# Patient Record
Sex: Female | Born: 1985 | Race: White | Hispanic: No | Marital: Married | Smoking: Never smoker
Health system: Southern US, Community
[De-identification: ages and names within clinical notes are randomized; demographics above are authoritative.]

## PROBLEM LIST (undated history)

## (undated) ENCOUNTER — Emergency Department (HOSPITAL_COMMUNITY): Admission: EM | Payer: BC Managed Care – PPO | Source: Home / Self Care

## (undated) DIAGNOSIS — F1021 Alcohol dependence, in remission: Secondary | ICD-10-CM

## (undated) DIAGNOSIS — J309 Allergic rhinitis, unspecified: Secondary | ICD-10-CM

## (undated) DIAGNOSIS — R509 Fever, unspecified: Secondary | ICD-10-CM

## (undated) DIAGNOSIS — K828 Other specified diseases of gallbladder: Secondary | ICD-10-CM

## (undated) DIAGNOSIS — R519 Headache, unspecified: Secondary | ICD-10-CM

## (undated) DIAGNOSIS — G629 Polyneuropathy, unspecified: Secondary | ICD-10-CM

## (undated) DIAGNOSIS — J4550 Severe persistent asthma, uncomplicated: Secondary | ICD-10-CM

## (undated) DIAGNOSIS — Z86718 Personal history of other venous thrombosis and embolism: Secondary | ICD-10-CM

## (undated) DIAGNOSIS — Z9109 Other allergy status, other than to drugs and biological substances: Secondary | ICD-10-CM

## (undated) DIAGNOSIS — I4581 Long QT syndrome: Secondary | ICD-10-CM

## (undated) DIAGNOSIS — D649 Anemia, unspecified: Secondary | ICD-10-CM

## (undated) DIAGNOSIS — K219 Gastro-esophageal reflux disease without esophagitis: Secondary | ICD-10-CM

## (undated) DIAGNOSIS — I10 Essential (primary) hypertension: Secondary | ICD-10-CM

## (undated) DIAGNOSIS — R51 Headache: Secondary | ICD-10-CM

## (undated) DIAGNOSIS — I5030 Unspecified diastolic (congestive) heart failure: Secondary | ICD-10-CM

## (undated) DIAGNOSIS — Z8719 Personal history of other diseases of the digestive system: Secondary | ICD-10-CM

## (undated) DIAGNOSIS — Z87898 Personal history of other specified conditions: Secondary | ICD-10-CM

## (undated) DIAGNOSIS — Z8619 Personal history of other infectious and parasitic diseases: Secondary | ICD-10-CM

## (undated) HISTORY — DX: Essential (primary) hypertension: I10

## (undated) HISTORY — PX: TYMPANOSTOMY TUBE PLACEMENT: SHX32

## (undated) HISTORY — DX: Headache: R51

## (undated) HISTORY — DX: Headache, unspecified: R51.9

---

## 2014-04-08 ENCOUNTER — Emergency Department (HOSPITAL_COMMUNITY): Payer: BC Managed Care – PPO

## 2014-04-08 ENCOUNTER — Observation Stay (HOSPITAL_COMMUNITY)
Admission: EM | Admit: 2014-04-08 | Discharge: 2014-04-09 | Disposition: A | Discharge status: Home / Self Care | Payer: BC Managed Care – PPO | Attending: Internal Medicine | Admitting: Internal Medicine

## 2014-04-08 ENCOUNTER — Encounter (HOSPITAL_COMMUNITY): Payer: Self-pay | Admitting: *Deleted

## 2014-04-08 DIAGNOSIS — R9431 Abnormal electrocardiogram [ECG] [EKG]: Secondary | ICD-10-CM | POA: Diagnosis present

## 2014-04-08 DIAGNOSIS — R7989 Other specified abnormal findings of blood chemistry: Secondary | ICD-10-CM | POA: Diagnosis not present

## 2014-04-08 DIAGNOSIS — E876 Hypokalemia: Secondary | ICD-10-CM | POA: Diagnosis not present

## 2014-04-08 DIAGNOSIS — F1010 Alcohol abuse, uncomplicated: Secondary | ICD-10-CM | POA: Insufficient documentation

## 2014-04-08 DIAGNOSIS — I4581 Long QT syndrome: Secondary | ICD-10-CM | POA: Diagnosis not present

## 2014-04-08 DIAGNOSIS — R55 Syncope and collapse: Principal | ICD-10-CM | POA: Insufficient documentation

## 2014-04-08 DIAGNOSIS — K219 Gastro-esophageal reflux disease without esophagitis: Secondary | ICD-10-CM | POA: Diagnosis not present

## 2014-04-08 DIAGNOSIS — R000 Tachycardia, unspecified: Secondary | ICD-10-CM | POA: Diagnosis not present

## 2014-04-08 DIAGNOSIS — J45909 Unspecified asthma, uncomplicated: Secondary | ICD-10-CM | POA: Diagnosis not present

## 2014-04-08 DIAGNOSIS — Z87891 Personal history of nicotine dependence: Secondary | ICD-10-CM | POA: Insufficient documentation

## 2014-04-08 DIAGNOSIS — R112 Nausea with vomiting, unspecified: Secondary | ICD-10-CM | POA: Insufficient documentation

## 2014-04-08 DIAGNOSIS — R197 Diarrhea, unspecified: Secondary | ICD-10-CM | POA: Insufficient documentation

## 2014-04-08 DIAGNOSIS — T671XXD Heat syncope, subsequent encounter: Secondary | ICD-10-CM

## 2014-04-08 DIAGNOSIS — R945 Abnormal results of liver function studies: Secondary | ICD-10-CM

## 2014-04-08 HISTORY — DX: Gastro-esophageal reflux disease without esophagitis: K21.9

## 2014-04-08 HISTORY — DX: Hypokalemia: E87.6

## 2014-04-08 HISTORY — DX: Other allergy status, other than to drugs and biological substances: Z91.09

## 2014-04-08 LAB — CBC WITH DIFFERENTIAL/PLATELET
Basophils Absolute: 0.1 10*3/uL (ref 0.0–0.1)
Basophils Relative: 1 % (ref 0–1)
Eosinophils Absolute: 0.1 10*3/uL (ref 0.0–0.7)
Eosinophils Relative: 1 % (ref 0–5)
HCT: 41.9 % (ref 36.0–46.0)
HEMOGLOBIN: 13.8 g/dL (ref 12.0–15.0)
Lymphocytes Relative: 18 % (ref 12–46)
Lymphs Abs: 1.5 10*3/uL (ref 0.7–4.0)
MCH: 31.3 pg (ref 26.0–34.0)
MCHC: 32.9 g/dL (ref 30.0–36.0)
MCV: 95 fL (ref 78.0–100.0)
MONOS PCT: 6 % (ref 3–12)
Monocytes Absolute: 0.5 10*3/uL (ref 0.1–1.0)
Neutro Abs: 6.1 10*3/uL (ref 1.7–7.7)
Neutrophils Relative %: 74 % (ref 43–77)
PLATELETS: 366 10*3/uL (ref 150–400)
RBC: 4.41 MIL/uL (ref 3.87–5.11)
RDW: 15.6 % — ABNORMAL HIGH (ref 11.5–15.5)
WBC: 8.4 10*3/uL (ref 4.0–10.5)

## 2014-04-08 LAB — URINALYSIS, ROUTINE W REFLEX MICROSCOPIC
GLUCOSE, UA: NEGATIVE mg/dL
Ketones, ur: 15 mg/dL — AB
Leukocytes, UA: NEGATIVE
NITRITE: NEGATIVE
PH: 6 (ref 5.0–8.0)
PROTEIN: 100 mg/dL — AB
Specific Gravity, Urine: 1.028 (ref 1.005–1.030)
UROBILINOGEN UA: 1 mg/dL (ref 0.0–1.0)

## 2014-04-08 LAB — TROPONIN I

## 2014-04-08 LAB — POC URINE PREG, ED: Preg Test, Ur: NEGATIVE

## 2014-04-08 LAB — COMPREHENSIVE METABOLIC PANEL
ALK PHOS: 144 U/L — AB (ref 39–117)
ALT: 79 U/L — ABNORMAL HIGH (ref 0–35)
ANION GAP: 22 — AB (ref 5–15)
AST: 250 U/L — ABNORMAL HIGH (ref 0–37)
Albumin: 3.5 g/dL (ref 3.5–5.2)
BUN: 5 mg/dL — AB (ref 6–23)
CO2: 21 mEq/L (ref 19–32)
CREATININE: 0.7 mg/dL (ref 0.50–1.10)
Calcium: 9.2 mg/dL (ref 8.4–10.5)
Chloride: 97 mEq/L (ref 96–112)
GFR calc Af Amer: >90 mL/min (ref >90)
GFR calc non Af Amer: >90 mL/min (ref >90)
Glucose, Bld: 108 mg/dL — ABNORMAL HIGH (ref 70–99)
POTASSIUM: 3.6 meq/L — AB (ref 3.7–5.3)
Sodium: 140 mEq/L (ref 137–147)
TOTAL PROTEIN: 7.6 g/dL (ref 6.0–8.3)
Total Bilirubin: 0.6 mg/dL (ref 0.3–1.2)

## 2014-04-08 LAB — RAPID URINE DRUG SCREEN, HOSP PERFORMED
AMPHETAMINES: NOT DETECTED
Barbiturates: NOT DETECTED
Benzodiazepines: NOT DETECTED
COCAINE: NOT DETECTED
OPIATES: NOT DETECTED
Tetrahydrocannabinol: NOT DETECTED

## 2014-04-08 LAB — MAGNESIUM: Magnesium: 1.5 mg/dL (ref 1.5–2.5)

## 2014-04-08 LAB — URINE MICROSCOPIC-ADD ON

## 2014-04-08 LAB — ETHANOL: Alcohol, Ethyl (B): <11 mg/dL (ref 0–11)

## 2014-04-08 LAB — LIPASE, BLOOD: LIPASE: 44 U/L (ref 11–59)

## 2014-04-08 LAB — PREGNANCY, URINE: PREG TEST UR: NEGATIVE

## 2014-04-08 MED ORDER — MOMETASONE FURO-FORMOTEROL FUM 200-5 MCG/ACT IN AERO
2.0000 | INHALATION_SPRAY | Freq: Two times a day (BID) | RESPIRATORY_TRACT | Status: DC
  Administered 2014-04-08 – 2014-04-09 (×2): 2 via RESPIRATORY_TRACT
  Filled 2014-04-08: qty 8.8

## 2014-04-08 MED ORDER — LORAZEPAM 2 MG/ML IJ SOLN: 1.0000 mg | Freq: Four times a day (QID) | INTRAMUSCULAR | Status: DC | PRN

## 2014-04-08 MED ORDER — PANTOPRAZOLE SODIUM 40 MG PO TBEC: 40.0000 mg | DELAYED_RELEASE_TABLET | Freq: Every day | ORAL | Status: DC

## 2014-04-08 MED ORDER — PNEUMOCOCCAL VAC POLYVALENT 25 MCG/0.5ML IJ INJ
0.5000 mL | INJECTION | INTRAMUSCULAR | Status: AC
  Administered 2014-04-09: 0.5 mL via INTRAMUSCULAR
  Filled 2014-04-08 (×2): qty 0.5

## 2014-04-08 MED ORDER — CHLORHEXIDINE GLUCONATE 0.12 % MT SOLN
15.0000 mL | Freq: Two times a day (BID) | OROMUCOSAL | Status: DC
  Administered 2014-04-08 – 2014-04-09 (×2): 15 mL via OROMUCOSAL
  Filled 2014-04-08 (×4): qty 15

## 2014-04-08 MED ORDER — ALUM & MAG HYDROXIDE-SIMETH 200-200-20 MG/5ML PO SUSP: 30.0000 mL | Freq: Four times a day (QID) | ORAL | Status: DC | PRN

## 2014-04-08 MED ORDER — THIAMINE HCL 100 MG/ML IJ SOLN
100.0000 mg | Freq: Every day | INTRAMUSCULAR | Status: DC
  Filled 2014-04-08 (×2): qty 1

## 2014-04-08 MED ORDER — LORAZEPAM 1 MG PO TABS
0.0000 mg | ORAL_TABLET | Freq: Two times a day (BID) | ORAL | Status: DC
Start: 2014-04-10 — End: 2014-04-09
  Administered 2014-04-08: 2 mg via ORAL

## 2014-04-08 MED ORDER — ACETAMINOPHEN 650 MG RE SUPP: 650.0000 mg | Freq: Four times a day (QID) | RECTAL | Status: DC | PRN

## 2014-04-08 MED ORDER — MAGNESIUM SULFATE 2 GM/50ML IV SOLN
2.0000 g | Freq: Once | INTRAVENOUS | Status: AC
  Administered 2014-04-08: 2 g via INTRAVENOUS
  Filled 2014-04-08: qty 50

## 2014-04-08 MED ORDER — VITAMIN B-1 100 MG PO TABS
100.0000 mg | ORAL_TABLET | Freq: Every day | ORAL | Status: DC
  Administered 2014-04-08 – 2014-04-09 (×2): 100 mg via ORAL
  Filled 2014-04-08 (×2): qty 1

## 2014-04-08 MED ORDER — SODIUM CHLORIDE 0.9 % IV BOLUS (SEPSIS)
1000.0000 mL | Freq: Once | INTRAVENOUS | Status: AC
  Administered 2014-04-08: 1000 mL via INTRAVENOUS

## 2014-04-08 MED ORDER — ALBUTEROL SULFATE (2.5 MG/3ML) 0.083% IN NEBU: 2.5000 mg | INHALATION_SOLUTION | Freq: Four times a day (QID) | RESPIRATORY_TRACT | Status: DC | PRN

## 2014-04-08 MED ORDER — PROMETHAZINE HCL 25 MG/ML IJ SOLN: 12.5000 mg | Freq: Four times a day (QID) | INTRAMUSCULAR | Status: DC | PRN

## 2014-04-08 MED ORDER — HEPARIN SODIUM (PORCINE) 5000 UNIT/ML IJ SOLN
5000.0000 [IU] | Freq: Three times a day (TID) | INTRAMUSCULAR | Status: DC
  Administered 2014-04-08 – 2014-04-09 (×3): 5000 [IU] via SUBCUTANEOUS
  Filled 2014-04-08 (×5): qty 1

## 2014-04-08 MED ORDER — ONDANSETRON HCL 4 MG PO TABS: 4.0000 mg | ORAL_TABLET | Freq: Four times a day (QID) | ORAL | Status: DC | PRN

## 2014-04-08 MED ORDER — CETYLPYRIDINIUM CHLORIDE 0.05 % MT LIQD
7.0000 mL | Freq: Two times a day (BID) | OROMUCOSAL | Status: DC
  Administered 2014-04-09: 7 mL via OROMUCOSAL

## 2014-04-08 MED ORDER — ADULT MULTIVITAMIN W/MINERALS CH
1.0000 | ORAL_TABLET | Freq: Every day | ORAL | Status: DC
  Administered 2014-04-08 – 2014-04-09 (×2): 1 via ORAL
  Filled 2014-04-08 (×2): qty 1

## 2014-04-08 MED ORDER — PANTOPRAZOLE SODIUM 40 MG PO TBEC
80.0000 mg | DELAYED_RELEASE_TABLET | Freq: Every day | ORAL | Status: DC
  Administered 2014-04-09: 80 mg via ORAL
  Filled 2014-04-08: qty 2

## 2014-04-08 MED ORDER — FLUTICASONE PROPIONATE HFA 44 MCG/ACT IN AERO
1.0000 | INHALATION_SPRAY | Freq: Two times a day (BID) | RESPIRATORY_TRACT | Status: DC
  Administered 2014-04-09: 1 via RESPIRATORY_TRACT
  Filled 2014-04-08: qty 10.6

## 2014-04-08 MED ORDER — FOLIC ACID 1 MG PO TABS
1.0000 mg | ORAL_TABLET | Freq: Every day | ORAL | Status: DC
  Administered 2014-04-08 – 2014-04-09 (×2): 1 mg via ORAL
  Filled 2014-04-08 (×2): qty 1

## 2014-04-08 MED ORDER — ONDANSETRON HCL 4 MG/2ML IJ SOLN
4.0000 mg | Freq: Once | INTRAMUSCULAR | Status: AC
  Administered 2014-04-08: 4 mg via INTRAVENOUS
  Filled 2014-04-08: qty 2

## 2014-04-08 MED ORDER — LORAZEPAM 1 MG PO TABS: 1.0000 mg | ORAL_TABLET | Freq: Four times a day (QID) | ORAL | Status: DC | PRN

## 2014-04-08 MED ORDER — MONTELUKAST SODIUM 10 MG PO TABS
10.0000 mg | ORAL_TABLET | Freq: Every day | ORAL | Status: DC
  Administered 2014-04-08: 10 mg via ORAL
  Filled 2014-04-08 (×2): qty 1

## 2014-04-08 MED ORDER — ALBUTEROL SULFATE HFA 108 (90 BASE) MCG/ACT IN AERS: 2.0000 | INHALATION_SPRAY | Freq: Four times a day (QID) | RESPIRATORY_TRACT | Status: DC | PRN

## 2014-04-08 MED ORDER — SODIUM CHLORIDE 0.9 % IJ SOLN
3.0000 mL | Freq: Two times a day (BID) | INTRAMUSCULAR | Status: DC
  Administered 2014-04-08: 3 mL via INTRAVENOUS

## 2014-04-08 MED ORDER — ACETAMINOPHEN 325 MG PO TABS: 650.0000 mg | ORAL_TABLET | Freq: Four times a day (QID) | ORAL | Status: DC | PRN

## 2014-04-08 MED ORDER — CHLORHEXIDINE GLUCONATE 0.12 % MT SOLN
10.0000 mL | Freq: Every day | OROMUCOSAL | Status: DC
  Filled 2014-04-08: qty 15

## 2014-04-08 MED ORDER — PROMETHAZINE HCL 25 MG PO TABS: 12.5000 mg | ORAL_TABLET | Freq: Four times a day (QID) | ORAL | Status: DC | PRN

## 2014-04-08 MED ORDER — LORAZEPAM 2 MG/ML IJ SOLN
1.0000 mg | Freq: Once | INTRAMUSCULAR | Status: AC
  Administered 2014-04-08: 1 mg via INTRAVENOUS
  Filled 2014-04-08: qty 1

## 2014-04-08 MED ORDER — LORAZEPAM 1 MG PO TABS
0.0000 mg | ORAL_TABLET | Freq: Four times a day (QID) | ORAL | Status: DC
  Administered 2014-04-08: 2 mg via ORAL
  Filled 2014-04-08 (×2): qty 2

## 2014-04-08 MED ORDER — SODIUM CHLORIDE 0.9 % IV SOLN: INTRAVENOUS | Status: DC

## 2014-04-08 MED ORDER — POTASSIUM CHLORIDE IN NACL 40-0.9 MEQ/L-% IV SOLN
INTRAVENOUS | Status: DC
  Administered 2014-04-08: 50 mL/h via INTRAVENOUS
  Filled 2014-04-08 (×2): qty 1000

## 2014-04-08 MED ORDER — ONDANSETRON HCL 4 MG/2ML IJ SOLN: 4.0000 mg | Freq: Four times a day (QID) | INTRAMUSCULAR | Status: DC | PRN

## 2014-04-08 NOTE — ED Notes (Signed)
Attempted to call report, RN unavailable.

## 2014-04-08 NOTE — ED Provider Notes (Signed)
CSN: [id number]     Arrival date & time 04/08/14  1315 History   First MD Initiated Contact with Patient 04/08/14 1350     Chief Complaint  Patient presents with  . Loss of Consciousness  . ETOH abuse      (Consider location/radiation/quality/duration/timing/severity/associated sxs/prior Treatment) Patient is a 28 y.o. female presenting with syncope.  Loss of Consciousness Episode history:  Single Most recent episode:  Today Duration: very brief. Timing:  Constant Progression:  Unchanged Chronicity:  Recurrent Context: dehydration   Context: not blood draw, not inactivity, not medication change and not sight of blood   Context comment:  Diarrhea, chronic ETOH abuse, no ETOH today Witnessed: no   Relieved by:  Lying down Worsened by:  Nothing tried Ineffective treatments:  None tried Associated symptoms: nausea and vomiting   Associated symptoms: no anxiety, no diaphoresis, no dizziness, no fever, no focal weakness, no recent fall, no seizures and no shortness of breath     Past Medical History  Diagnosis Date  . Asthma   . GERD (gastroesophageal reflux disease)   . Alcohol abuse    History reviewed. No pertinent past surgical history. History reviewed. No pertinent family history. History  Substance Use Topics  . Smoking status: Former Smoker -- 0.50 packs/day for 2 years    Types: Cigarettes  . Smokeless tobacco: Not on file  . Alcohol Use: Yes     Comment: ETOH abuse, 8 shots/ day   OB History    No data available     Review of Systems  Constitutional: Negative for fever and diaphoresis.  Respiratory: Negative for shortness of breath.   Cardiovascular: Positive for syncope.  Gastrointestinal: Positive for nausea and vomiting.  Neurological: Negative for dizziness, focal weakness and seizures.  All other systems reviewed and are negative.     Allergies  Review of patient's allergies indicates no known allergies.  Home Medications   Prior to  Admission medications   Medication Sig Start Date End Date Taking? Authorizing Provider  albuterol (PROVENTIL HFA;VENTOLIN HFA) 108 (90 BASE) MCG/ACT inhaler Inhale 2 puffs into the lungs every 6 (six) hours as needed for wheezing or shortness of breath.   Yes Historical Provider, MD  beclomethasone (QVAR) 80 MCG/ACT inhaler Inhale 2 puffs into the lungs 2 (two) times daily.   Yes Historical Provider, MD  chlorhexidine (PERIDEX) 0.12 % solution Use as directed 10 mLs in the mouth or throat daily.   Yes Historical Provider, MD  mometasone-formoterol (DULERA) 200-5 MCG/ACT AERO Inhale 2 puffs into the lungs 2 (two) times daily.   Yes Historical Provider, MD  montelukast (SINGULAIR) 10 MG tablet Take 10 mg by mouth at bedtime.   Yes Historical Provider, MD  omeprazole (PRILOSEC) 40 MG capsule Take 40 mg by mouth daily.   Yes Historical Provider, MD   BP 114/82 mmHg  Pulse 137  Temp(Src) 97.8 F (36.6 C) (Oral)  Resp 18  SpO2 100%  LMP 03/18/2014 Physical Exam  Constitutional: She is oriented to person, place, and time. She appears well-developed and well-nourished.  HENT:  Head: Normocephalic and atraumatic.  Right Ear: External ear normal.  Left Ear: External ear normal.  Eyes: Conjunctivae and EOM are normal. Pupils are equal, round, and reactive to light.  Neck: Normal range of motion. Neck supple.  Cardiovascular: Regular rhythm, normal heart sounds and intact distal pulses.  Tachycardia present.   Pulmonary/Chest: Effort normal and breath sounds normal.  Abdominal: Soft. Bowel sounds are normal. There is no  tenderness.  Musculoskeletal: Normal range of motion.  Neurological: She is alert and oriented to person, place, and time.  Skin: Skin is warm and dry.  Vitals reviewed.   ED Course  Procedures (including critical care time) Labs Review Labs Reviewed  CBC WITH DIFFERENTIAL - Abnormal; Notable for the following:    RDW 15.6 (*)    All other components within normal limits   COMPREHENSIVE METABOLIC PANEL - Abnormal; Notable for the following:    Potassium 3.6 (*)    Glucose, Bld 108 (*)    BUN 5 (*)    AST 250 (*)    ALT 79 (*)    Alkaline Phosphatase 144 (*)    Anion gap 22 (*)    All other components within normal limits  LIPASE, BLOOD  ETHANOL  URINALYSIS, ROUTINE W REFLEX MICROSCOPIC  PREGNANCY, URINE  TROPONIN I  POC URINE PREG, ED    Imaging Review Dg Chest 2 View  04/08/2014   CLINICAL DATA:  Syncope.  Dizziness and weakness.  EXAM: CHEST  2 VIEW  COMPARISON:  None.  FINDINGS: The cardiomediastinal contours are normal. The lungs are clear. Pulmonary vasculature is normal. No consolidation, pleural effusion, or pneumothorax. No acute osseous abnormalities are seen.  IMPRESSION: No acute pulmonary process.   Electronically Signed   By: Rubye OaksMelanie  Ehinger M.D.   On: 04/08/2014 14:39     EKG Interpretation   Date/Time:  Monday April 08 2014 13:39:38 EST Ventricular Rate:  131 PR Interval:  136 QRS Duration: 97 QT Interval:  434 QTC Calculation: 641 R Axis:   82 Text Interpretation:  Sinus tachycardia Consider right atrial enlargement  Low voltage, precordial leads Prolonged QT interval No old tracing to  compare Confirmed by Mirian MoGentry, Matthew [phone]([id number]) on 04/08/2014 3:09:40 PM      MDM   Final diagnoses:  Syncope    28 y.o. female with pertinent PMH of chronic etoh use, prior syncope presents with recurrent syncopal event. Patient drinks at least 8 shots of alcohol day, drank her normal amount last night. She normally does not drink in the morning. On arrival the patient is essentially asymptomatic with the exception of feeling tremulous.  Vital signs and physical exam as above. No focal neurodeficits.  Orthostatics negative.  HR still 110-130.  QTC >600.  2 gm mag given.  No response to ativan or 1L NS.  No family ho sudden death.  Admitted in stable condition.    I have reviewed all laboratory and imaging studies if ordered as  above  1. Syncope    2. Prolonged QT     Mirian MoMatthew Gentry, MD 04/08/14 1623

## 2014-04-08 NOTE — H&P (Signed)
History and Physical:    Kara [name] ZOX:[id number] DOB: 02-14-1986 DOA: 04/08/2014  Referring physician: Dr. Littie Deeds PCP: Zelphia Cairo, MD   Chief Complaint: Syncope/diarrhea  History of Present Illness:   Kara [name] is an 28 y.o. female with a PMH of ETOH abuse and asthma who was brought to the hospital by her husband after having a syncopal episode after walking to the bathroom (had some nausea).  She feels like she blacked out for a few seconds.  No prodromal symptoms except for the sudden onset of nausea.  Vomited after coming to.  No complaints of chest pains, shortness of breath or palpitations.  The patient denies any recent medication changes, no recent similar symptoms. There is no family history of sudden death. She does report an episode of hematochezia over a week ago, and has not had any since that time. Upon initial evaluation in the ED, the patient's heart rate is 110-130 with a QTC greater than 600. Hemoglobin is normal at 13.8.  ROS:   Constitutional: + fever, + chills;  Appetite diminished; No weight loss, no weight gain, + fatigue.  HEENT: No blurry vision, no diplopia, no pharyngitis, no dysphagia CV: No chest pain, no palpitations, no PND, no orthopnea, + edema.  Resp: No SOB, no cough, no pleuritic pain. GI: + nausea, + vomiting, no diarrhea, no melena, + hematochezia last week (none now), no constipation, no abdominal pain.  GU: No dysuria, no hematuria, no frequency, no urgency. MSK: no myalgias, no arthralgias, +leg pain.  Neuro:  No headache, no focal neurological deficits, no history of seizures.  Psych: No depression, + anxiety.  Endo: No heat intolerance, no cold intolerance, no polyuria, no polydipsia  Skin: No rashes, no skin lesions.  Heme: + easy bruising.  Travel history: No recent travel except to [place], [place] and [place].   Past Medical History:   Past Medical History  Diagnosis Date  . Asthma   . GERD (gastroesophageal reflux disease)   . Alcohol abuse     . Environmental allergies     Past Surgical History:   Past Surgical History  Procedure Laterality Date  . Tympanostomy tube placement      Social History:   History   Social History  . Marital Status: Married    Spouse Name: [name]    Number of Children: 0  . Years of Education: N/A   Occupational History  . College advisor    Social History Main Topics  . Smoking status: Former Smoker -- 0.50 packs/day for 2 years    Types: Cigarettes    Quit date: 04/08/2012  . Smokeless tobacco: Never Used  . Alcohol Use: 0.0 oz/week    0 Not specified per week     Comment: ETOH abuse, 8 shots/ day  . Drug Use: No  . Sexual Activity: Yes    Birth Control/ Protection: None   Other Topics Concern  . Not on file   Social History Narrative   Married.    Family history:   Family History  Problem Relation Age of Onset  . Heart failure Mother   . Heart failure Father   . Heart failure Maternal Grandfather     Allergies   Review of patient's allergies indicates no known allergies.  Current Medications:   Prior to Admission medications   Medication Sig Start Date End Date Taking? Authorizing Provider  albuterol (PROVENTIL HFA;VENTOLIN HFA) 108 (90 BASE) MCG/ACT inhaler Inhale 2 puffs into the lungs every 6 (six)  hours as needed for wheezing or shortness of breath.   Yes Historical Provider, MD  beclomethasone (QVAR) 80 MCG/ACT inhaler Inhale 2 puffs into the lungs 2 (two) times daily.   Yes Historical Provider, MD  chlorhexidine (PERIDEX) 0.12 % solution Use as directed 10 mLs in the mouth or throat daily.   Yes Historical Provider, MD  mometasone-formoterol (DULERA) 200-5 MCG/ACT AERO Inhale 2 puffs into the lungs 2 (two) times daily.   Yes Historical Provider, MD  montelukast (SINGULAIR) 10 MG tablet Take 10 mg by mouth at bedtime.   Yes Historical Provider, MD  omeprazole (PRILOSEC) 40 MG capsule Take 40 mg by mouth daily.   Yes Historical Provider, MD    Physical  Exam:   Filed Vitals:   04/08/14 1400 04/08/14 1530 04/08/14 1604 04/08/14 1630  BP: 160/106 134/76 114/82 138/86  Pulse: 125 120 137 131  Temp:      TempSrc:      Resp: 14 18 18 22   SpO2: 99% 100% 100% 100%     Physical Exam: Blood pressure 138/86, pulse 131, temperature 97.8 F (36.6 C), temperature source Oral, resp. rate 22, last menstrual period 03/18/2014, SpO2 100 %. Gen: No acute distress. Head: Normocephalic, atraumatic. Eyes: PERRL, EOMI, sclerae nonicteric. Mouth: Oropharynx reveals dry mucous membranes and a white coating to the tongue. Tonsillar enlargement noted. Neck: Supple, no thyromegaly, no lymphadenopathy, no jugular venous distention. Chest: Lungs clear to auscultation bilaterally. CV: Heart sounds are tachycardic, regular, without obvious murmur. Abdomen: Soft, nontender, nondistended with normal active bowel sounds. Extremities: Extremities are without clubbing, edema, or cyanosis. Skin: Warm and dry. Tattoos present. Neuro: Alert and oriented times 3; cranial nerves II through XII grossly intact. Psych: Mood and affect mildly anxious.   Data Review:    Labs: Basic Metabolic Panel:  Recent Labs Lab 04/08/14 1446  NA 140  K 3.6*  CL 97  CO2 21  GLUCOSE 108*  BUN 5*  CREATININE 0.70  CALCIUM 9.2   Liver Function Tests:  Recent Labs Lab 04/08/14 1446  AST 250*  ALT 79*  ALKPHOS 144*  BILITOT 0.6  PROT 7.6  ALBUMIN 3.5    Recent Labs Lab 04/08/14 1446  LIPASE 44   CBC:  Recent Labs Lab 04/08/14 1446  WBC 8.4  NEUTROABS 6.1  HGB 13.8  HCT 41.9  MCV 95.0  PLT 366   Cardiac Enzymes:  Recent Labs Lab 04/08/14 1445  TROPONINI <0.30    Radiographic Studies: Dg Chest 2 View  04/08/2014   CLINICAL DATA:  Syncope.  Dizziness and weakness.  EXAM: CHEST  2 VIEW  COMPARISON:  None.  FINDINGS: The cardiomediastinal contours are normal. The lungs are clear. Pulmonary vasculature is normal. No consolidation, pleural  effusion, or pneumothorax. No acute osseous abnormalities are seen.  IMPRESSION: No acute pulmonary process.   Electronically Signed   By: Rubye OaksMelanie  Ehinger M.D.   On: 04/08/2014 14:39    EKG: Independently reviewed. Sinus tachycardia with a heart rate of 131 bpm. Prolonged QT interval greater than 600 ms. No old tracing to compare   Assessment/Plan:   Principal Problem:   Syncope / Prolonged Q-T interval on ECG  Admit to telemetry and monitor for arrhythmias.  Pharmacy consultation to evaluate for medication interactions that may prolong QT interval.  Check orthostatics.  Check urine drug screen.  Check serum magnesium. Given magnesium sulfate by ED physician.  Active Problems:   Possible thrush  Check HIV status.    Hypokalemia  Potassium  added to IV fluids.    Alcohol abuse  Patient denies a history of withdrawal.  Placed on Ativan detox CIWA protocol.  Supplement thiamine and folic acid.    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  Check acute hepatitis serologies.    DVT prophylaxis  Subcutaneous heparin ordered.  Code Status: Full. Family Communication: Arville Goathaniel Ladson, husband, at bedside. Disposition Plan: Home when stable.  Time spent: 1 hour.  Janit Cutter [facility] Pager [phone] Cell: [phone]   If 7PM-7AM, please contact night-coverage [email or web address] Password [email or web address] 04/08/2014, 5:05 PM

## 2014-04-08 NOTE — Progress Notes (Signed)
MEDICATION RELATED CONSULT NOTE - INITIAL   Pharmacy Consult for evaluate medication possibly causing QT prolongation  No Known Allergies  Patient Measurements:   Adjusted Body Weight:   Vital Signs: Temp: 97.8 F (36.6 C) (12/21 1330) Temp Source: Oral (12/21 1330) BP: 138/86 mmHg (12/21 1630) Pulse Rate: 131 (12/21 1630) Intake/Output from previous day:   Intake/Output from this shift:    Labs:  Recent Labs  04/08/14 1446  WBC 8.4  HGB 13.8  HCT 41.9  PLT 366  CREATININE 0.70  ALBUMIN 3.5  PROT 7.6  AST 250*  ALT 79*  ALKPHOS 144*  BILITOT 0.6   CrCl cannot be calculated (Unknown ideal weight.).   Microbiology: No results found for this or any previous visit (from the past 720 hour(s)).  Medical History: Past Medical History  Diagnosis Date  . Asthma   . GERD (gastroesophageal reflux disease)   . Alcohol abuse   . Environmental allergies     Assessment: [age] YOF presents with syncope and ED EKG with QTc  > 600ms.    Goal of Therapy:  Notify physician for meds that can prolong QTc  Plan:   Notify admitting physician that ondansetron can prolong QTc and consider alternative agent as appropriate  Juliette Alcideustin Charde Macfarlane, PharmD, BCPS.   Pager: [phone]  04/08/2014,5:35 PM

## 2014-04-08 NOTE — ED Notes (Addendum)
Pt reports ETOH abuse, drinks 8 shots/day x3 years. Last drink last night. Pt reports she had LOC today. Pt has hx of asthma, GERD, usually vomits in the morning, her doctor knows about this. This is not new. Pt has seen pcp about feet swelling since Sept, was given "fluid pills" which she no longer takes. Pt reports she started having tingling in  Her feet and legs x1 week. Denies pain. P

## 2014-04-09 DIAGNOSIS — R55 Syncope and collapse: Secondary | ICD-10-CM

## 2014-04-09 DIAGNOSIS — J45909 Unspecified asthma, uncomplicated: Secondary | ICD-10-CM | POA: Diagnosis not present

## 2014-04-09 DIAGNOSIS — F1010 Alcohol abuse, uncomplicated: Secondary | ICD-10-CM | POA: Diagnosis not present

## 2014-04-09 DIAGNOSIS — I4581 Long QT syndrome: Secondary | ICD-10-CM | POA: Diagnosis not present

## 2014-04-09 LAB — COMPREHENSIVE METABOLIC PANEL
ALBUMIN: 3.8 g/dL (ref 3.5–5.2)
ALT: 59 U/L — ABNORMAL HIGH (ref 0–35)
AST: 194 U/L — ABNORMAL HIGH (ref 0–37)
Alkaline Phosphatase: 135 U/L — ABNORMAL HIGH (ref 39–117)
Anion gap: 9 (ref 5–15)
BUN: 6 mg/dL (ref 6–23)
CALCIUM: 8.9 mg/dL (ref 8.4–10.5)
CO2: 26 mmol/L (ref 19–32)
CREATININE: 0.78 mg/dL (ref 0.50–1.10)
Chloride: 104 mEq/L (ref 96–112)
GFR calc Af Amer: >90 mL/min (ref >90)
GFR calc non Af Amer: >90 mL/min (ref >90)
Glucose, Bld: 100 mg/dL — ABNORMAL HIGH (ref 70–99)
Potassium: 3.3 mmol/L — ABNORMAL LOW (ref 3.5–5.1)
Sodium: 139 mmol/L (ref 135–145)
Total Bilirubin: 1.5 mg/dL — ABNORMAL HIGH (ref 0.3–1.2)
Total Protein: 7.5 g/dL (ref 6.0–8.3)

## 2014-04-09 LAB — CBC
HEMATOCRIT: 42.3 % (ref 36.0–46.0)
Hemoglobin: 13.7 g/dL (ref 12.0–15.0)
MCH: 31.3 pg (ref 26.0–34.0)
MCHC: 32.4 g/dL (ref 30.0–36.0)
MCV: 96.6 fL (ref 78.0–100.0)
PLATELETS: 332 10*3/uL (ref 150–400)
RBC: 4.38 MIL/uL (ref 3.87–5.11)
RDW: 15.7 % — AB (ref 11.5–15.5)
WBC: 7.2 10*3/uL (ref 4.0–10.5)

## 2014-04-09 LAB — VITAMIN B12: Vitamin B-12: 622 pg/mL (ref 211–911)

## 2014-04-09 LAB — HIV ANTIBODY (ROUTINE TESTING W REFLEX): HIV: NONREACTIVE

## 2014-04-09 MED ORDER — POTASSIUM CHLORIDE CRYS ER 20 MEQ PO TBCR
40.0000 meq | EXTENDED_RELEASE_TABLET | Freq: Once | ORAL | Status: AC
  Administered 2014-04-09: 40 meq via ORAL
  Filled 2014-04-09: qty 2

## 2014-04-09 NOTE — Progress Notes (Signed)
PT Cancellation Note  Patient Details Name: Kara [name] MRN: [id number] DOB: 10/10/1985   Cancelled Treatment:    Reason Eval/Treat Not Completed: PT screened, no needs identified, will sign off Pt reports no problems with mobility, up to bathroom without difficulty.  Pt does reports LE weakness however states has had this for a few months and possibly due to B12 vitamin deficiency.  Recommended pt return to MD if weakness continues after d/c.  Since pt declines PT at this time, will sign off.   Sircharles Holzheimer,KATHrine E 04/09/2014, 2:12 PM Zenovia JarredKati Elnora Quizon, PT, DPT 04/09/2014 Pager: [phone]

## 2014-04-09 NOTE — Progress Notes (Signed)
UR completed 

## 2014-04-09 NOTE — Progress Notes (Signed)
Patient given discharge instructions, and verbalized an understanding of all discharge instructions.  Patient agrees with discharge plan, and is being discharged in stable medical condition.  Patient ambulatory, and expressed desire to walk out with significant other.  Philomena Dohenyavid Eldene Plocher RN

## 2014-04-09 NOTE — Progress Notes (Signed)
Clinical Social Work  CSW received referral to assess for substance use. CSW went to room but patient currently in the bathroom. CSW will follow up at later time.  [place]ake SecessionHolly Raymone [place]roke, KentuckyLCSW [phone]

## 2014-04-09 NOTE — Discharge Instructions (Signed)
Near-Syncope °Near-syncope (commonly known as near fainting) is sudden weakness, dizziness, or feeling like you might pass out. This can happen when getting up or while standing for a long time. It is caused by a sudden decrease in blood flow to the brain, which can occur for various reasons. Most of the reasons are not serious.  °HOME CARE °Watch your condition for any changes. °· Have someone stay with you until you feel stable. °· If you feel like you are going to pass out: °¨ Lie down right away. °¨ Prop your feet up if you can. °¨ Breathe deeply and steadily. °¨ Move only when the feeling has gone away. Most of the time, this feeling lasts only a few minutes. You may feel tired for several hours. °· Drink enough fluids to keep your pee (urine) clear or pale yellow. °· If you are taking blood pressure or heart medicine, stand up slowly. °· Follow up with your doctor as told. °GET HELP RIGHT AWAY IF:  °· You have a severe headache. °· You have unusual pain in the chest, belly (abdomen), or back. °· You have bleeding from the mouth or butt (rectum), or you have black or tarry poop (stool). °· You feel your heart beat differently than normal, or you have a very fast pulse. °· You pass out, or you twitch and shake when you pass out. °· You pass out when sitting or lying down. °· You feel confused. °· You have trouble walking. °· You are weak. °· You have vision problems. °MAKE SURE YOU:  °· Understand these instructions. °· Will watch your condition. °· Will get help right away if you are not doing well or get worse. °Document Released: 09/22/2007 Document Revised: 04/10/2013 Document Reviewed: 09/08/2012 °[employer]® Patient Information ©2015 [employer]. This information is not intended to replace advice given to you by your health care provider. Make sure you discuss any questions you have with your health care provider. ° °

## 2014-04-09 NOTE — Discharge Summary (Signed)
Physician Discharge Summary  Kara [name] WUJ:[id number] DOB: 05/15/1985 DOA: 04/08/2014  PCP: Zelphia CairoADKINS,GRETCHEN, MD  Admit date: 04/08/2014 Discharge date: 04/09/2014  Recommendations for Outpatient Follow-up:  1. Pt will need to follow up with PCP in 2-3 weeks post discharge 2. Please obtain BMP to evaluate electrolytes and kidney function 3. Please also check CBC to evaluate Hg and Hct levels 4. Please note that pt insisted on going home today prior to having vit b 12 level back 5. Follow up on vit b12 and vit d level and supplement as indicated  6. Also please note that pt advised to stop drinking as this was likely causing some dizziness 7. Also pt with numbness and tingling which could be related to her alcohol use   Discharge Diagnoses:  Principal Problem:   Syncope Active Problems:   Prolonged Q-T interval on ECG   Hypokalemia   Alcohol abuse   Elevated LFTs  Discharge Condition: Stable  Diet recommendation: Heart healthy diet discussed in details   History of present illness:  28 y.o. female with a PMH of ETOH abuse and asthma who was brought to the hospital by her husband after having a syncopal episode after walking to the bathroom (had some nausea). She feels like she blacked out for a few seconds. No prodromal symptoms except for the sudden onset of nausea. Vomited after coming to the ED. No complaints of chest pains, shortness of breath or palpitations. The patient denies any recent medication changes, no recent similar symptoms. There is no family history of sudden death. She does report an episode of hematochezia over a week ago, and has not had any since that time. Upon initial evaluation in the ED, the patient's heart rate is 110-130 with a QTC greater than 600. Hemoglobin is normal at 13.8.  Hospital Course:  Principal Problem:   Syncope - unclear if related to prolonged QTc vs alcohol use  - pt ambulating in hallway with no problems - wants to go home -  referral to [facility] provided  Active Problems:   Prolonged Q-T interval on ECG - pt taking only zofran which could have prolonged QTc - pharmacy reviewed medications that pt takes at home and no other meds identified as potential contributor to prolonged QTc - this AM QTC in 400 - pt made aware of the risks associated with this and still wants to go home  - no other events on tele   Hypokalemia - mild, supplemented prior to discharge    Alcohol abuse - consultation on cessation provided    Elevated LFTs - from alcohol use - pt made aware - hep panel pending   Procedures/Studies: Dg Chest 2 View  04/08/2014  No acute pulmonary process.    Consultations:  None Antibiotics:  None   Discharge Exam: Filed Vitals:   04/09/14 1440  BP: 127/75  Pulse: 91  Temp: 98.7 F (37.1 C)  Resp: 20   Filed Vitals:   04/08/14 2240 04/09/14 0526 04/09/14 1101 04/09/14 1440  BP: 136/82 133/90  127/75  Pulse: 117 105  91  Temp: 98.7 F (37.1 C) 98.3 F (36.8 C)  98.7 F (37.1 C)  TempSrc: Oral Oral  Oral  Resp: 20 18  20   SpO2: 100% 100% 100% 100%    General: Pt is alert, follows commands appropriately, not in acute distress Cardiovascular: Regular rate and rhythm, S1/S2 +, no murmurs, no rubs, no gallops Respiratory: Clear to auscultation bilaterally, no wheezing, no crackles, no rhonchi Abdominal:  Soft, non tender, non distended, bowel sounds +, no guarding Extremities: no edema, no cyanosis, pulses palpable bilaterally DP and PT Neuro: Grossly nonfocal  Discharge Instructions  Discharge Instructions    Diet - low sodium heart healthy    Complete by:  As directed      Increase activity slowly    Complete by:  As directed             Medication List    TAKE these medications        albuterol 108 (90 BASE) MCG/ACT inhaler  Commonly known as:  PROVENTIL HFA;VENTOLIN HFA  Inhale 2 puffs into the lungs every 6 (six) hours as needed for wheezing  or shortness of breath.     chlorhexidine 0.12 % solution  Commonly known as:  PERIDEX  Use as directed 10 mLs in the mouth or throat daily.     DULERA 200-5 MCG/ACT Aero  Generic drug:  mometasone-formoterol  Inhale 2 puffs into the lungs 2 (two) times daily.     montelukast 10 MG tablet  Commonly known as:  SINGULAIR  Take 10 mg by mouth at bedtime.     omeprazole 40 MG capsule  Commonly known as:  PRILOSEC  Take 40 mg by mouth daily.     QVAR 80 MCG/ACT inhaler  Generic drug:  beclomethasone  Inhale 2 puffs into the lungs 2 (two) times daily.           Follow-up Information    Follow up with ADKINS,GRETCHEN, MD.   Specialty:  Obstetrics and Gynecology   Contact information:   [address] [place], [address] [place] [address] [phone]       Follow up with [facility].   Contact information:   [address] [place] [address] [phone]      Follow up with Debbora Presto, MD.   Specialty:  Internal Medicine   Why:  As needed, If symptoms worsen   Contact information:   [address] [place] [address] [phone]        The results of significant diagnostics from this hospitalization (including imaging, microbiology, ancillary and laboratory) are listed below for reference.     Microbiology: No results found for this or any previous visit (from the past 240 hour(s)).   Labs: Basic Metabolic Panel:  Recent Labs Lab 04/08/14 1446 04/08/14 1800 04/09/14 1015  NA 140  --  139  K 3.6*  --  3.3*  CL 97  --  104  CO2 21  --  26  GLUCOSE 108*  --  100*  BUN 5*  --  6  CREATININE 0.70  --  0.78  CALCIUM 9.2  --  8.9  MG  --  1.5  --    Liver Function Tests:  Recent Labs Lab 04/08/14 1446 04/09/14 1015  AST 250* 194*  ALT 79* 59*  ALKPHOS 144* 135*  BILITOT 0.6 1.5*  PROT 7.6 7.5  ALBUMIN 3.5 3.8    Recent Labs Lab 04/08/14 1446  LIPASE 44    CBC:  Recent Labs Lab 04/08/14 1446 04/09/14 1015  WBC 8.4 7.2  NEUTROABS 6.1  --   HGB 13.8 13.7  HCT 41.9 42.3  MCV 95.0 96.6  PLT 366 332   Cardiac Enzymes:  Recent Labs Lab 04/08/14 1445  TROPONINI <0.30     SIGNED: Time coordinating discharge: Over 30 minutes  Debbora Presto, MD  [facility] 04/09/2014, 3:18 PM  Pager [phone]  If 7PM-7AM, please contact night-coverage [email or web address] Password [email or web address]

## 2014-04-12 LAB — VITAMIN D 1,25 DIHYDROXY
VITAMIN D 1, 25 (OH) TOTAL: 69 pg/mL (ref 18–72)
VITAMIN D3 1, 25 (OH): 69 pg/mL

## 2014-05-03 LAB — HEPATITIS PANEL, ACUTE
HCV AB: NEGATIVE
HEP A IGM: NONREACTIVE
HEP B S AG: NEGATIVE
Hep B C IgM: NONREACTIVE

## 2014-06-10 NOTE — Progress Notes (Signed)
Patient ID: Kara [name], female   DOB: Oct 11, 1985, 29 y.o.   MRN: [id number]     Cardiology Office Note   Date:  06/10/2014   ID:  Nishat Livingston, DOB May 11, 1985, MRN [id number]  PCP:  Zelphia Cairo, MD  Cardiologist:   Charlton Haws, MD   No chief complaint on file.     History of Present Illness: Kara [name] is a 29 y.o. female who presents for palpitations and syncope.  Seen in hospital 12/15  PMH of ETOH abuse and asthma who was brought to the hospital by her husband after having a syncopal episode after walking to the bathroom (had some nausea). She feels like she blacked out for a few seconds. No prodromal symptoms except for the sudden onset of nausea. Vomited after coming to the ED. No complaints of chest pains, shortness of breath or palpitations. The patient denies any recent medication changes, no recent similar symptoms. There is no family history of sudden death. She does report an episode of hematochezia over a week ago, and has not had any since that time. Upon initial evaluation in the ED, the patient's heart rate is 110-130 with a QTC greater than 600. Hemoglobin is normal at 13.8.  She has a very fragile social situation Father was and alcoholic and she is Has been to AA before  Drinks beer and whiskey.  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  He is a heroin addict.  She has seen a psychiatrist in past but not actively now.     Has reflux at at night and eats her major meal then  Related to palpitations   History of CAD in Dad and ? Sister but no sudden death.      Past Medical History  Diagnosis Date  . Asthma   . GERD (gastroesophageal reflux disease)   . Alcohol abuse   . Environmental allergies     Past Surgical History  Procedure Laterality Date  . Tympanostomy tube placement       Current Outpatient Prescriptions  Medication Sig Dispense Refill  .  albuterol (PROVENTIL HFA;VENTOLIN HFA) 108 (90 BASE) MCG/ACT inhaler Inhale 2 puffs into the lungs every 6 (six) hours as needed for wheezing or shortness of breath.    . beclomethasone (QVAR) 80 MCG/ACT inhaler Inhale 2 puffs into the lungs 2 (two) times daily.    . chlorhexidine (PERIDEX) 0.12 % solution Use as directed 10 mLs in the mouth or throat daily.    . mometasone-formoterol (DULERA) 200-5 MCG/ACT AERO Inhale 2 puffs into the lungs 2 (two) times daily.    . montelukast (SINGULAIR) 10 MG tablet Take 10 mg by mouth at bedtime.    Marland Kitchen omeprazole (PRILOSEC) 40 MG capsule Take 40 mg by mouth daily.     No current facility-administered medications for this visit.    Allergies:   Review of patient's allergies indicates no known allergies.    Social History:  The patient  reports that she quit smoking about 2 years ago. Her smoking use included Cigarettes. She has a 1 pack-year smoking history. She has never used smokeless tobacco. She reports that she drinks alcohol. She reports that she does not use illicit drugs.   Family History:  The patient's family history includes Heart failure in her father, maternal grandfather, and mother.    ROS:  Please see the history of present illness.  Otherwise, review of systems are positive for none.   All other systems are reviewed and negative.    PHYSICAL EXAM: VS:  There were no vitals taken for this visit. , BMI There is no height or weight on file to calculate BMI. GEN: Well nourished, well developed, in no acute distress HEENT: normal Neck: no JVD, carotid bruits, or masses Cardiac:  RRR; no murmurs, rubs, or gallops,no edema  Respiratory:  clear to auscultation bilaterally, normal work of breathing GI: soft, nontender, nondistended, + BS MS: no deformity or atrophy Skin: warm and dry, no rash Neuro:  Strength and sensation are intact Psych: euthymic mood, full affect   EKG:  12/ 21/15  ST rate 131  QT 434   12/22  SR rate 92  QT 412   Normal    Recent Labs: 04/08/2014: Magnesium 1.5 04/09/2014: ALT 59*; BUN 6; Creatinine 0.78; Hemoglobin 13.7; Platelets 332; Potassium 3.3*; Sodium 139    Lipid Panel No results found for: CHOL, TRIG, HDL, CHOLHDL, VLDL, LDLCALC, LDLDIRECT    Wt Readings from Last 3 Encounters:  No data found for Wt      Other studies Reviewed: Additional studies/ records that were reviewed today include: Hosptial admission records, ECG;s 12/15 .    ASSESSMENT AND PLAN:  1.  Syncope:  Etiology not clear.  I think the QT issue is interesting ECG today showed NSR QT 426/503.  Will order echo and ETT to see if she has structurally normal  Heart and no exercised induced arrhythmia.  On no QT prolonging drugs. Event monitor since she complains of nightly palpitations 3-4x/week 2. ETOH Abuse  Long discussion about possible effects on heart [facility] before and encouraged her to reconnect through her primary Did give her a script for xanax short term 3. Asthma  PRN inhaler may be best to change to xopenex for palpitations 4. Reflux:  Discussed low carb diet eating meals earlier and blocks under bed   Current medicines are reviewed at length with the patient today.  The patient does not have concerns regarding medicines.  The following changes have been made:  no change  Labs/ tests ordered today include:  ETT, Echo Event monitor    No orders of the defined types were placed in this encounter.     Disposition:   FU with me after testing      Signed, Charlton HawsPeter Carin Shipp, MD  06/10/2014 8:34 AM    [facility] [address], [place], [place]  [address] Phone: [phone]; Fax: [phone]

## 2014-06-11 ENCOUNTER — Encounter: Payer: Self-pay | Admitting: Cardiovascular Disease

## 2014-06-11 ENCOUNTER — Ambulatory Visit (INDEPENDENT_AMBULATORY_CARE_PROVIDER_SITE_OTHER): Payer: BC Managed Care – PPO | Admitting: Cardiovascular Disease

## 2014-06-11 VITALS — BP 130/78 | HR 88 | Resp 12 | Ht 65.0 in | Wt 218.8 lb

## 2014-06-11 DIAGNOSIS — T671XXD Heat syncope, subsequent encounter: Secondary | ICD-10-CM

## 2014-06-11 DIAGNOSIS — R9431 Abnormal electrocardiogram [ECG] [EKG]: Secondary | ICD-10-CM

## 2014-06-11 DIAGNOSIS — I4581 Long QT syndrome: Secondary | ICD-10-CM

## 2014-06-11 MED ORDER — ALPRAZOLAM 0.25 MG PO TABS: 0.2500 mg | ORAL_TABLET | Freq: Two times a day (BID) | ORAL | Status: DC | PRN

## 2014-06-11 NOTE — Patient Instructions (Signed)
Your physician recommends that you schedule a follow-up appointment in: NEXT AVAILABLE [facility]  [facility] Your physician has recommended you make the following change in your medication: GEN  XANAX 0.25 MG  EVERY   12 HOURS  AS NEEDED Your physician has requested that you have an echocardiogram. Echocardiography is a painless test that uses sound waves to create images of your heart. It provides your doctor with information about the size and shape of your heart and how well your heart's chambers and valves are working. This procedure takes approximately one hour. There are no restrictions for this procedure. Your physician has recommended that you wear an event monitor. Event monitors are medical devices that record the heart's electrical activity. Doctors most often us these monitors to diagnose arrhythmias. Arrhythmias are problems with the speed or rhythm of the heartbeat. The monitor is a small, portable device. You can wear one while you do your normal daily activities. This is usually used to diagnose what is causing palpitations/syncope (passing out).   Your physician has requested that you have an exercise tolerance test. For further information please visit [email or web address]. Please also follow instruction sheet, as given.

## 2014-06-12 ENCOUNTER — Encounter: Payer: Self-pay | Admitting: *Deleted

## 2014-06-12 ENCOUNTER — Encounter (INDEPENDENT_AMBULATORY_CARE_PROVIDER_SITE_OTHER): Payer: BC Managed Care – PPO

## 2014-06-12 DIAGNOSIS — R55 Syncope and collapse: Secondary | ICD-10-CM | POA: Diagnosis not present

## 2014-06-12 DIAGNOSIS — I4581 Long QT syndrome: Secondary | ICD-10-CM

## 2014-06-12 DIAGNOSIS — T671XXD Heat syncope, subsequent encounter: Secondary | ICD-10-CM

## 2014-06-12 NOTE — Progress Notes (Signed)
Patient ID: Kara [name], female   DOB: 03/30/1986, 29 y.o.   MRN: [id number] Prevent[employer] verite 30 day cardiac event monitor applied to patient.

## 2014-06-14 ENCOUNTER — Telehealth: Payer: Self-pay | Admitting: *Deleted

## 2014-06-14 ENCOUNTER — Encounter: Payer: Self-pay | Admitting: *Deleted

## 2014-06-14 NOTE — Telephone Encounter (Signed)
Received event monitor serious notification showing ST w HR of 148, noted on 06/14/14 at 8:13 am. See documentation from earlier this morning stating patient getting ready for work at this time, stressful time for her.

## 2014-06-14 NOTE — Telephone Encounter (Signed)
Received event monitor serious notification showing SVT w HR of 160, noted on 06/13/14 at 7:33 am. (reviewed by DOD - Dr. Delton SeeNelson, no orders received) [email or web address]

## 2014-06-14 NOTE — Telephone Encounter (Signed)
This encounter was created in error - please disregard.

## 2014-06-14 NOTE — Telephone Encounter (Signed)
Patient tells me she was getting ready for work around that time. She states getting ready and trying to get to work is a stressful time for her. She denies any symptoms or feeling increased heart rate. Patient informed we will continue to monitor and she is to call if symptoms arise. She is agreeable.

## 2014-06-17 ENCOUNTER — Telehealth: Payer: Self-pay | Admitting: *Deleted

## 2014-06-17 ENCOUNTER — Ambulatory Visit (HOSPITAL_COMMUNITY): Payer: BC Managed Care – PPO | Attending: Cardiovascular Disease | Admitting: Cardiology

## 2014-06-17 DIAGNOSIS — X58XXXD Exposure to other specified factors, subsequent encounter: Secondary | ICD-10-CM | POA: Insufficient documentation

## 2014-06-17 DIAGNOSIS — T671XXD Heat syncope, subsequent encounter: Secondary | ICD-10-CM | POA: Insufficient documentation

## 2014-06-17 DIAGNOSIS — R55 Syncope and collapse: Secondary | ICD-10-CM

## 2014-06-17 MED ORDER — METOPROLOL SUCCINATE ER 25 MG PO TB24: 25.0000 mg | ORAL_TABLET | Freq: Every day | ORAL | Status: DC

## 2014-06-17 NOTE — Progress Notes (Signed)
Echo performed. 

## 2014-06-17 NOTE — Telephone Encounter (Signed)
PT NOTIFIED TO START   TOPROL  25 MG  EVERY DAY    AND  WILL GET  F/U APPT  WITH EP PER  DR Eden EmmsNISHAN .[name]/CY

## 2014-06-24 ENCOUNTER — Telehealth: Payer: Self-pay | Admitting: Cardiovascular Disease

## 2014-06-24 NOTE — Telephone Encounter (Signed)
New Message  Pt has to resch stress test to April. Has sched f/u w/ Nishan on 3/17 does she need to resch this to after ETT. Please call back and discuss.

## 2014-06-24 NOTE — Telephone Encounter (Signed)
LMTCB

## 2014-06-25 NOTE — Telephone Encounter (Signed)
LM TO CALL BACK ./CY 

## 2014-06-25 NOTE — Telephone Encounter (Signed)
Forward to Dr. Eden EmmsNishan and Scherrie Batemanhristine York,

## 2014-06-25 NOTE — Telephone Encounter (Signed)
Yes should have stress test then f/u with me

## 2014-07-01 NOTE — Telephone Encounter (Signed)
UNABLE TO  LEAVE  MESSAGE  WILL AWAIT  RETURN  CALL  FROM PT  .Kara [name]/CY

## 2014-07-04 ENCOUNTER — Ambulatory Visit: Payer: BC Managed Care – PPO | Admitting: Cardiovascular Disease

## 2014-07-05 ENCOUNTER — Other Ambulatory Visit: Payer: Self-pay

## 2014-07-05 ENCOUNTER — Other Ambulatory Visit (HOSPITAL_BASED_OUTPATIENT_CLINIC_OR_DEPARTMENT_OTHER): Payer: Self-pay

## 2014-07-05 ENCOUNTER — Inpatient Hospital Stay (HOSPITAL_BASED_OUTPATIENT_CLINIC_OR_DEPARTMENT_OTHER)
Admission: EM | Admit: 2014-07-05 | Discharge: 2014-07-06 | DRG: 641 | Disposition: A | Discharge status: Home / Self Care | Payer: BC Managed Care – PPO | Attending: Internal Medicine | Admitting: Internal Medicine

## 2014-07-05 ENCOUNTER — Encounter (HOSPITAL_BASED_OUTPATIENT_CLINIC_OR_DEPARTMENT_OTHER): Payer: Self-pay

## 2014-07-05 DIAGNOSIS — Z87891 Personal history of nicotine dependence: Secondary | ICD-10-CM

## 2014-07-05 DIAGNOSIS — R112 Nausea with vomiting, unspecified: Secondary | ICD-10-CM | POA: Diagnosis not present

## 2014-07-05 DIAGNOSIS — I5032 Chronic diastolic (congestive) heart failure: Secondary | ICD-10-CM | POA: Diagnosis present

## 2014-07-05 DIAGNOSIS — K567 Ileus, unspecified: Secondary | ICD-10-CM

## 2014-07-05 DIAGNOSIS — I4581 Long QT syndrome: Secondary | ICD-10-CM | POA: Diagnosis present

## 2014-07-05 DIAGNOSIS — R9431 Abnormal electrocardiogram [ECG] [EKG]: Secondary | ICD-10-CM

## 2014-07-05 DIAGNOSIS — E876 Hypokalemia: Secondary | ICD-10-CM | POA: Diagnosis not present

## 2014-07-05 DIAGNOSIS — R1013 Epigastric pain: Secondary | ICD-10-CM | POA: Diagnosis present

## 2014-07-05 DIAGNOSIS — F1010 Alcohol abuse, uncomplicated: Secondary | ICD-10-CM | POA: Diagnosis present

## 2014-07-05 DIAGNOSIS — K219 Gastro-esophageal reflux disease without esophagitis: Secondary | ICD-10-CM | POA: Diagnosis present

## 2014-07-05 DIAGNOSIS — J45909 Unspecified asthma, uncomplicated: Secondary | ICD-10-CM | POA: Diagnosis present

## 2014-07-05 DIAGNOSIS — Z6830 Body mass index (BMI) 30.0-30.9, adult: Secondary | ICD-10-CM

## 2014-07-05 DIAGNOSIS — E669 Obesity, unspecified: Secondary | ICD-10-CM | POA: Diagnosis present

## 2014-07-05 LAB — CBC WITH DIFFERENTIAL/PLATELET
BASOS PCT: 0 % (ref 0–1)
Basophils Absolute: 0 10*3/uL (ref 0.0–0.1)
EOS PCT: 1 % (ref 0–5)
Eosinophils Absolute: 0.1 10*3/uL (ref 0.0–0.7)
HEMATOCRIT: 42.4 % (ref 36.0–46.0)
HEMOGLOBIN: 14.1 g/dL (ref 12.0–15.0)
LYMPHS PCT: 17 % (ref 12–46)
Lymphs Abs: 1.6 10*3/uL (ref 0.7–4.0)
MCH: 31.5 pg (ref 26.0–34.0)
MCHC: 33.3 g/dL (ref 30.0–36.0)
MCV: 94.9 fL (ref 78.0–100.0)
Monocytes Absolute: 0.9 10*3/uL (ref 0.1–1.0)
Monocytes Relative: 10 % (ref 3–12)
NEUTROS ABS: 6.6 10*3/uL (ref 1.7–7.7)
Neutrophils Relative %: 72 % (ref 43–77)
Platelets: 244 10*3/uL (ref 150–400)
RBC: 4.47 MIL/uL (ref 3.87–5.11)
RDW: 15.5 % (ref 11.5–15.5)
WBC: 9.2 10*3/uL (ref 4.0–10.5)

## 2014-07-05 LAB — BASIC METABOLIC PANEL
ANION GAP: 12 (ref 5–15)
BUN: 6 mg/dL (ref 6–23)
CALCIUM: 8.8 mg/dL (ref 8.4–10.5)
CHLORIDE: 93 mmol/L — AB (ref 96–112)
CO2: 31 mmol/L (ref 19–32)
Creatinine, Ser: 0.78 mg/dL (ref 0.50–1.10)
GFR calc Af Amer: >90 mL/min (ref >90)
GFR calc non Af Amer: >90 mL/min (ref >90)
GLUCOSE: 122 mg/dL — AB (ref 70–99)
Potassium: 2.5 mmol/L — CL (ref 3.5–5.1)
SODIUM: 136 mmol/L (ref 135–145)

## 2014-07-05 LAB — MAGNESIUM: MAGNESIUM: 1.8 mg/dL (ref 1.5–2.5)

## 2014-07-05 MED ORDER — SODIUM CHLORIDE 0.9 % IV SOLN
Freq: Once | INTRAVENOUS | Status: AC
  Administered 2014-07-05: 20:00:00 via INTRAVENOUS

## 2014-07-05 MED ORDER — POTASSIUM CHLORIDE 20 MEQ/15ML (10%) PO SOLN
40.0000 meq | Freq: Once | ORAL | Status: AC
  Administered 2014-07-05: 40 meq via ORAL
  Filled 2014-07-05: qty 30

## 2014-07-05 MED ORDER — POTASSIUM CHLORIDE 10 MEQ/100ML IV SOLN
10.0000 meq | INTRAVENOUS | Status: AC
  Administered 2014-07-05 – 2014-07-06 (×4): 10 meq via INTRAVENOUS
  Filled 2014-07-05 (×4): qty 100

## 2014-07-05 NOTE — ED Notes (Signed)
Pt started having abdominal distention x 1 week. Went to PMD on Wednesday, took Maalox and had blood work. Went back today with more bloodwork. Potassium was 2.5 . Advised to come to ED.

## 2014-07-05 NOTE — ED Notes (Signed)
Patient c/o burning at IV site-rate decreased to 1380mL/hr with NS running at 25300mL/hr.  Patient reports no pain at IV site currently.

## 2014-07-05 NOTE — ED Provider Notes (Signed)
CSN: [id number]     Arrival date & time 07/05/14  1850 History   First MD Initiated Contact with Patient 07/05/14 1920     Chief Complaint  Patient presents with  . Abnormal Lab     (Consider location/radiation/quality/duration/timing/severity/associated sxs/prior Treatment) HPI 29 year old female who presents today with reports that she was seen at her primary care physician's office and had labs drawn and was called and told that her potassium was critically low and come to the ED. She has had epigastric discomfort for about a week. She has had poor by mouth intake because of nausea and bloating from this. She was seen in her primary care physician and had a workup done included an ultrasound that did not show any stones.  She was told that she had normal CBC. She has a known history of prolonged QT and has been followed by Dr. Eden EmmsNishan.  Past Medical History  Diagnosis Date  . Asthma   . GERD (gastroesophageal reflux disease)   . Alcohol abuse   . Environmental allergies    Past Surgical History  Procedure Laterality Date  . Tympanostomy tube placement     Family History  Problem Relation Age of Onset  . Heart failure Mother   . Heart failure Father   . Heart failure Maternal Grandfather    History  Substance Use Topics  . Smoking status: Former Smoker -- 0.50 packs/day for 2 years    Types: Cigarettes    Quit date: 04/08/2012  . Smokeless tobacco: Never Used  . Alcohol Use: 0.0 oz/week    0 Standard drinks or equivalent per week     Comment: ETOH abuse, 8 shots/ day   OB History    No data available     Review of Systems  All other systems reviewed and are negative.     Allergies  Review of patient's allergies indicates no known allergies.  Home Medications   Prior to Admission medications   Medication Sig Start Date End Date Taking? Authorizing Provider  albuterol (PROVENTIL HFA;VENTOLIN HFA) 108 (90 BASE) MCG/ACT inhaler Inhale 2 puffs into the lungs every  6 (six) hours as needed for wheezing or shortness of breath.    Historical Provider, MD  ALPRAZolam Prudy Feeler(XANAX) 0.25 MG tablet Take 1 tablet (0.25 mg total) by mouth 2 (two) times daily as needed for anxiety. 06/11/14   Wendall StadePeter C Nishan, MD  beclomethasone (QVAR) 80 MCG/ACT inhaler Inhale 2 puffs into the lungs 2 (two) times daily.    Historical Provider, MD  Beclomethasone Dipropionate 80 MCG/ACT AERS Place into the nose as needed.    Historical Provider, MD  chlorhexidine (PERIDEX) 0.12 % solution Use as directed 10 mLs in the mouth or throat daily.    Historical Provider, MD  metoprolol succinate (TOPROL-XL) 25 MG 24 hr tablet Take 1 tablet (25 mg total) by mouth daily. 06/17/14   Wendall StadePeter C Nishan, MD  mometasone-formoterol [facility](DULERA) 200-5 MCG/ACT AERO Inhale 2 puffs into the lungs 2 (two) times daily.    Historical Provider, MD  montelukast (SINGULAIR) 10 MG tablet Take 10 mg by mouth at bedtime.    Historical Provider, MD  omeprazole (PRILOSEC) 40 MG capsule Take 40 mg by mouth daily.    Historical Provider, MD  ranitidine (ZANTAC) 300 MG tablet Take 300 mg by mouth at bedtime.    Historical Provider, MD   BP 129/86 mmHg  Pulse 109  Temp(Src) 98.4 F (36.9 C) (Oral)  Resp 16  Ht 5\' 4"  (1.626  m)  Wt 209 lb (94.802 kg)  BMI 35.86 kg/m2  SpO2 100%  LMP 06/19/2014 Physical Exam  Constitutional: She is oriented to person, place, and time. She appears well-developed and well-nourished.  HENT:  Head: Normocephalic and atraumatic.  Right Ear: External ear normal.  Left Ear: External ear normal.  Nose: Nose normal.  Mouth/Throat: Oropharynx is clear and moist.  Eyes: Conjunctivae and EOM are normal. Pupils are equal, round, and reactive to light.  Neck: Normal range of motion. Neck supple.  Cardiovascular: Normal rate, regular rhythm, normal heart sounds and intact distal pulses.   Pulmonary/Chest: Effort normal and breath sounds normal.  Abdominal: Soft. Bowel sounds are normal. There is  tenderness.  Mild epigastric tenderness.  Musculoskeletal: Normal range of motion.  Neurological: She is alert and oriented to person, place, and time. She has normal reflexes.  Skin: Skin is warm and dry.  Psychiatric: She has a normal mood and affect. Her behavior is normal. Judgment and thought content normal.  Nursing note and vitals reviewed.   ED Course  Procedures (including critical care time) Labs Review Labs Reviewed  BASIC METABOLIC PANEL - Abnormal; Notable for the following:    Potassium 2.5 (*)    Chloride 93 (*)    Glucose, Bld 122 (*)    All other components within normal limits  CBC WITH DIFFERENTIAL/PLATELET    Imaging Review No results found.   EKG Interpretation   Date/Time:  Friday July 05 2014 19:06:47 EDT Ventricular Rate:  107 PR Interval:  132 QRS Duration: 86 QT Interval:  490 QTC Calculation: 654 R Axis:   40 Text Interpretation:  Critical Test Result: Long QTc Sinus tachycardia  ST \\T \ T wave abnormality, consider inferior ischemia Prolonged QT  Abnormal ECG Confirmed by Carisa Backhaus MD, Duwayne Heck ([id number]) on 07/05/2014 7:29:56 PM      MDM   Final diagnoses:  Hypokalemia  Epigastric pain  Prolonged Q-T interval on ECG    This 29 year old female with severe hypokalemia and prolonged QT. She is given by mouth potassium is placed on potassium drip. Plan telemetry observation tonight with potassium repletion.    Margarita Grizzle, MD 07/05/14 2123

## 2014-07-06 ENCOUNTER — Inpatient Hospital Stay (HOSPITAL_COMMUNITY): Payer: BC Managed Care – PPO

## 2014-07-06 DIAGNOSIS — F1010 Alcohol abuse, uncomplicated: Secondary | ICD-10-CM

## 2014-07-06 DIAGNOSIS — K219 Gastro-esophageal reflux disease without esophagitis: Secondary | ICD-10-CM | POA: Diagnosis present

## 2014-07-06 DIAGNOSIS — I5032 Chronic diastolic (congestive) heart failure: Secondary | ICD-10-CM | POA: Diagnosis present

## 2014-07-06 DIAGNOSIS — R1013 Epigastric pain: Secondary | ICD-10-CM | POA: Diagnosis not present

## 2014-07-06 DIAGNOSIS — I4581 Long QT syndrome: Secondary | ICD-10-CM

## 2014-07-06 DIAGNOSIS — R112 Nausea with vomiting, unspecified: Secondary | ICD-10-CM | POA: Diagnosis present

## 2014-07-06 DIAGNOSIS — E876 Hypokalemia: Principal | ICD-10-CM

## 2014-07-06 DIAGNOSIS — E669 Obesity, unspecified: Secondary | ICD-10-CM | POA: Diagnosis present

## 2014-07-06 DIAGNOSIS — Z87891 Personal history of nicotine dependence: Secondary | ICD-10-CM | POA: Diagnosis not present

## 2014-07-06 DIAGNOSIS — Z6830 Body mass index (BMI) 30.0-30.9, adult: Secondary | ICD-10-CM | POA: Diagnosis not present

## 2014-07-06 DIAGNOSIS — J45909 Unspecified asthma, uncomplicated: Secondary | ICD-10-CM | POA: Diagnosis present

## 2014-07-06 LAB — COMPREHENSIVE METABOLIC PANEL
ALBUMIN: 2.9 g/dL — AB (ref 3.5–5.2)
ALT: 23 U/L (ref 0–35)
AST: 65 U/L — AB (ref 0–37)
Alkaline Phosphatase: 100 U/L (ref 39–117)
Anion gap: 11 (ref 5–15)
BUN: <5 mg/dL — ABNORMAL LOW (ref 6–23)
CALCIUM: 8.4 mg/dL (ref 8.4–10.5)
CO2: 25 mmol/L (ref 19–32)
Chloride: 99 mmol/L (ref 96–112)
Creatinine, Ser: 0.69 mg/dL (ref 0.50–1.10)
GFR calc non Af Amer: >90 mL/min (ref >90)
Glucose, Bld: 100 mg/dL — ABNORMAL HIGH (ref 70–99)
POTASSIUM: 3.5 mmol/L (ref 3.5–5.1)
SODIUM: 135 mmol/L (ref 135–145)
Total Bilirubin: 1.7 mg/dL — ABNORMAL HIGH (ref 0.3–1.2)
Total Protein: 6.2 g/dL (ref 6.0–8.3)

## 2014-07-06 LAB — CBC
HCT: 42.3 % (ref 36.0–46.0)
Hemoglobin: 14 g/dL (ref 12.0–15.0)
MCH: 31.9 pg (ref 26.0–34.0)
MCHC: 33.1 g/dL (ref 30.0–36.0)
MCV: 96.4 fL (ref 78.0–100.0)
Platelets: 217 10*3/uL (ref 150–400)
RBC: 4.39 MIL/uL (ref 3.87–5.11)
RDW: 15.9 % — ABNORMAL HIGH (ref 11.5–15.5)
WBC: 7.6 10*3/uL (ref 4.0–10.5)

## 2014-07-06 LAB — BRAIN NATRIURETIC PEPTIDE: B Natriuretic Peptide: 79.2 pg/mL (ref 0.0–100.0)

## 2014-07-06 LAB — MAGNESIUM: MAGNESIUM: 1.8 mg/dL (ref 1.5–2.5)

## 2014-07-06 LAB — LIPASE, BLOOD: LIPASE: 30 U/L (ref 11–59)

## 2014-07-06 LAB — LACTIC ACID, PLASMA: Lactic Acid, Venous: 1.5 mmol/L (ref 0.5–2.0)

## 2014-07-06 MED ORDER — MONTELUKAST SODIUM 10 MG PO TABS
10.0000 mg | ORAL_TABLET | Freq: Every day | ORAL | Status: DC
  Filled 2014-07-06: qty 1

## 2014-07-06 MED ORDER — SODIUM CHLORIDE 0.9 % IV SOLN
Freq: Once | INTRAVENOUS | Status: AC
  Administered 2014-07-06: 01:00:00 via INTRAVENOUS

## 2014-07-06 MED ORDER — ALBUTEROL SULFATE (2.5 MG/3ML) 0.083% IN NEBU: 3.0000 mL | INHALATION_SOLUTION | Freq: Four times a day (QID) | RESPIRATORY_TRACT | Status: DC | PRN

## 2014-07-06 MED ORDER — PANTOPRAZOLE SODIUM 40 MG PO TBEC
40.0000 mg | DELAYED_RELEASE_TABLET | Freq: Every day | ORAL | Status: DC
  Administered 2014-07-06: 40 mg via ORAL
  Filled 2014-07-06: qty 1

## 2014-07-06 MED ORDER — MOMETASONE FURO-FORMOTEROL FUM 200-5 MCG/ACT IN AERO
2.0000 | INHALATION_SPRAY | Freq: Two times a day (BID) | RESPIRATORY_TRACT | Status: DC
  Filled 2014-07-06: qty 8.8

## 2014-07-06 MED ORDER — ALPRAZOLAM 0.25 MG PO TABS: 0.2500 mg | ORAL_TABLET | Freq: Two times a day (BID) | ORAL | Status: DC | PRN

## 2014-07-06 MED ORDER — ENOXAPARIN SODIUM 40 MG/0.4ML ~~LOC~~ SOLN
40.0000 mg | SUBCUTANEOUS | Status: DC
  Administered 2014-07-06: 40 mg via SUBCUTANEOUS
  Filled 2014-07-06: qty 0.4

## 2014-07-06 MED ORDER — METOPROLOL SUCCINATE ER 25 MG PO TB24
25.0000 mg | ORAL_TABLET | Freq: Every day | ORAL | Status: DC
  Administered 2014-07-06: 25 mg via ORAL
  Filled 2014-07-06: qty 1

## 2014-07-06 MED ORDER — ACETAMINOPHEN 650 MG RE SUPP: 650.0000 mg | Freq: Four times a day (QID) | RECTAL | Status: DC | PRN

## 2014-07-06 MED ORDER — FLUTICASONE PROPIONATE HFA 44 MCG/ACT IN AERO: 2.0000 | INHALATION_SPRAY | Freq: Two times a day (BID) | RESPIRATORY_TRACT | Status: DC

## 2014-07-06 MED ORDER — ACETAMINOPHEN 325 MG PO TABS
650.0000 mg | ORAL_TABLET | Freq: Four times a day (QID) | ORAL | Status: DC | PRN
Start: 2014-07-06 — End: 2014-07-06

## 2014-07-06 MED ORDER — FAMOTIDINE 20 MG PO TABS
20.0000 mg | ORAL_TABLET | Freq: Every day | ORAL | Status: DC
  Administered 2014-07-06: 20 mg via ORAL
  Filled 2014-07-06: qty 1

## 2014-07-06 MED ORDER — PANTOPRAZOLE SODIUM 40 MG IV SOLR
40.0000 mg | Freq: Once | INTRAVENOUS | Status: AC
  Administered 2014-07-06: 40 mg via INTRAVENOUS
  Filled 2014-07-06: qty 40

## 2014-07-06 MED ORDER — SODIUM CHLORIDE 0.9 % IJ SOLN
3.0000 mL | Freq: Two times a day (BID) | INTRAMUSCULAR | Status: DC
  Administered 2014-07-06: 3 mL via INTRAVENOUS

## 2014-07-06 NOTE — H&P (Addendum)
[facility] History and Physical  Patient: Kara [name]  MRN: [id number]  DOB: Jan 04, 1986  DOS: the patient was seen and examined on 07/06/2014 PCP: Zelphia Cairo, MD  Chief Complaint: Nausea and vomiting  HPI: Kara [name] is a 29 y.o. female with Past medical history of asthma, GERD, alcohol use. The patient presents with complaints of nausea vomiting and epigastric pain. Patient has been having the symptoms for last couple of months and has been being worked up by her PCP as an outpatient. She had an ultrasound that shows hepatic steatosis but no other acute abnormality. She had mild elevation of her LFT. Also at her baseline she has a prolonged QTC. She is on metoprolol for the same. She presents with complaints of progressively worsening nausea and vomiting to her PCP at which time blood work was obtained which was showing low potassium level and therefore she was sent to ER for further workup. Patient at the time of my evaluation mentions her nausea is better she does not have any significant abdominal pain. She does not have any constipation or diarrhea. She has been taking PPI and has increased it in the last 1 week to twice a day. She is on using her albuterol inhaler appropriately. She is to drink 6-7 shots of vodka on a daily basis and has not been drinking any alcohol since last one week. She mentions she has gradually wean her off of alcohol.  The patient is coming from home And at her baseline independent for most of her ADL.  Review of Systems: as mentioned in the history of present illness.  A Comprehensive review of the other systems is negative.  Past Medical History  Diagnosis Date  . Asthma   . GERD (gastroesophageal reflux disease)   . Alcohol abuse   . Environmental allergies    Past Surgical History  Procedure Laterality Date  . Tympanostomy tube placement     Social History:  reports that she quit smoking about 2 years ago. Her smoking use  included Cigarettes. She has a 1 pack-year smoking history. She has never used smokeless tobacco. She reports that she drinks alcohol. She reports that she does not use illicit drugs.  No Known Allergies  Family History  Problem Relation Age of Onset  . Heart failure Mother   . Heart failure Father   . Heart failure Maternal Grandfather     Prior to Admission medications   Medication Sig Start Date End Date Taking? Authorizing Provider  albuterol (PROVENTIL HFA;VENTOLIN HFA) 108 (90 BASE) MCG/ACT inhaler Inhale 2 puffs into the lungs every 6 (six) hours as needed for wheezing or shortness of breath.    Historical Provider, MD  ALPRAZolam Prudy Feeler) 0.25 MG tablet Take 1 tablet (0.25 mg total) by mouth 2 (two) times daily as needed for anxiety. 06/11/14   Wendall Stade, MD  beclomethasone (QVAR) 80 MCG/ACT inhaler Inhale 2 puffs into the lungs 2 (two) times daily.    Historical Provider, MD  Beclomethasone Dipropionate 80 MCG/ACT AERS Place into the nose as needed.    Historical Provider, MD  chlorhexidine (PERIDEX) 0.12 % solution Use as directed 10 mLs in the mouth or throat daily.    Historical Provider, MD  metoprolol succinate (TOPROL-XL) 25 MG 24 hr tablet Take 1 tablet (25 mg total) by mouth daily. 06/17/14   Wendall Stade, MD  mometasone-formoterol [facility]) 200-5 MCG/ACT AERO Inhale 2 puffs into the lungs 2 (two) times daily.    Historical Provider, MD  montelukast (SINGULAIR) 10 MG tablet Take 10 mg by mouth at bedtime.    Historical Provider, MD  omeprazole (PRILOSEC) 40 MG capsule Take 40 mg by mouth daily.    Historical Provider, MD  ranitidine (ZANTAC) 300 MG tablet Take 300 mg by mouth at bedtime.    Historical Provider, MD    Physical Exam: Filed Vitals:   07/06/14 0030 07/06/14 0100 07/06/14 0130 07/06/14 0243  BP: 126/86 120/77 118/82 122/96  Pulse: 94 86 90 88  Temp:    98.7 F (37.1 C)  TempSrc:    Oral  Resp: Height:     (1.626 m)  Weight:    95.2  kg (209 lb 14.1 oz)  SpO2: 100% 100% 100% 100%    General: Alert, Awake and Oriented to Time, Place and Person. Appear in mild distress Eyes: PERRL ENT: Oral Mucosa clear moist Neck: no JVD Cardiovascular: S1 and S2 Present, no Murmur, Peripheral Pulses Present Respiratory: Bilateral Air entry equal and Decreased, Clear to Auscultation, noCrackles, no wheezes Abdomen: Bowel Sound present, Soft and non tender Skin: no Rash Extremities: no Pedal edema, no calf tenderness Neurologic: Grossly no focal neuro deficit.  Labs on Admission:  CBC:  Recent Labs Lab 07/05/14 1951 07/06/14 0347  WBC 9.2 7.6  NEUTROABS 6.6  --   HGB 14.1 14.0  HCT 42.4 42.3  MCV 94.9 96.4  PLT 244 217    CMP     Component Value Date/Time   NA 135 07/06/2014 0347   K 3.5 07/06/2014 0347   CL 99 07/06/2014 0347   CO2 25 07/06/2014 0347   GLUCOSE 100* 07/06/2014 0347   BUN <5* 07/06/2014 0347   CREATININE 0.69 07/06/2014 0347   CALCIUM 8.4 07/06/2014 0347   PROT 6.2 07/06/2014 0347   ALBUMIN 2.9* 07/06/2014 0347   AST 65* 07/06/2014 0347   ALT 23 07/06/2014 0347   ALKPHOS 100 07/06/2014 0347   BILITOT 1.7* 07/06/2014 0347   GFRNONAA >90 07/06/2014 0347   GFRAA >90 07/06/2014 0347    No results for input(s): LIPASE, AMYLASE in the last 168 hours.  No results for input(s): CKTOTAL, CKMB, CKMBINDEX, TROPONINI in the last 168 hours. BNP (last 3 results) No results for input(s): BNP in the last 8760 hours.  ProBNP (last 3 results) No results for input(s): PROBNP in the last 8760 hours.   Radiological Exams on Admission: No results found.  EKG: Independently reviewed. normal sinus rhythm, nonspecific ST and T waves changes, prolonged QT interval.  Assessment/Plan Principal Problem:   Hypokalemia Active Problems:   Prolonged Q-T interval on ECG   Alcohol abuse   Nausea with vomiting   Abdominal pain, epigastric   1. Hypokalemia The patient is presenting with progressive nausea and  occasional vomiting as well as abdominal pain. Workup shows significantly low potassium with normal magnesium. EKG was showing significantly prolonged QTC 600+. Patient did not have any symptoms. Patient  was given potassium replacement and is currently admitted in the hospital. Will monitor on telemetry. Recheck potassium level. Replace as needed. If no improvement will require further workup.  2.Prolonged QT interval. Continue metoprolol. Patient denies any use of any other medications. Recheck magnesium.  3.Nausea vomiting and epigastric pain. The patient is being followed up by her PCP for the same complaint as an outpatient. The patient mentions her PCP has recommended possible GI consultation and workup next week. Continue with the outpatient workup unless patient becomes acutely symptomatic. Recheck lipase  and LFT.  4. alcohol abuse. Patient does not appear to have any significant tremors. Continue gentle hydration and monitor her for withdrawal. .   DVT Prophylaxis: subcutaneous Heparin. Nutrition: Regular diet   Disposition: Admitted to inpatient in telemetry unit.  Author: Lynden OxfordPranav Patel, MD [facility] Pager: [phone] 07/06/2014, 6:01 AM    If 7PM-7AM, please contact night-coverage [email or web address] Password [email or web address]

## 2014-07-06 NOTE — Progress Notes (Addendum)
Pt arrived to unit via [facility].  VSS, pt reports no pain.  CCMD notified, call bell within reach.  Dr Allena KatzPatel paged and notified of pt's arrival to unit. Will continue to monitor.  Erenest Rasheraldwell,Mikolaj Woolstenhulme B, RN

## 2014-07-06 NOTE — Discharge Summary (Signed)
Discharge Summary  Kara [name] ZOX:[id number] DOB: Oct 27, 1985  PCP:  Duane Lope, MD  Admit date: 07/05/2014 Discharge date: 07/06/2014  Time spent: 25 minutes  Recommendations for Outpatient Follow-up:  1. Patient is to follow-up with her primary care physician and then get referral for gastroenterology for abdominal pain workup   Discharge Diagnoses:  Active Hospital Problems   Diagnosis Date Noted  . Hypokalemia 04/08/2014  . Nausea with vomiting 07/06/2014  . Abdominal pain, epigastric 07/06/2014  . Obesity (BMI 30-39.9) 07/06/2014  . Chronic diastolic heart failure 07/06/2014  . Alcohol abuse 04/08/2014  . Prolonged Q-T interval on ECG 04/08/2014    Resolved Hospital Problems   Diagnosis Date Noted Date Resolved  No resolved problems to display.    Discharge Condition: Improved, being discharged home  Diet recommendation: Heart healthy  Filed Weights   07/05/14 1859 07/06/14 0243  Weight: 94.802 kg (209 lb) 95.2 kg (209 lb 14.1 oz)    History of present illness:  Patient is a 29 year old female past oral history of alcohol abuse, prolonged QT and chronic diastolic heart failure who is been having issues of nausea and vomiting and epigastric pain for the last few months. She has continued to drink as well. She saw her PCP several days ago and blood work was obtained which noted significant hypokalemia and patient was sent over to the emergency room. Potassium there is noted to be 2.5. She was unable to keep anything down, it was felt best that she come in for observation.  Hospital Course:  Principal Problem:   Hypokalemia: Potassium replaced. Potassium on day of discharge is 3.5, patient given some mild additional supplementation Active Problems:   Prolonged Q-T interval on ECG: EKG stable on admission   Alcohol abuse patient states that she's not been drinking 1 week. Encouraged her to continue to do so.   Nausea with vomiting w/Abdominal pain, epigastric: Suspect  gastritis versus gastric ulcer, likely secondary to alcohol. Lipase level checked on admission found to be normal. Abdominal x-ray checked noting no evidence of ileus or retained stool    Obesity (BMI 30-39.9): Patient meets criteria with BMI greater than 30   Chronic diastolic heart failure: Patient with previous history. BNP checked and found to be normal   Procedures:  None  Consultations:  None  Discharge Exam: BP 121/84 mmHg  Pulse 85  Temp(Src) 98.2 F (36.8 C) (Oral)  Resp 19  Ht  (1.626 m)  Wt 95.2 kg (209 lb 14.1 oz)  BMI 36.01 kg/m2  SpO2 100%  LMP 06/19/2014  General: Alert and oriented 3, no acute distress Cardiovascular: Regular rate and rhythm, S1-S2 Respiratory: Clear to auscultation bilaterally  Discharge Instructions You were cared for by a hospitalist during your hospital stay. If you have any questions about your discharge medications or the care you received while you were in the hospital after you are discharged, you can call the unit and asked to speak with the hospitalist on call if the hospitalist that took care of you is not available. Once you are discharged, your primary care physician will handle any further medical issues. Please note that NO REFILLS for any discharge medications will be authorized once you are discharged, as it is imperative that you return to your primary care physician (or establish a relationship with a primary care physician if you do not have one) for your aftercare needs so that they can reassess your need for medications and monitor your lab values.  Medication List    STOP taking these medications        QVAR 80 MCG/ACT inhaler  Generic drug:  beclomethasone      TAKE these medications        albuterol 108 (90 BASE) MCG/ACT inhaler  Commonly known as:  PROVENTIL HFA;VENTOLIN HFA  Inhale 2 puffs into the lungs every 6 (six) hours as needed for wheezing or shortness of breath.     ALPRAZolam 0.25 MG tablet    Commonly known as:  XANAX  Take 1 tablet (0.25 mg total) by mouth 2 (two) times daily as needed for anxiety.     Beclomethasone Dipropionate 80 MCG/ACT Aers  Place into the nose as needed.     chlorhexidine 0.12 % solution  Commonly known as:  PERIDEX  Use as directed 10 mLs in the mouth or throat daily.     DULERA 200-5 MCG/ACT Aero  Generic drug:  mometasone-formoterol  Inhale 2 puffs into the lungs 2 (two) times daily.     metoprolol succinate 25 MG 24 hr tablet  Commonly known as:  TOPROL-XL  Take 1 tablet (25 mg total) by mouth daily.     montelukast 10 MG tablet  Commonly known as:  SINGULAIR  Take 10 mg by mouth at bedtime.     omeprazole 40 MG capsule  Commonly known as:  PRILOSEC  Take 40 mg by mouth daily.     ranitidine 300 MG tablet  Commonly known as:  ZANTAC  Take 300 mg by mouth at bedtime.       No Known Allergies     Follow-up Information    Follow up with  Duane Lope, MD In 1 month.   Specialty:  Family Medicine   Contact information:   [address] [place] [address] [phone]        The results of significant diagnostics from this hospitalization (including imaging, microbiology, ancillary and laboratory) are listed below for reference.    Significant Diagnostic Studies: Dg Abd Portable 1v  07/06/2014   CLINICAL DATA:  Epigastric pain for 2 days  EXAM: PORTABLE ABDOMEN - 1 VIEW  COMPARISON:  None.  FINDINGS: No dilated loops of large or small bowel. No organomegaly. No pathologic calcifications.  IMPRESSION: No acute abdominal findings by radiograph   Electronically Signed   By: Genevive Bi M.D.   On: 07/06/2014 13:54    Microbiology: No results found for this or any previous visit (from the past 240 hour(s)).   Labs: Basic Metabolic Panel:  Recent Labs Lab 07/05/14 1951 07/06/14 0347  NA 136 135  K 2.5* 3.5  CL 93* 99  CO2 31 25  GLUCOSE 122* 100*  BUN 6 <5*  CREATININE 0.78 0.69  CALCIUM 8.8 8.4  MG 1.8  1.8   Liver Function Tests:  Recent Labs Lab 07/06/14 0347  AST 65*  ALT 23  ALKPHOS 100  BILITOT 1.7*  PROT 6.2  ALBUMIN 2.9*    Recent Labs Lab 07/06/14 0347  LIPASE 30   No results for input(s): AMMONIA in the last 168 hours. CBC:  Recent Labs Lab 07/05/14 1951 07/06/14 0347  WBC 9.2 7.6  NEUTROABS 6.6  --   HGB 14.1 14.0  HCT 42.4 42.3  MCV 94.9 96.4  PLT 244 217   Cardiac Enzymes: No results for input(s): CKTOTAL, CKMB, CKMBINDEX, TROPONINI in the last 168 hours. BNP: BNP (last 3 results)  Recent Labs  07/06/14 1255  BNP 79.2  ProBNP (last 3 results) No results for input(s): PROBNP in the last 8760 hours.  CBG: No results for input(s): GLUCAP in the last 168 hours.     Signed:  Hollice EspyKRISHNAN,Hasel Janish K  [facility] 07/06/2014, 3:55 PM

## 2014-07-06 NOTE — ED Notes (Signed)
Patient reports she feels like she is having reflux.  Provider made aware.

## 2014-07-08 MED FILL — Morphine Sulfate Inj 10 MG/ML: INTRAMUSCULAR | Qty: 1 | Status: AC

## 2014-07-09 ENCOUNTER — Other Ambulatory Visit (HOSPITAL_COMMUNITY): Payer: Self-pay | Admitting: Gastroenterology

## 2014-07-09 ENCOUNTER — Institutional Professional Consult (permissible substitution): Payer: BC Managed Care – PPO | Admitting: Internal Medicine

## 2014-07-09 DIAGNOSIS — R1013 Epigastric pain: Secondary | ICD-10-CM

## 2014-07-12 ENCOUNTER — Encounter (HOSPITAL_COMMUNITY): Payer: Self-pay | Admitting: Emergency Medicine

## 2014-07-12 ENCOUNTER — Emergency Department (HOSPITAL_COMMUNITY)
Admission: EM | Admit: 2014-07-12 | Discharge: 2014-07-13 | Disposition: A | Discharge status: Home / Self Care | Payer: BC Managed Care – PPO | Attending: Emergency Medicine | Admitting: Emergency Medicine

## 2014-07-12 DIAGNOSIS — K219 Gastro-esophageal reflux disease without esophagitis: Secondary | ICD-10-CM | POA: Diagnosis not present

## 2014-07-12 DIAGNOSIS — R1013 Epigastric pain: Secondary | ICD-10-CM | POA: Diagnosis present

## 2014-07-12 DIAGNOSIS — Z3202 Encounter for pregnancy test, result negative: Secondary | ICD-10-CM | POA: Diagnosis not present

## 2014-07-12 DIAGNOSIS — R112 Nausea with vomiting, unspecified: Secondary | ICD-10-CM | POA: Diagnosis not present

## 2014-07-12 DIAGNOSIS — Z79899 Other long term (current) drug therapy: Secondary | ICD-10-CM | POA: Insufficient documentation

## 2014-07-12 DIAGNOSIS — Z87891 Personal history of nicotine dependence: Secondary | ICD-10-CM | POA: Insufficient documentation

## 2014-07-12 DIAGNOSIS — J45909 Unspecified asthma, uncomplicated: Secondary | ICD-10-CM | POA: Diagnosis not present

## 2014-07-12 LAB — URINALYSIS, ROUTINE W REFLEX MICROSCOPIC
Bilirubin Urine: NEGATIVE
Glucose, UA: NEGATIVE mg/dL
HGB URINE DIPSTICK: NEGATIVE
Ketones, ur: NEGATIVE mg/dL
NITRITE: NEGATIVE
Protein, ur: 30 mg/dL — AB
SPECIFIC GRAVITY, URINE: 1.014 (ref 1.005–1.030)
UROBILINOGEN UA: 0.2 mg/dL (ref 0.0–1.0)
pH: 6 (ref 5.0–8.0)

## 2014-07-12 LAB — CBC WITH DIFFERENTIAL/PLATELET
BASOS PCT: 1 % (ref 0–1)
Basophils Absolute: 0.1 10*3/uL (ref 0.0–0.1)
EOS ABS: 0.2 10*3/uL (ref 0.0–0.7)
Eosinophils Relative: 2 % (ref 0–5)
HEMATOCRIT: 41.9 % (ref 36.0–46.0)
HEMOGLOBIN: 13.9 g/dL (ref 12.0–15.0)
Lymphocytes Relative: 29 % (ref 12–46)
Lymphs Abs: 2.2 10*3/uL (ref 0.7–4.0)
MCH: 31.2 pg (ref 26.0–34.0)
MCHC: 33.2 g/dL (ref 30.0–36.0)
MCV: 94.2 fL (ref 78.0–100.0)
MONO ABS: 0.6 10*3/uL (ref 0.1–1.0)
MONOS PCT: 8 % (ref 3–12)
NEUTROS ABS: 4.4 10*3/uL (ref 1.7–7.7)
Neutrophils Relative %: 60 % (ref 43–77)
Platelets: 370 10*3/uL (ref 150–400)
RBC: 4.45 MIL/uL (ref 3.87–5.11)
RDW: 15.5 % (ref 11.5–15.5)
WBC: 7.4 10*3/uL (ref 4.0–10.5)

## 2014-07-12 LAB — COMPREHENSIVE METABOLIC PANEL
ALK PHOS: 118 U/L — AB (ref 39–117)
ALT: 33 U/L (ref 0–35)
AST: 140 U/L — ABNORMAL HIGH (ref 0–37)
Albumin: 3.1 g/dL — ABNORMAL LOW (ref 3.5–5.2)
Anion gap: 10 (ref 5–15)
BUN: <5 mg/dL — ABNORMAL LOW (ref 6–23)
CO2: 28 mmol/L (ref 19–32)
Calcium: 8.8 mg/dL (ref 8.4–10.5)
Chloride: 98 mmol/L (ref 96–112)
Creatinine, Ser: 0.7 mg/dL (ref 0.50–1.10)
GLUCOSE: 96 mg/dL (ref 70–99)
POTASSIUM: 3.6 mmol/L (ref 3.5–5.1)
SODIUM: 136 mmol/L (ref 135–145)
Total Bilirubin: 0.6 mg/dL (ref 0.3–1.2)
Total Protein: 6.9 g/dL (ref 6.0–8.3)

## 2014-07-12 LAB — URINE MICROSCOPIC-ADD ON

## 2014-07-12 LAB — POC URINE PREG, ED: PREG TEST UR: NEGATIVE

## 2014-07-12 LAB — LIPASE, BLOOD: Lipase: 26 U/L (ref 11–59)

## 2014-07-12 MED ORDER — GI COCKTAIL ~~LOC~~
30.0000 mL | Freq: Once | ORAL | Status: AC
  Administered 2014-07-12: 30 mL via ORAL
  Filled 2014-07-12: qty 30

## 2014-07-12 MED ORDER — HYDROMORPHONE HCL 1 MG/ML IJ SOLN: 0.5000 mg | INTRAMUSCULAR | Status: DC | PRN

## 2014-07-12 MED ORDER — IOHEXOL 300 MG/ML  SOLN
25.0000 mL | Freq: Once | INTRAMUSCULAR | Status: AC | PRN
  Administered 2014-07-12: 25 mL via ORAL

## 2014-07-12 MED ORDER — HYDROMORPHONE HCL 1 MG/ML IJ SOLN
0.5000 mg | Freq: Once | INTRAMUSCULAR | Status: AC
  Administered 2014-07-12: 0.5 mg via INTRAVENOUS
  Filled 2014-07-12: qty 1

## 2014-07-12 MED ORDER — ONDANSETRON HCL 4 MG/2ML IJ SOLN
4.0000 mg | Freq: Once | INTRAMUSCULAR | Status: AC
  Administered 2014-07-12: 4 mg via INTRAVENOUS
  Filled 2014-07-12: qty 2

## 2014-07-12 NOTE — ED Notes (Signed)
Pt reports continued abdominal pain and back pain. Supposed to have follow up procedure to look for sludge in gallbladder April 7 but pt sts pain is too bad. Last bm 4 days ago. C/o numbness to legs now too. Denies nv.

## 2014-07-12 NOTE — ED Provider Notes (Signed)
CSN: [id number]     Arrival date & time 07/12/14  [id number] History   First MD Initiated Contact with Patient 07/12/14 2122     Chief Complaint  Patient presents with  . Abdominal Pain     (Consider location/radiation/quality/duration/timing/severity/associated sxs/prior Treatment) HPI Kara [name] is a 29 y.o. female with hx of gerd, alcohol abuse, asthma, presents to ED with complaint of abdominal pain. Patient states that her symptoms began about a month ago. She has seen her primary care doctor several times since then. States she had ultrasound which did not show anything abnormal. She was scheduled for a hiatus scan. States pain is epigastric, radiates around the entire abdomen to the back. Patient states she is having associated nausea and vomiting. She has no appetite. Denies fever, chills. No urinary symptoms. No diarrhea.  Pt states today pain worsened. It has been constant. She states she has been taking tramadol with no improvement of her symptoms. Also started on  Zantac, prilosec which she is taking. Pt denies blood in her stool or emesis. Denies black stools. Hx of daily alcohol use but states in the last month has cut down drastically. No other complaints.   Past Medical History  Diagnosis Date  . Asthma   . GERD (gastroesophageal reflux disease)   . Alcohol abuse   . Environmental allergies    Past Surgical History  Procedure Laterality Date  . Tympanostomy tube placement     Family History  Problem Relation Age of Onset  . Heart failure Mother   . Heart failure Father   . Heart failure Maternal Grandfather    History  Substance Use Topics  . Smoking status: Former Smoker -- 0.50 packs/day for 2 years    Types: Cigarettes    Quit date: 04/08/2012  . Smokeless tobacco: Never Used  . Alcohol Use: 0.0 oz/week    0 Standard drinks or equivalent per week     Comment: ETOH abuse, 8 shots/ day   OB History    No data available     Review of Systems  Constitutional:  Negative for fever and chills.  Respiratory: Negative for cough, chest tightness and shortness of breath.   Cardiovascular: Negative for chest pain, palpitations and leg swelling.  Gastrointestinal: Positive for nausea, vomiting and abdominal pain. Negative for diarrhea and blood in stool.  Genitourinary: Negative for dysuria, flank pain and pelvic pain.  Musculoskeletal: Negative for myalgias, arthralgias, neck pain and neck stiffness.  Skin: Negative for rash.  Neurological: Negative for dizziness, weakness and headaches.  All other systems reviewed and are negative.     Allergies  Peanuts  Home Medications   Prior to Admission medications   Medication Sig Start Date End Date Taking? Authorizing Provider  albuterol (PROVENTIL HFA;VENTOLIN HFA) 108 (90 BASE) MCG/ACT inhaler Inhale 2 puffs into the lungs every 6 (six) hours as needed for wheezing or shortness of breath.   Yes Historical Provider, MD  ALPRAZolam (XANAX) 0.25 MG tablet Take 1 tablet (0.25 mg total) by mouth 2 (two) times daily as needed for anxiety. 06/11/14  Yes Wendall StadePeter C Nishan, MD  chlorhexidine (PERIDEX) 0.12 % solution Use as directed 10 mLs in the mouth or throat daily.   Yes Historical Provider, MD  fexofenadine (ALLEGRA) 180 MG tablet Take 180 mg by mouth daily.   Yes Historical Provider, MD  metoprolol succinate (TOPROL-XL) 25 MG 24 hr tablet Take 1 tablet (25 mg total) by mouth daily. 06/17/14  Yes Wendall StadePeter C Nishan, MD  mometasone-formoterol (DULERA) 200-5 MCG/ACT AERO Inhale 2 puffs into the lungs 2 (two) times daily.   Yes Historical Provider, MD  montelukast (SINGULAIR) 10 MG tablet Take 10 mg by mouth daily.    Yes Historical Provider, MD  omeprazole (PRILOSEC) 40 MG capsule Take 40 mg by mouth 2 (two) times daily.    Yes Historical Provider, MD  ranitidine (ZANTAC) 300 MG tablet Take 300 mg by mouth at bedtime as needed for heartburn.    Yes Historical Provider, MD  sucralfate (CARAFATE) 1 G tablet Take 1 tablet  by mouth 4 (four) times daily. 07/09/14  Yes Historical Provider, MD  traMADol (ULTRAM) 50 MG tablet Take 50 mg by mouth 4 (four) times daily as needed. 07/08/14  Yes Historical Provider, MD   BP 105/74 mmHg  Pulse 101  Temp(Src) 98.4 F (36.9 C) (Oral)  Resp 18  Ht  (1.626 m)  Wt 209 lb (94.802 kg)  BMI 35.86 kg/m2  SpO2 100%  LMP 06/19/2014 Physical Exam  Constitutional: She appears well-developed and well-nourished.  Pt is tearful  HENT:  Head: Normocephalic.  Eyes: Conjunctivae are normal.  Neck: Neck supple.  Cardiovascular: Normal rate, regular rhythm and normal heart sounds.   Pulmonary/Chest: Effort normal and breath sounds normal. No respiratory distress. She has no wheezes. She has no rales.  Abdominal: Soft. Bowel sounds are normal. She exhibits no distension. There is tenderness. There is no rebound.  RUQ, epigastric, LUQ tenderness  Musculoskeletal: She exhibits no edema.  Neurological: She is alert.  Skin: Skin is warm and dry.  Psychiatric: She has a normal mood and affect. Her behavior is normal.  Nursing note and vitals reviewed.   ED Course  Procedures (including critical care time) Labs Review Labs Reviewed  COMPREHENSIVE METABOLIC PANEL - Abnormal; Notable for the following:    BUN <5 (*)    Albumin 3.1 (*)    AST 140 (*)    Alkaline Phosphatase 118 (*)    All other components within normal limits  URINALYSIS, ROUTINE W REFLEX MICROSCOPIC - Abnormal; Notable for the following:    Color, Urine AMBER (*)    APPearance CLOUDY (*)    Protein, ur 30 (*)    Leukocytes, UA SMALL (*)    All other components within normal limits  URINE MICROSCOPIC-ADD ON - Abnormal; Notable for the following:    Squamous Epithelial / LPF FEW (*)    Bacteria, UA FEW (*)    All other components within normal limits  CBC WITH DIFFERENTIAL/PLATELET  LIPASE, BLOOD  POC URINE PREG, ED    Imaging Review Ct Abdomen Pelvis W Contrast  07/13/2014   CLINICAL DATA:   Abdominal pain, swelling.  EXAM: CT ABDOMEN AND PELVIS WITH CONTRAST  TECHNIQUE: Multidetector CT imaging of the abdomen and pelvis was performed using the standard protocol following bolus administration of intravenous contrast.  CONTRAST:  OMNIPAQUE IOHEXOL 300 MG/ML  SOLN  COMPARISON:  Plain films 07/06/2014  FINDINGS: Lower chest: Lung bases are clear. No effusions. Heart is normal size.  Hepatobiliary: Mild diffuse fatty infiltration of the liver. No focal hepatic abnormality. Gallbladder unremarkable.  Pancreas: No focal abnormality or ductal dilatation.  Spleen: No focal abnormality.  Normal size.  Adrenals/Urinary Tract: No focal renal or adrenal abnormality. No hydronephrosis. Urinary bladder is unremarkable.  Stomach/Bowel: Stomach, large and small bowel grossly unremarkable. Appendix is visualized and unremarkable.  Vascular/Lymphatic: No retroperitoneal or mesenteric adenopathy. Aorta normal caliber.  Reproductive: No mass or other significant abnormality.  Other: No free fluid or free air.  Musculoskeletal: No focal bone lesion or acute bony abnormality.  IMPRESSION: Diffuse fatty infiltration of the liver. No acute findings in the abdomen or pelvis.   Electronically Signed   By: Charlett Nose M.D.   On: 07/13/2014 00:59     EKG Interpretation None      MDM   Final diagnoses:  Abdominal pain, epigastric    Pt with epigastric abdominal pain for over a month. Negative outpatient [place]. Pain is epigastric. No peritoneal signs. Pt appears to be in a lot of pain. Will get labs, CT abd pelvis for further evaluation.    1:35 AM pts labs all unremarkable. CT negative other than fatty liver. i suspect pt may have gastritis vs PUD given heavy alcohol and NSAID use. No free air on CT. No signs of infection. Abdomen soft. Gallbladder with no evidence of cholecystitis. Will start on zantac, protonix, continue sucrafate. norco for severe pain. Follow up with GI specialist.   Filed Vitals:    07/13/14 0100 07/13/14 0115 07/13/14 0130 07/13/14 0133  BP: 103/62 109/66 111/64 111/64  Pulse: 102 105 104 107  Temp:      TempSrc:      Resp:    12  Height:      Weight:      SpO2: 100% 100% 100% 100%       Jaynie Crumble, PA-C 07/13/14 0137  Donnetta Hutching, MD 07/13/14 1810

## 2014-07-13 ENCOUNTER — Emergency Department (HOSPITAL_COMMUNITY): Payer: BC Managed Care – PPO

## 2014-07-13 ENCOUNTER — Encounter (HOSPITAL_COMMUNITY): Payer: Self-pay | Admitting: Radiology

## 2014-07-13 MED ORDER — HYDROCODONE-ACETAMINOPHEN 5-325 MG PO TABS: 1.0000 | ORAL_TABLET | Freq: Four times a day (QID) | ORAL | Status: DC | PRN

## 2014-07-13 MED ORDER — PANTOPRAZOLE SODIUM 20 MG PO TBEC: 20.0000 mg | DELAYED_RELEASE_TABLET | Freq: Every day | ORAL | Status: DC

## 2014-07-13 MED ORDER — RANITIDINE HCL 150 MG PO CAPS
150.0000 mg | ORAL_CAPSULE | Freq: Every day | ORAL | Status: DC
Start: 2014-07-13 — End: 2014-07-19

## 2014-07-13 MED ORDER — IOHEXOL 300 MG/ML  SOLN
100.0000 mL | Freq: Once | INTRAMUSCULAR | Status: AC | PRN
  Administered 2014-07-13: 100 mL via INTRAVENOUS

## 2014-07-13 NOTE — Discharge Instructions (Signed)
I think your symptoms are most likely from gastritis or possible peptic ulcer disease. Your CT today showed fatty liver otherwise normal. Your blood work show no signs of infection. Make sure to take protonix an zantac daily in addition to sucrafate. Take norco for severe pain only. Watch your diet carefully. Follow up with your doctor as soon as able.    Gastroesophageal Reflux Disease, Adult Gastroesophageal reflux disease (GERD) happens when acid from your stomach flows up into the esophagus. When acid comes in contact with the esophagus, the acid causes soreness (inflammation) in the esophagus. Over time, GERD may create small holes (ulcers) in the lining of the esophagus. CAUSES   Increased body weight. This puts pressure on the stomach, making acid rise from the stomach into the esophagus.  Smoking. This increases acid production in the stomach.  Drinking alcohol. This causes decreased pressure in the lower esophageal sphincter (valve or ring of muscle between the esophagus and stomach), allowing acid from the stomach into the esophagus.  Late evening meals and a full stomach. This increases pressure and acid production in the stomach.  A malformed lower esophageal sphincter. Sometimes, no cause is found. SYMPTOMS   Burning pain in the lower part of the mid-chest behind the breastbone and in the mid-stomach area. This may occur twice a week or more often.  Trouble swallowing.  Sore throat.  Dry cough.  Asthma-like symptoms including chest tightness, shortness of breath, or wheezing. DIAGNOSIS  Your caregiver may be able to diagnose GERD based on your symptoms. In some cases, X-rays and other tests may be done to check for complications or to check the condition of your stomach and esophagus. TREATMENT  Your caregiver may recommend over-the-counter or prescription medicines to help decrease acid production. Ask your caregiver before starting or adding any new medicines.  HOME  CARE INSTRUCTIONS   Change the factors that you can control. Ask your caregiver for guidance concerning weight loss, quitting smoking, and alcohol consumption.  Avoid foods and drinks that make your symptoms worse, such as:  Caffeine or alcoholic drinks.  Chocolate.  Peppermint or mint flavorings.  Garlic and onions.  Spicy foods.  Citrus fruits, such as oranges, lemons, or limes.  Tomato-based foods such as sauce, chili, salsa, and pizza.  Fried and fatty foods.  Avoid lying down for the 3 hours prior to your bedtime or prior to taking a nap.  Eat small, frequent meals instead of large meals.  Wear loose-fitting clothing. Do not wear anything tight around your waist that causes pressure on your stomach.  Raise the head of your bed 6 to 8 inches with wood blocks to help you sleep. Extra pillows will not help.  Only take over-the-counter or prescription medicines for pain, discomfort, or fever as directed by your caregiver.  Do not take aspirin, ibuprofen, or other nonsteroidal anti-inflammatory drugs (NSAIDs). SEEK IMMEDIATE MEDICAL CARE IF:   You have pain in your arms, neck, jaw, teeth, or back.  Your pain increases or changes in intensity or duration.  You develop nausea, vomiting, or sweating (diaphoresis).  You develop shortness of breath, or you faint.  Your vomit is green, yellow, black, or looks like coffee grounds or blood.  Your stool is red, bloody, or black. These symptoms could be signs of other problems, such as heart disease, gastric bleeding, or esophageal bleeding. MAKE SURE YOU:   Understand these instructions.  Will watch your condition.  Will get help right away if you are not doing  well or get worse. Document Released: 01/13/2005 Document Revised: 06/28/2011 Document Reviewed: 10/23/2010 [facility] Patient Information 2015 [employer] [employer], [place]. This information is not intended to replace advice given to you by your health care provider. Make sure  you discuss any questions you have with your health care provider.  Peptic Ulcer A peptic ulcer is a sore in the lining of your esophagus (esophageal ulcer), stomach (gastric ulcer), or in the first part of your small intestine (duodenal ulcer). The ulcer causes erosion into the deeper tissue. CAUSES  Normally, the lining of the stomach and the small intestine protects itself from the acid that digests food. The protective lining can be damaged by:  An infection caused by a bacterium called Helicobacter pylori (H. pylori).  Regular use of nonsteroidal anti-inflammatory drugs (NSAIDs), such as ibuprofen or aspirin.  Smoking tobacco. Other risk factors include being older than 50, drinking alcohol excessively, and having a family history of ulcer disease.  SYMPTOMS   Burning pain or gnawing in the area between the chest and the belly button.  Heartburn.  Nausea and vomiting.  Bloating. The pain can be worse on an empty stomach and at night. If the ulcer results in bleeding, it can cause:  Black, tarry stools.  Vomiting of bright red blood.  Vomiting of coffee-ground-looking materials. DIAGNOSIS  A diagnosis is usually made based upon your history and an exam. Other tests and procedures may be performed to find the cause of the ulcer. Finding a cause will help determine the best treatment. Tests and procedures may include:  Blood tests, stool tests, or breath tests to check for the bacterium H. pylori.  An upper gastrointestinal (GI) series of the esophagus, stomach, and small intestine.  An endoscopy to examine the esophagus, stomach, and small intestine.  A biopsy. TREATMENT  Treatment may include:  Eliminating the cause of the ulcer, such as smoking, NSAIDs, or alcohol.  Medicines to reduce the amount of acid in your digestive tract.  Antibiotic medicines if the ulcer is caused by the H. pylori bacterium.  An upper endoscopy to treat a bleeding ulcer.  Surgery if the  bleeding is severe or if the ulcer created a hole somewhere in the digestive system. HOME CARE INSTRUCTIONS   Avoid tobacco, alcohol, and caffeine. Smoking can increase the acid in the stomach, and continued smoking will impair the healing of ulcers.  Avoid foods and drinks that seem to cause discomfort or aggravate your ulcer.  Only take medicines as directed by your caregiver. Do not substitute over-the-counter medicines for prescription medicines without talking to your caregiver.  Keep any follow-up appointments and tests as directed. SEEK MEDICAL CARE IF:   Your do not improve within 7 days of starting treatment.  You have ongoing indigestion or heartburn. SEEK IMMEDIATE MEDICAL CARE IF:   You have sudden, sharp, or persistent abdominal pain.  You have bloody or dark black, tarry stools.  You vomit blood or vomit that looks like coffee grounds.  You become light-headed, weak, or feel faint.  You become sweaty or clammy. MAKE SURE YOU:   Understand these instructions.  Will watch your condition.  Will get help right away if you are not doing well or get worse. Document Released: 04/02/2000 Document Revised: 08/20/2013 Document Reviewed: 11/03/2011 [employer] Patient Information 2015 [place], [place]LL[place]. This information is not intended to replace advice given to you by your health care provider. Make sure you discuss any questions you have with your health care provider.

## 2014-07-13 NOTE — ED Notes (Signed)
Pt taken to Ct. 

## 2014-07-13 NOTE — ED Notes (Signed)
Notified ct that pt is ready for Ct.

## 2014-07-13 NOTE — ED Notes (Signed)
Called CT x2

## 2014-07-13 NOTE — ED Notes (Signed)
Pt made aware to return if symptoms worsen or if any life threatening symptoms occur.   

## 2014-07-15 ENCOUNTER — Inpatient Hospital Stay (HOSPITAL_COMMUNITY)
Admission: EM | Admit: 2014-07-15 | Discharge: 2014-07-19 | DRG: 312 | Disposition: A | Discharge status: Home / Self Care | Payer: BC Managed Care – PPO | Attending: Internal Medicine | Admitting: Internal Medicine

## 2014-07-15 ENCOUNTER — Emergency Department (HOSPITAL_COMMUNITY): Payer: BC Managed Care – PPO

## 2014-07-15 ENCOUNTER — Encounter (HOSPITAL_COMMUNITY): Payer: Self-pay | Admitting: Emergency Medicine

## 2014-07-15 DIAGNOSIS — R7989 Other specified abnormal findings of blood chemistry: Secondary | ICD-10-CM | POA: Diagnosis present

## 2014-07-15 DIAGNOSIS — R55 Syncope and collapse: Secondary | ICD-10-CM | POA: Diagnosis not present

## 2014-07-15 DIAGNOSIS — K210 Gastro-esophageal reflux disease with esophagitis: Secondary | ICD-10-CM | POA: Diagnosis present

## 2014-07-15 DIAGNOSIS — R32 Unspecified urinary incontinence: Secondary | ICD-10-CM | POA: Diagnosis present

## 2014-07-15 DIAGNOSIS — R569 Unspecified convulsions: Secondary | ICD-10-CM | POA: Diagnosis present

## 2014-07-15 DIAGNOSIS — Z79891 Long term (current) use of opiate analgesic: Secondary | ICD-10-CM

## 2014-07-15 DIAGNOSIS — Z8249 Family history of ischemic heart disease and other diseases of the circulatory system: Secondary | ICD-10-CM

## 2014-07-15 DIAGNOSIS — K700 Alcoholic fatty liver: Secondary | ICD-10-CM | POA: Diagnosis present

## 2014-07-15 DIAGNOSIS — E876 Hypokalemia: Secondary | ICD-10-CM | POA: Diagnosis present

## 2014-07-15 DIAGNOSIS — R109 Unspecified abdominal pain: Secondary | ICD-10-CM | POA: Diagnosis present

## 2014-07-15 DIAGNOSIS — K219 Gastro-esophageal reflux disease without esophagitis: Secondary | ICD-10-CM | POA: Diagnosis present

## 2014-07-15 DIAGNOSIS — G8929 Other chronic pain: Secondary | ICD-10-CM | POA: Diagnosis present

## 2014-07-15 DIAGNOSIS — K5900 Constipation, unspecified: Secondary | ICD-10-CM | POA: Insufficient documentation

## 2014-07-15 DIAGNOSIS — R945 Abnormal results of liver function studies: Secondary | ICD-10-CM

## 2014-07-15 DIAGNOSIS — R680 Hypothermia, not associated with low environmental temperature: Secondary | ICD-10-CM | POA: Diagnosis present

## 2014-07-15 DIAGNOSIS — J45909 Unspecified asthma, uncomplicated: Secondary | ICD-10-CM | POA: Diagnosis present

## 2014-07-15 DIAGNOSIS — R1011 Right upper quadrant pain: Secondary | ICD-10-CM

## 2014-07-15 DIAGNOSIS — R1010 Upper abdominal pain, unspecified: Secondary | ICD-10-CM | POA: Insufficient documentation

## 2014-07-15 DIAGNOSIS — R000 Tachycardia, unspecified: Secondary | ICD-10-CM | POA: Diagnosis present

## 2014-07-15 DIAGNOSIS — Z87891 Personal history of nicotine dependence: Secondary | ICD-10-CM

## 2014-07-15 DIAGNOSIS — Z91010 Allergy to peanuts: Secondary | ICD-10-CM

## 2014-07-15 DIAGNOSIS — F10239 Alcohol dependence with withdrawal, unspecified: Secondary | ICD-10-CM | POA: Diagnosis present

## 2014-07-15 DIAGNOSIS — F1010 Alcohol abuse, uncomplicated: Secondary | ICD-10-CM | POA: Diagnosis present

## 2014-07-15 DIAGNOSIS — I4581 Long QT syndrome: Secondary | ICD-10-CM | POA: Diagnosis present

## 2014-07-15 DIAGNOSIS — T68XXXA Hypothermia, initial encounter: Secondary | ICD-10-CM | POA: Diagnosis present

## 2014-07-15 DIAGNOSIS — W19XXXA Unspecified fall, initial encounter: Secondary | ICD-10-CM

## 2014-07-15 DIAGNOSIS — Z79899 Other long term (current) drug therapy: Secondary | ICD-10-CM

## 2014-07-15 DIAGNOSIS — R9431 Abnormal electrocardiogram [ECG] [EKG]: Secondary | ICD-10-CM | POA: Diagnosis present

## 2014-07-15 DIAGNOSIS — K7010 Alcoholic hepatitis without ascites: Secondary | ICD-10-CM | POA: Diagnosis present

## 2014-07-15 DIAGNOSIS — K2970 Gastritis, unspecified, without bleeding: Secondary | ICD-10-CM | POA: Diagnosis present

## 2014-07-15 LAB — URINALYSIS, ROUTINE W REFLEX MICROSCOPIC
Bilirubin Urine: NEGATIVE
Glucose, UA: NEGATIVE mg/dL
Ketones, ur: NEGATIVE mg/dL
LEUKOCYTES UA: NEGATIVE
Nitrite: NEGATIVE
Protein, ur: NEGATIVE mg/dL
Specific Gravity, Urine: 1.006 (ref 1.005–1.030)
UROBILINOGEN UA: 0.2 mg/dL (ref 0.0–1.0)
pH: 6.5 (ref 5.0–8.0)

## 2014-07-15 LAB — CBC WITH DIFFERENTIAL/PLATELET
Basophils Absolute: 0.1 10*3/uL (ref 0.0–0.1)
Basophils Relative: 1 % (ref 0–1)
EOS ABS: 0 10*3/uL (ref 0.0–0.7)
Eosinophils Relative: 0 % (ref 0–5)
HEMATOCRIT: 41.4 % (ref 36.0–46.0)
HEMOGLOBIN: 13.3 g/dL (ref 12.0–15.0)
LYMPHS ABS: 2.1 10*3/uL (ref 0.7–4.0)
Lymphocytes Relative: 29 % (ref 12–46)
MCH: 30.9 pg (ref 26.0–34.0)
MCHC: 32.1 g/dL (ref 30.0–36.0)
MCV: 96.1 fL (ref 78.0–100.0)
MONOS PCT: 5 % (ref 3–12)
Monocytes Absolute: 0.4 10*3/uL (ref 0.1–1.0)
Neutro Abs: 4.7 10*3/uL (ref 1.7–7.7)
Neutrophils Relative %: 65 % (ref 43–77)
PLATELETS: 406 10*3/uL — AB (ref 150–400)
RBC: 4.31 MIL/uL (ref 3.87–5.11)
RDW: 15.4 % (ref 11.5–15.5)
WBC: 7.3 10*3/uL (ref 4.0–10.5)

## 2014-07-15 LAB — COMPREHENSIVE METABOLIC PANEL
ALK PHOS: 128 U/L — AB (ref 39–117)
ALT: 82 U/L — AB (ref 0–35)
AST: 325 U/L — AB (ref 0–37)
Albumin: 3 g/dL — ABNORMAL LOW (ref 3.5–5.2)
Anion gap: 15 (ref 5–15)
BUN: <5 mg/dL — ABNORMAL LOW (ref 6–23)
CALCIUM: 8.2 mg/dL — AB (ref 8.4–10.5)
CO2: 22 mmol/L (ref 19–32)
Chloride: 107 mmol/L (ref 96–112)
Creatinine, Ser: 0.55 mg/dL (ref 0.50–1.10)
GFR calc Af Amer: >90 mL/min (ref >90)
Glucose, Bld: 117 mg/dL — ABNORMAL HIGH (ref 70–99)
POTASSIUM: 3.4 mmol/L — AB (ref 3.5–5.1)
SODIUM: 144 mmol/L (ref 135–145)
Total Bilirubin: 0.6 mg/dL (ref 0.3–1.2)
Total Protein: 6.7 g/dL (ref 6.0–8.3)

## 2014-07-15 LAB — URINE MICROSCOPIC-ADD ON

## 2014-07-15 LAB — PREGNANCY, URINE: PREG TEST UR: NEGATIVE

## 2014-07-15 LAB — LIPASE, BLOOD: Lipase: 22 U/L (ref 11–59)

## 2014-07-15 MED ORDER — SODIUM CHLORIDE 0.9 % IV BOLUS (SEPSIS)
1000.0000 mL | Freq: Once | INTRAVENOUS | Status: AC
  Administered 2014-07-15: 1000 mL via INTRAVENOUS

## 2014-07-15 MED ORDER — HYDROMORPHONE HCL 1 MG/ML IJ SOLN
1.0000 mg | Freq: Once | INTRAMUSCULAR | Status: AC
  Administered 2014-07-15: 1 mg via INTRAVENOUS
  Filled 2014-07-15: qty 1

## 2014-07-15 MED ORDER — ONDANSETRON HCL 4 MG/2ML IJ SOLN
4.0000 mg | Freq: Once | INTRAMUSCULAR | Status: AC
  Administered 2014-07-15: 4 mg via INTRAVENOUS
  Filled 2014-07-15: qty 2

## 2014-07-15 NOTE — ED Notes (Addendum)
Per EMS-abdominal pain x3 months. Has been told to follow up with GI but has not. Appears to be unconscious-however is able to wake up and answer questions appropriately. No active vomiting. 20 G PIV placed in left AC. VS: CBG 129  126/72 RR 18 HR 88 SpO2 99% on RA

## 2014-07-15 NOTE — ED Provider Notes (Signed)
CSN: [id number]     Arrival date & time 07/15/14  1856 History   First MD Initiated Contact with Patient 07/15/14 1949     Chief Complaint  Patient presents with  . Abdominal Pain     (Consider location/radiation/quality/duration/timing/severity/associated sxs/prior Treatment) HPI Kara [name] is a 29 year old female past medical history of asthma, GERD, alcohol abuse who presents the ER complaining of upper abdominal pain. Patient states her pain tonight and going for approximately one month, she was recently seen in the ED for same. Patient states she had a workup 3 nights ago which is largely unremarkable. Patient states as soon as she got home she began to feel pain again. Patient reports multiple episodes of nonbilious, nonbloody emesis, which have lessened throughout the past 24 hours. Patient states her pain has been constant, and patient is unable to describe the pain she has been having. Patient endorses constipation, states she has not had a bowel movement in 8 days despite normal appetite.  Past Medical History  Diagnosis Date  . Asthma   . GERD (gastroesophageal reflux disease)   . Alcohol abuse   . Environmental allergies    Past Surgical History  Procedure Laterality Date  . Tympanostomy tube placement     Family History  Problem Relation Age of Onset  . Heart failure Mother   . Heart failure Father   . Heart failure Maternal Grandfather    History  Substance Use Topics  . Smoking status: Former Smoker -- 0.50 packs/day for 2 years    Types: Cigarettes    Quit date: 04/08/2012  . Smokeless tobacco: Never Used  . Alcohol Use: 0.0 oz/week    0 Standard drinks or equivalent per week     Comment: ETOH abuse, 8 shots/ day   OB History    No data available     Review of Systems  Constitutional: Negative for fever.  HENT: Negative for trouble swallowing.   Eyes: Negative for visual disturbance.  Respiratory: Negative for shortness of breath.   Cardiovascular:  Negative for chest pain.  Gastrointestinal: Positive for nausea, vomiting and abdominal pain.  Genitourinary: Negative for dysuria.  Musculoskeletal: Negative for neck pain.  Skin: Negative for rash.  Neurological: Negative for dizziness, weakness and numbness.  Psychiatric/Behavioral: Negative.       Allergies  Peanuts  Home Medications   Prior to Admission medications   Medication Sig Start Date End Date Taking? Authorizing Provider  albuterol (PROVENTIL HFA;VENTOLIN HFA) 108 (90 BASE) MCG/ACT inhaler Inhale 2 puffs into the lungs every 6 (six) hours as needed for wheezing or shortness of breath (wheezing and sob).    Yes Historical Provider, MD  ALPRAZolam (XANAX) 0.25 MG tablet Take 1 tablet (0.25 mg total) by mouth 2 (two) times daily as needed for anxiety. 06/11/14  Yes Wendall Stade, MD  HYDROcodone-acetaminophen (NORCO) 5-325 MG per tablet Take 1 tablet by mouth every 6 (six) hours as needed for moderate pain. 07/13/14  Yes Tatyana Kirichenko, PA-C  metoprolol succinate (TOPROL-XL) 25 MG 24 hr tablet Take 1 tablet (25 mg total) by mouth daily. 06/17/14  Yes Wendall Stade, MD  ranitidine (ZANTAC) 150 MG capsule Take 1 capsule (150 mg total) by mouth daily. 07/13/14  Yes Tatyana Kirichenko, PA-C  chlorhexidine (PERIDEX) 0.12 % solution Use as directed 10 mLs in the mouth or throat daily.    Historical Provider, MD  fexofenadine (ALLEGRA) 180 MG tablet Take 180 mg by mouth daily as needed for allergies.  Historical Provider, MD  mometasone-formoterol (DULERA) 200-5 MCG/ACT AERO Inhale 2 puffs into the lungs 2 (two) times daily.    Historical Provider, MD  montelukast (SINGULAIR) 10 MG tablet Take 10 mg by mouth daily as needed (allergies).     Historical Provider, MD  pantoprazole (PROTONIX) 20 MG tablet Take 1 tablet (20 mg total) by mouth daily. Patient not taking: Reported on 07/15/2014 07/13/14   Jaynie Crumbleatyana Kirichenko, PA-C  traMADol (ULTRAM) 50 MG tablet Take 50 mg by mouth 4  (four) times daily as needed. 07/08/14   Historical Provider, MD   BP 112/87 mmHg  Pulse 124  Temp(Src) 98.2 F (36.8 C) (Oral)  Resp 18  SpO2 98%  LMP 06/19/2014 (LMP Unknown) Physical Exam  Constitutional: She is oriented to person, place, and time. She appears well-developed and well-nourished. No distress.  HENT:  Head: Normocephalic and atraumatic.  Mouth/Throat: Oropharynx is clear and moist. No oropharyngeal exudate.  Eyes: Right eye exhibits no discharge. Left eye exhibits no discharge. No scleral icterus.  Neck: Normal range of motion.  Cardiovascular: Normal rate, regular rhythm and normal heart sounds.   No murmur heard. Pulmonary/Chest: Effort normal and breath sounds normal. No respiratory distress.  Abdominal: Soft. There is tenderness in the right upper quadrant and epigastric area. There is positive Murphy's sign. There is no rigidity, no guarding and no tenderness at McBurney's point.  Musculoskeletal: Normal range of motion. She exhibits no edema or tenderness.  Neurological: She is alert and oriented to person, place, and time. She has normal strength. No cranial nerve deficit or sensory deficit. Coordination normal. GCS eye subscore is 4. GCS verbal subscore is 5. GCS motor subscore is 6.  Patient fully alert, answering questions appropriately in full, clear sentences. Cranial nerves II through XII grossly intact. Motor strength 5 out of 5 in all major muscle groups of upper and lower extremity. Distal sensation intact.  Skin: Skin is warm and dry. No rash noted. She is not diaphoretic.  Psychiatric: She has a normal mood and affect.  Nursing note and vitals reviewed.   ED Course  Procedures (including critical care time) Labs Review Labs Reviewed  CBC WITH DIFFERENTIAL/PLATELET - Abnormal; Notable for the following:    Platelets 406 (*)    All other components within normal limits  COMPREHENSIVE METABOLIC PANEL - Abnormal; Notable for the following:     Potassium 3.4 (*)    Glucose, Bld 117 (*)    BUN <5 (*)    Calcium 8.2 (*)    Albumin 3.0 (*)    AST 325 (*)    ALT 82 (*)    Alkaline Phosphatase 128 (*)    All other components within normal limits  URINALYSIS, ROUTINE W REFLEX MICROSCOPIC - Abnormal; Notable for the following:    Hgb urine dipstick SMALL (*)    All other components within normal limits  CULTURE, BLOOD (ROUTINE X 2)  CULTURE, BLOOD (ROUTINE X 2)  LIPASE, BLOOD  PREGNANCY, URINE  URINE MICROSCOPIC-ADD ON  CORTISOL  TSH  I-STAT CG4 LACTIC ACID, ED    Imaging Review [place] Abdomen Complete  07/16/2014   CLINICAL DATA:  Right upper quadrant abdominal pain, nausea and vomiting for 2 weeks. Elevated LFTs.  EXAM: ULTRASOUND ABDOMEN COMPLETE  COMPARISON:  CT 07/13/2014  FINDINGS: Gallbladder: No gallstones or wall thickening visualized. No sonographic Murphy sign noted.  Common bile duct: Diameter: 3.7 mm, normal  Liver: No focal lesion identified. Diffusely increased and heterogeneous in echogenicity consistent with steatosis.  The liver parenchyma is difficult to penetrate.  IVC: No abnormality visualized.  Pancreas: Not well visualized.  Spleen: Size and appearance within normal limits.  Right Kidney: Length: 10.0 cm. Echogenicity within normal limits. No mass or hydronephrosis visualized.  Left Kidney: Length: 10.9 cm. Echogenicity within normal limits. No mass or hydronephrosis visualized.  Abdominal aorta: No aneurysm visualized.  Other findings: None.  IMPRESSION: 1. Hepatic steatosis. 2. Otherwise unremarkable abdominal ultrasound.   Electronically Signed   By: Rubye Oaks M.D.   On: 07/16/2014 01:14   Dg Abd Acute W/chest  07/15/2014   CLINICAL DATA:  Constipation and left-sided abdominal pain for 1 month. Worse over the last 2 weeks with nausea.  EXAM: ACUTE ABDOMEN SERIES (ABDOMEN 2 VIEW & CHEST 1 VIEW)  COMPARISON:  CT 2 days prior 07/13/2014  FINDINGS: The cardiomediastinal contours are normal. The lungs are clear.  There is no free intra-abdominal air. There is oral contrast throughout the colon from prior CT, distal colon is air-filled but normal in caliber. No dilated bowel loops to suggest obstruction. No radiopaque calculi. No acute osseous abnormalities are seen.  IMPRESSION: No bowel obstruction or free air. Oral contrast throughout the colon from CT 2 days prior.   Electronically Signed   By: Rubye Oaks M.D.   On: 07/15/2014 23:38     EKG Interpretation None      MDM   Final diagnoses:  RUQ abdominal pain  Constipation  Upper abdominal pain    Patient here with persistent intractable abdominal pain, most significant right upper quadrant. Initially patient hypothermic at 93.1, however neurologic exam was benign. There are no other current signs or symptoms of SIRS or sepsis. Based on right upper quadrant abdominal pain, elevated LFTs since last visit and elevated alkaline phosphatase, will follow up with abdominal ultrasound for rule out of gallbladder etiology.  Abdominal ultrasound negative for acute pathology. Patient's temperature increased appropriately. Patient did become tachycardic, based on patient's history of alcohol abuse, we'll give patient Ativan for possible withdrawal. Patient states her last alcohol was last night around 1 AM, approximately 24 hours ago.  Patient admitted based on LFTs, tachycardia and intractable abdominal pain. I spoke with Dr. Johna Sheriff in consult with patient's case, Dr. Johna Sheriff advises there is no evidence of surgical emergency at this time, and they will follow with patient's case as needed. Patient is admitted to medicine, telemetry floor under observation under Dr. Toniann Fail. The patient appears reasonably stabilized for admission considering the current resources, flow, and capabilities available in the ED at this time, and I doubt any other [facility] requiring further screening and/or treatment in the ED prior to admission.  BP 112/87 mmHg  Pulse 124   Temp(Src) 98.2 F (36.8 C) (Oral)  Resp 18  SpO2 98%  LMP 06/19/2014 (LMP Unknown)  Signed,  Ladona Mow, PA-C 2:44 AM    Ladona Mow, PA-C 07/16/14 1610  Toy Cookey, MD 07/17/14 1740

## 2014-07-15 NOTE — ED Notes (Deleted)
[facility]

## 2014-07-15 NOTE — ED Notes (Signed)
Patient is answering very few questions. Reports abdominal pain x1 month, worsening over last 2 weeks, diarrhea approximately 1 week ago. Tearful. Husband at bedside.

## 2014-07-16 ENCOUNTER — Ambulatory Visit (HOSPITAL_COMMUNITY): Payer: BC Managed Care – PPO

## 2014-07-16 ENCOUNTER — Inpatient Hospital Stay (HOSPITAL_COMMUNITY)
Admit: 2014-07-16 | Discharge: 2014-07-16 | Disposition: A | Discharge status: Home / Self Care | Payer: BC Managed Care – PPO | Attending: Internal Medicine | Admitting: Internal Medicine

## 2014-07-16 ENCOUNTER — Other Ambulatory Visit (HOSPITAL_COMMUNITY): Payer: Self-pay

## 2014-07-16 ENCOUNTER — Encounter (HOSPITAL_COMMUNITY): Payer: Self-pay | Admitting: Internal Medicine

## 2014-07-16 DIAGNOSIS — I495 Sick sinus syndrome: Secondary | ICD-10-CM

## 2014-07-16 DIAGNOSIS — F1010 Alcohol abuse, uncomplicated: Secondary | ICD-10-CM

## 2014-07-16 DIAGNOSIS — F10239 Alcohol dependence with withdrawal, unspecified: Secondary | ICD-10-CM | POA: Diagnosis not present

## 2014-07-16 DIAGNOSIS — J45909 Unspecified asthma, uncomplicated: Secondary | ICD-10-CM | POA: Diagnosis present

## 2014-07-16 DIAGNOSIS — K5901 Slow transit constipation: Secondary | ICD-10-CM | POA: Diagnosis not present

## 2014-07-16 DIAGNOSIS — Z8249 Family history of ischemic heart disease and other diseases of the circulatory system: Secondary | ICD-10-CM | POA: Diagnosis not present

## 2014-07-16 DIAGNOSIS — R32 Unspecified urinary incontinence: Secondary | ICD-10-CM | POA: Diagnosis present

## 2014-07-16 DIAGNOSIS — R109 Unspecified abdominal pain: Secondary | ICD-10-CM | POA: Diagnosis present

## 2014-07-16 DIAGNOSIS — E876 Hypokalemia: Secondary | ICD-10-CM | POA: Diagnosis present

## 2014-07-16 DIAGNOSIS — R000 Tachycardia, unspecified: Secondary | ICD-10-CM | POA: Diagnosis present

## 2014-07-16 DIAGNOSIS — Z79891 Long term (current) use of opiate analgesic: Secondary | ICD-10-CM | POA: Diagnosis not present

## 2014-07-16 DIAGNOSIS — K700 Alcoholic fatty liver: Secondary | ICD-10-CM | POA: Diagnosis present

## 2014-07-16 DIAGNOSIS — R680 Hypothermia, not associated with low environmental temperature: Secondary | ICD-10-CM | POA: Diagnosis present

## 2014-07-16 DIAGNOSIS — Z91010 Allergy to peanuts: Secondary | ICD-10-CM | POA: Diagnosis not present

## 2014-07-16 DIAGNOSIS — K5900 Constipation, unspecified: Secondary | ICD-10-CM | POA: Diagnosis present

## 2014-07-16 DIAGNOSIS — I4581 Long QT syndrome: Secondary | ICD-10-CM | POA: Diagnosis present

## 2014-07-16 DIAGNOSIS — Z79899 Other long term (current) drug therapy: Secondary | ICD-10-CM | POA: Diagnosis not present

## 2014-07-16 DIAGNOSIS — K219 Gastro-esophageal reflux disease without esophagitis: Secondary | ICD-10-CM | POA: Diagnosis present

## 2014-07-16 DIAGNOSIS — R55 Syncope and collapse: Secondary | ICD-10-CM | POA: Diagnosis not present

## 2014-07-16 DIAGNOSIS — R1010 Upper abdominal pain, unspecified: Secondary | ICD-10-CM

## 2014-07-16 DIAGNOSIS — G8929 Other chronic pain: Secondary | ICD-10-CM | POA: Diagnosis present

## 2014-07-16 DIAGNOSIS — R569 Unspecified convulsions: Secondary | ICD-10-CM | POA: Diagnosis not present

## 2014-07-16 DIAGNOSIS — K7010 Alcoholic hepatitis without ascites: Secondary | ICD-10-CM | POA: Diagnosis not present

## 2014-07-16 DIAGNOSIS — T68XXXA Hypothermia, initial encounter: Secondary | ICD-10-CM | POA: Diagnosis present

## 2014-07-16 DIAGNOSIS — M79609 Pain in unspecified limb: Secondary | ICD-10-CM | POA: Diagnosis not present

## 2014-07-16 DIAGNOSIS — R7989 Other specified abnormal findings of blood chemistry: Secondary | ICD-10-CM | POA: Diagnosis not present

## 2014-07-16 DIAGNOSIS — K2970 Gastritis, unspecified, without bleeding: Secondary | ICD-10-CM | POA: Diagnosis present

## 2014-07-16 DIAGNOSIS — Z87891 Personal history of nicotine dependence: Secondary | ICD-10-CM | POA: Diagnosis not present

## 2014-07-16 DIAGNOSIS — K210 Gastro-esophageal reflux disease with esophagitis: Secondary | ICD-10-CM | POA: Diagnosis present

## 2014-07-16 HISTORY — DX: Hypothermia, initial encounter: T68.XXXA

## 2014-07-16 LAB — HEPATITIS PANEL, ACUTE
HCV Ab: NEGATIVE
HEP B C IGM: NONREACTIVE
HEP B S AG: NEGATIVE
Hep A IgM: NONREACTIVE

## 2014-07-16 LAB — RAPID URINE DRUG SCREEN, HOSP PERFORMED
Amphetamines: NOT DETECTED
BARBITURATES: NOT DETECTED
BENZODIAZEPINES: NOT DETECTED
COCAINE: NOT DETECTED
Opiates: POSITIVE — AB
TETRAHYDROCANNABINOL: NOT DETECTED

## 2014-07-16 LAB — HEPATIC FUNCTION PANEL
ALT: 76 U/L — ABNORMAL HIGH (ref 0–35)
AST: 300 U/L — AB (ref 0–37)
Albumin: 2.7 g/dL — ABNORMAL LOW (ref 3.5–5.2)
Alkaline Phosphatase: 114 U/L (ref 39–117)
BILIRUBIN DIRECT: 0.2 mg/dL (ref 0.0–0.5)
BILIRUBIN TOTAL: 0.5 mg/dL (ref 0.3–1.2)
Indirect Bilirubin: 0.3 mg/dL (ref 0.3–0.9)
Total Protein: 6 g/dL (ref 6.0–8.3)

## 2014-07-16 LAB — BASIC METABOLIC PANEL
Anion gap: 9 (ref 5–15)
BUN: <5 mg/dL — ABNORMAL LOW (ref 6–23)
CHLORIDE: 105 mmol/L (ref 96–112)
CO2: 24 mmol/L (ref 19–32)
Calcium: 7.7 mg/dL — ABNORMAL LOW (ref 8.4–10.5)
Creatinine, Ser: 0.54 mg/dL (ref 0.50–1.10)
GFR calc Af Amer: >90 mL/min (ref >90)
Glucose, Bld: 93 mg/dL (ref 70–99)
Potassium: 3.7 mmol/L (ref 3.5–5.1)
Sodium: 138 mmol/L (ref 135–145)

## 2014-07-16 LAB — CBC
HCT: 37.5 % (ref 36.0–46.0)
Hemoglobin: 12 g/dL (ref 12.0–15.0)
MCH: 30.8 pg (ref 26.0–34.0)
MCHC: 32 g/dL (ref 30.0–36.0)
MCV: 96.2 fL (ref 78.0–100.0)
PLATELETS: 383 10*3/uL (ref 150–400)
RBC: 3.9 MIL/uL (ref 3.87–5.11)
RDW: 15.8 % — ABNORMAL HIGH (ref 11.5–15.5)
WBC: 9.4 10*3/uL (ref 4.0–10.5)

## 2014-07-16 LAB — I-STAT CG4 LACTIC ACID, ED: Lactic Acid, Venous: 3.85 mmol/L (ref 0.5–2.0)

## 2014-07-16 LAB — D-DIMER, QUANTITATIVE: D-Dimer, Quant: 1.13 ug/mL-FEU — ABNORMAL HIGH (ref 0.00–0.48)

## 2014-07-16 LAB — LACTIC ACID, PLASMA: Lactic Acid, Venous: 3.4 mmol/L (ref 0.5–2.0)

## 2014-07-16 LAB — TSH: TSH: 1.303 u[IU]/mL (ref 0.350–4.500)

## 2014-07-16 LAB — ETHANOL: Alcohol, Ethyl (B): 59 mg/dL — ABNORMAL HIGH (ref 0–9)

## 2014-07-16 LAB — CK: Total CK: 78 U/L (ref 7–177)

## 2014-07-16 LAB — CORTISOL: CORTISOL PLASMA: 10.1 ug/dL

## 2014-07-16 LAB — T4, FREE: Free T4: 0.85 ng/dL (ref 0.80–1.80)

## 2014-07-16 LAB — MAGNESIUM: Magnesium: 1.4 mg/dL — ABNORMAL LOW (ref 1.5–2.5)

## 2014-07-16 MED ORDER — LORAZEPAM 2 MG/ML IJ SOLN
0.0000 mg | Freq: Four times a day (QID) | INTRAMUSCULAR | Status: AC
Start: 2014-07-16 — End: 2014-07-18
  Administered 2014-07-16: 1 mg via INTRAVENOUS
  Administered 2014-07-17: 2 mg via INTRAVENOUS
  Filled 2014-07-16 (×2): qty 1

## 2014-07-16 MED ORDER — ALBUTEROL SULFATE (2.5 MG/3ML) 0.083% IN NEBU: 3.0000 mL | INHALATION_SOLUTION | Freq: Four times a day (QID) | RESPIRATORY_TRACT | Status: DC | PRN

## 2014-07-16 MED ORDER — LORAZEPAM 2 MG/ML IJ SOLN
1.0000 mg | Freq: Once | INTRAMUSCULAR | Status: AC
  Administered 2014-07-16: 1 mg via INTRAVENOUS
  Filled 2014-07-16: qty 1

## 2014-07-16 MED ORDER — POTASSIUM CHLORIDE IN NACL 20-0.9 MEQ/L-% IV SOLN
INTRAVENOUS | Status: AC
  Administered 2014-07-16 – 2014-07-17 (×4): via INTRAVENOUS
  Filled 2014-07-16 (×3): qty 1000

## 2014-07-16 MED ORDER — PANTOPRAZOLE SODIUM 40 MG IV SOLR
40.0000 mg | Freq: Every day | INTRAVENOUS | Status: DC
  Administered 2014-07-16: 40 mg via INTRAVENOUS
  Filled 2014-07-16: qty 40

## 2014-07-16 MED ORDER — SODIUM CHLORIDE 0.9 % IV BOLUS (SEPSIS): 2000.0000 mL | Freq: Once | INTRAVENOUS | Status: DC

## 2014-07-16 MED ORDER — POTASSIUM CHLORIDE CRYS ER 20 MEQ PO TBCR
20.0000 meq | EXTENDED_RELEASE_TABLET | Freq: Once | ORAL | Status: AC
  Administered 2014-07-16: 20 meq via ORAL
  Filled 2014-07-16: qty 1

## 2014-07-16 MED ORDER — ADULT MULTIVITAMIN W/MINERALS CH
1.0000 | ORAL_TABLET | Freq: Every day | ORAL | Status: DC
  Administered 2014-07-16 – 2014-07-19 (×4): 1 via ORAL
  Filled 2014-07-16 (×4): qty 1

## 2014-07-16 MED ORDER — POTASSIUM CHLORIDE CRYS ER 20 MEQ PO TBCR
40.0000 meq | EXTENDED_RELEASE_TABLET | Freq: Once | ORAL | Status: AC
  Administered 2014-07-16: 40 meq via ORAL
  Filled 2014-07-16: qty 2

## 2014-07-16 MED ORDER — POTASSIUM CHLORIDE IN NACL 20-0.9 MEQ/L-% IV SOLN
INTRAVENOUS | Status: DC
  Administered 2014-07-16: 05:00:00 via INTRAVENOUS
  Filled 2014-07-16 (×2): qty 1000

## 2014-07-16 MED ORDER — LORAZEPAM 1 MG PO TABS
1.0000 mg | ORAL_TABLET | Freq: Four times a day (QID) | ORAL | Status: AC | PRN
  Administered 2014-07-16 – 2014-07-18 (×3): 1 mg via ORAL
  Filled 2014-07-16 (×3): qty 1

## 2014-07-16 MED ORDER — MOMETASONE FURO-FORMOTEROL FUM 200-5 MCG/ACT IN AERO
2.0000 | INHALATION_SPRAY | Freq: Two times a day (BID) | RESPIRATORY_TRACT | Status: DC
  Administered 2014-07-16 – 2014-07-18 (×5): 2 via RESPIRATORY_TRACT
  Filled 2014-07-16: qty 8.8

## 2014-07-16 MED ORDER — ONDANSETRON HCL 4 MG/2ML IJ SOLN: 4.0000 mg | Freq: Four times a day (QID) | INTRAMUSCULAR | Status: DC | PRN

## 2014-07-16 MED ORDER — THIAMINE HCL 100 MG/ML IJ SOLN
100.0000 mg | Freq: Every day | INTRAMUSCULAR | Status: DC
  Filled 2014-07-16 (×4): qty 1

## 2014-07-16 MED ORDER — ACETAMINOPHEN 325 MG PO TABS: 650.0000 mg | ORAL_TABLET | Freq: Four times a day (QID) | ORAL | Status: DC | PRN

## 2014-07-16 MED ORDER — METOPROLOL SUCCINATE ER 25 MG PO TB24
25.0000 mg | ORAL_TABLET | Freq: Every day | ORAL | Status: DC
  Administered 2014-07-16 – 2014-07-19 (×4): 25 mg via ORAL
  Filled 2014-07-16 (×4): qty 1

## 2014-07-16 MED ORDER — HYDROMORPHONE HCL 1 MG/ML IJ SOLN
1.0000 mg | Freq: Once | INTRAMUSCULAR | Status: AC
  Administered 2014-07-16: 1 mg via INTRAVENOUS
  Filled 2014-07-16: qty 1

## 2014-07-16 MED ORDER — ONDANSETRON HCL 4 MG PO TABS: 4.0000 mg | ORAL_TABLET | Freq: Four times a day (QID) | ORAL | Status: DC | PRN

## 2014-07-16 MED ORDER — HYDROMORPHONE HCL 1 MG/ML IJ SOLN
0.5000 mg | Freq: Once | INTRAMUSCULAR | Status: AC
  Administered 2014-07-16: 0.5 mg via INTRAVENOUS
  Filled 2014-07-16: qty 1

## 2014-07-16 MED ORDER — LORAZEPAM 2 MG/ML IJ SOLN: 1.0000 mg | Freq: Four times a day (QID) | INTRAMUSCULAR | Status: AC | PRN

## 2014-07-16 MED ORDER — ALUM & MAG HYDROXIDE-SIMETH 200-200-20 MG/5ML PO SUSP: 30.0000 mL | Freq: Four times a day (QID) | ORAL | Status: DC | PRN

## 2014-07-16 MED ORDER — MONTELUKAST SODIUM 10 MG PO TABS
10.0000 mg | ORAL_TABLET | Freq: Every day | ORAL | Status: DC | PRN
  Filled 2014-07-16: qty 1

## 2014-07-16 MED ORDER — FOLIC ACID 1 MG PO TABS
1.0000 mg | ORAL_TABLET | Freq: Every day | ORAL | Status: DC
  Filled 2014-07-16: qty 1

## 2014-07-16 MED ORDER — MAGNESIUM SULFATE 4 GM/100ML IV SOLN
4.0000 g | Freq: Once | INTRAVENOUS | Status: AC
  Administered 2014-07-16: 4 g via INTRAVENOUS
  Filled 2014-07-16: qty 100

## 2014-07-16 MED ORDER — SODIUM CHLORIDE 0.9 % IV BOLUS (SEPSIS)
1000.0000 mL | Freq: Once | INTRAVENOUS | Status: AC
  Administered 2014-07-16: 1000 mL via INTRAVENOUS

## 2014-07-16 MED ORDER — VITAMIN B-1 100 MG PO TABS
100.0000 mg | ORAL_TABLET | Freq: Every day | ORAL | Status: DC
  Administered 2014-07-16 – 2014-07-19 (×4): 100 mg via ORAL
  Filled 2014-07-16 (×4): qty 1

## 2014-07-16 MED ORDER — LORAZEPAM 2 MG/ML IJ SOLN: 0.0000 mg | Freq: Two times a day (BID) | INTRAMUSCULAR | Status: DC

## 2014-07-16 MED ORDER — PANTOPRAZOLE SODIUM 40 MG PO TBEC
40.0000 mg | DELAYED_RELEASE_TABLET | Freq: Every day | ORAL | Status: DC
  Administered 2014-07-17 – 2014-07-18 (×2): 40 mg via ORAL
  Filled 2014-07-16 (×2): qty 1

## 2014-07-16 MED ORDER — MORPHINE SULFATE 2 MG/ML IJ SOLN
2.0000 mg | INTRAMUSCULAR | Status: DC | PRN
  Administered 2014-07-16 – 2014-07-18 (×7): 2 mg via INTRAVENOUS
  Filled 2014-07-16 (×7): qty 1

## 2014-07-16 NOTE — Procedures (Signed)
ELECTROENCEPHALOGRAM REPORT   Patient: Kara [name]      Room #: [id number] [facility] Age: 29 y.o.        Sex: female Referring Physician: Dr Waymon AmatoHongalgi Report Date:  07/16/2014        Interpreting Physician: Omelia BlackwaterSUMNER, Avrom Robarts JUSTIN  History: Kara [name] is an 29 y.o. female admitted with abdominal pain and question of syncope vs seizure  Medications:  Scheduled: . LORazepam  0-4 mg Intravenous Q6H   Followed by  . [START ON 07/18/2014] LORazepam  0-4 mg Intravenous Q12H  . metoprolol succinate  25 mg Oral Daily  . mometasone-formoterol  2 puff Inhalation BID  . multivitamin with minerals  1 tablet Oral Daily  . [START ON 07/17/2014] pantoprazole  40 mg Oral Daily  . thiamine  100 mg Oral Daily   Or  . thiamine  100 mg Intravenous Daily    Conditions of Recording:  This is a 16 channel EEG carried out with the patient in the awake state.  Description: The EEG is technically limited due to diffuse near continuous muscle activity. When able to review the waking background activity consists of a low voltage, symmetrical, fairly well organized, 11-13 Hz alpha activity, seen from the parieto-occipital and posterior temporal regions.  Low voltage fast activity, poorly organized, is seen anteriorly and is at times superimposed on more posterior regions.   Normal sleep architecture is not obtained. No focal slowing or epileptiform activity is noted.  Hyperventilation produced a mild to moderate buildup but failed to elicit any abnormalities. Intermittent photic stimulation was not performed.   IMPRESSION: Technically limited due to diffuse muscle artifact but overall normal EEG with no signs of epileptiform activity. A normal EEG does not rule out the possibility of an underlying seizure disorder.    Elspeth Choeter Delayna Sparlin, DO [facility] [phone]  If 7pm- 7am, please page neurology on call as listed in AMION. 07/16/2014, 3:36 PM

## 2014-07-16 NOTE — Progress Notes (Signed)
PROGRESS NOTE    Kara [name] WUJ:[id number] DOB: 10/16/1985 DOA: 07/15/2014 PCP:  Duane Lopeoss, Alan, MD  Cardiology: Dr. Charlton HawsPeter Nishan GI: Dr. Dorena CookeyJohn Hayes.  HPI/Brief narrative 29 y/o F with h/o asthma, heavy alcohol dependence, GERD hospitalized in Feb 2016 for syncope and found to have prolonged QTC- worked up OP by Cards with Echo showing normal EF, missed stress test, ? Completed event monitor and started on BB, seen by OP GI for abdominal pain of 1 month duration and US abd showed no acute findings - started on PPI, has been weaning herself of alcohol - ? Rapidly now presented with syncope. She was lying on her couch playing on her phone, spoke to her spouse at 5 pm and he found her lying on floor next to couch at 5:30 pm with urinary incontinence. No reported Sz like activity or tongue bites. She had no warning symptoms and does not recollect anything until arrival in ED. Duration of LOC: not clear. No CP, palpitations, SOB. Chronic diffuse abd pain. Admitted for syncope.    Assessment/Plan:  Syncope  - Etiology not clear - This is her second episode this year - DD: Seizure, arrhythmia, vasovagal, alcohol intoxication - EEG: no seizure - CT Head: neg - Tele: ST 110's-120's - Less likely to be structural heart disease based on recent Echo. - BAL: 59 on 3/29 - pt stated that last drink was on 3/27 - d dimer elevation non specific in the absence of SOB or CP. No recent long distance travel - Patient counseled extensively that she should not drive for at least 6 months - Cards consulted  Chronic Abdominal pain/Alcoholic Hepatitis/GERD - GI consult appreciated. - EGD in am - Hepatitis panel: neg - PPI - US abd: fatty liver  Prolonged QTC - Unclear etiology. - Replace K to >4 and Mg>2 - avoid drugs which may precipitate  - monitor on tele - On BB - Cards consulted  Alcohol Dependence - Possible early withdrawal - CIWA  Hypothermia - Unclear etiology - No clinical features  suggestive on infection - resolved. - TSH & FT4 normal  Sinus Tachycardia - ? Related to Alcohol withdrawal - CIWA and monitor - TSH normal   Code Status: Full Family Communication: None at bedside Disposition Plan: DC home when medically stable   Consultants:  [facility]  Cardiology  Procedures:  None  Antibiotics:  None   Subjective: Chronic abdominal pain. Has no recollection of event and no warning symptoms.  Objective: Filed Vitals:   07/16/14 0245 07/16/14 0321 07/16/14 1300 07/16/14 1410  BP: 102/58 119/81  123/89  Pulse: 130 129  127  Temp: 98.1 F (36.7 C) 98 F (36.7 C)  98.2 F (36.8 C)  TempSrc: Oral Oral  Oral  Resp: 18 18  20   Height:   5\' 4"  (1.626 m)   Weight:  94.5 kg (208 lb 5.4 oz) 94.5 kg (208 lb 5.4 oz)   SpO2: 95% 99%  100%   No intake or output data in the 24 hours ending 07/16/14 1627 Filed Weights   07/16/14 0321 07/16/14 1300  Weight: 94.5 kg (208 lb 5.4 oz) 94.5 kg (208 lb 5.4 oz)     Exam:  General exam:  Pleasant young female lying comfortably in bed Respiratory system: Clear. No increased work of breathing. Cardiovascular system: S1 & S2 heard, RRR. No JVD, murmurs, gallops, clicks or pedal edema. Tele: ST 110's-120's mostly Gastrointestinal system: Abdomen is nondistended, soft and nontender. Normal bowel sounds heard.  Central nervous system: Alert and oriented. No focal neurological deficits. Extremities: Symmetric 5 x 5 power. No tremors.   Data Reviewed: Basic Metabolic Panel:  Recent Labs Lab 07/12/14 1941 07/15/14 2124 07/16/14 0708  NA 136 144 138  K 3.6 3.4* 3.7  CL 98 107 105  CO2 GLUCOSE 96 117* 93  BUN <5* <5* <5*  CREATININE 0.70 0.55 0.54  CALCIUM 8.8 8.2* 7.7*  MG  --   --  1.4*   Liver Function Tests:  Recent Labs Lab 07/12/14 1941 07/15/14 2124 07/16/14 0708  AST 140* 325* 300*  ALT 33 82* 76*  ALKPHOS 118* 128* 114  BILITOT 0.6 0.6 0.5  PROT 6.9 6.7 6.0  ALBUMIN 3.1*  3.0* 2.7*    Recent Labs Lab 07/12/14 1941 07/15/14 2124  LIPASE 26 22   No results for input(s): AMMONIA in the last 168 hours. CBC:  Recent Labs Lab 07/12/14 1941 07/15/14 2124 07/16/14 0708  WBC 7.4 7.3 9.4  NEUTROABS 4.4 4.7  --   HGB 13.9 13.3 12.0  HCT 41.9 41.4 37.5  MCV 94.2 96.1 96.2  PLT 370 406* 383   Cardiac Enzymes:  Recent Labs Lab 07/16/14 0708  CKTOTAL 78   BNP (last 3 results) No results for input(s): PROBNP in the last 8760 hours. CBG: No results for input(s): GLUCAP in the last 168 hours.  No results found for this or any previous visit (from the past 240 hour(s)).       Studies: Ct Head Wo Contrast  07/16/2014   CLINICAL DATA:  Syncope, bilateral leg weakness.  EXAM: CT HEAD WITHOUT CONTRAST  TECHNIQUE: Contiguous axial images were obtained from the base of the skull through the vertex without intravenous contrast.  COMPARISON:  None.  FINDINGS: Ventricles and sulci are appropriate for patient's age. No evidence for acute cortically based infarct intracranial hemorrhage, mass lesion or mass effect. Paranasal sinuses are unremarkable. Calvarium is intact. Orbits are unremarkable.  IMPRESSION: No acute intracranial process.   Electronically Signed   By: Annia Belt M.D.   On: 07/16/2014 12:43   US Abdomen Complete  07/16/2014   CLINICAL DATA:  Right upper quadrant abdominal pain, nausea and vomiting for 2 weeks. Elevated LFTs.  EXAM: ULTRASOUND ABDOMEN COMPLETE  COMPARISON:  CT 07/13/2014  FINDINGS: Gallbladder: No gallstones or wall thickening visualized. No sonographic Murphy sign noted.  Common bile duct: Diameter: 3.7 mm, normal  Liver: No focal lesion identified. Diffusely increased and heterogeneous in echogenicity consistent with steatosis. The liver parenchyma is difficult to penetrate.  IVC: No abnormality visualized.  Pancreas: Not well visualized.  Spleen: Size and appearance within normal limits.  Right Kidney: Length: 10.0 cm. Echogenicity  within normal limits. No mass or hydronephrosis visualized.  Left Kidney: Length: 10.9 cm. Echogenicity within normal limits. No mass or hydronephrosis visualized.  Abdominal aorta: No aneurysm visualized.  Other findings: None.  IMPRESSION: 1. Hepatic steatosis. 2. Otherwise unremarkable abdominal ultrasound.   Electronically Signed   By: Rubye Oaks M.D.   On: 07/16/2014 01:14   Dg Abd Acute W/chest  07/15/2014   CLINICAL DATA:  Constipation and left-sided abdominal pain for 1 month. Worse over the last 2 weeks with nausea.  EXAM: ACUTE ABDOMEN SERIES (ABDOMEN 2 VIEW & CHEST 1 VIEW)  COMPARISON:  CT 2 days prior 07/13/2014  FINDINGS: The cardiomediastinal contours are normal. The lungs are clear. There is no free intra-abdominal air. There is oral contrast throughout the colon from prior  CT, distal colon is air-filled but normal in caliber. No dilated bowel loops to suggest obstruction. No radiopaque calculi. No acute osseous abnormalities are seen.  IMPRESSION: No bowel obstruction or free air. Oral contrast throughout the colon from CT 2 days prior.   Electronically Signed   By: Rubye Oaks M.D.   On: 07/15/2014 23:38        Scheduled Meds: . LORazepam  0-4 mg Intravenous Q6H   Followed by  . [START ON 07/18/2014] LORazepam  0-4 mg Intravenous Q12H  . metoprolol succinate  25 mg Oral Daily  . mometasone-formoterol  2 puff Inhalation BID  . multivitamin with minerals  1 tablet Oral Daily  . [START ON 07/17/2014] pantoprazole  40 mg Oral Daily  . thiamine  100 mg Oral Daily   Or  . thiamine  100 mg Intravenous Daily   Continuous Infusions: . 0.9 % NaCl with KCl 20 mEq / L 125 mL/hr at 07/16/14 7425    Active Problems:   Syncope   Prolonged Q-T interval on ECG   Alcohol abuse   Elevated LFTs   Intractable abdominal pain   Hypothermia   Upper abdominal pain    Time spent: 40 minutes.    Marcellus Scott, MD, FACP, FHM. [facility] Pager [phone]  If 7PM-7AM,  please contact night-coverage [email or web address] Password [email or web address] 07/16/2014, 4:27 PM    LOS: 0 days

## 2014-07-16 NOTE — Consult Note (Signed)
CARDIOLOGY CONSULT NOTE   Patient ID: Kara [name] MRN: [id number] DOB/AGE: 30/05/1985 28 y.o.  Admit date: 3/28/201[name]6  Primary Physician    Kara [name], Alan, MD Primary Cardiologist   Dr. Charlton HawsPeter [name] Reason for Consultation   Prolonged QTc  WUJ:[id number]:Kara [name] is a 29 y.o. year old female with a  history of asthma, GERD, ETOH abuse, and environmental allergies. She was seen in office by Kara [name] on 06/11/2014 after an ED visit due to a syncopal episode in 03/2014, the EKG at that time showed NSR QT 426/503.   At her office visit she was complaining of nightly palpitations 3-4 x/week so she was placed on a 30 day event monitor. (results not in computer.) Kara [name] as well ordered an echo, stress test, and started Metoprolol XL 25 mg daily . Echo completed on 06/17/2014 with a normal EF 55-60% and grade 2 diastolic dysfunction. Last BNP drawn on 07/06/2014: 79.2  She did not go for the stress test because she began having GI symptoms and felt she needed to see a gastroenterologist about this. She has not followed up with Kara [name] since her initial appointment.       She was brought to [facility] by ambulance yesterday evening after her husband found her unconscious on the floor, she was incontinent of urine during this time. She does not remember what happened, she regained consciousness spontaneously within 1 minute. She has a history of alcohol abuse drinking 10-12 shots of whiskey per night for ~ 1 year. Over the past few weeks she has been trying to cut back on her drinking and wean herself off. She had gone ~1 week without drinking until Easter Sunday she took 2-3 shots of whiskey. She no longer smokes and denies use of illicit drugs.   EKG on admission showed sinus tachycardia with QTc 431/631 and she was hypokalemic at 3.4 She denies CP, SOB, edema, orhopnea, PND, or edema. She does feel intermittent flutters. She denies dizziness at present. She states her legs do feel weak. She reports a  unintentional weight loss of ~40 lbs since 12/2013, she states she wasn't eating well. She has been having intermittent N/V for 3-4 weeks.    Past Medical History  Diagnosis Date  . Asthma   . GERD (gastroesophageal reflux disease)   . Alcohol abuse   . Environmental allergies      Past Surgical History  Procedure Laterality Date  . Tympanostomy tube placement      Allergies  Allergen Reactions  . Peanuts [Peanut Oil] Itching    Sensitivity     I have reviewed the patient's current medications . LORazepam  0-4 mg Intravenous Q6H   Followed by  . [START ON 07/18/2014] LORazepam  0-4 mg Intravenous Q12H  . metoprolol succinate  25 mg Oral Daily  . mometasone-formoterol  2 puff Inhalation BID  . multivitamin with minerals  1 tablet Oral Daily  . [START ON 07/17/2014] pantoprazole  40 mg Oral Daily  . thiamine  100 mg Oral Daily   Or  . thiamine  100 mg Intravenous Daily   . 0.9 % NaCl with KCl 20 mEq / L 125 mL/hr at 07/16/14 [id number]   acetaminophen, albuterol, alum & mag hydroxide-simeth, LORazepam **OR** LORazepam, montelukast, morphine injection  Medication Sig  albuterol (PROVENTIL HFA;VENTOLIN HFA) 108 (90 BASE) MCG/ACT inhaler Inhale 2 puffs into the lungs every 6 (six) hours as needed for wheezing or shortness of breath (wheezing and sob).  ALPRAZolam (XANAX) 0.25 MG tablet Take 1 tablet (0.25 mg total) by mouth 2 (two) times daily as needed for anxiety.  HYDROcodone-acetaminophen (NORCO) 5-325 MG per tablet Take 1 tablet by mouth every 6 (six) hours as needed for moderate pain.  metoprolol succinate (TOPROL-XL) 25 MG 24 hr tablet Take 1 tablet (25 mg total) by mouth daily.  ranitidine (ZANTAC) 150 MG capsule Take 1 capsule (150 mg total) by mouth daily.  chlorhexidine (PERIDEX) 0.12 % solution Use as directed 10 mLs in the mouth or throat daily.  fexofenadine (ALLEGRA) 180 MG tablet Take 180 mg by mouth daily as needed for allergies.   mometasone-formoterol (DULERA)  200-5 MCG/ACT AERO Inhale 2 puffs into the lungs 2 (two) times daily.  montelukast (SINGULAIR) 10 MG tablet Take 10 mg by mouth daily as needed (allergies).   pantoprazole (PROTONIX) 20 MG tablet Take 1 tablet (20 mg total) by mouth daily. Patient not taking: Reported on 07/15/2014  traMADol (ULTRAM) 50 MG tablet Take 50 mg by mouth 4 (four) times daily as needed.     History   Social History  . Marital Status: Married    Spouse Name: Kara [name]  . Number of Children: 0  . Years of Education: N/A   Occupational History  . College advisor    Social History Main Topics  . Smoking status: Former Smoker -- 0.50 packs/day for 2 years    Types: Cigarettes    Quit date: 04/08/2012  . Smokeless tobacco: Never Used  . Alcohol Use: 0.0 oz/week    0 Standard drinks or equivalent per week     Comment: ETOH abuse, 8 shots/ day  . Drug Use: No  . Sexual Activity: Yes    Birth Control/ Protection: None   Other Topics Concern  . Not on file   Social History Narrative   Married.    Family Status  Relation Status Death Age  . Mother Alive    Family History  Problem Relation Age of Onset  . Heart failure Mother   . Heart failure Father   . Heart failure Maternal Grandfather      ROS:  Full 14 point review of systems complete and found to be negative unless listed above.  Physical Exam: Blood pressure 123/89, pulse 127, temperature 98.2 F (36.8 C), temperature source Oral, resp. rate 20, height  (1.626 m), weight 208 lb 5.4 oz (94.5 kg), last menstrual period 06/19/2014, SpO2 100 %.  General: Well developed, well nourished, female in no acute distress Head: Eyes PERRLA, No xanthomas. Normocephalic and atraumatic, oropharynx without edema or exudate. Dentition: good.  Lungs: BBB-CLA. Heart: HR tachy, S1 S2, no rub/gallop or murmur. Pulses are 2+ extrem.   Neck: No carotid bruits. No lymphadenopathy.  No JVD. Abdomen: Bowel sounds present, abdomen soft and non-tender without  masses or hernias noted. Msk:  Weakness to LE, no joint deformities or effusions. Extremities: No clubbing or cyanosis. No edema.  Neuro: Alert and oriented X 3. No focal deficits noted. Psych:  Good affect, responds appropriately. Skin: No rashes or lesions noted.  Labs:   Lab Results  Component Value Date   WBC 9.4 07/16/2014   HGB 12.0 07/16/2014   HCT 37.5 07/16/2014   MCV 96.2 07/16/2014   PLT 383 07/16/2014    Recent Labs Lab 07/16/14 0708  NA 138  K 3.7  CL 105  CO2 24  BUN <5*  CREATININE 0.54  CALCIUM 7.7*  PROT 6.0  BILITOT 0.5  ALKPHOS 114  ALT 76*  AST 300*  GLUCOSE 93  ALBUMIN 2.7*   MAGNESIUM  Date Value Ref Range Status  07/16/2014 1.4* 1.5 - 2.5 mg/dL Final    Recent Labs  16/10/96 0708  CKTOTAL 78   B NATRIURETIC PEPTIDE  Date/Time Value Ref Range Status  07/06/2014 12:55 PM 79.2 0.0 - 100.0 pg/mL Final   Lab Results  Component Value Date   DDIMER 1.13* 07/16/2014   LIPASE  Date/Time Value Ref Range Status  07/15/2014 09:24 PM 22 11 - 59 U/L Final   TSH  Date/Time Value Ref Range Status  07/16/2014 02:37 AM 1.303 0.350 - 4.500 uIU/mL Final    ECG:  Sinus tachycardia, 129. QTc 431/631.  TELE: Sinus tachycardia, 123.  Radiology:  Ct Head Wo Contrast  07/16/2014   CLINICAL DATA:  Syncope, bilateral leg weakness.  EXAM: CT HEAD WITHOUT CONTRAST  TECHNIQUE: Contiguous axial images were obtained from the base of the skull through the vertex without intravenous contrast.  COMPARISON:  None.  FINDINGS: Ventricles and sulci are appropriate for patient's age. No evidence for acute cortically based infarct intracranial hemorrhage, mass lesion or mass effect. Paranasal sinuses are unremarkable. Calvarium is intact. Orbits are unremarkable.  IMPRESSION: No acute intracranial process.   Electronically Signed   By: Annia Belt M.D.   On: 07/16/2014 12:43   US Abdomen Complete 07/16/2014   CLINICAL DATA:  Right upper quadrant abdominal pain,  nausea and vomiting for 2 weeks. Elevated LFTs.  EXAM: ULTRASOUND ABDOMEN COMPLETE  COMPARISON:  CT 07/13/2014  FINDINGS: Gallbladder: No gallstones or wall thickening visualized. No sonographic Murphy sign noted.  Common bile duct: Diameter: 3.7 mm, normal  Liver: No focal lesion identified. Diffusely increased and heterogeneous in echogenicity consistent with steatosis. The liver parenchyma is difficult to penetrate.  IVC: No abnormality visualized.  Pancreas: Not well visualized.  Spleen: Size and appearance within normal limits.  Right Kidney: Length: 10.0 cm. Echogenicity within normal limits. No mass or hydronephrosis visualized.  Left Kidney: Length: 10.9 cm. Echogenicity within normal limits. No mass or hydronephrosis visualized.  Abdominal aorta: No aneurysm visualized.  Other findings: None.  IMPRESSION: 1. Hepatic steatosis. 2. Otherwise unremarkable abdominal ultrasound.   Electronically Signed   By: Rubye Oaks M.D.   On: 07/16/2014 01:14   Dg Abd Acute W/chest 07/15/2014   CLINICAL DATA:  Constipation and left-sided abdominal pain for 1 month. Worse over the last 2 weeks with nausea.  EXAM: ACUTE ABDOMEN SERIES (ABDOMEN 2 VIEW & CHEST 1 VIEW)  COMPARISON:  CT 2 days prior 07/13/2014  FINDINGS: The cardiomediastinal contours are normal. The lungs are clear. There is no free intra-abdominal air. There is oral contrast throughout the colon from prior CT, distal colon is air-filled but normal in caliber. No dilated bowel loops to suggest obstruction. No radiopaque calculi. No acute osseous abnormalities are seen.  IMPRESSION: No bowel obstruction or free air. Oral contrast throughout the colon from CT 2 days prior.   Electronically Signed   By: Rubye Oaks M.D.   On: 07/15/2014 23:38    ASSESSMENT AND PLAN:   The patient was seen today by Dr [place], the patient evaluated and the data reviewed.   Active Problems:   Syncope -Denies dizziness, lightheadedness, or further syncopal episodes.    -Hypokalemic on admission, 3.7 today after receiving K-dur 20 meq. -Magnesium 1.4 this a.m., replaced with 4 gm IV Mg today. Due to be rechecked tomorrow. -CT head showed no acute intracranial process -EEG with  no signs of epileptiform activity - per IM.    Sinus tachycardia with prolonged Q-T interval on ECG -Denies CP or SOB, does have intermittent flutters. -EKG: Sinus tachycardia, 129. QTc 431/631. -TSH: normal -D. Dimer: 1.13, no tachypnea or hypoxia so unlikely she has PE, will defer to IM the need for further testing. -Calcium 7.7, unclear significance in the setting of low albumin - Magnesium 1.4 this a.m., replaced with 4 gm IV Mg today. Due to be rechecked tomorrow. - EF normal by echo recently - recheck ECG once electrolytes improved     Elevated LFTs -per IM/GI   Alcohol abuse -Advised about quitting -per IM     Abdominal pain -per GI     Hypothermia -temp currently WNL -per IM   Signed: Theodore Demark, PA-C 07/16/2014 5:31 PM Beeper [phone]  Co-Sign MD Patient seen and examined and history reviewed. Agree with above findings and plan. Patient is a 29 yo WF with history of Etoh abuse admitted after being found on floor passed out by husband. He reports he called her 30 minutes before and she was speaking incoherently. Unclear how long she was down. She was evaluated by Dr. Eden Emms in February following a syncopal episode. Echo unremarkable. Event monitor never turned in. She states she recorded a few episodes of palpitations but unable to access in Epic. On admission she was hypothermic. She has hypokalemia and hypomagnesemia. Screen positive for Etoh. Elevated LFTs and low albumin consistent with hepatic disease.  Her history is not consistent with cardiac syncope and is most likely related to Etoh abuse ? Seizures. Her prolonged QT exacerbated by hypothermia and multiple electrolyte abnormalities.  At this point would correct metabolic abnormalities and repeat  an Ecg once this is done. Will try and find out what her prior event monitor shows. No arrhythmias on monitor here other than sinus tachy.  Krisi Azua [place], MDFACC 07/16/2014 5:41 PM

## 2014-07-16 NOTE — Progress Notes (Signed)
Offsite EEG completed, results pending. 

## 2014-07-16 NOTE — H&P (Addendum)
[facility] History and Physical  Kara [name] MVH:[id number] DOB: 03/10/1986 DOA: 07/15/2014  Referring physician: ER physician. PCP:  Duane Lopeoss, Alan, MD   Chief Complaint: Loss of consciousness and abdominal pain.  History obtained from ER physician and patient and patient's husband.  HPI: Kara Lewmanda Scali is a 29 y.o. female history of alcohol abuse who was admitted in February 2016 for syncope and was found to have long QT and was eventually referred to the cardiologist and had 2-D echo done which showed EF of 55-60% with grade 2 diastolic dysfunction was brought to the ER after patient has been found patient on the floor unconscious. Patient has been states that he spoke to the patient at around 5 PM over the phone and when he reached home at around 5:30 PM patient was on the floor unconscious. Patient regained consciousness spontaneously within 1 minute and did not remember what happened. Patient had incontinence of urine also. Patient drinks alcohol everyday and has been trying to cut short her alcohol drinking over the last few weeks. Patient also has been having chronic abdominal pain and recent CAT scan abdomen done 2 weeks ago was unremarkable. Patient has follow-up with gastroenterologist and custard or this is planning procedures next month as per the patient. Patient denies any chest pain palpitations or shortness of breath. Denies any headache visual symptoms. Patient's pain is mostly in the epigastric area. Labs revealed elevated LFTs. Sonogram of abdomen is unremarkable. Patient was tachycardic and lactic acid was elevated. Patient initially was hypothermic and was placed on warming blankets and temperature improved. Patient is not having any signs or symptoms of infectious process.  Review of Systems: As presented in the history of presenting illness, rest negative.  Past Medical History  Diagnosis Date  . Asthma   . GERD (gastroesophageal reflux disease)   . Alcohol abuse   .  Environmental allergies    Past Surgical History  Procedure Laterality Date  . Tympanostomy tube placement     Social History:  reports that she quit smoking about 2 years ago. Her smoking use included Cigarettes. She has a 1 pack-year smoking history. She has never used smokeless tobacco. She reports that she drinks alcohol. She reports that she does not use illicit drugs. Where does patient live home. Can patient participate in ADLs? Yes.  Allergies  Allergen Reactions  . Peanuts [Peanut Oil] Itching    Sensitivity     Family History:  Family History  Problem Relation Age of Onset  . Heart failure Mother   . Heart failure Father   . Heart failure Maternal Grandfather       Prior to Admission medications   Medication Sig Start Date End Date Taking? Authorizing Provider  albuterol (PROVENTIL HFA;VENTOLIN HFA) 108 (90 BASE) MCG/ACT inhaler Inhale 2 puffs into the lungs every 6 (six) hours as needed for wheezing or shortness of breath (wheezing and sob).    Yes Historical Provider, MD  ALPRAZolam (XANAX) 0.25 MG tablet Take 1 tablet (0.25 mg total) by mouth 2 (two) times daily as needed for anxiety. 06/11/14  Yes Wendall StadePeter C Nishan, MD  HYDROcodone-acetaminophen (NORCO) 5-325 MG per tablet Take 1 tablet by mouth every 6 (six) hours as needed for moderate pain. 07/13/14  Yes Tatyana Kirichenko, PA-C  metoprolol succinate (TOPROL-XL) 25 MG 24 hr tablet Take 1 tablet (25 mg total) by mouth daily. 06/17/14  Yes Wendall StadePeter C Nishan, MD  ranitidine (ZANTAC) 150 MG capsule Take 1 capsule (150 mg total) by mouth daily.  07/13/14  Yes Tatyana Kirichenko, PA-C  chlorhexidine (PERIDEX) 0.12 % solution Use as directed 10 mLs in the mouth or throat daily.    Historical Provider, MD  fexofenadine (ALLEGRA) 180 MG tablet Take 180 mg by mouth daily as needed for allergies.     Historical Provider, MD  mometasone-formoterol (DULERA) 200-5 MCG/ACT AERO Inhale 2 puffs into the lungs 2 (two) times daily.    Historical  Provider, MD  montelukast (SINGULAIR) 10 MG tablet Take 10 mg by mouth daily as needed (allergies).     Historical Provider, MD  pantoprazole (PROTONIX) 20 MG tablet Take 1 tablet (20 mg total) by mouth daily. Patient not taking: Reported on 07/15/2014 07/13/14   Jaynie Crumble, PA-C  traMADol (ULTRAM) 50 MG tablet Take 50 mg by mouth 4 (four) times daily as needed. 07/08/14   Historical Provider, MD    Physical Exam: Filed Vitals:   07/15/14 2330 07/16/14 0059 07/16/14 0245 07/16/14 0321  BP:  112/87 102/58 119/81  Pulse:  124 130 129  Temp: 96.7 F (35.9 C) 98.2 F (36.8 C) 98.1 F (36.7 C) 98 F (36.7 C)  TempSrc: Rectal Oral Oral Oral  Resp:  Weight:    94.5 kg (208 lb 5.4 oz)  SpO2:  98% 95% 99%     General:  Well-developed and nourished.  Eyes: Anicteric no pallor.  ENT: No discharge from the ears eyes nose or mouth.  Neck: No mass felt.  Cardiovascular: S1-S2 heard.  Respiratory: No rhonchi or crepitations.  Abdomen: Soft nontender bowel sounds present.  Skin: No rash.  Musculoskeletal: No edema.  Psychiatric: Appears normal.  Neurologic: Alert awake oriented to time place and person. Moves all extremities.  Labs on Admission:  Basic Metabolic Panel:  Recent Labs Lab 07/12/14 1941 07/15/14 2124  NA 136 144  K 3.6 3.4*  CL 98 107  CO2 28 22  GLUCOSE 96 117*  BUN <5* <5*  CREATININE 0.70 0.55  CALCIUM 8.8 8.2*   Liver Function Tests:  Recent Labs Lab 07/12/14 1941 07/15/14 2124  AST 140* 325*  ALT 33 82*  ALKPHOS 118* 128*  BILITOT 0.6 0.6  PROT 6.9 6.7  ALBUMIN 3.1* 3.0*    Recent Labs Lab 07/12/14 1941 07/15/14 2124  LIPASE 26 22   No results for input(s): AMMONIA in the last 168 hours. CBC:  Recent Labs Lab 07/12/14 1941 07/15/14 2124  WBC 7.4 7.3  NEUTROABS 4.4 4.7  HGB 13.9 13.3  HCT 41.9 41.4  MCV 94.2 96.1  PLT 370 406*   Cardiac Enzymes: No results for input(s): CKTOTAL, CKMB, CKMBINDEX, TROPONINI  in the last 168 hours.  BNP (last 3 results)  Recent Labs  07/06/14 1255  BNP 79.2    ProBNP (last 3 results) No results for input(s): PROBNP in the last 8760 hours.  CBG: No results for input(s): GLUCAP in the last 168 hours.  Radiological Exams on Admission: US Abdomen Complete  07/16/2014   CLINICAL DATA:  Right upper quadrant abdominal pain, nausea and vomiting for 2 weeks. Elevated LFTs.  EXAM: ULTRASOUND ABDOMEN COMPLETE  COMPARISON:  CT 07/13/2014  FINDINGS: Gallbladder: No gallstones or wall thickening visualized. No sonographic Murphy sign noted.  Common bile duct: Diameter: 3.7 mm, normal  Liver: No focal lesion identified. Diffusely increased and heterogeneous in echogenicity consistent with steatosis. The liver parenchyma is difficult to penetrate.  IVC: No abnormality visualized.  Pancreas: Not well visualized.  Spleen: Size and appearance within normal limits.  Right Kidney: Length: 10.0 cm. Echogenicity within normal limits. No mass or hydronephrosis visualized.  Left Kidney: Length: 10.9 cm. Echogenicity within normal limits. No mass or hydronephrosis visualized.  Abdominal aorta: No aneurysm visualized.  Other findings: None.  IMPRESSION: 1. Hepatic steatosis. 2. Otherwise unremarkable abdominal ultrasound.   Electronically Signed   By: Rubye Oaks M.D.   On: 07/16/2014 01:14   Dg Abd Acute W/chest  07/15/2014   CLINICAL DATA:  Constipation and left-sided abdominal pain for 1 month. Worse over the last 2 weeks with nausea.  EXAM: ACUTE ABDOMEN SERIES (ABDOMEN 2 VIEW & CHEST 1 VIEW)  COMPARISON:  CT 2 days prior 07/13/2014  FINDINGS: The cardiomediastinal contours are normal. The lungs are clear. There is no free intra-abdominal air. There is oral contrast throughout the colon from prior CT, distal colon is air-filled but normal in caliber. No dilated bowel loops to suggest obstruction. No radiopaque calculi. No acute osseous abnormalities are seen.  IMPRESSION: No bowel  obstruction or free air. Oral contrast throughout the colon from CT 2 days prior.   Electronically Signed   By: Rubye Oaks M.D.   On: 07/15/2014 23:38    EKG: Independently reviewed. Sinus tachycardia. With long QTC of 631 ms.  Assessment/Plan Active Problems:   Syncope   Prolonged Q-T interval on ECG   Alcohol abuse   Elevated LFTs   Intractable abdominal pain   Hypothermia   Upper abdominal pain   1. Syncope - not sure if it was possible seizures given that patient was trying to decrease her alcohol intake and since patient also had some incontinence of urine during the episode. Since patient also had history of long QT not sure patient had arrhythmias. We will closely monitor in telemetry get EEG and CT head. Closely monitor electrolytes. Patient is mildly hypokalemic which will be replaced and check magnesium levels. Patient does follow with cardiology may get opinion from them in a.m. 2. Elevated LFTs - may be related to alcohol. Sonogram was unremarkable. Closely follow LFTs and check acute hepatitis panel. I have also added Tylenol levels. 3. Long QT - patient is on metoprolol. See #1. 4. Sinus tachycardia - may be related to alcohol withdrawal. Check thyroid function test and d-dimer. Patient is on metoprolol. 5. Alcohol abuse - patient advised about quitting habit. Patient has been placed on alcohol withdrawal protocol. 6. Hypothermia - initially on presentation patient was hypothermic. Patient does not look septic but lactic acid. At this time we will hydrate and recheck lactic acid levels. Check TSH cortisol blood cultures to rule out other causes for hypothermia. 7. Chronic abdominal pain - I have placed patient return next for now. Patient has GI follow-up. Follow LFTs.  CT head and CK levels are pending.  Since patient's 2D ECHO showed diastolic dysfunction carefully observe for fluid overload.   DVT Prophylaxis SCDs for now until CT head is done.  Code Status: Full  code.  Family Communication: Patient's husband.  Disposition Plan: Admit to inpatient.    Ifeanyichukwu Wickham N. [facility] Pager [phone].  If 7PM-7AM, please contact night-coverage [email or web address] Password [email or web address] 07/16/2014, 4:12 AM

## 2014-07-16 NOTE — Clinical Documentation Improvement (Signed)
Supporting Information:  Labs: Lactic acid: 3/29: 3.85. 3/29: 3.4.   3/28:  Blood pressure 93/56 per doc flow sheets.  3/29:  Heart rate up to 130 per doc flow sheets.  Hypothermia-temperature 93.1 per 3/28 progress notes.     Possible Clinical Condition: . Bacteremia (positive blood cultures only) . Sepsis with UTI, versus UTI only . Sepsis-specify causative organism if known . Sepsis due to: --Device --Implant --Graft --Infusion --Abortion . Severe sepsis-sepsis with organ dysfunction --Specify organ dysfunction Respiratory failure Encephalopathy Acute kidney failure Other (specify) . SIRS (Systemic Inflammatory Response Syndrome --With or without organ dysfunction . Document septic shock if present . Document any associated diagnoses/conditions     Thank Gabriel CirriYou,  Kimmie Doren Mathews-Bethea,RN,BSN,Clinical Documentation Specialist [phone] [email or web address]

## 2014-07-16 NOTE — Consult Note (Signed)
[facility] Consult Note  Referring Provider: No ref. provider found Primary Care Physician:   Melinda Crutch, MD Primary Gastroenterologist:  Dr.  Laurel Dimmer Complaint: Epigastric abdominal pain HPI: Kara [name] is an 29 y.o. white female  whom I saw in the office initially for two-week history of epigastric abdominal pain that she says started after she quit drinking daily which consisted of shots of whiskey. She was seen prior to her visit with me and had had an abdominal ultrasound which was normal. I started her on double dose omeprazole and she was given tramadol prior to her visit with me. She had more abdominal pain and came to the emergency room and had a CT scan done which was completely unremarkable. He apparently had an episode of syncope today her abdominal pain is not getting any better. She was incontinent of urine. She has an elevated AST of 300 with an ALP of 76. WBC and hemoglobin are normal.  Past Medical History  Diagnosis Date  . Asthma   . GERD (gastroesophageal reflux disease)   . Alcohol abuse   . Environmental allergies     Past Surgical History  Procedure Laterality Date  . Tympanostomy tube placement      Medications Prior to Admission  Medication Sig Dispense Refill  . albuterol (PROVENTIL HFA;VENTOLIN HFA) 108 (90 BASE) MCG/ACT inhaler Inhale 2 puffs into the lungs every 6 (six) hours as needed for wheezing or shortness of breath (wheezing and sob).     . ALPRAZolam (XANAX) 0.25 MG tablet Take 1 tablet (0.25 mg total) by mouth 2 (two) times daily as needed for anxiety. 60 tablet 3  . HYDROcodone-acetaminophen (NORCO) 5-325 MG per tablet Take 1 tablet by mouth every 6 (six) hours as needed for moderate pain. 20 tablet 0  . metoprolol succinate (TOPROL-XL) 25 MG 24 hr tablet Take 1 tablet (25 mg total) by mouth daily. 30 tablet 11  . ranitidine (ZANTAC) 150 MG capsule Take 1 capsule (150 mg total) by mouth daily. 30 capsule 0  . chlorhexidine (PERIDEX)  0.12 % solution Use as directed 10 mLs in the mouth or throat daily.    . fexofenadine (ALLEGRA) 180 MG tablet Take 180 mg by mouth daily as needed for allergies.     . mometasone-formoterol (DULERA) 200-5 MCG/ACT AERO Inhale 2 puffs into the lungs 2 (two) times daily.    . montelukast (SINGULAIR) 10 MG tablet Take 10 mg by mouth daily as needed (allergies).     . pantoprazole (PROTONIX) 20 MG tablet Take 1 tablet (20 mg total) by mouth daily. (Patient not taking: Reported on 07/15/2014) 30 tablet 1  . traMADol (ULTRAM) 50 MG tablet Take 50 mg by mouth 4 (four) times daily as needed.  0    Allergies:  Allergies  Allergen Reactions  . Peanuts [Peanut Oil] Itching    Sensitivity     Family History  Problem Relation Age of Onset  . Heart failure Mother   . Heart failure Father   . Heart failure Maternal Grandfather     Social History:  reports that she quit smoking about 2 years ago. Her smoking use included Cigarettes. She has a 1 pack-year smoking history. She has never used smokeless tobacco. She reports that she drinks alcohol. She reports that she does not use illicit drugs.  Review of Systems: negative except no bowel movement in the last for 5 days she says secondary to not eating  Blood pressure 119/81, pulse 129, temperature 98 F (36.7  C), temperature source Oral, resp. rate 18, weight 94.5 kg (208 lb 5.4 oz), last menstrual period 06/19/2014, SpO2 99 %. Head: Normocephalic, without obvious abnormality, atraumatic Neck: no adenopathy, no carotid bruit, no JVD, supple, symmetrical, trachea midline and thyroid not enlarged, symmetric, no tenderness/mass/nodules Resp: clear to auscultation bilaterally Cardio: regular rate and rhythm, S1, S2 normal, no murmur, click, rub or gallop GI: Abdomen soft slightly distended diffusely tender mainly in the upper abdomen no focal rebound  Extremities: extremities normal, atraumatic, no cyanosis or edema  Results for orders placed or  performed during the hospital encounter of 07/15/14 (from the past 48 hour(s))  Urinalysis, Routine w reflex microscopic     Status: Abnormal   Collection Time: 07/15/14  8:35 PM  Result Value Ref Range   Color, Urine YELLOW YELLOW   APPearance CLEAR CLEAR   Specific Gravity, Urine 1.006 1.005 - 1.030   pH 6.5 5.0 - 8.0   Glucose, UA NEGATIVE NEGATIVE mg/dL   Hgb urine dipstick SMALL (A) NEGATIVE   Bilirubin Urine NEGATIVE NEGATIVE   Ketones, ur NEGATIVE NEGATIVE mg/dL   Protein, ur NEGATIVE NEGATIVE mg/dL   Urobilinogen, UA 0.2 0.0 - 1.0 mg/dL   Nitrite NEGATIVE NEGATIVE   Leukocytes, UA NEGATIVE NEGATIVE  Pregnancy, urine     Status: None   Collection Time: 07/15/14  8:35 PM  Result Value Ref Range   Preg Test, Ur NEGATIVE NEGATIVE    Comment:        THE SENSITIVITY OF THIS METHODOLOGY IS >20 mIU/mL.   Urine microscopic-add on     Status: None   Collection Time: 07/15/14  8:35 PM  Result Value Ref Range   WBC, UA 0-2 <3 WBC/hpf   RBC / HPF 0-2 <3 RBC/hpf  Urine rapid drug screen (hosp performed)     Status: Abnormal   Collection Time: 07/15/14  8:35 PM  Result Value Ref Range   Opiates POSITIVE (A) NONE DETECTED   Cocaine NONE DETECTED NONE DETECTED   Benzodiazepines NONE DETECTED NONE DETECTED   Amphetamines NONE DETECTED NONE DETECTED   Tetrahydrocannabinol NONE DETECTED NONE DETECTED   Barbiturates NONE DETECTED NONE DETECTED    Comment:        DRUG SCREEN FOR MEDICAL PURPOSES ONLY.  IF CONFIRMATION IS NEEDED FOR ANY PURPOSE, NOTIFY LAB WITHIN 5 DAYS.        LOWEST DETECTABLE LIMITS FOR URINE DRUG SCREEN Drug Class       Cutoff (ng/mL) Amphetamine      1000 Barbiturate      200 Benzodiazepine   450 Tricyclics       388 Opiates          300 Cocaine          300 THC              50   CBC with Differential     Status: Abnormal   Collection Time: 07/15/14  9:24 PM  Result Value Ref Range   WBC 7.3 4.0 - 10.5 K/uL   RBC 4.31 3.87 - 5.11 MIL/uL   Hemoglobin  13.3 12.0 - 15.0 g/dL   HCT 41.4 36.0 - 46.0 %   MCV 96.1 78.0 - 100.0 fL   MCH 30.9 26.0 - 34.0 pg   MCHC 32.1 30.0 - 36.0 g/dL   RDW 15.4 11.5 - 15.5 %   Platelets 406 (H) 150 - 400 K/uL   Neutrophils Relative % 65 43 - 77 %   Neutro Abs 4.7  1.7 - 7.7 K/uL   Lymphocytes Relative 29 12 - 46 %   Lymphs Abs 2.1 0.7 - 4.0 K/uL   Monocytes Relative 5 3 - 12 %   Monocytes Absolute 0.4 0.1 - 1.0 K/uL   Eosinophils Relative 0 0 - 5 %   Eosinophils Absolute 0.0 0.0 - 0.7 K/uL   Basophils Relative 1 0 - 1 %   Basophils Absolute 0.1 0.0 - 0.1 K/uL  Comprehensive metabolic panel     Status: Abnormal   Collection Time: 07/15/14  9:24 PM  Result Value Ref Range   Sodium 144 135 - 145 mmol/L   Potassium 3.4 (L) 3.5 - 5.1 mmol/L   Chloride 107 96 - 112 mmol/L   CO2 22 19 - 32 mmol/L   Glucose, Bld 117 (H) 70 - 99 mg/dL   BUN <5 (L) 6 - 23 mg/dL   Creatinine, Ser 0.55 0.50 - 1.10 mg/dL   Calcium 8.2 (L) 8.4 - 10.5 mg/dL   Total Protein 6.7 6.0 - 8.3 g/dL   Albumin 3.0 (L) 3.5 - 5.2 g/dL   AST 325 (H) 0 - 37 U/L   ALT 82 (H) 0 - 35 U/L   Alkaline Phosphatase 128 (H) 39 - 117 U/L   Total Bilirubin 0.6 0.3 - 1.2 mg/dL   GFR calc non Af Amer >90 >90 mL/min   GFR calc Af Amer >90 >90 mL/min    Comment: (NOTE) The eGFR has been calculated using the CKD EPI equation. This calculation has not been validated in all clinical situations. eGFR's persistently <90 mL/min signify possible Chronic Kidney Disease.    Anion gap 15 5 - 15  Lipase, blood     Status: None   Collection Time: 07/15/14  9:24 PM  Result Value Ref Range   Lipase 22 11 - 59 U/L  Cortisol     Status: None   Collection Time: 07/16/14  2:37 AM  Result Value Ref Range   Cortisol, Plasma 10.1 ug/dL    Comment: (NOTE) AM:  4.3 - 22.4 ug/dL PM:  3.1 - 16.7 ug/dL Performed at [facility]   TSH     Status: None   Collection Time: 07/16/14  2:37 AM  Result Value Ref Range   TSH 1.303 0.350 - 4.500 uIU/mL  I-Stat CG4  Lactic Acid, ED     Status: Abnormal   Collection Time: 07/16/14  2:54 AM  Result Value Ref Range   Lactic Acid, Venous 3.85 (HH) 0.5 - 2.0 mmol/L   Comment NOTIFIED PHYSICIAN   Hepatic function panel     Status: Abnormal   Collection Time: 07/16/14  7:08 AM  Result Value Ref Range   Total Protein 6.0 6.0 - 8.3 g/dL   Albumin 2.7 (L) 3.5 - 5.2 g/dL   AST 300 (H) 0 - 37 U/L   ALT 76 (H) 0 - 35 U/L   Alkaline Phosphatase 114 39 - 117 U/L   Total Bilirubin 0.5 0.3 - 1.2 mg/dL   Bilirubin, Direct 0.2 0.0 - 0.5 mg/dL   Indirect Bilirubin 0.3 0.3 - 0.9 mg/dL  Lactic acid, plasma     Status: Abnormal   Collection Time: 07/16/14  7:08 AM  Result Value Ref Range   Lactic Acid, Venous 3.4 (HH) 0.5 - 2.0 mmol/L    Comment: REPEATED TO VERIFY CRITICAL RESULT CALLED TO, READ BACK BY AND VERIFIED WITH: L GOWER RN AT 1062 ON 03.29.16 BY G KONTOS   Basic metabolic panel  Status: Abnormal   Collection Time: 07/16/14  7:08 AM  Result Value Ref Range   Sodium 138 135 - 145 mmol/L   Potassium 3.7 3.5 - 5.1 mmol/L   Chloride 105 96 - 112 mmol/L   CO2 24 19 - 32 mmol/L   Glucose, Bld 93 70 - 99 mg/dL   BUN <5 (L) 6 - 23 mg/dL    Comment: REPEATED TO VERIFY   Creatinine, Ser 0.54 0.50 - 1.10 mg/dL   Calcium 7.7 (L) 8.4 - 10.5 mg/dL   GFR calc non Af Amer >90 >90 mL/min   GFR calc Af Amer >90 >90 mL/min    Comment: (NOTE) The eGFR has been calculated using the CKD EPI equation. This calculation has not been validated in all clinical situations. eGFR's persistently <90 mL/min signify possible Chronic Kidney Disease.    Anion gap 9 5 - 15  Magnesium     Status: Abnormal   Collection Time: 07/16/14  7:08 AM  Result Value Ref Range   Magnesium 1.4 (L) 1.5 - 2.5 mg/dL  CBC     Status: Abnormal   Collection Time: 07/16/14  7:08 AM  Result Value Ref Range   WBC 9.4 4.0 - 10.5 K/uL   RBC 3.90 3.87 - 5.11 MIL/uL   Hemoglobin 12.0 12.0 - 15.0 g/dL   HCT 37.5 36.0 - 46.0 %   MCV 96.2 78.0 -  100.0 fL   MCH 30.8 26.0 - 34.0 pg   MCHC 32.0 30.0 - 36.0 g/dL   RDW 15.8 (H) 11.5 - 15.5 %   Platelets 383 150 - 400 K/uL  CK     Status: None   Collection Time: 07/16/14  7:08 AM  Result Value Ref Range   Total CK 78 7 - 177 U/L  D-dimer, quantitative     Status: Abnormal   Collection Time: 07/16/14  7:08 AM  Result Value Ref Range   D-Dimer, Quant 1.13 (H) 0.00 - 0.48 ug/mL-FEU    Comment:        AT THE INHOUSE ESTABLISHED CUTOFF VALUE OF 0.48 ug/mL FEU, THIS ASSAY HAS BEEN DOCUMENTED IN THE LITERATURE TO HAVE A SENSITIVITY AND NEGATIVE PREDICTIVE VALUE OF AT LEAST 98 TO 99%.  THE TEST RESULT SHOULD BE CORRELATED WITH AN ASSESSMENT OF THE CLINICAL PROBABILITY OF DVT / VTE.   Ethanol     Status: Abnormal   Collection Time: 07/16/14  8:50 AM  Result Value Ref Range   Alcohol, Ethyl (B) 59 (H) 0 - 9 mg/dL    Comment:        LOWEST DETECTABLE LIMIT FOR SERUM ALCOHOL IS 11 mg/dL FOR MEDICAL PURPOSES ONLY    US Abdomen Complete  07/16/2014   CLINICAL DATA:  Right upper quadrant abdominal pain, nausea and vomiting for 2 weeks. Elevated LFTs.  EXAM: ULTRASOUND ABDOMEN COMPLETE  COMPARISON:  CT 07/13/2014  FINDINGS: Gallbladder: No gallstones or wall thickening visualized. No sonographic Murphy sign noted.  Common bile duct: Diameter: 3.7 mm, normal  Liver: No focal lesion identified. Diffusely increased and heterogeneous in echogenicity consistent with steatosis. The liver parenchyma is difficult to penetrate.  IVC: No abnormality visualized.  Pancreas: Not well visualized.  Spleen: Size and appearance within normal limits.  Right Kidney: Length: 10.0 cm. Echogenicity within normal limits. No mass or hydronephrosis visualized.  Left Kidney: Length: 10.9 cm. Echogenicity within normal limits. No mass or hydronephrosis visualized.  Abdominal aorta: No aneurysm visualized.  Other findings: None.  IMPRESSION: 1. Hepatic steatosis.  2. Otherwise unremarkable abdominal ultrasound.    Electronically Signed   By: Jeb Levering M.D.   On: 07/16/2014 01:14   Dg Abd Acute W/chest  07/15/2014   CLINICAL DATA:  Constipation and left-sided abdominal pain for 1 month. Worse over the last 2 weeks with nausea.  EXAM: ACUTE ABDOMEN SERIES (ABDOMEN 2 VIEW & CHEST 1 VIEW)  COMPARISON:  CT 2 days prior 07/13/2014  FINDINGS: The cardiomediastinal contours are normal. The lungs are clear. There is no free intra-abdominal air. There is oral contrast throughout the colon from prior CT, distal colon is air-filled but normal in caliber. No dilated bowel loops to suggest obstruction. No radiopaque calculi. No acute osseous abnormalities are seen.  IMPRESSION: No bowel obstruction or free air. Oral contrast throughout the colon from CT 2 days prior.   Electronically Signed   By: Jeb Levering M.D.   On: 07/15/2014 23:38    Assessment: Persistent epigastric abdominal pain associated with alcohol abuse, abdominal ultrasound and CT scan unrevealing except for fatty liver  Elevated liver function test with AST much higher than ALT consistent with alcoholic hepatitis  Plan:  IV proton pump inhibitor and symptomatic pain and nausea management. Will proceed with EGD in the morning as had been scheduled as an outpatient.  Antwion Carpenter C 07/16/2014, 11:00 AM

## 2014-07-17 ENCOUNTER — Inpatient Hospital Stay (HOSPITAL_COMMUNITY): Payer: BC Managed Care – PPO | Admitting: Anesthesiology

## 2014-07-17 ENCOUNTER — Encounter (HOSPITAL_COMMUNITY)
Admission: EM | Disposition: A | Discharge status: Home / Self Care | Payer: Self-pay | Source: Home / Self Care | Attending: Internal Medicine

## 2014-07-17 ENCOUNTER — Encounter (HOSPITAL_COMMUNITY): Payer: Self-pay | Admitting: Certified Registered Nurse Anesthetist

## 2014-07-17 ENCOUNTER — Other Ambulatory Visit: Payer: Self-pay

## 2014-07-17 DIAGNOSIS — M79609 Pain in unspecified limb: Secondary | ICD-10-CM

## 2014-07-17 DIAGNOSIS — K5900 Constipation, unspecified: Secondary | ICD-10-CM | POA: Insufficient documentation

## 2014-07-17 DIAGNOSIS — K5901 Slow transit constipation: Secondary | ICD-10-CM

## 2014-07-17 DIAGNOSIS — R7989 Other specified abnormal findings of blood chemistry: Secondary | ICD-10-CM

## 2014-07-17 HISTORY — PX: ESOPHAGOGASTRODUODENOSCOPY: SHX5428

## 2014-07-17 LAB — COMPREHENSIVE METABOLIC PANEL
ALBUMIN: 2.6 g/dL — AB (ref 3.5–5.2)
ALT: 58 U/L — ABNORMAL HIGH (ref 0–35)
ANION GAP: 8 (ref 5–15)
AST: 154 U/L — ABNORMAL HIGH (ref 0–37)
Alkaline Phosphatase: 114 U/L (ref 39–117)
CHLORIDE: 105 mmol/L (ref 96–112)
CO2: 24 mmol/L (ref 19–32)
Calcium: 7.9 mg/dL — ABNORMAL LOW (ref 8.4–10.5)
Creatinine, Ser: 0.56 mg/dL (ref 0.50–1.10)
GFR calc Af Amer: >90 mL/min (ref >90)
GFR calc non Af Amer: >90 mL/min (ref >90)
GLUCOSE: 87 mg/dL (ref 70–99)
Potassium: 4.1 mmol/L (ref 3.5–5.1)
Sodium: 137 mmol/L (ref 135–145)
Total Bilirubin: 0.8 mg/dL (ref 0.3–1.2)
Total Protein: 5.7 g/dL — ABNORMAL LOW (ref 6.0–8.3)

## 2014-07-17 LAB — MAGNESIUM: MAGNESIUM: 1.9 mg/dL (ref 1.5–2.5)

## 2014-07-17 LAB — T3, FREE: T3, Free: 2.1 pg/mL (ref 2.0–4.4)

## 2014-07-17 SURGERY — EGD (ESOPHAGOGASTRODUODENOSCOPY)
Anesthesia: Monitor Anesthesia Care

## 2014-07-17 MED ORDER — SENNOSIDES-DOCUSATE SODIUM 8.6-50 MG PO TABS
1.0000 | ORAL_TABLET | Freq: Two times a day (BID) | ORAL | Status: DC
  Administered 2014-07-17 – 2014-07-19 (×5): 1 via ORAL
  Filled 2014-07-17 (×7): qty 1

## 2014-07-17 MED ORDER — PROPOFOL 10 MG/ML IV BOLUS
INTRAVENOUS | Status: DC | PRN
  Administered 2014-07-17: 20 mg via INTRAVENOUS
  Administered 2014-07-17: 40 mg via INTRAVENOUS
  Administered 2014-07-17 (×2): 20 mg via INTRAVENOUS

## 2014-07-17 MED ORDER — PROPOFOL 10 MG/ML IV BOLUS
INTRAVENOUS | Status: AC
  Filled 2014-07-17: qty 20

## 2014-07-17 MED ORDER — SUCRALFATE 1 GM/10ML PO SUSP
1.0000 g | Freq: Three times a day (TID) | ORAL | Status: DC
  Administered 2014-07-17 – 2014-07-19 (×7): 1 g via ORAL
  Filled 2014-07-17 (×11): qty 10

## 2014-07-17 MED ORDER — LACTATED RINGERS IV SOLN
INTRAVENOUS | Status: DC | PRN
  Administered 2014-07-17: 10:00:00 via INTRAVENOUS

## 2014-07-17 MED ORDER — SODIUM CHLORIDE 0.9 % IV SOLN: INTRAVENOUS | Status: DC

## 2014-07-17 MED ORDER — PROPOFOL 10 MG/ML IV BOLUS: INTRAVENOUS | Status: DC | PRN

## 2014-07-17 MED ORDER — BUTAMBEN-TETRACAINE-BENZOCAINE 2-2-14 % EX AERO
INHALATION_SPRAY | CUTANEOUS | Status: DC | PRN
  Administered 2014-07-17: 2 via TOPICAL

## 2014-07-17 MED ORDER — POLYETHYLENE GLYCOL 3350 17 G PO PACK
17.0000 g | PACK | Freq: Every day | ORAL | Status: DC
  Administered 2014-07-17 – 2014-07-18 (×2): 17 g via ORAL
  Filled 2014-07-17 (×2): qty 1

## 2014-07-17 MED ORDER — PROMETHAZINE HCL 25 MG/ML IJ SOLN: 6.2500 mg | INTRAMUSCULAR | Status: DC | PRN

## 2014-07-17 MED ORDER — LACTATED RINGERS IV SOLN
INTRAVENOUS | Status: DC
  Administered 2014-07-17: 1000 mL via INTRAVENOUS

## 2014-07-17 MED ORDER — MEPERIDINE HCL 100 MG/ML IJ SOLN
6.2500 mg | INTRAMUSCULAR | Status: DC | PRN
Start: 2014-07-17 — End: 2014-07-17

## 2014-07-17 MED ORDER — HYDROCODONE-ACETAMINOPHEN 5-325 MG PO TABS
1.0000 | ORAL_TABLET | Freq: Four times a day (QID) | ORAL | Status: DC | PRN
  Administered 2014-07-18 – 2014-07-19 (×3): 1 via ORAL
  Filled 2014-07-17 (×4): qty 1

## 2014-07-17 MED ORDER — MAGNESIUM SULFATE 2 GM/50ML IV SOLN
2.0000 g | Freq: Once | INTRAVENOUS | Status: AC
  Administered 2014-07-17: 2 g via INTRAVENOUS
  Filled 2014-07-17: qty 50

## 2014-07-17 MED ORDER — LACTATED RINGERS IV SOLN: INTRAVENOUS | Status: DC

## 2014-07-17 NOTE — Op Note (Signed)
[facility] [address] [place], [id number]   ENDOSCOPY PROCEDURE REPORT  PATIENT: Kara [name], Kara [name]  MR#: [id number] BIRTHDATE: [birth year] , 28  yrs. old GENDER: female ENDOSCOPIST: Dorena CookeyJohn Levorn Oleski, MD REFERRED BY: PROCEDURE DATE:  07/17/2014 PROCEDURE: ASA CLASS: INDICATIONS:   severe abdominal pain and dyspepsia MEDICATIONS:  MATC TOPICAL ANESTHETIC:  DESCRIPTION OF PROCEDURE: After the risks benefits and alternatives of the procedure were thoroughly explained, informed consent was obtained.  The Pentax Gastroscope [id number] endoscope was introduced through the mouth and advanced to the second portion of the duodenum , Without limitations.  The instrument was slowly withdrawn as the mucosa was fully examined.    severe distal erosive esophagitis grade 4 from 38 cm to 35 cm with multiple erosions no definite ulcer most consistent with reflux esophagitis        stomach : Mild antral gastritis with no focal erosions or ulcers CLOtest was obtained    duodenum: Normal The scope was then withdrawn from the patient and the procedure completed.  COMPLICATIONS: There were no immediate complications.  ENDOSCOPIC IMPRESSION: severe erosive esophagitis likely related to reflux  RECOMMENDATIONS: intensive PPI therapy and add Carafate. Await CLOtest  REPEAT EXAM:  eSigned:  Dorena CookeyJohn Aloise Copus, MD 07/17/2014 11:09 AM    CC:  CPT CODES: ICD CODES:  The ICD and CPT codes recommended by this software are interpretations from the data that the clinical staff has captured with the software.  The verification of the translation of this report to the ICD and CPT codes and modifiers is the sole responsibility of the health care institution and practicing physician where this report was generated.  [employer] will not be held responsible for the validity of the ICD and CPT codes included on this report.  AMA assumes no liability for data contained or not  contained herein. CPT is a Publishing rights managerregistered trademark of the [employer].

## 2014-07-17 NOTE — Interval H&P Note (Signed)
History and Physical Interval Note:  07/17/2014 10:44 AM  Kara [name]  has presented today for surgery, with the diagnosis of abd pain  The various methods of treatment have been discussed with the patient and family. After consideration of risks, benefits and other options for treatment, the patient has consented to  Procedure(s): ESOPHAGOGASTRODUODENOSCOPY (EGD) (N/A) as a surgical intervention .  The patient's history has been reviewed, patient examined, no change in status, stable for surgery.  I have reviewed the patient's chart and labs.  Questions were answered to the patient's satisfaction.     [name]   

## 2014-07-17 NOTE — Progress Notes (Signed)
*  PRELIMINARY RESULTS* Vascular Ultrasound Lower extremity venous duplex has been completed.  Preliminary findings: No evidence of DVT.  Farrel DemarkJill Eunice, RDMS, RVT  07/17/2014, 1:35 PM

## 2014-07-17 NOTE — Anesthesia Postprocedure Evaluation (Signed)
  Anesthesia Post-op Note  Patient: Kara [name]  Procedure(s) Performed: Procedure(s) (LRB): ESOPHAGOGASTRODUODENOSCOPY (EGD) (N/A)  Patient Location: PACU  Anesthesia Type: MAC  Level of Consciousness: awake and alert   Airway and Oxygen Therapy: Patient Spontanous Breathing  Post-op Pain: mild  Post-op Assessment: Post-op Vital signs reviewed, Patient's Cardiovascular Status Stable, Respiratory Function Stable, Patent Airway and No signs of Nausea or vomiting  Last Vitals:  Filed Vitals:   07/17/14 1135  BP:   Pulse: 107  Temp:   Resp: 20    Post-op Vital Signs: stable   Complications: No apparent anesthesia complications

## 2014-07-17 NOTE — Anesthesia Preprocedure Evaluation (Addendum)
Anesthesia Evaluation  Patient identified by MRN, date of birth, ID band Patient awake    Reviewed: Allergy & Precautions, NPO status , Patient's Chart, lab work & pertinent test results  Airway Mallampati: II  TM Distance: >3 FB Neck ROM: Full    Dental no notable dental hx. (+) Caps   Pulmonary asthma , former smoker,  breath sounds clear to auscultation  Pulmonary exam normal       Cardiovascular negative cardio ROS  Rhythm:Regular Rate:Normal     Neuro/Psych negative neurological ROS  negative psych ROS   GI/Hepatic negative GI ROS, Neg liver ROS,   Endo/Other  negative endocrine ROS  Renal/GU negative Renal ROS  negative genitourinary   Musculoskeletal negative musculoskeletal ROS (+)   Abdominal   Peds negative pediatric ROS (+)  Hematology negative hematology ROS (+)   Anesthesia Other Findings   Reproductive/Obstetrics negative OB ROS                            Anesthesia Physical Anesthesia Plan  ASA: II  Anesthesia Plan: MAC   Post-op Pain Management:    Induction:   Airway Management Planned:   Additional Equipment:   Intra-op Plan:   Post-operative Plan:   Informed Consent: I have reviewed the patients History and Physical, chart, labs and discussed the procedure including the risks, benefits and alternatives for the proposed anesthesia with the patient or authorized representative who has indicated his/her understanding and acceptance.   Dental advisory given  Plan Discussed with: CRNA  Anesthesia Plan Comments:         Anesthesia Quick Evaluation

## 2014-07-17 NOTE — Progress Notes (Signed)
PROGRESS NOTE    Kara [name] BJY:[id number] DOB: Mar 16, 1986 DOA: 07/15/2014 PCP:  Duane Lope, MD  Cardiology: Dr. Charlton Haws GI: Dr. Dorena Cookey.  HPI/Brief narrative 29 y/o F with h/o asthma, heavy alcohol dependence, GERD hospitalized in Feb 2016 for syncope and found to have prolonged QTC- worked up OP by Cards with Echo showing normal EF, missed stress test, ? Completed event monitor and started on BB, seen by OP GI for abdominal pain of 1 month duration and [place] abd showed no acute findings - started on PPI, has been weaning herself of alcohol - ? Rapidly now presented with syncope. She was lying on her couch playing on her phone, spoke to her spouse at 5 pm and he found her lying on floor next to couch at 5:30 pm with urinary incontinence. No reported Sz like activity or tongue bites. She had no warning symptoms and does not recollect anything until arrival in ED. Duration of LOC: not clear. No CP, palpitations, SOB. Chronic diffuse abd pain. Admitted for syncope.    Assessment/Plan:  Syncope  - Etiology not clear - This is her second episode this year - DD: Seizure, arrhythmia, vasovagal, alcohol intoxication - EEG: no seizure - CT Head: neg - Less likely to be structural heart disease based on recent Echo. - BAL: 59 on 3/29 - pt stated that last drink was on 3/27 - d dimer elevation non specific in the absence of SOB or CP. No recent long distance travel. Check doppler LE.  - Patient counseled extensively that she should not drive for at least 6 months -no further evaluation per cardiology.   Chronic Abdominal pain/Alcoholic Hepatitis/GERD - GI consult appreciated. - EGD showed severe esophagitis, mild antral gastritis.  - Hepatitis panel: neg - PPI - [place] abd: fatty liver  Prolonged QTC - Unclear etiology. - Replace K to >4 and Mg>2 - avoid drugs which may precipitate  - monitor on tele - On BB.  -repeat EKG improved QT at 503. Marland Kitchen   Alcohol Dependence - Possible early  withdrawal - CIWA  Hypothermia - Unclear etiology - No clinical features suggestive on infection - resolved. - TSH & FT4 normal  Sinus Tachycardia - ? Related to Alcohol withdrawal - CIWA and monitor - TSH normal   Constipation; start miralax, and docusate.   No evidence of sepsis.    Code Status: Full Family Communication: None at bedside Disposition Plan: DC home when medically stable   Consultants:  [facility]  Cardiology  Procedures:  None  Antibiotics:  None   Subjective: Chronic abdominal pain. Denies chest pain or dyspnea.  No BM in the last  7 days, passing gas.   Objective: Filed Vitals:   07/17/14 0951 07/17/14 1110 07/17/14 1120 07/17/14 1135  BP:   145/110   Pulse: 105 110 104 107  Temp: 97.5 F (36.4 C) 97.7 F (36.5 C)    TempSrc: Oral     Resp: Height:      Weight:      SpO2:  100% 100% 100%    Intake/Output Summary (Last 24 hours) at 07/17/14 1327 Last data filed at 07/17/14 1111  Gross per 24 hour  Intake    550 ml  Output      0 ml  Net    550 ml   Filed Weights   07/16/14 0321 07/16/14 1300  Weight: 94.5 kg (208 lb 5.4 oz) 94.5 kg (208 lb 5.4 oz)  Exam:  General exam:  Pleasant young female lying comfortably in bed Respiratory system: Clear. No increased work of breathing. Cardiovascular system: S1 & S2 heard, RRR. No JVD, murmurs, gallops, clicks or pedal edema. Tele: ST 110's-120's mostly Gastrointestinal system: Abdomen is nondistended, soft and nontender. Normal bowel sounds heard. Central nervous system: Alert and oriented. No focal neurological deficits. Extremities: Symmetric 5 x 5 power. No tremors.   Data Reviewed: Basic Metabolic Panel:  Recent Labs Lab 07/12/14 1941 07/15/14 2124 07/16/14 0708 07/17/14 0515  NA 136 144 138 137  K 3.6 3.4* 3.7 4.1  CL 98 107 105 105  CO2 GLUCOSE 96 117* 93 87  BUN <5* <5* <5* <5*  CREATININE 0.70 0.55 0.54 0.56  CALCIUM 8.8 8.2* 7.7*  7.9*  MG  --   --  1.4* 1.9   Liver Function Tests:  Recent Labs Lab 07/12/14 1941 07/15/14 2124 07/16/14 0708 07/17/14 0515  AST 140* 325* 300* 154*  ALT 33 82* 76* 58*  ALKPHOS 118* 128* 114 114  BILITOT 0.6 0.6 0.5 0.8  PROT 6.9 6.7 6.0 5.7*  ALBUMIN 3.1* 3.0* 2.7* 2.6*    Recent Labs Lab 07/12/14 1941 07/15/14 2124  LIPASE 26 22   No results for input(s): AMMONIA in the last 168 hours. CBC:  Recent Labs Lab 07/12/14 1941 07/15/14 2124 07/16/14 0708  WBC 7.4 7.3 9.4  NEUTROABS 4.4 4.7  --   HGB 13.9 13.3 12.0  HCT 41.9 41.4 37.5  MCV 94.2 96.1 96.2  PLT 370 406* 383   Cardiac Enzymes:  Recent Labs Lab 07/16/14 0708  CKTOTAL 78   BNP (last 3 results) No results for input(s): PROBNP in the last 8760 hours. CBG: No results for input(s): GLUCAP in the last 168 hours.  Recent Results (from the past 240 hour(s))  Blood culture (routine x 2)     Status: None (Preliminary result)   Collection Time: 07/16/14  2:37 AM  Result Value Ref Range Status   Specimen Description BLOOD RIGHT ANTECUBITAL  Final   Special Requests BOTTLES DRAWN AEROBIC AND ANAEROBIC 5CC  Final   Culture   Final           BLOOD CULTURE RECEIVED NO GROWTH TO DATE CULTURE WILL BE HELD FOR 5 DAYS BEFORE ISSUING A FINAL NEGATIVE REPORT Performed at [facility]    Report Status PENDING  Incomplete  Blood culture (routine x 2)     Status: None (Preliminary result)   Collection Time: 07/16/14  2:44 AM  Result Value Ref Range Status   Specimen Description BLOOD R HAND  Final   Special Requests BOTTLES DRAWN AEROBIC AND ANAEROBIC 5CC  Final   Culture   Final           BLOOD CULTURE RECEIVED NO GROWTH TO DATE CULTURE WILL BE HELD FOR 5 DAYS BEFORE ISSUING A FINAL NEGATIVE REPORT Performed at [facility]    Report Status PENDING  Incomplete         Studies: Ct Head Wo Contrast  07/16/2014   CLINICAL DATA:  Syncope, bilateral leg weakness.  EXAM: CT HEAD WITHOUT  CONTRAST  TECHNIQUE: Contiguous axial images were obtained from the base of the skull through the vertex without intravenous contrast.  COMPARISON:  None.  FINDINGS: Ventricles and sulci are appropriate for patient's age. No evidence for acute cortically based infarct intracranial hemorrhage, mass lesion or mass effect. Paranasal sinuses are unremarkable. Calvarium is intact. Orbits are  unremarkable.  IMPRESSION: No acute intracranial process.   Electronically Signed   By: Annia Beltrew  Davis M.D.   On: 07/16/2014 12:43   [place] Abdomen Complete  07/16/2014   CLINICAL DATA:  Right upper quadrant abdominal pain, nausea and vomiting for 2 weeks. Elevated LFTs.  EXAM: ULTRASOUND ABDOMEN COMPLETE  COMPARISON:  CT 07/13/2014  FINDINGS: Gallbladder: No gallstones or wall thickening visualized. No sonographic Murphy sign noted.  Common bile duct: Diameter: 3.7 mm, normal  Liver: No focal lesion identified. Diffusely increased and heterogeneous in echogenicity consistent with steatosis. The liver parenchyma is difficult to penetrate.  IVC: No abnormality visualized.  Pancreas: Not well visualized.  Spleen: Size and appearance within normal limits.  Right Kidney: Length: 10.0 cm. Echogenicity within normal limits. No mass or hydronephrosis visualized.  Left Kidney: Length: 10.9 cm. Echogenicity within normal limits. No mass or hydronephrosis visualized.  Abdominal aorta: No aneurysm visualized.  Other findings: None.  IMPRESSION: 1. Hepatic steatosis. 2. Otherwise unremarkable abdominal ultrasound.   Electronically Signed   By: Rubye OaksMelanie  Ehinger M.D.   On: 07/16/2014 01:14   Dg Abd Acute W/chest  07/15/2014   CLINICAL DATA:  Constipation and left-sided abdominal pain for 1 month. Worse over the last 2 weeks with nausea.  EXAM: ACUTE ABDOMEN SERIES (ABDOMEN 2 VIEW & CHEST 1 VIEW)  COMPARISON:  CT 2 days prior 07/13/2014  FINDINGS: The cardiomediastinal contours are normal. The lungs are clear. There is no free intra-abdominal air.  There is oral contrast throughout the colon from prior CT, distal colon is air-filled but normal in caliber. No dilated bowel loops to suggest obstruction. No radiopaque calculi. No acute osseous abnormalities are seen.  IMPRESSION: No bowel obstruction or free air. Oral contrast throughout the colon from CT 2 days prior.   Electronically Signed   By: Rubye OaksMelanie  Ehinger M.D.   On: 07/15/2014 23:38        Scheduled Meds: . LORazepam  0-4 mg Intravenous Q6H   Followed by  . [START ON 07/18/2014] LORazepam  0-4 mg Intravenous Q12H  . metoprolol succinate  25 mg Oral Daily  . mometasone-formoterol  2 puff Inhalation BID  . multivitamin with minerals  1 tablet Oral Daily  . pantoprazole  40 mg Oral Daily  . polyethylene glycol  17 g Oral Daily  . senna-docusate  1 tablet Oral BID  . sucralfate  1 g Oral TID WC & HS  . thiamine  100 mg Oral Daily   Or  . thiamine  100 mg Intravenous Daily   Continuous Infusions:    Active Problems:   Syncope   Prolonged Q-T interval on ECG   Alcohol abuse   Elevated LFTs   Intractable abdominal pain   Hypothermia   Upper abdominal pain    Time spent: 40 minutes.    Anadalay Macdonell, Prentiss BellsBelkys A, MD, FACP, FHM. [facility] Pager [phone]  If 7PM-7AM, please contact night-coverage [email or web address] Password [email or web address] 07/17/2014, 1:27 PM    LOS: 1 day

## 2014-07-17 NOTE — Transfer of Care (Signed)
Immediate Anesthesia Transfer of Care Note  Patient: Kara [name]  Procedure(s) Performed: Procedure(s): ESOPHAGOGASTRODUODENOSCOPY (EGD) (N/A)  Patient Location: PACU and Endoscopy Unit  Anesthesia Type:MAC  Level of Consciousness: awake, alert , oriented and patient cooperative  Airway & Oxygen Therapy: Patient Spontanous Breathing and Patient connected to face mask oxygen  Post-op Assessment: Report given to RN, Post -op Vital signs reviewed and stable and Patient moving all extremities  Post vital signs: Reviewed and stable  Last Vitals:  Filed Vitals:   07/17/14 1110  BP:   Pulse: 110  Temp:   Resp: 18    Complications: No apparent anesthesia complications

## 2014-07-17 NOTE — Progress Notes (Addendum)
    Subjective:  Denies CP or dyspnea; complains of back pain   Objective:  Filed Vitals:   07/16/14 1410 07/16/14 1928 07/16/14 2105 07/17/14 0541  BP: 123/89  114/69 114/73  Pulse: 127  118 95  Temp: 98.2 F (36.8 C)  98.7 F (37.1 C) 98.2 F (36.8 C)  TempSrc: Oral  Oral Oral  Resp: 20  18 18   Height:      Weight:      SpO2: 100% 100% 100% 100%    Intake/Output from previous day: No intake or output data in the 24 hours ending 07/17/14 0746  Physical Exam: Physical exam: Well-developed well-nourished in no acute distress.  Skin is warm and dry.  HEENT is normal.  Neck is supple.  Chest is clear to auscultation with normal expansion.  Cardiovascular exam is regular rate and rhythm.  Abdominal exam nontender or distended. No masses palpated. Extremities show no edema. neuro grossly intact    Lab Results: Basic Metabolic Panel:  Recent Labs  16/01/9602/29/16 0708 07/17/14 0515  NA 138 137  K 3.7 4.1  CL 105 105  CO2 24 24  GLUCOSE 93 87  BUN <5* <5*  CREATININE 0.54 0.56  CALCIUM 7.7* 7.9*  MG 1.4* 1.9   CBC:  Recent Labs  07/15/14 2124 07/16/14 0708  WBC 7.3 9.4  NEUTROABS 4.7  --   HGB 13.3 12.0  HCT 41.4 37.5  MCV 96.1 96.2  PLT 406* 383   Cardiac Enzymes:  Recent Labs  07/16/14 0708  CKTOTAL 78     Assessment/Plan:  1 Sycope-etiology unclear; LV function normal; history seems less consistent with cardiac etiology; no arrythmia on telemetry; she has not turned in event monitor. Patient instructed not to drive for six months.  2 Prolonged QT-occurred in setting of hypokalemia and hypomagnesemia; repeat ECG this AM. 3 ETOH abuse-pt counseled on DCing 4 elevated LFTs-further evaluation per IM 5 abdominal pain-further evaluation per IM Patient can be DCed from a cardiac standpoint and fu with Dr Eden EmmsNishan. Olga MillersBrian Crenshaw 07/17/2014, 7:46 AM   Outpatient telemetry strips reviewed from previous outpt monitor. They reveal sinus to sinus  tachycardia with no significant arrhythmias. I did discuss the patient with Dr. Johney FrameAllred. Her prolonged QT interval appeares most likely secondary to electrolyte abnormalities. There was some improvement following supplementation although still prolonged. Her recent history seems less consistent with cardiac etiology as apparently her husband called her 30 min prior to arrival and she was confused and then had no recollection of the call later. She was then found down. Question contribution from alcohol. She will need to abstain from alcohol and have electrolytes monitored closely following discharge. Continue beta blocker. Patient will need close follow-up with Dr. Eden EmmsNishan. We will review overnight telemetry tomorrow AM. Olga MillersBrian Crenshaw

## 2014-07-17 NOTE — Interval H&P Note (Signed)
History and Physical Interval Note:  07/17/2014 10:44 AM  Kara LewAmanda [name]  has presented today for surgery, with the diagnosis of abd pain  The various methods of treatment have been discussed with the patient and family. After consideration of risks, benefits and other options for treatment, the patient has consented to  Procedure(s): ESOPHAGOGASTRODUODENOSCOPY (EGD) (N/A) as a surgical intervention .  The patient's history has been reviewed, patient examined, no change in status, stable for surgery.  I have reviewed the patient's chart and labs.  Questions were answered to the patient's satisfaction.     Dezarae Mcclaran C

## 2014-07-17 NOTE — H&P (View-Only) (Signed)
    Subjective:  Denies CP or dyspnea; complains of back pain   Objective:  Filed Vitals:   07/16/14 1410 07/16/14 1928 07/16/14 2105 07/17/14 0541  BP: 123/89  114/69 114/73  Pulse: 127  118 95  Temp: 98.2 F (36.8 C)  98.7 F (37.1 C) 98.2 F (36.8 C)  TempSrc: Oral  Oral Oral  Resp: 20  18 18   Height:      Weight:      SpO2: 100% 100% 100% 100%    Intake/Output from previous day: No intake or output data in the 24 hours ending 07/17/14 0746  Physical Exam: Physical exam: Well-developed well-nourished in no acute distress.  Skin is warm and dry.  HEENT is normal.  Neck is supple.  Chest is clear to auscultation with normal expansion.  Cardiovascular exam is regular rate and rhythm.  Abdominal exam nontender or distended. No masses palpated. Extremities show no edema. neuro grossly intact    Lab Results: Basic Metabolic Panel:  Recent Labs  16/01/9602/29/16 0708 07/17/14 0515  NA 138 137  K 3.7 4.1  CL 105 105  CO2 24 24  GLUCOSE 93 87  BUN <5* <5*  CREATININE 0.54 0.56  CALCIUM 7.7* 7.9*  MG 1.4* 1.9   CBC:  Recent Labs  07/15/14 2124 07/16/14 0708  WBC 7.3 9.4  NEUTROABS 4.7  --   HGB 13.3 12.0  HCT 41.4 37.5  MCV 96.1 96.2  PLT 406* 383   Cardiac Enzymes:  Recent Labs  07/16/14 0708  CKTOTAL 78     Assessment/Plan:  1 Sycope-etiology unclear; LV function normal; history seems less consistent with cardiac etiology; no arrythmia on telemetry; she has not turned in event monitor. Patient instructed not to drive for six months.  2 Prolonged QT-occurred in setting of hypokalemia and hypomagnesemia; repeat ECG this AM. 3 ETOH abuse-pt counseled on DCing 4 elevated LFTs-further evaluation per IM 5 abdominal pain-further evaluation per IM Patient can be DCed from a cardiac standpoint and fu with Dr Eden EmmsNishan. Olga MillersBrian Joshus Rogan 07/17/2014, 7:46 AM

## 2014-07-18 ENCOUNTER — Encounter (HOSPITAL_COMMUNITY): Payer: Self-pay | Admitting: Gastroenterology

## 2014-07-18 LAB — CBC
HCT: 37.3 % (ref 36.0–46.0)
Hemoglobin: 11.9 g/dL — ABNORMAL LOW (ref 12.0–15.0)
MCH: 31.1 pg (ref 26.0–34.0)
MCHC: 31.9 g/dL (ref 30.0–36.0)
MCV: 97.4 fL (ref 78.0–100.0)
PLATELETS: 305 10*3/uL (ref 150–400)
RBC: 3.83 MIL/uL — AB (ref 3.87–5.11)
RDW: 15.9 % — ABNORMAL HIGH (ref 11.5–15.5)
WBC: 5.4 10*3/uL (ref 4.0–10.5)

## 2014-07-18 LAB — BASIC METABOLIC PANEL
ANION GAP: 5 (ref 5–15)
CALCIUM: 8.1 mg/dL — AB (ref 8.4–10.5)
CO2: 26 mmol/L (ref 19–32)
CREATININE: 0.52 mg/dL (ref 0.50–1.10)
Chloride: 103 mmol/L (ref 96–112)
Glucose, Bld: 97 mg/dL (ref 70–99)
Potassium: 4.3 mmol/L (ref 3.5–5.1)
Sodium: 134 mmol/L — ABNORMAL LOW (ref 135–145)

## 2014-07-18 LAB — CLOTEST (H. PYLORI), BIOPSY: Helicobacter screen: NEGATIVE

## 2014-07-18 MED ORDER — PANTOPRAZOLE SODIUM 40 MG PO TBEC
40.0000 mg | DELAYED_RELEASE_TABLET | Freq: Two times a day (BID) | ORAL | Status: DC
  Administered 2014-07-18 – 2014-07-19 (×2): 40 mg via ORAL
  Filled 2014-07-18 (×3): qty 1

## 2014-07-18 MED ORDER — POLYETHYLENE GLYCOL 3350 17 G PO PACK
17.0000 g | PACK | Freq: Once | ORAL | Status: AC
  Administered 2014-07-18: 17 g via ORAL
  Filled 2014-07-18: qty 1

## 2014-07-18 MED ORDER — POLYETHYLENE GLYCOL 3350 17 G PO PACK
17.0000 g | PACK | Freq: Two times a day (BID) | ORAL | Status: DC
  Administered 2014-07-18 – 2014-07-19 (×2): 17 g via ORAL
  Filled 2014-07-18 (×3): qty 1

## 2014-07-18 MED ORDER — PANTOPRAZOLE SODIUM 40 MG PO TBEC: 40.0000 mg | DELAYED_RELEASE_TABLET | Freq: Two times a day (BID) | ORAL | Status: DC

## 2014-07-18 NOTE — Progress Notes (Signed)
Clinical Social Work Department BRIEF PSYCHOSOCIAL ASSESSMENT 07/18/2014  Patient:  Kara [name], Kara [name]     Account Number:  [id number]     Admit date:  07/15/2014  Clinical Social Worker:  Peri Maris, Denair  Date/Time:  07/18/2014 01:30 PM  Referred by:  RN  Date Referred:  07/18/2014 Referred for  Substance Abuse   Other Referral:   Interview type:  Patient Other interview type:    PSYCHOSOCIAL DATA Living Status:  HUSBAND Admitted from facility:   Level of care:   Primary support name:  Eboney Claybrook Primary support relationship to patient:  SPOUSE Degree of support available:   Adequate    CURRENT CONCERNS Current Concerns  Substance Abuse   Other Concerns:    SOCIAL WORK ASSESSMENT / PLAN CSW met with Pt to discuss pattern of use based on hx of substance abuse. CSW inquired about Pt's mood and physical state. CSW also discussed with Pt her pattern of use with alcohol. Pt reported that she has greatly reduced her intake but was guarded with specific details related to amount, type, and treatment. Pt reported that over the past month she has consumed alcohol on two occassions, both in social situations. Prior to this Pt reports she was "weaning" herself off of alcohol.   Assessment/plan status:  No Further Intervention Required Other assessment/ plan:   Information/referral to community resources:   CSW provided Pt with list of community resources for substance abuse treatment.    PATIENT'S/FAMILY'S RESPONSE TO PLAN OF CARE: Pt and CSW spoke after Pt mother left the room. Pt appeared guarded AEB limited eye contact and short responses. Pt declined any referrals to substance abuse treatment but was willing to accept a list of resources. Pt denies that alcohol abuse is still a problem for her. She identifies her husband as her primary support and described her mother as supportive as well. Pt thanked CSW for the resources and reported that she would let CSW  know if any other needs arise.     Peri Maris, LCSWA 07/18/2014 1:42 PM [phone]

## 2014-07-18 NOTE — Progress Notes (Signed)
PROGRESS NOTE    Kara [name] ZOX:[id number] DOB: April 29, 1985 DOA: 07/15/2014 PCP:  Duane Lope, MD  Cardiology: Dr. Charlton Haws GI: Dr. Dorena Cookey.  HPI/Brief narrative 29 y/o F with h/o asthma, heavy alcohol dependence, GERD hospitalized in Feb 2016 for syncope and found to have prolonged QTC- worked up OP by Cards with Echo showing normal EF, missed stress test, ? Completed event monitor and started on BB, seen by OP GI for abdominal pain of 1 month duration and [place] abd showed no acute findings - started on PPI, has been weaning herself of alcohol - ? Rapidly now presented with syncope. She was lying on her couch playing on her phone, spoke to her spouse at 5 pm and he found her lying on floor next to couch at 5:30 pm with urinary incontinence. No reported Sz like activity or tongue bites. She had no warning symptoms and does not recollect anything until arrival in ED. Duration of LOC: not clear. No CP, palpitations, SOB. Chronic diffuse abd pain. Admitted for syncope.    Assessment/Plan:  Syncope  - Etiology not clear - This is her second episode this year - DD: Seizure, arrhythmia, vasovagal, alcohol intoxication - EEG: no seizure - CT Head: neg - Less likely to be structural heart disease based on recent Echo. - BAL: 59 on 3/29 - pt stated that last drink was on 3/27 - d dimer elevation non specific in the absence of SOB or CP. No recent long distance travel. doppler LE negative.  - Patient counseled extensively that she should not drive for at least 6 months -no further evaluation per cardiology. Needs to follow up with Dr Eden Emms.   Chronic Abdominal pain/Alcoholic Hepatitis/GERD - GI consult appreciated. - EGD showed severe esophagitis, mild antral gastritis.  - Hepatitis panel: neg - PPI, Carafate.  - [place] abd: fatty liver  Prolonged QTC - Unclear etiology. - Replace K to >4 and Mg>2 - avoid drugs which may precipitate  - monitor on tele - On BB.  -repeat EKG improved QT  at 503. Marland Kitchen Received IV mg 3-30.  Alcohol Dependence - Possible early withdrawal - CIWA  Hypothermia - Unclear etiology - No clinical features suggestive on infection - resolved. - TSH & FT4 normal  Sinus Tachycardia - ? Related to Alcohol withdrawal - CIWA and monitor - TSH normal   Constipation; increase frequency of  Miralax. Continue with docusate.   No evidence of sepsis.    Code Status: Full Family Communication: None at bedside Disposition Plan: DC home when medically stable   Consultants:  [facility]  Cardiology  Procedures:  None  Antibiotics:  None   Subjective: Pain better today , no BM yet. Prefer oral pain medications.   Objective: Filed Vitals:   07/18/14 8119 07/18/14 0624 07/18/14 0625 07/18/14 0930  BP: 118/71 115/74 114/71 125/77  Pulse: 90 106 113 98  Temp: 98.3 F (36.8 C) 98.3 F (36.8 C) 98.3 F (36.8 C) 97.7 F (36.5 C)  TempSrc: Oral Oral Oral Oral  Resp: Height:      Weight:      SpO2: 100% 100% 98% 99%    Intake/Output Summary (Last 24 hours) at 07/18/14 1146 Last data filed at 07/17/14 1805  Gross per 24 hour  Intake    360 ml  Output      0 ml  Net    360 ml   Filed Weights   07/16/14 0321 07/16/14 1300  Weight:  94.5 kg (208 lb 5.4 oz) 94.5 kg (208 lb 5.4 oz)     Exam:  General exam:  Pleasant young female lying comfortably in bed Respiratory system: Clear. No increased work of breathing. Cardiovascular system: S1 & S2 heard, RRR. Gastrointestinal system: Abdomen is nondistended, soft and nontender. Normal bowel sounds heard. Extremities: no edema.    Data Reviewed: Basic Metabolic Panel:  Recent Labs Lab 07/12/14 1941 07/15/14 2124 07/16/14 0708 07/17/14 0515 07/18/14 0522  NA 136 144 138 137 134*  K 3.6 3.4* 3.7 4.1 4.3  CL 98 107 105 105 103  CO2 GLUCOSE 96 117* 93 87 97  BUN <5* <5* <5* <5* <5*  CREATININE 0.70 0.55 0.54 0.56 0.52  CALCIUM 8.8 8.2* 7.7* 7.9*  8.1*  MG  --   --  1.4* 1.9  --    Liver Function Tests:  Recent Labs Lab 07/12/14 1941 07/15/14 2124 07/16/14 0708 07/17/14 0515  AST 140* 325* 300* 154*  ALT 33 82* 76* 58*  ALKPHOS 118* 128* 114 114  BILITOT 0.6 0.6 0.5 0.8  PROT 6.9 6.7 6.0 5.7*  ALBUMIN 3.1* 3.0* 2.7* 2.6*    Recent Labs Lab 07/12/14 1941 07/15/14 2124  LIPASE 26 22   No results for input(s): AMMONIA in the last 168 hours. CBC:  Recent Labs Lab 07/12/14 1941 07/15/14 2124 07/16/14 0708 07/18/14 0522  WBC 7.4 7.3 9.4 5.4  NEUTROABS 4.4 4.7  --   --   HGB 13.9 13.3 12.0 11.9*  HCT 41.9 41.4 37.5 37.3  MCV 94.2 96.1 96.2 97.4  PLT 370 406* 383 305   Cardiac Enzymes:  Recent Labs Lab 07/16/14 0708  CKTOTAL 78   BNP (last 3 results) No results for input(s): PROBNP in the last 8760 hours. CBG: No results for input(s): GLUCAP in the last 168 hours.  Recent Results (from the past 240 hour(s))  Blood culture (routine x 2)     Status: None (Preliminary result)   Collection Time: 07/16/14  2:37 AM  Result Value Ref Range Status   Specimen Description BLOOD RIGHT ANTECUBITAL  Final   Special Requests BOTTLES DRAWN AEROBIC AND ANAEROBIC 5CC  Final   Culture   Final           BLOOD CULTURE RECEIVED NO GROWTH TO DATE CULTURE WILL BE HELD FOR 5 DAYS BEFORE ISSUING A FINAL NEGATIVE REPORT Performed at [facility]    Report Status PENDING  Incomplete  Blood culture (routine x 2)     Status: None (Preliminary result)   Collection Time: 07/16/14  2:44 AM  Result Value Ref Range Status   Specimen Description BLOOD R HAND  Final   Special Requests BOTTLES DRAWN AEROBIC AND ANAEROBIC 5CC  Final   Culture   Final           BLOOD CULTURE RECEIVED NO GROWTH TO DATE CULTURE WILL BE HELD FOR 5 DAYS BEFORE ISSUING A FINAL NEGATIVE REPORT Performed at [facility]    Report Status PENDING  Incomplete         Studies: Ct Head Wo Contrast  07/16/2014   CLINICAL DATA:  Syncope,  bilateral leg weakness.  EXAM: CT HEAD WITHOUT CONTRAST  TECHNIQUE: Contiguous axial images were obtained from the base of the skull through the vertex without intravenous contrast.  COMPARISON:  None.  FINDINGS: Ventricles and sulci are appropriate for patient's age. No evidence for acute cortically based infarct intracranial hemorrhage, mass  lesion or mass effect. Paranasal sinuses are unremarkable. Calvarium is intact. Orbits are unremarkable.  IMPRESSION: No acute intracranial process.   Electronically Signed   By: Annia Beltrew  Davis M.D.   On: 07/16/2014 12:43        Scheduled Meds: . LORazepam  0-4 mg Intravenous Q12H  . metoprolol succinate  25 mg Oral Daily  . mometasone-formoterol  2 puff Inhalation BID  . multivitamin with minerals  1 tablet Oral Daily  . pantoprazole  40 mg Oral BID  . polyethylene glycol  17 g Oral BID  . polyethylene glycol  17 g Oral Once  . senna-docusate  1 tablet Oral BID  . sucralfate  1 g Oral TID WC & HS  . thiamine  100 mg Oral Daily   Or  . thiamine  100 mg Intravenous Daily   Continuous Infusions:    Active Problems:   Syncope   Prolonged Q-T interval on ECG   Alcohol abuse   Elevated LFTs   Intractable abdominal pain   Hypothermia   Upper abdominal pain   Constipation    Time spent: 40 minutes.    Kara [name], Prentiss BellsBelkys A, MD, FACP, FHM. [facility] Pager [phone]  If 7PM-7AM, please contact night-coverage [email or web address] Password [email or web address] 07/18/2014, 11:46 AM    LOS: 2 days

## 2014-07-18 NOTE — Progress Notes (Signed)
    Subjective:  Denies CP or dyspnea; complains of back and abdominal pain   Objective:  Filed Vitals:   07/17/14 2116 07/18/14 0623 07/18/14 0624 07/18/14 0625  BP: 133/90 118/71 115/74 114/71  Pulse: 90 90 106 113  Temp: 98.2 F (36.8 C) 98.3 F (36.8 C) 98.3 F (36.8 C) 98.3 F (36.8 C)  TempSrc: Oral Oral Oral Oral  Resp: 20 20 20 18   Height:      Weight:      SpO2: 98% 100% 100% 98%    Intake/Output from previous day:  Intake/Output Summary (Last 24 hours) at 07/18/14 0751 Last data filed at 07/17/14 1805  Gross per 24 hour  Intake    910 ml  Output      0 ml  Net    910 ml    Physical Exam: Physical exam: Well-developed well-nourished in no acute distress.  Skin is warm and dry.  HEENT is normal.  Neck is supple.  Chest is clear to auscultation with normal expansion.  Cardiovascular exam is regular rate and rhythm.  Abdominal exam nontender or distended. No masses palpated. Extremities show no edema. neuro grossly intact    Lab Results: Basic Metabolic Panel:  Recent Labs  16/01/9602/29/16 0708 07/17/14 0515 07/18/14 0522  NA 138 137 134*  K 3.7 4.1 4.3  CL 105 105 103  CO2 24 24 26   GLUCOSE 93 87 97  BUN <5* <5* <5*  CREATININE 0.54 0.56 0.52  CALCIUM 7.7* 7.9* 8.1*  MG 1.4* 1.9  --    CBC:  Recent Labs  07/15/14 2124 07/16/14 0708 07/18/14 0522  WBC 7.3 9.4 5.4  NEUTROABS 4.7  --   --   HGB 13.3 12.0 11.9*  HCT 41.4 37.5 37.3  MCV 96.1 96.2 97.4  PLT 406* 383 305   Cardiac Enzymes:  Recent Labs  07/16/14 0708  CKTOTAL 78     Assessment/Plan:  1 Sycope-etiology unclear; LV function normal; history seems less consistent with cardiac etiology; no arrythmia on telemetry; outpatient telemetry strips reviewed from previous outpt monitor. They reveal sinus to sinus tachycardia with no significant arrhythmias. Patient previously instructed not to drive for six months.  2 Prolonged QT-I did discuss the patient with Dr. Johney FrameAllred. Her  prolonged QT interval appeares most likely secondary to electrolyte abnormalities. There was some improvement following supplementation. Her recent history seems less consistent with cardiac etiology as apparently her husband called her 30 min prior to his arrival and she was confused and then had no recollection of the call later. She was then found down. Question contribution from alcohol. She will need to abstain from alcohol and have electrolytes monitored closely following discharge. Continue beta blocker. Patient will need close follow-up with Dr. Eden EmmsNishan following DC. 3 ETOH abuse-pt counseled on DCing 4 elevated LFTs-further evaluation per IM 5 abdominal pain-further evaluation per IM; EGD revealed esophagitis. Patient can be DCed from a cardiac standpoint and fu with Dr Eden EmmsNishan. Please call with questions. Olga MillersBrian Crenshaw 07/18/2014, 7:51 AM

## 2014-07-18 NOTE — Progress Notes (Signed)
[facility] Progress Note  Subjective: Abdominal pain and doing better no vomiting tolerating a bland diet  Objective: Vital signs in last 24 hours: Temp:  [97.7 F (36.5 C)-98.3 F (36.8 C)] 97.7 F (36.5 C) (03/31 0930) Pulse Rate:  [90-113] 98 (03/31 0930) Resp:  [18-20] 19 (03/31 0930) BP: (114-145)/(71-110) 125/77 mmHg (03/31 0930) SpO2:  [98 %-100 %] 99 % (03/31 0930) Weight change:    PE: Abdomen soft less tender  Lab Results: Results for orders placed or performed during the hospital encounter of 07/15/14 (from the past 24 hour(s))  CBC     Status: Abnormal   Collection Time: 07/18/14  5:22 AM  Result Value Ref Range   WBC 5.4 4.0 - 10.5 K/uL   RBC 3.83 (L) 3.87 - 5.11 MIL/uL   Hemoglobin 11.9 (L) 12.0 - 15.0 g/dL   HCT 16.137.3 09.636.0 - 04.546.0 %   MCV 97.4 78.0 - 100.0 fL   MCH 31.1 26.0 - 34.0 pg   MCHC 31.9 30.0 - 36.0 g/dL   RDW 40.915.9 (H) 81.111.5 - 91.415.5 %   Platelets 305 150 - 400 K/uL  Basic metabolic panel     Status: Abnormal   Collection Time: 07/18/14  5:22 AM  Result Value Ref Range   Sodium 134 (L) 135 - 145 mmol/L   Potassium 4.3 3.5 - 5.1 mmol/L   Chloride 103 96 - 112 mmol/L   CO2 26 19 - 32 mmol/L   Glucose, Bld 97 70 - 99 mg/dL   BUN <5 (L) 6 - 23 mg/dL   Creatinine, Ser 7.820.52 0.50 - 1.10 mg/dL   Calcium 8.1 (L) 8.4 - 10.5 mg/dL   GFR calc non Af Amer >90 >90 mL/min   GFR calc Af Amer >90 >90 mL/min   Anion gap 5 5 - 15    Studies/Results: Ct Head Wo Contrast  07/16/2014   CLINICAL DATA:  Syncope, bilateral leg weakness.  EXAM: CT HEAD WITHOUT CONTRAST  TECHNIQUE: Contiguous axial images were obtained from the base of the skull through the vertex without intravenous contrast.  COMPARISON:  None.  FINDINGS: Ventricles and sulci are appropriate for patient's age. No evidence for acute cortically based infarct intracranial hemorrhage, mass lesion or mass effect. Paranasal sinuses are unremarkable. Calvarium is intact. Orbits are unremarkable.   IMPRESSION: No acute intracranial process.   Electronically Signed   By: Annia Beltrew  Davis M.D.   On: 07/16/2014 12:43      Assessment: Severe erosive esophagitis related to alcohol use presumably causing patient's presenting symptoms with biliary workup negative.  Plan: Aggressive double dose PPI therapy and have added Carafate elixir. CLOtest pending. Will follow-up on results. Hopefully home tomorrow    Kejon Feild C 07/18/2014, 10:44 AM

## 2014-07-19 DIAGNOSIS — R109 Unspecified abdominal pain: Secondary | ICD-10-CM

## 2014-07-19 DIAGNOSIS — K5900 Constipation, unspecified: Secondary | ICD-10-CM

## 2014-07-19 MED ORDER — PANTOPRAZOLE SODIUM 40 MG PO TBEC: 40.0000 mg | DELAYED_RELEASE_TABLET | Freq: Two times a day (BID) | ORAL | Status: DC

## 2014-07-19 MED ORDER — ADULT MULTIVITAMIN W/MINERALS CH: 1.0000 | ORAL_TABLET | Freq: Every day | ORAL | Status: DC

## 2014-07-19 MED ORDER — SUCRALFATE 1 GM/10ML PO SUSP: 1.0000 g | Freq: Three times a day (TID) | ORAL | Status: DC

## 2014-07-19 MED ORDER — POLYETHYLENE GLYCOL 3350 17 G PO PACK: 17.0000 g | PACK | Freq: Every day | ORAL | Status: DC | PRN

## 2014-07-19 MED ORDER — HYDROCODONE-ACETAMINOPHEN 5-325 MG PO TABS: 1.0000 | ORAL_TABLET | Freq: Four times a day (QID) | ORAL | Status: DC | PRN

## 2014-07-19 NOTE — Discharge Summary (Signed)
Physician Discharge Summary  Kara [name] NWG:[id number] DOB: 1985/06/22 DOA: 07/15/2014  PCP:  Duane Lope, MD  Admit date: 07/15/2014 Discharge date: 07/19/2014  Time spent: 35 minutes  Recommendations for Outpatient Follow-up:  Needs to follow up with cardiology for prolong Qt. Needs repeat EKG Continue counseling regarding alcohol cessation.  Follow up on resolution of abdominal pain  Discharge Diagnoses:    Syncope   Prolonged Q-T interval on ECG   Alcohol abuse   Elevated LFTs   Intractable abdominal pain   Hypothermia   Upper abdominal pain   Constipation   Discharge Condition: Stable.   Diet recommendation: Regular diet   Filed Weights   07/16/14 0321 07/16/14 1300  Weight: 94.5 kg (208 lb 5.4 oz) 94.5 kg (208 lb 5.4 oz)    History of present illness:  Kara [name] is a 29 y.o. female history of alcohol abuse who was admitted in February 2016 for syncope and was found to have long QT and was eventually referred to the cardiologist and had 2-D echo done which showed EF of 55-60% with grade 2 diastolic dysfunction was brought to the ER after patient has been found patient on the floor unconscious. Patient has been states that he spoke to the patient at around 5 PM over the phone and when he reached home at around 5:30 PM patient was on the floor unconscious. Patient regained consciousness spontaneously within 1 minute and did not remember what happened. Patient had incontinence of urine also. Patient drinks alcohol everyday and has been trying to cut short her alcohol drinking over the last few weeks. Patient also has been having chronic abdominal pain and recent CAT scan abdomen done 2 weeks ago was unremarkable. Patient has follow-up with gastroenterologist and custard or this is planning procedures next month as per the patient. Patient denies any chest pain palpitations or shortness of breath. Denies any headache visual symptoms. Patient's pain is mostly in the epigastric  area. Labs revealed elevated LFTs. Sonogram of abdomen is unremarkable. Patient was tachycardic and lactic acid was elevated. Patient initially was hypothermic and was placed on warming blankets and temperature improved. Patient is not having any signs or symptoms of infectious process.  Hospital Course:  HPI/Brief narrative 29 y/o F with h/o asthma, heavy alcohol dependence, GERD hospitalized in Feb 2016 for syncope and found to have prolonged QTC- worked up OP by Cards with Echo showing normal EF, missed stress test, ? Completed event monitor and started on BB, seen by OP GI for abdominal pain of 1 month duration and [place] abd showed no acute findings - started on PPI, has been weaning herself of alcohol - ? Rapidly now presented with syncope. She was lying on her couch playing on her phone, spoke to her spouse at 5 pm and he found her lying on floor next to couch at 5:30 pm with urinary incontinence. No reported Sz like activity or tongue bites. She had no warning symptoms and does not recollect anything until arrival in ED. Duration of LOC: not clear. No CP, palpitations, SOB. Chronic diffuse abd pain. Admitted for syncope.   Assessment/Plan:  Syncope  - Etiology not clear - This is her second episode this year - DD: Seizure, arrhythmia, vasovagal, alcohol intoxication - EEG: no seizure - CT Head: neg - Less likely to be structural heart disease based on recent Echo. - BAL: 59 on 3/29 - pt stated that last drink was on 3/27 - d dimer elevation non specific in the absence of  SOB or CP. No recent long distance travel. doppler LE negative.  - Patient counseled extensively that she should not drive for at least 6 months -no further evaluation per cardiology. Needs to follow up with Dr Eden EmmsNishan.   Chronic Abdominal pain/Alcoholic Hepatitis/GERD - GI consult appreciated. - EGD showed severe esophagitis, mild antral gastritis.  - Hepatitis panel: neg - PPI, Carafate.  - US abd: fatty  liver  Prolonged QTC - Unclear etiology. - Replace K to >4 and Mg>2 - avoid drugs which may precipitate  - monitor on tele - On BB.  -repeat EKG improved QT at 503. Marland Kitchen. Received IV mg 3-30.  Alcohol Dependence - Possible early withdrawal - CIWA  Hypothermia - Unclear etiology - No clinical features suggestive on infection - resolved. - TSH & FT4 normal  Sinus Tachycardia - ? Related to Alcohol withdrawal - CIWA and monitor - TSH normal   Constipation; had BM. Continue with miralax.   No evidence of sepsis.   Procedures:   Endoscopy: severe erosive esophagitis likely related to reflux  Consultations:  GI  Cardiology  Discharge Exam: Filed Vitals:   07/19/14 0700  BP: 120/76  Pulse: 91  Temp: 98 F (36.7 C)  Resp: 20    General: Alert in no distress.  Cardiovascular: S 1, S 2 RRR Respiratory: CTA  Discharge Instructions   Discharge Instructions    Diet general    Complete by:  As directed      Increase activity slowly    Complete by:  As directed           Current Discharge Medication List    START taking these medications   Details  Multiple Vitamin (MULTIVITAMIN WITH MINERALS) TABS tablet Take 1 tablet by mouth daily. Qty: 30 tablet, Refills: 0    polyethylene glycol (MIRALAX / GLYCOLAX) packet Take 17 g by mouth daily as needed. Qty: 14 each, Refills: 0    sucralfate (CARAFATE) 1 GM/10ML suspension Take 10 mLs (1 g total) by mouth 4 (four) times daily -  with meals and at bedtime. Qty: 420 mL, Refills: 0      CONTINUE these medications which have CHANGED   Details  HYDROcodone-acetaminophen (NORCO) 5-325 MG per tablet Take 1 tablet by mouth every 6 (six) hours as needed for moderate pain. Qty: 20 tablet, Refills: 0    pantoprazole (PROTONIX) 40 MG tablet Take 1 tablet (40 mg total) by mouth 2 (two) times daily. Qty: 60 tablet, Refills: 0      CONTINUE these medications which have NOT CHANGED   Details  albuterol (PROVENTIL  HFA;VENTOLIN HFA) 108 (90 BASE) MCG/ACT inhaler Inhale 2 puffs into the lungs every 6 (six) hours as needed for wheezing or shortness of breath (wheezing and sob).     ALPRAZolam (XANAX) 0.25 MG tablet Take 1 tablet (0.25 mg total) by mouth 2 (two) times daily as needed for anxiety. Qty: 60 tablet, Refills: 3    metoprolol succinate (TOPROL-XL) 25 MG 24 hr tablet Take 1 tablet (25 mg total) by mouth daily. Qty: 30 tablet, Refills: 11    chlorhexidine (PERIDEX) 0.12 % solution Use as directed 10 mLs in the mouth or throat daily.    fexofenadine (ALLEGRA) 180 MG tablet Take 180 mg by mouth daily as needed for allergies.     mometasone-formoterol (DULERA) 200-5 MCG/ACT AERO Inhale 2 puffs into the lungs 2 (two) times daily.    montelukast (SINGULAIR) 10 MG tablet Take 10 mg by mouth daily as needed (  allergies).       STOP taking these medications     ranitidine (ZANTAC) 150 MG capsule      traMADol (ULTRAM) 50 MG tablet        Allergies  Allergen Reactions  . Peanuts [Peanut Oil] Itching    Sensitivity    Follow-up Information    Follow up with  Duane Lope, MD In 1 week.   Specialty:  Family Medicine   Contact information:   [address] [place] [address] [phone]       Follow up with HAYES,JOHN C, MD In 3 weeks.   Specialty:  Gastroenterology   Contact information:   [address] [place] [address] [phone]        The results of significant diagnostics from this hospitalization (including imaging, microbiology, ancillary and laboratory) are listed below for reference.    Significant Diagnostic Studies: Ct Head Wo Contrast  07/16/2014   CLINICAL DATA:  Syncope, bilateral leg weakness.  EXAM: CT HEAD WITHOUT CONTRAST  TECHNIQUE: Contiguous axial images were obtained from the base of the skull through the vertex without intravenous contrast.  COMPARISON:  None.  FINDINGS: Ventricles and sulci are appropriate for patient's age. No  evidence for acute cortically based infarct intracranial hemorrhage, mass lesion or mass effect. Paranasal sinuses are unremarkable. Calvarium is intact. Orbits are unremarkable.  IMPRESSION: No acute intracranial process.   Electronically Signed   By: Annia Belt M.D.   On: 07/16/2014 12:43   US Abdomen Complete  07/16/2014   CLINICAL DATA:  Right upper quadrant abdominal pain, nausea and vomiting for 2 weeks. Elevated LFTs.  EXAM: ULTRASOUND ABDOMEN COMPLETE  COMPARISON:  CT 07/13/2014  FINDINGS: Gallbladder: No gallstones or wall thickening visualized. No sonographic Murphy sign noted.  Common bile duct: Diameter: 3.7 mm, normal  Liver: No focal lesion identified. Diffusely increased and heterogeneous in echogenicity consistent with steatosis. The liver parenchyma is difficult to penetrate.  IVC: No abnormality visualized.  Pancreas: Not well visualized.  Spleen: Size and appearance within normal limits.  Right Kidney: Length: 10.0 cm. Echogenicity within normal limits. No mass or hydronephrosis visualized.  Left Kidney: Length: 10.9 cm. Echogenicity within normal limits. No mass or hydronephrosis visualized.  Abdominal aorta: No aneurysm visualized.  Other findings: None.  IMPRESSION: 1. Hepatic steatosis. 2. Otherwise unremarkable abdominal ultrasound.   Electronically Signed   By: Rubye Oaks M.D.   On: 07/16/2014 01:14   Ct Abdomen Pelvis W Contrast  07/13/2014   CLINICAL DATA:  Abdominal pain, swelling.  EXAM: CT ABDOMEN AND PELVIS WITH CONTRAST  TECHNIQUE: Multidetector CT imaging of the abdomen and pelvis was performed using the standard protocol following bolus administration of intravenous contrast.  CONTRAST:  OMNIPAQUE IOHEXOL 300 MG/ML  SOLN  COMPARISON:  Plain films 07/06/2014  FINDINGS: Lower chest: Lung bases are clear. No effusions. Heart is normal size.  Hepatobiliary: Mild diffuse fatty infiltration of the liver. No focal hepatic abnormality. Gallbladder unremarkable.  Pancreas:  No focal abnormality or ductal dilatation.  Spleen: No focal abnormality.  Normal size.  Adrenals/Urinary Tract: No focal renal or adrenal abnormality. No hydronephrosis. Urinary bladder is unremarkable.  Stomach/Bowel: Stomach, large and small bowel grossly unremarkable. Appendix is visualized and unremarkable.  Vascular/Lymphatic: No retroperitoneal or mesenteric adenopathy. Aorta normal caliber.  Reproductive: No mass or other significant abnormality.  Other: No free fluid or free air.  Musculoskeletal: No focal bone lesion or acute bony abnormality.  IMPRESSION: Diffuse fatty infiltration  of the liver. No acute findings in the abdomen or pelvis.   Electronically Signed   By: Charlett Nose M.D.   On: 07/13/2014 00:59   Dg Abd Acute W/chest  07/15/2014   CLINICAL DATA:  Constipation and left-sided abdominal pain for 1 month. Worse over the last 2 weeks with nausea.  EXAM: ACUTE ABDOMEN SERIES (ABDOMEN 2 VIEW & CHEST 1 VIEW)  COMPARISON:  CT 2 days prior 07/13/2014  FINDINGS: The cardiomediastinal contours are normal. The lungs are clear. There is no free intra-abdominal air. There is oral contrast throughout the colon from prior CT, distal colon is air-filled but normal in caliber. No dilated bowel loops to suggest obstruction. No radiopaque calculi. No acute osseous abnormalities are seen.  IMPRESSION: No bowel obstruction or free air. Oral contrast throughout the colon from CT 2 days prior.   Electronically Signed   By: Rubye Oaks M.D.   On: 07/15/2014 23:38   Dg Abd Portable 1v  07/06/2014   CLINICAL DATA:  Epigastric pain for 2 days  EXAM: PORTABLE ABDOMEN - 1 VIEW  COMPARISON:  None.  FINDINGS: No dilated loops of large or small bowel. No organomegaly. No pathologic calcifications.  IMPRESSION: No acute abdominal findings by radiograph   Electronically Signed   By: Genevive Bi M.D.   On: 07/06/2014 13:54    Microbiology: Recent Results (from the past 240 hour(s))  Blood culture (routine x  2)     Status: None (Preliminary result)   Collection Time: 07/16/14  2:37 AM  Result Value Ref Range Status   Specimen Description BLOOD RIGHT ANTECUBITAL  Final   Special Requests BOTTLES DRAWN AEROBIC AND ANAEROBIC 5CC  Final   Culture   Final           BLOOD CULTURE RECEIVED NO GROWTH TO DATE CULTURE WILL BE HELD FOR 5 DAYS BEFORE ISSUING A FINAL NEGATIVE REPORT Performed at [facility]    Report Status PENDING  Incomplete  Blood culture (routine x 2)     Status: None (Preliminary result)   Collection Time: 07/16/14  2:44 AM  Result Value Ref Range Status   Specimen Description BLOOD R HAND  Final   Special Requests BOTTLES DRAWN AEROBIC AND ANAEROBIC 5CC  Final   Culture   Final           BLOOD CULTURE RECEIVED NO GROWTH TO DATE CULTURE WILL BE HELD FOR 5 DAYS BEFORE ISSUING A FINAL NEGATIVE REPORT Performed at [facility]    Report Status PENDING  Incomplete     Labs: Basic Metabolic Panel:  Recent Labs Lab 07/12/14 1941 07/15/14 2124 07/16/14 0708 07/17/14 0515 07/18/14 0522  NA 136 144 138 137 134*  K 3.6 3.4* 3.7 4.1 4.3  CL 98 107 105 105 103  CO2 GLUCOSE 96 117* 93 87 97  BUN <5* <5* <5* <5* <5*  CREATININE 0.70 0.55 0.54 0.56 0.52  CALCIUM 8.8 8.2* 7.7* 7.9* 8.1*  MG  --   --  1.4* 1.9  --    Liver Function Tests:  Recent Labs Lab 07/12/14 1941 07/15/14 2124 07/16/14 0708 07/17/14 0515  AST 140* 325* 300* 154*  ALT 33 82* 76* 58*  ALKPHOS 118* 128* 114 114  BILITOT 0.6 0.6 0.5 0.8  PROT 6.9 6.7 6.0 5.7*  ALBUMIN 3.1* 3.0* 2.7* 2.6*    Recent Labs Lab 07/12/14 1941 07/15/14 2124  LIPASE 26 22   No results for  input(s): AMMONIA in the last 168 hours. CBC:  Recent Labs Lab 07/12/14 1941 07/15/14 2124 07/16/14 0708 07/18/14 0522  WBC 7.4 7.3 9.4 5.4  NEUTROABS 4.4 4.7  --   --   HGB 13.9 13.3 12.0 11.9*  HCT 41.9 41.4 37.5 37.3  MCV 94.2 96.1 96.2 97.4  PLT 370 406* 383 305   Cardiac  Enzymes:  Recent Labs Lab 07/16/14 0708  CKTOTAL 78   BNP: BNP (last 3 results)  Recent Labs  07/06/14 1255  BNP 79.2    ProBNP (last 3 results) No results for input(s): PROBNP in the last 8760 hours.  CBG: No results for input(s): GLUCAP in the last 168 hours.     Signed:  Hartley Barefoot A  [facility] 07/19/2014, 10:13 AM

## 2014-07-19 NOTE — Progress Notes (Signed)
[facility] Progress Note  Subjective: Patient's pain continues to improve, ready to go home  Objective: Vital signs in last 24 hours: Temp:  [97.7 F (36.5 C)-98.1 F (36.7 C)] 98 F (36.7 C) (04/01 0700) Pulse Rate:  [88-104] 91 (04/01 0700) Resp:  [20] 20 (04/01 0700) BP: (120-129)/(76-89) 120/76 mmHg (04/01 0700) SpO2:  [98 %-100 %] 100 % (04/01 0700) Weight change:    PE: Unchanged  Lab Results: No results found for this or any previous visit (from the past 24 hour(s)).  Studies/Results: No results found.    Assessment: Severe erosive esophagitis Mild gastritis probably secondary to alcohol use, CLOtest negative  Plan: Discharge on double dose proton pump inhibitor and Carafate We'll see in my office in follow-up in 3-4 weeks.    Nalleli Largent C 07/19/2014, 10:36 AM

## 2014-07-19 NOTE — Progress Notes (Signed)
Pt left at this time with her mother. Pt alert, oriented, and without c/o. Discharge instructions/precriptions given/explained with pt, and mother, verbalizing understanding.  Followup appointments noted.

## 2014-07-22 LAB — CULTURE, BLOOD (ROUTINE X 2)
Culture: NO GROWTH
Culture: NO GROWTH

## 2014-07-25 ENCOUNTER — Ambulatory Visit (HOSPITAL_COMMUNITY): Payer: BC Managed Care – PPO | Attending: Gastroenterology

## 2014-07-27 ENCOUNTER — Encounter (HOSPITAL_COMMUNITY): Payer: Self-pay | Admitting: Emergency Medicine

## 2014-07-27 ENCOUNTER — Emergency Department (HOSPITAL_COMMUNITY): Payer: BC Managed Care – PPO

## 2014-07-27 ENCOUNTER — Emergency Department (HOSPITAL_COMMUNITY)
Admission: EM | Admit: 2014-07-27 | Discharge: 2014-07-27 | Disposition: A | Discharge status: Home / Self Care | Payer: BC Managed Care – PPO | Attending: Emergency Medicine | Admitting: Emergency Medicine

## 2014-07-27 DIAGNOSIS — R000 Tachycardia, unspecified: Secondary | ICD-10-CM | POA: Diagnosis not present

## 2014-07-27 DIAGNOSIS — W19XXXA Unspecified fall, initial encounter: Secondary | ICD-10-CM

## 2014-07-27 DIAGNOSIS — K219 Gastro-esophageal reflux disease without esophagitis: Secondary | ICD-10-CM | POA: Insufficient documentation

## 2014-07-27 DIAGNOSIS — J45909 Unspecified asthma, uncomplicated: Secondary | ICD-10-CM | POA: Insufficient documentation

## 2014-07-27 DIAGNOSIS — R251 Tremor, unspecified: Secondary | ICD-10-CM | POA: Diagnosis not present

## 2014-07-27 DIAGNOSIS — F1110 Opioid abuse, uncomplicated: Secondary | ICD-10-CM | POA: Insufficient documentation

## 2014-07-27 DIAGNOSIS — R2681 Unsteadiness on feet: Secondary | ICD-10-CM

## 2014-07-27 DIAGNOSIS — Z79899 Other long term (current) drug therapy: Secondary | ICD-10-CM | POA: Insufficient documentation

## 2014-07-27 DIAGNOSIS — R531 Weakness: Secondary | ICD-10-CM | POA: Diagnosis present

## 2014-07-27 DIAGNOSIS — R2689 Other abnormalities of gait and mobility: Secondary | ICD-10-CM | POA: Insufficient documentation

## 2014-07-27 DIAGNOSIS — Z87891 Personal history of nicotine dependence: Secondary | ICD-10-CM | POA: Diagnosis not present

## 2014-07-27 DIAGNOSIS — F1310 Sedative, hypnotic or anxiolytic abuse, uncomplicated: Secondary | ICD-10-CM | POA: Insufficient documentation

## 2014-07-27 DIAGNOSIS — Y92009 Unspecified place in unspecified non-institutional (private) residence as the place of occurrence of the external cause: Secondary | ICD-10-CM

## 2014-07-27 LAB — PROTIME-INR
INR: 0.95 (ref 0.00–1.49)
PROTHROMBIN TIME: 12.8 s (ref 11.6–15.2)

## 2014-07-27 LAB — CBC WITH DIFFERENTIAL/PLATELET
BASOS ABS: 0.1 10*3/uL (ref 0.0–0.1)
Basophils Relative: 2 % — ABNORMAL HIGH (ref 0–1)
EOS ABS: 0.5 10*3/uL (ref 0.0–0.7)
Eosinophils Relative: 7 % — ABNORMAL HIGH (ref 0–5)
HCT: 39.1 % (ref 36.0–46.0)
Hemoglobin: 12.7 g/dL (ref 12.0–15.0)
LYMPHS ABS: 2.4 10*3/uL (ref 0.7–4.0)
LYMPHS PCT: 29 % (ref 12–46)
MCH: 31.3 pg (ref 26.0–34.0)
MCHC: 32.5 g/dL (ref 30.0–36.0)
MCV: 96.3 fL (ref 78.0–100.0)
MONOS PCT: 12 % (ref 3–12)
Monocytes Absolute: 1 10*3/uL (ref 0.1–1.0)
NEUTROS ABS: 4.3 10*3/uL (ref 1.7–7.7)
NEUTROS PCT: 52 % (ref 43–77)
PLATELETS: 390 10*3/uL (ref 150–400)
RBC: 4.06 MIL/uL (ref 3.87–5.11)
RDW: 15.5 % (ref 11.5–15.5)
WBC: 8.3 10*3/uL (ref 4.0–10.5)

## 2014-07-27 LAB — RAPID URINE DRUG SCREEN, HOSP PERFORMED
Amphetamines: NOT DETECTED
Barbiturates: NOT DETECTED
Benzodiazepines: POSITIVE — AB
Cocaine: NOT DETECTED
Opiates: POSITIVE — AB
Tetrahydrocannabinol: NOT DETECTED

## 2014-07-27 LAB — COMPREHENSIVE METABOLIC PANEL
ALT: 23 U/L (ref 0–35)
AST: 37 U/L (ref 0–37)
Albumin: 3.5 g/dL (ref 3.5–5.2)
Alkaline Phosphatase: 83 U/L (ref 39–117)
Anion gap: 12 (ref 5–15)
BILIRUBIN TOTAL: 0.7 mg/dL (ref 0.3–1.2)
BUN: 9 mg/dL (ref 6–23)
CO2: 23 mmol/L (ref 19–32)
Calcium: 9 mg/dL (ref 8.4–10.5)
Chloride: 101 mmol/L (ref 96–112)
Creatinine, Ser: 0.75 mg/dL (ref 0.50–1.10)
GFR calc Af Amer: >90 mL/min (ref >90)
Glucose, Bld: 117 mg/dL — ABNORMAL HIGH (ref 70–99)
Potassium: 4 mmol/L (ref 3.5–5.1)
Sodium: 136 mmol/L (ref 135–145)
Total Protein: 7 g/dL (ref 6.0–8.3)

## 2014-07-27 LAB — I-STAT BETA HCG BLOOD, ED (MC, WL, AP ONLY): I-stat hCG, quantitative: <5 m[IU]/mL (ref <5)

## 2014-07-27 LAB — URINALYSIS, ROUTINE W REFLEX MICROSCOPIC
Bilirubin Urine: NEGATIVE
GLUCOSE, UA: NEGATIVE mg/dL
HGB URINE DIPSTICK: NEGATIVE
KETONES UR: NEGATIVE mg/dL
Nitrite: NEGATIVE
Protein, ur: NEGATIVE mg/dL
SPECIFIC GRAVITY, URINE: 1.02 (ref 1.005–1.030)
Urobilinogen, UA: 0.2 mg/dL (ref 0.0–1.0)
pH: 6 (ref 5.0–8.0)

## 2014-07-27 LAB — URINE MICROSCOPIC-ADD ON

## 2014-07-27 LAB — SEDIMENTATION RATE: SED RATE: 31 mm/h — AB (ref 0–22)

## 2014-07-27 LAB — MAGNESIUM: MAGNESIUM: 1.7 mg/dL (ref 1.5–2.5)

## 2014-07-27 LAB — CK: Total CK: 55 U/L (ref 7–177)

## 2014-07-27 LAB — APTT: APTT: 29 s (ref 24–37)

## 2014-07-27 LAB — ETHANOL: Alcohol, Ethyl (B): <5 mg/dL (ref 0–9)

## 2014-07-27 MED ORDER — SODIUM CHLORIDE 0.9 % IV BOLUS (SEPSIS)
1000.0000 mL | Freq: Once | INTRAVENOUS | Status: AC
  Administered 2014-07-27: 1000 mL via INTRAVENOUS

## 2014-07-27 MED ORDER — LORAZEPAM 2 MG/ML IJ SOLN
2.0000 mg | Freq: Once | INTRAMUSCULAR | Status: AC
  Administered 2014-07-27: 2 mg via INTRAVENOUS
  Filled 2014-07-27: qty 1

## 2014-07-27 MED ORDER — LORAZEPAM 1 MG PO TABS
0.0000 mg | ORAL_TABLET | Freq: Four times a day (QID) | ORAL | Status: DC
  Administered 2014-07-27: 1 mg via ORAL
  Filled 2014-07-27: qty 1

## 2014-07-27 MED ORDER — LORAZEPAM 2 MG/ML IJ SOLN: 1.0000 mg | Freq: Once | INTRAMUSCULAR | Status: DC

## 2014-07-27 MED ORDER — THIAMINE HCL 100 MG/ML IJ SOLN
Freq: Once | INTRAVENOUS | Status: AC
  Administered 2014-07-27: 17:00:00 via INTRAVENOUS
  Filled 2014-07-27: qty 1000

## 2014-07-27 MED ORDER — LORAZEPAM 1 MG PO TABS: 0.0000 mg | ORAL_TABLET | Freq: Two times a day (BID) | ORAL | Status: DC

## 2014-07-27 NOTE — ED Notes (Addendum)
Pt c/o difficulty ambulating and weakness onset Friday last week, edema and discoloration to hands, feet, and and legs, pt fell on Sunday and twice on Thursday, [facility] to [facility] and sent to ED for MRI and neuro consult. Pt states that she feels "as though I am leaning to the right so I overcompensate and lean to the left when sitting, as if on the water." Pt recently admitted to hospital for GI issues. HR 135.

## 2014-07-27 NOTE — ED Provider Notes (Signed)
Medical screening examination/treatment/procedure(s) were conducted as a shared visit with non-physician practitioner(s) and myself.  I personally evaluated the patient during the encounter.   EKG Interpretation   Date/Time:  Saturday July 27 2014 17:05:45 EDT Ventricular Rate:  117 PR Interval:  142 QRS Duration: 88 QT Interval:  359 QTC Calculation: 501 R Axis:   64 Text Interpretation:  Sinus tachycardia Low voltage, precordial leads  Prolonged QT interval No significant change since last tracing Confirmed  by Zakary Kimura  MD, Aliveah Gallant ([id number]) on 07/27/2014 5:16:33 PM     Patient here with tremors and gait ataxia. Does abuse alcohol. Suspect that she is going through withdrawal Given Ativan feels better. Patient had brain MRI to rule out stroke. Given thiamine and folate. We'll admit for observation  Lorre NickAnthony Rayley Gao, MD 07/27/14 [phone]

## 2014-07-27 NOTE — ED Notes (Signed)
Nurse is getting blood 

## 2014-07-27 NOTE — Discharge Instructions (Signed)
Please follow with your primary care doctor in the next 2 days for a check-up. They must obtain records for further management.  ° °Do not hesitate to return to the Emergency Department for any new, worsening or concerning symptoms.  ° °

## 2014-07-27 NOTE — ED Notes (Signed)
Patient transported to MRI 

## 2014-07-27 NOTE — ED Notes (Signed)
Bed: WA10 Expected date:  Expected time:  Means of arrival:  Comments: Pt getting dressed 

## 2014-07-27 NOTE — Consult Note (Signed)
Consult Reason for Consult: gait instability Referring Physician: Dr Shawna Clamp ED  CC: gait instability  HPI: Kara [name] is an 29 y.o. female hx of EtOH abuse presenting with symptoms of ataxia, gait instability and frequent falls over the past 2 weeks. Notes sensation of "being on a ship" with veering to the right when sitting up. While standing/walking notes feeling unsteady. No true vertigo sensation.  MRI brain completed in the ED. Imaging reviewed and overall unremarkable, no signs of acute infarct or lesion.   Admitted on 3/29 for syncope vs seizure. Had EEG done at that time which was unremarkable. Home meds include xanax and norco. Patient notes losing 30 to 40 pounds over the past few months due to GI disturbance. She also has intermittent swelling and discoloration of her hands.   Past Medical History  Diagnosis Date  . Asthma   . GERD (gastroesophageal reflux disease)   . Alcohol abuse   . Environmental allergies     Past Surgical History  Procedure Laterality Date  . Tympanostomy tube placement    . Esophagogastroduodenoscopy N/A 07/17/2014    Procedure: ESOPHAGOGASTRODUODENOSCOPY (EGD);  Surgeon: Dorena Cookey, MD;  Location: [facility];  Service: Endoscopy;  Laterality: N/A;    Family History  Problem Relation Age of Onset  . Heart failure Mother   . Heart failure Father   . Heart failure Maternal Grandfather     Social History:  reports that she quit smoking about 2 years ago. Her smoking use included Cigarettes. She has a 1 pack-year smoking history. She has never used smokeless tobacco. She reports that she drinks alcohol. She reports that she does not use illicit drugs.  Allergies  Allergen Reactions  . Peanuts [Peanut Oil] Itching    Sensitivity     Medications: I have reviewed the patient's current medications.   ROS: Out of a complete 14 system review, the patient complains of only the following symptoms, and all other reviewed systems are  negative.  Physical Examination: Filed Vitals:   07/27/14 1953  BP: 119/77  Pulse: 126  Temp:   Resp:    Physical Exam  Constitutional: He appears well-developed and well-nourished.  Psych: Affect appropriate to situation Eyes: No scleral injection HENT: No OP obstrucion Head: Normocephalic.  Cardiovascular: Normal rate and regular rhythm.  Respiratory: Effort normal and breath sounds normal.  GI: Soft. Bowel sounds are normal. No distension. There is no tenderness.  Skin: WDI  Neurologic Examination Mental Status: Alert, oriented, thought content appropriate.  Speech fluent without evidence of aphasia.  Able to follow 3 step commands without difficulty. Cranial Nerves: II: optic discs not visualized, visual fields grossly normal, pupils equal, round, reactive to light III,IV, VI: ptosis not present, extra-ocular motions intact bilaterally V,VII: smile symmetric, facial light touch sensation normal bilaterally VIII: hearing normal bilaterally IX,X: gag reflex present XI: trapezius strength/neck flexion strength normal bilaterally XII: tongue strength normal  Motor: Strength  Tone and bulk:normal tone throughout; no atrophy noted Sensory: Pinprick and light touch intact throughout, bilaterally Deep Tendon Reflexes: 2+ and symmetric throughout Plantars: Right: downgoing   Left: downgoing Cerebellar: normal finger-to-nose, normal rapid alternating movements and normal heel-to-shin test Gait: normal gait and station  Laboratory Studies:   Basic Metabolic Panel:  Recent Labs Lab 07/27/14 1733  NA 136  K 4.0  CL 101  CO2 23  GLUCOSE 117*  BUN 9  CREATININE 0.75  CALCIUM 9.0  MG 1.7    Liver Function Tests:  Recent Labs Lab  07/27/14 1733  AST 37  ALT 23  ALKPHOS 83  BILITOT 0.7  PROT 7.0  ALBUMIN 3.5   No results for input(s): LIPASE, AMYLASE in the last 168 hours. No results for input(s): AMMONIA in the last 168 hours.  CBC:  Recent  Labs Lab 07/27/14 1733  WBC 8.3  NEUTROABS 4.3  HGB 12.7  HCT 39.1  MCV 96.3  PLT 390    Cardiac Enzymes: No results for input(s): CKTOTAL, CKMB, CKMBINDEX, TROPONINI in the last 168 hours.  BNP: Invalid input(s): POCBNP  CBG: No results for input(s): GLUCAP in the last 168 hours.  Microbiology: Results for orders placed or performed during the hospital encounter of 07/15/14  Blood culture (routine x 2)     Status: None   Collection Time: 07/16/14  2:37 AM  Result Value Ref Range Status   Specimen Description BLOOD RIGHT ANTECUBITAL  Final   Special Requests BOTTLES DRAWN AEROBIC AND ANAEROBIC 5CC  Final   Culture   Final    NO GROWTH 5 DAYS Performed at [facility]    Report Status 07/22/2014 FINAL  Final  Blood culture (routine x 2)     Status: None   Collection Time: 07/16/14  2:44 AM  Result Value Ref Range Status   Specimen Description BLOOD R HAND  Final   Special Requests BOTTLES DRAWN AEROBIC AND ANAEROBIC 5CC  Final   Culture   Final    NO GROWTH 5 DAYS Performed at [facility]    Report Status 07/22/2014 FINAL  Final    Coagulation Studies:  Recent Labs  07/27/14 1733  LABPROT 12.8  INR 0.95    Urinalysis:  Recent Labs Lab 07/27/14 1658  COLORURINE YELLOW  LABSPEC 1.020  PHURINE 6.0  GLUCOSEU NEGATIVE  HGBUR NEGATIVE  BILIRUBINUR NEGATIVE  KETONESUR NEGATIVE  PROTEINUR NEGATIVE  UROBILINOGEN 0.2  NITRITE NEGATIVE  LEUKOCYTESUR SMALL*    Lipid Panel:  No results found for: CHOL, TRIG, HDL, CHOLHDL, VLDL, LDLCALC  HgbA1C: No results found for: HGBA1C  Urine Drug Screen:     Component Value Date/Time   LABOPIA POSITIVE* 07/27/2014 1658   COCAINSCRNUR NONE DETECTED 07/27/2014 1658   LABBENZ POSITIVE* 07/27/2014 1658   AMPHETMU NONE DETECTED 07/27/2014 1658   THCU NONE DETECTED 07/27/2014 1658   LABBARB NONE DETECTED 07/27/2014 1658    Alcohol Level:  Recent Labs Lab 07/27/14 1733  ETH <5    Other  results:  Imaging: Mr Brain Wo Contrast  07/27/2014   CLINICAL DATA:  29 year old female with difficulty ambulating. History of 3 falls over the past week. Did not hit head. History of alcohol abuse. Subsequent encounter.  EXAM: MRI HEAD WITHOUT CONTRAST  TECHNIQUE: Multiplanar, multiecho pulse sequences of the brain and surrounding structures were obtained without intravenous contrast.  COMPARISON:  07/16/2014 head CT.  No comparison MR.  FINDINGS: Exam is motion degraded.  No acute infarct.  No intracranial hemorrhage.  Mild atrophy without hydrocephalus.  No intracranial mass lesion noted on this unenhanced exam.  Major intracranial vascular structures are patent.  Limited evaluation of the cervical medullary junction secondary to motion. No gross abnormality detected.  IMPRESSION:  Exam is motion degraded and somewhat limited.  No acute infarct.  No intracranial hemorrhage.  Mild atrophy without hydrocephalus.  No intracranial mass lesion noted on this unenhanced motion degraded exam.   Electronically Signed   By: Lacy DuverneySteven  Olson M.D.   On: 07/27/2014 19:11     Assessment/Plan:  28yo woman  hx of EtOH abuse presenting to ED for 2-3 week history of gait instability with falls x 2 weeks. MRI brain unremarkable. Unclear etiology of symptoms. Presentation not consistent with EtOH withdrawal. Will check for autoimmune or possible vitamin deficiency. Would check MRI C spine though low suspicion for cervical pathology (can be done as outpatient). Question if there may be a psych/anxiety component to her gait disorder.   -MRI C spine -B12, zinc, copper, ANA, CK, RF -outpatient neurology follow up  Elspeth Cho, DO [facility] [phone]  If 7pm- 7am, please page neurology on call as listed in [facility]. 07/27/2014, 9:30 PM

## 2014-07-27 NOTE — ED Notes (Signed)
MD at bedside. 

## 2014-07-27 NOTE — ED Provider Notes (Signed)
CSN: [id number]     Arrival date & time 07/27/14  1505 History   First MD Initiated Contact with Patient 07/27/14 1616     Chief Complaint  Patient presents with  . Gait Problem  . Leg Swelling  . Weakness     (Consider location/radiation/quality/duration/timing/severity/associated sxs/prior Treatment) HPI  Kara [name] is a 29 y.o. female past medical history significant for asthma, GERD, erosive esophagitis, alcohol abuse (questionable remission: states she had 2 beers yesterday) accompanied by her mother complaining of ataxia (feels like her legs are giving out at the knees) and multiple falls over the last 2 weeks.  Mother has to assist her to the restroom. Patient was recently seen and admitted to the hospital for evaluation of hypothermia, syncope versus seizure and severe abdominal pain. Patient is been compliant with her medications since discharge. She was seen at her primary care office and instructed to report immediately to the ED, she finished the work week and presents today . She has a half-sister that has a Chiari malformation. Patient reports severe right-sided low back pain.  States that hands and feet are pain/ful numb, swelling intermittently. She states that she feels like she is on a ship and that she is leaning to her right so she corrects by leaning to her left. She denies history of drug use, cange bowel or bladder habits.  She denies tinnitus, ear pain, fever, chills, saddle anesthesia, IV drug use, nausea, vomiting, headache, change in vision, dysarthria, cervicalgia.  Past Medical History  Diagnosis Date  . Asthma   . GERD (gastroesophageal reflux disease)   . Alcohol abuse   . Environmental allergies    Past Surgical History  Procedure Laterality Date  . Tympanostomy tube placement    . Esophagogastroduodenoscopy N/A 07/17/2014    Procedure: ESOPHAGOGASTRODUODENOSCOPY (EGD);  Surgeon: Dorena Cookey, MD;  Location: [facility];  Service: Endoscopy;  Laterality:  N/A;   Family History  Problem Relation Age of Onset  . Heart failure Mother   . Heart failure Father   . Heart failure Maternal Grandfather    History  Substance Use Topics  . Smoking status: Former Smoker -- 0.50 packs/day for 2 years    Types: Cigarettes    Quit date: 04/08/2012  . Smokeless tobacco: Never Used  . Alcohol Use: 0.0 oz/week    0 Standard drinks or equivalent per week     Comment: ETOH abuse, 8 shots/ day   OB History    No data available     Review of Systems  10 systems reviewed and found to be negative, except as noted in the HPI.   Allergies  Peanuts  Home Medications   Prior to Admission medications   Medication Sig Start Date End Date Taking? Authorizing Provider  albuterol (PROVENTIL HFA;VENTOLIN HFA) 108 (90 BASE) MCG/ACT inhaler Inhale 2 puffs into the lungs every 6 (six) hours as needed for wheezing or shortness of breath (wheezing and sob).    Yes Historical Provider, MD  ALPRAZolam (XANAX) 0.25 MG tablet Take 1 tablet (0.25 mg total) by mouth 2 (two) times daily as needed for anxiety. Patient taking differently: Take 0.25 mg by mouth daily.  06/11/14  Yes Wendall Stade, MD  chlorhexidine (PERIDEX) 0.12 % solution Use as directed 10 mLs in the mouth or throat daily.   Yes Historical Provider, MD  fexofenadine (ALLEGRA) 180 MG tablet Take 180 mg by mouth daily as needed for allergies (allergies).    Yes Historical Provider, MD  HYDROcodone-acetaminophen (  NORCO) 5-325 MG per tablet Take 1 tablet by mouth every 6 (six) hours as needed for moderate pain. 07/19/14  Yes Belkys A Regalado, MD  metoprolol succinate (TOPROL-XL) 25 MG 24 hr tablet Take 1 tablet (25 mg total) by mouth daily. 06/17/14  Yes Wendall Stade, MD  mometasone-formoterol [facility]) 200-5 MCG/ACT AERO Inhale 2 puffs into the lungs 2 (two) times daily.   Yes Historical Provider, MD  montelukast (SINGULAIR) 10 MG tablet Take 10 mg by mouth daily.    Yes Historical Provider, MD  Multiple  Vitamin (MULTIVITAMIN WITH MINERALS) TABS tablet Take 1 tablet by mouth daily. 07/19/14  Yes Belkys A Regalado, MD  pantoprazole (PROTONIX) 40 MG tablet Take 1 tablet (40 mg total) by mouth 2 (two) times daily. 07/19/14  Yes Belkys A Regalado, MD  polyethylene glycol (MIRALAX / GLYCOLAX) packet Take 17 g by mouth daily as needed. 07/19/14  Yes Belkys A Regalado, MD  sucralfate (CARAFATE) 1 GM/10ML suspension Take 10 mLs (1 g total) by mouth 4 (four) times daily -  with meals and at bedtime. 07/19/14  Yes Belkys A Regalado, MD   BP 130/89 mmHg  Pulse 117  Temp(Src) 98 F (36.7 C) (Oral)  Resp 18  SpO2 98%  LMP 07/05/2014 Physical Exam  Constitutional: She is oriented to person, place, and time. She appears well-developed and well-nourished. No distress.  Cushingoid appearance with moon facies, thinning hair, central adiposity.  HENT:  Head: Normocephalic.  Mouth/Throat: Oropharynx is clear and moist.  Positive tongue fasciculations  Eyes: Conjunctivae and EOM are normal. Pupils are equal, round, and reactive to light.  Neck: Normal range of motion.  Cardiovascular: Regular rhythm and intact distal pulses.   Tachycardic  Pulmonary/Chest: Effort normal and breath sounds normal. No stridor.  Abdominal: Soft. Bowel sounds are normal. She exhibits no distension and no mass. There is no rebound and no guarding.  Musculoskeletal: Normal range of motion. She exhibits edema.  Trace nonpitting peripheral edema.  Neurological: She is alert and oriented to person, place, and time.  Bilateral hand tremors  II-Visual fields grossly intact. III/IV/VI-Extraocular movements intact.  Pupils reactive bilaterally. V/VII-Smile symmetric, equal eyebrow raise,  facial sensation intact VIII- Hearing grossly intact IX/X-Normal gag XI-bilateral shoulder shrug XII-midline tongue extension Motor: 5/5 bilaterally with normal tone and bulk Cerebellar: No dysmetria on finger-to-nose but fingers are tremulous  bilaterally and normal heel-to-shin test.   Romberg negative Ambulates with antalgic gait, very unsteady   Psychiatric: She has a normal mood and affect.  Nursing note and vitals reviewed.   ED Course  Procedures (including critical care time) Labs Review Labs Reviewed  URINALYSIS, ROUTINE W REFLEX MICROSCOPIC - Abnormal; Notable for the following:    APPearance CLOUDY (*)    Leukocytes, UA SMALL (*)    All other components within normal limits  URINE RAPID DRUG SCREEN (HOSP PERFORMED) - Abnormal; Notable for the following:    Opiates POSITIVE (*)    Benzodiazepines POSITIVE (*)    All other components within normal limits  CBC WITH DIFFERENTIAL/PLATELET - Abnormal; Notable for the following:    Eosinophils Relative 7 (*)    Basophils Relative 2 (*)    All other components within normal limits  COMPREHENSIVE METABOLIC PANEL - Abnormal; Notable for the following:    Glucose, Bld 117 (*)    All other components within normal limits  SEDIMENTATION RATE - Abnormal; Notable for the following:    Sed Rate 31 (*)    All other components  within normal limits  URINE MICROSCOPIC-ADD ON - Abnormal; Notable for the following:    Squamous Epithelial / LPF MANY (*)    Bacteria, UA MANY (*)    All other components within normal limits  ETHANOL  MAGNESIUM  PROTIME-INR  APTT  VITAMIN B12  ZINC  COPPER, SERUM  CK  RHEUMATOID FACTOR  I-STAT BETA HCG BLOOD, ED ([facility], [facility], [facility] ONLY)    Imaging Review Mr Brain Wo Contrast  07/27/2014   CLINICAL DATA:  29 year old female with difficulty ambulating. History of 3 falls over the past week. Did not hit head. History of alcohol abuse. Subsequent encounter.  EXAM: MRI HEAD WITHOUT CONTRAST  TECHNIQUE: Multiplanar, multiecho pulse sequences of the brain and surrounding structures were obtained without intravenous contrast.  COMPARISON:  07/16/2014 head CT.  No comparison MR.  FINDINGS: Exam is motion degraded.  No acute infarct.  No intracranial  hemorrhage.  Mild atrophy without hydrocephalus.  No intracranial mass lesion noted on this unenhanced exam.  Major intracranial vascular structures are patent.  Limited evaluation of the cervical medullary junction secondary to motion. No gross abnormality detected.  IMPRESSION:  Exam is motion degraded and somewhat limited.  No acute infarct.  No intracranial hemorrhage.  Mild atrophy without hydrocephalus.  No intracranial mass lesion noted on this unenhanced motion degraded exam.   Electronically Signed   By: Lacy DuverneySteven  Olson M.D.   On: 07/27/2014 19:11     EKG Interpretation   Date/Time:  Saturday July 27 2014 17:05:45 EDT Ventricular Rate:  117 PR Interval:  142 QRS Duration: 88 QT Interval:  359 QTC Calculation: 501 R Axis:   64 Text Interpretation:  Sinus tachycardia Low voltage, precordial leads  Prolonged QT interval No significant change since last tracing Confirmed  by Freida BusmanALLEN  MD, ANTHONY ([id number]) on 07/27/2014 5:16:33 PM      MDM   Final diagnoses:  Fall at home, initial encounter  Tremor  Tachycardia    Filed Vitals:   07/27/14 1529 07/27/14 1950 07/27/14 1953 07/27/14 2212  BP: 120/84 119/77 119/77 130/89  Pulse: 131 126 126 117  Temp: 98.4 F (36.9 C)   98 F (36.7 C)  TempSrc: Oral   Oral  Resp: 16 19  18   SpO2: 96% 99%  98%    Medications  LORazepam (ATIVAN) tablet 0-4 mg (1 mg Oral Given 07/27/14 1958)    Followed by  LORazepam (ATIVAN) tablet 0-4 mg (not administered)  sodium chloride 0.9 % 1,000 mL with thiamine 100 mg, folic acid 1 mg, multivitamins adult 10 mL, potassium chloride 10 mEq, magnesium sulfate 1 g infusion ( Intravenous Stopped 07/27/14 2334)  LORazepam (ATIVAN) injection 2 mg (2 mg Intravenous Given 07/27/14 1719)  sodium chloride 0.9 % bolus 1,000 mL (0 mLs Intravenous Stopped 07/27/14 2334)    Karma Lewmanda Allor is a pleasant 29 y.o. female presenting with ataxia, frequent falls. Patient is tachycardic, tremulous and there are tongue fasciculations.  Neuro exam is grossly normal except she is is very unsteady on her feet.  Blood work with no significant abnormality, sedimentation rate is slightly elevated at 31. UDS is positive for opiates and benzos, this is likely secondary to medication she was given the hospital last week. MRI is negative. Think this may be related to alcohol withdrawal however considering her frequent falls have recommended she come into the hospital for further workup. Patient has declined this, will have neuro consult.   Center has evaluated the patient, deemed her appropriate for discharge from  neurology perspective. I have again tried to encourage the patient to stay for inpatient admission and further workup she is declined. Patient is alert and oriented 3, does not appear intoxicated andhas capacity for medical decision making. I have encouraged her to return to the ED if she changes her mind.  This is a shared visit with the attending physician who personally evaluated the patient and agrees with the care plan.   Evaluation does not show pathology that would require ongoing emergent intervention or inpatient treatment. Pt is hemodynamically stable and mentating appropriately. Discussed findings and plan with patient/guardian, who agrees with care plan. All questions answered. Return precautions discussed and outpatient follow up given.       Wynetta Emery, PA-C 07/27/14 [phone]

## 2014-07-28 LAB — VITAMIN B12: Vitamin B-12: 693 pg/mL (ref 211–911)

## 2014-07-29 LAB — RHEUMATOID FACTOR: RHEUMATOID FACTOR: 9.3 [IU]/mL (ref 0.0–13.9)

## 2014-07-30 LAB — ZINC: Zinc: 70 ug/dL (ref 56–134)

## 2014-07-31 ENCOUNTER — Encounter: Payer: BC Managed Care – PPO | Admitting: Cardiology

## 2014-08-01 ENCOUNTER — Institutional Professional Consult (permissible substitution): Payer: BC Managed Care – PPO | Admitting: Internal Medicine

## 2014-08-02 ENCOUNTER — Telehealth: Payer: Self-pay | Admitting: Neurology

## 2014-08-02 ENCOUNTER — Encounter: Payer: Self-pay | Admitting: Neurology

## 2014-08-02 ENCOUNTER — Ambulatory Visit (INDEPENDENT_AMBULATORY_CARE_PROVIDER_SITE_OTHER): Payer: BC Managed Care – PPO | Admitting: Neurology

## 2014-08-02 VITALS — BP 126/82 | HR 94 | Resp 16 | Ht 64.0 in | Wt 208.0 lb

## 2014-08-02 DIAGNOSIS — R200 Anesthesia of skin: Secondary | ICD-10-CM | POA: Insufficient documentation

## 2014-08-02 DIAGNOSIS — R208 Other disturbances of skin sensation: Secondary | ICD-10-CM | POA: Diagnosis not present

## 2014-08-02 DIAGNOSIS — M6289 Other specified disorders of muscle: Secondary | ICD-10-CM | POA: Diagnosis not present

## 2014-08-02 DIAGNOSIS — R29898 Other symptoms and signs involving the musculoskeletal system: Secondary | ICD-10-CM | POA: Diagnosis not present

## 2014-08-02 DIAGNOSIS — R260 Ataxic gait: Secondary | ICD-10-CM | POA: Insufficient documentation

## 2014-08-02 DIAGNOSIS — F1010 Alcohol abuse, uncomplicated: Secondary | ICD-10-CM | POA: Diagnosis not present

## 2014-08-02 NOTE — Telephone Encounter (Signed)
left voice message to schedule mri appt.

## 2014-08-02 NOTE — Progress Notes (Signed)
[facility]  PATIENT: Kara [name] DOB: 12-13-1985  REFERRING DOCTOR OR PCP:  Gildardo Cranker SOURCE: Patient and EMR / ED records.   Reviewed CT images  _________________________________   HISTORICAL  CHIEF COMPLAINT:  Chief Complaint  Patient presents with  . Extremity Weakness    Sts. she has had gi sx, 40 lb wt. loss over the last few mos. 2 wks ago she had a syncopal episode at home and was hopitalized at [facility].  She was dx. with gastroenteritis and is seeing Dr. Dorena Cookey for tx. of that.  She is here today with c/o constant numbness bilat thumbs, index and middle fingers.  Sts. 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  Onset 2 weeks ago, upon d/c from hospital.  She also c/o weakness bilat legs, feels like knees  . Numbness    are going to buckle under her, making walking difficult.  Also edema to bilat hands and feet, intermittent bruising to knuckles on both hands.  Onset of all sx. 2 weeks ago, just after d/c from  hospital./fim    HISTORY OF PRESENT ILLNESS:  I had the pleasure seeing your patient, Kara [name], at [facility] for a neurologic consultation regarding her ataxia. She is a 29 year old woman with a history of alcohol abuse who began to notice difficulty with her balance.    In March, she was admitted for gastroenteritis and had noted some difficulty with walking.   After she got home, she began to get swelling in her hands and feet.    Her gait worsened  And she fell several times.  She presented to the emergency room on 07/27/2014 after experiencing several falls over the previous week.    She had an MRI that I personally reviewed showing a normal brain and cervicomedullary junction.    She feels gait is unchanged form last week but she feels a little better in general.     She denies any numbness in her legs but feels that her legs are weak and her knees buckle. No  numb spot in back.    She uses her arms to stand up.   She has muscle aches in her thighs.  Bladder function is unchanged.  She denies any neck pain but has had spasms in her back.    She denies vertigo but notes a swaying sensation when she sits and feels pulled to the right when she sits straight.     She has not had any GI surgery.    Her last fall was one week ago while at work  I personally reviewed the MRI of the brain with and without contrast. It appears normal.    A couple weeks earlier, she had been admitted to the hospital for right upper quadrant pain. CT scan of the abdomen was unremarkable. Ultrasound of the abdomen was also unremarkable.   She has had loss of appetite x a month and some nausea with vomitting.  She lost  40 pounds.    Over the past week, her GI issues resolved -- now no longer has abdominal pain and appetite is back to normal.   No nausea or vomiting. However her weakness and gait issues have not changed.  She is a recovering alcoholic and used to drink several shots of whiskey nightly.  She does not drink much anymore.    REVIEW OF SYSTEMS: Constitutional: No fevers, chills, sweats, or change  in appetite Eyes: No visual changes, double vision, eye pain Ear, nose and throat: No hearing loss, ear pain, nasal congestion, sore throat Cardiovascular: No chest pain, palpitations Respiratory: No shortness of breath at rest or with exertion.   No wheezes GastrointestinaI: as above Genitourinary: No dysuria, urinary retention or frequency.  No nocturia. Musculoskeletal: No neck pain, back pain Integumentary: No rash, pruritus, skin lesions Neurological: as above Psychiatric: No depression at this time.  Some anxiety Endocrine: No palpitations, diaphoresis, change in appetite, change in weigh or increased thirst Hematologic/Lymphatic: No anemia, purpura, petechiae. Allergic/Immunologic: No itchy/runny eyes, nasal congestion, recent allergic reactions,  rashes  ALLERGIES: Allergies  Allergen Reactions  . Peanuts [Peanut Oil] Itching    Sensitivity     HOME MEDICATIONS:  Current outpatient prescriptions:  .  albuterol (PROVENTIL HFA;VENTOLIN HFA) 108 (90 BASE) MCG/ACT inhaler, Inhale 2 puffs into the lungs every 6 (six) hours as needed for wheezing or shortness of breath (wheezing and sob). , Disp: , Rfl:  .  ALPRAZolam (XANAX) 0.25 MG tablet, Take 1 tablet (0.25 mg total) by mouth 2 (two) times daily as needed for anxiety. (Patient taking differently: Take 0.25 mg by mouth daily. ), Disp: 60 tablet, Rfl: 3 .  chlorhexidine (PERIDEX) 0.12 % solution, Use as directed 10 mLs in the mouth or throat daily., Disp: , Rfl:  .  fexofenadine (ALLEGRA) 180 MG tablet, Take 180 mg by mouth daily as needed for allergies (allergies). , Disp: , Rfl:  .  HYDROcodone-acetaminophen (NORCO) 5-325 MG per tablet, Take 1 tablet by mouth every 6 (six) hours as needed for moderate pain., Disp: 20 tablet, Rfl: 0 .  metoprolol succinate (TOPROL-XL) 25 MG 24 hr tablet, Take 1 tablet (25 mg total) by mouth daily., Disp: 30 tablet, Rfl: 11 .  mometasone-formoterol (DULERA) 200-5 MCG/ACT AERO, Inhale 2 puffs into the lungs 2 (two) times daily., Disp: , Rfl:  .  montelukast (SINGULAIR) 10 MG tablet, Take 10 mg by mouth daily. , Disp: , Rfl:  .  Multiple Vitamin (MULTIVITAMIN WITH MINERALS) TABS tablet, Take 1 tablet by mouth daily., Disp: 30 tablet, Rfl: 0 .  pantoprazole (PROTONIX) 40 MG tablet, Take 1 tablet (40 mg total) by mouth 2 (two) times daily., Disp: 60 tablet, Rfl: 0 .  polyethylene glycol (MIRALAX / GLYCOLAX) packet, Take 17 g by mouth daily as needed., Disp: 14 each, Rfl: 0 .  sucralfate (CARAFATE) 1 GM/10ML suspension, Take 10 mLs (1 g total) by mouth 4 (four) times daily -  with meals and at bedtime., Disp: 420 mL, Rfl: 0  PAST MEDICAL HISTORY: Past Medical History  Diagnosis Date  . Asthma   . GERD (gastroesophageal reflux disease)   . Alcohol abuse    . Environmental allergies   . Headache   . Hypertension   . Syncope and collapse   . Vision abnormalities     PAST SURGICAL HISTORY: Past Surgical History  Procedure Laterality Date  . Tympanostomy tube placement    . Esophagogastroduodenoscopy N/A 07/17/2014    Procedure: ESOPHAGOGASTRODUODENOSCOPY (EGD);  Surgeon: Dorena Cookey, MD;  Location: [facility];  Service: Endoscopy;  Laterality: N/A;    FAMILY HISTORY: Family History  Problem Relation Age of Onset  . Heart failure Mother   . Heart failure Father   . Heart failure Maternal Grandfather     SOCIAL HISTORY:  History   Social History  . Marital Status: Married    Spouse Name: [name]  . Number of Children: 0  .  Years of Education: N/A   Occupational History  . College advisor    Social History Main Topics  . Smoking status: Former Smoker -- 0.50 packs/day for 2 years    Types: Cigarettes    Quit date: 04/08/2012  . Smokeless tobacco: Never Used  . Alcohol Use: 0.0 oz/week    0 Standard drinks or equivalent per week     Comment: ETOH abuse, 8 shots/ day  . Drug Use: No  . Sexual Activity: Yes    Birth Control/ Protection: None   Other Topics Concern  . Not on file   Social History Narrative   Married.     PHYSICAL EXAM  Filed Vitals:   08/02/14 0958  BP: 126/82  Pulse: 94  Resp: 16  Height: 5\' 4"  (1.626 m)  Weight: 208 lb (94.348 kg)    Body mass index is 35.69 kg/(m^2).   General: The patient is well-developed and well-nourished and in no acute distress  Eyes:  Funduscopic exam shows normal optic discs and retinal vessels.  Neck: The neck is supple, no carotid bruits are noted.  The neck is nontender.  Cardiovascular: The heart has a regular rate and rhythm with a normal S1 and S2. There were no murmurs, gallops or rubs. Lungs are clear to auscultation.  Skin: Extremities are without rash.    Has mild ankle edema.  Musculoskeletal:  Back is nontender.   Neurologic  Exam  Mental status: The patient is alert and oriented x 3 at the time of the examination. The patient has apparent normal recent and remote memory, with an apparently normal attention span and concentration ability.   Speech is normal.  Cranial nerves: Extraocular movements are full. Pupils are equal, round, and reactive to light and accomodation.  Visual fields are full.  Facial symmetry is present. There is good facial sensation to soft touch bilaterally.Facial strength is normal.  Trapezius and sternocleidomastoid strength is normal. No dysarthria is noted.  The tongue is midline, and the patient has symmetric elevation of the soft palate. No obvious hearing deficits are noted.  Motor:  Muscle bulk is normal.   Tone is normal. Strength is  5 / 5 in all 4 extremities.   Sensory: Sensory testing is intact to pinprick, soft touch and vibration sensation in all 4 extremities except mild decreased vibration sense in toes  Coordination: Cerebellar testing reveals good finger-nose-finger and heel-to-shin bilaterally.  Gait and station: Station is normal.   Gait is normal. Tandem gait is normal. Romberg is borderline.   Reflexes: Deep tendon reflexes are symmetric and normal bilaterally.   Plantar responses are flexor.    DIAGNOSTIC DATA (LABS, IMAGING, TESTING) - I reviewed patient records, labs, notes, testing and imaging myself where available.  Lab Results  Component Value Date   WBC 8.3 07/27/2014   HGB 12.7 07/27/2014   HCT 39.1 07/27/2014   MCV 96.3 07/27/2014   PLT 390 07/27/2014      Component Value Date/Time   NA 136 07/27/2014 1733   K 4.0 07/27/2014 1733   CL 101 07/27/2014 1733   CO2 23 07/27/2014 1733   GLUCOSE 117* 07/27/2014 1733   BUN 9 07/27/2014 1733   CREATININE 0.75 07/27/2014 1733   CALCIUM 9.0 07/27/2014 1733   PROT 7.0 07/27/2014 1733   ALBUMIN 3.5 07/27/2014 1733   AST 37 07/27/2014 1733   ALT 23 07/27/2014 1733   ALKPHOS 83 07/27/2014 1733   BILITOT  0.7 07/27/2014 1733   GFRNONAA >90  07/27/2014 1733   GFRAA >90 07/27/2014 1733   No results found for: CHOL, HDL, LDLCALC, LDLDIRECT, TRIG, CHOLHDL No results found for: ZOXW9U Lab Results  Component Value Date   VITAMINB12 693 07/27/2014   Lab Results  Component Value Date   TSH 1.303 07/16/2014       ASSESSMENT AND PLAN  Ataxic gait - Plan: ANA w/Reflex, CK, NCV with EMG(electromyography), MR Cervical Spine Wo Contrast  Numbness of foot - Plan: NCV with EMG(electromyography), MR Cervical Spine Wo Contrast  Hand numbness - Plan: ANA w/Reflex, CK, NCV with EMG(electromyography), MR Cervical Spine Wo Contrast  Proximal weakness of limb - Plan: ANA w/Reflex, CK, Myasthenia Gravis Evaluation, NCV with EMG(electromyography), MR Cervical Spine Wo Contrast  Proximal leg weakness  Alcohol abuse   In summary, Yeimi Debnam is a 29 year old woman with a history of alcohol abuse who has proximal leg weakness, myalgias and gait ataxia following a significant gastroenteritis associated with large weight loss. Possibly her symptoms are due to metabolic changes towards gastroenteritis. However, since she has not really improved much in the last week we need to consider other possibilities. Because of the proximal weakness, I will check a creatinine kinase and acetylcholine receptor antibodies to rule out polymyositis and myasthenia gravis. We will also check vasculitis labs. She has numbness in her feet and hands. 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 We will check a nerve conduction study in her evaluate this process and determine if further workup is necessary. Because of the gait ataxia we need to check an MRI of the cervical spine to make sure that there is not a myelopathy or myelitis if she is not better at next follow-up I will also consider paraneoplastic antibodies (anti--YO) and set up physical therapy.  She will  return to see me for the NCV/EMG and call sooner if she has new or worsening symptoms.  Miller Edgington A. Epimenio Foot, MD, PhD 08/02/2014, 10:08 AM Certified in Neurology, Clinical Neurophysiology, Sleep Medicine, Pain Medicine and Neuroimaging  [facility] [address] [place], [place] [address] [phone]

## 2014-08-05 ENCOUNTER — Ambulatory Visit: Payer: BC Managed Care – PPO | Admitting: Cardiovascular Disease

## 2014-08-06 LAB — CK: Total CK: 61 U/L (ref 24–173)

## 2014-08-06 LAB — MYASTHENIA GRAVIS EVALUATION: ANTI-STRIATION ABS: NEGATIVE

## 2014-08-06 LAB — ANA W/REFLEX: Anti Nuclear Antibody(ANA): NEGATIVE

## 2014-08-08 ENCOUNTER — Telehealth: Payer: Self-pay | Admitting: Neurology

## 2014-08-08 ENCOUNTER — Ambulatory Visit: Payer: BC Managed Care – PPO

## 2014-08-08 NOTE — Telephone Encounter (Signed)
LMOM (identified vm) that will see what mri/ncv/emg show; she should call if she has any questions/fim

## 2014-08-08 NOTE — Telephone Encounter (Signed)
Patient wanted to relay to Dr. Epimenio FootSater that she is not doing any better and would like to be set up for physical therapy. She is here to day for an MRI. Best call back is  [phone]

## 2014-08-09 NOTE — Telephone Encounter (Signed)
-----   Message from Asa Lenteichard A Sater, MD sent at 08/08/2014  6:31 PM EDT ----- Labs were normal..   We will let her know results of MRI after it is done

## 2014-08-09 NOTE — Telephone Encounter (Signed)
LMTC./fim 

## 2014-08-12 NOTE — Telephone Encounter (Signed)
Lengthy, pleasant conversation with Kara [name].  She is switching from Avonex to Aubagio due to progression of sx. (gait), despite stability in mri's.  She is concerned over possible se of hair thinning/loss with Aubagion.   She is tearful, sts. she doesn't want to gait to continue to worsen, but also doesn't want to lose her hair or risk decreased liver function, and wonders if she should just stay on Avonex.  She doesn't understand why she is having progression of sx. when there were no new ms lesions present on recent mri. We discussed that sx. can worsen without presence of new lesions, and that RAS recommended med change to prevent further progression of gait disturbance; to preserve as much function as possible.   Per RAS, I have advised thinning, if she has any at all, would most likely be around 3-6 mos, and almost completely resolved by 9 mos.  We have also discussed that if she has se that she is not able to tolerate, RAS can discuss another possible med change. She expressed more comfort with starting Aubagio by the end of our conversation.  I have reminded her that we will watch liver function closely, and that if she is going to start Aubagio tomorrow (she has samples until  her supply is delivered), that first liver panel would be due end of May.  She verbalized understanding of same.  I will give  her a reminder call when it is time for the first set of labwork/fim

## 2014-08-14 ENCOUNTER — Ambulatory Visit (INDEPENDENT_AMBULATORY_CARE_PROVIDER_SITE_OTHER): Payer: BC Managed Care – PPO

## 2014-08-14 DIAGNOSIS — R208 Other disturbances of skin sensation: Secondary | ICD-10-CM | POA: Diagnosis not present

## 2014-08-14 DIAGNOSIS — M6289 Other specified disorders of muscle: Secondary | ICD-10-CM | POA: Diagnosis not present

## 2014-08-14 DIAGNOSIS — R260 Ataxic gait: Secondary | ICD-10-CM | POA: Diagnosis not present

## 2014-08-16 NOTE — Telephone Encounter (Signed)
-----   Message from Richard A Sater, MD sent at 08/16/2014 10:08 AM EDT ----- MRI of the cervical spine showed spinal cord was npormal... She has a little arthritic change at one level (C3C4) but no nerve root compression 

## 2014-08-16 NOTE — Telephone Encounter (Signed)
-----   Message from Richard A Sater, MD sent at 08/08/2014  6:31 PM EDT ----- Labs were normal..   We will let her know results of MRI after it is done 

## 2014-08-16 NOTE — Telephone Encounter (Signed)
Attempted to contact [name] with lab and mri results--vm is full; I am unable to leave a message/fim

## 2014-08-16 NOTE — Telephone Encounter (Signed)
Attempted to contact pt/vm is full and I am unable to leave message/fim

## 2014-08-20 ENCOUNTER — Ambulatory Visit (INDEPENDENT_AMBULATORY_CARE_PROVIDER_SITE_OTHER): Payer: BC Managed Care – PPO | Admitting: Neurology

## 2014-08-20 ENCOUNTER — Ambulatory Visit (INDEPENDENT_AMBULATORY_CARE_PROVIDER_SITE_OTHER): Payer: Self-pay | Admitting: Neurology

## 2014-08-20 ENCOUNTER — Telehealth: Payer: Self-pay | Admitting: *Deleted

## 2014-08-20 DIAGNOSIS — R260 Ataxic gait: Secondary | ICD-10-CM

## 2014-08-20 DIAGNOSIS — Z0289 Encounter for other administrative examinations: Secondary | ICD-10-CM

## 2014-08-20 DIAGNOSIS — R208 Other disturbances of skin sensation: Secondary | ICD-10-CM | POA: Diagnosis not present

## 2014-08-20 DIAGNOSIS — R200 Anesthesia of skin: Secondary | ICD-10-CM

## 2014-08-20 DIAGNOSIS — M6289 Other specified disorders of muscle: Secondary | ICD-10-CM

## 2014-08-20 NOTE — Procedures (Signed)
   NCS (NERVE CONDUCTION STUDY) WITH EMG (ELECTROMYOGRAPHY) REPORT   STUDY DATE: May 3rd 2016 PATIENT NAME: Kara [name] DOB: 07/16/1985 MRN: [id number]    TECHNOLOGIST: Kaylyn LimSue Fox ELECTROMYOGRAPHER: Levert FeinsteinYan, Siennah Barrasso M.D.  CLINICAL INFORMATION:  29 year old female, presenting with bilateral hands, feet paresthesia, gait difficulty since March 2016  On examination: She is anxious, there was no significant upper lower extremity proximal and distal muscle weakness. Sensory examination was intact to light touch, vibratory sensation, and proprioception at toes and fingers, but she has allodynia at bilateral feet, and hands, absent reflexes bilaterally.  FINDINGS: NERVE CONDUCTION STUDY: Bilateral peroneal, sural sensory responses were absent. Bilateral radial, ulnar, medial sensory responses were present, with drop past the snap amplitude, but mild to moderately prolonged peak latency.  Left peroneal to EDB, left tibial motor responses were normal. Left ulnar, median motor responses were normal.  NEEDLE ELECTROMYOGRAPHY: Selective needle examination was performed at left lower extremity muscles, left lumbosacral paraspinals, left upper extremity muscles, left cervical paraspinal muscles.  Needle examination of left tibialis anterior, tibialis posterior, medial gastrocnemius,  vastus lateralis was normal.  There was no spontaneous activity at left lumbosacral paraspinals, left L4, L5, S1.  Selected needle examination of left upper extremity muscles, left extensor digitorum communis, biceps, triceps, deltoid was normal.  There was no spontaneous activity at left cervical paraspinals, left C5 C6, C7.  IMPRESSION:  This is a mild abnormal study. 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 There was no evidence of inflammatory myopathy, there was no evidence of acquired demyelinating neuropathy.   INTERPRETING PHYSICIAN:   Levert FeinsteinYan, Fardeen Steinberger M.D.  Ph.D. [facility] [address], [address] [place], [place] [id number] [phone]

## 2014-08-20 NOTE — Telephone Encounter (Signed)
I have spoken with Kara [name] and per RAS, advised her of lab and mri results/fim

## 2014-08-20 NOTE — Telephone Encounter (Signed)
-----   Message from Asa Lenteichard A Sater, MD sent at 08/16/2014 10:08 AM EDT ----- MRI of the cervical spine showed spinal cord was npormal... She has a little arthritic change at one level (C3C4) but no nerve root compression

## 2014-08-20 NOTE — Telephone Encounter (Signed)
I have spoken with Kara [name] today and per RAS, advised that labwork was normal, and that mri of neck was normal--just showed some wear and tear arthritic changes, but these changes are not causing any nerve root compression.  Kara [name] verbalized understanding of same/fim

## 2014-08-20 NOTE — Telephone Encounter (Signed)
-----   Message from Richard A Sater, MD sent at 08/08/2014  6:31 PM EDT ----- Labs were normal..   We will let her know results of MRI after it is done 

## 2014-08-20 NOTE — Telephone Encounter (Signed)
Spoke with Marchelle FolksAmanda and per RAS, advised that labwork was normal, and that mri c-spine only showed some arthritic changes, but these changes are not causing any nerve root compression.  Marchelle Folksmanda verbalized understanding of same.  She has ncs sched. for 2pm today and will see what that shows/fim

## 2014-08-20 NOTE — Progress Notes (Signed)
See separate electrodiagnostic study today

## 2014-08-26 ENCOUNTER — Telehealth: Payer: Self-pay | Admitting: Neurology

## 2014-08-26 DIAGNOSIS — G629 Polyneuropathy, unspecified: Secondary | ICD-10-CM

## 2014-08-26 NOTE — Telephone Encounter (Signed)
Patient is calling to get the results of her NCV test.  Please call.

## 2014-08-27 ENCOUNTER — Telehealth: Payer: Self-pay | Admitting: Neurology

## 2014-08-27 MED ORDER — TRAMADOL HCL 50 MG PO TABS: 50.0000 mg | ORAL_TABLET | Freq: Three times a day (TID) | ORAL | Status: DC | PRN

## 2014-08-27 NOTE — Telephone Encounter (Signed)
Spoke to patient - she is aware of results - still weak and would like PT - orders placed today.  She is also requesting rx for pain medication.  Tramadol has helped in the past.

## 2014-08-27 NOTE — Telephone Encounter (Signed)
I called twice at [phone], failed to reach patient, could not leave a message, failed to reach patient's by [phone] as well

## 2014-08-27 NOTE — Telephone Encounter (Signed)
NCV/EMG shows mild polyneuropathy -- if still feeling weak set up PT.   If she feels she is worsening further, let me know

## 2014-08-27 NOTE — Telephone Encounter (Signed)
I have spoken with Marchelle FolksAmanda, and per RAS, offered Tamadol 50mg  po tid prn pain.  She is agreeable with this--Rx. printed, signed, up front [facility]/fim

## 2014-08-27 NOTE — Telephone Encounter (Signed)
Patient returned call. Please call and advise.  °

## 2014-08-27 NOTE — Addendum Note (Signed)
Addended by: Candis SchatzMISENHEIMER, Jervon Ream I on: 08/27/2014 02:14 PM   Modules accepted: Orders

## 2014-08-27 NOTE — Addendum Note (Signed)
Addended by: Lindell SparKIRKMAN, MICHELLE C on: 08/27/2014 01:40 PM   Modules accepted: Orders

## 2014-08-30 ENCOUNTER — Ambulatory Visit: Payer: BC Managed Care – PPO | Attending: Neurology | Admitting: Physical Therapy

## 2014-08-30 DIAGNOSIS — R6889 Other general symptoms and signs: Secondary | ICD-10-CM | POA: Diagnosis not present

## 2014-08-30 DIAGNOSIS — R293 Abnormal posture: Secondary | ICD-10-CM | POA: Diagnosis not present

## 2014-08-30 DIAGNOSIS — M25673 Stiffness of unspecified ankle, not elsewhere classified: Secondary | ICD-10-CM

## 2014-08-30 DIAGNOSIS — R279 Unspecified lack of coordination: Secondary | ICD-10-CM | POA: Insufficient documentation

## 2014-08-30 DIAGNOSIS — M6281 Muscle weakness (generalized): Secondary | ICD-10-CM | POA: Diagnosis present

## 2014-08-30 DIAGNOSIS — R269 Unspecified abnormalities of gait and mobility: Secondary | ICD-10-CM | POA: Insufficient documentation

## 2014-08-30 DIAGNOSIS — R29898 Other symptoms and signs involving the musculoskeletal system: Secondary | ICD-10-CM | POA: Insufficient documentation

## 2014-08-30 NOTE — Therapy (Signed)
[facility] [address] [place], [place], [address] Phone: [phone]   Fax:  [phone]  Physical Therapy Evaluation  Patient Details  Name: Kara [name] MRN: [id number] Date of Birth: 11/15/1985 Referring Provider:  Asa LenteSater, Richard A, MD  Encounter Date: 08/30/2014      PT End of Session - 08/30/14 1258    Visit Number 1   Number of Visits 9   PT Start Time 1019   PT Stop Time 1112   PT Time Calculation (min) 53 min   Equipment Utilized During Treatment Gait belt   Activity Tolerance Patient tolerated treatment well      Past Medical History  Diagnosis Date  . Asthma   . GERD (gastroesophageal reflux disease)   . Alcohol abuse   . Environmental allergies   . Headache   . Hypertension   . Syncope and collapse   . Vision abnormalities     Past Surgical History  Procedure Laterality Date  . Tympanostomy tube placement    . Esophagogastroduodenoscopy N/A 07/17/2014    Procedure: ESOPHAGOGASTRODUODENOSCOPY (EGD);  Surgeon: Dorena CookeyJohn Hayes, MD;  [facility];  Service: Endoscopy;  Laterality: N/A;    There were no vitals filed for this visit.  Visit Diagnosis:  Muscle weakness (generalized)  Decreased range of motion of ankle  Abnormality of gait  Postural instability      Subjective Assessment - 08/30/14 1024    Subjective Pt is a 29 year old who presents to OP PT status post fall approximately 7 weeks ago.  She has had stomach issues since December, with gastritis.  She had a fall in late March, does not remember what happened with her fall.  She is currently having  swelling in bilateral lower extremities and in hands.  She is noticing weakness in thighs, stiffening of upper body due to fear of falling.  She feels that he legs will give way.  She has had at least 6 falls in the past 6 weeks.  She is also noting pain to touch on fingers and hands as well as feet.  She is having difficulty  buttoning, typing, with ADL and fine-motor tasks, requiring mother's assistance.  She has not been able to use cane or walker due to difficulty gripping the device.   Patient is accompained by: Family member  mother   Patient Stated Goals Pt would like to get back to being independent again.   Currently in Pain? Yes   Pain Score 4    Pain Location Neck  low back, L foot   Pain Orientation Left  left neck, left low back   Pain Descriptors / Indicators Sharp;Aching;Spasm  sharp neck pain, spasm in foot   Pain Type Acute pain   Pain Onset More than a month ago   Pain Frequency Constant   Aggravating Factors  sensitive to touch   Pain Relieving Factors ice pack, pain patches            [facility] PT Assessment - 08/30/14 1039    Assessment   Medical Diagnosis polyneuropathy   Onset Date --  within past 2-3 months   Precautions   Precautions Fall  No driving   Balance Screen   Has the patient fallen in the past 6 months Yes   How many times? 6  at least   Has the patient had a decrease in activity level because of a fear of falling?  Yes   Is the patient reluctant to  leave their home because of a fear of falling?  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; seated activities and typing included   Leisure enjoys movies, shopping   Observation/Other Assessments   Focus on Therapeutic Outcomes (FOTO)  Functional Status intake measure:   29; Activities of Balance confidence scale 21.3%   Sensation   Additional Comments pt reports pain to touch fingers, hands and feet   ROM / Strength   AROM / PROM / Strength PROM;Strength   PROM   Overall PROM  Unable to assess;Due to pain   Overall PROM Comments pt limited in dorsiflexion bilaterally due to pain and swelling; with P/ROM plantarflexion, pt noted to have extra downbeating fasiculations into plantarflexion after ROM completed   Strength   Overall Strength --  dorsiflexion testing limited due to edema   Overall Strength Comments bilateral hip flexion and knee flex/extension grossly tested 4/5  ankle dorsiflexion 3-/5 bilaterally   Transfers   Transfers Sit to Stand;Stand to Sit  increased lean to R upon sit>stand transfer   Sit to Stand 6: Modified independent (Device/Increase time);From chair/3-in-1;With armrests;With upper extremity assist;Other/comment  pushes up through chair with forearms not hands   Stand to Sit 6: Modified independent (Device/Increase time);With upper extremity assist;With armrests;To chair/3-in-1   Ambulation/Gait   Ambulation/Gait Yes   Ambulation/Gait Assistance 4: Min guard   Ambulation Distance (Feet) 60 Feet   Assistive device None   Gait Pattern Step-through pattern;Decreased step length - right;Decreased step length - left;Decreased dorsiflexion - right;Decreased dorsiflexion - left;Right foot flat;Left foot flat;Ataxic;Wide base of support;Poor foot clearance - left;Poor foot clearance - right  holds arms postureed in mid-guard with gait   Ambulation Surface Level;Indoor   Gait velocity 29.54 sec=1.10 ft/sec   Gait Comments Pt ambulates with arms postured in high guard with wrists/hands held in flexion with RUE tremor noted, with decreased step length and foot clearance, wide base of support, decreased trunk rotation, with increased inversion and supination noted at ankles.   Balance   Balance Assessed Yes   Standardized Balance Assessment    Standardized Balance Assessment Berg Balance Test  Unable to complete Berg due to tremors/shaking in standing   Berg Balance Test   Sit to Stand Needs minimal aid to stand or to stabilize   Standing Unsupported Able to stand 30 seconds unsupported   Sitting with Back Unsupported but Feet Supported on Floor or Stool Able to sit safely and securely 2 minutes   Stand to Sit Sits independently, has uncontrolled descent   Transfers Able to transfer with verbal cueing and /or supervision   From Standing, Reach Forward with Outstretched Arm Loses balance while trying/requires external support                           PT Education - 08/30/14 1257    Education provided Yes   Education Details gentle ankle ROM with  feet elevated, continue supervision of family due to fall risk; frequent rest breaks with gait.  Encouraged continued follow-up with neurologist if symptoms persit.   Person(s) Educated Patient;Parent(s)   Methods Explanation;Demonstration   Comprehension Verbalized understanding             PT Long Term Goals - 08/30/14 1307    PT LONG TERM GOAL #1   Title Pt will be independent with HEP for improved functional mobility, strength, flexibility and balance.   Time 4   Period Weeks   Status New   PT LONG TERM GOAL #2   Title Pt will improve TUG score to less than or equal to 20 seconds for decreased fall risk.   Time 4   Period Weeks   Status New   PT LONG TERM GOAL #3   Title Pt will improve gait velocity to at least 1.8 ft/sec for improved gait efficiency and decreased fall risk.   Time 4   Period Weeks   PT LONG TERM GOAL #4   Title Pt will verbalize understanding of fall prevention within home environment.   Time 4   Period Weeks   Status New   PT LONG TERM GOAL #5   Title Pt will verbalize understanding and fitting for potential equipment needs to assist with functional mobility.   Time 4   Period Weeks   Status New                Plan - 08/30/14 1259    Clinical Impression Statement Pt is a 29 year old female who presents to PT with diagnosis of polyneuropathy.  Patient/mother describe periods of fluctuating mobility, with today (eval) being a better day.  Pt has difficulty with sit<>stand transfers, using forearms to push up from chair versus hands.  She has significant edema noted in bilateral hands and feet.  She holds feet in supinated, inverted position and holds hands and wrists in flexed position.  With gait, pt has posturing of arms into mid-guard position, with wide base of support and decreased foot clearance.  Pt is at high risk of falling per Timed UP and Go and gait velocity scores.  She has had 6 falls in 6 weeks.  Pt would benefit from physical therapy to address stretching, strengthening, balance and gait training as well as equipment needs for optimal safety with functional mobility.  Pt would also benefit from complete neurological work-up, as patient's presentation and significant functional limitations do not fully match polyneuropathy diagnosis.   Pt will benefit from skilled therapeutic intervention in order to improve on the following deficits Abnormal gait;Decreased activity tolerance;Decreased balance;Decreased mobility;Decreased strength;Decreased endurance;Decreased range of motion;Difficulty walking;Increased edema;Impaired UE functional use;Impaired flexibility   Rehab Potential Good   PT Frequency 2x / week   PT Duration 4 weeks  plus eval   PT Treatment/Interventions ADLs/Self Care Home Management;Functional mobility training;Gait training;Therapeutic activities;Therapeutic exercise;Balance training;Neuromuscular re-education;Patient/family education   PT Next Visit Plan Initiate HEP for gentle stretching and ROM, strengthening of lower extremities   Recommended Other Services Occupational therapy evaluation due to decreased fine motor coordination and decreased participation in ADLS   Consulted and  Agree with Plan of Care Patient;Family member/caregiver   Family Member Consulted mother         Problem List Patient Active Problem List   Diagnosis Date Noted  . Numbness of foot 08/02/2014  . Hand numbness 08/02/2014  . Ataxic gait 08/02/2014  . Proximal leg weakness 08/02/2014  . Constipation   .  Intractable abdominal pain 07/16/2014  . Hypothermia 07/16/2014  . Upper abdominal pain   . Nausea with vomiting 07/06/2014  . Abdominal pain, epigastric 07/06/2014  . Obesity (BMI 30-39.9) 07/06/2014  . Chronic diastolic heart failure 07/06/2014  . Syncope 04/08/2014  . Prolonged Q-T interval on ECG 04/08/2014  . Hypokalemia 04/08/2014  . Alcohol abuse 04/08/2014  . Elevated LFTs 04/08/2014    Gram Siedlecki W. 08/30/2014, 1:15 PM  Gean Maidens., PT  [facility] [address] [place], [place], [address] Phone: [phone]   Fax:  [phone]

## 2014-09-02 ENCOUNTER — Encounter: Payer: Self-pay | Admitting: Neurology

## 2014-09-02 ENCOUNTER — Ambulatory Visit (INDEPENDENT_AMBULATORY_CARE_PROVIDER_SITE_OTHER): Payer: BC Managed Care – PPO | Admitting: Neurology

## 2014-09-02 VITALS — BP 144/84 | HR 82 | Resp 14 | Ht 64.0 in | Wt 206.8 lb

## 2014-09-02 DIAGNOSIS — F1011 Alcohol abuse, in remission: Secondary | ICD-10-CM | POA: Insufficient documentation

## 2014-09-02 DIAGNOSIS — M542 Cervicalgia: Secondary | ICD-10-CM

## 2014-09-02 DIAGNOSIS — G629 Polyneuropathy, unspecified: Secondary | ICD-10-CM | POA: Diagnosis not present

## 2014-09-02 DIAGNOSIS — R609 Edema, unspecified: Secondary | ICD-10-CM

## 2014-09-02 DIAGNOSIS — G8929 Other chronic pain: Secondary | ICD-10-CM | POA: Diagnosis not present

## 2014-09-02 DIAGNOSIS — F1010 Alcohol abuse, uncomplicated: Secondary | ICD-10-CM | POA: Diagnosis not present

## 2014-09-02 DIAGNOSIS — R260 Ataxic gait: Secondary | ICD-10-CM | POA: Diagnosis not present

## 2014-09-02 DIAGNOSIS — M545 Low back pain, unspecified: Secondary | ICD-10-CM

## 2014-09-02 MED ORDER — GABAPENTIN 300 MG PO CAPS: ORAL_CAPSULE | ORAL | Status: DC

## 2014-09-02 NOTE — Progress Notes (Signed)
 [facility]  PATIENT: Kara [name] DOB: 12/30/1985  REFERRING DOCTOR OR PCP:  Charles Ross SOURCE: Patient and EMR / ED records.   Reviewed CT images  _________________________________   HISTORICAL  CHIEF COMPLAINT:  Chief Complaint  Patient presents with  . Gait Problem    Sts. gait ataxia is about the same--sts. she has good days and bad.  Her mother is with her today and would like to discuss mri results, and edema in bilat feet./fim    HISTORY OF PRESENT ILLNESS:  Kara [name] is a 28-year-old woman with a history of alcohol abuse who began to notice difficulty with her balance and walking in March, 2016 after a severe gastroenteritis hospitalization.   After she got home, she began to note swelling in her hands and feet and her gait worsened, falling several times.    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 There is right greater than left foraminal narrowing. There is no spinal cord abnormality noted.   MRI of the  brain was normal.     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    Since the last visit, she continues to have a lot of swelling in her feet.   She continues to have neck and back pain.  She feels her gait is doing better the past week.        She has had loss of appetite x a month and some nausea with vomitting.  She lost  40 pounds.   She currently has no GI symptoms and feels she is eating well.     No nausea or vomiting.   She continues to report mild to moderate back more than neck pain.  She is a recovering alcoholic and used to drink several shots of whiskey nightly.  She does not drink much anymore.    REVIEW OF SYSTEMS: Constitutional: No fevers, chills, sweats, or change in appetite Eyes: No visual changes, double vision, eye pain Ear, nose and throat: No hearing loss, ear pain, nasal congestion, sore throat Cardiovascular: No chest pain, palpitations Respiratory: No  shortness of breath at rest or with exertion.   No wheezes GastrointestinaI: as above Genitourinary: No dysuria, urinary retention or frequency.  No nocturia. Musculoskeletal: No neck pain, back pain Integumentary: No rash, pruritus, skin lesions Neurological: as above Psychiatric: No depression at this time.  Some anxiety Endocrine: No palpitations, diaphoresis, change in appetite, change in weigh or increased thirst Hematologic/Lymphatic: No anemia, purpura, petechiae. Allergic/Immunologic: No itchy/runny eyes, nasal congestion, recent allergic reactions, rashes  ALLERGIES: Allergies  Allergen Reactions  . Peanuts [Peanut Oil] Itching    Sensitivity     HOME MEDICATIONS:  Current outpatient prescriptions:  .  albuterol (PROVENTIL HFA;VENTOLIN HFA) 108 (90 BASE) MCG/ACT inhaler, Inhale 2 puffs into the lungs every 6 (six) hours as needed for wheezing or shortness of breath (wheezing and sob). , Disp: , Rfl:  .  ALPRAZolam (XANAX) 0.25 MG tablet, Take 1 tablet (0.25 mg total) by mouth 2 (two) times daily as needed for anxiety. (Patient taking differently: Take 0.25 mg by mouth daily. ), Disp: 60 tablet, Rfl: 3 .  chlorhexidine (PERIDEX) 0.12 % solution, Use as directed 10 mLs in the mouth or throat daily., Disp: , Rfl:  .  fexofenadine (ALLEGRA) 180 MG tablet, Take 180 mg by mouth daily as needed for allergies (allergies). , Disp: , Rfl:  .  metoprolol succinate (TOPROL-XL) 25 MG 24 hr tablet,   Take 1 tablet (25 mg total) by mouth daily., Disp: 30 tablet, Rfl: 11 .  mometasone-formoterol (DULERA) 200-5 MCG/ACT AERO, Inhale 2 puffs into the lungs 2 (two) times daily., Disp: , Rfl:  .  montelukast (SINGULAIR) 10 MG tablet, Take 10 mg by mouth daily. , Disp: , Rfl:  .  Multiple Vitamin (MULTIVITAMIN WITH MINERALS) TABS tablet, Take 1 tablet by mouth daily., Disp: 30 tablet, Rfl: 0 .  pantoprazole (PROTONIX) 40 MG tablet, Take 1 tablet (40 mg total) by mouth 2 (two) times daily., Disp: 60  tablet, Rfl: 0 .  polyethylene glycol (MIRALAX / GLYCOLAX) packet, Take 17 g by mouth daily as needed., Disp: 14 each, Rfl: 0 .  sucralfate (CARAFATE) 1 GM/10ML suspension, Take 10 mLs (1 g total) by mouth 4 (four) times daily -  with meals and at bedtime., Disp: 420 mL, Rfl: 0 .  traMADol (ULTRAM) 50 MG tablet, Take 1 tablet (50 mg total) by mouth 3 (three) times daily as needed., Disp: 90 tablet, Rfl: 0  PAST MEDICAL HISTORY: Past Medical History  Diagnosis Date  . Asthma   . GERD (gastroesophageal reflux disease)   . Alcohol abuse   . Environmental allergies   . Headache   . Hypertension   . Syncope and collapse   . Vision abnormalities     PAST SURGICAL HISTORY: Past Surgical History  Procedure Laterality Date  . Tympanostomy tube placement    . Esophagogastroduodenoscopy N/A 07/17/2014    Procedure: ESOPHAGOGASTRODUODENOSCOPY (EGD);  Surgeon: Teena Irani, MD;  Location: [facility];  Service: Endoscopy;  Laterality: N/A;    FAMILY HISTORY: Family History  Problem Relation Age of Onset  . Heart failure Mother   . Heart failure Father   . Heart failure Maternal Grandfather     SOCIAL HISTORY:  History   Social History  . Marital Status: Married    Spouse Name: Winferd Humphrey  . Number of Children: 0  . Years of Education: N/A   Occupational History  . College advisor    Social History Main Topics  . Smoking status: Former Smoker -- 0.50 packs/day for 2 years    Types: Cigarettes    Quit date: 04/08/2012  . Smokeless tobacco: Never Used  . Alcohol Use: 0.0 oz/week    0 Standard drinks or equivalent per week     Comment: ETOH abuse, 8 shots/ day  . Drug Use: No  . Sexual Activity: Yes    Birth Control/ Protection: None   Other Topics Concern  . Not on file   Social History Narrative   Married.     PHYSICAL EXAM  Filed Vitals:   09/02/14 1406  BP: 144/84  Pulse: 82  Resp: 14  Height: 5' 4" (1.626 m)  Weight: 206 lb 12.8 oz (93.804 kg)    Body  mass index is 35.48 kg/(m^2).   General: The patient is well-developed and well-nourished and in no acute distress  Eyes:  Funduscopic exam shows normal optic discs and retinal vessels.  Neck: The neck is supple, no carotid bruits are noted.  The neck is nontender.  Musculoskeletal:  Back is nontender.   Neurologic Exam  Mental status: The patient is alert and oriented x 3 at the time of the examination. The patient has apparent normal recent and remote memory, with an apparently normal attention span and concentration ability.   Speech is normal.  Cranial nerves: Extraocular movements are full.  Facial symmetry is present. There is good facial sensation to soft  touch bilaterally.Facial strength is normal.  Trapezius and sternocleidomastoid strength is normal. No dysarthria is noted.  The tongue is midline, and the patient has symmetric elevation of the soft palate. No obvious hearing deficits are noted.  Motor:  Muscle bulk is normal.   Tone is normal. Strength is  5 / 5 in all 4 extremities.   Sensory: Sensory testing is intact to pinprick, soft touch and vibration sensation in arms but in feet, there is  mild decreased vibration sense  Coordination: Cerebellar testing reveals good finger-nose-finger and heel-to-shin bilaterally.  Gait and station: Station is normal.   Gait is near normal. Tandem gait is mildly wide. Romberg is borderline.   Reflexes: Deep tendon reflexes are symmetric and normal bilaterally.      DIAGNOSTIC DATA (LABS, IMAGING, TESTING) - I reviewed patient records, labs, notes, testing and imaging myself where available.  Lab Results  Component Value Date   WBC 8.3 07/27/2014   HGB 12.7 07/27/2014   HCT 39.1 07/27/2014   MCV 96.3 07/27/2014   PLT 390 07/27/2014      Component Value Date/Time   NA 136 07/27/2014 1733   K 4.0 07/27/2014 1733   CL 101 07/27/2014 1733   CO2 23 07/27/2014 1733   GLUCOSE 117* 07/27/2014 1733   BUN 9 07/27/2014 1733    CREATININE 0.75 07/27/2014 1733   CALCIUM 9.0 07/27/2014 1733   PROT 7.0 07/27/2014 1733   ALBUMIN 3.5 07/27/2014 1733   AST 37 07/27/2014 1733   ALT 23 07/27/2014 1733   ALKPHOS 83 07/27/2014 1733   BILITOT 0.7 07/27/2014 1733   GFRNONAA >90 07/27/2014 1733   GFRAA >90 07/27/2014 1733   No results found for: CHOL, HDL, LDLCALC, LDLDIRECT, TRIG, CHOLHDL No results found for: HGBA1C Lab Results  Component Value Date   VITAMINB12 693 07/27/2014   Lab Results  Component Value Date   TSH 1.303 07/16/2014       ASSESSMENT AND PLAN  Polyneuropathy - Plan: Vitamin B12, Multiple myeloma panel, serum, Comprehensive metabolic panel, Ambulatory referral to Occupational Therapy  Ataxic gait  History of alcohol abuse  Edema  Neck pain  Chronic lower back pain   1.   Check B12, SPEP/IEF, CMP, anti-Hu 2.   referral to OT 3.   She is improving. She is advised to try to stay as active as she can and exercises as tolerated. 4. Return to clinic in 2-3 months   A. , MD, PhD 09/02/2014, 2:29 PM Certified in Neurology, Clinical Neurophysiology, Sleep Medicine, Pain Medicine and Neuroimaging  [facility] [address], [address] [place], [place] [address] [phone]   

## 2014-09-03 ENCOUNTER — Ambulatory Visit: Payer: BC Managed Care – PPO | Admitting: Physical Therapy

## 2014-09-03 DIAGNOSIS — M6281 Muscle weakness (generalized): Secondary | ICD-10-CM | POA: Diagnosis not present

## 2014-09-03 DIAGNOSIS — M25673 Stiffness of unspecified ankle, not elsewhere classified: Secondary | ICD-10-CM

## 2014-09-03 NOTE — Therapy (Signed)
[facility] [address] [place], [place], [address] Phone: [phone]   Fax:  [phone]  Physical Therapy Treatment  Patient Details  Name: Kara [name] MRN: [id number] Date of Birth: 02/28/1986 Referring Provider:  Gildardo Crankeross, Charles, MD  Encounter Date: 09/03/2014      PT End of Session - 09/03/14 2226    Visit Number 2   Number of Visits 9   PT Start Time 1155   PT Stop Time 1233   PT Time Calculation (min) 38 min   Activity Tolerance Patient tolerated treatment well   Behavior During Therapy [facility] for tasks assessed/performed      Past Medical History  Diagnosis Date  . Asthma   . GERD (gastroesophageal reflux disease)   . Alcohol abuse   . Environmental allergies   . Headache   . Hypertension   . Syncope and collapse   . Vision abnormalities     Past Surgical History  Procedure Laterality Date  . Tympanostomy tube placement    . Esophagogastroduodenoscopy N/A 07/17/2014    Procedure: ESOPHAGOGASTRODUODENOSCOPY (EGD);  Surgeon: Dorena CookeyJohn Hayes, MD;  [facility];  Service: Endoscopy;  Laterality: N/A;    There were no vitals filed for this visit.  Visit Diagnosis:  Muscle weakness (generalized)  Decreased range of motion of ankle      Subjective Assessment - 09/03/14 1157    Subjective Saw Dr. Epimenio FootSater yesterday and he started on Gabapentin for some nerve pain.  Seems to be helping.  Less swelling in feet and ankles today.   Currently in Pain? Yes   Pain Score 4    Pain Location Neck  low back   Pain Orientation Right   Pain Frequency Constant   Aggravating Factors  odd movement patterns   Pain Relieving Factors positioning during sleeping alleviates pain      Therapeutic Exercise: -Bilateral Ankle pumps in supine x 10 reps -Ankle circles clockwise and counterclockwise 10 reps  -Ankle alphabet each leg, all for improved active ROM bilateral ankles   -SAQs bilateral x 10 reps -Heel  slides x 10 reps bilaterally -Hip abduction x 10 reps bilaterally  -Seated ankle pumps x 10, seated ankle eversion x 10 reps, then seated toe curls x 10 reps each leg.                              PT Education - 09/03/14 2225    Education provided Yes   Education Details HEP-lower extremity exercises; discussed/demonstrated to mother and patient easier way to don compression stockings when she gets them   Person(s) Educated Patient;Spouse   Methods Demonstration;Explanation;Handout   Comprehension Returned demonstration             PT Long Term Goals - 08/30/14 1307    PT LONG TERM GOAL #1   Title Pt will be independent with HEP for improved functional mobility, strength, flexibility and balance.   Time 4   Period Weeks   Status New   PT LONG TERM GOAL #2   Title Pt will improve TUG score to less than or equal to 20 seconds for decreased fall risk.   Time 4   Period Weeks   Status New   PT LONG TERM GOAL #3   Title Pt will improve gait velocity to at least 1.8 ft/sec for improved gait efficiency and decreased fall risk.   Time 4   Period Weeks  PT LONG TERM GOAL #4   Title Pt will verbalize understanding of fall prevention within home environment.   Time 4   Period Weeks   Status New   PT LONG TERM GOAL #5   Title Pt will verbalize understanding and fitting for potential equipment needs to assist with functional mobility.   Time 4   Period Weeks   Status New               Plan - 09/03/14 2227    Clinical Impression Statement HEP intiated for lower extremity ROM and strengthening this visit.  Pt reports Gabapentin started with some pain relief.   Pt will benefit from skilled therapeutic intervention in order to improve on the following deficits Abnormal gait;Decreased activity tolerance;Decreased balance;Decreased mobility;Decreased strength;Decreased endurance;Decreased range of motion;Difficulty walking;Increased edema;Impaired UE  functional use;Impaired flexibility   Rehab Potential Good   PT Frequency 2x / week   PT Duration 4 weeks  wk 1 of 4   PT Treatment/Interventions ADLs/Self Care Home Management;Functional mobility training;Gait training;Therapeutic activities;Therapeutic exercise;Balance training;Neuromuscular re-education;Patient/family education   PT Next Visit Plan Review HEP; continue strengthening, gentle stretching; gait activities/standing balance   Consulted and Agree with Plan of Care Patient;Family member/caregiver   Family Member Consulted mother        Problem List Patient Active Problem List   Diagnosis Date Noted  . Polyneuropathy 09/02/2014  . History of alcohol abuse 09/02/2014  . Edema 09/02/2014  . Neck pain 09/02/2014  . Chronic lower back pain 09/02/2014  . Numbness of foot 08/02/2014  . Hand numbness 08/02/2014  . Ataxic gait 08/02/2014  . Proximal leg weakness 08/02/2014  . Constipation   . Intractable abdominal pain 07/16/2014  . Hypothermia 07/16/2014  . Upper abdominal pain   . Nausea with vomiting 07/06/2014  . Abdominal pain, epigastric 07/06/2014  . Obesity (BMI 30-39.9) 07/06/2014  . Chronic diastolic heart failure 07/06/2014  . Syncope 04/08/2014  . Prolonged Q-T interval on ECG 04/08/2014  . Hypokalemia 04/08/2014  . Alcohol abuse 04/08/2014  . Elevated LFTs 04/08/2014    Kara [name] W. 09/03/2014, 10:29 PM  [facility] [address] [place], [place], [address] Phone: [phone]   Fax:  [phone]

## 2014-09-03 NOTE — Patient Instructions (Addendum)
ANKLE: Pumps   Point toes down, then up. _10__ reps per set, __2_ sets.  Perform 1-2 times per day.  Next, you will want to perform ankle circles in clockwise and counterclockwise direction.  10 reps, work up to 2 sets, 1-2 times per day.   Copyright  VHI. All rights reserved.  Ankle Alphabet   Using left ankle and foot only, trace the letters of the alphabet. Perform A to Z, each leg Do _1-2___ sessions per day.  http://orth.exer.us/17   Copyright  VHI. All rights reserved.  KNEE: Extension, Short Arc Quads - Supine   Place bolster under knees. Raise one leg until knee is straight. _10__ reps per set, __work up to 2 sets_ .  Perform 1-2 times per day.   Copyright  VHI. All rights reserved.  Heel Slides    Slide left heel along bed towards bottom. Hold for _3__ seconds. Slide back to flat knee position. Repeat _10__ times. Do _1-2__ times a day. Repeat with other leg.    Copyright  VHI. All rights reserved.  ABDUCTION: Supine (Active)   Lie on back, legs straight. Slide right leg out to the side as far as possible.  Complete _2__ sets of _10__ repetitions. Perform _1-2__ sessions per day.  Copyright  VHI. All rights reserved.  ANKLE: Eversion, Unilateral   Sit at edge of surface, feet on floor. You can have a pillow case under the top half of your foot.Raise toes of foot up and move away from body, pushing away the pillow case.. Do not move hip or knee. _10-20__ reps per set, _1-2__ sets per day.  TOE CURLS -With the top half of your foot on the pillow case, keep your heel on the ground and scrunch your toes to "grab" the pillow case, the let go.  Repeat 10-20 times each leg.  Copyright  VHI. All rights reserved.

## 2014-09-04 ENCOUNTER — Ambulatory Visit: Payer: BC Managed Care – PPO | Admitting: Occupational Therapy

## 2014-09-04 DIAGNOSIS — M6281 Muscle weakness (generalized): Secondary | ICD-10-CM

## 2014-09-04 DIAGNOSIS — R293 Abnormal posture: Secondary | ICD-10-CM

## 2014-09-04 DIAGNOSIS — R6889 Other general symptoms and signs: Secondary | ICD-10-CM

## 2014-09-04 DIAGNOSIS — R278 Other lack of coordination: Secondary | ICD-10-CM

## 2014-09-04 DIAGNOSIS — R279 Unspecified lack of coordination: Secondary | ICD-10-CM

## 2014-09-04 LAB — PARANEOPLASTIC PROFILE II
Neuronal Nuc Ab (Ri), IFA: <1:10 {titer}
Neuronal Nuclear (Hu) Antibody (IB): <1:10 {titer}

## 2014-09-04 LAB — COMPREHENSIVE METABOLIC PANEL
A/G RATIO: 1.2 (ref 1.1–2.5)
ALK PHOS: 114 IU/L (ref 39–117)
ALT: 45 IU/L — ABNORMAL HIGH (ref 0–32)
AST: 48 IU/L — ABNORMAL HIGH (ref 0–40)
Albumin: 4.1 g/dL (ref 3.5–5.5)
BUN/Creatinine Ratio: 11 (ref 8–20)
BUN: 7 mg/dL (ref 6–20)
Bilirubin Total: 0.7 mg/dL (ref 0.0–1.2)
CO2: 22 mmol/L (ref 18–29)
Calcium: 10.4 mg/dL — ABNORMAL HIGH (ref 8.7–10.2)
Chloride: 98 mmol/L (ref 97–108)
Creatinine, Ser: 0.66 mg/dL (ref 0.57–1.00)
GFR calc Af Amer: 137 mL/min/{1.73_m2} (ref >59)
GFR, EST NON AFRICAN AMERICAN: 119 mL/min/{1.73_m2} (ref >59)
GLUCOSE: 103 mg/dL — AB (ref 65–99)
Globulin, Total: 3.5 g/dL (ref 1.5–4.5)
POTASSIUM: 4.4 mmol/L (ref 3.5–5.2)
Sodium: 142 mmol/L (ref 134–144)
TOTAL PROTEIN: 7.6 g/dL (ref 6.0–8.5)

## 2014-09-04 LAB — VITAMIN B12: VITAMIN B 12: 632 pg/mL (ref 211–946)

## 2014-09-04 LAB — MULTIPLE MYELOMA PANEL, SERUM
ALBUMIN/GLOB SERPL: 1.1 (ref 0.7–2.0)
Albumin SerPl Elph-Mcnc: 3.8 g/dL (ref 3.2–5.6)
Alpha 1: 0.3 g/dL (ref 0.1–0.4)
Alpha2 Glob SerPl Elph-Mcnc: 1 g/dL (ref 0.4–1.2)
B-Globulin SerPl Elph-Mcnc: 1.9 g/dL — ABNORMAL HIGH (ref 0.6–1.3)
GLOBULIN, TOTAL: 3.8 g/dL (ref 2.0–4.5)
Gamma Glob SerPl Elph-Mcnc: 0.7 g/dL (ref 0.5–1.6)
IGG (IMMUNOGLOBIN G), SERUM: 827 mg/dL (ref 700–1600)
IGM (IMMUNOGLOBULIN M), SRM: 58 mg/dL (ref 26–217)
IgA/Immunoglobulin A, Serum: 841 mg/dL — ABNORMAL HIGH (ref 87–352)

## 2014-09-04 NOTE — Therapy (Signed)
[facility] [address] [place], [place], [address] Phone: [phone]   Fax:  [phone]  Occupational Therapy Evaluation  Patient Details  Name: Kara [name] MRN: [id number] Date of Birth: 02/03/1986 Referring Provider:  Asa LenteSater, Richard A, MD  Encounter Date: 09/04/2014      OT End of Session - 09/04/14 1319    Visit Number 1   Number of Visits 17   Date for OT Re-Evaluation 11/02/14   Authorization Type BC[employer] state PPO   OT Start Time 1151   OT Stop Time 1230   OT Time Calculation (min) 39 min   Activity Tolerance Patient tolerated treatment well   Behavior During Therapy [facility] for tasks assessed/performed      Past Medical History  Diagnosis Date  . Asthma   . GERD (gastroesophageal reflux disease)   . Alcohol abuse   . Environmental allergies   . Headache   . Hypertension   . Syncope and collapse   . Vision abnormalities     Past Surgical History  Procedure Laterality Date  . Tympanostomy tube placement    . Esophagogastroduodenoscopy N/A 07/17/2014    Procedure: ESOPHAGOGASTRODUODENOSCOPY (EGD);  Surgeon: Dorena CookeyJohn Hayes, MD;  [facility];  Service: Endoscopy;  Laterality: N/A;    There were no vitals filed for this visit.  Visit Diagnosis:  Muscle weakness (generalized)  Decreased coordination  Postural instability  Decreased functional activity tolerance      Subjective Assessment - 09/04/14 1309    Subjective  Pt reports she was hospitalized in March 2016 after husband forund her unconcious, pt developed weakness after hospitalization   Pertinent History polyneuropathy, possible seizure due to alcohol withdrawal, alcohol abuse   Patient Stated Goals writing, increased ease with ADLs and use of UE's   Currently in Pain? Yes   Pain Score 4    Pain Location Neck   Pain Orientation Right   Pain Descriptors / Indicators Aching   Pain Type Acute pain   Pain Onset More than a month  ago   Pain Frequency Intermittent   Aggravating Factors  malpositioning   Pain Relieving Factors reposistioning   Multiple Pain Sites No           [facility] OT Assessment - 09/04/14 0001    Assessment   Diagnosis Polyneuropathy   Onset Date --  March 2016   Assessment Pt's husband found her unconcious after a fall. Pt was hospitalized and noted weakness and decreased balance after hospitalization.   Prior Therapy PT   Precautions   Precautions Fall   Precaution Comments Possible seizure due to alcohol withdrawal, No driving   Balance Screen   Has the patient fallen in the past 6 months Yes   How many times? 2   Has the patient had a decrease in activity level because of a fear of falling?  Yes   Is the patient reluctant to leave their home because of a fear of falling?  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[employer], writing/ typing   Leisure enjoys movies, shopping   ADL   Eating/Feeding Modified independent  increased time, increased spills   Grooming Modified independent  performs seated    Upper Body Bathing Supervision/safety   Lower Body Bathing Supervision/safety   Upper Body Dressing Independent   Lower Body Dressing Needs assist for fasteners;Minimal assistance   Toilet Tranfer Supervision/safety   Tub/Shower Transfer Supervision/safety   IADL   Prior Level of Function Light Housekeeping Independent   Light Housekeeping --  performed light home management/ cooking with assist   Mobility   Mobility  Status --  supervision   Written Expression   Dominant Hand Right   Handwriting 100% legible;Increased time  hand fatigues quickly   Vision - History   Baseline Vision No visual deficits   Visual History --  lasik surgery   Activity Tolerance   Activity Tolerance Tolerates 10-20 min activity with muiltiple rests   Activity Tolerance Comments Pt reports she has not grocery shopped due to fatique/ decr. activity tolerance   Cognition   Overall Cognitive Status Within Functional Limits for tasks assessed   Sensation   Light Touch Impaired by gross assessment  fingertips, pain in hands   Additional Comments pt has had hypersensitivity in fingers at times   Coordination   Gross Motor Movements are Fluid and Coordinated Yes   Fine Motor Movements are Fluid and Coordinated No   9 Hole Peg Test Right;Left   Right 9 Hole Peg Test 25.22 secs   Left 9 Hole Peg Test 28.10 secs   Box and Blocks RUE 53 blocks LUE 39 blocks   Coordination Pt reports increased difficulty with typing and fatigue with writing.   Edema   Edema mild at left thenar eminence,   ROM / Strength   AROM / PROM / Strength AROM   AROM   Overall AROM  Within functional limits for tasks performed   Overall AROM Comments grossly WFLS bilateral UE's, except mild limitation left thumb flexion/opposition of 5th digit   Strength   Overall Strength Deficits   Overall Strength Comments RUE grossly 4/5, LUE: shoulder flex /triceps grossly 3+/5, biceps 4/5   Hand Function   Right Hand Grip (lbs) 43 lbs   Left Hand Grip (lbs) 19 lbs                           OT Short Term Goals - 09/04/14 1328    OT SHORT TERM GOAL #1   Title I with HEP.   Baseline 10/04/14   Time 4   Period Weeks   Status New   OT SHORT TERM GOAL #2   Title Pt will increase LUE grip strength to 25 lbs for increased functional use.   Time 4   Period Weeks   Status New   OT SHORT TERM GOAL #3   Title Pt will perform all basic ADLS  modified independently in a reasonable amount of time.   Time 4   Period Weeks   Status New   OT SHORT TERM GOAL #4   Title Pt will demonstrate ability to perform light home management functional tasks in standing x 15 mins without LOB / or rest break.   Time 4   Period Weeks   Status New   OT SHORT TERM GOAL #5   Title --   Time 4   Period --   Status --  OT Long Term Goals - 09/04/14 1352    OT LONG TERM GOAL #1   Title Pt will perform light home management and cooking at a modified independent level in standing   Time 8   Period Weeks   Status New   OT LONG TERM GOAL #2   Title Pt will report ability to perform functional activities and ADLS in standing for 30-45 mins prior to rest break .   Time 8   Period Weeks   Status New   OT LONG TERM GOAL #3   Title Pt will type at least 25 WPM in prep for work activities, with hand pain less than or equal to 3/10.   Time 8   Period Weeks   Status New   OT LONG TERM GOAL #4   Title Pt will use LUE as an active assist for ADLS/IADLS at least 75% of the time.   Time 8   Period Weeks   Status New               Plan - 09/04/14 1320    Clinical Impression Statement Pt with polyneuropathy presents with decreased strength, coordination and activity tolerance which impedes performance of ADLs/ IADLs   Pt will benefit from skilled therapeutic intervention in order to improve on the following deficits (Retired) Abnormal gait;Increased edema;Pain;Impaired sensation;Decreased mobility;Decreased coordination;Decreased activity tolerance;Decreased endurance;Decreased range of motion;Decreased strength;Impaired UE functional use;Impaired perceived functional ability;Difficulty walking;Decreased balance   Rehab Potential Good   Clinical Impairments Affecting Rehab Potential see above   OT Frequency 2x / week  plus eval   OT Duration 8 weeks   OT Treatment/Interventions Self-care/ADL training;Splinting;Neuromuscular  education;Parrafin;Moist Heat;Fluidtherapy;Energy conservation;Building services engineer;Therapeutic exercises;Patient/family education;Balance training;Therapeutic activities;Passive range of motion;Manual Therapy;DME and/or AE instruction;Contrast Bath;Ultrasound;Cryotherapy   Plan issue HEP for LUE coordination/ functional use   Consulted and Agree with Plan of Care Patient        Problem List Patient Active Problem List   Diagnosis Date Noted  . Polyneuropathy 09/02/2014  . History of alcohol abuse 09/02/2014  . Edema 09/02/2014  . Neck pain 09/02/2014  . Chronic lower back pain 09/02/2014  . Numbness of foot 08/02/2014  . Hand numbness 08/02/2014  . Ataxic gait 08/02/2014  . Proximal leg weakness 08/02/2014  . Constipation   . Intractable abdominal pain 07/16/2014  . Hypothermia 07/16/2014  . Upper abdominal pain   . Nausea with vomiting 07/06/2014  . Abdominal pain, epigastric 07/06/2014  . Obesity (BMI 30-39.9) 07/06/2014  . Chronic diastolic heart failure 07/06/2014  . Syncope 04/08/2014  . Prolonged Q-T interval on ECG 04/08/2014  . Hypokalemia 04/08/2014  . Alcohol abuse 04/08/2014  . Elevated LFTs 04/08/2014    RINE,KATHRYN 09/04/2014, 5:31 PM Keene Breath, OTR/L [phone] Phone: [phone] 5:31 PM 09/04/2014 [facility] [address] [place], [place], [address] Phone: [phone]   Fax:  [phone]

## 2014-09-06 ENCOUNTER — Ambulatory Visit: Payer: BC Managed Care – PPO | Admitting: Rehabilitative and Restorative Service Providers"

## 2014-09-06 DIAGNOSIS — M6281 Muscle weakness (generalized): Secondary | ICD-10-CM | POA: Diagnosis not present

## 2014-09-06 DIAGNOSIS — R269 Unspecified abnormalities of gait and mobility: Secondary | ICD-10-CM

## 2014-09-06 DIAGNOSIS — R293 Abnormal posture: Secondary | ICD-10-CM

## 2014-09-06 NOTE — Therapy (Signed)
[facility] [address] [place], [place], [address] Phone: [phone]   Fax:  [phone]  Physical Therapy Treatment  Patient Details  Name: Kara [name] MRN: [id number] Date of Birth: 04/23/1985 Referring Provider:  Gildardo Crankeross, Charles, MD  Encounter Date: 09/06/2014      PT End of Session - 09/06/14 1022    Visit Number 3   Number of Visits 9   PT Start Time 0933   PT Stop Time 1016   PT Time Calculation (min) 43 min   Equipment Utilized During Treatment Gait belt   Activity Tolerance Patient tolerated treatment well   Behavior During Therapy [facility] for tasks assessed/performed      Past Medical History  Diagnosis Date  . Asthma   . GERD (gastroesophageal reflux disease)   . Alcohol abuse   . Environmental allergies   . Headache   . Hypertension   . Syncope and collapse   . Vision abnormalities     Past Surgical History  Procedure Laterality Date  . Tympanostomy tube placement    . Esophagogastroduodenoscopy N/A 07/17/2014    Procedure: ESOPHAGOGASTRODUODENOSCOPY (EGD);  Surgeon: Dorena CookeyJohn Hayes, MD;  [facility];  Service: Endoscopy;  Laterality: N/A;    There were no vitals filed for this visit.  Visit Diagnosis:  Muscle weakness (generalized)  Abnormality of gait  Postural instability      Subjective Assessment - 09/06/14 0932    Subjective The patient reports she is having some return of sensation in her thighs.  She was able to tolerate the home exercises noting weakness in her feet.  She notes less swelling in her legs and was able to [employer] tennis shoes today.   Currently in Pain? No/denies      NEUROMUSCULAR RE-EDUCATION: Quadriped LE extension and then alternating UE/LE movements with physioball for support  Standing in stride position reaching with UEs to encourage trunk elongation and rotation x 10 reps to each side with CGA for balance support Standing trunk rotation activities with  weight bearing through UEs with focus on extending through hands adding LE foot taps with rotation for balance/postural control   THERAPEUTIC EXERCISE: Chin tucks seated on physioball x 5 reps "W" scapular retraction exercises x 5 reps with cues on elongation of spine Seated marching on physioball with holds for increased control progressing to holding UEs in 90 degree abduction for postural challenge  Gait: Ambulation on level surfaces with emphasis on arm swing and trunk rotation x 230 ft x 2 reps with CGA for safety Stair training with R UE support ascending with reciprocal pattern x 2 reps x 4 steps, then step to pattern        PT Long Term Goals - 08/30/14 1307    PT LONG TERM GOAL #1   Title Pt will be independent with HEP for improved functional mobility, strength, flexibility and balance.   Time 4   Period Weeks   Status New   PT LONG TERM GOAL #2   Title Pt will improve TUG score to less than or equal to 20 seconds for decreased fall risk.   Time 4   Period Weeks   Status New   PT LONG TERM GOAL #3   Title Pt will improve gait velocity to at least 1.8 ft/sec for improved gait efficiency and decreased fall risk.   Time 4   Period Weeks   PT LONG TERM GOAL #4   Title Pt will verbalize understanding of fall prevention  within home environment.   Time 4   Period Weeks   Status New   PT LONG TERM GOAL #5   Title Pt will verbalize understanding and fitting for potential equipment needs to assist with functional mobility.   Time 4   Period Weeks   Status New               Plan - 09/06/14 1036    Clinical Impression Statement The patient tolerated treatment well.  She is not having pain today and we were able to progress level of difficulty of balance tasks, and incorporate greater trunk/arm movements into gait activities.   PT Next Visit Plan Review HEP; continue strengthening, gentle stretching; gait activities/standing balance   Consulted and Agree with Plan  of Care Patient        Problem List Patient Active Problem List   Diagnosis Date Noted  . Polyneuropathy 09/02/2014  . History of alcohol abuse 09/02/2014  . Edema 09/02/2014  . Neck pain 09/02/2014  . Chronic lower back pain 09/02/2014  . Numbness of foot 08/02/2014  . Hand numbness 08/02/2014  . Ataxic gait 08/02/2014  . Proximal leg weakness 08/02/2014  . Constipation   . Intractable abdominal pain 07/16/2014  . Hypothermia 07/16/2014  . Upper abdominal pain   . Nausea with vomiting 07/06/2014  . Abdominal pain, epigastric 07/06/2014  . Obesity (BMI 30-39.9) 07/06/2014  . Chronic diastolic heart failure 07/06/2014  . Syncope 04/08/2014  . Prolonged Q-T interval on ECG 04/08/2014  . Hypokalemia 04/08/2014  . Alcohol abuse 04/08/2014  . Elevated LFTs 04/08/2014    Lorren Splawn, PT 09/06/2014, 10:41 AM  [facility] [address] [place], [place], [address] Phone: [phone]   Fax:  [phone]

## 2014-09-09 ENCOUNTER — Other Ambulatory Visit: Payer: Self-pay | Admitting: Neurology

## 2014-09-09 DIAGNOSIS — R945 Abnormal results of liver function studies: Secondary | ICD-10-CM

## 2014-09-09 DIAGNOSIS — G629 Polyneuropathy, unspecified: Secondary | ICD-10-CM

## 2014-09-09 DIAGNOSIS — R7989 Other specified abnormal findings of blood chemistry: Secondary | ICD-10-CM

## 2014-09-10 ENCOUNTER — Ambulatory Visit: Payer: BC Managed Care – PPO | Admitting: Occupational Therapy

## 2014-09-10 ENCOUNTER — Telehealth: Payer: Self-pay | Admitting: Neurology

## 2014-09-10 ENCOUNTER — Other Ambulatory Visit (INDEPENDENT_AMBULATORY_CARE_PROVIDER_SITE_OTHER): Payer: Self-pay

## 2014-09-10 DIAGNOSIS — R7989 Other specified abnormal findings of blood chemistry: Secondary | ICD-10-CM

## 2014-09-10 DIAGNOSIS — R945 Abnormal results of liver function studies: Secondary | ICD-10-CM

## 2014-09-10 DIAGNOSIS — R279 Unspecified lack of coordination: Secondary | ICD-10-CM

## 2014-09-10 DIAGNOSIS — M6281 Muscle weakness (generalized): Secondary | ICD-10-CM | POA: Diagnosis not present

## 2014-09-10 DIAGNOSIS — R278 Other lack of coordination: Secondary | ICD-10-CM

## 2014-09-10 DIAGNOSIS — R6889 Other general symptoms and signs: Secondary | ICD-10-CM

## 2014-09-10 DIAGNOSIS — G629 Polyneuropathy, unspecified: Secondary | ICD-10-CM

## 2014-09-10 DIAGNOSIS — Z0289 Encounter for other administrative examinations: Secondary | ICD-10-CM

## 2014-09-10 NOTE — Telephone Encounter (Signed)
I have spoken with Marchelle Folksmanda this morning and per RAS, advised that she has higher than normal amt. of one type of antibody--that this may not be significant at all, but he would like to be sure, so would like her to have some other labs drawn.  She is agreeable--sts. is already coming to our office building for OT today and will stop in our office for labs.Chucky May/fim

## 2014-09-10 NOTE — Telephone Encounter (Signed)
-----   Message from Asa Lenteichard A Sater, MD sent at 09/09/2014  2:59 PM EDT ----- She had a higher than nomral amount of one type of antibody in her blood.   May not mean anything but I  need to run another blood to see if it might be causing her neuropathy  I put in orders for cryoglobulins and Hep C labs

## 2014-09-10 NOTE — Patient Instructions (Addendum)
  Coordination Activities  Perform the following activities for 10-20 minutes 1-2 times per day with left hand(s).   Rotate ball in fingertips (clockwise and counter-clockwise).  Toss ball between hands.  Toss ball in air and catch with the same hand.  Flip cards 1 at a time as fast as you can.  Deal cards with your thumb (Hold deck in hand and push card off top with thumb).  Rotate card in hand (clockwise and counter-clockwise).  Shuffle cards.  Pick up coins, buttons, marbles, dried beans/pasta of different sizes and place in container.  Pick up coins and place in container or coin bank.  Pick up coins and stack.  Pick up coins one at a time until you get 5-10 in your hand, then move coins from palm to fingertips to stack one at a time. Twirl pen between fingers.           1. Grip Strengthening (Resistive Putty)   Squeeze putty using thumb and all fingers. Repeat _20___ times. Do __2__ sessions per day.   2. Roll putty into tube on table and pinch between each finger and thumb x 10 reps each. (can do ring and small finger together)     Copyright  VHI. All rights reserved.

## 2014-09-10 NOTE — Telephone Encounter (Signed)
Patient is calling to get results of he blood work.  Please call.  Thanks!

## 2014-09-11 NOTE — Therapy (Signed)
[facility] [address] [place], [place], [address] Phone: [phone]   Fax:  [phone]  Occupational Therapy Treatment  Patient Details  Name: Kara [name] MRN: [id number] Date of Birth: 08/23/1985 Referring Provider:  Gildardo Crankeross, Charles, MD  Encounter Date: 09/10/2014      OT End of Session - 09/10/14 1521    Visit Number 2   Number of Visits 17   Date for OT Re-Evaluation 11/02/14   Authorization Type BCBS state PPO   OT Start Time 1450   OT Stop Time 1530   OT Time Calculation (min) 40 min   Activity Tolerance Patient tolerated treatment well   Behavior [facility] Therapy [facility] for tasks assessed/performed      Past Medical History  Diagnosis Date  . Asthma   . GERD (gastroesophageal reflux disease)   . Alcohol abuse   . Environmental allergies   . Headache   . Hypertension   . Syncope and collapse   . Vision abnormalities     Past Surgical History  Procedure Laterality Date  . Tympanostomy tube placement    . Esophagogastroduodenoscopy N/A 07/17/2014    Procedure: ESOPHAGOGASTRODUODENOSCOPY (EGD);  Surgeon: Dorena CookeyJohn Hayes, MD;  [facility];  Service: Endoscopy;  Laterality: N/A;    There were no vitals filed for this visit.  Visit Diagnosis:  Muscle weakness (generalized)  Decreased coordination  Decreased functional activity tolerance      Subjective Assessment - 09/10/14 1453    Subjective  Pt reports she rested this weekend   Pertinent History polyneuropathy, possible seizure due to alcohol withdrawal, alcohol abuse   Patient Stated Goals writing, increased ease with ADLs and use of UE's   Currently in Pain? No/denies                      OT Treatments/Exercises (OP) - 09/11/14 0001    Exercises   Exercises Hand   Hand Exercises   Theraputty Roll;Grip;Pinch  issued as HEP, min v.c/instruction   Theraputty - Roll red putty with LUE,   Theraputty - Grip with LUE, red  putty   Theraputty - Pinch with LUE, red putty   Fine Motor Coordination   Fine Motor Coordination Small Pegboard;Flipping cards;Dealing card with thumb;Picking up coins;Manipulating coins;Stacking coins;Tossing ball   Small Pegboard min difficulty, and slightly increased time for in hand manipulation/ translation, copying peg design with LUE   Flipping cards with LUE   Dealing card with thumb with LUE min difficulty   Manipulating coins with LUE min difficulty for in hand manipulation   Stacking coins min difficulty with LUE   Tossing ball with bilateral UE's then left hand min difficulty/ v.c.   Other Fine Motor Exercises issued fine motor coordination HEP                OT Education - 09/11/14 2036    Education provided Yes   Education Details coordination HEP, red putty   Person(s) Educated Patient   Methods Demonstration;Explanation;Verbal cues;Handout   Comprehension Verbalized understanding;Returned demonstration          OT Short Term Goals - 09/04/14 1328    OT SHORT TERM GOAL #1   Title I with HEP.   Baseline 10/04/14   Time 4   Period Weeks   Status New   OT SHORT TERM GOAL #2   Title Pt will increase LUE grip strength to 25 lbs for increased functional use.   Time 4  Period Weeks   Status New   OT SHORT TERM GOAL #3   Title Pt will perform all basic ADLS modified independently in a reasonable amount of time.   Time 4   Period Weeks   Status New   OT SHORT TERM GOAL #4   Title Pt will demonstrate ability to perform light home management functional tasks in standing x 15 mins without LOB / or rest break.   Time 4   Period Weeks   Status New   OT SHORT TERM GOAL #5   Title --   Time 4   Period --   Status --           OT Long Term Goals - 09/04/14 1352    OT LONG TERM GOAL #1   Title Pt will perform light home management and cooking at a modified independent level in standing   Time 8   Period Weeks   Status New   OT LONG TERM GOAL #2    Title Pt will report ability to perform functional activities and ADLS in standing for 30-45 mins prior to rest break .   Time 8   Period Weeks   Status New   OT LONG TERM GOAL #3   Title Pt will type at least 25 WPM in prep for work activities, with hand pain less than or equal to 3/10.   Time 8   Period Weeks   Status New   OT LONG TERM GOAL #4   Title Pt will use LUE as an active assist for ADLS/IADLS at least 75% of the time.   Time 8   Period Weeks   Status New               Plan - 09/10/14 1523    Clinical Impression Statement Pt is progressing towards goals for coordination/ strength.   Pt will benefit from skilled therapeutic intervention in order to improve on the following deficits (Retired) Abnormal gait;Increased edema;Pain;Impaired sensation;Decreased mobility;Decreased coordination;Decreased activity tolerance;Decreased endurance;Decreased range of motion;Decreased strength;Impaired UE functional use;Impaired perceived functional ability;Difficulty walking;Decreased balance   Rehab Potential Good   Clinical Impairments Affecting Rehab Potential see above   OT Frequency 2x / week   OT Duration 8 weeks   OT Treatment/Interventions Self-care/ADL training;Splinting;Neuromuscular education;Parrafin;Moist Heat;Fluidtherapy;Energy conservation;Building services engineer;Therapeutic exercises;Patient/family education;Balance training;Therapeutic activities;Passive range of motion;Manual Therapy;DME and/or AE instruction;Contrast Bath;Ultrasound;Cryotherapy   Plan reinforce HEP, LUE functional use   OT Home Exercise Plan issued:(09/10/14)coordination, putty   Consulted and Agree with Plan of Care Patient        Problem List Patient Active Problem List   Diagnosis Date Noted  . Polyneuropathy 09/02/2014  . History of alcohol abuse 09/02/2014  . Edema 09/02/2014  . Neck pain 09/02/2014  . Chronic lower back pain 09/02/2014  . Numbness of foot 08/02/2014  . Hand  numbness 08/02/2014  . Ataxic gait 08/02/2014  . Proximal leg weakness 08/02/2014  . Constipation   . Intractable abdominal pain 07/16/2014  . Hypothermia 07/16/2014  . Upper abdominal pain   . Nausea with vomiting 07/06/2014  . Abdominal pain, epigastric 07/06/2014  . Obesity (BMI 30-39.9) 07/06/2014  . Chronic diastolic heart failure 07/06/2014  . Syncope 04/08/2014  . Prolonged Q-T interval on ECG 04/08/2014  . Hypokalemia 04/08/2014  . Alcohol abuse 04/08/2014  . Elevated LFTs 04/08/2014    Drako Maese 09/11/2014, 8:37 PM Keene Breath, OTR/L [phone] Phone: [phone] 8:37 PM 09/11/2014 [facility] [address]  [place], [place], [address] Phone: [phone]   Fax:  [phone]

## 2014-09-13 ENCOUNTER — Ambulatory Visit: Payer: BC Managed Care – PPO | Admitting: Physical Therapy

## 2014-09-13 ENCOUNTER — Encounter: Payer: Self-pay | Admitting: Physical Therapy

## 2014-09-13 ENCOUNTER — Ambulatory Visit: Payer: BC Managed Care – PPO | Admitting: Occupational Therapy

## 2014-09-13 DIAGNOSIS — R278 Other lack of coordination: Secondary | ICD-10-CM

## 2014-09-13 DIAGNOSIS — R279 Unspecified lack of coordination: Secondary | ICD-10-CM

## 2014-09-13 DIAGNOSIS — M6281 Muscle weakness (generalized): Secondary | ICD-10-CM

## 2014-09-13 DIAGNOSIS — R6889 Other general symptoms and signs: Secondary | ICD-10-CM

## 2014-09-13 NOTE — Therapy (Signed)
[facility] [address] [place], [place], [address] Phone: [phone]   Fax:  [phone]  Physical Therapy Treatment  Patient Details  Name: Kara [name] MRN: [id number] Date of Birth: 1985-07-05 Referring Provider:  Gildardo Cranker, MD  Encounter Date: 09/13/2014    09/13/14 1323  PT Visits / Re-Eval  Visit Number 4  Number of Visits 9  PT Time Calculation  PT Start Time 1317  PT Stop Time 1400  PT Time Calculation (min) 43 min  PT - End of Session  Equipment Utilized During Treatment Gait belt  Activity Tolerance Patient tolerated treatment well  Behavior During Therapy [facility] for tasks assessed/performed     09/13/14 1322  Symptoms/Limitations  Subjective No new complaints. No falls or pain to report. Still with some lower extremity swelling and hand/thumb sensitivity. Was able to get sneakers on today as well.  Pain Assessment  Currently in Pain? No/denies      Past Medical History  Diagnosis Date  . Asthma   . GERD (gastroesophageal reflux disease)   . Alcohol abuse   . Environmental allergies   . Headache   . Hypertension   . Syncope and collapse   . Vision abnormalities     Past Surgical History  Procedure Laterality Date  . Tympanostomy tube placement    . Esophagogastroduodenoscopy N/A 07/17/2014    Procedure: ESOPHAGOGASTRODUODENOSCOPY (EGD);  Surgeon: Dorena Cookey, MD;  [facility];  Service: Endoscopy;  Laterality: N/A;    There were no vitals filed for this visit.  Visit Diagnosis:  Muscle weakness (generalized)  Decreased functional activity tolerance  Decreased coordination    Treatment: Exercise Bridge 5 sec x 10 reps Table top alternating heel taps x 10 reps each side  With red pball bridge 5 sec hold x 10 reps Lower trunk rotation left/right x 10 reps each way "V" ball pass feet<>hands with extremities in extension x 10 passes Ball dribble (ball between feet)  in table top position x 10 dribbles  Quadruped position Alternating leg extensions x 10 reps each side Combo contralateral leg/arms raises x 10 each side Elbow<>opposite knee and then extending them out,   Seated on large p-ball Pelvic rocks all directions x 10 reps Long arc quads x 10 each leg Combo UE/leg raises contralateral sides x 10 each side Alternating UE reaching up with contralateral hip elevation/hike x 10 each side Alternating cross body reaching with contralateral hip movement in posterior direction x 10 each way Seated on ball: roll out into reverse plank and back up into seated position x 10 reps Table top on ball (reverse plank): alternating leg kicks (long arc quads) x 10 reps each side        PT Long Term Goals - 08/30/14 1307    PT LONG TERM GOAL #1   Title Pt will be independent with HEP for improved functional mobility, strength, flexibility and balance.   Time 4   Period Weeks   Status New   PT LONG TERM GOAL #2   Title Pt will improve TUG score to less than or equal to 20 seconds for decreased fall risk.   Time 4   Period Weeks   Status New   PT LONG TERM GOAL #3   Title Pt will improve gait velocity to at least 1.8 ft/sec for improved gait efficiency and decreased fall risk.   Time 4   Period Weeks   PT LONG TERM GOAL #4   Title Pt will  verbalize understanding of fall prevention within home environment.   Time 4   Period Weeks   Status New   PT LONG TERM GOAL #5   Title Pt will verbalize understanding and fitting for potential equipment needs to assist with functional mobility.   Time 4   Period Weeks   Status New        09/13/14 1324  Plan  Clinical Impression Statement Pt without pain today and able to tolerate advanced exercises well, without issues. Progressing toward goals.  Pt will benefit from skilled therapeutic intervention in order to improve on the following deficits Abnormal gait;Decreased activity tolerance;Decreased  balance;Decreased mobility;Decreased strength;Decreased endurance;Decreased range of motion;Difficulty walking;Increased edema;Impaired UE functional use;Impaired flexibility  Rehab Potential Good  PT Frequency 2x / week  PT Duration 4 weeks  PT Treatment/Interventions ADLs/Self Care Home Management;Functional mobility training;Gait training;Therapeutic activities;Therapeutic exercise;Balance training;Neuromuscular re-education;Patient/family education  PT Next Visit Plan continue strengthening, gentle stretching; gait activities/standing balance  Consulted and Agree with Plan of Care Patient    Problem List Patient Active Problem List   Diagnosis Date Noted  . Polyneuropathy 09/02/2014  . History of alcohol abuse 09/02/2014  . Edema 09/02/2014  . Neck pain 09/02/2014  . Chronic lower back pain 09/02/2014  . Numbness of foot 08/02/2014  . Hand numbness 08/02/2014  . Ataxic gait 08/02/2014  . Proximal leg weakness 08/02/2014  . Constipation   . Intractable abdominal pain 07/16/2014  . Hypothermia 07/16/2014  . Upper abdominal pain   . Nausea with vomiting 07/06/2014  . Abdominal pain, epigastric 07/06/2014  . Obesity (BMI 30-39.9) 07/06/2014  . Chronic diastolic heart failure 07/06/2014  . Syncope 04/08/2014  . Prolonged Q-T interval on ECG 04/08/2014  . Hypokalemia 04/08/2014  . Alcohol abuse 04/08/2014  . Elevated LFTs 04/08/2014    Sallyanne KusterBury, Kathy 09/16/2014, 8:54 PM  Sallyanne KusterKathy Bury, PTA, [facility] [address], [address] [place], [place] [address] [phone] 09/16/2014, 8:54 PM

## 2014-09-13 NOTE — Therapy (Signed)
[facility] [address] [place], [place], [address] Phone: [phone]   Fax:  [phone]  Occupational Therapy Treatment  Patient Details  Name: Kara [name] MRN: [id number] Date of Birth: May 19, 1985 Referring Provider:  Lona Kettle, MD  Encounter Date: 09/13/2014      OT End of Session - 09/13/14 1416    Visit Number 3   Number of Visits 17   Date for OT Re-Evaluation 11/02/14   Authorization Type BCBS state PPO   OT Start Time 8185   OT Stop Time 1445   OT Time Calculation (min) 42 min   Activity Tolerance Patient tolerated treatment well   Behavior During Therapy [facility] for tasks assessed/performed      Past Medical History  Diagnosis Date  . Asthma   . GERD (gastroesophageal reflux disease)   . Alcohol abuse   . Environmental allergies   . Headache   . Hypertension   . Syncope and collapse   . Vision abnormalities     Past Surgical History  Procedure Laterality Date  . Tympanostomy tube placement    . Esophagogastroduodenoscopy N/A 07/17/2014    Procedure: ESOPHAGOGASTRODUODENOSCOPY (EGD);  Surgeon: Teena Irani, MD;  [facility];  Service: Endoscopy;  Laterality: N/A;    There were no vitals filed for this visit.  Visit Diagnosis:  Muscle weakness (generalized)  Decreased coordination  Decreased functional activity tolerance      Subjective Assessment - 09/13/14 1403    Pertinent History polyneuropathy, possible seizure due to alcohol withdrawal, alcohol abuse   Patient Stated Goals writing, increased ease with ADLs and use of UE's   Currently in Pain? No/denies      Theraputic activities: placing grooved pegs in pegboard with LUE for increased fine motor coordination, removing with emphasis on in hand manipulation, min v.c.  Typing test: 50 WPM, 93% accuracy(pt reports her norm would be grossly 100 WPM) typing activities for incr. Speed and accuracy.                 OT Treatments/Exercises (OP) - 09/13/14 0001    Exercises   Exercises Shoulder;Elbow   Shoulder Exercises: Seated   Horizontal ABduction AROM;15 reps;Theraband   Theraband Level (Shoulder Horizontal ABduction) Level 2 (Red)   Other Seated Exercises Elbow flexion, A/ROM 15 reps, then elbow extension(triceps extension) 15 reps both arms, red band  min v.c./ demonstration   Shoulder Exercises: Standing   Flexion AROM;15 reps;Both  min v.c./emo   Theraband Level (Shoulder Flexion) Level 2 (Red)   Extension AROM;Both;15 reps  min v.c./ demo   Theraband Level (Shoulder Extension) Level 2 (Red)   Shoulder Exercises: ROM/Strengthening   UBE (Upper Arm Bike) 8 mins level 3 for conditioning, pt maintained grossly 40 RPM                OT Education - 09/13/14 1436    Education provided Yes   Education Details red theraband   Person(s) Educated Patient   Methods Explanation;Demonstration;Verbal cues;Handout   Comprehension Verbalized understanding;Returned demonstration          OT Short Term Goals - 09/04/14 1328    OT SHORT TERM GOAL #1   Title I with HEP.   Baseline 10/04/14   Time 4   Period Weeks   Status New   OT SHORT TERM GOAL #2   Title Pt will increase LUE grip strength to 25 lbs for increased functional use.   Time 4   Period Weeks  Status New   OT SHORT TERM GOAL #3   Title Pt will perform all basic ADLS modified independently in a reasonable amount of time.   Time 4   Period Weeks   Status New   OT SHORT TERM GOAL #4   Title Pt will demonstrate ability to perform light home management functional tasks in standing x 15 mins without LOB / or rest break.   Time 4   Period Weeks   Status New   OT SHORT TERM GOAL #5   Title --   Time 4   Period --   Status --           OT Long Term Goals - 09/13/14 1510    OT LONG TERM GOAL #1   Title Pt will perform light home management and cooking at a modified independent level in standing   Status New    OT LONG TERM GOAL #2   Title Pt will report ability to perform functional activities and ADLS in standing for 18-56 mins prior to rest break .   Status New   OT LONG TERM GOAL #3   Title Pt will type at least 25 WPM in prep for work activities, with hand pain less than or equal to 3/10.-( met, 09/13/14 50 WPM, and no pain) revised goal below   Baseline upgrade goal to: pt will type 70WPM, without reports of pain   Status Revised   OT LONG TERM GOAL #4   Title Pt will use LUE as an active assist for ADLS/IADLS at least 31% of the time.   Status New               Plan - 09/13/14 1413    Clinical Impression Statement Pt is progressing towards goals with increasing strength and coordination.   Pt will benefit from skilled therapeutic intervention in order to improve on the following deficits (Retired) Abnormal gait;Increased edema;Pain;Impaired sensation;Decreased mobility;Decreased coordination;Decreased activity tolerance;Decreased endurance;Decreased range of motion;Decreased strength;Impaired UE functional use;Impaired perceived functional ability;Difficulty walking;Decreased balance   Rehab Potential Good   Clinical Impairments Affecting Rehab Potential see above   OT Frequency 2x / week   OT Duration 8 weeks   OT Treatment/Interventions Self-care/ADL training;Splinting;Neuromuscular education;Parrafin;Moist Heat;Fluidtherapy;Energy conservation;Therapist, nutritional;Therapeutic exercises;Patient/family education;Balance training;Therapeutic activities;Passive range of motion;Manual Therapy;DME and/or AE instruction;Contrast Bath;Ultrasound;Cryotherapy   Plan progress HEP PRN, LUE functional use   OT Home Exercise Plan issued:(09/10/14)coordination, putty, red theraband issued 09/13/14   Consulted and Agree with Plan of Care Patient        Problem List Patient Active Problem List   Diagnosis Date Noted  . Polyneuropathy 09/02/2014  . History of alcohol abuse 09/02/2014   . Edema 09/02/2014  . Neck pain 09/02/2014  . Chronic lower back pain 09/02/2014  . Numbness of foot 08/02/2014  . Hand numbness 08/02/2014  . Ataxic gait 08/02/2014  . Proximal leg weakness 08/02/2014  . Constipation   . Intractable abdominal pain 07/16/2014  . Hypothermia 07/16/2014  . Upper abdominal pain   . Nausea with vomiting 07/06/2014  . Abdominal pain, epigastric 07/06/2014  . Obesity (BMI 30-39.9) 07/06/2014  . Chronic diastolic heart failure 49/70/2637  . Syncope 04/08/2014  . Prolonged Q-T interval on ECG 04/08/2014  . Hypokalemia 04/08/2014  . Alcohol abuse 04/08/2014  . Elevated LFTs 04/08/2014    RINE,KATHRYN 09/13/2014, 3:12 PM Theone Murdoch, OTR/L [phone] Phone: [phone] 3:12 PM 09/13/2014 [facility] [address] [place], [place],  [address] Phone: [phone]   Fax:  [phone]

## 2014-09-13 NOTE — Patient Instructions (Signed)
  Strengthening: Resisted Flexion   Hold tubing with _one____ arm(s) at side. Pull forward and up. Move shoulder through pain-free range of motion. Repeat __10__ times per set.  Do _1-2_ sessions per day , every other day   Strengthening: Resisted Extension   Hold tubing in _one____ hand(s), arm forward. Pull arm back, elbow straight. Repeat _10___ times per set. Do _1-2___ sessions per day, every other day.   Resisted Horizontal Abduction: Bilateral   Sit or stand, tubing in both hands, arms out in front. Keeping arms straight, pinch shoulder blades together and stretch arms out. Repeat _10___ times per set. Do _1-2___ sessions per day, every other day.   Elbow Flexion: Resisted   With tubing held in __one____ hand(s) and other end secured under foot, curl arm up as far as possible. Repeat _10___ times per set. Do _1-2___ sessions per day, every other day.    Elbow Extension: Resisted   Sit in chair with resistive band secured at armrest (or hold with other hand) and __one _____ elbow bent. Straighten elbow. Repeat _10___ times per set.  Do _1-2___ sessions per day, every other day.   Copyright  VHI. All rights reserved.   

## 2014-09-16 LAB — HEPATITIS B SURFACE ANTIGEN: HEP B S AG: NEGATIVE

## 2014-09-16 LAB — HEPATITIS C ANTIBODY: Hep C Virus Ab: <0.1 s/co ratio (ref 0.0–0.9)

## 2014-09-16 LAB — CRYOGLOBULIN

## 2014-09-16 LAB — HEPATITIS B SURFACE ANTIBODY,QUALITATIVE: HEP B SURFACE AB, QUAL: NONREACTIVE

## 2014-09-16 LAB — HEPATITIS B E ANTIGEN: Hep B E Ag: NEGATIVE

## 2014-09-17 ENCOUNTER — Ambulatory Visit: Payer: BC Managed Care – PPO | Admitting: Occupational Therapy

## 2014-09-18 ENCOUNTER — Ambulatory Visit: Payer: BC Managed Care – PPO | Attending: Neurology | Admitting: Occupational Therapy

## 2014-09-18 ENCOUNTER — Ambulatory Visit: Payer: BC Managed Care – PPO | Admitting: Physical Therapy

## 2014-09-18 DIAGNOSIS — R279 Unspecified lack of coordination: Secondary | ICD-10-CM | POA: Diagnosis present

## 2014-09-18 DIAGNOSIS — R6889 Other general symptoms and signs: Secondary | ICD-10-CM | POA: Insufficient documentation

## 2014-09-18 DIAGNOSIS — R269 Unspecified abnormalities of gait and mobility: Secondary | ICD-10-CM | POA: Diagnosis present

## 2014-09-18 DIAGNOSIS — R293 Abnormal posture: Secondary | ICD-10-CM | POA: Insufficient documentation

## 2014-09-18 DIAGNOSIS — M6281 Muscle weakness (generalized): Secondary | ICD-10-CM | POA: Diagnosis present

## 2014-09-18 DIAGNOSIS — R278 Other lack of coordination: Secondary | ICD-10-CM

## 2014-09-18 NOTE — Therapy (Signed)
[facility] [address] [place], [place], [address] Phone: [phone]   Fax:  [phone]  Occupational Therapy Treatment  Patient Details  Name: Kara [name] MRN: [id number] Date of Birth: 1986/02/25 Referring Provider:  Gildardo Cranker, MD  Encounter Date: 09/18/2014      OT End of Session - 09/18/14 0809    Visit Number 4   Number of Visits 17   Date for OT Re-Evaluation 11/02/14   Authorization Type [employer] state PPO   OT Start Time 0804   OT Stop Time 0845   OT Time Calculation (min) 41 min   Activity Tolerance Patient tolerated treatment well   Behavior During Therapy [facility] for tasks assessed/performed      Past Medical History  Diagnosis Date  . Asthma   . GERD (gastroesophageal reflux disease)   . Alcohol abuse   . Environmental allergies   . Headache   . Hypertension   . Syncope and collapse   . Vision abnormalities     Past Surgical History  Procedure Laterality Date  . Tympanostomy tube placement    . Esophagogastroduodenoscopy N/A 07/17/2014    Procedure: ESOPHAGOGASTRODUODENOSCOPY (EGD);  Surgeon: Dorena Cookey, MD;  Location: [facility];  Service: Endoscopy;  Laterality: N/A;    There were no vitals filed for this visit.  Visit Diagnosis:  Muscle weakness (generalized)  Decreased functional activity tolerance  Decreased coordination      Subjective Assessment - 09/18/14 0807    Subjective  Denies pain, requests stronger theraband   Pertinent History polyneuropathy, possible seizure due to alcohol withdrawal, alcohol abuse   Patient Stated Goals writing, increased ease with ADLs and use of UE's   Currently in Pain? No/denies                      OT Treatments/Exercises (OP) - 09/18/14 0001    Shoulder Exercises: Seated   Horizontal ABduction AROM;15 reps;Theraband   Theraband Level (Shoulder Horizontal ABduction) Level 3 (Green)   Other Seated Exercises Elbow flexion,  then extension, A/ROM 15 reps each, green band    Shoulder Exercises: Standing   Flexion AROM;15 reps;Both   Theraband Level (Shoulder Flexion) Level 3 (Green)   Extension AROM;Both;15 reps   Theraband Level (Shoulder Extension) Level 3 (Green)   Shoulder Exercises: ROM/Strengthening   UBE (Upper Arm Bike) 8 mins level 5 for conditioning   Fine Motor Coordination   Fine Motor Coordination Small Pegboard  copy design vertical surface, LUE min difficulty, overhead   Other Fine Motor Exercises I lb weight added to wrist during peg design for incr. resistance in standing, removing pegs with emphasis on in hand manipulation/ translation, min difficultry                OT Education - 09/18/14 0839    Education provided Yes   Education Details green theraband   Person(s) Educated Patient   Methods Explanation;Demonstration;Handout   Comprehension Verbalized understanding;Returned demonstration          OT Short Term Goals - 09/04/14 1328    OT SHORT TERM GOAL #1   Title I with HEP.   Baseline 10/04/14   Time 4   Period Weeks   Status New   OT SHORT TERM GOAL #2   Title Pt will increase LUE grip strength to 25 lbs for increased functional use.   Time 4   Period Weeks   Status New   OT SHORT TERM GOAL #3  Title Pt will perform all basic ADLS modified independently in a reasonable amount of time.   Time 4   Period Weeks   Status New   OT SHORT TERM GOAL #4   Title Pt will demonstrate ability to perform light home management functional tasks in standing x 15 mins without LOB / or rest break.   Time 4   Period Weeks   Status New   OT SHORT TERM GOAL #5   Title --   Time 4   Period --   Status --           OT Long Term Goals - 09/13/14 1510    OT LONG TERM GOAL #1   Title Pt will perform light home management and cooking at a modified independent level in standing   Status New   OT LONG TERM GOAL #2   Title Pt will report ability to perform functional  activities and ADLS in standing for 59-16 mins prior to rest break .   Status New   OT LONG TERM GOAL #3   Title Pt will type at least 25 WPM in prep for work activities, with hand pain less than or equal to 3/10.-( met, 09/13/14 50 WPM, and no pain) revised goal below   Baseline upgrade goal to: pt will type 70WPM, without reports of pain   Status Revised   OT LONG TERM GOAL #4   Title Pt will use LUE as an active assist for ADLS/IADLS at least 38% of the time.   Status New               Plan - 09/18/14 4665    Clinical Impression Statement Pt is progressing well towards  goals for overall strength.   Pt will benefit from skilled therapeutic intervention in order to improve on the following deficits (Retired) Abnormal gait;Increased edema;Pain;Impaired sensation;Decreased mobility;Decreased coordination;Decreased activity tolerance;Decreased endurance;Decreased range of motion;Decreased strength;Impaired UE functional use;Impaired perceived functional ability;Difficulty walking;Decreased balance   Rehab Potential Good   OT Duration 8 weeks   OT Treatment/Interventions Self-care/ADL training;Splinting;Neuromuscular education;Parrafin;Moist Heat;Fluidtherapy;Energy conservation;Therapist, nutritional;Therapeutic exercises;Patient/family education;Balance training;Therapeutic activities;Passive range of motion;Manual Therapy;DME and/or AE instruction;Contrast Bath;Ultrasound;Cryotherapy   Plan continue to address strength and coordination   OT Home Exercise Plan issued:(09/10/14)coordination, putty, red theraband issued 09/13/14   Consulted and Agree with Plan of Care Patient        Problem List Patient Active Problem List   Diagnosis Date Noted  . Polyneuropathy 09/02/2014  . History of alcohol abuse 09/02/2014  . Edema 09/02/2014  . Neck pain 09/02/2014  . Chronic lower back pain 09/02/2014  . Numbness of foot 08/02/2014  . Hand numbness 08/02/2014  . Ataxic gait  08/02/2014  . Proximal leg weakness 08/02/2014  . Constipation   . Intractable abdominal pain 07/16/2014  . Hypothermia 07/16/2014  . Upper abdominal pain   . Nausea with vomiting 07/06/2014  . Abdominal pain, epigastric 07/06/2014  . Obesity (BMI 30-39.9) 07/06/2014  . Chronic diastolic heart failure 99/35/7017  . Syncope 04/08/2014  . Prolonged Q-T interval on ECG 04/08/2014  . Hypokalemia 04/08/2014  . Alcohol abuse 04/08/2014  . Elevated LFTs 04/08/2014    Jolonda Gomm 09/18/2014, 8:41 AM Theone Murdoch, OTR/L [phone] Phone: [phone] 8:41 AM 09/18/2014 [place] [address] [place], [place], [address] Phone: [phone]   Fax:  [phone]

## 2014-09-18 NOTE — Therapy (Signed)
[facility] [address] [place], [place], [address] Phone: [phone]   Fax:  [phone]  Physical Therapy Treatment  Patient Details  Name: Kara [name] MRN: [id number] Date of Birth: 01/31/1986 Referring Provider:  Gildardo Crankeross, Charles, MD  Encounter Date: 09/18/2014      PT End of Session - 09/18/14 2225    Visit Number 5   Number of Visits 9   PT Start Time 0937   PT Stop Time 1015   PT Time Calculation (min) 38 min   Equipment Utilized During Treatment Gait belt   Activity Tolerance Patient tolerated treatment well   Behavior [facility] Therapy [facility] for tasks assessed/performed      Past Medical History  Diagnosis Date  . Asthma   . GERD (gastroesophageal reflux disease)   . Alcohol abuse   . Environmental allergies   . Headache   . Hypertension   . Syncope and collapse   . Vision abnormalities     Past Surgical History  Procedure Laterality Date  . Tympanostomy tube placement    . Esophagogastroduodenoscopy N/A 07/17/2014    Procedure: ESOPHAGOGASTRODUODENOSCOPY (EGD);  Surgeon: Dorena CookeyJohn Hayes, MD;  [facility];  Service: Endoscopy;  Laterality: N/A;    There were no vitals filed for this visit.  Visit Diagnosis:  Muscle weakness (generalized)  Abnormality of gait      Subjective Assessment - 09/18/14 0938    Subjective No new complaints.  Everything going well.  Swelling is going down-only swelling once a week; I feel like I am walking better.   Currently in Pain? Yes   Pain Score 3    Pain Location Neck   Pain Orientation Right   Pain Descriptors / Indicators Aching   Pain Type Acute pain  feels like she slept wrong   Pain Frequency Intermittent   Aggravating Factors  feels like she slept wrong   Pain Relieving Factors ice pack      Therapeutic Exercise: With legs propped on red therapy ball:  Hip and knee flexion 2 sets x 10 reps, cues for abdominal activation -bridging with legs  over ball, cues for abdominal activation and hip stability 2 sets x 10 -Trunk rotation with legs over ball 2 sets x 10 reps  Trunk strengthening in tall kneeling with blue therapy ball in front for UE support: -Alternating UE lifts with cues for abdominal activation  In quadruped over red therapy ball: -Alternating UE and leg lifts 10 reps each side, with cues for control towards end of reps with fatigue -Alternating lower extremity lifts 10 reps each side  Sit<>stand 10 reps from varied height surfaces:  From 22 inch mat no UE support, then 18 inch chair with minimal UE support, with cues for glut/quad activation upon standing, for lower extremity strengthening.   Gait: Gait training indoor surfaces, x 600 ft. With no device, with occasional cues for relaxed arm swing.  Pt noted to have decreased step length and decreased trunk rotation.  Gait x 300 ft with no device, with cues, pt able to improve arm swing, trunk rotation and heelstrike with increased step length.                                 PT Long Term Goals - 08/30/14 1307    PT LONG TERM GOAL #1   Title Pt will be independent with HEP for improved functional mobility, strength, flexibility and  balance.   Time 4   Period Weeks   Status New   PT LONG TERM GOAL #2   Title Pt will improve TUG score to less than or equal to 20 seconds for decreased fall risk.   Time 4   Period Weeks   Status New   PT LONG TERM GOAL #3   Title Pt will improve gait velocity to at least 1.8 ft/sec for improved gait efficiency and decreased fall risk.   Time 4   Period Weeks   PT LONG TERM GOAL #4   Title Pt will verbalize understanding of fall prevention within home environment.   Time 4   Period Weeks   Status New   PT LONG TERM GOAL #5   Title Pt will verbalize understanding and fitting for potential equipment needs to assist with functional mobility.   Time 4   Period Weeks   Status New                Plan - 09/18/14 2226    Clinical Impression Statement Pt appears to be tolerating exercises well, with cues for abdominal activation for improved core stability with exercises.  Pt noted to improve gait with verbal cues for improves step length and heelstrike.  Pt would continue to benefit from further skilled PT to address strength, balance and gait activities as well as stair negotiation.   Pt will benefit from skilled therapeutic intervention in order to improve on the following deficits Abnormal gait;Decreased activity tolerance;Decreased balance;Decreased mobility;Decreased strength;Decreased endurance;Decreased range of motion;Difficulty walking;Increased edema;Impaired UE functional use;Impaired flexibility   Rehab Potential Good   PT Frequency 2x / week   PT Duration 8 weeks   PT Treatment/Interventions ADLs/Self Care Home Management;Functional mobility training;Gait training;Therapeutic activities;Therapeutic exercise;Balance training;Neuromuscular re-education;Patient/family education   PT Next Visit Plan dynamic balance and gait activities, stair training activities, compliant surfaces   Consulted and Agree with Plan of Care Patient        Problem List Patient Active Problem List   Diagnosis Date Noted  . Polyneuropathy 09/02/2014  . History of alcohol abuse 09/02/2014  . Edema 09/02/2014  . Neck pain 09/02/2014  . Chronic lower back pain 09/02/2014  . Numbness of foot 08/02/2014  . Hand numbness 08/02/2014  . Ataxic gait 08/02/2014  . Proximal leg weakness 08/02/2014  . Constipation   . Intractable abdominal pain 07/16/2014  . Hypothermia 07/16/2014  . Upper abdominal pain   . Nausea with vomiting 07/06/2014  . Abdominal pain, epigastric 07/06/2014  . Obesity (BMI 30-39.9) 07/06/2014  . Chronic diastolic heart failure 07/06/2014  . Syncope 04/08/2014  . Prolonged Q-T interval on ECG 04/08/2014  . Hypokalemia 04/08/2014  . Alcohol abuse 04/08/2014  . Elevated LFTs  04/08/2014    Jerral Mccauley W. 09/18/2014, 10:29 PM  Gean Maidens., PT  [facility] [address] [place], [place], [address] Phone: [phone]   Fax:  [phone]

## 2014-09-20 ENCOUNTER — Ambulatory Visit: Payer: BC Managed Care – PPO | Admitting: Occupational Therapy

## 2014-09-20 ENCOUNTER — Ambulatory Visit: Payer: BC Managed Care – PPO | Admitting: Rehabilitative and Restorative Service Providers"

## 2014-09-20 DIAGNOSIS — R279 Unspecified lack of coordination: Secondary | ICD-10-CM

## 2014-09-20 DIAGNOSIS — R269 Unspecified abnormalities of gait and mobility: Secondary | ICD-10-CM

## 2014-09-20 DIAGNOSIS — M6281 Muscle weakness (generalized): Secondary | ICD-10-CM

## 2014-09-20 DIAGNOSIS — R6889 Other general symptoms and signs: Secondary | ICD-10-CM

## 2014-09-20 DIAGNOSIS — R278 Other lack of coordination: Secondary | ICD-10-CM

## 2014-09-20 NOTE — Patient Instructions (Signed)
Dorsiflexion: Resisted   Facing anchor, tubing around left foot, pull toward face.  Repeat ___10_ times per set. Do __2__ sets per session. Do __1-2__ sessions per day.  http://orth.exer.us/9   Copyright  VHI. All rights reserved.  Eversion: Resisted   With right foot in tubing loop, hold tubing around other foot to resist and turn foot out. Repeat __10__ times per set. Do __2__ sets per session. Do __1-2__ sessions per day.  http://orth.exer.us/15   Copyright  VHI. All rights reserved.  FUNCTIONAL MOBILITY: Step Up / Step Down   Step up, leading with left leg. Step down, leading with left leg. __5-10_ reps per set then switch sides, 1-2 times per day. Copyright  VHI. All rights reserved.

## 2014-09-20 NOTE — Therapy (Signed)
[facility] [address] [place], [place], [address] Phone: [phone]   Fax:  [phone]  Physical Therapy Treatment  Patient Details  Name: Kara [name] MRN: [id number] Date of Birth: [birth year] Referring Provider:  Gildardo Cranker, MD  Encounter Date: 09/20/2014      PT End of Session - 09/20/14 1520    Visit Number 6   Number of Visits 9   Date for PT Re-Evaluation 09/30/14   PT Start Time 1403   PT Stop Time 1445   PT Time Calculation (min) 42 min   Equipment Utilized During Treatment Gait belt   Activity Tolerance Patient tolerated treatment well   Behavior During Therapy [facility] for tasks assessed/performed      Past Medical History  Diagnosis Date  . Asthma   . GERD (gastroesophageal reflux disease)   . Alcohol abuse   . Environmental allergies   . Headache   . Hypertension   . Syncope and collapse   . Vision abnormalities     Past Surgical History  Procedure Laterality Date  . Tympanostomy tube placement    . Esophagogastroduodenoscopy N/A 07/17/2014    Procedure: ESOPHAGOGASTRODUODENOSCOPY (EGD);  Surgeon: Dorena Cookey, MD;  Location: [facility];  Service: Endoscopy;  Laterality: N/A;    There were no vitals filed for this visit.  Visit Diagnosis:  Abnormality of gait  Muscle weakness (generalized)  Decreased functional activity tolerance      Subjective Assessment - 09/20/14 1402    Subjective The patient was sore from doing squats last session and still feels it.   Currently in Pain? No/denies  "just soreness" from PT last session in quads     Gait: 38M=2.74 ft/sec without device with cues on bilateral heel strike Ambulation with cues on heel strike and arm swing Stair negotiation x 4 steps x 5 reps moving from step to pattern to descend to reciprocal pattern descending with supervision and cues on technique  THERAPEUTIC ACTIVITIES: Floor/agility ladder activities moving wide to  narrow with stance increasing speed with cues from metronome Hopping in place with difficulty with plyometric activities with ankle weakness accentuated Standing heel raises Standing dorsiflexion without ability to lift L toes without UE support/ tried with UE support with patient using knee recurvatum for momentum to accomplish movement/ transitioned to sitting without resistance, which appeared too easy Performed seated dorsiflexion with red-theraband  10 reps R/L Seated ankle eversion with red-theraband 10 reps R/L Standing heel cord stretch  NEUROMUSCULAR RE-EDUCATION: Single limb stance activities tapping between 4 cones emphasizing increased speed and consistent pacing Single limb stance knocking cone down/picking up with LEs without UE support Single limb stance control stepping off of curb with cues on technique and CGA Marching on ramp facing up/down incline with CGA for safety          PT Education - 09/20/14 1519    Education provided Yes   Education Details Dorsiflexion with red t-band, eversion with red t-band, step ups/downs with UE support for HEP   Person(s) Educated Patient   Methods Explanation;Demonstration;Handout   Comprehension Verbalized understanding;Returned demonstration             PT Long Term Goals - 09/20/14 1405    PT LONG TERM GOAL #1   Title Pt will be independent with HEP for improved functional mobility, strength, flexibility and balance.   Baseline --   Time 4   Period Weeks   Status On-going   PT LONG TERM GOAL #2  Title Pt will improve TUG score to less than or equal to 20 seconds for decreased fall risk.   Time 4   Period Weeks   Status On-going   PT LONG TERM GOAL #3   Title Pt will improve gait velocity to at least 1.8 ft/sec for improved gait efficiency and decreased fall risk.   Baseline Pt improved from 1.11 ft/sec up to 2.74 ft/sec   Time 4   Period Weeks   Status Achieved   PT LONG TERM GOAL #4   Title Pt will  verbalize understanding of fall prevention within home environment.   Baseline Discussed night light.   Time 4   Period Weeks   Status Achieved   PT LONG TERM GOAL #5   Title Pt will verbalize understanding and fitting for potential equipment needs to assist with functional mobility.   Baseline Patient is not currently needing equipment for functional mobility and is making progress in therapy.   Time 4   Period Weeks   Status Deferred               Plan - 09/20/14 1521    Clinical Impression Statement The patient met LTG for gait speed and fall prevention.  She feels safe most environments except descending steps and while up in middle of night (does not prefer use of night light).   The patient significantly improved gait speed with transition to "full community ambulator" classification of gait.  She is making great progress and PT progressing HEP for increased challenge.   PT Next Visit Plan dynamic balance and gait activities, stair training activities, compliant surfaces   Consulted and Agree with Plan of Care Patient        Problem List Patient Active Problem List   Diagnosis Date Noted  . Polyneuropathy 09/02/2014  . History of alcohol abuse 09/02/2014  . Edema 09/02/2014  . Neck pain 09/02/2014  . Chronic lower back pain 09/02/2014  . Numbness of foot 08/02/2014  . Hand numbness 08/02/2014  . Ataxic gait 08/02/2014  . Proximal leg weakness 08/02/2014  . Constipation   . Intractable abdominal pain 07/16/2014  . Hypothermia 07/16/2014  . Upper abdominal pain   . Nausea with vomiting 07/06/2014  . Abdominal pain, epigastric 07/06/2014  . Obesity (BMI 30-39.9) 07/06/2014  . Chronic diastolic heart failure 66/09/43  . Syncope 04/08/2014  . Prolonged Q-T interval on ECG 04/08/2014  . Hypokalemia 04/08/2014  . Alcohol abuse 04/08/2014  . Elevated LFTs 04/08/2014    Kara [name], PT 09/20/2014, 3:23 PM  [place] [address] [place], [place], [address] Phone: [phone]   Fax:  [phone]

## 2014-09-20 NOTE — Therapy (Signed)
[facility] [address] [place], [place], [address] Phone: [phone]   Fax:  [phone]  Occupational Therapy Treatment  Patient Details  Name: Kara [name] MRN: [id number] Date of Birth: 26-Dec-1985 Referring Provider:  Lona Kettle, MD  Encounter Date: 09/20/2014      OT End of Session - 09/20/14 1346    Visit Number 5   Number of Visits 17   Date for OT Re-Evaluation 11/02/14   Authorization Type BCBS state PPO   OT Start Time 1319   OT Stop Time 1400   OT Time Calculation (min) 41 min   Activity Tolerance Patient tolerated treatment well   Behavior During Therapy [facility] for tasks assessed/performed      Past Medical History  Diagnosis Date  . Asthma   . GERD (gastroesophageal reflux disease)   . Alcohol abuse   . Environmental allergies   . Headache   . Hypertension   . Syncope and collapse   . Vision abnormalities     Past Surgical History  Procedure Laterality Date  . Tympanostomy tube placement    . Esophagogastroduodenoscopy N/A 07/17/2014    Procedure: ESOPHAGOGASTRODUODENOSCOPY (EGD);  Surgeon: Teena Irani, MD;  Location: [facility];  Service: Endoscopy;  Laterality: N/A;    There were no vitals filed for this visit.  Visit Diagnosis:  Muscle weakness (generalized)  Decreased functional activity tolerance  Decreased coordination      Subjective Assessment - 09/20/14 1321    Pertinent History polyneuropathy, possible seizure due to alcohol withdrawal, alcohol abuse   Patient Stated Goals writing, increased ease with ADLs and use of UE's   Currently in Pain? Yes   Pain Score 5    Pain Location Leg   Pain Orientation Right;Left   Pain Descriptors / Indicators Aching   Pain Type Acute pain   Pain Onset In the past 7 days   Pain Frequency Intermittent   Aggravating Factors  exercise   Pain Relieving Factors aleeve   Multiple Pain Sites No                      OT  Treatments/Exercises (OP) - 09/20/14 0001    Shoulder Exercises: ROM/Strengthening   UBE (Upper Arm Bike) 8 mins level 5 for conditioning   Other ROM/Strengthening Exercises Plank on  elbows x 6 reps min v.c, Quadraped lifting alternate UE's, seated edge of mat lifiting hips min v.c. x 10 reps   Fine Motor Coordination   Other Fine Motor Exercises Purdue pegboard with LUE, emphasis on in hand manipulation/ translation, min difficulty/ v.c.   Functional Reaching Activities   High Level Functional overhead reaching with LUE to place graded clothespins on vertical antennae with 2 lbs weight on left wrist then removing after rest break, Pt reports fatigue following task.                   OT Short Term Goals - 09/04/14 1328    OT SHORT TERM GOAL #1   Title I with HEP.   Baseline 10/04/14   Time 4   Period Weeks   Status New   OT SHORT TERM GOAL #2   Title Pt will increase LUE grip strength to 25 lbs for increased functional use.   Time 4   Period Weeks   Status New   OT SHORT TERM GOAL #3   Title Pt will perform all basic ADLS modified independently in a reasonable amount of time.  Time 4   Period Weeks   Status New   OT SHORT TERM GOAL #4   Title Pt will demonstrate ability to perform light home management functional tasks in standing x 15 mins without LOB / or rest break.   Time 4   Period Weeks   Status New   OT SHORT TERM GOAL #5   Title --   Time 4   Period --   Status --           OT Long Term Goals - 09/13/14 1510    OT LONG TERM GOAL #1   Title Pt will perform light home management and cooking at a modified independent level in standing   Status New   OT LONG TERM GOAL #2   Title Pt will report ability to perform functional activities and ADLS in standing for 08-14 mins prior to rest break .   Status New   OT LONG TERM GOAL #3   Title Pt will type at least 25 WPM in prep for work activities, with hand pain less than or equal to 3/10.-( met, 09/13/14 50  WPM, and no pain) revised goal below   Baseline upgrade goal to: pt will type 70WPM, without reports of pain   Status Revised   OT LONG TERM GOAL #4   Title Pt will use LUE as an active assist for ADLS/IADLS at least 48% of the time.   Status New               Plan - 09/20/14 1345    Clinical Impression Statement Pt is making excellent overall progress with strenght and coordination.   Pt will benefit from skilled therapeutic intervention in order to improve on the following deficits (Retired) Abnormal gait;Increased edema;Pain;Impaired sensation;Decreased mobility;Decreased coordination;Decreased activity tolerance;Decreased endurance;Decreased range of motion;Decreased strength;Impaired UE functional use;Impaired perceived functional ability;Difficulty walking;Decreased balance   Rehab Potential Good   Clinical Impairments Affecting Rehab Potential see above   OT Frequency 2x / week   OT Duration 8 weeks   OT Treatment/Interventions Self-care/ADL training;Splinting;Neuromuscular education;Parrafin;Moist Heat;Fluidtherapy;Energy conservation;Therapist, nutritional;Therapeutic exercises;Patient/family education;Balance training;Therapeutic activities;Passive range of motion;Manual Therapy;DME and/or AE instruction;Contrast Bath;Ultrasound;Cryotherapy   OT Home Exercise Plan issued:(09/10/14)coordination, putty, red theraband issued 09/13/14, upgraded to green band 09/18/14   Consulted and Agree with Plan of Care Patient        Problem List Patient Active Problem List   Diagnosis Date Noted  . Polyneuropathy 09/02/2014  . History of alcohol abuse 09/02/2014  . Edema 09/02/2014  . Neck pain 09/02/2014  . Chronic lower back pain 09/02/2014  . Numbness of foot 08/02/2014  . Hand numbness 08/02/2014  . Ataxic gait 08/02/2014  . Proximal leg weakness 08/02/2014  . Constipation   . Intractable abdominal pain 07/16/2014  . Hypothermia 07/16/2014  . Upper abdominal pain   .  Nausea with vomiting 07/06/2014  . Abdominal pain, epigastric 07/06/2014  . Obesity (BMI 30-39.9) 07/06/2014  . Chronic diastolic heart failure 18/56/3149  . Syncope 04/08/2014  . Prolonged Q-T interval on ECG 04/08/2014  . Hypokalemia 04/08/2014  . Alcohol abuse 04/08/2014  . Elevated LFTs 04/08/2014    RINE,KATHRYN 09/20/2014, 2:00 PM Theone Murdoch, OTR/L [phone] Phone: [phone] 2:00 PM 09/20/2014 [facility] [address] [place], [place], [address] Phone: [phone]   Fax:  [phone]

## 2014-09-23 ENCOUNTER — Ambulatory Visit: Payer: BC Managed Care – PPO | Admitting: Physical Therapy

## 2014-09-23 ENCOUNTER — Ambulatory Visit: Payer: BC Managed Care – PPO | Admitting: Occupational Therapy

## 2014-09-25 ENCOUNTER — Ambulatory Visit: Payer: BC Managed Care – PPO | Admitting: Occupational Therapy

## 2014-09-27 ENCOUNTER — Ambulatory Visit: Payer: BC Managed Care – PPO | Admitting: Physical Therapy

## 2014-10-01 ENCOUNTER — Ambulatory Visit: Payer: BC Managed Care – PPO | Admitting: Occupational Therapy

## 2014-10-01 ENCOUNTER — Ambulatory Visit: Payer: BC Managed Care – PPO | Admitting: Physical Therapy

## 2014-10-01 DIAGNOSIS — M6281 Muscle weakness (generalized): Secondary | ICD-10-CM | POA: Diagnosis not present

## 2014-10-01 DIAGNOSIS — R279 Unspecified lack of coordination: Secondary | ICD-10-CM

## 2014-10-01 DIAGNOSIS — R278 Other lack of coordination: Secondary | ICD-10-CM

## 2014-10-01 DIAGNOSIS — R6889 Other general symptoms and signs: Secondary | ICD-10-CM

## 2014-10-01 DIAGNOSIS — R293 Abnormal posture: Secondary | ICD-10-CM

## 2014-10-01 NOTE — Patient Instructions (Signed)
Patient Instructions        Strengthening: Resisted Flexion   Hold tubing with ___one__ arm(s) at side. Pull forward and up. Move shoulder through pain-free range of motion. Repeat __10__ times per set.  Do _1-2_ sessions per day , every other day Blue band   Strengthening: Resisted Extension   Hold tubing in _one____ hand(s), arm forward. Pull arm back, elbow straight. Repeat _10___ times per set. Do _1-2___ sessions per day, every other day. Blue band   Resisted Horizontal Abduction: Bilateral   Sit or stand, tubing in both hands, arms out in front. Keeping arms straight, pinch shoulder blades together and stretch arms out. Repeat _10___ times per set. Do _1-2___ sessions per day, every other day. Blue band  Elbow Flexion: Resisted   With tubing held in one hand(s) and other end secured under foot, curl arm up as far as possible. Repeat _10___ times per set. Do _1-2___ sessions per day, every other day.  Green band  Elbow Extension: Resisted   Sit in chair with resistive band secured at armrest (or hold with other hand) and ___one___ elbow bent. Straighten elbow. Repeat _10___ times per set.  Do _1-2___ sessions per day, every other day.  Green band Copyright  VHI. All rights reserved.              Referring Provider       Gildardo Cranker, MD        Follow-up and Disposition       Return cancel OT appt next tuesday, reschedule for 6/1- 8 am on Wed with Kat, for add appt friday pm at 1:15 6/3 with Kat.    Follow-up and Disposition History         All Flowsheet Templates (all recorded)       8 Minute Rule OT      OT Education

## 2014-10-01 NOTE — Therapy (Signed)
[facility] [address] [place], [place], [address] Phone: [phone]   Fax:  [phone]  Occupational Therapy Treatment  Patient Details  Name: Kara [name] MRN: [id number] Date of Birth: 03-Aug-1985 Referring Provider:  Lona Kettle, MD  Encounter Date: 10/01/2014      OT End of Session - 10/01/14 1549    Visit Number 6   Number of Visits 17   Date for OT Re-Evaluation 11/02/14   Authorization Type BCBS state PPO   OT Start Time 1534   OT Stop Time 1615   OT Time Calculation (min) 41 min   Activity Tolerance Patient tolerated treatment well   Behavior During Therapy [facility] for tasks assessed/performed      Past Medical History  Diagnosis Date  . Asthma   . GERD (gastroesophageal reflux disease)   . Alcohol abuse   . Environmental allergies   . Headache   . Hypertension   . Syncope and collapse   . Vision abnormalities     Past Surgical History  Procedure Laterality Date  . Tympanostomy tube placement    . Esophagogastroduodenoscopy N/A 07/17/2014    Procedure: ESOPHAGOGASTRODUODENOSCOPY (EGD);  Surgeon: Teena Irani, MD;  Location: [facility];  Service: Endoscopy;  Laterality: N/A;    There were no vitals filed for this visit.  Visit Diagnosis:  Muscle weakness (generalized)  Decreased functional activity tolerance  Decreased coordination      Subjective Assessment - 10/01/14 1531    Subjective  Denies pain   Pertinent History polyneuropathy, possible seizure due to alcohol withdrawal, alcohol abuse   Patient Stated Goals writing, increased ease with ADLs and use of UE's   Currently in Pain? No/denies                      OT Treatments/Exercises (OP) - 10/01/14 0001    Shoulder Exercises: Standing   Flexion AROM;Both;5 reps   Theraband Level (Shoulder Flexion) Level 4 (Blue)   Extension AROM;Both;5 reps   Theraband Level (Shoulder Extension) Level 4 (Blue)   Other  Standing Exercises shoulder horizontal abduction 10 reps, blue band   Shoulder Exercises: ROM/Strengthening   UBE (Upper Arm Bike) 8 mins level 6 for conditioning   Other ROM/Strengthening Exercises Plank on  elbows x 2 sets of 5 reps reps min v.c,    Other ROM/Strengthening Exercises Quadraped lifting alternate UE/ LE for increased strenght/ core stability, min v.c.  Prone shoulder abduction then overhead x 10 reps each    Fine Motor Coordination   Fine Motor Coordination O'Connor pegs   O'Connor pegs with LUE, in hand manipulation and translation to place in pegboard with LUE, min difficulty  removing with tweezers for sustained pinch                OT Education - 10/01/14 1558    Education provided Yes   Education Details upgraded theraband to blue for shoulder flexion, extension and abduction, continue with green for elbow flex/ ext.   Person(s) Educated Patient   Methods Explanation;Demonstration;Verbal cues;Handout   Comprehension Verbalized understanding;Returned demonstration          OT Short Term Goals - 10/01/14 1544    OT SHORT TERM GOAL #1   Title I with HEP.   Baseline 10/04/14   Time 4   Period Weeks   Status Achieved   OT SHORT TERM GOAL #2   Title Pt will increase LUE grip strength to 25 lbs for increased functional  use.   Baseline LUE 47 lbs   Time 4   Period Weeks   Status Achieved   OT SHORT TERM GOAL #3   Title Pt will perform all basic ADLS modified independently in a reasonable amount of time.   Baseline met per pt report   Time 4   Period Weeks   Status Achieved   OT SHORT TERM GOAL #4   Title Pt will demonstrate ability to perform light home management functional tasks in standing x 15 mins without LOB / or rest break.   Time 4   Period Weeks   Status New   OT SHORT TERM GOAL #5   Time 4           OT Long Term Goals - 09/13/14 1510    OT LONG TERM GOAL #1   Title Pt will perform light home management and cooking at a modified  independent level in standing   Status New   OT LONG TERM GOAL #2   Title Pt will report ability to perform functional activities and ADLS in standing for 41-74 mins prior to rest break .   Status New   OT LONG TERM GOAL #3   Title Pt will type at least 25 WPM in prep for work activities, with hand pain less than or equal to 3/10.-( met, 09/13/14 50 WPM, and no pain) revised goal below   Baseline upgrade goal to: pt will type 70WPM, without reports of pain   Status Revised   OT LONG TERM GOAL #4   Title Pt will use LUE as an active assist for ADLS/IADLS at least 08% of the time.   Status New               Plan - 10/01/14 1531    Clinical Impression Statement Pt is progressing with improving strength. Pt missed last weeek due to a stomach virus   Pt will benefit from skilled therapeutic intervention in order to improve on the following deficits (Retired) Abnormal gait;Increased edema;Pain;Impaired sensation;Decreased mobility;Decreased coordination;Decreased activity tolerance;Decreased endurance;Decreased range of motion;Decreased strength;Impaired UE functional use;Impaired perceived functional ability;Difficulty walking;Decreased balance   Rehab Potential Good   OT Frequency 2x / week   OT Duration 8 weeks   Plan Finish checking STG's, standing with functional reaching, strength/ coordination   OT Home Exercise Plan issued:(09/10/14)coordination, putty, red theraband issued 09/13/14, upgraded to green band 09/18/14, upgraded several items to blue 10/01/14   Consulted and Agree with Plan of Care Patient        Problem List Patient Active Problem List   Diagnosis Date Noted  . Polyneuropathy 09/02/2014  . History of alcohol abuse 09/02/2014  . Edema 09/02/2014  . Neck pain 09/02/2014  . Chronic lower back pain 09/02/2014  . Numbness of foot 08/02/2014  . Hand numbness 08/02/2014  . Ataxic gait 08/02/2014  . Proximal leg weakness 08/02/2014  . Constipation   . Intractable  abdominal pain 07/16/2014  . Hypothermia 07/16/2014  . Upper abdominal pain   . Nausea with vomiting 07/06/2014  . Abdominal pain, epigastric 07/06/2014  . Obesity (BMI 30-39.9) 07/06/2014  . Chronic diastolic heart failure 14/48/1856  . Syncope 04/08/2014  . Prolonged Q-T interval on ECG 04/08/2014  . Hypokalemia 04/08/2014  . Alcohol abuse 04/08/2014  . Elevated LFTs 04/08/2014    RINE,KATHRYN 10/01/2014, 4:12 PM Theone Murdoch, OTR/L [phone] Phone: [phone] 4:12 PM 10/01/2014 [facility] [address] [place], [place], [address]  Phone: [phone]   Fax:  [phone]

## 2014-10-02 ENCOUNTER — Ambulatory Visit: Payer: BC Managed Care – PPO | Admitting: Occupational Therapy

## 2014-10-02 NOTE — Therapy (Signed)
[facility] [address] [place], [place], [address] Phone: [phone]   Fax:  [phone]  Physical Therapy Treatment  Patient Details  Name: Kara [name] MRN: [id number] Date of Birth: 25-Apr-1985 Referring Provider:  Gildardo Cranker, MD  Encounter Date: 10/01/2014      PT End of Session - 10/02/14 1251    Visit Number 7   Number of Visits 9   Date for PT Re-Evaluation 09/30/14   PT Start Time 1319   PT Stop Time 1402   PT Time Calculation (min) 43 min   Equipment Utilized During Treatment Gait belt   Activity Tolerance Patient tolerated treatment well   Behavior During Therapy [facility] for tasks assessed/performed      Past Medical History  Diagnosis Date  . Asthma   . GERD (gastroesophageal reflux disease)   . Alcohol abuse   . Environmental allergies   . Headache   . Hypertension   . Syncope and collapse   . Vision abnormalities     Past Surgical History  Procedure Laterality Date  . Tympanostomy tube placement    . Esophagogastroduodenoscopy N/A 07/17/2014    Procedure: ESOPHAGOGASTRODUODENOSCOPY (EGD);  Surgeon: Dorena Cookey, MD;  Location: [facility];  Service: Endoscopy;  Laterality: N/A;    There were no vitals filed for this visit.  Visit Diagnosis:  Muscle weakness (generalized)  Postural instability      Subjective Assessment - 10/02/14 1248    Subjective Pt denies falls or changes since last visit.   Patient Stated Goals Pt would like to get back to being independent again.   Currently in Pain? No/denies      Reviewed Bil LE ankle dorsiflexion and eversion with red band for resistance x 10 each. Leg press x 60# bil LE x 20 then single LE only x 40# x 20 each leg. Eliptical x 2 minutes forward level 1.5 Standing in parallel bars for standing on foam & balance beam both directions with head turns and head nods.  Standing BOSU both surfaces and squats x 10 on black platform of BOSU with  intermittent UE support.        PT Education - 10/02/14 1249    Education provided Yes   Education Details Check on equipment at gym at [facility] to use upon d/c from PT   Person(s) Educated Patient   Methods Explanation   Comprehension Verbalized understanding             PT Long Term Goals - 09/20/14 1405    PT LONG TERM GOAL #1   Title Pt will be independent with HEP for improved functional mobility, strength, flexibility and balance.   Baseline --   Time 4   Period Weeks   Status On-going   PT LONG TERM GOAL #2   Title Pt will improve TUG score to less than or equal to 20 seconds for decreased fall risk.   Time 4   Period Weeks   Status On-going   PT LONG TERM GOAL #3   Title Pt will improve gait velocity to at least 1.8 ft/sec for improved gait efficiency and decreased fall risk.   Baseline Pt improved from 1.11 ft/sec up to 2.74 ft/sec   Time 4   Period Weeks   Status Achieved   PT LONG TERM GOAL #4   Title Pt will verbalize understanding of fall prevention within home environment.   Baseline Discussed night light.   Time 4   Period Weeks  Status Achieved   PT LONG TERM GOAL #5   Title Pt will verbalize understanding and fitting for potential equipment needs to assist with functional mobility.   Baseline Patient is not currently needing equipment for functional mobility and is making progress in therapy.   Time 4   Period Weeks   Status Deferred               Plan - 10/02/14 1308    Clinical Impression Statement Pt continues to progress with strength.  Discussed options for community fitness and pt is to check on gym at her employer.  Discuss discharge with Lonia Blood, PT, as pt is at end of her POC.   Pt will benefit from skilled therapeutic intervention in order to improve on the following deficits Abnormal gait;Decreased activity tolerance;Decreased balance;Decreased mobility;Decreased strength;Decreased endurance;Decreased range of  motion;Difficulty walking;Increased edema;Impaired UE functional use;Impaired flexibility   Rehab Potential Good   PT Frequency 2x / week   PT Duration 4 weeks   PT Treatment/Interventions ADLs/Self Care Home Management;Functional mobility training;Gait training;Therapeutic activities;Therapeutic exercise;Balance training;Neuromuscular re-education;Patient/family education   PT Next Visit Plan Discuss d/c with Lonia Blood, PT.  If d/cing check goals and d/c.   Consulted and Agree with Plan of Care Patient        Problem List Patient Active Problem List   Diagnosis Date Noted  . Polyneuropathy 09/02/2014  . History of alcohol abuse 09/02/2014  . Edema 09/02/2014  . Neck pain 09/02/2014  . Chronic lower back pain 09/02/2014  . Numbness of foot 08/02/2014  . Hand numbness 08/02/2014  . Ataxic gait 08/02/2014  . Proximal leg weakness 08/02/2014  . Constipation   . Intractable abdominal pain 07/16/2014  . Hypothermia 07/16/2014  . Upper abdominal pain   . Nausea with vomiting 07/06/2014  . Abdominal pain, epigastric 07/06/2014  . Obesity (BMI 30-39.9) 07/06/2014  . Chronic diastolic heart failure 07/06/2014  . Syncope 04/08/2014  . Prolonged Q-T interval on ECG 04/08/2014  . Hypokalemia 04/08/2014  . Alcohol abuse 04/08/2014  . Elevated LFTs 04/08/2014    Newell Coral 10/02/2014, 1:10 PM  [facility] [address] [place], [place], [address] Phone: [phone]   Fax:  [phone]     Newell Coral, [facility] 10/02/2014 1:10 PM Phone: [phone] Fax: [phone]

## 2014-10-03 ENCOUNTER — Ambulatory Visit: Payer: BC Managed Care – PPO | Admitting: Physical Therapy

## 2014-10-03 DIAGNOSIS — M6281 Muscle weakness (generalized): Secondary | ICD-10-CM | POA: Diagnosis not present

## 2014-10-03 DIAGNOSIS — R269 Unspecified abnormalities of gait and mobility: Secondary | ICD-10-CM

## 2014-10-03 NOTE — Patient Instructions (Signed)
Feet Apart, Head Motion - Eyes Closed   With eyes closed and feet shoulder width apart, move head slowly, up and down. Repeat 10 times per session. Repeat with head turns side to side.  Do 2 sessions per day.  Copyright  VHI. All rights reserved.  Feet Together, Head Motion - Eyes Closed   With eyes closed and feet together, move head slowly, up and down. Repeat 10 times per session. Repeat with head turns side to side.  Do 2 sessions per day.  Copyright  VHI. All rights reserved.  Feet Apart (Compliant Surface) Head Motion - Eyes Closed   Stand on compliant surface: pillow with feet shoulder width apart. Close eyes and move head slowly, up and down. Repeat 10 times per session. Repeat with head turns side to side.  Do 2 sessions per day.  Copyright  VHI. All rights reserved.  .  Feet Together (Compliant Surface) Head Motion - Eyes Closed   Stand on compliant surface: pillow with feet together. Close eyes and move head slowly, up and down. Repeat 10 times per session. Repeat with head moving side to side.  Do 2 sessions per day.  Copyright  VHI. All rights reserved.

## 2014-10-06 NOTE — Therapy (Signed)
[facility] [address] [place], [place], [address] Phone: [phone]   Fax:  [phone]  Physical Therapy Treatment  Patient Details  Name: Kara [name] MRN: [id number] Date of Birth: 1985-05-17 Referring Provider:  Lona Kettle, MD  Encounter Date: 10/03/2014      PT End of Session - 10/06/14 1658    Visit Number 8   Number of Visits 9   Date for PT Re-Evaluation 09/30/14   PT Start Time 5170   PT Stop Time 1402   PT Time Calculation (min) 44 min   Activity Tolerance Patient tolerated treatment well   Behavior During Therapy [facility] for tasks assessed/performed      Past Medical History  Diagnosis Date  . Asthma   . GERD (gastroesophageal reflux disease)   . Alcohol abuse   . Environmental allergies   . Headache   . Hypertension   . Syncope and collapse   . Vision abnormalities     Past Surgical History  Procedure Laterality Date  . Tympanostomy tube placement    . Esophagogastroduodenoscopy N/A 07/17/2014    Procedure: ESOPHAGOGASTRODUODENOSCOPY (EGD);  Surgeon: Teena Irani, MD;  Location: [facility];  Service: Endoscopy;  Laterality: N/A;    There were no vitals filed for this visit.  Visit Diagnosis:  Abnormality of gait  Muscle weakness (generalized)                       [facility] Adult PT Treatment/Exercise - 10/06/14 1652    Berg Balance Test   Sit to Stand Able to stand without using hands and stabilize independently   Standing Unsupported Able to stand safely 2 minutes   Sitting with Back Unsupported but Feet Supported on Floor or Stool Able to sit safely and securely 2 minutes   Stand to Sit Sits safely with minimal use of hands   Transfers Able to transfer safely, minor use of hands   Standing Unsupported with Eyes Closed Able to stand 10 seconds safely   Standing Ubsupported with Feet Together Able to place feet together independently and stand 1 minute safely   From  Standing, Reach Forward with Outstretched Arm Can reach confidently >25 cm (10")   From Standing Position, Pick up Object from Floor Able to pick up shoe safely and easily   From Standing Position, Turn to Look Behind Over each Shoulder Looks behind from both sides and weight shifts well   Turn 360 Degrees Able to turn 360 degrees safely in 4 seconds or less   Standing Unsupported, Alternately Place Feet on Step/Stool Able to stand independently and safely and complete 8 steps in 20 seconds   Standing Unsupported, One Foot in Front Able to place foot tandem independently and hold 30 seconds   Standing on One Leg Able to lift leg independently and hold > 10 seconds   Total Score 56   Dynamic Gait Index   Level Surface Normal   Change in Gait Speed Normal   Gait with Horizontal Head Turns Normal   Gait with Vertical Head Turns Normal   Gait and Pivot Turn Normal   Step Over Obstacle Normal   Step Around Obstacles Normal   Steps Mild Impairment   Total Score 23   Timed Up and Go Test   TUG Normal TUG   Normal TUG (seconds) 7.69     Pt with significant LE unsteadiness while reaching to floor/squat for object.   Continues to have decreased balance  descending steps due to ankle weakness and must hold handrail to prevent fall.          Balance Exercises - 10/06/14 1655    Balance Exercises: Standing   Standing Eyes Opened Narrow base of support (BOS);Wide (BOA);Head turns;Foam/compliant surface;Solid surface;2 reps;10 secs   Standing Eyes Closed Narrow base of support (BOS);Wide (BOA);Head turns;Foam/compliant surface;Solid surface;2 reps;10 secs           PT Education - 10/06/14 1655    Education provided Yes   Education Details Goals met;renewal by Mady Haagensen, PT;Corner balance exercises   Person(s) Educated Patient   Methods Explanation;Demonstration;Handout   Comprehension Verbalized understanding             PT Long Term Goals - 10/06/14 1657    PT LONG TERM  GOAL #1   Title Pt will be independent with HEP for improved functional mobility, strength, flexibility and balance.   Time 4   Period Weeks   Status Achieved   PT LONG TERM GOAL #2   Title Pt will improve TUG score to less than or equal to 20 seconds for decreased fall risk.   Time 4   Period Weeks   Status Achieved   PT LONG TERM GOAL #3   Title Pt will improve gait velocity to at least 1.8 ft/sec for improved gait efficiency and decreased fall risk.   Baseline Pt improved from 1.11 ft/sec up to 2.74 ft/sec   Time 4   Period Weeks   Status Achieved   PT LONG TERM GOAL #4   Title Pt will verbalize understanding of fall prevention within home environment.   Baseline Discussed night light.   Time 4   Period Weeks   Status Achieved   PT LONG TERM GOAL #5   Title Pt will verbalize understanding and fitting for potential equipment needs to assist with functional mobility.   Baseline Patient is not currently needing equipment for functional mobility and is making progress in therapy.   Time 4   Period Weeks   Status Deferred               Plan - 10/06/14 1658    Clinical Impression Statement Pt met all goals set on eval.  Pt continues with ankle weakness and decrease balance with eyes closed.  Renewal per Amy Marriott,PT.     Pt will benefit from skilled therapeutic intervention in order to improve on the following deficits Abnormal gait;Decreased activity tolerance;Decreased balance;Decreased mobility;Decreased strength;Decreased endurance;Decreased range of motion;Difficulty walking;Increased edema;Impaired UE functional use;Impaired flexibility   Rehab Potential Good   PT Frequency 2x / week   PT Duration 4 weeks   PT Treatment/Interventions ADLs/Self Care Home Management;Functional mobility training;Gait training;Therapeutic activities;Therapeutic exercise;Balance training;Neuromuscular re-education;Patient/family education   PT Next Visit Plan Renewal per Amy Marriott,PT.    Consulted and Agree with Plan of Care Patient        Problem List Patient Active Problem List   Diagnosis Date Noted  . Polyneuropathy 09/02/2014  . History of alcohol abuse 09/02/2014  . Edema 09/02/2014  . Neck pain 09/02/2014  . Chronic lower back pain 09/02/2014  . Numbness of foot 08/02/2014  . Hand numbness 08/02/2014  . Ataxic gait 08/02/2014  . Proximal leg weakness 08/02/2014  . Constipation   . Intractable abdominal pain 07/16/2014  . Hypothermia 07/16/2014  . Upper abdominal pain   . Nausea with vomiting 07/06/2014  . Abdominal pain, epigastric 07/06/2014  . Obesity (BMI 30-39.9) 07/06/2014  . Chronic  diastolic heart failure 69/99/6722  . Syncope 04/08/2014  . Prolonged Q-T interval on ECG 04/08/2014  . Hypokalemia 04/08/2014  . Alcohol abuse 04/08/2014  . Elevated LFTs 04/08/2014    Narda Bonds 10/06/2014, [facility] [address] [place], [place], [address] Phone: [phone]   Fax:  [place], [place] 10/06/2014 5:02 PM Phone: [phone] Fax: [phone]

## 2014-10-08 ENCOUNTER — Ambulatory Visit: Payer: BC Managed Care – PPO | Admitting: Physical Therapy

## 2014-10-08 ENCOUNTER — Ambulatory Visit: Payer: BC Managed Care – PPO | Admitting: *Deleted

## 2014-10-08 DIAGNOSIS — M6281 Muscle weakness (generalized): Secondary | ICD-10-CM | POA: Diagnosis not present

## 2014-10-08 DIAGNOSIS — R279 Unspecified lack of coordination: Secondary | ICD-10-CM

## 2014-10-08 DIAGNOSIS — R6889 Other general symptoms and signs: Secondary | ICD-10-CM

## 2014-10-08 DIAGNOSIS — R278 Other lack of coordination: Secondary | ICD-10-CM

## 2014-10-08 NOTE — Therapy (Signed)
[facility] [address] [place], [place], [address] Phone: [phone]   Fax:  [phone]  Physical Therapy Treatment  Patient Details  Name: Kara [name] MRN: [id number] Date of Birth: April 12, 1986 Referring Provider:  Gildardo Cranker, MD  Encounter Date: 10/08/2014      PT End of Session - 10/08/14 1455    Visit Number 9   Number of Visits 9   PT Start Time 1404   PT Stop Time 1445   PT Time Calculation (min) 41 min   Equipment Utilized During Treatment Gait belt   Activity Tolerance Patient tolerated treatment well   Behavior During Therapy [facility] for tasks assessed/performed      Past Medical History  Diagnosis Date  . Asthma   . GERD (gastroesophageal reflux disease)   . Alcohol abuse   . Environmental allergies   . Headache   . Hypertension   . Syncope and collapse   . Vision abnormalities     Past Surgical History  Procedure Laterality Date  . Tympanostomy tube placement    . Esophagogastroduodenoscopy N/A 07/17/2014    Procedure: ESOPHAGOGASTRODUODENOSCOPY (EGD);  Surgeon: Dorena Cookey, MD;  Location: [facility];  Service: Endoscopy;  Laterality: N/A;    There were no vitals filed for this visit.  Visit Diagnosis:  Muscle weakness (generalized)      Subjective Assessment - 10/08/14 1410    Subjective Denies changes or falls since last visit.  Did check out [facility]  and will start 10/18/14   Patient Stated Goals Pt would like to get back to being independent again.   Currently in Pain? No/denies      Treadmill x 8 minutes with 3% incline and initial Bil UE support progressing to no UE support x 1.8 mph  Eliptical x 1 minute level 1.5 forward with bil UE support  Pulls ups with 3 second hold onto/off 6" step with bil then 1 UE support x 10 bil LE's.  Step downs from/on 6" step with bil UE support (light) x 10  Bil LE leg press x 10 x 70# and x 10 x 80# then LLE x 10 x 50# and 10 x 60# then RLE  x 10 x 50#  Squats in parallel bars with UE support x 10, squats with tap with bottom to low chair x 10 and sit to stand from simulated low couch (16") x 10          PT Long Term Goals - 10/06/14 1657    PT LONG TERM GOAL #1   Title Pt will be independent with HEP for improved functional mobility, strength, flexibility and balance.   Time 4   Period Weeks   Status Achieved   PT LONG TERM GOAL #2   Title Pt will improve TUG score to less than or equal to 20 seconds for decreased fall risk.   Time 4   Period Weeks   Status Achieved   PT LONG TERM GOAL #3   Title Pt will improve gait velocity to at least 1.8 ft/sec for improved gait efficiency and decreased fall risk.   Baseline Pt improved from 1.11 ft/sec up to 2.74 ft/sec   Time 4   Period Weeks   Status Achieved   PT LONG TERM GOAL #4   Title Pt will verbalize understanding of fall prevention within home environment.   Baseline Discussed night light.   Time 4   Period Weeks   Status Achieved   PT LONG TERM  GOAL #5   Title Pt will verbalize understanding and fitting for potential equipment needs to assist with functional mobility.   Baseline Patient is not currently needing equipment for functional mobility and is making progress in therapy.   Time 4   Period Weeks   Status Deferred               Plan - 10/08/14 1455    Clinical Impression Statement Pt continues with LE weakness (L>R) and decreased endurance.  Continue PT per POC.   Pt will benefit from skilled therapeutic intervention in order to improve on the following deficits Abnormal gait;Decreased activity tolerance;Decreased balance;Decreased mobility;Decreased strength;Decreased endurance;Decreased range of motion;Difficulty walking;Increased edema;Impaired UE functional use;Impaired flexibility   Rehab Potential Good   PT Frequency 2x / week   PT Duration 4 weeks   PT Treatment/Interventions ADLs/Self Care Home Management;Functional mobility  training;Gait training;Therapeutic activities;Therapeutic exercise;Balance training;Neuromuscular re-education;Patient/family education   PT Next Visit Plan Continue strengthening and endurance   Consulted and Agree with Plan of Care Patient        Problem List Patient Active Problem List   Diagnosis Date Noted  . Polyneuropathy 09/02/2014  . History of alcohol abuse 09/02/2014  . Edema 09/02/2014  . Neck pain 09/02/2014  . Chronic lower back pain 09/02/2014  . Numbness of foot 08/02/2014  . Hand numbness 08/02/2014  . Ataxic gait 08/02/2014  . Proximal leg weakness 08/02/2014  . Constipation   . Intractable abdominal pain 07/16/2014  . Hypothermia 07/16/2014  . Upper abdominal pain   . Nausea with vomiting 07/06/2014  . Abdominal pain, epigastric 07/06/2014  . Obesity (BMI 30-39.9) 07/06/2014  . Chronic diastolic heart failure 07/06/2014  . Syncope 04/08/2014  . Prolonged Q-T interval on ECG 04/08/2014  . Hypokalemia 04/08/2014  . Alcohol abuse 04/08/2014  . Elevated LFTs 04/08/2014    Newell Coral 10/08/2014, 2:59 PM  [facility] [address] [place], [place], [address] Phone: [phone]   Fax:  [phone]    Newell Coral, [place] [facility] 10/08/2014 2:59 PM Phone: [phone] Fax: [phone]

## 2014-10-08 NOTE — Therapy (Signed)
[facility] [address] [place], [place], [address] Phone: [phone]   Fax:  [phone]  Occupational Therapy Treatment  Patient Details  Name: Kara [name] MRN: [id number] Date of Birth: 1985/09/29 Referring Provider:  Lona Kettle, MD  Encounter Date: 10/08/2014      OT End of Session - 10/08/14 1605    Visit Number 7   Number of Visits 17   Date for OT Re-Evaluation 11/02/14   Authorization Type [employer] state PPO   OT Start Time 1450   OT Stop Time 1535   OT Time Calculation (min) 45 min   Activity Tolerance Patient tolerated treatment well   Behavior During Therapy [facility] for tasks assessed/performed      Past Medical History  Diagnosis Date  . Asthma   . GERD (gastroesophageal reflux disease)   . Alcohol abuse   . Environmental allergies   . Headache   . Hypertension   . Syncope and collapse   . Vision abnormalities     Past Surgical History  Procedure Laterality Date  . Tympanostomy tube placement    . Esophagogastroduodenoscopy N/A 07/17/2014    Procedure: ESOPHAGOGASTRODUODENOSCOPY (EGD);  Surgeon: Teena Irani, MD;  Location: [facility];  Service: Endoscopy;  Laterality: N/A;    There were no vitals filed for this visit.  Visit Diagnosis:  Muscle weakness (generalized)  Decreased coordination  Decreased functional activity tolerance      Subjective Assessment - 10/08/14 1454    Subjective  Doing really well, nothing different    Pertinent History polyneuropathy, possible seizure due to alcohol withdrawal, alcohol abuse   Patient Stated Goals dexterity to left hand and increasing functional use of left hand    Currently in Pain? No/denies      Focused skilled intervention on functional strengthening > LUE/hand. Patient engaged in hand manipulation exercise, fine motor coordination/strengthening exercise using tweezers, finger extension exercise using rubber band extender (10X, 3 sets),  wrist flexion/extension exercise using 3lb dumbbell (10X, 3 sets), wrist ulnar/radial deviation using 3lb dumbbell (10X, 3 sets). Patient stood entire exercise routine with no seated rest break needed, working towards meeting STG #5. Patient with fatigue in LUE during exercises, but no pain.                        OT Education - 10/08/14 1600    Education provided Yes   Education Details putting beads in putty for fine motor control/strengthening, education on using rubber band and/or theraputty for finger extension strengthening exercises.    Person(s) Educated Patient   Methods Explanation   Comprehension Verbalized understanding          OT Short Term Goals - 10/08/14 1604    OT SHORT TERM GOAL #1   Title I with HEP.   Baseline 10/04/14   Time 4   Period Weeks   Status Achieved   OT SHORT TERM GOAL #2   Title Pt will increase LUE grip strength to 25 lbs for increased functional use.   Baseline LUE 47 lbs   Time 4   Period Weeks   Status Achieved   OT SHORT TERM GOAL #3   Title Pt will perform all basic ADLS modified independently in a reasonable amount of time.   Baseline met per pt report   Time 4   Period Weeks   Status Achieved   OT SHORT TERM GOAL #4   Title Pt will demonstrate ability to perform light  home management functional tasks in standing x 15 mins without LOB / or rest break.   Time 4   Period Weeks   Status On-going   OT SHORT TERM GOAL #5   Time 4           OT Long Term Goals - 10/08/14 1604    OT LONG TERM GOAL #1   Title Pt will perform light home management and cooking at a modified independent level in standing   Status On-going   OT LONG TERM GOAL #2   Title Pt will report ability to perform functional activities and ADLS in standing for 75-64 mins prior to rest break .   Status On-going   OT LONG TERM GOAL #3   Title Pt will type at least 25 WPM in prep for work activities, with hand pain less than or equal to 3/10.-( met,  09/13/14 50 WPM, and no pain) revised goal below   Baseline upgrade goal to: pt will type 70WPM, without reports of pain   Status On-going   OT LONG TERM GOAL #4   Title Pt will use LUE as an active assist for ADLS/IADLS at least 33% of the time.   Status On-going               Plan - 10/08/14 1602    Clinical Impression Statement Patient continues to progress with overall strengthening of LUE/hand and her overall activity tolerance/endurance. Pt pleased with progress so far and continues to be motivated.    Pt will benefit from skilled therapeutic intervention in order to improve on the following deficits (Retired) Abnormal gait;Increased edema;Pain;Impaired sensation;Decreased mobility;Decreased coordination;Decreased activity tolerance;Decreased endurance;Decreased range of motion;Decreased strength;Impaired UE functional use;Impaired perceived functional ability;Difficulty walking;Decreased balance   Rehab Potential Good   OT Frequency 2x / week   OT Duration 8 weeks   OT Treatment/Interventions Self-care/ADL training;Splinting;Neuromuscular education;Parrafin;Moist Heat;Fluidtherapy;Energy conservation;Therapist, nutritional;Therapeutic exercises;Patient/family education;Balance training;Therapeutic activities;Passive range of motion;Manual Therapy;DME and/or AE instruction;Contrast Bath;Ultrasound;Cryotherapy   Plan standing with functional reach, strength/coordination LUE exercises, endurance   OT Home Exercise Plan issued:(09/10/14)coordination, putty, red theraband issued 09/13/14, upgraded to green band 09/18/14, upgraded several items to blue 10/01/14   Consulted and Agree with Plan of Care Patient        Problem List Patient Active Problem List   Diagnosis Date Noted  . Polyneuropathy 09/02/2014  . History of alcohol abuse 09/02/2014  . Edema 09/02/2014  . Neck pain 09/02/2014  . Chronic lower back pain 09/02/2014  . Numbness of foot 08/02/2014  . Hand numbness  08/02/2014  . Ataxic gait 08/02/2014  . Proximal leg weakness 08/02/2014  . Constipation   . Intractable abdominal pain 07/16/2014  . Hypothermia 07/16/2014  . Upper abdominal pain   . Nausea with vomiting 07/06/2014  . Abdominal pain, epigastric 07/06/2014  . Obesity (BMI 30-39.9) 07/06/2014  . Chronic diastolic heart failure 29/51/8841  . Syncope 04/08/2014  . Prolonged Q-T interval on ECG 04/08/2014  . Hypokalemia 04/08/2014  . Alcohol abuse 04/08/2014  . Elevated LFTs 04/08/2014    Alissia Lory , MS, OTR/L, CLT  10/08/2014, 4:06 PM  [facility] [address] [place], [place], [address] Phone: [phone]   Fax:  [phone]

## 2014-10-09 ENCOUNTER — Ambulatory Visit: Payer: BC Managed Care – PPO | Admitting: Physical Therapy

## 2014-10-09 ENCOUNTER — Encounter: Payer: Self-pay | Admitting: Physical Therapy

## 2014-10-09 DIAGNOSIS — R269 Unspecified abnormalities of gait and mobility: Secondary | ICD-10-CM

## 2014-10-09 DIAGNOSIS — M6281 Muscle weakness (generalized): Secondary | ICD-10-CM | POA: Diagnosis not present

## 2014-10-09 DIAGNOSIS — R6889 Other general symptoms and signs: Secondary | ICD-10-CM

## 2014-10-09 NOTE — Therapy (Signed)
[facility] [address] [place], [place], [address] Phone: [phone]   Fax:  [phone]  Physical Therapy Treatment  Patient Details  Name: Kara [name] MRN: [id number] Date of Birth: 07-Nov-1985 Referring Provider:  Gildardo Cranker, MD  Encounter Date: 10/09/2014      PT End of Session - 10/09/14 1822    Visit Number 10   Number of Visits 17  per recert 10/03/14   Date for PT Re-Evaluation 12/02/14  per recert 10/03/14-AWM   PT Start Time 1148   PT Stop Time 1228   PT Time Calculation (min) 40 min   Activity Tolerance Patient tolerated treatment well   Behavior During Therapy [facility] for tasks assessed/performed      Past Medical History  Diagnosis Date  . Asthma   . GERD (gastroesophageal reflux disease)   . Alcohol abuse   . Environmental allergies   . Headache   . Hypertension   . Syncope and collapse   . Vision abnormalities     Past Surgical History  Procedure Laterality Date  . Tympanostomy tube placement    . Esophagogastroduodenoscopy N/A 07/17/2014    Procedure: ESOPHAGOGASTRODUODENOSCOPY (EGD);  Surgeon: Dorena Cookey, MD;  Location: [facility];  Service: Endoscopy;  Laterality: N/A;    There were no vitals filed for this visit.  Visit Diagnosis:  Muscle weakness (generalized)  Decreased functional activity tolerance      Subjective Assessment - 10/09/14 1816    Subjective No changes since last visit.  Definitely feels that therapy is helping and she is stronger.   Currently in Pain? No/denies      Treadmill x 8:30 minutes with 3% incline and initial Bil UE support  x 1.8 mph, with pt going .25 miles of gait on treadmill.  Provided initial cues for heelstrike and increased step length.  Eliptical x 1 minute level 1 forward with bil UE support, then 30 seconds backwards.  Standing forward step ups x 15 reps each leg with light UE support, at 6 inch step.  Forward step-downs x 10 reps at 6  inch step, with UE support, with good muscle control during activity.  Pt does require cues for initial clearing of foot with stepping down.  Single limb standing activities for improved functional hip strength and improved single limb stance:  While in single limb stance-pt performs modified gentle squat, then lateral reaching, then trunk rotation, 3 reps each side, with intermittent support and controlled loss of single limb stance.   Sidestep squat activities set up in square target area, going clockwise and counterclockwise, 2 reps, for improved hip and lower extremity strength.                                 PT Long Term Goals - 10/09/14 1313    PT LONG TERM GOAL #1   Title Pt will be independent with HEP for improved functional mobility, strength, flexibility and balance.   Time 4   Period Weeks   Status Achieved   PT LONG TERM GOAL #2   Title Pt will improve TUG score to less than or equal to 20 seconds for decreased fall risk.   Time 4   Period Weeks   Status Achieved   PT LONG TERM GOAL #3   Title Pt will improve gait velocity to at least 1.8 ft/sec for improved gait efficiency and decreased fall risk.   Baseline Pt  improved from 1.11 ft/sec up to 2.74 ft/sec   Time 4   Period Weeks   Status Achieved   PT LONG TERM GOAL #4   Title Pt will verbalize understanding of fall prevention within home environment.   Baseline Discussed night light.   Time 4   Period Weeks   Status Achieved   PT LONG TERM GOAL #5   Title Pt will verbalize understanding and fitting for potential equipment needs to assist with functional mobility.    Baseline Patient is not currently needing equipment for functional mobility and is making progress in therapy.   Time 4   Period Weeks   Status Deferred   Additional Long Term Goals   Additional Long Term Goals Yes   PT LONG TERM GOAL #6   Title Pt will be independent with progression of HEP for improved functional  mobility, strengthening and balance.  Target 11/02/14   Time 4   Period Weeks   Status New   PT LONG TERM GOAL #7   Title Pt will be able to negotiate at least 4 steps with alternating step pattern with no handrail, independently.  Target 11/02/14   Time 4   Period Weeks   Status New   PT LONG TERM GOAL #8   Title Pt will verbalize plans for transition to community fitness program to address strengthening and endurance needs.  Target 11/02/14   Time 4   Period Weeks   Status New               Plan - 10/09/14 1824    Clinical Impression Statement Pt requires seated rest breaks during PT session.  Pt continuing to benefit from skilled PT to address balance and functional strength.   Pt will benefit from skilled therapeutic intervention in order to improve on the following deficits Abnormal gait;Decreased activity tolerance;Decreased balance;Decreased mobility;Decreased strength;Decreased endurance;Decreased range of motion;Difficulty walking;Increased edema;Impaired UE functional use;Impaired flexibility   Rehab Potential Good   PT Frequency 2x / week   PT Duration 4 weeks   PT Treatment/Interventions ADLs/Self Care Home Management;Functional mobility training;Gait training;Therapeutic activities;Therapeutic exercise;Balance training;Neuromuscular re-education;Patient/family education   PT Next Visit Plan Continue strengthening and endurance; single limb stance activities and sidestep squats added to HEP   Consulted and Agree with Plan of Care Patient        Problem List Patient Active Problem List   Diagnosis Date Noted  . Polyneuropathy 09/02/2014  . History of alcohol abuse 09/02/2014  . Edema 09/02/2014  . Neck pain 09/02/2014  . Chronic lower back pain 09/02/2014  . Numbness of foot 08/02/2014  . Hand numbness 08/02/2014  . Ataxic gait 08/02/2014  . Proximal leg weakness 08/02/2014  . Constipation   . Intractable abdominal pain 07/16/2014  . Hypothermia 07/16/2014   . Upper abdominal pain   . Nausea with vomiting 07/06/2014  . Abdominal pain, epigastric 07/06/2014  . Obesity (BMI 30-39.9) 07/06/2014  . Chronic diastolic heart failure 07/06/2014  . Syncope 04/08/2014  . Prolonged Q-T interval on ECG 04/08/2014  . Hypokalemia 04/08/2014  . Alcohol abuse 04/08/2014  . Elevated LFTs 04/08/2014    MARRIOTT,AMY W. 10/09/2014, 6:27 PM  Gean Maidens., PT  [facility] [address] [place], [place], [address] Phone: [phone]   Fax:  [phone]

## 2014-10-09 NOTE — Therapy (Signed)
[facility] [address] [place], [place], [address] Phone: [phone]   Fax:  [phone]  Patient Details  Name: Kara [name] MRN: [id number] Date of Birth: [birth year] Referring Provider:  No ref. provider found  Encounter Date: 10/09/2014  Per treatment session on 10/03/14, Nita Sells, LPTA, and Mady Haagensen, PT discussed goal progress and plan of care.  PT recommends, with pt agreement, to continue therapy for additional 4 weeks, 2x/wk.  Please see progress towards goals below.  New LTGs added 10/03/14. Physical Therapy Progress Note  Dates of Reporting Period:08/30/14 to 10/03/14  Objective Reports of Subjective Statement: Berg Balance test 56/56; Timed Up and Go score 7.69 seconds, Dynamic Gait Index 23/24.  Gait velocity improved from 1.11 ft/sec to 2.74 ft/sec.    Objective Measurements: Pt has difficulty with single limb stance.  Pt demonstrates functional lower extremity weakness, with increased lower extremity tremors/fatigue with squats, stair negotiation, with pt requiring UE support at rails for alternating step pattern stair negotiation.  Goal Update: Pt has met LTG #1-4.  LTG #5 no longer appropriate, as pt no longer has equipment needs, as she is progressing with therapy.  See below for additional goals.  Plan: Plan to continue PT 2x/wk for 4 additional weeks  Reason Skilled Services are Required: To address continued functional strength limitations, to continue to progress HEP for further strength and balance and to transition to community fitness program.       PT Long Term Goals - 10/09/14 1313    PT LONG TERM GOAL #1   Title Pt will be independent with HEP for improved functional mobility, strength, flexibility and balance.   Time 4   Period Weeks   Status Achieved   PT LONG TERM GOAL #2   Title Pt will improve TUG score to less than or equal to 20 seconds for decreased fall risk.   Time 4   Period Weeks   Status Achieved   PT LONG TERM GOAL #3   Title Pt will improve gait velocity to at least 1.8 ft/sec for improved gait efficiency and decreased fall risk.   Baseline Pt improved from 1.11 ft/sec up to 2.74 ft/sec   Time 4   Period Weeks   Status Achieved   PT LONG TERM GOAL #4   Title Pt will verbalize understanding of fall prevention within home environment.   Baseline Discussed night light.   Time 4   Period Weeks   Status Achieved   PT LONG TERM GOAL #5   Title Pt will verbalize understanding and fitting for potential equipment needs to assist with functional mobility.    Baseline Patient is not currently needing equipment for functional mobility and is making progress in therapy.   Time 4   Period Weeks   Status Deferred   Additional Long Term Goals   Additional Long Term Goals Yes   PT LONG TERM GOAL #6   Title Pt will be independent with progression of HEP for improved functional mobility, strengthening and balance.  Target 11/02/14   Time 4   Period Weeks   Status New   PT LONG TERM GOAL #7   Title Pt will be able to negotiate at least 4 steps with alternating step pattern with no handrail, independently.  Target 11/02/14   Time 4   Period Weeks   Status New   PT LONG TERM GOAL #8   Title Pt will verbalize plans for transition to community fitness program to address  strengthening and endurance needs.  Target 11/02/14   Time 4   Period Weeks   Status New      Hershall Benkert W. 10/09/2014, 1:16 PM  Frazier Butt., PT  [facility] [address] [place], [place], [address] Phone: [phone]   Fax:  [phone]

## 2014-10-10 ENCOUNTER — Encounter: Payer: Self-pay | Admitting: Occupational Therapy

## 2014-10-10 ENCOUNTER — Ambulatory Visit: Payer: BC Managed Care – PPO | Admitting: Occupational Therapy

## 2014-10-10 DIAGNOSIS — R6889 Other general symptoms and signs: Secondary | ICD-10-CM

## 2014-10-10 DIAGNOSIS — R278 Other lack of coordination: Secondary | ICD-10-CM

## 2014-10-10 DIAGNOSIS — M6281 Muscle weakness (generalized): Secondary | ICD-10-CM

## 2014-10-10 DIAGNOSIS — R279 Unspecified lack of coordination: Principal | ICD-10-CM

## 2014-10-10 NOTE — Therapy (Signed)
[facility] [address] [place], [place], [address] Phone: [phone]   Fax:  [phone]  Occupational Therapy Treatment  Patient Details  Name: Kara [name] MRN: [id number] Date of Birth: Jun 17, 1985 Referring Provider:  Lona Kettle, MD  Encounter Date: 10/10/2014      OT End of Session - 10/10/14 1454    Visit Number 8   Number of Visits 17   Date for OT Re-Evaluation 11/02/14   Authorization Type BCBS state PPO   OT Start Time 1448   OT Stop Time 1530   OT Time Calculation (min) 42 min   Activity Tolerance Patient tolerated treatment well   Behavior During Therapy [facility] for tasks assessed/performed      Past Medical History  Diagnosis Date  . Asthma   . GERD (gastroesophageal reflux disease)   . Alcohol abuse   . Environmental allergies   . Headache   . Hypertension   . Syncope and collapse   . Vision abnormalities     Past Surgical History  Procedure Laterality Date  . Tympanostomy tube placement    . Esophagogastroduodenoscopy N/A 07/17/2014    Procedure: ESOPHAGOGASTRODUODENOSCOPY (EGD);  Surgeon: Teena Irani, MD;  Location: [facility];  Service: Endoscopy;  Laterality: N/A;    There were no vitals filed for this visit.  Visit Diagnosis:  Decreased coordination  Muscle weakness (generalized)  Decreased functional activity tolerance      Subjective Assessment - 10/10/14 1455    Subjective  "Things are going well.  I'm still lacking some coordination/flexibility especially in the thumb"   Pertinent History polyneuropathy, possible seizure due to alcohol withdrawal, alcohol abuse   Patient Stated Goals dexterity to left hand and increasing functional use of left hand    Currently in Pain? No/denies                      OT Treatments/Exercises (OP) - 10/10/14 0001    Exercises   Exercises Hand   Hand Exercises   Theraputty - Flatten using red putty to flatten in various  planes with thumb   Other Hand Exercises Gross finger extension with red putty.   Fine Motor Coordination   Dealing card with thumb with LUE min difficulty   Other Fine Motor Exercises Rotating relaxation balls in L hand with min-mod difficulty, rotating ball in hand with min difficulty   Functional Reaching Activities   High Level overhead to place small pegs in vertical pegboard using in-hand manipulation with min-mod difficulty/drops/incr time.  (in standing for incr endurance x81min without rest).                  OT Short Term Goals - 10/08/14 1604    OT SHORT TERM GOAL #1   Title I with HEP.   Baseline 10/04/14   Time 4   Period Weeks   Status Achieved   OT SHORT TERM GOAL #2   Title Pt will increase LUE grip strength to 25 lbs for increased functional use.   Baseline LUE 47 lbs   Time 4   Period Weeks   Status Achieved   OT SHORT TERM GOAL #3   Title Pt will perform all basic ADLS modified independently in a reasonable amount of time.   Baseline met per pt report   Time 4   Period Weeks   Status Achieved   OT SHORT TERM GOAL #4   Title Pt will demonstrate ability to perform light home  management functional tasks in standing x 15 mins without LOB / or rest break.   Time 4   Period Weeks   Status On-going   OT SHORT TERM GOAL #5   Time 4           OT Long Term Goals - 10/08/14 1604    OT LONG TERM GOAL #1   Title Pt will perform light home management and cooking at a modified independent level in standing   Status On-going   OT LONG TERM GOAL #2   Title Pt will report ability to perform functional activities and ADLS in standing for 48-18 mins prior to rest break .   Status On-going   OT LONG TERM GOAL #3   Title Pt will type at least 25 WPM in prep for work activities, with hand pain less than or equal to 3/10.-( met, 09/13/14 50 WPM, and no pain) revised goal below   Baseline upgrade goal to: pt will type 70WPM, without reports of pain   Status  On-going   OT LONG TERM GOAL #4   Title Pt will use LUE as an active assist for ADLS/IADLS at least 56% of the time.   Status On-going               Plan - 10/10/14 1507    Clinical Impression Statement Pt progressing towards goals and reports improved typing speed.   Plan endurance, coordination, strength, ?typing   Consulted and Agree with Plan of Care Patient        Problem List Patient Active Problem List   Diagnosis Date Noted  . Polyneuropathy 09/02/2014  . History of alcohol abuse 09/02/2014  . Edema 09/02/2014  . Neck pain 09/02/2014  . Chronic lower back pain 09/02/2014  . Numbness of foot 08/02/2014  . Hand numbness 08/02/2014  . Ataxic gait 08/02/2014  . Proximal leg weakness 08/02/2014  . Constipation   . Intractable abdominal pain 07/16/2014  . Hypothermia 07/16/2014  . Upper abdominal pain   . Nausea with vomiting 07/06/2014  . Abdominal pain, epigastric 07/06/2014  . Obesity (BMI 30-39.9) 07/06/2014  . Chronic diastolic heart failure 31/49/7026  . Syncope 04/08/2014  . Prolonged Q-T interval on ECG 04/08/2014  . Hypokalemia 04/08/2014  . Alcohol abuse 04/08/2014  . Elevated LFTs 04/08/2014    [facility] 10/10/2014, 4:23 PM  [facility] [address] [place], [place], [address] Phone: [phone]   Fax:  [place], OTR/L 10/10/2014 4:23 PM

## 2014-10-15 ENCOUNTER — Ambulatory Visit: Payer: BC Managed Care – PPO | Admitting: Occupational Therapy

## 2014-10-15 ENCOUNTER — Ambulatory Visit: Payer: BC Managed Care – PPO | Admitting: Physical Therapy

## 2014-10-15 DIAGNOSIS — R6889 Other general symptoms and signs: Secondary | ICD-10-CM

## 2014-10-15 DIAGNOSIS — M6281 Muscle weakness (generalized): Secondary | ICD-10-CM | POA: Diagnosis not present

## 2014-10-15 DIAGNOSIS — R279 Unspecified lack of coordination: Principal | ICD-10-CM

## 2014-10-15 DIAGNOSIS — R278 Other lack of coordination: Secondary | ICD-10-CM

## 2014-10-15 NOTE — Therapy (Signed)
[facility] [address] [place], [place], [address] Phone: [phone]   Fax:  [phone]  Occupational Therapy Treatment  Patient Details  Name: Kara [name] MRN: [id number] Date of Birth: 17-Mar-1986 Referring Provider:  Lona Kettle, MD  Encounter Date: 10/15/2014      OT End of Session - 10/15/14 1243    Visit Number 9   Number of Visits 17   Date for OT Re-Evaluation 11/02/14   Authorization Type BCBS state PPO   OT Start Time 1236   OT Stop Time 1315   OT Time Calculation (min) 39 min   Activity Tolerance Patient tolerated treatment well   Behavior During Therapy [facility] for tasks assessed/performed      Past Medical History  Diagnosis Date  . Asthma   . GERD (gastroesophageal reflux disease)   . Alcohol abuse   . Environmental allergies   . Headache   . Hypertension   . Syncope and collapse   . Vision abnormalities     Past Surgical History  Procedure Laterality Date  . Tympanostomy tube placement    . Esophagogastroduodenoscopy N/A 07/17/2014    Procedure: ESOPHAGOGASTRODUODENOSCOPY (EGD);  Surgeon: Kara Irani, MD;  Location: [facility];  Service: Endoscopy;  Laterality: N/A;    There were no vitals filed for this visit.  Visit Diagnosis:  Decreased coordination  Muscle weakness (generalized)  Decreased functional activity tolerance      Subjective Assessment - 10/15/14 1238    Subjective  denies pain   Pertinent History polyneuropathy, possible seizure due to alcohol withdrawal, alcohol abuse   Patient Stated Goals dexterity to left hand and increasing functional use of left hand    Currently in Pain? No/denies        Treatment: Theraputic activities: Pt reports that red putty has become easy, therapist upgraded putty HEP to green. Fine motor coordination tasks: Dealing cards with thumb with improved performance. placing grooved pegs in pegboard with tweezers then with in hand  manipulation, holding several in hand, and translating to hole, mod difficulty, min v.c. Dribbling ball with LUE for increased gross motor coordination and endurance. There ex:Arm bike x 5 mins level 6 for conditioning.                        OT Short Term Goals - 10/08/14 1604    OT SHORT TERM GOAL #1   Title I with HEP.   Baseline 10/04/14   Time 4   Period Weeks   Status Achieved   OT SHORT TERM GOAL #2   Title Pt will increase LUE grip strength to 25 lbs for increased functional use.   Baseline LUE 47 lbs   Time 4   Period Weeks   Status Achieved   OT SHORT TERM GOAL #3   Title Pt will perform all basic ADLS modified independently in a reasonable amount of time.   Baseline met per pt report   Time 4   Period Weeks   Status Achieved   OT SHORT TERM GOAL #4   Title Pt will demonstrate ability to perform light home management functional tasks in standing x 15 mins without LOB / or rest break.   Time 4   Period Weeks   Status On-going   OT SHORT TERM GOAL #5   Time 4           OT Long Term Goals - 10/08/14 1604    OT LONG TERM GOAL #  1   Title Pt will perform light home management and cooking at a modified independent level in standing   Status On-going   OT LONG TERM GOAL #2   Title Pt will report ability to perform functional activities and ADLS in standing for 54-56 mins prior to rest break .   Status On-going   OT LONG TERM GOAL #3   Title Pt will type at least 25 WPM in prep for work activities, with hand pain less than or equal to 3/10.-( met, 09/13/14 50 WPM, and no pain) revised goal below   Baseline upgrade goal to: pt will type 70WPM, without reports of pain   Status On-going   OT LONG TERM GOAL #4   Title Pt will use LUE as an active assist for ADLS/IADLS at least 25% of the time.   Status On-going               Plan - 10/15/14 1240    Clinical Impression Statement Pt reports red putty is getting easy. Therapist upgraded putty to  greeen for increased resistance. Pt is progressing towards goals.   Pt will benefit from skilled therapeutic intervention in order to improve on the following deficits (Retired) Abnormal gait;Increased edema;Pain;Impaired sensation;Decreased mobility;Decreased coordination;Decreased activity tolerance;Decreased endurance;Decreased range of motion;Decreased strength;Impaired UE functional use;Impaired perceived functional ability;Difficulty walking;Decreased balance   Rehab Potential Good   OT Frequency 2x / week   OT Duration 8 weeks   OT Treatment/Interventions Self-care/ADL training;Splinting;Neuromuscular education;Parrafin;Moist Heat;Fluidtherapy;Energy conservation;Therapist, nutritional;Therapeutic exercises;Patient/family education;Balance training;Therapeutic activities;Passive range of motion;Manual Therapy;DME and/or AE instruction;Contrast Bath;Ultrasound;Cryotherapy   Plan coordination, typing   OT Home Exercise Plan issued:(09/10/14)coordination, putty, red theraband issued 09/13/14, upgraded to green band 09/18/14, upgraded several items to blue 10/01/14   Consulted and Agree with Plan of Care Patient        Problem List Patient Active Problem List   Diagnosis Date Noted  . Polyneuropathy 09/02/2014  . History of alcohol abuse 09/02/2014  . Edema 09/02/2014  . Neck pain 09/02/2014  . Chronic lower back pain 09/02/2014  . Numbness of foot 08/02/2014  . Hand numbness 08/02/2014  . Ataxic gait 08/02/2014  . Proximal leg weakness 08/02/2014  . Constipation   . Intractable abdominal pain 07/16/2014  . Hypothermia 07/16/2014  . Upper abdominal pain   . Nausea with vomiting 07/06/2014  . Abdominal pain, epigastric 07/06/2014  . Obesity (BMI 30-39.9) 07/06/2014  . Chronic diastolic heart failure 63/89/3734  . Syncope 04/08/2014  . Prolonged Q-T interval on ECG 04/08/2014  . Hypokalemia 04/08/2014  . Alcohol abuse 04/08/2014  . Elevated LFTs 04/08/2014     Kara [name] 10/15/2014, 5:45 PM Kara [name], OTR/L [phone] Phone: [phone] 5:45 PM 06/28/2016[facility] [address] [place], [place], [address] Phone: [phone]   Fax:  [phone]

## 2014-10-15 NOTE — Therapy (Signed)
[facility] [address] [place], [place], [address] Phone: [phone]   Fax:  [phone]  Physical Therapy Treatment  Patient Details  Name: Kara [name] MRN: [id number] Date of Birth: [birth year] Referring Provider:  Gildardo Cranker, MD  Encounter Date: 10/15/2014      PT End of Session - 10/15/14 1240    Visit Number 11   Number of Visits 17   Date for PT Re-Evaluation 12/02/14   PT Start Time 1148   PT Stop Time 1231   PT Time Calculation (min) 43 min   Activity Tolerance Patient tolerated treatment well   Behavior During Therapy [facility] for tasks assessed/performed      Past Medical History  Diagnosis Date  . Asthma   . GERD (gastroesophageal reflux disease)   . Alcohol abuse   . Environmental allergies   . Headache   . Hypertension   . Syncope and collapse   . Vision abnormalities     Past Surgical History  Procedure Laterality Date  . Tympanostomy tube placement    . Esophagogastroduodenoscopy N/A 07/17/2014    Procedure: ESOPHAGOGASTRODUODENOSCOPY (EGD);  Surgeon: Dorena Cookey, MD;  Location: [facility];  Service: Endoscopy;  Laterality: N/A;    There were no vitals filed for this visit.  Visit Diagnosis:  Muscle weakness (generalized)      Subjective Assessment - 10/15/14 1149    Subjective Did some yard work this weekend.  Denies falls or changes.   Patient Stated Goals Pt would like to get back to being independent again.   Currently in Pain? No/denies     Plank x 15 second hold x 3 reps Side planks in R sidelying x 6 seconds x 1, 11 seconds x 1 Wall squats x up to 23 second hold x 4 reps Bil leg press 70# x 20 then 80# x 15 Treadmill x 8 minutes at 4% incline at 2.0 mph then 2 minutes sideways to R then 2 minutes sideways to left-1UE support          PT Long Term Goals - 10/09/14 1313    PT LONG TERM GOAL #1   Title Pt will be independent with HEP for improved functional  mobility, strength, flexibility and balance.   Time 4   Period Weeks   Status Achieved   PT LONG TERM GOAL #2   Title Pt will improve TUG score to less than or equal to 20 seconds for decreased fall risk.   Time 4   Period Weeks   Status Achieved   PT LONG TERM GOAL #3   Title Pt will improve gait velocity to at least 1.8 ft/sec for improved gait efficiency and decreased fall risk.   Baseline Pt improved from 1.11 ft/sec up to 2.74 ft/sec   Time 4   Period Weeks   Status Achieved   PT LONG TERM GOAL #4   Title Pt will verbalize understanding of fall prevention within home environment.   Baseline Discussed night light.   Time 4   Period Weeks   Status Achieved   PT LONG TERM GOAL #5   Title Pt will verbalize understanding and fitting for potential equipment needs to assist with functional mobility.    Baseline Patient is not currently needing equipment for functional mobility and is making progress in therapy.   Time 4   Period Weeks   Status Deferred   Additional Long Term Goals   Additional Long Term Goals Yes   PT LONG  TERM GOAL #6   Title Pt will be independent with progression of HEP for improved functional mobility, strengthening and balance.  Target 11/02/14   Time 4   Period Weeks   Status New   PT LONG TERM GOAL #7   Title Pt will be able to negotiate at least 4 steps with alternating step pattern with no handrail, independently.  Target 11/02/14   Time 4   Period Weeks   Status New   PT LONG TERM GOAL #8   Title Pt will verbalize plans for transition to community fitness program to address strengthening and endurance needs.  Target 11/02/14   Time 4   Period Weeks   Status New               Plan - 10/15/14 1240    Clinical Impression Statement Pt continues to progress well with PT with improving strength and endurance.  Pt will be joining gym starting 10/18/14 and feels prepared for d/c on next visit.   Pt will benefit from skilled therapeutic intervention  in order to improve on the following deficits Abnormal gait;Decreased activity tolerance;Decreased balance;Decreased mobility;Decreased strength;Decreased endurance;Decreased range of motion;Difficulty walking;Increased edema;Impaired UE functional use;Impaired flexibility   Rehab Potential Good   PT Frequency 2x / week   PT Duration 4 weeks   PT Treatment/Interventions ADLs/Self Care Home Management;Functional mobility training;Gait training;Therapeutic activities;Therapeutic exercise;Balance training;Neuromuscular re-education;Patient/family education   PT Next Visit Plan Check goals, FOTO and d/c from PT.   Consulted and Agree with Plan of Care Patient        Problem List Patient Active Problem List   Diagnosis Date Noted  . Polyneuropathy 09/02/2014  . History of alcohol abuse 09/02/2014  . Edema 09/02/2014  . Neck pain 09/02/2014  . Chronic lower back pain 09/02/2014  . Numbness of foot 08/02/2014  . Hand numbness 08/02/2014  . Ataxic gait 08/02/2014  . Proximal leg weakness 08/02/2014  . Constipation   . Intractable abdominal pain 07/16/2014  . Hypothermia 07/16/2014  . Upper abdominal pain   . Nausea with vomiting 07/06/2014  . Abdominal pain, epigastric 07/06/2014  . Obesity (BMI 30-39.9) 07/06/2014  . Chronic diastolic heart failure 07/06/2014  . Syncope 04/08/2014  . Prolonged Q-T interval on ECG 04/08/2014  . Hypokalemia 04/08/2014  . Alcohol abuse 04/08/2014  . Elevated LFTs 04/08/2014    Newell CoralRobertson, Denise Terry 10/15/2014, 12:48 PM  [facility] [address] [place], [place], [address] Phone: [phone]   Fax:  [phone]    Newell CoralDenise Terry Robertson, [place]PTA [facility] 10/15/2014 12:48 PM Phone: [phone] Fax: [phone]

## 2014-10-16 ENCOUNTER — Ambulatory Visit: Payer: BC Managed Care – PPO | Admitting: Occupational Therapy

## 2014-10-17 ENCOUNTER — Ambulatory Visit: Payer: BC Managed Care – PPO | Admitting: Physical Therapy

## 2014-10-17 ENCOUNTER — Ambulatory Visit: Payer: BC Managed Care – PPO | Admitting: Occupational Therapy

## 2014-10-17 DIAGNOSIS — R278 Other lack of coordination: Secondary | ICD-10-CM

## 2014-10-17 DIAGNOSIS — M6281 Muscle weakness (generalized): Secondary | ICD-10-CM | POA: Diagnosis not present

## 2014-10-17 DIAGNOSIS — R279 Unspecified lack of coordination: Principal | ICD-10-CM

## 2014-10-17 DIAGNOSIS — R6889 Other general symptoms and signs: Secondary | ICD-10-CM

## 2014-10-17 DIAGNOSIS — R293 Abnormal posture: Secondary | ICD-10-CM

## 2014-10-17 NOTE — Therapy (Signed)
[facility] [address] [place], [place], [address] Phone: [phone]   Fax:  [phone]  Physical Therapy Treatment  Patient Details  Name: Kara [name] MRN: [id number] Date of Birth: [birth year] Referring Provider:  Lona Kettle, MD  Encounter Date: 10/17/2014      PT End of Session - 10/17/14 1136    Visit Number 12   Number of Visits 17   Date for PT Re-Evaluation 12/02/14   PT Start Time 9030   PT Stop Time 0930   PT Time Calculation (min) 43 min   Activity Tolerance Patient tolerated treatment well   Behavior During Therapy [facility] for tasks assessed/performed      Past Medical History  Diagnosis Date  . Asthma   . GERD (gastroesophageal reflux disease)   . Alcohol abuse   . Environmental allergies   . Headache   . Hypertension   . Syncope and collapse   . Vision abnormalities     Past Surgical History  Procedure Laterality Date  . Tympanostomy tube placement    . Esophagogastroduodenoscopy N/A 07/17/2014    Procedure: ESOPHAGOGASTRODUODENOSCOPY (EGD);  Surgeon: Teena Irani, MD;  Location: [facility];  Service: Endoscopy;  Laterality: N/A;    There were no vitals filed for this visit.  Visit Diagnosis:  Postural instability  Muscle weakness (generalized)      Subjective Assessment - 10/17/14 0851    Subjective Feels ready for discharge   Patient Stated Goals Pt would like to get back to being independent again.   Currently in Pain? No/denies                         [facility] Adult PT Treatment/Exercise - 10/17/14 0922    Transfers   Transfers Sit to Stand;Stand to Sit   Sit to Stand 7: Independent   Stand to Sit 7: Independent   Ambulation/Gait   Ambulation/Gait Yes   Ambulation/Gait Assistance 7: Independent   Assistive device None   Gait Pattern Step-through pattern;Wide base of support   Ambulation Surface Level;Indoor   Stairs Yes   Stairs Assistance 7:  Independent   Stair Management Technique No rails;Forwards   Number of Stairs 8   Height of Stairs 6   Gait Comments Had pt repeat steps numerous times while carrying laundry basket up/down stairs as her laundry room is on second floor at home.  Pt able to do so independently.   Lumbar Exercises: Quadruped   Opposite Arm/Leg Raise 10 reps;Right arm/Left leg;Left arm/Right leg;3 seconds   Other Quadruped Lumbar Exercises Opposite arm/leg raise with crunch in between x 6 reps   Knee/Hip Exercises: Aerobic   Elliptical 2 minutes forward and 2 minutes backward level 1.5 with 1 minute rest in between   Knee/Hip Exercises: Machines for Strengthening   Cybex Knee Extension 80# bil LE x 20, 90# bil LE x 20                PT Education - 10/17/14 1130    Education provided Yes   Education Details Community fitness;goals met   Person(s) Educated Patient   Methods Explanation   Comprehension Verbalized understanding             PT Long Term Goals - 10/17/14 1134    PT LONG TERM GOAL #1   Title Pt will be independent with HEP for improved functional mobility, strength, flexibility and balance.   Time 4   Period Weeks  Status Achieved   PT LONG TERM GOAL #2   Title Pt will improve TUG score to less than or equal to 20 seconds for decreased fall risk.   Time 4   Period Weeks   Status Achieved   PT LONG TERM GOAL #3   Title Pt will improve gait velocity to at least 1.8 ft/sec for improved gait efficiency and decreased fall risk.   Baseline Pt improved from 1.11 ft/sec up to 2.74 ft/sec   Time 4   Period Weeks   Status Achieved   PT LONG TERM GOAL #4   Title Pt will verbalize understanding of fall prevention within home environment.   Baseline Discussed night light.   Time 4   Period Weeks   Status Achieved   PT LONG TERM GOAL #5   Title Pt will verbalize understanding and fitting for potential equipment needs to assist with functional mobility.    Baseline Patient is  not currently needing equipment for functional mobility and is making progress in therapy.   Time 4   Period Weeks   Status Deferred   PT LONG TERM GOAL #6   Title Pt will be independent with progression of HEP for improved functional mobility, strengthening and balance.  Target 11/02/14   Time 4   Period Weeks   Status Achieved   PT LONG TERM GOAL #7   Title Pt will be able to negotiate at least 4 steps with alternating step pattern with no handrail, independently.  Target 11/02/14   Time 4   Period Weeks   Status Achieved   PT LONG TERM GOAL #8   Title Pt will verbalize plans for transition to community fitness program to address strengthening and endurance needs.  Target 11/02/14   Time 4   Period Weeks   Status Achieved               Plan - 10/17/14 1137    Clinical Impression Statement Pt has met all LTG's.  Planning on continuing community fitness at fitness center at [employer].  Discharge from PT per Amy Marriott,PT.   PT Next Visit Plan Discharge from PT   Consulted and Agree with Plan of Care Patient        Problem List Patient Active Problem List   Diagnosis Date Noted  . Polyneuropathy 09/02/2014  . History of alcohol abuse 09/02/2014  . Edema 09/02/2014  . Neck pain 09/02/2014  . Chronic lower back pain 09/02/2014  . Numbness of foot 08/02/2014  . Hand numbness 08/02/2014  . Ataxic gait 08/02/2014  . Proximal leg weakness 08/02/2014  . Constipation   . Intractable abdominal pain 07/16/2014  . Hypothermia 07/16/2014  . Upper abdominal pain   . Nausea with vomiting 07/06/2014  . Abdominal pain, epigastric 07/06/2014  . Obesity (BMI 30-39.9) 07/06/2014  . Chronic diastolic heart failure 83/81/8403  . Syncope 04/08/2014  . Prolonged Q-T interval on ECG 04/08/2014  . Hypokalemia 04/08/2014  . Alcohol abuse 04/08/2014  . Elevated LFTs 04/08/2014    Narda Bonds 10/17/2014, 11:46 AM  [facility] [address] [place], [place], [address] Phone: [phone]   Fax:  [place], PTA [place] 10/17/2014 11:46 AM Phone: [phone] Fax: [phone]

## 2014-10-17 NOTE — Therapy (Signed)
[facility] [address] [place], [place], [address] Phone: [phone]   Fax:  [phone]  Occupational Therapy Treatment  Patient Details  Name: Ruvi Fullenwider MRN: [id number] Date of Birth: 1985-07-03 Referring Provider:  Lona Kettle, MD  Encounter Date: 10/17/2014      OT End of Session - 10/17/14 0829    Visit Number 10   Number of Visits 17   Date for OT Re-Evaluation 11/02/14   Authorization Type [employer] state PPO   OT Start Time 0805   OT Stop Time 0845   OT Time Calculation (min) 40 min   Activity Tolerance Patient tolerated treatment well   Behavior During Therapy [facility] for tasks assessed/performed      Past Medical History  Diagnosis Date  . Asthma   . GERD (gastroesophageal reflux disease)   . Alcohol abuse   . Environmental allergies   . Headache   . Hypertension   . Syncope and collapse   . Vision abnormalities     Past Surgical History  Procedure Laterality Date  . Tympanostomy tube placement    . Esophagogastroduodenoscopy N/A 07/17/2014    Procedure: ESOPHAGOGASTRODUODENOSCOPY (EGD);  Surgeon: Teena Irani, MD;  Location: [facility];  Service: Endoscopy;  Laterality: N/A;    There were no vitals filed for this visit.  Visit Diagnosis:  Decreased coordination  Muscle weakness (generalized)  Decreased functional activity tolerance      Subjective Assessment - 10/17/14 1157    Subjective  Agrees with plans to d/c   Pertinent History polyneuropathy, possible seizure due to alcohol withdrawal, alcohol abuse   Patient Stated Goals dexterity to left hand and increasing functional use of left hand    Currently in Pain? No/denies       Treatment: Therapist checked progress towards goals(see goals for progress). Pt agrees with plans to discharge today theraputic activities: Functional overhead reaching to copy small peg design with LUE, 2 lbs weight on wrist without rest break. Typing  test: 50 WPM, 93% Quadraped lifting alternate UE/ LE 10 reps, seated lifting hips for increased  core/ UE strength while weightbearing though bilateral UE's. UBE x 5 mins level 6 for conditioning. Pt agrees with plans to d/c.                            OT Short Term Goals - 10/17/14 4010    OT SHORT TERM GOAL #1   Title I with HEP.   Baseline 10/04/14   Time 4   Period Weeks   Status Achieved   OT SHORT TERM GOAL #2   Title Pt will increase LUE grip strength to 25 lbs for increased functional use.   Baseline LUE 50 lbs day of d/c   Time 4   Period Weeks   Status Achieved   OT SHORT TERM GOAL #3   Title Pt will perform all basic ADLS modified independently in a reasonable amount of time.   Baseline met per pt report   Time 4   Period Weeks   Status Achieved   OT SHORT TERM GOAL #4   Title Pt will demonstrate ability to perform light home management functional tasks in standing x 15 mins without LOB / or rest break.   Time 4   Period Weeks   Status Achieved   OT SHORT TERM GOAL #5   Time 4           OT Long  Term Goals - 10/17/14 0806    OT LONG TERM GOAL #1   Title Pt will perform light home management and cooking at a modified independent level in standing   Baseline met per pt report   Status Achieved   OT LONG TERM GOAL #2   Title Pt will report ability to perform functional activities and ADLS in standing for 02-63 mins prior to rest break .   Status Achieved   OT LONG TERM GOAL #3   Title Pt will type at least 25 WPM in prep for work activities, with hand pain less than or equal to 3/10.-( met, 09/13/14 50 WPM, and no pain) revised goal below   Baseline upgrade goal to: pt will type 70WPM, without reports of pain- 56 WPM, 93 % accuracy- initial goal met, upgraded not met   Status Partially Met   OT LONG TERM GOAL #4   Title Pt will use LUE as an active assist for ADLS/IADLS at least 78% of the time.   Baseline met 90%   Status Achieved                Plan - 10/17/14 5885    Clinical Impression Statement Pt.demonstrates excellent overall progress and agrees to discharge.   Pt will benefit from skilled therapeutic intervention in order to improve on the following deficits (Retired) Abnormal gait;Increased edema;Pain;Impaired sensation;Decreased mobility;Decreased coordination;Decreased activity tolerance;Decreased endurance;Decreased range of motion;Decreased strength;Impaired UE functional use;Impaired perceived functional ability;Difficulty walking;Decreased balance   Rehab Potential Good   Clinical Impairments Affecting Rehab Potential see above   OT Frequency 2x / week   OT Duration 8 weeks   OT Treatment/Interventions Self-care/ADL training;Splinting;Neuromuscular education;Parrafin;Moist Heat;Fluidtherapy;Energy conservation;Therapist, nutritional;Therapeutic exercises;Patient/family education;Balance training;Therapeutic activities;Passive range of motion;Manual Therapy;DME and/or AE instruction;Contrast Bath;Ultrasound;Cryotherapy   Plan discharge OT   OT Home Exercise Plan issued:(09/10/14)coordination, putty, red theraband issued 09/13/14, upgraded to green band 09/18/14, upgraded several items to blue 10/01/14   Consulted and Agree with Plan of Care Patient        Problem List Patient Active Problem List   Diagnosis Date Noted  . Polyneuropathy 09/02/2014  . History of alcohol abuse 09/02/2014  . Edema 09/02/2014  . Neck pain 09/02/2014  . Chronic lower back pain 09/02/2014  . Numbness of foot 08/02/2014  . Hand numbness 08/02/2014  . Ataxic gait 08/02/2014  . Proximal leg weakness 08/02/2014  . Constipation   . Intractable abdominal pain 07/16/2014  . Hypothermia 07/16/2014  . Upper abdominal pain   . Nausea with vomiting 07/06/2014  . Abdominal pain, epigastric 07/06/2014  . Obesity (BMI 30-39.9) 07/06/2014  . Chronic diastolic heart failure 02/77/4128  . Syncope 04/08/2014  . Prolonged Q-T  interval on ECG 04/08/2014  . Hypokalemia 04/08/2014  . Alcohol abuse 04/08/2014  . Elevated LFTs 04/08/2014  OCCUPATIONAL THERAPY DISCHARGE SUMMARY    Current functional level related to goals / functional outcomes:Pt met 4/5 Long term goals.(Pt did not fully meet upgraded tying goal for 70 WPM, however pt reports she is able to work on this at home).    Remaining deficits: Decreased strength, mildly decreased coordiantion.   Education / Equipment: Pt made excellent overall progress in strength, endurance and coordination. Pt has joined a gym and she plans to continue to work on typing at home.  Plan: Patient agrees to discharge.  Patient goals were partially met.  meeting the stated rehab goals.  ?????      Marsalis Beaulieu 10/17/2014, 11:58 AM Theone Murdoch, OTR/L [phone] Phone: [phone] 11:58 AM 10/17/2014 [place] [address] [place], [place], [address] Phone: [phone]   Fax:  [phone]

## 2014-10-22 ENCOUNTER — Encounter: Payer: BC Managed Care – PPO | Admitting: Occupational Therapy

## 2014-10-24 ENCOUNTER — Encounter: Payer: BC Managed Care – PPO | Admitting: Occupational Therapy

## 2014-10-29 ENCOUNTER — Encounter: Payer: Self-pay | Admitting: Neurology

## 2014-10-29 ENCOUNTER — Ambulatory Visit (INDEPENDENT_AMBULATORY_CARE_PROVIDER_SITE_OTHER): Payer: BC Managed Care – PPO | Admitting: Neurology

## 2014-10-29 VITALS — BP 128/90 | HR 88 | Resp 14 | Ht 64.0 in | Wt 206.0 lb

## 2014-10-29 DIAGNOSIS — G629 Polyneuropathy, unspecified: Secondary | ICD-10-CM

## 2014-10-29 DIAGNOSIS — G4700 Insomnia, unspecified: Secondary | ICD-10-CM | POA: Insufficient documentation

## 2014-10-29 DIAGNOSIS — R260 Ataxic gait: Secondary | ICD-10-CM | POA: Diagnosis not present

## 2014-10-29 NOTE — Progress Notes (Signed)
[facility]  PATIENT: Kara [name] DOB: 1985-10-23  REFERRING DOCTOR OR PCP:  Gildardo Cranker SOURCE: Patient and EMR / ED records.   Reviewed CT images  _________________________________   HISTORICAL  CHIEF COMPLAINT:  Chief Complaint  Patient presents with  . Gait Disturbance    Sts. gait is much better.  Edema in feet is almost completely resolved.  She feels OT and PT made much of the difference/fim    HISTORY OF PRESENT ILLNESS:  Kara [name] is a 29 year old woman with a history of alcohol abuse who began to notice difficulty with her balance and walking in March, 2016 after a severe gastroenteritis hospitalization.   She lost 40 pounds  She is feeling much better now after starting gabapentin and doing PT.   She has even joined a gym.    She had insomnia which is doing much better with gabapentin.   Back and neck pain is doing better also.  Her leg swelling is doing better and she feels back to baseline.      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 There is right greater than left foraminal narrowing. There is no spinal cord abnormality noted.   MRI of the  brain was normal.     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    She is a recovering alcoholic and used to drink several shots of whiskey nightly.  She does not drink much anymore.    REVIEW OF SYSTEMS: Constitutional: No fevers, chills, sweats, or change in appetite Eyes: No visual changes, double vision, eye pain Ear, nose and throat: No hearing loss, ear pain, nasal congestion, sore throat Cardiovascular: No chest pain, palpitations Respiratory: No shortness of breath at rest or with exertion.   No wheezes GastrointestinaI: as above Genitourinary: No dysuria, urinary retention or frequency.  No nocturia. Musculoskeletal: No neck pain, back pain Integumentary: No rash, pruritus, skin lesions Neurological: as above Psychiatric:  No depression at this time.  Some anxiety Endocrine: No palpitations, diaphoresis, change in appetite, change in weigh or increased thirst Hematologic/Lymphatic: No anemia, purpura, petechiae. Allergic/Immunologic: No itchy/runny eyes, nasal congestion, recent allergic reactions, rashes  ALLERGIES: Allergies  Allergen Reactions  . Peanuts [Peanut Oil] Itching    Sensitivity     HOME MEDICATIONS:  Current outpatient prescriptions:  .  albuterol (PROVENTIL HFA;VENTOLIN HFA) 108 (90 BASE) MCG/ACT inhaler, Inhale 2 puffs into the lungs every 6 (six) hours as needed for wheezing or shortness of breath (wheezing and sob). , Disp: , Rfl:  .  ALPRAZolam (XANAX) 0.25 MG tablet, Take 1 tablet (0.25 mg total) by mouth 2 (two) times daily as needed for anxiety. (Patient taking differently: Take 0.25 mg by mouth daily. ), Disp: 60 tablet, Rfl: 3 .  chlorhexidine (PERIDEX) 0.12 % solution, Use as directed 10 mLs in the mouth or throat daily., Disp: , Rfl:  .  fexofenadine (ALLEGRA) 180 MG tablet, Take 180 mg by mouth daily as needed for allergies (allergies). , Disp: , Rfl:  .  gabapentin (NEURONTIN) 300 MG capsule, One in am, one in evening and two po at night, Disp: 120 capsule, Rfl: 11 .  metoprolol succinate (TOPROL-XL) 25 MG 24 hr tablet, Take 1 tablet (25 mg total) by mouth daily., Disp: 30 tablet, Rfl: 11 .  mometasone-formoterol (DULERA) 200-5 MCG/ACT AERO, Inhale 2 puffs into the lungs 2 (two) times daily., Disp: , Rfl:  .  montelukast (SINGULAIR) 10 MG tablet, Take  10 mg by mouth daily. , Disp: , Rfl:  .  Multiple Vitamin (MULTIVITAMIN WITH MINERALS) TABS tablet, Take 1 tablet by mouth daily., Disp: 30 tablet, Rfl: 0 .  pantoprazole (PROTONIX) 40 MG tablet, Take 1 tablet (40 mg total) by mouth 2 (two) times daily. (Patient not taking: Reported on 10/29/2014), Disp: 60 tablet, Rfl: 0 .  polyethylene glycol (MIRALAX / GLYCOLAX) packet, Take 17 g by mouth daily as needed. (Patient not taking:  Reported on 10/29/2014), Disp: 14 each, Rfl: 0 .  sucralfate (CARAFATE) 1 GM/10ML suspension, Take 10 mLs (1 g total) by mouth 4 (four) times daily -  with meals and at bedtime. (Patient not taking: Reported on 10/29/2014), Disp: 420 mL, Rfl: 0 .  traMADol (ULTRAM) 50 MG tablet, Take 1 tablet (50 mg total) by mouth 3 (three) times daily as needed. (Patient not taking: Reported on 10/29/2014), Disp: 90 tablet, Rfl: 0  PAST MEDICAL HISTORY: Past Medical History  Diagnosis Date  . Asthma   . GERD (gastroesophageal reflux disease)   . Alcohol abuse   . Environmental allergies   . Headache   . Hypertension   . Syncope and collapse   . Vision abnormalities     PAST SURGICAL HISTORY: Past Surgical History  Procedure Laterality Date  . Tympanostomy tube placement    . Esophagogastroduodenoscopy N/A 07/17/2014    Procedure: ESOPHAGOGASTRODUODENOSCOPY (EGD);  Surgeon: Dorena Cookey, MD;  Location: [facility];  Service: Endoscopy;  Laterality: N/A;    FAMILY HISTORY: Family History  Problem Relation Age of Onset  . Heart failure Mother   . Heart failure Father   . Heart failure Maternal Grandfather     SOCIAL HISTORY:  History   Social History  . Marital Status: Married    Spouse Name: [name]  . Number of Children: 0  . Years of Education: N/A   Occupational History  . College advisor    Social History Main Topics  . Smoking status: Former Smoker -- 0.50 packs/day for 2 years    Types: Cigarettes    Quit date: 04/08/2012  . Smokeless tobacco: Never Used  . Alcohol Use: 0.0 oz/week    0 Standard drinks or equivalent per week     Comment: ETOH abuse, 8 shots/ day  . Drug Use: No  . Sexual Activity: Yes    Birth Control/ Protection: None   Other Topics Concern  . Not on file   Social History Narrative   Married.     PHYSICAL EXAM  Filed Vitals:   10/29/14 1449  BP: 128/90  Pulse: 88  Resp: 14  Height: 5\' 4"  (1.626 m)  Weight: 206 lb (93.441 kg)    Body  mass index is 35.34 kg/(m^2).   General: The patient is well-developed and well-nourished and in no acute distress  Neck:  The neck is nontender.  Musculoskeletal:  Back is nontender.   Neurologic Exam  Mental status: The patient is alert and oriented x 3 at the time of the examination. The patient has apparent normal recent and remote memory, with an apparently normal attention span and concentration ability.   Speech is normal.  Cranial nerves: Extraocular movements are full.  .Facial strength is normal.  Trapezius and sternocleidomastoid strength is normal. No dysarthria is noted.    Motor:  Muscle bulk is normal.   Tone is normal. Strength is  5 / 5 in all 4 extremities.   Sensory: Sensory testing is intact to pinprick, soft touch and vibration sensation  in all 4 limbs now  Gait and station: Station is normal.   Gait is normal. Tandem gait is now normal.   Reflexes: Deep tendon reflexes are symmetric and normal bilaterally.      DIAGNOSTIC DATA (LABS, IMAGING, TESTING) - I reviewed patient records, labs, notes, testing and imaging myself where available.  Lab Results  Component Value Date   WBC 8.3 07/27/2014   HGB 12.7 07/27/2014   HCT 39.1 07/27/2014   MCV 96.3 07/27/2014   PLT 390 07/27/2014      Component Value Date/Time   NA 142 09/02/2014 1507   NA 136 07/27/2014 1733   K 4.4 09/02/2014 1507   CL 98 09/02/2014 1507   CO2 22 09/02/2014 1507   GLUCOSE 103* 09/02/2014 1507   GLUCOSE 117* 07/27/2014 1733   BUN 7 09/02/2014 1507   BUN 9 07/27/2014 1733   CREATININE 0.66 09/02/2014 1507   CALCIUM 10.4* 09/02/2014 1507   PROT 7.6 09/02/2014 1507   PROT 7.0 07/27/2014 1733   ALBUMIN 3.5 07/27/2014 1733   AST 48* 09/02/2014 1507   ALT 45* 09/02/2014 1507   ALKPHOS 114 09/02/2014 1507   BILITOT 0.7 09/02/2014 1507   BILITOT 0.7 07/27/2014 1733   GFRNONAA 119 09/02/2014 1507   GFRAA 137 09/02/2014 1507    Lab Results  Component Value Date   VITAMINB12 632  09/02/2014   Lab Results  Component Value Date   TSH 1.303 07/16/2014       ASSESSMENT AND PLAN  Ataxic gait  Polyneuropathy  Insomnia  1.   Continue to be active and exercises tolerated. 2.  Stop afternoon gabapentin.  Since the gabapentin is helping her sleep, she can continue the nighttime dose if she wants. I recommend that she do a trial off gabapentin a few nights to see if it is actually making a significant difference. 3.  She will return to see me as needed. She should call for any new or worsening neurologic symptoms.   Richard A. Epimenio FootSater, MD, PhD 10/29/2014, 2:58 PM Certified in Neurology, Clinical Neurophysiology, Sleep Medicine, Pain Medicine and Neuroimaging  [facility] [address], [address] [place], [place] [address] [phone]  f f

## 2014-12-31 ENCOUNTER — Other Ambulatory Visit: Payer: Self-pay | Admitting: Cardiovascular Disease

## 2015-01-01 ENCOUNTER — Telehealth: Payer: Self-pay

## 2015-01-01 NOTE — Telephone Encounter (Signed)
Pt is requesting a refill on XANAX.

## 2015-01-01 NOTE — Telephone Encounter (Signed)
Primary should do this not [place]

## 2015-01-02 NOTE — Telephone Encounter (Signed)
LM TO CALL BACK ./CY 

## 2015-01-03 NOTE — Telephone Encounter (Signed)
LM TO CALL BACK ./CY 

## 2015-01-06 NOTE — Telephone Encounter (Signed)
LM  ONCE AGAIN WILL AWAIT RETURN CALL FROM PT .[name]

## 2015-01-29 ENCOUNTER — Encounter (HOSPITAL_COMMUNITY): Payer: Self-pay

## 2015-01-29 ENCOUNTER — Inpatient Hospital Stay (HOSPITAL_COMMUNITY)
Admission: EM | Admit: 2015-01-29 | Discharge: 2015-02-09 | DRG: 871 | Disposition: A | Discharge status: Home / Self Care | Payer: BC Managed Care – PPO | Attending: Internal Medicine | Admitting: Internal Medicine

## 2015-01-29 ENCOUNTER — Emergency Department (HOSPITAL_COMMUNITY): Payer: BC Managed Care – PPO

## 2015-01-29 DIAGNOSIS — Z87891 Personal history of nicotine dependence: Secondary | ICD-10-CM

## 2015-01-29 DIAGNOSIS — K7011 Alcoholic hepatitis with ascites: Secondary | ICD-10-CM | POA: Diagnosis present

## 2015-01-29 DIAGNOSIS — K859 Acute pancreatitis without necrosis or infection, unspecified: Secondary | ICD-10-CM

## 2015-01-29 DIAGNOSIS — D72829 Elevated white blood cell count, unspecified: Secondary | ICD-10-CM

## 2015-01-29 DIAGNOSIS — K8501 Idiopathic acute pancreatitis with uninfected necrosis: Secondary | ICD-10-CM | POA: Diagnosis not present

## 2015-01-29 DIAGNOSIS — J189 Pneumonia, unspecified organism: Secondary | ICD-10-CM

## 2015-01-29 DIAGNOSIS — J45909 Unspecified asthma, uncomplicated: Secondary | ICD-10-CM | POA: Diagnosis present

## 2015-01-29 DIAGNOSIS — Z8711 Personal history of peptic ulcer disease: Secondary | ICD-10-CM

## 2015-01-29 DIAGNOSIS — Z86718 Personal history of other venous thrombosis and embolism: Secondary | ICD-10-CM

## 2015-01-29 DIAGNOSIS — D62 Acute posthemorrhagic anemia: Secondary | ICD-10-CM | POA: Diagnosis not present

## 2015-01-29 DIAGNOSIS — I5032 Chronic diastolic (congestive) heart failure: Secondary | ICD-10-CM | POA: Diagnosis present

## 2015-01-29 DIAGNOSIS — E876 Hypokalemia: Secondary | ICD-10-CM | POA: Diagnosis not present

## 2015-01-29 DIAGNOSIS — F1010 Alcohol abuse, uncomplicated: Secondary | ICD-10-CM | POA: Diagnosis present

## 2015-01-29 DIAGNOSIS — K625 Hemorrhage of anus and rectum: Secondary | ICD-10-CM | POA: Diagnosis present

## 2015-01-29 DIAGNOSIS — I82890 Acute embolism and thrombosis of other specified veins: Secondary | ICD-10-CM | POA: Insufficient documentation

## 2015-01-29 DIAGNOSIS — A419 Sepsis, unspecified organism: Secondary | ICD-10-CM | POA: Diagnosis present

## 2015-01-29 DIAGNOSIS — N179 Acute kidney failure, unspecified: Secondary | ICD-10-CM

## 2015-01-29 DIAGNOSIS — Z6841 Body Mass Index (BMI) 40.0 and over, adult: Secondary | ICD-10-CM | POA: Diagnosis not present

## 2015-01-29 DIAGNOSIS — R609 Edema, unspecified: Secondary | ICD-10-CM | POA: Diagnosis present

## 2015-01-29 DIAGNOSIS — K661 Hemoperitoneum: Secondary | ICD-10-CM | POA: Diagnosis present

## 2015-01-29 DIAGNOSIS — F10188 Alcohol abuse with other alcohol-induced disorder: Secondary | ICD-10-CM | POA: Diagnosis present

## 2015-01-29 DIAGNOSIS — T45515A Adverse effect of anticoagulants, initial encounter: Secondary | ICD-10-CM | POA: Diagnosis present

## 2015-01-29 DIAGNOSIS — R1110 Vomiting, unspecified: Secondary | ICD-10-CM | POA: Diagnosis present

## 2015-01-29 DIAGNOSIS — I10 Essential (primary) hypertension: Secondary | ICD-10-CM | POA: Diagnosis present

## 2015-01-29 DIAGNOSIS — J9600 Acute respiratory failure, unspecified whether with hypoxia or hypercapnia: Secondary | ICD-10-CM | POA: Diagnosis not present

## 2015-01-29 DIAGNOSIS — R571 Hypovolemic shock: Secondary | ICD-10-CM | POA: Diagnosis not present

## 2015-01-29 DIAGNOSIS — K852 Alcohol induced acute pancreatitis without necrosis or infection: Secondary | ICD-10-CM | POA: Diagnosis present

## 2015-01-29 DIAGNOSIS — D735 Infarction of spleen: Secondary | ICD-10-CM | POA: Diagnosis present

## 2015-01-29 DIAGNOSIS — R7989 Other specified abnormal findings of blood chemistry: Secondary | ICD-10-CM | POA: Diagnosis present

## 2015-01-29 DIAGNOSIS — Z8249 Family history of ischemic heart disease and other diseases of the circulatory system: Secondary | ICD-10-CM

## 2015-01-29 DIAGNOSIS — R000 Tachycardia, unspecified: Secondary | ICD-10-CM

## 2015-01-29 DIAGNOSIS — E46 Unspecified protein-calorie malnutrition: Secondary | ICD-10-CM | POA: Diagnosis present

## 2015-01-29 DIAGNOSIS — K863 Pseudocyst of pancreas: Secondary | ICD-10-CM | POA: Diagnosis present

## 2015-01-29 DIAGNOSIS — I81 Portal vein thrombosis: Secondary | ICD-10-CM | POA: Diagnosis present

## 2015-01-29 DIAGNOSIS — K219 Gastro-esophageal reflux disease without esophagitis: Secondary | ICD-10-CM | POA: Diagnosis present

## 2015-01-29 DIAGNOSIS — E669 Obesity, unspecified: Secondary | ICD-10-CM | POA: Diagnosis present

## 2015-01-29 DIAGNOSIS — R945 Abnormal results of liver function studies: Secondary | ICD-10-CM

## 2015-01-29 HISTORY — DX: Personal history of other venous thrombosis and embolism: Z86.718

## 2015-01-29 HISTORY — DX: Long QT syndrome: I45.81

## 2015-01-29 LAB — CBC WITH DIFFERENTIAL/PLATELET
Basophils Absolute: 0.1 10*3/uL (ref 0.0–0.1)
Basophils Relative: 0 %
EOS PCT: 0 %
Eosinophils Absolute: 0 10*3/uL (ref 0.0–0.7)
HCT: 49 % — ABNORMAL HIGH (ref 36.0–46.0)
Hemoglobin: 16.5 g/dL — ABNORMAL HIGH (ref 12.0–15.0)
LYMPHS ABS: 0.9 10*3/uL (ref 0.7–4.0)
Lymphocytes Relative: 4 %
MCH: 33.1 pg (ref 26.0–34.0)
MCHC: 33.7 g/dL (ref 30.0–36.0)
MCV: 98.2 fL (ref 78.0–100.0)
Monocytes Absolute: 0.8 10*3/uL (ref 0.1–1.0)
Monocytes Relative: 4 %
Neutro Abs: 19.6 10*3/uL — ABNORMAL HIGH (ref 1.7–7.7)
Neutrophils Relative %: 92 %
PLATELETS: 295 10*3/uL (ref 150–400)
RBC: 4.99 MIL/uL (ref 3.87–5.11)
RDW: 13.9 % (ref 11.5–15.5)
WBC: 21.4 10*3/uL — ABNORMAL HIGH (ref 4.0–10.5)

## 2015-01-29 LAB — COMPREHENSIVE METABOLIC PANEL
ALK PHOS: 173 U/L — AB (ref 38–126)
ALT: 132 U/L — ABNORMAL HIGH (ref 14–54)
ANION GAP: 18 — AB (ref 5–15)
AST: 304 U/L — ABNORMAL HIGH (ref 15–41)
Albumin: 3.7 g/dL (ref 3.5–5.0)
BILIRUBIN TOTAL: 2.6 mg/dL — AB (ref 0.3–1.2)
BUN: 9 mg/dL (ref 6–20)
CALCIUM: 9.1 mg/dL (ref 8.9–10.3)
CO2: 20 mmol/L — ABNORMAL LOW (ref 22–32)
Chloride: 100 mmol/L — ABNORMAL LOW (ref 101–111)
Creatinine, Ser: 1.06 mg/dL — ABNORMAL HIGH (ref 0.44–1.00)
GFR calc non Af Amer: >60 mL/min (ref >60)
Glucose, Bld: 149 mg/dL — ABNORMAL HIGH (ref 65–99)
Potassium: 3.8 mmol/L (ref 3.5–5.1)
Sodium: 138 mmol/L (ref 135–145)
Total Protein: 8.1 g/dL (ref 6.5–8.1)

## 2015-01-29 LAB — ETHANOL

## 2015-01-29 LAB — I-STAT BETA HCG BLOOD, ED (MC, WL, AP ONLY)

## 2015-01-29 LAB — LIPASE, BLOOD: LIPASE: 1069 U/L — AB (ref 22–51)

## 2015-01-29 MED ORDER — HYDROMORPHONE 0.3 MG/ML IV SOLN
INTRAVENOUS | Status: DC
  Administered 2015-01-29: 17:00:00 via INTRAVENOUS
  Administered 2015-01-29: 1.09 mg via INTRAVENOUS
  Administered 2015-01-29: 1 mg via INTRAVENOUS
  Administered 2015-01-30: 0.2 mg via INTRAVENOUS
  Administered 2015-01-30: 0.799 mg via INTRAVENOUS
  Administered 2015-01-30: 0.999 mg via INTRAVENOUS
  Administered 2015-01-30: 0.599 mg via INTRAVENOUS
  Administered 2015-01-30: 0.8 mg via INTRAVENOUS
  Administered 2015-01-30: 21:00:00 via INTRAVENOUS
  Administered 2015-01-31: 1.59 mg via INTRAVENOUS
  Administered 2015-01-31: 0.99 mg via INTRAVENOUS
  Administered 2015-01-31: 1.19 mg via INTRAVENOUS
  Filled 2015-01-29 (×2): qty 25

## 2015-01-29 MED ORDER — SODIUM CHLORIDE 0.9 % IV SOLN
500.0000 mg | INTRAVENOUS | Status: AC
  Administered 2015-01-29: 500 mg via INTRAVENOUS
  Filled 2015-01-29: qty 500

## 2015-01-29 MED ORDER — ADULT MULTIVITAMIN W/MINERALS CH
1.0000 | ORAL_TABLET | Freq: Every day | ORAL | Status: DC
  Administered 2015-01-29 – 2015-02-09 (×11): 1 via ORAL
  Filled 2015-01-29 (×12): qty 1

## 2015-01-29 MED ORDER — HEPARIN SODIUM (PORCINE) 5000 UNIT/ML IJ SOLN
5000.0000 [IU] | Freq: Three times a day (TID) | INTRAMUSCULAR | Status: DC
  Administered 2015-01-29 – 2015-02-03 (×16): 5000 [IU] via SUBCUTANEOUS
  Filled 2015-01-29 (×18): qty 1

## 2015-01-29 MED ORDER — THIAMINE HCL 100 MG/ML IJ SOLN
100.0000 mg | Freq: Every day | INTRAMUSCULAR | Status: DC
  Administered 2015-01-30: 100 mg via INTRAVENOUS
  Filled 2015-01-29 (×9): qty 1

## 2015-01-29 MED ORDER — METOPROLOL SUCCINATE ER 25 MG PO TB24
25.0000 mg | ORAL_TABLET | Freq: Every day | ORAL | Status: DC
  Administered 2015-01-29 – 2015-02-09 (×11): 25 mg via ORAL
  Filled 2015-01-29 (×12): qty 1

## 2015-01-29 MED ORDER — ONDANSETRON HCL 4 MG PO TABS: 4.0000 mg | ORAL_TABLET | Freq: Four times a day (QID) | ORAL | Status: DC | PRN

## 2015-01-29 MED ORDER — ONDANSETRON HCL 4 MG/2ML IJ SOLN: 4.0000 mg | Freq: Four times a day (QID) | INTRAMUSCULAR | Status: DC | PRN

## 2015-01-29 MED ORDER — FOLIC ACID 1 MG PO TABS
1.0000 mg | ORAL_TABLET | Freq: Every day | ORAL | Status: DC
  Administered 2015-01-29 – 2015-02-09 (×11): 1 mg via ORAL
  Filled 2015-01-29 (×12): qty 1

## 2015-01-29 MED ORDER — MOMETASONE FURO-FORMOTEROL FUM 200-5 MCG/ACT IN AERO
2.0000 | INHALATION_SPRAY | Freq: Two times a day (BID) | RESPIRATORY_TRACT | Status: DC
  Administered 2015-01-29 – 2015-02-09 (×22): 2 via RESPIRATORY_TRACT
  Filled 2015-01-29: qty 8.8

## 2015-01-29 MED ORDER — LORAZEPAM 2 MG/ML IJ SOLN
1.0000 mg | Freq: Four times a day (QID) | INTRAMUSCULAR | Status: AC | PRN
  Administered 2015-01-30: 1 mg via INTRAVENOUS
  Filled 2015-01-29: qty 1

## 2015-01-29 MED ORDER — SODIUM CHLORIDE 0.9 % IJ SOLN: 9.0000 mL | INTRAMUSCULAR | Status: DC | PRN

## 2015-01-29 MED ORDER — DIPHENHYDRAMINE HCL 12.5 MG/5ML PO ELIX: 12.5000 mg | ORAL_SOLUTION | Freq: Four times a day (QID) | ORAL | Status: DC | PRN

## 2015-01-29 MED ORDER — NALOXONE HCL 0.4 MG/ML IJ SOLN: 0.4000 mg | INTRAMUSCULAR | Status: DC | PRN

## 2015-01-29 MED ORDER — SODIUM CHLORIDE 0.9 % IV SOLN
1000.0000 mL | Freq: Once | INTRAVENOUS | Status: AC
  Administered 2015-01-29: 1000 mL via INTRAVENOUS

## 2015-01-29 MED ORDER — LORAZEPAM 1 MG PO TABS
1.0000 mg | ORAL_TABLET | Freq: Four times a day (QID) | ORAL | Status: AC | PRN
  Administered 2015-01-30: 1 mg via ORAL
  Filled 2015-01-29: qty 1

## 2015-01-29 MED ORDER — LORAZEPAM 2 MG/ML IJ SOLN
0.0000 mg | Freq: Two times a day (BID) | INTRAMUSCULAR | Status: AC
  Administered 2015-01-31: 2 mg via INTRAVENOUS
  Filled 2015-01-29 (×2): qty 1

## 2015-01-29 MED ORDER — LORAZEPAM 2 MG/ML IJ SOLN: 0.0000 mg | Freq: Two times a day (BID) | INTRAMUSCULAR | Status: DC

## 2015-01-29 MED ORDER — ONDANSETRON HCL 4 MG/2ML IJ SOLN
4.0000 mg | Freq: Once | INTRAMUSCULAR | Status: AC
  Administered 2015-01-29: 4 mg via INTRAVENOUS
  Filled 2015-01-29: qty 2

## 2015-01-29 MED ORDER — KETOROLAC TROMETHAMINE 30 MG/ML IJ SOLN
30.0000 mg | Freq: Once | INTRAMUSCULAR | Status: AC
  Administered 2015-01-29: 30 mg via INTRAVENOUS
  Filled 2015-01-29: qty 1

## 2015-01-29 MED ORDER — ONDANSETRON HCL 4 MG/2ML IJ SOLN
4.0000 mg | Freq: Four times a day (QID) | INTRAMUSCULAR | Status: DC | PRN
  Filled 2015-01-29: qty 2

## 2015-01-29 MED ORDER — SODIUM CHLORIDE 0.9 % IV SOLN
500.0000 mg | Freq: Four times a day (QID) | INTRAVENOUS | Status: DC
  Administered 2015-01-29 – 2015-02-07 (×34): 500 mg via INTRAVENOUS
  Filled 2015-01-29 (×35): qty 500

## 2015-01-29 MED ORDER — GABAPENTIN 300 MG PO CAPS
300.0000 mg | ORAL_CAPSULE | Freq: Every day | ORAL | Status: DC
  Administered 2015-01-29 – 2015-02-08 (×11): 300 mg via ORAL
  Filled 2015-01-29 (×12): qty 1

## 2015-01-29 MED ORDER — LORAZEPAM 2 MG/ML IJ SOLN
0.0000 mg | Freq: Four times a day (QID) | INTRAMUSCULAR | Status: DC
  Administered 2015-01-29: 2 mg via INTRAVENOUS
  Filled 2015-01-29: qty 1

## 2015-01-29 MED ORDER — SODIUM CHLORIDE 0.9 % IV BOLUS (SEPSIS)
1000.0000 mL | Freq: Once | INTRAVENOUS | Status: AC
  Administered 2015-01-29: 1000 mL via INTRAVENOUS

## 2015-01-29 MED ORDER — MONTELUKAST SODIUM 10 MG PO TABS
10.0000 mg | ORAL_TABLET | Freq: Every day | ORAL | Status: DC
  Administered 2015-01-29 – 2015-02-09 (×11): 10 mg via ORAL
  Filled 2015-01-29 (×12): qty 1

## 2015-01-29 MED ORDER — HYDROMORPHONE HCL 1 MG/ML IJ SOLN
1.0000 mg | Freq: Once | INTRAMUSCULAR | Status: AC
  Administered 2015-01-29: 1 mg via INTRAVENOUS
  Filled 2015-01-29: qty 1

## 2015-01-29 MED ORDER — SODIUM CHLORIDE 0.9 % IV SOLN
INTRAVENOUS | Status: AC
  Administered 2015-01-29 – 2015-01-30 (×2): via INTRAVENOUS

## 2015-01-29 MED ORDER — SODIUM CHLORIDE 0.9 % IV SOLN: 1000.0000 mL | INTRAVENOUS | Status: DC

## 2015-01-29 MED ORDER — DIPHENHYDRAMINE HCL 50 MG/ML IJ SOLN: 12.5000 mg | Freq: Four times a day (QID) | INTRAMUSCULAR | Status: DC | PRN

## 2015-01-29 MED ORDER — VITAMIN B-1 100 MG PO TABS
100.0000 mg | ORAL_TABLET | Freq: Every day | ORAL | Status: DC
  Administered 2015-01-29 – 2015-02-09 (×10): 100 mg via ORAL
  Filled 2015-01-29 (×12): qty 1

## 2015-01-29 MED ORDER — POLYETHYLENE GLYCOL 3350 17 G PO PACK: 17.0000 g | PACK | Freq: Every day | ORAL | Status: DC | PRN

## 2015-01-29 MED ORDER — LORAZEPAM 2 MG/ML IJ SOLN
0.0000 mg | Freq: Four times a day (QID) | INTRAMUSCULAR | Status: AC
  Administered 2015-01-29: 2 mg via INTRAVENOUS
  Administered 2015-01-30: 1 mg via INTRAVENOUS
  Administered 2015-01-30: 2 mg via INTRAVENOUS
  Filled 2015-01-29 (×3): qty 1

## 2015-01-29 MED ORDER — CHLORHEXIDINE GLUCONATE 0.12 % MT SOLN
15.0000 mL | Freq: Two times a day (BID) | OROMUCOSAL | Status: DC
  Administered 2015-01-29 – 2015-02-08 (×20): 15 mL via OROMUCOSAL
  Filled 2015-01-29 (×23): qty 15

## 2015-01-29 NOTE — ED Notes (Signed)
Bed: [id number] Expected date:  Expected time:  Means of arrival:  Comments: EMS- 29yo F, n/v

## 2015-01-29 NOTE — Progress Notes (Signed)
ANTIBIOTIC CONSULT NOTE - INITIAL  Pharmacy Consult for Primaxin Indication: Intra-abdominal infection  Allergies  Allergen Reactions  . Peanuts [Peanut Oil] Itching    Sensitivity     Patient Measurements:   Adjusted Body Weight:   Vital Signs: Temp: 98.2 F (36.8 C) (10/12 1545) Temp Source: Oral (10/12 1234) BP: 133/97 mmHg (10/12 1545) Pulse Rate: 118 (10/12 1545) Intake/Output from previous day:   Intake/Output from this shift:    Labs:  Recent Labs  01/29/15 1102  WBC 21.4*  HGB 16.5*  PLT 295  CREATININE 1.06*   CrCl cannot be calculated (Unknown ideal weight.). No results for input(s): VANCOTROUGH, VANCOPEAK, VANCORANDOM, GENTTROUGH, GENTPEAK, GENTRANDOM, TOBRATROUGH, TOBRAPEAK, TOBRARND, AMIKACINPEAK, AMIKACINTROU, AMIKACIN in the last 72 hours.   Microbiology: No results found for this or any previous visit (from the past 720 hour(s)).  Medical History: Past Medical History  Diagnosis Date  . Asthma   . GERD (gastroesophageal reflux disease)   . Alcohol abuse   . Environmental allergies   . Headache   . Hypertension   . Syncope and collapse   . Vision abnormalities   . Prolonged QT syndrome    Assessment: 29 yoF presents with left flank pain and vomiting, found to have acute pancreatitis secondary to alcohol abuse with extensive peripancreatic fluid extending into LUQ and down left pericolic. Pharmacy consulted to start primaxin for intra-abdominal infection.    Anti-infectives 10/12 >> Primaxin  >>  Vitals/Labs WBC: Elevated 21.4K Tm24h: Afebrile SCr: 1.06, CrCl 88 CG/N  Cultures None ordered  Goal of Therapy:  Eradication of infection  Plan:  Primaxin 500mg  IV q6h F/u renal function, cultures, clinical course  Haynes Hoehnolleen Kaleth Koy, PharmD, BCPS 01/29/2015, 4:33 PM  Pager: [phone]

## 2015-01-29 NOTE — H&P (Signed)
[facility] History and Physical     History and Physical:    Sharalyn Lomba   VFI:[id number] DOB: 1985/11/17 DOA: 01/29/2015  Referring MD/provider: Dr. Broadus John PCP:  Duane Lope, MD   Chief Complaint:   History of Present Illness:   Tucker Steedley is an 29 y.o. female past medical history of hypertension social drinker which usually drinks over the weekend hard liquor regularly that comes in she started having left flank pain 24 hours prior to admission. She relates is progressively been getting worse she has not been able to tolerate anything by mouth not even water. She has not been able to take her medication. She relates she has been vomiting since then not able to keep anything down. She denies any burning when she urinates any change in her urine, she relates her last drink was on Monday. She denies any diarrhea any urinary symptoms.  In the ED: She is found to have a lipase of greater than 1000 mildly elevated anus AST and ALT with a split of 2-1 mildly elevated bilirubin of 2.6 a white count of 21 with a left shift CT scan of the abdomen and pelvis was done that showed acute pancreatitis extensive peripancreatic fluid extending into the left upper quadrant and down the left pericolic  ROS:   ROS  Constitutional: No fever, no chills;  Appetite normal; No weight loss, no weight gain, no fatigue.   HEENT: No blurry vision, no diplopia, no pharyngitis, no dysphagia  CV: No chest pain, no palpitations, no PND, no orthopnea, no edema.   Resp: No SOB, no cough, no pleuritic pain.  GI: No  diarrhea, no melena, no hematochezia, no constipation, no abdominal pain.   GU: No dysuria, no hematuria, no frequency, no urgency.  MSK: No myalgias, no arthralgias.   Neuro:  No headache, no focal neurological deficits, no history of seizures.   Psych: No depression, no anxiety.   Endo: No heat intolerance, no cold intolerance, no polyuria, no polydipsia   Skin: No rashes, no skin  lesions.   Heme: No easy bruising.   Travel history: No recent travel.   Past Medical History:   Past Medical History  Diagnosis Date  . Asthma   . GERD (gastroesophageal reflux disease)   . Alcohol abuse   . Environmental allergies   . Headache   . Hypertension   . Syncope and collapse   . Vision abnormalities   . Prolonged QT syndrome     Past Surgical History:   Past Surgical History  Procedure Laterality Date  . Tympanostomy tube placement    . Esophagogastroduodenoscopy N/A 07/17/2014    Procedure: ESOPHAGOGASTRODUODENOSCOPY (EGD);  Surgeon: Dorena Cookey, MD;  Location: [facility];  Service: Endoscopy;  Laterality: N/A;    Social History:   Social History   Social History  . Marital Status: Married    Spouse Name: [name]  . Number of Children: 0  . Years of Education: N/A   Occupational History  . College advisor    Social History Main Topics  . Smoking status: Former Smoker -- 0.50 packs/day for 2 years    Types: Cigarettes    Quit date: 04/08/2012  . Smokeless tobacco: Never Used  . Alcohol Use: 1.8 oz/week    0 Standard drinks or equivalent, 3 Shots of liquor per week     Comment: occasionally  . Drug Use: No  . Sexual Activity: Yes    Birth Control/ Protection: None   Other Topics Concern  .  Not on file   Social History Narrative   Married.    Family history:   Family History  Problem Relation Age of Onset  . Heart failure Mother   . Heart failure Father   . Heart failure Maternal Grandfather     Allergies   Peanuts  Current Medications:   Prior to Admission medications   Medication Sig Start Date End Date Taking? Authorizing Provider  albuterol (PROVENTIL HFA;VENTOLIN HFA) 108 (90 BASE) MCG/ACT inhaler Inhale 2 puffs into the lungs every 6 (six) hours as needed for wheezing or shortness of breath (wheezing and sob).    Yes Historical Provider, MD  fexofenadine (ALLEGRA) 180 MG tablet Take 180 mg by mouth daily as needed for  allergies (allergies).    Yes Historical Provider, MD  gabapentin (NEURONTIN) 300 MG capsule One in am, one in evening and two po at night 09/02/14  Yes Asa Lente, MD  metoprolol succinate (TOPROL-XL) 25 MG 24 hr tablet Take 1 tablet (25 mg total) by mouth daily. 06/17/14  Yes Wendall Stade, MD  mometasone-formoterol [facility]) 200-5 MCG/ACT AERO Inhale 2 puffs into the lungs 2 (two) times daily.   Yes Historical Provider, MD  montelukast (SINGULAIR) 10 MG tablet Take 10 mg by mouth daily.    Yes Historical Provider, MD  Multiple Vitamin (MULTIVITAMIN WITH MINERALS) TABS tablet Take 1 tablet by mouth daily. 07/19/14  Yes Belkys A Regalado, MD  ALPRAZolam (XANAX) 0.25 MG tablet Take 1 tablet (0.25 mg total) by mouth 2 (two) times daily as needed for anxiety. Patient not taking: Reported on 01/29/2015 06/11/14   Wendall Stade, MD  pantoprazole (PROTONIX) 40 MG tablet Take 1 tablet (40 mg total) by mouth 2 (two) times daily. Patient not taking: Reported on 10/29/2014 07/19/14   Belkys A Regalado, MD  polyethylene glycol (MIRALAX / GLYCOLAX) packet Take 17 g by mouth daily as needed. Patient not taking: Reported on 10/29/2014 07/19/14   Belkys A Regalado, MD  sucralfate (CARAFATE) 1 GM/10ML suspension Take 10 mLs (1 g total) by mouth 4 (four) times daily -  with meals and at bedtime. Patient not taking: Reported on 10/29/2014 07/19/14   Belkys A Regalado, MD  traMADol (ULTRAM) 50 MG tablet Take 1 tablet (50 mg total) by mouth 3 (three) times daily as needed. Patient not taking: Reported on 10/29/2014 08/27/14   Asa Lente, MD    Physical Exam:   Filed Vitals:   01/29/15 1000 01/29/15 1234  BP: 132/99 121/87  Pulse: 148 136  Temp: 99.1 F (37.3 C) 98.3 F (36.8 C)  TempSrc: Oral Oral  Resp: 20 22  SpO2: 100% 100%     Physical Exam: Blood pressure 121/87, pulse 136, temperature 98.3 F (36.8 C), temperature source Oral, resp. rate 22, last menstrual period 12/30/2014, SpO2 100 %. Gen: No  acute distress.Laying comfortably in bed.  Head: Normocephalic, atraumatic. Eyes: PERRL, EOMI, sclerae nonicteric. Mouth: Oropharynx Neck: Supple, no thyromegaly, no lymphadenopathy, no jugular venous distention. Chest: Good air movement clear to auscultation  CV: Urinary tachycardic no murmurs rubs gallops  Abdomen: Soft, nontender, nondistended with normal active bowel sounds. Extremities: Extremities Skin: Warm and dry. Neuro: Alert and oriented times3; cranial nerves II through XII grossly intact. Psych: Mood and affect normal.   Data Review:    Labs: Basic Metabolic Panel:  Recent Labs Lab 01/29/15 1102  NA 138  K 3.8  CL 100*  CO2 20*  GLUCOSE 149*  BUN 9  CREATININE 1.06*  CALCIUM 9.1   Liver Function Tests:  Recent Labs Lab 01/29/15 1102  AST 304*  ALT 132*  ALKPHOS 173*  BILITOT 2.6*  PROT 8.1  ALBUMIN 3.7    Recent Labs Lab 01/29/15 1102  LIPASE 1069*   No results for input(s): AMMONIA in the last 168 hours. CBC:  Recent Labs Lab 01/29/15 1102  WBC 21.4*  NEUTROABS 19.6*  HGB 16.5*  HCT 49.0*  MCV 98.2  PLT 295   Cardiac Enzymes: No results for input(s): CKTOTAL, CKMB, CKMBINDEX, TROPONINI in the last 168 hours.  BNP (last 3 results) No results for input(s): PROBNP in the last 8760 hours. CBG: No results for input(s): GLUCAP in the last 168 hours.  Radiographic Studies: Ct Renal Stone Study  01/29/2015  CLINICAL DATA:  Left flank pain and vomiting. EXAM: CT ABDOMEN AND PELVIS WITHOUT CONTRAST TECHNIQUE: Multidetector CT imaging of the abdomen and pelvis was performed following the standard protocol without IV contrast. COMPARISON:  CT scan dated 07/13/2014 FINDINGS: Lower chest:  Normal. Hepatobiliary: New hepatomegaly with increased severe hepatic steatosis. The gallbladder appears increased in density primarily in contrast to the liver parenchyma. Pancreas: The pancreas is diffusely swollen with extensive peripancreatic fluid  extending around the left adrenal gland and around the spleen and posterior to the stomach. No biliary or pancreatic ductal dilatation. The fluid extends down the left pericolic gutter into the pelvis. Small amount of fluid around the right lobe of the liver and in the right pericolic gutter. Spleen: Normal. Adrenals/Urinary Tract: Adrenal glands are normal. Kidneys are normal. Ureters are not dilated. Bladder is normal. Stomach/Bowel: Normal including the terminal ileum and appendix. Small calcification in the terminal ileum. Vascular/Lymphatic: Normal. Reproductive: Normal. Other: No free air. Musculoskeletal: Normal. IMPRESSION: 1. Acute diffuse pancreatitis with extensive peripancreatic fluid extending into the left upper quadrant and down the left pericolic gutter into the pelvis. 2. New hepatomegaly with marked progression of hepatic steatosis. Electronically Signed   By: Francene BoyersJames  Maxwell M.D.   On: 01/29/2015 13:55   *I have personally reviewed the images above*  EKG: Independently reviewed. none   Assessment/Plan:   Active Problems:   Alcohol abuse   Elevated LFTs   Acute pancreatitis   Sinus tachycardia (HCC)   Leukocytosis   AKI (acute kidney injury) (HCC)  Acute pancreatitis likely due to alcohol abuse. We'll place nothing by mouth start on aggressive IV fluid hydration. Strict nothing by mouth's except for meds.Monitor strict I's and O's continue to monitor her creatinine has her acute renal failure is likely prerenal . Daily weights start her on a PCA pump, check a UDS her all call levels less than 5 her pregnancy test was negative. She seems pretty stable laying comfortably in bed, can she go to MedSurg unit and monitor closely. Will have a low threshold to consider her to the step down if she decompensates. Also start her on IV Primaxin due to the peripancreatic fluid collection  to monitor fever curve, continue her on protonic and sucralfate. Resume her metoprolol.   Her LFTs  are elevated  in the pattern of alcohol abuse, will monitor with serial protocol.   Leukocytosis likely stressed emargination she has remained afebrile she was started empirically on IV Primaxin. See above for further details.  Sinus tachycardia likely due to stress.   DVT prophylaxis  Code Status: Full. Family Communication: *none Disposition Plan: Home when stable.  Time spent: 65 min  Marinda ElkFELIZ ORTIZ, ABRAHAM [facility] Pager [phone]  If 7PM-7AM, please  contact night-coverage [email or web address] Password [email or web address] 01/29/2015, 3:04 PM

## 2015-01-29 NOTE — ED Notes (Signed)
Pt returned from CT and is c/o nausea with dry heaves.

## 2015-01-29 NOTE — ED Provider Notes (Signed)
CSN: [id number]     Arrival date & time 01/29/15  4098 History   First MD Initiated Contact with Patient 01/29/15 1002     Chief Complaint  Patient presents with  . Emesis     (Consider location/radiation/quality/duration/timing/severity/associated sxs/prior Treatment) HPI Patient reports sudden onset of vomiting yesterday midday. She states in conjunction with that she has severe left flank pain. She reports multiple episodes of vomiting and now is becoming mostly frothy. She states her husband is trying to massage the left flank area but it didn't help and her pain is gotten worse today. She denies prior history of kidney stone. She denies fever or urinary symptoms. She denies diarrhea. She can't think of anything she ate that was likely to cause her symptoms. 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 She does not work in Geologist, engineering. No known sick contacts. Past Medical History  Diagnosis Date  . Asthma   . GERD (gastroesophageal reflux disease)   . Alcohol abuse   . Environmental allergies   . Headache   . Hypertension   . Syncope and collapse   . Vision abnormalities   . Prolonged QT syndrome    Past Surgical History  Procedure Laterality Date  . Tympanostomy tube placement    . Esophagogastroduodenoscopy N/A 07/17/2014    Procedure: ESOPHAGOGASTRODUODENOSCOPY (EGD);  Surgeon: Dorena Cookey, MD;  Location: [facility];  Service: Endoscopy;  Laterality: N/A;   Family History  Problem Relation Age of Onset  . Heart failure Mother   . Heart failure Father   . Heart failure Maternal Grandfather    Social History  Substance Use Topics  . Smoking status: Former Smoker -- 0.50 packs/day for 2 years    Types: Cigarettes    Quit date: 04/08/2012  . Smokeless tobacco: Never Used  . Alcohol Use: 1.8 oz/week    0 Standard drinks or equivalent, 3 Shots of liquor per week     Comment: occasionally   OB History    No data  available     Review of Systems 10 Systems reviewed and are negative for acute change except as noted in the HPI.    Allergies  Peanuts  Home Medications   Prior to Admission medications   Medication Sig Start Date End Date Taking? Authorizing Provider  albuterol (PROVENTIL HFA;VENTOLIN HFA) 108 (90 BASE) MCG/ACT inhaler Inhale 2 puffs into the lungs every 6 (six) hours as needed for wheezing or shortness of breath (wheezing and sob).    Yes Historical Provider, MD  fexofenadine (ALLEGRA) 180 MG tablet Take 180 mg by mouth daily as needed for allergies (allergies).    Yes Historical Provider, MD  gabapentin (NEURONTIN) 300 MG capsule One in am, one in evening and two po at night 09/02/14  Yes Asa Lente, MD  metoprolol succinate (TOPROL-XL) 25 MG 24 hr tablet Take 1 tablet (25 mg total) by mouth daily. 06/17/14  Yes Wendall Stade, MD  mometasone-formoterol [facility]) 200-5 MCG/ACT AERO Inhale 2 puffs into the lungs 2 (two) times daily.   Yes Historical Provider, MD  montelukast (SINGULAIR) 10 MG tablet Take 10 mg by mouth daily.    Yes Historical Provider, MD  Multiple Vitamin (MULTIVITAMIN WITH MINERALS) TABS tablet Take 1 tablet by mouth daily. 07/19/14  Yes Belkys A Regalado, MD  ALPRAZolam (XANAX) 0.25 MG tablet Take 1 tablet (0.25 mg total) by mouth 2 (two) times daily as needed for anxiety. Patient  not taking: Reported on 01/29/2015 06/11/14   Wendall Stade, MD  pantoprazole (PROTONIX) 40 MG tablet Take 1 tablet (40 mg total) by mouth 2 (two) times daily. Patient not taking: Reported on 10/29/2014 07/19/14   Belkys A Regalado, MD  polyethylene glycol (MIRALAX / GLYCOLAX) packet Take 17 g by mouth daily as needed. Patient not taking: Reported on 10/29/2014 07/19/14   Belkys A Regalado, MD  sucralfate (CARAFATE) 1 GM/10ML suspension Take 10 mLs (1 g total) by mouth 4 (four) times daily -  with meals and at bedtime. Patient not taking: Reported on 10/29/2014 07/19/14   Belkys A Regalado, MD   traMADol (ULTRAM) 50 MG tablet Take 1 tablet (50 mg total) by mouth 3 (three) times daily as needed. Patient not taking: Reported on 10/29/2014 08/27/14   Asa Lente, MD   BP 117/95 mmHg  Pulse 125  Temp(Src) 99.4 F (37.4 C) (Oral)  Resp 22  Ht  (1.651 m)  Wt 208 lb 14.4 oz (94.756 kg)  BMI 34.76 kg/m2  SpO2 95%  LMP 12/30/2014 Physical Exam  Constitutional: She is oriented to person, place, and time.  Patient is moderately obese. She is alert and nontoxic. She sitting upright in the stretcher holding an emesis bag. She is mildly anxious in appearance. Color is good. No respiratory distress.  HENT:  Head: Normocephalic and atraumatic.  Mouth/Throat: Oropharynx is clear and moist.  Eyes: EOM are normal. Pupils are equal, round, and reactive to light. No scleral icterus.  Neck: Neck supple.  Cardiovascular: Regular rhythm, normal heart sounds and intact distal pulses.   Tachycardia.  Pulmonary/Chest: Effort normal and breath sounds normal.  Abdominal: Soft. Bowel sounds are normal. She exhibits no distension. There is tenderness.  Very minimal left lateral discomfort. No epigastric discomfort to palpation No guarding or rebound. Lower abdomen is nontender. No CVA tenderness. Patient does indicate the left flank is the area of her pain however it isn't reproducible to percussion.  Musculoskeletal: Normal range of motion. She exhibits no edema or tenderness.  Neurological: She is alert and oriented to person, place, and time. She has normal strength. Coordination normal. GCS eye subscore is 4. GCS verbal subscore is 5. GCS motor subscore is 6.  Skin: Skin is warm, dry and intact. No rash noted.  Psychiatric: She has a normal mood and affect.    ED Course  Procedures (including critical care time) Labs Review Labs Reviewed  COMPREHENSIVE METABOLIC PANEL - Abnormal; Notable for the following:    Chloride 100 (*)    CO2 20 (*)    Glucose, Bld 149 (*)    Creatinine, Ser  1.06 (*)    AST 304 (*)    ALT 132 (*)    Alkaline Phosphatase 173 (*)    Total Bilirubin 2.6 (*)    Anion gap 18 (*)    All other components within normal limits  LIPASE, BLOOD - Abnormal; Notable for the following:    Lipase 1069 (*)    All other components within normal limits  CBC WITH DIFFERENTIAL/PLATELET - Abnormal; Notable for the following:    WBC 21.4 (*)    Hemoglobin 16.5 (*)    HCT 49.0 (*)    Neutro Abs 19.6 (*)    All other components within normal limits  URINALYSIS, ROUTINE W REFLEX MICROSCOPIC (NOT AT [facility]) - Abnormal; Notable for the following:    Color, Urine ORANGE (*)    APPearance CLOUDY (*)    Hgb urine dipstick  SMALL (*)    Bilirubin Urine SMALL (*)    Protein, ur 100 (*)    Nitrite POSITIVE (*)    Leukocytes, UA SMALL (*)    All other components within normal limits  URINE RAPID DRUG SCREEN, HOSP PERFORMED - Abnormal; Notable for the following:    Opiates POSITIVE (*)    Benzodiazepines POSITIVE (*)    All other components within normal limits  BASIC METABOLIC PANEL - Abnormal; Notable for the following:    Calcium 7.6 (*)    All other components within normal limits  CBC - Abnormal; Notable for the following:    WBC 16.1 (*)    MCV 101.6 (*)    All other components within normal limits  URINE MICROSCOPIC-ADD ON - Abnormal; Notable for the following:    Squamous Epithelial / LPF FEW (*)    Bacteria, UA MANY (*)    All other components within normal limits  LIPASE, BLOOD - Abnormal; Notable for the following:    Lipase 603 (*)    All other components within normal limits  HEPATIC FUNCTION PANEL - Abnormal; Notable for the following:    Total Protein 6.4 (*)    Albumin 3.0 (*)    AST 159 (*)    ALT 85 (*)    Alkaline Phosphatase 135 (*)    Total Bilirubin 2.9 (*)    Bilirubin, Direct 1.4 (*)    Indirect Bilirubin 1.5 (*)    All other components within normal limits  ETHANOL  COMPREHENSIVE METABOLIC PANEL  CBC  LIPASE, BLOOD  I-STAT BETA  HCG BLOOD, ED ([facility], [facility], [facility] ONLY)    Imaging Review Ct Renal Stone Study  01/29/2015  CLINICAL DATA:  Left flank pain and vomiting. EXAM: CT ABDOMEN AND PELVIS WITHOUT CONTRAST TECHNIQUE: Multidetector CT imaging of the abdomen and pelvis was performed following the standard protocol without IV contrast. COMPARISON:  CT scan dated 07/13/2014 FINDINGS: Lower chest:  Normal. Hepatobiliary: New hepatomegaly with increased severe hepatic steatosis. The gallbladder appears increased in density primarily in contrast to the liver parenchyma. Pancreas: The pancreas is diffusely swollen with extensive peripancreatic fluid extending around the left adrenal gland and around the spleen and posterior to the stomach. No biliary or pancreatic ductal dilatation. The fluid extends down the left pericolic gutter into the pelvis. Small amount of fluid around the right lobe of the liver and in the right pericolic gutter. Spleen: Normal. Adrenals/Urinary Tract: Adrenal glands are normal. Kidneys are normal. Ureters are not dilated. Bladder is normal. Stomach/Bowel: Normal including the terminal ileum and appendix. Small calcification in the terminal ileum. Vascular/Lymphatic: Normal. Reproductive: Normal. Other: No free air. Musculoskeletal: Normal. IMPRESSION: 1. Acute diffuse pancreatitis with extensive peripancreatic fluid extending into the left upper quadrant and down the left pericolic gutter into the pelvis. 2. New hepatomegaly with marked progression of hepatic steatosis. Electronically Signed   By: Francene Boyers M.D.   On: 01/29/2015 13:55   I have personally reviewed and evaluated these images and lab results as part of my medical decision-making.   EKG Interpretation None      MDM   Final diagnoses:  Acute pancreatitis, unspecified pancreatitis type   Patient presents with atypical pain complaint for pancreatitis. She noted flank pain with no intra-abdominal discomfort. She had had recurrent vomiting. CT  scan shows significant pancreatitis with stranding. She also has leukocytosis and lipase greater 1000. Patient will be admitted for ongoing treatment of pancreatitis. Clinically, the patient is alert and appropriate with  stable blood pressures and good response to pain medications and fluids.    Arby BarretteMarcy Jung Yurchak, MD 01/31/15 0001

## 2015-01-29 NOTE — ED Notes (Signed)
Pt is aware of urine sample. State she still does not have to go right now

## 2015-01-29 NOTE — ED Notes (Signed)
Per EMS- patient c/o continuous vomiting since 1500 yesterday. Patient denies any other symptoms.

## 2015-01-29 NOTE — Progress Notes (Signed)
Utilization Review completed.  Marrie Chandra RN CM  

## 2015-01-30 LAB — URINALYSIS, ROUTINE W REFLEX MICROSCOPIC
GLUCOSE, UA: NEGATIVE mg/dL
Ketones, ur: NEGATIVE mg/dL
Nitrite: POSITIVE — AB
PH: 5.5 (ref 5.0–8.0)
Protein, ur: 100 mg/dL — AB
Specific Gravity, Urine: 1.024 (ref 1.005–1.030)
Urobilinogen, UA: 1 mg/dL (ref 0.0–1.0)

## 2015-01-30 LAB — HEPATIC FUNCTION PANEL
ALBUMIN: 3 g/dL — AB (ref 3.5–5.0)
ALK PHOS: 135 U/L — AB (ref 38–126)
ALT: 85 U/L — ABNORMAL HIGH (ref 14–54)
AST: 159 U/L — AB (ref 15–41)
BILIRUBIN DIRECT: 1.4 mg/dL — AB (ref 0.1–0.5)
BILIRUBIN TOTAL: 2.9 mg/dL — AB (ref 0.3–1.2)
Indirect Bilirubin: 1.5 mg/dL — ABNORMAL HIGH (ref 0.3–0.9)
Total Protein: 6.4 g/dL — ABNORMAL LOW (ref 6.5–8.1)

## 2015-01-30 LAB — LIPASE, BLOOD: Lipase: 603 U/L — ABNORMAL HIGH (ref 22–51)

## 2015-01-30 LAB — BASIC METABOLIC PANEL
ANION GAP: 9 (ref 5–15)
BUN: 12 mg/dL (ref 6–20)
CALCIUM: 7.6 mg/dL — AB (ref 8.9–10.3)
CHLORIDE: 109 mmol/L (ref 101–111)
CO2: 24 mmol/L (ref 22–32)
Creatinine, Ser: 0.74 mg/dL (ref 0.44–1.00)
GFR calc non Af Amer: >60 mL/min (ref >60)
Glucose, Bld: 96 mg/dL (ref 65–99)
Potassium: 4.1 mmol/L (ref 3.5–5.1)
Sodium: 142 mmol/L (ref 135–145)

## 2015-01-30 LAB — URINE MICROSCOPIC-ADD ON

## 2015-01-30 LAB — CBC
HCT: 45.3 % (ref 36.0–46.0)
HEMOGLOBIN: 14.4 g/dL (ref 12.0–15.0)
MCH: 32.3 pg (ref 26.0–34.0)
MCHC: 31.8 g/dL (ref 30.0–36.0)
MCV: 101.6 fL — AB (ref 78.0–100.0)
Platelets: 208 10*3/uL (ref 150–400)
RBC: 4.46 MIL/uL (ref 3.87–5.11)
RDW: 14.2 % (ref 11.5–15.5)
WBC: 16.1 10*3/uL — ABNORMAL HIGH (ref 4.0–10.5)

## 2015-01-30 LAB — RAPID URINE DRUG SCREEN, HOSP PERFORMED
Amphetamines: NOT DETECTED
BARBITURATES: NOT DETECTED
Benzodiazepines: POSITIVE — AB
Cocaine: NOT DETECTED
Opiates: POSITIVE — AB
Tetrahydrocannabinol: NOT DETECTED

## 2015-01-30 NOTE — Progress Notes (Signed)
[facility] PROGRESS NOTE  Kara [name] OZH:[id number] DOB: 1985/06/22 DOA: 01/29/2015 PCP:  Duane Lope, MD  HPI/Brief narrative 29yo female past medical history of hypertension social drinker which usually drinks over the weekend hard liquor regularly that comes in she started having left flank pain 24 hours prior to admission. Patient was found to have acute pancreatitis and was subsequently admitted for further work up.  Assessment/Plan: 1. Acute alcoholic pancreatitis 1. Presenting lipase of 1069, improved to 603 2. Cont NPO status with aggressive IVF hydration 3. Pt is continued on empiric abx given peripancreatic fluid seen 2. Acute alcoholic hepatitis 1. LFT's elevated in ratio 2:1 2. LFT's trending down 3. Cont supportive care 3. Asthma 1. Stable. No wheezing 4. HTN 1. BP stable 2. Cont monitor 5. ETOH abuse 1. Cessation done face to face 2. On CIWA 6. DVT prophylaxis 1. Heparin subQ  Code Status: Full Family Communication: Pt in room, discussed with pt's husband over phone Disposition Plan: Pending   Consultants:    Procedures:    Antibiotics: Anti-infectives    Start     Dose/Rate Route Frequency Ordered Stop   01/30/15 0000  imipenem-cilastatin (PRIMAXIN) 500 mg in sodium chloride 0.9 % 100 mL IVPB     500 mg 200 mL/hr over 30 Minutes Intravenous 4 times per day 01/29/15 1633     01/29/15 1645  imipenem-cilastatin (PRIMAXIN) 500 mg in sodium chloride 0.9 % 100 mL IVPB     500 mg 200 mL/hr over 30 Minutes Intravenous STAT 01/29/15 1633 01/29/15 1748      HPI/Subjective: Asking about pain meds  Objective: Filed Vitals:   01/30/15 0749 01/30/15 0805 01/30/15 1200 01/30/15 1402  BP:    126/87  Pulse:    120  Temp:    99.3 F (37.4 C)  TempSrc:    Oral  Resp: Height:      Weight:      SpO2: 96% 93% 92% 100%    Intake/Output Summary (Last 24 hours) at 01/30/15 1449 Last data filed at 01/30/15 6295  Gross per 24 hour   Intake   3290 ml  Output    350 ml  Net   2940 ml   Filed Weights   01/29/15 1617  Weight: 94.756 kg (208 lb 14.4 oz)    Exam:   General:  Awake, in nad  Cardiovascular: regular, s1, s2  Respiratory: normal resp effort, no wheezing  Abdomen: soft,nondistended  Musculoskeletal: perfused, no clubbing   Data Reviewed: Basic Metabolic Panel:  Recent Labs Lab 01/29/15 1102 01/30/15 0510  NA 138 142  K 3.8 4.1  CL 100* 109  CO2 20* 24  GLUCOSE 149* 96  BUN 9 12  CREATININE 1.06* 0.74  CALCIUM 9.1 7.6*   Liver Function Tests:  Recent Labs Lab 01/29/15 1102 01/30/15 0510  AST 304* 159*  ALT 132* 85*  ALKPHOS 173* 135*  BILITOT 2.6* 2.9*  PROT 8.1 6.4*  ALBUMIN 3.7 3.0*    Recent Labs Lab 01/29/15 1102 01/30/15 0510  LIPASE 1069* 603*   No results for input(s): AMMONIA in the last 168 hours. CBC:  Recent Labs Lab 01/29/15 1102 01/30/15 0510  WBC 21.4* 16.1*  NEUTROABS 19.6*  --   HGB 16.5* 14.4  HCT 49.0* 45.3  MCV 98.2 101.6*  PLT 295 208   Cardiac Enzymes: No results for input(s): CKTOTAL, CKMB, CKMBINDEX, TROPONINI in the last 168 hours. BNP (last 3 results)  Recent Labs  07/06/14 1255  BNP 79.2    ProBNP (last 3 results) No results for input(s): PROBNP in the last 8760 hours.  CBG: No results for input(s): GLUCAP in the last 168 hours.  No results found for this or any previous visit (from the past 240 hour(s)).   Studies: Ct Renal Stone Study  01/29/2015  CLINICAL DATA:  Left flank pain and vomiting. EXAM: CT ABDOMEN AND PELVIS WITHOUT CONTRAST TECHNIQUE: Multidetector CT imaging of the abdomen and pelvis was performed following the standard protocol without IV contrast. COMPARISON:  CT scan dated 07/13/2014 FINDINGS: Lower chest:  Normal. Hepatobiliary: New hepatomegaly with increased severe hepatic steatosis. The gallbladder appears increased in density primarily in contrast to the liver parenchyma. Pancreas: The pancreas is  diffusely swollen with extensive peripancreatic fluid extending around the left adrenal gland and around the spleen and posterior to the stomach. No biliary or pancreatic ductal dilatation. The fluid extends down the left pericolic gutter into the pelvis. Small amount of fluid around the right lobe of the liver and in the right pericolic gutter. Spleen: Normal. Adrenals/Urinary Tract: Adrenal glands are normal. Kidneys are normal. Ureters are not dilated. Bladder is normal. Stomach/Bowel: Normal including the terminal ileum and appendix. Small calcification in the terminal ileum. Vascular/Lymphatic: Normal. Reproductive: Normal. Other: No free air. Musculoskeletal: Normal. IMPRESSION: 1. Acute diffuse pancreatitis with extensive peripancreatic fluid extending into the left upper quadrant and down the left pericolic gutter into the pelvis. 2. New hepatomegaly with marked progression of hepatic steatosis. Electronically Signed   By: Francene BoyersJames  Maxwell M.D.   On: 01/29/2015 13:55    Scheduled Meds: . chlorhexidine  15 mL Mouth Rinse BID  . folic acid  1 mg Oral Daily  . gabapentin  300 mg Oral QHS  . heparin  5,000 Units Subcutaneous 3 times per day  . HYDROmorphone PCA 0.3 mg/mL   Intravenous 6 times per day  . imipenem-cilastatin  500 mg Intravenous 4 times per day  . LORazepam  0-4 mg Intravenous Q6H   Followed by  . LORazepam  0-4 mg Intravenous Q12H  . metoprolol succinate  25 mg Oral Daily  . mometasone-formoterol  2 puff Inhalation BID  . montelukast  10 mg Oral Daily  . multivitamin with minerals  1 tablet Oral Daily  . thiamine  100 mg Oral Daily   Or  . thiamine  100 mg Intravenous Daily   Continuous Infusions:    Principal Problem:   Acute pancreatitis Active Problems:   Alcohol abuse   Elevated LFTs   Sinus tachycardia (HCC)   Leukocytosis   AKI (acute kidney injury) (HCC)   Kara [name] K  [facility] Pager [phone]. If 7PM-7AM, please contact night-coverage at  [email or web address], password [email or web address] 01/30/2015, 2:49 PM  LOS: 1 day

## 2015-01-31 ENCOUNTER — Inpatient Hospital Stay (HOSPITAL_COMMUNITY): Payer: BC Managed Care – PPO

## 2015-01-31 LAB — COMPREHENSIVE METABOLIC PANEL
ALK PHOS: 116 U/L (ref 38–126)
ALT: 61 U/L — AB (ref 14–54)
AST: 110 U/L — ABNORMAL HIGH (ref 15–41)
Albumin: 2.6 g/dL — ABNORMAL LOW (ref 3.5–5.0)
Anion gap: 9 (ref 5–15)
BUN: 11 mg/dL (ref 6–20)
CALCIUM: 7.5 mg/dL — AB (ref 8.9–10.3)
CO2: 23 mmol/L (ref 22–32)
CREATININE: 0.62 mg/dL (ref 0.44–1.00)
Chloride: 105 mmol/L (ref 101–111)
GFR calc non Af Amer: >60 mL/min (ref >60)
Glucose, Bld: 98 mg/dL (ref 65–99)
Potassium: 3.3 mmol/L — ABNORMAL LOW (ref 3.5–5.1)
SODIUM: 137 mmol/L (ref 135–145)
Total Bilirubin: 3.9 mg/dL — ABNORMAL HIGH (ref 0.3–1.2)
Total Protein: 5.8 g/dL — ABNORMAL LOW (ref 6.5–8.1)

## 2015-01-31 LAB — CBC
HCT: 42 % (ref 36.0–46.0)
Hemoglobin: 13.5 g/dL (ref 12.0–15.0)
MCH: 32.6 pg (ref 26.0–34.0)
MCHC: 32.1 g/dL (ref 30.0–36.0)
MCV: 101.4 fL — ABNORMAL HIGH (ref 78.0–100.0)
PLATELETS: 159 10*3/uL (ref 150–400)
RBC: 4.14 MIL/uL (ref 3.87–5.11)
RDW: 14.1 % (ref 11.5–15.5)
WBC: 18.5 10*3/uL — ABNORMAL HIGH (ref 4.0–10.5)

## 2015-01-31 LAB — LIPASE, BLOOD: LIPASE: 97 U/L — AB (ref 22–51)

## 2015-01-31 MED ORDER — MORPHINE SULFATE (PF) 2 MG/ML IV SOLN
2.0000 mg | INTRAVENOUS | Status: DC | PRN
  Administered 2015-01-31 – 2015-02-01 (×4): 2 mg via INTRAVENOUS
  Filled 2015-01-31 (×4): qty 1

## 2015-01-31 MED ORDER — POTASSIUM CHLORIDE 10 MEQ/100ML IV SOLN
10.0000 meq | INTRAVENOUS | Status: AC
  Administered 2015-01-31 (×3): 10 meq via INTRAVENOUS
  Filled 2015-01-31 (×4): qty 100

## 2015-01-31 MED ORDER — MORPHINE SULFATE (PF) 4 MG/ML IV SOLN
4.0000 mg | Freq: Once | INTRAVENOUS | Status: AC
  Administered 2015-01-31: 4 mg via INTRAVENOUS
  Filled 2015-01-31: qty 1

## 2015-01-31 MED ORDER — ACETAMINOPHEN 325 MG PO TABS
650.0000 mg | ORAL_TABLET | Freq: Once | ORAL | Status: AC
  Administered 2015-02-01: 650 mg via ORAL
  Filled 2015-01-31: qty 2

## 2015-01-31 NOTE — Progress Notes (Signed)
ANTIBIOTIC CONSULT NOTE - Follow-Up  Pharmacy Consult for Primaxin Indication: Intra-abdominal infection  Allergies  Allergen Reactions  . Peanuts [Peanut Oil] Itching    Sensitivity     Patient Measurements: Height: 5\' 5"  (165.1 cm) Weight: 208 lb 14.4 oz (94.756 kg) IBW/kg (Calculated) : 57  Vital Signs: Temp: 100.4 F (38 C) (10/14 0517) Temp Source: Oral (10/14 0517) BP: 113/75 mmHg (10/14 0517) Pulse Rate: 119 (10/14 0517) Intake/Output from previous day: 10/13 0701 - 10/14 0700 In: 300 [IV Piggyback:300] Out: -  Intake/Output from this shift:    Labs:  Recent Labs  01/29/15 1102 01/30/15 0510 01/31/15 0510  WBC 21.4* 16.1* 18.5*  HGB 16.5* 14.4 13.5  PLT 295 208 159  CREATININE 1.06* 0.74 0.62   Estimated Creatinine Clearance: 117 mL/min (by C-G formula based on Cr of 0.62). No results for input(s): VANCOTROUGH, VANCOPEAK, VANCORANDOM, GENTTROUGH, GENTPEAK, GENTRANDOM, TOBRATROUGH, TOBRAPEAK, TOBRARND, AMIKACINPEAK, AMIKACINTROU, AMIKACIN in the last 72 hours.   Microbiology: No results found for this or any previous visit (from the past 720 hour(s)).  Medical History: Past Medical History  Diagnosis Date  . Asthma   . GERD (gastroesophageal reflux disease)   . Alcohol abuse   . Environmental allergies   . Headache   . Hypertension   . Syncope and collapse   . Vision abnormalities   . Prolonged QT syndrome    Assessment: 29 yoF presents with left flank pain and vomiting, found to have acute pancreatitis secondary to alcohol abuse with extensive peripancreatic fluid extending into LUQ and down left pericolic. Pharmacy consulted to start primaxin for intra-abdominal infection.    Anti-infectives 10/12 >> Primaxin  >>  Vitals/Labs WBC: Elevated at 18.5K, trending up  Tm24h: 100.32F SCr: 0.62, CrCl > 100 CG/N  Cultures None ordered  Goal of Therapy:  Appropriate antibiotic dosing for renal function and indication Eradication of  infection  Plan:  Continue Primaxin 500mg  IV q6h F/u renal function, cultures as available, clinical course   Greer PickerelJigna Deloyce Walthers, PharmD, BCPS Pager: [phone] 01/31/2015 7:32 AM

## 2015-01-31 NOTE — Progress Notes (Signed)
[facility] PROGRESS NOTE  Kara [name] QMV:[id number] DOB: October 31, 1985 DOA: 01/29/2015 PCP:  Duane Lope, MD  HPI/Brief narrative 29yo female past medical history of hypertension social drinker which usually drinks over the weekend hard liquor regularly that comes in she started having left flank pain 24 hours prior to admission. Patient was found to have acute pancreatitis and was subsequently admitted for further work up.  Assessment/Plan: 1. Acute alcoholic pancreatitis with sepsis 1. Presenting lipase of 1069 has improved to 90's 2. For now, will cont NPO status with aggressive IVF hydration given fever this AM and leukocytosis (see below) 3. Will cont on empiric primaxin given peripancreatic fluid seen and AM fever 2. Acute alcoholic hepatitis 1. Presenting LFT's elevated in ratio 2:1 2. LFT's are trending down 3. Cont supportive care 4. Advised patient to remain free from ETOH and she agrees 3. Fever 1. Temp of 100.52F this AM 2. CXR without finding of PNA 3. Have ordered blood cx - pending 4. Admit UA unremarkable 5. Continue empiric Primaxin per above 4. Asthma 1. Stable. No wheezing 5. HTN 1. BP stable 2. Cont monitor 6. ETOH abuse 1. Cessation was done face to face 2. On CIWA 3. No evidence of withdrawals at this time. Last drink was on 10/10 7. DVT prophylaxis 1. Heparin subQ  Code Status: Full Family Communication: Pt in room Disposition Plan: Pending   Consultants:    Procedures:    Antibiotics: Anti-infectives    Start     Dose/Rate Route Frequency Ordered Stop   01/30/15 0000  imipenem-cilastatin (PRIMAXIN) 500 mg in sodium chloride 0.9 % 100 mL IVPB     500 mg 200 mL/hr over 30 Minutes Intravenous 4 times per day 01/29/15 1633     01/29/15 1645  imipenem-cilastatin (PRIMAXIN) 500 mg in sodium chloride 0.9 % 100 mL IVPB     500 mg 200 mL/hr over 30 Minutes Intravenous STAT 01/29/15 1633 01/29/15 1748      HPI/Subjective: Denies abd  pain. No complaints at this time  Objective: Filed Vitals:   01/30/15 2300 01/31/15 0109 01/31/15 0330 01/31/15 0517  BP: 117/95   113/75  Pulse: 125   119  Temp: 99.4 F (37.4 C)   100.4 F (38 C)  TempSrc: Oral   Oral  Resp: Height:      Weight:      SpO2: 95% 95% 95% 92%    Intake/Output Summary (Last 24 hours) at 01/31/15 1315 Last data filed at 01/31/15 0857  Gross per 24 hour  Intake    200 ml  Output    250 ml  Net    -50 ml   Filed Weights   01/29/15 1617  Weight: 94.756 kg (208 lb 14.4 oz)    Exam:   General:  Awake, sitting in bed, in nad  Cardiovascular: regular, s1, s2  Respiratory: normal resp effort, no wheezing  Abdomen: soft,nondistended, decreased BS  Musculoskeletal: perfused, no clubbing   Data Reviewed: Basic Metabolic Panel:  Recent Labs Lab 01/29/15 1102 01/30/15 0510 01/31/15 0510  NA 138 142 137  K 3.8 4.1 3.3*  CL 100* 109 105  CO2 20* 24 23  GLUCOSE 149* 96 98  BUN CREATININE 1.06* 0.74 0.62  CALCIUM 9.1 7.6* 7.5*   Liver Function Tests:  Recent Labs Lab 01/29/15 1102 01/30/15 0510 01/31/15 0510  AST 304* 159* 110*  ALT 132* 85* 61*  ALKPHOS 173* 135* 116  BILITOT 2.6*  2.9* 3.9*  PROT 8.1 6.4* 5.8*  ALBUMIN 3.7 3.0* 2.6*    Recent Labs Lab 01/29/15 1102 01/30/15 0510 01/31/15 0510  LIPASE 1069* 603* 97*   No results for input(s): AMMONIA in the last 168 hours. CBC:  Recent Labs Lab 01/29/15 1102 01/30/15 0510 01/31/15 0510  WBC 21.4* 16.1* 18.5*  NEUTROABS 19.6*  --   --   HGB 16.5* 14.4 13.5  HCT 49.0* 45.3 42.0  MCV 98.2 101.6* 101.4*  PLT 295 208 159   Cardiac Enzymes: No results for input(s): CKTOTAL, CKMB, CKMBINDEX, TROPONINI in the last 168 hours. BNP (last 3 results)  Recent Labs  07/06/14 1255  BNP 79.2    ProBNP (last 3 results) No results for input(s): PROBNP in the last 8760 hours.  CBG: No results for input(s): GLUCAP in the last 168 hours.  No  results found for this or any previous visit (from the past 240 hour(s)).   Studies: Dg Chest Port 1 View  01/31/2015  CLINICAL DATA:  Nonproductive cough. EXAM: PORTABLE CHEST 1 VIEW COMPARISON:  July 15, 2014. FINDINGS: The heart size and mediastinal contours are within normal limits. No pneumothorax is noted. Right lung is clear. Mild left basilar subsegmental atelectasis is noted. No significant pleural effusion is noted. The visualized skeletal structures are unremarkable. IMPRESSION: Mild left basilar subsegmental atelectasis. Electronically Signed   By: Lupita RaiderJames  Green Jr, M.D.   On: 01/31/2015 12:36   Ct Renal Stone Study  01/29/2015  CLINICAL DATA:  Left flank pain and vomiting. EXAM: CT ABDOMEN AND PELVIS WITHOUT CONTRAST TECHNIQUE: Multidetector CT imaging of the abdomen and pelvis was performed following the standard protocol without IV contrast. COMPARISON:  CT scan dated 07/13/2014 FINDINGS: Lower chest:  Normal. Hepatobiliary: New hepatomegaly with increased severe hepatic steatosis. The gallbladder appears increased in density primarily in contrast to the liver parenchyma. Pancreas: The pancreas is diffusely swollen with extensive peripancreatic fluid extending around the left adrenal gland and around the spleen and posterior to the stomach. No biliary or pancreatic ductal dilatation. The fluid extends down the left pericolic gutter into the pelvis. Small amount of fluid around the right lobe of the liver and in the right pericolic gutter. Spleen: Normal. Adrenals/Urinary Tract: Adrenal glands are normal. Kidneys are normal. Ureters are not dilated. Bladder is normal. Stomach/Bowel: Normal including the terminal ileum and appendix. Small calcification in the terminal ileum. Vascular/Lymphatic: Normal. Reproductive: Normal. Other: No free air. Musculoskeletal: Normal. IMPRESSION: 1. Acute diffuse pancreatitis with extensive peripancreatic fluid extending into the left upper quadrant and down  the left pericolic gutter into the pelvis. 2. New hepatomegaly with marked progression of hepatic steatosis. Electronically Signed   By: Francene BoyersJames  Maxwell M.D.   On: 01/29/2015 13:55    Scheduled Meds: . chlorhexidine  15 mL Mouth Rinse BID  . folic acid  1 mg Oral Daily  . gabapentin  300 mg Oral QHS  . heparin  5,000 Units Subcutaneous 3 times per day  . imipenem-cilastatin  500 mg Intravenous 4 times per day  . LORazepam  0-4 mg Intravenous Q12H  . metoprolol succinate  25 mg Oral Daily  . mometasone-formoterol  2 puff Inhalation BID  . montelukast  10 mg Oral Daily  . multivitamin with minerals  1 tablet Oral Daily  . potassium chloride  10 mEq Intravenous Q1 Hr x 4  . thiamine  100 mg Oral Daily   Or  . thiamine  100 mg Intravenous Daily   Continuous Infusions:  Principal Problem:   Acute pancreatitis Active Problems:   Alcohol abuse   Elevated LFTs   Sinus tachycardia (HCC)   Leukocytosis   AKI (acute kidney injury) (HCC)   Allisen Pidgeon K  [facility] Pager [phone]. If 7PM-7AM, please contact night-coverage at [email or web address], password [facility] 01/31/2015, 1:15 PM  LOS: 2 days

## 2015-01-31 NOTE — Progress Notes (Signed)
Date: January 31, 2015 Chart reviewed for concurrent status and case management needs. Will continue to follow patient for changes and needs: Etoh w/d, labs remains elevated, patient still somewhat confused, Marcelle Smilinghonda Davis, RN, BSN, [place]   [phone]

## 2015-02-01 ENCOUNTER — Encounter (HOSPITAL_COMMUNITY): Payer: Self-pay | Admitting: Radiology

## 2015-02-01 ENCOUNTER — Inpatient Hospital Stay (HOSPITAL_COMMUNITY): Payer: BC Managed Care – PPO

## 2015-02-01 DIAGNOSIS — K8501 Idiopathic acute pancreatitis with uninfected necrosis: Secondary | ICD-10-CM

## 2015-02-01 LAB — CBC
HEMATOCRIT: 38 % (ref 36.0–46.0)
Hemoglobin: 12.4 g/dL (ref 12.0–15.0)
MCH: 32.3 pg (ref 26.0–34.0)
MCHC: 32.6 g/dL (ref 30.0–36.0)
MCV: 99 fL (ref 78.0–100.0)
PLATELETS: 136 10*3/uL — AB (ref 150–400)
RBC: 3.84 MIL/uL — AB (ref 3.87–5.11)
RDW: 13.7 % (ref 11.5–15.5)
WBC: 13.6 10*3/uL — ABNORMAL HIGH (ref 4.0–10.5)

## 2015-02-01 LAB — COMPREHENSIVE METABOLIC PANEL
ALT: 47 U/L (ref 14–54)
ANION GAP: 9 (ref 5–15)
AST: 101 U/L — AB (ref 15–41)
Albumin: 2.4 g/dL — ABNORMAL LOW (ref 3.5–5.0)
Alkaline Phosphatase: 106 U/L (ref 38–126)
BILIRUBIN TOTAL: 4.1 mg/dL — AB (ref 0.3–1.2)
BUN: 8 mg/dL (ref 6–20)
CHLORIDE: 100 mmol/L — AB (ref 101–111)
CO2: 26 mmol/L (ref 22–32)
Calcium: 7.7 mg/dL — ABNORMAL LOW (ref 8.9–10.3)
Creatinine, Ser: 0.51 mg/dL (ref 0.44–1.00)
Glucose, Bld: 117 mg/dL — ABNORMAL HIGH (ref 65–99)
POTASSIUM: 3.4 mmol/L — AB (ref 3.5–5.1)
Sodium: 135 mmol/L (ref 135–145)
TOTAL PROTEIN: 5.5 g/dL — AB (ref 6.5–8.1)

## 2015-02-01 MED ORDER — HYDROMORPHONE HCL 1 MG/ML IJ SOLN
1.0000 mg | INTRAMUSCULAR | Status: DC | PRN
  Administered 2015-02-01 – 2015-02-09 (×41): 1 mg via INTRAVENOUS
  Filled 2015-02-01 (×43): qty 1

## 2015-02-01 MED ORDER — IOHEXOL 300 MG/ML  SOLN
50.0000 mL | Freq: Once | INTRAMUSCULAR | Status: AC | PRN
  Administered 2015-02-01: 50 mL via ORAL

## 2015-02-01 MED ORDER — IOHEXOL 300 MG/ML  SOLN
100.0000 mL | Freq: Once | INTRAMUSCULAR | Status: AC | PRN
  Administered 2015-02-01: 100 mL via INTRAVENOUS

## 2015-02-01 MED ORDER — POTASSIUM CHLORIDE CRYS ER 20 MEQ PO TBCR: 40.0000 meq | EXTENDED_RELEASE_TABLET | Freq: Once | ORAL | Status: DC

## 2015-02-01 MED ORDER — POTASSIUM CHLORIDE 10 MEQ/100ML IV SOLN
10.0000 meq | INTRAVENOUS | Status: AC
  Administered 2015-02-01 (×4): 10 meq via INTRAVENOUS
  Filled 2015-02-01 (×4): qty 100

## 2015-02-01 MED ORDER — HYDROMORPHONE HCL 1 MG/ML IJ SOLN
1.0000 mg | INTRAMUSCULAR | Status: DC | PRN
  Administered 2015-02-01 (×3): 1 mg via INTRAVENOUS
  Filled 2015-02-01 (×3): qty 1

## 2015-02-01 NOTE — Progress Notes (Signed)
ANTIBIOTIC CONSULT NOTE - Follow up  Pharmacy Consult for Primaxin Indication: Intra-abdominal infection  Allergies  Allergen Reactions  . Peanuts [Peanut Oil] Itching    Sensitivity     Patient Measurements: Height: 5\' 5"  (165.1 cm) Weight: 208 lb 14.4 oz (94.756 kg) IBW/kg (Calculated) : 57  Vital Signs: Temp: 98.7 F (37.1 C) (10/15 0923) Temp Source: Oral (10/15 16100923) Intake/Output from previous day: 10/14 0701 - 10/15 0700 In: 0  Out: 250 [Urine:250]  Labs:  Recent Labs  01/30/15 0510 01/31/15 0510 02/01/15 0611  WBC 16.1* 18.5* 13.6*  HGB 14.4 13.5 12.4  PLT 208 159 136*  CREATININE 0.74 0.62 0.51   Estimated Creatinine Clearance: 117 mL/min (by C-G formula based on Cr of 0.51).  Assessment: 29 yoF presents with left flank pain and vomiting, found to have acute pancreatitis secondary to alcohol abuse with extensive peripancreatic fluid extending into LUQ and down left pericolic. Pharmacy consulted to dose Primaxin for intra-abdominal infection.    10/12 >> Primaxin  >>  Today, 02/01/2015: Day #4 Primaxin  Tmax 102  WBC elevated but improved, 13.6  SCr 0.5 with CrCl > 100 ml/min  No micro data.  Goal of Therapy:  Appropriate abx dosing, eradication of infection.   Plan:   Continue Primaxin 500mg  IV q6h  F/u renal function, cultures, clinical course   Lynann Beaverhristine Kyleen Villatoro PharmD, BCPS Pager [phone] 02/01/2015 1:05 PM

## 2015-02-01 NOTE — Progress Notes (Signed)
[facility] PROGRESS NOTE  Eudell Julian ZOX:[id number] DOB: May 01, 1985 DOA: 01/29/2015 PCP:  Duane Lope, MD  HPI/Brief narrative 29yo female past medical history of hypertension social drinker which usually drinks over the weekend hard liquor regularly that comes in she started having left flank pain 24 hours prior to admission. Patient was found to have acute pancreatitis and was subsequently admitted for further work up.  Assessment/Plan: 1. Acute alcoholic pancreatitis with sepsis present on admission 1. Pt with tachycardia and leukocytosis on admit 2. Presenting lipase of 1069 has improved to 90's 3. For now, will cont NPO status with aggressive IVF hydration 4. Fevers of 102 noted overnight. Continue primaxin as tolerated. Blood cx neg x 2 5. Patient is also complaining of increased flank/back pain. F/u CT abd with evidence of increasing peripancreatic fluid and loculated ascites/pseudocyst 6. Discussed case with GI who recommends continuing abx and supportive care for now. 2. Acute alcoholic hepatitis 1. Presenting LFT's elevated in ratio 2:1 2. LFT's steadily trending down 3. Cont supportive care 4. Advised patient to remain free from ETOH and she agrees 3. Fever 1. Tmax of 102F 2. CXR without finding of PNA 3. Blood cx - neg x 2 4. Continue empiric Primaxin per above 4. Asthma 1. Stable. No wheezing 5. HTN 1. BP stable 2. Cont monitor 6. ETOH abuse 1. Cessation was done face to face 2. On CIWA 3. No evidence of withdrawals at this time. Last drink was on 10/10 7. DVT prophylaxis 1. Heparin subQ while admitted  Code Status: Full Family Communication: Pt in room Disposition Plan: Pending   Consultants:  Gastroenterology  Procedures:    Antibiotics: Anti-infectives    Start     Dose/Rate Route Frequency Ordered Stop   01/30/15 0000  imipenem-cilastatin (PRIMAXIN) 500 mg in sodium chloride 0.9 % 100 mL IVPB     500 mg 200 mL/hr over 30 Minutes  Intravenous 4 times per day 01/29/15 1633     01/29/15 1645  imipenem-cilastatin (PRIMAXIN) 500 mg in sodium chloride 0.9 % 100 mL IVPB     500 mg 200 mL/hr over 30 Minutes Intravenous STAT 01/29/15 1633 01/29/15 1748      HPI/Subjective: Complains of increased B flank pain and fever overnight  Objective: Filed Vitals:   01/31/15 1959 01/31/15 2138 02/01/15 0923 02/01/15 1500  BP:  116/72  108/68  Pulse:  127  114  Temp:  102 F (38.9 C) 98.7 F (37.1 C) 98.2 F (36.8 C)  TempSrc:  Oral Oral Oral  Resp:  18  18  Height:      Weight:      SpO2: 96% 99%  94%    Intake/Output Summary (Last 24 hours) at 02/01/15 1617 Last data filed at 02/01/15 1428  Gross per 24 hour  Intake      0 ml  Output      2 ml  Net     -2 ml   Filed Weights   01/29/15 1617  Weight: 94.756 kg (208 lb 14.4 oz)    Exam:   General:  Awake, laying in bed, in nad  Cardiovascular: regular, s1, s2  Respiratory: normal resp effort, no wheezing  Abdomen: soft,nondistended, decreased BS  Musculoskeletal: perfused, no clubbing, no cyanosis  Data Reviewed: Basic Metabolic Panel:  Recent Labs Lab 01/29/15 1102 01/30/15 0510 01/31/15 0510 02/01/15 0611  NA 138 142 137 135  K 3.8 4.1 3.3* 3.4*  CL 100* 109 105 100*  CO2 20* 24 23 26  GLUCOSE 149* 96 98 117*  BUN CREATININE 1.06* 0.74 0.62 0.51  CALCIUM 9.1 7.6* 7.5* 7.7*   Liver Function Tests:  Recent Labs Lab 01/29/15 1102 01/30/15 0510 01/31/15 0510 02/01/15 0611  AST 304* 159* 110* 101*  ALT 132* 85* 61* 47  ALKPHOS 173* 135* 116 106  BILITOT 2.6* 2.9* 3.9* 4.1*  PROT 8.1 6.4* 5.8* 5.5*  ALBUMIN 3.7 3.0* 2.6* 2.4*    Recent Labs Lab 01/29/15 1102 01/30/15 0510 01/31/15 0510  LIPASE 1069* 603* 97*   No results for input(s): AMMONIA in the last 168 hours. CBC:  Recent Labs Lab 01/29/15 1102 01/30/15 0510 01/31/15 0510 02/01/15 0611  WBC 21.4* 16.1* 18.5* 13.6*  NEUTROABS 19.6*  --   --   --    HGB 16.5* 14.4 13.5 12.4  HCT 49.0* 45.3 42.0 38.0  MCV 98.2 101.6* 101.4* 99.0  PLT 295 208 159 136*   Cardiac Enzymes: No results for input(s): CKTOTAL, CKMB, CKMBINDEX, TROPONINI in the last 168 hours. BNP (last 3 results)  Recent Labs  07/06/14 1255  BNP 79.2    ProBNP (last 3 results) No results for input(s): PROBNP in the last 8760 hours.  CBG: No results for input(s): GLUCAP in the last 168 hours.  Recent Results (from the past 240 hour(s))  Culture, blood (routine x 2)     Status: None (Preliminary result)   Collection Time: 01/31/15 12:17 PM  Result Value Ref Range Status   Specimen Description BLOOD RIGHT ANTECUBITAL  Final   Special Requests BOTTLES DRAWN AEROBIC AND ANAEROBIC 10CC  Final   Culture   Final    NO GROWTH 1 DAY Performed at [facility]    Report Status PENDING  Incomplete  Culture, blood (routine x 2)     Status: None (Preliminary result)   Collection Time: 01/31/15 12:17 PM  Result Value Ref Range Status   Specimen Description BLOOD RIGHT HAND  Final   Special Requests BOTTLES DRAWN AEROBIC AND ANAEROBIC 5CC  Final   Culture   Final    NO GROWTH 1 DAY Performed at [facility]    Report Status PENDING  Incomplete     Studies: Ct Abdomen Pelvis W Contrast  02/01/2015  CLINICAL DATA:  Follow-up pancreatitis EXAM: CT ABDOMEN AND PELVIS WITH CONTRAST TECHNIQUE: Multidetector CT imaging of the abdomen and pelvis was performed using the standard protocol following bolus administration of intravenous contrast. CONTRAST:  50mL OMNIPAQUE IOHEXOL 300 MG/ML SOLN, OMNIPAQUE IOHEXOL 300 MG/ML SOLN COMPARISON:  Ten/ 12/16 FINDINGS: There is small left pleural effusion. Trace right pleural effusion. Bilateral lower lobe posterior atelectasis. Again noted significant fatty infiltration of the liver. Heart size within normal limits. Again noted peripancreatic fluid and significant stranding of peripancreatic fat consistent with acute  pancreatitis. There is increasing peripancreatic fluid from prior exam. Small amount of perisplenic ascites. There is moderate ascites in left paracolic gutter. Small loculated fluid is noted adjacent to proximal gastric wall suspicious for loculated ascites or pancreatic pseudocyst. No definite pancreatic abscess is identified. The SMA and celiac trunk are patent. In axial image 35 there is filling defect in portal vein highly suspicious for nonocclusive thrombus. Similar appearance in axial image 36. The superior mesenteric vein is patent. No calcified gallstones are noted within gallbladder. No focal splenic mass. Kidneys are symmetrical in size. No hydronephrosis or hydroureter. Small amount of mesenteric ascites is noted adjacent to distal small bowel loops. There is moderate pelvic  ascites. The uterus and ovaries are unremarkable. No distal colonic obstruction. Urinary bladder is unremarkable. There is no small bowel obstruction. No pericecal inflammation. The terminal ileum is unremarkable. IMPRESSION: 1. There is small bilateral pleural effusion left greater than right with bilateral lower lobe posterior atelectasis. Significant fatty infiltration of the liver again noted. Again noted significant peripancreatic stranding and there is increase in peripancreatic fluid. Findings are consistent with acute pancreatitis. 2. Small perisplenic ascites. Moderate ascites noted in left paracolic gutter. Moderate pelvic ascites. Small ascites in lower mesentery adjacent to small bowel loops. 3. There is loculated fluid collection adjacent to proximal stomach just anterior to the spleen measures about 3.8 cm. This is suspicious for loculated ascites or pancreatic pseudocyst. 4. Question nonocclusive thrombosis in portal vein axial image 35 and 36. The superior mesenteric vein is patent. 5. No hydronephrosis or hydroureter. 6. No small bowel obstruction. Electronically Signed   By: Natasha MeadLiviu  Pop M.D.   On: 02/01/2015 12:48    Dg Chest Port 1 View  01/31/2015  CLINICAL DATA:  Nonproductive cough. EXAM: PORTABLE CHEST 1 VIEW COMPARISON:  July 15, 2014. FINDINGS: The heart size and mediastinal contours are within normal limits. No pneumothorax is noted. Right lung is clear. Mild left basilar subsegmental atelectasis is noted. No significant pleural effusion is noted. The visualized skeletal structures are unremarkable. IMPRESSION: Mild left basilar subsegmental atelectasis. Electronically Signed   By: Lupita RaiderJames  Green Jr, M.D.   On: 01/31/2015 12:36    Scheduled Meds: . chlorhexidine  15 mL Mouth Rinse BID  . folic acid  1 mg Oral Daily  . gabapentin  300 mg Oral QHS  . heparin  5,000 Units Subcutaneous 3 times per day  . imipenem-cilastatin  500 mg Intravenous 4 times per day  . LORazepam  0-4 mg Intravenous Q12H  . metoprolol succinate  25 mg Oral Daily  . mometasone-formoterol  2 puff Inhalation BID  . montelukast  10 mg Oral Daily  . multivitamin with minerals  1 tablet Oral Daily  . thiamine  100 mg Oral Daily   Or  . thiamine  100 mg Intravenous Daily   Continuous Infusions:    Principal Problem:   Acute pancreatitis Active Problems:   Alcohol abuse   Elevated LFTs   Sinus tachycardia (HCC)   Leukocytosis   AKI (acute kidney injury) (HCC)   CHIU, STEPHEN K  [facility] Pager [phone]. If 7PM-7AM, please contact night-coverage at [email or web address], password [email or web address] 02/01/2015, 4:17 PM  LOS: 3 days

## 2015-02-02 DIAGNOSIS — R000 Tachycardia, unspecified: Secondary | ICD-10-CM

## 2015-02-02 LAB — COMPREHENSIVE METABOLIC PANEL
ALK PHOS: 119 U/L (ref 38–126)
ALT: 46 U/L (ref 14–54)
ANION GAP: 8 (ref 5–15)
AST: 101 U/L — ABNORMAL HIGH (ref 15–41)
Albumin: 2.2 g/dL — ABNORMAL LOW (ref 3.5–5.0)
BILIRUBIN TOTAL: 3.4 mg/dL — AB (ref 0.3–1.2)
BUN: 6 mg/dL (ref 6–20)
CALCIUM: 8 mg/dL — AB (ref 8.9–10.3)
CO2: 24 mmol/L (ref 22–32)
Chloride: 101 mmol/L (ref 101–111)
Creatinine, Ser: 0.7 mg/dL (ref 0.44–1.00)
GLUCOSE: 97 mg/dL (ref 65–99)
Potassium: 3.5 mmol/L (ref 3.5–5.1)
Sodium: 133 mmol/L — ABNORMAL LOW (ref 135–145)
TOTAL PROTEIN: 5.5 g/dL — AB (ref 6.5–8.1)

## 2015-02-02 LAB — CBC
HCT: 36.6 % (ref 36.0–46.0)
HEMOGLOBIN: 11.9 g/dL — AB (ref 12.0–15.0)
MCH: 32.2 pg (ref 26.0–34.0)
MCHC: 32.5 g/dL (ref 30.0–36.0)
MCV: 98.9 fL (ref 78.0–100.0)
PLATELETS: 155 10*3/uL (ref 150–400)
RBC: 3.7 MIL/uL — ABNORMAL LOW (ref 3.87–5.11)
RDW: 14 % (ref 11.5–15.5)
WBC: 11.6 10*3/uL — ABNORMAL HIGH (ref 4.0–10.5)

## 2015-02-02 LAB — MAGNESIUM: Magnesium: 1.6 mg/dL — ABNORMAL LOW (ref 1.7–2.4)

## 2015-02-02 MED ORDER — MAGNESIUM SULFATE 2 GM/50ML IV SOLN
2.0000 g | Freq: Once | INTRAVENOUS | Status: AC
  Administered 2015-02-02: 2 g via INTRAVENOUS
  Filled 2015-02-02: qty 50

## 2015-02-02 MED ORDER — PANTOPRAZOLE SODIUM 40 MG IV SOLR
40.0000 mg | Freq: Two times a day (BID) | INTRAVENOUS | Status: DC
  Administered 2015-02-02 – 2015-02-06 (×10): 40 mg via INTRAVENOUS
  Filled 2015-02-02 (×12): qty 40

## 2015-02-02 NOTE — Progress Notes (Signed)
[facility] PROGRESS NOTE  Kara [name] ZOX:[id number] DOB: Jun 30, 1985 DOA: 01/29/2015 PCP:  Duane Lope, MD  HPI/Brief narrative 29yo female past medical history of hypertension social drinker which usually drinks over the weekend hard liquor regularly that comes in she started having left flank pain 24 hours prior to admission. Patient was found to have acute pancreatitis and was subsequently admitted for further work up.  Assessment/Plan: 1. Acute alcoholic pancreatitis with sepsis present on admission 1. Pt with tachycardia and leukocytosis on admit 2. Presenting lipase of 1069 that has improved to near normal with NPO and supportive care 3. Recent fevers of 102 were noted and primaxin continued. Blood cx neg x 2 4. Patient is complaining of increased flank/back pain. F/u CT abd on 10/15 with evidence of increasing peripancreatic fluid and loculated ascites/pseudocyst 5. Have consulted GI and appreciate input. Recs for trial of starting clears and if unable to tolerate, then consideration for NG feeds and if no significant improvement, then consideration for repeat CT in several days 2. Acute alcoholic hepatitis 1. Presenting LFT's elevated in ratio 2:1 2. LFT's steadily trending down 3. Cont supportive care 4. Advised patient to remain free from ETOH and she agrees 3. Fever 1. Tmax of 102F on 10/14 at 2138 2. CXR without findings of PNA 3. Blood cx - neg x 2 4. Will continue empiric Primaxin per above 4. Asthma 1. Remains stable. No wheezing 5. HTN 1. BP stable 2. Cont monitor 6. ETOH abuse 1. Cessation was done face to face 2. On CIWA 3. No evidence of withdrawals at this time. Last drink was on 10/10 7. DVT prophylaxis 1. Heparin subQ while admitted 8. Question of Portal Vein Thrombus? 1. Findings questionable for PVT on CT abd 2. MRA ordered to r/o thrombus  Code Status: Full Family Communication: Pt in room Disposition Plan:  Pending   Consultants:  Gastroenterology  Procedures:    Antibiotics: Anti-infectives    Start     Dose/Rate Route Frequency Ordered Stop   01/30/15 0000  imipenem-cilastatin (PRIMAXIN) 500 mg in sodium chloride 0.9 % 100 mL IVPB     500 mg 200 mL/hr over 30 Minutes Intravenous 4 times per day 01/29/15 1633     01/29/15 1645  imipenem-cilastatin (PRIMAXIN) 500 mg in sodium chloride 0.9 % 100 mL IVPB     500 mg 200 mL/hr over 30 Minutes Intravenous STAT 01/29/15 1633 01/29/15 1748      HPI/Subjective: Continues to complain of flank pain, requesting more narcotics  Objective: Filed Vitals:   02/01/15 1944 02/01/15 2140 02/02/15 0509 02/02/15 0906  BP:  124/86 121/88   Pulse:  119 118   Temp:  99.1 F (37.3 C) 99 F (37.2 C)   TempSrc:  Oral Oral   Resp:  18 18   Height:      Weight:   105.3 kg (232 lb 2.3 oz)   SpO2: 97% 96% 97% 95%    Intake/Output Summary (Last 24 hours) at 02/02/15 1313 Last data filed at 02/01/15 2145  Gross per 24 hour  Intake      0 ml  Output      2 ml  Net     -2 ml   Filed Weights   01/29/15 1617 02/02/15 0509  Weight: 94.756 kg (208 lb 14.4 oz) 105.3 kg (232 lb 2.3 oz)    Exam:   General:  Awake, laying in bed, in nad  Cardiovascular: regular, s1, s2  Respiratory: normal resp effort, no wheezing  Abdomen: soft,nondistended, decreased BS, diffusely tender  Musculoskeletal: perfused, no cyanosis  Data Reviewed: Basic Metabolic Panel:  Recent Labs Lab 01/29/15 1102 01/30/15 0510 01/31/15 0510 02/01/15 0611 02/02/15 0510  NA 138 142 137 135 133*  K 3.8 4.1 3.3* 3.4* 3.5  CL 100* 109 105 100* 101  CO2 20* 24 23 26 24   GLUCOSE 149* 96 98 117* 97  BUN 9 12 11 8 6   CREATININE 1.06* 0.74 0.62 0.51 0.70  CALCIUM 9.1 7.6* 7.5* 7.7* 8.0*  MG  --   --   --   --  1.6*   Liver Function Tests:  Recent Labs Lab 01/29/15 1102 01/30/15 0510 01/31/15 0510 02/01/15 0611 02/02/15 0510  AST 304* 159* 110* 101* 101*  ALT  132* 85* 61* 47 46  ALKPHOS 173* 135* 116 106 119  BILITOT 2.6* 2.9* 3.9* 4.1* 3.4*  PROT 8.1 6.4* 5.8* 5.5* 5.5*  ALBUMIN 3.7 3.0* 2.6* 2.4* 2.2*    Recent Labs Lab 01/29/15 1102 01/30/15 0510 01/31/15 0510  LIPASE 1069* 603* 97*   No results for input(s): AMMONIA in the last 168 hours. CBC:  Recent Labs Lab 01/29/15 1102 01/30/15 0510 01/31/15 0510 02/01/15 0611 02/02/15 0510  WBC 21.4* 16.1* 18.5* 13.6* 11.6*  NEUTROABS 19.6*  --   --   --   --   HGB 16.5* 14.4 13.5 12.4 11.9*  HCT 49.0* 45.3 42.0 38.0 36.6  MCV 98.2 101.6* 101.4* 99.0 98.9  PLT 295 208 159 136* 155   Cardiac Enzymes: No results for input(s): CKTOTAL, CKMB, CKMBINDEX, TROPONINI in the last 168 hours. BNP (last 3 results)  Recent Labs  07/06/14 1255  BNP 79.2    ProBNP (last 3 results) No results for input(s): PROBNP in the last 8760 hours.  CBG: No results for input(s): GLUCAP in the last 168 hours.  Recent Results (from the past 240 hour(s))  Culture, blood (routine x 2)     Status: None (Preliminary result)   Collection Time: 01/31/15 12:17 PM  Result Value Ref Range Status   Specimen Description BLOOD RIGHT ANTECUBITAL  Final   Special Requests BOTTLES DRAWN AEROBIC AND ANAEROBIC 10CC  Final   Culture   Final    NO GROWTH 1 DAY [facility]    Report Status PENDING  Incomplete  Culture, blood (routine x 2)     Status: None (Preliminary result)   Collection Time: 01/31/15 12:17 PM  Result Value Ref Range Status   Specimen Description BLOOD RIGHT HAND  Final   Special Requests BOTTLES DRAWN AEROBIC AND ANAEROBIC 5CC  Final   Culture   Final    NO GROWTH 1 DAY [facility]    Report Status PENDING  Incomplete     Studies: Ct Abdomen Pelvis W Contrast  02/01/2015  CLINICAL DATA:  Follow-up pancreatitis EXAM: CT ABDOMEN AND PELVIS WITH CONTRAST TECHNIQUE: Multidetector CT imaging of the abdomen and pelvis was performed using the standard  protocol following bolus administration of intravenous contrast. CONTRAST:  50mL OMNIPAQUE IOHEXOL 300 MG/ML SOLN, 100mL OMNIPAQUE IOHEXOL 300 MG/ML SOLN COMPARISON:  Ten/ 12/16 FINDINGS: There is small left pleural effusion. Trace right pleural effusion. Bilateral lower lobe posterior atelectasis. Again noted significant fatty infiltration of the liver. Heart size within normal limits. Again noted peripancreatic fluid and significant stranding of peripancreatic fat consistent with acute pancreatitis. There is increasing peripancreatic fluid from prior exam. Small amount of perisplenic ascites. There is moderate ascites in left  paracolic gutter. Small loculated fluid is noted adjacent to proximal gastric wall suspicious for loculated ascites or pancreatic pseudocyst. No definite pancreatic abscess is identified. The SMA and celiac trunk are patent. In axial image 35 there is filling defect in portal vein highly suspicious for nonocclusive thrombus. Similar appearance in axial image 36. The superior mesenteric vein is patent. No calcified gallstones are noted within gallbladder. No focal splenic mass. Kidneys are symmetrical in size. No hydronephrosis or hydroureter. Small amount of mesenteric ascites is noted adjacent to distal small bowel loops. There is moderate pelvic ascites. The uterus and ovaries are unremarkable. No distal colonic obstruction. Urinary bladder is unremarkable. There is no small bowel obstruction. No pericecal inflammation. The terminal ileum is unremarkable. IMPRESSION: 1. There is small bilateral pleural effusion left greater than right with bilateral lower lobe posterior atelectasis. Significant fatty infiltration of the liver again noted. Again noted significant peripancreatic stranding and there is increase in peripancreatic fluid. Findings are consistent with acute pancreatitis. 2. Small perisplenic ascites. Moderate ascites noted in left paracolic gutter. Moderate pelvic ascites. Small  ascites in lower mesentery adjacent to small bowel loops. 3. There is loculated fluid collection adjacent to proximal stomach just anterior to the spleen measures about 3.8 cm. This is suspicious for loculated ascites or pancreatic pseudocyst. 4. Question nonocclusive thrombosis in portal vein axial image 35 and 36. The superior mesenteric vein is patent. 5. No hydronephrosis or hydroureter. 6. No small bowel obstruction. Electronically Signed   By: Natasha Mead M.D.   On: 02/01/2015 12:48    Scheduled Meds: . chlorhexidine  15 mL Mouth Rinse BID  . folic acid  1 mg Oral Daily  . gabapentin  300 mg Oral QHS  . heparin  5,000 Units Subcutaneous 3 times per day  . imipenem-cilastatin  500 mg Intravenous 4 times per day  . metoprolol succinate  25 mg Oral Daily  . mometasone-formoterol  2 puff Inhalation BID  . montelukast  10 mg Oral Daily  . multivitamin with minerals  1 tablet Oral Daily  . pantoprazole (PROTONIX) IV  40 mg Intravenous Q12H  . thiamine  100 mg Oral Daily   Or  . thiamine  100 mg Intravenous Daily   Continuous Infusions:    Principal Problem:   Acute pancreatitis Active Problems:   Alcohol abuse   Elevated LFTs   Sinus tachycardia (HCC)   Leukocytosis   AKI (acute kidney injury) (HCC)   Kara [name] K  [facility] Pager [phone]. If 7PM-7AM, please contact night-coverage at [email or web address], password [facility] 02/02/2015, 1:13 PM  LOS: 4 days

## 2015-02-02 NOTE — Consult Note (Signed)
[facility] Consult Note  Referring Provider: No ref. provider found Primary Care Physician:   Duane Lope, MD Primary Gastroenterologist:  Dr.  Antony Contras Complaint: Abdominal pain nausea and vomiting HPI: Kara [name] is an 29 y.o. white female  who was admitted with alcoholic pancreatitis. She has a history of previous admission with severe abdominal pain nausea and vomiting and had grade 4 esophagitis on EGD on March 30. On current presentation she had a lipase greater than 1000 and a WBC count of 21,000 with mildly elevated LFTs including a bilirubin of 2.6. Her CT scan showed diffuse peripancreatic edema and some peripancreatic fluid. Her leukocytosis and lipase have normalized but her abdominal pain persists, primarily along the left flank. She did have a temperature spike to 102 2 nights ago. She has been on imipenem since initial presentation. A repeat CT scan showed some increase in the peripancreatic fluid with no necrosis. She has been nothing by mouth since admission. She is not on a proton pump inhibitor.  Past Medical History  Diagnosis Date  . Asthma   . GERD (gastroesophageal reflux disease)   . Alcohol abuse   . Environmental allergies   . Headache   . Hypertension   . Syncope and collapse   . Vision abnormalities   . Prolonged QT syndrome     Past Surgical History  Procedure Laterality Date  . Tympanostomy tube placement    . Esophagogastroduodenoscopy N/A 07/17/2014    Procedure: ESOPHAGOGASTRODUODENOSCOPY (EGD);  Surgeon: Dorena Cookey, MD;  Location: [facility];  Service: Endoscopy;  Laterality: N/A;    Medications Prior to Admission  Medication Sig Dispense Refill  . albuterol (PROVENTIL HFA;VENTOLIN HFA) 108 (90 BASE) MCG/ACT inhaler Inhale 2 puffs into the lungs every 6 (six) hours as needed for wheezing or shortness of breath (wheezing and sob).     . fexofenadine (ALLEGRA) 180 MG tablet Take 180 mg by mouth daily as needed for allergies (allergies).      . gabapentin (NEURONTIN) 300 MG capsule One in am, one in evening and two po at night 120 capsule 11  . metoprolol succinate (TOPROL-XL) 25 MG 24 hr tablet Take 1 tablet (25 mg total) by mouth daily. 30 tablet 11  . mometasone-formoterol (DULERA) 200-5 MCG/ACT AERO Inhale 2 puffs into the lungs 2 (two) times daily.    . montelukast (SINGULAIR) 10 MG tablet Take 10 mg by mouth daily.     . Multiple Vitamin (MULTIVITAMIN WITH MINERALS) TABS tablet Take 1 tablet by mouth daily. 30 tablet 0  . ALPRAZolam (XANAX) 0.25 MG tablet Take 1 tablet (0.25 mg total) by mouth 2 (two) times daily as needed for anxiety. (Patient not taking: Reported on 01/29/2015) 60 tablet 3  . pantoprazole (PROTONIX) 40 MG tablet Take 1 tablet (40 mg total) by mouth 2 (two) times daily. (Patient not taking: Reported on 10/29/2014) 60 tablet 0  . polyethylene glycol (MIRALAX / GLYCOLAX) packet Take 17 g by mouth daily as needed. (Patient not taking: Reported on 10/29/2014) 14 each 0  . sucralfate (CARAFATE) 1 GM/10ML suspension Take 10 mLs (1 g total) by mouth 4 (four) times daily -  with meals and at bedtime. (Patient not taking: Reported on 10/29/2014) 420 mL 0  . traMADol (ULTRAM) 50 MG tablet Take 1 tablet (50 mg total) by mouth 3 (three) times daily as needed. (Patient not taking: Reported on 10/29/2014) 90 tablet 0    Allergies:  Allergies  Allergen Reactions  . Peanuts [Peanut Oil] Itching  Sensitivity     Family History  Problem Relation Age of Onset  . Heart failure Mother   . Heart failure Father   . Heart failure Maternal Grandfather     Social History:  reports that she quit smoking about 2 years ago. Her smoking use included Cigarettes. She has a 1 pack-year smoking history. She has never used smokeless tobacco. She reports that she drinks about 1.8 oz of alcohol per week. She reports that she does not use illicit drugs.  Review of Systems: negative except as above   Blood pressure 121/88, pulse 118,  temperature 99 F (37.2 C), temperature source Oral, resp. rate 18, height $RemoveBe'5\' 5"'[id number]$  (1.651 m), weight 105.3 kg (232 lb 2.3 oz), last menstrual period 12/30/2014, SpO2 97 %. Head: Normocephalic, without obvious abnormality, atraumatic Neck: no adenopathy, no carotid bruit, no JVD, supple, symmetrical, trachea midline and thyroid not enlarged, symmetric, no tenderness/mass/nodules Resp: clear to auscultation bilaterally Cardio: regular rate and rhythm, S1, S2 normal, no murmur, click, rub or gallop GI: Abdomen soft moderately tender primarily in the left abdomen. No mass. Extremities: extremities normal, atraumatic, no cyanosis or edema  Results for orders placed or performed during the hospital encounter of 01/29/15 (from the past 48 hour(s))  Culture, blood (routine x 2)     Status: None (Preliminary result)   Collection Time: 01/31/15 12:17 PM  Result Value Ref Range   Specimen Description BLOOD RIGHT ANTECUBITAL    Special Requests BOTTLES DRAWN AEROBIC AND ANAEROBIC 10CC    Culture      NO GROWTH 1 DAY Performed at [facility]    Report Status PENDING   Culture, blood (routine x 2)     Status: None (Preliminary result)   Collection Time: 01/31/15 12:17 PM  Result Value Ref Range   Specimen Description BLOOD RIGHT HAND    Special Requests BOTTLES DRAWN AEROBIC AND ANAEROBIC 5CC    Culture      NO GROWTH 1 DAY Performed at [facility]    Report Status PENDING   Comprehensive metabolic panel     Status: Abnormal   Collection Time: 02/01/15  6:11 AM  Result Value Ref Range   Sodium 135 135 - 145 mmol/L   Potassium 3.4 (L) 3.5 - 5.1 mmol/L   Chloride 100 (L) 101 - 111 mmol/L   CO2 26 22 - 32 mmol/L   Glucose, Bld 117 (H) 65 - 99 mg/dL   BUN 8 6 - 20 mg/dL   Creatinine, Ser 0.51 0.44 - 1.00 mg/dL   Calcium 7.7 (L) 8.9 - 10.3 mg/dL   Total Protein 5.5 (L) 6.5 - 8.1 g/dL   Albumin 2.4 (L) 3.5 - 5.0 g/dL   AST 101 (H) 15 - 41 U/L   ALT 47 14 - 54 U/L   Alkaline  Phosphatase 106 38 - 126 U/L   Total Bilirubin 4.1 (H) 0.3 - 1.2 mg/dL   GFR calc non Af Amer >60 >60 mL/min   GFR calc Af Amer >60 >60 mL/min    Comment: (NOTE) The eGFR has been calculated using the CKD EPI equation. This calculation has not been validated in all clinical situations. eGFR's persistently <60 mL/min signify possible Chronic Kidney Disease.    Anion gap 9 5 - 15  CBC     Status: Abnormal   Collection Time: 02/01/15  6:11 AM  Result Value Ref Range   WBC 13.6 (H) 4.0 - 10.5 K/uL   RBC 3.84 (L) 3.87 -  5.11 MIL/uL   Hemoglobin 12.4 12.0 - 15.0 g/dL   HCT 38.0 36.0 - 46.0 %   MCV 99.0 78.0 - 100.0 fL   MCH 32.3 26.0 - 34.0 pg   MCHC 32.6 30.0 - 36.0 g/dL   RDW 13.7 11.5 - 15.5 %   Platelets 136 (L) 150 - 400 K/uL  Comprehensive metabolic panel     Status: Abnormal   Collection Time: 02/02/15  5:10 AM  Result Value Ref Range   Sodium 133 (L) 135 - 145 mmol/L   Potassium 3.5 3.5 - 5.1 mmol/L   Chloride 101 101 - 111 mmol/L   CO2 24 22 - 32 mmol/L   Glucose, Bld 97 65 - 99 mg/dL   BUN 6 6 - 20 mg/dL   Creatinine, Ser 0.70 0.44 - 1.00 mg/dL   Calcium 8.0 (L) 8.9 - 10.3 mg/dL   Total Protein 5.5 (L) 6.5 - 8.1 g/dL   Albumin 2.2 (L) 3.5 - 5.0 g/dL   AST 101 (H) 15 - 41 U/L   ALT 46 14 - 54 U/L   Alkaline Phosphatase 119 38 - 126 U/L   Total Bilirubin 3.4 (H) 0.3 - 1.2 mg/dL   GFR calc non Af Amer >60 >60 mL/min   GFR calc Af Amer >60 >60 mL/min    Comment: (NOTE) The eGFR has been calculated using the CKD EPI equation. This calculation has not been validated in all clinical situations. eGFR's persistently <60 mL/min signify possible Chronic Kidney Disease.    Anion gap 8 5 - 15  CBC     Status: Abnormal   Collection Time: 02/02/15  5:10 AM  Result Value Ref Range   WBC 11.6 (H) 4.0 - 10.5 K/uL   RBC 3.70 (L) 3.87 - 5.11 MIL/uL   Hemoglobin 11.9 (L) 12.0 - 15.0 g/dL   HCT 36.6 36.0 - 46.0 %   MCV 98.9 78.0 - 100.0 fL   MCH 32.2 26.0 - 34.0 pg   MCHC 32.5  30.0 - 36.0 g/dL   RDW 14.0 11.5 - 15.5 %   Platelets 155 150 - 400 K/uL  Magnesium     Status: Abnormal   Collection Time: 02/02/15  5:10 AM  Result Value Ref Range   Magnesium 1.6 (L) 1.7 - 2.4 mg/dL   Ct Abdomen Pelvis W Contrast  02/01/2015  CLINICAL DATA:  Follow-up pancreatitis EXAM: CT ABDOMEN AND PELVIS WITH CONTRAST TECHNIQUE: Multidetector CT imaging of the abdomen and pelvis was performed using the standard protocol following bolus administration of intravenous contrast. CONTRAST:  47mL OMNIPAQUE IOHEXOL 300 MG/ML SOLN, 14mL OMNIPAQUE IOHEXOL 300 MG/ML SOLN COMPARISON:  Ten/ 12/16 FINDINGS: There is small left pleural effusion. Trace right pleural effusion. Bilateral lower lobe posterior atelectasis. Again noted significant fatty infiltration of the liver. Heart size within normal limits. Again noted peripancreatic fluid and significant stranding of peripancreatic fat consistent with acute pancreatitis. There is increasing peripancreatic fluid from prior exam. Small amount of perisplenic ascites. There is moderate ascites in left paracolic gutter. Small loculated fluid is noted adjacent to proximal gastric wall suspicious for loculated ascites or pancreatic pseudocyst. No definite pancreatic abscess is identified. The SMA and celiac trunk are patent. In axial image 35 there is filling defect in portal vein highly suspicious for nonocclusive thrombus. Similar appearance in axial image 36. The superior mesenteric vein is patent. No calcified gallstones are noted within gallbladder. No focal splenic mass. Kidneys are symmetrical in size. No hydronephrosis or hydroureter. Small amount of  mesenteric ascites is noted adjacent to distal small bowel loops. There is moderate pelvic ascites. The uterus and ovaries are unremarkable. No distal colonic obstruction. Urinary bladder is unremarkable. There is no small bowel obstruction. No pericecal inflammation. The terminal ileum is unremarkable. IMPRESSION:  1. There is small bilateral pleural effusion left greater than right with bilateral lower lobe posterior atelectasis. Significant fatty infiltration of the liver again noted. Again noted significant peripancreatic stranding and there is increase in peripancreatic fluid. Findings are consistent with acute pancreatitis. 2. Small perisplenic ascites. Moderate ascites noted in left paracolic gutter. Moderate pelvic ascites. Small ascites in lower mesentery adjacent to small bowel loops. 3. There is loculated fluid collection adjacent to proximal stomach just anterior to the spleen measures about 3.8 cm. This is suspicious for loculated ascites or pancreatic pseudocyst. 4. Question nonocclusive thrombosis in portal vein axial image 35 and 36. The superior mesenteric vein is patent. 5. No hydronephrosis or hydroureter. 6. No small bowel obstruction. Electronically Signed   By: Lahoma Crocker M.D.   On: 02/01/2015 12:48   Dg Chest Port 1 View  01/31/2015  CLINICAL DATA:  Nonproductive cough. EXAM: PORTABLE CHEST 1 VIEW COMPARISON:  July 15, 2014. FINDINGS: The heart size and mediastinal contours are within normal limits. No pneumothorax is noted. Right lung is clear. Mild left basilar subsegmental atelectasis is noted. No significant pleural effusion is noted. The visualized skeletal structures are unremarkable. IMPRESSION: Mild left basilar subsegmental atelectasis. Electronically Signed   By: Marijo Conception, M.D.   On: 01/31/2015 12:36    Assessment: 1. Alcoholic pancreatitis, thus far without objective predictors of complications or poor outcome, but increasing fluid around the pancreas and possible early pseudocyst formation 2. History of severe erosive esophagitis presumably due to reflux. Plan:  1. Continue supportive care and imipenem given previous fever spikes. 2. Will allow clear liquid diet. If unable to tolerate will need to address nutrition preferably with nasoenteric feedings. 3. If pain does not  improve over the next several days may need a third CT scan 4. Will begin IV proton pump inhibitor 5. Will follow with you. Margarito Dehaas C 02/02/2015, 8:12 AM  Pager [phone] If no answer or after 5 PM call [phone]

## 2015-02-02 NOTE — Progress Notes (Signed)
Patient in shower.  I informed patient she does not have order to shower.  Patient reports to me "I am almost finished."

## 2015-02-03 ENCOUNTER — Inpatient Hospital Stay (HOSPITAL_COMMUNITY): Payer: BC Managed Care – PPO

## 2015-02-03 DIAGNOSIS — I82890 Acute embolism and thrombosis of other specified veins: Secondary | ICD-10-CM | POA: Insufficient documentation

## 2015-02-03 LAB — URINALYSIS, ROUTINE W REFLEX MICROSCOPIC
Glucose, UA: NEGATIVE mg/dL
Ketones, ur: 15 mg/dL — AB
NITRITE: NEGATIVE
PH: 6.5 (ref 5.0–8.0)
Protein, ur: 30 mg/dL — AB
SPECIFIC GRAVITY, URINE: 1.02 (ref 1.005–1.030)
UROBILINOGEN UA: 1 mg/dL (ref 0.0–1.0)

## 2015-02-03 LAB — CBC WITH DIFFERENTIAL/PLATELET
BASOS ABS: 0.1 10*3/uL (ref 0.0–0.1)
Basophils Relative: 1 %
EOS PCT: 5 %
Eosinophils Absolute: 0.6 10*3/uL (ref 0.0–0.7)
HEMATOCRIT: 34.1 % — AB (ref 36.0–46.0)
HEMOGLOBIN: 11.3 g/dL — AB (ref 12.0–15.0)
LYMPHS ABS: 2.1 10*3/uL (ref 0.7–4.0)
LYMPHS PCT: 18 %
MCH: 32.1 pg (ref 26.0–34.0)
MCHC: 33.1 g/dL (ref 30.0–36.0)
MCV: 96.9 fL (ref 78.0–100.0)
MONOS PCT: 20 %
Monocytes Absolute: 2.3 10*3/uL — ABNORMAL HIGH (ref 0.1–1.0)
Neutro Abs: 6.6 10*3/uL (ref 1.7–7.7)
Neutrophils Relative %: 56 %
Platelets: 193 10*3/uL (ref 150–400)
RBC: 3.52 MIL/uL — AB (ref 3.87–5.11)
RDW: 13.9 % (ref 11.5–15.5)
WBC: 11.7 10*3/uL — AB (ref 4.0–10.5)

## 2015-02-03 LAB — COMPREHENSIVE METABOLIC PANEL
ALT: 48 U/L (ref 14–54)
AST: 123 U/L — AB (ref 15–41)
Albumin: 2.2 g/dL — ABNORMAL LOW (ref 3.5–5.0)
Alkaline Phosphatase: 145 U/L — ABNORMAL HIGH (ref 38–126)
Anion gap: 10 (ref 5–15)
CHLORIDE: 101 mmol/L (ref 101–111)
CO2: 25 mmol/L (ref 22–32)
CREATININE: 0.49 mg/dL (ref 0.44–1.00)
Calcium: 8 mg/dL — ABNORMAL LOW (ref 8.9–10.3)
GFR calc Af Amer: >60 mL/min (ref >60)
GFR calc non Af Amer: >60 mL/min (ref >60)
Glucose, Bld: 115 mg/dL — ABNORMAL HIGH (ref 65–99)
POTASSIUM: 2.8 mmol/L — AB (ref 3.5–5.1)
SODIUM: 136 mmol/L (ref 135–145)
Total Bilirubin: 2.7 mg/dL — ABNORMAL HIGH (ref 0.3–1.2)
Total Protein: 5.4 g/dL — ABNORMAL LOW (ref 6.5–8.1)

## 2015-02-03 LAB — URINE MICROSCOPIC-ADD ON

## 2015-02-03 LAB — LIPASE, BLOOD: Lipase: 60 U/L — ABNORMAL HIGH (ref 22–51)

## 2015-02-03 LAB — MAGNESIUM: MAGNESIUM: 1.8 mg/dL (ref 1.7–2.4)

## 2015-02-03 MED ORDER — SODIUM CHLORIDE 0.9 % IV BOLUS (SEPSIS)
500.0000 mL | Freq: Once | INTRAVENOUS | Status: AC
  Administered 2015-02-03: 500 mL via INTRAVENOUS

## 2015-02-03 MED ORDER — POTASSIUM CHLORIDE CRYS ER 20 MEQ PO TBCR
40.0000 meq | EXTENDED_RELEASE_TABLET | Freq: Two times a day (BID) | ORAL | Status: AC
  Administered 2015-02-03 (×2): 40 meq via ORAL
  Filled 2015-02-03 (×2): qty 2

## 2015-02-03 MED ORDER — GADOBENATE DIMEGLUMINE 529 MG/ML IV SOLN
20.0000 mL | Freq: Once | INTRAVENOUS | Status: AC | PRN
  Administered 2015-02-03: 20 mL via INTRAVENOUS

## 2015-02-03 MED ORDER — LORAZEPAM 2 MG/ML IJ SOLN
1.0000 mg | Freq: Once | INTRAMUSCULAR | Status: DC | PRN
  Administered 2015-02-03: 1 mg via INTRAVENOUS
  Filled 2015-02-03: qty 1

## 2015-02-03 MED ORDER — HEPARIN (PORCINE) IN NACL 100-0.45 UNIT/ML-% IJ SOLN
1400.0000 [IU]/h | INTRAMUSCULAR | Status: DC
  Administered 2015-02-03: 1400 [IU]/h via INTRAVENOUS
  Filled 2015-02-03: qty 250

## 2015-02-03 MED ORDER — ACETAMINOPHEN 325 MG PO TABS
650.0000 mg | ORAL_TABLET | ORAL | Status: DC | PRN
  Administered 2015-02-03: 650 mg via ORAL
  Filled 2015-02-03: qty 2

## 2015-02-03 MED ORDER — MAGNESIUM SULFATE 2 GM/50ML IV SOLN
2.0000 g | Freq: Once | INTRAVENOUS | Status: AC
  Administered 2015-02-03: 2 g via INTRAVENOUS
  Filled 2015-02-03: qty 50

## 2015-02-03 NOTE — Progress Notes (Signed)
ANTICOAGULATION CONSULT NOTE - Initial Consult  Pharmacy Consult for Heparin Indication: Portal Vein Thrombosis  Allergies  Allergen Reactions  . Peanuts [Peanut Oil] Itching    Sensitivity     Patient Measurements: Height: 5\' 5"  (165.1 cm) Weight: 233 lb 7.5 oz (105.9 kg) IBW/kg (Calculated) : 57 Heparin Dosing Weight: 82 kg  Vital Signs: Temp: 99.1 F (37.3 C) (10/17 1356) Temp Source: Oral (10/17 1356) BP: 121/84 mmHg (10/17 1356) Pulse Rate: 129 (10/17 1356)  Labs:  Recent Labs  02/01/15 0611 02/02/15 0510 02/03/15 0525  HGB 12.4 11.9* 11.3*  HCT 38.0 36.6 34.1*  PLT 136* 155 193  CREATININE 0.51 0.70 0.49    Estimated Creatinine Clearance: 124.3 mL/min (by C-G formula based on Cr of 0.49).   Medical History: Past Medical History  Diagnosis Date  . Asthma   . GERD (gastroesophageal reflux disease)   . Alcohol abuse   . Environmental allergies   . Headache   . Hypertension   . Syncope and collapse   . Vision abnormalities   . Prolonged QT syndrome     Medications:  Scheduled:  . chlorhexidine  15 mL Mouth Rinse BID  . folic acid  1 mg Oral Daily  . gabapentin  300 mg Oral QHS  . imipenem-cilastatin  500 mg Intravenous 4 times per day  . metoprolol succinate  25 mg Oral Daily  . mometasone-formoterol  2 puff Inhalation BID  . montelukast  10 mg Oral Daily  . multivitamin with minerals  1 tablet Oral Daily  . pantoprazole (PROTONIX) IV  40 mg Intravenous Q12H  . potassium chloride  40 mEq Oral BID  . thiamine  100 mg Oral Daily   Or  . thiamine  100 mg Intravenous Daily   Infusions:  . heparin      Assessment:  29 yr female with left flank pain.  H/O HTN, social alcohol use.  Was admitted for acute pancreatitis with sepsis and known to pharmacy for Primaxin dosing.  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

## 2015-02-03 NOTE — Care Management Note (Signed)
Case Management Note  Patient Details  Name: Karma Lewmanda Stovall MRN: [id number] Date of Birth: 09/21/1985  Subjective/Objective:             etoh abuse with pancreatitis       Action/Plan:Date: February 03, 2015 Chart reviewed for concurrent status and case management needs. Will continue to follow patient for changes and needs: Marcelle Smilinghonda Davis, RN, BSN, [place]   [phone]   Expected Discharge Date:   (unknown)               Expected Discharge Plan:  Home/Self Care  In-House Referral:  NA  Discharge planning Services  CM Consult  Post Acute Care Choice:  NA Choice offered to:  NA  DME Arranged:    DME Agency:     HH Arranged:    HH Agency:     Status of Service:  In process, will continue to follow  Medicare Important Message Given:    Date Medicare IM Given:    Medicare IM give by:    Date Additional Medicare IM Given:    Additional Medicare Important Message give by:     If discussed at Long Length of Stay Meetings, dates discussed:    Additional Comments:  Golda AcreDavis, Rhonda Lynn, RN 02/03/2015, 10:19 AM

## 2015-02-03 NOTE — Progress Notes (Addendum)
[facility] PROGRESS NOTE  Kara [name] ZOX:[id number] DOB: Jul 10, 1985 DOA: 01/29/2015 PCP:  Duane Lope, MD  HPI/Brief narrative 29yo female past medical history of hypertension social drinker which usually drinks over the weekend hard liquor regularly that comes in she started having left flank pain 24 hours prior to admission. Patient was found to have acute pancreatitis and was subsequently admitted for further work up.  Assessment/Plan: 1. Acute alcoholic pancreatitis with sepsis present on admission 1. Pt with tachycardia and leukocytosis on admit 2. Presenting lipase of 1069 that has improved to near normal with NPO and supportive care 3. Recent fevers of 102 were noted and primaxin continued. Blood cx neg x 2 4. Patient had complained of increased flank/back pain, thus a f/u CT abd was done on 10/15 with evidence of increasing peripancreatic fluid and loculated ascites/pseudocyst 5. GI is following and appreciate input. Recs to continue clears as tolerated 2. Acute alcoholic hepatitis 1. Presenting LFT's elevated in ratio 2:1 2. LFT's steadily trending down 3. Cont supportive care 4. Advised patient to remain free from ETOH and she agrees 3. Fever 1. Tmax of 102F on 10/14 at 2138 2. CXR without findings of PNA 3. Blood cx - neg x 2 4. Continue empiric Primaxin per above 5. ? septic splenic/portal vein thrombus (see below) 4. Asthma 1. Remains stable. No wheezing 5. HTN 1. BP stable 2. Cont monitor 6. ETOH abuse 1. Cessation was done face to face 2. On CIWA 3. No evidence of withdrawals at this time. Last drink was on 10/10 7. DVT prophylaxis 1. Heparin subQ initially 2. Transition to therapeutic heparin per below 8. Splenic Vein Thrombosis 1. Findings of questionable PVT on recent CT abd 2. Follow up MRA on 10/17 was positive for splenic vein thrombus extending into portal vein and proximal SMV 3. Suspect related to presenting severe pancreatitis vs alcoholic  liver disease 4. Start on heparin gtt  Code Status: Full Family Communication: Pt in room Disposition Plan: Pending   Consultants:  Gastroenterology  Procedures:    Antibiotics: Anti-infectives    Start     Dose/Rate Route Frequency Ordered Stop   01/30/15 0000  imipenem-cilastatin (PRIMAXIN) 500 mg in sodium chloride 0.9 % 100 mL IVPB     500 mg 200 mL/hr over 30 Minutes Intravenous 4 times per day 01/29/15 1633     01/29/15 1645  imipenem-cilastatin (PRIMAXIN) 500 mg in sodium chloride 0.9 % 100 mL IVPB     500 mg 200 mL/hr over 30 Minutes Intravenous STAT 01/29/15 1633 01/29/15 1748      HPI/Subjective: Reports continued pain requiring dilaudid. Tolerated clears yesterday, but reported dry heaving this AM.  Objective: Filed Vitals:   02/02/15 2054 02/03/15 0500 02/03/15 0836 02/03/15 1356  BP: 123/83 110/80  121/84  Pulse: 115 107  129  Temp: 100.9 F (38.3 C) 99.6 F (37.6 C)  99.1 F (37.3 C)  TempSrc: Oral Oral  Oral  Resp: Height:      Weight:  105.9 kg (233 lb 7.5 oz)    SpO2: 97% 98% 96% 97%    Intake/Output Summary (Last 24 hours) at 02/03/15 1656 Last data filed at 02/02/15 1800  Gross per 24 hour  Intake    240 ml  Output      1 ml  Net    239 ml   Filed Weights   01/29/15 1617 02/02/15 0509 02/03/15 0500  Weight: 94.756 kg (208 lb 14.4 oz) 105.3 kg (232  lb 2.3 oz) 105.9 kg (233 lb 7.5 oz)    Exam:   General:  Asleep, easily arousable, laying in bed, in nad  Cardiovascular: regular, s1, s2  Respiratory: normal resp effort, no wheezing  Abdomen: soft,nondistended, decreased BS, diffusely tender  Musculoskeletal: perfused, no cyanosis, no clubbing  Data Reviewed: Basic Metabolic Panel:  Recent Labs Lab 01/30/15 0510 01/31/15 0510 02/01/15 0611 02/02/15 0510 02/03/15 0525  NA 142 137 135 133* 136  K 4.1 3.3* 3.4* 3.5 2.8*  CL 109 105 100* 101 101  CO2 GLUCOSE 96 98 117* 97 115*  BUN <5*  CREATININE 0.74 0.62 0.51 0.70 0.49  CALCIUM 7.6* 7.5* 7.7* 8.0* 8.0*  MG  --   --   --  1.6* 1.8   Liver Function Tests:  Recent Labs Lab 01/30/15 0510 01/31/15 0510 02/01/15 0611 02/02/15 0510 02/03/15 0525  AST 159* 110* 101* 101* 123*  ALT 85* 61* 47 46 48  ALKPHOS 135* 116 106 119 145*  BILITOT 2.9* 3.9* 4.1* 3.4* 2.7*  PROT 6.4* 5.8* 5.5* 5.5* 5.4*  ALBUMIN 3.0* 2.6* 2.4* 2.2* 2.2*    Recent Labs Lab 01/29/15 1102 01/30/15 0510 01/31/15 0510 02/03/15 0525  LIPASE 1069* 603* 97* 60*   No results for input(s): AMMONIA in the last 168 hours. CBC:  Recent Labs Lab 01/29/15 1102 01/30/15 0510 01/31/15 0510 02/01/15 0611 02/02/15 0510 02/03/15 0525  WBC 21.4* 16.1* 18.5* 13.6* 11.6* 11.7*  NEUTROABS 19.6*  --   --   --   --  6.6  HGB 16.5* 14.4 13.5 12.4 11.9* 11.3*  HCT 49.0* 45.3 42.0 38.0 36.6 34.1*  MCV 98.2 101.6* 101.4* 99.0 98.9 96.9  PLT 295 208 159 136* 155 193   Cardiac Enzymes: No results for input(s): CKTOTAL, CKMB, CKMBINDEX, TROPONINI in the last 168 hours. BNP (last 3 results)  Recent Labs  07/06/14 1255  BNP 79.2    ProBNP (last 3 results) No results for input(s): PROBNP in the last 8760 hours.  CBG: No results for input(s): GLUCAP in the last 168 hours.  Recent Results (from the past 240 hour(s))  Culture, blood (routine x 2)     Status: None (Preliminary result)   Collection Time: 01/31/15 12:17 PM  Result Value Ref Range Status   Specimen Description BLOOD RIGHT ANTECUBITAL  Final   Special Requests BOTTLES DRAWN AEROBIC AND ANAEROBIC 10CC  Final   Culture   Final    NO GROWTH 3 DAYS Performed at [facility]    Report Status PENDING  Incomplete  Culture, blood (routine x 2)     Status: None (Preliminary result)   Collection Time: 01/31/15 12:17 PM  Result Value Ref Range Status   Specimen Description BLOOD RIGHT HAND  Final   Special Requests BOTTLES DRAWN AEROBIC AND ANAEROBIC 5CC  Final   Culture   Final     NO GROWTH 3 DAYS Performed at [facility]    Report Status PENDING  Incomplete     Studies: Mr Abdomen W Wo Contrast  02/03/2015  CLINICAL DATA:  Portal vein thrombosis questioned on recent CT scan. Pancreatitis. EXAM: MRI ABDOMEN WITHOUT AND WITH CONTRAST TECHNIQUE: Multiplanar multisequence MR imaging of the abdomen was performed both before and after the administration of intravenous contrast. CONTRAST:  20mL MULTIHANCE GADOBENATE DIMEGLUMINE 529 MG/ML IV SOLN COMPARISON:  02/01/2015 FINDINGS: Lower chest: Moderate left and small right pleural effusions with associated passive  atelectasis. Hepatobiliary: Diffuse hepatic steatosis with extensive dropout of signal in the hepatic parenchyma on out of phase images compared in phase images. Pancreas: Suspected pancreatic pseudocyst tracking along the left lateral border of the stomach, one measuring about 4.5 cm diameter and the other measuring about 3.5 cm diameter, with adjoining peripancreatic fluid collection on image 17 series 8. Additional peripancreatic acute fluid collection extending caudad along the left anterior retroperitoneal margin. There is increased T2 signal in the pancreas but I do not observe a pancreatic mass. Spleen: Unremarkable Adrenals/Urinary Tract: Unremarkable Stomach/Bowel: Aside from the suspected pseudocysts noted above, no significant abnormality. Vascular/Lymphatic: Thrombosed splenic vein, new from 01/29/2015 but stable compared to 02/01/2015. The thrombosis of the splenic vein extends to the confluence of the superior mesenteric vein in the splenic vein, with a small amount of nonocclusive clot extending down into the SMV and into the portal vein confluence. Other: No supplemental non-categorized findings. Musculoskeletal: Unremarkable IMPRESSION: 1. Splenic vein thrombosis/occlusion. Thrombus in the splenic vein extends into the portal vein confluence, with small amounts of nonocclusive thrombus extending in the  portal vein and proximal SMV. 2. Diffuse hepatic steatosis. 3. Pancreatitis with acute peripancreatic fluid collections and probable early pseudocyst formation along the left lateral border of the stomach. Fluid collection with somewhat irregular enhancing margins tracks around the anterior perirenal fascia all margin ; given the enhancing margins, early abscess formation is not readily excluded. I do not see pancreatic parenchymal necrosis or an abscess involving the pancreatic parenchyma itself. Electronically Signed   By: Gaylyn RongWalter  Liebkemann M.D.   On: 02/03/2015 15:20    Scheduled Meds: . chlorhexidine  15 mL Mouth Rinse BID  . folic acid  1 mg Oral Daily  . gabapentin  300 mg Oral QHS  . imipenem-cilastatin  500 mg Intravenous 4 times per day  . metoprolol succinate  25 mg Oral Daily  . mometasone-formoterol  2 puff Inhalation BID  . montelukast  10 mg Oral Daily  . multivitamin with minerals  1 tablet Oral Daily  . pantoprazole (PROTONIX) IV  40 mg Intravenous Q12H  . potassium chloride  40 mEq Oral BID  . thiamine  100 mg Oral Daily   Or  . thiamine  100 mg Intravenous Daily   Continuous Infusions:    Principal Problem:   Acute pancreatitis Active Problems:   Alcohol abuse   Elevated LFTs   Sinus tachycardia (HCC)   Leukocytosis   AKI (acute kidney injury) (HCC)   Tomasz Steeves K  [facility] Pager [phone]. If 7PM-7AM, please contact night-coverage at [email or web address], password [email or web address] 02/03/2015, 4:56 PM  LOS: 5 days

## 2015-02-03 NOTE — Progress Notes (Signed)
Patient ID: Kara [name], female   DOB: 04/04/1986, 29 y.o.   MRN: [id number]  Came to see pt for f/u of her pancreatitis. Patient in radiology undergoing MRA to evaluate for a portal vein thrombus. Will f/u again tomorrow. Continue clear liquid diet and supportive care.

## 2015-02-04 DIAGNOSIS — D735 Infarction of spleen: Secondary | ICD-10-CM

## 2015-02-04 LAB — COMPREHENSIVE METABOLIC PANEL
ALT: 54 U/L (ref 14–54)
ANION GAP: 7 (ref 5–15)
AST: 121 U/L — ABNORMAL HIGH (ref 15–41)
Albumin: 2.5 g/dL — ABNORMAL LOW (ref 3.5–5.0)
Alkaline Phosphatase: 189 U/L — ABNORMAL HIGH (ref 38–126)
BUN: <5 mg/dL — ABNORMAL LOW (ref 6–20)
CHLORIDE: 104 mmol/L (ref 101–111)
CO2: 26 mmol/L (ref 22–32)
CREATININE: 0.56 mg/dL (ref 0.44–1.00)
Calcium: 8.2 mg/dL — ABNORMAL LOW (ref 8.9–10.3)
GFR calc non Af Amer: >60 mL/min (ref >60)
Glucose, Bld: 111 mg/dL — ABNORMAL HIGH (ref 65–99)
POTASSIUM: 2.9 mmol/L — AB (ref 3.5–5.1)
SODIUM: 137 mmol/L (ref 135–145)
Total Bilirubin: 2.7 mg/dL — ABNORMAL HIGH (ref 0.3–1.2)
Total Protein: 6.2 g/dL — ABNORMAL LOW (ref 6.5–8.1)

## 2015-02-04 LAB — CBC
HCT: 38 % (ref 36.0–46.0)
HEMOGLOBIN: 12.5 g/dL (ref 12.0–15.0)
MCH: 32.4 pg (ref 26.0–34.0)
MCHC: 32.9 g/dL (ref 30.0–36.0)
MCV: 98.4 fL (ref 78.0–100.0)
PLATELETS: 314 10*3/uL (ref 150–400)
RBC: 3.86 MIL/uL — ABNORMAL LOW (ref 3.87–5.11)
RDW: 14.3 % (ref 11.5–15.5)
WBC: 12.6 10*3/uL — ABNORMAL HIGH (ref 4.0–10.5)

## 2015-02-04 LAB — URINE CULTURE: CULTURE: NO GROWTH

## 2015-02-04 LAB — HEPARIN LEVEL (UNFRACTIONATED)
HEPARIN UNFRACTIONATED: 0.28 [IU]/mL — AB (ref 0.30–0.70)
HEPARIN UNFRACTIONATED: 0.32 [IU]/mL (ref 0.30–0.70)
HEPARIN UNFRACTIONATED: 0.4 [IU]/mL (ref 0.30–0.70)

## 2015-02-04 LAB — MAGNESIUM: MAGNESIUM: 2 mg/dL (ref 1.7–2.4)

## 2015-02-04 LAB — ANTITHROMBIN III: ANTITHROMB III FUNC: 67 % — AB (ref 75–120)

## 2015-02-04 MED ORDER — HEPARIN (PORCINE) IN NACL 100-0.45 UNIT/ML-% IJ SOLN
1500.0000 [IU]/h | INTRAMUSCULAR | Status: DC
  Administered 2015-02-04 – 2015-02-05 (×2): 1500 [IU]/h via INTRAVENOUS
  Filled 2015-02-04 (×5): qty 250

## 2015-02-04 MED ORDER — POTASSIUM CHLORIDE 10 MEQ/100ML IV SOLN
10.0000 meq | INTRAVENOUS | Status: AC
Start: 2015-02-04 — End: 2015-02-04
  Administered 2015-02-04 (×5): 10 meq via INTRAVENOUS
  Filled 2015-02-04 (×5): qty 100

## 2015-02-04 NOTE — Progress Notes (Signed)
Initial Nutrition Assessment  DOCUMENTATION CODES:   Obesity unspecified  INTERVENTION:  - Advance diet as medically feasible - RD will continue to monitor for needsd  NUTRITION DIAGNOSIS:   Inadequate protein intake related to acute illness, other (see comment) (current diet order) as evidenced by other (see comment) (CLD does not meet protein needs).  GOAL:   Patient will meet greater than or equal to 90% of their needs  MONITOR:   Diet advancement, PO intake, Weight trends, Labs, I & O's  REASON FOR ASSESSMENT:   Consult Assessment of nutrition requirement/status  ASSESSMENT:   29 y.o. female past medical history of hypertension social drinker which usually drinks over the weekend hard liquor regularly that comes in she started having left flank pain 24 hours prior to admission. She relates is progressively been getting worse she has not been able to tolerate anything by mouth not even water. She has not been able to take her medication. She relates she has been vomiting since then not able to keep anything down. She denies any burning when she urinates any change in her urine, she relates her last drink was on Monday. She denies any diarrhea any urinary symptoms.  Pt seen for consult. BMI indicates obesity. Pt reports she does not like many of the items on CLD but has been consuming juice, [place] ice, and ginger ale. Per chart review, she consumed 100% breakfast and lunch yesterday. She denies abdominal pain, nausea, or emesis with intakes or even without intakes. She states all pain is in her L flank. Pt states the only time she felt nauseated and emesis was the day before admission through the day of admission. She states she has GERD and it is usual for her to cough up phlegm each morning; this occurred this AM.   Pt states good appetite PTA including the day of admission. She is hopeful for diet advancement as she wants something more than liquids. She states that PTA her  weight fluctuated related to stress. Per chart review, pt has gained 27 lbs in the past 3 months. No muscle or fat wasting noted.  Not able to meet needs on CLD. Will monitor for diet advancement as pt not interested in supplements. Medications reviewed. Labs reviewed; K: 2.9 mmol/L, BUN <5, Ca: 8.2 mg/dL.   Diet Order:  Diet clear liquid Room service appropriate?: Yes; Fluid consistency:: Thin  Skin:  Reviewed, no issues  Last BM:  10/16  Height:   Ht Readings from Last 1 Encounters:  01/29/15 5\' 5"  (1.651 m)    Weight:   Wt Readings from Last 1 Encounters:  02/03/15 233 lb 7.5 oz (105.9 kg)    Ideal Body Weight:  63.64 kg (kg)  BMI:  Body mass index is 38.85 kg/(m^2).  Estimated Nutritional Needs:   Kcal:  1600-1800  Protein:  70-80 grams  Fluid:  2-2.5 L/day  EDUCATION NEEDS:   No education needs identified at this time     Trenton GammonJessica Danajah Birdsell, RD, LDN Inpatient Clinical Dietitian Pager # [phone] After hours/weekend pager # [phone]

## 2015-02-04 NOTE — Progress Notes (Signed)
ANTIBIOTIC CONSULT NOTE - Follow up  Pharmacy Consult for Primaxin Indication: Intra-abdominal infection  Allergies  Allergen Reactions  . Peanuts [Peanut Oil] Itching    Sensitivity     Patient Measurements: Height: 5\' 5"  (165.1 cm) Weight: 233 lb 7.5 oz (105.9 kg) IBW/kg (Calculated) : 57  Vital Signs: Temp: 99.6 F (37.6 C) (10/18 0515) Temp Source: Oral (10/18 0515) BP: 103/60 mmHg (10/18 0515) Pulse Rate: 103 (10/18 0515) Intake/Output from previous day: 10/17 0701 - 10/18 0700 In: 800 [P.O.:600; IV Piggyback:200] Out: 3 [Urine:3]  Labs:  Recent Labs  02/02/15 0510 02/03/15 0525 02/04/15 0805  WBC 11.6* 11.7* 12.6*  HGB 11.9* 11.3* 12.5  PLT 155 193 314  CREATININE 0.70 0.49 0.56   Estimated Creatinine Clearance: 124.3 mL/min (by C-G formula based on Cr of 0.56).  Assessment: 29 yoF presents with left flank pain and vomiting, found to have acute pancreatitis secondary to alcohol abuse with extensive peripancreatic fluid extending into LUQ and down left pericolic. Pharmacy consulted to dose Primaxin for intra-abdominal infection. MRA 10/17 pancreatitis w/ acute peripancreatic fluid collections and probable early pseudocyst formation along the left lateral border of the stomach.  10/12 >> Primaxin  >>  Today, 02/04/2015: Day #7 Primaxin  Tmax 99.6  WBC elevated/ trending up, 12.6  SCr 0.56 with CrCl > 100 ml/min  Micro: 10/14 blood: ngtd 10/17 urine: ngf  Goal of Therapy:  Appropriate abx dosing, eradication of infection.   Plan:   Continue Primaxin 500mg  IV q6h  F/u renal function, cultures, clinical course   Charolette ChildEthan Sofiah Lyne, PharmD Candidate 02/04/2015 10:18 AM

## 2015-02-04 NOTE — Progress Notes (Signed)
ANTICOAGULATION CONSULT NOTE - Follow-up  Pharmacy Consult for Heparin Indication: Portal Vein Thrombosis  Heparin level 0.4 on 1500units/hr. No bleeding reported/documented.  Goal of Therapy:  Heparin level 0.3-0.7 units/ml Monitor platelets by anticoagulation protocol: Yes   Plan:  Continue Hep gtt @ 1500 units/hr Follow CBC and heparin level daily  Charolotte Ekeom Huxton Glaus, PharmD, pager [phone]. 02/04/2015,3:47 PM.

## 2015-02-04 NOTE — Progress Notes (Signed)
[facility] PROGRESS NOTE Subjective patient still having some pain. Imaging studies show pancreatic pseudocysts with thrombosis of the splenic vein extending to confluence of the SMV. Confirmed by MRI. Patient on clear liquids but still needing pain medication.  Objective: Vital signs in last 24 hours: Temp:  [99.1 F (37.3 C)-101 F (38.3 C)] 99.6 F (37.6 C) (10/18 0515) Pulse Rate:  [103-129] 103 (10/18 0515) Resp:  [18-20] 20 (10/18 0515) BP: (103-121)/(60-84) 103/60 mmHg (10/18 0515) SpO2:  [96 %-99 %] 98 % (10/18 0515) Weight:  [105.688 kg (233 lb)] 105.688 kg (233 lb) (10/17 1420) Last BM Date: 02/02/15 (per pt)  Intake/Output from previous day: 10/17 0701 - 10/18 0700 In: 800 [P.O.:600; IV Piggyback:200] Out: 3 [Urine:3] Intake/Output this shift:    PE: General-- obese white female distress  Abdomen-- mild epigastric and left upper quadrant tenderness bowel sounds present  Lab Results:  Recent Labs  02/02/15 0510 02/03/15 0525 02/04/15 0805  WBC 11.6* 11.7* 12.6*  HGB 11.9* 11.3* 12.5  HCT 36.6 34.1* 38.0  PLT 155 193 314   BMET  Recent Labs  02/02/15 0510 02/03/15 0525  NA 133* 136  K 3.5 2.8*  CL 101 101  CO2 24 25  CREATININE 0.70 0.49   LFT  Recent Labs  02/02/15 0510 02/03/15 0525  PROT 5.5* 5.4*  AST 101* 123*  ALT 46 48  ALKPHOS 119 145*  BILITOT 3.4* 2.7*   PT/INR No results for input(s): LABPROT, INR in the last 72 hours. PANCREAS  Recent Labs  02/03/15 0525  LIPASE 60*         Studies/Results: Mr Abdomen W Wo Contrast  02/03/2015  CLINICAL DATA:  Portal vein thrombosis questioned on recent CT scan. Pancreatitis. EXAM: MRI ABDOMEN WITHOUT AND WITH CONTRAST TECHNIQUE: Multiplanar multisequence MR imaging of the abdomen was performed both before and after the administration of intravenous contrast. CONTRAST:  20mL MULTIHANCE GADOBENATE DIMEGLUMINE 529 MG/ML IV SOLN COMPARISON:  02/01/2015 FINDINGS: Lower  chest: Moderate left and small right pleural effusions with associated passive atelectasis. Hepatobiliary: Diffuse hepatic steatosis with extensive dropout of signal in the hepatic parenchyma on out of phase images compared in phase images. Pancreas: Suspected pancreatic pseudocyst tracking along the left lateral border of the stomach, one measuring about 4.5 cm diameter and the other measuring about 3.5 cm diameter, with adjoining peripancreatic fluid collection on image 17 series 8. Additional peripancreatic acute fluid collection extending caudad along the left anterior retroperitoneal margin. There is increased T2 signal in the pancreas but I do not observe a pancreatic mass. Spleen: Unremarkable Adrenals/Urinary Tract: Unremarkable Stomach/Bowel: Aside from the suspected pseudocysts noted above, no significant abnormality. Vascular/Lymphatic: Thrombosed splenic vein, new from 01/29/2015 but stable compared to 02/01/2015. The thrombosis of the splenic vein extends to the confluence of the superior mesenteric vein in the splenic vein, with a small amount of nonocclusive clot extending down into the SMV and into the portal vein confluence. Other: No supplemental non-categorized findings. Musculoskeletal: Unremarkable IMPRESSION: 1. Splenic vein thrombosis/occlusion. Thrombus in the splenic vein extends into the portal vein confluence, with small amounts of nonocclusive thrombus extending in the portal vein and proximal SMV. 2. Diffuse hepatic steatosis. 3. Pancreatitis with acute peripancreatic fluid collections and probable early pseudocyst formation along the left lateral border of the stomach. Fluid collection with somewhat irregular enhancing margins tracks around the anterior perirenal fascia all margin ; given the enhancing margins, early abscess formation is not readily excluded. I do not see pancreatic parenchymal necrosis  or an abscess involving the pancreatic parenchyma itself. Electronically Signed    By: Gaylyn RongWalter  Liebkemann M.D.   On: 02/03/2015 15:20    Medications: I have reviewed the patient's current medications.  Assessment/Plan: 1. Alcoholic pancreatitis. Complicated by probable sepsis and splenic vein thrombosis. Agree with initiating anticoagulation. For now, would keep on fat-free clear liquids and control pain. Hopefully the pseudocyst will shrink with time or wall off and be amenable to percutaneous drainage. Have discussed with the patient. She will likely need to be on anticoagulation for some time and would recommend checking hypercoagulable panel just be sure   Jaleeah Slight JR,Lonia Roane L 02/04/2015, 8:33 AM  Pager: [phone] If no answer or after hours call [phone]

## 2015-02-04 NOTE — Progress Notes (Signed)
ANTICOAGULATION CONSULT NOTE - Follow Up Consult  Pharmacy Consult for Heparin Indication: Portal Vein Thrombosis  Allergies  Allergen Reactions  . Peanuts [Peanut Oil] Itching    Sensitivity     Patient Measurements: Height: 5\' 5"  (165.1 cm) Weight: 233 lb 7.5 oz (105.9 kg) IBW/kg (Calculated) : 57 Heparin Dosing Weight:   Vital Signs: Temp: 101 F (38.3 C) (10/17 2105) Temp Source: Oral (10/17 2105) BP: 113/78 mmHg (10/17 2105) Pulse Rate: 117 (10/17 2105)  Labs:  Recent Labs  02/01/15 0611 02/02/15 0510 02/03/15 0525 02/04/15 0005  HGB 12.4 11.9* 11.3*  --   HCT 38.0 36.6 34.1*  --   PLT 136* 155 193  --   HEPARINUNFRC  --   --   --  0.32  CREATININE 0.51 0.70 0.49  --     Estimated Creatinine Clearance: 124.3 mL/min (by C-G formula based on Cr of 0.49).   Medications:  Infusions:  . heparin      Assessment: Patient with heparin at goal.  No heparin issues per RN. While at goal, level is on lower end of goal.  Goal of Therapy:  Heparin level 0.3-0.7 units/ml Monitor platelets by anticoagulation protocol: Yes   Plan:  Increase heparin to 1500 units/hr to keep within goal range. Check heparin level at 0800  Darlina GuysGrimsley Jr, Jacquenette ShoneJulian Crowford 02/04/2015,12:53 AM

## 2015-02-04 NOTE — Progress Notes (Signed)
[facility] PROGRESS NOTE  Kara [name] ZOX:[id number] DOB: February 03, 1986 DOA: 01/29/2015 PCP:  Duane Lope, MD  HPI/Brief narrative 29yo female past medical history of hypertension social drinker which usually drinks over the weekend hard liquor regularly that comes in she started having left flank pain 24 hours prior to admission. Patient was found to have acute pancreatitis and was subsequently admitted for further work up.  Assessment/Plan: 1. Acute alcoholic pancreatitis with sepsis present on admission 1. Pt with tachycardia and leukocytosis on admit 2. Presenting lipase of 1069 that has improved to near normal with NPO and supportive care 3. Tmax of 101F overnight. Blood cx neg x 2 4. Patient had earlier complained of increased flank/back pain, thus a f/u CT abd was done on 10/15 with evidence of increasing peripancreatic fluid and loculated ascites/pseudocyst 5. GI is continuing to follow and appreciate input. Recs to continue clears as tolerated 2. Acute alcoholic hepatitis 1. Presenting LFT's elevated in ratio 2:1 2. LFT's had trended down 3. Cont supportive care 4. Advised patient to remain free from ETOH and she agrees 3. Fever 1. Tmax of 101F overnight 2. CXR without findings of PNA 3. Blood cx - neg x 2 4. ? septic splenic/portal vein thrombus (see below) 5. Continue primaxin for now 4. Asthma 1. Remains stable. No wheezing 5. HTN 1. BP stable 2. Cont monitor 6. ETOH abuse 1. Cessation was done face to face 2. On CIWA 3. No evidence of withdrawals at this time. Last drink was on 10/10 7. DVT prophylaxis 1. On therapeutic heparin 8. Splenic Vein Thrombosis 1. Findings of questionable PVT on recent CT abd 2. Follow up MRA on 10/17 was positive for splenic vein thrombus extending into portal vein and proximal SMV 3. Suspect related to presenting severe pancreatitis 4. Now on heparin gtt 5. Hypercoag panel ordered, pending 6. Consider transition to oral  anticoagulant when more stable  Code Status: Full Family Communication: Pt in room Disposition Plan: Pending   Consultants:  Gastroenterology  Procedures:    Antibiotics: Anti-infectives    Start     Dose/Rate Route Frequency Ordered Stop   01/30/15 0000  imipenem-cilastatin (PRIMAXIN) 500 mg in sodium chloride 0.9 % 100 mL IVPB     500 mg 200 mL/hr over 30 Minutes Intravenous 4 times per day 01/29/15 1633     01/29/15 1645  imipenem-cilastatin (PRIMAXIN) 500 mg in sodium chloride 0.9 % 100 mL IVPB     500 mg 200 mL/hr over 30 Minutes Intravenous STAT 01/29/15 1633 01/29/15 1748      HPI/Subjective: Patient continues with L flank pain and nausea this AM  Objective: Filed Vitals:   02/03/15 1356 02/03/15 2105 02/04/15 0515 02/04/15 1014  BP: 121/84 113/78 103/60   Pulse: 129 117 103   Temp: 99.1 F (37.3 C) 101 F (38.3 C) 99.6 F (37.6 C)   TempSrc: Oral Oral Oral   Resp: Height:      Weight:      SpO2: 97% 99% 98% 97%    Intake/Output Summary (Last 24 hours) at 02/04/15 1332 Last data filed at 02/04/15 0900  Gross per 24 hour  Intake    440 ml  Output      3 ml  Net    437 ml   Filed Weights   01/29/15 1617 02/02/15 0509 02/03/15 0500  Weight: 94.756 kg (208 lb 14.4 oz) 105.3 kg (232 lb 2.3 oz) 105.9 kg (233 lb 7.5 oz)  Exam:   General:  Asleep, easily arousable, laying in bed, in nad  Cardiovascular: regular, s1, s2  Respiratory: normal resp effort, no wheezing  Abdomen: soft,nondistended, decreased BS, diffusely tender, worse on L UQ  Musculoskeletal: perfused, no cyanosis, no clubbing  Data Reviewed: Basic Metabolic Panel:  Recent Labs Lab 01/31/15 0510 02/01/15 0611 02/02/15 0510 02/03/15 0525 02/04/15 0805  NA 137 135 133* 136 137  K 3.3* 3.4* 3.5 2.8* 2.9*  CL 105 100* 101 101 104  CO2 23 26 24 25 26   GLUCOSE 98 117* 97 115* 111*  BUN 11 8 6  <5* <5*  CREATININE 0.62 0.51 0.70 0.49 0.56  CALCIUM 7.5* 7.7* 8.0*  8.0* 8.2*  MG  --   --  1.6* 1.8 2.0   Liver Function Tests:  Recent Labs Lab 01/31/15 0510 02/01/15 0611 02/02/15 0510 02/03/15 0525 02/04/15 0805  AST 110* 101* 101* 123* 121*  ALT 61* 47 46 48 54  ALKPHOS 116 106 119 145* 189*  BILITOT 3.9* 4.1* 3.4* 2.7* 2.7*  PROT 5.8* 5.5* 5.5* 5.4* 6.2*  ALBUMIN 2.6* 2.4* 2.2* 2.2* 2.5*    Recent Labs Lab 01/29/15 1102 01/30/15 0510 01/31/15 0510 02/03/15 0525  LIPASE 1069* 603* 97* 60*   No results for input(s): AMMONIA in the last 168 hours. CBC:  Recent Labs Lab 01/29/15 1102  01/31/15 0510 02/01/15 0611 02/02/15 0510 02/03/15 0525 02/04/15 0805  WBC 21.4*  < > 18.5* 13.6* 11.6* 11.7* 12.6*  NEUTROABS 19.6*  --   --   --   --  6.6  --   HGB 16.5*  < > 13.5 12.4 11.9* 11.3* 12.5  HCT 49.0*  < > 42.0 38.0 36.6 34.1* 38.0  MCV 98.2  < > 101.4* 99.0 98.9 96.9 98.4  PLT 295  < > 159 136* 155 193 314  < > = values in this interval not displayed. Cardiac Enzymes: No results for input(s): CKTOTAL, CKMB, CKMBINDEX, TROPONINI in the last 168 hours. BNP (last 3 results)  Recent Labs  07/06/14 1255  BNP 79.2    ProBNP (last 3 results) No results for input(s): PROBNP in the last 8760 hours.  CBG: No results for input(s): GLUCAP in the last 168 hours.  Recent Results (from the past 240 hour(s))  Culture, blood (routine x 2)     Status: None (Preliminary result)   Collection Time: 01/31/15 12:17 PM  Result Value Ref Range Status   Specimen Description BLOOD RIGHT ANTECUBITAL  Final   Special Requests BOTTLES DRAWN AEROBIC AND ANAEROBIC 10CC  Final   Culture   Final    NO GROWTH 4 DAYS Performed at [facility]    Report Status PENDING  Incomplete  Culture, blood (routine x 2)     Status: None (Preliminary result)   Collection Time: 01/31/15 12:17 PM  Result Value Ref Range Status   Specimen Description BLOOD RIGHT HAND  Final   Special Requests BOTTLES DRAWN AEROBIC AND ANAEROBIC 5CC  Final   Culture    Final    NO GROWTH 4 DAYS Performed at [facility]    Report Status PENDING  Incomplete  Culture, Urine     Status: None   Collection Time: 02/03/15  3:47 AM  Result Value Ref Range Status   Specimen Description URINE, CLEAN CATCH  Final   Special Requests NONE  Final   Culture   Final    NO GROWTH 1 DAY Performed at [facility]  Report Status 02/04/2015 FINAL  Final     Studies: Mr Abdomen W Wo Contrast  02/03/2015  CLINICAL DATA:  Portal vein thrombosis questioned on recent CT scan. Pancreatitis. EXAM: MRI ABDOMEN WITHOUT AND WITH CONTRAST TECHNIQUE: Multiplanar multisequence MR imaging of the abdomen was performed both before and after the administration of intravenous contrast. CONTRAST:  20mL MULTIHANCE GADOBENATE DIMEGLUMINE 529 MG/ML IV SOLN COMPARISON:  02/01/2015 FINDINGS: Lower chest: Moderate left and small right pleural effusions with associated passive atelectasis. Hepatobiliary: Diffuse hepatic steatosis with extensive dropout of signal in the hepatic parenchyma on out of phase images compared in phase images. Pancreas: Suspected pancreatic pseudocyst tracking along the left lateral border of the stomach, one measuring about 4.5 cm diameter and the other measuring about 3.5 cm diameter, with adjoining peripancreatic fluid collection on image 17 series 8. Additional peripancreatic acute fluid collection extending caudad along the left anterior retroperitoneal margin. There is increased T2 signal in the pancreas but I do not observe a pancreatic mass. Spleen: Unremarkable Adrenals/Urinary Tract: Unremarkable Stomach/Bowel: Aside from the suspected pseudocysts noted above, no significant abnormality. Vascular/Lymphatic: Thrombosed splenic vein, new from 01/29/2015 but stable compared to 02/01/2015. The thrombosis of the splenic vein extends to the confluence of the superior mesenteric vein in the splenic vein, with a small amount of nonocclusive clot extending down  into the SMV and into the portal vein confluence. Other: No supplemental non-categorized findings. Musculoskeletal: Unremarkable IMPRESSION: 1. Splenic vein thrombosis/occlusion. Thrombus in the splenic vein extends into the portal vein confluence, with small amounts of nonocclusive thrombus extending in the portal vein and proximal SMV. 2. Diffuse hepatic steatosis. 3. Pancreatitis with acute peripancreatic fluid collections and probable early pseudocyst formation along the left lateral border of the stomach. Fluid collection with somewhat irregular enhancing margins tracks around the anterior perirenal fascia all margin ; given the enhancing margins, early abscess formation is not readily excluded. I do not see pancreatic parenchymal necrosis or an abscess involving the pancreatic parenchyma itself. Electronically Signed   By: Gaylyn Rong M.D.   On: 02/03/2015 15:20    Scheduled Meds: . chlorhexidine  15 mL Mouth Rinse BID  . folic acid  1 mg Oral Daily  . gabapentin  300 mg Oral QHS  . imipenem-cilastatin  500 mg Intravenous 4 times per day  . metoprolol succinate  25 mg Oral Daily  . mometasone-formoterol  2 puff Inhalation BID  . montelukast  10 mg Oral Daily  . multivitamin with minerals  1 tablet Oral Daily  . pantoprazole (PROTONIX) IV  40 mg Intravenous Q12H  . potassium chloride  10 mEq Intravenous Q1 Hr x 5  . thiamine  100 mg Oral Daily   Or  . thiamine  100 mg Intravenous Daily   Continuous Infusions: . heparin 1,500 Units/hr (02/04/15 1041)    Principal Problem:   Acute pancreatitis Active Problems:   Alcohol abuse   Elevated LFTs   Sinus tachycardia (HCC)   Leukocytosis   AKI (acute kidney injury) (HCC)   Splenic vein thrombosis   Morris Longenecker K  [facility] Pager [phone]. If 7PM-7AM, please contact night-coverage at [email or web address], password [facility] 02/04/2015, 1:32 PM  LOS: 6 days

## 2015-02-04 NOTE — Progress Notes (Signed)
ANTICOAGULATION CONSULT NOTE - Follow-up Consult  Pharmacy Consult for Heparin Indication: Portal Vein Thrombosis  Allergies  Allergen Reactions  . Peanuts [Peanut Oil] Itching    Sensitivity     Patient Measurements: Height: 5\' 5"  (165.1 cm) Weight: 233 lb 7.5 oz (105.9 kg) IBW/kg (Calculated) : 57 Heparin Dosing Weight: 82 kg  Vital Signs: Temp: 99.6 F (37.6 C) (10/18 0515) Temp Source: Oral (10/18 0515) BP: 103/60 mmHg (10/18 0515) Pulse Rate: 103 (10/18 0515)  Labs:  Recent Labs  02/02/15 0510 02/03/15 0525 02/04/15 0005 02/04/15 0805  HGB 11.9* 11.3*  --  12.5  HCT 36.6 34.1*  --  38.0  PLT 155 193  --  314  HEPARINUNFRC  --   --  0.32 0.40  CREATININE 0.70 0.49  --  0.56    Estimated Creatinine Clearance: 124.3 mL/min (by C-G formula based on Cr of 0.56).   Medical History: Past Medical History  Diagnosis Date  . Asthma   . GERD (gastroesophageal reflux disease)   . Alcohol abuse   . Environmental allergies   . Headache   . Hypertension   . Syncope and collapse   . Vision abnormalities   . Prolonged QT syndrome     Medications:  Scheduled:  . chlorhexidine  15 mL Mouth Rinse BID  . folic acid  1 mg Oral Daily  . gabapentin  300 mg Oral QHS  . imipenem-cilastatin  500 mg Intravenous 4 times per day  . metoprolol succinate  25 mg Oral Daily  . mometasone-formoterol  2 puff Inhalation BID  . montelukast  10 mg Oral Daily  . multivitamin with minerals  1 tablet Oral Daily  . pantoprazole (PROTONIX) IV  40 mg Intravenous Q12H  . thiamine  100 mg Oral Daily   Or  . thiamine  100 mg Intravenous Daily   Infusions:  . heparin 1,500 Units/hr (02/04/15 0109)    Assessment:  29 yr female with left flank pain.  H/O HTN, social alcohol use.  Was admitted for acute pancreatitis with sepsis and known to pharmacy for Primaxin dosing.  Has been on Heparin 5000 units sq q8h for VTE prophylaxis  MRI of abdomen (10/17) shows + portal vein  thrombus  Pharmacy consulted to dose IV heparin for treatment of portal vein thrombus  Hep lvl was 0.32 on Hep gtt @ 1400 units/hr, dose was increased to 1500 units/hr   Today, 02/04/2015 CBC: Hgb 12.5, Plt 314 Hep lvl therapeutic @ 1500 units/hr x1 No bleeding reported SrCr 0.56, CrCl 124.3   Goal of Therapy:  Heparin level 0.3-0.7 units/ml Monitor platelets by anticoagulation protocol: Yes   Plan:   Continue Hep gtt @ 1500 units/hr  Check heparin level @ 1400 to see if it remains at goal  Follow CBC and heparin level daily  Charolette ChildHammer, Willye Javier, PharmD Candidate 02/04/2015,10:02 AM

## 2015-02-05 DIAGNOSIS — F1010 Alcohol abuse, uncomplicated: Secondary | ICD-10-CM

## 2015-02-05 DIAGNOSIS — D72829 Elevated white blood cell count, unspecified: Secondary | ICD-10-CM

## 2015-02-05 DIAGNOSIS — K852 Alcohol induced acute pancreatitis without necrosis or infection: Secondary | ICD-10-CM

## 2015-02-05 DIAGNOSIS — N179 Acute kidney failure, unspecified: Secondary | ICD-10-CM

## 2015-02-05 DIAGNOSIS — R7989 Other specified abnormal findings of blood chemistry: Secondary | ICD-10-CM

## 2015-02-05 LAB — CULTURE, BLOOD (ROUTINE X 2)
CULTURE: NO GROWTH
CULTURE: NO GROWTH

## 2015-02-05 LAB — COMPREHENSIVE METABOLIC PANEL
ALBUMIN: 2.2 g/dL — AB (ref 3.5–5.0)
ALT: 43 U/L (ref 14–54)
ANION GAP: 7 (ref 5–15)
AST: 86 U/L — ABNORMAL HIGH (ref 15–41)
Alkaline Phosphatase: 200 U/L — ABNORMAL HIGH (ref 38–126)
BILIRUBIN TOTAL: 1.9 mg/dL — AB (ref 0.3–1.2)
BUN: <5 mg/dL — ABNORMAL LOW (ref 6–20)
CHLORIDE: 104 mmol/L (ref 101–111)
CO2: 26 mmol/L (ref 22–32)
Calcium: 8.2 mg/dL — ABNORMAL LOW (ref 8.9–10.3)
Creatinine, Ser: 0.55 mg/dL (ref 0.44–1.00)
GFR calc Af Amer: >60 mL/min (ref >60)
GFR calc non Af Amer: >60 mL/min (ref >60)
GLUCOSE: 115 mg/dL — AB (ref 65–99)
POTASSIUM: 3.4 mmol/L — AB (ref 3.5–5.1)
SODIUM: 137 mmol/L (ref 135–145)
TOTAL PROTEIN: 5.5 g/dL — AB (ref 6.5–8.1)

## 2015-02-05 LAB — PROTIME-INR
INR: 1.13 (ref 0.00–1.49)
Prothrombin Time: 14.6 seconds (ref 11.6–15.2)

## 2015-02-05 LAB — CBC
HCT: 33.9 % — ABNORMAL LOW (ref 36.0–46.0)
Hemoglobin: 11.2 g/dL — ABNORMAL LOW (ref 12.0–15.0)
MCH: 32.6 pg (ref 26.0–34.0)
MCHC: 33 g/dL (ref 30.0–36.0)
MCV: 98.5 fL (ref 78.0–100.0)
PLATELETS: 334 10*3/uL (ref 150–400)
RBC: 3.44 MIL/uL — ABNORMAL LOW (ref 3.87–5.11)
RDW: 14.4 % (ref 11.5–15.5)
WBC: 14.8 10*3/uL — AB (ref 4.0–10.5)

## 2015-02-05 LAB — BETA-2-GLYCOPROTEIN I ABS, IGG/M/A

## 2015-02-05 LAB — PROTEIN C, TOTAL: Protein C, Total: 72 % (ref 70–140)

## 2015-02-05 LAB — HEPARIN LEVEL (UNFRACTIONATED)
HEPARIN UNFRACTIONATED: 0.3 [IU]/mL (ref 0.30–0.70)
Heparin Unfractionated: 0.36 IU/mL (ref 0.30–0.70)

## 2015-02-05 LAB — HOMOCYSTEINE: HOMOCYSTEINE-NORM: 9 umol/L (ref 0.0–15.0)

## 2015-02-05 MED ORDER — WARFARIN - PHARMACIST DOSING INPATIENT
Freq: Every day | Status: DC
  Administered 2015-02-06: 18:00:00

## 2015-02-05 MED ORDER — WARFARIN SODIUM 10 MG PO TABS
10.0000 mg | ORAL_TABLET | Freq: Once | ORAL | Status: AC
  Administered 2015-02-05: 10 mg via ORAL
  Filled 2015-02-05: qty 1

## 2015-02-05 MED ORDER — WARFARIN VIDEO: Freq: Once | Status: DC

## 2015-02-05 MED ORDER — COUMADIN BOOK
Freq: Once | Status: AC
  Administered 2015-02-05: 17:00:00
  Filled 2015-02-05: qty 1

## 2015-02-05 MED ORDER — OXYCODONE HCL 5 MG PO TABS
5.0000 mg | ORAL_TABLET | Freq: Four times a day (QID) | ORAL | Status: DC | PRN
  Administered 2015-02-05 – 2015-02-09 (×13): 5 mg via ORAL
  Filled 2015-02-05 (×13): qty 1

## 2015-02-05 MED ORDER — HEPARIN (PORCINE) IN NACL 100-0.45 UNIT/ML-% IJ SOLN
1650.0000 [IU]/h | INTRAMUSCULAR | Status: AC
  Administered 2015-02-05 – 2015-02-06 (×2): 1650 [IU]/h via INTRAVENOUS
  Filled 2015-02-05 (×3): qty 250

## 2015-02-05 NOTE — Progress Notes (Addendum)
ANTICOAGULATION CONSULT NOTE - Follow-up heparin level  Pharmacy Consult for Heparin  Indication: Portal Vein Thrombosis  See full note from pharmacist earlier to today for full details  Heparin gtt rate increased earlier today for heparin level at low-end therapeutic range  Heparin level therapeutic at 0.36 this evening on 1650 units/hr  Plan:  Continue heparin at current rate  Follow up with daily heparin level and CBC  Order daily PT/INR for am  Juliette Alcideustin Zeigler, PharmD, BCPS.   Pager: [phone] 02/05/2015 5:35 PM

## 2015-02-05 NOTE — Progress Notes (Addendum)
[facility] PROGRESS NOTE  Kara [name] ZOX:[id number] DOB: Apr 14, 1986 DOA: 01/29/2015 PCP:  Duane Lope, MD  HPI/Brief narrative 29yo female past medical history of hypertension social drinker which usually drinks over the weekend hard liquor regularly that comes in she started having left flank pain 24 hours prior to admission. Patient was found to have acute pancreatitis and was subsequently admitted for further work up.  Assessment/Plan: 1. Severe Alcoholic pancreatitis  -complicated by Pseudocysts and Splenic vein thrombosis -slowly improving now -tolerating clears, supportive care, add PO meds -also on IV imipenem for fevers -will d/w GI regarding Abx   2. Acute alcoholic hepatitis -improving, counseled -LFTs improving  3. Fever -related to pancreatitis -currently on Imipenem, Tmax was 101 on 10/17  4. Asthma -Remains stable. No wheezing  5. HTN -BP stable  6. ETOH abuse       -no withdrawals noted, Thiamine, counseled  7. Splenic Vein Thrombosis 1. ? PVT on recent CT abd and FU MRA on 10/17 was notable for splenic vein thrombus extending into portal vein and proximal SMV -currently on heparin gtt -anticoagulation is controversial in this setting -d/w GI Dr.Edwards, recommends PO anticoagulation for few months and re-assess due to risk of acute portal vein thrombosis -discussed risks and benefits with patient will start Warfarin  Code Status: Full Family Communication: Pt in room Disposition Plan: home in ?2days   Consultants:  Gastroenterology  Procedures:    Antibiotics: Anti-infectives    Start     Dose/Rate Route Frequency Ordered Stop   01/30/15 0000  imipenem-cilastatin (PRIMAXIN) 500 mg in sodium chloride 0.9 % 100 mL IVPB     500 mg 200 mL/hr over 30 Minutes Intravenous 4 times per day 01/29/15 1633     01/29/15 1645  imipenem-cilastatin (PRIMAXIN) 500 mg in sodium chloride 0.9 % 100 mL IVPB     500 mg 200 mL/hr over 30 Minutes  Intravenous STAT 01/29/15 1633 01/29/15 1748      HPI/Subjective: Pain better, tolerating clears, no vomiting, Bm yetserday  Objective: Filed Vitals:   02/04/15 2014 02/04/15 2135 02/05/15 0557 02/05/15 0946  BP:  120/78 121/88 114/80  Pulse:  110 103 103  Temp:  99.8 F (37.7 C) 98.8 F (37.1 C)   TempSrc:  Oral Oral   Resp:  18 16   Height:      Weight:   104.877 kg (231 lb 3.4 oz)   SpO2: 98% 95% 99%    No intake or output data in the 24 hours ending 02/05/15 1034 Filed Weights   02/02/15 0509 02/03/15 0500 02/05/15 0557  Weight: 105.3 kg (232 lb 2.3 oz) 105.9 kg (233 lb 7.5 oz) 104.877 kg (231 lb 3.4 oz)    Exam:   General:  Asleep, easily arousable, laying in bed, in nad  Cardiovascular: regular, s1, s2  Respiratory: normal resp effort, no wheezing  Abdomen: soft,nondistended, decreased BS, non tender  Musculoskeletal: perfused, no cyanosis, no clubbing  Data Reviewed: Basic Metabolic Panel:  Recent Labs Lab 02/01/15 0611 02/02/15 0510 02/03/15 0525 02/04/15 0805 02/05/15 0530  NA 135 133* 136 137 137  K 3.4* 3.5 2.8* 2.9* 3.4*  CL 100* 101 101 104 104  CO2 GLUCOSE 117* 97 115* 111* 115*  BUN 8 6 <5* <5* <5*  CREATININE 0.51 0.70 0.49 0.56 0.55  CALCIUM 7.7* 8.0* 8.0* 8.2* 8.2*  MG  --  1.6* 1.8 2.0  --    Liver Function Tests:  Recent Labs  Lab 02/01/15 0611 02/02/15 0510 02/03/15 0525 02/04/15 0805 02/05/15 0530  AST 101* 101* 123* 121* 86*  ALT 47 46 48 54 43  ALKPHOS 106 119 145* 189* 200*  BILITOT 4.1* 3.4* 2.7* 2.7* 1.9*  PROT 5.5* 5.5* 5.4* 6.2* 5.5*  ALBUMIN 2.4* 2.2* 2.2* 2.5* 2.2*    Recent Labs Lab 01/29/15 1102 01/30/15 0510 01/31/15 0510 02/03/15 0525  LIPASE 1069* 603* 97* 60*   No results for input(s): AMMONIA in the last 168 hours. CBC:  Recent Labs Lab 01/29/15 1102  02/01/15 0611 02/02/15 0510 02/03/15 0525 02/04/15 0805 02/05/15 0530  WBC 21.4*  < > 13.6* 11.6* 11.7* 12.6* 14.8*   NEUTROABS 19.6*  --   --   --  6.6  --   --   HGB 16.5*  < > 12.4 11.9* 11.3* 12.5 11.2*  HCT 49.0*  < > 38.0 36.6 34.1* 38.0 33.9*  MCV 98.2  < > 99.0 98.9 96.9 98.4 98.5  PLT 295  < > 136* 155 193 314 334  < > = values in this interval not displayed. Cardiac Enzymes: No results for input(s): CKTOTAL, CKMB, CKMBINDEX, TROPONINI in the last 168 hours. BNP (last 3 results)  Recent Labs  07/06/14 1255  BNP 79.2    ProBNP (last 3 results) No results for input(s): PROBNP in the last 8760 hours.  CBG: No results for input(s): GLUCAP in the last 168 hours.  Recent Results (from the past 240 hour(s))  Culture, blood (routine x 2)     Status: None (Preliminary result)   Collection Time: 01/31/15 12:17 PM  Result Value Ref Range Status   Specimen Description BLOOD RIGHT ANTECUBITAL  Final   Special Requests BOTTLES DRAWN AEROBIC AND ANAEROBIC 10CC  Final   Culture   Final    NO GROWTH 4 DAYS Performed at [facility]    Report Status PENDING  Incomplete  Culture, blood (routine x 2)     Status: None (Preliminary result)   Collection Time: 01/31/15 12:17 PM  Result Value Ref Range Status   Specimen Description BLOOD RIGHT HAND  Final   Special Requests BOTTLES DRAWN AEROBIC AND ANAEROBIC 5CC  Final   Culture   Final    NO GROWTH 4 DAYS Performed at [facility]    Report Status PENDING  Incomplete  Culture, Urine     Status: None   Collection Time: 02/03/15  3:47 AM  Result Value Ref Range Status   Specimen Description URINE, CLEAN CATCH  Final   Special Requests NONE  Final   Culture   Final    NO GROWTH 1 DAY Performed at [facility]    Report Status 02/04/2015 FINAL  Final     Studies: Mr Abdomen W Wo Contrast  02/03/2015  CLINICAL DATA:  Portal vein thrombosis questioned on recent CT scan. Pancreatitis. EXAM: MRI ABDOMEN WITHOUT AND WITH CONTRAST TECHNIQUE: Multiplanar multisequence MR imaging of the abdomen was performed both before and  after the administration of intravenous contrast. CONTRAST:  20mL MULTIHANCE GADOBENATE DIMEGLUMINE 529 MG/ML IV SOLN COMPARISON:  02/01/2015 FINDINGS: Lower chest: Moderate left and small right pleural effusions with associated passive atelectasis. Hepatobiliary: Diffuse hepatic steatosis with extensive dropout of signal in the hepatic parenchyma on out of phase images compared in phase images. Pancreas: Suspected pancreatic pseudocyst tracking along the left lateral border of the stomach, one measuring about 4.5 cm diameter and the other measuring about 3.5 cm diameter, with adjoining peripancreatic fluid collection  on image 17 series 8. Additional peripancreatic acute fluid collection extending caudad along the left anterior retroperitoneal margin. There is increased T2 signal in the pancreas but I do not observe a pancreatic mass. Spleen: Unremarkable Adrenals/Urinary Tract: Unremarkable Stomach/Bowel: Aside from the suspected pseudocysts noted above, no significant abnormality. Vascular/Lymphatic: Thrombosed splenic vein, new from 01/29/2015 but stable compared to 02/01/2015. The thrombosis of the splenic vein extends to the confluence of the superior mesenteric vein in the splenic vein, with a small amount of nonocclusive clot extending down into the SMV and into the portal vein confluence. Other: No supplemental non-categorized findings. Musculoskeletal: Unremarkable IMPRESSION: 1. Splenic vein thrombosis/occlusion. Thrombus in the splenic vein extends into the portal vein confluence, with small amounts of nonocclusive thrombus extending in the portal vein and proximal SMV. 2. Diffuse hepatic steatosis. 3. Pancreatitis with acute peripancreatic fluid collections and probable early pseudocyst formation along the left lateral border of the stomach. Fluid collection with somewhat irregular enhancing margins tracks around the anterior perirenal fascia all margin ; given the enhancing margins, early abscess  formation is not readily excluded. I do not see pancreatic parenchymal necrosis or an abscess involving the pancreatic parenchyma itself. Electronically Signed   By: Gaylyn Rong M.D.   On: 02/03/2015 15:20    Scheduled Meds: . chlorhexidine  15 mL Mouth Rinse BID  . folic acid  1 mg Oral Daily  . gabapentin  300 mg Oral QHS  . imipenem-cilastatin  500 mg Intravenous 4 times per day  . metoprolol succinate  25 mg Oral Daily  . mometasone-formoterol  2 puff Inhalation BID  . montelukast  10 mg Oral Daily  . multivitamin with minerals  1 tablet Oral Daily  . pantoprazole (PROTONIX) IV  40 mg Intravenous Q12H  . thiamine  100 mg Oral Daily   Or  . thiamine  100 mg Intravenous Daily   Continuous Infusions: . heparin 1,650 Units/hr (02/05/15 0934)    Principal Problem:   Acute pancreatitis Active Problems:   Alcohol abuse   Elevated LFTs   Sinus tachycardia (HCC)   Leukocytosis   AKI (acute kidney injury) (HCC)   Splenic vein thrombosis   [facility]  [facility] Pager [phone]. If 7PM-7AM, please contact night-coverage at [email or web address], password [facility] 02/05/2015, 10:34 AM  LOS: 7 days

## 2015-02-05 NOTE — Progress Notes (Signed)
[facility] PROGRESS NOTE Subjective patient still having pain in requiring pain medications about every 3 hours IV. She is tolerating fat-free clear liquids.  Objective: Vital signs in last 24 hours: Temp:  [98.8 F (37.1 C)-99.8 F (37.7 C)] 98.8 F (37.1 C) (10/19 0557) Pulse Rate:  [97-110] 103 (10/19 0557) Resp:  [16-18] 16 (10/19 0557) BP: (106-121)/(65-88) 121/88 mmHg (10/19 0557) SpO2:  [95 %-100 %] 99 % (10/19 0557) Weight:  [104.877 kg (231 lb 3.4 oz)] 104.877 kg (231 lb 3.4 oz) (10/19 0557) Last BM Date: 02/02/15 (per pt)  Intake/Output from previous day: 10/18 0701 - 10/19 0700 In: 240 [P.O.:240] Out: 2 [Urine:2] Intake/Output this shift:    PE: General-- no acute distress - Abdomen-- obese slightly distended mild epigastric more pronounced left upper quadrant left flank tenderness. Few bowel sounds.  Lab Results:  Recent Labs  02/03/15 0525 02/04/15 0805 02/05/15 0530  WBC 11.7* 12.6* 14.8*  HGB 11.3* 12.5 11.2*  HCT 34.1* 38.0 33.9*  PLT 193 314 334   BMET  Recent Labs  02/03/15 0525 02/04/15 0805 02/05/15 0530  NA 136 137 137  K 2.8* 2.9* 3.4*  CL 101 104 104  CO2 25 26 26   CREATININE 0.49 0.56 0.55   LFT  Recent Labs  02/03/15 0525 02/04/15 0805 02/05/15 0530  PROT 5.4* 6.2* 5.5*  AST 123* 121* 86*  ALT 48 54 43  ALKPHOS 145* 189* 200*  BILITOT 2.7* 2.7* 1.9*   PT/INR No results for input(s): LABPROT, INR in the last 72 hours. PANCREAS  Recent Labs  02/03/15 0525  LIPASE 60*         Studies/Results: Mr Abdomen W Wo Contrast  02/03/2015  CLINICAL DATA:  Portal vein thrombosis questioned on recent CT scan. Pancreatitis. EXAM: MRI ABDOMEN WITHOUT AND WITH CONTRAST TECHNIQUE: Multiplanar multisequence MR imaging of the abdomen was performed both before and after the administration of intravenous contrast. CONTRAST:  20mL MULTIHANCE GADOBENATE DIMEGLUMINE 529 MG/ML IV SOLN COMPARISON:  02/01/2015 FINDINGS:  Lower chest: Moderate left and small right pleural effusions with associated passive atelectasis. Hepatobiliary: Diffuse hepatic steatosis with extensive dropout of signal in the hepatic parenchyma on out of phase images compared in phase images. Pancreas: Suspected pancreatic pseudocyst tracking along the left lateral border of the stomach, one measuring about 4.5 cm diameter and the other measuring about 3.5 cm diameter, with adjoining peripancreatic fluid collection on image 17 series 8. Additional peripancreatic acute fluid collection extending caudad along the left anterior retroperitoneal margin. There is increased T2 signal in the pancreas but I do not observe a pancreatic mass. Spleen: Unremarkable Adrenals/Urinary Tract: Unremarkable Stomach/Bowel: Aside from the suspected pseudocysts noted above, no significant abnormality. Vascular/Lymphatic: Thrombosed splenic vein, new from 01/29/2015 but stable compared to 02/01/2015. The thrombosis of the splenic vein extends to the confluence of the superior mesenteric vein in the splenic vein, with a small amount of nonocclusive clot extending down into the SMV and into the portal vein confluence. Other: No supplemental non-categorized findings. Musculoskeletal: Unremarkable IMPRESSION: 1. Splenic vein thrombosis/occlusion. Thrombus in the splenic vein extends into the portal vein confluence, with small amounts of nonocclusive thrombus extending in the portal vein and proximal SMV. 2. Diffuse hepatic steatosis. 3. Pancreatitis with acute peripancreatic fluid collections and probable early pseudocyst formation along the left lateral border of the stomach. Fluid collection with somewhat irregular enhancing margins tracks around the anterior perirenal fascia all margin ; given the enhancing margins, early abscess formation is not readily excluded. I  do not see pancreatic parenchymal necrosis or an abscess involving the pancreatic parenchyma itself. Electronically  Signed   By: Gaylyn Rong M.D.   On: 02/03/2015 15:20    Medications: I have reviewed the patient's current medications.  Assessment/Plan: 1. Acute pancreatitis. Complicated by development of pancreatic pseudocyst and thrombosis of splenic vein and possible early thrombosis of portal and SMV. Patient on anticoagulation and antibiotics. She is still meeting narcotic pain medicine IV very frequently. Hopefully with anticoagulation and continued pancreatic rest her pain will slowly improve and will be able to transition to oral medications. We will check on her every day or 2.   Shanoah Asbill JR,Daric Koren L 02/05/2015, 8:11 AM  Pager: [phone] If no answer or after hours call [phone]

## 2015-02-05 NOTE — Progress Notes (Signed)
ANTICOAGULATION CONSULT NOTE - Follow-up Consult  Pharmacy Consult for Heparin, Warfarin Indication: Portal Vein Thrombosis  Allergies  Allergen Reactions  . Peanuts [Peanut Oil] Itching    Sensitivity     Patient Measurements: Height: 5\' 5"  (165.1 cm) Weight: 231 lb 3.4 oz (104.877 kg) IBW/kg (Calculated) : 57 Heparin Dosing Weight: 81 kg  Vital Signs: Temp: 98.8 F (37.1 C) (10/19 0557) Temp Source: Oral (10/19 0557) BP: 114/80 mmHg (10/19 0946) Pulse Rate: 103 (10/19 0946)  Labs:  Recent Labs  02/03/15 0525  02/04/15 0805 02/04/15 1400 02/05/15 0530 02/05/15 0550 02/05/15 0606  HGB 11.3*  --  12.5  --  11.2*  --   --   HCT 34.1*  --  38.0  --  33.9*  --   --   PLT 193  --  314  --  334  --   --   LABPROT  --   --   --   --   --   --  14.6  INR  --   --   --   --   --   --  1.13  HEPARINUNFRC  --   < > 0.40 0.28*  --  0.30  --   CREATININE 0.49  --  0.56  --  0.55  --   --   < > = values in this interval not displayed.  Estimated Creatinine Clearance: 123.7 mL/min (by C-G formula based on Cr of 0.55).  Medications:  Scheduled:  . chlorhexidine  15 mL Mouth Rinse BID  . folic acid  1 mg Oral Daily  . gabapentin  300 mg Oral QHS  . imipenem-cilastatin  500 mg Intravenous 4 times per day  . metoprolol succinate  25 mg Oral Daily  . mometasone-formoterol  2 puff Inhalation BID  . montelukast  10 mg Oral Daily  . multivitamin with minerals  1 tablet Oral Daily  . pantoprazole (PROTONIX) IV  40 mg Intravenous Q12H  . thiamine  100 mg Oral Daily   Or  . thiamine  100 mg Intravenous Daily   Infusions:  . heparin 1,650 Units/hr (02/05/15 0934)    Assessment: [age] yr female admitted 10/12 with left flank pain.  PMH includes HTN, social alcohol use. She is admitted for acute pancreatitis with sepsis and was initially started on Heparin 5000 units sq q8h for VTE prophylaxis.  MRI of abdomen (10/17) shows + portal vein thrombus and Pharmacy was consulted to dose  IV heparin for treatment of portal vein thrombus.  Pharmacy is consulted to add warfarin dosing 10/19.  Today, 02/05/2015 Heparin level 0.3, therapeutic at low end of goal range. Baseline INR 1.13 CBC: Hgb 11.2 low/stable, Plt 334 stable No bleeding reported SCr 0.55, CrCl > 100 ml/min Albumin is currently low at 2.2, but has been >3 prior to this admission  Goal of Therapy:  Heparin level 0.3-0.7 units/ml Monitor platelets by anticoagulation protocol: Yes   Plan:  Warfarin 10 mg PO once tonight at 1800. Continue heparin IV infusion at 1650 units/hr Heparin level 6 hours after rate change Daily heparin level, INR, and CBC Continue to monitor H&H and platelets   Lynann Beaverhristine Shanitra Phillippi PharmD, BCPS Pager [phone] 02/05/2015 2:11 PM

## 2015-02-05 NOTE — Progress Notes (Signed)
ANTICOAGULATION CONSULT NOTE - Follow-up Consult  Pharmacy Consult for Heparin Indication: Portal Vein Thrombosis  Allergies  Allergen Reactions  . Peanuts [Peanut Oil] Itching    Sensitivity     Patient Measurements: Height: 5\' 5"  (165.1 cm) Weight: 231 lb 3.4 oz (104.877 kg) IBW/kg (Calculated) : 57 Heparin Dosing Weight: 81 kg  Vital Signs: Temp: 98.8 F (37.1 C) (10/19 0557) Temp Source: Oral (10/19 0557) BP: 121/88 mmHg (10/19 0557) Pulse Rate: 103 (10/19 0557)  Labs:  Recent Labs  02/03/15 0525  02/04/15 0805 02/04/15 1400 02/05/15 0530 02/05/15 0550  HGB 11.3*  --  12.5  --  11.2*  --   HCT 34.1*  --  38.0  --  33.9*  --   PLT 193  --  314  --  334  --   HEPARINUNFRC  --   < > 0.40 0.28*  --  0.30  CREATININE 0.49  --  0.56  --  0.55  --   < > = values in this interval not displayed.  Estimated Creatinine Clearance: 123.7 mL/min (by C-G formula based on Cr of 0.55).   Medical History: Past Medical History  Diagnosis Date  . Asthma   . GERD (gastroesophageal reflux disease)   . Alcohol abuse   . Environmental allergies   . Headache   . Hypertension   . Syncope and collapse   . Vision abnormalities   . Prolonged QT syndrome     Medications:  Scheduled:  . chlorhexidine  15 mL Mouth Rinse BID  . folic acid  1 mg Oral Daily  . gabapentin  300 mg Oral QHS  . imipenem-cilastatin  500 mg Intravenous 4 times per day  . metoprolol succinate  25 mg Oral Daily  . mometasone-formoterol  2 puff Inhalation BID  . montelukast  10 mg Oral Daily  . multivitamin with minerals  1 tablet Oral Daily  . pantoprazole (PROTONIX) IV  40 mg Intravenous Q12H  . thiamine  100 mg Oral Daily   Or  . thiamine  100 mg Intravenous Daily   Infusions:  . heparin 1,500 Units/hr (02/05/15 0257)    Assessment:  29 yr female with left flank pain.  H/O HTN, social alcohol use. Was admitted for acute pancreatitis with sepsis.  Has been on Heparin 5000 units sq q8h for  VTE prophylaxis  MRI of abdomen (10/17) shows + portal vein thrombus  Pharmacy consulted to dose IV heparin for treatment of portal vein thrombus  Today, 02/05/2015 CBC: Hgb 11.2 low stable, Plt 334 stable No bleeding reported SrCr 0.55, CrCl 123.7 Hep lvl was 0.4 > 0.28 > 0.3 (10/19) on Hep gtt @ 1500 units/hr   Goal of Therapy:  Heparin level 0.3-0.7 units/ml Monitor platelets by anticoagulation protocol: Yes   Plan:   Increase to Hep gtt @ 1650 units/hr  Check heparin level @ 1500  Follow CBC and heparin level daily  Charolette ChildHammer, Ethan, PharmD Candidate 02/05/2015,8:03 AM

## 2015-02-06 LAB — CARDIOLIPIN ANTIBODIES, IGG, IGM, IGA: Anticardiolipin IgG: <9 GPL U/mL (ref 0–14)

## 2015-02-06 LAB — COMPREHENSIVE METABOLIC PANEL
ALBUMIN: 2.3 g/dL — AB (ref 3.5–5.0)
ALK PHOS: 210 U/L — AB (ref 38–126)
ALT: 35 U/L (ref 14–54)
ANION GAP: 8 (ref 5–15)
AST: 70 U/L — ABNORMAL HIGH (ref 15–41)
BILIRUBIN TOTAL: 1.5 mg/dL — AB (ref 0.3–1.2)
BUN: <5 mg/dL — ABNORMAL LOW (ref 6–20)
CALCIUM: 8.3 mg/dL — AB (ref 8.9–10.3)
CO2: 25 mmol/L (ref 22–32)
Chloride: 104 mmol/L (ref 101–111)
Creatinine, Ser: 0.51 mg/dL (ref 0.44–1.00)
GFR calc non Af Amer: >60 mL/min (ref >60)
GLUCOSE: 104 mg/dL — AB (ref 65–99)
POTASSIUM: 3.2 mmol/L — AB (ref 3.5–5.1)
SODIUM: 137 mmol/L (ref 135–145)
TOTAL PROTEIN: 5.7 g/dL — AB (ref 6.5–8.1)

## 2015-02-06 LAB — CBC
HCT: 34.4 % — ABNORMAL LOW (ref 36.0–46.0)
Hemoglobin: 11.5 g/dL — ABNORMAL LOW (ref 12.0–15.0)
MCH: 33.1 pg (ref 26.0–34.0)
MCHC: 33.4 g/dL (ref 30.0–36.0)
MCV: 99.1 fL (ref 78.0–100.0)
PLATELETS: 344 10*3/uL (ref 150–400)
RBC: 3.47 MIL/uL — ABNORMAL LOW (ref 3.87–5.11)
RDW: 14.6 % (ref 11.5–15.5)
WBC: 16.5 10*3/uL — AB (ref 4.0–10.5)

## 2015-02-06 LAB — PROTIME-INR
INR: 1.26 (ref 0.00–1.49)
Prothrombin Time: 16 seconds — ABNORMAL HIGH (ref 11.6–15.2)

## 2015-02-06 LAB — HEPARIN LEVEL (UNFRACTIONATED)
HEPARIN UNFRACTIONATED: 0.32 [IU]/mL (ref 0.30–0.70)
Heparin Unfractionated: 0.13 IU/mL — ABNORMAL LOW (ref 0.30–0.70)

## 2015-02-06 MED ORDER — WARFARIN SODIUM 10 MG PO TABS
10.0000 mg | ORAL_TABLET | Freq: Once | ORAL | Status: AC
  Administered 2015-02-06: 10 mg via ORAL
  Filled 2015-02-06: qty 1

## 2015-02-06 MED ORDER — ENOXAPARIN SODIUM 100 MG/ML ~~LOC~~ SOLN
100.0000 mg | Freq: Two times a day (BID) | SUBCUTANEOUS | Status: DC
  Administered 2015-02-06 – 2015-02-09 (×6): 100 mg via SUBCUTANEOUS
  Filled 2015-02-06 (×8): qty 1

## 2015-02-06 MED ORDER — OXYCODONE HCL 5 MG PO TABS
5.0000 mg | ORAL_TABLET | Freq: Once | ORAL | Status: AC
  Administered 2015-02-06: 5 mg via ORAL
  Filled 2015-02-06: qty 1

## 2015-02-06 NOTE — Progress Notes (Signed)
ANTICOAGULATION CONSULT NOTE - Follow-up Consult  Pharmacy Consult for Heparin, Warfarin Indication: Portal Vein Thrombosis  Allergies  Allergen Reactions  . Peanuts [Peanut Oil] Itching    Sensitivity     Patient Measurements: Height:  (165.1 cm) Weight: 231 lb 3.4 oz (104.877 kg) IBW/kg (Calculated) : 57 Heparin Dosing Weight: 81 kg  Vital Signs: Temp: 98.6 F (37 C) (10/20 1000) Temp Source: Oral (10/20 1000) BP: 122/87 mmHg (10/20 1000) Pulse Rate: 99 (10/20 1000)  Labs:  Recent Labs  02/04/15 0805  02/05/15 0530  02/05/15 0606 02/05/15 1610 02/06/15 0510 02/06/15 0825  HGB 12.5  --  11.2*  --   --   --  11.5*  --   HCT 38.0  --  33.9*  --   --   --  34.4*  --   PLT 314  --  334  --   --   --  344  --   LABPROT  --   --   --   --  14.6  --  16.0*  --   INR  --   --   --   --  1.13  --  1.26  --   HEPARINUNFRC 0.40  < >  --   < >  --  0.36 0.13* 0.32  CREATININE 0.56  --  0.55  --   --   --  0.51  --   < > = values in this interval not displayed.  Estimated Creatinine Clearance: 123.7 mL/min (by C-G formula based on Cr of 0.51).  Medications:  Scheduled:  . chlorhexidine  15 mL Mouth Rinse BID  . folic acid  1 mg Oral Daily  . gabapentin  300 mg Oral QHS  . imipenem-cilastatin  500 mg Intravenous 4 times per day  . metoprolol succinate  25 mg Oral Daily  . mometasone-formoterol  2 puff Inhalation BID  . montelukast  10 mg Oral Daily  . multivitamin with minerals  1 tablet Oral Daily  . pantoprazole (PROTONIX) IV  40 mg Intravenous Q12H  . thiamine  100 mg Oral Daily   Or  . thiamine  100 mg Intravenous Daily  . warfarin   Does not apply Once  . Warfarin - Pharmacist Dosing Inpatient   Does not apply q1800   Infusions:  . heparin 1,650 Units/hr (02/06/15 0604)    Assessment: 29 yr female admitted 10/12 with left flank pain.  PMH includes HTN, social alcohol use. She is admitted for acute pancreatitis with sepsis and was initially started on  Heparin 5000 units sq q8h for VTE prophylaxis.  MRI of abdomen (10/17) shows + portal vein thrombus and Pharmacy was consulted to dose IV heparin for treatment of portal vein thrombus.  Pharmacy is consulted to add warfarin dosing 10/19.  Today, 02/06/2015 Heparin level 0.32, therapeutic at low end of goal range. Had level of 0.13 (10/20), no reports of problem w/ infusion. Got a repeat lab that was in goal range. Baseline INR 1.13, After warfarin  INR 1.26 CBC: Hgb 11.5 low/stable, Plt 344 stable No bleeding reported SCr 0.51, CrCl > 100 ml/min Albumin is currently low at 2.3, but has been >3 prior to this admission  Goal of Therapy:  Heparin level 0.3-0.7 units/ml Monitor platelets by anticoagulation protocol: Yes   Plan:  Warfarin  PO once tonight at 1800 Continue heparin IV infusion at 1650 units/hr Heparin level at 2000 (10/20) Daily heparin level, INR, and CBC Continue to monitor H&H  and platelets   Charolette ChildEthan Sharday Michl, PharmD Candidate 02/06/2015 10:43 AM

## 2015-02-06 NOTE — Progress Notes (Signed)
ANTICOAGULATION CONSULT NOTE - Follow-up Consult  Pharmacy Consult for Lovenox, Warfarin Indication: Portal Vein Thrombosis  Allergies  Allergen Reactions  . Peanuts [Peanut Oil] Itching    Sensitivity     Patient Measurements: Height: 5\' 5"  (165.1 cm) Weight: 231 lb 3.4 oz (104.877 kg) IBW/kg (Calculated) : 57 Heparin Dosing Weight: 81 kg  Vital Signs: Temp: 98.1 F (36.7 C) (10/20 1153) Temp Source: Oral (10/20 1153) BP: 140/53 mmHg (10/20 1153) Pulse Rate: 72 (10/20 1153)  Labs:  Recent Labs  02/04/15 0805  02/05/15 0530  02/05/15 0606 02/05/15 1610 02/06/15 0510 02/06/15 0825  HGB 12.5  --  11.2*  --   --   --  11.5*  --   HCT 38.0  --  33.9*  --   --   --  34.4*  --   PLT 314  --  334  --   --   --  344  --   LABPROT  --   --   --   --  14.6  --  16.0*  --   INR  --   --   --   --  1.13  --  1.26  --   HEPARINUNFRC 0.40  < >  --   < >  --  0.36 0.13* 0.32  CREATININE 0.56  --  0.55  --   --   --  0.51  --   < > = values in this interval not displayed.  Estimated Creatinine Clearance: 123.7 mL/min (by C-G formula based on Cr of 0.51).  Assessment: [age] yr female admitted 10/12 with left flank pain.  PMH includes HTN, social alcohol use. She is admitted for acute pancreatitis with sepsis and was initially started on Heparin 5000 units sq q8h for VTE prophylaxis.  MRI of abdomen (10/17) shows + portal vein thrombus.  Pharmacy was initially consulted to dose IV heparin, and added warfarin dosing on 10/19.  Today, IV heparin is changed to Lovenox for bridge to warfarin therapy.  Today, 02/06/2015 Heparin level 0.32, therapeutic INR 1.26 CBC: Hgb 11.5 low/stable, Plt remains WNL No bleeding reported, no IV complications  SCr 0.51, CrCl > 100 ml/min Albumin is currently low at 2.3, but has been >3 prior to this admission  Goal of Therapy:  INR 2-3 Anti-Xa level 0.6-1 units/ml 4hrs after LMWH dose given Monitor platelets by anticoagulation protocol: Yes    Plan:  Warfarin 10mg  PO once tonight at 1800 Discontinue Heparin at 1900 Lovenox 100mg  SQ q12h, first dose at 2000 (one hour after heparin d/c) Daily INR, and CBC Continue to monitor H&H and platelets Patient will need a minimum of 5 days warfarin and Heparin/Lovenox overlap and until INR > 2 for 24 hours.   Lynann Beaverhristine Shenetta Schnackenberg PharmD, BCPS Pager [phone] 02/06/2015 1:21 PM

## 2015-02-06 NOTE — Progress Notes (Signed)
[facility] PROGRESS NOTE Subjective patient states that she feels much better today. The pain in the left upper quadrant is subsiding. She is tolerating a diet.  Objective: Vital signs in last 24 hours: Temp:  [98.6 F (37 C)-99.4 F (37.4 C)] 98.6 F (37 C) (10/20 0510) Pulse Rate:  [92-103] 92 (10/20 0510) Resp:  [18-20] 20 (10/20 0510) BP: (114-126)/(76-86) 116/80 mmHg (10/20 0510) SpO2:  [98 %-99 %] 98 % (10/20 0510) Last BM Date:  (1018)  Intake/Output from previous day: 10/19 0701 - 10/20 0700 In: 1847.5 [P.O.:330; I.V.:917.5; IV Piggyback:400] Out: -  Intake/Output this shift: Total I/O In: 60 [P.O.:60] Out: -   PE: General-- alert watching TV  Abdomen-- nondistended still some upper pain and tenderness bowel sounds present  Lab Results:  Recent Labs  02/04/15 0805 02/05/15 0530 02/06/15 0510  WBC 12.6* 14.8* 16.5*  HGB 12.5 11.2* 11.5*  HCT 38.0 33.9* 34.4*  PLT 314 334 344   BMET  Recent Labs  02/04/15 0805 02/05/15 0530 02/06/15 0510  NA 137 137 137  K 2.9* 3.4* 3.2*  CL 104 104 104  CO2 26 26 25   CREATININE 0.56 0.55 0.51   LFT  Recent Labs  02/04/15 0805 02/05/15 0530 02/06/15 0510  PROT 6.2* 5.5* 5.7*  AST 121* 86* 70*  ALT 54 43 35  ALKPHOS 189* 200* 210*  BILITOT 2.7* 1.9* 1.5*   PT/INR  Recent Labs  02/05/15 0606 02/06/15 0510  LABPROT 14.6 16.0*  INR 1.13 1.26   PANCREAS No results for input(s): LIPASE in the last 72 hours.       Studies/Results: No results found.  Medications: I have reviewed the patient's current medications.  Assessment/Plan: 1. Acute Pancreatitis. Complicated by pseudocyst formation, thrombosis of splenic vein and questionable SMV. She has been on antibiotics for fever that could've been related to the pancreatitis. Hopefully we can continue clear liquids with its fat free supplements with slow advancement to a fat-free diet and changed PO medications. She could then be  discharged and follow-up with Dr. Dorena CookeyJohn Hayes her gastroenterologist within a couple weeks to arrange another CT scan to see if the pseudocyst are resolving. She will need to be on anticoagulation for several months.   Aleesa Sweigert JR,Adriahna Shearman L 02/06/2015, 8:54 AM  Pager: [phone] If no answer or after hours call [phone]

## 2015-02-06 NOTE — Progress Notes (Signed)
[facility] PROGRESS NOTE  Kara [name] ZOX:[id number] DOB: 1985/08/25 DOA: 01/29/2015 PCP:  Duane Lope, MD  HPI/Brief narrative 29yo female past medical history of hypertension social drinker which usually drinks over the weekend hard liquor regularly that comes in she started having left flank pain 24 hours prior to admission. Patient was found to have acute pancreatitis and was subsequently admitted for further work up.  Assessment/Plan: 1. Severe Alcoholic pancreatitis  -complicated by Pseudocysts and Splenic vein thrombosis -slowly improving now -advance to Full liquids -also on IV imipenem for fevers, change to PO Cipro/FLagyl tomorrow if WBC stable -d/w GI regarding Abx yesterday  2. Acute alcoholic hepatitis -improving, counseled -LFTs improving  3. Fever -related to pancreatitis, improving -currently on Imipenem, Tmax was 101 on 10/17  4. Asthma -Remains stable. No wheezing  5. HTN -BP stable  6. ETOH abuse       -no withdrawals noted, Thiamine, counseled  7. Splenic Vein Thrombosis 1. ? PVT on recent CT abd and FU MRA on 10/17 was notable for splenic vein thrombus extending into portal vein and proximal SMV -currently on heparin gtt -anticoagulation is controversial in this setting -d/w GI Dr.Edwards, recommends PO anticoagulation for few months and re-assess due to risk of acute portal vein thrombosis -discussed risks and benefits with patient, started Warfarin 10/19  Code Status: Full Family Communication: Pt in room Disposition Plan: home in ?2days   Consultants:  Gastroenterology  Procedures:    Antibiotics: Anti-infectives    Start     Dose/Rate Route Frequency Ordered Stop   01/30/15 0000  imipenem-cilastatin (PRIMAXIN) 500 mg in sodium chloride 0.9 % 100 mL IVPB     500 mg 200 mL/hr over 30 Minutes Intravenous 4 times per day 01/29/15 1633     01/29/15 1645  imipenem-cilastatin (PRIMAXIN) 500 mg in sodium chloride 0.9 % 100 mL IVPB      500 mg 200 mL/hr over 30 Minutes Intravenous STAT 01/29/15 1633 01/29/15 1748      HPI/Subjective: Pain better, tolerating clears, no vomiting, Bm 2days ago  Objective: Filed Vitals:   02/05/15 2102 02/06/15 0002 02/06/15 0510 02/06/15 1000  BP: 118/80 126/86 116/80 122/87  Pulse: 100 95 92 99  Temp: 99.4 F (37.4 C)  98.6 F (37 C) 98.6 F (37 C)  TempSrc: Oral  Oral Oral  Resp: Height:      Weight:      SpO2: 99% 98% 98% 99%    Intake/Output Summary (Last 24 hours) at 02/06/15 1130 Last data filed at 02/06/15 0831  Gross per 24 hour  Intake 1907.49 ml  Output      0 ml  Net 1907.49 ml   Filed Weights   02/02/15 0509 02/03/15 0500 02/05/15 0557  Weight: 105.3 kg (232 lb 2.3 oz) 105.9 kg (233 lb 7.5 oz) 104.877 kg (231 lb 3.4 oz)    Exam:   General:  Asleep, easily arousable, laying in bed, in nad  Cardiovascular: regular, s1, s2  Respiratory: normal resp effort, no wheezing  Abdomen: soft,nondistended, decreased BS, non tender  Musculoskeletal: perfused, no cyanosis, no clubbing  Data Reviewed: Basic Metabolic Panel:  Recent Labs Lab 02/02/15 0510 02/03/15 0525 02/04/15 0805 02/05/15 0530 02/06/15 0510  NA 133* 136 137 137 137  K 3.5 2.8* 2.9* 3.4* 3.2*  CL 101 101 104 104 104  CO2 GLUCOSE 97 115* 111* 115* 104*  BUN 6 <5* <5* <5* <5*  CREATININE  0.70 0.49 0.56 0.55 0.51  CALCIUM 8.0* 8.0* 8.2* 8.2* 8.3*  MG 1.6* 1.8 2.0  --   --    Liver Function Tests:  Recent Labs Lab 02/02/15 0510 02/03/15 0525 02/04/15 0805 02/05/15 0530 02/06/15 0510  AST 101* 123* 121* 86* 70*  ALT 46 48 54 43 35  ALKPHOS 119 145* 189* 200* 210*  BILITOT 3.4* 2.7* 2.7* 1.9* 1.5*  PROT 5.5* 5.4* 6.2* 5.5* 5.7*  ALBUMIN 2.2* 2.2* 2.5* 2.2* 2.3*    Recent Labs Lab 01/31/15 0510 02/03/15 0525  LIPASE 97* 60*   No results for input(s): AMMONIA in the last 168 hours. CBC:  Recent Labs Lab 02/02/15 0510 02/03/15 0525  02/04/15 0805 02/05/15 0530 02/06/15 0510  WBC 11.6* 11.7* 12.6* 14.8* 16.5*  NEUTROABS  --  6.6  --   --   --   HGB 11.9* 11.3* 12.5 11.2* 11.5*  HCT 36.6 34.1* 38.0 33.9* 34.4*  MCV 98.9 96.9 98.4 98.5 99.1  PLT 155 193 314 334 344   Cardiac Enzymes: No results for input(s): CKTOTAL, CKMB, CKMBINDEX, TROPONINI in the last 168 hours. BNP (last 3 results)  Recent Labs  07/06/14 1255  BNP 79.2    ProBNP (last 3 results) No results for input(s): PROBNP in the last 8760 hours.  CBG: No results for input(s): GLUCAP in the last 168 hours.  Recent Results (from the past 240 hour(s))  Culture, blood (routine x 2)     Status: None   Collection Time: 01/31/15 12:17 PM  Result Value Ref Range Status   Specimen Description BLOOD RIGHT ANTECUBITAL  Final   Special Requests BOTTLES DRAWN AEROBIC AND ANAEROBIC 10CC  Final   Culture   Final    NO GROWTH 5 DAYS Performed at [facility]    Report Status 02/05/2015 FINAL  Final  Culture, blood (routine x 2)     Status: None   Collection Time: 01/31/15 12:17 PM  Result Value Ref Range Status   Specimen Description BLOOD RIGHT HAND  Final   Special Requests BOTTLES DRAWN AEROBIC AND ANAEROBIC 5CC  Final   Culture   Final    NO GROWTH 5 DAYS Performed at [facility]    Report Status 02/05/2015 FINAL  Final  Culture, Urine     Status: None   Collection Time: 02/03/15  3:47 AM  Result Value Ref Range Status   Specimen Description URINE, CLEAN CATCH  Final   Special Requests NONE  Final   Culture   Final    NO GROWTH 1 DAY Performed at [facility]    Report Status 02/04/2015 FINAL  Final     Studies: No results found.  Scheduled Meds: . chlorhexidine  15 mL Mouth Rinse BID  . folic acid  1 mg Oral Daily  . gabapentin  300 mg Oral QHS  . imipenem-cilastatin  500 mg Intravenous 4 times per day  . metoprolol succinate  25 mg Oral Daily  . mometasone-formoterol  2 puff Inhalation BID  . montelukast   10 mg Oral Daily  . multivitamin with minerals  1 tablet Oral Daily  . pantoprazole (PROTONIX) IV  40 mg Intravenous Q12H  . thiamine  100 mg Oral Daily   Or  . thiamine  100 mg Intravenous Daily  . warfarin   Does not apply Once  . Warfarin - Pharmacist Dosing Inpatient   Does not apply q1800   Continuous Infusions: . heparin 1,650 Units/hr (02/06/15 0604)  Principal Problem:   Acute pancreatitis Active Problems:   Alcohol abuse   Elevated LFTs   Sinus tachycardia (HCC)   Leukocytosis   AKI (acute kidney injury) (HCC)   Splenic vein thrombosis   [place] GastroenterologyJOSEPH,Kaliyah Gladman  [facility] Pager [phone]. If 7PM-7AM, please contact night-coverage at [email or web address], password [email or web address] 02/06/2015, 11:30 AM  LOS: 8 days

## 2015-02-07 LAB — PROTEIN S ACTIVITY: PROTEIN S ACTIVITY: 135 % (ref 60–145)

## 2015-02-07 LAB — COMPREHENSIVE METABOLIC PANEL
ALK PHOS: 215 U/L — AB (ref 38–126)
ALT: 33 U/L (ref 14–54)
AST: 68 U/L — AB (ref 15–41)
Albumin: 2.3 g/dL — ABNORMAL LOW (ref 3.5–5.0)
Anion gap: 8 (ref 5–15)
BILIRUBIN TOTAL: 1.5 mg/dL — AB (ref 0.3–1.2)
CALCIUM: 8.4 mg/dL — AB (ref 8.9–10.3)
CO2: 27 mmol/L (ref 22–32)
CREATININE: 0.53 mg/dL (ref 0.44–1.00)
Chloride: 104 mmol/L (ref 101–111)
GFR calc Af Amer: >60 mL/min (ref >60)
GLUCOSE: 116 mg/dL — AB (ref 65–99)
POTASSIUM: 3.2 mmol/L — AB (ref 3.5–5.1)
Sodium: 139 mmol/L (ref 135–145)
TOTAL PROTEIN: 6 g/dL — AB (ref 6.5–8.1)

## 2015-02-07 LAB — PROTEIN S, TOTAL: PROTEIN S AG TOTAL: 132 % (ref 58–150)

## 2015-02-07 LAB — CBC
HEMATOCRIT: 36 % (ref 36.0–46.0)
Hemoglobin: 11.7 g/dL — ABNORMAL LOW (ref 12.0–15.0)
MCH: 32.2 pg (ref 26.0–34.0)
MCHC: 32.5 g/dL (ref 30.0–36.0)
MCV: 99.2 fL (ref 78.0–100.0)
PLATELETS: 383 10*3/uL (ref 150–400)
RBC: 3.63 MIL/uL — ABNORMAL LOW (ref 3.87–5.11)
RDW: 14.7 % (ref 11.5–15.5)
WBC: 14.6 10*3/uL — AB (ref 4.0–10.5)

## 2015-02-07 LAB — PROTIME-INR
INR: 2.27 — AB (ref 0.00–1.49)
PROTHROMBIN TIME: 24.8 s — AB (ref 11.6–15.2)

## 2015-02-07 LAB — LUPUS ANTICOAGULANT PANEL
DRVVT: 45.3 s (ref 0.0–55.1)
PTT Lupus Anticoagulant: 46.3 s (ref 0.0–50.0)

## 2015-02-07 LAB — PROTEIN C ACTIVITY: Protein C Activity: 99 % (ref 74–151)

## 2015-02-07 LAB — HEPARIN LEVEL (UNFRACTIONATED): HEPARIN UNFRACTIONATED: 0.25 [IU]/mL — AB (ref 0.30–0.70)

## 2015-02-07 MED ORDER — CEFUROXIME AXETIL 500 MG PO TABS
500.0000 mg | ORAL_TABLET | Freq: Two times a day (BID) | ORAL | Status: DC
  Administered 2015-02-07 – 2015-02-09 (×5): 500 mg via ORAL
  Filled 2015-02-07 (×7): qty 1

## 2015-02-07 MED ORDER — METRONIDAZOLE 500 MG PO TABS
500.0000 mg | ORAL_TABLET | Freq: Three times a day (TID) | ORAL | Status: DC
  Administered 2015-02-07 – 2015-02-09 (×6): 500 mg via ORAL
  Filled 2015-02-07 (×9): qty 1

## 2015-02-07 MED ORDER — PANTOPRAZOLE SODIUM 40 MG PO TBEC
40.0000 mg | DELAYED_RELEASE_TABLET | Freq: Two times a day (BID) | ORAL | Status: DC
  Administered 2015-02-07 – 2015-02-09 (×5): 40 mg via ORAL
  Filled 2015-02-07 (×7): qty 1

## 2015-02-07 MED ORDER — WARFARIN SODIUM 2.5 MG PO TABS
2.5000 mg | ORAL_TABLET | Freq: Once | ORAL | Status: AC
  Administered 2015-02-07: 2.5 mg via ORAL
  Filled 2015-02-07: qty 1

## 2015-02-07 MED ORDER — POTASSIUM CHLORIDE CRYS ER 20 MEQ PO TBCR
40.0000 meq | EXTENDED_RELEASE_TABLET | Freq: Two times a day (BID) | ORAL | Status: DC
  Administered 2015-02-07 – 2015-02-09 (×5): 40 meq via ORAL
  Filled 2015-02-07 (×7): qty 2

## 2015-02-07 NOTE — Progress Notes (Signed)
[facility] PROGRESS NOTE  Kara [name] UJW:[id number] DOB: 1985-05-25 DOA: 01/29/2015 PCP:  Duane Lope, MD  HPI/Brief narrative 29yo female past medical history of hypertension social drinker which usually drinks over the weekend hard liquor regularly that comes in she started having left flank pain 24 hours prior to admission. Patient was found to have acute pancreatitis and was subsequently admitted for further work up.  Assessment/Plan: 1. Severe Alcoholic pancreatitis  -complicated by Pseudocysts and Splenic vein thrombosis -improving -tolerating Full liquids -also on IV imipenem for fevers associated with Pancreatitis, Stop Imipenem, improved now, change to PO flagyl and Cefuroxime, will not add CIpro due to Warfarin use, monitor on PO Abx for 24-48h  2. Acute alcoholic hepatitis -improving, counseled -LFTs improving  3. Fever -related to pancreatitis, improved -currently on Imipenem, change to Po Abx, Tmax was 101 on 10/17  4. Asthma -Remains stable. No wheezing  5. HTN -BP stable  6. ETOH abuse       -no withdrawals noted, Thiamine, counseled  7. Splenic Vein Thrombosis 1. ? PVT on recent CT abd and FU MRA on 10/17 was notable for splenic vein thrombus extending into portal vein and proximal SMV -was on heparin gtt -anticoagulation is controversial in this setting -d/w GI Dr.Edwards, recommends PO anticoagulation for few months and re-assess due to risk of acute portal vein thrombosis -discussed risks and benefits with patient, started Warfarin 10/19, being bridged with lovenox  Code Status: Full Family Communication: Pt in room Disposition Plan: home 0 mg 200 mL/hr over 30 Minutes Intravenous STAT 01/29/15 1633 01/29/15 1748      HPI/Subjective: Pain better, tolerating full liq, no vomiting, Bm today  Objective: Filed Vitals:   02/06/15 2131 02/07/15 0515 02/07/15 0525 02/07/15 0816  BP: 142/92 121/82    Pulse: 98 95    Temp: 98.7 F (37.1 C) 98.5 F (36.9 C)    TempSrc: Oral Oral    Resp: 20 20    Height:      Weight:   107 kg (235 lb 14.3 oz)   SpO2: 98% 96%  93%    Intake/Output Summary (Last 24 hours) at 02/07/15 1004 Last data filed at 02/07/15 0507  Gross per 24 hour  Intake    200 ml  Output      0 ml  Net    200 ml   Filed Weights   02/03/15 0500 02/05/15 0557 02/07/15 0525  Weight: 105.9 kg (233 lb 7.5 oz) 104.877 kg (231 lb 3.4 oz) 107 kg (235 lb 14.3 oz)    Exam:   General:  AAox3, no distress  Cardiovascular: regular, s1, s2  Respiratory: normal resp effort, no wheezing  Abdomen:  soft,nondistended, decreased BS, non tender  Musculoskeletal: perfused, no cyanosis, no clubbing  Data Reviewed: Basic Metabolic Panel:  Recent Labs Lab 02/02/15 0510 02/03/15 0525 02/04/15 0805 02/05/15 0530 02/06/15 0510 02/07/15 0540  NA 133* 136 137 137 137 139  K 3.5 2.8* 2.9* 3.4* 3.2* 3.2*  CL 101 101 104 104 104 104  CO2 24 25 26 26 25 27   GLUCOSE 97 115* 111* 115* 104* 116*  BUN 6 <5* <5* <5* <5* <5*  CREATININE 0.70 0.49 0.56 0.55 0.51 0.53  CALCIUM 8.0* 8.0* 8.2* 8.2* 8.3* 8.4*  MG 1.6* 1.8 2.0  --   --   --    Liver Function Tests:  Recent Labs Lab 02/03/15 0525 02/04/15 0805 02/05/15 0530 02/06/15 0510 02/07/15 0540  AST 123* 121*  86* 70* 68*  ALT 48 54 43 35 33  ALKPHOS 145* 189* 200* 210* 215*  BILITOT 2.7* 2.7* 1.9* 1.5* 1.5*  PROT 5.4* 6.2* 5.5* 5.7* 6.0*  ALBUMIN 2.2* 2.5* 2.2* 2.3* 2.3*    Recent Labs Lab 02/03/15 0525  LIPASE 60*   No results for input(s): AMMONIA in the last 168 hours. CBC:  Recent Labs Lab 02/03/15 0525 02/04/15 0805 02/05/15 0530 02/06/15 0510 02/07/15 0540  WBC 11.7* 12.6* 14.8* 16.5* 14.6*  NEUTROABS 6.6  --   --   --   --   HGB 11.3* 12.5 11.2* 11.5* 11.7*  HCT 34.1* 38.0 33.9* 34.4* 36.0  MCV 96.9 98.4 98.5 99.1 99.2  PLT 193 314 334 344 383   Cardiac Enzymes: No results for input(s): CKTOTAL, CKMB, CKMBINDEX, TROPONINI in the last 168 hours. BNP (last 3 results)  Recent Labs  07/06/14 1255  BNP 79.2    ProBNP (last 3 results) No results for input(s): PROBNP in the last 8760 hours.  CBG: No results for input(s): GLUCAP in the last 168 hours.  Recent Results (from the past 240 hour(s))  Culture, blood (routine x 2)     Status: None   Collection Time: 01/31/15 12:17 PM  Result Value Ref Range Status   Specimen Description BLOOD RIGHT ANTECUBITAL  Final   Special Requests BOTTLES DRAWN AEROBIC AND ANAEROBIC 10CC  Final   Culture   Final    NO GROWTH 5 DAYS Performed at [facility]    Report Status 02/05/2015 FINAL  Final  Culture, blood (routine x 2)     Status: None   Collection Time: 01/31/15 12:17 PM  Result Value Ref Range Status   Specimen Description BLOOD RIGHT HAND  Final   Special Requests BOTTLES DRAWN AEROBIC AND ANAEROBIC 5CC  Final   Culture   Final    NO GROWTH 5 DAYS Performed at [facility]    Report Status 02/05/2015 FINAL  Final  Culture, Urine     Status: None   Collection Time: 02/03/15  3:47 AM  Result Value Ref Range Status   Specimen Description URINE, CLEAN CATCH  Final   Special Requests NONE  Final   Culture   Final    NO GROWTH 1 DAY Performed at [facility]    Report Status 02/04/2015  FINAL  Final     Studies: No results found.  Scheduled Meds: . cefUROXime  500 mg Oral BID WC  . chlorhexidine  15 mL Mouth Rinse BID  . enoxaparin (LOVENOX) injection  100 mg Subcutaneous Q12H  . folic acid  1 mg Oral Daily  . gabapentin  300 mg Oral QHS  . metoprolol  succinate  25 mg Oral Daily  . metroNIDAZOLE  500 mg Oral 3 times per day  . mometasone-formoterol  2 puff Inhalation BID  . montelukast  10 mg Oral Daily  . multivitamin with minerals  1 tablet Oral Daily  . pantoprazole (PROTONIX) IV  40 mg Intravenous Q12H  . potassium chloride  40 mEq Oral BID  . thiamine  100 mg Oral Daily   Or  . thiamine  100 mg Intravenous Daily  . warfarin   Does not apply Once  . Warfarin - Pharmacist Dosing Inpatient   Does not apply q1800   Continuous Infusions:    Principal Problem:   Acute pancreatitis Active Problems:   Alcohol abuse   Elevated LFTs   Sinus tachycardia (HCC)   Leukocytosis   AKI (acute kidney injury) (HCC)   Splenic vein thrombosis   [facility]  [facility] Pager [phone]. If 7PM-7AM, please contact night-coverage at [email or web address], password [facility] 02/07/2015, 10:04 AM  LOS: 9 days

## 2015-02-07 NOTE — Progress Notes (Signed)
[facility] PROGRESS NOTE Subjective Pt with less pain, tolerating diet. On FLs  Objective: Vital signs in last 24 hours: Temp:  [98.1 F (36.7 C)-98.7 F (37.1 C)] 98.5 F (36.9 C) (10/21 0515) Pulse Rate:  [72-99] 95 (10/21 0515) Resp:  [18-20] 20 (10/21 0515) BP: (119-142)/(53-92) 121/82 mmHg (10/21 0515) SpO2:  [93 %-99 %] 93 % (10/21 0816) Weight:  [107 kg (235 lb 14.3 oz)] 107 kg (235 lb 14.3 oz) (10/21 0525) Last BM Date: 02/06/15  Intake/Output from previous day: 10/20 0701 - 10/21 0700 In: 260 [P.O.:60; IV Piggyback:200] Out: -  Intake/Output this shift:    PE:  Abdomen--soft minimal tenderness  Lab Results:  Recent Labs  02/05/15 0530 02/06/15 0510 02/07/15 0540  WBC 14.8* 16.5* 14.6*  HGB 11.2* 11.5* 11.7*  HCT 33.9* 34.4* 36.0  PLT 334 344 383   BMET  Recent Labs  02/05/15 0530 02/06/15 0510 02/07/15 0540  NA 137 137 139  K 3.4* 3.2* 3.2*  CL 104 104 104  CO2 26 25 27   CREATININE 0.55 0.51 0.53   LFT  Recent Labs  02/05/15 0530 02/06/15 0510 02/07/15 0540  PROT 5.5* 5.7* 6.0*  AST 86* 70* 68*  ALT 43 35 33  ALKPHOS 200* 210* 215*  BILITOT 1.9* 1.5* 1.5*   PT/INR  Recent Labs  02/05/15 0606 02/06/15 0510 02/07/15 0540  LABPROT 14.6 16.0* 24.8*  INR 1.13 1.26 2.27*   PANCREAS No results for input(s): LIPASE in the last 72 hours.       Studies/Results: No results found.  Medications: I have reviewed the patient's current medications.  Assessment/Plan: 1. Acute Pancreatitis. Complicated by pseudocysts and thrombosis of SV ? SMV on anticoagulation. Will need close f/u by GI. Would discharge of low fat diet ? Have dietician instruct pt before discharge and f/u with Dr Madilyn FiremanHayes about 2 weeks after discharge to arrange repeat CT scan   Mathias Bogacki JR,Kathrin Folden L 02/07/2015, 8:22 AM  Pager: [phone] If no answer or after hours call [phone]

## 2015-02-07 NOTE — Progress Notes (Signed)
[id number]/Rhonda Davis,RN,BSN,CCM: intiaiting po /poor po intake with abd pain and nausea.

## 2015-02-07 NOTE — Discharge Instructions (Signed)

## 2015-02-07 NOTE — Progress Notes (Addendum)
ANTICOAGULATION CONSULT NOTE - Follow-up Consult  Pharmacy Consult for Lovenox, Warfarin Indication: Portal Vein Thrombosis  Allergies  Allergen Reactions  . Peanuts [Peanut Oil] Itching    Sensitivity     Patient Measurements: Height: 5\' 5"  (165.1 cm) Weight: 235 lb 14.3 oz (107 kg) IBW/kg (Calculated) : 57  Vital Signs: Temp: 98.5 F (36.9 C) (10/21 0515) Temp Source: Oral (10/21 0515) BP: 121/82 mmHg (10/21 0515) Pulse Rate: 95 (10/21 0515)  Labs:  Recent Labs  02/05/15 0530  02/05/15 0606  02/06/15 0510 02/06/15 0825 02/07/15 0540 02/07/15 0546  HGB 11.2*  --   --   --  11.5*  --  11.7*  --   HCT 33.9*  --   --   --  34.4*  --  36.0  --   PLT 334  --   --   --  344  --  383  --   LABPROT  --   --  14.6  --  16.0*  --  24.8*  --   INR  --   --  1.13  --  1.26  --  2.27*  --   HEPARINUNFRC  --   < >  --   < > 0.13* 0.32  --  0.25*  CREATININE 0.55  --   --   --  0.51  --  0.53  --   < > = values in this interval not displayed.  Estimated Creatinine Clearance: 125 mL/min (by C-G formula based on Cr of 0.53).  Assessment: Kara [name] admitted 10/12 with left flank pain.  PMH includes HTN, social alcohol use. She is admitted for acute pancreatitis with sepsis and was initially started on Heparin 5000 units sq q8h for VTE prophylaxis.  MRI of abdomen (10/17) shows + portal vein thrombus.  Pharmacy was initially consulted to dose IV heparin, and added warfarin dosing on 10/19.  Yesterday, IV heparin was changed to Lovenox for bridge to warfarin therapy.  Today, 02/07/2015  INR 2.27, therapeutic with large increase after 2 doses of 10mg .  CBC: Hgb 11.7 low/slowly trending up, Plt remains WNL  No bleeding reported  Albumin low at 2.3, AST/ALT slightly elevated but improving.  SCr 0.53, CrCl > 100 ml/min  Drug-Drug Interactions: Flagyl increases warfarin concentrations (warfarin dose adjusted), cefuroxime increases the effect of warfarin (monitor  closely).  Goal of Therapy:  INR 2-3 Anti-Xa level 0.6-1 units/ml 4hrs after LMWH dose given Monitor platelets by anticoagulation protocol: Yes   Plan:  Warfarin 2.5mg  PO once tonight at 1800 Continue Lovenox 100mg  SQ q12h Daily INR, and CBC Continue to monitor H&H and platelets Patient will need a minimum of 5 days warfarin and Heparin/Lovenox overlap and until INR > 2 for 24 hours.  Charolette ChildEthan Hammer, PharmD Candidate 02/07/2015 9:39 AM    I have reviewed and agree with the assessment and plan as stated. Warfarin education has been completed on 02/07/15.  Lynann Beaverhristine Haruka Kowaleski PharmD, BCPS Pager [phone] 02/07/2015 1:35 PM

## 2015-02-08 LAB — PROTIME-INR
INR: 2.75 — AB (ref 0.00–1.49)
PROTHROMBIN TIME: 28.6 s — AB (ref 11.6–15.2)

## 2015-02-08 LAB — CBC
HCT: 34.1 % — ABNORMAL LOW (ref 36.0–46.0)
HEMOGLOBIN: 11 g/dL — AB (ref 12.0–15.0)
MCH: 32.1 pg (ref 26.0–34.0)
MCHC: 32.3 g/dL (ref 30.0–36.0)
MCV: 99.4 fL (ref 78.0–100.0)
Platelets: 357 10*3/uL (ref 150–400)
RBC: 3.43 MIL/uL — ABNORMAL LOW (ref 3.87–5.11)
RDW: 14.6 % (ref 11.5–15.5)
WBC: 12.3 10*3/uL — AB (ref 4.0–10.5)

## 2015-02-08 LAB — COMPREHENSIVE METABOLIC PANEL
ALK PHOS: 217 U/L — AB (ref 38–126)
ALT: 31 U/L (ref 14–54)
AST: 74 U/L — ABNORMAL HIGH (ref 15–41)
Albumin: 2.3 g/dL — ABNORMAL LOW (ref 3.5–5.0)
Anion gap: 6 (ref 5–15)
BUN: <5 mg/dL — ABNORMAL LOW (ref 6–20)
CALCIUM: 8.4 mg/dL — AB (ref 8.9–10.3)
CO2: 28 mmol/L (ref 22–32)
CREATININE: 0.59 mg/dL (ref 0.44–1.00)
Chloride: 107 mmol/L (ref 101–111)
Glucose, Bld: 110 mg/dL — ABNORMAL HIGH (ref 65–99)
Potassium: 3.8 mmol/L (ref 3.5–5.1)
SODIUM: 141 mmol/L (ref 135–145)
Total Bilirubin: 1.3 mg/dL — ABNORMAL HIGH (ref 0.3–1.2)
Total Protein: 5.8 g/dL — ABNORMAL LOW (ref 6.5–8.1)

## 2015-02-08 MED ORDER — WARFARIN SODIUM 2.5 MG PO TABS
2.5000 mg | ORAL_TABLET | Freq: Once | ORAL | Status: AC
  Administered 2015-02-08: 2.5 mg via ORAL
  Filled 2015-02-08: qty 1

## 2015-02-08 NOTE — Progress Notes (Signed)
[facility] PROGRESS NOTE  Kara [name] NWG:[id number] DOB: 1985/05/23 DOA: 01/29/2015 PCP:  Duane Lope, MD  HPI/Brief narrative 29yo female past medical history of hypertension social drinker which usually drinks over the weekend hard liquor regularly that comes in she started having left flank pain 24 hours prior to admission. Patient was found to have acute pancreatitis and was subsequently admitted for further work up.  Assessment/Plan: 1. Severe Alcoholic pancreatitis  -complicated by Pseudocysts and Splenic vein thrombosis -improving, tolerating Full liquids -change to Low fat diet -was also on IV imipenem for fevers associated with Pancreatitis, Stopped Imipenem on 10/21 and change to PO flagyl and Cefuroxime, did not use CIpro due to Warfarin use, continue Abx for few more days  2. Acute alcoholic hepatitis -improving, counseled -LFTs improving  3. Fever -related to pancreatitis, improved -currently on Imipenem, change to Po Abx, Tmax was 101 on 10/17  4. Asthma -Remains stable. No wheezing  5. HTN -BP stable  6. ETOH abuse       -no withdrawals noted, Thiamine, counseled  7. Splenic Vein Thrombosis 1. ? PVT on recent CT abd and FU MRA on 10/17 was notable for splenic vein thrombus extending into portal vein and proximal SMV -was on heparin gtt -anticoagulation is controversial in this setting -d/w GI Dr.Edwards, recommends PO anticoagulation for few months and re-assess due to risk of acute portal vein thrombosis -discussed risks and benefits with patient, started Warfarin 10/19, being bridged with lovenox -INR therapeutic, last day of bridge tomorrow, INR check next week  Code Status: Full Family Communication: Pt in room Disposition Plan: home 0 mg 200 mL/hr over 30 Minutes Intravenous STAT 01/29/15 1633 01/29/15 1748      HPI/Subjective: Pain better, tolerating full liq, no vomiting, having Bms  Objective: Filed Vitals:   02/07/15 1948 02/07/15 2125 02/08/15 0449 02/08/15 0826  BP:  105/72 116/79   Pulse:  106 98   Temp:  99 F (37.2 C) 98.3 F (36.8 C)   TempSrc:  Oral Oral   Resp:  20 22   Height:      Weight:   104.2 kg (229 lb 11.5 oz)   SpO2: 94% 95% 96% 95%    Intake/Output Summary (Last 24 hours) at 02/08/15 1034 Last data filed at 02/08/15 0934  Gross per 24 hour  Intake    300 ml  Output    600 ml  Net   -300 ml   Filed Weights   02/05/15 0557 02/07/15 0525 02/08/15 0449  Weight: 104.877 kg (231 lb 3.4 oz) 107 kg (235 lb 14.3 oz) 104.2 kg (229 lb 11.5 oz)    Exam:   General:  AAox3, no  distress, in good spirits  Cardiovascular: regular, s1, s2  Respiratory: normal resp effort, no wheezing  Abdomen: soft,nondistended, decreased BS, non tender  Musculoskeletal: perfused, no cyanosis, no clubbing  Data Reviewed: Basic Metabolic Panel:  Recent Labs Lab 02/02/15 0510 02/03/15 0525 02/04/15 0805 02/05/15 0530 02/06/15 0510 02/07/15 0540 02/08/15 0550  NA 133* 136 137 137 137 139 141  K 3.5 2.8* 2.9* 3.4* 3.2* 3.2* 3.8  CL 101 101 104 104 104 104 107  CO2 GLUCOSE 97 115* 111* 115* 104* 116* 110*  BUN 6 <5* <5* <5* <5* <5* <5*  CREATININE 0.70 0.49 0.56 0.55 0.51 0.53 0.59  CALCIUM 8.0* 8.0* 8.2* 8.2* 8.3* 8.4* 8.4*   MG 1.6* 1.8 2.0  --   --   --   --    Liver Function Tests:  Recent Labs Lab 02/04/15 0805 02/05/15 0530 02/06/15 0510 02/07/15 0540 02/08/15 0550  AST 121* 86* 70* 68* 74*  ALT 54 43 35 33 31  ALKPHOS 189* 200* 210* 215* 217*  BILITOT 2.7* 1.9* 1.5* 1.5* 1.3*  PROT 6.2* 5.5* 5.7* 6.0* 5.8*  ALBUMIN 2.5* 2.2* 2.3* 2.3* 2.3*    Recent Labs Lab 02/03/15 0525  LIPASE 60*   No results for input(s): AMMONIA in the last 168 hours. CBC:  Recent Labs Lab 02/03/15 0525 02/04/15 0805 02/05/15 0530 02/06/15 0510 02/07/15 0540 02/08/15 0550  WBC 11.7* 12.6* 14.8* 16.5* 14.6* 12.3*  NEUTROABS 6.6  --   --   --   --   --   HGB 11.3* 12.5 11.2* 11.5* 11.7* 11.0*  HCT 34.1* 38.0 33.9* 34.4* 36.0 34.1*  MCV 96.9 98.4 98.5 99.1 99.2 99.4  PLT 193 314 334 344 383 357   Cardiac Enzymes: No results for input(s): CKTOTAL, CKMB, CKMBINDEX, TROPONINI in the last 168 hours. BNP (last 3 results)  Recent Labs  07/06/14 1255  BNP 79.2    ProBNP (last 3 results) No results for input(s): PROBNP in the last 8760 hours.  CBG: No results for input(s): GLUCAP in the last 168 hours.  Recent Results (from the past 240 hour(s))  Culture, blood (routine x 2)     Status: None   Collection Time: 01/31/15 12:17 PM  Result Value Ref Range Status   Specimen Description BLOOD RIGHT ANTECUBITAL  Final   Special Requests BOTTLES DRAWN AEROBIC AND ANAEROBIC 10CC  Final   Culture   Final    NO GROWTH 5 DAYS Performed at [facility]    Report Status 02/05/2015 FINAL  Final  Culture, blood (routine x 2)     Status: None   Collection Time: 01/31/15 12:17 PM  Result Value Ref Range Status   Specimen Description BLOOD RIGHT HAND  Final   Special Requests BOTTLES DRAWN AEROBIC AND ANAEROBIC 5CC  Final   Culture   Final    NO GROWTH 5 DAYS Performed at [facility]    Report Status 02/05/2015 FINAL  Final  Culture, Urine     Status: None   Collection Time: 02/03/15  3:47 AM   Result Value Ref Range Status   Specimen Description URINE, CLEAN CATCH  Final   Special Requests NONE  Final   Culture   Final    NO GROWTH 1 DAY Performed at [facility]    Report Status 02/04/2015 FINAL  Final     Studies: No results found.  Scheduled Meds: . cefUROXime  500 mg Oral  BID WC  . chlorhexidine  15 mL Mouth Rinse BID  . enoxaparin (LOVENOX) injection  100 mg Subcutaneous Q12H  . folic acid  1 mg Oral Daily  . gabapentin  300 mg Oral QHS  . metoprolol succinate  25 mg Oral Daily  . metroNIDAZOLE  500 mg Oral 3 times per day  . mometasone-formoterol  2 puff Inhalation BID  . montelukast  10 mg Oral Daily  . multivitamin with minerals  1 tablet Oral Daily  . pantoprazole  40 mg Oral BID  . potassium chloride  40 mEq Oral BID  . thiamine  100 mg Oral Daily  . warfarin   Does not apply Once  . Warfarin - Pharmacist Dosing Inpatient   Does not apply q1800   Continuous Infusions:    Principal Problem:   Acute pancreatitis Active Problems:   Alcohol abuse   Elevated LFTs   Sinus tachycardia (HCC)   Leukocytosis   AKI (acute kidney injury) (HCC)   Splenic vein thrombosis   [name]  [facility] Pager [phone]. If 7PM-7AM, please contact night-coverage at [email or web address], password [email or web address] 02/08/2015, 10:34 AM  LOS: 10 days

## 2015-02-08 NOTE — Progress Notes (Addendum)
ANTICOAGULATION CONSULT NOTE - Follow-up Consult  Pharmacy Consult for Lovenox, Warfarin Indication: Portal Vein Thrombosis  Allergies  Allergen Reactions  . Peanuts [Peanut Oil] Itching    Sensitivity     Patient Measurements: Height: 5\' 5"  (165.1 cm) Weight: 229 lb 11.5 oz (104.2 kg) IBW/kg (Calculated) : 57  Vital Signs: Temp: 98.3 F (36.8 C) (10/22 0449) Temp Source: Oral (10/22 0449) BP: 116/79 mmHg (10/22 0449) Pulse Rate: 98 (10/22 0449)  Labs:  Recent Labs  02/06/15 0510 02/06/15 0825 02/07/15 0540 02/07/15 0546 02/08/15 0550  HGB 11.5*  --  11.7*  --  11.0*  HCT 34.4*  --  36.0  --  34.1*  PLT 344  --  383  --  357  LABPROT 16.0*  --  24.8*  --  28.6*  INR 1.26  --  2.27*  --  2.75*  HEPARINUNFRC 0.13* 0.32  --  0.25*  --   CREATININE 0.51  --  0.53  --  0.59    Estimated Creatinine Clearance: 123.2 mL/min (by C-G formula based on Cr of 0.59).  Assessment: [age] yr female admitted 10/12 with left flank pain.  PMH includes HTN, social alcohol use. She is admitted for acute pancreatitis with sepsis and was initially started on Heparin 5000 units sq q8h for VTE prophylaxis.  MRI of abdomen (10/17) shows + portal vein thrombus.  Pharmacy was initially consulted to dose IV heparin, and added warfarin dosing on 10/19.  Yesterday, IV heparin was changed to Lovenox for bridge to warfarin therapy.  Today, 02/08/2015  INR 2.75, therapeutic with large increase after 2 doses of 10mg . Rate of rise improved overnight using a warfarin dose of 2.5mg .  Suspect half-point increase overnight still from 10mg  doses received.   CBC: Hgb 11 - decreased overnight, Plt remains WNL  No bleeding reported  Albumin low at 2.3, AST/ALT slightly elevated but improving.  SCr 0.53, CrCl > 100 ml/min  Drug-Drug Interactions: Flagyl increases warfarin concentrations (warfarin dose adjusted), cefuroxime increases the effect of warfarin (monitor closely).  Goal of Therapy:  INR  2-3 Anti-Xa level 0.6-1 units/ml 4hrs after LMWH dose given Monitor platelets by anticoagulation protocol: Yes   Plan:  Day #4 VTE overlap therapy - Needs minimum of 5 days of overlap Warfarin 2.5mg  PO once tonight at 1800 - Hard to predict what INR will do following 10mg  x 2 doses, interaction with metronidazole, and hepatitis  Should have better idea of home dosing with 10/23 INR Continue Lovenox 100mg  SQ q12h - can D/C after 10/23 doses if INR remains >2 Daily INR, and CBC Continue to monitor H&H and platelets Patient will need a minimum of 5 days warfarin and Heparin/Lovenox overlap and until INR > 2 for 24 hours.  Juliette Alcideustin Gurpreet Mariani, PharmD, BCPS.   Pager: [phone] 02/08/2015 2:57 PM

## 2015-02-09 ENCOUNTER — Emergency Department (HOSPITAL_COMMUNITY): Payer: BC Managed Care – PPO

## 2015-02-09 ENCOUNTER — Encounter (HOSPITAL_COMMUNITY): Payer: Self-pay | Admitting: *Deleted

## 2015-02-09 ENCOUNTER — Inpatient Hospital Stay (HOSPITAL_COMMUNITY)
Admission: EM | Admit: 2015-02-09 | Discharge: 2015-02-21 | DRG: 987 | Disposition: A | Discharge status: Home / Self Care | Payer: BC Managed Care – PPO | Attending: Family Medicine | Admitting: Family Medicine

## 2015-02-09 DIAGNOSIS — Z6838 Body mass index (BMI) 38.0-38.9, adult: Secondary | ICD-10-CM | POA: Diagnosis not present

## 2015-02-09 DIAGNOSIS — K852 Alcohol induced acute pancreatitis without necrosis or infection: Secondary | ICD-10-CM | POA: Diagnosis present

## 2015-02-09 DIAGNOSIS — R571 Hypovolemic shock: Secondary | ICD-10-CM | POA: Diagnosis present

## 2015-02-09 DIAGNOSIS — Z87891 Personal history of nicotine dependence: Secondary | ICD-10-CM

## 2015-02-09 DIAGNOSIS — M79651 Pain in right thigh: Secondary | ICD-10-CM | POA: Diagnosis not present

## 2015-02-09 DIAGNOSIS — E869 Volume depletion, unspecified: Secondary | ICD-10-CM | POA: Diagnosis present

## 2015-02-09 DIAGNOSIS — S3011XA Contusion of abdominal wall, initial encounter: Secondary | ICD-10-CM | POA: Diagnosis present

## 2015-02-09 DIAGNOSIS — R109 Unspecified abdominal pain: Secondary | ICD-10-CM | POA: Diagnosis present

## 2015-02-09 DIAGNOSIS — J45909 Unspecified asthma, uncomplicated: Secondary | ICD-10-CM | POA: Diagnosis present

## 2015-02-09 DIAGNOSIS — N179 Acute kidney failure, unspecified: Secondary | ICD-10-CM | POA: Diagnosis present

## 2015-02-09 DIAGNOSIS — R58 Hemorrhage, not elsewhere classified: Secondary | ICD-10-CM | POA: Diagnosis not present

## 2015-02-09 DIAGNOSIS — R000 Tachycardia, unspecified: Secondary | ICD-10-CM | POA: Diagnosis present

## 2015-02-09 DIAGNOSIS — I5032 Chronic diastolic (congestive) heart failure: Secondary | ICD-10-CM | POA: Diagnosis present

## 2015-02-09 DIAGNOSIS — R1909 Other intra-abdominal and pelvic swelling, mass and lump: Secondary | ICD-10-CM | POA: Diagnosis not present

## 2015-02-09 DIAGNOSIS — M79604 Pain in right leg: Secondary | ICD-10-CM | POA: Diagnosis not present

## 2015-02-09 DIAGNOSIS — I119 Hypertensive heart disease without heart failure: Secondary | ICD-10-CM | POA: Diagnosis present

## 2015-02-09 DIAGNOSIS — I82890 Acute embolism and thrombosis of other specified veins: Secondary | ICD-10-CM | POA: Diagnosis present

## 2015-02-09 DIAGNOSIS — M79601 Pain in right arm: Secondary | ICD-10-CM | POA: Diagnosis not present

## 2015-02-09 DIAGNOSIS — R509 Fever, unspecified: Secondary | ICD-10-CM | POA: Diagnosis not present

## 2015-02-09 DIAGNOSIS — F419 Anxiety disorder, unspecified: Secondary | ICD-10-CM | POA: Diagnosis not present

## 2015-02-09 DIAGNOSIS — E46 Unspecified protein-calorie malnutrition: Secondary | ICD-10-CM | POA: Diagnosis present

## 2015-02-09 DIAGNOSIS — R609 Edema, unspecified: Secondary | ICD-10-CM | POA: Diagnosis present

## 2015-02-09 DIAGNOSIS — K863 Pseudocyst of pancreas: Secondary | ICD-10-CM | POA: Diagnosis present

## 2015-02-09 DIAGNOSIS — R739 Hyperglycemia, unspecified: Secondary | ICD-10-CM | POA: Diagnosis not present

## 2015-02-09 DIAGNOSIS — E876 Hypokalemia: Secondary | ICD-10-CM | POA: Diagnosis not present

## 2015-02-09 DIAGNOSIS — IMO0002 Reserved for concepts with insufficient information to code with codable children: Secondary | ICD-10-CM | POA: Diagnosis present

## 2015-02-09 DIAGNOSIS — D62 Acute posthemorrhagic anemia: Secondary | ICD-10-CM | POA: Insufficient documentation

## 2015-02-09 DIAGNOSIS — T45515A Adverse effect of anticoagulants, initial encounter: Secondary | ICD-10-CM | POA: Diagnosis present

## 2015-02-09 DIAGNOSIS — D72829 Elevated white blood cell count, unspecified: Secondary | ICD-10-CM | POA: Diagnosis present

## 2015-02-09 DIAGNOSIS — I10 Essential (primary) hypertension: Secondary | ICD-10-CM | POA: Diagnosis not present

## 2015-02-09 DIAGNOSIS — S3690XD Unspecified injury of unspecified intra-abdominal organ, subsequent encounter: Secondary | ICD-10-CM | POA: Diagnosis not present

## 2015-02-09 DIAGNOSIS — E8770 Fluid overload, unspecified: Secondary | ICD-10-CM | POA: Diagnosis not present

## 2015-02-09 DIAGNOSIS — D6832 Hemorrhagic disorder due to extrinsic circulating anticoagulants: Secondary | ICD-10-CM | POA: Diagnosis present

## 2015-02-09 DIAGNOSIS — D735 Infarction of spleen: Secondary | ICD-10-CM | POA: Diagnosis present

## 2015-02-09 DIAGNOSIS — K859 Acute pancreatitis without necrosis or infection, unspecified: Secondary | ICD-10-CM | POA: Insufficient documentation

## 2015-02-09 DIAGNOSIS — S3690XA Unspecified injury of unspecified intra-abdominal organ, initial encounter: Secondary | ICD-10-CM | POA: Diagnosis not present

## 2015-02-09 DIAGNOSIS — J9600 Acute respiratory failure, unspecified whether with hypoxia or hypercapnia: Secondary | ICD-10-CM | POA: Diagnosis not present

## 2015-02-09 DIAGNOSIS — E669 Obesity, unspecified: Secondary | ICD-10-CM | POA: Diagnosis present

## 2015-02-09 DIAGNOSIS — K860 Alcohol-induced chronic pancreatitis: Secondary | ICD-10-CM | POA: Diagnosis not present

## 2015-02-09 DIAGNOSIS — E8809 Other disorders of plasma-protein metabolism, not elsewhere classified: Secondary | ICD-10-CM | POA: Diagnosis present

## 2015-02-09 DIAGNOSIS — S301XXA Contusion of abdominal wall, initial encounter: Secondary | ICD-10-CM | POA: Diagnosis present

## 2015-02-09 DIAGNOSIS — K625 Hemorrhage of anus and rectum: Secondary | ICD-10-CM | POA: Diagnosis present

## 2015-02-09 DIAGNOSIS — R102 Pelvic and perineal pain: Secondary | ICD-10-CM

## 2015-02-09 DIAGNOSIS — R7989 Other specified abnormal findings of blood chemistry: Secondary | ICD-10-CM | POA: Diagnosis not present

## 2015-02-09 DIAGNOSIS — F1010 Alcohol abuse, uncomplicated: Secondary | ICD-10-CM | POA: Diagnosis present

## 2015-02-09 DIAGNOSIS — IMO0001 Reserved for inherently not codable concepts without codable children: Secondary | ICD-10-CM

## 2015-02-09 DIAGNOSIS — Z452 Encounter for adjustment and management of vascular access device: Secondary | ICD-10-CM

## 2015-02-09 DIAGNOSIS — Y92009 Unspecified place in unspecified non-institutional (private) residence as the place of occurrence of the external cause: Secondary | ICD-10-CM

## 2015-02-09 DIAGNOSIS — S3692XA Contusion of unspecified intra-abdominal organ, initial encounter: Secondary | ICD-10-CM

## 2015-02-09 DIAGNOSIS — F1020 Alcohol dependence, uncomplicated: Secondary | ICD-10-CM | POA: Diagnosis not present

## 2015-02-09 DIAGNOSIS — T45511A Poisoning by anticoagulants, accidental (unintentional), initial encounter: Secondary | ICD-10-CM | POA: Diagnosis not present

## 2015-02-09 DIAGNOSIS — I959 Hypotension, unspecified: Secondary | ICD-10-CM | POA: Diagnosis not present

## 2015-02-09 DIAGNOSIS — R600 Localized edema: Secondary | ICD-10-CM | POA: Diagnosis not present

## 2015-02-09 DIAGNOSIS — T148XXA Other injury of unspecified body region, initial encounter: Secondary | ICD-10-CM

## 2015-02-09 LAB — COMPREHENSIVE METABOLIC PANEL
ALBUMIN: 2.4 g/dL — AB (ref 3.5–5.0)
ALK PHOS: 198 U/L — AB (ref 38–126)
ALK PHOS: 200 U/L — AB (ref 38–126)
ALT: 26 U/L (ref 14–54)
ALT: 25 U/L (ref 14–54)
AST: 53 U/L — ABNORMAL HIGH (ref 15–41)
AST: 50 U/L — ABNORMAL HIGH (ref 15–41)
Albumin: 2.5 g/dL — ABNORMAL LOW (ref 3.5–5.0)
Anion gap: 8 (ref 5–15)
Anion gap: 10 (ref 5–15)
BILIRUBIN TOTAL: 1.1 mg/dL (ref 0.3–1.2)
BUN: <5 mg/dL — ABNORMAL LOW (ref 6–20)
CALCIUM: 8.6 mg/dL — AB (ref 8.9–10.3)
CALCIUM: 8.7 mg/dL — AB (ref 8.9–10.3)
CHLORIDE: 101 mmol/L (ref 101–111)
CO2: 28 mmol/L (ref 22–32)
CO2: 25 mmol/L (ref 22–32)
CREATININE: 0.57 mg/dL (ref 0.44–1.00)
CREATININE: 0.5 mg/dL (ref 0.44–1.00)
Chloride: 103 mmol/L (ref 101–111)
GFR calc non Af Amer: >60 mL/min (ref >60)
GLUCOSE: 115 mg/dL — AB (ref 65–99)
Glucose, Bld: 130 mg/dL — ABNORMAL HIGH (ref 65–99)
Potassium: 4.5 mmol/L (ref 3.5–5.1)
Potassium: 4.4 mmol/L (ref 3.5–5.1)
SODIUM: 139 mmol/L (ref 135–145)
Sodium: 136 mmol/L (ref 135–145)
TOTAL PROTEIN: 6.3 g/dL — AB (ref 6.5–8.1)
Total Bilirubin: 1.6 mg/dL — ABNORMAL HIGH (ref 0.3–1.2)
Total Protein: 6 g/dL — ABNORMAL LOW (ref 6.5–8.1)

## 2015-02-09 LAB — URINE MICROSCOPIC-ADD ON

## 2015-02-09 LAB — CBC
HEMATOCRIT: 35.5 % — AB (ref 36.0–46.0)
Hemoglobin: 11.4 g/dL — ABNORMAL LOW (ref 12.0–15.0)
MCH: 32.3 pg (ref 26.0–34.0)
MCHC: 32.1 g/dL (ref 30.0–36.0)
MCV: 100.6 fL — AB (ref 78.0–100.0)
Platelets: 363 10*3/uL (ref 150–400)
RBC: 3.53 MIL/uL — ABNORMAL LOW (ref 3.87–5.11)
RDW: 14.9 % (ref 11.5–15.5)
WBC: 11.2 10*3/uL — AB (ref 4.0–10.5)

## 2015-02-09 LAB — CBC WITH DIFFERENTIAL/PLATELET
Basophils Absolute: 0.1 10*3/uL (ref 0.0–0.1)
Basophils Relative: 1 %
EOS PCT: 0 %
Eosinophils Absolute: 0 10*3/uL (ref 0.0–0.7)
HCT: 36.3 % (ref 36.0–46.0)
Hemoglobin: 11.7 g/dL — ABNORMAL LOW (ref 12.0–15.0)
LYMPHS ABS: 1.4 10*3/uL (ref 0.7–4.0)
LYMPHS PCT: 9 %
MCH: 31.9 pg (ref 26.0–34.0)
MCHC: 32.2 g/dL (ref 30.0–36.0)
MCV: 98.9 fL (ref 78.0–100.0)
MONO ABS: 0.8 10*3/uL (ref 0.1–1.0)
Monocytes Relative: 5 %
Neutro Abs: 13.4 10*3/uL — ABNORMAL HIGH (ref 1.7–7.7)
Neutrophils Relative %: 86 %
PLATELETS: 412 10*3/uL — AB (ref 150–400)
RBC: 3.67 MIL/uL — AB (ref 3.87–5.11)
RDW: 14.5 % (ref 11.5–15.5)
WBC: 15.7 10*3/uL — AB (ref 4.0–10.5)

## 2015-02-09 LAB — URINALYSIS, ROUTINE W REFLEX MICROSCOPIC
BILIRUBIN URINE: NEGATIVE
GLUCOSE, UA: NEGATIVE mg/dL
Ketones, ur: 40 mg/dL — AB
Leukocytes, UA: NEGATIVE
Nitrite: NEGATIVE
PH: 7.5 (ref 5.0–8.0)
Protein, ur: NEGATIVE mg/dL
SPECIFIC GRAVITY, URINE: 1.013 (ref 1.005–1.030)
Urobilinogen, UA: 0.2 mg/dL (ref 0.0–1.0)

## 2015-02-09 LAB — PROTIME-INR
INR: 2.69 — ABNORMAL HIGH (ref 0.00–1.49)
INR: 2.29 — ABNORMAL HIGH (ref 0.00–1.49)
Prothrombin Time: 28.2 seconds — ABNORMAL HIGH (ref 11.6–15.2)
Prothrombin Time: 25 seconds — ABNORMAL HIGH (ref 11.6–15.2)

## 2015-02-09 LAB — WET PREP, GENITAL
Clue Cells Wet Prep HPF POC: NONE SEEN
Trich, Wet Prep: NONE SEEN
WBC, Wet Prep HPF POC: NONE SEEN
Yeast Wet Prep HPF POC: NONE SEEN

## 2015-02-09 LAB — PREGNANCY, URINE: Preg Test, Ur: NEGATIVE

## 2015-02-09 LAB — LIPASE, BLOOD: LIPASE: 43 U/L (ref 11–51)

## 2015-02-09 MED ORDER — CEFUROXIME AXETIL 500 MG PO TABS: 500.0000 mg | ORAL_TABLET | Freq: Two times a day (BID) | ORAL | Status: DC

## 2015-02-09 MED ORDER — ONDANSETRON HCL 4 MG/2ML IJ SOLN
4.0000 mg | Freq: Once | INTRAMUSCULAR | Status: AC
  Administered 2015-02-09: 4 mg via INTRAVENOUS
  Filled 2015-02-09: qty 2

## 2015-02-09 MED ORDER — FUROSEMIDE 20 MG PO TABS: 20.0000 mg | ORAL_TABLET | Freq: Two times a day (BID) | ORAL | Status: DC

## 2015-02-09 MED ORDER — HYDROMORPHONE HCL 1 MG/ML IJ SOLN
1.0000 mg | Freq: Once | INTRAMUSCULAR | Status: AC
  Administered 2015-02-09: 1 mg via INTRAVENOUS
  Filled 2015-02-09: qty 1

## 2015-02-09 MED ORDER — LORAZEPAM 2 MG/ML IJ SOLN
1.0000 mg | Freq: Once | INTRAMUSCULAR | Status: AC
  Administered 2015-02-09: 1 mg via INTRAVENOUS
  Filled 2015-02-09: qty 1

## 2015-02-09 MED ORDER — WARFARIN SODIUM 5 MG PO TABS: 5.0000 mg | ORAL_TABLET | Freq: Every day | ORAL | Status: DC

## 2015-02-09 MED ORDER — METRONIDAZOLE 500 MG PO TABS: 500.0000 mg | ORAL_TABLET | Freq: Three times a day (TID) | ORAL | Status: DC

## 2015-02-09 MED ORDER — IOHEXOL 300 MG/ML  SOLN
100.0000 mL | Freq: Once | INTRAMUSCULAR | Status: AC | PRN
  Administered 2015-02-09: 100 mL via INTRAVENOUS

## 2015-02-09 MED ORDER — OXYCODONE HCL 5 MG PO TABS: 5.0000 mg | ORAL_TABLET | Freq: Four times a day (QID) | ORAL | Status: DC | PRN

## 2015-02-09 MED ORDER — POTASSIUM CHLORIDE CRYS ER 20 MEQ PO TBCR: 20.0000 meq | EXTENDED_RELEASE_TABLET | Freq: Every day | ORAL | Status: DC

## 2015-02-09 MED ORDER — KETOROLAC TROMETHAMINE 30 MG/ML IJ SOLN
30.0000 mg | Freq: Once | INTRAMUSCULAR | Status: AC
  Administered 2015-02-09: 30 mg via INTRAVENOUS
  Filled 2015-02-09: qty 1

## 2015-02-09 MED ORDER — SODIUM CHLORIDE 0.9 % IV SOLN
INTRAVENOUS | Status: DC
  Administered 2015-02-10: via INTRAVENOUS

## 2015-02-09 MED ORDER — SODIUM CHLORIDE 0.9 % IV BOLUS (SEPSIS)
1000.0000 mL | Freq: Once | INTRAVENOUS | Status: AC
  Administered 2015-02-09: 1000 mL via INTRAVENOUS

## 2015-02-09 MED ORDER — PHYTONADIONE 5 MG PO TABS
5.0000 mg | ORAL_TABLET | Freq: Once | ORAL | Status: AC
  Administered 2015-02-10: 5 mg via ORAL
  Filled 2015-02-09: qty 1

## 2015-02-09 MED ORDER — ONDANSETRON HCL 4 MG PO TABS: 4.0000 mg | ORAL_TABLET | Freq: Three times a day (TID) | ORAL | Status: DC | PRN

## 2015-02-09 NOTE — ED Notes (Signed)
Pelvic supplies at bedside. 

## 2015-02-09 NOTE — ED Notes (Signed)
Patient transported to CT 

## 2015-02-09 NOTE — ED Notes (Signed)
Family at bedside. 

## 2015-02-09 NOTE — ED Notes (Signed)
Bed: WA23 Expected date:  Expected time:  Means of arrival:  Comments: EMS 

## 2015-02-09 NOTE — Progress Notes (Signed)
Patient discharged.  Leaving with prescriptions.  Patient reports her pain is managed at discharge.  Husband at bedside.  Room air, no s/s of distress.  No complaints from patient.

## 2015-02-09 NOTE — ED Provider Notes (Signed)
CSN: [id number]     Arrival date & time 02/09/15  1949 History   First MD Initiated Contact with Patient 02/09/15 2012     Chief Complaint  Patient presents with  . Groin Pain  . Back Pain  . Abdominal Pain     (Consider location/radiation/quality/duration/timing/severity/associated sxs/prior Treatment) HPI Comments: Patient presents with severe suprapubic and low back pain onset this afternoon. 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 She states that the way home she began to complain of severe pain in her low back and lower abdomen. She denies any dysuria hematuria. Denies any fever. She took oxycodone without relief. Pain is constant, nothing makes it better or worse. She denies any diarrhea. She denies any chest pain or shortness of breath. This pain is different than her pancreatitis type pain. She's never had this pain in the past. She denies any possible pregnancy or bleeding. Patient also states she's had progressively worsening edema of her lower extremities over the past 2 days.  Patient is a 29 y.o. female presenting with back pain and abdominal pain. The history is provided by the patient.  Back Pain Associated symptoms: abdominal pain   Associated symptoms: no dysuria, no fever, no headaches and no weakness   Abdominal Pain Associated symptoms: nausea and vomiting   Associated symptoms: no cough, no diarrhea, no dysuria, no fever, no shortness of breath, no vaginal bleeding and no vaginal discharge     Past Medical History  Diagnosis Date  . Asthma   . GERD (gastroesophageal reflux disease)   . Alcohol abuse   . Environmental allergies   . Headache   . Hypertension   . Syncope and collapse   . Vision abnormalities   . Prolonged QT syndrome    Past Surgical History  Procedure Laterality Date  . Tympanostomy tube placement    . Esophagogastroduodenoscopy N/A 07/17/2014    Procedure:  ESOPHAGOGASTRODUODENOSCOPY (EGD);  Surgeon: Dorena CookeyJohn Hayes, MD;  Location: [facility];  Service: Endoscopy;  Laterality: N/A;   Family History  Problem Relation Age of Onset  . Heart failure Mother   . Heart failure Father   . Heart failure Maternal Grandfather    Social History  Substance Use Topics  . Smoking status: Former Smoker -- 0.50 packs/day for 2 years    Types: Cigarettes    Quit date: 04/08/2012  . Smokeless tobacco: Never Used  . Alcohol Use: 1.8 oz/week    0 Standard drinks or equivalent, 3 Shots of liquor per week     Comment: occasionally   OB History    No data available     Review of Systems  Constitutional: Positive for activity change and appetite change. Negative for fever.  HENT: Negative for congestion and rhinorrhea.   Respiratory: Negative for cough, chest tightness and shortness of breath.   Gastrointestinal: Positive for nausea, vomiting and abdominal pain. Negative for diarrhea.  Genitourinary: Negative for dysuria, vaginal bleeding and vaginal discharge.  Musculoskeletal: Positive for back pain. Negative for myalgias and arthralgias.  Skin: Negative for rash.  Neurological: Negative for dizziness, weakness, light-headedness and headaches.  A complete 10 system review of systems was obtained and all systems are negative except as noted in the HPI and PMH.      Allergies  Peanuts  Home Medications   Prior to Admission medications   Medication Sig Start Date End Date Taking? Authorizing Provider  albuterol (PROVENTIL HFA;VENTOLIN HFA) 108 (  90 BASE) MCG/ACT inhaler Inhale 2 puffs into the lungs every 6 (six) hours as needed for wheezing or shortness of breath (wheezing and sob).    Yes Historical Provider, MD  cefUROXime (CEFTIN) 500 MG tablet Take 1 tablet (500 mg total) by mouth 2 (two) times daily with a meal. For 4days 02/09/15  Yes Zannie Cove, MD  fexofenadine (ALLEGRA) 180 MG tablet Take 180 mg by mouth daily as needed for allergies  (allergies).    Yes Historical Provider, MD  gabapentin (NEURONTIN) 300 MG capsule One in am, one in evening and two po at night Patient taking differently: Take 300-600 mg by mouth 3 (three) times daily. One in am, one in evening and two po at night 09/02/14  Yes Asa Lente, MD  metoprolol succinate (TOPROL-XL) 25 MG 24 hr tablet Take 1 tablet (25 mg total) by mouth daily. 06/17/14  Yes Wendall Stade, MD  metroNIDAZOLE (FLAGYL) 500 MG tablet Take 1 tablet (500 mg total) by mouth every 8 (eight) hours. For 4days 02/09/15  Yes Zannie Cove, MD  mometasone-formoterol [facility]) 200-5 MCG/ACT AERO Inhale 2 puffs into the lungs 2 (two) times daily.   Yes Historical Provider, MD  montelukast (SINGULAIR) 10 MG tablet Take 10 mg by mouth daily.    Yes Historical Provider, MD  Multiple Vitamin (MULTIVITAMIN WITH MINERALS) TABS tablet Take 1 tablet by mouth daily. 07/19/14  Yes Belkys A Regalado, MD  oxyCODONE (OXY IR/ROXICODONE) 5 MG immediate release tablet Take 1 tablet (5 mg total) by mouth every 6 (six) hours as needed for severe pain. 02/09/15  Yes Zannie Cove, MD  potassium chloride SA (K-DUR,KLOR-CON) 20 MEQ tablet Take 1 tablet (20 mEq total) by mouth daily. Take while on Lasix 02/09/15  Yes Zannie Cove, MD  furosemide (LASIX) 20 MG tablet Take 1 tablet (20 mg total) by mouth 2 (two) times daily. For 3-4days, for leg swelling Patient not taking: Reported on 02/09/2015 02/09/15   Zannie Cove, MD  ondansetron (ZOFRAN) 4 MG tablet Take 1 tablet (4 mg total) by mouth every 8 (eight) hours as needed for nausea or vomiting. Patient not taking: Reported on 02/09/2015 02/09/15   Zannie Cove, MD  polyethylene glycol [facility]) packet Take 17 g by mouth daily as needed. Patient not taking: Reported on 10/29/2014 07/19/14   Belkys A Regalado, MD  warfarin (COUMADIN) 5 MG tablet Take 1 tablet (5 mg total) by mouth daily. Dose may be adjusted based on INR check Wednesday Patient not taking:  Reported on 02/09/2015 02/09/15   Zannie Cove, MD   BP 136/92 mmHg  Pulse 106  Temp(Src) 98.6 F (37 C) (Oral)  Resp 21  SpO2 95%  LMP 12/30/2014 (LMP Unknown) Physical Exam  Constitutional: She is oriented to person, place, and time. She appears well-developed and well-nourished. She appears distressed.  Uncomfortable, anxious appearing  HENT:  Head: Normocephalic and atraumatic.  Mouth/Throat: Oropharynx is clear and moist. No oropharyngeal exudate.  Eyes: Conjunctivae and EOM are normal. Pupils are equal, round, and reactive to light.  Neck: Normal range of motion. Neck supple.  No meningismus.  Cardiovascular: Normal rate, normal heart sounds and intact distal pulses.   No murmur heard. Tachycardic  Pulmonary/Chest: Effort normal and breath sounds normal. No respiratory distress.  Abdominal: Soft. There is tenderness. There is no rebound and no guarding.  Suprapubic tenderness without guarding or rebound. Ecchymosis to left lower abdomen aside a previous Lovenox injections.  Genitourinary:  Chaperone present. Normal external genitalia.  No CMT.  Generally painful exam.  Mostly suprapubic.  No significant discharge.  Musculoskeletal: Normal range of motion. She exhibits edema and tenderness.  Paraspinal low back pain, no midline pain. Normal rectal tone. Chaperone present.  Neurological: She is alert and oriented to person, place, and time. No cranial nerve deficit. She exhibits normal muscle tone. Coordination normal.  No ataxia on finger to nose bilaterally. No pronator drift. 5/5 strength throughout. CN 2-12 intact. Negative Romberg. Equal grip strength. Sensation intact. Gait is normal.   Skin: Skin is warm.  Psychiatric: She has a normal mood and affect. Her behavior is normal.  Nursing note and vitals reviewed.   ED Course  Procedures (including critical care time) Labs Review Labs Reviewed  URINALYSIS, ROUTINE W REFLEX MICROSCOPIC (NOT AT [facility]) - Abnormal; Notable  for the following:    Hgb urine dipstick TRACE (*)    Ketones, ur 40 (*)    All other components within normal limits  CBC WITH DIFFERENTIAL/PLATELET - Abnormal; Notable for the following:    WBC 15.7 (*)    RBC 3.67 (*)    Hemoglobin 11.7 (*)    Platelets 412 (*)    Neutro Abs 13.4 (*)    All other components within normal limits  COMPREHENSIVE METABOLIC PANEL - Abnormal; Notable for the following:    Glucose, Bld 130 (*)    BUN <5 (*)    Calcium 8.7 (*)    Total Protein 6.3 (*)    Albumin 2.5 (*)    AST 50 (*)    Alkaline Phosphatase 200 (*)    Total Bilirubin 1.6 (*)    All other components within normal limits  PROTIME-INR - Abnormal; Notable for the following:    Prothrombin Time 25.0 (*)    INR 2.29 (*)    All other components within normal limits  URINE MICROSCOPIC-ADD ON - Abnormal; Notable for the following:    Squamous Epithelial / LPF FEW (*)    All other components within normal limits  WET PREP, GENITAL  PREGNANCY, URINE  LIPASE, BLOOD  TYPE AND SCREEN  PREPARE FRESH FROZEN PLASMA  GC/CHLAMYDIA PROBE AMP ([place]) NOT AT [facility]    Imaging Review US Transvaginal Non-ob  02/09/2015  ADDENDUM REPORT: 02/09/2015 23:43 ADDENDUM: The Doppler findings should read: Pulsed Doppler evaluation of the right ovary demonstrates normal low-resistance arterial and venous waveforms. The left ovary is not visualized on this study. Subsequent CT demonstrates a grossly unremarkable appearance to both ovaries. Electronically Signed   By: Roanna Raider M.D.   On: 02/09/2015 23:43  02/09/2015  CLINICAL DATA:  Acute onset of pelvic pain.  Initial encounter. EXAM: TRANSABDOMINAL AND TRANSVAGINAL ULTRASOUND OF PELVIS DOPPLER ULTRASOUND OF OVARIES TECHNIQUE: Both transabdominal and transvaginal ultrasound examinations of the pelvis were performed. Transabdominal technique was performed for global imaging of the pelvis including uterus, ovaries, adnexal regions, and pelvic cul-de-sac. It  was necessary to proceed with endovaginal exam following the transabdominal exam to visualize the uterus and ovaries in greater detail. Color and duplex Doppler ultrasound was utilized to evaluate blood flow to the ovaries. COMPARISON:  CT of the abdomen and pelvis from 02/01/2015 FINDINGS: Uterus Measurements: 4.6 x 2.0 x 2.6 cm. No fibroids or other mass visualized. Endometrium Thickness: 0.2 cm.  No focal abnormality visualized. Right ovary Measurements: 2.9 x 2.2 x 2.7 cm. Normal appearance/no adnexal mass. Left ovary Not visualized. Pulsed Doppler evaluation of both ovaries demonstrates normal low-resistance arterial and venous waveforms. Other findings A small  amount of free fluid is seen within the pelvic cul-de-sac. IMPRESSION: Unremarkable pelvic ultrasound. No evidence for ovarian torsion. The left ovary is not visualized on this study. Electronically Signed: By: Roanna Raider M.D. On: 02/09/2015 23:08   US Pelvis Complete  02/09/2015  ADDENDUM REPORT: 02/09/2015 23:43 ADDENDUM: The Doppler findings should read: Pulsed Doppler evaluation of the right ovary demonstrates normal low-resistance arterial and venous waveforms. The left ovary is not visualized on this study. Subsequent CT demonstrates a grossly unremarkable appearance to both ovaries. Electronically Signed   By: Roanna Raider M.D.   On: 02/09/2015 23:43  02/09/2015  CLINICAL DATA:  Acute onset of pelvic pain.  Initial encounter. EXAM: TRANSABDOMINAL AND TRANSVAGINAL ULTRASOUND OF PELVIS DOPPLER ULTRASOUND OF OVARIES TECHNIQUE: Both transabdominal and transvaginal ultrasound examinations of the pelvis were performed. Transabdominal technique was performed for global imaging of the pelvis including uterus, ovaries, adnexal regions, and pelvic cul-de-sac. It was necessary to proceed with endovaginal exam following the transabdominal exam to visualize the uterus and ovaries in greater detail. Color and duplex Doppler ultrasound was utilized  to evaluate blood flow to the ovaries. COMPARISON:  CT of the abdomen and pelvis from 02/01/2015 FINDINGS: Uterus Measurements: 4.6 x 2.0 x 2.6 cm. No fibroids or other mass visualized. Endometrium Thickness: 0.2 cm.  No focal abnormality visualized. Right ovary Measurements: 2.9 x 2.2 x 2.7 cm. Normal appearance/no adnexal mass. Left ovary Not visualized. Pulsed Doppler evaluation of both ovaries demonstrates normal low-resistance arterial and venous waveforms. Other findings A small amount of free fluid is seen within the pelvic cul-de-sac. IMPRESSION: Unremarkable pelvic ultrasound. No evidence for ovarian torsion. The left ovary is not visualized on this study. Electronically Signed: By: Roanna Raider M.D. On: 02/09/2015 23:08   Ct Abdomen Pelvis W Contrast  02/09/2015  CLINICAL DATA:  Acute onset of pelvic and lower abdominal pain. Lower back pain. Initial encounter. EXAM: CT ABDOMEN AND PELVIS WITH CONTRAST TECHNIQUE: Multidetector CT imaging of the abdomen and pelvis was performed using the standard protocol following bolus administration of intravenous contrast. CONTRAST:  OMNIPAQUE IOHEXOL 300 MG/ML  SOLN COMPARISON:  CT of the abdomen and pelvis performed 02/01/2015, and pelvic ultrasound performed earlier today at 10:21 p.m. FINDINGS: A small left pleural effusion is noted, with bibasilar atelectasis. Since the prior study, there has been interval development of a large heterogeneous 13.7 x 10.0 x 7.4 cm collection of fluid at the left mid abdomen and left lower quadrant, tracking inferiorly from the pancreatic tail, with multiple small foci of high attenuation. It is noted anterior to the left kidney and left psoas musculature. This is highly suspicious for a new intra-abdominal hematoma, with multiple small foci of contrast extravasation, reflecting active bleeding. This is a very unusual appearance for hemorrhagic pancreatitis. New retroperitoneal dense soft tissue inflammation is also noted  about the level of the aortic bifurcation, measuring 7.5 x 5.2 x 2.9 cm, concerning for extension of soft tissue inflammation into the inferior retroperitoneum. New evolving pseudocysts are noted about the pancreatic tail, measuring up to 5.2 cm in size. One of these fluid collections extends adjacent to small bowel loops. Previously noted soft tissue inflammation about the head and body of the pancreas has improved. There is no evidence of devascularization. There is diffuse fatty infiltration within the liver. The spleen is unremarkable in appearance, though inflammatory fluid is seen tracking about the spleen. The gallbladder is grossly unremarkable. The adrenal glands are grossly unremarkable, though the left adrenal gland is  difficult to fully assess due to surrounding fluid. The kidneys are unremarkable in appearance. There is no evidence of hydronephrosis. No renal or ureteral stones are seen. No perinephric stranding is appreciated. Minimal left-sided perinephric fluid likely reflects the overlying hematoma. The small bowel is unremarkable in appearance. A vague evolving pseudocyst is noted at the fundus of the stomach, similar in appearance to the prior study, measuring approximately 4.0 cm. The stomach is otherwise grossly unremarkable in appearance. The appendix is normal in caliber, without evidence of appendicitis. The colon is decompressed and not well assessed, but appears grossly unremarkable. The bladder is decompressed. Somewhat complex fluid is noted tracking within the pelvis, likely extending from the patient's pancreatitis. The uterus is unremarkable in appearance. The ovaries are grossly symmetric. No suspicious adnexal masses are seen. No inguinal lymphadenopathy is seen. No acute osseous abnormalities are identified. IMPRESSION: 1. Interval development of a large heterogeneous 13.7 x 10.0 x 7.4 cm collection of fluid at the left mid abdomen and left lower quadrant, tracking inferiorly from  the pancreatic tail anterior to the left kidney and left psoas musculature. Multiple small foci of high attenuation noted within the collection. This is highly suspicious for a new intra-abdominal hematoma, with multiple small foci of active bleeding. This may reflect pancreatic enzymes eroding into tiny surrounding vessels, given that the patient is on anticoagulation. This is a very unusual appearance for hemorrhagic pancreatitis. 2. Additional new retroperitoneal dense soft tissue inflammation noted about the level of the aortic bifurcation, measuring 7.5 x 5.2 x 2.9 cm, concerning for extension of soft tissue inflammation into the inferior retroperitoneum. 3. New evolving pseudocysts about the pancreatic tail, measuring up to 5.2 cm in size. One of these fluid collections extends adjacent to small bowel loops, though the small bowel is unremarkable in appearance. Soft tissue inflammation about the head and body of the pancreas has improved. No evidence of devascularization. 4. 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 Complex fluid tracks about the spleen. 5. Somewhat complex fluid noted tracking within the pelvis, likely extending from the patient's pancreatitis. 6. Small left pleural effusion noted, with bibasilar atelectasis. 7. Diffuse fatty infiltration within the liver. Critical Value/emergent results were called by telephone at the time of interpretation on 02/09/2015 at 11:35 pm to Dr. Glynn Octave, who verbally acknowledged these results. Electronically Signed   By: Roanna Raider M.D.   On: 02/09/2015 23:57   [place] Art/ven Flow Abd Pelv Doppler  02/09/2015  ADDENDUM REPORT: 02/09/2015 23:43 ADDENDUM: The Doppler findings should read: Pulsed Doppler evaluation of the right ovary demonstrates normal low-resistance arterial and venous waveforms. The left ovary is not visualized on this study. Subsequent CT demonstrates a  grossly unremarkable appearance to both ovaries. Electronically Signed   By: Roanna Raider M.D.   On: 02/09/2015 23:43  02/09/2015  CLINICAL DATA:  Acute onset of pelvic pain.  Initial encounter. EXAM: TRANSABDOMINAL AND TRANSVAGINAL ULTRASOUND OF PELVIS DOPPLER ULTRASOUND OF OVARIES TECHNIQUE: Both transabdominal and transvaginal ultrasound examinations of the pelvis were performed. Transabdominal technique was performed for global imaging of the pelvis including uterus, ovaries, adnexal regions, and pelvic cul-de-sac. It was necessary to proceed with endovaginal exam following the transabdominal exam to visualize the uterus and ovaries in greater detail. Color and duplex Doppler ultrasound was utilized to evaluate blood flow to the ovaries. COMPARISON:  CT of the abdomen and pelvis from 02/01/2015 FINDINGS: Uterus Measurements: 4.6 x 2.0 x  2.6 cm. No fibroids or other mass visualized. Endometrium Thickness: 0.2 cm.  No focal abnormality visualized. Right ovary Measurements: 2.9 x 2.2 x 2.7 cm. Normal appearance/no adnexal mass. Left ovary Not visualized. Pulsed Doppler evaluation of both ovaries demonstrates normal low-resistance arterial and venous waveforms. Other findings A small amount of free fluid is seen within the pelvic cul-de-sac. IMPRESSION: Unremarkable pelvic ultrasound. No evidence for ovarian torsion. The left ovary is not visualized on this study. Electronically Signed: By: Roanna Raider M.D. On: 02/09/2015 23:08   Dg Chest Portable 1 View  02/09/2015  CLINICAL DATA:  29 year old female with severe lower abdominal pain and shortness of breath. EXAM: PORTABLE CHEST 1 VIEW COMPARISON:  Chest x-ray 01/31/2015. FINDINGS: Small bilateral pleural effusions (left greater than right). Bibasilar opacities favored to predominantly reflect subsegmental atelectasis (underlying airspace consolidation in the left lower lobe is not excluded). Mid to upper lungs are clear. Pulmonary venous congestion,  without frank pulmonary edema. Heart size appears borderline enlarged. Upper mediastinal contours are slightly distorted by patient positioning. IMPRESSION: 1. Small bilateral pleural effusions (left greater than right) with probable bibasilar subsegmental atelectasis. 2. Borderline cardiomegaly with mild pulmonary venous congestion, but no frank pulmonary edema. Electronically Signed   By: Trudie Reed M.D.   On: 02/09/2015 21:10   I have personally reviewed and evaluated these images and lab results as part of my medical decision-making.   EKG Interpretation None      MDM   Final diagnoses:  Pelvic pain in female  Intra-abdominal hematoma, initial encounter  Warfarin-induced coagulopathy (HCC)   Recent admission for pancreatitis now with lower abdominal pain and back pain with nausea and vomiting 1.  Urinalysis negative. Pelvic exam is benign. Ultrasound is unremarkable but left ovary is not July's. No right ovarian torsion.  Patient has persistent pain and tachycardia. CT scan will be obtained. Her hemoglobin is stable.  CT shows multiple concerning findings including large intra-abdominal hematoma with active bleeding as well as retroperitoneal hematoma. This is possibly from hemorrhagic pancreatitis. Patient is on Coumadin with INR 2.3.  Discussed with Dr. Daphine Deutscher of general surgery. He agrees with conservative monitoring at this point with serial hemoglobins. Advises to reverse Coumadin which patient is on for splenic vein thrombus. Dr. Daphine Deutscher suggested FFP and vitamin K and not K Centra at this time given patient's stability.  BP and mental status stable.  HR 100s.  FFP and vitamin K ordered.  D/w Dr. Lovell Sheehan who will admit to step down unit. Patient and family updated.  CRITICAL CARE Performed by: Glynn Octave Total critical care time: 45 Critical care time was exclusive of separately billable procedures and treating other patients. Critical care was necessary to  treat or prevent imminent or life-threatening deterioration. Critical care was time spent personally by me on the following activities: development of treatment plan with patient and/or surrogate as well as nursing, discussions with consultants, evaluation of patient's response to treatment, examination of patient, obtaining history from patient or surrogate, ordering and performing treatments and interventions, ordering and review of laboratory studies, ordering and review of radiographic studies, pulse oximetry and re-evaluation of patient's condition.    Glynn Octave, MD 02/10/15 [phone]

## 2015-02-09 NOTE — H&P (Signed)
[facility] Admission History and Physical       Brittain Smithey ZOX:[id number] DOB: [birth year] DOA: 02/09/2015  Referring physician: EDP PCP:  Duane Lope, MD  Specialists:   Chief Complaint: Severe Lower ABD Pain and  Low Back Pain  HPI: Kara [name] is a 29 y.o. female with recent hospitalization for Alcoholic Pancreatitis and Splenic Vein Thrombosis  Who was discharged on 10/23 and returns due to complaints of 10/10 pain in the lower ABD and Lower back  Described as dull and constant.  An extensive workup was performed in the ED, and a Ct scan of the ABD /Pelvis revealed an Intra-Abdominal Hematoma.   Her PT/INR was found to be 2.29, and Genreral Surgery was consulted.   FFP and Vitamin K was ordered to reverse her INR and she was admitted to [facility] for monitoring.       Review of Systems:  Constitutional: No Weight Loss, No Weight Gain, Night Sweats, Fevers, Chills, Dizziness, Light Headedness, Fatigue, or Generalized Weakness HEENT: No Headaches, Difficulty Swallowing,Tooth/Dental Problems,Sore Throat,  No Sneezing, Rhinitis, Ear Ache, Nasal Congestion, or Post Nasal Drip,  Cardio-vascular:  No Chest pain, Orthopnea, PND, Edema in Lower Extremities, Anasarca, Dizziness, Palpitations  Resp: No Dyspnea, No DOE, No Productive Cough, No Non-Productive Cough, No Hemoptysis, No Wheezing.    GI: No Heartburn, Indigestion, +Abdominal Pain, Nausea, Vomiting, Diarrhea, Constipation, Hematemesis, Hematochezia, Melena, Change in Bowel Habits,  Loss of Appetite  GU: No Dysuria, No Change in Color of Urine, No Urgency or Urinary Frequency, No Flank pain.  Musculoskeletal: No Joint Pain or Swelling, No Decreased Range of Motion, +Low Back Pain.  Neurologic: No Syncope, No Seizures, Muscle Weakness, Paresthesia, Vision Disturbance or Loss, No Diplopia, No Vertigo, No Difficulty Walking,  Skin: No Rash or Lesions. Psych: No Change in Mood or Affect, No Depression or Anxiety, No Memory loss,  No Confusion, or Hallucinations   Past Medical History  Diagnosis Date  . Asthma   . GERD (gastroesophageal reflux disease)   . Alcohol abuse   . Environmental allergies   . Headache   . Hypertension   . Syncope and collapse   . Vision abnormalities   . Prolonged QT syndrome      Past Surgical History  Procedure Laterality Date  . Tympanostomy tube placement    . Esophagogastroduodenoscopy N/A 07/17/2014    Procedure: ESOPHAGOGASTRODUODENOSCOPY (EGD);  Surgeon: Dorena Cookey, MD;  Location: [facility];  Service: Endoscopy;  Laterality: N/A;      Prior to Admission medications   Medication Sig Start Date End Date Taking? Authorizing Provider  albuterol (PROVENTIL HFA;VENTOLIN HFA) 108 (90 BASE) MCG/ACT inhaler Inhale 2 puffs into the lungs every 6 (six) hours as needed for wheezing or shortness of breath (wheezing and sob).    Yes Historical Provider, MD  cefUROXime (CEFTIN) 500 MG tablet Take 1 tablet (500 mg total) by mouth 2 (two) times daily with a meal. For 4days 02/09/15  Yes Zannie Cove, MD  fexofenadine (ALLEGRA) 180 MG tablet Take 180 mg by mouth daily as needed for allergies (allergies).    Yes Historical Provider, MD  gabapentin (NEURONTIN) 300 MG capsule One in am, one in evening and two po at night Patient taking differently: Take 300-600 mg by mouth 3 (three) times daily. One in am, one in evening and two po at night 09/02/14  Yes Asa Lente, MD  metoprolol succinate (TOPROL-XL) 25 MG 24 hr tablet Take 1 tablet (25 mg total) by mouth daily. 06/17/14  Yes Wendall StadePeter C Nishan, MD  metroNIDAZOLE (FLAGYL) 500 MG tablet Take 1 tablet (500 mg total) by mouth every 8 (eight) hours. For 4days 02/09/15  Yes Zannie CovePreetha Joseph, MD  mometasone-formoterol [facility](DULERA) 200-5 MCG/ACT AERO Inhale 2 puffs into the lungs 2 (two) times daily.   Yes Historical Provider, MD  montelukast (SINGULAIR) 10 MG tablet Take 10 mg by mouth daily.    Yes Historical Provider, MD  Multiple Vitamin  (MULTIVITAMIN WITH MINERALS) TABS tablet Take 1 tablet by mouth daily. 07/19/14  Yes Belkys A Regalado, MD  oxyCODONE (OXY IR/ROXICODONE) 5 MG immediate release tablet Take 1 tablet (5 mg total) by mouth every 6 (six) hours as needed for severe pain. 02/09/15  Yes Zannie CovePreetha Joseph, MD  potassium chloride SA (K-DUR,KLOR-CON) 20 MEQ tablet Take 1 tablet (20 mEq total) by mouth daily. Take while on Lasix 02/09/15  Yes Zannie CovePreetha Joseph, MD  furosemide (LASIX) 20 MG tablet Take 1 tablet (20 mg total) by mouth 2 (two) times daily. For 3-4days, for leg swelling Patient not taking: Reported on 02/09/2015 02/09/15   Zannie CovePreetha Joseph, MD  ondansetron (ZOFRAN) 4 MG tablet Take 1 tablet (4 mg total) by mouth every 8 (eight) hours as needed for nausea or vomiting. Patient not taking: Reported on 02/09/2015 02/09/15   Zannie CovePreetha Joseph, MD  polyethylene glycol [facility](MIRALAX / Ethelene HalGLYCOLAX) packet Take 17 g by mouth daily as needed. Patient not taking: Reported on 10/29/2014 07/19/14   Belkys A Regalado, MD  warfarin (COUMADIN) 5 MG tablet Take 1 tablet (5 mg total) by mouth daily. Dose may be adjusted based on INR check Wednesday Patient not taking: Reported on 02/09/2015 02/09/15   Zannie CovePreetha Joseph, MD     Allergies  Allergen Reactions  . Peanuts [Peanut Oil] Itching    Sensitivity     Social History:  reports that she quit smoking about 2 years ago. Her smoking use included Cigarettes. She has a 1 pack-year smoking history. She has never used smokeless tobacco. She reports that she drinks about 1.8 oz of alcohol per week. She reports that she does not use illicit drugs.    Family History  Problem Relation Age of Onset  . Heart failure Mother   . Heart failure Father   . Heart failure Maternal Grandfather        Physical Exam:  GEN:  Pleasant Obese 29 y.o. Caucasian female examined and in Discomfort but no acute distress; cooperative with exam Filed Vitals:   02/09/15 1951 02/09/15 2008 02/09/15 2329  BP: 133/87  102/51 136/92  Pulse: 100 101 106  Temp: 98.5 F (36.9 C) 98.6 F (37 C)   TempSrc: Oral Oral   Resp: 20 24 21   SpO2: 94% 96% 95%   Blood pressure 136/92, pulse 106, temperature 98.6 F (37 C), temperature source Oral, resp. rate 21, last menstrual period 12/30/2014, SpO2 95 %. PSYCH: She is alert and oriented x4; does not appear anxious does not appear depressed; affect is normal HEENT: Normocephalic and Atraumatic, Mucous membranes pink; PERRLA; EOM intact; Fundi:  Benign;  No scleral icterus, Nares: Patent, Oropharynx: Clear, Fair Dentition,    Neck:  FROM, No Cervical Lymphadenopathy nor Thyromegaly or Carotid Bruit; No JVD; Breasts:: Not examined CHEST WALL: No tenderness CHEST: Normal respiration, clear to auscultation bilaterally HEART: Regular rate and rhythm; no murmurs rubs or gallops BACK: No kyphosis or scoliosis; No CVA tenderness ABDOMEN: Positive Bowel Sounds, Obese, Soft, + suprapubic tenderness,  No Rebound or Guarding; No Masses, No Organomegaly Rectal Exam:  Not done EXTREMITIES: No Cyanosis, Clubbing, 2+ BLE Edema; No Ulcerations. Genitalia: not examined PULSES: 2+ and symmetric SKIN: Normal hydration no rash or ulceration CNS:  Alert and Oriented x 4, No Focal Deficits Vascular: pulses palpable throughout    Labs on Admission:  Basic Metabolic Panel:  Recent Labs Lab 02/03/15 0525 02/04/15 0805  02/06/15 0510 02/07/15 0540 02/08/15 0550 02/09/15 0512 02/09/15 2040  NA 136 137  < > 137 139 141 139 136  K 2.8* 2.9*  < > 3.2* 3.2* 3.8 4.5 4.4  CL 101 104  < > 104 104 107 103 101  CO2 25 26  < > GLUCOSE 115* 111*  < > 104* 116* 110* 115* 130*  BUN <5* <5*  < > <5* <5* <5* <5* <5*  CREATININE 0.49 0.56  < > 0.51 0.53 0.59 0.57 0.50  CALCIUM 8.0* 8.2*  < > 8.3* 8.4* 8.4* 8.6* 8.7*  MG 1.8 2.0  --   --   --   --   --   --   < > = values in this interval not displayed. Liver Function Tests:  Recent Labs Lab 02/06/15 0510  02/07/15 0540 02/08/15 0550 02/09/15 0512 02/09/15 2040  AST 70* 68* 74* 53* 50*  ALT 35 33 ALKPHOS 210* 215* 217* 198* 200*  BILITOT 1.5* 1.5* 1.3* 1.1 1.6*  PROT 5.7* 6.0* 5.8* 6.0* 6.3*  ALBUMIN 2.3* 2.3* 2.3* 2.4* 2.5*    Recent Labs Lab 02/03/15 0525 02/09/15 2040  LIPASE 60* 43   No results for input(s): AMMONIA in the last 168 hours. CBC:  Recent Labs Lab 02/03/15 0525  02/06/15 0510 02/07/15 0540 02/08/15 0550 02/09/15 0512 02/09/15 2040  WBC 11.7*  < > 16.5* 14.6* 12.3* 11.2* 15.7*  NEUTROABS 6.6  --   --   --   --   --  13.4*  HGB 11.3*  < > 11.5* 11.7* 11.0* 11.4* 11.7*  HCT 34.1*  < > 34.4* 36.0 34.1* 35.5* 36.3  MCV 96.9  < > 99.1 99.2 99.4 100.6* 98.9  PLT 193  < > 344 383 357 363 412*  < > = values in this interval not displayed. Cardiac Enzymes: No results for input(s): CKTOTAL, CKMB, CKMBINDEX, TROPONINI in the last 168 hours.  BNP (last 3 results)  Recent Labs  07/06/14 1255  BNP 79.2    ProBNP (last 3 results) No results for input(s): PROBNP in the last 8760 hours.  CBG: No results for input(s): GLUCAP in the last 168 hours.  Radiological Exams on Admission: US Transvaginal Non-ob  02/09/2015  ADDENDUM REPORT: 02/09/2015 23:43 ADDENDUM: The Doppler findings should read: Pulsed Doppler evaluation of the right ovary demonstrates normal low-resistance arterial and venous waveforms. The left ovary is not visualized on this study. Subsequent CT demonstrates a grossly unremarkable appearance to both ovaries. Electronically Signed   By: Roanna Raider M.D.   On: 02/09/2015 23:43  02/09/2015  CLINICAL DATA:  Acute onset of pelvic pain.  Initial encounter. EXAM: TRANSABDOMINAL AND TRANSVAGINAL ULTRASOUND OF PELVIS DOPPLER ULTRASOUND OF OVARIES TECHNIQUE: Both transabdominal and transvaginal ultrasound examinations of the pelvis were performed. Transabdominal technique was performed for global imaging of the pelvis including uterus,  ovaries, adnexal regions, and pelvic cul-de-sac. It was necessary to proceed with endovaginal exam following the transabdominal exam to visualize the uterus and ovaries in greater detail. Color and duplex Doppler ultrasound was utilized to evaluate blood flow to the  ovaries. COMPARISON:  CT of the abdomen and pelvis from 02/01/2015 FINDINGS: Uterus Measurements: 4.6 x 2.0 x 2.6 cm. No fibroids or other mass visualized. Endometrium Thickness: 0.2 cm.  No focal abnormality visualized. Right ovary Measurements: 2.9 x 2.2 x 2.7 cm. Normal appearance/no adnexal mass. Left ovary Not visualized. Pulsed Doppler evaluation of both ovaries demonstrates normal low-resistance arterial and venous waveforms. Other findings A small amount of free fluid is seen within the pelvic cul-de-sac. IMPRESSION: Unremarkable pelvic ultrasound. No evidence for ovarian torsion. The left ovary is not visualized on this study. Electronically Signed: By: Roanna Raider M.D. On: 02/09/2015 23:08   US Pelvis Complete  02/09/2015  ADDENDUM REPORT: 02/09/2015 23:43 ADDENDUM: The Doppler findings should read: Pulsed Doppler evaluation of the right ovary demonstrates normal low-resistance arterial and venous waveforms. The left ovary is not visualized on this study. Subsequent CT demonstrates a grossly unremarkable appearance to both ovaries. Electronically Signed   By: Roanna Raider M.D.   On: 02/09/2015 23:43  02/09/2015  CLINICAL DATA:  Acute onset of pelvic pain.  Initial encounter. EXAM: TRANSABDOMINAL AND TRANSVAGINAL ULTRASOUND OF PELVIS DOPPLER ULTRASOUND OF OVARIES TECHNIQUE: Both transabdominal and transvaginal ultrasound examinations of the pelvis were performed. Transabdominal technique was performed for global imaging of the pelvis including uterus, ovaries, adnexal regions, and pelvic cul-de-sac. It was necessary to proceed with endovaginal exam following the transabdominal exam to visualize the uterus and ovaries in greater  detail. Color and duplex Doppler ultrasound was utilized to evaluate blood flow to the ovaries. COMPARISON:  CT of the abdomen and pelvis from 02/01/2015 FINDINGS: Uterus Measurements: 4.6 x 2.0 x 2.6 cm. No fibroids or other mass visualized. Endometrium Thickness: 0.2 cm.  No focal abnormality visualized. Right ovary Measurements: 2.9 x 2.2 x 2.7 cm. Normal appearance/no adnexal mass. Left ovary Not visualized. Pulsed Doppler evaluation of both ovaries demonstrates normal low-resistance arterial and venous waveforms. Other findings A small amount of free fluid is seen within the pelvic cul-de-sac. IMPRESSION: Unremarkable pelvic ultrasound. No evidence for ovarian torsion. The left ovary is not visualized on this study. Electronically Signed: By: Roanna Raider M.D. On: 02/09/2015 23:08   [place] Art/ven Flow Abd Pelv Doppler  02/09/2015  ADDENDUM REPORT: 02/09/2015 23:43 ADDENDUM: The Doppler findings should read: Pulsed Doppler evaluation of the right ovary demonstrates normal low-resistance arterial and venous waveforms. The left ovary is not visualized on this study. Subsequent CT demonstrates a grossly unremarkable appearance to both ovaries. Electronically Signed   By: Roanna Raider M.D.   On: 02/09/2015 23:43  02/09/2015  CLINICAL DATA:  Acute onset of pelvic pain.  Initial encounter. EXAM: TRANSABDOMINAL AND TRANSVAGINAL ULTRASOUND OF PELVIS DOPPLER ULTRASOUND OF OVARIES TECHNIQUE: Both transabdominal and transvaginal ultrasound examinations of the pelvis were performed. Transabdominal technique was performed for global imaging of the pelvis including uterus, ovaries, adnexal regions, and pelvic cul-de-sac. It was necessary to proceed with endovaginal exam following the transabdominal exam to visualize the uterus and ovaries in greater detail. Color and duplex Doppler ultrasound was utilized to evaluate blood flow to the ovaries. COMPARISON:  CT of the abdomen and pelvis from 02/01/2015 FINDINGS: Uterus  Measurements: 4.6 x 2.0 x 2.6 cm. No fibroids or other mass visualized. Endometrium Thickness: 0.2 cm.  No focal abnormality visualized. Right ovary Measurements: 2.9 x 2.2 x 2.7 cm. Normal appearance/no adnexal mass. Left ovary Not visualized. Pulsed Doppler evaluation of both ovaries demonstrates normal low-resistance arterial and venous waveforms. Other findings A small amount of free fluid  is seen within the pelvic cul-de-sac. IMPRESSION: Unremarkable pelvic ultrasound. No evidence for ovarian torsion. The left ovary is not visualized on this study. Electronically Signed: By: Roanna Raider M.D. On: 02/09/2015 23:08   Dg Chest Portable 1 View  02/09/2015  CLINICAL DATA:  29 year old female with severe lower abdominal pain and shortness of breath. EXAM: PORTABLE CHEST 1 VIEW COMPARISON:  Chest x-ray 01/31/2015. FINDINGS: Small bilateral pleural effusions (left greater than right). Bibasilar opacities favored to predominantly reflect subsegmental atelectasis (underlying airspace consolidation in the left lower lobe is not excluded). Mid to upper lungs are clear. Pulmonary venous congestion, without frank pulmonary edema. Heart size appears borderline enlarged. Upper mediastinal contours are slightly distorted by patient positioning. IMPRESSION: 1. Small bilateral pleural effusions (left greater than right) with probable bibasilar subsegmental atelectasis. 2. Borderline cardiomegaly with mild pulmonary venous congestion, but no frank pulmonary edema. Electronically Signed   By: Trudie Reed M.D.   On: 02/09/2015 21:10     EKG: Independently reviewed.    Assessment/Plan:     29 y.o. female with  Active Problems:   1.    Intra-abdominal hematoma   General Surgery consulted: Dr Daphine Deutscher to see in AM   Reversing Coumadin    FFP and Vitamin K ordered    Monitor PT/INR     2.    ABD Pain- due to #1   Pain Control PRN with IV Dilaudid     3.    Coagulopathy due to Coumadin Rx   FFP and Vitamin K  Ordered   Monitor PT/INR     4.    Splenic Vein Thrombosis   Anticoagulation on Hold for now due to #1     5.    Resolving Alcoholic Pancreatitis   Monitor Lipase level    Improved overall Lipase = 43 on admission     6.    Hypoalbuminemia- Albumin = 2.5, due to recent illness and poor PO intake   Nutrition consult for Caloric needs       7.    BLE Edema- due to # 6       8.    Leukocytosis-  Stress Rxn   Monitor Trend     9.    DVT Prophylaxis   SCDs         Code Status:     FULL CODE   Family Communication:   Family at Bedside     Disposition Plan:    Inpatient  Status        Time spent:  85 Minutes      Ron Parker [facility] Pager [phone]   If 7AM -7PM Please Contact the Day Rounding Team MD for [facility]  If 7PM-7AM, Please Contact Night-Floor Coverage  [email or web address] Password [email or web address] 02/09/2015, 11:56 PM     ADDENDUM:   Patient was seen and examined on 02/09/2015

## 2015-02-09 NOTE — ED Notes (Signed)
Upon EMS arrival patient was in the bed moaning in pain. When they pulled the covers back, they noted BLE swelling. Family that was at bedside, noted that swelling was new. EMS reports that she had N/V prior to EMS arrival.  Pt presents with pelvic pain, "lower abdominal pain" and lower back pain.  Today, she was discharged for pancreatitis. Pain began in lower abdomen around noon as she was getting home and pelvic pain around 2p until now. Per pt, when she was discharged, she was told she had a blood clot in her "pivot vein"  and "cyst". She picked up her coumadin prescripton but has not taken medication. She has taken prescription for pain. VS in route: BP: 166/138 P: 100 RR: 20 SpO2: 91-96%

## 2015-02-09 NOTE — ED Notes (Signed)
X-ray tech is at bedside.

## 2015-02-09 NOTE — ED Notes (Signed)
Ultrasound tech at bedside

## 2015-02-10 ENCOUNTER — Inpatient Hospital Stay (HOSPITAL_COMMUNITY): Payer: BC Managed Care – PPO

## 2015-02-10 ENCOUNTER — Inpatient Hospital Stay (HOSPITAL_COMMUNITY)
Admit: 2015-02-10 | Discharge: 2015-02-10 | Disposition: A | Discharge status: Home / Self Care | Payer: BC Managed Care – PPO | Attending: Interventional Radiology | Admitting: Interventional Radiology

## 2015-02-10 ENCOUNTER — Other Ambulatory Visit: Payer: Self-pay | Admitting: Interventional Radiology

## 2015-02-10 DIAGNOSIS — S3690XA Unspecified injury of unspecified intra-abdominal organ, initial encounter: Secondary | ICD-10-CM

## 2015-02-10 DIAGNOSIS — E8809 Other disorders of plasma-protein metabolism, not elsewhere classified: Secondary | ICD-10-CM | POA: Diagnosis present

## 2015-02-10 DIAGNOSIS — K859 Acute pancreatitis, unspecified: Secondary | ICD-10-CM

## 2015-02-10 DIAGNOSIS — S301XXA Contusion of abdominal wall, initial encounter: Secondary | ICD-10-CM | POA: Diagnosis present

## 2015-02-10 DIAGNOSIS — E46 Unspecified protein-calorie malnutrition: Secondary | ICD-10-CM

## 2015-02-10 DIAGNOSIS — R109 Unspecified abdominal pain: Secondary | ICD-10-CM

## 2015-02-10 DIAGNOSIS — I10 Essential (primary) hypertension: Secondary | ICD-10-CM | POA: Diagnosis present

## 2015-02-10 DIAGNOSIS — D62 Acute posthemorrhagic anemia: Secondary | ICD-10-CM | POA: Insufficient documentation

## 2015-02-10 DIAGNOSIS — I5032 Chronic diastolic (congestive) heart failure: Secondary | ICD-10-CM

## 2015-02-10 DIAGNOSIS — E43 Unspecified severe protein-calorie malnutrition: Secondary | ICD-10-CM

## 2015-02-10 DIAGNOSIS — T45515A Adverse effect of anticoagulants, initial encounter: Secondary | ICD-10-CM

## 2015-02-10 DIAGNOSIS — K852 Alcohol induced acute pancreatitis without necrosis or infection: Secondary | ICD-10-CM | POA: Diagnosis present

## 2015-02-10 DIAGNOSIS — D72829 Elevated white blood cell count, unspecified: Secondary | ICD-10-CM

## 2015-02-10 DIAGNOSIS — D6832 Hemorrhagic disorder due to extrinsic circulating anticoagulants: Secondary | ICD-10-CM | POA: Diagnosis present

## 2015-02-10 LAB — PROTHROMBIN GENE MUTATION

## 2015-02-10 LAB — BASIC METABOLIC PANEL
Anion gap: 11 (ref 5–15)
CHLORIDE: 102 mmol/L (ref 101–111)
CO2: 22 mmol/L (ref 22–32)
Calcium: 8.2 mg/dL — ABNORMAL LOW (ref 8.9–10.3)
Creatinine, Ser: 0.56 mg/dL (ref 0.44–1.00)
GFR calc Af Amer: >60 mL/min (ref >60)
GFR calc non Af Amer: >60 mL/min (ref >60)
Glucose, Bld: 142 mg/dL — ABNORMAL HIGH (ref 65–99)
POTASSIUM: 4.2 mmol/L (ref 3.5–5.1)
SODIUM: 135 mmol/L (ref 135–145)

## 2015-02-10 LAB — PROTIME-INR
INR: 2.17 — ABNORMAL HIGH (ref 0.00–1.49)
INR: 1.4 (ref 0.00–1.49)
PROTHROMBIN TIME: 17.3 s — AB (ref 11.6–15.2)
Prothrombin Time: 24 seconds — ABNORMAL HIGH (ref 11.6–15.2)

## 2015-02-10 LAB — CBC
HEMATOCRIT: 34.1 % — AB (ref 36.0–46.0)
Hemoglobin: 11.2 g/dL — ABNORMAL LOW (ref 12.0–15.0)
MCH: 32.3 pg (ref 26.0–34.0)
MCHC: 32.8 g/dL (ref 30.0–36.0)
MCV: 98.3 fL (ref 78.0–100.0)
PLATELETS: 403 10*3/uL — AB (ref 150–400)
RBC: 3.47 MIL/uL — ABNORMAL LOW (ref 3.87–5.11)
RDW: 14.4 % (ref 11.5–15.5)
WBC: 17 10*3/uL — AB (ref 4.0–10.5)

## 2015-02-10 LAB — HEMOGLOBIN AND HEMATOCRIT, BLOOD
HCT: 17.3 % — ABNORMAL LOW (ref 36.0–46.0)
Hemoglobin: 5.7 g/dL — CL (ref 12.0–15.0)

## 2015-02-10 LAB — FACTOR 5 LEIDEN

## 2015-02-10 LAB — GC/CHLAMYDIA PROBE AMP (~~LOC~~) NOT AT ARMC
CHLAMYDIA, DNA PROBE: NEGATIVE
Neisseria Gonorrhea: NEGATIVE

## 2015-02-10 LAB — MRSA PCR SCREENING: MRSA BY PCR: NEGATIVE

## 2015-02-10 LAB — ABO/RH: ABO/RH(D): O NEG

## 2015-02-10 LAB — PREPARE RBC (CROSSMATCH)

## 2015-02-10 LAB — I-STAT BETA HCG BLOOD, ED (NOT ORDERABLE)

## 2015-02-10 MED ORDER — LIDOCAINE HCL (PF) 1 % IJ SOLN
INTRAMUSCULAR | Status: AC
  Filled 2015-02-10: qty 5

## 2015-02-10 MED ORDER — CEFUROXIME AXETIL 500 MG PO TABS
500.0000 mg | ORAL_TABLET | Freq: Two times a day (BID) | ORAL | Status: DC
  Administered 2015-02-10: 500 mg via ORAL
  Filled 2015-02-10 (×4): qty 1

## 2015-02-10 MED ORDER — SODIUM CHLORIDE 0.9 % IV BOLUS (SEPSIS)
500.0000 mL | Freq: Once | INTRAVENOUS | Status: AC
  Administered 2015-02-10: 500 mL via INTRAVENOUS

## 2015-02-10 MED ORDER — SODIUM CHLORIDE 0.9 % IV SOLN
500.0000 mg | Freq: Four times a day (QID) | INTRAVENOUS | Status: DC
  Administered 2015-02-10 – 2015-02-17 (×29): 500 mg via INTRAVENOUS
  Filled 2015-02-10 (×29): qty 500

## 2015-02-10 MED ORDER — METOPROLOL TARTRATE 1 MG/ML IV SOLN
INTRAVENOUS | Status: AC
  Filled 2015-02-10: qty 5

## 2015-02-10 MED ORDER — ACETAMINOPHEN 650 MG RE SUPP: 650.0000 mg | Freq: Four times a day (QID) | RECTAL | Status: DC | PRN

## 2015-02-10 MED ORDER — ONDANSETRON HCL 4 MG PO TABS: 4.0000 mg | ORAL_TABLET | Freq: Four times a day (QID) | ORAL | Status: DC | PRN

## 2015-02-10 MED ORDER — MIDAZOLAM HCL 2 MG/2ML IJ SOLN
INTRAMUSCULAR | Status: AC | PRN
  Administered 2015-02-10 (×4): 0.5 mg via INTRAVENOUS

## 2015-02-10 MED ORDER — METOPROLOL TARTRATE 1 MG/ML IV SOLN
2.5000 mg | Freq: Once | INTRAVENOUS | Status: AC
  Administered 2015-02-10: 2.5 mg via INTRAVENOUS

## 2015-02-10 MED ORDER — MONTELUKAST SODIUM 10 MG PO TABS
10.0000 mg | ORAL_TABLET | Freq: Every day | ORAL | Status: DC
  Administered 2015-02-10 – 2015-02-21 (×12): 10 mg via ORAL
  Filled 2015-02-10 (×12): qty 1

## 2015-02-10 MED ORDER — METRONIDAZOLE 500 MG PO TABS
500.0000 mg | ORAL_TABLET | Freq: Three times a day (TID) | ORAL | Status: DC
  Administered 2015-02-10 (×2): 500 mg via ORAL
  Filled 2015-02-10 (×2): qty 1

## 2015-02-10 MED ORDER — GABAPENTIN 300 MG PO CAPS
300.0000 mg | ORAL_CAPSULE | Freq: Two times a day (BID) | ORAL | Status: DC
  Administered 2015-02-10 – 2015-02-21 (×23): 300 mg via ORAL
  Filled 2015-02-10 (×27): qty 1

## 2015-02-10 MED ORDER — PANTOPRAZOLE SODIUM 40 MG PO TBEC
40.0000 mg | DELAYED_RELEASE_TABLET | Freq: Every day | ORAL | Status: DC
  Administered 2015-02-10 – 2015-02-21 (×12): 40 mg via ORAL
  Filled 2015-02-10 (×12): qty 1

## 2015-02-10 MED ORDER — METOPROLOL SUCCINATE ER 25 MG PO TB24: 25.0000 mg | ORAL_TABLET | Freq: Every day | ORAL | Status: DC

## 2015-02-10 MED ORDER — ADULT MULTIVITAMIN W/MINERALS CH
1.0000 | ORAL_TABLET | Freq: Every day | ORAL | Status: DC
  Administered 2015-02-10 – 2015-02-21 (×12): 1 via ORAL
  Filled 2015-02-10 (×12): qty 1

## 2015-02-10 MED ORDER — DIPHENHYDRAMINE HCL 25 MG PO CAPS
25.0000 mg | ORAL_CAPSULE | Freq: Once | ORAL | Status: AC
  Administered 2015-02-10: 25 mg via ORAL
  Filled 2015-02-10: qty 1

## 2015-02-10 MED ORDER — FENTANYL CITRATE (PF) 100 MCG/2ML IJ SOLN
INTRAMUSCULAR | Status: AC
  Filled 2015-02-10: qty 2

## 2015-02-10 MED ORDER — ACETAMINOPHEN 325 MG PO TABS
650.0000 mg | ORAL_TABLET | Freq: Four times a day (QID) | ORAL | Status: DC | PRN
  Administered 2015-02-10 – 2015-02-21 (×14): 650 mg via ORAL
  Filled 2015-02-10 (×14): qty 2

## 2015-02-10 MED ORDER — LORATADINE 10 MG PO TABS
10.0000 mg | ORAL_TABLET | Freq: Every day | ORAL | Status: DC
  Administered 2015-02-10 – 2015-02-21 (×12): 10 mg via ORAL
  Filled 2015-02-10 (×12): qty 1

## 2015-02-10 MED ORDER — SODIUM CHLORIDE 0.9 % IV SOLN
Freq: Once | INTRAVENOUS | Status: AC
  Administered 2015-02-10: 16:00:00 via INTRAVENOUS

## 2015-02-10 MED ORDER — HYDROMORPHONE HCL 1 MG/ML IJ SOLN
0.5000 mg | INTRAMUSCULAR | Status: DC | PRN
  Administered 2015-02-10 (×2): 1 mg via INTRAVENOUS
  Filled 2015-02-10 (×2): qty 1

## 2015-02-10 MED ORDER — SODIUM CHLORIDE 0.9 % IV SOLN
100.0000 mg | INTRAVENOUS | Status: DC
  Administered 2015-02-11 – 2015-02-16 (×6): 100 mg via INTRAVENOUS
  Filled 2015-02-10 (×7): qty 100

## 2015-02-10 MED ORDER — SODIUM CHLORIDE 0.9 % IV BOLUS (SEPSIS)
1000.0000 mL | Freq: Once | INTRAVENOUS | Status: AC
  Administered 2015-02-10: 1000 mL via INTRAVENOUS

## 2015-02-10 MED ORDER — IOHEXOL 300 MG/ML  SOLN
194.0000 mL | Freq: Once | INTRAMUSCULAR | Status: DC | PRN
  Administered 2015-02-10: 194 mL via INTRA_ARTERIAL
  Filled 2015-02-10: qty 200

## 2015-02-10 MED ORDER — FENTANYL CITRATE (PF) 100 MCG/2ML IJ SOLN
INTRAMUSCULAR | Status: AC | PRN
  Administered 2015-02-10 (×2): 25 ug via INTRAVENOUS

## 2015-02-10 MED ORDER — ONDANSETRON HCL 4 MG/2ML IJ SOLN
4.0000 mg | Freq: Four times a day (QID) | INTRAMUSCULAR | Status: DC | PRN
  Administered 2015-02-10: 4 mg via INTRAVENOUS
  Filled 2015-02-10: qty 2

## 2015-02-10 MED ORDER — FENTANYL CITRATE (PF) 100 MCG/2ML IJ SOLN
12.5000 ug | INTRAMUSCULAR | Status: DC | PRN
  Administered 2015-02-10 (×4): 25 ug via INTRAVENOUS
  Filled 2015-02-10 (×4): qty 2

## 2015-02-10 MED ORDER — HYDROMORPHONE HCL 1 MG/ML IJ SOLN
0.5000 mg | INTRAMUSCULAR | Status: DC | PRN
  Administered 2015-02-10 – 2015-02-18 (×56): 1 mg via INTRAVENOUS
  Filled 2015-02-10 (×57): qty 1

## 2015-02-10 MED ORDER — SODIUM CHLORIDE 0.9 % IV SOLN
200.0000 mg | Freq: Once | INTRAVENOUS | Status: AC
  Administered 2015-02-10: 200 mg via INTRAVENOUS
  Filled 2015-02-10: qty 200

## 2015-02-10 MED ORDER — ALUM & MAG HYDROXIDE-SIMETH 200-200-20 MG/5ML PO SUSP: 30.0000 mL | Freq: Four times a day (QID) | ORAL | Status: DC | PRN

## 2015-02-10 MED ORDER — DEXTROSE 5 % IV SOLN
30.0000 ug/min | INTRAVENOUS | Status: DC
  Filled 2015-02-10: qty 1

## 2015-02-10 MED ORDER — GABAPENTIN 300 MG PO CAPS
600.0000 mg | ORAL_CAPSULE | Freq: Every day | ORAL | Status: DC
  Administered 2015-02-10 – 2015-02-20 (×12): 600 mg via ORAL
  Filled 2015-02-10 (×13): qty 2

## 2015-02-10 MED ORDER — FENTANYL CITRATE (PF) 100 MCG/2ML IJ SOLN
INTRAMUSCULAR | Status: AC
  Filled 2015-02-10: qty 4

## 2015-02-10 MED ORDER — LIDOCAINE HCL 1 % IJ SOLN
INTRAMUSCULAR | Status: AC
  Filled 2015-02-10: qty 20

## 2015-02-10 MED ORDER — OXYCODONE HCL 5 MG PO TABS: 5.0000 mg | ORAL_TABLET | ORAL | Status: DC | PRN

## 2015-02-10 MED ORDER — FENTANYL CITRATE (PF) 100 MCG/2ML IJ SOLN
12.5000 ug | Freq: Once | INTRAMUSCULAR | Status: AC
  Administered 2015-02-10: 12.5 ug via INTRAVENOUS

## 2015-02-10 MED ORDER — SODIUM CHLORIDE 0.9 % IV SOLN
INTRAVENOUS | Status: DC
  Administered 2015-02-10 – 2015-02-11 (×4): via INTRAVENOUS

## 2015-02-10 MED ORDER — MIDAZOLAM HCL 2 MG/2ML IJ SOLN
INTRAMUSCULAR | Status: AC
  Filled 2015-02-10: qty 6

## 2015-02-10 MED ORDER — ANIDULAFUNGIN 100 MG IV SOLR: 100.0000 mg | INTRAVENOUS | Status: DC

## 2015-02-10 MED ORDER — LACTATED RINGERS IV SOLN
INTRAVENOUS | Status: DC
  Administered 2015-02-10: 08:00:00 via INTRAVENOUS

## 2015-02-10 NOTE — Sedation Documentation (Signed)
5 Fr sheath removed from R femoral artery by Dr. Deanne CofferHassell.  Hemostasis achieved using Exoseal closure device.  R groin level 0, 1+RDP.

## 2015-02-10 NOTE — Progress Notes (Signed)
CRITICAL VALUE ALERT  Critical value received:  hgb - 5.4  Date of notification: 02/10/15  Time of notification:  12:00  Critical value read back:Yes.    Nurse who received alert:  Dallas BreedingPam West, RN  MD notified (1st page):  Canary BrimBrandi Ollis, ACNP  Time of first page:  12:04 pm  MD notified (2nd page):  Time of second page:  Responding MD:  Canary BrimBrandi Ollis, NP  Time MD responded:  12:05 pm

## 2015-02-10 NOTE — Progress Notes (Signed)
Nutrition Brief Note  RD consulted for nutrition assessment.  Patient was not present in room at this time. Pt having a procedure: visceral/mesenteric arteriogram with possible embolization per radiology note.   RD to follow-up to complete assessment on 10/25.  Wt Readings from Last 15 Encounters:  02/10/15 221 lb 5.5 oz (100.4 kg)  02/08/15 229 lb 11.5 oz (104.2 kg)  02/03/15 233 lb (105.688 kg)  10/29/14 206 lb (93.441 kg)  09/02/14 206 lb 12.8 oz (93.804 kg)  08/06/14 208 lb (94.348 kg)  08/02/14 208 lb (94.348 kg)  07/16/14 208 lb 5.4 oz (94.5 kg)  07/12/14 209 lb (94.802 kg)  07/06/14 209 lb 14.1 oz (95.2 kg)  06/11/14 218 lb 12.8 oz (99.247 kg)    Body mass index is 36.83 kg/(m^2). Patient meets criteria for obesity based on current BMI.   Current diet order is clear liquids. Labs and medications reviewed.    Kara FrancoLindsey Maribel Luis, MS, RD, LDN Pager: [phone] After Hours Pager: [phone]

## 2015-02-10 NOTE — Procedures (Signed)
Central Venous Catheter Insertion Procedure Note Kara [name] [id number] 04/19/1985  Procedure: Insertion of Central Venous Catheter Indications: Assessment of intravascular volume, Drug and/or fluid administration and Frequent blood sampling  Procedure Details Consent: Risks of procedure as well as the alternatives and risks of each were explained to the (patient/caregiver).  Consent for procedure obtained. Time Out: Verified patient identification, verified procedure, site/side was marked, verified correct patient position, special equipment/implants available, medications/allergies/relevent history reviewed, required imaging and test results available.  Performed  Maximum sterile technique was used including antiseptics, cap, gloves, gown, hand hygiene, mask and sheet. Skin prep: Chlorhexidine; local anesthetic administered A antimicrobial bonded/coated triple lumen catheter was placed in the left internal jugular vein to 18 cm using the Seldinger technique.  Evaluation Blood flow good Complications: No apparent complications Patient did tolerate procedure well. Chest X-ray ordered to verify placement.  CXR: pending.   Procedure performed under direct supervision of Dr. Isaiah SergeMannam and with ultrasound guidance for real time vessel cannulation.      Canary BrimBrandi Colletta Spillers, NP-C [facility] Pgr: [phone] or if no answer [phone] 02/10/2015, 11:04 AM

## 2015-02-10 NOTE — Procedures (Signed)
Mesenteric arteriogram, subselective coil embo of IMA branches x2 No complication No blood loss. [facility].

## 2015-02-10 NOTE — Progress Notes (Signed)
eLink Physician-Brief Progress Note Patient Name: Kara [name] DOB: 05/01/1985 MRN: [id number]   Date of Service  02/10/2015  HPI/Events of Note  Two issues: 1) Pain, fentanyl not helping 2) Fever after multiple blood products (5 FFP); currently not bleeding  eICU Interventions  Hold FFP Change fentanyl to dilaudid     Intervention Category Intermediate Interventions: Pain - evaluation and management;Coagulopathy - evaluation and management  Max FickleDouglas McQuaid 02/10/2015, 11:31 PM

## 2015-02-10 NOTE — Progress Notes (Signed)
ANTIBIOTIC CONSULT NOTE - INITIAL  Pharmacy Consult for Primaxin Indication: Sepsis from [facility]  Allergies  Allergen Reactions  . Peanuts [Peanut Oil] Itching    Sensitivity     Patient Measurements: Height: 5\' 5"  (165.1 cm) Weight: 221 lb 5.5 oz (100.4 kg) IBW/kg (Calculated) : 57  Vital Signs: Temp: 99.4 F (37.4 C) (10/24 0940) Temp Source: Oral (10/24 0940) BP: 105/64 mmHg (10/24 1200) Pulse Rate: 139 (10/24 1200) Intake/Output from previous day: 10/23 0701 - 10/24 0700 In: 602 [I.V.:50; Blood:552] Out: -  Intake/Output from this shift: Total I/O In: 2039.2 [P.O.:100; I.V.:339.2; Blood:600; IV Piggyback:1000] Out: -   Labs:  Recent Labs  02/09/15 0512 02/09/15 2040 02/10/15 0157  WBC 11.2* 15.7* 17.0*  HGB 11.4* 11.7* 11.2*  PLT 363 412* 403*  CREATININE 0.57 0.50 0.56   Estimated Creatinine Clearance: 120.8 mL/min (by C-G formula based on Cr of 0.56). No results for input(s): VANCOTROUGH, VANCOPEAK, VANCORANDOM, GENTTROUGH, GENTPEAK, GENTRANDOM, TOBRATROUGH, TOBRAPEAK, TOBRARND, AMIKACINPEAK, AMIKACINTROU, AMIKACIN in the last 72 hours.   Microbiology: Recent Results (from the past 720 hour(s))  Culture, blood (routine x 2)     Status: None   Collection Time: 01/31/15 12:17 PM  Result Value Ref Range Status   Specimen Description BLOOD RIGHT ANTECUBITAL  Final   Special Requests BOTTLES DRAWN AEROBIC AND ANAEROBIC 10CC  Final   Culture   Final    NO GROWTH 5 DAYS [facility]    Report Status 02/05/2015 FINAL  Final  Culture, blood (routine x 2)     Status: None   Collection Time: 01/31/15 12:17 PM  Result Value Ref Range Status   Specimen Description BLOOD RIGHT HAND  Final   Special Requests BOTTLES DRAWN AEROBIC AND ANAEROBIC 5CC  Final   Culture   Final    NO GROWTH 5 DAYS [facility]    Report Status 02/05/2015 FINAL  Final  Culture, Urine     Status: None   Collection Time: 02/03/15  3:47 AM   Result Value Ref Range Status   Specimen Description URINE, CLEAN CATCH  Final   Special Requests NONE  Final   Culture   Final    NO GROWTH 1 DAY [facility]    Report Status 02/04/2015 FINAL  Final  Wet prep, genital     Status: None   Collection Time: 02/09/15  9:13 PM  Result Value Ref Range Status   Yeast Wet Prep HPF POC NONE SEEN NONE SEEN Final   Trich, Wet Prep NONE SEEN NONE SEEN Final   Clue Cells Wet Prep HPF POC NONE SEEN NONE SEEN Final   WBC, Wet Prep HPF POC NONE SEEN NONE SEEN Final  MRSA PCR Screening     Status: None   Collection Time: 02/10/15  1:04 AM  Result Value Ref Range Status   MRSA by PCR NEGATIVE NEGATIVE Final    Comment:        The GeneXpert MRSA Assay (FDA approved for NASAL specimens only), is one component of a comprehensive MRSA colonization surveillance program. It is not intended to diagnose MRSA infection nor to guide or monitor treatment for MRSA infections.     Medical History: Past Medical History  Diagnosis Date  . Asthma   . GERD (gastroesophageal reflux disease)   . Alcohol abuse   . Environmental allergies   . Headache   . Hypertension   . Syncope and collapse   . Vision  abnormalities   . Prolonged QT syndrome     Medications:  Anti-infectives    Start     Dose/Rate Route Frequency Ordered Stop   02/11/15 1200  anidulafungin (ERAXIS) 100 mg in sodium chloride 0.9 % 100 mL IVPB     100 mg over 90 Minutes Intravenous Every 24 hours 02/10/15 1116     02/10/15 1200  anidulafungin (ERAXIS) 200 mg in sodium chloride 0.9 % 200 mL IVPB     200 mg over 180 Minutes Intravenous  Once 02/10/15 1116     02/10/15 1115  anidulafungin (ERAXIS) 100 mg in sodium chloride 0.9 % 100 mL IVPB  Status:  Discontinued     100 mg over 90 Minutes Intravenous Every 24 hours 02/10/15 1106 02/10/15 1115   02/10/15 1100  imipenem-cilastatin (PRIMAXIN) 500 mg in sodium chloride 0.9 % 100 mL IVPB     500 mg 200 mL/hr over  30 Minutes Intravenous 4 times per day 02/10/15 1041     02/10/15 0800  cefUROXime (CEFTIN) tablet 500 mg  Status:  Discontinued     500 mg Oral 2 times daily with meals 02/10/15 0102 02/10/15 1015   02/10/15 0115  metroNIDAZOLE (FLAGYL) tablet 500 mg  Status:  Discontinued     500 mg Oral 3 times per day 02/10/15 0102 02/10/15 1015     Assessment: 29 Y/O with recent hospitalization for alcohol pancreatitis, splenic vein thrombosis. Discharged on anticoagulation and Cefuroxime/Flagyl; now admitted with abdominal pain and hypotension. Found to have multiple fluid collections in abd that are likely a combination of pancreatic pseudocysts (w/out necrosis) and intraabdominal hemorrhage.  Pharmacy to begin Primaxin for intra-abdominal infection.  10/23 Cefuroxime/Flagyl >> 10/24 10/24 >> Primaxin >> 10/24 >> Eraxis (MD) >>    No Cx obtained  Afebrile; some fevers during previous encounter 10/14-10/17 WBC elevated and rising Renal: SCr stable wnl; CrCl > 90 CG/N   Goal of Therapy:  Vancomycin trough level 15-20 mcg/ml  Eradication of infection Appropriate antibiotic dosing for indication and renal function  Plan:  Day 1 antibiotics (day 2 total abx) Primaxin 500 mg IV q6 hr Anidulafungin dosing per MD is appropriate for renal function and indication  Follow clinical course, renal function, culture results as available  Follow for de-escalation of antibiotics and LOT   Bernadene Person, PharmD, BCPS Pager: [phone] 02/10/2015, 12:23 PM

## 2015-02-10 NOTE — Progress Notes (Signed)
Progress Note   Kara [name] NWG:[id number] DOB: 03/24/1986 DOA: 02/09/2015 PCP:  Duane Lopeoss, Alan, MD   Brief Narrative:   Kara [name] is an 29 y.o. female with a PMH of alcoholic pancreatitis and splenic vein thrombosis, recently hospitalized from 01/29/15-02/09/15 who was readmitted with a chief complaint of 10/10 lower abdominal pain. CT of the abdomen/pelvis showed an intra-abdominal hematoma. PT/INR was 2.29. FFP and vitamin K were administered and surgery was subsequently consulted.  Assessment/Plan:   Principal Problem:   Abdominal hematoma in the setting of warfarin-induced coagulopathy - Status post FFP and vitamin K. Discontinue anticoagulation. - General surgery and critical care team both consulting. - Monitor H&H every 8 hours. Transfuse if needed. - Consider arteriogram for evidence of ongoing bleeding.  Active Problems: Rectal bleeding - Patient also reports rectal bleeding. Reverse INR and follow.  Alcoholic Pancreatitis with intractable abdominal pain and leukocytosis complicated by splenic vein thrombosis - Imaging shows intra-abdominal hematoma/hemorrhagic pancreatitis.  - Lipase is: 43.  LFTs are elevated. - Disease severity is severe with the development of hemorrhagic complications and portal vein thrombosis. - Monitor BUN, hemoglobin, creatinine (elevations show hypovolemia). No evidence of hypovolemia at present. - Monitor WBC and fluid volume shifts (indicate inflammation). - Monitor creatinine, LFTs, and oxygenation status (indicates organ damage). - Monitor calcium (low calcium indicative of fat necrosis/saponification---end organ damage & hypovolemia). - Management is supportive: IVF with Lactated Ringers.  Lactated ringers decreases SIRS and organ failure risk by up to 80% compared to NS. - Monitor urine output.  Aim for > 0.5 to 1 mL/kg/hour. - Bowel rest until pain improved.  Downgrade diet to clear liquids. Advance to low fat when able to  tolerate. - Early enteral nutrition improves morbidity (infection) over total parenteral nutrition (TPN) or nothing-by-mouth status (NPO). Nasojejunal feeding via a small-bore tube is preferred in patients who cannot tolerate oral intake, or gastric feeding via nasogastric tube provided there is not frequent emesis. - Monitor for intraabdominal compartment syndrome if aggressive IVF needed. - Monitor for other potential complications: Glucose intolerance, DM, splenic aneurysm. - Was discharged on Ceftin/Flagyl in the setting of fevers to treat any associated infection associated with pancreatitis. - Critical care team consulted and placed her on Primaxin empirically.    Chronic diastolic heart failure (HCC) - 2-D echo done 06/17/14. EF 55-60 percent. Grade 2 diastolic dysfunction noted. - Monitor closely for decompensation. Strict I/O and daily weights.    Hypotension, history of Hypertension - Hold metoprolol    Hypoalbuminemia due to protein-calorie malnutrition (HCC)/edema - Dietitian consultation requested.    Asthma - Continue Dulera, Allegra and Singulair. Bronchodilators as needed.    DVT Prophylaxis - SCDs ordered.   Family Communication/Anticipated D/C date and plan/Code Status   Family Communication: No family currently at the bedside. Disposition Plan: Home when hemodynamically stable and no evidence of ongoing bleeding. Anticipated D/C date:   Depends on recovery, but likely will need at least 3 days in the hospital. Code Status:     Code Status Orders        Start     Ordered   02/10/15 0103  Full code   Continuous     02/10/15 0102       IV Access:    Peripheral IV, critical care team to place central line.   Procedures and diagnostic studies:   [place] Transvaginal Non-ob  02/09/2015  ADDENDUM REPORT: 02/09/2015 23:43 ADDENDUM: The Doppler findings should read: Pulsed Doppler evaluation of the right ovary  demonstrates normal low-resistance arterial and  venous waveforms. The left ovary is not visualized on this study. Subsequent CT demonstrates a grossly unremarkable appearance to both ovaries. Electronically Signed   By: Roanna Raider M.D.   On: 02/09/2015 23:43  02/09/2015  CLINICAL DATA:  Acute onset of pelvic pain.  Initial encounter. EXAM: TRANSABDOMINAL AND TRANSVAGINAL ULTRASOUND OF PELVIS DOPPLER ULTRASOUND OF OVARIES TECHNIQUE: Both transabdominal and transvaginal ultrasound examinations of the pelvis were performed. Transabdominal technique was performed for global imaging of the pelvis including uterus, ovaries, adnexal regions, and pelvic cul-de-sac. It was necessary to proceed with endovaginal exam following the transabdominal exam to visualize the uterus and ovaries in greater detail. Color and duplex Doppler ultrasound was utilized to evaluate blood flow to the ovaries. COMPARISON:  CT of the abdomen and pelvis from 02/01/2015 FINDINGS: Uterus Measurements: 4.6 x 2.0 x 2.6 cm. No fibroids or other mass visualized. Endometrium Thickness: 0.2 cm.  No focal abnormality visualized. Right ovary Measurements: 2.9 x 2.2 x 2.7 cm. Normal appearance/no adnexal mass. Left ovary Not visualized. Pulsed Doppler evaluation of both ovaries demonstrates normal low-resistance arterial and venous waveforms. Other findings A small amount of free fluid is seen within the pelvic cul-de-sac. IMPRESSION: Unremarkable pelvic ultrasound. No evidence for ovarian torsion. The left ovary is not visualized on this study. Electronically Signed: By: Roanna Raider M.D. On: 02/09/2015 23:08   US Pelvis Complete  02/09/2015  ADDENDUM REPORT: 02/09/2015 23:43 ADDENDUM: The Doppler findings should read: Pulsed Doppler evaluation of the right ovary demonstrates normal low-resistance arterial and venous waveforms. The left ovary is not visualized on this study. Subsequent CT demonstrates a grossly unremarkable appearance to both ovaries. Electronically Signed   By: Roanna Raider  M.D.   On: 02/09/2015 23:43  02/09/2015  CLINICAL DATA:  Acute onset of pelvic pain.  Initial encounter. EXAM: TRANSABDOMINAL AND TRANSVAGINAL ULTRASOUND OF PELVIS DOPPLER ULTRASOUND OF OVARIES TECHNIQUE: Both transabdominal and transvaginal ultrasound examinations of the pelvis were performed. Transabdominal technique was performed for global imaging of the pelvis including uterus, ovaries, adnexal regions, and pelvic cul-de-sac. It was necessary to proceed with endovaginal exam following the transabdominal exam to visualize the uterus and ovaries in greater detail. Color and duplex Doppler ultrasound was utilized to evaluate blood flow to the ovaries. COMPARISON:  CT of the abdomen and pelvis from 02/01/2015 FINDINGS: Uterus Measurements: 4.6 x 2.0 x 2.6 cm. No fibroids or other mass visualized. Endometrium Thickness: 0.2 cm.  No focal abnormality visualized. Right ovary Measurements: 2.9 x 2.2 x 2.7 cm. Normal appearance/no adnexal mass. Left ovary Not visualized. Pulsed Doppler evaluation of both ovaries demonstrates normal low-resistance arterial and venous waveforms. Other findings A small amount of free fluid is seen within the pelvic cul-de-sac. IMPRESSION: Unremarkable pelvic ultrasound. No evidence for ovarian torsion. The left ovary is not visualized on this study. Electronically Signed: By: Roanna Raider M.D. On: 02/09/2015 23:08   Ct Abdomen Pelvis W Contrast  02/09/2015  CLINICAL DATA:  Acute onset of pelvic and lower abdominal pain. Lower back pain. Initial encounter. EXAM: CT ABDOMEN AND PELVIS WITH CONTRAST TECHNIQUE: Multidetector CT imaging of the abdomen and pelvis was performed using the standard protocol following bolus administration of intravenous contrast. CONTRAST:  OMNIPAQUE IOHEXOL 300 MG/ML  SOLN COMPARISON:  CT of the abdomen and pelvis performed 02/01/2015, and pelvic ultrasound performed earlier today at 10:21 p.m. FINDINGS: A small left pleural effusion is noted, with  bibasilar atelectasis. Since the prior study, there has been  interval development of a large heterogeneous 13.7 x 10.0 x 7.4 cm collection of fluid at the left mid abdomen and left lower quadrant, tracking inferiorly from the pancreatic tail, with multiple small foci of high attenuation. It is noted anterior to the left kidney and left psoas musculature. This is highly suspicious for a new intra-abdominal hematoma, with multiple small foci of contrast extravasation, reflecting active bleeding. This is a very unusual appearance for hemorrhagic pancreatitis. New retroperitoneal dense soft tissue inflammation is also noted about the level of the aortic bifurcation, measuring 7.5 x 5.2 x 2.9 cm, concerning for extension of soft tissue inflammation into the inferior retroperitoneum. New evolving pseudocysts are noted about the pancreatic tail, measuring up to 5.2 cm in size. One of these fluid collections extends adjacent to small bowel loops. Previously noted soft tissue inflammation about the head and body of the pancreas has improved. There is no evidence of devascularization. There is diffuse fatty infiltration within the liver. The spleen is unremarkable in appearance, though inflammatory fluid is seen tracking about the spleen. The gallbladder is grossly unremarkable. The adrenal glands are grossly unremarkable, though the left adrenal gland is difficult to fully assess due to surrounding fluid. The kidneys are unremarkable in appearance. There is no evidence of hydronephrosis. No renal or ureteral stones are seen. No perinephric stranding is appreciated. Minimal left-sided perinephric fluid likely reflects the overlying hematoma. The small bowel is unremarkable in appearance. A vague evolving pseudocyst is noted at the fundus of the stomach, similar in appearance to the prior study, measuring approximately 4.0 cm. The stomach is otherwise grossly unremarkable in appearance. The appendix is normal in caliber,  without evidence of appendicitis. The colon is decompressed and not well assessed, but appears grossly unremarkable. The bladder is decompressed. Somewhat complex fluid is noted tracking within the pelvis, likely extending from the patient's pancreatitis. The uterus is unremarkable in appearance. The ovaries are grossly symmetric. No suspicious adnexal masses are seen. No inguinal lymphadenopathy is seen. No acute osseous abnormalities are identified. IMPRESSION: 1. Interval development of a large heterogeneous 13.7 x 10.0 x 7.4 cm collection of fluid at the left mid abdomen and left lower quadrant, tracking inferiorly from the pancreatic tail anterior to the left kidney and left psoas musculature. Multiple small foci of high attenuation noted within the collection. This is highly suspicious for a new intra-abdominal hematoma, with multiple small foci of active bleeding. This may reflect pancreatic enzymes eroding into tiny surrounding vessels, given that the patient is on anticoagulation. This is a very unusual appearance for hemorrhagic pancreatitis. 2. Additional new retroperitoneal dense soft tissue inflammation noted about the level of the aortic bifurcation, measuring 7.5 x 5.2 x 2.9 cm, concerning for extension of soft tissue inflammation into the inferior retroperitoneum. 3. New evolving pseudocysts about the pancreatic tail, measuring up to 5.2 cm in size. One of these fluid collections extends adjacent to small bowel loops, though the small bowel is unremarkable in appearance. Soft tissue inflammation about the head and body of the pancreas has improved. No evidence of devascularization. 4. 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 Complex fluid tracks about the spleen. 5. Somewhat complex fluid noted tracking within the pelvis, likely extending from the patient's pancreatitis. 6. Small left pleural effusion noted, with bibasilar  atelectasis. 7. Diffuse fatty infiltration within the liver. Critical Value/emergent results were called by telephone at the time of interpretation on 02/09/2015 at 11:35  pm to Dr. Glynn Octave, who verbally acknowledged these results. Electronically Signed   By: Roanna Raider M.D.   On: 02/09/2015 23:57   [place] Art/ven Flow Abd Pelv Doppler  02/09/2015  ADDENDUM REPORT: 02/09/2015 23:43 ADDENDUM: The Doppler findings should read: Pulsed Doppler evaluation of the right ovary demonstrates normal low-resistance arterial and venous waveforms. The left ovary is not visualized on this study. Subsequent CT demonstrates a grossly unremarkable appearance to both ovaries. Electronically Signed   By: Roanna Raider M.D.   On: 02/09/2015 23:43  02/09/2015  CLINICAL DATA:  Acute onset of pelvic pain.  Initial encounter. EXAM: TRANSABDOMINAL AND TRANSVAGINAL ULTRASOUND OF PELVIS DOPPLER ULTRASOUND OF OVARIES TECHNIQUE: Both transabdominal and transvaginal ultrasound examinations of the pelvis were performed. Transabdominal technique was performed for global imaging of the pelvis including uterus, ovaries, adnexal regions, and pelvic cul-de-sac. It was necessary to proceed with endovaginal exam following the transabdominal exam to visualize the uterus and ovaries in greater detail. Color and duplex Doppler ultrasound was utilized to evaluate blood flow to the ovaries. COMPARISON:  CT of the abdomen and pelvis from 02/01/2015 FINDINGS: Uterus Measurements: 4.6 x 2.0 x 2.6 cm. No fibroids or other mass visualized. Endometrium Thickness: 0.2 cm.  No focal abnormality visualized. Right ovary Measurements: 2.9 x 2.2 x 2.7 cm. Normal appearance/no adnexal mass. Left ovary Not visualized. Pulsed Doppler evaluation of both ovaries demonstrates normal low-resistance arterial and venous waveforms. Other findings A small amount of free fluid is seen within the pelvic cul-de-sac. IMPRESSION: Unremarkable pelvic ultrasound. No evidence  for ovarian torsion. The left ovary is not visualized on this study. Electronically Signed: By: Roanna Raider M.D. On: 02/09/2015 23:08   Dg Chest Portable 1 View  02/09/2015  CLINICAL DATA:  29 year old female with severe lower abdominal pain and shortness of breath. EXAM: PORTABLE CHEST 1 VIEW COMPARISON:  Chest x-ray 01/31/2015. FINDINGS: Small bilateral pleural effusions (left greater than right). Bibasilar opacities favored to predominantly reflect subsegmental atelectasis (underlying airspace consolidation in the left lower lobe is not excluded). Mid to upper lungs are clear. Pulmonary venous congestion, without frank pulmonary edema. Heart size appears borderline enlarged. Upper mediastinal contours are slightly distorted by patient positioning. IMPRESSION: 1. Small bilateral pleural effusions (left greater than right) with probable bibasilar subsegmental atelectasis. 2. Borderline cardiomegaly with mild pulmonary venous congestion, but no frank pulmonary edema. Electronically Signed   By: Trudie Reed M.D.   On: 02/09/2015 21:10     Medical Consultants:    Dr. Carolynne Edouard, Surgery.  Dr. [name], Critical Care  Anti-Infectives:   Anti-infectives    Start     Dose/Rate Route Frequency Ordered Stop   02/11/15 1200  anidulafungin (ERAXIS) 100 mg in sodium chloride 0.9 % 100 mL IVPB     100 mg over 90 Minutes Intravenous Every 24 hours 02/10/15 1116     02/10/15 1200  anidulafungin (ERAXIS) 200 mg in sodium chloride 0.9 % 200 mL IVPB     200 mg over 180 Minutes Intravenous  Once 02/10/15 1116     02/10/15 1115  anidulafungin (ERAXIS) 100 mg in sodium chloride 0.9 % 100 mL IVPB  Status:  Discontinued     100 mg over 90 Minutes Intravenous Every 24 hours 02/10/15 1106 02/10/15 1115   02/10/15 1100  imipenem-cilastatin (PRIMAXIN) 500 mg in sodium chloride 0.9 % 100 mL IVPB     500 mg 200 mL/hr over 30 Minutes Intravenous 4 times per day 02/10/15 1041     02/10/15  0800  cefUROXime (CEFTIN)  tablet 500 mg  Status:  Discontinued     500 mg Oral 2 times daily with meals 02/10/15 0102 02/10/15 1015   02/10/15 0115  metroNIDAZOLE (FLAGYL) tablet 500 mg  Status:  Discontinued     500 mg Oral 3 times per day 02/10/15 0102 02/10/15 1015      Subjective:    Kara [name] is very anxious and panicky. She reports some central chest pressure that she feels is from her anxiety. Mildly short of breath. Has abdominal pain, 10/10. Reports some presyncope type symptoms including dizziness. Says she had a stool with some coagulated blood on the outside.  Objective:    Filed Vitals:   02/10/15 0940 02/10/15 1000 02/10/15 1100 02/10/15 1200  BP: 106/67 108/58 124/68 105/64  Pulse: 125 126 124 139  Temp: 99.4 F (37.4 C)     TempSrc: Oral     Resp: 25 27 36 35  Height: 5\' 5"  (1.651 m)     Weight: 100.4 kg (221 lb 5.5 oz)     SpO2: 96% 95% 94% 93%    Intake/Output Summary (Last 24 hours) at 02/10/15 1239 Last data filed at 02/10/15 1000  Gross per 24 hour  Intake 2641.17 ml  Output      0 ml  Net 2641.17 ml   Filed Weights   02/10/15 0940  Weight: 100.4 kg (221 lb 5.5 oz)    Exam: Gen:  NAD Cardiovascular:  Tachycardic, No M/R/G Respiratory:  Lungs CTAB Gastrointestinal:  Abdomen soft, diffusely tender, + BS Extremities:  2+ edema   Data Reviewed:    Labs: Basic Metabolic Panel:  Recent Labs Lab 02/04/15 0805  02/07/15 0540 02/08/15 0550 02/09/15 0512 02/09/15 2040 02/10/15 0157  NA 137  < > 139 141 139 136 135  K 2.9*  < > 3.2* 3.8 4.5 4.4 4.2  CL 104  < > 104 107 103 101 102  CO2 26  < > 27 28 28 25 22   GLUCOSE 111*  < > 116* 110* 115* 130* 142*  BUN <5*  < > <5* <5* <5* <5* <5*  CREATININE 0.56  < > 0.53 0.59 0.57 0.50 0.56  CALCIUM 8.2*  < > 8.4* 8.4* 8.6* 8.7* 8.2*  MG 2.0  --   --   --   --   --   --   < > = values in this interval not displayed. GFR Estimated Creatinine Clearance: 120.8 mL/min (by C-G formula based on Cr of 0.56). Liver  Function Tests:  Recent Labs Lab 02/06/15 0510 02/07/15 0540 02/08/15 0550 02/09/15 0512 02/09/15 2040  AST 70* 68* 74* 53* 50*  ALT 35 33 31 26 25   ALKPHOS 210* 215* 217* 198* 200*  BILITOT 1.5* 1.5* 1.3* 1.1 1.6*  PROT 5.7* 6.0* 5.8* 6.0* 6.3*  ALBUMIN 2.3* 2.3* 2.3* 2.4* 2.5*    Recent Labs Lab 02/09/15 2040  LIPASE 43   No results for input(s): AMMONIA in the last 168 hours. Coagulation profile  Recent Labs Lab 02/08/15 0550 02/09/15 0512 02/09/15 2132 02/10/15 0157 02/10/15 1100  INR 2.75* 2.69* 2.29* 2.17* 1.40    CBC:  Recent Labs Lab 02/07/15 0540 02/08/15 0550 02/09/15 0512 02/09/15 2040 02/10/15 0157  WBC 14.6* 12.3* 11.2* 15.7* 17.0*  NEUTROABS  --   --   --  13.4*  --   HGB 11.7* 11.0* 11.4* 11.7* 11.2*  HCT 36.0 34.1* 35.5* 36.3 34.1*  MCV 99.2 99.4 100.6* 98.9 98.3  PLT 383 357 363 412* 403*   Microbiology Recent Results (from the past 240 hour(s))  Culture, Urine     Status: None   Collection Time: 02/03/15  3:47 AM  Result Value Ref Range Status   Specimen Description URINE, CLEAN CATCH  Final   Special Requests NONE  Final   Culture   Final    NO GROWTH 1 DAY Performed at [facility]    Report Status 02/04/2015 FINAL  Final  Wet prep, genital     Status: None   Collection Time: 02/09/15  9:13 PM  Result Value Ref Range Status   Yeast Wet Prep HPF POC NONE SEEN NONE SEEN Final   Trich, Wet Prep NONE SEEN NONE SEEN Final   Clue Cells Wet Prep HPF POC NONE SEEN NONE SEEN Final   WBC, Wet Prep HPF POC NONE SEEN NONE SEEN Final  MRSA PCR Screening     Status: None   Collection Time: 02/10/15  1:04 AM  Result Value Ref Range Status   MRSA by PCR NEGATIVE NEGATIVE Final    Comment:        The GeneXpert MRSA Assay (FDA approved for NASAL specimens only), is one component of a comprehensive MRSA colonization surveillance program. It is not intended to diagnose MRSA infection nor to guide or monitor treatment for MRSA  infections.      Medications:   . sodium chloride   Intravenous Once  . sodium chloride   Intravenous Once  . [START ON 02/11/2015] anidulafungin  100 mg Intravenous Q24H  . anidulafungin  200 mg Intravenous Once  . fentaNYL      . gabapentin  300 mg Oral BID WC  . gabapentin  600 mg Oral QHS  . imipenem-cilastatin  500 mg Intravenous 4 times per day  . lidocaine (PF)      . loratadine  10 mg Oral Daily  . metoprolol      . montelukast  10 mg Oral Daily  . multivitamin with minerals  1 tablet Oral Daily  . pantoprazole  40 mg Oral Daily   Continuous Infusions: . sodium chloride 125 mL/hr at 02/10/15 1232  . phenylephrine (NEO-SYNEPHRINE) Adult infusion      Time spent: 35 minutes.  The patient is medically complex with multiple co-morbidities and is at high risk for clinical deterioration and requires high complexity decision making and coordination of care with multiple specialists.     LOS: 1 day   Nils Thor  [facility] Pager [phone]. If unable to reach me by pager, please call my cell phone at [phone].  *Please refer to [email or web address], password [email or web address] to get updated schedule on who will round on this patient, as hospitalists switch teams weekly. If 7PM-7AM, please contact night-coverage at [email or web address], password [email or web address] for any overnight needs.  02/10/2015, 12:39 PM

## 2015-02-10 NOTE — Progress Notes (Signed)
Patient's heart rate is NST at 128-137,  BP 110 / 62 MD on call notified, order received, patient resting and sleeping will continue to monitor

## 2015-02-10 NOTE — Consult Note (Signed)
Chief Complaint: Patient was seen in consultation today for visceral/mesenteric arteriogram with possible embolization Chief Complaint  Patient presents with  . Groin Pain  . Back Pain  . Abdominal Pain    Referring Physician(s): CCS/CCM  History of Present Illness: Kara [name] is a 29 y.o. 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 Patient recently discharged on anticoagulation and readmitted with abdominal pain. CT of the abdomen on 10/23 revealed interval development of a large heterogeneous collection of fluid at the left midabdomen and the left lower quadrant tracking inferiorly from the pancreatic tail anterior to the left kidney and left psoas musculature suspicious for intra-abdominal hematoma with multiple small foci of active bleeding. In addition there was new retroperitoneal dense soft tissue inflammatory changes at the level of the aortic bifurcation concerning for extension of soft tissue inflammation into the inferior retroperitoneum as well as new evolving pseudocysts about the pancreatic tail and near fundus of stomach. Patient was coagulopathic and anemic on presentation and has been transfused with packed red blood cells and FFP in the interim. She is also on intravenous antibiotic therapy. Current WBC is 17, hemoglobin 11.2, platelets 43, PT 17.3, INR 1.4. Request now received from surgery and critical care for mesenteric/visceral arteriogram with possible embolization. Patient currently denies fever, headache, significant chest pain, cough,  nausea or vomiting. She she is quite anxious with some dyspnea and diffuse abdominal pain with radiation into the back.  Past Medical History  Diagnosis Date  . Asthma   . GERD (gastroesophageal reflux disease)   . Alcohol abuse   . Environmental allergies   . Headache   . Hypertension   . Syncope and collapse   . Vision abnormalities   . Prolonged QT syndrome      Past Surgical History  Procedure Laterality Date  . Tympanostomy tube placement    . Esophagogastroduodenoscopy N/A 07/17/2014    Procedure: ESOPHAGOGASTRODUODENOSCOPY (EGD);  Surgeon: Dorena Cookey, MD;  Location: [facility];  Service: Endoscopy;  Laterality: N/A;    Allergies: Peanuts  Medications: Prior to Admission medications   Medication Sig Start Date End Date Taking? Authorizing Provider  albuterol (PROVENTIL HFA;VENTOLIN HFA) 108 (90 BASE) MCG/ACT inhaler Inhale 2 puffs into the lungs every 6 (six) hours as needed for wheezing or shortness of breath (wheezing and sob).    Yes Historical Provider, MD  cefUROXime (CEFTIN) 500 MG tablet Take 1 tablet (500 mg total) by mouth 2 (two) times daily with a meal. For 4days 02/09/15  Yes Zannie Cove, MD  fexofenadine (ALLEGRA) 180 MG tablet Take 180 mg by mouth daily as needed for allergies (allergies).    Yes Historical Provider, MD  gabapentin (NEURONTIN) 300 MG capsule One in am, one in evening and two po at night Patient taking differently: Take 300-600 mg by mouth 3 (three) times daily. One in am, one in evening and two po at night 09/02/14  Yes Asa Lente, MD  metoprolol succinate (TOPROL-XL) 25 MG 24 hr tablet Take 1 tablet (25 mg total) by mouth daily. 06/17/14  Yes Wendall Stade, MD  metroNIDAZOLE (FLAGYL) 500 MG tablet Take 1 tablet (500 mg total) by mouth every 8 (eight) hours. For 4days 02/09/15  Yes Zannie Cove, MD  mometasone-formoterol [facility]) 200-5 MCG/ACT AERO Inhale 2 puffs into the lungs 2 (two) times daily.   Yes Historical Provider, MD  montelukast (SINGULAIR) 10 MG tablet Take 10 mg by mouth daily.    Yes  Historical Provider, MD  Multiple Vitamin (MULTIVITAMIN WITH MINERALS) TABS tablet Take 1 tablet by mouth daily. 07/19/14  Yes Belkys A Regalado, MD  oxyCODONE (OXY IR/ROXICODONE) 5 MG immediate release tablet Take 1 tablet (5 mg total) by mouth every 6 (six) hours as needed for severe pain. 02/09/15  Yes  Zannie Cove, MD  potassium chloride SA (K-DUR,KLOR-CON) 20 MEQ tablet Take 1 tablet (20 mEq total) by mouth daily. Take while on Lasix 02/09/15  Yes Zannie Cove, MD  furosemide (LASIX) 20 MG tablet Take 1 tablet (20 mg total) by mouth 2 (two) times daily. For 3-4days, for leg swelling Patient not taking: Reported on 02/09/2015 02/09/15   Zannie Cove, MD  ondansetron (ZOFRAN) 4 MG tablet Take 1 tablet (4 mg total) by mouth every 8 (eight) hours as needed for nausea or vomiting. Patient not taking: Reported on 02/09/2015 02/09/15   Zannie Cove, MD  polyethylene glycol [facility]) packet Take 17 g by mouth daily as needed. Patient not taking: Reported on 10/29/2014 07/19/14   Belkys A Regalado, MD  warfarin (COUMADIN) 5 MG tablet Take 1 tablet (5 mg total) by mouth daily. Dose may be adjusted based on INR check Wednesday Patient not taking: Reported on 02/09/2015 02/09/15   Zannie Cove, MD     Family History  Problem Relation Age of Onset  . Heart failure Mother   . Heart failure Father   . Heart failure Maternal Grandfather     Social History   Social History  . Marital Status: Married    Spouse Name: [name]  . Number of Children: 0  . Years of Education: N/A   Occupational History  . College advisor    Social History Main Topics  . Smoking status: Former Smoker -- 0.50 packs/day for 2 years    Types: Cigarettes    Quit date: 04/08/2012  . Smokeless tobacco: Never Used  . Alcohol Use: 1.8 oz/week    0 Standard drinks or equivalent, 3 Shots of liquor per week     Comment: occasionally  . Drug Use: No  . Sexual Activity: Yes    Birth Control/ Protection: None   Other Topics Concern  . None   Social History Narrative   Married.      Review of Systems see above  Vital Signs: BP 109/63 mmHg  Pulse 140  Temp(Src) 98.9 F (37.2 C) (Oral)  Resp 23  Ht  (1.651 m)  Wt 221 lb 5.5 oz (100.4 kg)  BMI 36.83 kg/m2  SpO2 97%  LMP 12/30/2014  (LMP Unknown)  Physical Exam patient awake, alert. Left IJ central line in place ;Chest with diminished breath sounds bilaterally. Heart tachycardic but regular. Abdomen soft, obese, diffuse moderate tenderness to palpation, positive bowel sounds; extremities with full range of motion, 1-2+ bilateral pretibial edema                                                                                                               Mallampati Score:  Imaging: Dg Chest 1 View  02/10/2015  CLINICAL DATA:  Central line placement EXAM: CHEST 1 VIEW COMPARISON:  02/09/2015 FINDINGS: Left IJ venous catheter terminates in the mid SVC. Patchy right lower lobe opacity, likely atelectasis. Cardiomegaly with pulmonary vascular congestion and possible mild interstitial edema edema. Moderate layering left pleural effusion, mildly increased. Small right pleural effusion. IMPRESSION: Left IJ venous catheter terminates in the mid SVC. Cardiomegaly pulmonary vascular congestion and possible mild interstitial edema. Moderate layering left pleural effusion, mildly increased. Small right pleural effusion. Patchy right lower lobe opacity, likely atelectasis. Electronically Signed   By: Charline Bills M.D.   On: 02/10/2015 11:54   Mr Abdomen W Wo Contrast  02/03/2015  CLINICAL DATA:  Portal vein thrombosis questioned on recent CT scan. Pancreatitis. EXAM: MRI ABDOMEN WITHOUT AND WITH CONTRAST TECHNIQUE: Multiplanar multisequence MR imaging of the abdomen was performed both before and after the administration of intravenous contrast. CONTRAST:  20mL MULTIHANCE GADOBENATE DIMEGLUMINE 529 MG/ML IV SOLN COMPARISON:  02/01/2015 FINDINGS: Lower chest: Moderate left and small right pleural effusions with associated passive atelectasis. Hepatobiliary: Diffuse hepatic steatosis with extensive dropout of signal in the hepatic parenchyma on out of phase images compared in phase images. Pancreas: Suspected pancreatic pseudocyst tracking  along the left lateral border of the stomach, one measuring about 4.5 cm diameter and the other measuring about 3.5 cm diameter, with adjoining peripancreatic fluid collection on image 17 series 8. Additional peripancreatic acute fluid collection extending caudad along the left anterior retroperitoneal margin. There is increased T2 signal in the pancreas but I do not observe a pancreatic mass. Spleen: Unremarkable Adrenals/Urinary Tract: Unremarkable Stomach/Bowel: Aside from the suspected pseudocysts noted above, no significant abnormality. Vascular/Lymphatic: Thrombosed splenic vein, new from 01/29/2015 but stable compared to 02/01/2015. The thrombosis of the splenic vein extends to the confluence of the superior mesenteric vein in the splenic vein, with a small amount of nonocclusive clot extending down into the SMV and into the portal vein confluence. Other: No supplemental non-categorized findings. Musculoskeletal: Unremarkable IMPRESSION: 1. Splenic vein thrombosis/occlusion. Thrombus in the splenic vein extends into the portal vein confluence, with small amounts of nonocclusive thrombus extending in the portal vein and proximal SMV. 2. Diffuse hepatic steatosis. 3. Pancreatitis with acute peripancreatic fluid collections and probable early pseudocyst formation along the left lateral border of the stomach. Fluid collection with somewhat irregular enhancing margins tracks around the anterior perirenal fascia all margin ; given the enhancing margins, early abscess formation is not readily excluded. I do not see pancreatic parenchymal necrosis or an abscess involving the pancreatic parenchyma itself. Electronically Signed   By: Gaylyn Rong M.D.   On: 02/03/2015 15:20   US Transvaginal Non-ob  02/09/2015  ADDENDUM REPORT: 02/09/2015 23:43 ADDENDUM: The Doppler findings should read: Pulsed Doppler evaluation of the right ovary demonstrates normal low-resistance arterial and venous waveforms. The left  ovary is not visualized on this study. Subsequent CT demonstrates a grossly unremarkable appearance to both ovaries. Electronically Signed   By: Roanna Raider M.D.   On: 02/09/2015 23:43  02/09/2015  CLINICAL DATA:  Acute onset of pelvic pain.  Initial encounter. EXAM: TRANSABDOMINAL AND TRANSVAGINAL ULTRASOUND OF PELVIS DOPPLER ULTRASOUND OF OVARIES TECHNIQUE: Both transabdominal and transvaginal ultrasound examinations of the pelvis were performed. Transabdominal technique was performed for global imaging of the pelvis including uterus, ovaries, adnexal regions, and pelvic cul-de-sac. It was necessary to proceed with endovaginal exam following the transabdominal exam to visualize the uterus and ovaries in greater detail. Color  and duplex Doppler ultrasound was utilized to evaluate blood flow to the ovaries. COMPARISON:  CT of the abdomen and pelvis from 02/01/2015 FINDINGS: Uterus Measurements: 4.6 x 2.0 x 2.6 cm. No fibroids or other mass visualized. Endometrium Thickness: 0.2 cm.  No focal abnormality visualized. Right ovary Measurements: 2.9 x 2.2 x 2.7 cm. Normal appearance/no adnexal mass. Left ovary Not visualized. Pulsed Doppler evaluation of both ovaries demonstrates normal low-resistance arterial and venous waveforms. Other findings A small amount of free fluid is seen within the pelvic cul-de-sac. IMPRESSION: Unremarkable pelvic ultrasound. No evidence for ovarian torsion. The left ovary is not visualized on this study. Electronically Signed: By: Roanna Raider M.D. On: 02/09/2015 23:08   US Pelvis Complete  02/09/2015  ADDENDUM REPORT: 02/09/2015 23:43 ADDENDUM: The Doppler findings should read: Pulsed Doppler evaluation of the right ovary demonstrates normal low-resistance arterial and venous waveforms. The left ovary is not visualized on this study. Subsequent CT demonstrates a grossly unremarkable appearance to both ovaries. Electronically Signed   By: Roanna Raider M.D.   On: 02/09/2015  23:43  02/09/2015  CLINICAL DATA:  Acute onset of pelvic pain.  Initial encounter. EXAM: TRANSABDOMINAL AND TRANSVAGINAL ULTRASOUND OF PELVIS DOPPLER ULTRASOUND OF OVARIES TECHNIQUE: Both transabdominal and transvaginal ultrasound examinations of the pelvis were performed. Transabdominal technique was performed for global imaging of the pelvis including uterus, ovaries, adnexal regions, and pelvic cul-de-sac. It was necessary to proceed with endovaginal exam following the transabdominal exam to visualize the uterus and ovaries in greater detail. Color and duplex Doppler ultrasound was utilized to evaluate blood flow to the ovaries. COMPARISON:  CT of the abdomen and pelvis from 02/01/2015 FINDINGS: Uterus Measurements: 4.6 x 2.0 x 2.6 cm. No fibroids or other mass visualized. Endometrium Thickness: 0.2 cm.  No focal abnormality visualized. Right ovary Measurements: 2.9 x 2.2 x 2.7 cm. Normal appearance/no adnexal mass. Left ovary Not visualized. Pulsed Doppler evaluation of both ovaries demonstrates normal low-resistance arterial and venous waveforms. Other findings A small amount of free fluid is seen within the pelvic cul-de-sac. IMPRESSION: Unremarkable pelvic ultrasound. No evidence for ovarian torsion. The left ovary is not visualized on this study. Electronically Signed: By: Roanna Raider M.D. On: 02/09/2015 23:08   Ct Abdomen Pelvis W Contrast  02/09/2015  CLINICAL DATA:  Acute onset of pelvic and lower abdominal pain. Lower back pain. Initial encounter. EXAM: CT ABDOMEN AND PELVIS WITH CONTRAST TECHNIQUE: Multidetector CT imaging of the abdomen and pelvis was performed using the standard protocol following bolus administration of intravenous contrast. CONTRAST:  OMNIPAQUE IOHEXOL 300 MG/ML  SOLN COMPARISON:  CT of the abdomen and pelvis performed 02/01/2015, and pelvic ultrasound performed earlier today at 10:21 p.m. FINDINGS: A small left pleural effusion is noted, with bibasilar atelectasis.  Since the prior study, there has been interval development of a large heterogeneous 13.7 x 10.0 x 7.4 cm collection of fluid at the left mid abdomen and left lower quadrant, tracking inferiorly from the pancreatic tail, with multiple small foci of high attenuation. It is noted anterior to the left kidney and left psoas musculature. This is highly suspicious for a new intra-abdominal hematoma, with multiple small foci of contrast extravasation, reflecting active bleeding. This is a very unusual appearance for hemorrhagic pancreatitis. New retroperitoneal dense soft tissue inflammation is also noted about the level of the aortic bifurcation, measuring 7.5 x 5.2 x 2.9 cm, concerning for extension of soft tissue inflammation into the inferior retroperitoneum. New evolving pseudocysts are noted about the  pancreatic tail, measuring up to 5.2 cm in size. One of these fluid collections extends adjacent to small bowel loops. Previously noted soft tissue inflammation about the head and body of the pancreas has improved. There is no evidence of devascularization. There is diffuse fatty infiltration within the liver. The spleen is unremarkable in appearance, though inflammatory fluid is seen tracking about the spleen. The gallbladder is grossly unremarkable. The adrenal glands are grossly unremarkable, though the left adrenal gland is difficult to fully assess due to surrounding fluid. The kidneys are unremarkable in appearance. There is no evidence of hydronephrosis. No renal or ureteral stones are seen. No perinephric stranding is appreciated. Minimal left-sided perinephric fluid likely reflects the overlying hematoma. The small bowel is unremarkable in appearance. A vague evolving pseudocyst is noted at the fundus of the stomach, similar in appearance to the prior study, measuring approximately 4.0 cm. The stomach is otherwise grossly unremarkable in appearance. The appendix is normal in caliber, without evidence of  appendicitis. The colon is decompressed and not well assessed, but appears grossly unremarkable. The bladder is decompressed. Somewhat complex fluid is noted tracking within the pelvis, likely extending from the patient's pancreatitis. The uterus is unremarkable in appearance. The ovaries are grossly symmetric. No suspicious adnexal masses are seen. No inguinal lymphadenopathy is seen. No acute osseous abnormalities are identified. IMPRESSION: 1. Interval development of a large heterogeneous 13.7 x 10.0 x 7.4 cm collection of fluid at the left mid abdomen and left lower quadrant, tracking inferiorly from the pancreatic tail anterior to the left kidney and left psoas musculature. Multiple small foci of high attenuation noted within the collection. This is highly suspicious for a new intra-abdominal hematoma, with multiple small foci of active bleeding. This may reflect pancreatic enzymes eroding into tiny surrounding vessels, given that the patient is on anticoagulation. This is a very unusual appearance for hemorrhagic pancreatitis. 2. Additional new retroperitoneal dense soft tissue inflammation noted about the level of the aortic bifurcation, measuring 7.5 x 5.2 x 2.9 cm, concerning for extension of soft tissue inflammation into the inferior retroperitoneum. 3. New evolving pseudocysts about the pancreatic tail, measuring up to 5.2 cm in size. One of these fluid collections extends adjacent to small bowel loops, though the small bowel is unremarkable in appearance. Soft tissue inflammation about the head and body of the pancreas has improved. No evidence of devascularization. 4. 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 Complex fluid tracks about the spleen. 5. Somewhat complex fluid noted tracking within the pelvis, likely extending from the patient's pancreatitis. 6. Small left pleural effusion noted, with bibasilar atelectasis. 7.  Diffuse fatty infiltration within the liver. Critical Value/emergent results were called by telephone at the time of interpretation on 02/09/2015 at 11:35 pm to Dr. Glynn Octave, who verbally acknowledged these results. Electronically Signed   By: Roanna Raider M.D.   On: 02/09/2015 23:57   Ct Abdomen Pelvis W Contrast  02/01/2015  CLINICAL DATA:  Follow-up pancreatitis EXAM: CT ABDOMEN AND PELVIS WITH CONTRAST TECHNIQUE: Multidetector CT imaging of the abdomen and pelvis was performed using the standard protocol following bolus administration of intravenous contrast. CONTRAST:  50mL OMNIPAQUE IOHEXOL 300 MG/ML SOLN, OMNIPAQUE IOHEXOL 300 MG/ML SOLN COMPARISON:  Ten/ 12/16 FINDINGS: There is small left pleural effusion. Trace right pleural effusion. Bilateral lower lobe posterior atelectasis. Again noted significant fatty infiltration of the liver. Heart size within normal limits. Again noted peripancreatic fluid  and significant stranding of peripancreatic fat consistent with acute pancreatitis. There is increasing peripancreatic fluid from prior exam. Small amount of perisplenic ascites. There is moderate ascites in left paracolic gutter. Small loculated fluid is noted adjacent to proximal gastric wall suspicious for loculated ascites or pancreatic pseudocyst. No definite pancreatic abscess is identified. The SMA and celiac trunk are patent. In axial image 35 there is filling defect in portal vein highly suspicious for nonocclusive thrombus. Similar appearance in axial image 36. The superior mesenteric vein is patent. No calcified gallstones are noted within gallbladder. No focal splenic mass. Kidneys are symmetrical in size. No hydronephrosis or hydroureter. Small amount of mesenteric ascites is noted adjacent to distal small bowel loops. There is moderate pelvic ascites. The uterus and ovaries are unremarkable. No distal colonic obstruction. Urinary bladder is unremarkable. There is no small bowel  obstruction. No pericecal inflammation. The terminal ileum is unremarkable. IMPRESSION: 1. There is small bilateral pleural effusion left greater than right with bilateral lower lobe posterior atelectasis. Significant fatty infiltration of the liver again noted. Again noted significant peripancreatic stranding and there is increase in peripancreatic fluid. Findings are consistent with acute pancreatitis. 2. Small perisplenic ascites. Moderate ascites noted in left paracolic gutter. Moderate pelvic ascites. Small ascites in lower mesentery adjacent to small bowel loops. 3. There is loculated fluid collection adjacent to proximal stomach just anterior to the spleen measures about 3.8 cm. This is suspicious for loculated ascites or pancreatic pseudocyst. 4. Question nonocclusive thrombosis in portal vein axial image 35 and 36. The superior mesenteric vein is patent. 5. No hydronephrosis or hydroureter. 6. No small bowel obstruction. Electronically Signed   By: Natasha MeadLiviu  Pop M.D.   On: 02/01/2015 12:48   [place] Art/ven Flow Abd Pelv Doppler  02/09/2015  ADDENDUM REPORT: 02/09/2015 23:43 ADDENDUM: The Doppler findings should read: Pulsed Doppler evaluation of the right ovary demonstrates normal low-resistance arterial and venous waveforms. The left ovary is not visualized on this study. Subsequent CT demonstrates a grossly unremarkable appearance to both ovaries. Electronically Signed   By: Roanna RaiderJeffery  Chang M.D.   On: 02/09/2015 23:43  02/09/2015  CLINICAL DATA:  Acute onset of pelvic pain.  Initial encounter. EXAM: TRANSABDOMINAL AND TRANSVAGINAL ULTRASOUND OF PELVIS DOPPLER ULTRASOUND OF OVARIES TECHNIQUE: Both transabdominal and transvaginal ultrasound examinations of the pelvis were performed. Transabdominal technique was performed for global imaging of the pelvis including uterus, ovaries, adnexal regions, and pelvic cul-de-sac. It was necessary to proceed with endovaginal exam following the transabdominal exam to  visualize the uterus and ovaries in greater detail. Color and duplex Doppler ultrasound was utilized to evaluate blood flow to the ovaries. COMPARISON:  CT of the abdomen and pelvis from 02/01/2015 FINDINGS: Uterus Measurements: 4.6 x 2.0 x 2.6 cm. No fibroids or other mass visualized. Endometrium Thickness: 0.2 cm.  No focal abnormality visualized. Right ovary Measurements: 2.9 x 2.2 x 2.7 cm. Normal appearance/no adnexal mass. Left ovary Not visualized. Pulsed Doppler evaluation of both ovaries demonstrates normal low-resistance arterial and venous waveforms. Other findings A small amount of free fluid is seen within the pelvic cul-de-sac. IMPRESSION: Unremarkable pelvic ultrasound. No evidence for ovarian torsion. The left ovary is not visualized on this study. Electronically Signed: By: Roanna RaiderJeffery  Chang M.D. On: 02/09/2015 23:08   Dg Chest Portable 1 View  02/09/2015  CLINICAL DATA:  29 year old female with severe lower abdominal pain and shortness of breath. EXAM: PORTABLE CHEST 1 VIEW COMPARISON:  Chest x-ray 01/31/2015. FINDINGS: Small bilateral pleural effusions (left greater  than right). Bibasilar opacities favored to predominantly reflect subsegmental atelectasis (underlying airspace consolidation in the left lower lobe is not excluded). Mid to upper lungs are clear. Pulmonary venous congestion, without frank pulmonary edema. Heart size appears borderline enlarged. Upper mediastinal contours are slightly distorted by patient positioning. IMPRESSION: 1. Small bilateral pleural effusions (left greater than right) with probable bibasilar subsegmental atelectasis. 2. Borderline cardiomegaly with mild pulmonary venous congestion, but no frank pulmonary edema. Electronically Signed   By: Trudie Reed M.D.   On: 02/09/2015 21:10   Dg Chest Port 1 View  01/31/2015  CLINICAL DATA:  Nonproductive cough. EXAM: PORTABLE CHEST 1 VIEW COMPARISON:  July 15, 2014. FINDINGS: The heart size and mediastinal  contours are within normal limits. No pneumothorax is noted. Right lung is clear. Mild left basilar subsegmental atelectasis is noted. No significant pleural effusion is noted. The visualized skeletal structures are unremarkable. IMPRESSION: Mild left basilar subsegmental atelectasis. Electronically Signed   By: Lupita Raider, M.D.   On: 01/31/2015 12:36   Ct Renal Stone Study  01/29/2015  CLINICAL DATA:  Left flank pain and vomiting. EXAM: CT ABDOMEN AND PELVIS WITHOUT CONTRAST TECHNIQUE: Multidetector CT imaging of the abdomen and pelvis was performed following the standard protocol without IV contrast. COMPARISON:  CT scan dated 07/13/2014 FINDINGS: Lower chest:  Normal. Hepatobiliary: New hepatomegaly with increased severe hepatic steatosis. The gallbladder appears increased in density primarily in contrast to the liver parenchyma. Pancreas: The pancreas is diffusely swollen with extensive peripancreatic fluid extending around the left adrenal gland and around the spleen and posterior to the stomach. No biliary or pancreatic ductal dilatation. The fluid extends down the left pericolic gutter into the pelvis. Small amount of fluid around the right lobe of the liver and in the right pericolic gutter. Spleen: Normal. Adrenals/Urinary Tract: Adrenal glands are normal. Kidneys are normal. Ureters are not dilated. Bladder is normal. Stomach/Bowel: Normal including the terminal ileum and appendix. Small calcification in the terminal ileum. Vascular/Lymphatic: Normal. Reproductive: Normal. Other: No free air. Musculoskeletal: Normal. IMPRESSION: 1. Acute diffuse pancreatitis with extensive peripancreatic fluid extending into the left upper quadrant and down the left pericolic gutter into the pelvis. 2. New hepatomegaly with marked progression of hepatic steatosis. Electronically Signed   By: Francene Boyers M.D.   On: 01/29/2015 13:55    Labs:  CBC:  Recent Labs  02/08/15 0550 02/09/15 0512 02/09/15 2040  02/10/15 0157  WBC 12.3* 11.2* 15.7* 17.0*  HGB 11.0* 11.4* 11.7* 11.2*  HCT 34.1* 35.5* 36.3 34.1*  PLT 357 363 412* 403*    COAGS:  Recent Labs  07/27/14 1733  02/09/15 0512 02/09/15 2132 02/10/15 0157 02/10/15 1100  INR 0.95  < > 2.69* 2.29* 2.17* 1.40  APTT 29  --   --   --   --   --   < > = values in this interval not displayed.  BMP:  Recent Labs  02/08/15 0550 02/09/15 0512 02/09/15 2040 02/10/15 0157  NA 141 139 136 135  K 3.8 4.5 4.4 4.2  CL 107 103 101 102  CO2 28 28 25 22   GLUCOSE 110* 115* 130* 142*  BUN <5* <5* <5* <5*  CALCIUM 8.4* 8.6* 8.7* 8.2*  CREATININE 0.59 0.57 0.50 0.56  GFRNONAA >60 >60 >60 >60  GFRAA >60 >60 >60 >60    LIVER FUNCTION TESTS:  Recent Labs  02/07/15 0540 02/08/15 0550 02/09/15 0512 02/09/15 2040  BILITOT 1.5* 1.3* 1.1 1.6*  AST 68* 74* 53*  50*  ALT 33 ALKPHOS 215* 217* 198* 200*  PROT 6.0* 5.8* 6.0* 6.3*  ALBUMIN 2.3* 2.3* 2.4* 2.5*    TUMOR MARKERS: No results for input(s): AFPTM, CEA, CA199, CHROMGRNA in the last 8760 hours.  Assessment and Plan: Pt with history of alcoholic pancreatitis, splenic vein thrombosis, pancreatic pseudocysts; anticoagulated as outpatient and recently  readmitted for abdominal pain and finding of large heterogeneous collection of fluid left mid abdomen and left lower quadrant tracking inferiorly from the pancreatic tail suspicious for intra-abdominal hematoma with multiple small foci of active bleeding,? pancreatic origin. Request now received for mesenteric/visceral arteriogram with possible embolization. Imaging studies have been reviewed by Dr. Deanne Coffer and case discussed with both surgery and could care medicine. Details/risks of procedure, including not limited to, internal bleeding, infection, contrast nephropathy, nontarget embolization, discussed with patient with her understanding and consent. Procedure planned emergently for today.   Thank you for this interesting  consult.  I greatly enjoyed meeting Kara [name] and look forward to participating in their care.  A copy of this report was sent to the requesting provider on this date.  Signed: D. Jeananne Rama 02/10/2015, 12:49 PM   I spent a total of 20 minutes in face to face in clinical consultation, greater than 50% of which was counseling/coordinating care for mesenteric/visceral arteriogram with possible embolization

## 2015-02-10 NOTE — Consult Note (Signed)
Reason for Consult:  Intraabdominal hematoma on anticoagualtion Referring Physician: Dr. Altha Harm Rama  Kara [name] is an 29 y.o. female.  HPI: Pt was hospitalized on 39/03/00 with alcoholic pancreatitis, and splenic vein thrombosis.  She was sent home yesterday with Lovenox and coumadin for bridging anticoagulation therapy.  Pain was controlled.  After discharge home she returned to ED by EMS with acute lower abdominal, and lower back pain that was new and not like her pancreatitis.  Her BP 166/138, HR 100, RR 20. Pain rated 10/10. Work up shows some LFT' elevation, WBC 15.7, INR 2.29.  Transvaginal US were negative for ovarian torsion on right, left ovary not found.  CT scan shows fluid collections left mid abdomen, and LLQ 13.7 x 10.0 x 7.4 cm tracking inferiorly from the pancreatic tail anterior to the left kidney and left psoas musculature suspicious for a hematoma with multiple small foci of active bleeding. New retroperitoneal dense soft tissue inflammation noted about the level of the aortic bifurcation, measuring 7.5 x 5.2 x 2.9 cm, concerning for extension of soft tissue inflammation into the inferior retroperitoneum.  New evolving pseudocyst tail of pancrease, measuring up to 5.2 cm. evolving pseudocyst at the fundus of the stomach, measuring 4.0 cm, is relatively stable. We are ask to see.   BP down this AM, HR 131, afebrile, AM labs are pending.  She is getting FFP now.  She continues to have pain, her BP is down and her HR is up.  H/H is being drawn now.  Past Medical History  Diagnosis Date  . Asthma   . GERD (gastroesophageal reflux disease)   . Alcohol abuse   . Environmental allergies   . Headache   . Hypertension   . Syncope and collapse   . Vision abnormalities   . Prolonged QT syndrome     Past Surgical History  Procedure Laterality Date  . Tympanostomy tube placement    . Esophagogastroduodenoscopy N/A 07/17/2014    Procedure: ESOPHAGOGASTRODUODENOSCOPY (EGD);   Surgeon: Teena Irani, MD;  Location: [facility];  Service: Endoscopy;  Laterality: N/A;    Family History  Problem Relation Age of Onset  . Heart failure Mother   . Heart failure Father   . Heart failure Maternal Grandfather     Social History:  reports that she quit smoking about 2 years ago. Her smoking use included Cigarettes. She has a 1 pack-year smoking history. She has never used smokeless tobacco. She reports that she drinks about 1.8 oz of alcohol per week. She reports that she does not use illicit drugs.  Allergies:  Allergies  Allergen Reactions  . Peanuts [Peanut Oil] Itching    Sensitivity     Medications:  Prior to Admission:  Prescriptions prior to admission  Medication Sig Dispense Refill Last Dose  . albuterol (PROVENTIL HFA;VENTOLIN HFA) 108 (90 BASE) MCG/ACT inhaler Inhale 2 puffs into the lungs every 6 (six) hours as needed for wheezing or shortness of breath (wheezing and sob).    unknown  . cefUROXime (CEFTIN) 500 MG tablet Take 1 tablet (500 mg total) by mouth 2 (two) times daily with a meal. For 4days 8 tablet 0 02/09/2015 at 0845  . fexofenadine (ALLEGRA) 180 MG tablet Take 180 mg by mouth daily as needed for allergies (allergies).    unknown  . gabapentin (NEURONTIN) 300 MG capsule One in am, one in evening and two po at night (Patient taking differently: Take 300-600 mg by mouth 3 (three) times daily. One in am,  one in evening and two po at night) 120 capsule 11 02/08/2015 at 2233  . metoprolol succinate (TOPROL-XL) 25 MG 24 hr tablet Take 1 tablet (25 mg total) by mouth daily. 30 tablet 11 02/09/2015 at 1031  . metroNIDAZOLE (FLAGYL) 500 MG tablet Take 1 tablet (500 mg total) by mouth every 8 (eight) hours. For 4days 12 tablet 0 02/09/2015 at 0523  . mometasone-formoterol (DULERA) 200-5 MCG/ACT AERO Inhale 2 puffs into the lungs 2 (two) times daily.   02/09/2015 at 0838  . montelukast (SINGULAIR) 10 MG tablet Take 10 mg by mouth daily.    02/09/2015 at 1031   . Multiple Vitamin (MULTIVITAMIN WITH MINERALS) TABS tablet Take 1 tablet by mouth daily. 30 tablet 0 02/09/2015 at 1031  . oxyCODONE (OXY IR/ROXICODONE) 5 MG immediate release tablet Take 1 tablet (5 mg total) by mouth every 6 (six) hours as needed for severe pain. 30 tablet 0 02/09/2015 at Unknown time  . potassium chloride SA (K-DUR,KLOR-CON) 20 MEQ tablet Take 1 tablet (20 mEq total) by mouth daily. Take while on Lasix 5 tablet 0 02/09/2015 at 1032  . furosemide (LASIX) 20 MG tablet Take 1 tablet (20 mg total) by mouth 2 (two) times daily. For 3-4days, for leg swelling (Patient not taking: Reported on 02/09/2015) 10 tablet 0 Not Taking at Unknown time  . ondansetron (ZOFRAN) 4 MG tablet Take 1 tablet (4 mg total) by mouth every 8 (eight) hours as needed for nausea or vomiting. (Patient not taking: Reported on 02/09/2015) 20 tablet 0 Not Taking at Unknown time  . polyethylene glycol (MIRALAX / GLYCOLAX) packet Take 17 g by mouth daily as needed. (Patient not taking: Reported on 10/29/2014) 14 each 0 Not Taking at Unknown time  . warfarin (COUMADIN) 5 MG tablet Take 1 tablet (5 mg total) by mouth daily. Dose may be adjusted based on INR check Wednesday (Patient not taking: Reported on 02/09/2015) 30 tablet 0 Not Taking   Scheduled: . cefUROXime  500 mg Oral BID WC  . gabapentin  300 mg Oral BID WC  . gabapentin  600 mg Oral QHS  . loratadine  10 mg Oral Daily  . metroNIDAZOLE  500 mg Oral 3 times per day  . montelukast  10 mg Oral Daily  . multivitamin with minerals  1 tablet Oral Daily  . pantoprazole  40 mg Oral Daily   Continuous: . lactated ringers 100 mL/hr at 02/10/15 0756   JXB:JYNWGNFAOZHYQ **OR** acetaminophen, alum & mag hydroxide-simeth, HYDROmorphone (DILAUDID) injection, ondansetron **OR** ondansetron (ZOFRAN) IV, oxyCODONE Anti-infectives    Start     Dose/Rate Route Frequency Ordered Stop   02/10/15 0800  cefUROXime (CEFTIN) tablet 500 mg     500 mg Oral 2 times daily with  meals 02/10/15 0102     02/10/15 0115  metroNIDAZOLE (FLAGYL) tablet 500 mg     500 mg Oral 3 times per day 02/10/15 0102        Results for orders placed or performed during the hospital encounter of 02/09/15 (from the past 48 hour(s))  Urinalysis, Routine w reflex microscopic (not at [facility])     Status: Abnormal   Collection Time: 02/09/15  8:16 PM  Result Value Ref Range   Color, Urine YELLOW YELLOW   APPearance CLEAR CLEAR   Specific Gravity, Urine 1.013 1.005 - 1.030   pH 7.5 5.0 - 8.0   Glucose, UA NEGATIVE NEGATIVE mg/dL   Hgb urine dipstick TRACE (A) NEGATIVE   Bilirubin Urine NEGATIVE NEGATIVE  Ketones, ur 40 (A) NEGATIVE mg/dL   Protein, ur NEGATIVE NEGATIVE mg/dL   Urobilinogen, UA 0.2 0.0 - 1.0 mg/dL   Nitrite NEGATIVE NEGATIVE   Leukocytes, UA NEGATIVE NEGATIVE  Pregnancy, urine     Status: None   Collection Time: 02/09/15  8:16 PM  Result Value Ref Range   Preg Test, Ur NEGATIVE NEGATIVE    Comment:        THE SENSITIVITY OF THIS METHODOLOGY IS >20 mIU/mL.   Urine microscopic-add on     Status: Abnormal   Collection Time: 02/09/15  8:16 PM  Result Value Ref Range   Squamous Epithelial / LPF FEW (A) RARE   WBC, UA 3-6 <3 WBC/hpf   RBC / HPF 0-2 <3 RBC/hpf   Bacteria, UA RARE RARE  CBC with Differential/Platelet     Status: Abnormal   Collection Time: 02/09/15  8:40 PM  Result Value Ref Range   WBC 15.7 (H) 4.0 - 10.5 K/uL   RBC 3.67 (L) 3.87 - 5.11 MIL/uL   Hemoglobin 11.7 (L) 12.0 - 15.0 g/dL   HCT 36.3 36.0 - 46.0 %   MCV 98.9 78.0 - 100.0 fL   MCH 31.9 26.0 - 34.0 pg   MCHC 32.2 30.0 - 36.0 g/dL   RDW 14.5 11.5 - 15.5 %   Platelets 412 (H) 150 - 400 K/uL   Neutrophils Relative % 86 %   Neutro Abs 13.4 (H) 1.7 - 7.7 K/uL   Lymphocytes Relative 9 %   Lymphs Abs 1.4 0.7 - 4.0 K/uL   Monocytes Relative 5 %   Monocytes Absolute 0.8 0.1 - 1.0 K/uL   Eosinophils Relative 0 %   Eosinophils Absolute 0.0 0.0 - 0.7 K/uL   Basophils Relative 1 %    Basophils Absolute 0.1 0.0 - 0.1 K/uL  Comprehensive metabolic panel     Status: Abnormal   Collection Time: 02/09/15  8:40 PM  Result Value Ref Range   Sodium 136 135 - 145 mmol/L   Potassium 4.4 3.5 - 5.1 mmol/L   Chloride 101 101 - 111 mmol/L   CO2 25 22 - 32 mmol/L   Glucose, Bld 130 (H) 65 - 99 mg/dL   BUN <5 (L) 6 - 20 mg/dL   Creatinine, Ser 0.50 0.44 - 1.00 mg/dL   Calcium 8.7 (L) 8.9 - 10.3 mg/dL   Total Protein 6.3 (L) 6.5 - 8.1 g/dL   Albumin 2.5 (L) 3.5 - 5.0 g/dL   AST 50 (H) 15 - 41 U/L   ALT 25 14 - 54 U/L   Alkaline Phosphatase 200 (H) 38 - 126 U/L   Total Bilirubin 1.6 (H) 0.3 - 1.2 mg/dL   GFR calc non Af Amer >60 >60 mL/min   GFR calc Af Amer >60 >60 mL/min    Comment: (NOTE) The eGFR has been calculated using the CKD EPI equation. This calculation has not been validated in all clinical situations. eGFR's persistently <60 mL/min signify possible Chronic Kidney Disease.    Anion gap 10 5 - 15  Lipase, blood     Status: None   Collection Time: 02/09/15  8:40 PM  Result Value Ref Range   Lipase 43 11 - 51 U/L    Comment: Please note change in reference range.  Wet prep, genital     Status: None   Collection Time: 02/09/15  9:13 PM  Result Value Ref Range   Yeast Wet Prep HPF POC NONE SEEN NONE SEEN   Trich, Wet  Prep NONE SEEN NONE SEEN   Clue Cells Wet Prep HPF POC NONE SEEN NONE SEEN   WBC, Wet Prep HPF POC NONE SEEN NONE SEEN  Protime-INR     Status: Abnormal   Collection Time: 02/09/15  9:32 PM  Result Value Ref Range   Prothrombin Time 25.0 (H) 11.6 - 15.2 seconds   INR 2.29 (H) 0.00 - 1.49  Type and screen [place]     Status: None   Collection Time: 02/10/15 12:13 AM  Result Value Ref Range   ABO/RH(D) O NEG    Antibody Screen NEG    Sample Expiration 02/13/2015   Prepare fresh frozen plasma     Status: None (Preliminary result)   Collection Time: 02/10/15 12:13 AM  Result Value Ref Range   Unit Number [id number]     Blood Component Type THAWED PLASMA    Unit division 00    Status of Unit ALLOCATED    Transfusion Status OK TO TRANSFUSE    Unit Number [id number]    Blood Component Type THAWED PLASMA    Unit division 00    Status of Unit ALLOCATED    Transfusion Status OK TO TRANSFUSE    Unit Number [id number]    Blood Component Type THAWED PLASMA    Unit division 00    Status of Unit ALLOCATED    Transfusion Status OK TO TRANSFUSE    Unit Number [id number]    Blood Component Type THAWED PLASMA    Unit division 00    Status of Unit ALLOCATED    Transfusion Status OK TO TRANSFUSE     US Transvaginal Non-ob  02/09/2015  ADDENDUM REPORT: 02/09/2015 23:43 ADDENDUM: The Doppler findings should read: Pulsed Doppler evaluation of the right ovary demonstrates normal low-resistance arterial and venous waveforms. The left ovary is not visualized on this study. Subsequent CT demonstrates a grossly unremarkable appearance to both ovaries. Electronically Signed   By: Garald Balding M.D.   On: 02/09/2015 23:43  02/09/2015  CLINICAL DATA:  Acute onset of pelvic pain.  Initial encounter. EXAM: TRANSABDOMINAL AND TRANSVAGINAL ULTRASOUND OF PELVIS DOPPLER ULTRASOUND OF OVARIES TECHNIQUE: Both transabdominal and transvaginal ultrasound examinations of the pelvis were performed. Transabdominal technique was performed for global imaging of the pelvis including uterus, ovaries, adnexal regions, and pelvic cul-de-sac. It was necessary to proceed with endovaginal exam following the transabdominal exam to visualize the uterus and ovaries in greater detail. Color and duplex Doppler ultrasound was utilized to evaluate blood flow to the ovaries. COMPARISON:  CT of the abdomen and pelvis from 02/01/2015 FINDINGS: Uterus Measurements: 4.6 x 2.0 x 2.6 cm. No fibroids or other mass visualized. Endometrium Thickness: 0.2 cm.  No focal abnormality visualized. Right ovary Measurements: 2.9 x 2.2 x 2.7 cm. Normal appearance/no  adnexal mass. Left ovary Not visualized. Pulsed Doppler evaluation of both ovaries demonstrates normal low-resistance arterial and venous waveforms. Other findings A small amount of free fluid is seen within the pelvic cul-de-sac. IMPRESSION: Unremarkable pelvic ultrasound. No evidence for ovarian torsion. The left ovary is not visualized on this study. Electronically Signed: By: Garald Balding M.D. On: 02/09/2015 23:08   US Pelvis Complete  02/09/2015  ADDENDUM REPORT: 02/09/2015 23:43 ADDENDUM: The Doppler findings should read: Pulsed Doppler evaluation of the right ovary demonstrates normal low-resistance arterial and venous waveforms. The left ovary is not visualized on this study. Subsequent CT demonstrates a grossly unremarkable appearance to both ovaries. Electronically Signed   By: Francoise Schaumann.D.  On: 02/09/2015 23:43  02/09/2015  CLINICAL DATA:  Acute onset of pelvic pain.  Initial encounter. EXAM: TRANSABDOMINAL AND TRANSVAGINAL ULTRASOUND OF PELVIS DOPPLER ULTRASOUND OF OVARIES TECHNIQUE: Both transabdominal and transvaginal ultrasound examinations of the pelvis were performed. Transabdominal technique was performed for global imaging of the pelvis including uterus, ovaries, adnexal regions, and pelvic cul-de-sac. It was necessary to proceed with endovaginal exam following the transabdominal exam to visualize the uterus and ovaries in greater detail. Color and duplex Doppler ultrasound was utilized to evaluate blood flow to the ovaries. COMPARISON:  CT of the abdomen and pelvis from 02/01/2015 FINDINGS: Uterus Measurements: 4.6 x 2.0 x 2.6 cm. No fibroids or other mass visualized. Endometrium Thickness: 0.2 cm.  No focal abnormality visualized. Right ovary Measurements: 2.9 x 2.2 x 2.7 cm. Normal appearance/no adnexal mass. Left ovary Not visualized. Pulsed Doppler evaluation of both ovaries demonstrates normal low-resistance arterial and venous waveforms. Other findings A small amount of free  fluid is seen within the pelvic cul-de-sac. IMPRESSION: Unremarkable pelvic ultrasound. No evidence for ovarian torsion. The left ovary is not visualized on this study. Electronically Signed: By: Garald Balding M.D. On: 02/09/2015 23:08   Ct Abdomen Pelvis W Contrast  02/09/2015  CLINICAL DATA:  Acute onset of pelvic and lower abdominal pain. Lower back pain. Initial encounter. EXAM: CT ABDOMEN AND PELVIS WITH CONTRAST TECHNIQUE: Multidetector CT imaging of the abdomen and pelvis was performed using the standard protocol following bolus administration of intravenous contrast. CONTRAST:  154m OMNIPAQUE IOHEXOL 300 MG/ML  SOLN COMPARISON:  CT of the abdomen and pelvis performed 02/01/2015, and pelvic ultrasound performed earlier today at 10:21 p.m. FINDINGS: A small left pleural effusion is noted, with bibasilar atelectasis. Since the prior study, there has been interval development of a large heterogeneous 13.7 x 10.0 x 7.4 cm collection of fluid at the left mid abdomen and left lower quadrant, tracking inferiorly from the pancreatic tail, with multiple small foci of high attenuation. It is noted anterior to the left kidney and left psoas musculature. This is highly suspicious for a new intra-abdominal hematoma, with multiple small foci of contrast extravasation, reflecting active bleeding. This is a very unusual appearance for hemorrhagic pancreatitis. New retroperitoneal dense soft tissue inflammation is also noted about the level of the aortic bifurcation, measuring 7.5 x 5.2 x 2.9 cm, concerning for extension of soft tissue inflammation into the inferior retroperitoneum. New evolving pseudocysts are noted about the pancreatic tail, measuring up to 5.2 cm in size. One of these fluid collections extends adjacent to small bowel loops. Previously noted soft tissue inflammation about the head and body of the pancreas has improved. There is no evidence of devascularization. There is diffuse fatty infiltration  within the liver. The spleen is unremarkable in appearance, though inflammatory fluid is seen tracking about the spleen. The gallbladder is grossly unremarkable. The adrenal glands are grossly unremarkable, though the left adrenal gland is difficult to fully assess due to surrounding fluid. The kidneys are unremarkable in appearance. There is no evidence of hydronephrosis. No renal or ureteral stones are seen. No perinephric stranding is appreciated. Minimal left-sided perinephric fluid likely reflects the overlying hematoma. The small bowel is unremarkable in appearance. A vague evolving pseudocyst is noted at the fundus of the stomach, similar in appearance to the prior study, measuring approximately 4.0 cm. The stomach is otherwise grossly unremarkable in appearance. The appendix is normal in caliber, without evidence of appendicitis. The colon is decompressed and not well assessed, but appears grossly  unremarkable. The bladder is decompressed. Somewhat complex fluid is noted tracking within the pelvis, likely extending from the patient's pancreatitis. The uterus is unremarkable in appearance. The ovaries are grossly symmetric. No suspicious adnexal masses are seen. No inguinal lymphadenopathy is seen. No acute osseous abnormalities are identified. IMPRESSION: 1. Interval development of a large heterogeneous 13.7 x 10.0 x 7.4 cm collection of fluid at the left mid abdomen and left lower quadrant, tracking inferiorly from the pancreatic tail anterior to the left kidney and left psoas musculature. Multiple small foci of high attenuation noted within the collection. This is highly suspicious for a new intra-abdominal hematoma, with multiple small foci of active bleeding. This may reflect pancreatic enzymes eroding into tiny surrounding vessels, given that the patient is on anticoagulation. This is a very unusual appearance for hemorrhagic pancreatitis. 2. Additional new retroperitoneal dense soft tissue inflammation  noted about the level of the aortic bifurcation, measuring 7.5 x 5.2 x 2.9 cm, concerning for extension of soft tissue inflammation into the inferior retroperitoneum. 3. New evolving pseudocysts about the pancreatic tail, measuring up to 5.2 cm in size. One of these fluid collections extends adjacent to small bowel loops, though the small bowel is unremarkable in appearance. Soft tissue inflammation about the head and body of the pancreas has improved. No evidence of devascularization. 4. 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 Complex fluid tracks about the spleen. 5. Somewhat complex fluid noted tracking within the pelvis, likely extending from the patient's pancreatitis. 6. Small left pleural effusion noted, with bibasilar atelectasis. 7. Diffuse fatty infiltration within the liver. Critical Value/emergent results were called by telephone at the time of interpretation on 02/09/2015 at 11:35 pm to Dr. Ezequiel Essex, who verbally acknowledged these results. Electronically Signed   By: Garald Balding M.D.   On: 02/09/2015 23:57   [place] Art/ven Flow Abd Pelv Doppler  02/09/2015  ADDENDUM REPORT: 02/09/2015 23:43 ADDENDUM: The Doppler findings should read: Pulsed Doppler evaluation of the right ovary demonstrates normal low-resistance arterial and venous waveforms. The left ovary is not visualized on this study. Subsequent CT demonstrates a grossly unremarkable appearance to both ovaries. Electronically Signed   By: Garald Balding M.D.   On: 02/09/2015 23:43  02/09/2015  CLINICAL DATA:  Acute onset of pelvic pain.  Initial encounter. EXAM: TRANSABDOMINAL AND TRANSVAGINAL ULTRASOUND OF PELVIS DOPPLER ULTRASOUND OF OVARIES TECHNIQUE: Both transabdominal and transvaginal ultrasound examinations of the pelvis were performed. Transabdominal technique was performed for global imaging of the pelvis including uterus, ovaries, adnexal regions,  and pelvic cul-de-sac. It was necessary to proceed with endovaginal exam following the transabdominal exam to visualize the uterus and ovaries in greater detail. Color and duplex Doppler ultrasound was utilized to evaluate blood flow to the ovaries. COMPARISON:  CT of the abdomen and pelvis from 02/01/2015 FINDINGS: Uterus Measurements: 4.6 x 2.0 x 2.6 cm. No fibroids or other mass visualized. Endometrium Thickness: 0.2 cm.  No focal abnormality visualized. Right ovary Measurements: 2.9 x 2.2 x 2.7 cm. Normal appearance/no adnexal mass. Left ovary Not visualized. Pulsed Doppler evaluation of both ovaries demonstrates normal low-resistance arterial and venous waveforms. Other findings A small amount of free fluid is seen within the pelvic cul-de-sac. IMPRESSION: Unremarkable pelvic ultrasound. No evidence for ovarian torsion. The left ovary is not visualized on this study. Electronically Signed: By: Garald Balding M.D. On: 02/09/2015 23:08   Dg Chest Portable 1 View  02/09/2015  CLINICAL  DATA:  29 year old female with severe lower abdominal pain and shortness of breath. EXAM: PORTABLE CHEST 1 VIEW COMPARISON:  Chest x-ray 01/31/2015. FINDINGS: Small bilateral pleural effusions (left greater than right). Bibasilar opacities favored to predominantly reflect subsegmental atelectasis (underlying airspace consolidation in the left lower lobe is not excluded). Mid to upper lungs are clear. Pulmonary venous congestion, without frank pulmonary edema. Heart size appears borderline enlarged. Upper mediastinal contours are slightly distorted by patient positioning. IMPRESSION: 1. Small bilateral pleural effusions (left greater than right) with probable bibasilar subsegmental atelectasis. 2. Borderline cardiomegaly with mild pulmonary venous congestion, but no frank pulmonary edema. Electronically Signed   By: Vinnie Langton M.D.   On: 02/09/2015 21:10    Review of Systems  Constitutional: Negative.   HENT: Negative.    Eyes: Negative.   Respiratory: Negative.   Gastrointestinal: Positive for abdominal pain, diarrhea and blood in stool (she had some diarrhea this AM with possible blood in stool).  Genitourinary: Negative.   Musculoskeletal: Positive for back pain.  Skin: Negative.   Neurological: Positive for dizziness (This Am getting up to BR).  Endo/Heme/Allergies: Bruises/bleeds easily.  Psychiatric/Behavioral: The patient is nervous/anxious.    Blood pressure 69/38, pulse 131, temperature 98.2 F (36.8 C), temperature source Oral, resp. rate 25, last menstrual period 12/30/2014, SpO2 98 %. Physical Exam  Constitutional: She is oriented to person, place, and time. She appears well-developed and well-nourished. No distress.  Very anxious, BP down to 66, HR 130 range.  HENT:  Head: Normocephalic and atraumatic.  Nose: Nose normal.  Eyes: Conjunctivae and EOM are normal. Right eye exhibits no discharge. Left eye exhibits no discharge. No scleral icterus.  Neck: Normal range of motion. Neck supple. No JVD present. No tracheal deviation present. No thyromegaly present.  Cardiovascular: Regular rhythm, normal heart sounds and intact distal pulses.   No murmur heard. Respiratory: Effort normal and breath sounds normal. No respiratory distress. She has no wheezes. She has no rales. She exhibits no tenderness.  GI: She exhibits no distension and no mass. There is tenderness. There is no rebound and no guarding.  She is tender all over, more on the left than the right..  Pain goes to her back.  Musculoskeletal: She exhibits edema (both lower legs with edema).  Lymphadenopathy:    She has no cervical adenopathy.  Neurological: She is alert and oriented to person, place, and time. No cranial nerve deficit.  Skin: Skin is dry. No rash noted. She is not diaphoretic. No erythema. No pallor.  Psychiatric: She has a normal mood and affect. Her behavior is normal. Judgment and thought content normal.  She is  hurting and scared.    Assessment/Plan: Alcoholic pancreatitis/splenic Vein thrombosis/pancreatic pseudocyst - on Coumadin/Lovenox bridge Intraabdominal bleed on Coumadin Acute alcoholic pancreatitis Hypertension   Plan:  Agree with stopping anticoagulation, reversal of INR, she may need blood, with BP down, H/H is being obtained now.  No immediate surgery.  If she is still bleeding she will need an arteriogram and possible IR intervention.  We will follow with you.  She is a hard stick and may need a PICC line too.  Dr. Marlou Starks is seeing her now, he has reviewed the CT scan and agrees. She may need CCM involvement also.  , 02/10/2015, 8:06 AM

## 2015-02-10 NOTE — Consult Note (Signed)
PULMONARY / CRITICAL CARE MEDICINE   Name: Kara [name] MRN: [id number] DOB: Mar 16, 1986    ADMISSION DATE:  02/09/2015 CONSULTATION DATE:  02/10/15  REFERRING MD :  Dr. 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  CTA of abdomen demonstrated concern for new intra-abdominal hematoma, inflammation and evolving pseudocysts.  Developed hypotension and PCCM consulted for evaluation of hypotension.    STUDIES:  10/23  CTA ABD/Pelvis >> interval development of a large heterogenous 13.7x10x7.4 cm fluid collection in L mid abdomen/LLQ tracking inferiorly from teh pancreatitic tail anterior to the left kidney & L psoas muscle concerning for intra-abdominal hematoma, multiple small areas of active bleeding.  Additional new retroperitoneal dense soft tissue inflammation at the level of the aortic bifurcation 7.5x5.2x2.9 cm concerning for extension of soft tissue inflammation into the inferior retroperitoneum.  New evolving pseudocysts in pancreatic tail up to 5.2 cm in size.  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    The patient reported 6 hours after discharge she developed intense abdominal pain, nausea/vomiting and bilateral lower extremity swelling. CTA of abdomen demonstrated concern for new intra-abdominal hematoma, inflammation and evolving pseudocysts. She reported she had picked up her new prescriptions but had not had the opportunity to take them as prescribed. She activated EMS and was transported to the [facility] ER for further evaluation. On arrival she reported she had taken prescribed oxycodone for pain without relief. He described the pain is different from her pancreatitis-type pain.  ER workup notable for negative urinalysis and documented benign pelvic exam. Vaginal ultrasound ruled out ovarian torsion. She was noted to be tachycardic with stable blood pressure and hemoglobin.  CT of the abdomen was evaluated which was concerning for interval development of a large heterogenous 13.7x10x7.4 cm fluid collection in L mid abdomen/LLQ tracking inferiorly from teh pancreatitic tail anterior to the left kidney & L psoas muscle concerning for intra-abdominal hematoma, multiple small areas of active bleeding.  Additional new retroperitoneal dense soft tissue inflammation at the level of the aortic bifurcation 7.5x5.2x2.9 cm concerning for extension of soft tissue inflammation into the inferior retroperitoneum.  New evolving pseudocysts in pancreatic tail up to 5.2 cm in size.  Evolving pseudocyst at the fundus of the stomach with complex fluid tracking about the spleen, complex fluid in the pelvis, small L pleural effusion, diffuse fatty infiltration of the liver.  INR on admission was 2.3. She is treated with [facility].  The patient was admitted per [facility] for further care. The morning of 10/24 she developed hypotension and PCCM was consulted for evaluation.  The patient had been treated with a 500 mL bolus with improvement in blood pressure. She also  was evaluated by  [facility] with recommendations for INR reversal, follow-up lab work, and possible IR evaluation.  The patient currently reports sensation of feeling like she would pass out when she ambulated to the bathroom. She describes the event as her hearing "narrowing" but then it passed.  She reports abdominal pain/discomfort and feels like her abdomen is tight. She also reports blood clots in her stool.  PAST MEDICAL HISTORY :   has a past medical history of Asthma; GERD (gastroesophageal reflux disease); Alcohol abuse; Environmental allergies; Headache; Hypertension; Syncope and collapse; Vision abnormalities; and Prolonged QT syndrome.  has past surgical history that includes Tympanostomy tube placement and Esophagogastroduodenoscopy (N/A, 07/17/2014).    Prior to Admission medications   Medication Sig Start Date End Date Taking? Authorizing Provider  albuterol (PROVENTIL HFA;VENTOLIN HFA) 108 (90 BASE) MCG/ACT inhaler Inhale 2 puffs into the lungs every 6 (six) hours as needed for wheezing or shortness of breath (wheezing and sob).    Yes Historical Provider, MD  cefUROXime (CEFTIN) 500 MG tablet Take 1 tablet (500 mg total) by mouth 2 (two) times daily with a meal. For 4days 02/09/15  Yes Zannie Cove, MD  fexofenadine (ALLEGRA) 180 MG tablet Take 180 mg by mouth daily as needed for allergies (allergies).    Yes Historical Provider, MD  gabapentin (NEURONTIN) 300 MG capsule One in am, one in evening and two po at night Patient taking differently: Take 300-600 mg by mouth 3 (three) times daily. One in am, one in evening and two po at night 09/02/14  Yes Asa Lente, MD  metoprolol succinate (TOPROL-XL) 25 MG 24 hr tablet Take 1 tablet (25 mg total) by mouth daily. 06/17/14  Yes Wendall Stade, MD  metroNIDAZOLE (FLAGYL) 500 MG tablet Take 1 tablet (500 mg total) by mouth every 8 (eight) hours. For 4days 02/09/15  Yes Zannie Cove, MD  mometasone-formoterol [facility]) 200-5 MCG/ACT AERO  Inhale 2 puffs into the lungs 2 (two) times daily.   Yes Historical Provider, MD  montelukast (SINGULAIR) 10 MG tablet Take 10 mg by mouth daily.    Yes Historical Provider, MD  Multiple Vitamin (MULTIVITAMIN WITH MINERALS) TABS tablet Take 1 tablet by mouth daily. 07/19/14  Yes Belkys A Regalado, MD  oxyCODONE (OXY IR/ROXICODONE) 5 MG immediate release tablet Take 1 tablet (5 mg total) by mouth every 6 (six) hours as needed for severe pain. 02/09/15  Yes Zannie Cove, MD  potassium chloride SA (K-DUR,KLOR-CON) 20 MEQ tablet Take 1 tablet (20 mEq total) by mouth daily. Take while on Lasix 02/09/15  Yes Zannie Cove, MD  furosemide (LASIX) 20 MG tablet Take 1 tablet (20 mg total) by mouth 2 (two) times daily. For 3-4days, for leg swelling Patient not taking: Reported on 02/09/2015 02/09/15   Zannie Cove, MD  ondansetron (ZOFRAN) 4 MG tablet Take 1 tablet (4 mg total) by mouth every 8 (eight) hours as needed for nausea or vomiting. Patient not taking: Reported on 02/09/2015 02/09/15   Zannie Cove, MD  polyethylene glycol [facility] / Ethelene Hal) packet Take 17 g by mouth daily as needed. Patient not taking: Reported on 10/29/2014 07/19/14   Belkys A Regalado, MD  warfarin (COUMADIN) 5 MG tablet Take 1 tablet (5 mg total) by mouth daily. Dose may be adjusted based on INR check Wednesday Patient not taking: Reported on 02/09/2015 02/09/15   Zannie Cove, MD   Allergies  Allergen Reactions  . Peanuts [Peanut Oil] Itching    Sensitivity  FAMILY HISTORY:  indicated that her mother is alive. She indicated that her father is deceased.    SOCIAL HISTORY:  reports that she quit smoking about 2 years ago. Her smoking use included Cigarettes. She has a 1 pack-year smoking history. She has never used smokeless tobacco. She reports that she drinks about 1.8 oz of alcohol per week. She reports that she does not use illicit drugs.  REVIEW OF SYSTEMS:   Gen: Denies fever, chills, weight change,  fatigue, night sweats HEENT: Denies blurred vision, double vision, hearing loss, tinnitus, sinus congestion, rhinorrhea, sore throat, neck stiffness, dysphagia PULM: Denies shortness of breath, cough, sputum production, hemoptysis, wheezing CV: Denies chest pain, edema, orthopnea, paroxysmal nocturnal dyspnea, palpitations.  Reports sensation of feeling like she would pass out when she got up to the bathroom. GI: Denies diarrhea, constipation, change in bowel habits.  Reports abdominal pain, nausea / vomiting, blood clots in stool GU: Denies dysuria, hematuria, polyuria, oliguria, urethral discharge Endocrine: Denies hot or cold intolerance, polyuria, polyphagia or appetite change Derm: Denies rash, dry skin, scaling or peeling skin change Heme: Denies easy bruising, bleeding, bleeding gums Neuro: Denies headache, numbness, weakness, slurred speech, loss of memory or consciousness   SUBJECTIVE:   VITAL SIGNS: Temp:  [98 F (36.7 C)-99.2 F (37.3 C)] 99.1 F (37.3 C) (10/24 0920) Pulse Rate:  [100-135] 128 (10/24 0920) Resp:  [20-29] 29 (10/24 0920) BP: (66-136)/(38-94) 89/63 mmHg (10/24 0920) SpO2:  [93 %-98 %] 96 % (10/24 0920)   HEMODYNAMICS:     VENTILATOR SETTINGS:     INTAKE / OUTPUT:  Intake/Output Summary (Last 24 hours) at 02/10/15 0923 Last data filed at 02/10/15 0745  Gross per 24 hour  Intake    632 ml  Output      0 ml  Net    632 ml    PHYSICAL EXAMINATION: General:  Chronically ill appearing female, appears uncomfortable / nervous Neuro:  Anxious, alert/oriented, MAE, normal strength HEENT:  MM pink/dry, no jvd, round face Cardiovascular:  s1s2 rrr, no m/r/g, tachy  Lungs:  Shallow, non-labored, lungs bilaterally diminished but clear  Abdomen:  Obese/soft, tender to palpation Musculoskeletal:  No acute deformities  Skin:  Warm/dry, 1-2+ pitting edema in BLE  LABS:  CBC  Recent Labs Lab 02/09/15 0512 02/09/15 2040 02/10/15 0157  WBC 11.2* 15.7*  17.0*  HGB 11.4* 11.7* 11.2*  HCT 35.5* 36.3 34.1*  PLT 363 412* 403*   Coag's  Recent Labs Lab 02/09/15 0512 02/09/15 2132 02/10/15 0157  INR 2.69* 2.29* 2.17*   BMET  Recent Labs Lab 02/09/15 0512 02/09/15 2040 02/10/15 0157  NA 139 136 135  K 4.5 4.4 4.2  CL 103 101 102  CO2 28 25 22   BUN <5* <5* <5*  CREATININE 0.57 0.50 0.56  GLUCOSE 115* 130* 142*   Electrolytes  Recent Labs Lab 02/04/15 0805  02/09/15 0512 02/09/15 2040 02/10/15 0157  CALCIUM 8.2*  < > 8.6* 8.7* 8.2*  MG 2.0  --   --   --   --   < > = values in this interval not displayed.   Sepsis Markers No results for input(s): LATICACIDVEN, PROCALCITON, O2SATVEN in the last 168 hours.   ABG No results for input(s): PHART, PCO2ART, PO2ART in the last 168 hours.   Liver Enzymes  Recent Labs Lab 02/08/15 0550 02/09/15 0512 02/09/15 2040  AST 74* 53* 50*  ALT 31 26 25   ALKPHOS 217* 198* 200*  BILITOT 1.3* 1.1 1.6*  ALBUMIN 2.3* 2.4* 2.5*   Cardiac Enzymes No results for input(s): TROPONINI, PROBNP in the last 168 hours.   Glucose No results for input(s): GLUCAP in the last 168 hours.  Imaging US Transvaginal Non-ob  02/09/2015  ADDENDUM REPORT: 02/09/2015 23:43 ADDENDUM: The Doppler findings should read: Pulsed Doppler evaluation of the right ovary demonstrates normal low-resistance arterial and venous waveforms. The left ovary is not visualized on this study. Subsequent CT demonstrates a grossly unremarkable appearance to both ovaries. Electronically Signed   By: Roanna Raider M.D.   On: 02/09/2015 23:43  02/09/2015  CLINICAL DATA:  Acute onset of pelvic pain.  Initial encounter. EXAM: TRANSABDOMINAL AND TRANSVAGINAL ULTRASOUND OF PELVIS DOPPLER ULTRASOUND OF OVARIES TECHNIQUE: Both transabdominal and transvaginal ultrasound examinations of the pelvis were performed. Transabdominal technique was performed for global imaging of the pelvis including uterus, ovaries, adnexal regions, and  pelvic cul-de-sac. It was necessary to proceed with endovaginal exam following the transabdominal exam to visualize the uterus and ovaries in greater detail. Color and duplex Doppler ultrasound was utilized to evaluate blood flow to the ovaries. COMPARISON:  CT of the abdomen and pelvis from 02/01/2015 FINDINGS: Uterus Measurements: 4.6 x 2.0 x 2.6 cm. No fibroids or other mass visualized. Endometrium Thickness: 0.2 cm.  No focal abnormality visualized. Right ovary Measurements: 2.9 x 2.2 x 2.7 cm. Normal appearance/no adnexal mass. Left ovary Not visualized. Pulsed Doppler evaluation of both ovaries demonstrates normal low-resistance arterial and venous waveforms. Other findings A small amount of free fluid is seen within the pelvic cul-de-sac. IMPRESSION: Unremarkable pelvic ultrasound. No evidence for ovarian torsion. The left ovary is not visualized on this study. Electronically Signed: By: Roanna Raider M.D. On: 02/09/2015 23:08   US Pelvis Complete  02/09/2015  ADDENDUM REPORT: 02/09/2015 23:43 ADDENDUM: The Doppler findings should read: Pulsed Doppler evaluation of the right ovary demonstrates normal low-resistance arterial and venous waveforms. The left ovary is not visualized on this study. Subsequent CT demonstrates a grossly unremarkable appearance to both ovaries. Electronically Signed   By: Roanna Raider M.D.   On: 02/09/2015 23:43  02/09/2015  CLINICAL DATA:  Acute onset of pelvic pain.  Initial encounter. EXAM: TRANSABDOMINAL AND TRANSVAGINAL ULTRASOUND OF PELVIS DOPPLER ULTRASOUND OF OVARIES TECHNIQUE: Both transabdominal and transvaginal ultrasound examinations of the pelvis were performed. Transabdominal technique was performed for global imaging of the pelvis including uterus, ovaries, adnexal regions, and pelvic cul-de-sac. It was necessary to proceed with endovaginal exam following the transabdominal exam to visualize the uterus and ovaries in greater detail. Color and duplex Doppler  ultrasound was utilized to evaluate blood flow to the ovaries. COMPARISON:  CT of the abdomen and pelvis from 02/01/2015 FINDINGS: Uterus Measurements: 4.6 x 2.0 x 2.6 cm. No fibroids or other mass visualized. Endometrium Thickness: 0.2 cm.  No focal abnormality visualized. Right ovary Measurements: 2.9 x 2.2 x 2.7 cm. Normal appearance/no adnexal mass. Left ovary Not visualized. Pulsed Doppler evaluation of both ovaries demonstrates normal low-resistance arterial and venous waveforms. Other findings A small amount of free fluid is seen within the pelvic cul-de-sac. IMPRESSION: Unremarkable pelvic ultrasound. No evidence for ovarian torsion. The left ovary is not visualized on this study. Electronically Signed: By: Roanna Raider M.D. On: 02/09/2015 23:08   Ct Abdomen Pelvis W Contrast  02/09/2015  CLINICAL DATA:  Acute onset of pelvic and lower abdominal pain. Lower back pain. Initial encounter. EXAM: CT ABDOMEN AND PELVIS WITH CONTRAST TECHNIQUE: Multidetector CT imaging of the abdomen and pelvis was performed  using the standard protocol following bolus administration of intravenous contrast. CONTRAST:  OMNIPAQUE IOHEXOL 300 MG/ML  SOLN COMPARISON:  CT of the abdomen and pelvis performed 02/01/2015, and pelvic ultrasound performed earlier today at 10:21 p.m. FINDINGS: A small left pleural effusion is noted, with bibasilar atelectasis. Since the prior study, there has been interval development of a large heterogeneous 13.7 x 10.0 x 7.4 cm collection of fluid at the left mid abdomen and left lower quadrant, tracking inferiorly from the pancreatic tail, with multiple small foci of high attenuation. It is noted anterior to the left kidney and left psoas musculature. This is highly suspicious for a new intra-abdominal hematoma, with multiple small foci of contrast extravasation, reflecting active bleeding. This is a very unusual appearance for hemorrhagic pancreatitis. New retroperitoneal dense soft tissue  inflammation is also noted about the level of the aortic bifurcation, measuring 7.5 x 5.2 x 2.9 cm, concerning for extension of soft tissue inflammation into the inferior retroperitoneum. New evolving pseudocysts are noted about the pancreatic tail, measuring up to 5.2 cm in size. One of these fluid collections extends adjacent to small bowel loops. Previously noted soft tissue inflammation about the head and body of the pancreas has improved. There is no evidence of devascularization. There is diffuse fatty infiltration within the liver. The spleen is unremarkable in appearance, though inflammatory fluid is seen tracking about the spleen. The gallbladder is grossly unremarkable. The adrenal glands are grossly unremarkable, though the left adrenal gland is difficult to fully assess due to surrounding fluid. The kidneys are unremarkable in appearance. There is no evidence of hydronephrosis. No renal or ureteral stones are seen. No perinephric stranding is appreciated. Minimal left-sided perinephric fluid likely reflects the overlying hematoma. The small bowel is unremarkable in appearance. A vague evolving pseudocyst is noted at the fundus of the stomach, similar in appearance to the prior study, measuring approximately 4.0 cm. The stomach is otherwise grossly unremarkable in appearance. The appendix is normal in caliber, without evidence of appendicitis. The colon is decompressed and not well assessed, but appears grossly unremarkable. The bladder is decompressed. Somewhat complex fluid is noted tracking within the pelvis, likely extending from the patient's pancreatitis. The uterus is unremarkable in appearance. The ovaries are grossly symmetric. No suspicious adnexal masses are seen. No inguinal lymphadenopathy is seen. No acute osseous abnormalities are identified. IMPRESSION: 1. Interval development of a large heterogeneous 13.7 x 10.0 x 7.4 cm collection of fluid at the left mid abdomen and left lower quadrant,  tracking inferiorly from the pancreatic tail anterior to the left kidney and left psoas musculature. Multiple small foci of high attenuation noted within the collection. This is highly suspicious for a new intra-abdominal hematoma, with multiple small foci of active bleeding. This may reflect pancreatic enzymes eroding into tiny surrounding vessels, given that the patient is on anticoagulation. This is a very unusual appearance for hemorrhagic pancreatitis. 2. Additional new retroperitoneal dense soft tissue inflammation noted about the level of the aortic bifurcation, measuring 7.5 x 5.2 x 2.9 cm, concerning for extension of soft tissue inflammation into the inferior retroperitoneum. 3. New evolving pseudocysts about the pancreatic tail, measuring up to 5.2 cm in size. One of these fluid collections extends adjacent to small bowel loops, though the small bowel is unremarkable in appearance. Soft tissue inflammation about the head and body of the pancreas has improved. No evidence of devascularization. 4. Vague evolving pseudocyst at the fundus of the stomach, measuring 4.0 cm, is relatively stable from  the prior study. Complex fluid tracks about the spleen. 5. Somewhat complex fluid noted tracking within the pelvis, likely extending from the patient's pancreatitis. 6. Small left pleural effusion noted, with bibasilar atelectasis. 7. Diffuse fatty infiltration within the liver. Critical Value/emergent results were called by telephone at the time of interpretation on 02/09/2015 at 11:35 pm to Dr. Glynn Octave, who verbally acknowledged these results. Electronically Signed   By: Roanna Raider M.D.   On: 02/09/2015 23:57   [place] Art/ven Flow Abd Pelv Doppler  02/09/2015  ADDENDUM REPORT: 02/09/2015 23:43 ADDENDUM: The Doppler findings should read: Pulsed Doppler evaluation of the right ovary demonstrates normal low-resistance arterial and venous waveforms. The left ovary is not visualized on this study. Subsequent  CT demonstrates a grossly unremarkable appearance to both ovaries. Electronically Signed   By: Roanna Raider M.D.   On: 02/09/2015 23:43  02/09/2015  CLINICAL DATA:  Acute onset of pelvic pain.  Initial encounter. EXAM: TRANSABDOMINAL AND TRANSVAGINAL ULTRASOUND OF PELVIS DOPPLER ULTRASOUND OF OVARIES TECHNIQUE: Both transabdominal and transvaginal ultrasound examinations of the pelvis were performed. Transabdominal technique was performed for global imaging of the pelvis including uterus, ovaries, adnexal regions, and pelvic cul-de-sac. It was necessary to proceed with endovaginal exam following the transabdominal exam to visualize the uterus and ovaries in greater detail. Color and duplex Doppler ultrasound was utilized to evaluate blood flow to the ovaries. COMPARISON:  CT of the abdomen and pelvis from 02/01/2015 FINDINGS: Uterus Measurements: 4.6 x 2.0 x 2.6 cm. No fibroids or other mass visualized. Endometrium Thickness: 0.2 cm.  No focal abnormality visualized. Right ovary Measurements: 2.9 x 2.2 x 2.7 cm. Normal appearance/no adnexal mass. Left ovary Not visualized. Pulsed Doppler evaluation of both ovaries demonstrates normal low-resistance arterial and venous waveforms. Other findings A small amount of free fluid is seen within the pelvic cul-de-sac. IMPRESSION: Unremarkable pelvic ultrasound. No evidence for ovarian torsion. The left ovary is not visualized on this study. Electronically Signed: By: Roanna Raider M.D. On: 02/09/2015 23:08   Dg Chest Portable 1 View  02/09/2015  CLINICAL DATA:  29 year old female with severe lower abdominal pain and shortness of breath. EXAM: PORTABLE CHEST 1 VIEW COMPARISON:  Chest x-ray 01/31/2015. FINDINGS: Small bilateral pleural effusions (left greater than right). Bibasilar opacities favored to predominantly reflect subsegmental atelectasis (underlying airspace consolidation in the left lower lobe is not excluded). Mid to upper lungs are clear. Pulmonary  venous congestion, without frank pulmonary edema. Heart size appears borderline enlarged. Upper mediastinal contours are slightly distorted by patient positioning. IMPRESSION: 1. Small bilateral pleural effusions (left greater than right) with probable bibasilar subsegmental atelectasis. 2. Borderline cardiomegaly with mild pulmonary venous congestion, but no frank pulmonary edema. Electronically Signed   By: Trudie Reed M.D.   On: 02/09/2015 21:10     ASSESSMENT / PLAN:  PULMONARY OETT A: At Risk Respiratory Failure - in the setting of intraabdominal hematoma / compromised respiratory mechanics Small Left Pleural Effusion  P:   Pulmonary hygiene: IS Oxygen if needed to support sats > 92%  CARDIOVASCULAR CVL 10/24 >>  A:  Hypovolemic Shock - responding to volume resuscitation, in setting of intraabdominal hematoma  Tachycardia - secondary to above  Poor Venous Access - unable to stick for labs  LE Edema - in setting of poor intake / low albumin, recent critical illness  Hx HTN P:  NS bolus x1 L now NS at 100 ml/hr Place central line for possible pressor need and lab draw access  Trend  CVP Q4  Neosynephrine as needed for MAP > 65  RENAL A:   At Risk AKI - in setting of volume depletion / hematoma, contrast for ABD CT P:   Saline as above  Monitor BMP / UOP  Replace electrolytes as indicated   GASTROINTESTINAL A:   Intraabdominal Hematoma - on lovenox / coumadin Resolving Alcoholic Pancreatitis  Splenic Vein Thrombosis - d/c'd on lovenox / coumadin Abdominal Pain  Hypoalbuminemia - in setting of poor intake / recent illness  P:   Difficult to control abdominal pain due to hypotension  CCS following, appreciate input. No indication for surgical intervention at this time Monitor lipase (43 on admit)  HEMATOLOGIC A:   Intraabdominal Hematoma Coagulopathy secondary to lovenox / coumadin  Leukocytosis  P:  INR Reversal  Hold further anticoagulation  Will  discuss case with IR in the event she needs embolization  Trend PT/INR Monitor CBC  S/P FFP x4 H/H Q8   INFECTIOUS A:   Resolving Alcoholic Pancreatitis  Pancreatic Pseudocysts - no indication of necrosis on CT P:   10/23 Wet Prep >> neg   Imipenem (resolving pancreatitis, new areas of complex fluid on CT) 10/24 >>   If decompensates further, consider addition of anti-fungal coverage Trend fever curve / WBC PCT protocol   ENDOCRINE A:   Hyperglycemia - mild P:   Trend glucose on BMP  If consistently > 180 add SSI   NEUROLOGIC A:   Pain  Anxiety  P:   RASS goal: n/a PRN fentanyl for pain    FAMILY  - Updates: Patient updated at bedside.  No family available at present.   - Inter-disciplinary family meet or Palliative Care meeting due by:  10/31     Canary Brim, NP-C [facility] Pgr: [phone] or if no answer [phone] 02/10/2015, 9:23 AM

## 2015-02-10 NOTE — Care Management Note (Signed)
Case Management Note  Patient Details  Name: Kara [name] MRN: [id number] Date of Birth: 03/22/1986  Subjective/Objective:               Intrapelvic bleed and hematoma     Action/Plan:Date: February 10, 2015 Chart reviewed for concurrent status and case management needs. Will continue to follow patient for changes and needs: Marcelle Smilinghonda Davis, RN, BSN, [place]   [phone]   Expected Discharge Date:                  Expected Discharge Plan:  Home/Self Care  In-House Referral:  Clinical Social Work  Discharge planning Services  CM Consult  Post Acute Care Choice:  NA Choice offered to:  NA  DME Arranged:  N/A DME Agency:  NA  HH Arranged:  NA HH Agency:  NA  Status of Service:  In process, will continue to follow  Medicare Important Message Given:    Date Medicare IM Given:    Medicare IM give by:    Date Additional Medicare IM Given:    Additional Medicare Important Message give by:     If discussed at Long Length of Stay Meetings, dates discussed:    Additional Comments:  Golda AcreDavis, Rhonda Lynn, RN 02/10/2015, 10:18 AM

## 2015-02-11 LAB — HEMOGLOBIN AND HEMATOCRIT, BLOOD
HCT: 27.1 % — ABNORMAL LOW (ref 36.0–46.0)
HEMATOCRIT: 25 % — AB (ref 36.0–46.0)
HEMOGLOBIN: 8.2 g/dL — AB (ref 12.0–15.0)
Hemoglobin: 9.2 g/dL — ABNORMAL LOW (ref 12.0–15.0)

## 2015-02-11 LAB — TYPE AND SCREEN
ABO/RH(D): O NEG
Antibody Screen: NEGATIVE
UNIT DIVISION: 0
UNIT DIVISION: 0
Unit division: 0

## 2015-02-11 LAB — PROCALCITONIN: PROCALCITONIN: 64.72 ng/mL

## 2015-02-11 LAB — CBC
HCT: 26.2 % — ABNORMAL LOW (ref 36.0–46.0)
Hemoglobin: 8.7 g/dL — ABNORMAL LOW (ref 12.0–15.0)
MCH: 31.2 pg (ref 26.0–34.0)
MCHC: 33.2 g/dL (ref 30.0–36.0)
MCV: 93.9 fL (ref 78.0–100.0)
PLATELETS: 303 10*3/uL (ref 150–400)
RBC: 2.79 MIL/uL — AB (ref 3.87–5.11)
RDW: 16.4 % — AB (ref 11.5–15.5)
WBC: 21.2 10*3/uL — ABNORMAL HIGH (ref 4.0–10.5)

## 2015-02-11 LAB — COMPREHENSIVE METABOLIC PANEL
ALT: 22 U/L (ref 14–54)
ANION GAP: 9 (ref 5–15)
AST: 64 U/L — ABNORMAL HIGH (ref 15–41)
Albumin: 2.7 g/dL — ABNORMAL LOW (ref 3.5–5.0)
Alkaline Phosphatase: 128 U/L — ABNORMAL HIGH (ref 38–126)
BUN: 8 mg/dL (ref 6–20)
CHLORIDE: 105 mmol/L (ref 101–111)
CO2: 24 mmol/L (ref 22–32)
Calcium: 8.2 mg/dL — ABNORMAL LOW (ref 8.9–10.3)
Creatinine, Ser: 0.65 mg/dL (ref 0.44–1.00)
GFR calc non Af Amer: >60 mL/min (ref >60)
Glucose, Bld: 123 mg/dL — ABNORMAL HIGH (ref 65–99)
POTASSIUM: 3.7 mmol/L (ref 3.5–5.1)
SODIUM: 138 mmol/L (ref 135–145)
Total Bilirubin: 2 mg/dL — ABNORMAL HIGH (ref 0.3–1.2)
Total Protein: 6.2 g/dL — ABNORMAL LOW (ref 6.5–8.1)

## 2015-02-11 LAB — PROTIME-INR
INR: 1.22 (ref 0.00–1.49)
INR: 1.2 (ref 0.00–1.49)
PROTHROMBIN TIME: 15.6 s — AB (ref 11.6–15.2)
PROTHROMBIN TIME: 15.3 s — AB (ref 11.6–15.2)

## 2015-02-11 LAB — PREPARE FRESH FROZEN PLASMA
UNIT DIVISION: 0
UNIT DIVISION: 0
UNIT DIVISION: 0
Unit division: 0
Unit division: 0
Unit division: 0

## 2015-02-11 LAB — LIPASE, BLOOD: Lipase: 25 U/L (ref 11–51)

## 2015-02-11 MED ORDER — CETYLPYRIDINIUM CHLORIDE 0.05 % MT LIQD
7.0000 mL | Freq: Two times a day (BID) | OROMUCOSAL | Status: DC
  Administered 2015-02-12 – 2015-02-21 (×14): 7 mL via OROMUCOSAL

## 2015-02-11 MED ORDER — CHLORHEXIDINE GLUCONATE 0.12 % MT SOLN
OROMUCOSAL | Status: AC
  Administered 2015-02-11: 15 mL
  Filled 2015-02-11: qty 15

## 2015-02-11 NOTE — Progress Notes (Signed)
Subjective: Still having allot of abdominal pain, moving is painful.  She is awake and alert, feeling better than yesterday.  Hemodynamically stable.  Objective: Vital signs in last 24 hours: Temp:  [98 F (36.7 C)-102 F (38.9 C)] 100.6 F (38.1 C) (10/25 0400) Pulse Rate:  [123-141] 123 (10/25 0600) Resp:  [20-45] 25 (10/25 0600) BP: (66-142)/(38-96) 132/84 mmHg (10/25 0600) SpO2:  [89 %-99 %] 94 % (10/25 0600) Weight:  [100.4 kg (221 lb 5.5 oz)] 100.4 kg (221 lb 5.5 oz) (10/24 0940) Last BM Date: 02/10/15 PO 100 [id number] Urine 690 Stool x 2 Fluid balance 6.1 liter positive TM 102 at 2300 hours last PM HR 102-130's BP up to 120-130 systolic range H/h up to 8.7/26.2 INR 1.20  Intake/Output from previous day: 10/24 0701 - 10/25 0700 In: 6804.2 [P.O.:100; I.V.:3064.2; Blood:1970; IV Piggyback:1500] Out: 690 [Urine:690] Intake/Output this shift:    General appearance: alert, cooperative, no distress and still having pain and it hurts just to move in the bed. GI: soft, + BS, not tender to palpation, pain with movement/  Lab Results:   Recent Labs  02/10/15 0157  02/10/15 2057 02/11/15 0509  WBC 17.0*  --   --  21.2*  HGB 11.2*  < > 9.2* 8.7*  HCT 34.1*  < > 27.1* 26.2*  PLT 403*  --   --  303  < > = values in this interval not displayed.  BMET  Recent Labs  02/10/15 0157 02/11/15 0509  NA 135 138  K 4.2 3.7  CL 102 105  CO2 22 24  GLUCOSE 142* 123*  BUN <5* 8  CREATININE 0.56 0.65  CALCIUM 8.2* 8.2*   PT/INR  Recent Labs  02/11/15 0126 02/11/15 0509  LABPROT 15.6* 15.3*  INR 1.22 1.20     Recent Labs Lab 02/07/15 0540 02/08/15 0550 02/09/15 0512 02/09/15 2040 02/11/15 0509  AST 68* 74* 53* 50* 64*  ALT 33 31 26 25 22   ALKPHOS 215* 217* 198* 200* 128*  BILITOT 1.5* 1.3* 1.1 1.6* 2.0*  PROT 6.0* 5.8* 6.0* 6.3* 6.2*  ALBUMIN 2.3* 2.3* 2.4* 2.5* 2.7*     Lipase     Component Value Date/Time   LIPASE 25 02/11/2015 0509      Studies/Results: Dg Chest 1 View  02/10/2015  CLINICAL DATA:  Central line placement EXAM: CHEST 1 VIEW COMPARISON:  02/09/2015 FINDINGS: Left IJ venous catheter terminates in the mid SVC. Patchy right lower lobe opacity, likely atelectasis. Cardiomegaly with pulmonary vascular congestion and possible mild interstitial edema edema. Moderate layering left pleural effusion, mildly increased. Small right pleural effusion. IMPRESSION: Left IJ venous catheter terminates in the mid SVC. Cardiomegaly pulmonary vascular congestion and possible mild interstitial edema. Moderate layering left pleural effusion, mildly increased. Small right pleural effusion. Patchy right lower lobe opacity, likely atelectasis. Electronically Signed   By: Charline Bills M.D.   On: 02/10/2015 11:54   US Transvaginal Non-ob  02/09/2015  ADDENDUM REPORT: 02/09/2015 23:43 ADDENDUM: The Doppler findings should read: Pulsed Doppler evaluation of the right ovary demonstrates normal low-resistance arterial and venous waveforms. The left ovary is not visualized on this study. Subsequent CT demonstrates a grossly unremarkable appearance to both ovaries. Electronically Signed   By: Roanna Raider M.D.   On: 02/09/2015 23:43  02/09/2015  CLINICAL DATA:  Acute onset of pelvic pain.  Initial encounter. EXAM: TRANSABDOMINAL AND TRANSVAGINAL ULTRASOUND OF PELVIS DOPPLER ULTRASOUND OF OVARIES TECHNIQUE: Both transabdominal and transvaginal ultrasound examinations of the pelvis were  performed. Transabdominal technique was performed for global imaging of the pelvis including uterus, ovaries, adnexal regions, and pelvic cul-de-sac. It was necessary to proceed with endovaginal exam following the transabdominal exam to visualize the uterus and ovaries in greater detail. Color and duplex Doppler ultrasound was utilized to evaluate blood flow to the ovaries. COMPARISON:  CT of the abdomen and pelvis from 02/01/2015 FINDINGS: Uterus Measurements: 4.6 x  2.0 x 2.6 cm. No fibroids or other mass visualized. Endometrium Thickness: 0.2 cm.  No focal abnormality visualized. Right ovary Measurements: 2.9 x 2.2 x 2.7 cm. Normal appearance/no adnexal mass. Left ovary Not visualized. Pulsed Doppler evaluation of both ovaries demonstrates normal low-resistance arterial and venous waveforms. Other findings A small amount of free fluid is seen within the pelvic cul-de-sac. IMPRESSION: Unremarkable pelvic ultrasound. No evidence for ovarian torsion. The left ovary is not visualized on this study. Electronically Signed: By: Roanna Raider M.D. On: 02/09/2015 23:08   US Pelvis Complete  02/09/2015  ADDENDUM REPORT: 02/09/2015 23:43 ADDENDUM: The Doppler findings should read: Pulsed Doppler evaluation of the right ovary demonstrates normal low-resistance arterial and venous waveforms. The left ovary is not visualized on this study. Subsequent CT demonstrates a grossly unremarkable appearance to both ovaries. Electronically Signed   By: Roanna Raider M.D.   On: 02/09/2015 23:43  02/09/2015  CLINICAL DATA:  Acute onset of pelvic pain.  Initial encounter. EXAM: TRANSABDOMINAL AND TRANSVAGINAL ULTRASOUND OF PELVIS DOPPLER ULTRASOUND OF OVARIES TECHNIQUE: Both transabdominal and transvaginal ultrasound examinations of the pelvis were performed. Transabdominal technique was performed for global imaging of the pelvis including uterus, ovaries, adnexal regions, and pelvic cul-de-sac. It was necessary to proceed with endovaginal exam following the transabdominal exam to visualize the uterus and ovaries in greater detail. Color and duplex Doppler ultrasound was utilized to evaluate blood flow to the ovaries. COMPARISON:  CT of the abdomen and pelvis from 02/01/2015 FINDINGS: Uterus Measurements: 4.6 x 2.0 x 2.6 cm. No fibroids or other mass visualized. Endometrium Thickness: 0.2 cm.  No focal abnormality visualized. Right ovary Measurements: 2.9 x 2.2 x 2.7 cm. Normal appearance/no  adnexal mass. Left ovary Not visualized. Pulsed Doppler evaluation of both ovaries demonstrates normal low-resistance arterial and venous waveforms. Other findings A small amount of free fluid is seen within the pelvic cul-de-sac. IMPRESSION: Unremarkable pelvic ultrasound. No evidence for ovarian torsion. The left ovary is not visualized on this study. Electronically Signed: By: Roanna Raider M.D. On: 02/09/2015 23:08   Ct Abdomen Pelvis W Contrast  02/09/2015  CLINICAL DATA:  Acute onset of pelvic and lower abdominal pain. Lower back pain. Initial encounter. EXAM: CT ABDOMEN AND PELVIS WITH CONTRAST TECHNIQUE: Multidetector CT imaging of the abdomen and pelvis was performed using the standard protocol following bolus administration of intravenous contrast. CONTRAST:  OMNIPAQUE IOHEXOL 300 MG/ML  SOLN COMPARISON:  CT of the abdomen and pelvis performed 02/01/2015, and pelvic ultrasound performed earlier today at 10:21 p.m. FINDINGS: A small left pleural effusion is noted, with bibasilar atelectasis. Since the prior study, there has been interval development of a large heterogeneous 13.7 x 10.0 x 7.4 cm collection of fluid at the left mid abdomen and left lower quadrant, tracking inferiorly from the pancreatic tail, with multiple small foci of high attenuation. It is noted anterior to the left kidney and left psoas musculature. This is highly suspicious for a new intra-abdominal hematoma, with multiple small foci of contrast extravasation, reflecting active bleeding. This is a very unusual appearance for hemorrhagic pancreatitis.  New retroperitoneal dense soft tissue inflammation is also noted about the level of the aortic bifurcation, measuring 7.5 x 5.2 x 2.9 cm, concerning for extension of soft tissue inflammation into the inferior retroperitoneum. New evolving pseudocysts are noted about the pancreatic tail, measuring up to 5.2 cm in size. One of these fluid collections extends adjacent to small bowel  loops. Previously noted soft tissue inflammation about the head and body of the pancreas has improved. There is no evidence of devascularization. There is diffuse fatty infiltration within the liver. The spleen is unremarkable in appearance, though inflammatory fluid is seen tracking about the spleen. The gallbladder is grossly unremarkable. The adrenal glands are grossly unremarkable, though the left adrenal gland is difficult to fully assess due to surrounding fluid. The kidneys are unremarkable in appearance. There is no evidence of hydronephrosis. No renal or ureteral stones are seen. No perinephric stranding is appreciated. Minimal left-sided perinephric fluid likely reflects the overlying hematoma. The small bowel is unremarkable in appearance. A vague evolving pseudocyst is noted at the fundus of the stomach, similar in appearance to the prior study, measuring approximately 4.0 cm. The stomach is otherwise grossly unremarkable in appearance. The appendix is normal in caliber, without evidence of appendicitis. The colon is decompressed and not well assessed, but appears grossly unremarkable. The bladder is decompressed. Somewhat complex fluid is noted tracking within the pelvis, likely extending from the patient's pancreatitis. The uterus is unremarkable in appearance. The ovaries are grossly symmetric. No suspicious adnexal masses are seen. No inguinal lymphadenopathy is seen. No acute osseous abnormalities are identified. IMPRESSION: 1. Interval development of a large heterogeneous 13.7 x 10.0 x 7.4 cm collection of fluid at the left mid abdomen and left lower quadrant, tracking inferiorly from the pancreatic tail anterior to the left kidney and left psoas musculature. Multiple small foci of high attenuation noted within the collection. This is highly suspicious for a new intra-abdominal hematoma, with multiple small foci of active bleeding. This may reflect pancreatic enzymes eroding into tiny surrounding  vessels, given that the patient is on anticoagulation. This is a very unusual appearance for hemorrhagic pancreatitis. 2. Additional new retroperitoneal dense soft tissue inflammation noted about the level of the aortic bifurcation, measuring 7.5 x 5.2 x 2.9 cm, concerning for extension of soft tissue inflammation into the inferior retroperitoneum. 3. New evolving pseudocysts about the pancreatic tail, measuring up to 5.2 cm in size. One of these fluid collections extends adjacent to small bowel loops, though the small bowel is unremarkable in appearance. Soft tissue inflammation about the head and body of the pancreas has improved. No evidence of devascularization. 4. 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 Complex fluid tracks about the spleen. 5. Somewhat complex fluid noted tracking within the pelvis, likely extending from the patient's pancreatitis. 6. Small left pleural effusion noted, with bibasilar atelectasis. 7. Diffuse fatty infiltration within the liver. Critical Value/emergent results were called by telephone at the time of interpretation on 02/09/2015 at 11:35 pm to Dr. Glynn Octave, who verbally acknowledged these results. Electronically Signed   By: Roanna Raider M.D.   On: 02/09/2015 23:57   Ir Angiogram Visceral Selective  02/10/2015  CLINICAL DATA:  Acute pancreatitis with retroperitoneal fluid collections, splenic vein thrombosis, on anticoagulation. Recent CT demonstrates new left retroperitoneal hematoma with multifocal regions of active extravasation. Significant blood loss requiring transfusion. EXAM: SELECTIVE VISCERAL ARTERIOGRAPHY; IR EMBO ART VEN HEMORR LYMPH EXTRAV INC  GUIDE ROADMAPPING; ADDITIONAL ARTERIOGRAPHY; IR ULTRASOUND GUIDANCE VASC ACCESS RIGHT MEDICATIONS: Intravenous Fentanyl and Versed were administered as conscious sedation during continuous cardiorespiratory monitoring by the radiology RN,  with a total moderate sedation time of 85 minutes. CONTRAST:  OMNIPAQUE IOHEXOL 300 MG/ML  SOLN FLUOROSCOPY TIME:  3.5 minutes, 48 51  uGym2 DAP PROCEDURE: The procedure, risks (including but not limited to bleeding, infection, organ damage ), benefits, and alternatives were explained to the patient. Questions regarding the procedure were encouraged and answered. The patient understands and consents to the procedure. Site was marked, prepped with Betadine, draped in usual sterile fashion, infiltrated locally with 1% lidocaine. Under ultrasound, right common femoral artery localized. Under real-time ultrasound guidance, the vessel was accessed with a 21-gauge micropuncture needle, exchanged over a 018 guidewire for a transitional dilator, through which a 035 guidewire was advanced. A 5 French vascular sheath was placed. A 5 French C2 catheter advanced and used to selectively catheterize the splenic artery for selective arteriography. Catheter was then pulled back into the celiac axis for additional selective arteriography. Catheter then used to selectively catheterize the superior mesenteric artery for selective arteriography in multiple projections. The catheter was exchanged over the guidewire for a RIM catheter for additional SMA arteriography. The RIM was exchanged back for the C2, used to selectively catheterize the inferior mesenteric artery for selective arteriography in multiple projections. A renegade micro catheter was then coaxially advanced with the aid of a Fathom guidewire and used to selectively catheterize 3 different third order branches of the IMA for additional supra selective arteriography. Active extravasation at the level of the proximal descending colon was treated with 2 mm coil embolization. Follow-up arteriography was performed. The micro catheter was then redirected and coil embolization of a second site of active extravasation at the level of the distal descending colon was  performed. Follow-up arteriography was performed. The micro catheter and C2 were then withdrawn. After confirmatory femoral arteriogram, the sheath was removed and hemostasis achieved with the aid of Exoseal device. The patient tolerated the procedure well. COMPLICATIONS: None immediate FINDINGS: Selective splenic and celiac arteriography demonstrated no active extravasation, pseudoaneurysm, or other unexpected arterial finding. 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 Selective superior mesenteric arteriography demonstrated no active extravasation, pseudoaneurysm, or other vascular abnormality. The proper hepatic artery originates from the gastroduodenal artery, an anatomic variant. Venous phase confirms patency of superior mesenteric vein and portal vein with antegrade flow. Inferior mesenteric arteriography demonstrates several extraluminal foci of active extravasation in the left retroperitoneum, corresponding to findings on recent CT. Subselective micro catheter arteriography localized the segmental supply to an area of active extravasation at the level of the proximal descending colon. This area was coil embolized, and follow-up arteriography demonstrated no further active extravasation. Sub selective micro catheter arteriography at the level of the sigmoid colon failed to localize the branches supplying the smaller site of active extravasation. Subselective micro catheter arteriography at the level of the distal descending colon localized a site of active extravasation, and once the supplying vessels were identified, septal sub selective coil Embolization was performed. Follow-up arteriography demonstrated no further extravasation in this region. IMPRESSION: 1. At least 3 separate foci of active extravasation in the left lower retroperitoneum from small branches of the inferior mesenteric artery circulation. 2. Technically successful coil  embolization of 2 sites of active extravasation. Sub selective supply to the third site at the level of the sigmoid colon could not be localized. Critical Value/emergent results  were discussed with Dr. Carolynne Edouard from surgery, who verbally acknowledged these results. 3. Splenic vein occlusion, as previously documented. Electronically Signed   By: Corlis Leak M.D.   On: 02/10/2015 16:23   Ir Angiogram Visceral Selective  02/10/2015  CLINICAL DATA:  Acute pancreatitis with retroperitoneal fluid collections, splenic vein thrombosis, on anticoagulation. Recent CT demonstrates new left retroperitoneal hematoma with multifocal regions of active extravasation. Significant blood loss requiring transfusion. EXAM: SELECTIVE VISCERAL ARTERIOGRAPHY; IR EMBO ART VEN HEMORR LYMPH EXTRAV INC GUIDE ROADMAPPING; ADDITIONAL ARTERIOGRAPHY; IR ULTRASOUND GUIDANCE VASC ACCESS RIGHT MEDICATIONS: Intravenous Fentanyl and Versed were administered as conscious sedation during continuous cardiorespiratory monitoring by the radiology RN, with a total moderate sedation time of 85 minutes. CONTRAST:  OMNIPAQUE IOHEXOL 300 MG/ML  SOLN FLUOROSCOPY TIME:  3.5 minutes, 48 51  uGym2 DAP PROCEDURE: The procedure, risks (including but not limited to bleeding, infection, organ damage ), benefits, and alternatives were explained to the patient. Questions regarding the procedure were encouraged and answered. The patient understands and consents to the procedure. Site was marked, prepped with Betadine, draped in usual sterile fashion, infiltrated locally with 1% lidocaine. Under ultrasound, right common femoral artery localized. Under real-time ultrasound guidance, the vessel was accessed with a 21-gauge micropuncture needle, exchanged over a 018 guidewire for a transitional dilator, through which a 035 guidewire was advanced. A 5 French vascular sheath was placed. A 5 French C2 catheter advanced and used to selectively catheterize the splenic artery  for selective arteriography. Catheter was then pulled back into the celiac axis for additional selective arteriography. Catheter then used to selectively catheterize the superior mesenteric artery for selective arteriography in multiple projections. The catheter was exchanged over the guidewire for a RIM catheter for additional SMA arteriography. The RIM was exchanged back for the C2, used to selectively catheterize the inferior mesenteric artery for selective arteriography in multiple projections. A renegade micro catheter was then coaxially advanced with the aid of a Fathom guidewire and used to selectively catheterize 3 different third order branches of the IMA for additional supra selective arteriography. Active extravasation at the level of the proximal descending colon was treated with 2 mm coil embolization. Follow-up arteriography was performed. The micro catheter was then redirected and coil embolization of a second site of active extravasation at the level of the distal descending colon was performed. Follow-up arteriography was performed. The micro catheter and C2 were then withdrawn. After confirmatory femoral arteriogram, the sheath was removed and hemostasis achieved with the aid of Exoseal device. The patient tolerated the procedure well. COMPLICATIONS: None immediate FINDINGS: Selective splenic and celiac arteriography demonstrated no active extravasation, pseudoaneurysm, or other unexpected arterial finding. 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 Selective superior mesenteric arteriography demonstrated no active extravasation, pseudoaneurysm, or other vascular abnormality. The proper hepatic artery originates from the gastroduodenal artery, an anatomic variant. Venous phase confirms patency of superior mesenteric vein and portal vein with antegrade flow. Inferior mesenteric arteriography demonstrates several extraluminal foci of  active extravasation in the left retroperitoneum, corresponding to findings on recent CT. Subselective micro catheter arteriography localized the segmental supply to an area of active extravasation at the level of the proximal descending colon. This area was coil embolized, and follow-up arteriography demonstrated no further active extravasation. Sub selective micro catheter arteriography at the level of the sigmoid colon failed to localize the branches supplying the smaller site of active extravasation. Subselective micro catheter arteriography at the level of the distal descending colon  localized a site of active extravasation, and once the supplying vessels were identified, septal sub selective coil Embolization was performed. Follow-up arteriography demonstrated no further extravasation in this region. IMPRESSION: 1. At least 3 separate foci of active extravasation in the left lower retroperitoneum from small branches of the inferior mesenteric artery circulation. 2. Technically successful coil embolization of 2 sites of active extravasation. Sub selective supply to the third site at the level of the sigmoid colon could not be localized. Critical Value/emergent results were discussed with Dr. Carolynne Edouard from surgery, who verbally acknowledged these results. 3. Splenic vein occlusion, as previously documented. Electronically Signed   By: Corlis Leak M.D.   On: 02/10/2015 16:23   Ir Angiogram Visceral Selective  02/10/2015  CLINICAL DATA:  Acute pancreatitis with retroperitoneal fluid collections, splenic vein thrombosis, on anticoagulation. Recent CT demonstrates new left retroperitoneal hematoma with multifocal regions of active extravasation. Significant blood loss requiring transfusion. EXAM: SELECTIVE VISCERAL ARTERIOGRAPHY; IR EMBO ART VEN HEMORR LYMPH EXTRAV INC GUIDE ROADMAPPING; ADDITIONAL ARTERIOGRAPHY; IR ULTRASOUND GUIDANCE VASC ACCESS RIGHT MEDICATIONS: Intravenous Fentanyl and Versed were administered  as conscious sedation during continuous cardiorespiratory monitoring by the radiology RN, with a total moderate sedation time of 85 minutes. CONTRAST:  OMNIPAQUE IOHEXOL 300 MG/ML  SOLN FLUOROSCOPY TIME:  3.5 minutes, 48 51  uGym2 DAP PROCEDURE: The procedure, risks (including but not limited to bleeding, infection, organ damage ), benefits, and alternatives were explained to the patient. Questions regarding the procedure were encouraged and answered. The patient understands and consents to the procedure. Site was marked, prepped with Betadine, draped in usual sterile fashion, infiltrated locally with 1% lidocaine. Under ultrasound, right common femoral artery localized. Under real-time ultrasound guidance, the vessel was accessed with a 21-gauge micropuncture needle, exchanged over a 018 guidewire for a transitional dilator, through which a 035 guidewire was advanced. A 5 French vascular sheath was placed. A 5 French C2 catheter advanced and used to selectively catheterize the splenic artery for selective arteriography. Catheter was then pulled back into the celiac axis for additional selective arteriography. Catheter then used to selectively catheterize the superior mesenteric artery for selective arteriography in multiple projections. The catheter was exchanged over the guidewire for a RIM catheter for additional SMA arteriography. The RIM was exchanged back for the C2, used to selectively catheterize the inferior mesenteric artery for selective arteriography in multiple projections. A renegade micro catheter was then coaxially advanced with the aid of a Fathom guidewire and used to selectively catheterize 3 different third order branches of the IMA for additional supra selective arteriography. Active extravasation at the level of the proximal descending colon was treated with 2 mm coil embolization. Follow-up arteriography was performed. The micro catheter was then redirected and coil embolization of a  second site of active extravasation at the level of the distal descending colon was performed. Follow-up arteriography was performed. The micro catheter and C2 were then withdrawn. After confirmatory femoral arteriogram, the sheath was removed and hemostasis achieved with the aid of Exoseal device. The patient tolerated the procedure well. COMPLICATIONS: None immediate FINDINGS: Selective splenic and celiac arteriography demonstrated no active extravasation, pseudoaneurysm, or other unexpected arterial finding. 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 Selective superior mesenteric arteriography demonstrated no active extravasation, pseudoaneurysm, or other vascular abnormality. The proper hepatic artery originates from the gastroduodenal artery, an anatomic variant. Venous phase confirms patency of superior mesenteric vein and portal vein with antegrade flow. Inferior mesenteric arteriography demonstrates several extraluminal  foci of active extravasation in the left retroperitoneum, corresponding to findings on recent CT. Subselective micro catheter arteriography localized the segmental supply to an area of active extravasation at the level of the proximal descending colon. This area was coil embolized, and follow-up arteriography demonstrated no further active extravasation. Sub selective micro catheter arteriography at the level of the sigmoid colon failed to localize the branches supplying the smaller site of active extravasation. Subselective micro catheter arteriography at the level of the distal descending colon localized a site of active extravasation, and once the supplying vessels were identified, septal sub selective coil Embolization was performed. Follow-up arteriography demonstrated no further extravasation in this region. IMPRESSION: 1. At least 3 separate foci of active extravasation in the left lower retroperitoneum from small branches  of the inferior mesenteric artery circulation. 2. Technically successful coil embolization of 2 sites of active extravasation. Sub selective supply to the third site at the level of the sigmoid colon could not be localized. Critical Value/emergent results were discussed with Dr. Carolynne Edouard from surgery, who verbally acknowledged these results. 3. Splenic vein occlusion, as previously documented. Electronically Signed   By: Corlis Leak M.D.   On: 02/10/2015 16:23   Ir Angiogram Selective Each Additional Vessel  02/10/2015  CLINICAL DATA:  Acute pancreatitis with retroperitoneal fluid collections, splenic vein thrombosis, on anticoagulation. Recent CT demonstrates new left retroperitoneal hematoma with multifocal regions of active extravasation. Significant blood loss requiring transfusion. EXAM: SELECTIVE VISCERAL ARTERIOGRAPHY; IR EMBO ART VEN HEMORR LYMPH EXTRAV INC GUIDE ROADMAPPING; ADDITIONAL ARTERIOGRAPHY; IR ULTRASOUND GUIDANCE VASC ACCESS RIGHT MEDICATIONS: Intravenous Fentanyl and Versed were administered as conscious sedation during continuous cardiorespiratory monitoring by the radiology RN, with a total moderate sedation time of 85 minutes. CONTRAST:  OMNIPAQUE IOHEXOL 300 MG/ML  SOLN FLUOROSCOPY TIME:  3.5 minutes, 48 51  uGym2 DAP PROCEDURE: The procedure, risks (including but not limited to bleeding, infection, organ damage ), benefits, and alternatives were explained to the patient. Questions regarding the procedure were encouraged and answered. The patient understands and consents to the procedure. Site was marked, prepped with Betadine, draped in usual sterile fashion, infiltrated locally with 1% lidocaine. Under ultrasound, right common femoral artery localized. Under real-time ultrasound guidance, the vessel was accessed with a 21-gauge micropuncture needle, exchanged over a 018 guidewire for a transitional dilator, through which a 035 guidewire was advanced. A 5 French vascular sheath was  placed. A 5 French C2 catheter advanced and used to selectively catheterize the splenic artery for selective arteriography. Catheter was then pulled back into the celiac axis for additional selective arteriography. Catheter then used to selectively catheterize the superior mesenteric artery for selective arteriography in multiple projections. The catheter was exchanged over the guidewire for a RIM catheter for additional SMA arteriography. The RIM was exchanged back for the C2, used to selectively catheterize the inferior mesenteric artery for selective arteriography in multiple projections. A renegade micro catheter was then coaxially advanced with the aid of a Fathom guidewire and used to selectively catheterize 3 different third order branches of the IMA for additional supra selective arteriography. Active extravasation at the level of the proximal descending colon was treated with 2 mm coil embolization. Follow-up arteriography was performed. The micro catheter was then redirected and coil embolization of a second site of active extravasation at the level of the distal descending colon was performed. Follow-up arteriography was performed. The micro catheter and C2 were then withdrawn. After confirmatory femoral arteriogram, the sheath was removed and hemostasis achieved with  the aid of Exoseal device. The patient tolerated the procedure well. COMPLICATIONS: None immediate FINDINGS: Selective splenic and celiac arteriography demonstrated no active extravasation, pseudoaneurysm, or other unexpected arterial finding. 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 Selective superior mesenteric arteriography demonstrated no active extravasation, pseudoaneurysm, or other vascular abnormality. The proper hepatic artery originates from the gastroduodenal artery, an anatomic variant. Venous phase confirms patency of superior mesenteric vein and portal vein with  antegrade flow. Inferior mesenteric arteriography demonstrates several extraluminal foci of active extravasation in the left retroperitoneum, corresponding to findings on recent CT. Subselective micro catheter arteriography localized the segmental supply to an area of active extravasation at the level of the proximal descending colon. This area was coil embolized, and follow-up arteriography demonstrated no further active extravasation. Sub selective micro catheter arteriography at the level of the sigmoid colon failed to localize the branches supplying the smaller site of active extravasation. Subselective micro catheter arteriography at the level of the distal descending colon localized a site of active extravasation, and once the supplying vessels were identified, septal sub selective coil Embolization was performed. Follow-up arteriography demonstrated no further extravasation in this region. IMPRESSION: 1. At least 3 separate foci of active extravasation in the left lower retroperitoneum from small branches of the inferior mesenteric artery circulation. 2. Technically successful coil embolization of 2 sites of active extravasation. Sub selective supply to the third site at the level of the sigmoid colon could not be localized. Critical Value/emergent results were discussed with Dr. Carolynne Edouard from surgery, who verbally acknowledged these results. 3. Splenic vein occlusion, as previously documented. Electronically Signed   By: Corlis Leak M.D.   On: 02/10/2015 16:23   Ir Angiogram Selective Each Additional Vessel  02/10/2015  CLINICAL DATA:  Acute pancreatitis with retroperitoneal fluid collections, splenic vein thrombosis, on anticoagulation. Recent CT demonstrates new left retroperitoneal hematoma with multifocal regions of active extravasation. Significant blood loss requiring transfusion. EXAM: SELECTIVE VISCERAL ARTERIOGRAPHY; IR EMBO ART VEN HEMORR LYMPH EXTRAV INC GUIDE ROADMAPPING; ADDITIONAL ARTERIOGRAPHY;  IR ULTRASOUND GUIDANCE VASC ACCESS RIGHT MEDICATIONS: Intravenous Fentanyl and Versed were administered as conscious sedation during continuous cardiorespiratory monitoring by the radiology RN, with a total moderate sedation time of 85 minutes. CONTRAST:  OMNIPAQUE IOHEXOL 300 MG/ML  SOLN FLUOROSCOPY TIME:  3.5 minutes, 48 51  uGym2 DAP PROCEDURE: The procedure, risks (including but not limited to bleeding, infection, organ damage ), benefits, and alternatives were explained to the patient. Questions regarding the procedure were encouraged and answered. The patient understands and consents to the procedure. Site was marked, prepped with Betadine, draped in usual sterile fashion, infiltrated locally with 1% lidocaine. Under ultrasound, right common femoral artery localized. Under real-time ultrasound guidance, the vessel was accessed with a 21-gauge micropuncture needle, exchanged over a 018 guidewire for a transitional dilator, through which a 035 guidewire was advanced. A 5 French vascular sheath was placed. A 5 French C2 catheter advanced and used to selectively catheterize the splenic artery for selective arteriography. Catheter was then pulled back into the celiac axis for additional selective arteriography. Catheter then used to selectively catheterize the superior mesenteric artery for selective arteriography in multiple projections. The catheter was exchanged over the guidewire for a RIM catheter for additional SMA arteriography. The RIM was exchanged back for the C2, used to selectively catheterize the inferior mesenteric artery for selective arteriography in multiple projections. A renegade micro catheter was then coaxially advanced with the aid of a Fathom guidewire and used  to selectively catheterize 3 different third order branches of the IMA for additional supra selective arteriography. Active extravasation at the level of the proximal descending colon was treated with 2 mm coil embolization.  Follow-up arteriography was performed. The micro catheter was then redirected and coil embolization of a second site of active extravasation at the level of the distal descending colon was performed. Follow-up arteriography was performed. The micro catheter and C2 were then withdrawn. After confirmatory femoral arteriogram, the sheath was removed and hemostasis achieved with the aid of Exoseal device. The patient tolerated the procedure well. COMPLICATIONS: None immediate FINDINGS: Selective splenic and celiac arteriography demonstrated no active extravasation, pseudoaneurysm, or other unexpected arterial finding. 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 Selective superior mesenteric arteriography demonstrated no active extravasation, pseudoaneurysm, or other vascular abnormality. The proper hepatic artery originates from the gastroduodenal artery, an anatomic variant. Venous phase confirms patency of superior mesenteric vein and portal vein with antegrade flow. Inferior mesenteric arteriography demonstrates several extraluminal foci of active extravasation in the left retroperitoneum, corresponding to findings on recent CT. Subselective micro catheter arteriography localized the segmental supply to an area of active extravasation at the level of the proximal descending colon. This area was coil embolized, and follow-up arteriography demonstrated no further active extravasation. Sub selective micro catheter arteriography at the level of the sigmoid colon failed to localize the branches supplying the smaller site of active extravasation. Subselective micro catheter arteriography at the level of the distal descending colon localized a site of active extravasation, and once the supplying vessels were identified, septal sub selective coil Embolization was performed. Follow-up arteriography demonstrated no further extravasation in this region. IMPRESSION:  1. At least 3 separate foci of active extravasation in the left lower retroperitoneum from small branches of the inferior mesenteric artery circulation. 2. Technically successful coil embolization of 2 sites of active extravasation. Sub selective supply to the third site at the level of the sigmoid colon could not be localized. Critical Value/emergent results were discussed with Dr. Carolynne Edouard from surgery, who verbally acknowledged these results. 3. Splenic vein occlusion, as previously documented. Electronically Signed   By: Corlis Leak M.D.   On: 02/10/2015 16:23   Ir Angiogram Selective Each Additional Vessel  02/10/2015  CLINICAL DATA:  Acute pancreatitis with retroperitoneal fluid collections, splenic vein thrombosis, on anticoagulation. Recent CT demonstrates new left retroperitoneal hematoma with multifocal regions of active extravasation. Significant blood loss requiring transfusion. EXAM: SELECTIVE VISCERAL ARTERIOGRAPHY; IR EMBO ART VEN HEMORR LYMPH EXTRAV INC GUIDE ROADMAPPING; ADDITIONAL ARTERIOGRAPHY; IR ULTRASOUND GUIDANCE VASC ACCESS RIGHT MEDICATIONS: Intravenous Fentanyl and Versed were administered as conscious sedation during continuous cardiorespiratory monitoring by the radiology RN, with a total moderate sedation time of 85 minutes. CONTRAST:  OMNIPAQUE IOHEXOL 300 MG/ML  SOLN FLUOROSCOPY TIME:  3.5 minutes, 48 51  uGym2 DAP PROCEDURE: The procedure, risks (including but not limited to bleeding, infection, organ damage ), benefits, and alternatives were explained to the patient. Questions regarding the procedure were encouraged and answered. The patient understands and consents to the procedure. Site was marked, prepped with Betadine, draped in usual sterile fashion, infiltrated locally with 1% lidocaine. Under ultrasound, right common femoral artery localized. Under real-time ultrasound guidance, the vessel was accessed with a 21-gauge micropuncture needle, exchanged over a 018 guidewire  for a transitional dilator, through which a 035 guidewire was advanced. A 5 French vascular sheath was placed. A 5 French C2 catheter advanced and used to selectively catheterize the  splenic artery for selective arteriography. Catheter was then pulled back into the celiac axis for additional selective arteriography. Catheter then used to selectively catheterize the superior mesenteric artery for selective arteriography in multiple projections. The catheter was exchanged over the guidewire for a RIM catheter for additional SMA arteriography. The RIM was exchanged back for the C2, used to selectively catheterize the inferior mesenteric artery for selective arteriography in multiple projections. A renegade micro catheter was then coaxially advanced with the aid of a Fathom guidewire and used to selectively catheterize 3 different third order branches of the IMA for additional supra selective arteriography. Active extravasation at the level of the proximal descending colon was treated with 2 mm coil embolization. Follow-up arteriography was performed. The micro catheter was then redirected and coil embolization of a second site of active extravasation at the level of the distal descending colon was performed. Follow-up arteriography was performed. The micro catheter and C2 were then withdrawn. After confirmatory femoral arteriogram, the sheath was removed and hemostasis achieved with the aid of Exoseal device. The patient tolerated the procedure well. COMPLICATIONS: None immediate FINDINGS: Selective splenic and celiac arteriography demonstrated no active extravasation, pseudoaneurysm, or other unexpected arterial finding. 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 Selective superior mesenteric arteriography demonstrated no active extravasation, pseudoaneurysm, or other vascular abnormality. The proper hepatic artery originates from the gastroduodenal  artery, an anatomic variant. Venous phase confirms patency of superior mesenteric vein and portal vein with antegrade flow. Inferior mesenteric arteriography demonstrates several extraluminal foci of active extravasation in the left retroperitoneum, corresponding to findings on recent CT. Subselective micro catheter arteriography localized the segmental supply to an area of active extravasation at the level of the proximal descending colon. This area was coil embolized, and follow-up arteriography demonstrated no further active extravasation. Sub selective micro catheter arteriography at the level of the sigmoid colon failed to localize the branches supplying the smaller site of active extravasation. Subselective micro catheter arteriography at the level of the distal descending colon localized a site of active extravasation, and once the supplying vessels were identified, septal sub selective coil Embolization was performed. Follow-up arteriography demonstrated no further extravasation in this region. IMPRESSION: 1. At least 3 separate foci of active extravasation in the left lower retroperitoneum from small branches of the inferior mesenteric artery circulation. 2. Technically successful coil embolization of 2 sites of active extravasation. Sub selective supply to the third site at the level of the sigmoid colon could not be localized. Critical Value/emergent results were discussed with Dr. Carolynne Edouard from surgery, who verbally acknowledged these results. 3. Splenic vein occlusion, as previously documented. Electronically Signed   By: Corlis Leak M.D.   On: 02/10/2015 16:23   [place] Art/ven Flow Abd Pelv Doppler  02/09/2015  ADDENDUM REPORT: 02/09/2015 23:43 ADDENDUM: The Doppler findings should read: Pulsed Doppler evaluation of the right ovary demonstrates normal low-resistance arterial and venous waveforms. The left ovary is not visualized on this study. Subsequent CT demonstrates a grossly unremarkable appearance to  both ovaries. Electronically Signed   By: Roanna Raider M.D.   On: 02/09/2015 23:43  02/09/2015  CLINICAL DATA:  Acute onset of pelvic pain.  Initial encounter. EXAM: TRANSABDOMINAL AND TRANSVAGINAL ULTRASOUND OF PELVIS DOPPLER ULTRASOUND OF OVARIES TECHNIQUE: Both transabdominal and transvaginal ultrasound examinations of the pelvis were performed. Transabdominal technique was performed for global imaging of the pelvis including uterus, ovaries, adnexal regions, and pelvic cul-de-sac. It was necessary to proceed with endovaginal exam following the  transabdominal exam to visualize the uterus and ovaries in greater detail. Color and duplex Doppler ultrasound was utilized to evaluate blood flow to the ovaries. COMPARISON:  CT of the abdomen and pelvis from 02/01/2015 FINDINGS: Uterus Measurements: 4.6 x 2.0 x 2.6 cm. No fibroids or other mass visualized. Endometrium Thickness: 0.2 cm.  No focal abnormality visualized. Right ovary Measurements: 2.9 x 2.2 x 2.7 cm. Normal appearance/no adnexal mass. Left ovary Not visualized. Pulsed Doppler evaluation of both ovaries demonstrates normal low-resistance arterial and venous waveforms. Other findings A small amount of free fluid is seen within the pelvic cul-de-sac. IMPRESSION: Unremarkable pelvic ultrasound. No evidence for ovarian torsion. The left ovary is not visualized on this study. Electronically Signed: By: Roanna Raider M.D. On: 02/09/2015 23:08   Ir US Guide Vasc Access Right  02/10/2015  CLINICAL DATA:  Acute pancreatitis with retroperitoneal fluid collections, splenic vein thrombosis, on anticoagulation. Recent CT demonstrates new left retroperitoneal hematoma with multifocal regions of active extravasation. Significant blood loss requiring transfusion. EXAM: SELECTIVE VISCERAL ARTERIOGRAPHY; IR EMBO ART VEN HEMORR LYMPH EXTRAV INC GUIDE ROADMAPPING; ADDITIONAL ARTERIOGRAPHY; IR ULTRASOUND GUIDANCE VASC ACCESS RIGHT MEDICATIONS: Intravenous Fentanyl and  Versed were administered as conscious sedation during continuous cardiorespiratory monitoring by the radiology RN, with a total moderate sedation time of 85 minutes. CONTRAST:  OMNIPAQUE IOHEXOL 300 MG/ML  SOLN FLUOROSCOPY TIME:  3.5 minutes, 48 51  uGym2 DAP PROCEDURE: The procedure, risks (including but not limited to bleeding, infection, organ damage ), benefits, and alternatives were explained to the patient. Questions regarding the procedure were encouraged and answered. The patient understands and consents to the procedure. Site was marked, prepped with Betadine, draped in usual sterile fashion, infiltrated locally with 1% lidocaine. Under ultrasound, right common femoral artery localized. Under real-time ultrasound guidance, the vessel was accessed with a 21-gauge micropuncture needle, exchanged over a 018 guidewire for a transitional dilator, through which a 035 guidewire was advanced. A 5 French vascular sheath was placed. A 5 French C2 catheter advanced and used to selectively catheterize the splenic artery for selective arteriography. Catheter was then pulled back into the celiac axis for additional selective arteriography. Catheter then used to selectively catheterize the superior mesenteric artery for selective arteriography in multiple projections. The catheter was exchanged over the guidewire for a RIM catheter for additional SMA arteriography. The RIM was exchanged back for the C2, used to selectively catheterize the inferior mesenteric artery for selective arteriography in multiple projections. A renegade micro catheter was then coaxially advanced with the aid of a Fathom guidewire and used to selectively catheterize 3 different third order branches of the IMA for additional supra selective arteriography. Active extravasation at the level of the proximal descending colon was treated with 2 mm coil embolization. Follow-up arteriography was performed. The micro catheter was then redirected and  coil embolization of a second site of active extravasation at the level of the distal descending colon was performed. Follow-up arteriography was performed. The micro catheter and C2 were then withdrawn. After confirmatory femoral arteriogram, the sheath was removed and hemostasis achieved with the aid of Exoseal device. The patient tolerated the procedure well. COMPLICATIONS: None immediate FINDINGS: Selective splenic and celiac arteriography demonstrated no active extravasation, pseudoaneurysm, or other unexpected arterial finding. 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 Selective superior mesenteric arteriography demonstrated no active extravasation, pseudoaneurysm, or other vascular abnormality. The proper hepatic artery originates from the gastroduodenal artery, an anatomic variant. Venous phase confirms patency of  superior mesenteric vein and portal vein with antegrade flow. Inferior mesenteric arteriography demonstrates several extraluminal foci of active extravasation in the left retroperitoneum, corresponding to findings on recent CT. Subselective micro catheter arteriography localized the segmental supply to an area of active extravasation at the level of the proximal descending colon. This area was coil embolized, and follow-up arteriography demonstrated no further active extravasation. Sub selective micro catheter arteriography at the level of the sigmoid colon failed to localize the branches supplying the smaller site of active extravasation. Subselective micro catheter arteriography at the level of the distal descending colon localized a site of active extravasation, and once the supplying vessels were identified, septal sub selective coil Embolization was performed. Follow-up arteriography demonstrated no further extravasation in this region. IMPRESSION: 1. At least 3 separate foci of active extravasation in the left lower  retroperitoneum from small branches of the inferior mesenteric artery circulation. 2. Technically successful coil embolization of 2 sites of active extravasation. Sub selective supply to the third site at the level of the sigmoid colon could not be localized. Critical Value/emergent results were discussed with Dr. Carolynne Edouardoth from surgery, who verbally acknowledged these results. 3. Splenic vein occlusion, as previously documented. Electronically Signed   By: Corlis Leak  Hassell M.D.   On: 02/10/2015 16:23   Dg Chest Portable 1 View  02/09/2015  CLINICAL DATA:  29 year old female with severe lower abdominal pain and shortness of breath. EXAM: PORTABLE CHEST 1 VIEW COMPARISON:  Chest x-ray 01/31/2015. FINDINGS: Small bilateral pleural effusions (left greater than right). Bibasilar opacities favored to predominantly reflect subsegmental atelectasis (underlying airspace consolidation in the left lower lobe is not excluded). Mid to upper lungs are clear. Pulmonary venous congestion, without frank pulmonary edema. Heart size appears borderline enlarged. Upper mediastinal contours are slightly distorted by patient positioning. IMPRESSION: 1. Small bilateral pleural effusions (left greater than right) with probable bibasilar subsegmental atelectasis. 2. Borderline cardiomegaly with mild pulmonary venous congestion, but no frank pulmonary edema. Electronically Signed   By: Trudie Reedaniel  Entrikin M.D.   On: 02/09/2015 21:10   Ir Embo Art  Ven Hemorr Lymph [employer] ScriptsExtrav  Inc Guide Roadmapping  02/10/2015  CLINICAL DATA:  Acute pancreatitis with retroperitoneal fluid collections, splenic vein thrombosis, on anticoagulation. Recent CT demonstrates new left retroperitoneal hematoma with multifocal regions of active extravasation. Significant blood loss requiring transfusion. EXAM: SELECTIVE VISCERAL ARTERIOGRAPHY; IR EMBO ART VEN HEMORR LYMPH EXTRAV INC GUIDE ROADMAPPING; ADDITIONAL ARTERIOGRAPHY; IR ULTRASOUND GUIDANCE VASC ACCESS RIGHT  MEDICATIONS: Intravenous Fentanyl and Versed were administered as conscious sedation during continuous cardiorespiratory monitoring by the radiology RN, with a total moderate sedation time of 85 minutes. CONTRAST:  194mL OMNIPAQUE IOHEXOL 300 MG/ML  SOLN FLUOROSCOPY TIME:  3.5 minutes, 48 51  uGym2 DAP PROCEDURE: The procedure, risks (including but not limited to bleeding, infection, organ damage ), benefits, and alternatives were explained to the patient. Questions regarding the procedure were encouraged and answered. The patient understands and consents to the procedure. Site was marked, prepped with Betadine, draped in usual sterile fashion, infiltrated locally with 1% lidocaine. Under ultrasound, right common femoral artery localized. Under real-time ultrasound guidance, the vessel was accessed with a 21-gauge micropuncture needle, exchanged over a 018 guidewire for a transitional dilator, through which a 035 guidewire was advanced. A 5 French vascular sheath was placed. A 5 French C2 catheter advanced and used to selectively catheterize the splenic artery for selective arteriography. Catheter was then pulled back into the celiac axis for additional selective arteriography. Catheter then used to selectively  catheterize the superior mesenteric artery for selective arteriography in multiple projections. The catheter was exchanged over the guidewire for a RIM catheter for additional SMA arteriography. The RIM was exchanged back for the C2, used to selectively catheterize the inferior mesenteric artery for selective arteriography in multiple projections. A renegade micro catheter was then coaxially advanced with the aid of a Fathom guidewire and used to selectively catheterize 3 different third order branches of the IMA for additional supra selective arteriography. Active extravasation at the level of the proximal descending colon was treated with 2 mm coil embolization. Follow-up arteriography was performed. The  micro catheter was then redirected and coil embolization of a second site of active extravasation at the level of the distal descending colon was performed. Follow-up arteriography was performed. The micro catheter and C2 were then withdrawn. After confirmatory femoral arteriogram, the sheath was removed and hemostasis achieved with the aid of Exoseal device. The patient tolerated the procedure well. COMPLICATIONS: None immediate FINDINGS: Selective splenic and celiac arteriography demonstrated no active extravasation, pseudoaneurysm, or other unexpected arterial finding. 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 Selective superior mesenteric arteriography demonstrated no active extravasation, pseudoaneurysm, or other vascular abnormality. The proper hepatic artery originates from the gastroduodenal artery, an anatomic variant. Venous phase confirms patency of superior mesenteric vein and portal vein with antegrade flow. Inferior mesenteric arteriography demonstrates several extraluminal foci of active extravasation in the left retroperitoneum, corresponding to findings on recent CT. Subselective micro catheter arteriography localized the segmental supply to an area of active extravasation at the level of the proximal descending colon. This area was coil embolized, and follow-up arteriography demonstrated no further active extravasation. Sub selective micro catheter arteriography at the level of the sigmoid colon failed to localize the branches supplying the smaller site of active extravasation. Subselective micro catheter arteriography at the level of the distal descending colon localized a site of active extravasation, and once the supplying vessels were identified, septal sub selective coil Embolization was performed. Follow-up arteriography demonstrated no further extravasation in this region. IMPRESSION: 1. At least 3 separate foci of active  extravasation in the left lower retroperitoneum from small branches of the inferior mesenteric artery circulation. 2. Technically successful coil embolization of 2 sites of active extravasation. Sub selective supply to the third site at the level of the sigmoid colon could not be localized. Critical Value/emergent results were discussed with Dr. Carolynne Edouard from surgery, who verbally acknowledged these results. 3. Splenic vein occlusion, as previously documented. Electronically Signed   By: Corlis Leak M.D.   On: 02/10/2015 16:23    Medications: . anidulafungin  100 mg Intravenous Q24H  . gabapentin  300 mg Oral BID WC  . gabapentin  600 mg Oral QHS  . imipenem-cilastatin  500 mg Intravenous 4 times per day  . loratadine  10 mg Oral Daily  . montelukast  10 mg Oral Daily  . multivitamin with minerals  1 tablet Oral Daily  . pantoprazole  40 mg Oral Daily    Assessment/Plan Alcoholic pancreatitis/splenic Vein thrombosis/pancreatic pseudocyst - on Coumadin/Lovenox bridge Intraabdominal bleed on Coumadin S/p Mesenteric angiography, IR emobolization of IMA branches x 2  02/10/15, Dr. Oley Balm Acute alcoholic pancreatitis Hypertension  DVT:  SCD, no anticoagulation secondary to bleed Antibiotics:  Primaxin 10/13-10/20 (7 days) Cefuroxime tab + Flagyl tabs 10/21-10/23 (3 days), Now on Anidulafungin, Cefuroxime, and Primaxin day 2   Plan:  Hemodynamically stable, H/H is stable, no acute surgical issues currently.  LOS: 2 days    Daysean Tinkham 02/11/2015

## 2015-02-11 NOTE — Progress Notes (Signed)
Date: February 11, 2015 Chart reviewed for concurrent status and case management needs. Will continue to follow patient for changes and needs: Rhonda Davis, RN, BSN, CCM   [phone] 

## 2015-02-11 NOTE — Progress Notes (Signed)
PULMONARY / CRITICAL CARE MEDICINE   Name: Kara [name] MRN: [id number] DOB: 1985-04-29    ADMISSION DATE:  02/09/2015 CONSULTATION DATE:  02/10/15  REFERRING MD :  Dr. Darnelle Catalan   CHIEF COMPLAINT:  Abdominal Pain   INITIAL PRESENTATION: 29 y/o F with recent hospitalization (10/12-10/23) for alcoholic pancreatitis complicated by splenic vein thrombosis (discharged on lovenox / coumadin) who presented to [facility] on 10/23 with acute abdominal pain.  CTA of abdomen demonstrated concern for new intra-abdominal hematoma, inflammation and evolving pseudocysts.  Developed hypotension and PCCM consulted for evaluation of hypotension.    STUDIES:  10/23  CTA ABD/Pelvis >> interval development of a large heterogenous 13.7x10x7.4 cm fluid collection in L mid abdomen/LLQ tracking inferiorly from teh pancreatitic tail anterior to the left kidney & L psoas muscle concerning for intra-abdominal hematoma, multiple small areas of active bleeding.  Additional new retroperitoneal dense soft tissue inflammation at the level of the aortic bifurcation 7.5x5.2x2.9 cm concerning for extension of soft tissue inflammation into the inferior retroperitoneum.  New evolving pseudocysts in pancreatic tail up to 5.2 cm in size.  Evolving pseudocyst at the fundus of the stomach with complex fluid tracking about the spleen, complex fluid in the pelvis, small L pleural effusion, diffuse fatty infiltration of the liver  SIGNIFICANT EVENTS: 10/12 - 10/23  Admit for alcoholic pancreatitis, complicated by splenic vein thrombosis 10/23- Readmit for abd pain with intrabd hematoma. 10/24- IR embolization of IMA branches X 2.   HISTORY OF PRESENT ILLNESS:  29 y/o F with a PMH of asthma, GERD, seasonal allergies, hypertension, prolonged QT syndrome, headaches, alcohol abuse (drinks approximately 4 shots per day on the weekend and occasional during the week) and a recent hospitalization (10/12-10/23) for alcoholic pancreatitis complicated by  splenic vein thrombosis (discharged on lovenox / coumadin) who presented to [facility] on 10/23 with acute abdominal pain.    The patient reported 6 hours after discharge she developed intense abdominal pain, nausea/vomiting and bilateral lower extremity swelling. CTA of abdomen demonstrated concern for new intra-abdominal hematoma, inflammation and evolving pseudocysts. She reported she had picked up her new prescriptions but had not had the opportunity to take them as prescribed. She activated EMS and was transported to the [facility] ER for further evaluation. On arrival she reported she had taken prescribed oxycodone for pain without relief. He described the pain is different from her pancreatitis-type pain.  ER workup notable for negative urinalysis and documented benign pelvic exam. Vaginal ultrasound ruled out ovarian torsion. She was noted to be tachycardic with stable blood pressure and hemoglobin.  CT of the abdomen was evaluated which was concerning for interval development of a large heterogenous 13.7x10x7.4 cm fluid collection in L mid abdomen/LLQ tracking inferiorly from teh pancreatitic tail anterior to the left kidney & L psoas muscle concerning for intra-abdominal hematoma, multiple small areas of active bleeding.  Additional new retroperitoneal dense soft tissue inflammation at the level of the aortic bifurcation 7.5x5.2x2.9 cm concerning for extension of soft tissue inflammation into the inferior retroperitoneum.  New evolving pseudocysts in pancreatic tail up to 5.2 cm in size.  Evolving pseudocyst at the fundus of the stomach with complex fluid tracking about the spleen, complex fluid in the pelvis, small L pleural effusion, diffuse fatty infiltration of the liver.  INR on admission was 2.3. She is treated with [facility].  The patient was admitted per [facility] for further care. The morning of 10/24 she developed hypotension and PCCM was consulted for evaluation.  The  patient had  been treated with a 500 mL bolus with improvement in blood pressure. [facility] with recommendations for INR reversal, follow-up lab work, and possible IR evaluation.  The patient currently reports sensation of feeling like she would pass out when she ambulated to the bathroom. She describes the event as her hearing "narrowing" but then it passed.  She reports abdominal pain/discomfort and feels like her abdomen is tight. She also reports blood clots in her stool.  PAST MEDICAL HISTORY :   has a past medical history of Asthma; GERD (gastroesophageal reflux disease); Alcohol abuse; Environmental allergies; Headache; Hypertension; Syncope and collapse; Vision abnormalities; and Prolonged QT syndrome.  has past surgical history that includes Tympanostomy tube placement and Esophagogastroduodenoscopy (N/A, 07/17/2014).    Prior to Admission medications   Medication Sig Start Date End Date Taking? Authorizing Provider  albuterol (PROVENTIL HFA;VENTOLIN HFA) 108 (90 BASE) MCG/ACT inhaler Inhale 2 puffs into the lungs every 6 (six) hours as needed for wheezing or shortness of breath (wheezing and sob).    Yes Historical Provider, MD  cefUROXime (CEFTIN) 500 MG tablet Take 1 tablet (500 mg total) by mouth 2 (two) times daily with a meal. For 4days 02/09/15  Yes Zannie CovePreetha Joseph, MD  fexofenadine (ALLEGRA) 180 MG tablet Take 180 mg by mouth daily as needed for allergies (allergies).    Yes Historical Provider, MD  gabapentin (NEURONTIN) 300 MG capsule One in am, one in evening and two po at night Patient taking differently: Take 300-600 mg by mouth 3 (three) times daily. One in am, one in evening and two po at night 09/02/14  Yes Asa Lenteichard A Sater, MD  metoprolol succinate (TOPROL-XL) 25 MG 24 hr tablet Take 1 tablet (25 mg total) by mouth daily. 06/17/14  Yes Wendall StadePeter C Nishan, MD  metroNIDAZOLE (FLAGYL) 500 MG tablet Take 1 tablet (500 mg total) by mouth every 8 (eight) hours.  For 4days 02/09/15  Yes Zannie CovePreetha Joseph, MD  mometasone-formoterol [facility](DULERA) 200-5 MCG/ACT AERO Inhale 2 puffs into the lungs 2 (two) times daily.   Yes Historical Provider, MD  montelukast (SINGULAIR) 10 MG tablet Take 10 mg by mouth daily.    Yes Historical Provider, MD  Multiple Vitamin (MULTIVITAMIN WITH MINERALS) TABS tablet Take 1 tablet by mouth daily. 07/19/14  Yes Belkys A Regalado, MD  oxyCODONE (OXY IR/ROXICODONE) 5 MG immediate release tablet Take 1 tablet (5 mg total) by mouth every 6 (six) hours as needed for severe pain. 02/09/15  Yes Zannie CovePreetha Joseph, MD  potassium chloride SA (K-DUR,KLOR-CON) 20 MEQ tablet Take 1 tablet (20 mEq total) by mouth daily. Take while on Lasix 02/09/15  Yes Zannie CovePreetha Joseph, MD  furosemide (LASIX) 20 MG tablet Take 1 tablet (20 mg total) by mouth 2 (two) times daily. For 3-4days, for leg swelling Patient not taking: Reported on 02/09/2015 02/09/15   Zannie CovePreetha Joseph, MD  ondansetron (ZOFRAN) 4 MG tablet Take 1 tablet (4 mg total) by mouth every 8 (eight) hours as needed for nausea or vomiting. Patient not taking: Reported on 02/09/2015 02/09/15   Zannie CovePreetha Joseph, MD  polyethylene glycol [facility](MIRALAX / Ethelene HalGLYCOLAX) packet Take 17 g by mouth daily as needed. Patient not taking: Reported on 10/29/2014 07/19/14   Belkys A Regalado, MD  warfarin (COUMADIN) 5 MG tablet Take 1 tablet (5 mg total) by mouth daily. Dose may be adjusted based on INR check Wednesday Patient not taking: Reported on 02/09/2015 02/09/15   Zannie CovePreetha Joseph, MD   Allergies  Allergen Reactions  . Peanuts [Peanut Oil] Itching    Sensitivity     FAMILY HISTORY:  indicated that her mother is alive. She indicated that her father is deceased.    SOCIAL HISTORY:  reports that she quit smoking about 2 years ago. Her smoking use included Cigarettes. She has a 1 pack-year smoking history. She has never used smokeless tobacco. She reports that she drinks about 1.8 oz of alcohol per week. She reports that she does  not use illicit drugs.  SUBJECTIVE:   VITAL SIGNS: Temp:  [98.9 F (37.2 C)-102 F (38.9 C)] 100.2 F (37.9 C) (10/25 0758) Pulse Rate:  [121-141] 126 (10/25 0800) Resp:  [22-45] 26 (10/25 0800) BP: (89-143)/(58-96) 143/87 mmHg (10/25 0800) SpO2:  [89 %-99 %] 94 % (10/25 0800) Weight:  [221 lb 5.5 oz (100.4 kg)] 221 lb 5.5 oz (100.4 kg) (10/24 0940)   HEMODYNAMICS: CVP:  [5 mmHg-9 mmHg] 8 mmHg   VENTILATOR SETTINGS:     INTAKE / OUTPUT:  Intake/Output Summary (Last 24 hours) at 02/11/15 0905 Last data filed at 02/11/15 0800  Gross per 24 hour  Intake   6875 ml  Output    815 ml  Net   6060 ml    PHYSICAL EXAMINATION: General: Mild distress, Awake, oriented X 3.  Neuro:  No focal deficits,  HEENT:  Moist mucus membranes, no JVD or lymphadenopathy Cardiovascular:  S1, S2, No MRG Lungs:  Clear, No wheeze or crackles. Abdomen:  Obese/soft, tender to palpation Musculoskeletal:  No acute deformities  Skin: 1-2+ pitting edema in BLE  LABS:  CBC  Recent Labs Lab 02/09/15 2040 02/10/15 0157 02/10/15 1300 02/10/15 2057 02/11/15 0509  WBC 15.7* 17.0*  --   --  21.2*  HGB 11.7* 11.2* 5.7* 9.2* 8.7*  HCT 36.3 34.1* 17.3* 27.1* 26.2*  PLT 412* 403*  --   --  303   Coag's  Recent Labs Lab 02/10/15 1100 02/11/15 0126 02/11/15 0509  INR 1.40 1.22 1.20   BMET  Recent Labs Lab 02/09/15 2040 02/10/15 0157 02/11/15 0509  NA 136 135 138  K 4.4 4.2 3.7  CL 101 102 105  CO2 BUN <5* <5* 8  CREATININE 0.50 0.56 0.65  GLUCOSE 130* 142* 123*   Electrolytes  Recent Labs Lab 02/09/15 2040 02/10/15 0157 02/11/15 0509  CALCIUM 8.7* 8.2* 8.2*     Sepsis Markers No results for input(s): LATICACIDVEN, PROCALCITON, O2SATVEN in the last 168 hours.   ABG No results for input(s): PHART, PCO2ART, PO2ART in the last 168 hours.   Liver Enzymes  Recent Labs Lab 02/09/15 0512 02/09/15 2040 02/11/15 0509  AST 53* 50* 64*  ALT ALKPHOS  198* 200* 128*  BILITOT 1.1 1.6* 2.0*  ALBUMIN 2.4* 2.5* 2.7*   Cardiac Enzymes No results for input(s): TROPONINI, PROBNP in the last 168 hours.   Glucose No results for input(s): GLUCAP in the last 168 hours.  Imaging No new imaging today.   ASSESSMENT / PLAN:  PULMONARY OETT A: At Risk Respiratory Failure - in the setting of intraabdominal hematoma / compromised respiratory mechanics Small Left Pleural Effusion  P:   Pulmonary hygiene: IS Oxygen if needed to support sats > 92%  CARDIOVASCULAR CVL 10/24 >>  A:  Hypovolemic Shock - responding to volume resuscitation, in setting of intraabdominal hematoma  Tachycardia - secondary to above  Poor Venous Access - unable to stick for labs  LE Edema - in  setting of poor intake / low albumin, recent critical illness  Hx HTN P:  NS at 125 ml/hr Trend CVP Q4   RENAL A:   At Risk AKI - in setting of volume depletion / hematoma, contrast for ABD CT P:   Saline as above  Monitor BMP / UOP  Replace electrolytes as indicated   GASTROINTESTINAL A:   Intraabdominal Hematoma - on lovenox / coumadin. S/p IR embolization of IMA branches X 2 Resolving Alcoholic Pancreatitis  Splenic Vein Thrombosis - d/c'd on lovenox / coumadin Abdominal Pain  Hypoalbuminemia - in setting of poor intake / recent illness  P:   Difficult to control abdominal pain due to hypotension  CCS following, appreciate input. No indication for surgical intervention at this time Follow H/H transfuse for Hb <7 Clear liq diet.  HEMATOLOGIC A:   Intraabdominal Hematoma Coagulopathy secondary to lovenox / coumadin  Leukocytosis  P:  INR reversed Hold further anticoagulation  Trend PT/INR Monitor CBC  S/P FFP x5.5 units H/H Q8   INFECTIOUS A:   Resolving Alcoholic Pancreatitis  Pancreatic Pseudocysts - no indication of necrosis on CT P:   10/23 Wet Prep >> neg   Imipenem (resolving pancreatitis, new areas of complex fluid on CT) 10/24 >>   Andidulafungin 10/14 >>  Check procalcitonin. Will narrow if negative.   ENDOCRINE A:   Hyperglycemia - mild P:   Trend glucose on BMP  If consistently > 180 add SSI   NEUROLOGIC A:   Pain  Anxiety  P:   RASS goal: n/a PRN fentanyl for pain    FAMILY  - Updates: Patient updated at bedside.  No family available at present.  - Inter-disciplinary family meet or Palliative Care meeting due by:  10/31  Critical care time- 40 mins  Chilton Greathouse MD [facility] Pager [phone] If no answer or after 3pm call: [phone] 02/11/2015, 9:26 AM

## 2015-02-11 NOTE — Progress Notes (Signed)
Patient has had multiple loose stools. MD aware. No labs indicated at this time per MD.

## 2015-02-11 NOTE — Progress Notes (Signed)
Initial Nutrition Assessment  DOCUMENTATION CODES:   Obesity unspecified  INTERVENTION:  Monitor intake/tolerance of diet advancement   NUTRITION DIAGNOSIS:   Inadequate oral intake related to altered GI function as evidenced by per patient/family report.  GOAL:   Patient will meet greater than or equal to 90% of their needs   MONITOR:   Diet advancement, PO intake, Weight trends, Labs, I & O's  REASON FOR ASSESSMENT:   Consult Assessment of nutrition requirement/status  ASSESSMENT:   Kara [name] is a 29 y.o. female with recent hospitalization for Alcoholic Pancreatitis and Splenic Vein Thrombosis Who was discharged on 10/23 and returns due to complaints of 10/10 pain in the lower ABD and Lower back Described as dull and constant. An extensive workup was performed in the ED, and a Ct scan of the ABD /Pelvis revealed an Intra-Abdominal Hematoma. Her PT/INR was found to be 2.29, and Genreral Surgery was consulted. FFP and Vitamin K was ordered to reverse her INR and [facility].   Patient states she has no issue with appetite, nausea/vomitting or diarrhea prior to admission. GI bleed & Alcoholic Pancreatitis have blunted her appetite as of late but normally eats well.  She is exhibiting significant edema in her lower extremities and in her upper body.  INR is now WNL PT is still slightly high (15.3)   Medications reviewed.  Follow for tolerance of diet/diet advancement.  Diet Order:  Diet clear liquid Room service appropriate?: Yes; Fluid consistency:: Thin  Skin:  Wound (see comment) (Bilateral ecchymosis to arm and abdomen.)  Last BM:  02/11/2015  Height:   Ht Readings from Last 1 Encounters:  02/10/15 5\' 5"  (1.651 m)    Weight:   Wt Readings from Last 1 Encounters:  02/11/15 250 lb 3.6 oz (113.5 kg)    Ideal Body Weight:  63.64 kg  BMI:  Body mass index is 41.64 kg/(m^2).  Estimated Nutritional Needs:    Kcal:  1600 - 1800  Protein:  70 - 80 grams  Fluid:  >/= 1.6L  EDUCATION NEEDS:   No education needs identified at this time  Dionne AnoWilliam M. Marymargaret Kirker, MS, RD LDN After Hours/Weekend Pager [phone]

## 2015-02-11 NOTE — Progress Notes (Signed)
Referring Physician(s): CCS/CCM  Chief Complaint: Intraabdominal bleed s/p Mesenteric arteriogram, subselective coil embolization   Subjective: Patient states her RLQ pain and back pain have improved since yesterday's procedure, she admits to new left periumbilical tenderness today. She denies any right groin access site pain or bleeding.   Allergies: Peanuts  Medications: Prior to Admission medications   Medication Sig Start Date End Date Taking? Authorizing Provider  albuterol (PROVENTIL HFA;VENTOLIN HFA) 108 (90 BASE) MCG/ACT inhaler Inhale 2 puffs into the lungs every 6 (six) hours as needed for wheezing or shortness of breath (wheezing and sob).    Yes Historical Provider, MD  cefUROXime (CEFTIN) 500 MG tablet Take 1 tablet (500 mg total) by mouth 2 (two) times daily with a meal. For 4days 02/09/15  Yes Zannie CovePreetha Joseph, MD  fexofenadine (ALLEGRA) 180 MG tablet Take 180 mg by mouth daily as needed for allergies (allergies).    Yes Historical Provider, MD  gabapentin (NEURONTIN) 300 MG capsule One in am, one in evening and two po at night Patient taking differently: Take 300-600 mg by mouth 3 (three) times daily. One in am, one in evening and two po at night 09/02/14  Yes Asa Lenteichard A Sater, MD  metoprolol succinate (TOPROL-XL) 25 MG 24 hr tablet Take 1 tablet (25 mg total) by mouth daily. 06/17/14  Yes Wendall StadePeter C Nishan, MD  metroNIDAZOLE (FLAGYL) 500 MG tablet Take 1 tablet (500 mg total) by mouth every 8 (eight) hours. For 4days 02/09/15  Yes Zannie CovePreetha Joseph, MD  mometasone-formoterol [facility](DULERA) 200-5 MCG/ACT AERO Inhale 2 puffs into the lungs 2 (two) times daily.   Yes Historical Provider, MD  montelukast (SINGULAIR) 10 MG tablet Take 10 mg by mouth daily.    Yes Historical Provider, MD  Multiple Vitamin (MULTIVITAMIN WITH MINERALS) TABS tablet Take 1 tablet by mouth daily. 07/19/14  Yes Belkys A Regalado, MD  oxyCODONE (OXY IR/ROXICODONE) 5 MG immediate release tablet Take 1 tablet (5 mg  total) by mouth every 6 (six) hours as needed for severe pain. 02/09/15  Yes Zannie CovePreetha Joseph, MD  potassium chloride SA (K-DUR,KLOR-CON) 20 MEQ tablet Take 1 tablet (20 mEq total) by mouth daily. Take while on Lasix 02/09/15  Yes Zannie CovePreetha Joseph, MD  furosemide (LASIX) 20 MG tablet Take 1 tablet (20 mg total) by mouth 2 (two) times daily. For 3-4days, for leg swelling Patient not taking: Reported on 02/09/2015 02/09/15   Zannie CovePreetha Joseph, MD  ondansetron (ZOFRAN) 4 MG tablet Take 1 tablet (4 mg total) by mouth every 8 (eight) hours as needed for nausea or vomiting. Patient not taking: Reported on 02/09/2015 02/09/15   Zannie CovePreetha Joseph, MD  polyethylene glycol [facility](MIRALAX / Ethelene HalGLYCOLAX) packet Take 17 g by mouth daily as needed. Patient not taking: Reported on 10/29/2014 07/19/14   Belkys A Regalado, MD  warfarin (COUMADIN) 5 MG tablet Take 1 tablet (5 mg total) by mouth daily. Dose may be adjusted based on INR check Wednesday Patient not taking: Reported on 02/09/2015 02/09/15   Zannie CovePreetha Joseph, MD   Vital Signs: BP 134/74 mmHg  Pulse 130  Temp(Src) 100.4 F (38 C) (Oral)  Resp 28  Ht 5\' 5"  (1.651 m)  Wt 250 lb 3.6 oz (113.5 kg)  BMI 41.64 kg/m2  SpO2 95%  LMP 12/30/2014 (LMP Unknown)  Physical Exam General: A&Ox3, NAD Abd: Soft, left sided TTP Ext: RCFA access dressing C/D/I, NT, no signs of bleeding/hematoma, 2+ pitting edema b/l  Imaging: See chart review-imaging   Labs:  CBC:  Recent  Labs  02/09/15 0512 02/09/15 2040 02/10/15 0157 02/10/15 1300 02/10/15 2057 02/11/15 0509  WBC 11.2* 15.7* 17.0*  --   --  21.2*  HGB 11.4* 11.7* 11.2* 5.7* 9.2* 8.7*  HCT 35.5* 36.3 34.1* 17.3* 27.1* 26.2*  PLT 363 412* 403*  --   --  303    COAGS:  Recent Labs  07/27/14 1733  02/10/15 0157 02/10/15 1100 02/11/15 0126 02/11/15 0509  INR 0.95  < > 2.17* 1.40 1.22 1.20  APTT 29  --   --   --   --   --   < > = values in this interval not displayed.  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

## 2015-02-12 ENCOUNTER — Inpatient Hospital Stay (HOSPITAL_COMMUNITY): Payer: BC Managed Care – PPO

## 2015-02-12 DIAGNOSIS — M79604 Pain in right leg: Secondary | ICD-10-CM

## 2015-02-12 DIAGNOSIS — S3690XD Unspecified injury of unspecified intra-abdominal organ, subsequent encounter: Secondary | ICD-10-CM

## 2015-02-12 DIAGNOSIS — M79601 Pain in right arm: Secondary | ICD-10-CM

## 2015-02-12 LAB — HEMOGLOBIN AND HEMATOCRIT, BLOOD
HCT: 24 % — ABNORMAL LOW (ref 36.0–46.0)
Hemoglobin: 7.9 g/dL — ABNORMAL LOW (ref 12.0–15.0)

## 2015-02-12 LAB — CBC
HEMATOCRIT: 24.1 % — AB (ref 36.0–46.0)
Hemoglobin: 7.9 g/dL — ABNORMAL LOW (ref 12.0–15.0)
MCH: 31.3 pg (ref 26.0–34.0)
MCHC: 32.8 g/dL (ref 30.0–36.0)
MCV: 95.6 fL (ref 78.0–100.0)
Platelets: 325 10*3/uL (ref 150–400)
RBC: 2.52 MIL/uL — ABNORMAL LOW (ref 3.87–5.11)
RDW: 16.3 % — AB (ref 11.5–15.5)
WBC: 22 10*3/uL — AB (ref 4.0–10.5)

## 2015-02-12 LAB — MAGNESIUM
Magnesium: 1.4 mg/dL — ABNORMAL LOW (ref 1.7–2.4)
Magnesium: 1.5 mg/dL — ABNORMAL LOW (ref 1.7–2.4)

## 2015-02-12 LAB — PROTIME-INR
INR: 1.31 (ref 0.00–1.49)
PROTHROMBIN TIME: 16.4 s — AB (ref 11.6–15.2)

## 2015-02-12 LAB — BASIC METABOLIC PANEL
Anion gap: 6 (ref 5–15)
BUN: 8 mg/dL (ref 6–20)
CHLORIDE: 106 mmol/L (ref 101–111)
CO2: 23 mmol/L (ref 22–32)
CREATININE: 0.52 mg/dL (ref 0.44–1.00)
Calcium: 7.8 mg/dL — ABNORMAL LOW (ref 8.9–10.3)
GFR calc non Af Amer: >60 mL/min (ref >60)
GLUCOSE: 131 mg/dL — AB (ref 65–99)
Potassium: 2.9 mmol/L — ABNORMAL LOW (ref 3.5–5.1)
Sodium: 135 mmol/L (ref 135–145)

## 2015-02-12 LAB — PHOSPHORUS: Phosphorus: 2 mg/dL — ABNORMAL LOW (ref 2.5–4.6)

## 2015-02-12 LAB — PROCALCITONIN: PROCALCITONIN: 47.33 ng/mL

## 2015-02-12 LAB — LIPASE, BLOOD: LIPASE: 21 U/L (ref 11–51)

## 2015-02-12 MED ORDER — POTASSIUM CHLORIDE CRYS ER 20 MEQ PO TBCR
40.0000 meq | EXTENDED_RELEASE_TABLET | Freq: Once | ORAL | Status: AC
  Administered 2015-02-12: 40 meq via ORAL
  Filled 2015-02-12: qty 2

## 2015-02-12 MED ORDER — OXYCODONE HCL 5 MG PO TABS
5.0000 mg | ORAL_TABLET | Freq: Four times a day (QID) | ORAL | Status: DC | PRN
  Administered 2015-02-12 – 2015-02-15 (×9): 5 mg via ORAL
  Filled 2015-02-12 (×15): qty 1

## 2015-02-12 MED ORDER — FUROSEMIDE 10 MG/ML IJ SOLN
20.0000 mg | Freq: Once | INTRAMUSCULAR | Status: AC
Start: 2015-02-12 — End: 2015-02-12
  Administered 2015-02-12: 20 mg via INTRAVENOUS
  Filled 2015-02-12: qty 2

## 2015-02-12 MED ORDER — POTASSIUM CHLORIDE 10 MEQ/50ML IV SOLN
10.0000 meq | INTRAVENOUS | Status: AC
  Administered 2015-02-12 (×3): 10 meq via INTRAVENOUS
  Filled 2015-02-12 (×4): qty 50

## 2015-02-12 MED ORDER — DEXTROSE 5 % IV SOLN
40.0000 meq | Freq: Once | INTRAVENOUS | Status: AC
  Administered 2015-02-12: 40 meq via INTRAVENOUS
  Filled 2015-02-12: qty 9.09

## 2015-02-12 MED ORDER — MAGNESIUM SULFATE 2 GM/50ML IV SOLN
2.0000 g | Freq: Once | INTRAVENOUS | Status: AC
  Administered 2015-02-12: 2 g via INTRAVENOUS
  Filled 2015-02-12: qty 50

## 2015-02-12 NOTE — Progress Notes (Signed)
Patient ID: Kara [name], female   DOB: 09/11/1985, 29 y.o.   MRN: [id number] Pt doing ok; denies any further bleeding; has some abd soreness; recent coil embo of IMA branches; VSS;temp 101 (1200); puncture site right CFA nontender with no hematoma; LE pulses + by doppler; venous doppler neg; foot warm, sens/motor fxn intact; plans as per CCM.

## 2015-02-12 NOTE — Progress Notes (Signed)
Date: February 12, 2015 Chart reviewed for concurrent status and case management needs. Will continue to follow patient for changes and needs: Rhonda Davis, RN, BSN, CCM   [phone] 

## 2015-02-12 NOTE — Progress Notes (Signed)
Subjective: Crying in bed with pain right leg, started hurting last PM. She can lift it but with some difficulty.  Right leg more swollen than the left with +2-3 edema feet and below knees.  She has good doppler pulses in both and they are warm and pink.  Still has allot of pain in the Left side of her abdomen.  No hematoma or ecchymosis noted anterior abdomen on exam.  Objective: Vital signs in last 24 hours: Temp:  [99.2 F (37.3 C)-102.5 F (39.2 C)] 101.2 F (38.4 C) (10/26 0400) Pulse Rate:  [52-136] 113 (10/26 0600) Resp:  [18-40] 21 (10/26 0600) BP: (97-150)/(56-94) 117/71 mmHg (10/26 0600) SpO2:  [91 %-100 %] 95 % (10/26 0600) Weight:  [115.2 kg (253 lb 15.5 oz)] 115.2 kg (253 lb 15.5 oz) (10/26 0358) Last BM Date: 02/11/15 425 URINE RECORDED BM X 2 PO 360 RECORDED Tm 102.5, TACHYCARDIC with some hypotension K+ 2.9WBC still up, H/H drifting down again 7.9/24, from 9.2/27 after transfusion Intake/Output from previous day: 10/25 0701 - 10/26 0700 In: 3612 [P.O.:360; I.V.:2250; IV Piggyback:580] Out: 425 [Urine:425] Intake/Output this shift:    General appearance: alert, cooperative, mild distress and crying with pain RLE Resp: clear to auscultation bilaterally and anterior GI: soft, very tender left side, more LUQ, few BS, +BM  Extremities: both legs and feet with +2-3 edema.  she had good refill, dopper flow, motion and sensation RLE.  Cath site looks fine too.  Lab Results:   Recent Labs  02/11/15 0509  02/12/15 0348 02/12/15 0550  WBC 21.2*  --  22.0*  --   HGB 8.7*  < > 7.9* 7.9*  HCT 26.2*  < > 24.1* 24.0*  PLT 303  --  325  --   < > = values in this interval not displayed.  BMET  Recent Labs  02/11/15 0509 02/12/15 0348  NA 138 135  K 3.7 2.9*  CL 105 106  CO2 24 23  GLUCOSE 123* 131*  BUN 8 8  CREATININE 0.65 0.52  CALCIUM 8.2* 7.8*   PT/INR  Recent Labs  02/11/15 0509 02/12/15 0348  LABPROT 15.3* 16.4*  INR 1.20 1.31     Recent  Labs Lab 02/07/15 0540 02/08/15 0550 02/09/15 0512 02/09/15 2040 02/11/15 0509  AST 68* 74* 53* 50* 64*  ALT 33 31 26 25 22   ALKPHOS 215* 217* 198* 200* 128*  BILITOT 1.5* 1.3* 1.1 1.6* 2.0*  PROT 6.0* 5.8* 6.0* 6.3* 6.2*  ALBUMIN 2.3* 2.3* 2.4* 2.5* 2.7*     Lipase     Component Value Date/Time   LIPASE 21 02/12/2015 0348     Studies/Results: Dg Chest 1 View  02/10/2015  CLINICAL DATA:  Central line placement EXAM: CHEST 1 VIEW COMPARISON:  02/09/2015 FINDINGS: Left IJ venous catheter terminates in the mid SVC. Patchy right lower lobe opacity, likely atelectasis. Cardiomegaly with pulmonary vascular congestion and possible mild interstitial edema edema. Moderate layering left pleural effusion, mildly increased. Small right pleural effusion. IMPRESSION: Left IJ venous catheter terminates in the mid SVC. Cardiomegaly pulmonary vascular congestion and possible mild interstitial edema. Moderate layering left pleural effusion, mildly increased. Small right pleural effusion. Patchy right lower lobe opacity, likely atelectasis. Electronically Signed   By: Charline Bills M.D.   On: 02/10/2015 11:54   Ir Angiogram Visceral Selective  02/10/2015  CLINICAL DATA:  Acute pancreatitis with retroperitoneal fluid collections, splenic vein thrombosis, on anticoagulation. Recent CT demonstrates new left retroperitoneal hematoma with multifocal regions of active  extravasation. Significant blood loss requiring transfusion. EXAM: SELECTIVE VISCERAL ARTERIOGRAPHY; IR EMBO ART VEN HEMORR LYMPH EXTRAV INC GUIDE ROADMAPPING; ADDITIONAL ARTERIOGRAPHY; IR ULTRASOUND GUIDANCE VASC ACCESS RIGHT MEDICATIONS: Intravenous Fentanyl and Versed were administered as conscious sedation during continuous cardiorespiratory monitoring by the radiology RN, with a total moderate sedation time of 85 minutes. CONTRAST:  OMNIPAQUE IOHEXOL 300 MG/ML  SOLN FLUOROSCOPY TIME:  3.5 minutes, 48 51  uGym2 DAP PROCEDURE: The  procedure, risks (including but not limited to bleeding, infection, organ damage ), benefits, and alternatives were explained to the patient. Questions regarding the procedure were encouraged and answered. The patient understands and consents to the procedure. Site was marked, prepped with Betadine, draped in usual sterile fashion, infiltrated locally with 1% lidocaine. Under ultrasound, right common femoral artery localized. Under real-time ultrasound guidance, the vessel was accessed with a 21-gauge micropuncture needle, exchanged over a 018 guidewire for a transitional dilator, through which a 035 guidewire was advanced. A 5 French vascular sheath was placed. A 5 French C2 catheter advanced and used to selectively catheterize the splenic artery for selective arteriography. Catheter was then pulled back into the celiac axis for additional selective arteriography. Catheter then used to selectively catheterize the superior mesenteric artery for selective arteriography in multiple projections. The catheter was exchanged over the guidewire for a RIM catheter for additional SMA arteriography. The RIM was exchanged back for the C2, used to selectively catheterize the inferior mesenteric artery for selective arteriography in multiple projections. A renegade micro catheter was then coaxially advanced with the aid of a Fathom guidewire and used to selectively catheterize 3 different third order branches of the IMA for additional supra selective arteriography. Active extravasation at the level of the proximal descending colon was treated with 2 mm coil embolization. Follow-up arteriography was performed. The micro catheter was then redirected and coil embolization of a second site of active extravasation at the level of the distal descending colon was performed. Follow-up arteriography was performed. The micro catheter and C2 were then withdrawn. After confirmatory femoral arteriogram, the sheath was removed and hemostasis  achieved with the aid of Exoseal device. The patient tolerated the procedure well. COMPLICATIONS: None immediate FINDINGS: Selective splenic and celiac arteriography demonstrated no active extravasation, pseudoaneurysm, or other unexpected arterial finding. 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 Selective superior mesenteric arteriography demonstrated no active extravasation, pseudoaneurysm, or other vascular abnormality. The proper hepatic artery originates from the gastroduodenal artery, an anatomic variant. Venous phase confirms patency of superior mesenteric vein and portal vein with antegrade flow. Inferior mesenteric arteriography demonstrates several extraluminal foci of active extravasation in the left retroperitoneum, corresponding to findings on recent CT. Subselective micro catheter arteriography localized the segmental supply to an area of active extravasation at the level of the proximal descending colon. This area was coil embolized, and follow-up arteriography demonstrated no further active extravasation. Sub selective micro catheter arteriography at the level of the sigmoid colon failed to localize the branches supplying the smaller site of active extravasation. Subselective micro catheter arteriography at the level of the distal descending colon localized a site of active extravasation, and once the supplying vessels were identified, septal sub selective coil Embolization was performed. Follow-up arteriography demonstrated no further extravasation in this region. IMPRESSION: 1. At least 3 separate foci of active extravasation in the left lower retroperitoneum from small branches of the inferior mesenteric artery circulation. 2. Technically successful coil embolization of 2 sites of active extravasation. Sub selective supply to  the third site at the level of the sigmoid colon could not be localized. Critical Value/emergent results  were discussed with Dr. Carolynne Edouard from surgery, who verbally acknowledged these results. 3. Splenic vein occlusion, as previously documented. Electronically Signed   By: Corlis Leak M.D.   On: 02/10/2015 16:23   Ir Angiogram Visceral Selective  02/10/2015  CLINICAL DATA:  Acute pancreatitis with retroperitoneal fluid collections, splenic vein thrombosis, on anticoagulation. Recent CT demonstrates new left retroperitoneal hematoma with multifocal regions of active extravasation. Significant blood loss requiring transfusion. EXAM: SELECTIVE VISCERAL ARTERIOGRAPHY; IR EMBO ART VEN HEMORR LYMPH EXTRAV INC GUIDE ROADMAPPING; ADDITIONAL ARTERIOGRAPHY; IR ULTRASOUND GUIDANCE VASC ACCESS RIGHT MEDICATIONS: Intravenous Fentanyl and Versed were administered as conscious sedation during continuous cardiorespiratory monitoring by the radiology RN, with a total moderate sedation time of 85 minutes. CONTRAST:  OMNIPAQUE IOHEXOL 300 MG/ML  SOLN FLUOROSCOPY TIME:  3.5 minutes, 48 51  uGym2 DAP PROCEDURE: The procedure, risks (including but not limited to bleeding, infection, organ damage ), benefits, and alternatives were explained to the patient. Questions regarding the procedure were encouraged and answered. The patient understands and consents to the procedure. Site was marked, prepped with Betadine, draped in usual sterile fashion, infiltrated locally with 1% lidocaine. Under ultrasound, right common femoral artery localized. Under real-time ultrasound guidance, the vessel was accessed with a 21-gauge micropuncture needle, exchanged over a 018 guidewire for a transitional dilator, through which a 035 guidewire was advanced. A 5 French vascular sheath was placed. A 5 French C2 catheter advanced and used to selectively catheterize the splenic artery for selective arteriography. Catheter was then pulled back into the celiac axis for additional selective arteriography. Catheter then used to selectively catheterize the superior  mesenteric artery for selective arteriography in multiple projections. The catheter was exchanged over the guidewire for a RIM catheter for additional SMA arteriography. The RIM was exchanged back for the C2, used to selectively catheterize the inferior mesenteric artery for selective arteriography in multiple projections. A renegade micro catheter was then coaxially advanced with the aid of a Fathom guidewire and used to selectively catheterize 3 different third order branches of the IMA for additional supra selective arteriography. Active extravasation at the level of the proximal descending colon was treated with 2 mm coil embolization. Follow-up arteriography was performed. The micro catheter was then redirected and coil embolization of a second site of active extravasation at the level of the distal descending colon was performed. Follow-up arteriography was performed. The micro catheter and C2 were then withdrawn. After confirmatory femoral arteriogram, the sheath was removed and hemostasis achieved with the aid of Exoseal device. The patient tolerated the procedure well. COMPLICATIONS: None immediate FINDINGS: Selective splenic and celiac arteriography demonstrated no active extravasation, pseudoaneurysm, or other unexpected arterial finding. 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 Selective superior mesenteric arteriography demonstrated no active extravasation, pseudoaneurysm, or other vascular abnormality. The proper hepatic artery originates from the gastroduodenal artery, an anatomic variant. Venous phase confirms patency of superior mesenteric vein and portal vein with antegrade flow. Inferior mesenteric arteriography demonstrates several extraluminal foci of active extravasation in the left retroperitoneum, corresponding to findings on recent CT. Subselective micro catheter arteriography localized the segmental supply to an area of active  extravasation at the level of the proximal descending colon. This area was coil embolized, and follow-up arteriography demonstrated no further active extravasation. Sub selective micro catheter arteriography at the level of the sigmoid colon failed to localize the branches supplying  the smaller site of active extravasation. Subselective micro catheter arteriography at the level of the distal descending colon localized a site of active extravasation, and once the supplying vessels were identified, septal sub selective coil Embolization was performed. Follow-up arteriography demonstrated no further extravasation in this region. IMPRESSION: 1. At least 3 separate foci of active extravasation in the left lower retroperitoneum from small branches of the inferior mesenteric artery circulation. 2. Technically successful coil embolization of 2 sites of active extravasation. Sub selective supply to the third site at the level of the sigmoid colon could not be localized. Critical Value/emergent results were discussed with Dr. Toth from [facility], who verbally acknowledged these results. 3. Splenic vein occlusion, as previously documented. Electronically Signed   By: Corlis Leak  Hassell M.D.   On: 02/10/2015 16:23   Ir Angiogram Visceral Selective  02/10/2015  CLINICAL DATA:  Acute pancreatitis with retroperitoneal fluid collections, splenic vein thrombosis, on anticoagulation. Recent CT demonstrates new left retroperitoneal hematoma with multifocal regions of active extravasation. Significant blood loss requiring transfusion. EXAM: SELECTIVE VISCERAL ARTERIOGRAPHY; IR EMBO ART VEN HEMORR LYMPH EXTRAV INC GUIDE ROADMAPPING; ADDITIONAL ARTERIOGRAPHY; IR ULTRASOUND GUIDANCE VASC ACCESS RIGHT MEDICATIONS: Intravenous Fentanyl and Versed were administered as conscious sedation during continuous cardiorespiratory monitoring by the radiology RN, with a total moderate sedation time of 85 minutes. CONTRAST:  194mL OMNIPAQUE IOHEXOL 300 MG/ML   SOLN FLUOROSCOPY TIME:  3.5 minutes, 48 51  uGym2 DAP PROCEDURE: The procedure, risks (including but not limited to bleeding, infection, organ damage ), benefits, and alternatives were explained to the patient. Questions regarding the procedure were encouraged and answered. The patient understands and consents to the procedure. Site was marked, prepped with Betadine, draped in usual sterile fashion, infiltrated locally with 1% lidocaine. Under ultrasound, right common femoral artery localized. Under real-time ultrasound guidance, the vessel was accessed with a 21-gauge micropuncture needle, exchanged over a 018 guidewire for a transitional dilator, through which a 035 guidewire was advanced. A 5 French vascular sheath was placed. A 5 French C2 catheter advanced and used to selectively catheterize the splenic artery for selective arteriography. Catheter was then pulled back into the celiac axis for additional selective arteriography. Catheter then used to selectively catheterize the superior mesenteric artery for selective arteriography in multiple projections. The catheter was exchanged over the guidewire for a RIM catheter for additional SMA arteriography. The RIM was exchanged back for the C2, used to selectively catheterize the inferior mesenteric artery for selective arteriography in multiple projections. A renegade micro catheter was then coaxially advanced with the aid of a Fathom guidewire and used to selectively catheterize 3 different third order branches of the IMA for additional supra selective arteriography. Active extravasation at the level of the proximal descending colon was treated with 2 mm coil embolization. Follow-up arteriography was performed. The micro catheter was then redirected and coil embolization of a second site of active extravasation at the level of the distal descending colon was performed. Follow-up arteriography was performed. The micro catheter and C2 were then withdrawn. After  confirmatory femoral arteriogram, the sheath was removed and hemostasis achieved with the aid of Exoseal device. The patient tolerated the procedure well. COMPLICATIONS: None immediate FINDINGS: Selective splenic and celiac arteriography demonstrated no active extravasation, pseudoaneurysm, or other unexpected arterial finding. 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 Selective superior mesenteric arteriography demonstrated no active extravasation, pseudoaneurysm, or other vascular abnormality. The proper hepatic artery originates from the gastroduodenal artery, an anatomic variant. Venous phase  confirms patency of superior mesenteric vein and portal vein with antegrade flow. Inferior mesenteric arteriography demonstrates several extraluminal foci of active extravasation in the left retroperitoneum, corresponding to findings on recent CT. Subselective micro catheter arteriography localized the segmental supply to an area of active extravasation at the level of the proximal descending colon. This area was coil embolized, and follow-up arteriography demonstrated no further active extravasation. Sub selective micro catheter arteriography at the level of the sigmoid colon failed to localize the branches supplying the smaller site of active extravasation. Subselective micro catheter arteriography at the level of the distal descending colon localized a site of active extravasation, and once the supplying vessels were identified, septal sub selective coil Embolization was performed. Follow-up arteriography demonstrated no further extravasation in this region. IMPRESSION: 1. At least 3 separate foci of active extravasation in the left lower retroperitoneum from small branches of the inferior mesenteric artery circulation. 2. Technically successful coil embolization of 2 sites of active extravasation. Sub selective supply to the third site at the level of the  sigmoid colon could not be localized. Critical Value/emergent results were discussed with Dr. Carolynne Edouard from surgery, who verbally acknowledged these results. 3. Splenic vein occlusion, as previously documented. Electronically Signed   By: Corlis Leak M.D.   On: 02/10/2015 16:23   Ir Angiogram Selective Each Additional Vessel  02/10/2015  CLINICAL DATA:  Acute pancreatitis with retroperitoneal fluid collections, splenic vein thrombosis, on anticoagulation. Recent CT demonstrates new left retroperitoneal hematoma with multifocal regions of active extravasation. Significant blood loss requiring transfusion. EXAM: SELECTIVE VISCERAL ARTERIOGRAPHY; IR EMBO ART VEN HEMORR LYMPH EXTRAV INC GUIDE ROADMAPPING; ADDITIONAL ARTERIOGRAPHY; IR ULTRASOUND GUIDANCE VASC ACCESS RIGHT MEDICATIONS: Intravenous Fentanyl and Versed were administered as conscious sedation during continuous cardiorespiratory monitoring by the radiology RN, with a total moderate sedation time of 85 minutes. CONTRAST:  OMNIPAQUE IOHEXOL 300 MG/ML  SOLN FLUOROSCOPY TIME:  3.5 minutes, 48 51  uGym2 DAP PROCEDURE: The procedure, risks (including but not limited to bleeding, infection, organ damage ), benefits, and alternatives were explained to the patient. Questions regarding the procedure were encouraged and answered. The patient understands and consents to the procedure. Site was marked, prepped with Betadine, draped in usual sterile fashion, infiltrated locally with 1% lidocaine. Under ultrasound, right common femoral artery localized. Under real-time ultrasound guidance, the vessel was accessed with a 21-gauge micropuncture needle, exchanged over a 018 guidewire for a transitional dilator, through which a 035 guidewire was advanced. A 5 French vascular sheath was placed. A 5 French C2 catheter advanced and used to selectively catheterize the splenic artery for selective arteriography. Catheter was then pulled back into the celiac axis for additional  selective arteriography. Catheter then used to selectively catheterize the superior mesenteric artery for selective arteriography in multiple projections. The catheter was exchanged over the guidewire for a RIM catheter for additional SMA arteriography. The RIM was exchanged back for the C2, used to selectively catheterize the inferior mesenteric artery for selective arteriography in multiple projections. A renegade micro catheter was then coaxially advanced with the aid of a Fathom guidewire and used to selectively catheterize 3 different third order branches of the IMA for additional supra selective arteriography. Active extravasation at the level of the proximal descending colon was treated with 2 mm coil embolization. Follow-up arteriography was performed. The micro catheter was then redirected and coil embolization of a second site of active extravasation at the level of the distal descending colon was performed. Follow-up arteriography was performed. The micro  catheter and C2 were then withdrawn. After confirmatory femoral arteriogram, the sheath was removed and hemostasis achieved with the aid of Exoseal device. The patient tolerated the procedure well. COMPLICATIONS: None immediate FINDINGS: Selective splenic and celiac arteriography demonstrated no active extravasation, pseudoaneurysm, or other unexpected arterial finding. 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 Selective superior mesenteric arteriography demonstrated no active extravasation, pseudoaneurysm, or other vascular abnormality. The proper hepatic artery originates from the gastroduodenal artery, an anatomic variant. Venous phase confirms patency of superior mesenteric vein and portal vein with antegrade flow. Inferior mesenteric arteriography demonstrates several extraluminal foci of active extravasation in the left retroperitoneum, corresponding to findings on recent CT.  Subselective micro catheter arteriography localized the segmental supply to an area of active extravasation at the level of the proximal descending colon. This area was coil embolized, and follow-up arteriography demonstrated no further active extravasation. Sub selective micro catheter arteriography at the level of the sigmoid colon failed to localize the branches supplying the smaller site of active extravasation. Subselective micro catheter arteriography at the level of the distal descending colon localized a site of active extravasation, and once the supplying vessels were identified, septal sub selective coil Embolization was performed. Follow-up arteriography demonstrated no further extravasation in this region. IMPRESSION: 1. At least 3 separate foci of active extravasation in the left lower retroperitoneum from small branches of the inferior mesenteric artery circulation. 2. Technically successful coil embolization of 2 sites of active extravasation. Sub selective supply to the third site at the level of the sigmoid colon could not be localized. Critical Value/emergent results were discussed with Dr. [facility]ynne Edouard from surgery, who verbally acknowledged these results. 3. Splenic vein occlusion, as previously documented. Electronically Signed   By: Corlis Leak M.D.   On: 02/10/2015 16:23   Ir Angiogram Selective Each Additional Vessel  02/10/2015  CLINICAL DATA:  Acute pancreatitis with retroperitoneal fluid collections, splenic vein thrombosis, on anticoagulation. Recent CT demonstrates new left retroperitoneal hematoma with multifocal regions of active extravasation. Significant blood loss requiring transfusion. EXAM: SELECTIVE VISCERAL ARTERIOGRAPHY; IR EMBO ART VEN HEMORR LYMPH EXTRAV INC GUIDE ROADMAPPING; ADDITIONAL ARTERIOGRAPHY; IR ULTRASOUND GUIDANCE VASC ACCESS RIGHT MEDICATIONS: Intravenous Fentanyl and Versed were administered as conscious sedation during continuous cardiorespiratory monitoring by the  radiology RN, with a total moderate sedation time of 85 minutes. CONTRAST:  OMNIPAQUE IOHEXOL 300 MG/ML  SOLN FLUOROSCOPY TIME:  3.5 minutes, 48 51  uGym2 DAP PROCEDURE: The procedure, risks (including but not limited to bleeding, infection, organ damage ), benefits, and alternatives were explained to the patient. Questions regarding the procedure were encouraged and answered. The patient understands and consents to the procedure. Site was marked, prepped with Betadine, draped in usual sterile fashion, infiltrated locally with 1% lidocaine. Under ultrasound, right common femoral artery localized. Under real-time ultrasound guidance, the vessel was accessed with a 21-gauge micropuncture needle, exchanged over a 018 guidewire for a transitional dilator, through which a 035 guidewire was advanced. A 5 French vascular sheath was placed. A 5 French C2 catheter advanced and used to selectively catheterize the splenic artery for selective arteriography. Catheter was then pulled back into the celiac axis for additional selective arteriography. Catheter then used to selectively catheterize the superior mesenteric artery for selective arteriography in multiple projections. The catheter was exchanged over the guidewire for a RIM catheter for additional SMA arteriography. The RIM was exchanged back for the C2, used to selectively catheterize the inferior mesenteric artery for selective arteriography in multiple  projections. A renegade micro catheter was then coaxially advanced with the aid of a Fathom guidewire and used to selectively catheterize 3 different third order branches of the IMA for additional supra selective arteriography. Active extravasation at the level of the proximal descending colon was treated with 2 mm coil embolization. Follow-up arteriography was performed. The micro catheter was then redirected and coil embolization of a second site of active extravasation at the level of the distal descending colon  was performed. Follow-up arteriography was performed. The micro catheter and C2 were then withdrawn. After confirmatory femoral arteriogram, the sheath was removed and hemostasis achieved with the aid of Exoseal device. The patient tolerated the procedure well. COMPLICATIONS: None immediate FINDINGS: Selective splenic and celiac arteriography demonstrated no active extravasation, pseudoaneurysm, or other unexpected arterial finding. 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 Selective superior mesenteric arteriography demonstrated no active extravasation, pseudoaneurysm, or other vascular abnormality. The proper hepatic artery originates from the gastroduodenal artery, an anatomic variant. Venous phase confirms patency of superior mesenteric vein and portal vein with antegrade flow. Inferior mesenteric arteriography demonstrates several extraluminal foci of active extravasation in the left retroperitoneum, corresponding to findings on recent CT. Subselective micro catheter arteriography localized the segmental supply to an area of active extravasation at the level of the proximal descending colon. This area was coil embolized, and follow-up arteriography demonstrated no further active extravasation. Sub selective micro catheter arteriography at the level of the sigmoid colon failed to localize the branches supplying the smaller site of active extravasation. Subselective micro catheter arteriography at the level of the distal descending colon localized a site of active extravasation, and once the supplying vessels were identified, septal sub selective coil Embolization was performed. Follow-up arteriography demonstrated no further extravasation in this region. IMPRESSION: 1. At least 3 separate foci of active extravasation in the left lower retroperitoneum from small branches of the inferior mesenteric artery circulation. 2. Technically successful coil  embolization of 2 sites of active extravasation. Sub selective supply to the third site at the level of the sigmoid colon could not be localized. Critical Value/emergent results were discussed with Dr. Carolynne Edouard from surgery, who verbally acknowledged these results. 3. Splenic vein occlusion, as previously documented. Electronically Signed   By: Corlis Leak M.D.   On: 02/10/2015 16:23   Ir Angiogram Selective Each Additional Vessel  02/10/2015  CLINICAL DATA:  Acute pancreatitis with retroperitoneal fluid collections, splenic vein thrombosis, on anticoagulation. Recent CT demonstrates new left retroperitoneal hematoma with multifocal regions of active extravasation. Significant blood loss requiring transfusion. EXAM: SELECTIVE VISCERAL ARTERIOGRAPHY; IR EMBO ART VEN HEMORR LYMPH EXTRAV INC GUIDE ROADMAPPING; ADDITIONAL ARTERIOGRAPHY; IR ULTRASOUND GUIDANCE VASC ACCESS RIGHT MEDICATIONS: Intravenous Fentanyl and Versed were administered as conscious sedation during continuous cardiorespiratory monitoring by the radiology RN, with a total moderate sedation time of 85 minutes. CONTRAST:  OMNIPAQUE IOHEXOL 300 MG/ML  SOLN FLUOROSCOPY TIME:  3.5 minutes, 48 51  uGym2 DAP PROCEDURE: The procedure, risks (including but not limited to bleeding, infection, organ damage ), benefits, and alternatives were explained to the patient. Questions regarding the procedure were encouraged and answered. The patient understands and consents to the procedure. Site was marked, prepped with Betadine, draped in usual sterile fashion, infiltrated locally with 1% lidocaine. Under ultrasound, right common femoral artery localized. Under real-time ultrasound guidance, the vessel was accessed with a 21-gauge micropuncture needle, exchanged over a 018 guidewire for a transitional dilator, through which a 035 guidewire was advanced. A 5  [place] vascular sheath was placed. A 5 French C2 catheter advanced and used to selectively catheterize the  splenic artery for selective arteriography. Catheter was then pulled back into the celiac axis for additional selective arteriography. Catheter then used to selectively catheterize the superior mesenteric artery for selective arteriography in multiple projections. The catheter was exchanged over the guidewire for a RIM catheter for additional SMA arteriography. The RIM was exchanged back for the C2, used to selectively catheterize the inferior mesenteric artery for selective arteriography in multiple projections. A renegade micro catheter was then coaxially advanced with the aid of a Fathom guidewire and used to selectively catheterize 3 different third order branches of the IMA for additional supra selective arteriography. Active extravasation at the level of the proximal descending colon was treated with 2 mm coil embolization. Follow-up arteriography was performed. The micro catheter was then redirected and coil embolization of a second site of active extravasation at the level of the distal descending colon was performed. Follow-up arteriography was performed. The micro catheter and C2 were then withdrawn. After confirmatory femoral arteriogram, the sheath was removed and hemostasis achieved with the aid of Exoseal device. The patient tolerated the procedure well. COMPLICATIONS: None immediate FINDINGS: Selective splenic and celiac arteriography demonstrated no active extravasation, pseudoaneurysm, or other unexpected arterial finding. 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 Selective superior mesenteric arteriography demonstrated no active extravasation, pseudoaneurysm, or other vascular abnormality. The proper hepatic artery originates from the gastroduodenal artery, an anatomic variant. Venous phase confirms patency of superior mesenteric vein and portal vein with antegrade flow. Inferior mesenteric arteriography demonstrates several  extraluminal foci of active extravasation in the left retroperitoneum, corresponding to findings on recent CT. Subselective micro catheter arteriography localized the segmental supply to an area of active extravasation at the level of the proximal descending colon. This area was coil embolized, and follow-up arteriography demonstrated no further active extravasation. Sub selective micro catheter arteriography at the level of the sigmoid colon failed to localize the branches supplying the smaller site of active extravasation. Subselective micro catheter arteriography at the level of the distal descending colon localized a site of active extravasation, and once the supplying vessels were identified, septal sub selective coil Embolization was performed. Follow-up arteriography demonstrated no further extravasation in this region. IMPRESSION: 1. At least 3 separate foci of active extravasation in the left lower retroperitoneum from small branches of the inferior mesenteric artery circulation. 2. Technically successful coil embolization of 2 sites of active extravasation. Sub selective supply to the third site at the level of the sigmoid colon could not be localized. Critical Value/emergent results were discussed with Dr. Carolynne Edouard from surgery, who verbally acknowledged these results. 3. Splenic vein occlusion, as previously documented. Electronically Signed   By: Corlis Leak M.D.   On: 02/10/2015 16:23   Ir US Guide Vasc Access Right  02/10/2015  CLINICAL DATA:  Acute pancreatitis with retroperitoneal fluid collections, splenic vein thrombosis, on anticoagulation. Recent CT demonstrates new left retroperitoneal hematoma with multifocal regions of active extravasation. Significant blood loss requiring transfusion. EXAM: SELECTIVE VISCERAL ARTERIOGRAPHY; IR EMBO ART VEN HEMORR LYMPH EXTRAV INC GUIDE ROADMAPPING; ADDITIONAL ARTERIOGRAPHY; IR ULTRASOUND GUIDANCE VASC ACCESS RIGHT MEDICATIONS: Intravenous Fentanyl and Versed  were administered as conscious sedation during continuous cardiorespiratory monitoring by the radiology RN, with a total moderate sedation time of 85 minutes. CONTRAST:  OMNIPAQUE IOHEXOL 300 MG/ML  SOLN FLUOROSCOPY TIME:  3.5 minutes, 48 51  uGym2 DAP PROCEDURE: The procedure, risks (  including but not limited to bleeding, infection, organ damage ), benefits, and alternatives were explained to the patient. Questions regarding the procedure were encouraged and answered. The patient understands and consents to the procedure. Site was marked, prepped with Betadine, draped in usual sterile fashion, infiltrated locally with 1% lidocaine. Under ultrasound, right common femoral artery localized. Under real-time ultrasound guidance, the vessel was accessed with a 21-gauge micropuncture needle, exchanged over a 018 guidewire for a transitional dilator, through which a 035 guidewire was advanced. A 5 French vascular sheath was placed. A 5 French C2 catheter advanced and used to selectively catheterize the splenic artery for selective arteriography. Catheter was then pulled back into the celiac axis for additional selective arteriography. Catheter then used to selectively catheterize the superior mesenteric artery for selective arteriography in multiple projections. The catheter was exchanged over the guidewire for a RIM catheter for additional SMA arteriography. The RIM was exchanged back for the C2, used to selectively catheterize the inferior mesenteric artery for selective arteriography in multiple projections. A renegade micro catheter was then coaxially advanced with the aid of a Fathom guidewire and used to selectively catheterize 3 different third order branches of the IMA for additional supra selective arteriography. Active extravasation at the level of the proximal descending colon was treated with 2 mm coil embolization. Follow-up arteriography was performed. The micro catheter was then redirected and coil  embolization of a second site of active extravasation at the level of the distal descending colon was performed. Follow-up arteriography was performed. The micro catheter and C2 were then withdrawn. After confirmatory femoral arteriogram, the sheath was removed and hemostasis achieved with the aid of Exoseal device. The patient tolerated the procedure well. COMPLICATIONS: None immediate FINDINGS: Selective splenic and celiac arteriography demonstrated no active extravasation, pseudoaneurysm, or other unexpected arterial finding. 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 Selective superior mesenteric arteriography demonstrated no active extravasation, pseudoaneurysm, or other vascular abnormality. The proper hepatic artery originates from the gastroduodenal artery, an anatomic variant. Venous phase confirms patency of superior mesenteric vein and portal vein with antegrade flow. Inferior mesenteric arteriography demonstrates several extraluminal foci of active extravasation in the left retroperitoneum, corresponding to findings on recent CT. Subselective micro catheter arteriography localized the segmental supply to an area of active extravasation at the level of the proximal descending colon. This area was coil embolized, and follow-up arteriography demonstrated no further active extravasation. Sub selective micro catheter arteriography at the level of the sigmoid colon failed to localize the branches supplying the smaller site of active extravasation. Subselective micro catheter arteriography at the level of the distal descending colon localized a site of active extravasation, and once the supplying vessels were identified, septal sub selective coil Embolization was performed. Follow-up arteriography demonstrated no further extravasation in this region. IMPRESSION: 1. At least 3 separate foci of active extravasation in the left lower retroperitoneum  from small branches of the inferior mesenteric artery circulation. 2. Technically successful coil embolization of 2 sites of active extravasation. Sub selective supply to the third site at the level of the sigmoid colon could not be localized. Critical Value/emergent results were discussed with Dr. Carolynne Edouard from surgery, who verbally acknowledged these results. 3. Splenic vein occlusion, as previously documented. Electronically Signed   By: Corlis Leak M.D.   On: 02/10/2015 16:23   Ir Embo Art  [facility] [employer] Guide Roadmapping  02/10/2015  CLINICAL DATA:  Acute pancreatitis with retroperitoneal fluid collections, splenic vein  thrombosis, on anticoagulation. Recent CT demonstrates new left retroperitoneal hematoma with multifocal regions of active extravasation. Significant blood loss requiring transfusion. EXAM: SELECTIVE VISCERAL ARTERIOGRAPHY; IR EMBO ART VEN HEMORR LYMPH EXTRAV INC GUIDE ROADMAPPING; ADDITIONAL ARTERIOGRAPHY; IR ULTRASOUND GUIDANCE VASC ACCESS RIGHT MEDICATIONS: Intravenous Fentanyl and Versed were administered as conscious sedation during continuous cardiorespiratory monitoring by the radiology RN, with a total moderate sedation time of 85 minutes. CONTRAST:  OMNIPAQUE IOHEXOL 300 MG/ML  SOLN FLUOROSCOPY TIME:  3.5 minutes, 48 51  uGym2 DAP PROCEDURE: The procedure, risks (including but not limited to bleeding, infection, organ damage ), benefits, and alternatives were explained to the patient. Questions regarding the procedure were encouraged and answered. The patient understands and consents to the procedure. Site was marked, prepped with Betadine, draped in usual sterile fashion, infiltrated locally with 1% lidocaine. Under ultrasound, right common femoral artery localized. Under real-time ultrasound guidance, the vessel was accessed with a 21-gauge micropuncture needle, exchanged over a 018 guidewire for a transitional dilator, through which a 035 guidewire was advanced. A  5 French vascular sheath was placed. A 5 French C2 catheter advanced and used to selectively catheterize the splenic artery for selective arteriography. Catheter was then pulled back into the celiac axis for additional selective arteriography. Catheter then used to selectively catheterize the superior mesenteric artery for selective arteriography in multiple projections. The catheter was exchanged over the guidewire for a RIM catheter for additional SMA arteriography. The RIM was exchanged back for the C2, used to selectively catheterize the inferior mesenteric artery for selective arteriography in multiple projections. A renegade micro catheter was then coaxially advanced with the aid of a Fathom guidewire and used to selectively catheterize 3 different third order branches of the IMA for additional supra selective arteriography. Active extravasation at the level of the proximal descending colon was treated with 2 mm coil embolization. Follow-up arteriography was performed. The micro catheter was then redirected and coil embolization of a second site of active extravasation at the level of the distal descending colon was performed. Follow-up arteriography was performed. The micro catheter and C2 were then withdrawn. After confirmatory femoral arteriogram, the sheath was removed and hemostasis achieved with the aid of Exoseal device. The patient tolerated the procedure well. COMPLICATIONS: None immediate FINDINGS: Selective splenic and celiac arteriography demonstrated no active extravasation, pseudoaneurysm, or other unexpected arterial finding. 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 Selective superior mesenteric arteriography demonstrated no active extravasation, pseudoaneurysm, or other vascular abnormality. The proper hepatic artery originates from the gastroduodenal artery, an anatomic variant. Venous phase confirms patency of superior  mesenteric vein and portal vein with antegrade flow. Inferior mesenteric arteriography demonstrates several extraluminal foci of active extravasation in the left retroperitoneum, corresponding to findings on recent CT. Subselective micro catheter arteriography localized the segmental supply to an area of active extravasation at the level of the proximal descending colon. This area was coil embolized, and follow-up arteriography demonstrated no further active extravasation. Sub selective micro catheter arteriography at the level of the sigmoid colon failed to localize the branches supplying the smaller site of active extravasation. Subselective micro catheter arteriography at the level of the distal descending colon localized a site of active extravasation, and once the supplying vessels were identified, septal sub selective coil Embolization was performed. Follow-up arteriography demonstrated no further extravasation in this region. IMPRESSION: 1. At least 3 separate foci of active extravasation in the left lower retroperitoneum from small branches of the inferior mesenteric artery circulation.  2. Technically successful coil embolization of 2 sites of active extravasation. Sub selective supply to the third site at the level of the sigmoid colon could not be localized. Critical Value/emergent results were discussed with Dr. Carolynne Edouard from surgery, who verbally acknowledged these results. 3. Splenic vein occlusion, as previously documented. Electronically Signed   By: Corlis Leak M.D.   On: 02/10/2015 16:23    Medications: . anidulafungin  100 mg Intravenous Q24H  . antiseptic oral rinse  7 mL Mouth Rinse BID  . gabapentin  300 mg Oral BID WC  . gabapentin  600 mg Oral QHS  . imipenem-cilastatin  500 mg Intravenous 4 times per day  . loratadine  10 mg Oral Daily  . montelukast  10 mg Oral Daily  . multivitamin with minerals  1 tablet Oral Daily  . pantoprazole  40 mg Oral Daily  . potassium chloride  10 mEq  Intravenous Q1 Hr x 4    Assessment/Plan Alcoholic pancreatitis/splenic Vein thrombosis/pancreatic pseudocyst - on Coumadin/Lovenox bridge Intraabdominal bleed on Coumadin S/p Mesenteric angiography, IR emobolization of IMA branches x 2 02/10/15, Dr. Oley Balm Acute alcoholic pancreatitis Hypertension  Right thigh/leg pain Hypokalemia DVT: SCD, no anticoagulation secondary to bleed Antibiotics: Primaxin 10/13-10/20 (7 days) Cefuroxime tab + Flagyl tabs 10/21-10/23 (3 days), Now on Anidulafungin, Cefuroxime, and Primaxin day 3     Plan:  Continue Medical management of her bleed, H/H is slowly coming back down.  She has venous dopplers ordered, she has arterial flow to her foot, the site looks OK where she was stuck for the arteriogram.  I ask the nurse to give her an extra dose of dilaudid to help with pain and anxiety.       LOS: 3 days    Toshua Honsinger 02/12/2015

## 2015-02-12 NOTE — Progress Notes (Signed)
PULMONARY / CRITICAL CARE MEDICINE   Name: Kara [name] MRN: [id number] DOB: 01/30/1986    ADMISSION DATE:  02/09/2015 CONSULTATION DATE:  02/10/15  REFERRING MD :  Dr. 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  CTA of abdomen demonstrated concern for new intra-abdominal hematoma, inflammation and evolving pseudocysts.  Developed hypotension and PCCM consulted for evaluation of hypotension.    STUDIES:  10/23  CTA ABD/Pelvis >> interval development of a large heterogenous 13.7x10x7.4 cm fluid collection in L mid abdomen/LLQ tracking inferiorly from teh pancreatitic tail anterior to the left kidney & L psoas muscle concerning for intra-abdominal hematoma, multiple small areas of active bleeding.  Additional new retroperitoneal dense soft tissue inflammation at the level of the aortic bifurcation 7.5x5.2x2.9 cm concerning for extension of soft tissue inflammation into the inferior retroperitoneum.  New evolving pseudocysts in pancreatic tail up to 5.2 cm in size.  Evolving pseudocyst at the fundus of the stomach with complex fluid tracking about the spleen, complex fluid in the pelvis, small L pleural effusion, diffuse fatty infiltration of the liver  10/26  Venous Duplex >>   SIGNIFICANT EVENTS: 10/12 - 10/23  Admit for alcoholic pancreatitis, complicated by splenic vein thrombosis 10/23  Readmit for abd pain with intrabd hematoma. 10/24  IR embolization of IMA branches X 2. 10/26  Acute R thigh pain, pulses intact, pink/warm, sensation intact   SUBJECTIVE: Pt reports acute onset R thigh pain, tearful, anxious.  Reports pain as sharp shooting pain.  Continues to have pain in abdomen but improved from admit.    VITAL SIGNS: Temp:  [99.1 F (37.3 C)-102.5 F  (39.2 C)] 99.1 F (37.3 C) (10/26 0800) Pulse Rate:  [52-136] 113 (10/26 0600) Resp:  [18-40] 28 (10/26 0800) BP: (97-150)/(56-94) 114/71 mmHg (10/26 0800) SpO2:  [91 %-100 %] 98 % (10/26 0800) Weight:  [253 lb 15.5 oz (115.2 kg)] 253 lb 15.5 oz (115.2 kg) (10/26 0358)   HEMODYNAMICS: CVP:  [7 mmHg-18 mmHg] 13 mmHg   VENTILATOR SETTINGS:     INTAKE / OUTPUT:  Intake/Output Summary (Last 24 hours) at 02/12/15 0827 Last data filed at 02/12/15 0559  Gross per 24 hour  Intake   3135 ml  Output    632 ml  Net   2503 ml    PHYSICAL EXAMINATION: General:  Young adult female in NAD.  Neuro:  No focal deficits, AAOx4, speech clear, MAE HEENT:  Moist mucus membranes, no JVD or lymphadenopathy Cardiovascular:  S1, S2, No MRG Lungs:  Clear, No wheeze or crackles. Abdomen:  Obese/soft, tender to palpation, no bruising noted Musculoskeletal:  No acute deformities  Skin: 2+ pitting edema in BLE.  LE's pink/warm, intact sensation, movement, pulses intact with doppler. Groin site from IR clean, no hematoma or bruising.    LABS:  CBC  Recent Labs Lab 02/10/15 0157  02/11/15 0509 02/11/15 1400 02/12/15 0348 02/12/15 0550  WBC 17.0*  --  21.2*  --  22.0*  --   HGB 11.2*  < > 8.7* 8.2* 7.9* 7.9*  HCT 34.1*  < > 26.2* 25.0* 24.1* 24.0*  PLT 403*  --  303  --  325  --   < > = values in this interval not displayed.   Coag's  Recent Labs Lab 02/11/15 0126 02/11/15 0509 02/12/15  0348  INR 1.22 1.20 1.31   BMET  Recent Labs Lab 02/10/15 0157 02/11/15 0509 02/12/15 0348  NA 135 138 135  K 4.2 3.7 2.9*  CL 102 105 106  CO2 BUN <5* 8 8  CREATININE 0.56 0.65 0.52  GLUCOSE 142* 123* 131*   Electrolytes  Recent Labs Lab 02/10/15 0157 02/11/15 0509 02/12/15 0348  CALCIUM 8.2* 8.2* 7.8*  MG  --   --  1.4*  PHOS  --   --  2.0*    Sepsis Markers  Recent Labs Lab 02/11/15 0955 02/12/15 0348  PROCALCITON 64.72 47.33    ABG No results for input(s):  PHART, PCO2ART, PO2ART in the last 168 hours.   Liver Enzymes  Recent Labs Lab 02/09/15 0512 02/09/15 2040 02/11/15 0509  AST 53* 50* 64*  ALT ALKPHOS 198* 200* 128*  BILITOT 1.1 1.6* 2.0*  ALBUMIN 2.4* 2.5* 2.7*   Cardiac Enzymes No results for input(s): TROPONINI, PROBNP in the last 168 hours.   Glucose No results for input(s): GLUCAP in the last 168 hours.  Imaging No new imaging today.   ASSESSMENT / PLAN:  PULMONARY OETT A: At Risk Respiratory Failure - in the setting of intraabdominal hematoma / compromised respiratory mechanics Small Left Pleural Effusion  P:   Pulmonary hygiene: IS Oxygen if needed to support sats > 92%  CARDIOVASCULAR CVL L IJ 10/24 >>  A:  Hypovolemic Shock - responding to volume resuscitation, in setting of intraabdominal hematoma  Tachycardia - secondary to above  Poor Venous Access - unable to stick for labs  LE Edema - in setting of poor intake / low albumin, recent critical illness  Hx HTN P:  Reduce NS to 50 ml/hr Trend CVP Q4  Volume resuscitation with PRBC's if needed   RENAL A:   At Risk AKI - in setting of volume depletion / hematoma, contrast for ABD CT Hypokalemia  Hypomagnesemia  Hypophosphatemia  P:   Saline as above  Monitor BMP / UOP  Replace electrolytes as indicated - mg, K, phos 10/26 Low dose lasix x1   GASTROINTESTINAL A:   Intraabdominal Hematoma - on lovenox / coumadin. S/p IR embolization of IMA branches X 2 Resolving Alcoholic Pancreatitis  Splenic Vein Thrombosis - d/c'd on lovenox / coumadin Abdominal Pain  Hypoalbuminemia - in setting of poor intake / recent illness  P:   Difficult to control abdominal pain due to hypotension  CCS following, appreciate input. 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  No family available at present.     Canary Brim, NP-C [facility] Pgr: [phone] or if no answer [phone] 02/12/2015, 8:37 AM

## 2015-02-12 NOTE — Progress Notes (Signed)
eLink Physician-Brief Progress Note Patient Name: Kara [name] DOB: 07/07/1985 MRN: [id number]   Date of Service  02/12/2015  HPI/Events of Note  Acute onset groin pain hgb down a bit Per RN on exam the leg is warm, all pulses in leg are intact (DP, Popliteal), groin access site from yesterday is clean and dry without bruising or bleeding Also hypokalemic  eICU Interventions  Replace K Stat ultrasound R groin (vascular) Frequent pulse monitoring of leg RN to notify if exam changes Extra dose of dilaudid now Stat repeat H/H May need CT to look for RP bleed, follow H/H     Intervention Category Intermediate Interventions: Electrolyte abnormality - evaluation and management;Pain - evaluation and management  Max FickleDouglas Jovante Hammitt 02/12/2015, 5:28 AM

## 2015-02-12 NOTE — Progress Notes (Signed)
Patient had c/o of severe right upper thigh cramps and pain. She rates pain 10/10. Patient pedal pulse +1. Extremity is warm and is consistent with patient's ethnicity. Dressing to right groin is clean, dry, and intact. Called and notified E-link physician. New orders written. Multiple attempts were made to call interventional radiology. E-link MD was called and updated of efforts.

## 2015-02-12 NOTE — Progress Notes (Signed)
*  Preliminary Results* Right lower extremity venous duplex completed. Right lower extremity is negative for deep vein thrombosis. There is no evidence of right Baker's cyst.  02/12/2015 9:57 AM  Gertie FeyMichelle Chaos Carlile, RVT, RDCS, RDMS

## 2015-02-13 LAB — BASIC METABOLIC PANEL
Anion gap: 5 (ref 5–15)
BUN: 6 mg/dL (ref 6–20)
CHLORIDE: 103 mmol/L (ref 101–111)
CO2: 24 mmol/L (ref 22–32)
CREATININE: 0.45 mg/dL (ref 0.44–1.00)
Calcium: 7.8 mg/dL — ABNORMAL LOW (ref 8.9–10.3)
GFR calc Af Amer: >60 mL/min (ref >60)
GFR calc non Af Amer: >60 mL/min (ref >60)
GLUCOSE: 101 mg/dL — AB (ref 65–99)
POTASSIUM: 3 mmol/L — AB (ref 3.5–5.1)
Sodium: 132 mmol/L — ABNORMAL LOW (ref 135–145)

## 2015-02-13 LAB — CBC
HEMATOCRIT: 23.3 % — AB (ref 36.0–46.0)
HEMOGLOBIN: 7.6 g/dL — AB (ref 12.0–15.0)
MCH: 31.1 pg (ref 26.0–34.0)
MCHC: 32.6 g/dL (ref 30.0–36.0)
MCV: 95.5 fL (ref 78.0–100.0)
Platelets: 342 10*3/uL (ref 150–400)
RBC: 2.44 MIL/uL — AB (ref 3.87–5.11)
RDW: 15.8 % — ABNORMAL HIGH (ref 11.5–15.5)
WBC: 16.7 10*3/uL — ABNORMAL HIGH (ref 4.0–10.5)

## 2015-02-13 LAB — PROCALCITONIN: Procalcitonin: 21.12 ng/mL

## 2015-02-13 LAB — PROTIME-INR
INR: 1.23 (ref 0.00–1.49)
Prothrombin Time: 15.7 seconds — ABNORMAL HIGH (ref 11.6–15.2)

## 2015-02-13 LAB — LIPASE, BLOOD: Lipase: 20 U/L (ref 11–51)

## 2015-02-13 MED ORDER — POTASSIUM CHLORIDE 10 MEQ/50ML IV SOLN
10.0000 meq | INTRAVENOUS | Status: AC
  Administered 2015-02-13 (×6): 10 meq via INTRAVENOUS
  Filled 2015-02-13 (×7): qty 50

## 2015-02-13 NOTE — Progress Notes (Signed)
ANTIBIOTIC CONSULT NOTE - Follow up  Pharmacy Consult for Primaxin Indication: Sepsis from [facility]  Allergies  Allergen Reactions  . Peanuts [Peanut Oil] Itching    Sensitivity     Patient Measurements: Height:  (165.1 cm) Weight: 260 lb 2.3 oz (118 kg) IBW/kg (Calculated) : 57  Vital Signs: Temp: 99.4 F (37.4 C) (10/27 1308) Temp Source: Oral (10/27 1308) BP: 135/84 mmHg (10/27 1308) Pulse Rate: 115 (10/27 1308) Intake/Output from previous day: 10/26 0701 - 10/27 0700 In: 2880 [I.V.:2250; IV Piggyback:630] Out: 3200 [Urine:3200] Intake/Output from this shift: Total I/O In: 800 [P.O.:150; I.V.:250; IV Piggyback:400] Out: 525 [Urine:525]  Labs:  Recent Labs  02/11/15 0509  02/12/15 0348 02/12/15 0550 02/13/15 0420  WBC 21.2*  --  22.0*  --  16.7*  HGB 8.7*  < > 7.9* 7.9* 7.6*  PLT 303  --  325  --  342  CREATININE 0.65  --  0.52  --  0.45  < > = values in this interval not displayed. Estimated Creatinine Clearance: 132.1 mL/min (by C-G formula based on Cr of 0.45). No results for input(s): VANCOTROUGH, VANCOPEAK, VANCORANDOM, GENTTROUGH, GENTPEAK, GENTRANDOM, TOBRATROUGH, TOBRAPEAK, TOBRARND, AMIKACINPEAK, AMIKACINTROU, AMIKACIN in the last 72 hours.   Microbiology: Recent Results (from the past 720 hour(s))  Culture, blood (routine x 2)     Status: None   Collection Time: 01/31/15 12:17 PM  Result Value Ref Range Status   Specimen Description BLOOD RIGHT ANTECUBITAL  Final   Special Requests BOTTLES DRAWN AEROBIC AND ANAEROBIC 10CC  Final   Culture   Final    NO GROWTH 5 DAYS Performed at [facility]    Report Status 02/05/2015 FINAL  Final  Culture, blood (routine x 2)     Status: None   Collection Time: 01/31/15 12:17 PM  Result Value Ref Range Status   Specimen Description BLOOD RIGHT HAND  Final   Special Requests BOTTLES DRAWN AEROBIC AND ANAEROBIC 5CC  Final   Culture   Final    NO GROWTH 5 DAYS Performed at [facility]     Report Status 02/05/2015 FINAL  Final  Culture, Urine     Status: None   Collection Time: 02/03/15  3:47 AM  Result Value Ref Range Status   Specimen Description URINE, CLEAN CATCH  Final   Special Requests NONE  Final   Culture   Final    NO GROWTH 1 DAY Performed at [facility]    Report Status 02/04/2015 FINAL  Final  Wet prep, genital     Status: None   Collection Time: 02/09/15  9:13 PM  Result Value Ref Range Status   Yeast Wet Prep HPF POC NONE SEEN NONE SEEN Final   Trich, Wet Prep NONE SEEN NONE SEEN Final   Clue Cells Wet Prep HPF POC NONE SEEN NONE SEEN Final   WBC, Wet Prep HPF POC NONE SEEN NONE SEEN Final  MRSA PCR Screening     Status: None   Collection Time: 02/10/15  1:04 AM  Result Value Ref Range Status   MRSA by PCR NEGATIVE NEGATIVE Final    Comment:        The GeneXpert MRSA Assay (FDA approved for NASAL specimens only), is one component of a comprehensive MRSA colonization surveillance program. It is not intended to diagnose MRSA infection nor to guide or monitor treatment for MRSA infections.    Assessment: 29 Y/O with recent hospitalization for alcohol pancreatitis, splenic vein thrombosis. Discharged  on anticoagulation and Cefuroxime/Flagyl; now admitted with abdominal pain and hypotension. Found to have multiple fluid collections in abd that are likely a combination of pancreatic pseudocysts (w/out necrosis) and intraabdominal hemorrhage.  Pharmacy to begin Primaxin for intra-abdominal infection.  10/23 Cefuroxime/Flagyl >> 10/24 10/24 >> Primaxin >> 10/24 >> Eraxis (MD) >>    No Cx obtained  Today, 02/13/2015: Tm 99.8, improved  WBC elevated but improving now Renal: SCr stable wnl; CrCl > 90 CG/N  Goal of Therapy:  Vancomycin trough level 15-20 mcg/ml  Eradication of infection Appropriate antibiotic dosing for indication and renal function  Plan:  Day 5 antibiotics(Day 4 Primaxin + Eraxis) Cont Primaxin 500mg   q6h. Anidulafungin 100mg  q24h per MD - appropriate for renal function and indication  Follow clinical course, renal function, culture results as available  Follow for de-escalation of antibiotics and LOT  Charolotte Ekeom Codi Kertz, PharmD, pager [phone]. 02/13/2015,1:17 PM.

## 2015-02-13 NOTE — Progress Notes (Signed)
PULMONARY / CRITICAL CARE MEDICINE   Name: Kara [name] MRN: [id number] DOB: 04-Feb-1986    ADMISSION DATE:  02/09/2015 CONSULTATION DATE:  02/10/15  REFERRING MD :  Dr. 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  CTA of abdomen demonstrated concern for new intra-abdominal hematoma, inflammation and evolving pseudocysts.  Developed hypotension and PCCM consulted for evaluation of hypotension.    STUDIES:  10/23  CTA ABD/Pelvis >> interval development of a large heterogenous 13.7x10x7.4 cm fluid collection in L mid abdomen/LLQ tracking inferiorly from teh pancreatitic tail anterior to the left kidney & L psoas muscle concerning for intra-abdominal hematoma, multiple small areas of active bleeding.  Additional new retroperitoneal dense soft tissue inflammation at the level of the aortic bifurcation 7.5x5.2x2.9 cm concerning for extension of soft tissue inflammation into the inferior retroperitoneum.  New evolving pseudocysts in pancreatic tail up to 5.2 cm in size.  Evolving pseudocyst at the fundus of the stomach with complex fluid tracking about the spleen, complex fluid in the pelvis, small L pleural effusion, diffuse fatty infiltration of the liver  10/26  Venous Duplex >>   SIGNIFICANT EVENTS: 10/12 - 10/23  Admit for alcoholic pancreatitis, complicated by splenic vein thrombosis 10/23  Readmit for abd pain with intrabd hematoma. 10/24  IR embolization of IMA branches X 2. 10/26  Acute R thigh pain, pulses intact, pink/warm, sensation intact   SUBJECTIVE: Feels better today.    VITAL SIGNS: Temp:  [99.1 F (37.3 C)-101 F (38.3 C)] 99.1 F (37.3 C) (10/27 0400) Pulse Rate:  [100-154] 110 (10/27 0600) Resp:  [15-35] 16 (10/27 0600) BP: (112-147)/(64-87)  138/73 mmHg (10/27 0600) SpO2:  [86 %-98 %] 94 % (10/27 0600) Weight:  [260 lb 2.3 oz (118 kg)] 260 lb 2.3 oz (118 kg) (10/27 0500)   HEMODYNAMICS: CVP:  [11 mmHg] 11 mmHg   VENTILATOR SETTINGS:     INTAKE / OUTPUT:  Intake/Output Summary (Last 24 hours) at 02/13/15 0865 Last data filed at 02/13/15 0600  Gross per 24 hour  Intake   1730 ml  Output   3000 ml  Net  -1270 ml    PHYSICAL EXAMINATION: General:  Young adult female in NAD. Smiling and interactive Neuro:  No focal deficits, AAOx4, speech clear, MAE HEENT:  Moist mucus membranes, no JVD or lymphadenopathy Cardiovascular:  S1, S2, No MRG Lungs:  Clear, No wheeze or crackles. Abdomen:  Obese/soft, tender to palpation, no bruising noted Musculoskeletal:  No acute deformities  Skin: 2+ pitting edema in BLE.  LE's pink/warm, intact sensation, movement, pulses intact with doppler. Groin site from IR clean, no hematoma or bruising.    LABS:  CBC  Recent Labs Lab 02/11/15 0509  02/12/15 0348 02/12/15 0550 02/13/15 0420  WBC 21.2*  --  22.0*  --  16.7*  HGB 8.7*  < > 7.9* 7.9* 7.6*  HCT 26.2*  < > 24.1* 24.0* 23.3*  PLT 303  --  325  --  342  < > = values in this interval not displayed.   Coag's  Recent Labs Lab 02/11/15 0509 02/12/15 0348 02/13/15 0420  INR 1.20 1.31 1.23   BMET  Recent Labs Lab 02/11/15 0509 02/12/15 0348 02/13/15 0420  NA 138 135 132*  K 3.7 2.9* 3.0*  CL 105 106 103  CO2 BUN CREATININE 0.65 0.52 0.45  GLUCOSE 123* 131* 101*   Electrolytes  Recent Labs Lab 02/11/15 0509 02/12/15 0348 02/12/15 0430 02/13/15 0420  CALCIUM 8.2* 7.8*  --  7.8*  MG  --  1.4* 1.5*  --   PHOS  --  2.0*  --   --     Sepsis Markers  Recent Labs Lab 02/11/15 0955 02/12/15 0348 02/13/15 0420  PROCALCITON 64.72 47.33 21.12    ABG No results for input(s): PHART, PCO2ART, PO2ART in the last 168 hours.   Liver Enzymes  Recent Labs Lab 02/09/15 0512 02/09/15 2040  02/11/15 0509  AST 53* 50* 64*  ALT ALKPHOS 198* 200* 128*  BILITOT 1.1 1.6* 2.0*  ALBUMIN 2.4* 2.5* 2.7*   Cardiac Enzymes No results for input(s): TROPONINI, PROBNP in the last 168 hours.   Glucose No results for input(s): GLUCAP in the last 168 hours.  Imaging No new imaging today.   ASSESSMENT / PLAN:  PULMONARY OETT A: At Risk Respiratory Failure - in the setting of intraabdominal hematoma / compromised respiratory mechanics Small Left Pleural Effusion  P:   Pulmonary hygiene: IS Oxygen if needed to support sats > 92%, not needed  CARDIOVASCULAR CVL L IJ 10/24 >>  A:  Hypovolemic Shock - responding to volume resuscitation, in setting of intraabdominal hematoma (resolved) Tachycardia - secondary to above  Poor Venous Access - unable to stick for labs  LE Edema - in setting of poor intake / low albumin, recent critical illness  Hx HTN P:  Reduce NS to 50 ml/hr   RENAL Lab Results  Component Value Date   CREATININE 0.45 02/13/2015   CREATININE 0.52 02/12/2015   CREATININE 0.65 02/11/2015    A:   At Risk AKI - in setting of volume depletion / hematoma, contrast for ABD CT Hypokalemia  Hypomagnesemia  Hypophosphatemia  P:   Saline as above  Monitor BMP / UOP  Replace electrolytes as indicated - mg, K, phos 10/26   GASTROINTESTINAL A:   Intraabdominal Hematoma - on lovenox / coumadin. S/p IR embolization of IMA branches X 2 Resolving Alcoholic Pancreatitis  Splenic Vein Thrombosis - d/c'd on lovenox / coumadin Abdominal Pain  Hypoalbuminemia - in setting of poor intake / recent illness  P:   Difficult to control abdominal pain due to hypotension  CCS following, appreciate input. 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  No family available at present.    Global: Much improved 10/27. Will move to floor ans to triad service as of 10/28.    Brett CanalesSteve Lilah Mijangos ACNP [facility] Pager [phone] till 3 pm If no answer page [phone] 02/13/2015, 9:22 AM

## 2015-02-13 NOTE — Progress Notes (Signed)
  Subjective: Still very tender, but seems to be doing better, certainly less anxious even laughing this AM.  Objective: Vital signs in last 24 hours: Temp:  [99.1 F (37.3 C)-101 F (38.3 C)] 99.1 F (37.3 C) (10/27 0400) Pulse Rate:  [100-154] 110 (10/27 0600) Resp:  [15-35] 16 (10/27 0600) BP: (112-147)/(64-87) 138/73 mmHg (10/27 0600) SpO2:  [86 %-98 %] 94 % (10/27 0600) Weight:  [118 kg (260 lb 2.3 oz)] 118 kg (260 lb 2.3 oz) (10/27 0500) Last BM Date: 02/11/15 3200 urine BM x 2 TM 101, still tachycardic, BP stable K+ 3.0 WBC coming down, 16.7 H/H 7.6/23.3 1 gram drop over 48 hours INR 1.23 Intake/Output from previous day: 10/26 0701 - 10/27 0700 In: 2830 [I.V.:2200; IV Piggyback:630] Out: 3200 [Urine:3200] Intake/Output this shift:    General appearance: alert, cooperative and no distress GI: soft, but still very tender.  Moving and up to BR, tolerating clears.+BM  Lab Results:   Recent Labs  02/12/15 0348 02/12/15 0550 02/13/15 0420  WBC 22.0*  --  16.7*  HGB 7.9* 7.9* 7.6*  HCT 24.1* 24.0* 23.3*  PLT 325  --  342    BMET  Recent Labs  02/12/15 0348 02/13/15 0420  NA 135 132*  K 2.9* 3.0*  CL 106 103  CO2 23 24  GLUCOSE 131* 101*  BUN 8 6  CREATININE 0.52 0.45  CALCIUM 7.8* 7.8*   PT/INR  Recent Labs  02/12/15 0348 02/13/15 0420  LABPROT 16.4* 15.7*  INR 1.31 1.23     Recent Labs Lab 02/07/15 0540 02/08/15 0550 02/09/15 0512 02/09/15 2040 02/11/15 0509  AST 68* 74* 53* 50* 64*  ALT 33 31 26 25 22   ALKPHOS 215* 217* 198* 200* 128*  BILITOT 1.5* 1.3* 1.1 1.6* 2.0*  PROT 6.0* 5.8* 6.0* 6.3* 6.2*  ALBUMIN 2.3* 2.3* 2.4* 2.5* 2.7*     Lipase     Component Value Date/Time   LIPASE 20 02/13/2015 0420     Studies/Results: No results found.  Medications: . anidulafungin  100 mg Intravenous Q24H  . antiseptic oral rinse  7 mL Mouth Rinse BID  . gabapentin  300 mg Oral BID WC  . gabapentin  600 mg Oral QHS  .  imipenem-cilastatin  500 mg Intravenous 4 times per day  . loratadine  10 mg Oral Daily  . montelukast  10 mg Oral Daily  . multivitamin with minerals  1 tablet Oral Daily  . pantoprazole  40 mg Oral Daily  . potassium chloride  10 mEq Intravenous Q1 Hr x 6    Assessment/Plan Alcoholic pancreatitis/splenic Vein thrombosis/pancreatic pseudocyst - on Coumadin/Lovenox bridge Intraabdominal bleed on Coumadin S/p Mesenteric angiography, IR emobolization of IMA branches x 2 02/10/15, Dr. Oley Balmaniel Hassell Acute alcoholic pancreatitis Hypertension  Right thigh/leg pain Hypokalemia DVT: SCD, no anticoagulation secondary to bleed Antibiotics: Primaxin 10/13-10/20 (7 days) Cefuroxime tab + Flagyl tabs 10/21-10/23 (3 days), Now on Anidulafungin, Cefuroxime, and Primaxin day 4   Plan:  No acute surgical issues currently.  Will see again as needed. Please call any time..    LOS: 4 days    Kara [name] 02/13/2015

## 2015-02-13 NOTE — Progress Notes (Signed)
eLink Physician-Brief Progress Note Patient Name: Kara [name] DOB: 05/04/1985 MRN: [id number]   Date of Service  02/13/2015  HPI/Events of Note  Hypokalemia   eICU Interventions  6 runs KCl IV     Intervention Category Intermediate Interventions: Electrolyte abnormality - evaluation and management  Billy FischerDavid Dhruti Ghuman 02/13/2015, 5:49 AM

## 2015-02-13 NOTE — Progress Notes (Signed)
Date: February 13, 2015 Chart reviewed for concurrent status and case management needs. Will continue to follow patient for changes and needs: Braxton Weisbecker, RN, BSN, CCM   [phone] 

## 2015-02-14 LAB — BASIC METABOLIC PANEL
Anion gap: 6 (ref 5–15)
BUN: 5 mg/dL — AB (ref 6–20)
CHLORIDE: 104 mmol/L (ref 101–111)
CO2: 27 mmol/L (ref 22–32)
Calcium: 8.4 mg/dL — ABNORMAL LOW (ref 8.9–10.3)
Creatinine, Ser: 0.48 mg/dL (ref 0.44–1.00)
GFR calc Af Amer: >60 mL/min (ref >60)
GFR calc non Af Amer: >60 mL/min (ref >60)
Glucose, Bld: 110 mg/dL — ABNORMAL HIGH (ref 65–99)
POTASSIUM: 3.1 mmol/L — AB (ref 3.5–5.1)
SODIUM: 137 mmol/L (ref 135–145)

## 2015-02-14 LAB — CBC
HCT: 26.8 % — ABNORMAL LOW (ref 36.0–46.0)
HEMOGLOBIN: 8.5 g/dL — AB (ref 12.0–15.0)
MCH: 31.4 pg (ref 26.0–34.0)
MCHC: 31.7 g/dL (ref 30.0–36.0)
MCV: 98.9 fL (ref 78.0–100.0)
Platelets: 437 10*3/uL — ABNORMAL HIGH (ref 150–400)
RBC: 2.71 MIL/uL — AB (ref 3.87–5.11)
RDW: 15.8 % — ABNORMAL HIGH (ref 11.5–15.5)
WBC: 13.5 10*3/uL — ABNORMAL HIGH (ref 4.0–10.5)

## 2015-02-14 LAB — PHOSPHORUS: PHOSPHORUS: 3 mg/dL (ref 2.5–4.6)

## 2015-02-14 LAB — PROTIME-INR
INR: 1.13 (ref 0.00–1.49)
PROTHROMBIN TIME: 14.7 s (ref 11.6–15.2)

## 2015-02-14 LAB — MAGNESIUM: MAGNESIUM: 1.8 mg/dL (ref 1.7–2.4)

## 2015-02-14 MED ORDER — POTASSIUM CHLORIDE CRYS ER 20 MEQ PO TBCR
40.0000 meq | EXTENDED_RELEASE_TABLET | Freq: Once | ORAL | Status: AC
Start: 2015-02-14 — End: 2015-02-14
  Administered 2015-02-14: 40 meq via ORAL
  Filled 2015-02-14: qty 2

## 2015-02-14 NOTE — Progress Notes (Signed)
[facility] PROGRESS NOTE  Kara [name] WUJ:[id number] DOB: 03/16/1986 DOA: 02/09/2015 PCP:  Duane Lopeoss, Alan, MD  Assessment/Plan:  29 Y/O with recent hospitalization for alcohol pancreatitis, splenic vein thrombosis recently discharged on anticoagulation admitted for abdominal pain  Found to have multiple fluid collections, probably pseudocysts and intra abd hemorrhage. She underwent IR guided embolization of the IMA branches.   Acute respiratory failure; On [place] oxygen to keep sats greater than 90%   Hypovolemic shock from intraabdominal hemorrhage: Resolved with fluid resuscitation.  Decreased the fluid rate.   Acute renal failure: From volume depletion.     hypokalemia: replete as needed.  Repeat in a;m.    abd hematoma: IR guided embolization of the IMA branches.   Acute blood loss anemia :  from intra abd hemorrhage.  Monitor.   Code Status: full code.  Family Communication: noneat bedside Disposition Plan: pending PT eval   Consultants:  PCCM  SURGERY  Procedures:   IR guided embolization of the IMA branches.  Antibiotics:  none  HPI/Subjective: Feeling much better  Objective: Filed Vitals:   02/14/15 1513  BP: 132/81  Pulse: 122  Temp: 99.9 F (37.7 C)  Resp: 18    Intake/Output Summary (Last 24 hours) at 02/14/15 1854 Last data filed at 02/14/15 1842  Gross per 24 hour  Intake   2865 ml  Output   2325 ml  Net    540 ml   Filed Weights   02/12/15 0358 02/13/15 0500 02/14/15 0509  Weight: 115.2 kg (253 lb 15.5 oz) 118 kg (260 lb 2.3 oz) 119 kg (262 lb 5.6 oz)    Exam:   General:  Alert afebrile comfortable  Cardiovascular: s1s2  Respiratory: diminished at bases,  Abdomen: mildly tender gen.   Musculoskeletal: 3 + pedal edema. upt o the level of hips.   Data Reviewed: Basic Metabolic Panel:  Recent Labs Lab 02/10/15 0157 02/11/15 0509 02/12/15 0348 02/12/15 0430 02/13/15 0420 02/14/15 0427  NA 135 138 135  --   132* 137  K 4.2 3.7 2.9*  --  3.0* 3.1*  CL 102 105 106  --  103 104  CO2 22 24 23   --  24 27  GLUCOSE 142* 123* 131*  --  101* 110*  BUN <5* 8 8  --  6 5*  CREATININE 0.56 0.65 0.52  --  0.45 0.48  CALCIUM 8.2* 8.2* 7.8*  --  7.8* 8.4*  MG  --   --  1.4* 1.5*  --  1.8  PHOS  --   --  2.0*  --   --  3.0   Liver Function Tests:  Recent Labs Lab 02/08/15 0550 02/09/15 0512 02/09/15 2040 02/11/15 0509  AST 74* 53* 50* 64*  ALT 31 26 25 22   ALKPHOS 217* 198* 200* 128*  BILITOT 1.3* 1.1 1.6* 2.0*  PROT 5.8* 6.0* 6.3* 6.2*  ALBUMIN 2.3* 2.4* 2.5* 2.7*    Recent Labs Lab 02/09/15 2040 02/11/15 0509 02/12/15 0348 02/13/15 0420  LIPASE 43 25 21 20    No results for input(s): AMMONIA in the last 168 hours. CBC:  Recent Labs Lab 02/09/15 2040 02/10/15 0157  02/11/15 0509 02/11/15 1400 02/12/15 0348 02/12/15 0550 02/13/15 0420 02/14/15 0427  WBC 15.7* 17.0*  --  21.2*  --  22.0*  --  16.7* 13.5*  NEUTROABS 13.4*  --   --   --   --   --   --   --   --   HGB  11.7* 11.2*  < > 8.7* 8.2* 7.9* 7.9* 7.6* 8.5*  HCT 36.3 34.1*  < > 26.2* 25.0* 24.1* 24.0* 23.3* 26.8*  MCV 98.9 98.3  --  93.9  --  95.6  --  95.5 98.9  PLT 412* 403*  --  303  --  325  --  342 437*  < > = values in this interval not displayed. Cardiac Enzymes: No results for input(s): CKTOTAL, CKMB, CKMBINDEX, TROPONINI in the last 168 hours. BNP (last 3 results)  Recent Labs  07/06/14 1255  BNP 79.2    ProBNP (last 3 results) No results for input(s): PROBNP in the last 8760 hours.  CBG: No results for input(s): GLUCAP in the last 168 hours.  Recent Results (from the past 240 hour(s))  Wet prep, genital     Status: None   Collection Time: 02/09/15  9:13 PM  Result Value Ref Range Status   Yeast Wet Prep HPF POC NONE SEEN NONE SEEN Final   Trich, Wet Prep NONE SEEN NONE SEEN Final   Clue Cells Wet Prep HPF POC NONE SEEN NONE SEEN Final   WBC, Wet Prep HPF POC NONE SEEN NONE SEEN Final  MRSA PCR  Screening     Status: None   Collection Time: 02/10/15  1:04 AM  Result Value Ref Range Status   MRSA by PCR NEGATIVE NEGATIVE Final    Comment:        The GeneXpert MRSA Assay (FDA approved for NASAL specimens only), is one component of a comprehensive MRSA colonization surveillance program. It is not intended to diagnose MRSA infection nor to guide or monitor treatment for MRSA infections.      Studies: No results found.  Scheduled Meds: . anidulafungin  100 mg Intravenous Q24H  . antiseptic oral rinse  7 mL Mouth Rinse BID  . gabapentin  300 mg Oral BID WC  . gabapentin  600 mg Oral QHS  . imipenem-cilastatin  500 mg Intravenous 4 times per day  . loratadine  10 mg Oral Daily  . montelukast  10 mg Oral Daily  . multivitamin with minerals  1 tablet Oral Daily  . pantoprazole  40 mg Oral Daily   Continuous Infusions: . sodium chloride 50 mL/hr at 02/12/15 1100    Principal Problem:   Abdominal hematoma Active Problems:   Chronic diastolic heart failure (HCC)   Intractable abdominal pain   Edema   Leukocytosis   Splenic vein thrombosis   Intra-abdominal hematoma   Warfarin-induced coagulopathy (HCC)   Hypertension   Hypoalbuminemia due to protein-calorie malnutrition (HCC)   Alcoholic pancreatitis   Acute blood loss anemia    Time spent: 25 minutes.     [facility]  [facility] Pager [phone]  If 7PM-7AM, please contact night-coverage at [email or web address], password [facility] 02/14/2015, 6:54 PM  LOS: 5 days

## 2015-02-15 LAB — BASIC METABOLIC PANEL
Anion gap: 6 (ref 5–15)
CALCIUM: 8.5 mg/dL — AB (ref 8.9–10.3)
CO2: 26 mmol/L (ref 22–32)
CREATININE: 0.53 mg/dL (ref 0.44–1.00)
Chloride: 105 mmol/L (ref 101–111)
GFR calc Af Amer: >60 mL/min (ref >60)
GLUCOSE: 107 mg/dL — AB (ref 65–99)
POTASSIUM: 2.9 mmol/L — AB (ref 3.5–5.1)
Sodium: 137 mmol/L (ref 135–145)

## 2015-02-15 LAB — PROTIME-INR
INR: 1.22 (ref 0.00–1.49)
PROTHROMBIN TIME: 15.6 s — AB (ref 11.6–15.2)

## 2015-02-15 MED ORDER — OXYCODONE HCL 5 MG PO TABS
5.0000 mg | ORAL_TABLET | ORAL | Status: DC | PRN
  Administered 2015-02-15 – 2015-02-17 (×5): 5 mg via ORAL
  Filled 2015-02-15 (×7): qty 1

## 2015-02-15 NOTE — Progress Notes (Signed)
[facility] PROGRESS NOTE  Karma Lewmanda Dalby VWU:[id number]:[id number] DOB: 03/17/1986 DOA: 02/09/2015 PCP:  Duane Lopeoss, Alan, MD  Assessment/Plan:  29 Y/O with recent hospitalization for alcohol pancreatitis, splenic vein thrombosis recently discharged on anticoagulation admitted for abdominal pain  Found to have multiple fluid collections, probably pseudocysts and intra abd hemorrhage. She underwent IR guided embolization of the IMA branches.   Acute respiratory failure; On [place] oxygen to keep sats greater than 90%  Resolved.   Hypovolemic shock from intraabdominal hemorrhage: Resolved with fluid resuscitation.  Decreased the fluid rate and now stop the fluids as her bp is stable.   Acute renal failure: From volume depletion.  Resolved.     hypokalemia: replete as needed.  Repeat in a;m.  BMP is pending.    abd hematoma: IR guided embolization of the IMA branches.  Abdominal pain improved.   Acute blood loss anemia :  from intra abd hemorrhage.  Monitor.  Stable around 8.   Low grade temp: D/c foley catheter.  Decreasing leukocytosis.  Monitor.   Code Status: full code.  Family Communication: noneat bedside Disposition Plan: pending PT eval   Consultants:  PCCM  SURGERY  Procedures:   IR guided embolization of the IMA branches.  Antibiotics:  none  HPI/Subjective: Reports pain in her feet from fluid, having a hard time walking.  Increased teh oxycodone and discontinued the fluids.   Objective: Filed Vitals:   02/15/15 0559  BP: 144/80  Pulse: 119  Temp: 100.2 F (37.9 C)  Resp: 18    Intake/Output Summary (Last 24 hours) at 02/15/15 1106 Last data filed at 02/15/15 0921  Gross per 24 hour  Intake 2963.33 ml  Output   2800 ml  Net 163.33 ml   Filed Weights   02/13/15 0500 02/14/15 0509 02/15/15 0543  Weight: 118 kg (260 lb 2.3 oz) 119 kg (262 lb 5.6 oz) 120.5 kg (265 lb 10.5 oz)    Exam:   General:  Alert afebrile comfortable  Cardiovascular:  s1s2  Respiratory: diminished at bases,  Abdomen: mildly tender gen.   Musculoskeletal: 3 + pedal edema. upt o the level of hips.   Data Reviewed: Basic Metabolic Panel:  Recent Labs Lab 02/10/15 0157 02/11/15 0509 02/12/15 0348 02/12/15 0430 02/13/15 0420 02/14/15 0427  NA 135 138 135  --  132* 137  K 4.2 3.7 2.9*  --  3.0* 3.1*  CL 102 105 106  --  103 104  CO2 22 24 23   --  24 27  GLUCOSE 142* 123* 131*  --  101* 110*  BUN <5* 8 8  --  6 5*  CREATININE 0.56 0.65 0.52  --  0.45 0.48  CALCIUM 8.2* 8.2* 7.8*  --  7.8* 8.4*  MG  --   --  1.4* 1.5*  --  1.8  PHOS  --   --  2.0*  --   --  3.0   Liver Function Tests:  Recent Labs Lab 02/09/15 0512 02/09/15 2040 02/11/15 0509  AST 53* 50* 64*  ALT 26 25 22   ALKPHOS 198* 200* 128*  BILITOT 1.1 1.6* 2.0*  PROT 6.0* 6.3* 6.2*  ALBUMIN 2.4* 2.5* 2.7*    Recent Labs Lab 02/09/15 2040 02/11/15 0509 02/12/15 0348 02/13/15 0420  LIPASE 43 25 21 20    No results for input(s): AMMONIA in the last 168 hours. CBC:  Recent Labs Lab 02/09/15 2040 02/10/15 0157  02/11/15 0509 02/11/15 1400 02/12/15 0348 02/12/15 0550 02/13/15 0420 02/14/15 0427  WBC  15.7* 17.0*  --  21.2*  --  22.0*  --  16.7* 13.5*  NEUTROABS 13.4*  --   --   --   --   --   --   --   --   HGB 11.7* 11.2*  < > 8.7* 8.2* 7.9* 7.9* 7.6* 8.5*  HCT 36.3 34.1*  < > 26.2* 25.0* 24.1* 24.0* 23.3* 26.8*  MCV 98.9 98.3  --  93.9  --  95.6  --  95.5 98.9  PLT 412* 403*  --  303  --  325  --  342 437*  < > = values in this interval not displayed. Cardiac Enzymes: No results for input(s): CKTOTAL, CKMB, CKMBINDEX, TROPONINI in the last 168 hours. BNP (last 3 results)  Recent Labs  07/06/14 1255  BNP 79.2    ProBNP (last 3 results) No results for input(s): PROBNP in the last 8760 hours.  CBG: No results for input(s): GLUCAP in the last 168 hours.  Recent Results (from the past 240 hour(s))  Wet prep, genital     Status: None   Collection Time:  02/09/15  9:13 PM  Result Value Ref Range Status   Yeast Wet Prep HPF POC NONE SEEN NONE SEEN Final   Trich, Wet Prep NONE SEEN NONE SEEN Final   Clue Cells Wet Prep HPF POC NONE SEEN NONE SEEN Final   WBC, Wet Prep HPF POC NONE SEEN NONE SEEN Final  MRSA PCR Screening     Status: None   Collection Time: 02/10/15  1:04 AM  Result Value Ref Range Status   MRSA by PCR NEGATIVE NEGATIVE Final    Comment:        The GeneXpert MRSA Assay (FDA approved for NASAL specimens only), is one component of a comprehensive MRSA colonization surveillance program. It is not intended to diagnose MRSA infection nor to guide or monitor treatment for MRSA infections.      Studies: No results found.  Scheduled Meds: . anidulafungin  100 mg Intravenous Q24H  . antiseptic oral rinse  7 mL Mouth Rinse BID  . gabapentin  300 mg Oral BID WC  . gabapentin  600 mg Oral QHS  . imipenem-cilastatin  500 mg Intravenous 4 times per day  . loratadine  10 mg Oral Daily  . montelukast  10 mg Oral Daily  . multivitamin with minerals  1 tablet Oral Daily  . pantoprazole  40 mg Oral Daily   Continuous Infusions:    Principal Problem:   Abdominal hematoma Active Problems:   Chronic diastolic heart failure (HCC)   Intractable abdominal pain   Edema   Leukocytosis   Splenic vein thrombosis   Intra-abdominal hematoma   Warfarin-induced coagulopathy (HCC)   Hypertension   Hypoalbuminemia due to protein-calorie malnutrition (HCC)   Alcoholic pancreatitis   Acute blood loss anemia    Time spent: 25 minutes.     [facility]  [facility] Pager [phone]  If 7PM-7AM, please contact night-coverage at [email or web address], password [facility] 02/15/2015, 11:06 AM  LOS: 6 days

## 2015-02-16 ENCOUNTER — Inpatient Hospital Stay (HOSPITAL_COMMUNITY): Payer: BC Managed Care – PPO

## 2015-02-16 LAB — BASIC METABOLIC PANEL
ANION GAP: 7 (ref 5–15)
BUN: <5 mg/dL — ABNORMAL LOW (ref 6–20)
CALCIUM: 8.3 mg/dL — AB (ref 8.9–10.3)
CO2: 26 mmol/L (ref 22–32)
Chloride: 104 mmol/L (ref 101–111)
Creatinine, Ser: 0.5 mg/dL (ref 0.44–1.00)
GLUCOSE: 118 mg/dL — AB (ref 65–99)
Potassium: 3.1 mmol/L — ABNORMAL LOW (ref 3.5–5.1)
SODIUM: 137 mmol/L (ref 135–145)

## 2015-02-16 LAB — CBC
HCT: 31.7 % — ABNORMAL LOW (ref 36.0–46.0)
Hemoglobin: 10.3 g/dL — ABNORMAL LOW (ref 12.0–15.0)
MCH: 31.1 pg (ref 26.0–34.0)
MCHC: 32.5 g/dL (ref 30.0–36.0)
MCV: 95.8 fL (ref 78.0–100.0)
PLATELETS: 562 10*3/uL — AB (ref 150–400)
RBC: 3.31 MIL/uL — AB (ref 3.87–5.11)
RDW: 15 % (ref 11.5–15.5)
WBC: 17.6 10*3/uL — ABNORMAL HIGH (ref 4.0–10.5)

## 2015-02-16 LAB — URINE MICROSCOPIC-ADD ON

## 2015-02-16 LAB — URINALYSIS, ROUTINE W REFLEX MICROSCOPIC
Glucose, UA: NEGATIVE mg/dL
KETONES UR: NEGATIVE mg/dL
LEUKOCYTES UA: NEGATIVE
NITRITE: NEGATIVE
PH: 6.5 (ref 5.0–8.0)
PROTEIN: 30 mg/dL — AB
Specific Gravity, Urine: 1.018 (ref 1.005–1.030)
UROBILINOGEN UA: 1 mg/dL (ref 0.0–1.0)

## 2015-02-16 LAB — PROTIME-INR
INR: 1.13 (ref 0.00–1.49)
PROTHROMBIN TIME: 14.7 s (ref 11.6–15.2)

## 2015-02-16 MED ORDER — IOHEXOL 300 MG/ML  SOLN
100.0000 mL | Freq: Once | INTRAMUSCULAR | Status: AC | PRN
  Administered 2015-02-16: 100 mL via INTRAVENOUS

## 2015-02-16 MED ORDER — POTASSIUM CHLORIDE CRYS ER 20 MEQ PO TBCR
40.0000 meq | EXTENDED_RELEASE_TABLET | Freq: Two times a day (BID) | ORAL | Status: AC
  Administered 2015-02-16 (×2): 40 meq via ORAL
  Filled 2015-02-16 (×2): qty 2

## 2015-02-16 MED ORDER — IOHEXOL 300 MG/ML  SOLN
50.0000 mL | Freq: Once | INTRAMUSCULAR | Status: AC | PRN
  Administered 2015-02-16: 50 mL via ORAL

## 2015-02-16 MED ORDER — FUROSEMIDE 20 MG PO TABS
20.0000 mg | ORAL_TABLET | Freq: Every day | ORAL | Status: DC
  Administered 2015-02-16 – 2015-02-17 (×2): 20 mg via ORAL
  Filled 2015-02-16 (×2): qty 1

## 2015-02-16 NOTE — Progress Notes (Signed)
[facility] PROGRESS NOTE  Kara [name] WUJ:[id number]:[id number] DOB: 07/23/1985 DOA: 02/09/2015 PCP:  Duane Lopeoss, Alan, MD  Assessment/Plan:  29 Y/O with recent hospitalization for alcohol pancreatitis, splenic vein thrombosis recently discharged on anticoagulation admitted for abdominal pain  Found to have multiple fluid collections, probably pseudocysts and intra abd hemorrhage. She underwent IR guided embolization of the IMA branches.   Acute respiratory failure; On [place] oxygen to keep sats greater than 90%  Resolved.  CXR showed bilateral effusions.    Fever: Blood cultures ordered.  CXR SHOWS BILATERAL EFFUSIONS, UA is negative for infection.  Get CT abd and pelvis.    Hypovolemic shock from intraabdominal hemorrhage: Resolved with fluid resuscitation.  Decreased the fluid rate and now stop the fluids as her bp is stable.   Acute renal failure: From volume depletion.  Resolved.     hypokalemia: replete as needed.  Repeat in a;m.    abd hematoma: IR guided embolization of the IMA branches.  Abdominal pain improved.   Acute blood loss anemia :  from intra abd hemorrhage.  Monitor.  Stable around 8.    Code Status: full code.  Family Communication: noneat bedside Disposition Plan: pending PT eval   Consultants:  PCCM  SURGERY  Procedures:   IR guided embolization of the IMA branches.  Antibiotics:  anidulafungin 10/25   primaxin 10/24      HPI/Subjective: Reports pain in her feet from fluid, having a hard time walking.  Increased the oxycodone and discontinued the fluids.  Feeling a little better than yesterday.    Objective: Filed Vitals:   02/16/15 0600  BP: 122/75  Pulse: 115  Temp: 100.4 F (38 C)  Resp: 16    Intake/Output Summary (Last 24 hours) at 02/16/15 1434 Last data filed at 02/16/15 1230  Gross per 24 hour  Intake    340 ml  Output    700 ml  Net   -360 ml   Filed Weights   02/14/15 0509 02/15/15 0543 02/16/15 0500   Weight: 119 kg (262 lb 5.6 oz) 120.5 kg (265 lb 10.5 oz) 116.4 kg (256 lb 9.9 oz)    Exam:   General:  Alert febrile comfortable not in any distress.   Cardiovascular: s1s2  Respiratory: diminished at bases,no wheezing heard  Abdomen: mildly tender gen.   Musculoskeletal: 3 + pedal edema. upt o the level of hips.   Data Reviewed: Basic Metabolic Panel:  Recent Labs Lab 02/12/15 0348 02/12/15 0430 02/13/15 0420 02/14/15 0427 02/15/15 1652 02/16/15 0530  NA 135  --  132* 137 137 137  K 2.9*  --  3.0* 3.1* 2.9* 3.1*  CL 106  --  103 104 105 104  CO2 23  --  24 27 26 26   GLUCOSE 131*  --  101* 110* 107* 118*  BUN 8  --  6 5* <5* <5*  CREATININE 0.52  --  0.45 0.48 0.53 0.50  CALCIUM 7.8*  --  7.8* 8.4* 8.5* 8.3*  MG 1.4* 1.5*  --  1.8  --   --   PHOS 2.0*  --   --  3.0  --   --    Liver Function Tests:  Recent Labs Lab 02/09/15 2040 02/11/15 0509  AST 50* 64*  ALT 25 22  ALKPHOS 200* 128*  BILITOT 1.6* 2.0*  PROT 6.3* 6.2*  ALBUMIN 2.5* 2.7*    Recent Labs Lab 02/09/15 2040 02/11/15 0509 02/12/15 0348 02/13/15 0420  LIPASE 43 25 21 20  No results for input(s): AMMONIA in the last 168 hours. CBC:  Recent Labs Lab 02/09/15 2040 02/10/15 0157  02/11/15 0509 02/11/15 1400 02/12/15 0348 02/12/15 0550 02/13/15 0420 02/14/15 0427  WBC 15.7* 17.0*  --  21.2*  --  22.0*  --  16.7* 13.5*  NEUTROABS 13.4*  --   --   --   --   --   --   --   --   HGB 11.7* 11.2*  < > 8.7* 8.2* 7.9* 7.9* 7.6* 8.5*  HCT 36.3 34.1*  < > 26.2* 25.0* 24.1* 24.0* 23.3* 26.8*  MCV 98.9 98.3  --  93.9  --  95.6  --  95.5 98.9  PLT 412* 403*  --  303  --  325  --  342 437*  < > = values in this interval not displayed. Cardiac Enzymes: No results for input(s): CKTOTAL, CKMB, CKMBINDEX, TROPONINI in the last 168 hours. BNP (last 3 results)  Recent Labs  07/06/14 1255  BNP 79.2    ProBNP (last 3 results) No results for input(s): PROBNP in the last 8760  hours.  CBG: No results for input(s): GLUCAP in the last 168 hours.  Recent Results (from the past 240 hour(s))  Wet prep, genital     Status: None   Collection Time: 02/09/15  9:13 PM  Result Value Ref Range Status   Yeast Wet Prep HPF POC NONE SEEN NONE SEEN Final   Trich, Wet Prep NONE SEEN NONE SEEN Final   Clue Cells Wet Prep HPF POC NONE SEEN NONE SEEN Final   WBC, Wet Prep HPF POC NONE SEEN NONE SEEN Final  MRSA PCR Screening     Status: None   Collection Time: 02/10/15  1:04 AM  Result Value Ref Range Status   MRSA by PCR NEGATIVE NEGATIVE Final    Comment:        The GeneXpert MRSA Assay (FDA approved for NASAL specimens only), is one component of a comprehensive MRSA colonization surveillance program. It is not intended to diagnose MRSA infection nor to guide or monitor treatment for MRSA infections.      Studies: Dg Chest 2 View  02/16/2015  CLINICAL DATA:  Shortness of breath EXAM: CHEST  2 VIEW COMPARISON:  02/10/2015 FINDINGS: There is cardiac enlargement. Bilateral pleural effusions are identified left greater than right. No interstitial edema or airspace consolidation. IMPRESSION: 1. Cardiac enlargement and bilateral pleural effusions. Electronically Signed   By: Signa Kell M.D.   On: 02/16/2015 12:59    Scheduled Meds: . anidulafungin  100 mg Intravenous Q24H  . antiseptic oral rinse  7 mL Mouth Rinse BID  . furosemide  20 mg Oral Daily  . gabapentin  300 mg Oral BID WC  . gabapentin  600 mg Oral QHS  . imipenem-cilastatin  500 mg Intravenous 4 times per day  . loratadine  10 mg Oral Daily  . montelukast  10 mg Oral Daily  . multivitamin with minerals  1 tablet Oral Daily  . pantoprazole  40 mg Oral Daily  . potassium chloride  40 mEq Oral BID   Continuous Infusions:    Principal Problem:   Abdominal hematoma Active Problems:   Chronic diastolic heart failure (HCC)   Intractable abdominal pain   Edema   Leukocytosis   Splenic vein  thrombosis   Intra-abdominal hematoma   Warfarin-induced coagulopathy (HCC)   Hypertension   Hypoalbuminemia due to protein-calorie malnutrition (HCC)   Alcoholic pancreatitis   Acute  blood loss anemia    Time spent: 25 minutes.     [facility]  [facility] Pager [phone]  If 7PM-7AM, please contact night-coverage at [email or web address], password [facility] 02/16/2015, 2:34 PM  LOS: 7 days

## 2015-02-16 NOTE — Progress Notes (Signed)
Radiologist MD called with ct scan results. NP on call notified. Stat cbc done. NP notified of results and about pts temp being 103 and pulse 132.

## 2015-02-16 NOTE — Progress Notes (Signed)
ANTIBIOTIC CONSULT NOTE - Follow up  Pharmacy Consult for Primaxin Indication: Sepsis from [facility]  Allergies  Allergen Reactions  . Peanuts [Peanut Oil] Itching    Sensitivity     Patient Measurements: Height:  (165.1 cm) Weight: 256 lb 9.9 oz (116.4 kg) IBW/kg (Calculated) : 57  Vital Signs: Temp: 100.4 F (38 C) (10/30 0600) Temp Source: Oral (10/30 0600) BP: 122/75 mmHg (10/30 0600) Pulse Rate: 115 (10/30 0600) Intake/Output from previous day: 10/29 0701 - 10/30 0700 In: 580 [P.O.:480; IV Piggyback:100] Out: 1350 [Urine:1350] Intake/Output from this shift: Total I/O In: -  Out: 300 [Urine:300]  Labs:  Recent Labs  02/14/15 0427 02/15/15 1652 02/16/15 0530  WBC 13.5*  --   --   HGB 8.5*  --   --   PLT 437*  --   --   CREATININE 0.48 0.53 0.50   Estimated Creatinine Clearance: 131.2 mL/min (by C-G formula based on Cr of 0.5). No results for input(s): VANCOTROUGH, VANCOPEAK, VANCORANDOM, GENTTROUGH, GENTPEAK, GENTRANDOM, TOBRATROUGH, TOBRAPEAK, TOBRARND, AMIKACINPEAK, AMIKACINTROU, AMIKACIN in the last 72 hours.   Microbiology: Recent Results (from the past 720 hour(s))  Culture, blood (routine x 2)     Status: None   Collection Time: 01/31/15 12:17 PM  Result Value Ref Range Status   Specimen Description BLOOD RIGHT ANTECUBITAL  Final   Special Requests BOTTLES DRAWN AEROBIC AND ANAEROBIC 10CC  Final   Culture   Final    NO GROWTH 5 DAYS Performed at [facility]    Report Status 02/05/2015 FINAL  Final  Culture, blood (routine x 2)     Status: None   Collection Time: 01/31/15 12:17 PM  Result Value Ref Range Status   Specimen Description BLOOD RIGHT HAND  Final   Special Requests BOTTLES DRAWN AEROBIC AND ANAEROBIC 5CC  Final   Culture   Final    NO GROWTH 5 DAYS Performed at [facility]    Report Status 02/05/2015 FINAL  Final  Culture, Urine     Status: None   Collection Time: 02/03/15  3:47 AM  Result Value Ref Range Status    Specimen Description URINE, CLEAN CATCH  Final   Special Requests NONE  Final   Culture   Final    NO GROWTH 1 DAY Performed at [facility]    Report Status 02/04/2015 FINAL  Final  Wet prep, genital     Status: None   Collection Time: 02/09/15  9:13 PM  Result Value Ref Range Status   Yeast Wet Prep HPF POC NONE SEEN NONE SEEN Final   Trich, Wet Prep NONE SEEN NONE SEEN Final   Clue Cells Wet Prep HPF POC NONE SEEN NONE SEEN Final   WBC, Wet Prep HPF POC NONE SEEN NONE SEEN Final  MRSA PCR Screening     Status: None   Collection Time: 02/10/15  1:04 AM  Result Value Ref Range Status   MRSA by PCR NEGATIVE NEGATIVE Final    Comment:        The GeneXpert MRSA Assay (FDA approved for NASAL specimens only), is one component of a comprehensive MRSA colonization surveillance program. It is not intended to diagnose MRSA infection nor to guide or monitor treatment for MRSA infections.    Assessment: 29 Y/O with recent hospitalization for alcohol pancreatitis, splenic vein thrombosis. Discharged on anticoagulation and Cefuroxime/Flagyl; now admitted with abdominal pain and hypotension. Found to have multiple fluid collections in abd that are likely a combination  of pancreatic pseudocysts (w/out necrosis) and intraabdominal hemorrhage.  Pharmacy to begin Primaxin for intra-abdominal infection.  10/23 Cefuroxime/Flagyl >> 10/24 10/24 >> Primaxin >> 10/24 >> Eraxis (MD) >>    No Cx obtained  Today, 02/16/2015: Still with fevers WBC elevated but improving  Renal: SCr stable wnl  Goal of Therapy:   Eradication of infection Appropriate antibiotic dosing for indication and renal function  Plan:  Day 8 antibiotics(Day 7 Primaxin + Eraxis) Cont Primaxin 500mg  q6h. Anidulafungin 100mg  q24h per MD - appropriate for renal function and indication  Follow clinical course, renal function, culture results as available  Follow for de-escalation of antibiotics and  LOT  Haynes Hoehnolleen Gavynn Duvall, PharmD, BCPS 02/16/2015, 1:27 PM  Pager: [phone]

## 2015-02-17 ENCOUNTER — Encounter (HOSPITAL_COMMUNITY): Payer: Self-pay | Admitting: Gastroenterology

## 2015-02-17 DIAGNOSIS — R600 Localized edema: Secondary | ICD-10-CM

## 2015-02-17 DIAGNOSIS — R58 Hemorrhage, not elsewhere classified: Secondary | ICD-10-CM

## 2015-02-17 LAB — BASIC METABOLIC PANEL
ANION GAP: 4 — AB (ref 5–15)
BUN: <5 mg/dL — ABNORMAL LOW (ref 6–20)
CALCIUM: 8.4 mg/dL — AB (ref 8.9–10.3)
CO2: 29 mmol/L (ref 22–32)
CREATININE: 0.53 mg/dL (ref 0.44–1.00)
Chloride: 105 mmol/L (ref 101–111)
Glucose, Bld: 114 mg/dL — ABNORMAL HIGH (ref 65–99)
Potassium: 3.2 mmol/L — ABNORMAL LOW (ref 3.5–5.1)
SODIUM: 138 mmol/L (ref 135–145)

## 2015-02-17 LAB — CBC
HCT: 29.5 % — ABNORMAL LOW (ref 36.0–46.0)
Hemoglobin: 9.1 g/dL — ABNORMAL LOW (ref 12.0–15.0)
MCH: 30 pg (ref 26.0–34.0)
MCHC: 30.8 g/dL (ref 30.0–36.0)
MCV: 97.4 fL (ref 78.0–100.0)
Platelets: 542 10*3/uL — ABNORMAL HIGH (ref 150–400)
RBC: 3.03 MIL/uL — AB (ref 3.87–5.11)
RDW: 15.2 % (ref 11.5–15.5)
WBC: 13.6 10*3/uL — AB (ref 4.0–10.5)

## 2015-02-17 LAB — MAGNESIUM: MAGNESIUM: 1.8 mg/dL (ref 1.7–2.4)

## 2015-02-17 MED ORDER — FUROSEMIDE 40 MG PO TABS
40.0000 mg | ORAL_TABLET | Freq: Every day | ORAL | Status: DC
  Administered 2015-02-18: 40 mg via ORAL
  Filled 2015-02-17: qty 1

## 2015-02-17 MED ORDER — ALPRAZOLAM 0.25 MG PO TABS
0.2500 mg | ORAL_TABLET | Freq: Two times a day (BID) | ORAL | Status: DC | PRN
  Administered 2015-02-17 – 2015-02-20 (×5): 0.25 mg via ORAL
  Filled 2015-02-17 (×6): qty 1

## 2015-02-17 MED ORDER — POTASSIUM CHLORIDE CRYS ER 20 MEQ PO TBCR
40.0000 meq | EXTENDED_RELEASE_TABLET | Freq: Two times a day (BID) | ORAL | Status: AC
  Administered 2015-02-17 (×2): 40 meq via ORAL
  Filled 2015-02-17 (×2): qty 2

## 2015-02-17 NOTE — Consult Note (Signed)
[facility]       Reason for Consult: fever    Referring Physician: Dr. Blake DivineAkula  Principal Problem:   Abdominal hematoma Active Problems:   Chronic diastolic heart failure (HCC)   Intractable abdominal pain   Edema   Leukocytosis   Splenic vein thrombosis   Intra-abdominal hematoma   Warfarin-induced coagulopathy (HCC)   Hypertension   Hypoalbuminemia due to protein-calorie malnutrition (HCC)   Alcoholic pancreatitis   Acute blood loss anemia   . antiseptic oral rinse  7 mL Mouth Rinse BID  . [START ON 02/18/2015] furosemide  40 mg Oral Daily  . gabapentin  300 mg Oral BID WC  . gabapentin  600 mg Oral QHS  . loratadine  10 mg Oral Daily  . montelukast  10 mg Oral Daily  . multivitamin with minerals  1 tablet Oral Daily  . pantoprazole  40 mg Oral Daily  . potassium chloride  40 mEq Oral BID    Recommendations: Stop antibiotics   Dr. Daiva EvesVan Dam will follow up tomorrow  Assessment: She has resolving pancreatitis, intraabdominal hemorrhage, fever, persistent, despite broad spectrum antibiotics.  She feels better overall.  No chills.  With broad antibiotics and continued fever, I most suspect non-infectious cause including hematoma.  Could consider biopsy of necrosis if worsens again.   Also has lower extremity edema from fluid resuscitation.   Antibiotics: Anidulafungin, imipenem  HPI: Kara [name] is a 29 y.o. female with alcohol pancreatitis, s/p splenic vein thrombosis, on anticoagulation readmitted for abdominal pain.  Noted multiple fluid collections c/w hemorrhage.   Started emirically on imipenem and then anidulafungin but continued to have fever.  WBC improved.  Is currently eating regular diet, does not have any associated chills, no sob, no dysuria.   Vascular study negative for clot.  CT abdomen independently reviewed, no other findings other than hematoma.    Review of Systems:  Constitutional: negative for chills, fatigue and  malaise Gastrointestinal: negative for diarrhea and abdominal pain All other systems reviewed and are negative   Past Medical History  Diagnosis Date  . Asthma   . GERD (gastroesophageal reflux disease)   . Alcohol abuse   . Environmental allergies   . Headache   . Hypertension   . Syncope and collapse   . Vision abnormalities   . Prolonged QT syndrome     Social History  Substance Use Topics  . Smoking status: Former Smoker -- 0.50 packs/day for 2 years    Types: Cigarettes    Quit date: 04/08/2012  . Smokeless tobacco: Never Used  . Alcohol Use: 1.8 oz/week    0 Standard drinks or equivalent, 3 Shots of liquor per week     Comment: occasionally    Family History  Problem Relation Age of Onset  . Heart failure Mother   . Heart failure Father   . Heart failure Maternal Grandfather     Allergies  Allergen Reactions  . Peanuts [Peanut Oil] Itching    Sensitivity     Physical Exam: Constitutional: in no apparent distress and alert  Filed Vitals:   02/17/15 0545  BP: 117/60  Pulse: 117  Temp: 100.5 F (38.1 C)  Resp: 20   EYES: anicteric ENMT: no thrush Cardiovascular: Cor RRR and No murmurs Respiratory: clear; GI: Bowel sounds are normal, liver is not enlarged, spleen is not enlarged Musculoskeletal: peripheral pulses normal, has pedal edema 1+, no clubbing or cyanosis Skin: negatives: no rash Hematologic: no cervical  lad  Lab Results  Component Value Date   WBC 13.6* 02/17/2015   HGB 9.1* 02/17/2015   HCT 29.5* 02/17/2015   MCV 97.4 02/17/2015   PLT 542* 02/17/2015    Lab Results  Component Value Date   CREATININE 0.53 02/17/2015   BUN <5* 02/17/2015   NA 138 02/17/2015   K 3.2* 02/17/2015   CL 105 02/17/2015   CO2 29 02/17/2015    Lab Results  Component Value Date   ALT 22 02/11/2015   AST 64* 02/11/2015   ALKPHOS 128* 02/11/2015     Microbiology: Recent Results (from the past 240 hour(s))  Wet prep, genital     Status: None    Collection Time: 02/09/15  9:13 PM  Result Value Ref Range Status   Yeast Wet Prep HPF POC NONE SEEN NONE SEEN Final   Trich, Wet Prep NONE SEEN NONE SEEN Final   Clue Cells Wet Prep HPF POC NONE SEEN NONE SEEN Final   WBC, Wet Prep HPF POC NONE SEEN NONE SEEN Final  MRSA PCR Screening     Status: None   Collection Time: 02/10/15  1:04 AM  Result Value Ref Range Status   MRSA by PCR NEGATIVE NEGATIVE Final    Comment:        The GeneXpert MRSA Assay (FDA approved for NASAL specimens only), is one component of a comprehensive MRSA colonization surveillance program. It is not intended to diagnose MRSA infection nor to guide or monitor treatment for MRSA infections.   Culture, blood (routine x 2)     Status: None (Preliminary result)   Collection Time: 02/16/15  4:06 PM  Result Value Ref Range Status   Specimen Description BLOOD RIGHT ARM  Final   Special Requests   Final    BOTTLES DRAWN AEROBIC AND ANAEROBIC 10CC BOTH BOTTLES   Culture   Final    NO GROWTH < 24 HOURS Performed at [facility]    Report Status PENDING  Incomplete  Culture, blood (routine x 2)     Status: None (Preliminary result)   Collection Time: 02/16/15  4:20 PM  Result Value Ref Range Status   Specimen Description BLOOD LEFT ARM  Final   Special Requests   Final    BOTTLES DRAWN AEROBIC AND ANAEROBIC 10CC BOTH BOTTLES   Culture   Final    NO GROWTH < 24 HOURS Performed at [facility]    Report Status PENDING  Incomplete    Staci Righter, MD [facility] [email or web address] [phone] pager  [phone] cell 02/17/2015, 1:12 PM

## 2015-02-17 NOTE — Progress Notes (Signed)
[facility] PROGRESS NOTE  Kara [name] ZOX:[id number] DOB: [birth year] DOA: 02/09/2015 PCP:  Duane Lope, MD  Assessment/Plan:  29 Y/O with recent hospitalization for alcohol pancreatitis, splenic vein thrombosis recently discharged on anticoagulation admitted for abdominal pain  Found to have multiple fluid collections, probably pseudocysts and intra abd hemorrhage. She underwent IR guided embolization of the IMA branches.   Acute respiratory failure; On [place] oxygen to keep sats greater than 90%  Resolved.  CXR showed bilateral effusions.  Plan to wean her off the oxygen.    Fever: Blood cultures ordered.  CXR SHOWS BILATERAL EFFUSIONS, UA is negative for infection.  Repeat CT abd and pelvis showed improving pancreatitis but worsening of size of the hematoma. Curb side with surgery recommended watchful observation.  ID consulted and recommended stopping the antibiotics and watch her . Her leukocytosis is improving despite fevers.     Hypovolemic shock from intraabdominal hemorrhage: Resolved with fluid resuscitation.  Decreased the fluid rate and now stop the fluids as her bp is stable.   Acute renal failure: From volume depletion.  Resolved.     hypokalemia: replete as needed.  Repeat in am.   abd hematoma: IR guided embolization of the IMA branches.  Abdominal pain improved.   Acute blood loss anemia :  from intra abd hemorrhage.  Monitor.  Stable between 8 to 9.   Code Status: full code.  Family Communication: noneat bedside Disposition Plan: pending PT eval, and when fevers improve.    Consultants:  PCCM  SURGERY  ID  Gastroenterology.   Procedures:   IR guided embolization of the IMA branches.  Antibiotics:  anidulafungin 10/25   primaxin 10/24      HPI/Subjective: Pain is improving.   Objective: Filed Vitals:   02/17/15 1420  BP: 121/81  Pulse: 132  Temp: 100.3 F (37.9 C)  Resp: 18    Intake/Output Summary (Last 24  hours) at 02/17/15 1649 Last data filed at 02/17/15 1000  Gross per 24 hour  Intake    440 ml  Output      0 ml  Net    440 ml   Filed Weights   02/15/15 0543 02/16/15 0500 02/17/15 0558  Weight: 120.5 kg (265 lb 10.5 oz) 116.4 kg (256 lb 9.9 oz) 113.7 kg (250 lb 10.6 oz)    Exam:   General:  Alert febrile comfortable not in any distress.   Cardiovascular: s1s2  Respiratory: diminished at bases,no wheezing heard  Abdomen: mildly tender gen.   Musculoskeletal: 3 + pedal edema. upt o the level of hips.   Data Reviewed: Basic Metabolic Panel:  Recent Labs Lab 02/12/15 0348 02/12/15 0430 02/13/15 0420 02/14/15 0427 02/15/15 1652 02/16/15 0530 02/17/15 0521  NA 135  --  132* 137 137 137 138  K 2.9*  --  3.0* 3.1* 2.9* 3.1* 3.2*  CL 106  --  103 104 105 104 105  CO2 23  --  GLUCOSE 131*  --  101* 110* 107* 118* 114*  BUN 8  --  6 5* <5* <5* <5*  CREATININE 0.52  --  0.45 0.48 0.53 0.50 0.53  CALCIUM 7.8*  --  7.8* 8.4* 8.5* 8.3* 8.4*  MG 1.4* 1.5*  --  1.8  --   --  1.8  PHOS 2.0*  --   --  3.0  --   --   --    Liver Function Tests:  Recent Labs Lab 02/11/15 [phone]  AST 64*  ALT 22  ALKPHOS 128*  BILITOT 2.0*  PROT 6.2*  ALBUMIN 2.7*    Recent Labs Lab 02/11/15 0509 02/12/15 0348 02/13/15 0420  LIPASE 25 21 20    No results for input(s): AMMONIA in the last 168 hours. CBC:  Recent Labs Lab 02/12/15 0348 02/12/15 0550 02/13/15 0420 02/14/15 0427 02/16/15 2055 02/17/15 0521  WBC 22.0*  --  16.7* 13.5* 17.6* 13.6*  HGB 7.9* 7.9* 7.6* 8.5* 10.3* 9.1*  HCT 24.1* 24.0* 23.3* 26.8* 31.7* 29.5*  MCV 95.6  --  95.5 98.9 95.8 97.4  PLT 325  --  342 437* 562* 542*   Cardiac Enzymes: No results for input(s): CKTOTAL, CKMB, CKMBINDEX, TROPONINI in the last 168 hours. BNP (last 3 results)  Recent Labs  07/06/14 1255  BNP 79.2    ProBNP (last 3 results) No results for input(s): PROBNP in the last 8760 hours.  CBG: No results  for input(s): GLUCAP in the last 168 hours.  Recent Results (from the past 240 hour(s))  Wet prep, genital     Status: None   Collection Time: 02/09/15  9:13 PM  Result Value Ref Range Status   Yeast Wet Prep HPF POC NONE SEEN NONE SEEN Final   Trich, Wet Prep NONE SEEN NONE SEEN Final   Clue Cells Wet Prep HPF POC NONE SEEN NONE SEEN Final   WBC, Wet Prep HPF POC NONE SEEN NONE SEEN Final  MRSA PCR Screening     Status: None   Collection Time: 02/10/15  1:04 AM  Result Value Ref Range Status   MRSA by PCR NEGATIVE NEGATIVE Final    Comment:        The GeneXpert MRSA Assay (FDA approved for NASAL specimens only), is one component of a comprehensive MRSA colonization surveillance program. It is not intended to diagnose MRSA infection nor to guide or monitor treatment for MRSA infections.   Culture, blood (routine x 2)     Status: None (Preliminary result)   Collection Time: 02/16/15  4:06 PM  Result Value Ref Range Status   Specimen Description BLOOD RIGHT ARM  Final   Special Requests   Final    BOTTLES DRAWN AEROBIC AND ANAEROBIC 10CC BOTH BOTTLES   Culture   Final    NO GROWTH < 24 HOURS Performed at [facility]    Report Status PENDING  Incomplete  Culture, blood (routine x 2)     Status: None (Preliminary result)   Collection Time: 02/16/15  4:20 PM  Result Value Ref Range Status   Specimen Description BLOOD LEFT ARM  Final   Special Requests   Final    BOTTLES DRAWN AEROBIC AND ANAEROBIC 10CC BOTH BOTTLES   Culture   Final    NO GROWTH < 24 HOURS Performed [facility]    Report Status PENDING  Incomplete     Studies: Dg Chest 2 View  02/16/2015  CLINICAL DATA:  Shortness of breath EXAM: CHEST  2 VIEW COMPARISON:  02/10/2015 FINDINGS: There is cardiac enlargement. Bilateral pleural effusions are identified left greater than right. No interstitial edema or airspace consolidation. IMPRESSION: 1. Cardiac enlargement and bilateral pleural  effusions. Electronically Signed   By: Signa Kellaylor  Stroud M.D.   On: 02/16/2015 12:59   Ct Abdomen Pelvis W Contrast  02/16/2015  CLINICAL DATA:  Follow-up pancreatitis with hematoma formation. Status post embolization of inferior mesenteric artery bleeds. EXAM: CT ABDOMEN AND PELVIS WITH CONTRAST TECHNIQUE: Multidetector CT imaging of  the abdomen and pelvis was performed using the standard protocol following bolus administration of intravenous contrast. CONTRAST:  OMNIPAQUE IOHEXOL 300 MG/ML  SOLN COMPARISON:  CT abdomen and pelvis February 09, 2015 FINDINGS: LUNG BASES: Small, similar LEFT pleural effusion. Patchy consolidation in the lower lobes bilaterally with air bronchograms, small lingular consolidation. Heart size is normal, no pericardial effusions. SOLID ORGANS: The liver is diffusely hypodense compatible with steatosis, otherwise unremarkable. Contracted gallbladder. Adrenal glands and spleen are normal. 11 x 9 x 15.2 cm LEFT abdomen and pelvis heterogeneously dense hematoma was 7.4 x 10 x 13.7 cm. No central contrast extravasation, marginal metallic densities consistent with coil embolic material. Free fluid about the distal pancreatic body and tail, with faint hypo enhancement. Attenuated splenic vein, with occluded on recent angiogram. Pseudocyst contiguous with the stomach was 3.9 cm, now 3.3 cm. Retroperitoneal small pseudocyst was 3.6 cm, now 2.6 cm Spleen is otherwise unremarkable. Gallbladder, pancreas and adrenal glands are unremarkable. GASTROINTESTINAL TRACT: The stomach, small and large bowel are normal in course and caliber without inflammatory changes. Contrast in the colon. The appendix is not discretely identified, however there are no inflammatory changes in the right lower quadrant. KIDNEYS/ URINARY TRACT: Kidneys are orthotopic, demonstrating symmetric enhancement. No nephrolithiasis, hydronephrosis or solid renal masses. The unopacified ureters are normal in course and caliber.  Delayed imaging through the kidneys demonstrates symmetric prompt contrast excretion within the proximal urinary collecting system. Urinary bladder is partially distended and unremarkable. PERITONEUM/RETROPERITONEUM: Aortoiliac vessels are normal in course and caliber. Small central mesenteric lymph nodes. No lymphadenopathy by CT size criteria. Internal reproductive organs are unremarkable. Small amount of ascites, no intraperitoneal free air. SOFT TISSUE/OSSEOUS STRUCTURES: Mild anasarca. Scattered chronic Schmorl's nodes without destructive bony lesions. IMPRESSION: Enlarging 11 x 9 x 15.2 cm LEFT abdominal pelvic hematoma was 7.4 x 10 x 13.7 cm. No active contrast extravasation on this non angiographic phase though, continued bleeding is a consideration. Improving acute pancreatitis, with faint hypo enhancement in the tail of the pancreas concerning for necrosis. Decreasing size of distal peripancreatic pseudocysts. Bibasilar enhancing consolidation most consistent with atelectasis, less likely pneumonia with similar small LEFT pleural effusion. Severe hepatic steatosis. Electronically Signed   By: Awilda Metro M.D.   On: 02/16/2015 20:46    Scheduled Meds: . antiseptic oral rinse  7 mL Mouth Rinse BID  . [START ON 02/18/2015] furosemide  40 mg Oral Daily  . gabapentin  300 mg Oral BID WC  . gabapentin  600 mg Oral QHS  . loratadine  10 mg Oral Daily  . montelukast  10 mg Oral Daily  . multivitamin with minerals  1 tablet Oral Daily  . pantoprazole  40 mg Oral Daily  . potassium chloride  40 mEq Oral BID   Continuous Infusions:    Principal Problem:   Abdominal hematoma Active Problems:   Chronic diastolic heart failure (HCC)   Intractable abdominal pain   Edema   Leukocytosis   Splenic vein thrombosis   Intra-abdominal hematoma   Warfarin-induced coagulopathy (HCC)   Hypertension   Hypoalbuminemia due to protein-calorie malnutrition (HCC)   Alcoholic pancreatitis   Acute blood  loss anemia    Time spent: 25 minutes.     [facility]  [facility] Pager [phone]  If 7PM-7AM, please contact night-coverage at [email or web address], password [facility] 02/17/2015, 4:49 PM  LOS: 8 days

## 2015-02-17 NOTE — Consult Note (Signed)
Reason for Consult: Pancreatitis Referring Physician: Hospital team  Kara [name] is an 29 y.o. female.  HPI: Patient seen and examined and her hospital computer chart was reviewed and our office computer chart was reviewed and she is known to some of my partners from previous admission and from a pancreatitis standpoint she is doing quite well tolerating regular food with only some minimal discomfort when she lies flat however her case was complicated with some bleeding from her blood thinners which brought her back to the hospital status post angiogram and coils and on repeat CT the hematoma seems to be a little larger and her main complaint currently is her leg swelling and she has no new complaints  Past Medical History  Diagnosis Date  . Asthma   . GERD (gastroesophageal reflux disease)   . Alcohol abuse   . Environmental allergies   . Headache   . Hypertension   . Syncope and collapse   . Vision abnormalities   . Prolonged QT syndrome     Past Surgical History  Procedure Laterality Date  . Tympanostomy tube placement    . Esophagogastroduodenoscopy N/A 07/17/2014    Procedure: ESOPHAGOGASTRODUODENOSCOPY (EGD);  Surgeon: Teena Irani, MD;  Location: [facility];  Service: Endoscopy;  Laterality: N/A;    Family History  Problem Relation Age of Onset  . Heart failure Mother   . Heart failure Father   . Heart failure Maternal Grandfather     Social History:  reports that she quit smoking about 2 years ago. Her smoking use included Cigarettes. She has a 1 pack-year smoking history. She has never used smokeless tobacco. She reports that she drinks about 1.8 oz of alcohol per week. She reports that she does not use illicit drugs.  Allergies:  Allergies  Allergen Reactions  . Peanuts [Peanut Oil] Itching    Sensitivity     Medications: I have reviewed the patient's current medications.  Results for orders placed or performed during the hospital encounter of 02/09/15 (from  the past 48 hour(s))  Basic metabolic panel     Status: Abnormal   Collection Time: 02/15/15  4:52 PM  Result Value Ref Range   Sodium 137 135 - 145 mmol/L   Potassium 2.9 (L) 3.5 - 5.1 mmol/L   Chloride 105 101 - 111 mmol/L   CO2 26 22 - 32 mmol/L   Glucose, Bld 107 (H) 65 - 99 mg/dL   BUN <5 (L) 6 - 20 mg/dL   Creatinine, Ser 0.53 0.44 - 1.00 mg/dL   Calcium 8.5 (L) 8.9 - 10.3 mg/dL   GFR calc non Af Amer >60 >60 mL/min   GFR calc Af Amer >60 >60 mL/min    Comment: (NOTE) The eGFR has been calculated using the CKD EPI equation. This calculation has not been validated in all clinical situations. eGFR's persistently <60 mL/min signify possible Chronic Kidney Disease.    Anion gap 6 5 - 15  Protime-INR     Status: None   Collection Time: 02/16/15  5:30 AM  Result Value Ref Range   Prothrombin Time 14.7 11.6 - 15.2 seconds   INR 1.13 0.00 - 1.60  Basic metabolic panel     Status: Abnormal   Collection Time: 02/16/15  5:30 AM  Result Value Ref Range   Sodium 137 135 - 145 mmol/L   Potassium 3.1 (L) 3.5 - 5.1 mmol/L   Chloride 104 101 - 111 mmol/L   CO2 26 22 - 32 mmol/L   Glucose,  Bld 118 (H) 65 - 99 mg/dL   BUN <5 (L) 6 - 20 mg/dL   Creatinine, Ser 5.27 0.44 - 1.00 mg/dL   Calcium 8.3 (L) 8.9 - 10.3 mg/dL   GFR calc non Af Amer >60 >60 mL/min   GFR calc Af Amer >60 >60 mL/min    Comment: (NOTE) The eGFR has been calculated using the CKD EPI equation. This calculation has not been validated in all clinical situations. eGFR's persistently <60 mL/min signify possible Chronic Kidney Disease.    Anion gap 7 5 - 15  Urinalysis, Routine w reflex microscopic (not at [facility])     Status: Abnormal   Collection Time: 02/16/15 12:31 PM  Result Value Ref Range   Color, Urine AMBER (A) YELLOW    Comment: BIOCHEMICALS MAY BE AFFECTED BY COLOR   APPearance CLEAR CLEAR   Specific Gravity, Urine 1.018 1.005 - 1.030   pH 6.5 5.0 - 8.0   Glucose, UA NEGATIVE NEGATIVE mg/dL   Hgb urine  dipstick MODERATE (A) NEGATIVE   Bilirubin Urine SMALL (A) NEGATIVE   Ketones, ur NEGATIVE NEGATIVE mg/dL   Protein, ur 30 (A) NEGATIVE mg/dL   Urobilinogen, UA 1.0 0.0 - 1.0 mg/dL   Nitrite NEGATIVE NEGATIVE   Leukocytes, UA NEGATIVE NEGATIVE  Urine microscopic-add on     Status: Abnormal   Collection Time: 02/16/15 12:31 PM  Result Value Ref Range   Squamous Epithelial / LPF RARE RARE   WBC, UA 3-6 <3 WBC/hpf   RBC / HPF 0-2 <3 RBC/hpf   Crystals CA OXALATE CRYSTALS (A) NEGATIVE  Culture, blood (routine x 2)     Status: None (Preliminary result)   Collection Time: 02/16/15  4:06 PM  Result Value Ref Range   Specimen Description BLOOD RIGHT ARM    Special Requests      BOTTLES DRAWN AEROBIC AND ANAEROBIC 10CC BOTH BOTTLES   Culture      NO GROWTH < 24 HOURS Performed at [facility]    Report Status PENDING   Culture, blood (routine x 2)     Status: None (Preliminary result)   Collection Time: 02/16/15  4:20 PM  Result Value Ref Range   Specimen Description BLOOD LEFT ARM    Special Requests      BOTTLES DRAWN AEROBIC AND ANAEROBIC 10CC BOTH BOTTLES   Culture      NO GROWTH < 24 HOURS Performed at [facility]    Report Status PENDING   CBC     Status: Abnormal   Collection Time: 02/16/15  8:55 PM  Result Value Ref Range   WBC 17.6 (H) 4.0 - 10.5 K/uL   RBC 3.31 (L) 3.87 - 5.11 MIL/uL   Hemoglobin 10.3 (L) 12.0 - 15.0 g/dL   HCT 66.4 (L) 15.4 - 92.4 %   MCV 95.8 78.0 - 100.0 fL   MCH 31.1 26.0 - 34.0 pg   MCHC 32.5 30.0 - 36.0 g/dL   RDW 43.1 11.2 - 37.4 %   Platelets 562 (H) 150 - 400 K/uL  Basic metabolic panel     Status: Abnormal   Collection Time: 02/17/15  5:21 AM  Result Value Ref Range   Sodium 138 135 - 145 mmol/L   Potassium 3.2 (L) 3.5 - 5.1 mmol/L   Chloride 105 101 - 111 mmol/L   CO2 29 22 - 32 mmol/L   Glucose, Bld 114 (H) 65 - 99 mg/dL   BUN <5 (L) 6 - 20 mg/dL  Creatinine, Ser 0.53 0.44 - 1.00 mg/dL   Calcium 8.4 (L) 8.9 - 10.3  mg/dL   GFR calc non Af Amer >60 >60 mL/min   GFR calc Af Amer >60 >60 mL/min    Comment: (NOTE) The eGFR has been calculated using the CKD EPI equation. This calculation has not been validated in all clinical situations. eGFR's persistently <60 mL/min signify possible Chronic Kidney Disease.    Anion gap 4 (L) 5 - 15  Magnesium     Status: None   Collection Time: 02/17/15  5:21 AM  Result Value Ref Range   Magnesium 1.8 1.7 - 2.4 mg/dL  CBC     Status: Abnormal   Collection Time: 02/17/15  5:21 AM  Result Value Ref Range   WBC 13.6 (H) 4.0 - 10.5 K/uL   RBC 3.03 (L) 3.87 - 5.11 MIL/uL   Hemoglobin 9.1 (L) 12.0 - 15.0 g/dL   HCT 29.5 (L) 36.0 - 46.0 %   MCV 97.4 78.0 - 100.0 fL   MCH 30.0 26.0 - 34.0 pg   MCHC 30.8 30.0 - 36.0 g/dL   RDW 15.2 11.5 - 15.5 %   Platelets 542 (H) 150 - 400 K/uL    Dg Chest 2 View  02/16/2015  CLINICAL DATA:  Shortness of breath EXAM: CHEST  2 VIEW COMPARISON:  02/10/2015 FINDINGS: There is cardiac enlargement. Bilateral pleural effusions are identified left greater than right. No interstitial edema or airspace consolidation. IMPRESSION: 1. Cardiac enlargement and bilateral pleural effusions. Electronically Signed   By: Kerby Moors M.D.   On: 02/16/2015 12:59   Ct Abdomen Pelvis W Contrast  02/16/2015  CLINICAL DATA:  Follow-up pancreatitis with hematoma formation. Status post embolization of inferior mesenteric artery bleeds. EXAM: CT ABDOMEN AND PELVIS WITH CONTRAST TECHNIQUE: Multidetector CT imaging of the abdomen and pelvis was performed using the standard protocol following bolus administration of intravenous contrast. CONTRAST:  156mL OMNIPAQUE IOHEXOL 300 MG/ML  SOLN COMPARISON:  CT abdomen and pelvis February 09, 2015 FINDINGS: LUNG BASES: Small, similar LEFT pleural effusion. Patchy consolidation in the lower lobes bilaterally with air bronchograms, small lingular consolidation. Heart size is normal, no pericardial effusions. SOLID ORGANS:  The liver is diffusely hypodense compatible with steatosis, otherwise unremarkable. Contracted gallbladder. Adrenal glands and spleen are normal. 11 x 9 x 15.2 cm LEFT abdomen and pelvis heterogeneously dense hematoma was 7.4 x 10 x 13.7 cm. No central contrast extravasation, marginal metallic densities consistent with coil embolic material. Free fluid about the distal pancreatic body and tail, with faint hypo enhancement. Attenuated splenic vein, with occluded on recent angiogram. Pseudocyst contiguous with the stomach was 3.9 cm, now 3.3 cm. Retroperitoneal small pseudocyst was 3.6 cm, now 2.6 cm Spleen is otherwise unremarkable. Gallbladder, pancreas and adrenal glands are unremarkable. GASTROINTESTINAL TRACT: The stomach, small and large bowel are normal in course and caliber without inflammatory changes. Contrast in the colon. The appendix is not discretely identified, however there are no inflammatory changes in the right lower quadrant. KIDNEYS/ URINARY TRACT: Kidneys are orthotopic, demonstrating symmetric enhancement. No nephrolithiasis, hydronephrosis or solid renal masses. The unopacified ureters are normal in course and caliber. Delayed imaging through the kidneys demonstrates symmetric prompt contrast excretion within the proximal urinary collecting system. Urinary bladder is partially distended and unremarkable. PERITONEUM/RETROPERITONEUM: Aortoiliac vessels are normal in course and caliber. Small central mesenteric lymph nodes. No lymphadenopathy by CT size criteria. Internal reproductive organs are unremarkable. Small amount of ascites, no intraperitoneal free air. SOFT TISSUE/OSSEOUS STRUCTURES:  Mild anasarca. Scattered chronic Schmorl's nodes without destructive bony lesions. IMPRESSION: Enlarging 11 x 9 x 15.2 cm LEFT abdominal pelvic hematoma was 7.4 x 10 x 13.7 cm. No active contrast extravasation on this non angiographic phase though, continued bleeding is a consideration. Improving acute  pancreatitis, with faint hypo enhancement in the tail of the pancreas concerning for necrosis. Decreasing size of distal peripancreatic pseudocysts. Bibasilar enhancing consolidation most consistent with atelectasis, less likely pneumonia with similar small LEFT pleural effusion. Severe hepatic steatosis. Electronically Signed   By: Elon Alas M.D.   On: 02/16/2015 20:46    ROS negative except above Blood pressure 117/60, pulse 117, temperature 100.5 F (38.1 C), temperature source Oral, resp. rate 20, height $RemoveBe'5\' 5"'[id number]$  (1.651 m), weight 113.7 kg (250 lb 10.6 oz), last menstrual period 12/30/2014, SpO2 99 %. Physical Exam patient looks better than her story sounds vital signs stable low-grade temp lungs are clear heart regular rate and rhythm abdomen is soft nontender obvious pedal edema labs pertinent for a normal lipase decreasing white count hemoglobin dropped 1 g last CT as above  Assessment/Plan: Pancreatitis with thrombosis status post bleeding from blood thinners  Plan: Dr. Amedeo Plenty my partner is happy to see back one week after discharge and continue medical management per hospital team and if signs of bleeding continue either reconsult surgery or interventional radiology and could consider a a nuclear bleeding scan if needed but would ask interventional radiology first and please let me know if I can be of any further assistance with this hospital stay [facility] 02/17/2015, 11:24 AM

## 2015-02-17 NOTE — Progress Notes (Signed)
Received from 5W to room 1437. Mild temperature elevation 100.3 otherwise VS stable. Pt alert and oriented and no acute changes noted from previous assessment.

## 2015-02-17 NOTE — Progress Notes (Signed)
Pt has new order for telemetry. [facility] Transferred to room 1436 via wc .

## 2015-02-18 DIAGNOSIS — Z7901 Long term (current) use of anticoagulants: Secondary | ICD-10-CM

## 2015-02-18 DIAGNOSIS — F1020 Alcohol dependence, uncomplicated: Secondary | ICD-10-CM

## 2015-02-18 DIAGNOSIS — I82891 Chronic embolism and thrombosis of other specified veins: Secondary | ICD-10-CM

## 2015-02-18 DIAGNOSIS — R509 Fever, unspecified: Secondary | ICD-10-CM | POA: Insufficient documentation

## 2015-02-18 DIAGNOSIS — R1909 Other intra-abdominal and pelvic swelling, mass and lump: Secondary | ICD-10-CM

## 2015-02-18 LAB — BASIC METABOLIC PANEL
ANION GAP: 6 (ref 5–15)
CALCIUM: 8.2 mg/dL — AB (ref 8.9–10.3)
CO2: 29 mmol/L (ref 22–32)
Chloride: 103 mmol/L (ref 101–111)
Creatinine, Ser: 0.45 mg/dL (ref 0.44–1.00)
GFR calc Af Amer: >60 mL/min (ref >60)
GFR calc non Af Amer: >60 mL/min (ref >60)
GLUCOSE: 119 mg/dL — AB (ref 65–99)
POTASSIUM: 3.2 mmol/L — AB (ref 3.5–5.1)
Sodium: 138 mmol/L (ref 135–145)

## 2015-02-18 LAB — CBC
HEMATOCRIT: 27.7 % — AB (ref 36.0–46.0)
HEMOGLOBIN: 8.8 g/dL — AB (ref 12.0–15.0)
MCH: 31.4 pg (ref 26.0–34.0)
MCHC: 31.8 g/dL (ref 30.0–36.0)
MCV: 98.9 fL (ref 78.0–100.0)
Platelets: 476 10*3/uL — ABNORMAL HIGH (ref 150–400)
RBC: 2.8 MIL/uL — ABNORMAL LOW (ref 3.87–5.11)
RDW: 15.1 % (ref 11.5–15.5)
WBC: 10.4 10*3/uL (ref 4.0–10.5)

## 2015-02-18 MED ORDER — HYDROMORPHONE HCL 1 MG/ML IJ SOLN
0.5000 mg | INTRAMUSCULAR | Status: DC | PRN
  Administered 2015-02-18 – 2015-02-20 (×11): 0.5 mg via INTRAVENOUS
  Filled 2015-02-18 (×11): qty 1

## 2015-02-18 MED ORDER — METOPROLOL SUCCINATE ER 25 MG PO TB24
25.0000 mg | ORAL_TABLET | Freq: Every day | ORAL | Status: DC
  Administered 2015-02-18 – 2015-02-21 (×4): 25 mg via ORAL
  Filled 2015-02-18 (×4): qty 1

## 2015-02-18 MED ORDER — OXYCODONE HCL 5 MG PO TABS
10.0000 mg | ORAL_TABLET | ORAL | Status: DC | PRN
  Administered 2015-02-18 – 2015-02-21 (×16): 10 mg via ORAL
  Filled 2015-02-18 (×16): qty 2

## 2015-02-18 MED ORDER — POTASSIUM CHLORIDE CRYS ER 20 MEQ PO TBCR
40.0000 meq | EXTENDED_RELEASE_TABLET | Freq: Two times a day (BID) | ORAL | Status: AC
  Administered 2015-02-18 (×2): 40 meq via ORAL
  Filled 2015-02-18 (×2): qty 2

## 2015-02-18 NOTE — Progress Notes (Signed)
[facility] PROGRESS NOTE  Kara [name] ZOX:[id number] DOB: 20-Feb-1986 DOA: 02/09/2015 PCP:  Duane Lope, MD  Assessment/Plan:  29 Y/O with recent hospitalization for alcohol pancreatitis, splenic vein thrombosis recently discharged on anticoagulation admitted for abdominal pain  Found to have multiple fluid collections, probably pseudocysts and intra abd hemorrhage. She underwent IR guided embolization of the IMA branches.  She was on antibiotics pro phylactically for her pancreatitis. She was spiking fevers over the weekend without any source of infection. ID consulted and recommended to stop the antibiotics and watch her off antibiotics.  Today she does not have any fever. Her pain is better controlled, but as per RN, pt is constantly asking for her dilaudid. i increased her oxy and decreased her dilaudid to 0.5 every 4 hours.   Acute respiratory failure; On [place] oxygen to keep sats greater than 90%  Resolved.  CXR showed bilateral effusions.  Plan to wean her off the oxygen.    Fever: Blood cultures ordered and negative so far. CXR SHOWS BILATERAL EFFUSIONS, UA is negative for infection.  Repeat CT abd and pelvis showed improving pancreatitis but worsening of size of the hematoma. Curb side with surgery recommended watchful observation.  ID consulted and recommended stopping the antibiotics and watch her . Her leukocytosis is improving despite fevers.  Monitor.     Hypovolemic shock from intraabdominal hemorrhage: Resolved with fluid resuscitation.  Decreased the fluid rate and now stop the fluids as her bp is stable.  She is started on lasix for diuresis, she is fluid overloaded from the resuscitation.   Acute renal failure: From volume depletion.  Resolved.     hypokalemia: replete as needed.  Repeat in am.   Alcoholic pancreatitis, splenic vein thrombosis and abd hematoma: IR guided embolization of the IMA branches.  Abdominal pain improved.but repeat CT shows  enlargement of the abdominal hematoma's, despite stable hemoglobin. Gi consulted for recommendations. No intervention at this time.  If she has worsening hemorrhage, plan to contact IR for nuclear bleeding scan.   Acute blood loss anemia :  from intra abd hemorrhage.  Monitor.  Stable between 8 to 9.   Tachycardia; On metoprolol at home, which was resumed today.   Code Status: full code.  Family Communication: none at bedside Disposition Plan: home when afebrile for >24 hours.    Consultants:  PCCM  SURGERY  ID  Gastroenterology.   IR  Procedures:   IR guided embolization of the IMA branches.  Antibiotics:  anidulafungin 10/25 D/C ON 10/31  primaxin 10/24- 10/31      HPI/Subjective: In tears from not getting dilaudid on time. But reports overall she feels better and pain is getting better.   Objective: Filed Vitals:   02/18/15 0415  BP: 104/72  Pulse: 111  Temp: 98.9 F (37.2 C)  Resp: 18    Intake/Output Summary (Last 24 hours) at 02/18/15 1158 Last data filed at 02/17/15 1500  Gross per 24 hour  Intake    240 ml  Output      0 ml  Net    240 ml   Filed Weights   02/16/15 0500 02/17/15 0558 02/18/15 0424  Weight: 116.4 kg (256 lb 9.9 oz) 113.7 kg (250 lb 10.6 oz) 111.8 kg (246 lb 7.6 oz)    Exam:   General:  Alert febrile comfortable not in any distress.   Cardiovascular: s1s2  Respiratory: diminished at bases,no wheezing heard  Abdomen: mildly tender gen.   Musculoskeletal: 3 + pedal edema. upt  o the level of hips.   Data Reviewed: Basic Metabolic Panel:  Recent Labs Lab 02/12/15 0348 02/12/15 0430  02/14/15 0427 02/15/15 1652 02/16/15 0530 02/17/15 0521 02/18/15 0448  NA 135  --   < > 137 137 137 138 138  K 2.9*  --   < > 3.1* 2.9* 3.1* 3.2* 3.2*  CL 106  --   < > 104 105 104 105 103  CO2 23  --   < > 27 26 26 29 29   GLUCOSE 131*  --   < > 110* 107* 118* 114* 119*  BUN 8  --   < > 5* <5* <5* <5* <5*  CREATININE 0.52   --   < > 0.48 0.53 0.50 0.53 0.45  CALCIUM 7.8*  --   < > 8.4* 8.5* 8.3* 8.4* 8.2*  MG 1.4* 1.5*  --  1.8  --   --  1.8  --   PHOS 2.0*  --   --  3.0  --   --   --   --   < > = values in this interval not displayed. Liver Function Tests: No results for input(s): AST, ALT, ALKPHOS, BILITOT, PROT, ALBUMIN in the last 168 hours.  Recent Labs Lab 02/12/15 0348 02/13/15 0420  LIPASE 21 20   No results for input(s): AMMONIA in the last 168 hours. CBC:  Recent Labs Lab 02/13/15 0420 02/14/15 0427 02/16/15 2055 02/17/15 0521 02/18/15 0448  WBC 16.7* 13.5* 17.6* 13.6* 10.4  HGB 7.6* 8.5* 10.3* 9.1* 8.8*  HCT 23.3* 26.8* 31.7* 29.5* 27.7*  MCV 95.5 98.9 95.8 97.4 98.9  PLT 342 437* 562* 542* 476*   Cardiac Enzymes: No results for input(s): CKTOTAL, CKMB, CKMBINDEX, TROPONINI in the last 168 hours. BNP (last 3 results)  Recent Labs  07/06/14 1255  BNP 79.2    ProBNP (last 3 results) No results for input(s): PROBNP in the last 8760 hours.  CBG: No results for input(s): GLUCAP in the last 168 hours.  Recent Results (from the past 240 hour(s))  Wet prep, genital     Status: None   Collection Time: 02/09/15  9:13 PM  Result Value Ref Range Status   Yeast Wet Prep HPF POC NONE SEEN NONE SEEN Final   Trich, Wet Prep NONE SEEN NONE SEEN Final   Clue Cells Wet Prep HPF POC NONE SEEN NONE SEEN Final   WBC, Wet Prep HPF POC NONE SEEN NONE SEEN Final  MRSA PCR Screening     Status: None   Collection Time: 02/10/15  1:04 AM  Result Value Ref Range Status   MRSA by PCR NEGATIVE NEGATIVE Final    Comment:        The GeneXpert MRSA Assay (FDA approved for NASAL specimens only), is one component of a comprehensive MRSA colonization surveillance program. It is not intended to diagnose MRSA infection nor to guide or monitor treatment for MRSA infections.   Culture, blood (routine x 2)     Status: None (Preliminary result)   Collection Time: 02/16/15  4:06 PM  Result Value  Ref Range Status   Specimen Description BLOOD RIGHT ARM  Final   Special Requests   Final    BOTTLES DRAWN AEROBIC AND ANAEROBIC 10CC BOTH BOTTLES   Culture   Final    NO GROWTH < 24 HOURS [facility]    Report Status PENDING  Incomplete  Culture, blood (routine x 2)     Status: None (  Preliminary result)   Collection Time: 02/16/15  4:20 PM  Result Value Ref Range Status   Specimen Description BLOOD LEFT ARM  Final   Special Requests   Final    BOTTLES DRAWN AEROBIC AND ANAEROBIC 10CC BOTH BOTTLES   Culture   Final    NO GROWTH < 24 HOURS Performed at [facility]    Report Status PENDING  Incomplete     Studies: Dg Chest 2 View  02/16/2015  CLINICAL DATA:  Shortness of breath EXAM: CHEST  2 VIEW COMPARISON:  02/10/2015 FINDINGS: There is cardiac enlargement. Bilateral pleural effusions are identified left greater than right. No interstitial edema or airspace consolidation. IMPRESSION: 1. Cardiac enlargement and bilateral pleural effusions. Electronically Signed   By: Signa Kell M.D.   On: 02/16/2015 12:59   Ct Abdomen Pelvis W Contrast  02/16/2015  CLINICAL DATA:  Follow-up pancreatitis with hematoma formation. Status post embolization of inferior mesenteric artery bleeds. EXAM: CT ABDOMEN AND PELVIS WITH CONTRAST TECHNIQUE: Multidetector CT imaging of the abdomen and pelvis was performed using the standard protocol following bolus administration of intravenous contrast. CONTRAST:  OMNIPAQUE IOHEXOL 300 MG/ML  SOLN COMPARISON:  CT abdomen and pelvis February 09, 2015 FINDINGS: LUNG BASES: Small, similar LEFT pleural effusion. Patchy consolidation in the lower lobes bilaterally with air bronchograms, small lingular consolidation. Heart size is normal, no pericardial effusions. SOLID ORGANS: The liver is diffusely hypodense compatible with steatosis, otherwise unremarkable. Contracted gallbladder. Adrenal glands and spleen are normal. 11 x 9 x 15.2 cm  LEFT abdomen and pelvis heterogeneously dense hematoma was 7.4 x 10 x 13.7 cm. No central contrast extravasation, marginal metallic densities consistent with coil embolic material. Free fluid about the distal pancreatic body and tail, with faint hypo enhancement. Attenuated splenic vein, with occluded on recent angiogram. Pseudocyst contiguous with the stomach was 3.9 cm, now 3.3 cm. Retroperitoneal small pseudocyst was 3.6 cm, now 2.6 cm Spleen is otherwise unremarkable. Gallbladder, pancreas and adrenal glands are unremarkable. GASTROINTESTINAL TRACT: The stomach, small and large bowel are normal in course and caliber without inflammatory changes. Contrast in the colon. The appendix is not discretely identified, however there are no inflammatory changes in the right lower quadrant. KIDNEYS/ URINARY TRACT: Kidneys are orthotopic, demonstrating symmetric enhancement. No nephrolithiasis, hydronephrosis or solid renal masses. The unopacified ureters are normal in course and caliber. Delayed imaging through the kidneys demonstrates symmetric prompt contrast excretion within the proximal urinary collecting system. Urinary bladder is partially distended and unremarkable. PERITONEUM/RETROPERITONEUM: Aortoiliac vessels are normal in course and caliber. Small central mesenteric lymph nodes. No lymphadenopathy by CT size criteria. Internal reproductive organs are unremarkable. Small amount of ascites, no intraperitoneal free air. SOFT TISSUE/OSSEOUS STRUCTURES: Mild anasarca. Scattered chronic Schmorl's nodes without destructive bony lesions. IMPRESSION: Enlarging 11 x 9 x 15.2 cm LEFT abdominal pelvic hematoma was 7.4 x 10 x 13.7 cm. No active contrast extravasation on this non angiographic phase though, continued bleeding is a consideration. Improving acute pancreatitis, with faint hypo enhancement in the tail of the pancreas concerning for necrosis. Decreasing size of distal peripancreatic pseudocysts. Bibasilar enhancing  consolidation most consistent with atelectasis, less likely pneumonia with similar small LEFT pleural effusion. Severe hepatic steatosis. Electronically Signed   By: Awilda Metro M.D.   On: 02/16/2015 20:46    Scheduled Meds: . antiseptic oral rinse  7 mL Mouth Rinse BID  . furosemide  40 mg Oral Daily  . gabapentin  300 mg Oral BID WC  . gabapentin  600 mg Oral QHS  . loratadine  10 mg Oral Daily  . metoprolol succinate  25 mg Oral Daily  . montelukast  10 mg Oral Daily  . multivitamin with minerals  1 tablet Oral Daily  . pantoprazole  40 mg Oral Daily  . potassium chloride  40 mEq Oral BID   Continuous Infusions:    Principal Problem:   Abdominal hematoma Active Problems:   Chronic diastolic heart failure (HCC)   Intractable abdominal pain   Edema   Leukocytosis   Splenic vein thrombosis   Intra-abdominal hematoma   Warfarin-induced coagulopathy (HCC)   Hypertension   Hypoalbuminemia due to protein-calorie malnutrition (HCC)   Alcoholic pancreatitis   Acute blood loss anemia    Time spent: 25 minutes.     [facility]  [facility] Pager [phone]  If 7PM-7AM, please contact night-coverage at [email or web address], password [facility] 02/18/2015, 11:58 AM  LOS: 9 days

## 2015-02-18 NOTE — Progress Notes (Addendum)
[facility]    Subjective: No new complaints   Antibiotics:  Anti-infectives    Start     Dose/Rate Route Frequency Ordered Stop   02/11/15 1200  anidulafungin (ERAXIS) 100 mg in sodium chloride 0.9 % 100 mL IVPB  Status:  Discontinued     100 mg over 90 Minutes Intravenous Every 24 hours 02/10/15 1116 02/17/15 1312   02/10/15 1200  anidulafungin (ERAXIS) 200 mg in sodium chloride 0.9 % 200 mL IVPB     200 mg over 180 Minutes Intravenous  Once 02/10/15 1116 02/10/15 1540   02/10/15 1115  anidulafungin (ERAXIS) 100 mg in sodium chloride 0.9 % 100 mL IVPB  Status:  Discontinued     100 mg over 90 Minutes Intravenous Every 24 hours 02/10/15 1106 02/10/15 1115   02/10/15 1100  imipenem-cilastatin (PRIMAXIN) 500 mg in sodium chloride 0.9 % 100 mL IVPB  Status:  Discontinued     500 mg 200 mL/hr over 30 Minutes Intravenous 4 times per day 02/10/15 1041 02/17/15 1312   02/10/15 0800  cefUROXime (CEFTIN) tablet 500 mg  Status:  Discontinued     500 mg Oral 2 times daily with meals 02/10/15 0102 02/10/15 1015   02/10/15 0115  metroNIDAZOLE (FLAGYL) tablet 500 mg  Status:  Discontinued     500 mg Oral 3 times per day 02/10/15 0102 02/10/15 1015      Medications: Scheduled Meds: . antiseptic oral rinse  7 mL Mouth Rinse BID  . furosemide  40 mg Oral Daily  . gabapentin  300 mg Oral BID WC  . gabapentin  600 mg Oral QHS  . loratadine  10 mg Oral Daily  . metoprolol succinate  25 mg Oral Daily  . montelukast  10 mg Oral Daily  . multivitamin with minerals  1 tablet Oral Daily  . pantoprazole  40 mg Oral Daily  . potassium chloride  40 mEq Oral BID   Continuous Infusions:  PRN Meds:.acetaminophen **OR** acetaminophen, ALPRAZolam, alum & mag hydroxide-simeth, HYDROmorphone (DILAUDID) injection, ondansetron **OR** ondansetron (ZOFRAN) IV, oxyCODONE    Objective: Weight change: -4 lb 3 oz (-1.9 kg) No intake or output data in the 24  hours ending 02/18/15 1538 Blood pressure 120/70, pulse 127, temperature 100 F (37.8 C), temperature source Oral, resp. rate 18, height  (1.651 m), weight 246 lb 7.6 oz (111.8 kg), last menstrual period 12/30/2014, SpO2 94 %. Temp:  [98.9 F (37.2 C)-101.4 F (38.6 C)] 100 F (37.8 C) (11/01 1326) Pulse Rate:  [111-127] 127 (11/01 1326) Resp:  [18] 18 (11/01 1326) BP: (104-125)/(70-84) 120/70 mmHg (11/01 1326) SpO2:  [94 %-99 %] 94 % (11/01 1326) Weight:  [246 lb 7.6 oz (111.8 kg)] 246 lb 7.6 oz (111.8 kg) (11/01 0424)  Physical Exam: General: Alert and awake, oriented x3, not in any acute distress. HEENT: , EOMI CVS regular rate, normal r,  no murmur rubs or gallops Chest: clear to auscultation bilaterally, no wheezing, rales or rhonchi Abdomen: soft  nondistended,  Skin: no rashes Neuro: nonfocal  CBC:  CBC Latest Ref Rng 02/18/2015 02/17/2015 02/16/2015  WBC 4.0 - 10.5 K/uL 10.4 13.6(H) 17.6(H)  Hemoglobin 12.0 - 15.0 g/dL 4.0(J) 8.1(X) 10.3(L)  Hematocrit 36.0 - 46.0 % 27.7(L) 29.5(L) 31.7(L)  Platelets 150 - 400 K/uL 476(H) 542(H) 562(H)       BMET  Recent Labs  02/17/15 0521 02/18/15 0448  NA 138 138  K 3.2* 3.2*  CL 105 103  CO2 29 29  GLUCOSE 114* 119*  BUN <5* <5*  CREATININE 0.53 0.45  CALCIUM 8.4* 8.2*     Liver Panel  No results for input(s): PROT, ALBUMIN, AST, ALT, ALKPHOS, BILITOT, BILIDIR, IBILI in the last 72 hours.     Sedimentation Rate No results for input(s): ESRSEDRATE in the last 72 hours. C-Reactive Protein No results for input(s): CRP in the last 72 hours.  Micro Results: Recent Results (from the past 720 hour(s))  Culture, blood (routine x 2)     Status: None   Collection Time: 01/31/15 12:17 PM  Result Value Ref Range Status   Specimen Description BLOOD RIGHT ANTECUBITAL  Final   Special Requests BOTTLES DRAWN AEROBIC AND ANAEROBIC 10CC  Final   Culture   Final    NO GROWTH 5 DAYS [facility]    Report Status 02/05/2015 FINAL  Final  Culture, blood (routine x 2)     Status: None   Collection Time: 01/31/15 12:17 PM  Result Value Ref Range Status   Specimen Description BLOOD RIGHT HAND  Final   Special Requests BOTTLES DRAWN AEROBIC AND ANAEROBIC 5CC  Final   Culture   Final    NO GROWTH 5 DAYS Performed at [facility]    Report Status 02/05/2015 FINAL  Final  Culture, Urine     Status: None   Collection Time: 02/03/15  3:47 AM  Result Value Ref Range Status   Specimen Description URINE, CLEAN CATCH  Final   Special Requests NONE  Final   Culture   Final    NO GROWTH 1 DAY Performed [facility]    Report Status 02/04/2015 FINAL  Final  Wet prep, genital     Status: None   Collection Time: 02/09/15  9:13 PM  Result Value Ref Range Status   Yeast Wet Prep HPF POC NONE SEEN NONE SEEN Final   Trich, Wet Prep NONE SEEN NONE SEEN Final   Clue Cells Wet Prep HPF POC NONE SEEN NONE SEEN Final   WBC, Wet Prep HPF POC NONE SEEN NONE SEEN Final  MRSA PCR Screening     Status: None   Collection Time: 02/10/15  1:04 AM  Result Value Ref Range Status   MRSA by PCR NEGATIVE NEGATIVE Final    Comment:        The GeneXpert MRSA Assay (FDA approved for NASAL specimens only), is one component of a comprehensive MRSA colonization surveillance program. It is not intended to diagnose MRSA infection nor to guide or monitor treatment for MRSA infections.   Culture, blood (routine x 2)     Status: None (Preliminary result)   Collection Time: 02/16/15  4:06 PM  Result Value Ref Range Status   Specimen Description BLOOD RIGHT ARM  Final   Special Requests   Final    BOTTLES DRAWN AEROBIC AND ANAEROBIC 10CC BOTH BOTTLES   Culture   Final    NO GROWTH 2 DAYS [facility]    Report Status PENDING  Incomplete  Culture, blood (routine x 2)     Status: None (Preliminary result)   Collection Time: 02/16/15  4:20 PM  Result Value Ref  Range Status   Specimen Description BLOOD LEFT ARM  Final   Special Requests   Final    BOTTLES DRAWN AEROBIC AND ANAEROBIC 10CC BOTH BOTTLES   Culture   Final  NO GROWTH 2 DAYS Performed at [facility]    Report Status PENDING  Incomplete    Studies/Results: Ct Abdomen Pelvis W Contrast  02/16/2015  CLINICAL DATA:  Follow-up pancreatitis with hematoma formation. Status post embolization of inferior mesenteric artery bleeds. EXAM: CT ABDOMEN AND PELVIS WITH CONTRAST TECHNIQUE: Multidetector CT imaging of the abdomen and pelvis was performed using the standard protocol following bolus administration of intravenous contrast. CONTRAST:  OMNIPAQUE IOHEXOL 300 MG/ML  SOLN COMPARISON:  CT abdomen and pelvis February 09, 2015 FINDINGS: LUNG BASES: Small, similar LEFT pleural effusion. Patchy consolidation in the lower lobes bilaterally with air bronchograms, small lingular consolidation. Heart size is normal, no pericardial effusions. SOLID ORGANS: The liver is diffusely hypodense compatible with steatosis, otherwise unremarkable. Contracted gallbladder. Adrenal glands and spleen are normal. 11 x 9 x 15.2 cm LEFT abdomen and pelvis heterogeneously dense hematoma was 7.4 x 10 x 13.7 cm. No central contrast extravasation, marginal metallic densities consistent with coil embolic material. Free fluid about the distal pancreatic body and tail, with faint hypo enhancement. Attenuated splenic vein, with occluded on recent angiogram. Pseudocyst contiguous with the stomach was 3.9 cm, now 3.3 cm. Retroperitoneal small pseudocyst was 3.6 cm, now 2.6 cm Spleen is otherwise unremarkable. Gallbladder, pancreas and adrenal glands are unremarkable. GASTROINTESTINAL TRACT: The stomach, small and large bowel are normal in course and caliber without inflammatory changes. Contrast in the colon. The appendix is not discretely identified, however there are no inflammatory changes in the right lower quadrant.  KIDNEYS/ URINARY TRACT: Kidneys are orthotopic, demonstrating symmetric enhancement. No nephrolithiasis, hydronephrosis or solid renal masses. The unopacified ureters are normal in course and caliber. Delayed imaging through the kidneys demonstrates symmetric prompt contrast excretion within the proximal urinary collecting system. Urinary bladder is partially distended and unremarkable. PERITONEUM/RETROPERITONEUM: Aortoiliac vessels are normal in course and caliber. Small central mesenteric lymph nodes. No lymphadenopathy by CT size criteria. Internal reproductive organs are unremarkable. Small amount of ascites, no intraperitoneal free air. SOFT TISSUE/OSSEOUS STRUCTURES: Mild anasarca. Scattered chronic Schmorl's nodes without destructive bony lesions. IMPRESSION: Enlarging 11 x 9 x 15.2 cm LEFT abdominal pelvic hematoma was 7.4 x 10 x 13.7 cm. No active contrast extravasation on this non angiographic phase though, continued bleeding is a consideration. Improving acute pancreatitis, with faint hypo enhancement in the tail of the pancreas concerning for necrosis. Decreasing size of distal peripancreatic pseudocysts. Bibasilar enhancing consolidation most consistent with atelectasis, less likely pneumonia with similar small LEFT pleural effusion. Severe hepatic steatosis. Electronically Signed   By: Awilda Metro M.D.   On: 02/16/2015 20:46      Assessment/Plan:  INTERVAL HISTORY:  Temp to 101.2 last night   Principal Problem:   Abdominal hematoma Active Problems:   Chronic diastolic heart failure (HCC)   Intractable abdominal pain   Edema   Leukocytosis   Splenic vein thrombosis   Intra-abdominal hematoma   Warfarin-induced coagulopathy (HCC)   Hypertension   Hypoalbuminemia due to protein-calorie malnutrition (HCC)   Alcoholic pancreatitis   Acute blood loss anemia    Kara [name] is a 29 y.o. female with  alcholic pancreatitis s/p splenic vein thrombosis on anticoagulation  admitted for abdominal pain and found to have multiple intrabdominal fluid collections c/w hemorrhage. She had been on very broad spectrum antibiotics and was fevering through them likely because her fevers were due to blood in her peritoneum rather than overt infection. Off antibiotics she had temp to 101.2  #1 Fevers: likely due to intrabdominal  bleed with irritation and fevers   If her condition changes dramatically to suggest infection I would repeat CT of her abdomen to see if there is anything resembling an abscess or infected hematoma apparent and ask IR to aspirate ths for culture and call us back. In the interim would observe off antibiotics  I will otherwise sign off for now.   LOS: 9 days   Acey Lav 02/18/2015, 3:38 PM   Addendum : I noticed that while she was HIV negative in 2015 she was tested for [facility] and gonorrhea in 2016 WITHOUT an HIV which is WRONG. Whenever one suspects ANY STD , ALL should be tested in particular HIV. Whatever made somone think she could have GC and chlamydia should have made them think she might have HIV, which unlike GC, chlamydia or syphilis is not curable but requires lifelong adherence. I will recheck her HIV.

## 2015-02-18 NOTE — Discharge Summary (Signed)
Physician Discharge Summary  Kara [name] ZOX:[id number] DOB: 1985/06/08 DOA: 01/29/2015  PCP:  Duane Lope, MD  Admit date: 01/29/2015 Discharge date: 02/09/2015  Time spent: 45 minutes  Recommendations for Outpatient Follow-up:  1.PCP Dr.Ross in  1week 1. Newly started on Warfarin for Splenic vein thrombosis, needs INR check Thursday 10/25 2. On lasix for 3-4 days, has iatrogenic edema from fluid resuscitation, check Bmet at FU  Discharge Diagnoses:  Principal Problem:   Acute pancreatitis Active Problems:   Alcohol abuse   Elevated LFTs   Chronic diastolic heart failure (HCC)   Edema   Sinus tachycardia (HCC)   Leukocytosis   AKI (acute kidney injury) (HCC)   Splenic vein thrombosis   Pancreatitis, acute   Discharge Condition:stable  Diet recommendation:low fat diet  Filed Weights   02/05/15 0557 02/07/15 0525 02/08/15 0449  Weight: 104.877 kg (231 lb 3.4 oz) 107 kg (235 lb 14.3 oz) 104.2 kg (229 lb 11.5 oz)    History of present illness:  Kara [name] is an 29 y.o. female past medical history of hypertension social drinker which usually drinks over the weekend hard liquor regularly that presented to the ER, she started having left flank pain 24 hours prior to admission, progressively been getting worse she had not been able to tolerate anything by mouth, She relates she has been vomiting since then not able to keep anything down.  In the ED: She is found to have a lipase of greater than 1000 mildly elevated LFTs, elevated bilirubin of 2.6 a white count of 21 with a left shift CT scan of the abdomen and pelvis was done that showed acute pancreatitis extensive peripancreatic fluid extending into the left upper quadrant and down the left pericolic   Hospital Course:  1. Severe Alcoholic pancreatitis -complicated by Pseudocysts and Splenic vein thrombosis -improved slowly, treated with bowel rest, supportive care, now tolerating Low fat diet -was also on IV imipenem for  fevers associated with Pancreatitis, Stopped Imipenem on 10/21 and changed to PO flagyl and Cefuroxime, did not use CIpro due to Warfarin use, continue Abx for few more days post discharge, afebrile now  2. Acute alcoholic hepatitis -improving, counseled -LFTs improving, DF <32  3. Fever -related to pancreatitis, improved -was on Imipenem, changed to Po Abx-as above,  Tmax was 101 on 10/17 -afebrile since  4. Asthma -Remains stable. No wheezing  5. HTN -BP stable  6. ETOH abuse  -no withdrawals noted, Thiamine, counseled  7. Splenic Vein Thrombosis -Portal vein Thrombus was noted on recent CT abd and FU MRA on 10/17 was notable for splenic vein thrombus extending into portal vein and proximal SMV -was on heparin gtt initially -anticoagulation is controversial in this setting -d/w GI Dr.Edwards, he recommended PO anticoagulation for few months and re-assess due to risk of acute portal vein thrombosis -discussed risks and benefits with patient, started Warfarin 10/19, bridged with lovenox -INR therapeutic and completed bridge today,, INR check in 2days recommended  8. Iatrogenic edema -started on Lasix, needs this for few more days, please check Bmet at FU   Consultations:  Gi   Discharge Exam: Filed Vitals:   02/09/15 0557  BP: 112/76  Pulse: 92  Temp: 98 F (36.7 C)  Resp: 20    General:AAOx3 Cardiovascular: S1S2/RRR Respiratory: CTAB  Discharge Instructions    Discharge Medication List as of 02/09/2015 10:56 AM    START taking these medications   Details  cefUROXime (CEFTIN) 500 MG tablet Take 1 tablet (500 mg total)  by mouth 2 (two) times daily with a meal. For 4days, Starting 02/09/2015, Until Discontinued, Print    furosemide (LASIX) 20 MG tablet Take 1 tablet (20 mg total) by mouth 2 (two) times daily. For 3-4days, for leg swelling, Starting 02/09/2015, Until Discontinued, Print    metroNIDAZOLE (FLAGYL) 500 MG tablet Take 1 tablet  (500 mg total) by mouth every 8 (eight) hours. For 4days, Starting 02/09/2015, Until Discontinued, Print    ondansetron (ZOFRAN) 4 MG tablet Take 1 tablet (4 mg total) by mouth every 8 (eight) hours as needed for nausea or vomiting., Starting 02/09/2015, Until Discontinued, Print    oxyCODONE (OXY IR/ROXICODONE) 5 MG immediate release tablet Take 1 tablet (5 mg total) by mouth every 6 (six) hours as needed for severe pain., Starting 02/09/2015, Until Discontinued, Print    potassium chloride SA (K-DUR,KLOR-CON) 20 MEQ tablet Take 1 tablet (20 mEq total) by mouth daily. Take while on Lasix, Starting 02/09/2015, Until Discontinued, Normal    warfarin (COUMADIN) 5 MG tablet Take 1 tablet (5 mg total) by mouth daily. Dose may be adjusted based on INR check Wednesday, Starting 02/09/2015, Until Discontinued, Print      CONTINUE these medications which have NOT CHANGED   Details  albuterol (PROVENTIL HFA;VENTOLIN HFA) 108 (90 BASE) MCG/ACT inhaler Inhale 2 puffs into the lungs every 6 (six) hours as needed for wheezing or shortness of breath (wheezing and sob). , Until Discontinued, Historical Med    fexofenadine (ALLEGRA) 180 MG tablet Take 180 mg by mouth daily as needed for allergies (allergies). , Until Discontinued, Historical Med    gabapentin (NEURONTIN) 300 MG capsule One in am, one in evening and two po at night, Normal    metoprolol succinate (TOPROL-XL) 25 MG 24 hr tablet Take 1 tablet (25 mg total) by mouth daily., Starting 06/17/2014, Until Discontinued, Normal    mometasone-formoterol (DULERA) 200-5 MCG/ACT AERO Inhale 2 puffs into the lungs 2 (two) times daily., Until Discontinued, Historical Med    montelukast (SINGULAIR) 10 MG tablet Take 10 mg by mouth daily. , Until Discontinued, Historical Med    Multiple Vitamin (MULTIVITAMIN WITH MINERALS) TABS tablet Take 1 tablet by mouth daily., Starting 07/19/2014, Until Discontinued, Print    polyethylene glycol (MIRALAX / GLYCOLAX)  packet Take 17 g by mouth daily as needed., Starting 07/19/2014, Until Discontinued, Print      STOP taking these medications     ALPRAZolam (XANAX) 0.25 MG tablet      pantoprazole (PROTONIX) 40 MG tablet      sucralfate (CARAFATE) 1 GM/10ML suspension      traMADol (ULTRAM) 50 MG tablet        Allergies  Allergen Reactions  . Peanuts [Peanut Oil] Itching    Sensitivity    Follow-up Information    Follow up with  Duane Lope, MD In 1 week.   Specialty:  Family Medicine   Why:  By Drucie Opitz information:   [address] [place] [address] [phone]       Follow up with Lab On 02/12/2015.   Why:  Need INR check, Bmet        The results of significant diagnostics from this hospitalization (including imaging, microbiology, ancillary and laboratory) are listed below for reference.    Significant Diagnostic Studies: Dg Chest 1 View  02/10/2015  CLINICAL DATA:  Central line placement EXAM: CHEST 1 VIEW COMPARISON:  02/09/2015 FINDINGS: Left IJ venous catheter terminates in the mid SVC. Patchy right lower  lobe opacity, likely atelectasis. Cardiomegaly with pulmonary vascular congestion and possible mild interstitial edema edema. Moderate layering left pleural effusion, mildly increased. Small right pleural effusion. IMPRESSION: Left IJ venous catheter terminates in the mid SVC. Cardiomegaly pulmonary vascular congestion and possible mild interstitial edema. Moderate layering left pleural effusion, mildly increased. Small right pleural effusion. Patchy right lower lobe opacity, likely atelectasis. Electronically Signed   By: Charline BillsSriyesh  Krishnan M.D.   On: 02/10/2015 11:54   Dg Chest 2 View  02/16/2015  CLINICAL DATA:  Shortness of breath EXAM: CHEST  2 VIEW COMPARISON:  02/10/2015 FINDINGS: There is cardiac enlargement. Bilateral pleural effusions are identified left greater than right. No interstitial edema or airspace consolidation. IMPRESSION: 1.  Cardiac enlargement and bilateral pleural effusions. Electronically Signed   By: Signa Kellaylor  Stroud M.D.   On: 02/16/2015 12:59   Mr Abdomen W Wo Contrast  02/03/2015  CLINICAL DATA:  Portal vein thrombosis questioned on recent CT scan. Pancreatitis. EXAM: MRI ABDOMEN WITHOUT AND WITH CONTRAST TECHNIQUE: Multiplanar multisequence MR imaging of the abdomen was performed both before and after the administration of intravenous contrast. CONTRAST:  20mL MULTIHANCE GADOBENATE DIMEGLUMINE 529 MG/ML IV SOLN COMPARISON:  02/01/2015 FINDINGS: Lower chest: Moderate left and small right pleural effusions with associated passive atelectasis. Hepatobiliary: Diffuse hepatic steatosis with extensive dropout of signal in the hepatic parenchyma on out of phase images compared in phase images. Pancreas: Suspected pancreatic pseudocyst tracking along the left lateral border of the stomach, one measuring about 4.5 cm diameter and the other measuring about 3.5 cm diameter, with adjoining peripancreatic fluid collection on image 17 series 8. Additional peripancreatic acute fluid collection extending caudad along the left anterior retroperitoneal margin. There is increased T2 signal in the pancreas but I do not observe a pancreatic mass. Spleen: Unremarkable Adrenals/Urinary Tract: Unremarkable Stomach/Bowel: Aside from the suspected pseudocysts noted above, no significant abnormality. Vascular/Lymphatic: Thrombosed splenic vein, new from 01/29/2015 but stable compared to 02/01/2015. The thrombosis of the splenic vein extends to the confluence of the superior mesenteric vein in the splenic vein, with a small amount of nonocclusive clot extending down into the SMV and into the portal vein confluence. Other: No supplemental non-categorized findings. Musculoskeletal: Unremarkable IMPRESSION: 1. Splenic vein thrombosis/occlusion. Thrombus in the splenic vein extends into the portal vein confluence, with small amounts of nonocclusive thrombus  extending in the portal vein and proximal SMV. 2. Diffuse hepatic steatosis. 3. Pancreatitis with acute peripancreatic fluid collections and probable early pseudocyst formation along the left lateral border of the stomach. Fluid collection with somewhat irregular enhancing margins tracks around the anterior perirenal fascia all margin ; given the enhancing margins, early abscess formation is not readily excluded. I do not see pancreatic parenchymal necrosis or an abscess involving the pancreatic parenchyma itself. Electronically Signed   By: Gaylyn RongWalter  Liebkemann M.D.   On: 02/03/2015 15:20   [place] Transvaginal Non-ob  02/09/2015  ADDENDUM REPORT: 02/09/2015 23:43 ADDENDUM: The Doppler findings should read: Pulsed Doppler evaluation of the right ovary demonstrates normal low-resistance arterial and venous waveforms. The left ovary is not visualized on this study. Subsequent CT demonstrates a grossly unremarkable appearance to both ovaries. Electronically Signed   By: Roanna RaiderJeffery  Chang M.D.   On: 02/09/2015 23:43  02/09/2015  CLINICAL DATA:  Acute onset of pelvic pain.  Initial encounter. EXAM: TRANSABDOMINAL AND TRANSVAGINAL ULTRASOUND OF PELVIS DOPPLER ULTRASOUND OF OVARIES TECHNIQUE: Both transabdominal and transvaginal ultrasound examinations of the pelvis were performed. Transabdominal technique was performed for global imaging of the  pelvis including uterus, ovaries, adnexal regions, and pelvic cul-de-sac. It was necessary to proceed with endovaginal exam following the transabdominal exam to visualize the uterus and ovaries in greater detail. Color and duplex Doppler ultrasound was utilized to evaluate blood flow to the ovaries. COMPARISON:  CT of the abdomen and pelvis from 02/01/2015 FINDINGS: Uterus Measurements: 4.6 x 2.0 x 2.6 cm. No fibroids or other mass visualized. Endometrium Thickness: 0.2 cm.  No focal abnormality visualized. Right ovary Measurements: 2.9 x 2.2 x 2.7 cm. Normal appearance/no adnexal  mass. Left ovary Not visualized. Pulsed Doppler evaluation of both ovaries demonstrates normal low-resistance arterial and venous waveforms. Other findings A small amount of free fluid is seen within the pelvic cul-de-sac. IMPRESSION: Unremarkable pelvic ultrasound. No evidence for ovarian torsion. The left ovary is not visualized on this study. Electronically Signed: By: Roanna Raider M.D. On: 02/09/2015 23:08   US Pelvis Complete  02/09/2015  ADDENDUM REPORT: 02/09/2015 23:43 ADDENDUM: The Doppler findings should read: Pulsed Doppler evaluation of the right ovary demonstrates normal low-resistance arterial and venous waveforms. The left ovary is not visualized on this study. Subsequent CT demonstrates a grossly unremarkable appearance to both ovaries. Electronically Signed   By: Roanna Raider M.D.   On: 02/09/2015 23:43  02/09/2015  CLINICAL DATA:  Acute onset of pelvic pain.  Initial encounter. EXAM: TRANSABDOMINAL AND TRANSVAGINAL ULTRASOUND OF PELVIS DOPPLER ULTRASOUND OF OVARIES TECHNIQUE: Both transabdominal and transvaginal ultrasound examinations of the pelvis were performed. Transabdominal technique was performed for global imaging of the pelvis including uterus, ovaries, adnexal regions, and pelvic cul-de-sac. It was necessary to proceed with endovaginal exam following the transabdominal exam to visualize the uterus and ovaries in greater detail. Color and duplex Doppler ultrasound was utilized to evaluate blood flow to the ovaries. COMPARISON:  CT of the abdomen and pelvis from 02/01/2015 FINDINGS: Uterus Measurements: 4.6 x 2.0 x 2.6 cm. No fibroids or other mass visualized. Endometrium Thickness: 0.2 cm.  No focal abnormality visualized. Right ovary Measurements: 2.9 x 2.2 x 2.7 cm. Normal appearance/no adnexal mass. Left ovary Not visualized. Pulsed Doppler evaluation of both ovaries demonstrates normal low-resistance arterial and venous waveforms. Other findings A small amount of free fluid  is seen within the pelvic cul-de-sac. IMPRESSION: Unremarkable pelvic ultrasound. No evidence for ovarian torsion. The left ovary is not visualized on this study. Electronically Signed: By: Roanna Raider M.D. On: 02/09/2015 23:08   Ct Abdomen Pelvis W Contrast  02/16/2015  CLINICAL DATA:  Follow-up pancreatitis with hematoma formation. Status post embolization of inferior mesenteric artery bleeds. EXAM: CT ABDOMEN AND PELVIS WITH CONTRAST TECHNIQUE: Multidetector CT imaging of the abdomen and pelvis was performed using the standard protocol following bolus administration of intravenous contrast. CONTRAST:  OMNIPAQUE IOHEXOL 300 MG/ML  SOLN COMPARISON:  CT abdomen and pelvis February 09, 2015 FINDINGS: LUNG BASES: Small, similar LEFT pleural effusion. Patchy consolidation in the lower lobes bilaterally with air bronchograms, small lingular consolidation. Heart size is normal, no pericardial effusions. SOLID ORGANS: The liver is diffusely hypodense compatible with steatosis, otherwise unremarkable. Contracted gallbladder. Adrenal glands and spleen are normal. 11 x 9 x 15.2 cm LEFT abdomen and pelvis heterogeneously dense hematoma was 7.4 x 10 x 13.7 cm. No central contrast extravasation, marginal metallic densities consistent with coil embolic material. Free fluid about the distal pancreatic body and tail, with faint hypo enhancement. Attenuated splenic vein, with occluded on recent angiogram. Pseudocyst contiguous with the stomach was 3.9 cm, now 3.3 cm. Retroperitoneal small pseudocyst  was 3.6 cm, now 2.6 cm Spleen is otherwise unremarkable. Gallbladder, pancreas and adrenal glands are unremarkable. GASTROINTESTINAL TRACT: The stomach, small and large bowel are normal in course and caliber without inflammatory changes. Contrast in the colon. The appendix is not discretely identified, however there are no inflammatory changes in the right lower quadrant. KIDNEYS/ URINARY TRACT: Kidneys are orthotopic,  demonstrating symmetric enhancement. No nephrolithiasis, hydronephrosis or solid renal masses. The unopacified ureters are normal in course and caliber. Delayed imaging through the kidneys demonstrates symmetric prompt contrast excretion within the proximal urinary collecting system. Urinary bladder is partially distended and unremarkable. PERITONEUM/RETROPERITONEUM: Aortoiliac vessels are normal in course and caliber. Small central mesenteric lymph nodes. No lymphadenopathy by CT size criteria. Internal reproductive organs are unremarkable. Small amount of ascites, no intraperitoneal free air. SOFT TISSUE/OSSEOUS STRUCTURES: Mild anasarca. Scattered chronic Schmorl's nodes without destructive bony lesions. IMPRESSION: Enlarging 11 x 9 x 15.2 cm LEFT abdominal pelvic hematoma was 7.4 x 10 x 13.7 cm. No active contrast extravasation on this non angiographic phase though, continued bleeding is a consideration. Improving acute pancreatitis, with faint hypo enhancement in the tail of the pancreas concerning for necrosis. Decreasing size of distal peripancreatic pseudocysts. Bibasilar enhancing consolidation most consistent with atelectasis, less likely pneumonia with similar small LEFT pleural effusion. Severe hepatic steatosis. Electronically Signed   By: Awilda Metro M.D.   On: 02/16/2015 20:46   Ct Abdomen Pelvis W Contrast  02/09/2015  CLINICAL DATA:  Acute onset of pelvic and lower abdominal pain. Lower back pain. Initial encounter. EXAM: CT ABDOMEN AND PELVIS WITH CONTRAST TECHNIQUE: Multidetector CT imaging of the abdomen and pelvis was performed using the standard protocol following bolus administration of intravenous contrast. CONTRAST:  OMNIPAQUE IOHEXOL 300 MG/ML  SOLN COMPARISON:  CT of the abdomen and pelvis performed 02/01/2015, and pelvic ultrasound performed earlier today at 10:21 p.m. FINDINGS: A small left pleural effusion is noted, with bibasilar atelectasis. Since the prior study,  there has been interval development of a large heterogeneous 13.7 x 10.0 x 7.4 cm collection of fluid at the left mid abdomen and left lower quadrant, tracking inferiorly from the pancreatic tail, with multiple small foci of high attenuation. It is noted anterior to the left kidney and left psoas musculature. This is highly suspicious for a new intra-abdominal hematoma, with multiple small foci of contrast extravasation, reflecting active bleeding. This is a very unusual appearance for hemorrhagic pancreatitis. New retroperitoneal dense soft tissue inflammation is also noted about the level of the aortic bifurcation, measuring 7.5 x 5.2 x 2.9 cm, concerning for extension of soft tissue inflammation into the inferior retroperitoneum. New evolving pseudocysts are noted about the pancreatic tail, measuring up to 5.2 cm in size. One of these fluid collections extends adjacent to small bowel loops. Previously noted soft tissue inflammation about the head and body of the pancreas has improved. There is no evidence of devascularization. There is diffuse fatty infiltration within the liver. The spleen is unremarkable in appearance, though inflammatory fluid is seen tracking about the spleen. The gallbladder is grossly unremarkable. The adrenal glands are grossly unremarkable, though the left adrenal gland is difficult to fully assess due to surrounding fluid. The kidneys are unremarkable in appearance. There is no evidence of hydronephrosis. No renal or ureteral stones are seen. No perinephric stranding is appreciated. Minimal left-sided perinephric fluid likely reflects the overlying hematoma. The small bowel is unremarkable in appearance. A vague evolving pseudocyst is noted at the fundus of the stomach, similar  in appearance to the prior study, measuring approximately 4.0 cm. The stomach is otherwise grossly unremarkable in appearance. The appendix is normal in caliber, without evidence of appendicitis. The colon is  decompressed and not well assessed, but appears grossly unremarkable. The bladder is decompressed. Somewhat complex fluid is noted tracking within the pelvis, likely extending from the patient's pancreatitis. The uterus is unremarkable in appearance. The ovaries are grossly symmetric. No suspicious adnexal masses are seen. No inguinal lymphadenopathy is seen. No acute osseous abnormalities are identified. IMPRESSION: 1. Interval development of a large heterogeneous 13.7 x 10.0 x 7.4 cm collection of fluid at the left mid abdomen and left lower quadrant, tracking inferiorly from the pancreatic tail anterior to the left kidney and left psoas musculature. Multiple small foci of high attenuation noted within the collection. This is highly suspicious for a new intra-abdominal hematoma, with multiple small foci of active bleeding. This may reflect pancreatic enzymes eroding into tiny surrounding vessels, given that the patient is on anticoagulation. This is a very unusual appearance for hemorrhagic pancreatitis. 2. Additional new retroperitoneal dense soft tissue inflammation noted about the level of the aortic bifurcation, measuring 7.5 x 5.2 x 2.9 cm, concerning for extension of soft tissue inflammation into the inferior retroperitoneum. 3. New evolving pseudocysts about the pancreatic tail, measuring up to 5.2 cm in size. One of these fluid collections extends adjacent to small bowel loops, though the small bowel is unremarkable in appearance. Soft tissue inflammation about the head and body of the pancreas has improved. No evidence of devascularization. 4. 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 Complex fluid tracks about the spleen. 5. Somewhat complex fluid noted tracking within the pelvis, likely extending from the patient's pancreatitis. 6. Small left pleural effusion noted, with bibasilar atelectasis. 7. Diffuse fatty infiltration within  the liver. Critical Value/emergent results were called by telephone at the time of interpretation on 02/09/2015 at 11:35 pm to Dr. Glynn Octave, who verbally acknowledged these results. Electronically Signed   By: Roanna Raider M.D.   On: 02/09/2015 23:57   Ct Abdomen Pelvis W Contrast  02/01/2015  CLINICAL DATA:  Follow-up pancreatitis EXAM: CT ABDOMEN AND PELVIS WITH CONTRAST TECHNIQUE: Multidetector CT imaging of the abdomen and pelvis was performed using the standard protocol following bolus administration of intravenous contrast. CONTRAST:  50mL OMNIPAQUE IOHEXOL 300 MG/ML SOLN, OMNIPAQUE IOHEXOL 300 MG/ML SOLN COMPARISON:  Ten/ 12/16 FINDINGS: There is small left pleural effusion. Trace right pleural effusion. Bilateral lower lobe posterior atelectasis. Again noted significant fatty infiltration of the liver. Heart size within normal limits. Again noted peripancreatic fluid and significant stranding of peripancreatic fat consistent with acute pancreatitis. There is increasing peripancreatic fluid from prior exam. Small amount of perisplenic ascites. There is moderate ascites in left paracolic gutter. Small loculated fluid is noted adjacent to proximal gastric wall suspicious for loculated ascites or pancreatic pseudocyst. No definite pancreatic abscess is identified. The SMA and celiac trunk are patent. In axial image 35 there is filling defect in portal vein highly suspicious for nonocclusive thrombus. Similar appearance in axial image 36. The superior mesenteric vein is patent. No calcified gallstones are noted within gallbladder. No focal splenic mass. Kidneys are symmetrical in size. No hydronephrosis or hydroureter. Small amount of mesenteric ascites is noted adjacent to distal small bowel loops. There is moderate pelvic ascites. The uterus and ovaries are unremarkable. No distal colonic obstruction. Urinary bladder is unremarkable. There  is no small bowel obstruction. No pericecal  inflammation. The terminal ileum is unremarkable. IMPRESSION: 1. There is small bilateral pleural effusion left greater than right with bilateral lower lobe posterior atelectasis. Significant fatty infiltration of the liver again noted. Again noted significant peripancreatic stranding and there is increase in peripancreatic fluid. Findings are consistent with acute pancreatitis. 2. Small perisplenic ascites. Moderate ascites noted in left paracolic gutter. Moderate pelvic ascites. Small ascites in lower mesentery adjacent to small bowel loops. 3. There is loculated fluid collection adjacent to proximal stomach just anterior to the spleen measures about 3.8 cm. This is suspicious for loculated ascites or pancreatic pseudocyst. 4. Question nonocclusive thrombosis in portal vein axial image 35 and 36. The superior mesenteric vein is patent. 5. No hydronephrosis or hydroureter. 6. No small bowel obstruction. Electronically Signed   By: Natasha Mead M.D.   On: 02/01/2015 12:48   Ir Angiogram Visceral Selective  02/10/2015  CLINICAL DATA:  Acute pancreatitis with retroperitoneal fluid collections, splenic vein thrombosis, on anticoagulation. Recent CT demonstrates new left retroperitoneal hematoma with multifocal regions of active extravasation. Significant blood loss requiring transfusion. EXAM: SELECTIVE VISCERAL ARTERIOGRAPHY; IR EMBO ART VEN HEMORR LYMPH EXTRAV INC GUIDE ROADMAPPING; ADDITIONAL ARTERIOGRAPHY; IR ULTRASOUND GUIDANCE VASC ACCESS RIGHT MEDICATIONS: Intravenous Fentanyl and Versed were administered as conscious sedation during continuous cardiorespiratory monitoring by the radiology RN, with a total moderate sedation time of 85 minutes. CONTRAST:  OMNIPAQUE IOHEXOL 300 MG/ML  SOLN FLUOROSCOPY TIME:  3.5 minutes, 48 51  uGym2 DAP PROCEDURE: The procedure, risks (including but not limited to bleeding, infection, organ damage ), benefits, and alternatives were explained to the patient. Questions  regarding the procedure were encouraged and answered. The patient understands and consents to the procedure. Site was marked, prepped with Betadine, draped in usual sterile fashion, infiltrated locally with 1% lidocaine. Under ultrasound, right common femoral artery localized. Under real-time ultrasound guidance, the vessel was accessed with a 21-gauge micropuncture needle, exchanged over a 018 guidewire for a transitional dilator, through which a 035 guidewire was advanced. A 5 French vascular sheath was placed. A 5 French C2 catheter advanced and used to selectively catheterize the splenic artery for selective arteriography. Catheter was then pulled back into the celiac axis for additional selective arteriography. Catheter then used to selectively catheterize the superior mesenteric artery for selective arteriography in multiple projections. The catheter was exchanged over the guidewire for a RIM catheter for additional SMA arteriography. The RIM was exchanged back for the C2, used to selectively catheterize the inferior mesenteric artery for selective arteriography in multiple projections. A renegade micro catheter was then coaxially advanced with the aid of a Fathom guidewire and used to selectively catheterize 3 different third order branches of the IMA for additional supra selective arteriography. Active extravasation at the level of the proximal descending colon was treated with 2 mm coil embolization. Follow-up arteriography was performed. The micro catheter was then redirected and coil embolization of a second site of active extravasation at the level of the distal descending colon was performed. Follow-up arteriography was performed. The micro catheter and C2 were then withdrawn. After confirmatory femoral arteriogram, the sheath was removed and hemostasis achieved with the aid of Exoseal device. The patient tolerated the procedure well. COMPLICATIONS: None immediate FINDINGS: Selective splenic and celiac  arteriography demonstrated no active extravasation, pseudoaneurysm, or other unexpected arterial finding. 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 Selective superior mesenteric arteriography demonstrated no active extravasation, pseudoaneurysm, or other  vascular abnormality. The proper hepatic artery originates from the gastroduodenal artery, an anatomic variant. Venous phase confirms patency of superior mesenteric vein and portal vein with antegrade flow. Inferior mesenteric arteriography demonstrates several extraluminal foci of active extravasation in the left retroperitoneum, corresponding to findings on recent CT. Subselective micro catheter arteriography localized the segmental supply to an area of active extravasation at the level of the proximal descending colon. This area was coil embolized, and follow-up arteriography demonstrated no further active extravasation. Sub selective micro catheter arteriography at the level of the sigmoid colon failed to localize the branches supplying the smaller site of active extravasation. Subselective micro catheter arteriography at the level of the distal descending colon localized a site of active extravasation, and once the supplying vessels were identified, septal sub selective coil Embolization was performed. Follow-up arteriography demonstrated no further extravasation in this region. IMPRESSION: 1. At least 3 separate foci of active extravasation in the left lower retroperitoneum from small branches of the inferior mesenteric artery circulation. 2. Technically successful coil embolization of 2 sites of active extravasation. Sub selective supply to the third site at the level of the sigmoid colon could not be localized. Critical Value/emergent results were discussed with Dr. Carolynne Edouard from surgery, who verbally acknowledged these results. 3. Splenic vein occlusion, as previously documented. Electronically  Signed   By: Corlis Leak M.D.   On: 02/10/2015 16:23   Ir Angiogram Visceral Selective  02/10/2015  CLINICAL DATA:  Acute pancreatitis with retroperitoneal fluid collections, splenic vein thrombosis, on anticoagulation. Recent CT demonstrates new left retroperitoneal hematoma with multifocal regions of active extravasation. Significant blood loss requiring transfusion. EXAM: SELECTIVE VISCERAL ARTERIOGRAPHY; IR EMBO ART VEN HEMORR LYMPH EXTRAV INC GUIDE ROADMAPPING; ADDITIONAL ARTERIOGRAPHY; IR ULTRASOUND GUIDANCE VASC ACCESS RIGHT MEDICATIONS: Intravenous Fentanyl and Versed were administered as conscious sedation during continuous cardiorespiratory monitoring by the radiology RN, with a total moderate sedation time of 85 minutes. CONTRAST:  OMNIPAQUE IOHEXOL 300 MG/ML  SOLN FLUOROSCOPY TIME:  3.5 minutes, 48 51  uGym2 DAP PROCEDURE: The procedure, risks (including but not limited to bleeding, infection, organ damage ), benefits, and alternatives were explained to the patient. Questions regarding the procedure were encouraged and answered. The patient understands and consents to the procedure. Site was marked, prepped with Betadine, draped in usual sterile fashion, infiltrated locally with 1% lidocaine. Under ultrasound, right common femoral artery localized. Under real-time ultrasound guidance, the vessel was accessed with a 21-gauge micropuncture needle, exchanged over a 018 guidewire for a transitional dilator, through which a 035 guidewire was advanced. A 5 French vascular sheath was placed. A 5 French C2 catheter advanced and used to selectively catheterize the splenic artery for selective arteriography. Catheter was then pulled back into the celiac axis for additional selective arteriography. Catheter then used to selectively catheterize the superior mesenteric artery for selective arteriography in multiple projections. The catheter was exchanged over the guidewire for a RIM catheter for additional  SMA arteriography. The RIM was exchanged back for the C2, used to selectively catheterize the inferior mesenteric artery for selective arteriography in multiple projections. A renegade micro catheter was then coaxially advanced with the aid of a Fathom guidewire and used to selectively catheterize 3 different third order branches of the IMA for additional supra selective arteriography. Active extravasation at the level of the proximal descending colon was treated with 2 mm coil embolization. Follow-up arteriography was performed. The micro catheter was then redirected and coil embolization of a second site of active extravasation at the  level of the distal descending colon was performed. Follow-up arteriography was performed. The micro catheter and C2 were then withdrawn. After confirmatory femoral arteriogram, the sheath was removed and hemostasis achieved with the aid of Exoseal device. The patient tolerated the procedure well. COMPLICATIONS: None immediate FINDINGS: Selective splenic and celiac arteriography demonstrated no active extravasation, pseudoaneurysm, or other unexpected arterial finding. 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 Selective superior mesenteric arteriography demonstrated no active extravasation, pseudoaneurysm, or other vascular abnormality. The proper hepatic artery originates from the gastroduodenal artery, an anatomic variant. Venous phase confirms patency of superior mesenteric vein and portal vein with antegrade flow. Inferior mesenteric arteriography demonstrates several extraluminal foci of active extravasation in the left retroperitoneum, corresponding to findings on recent CT. Subselective micro catheter arteriography localized the segmental supply to an area of active extravasation at the level of the proximal descending colon. This area was coil embolized, and follow-up arteriography demonstrated no further active  extravasation. Sub selective micro catheter arteriography at the level of the sigmoid colon failed to localize the branches supplying the smaller site of active extravasation. Subselective micro catheter arteriography at the level of the distal descending colon localized a site of active extravasation, and once the supplying vessels were identified, septal sub selective coil Embolization was performed. Follow-up arteriography demonstrated no further extravasation in this region. IMPRESSION: 1. At least 3 separate foci of active extravasation in the left lower retroperitoneum from small branches of the inferior mesenteric artery circulation. 2. Technically successful coil embolization of 2 sites of active extravasation. Sub selective supply to the third site at the level of the sigmoid colon could not be localized. Critical Value/emergent results were discussed with Dr. Carolynne Edouard from surgery, who verbally acknowledged these results. 3. Splenic vein occlusion, as previously documented. Electronically Signed   By: Corlis Leak M.D.   On: 02/10/2015 16:23   Ir Angiogram Visceral Selective  02/10/2015  CLINICAL DATA:  Acute pancreatitis with retroperitoneal fluid collections, splenic vein thrombosis, on anticoagulation. Recent CT demonstrates new left retroperitoneal hematoma with multifocal regions of active extravasation. Significant blood loss requiring transfusion. EXAM: SELECTIVE VISCERAL ARTERIOGRAPHY; IR EMBO ART VEN HEMORR LYMPH EXTRAV INC GUIDE ROADMAPPING; ADDITIONAL ARTERIOGRAPHY; IR ULTRASOUND GUIDANCE VASC ACCESS RIGHT MEDICATIONS: Intravenous Fentanyl and Versed were administered as conscious sedation during continuous cardiorespiratory monitoring by the radiology RN, with a total moderate sedation time of 85 minutes. CONTRAST:  OMNIPAQUE IOHEXOL 300 MG/ML  SOLN FLUOROSCOPY TIME:  3.5 minutes, 48 51  uGym2 DAP PROCEDURE: The procedure, risks (including but not limited to bleeding, infection, organ  damage ), benefits, and alternatives were explained to the patient. Questions regarding the procedure were encouraged and answered. The patient understands and consents to the procedure. Site was marked, prepped with Betadine, draped in usual sterile fashion, infiltrated locally with 1% lidocaine. Under ultrasound, right common femoral artery localized. Under real-time ultrasound guidance, the vessel was accessed with a 21-gauge micropuncture needle, exchanged over a 018 guidewire for a transitional dilator, through which a 035 guidewire was advanced. A 5 French vascular sheath was placed. A 5 French C2 catheter advanced and used to selectively catheterize the splenic artery for selective arteriography. Catheter was then pulled back into the celiac axis for additional selective arteriography. Catheter then used to selectively catheterize the superior mesenteric artery for selective arteriography in multiple projections. The catheter was exchanged over the guidewire for a RIM catheter for additional SMA arteriography. The RIM was exchanged back for the C2, used  to selectively catheterize the inferior mesenteric artery for selective arteriography in multiple projections. A renegade micro catheter was then coaxially advanced with the aid of a Fathom guidewire and used to selectively catheterize 3 different third order branches of the IMA for additional supra selective arteriography. Active extravasation at the level of the proximal descending colon was treated with 2 mm coil embolization. Follow-up arteriography was performed. The micro catheter was then redirected and coil embolization of a second site of active extravasation at the level of the distal descending colon was performed. Follow-up arteriography was performed. The micro catheter and C2 were then withdrawn. After confirmatory femoral arteriogram, the sheath was removed and hemostasis achieved with the aid of Exoseal device. The patient tolerated the  procedure well. COMPLICATIONS: None immediate FINDINGS: Selective splenic and celiac arteriography demonstrated no active extravasation, pseudoaneurysm, or other unexpected arterial finding. 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 Selective superior mesenteric arteriography demonstrated no active extravasation, pseudoaneurysm, or other vascular abnormality. The proper hepatic artery originates from the gastroduodenal artery, an anatomic variant. Venous phase confirms patency of superior mesenteric vein and portal vein with antegrade flow. Inferior mesenteric arteriography demonstrates several extraluminal foci of active extravasation in the left retroperitoneum, corresponding to findings on recent CT. Subselective micro catheter arteriography localized the segmental supply to an area of active extravasation at the level of the proximal descending colon. This area was coil embolized, and follow-up arteriography demonstrated no further active extravasation. Sub selective micro catheter arteriography at the level of the sigmoid colon failed to localize the branches supplying the smaller site of active extravasation. Subselective micro catheter arteriography at the level of the distal descending colon localized a site of active extravasation, and once the supplying vessels were identified, septal sub selective coil Embolization was performed. Follow-up arteriography demonstrated no further extravasation in this region. IMPRESSION: 1. At least 3 separate foci of active extravasation in the left lower retroperitoneum from small branches of the inferior mesenteric artery circulation. 2. Technically successful coil embolization of 2 sites of active extravasation. Sub selective supply to the third site at the level of the sigmoid colon could not be localized. Critical Value/emergent results were discussed with Dr. Carolynne Edouard from surgery, who verbally acknowledged  these results. 3. Splenic vein occlusion, as previously documented. Electronically Signed   By: Corlis Leak M.D.   On: 02/10/2015 16:23   Ir Angiogram Selective Each Additional Vessel  02/10/2015  CLINICAL DATA:  Acute pancreatitis with retroperitoneal fluid collections, splenic vein thrombosis, on anticoagulation. Recent CT demonstrates new left retroperitoneal hematoma with multifocal regions of active extravasation. Significant blood loss requiring transfusion. EXAM: SELECTIVE VISCERAL ARTERIOGRAPHY; IR EMBO ART VEN HEMORR LYMPH EXTRAV INC GUIDE ROADMAPPING; ADDITIONAL ARTERIOGRAPHY; IR ULTRASOUND GUIDANCE VASC ACCESS RIGHT MEDICATIONS: Intravenous Fentanyl and Versed were administered as conscious sedation during continuous cardiorespiratory monitoring by the radiology RN, with a total moderate sedation time of 85 minutes. CONTRAST:  OMNIPAQUE IOHEXOL 300 MG/ML  SOLN FLUOROSCOPY TIME:  3.5 minutes, 48 51  uGym2 DAP PROCEDURE: The procedure, risks (including but not limited to bleeding, infection, organ damage ), benefits, and alternatives were explained to the patient. Questions regarding the procedure were encouraged and answered. The patient understands and consents to the procedure. Site was marked, prepped with Betadine, draped in usual sterile fashion, infiltrated locally with 1% lidocaine. Under ultrasound, right common femoral artery localized. Under real-time ultrasound guidance, the vessel was accessed with a 21-gauge micropuncture needle, exchanged over a 018 guidewire for  a transitional dilator, through which a 035 guidewire was advanced. A 5 French vascular sheath was placed. A 5 French C2 catheter advanced and used to selectively catheterize the splenic artery for selective arteriography. Catheter was then pulled back into the celiac axis for additional selective arteriography. Catheter then used to selectively catheterize the superior mesenteric artery for selective arteriography in  multiple projections. The catheter was exchanged over the guidewire for a RIM catheter for additional SMA arteriography. The RIM was exchanged back for the C2, used to selectively catheterize the inferior mesenteric artery for selective arteriography in multiple projections. A renegade micro catheter was then coaxially advanced with the aid of a Fathom guidewire and used to selectively catheterize 3 different third order branches of the IMA for additional supra selective arteriography. Active extravasation at the level of the proximal descending colon was treated with 2 mm coil embolization. Follow-up arteriography was performed. The micro catheter was then redirected and coil embolization of a second site of active extravasation at the level of the distal descending colon was performed. Follow-up arteriography was performed. The micro catheter and C2 were then withdrawn. After confirmatory femoral arteriogram, the sheath was removed and hemostasis achieved with the aid of Exoseal device. The patient tolerated the procedure well. COMPLICATIONS: None immediate FINDINGS: Selective splenic and celiac arteriography demonstrated no active extravasation, pseudoaneurysm, or other unexpected arterial finding. 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 Selective superior mesenteric arteriography demonstrated no active extravasation, pseudoaneurysm, or other vascular abnormality. The proper hepatic artery originates from the gastroduodenal artery, an anatomic variant. Venous phase confirms patency of superior mesenteric vein and portal vein with antegrade flow. Inferior mesenteric arteriography demonstrates several extraluminal foci of active extravasation in the left retroperitoneum, corresponding to findings on recent CT. Subselective micro catheter arteriography localized the segmental supply to an area of active extravasation at the level of the proximal  descending colon. This area was coil embolized, and follow-up arteriography demonstrated no further active extravasation. Sub selective micro catheter arteriography at the level of the sigmoid colon failed to localize the branches supplying the smaller site of active extravasation. Subselective micro catheter arteriography at the level of the distal descending colon localized a site of active extravasation, and once the supplying vessels were identified, septal sub selective coil Embolization was performed. Follow-up arteriography demonstrated no further extravasation in this region. IMPRESSION: 1. At least 3 separate foci of active extravasation in the left lower retroperitoneum from small branches of the inferior mesenteric artery circulation. 2. Technically successful coil embolization of 2 sites of active extravasation. Sub selective supply to the third site at the level of the sigmoid colon could not be localized. Critical Value/emergent results were discussed with Dr. Carolynne Edouard from surgery, who verbally acknowledged these results. 3. Splenic vein occlusion, as previously documented. Electronically Signed   By: Corlis Leak M.D.   On: 02/10/2015 16:23   Ir Angiogram Selective Each Additional Vessel  02/10/2015  CLINICAL DATA:  Acute pancreatitis with retroperitoneal fluid collections, splenic vein thrombosis, on anticoagulation. Recent CT demonstrates new left retroperitoneal hematoma with multifocal regions of active extravasation. Significant blood loss requiring transfusion. EXAM: SELECTIVE VISCERAL ARTERIOGRAPHY; IR EMBO ART VEN HEMORR LYMPH EXTRAV INC GUIDE ROADMAPPING; ADDITIONAL ARTERIOGRAPHY; IR ULTRASOUND GUIDANCE VASC ACCESS RIGHT MEDICATIONS: Intravenous Fentanyl and Versed were administered as conscious sedation during continuous cardiorespiratory monitoring by the radiology RN, with a total moderate sedation time of 85 minutes. CONTRAST:  OMNIPAQUE IOHEXOL 300 MG/ML  SOLN FLUOROSCOPY TIME:  3.5  minutes, 48 51  uGym2 DAP PROCEDURE: The procedure, risks (including but not limited to bleeding, infection, organ damage ), benefits, and alternatives were explained to the patient. Questions regarding the procedure were encouraged and answered. The patient understands and consents to the procedure. Site was marked, prepped with Betadine, draped in usual sterile fashion, infiltrated locally with 1% lidocaine. Under ultrasound, right common femoral artery localized. Under real-time ultrasound guidance, the vessel was accessed with a 21-gauge micropuncture needle, exchanged over a 018 guidewire for a transitional dilator, through which a 035 guidewire was advanced. A 5 French vascular sheath was placed. A 5 French C2 catheter advanced and used to selectively catheterize the splenic artery for selective arteriography. Catheter was then pulled back into the celiac axis for additional selective arteriography. Catheter then used to selectively catheterize the superior mesenteric artery for selective arteriography in multiple projections. The catheter was exchanged over the guidewire for a RIM catheter for additional SMA arteriography. The RIM was exchanged back for the C2, used to selectively catheterize the inferior mesenteric artery for selective arteriography in multiple projections. A renegade micro catheter was then coaxially advanced with the aid of a Fathom guidewire and used to selectively catheterize 3 different third order branches of the IMA for additional supra selective arteriography. Active extravasation at the level of the proximal descending colon was treated with 2 mm coil embolization. Follow-up arteriography was performed. The micro catheter was then redirected and coil embolization of a second site of active extravasation at the level of the distal descending colon was performed. Follow-up arteriography was performed. The micro catheter and C2 were then withdrawn. After confirmatory femoral  arteriogram, the sheath was removed and hemostasis achieved with the aid of Exoseal device. The patient tolerated the procedure well. COMPLICATIONS: None immediate FINDINGS: Selective splenic and celiac arteriography demonstrated no active extravasation, pseudoaneurysm, or other unexpected arterial finding. 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 Selective superior mesenteric arteriography demonstrated no active extravasation, pseudoaneurysm, or other vascular abnormality. The proper hepatic artery originates from the gastroduodenal artery, an anatomic variant. Venous phase confirms patency of superior mesenteric vein and portal vein with antegrade flow. Inferior mesenteric arteriography demonstrates several extraluminal foci of active extravasation in the left retroperitoneum, corresponding to findings on recent CT. Subselective micro catheter arteriography localized the segmental supply to an area of active extravasation at the level of the proximal descending colon. This area was coil embolized, and follow-up arteriography demonstrated no further active extravasation. Sub selective micro catheter arteriography at the level of the sigmoid colon failed to localize the branches supplying the smaller site of active extravasation. Subselective micro catheter arteriography at the level of the distal descending colon localized a site of active extravasation, and once the supplying vessels were identified, septal sub selective coil Embolization was performed. Follow-up arteriography demonstrated no further extravasation in this region. IMPRESSION: 1. At least 3 separate foci of active extravasation in the left lower retroperitoneum from small branches of the inferior mesenteric artery circulation. 2. Technically successful coil embolization of 2 sites of active extravasation. Sub selective supply to the third site at the level of the sigmoid colon could  not be localized. Critical Value/emergent results were discussed with Dr. Carolynne Edouard from surgery, who verbally acknowledged these results. 3. Splenic vein occlusion, as previously documented. Electronically Signed   By: Corlis Leak M.D.   On: 02/10/2015 16:23   Ir Angiogram Selective Each Additional Vessel  02/10/2015  CLINICAL DATA:  Acute pancreatitis  with retroperitoneal fluid collections, splenic vein thrombosis, on anticoagulation. Recent CT demonstrates new left retroperitoneal hematoma with multifocal regions of active extravasation. Significant blood loss requiring transfusion. EXAM: SELECTIVE VISCERAL ARTERIOGRAPHY; IR EMBO ART VEN HEMORR LYMPH EXTRAV INC GUIDE ROADMAPPING; ADDITIONAL ARTERIOGRAPHY; IR ULTRASOUND GUIDANCE VASC ACCESS RIGHT MEDICATIONS: Intravenous Fentanyl and Versed were administered as conscious sedation during continuous cardiorespiratory monitoring by the radiology RN, with a total moderate sedation time of 85 minutes. CONTRAST:  OMNIPAQUE IOHEXOL 300 MG/ML  SOLN FLUOROSCOPY TIME:  3.5 minutes, 48 51  uGym2 DAP PROCEDURE: The procedure, risks (including but not limited to bleeding, infection, organ damage ), benefits, and alternatives were explained to the patient. Questions regarding the procedure were encouraged and answered. The patient understands and consents to the procedure. Site was marked, prepped with Betadine, draped in usual sterile fashion, infiltrated locally with 1% lidocaine. Under ultrasound, right common femoral artery localized. Under real-time ultrasound guidance, the vessel was accessed with a 21-gauge micropuncture needle, exchanged over a 018 guidewire for a transitional dilator, through which a 035 guidewire was advanced. A 5 French vascular sheath was placed. A 5 French C2 catheter advanced and used to selectively catheterize the splenic artery for selective arteriography. Catheter was then pulled back into the celiac axis for additional selective  arteriography. Catheter then used to selectively catheterize the superior mesenteric artery for selective arteriography in multiple projections. The catheter was exchanged over the guidewire for a RIM catheter for additional SMA arteriography. The RIM was exchanged back for the C2, used to selectively catheterize the inferior mesenteric artery for selective arteriography in multiple projections. A renegade micro catheter was then coaxially advanced with the aid of a Fathom guidewire and used to selectively catheterize 3 different third order branches of the IMA for additional supra selective arteriography. Active extravasation at the level of the proximal descending colon was treated with 2 mm coil embolization. Follow-up arteriography was performed. The micro catheter was then redirected and coil embolization of a second site of active extravasation at the level of the distal descending colon was performed. Follow-up arteriography was performed. The micro catheter and C2 were then withdrawn. After confirmatory femoral arteriogram, the sheath was removed and hemostasis achieved with the aid of Exoseal device. The patient tolerated the procedure well. COMPLICATIONS: None immediate FINDINGS: Selective splenic and celiac arteriography demonstrated no active extravasation, pseudoaneurysm, or other unexpected arterial finding. 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 Selective superior mesenteric arteriography demonstrated no active extravasation, pseudoaneurysm, or other vascular abnormality. The proper hepatic artery originates from the gastroduodenal artery, an anatomic variant. Venous phase confirms patency of superior mesenteric vein and portal vein with antegrade flow. Inferior mesenteric arteriography demonstrates several extraluminal foci of active extravasation in the left retroperitoneum, corresponding to findings on recent CT. Subselective micro  catheter arteriography localized the segmental supply to an area of active extravasation at the level of the proximal descending colon. This area was coil embolized, and follow-up arteriography demonstrated no further active extravasation. Sub selective micro catheter arteriography at the level of the sigmoid colon failed to localize the branches supplying the smaller site of active extravasation. Subselective micro catheter arteriography at the level of the distal descending colon localized a site of active extravasation, and once the supplying vessels were identified, septal sub selective coil Embolization was performed. Follow-up arteriography demonstrated no further extravasation in this region. IMPRESSION: 1. At least 3 separate foci of active extravasation in the left lower retroperitoneum from small branches  of the inferior mesenteric artery circulation. 2. Technically successful coil embolization of 2 sites of active extravasation. Sub selective supply to the third site at the level of the sigmoid colon could not be localized. Critical Value/emergent results were discussed with Dr. Carolynne Edouard from surgery, who verbally acknowledged these results. 3. Splenic vein occlusion, as previously documented. Electronically Signed   By: Corlis Leak M.D.   On: 02/10/2015 16:23   [place] Art/ven Flow Abd Pelv Doppler  02/09/2015  ADDENDUM REPORT: 02/09/2015 23:43 ADDENDUM: The Doppler findings should read: Pulsed Doppler evaluation of the right ovary demonstrates normal low-resistance arterial and venous waveforms. The left ovary is not visualized on this study. Subsequent CT demonstrates a grossly unremarkable appearance to both ovaries. Electronically Signed   By: Roanna Raider M.D.   On: 02/09/2015 23:43  02/09/2015  CLINICAL DATA:  Acute onset of pelvic pain.  Initial encounter. EXAM: TRANSABDOMINAL AND TRANSVAGINAL ULTRASOUND OF PELVIS DOPPLER ULTRASOUND OF OVARIES TECHNIQUE: Both transabdominal and transvaginal  ultrasound examinations of the pelvis were performed. Transabdominal technique was performed for global imaging of the pelvis including uterus, ovaries, adnexal regions, and pelvic cul-de-sac. It was necessary to proceed with endovaginal exam following the transabdominal exam to visualize the uterus and ovaries in greater detail. Color and duplex Doppler ultrasound was utilized to evaluate blood flow to the ovaries. COMPARISON:  CT of the abdomen and pelvis from 02/01/2015 FINDINGS: Uterus Measurements: 4.6 x 2.0 x 2.6 cm. No fibroids or other mass visualized. Endometrium Thickness: 0.2 cm.  No focal abnormality visualized. Right ovary Measurements: 2.9 x 2.2 x 2.7 cm. Normal appearance/no adnexal mass. Left ovary Not visualized. Pulsed Doppler evaluation of both ovaries demonstrates normal low-resistance arterial and venous waveforms. Other findings A small amount of free fluid is seen within the pelvic cul-de-sac. IMPRESSION: Unremarkable pelvic ultrasound. No evidence for ovarian torsion. The left ovary is not visualized on this study. Electronically Signed: By: Roanna Raider M.D. On: 02/09/2015 23:08   Ir US Guide Vasc Access Right  02/10/2015  CLINICAL DATA:  Acute pancreatitis with retroperitoneal fluid collections, splenic vein thrombosis, on anticoagulation. Recent CT demonstrates new left retroperitoneal hematoma with multifocal regions of active extravasation. Significant blood loss requiring transfusion. EXAM: SELECTIVE VISCERAL ARTERIOGRAPHY; IR EMBO ART VEN HEMORR LYMPH EXTRAV INC GUIDE ROADMAPPING; ADDITIONAL ARTERIOGRAPHY; IR ULTRASOUND GUIDANCE VASC ACCESS RIGHT MEDICATIONS: Intravenous Fentanyl and Versed were administered as conscious sedation during continuous cardiorespiratory monitoring by the radiology RN, with a total moderate sedation time of 85 minutes. CONTRAST:  OMNIPAQUE IOHEXOL 300 MG/ML  SOLN FLUOROSCOPY TIME:  3.5 minutes, 48 51  uGym2 DAP PROCEDURE: The procedure, risks  (including but not limited to bleeding, infection, organ damage ), benefits, and alternatives were explained to the patient. Questions regarding the procedure were encouraged and answered. The patient understands and consents to the procedure. Site was marked, prepped with Betadine, draped in usual sterile fashion, infiltrated locally with 1% lidocaine. Under ultrasound, right common femoral artery localized. Under real-time ultrasound guidance, the vessel was accessed with a 21-gauge micropuncture needle, exchanged over a 018 guidewire for a transitional dilator, through which a 035 guidewire was advanced. A 5 French vascular sheath was placed. A 5 French C2 catheter advanced and used to selectively catheterize the splenic artery for selective arteriography. Catheter was then pulled back into the celiac axis for additional selective arteriography. Catheter then used to selectively catheterize the superior mesenteric artery for selective arteriography in multiple projections. The catheter was exchanged over the guidewire for a  RIM catheter for additional SMA arteriography. The RIM was exchanged back for the C2, used to selectively catheterize the inferior mesenteric artery for selective arteriography in multiple projections. A renegade micro catheter was then coaxially advanced with the aid of a Fathom guidewire and used to selectively catheterize 3 different third order branches of the IMA for additional supra selective arteriography. Active extravasation at the level of the proximal descending colon was treated with 2 mm coil embolization. Follow-up arteriography was performed. The micro catheter was then redirected and coil embolization of a second site of active extravasation at the level of the distal descending colon was performed. Follow-up arteriography was performed. The micro catheter and C2 were then withdrawn. After confirmatory femoral arteriogram, the sheath was removed and hemostasis achieved with the  aid of Exoseal device. The patient tolerated the procedure well. COMPLICATIONS: None immediate FINDINGS: Selective splenic and celiac arteriography demonstrated no active extravasation, pseudoaneurysm, or other unexpected arterial finding. 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 Selective superior mesenteric arteriography demonstrated no active extravasation, pseudoaneurysm, or other vascular abnormality. The proper hepatic artery originates from the gastroduodenal artery, an anatomic variant. Venous phase confirms patency of superior mesenteric vein and portal vein with antegrade flow. Inferior mesenteric arteriography demonstrates several extraluminal foci of active extravasation in the left retroperitoneum, corresponding to findings on recent CT. Subselective micro catheter arteriography localized the segmental supply to an area of active extravasation at the level of the proximal descending colon. This area was coil embolized, and follow-up arteriography demonstrated no further active extravasation. Sub selective micro catheter arteriography at the level of the sigmoid colon failed to localize the branches supplying the smaller site of active extravasation. Subselective micro catheter arteriography at the level of the distal descending colon localized a site of active extravasation, and once the supplying vessels were identified, septal sub selective coil Embolization was performed. Follow-up arteriography demonstrated no further extravasation in this region. IMPRESSION: 1. At least 3 separate foci of active extravasation in the left lower retroperitoneum from small branches of the inferior mesenteric artery circulation. 2. Technically successful coil embolization of 2 sites of active extravasation. Sub selective supply to the third site at the level of the sigmoid colon could not be localized. Critical Value/emergent results were discussed with  Dr. Carolynne Edouard from surgery, who verbally acknowledged these results. 3. Splenic vein occlusion, as previously documented. Electronically Signed   By: Corlis Leak M.D.   On: 02/10/2015 16:23   Dg Chest Portable 1 View  02/09/2015  CLINICAL DATA:  29 year old female with severe lower abdominal pain and shortness of breath. EXAM: PORTABLE CHEST 1 VIEW COMPARISON:  Chest x-ray 01/31/2015. FINDINGS: Small bilateral pleural effusions (left greater than right). Bibasilar opacities favored to predominantly reflect subsegmental atelectasis (underlying airspace consolidation in the left lower lobe is not excluded). Mid to upper lungs are clear. Pulmonary venous congestion, without frank pulmonary edema. Heart size appears borderline enlarged. Upper mediastinal contours are slightly distorted by patient positioning. IMPRESSION: 1. Small bilateral pleural effusions (left greater than right) with probable bibasilar subsegmental atelectasis. 2. Borderline cardiomegaly with mild pulmonary venous congestion, but no frank pulmonary edema. Electronically Signed   By: Trudie Reed M.D.   On: 02/09/2015 21:10   Dg Chest Port 1 View  01/31/2015  CLINICAL DATA:  Nonproductive cough. EXAM: PORTABLE CHEST 1 VIEW COMPARISON:  July 15, 2014. FINDINGS: The heart size and mediastinal contours are within normal limits. No pneumothorax is noted. Right lung is clear. Mild  left basilar subsegmental atelectasis is noted. No significant pleural effusion is noted. The visualized skeletal structures are unremarkable. IMPRESSION: Mild left basilar subsegmental atelectasis. Electronically Signed   By: Lupita Raider, M.D.   On: 01/31/2015 12:36   Ct Renal Stone Study  01/29/2015  CLINICAL DATA:  Left flank pain and vomiting. EXAM: CT ABDOMEN AND PELVIS WITHOUT CONTRAST TECHNIQUE: Multidetector CT imaging of the abdomen and pelvis was performed following the standard protocol without IV contrast. COMPARISON:  CT scan dated 07/13/2014  FINDINGS: Lower chest:  Normal. Hepatobiliary: New hepatomegaly with increased severe hepatic steatosis. The gallbladder appears increased in density primarily in contrast to the liver parenchyma. Pancreas: The pancreas is diffusely swollen with extensive peripancreatic fluid extending around the left adrenal gland and around the spleen and posterior to the stomach. No biliary or pancreatic ductal dilatation. The fluid extends down the left pericolic gutter into the pelvis. Small amount of fluid around the right lobe of the liver and in the right pericolic gutter. Spleen: Normal. Adrenals/Urinary Tract: Adrenal glands are normal. Kidneys are normal. Ureters are not dilated. Bladder is normal. Stomach/Bowel: Normal including the terminal ileum and appendix. Small calcification in the terminal ileum. Vascular/Lymphatic: Normal. Reproductive: Normal. Other: No free air. Musculoskeletal: Normal. IMPRESSION: 1. Acute diffuse pancreatitis with extensive peripancreatic fluid extending into the left upper quadrant and down the left pericolic gutter into the pelvis. 2. New hepatomegaly with marked progression of hepatic steatosis. Electronically Signed   By: Francene Boyers M.D.   On: 01/29/2015 13:55   Ir Embo Art  Peter [facility] Hem[facility] [employer] Guide Roadmapping  02/10/2015  CLINICAL DATA:  Acute pancreatitis with retroperitoneal fluid collections, splenic vein thrombosis, on anticoagulation. Recent CT demonstrates new left retroperitoneal hematoma with multifocal regions of active extravasation. Significant blood loss requiring transfusion. EXAM: SELECTIVE VISCERAL ARTERIOGRAPHY; IR EMBO ART VEN HEMORR LYMPH EXTRAV INC GUIDE ROADMAPPING; ADDITIONAL ARTERIOGRAPHY; IR ULTRASOUND GUIDANCE VASC ACCESS RIGHT MEDICATIONS: Intravenous Fentanyl and Versed were administered as conscious sedation during continuous cardiorespiratory monitoring by the radiology RN, with a total moderate sedation time of 85 minutes. CONTRAST:   OMNIPAQUE IOHEXOL 300 MG/ML  SOLN FLUOROSCOPY TIME:  3.5 minutes, 48 51  uGym2 DAP PROCEDURE: The procedure, risks (including but not limited to bleeding, infection, organ damage ), benefits, and alternatives were explained to the patient. Questions regarding the procedure were encouraged and answered. The patient understands and consents to the procedure. Site was marked, prepped with Betadine, draped in usual sterile fashion, infiltrated locally with 1% lidocaine. Under ultrasound, right common femoral artery localized. Under real-time ultrasound guidance, the vessel was accessed with a 21-gauge micropuncture needle, exchanged over a 018 guidewire for a transitional dilator, through which a 035 guidewire was advanced. A 5 French vascular sheath was placed. A 5 French C2 catheter advanced and used to selectively catheterize the splenic artery for selective arteriography. Catheter was then pulled back into the celiac axis for additional selective arteriography. Catheter then used to selectively catheterize the superior mesenteric artery for selective arteriography in multiple projections. The catheter was exchanged over the guidewire for a RIM catheter for additional SMA arteriography. The RIM was exchanged back for the C2, used to selectively catheterize the inferior mesenteric artery for selective arteriography in multiple projections. A renegade micro catheter was then coaxially advanced with the aid of a Fathom guidewire and used to selectively catheterize 3 different third order branches of the IMA for additional supra selective arteriography. Active extravasation at the level of the  proximal descending colon was treated with 2 mm coil embolization. Follow-up arteriography was performed. The micro catheter was then redirected and coil embolization of a second site of active extravasation at the level of the distal descending colon was performed. Follow-up arteriography was performed. The micro catheter and C2  were then withdrawn. After confirmatory femoral arteriogram, the sheath was removed and hemostasis achieved with the aid of Exoseal device. The patient tolerated the procedure well. COMPLICATIONS: None immediate FINDINGS: Selective splenic and celiac arteriography demonstrated no active extravasation, pseudoaneurysm, or other unexpected arterial finding. 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 Selective superior mesenteric arteriography demonstrated no active extravasation, pseudoaneurysm, or other vascular abnormality. The proper hepatic artery originates from the gastroduodenal artery, an anatomic variant. Venous phase confirms patency of superior mesenteric vein and portal vein with antegrade flow. Inferior mesenteric arteriography demonstrates several extraluminal foci of active extravasation in the left retroperitoneum, corresponding to findings on recent CT. Subselective micro catheter arteriography localized the segmental supply to an area of active extravasation at the level of the proximal descending colon. This area was coil embolized, and follow-up arteriography demonstrated no further active extravasation. Sub selective micro catheter arteriography at the level of the sigmoid colon failed to localize the branches supplying the smaller site of active extravasation. Subselective micro catheter arteriography at the level of the distal descending colon localized a site of active extravasation, and once the supplying vessels were identified, septal sub selective coil Embolization was performed. Follow-up arteriography demonstrated no further extravasation in this region. IMPRESSION: 1. At least 3 separate foci of active extravasation in the left lower retroperitoneum from small branches of the inferior mesenteric artery circulation. 2. Technically successful coil embolization of 2 sites of active extravasation. Sub selective supply to the third  site at the level of the sigmoid colon could not be localized. Critical Value/emergent results were discussed with Dr. Carolynne Edouard from surgery, who verbally acknowledged these results. 3. Splenic vein occlusion, as previously documented. Electronically Signed   By: Corlis Leak M.D.   On: 02/10/2015 16:23    Microbiology: Recent Results (from the past 240 hour(s))  Wet prep, genital     Status: None   Collection Time: 02/09/15  9:13 PM  Result Value Ref Range Status   Yeast Wet Prep HPF POC NONE SEEN NONE SEEN Final   Trich, Wet Prep NONE SEEN NONE SEEN Final   Clue Cells Wet Prep HPF POC NONE SEEN NONE SEEN Final   WBC, Wet Prep HPF POC NONE SEEN NONE SEEN Final  MRSA PCR Screening     Status: None   Collection Time: 02/10/15  1:04 AM  Result Value Ref Range Status   MRSA by PCR NEGATIVE NEGATIVE Final    Comment:        The GeneXpert MRSA Assay (FDA approved for NASAL specimens only), is one component of a comprehensive MRSA colonization surveillance program. It is not intended to diagnose MRSA infection nor to guide or monitor treatment for MRSA infections.   Culture, blood (routine x 2)     Status: None (Preliminary result)   Collection Time: 02/16/15  4:06 PM  Result Value Ref Range Status   Specimen Description BLOOD RIGHT ARM  Final   Special Requests   Final    BOTTLES DRAWN AEROBIC AND ANAEROBIC 10CC BOTH BOTTLES   Culture   Final    NO GROWTH 2 DAYS Performed at [facility]    Report Status PENDING  Incomplete  Culture, blood (routine x 2)     Status: None (Preliminary result)   Collection Time: 02/16/15  4:20 PM  Result Value Ref Range Status   Specimen Description BLOOD LEFT ARM  Final   Special Requests   Final    BOTTLES DRAWN AEROBIC AND ANAEROBIC 10CC BOTH BOTTLES   Culture   Final    NO GROWTH 2 DAYS Performed at [facility]    Report Status PENDING  Incomplete     Labs: Basic Metabolic Panel:  Recent Labs Lab 02/12/15 0348  02/12/15 0430  02/14/15 0427 02/15/15 1652 02/16/15 0530 02/17/15 0521 02/18/15 0448  NA 135  --   < > 137 137 137 138 138  K 2.9*  --   < > 3.1* 2.9* 3.1* 3.2* 3.2*  CL 106  --   < > 104 105 104 105 103  CO2 23  --   < > 27 26 26 29 29   GLUCOSE 131*  --   < > 110* 107* 118* 114* 119*  BUN 8  --   < > 5* <5* <5* <5* <5*  CREATININE 0.52  --   < > 0.48 0.53 0.50 0.53 0.45  CALCIUM 7.8*  --   < > 8.4* 8.5* 8.3* 8.4* 8.2*  MG 1.4* 1.5*  --  1.8  --   --  1.8  --   PHOS 2.0*  --   --  3.0  --   --   --   --   < > = values in this interval not displayed. Liver Function Tests: No results for input(s): AST, ALT, ALKPHOS, BILITOT, PROT, ALBUMIN in the last 168 hours.  Recent Labs Lab 02/12/15 0348 02/13/15 0420  LIPASE 21 20   No results for input(s): AMMONIA in the last 168 hours. CBC:  Recent Labs Lab 02/13/15 0420 02/14/15 0427 02/16/15 2055 02/17/15 0521 02/18/15 0448  WBC 16.7* 13.5* 17.6* 13.6* 10.4  HGB 7.6* 8.5* 10.3* 9.1* 8.8*  HCT 23.3* 26.8* 31.7* 29.5* 27.7*  MCV 95.5 98.9 95.8 97.4 98.9  PLT 342 437* 562* 542* 476*   Cardiac Enzymes: No results for input(s): CKTOTAL, CKMB, CKMBINDEX, TROPONINI in the last 168 hours. BNP: BNP (last 3 results)  Recent Labs  07/06/14 1255  BNP 79.2    ProBNP (last 3 results) No results for input(s): PROBNP in the last 8760 hours.  CBG: No results for input(s): GLUCAP in the last 168 hours.     SignedZannie Cove  [facility] 02/18/2015, 6:58 PM

## 2015-02-18 NOTE — Progress Notes (Signed)
Nutrition Follow-up  DOCUMENTATION CODES:   Obesity unspecified  INTERVENTION:  No nutrition intervention needed at this time   NUTRITION DIAGNOSIS:   Inadequate oral intake related to altered GI function as evidenced by per patient/family report.  No longer needed GOAL:   Patient will meet greater than or equal to 90% of their needs   Met  MONITOR:   Diet advancement, PO intake, Weight trends, Labs, I & O's  REASON FOR ASSESSMENT:   Consult Assessment of nutrition requirement/status  ASSESSMENT:   Kara [name] is a 29 y.o. female with recent hospitalization for Alcoholic Pancreatitis and Splenic Vein Thrombosis Who was discharged on 10/23 and returns due to complaints of 10/10 pain in the lower ABD and Lower back Described as dull and constant. An extensive workup was performed in the ED, and a Ct scan of the ABD /Pelvis revealed an Intra-Abdominal Hematoma. Her PT/INR was found to be 2.29, and Genreral Surgery was consulted. FFP and Vitamin K was ordered to reverse her INR and she was admitted to Stepdown for monitoring.    PT & INR are now WNL. K 3.2, Ca 8.2, AST 64/ Pt does not exhibit any barriers to adequate nutrition PO intake Average 62.5% Average 84.5% for past 4 meals.  Diet Order:  Diet regular Room service appropriate?: Yes; Fluid consistency:: Thin  Skin:  Wound (see comment) (Bilateral ecchymosis to arm and abdomen.)  Last BM:  02/11/2015  Height:   Ht Readings from Last 1 Encounters:  02/10/15 5' 5" (1.651 m)    Weight:   Wt Readings from Last 1 Encounters:  02/18/15 246 lb 7.6 oz (111.8 kg)    Ideal Body Weight:  63.64 kg  BMI:  Body mass index is 41.02 kg/(m^2).  Estimated Nutritional Needs:   Kcal:  1600 - 1800  Protein:  70 - 80 grams  Fluid:  >/= 1.6L  EDUCATION NEEDS:   No education needs identified at this time   M. , MS, RD LDN After Hours/Weekend Pager [phone]  

## 2015-02-19 DIAGNOSIS — K860 Alcohol-induced chronic pancreatitis: Secondary | ICD-10-CM

## 2015-02-19 DIAGNOSIS — R609 Edema, unspecified: Secondary | ICD-10-CM

## 2015-02-19 LAB — BASIC METABOLIC PANEL
ANION GAP: 6 (ref 5–15)
BUN: <5 mg/dL — ABNORMAL LOW (ref 6–20)
CHLORIDE: 99 mmol/L — AB (ref 101–111)
CO2: 31 mmol/L (ref 22–32)
Calcium: 8.3 mg/dL — ABNORMAL LOW (ref 8.9–10.3)
Creatinine, Ser: 0.5 mg/dL (ref 0.44–1.00)
GFR calc Af Amer: >60 mL/min (ref >60)
GFR calc non Af Amer: >60 mL/min (ref >60)
GLUCOSE: 103 mg/dL — AB (ref 65–99)
POTASSIUM: 3.8 mmol/L (ref 3.5–5.1)
Sodium: 136 mmol/L (ref 135–145)

## 2015-02-19 LAB — HIV ANTIBODY (ROUTINE TESTING W REFLEX): HIV Screen 4th Generation wRfx: NONREACTIVE

## 2015-02-19 MED ORDER — FUROSEMIDE 10 MG/ML IJ SOLN
20.0000 mg | Freq: Two times a day (BID) | INTRAMUSCULAR | Status: DC
  Administered 2015-02-19 – 2015-02-21 (×5): 20 mg via INTRAVENOUS
  Filled 2015-02-19 (×5): qty 2

## 2015-02-19 NOTE — Plan of Care (Signed)
Problem: Phase III Progression Outcomes Goal: Pain controlled on oral analgesia Outcome: Not Progressing Pt continues to request IV pain control with pain at level 8--atttempt to bridge over to PO pain control.

## 2015-02-19 NOTE — Progress Notes (Signed)
[facility] PROGRESS NOTE  Kara [name] [id number] DOB: December 24, 1985 DOA: 02/09/2015 PCP:  Duane Lope, MD  Assessment/Plan:  29 Y/O with recent hospitalization for alcohol pancreatitis, splenic vein thrombosis recently discharged on anticoagulation admitted for abdominal pain  Found to have multiple fluid collections, probably pseudocysts and intra abd hemorrhage. She underwent IR guided embolization of the IMA branches.  She was on antibiotics pro phylactically for her pancreatitis. She was spiking fevers over the weekend without any source of infection. ID consulted and recommended to stop the antibiotics and watch her off antibiotics.  Today she does not have any fever. Her pain is better controlled, but as per RN, pt is constantly asking for her dilaudid. i increased her oxy and decreased her dilaudid to 0.5 every 4 hours.   Acute respiratory failure; On [place] oxygen to keep sats greater than 90%  Resolved.  CXR showed bilateral effusions.  Plan to wean her off the oxygen.   Anasarca Will check albumin Lasix 20 mg IV every 12 hours Check BMP in a.m.  Fever T  CXR SHOWS BILATERAL EFFUSIONS, UA is negative for infection.  Repeat CT abd and pelvis showed improving pancreatitis but worsening of size of the hematoma. Curb side with surgery recommended watchful observation.  ID consulted and recommended stopping the antibiotics and watch her . Her leukocytosis is improving despite fevers.  Monitor.   Hypovolemic shock from intraabdominal hemorrhage: Resolved with fluid resuscitation.  Decreased the fluid rate and now stop the fluids as her bp is stable.  She is started on lasix for diuresis, she is fluid overloaded from the resuscitation.   Acute renal failure: From volume depletion.  Resolved.   Hypokalemia: replete as needed.  Repeat in am.  Alcoholic pancreatitis, splenic vein thrombosis and abd hematoma: IR guided embolization of the IMA branches.  Abdominal pain  improved.but repeat CT shows enlargement of the abdominal hematoma's, despite stable hemoglobin. Gi consulted for recommendations. No intervention at this time.  If she has worsening hemorrhage, plan to contact IR for nuclear bleeding scan.   Acute blood loss anemia :  from intra abd hemorrhage.  Monitor.  Stable between 8 to 9.   Tachycardia; On metoprolol at home, which was resumed today.   Code Status: full code.  Family Communication: none at bedside Disposition Plan:     Consultants:  PCCM  SURGERY  ID  Gastroenterology.   IR  Procedures: IR guided embolization of the IMA branches.   Antibiotics:  anidulafungin 10/25 D/C ON 10/31  primaxin 10/24- 10/31   HPI/Subjective: Patient complains of worsening leg swelling. Denies dyspnea on exertion.  Objective: Filed Vitals:   02/19/15 1330  BP: 116/64  Pulse: 112  Temp: 99 F (37.2 C)  Resp: 16    Intake/Output Summary (Last 24 hours) at 02/19/15 1731 Last data filed at 02/19/15 1707  Gross per 24 hour  Intake    480 ml  Output   1800 ml  Net  -1320 ml   Filed Weights   02/17/15 0558 02/18/15 0424 02/19/15 0519  Weight: 113.7 kg (250 lb 10.6 oz) 111.8 kg (246 lb 7.6 oz) 110.7 kg (244 lb 0.8 oz)    Exam:   General:  Appears in no acute distress  Cardiovascular: s1s2 regular  Respiratory: Decreased breath sounds at lung bases  Abdomen: Soft, nontender  Musculoskeletal: Bilateral 3+ pitting edema  Data Reviewed: Basic Metabolic Panel:  Recent Labs Lab 02/14/15 0427 02/15/15 1652 02/16/15 0530 02/17/15 0521 02/18/15 0448 02/19/15 2725  NA 137 137 137 138 138 136  K 3.1* 2.9* 3.1* 3.2* 3.2* 3.8  CL 104 105 104 105 103 99*  CO2 GLUCOSE 110* 107* 118* 114* 119* 103*  BUN 5* <5* <5* <5* <5* <5*  CREATININE 0.48 0.53 0.50 0.53 0.45 0.50  CALCIUM 8.4* 8.5* 8.3* 8.4* 8.2* 8.3*  MG 1.8  --   --  1.8  --   --   PHOS 3.0  --   --   --   --   --    Liver Function  Tests: No results for input(s): AST, ALT, ALKPHOS, BILITOT, PROT, ALBUMIN in the last 168 hours.  Recent Labs Lab 02/13/15 0420  LIPASE 20   No results for input(s): AMMONIA in the last 168 hours. CBC:  Recent Labs Lab 02/13/15 0420 02/14/15 0427 02/16/15 2055 02/17/15 0521 02/18/15 0448  WBC 16.7* 13.5* 17.6* 13.6* 10.4  HGB 7.6* 8.5* 10.3* 9.1* 8.8*  HCT 23.3* 26.8* 31.7* 29.5* 27.7*  MCV 95.5 98.9 95.8 97.4 98.9  PLT 342 437* 562* 542* 476*   Cardiac Enzymes: No results for input(s): CKTOTAL, CKMB, CKMBINDEX, TROPONINI in the last 168 hours. BNP (last 3 results)  Recent Labs  07/06/14 1255  BNP 79.2    ProBNP (last 3 results) No results for input(s): PROBNP in the last 8760 hours.  CBG: No results for input(s): GLUCAP in the last 168 hours.  Recent Results (from the past 240 hour(s))  Wet prep, genital     Status: None   Collection Time: 02/09/15  9:13 PM  Result Value Ref Range Status   Yeast Wet Prep HPF POC NONE SEEN NONE SEEN Final   Trich, Wet Prep NONE SEEN NONE SEEN Final   Clue Cells Wet Prep HPF POC NONE SEEN NONE SEEN Final   WBC, Wet Prep HPF POC NONE SEEN NONE SEEN Final  MRSA PCR Screening     Status: None   Collection Time: 02/10/15  1:04 AM  Result Value Ref Range Status   MRSA by PCR NEGATIVE NEGATIVE Final    Comment:        The GeneXpert MRSA Assay (FDA approved for NASAL specimens only), is one component of a comprehensive MRSA colonization surveillance program. It is not intended to diagnose MRSA infection nor to guide or monitor treatment for MRSA infections.   Culture, blood (routine x 2)     Status: None (Preliminary result)   Collection Time: 02/16/15  4:06 PM  Result Value Ref Range Status   Specimen Description BLOOD RIGHT ARM  Final   Special Requests   Final    BOTTLES DRAWN AEROBIC AND ANAEROBIC 10CC BOTH BOTTLES   Culture   Final    NO GROWTH 3 DAYS Performed at [facility]    Report Status PENDING   Incomplete  Culture, blood (routine x 2)     Status: None (Preliminary result)   Collection Time: 02/16/15  4:20 PM  Result Value Ref Range Status   Specimen Description BLOOD LEFT ARM  Final   Special Requests   Final    BOTTLES DRAWN AEROBIC AND ANAEROBIC 10CC BOTH BOTTLES   Culture   Final    NO GROWTH 3 DAYS Performed at [facility]    Report Status PENDING  Incomplete     Studies: No results found.  Scheduled Meds: . antiseptic oral rinse  7 mL Mouth Rinse BID  . furosemide  20 mg Intravenous Q12H  .  gabapentin  300 mg Oral BID WC  . gabapentin  600 mg Oral QHS  . loratadine  10 mg Oral Daily  . metoprolol succinate  25 mg Oral Daily  . montelukast  10 mg Oral Daily  . multivitamin with minerals  1 tablet Oral Daily  . pantoprazole  40 mg Oral Daily   Continuous Infusions:    Principal Problem:   Abdominal hematoma Active Problems:   Chronic diastolic heart failure (HCC)   Intractable abdominal pain   Edema   Leukocytosis   Splenic vein thrombosis   Intra-abdominal hematoma   Warfarin-induced coagulopathy (HCC)   Hypertension   Hypoalbuminemia due to protein-calorie malnutrition (HCC)   Alcoholic pancreatitis   Acute blood loss anemia   FUO (fever of unknown origin)    Time spent: 25 minutes.     [facility] Center[facility],Verl Whitmore S  [facility] Pager [phone]  If 7PM-7AM, please contact night-coverage at [email or web address], password [email or web address] 02/19/2015, 5:31 PM  LOS: 10 days

## 2015-02-20 DIAGNOSIS — I10 Essential (primary) hypertension: Secondary | ICD-10-CM

## 2015-02-20 LAB — COMPREHENSIVE METABOLIC PANEL
ALK PHOS: 136 U/L — AB (ref 38–126)
ALT: 14 U/L (ref 14–54)
ANION GAP: 10 (ref 5–15)
AST: 36 U/L (ref 15–41)
Albumin: 2.6 g/dL — ABNORMAL LOW (ref 3.5–5.0)
BILIRUBIN TOTAL: 1.2 mg/dL (ref 0.3–1.2)
BUN: <5 mg/dL — ABNORMAL LOW (ref 6–20)
CALCIUM: 9 mg/dL (ref 8.9–10.3)
CO2: 32 mmol/L (ref 22–32)
CREATININE: 0.62 mg/dL (ref 0.44–1.00)
Chloride: 95 mmol/L — ABNORMAL LOW (ref 101–111)
Glucose, Bld: 113 mg/dL — ABNORMAL HIGH (ref 65–99)
Potassium: 3.6 mmol/L (ref 3.5–5.1)
SODIUM: 137 mmol/L (ref 135–145)
TOTAL PROTEIN: 7.4 g/dL (ref 6.5–8.1)

## 2015-02-20 MED ORDER — HYDROMORPHONE HCL 1 MG/ML IJ SOLN
1.0000 mg | INTRAMUSCULAR | Status: DC | PRN
  Administered 2015-02-20 – 2015-02-21 (×5): 1 mg via INTRAVENOUS
  Filled 2015-02-20 (×6): qty 1

## 2015-02-20 NOTE — Progress Notes (Signed)
[facility] PROGRESS NOTE  Kara [name] ZOX:[id number] DOB: 08/15/1985 DOA: 02/09/2015 PCP:  Duane Lope, MD  Assessment/Plan:  29 Y/O with recent hospitalization for alcohol pancreatitis, splenic vein thrombosis recently discharged on anticoagulation admitted for abdominal pain  Found to have multiple fluid collections, probably pseudocysts and intra abd hemorrhage. She underwent IR guided embolization of the IMA branches.  She was on antibiotics pro phylactically for her pancreatitis. She was spiking fevers over the weekend without any source of infection. ID consulted and recommended to stop the antibiotics and watch her off antibiotics.  Today she does not have any fever. Her pain is better controlled, but as per RN, pt is constantly asking for her dilaudid. i increased her oxy and decreased her dilaudid to 0.5 every 4 hours.   Acute respiratory failure; Resolved.  CXR showed bilateral effusions.  Patient is now off oxygen ambulating without getting dyspneic.  Anasarca Started on Lasix 20 mg IV every 12 hours Patient had good diuretic response with Lasix. Will continue with IV Lasix for 1 more day and then switch to by mouth Lasix tomorrow.  Fever  Patient having low-grade fever with temperature around 57F CXR SHOWS BILATERAL EFFUSIONS, UA is negative for infection.  Repeat CT abd and pelvis showed improving pancreatitis but worsening of size of the hematoma. Curb side with surgery recommended watchful observation.  ID consulted and recommended stopping the antibiotics and watch her . Called and discussed with Dr. Algis Liming, who recommends not starting any more antibiotics.  Hypovolemic shock from intraabdominal hemorrhage: Resolved with fluid resuscitation.  She is started on lasix for diuresis, she is fluid overloaded from the resuscitation.   Acute renal failure: From volume depletion.  Resolved.   Hypokalemia: replete as needed.  Repeat in am.  Alcoholic  pancreatitis, splenic vein thrombosis and abd hematoma: IR guided embolization of the IMA branches.  Abdominal pain improved.but repeat CT shows enlargement of the abdominal hematoma's, despite stable hemoglobin. Gi consulted for recommendations. No intervention at this time.  If she has worsening hemorrhage, plan to contact IR for nuclear bleeding scan.   Acute blood loss anemia :  from intra abd hemorrhage.  Monitor.  Stable between 8 to 9.    Code Status: full code.  Family Communication: none at bedside Disposition Plan:     Consultants:  PCCM  SURGERY  ID  Gastroenterology.   IR  Procedures: IR guided embolization of the IMA branches.   Antibiotics:  anidulafungin 10/25 D/C ON 10/31  primaxin 10/24- 10/31   HPI/Subjective: Patient feels better with IV Lasix. Urine output 2750 over the past 24 hours  Objective: Filed Vitals:   02/20/15 0800  BP: 112/62  Pulse: 116  Temp: 99.5 F (37.5 C)  Resp: 18    Intake/Output Summary (Last 24 hours) at 02/20/15 1201 Last data filed at 02/20/15 1041  Gross per 24 hour  Intake   1920 ml  Output   2450 ml  Net   -530 ml   Filed Weights   02/18/15 0424 02/19/15 0519 02/20/15 0502  Weight: 111.8 kg (246 lb 7.6 oz) 110.7 kg (244 lb 0.8 oz) 104.6 kg (230 lb 9.6 oz)    Exam:   General:  Appears in no acute distress  Cardiovascular: s1s2 regular  Respiratory: Decreased breath sounds at lung bases  Abdomen: Soft, nontender  Musculoskeletal: Bilateral 3+ pitting edema  Data Reviewed: Basic Metabolic Panel:  Recent Labs Lab 02/14/15 0427  02/16/15 0530 02/17/15 8119 02/18/15 0448 02/19/15 0437 02/20/15 0429  NA 137  < > 137 138 138 136 137  K 3.1*  < > 3.1* 3.2* 3.2* 3.8 3.6  CL 104  < > 104 105 103 99* 95*  CO2 27  < > 32  GLUCOSE 110*  < > 118* 114* 119* 103* 113*  BUN 5*  < > <5* <5* <5* <5* <5*  CREATININE 0.48  < > 0.50 0.53 0.45 0.50 0.62  CALCIUM 8.4*  < > 8.3* 8.4* 8.2*  8.3* 9.0  MG 1.8  --   --  1.8  --   --   --   PHOS 3.0  --   --   --   --   --   --   < > = values in this interval not displayed. Liver Function Tests:  Recent Labs Lab 02/20/15 0429  AST 36  ALT 14  ALKPHOS 136*  BILITOT 1.2  PROT 7.4  ALBUMIN 2.6*   No results for input(s): LIPASE, AMYLASE in the last 168 hours. No results for input(s): AMMONIA in the last 168 hours. CBC:  Recent Labs Lab 02/14/15 0427 02/16/15 2055 02/17/15 0521 02/18/15 0448  WBC 13.5* 17.6* 13.6* 10.4  HGB 8.5* 10.3* 9.1* 8.8*  HCT 26.8* 31.7* 29.5* 27.7*  MCV 98.9 95.8 97.4 98.9  PLT 437* 562* 542* 476*   Cardiac Enzymes: No results for input(s): CKTOTAL, CKMB, CKMBINDEX, TROPONINI in the last 168 hours. BNP (last 3 results)  Recent Labs  07/06/14 1255  BNP 79.2    ProBNP (last 3 results) No results for input(s): PROBNP in the last 8760 hours.  CBG: No results for input(s): GLUCAP in the last 168 hours.  Recent Results (from the past 240 hour(s))  Culture, blood (routine x 2)     Status: None (Preliminary result)   Collection Time: 02/16/15  4:06 PM  Result Value Ref Range Status   Specimen Description BLOOD RIGHT ARM  Final   Special Requests   Final    BOTTLES DRAWN AEROBIC AND ANAEROBIC 10CC BOTH BOTTLES   Culture   Final    NO GROWTH 3 DAYS Performed at [facility]    Report Status PENDING  Incomplete  Culture, blood (routine x 2)     Status: None (Preliminary result)   Collection Time: 02/16/15  4:20 PM  Result Value Ref Range Status   Specimen Description BLOOD LEFT ARM  Final   Special Requests   Final    BOTTLES DRAWN AEROBIC AND ANAEROBIC 10CC BOTH BOTTLES   Culture   Final    NO GROWTH 3 DAYS Performed at [facility]    Report Status PENDING  Incomplete     Studies: No results found.  Scheduled Meds: . antiseptic oral rinse  7 mL Mouth Rinse BID  . furosemide  20 mg Intravenous Q12H  . gabapentin  300 mg Oral BID WC  . gabapentin  600 mg  Oral QHS  . loratadine  10 mg Oral Daily  . metoprolol succinate  25 mg Oral Daily  . montelukast  10 mg Oral Daily  . multivitamin with minerals  1 tablet Oral Daily  . pantoprazole  40 mg Oral Daily   Continuous Infusions:    Principal Problem:   Abdominal hematoma Active Problems:   Chronic diastolic heart failure (HCC)   Intractable abdominal pain   Edema   Leukocytosis   Splenic vein thrombosis   Intra-abdominal hematoma   Warfarin-induced coagulopathy (HCC)   Hypertension  Hypoalbuminemia due to protein-calorie malnutrition (HCC)   Alcoholic pancreatitis   Acute blood loss anemia   FUO (fever of unknown origin)    Time spent: 25 minutes.     [facility] CenterAMA,Kara [name] S  [facility] Pager [phone]  If 7PM-7AM, please contact night-coverage at [email or web address], password [email or web address] 02/20/2015, 12:01 PM  LOS: 11 days

## 2015-02-21 ENCOUNTER — Encounter (HOSPITAL_COMMUNITY): Payer: Self-pay | Admitting: Radiology

## 2015-02-21 ENCOUNTER — Inpatient Hospital Stay (HOSPITAL_COMMUNITY): Payer: BC Managed Care – PPO

## 2015-02-21 DIAGNOSIS — D689 Coagulation defect, unspecified: Secondary | ICD-10-CM

## 2015-02-21 DIAGNOSIS — T45511A Poisoning by anticoagulants, accidental (unintentional), initial encounter: Secondary | ICD-10-CM

## 2015-02-21 DIAGNOSIS — D735 Infarction of spleen: Secondary | ICD-10-CM

## 2015-02-21 DIAGNOSIS — R509 Fever, unspecified: Secondary | ICD-10-CM

## 2015-02-21 LAB — BASIC METABOLIC PANEL
Anion gap: 9 (ref 5–15)
CALCIUM: 8.5 mg/dL — AB (ref 8.9–10.3)
CO2: 31 mmol/L (ref 22–32)
Chloride: 95 mmol/L — ABNORMAL LOW (ref 101–111)
Creatinine, Ser: 0.66 mg/dL (ref 0.44–1.00)
GFR calc Af Amer: >60 mL/min (ref >60)
GLUCOSE: 124 mg/dL — AB (ref 65–99)
Potassium: 3.3 mmol/L — ABNORMAL LOW (ref 3.5–5.1)
SODIUM: 135 mmol/L (ref 135–145)

## 2015-02-21 LAB — CULTURE, BLOOD (ROUTINE X 2)
CULTURE: NO GROWTH
Culture: NO GROWTH

## 2015-02-21 LAB — CBC
HCT: 29.5 % — ABNORMAL LOW (ref 36.0–46.0)
Hemoglobin: 9.2 g/dL — ABNORMAL LOW (ref 12.0–15.0)
MCH: 30.9 pg (ref 26.0–34.0)
MCHC: 31.2 g/dL (ref 30.0–36.0)
MCV: 99 fL (ref 78.0–100.0)
Platelets: 555 10*3/uL — ABNORMAL HIGH (ref 150–400)
RBC: 2.98 MIL/uL — AB (ref 3.87–5.11)
RDW: 14.9 % (ref 11.5–15.5)
WBC: 10.4 10*3/uL (ref 4.0–10.5)

## 2015-02-21 MED ORDER — IOHEXOL 300 MG/ML  SOLN: 25.0000 mL | INTRAMUSCULAR | Status: AC

## 2015-02-21 MED ORDER — MOMETASONE FURO-FORMOTEROL FUM 100-5 MCG/ACT IN AERO
2.0000 | INHALATION_SPRAY | Freq: Two times a day (BID) | RESPIRATORY_TRACT | Status: DC
  Filled 2015-02-21: qty 8.8

## 2015-02-21 MED ORDER — ALBUTEROL SULFATE (2.5 MG/3ML) 0.083% IN NEBU: 2.5000 mg | INHALATION_SOLUTION | Freq: Four times a day (QID) | RESPIRATORY_TRACT | Status: DC | PRN

## 2015-02-21 MED ORDER — ALPRAZOLAM 0.25 MG PO TABS: 0.2500 mg | ORAL_TABLET | Freq: Two times a day (BID) | ORAL | Status: DC | PRN

## 2015-02-21 MED ORDER — FUROSEMIDE 20 MG PO TABS: 20.0000 mg | ORAL_TABLET | Freq: Two times a day (BID) | ORAL | Status: DC

## 2015-02-21 MED ORDER — OXYCODONE HCL 5 MG PO TABS: 5.0000 mg | ORAL_TABLET | Freq: Four times a day (QID) | ORAL | Status: DC | PRN

## 2015-02-21 MED ORDER — POTASSIUM CHLORIDE CRYS ER 20 MEQ PO TBCR: 20.0000 meq | EXTENDED_RELEASE_TABLET | Freq: Every day | ORAL | Status: DC

## 2015-02-21 MED ORDER — POTASSIUM CHLORIDE CRYS ER 20 MEQ PO TBCR
40.0000 meq | EXTENDED_RELEASE_TABLET | Freq: Once | ORAL | Status: AC
  Administered 2015-02-21: 40 meq via ORAL
  Filled 2015-02-21: qty 2

## 2015-02-21 MED ORDER — IOHEXOL 300 MG/ML  SOLN
100.0000 mL | Freq: Once | INTRAMUSCULAR | Status: AC | PRN
  Administered 2015-02-21: 100 mL via INTRAVENOUS

## 2015-02-21 NOTE — Discharge Summary (Addendum)
Physician Discharge Summary  Kara [name] ZOX:[id number] DOB: 06/14/1985 DOA: 02/09/2015  PCP:  Duane Lopeoss, Alan, MD  Admit date: 02/09/2015 Discharge date: 02/21/2015  Time spent: *25 min minutes  Recommendations for Outpatient Follow-up:  1. Follow-up PCP in one week 2. Follow-up infectious disease in one week  Discharge Diagnoses:  Principal Problem:   Abdominal hematoma Active Problems:   Chronic diastolic heart failure (HCC)   Intractable abdominal pain   Edema   Leukocytosis   Splenic vein thrombosis   Intra-abdominal hematoma   Warfarin-induced coagulopathy (HCC)   Hypertension   Hypoalbuminemia due to protein-calorie malnutrition (HCC)   Alcoholic pancreatitis   Acute blood loss anemia   FUO (fever of unknown origin)   Discharge Condition: Stable  Diet recommendation: Regular diet  Filed Weights   02/19/15 0519 02/20/15 0502 02/21/15 0426  Weight: 110.7 kg (244 lb 0.8 oz) 104.6 kg (230 lb 9.6 oz) 104.5 kg (230 lb 6.1 oz)    History of present illness:   29 y/o F with a PMH of asthma, GERD, seasonal allergies, hypertension, prolonged QT syndrome, headaches, alcohol abuse (drinks approximately 4 shots per day on the weekend and occasional during the week) and a recent hospitalization (10/12-10/23) for alcoholic pancreatitis complicated by splenic vein thrombosis (discharged on lovenox / coumadin) who presented [facility]23 with acute abdominal pain The patient reported 6 hours after discharge she developed intense abdominal pain, nausea/vomiting and bilateral lower extremity swelling. CTA of abdomen demonstrated concern for new intra-abdominal hematoma, inflammation and evolving pseudocysts. She reported she had picked up her new prescriptions but had not had the opportunity to take them as prescribed. She activated EMS and [facility]. On arrival she reported she had taken prescribed oxycodone for pain without  relief. He described the pain is different from her pancreatitis-type pain. ER workup notable for negative urinalysis and documented benign pelvic exam. Vaginal ultrasound ruled out ovarian torsion. She was noted to be tachycardic with stable blood pressure and hemoglobin. CT of the abdomen was evaluated which was concerning for interval development of a large heterogenous 13.7x10x7.4 cm fluid collection in L mid abdomen/LLQ tracking inferiorly from teh pancreatitic tail anterior to the left kidney & L psoas muscle concerning for intra-abdominal hematoma, multiple small areas of active bleeding. Additional new retroperitoneal dense soft tissue inflammation at the level of the aortic bifurcation 7.5x5.2x2.9 cm concerning for extension of soft tissue inflammation into the inferior retroperitoneum. New evolving pseudocysts in pancreatic tail up to 5.2 cm in size. Evolving pseudocyst at the fundus of the stomach with complex fluid tracking about the spleen, complex fluid in the pelvis, small L pleural effusion, diffuse fatty infiltration of the liver. INR on admission was 2.3.   Hospital Course:  29 Y/O with recent hospitalization for alcohol pancreatitis, splenic vein thrombosis recently discharged on anticoagulation admitted for abdominal pain Found to have multiple fluid collections, probably pseudocysts and intra abd hemorrhage. She underwent IR guided embolization of the IMA branches. She was on antibiotics pro phylactically for her pancreatitis. She was spiking fevers over the weekend without any source of infection. ID consulted and recommended to stop the antibiotics and watch her off antibiotics.    Acute respiratory failure; Resolved.  CXR showed bilateral effusions.  Patient is now off oxygen ambulating without getting dyspneic.  Anasarca/fluid overload Patient has a low albumin 2.7, also received aggressive fluid resuscitation for hypovolemic shock which has now resolved Started on  Lasix 20  mg IV every 12 hours Patient had good diuretic response with Lasix. Will discharge the patient home on Lasix 20 mg by mouth every 12 hours   Fever  Patient developed fever despite being on broad-spectrum antibiotics which was suspected to be noninfectious causes including hematoma as per ID Patient still having intermittent fevers as high as 102F, white count is normal, CBC done today shows WBC of 10.4. Called and discussed with infectious disease Dr. Zenaida Niece dam, who recommended to repeat CT abdomen pelvis which showed slight decrease size of large left retroperitoneal hematoma, stable fluid collection around the pancreatic body and tail likely pseudocyst. Discussed the results of above with Dr. Algis Liming, who recommends not to start any more antibiotics and okay to discharge patient home. He will follow the patient in one week.  Hypovolemic shock from intraabdominal hemorrhage: Resolved with fluid resuscitation.  She is started on lasix for diuresis, she is fluid overloaded from the resuscitation.   Acute renal failure: From volume depletion.  Resolved.   Hypokalemia:  Will give 1 dose of Kdur  40 meq by mouth 1 Will discharge the patient on potassium 20 meq by mouth daily   Sinus tachycardia Patient having sinus tachycardia in the hospital likely from the intra-abdominal hematoma. Patient restarted on metoprolol 25 mg by mouth daily  Alcoholic pancreatitis, splenic vein thrombosis and abd hematoma: IR guided embolization of the IMA branches.  Patient is not a candidate for anticoagulation due to the hematoma.  Acute blood loss anemia : from intra abd hemorrhage.  Stable,  today hemoglobin is 9.2   Procedures:  10/12 - 10/23 Admit for alcoholic pancreatitis, complicated by splenic vein thrombosis 10/23- Readmit for abd pain with intrabd hematoma. 10/24- S/p Mesenteric angiography, IR emobolization of IMA branches x 2 02/10/15, Dr. Oley Balm  Consultations:  Surgery  IR  Infectious disease  Discharge Exam: Filed Vitals:   02/21/15 1311  BP: 100/59  Pulse: 122  Temp: 100 F (37.8 C)  Resp: 18    General: Appears in no acute distress Cardiovascular: S1-S2 regular Respiratory: *clear to auscultation bilaterally Extremities- bilateral 2+ pitting edema  Discharge Instructions   Discharge Instructions    Diet - low sodium heart healthy    Complete by:  As directed      Increase activity slowly    Complete by:  As directed           Current Discharge Medication List    CONTINUE these medications which have CHANGED   Details  furosemide (LASIX) 20 MG tablet Take 1 tablet (20 mg total) by mouth 2 (two) times daily. Qty: 60 tablet, Refills: 1    oxyCODONE (OXY IR/ROXICODONE) 5 MG immediate release tablet Take 1 tablet (5 mg total) by mouth every 6 (six) hours as needed for severe pain. Qty: 30 tablet, Refills: 0    potassium chloride SA (K-DUR,KLOR-CON) 20 MEQ tablet Take 1 tablet (20 mEq total) by mouth daily. Take while on Lasix Qty: 30 tablet, Refills: 0      CONTINUE these medications which have NOT CHANGED   Details  albuterol (PROVENTIL HFA;VENTOLIN HFA) 108 (90 BASE) MCG/ACT inhaler Inhale 2 puffs into the lungs every 6 (six) hours as needed for wheezing or shortness of breath (wheezing and sob).     fexofenadine (ALLEGRA) 180 MG tablet Take 180 mg by mouth daily as needed for allergies (allergies).     gabapentin (NEURONTIN) 300 MG capsule One in am, one in evening and two po at night  Qty: 120 capsule, Refills: 11    metoprolol succinate (TOPROL-XL) 25 MG 24 hr tablet Take 1 tablet (25 mg total) by mouth daily. Qty: 30 tablet, Refills: 11    mometasone-formoterol (DULERA) 200-5 MCG/ACT AERO Inhale 2 puffs into the lungs 2 (two) times daily.    montelukast (SINGULAIR) 10 MG tablet Take 10 mg by mouth daily.     Multiple Vitamin (MULTIVITAMIN WITH MINERALS) TABS tablet Take 1 tablet  by mouth daily. Qty: 30 tablet, Refills: 0    ondansetron (ZOFRAN) 4 MG tablet Take 1 tablet (4 mg total) by mouth every 8 (eight) hours as needed for nausea or vomiting. Qty: 20 tablet, Refills: 0    polyethylene glycol (MIRALAX / GLYCOLAX) packet Take 17 g by mouth daily as needed. Qty: 14 each, Refills: 0      STOP taking these medications     cefUROXime (CEFTIN) 500 MG tablet      metroNIDAZOLE (FLAGYL) 500 MG tablet      warfarin (COUMADIN) 5 MG tablet        Allergies  Allergen Reactions  . Peanuts [Peanut Oil] Itching    Sensitivity       The results of significant diagnostics from this hospitalization (including imaging, microbiology, ancillary and laboratory) are listed below for reference.    Significant Diagnostic Studies: Dg Chest 1 View  02/10/2015  CLINICAL DATA:  Central line placement EXAM: CHEST 1 VIEW COMPARISON:  02/09/2015 FINDINGS: Left IJ venous catheter terminates in the mid SVC. Patchy right lower lobe opacity, likely atelectasis. Cardiomegaly with pulmonary vascular congestion and possible mild interstitial edema edema. Moderate layering left pleural effusion, mildly increased. Small right pleural effusion. IMPRESSION: Left IJ venous catheter terminates in the mid SVC. Cardiomegaly pulmonary vascular congestion and possible mild interstitial edema. Moderate layering left pleural effusion, mildly increased. Small right pleural effusion. Patchy right lower lobe opacity, likely atelectasis. Electronically Signed   By: Charline Bills M.D.   On: 02/10/2015 11:54   Dg Chest 2 View  02/16/2015  CLINICAL DATA:  Shortness of breath EXAM: CHEST  2 VIEW COMPARISON:  02/10/2015 FINDINGS: There is cardiac enlargement. Bilateral pleural effusions are identified left greater than right. No interstitial edema or airspace consolidation. IMPRESSION: 1. Cardiac enlargement and bilateral pleural effusions. Electronically Signed   By: Signa Kell M.D.   On: 02/16/2015  12:59   Mr Abdomen W Wo Contrast  02/03/2015  CLINICAL DATA:  Portal vein thrombosis questioned on recent CT scan. Pancreatitis. EXAM: MRI ABDOMEN WITHOUT AND WITH CONTRAST TECHNIQUE: Multiplanar multisequence MR imaging of the abdomen was performed both before and after the administration of intravenous contrast. CONTRAST:  20mL MULTIHANCE GADOBENATE DIMEGLUMINE 529 MG/ML IV SOLN COMPARISON:  02/01/2015 FINDINGS: Lower chest: Moderate left and small right pleural effusions with associated passive atelectasis. Hepatobiliary: Diffuse hepatic steatosis with extensive dropout of signal in the hepatic parenchyma on out of phase images compared in phase images. Pancreas: Suspected pancreatic pseudocyst tracking along the left lateral border of the stomach, one measuring about 4.5 cm diameter and the other measuring about 3.5 cm diameter, with adjoining peripancreatic fluid collection on image 17 series 8. Additional peripancreatic acute fluid collection extending caudad along the left anterior retroperitoneal margin. There is increased T2 signal in the pancreas but I do not observe a pancreatic mass. Spleen: Unremarkable Adrenals/Urinary Tract: Unremarkable Stomach/Bowel: Aside from the suspected pseudocysts noted above, no significant abnormality. Vascular/Lymphatic: Thrombosed splenic vein, new from 01/29/2015 but stable compared to 02/01/2015. The thrombosis of the splenic  vein extends to the confluence of the superior mesenteric vein in the splenic vein, with a small amount of nonocclusive clot extending down into the SMV and into the portal vein confluence. Other: No supplemental non-categorized findings. Musculoskeletal: Unremarkable IMPRESSION: 1. Splenic vein thrombosis/occlusion. Thrombus in the splenic vein extends into the portal vein confluence, with small amounts of nonocclusive thrombus extending in the portal vein and proximal SMV. 2. Diffuse hepatic steatosis. 3. Pancreatitis with acute peripancreatic  fluid collections and probable early pseudocyst formation along the left lateral border of the stomach. Fluid collection with somewhat irregular enhancing margins tracks around the anterior perirenal fascia all margin ; given the enhancing margins, early abscess formation is not readily excluded. I do not see pancreatic parenchymal necrosis or an abscess involving the pancreatic parenchyma itself. Electronically Signed   By: Gaylyn Rong M.D.   On: 02/03/2015 15:20   US Transvaginal Non-ob  02/09/2015  ADDENDUM REPORT: 02/09/2015 23:43 ADDENDUM: The Doppler findings should read: Pulsed Doppler evaluation of the right ovary demonstrates normal low-resistance arterial and venous waveforms. The left ovary is not visualized on this study. Subsequent CT demonstrates a grossly unremarkable appearance to both ovaries. Electronically Signed   By: Roanna Raider M.D.   On: 02/09/2015 23:43  02/09/2015  CLINICAL DATA:  Acute onset of pelvic pain.  Initial encounter. EXAM: TRANSABDOMINAL AND TRANSVAGINAL ULTRASOUND OF PELVIS DOPPLER ULTRASOUND OF OVARIES TECHNIQUE: Both transabdominal and transvaginal ultrasound examinations of the pelvis were performed. Transabdominal technique was performed for global imaging of the pelvis including uterus, ovaries, adnexal regions, and pelvic cul-de-sac. It was necessary to proceed with endovaginal exam following the transabdominal exam to visualize the uterus and ovaries in greater detail. Color and duplex Doppler ultrasound was utilized to evaluate blood flow to the ovaries. COMPARISON:  CT of the abdomen and pelvis from 02/01/2015 FINDINGS: Uterus Measurements: 4.6 x 2.0 x 2.6 cm. No fibroids or other mass visualized. Endometrium Thickness: 0.2 cm.  No focal abnormality visualized. Right ovary Measurements: 2.9 x 2.2 x 2.7 cm. Normal appearance/no adnexal mass. Left ovary Not visualized. Pulsed Doppler evaluation of both ovaries demonstrates normal low-resistance arterial and  venous waveforms. Other findings A small amount of free fluid is seen within the pelvic cul-de-sac. IMPRESSION: Unremarkable pelvic ultrasound. No evidence for ovarian torsion. The left ovary is not visualized on this study. Electronically Signed: By: Roanna Raider M.D. On: 02/09/2015 23:08   US Pelvis Complete  02/09/2015  ADDENDUM REPORT: 02/09/2015 23:43 ADDENDUM: The Doppler findings should read: Pulsed Doppler evaluation of the right ovary demonstrates normal low-resistance arterial and venous waveforms. The left ovary is not visualized on this study. Subsequent CT demonstrates a grossly unremarkable appearance to both ovaries. Electronically Signed   By: Roanna Raider M.D.   On: 02/09/2015 23:43  02/09/2015  CLINICAL DATA:  Acute onset of pelvic pain.  Initial encounter. EXAM: TRANSABDOMINAL AND TRANSVAGINAL ULTRASOUND OF PELVIS DOPPLER ULTRASOUND OF OVARIES TECHNIQUE: Both transabdominal and transvaginal ultrasound examinations of the pelvis were performed. Transabdominal technique was performed for global imaging of the pelvis including uterus, ovaries, adnexal regions, and pelvic cul-de-sac. It was necessary to proceed with endovaginal exam following the transabdominal exam to visualize the uterus and ovaries in greater detail. Color and duplex Doppler ultrasound was utilized to evaluate blood flow to the ovaries. COMPARISON:  CT of the abdomen and pelvis from 02/01/2015 FINDINGS: Uterus Measurements: 4.6 x 2.0 x 2.6 cm. No fibroids or other mass visualized. Endometrium Thickness: 0.2 cm.  No focal abnormality  visualized. Right ovary Measurements: 2.9 x 2.2 x 2.7 cm. Normal appearance/no adnexal mass. Left ovary Not visualized. Pulsed Doppler evaluation of both ovaries demonstrates normal low-resistance arterial and venous waveforms. Other findings A small amount of free fluid is seen within the pelvic cul-de-sac. IMPRESSION: Unremarkable pelvic ultrasound. No evidence for ovarian torsion. The left  ovary is not visualized on this study. Electronically Signed: By: Roanna Raider M.D. On: 02/09/2015 23:08   Ct Abdomen Pelvis W Contrast  02/21/2015  CLINICAL DATA:  Pelvic hematoma, follow-up. Status post embolization EXAM: CT ABDOMEN AND PELVIS WITH CONTRAST TECHNIQUE: Multidetector CT imaging of the abdomen and pelvis was performed using the standard protocol following bolus administration of intravenous contrast. CONTRAST:  OMNIPAQUE IOHEXOL 300 MG/ML  SOLN COMPARISON:  02/16/2015 FINDINGS: Diffuse fatty infiltration of the liver. Spleen, pancreas, adrenals and kidneys are normal. Large left retroperitoneal hematoma again noted. This measures 11.1 x 9.2 cm on image 56. When measured at the same level and in the same planes on prior study, this measured 11.2 x 10 cm. The hematoma is lower density suggesting liquefaction and evolutionary changes of the blood products. Fluid collection again noted adjacent to the distal pancreatic body and tail is stable. This may reflect pseudo cyst. Uterus, adnexae and urinary bladder are unremarkable. Stomach, large and small bowel grossly unremarkable. Aorta is normal caliber. No adenopathy. Bibasilar areas of atelectasis noted.  Small left pleural effusion. IMPRESSION: Slight decreased size of the large left retroperitoneal hematoma with evolutionary changes of the blood products (lower density). Stable fluid collection around the pancreatic body and tail, likely pseudocyst. Fatty infiltration of the liver. Areas of bibasilar atelectasis.  Small left effusion. Electronically Signed   By: Charlett Nose M.D.   On: 02/21/2015 12:57   Ct Abdomen Pelvis W Contrast  02/16/2015  CLINICAL DATA:  Follow-up pancreatitis with hematoma formation. Status post embolization of inferior mesenteric artery bleeds. EXAM: CT ABDOMEN AND PELVIS WITH CONTRAST TECHNIQUE: Multidetector CT imaging of the abdomen and pelvis was performed using the standard protocol following bolus  administration of intravenous contrast. CONTRAST:  OMNIPAQUE IOHEXOL 300 MG/ML  SOLN COMPARISON:  CT abdomen and pelvis February 09, 2015 FINDINGS: LUNG BASES: Small, similar LEFT pleural effusion. Patchy consolidation in the lower lobes bilaterally with air bronchograms, small lingular consolidation. Heart size is normal, no pericardial effusions. SOLID ORGANS: The liver is diffusely hypodense compatible with steatosis, otherwise unremarkable. Contracted gallbladder. Adrenal glands and spleen are normal. 11 x 9 x 15.2 cm LEFT abdomen and pelvis heterogeneously dense hematoma was 7.4 x 10 x 13.7 cm. No central contrast extravasation, marginal metallic densities consistent with coil embolic material. Free fluid about the distal pancreatic body and tail, with faint hypo enhancement. Attenuated splenic vein, with occluded on recent angiogram. Pseudocyst contiguous with the stomach was 3.9 cm, now 3.3 cm. Retroperitoneal small pseudocyst was 3.6 cm, now 2.6 cm Spleen is otherwise unremarkable. Gallbladder, pancreas and adrenal glands are unremarkable. GASTROINTESTINAL TRACT: The stomach, small and large bowel are normal in course and caliber without inflammatory changes. Contrast in the colon. The appendix is not discretely identified, however there are no inflammatory changes in the right lower quadrant. KIDNEYS/ URINARY TRACT: Kidneys are orthotopic, demonstrating symmetric enhancement. No nephrolithiasis, hydronephrosis or solid renal masses. The unopacified ureters are normal in course and caliber. Delayed imaging through the kidneys demonstrates symmetric prompt contrast excretion within the proximal urinary collecting system. Urinary bladder is partially distended and unremarkable. PERITONEUM/RETROPERITONEUM: Aortoiliac vessels are normal in course  and caliber. Small central mesenteric lymph nodes. No lymphadenopathy by CT size criteria. Internal reproductive organs are unremarkable. Small amount of ascites,  no intraperitoneal free air. SOFT TISSUE/OSSEOUS STRUCTURES: Mild anasarca. Scattered chronic Schmorl's nodes without destructive bony lesions. IMPRESSION: Enlarging 11 x 9 x 15.2 cm LEFT abdominal pelvic hematoma was 7.4 x 10 x 13.7 cm. No active contrast extravasation on this non angiographic phase though, continued bleeding is a consideration. Improving acute pancreatitis, with faint hypo enhancement in the tail of the pancreas concerning for necrosis. Decreasing size of distal peripancreatic pseudocysts. Bibasilar enhancing consolidation most consistent with atelectasis, less likely pneumonia with similar small LEFT pleural effusion. Severe hepatic steatosis. Electronically Signed   By: Awilda Metro M.D.   On: 02/16/2015 20:46   Ct Abdomen Pelvis W Contrast  02/09/2015  CLINICAL DATA:  Acute onset of pelvic and lower abdominal pain. Lower back pain. Initial encounter. EXAM: CT ABDOMEN AND PELVIS WITH CONTRAST TECHNIQUE: Multidetector CT imaging of the abdomen and pelvis was performed using the standard protocol following bolus administration of intravenous contrast. CONTRAST:  OMNIPAQUE IOHEXOL 300 MG/ML  SOLN COMPARISON:  CT of the abdomen and pelvis performed 02/01/2015, and pelvic ultrasound performed earlier today at 10:21 p.m. FINDINGS: A small left pleural effusion is noted, with bibasilar atelectasis. Since the prior study, there has been interval development of a large heterogeneous 13.7 x 10.0 x 7.4 cm collection of fluid at the left mid abdomen and left lower quadrant, tracking inferiorly from the pancreatic tail, with multiple small foci of high attenuation. It is noted anterior to the left kidney and left psoas musculature. This is highly suspicious for a new intra-abdominal hematoma, with multiple small foci of contrast extravasation, reflecting active bleeding. This is a very unusual appearance for hemorrhagic pancreatitis. New retroperitoneal dense soft tissue inflammation is also  noted about the level of the aortic bifurcation, measuring 7.5 x 5.2 x 2.9 cm, concerning for extension of soft tissue inflammation into the inferior retroperitoneum. New evolving pseudocysts are noted about the pancreatic tail, measuring up to 5.2 cm in size. One of these fluid collections extends adjacent to small bowel loops. Previously noted soft tissue inflammation about the head and body of the pancreas has improved. There is no evidence of devascularization. There is diffuse fatty infiltration within the liver. The spleen is unremarkable in appearance, though inflammatory fluid is seen tracking about the spleen. The gallbladder is grossly unremarkable. The adrenal glands are grossly unremarkable, though the left adrenal gland is difficult to fully assess due to surrounding fluid. The kidneys are unremarkable in appearance. There is no evidence of hydronephrosis. No renal or ureteral stones are seen. No perinephric stranding is appreciated. Minimal left-sided perinephric fluid likely reflects the overlying hematoma. The small bowel is unremarkable in appearance. A vague evolving pseudocyst is noted at the fundus of the stomach, similar in appearance to the prior study, measuring approximately 4.0 cm. The stomach is otherwise grossly unremarkable in appearance. The appendix is normal in caliber, without evidence of appendicitis. The colon is decompressed and not well assessed, but appears grossly unremarkable. The bladder is decompressed. Somewhat complex fluid is noted tracking within the pelvis, likely extending from the patient's pancreatitis. The uterus is unremarkable in appearance. The ovaries are grossly symmetric. No suspicious adnexal masses are seen. No inguinal lymphadenopathy is seen. No acute osseous abnormalities are identified. IMPRESSION: 1. Interval development of a large heterogeneous 13.7 x 10.0 x 7.4 cm collection of fluid at the left mid abdomen and  left lower quadrant, tracking inferiorly  from the pancreatic tail anterior to the left kidney and left psoas musculature. Multiple small foci of high attenuation noted within the collection. This is highly suspicious for a new intra-abdominal hematoma, with multiple small foci of active bleeding. This may reflect pancreatic enzymes eroding into tiny surrounding vessels, given that the patient is on anticoagulation. This is a very unusual appearance for hemorrhagic pancreatitis. 2. Additional new retroperitoneal dense soft tissue inflammation noted about the level of the aortic bifurcation, measuring 7.5 x 5.2 x 2.9 cm, concerning for extension of soft tissue inflammation into the inferior retroperitoneum. 3. New evolving pseudocysts about the pancreatic tail, measuring up to 5.2 cm in size. One of these fluid collections extends adjacent to small bowel loops, though the small bowel is unremarkable in appearance. Soft tissue inflammation about the head and body of the pancreas has improved. No evidence of devascularization. 4. 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 Complex fluid tracks about the spleen. 5. Somewhat complex fluid noted tracking within the pelvis, likely extending from the patient's pancreatitis. 6. Small left pleural effusion noted, with bibasilar atelectasis. 7. Diffuse fatty infiltration within the liver. Critical Value/emergent results were called by telephone at the time of interpretation on 02/09/2015 at 11:35 pm to Dr. Glynn Octave, who verbally acknowledged these results. Electronically Signed   By: Roanna Raider M.D.   On: 02/09/2015 23:57   Ct Abdomen Pelvis W Contrast  02/01/2015  CLINICAL DATA:  Follow-up pancreatitis EXAM: CT ABDOMEN AND PELVIS WITH CONTRAST TECHNIQUE: Multidetector CT imaging of the abdomen and pelvis was performed using the standard protocol following bolus administration of intravenous contrast. CONTRAST:  50mL OMNIPAQUE  IOHEXOL 300 MG/ML SOLN, OMNIPAQUE IOHEXOL 300 MG/ML SOLN COMPARISON:  Ten/ 12/16 FINDINGS: There is small left pleural effusion. Trace right pleural effusion. Bilateral lower lobe posterior atelectasis. Again noted significant fatty infiltration of the liver. Heart size within normal limits. Again noted peripancreatic fluid and significant stranding of peripancreatic fat consistent with acute pancreatitis. There is increasing peripancreatic fluid from prior exam. Small amount of perisplenic ascites. There is moderate ascites in left paracolic gutter. Small loculated fluid is noted adjacent to proximal gastric wall suspicious for loculated ascites or pancreatic pseudocyst. No definite pancreatic abscess is identified. The SMA and celiac trunk are patent. In axial image 35 there is filling defect in portal vein highly suspicious for nonocclusive thrombus. Similar appearance in axial image 36. The superior mesenteric vein is patent. No calcified gallstones are noted within gallbladder. No focal splenic mass. Kidneys are symmetrical in size. No hydronephrosis or hydroureter. Small amount of mesenteric ascites is noted adjacent to distal small bowel loops. There is moderate pelvic ascites. The uterus and ovaries are unremarkable. No distal colonic obstruction. Urinary bladder is unremarkable. There is no small bowel obstruction. No pericecal inflammation. The terminal ileum is unremarkable. IMPRESSION: 1. There is small bilateral pleural effusion left greater than right with bilateral lower lobe posterior atelectasis. Significant fatty infiltration of the liver again noted. Again noted significant peripancreatic stranding and there is increase in peripancreatic fluid. Findings are consistent with acute pancreatitis. 2. Small perisplenic ascites. Moderate ascites noted in left paracolic gutter. Moderate pelvic ascites. Small ascites in lower mesentery adjacent to small bowel loops. 3. There is loculated fluid  collection adjacent to proximal stomach just anterior to the spleen measures about 3.8 cm. This is suspicious for loculated ascites or pancreatic pseudocyst.  4. Question nonocclusive thrombosis in portal vein axial image 35 and 36. The superior mesenteric vein is patent. 5. No hydronephrosis or hydroureter. 6. No small bowel obstruction. Electronically Signed   By: Natasha Mead M.D.   On: 02/01/2015 12:48   Ir Angiogram Visceral Selective  02/10/2015  CLINICAL DATA:  Acute pancreatitis with retroperitoneal fluid collections, splenic vein thrombosis, on anticoagulation. Recent CT demonstrates new left retroperitoneal hematoma with multifocal regions of active extravasation. Significant blood loss requiring transfusion. EXAM: SELECTIVE VISCERAL ARTERIOGRAPHY; IR EMBO ART VEN HEMORR LYMPH EXTRAV INC GUIDE ROADMAPPING; ADDITIONAL ARTERIOGRAPHY; IR ULTRASOUND GUIDANCE VASC ACCESS RIGHT MEDICATIONS: Intravenous Fentanyl and Versed were administered as conscious sedation during continuous cardiorespiratory monitoring by the radiology RN, with a total moderate sedation time of 85 minutes. CONTRAST:  OMNIPAQUE IOHEXOL 300 MG/ML  SOLN FLUOROSCOPY TIME:  3.5 minutes, 48 51  uGym2 DAP PROCEDURE: The procedure, risks (including but not limited to bleeding, infection, organ damage ), benefits, and alternatives were explained to the patient. Questions regarding the procedure were encouraged and answered. The patient understands and consents to the procedure. Site was marked, prepped with Betadine, draped in usual sterile fashion, infiltrated locally with 1% lidocaine. Under ultrasound, right common femoral artery localized. Under real-time ultrasound guidance, the vessel was accessed with a 21-gauge micropuncture needle, exchanged over a 018 guidewire for a transitional dilator, through which a 035 guidewire was advanced. A 5 French vascular sheath was placed. A 5 French C2 catheter advanced and used to selectively  catheterize the splenic artery for selective arteriography. Catheter was then pulled back into the celiac axis for additional selective arteriography. Catheter then used to selectively catheterize the superior mesenteric artery for selective arteriography in multiple projections. The catheter was exchanged over the guidewire for a RIM catheter for additional SMA arteriography. The RIM was exchanged back for the C2, used to selectively catheterize the inferior mesenteric artery for selective arteriography in multiple projections. A renegade micro catheter was then coaxially advanced with the aid of a Fathom guidewire and used to selectively catheterize 3 different third order branches of the IMA for additional supra selective arteriography. Active extravasation at the level of the proximal descending colon was treated with 2 mm coil embolization. Follow-up arteriography was performed. The micro catheter was then redirected and coil embolization of a second site of active extravasation at the level of the distal descending colon was performed. Follow-up arteriography was performed. The micro catheter and C2 were then withdrawn. After confirmatory femoral arteriogram, the sheath was removed and hemostasis achieved with the aid of Exoseal device. The patient tolerated the procedure well. COMPLICATIONS: None immediate FINDINGS: Selective splenic and celiac arteriography demonstrated no active extravasation, pseudoaneurysm, or other unexpected arterial finding. 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 Selective superior mesenteric arteriography demonstrated no active extravasation, pseudoaneurysm, or other vascular abnormality. The proper hepatic artery originates from the gastroduodenal artery, an anatomic variant. Venous phase confirms patency of superior mesenteric vein and portal vein with antegrade flow. Inferior mesenteric arteriography demonstrates  several extraluminal foci of active extravasation in the left retroperitoneum, corresponding to findings on recent CT. Subselective micro catheter arteriography localized the segmental supply to an area of active extravasation at the level of the proximal descending colon. This area was coil embolized, and follow-up arteriography demonstrated no further active extravasation. Sub selective micro catheter arteriography at the level of the sigmoid colon failed to localize the branches supplying the smaller site of active extravasation. Subselective micro catheter  arteriography at the level of the distal descending colon localized a site of active extravasation, and once the supplying vessels were identified, septal sub selective coil Embolization was performed. Follow-up arteriography demonstrated no further extravasation in this region. IMPRESSION: 1. At least 3 separate foci of active extravasation in the left lower retroperitoneum from small branches of the inferior mesenteric artery circulation. 2. Technically successful coil embolization of 2 sites of active extravasation. Sub selective supply to the third site at the level of the sigmoid colon could not be localized. Critical Value/emergent results were discussed with Dr. Carolynne Edouard from surgery, who verbally acknowledged these results. 3. Splenic vein occlusion, as previously documented. Electronically Signed   By: Corlis Leak M.D.   On: 02/10/2015 16:23   Ir Angiogram Visceral Selective  02/10/2015  CLINICAL DATA:  Acute pancreatitis with retroperitoneal fluid collections, splenic vein thrombosis, on anticoagulation. Recent CT demonstrates new left retroperitoneal hematoma with multifocal regions of active extravasation. Significant blood loss requiring transfusion. EXAM: SELECTIVE VISCERAL ARTERIOGRAPHY; IR EMBO ART VEN HEMORR LYMPH EXTRAV INC GUIDE ROADMAPPING; ADDITIONAL ARTERIOGRAPHY; IR ULTRASOUND GUIDANCE VASC ACCESS RIGHT MEDICATIONS: Intravenous Fentanyl  and Versed were administered as conscious sedation during continuous cardiorespiratory monitoring by the radiology RN, with a total moderate sedation time of 85 minutes. CONTRAST:  OMNIPAQUE IOHEXOL 300 MG/ML  SOLN FLUOROSCOPY TIME:  3.5 minutes, 48 51  uGym2 DAP PROCEDURE: The procedure, risks (including but not limited to bleeding, infection, organ damage ), benefits, and alternatives were explained to the patient. Questions regarding the procedure were encouraged and answered. The patient understands and consents to the procedure. Site was marked, prepped with Betadine, draped in usual sterile fashion, infiltrated locally with 1% lidocaine. Under ultrasound, right common femoral artery localized. Under real-time ultrasound guidance, the vessel was accessed with a 21-gauge micropuncture needle, exchanged over a 018 guidewire for a transitional dilator, through which a 035 guidewire was advanced. A 5 French vascular sheath was placed. A 5 French C2 catheter advanced and used to selectively catheterize the splenic artery for selective arteriography. Catheter was then pulled back into the celiac axis for additional selective arteriography. Catheter then used to selectively catheterize the superior mesenteric artery for selective arteriography in multiple projections. The catheter was exchanged over the guidewire for a RIM catheter for additional SMA arteriography. The RIM was exchanged back for the C2, used to selectively catheterize the inferior mesenteric artery for selective arteriography in multiple projections. A renegade micro catheter was then coaxially advanced with the aid of a Fathom guidewire and used to selectively catheterize 3 different third order branches of the IMA for additional supra selective arteriography. Active extravasation at the level of the proximal descending colon was treated with 2 mm coil embolization. Follow-up arteriography was performed. The micro catheter was then redirected  and coil embolization of a second site of active extravasation at the level of the distal descending colon was performed. Follow-up arteriography was performed. The micro catheter and C2 were then withdrawn. After confirmatory femoral arteriogram, the sheath was removed and hemostasis achieved with the aid of Exoseal device. The patient tolerated the procedure well. COMPLICATIONS: None immediate FINDINGS: Selective splenic and celiac arteriography demonstrated no active extravasation, pseudoaneurysm, or other unexpected arterial finding. 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 Selective superior mesenteric arteriography demonstrated no active extravasation, pseudoaneurysm, or other vascular abnormality. The proper hepatic artery originates from the gastroduodenal artery, an anatomic variant. Venous phase confirms patency of superior mesenteric vein and portal vein  with antegrade flow. Inferior mesenteric arteriography demonstrates several extraluminal foci of active extravasation in the left retroperitoneum, corresponding to findings on recent CT. Subselective micro catheter arteriography localized the segmental supply to an area of active extravasation at the level of the proximal descending colon. This area was coil embolized, and follow-up arteriography demonstrated no further active extravasation. Sub selective micro catheter arteriography at the level of the sigmoid colon failed to localize the branches supplying the smaller site of active extravasation. Subselective micro catheter arteriography at the level of the distal descending colon localized a site of active extravasation, and once the supplying vessels were identified, septal sub selective coil Embolization was performed. Follow-up arteriography demonstrated no further extravasation in this region. IMPRESSION: 1. At least 3 separate foci of active extravasation in the left lower  retroperitoneum from small branches of the inferior mesenteric artery circulation. 2. Technically successful coil embolization of 2 sites of active extravasation. Sub selective supply to the third site at the level of the sigmoid colon could not be localized. Critical Value/emergent results were discussed with Dr. Carolynne Edouard from surgery, who verbally acknowledged these results. 3. Splenic vein occlusion, as previously documented. Electronically Signed   By: Corlis Leak M.D.   On: 02/10/2015 16:23   Ir Angiogram Visceral Selective  02/10/2015  CLINICAL DATA:  Acute pancreatitis with retroperitoneal fluid collections, splenic vein thrombosis, on anticoagulation. Recent CT demonstrates new left retroperitoneal hematoma with multifocal regions of active extravasation. Significant blood loss requiring transfusion. EXAM: SELECTIVE VISCERAL ARTERIOGRAPHY; IR EMBO ART VEN HEMORR LYMPH EXTRAV INC GUIDE ROADMAPPING; ADDITIONAL ARTERIOGRAPHY; IR ULTRASOUND GUIDANCE VASC ACCESS RIGHT MEDICATIONS: Intravenous Fentanyl and Versed were administered as conscious sedation during continuous cardiorespiratory monitoring by the radiology RN, with a total moderate sedation time of 85 minutes. CONTRAST:  OMNIPAQUE IOHEXOL 300 MG/ML  SOLN FLUOROSCOPY TIME:  3.5 minutes, 48 51  uGym2 DAP PROCEDURE: The procedure, risks (including but not limited to bleeding, infection, organ damage ), benefits, and alternatives were explained to the patient. Questions regarding the procedure were encouraged and answered. The patient understands and consents to the procedure. Site was marked, prepped with Betadine, draped in usual sterile fashion, infiltrated locally with 1% lidocaine. Under ultrasound, right common femoral artery localized. Under real-time ultrasound guidance, the vessel was accessed with a 21-gauge micropuncture needle, exchanged over a 018 guidewire for a transitional dilator, through which a 035 guidewire was advanced. A 5 French  vascular sheath was placed. A 5 French C2 catheter advanced and used to selectively catheterize the splenic artery for selective arteriography. Catheter was then pulled back into the celiac axis for additional selective arteriography. Catheter then used to selectively catheterize the superior mesenteric artery for selective arteriography in multiple projections. The catheter was exchanged over the guidewire for a RIM catheter for additional SMA arteriography. The RIM was exchanged back for the C2, used to selectively catheterize the inferior mesenteric artery for selective arteriography in multiple projections. A renegade micro catheter was then coaxially advanced with the aid of a Fathom guidewire and used to selectively catheterize 3 different third order branches of the IMA for additional supra selective arteriography. Active extravasation at the level of the proximal descending colon was treated with 2 mm coil embolization. Follow-up arteriography was performed. The micro catheter was then redirected and coil embolization of a second site of active extravasation at the level of the distal descending colon was performed. Follow-up arteriography was performed. The micro catheter and C2 were then withdrawn. After confirmatory femoral arteriogram, the  sheath was removed and hemostasis achieved with the aid of Exoseal device. The patient tolerated the procedure well. COMPLICATIONS: None immediate FINDINGS: Selective splenic and celiac arteriography demonstrated no active extravasation, pseudoaneurysm, or other unexpected arterial finding. 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 Selective superior mesenteric arteriography demonstrated no active extravasation, pseudoaneurysm, or other vascular abnormality. The proper hepatic artery originates from the gastroduodenal artery, an anatomic variant. Venous phase confirms patency of superior mesenteric vein  and portal vein with antegrade flow. Inferior mesenteric arteriography demonstrates several extraluminal foci of active extravasation in the left retroperitoneum, corresponding to findings on recent CT. Subselective micro catheter arteriography localized the segmental supply to an area of active extravasation at the level of the proximal descending colon. This area was coil embolized, and follow-up arteriography demonstrated no further active extravasation. Sub selective micro catheter arteriography at the level of the sigmoid colon failed to localize the branches supplying the smaller site of active extravasation. Subselective micro catheter arteriography at the level of the distal descending colon localized a site of active extravasation, and once the supplying vessels were identified, septal sub selective coil Embolization was performed. Follow-up arteriography demonstrated no further extravasation in this region. IMPRESSION: 1. At least 3 separate foci of active extravasation in the left lower retroperitoneum from small branches of the inferior mesenteric artery circulation. 2. Technically successful coil embolization of 2 sites of active extravasation. Sub selective supply to the third site at the level of the sigmoid colon could not be localized. Critical Value/emergent results were discussed with Dr. Carolynne Edouard from surgery, who verbally acknowledged these results. 3. Splenic vein occlusion, as previously documented. Electronically Signed   By: Corlis Leak M.D.   On: 02/10/2015 16:23   Ir Angiogram Selective Each Additional Vessel  02/10/2015  CLINICAL DATA:  Acute pancreatitis with retroperitoneal fluid collections, splenic vein thrombosis, on anticoagulation. Recent CT demonstrates new left retroperitoneal hematoma with multifocal regions of active extravasation. Significant blood loss requiring transfusion. EXAM: SELECTIVE VISCERAL ARTERIOGRAPHY; IR EMBO ART VEN HEMORR LYMPH EXTRAV INC GUIDE ROADMAPPING;  ADDITIONAL ARTERIOGRAPHY; IR ULTRASOUND GUIDANCE VASC ACCESS RIGHT MEDICATIONS: Intravenous Fentanyl and Versed were administered as conscious sedation during continuous cardiorespiratory monitoring by the radiology RN, with a total moderate sedation time of 85 minutes. CONTRAST:  OMNIPAQUE IOHEXOL 300 MG/ML  SOLN FLUOROSCOPY TIME:  3.5 minutes, 48 51  uGym2 DAP PROCEDURE: The procedure, risks (including but not limited to bleeding, infection, organ damage ), benefits, and alternatives were explained to the patient. Questions regarding the procedure were encouraged and answered. The patient understands and consents to the procedure. Site was marked, prepped with Betadine, draped in usual sterile fashion, infiltrated locally with 1% lidocaine. Under ultrasound, right common femoral artery localized. Under real-time ultrasound guidance, the vessel was accessed with a 21-gauge micropuncture needle, exchanged over a 018 guidewire for a transitional dilator, through which a 035 guidewire was advanced. A 5 French vascular sheath was placed. A 5 French C2 catheter advanced and used to selectively catheterize the splenic artery for selective arteriography. Catheter was then pulled back into the celiac axis for additional selective arteriography. Catheter then used to selectively catheterize the superior mesenteric artery for selective arteriography in multiple projections. The catheter was exchanged over the guidewire for a RIM catheter for additional SMA arteriography. The RIM was exchanged back for the C2, used to selectively catheterize the inferior mesenteric artery for selective arteriography in multiple projections. A renegade micro catheter was then coaxially advanced with the  aid of a Fathom guidewire and used to selectively catheterize 3 different third order branches of the IMA for additional supra selective arteriography. Active extravasation at the level of the proximal descending colon was treated with 2  mm coil embolization. Follow-up arteriography was performed. The micro catheter was then redirected and coil embolization of a second site of active extravasation at the level of the distal descending colon was performed. Follow-up arteriography was performed. The micro catheter and C2 were then withdrawn. After confirmatory femoral arteriogram, the sheath was removed and hemostasis achieved with the aid of Exoseal device. The patient tolerated the procedure well. COMPLICATIONS: None immediate FINDINGS: Selective splenic and celiac arteriography demonstrated no active extravasation, pseudoaneurysm, or other unexpected arterial finding. 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 Selective superior mesenteric arteriography demonstrated no active extravasation, pseudoaneurysm, or other vascular abnormality. The proper hepatic artery originates from the gastroduodenal artery, an anatomic variant. Venous phase confirms patency of superior mesenteric vein and portal vein with antegrade flow. Inferior mesenteric arteriography demonstrates several extraluminal foci of active extravasation in the left retroperitoneum, corresponding to findings on recent CT. Subselective micro catheter arteriography localized the segmental supply to an area of active extravasation at the level of the proximal descending colon. This area was coil embolized, and follow-up arteriography demonstrated no further active extravasation. Sub selective micro catheter arteriography at the level of the sigmoid colon failed to localize the branches supplying the smaller site of active extravasation. Subselective micro catheter arteriography at the level of the distal descending colon localized a site of active extravasation, and once the supplying vessels were identified, septal sub selective coil Embolization was performed. Follow-up arteriography demonstrated no further extravasation in this  region. IMPRESSION: 1. At least 3 separate foci of active extravasation in the left lower retroperitoneum from small branches of the inferior mesenteric artery circulation. 2. Technically successful coil embolization of 2 sites of active extravasation. Sub selective supply to the third site at the level of the sigmoid colon could not be localized. Critical Value/emergent results were discussed with Dr. Carolynne Edouard from surgery, who verbally acknowledged these results. 3. Splenic vein occlusion, as previously documented. Electronically Signed   By: Corlis Leak M.D.   On: 02/10/2015 16:23   Ir Angiogram Selective Each Additional Vessel  02/10/2015  CLINICAL DATA:  Acute pancreatitis with retroperitoneal fluid collections, splenic vein thrombosis, on anticoagulation. Recent CT demonstrates new left retroperitoneal hematoma with multifocal regions of active extravasation. Significant blood loss requiring transfusion. EXAM: SELECTIVE VISCERAL ARTERIOGRAPHY; IR EMBO ART VEN HEMORR LYMPH EXTRAV INC GUIDE ROADMAPPING; ADDITIONAL ARTERIOGRAPHY; IR ULTRASOUND GUIDANCE VASC ACCESS RIGHT MEDICATIONS: Intravenous Fentanyl and Versed were administered as conscious sedation during continuous cardiorespiratory monitoring by the radiology RN, with a total moderate sedation time of 85 minutes. CONTRAST:  OMNIPAQUE IOHEXOL 300 MG/ML  SOLN FLUOROSCOPY TIME:  3.5 minutes, 48 51  uGym2 DAP PROCEDURE: The procedure, risks (including but not limited to bleeding, infection, organ damage ), benefits, and alternatives were explained to the patient. Questions regarding the procedure were encouraged and answered. The patient understands and consents to the procedure. Site was marked, prepped with Betadine, draped in usual sterile fashion, infiltrated locally with 1% lidocaine. Under ultrasound, right common femoral artery localized. Under real-time ultrasound guidance, the vessel was accessed with a 21-gauge micropuncture needle, exchanged  over a 018 guidewire for a transitional dilator, through which a 035 guidewire was advanced. A 5 French vascular sheath was placed. A 5 French C2 catheter  advanced and used to selectively catheterize the splenic artery for selective arteriography. Catheter was then pulled back into the celiac axis for additional selective arteriography. Catheter then used to selectively catheterize the superior mesenteric artery for selective arteriography in multiple projections. The catheter was exchanged over the guidewire for a RIM catheter for additional SMA arteriography. The RIM was exchanged back for the C2, used to selectively catheterize the inferior mesenteric artery for selective arteriography in multiple projections. A renegade micro catheter was then coaxially advanced with the aid of a Fathom guidewire and used to selectively catheterize 3 different third order branches of the IMA for additional supra selective arteriography. Active extravasation at the level of the proximal descending colon was treated with 2 mm coil embolization. Follow-up arteriography was performed. The micro catheter was then redirected and coil embolization of a second site of active extravasation at the level of the distal descending colon was performed. Follow-up arteriography was performed. The micro catheter and C2 were then withdrawn. After confirmatory femoral arteriogram, the sheath was removed and hemostasis achieved with the aid of Exoseal device. The patient tolerated the procedure well. COMPLICATIONS: None immediate FINDINGS: Selective splenic and celiac arteriography demonstrated no active extravasation, pseudoaneurysm, or other unexpected arterial finding. 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 Selective superior mesenteric arteriography demonstrated no active extravasation, pseudoaneurysm, or other vascular abnormality. The proper hepatic artery originates from the  gastroduodenal artery, an anatomic variant. Venous phase confirms patency of superior mesenteric vein and portal vein with antegrade flow. Inferior mesenteric arteriography demonstrates several extraluminal foci of active extravasation in the left retroperitoneum, corresponding to findings on recent CT. Subselective micro catheter arteriography localized the segmental supply to an area of active extravasation at the level of the proximal descending colon. This area was coil embolized, and follow-up arteriography demonstrated no further active extravasation. Sub selective micro catheter arteriography at the level of the sigmoid colon failed to localize the branches supplying the smaller site of active extravasation. Subselective micro catheter arteriography at the level of the distal descending colon localized a site of active extravasation, and once the supplying vessels were identified, septal sub selective coil Embolization was performed. Follow-up arteriography demonstrated no further extravasation in this region. IMPRESSION: 1. At least 3 separate foci of active extravasation in the left lower retroperitoneum from small branches of the inferior mesenteric artery circulation. 2. Technically successful coil embolization of 2 sites of active extravasation. Sub selective supply to the third site at the level of the sigmoid colon could not be localized. Critical Value/emergent results were discussed with Dr. Carolynne Edouard from surgery, who verbally acknowledged these results. 3. Splenic vein occlusion, as previously documented. Electronically Signed   By: Corlis Leak M.D.   On: 02/10/2015 16:23   Ir Angiogram Selective Each Additional Vessel  02/10/2015  CLINICAL DATA:  Acute pancreatitis with retroperitoneal fluid collections, splenic vein thrombosis, on anticoagulation. Recent CT demonstrates new left retroperitoneal hematoma with multifocal regions of active extravasation. Significant blood loss requiring transfusion.  EXAM: SELECTIVE VISCERAL ARTERIOGRAPHY; IR EMBO ART VEN HEMORR LYMPH EXTRAV INC GUIDE ROADMAPPING; ADDITIONAL ARTERIOGRAPHY; IR ULTRASOUND GUIDANCE VASC ACCESS RIGHT MEDICATIONS: Intravenous Fentanyl and Versed were administered as conscious sedation during continuous cardiorespiratory monitoring by the radiology RN, with a total moderate sedation time of 85 minutes. CONTRAST:  OMNIPAQUE IOHEXOL 300 MG/ML  SOLN FLUOROSCOPY TIME:  3.5 minutes, 48 51  uGym2 DAP PROCEDURE: The procedure, risks (including but not limited to bleeding, infection, organ damage ), benefits,  and alternatives were explained to the patient. Questions regarding the procedure were encouraged and answered. The patient understands and consents to the procedure. Site was marked, prepped with Betadine, draped in usual sterile fashion, infiltrated locally with 1% lidocaine. Under ultrasound, right common femoral artery localized. Under real-time ultrasound guidance, the vessel was accessed with a 21-gauge micropuncture needle, exchanged over a 018 guidewire for a transitional dilator, through which a 035 guidewire was advanced. A 5 French vascular sheath was placed. A 5 French C2 catheter advanced and used to selectively catheterize the splenic artery for selective arteriography. Catheter was then pulled back into the celiac axis for additional selective arteriography. Catheter then used to selectively catheterize the superior mesenteric artery for selective arteriography in multiple projections. The catheter was exchanged over the guidewire for a RIM catheter for additional SMA arteriography. The RIM was exchanged back for the C2, used to selectively catheterize the inferior mesenteric artery for selective arteriography in multiple projections. A renegade micro catheter was then coaxially advanced with the aid of a Fathom guidewire and used to selectively catheterize 3 different third order branches of the IMA for additional supra selective  arteriography. Active extravasation at the level of the proximal descending colon was treated with 2 mm coil embolization. Follow-up arteriography was performed. The micro catheter was then redirected and coil embolization of a second site of active extravasation at the level of the distal descending colon was performed. Follow-up arteriography was performed. The micro catheter and C2 were then withdrawn. After confirmatory femoral arteriogram, the sheath was removed and hemostasis achieved with the aid of Exoseal device. The patient tolerated the procedure well. COMPLICATIONS: None immediate FINDINGS: Selective splenic and celiac arteriography demonstrated no active extravasation, pseudoaneurysm, or other unexpected arterial finding. 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 Selective superior mesenteric arteriography demonstrated no active extravasation, pseudoaneurysm, or other vascular abnormality. The proper hepatic artery originates from the gastroduodenal artery, an anatomic variant. Venous phase confirms patency of superior mesenteric vein and portal vein with antegrade flow. Inferior mesenteric arteriography demonstrates several extraluminal foci of active extravasation in the left retroperitoneum, corresponding to findings on recent CT. Subselective micro catheter arteriography localized the segmental supply to an area of active extravasation at the level of the proximal descending colon. This area was coil embolized, and follow-up arteriography demonstrated no further active extravasation. Sub selective micro catheter arteriography at the level of the sigmoid colon failed to localize the branches supplying the smaller site of active extravasation. Subselective micro catheter arteriography at the level of the distal descending colon localized a site of active extravasation, and once the supplying vessels were identified, septal sub selective  coil Embolization was performed. Follow-up arteriography demonstrated no further extravasation in this region. IMPRESSION: 1. At least 3 separate foci of active extravasation in the left lower retroperitoneum from small branches of the inferior mesenteric artery circulation. 2. Technically successful coil embolization of 2 sites of active extravasation. Sub selective supply to the third site at the level of the sigmoid colon could not be localized. Critical Value/emergent results were discussed with Dr. Carolynne Edouard from surgery, who verbally acknowledged these results. 3. Splenic vein occlusion, as previously documented. Electronically Signed   By: Corlis Leak M.D.   On: 02/10/2015 16:23   [place] Art/ven Flow Abd Pelv Doppler  02/09/2015  ADDENDUM REPORT: 02/09/2015 23:43 ADDENDUM: The Doppler findings should read: Pulsed Doppler evaluation of the right ovary demonstrates normal low-resistance arterial and venous waveforms. The left ovary is  not visualized on this study. Subsequent CT demonstrates a grossly unremarkable appearance to both ovaries. Electronically Signed   By: Roanna Raider M.D.   On: 02/09/2015 23:43  02/09/2015  CLINICAL DATA:  Acute onset of pelvic pain.  Initial encounter. EXAM: TRANSABDOMINAL AND TRANSVAGINAL ULTRASOUND OF PELVIS DOPPLER ULTRASOUND OF OVARIES TECHNIQUE: Both transabdominal and transvaginal ultrasound examinations of the pelvis were performed. Transabdominal technique was performed for global imaging of the pelvis including uterus, ovaries, adnexal regions, and pelvic cul-de-sac. It was necessary to proceed with endovaginal exam following the transabdominal exam to visualize the uterus and ovaries in greater detail. Color and duplex Doppler ultrasound was utilized to evaluate blood flow to the ovaries. COMPARISON:  CT of the abdomen and pelvis from 02/01/2015 FINDINGS: Uterus Measurements: 4.6 x 2.0 x 2.6 cm. No fibroids or other mass visualized. Endometrium Thickness: 0.2 cm.  No  focal abnormality visualized. Right ovary Measurements: 2.9 x 2.2 x 2.7 cm. Normal appearance/no adnexal mass. Left ovary Not visualized. Pulsed Doppler evaluation of both ovaries demonstrates normal low-resistance arterial and venous waveforms. Other findings A small amount of free fluid is seen within the pelvic cul-de-sac. IMPRESSION: Unremarkable pelvic ultrasound. No evidence for ovarian torsion. The left ovary is not visualized on this study. Electronically Signed: By: Roanna Raider M.D. On: 02/09/2015 23:08   Ir US Guide Vasc Access Right  02/10/2015  CLINICAL DATA:  Acute pancreatitis with retroperitoneal fluid collections, splenic vein thrombosis, on anticoagulation. Recent CT demonstrates new left retroperitoneal hematoma with multifocal regions of active extravasation. Significant blood loss requiring transfusion. EXAM: SELECTIVE VISCERAL ARTERIOGRAPHY; IR EMBO ART VEN HEMORR LYMPH EXTRAV INC GUIDE ROADMAPPING; ADDITIONAL ARTERIOGRAPHY; IR ULTRASOUND GUIDANCE VASC ACCESS RIGHT MEDICATIONS: Intravenous Fentanyl and Versed were administered as conscious sedation during continuous cardiorespiratory monitoring by the radiology RN, with a total moderate sedation time of 85 minutes. CONTRAST:  OMNIPAQUE IOHEXOL 300 MG/ML  SOLN FLUOROSCOPY TIME:  3.5 minutes, 48 51  uGym2 DAP PROCEDURE: The procedure, risks (including but not limited to bleeding, infection, organ damage ), benefits, and alternatives were explained to the patient. Questions regarding the procedure were encouraged and answered. The patient understands and consents to the procedure. Site was marked, prepped with Betadine, draped in usual sterile fashion, infiltrated locally with 1% lidocaine. Under ultrasound, right common femoral artery localized. Under real-time ultrasound guidance, the vessel was accessed with a 21-gauge micropuncture needle, exchanged over a 018 guidewire for a transitional dilator, through which a 035 guidewire was  advanced. A 5 French vascular sheath was placed. A 5 French C2 catheter advanced and used to selectively catheterize the splenic artery for selective arteriography. Catheter was then pulled back into the celiac axis for additional selective arteriography. Catheter then used to selectively catheterize the superior mesenteric artery for selective arteriography in multiple projections. The catheter was exchanged over the guidewire for a RIM catheter for additional SMA arteriography. The RIM was exchanged back for the C2, used to selectively catheterize the inferior mesenteric artery for selective arteriography in multiple projections. A renegade micro catheter was then coaxially advanced with the aid of a Fathom guidewire and used to selectively catheterize 3 different third order branches of the IMA for additional supra selective arteriography. Active extravasation at the level of the proximal descending colon was treated with 2 mm coil embolization. Follow-up arteriography was performed. The micro catheter was then redirected and coil embolization of a second site of active extravasation at the level of the distal descending colon was performed. Follow-up arteriography  was performed. The micro catheter and C2 were then withdrawn. After confirmatory femoral arteriogram, the sheath was removed and hemostasis achieved with the aid of Exoseal device. The patient tolerated the procedure well. COMPLICATIONS: None immediate FINDINGS: Selective splenic and celiac arteriography demonstrated no active extravasation, pseudoaneurysm, or other unexpected arterial finding. 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 Selective superior mesenteric arteriography demonstrated no active extravasation, pseudoaneurysm, or other vascular abnormality. The proper hepatic artery originates from the gastroduodenal artery, an anatomic variant. Venous phase confirms patency of  superior mesenteric vein and portal vein with antegrade flow. Inferior mesenteric arteriography demonstrates several extraluminal foci of active extravasation in the left retroperitoneum, corresponding to findings on recent CT. Subselective micro catheter arteriography localized the segmental supply to an area of active extravasation at the level of the proximal descending colon. This area was coil embolized, and follow-up arteriography demonstrated no further active extravasation. Sub selective micro catheter arteriography at the level of the sigmoid colon failed to localize the branches supplying the smaller site of active extravasation. Subselective micro catheter arteriography at the level of the distal descending colon localized a site of active extravasation, and once the supplying vessels were identified, septal sub selective coil Embolization was performed. Follow-up arteriography demonstrated no further extravasation in this region. IMPRESSION: 1. At least 3 separate foci of active extravasation in the left lower retroperitoneum from small branches of the inferior mesenteric artery circulation. 2. Technically successful coil embolization of 2 sites of active extravasation. Sub selective supply to the third site at the level of the sigmoid colon could not be localized. Critical Value/emergent results were discussed with Dr. Carolynne Edouard from surgery, who verbally acknowledged these results. 3. Splenic vein occlusion, as previously documented. Electronically Signed   By: Corlis Leak M.D.   On: 02/10/2015 16:23   Dg Chest Portable 1 View  02/09/2015  CLINICAL DATA:  29 year old female with severe lower abdominal pain and shortness of breath. EXAM: PORTABLE CHEST 1 VIEW COMPARISON:  Chest x-ray 01/31/2015. FINDINGS: Small bilateral pleural effusions (left greater than right). Bibasilar opacities favored to predominantly reflect subsegmental atelectasis (underlying airspace consolidation in the left lower lobe is not  excluded). Mid to upper lungs are clear. Pulmonary venous congestion, without frank pulmonary edema. Heart size appears borderline enlarged. Upper mediastinal contours are slightly distorted by patient positioning. IMPRESSION: 1. Small bilateral pleural effusions (left greater than right) with probable bibasilar subsegmental atelectasis. 2. Borderline cardiomegaly with mild pulmonary venous congestion, but no frank pulmonary edema. Electronically Signed   By: Trudie Reed M.D.   On: 02/09/2015 21:10   Dg Chest Port 1 View  01/31/2015  CLINICAL DATA:  Nonproductive cough. EXAM: PORTABLE CHEST 1 VIEW COMPARISON:  July 15, 2014. FINDINGS: The heart size and mediastinal contours are within normal limits. No pneumothorax is noted. Right lung is clear. Mild left basilar subsegmental atelectasis is noted. No significant pleural effusion is noted. The visualized skeletal structures are unremarkable. IMPRESSION: Mild left basilar subsegmental atelectasis. Electronically Signed   By: Lupita Raider, M.D.   On: 01/31/2015 12:36   Ct Renal Stone Study  01/29/2015  CLINICAL DATA:  Left flank pain and vomiting. EXAM: CT ABDOMEN AND PELVIS WITHOUT CONTRAST TECHNIQUE: Multidetector CT imaging of the abdomen and pelvis was performed following the standard protocol without IV contrast. COMPARISON:  CT scan dated 07/13/2014 FINDINGS: Lower chest:  Normal. Hepatobiliary: New hepatomegaly with increased severe hepatic steatosis. The gallbladder appears increased in density primarily in contrast to  the liver parenchyma. Pancreas: The pancreas is diffusely swollen with extensive peripancreatic fluid extending around the left adrenal gland and around the spleen and posterior to the stomach. No biliary or pancreatic ductal dilatation. The fluid extends down the left pericolic gutter into the pelvis. Small amount of fluid around the right lobe of the liver and in the right pericolic gutter. Spleen: Normal. Adrenals/Urinary  Tract: Adrenal glands are normal. Kidneys are normal. Ureters are not dilated. Bladder is normal. Stomach/Bowel: Normal including the terminal ileum and appendix. Small calcification in the terminal ileum. Vascular/Lymphatic: Normal. Reproductive: Normal. Other: No free air. Musculoskeletal: Normal. IMPRESSION: 1. Acute diffuse pancreatitis with extensive peripancreatic fluid extending into the left upper quadrant and down the left pericolic gutter into the pelvis. 2. New hepatomegaly with marked progression of hepatic steatosis. Electronically Signed   By: Francene Boyers M.D.   On: 01/29/2015 13:55   Ir Embo Art  Peter Minium Hemorr Lymph [employer] Guide Roadmapping  02/10/2015  CLINICAL DATA:  Acute pancreatitis with retroperitoneal fluid collections, splenic vein thrombosis, on anticoagulation. Recent CT demonstrates new left retroperitoneal hematoma with multifocal regions of active extravasation. Significant blood loss requiring transfusion. EXAM: SELECTIVE VISCERAL ARTERIOGRAPHY; IR EMBO ART VEN HEMORR LYMPH EXTRAV INC GUIDE ROADMAPPING; ADDITIONAL ARTERIOGRAPHY; IR ULTRASOUND GUIDANCE VASC ACCESS RIGHT MEDICATIONS: Intravenous Fentanyl and Versed were administered as conscious sedation during continuous cardiorespiratory monitoring by the radiology RN, with a total moderate sedation time of 85 minutes. CONTRAST:  OMNIPAQUE IOHEXOL 300 MG/ML  SOLN FLUOROSCOPY TIME:  3.5 minutes, 48 51  uGym2 DAP PROCEDURE: The procedure, risks (including but not limited to bleeding, infection, organ damage ), benefits, and alternatives were explained to the patient. Questions regarding the procedure were encouraged and answered. The patient understands and consents to the procedure. Site was marked, prepped with Betadine, draped in usual sterile fashion, infiltrated locally with 1% lidocaine. Under ultrasound, right common femoral artery localized. Under real-time ultrasound guidance, the vessel was accessed with a 21-gauge  micropuncture needle, exchanged over a 018 guidewire for a transitional dilator, through which a 035 guidewire was advanced. A 5 French vascular sheath was placed. A 5 French C2 catheter advanced and used to selectively catheterize the splenic artery for selective arteriography. Catheter was then pulled back into the celiac axis for additional selective arteriography. Catheter then used to selectively catheterize the superior mesenteric artery for selective arteriography in multiple projections. The catheter was exchanged over the guidewire for a RIM catheter for additional SMA arteriography. The RIM was exchanged back for the C2, used to selectively catheterize the inferior mesenteric artery for selective arteriography in multiple projections. A renegade micro catheter was then coaxially advanced with the aid of a Fathom guidewire and used to selectively catheterize 3 different third order branches of the IMA for additional supra selective arteriography. Active extravasation at the level of the proximal descending colon was treated with 2 mm coil embolization. Follow-up arteriography was performed. The micro catheter was then redirected and coil embolization of a second site of active extravasation at the level of the distal descending colon was performed. Follow-up arteriography was performed. The micro catheter and C2 were then withdrawn. After confirmatory femoral arteriogram, the sheath was removed and hemostasis achieved with the aid of Exoseal device. The patient tolerated the procedure well. COMPLICATIONS: None immediate FINDINGS: Selective splenic and celiac arteriography demonstrated no active extravasation, pseudoaneurysm, or other unexpected arterial finding. 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 Selective superior mesenteric arteriography  demonstrated no active extravasation, pseudoaneurysm, or other vascular abnormality. The proper  hepatic artery originates from the gastroduodenal artery, an anatomic variant. Venous phase confirms patency of superior mesenteric vein and portal vein with antegrade flow. Inferior mesenteric arteriography demonstrates several extraluminal foci of active extravasation in the left retroperitoneum, corresponding to findings on recent CT. Subselective micro catheter arteriography localized the segmental supply to an area of active extravasation at the level of the proximal descending colon. This area was coil embolized, and follow-up arteriography demonstrated no further active extravasation. Sub selective micro catheter arteriography at the level of the sigmoid colon failed to localize the branches supplying the smaller site of active extravasation. Subselective micro catheter arteriography at the level of the distal descending colon localized a site of active extravasation, and once the supplying vessels were identified, septal sub selective coil Embolization was performed. Follow-up arteriography demonstrated no further extravasation in this region. IMPRESSION: 1. At least 3 separate foci of active extravasation in the left lower retroperitoneum from small branches of the inferior mesenteric artery circulation. 2. Technically successful coil embolization of 2 sites of active extravasation. Sub selective supply to the third site at the level of the sigmoid colon could not be localized. Critical Value/emergent results were discussed with Dr. Carolynne Edouard from surgery, who verbally acknowledged these results. 3. Splenic vein occlusion, as previously documented. Electronically Signed   By: Corlis Leak M.D.   On: 02/10/2015 16:23    Microbiology: Recent Results (from the past 240 hour(s))  Culture, blood (routine x 2)     Status: None (Preliminary result)   Collection Time: 02/16/15  4:06 PM  Result Value Ref Range Status   Specimen Description BLOOD RIGHT ARM  Final   Special Requests   Final    BOTTLES DRAWN AEROBIC  AND ANAEROBIC 10CC BOTH BOTTLES   Culture   Final    NO GROWTH 4 DAYS Performed at [name]    Report Status PENDING  Incomplete  Culture, blood (routine x 2)     Status: None (Preliminary result)   Collection Time: 02/16/15  4:20 PM  Result Value Ref Range Status   Specimen Description BLOOD LEFT ARM  Final   Special Requests   Final    BOTTLES DRAWN AEROBIC AND ANAEROBIC 10CC BOTH BOTTLES   Culture   Final    NO GROWTH 4 DAYS Performed at [facility]    Report Status PENDING  Incomplete     Labs: Basic Metabolic Panel:  Recent Labs Lab 02/17/15 0521 02/18/15 0448 02/19/15 0437 02/20/15 0429 02/21/15 0500  NA 138 138 136 137 135  K 3.2* 3.2* 3.8 3.6 3.3*  CL 105 103 99* 95* 95*  CO2 29 29 31  32 31  GLUCOSE 114* 119* 103* 113* 124*  BUN <5* <5* <5* <5* <5*  CREATININE 0.53 0.45 0.50 0.62 0.66  CALCIUM 8.4* 8.2* 8.3* 9.0 8.5*  MG 1.8  --   --   --   --    Liver Function Tests:  Recent Labs Lab 02/20/15 0429  AST 36  ALT 14  ALKPHOS 136*  BILITOT 1.2  PROT 7.4  ALBUMIN 2.6*   No results for input(s): LIPASE, AMYLASE in the last 168 hours. No results for input(s): AMMONIA in the last 168 hours. CBC:  Recent Labs Lab 02/16/15 2055 02/17/15 0521 02/18/15 0448 02/21/15 0500  WBC 17.6* 13.6* 10.4 10.4  HGB 10.3* 9.1* 8.8* 9.2*  HCT 31.7* 29.5* 27.7* 29.5*  MCV 95.8 97.4 98.9  99.0  PLT 562* 542* 476* 555*   Cardiac Enzymes: No results for input(s): CKTOTAL, CKMB, CKMBINDEX, TROPONINI in the last 168 hours. BNP: BNP (last 3 results)  Recent Labs  07/06/14 1255  BNP 79.2    ProBNP (last 3 results) No results for input(s): PROBNP in the last 8760 hours.  CBG: No results for input(s): GLUCAP in the last 168 hours.     SignedMauro Kaufmann S  [facility] 02/21/2015, 2:29 PM

## 2015-02-26 ENCOUNTER — Encounter: Payer: Self-pay | Admitting: *Deleted
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
## 2015-02-28 ENCOUNTER — Ambulatory Visit (INDEPENDENT_AMBULATORY_CARE_PROVIDER_SITE_OTHER): Payer: BC Managed Care – PPO | Admitting: Cardiovascular Disease

## 2015-02-28 ENCOUNTER — Encounter: Payer: Self-pay | Admitting: Cardiovascular Disease

## 2015-02-28 VITALS — BP 100/76 | HR 112 | Ht 65.0 in | Wt 199.2 lb

## 2015-02-28 DIAGNOSIS — T148XXA Other injury of unspecified body region, initial encounter: Secondary | ICD-10-CM

## 2015-02-28 DIAGNOSIS — R55 Syncope and collapse: Secondary | ICD-10-CM

## 2015-02-28 DIAGNOSIS — T148 Other injury of unspecified body region: Secondary | ICD-10-CM | POA: Diagnosis not present

## 2015-02-28 NOTE — Patient Instructions (Addendum)
Medication Instructions:  Your physician recommends that you continue on your current medications as directed. Please refer to the Current Medication list given to you today.  Labwork: NONE  Testing/Procedures: Your physician has requested that you have an exercise tolerance test. For further information please visit [email or web address]. Please also follow instruction sheet, as given.  Follow-Up: Your physician wants you to follow-up in: 3 months with Dr. Eden EmmsNishan.   If you need a refill on your cardiac medications before your next appointment, please call your pharmacy.

## 2015-03-06 ENCOUNTER — Ambulatory Visit (INDEPENDENT_AMBULATORY_CARE_PROVIDER_SITE_OTHER): Payer: BC Managed Care – PPO | Admitting: Internal Medicine

## 2015-03-06 ENCOUNTER — Encounter: Payer: Self-pay | Admitting: Internal Medicine

## 2015-03-06 VITALS — BP 118/83 | HR 112 | Temp 98.1°F | Ht 65.0 in | Wt 193.1 lb

## 2015-03-06 DIAGNOSIS — F1010 Alcohol abuse, uncomplicated: Secondary | ICD-10-CM

## 2015-03-06 DIAGNOSIS — R509 Fever, unspecified: Secondary | ICD-10-CM | POA: Diagnosis not present

## 2015-03-06 NOTE — Progress Notes (Signed)
   Subjective:    Patient ID: Kara [name], female    DOB: 02/14/1986, 29 y.o.   MRN: [id number]  HPI Kara [name] is a 29 y.o. female with alcohol pancreatitis, s/p splenic vein thrombosis, on anticoagulation readmitted for abdominal pain about 2 weeks ago. Noted multiple fluid collections c/w hemorrhage. Started emirically on imipenem and then anidulafungin but continued to have fever. WBC improved. I stopped antibiotics and she continued to improve.   Had continued fever and repeat CT scan done and hematoma slightly improved.  Now here afebrile. Working again, feels great.  Has not been taking any antibiotics, continues to improve.  Eating.     Review of Systems  Constitutional: Positive for fatigue. Negative for fever and chills.  Gastrointestinal: Negative for nausea and diarrhea.  Skin: Negative for rash.  Neurological: Negative for dizziness.       Objective:   Physical Exam  Constitutional: She appears well-developed and well-nourished. No distress.  Eyes: No scleral icterus.  Cardiovascular: Normal rate, regular rhythm and normal heart sounds.   No murmur heard. Pulmonary/Chest: Effort normal and breath sounds normal. No respiratory distress.  Skin: No rash noted.          Assessment & Plan:

## 2015-03-06 NOTE — Assessment & Plan Note (Signed)
Resovled, off of antibiotics and doing well.

## 2015-03-06 NOTE — Assessment & Plan Note (Signed)
She continues to be alcohol free 

## 2015-03-25 ENCOUNTER — Encounter: Payer: BC Managed Care – PPO | Admitting: Physician Assistant

## 2015-04-18 ENCOUNTER — Telehealth (HOSPITAL_COMMUNITY): Payer: Self-pay

## 2015-04-18 NOTE — Telephone Encounter (Signed)
Encounter complete. 

## 2015-04-23 ENCOUNTER — Ambulatory Visit (HOSPITAL_COMMUNITY)
Admission: RE | Admit: 2015-04-23 | Discharge: 2015-04-23 | Disposition: A | Discharge status: Home / Self Care | Payer: BC Managed Care – PPO | Source: Ambulatory Visit | Attending: Cardiovascular Disease | Admitting: Cardiovascular Disease

## 2015-04-23 DIAGNOSIS — R55 Syncope and collapse: Secondary | ICD-10-CM

## 2015-04-23 LAB — EXERCISE TOLERANCE TEST
CHL CUP MPHR: 190 {beats}/min
CSEPED: 7 min
CSEPEW: 8.5 METS
Peak HR: 184 {beats}/min
Percent HR: 96 %
RPE: 16
Rest HR: 126 {beats}/min

## 2015-05-13 ENCOUNTER — Encounter: Payer: Self-pay | Admitting: Allergy and Immunology

## 2015-05-13 ENCOUNTER — Ambulatory Visit (INDEPENDENT_AMBULATORY_CARE_PROVIDER_SITE_OTHER): Payer: BC Managed Care – PPO | Admitting: Allergy and Immunology

## 2015-05-13 VITALS — BP 100/72 | HR 88 | Resp 14

## 2015-05-13 DIAGNOSIS — K219 Gastro-esophageal reflux disease without esophagitis: Secondary | ICD-10-CM

## 2015-05-13 DIAGNOSIS — H1010 Acute atopic conjunctivitis, unspecified eye: Secondary | ICD-10-CM | POA: Diagnosis not present

## 2015-05-13 DIAGNOSIS — F419 Anxiety disorder, unspecified: Secondary | ICD-10-CM | POA: Diagnosis not present

## 2015-05-13 DIAGNOSIS — Z91018 Allergy to other foods: Secondary | ICD-10-CM | POA: Diagnosis not present

## 2015-05-13 DIAGNOSIS — J309 Allergic rhinitis, unspecified: Secondary | ICD-10-CM | POA: Diagnosis not present

## 2015-05-13 DIAGNOSIS — J4540 Moderate persistent asthma, uncomplicated: Secondary | ICD-10-CM
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
## 2015-05-13 NOTE — Patient Instructions (Signed)
  1. Continue Dulera 200 2 inhalations twice a day   2. Continue montelukast 10 mg one tablet once a day  3. Continue omeprazole 40 mg once a day  4. Continue Ventolin HFA if needed  5. Continue EpiPen if needed  6. Continue antihistamine if needed  7. Return to clinic in 1 year or earlier if problem

## 2015-05-15 ENCOUNTER — Other Ambulatory Visit: Payer: Self-pay | Admitting: Allergy and Immunology

## 2015-05-15 MED ORDER — MOMETASONE FURO-FORMOTEROL FUM 200-5 MCG/ACT IN AERO: 2.0000 | INHALATION_SPRAY | Freq: Two times a day (BID) | RESPIRATORY_TRACT | Status: DC

## 2015-05-15 MED ORDER — ALBUTEROL SULFATE HFA 108 (90 BASE) MCG/ACT IN AERS: 2.0000 | INHALATION_SPRAY | Freq: Four times a day (QID) | RESPIRATORY_TRACT | Status: DC | PRN

## 2015-05-15 MED ORDER — MONTELUKAST SODIUM 10 MG PO TABS: 10.0000 mg | ORAL_TABLET | Freq: Every day | ORAL | Status: DC

## 2015-05-15 MED ORDER — EPINEPHRINE 0.15 MG/0.15ML IJ SOAJ: 0.1500 mg | INTRAMUSCULAR | Status: DC | PRN

## 2015-05-15 MED ORDER — OMEPRAZOLE 40 MG PO CPDR: 40.0000 mg | DELAYED_RELEASE_CAPSULE | Freq: Every day | ORAL | Status: DC

## 2015-05-15 NOTE — Telephone Encounter (Signed)
Pt called and need rx called in because she is out. [facility] [address].    Continue Dulera 200 2 inhalations twice a day   2. Continue montelukast 10 mg one tablet once a day  3. Continue omeprazole 40 mg once a day  4. Continue Ventolin HFA if needed  5. Continue EpiPen if needed  6. Continue antihistamine if needed

## 2015-05-15 NOTE — Telephone Encounter (Signed)
Medications sent in and left voicemail for patient to contact us back to notify about medications.

## 2015-05-16 NOTE — Telephone Encounter (Signed)
Called and tried to leave another voicemail but patients voicemail box was full.

## 2015-05-19 NOTE — Telephone Encounter (Signed)
Tried to contact patient to advise about medications.  No answer.  States mailbox is full.

## 2015-06-02 NOTE — Progress Notes (Signed)
Patient ID: Kara [name], female   DOB: November 19, 1985, 30 y.o.   MRN: [id number]     Cardiology Office Note   Date:  06/09/2015   ID:  Kara [name], DOB 1986/03/09, MRN [id number]  PCP:   Duane Lope, MD  Cardiologist:   Charlton Haws, MD   Chief Complaint  Patient presents with  . Follow-up    F/U to SOB/hematomia      History of Present Illness: Kara [name] is a 30 y.o. female who presents for palpitations and syncope.  Seen in hospital 12/15  PMH of ETOH abuse and asthma who was brought to the hospital by her husband after having a syncopal episode after walking to the bathroom (had some nausea). She feels like she blacked out for a few seconds. No prodromal symptoms except for the sudden onset of nausea. Vomited after coming to the ED. No complaints of chest pains, shortness of breath or palpitations. The patient denies any recent medication changes, no recent similar symptoms. There is no family history of sudden death. She does report an episode of hematochezia over a week ago, and has not had any since that time. Upon initial evaluation in the ED, the patient's heart rate is 110-130 with a QTC greater than 600. Hemoglobin is normal at 13.8.  She has a very fragile social situation Father was and alcoholic and she is Has been to AA before  Drinks beer and whiskey.  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  He is a heroin addict.  She has seen a psychiatrist in past but not actively now.     Has reflux at at night and eats her major meal then  Related to palpitations   History of CAD in Dad and ? Sister but no sudden death.  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 She underwent IR guided embolization of the IMA branches.  Husband drinks and smokes at home  This is difficult for her given her addictions    Past Medical History  Diagnosis Date  . Asthma   . GERD (gastroesophageal reflux disease)   . Alcohol abuse   . Environmental allergies   . Headache   . Hypertension   . Syncope and collapse   . Vision abnormalities   . Prolonged QT syndrome   . Pancreatitis     Past Surgical History  Procedure Laterality Date  . Tympanostomy tube placement    . Esophagogastroduodenoscopy N/A 07/17/2014    Procedure: ESOPHAGOGASTRODUODENOSCOPY (EGD);  Surgeon: Dorena Cookey, MD;  Location: [facility];  Service: Endoscopy;  Laterality: N/A;     Current Outpatient Prescriptions  Medication Sig Dispense Refill  . albuterol (PROVENTIL HFA;VENTOLIN HFA) 108 (90 Base) MCG/ACT inhaler Inhale 2 puffs into the lungs every 6 (six) hours as needed for wheezing or shortness of breath (wheezing and sob). 1 Inhaler 1  . EPINEPHrine 0.15 MG/0.15ML IJ injection Inject 0.15 mLs (0.15 mg total) into the muscle as needed for anaphylaxis. 2 Device 1  . fexofenadine (ALLEGRA) 180 MG tablet Take 180 mg by mouth daily as needed for allergies (allergies). Reported on 05/13/2015    .  gabapentin (NEURONTIN) 300 MG capsule Take 300 mg by mouth 3 (three) times daily.    . metoprolol succinate (TOPROL-XL) 25 MG 24 hr tablet Take 1 tablet (25 mg total) by mouth daily. 30 tablet 11  . mometasone-formoterol (DULERA) 200-5 MCG/ACT AERO Inhale 2 puffs into the lungs 2 (two) times daily. 1 Inhaler 3  . montelukast (SINGULAIR) 10 MG tablet Take 1 tablet (10 mg total) by mouth daily. 30 tablet 3  . Multiple Vitamin (MULTIVITAMIN WITH MINERALS) TABS tablet Take 1 tablet by mouth daily. 30 tablet 0  . omeprazole (PRILOSEC) 40 MG capsule Take 1 capsule (40 mg total) by mouth daily. 30 capsule 5   No current  facility-administered medications for this visit.    Allergies:   Peanuts    Social History:  The patient  reports that she quit smoking about 3 years ago. Her smoking use included Cigarettes. She has a 1 pack-year smoking history. She has never used smokeless tobacco. She reports that she drinks about 1.8 oz of alcohol per week. She reports that she does not use illicit drugs.   Family History:  The patient's family history includes Heart failure in her father, maternal grandfather, and mother.    ROS:  Please see the history of present illness.   Otherwise, review of systems are positive for none.   All other systems are reviewed and negative.    PHYSICAL EXAM: VS:  BP 104/76 mmHg  Pulse 80  Ht 5\' 4"  (1.626 m)  Wt 89.413 kg (197 lb 1.9 oz)  BMI 33.82 kg/m2  SpO2 92% , BMI Body mass index is 33.82 kg/(m^2). GEN: Well nourished, well developed, in no acute distress HEENT: normal Neck: no JVD, carotid bruits, or masses Cardiac:  RRR; no murmurs, rubs, or gallops,no edema  Respiratory:  clear to auscultation bilaterally, normal work of breathing GI: soft, nontender, nondistended, + BS MS: no deformity or atrophy Skin: warm and dry, no rash Neuro:  Strength and sensation are intact Psych: euthymic mood, full affect   EKG:  12/ 21/15  ST rate 131  QT 434   12/22 /15 SR rate 92  QT 412  Normal  07/28/14    Recent Labs: 07/06/2014: B Natriuretic Peptide 79.2 07/16/2014: TSH 1.303 02/17/2015: Magnesium 1.8 02/20/2015: ALT 14 02/21/2015: BUN <5*; Creatinine, Ser 0.66; Hemoglobin 9.2*; Platelets 555*; Potassium 3.3*; Sodium 135    Lipid Panel No results found for: CHOL, TRIG, HDL, CHOLHDL, VLDL, LDLCALC, LDLDIRECT    Wt Readings from Last 3 Encounters:  06/09/15 89.413 kg (197 lb 1.9 oz)  03/06/15 87.599 kg (193 lb 1.9 oz)  02/28/15 90.357 kg (199 lb 3.2 oz)      Other studies Reviewed: Additional studies/ records that were reviewed today include: Hosptial admission records,  ECG;s 12/15 . ST rate 123 QT 342     ASSESSMENT AND PLAN:  1.  Syncope:  Etiology not clear. Normal ETT and Normal EF by echo  Observe  2. ETOH Abuse  Long discussion about possible effects on heart She has had resources at [facility] and psych before and encouraged her to reconnect through her primary denies recent use  Has not had drink since hospital d/c  3. Asthma  PRN inhaler may be best to change to xopenex for palpitations  4. Reflux:  Discussed low carb diet eating meals earlier and blocks under bed   Current medicines are reviewed at length with the patient today.  The patient does not have concerns regarding medicines.  The following changes have been made:  no change  Labs/ tests ordered today include:     No orders of the defined types were placed in this encounter.     Disposition:   FU with me 6 months     Signed, Charlton Haws, MD  06/09/2015 10:46 AM    [facility] [address] [place], [place], [place]  [address] Phone: [phone]; Fax: [phone]

## 2015-06-09 ENCOUNTER — Encounter: Payer: Self-pay | Admitting: Cardiovascular Disease

## 2015-06-09 ENCOUNTER — Ambulatory Visit (INDEPENDENT_AMBULATORY_CARE_PROVIDER_SITE_OTHER): Payer: BC Managed Care – PPO | Admitting: Cardiovascular Disease

## 2015-06-09 VITALS — BP 104/76 | HR 80 | Ht 64.0 in | Wt 197.1 lb

## 2015-06-09 DIAGNOSIS — Z09 Encounter for follow-up examination after completed treatment for conditions other than malignant neoplasm: Secondary | ICD-10-CM

## 2015-06-09 MED ORDER — METOPROLOL SUCCINATE ER 25 MG PO TB24: 25.0000 mg | ORAL_TABLET | Freq: Every day | ORAL | Status: DC

## 2015-06-09 NOTE — Patient Instructions (Addendum)

## 2015-09-09 ENCOUNTER — Encounter (HOSPITAL_COMMUNITY): Payer: Self-pay | Admitting: Emergency Medicine

## 2015-09-09 ENCOUNTER — Emergency Department (HOSPITAL_COMMUNITY): Payer: BC Managed Care – PPO

## 2015-09-09 ENCOUNTER — Inpatient Hospital Stay (HOSPITAL_COMMUNITY)
Admission: EM | Admit: 2015-09-09 | Discharge: 2015-09-11 | DRG: 440 | Disposition: A | Discharge status: Home / Self Care | Payer: BC Managed Care – PPO | Attending: Internal Medicine | Admitting: Internal Medicine

## 2015-09-09 DIAGNOSIS — F1010 Alcohol abuse, uncomplicated: Secondary | ICD-10-CM | POA: Diagnosis present

## 2015-09-09 DIAGNOSIS — K858 Other acute pancreatitis without necrosis or infection: Secondary | ICD-10-CM | POA: Diagnosis not present

## 2015-09-09 DIAGNOSIS — E669 Obesity, unspecified: Secondary | ICD-10-CM | POA: Diagnosis present

## 2015-09-09 DIAGNOSIS — R9431 Abnormal electrocardiogram [ECG] [EKG]: Secondary | ICD-10-CM

## 2015-09-09 DIAGNOSIS — K859 Acute pancreatitis without necrosis or infection, unspecified: Secondary | ICD-10-CM | POA: Diagnosis present

## 2015-09-09 DIAGNOSIS — R109 Unspecified abdominal pain: Secondary | ICD-10-CM | POA: Diagnosis not present

## 2015-09-09 DIAGNOSIS — I10 Essential (primary) hypertension: Secondary | ICD-10-CM | POA: Diagnosis not present

## 2015-09-09 DIAGNOSIS — K861 Other chronic pancreatitis: Secondary | ICD-10-CM | POA: Diagnosis present

## 2015-09-09 DIAGNOSIS — Z79899 Other long term (current) drug therapy: Secondary | ICD-10-CM

## 2015-09-09 DIAGNOSIS — K219 Gastro-esophageal reflux disease without esophagitis: Secondary | ICD-10-CM | POA: Diagnosis not present

## 2015-09-09 DIAGNOSIS — I4581 Long QT syndrome: Secondary | ICD-10-CM | POA: Diagnosis present

## 2015-09-09 DIAGNOSIS — F17210 Nicotine dependence, cigarettes, uncomplicated: Secondary | ICD-10-CM | POA: Diagnosis present

## 2015-09-09 DIAGNOSIS — K852 Alcohol induced acute pancreatitis without necrosis or infection: Secondary | ICD-10-CM | POA: Diagnosis not present

## 2015-09-09 DIAGNOSIS — J45909 Unspecified asthma, uncomplicated: Secondary | ICD-10-CM | POA: Diagnosis present

## 2015-09-09 DIAGNOSIS — Z6832 Body mass index (BMI) 32.0-32.9, adult: Secondary | ICD-10-CM

## 2015-09-09 LAB — CBC
HCT: 50.2 % — ABNORMAL HIGH (ref 36.0–46.0)
HCT: 45 % (ref 36.0–46.0)
HEMOGLOBIN: 17.8 g/dL — AB (ref 12.0–15.0)
HEMOGLOBIN: 16.2 g/dL — AB (ref 12.0–15.0)
MCH: 33.5 pg (ref 26.0–34.0)
MCH: 33.5 pg (ref 26.0–34.0)
MCHC: 35.5 g/dL (ref 30.0–36.0)
MCHC: 36 g/dL (ref 30.0–36.0)
MCV: 94.4 fL (ref 78.0–100.0)
MCV: 93 fL (ref 78.0–100.0)
PLATELETS: 270 10*3/uL (ref 150–400)
Platelets: 207 10*3/uL (ref 150–400)
RBC: 5.32 MIL/uL — ABNORMAL HIGH (ref 3.87–5.11)
RBC: 4.84 MIL/uL (ref 3.87–5.11)
RDW: 13.9 % (ref 11.5–15.5)
RDW: 13.9 % (ref 11.5–15.5)
WBC: 15 10*3/uL — ABNORMAL HIGH (ref 4.0–10.5)
WBC: 13.2 10*3/uL — ABNORMAL HIGH (ref 4.0–10.5)

## 2015-09-09 LAB — URINALYSIS, ROUTINE W REFLEX MICROSCOPIC
BILIRUBIN URINE: NEGATIVE
Glucose, UA: NEGATIVE mg/dL
KETONES UR: NEGATIVE mg/dL
Leukocytes, UA: NEGATIVE
NITRITE: NEGATIVE
PROTEIN: 30 mg/dL — AB
pH: 6 (ref 5.0–8.0)

## 2015-09-09 LAB — PHOSPHORUS: PHOSPHORUS: 2.7 mg/dL (ref 2.5–4.6)

## 2015-09-09 LAB — COMPREHENSIVE METABOLIC PANEL
ALBUMIN: 3.8 g/dL (ref 3.5–5.0)
ALK PHOS: 112 U/L (ref 38–126)
ALT: 41 U/L (ref 14–54)
ANION GAP: 10 (ref 5–15)
AST: 35 U/L (ref 15–41)
BILIRUBIN TOTAL: 1.3 mg/dL — AB (ref 0.3–1.2)
BUN: 7 mg/dL (ref 6–20)
CALCIUM: 9.3 mg/dL (ref 8.9–10.3)
CO2: 20 mmol/L — ABNORMAL LOW (ref 22–32)
CREATININE: 0.61 mg/dL (ref 0.44–1.00)
Chloride: 102 mmol/L (ref 101–111)
GFR calc Af Amer: >60 mL/min (ref >60)
GFR calc non Af Amer: >60 mL/min (ref >60)
GLUCOSE: 137 mg/dL — AB (ref 65–99)
Potassium: 3.9 mmol/L (ref 3.5–5.1)
Sodium: 132 mmol/L — ABNORMAL LOW (ref 135–145)
TOTAL PROTEIN: 7.4 g/dL (ref 6.5–8.1)

## 2015-09-09 LAB — I-STAT BETA HCG BLOOD, ED (MC, WL, AP ONLY)

## 2015-09-09 LAB — LIPID PANEL
CHOL/HDL RATIO: 3.1 ratio
CHOLESTEROL: 174 mg/dL (ref 0–200)
HDL: 57 mg/dL (ref >40)
LDL Cholesterol: 100 mg/dL — ABNORMAL HIGH (ref 0–99)
Triglycerides: 87 mg/dL (ref <150)
VLDL: 17 mg/dL (ref 0–40)

## 2015-09-09 LAB — LIPASE, BLOOD: Lipase: 619 U/L — ABNORMAL HIGH (ref 11–51)

## 2015-09-09 LAB — URINE MICROSCOPIC-ADD ON

## 2015-09-09 LAB — CREATININE, SERUM
CREATININE: 0.7 mg/dL (ref 0.44–1.00)
GFR calc Af Amer: >60 mL/min (ref >60)

## 2015-09-09 LAB — MAGNESIUM: Magnesium: 1.6 mg/dL — ABNORMAL LOW (ref 1.7–2.4)

## 2015-09-09 MED ORDER — ONDANSETRON HCL 4 MG/2ML IJ SOLN: 4.0000 mg | Freq: Four times a day (QID) | INTRAMUSCULAR | Status: DC | PRN

## 2015-09-09 MED ORDER — MONTELUKAST SODIUM 10 MG PO TABS
10.0000 mg | ORAL_TABLET | Freq: Every day | ORAL | Status: DC
  Administered 2015-09-09 – 2015-09-11 (×3): 10 mg via ORAL
  Filled 2015-09-09 (×3): qty 1

## 2015-09-09 MED ORDER — ONDANSETRON HCL 4 MG PO TABS: 4.0000 mg | ORAL_TABLET | Freq: Four times a day (QID) | ORAL | Status: DC | PRN

## 2015-09-09 MED ORDER — ADULT MULTIVITAMIN W/MINERALS CH
1.0000 | ORAL_TABLET | Freq: Every day | ORAL | Status: DC
Start: 2015-09-09 — End: 2015-09-11
  Administered 2015-09-09 – 2015-09-11 (×3): 1 via ORAL
  Filled 2015-09-09 (×3): qty 1

## 2015-09-09 MED ORDER — LORAZEPAM 1 MG PO TABS
1.0000 mg | ORAL_TABLET | Freq: Four times a day (QID) | ORAL | Status: DC | PRN
  Administered 2015-09-10: 1 mg via ORAL
  Filled 2015-09-09: qty 1

## 2015-09-09 MED ORDER — SODIUM CHLORIDE 0.9% FLUSH: 3.0000 mL | Freq: Two times a day (BID) | INTRAVENOUS | Status: DC

## 2015-09-09 MED ORDER — HEPARIN SODIUM (PORCINE) 5000 UNIT/ML IJ SOLN
5000.0000 [IU] | Freq: Three times a day (TID) | INTRAMUSCULAR | Status: DC
  Administered 2015-09-09 – 2015-09-11 (×6): 5000 [IU] via SUBCUTANEOUS
  Filled 2015-09-09 (×6): qty 1

## 2015-09-09 MED ORDER — BUDESONIDE 0.25 MG/2ML IN SUSP
0.2500 mg | Freq: Two times a day (BID) | RESPIRATORY_TRACT | Status: DC
  Filled 2015-09-09: qty 2

## 2015-09-09 MED ORDER — HYDROMORPHONE HCL 1 MG/ML IJ SOLN
1.0000 mg | INTRAMUSCULAR | Status: DC | PRN
  Administered 2015-09-09: 1 mg via INTRAVENOUS
  Filled 2015-09-09: qty 1

## 2015-09-09 MED ORDER — VITAMIN B-1 100 MG PO TABS
100.0000 mg | ORAL_TABLET | Freq: Every day | ORAL | Status: DC
  Administered 2015-09-09 – 2015-09-11 (×3): 100 mg via ORAL
  Filled 2015-09-09 (×2): qty 1

## 2015-09-09 MED ORDER — LORAZEPAM 2 MG/ML IJ SOLN: 1.0000 mg | Freq: Four times a day (QID) | INTRAMUSCULAR | Status: DC | PRN

## 2015-09-09 MED ORDER — SODIUM CHLORIDE 0.9 % IV SOLN
Freq: Once | INTRAVENOUS | Status: AC
  Administered 2015-09-09: 14:00:00 via INTRAVENOUS

## 2015-09-09 MED ORDER — SODIUM CHLORIDE 0.9 % IV BOLUS (SEPSIS)
1000.0000 mL | Freq: Once | INTRAVENOUS | Status: AC
  Administered 2015-09-09: 1000 mL via INTRAVENOUS

## 2015-09-09 MED ORDER — HYDROMORPHONE HCL 1 MG/ML IJ SOLN
1.0000 mg | Freq: Once | INTRAMUSCULAR | Status: AC
  Administered 2015-09-09: 1 mg via INTRAVENOUS
  Filled 2015-09-09: qty 1

## 2015-09-09 MED ORDER — FOLIC ACID 1 MG PO TABS
1.0000 mg | ORAL_TABLET | Freq: Every day | ORAL | Status: DC
  Administered 2015-09-09 – 2015-09-11 (×3): 1 mg via ORAL
  Filled 2015-09-09 (×3): qty 1

## 2015-09-09 MED ORDER — HYDROMORPHONE HCL 1 MG/ML IJ SOLN
INTRAMUSCULAR | Status: AC
  Filled 2015-09-09: qty 1

## 2015-09-09 MED ORDER — METOCLOPRAMIDE HCL 5 MG/ML IJ SOLN
10.0000 mg | Freq: Once | INTRAMUSCULAR | Status: AC
  Administered 2015-09-09: 10 mg via INTRAVENOUS
  Filled 2015-09-09: qty 2

## 2015-09-09 MED ORDER — ACETAMINOPHEN 325 MG PO TABS: 650.0000 mg | ORAL_TABLET | Freq: Four times a day (QID) | ORAL | Status: DC | PRN

## 2015-09-09 MED ORDER — LORATADINE 10 MG PO TABS
10.0000 mg | ORAL_TABLET | Freq: Every day | ORAL | Status: DC
  Administered 2015-09-10 – 2015-09-11 (×2): 10 mg via ORAL
  Filled 2015-09-09 (×2): qty 1

## 2015-09-09 MED ORDER — HYDROMORPHONE HCL 1 MG/ML IJ SOLN
1.0000 mg | INTRAMUSCULAR | Status: DC | PRN
  Administered 2015-09-09 – 2015-09-11 (×10): 1 mg via INTRAVENOUS
  Filled 2015-09-09 (×10): qty 1

## 2015-09-09 MED ORDER — ACETAMINOPHEN 650 MG RE SUPP: 650.0000 mg | Freq: Four times a day (QID) | RECTAL | Status: DC | PRN

## 2015-09-09 MED ORDER — OXYCODONE HCL 5 MG PO TABS
5.0000 mg | ORAL_TABLET | ORAL | Status: DC | PRN
  Administered 2015-09-09 – 2015-09-11 (×4): 5 mg via ORAL
  Filled 2015-09-09 (×4): qty 1

## 2015-09-09 MED ORDER — GABAPENTIN 300 MG PO CAPS
300.0000 mg | ORAL_CAPSULE | Freq: Three times a day (TID) | ORAL | Status: DC
  Administered 2015-09-09 – 2015-09-11 (×7): 300 mg via ORAL
  Filled 2015-09-09 (×7): qty 1

## 2015-09-09 MED ORDER — FAMOTIDINE 20 MG PO TABS
20.0000 mg | ORAL_TABLET | Freq: Every day | ORAL | Status: DC
  Administered 2015-09-09 – 2015-09-10 (×2): 20 mg via ORAL
  Filled 2015-09-09 (×2): qty 1

## 2015-09-09 MED ORDER — THIAMINE HCL 100 MG/ML IJ SOLN: 100.0000 mg | Freq: Every day | INTRAMUSCULAR | Status: DC

## 2015-09-09 MED ORDER — SODIUM CHLORIDE 0.9 % IV SOLN
INTRAVENOUS | Status: DC
  Administered 2015-09-09 – 2015-09-11 (×4): via INTRAVENOUS

## 2015-09-09 MED ORDER — METOPROLOL SUCCINATE ER 25 MG PO TB24
25.0000 mg | ORAL_TABLET | Freq: Every day | ORAL | Status: DC
  Administered 2015-09-09 – 2015-09-11 (×3): 25 mg via ORAL
  Filled 2015-09-09 (×3): qty 1

## 2015-09-09 NOTE — H&P (Signed)
History and Physical    Kara [name] ZOX:[id number] DOB: Feb 04, 1986 DOA: 09/09/2015  Referring Provider: Trixie Dredge, PA-C PCP:  Duane Lope, MD   Patient coming from: Home  Chief Complaint: Abdominal pain, nausea and vomiting  HPI: Kara [name] is a 30 y.o. female with PMH significant for alcohol abuse, asthma, GERD, hypertension and  prior history of pancreatitis; who came to the emergency department secondary to abdominal pain with associated nausea and vomiting. Patient reports drinking some beers over the weekend with husband and friends; and subsequent has developed many abdominal discomfort for with associated nausea and vomiting. Patient reports that the pain is in the middle of her abdomen and radiates towards her back; 8/10 in intensity; [place] but with periods of worsening cramping sensation. Associated anorexia, nausea, vomiting (without blood) and not alleviated factors. Patient denies fever, chills, chest pain, shortness of breath, headaches, blurred patient, focal deficit, hematuria, dysuria, melena and hematochezia.  ED Course: Elevated lipase (619) and CT scan of her abdomen that demonstrated changes consistent with acute pancreatitis. Patient received pain medication, antiemetics and was initiated on IV fluids. [facility] hospitalist contacted to admit the patient for further evaluation and treatment.  Review of Systems:  All other systems reviewed and apart from HPI, are negative.  Past Medical History  Diagnosis Date  . Asthma   . GERD (gastroesophageal reflux disease)   . Alcohol abuse   . Environmental allergies   . Headache   . Hypertension   . Syncope and collapse   . Vision abnormalities   . Prolonged QT syndrome   . Pancreatitis     Past Surgical History  Procedure Laterality Date  . Tympanostomy tube placement    . Esophagogastroduodenoscopy N/A 07/17/2014    Procedure: ESOPHAGOGASTRODUODENOSCOPY (EGD);  Surgeon: Dorena Cookey, MD;  Location: [facility];   Service: Endoscopy;  Laterality: N/A;     reports that she has been smoking Cigarettes.  She has a 1 pack-year smoking history. She has never used smokeless tobacco. She reports that she drinks about 1.8 oz of alcohol per week. She reports that she does not use illicit drugs.  Allergies  Allergen Reactions  . Peanuts [Peanut Oil] Itching    Sensitivity     Family History  Problem Relation Age of Onset  . Heart failure Mother   . Heart failure Father   . Heart failure Maternal Grandfather      Prior to Admission medications   Medication Sig Start Date End Date Taking? Authorizing Provider  albuterol (PROVENTIL HFA;VENTOLIN HFA) 108 (90 Base) MCG/ACT inhaler Inhale 2 puffs into the lungs every 6 (six) hours as needed for wheezing or shortness of breath (wheezing and sob). 05/15/15  Yes Jessica Priest, MD  beclomethasone (QVAR) 40 MCG/ACT inhaler Inhale 2 puffs into the lungs 2 (two) times daily.   Yes Historical Provider, MD  fexofenadine (ALLEGRA) 180 MG tablet Take 180 mg by mouth daily as needed for allergies (allergies). Reported on 05/13/2015   Yes Historical Provider, MD  gabapentin (NEURONTIN) 300 MG capsule Take 300 mg by mouth 3 (three) times daily.   Yes Historical Provider, MD  metoprolol succinate (TOPROL-XL) 25 MG 24 hr tablet Take 1 tablet (25 mg total) by mouth daily. 06/09/15  Yes Wendall Stade, MD  mometasone-formoterol [facility]) 200-5 MCG/ACT AERO Inhale 2 puffs into the lungs 2 (two) times daily. 05/15/15  Yes Jessica Priest, MD  montelukast (SINGULAIR) 10 MG tablet Take 1 tablet (10 mg total) by mouth daily. 05/15/15  Yes Jessica Priest, MD  Multiple Vitamin (MULTIVITAMIN WITH MINERALS) TABS tablet Take 1 tablet by mouth daily. 07/19/14  Yes Belkys A Regalado, MD  omeprazole (PRILOSEC) 40 MG capsule Take 1 capsule (40 mg total) by mouth daily. 05/15/15  Yes Jessica Priest, MD  EPINEPHrine 0.15 MG/0.15ML IJ injection Inject 0.15 mLs (0.15 mg total) into the muscle as needed for  anaphylaxis. 05/15/15   Jessica Priest, MD    Physical Exam: Filed Vitals:   09/09/15 0800 09/09/15 1029 09/09/15 1115 09/09/15 1232  BP: 141/100 126/93  135/100  Pulse: 94 104 73 93  Temp:    98 F (36.7 C)  TempSrc:    Oral  Resp: Height:     (1.651 m)  SpO2: 100% 100% 94% 99%   Constitutional: In mild distress secondary to abdominal pain, patient expressed some nausea but no vomiting appreciated. She is afebrile and denies chest pain and shortness of breath. Oriented 3 and cooperative to examination. Eyes: PERRLA, lids and conjunctivae normal; no nystagmus, no icterus ENMT: Mucous membranes are moist. Posterior pharynx clear of any exudate or lesions. Normal dentition.  Neck: normal, supple, no masses, no thyromegaly, no JVD Respiratory: clear to auscultation bilaterally, no wheezing, no crackles. Normal respiratory effort. No accessory muscle use.  Cardiovascular: S1 & S2 heard, regular rate and rhythm, no murmurs / rubs / gallops. No extremity edema. 2+ pedal pulses. No carotid bruits.  Abdomen: No distension, Soft, no guarding, positive bowel sounds. Tender to palpation mid abdomen and right deviated to bilateral flanks. No masses appreciated.  Musculoskeletal: no clubbing / cyanosis. No joint deformity upper and lower extremities. Good ROM, no contractures. Normal muscle tone.  Skin: Multiple tattoos , no open ulcers, son abrasions and bruises (according to patient secondary to outdoor activities). No induration Neurologic: CN 2-12 grossly intact. Sensation intact, DTR normal. Strength 5/5 in all 4 limbs.  Psychiatric: Normal judgment and insight. Alert and oriented x 3. Normal mood.   Labs on Admission: I have personally reviewed following labs and imaging studies  CBC:  Recent Labs Lab 09/09/15 0522  WBC 15.0*  HGB 17.8*  HCT 50.2*  MCV 94.4  PLT 270   Basic Metabolic Panel:  Recent Labs Lab 09/09/15 0522  NA 132*  K 3.9  CL 102  CO2 20*    GLUCOSE 137*  BUN 7  CREATININE 0.61  CALCIUM 9.3   GFR: CrCl cannot be calculated (Unknown ideal weight.).   Liver Function Tests:  Recent Labs Lab 09/09/15 0522  AST 35  ALT 41  ALKPHOS 112  BILITOT 1.3*  PROT 7.4  ALBUMIN 3.8    Recent Labs Lab 09/09/15 0522  LIPASE 619*   Urine analysis:    Component Value Date/Time   COLORURINE YELLOW 09/09/2015 1336   APPEARANCEUR CLEAR 09/09/2015 1336   LABSPEC >1.046* 09/09/2015 1336   PHURINE 6.0 09/09/2015 1336   GLUCOSEU NEGATIVE 09/09/2015 1336   HGBUR TRACE* 09/09/2015 1336   BILIRUBINUR NEGATIVE 09/09/2015 1336   KETONESUR NEGATIVE 09/09/2015 1336   PROTEINUR 30* 09/09/2015 1336   UROBILINOGEN 1.0 02/16/2015 1231   NITRITE NEGATIVE 09/09/2015 1336   LEUKOCYTESUR NEGATIVE 09/09/2015 1336   Radiological Exams on Admission: Ct Abdomen Pelvis W Contrast  09/09/2015  CLINICAL DATA:  Pancreatitis. Abdominal pain, nausea, and vomiting beginning yesterday. EXAM: CT ABDOMEN AND PELVIS WITH CONTRAST TECHNIQUE: Multidetector CT imaging of the abdomen and pelvis was performed using the standard protocol following bolus administration  of intravenous contrast. CONTRAST:  100 mL Isovue-300 COMPARISON:  02/21/2015 FINDINGS: Minimal dependent atelectasis is noted in the right lower lobe. There is no pleural effusion. Diffusely decreased attenuation of the liver is consistent with steatosis. The liver is enlarged, measuring 20 cm in craniocaudal length. There is no biliary dilatation. The gallbladder, spleen, adrenal glands, and kidneys are unremarkable. There is new, moderate diffuse peripancreatic stranding and small volume fluid. The fluid collection along the superior aspect of the pancreatic tail on the prior study has decreased in size, with a residual more complex collection or focal phlegmonous change in this location measuring 2.9 x 2.5 x 2.2 cm. The pancreatic parenchyma enhances without evidence of necrosis. A small amount of  fluid is partially visualized in the distal esophagus and may reflect reflux. Slight prominence of the proximal jejunum may reflect very mild focal ileus due to the adjacent pancreatic inflammation. Oral contrast is present in multiple loops of nondilated small bowel more distally without evidence of mechanical obstruction. The colon is largely decompressed. Small volume intraperitoneal free fluid is present in the abdomen and pelvis. Left-sided retroperitoneal hematoma has greatly decreased in size, now measuring 6.9 x 4.3 x 10.6 cm (previously 11.1 x 9.2 x 20.1 cm). The hematoma is now more uniformly low in density reflecting old blood products. Adjacent embolization coils are again noted. 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 The SMV and portal vein are patent. The uterus and ovaries are unremarkable, with a benign-appearing 3.2 cm cyst on the right. The bladder is unremarkable. No enlarged lymph nodes are identified. No acute osseous abnormality is seen. IMPRESSION: 1. Acute pancreatitis with moderate peripancreatic inflammation. 3 cm complex fluid collection or focal phlegmonous change adjacent to the pancreatic tail, smaller than the simpler appearing collection on the prior CT. 2. 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 3. Hepatic steatosis. Electronically Signed   By: Sebastian AcheAllen  Grady M.D.   On: 09/09/2015 10:15    EKG:  QT 470, sinus rhythm, no acute ischemic changes.  Assessment/Plan 1-Pancreatitis: Appears to be secondary to alcohol -Patient will be admitted to telemetry bed (secondary to prolonged QT history and need of antiemetics medications, that could prolonged her QT interval even more) -Bowel rest -IV fluids, when necessary antiemetics and analgesics -Will follow clinical response -Will also check lipid profile. -Patient advise to quit drinking. -Will repeat lipase in a.m.  2-Prolonged Q-T interval on ECG: -Stable in comparison to prior tracing -Will observe on  telemetry, especially with meat of antiemetics as part of the treatment for her acute pancreatitis.  3-Alcohol abuse: -Cessation counseling has been provided -CIWA protocol initiated -Will use thiamine and folic acid  4-Obesity (BMI [phone]): -Low calorie diet and increase exercise has been discussed with the patient -Patient was noticed to be motivated and is looking to make lifestyle changes.  5-GERD (gastroesophageal reflux disease): -will use pepcid   6-Benign essential HTN: -Continue metoprolol -Will monitor her vital signs and adjust antihypertensive regimen as needed.   Time: 60 minutes   DVT prophylaxis: Heparin Code Status: Full code Family Communication: No family bedside  Disposition Plan: Home when medically stable; anticipate less than 2 midnights of admission Consults called: None Admission status: Observation, length of stay less than 2 midnights; telemetry bed   Vassie LollMadera, Chez Bulnes MD [facility] Pager [phone]  If 7PM-7AM, please contact night-coverage [email or web address] Password [email or web address]  09/09/2015, 5:08 PM

## 2015-09-09 NOTE — ED Notes (Signed)
Pt can go to floor at 11:20 

## 2015-09-09 NOTE — ED Provider Notes (Signed)
CSN: [id number]     Arrival date & time 09/09/15  0456 History   First MD Initiated Contact with Patient 09/09/15 [phone]     Chief Complaint  Patient presents with  . Abdominal Pain     (Consider location/radiation/quality/duration/timing/severity/associated sxs/prior Treatment) The history is provided by the patient.     Patient with hx pancreatitis with pseudocyst, alcohol abuse, prolonged QT p/w epigastric pain, N/V, decreased appetite that began yesterday morning at 2am.  Denies fevers, CP, SOB, cough, urinary or bowel changes.  Has had abdominal surgery for retroperitoneal hematoma.  Used to drink more heavily, reports drinking 2 beers 3 days ago.    Past Medical History  Diagnosis Date  . Asthma   . GERD (gastroesophageal reflux disease)   . Alcohol abuse   . Environmental allergies   . Headache   . Hypertension   . Syncope and collapse   . Vision abnormalities   . Prolonged QT syndrome   . Pancreatitis    Past Surgical History  Procedure Laterality Date  . Tympanostomy tube placement    . Esophagogastroduodenoscopy N/A 07/17/2014    Procedure: ESOPHAGOGASTRODUODENOSCOPY (EGD);  Surgeon: Dorena Cookey, MD;  Location: [facility];  Service: Endoscopy;  Laterality: N/A;   Family History  Problem Relation Age of Onset  . Heart failure Mother   . Heart failure Father   . Heart failure Maternal Grandfather    Social History  Substance Use Topics  . Smoking status: Current Every Day Smoker -- 0.50 packs/day for 2 years    Types: Cigarettes    Last Attempt to Quit: 04/08/2012  . Smokeless tobacco: Never Used  . Alcohol Use: 1.8 oz/week    3 Shots of liquor, 0 Standard drinks or equivalent per week   OB History    No data available     Review of Systems  All other systems reviewed and are negative.     Allergies  Peanuts  Home Medications   Prior to Admission medications   Medication Sig Start Date End Date Taking? Authorizing Provider  albuterol (PROVENTIL  HFA;VENTOLIN HFA) 108 (90 Base) MCG/ACT inhaler Inhale 2 puffs into the lungs every 6 (six) hours as needed for wheezing or shortness of breath (wheezing and sob). 05/15/15  Yes Jessica Priest, MD  beclomethasone (QVAR) 40 MCG/ACT inhaler Inhale 2 puffs into the lungs 2 (two) times daily.   Yes Historical Provider, MD  fexofenadine (ALLEGRA) 180 MG tablet Take 180 mg by mouth daily as needed for allergies (allergies). Reported on 05/13/2015   Yes Historical Provider, MD  gabapentin (NEURONTIN) 300 MG capsule Take 300 mg by mouth 3 (three) times daily.   Yes Historical Provider, MD  metoprolol succinate (TOPROL-XL) 25 MG 24 hr tablet Take 1 tablet (25 mg total) by mouth daily. 06/09/15  Yes Wendall Stade, MD  mometasone-formoterol [facility]) 200-5 MCG/ACT AERO Inhale 2 puffs into the lungs 2 (two) times daily. 05/15/15  Yes Jessica Priest, MD  montelukast (SINGULAIR) 10 MG tablet Take 1 tablet (10 mg total) by mouth daily. 05/15/15  Yes Jessica Priest, MD  Multiple Vitamin (MULTIVITAMIN WITH MINERALS) TABS tablet Take 1 tablet by mouth daily. 07/19/14  Yes Belkys A Regalado, MD  omeprazole (PRILOSEC) 40 MG capsule Take 1 capsule (40 mg total) by mouth daily. 05/15/15  Yes Jessica Priest, MD  EPINEPHrine 0.15 MG/0.15ML IJ injection Inject 0.15 mLs (0.15 mg total) into the muscle as needed for anaphylaxis. 05/15/15   Jessica Priest, MD  BP 124/100 mmHg  Pulse 130  Temp(Src) 97.9 F (36.6 C) (Oral)  Resp 22  SpO2 98%  LMP 08/24/2015 (Approximate) Physical Exam  Constitutional: She appears well-developed and well-nourished. No distress.  HENT:  Head: Normocephalic and atraumatic.  Neck: Neck supple.  Cardiovascular: Normal rate and regular rhythm.   Pulmonary/Chest: Effort normal and breath sounds normal. No respiratory distress. She has no wheezes. She has no rales.  Abdominal: Soft. She exhibits no distension. There is tenderness (epigastric). There is no rebound and no guarding.  Neurological: She is  alert.  Skin: She is not diaphoretic.  Nursing note and vitals reviewed.   ED Course  Procedures (including critical care time) Labs Review Labs Reviewed  LIPASE, BLOOD - Abnormal; Notable for the following:    Lipase 619 (*)    All other components within normal limits  COMPREHENSIVE METABOLIC PANEL - Abnormal; Notable for the following:    Sodium 132 (*)    CO2 20 (*)    Glucose, Bld 137 (*)    Total Bilirubin 1.3 (*)    All other components within normal limits  CBC - Abnormal; Notable for the following:    WBC 15.0 (*)    RBC 5.32 (*)    Hemoglobin 17.8 (*)    HCT 50.2 (*)    All other components within normal limits  URINALYSIS, ROUTINE W REFLEX MICROSCOPIC (NOT AT [facility]) - Abnormal; Notable for the following:    Specific Gravity, Urine >1.046 (*)    Hgb urine dipstick TRACE (*)    Protein, ur 30 (*)    All other components within normal limits  URINE MICROSCOPIC-ADD ON - Abnormal; Notable for the following:    Squamous Epithelial / LPF 0-5 (*)    Bacteria, UA FEW (*)    All other components within normal limits  I-STAT BETA HCG BLOOD, ED ([facility], [facility], [facility] ONLY)    Imaging Review Ct Abdomen Pelvis W Contrast  09/09/2015  CLINICAL DATA:  Pancreatitis. Abdominal pain, nausea, and vomiting beginning yesterday. EXAM: CT ABDOMEN AND PELVIS WITH CONTRAST TECHNIQUE: Multidetector CT imaging of the abdomen and pelvis was performed using the standard protocol following bolus administration of intravenous contrast. CONTRAST:  100 mL Isovue-300 COMPARISON:  02/21/2015 FINDINGS: Minimal dependent atelectasis is noted in the right lower lobe. There is no pleural effusion. Diffusely decreased attenuation of the liver is consistent with steatosis. The liver is enlarged, measuring 20 cm in craniocaudal length. There is no biliary dilatation. The gallbladder, spleen, adrenal glands, and kidneys are unremarkable. There is new, moderate diffuse peripancreatic stranding and small volume fluid. The  fluid collection along the superior aspect of the pancreatic tail on the prior study has decreased in size, with a residual more complex collection or focal phlegmonous change in this location measuring 2.9 x 2.5 x 2.2 cm. The pancreatic parenchyma enhances without evidence of necrosis. A small amount of fluid is partially visualized in the distal esophagus and may reflect reflux. Slight prominence of the proximal jejunum may reflect very mild focal ileus due to the adjacent pancreatic inflammation. Oral contrast is present in multiple loops of nondilated small bowel more distally without evidence of mechanical obstruction. The colon is largely decompressed. Small volume intraperitoneal free fluid is present in the abdomen and pelvis. Left-sided retroperitoneal hematoma has greatly decreased in size, now measuring 6.9 x 4.3 x 10.6 cm (previously 11.1 x 9.2 x 20.1 cm). The hematoma is now more uniformly low in density reflecting old blood products. Adjacent embolization  coils are again noted. 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 The SMV and portal vein are patent. The uterus and ovaries are unremarkable, with a benign-appearing 3.2 cm cyst on the right. The bladder is unremarkable. No enlarged lymph nodes are identified. No acute osseous abnormality is seen. IMPRESSION: 1. Acute pancreatitis with moderate peripancreatic inflammation. 3 cm complex fluid collection or focal phlegmonous change adjacent to the pancreatic tail, smaller than the simpler appearing collection on the prior CT. 2. 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 3. Hepatic steatosis. Electronically Signed   By: Sebastian Ache M.D.   On: 09/09/2015 10:15   I have personally reviewed and evaluated these images and lab results as part of my medical decision-making.   EKG Interpretation None      MDM   Final diagnoses:  Acute pancreatitis, unspecified pancreatitis type  Prolonged Q-T interval on ECG    Afebrile patient  with hx alcoholic pancreatitis p/w epigastric pain, N/V c/w pancreatitis.  Did drink beer recently.  Workup confirms this with elevated lipase and CT changes, some e/o mild dehydration as well.  Pt has prolonged QT and therefore requires monitoring, concern for giving antiemetics that it might worsen.  Pt did well in ED with IVF and symptomatic medications.  Admitted to [facility], Dr Gwenlyn Perking accepting.      Trixie Dredge, PA-C 09/09/15 1630  Tomasita Crumble, MD 09/10/15 [phone]

## 2015-09-09 NOTE — ED Notes (Signed)
Pt brought in by EMS with c/o abd pain, nausea, vomiting  Onset of sxs was yesterday  Pt has hx of pancreatitis  Pt was ambulatory to truck  Pt rating pain 9/10

## 2015-09-09 NOTE — ED Notes (Signed)
Opened first vial of dilaudid and found vial was cracked and leaking. Wasted original vial and got new intact vial to give pt.

## 2015-09-10 DIAGNOSIS — K859 Acute pancreatitis without necrosis or infection, unspecified: Secondary | ICD-10-CM

## 2015-09-10 DIAGNOSIS — E669 Obesity, unspecified: Secondary | ICD-10-CM | POA: Diagnosis present

## 2015-09-10 DIAGNOSIS — Z6832 Body mass index (BMI) 32.0-32.9, adult: Secondary | ICD-10-CM | POA: Diagnosis not present

## 2015-09-10 DIAGNOSIS — K860 Alcohol-induced chronic pancreatitis: Secondary | ICD-10-CM | POA: Diagnosis not present

## 2015-09-10 DIAGNOSIS — J45909 Unspecified asthma, uncomplicated: Secondary | ICD-10-CM | POA: Diagnosis present

## 2015-09-10 DIAGNOSIS — F17210 Nicotine dependence, cigarettes, uncomplicated: Secondary | ICD-10-CM | POA: Diagnosis present

## 2015-09-10 DIAGNOSIS — K852 Alcohol induced acute pancreatitis without necrosis or infection: Secondary | ICD-10-CM | POA: Diagnosis present

## 2015-09-10 DIAGNOSIS — I10 Essential (primary) hypertension: Secondary | ICD-10-CM | POA: Diagnosis present

## 2015-09-10 DIAGNOSIS — K219 Gastro-esophageal reflux disease without esophagitis: Secondary | ICD-10-CM | POA: Diagnosis present

## 2015-09-10 DIAGNOSIS — R109 Unspecified abdominal pain: Secondary | ICD-10-CM | POA: Diagnosis present

## 2015-09-10 DIAGNOSIS — I4581 Long QT syndrome: Secondary | ICD-10-CM | POA: Diagnosis present

## 2015-09-10 DIAGNOSIS — Z79899 Other long term (current) drug therapy: Secondary | ICD-10-CM | POA: Diagnosis not present

## 2015-09-10 DIAGNOSIS — F1010 Alcohol abuse, uncomplicated: Secondary | ICD-10-CM | POA: Diagnosis present

## 2015-09-10 DIAGNOSIS — K858 Other acute pancreatitis without necrosis or infection: Secondary | ICD-10-CM | POA: Diagnosis not present

## 2015-09-10 LAB — COMPREHENSIVE METABOLIC PANEL
ALBUMIN: 2.9 g/dL — AB (ref 3.5–5.0)
ALK PHOS: 83 U/L (ref 38–126)
ALT: 23 U/L (ref 14–54)
ANION GAP: 8 (ref 5–15)
AST: 19 U/L (ref 15–41)
BILIRUBIN TOTAL: 1.1 mg/dL (ref 0.3–1.2)
BUN: 6 mg/dL (ref 6–20)
CALCIUM: 8.4 mg/dL — AB (ref 8.9–10.3)
CO2: 25 mmol/L (ref 22–32)
Chloride: 102 mmol/L (ref 101–111)
Creatinine, Ser: 0.6 mg/dL (ref 0.44–1.00)
GFR calc Af Amer: >60 mL/min (ref >60)
GFR calc non Af Amer: >60 mL/min (ref >60)
GLUCOSE: 91 mg/dL (ref 65–99)
Potassium: 3.7 mmol/L (ref 3.5–5.1)
Sodium: 135 mmol/L (ref 135–145)
TOTAL PROTEIN: 5.6 g/dL — AB (ref 6.5–8.1)

## 2015-09-10 LAB — CBC
HCT: 41.5 % (ref 36.0–46.0)
Hemoglobin: 14.2 g/dL (ref 12.0–15.0)
MCH: 32.9 pg (ref 26.0–34.0)
MCHC: 34.2 g/dL (ref 30.0–36.0)
MCV: 96.1 fL (ref 78.0–100.0)
PLATELETS: 140 10*3/uL — AB (ref 150–400)
RBC: 4.32 MIL/uL (ref 3.87–5.11)
RDW: 13.9 % (ref 11.5–15.5)
WBC: 9.7 10*3/uL (ref 4.0–10.5)

## 2015-09-10 LAB — LIPASE, BLOOD: LIPASE: 190 U/L — AB (ref 11–51)

## 2015-09-10 NOTE — Progress Notes (Signed)
[facility] PROGRESS NOTE    Progress Note  Kara [name]  JXB:[id number] DOB: 10-04-1985 DOA: 09/09/2015 PCP:  Duane Lope, MD     Brief Narrative:   Kara [name] is an 30 y.o. female   Assessment/Plan:   Principal Problem:   Pancreatitis Active Problems:   Prolonged Q-T interval on ECG   Alcohol abuse   Obesity (BMI 30-39.9)   GERD (gastroesophageal reflux disease)   Benign essential HTN  She continues to have abdominal pain requiring high doses of IV narcotics, will continue IV fluids and IV pain medication. Fasting lipid panel showed triglycerides of 87 is probably alcohol induced. She will continue to be nothing by mouth. Her QTC is stable compared to prior tracing. Discontinue telemetry. Continue thiamine and folate monitor with CIWA Continue her home antihypertensive medication. DVT prophylaxis: SCD's Family Communication:none Disposition Plan/Barrier to D/C: home 1 -2 days Code Status:     Code Status Orders        Start     Ordered   09/09/15 1653  Full code   Continuous     09/09/15 1654    Code Status History    Date Active Date Inactive Code Status Order ID Comments User Context   02/10/2015  1:02 AM 02/21/2015  7:39 PM Full Code [id number]  Ron Parker, MD Inpatient   01/29/2015  3:59 PM 02/09/2015  2:25 PM Full Code [id number]  Marinda Elk, MD ED   07/16/2014  4:11 AM 07/19/2014  3:27 PM Full Code [id number]  Eduard Clos, MD Inpatient   07/06/2014  3:39 AM 07/06/2014  8:42 PM Full Code [id number]  Rolly Salter, MD Inpatient   04/08/2014  5:18 PM 04/09/2014  7:52 PM Full Code [id number]  Maryruth Bun Rama, MD ED        IV Access:    Peripheral IV   Procedures and diagnostic studies:   Ct Abdomen Pelvis W Contrast  09/09/2015  CLINICAL DATA:  Pancreatitis. Abdominal pain, nausea, and vomiting beginning yesterday. EXAM: CT ABDOMEN AND PELVIS WITH CONTRAST TECHNIQUE: Multidetector CT imaging of the abdomen and pelvis was  performed using the standard protocol following bolus administration of intravenous contrast. CONTRAST:  100 mL Isovue-300 COMPARISON:  02/21/2015 FINDINGS: Minimal dependent atelectasis is noted in the right lower lobe. There is no pleural effusion. Diffusely decreased attenuation of the liver is consistent with steatosis. The liver is enlarged, measuring 20 cm in craniocaudal length. There is no biliary dilatation. The gallbladder, spleen, adrenal glands, and kidneys are unremarkable. There is new, moderate diffuse peripancreatic stranding and small volume fluid. The fluid collection along the superior aspect of the pancreatic tail on the prior study has decreased in size, with a residual more complex collection or focal phlegmonous change in this location measuring 2.9 x 2.5 x 2.2 cm. The pancreatic parenchyma enhances without evidence of necrosis. A small amount of fluid is partially visualized in the distal esophagus and may reflect reflux. Slight prominence of the proximal jejunum may reflect very mild focal ileus due to the adjacent pancreatic inflammation. Oral contrast is present in multiple loops of nondilated small bowel more distally without evidence of mechanical obstruction. The colon is largely decompressed. Small volume intraperitoneal free fluid is present in the abdomen and pelvis. Left-sided retroperitoneal hematoma has greatly decreased in size, now measuring 6.9 x 4.3 x 10.6 cm (previously 11.1 x 9.2 x 20.1 cm). The hematoma is now more uniformly low in density reflecting old blood products. Adjacent embolization coils are  again noted. 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 The SMV and portal vein are patent. The uterus and ovaries are unremarkable, with a benign-appearing 3.2 cm cyst on the right. The bladder is unremarkable. No enlarged lymph nodes are identified. No acute osseous abnormality is seen. IMPRESSION: 1. Acute pancreatitis with moderate peripancreatic inflammation. 3 cm complex  fluid collection or focal phlegmonous change adjacent to the pancreatic tail, smaller than the simpler appearing collection on the prior CT. 2. 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 3. Hepatic steatosis. Electronically Signed   By: Sebastian AcheAllen  Grady M.D.   On: 09/09/2015 10:15     Medical Consultants:    None.  Anti-Infectives:   none  Subjective:    Kara LewAmanda [name]   Objective:    Filed Vitals:   09/09/15 1232 09/09/15 2210 09/10/15 0649 09/10/15 1300  BP: 135/100 125/91 126/85 122/81  Pulse: 93 94 80 100  Temp: 98 F (36.7 C) 98.2 F (36.8 C) 98.2 F (36.8 C) 98.4 F (36.9 C)  TempSrc: Oral Oral Oral Oral  Resp: 18 19 20 18   Height: 5\' 5"  (1.651 m)     Weight: 67.994 kg (149 lb 14.4 oz)  88.905 kg (196 lb)   SpO2: 99% 100% 98% 99%    Intake/Output Summary (Last 24 hours) at 09/10/15 1325 Last data filed at 09/10/15 0900  Gross per 24 hour  Intake   1165 ml  Output    500 ml  Net    665 ml   Filed Weights   09/09/15 1232 09/10/15 0649  Weight: 67.994 kg (149 lb 14.4 oz) 88.905 kg (196 lb)    Exam: General exam: In no acute distress. Respiratory system: Good air movement and clear to auscultation. Cardiovascular system: S1 & S2 heard, RRR. No JVD, murmurs, rubs, gallops or clicks.  Gastrointestinal system: Abdomen is nondistended, soft and nontender.  Central nervous system: Alert and oriented. No focal neurological deficits. Extremities: No pedal edema. Skin: No rashes, lesions or ulcers Psychiatry: Judgement and insight appear normal. Mood & affect appropriate.    Data Reviewed:    Labs: Basic Metabolic Panel:  Recent Labs Lab 09/09/15 0522 09/09/15 1724 09/10/15 0514  NA 132*  --  135  K 3.9  --  3.7  CL 102  --  102  CO2 20*  --  25  GLUCOSE 137*  --  91  BUN 7  --  6  CREATININE 0.61 0.70 0.60  CALCIUM 9.3  --  8.4*  MG  --  1.6*  --   PHOS  --  2.7  --    GFR Estimated Creatinine Clearance: 112.3 mL/min (by C-G  formula based on Cr of 0.6). Liver Function Tests:  Recent Labs Lab 09/09/15 0522 09/10/15 0514  AST 35 19  ALT 41 23  ALKPHOS 112 83  BILITOT 1.3* 1.1  PROT 7.4 5.6*  ALBUMIN 3.8 2.9*    Recent Labs Lab 09/09/15 0522 09/10/15 0514  LIPASE 619* 190*   No results for input(s): AMMONIA in the last 168 hours. Coagulation profile No results for input(s): INR, PROTIME in the last 168 hours.  CBC:  Recent Labs Lab 09/09/15 0522 09/09/15 1724 09/10/15 0514  WBC 15.0* 13.2* 9.7  HGB 17.8* 16.2* 14.2  HCT 50.2* 45.0 41.5  MCV 94.4 93.0 96.1  PLT 270 207 140*   Cardiac Enzymes: No results for input(s): CKTOTAL, CKMB, CKMBINDEX, TROPONINI in the last 168 hours. BNP (last 3 results) No results for input(s): PROBNP  in the last 8760 hours. CBG: No results for input(s): GLUCAP in the last 168 hours. D-Dimer: No results for input(s): DDIMER in the last 72 hours. Hgb A1c: No results for input(s): HGBA1C in the last 72 hours. Lipid Profile:  Recent Labs  09/09/15 1724  CHOL 174  HDL 57  LDLCALC 100*  TRIG 87  CHOLHDL 3.1   Thyroid function studies: No results for input(s): TSH, T4TOTAL, T3FREE, THYROIDAB in the last 72 hours.  Invalid input(s): FREET3 Anemia work up: No results for input(s): VITAMINB12, FOLATE, FERRITIN, TIBC, IRON, RETICCTPCT in the last 72 hours. Sepsis Labs:  Recent Labs Lab 09/09/15 0522 09/09/15 1724 09/10/15 0514  WBC 15.0* 13.2* 9.7   Microbiology No results found for this or any previous visit (from the past 240 hour(s)).   Medications:   . budesonide (PULMICORT) nebulizer solution  0.25 mg Nebulization BID  . famotidine  20 mg Oral QHS  . folic acid  1 mg Oral Daily  . gabapentin  300 mg Oral TID  . heparin  5,000 Units Subcutaneous Q8H  . loratadine  10 mg Oral Daily  . metoprolol succinate  25 mg Oral Daily  . montelukast  10 mg Oral Daily  . multivitamin with minerals  1 tablet Oral Daily  . sodium chloride flush  3  mL Intravenous Q12H  . thiamine  100 mg Oral Daily   Or  . thiamine  100 mg Intravenous Daily   Continuous Infusions: . sodium chloride 100 mL/hr at 09/10/15 0908    Time spent: 15 min    FELIZ Rosine Beat  [facility] Pager [phone]  *Please refer to [email or web address], password [email or web address] to get updated schedule on who will round on this patient, as hospitalists switch teams weekly. If 7PM-7AM, please contact night-coverage at [email or web address], password [email or web address] for any overnight needs.  09/10/2015, 1:25 PM

## 2015-09-11 DIAGNOSIS — K852 Alcohol induced acute pancreatitis without necrosis or infection: Principal | ICD-10-CM

## 2015-09-11 NOTE — Discharge Summary (Signed)
Physician Discharge Summary  Kara [name] ZOX:[id number] DOB: [birth year] DOA: 09/09/2015  PCP:  Duane Lope, MD  Admit date: 09/09/2015 Discharge date: 09/11/2015  Time spent: 35 minutes  Recommendations for Outpatient Follow-up:  1. Follow up with PCP as an outpatient   Discharge Diagnoses:  Principal Problem:   Pancreatitis Active Problems:   Prolonged Q-T interval on ECG   Alcohol abuse   Obesity (BMI 30-39.9)   Acute pancreatitis   GERD (gastroesophageal reflux disease)   Benign essential HTN   Discharge Condition: stable  Diet recommendation: regular  Filed Weights   09/09/15 1232 09/10/15 0649  Weight: 67.994 kg (149 lb 14.4 oz) 88.905 kg (196 lb)    History of present illness:  30 y.o. female with PMH significant for alcohol abuse, asthma, GERD, hypertension and prior history of pancreatitis; who came to the emergency department secondary to abdominal pain with associated nausea and vomiting. Patient reports drinking some beers over the weekend with husband and friends; and subsequent has developed many abdominal discomfort for with associated nausea and vomiting.  Hospital Course:  Alcoholic Pancreatitis Prolonged Q-T interval on ECG Alcohol abuse Obesity (BMI 30-39.9) GERD (gastroesophageal reflux disease) Benign essential HTN  Placed NPO and IV narcotics with IV fluid after 2 days her pain improved and tolerate it her diet Fasting lipid panel showed triglycerides of 87. Her QTC is stable compared to prior tracing.  No signs of withdrawals. Continue her home antihypertensive medication.  Procedures:  CT abd and pelvis as below  Consultations:  none  Discharge Exam: Filed Vitals:   09/10/15 2100 09/11/15 0612  BP: 119/89 117/86  Pulse: 84 79  Temp: 98.5 F (36.9 C) 98 F (36.7 C)  Resp: 18 18    General: A&O x3 Cardiovascular: RRR Respiratory: good air movement CTA B/L  Discharge Instructions   Discharge Instructions    Diet - low  sodium heart healthy    Complete by:  As directed      Increase activity slowly    Complete by:  As directed           Current Discharge Medication List    CONTINUE these medications which have NOT CHANGED   Details  albuterol (PROVENTIL HFA;VENTOLIN HFA) 108 (90 Base) MCG/ACT inhaler Inhale 2 puffs into the lungs every 6 (six) hours as needed for wheezing or shortness of breath (wheezing and sob). Qty: 1 Inhaler, Refills: 1    beclomethasone (QVAR) 40 MCG/ACT inhaler Inhale 2 puffs into the lungs 2 (two) times daily.    fexofenadine (ALLEGRA) 180 MG tablet Take 180 mg by mouth daily as needed for allergies (allergies). Reported on 05/13/2015    gabapentin (NEURONTIN) 300 MG capsule Take 300 mg by mouth 3 (three) times daily.    metoprolol succinate (TOPROL-XL) 25 MG 24 hr tablet Take 1 tablet (25 mg total) by mouth daily. Qty: 90 tablet, Refills: 3    mometasone-formoterol (DULERA) 200-5 MCG/ACT AERO Inhale 2 puffs into the lungs 2 (two) times daily. Qty: 1 Inhaler, Refills: 3    montelukast (SINGULAIR) 10 MG tablet Take 1 tablet (10 mg total) by mouth daily. Qty: 30 tablet, Refills: 3    Multiple Vitamin (MULTIVITAMIN WITH MINERALS) TABS tablet Take 1 tablet by mouth daily. Qty: 30 tablet, Refills: 0    omeprazole (PRILOSEC) 40 MG capsule Take 1 capsule (40 mg total) by mouth daily. Qty: 30 capsule, Refills: 5    EPINEPHrine 0.15 MG/0.15ML IJ injection Inject 0.15 mLs (0.15 mg total) into the muscle  as needed for anaphylaxis. Qty: 2 Device, Refills: 1       Allergies  Allergen Reactions  . Peanuts [Peanut Oil] Itching    Sensitivity       The results of significant diagnostics from this hospitalization (including imaging, microbiology, ancillary and laboratory) are listed below for reference.    Significant Diagnostic Studies: Ct Abdomen Pelvis W Contrast  09/09/2015  CLINICAL DATA:  Pancreatitis. Abdominal pain, nausea, and vomiting beginning yesterday. EXAM: CT  ABDOMEN AND PELVIS WITH CONTRAST TECHNIQUE: Multidetector CT imaging of the abdomen and pelvis was performed using the standard protocol following bolus administration of intravenous contrast. CONTRAST:  100 mL Isovue-300 COMPARISON:  02/21/2015 FINDINGS: Minimal dependent atelectasis is noted in the right lower lobe. There is no pleural effusion. Diffusely decreased attenuation of the liver is consistent with steatosis. The liver is enlarged, measuring 20 cm in craniocaudal length. There is no biliary dilatation. The gallbladder, spleen, adrenal glands, and kidneys are unremarkable. There is new, moderate diffuse peripancreatic stranding and small volume fluid. The fluid collection along the superior aspect of the pancreatic tail on the prior study has decreased in size, with a residual more complex collection or focal phlegmonous change in this location measuring 2.9 x 2.5 x 2.2 cm. The pancreatic parenchyma enhances without evidence of necrosis. A small amount of fluid is partially visualized in the distal esophagus and may reflect reflux. Slight prominence of the proximal jejunum may reflect very mild focal ileus due to the adjacent pancreatic inflammation. Oral contrast is present in multiple loops of nondilated small bowel more distally without evidence of mechanical obstruction. The colon is largely decompressed. Small volume intraperitoneal free fluid is present in the abdomen and pelvis. Left-sided retroperitoneal hematoma has greatly decreased in size, now measuring 6.9 x 4.3 x 10.6 cm (previously 11.1 x 9.2 x 20.1 cm). The hematoma is now more uniformly low in density reflecting old blood products. Adjacent embolization coils are again noted. 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 The SMV and portal vein are patent. The uterus and ovaries are unremarkable, with a benign-appearing 3.2 cm cyst on the right. The bladder is unremarkable. No enlarged lymph nodes are identified. No acute osseous abnormality  is seen. IMPRESSION: 1. Acute pancreatitis with moderate peripancreatic inflammation. 3 cm complex fluid collection or focal phlegmonous change adjacent to the pancreatic tail, smaller than the simpler appearing collection on the prior CT. 2. 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 3. Hepatic steatosis. Electronically Signed   By: Sebastian Ache M.D.   On: 09/09/2015 10:15    Microbiology: No results found for this or any previous visit (from the past 240 hour(s)).   Labs: Basic Metabolic Panel:  Recent Labs Lab 09/09/15 0522 09/09/15 1724 09/10/15 0514  NA 132*  --  135  K 3.9  --  3.7  CL 102  --  102  CO2 20*  --  25  GLUCOSE 137*  --  91  BUN 7  --  6  CREATININE 0.61 0.70 0.60  CALCIUM 9.3  --  8.4*  MG  --  1.6*  --   PHOS  --  2.7  --    Liver Function Tests:  Recent Labs Lab 09/09/15 0522 09/10/15 0514  AST 35 19  ALT 41 23  ALKPHOS 112 83  BILITOT 1.3* 1.1  PROT 7.4 5.6*  ALBUMIN 3.8 2.9*    Recent Labs Lab 09/09/15 0522 09/10/15 0514  LIPASE 619* 190*   No results for input(s):  AMMONIA in the last 168 hours. CBC:  Recent Labs Lab 09/09/15 0522 09/09/15 1724 09/10/15 0514  WBC 15.0* 13.2* 9.7  HGB 17.8* 16.2* 14.2  HCT 50.2* 45.0 41.5  MCV 94.4 93.0 96.1  PLT 270 207 140*   Cardiac Enzymes: No results for input(s): CKTOTAL, CKMB, CKMBINDEX, TROPONINI in the last 168 hours. BNP: BNP (last 3 results) No results for input(s): BNP in the last 8760 hours.  ProBNP (last 3 results) No results for input(s): PROBNP in the last 8760 hours.  CBG: No results for input(s): GLUCAP in the last 168 hours.    Signed:  Marinda ElkFELIZ ORTIZ, Maika Kaczmarek MD.  [facility] 09/11/2015, 12:55 PM

## 2015-10-16 ENCOUNTER — Other Ambulatory Visit: Payer: Self-pay | Admitting: Neurology

## 2015-10-16 ENCOUNTER — Telehealth: Payer: Self-pay | Admitting: Neurology

## 2015-10-16 MED ORDER — GABAPENTIN 300 MG PO CAPS: ORAL_CAPSULE | ORAL | Status: DC

## 2015-10-16 NOTE — Telephone Encounter (Signed)
Last seen 10-29-15.  I have spoken with Kara [name] this afternoon and advised she must be seen if she needs RAS to continue to give Gabapentin rx.  It would also be ok for pcp to take over this rx.  She verbalized understanding of same, sts. pcp will not take over Gabapentin rx.  Appt. given for 11-17-15 at 1340.  [facility] [address].  No further r/f until seen./fim

## 2015-10-16 NOTE — Telephone Encounter (Signed)
Patient requesting refill of  gabapentin (NEURONTIN) 300 MG capsule. She does not have enough medication for tonight. Please call to [facility] [address] [facility] [address]. She says if she needs to make an appointment she will but says was told only to come back if needed.

## 2015-11-03 ENCOUNTER — Other Ambulatory Visit: Payer: Self-pay | Admitting: Allergy and Immunology

## 2015-11-04 ENCOUNTER — Other Ambulatory Visit: Payer: Self-pay | Admitting: Allergy and Immunology

## 2015-11-11 ENCOUNTER — Other Ambulatory Visit: Payer: Self-pay | Admitting: Neurology

## 2015-11-17 ENCOUNTER — Encounter: Payer: Self-pay | Admitting: Neurology

## 2015-11-17 ENCOUNTER — Ambulatory Visit (INDEPENDENT_AMBULATORY_CARE_PROVIDER_SITE_OTHER): Payer: BC Managed Care – PPO | Admitting: Neurology

## 2015-11-17 VITALS — BP 137/92 | HR 91 | Ht 65.0 in | Wt 202.0 lb

## 2015-11-17 DIAGNOSIS — F1010 Alcohol abuse, uncomplicated: Secondary | ICD-10-CM

## 2015-11-17 DIAGNOSIS — R208 Other disturbances of skin sensation: Secondary | ICD-10-CM

## 2015-11-17 DIAGNOSIS — G629 Polyneuropathy, unspecified: Secondary | ICD-10-CM

## 2015-11-17 DIAGNOSIS — G4700 Insomnia, unspecified: Secondary | ICD-10-CM

## 2015-11-17 DIAGNOSIS — R200 Anesthesia of skin: Secondary | ICD-10-CM

## 2015-11-17 DIAGNOSIS — F1011 Alcohol abuse, in remission: Secondary | ICD-10-CM

## 2015-11-17 MED ORDER — GABAPENTIN 300 MG PO CAPS: ORAL_CAPSULE | ORAL | 11 refills | Status: DC

## 2015-11-17 NOTE — Progress Notes (Signed)
[facility]  PATIENT: Kara [name] DOB: 1986/01/18  REFERRING DOCTOR OR PCP:  Gildardo Cranker SOURCE: Patient and EMR / ED records.     _________________________________   HISTORICAL  CHIEF COMPLAINT:  Chief Complaint  Patient presents with  . Polyneuropathy    She tried backing off her gabapentin dose to , qhs but had significant worsening of pain, numbness and tingling.  These sympotms were especially present in her bilateral hands and feet.  She is now taking her gabapentin  on the following schedule:  2 caps in am, 1 cap in afternoon, 3 caps at bedtime.    HISTORY OF PRESENT ILLNESS:  Kara [name] is a 30 year old woman with polyneuropathy and dysesthesias.    On gabapentin 600-300-900 she feels pain is much better and she tolerates it well.    She has more pain if she misses several doses.    She tries to exercise more regularly and is eating better.  Polyneuropathy history:   She has a history of alcohol abuse and began to notice difficulty with her balance and walking in March, 2016 after a severe gastroenteritis hospitalization.   She lost 40 pounds with that hospitalization.       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 There is right greater than left foraminal narrowing. There is no spinal cord abnormality noted.   MRI of the  brain was normal.     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    Insomnia:   She had insomnia which is doing much better with gabapentin.   Back and neck pain is doing better also  Alcohol history:     She is a recovering alcoholic and used to drink several shots of whiskey nightly.  She does not drink much anymore.    REVIEW OF SYSTEMS: Constitutional: No fevers, chills, sweats, or change in appetite.   Some insomnia Eyes: No visual changes, double vision, eye pain Ear, nose and throat: No hearing loss, ear pain, nasal congestion, sore throat Cardiovascular:  No chest pain, palpitations Respiratory: No shortness of breath at rest or with exertion.   No wheezes GastrointestinaI: as above Genitourinary: No dysuria, urinary retention or frequency.  No nocturia. Musculoskeletal: No neck pain, back pain Integumentary: No rash, pruritus, skin lesions Neurological: as above Psychiatric: No depression at this time.  Some anxiety Endocrine: No palpitations, diaphoresis, change in appetite, change in weigh or increased thirst Hematologic/Lymphatic: No anemia, purpura, petechiae. Allergic/Immunologic: No itchy/runny eyes, nasal congestion, recent allergic reactions, rashes  ALLERGIES: Allergies  Allergen Reactions  . Peanuts [Peanut Oil] Itching    Sensitivity     HOME MEDICATIONS:  Current Outpatient Prescriptions:  .  albuterol (PROVENTIL HFA;VENTOLIN HFA) 108 (90 Base) MCG/ACT inhaler, Inhale 2 puffs into the lungs every 6 (six) hours as needed for wheezing or shortness of breath (wheezing and sob)., Disp: 1 Inhaler, Rfl: 1 .  beclomethasone (QVAR) 40 MCG/ACT inhaler, Inhale 2 puffs into the lungs 2 (two) times daily., Disp: , Rfl:  .  DULERA 200-5 MCG/ACT AERO, INHALE 2 PUFFS INTO THE LUNGS TWICE DAILY, Disp: 13 g, Rfl: 5 .  EPINEPHrine 0.15 MG/0.15ML IJ injection, Inject 0.15 mLs (0.15 mg total) into the muscle as needed for anaphylaxis., Disp: 2 Device, Rfl: 1 .  fexofenadine (ALLEGRA) 180 MG tablet, Take 180 mg by mouth daily as needed for allergies (allergies). Reported on 05/13/2015, Disp: , Rfl:  .  gabapentin (NEURONTIN) 300 MG capsule,  Take 6 pills a day as directed, Disp: 180 capsule, Rfl: 11 .  metoprolol succinate (TOPROL-XL) 25 MG 24 hr tablet, Take 1 tablet (25 mg total) by mouth daily., Disp: 90 tablet, Rfl: 3 .  montelukast (SINGULAIR) 10 MG tablet, TAKE 1 TABLET(10 MG) BY MOUTH DAILY, Disp: 30 tablet, Rfl: 0 .  Multiple Vitamin (MULTIVITAMIN WITH MINERALS) TABS tablet, Take 1 tablet by mouth daily., Disp: 30 tablet, Rfl: 0 .   omeprazole (PRILOSEC) 40 MG capsule, Take 1 capsule (40 mg total) by mouth daily., Disp: 30 capsule, Rfl: 5  PAST MEDICAL HISTORY: Past Medical History:  Diagnosis Date  . Alcohol abuse   . Asthma   . Environmental allergies   . GERD (gastroesophageal reflux disease)   . Headache   . Hypertension   . Pancreatitis   . Prolonged QT syndrome   . Syncope and collapse   . Vision abnormalities     PAST SURGICAL HISTORY: Past Surgical History:  Procedure Laterality Date  . ESOPHAGOGASTRODUODENOSCOPY N/A 07/17/2014   Procedure: ESOPHAGOGASTRODUODENOSCOPY (EGD);  Surgeon: Dorena Cookey, MD;  Location: [facility];  Service: Endoscopy;  Laterality: N/A;  . TYMPANOSTOMY TUBE PLACEMENT      FAMILY HISTORY: Family History  Problem Relation Age of Onset  . Heart failure Mother   . Heart failure Father   . Heart failure Maternal Grandfather     SOCIAL HISTORY:  Social History   Social History  . Marital status: Married    Spouse name: [name]  . Number of children: 0  . Years of education: N/A   Occupational History  . College advisor    Social History Main Topics  . Smoking status: Current Every Day Smoker    Packs/day: 0.50    Years: 2.00    Types: Cigarettes    Last attempt to quit: 04/08/2012  . Smokeless tobacco: Never Used  . Alcohol use 1.8 oz/week    3 Shots of liquor per week  . Drug use: No  . Sexual activity: Yes    Birth control/ protection: None   Other Topics Concern  . Not on file   Social History Narrative   Married.     PHYSICAL EXAM  Vitals:   11/17/15 1358  BP: (!) 137/92  Pulse: 91  Weight: 202 lb (91.6 kg)  Height: 5\' 5"  (1.651 m)    Body mass index is 33.61 kg/m.   General: The patient is well-developed and well-nourished and in no acute distress  Neck:  The neck is nontender.  Musculoskeletal:  Back is nontender.   Neurologic Exam  Mental status: The patient is alert and oriented x 3 at the time of the examination. The  patient has apparent normal recent and remote memory, with an apparently normal attention span and concentration ability.   Speech is normal.  Cranial nerves: Extraocular movements are full.  .Facial strength is normal.  Trapezius and sternocleidomastoid strength is normal. No dysarthria is noted.    Motor:  Muscle bulk is normal.   Tone is normal. Strength is  5 / 5 in all 4 extremities.   Sensory: Sensory testing is intact to pinprick, soft touch and vibration sensation in all 4 limbs now, including toes  Gait and station: Station is normal.   Gait is normal. Tandem gait is now normal.   Reflexes: Deep tendon reflexes are symmetric and normal bilaterally.      DIAGNOSTIC DATA (LABS, IMAGING, TESTING) - I reviewed patient records, labs, notes, testing and imaging myself  where available.  Lab Results  Component Value Date   WBC 9.7 09/10/2015   HGB 14.2 09/10/2015   HCT 41.5 09/10/2015   MCV 96.1 09/10/2015   PLT 140 (L) 09/10/2015      Component Value Date/Time   NA 135 09/10/2015 0514   NA 142 09/02/2014 1507   K 3.7 09/10/2015 0514   CL 102 09/10/2015 0514   CO2 25 09/10/2015 0514   GLUCOSE 91 09/10/2015 0514   BUN 6 09/10/2015 0514   BUN 7 09/02/2014 1507   CREATININE 0.60 09/10/2015 0514   CALCIUM 8.4 (L) 09/10/2015 0514   PROT 5.6 (L) 09/10/2015 0514   PROT 7.6 09/02/2014 1507   ALBUMIN 2.9 (L) 09/10/2015 0514   ALBUMIN 4.1 09/02/2014 1507   AST 19 09/10/2015 0514   ALT 23 09/10/2015 0514   ALKPHOS 83 09/10/2015 0514   BILITOT 1.1 09/10/2015 0514   BILITOT 0.7 09/02/2014 1507   GFRNONAA >60 09/10/2015 0514   GFRAA >60 09/10/2015 0514    Lab Results  Component Value Date   VITAMINB12 632 09/02/2014   Lab Results  Component Value Date   TSH 1.303 07/16/2014       ASSESSMENT AND PLAN  Polyneuropathy (HCC)  Insomnia  Numbness of foot  History of alcohol abuse    1.   Continue gabapentin 600-300-900, refill.    2,   Try to exercise and stay  active 3.  She will return in one year or as needed.  If her primary care doctor is able to write the gabapentin, f/u in  this office is optional.   She should call for any new or worsening neurologic symptoms.   Jabin Tapp A. Epimenio Foot, MD, PhD 11/17/2015, 2:23 PM Certified in Neurology, Clinical Neurophysiology, Sleep Medicine, Pain Medicine and Neuroimaging  [facility] [address], [address] [place], [place] [address] [phone]  f f

## 2015-12-06 ENCOUNTER — Emergency Department (HOSPITAL_COMMUNITY)
Admission: EM | Admit: 2015-12-06 | Discharge: 2015-12-07 | Disposition: A | Discharge status: Home / Self Care | Payer: BC Managed Care – PPO | Attending: Emergency Medicine | Admitting: Emergency Medicine

## 2015-12-06 ENCOUNTER — Encounter (HOSPITAL_COMMUNITY): Payer: Self-pay

## 2015-12-06 DIAGNOSIS — J45909 Unspecified asthma, uncomplicated: Secondary | ICD-10-CM | POA: Insufficient documentation

## 2015-12-06 DIAGNOSIS — F17210 Nicotine dependence, cigarettes, uncomplicated: Secondary | ICD-10-CM | POA: Insufficient documentation

## 2015-12-06 DIAGNOSIS — Z91010 Allergy to peanuts: Secondary | ICD-10-CM | POA: Diagnosis not present

## 2015-12-06 DIAGNOSIS — I10 Essential (primary) hypertension: Secondary | ICD-10-CM | POA: Diagnosis not present

## 2015-12-06 DIAGNOSIS — R45851 Suicidal ideations: Secondary | ICD-10-CM | POA: Diagnosis present

## 2015-12-06 DIAGNOSIS — Z79899 Other long term (current) drug therapy: Secondary | ICD-10-CM | POA: Diagnosis not present

## 2015-12-06 DIAGNOSIS — F1010 Alcohol abuse, uncomplicated: Secondary | ICD-10-CM

## 2015-12-06 LAB — CBC
HCT: 45.2 % (ref 36.0–46.0)
HEMOGLOBIN: 15.8 g/dL — AB (ref 12.0–15.0)
MCH: 32.8 pg (ref 26.0–34.0)
MCHC: 35 g/dL (ref 30.0–36.0)
MCV: 94 fL (ref 78.0–100.0)
PLATELETS: 353 10*3/uL (ref 150–400)
RBC: 4.81 MIL/uL (ref 3.87–5.11)
RDW: 14.6 % (ref 11.5–15.5)
WBC: 13 10*3/uL — AB (ref 4.0–10.5)

## 2015-12-06 LAB — ETHANOL: ALCOHOL ETHYL (B): 389 mg/dL — AB (ref <5)

## 2015-12-06 LAB — RAPID URINE DRUG SCREEN, HOSP PERFORMED
AMPHETAMINES: NOT DETECTED
BENZODIAZEPINES: NOT DETECTED
Barbiturates: NOT DETECTED
COCAINE: NOT DETECTED
OPIATES: NOT DETECTED
TETRAHYDROCANNABINOL: NOT DETECTED

## 2015-12-06 LAB — COMPREHENSIVE METABOLIC PANEL
ALT: 51 U/L (ref 14–54)
AST: 134 U/L — AB (ref 15–41)
Albumin: 3.9 g/dL (ref 3.5–5.0)
Alkaline Phosphatase: 140 U/L — ABNORMAL HIGH (ref 38–126)
Anion gap: 17 — ABNORMAL HIGH (ref 5–15)
BUN: 8 mg/dL (ref 6–20)
CHLORIDE: 108 mmol/L (ref 101–111)
CO2: 20 mmol/L — AB (ref 22–32)
CREATININE: 0.63 mg/dL (ref 0.44–1.00)
Calcium: 8.3 mg/dL — ABNORMAL LOW (ref 8.9–10.3)
GFR calc non Af Amer: >60 mL/min (ref >60)
Glucose, Bld: 90 mg/dL (ref 65–99)
POTASSIUM: 3.5 mmol/L (ref 3.5–5.1)
SODIUM: 145 mmol/L (ref 135–145)
Total Bilirubin: 0.7 mg/dL (ref 0.3–1.2)
Total Protein: 7.6 g/dL (ref 6.5–8.1)

## 2015-12-06 LAB — PREGNANCY, URINE: Preg Test, Ur: NEGATIVE

## 2015-12-06 LAB — ACETAMINOPHEN LEVEL

## 2015-12-06 LAB — SALICYLATE LEVEL

## 2015-12-06 LAB — MAGNESIUM: Magnesium: 1.8 mg/dL (ref 1.7–2.4)

## 2015-12-06 LAB — LIPASE, BLOOD: Lipase: 33 U/L (ref 11–51)

## 2015-12-06 MED ORDER — LORAZEPAM 1 MG PO TABS: 0.0000 mg | ORAL_TABLET | Freq: Two times a day (BID) | ORAL | Status: DC

## 2015-12-06 MED ORDER — VITAMIN B-1 100 MG PO TABS
100.0000 mg | ORAL_TABLET | Freq: Once | ORAL | Status: DC
  Filled 2015-12-06: qty 1

## 2015-12-06 MED ORDER — SODIUM CHLORIDE 0.9 % IV BOLUS (SEPSIS)
1000.0000 mL | Freq: Once | INTRAVENOUS | Status: AC
  Administered 2015-12-06: 1000 mL via INTRAVENOUS

## 2015-12-06 MED ORDER — ONDANSETRON 4 MG PO TBDP
4.0000 mg | ORAL_TABLET | Freq: Once | ORAL | Status: AC
  Administered 2015-12-06: 4 mg via ORAL
  Filled 2015-12-06: qty 1

## 2015-12-06 MED ORDER — LORAZEPAM 1 MG PO TABS
0.0000 mg | ORAL_TABLET | Freq: Four times a day (QID) | ORAL | Status: DC
  Administered 2015-12-07: 1 mg via ORAL
  Filled 2015-12-06: qty 1
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
## 2015-12-06 NOTE — ED Triage Notes (Addendum)
Pt was picked up by EMS from a hotel. Pt was supposed to check out and refused so hotel staff called [employer] who in turn called EMS because pt was intoxicated and stated SI. Pt had an empty [place] bottle but denied other substances. Pt states she does have a plan for SI, but will not disclose it at this time

## 2015-12-06 NOTE — ED Notes (Signed)
With patient at this time, cooperative. Coke given, phone given to contact husband.

## 2015-12-06 NOTE — ED Notes (Addendum)
Lab critical, ETOH 389 RN and MD notified.

## 2015-12-06 NOTE — ED Notes (Addendum)
Patient stated "I am going to pass out" and proceeded to close eyes. Reactive to sternal rub. RN Baxter HireKristen witnessed patient open eye as this Clinical research associatewriter left the room. [facility]. Patient talking to pharmacist at this time. Becomes tearful at at times while speaking with pharmacist, but does not open eyes. Earrings removed, but unable to get rings off left ring finger.

## 2015-12-06 NOTE — ED Notes (Signed)
While NT Tresa EndoKelly was assessing pt, pt stated that she passed out. This RN came in to assess pt who was blinking eyes and responding to sternal rub. When NT left room, pt slowly opened her eyes and scanned room, when pt made eye contact with this RN, pt quickly closed her eyes again.

## 2015-12-07 LAB — ETHANOL: Alcohol, Ethyl (B): 138 mg/dL — ABNORMAL HIGH (ref <5)

## 2015-12-07 NOTE — ED Provider Notes (Signed)
Patient metabolized alcohol, able to ambulate without assistance, is now coherent and coordinated. Achieved clinical sobriety now denying any suicidal ideation or thoughts of hurting herself. States "I was out of it earlier". Plan to follow up with PCP as needed and return precautions discussed for worsening or new concerning symptoms.    Lyndal Pulleyaniel Brooksie Ellwanger, MD 12/07/15 [phone]

## 2015-12-07 NOTE — ED Notes (Signed)
Pt. Took all her personal belongings with her , a black purse with her cell phone ,wallet , keys and red bag with assorted clothes , witnessed by Butch PennyAllan M,RN

## 2015-12-08 ENCOUNTER — Other Ambulatory Visit: Payer: Self-pay | Admitting: Allergy and Immunology

## 2016-01-13 ENCOUNTER — Other Ambulatory Visit: Payer: Self-pay | Admitting: Allergy and Immunology

## 2016-01-28 ENCOUNTER — Emergency Department (HOSPITAL_COMMUNITY)
Admission: EM | Admit: 2016-01-28 | Discharge: 2016-01-28 | Disposition: A | Discharge status: Home / Self Care | Payer: BC Managed Care – PPO | Attending: Emergency Medicine | Admitting: Emergency Medicine

## 2016-01-28 ENCOUNTER — Emergency Department (HOSPITAL_COMMUNITY): Payer: BC Managed Care – PPO

## 2016-01-28 ENCOUNTER — Encounter (HOSPITAL_COMMUNITY): Payer: Self-pay

## 2016-01-28 DIAGNOSIS — I5032 Chronic diastolic (congestive) heart failure: Secondary | ICD-10-CM | POA: Insufficient documentation

## 2016-01-28 DIAGNOSIS — X501XXA Overexertion from prolonged static or awkward postures, initial encounter: Secondary | ICD-10-CM | POA: Insufficient documentation

## 2016-01-28 DIAGNOSIS — Y9368 Activity, volleyball (beach) (court): Secondary | ICD-10-CM | POA: Diagnosis not present

## 2016-01-28 DIAGNOSIS — J45909 Unspecified asthma, uncomplicated: Secondary | ICD-10-CM | POA: Insufficient documentation

## 2016-01-28 DIAGNOSIS — Y998 Other external cause status: Secondary | ICD-10-CM | POA: Diagnosis not present

## 2016-01-28 DIAGNOSIS — Y929 Unspecified place or not applicable: Secondary | ICD-10-CM | POA: Insufficient documentation

## 2016-01-28 DIAGNOSIS — S82121A Displaced fracture of lateral condyle of right tibia, initial encounter for closed fracture: Secondary | ICD-10-CM | POA: Insufficient documentation

## 2016-01-28 DIAGNOSIS — S8991XA Unspecified injury of right lower leg, initial encounter: Secondary | ICD-10-CM | POA: Diagnosis present

## 2016-01-28 DIAGNOSIS — Z79899 Other long term (current) drug therapy: Secondary | ICD-10-CM | POA: Insufficient documentation

## 2016-01-28 DIAGNOSIS — Z91010 Allergy to peanuts: Secondary | ICD-10-CM | POA: Diagnosis not present

## 2016-01-28 DIAGNOSIS — I110 Hypertensive heart disease with heart failure: Secondary | ICD-10-CM | POA: Insufficient documentation

## 2016-01-28 DIAGNOSIS — F17210 Nicotine dependence, cigarettes, uncomplicated: Secondary | ICD-10-CM | POA: Insufficient documentation

## 2016-01-28 DIAGNOSIS — S82141A Displaced bicondylar fracture of right tibia, initial encounter for closed fracture: Secondary | ICD-10-CM

## 2016-01-28 MED ORDER — OXYCODONE-ACETAMINOPHEN 5-325 MG PO TABS
1.0000 | ORAL_TABLET | Freq: Once | ORAL | Status: AC
  Administered 2016-01-28: 1 via ORAL
  Filled 2016-01-28: qty 1

## 2016-01-28 MED ORDER — HYDROMORPHONE HCL 1 MG/ML IJ SOLN
1.0000 mg | Freq: Once | INTRAMUSCULAR | Status: AC
  Administered 2016-01-28: 1 mg via INTRAMUSCULAR
  Filled 2016-01-28: qty 1

## 2016-01-28 MED ORDER — KETOROLAC TROMETHAMINE 60 MG/2ML IM SOLN
60.0000 mg | Freq: Once | INTRAMUSCULAR | Status: AC
  Administered 2016-01-28: 60 mg via INTRAMUSCULAR
  Filled 2016-01-28: qty 2

## 2016-01-28 MED ORDER — OXYCODONE-ACETAMINOPHEN 5-325 MG PO TABS: 1.0000 | ORAL_TABLET | ORAL | 0 refills | Status: DC | PRN

## 2016-01-28 NOTE — ED Notes (Signed)
SPOKE WITH TERRY, [facility] REGARDING PT RETURNING TO THEM. PER TERRY, RN HE NEEDS A COPY OF THE DISCHARGE PAPERWORK AND ANY MEDICATIONS GIVEN TO DETERMINE IF THE PT CAN RETURN TO THE FACILITY. PT, CHARGE NURSE, AND DR. BELFI MADE AWARE.

## 2016-01-28 NOTE — ED Notes (Signed)
MD at bedside. EDP BELFI PRESENT TO RE EVALUATE

## 2016-01-28 NOTE — ED Notes (Signed)
PT DISCHARGED. INSTRUCTIONS AND PRESCRIPTION GIVEN. AAOX4. PT IN NO APPARENT DISTRESS. THE OPPORTUNITY TO ASK QUESTIONS WAS PROVIDED. 

## 2016-01-28 NOTE — ED Notes (Signed)
Bed: WA04 Expected date:  Expected time:  Means of arrival:  Comments: EMS - knee pain

## 2016-01-28 NOTE — ED Provider Notes (Signed)
[facility]-EMERGENCY DEPT Provider Note   CSN: [id number] Arrival date & time: 01/28/16  6578     History   Chief Complaint Chief Complaint  Patient presents with  . Knee Injury    RT SIDE  . Knee Pain    HPI Kara [name] is a 30 y.o. female.  Patient is a 30 year old female who is currently in treatment for alcohol abuse at [facility]. She was playing volleyball yesterday and that she was running, she stopped and felt a pain and pop in her right knee. Since that time she's had swelling and pain to her right knee. She states her hurts with any movement. It hurts with ambulation. It radiates up to her hip and down to her knee. She denies any other injuries.    Knee Pain   Pertinent negatives include no numbness.    Past Medical History:  Diagnosis Date  . Alcohol abuse   . Asthma   . Environmental allergies   . GERD (gastroesophageal reflux disease)   . Headache   . Hypertension   . Pancreatitis   . Prolonged QT syndrome   . Syncope and collapse   . Vision abnormalities     Patient Active Problem List   Diagnosis Date Noted  . Pancreatitis 09/09/2015  . GERD (gastroesophageal reflux disease) 09/09/2015  . Benign essential HTN 09/09/2015  . FUO (fever of unknown origin)   . Abdominal hematoma 02/10/2015  . Warfarin-induced coagulopathy (HCC) 02/10/2015  . Hypertension 02/10/2015  . Hypoalbuminemia due to protein-calorie malnutrition (HCC) 02/10/2015  . Alcoholic pancreatitis 02/10/2015  . Acute blood loss anemia   . Intra-abdominal hematoma 02/09/2015  . Pancreatitis, acute   . 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 Acute pancreatitis 01/29/2015  . Sinus tachycardia 01/29/2015  . Leukocytosis 01/29/2015  . AKI (acute kidney injury) (HCC) 01/29/2015  . Insomnia 10/29/2014  . Polyneuropathy (HCC) 09/02/2014  . History of alcohol abuse 09/02/2014  . Edema 09/02/2014  . Neck pain 09/02/2014  . Chronic lower back pain 09/02/2014  . Numbness of foot  08/02/2014  . Hand numbness 08/02/2014  . Ataxic gait 08/02/2014  . Proximal leg weakness 08/02/2014  . Constipation   . Intractable abdominal pain 07/16/2014  . Hypothermia 07/16/2014  . Upper abdominal pain   . Abdominal pain, epigastric 07/06/2014  . Obesity (BMI 30-39.9) 07/06/2014  . Chronic diastolic heart failure (HCC) 07/06/2014  . Syncope 04/08/2014  . Prolonged Q-T interval on ECG 04/08/2014  . Hypokalemia 04/08/2014  . Alcohol abuse 04/08/2014  . Elevated LFTs 04/08/2014    Past Surgical History:  Procedure Laterality Date  . ESOPHAGOGASTRODUODENOSCOPY N/A 07/17/2014   Procedure: ESOPHAGOGASTRODUODENOSCOPY (EGD);  Surgeon: Dorena Cookey, MD;  Location: [facility];  Service: Endoscopy;  Laterality: N/A;  . TYMPANOSTOMY TUBE PLACEMENT      OB History    No data available       Home Medications    Prior to Admission medications   Medication Sig Start Date End Date Taking? Authorizing Provider  albuterol (PROVENTIL HFA;VENTOLIN HFA) 108 (90 Base) MCG/ACT inhaler Inhale 2 puffs into the lungs every 6 (six) hours as needed for wheezing or shortness of breath.   Yes Historical Provider, MD  DIAZEPAM PO Inject 10 mg into the muscle daily as needed (seizure).   Yes Historical Provider, MD  EPINEPHrine (EPIPEN 2-PAK) 0.3 mg/0.3 mL IJ SOAJ injection Inject 0.3 mg into the muscle once as needed (for severe allergic reaction).   Yes Historical Provider, MD  gabapentin (NEURONTIN) 300  MG capsule Take 900 mg by mouth 2 (two) times daily.   Yes Historical Provider, MD  metoprolol succinate (TOPROL-XL) 25 MG 24 hr tablet Take 1 tablet (25 mg total) by mouth daily. 06/09/15  Yes Wendall Stade, MD  mometasone-formoterol [facility]) 200-5 MCG/ACT AERO Inhale 2 puffs into the lungs 2 (two) times daily.   Yes Historical Provider, MD  montelukast (SINGULAIR) 10 MG tablet TAKE 1 TABLET BY MOUTH DAILY Patient taking differently: TAKE 1 TABLET BY MOUTH once daily at bedtime 12/08/15  Yes Jessica Priest, MD  Multiple Vitamins-Minerals (MULTIVITAMIN WITH MINERALS) tablet Take 1 tablet by mouth daily.   Yes Historical Provider, MD  omeprazole (PRILOSEC) 40 MG capsule TAKE 1 CAPSULE(40 MG) BY MOUTH DAILY Patient taking differently: TAKE 1 CAPSULE(40 MG) BY MOUTH  twice DAILY 01/13/16  Yes Jessica Priest, MD  QUEtiapine (SEROQUEL) 50 MG tablet Take 50 mg by mouth at bedtime.   Yes Historical Provider, MD  thiamine 100 MG tablet Take 100 mg by mouth daily.   Yes Historical Provider, MD  oxyCODONE-acetaminophen (PERCOCET) 5-325 MG tablet Take 1-2 tablets by mouth every 4 (four) hours as needed. 01/28/16   Rolan Bucco, MD    Family History Family History  Problem Relation Age of Onset  . Heart failure Mother   . Heart failure Father   . Heart failure Maternal Grandfather     Social History Social History  Substance Use Topics  . Smoking status: Current Every Day Smoker    Packs/day: 0.50    Years: 2.00    Types: Cigarettes    Last attempt to quit: 04/08/2012  . Smokeless tobacco: Never Used  . Alcohol use 1.8 oz/week    3 Shots of liquor per week     Allergies   Peanuts [peanut oil]   Review of Systems Review of Systems  Constitutional: Negative for fever.  Gastrointestinal: Negative for nausea and vomiting.  Musculoskeletal: Positive for arthralgias and joint swelling. Negative for back pain and neck pain.  Skin: Negative for wound.  Neurological: Negative for weakness, numbness and headaches.     Physical Exam Updated Vital Signs BP 133/97 (BP Location: Left Arm)   Pulse 80   Temp 98.5 F (36.9 C) (Oral)   Resp 16   Ht 5\' 5"  (1.651 m)   Wt 230 lb (104.3 kg)   LMP 12/29/2015   SpO2 98%   BMI 38.27 kg/m   Physical Exam  Constitutional: She is oriented to person, place, and time. She appears well-developed and well-nourished.  HENT:  Head: Normocephalic and atraumatic.  Neck: Normal range of motion. Neck supple.  Cardiovascular: Normal rate.     Pulmonary/Chest: Effort normal.  Musculoskeletal: She exhibits edema and tenderness.  Patient has swelling to her right knee with effusion present. There is generalized tenderness on any palpation or range of motion of the knee. Exam is limited due to her discomfort. I am unable to get an adequate ligament exam. She is able do a straight leg raise minimally but it appears that her patellar and quadriceps tendon are grossly intact. There is no pain on range of motion of the hip or the ankle. She has good perfusion distally to the foot. Pedal pulses are intact. She has normal sensation and motor function in the foot.  Neurological: She is alert and oriented to person, place, and time.  Skin: Skin is warm and dry.  Psychiatric: She has a normal mood and affect.     ED  Treatments / Results  Labs (all labs ordered are listed, but only abnormal results are displayed) Labs Reviewed - No data to display  EKG  EKG Interpretation None       Radiology Dg Knee Complete 4 Views Right  Result Date: 01/28/2016 CLINICAL DATA:  Patient injured right knee while running yesterday. Felt a pop and pain. Limited range of motion. EXAM: RIGHT KNEE - COMPLETE 4+ VIEW COMPARISON:  None. FINDINGS: Four views study shows any minimally depressed fracture of the lateral tibial plateau with associated lipohemarthrosis. IMPRESSION: Lateral tibial plateau fracture with lipohemarthrosis. Electronically Signed   By: Kennith CenterEric  Mansell M.D.   On: 01/28/2016 09:24    Procedures Procedures (including critical care time)  Medications Ordered in ED Medications  ketorolac (TORADOL) injection 60 mg (60 mg Intramuscular Given 01/28/16 0859)  HYDROmorphone (DILAUDID) injection 1 mg (1 mg Intramuscular Given 01/28/16 0951)     Initial Impression / Assessment and Plan / ED Course  I have reviewed the triage vital signs and the nursing notes.  Pertinent labs & imaging results that were available during my care of the patient  were reviewed by me and considered in my medical decision making (see chart for details).  Clinical Course    Patient has evidence of a lateral tibial plateau fracture. She was given pain medication in the ED. I spoke with the PA working with Dr. Turner Danielsowan.  The x-rays were reviewed by Dr. Seleta Rhymesowand and it was felt the patient can be managed in a knee immobilizer. They will follow her up in the office within the next 1-2 days. I reviewed with patient these instructions to call make an appointment to be seen within the next 1-2 days. She was given a prescription for Percocet for pain management. She was advised in strict nonweightbearing. She states that she has crutches at home. She was advised to use a walker if she has difficulty with his crutches. She was advised to keep her leg elevated as much as possible.  Final Clinical Impressions(s) / ED Diagnoses   Final diagnoses:  Closed fracture of right tibial plateau, initial encounter    New Prescriptions New Prescriptions   OXYCODONE-ACETAMINOPHEN (PERCOCET) 5-325 MG TABLET    Take 1-2 tablets by mouth every 4 (four) hours as needed.     Rolan BuccoMelanie Felicita Nuncio, MD 01/28/16 1114

## 2016-01-28 NOTE — ED Triage Notes (Signed)
Per [facility]- Pt c/o of Rt knee injury from playing volleyball yesterday. No deformity present however swelling present. Pt able to move with increased pain and tenderness. Pain relieved with elevation and ice. Pulses presents

## 2016-01-28 NOTE — ED Notes (Signed)
PT CUR[facility]LY [facility] ETOH (13 DAYS)

## 2016-01-28 NOTE — ED Notes (Signed)
Bed: WHALA Expected date:  Expected time:  Means of arrival:  Comments: 

## 2016-01-28 NOTE — ED Notes (Signed)
SECOND FAX SENT TO [facility]. PT DISCHARGED TO HUSBAND, SHE STS HE WILL TAKE HER TO GET  HER THINGS FROM THE FACILITY.

## 2016-01-28 NOTE — ED Notes (Signed)
MD at bedside. EDP BELFI

## 2016-06-05 ENCOUNTER — Other Ambulatory Visit: Payer: Self-pay | Admitting: Cardiovascular Disease

## 2016-06-12 ENCOUNTER — Emergency Department (HOSPITAL_COMMUNITY): Payer: BC Managed Care – PPO

## 2016-06-12 ENCOUNTER — Encounter (HOSPITAL_COMMUNITY): Payer: Self-pay | Admitting: Nurse Practitioner

## 2016-06-12 ENCOUNTER — Inpatient Hospital Stay (HOSPITAL_COMMUNITY)
Admission: EM | Admit: 2016-06-12 | Discharge: 2016-06-14 | DRG: 189 | Disposition: A | Discharge status: Home / Self Care | Payer: BC Managed Care – PPO | Attending: Family Medicine | Admitting: Family Medicine

## 2016-06-12 DIAGNOSIS — G621 Alcoholic polyneuropathy: Secondary | ICD-10-CM | POA: Diagnosis present

## 2016-06-12 DIAGNOSIS — J4551 Severe persistent asthma with (acute) exacerbation: Secondary | ICD-10-CM

## 2016-06-12 DIAGNOSIS — K219 Gastro-esophageal reflux disease without esophagitis: Secondary | ICD-10-CM | POA: Diagnosis present

## 2016-06-12 DIAGNOSIS — Z8249 Family history of ischemic heart disease and other diseases of the circulatory system: Secondary | ICD-10-CM

## 2016-06-12 DIAGNOSIS — I4581 Long QT syndrome: Secondary | ICD-10-CM | POA: Diagnosis present

## 2016-06-12 DIAGNOSIS — Z7951 Long term (current) use of inhaled steroids: Secondary | ICD-10-CM | POA: Diagnosis not present

## 2016-06-12 DIAGNOSIS — R000 Tachycardia, unspecified: Secondary | ICD-10-CM | POA: Diagnosis present

## 2016-06-12 DIAGNOSIS — F17210 Nicotine dependence, cigarettes, uncomplicated: Secondary | ICD-10-CM | POA: Diagnosis present

## 2016-06-12 DIAGNOSIS — I5032 Chronic diastolic (congestive) heart failure: Secondary | ICD-10-CM | POA: Diagnosis present

## 2016-06-12 DIAGNOSIS — R0600 Dyspnea, unspecified: Secondary | ICD-10-CM | POA: Diagnosis not present

## 2016-06-12 DIAGNOSIS — M545 Low back pain, unspecified: Secondary | ICD-10-CM | POA: Diagnosis present

## 2016-06-12 DIAGNOSIS — D72825 Bandemia: Secondary | ICD-10-CM | POA: Diagnosis not present

## 2016-06-12 DIAGNOSIS — J309 Allergic rhinitis, unspecified: Secondary | ICD-10-CM | POA: Diagnosis present

## 2016-06-12 DIAGNOSIS — I110 Hypertensive heart disease with heart failure: Secondary | ICD-10-CM | POA: Diagnosis present

## 2016-06-12 DIAGNOSIS — G8929 Other chronic pain: Secondary | ICD-10-CM | POA: Diagnosis present

## 2016-06-12 DIAGNOSIS — Z91010 Allergy to peanuts: Secondary | ICD-10-CM

## 2016-06-12 DIAGNOSIS — J9601 Acute respiratory failure with hypoxia: Secondary | ICD-10-CM | POA: Diagnosis present

## 2016-06-12 DIAGNOSIS — R0602 Shortness of breath: Secondary | ICD-10-CM | POA: Diagnosis not present

## 2016-06-12 DIAGNOSIS — D72829 Elevated white blood cell count, unspecified: Secondary | ICD-10-CM | POA: Diagnosis present

## 2016-06-12 DIAGNOSIS — J45901 Unspecified asthma with (acute) exacerbation: Secondary | ICD-10-CM | POA: Diagnosis present

## 2016-06-12 DIAGNOSIS — A419 Sepsis, unspecified organism: Secondary | ICD-10-CM

## 2016-06-12 DIAGNOSIS — E872 Acidosis: Secondary | ICD-10-CM | POA: Diagnosis present

## 2016-06-12 DIAGNOSIS — F1011 Alcohol abuse, in remission: Secondary | ICD-10-CM | POA: Diagnosis present

## 2016-06-12 DIAGNOSIS — Z791 Long term (current) use of non-steroidal anti-inflammatories (NSAID): Secondary | ICD-10-CM

## 2016-06-12 DIAGNOSIS — J189 Pneumonia, unspecified organism: Secondary | ICD-10-CM | POA: Diagnosis present

## 2016-06-12 DIAGNOSIS — Z8619 Personal history of other infectious and parasitic diseases: Secondary | ICD-10-CM

## 2016-06-12 DIAGNOSIS — F1010 Alcohol abuse, uncomplicated: Secondary | ICD-10-CM | POA: Diagnosis present

## 2016-06-12 DIAGNOSIS — Z79899 Other long term (current) drug therapy: Secondary | ICD-10-CM

## 2016-06-12 DIAGNOSIS — I10 Essential (primary) hypertension: Secondary | ICD-10-CM | POA: Diagnosis present

## 2016-06-12 DIAGNOSIS — Z9622 Myringotomy tube(s) status: Secondary | ICD-10-CM | POA: Diagnosis present

## 2016-06-12 DIAGNOSIS — R739 Hyperglycemia, unspecified: Secondary | ICD-10-CM | POA: Diagnosis present

## 2016-06-12 DIAGNOSIS — J9600 Acute respiratory failure, unspecified whether with hypoxia or hypercapnia: Secondary | ICD-10-CM

## 2016-06-12 HISTORY — DX: Sepsis, unspecified organism: A41.9

## 2016-06-12 HISTORY — DX: Personal history of other infectious and parasitic diseases: Z86.19

## 2016-06-12 LAB — URINALYSIS, ROUTINE W REFLEX MICROSCOPIC
Bacteria, UA: NONE SEEN
Bilirubin Urine: NEGATIVE
GLUCOSE, UA: NEGATIVE mg/dL
Ketones, ur: NEGATIVE mg/dL
LEUKOCYTES UA: NEGATIVE
NITRITE: NEGATIVE
PH: 5 (ref 5.0–8.0)
Protein, ur: 30 mg/dL — AB
SPECIFIC GRAVITY, URINE: 1.02 (ref 1.005–1.030)

## 2016-06-12 LAB — BLOOD GAS, ARTERIAL
Acid-Base Excess: 1.2 mmol/L (ref 0.0–2.0)
Bicarbonate: 26.2 mmol/L (ref 20.0–28.0)
DRAWN BY: 295031
Delivery systems: POSITIVE
Expiratory PAP: 7
FIO2: 50
Inspiratory PAP: 14
LHR: 20 {breaths}/min
Mode: POSITIVE
O2 Saturation: 99 %
PATIENT TEMPERATURE: 98.6
PCO2 ART: 45.1 mmHg (ref 32.0–48.0)
PH ART: 7.381 (ref 7.350–7.450)
PO2 ART: 179 mmHg — AB (ref 83.0–108.0)

## 2016-06-12 LAB — CBC
HCT: 38.9 % (ref 36.0–46.0)
Hemoglobin: 13 g/dL (ref 12.0–15.0)
MCH: 28.2 pg (ref 26.0–34.0)
MCHC: 33.4 g/dL (ref 30.0–36.0)
MCV: 84.4 fL (ref 78.0–100.0)
PLATELETS: 262 10*3/uL (ref 150–400)
RBC: 4.61 MIL/uL (ref 3.87–5.11)
RDW: 13.6 % (ref 11.5–15.5)
WBC: 12.6 10*3/uL — AB (ref 4.0–10.5)

## 2016-06-12 LAB — D-DIMER, QUANTITATIVE (NOT AT ARMC): D DIMER QUANT: 1.12 ug{FEU}/mL — AB (ref 0.00–0.50)

## 2016-06-12 LAB — CBC WITH DIFFERENTIAL/PLATELET
BASOS PCT: 0 %
Basophils Absolute: 0 10*3/uL (ref 0.0–0.1)
EOS ABS: 0.1 10*3/uL (ref 0.0–0.7)
Eosinophils Relative: 1 %
HCT: 38.6 % (ref 36.0–46.0)
HEMOGLOBIN: 12.9 g/dL (ref 12.0–15.0)
LYMPHS ABS: 0.4 10*3/uL — AB (ref 0.7–4.0)
Lymphocytes Relative: 4 %
MCH: 28.2 pg (ref 26.0–34.0)
MCHC: 33.4 g/dL (ref 30.0–36.0)
MCV: 84.3 fL (ref 78.0–100.0)
Monocytes Absolute: 0.1 10*3/uL (ref 0.1–1.0)
Monocytes Relative: 1 %
NEUTROS PCT: 94 %
Neutro Abs: 9.9 10*3/uL — ABNORMAL HIGH (ref 1.7–7.7)
Platelets: 269 10*3/uL (ref 150–400)
RBC: 4.58 MIL/uL (ref 3.87–5.11)
RDW: 13.5 % (ref 11.5–15.5)
WBC: 10.5 10*3/uL (ref 4.0–10.5)

## 2016-06-12 LAB — BASIC METABOLIC PANEL
ANION GAP: 7 (ref 5–15)
BUN: 11 mg/dL (ref 6–20)
CALCIUM: 8.8 mg/dL — AB (ref 8.9–10.3)
CO2: 26 mmol/L (ref 22–32)
CREATININE: 0.6 mg/dL (ref 0.44–1.00)
Chloride: 103 mmol/L (ref 101–111)
GLUCOSE: 196 mg/dL — AB (ref 65–99)
Potassium: 4 mmol/L (ref 3.5–5.1)
Sodium: 136 mmol/L (ref 135–145)

## 2016-06-12 LAB — RAPID URINE DRUG SCREEN, HOSP PERFORMED
AMPHETAMINES: NOT DETECTED
Barbiturates: NOT DETECTED
Benzodiazepines: NOT DETECTED
Cocaine: NOT DETECTED
Opiates: POSITIVE — AB
TETRAHYDROCANNABINOL: NOT DETECTED

## 2016-06-12 LAB — APTT: aPTT: 29 seconds (ref 24–36)

## 2016-06-12 LAB — LACTIC ACID, PLASMA: Lactic Acid, Venous: 1.8 mmol/L (ref 0.5–1.9)

## 2016-06-12 LAB — INFLUENZA PANEL BY PCR (TYPE A & B)
Influenza A By PCR: NEGATIVE
Influenza B By PCR: NEGATIVE

## 2016-06-12 LAB — MRSA PCR SCREENING: MRSA BY PCR: NEGATIVE

## 2016-06-12 LAB — BRAIN NATRIURETIC PEPTIDE: B Natriuretic Peptide: 21.7 pg/mL (ref 0.0–100.0)

## 2016-06-12 LAB — PROTIME-INR
INR: 1.01
PROTHROMBIN TIME: 13.3 s (ref 11.4–15.2)

## 2016-06-12 LAB — PROCALCITONIN: Procalcitonin: <0.1 ng/mL

## 2016-06-12 LAB — TROPONIN I

## 2016-06-12 MED ORDER — METHYLPREDNISOLONE SODIUM SUCC 125 MG IJ SOLR: 60.0000 mg | Freq: Two times a day (BID) | INTRAMUSCULAR | Status: DC

## 2016-06-12 MED ORDER — ALBUTEROL SULFATE (2.5 MG/3ML) 0.083% IN NEBU
5.0000 mg | INHALATION_SOLUTION | Freq: Once | RESPIRATORY_TRACT | Status: AC
  Administered 2016-06-12: 5 mg via RESPIRATORY_TRACT
  Filled 2016-06-12: qty 6

## 2016-06-12 MED ORDER — PANTOPRAZOLE SODIUM 40 MG PO TBEC
40.0000 mg | DELAYED_RELEASE_TABLET | Freq: Every day | ORAL | Status: DC
  Administered 2016-06-12 – 2016-06-14 (×3): 40 mg via ORAL
  Filled 2016-06-12 (×3): qty 1

## 2016-06-12 MED ORDER — SODIUM CHLORIDE 0.9% FLUSH: 3.0000 mL | INTRAVENOUS | Status: DC | PRN

## 2016-06-12 MED ORDER — VANCOMYCIN HCL 10 G IV SOLR
1500.0000 mg | Freq: Once | INTRAVENOUS | Status: AC
  Administered 2016-06-12: 1500 mg via INTRAVENOUS
  Filled 2016-06-12: qty 1500

## 2016-06-12 MED ORDER — ALBUTEROL (5 MG/ML) CONTINUOUS INHALATION SOLN
10.0000 mg/h | INHALATION_SOLUTION | Freq: Once | RESPIRATORY_TRACT | Status: AC
  Administered 2016-06-12: 10 mg/h via RESPIRATORY_TRACT
  Filled 2016-06-12: qty 20

## 2016-06-12 MED ORDER — MAGNESIUM SULFATE 2 GM/50ML IV SOLN
2.0000 g | Freq: Once | INTRAVENOUS | Status: AC
  Administered 2016-06-12: 2 g via INTRAVENOUS
  Filled 2016-06-12: qty 50

## 2016-06-12 MED ORDER — SODIUM CHLORIDE 0.9% FLUSH
3.0000 mL | Freq: Two times a day (BID) | INTRAVENOUS | Status: DC
  Administered 2016-06-13 – 2016-06-14 (×2): 3 mL via INTRAVENOUS

## 2016-06-12 MED ORDER — IPRATROPIUM-ALBUTEROL 0.5-2.5 (3) MG/3ML IN SOLN
RESPIRATORY_TRACT | Status: AC
  Filled 2016-06-12: qty 3

## 2016-06-12 MED ORDER — PREDNISONE 20 MG PO TABS: 40.0000 mg | ORAL_TABLET | Freq: Every day | ORAL | Status: DC

## 2016-06-12 MED ORDER — HYDROCODONE-ACETAMINOPHEN 5-325 MG PO TABS: 1.0000 | ORAL_TABLET | ORAL | Status: DC | PRN

## 2016-06-12 MED ORDER — PIPERACILLIN-TAZOBACTAM 3.375 G IVPB
3.3750 g | Freq: Three times a day (TID) | INTRAVENOUS | Status: DC
  Administered 2016-06-12 – 2016-06-14 (×6): 3.375 g via INTRAVENOUS
  Filled 2016-06-12 (×6): qty 50

## 2016-06-12 MED ORDER — DEXTROSE 5 % IV SOLN
1.0000 g | Freq: Once | INTRAVENOUS | Status: AC
  Administered 2016-06-12: 1 g via INTRAVENOUS
  Filled 2016-06-12: qty 10

## 2016-06-12 MED ORDER — NALTREXONE HCL 50 MG PO TABS
50.0000 mg | ORAL_TABLET | Freq: Every day | ORAL | Status: DC
  Administered 2016-06-13 – 2016-06-14 (×2): 50 mg via ORAL
  Filled 2016-06-12 (×2): qty 1

## 2016-06-12 MED ORDER — ACETAMINOPHEN 325 MG PO TABS: 650.0000 mg | ORAL_TABLET | Freq: Four times a day (QID) | ORAL | Status: DC | PRN

## 2016-06-12 MED ORDER — QUETIAPINE FUMARATE 100 MG PO TABS
100.0000 mg | ORAL_TABLET | Freq: Every day | ORAL | Status: DC
  Administered 2016-06-12 – 2016-06-13 (×2): 100 mg via ORAL
  Filled 2016-06-12 (×2): qty 1

## 2016-06-12 MED ORDER — METHYLPREDNISOLONE SODIUM SUCC 40 MG IJ SOLR
40.0000 mg | Freq: Four times a day (QID) | INTRAMUSCULAR | Status: DC
  Administered 2016-06-12 – 2016-06-13 (×2): 40 mg via INTRAVENOUS
  Filled 2016-06-12 (×2): qty 1

## 2016-06-12 MED ORDER — LEVALBUTEROL HCL 1.25 MG/0.5ML IN NEBU: 1.2500 mg | INHALATION_SOLUTION | RESPIRATORY_TRACT | Status: DC | PRN

## 2016-06-12 MED ORDER — ENOXAPARIN SODIUM 40 MG/0.4ML ~~LOC~~ SOLN
40.0000 mg | Freq: Every day | SUBCUTANEOUS | Status: DC
  Administered 2016-06-12 – 2016-06-13 (×2): 40 mg via SUBCUTANEOUS
  Filled 2016-06-12 (×2): qty 0.4

## 2016-06-12 MED ORDER — ONDANSETRON HCL 4 MG/2ML IJ SOLN: 4.0000 mg | Freq: Four times a day (QID) | INTRAMUSCULAR | Status: DC | PRN

## 2016-06-12 MED ORDER — NICOTINE 14 MG/24HR TD PT24
14.0000 mg | MEDICATED_PATCH | Freq: Every day | TRANSDERMAL | Status: DC
  Administered 2016-06-12 – 2016-06-14 (×3): 14 mg via TRANSDERMAL
  Filled 2016-06-12 (×3): qty 1

## 2016-06-12 MED ORDER — ACETAMINOPHEN 650 MG RE SUPP: 650.0000 mg | Freq: Four times a day (QID) | RECTAL | Status: DC | PRN

## 2016-06-12 MED ORDER — DEXTROSE 5 % IV SOLN
500.0000 mg | Freq: Once | INTRAVENOUS | Status: AC
  Administered 2016-06-12: 500 mg via INTRAVENOUS
  Filled 2016-06-12: qty 500

## 2016-06-12 MED ORDER — IPRATROPIUM-ALBUTEROL 0.5-2.5 (3) MG/3ML IN SOLN
3.0000 mL | RESPIRATORY_TRACT | Status: DC
  Administered 2016-06-12 – 2016-06-14 (×8): 3 mL via RESPIRATORY_TRACT
  Filled 2016-06-12 (×7): qty 3

## 2016-06-12 MED ORDER — DEXTROSE 5 % IV SOLN
500.0000 mg | INTRAVENOUS | Status: DC
  Administered 2016-06-13: 500 mg via INTRAVENOUS
  Filled 2016-06-12: qty 500

## 2016-06-12 MED ORDER — GABAPENTIN 300 MG PO CAPS
900.0000 mg | ORAL_CAPSULE | Freq: Two times a day (BID) | ORAL | Status: DC
Start: 2016-06-12 — End: 2016-06-14
  Administered 2016-06-12 – 2016-06-14 (×4): 900 mg via ORAL
  Filled 2016-06-12 (×4): qty 3

## 2016-06-12 MED ORDER — IPRATROPIUM-ALBUTEROL 0.5-2.5 (3) MG/3ML IN SOLN
3.0000 mL | Freq: Four times a day (QID) | RESPIRATORY_TRACT | Status: DC
  Administered 2016-06-12: 3 mL via RESPIRATORY_TRACT
  Filled 2016-06-12: qty 3

## 2016-06-12 MED ORDER — ONDANSETRON HCL 4 MG PO TABS: 4.0000 mg | ORAL_TABLET | Freq: Four times a day (QID) | ORAL | Status: DC | PRN

## 2016-06-12 MED ORDER — GUAIFENESIN ER 600 MG PO TB12
600.0000 mg | ORAL_TABLET | Freq: Two times a day (BID) | ORAL | Status: DC
  Administered 2016-06-12 – 2016-06-14 (×4): 600 mg via ORAL
  Filled 2016-06-12 (×4): qty 1

## 2016-06-12 MED ORDER — MONTELUKAST SODIUM 10 MG PO TABS
10.0000 mg | ORAL_TABLET | Freq: Every day | ORAL | Status: DC
  Administered 2016-06-12 – 2016-06-13 (×2): 10 mg via ORAL
  Filled 2016-06-12 (×2): qty 1

## 2016-06-12 MED ORDER — SERTRALINE HCL 50 MG PO TABS
75.0000 mg | ORAL_TABLET | Freq: Every day | ORAL | Status: DC
  Administered 2016-06-13 – 2016-06-14 (×2): 75 mg via ORAL
  Filled 2016-06-12 (×2): qty 2

## 2016-06-12 MED ORDER — SODIUM CHLORIDE 0.9 % IV SOLN: 250.0000 mL | INTRAVENOUS | Status: DC | PRN

## 2016-06-12 MED ORDER — VANCOMYCIN HCL IN DEXTROSE 1-5 GM/200ML-% IV SOLN
1000.0000 mg | Freq: Three times a day (TID) | INTRAVENOUS | Status: DC
  Administered 2016-06-13 – 2016-06-14 (×5): 1000 mg via INTRAVENOUS
  Filled 2016-06-12 (×5): qty 200

## 2016-06-12 NOTE — ED Provider Notes (Signed)
[facility]-EMERGENCY DEPT Provider Note   CSN: [id number] Arrival date & time: 06/12/16  1547     History   Chief Complaint Chief Complaint  Patient presents with  . Respiratory Distress  . Pneumonia    HPI Kara [name] is a 31 y.o. female.  HPI Patient reports increasing shortness of breath over the past several days.  Patient diagnosed with pneumonia yesterday by the primary care physician where she was apparently 85% on room air in the office prior to her breathing treatment.  She was nearly sent to the ER yesterday but the patient ended up going home with antibiotics and breathing treatments and steroids.  She has a history of asthma.  She reports worsening shortness of breath today.  EMS reports severe respiratory distress with minimal air movement on arrival.  She was given IV site Medrol and albuterol and started on Cipro.  On arrival to emergency department she is having some improvement in her symptoms but is still extremity short of breath.  She was started on Levaquin yesterday.  Symptoms at this time is severe.  No other complaints.   Past Medical History:  Diagnosis Date  . Alcohol abuse   . Asthma   . Environmental allergies   . GERD (gastroesophageal reflux disease)   . Headache   . Hypertension   . Pancreatitis   . Prolonged QT syndrome   . Syncope and collapse   . Vision abnormalities     Patient Active Problem List   Diagnosis Date Noted  . Pancreatitis 09/09/2015  . GERD (gastroesophageal reflux disease) 09/09/2015  . Benign essential HTN 09/09/2015  . FUO (fever of unknown origin)   . Abdominal hematoma 02/10/2015  . Warfarin-induced coagulopathy (HCC) 02/10/2015  . Hypertension 02/10/2015  . Hypoalbuminemia due to protein-calorie malnutrition (HCC) 02/10/2015  . Alcoholic pancreatitis 02/10/2015  . Acute blood loss anemia   . Intra-abdominal hematoma 02/09/2015  . Pancreatitis, acute   . 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 Acute pancreatitis  01/29/2015  . Sinus tachycardia 01/29/2015  . Leukocytosis 01/29/2015  . AKI (acute kidney injury) (HCC) 01/29/2015  . Insomnia 10/29/2014  . Polyneuropathy (HCC) 09/02/2014  . History of alcohol abuse 09/02/2014  . Edema 09/02/2014  . Neck pain 09/02/2014  . Chronic lower back pain 09/02/2014  . Numbness of foot 08/02/2014  . Hand numbness 08/02/2014  . Ataxic gait 08/02/2014  . Proximal leg weakness 08/02/2014  . Constipation   . Intractable abdominal pain 07/16/2014  . Hypothermia 07/16/2014  . Upper abdominal pain   . Abdominal pain, epigastric 07/06/2014  . Obesity (BMI 30-39.9) 07/06/2014  . Chronic diastolic heart failure (HCC) 07/06/2014  . Syncope 04/08/2014  . Prolonged Q-T interval on ECG 04/08/2014  . Hypokalemia 04/08/2014  . Alcohol abuse 04/08/2014  . Elevated LFTs 04/08/2014    Past Surgical History:  Procedure Laterality Date  . ESOPHAGOGASTRODUODENOSCOPY N/A 07/17/2014   Procedure: ESOPHAGOGASTRODUODENOSCOPY (EGD);  Surgeon: Dorena CookeyJohn Hayes, MD;  [facility];  Service: Endoscopy;  Laterality: N/A;  . TYMPANOSTOMY TUBE PLACEMENT      OB History    No data available       Home Medications    Prior to Admission medications   Medication Sig Start Date End Date Taking? Authorizing Provider  albuterol (PROVENTIL HFA;VENTOLIN HFA) 108 (90 Base) MCG/ACT inhaler Inhale 2 puffs into the lungs every 6 (six) hours as needed for wheezing or shortness of breath.   Yes Historical Provider, MD  albuterol (PROVENTIL) (2.5 MG/3ML) 0.083% nebulizer  solution Take 3 mLs by nebulization as needed. 06/11/16  Yes Historical Provider, MD  Cholecalciferol (VITAMIN D PO) Take 1 tablet by mouth at bedtime.   Yes Historical Provider, MD  gabapentin (NEURONTIN) 300 MG capsule Take 900 mg by mouth 2 (two) times daily.   Yes Historical Provider, MD  levofloxacin (LEVAQUIN) 500 MG tablet Take 500 mg by mouth daily. Started 2/23 x 10 days 06/11/16  Yes Historical Provider, MD    Melatonin 10 MG TABS Take 10 mg by mouth at bedtime.   Yes Historical Provider, MD  meloxicam (MOBIC) 15 MG tablet Take 15 mg by mouth daily.   Yes Historical Provider, MD  metoprolol succinate (TOPROL-XL) 25 MG 24 hr tablet Take 1 tablet (25 mg total) by mouth daily. *Please call and schedule a one year follow up appointment* 06/07/16  Yes Wendall Stade, MD  mometasone-formoterol [facility]) 200-5 MCG/ACT AERO Inhale 2 puffs into the lungs 2 (two) times daily.   Yes Historical Provider, MD  montelukast (SINGULAIR) 10 MG tablet TAKE 1 TABLET BY MOUTH DAILY Patient taking differently: TAKE 1 TABLET BY MOUTH once daily at bedtime 12/08/15  Yes Jessica Priest, MD  Multiple Vitamins-Minerals (MULTIVITAMIN WITH MINERALS) tablet Take 1 tablet by mouth daily.   Yes Historical Provider, MD  naltrexone (DEPADE) 50 MG tablet Take 50 mg by mouth daily.   Yes Historical Provider, MD  omeprazole (PRILOSEC) 40 MG capsule TAKE 1 CAPSULE(40 MG) BY MOUTH DAILY Patient taking differently: TAKE 1 CAPSULE(40 MG) BY MOUTH  twice DAILY 01/13/16  Yes Jessica Priest, MD  QUEtiapine (SEROQUEL) 50 MG tablet Take 100 mg by mouth at bedtime.    Yes Historical Provider, MD  sertraline (ZOLOFT) 50 MG tablet Take 75 mg by mouth daily.   Yes Historical Provider, MD  thiamine 100 MG tablet Take 100 mg by mouth daily.   Yes Historical Provider, MD  EPINEPHrine (EPIPEN 2-PAK) 0.3 mg/0.3 mL IJ SOAJ injection Inject 0.3 mg into the muscle once as needed (for severe allergic reaction).    Historical Provider, MD  metoprolol succinate (TOPROL-XL) 25 MG 24 hr tablet Take 1 tablet (25 mg total) by mouth daily. Patient not taking: Reported on 06/12/2016 06/09/15   Wendall Stade, MD  oxyCODONE-acetaminophen (PERCOCET) 5-325 MG tablet Take 1-2 tablets by mouth every 4 (four) hours as needed. Patient not taking: Reported on 06/12/2016 01/28/16   Rolan Bucco, MD    Family History Family History  Problem Relation Age of Onset  . Heart failure  Mother   . Heart failure Father   . Heart failure Maternal Grandfather     Social History Social History  Substance Use Topics  . Smoking status: Current Every Day Smoker    Packs/day: 0.50    Years: 2.00    Types: Cigarettes    Last attempt to quit: 04/08/2012  . Smokeless tobacco: Never Used  . Alcohol use 1.8 oz/week    3 Shots of liquor per week     Allergies   Peanuts [peanut oil]   Review of Systems Review of Systems  All other systems reviewed and are negative.    Physical Exam Updated Vital Signs BP 119/76   Pulse 112   Resp 26   SpO2 91%   Physical Exam  Constitutional: She is oriented to person, place, and time. She appears well-developed and well-nourished. No distress.  HENT:  Head: Normocephalic and atraumatic.  Eyes: EOM are normal.  Neck: Normal range of motion.  Cardiovascular:  Normal rate, regular rhythm and normal heart sounds.   Pulmonary/Chest: Effort normal and breath sounds normal.  Abdominal: Soft. She exhibits no distension. There is no tenderness.  Musculoskeletal: Normal range of motion.  Neurological: She is alert and oriented to person, place, and time.  Skin: Skin is warm and dry.  Psychiatric: She has a normal mood and affect. Judgment normal.  Nursing note and vitals reviewed.    ED Treatments / Results  Labs (all labs ordered are listed, but only abnormal results are displayed) Labs Reviewed  CBC - Abnormal; Notable for the following:       Result Value   WBC 12.6 (*)    All other components within normal limits  BASIC METABOLIC PANEL - Abnormal; Notable for the following:    Glucose, Bld 196 (*)    Calcium 8.8 (*)    All other components within normal limits  BLOOD GAS, ARTERIAL - Abnormal; Notable for the following:    pO2, Arterial 179 (*)    All other components within normal limits  INFLUENZA PANEL BY PCR (TYPE A & B)    EKG  EKG Interpretation  Date/Time:  Saturday June 12 2016 15:50:34  EST Ventricular Rate:  121 PR Interval:    QRS Duration: 99 QT Interval:  328 QTC Calculation: 466 R Axis:   98 Text Interpretation:  Sinus tachycardia Borderline right axis deviation Low voltage, precordial leads Baseline wander in lead(s) II III aVF No significant change was found Confirmed by Laressa Bolinger  MD, Caryn Bee ([id number]) on 06/12/2016 5:42:27 PM       Radiology Dg Chest Portable 1 View  Result Date: 06/12/2016 CLINICAL DATA:  Shortness of breath, cough, and weakness for several days. Hypoxia. EXAM: PORTABLE CHEST 1 VIEW COMPARISON:  06/11/2016 FINDINGS: The heart size and mediastinal contours are within normal limits. Diffuse interstitial infiltrates and patchy bibasilar predominant airspace disease shows no significant interval change. No evidence of pleural effusion or pneumothorax. IMPRESSION: Stable diffuse bilateral interstitial and airspace disease. Differential considerations include infection, edema, and hemorrhage. Electronically Signed   By: Myles Rosenthal M.D.   On: 06/12/2016 16:41    Procedures .Critical Care Performed by: Azalia Bilis Authorized by: Azalia Bilis   Critical care provider statement:    Critical care time (minutes):  35   Critical care time was exclusive of:  Separately billable procedures and treating other patients   Critical care was necessary to treat or prevent imminent or life-threatening deterioration of the following conditions:  Respiratory failure   Critical care was time spent personally by me on the following activities:  Examination of patient, evaluation of patient's response to treatment, discussions with consultants, development of treatment plan with patient or surrogate, ordering and performing treatments and interventions, ordering and review of laboratory studies, ordering and review of radiographic studies, pulse oximetry, re-evaluation of patient's condition, review of old charts and obtaining history from patient or surrogate   (including  critical care time)  Medications Ordered in ED Medications  albuterol (PROVENTIL) (2.5 MG/3ML) 0.083% nebulizer solution 5 mg (not administered)  magnesium sulfate IVPB 2 g 50 mL (0 g Intravenous Stopped 06/12/16 1622)  albuterol (PROVENTIL,VENTOLIN) solution continuous neb (10 mg/hr Nebulization Given 06/12/16 1614)  cefTRIAXone (ROCEPHIN) 1 g in dextrose 5 % 50 mL IVPB (0 g Intravenous Stopped 06/12/16 1838)  azithromycin (ZITHROMAX) 500 mg in dextrose 5 % 250 mL IVPB (0 mg Intravenous Stopped 06/12/16 1908)     Initial Impression / Assessment and Plan / ED Course  I have reviewed the triage vital signs and the nursing notes.  Pertinent labs & imaging results that were available during my care of the patient were reviewed by me and considered in my medical decision making (see chart for details).    Improvement while emergency department.  Patient was able to be weaned off of BiPAP.  She was initially placed on BiPAP on arrival for severe respiratory distress.  She was also started on a continuous nebulized treatment at this time.  She received Solu-Medrol route.  She received IV magnesium here in emergency department.  She's making improvement but still short of breath.  Repeat breathing treatment now.  Admission to the hospital.  Final Clinical Impressions(s) / ED Diagnoses   Final diagnoses:  Acute respiratory failure, unspecified whether with hypoxia or hypercapnia (HCC)  Severe persistent asthma with exacerbation    New Prescriptions New Prescriptions   No medications on file     Azalia Bilis, MD 06/12/16 1931

## 2016-06-12 NOTE — H&P (Addendum)
Kara [name] ZOX:[id number] DOB: 12-15-1985 DOA: 06/12/2016     PCP: Duane Lope, MD   Outpatient Specialists: Francee Piccolo, Neurology [place], cardiology Eden Emms, pssychiatry [place] Patient coming from:  home Lives With family   Chief Complaint:shortness of breath  HPI: Kara [name] is a 31 y.o. female with medical history significant of alcohol abuse, asthma and allergic rhinitis and reflux and food allergy, alcohol pancreatitis, s/p splenic vein thrombosis, hx of Prolonged QTC, polyneuropathy    Presented with severe shortness of breath. Patient have been having cough and intermittent fevers up to 102 at home for past 2 weeks she presented to her primary care provider seems like a week ago was treated with Z-Pak and steroids and inhalers but did not seem to help patient went again yesterday chest x-ray was done and she was started on Levaquin and steroids. 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 She normally does not get severe asthma exacerbations this is the worse asthma exacerbation she have ever had. Reports unable to lay down flat. Gets significant shortness of breath with minimal exertion. Denies any hemoptysis. Cough productive of yellow sputum. Today she developed severe shortness of breath and wheezing called EMS on their arrival patient was in respiratory distress diaphoretic having trouble moving any air patient was hypoxic down to 85% room air. EMS administered IV Solu-Medrol ordered on BiPap on arrival to emerge department patient was given magnesium and started to improve currently able to be weaned off of BiPap but continues to have significant shortness of breath and wheezing she ambulated to the bathroom and became tachypnea can BiPAP was restarted.     Regarding alcohol abuse patient has been in treatment at [facility] and c currently in remission for past 2 months  Regarding pertinent  Chronic problems: Patient and multiple medical problems she has known history of asthma but usually this is under good control she is to see allergist Dr. Sharyn Lull for this. Patient has history of alcohol abuse polyneuropathy for which she's been seen by neurology in July and takes Neurontin.   IN ER:  No data recorded.     96% 2L HR 110  Influenza negative WBC 12.6 which is close to baseline hemoglobin 13 Na 136   K  4.0   creatinine 0.6   Chest x-ray showing diffuse bilateral interstitial airspace disease.   Following Medications were ordered in ER: Medications  magnesium sulfate IVPB 2 g 50 mL (0 g Intravenous Stopped 06/12/16 1622)  albuterol (PROVENTIL,VENTOLIN) solution continuous neb (10 mg/hr Nebulization Given 06/12/16 1614)  cefTRIAXone (ROCEPHIN) 1 g in dextrose 5 % 50 mL IVPB (0 g Intravenous Stopped 06/12/16 1838)  azithromycin (ZITHROMAX) 500 mg in dextrose 5 % 250 mL IVPB (0 mg Intravenous Stopped 06/12/16 1908)  albuterol (PROVENTIL) (2.5 MG/3ML) 0.083% nebulizer solution 5 mg (5 mg Nebulization Given 06/12/16 1930)      Hospitalist was called for admission for Asthma exacerbation  Review of Systems:    Pertinent positives include: wheezing. shortness of breath at rest.dyspnea on exertion,   Constitutional:  No weight loss, night sweats, Fevers, chills, fatigue, weight loss  HEENT:  No headaches, Difficulty swallowing,Tooth/dental problems,Sore throat,  No sneezing, itching, ear ache, nasal congestion, post nasal drip,  Cardio-vascular:  No chest pain, Orthopnea, PND, anasarca, dizziness, palpitations.no Bilateral lower extremity swelling  GI:  No heartburn, indigestion, abdominal pain, nausea, vomiting, diarrhea, change in bowel habits,  loss of appetite, melena, blood in stool, hematemesis Resp:   No excess mucus, no productive cough, No non-productive cough, No coughing up of blood.No change in color of mucus.No  Skin:  no rash or lesions. No jaundice GU:  no  dysuria, change in color of urine, no urgency or frequency. No straining to urinate.  No flank pain.  Musculoskeletal:  No joint pain or no joint swelling. No decreased range of motion. No back pain.  Psych:  No change in mood or affect. No depression or anxiety. No memory loss.  Neuro: no localizing neurological complaints, no tingling, no weakness, no double vision, no gait abnormality, no slurred speech, no confusion  As per HPI otherwise 10 point review of systems negative.   Past Medical History: Past Medical History:  Diagnosis Date  . Alcohol abuse   . Asthma   . Environmental allergies   . GERD (gastroesophageal reflux disease)   . Headache   . Hypertension   . Pancreatitis   . Prolonged QT syndrome   . Syncope and collapse   . Vision abnormalities    Past Surgical History:  Procedure Laterality Date  . ESOPHAGOGASTRODUODENOSCOPY N/A 07/17/2014   Procedure: ESOPHAGOGASTRODUODENOSCOPY (EGD);  Surgeon: Dorena Cookey, MD;  Location: [facility];  Service: Endoscopy;  Laterality: N/A;  . TYMPANOSTOMY TUBE PLACEMENT       Social History:  Ambulatory  independently    reports that she has been smoking Cigarettes.  She has a 1.00 pack-year smoking history. She has never used smokeless tobacco. She reports that she drinks about 1.8 oz of alcohol per week . She reports that she does not use drugs.  Allergies:   Allergies  Allergen Reactions  . Peanuts [Peanut Oil] Hives and Itching       Family History:   Family History  Problem Relation Age of Onset  . Heart failure Mother   . Heart failure Father   . Heart failure Maternal Grandfather     Medications: Prior to Admission medications   Medication Sig Start Date End Date Taking? Authorizing Provider  albuterol (PROVENTIL HFA;VENTOLIN HFA) 108 (90 Base) MCG/ACT inhaler Inhale 2 puffs into the lungs every 6 (six) hours as needed for wheezing or shortness of breath.   Yes Historical Provider, MD  albuterol  (PROVENTIL) (2.5 MG/3ML) 0.083% nebulizer solution Take 3 mLs by nebulization as needed. 06/11/16  Yes Historical Provider, MD  Cholecalciferol (VITAMIN D PO) Take 1 tablet by mouth at bedtime.   Yes Historical Provider, MD  gabapentin (NEURONTIN) 300 MG capsule Take 900 mg by mouth 2 (two) times daily.   Yes Historical Provider, MD  levofloxacin (LEVAQUIN) 500 MG tablet Take 500 mg by mouth daily. Started 2/23 x 10 days 06/11/16  Yes Historical Provider, MD  Melatonin 10 MG TABS Take 10 mg by mouth at bedtime.   Yes Historical Provider, MD  meloxicam (MOBIC) 15 MG tablet Take 15 mg by mouth daily.   Yes Historical Provider, MD  metoprolol succinate (TOPROL-XL) 25 MG 24 hr tablet Take 1 tablet (25 mg total) by mouth daily. *Please call and schedule a one year follow up appointment* 06/07/16  Yes Wendall Stade, MD  mometasone-formoterol [place]) 200-5 MCG/ACT AERO Inhale 2 puffs into the lungs 2 (two) times daily.   Yes Historical Provider, MD  montelukast (SINGULAIR) 10 MG tablet TAKE 1 TABLET BY MOUTH DAILY Patient taking differently: TAKE 1 TABLET BY MOUTH once daily at bedtime 12/08/15  Yes Jessica Priest, MD  Multiple  Vitamins-Minerals (MULTIVITAMIN WITH MINERALS) tablet Take 1 tablet by mouth daily.   Yes Historical Provider, MD  naltrexone (DEPADE) 50 MG tablet Take 50 mg by mouth daily.   Yes Historical Provider, MD  omeprazole (PRILOSEC) 40 MG capsule TAKE 1 CAPSULE(40 MG) BY MOUTH DAILY Patient taking differently: TAKE 1 CAPSULE(40 MG) BY MOUTH  twice DAILY 01/13/16  Yes Jessica Priest, MD  QUEtiapine (SEROQUEL) 50 MG tablet Take 100 mg by mouth at bedtime.    Yes Historical Provider, MD  sertraline (ZOLOFT) 50 MG tablet Take 75 mg by mouth daily.   Yes Historical Provider, MD  thiamine 100 MG tablet Take 100 mg by mouth daily.   Yes Historical Provider, MD  EPINEPHrine (EPIPEN 2-PAK) 0.3 mg/0.3 mL IJ SOAJ injection Inject 0.3 mg into the muscle once as needed (for severe allergic reaction).     Historical Provider, MD  metoprolol succinate (TOPROL-XL) 25 MG 24 hr tablet Take 1 tablet (25 mg total) by mouth daily. Patient not taking: Reported on 06/12/2016 06/09/15   Wendall Stade, MD  oxyCODONE-acetaminophen (PERCOCET) 5-325 MG tablet Take 1-2 tablets by mouth every 4 (four) hours as needed. Patient not taking: Reported on 06/12/2016 01/28/16   Rolan Bucco, MD    Physical Exam: Patient Vitals for the past 24 hrs:  BP Pulse Resp SpO2  06/12/16 2005 - 110 - 96 %  06/12/16 1931 - - - 94 %  06/12/16 1927 128/82 107 (!) 33 93 %  06/12/16 1817 119/76 112 26 91 %  06/12/16 1707 126/63 115 21 94 %  06/12/16 1619 126/75 112 20 97 %  06/12/16 1614 - - - 97 %  06/12/16 1547 121/79 (!) 127 17 97 %    1. General:  in No Acute distress 2. Psychological: Alert and  Oriented 3. Head/ENT:    Dry Mucous Membranes                          Head Non traumatic, neck supple                          Normal   Dentition 4. SKIN:  decreased Skin turgor,  Skin clean Dry and intact no rash 5. Heart: Regular rate and rhythm no Murmur, Rub or gallop 6. Lungs: some wheezes no crackles  or diminished air movement wearing BiPAP 7. Abdomen: Soft,  non-tender, Non distended 8. Lower extremities: no clubbing, cyanosis, or edema 9. Neurologically Grossly intact, moving all 4 extremities equally   10. MSK: Normal range of motion   body mass index is unknown because there is no height or weight on file.  Labs on Admission:   Labs on Admission: I have personally reviewed following labs and imaging studies  CBC:  Recent Labs Lab 06/12/16 1701  WBC 12.6*  HGB 13.0  HCT 38.9  MCV 84.4  PLT 262   Basic Metabolic Panel:  Recent Labs Lab 06/12/16 1701  NA 136  K 4.0  CL 103  CO2 26  GLUCOSE 196*  BUN 11  CREATININE 0.60  CALCIUM 8.8*   GFR: CrCl cannot be calculated (Unknown ideal weight.). Liver Function Tests: No results for input(s): AST, ALT, ALKPHOS, BILITOT, PROT, ALBUMIN in the  last 168 hours. No results for input(s): LIPASE, AMYLASE in the last 168 hours. No results for input(s): AMMONIA in the last 168 hours. Coagulation Profile: No results for input(s): INR, PROTIME in the  last 168 hours. Cardiac Enzymes: No results for input(s): CKTOTAL, CKMB, CKMBINDEX, TROPONINI in the last 168 hours. BNP (last 3 results) No results for input(s): PROBNP in the last 8760 hours. HbA1C: No results for input(s): HGBA1C in the last 72 hours. CBG: No results for input(s): GLUCAP in the last 168 hours. Lipid Profile: No results for input(s): CHOL, HDL, LDLCALC, TRIG, CHOLHDL, LDLDIRECT in the last 72 hours. Thyroid Function Tests: No results for input(s): TSH, T4TOTAL, FREET4, T3FREE, THYROIDAB in the last 72 hours. Anemia Panel: No results for input(s): VITAMINB12, FOLATE, FERRITIN, TIBC, IRON, RETICCTPCT in the last 72 hours.  Sepsis Labs: @LABRCNTIP (procalcitonin:4,lacticidven:4) )No results found for this or any previous visit (from the past 240 hour(s)).     UA  ordered  No results found for: HGBA1C  CrCl cannot be calculated (Unknown ideal weight.).  BNP (last 3 results) No results for input(s): PROBNP in the last 8760 hours.   ECG REPORT  Independently reviewed Rate: 126  Rhythm: Sinus tachycardia ST&T Change: No acute ischemic changes  QTC 466  There were no vitals filed for this visit.   Cultures:    Component Value Date/Time   SDES BLOOD LEFT ARM 02/16/2015 1620   SPECREQUEST  02/16/2015 1620    BOTTLES DRAWN AEROBIC AND ANAEROBIC 10CC BOTH BOTTLES   CULT  02/16/2015 1620    NO GROWTH 5 DAYS Performed at [facility]    REPTSTATUS 02/21/2015 FINAL 02/16/2015 1620     Radiological Exams on Admission: Dg Chest Portable 1 View  Result Date: 06/12/2016 CLINICAL DATA:  Shortness of breath, cough, and weakness for several days. Hypoxia. EXAM: PORTABLE CHEST 1 VIEW COMPARISON:  06/11/2016 FINDINGS: The heart size and mediastinal contours  are within normal limits. Diffuse interstitial infiltrates and patchy bibasilar predominant airspace disease shows no significant interval change. No evidence of pleural effusion or pneumothorax. IMPRESSION: Stable diffuse bilateral interstitial and airspace disease. Differential considerations include infection, edema, and hemorrhage. Electronically Signed   By: Myles Rosenthal M.D.   On: 06/12/2016 16:41    Chart has been reviewed    Assessment/Plan   31 y.o. female with medical history significant of alcohol abuse, asthma and allergic rhinitis and reflux and food allergy, alcohol pancreatitis, s/p splenic vein thrombosis, hx of Prolonged QTC, polyneuropathy admitted for severe asthma exacerbation and Possible community-acquired pneumonia  Present on Admission: . Asthma exacerbation -  -  - Will initiate: Steroid taper -   XopenexPRN, - scheduled duoneb,  -  Breo or Dulera at discharge   -  Mucinex.  Titrate O2 to saturation >90%. Follow patients respiratory status. - PCCM consulted for e-link monitoring,  -   BiPAP ordered PRN for increased work of breathing.  Currently mentating well no evidence of symptomatic hypercarbia   . Benign essential HTN stable continue to monitor continue home medications . Chronic diastolic heart failure (HCC) currently appears to be euvolemic . Chronic lower back pain stable continue to monitor, . Leukocytosis - in the setting of steroid use and possible CAP Tobacco abuse recommended quitting patient in agreement . Alcohol abuse, in remission, continue home medications has been free of alcohol abuse for the past 2 months continue observation CAP - bilateral multiple infiltrates appears to be atypical once able to tolerate would obtain CT with contrast to evaluate lump parenchyma. For now continue broad-spectrum antibiotics given recent history of alcohol abuse to cover possible aspiration Sepsis- meeting criteria for sepsis with elevated heart rate history  of fevers at home  and leukocytosis most likely source being pulmonary continue broad-spectrum antibiotics for now order MRSA and respiratory panel Other plan as per orders.  DVT prophylaxis:    Lovenox     Code Status:  FULL CODE  as per patient   Family Communication:   Family  at  Bedside  plan of care was discussed with  Husband,   Disposition Plan:     To home once workup is complete and patient is stable                              Consults called: Pulmonology will see in AM  Admission status:  inpatient      Level of care   SDU      I have spent a total of 65 min on this admission  extra time was spent to discuss case   Finlay Godbee 06/12/2016, 9:24 PM   [facility]  Pager [phone]   after 2 AM please page floor coverage PA If 7AM-7PM, please contact the day team taking care of the patient  [email or web address]  Password [email or web address]

## 2016-06-12 NOTE — ED Notes (Signed)
Bed: WA21 Expected date:  Expected time:  Means of arrival: Ambulance Comments: EMS respiratory distress 31 yo

## 2016-06-12 NOTE — ED Triage Notes (Signed)
Pt presented urgently in respiratory distress, reportedly diagnosed with PNA by PCP yesterday, high suspicion of an asthma attack with positive hx of asthma. EDP and RT paged to bedside.

## 2016-06-12 NOTE — Progress Notes (Signed)
Pharmacy Antibiotic Note  Kara [name] is a 31 y.o. female admitted on 06/12/2016 with pneumonia and sepsis.  Pharmacy has been consulted for vancomycin dosing.  Was started on Levaquin yesterday for pneumonia but symptoms worsened. Given one-time doses of azithromycin and ceftriaxone in the ED today.  Broadened therapy to vanc and zosyn and admitted to ICU.  Plan: Vancomycin 1500 mg x 1 then 1g IV q8h.   Check trough level at steady state. Daily SCr while on q8h vancomycin.     No data recorded.   Recent Labs Lab 06/12/16 1701  WBC 12.6*  CREATININE 0.60    CrCl cannot be calculated (Unknown ideal weight.).    Allergies  Allergen Reactions  . Peanuts [Peanut Oil] Hives and Itching    Antimicrobials this admission: 2/24 azithromycin x 1 2/24 ceftriaxone x 1 2/24 vancomycin >>  2/24 zosyn >>  Dose adjustments this admission:  Microbiology results: none  Thank you for allowing pharmacy to be a part of this patient's care.  Clance BollRunyon, Liara 06/12/2016 9:03 PM

## 2016-06-13 ENCOUNTER — Inpatient Hospital Stay (HOSPITAL_COMMUNITY): Payer: BC Managed Care – PPO

## 2016-06-13 DIAGNOSIS — I5032 Chronic diastolic (congestive) heart failure: Secondary | ICD-10-CM

## 2016-06-13 DIAGNOSIS — D72825 Bandemia: Secondary | ICD-10-CM

## 2016-06-13 DIAGNOSIS — J189 Pneumonia, unspecified organism: Secondary | ICD-10-CM

## 2016-06-13 DIAGNOSIS — R0602 Shortness of breath: Secondary | ICD-10-CM

## 2016-06-13 DIAGNOSIS — J4551 Severe persistent asthma with (acute) exacerbation: Secondary | ICD-10-CM

## 2016-06-13 DIAGNOSIS — I10 Essential (primary) hypertension: Secondary | ICD-10-CM

## 2016-06-13 HISTORY — DX: Pneumonia, unspecified organism: J18.9

## 2016-06-13 LAB — RESPIRATORY PANEL BY PCR
Adenovirus: NOT DETECTED
BORDETELLA PERTUSSIS-RVPCR: NOT DETECTED
CHLAMYDOPHILA PNEUMONIAE-RVPPCR: NOT DETECTED
CORONAVIRUS 229E-RVPPCR: NOT DETECTED
CORONAVIRUS HKU1-RVPPCR: NOT DETECTED
Coronavirus NL63: NOT DETECTED
Coronavirus OC43: NOT DETECTED
Influenza A: NOT DETECTED
Influenza B: NOT DETECTED
METAPNEUMOVIRUS-RVPPCR: NOT DETECTED
Mycoplasma pneumoniae: NOT DETECTED
PARAINFLUENZA VIRUS 2-RVPPCR: NOT DETECTED
Parainfluenza Virus 1: NOT DETECTED
Parainfluenza Virus 3: NOT DETECTED
Parainfluenza Virus 4: NOT DETECTED
Respiratory Syncytial Virus: NOT DETECTED
Rhinovirus / Enterovirus: NOT DETECTED

## 2016-06-13 LAB — COMPREHENSIVE METABOLIC PANEL
ALK PHOS: 71 U/L (ref 38–126)
ALT: 14 U/L (ref 14–54)
AST: 17 U/L (ref 15–41)
Albumin: 3 g/dL — ABNORMAL LOW (ref 3.5–5.0)
Anion gap: 8 (ref 5–15)
BILIRUBIN TOTAL: 0.3 mg/dL (ref 0.3–1.2)
BUN: 8 mg/dL (ref 6–20)
CHLORIDE: 104 mmol/L (ref 101–111)
CO2: 25 mmol/L (ref 22–32)
CREATININE: 0.58 mg/dL (ref 0.44–1.00)
Calcium: 8.4 mg/dL — ABNORMAL LOW (ref 8.9–10.3)
GFR calc Af Amer: >60 mL/min (ref >60)
Glucose, Bld: 252 mg/dL — ABNORMAL HIGH (ref 65–99)
Potassium: 4.3 mmol/L (ref 3.5–5.1)
Sodium: 137 mmol/L (ref 135–145)
Total Protein: 6.9 g/dL (ref 6.5–8.1)

## 2016-06-13 LAB — MAGNESIUM: Magnesium: 2 mg/dL (ref 1.7–2.4)

## 2016-06-13 LAB — CBC
HCT: 36.9 % (ref 36.0–46.0)
Hemoglobin: 12.2 g/dL (ref 12.0–15.0)
MCH: 27.9 pg (ref 26.0–34.0)
MCHC: 33.1 g/dL (ref 30.0–36.0)
MCV: 84.4 fL (ref 78.0–100.0)
Platelets: 242 10*3/uL (ref 150–400)
RBC: 4.37 MIL/uL (ref 3.87–5.11)
RDW: 13.5 % (ref 11.5–15.5)
WBC: 10.6 10*3/uL — AB (ref 4.0–10.5)

## 2016-06-13 LAB — ECHOCARDIOGRAM COMPLETE
HEIGHTINCHES: 65 in
WEIGHTICAEL: 3545 [oz_av]

## 2016-06-13 LAB — GLUCOSE, CAPILLARY
GLUCOSE-CAPILLARY: 145 mg/dL — AB (ref 65–99)
Glucose-Capillary: 152 mg/dL — ABNORMAL HIGH (ref 65–99)
Glucose-Capillary: 163 mg/dL — ABNORMAL HIGH (ref 65–99)
Glucose-Capillary: 243 mg/dL — ABNORMAL HIGH (ref 65–99)

## 2016-06-13 LAB — TROPONIN I

## 2016-06-13 LAB — PHOSPHORUS: PHOSPHORUS: 2.4 mg/dL — AB (ref 2.5–4.6)

## 2016-06-13 LAB — STREP PNEUMONIAE URINARY ANTIGEN: Strep Pneumo Urinary Antigen: NEGATIVE

## 2016-06-13 LAB — LACTIC ACID, PLASMA
LACTIC ACID, VENOUS: 0.8 mmol/L (ref 0.5–1.9)
Lactic Acid, Venous: 3.7 mmol/L (ref 0.5–1.9)

## 2016-06-13 LAB — TSH: TSH: 0.219 u[IU]/mL — AB (ref 0.350–4.500)

## 2016-06-13 MED ORDER — INSULIN ASPART 100 UNIT/ML ~~LOC~~ SOLN
0.0000 [IU] | Freq: Three times a day (TID) | SUBCUTANEOUS | Status: DC
  Administered 2016-06-13: 2 [IU] via SUBCUTANEOUS
  Administered 2016-06-13: 1 [IU] via SUBCUTANEOUS
  Administered 2016-06-14 (×2): 2 [IU] via SUBCUTANEOUS

## 2016-06-13 MED ORDER — IOPAMIDOL (ISOVUE-370) INJECTION 76%
100.0000 mL | Freq: Once | INTRAVENOUS | Status: AC | PRN
  Administered 2016-06-13: 100 mL via INTRAVENOUS

## 2016-06-13 MED ORDER — METHYLPREDNISOLONE SODIUM SUCC 125 MG IJ SOLR
60.0000 mg | Freq: Four times a day (QID) | INTRAMUSCULAR | Status: AC
  Administered 2016-06-13 – 2016-06-14 (×6): 60 mg via INTRAVENOUS
  Filled 2016-06-13 (×6): qty 2

## 2016-06-13 MED ORDER — PREDNISONE 20 MG PO TABS: 40.0000 mg | ORAL_TABLET | Freq: Every day | ORAL | Status: DC

## 2016-06-13 MED ORDER — SODIUM CHLORIDE 0.9 % IV BOLUS (SEPSIS)
1000.0000 mL | Freq: Once | INTRAVENOUS | Status: AC
  Administered 2016-06-13: 1000 mL via INTRAVENOUS

## 2016-06-13 MED ORDER — IOPAMIDOL (ISOVUE-370) INJECTION 76%
INTRAVENOUS | Status: AC
  Filled 2016-06-13: qty 100

## 2016-06-13 NOTE — Progress Notes (Signed)
CRITICAL VALUE ALERT  Critical value received:  Lactic acid 3.7  Date of notification: 06/13/16  Time of notification: 0130  Critical value read back:Yes.    Nurse who received alert:  Max SaneMackenzie Aiyah Scarpelli, RN  MD notified (1st page): [facility] on call   Time of first page: [phone]

## 2016-06-13 NOTE — Progress Notes (Signed)
[facility]  PROGRESS NOTE  Kara [name] UUV:[id number] DOB: 12-Mar-1986 DOA: 06/12/2016 PCP: Duane Lope, MD   Brief HPI:    31 y.o. female with medical history significant of alcohol abuse, asthma and allergic rhinitis and reflux and food allergy, alcohol pancreatitis, s/p splenic vein thrombosis, hx of Prolonged QTC, polyneuropathy    Presented with severe shortness of breath. Patient have been having cough and intermittent fevers up to 102 at home for past 2 weeks she presented to her primary care provider seems like a week ago was treated with Z-Pak and steroids and inhalers but did not seem to help patient went again yesterday chest x-ray was done and she was started on Levaquin and steroids. 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 She normally does not get severe asthma exacerbations this is the worse asthma exacerbation she have ever had. Reports unable to lay down flat. Gets significant shortness of breath with minimal exertion. Denies any hemoptysis. Cough productive of yellow sputum. Today she developed severe shortness of breath and wheezing called EMS on their arrival patient was in respiratory distress diaphoretic having trouble moving any air patient was hypoxic down to 85% room air   Subjective   Patient seen and examined, admitted with asthma exacerbation. Breathing has improved.   Assessment/Plan:     1. Acute hypoxic respiratory failure- secondary to asthma exacerbation, improved. Currently not requiring BiPAP.O2 sats 98% on 2 L oxygen via nasal cannula. 2. Asthma exacerbation- improved, started on Solu-Medrol. Change the dose of Solu-Medrol to 60 mg IV every 6 hours, continue duo nebs every 4 hours, oxygen via nasal cannula. 3. ? Community-acquired pneumonia- CTA chest showed moderate diffuse ground glass opacities, Likely from infection versus edema. Patient has been empirically  started on Vancomycin, Zithromax and Zosyn. Will continue with antibiotics for now. Lactic acidosis has resolved with IV fluids.Her lactate this morning is 0.8. Follow blood culture results 4. History of grade 2 diastolic dysfunction- as per echo From February 2016, patient has grade 2 diastolic dysfunction, BNP was 47.4. Stable, not in acute exacerbation at this time. 5. Hypertension- blood pressure is stable, Metoprolol is currently on hold due to asthma exacerbation. 6. Sinus tachycardia- likely combination of rebound tachycardia from holding metoprololas well as albuterol. Will monitor, and start metoprolol if patient continues to have persistent sinus tachycardia. 7. Alcohol abuse- currently in remission, no signs and symptoms of alcohol withdrawal. Will continue to monitor. 8. Hyperglycemia- Likely from steroids, We'll start sliding scale insulin with NovoLog.    DVT prophylaxis: Lovenox  Code Status: Full code  Family Communication: No family present at bedside   Disposition Plan: Pending improvement in breathing   Consultants:  None  Procedures:  None    Antibiotics:   Anti-infectives    Start     Dose/Rate Route Frequency Ordered Stop   06/13/16 1800  azithromycin (ZITHROMAX) 500 mg in dextrose 5 % 250 mL IVPB     500 mg 250 mL/hr over 60 Minutes Intravenous Every 24 hours 06/12/16 2121     06/13/16 0600  vancomycin (VANCOCIN) IVPB 1000 mg/200 mL premix     1,000 mg 200 mL/hr over 60 Minutes Intravenous Every 8 hours 06/12/16 2113     06/12/16 2200  piperacillin-tazobactam (ZOSYN) IVPB 3.375 g     3.375 g 12.5 mL/hr over 240 Minutes Intravenous Every 8 hours 06/12/16 2115     06/12/16 2115  vancomycin (VANCOCIN) 1,500 mg in sodium chloride 0.9 % 500 mL IVPB     1,500 mg 250 mL/hr over 120 Minutes Intravenous  Once 06/12/16 2112 06/12/16 2328   06/12/16 1745  cefTRIAXone (ROCEPHIN) 1 g in dextrose 5 % 50 mL IVPB     1 g 100 mL/hr over 30 Minutes Intravenous  Once  06/12/16 1742 06/12/16 1838   06/12/16 1745  azithromycin (ZITHROMAX) 500 mg in dextrose 5 % 250 mL IVPB     500 mg 250 mL/hr over 60 Minutes Intravenous  Once 06/12/16 1742 06/12/16 1908       Objective   Vitals:   06/13/16 0500 06/13/16 0600 06/13/16 0701 06/13/16 0822  BP:  (!) 99/57    Pulse: 81 95    Resp: (!) 23 (!) 21    Temp:   97.5 F (36.4 C)   TempSrc:   Oral   SpO2: 96% 97%  98%  Weight:      Height:        Intake/Output Summary (Last 24 hours) at 06/13/16 0900 Last data filed at 06/13/16 0100  Gross per 24 hour  Intake              350 ml  Output              400 ml  Net              -50 ml   Filed Weights   06/12/16 2200  Weight: 100.5 kg (221 lb 9 oz)     Physical Examination:  General exam: Appears calm and comfortable. Respiratory system: Bilateral wheezing auscultated. Respiratory effort normal. Cardiovascular system:  RRR. No  murmurs, rubs, gallops. No pedal edema. GI system: Abdomen is nondistended, soft and nontender. No organomegaly.  Central nervous system. No focal neurological deficits. 5 x 5 power in all extremities. Skin: No rashes, lesions or ulcers. Psychiatry: Alert, oriented x 3.Judgement and insight appear normal. Affect normal.    Data Reviewed: I have personally reviewed following labs and imaging studies  CBG: No results for input(s): GLUCAP in the last 168 hours.  CBC:  Recent Labs Lab 06/12/16 1701 06/12/16 2136 06/13/16 0520  WBC 12.6* 10.5 10.6*  NEUTROABS  --  9.9*  --   HGB 13.0 12.9 12.2  HCT 38.9 38.6 36.9  MCV 84.4 84.3 84.4  PLT 262 269 242    Basic Metabolic Panel:  Recent Labs Lab 06/12/16 1701 06/13/16 0520  NA 136 137  K 4.0 4.3  CL 103 104  CO2 26 25  GLUCOSE 196* 252*  BUN 11 8  CREATININE 0.60 0.58  CALCIUM 8.8* 8.4*  MG  --  2.0  PHOS  --  2.4*    Recent Results (from the past 240 hour(s))  MRSA PCR Screening     Status: None   Collection Time: 06/12/16  9:45 PM  Result Value  Ref Range Status   MRSA by PCR NEGATIVE NEGATIVE Final    Comment:        The GeneXpert MRSA Assay (FDA approved for NASAL specimens only), is one component of a comprehensive MRSA colonization surveillance program. It is not intended to diagnose MRSA infection nor to guide or monitor treatment for MRSA infections.      Liver Function Tests:  Recent Labs Lab 06/13/16 0520  AST 17  ALT 14  ALKPHOS 71  BILITOT 0.3  PROT 6.9  ALBUMIN 3.0*   No results for input(s): LIPASE, AMYLASE in the  last 168 hours. No results for input(s): AMMONIA in the last 168 hours.  Cardiac Enzymes:  Recent Labs Lab 06/12/16 2136 06/13/16 0516  TROPONINI <0.03 <0.03   BNP (last 3 results)  Recent Labs  06/12/16 2136  BNP 21.7    ProBNP (last 3 results) No results for input(s): PROBNP in the last 8760 hours.    Studies: Ct Angio Chest Pe W Or Wo Contrast  Result Date: 06/13/2016 CLINICAL DATA:  Dyspnea EXAM: CT ANGIOGRAPHY CHEST WITH CONTRAST TECHNIQUE: Multidetector CT imaging of the chest was performed using the standard protocol during bolus administration of intravenous contrast. Multiplanar CT image reconstructions and MIPs were obtained to evaluate the vascular anatomy. CONTRAST:  100 mL Isovue 370 intravenous COMPARISON:  Chest x-ray 06/12/2016 FINDINGS: Cardiovascular: Satisfactory opacification of the pulmonary arteries to the segmental level. No evidence of pulmonary embolism. Non aneurysmal aorta. No dissection. Normal heart size. No pericardial effusion. Mediastinum/Nodes: Mediastinal adenopathy is present. Precarinal lymph node measures 12 mm. Lymph nodes adjacent to the aortic arch measures 6 mm short axis. Trachea midline. Thyroid normal. Esophagus within normal limits. Small hilar nodes. Lungs/Pleura: No pleural effusion or pneumothorax. Moderate bilateral ground-glass densities throughout all lobes of the lung. Upper Abdomen: Spleen slightly enlarged at 14 cm. No acute  abnormality Musculoskeletal: No chest wall abnormality. No acute or significant osseous findings. Review of the MIP images confirms the above findings. IMPRESSION: 1. Negative for acute pulmonary embolus or aortic dissection 2. Moderate diffuse bilateral ground-glass densities which may be secondary to diffuse pneumonia, pulmonary hemorrhage, or edema. 3. Mild mediastinal adenopathy. Electronically Signed   By: Jasmine PangKim  Fujinaga M.D.   On: 06/13/2016 04:00   Dg Chest Portable 1 View  Result Date: 06/12/2016 CLINICAL DATA:  Shortness of breath, cough, and weakness for several days. Hypoxia. EXAM: PORTABLE CHEST 1 VIEW COMPARISON:  06/11/2016 FINDINGS: The heart size and mediastinal contours are within normal limits. Diffuse interstitial infiltrates and patchy bibasilar predominant airspace disease shows no significant interval change. No evidence of pleural effusion or pneumothorax. IMPRESSION: Stable diffuse bilateral interstitial and airspace disease. Differential considerations include infection, edema, and hemorrhage. Electronically Signed   By: Myles RosenthalJohn  Stahl M.D.   On: 06/12/2016 16:41    Scheduled Meds: . azithromycin (ZITHROMAX) 500 MG IVPB  500 mg Intravenous Q24H  . enoxaparin (LOVENOX) injection  40 mg Subcutaneous QHS  . gabapentin  900 mg Oral BID  . guaiFENesin  600 mg Oral BID  . iopamidol      . ipratropium-albuterol  3 mL Nebulization Q4H  . methylPREDNISolone (SOLU-MEDROL) injection  60 mg Intravenous Q6H   Followed by  . [START ON 06/15/2016] predniSONE  40 mg Oral Q supper  . montelukast  10 mg Oral QHS  . naltrexone  50 mg Oral Daily  . nicotine  14 mg Transdermal Daily  . pantoprazole  40 mg Oral Daily  . piperacillin-tazobactam (ZOSYN)  IV  3.375 g Intravenous Q8H  . QUEtiapine  100 mg Oral QHS  . sertraline  75 mg Oral Daily  . sodium chloride flush  3 mL Intravenous Q12H  . sodium chloride flush  3 mL Intravenous Q12H  . vancomycin  1,000 mg Intravenous Q8H      Time  spent: 25 min  [facility],Kruze Atchley S   [facility] Pager [phone]. If 7PM-7AM, please contact night-coverage at [email or web address], Office  [phone]  password [email or web address] 06/13/2016, 9:00 AM  LOS: 1 day

## 2016-06-13 NOTE — Progress Notes (Signed)
Patient ambulated to bathroom with standby assist on room air. While up heart rate increased to 140's and oxygen level decreased to 86%.  Patient c/o shortness of breath, assisted patient back to bed and applied oxygen at 2 liters via nasal cannula oxygen sat 96%.

## 2016-06-13 NOTE — Progress Notes (Signed)
eLink Physician-Brief Progress Note Patient Name: Kara [name] Reason DOB: 12/14/1985 MRN: [id number]   Date of Service  06/13/2016  HPI/Events of Note  Patient with lactate that had been normal at 1.8 but is now 3.7.  Patient admitted with Asthma exacerbation and stated concern for sepsis in admission H&P but was not treated per sepsis protocol with IVFs.  BP has been stable at 120s to 130s systolic but tachy, febrile at home and leukocytosis.  On ABX and BiPAP.  Sats of 100% with RR in the 20s.  On camera check patient with no increased WOB on BiPAP with TVs of 400 to 500.  ABG is acceptable.  eICU Interventions  Plan: 1 liter of NS over 3 hours Recheck lactate     Intervention Category Major Interventions: Sepsis - evaluation and management  Abisola Carrero 06/13/2016, 1:53 AM

## 2016-06-13 NOTE — Progress Notes (Signed)
  Echocardiogram 2D Echocardiogram has been performed.  Solita Macadam L Androw 06/13/2016, 11:56 AM

## 2016-06-14 DIAGNOSIS — G8929 Other chronic pain: Secondary | ICD-10-CM

## 2016-06-14 DIAGNOSIS — J189 Pneumonia, unspecified organism: Secondary | ICD-10-CM

## 2016-06-14 DIAGNOSIS — M545 Low back pain: Secondary | ICD-10-CM

## 2016-06-14 LAB — GLUCOSE, CAPILLARY
Glucose-Capillary: 161 mg/dL — ABNORMAL HIGH (ref 65–99)
Glucose-Capillary: 186 mg/dL — ABNORMAL HIGH (ref 65–99)

## 2016-06-14 LAB — COMPREHENSIVE METABOLIC PANEL
ALBUMIN: 3.1 g/dL — AB (ref 3.5–5.0)
ALT: 15 U/L (ref 14–54)
ANION GAP: 6 (ref 5–15)
AST: 14 U/L — ABNORMAL LOW (ref 15–41)
Alkaline Phosphatase: 69 U/L (ref 38–126)
BUN: 15 mg/dL (ref 6–20)
CO2: 26 mmol/L (ref 22–32)
Calcium: 8.9 mg/dL (ref 8.9–10.3)
Chloride: 106 mmol/L (ref 101–111)
Creatinine, Ser: 0.58 mg/dL (ref 0.44–1.00)
GFR calc Af Amer: >60 mL/min (ref >60)
GFR calc non Af Amer: >60 mL/min (ref >60)
GLUCOSE: 175 mg/dL — AB (ref 65–99)
POTASSIUM: 4.4 mmol/L (ref 3.5–5.1)
SODIUM: 138 mmol/L (ref 135–145)
TOTAL PROTEIN: 6.4 g/dL — AB (ref 6.5–8.1)
Total Bilirubin: <0.1 mg/dL — ABNORMAL LOW (ref 0.3–1.2)

## 2016-06-14 LAB — CBC
HCT: 37.3 % (ref 36.0–46.0)
Hemoglobin: 12 g/dL (ref 12.0–15.0)
MCH: 27.6 pg (ref 26.0–34.0)
MCHC: 32.2 g/dL (ref 30.0–36.0)
MCV: 85.9 fL (ref 78.0–100.0)
Platelets: 311 10*3/uL (ref 150–400)
RBC: 4.34 MIL/uL (ref 3.87–5.11)
RDW: 13.6 % (ref 11.5–15.5)
WBC: 20.7 10*3/uL — ABNORMAL HIGH (ref 4.0–10.5)

## 2016-06-14 LAB — HEMOGLOBIN A1C
Hgb A1c MFr Bld: 5.6 % (ref 4.8–5.6)
Mean Plasma Glucose: 114 mg/dL

## 2016-06-14 LAB — HIV ANTIBODY (ROUTINE TESTING W REFLEX): HIV SCREEN 4TH GENERATION: NONREACTIVE

## 2016-06-14 MED ORDER — LEVALBUTEROL HCL 1.25 MG/0.5ML IN NEBU
1.2500 mg | INHALATION_SOLUTION | Freq: Four times a day (QID) | RESPIRATORY_TRACT | Status: DC
  Administered 2016-06-14: 1.25 mg via RESPIRATORY_TRACT
  Filled 2016-06-14: qty 0.5

## 2016-06-14 MED ORDER — PREDNISONE 10 MG PO TABS: ORAL_TABLET | ORAL | 0 refills | Status: DC

## 2016-06-14 MED ORDER — IPRATROPIUM BROMIDE 0.02 % IN SOLN: 0.5000 mg | RESPIRATORY_TRACT | Status: DC | PRN

## 2016-06-14 MED ORDER — IPRATROPIUM BROMIDE 0.02 % IN SOLN
0.5000 mg | Freq: Four times a day (QID) | RESPIRATORY_TRACT | Status: DC
  Administered 2016-06-14: 0.5 mg via RESPIRATORY_TRACT
  Filled 2016-06-14: qty 2.5

## 2016-06-14 MED ORDER — LEVOFLOXACIN 750 MG PO TABS: 750.0000 mg | ORAL_TABLET | Freq: Every day | ORAL | 0 refills | Status: DC

## 2016-06-14 MED ORDER — LEVALBUTEROL HCL 1.25 MG/0.5ML IN NEBU: 1.2500 mg | INHALATION_SOLUTION | Freq: Four times a day (QID) | RESPIRATORY_TRACT | Status: DC | PRN

## 2016-06-14 NOTE — Progress Notes (Signed)
Date: June 14, 2016 Discharge orders checked for needs. No case management needs present at time of discharge. Rhonda Davis, RN, BSN, CCM   [phone] 

## 2016-06-14 NOTE — Discharge Summary (Addendum)
Physician Discharge Summary  Kara [name] ZOX:[id number] DOB: [birth year] DOA: 06/12/2016  PCP: Duane Lope, MD  Admit date: 06/12/2016 Discharge date: 06/14/2016  Time spent: 25* minutes  Recommendations for Outpatient Follow-up:  1. *Follow up PCP in 2 weeks   Discharge Diagnoses:  Active Problems:   Chronic diastolic heart failure (HCC)   Chronic lower back pain   Leukocytosis   Benign essential HTN   Asthma exacerbation   Alcohol abuse, in remission   Sepsis (HCC)   CAP (community acquired pneumonia)   Discharge Condition: Stable  Diet recommendation: Low salt diet  Filed Weights   06/12/16 2200  Weight: 100.5 kg (221 lb 9 oz)    History of present illness:  31 y.o.femalewith medical history significant of alcohol abuse, asthma and allergic rhinitis and reflux and food allergy, alcohol pancreatitis, s/p splenic vein thrombosis, hx of Prolonged QTC, polyneuropathy   Presented with severe shortness of breath.Patient have been having cough and intermittent fevers up to 102 at home for past 2 weeks she presented to her primary care provider seems like a week ago was treated with Z-Pak and steroids and inhalers but did not seem to help patient went again yesterday chest x-ray was done and she was started onLevaquin and steroids. 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 She normally does not get severe asthma exacerbations this is the worse asthma exacerbation she have ever had. Reports unable to lay down flat. Gets significant shortness of breath with minimal exertion. Denies any hemoptysis. Cough productive of yellow sputum. Today she developed severe shortness of breath and wheezing called EMSon their arrival patient was in respiratory distress diaphoretic having trouble moving any air patient was hypoxic down to 85% room air   Hospital Course:  1. Acute hypoxic respiratory failure-  secondary to asthma exacerbation, resolved. Patient ambulated without difficulty on RA. Influenza PCR is negative, Respiratory panel is negative. 2. Asthma exacerbation- improved, started on Solu-Medrol 60 mg IV every 6 hours, continuous  duo nebs every 4 hours, oxygen via nasal cannula. Will discharge home on Prednisone taper. 3.  Community-acquired pneumonia- CTA chest showed moderate diffuse ground glass opacities, Likely from infection versus edema. Patient has been empirically started on Vancomycin, Zithromax and Zosyn.. Lactic acidosis has resolved with IV fluids.Her lactate was 0.8 yesterday, blood culture is negative to date, urinary strep pneumo antigen is negative will discharge on Levaquin 750 mg po daily x 7 days. 4. History of grade 2 diastolic dysfunction- as per echo From February 2016, patient has grade 2 diastolic dysfunction, BNP was 81.1. Stable, not in acute exacerbation at this time. 5. Hypertension- blood pressure is stable, continue Metoprolol. 6. Sinus tachycardia- resolved. 7. Alcohol abuse- currently in remission, no signs and symptoms of alcohol withdrawal. Will continue to monitor. 8. Hyperglycemia- Likely from steroids, Was started on sliding scale insulin with NovoLog. 9. Leukocytosis- this morning wbc went up to 20,000. She has been afebrile, breathing much better. WBC elevation is likley due to high dose Solumedrol. 10. H/o prolonged Qtc interval- ekg  On 06/14/16 sowed Qtc 459 ms.  Procedures:  None   Consultations:  None   Discharge Exam: Vitals:   06/14/16 0900 06/14/16 1000  BP:  138/84  Pulse: (!) 106 97  Resp: 19 (!) 22  Temp:      General: Appears in no acute distress Cardiovascular: RRR, S1S2 Respiratory: Clear bilaterally  Discharge Instructions  Discharge Instructions    Diet - low sodium heart healthy    Complete by:  As directed    Increase activity slowly    Complete by:  As directed      Current Discharge Medication List     START taking these medications   Details  predniSONE (DELTASONE) 10 MG tablet Prednisone 40 mg po daily x 1 day then Prednisone 30 mg po daily x 1 day then Prednisone 20 mg po daily x 1 day then Prednisone 10 mg daily x 1 day then stop... Qty: 10 tablet, Refills: 0      CONTINUE these medications which have CHANGED   Details  levofloxacin (LEVAQUIN) 750 MG tablet Take 1 tablet (750 mg total) by mouth daily. Qty: 7 tablet, Refills: 0      CONTINUE these medications which have NOT CHANGED   Details  albuterol (PROVENTIL HFA;VENTOLIN HFA) 108 (90 Base) MCG/ACT inhaler Inhale 2 puffs into the lungs every 6 (six) hours as needed for wheezing or shortness of breath.    albuterol (PROVENTIL) (2.5 MG/3ML) 0.083% nebulizer solution Take 3 mLs by nebulization as needed.    Cholecalciferol (VITAMIN D PO) Take 1 tablet by mouth at bedtime.    gabapentin (NEURONTIN) 300 MG capsule Take 900 mg by mouth 2 (two) times daily.    Melatonin 10 MG TABS Take 10 mg by mouth at bedtime.    meloxicam (MOBIC) 15 MG tablet Take 15 mg by mouth daily.    metoprolol succinate (TOPROL-XL) 25 MG 24 hr tablet Take 1 tablet (25 mg total) by mouth daily. *Please call and schedule a one year follow up appointment* Qty: 90 tablet, Refills: 0    mometasone-formoterol (DULERA) 200-5 MCG/ACT AERO Inhale 2 puffs into the lungs 2 (two) times daily.    montelukast (SINGULAIR) 10 MG tablet TAKE 1 TABLET BY MOUTH DAILY Qty: 30 tablet, Refills: 4    Multiple Vitamins-Minerals (MULTIVITAMIN WITH MINERALS) tablet Take 1 tablet by mouth daily.    naltrexone (DEPADE) 50 MG tablet Take 50 mg by mouth daily.    omeprazole (PRILOSEC) 40 MG capsule TAKE 1 CAPSULE(40 MG) BY MOUTH DAILY Qty: 30 capsule, Refills: 5    QUEtiapine (SEROQUEL) 50 MG tablet Take 100 mg by mouth at bedtime.     sertraline (ZOLOFT) 50 MG tablet Take 75 mg by mouth daily.    thiamine 100 MG tablet Take 100 mg by mouth daily.    EPINEPHrine  (EPIPEN 2-PAK) 0.3 mg/0.3 mL IJ SOAJ injection Inject 0.3 mg into the muscle once as needed (for severe allergic reaction).    oxyCODONE-acetaminophen (PERCOCET) 5-325 MG tablet Take 1-2 tablets by mouth every 4 (four) hours as needed. Qty: 20 tablet, Refills: 0       Allergies  Allergen Reactions  . Peanuts [Peanut Oil] Hives and Itching   Follow-up Information    Duane LopeAlan Ross, MD.   Specialty:  Family Medicine Contact information: [address] [place] [address] [phone]            The results of significant diagnostics from this hospitalization (including imaging, microbiology, ancillary and laboratory) are listed below for reference.    Significant Diagnostic Studies: Ct Angio Chest Pe W Or Wo Contrast  Result Date: 06/13/2016 CLINICAL DATA:  Dyspnea EXAM: CT ANGIOGRAPHY CHEST WITH CONTRAST TECHNIQUE: Multidetector CT imaging of the chest was performed using the standard protocol during bolus administration of intravenous contrast. Multiplanar CT image reconstructions and MIPs were obtained to evaluate the vascular  anatomy. CONTRAST:  100 mL Isovue 370 intravenous COMPARISON:  Chest x-ray 06/12/2016 FINDINGS: Cardiovascular: Satisfactory opacification of the pulmonary arteries to the segmental level. No evidence of pulmonary embolism. Non aneurysmal aorta. No dissection. Normal heart size. No pericardial effusion. Mediastinum/Nodes: Mediastinal adenopathy is present. Precarinal lymph node measures 12 mm. Lymph nodes adjacent to the aortic arch measures 6 mm short axis. Trachea midline. Thyroid normal. Esophagus within normal limits. Small hilar nodes. Lungs/Pleura: No pleural effusion or pneumothorax. Moderate bilateral ground-glass densities throughout all lobes of the lung. Upper Abdomen: Spleen slightly enlarged at 14 cm. No acute abnormality Musculoskeletal: No chest wall abnormality. No acute or significant osseous findings. Review of the MIP images confirms the  above findings. IMPRESSION: 1. Negative for acute pulmonary embolus or aortic dissection 2. Moderate diffuse bilateral ground-glass densities which may be secondary to diffuse pneumonia, pulmonary hemorrhage, or edema. 3. Mild mediastinal adenopathy. Electronically Signed   By: Jasmine Pang M.D.   On: 06/13/2016 04:00   Dg Chest Portable 1 View  Result Date: 06/12/2016 CLINICAL DATA:  Shortness of breath, cough, and weakness for several days. Hypoxia. EXAM: PORTABLE CHEST 1 VIEW COMPARISON:  06/11/2016 FINDINGS: The heart size and mediastinal contours are within normal limits. Diffuse interstitial infiltrates and patchy bibasilar predominant airspace disease shows no significant interval change. No evidence of pleural effusion or pneumothorax. IMPRESSION: Stable diffuse bilateral interstitial and airspace disease. Differential considerations include infection, edema, and hemorrhage. Electronically Signed   By: Myles Rosenthal M.D.   On: 06/12/2016 16:41    Microbiology: Recent Results (from the past 240 hour(s))  Culture, blood (routine x 2) Call MD if unable to obtain prior to antibiotics being given     Status: None (Preliminary result)   Collection Time: 06/12/16  9:37 PM  Result Value Ref Range Status   Specimen Description BLOOD RIGHT ANTECUBITAL  Final   Special Requests IN PEDIATRIC BOTTLE  Final   Culture   Final    NO GROWTH < 24 HOURS Performed at [facility], [address], [place], [place] [address]    Report Status PENDING  Incomplete  Respiratory Panel by PCR     Status: None   Collection Time: 06/12/16  9:45 PM  Result Value Ref Range Status   Adenovirus NOT DETECTED NOT DETECTED Final   Coronavirus 229E NOT DETECTED NOT DETECTED Final   Coronavirus HKU1 NOT DETECTED NOT DETECTED Final   Coronavirus NL63 NOT DETECTED NOT DETECTED Final   Coronavirus OC43 NOT DETECTED NOT DETECTED Final   Metapneumovirus NOT DETECTED NOT DETECTED Final   Rhinovirus / Enterovirus NOT  DETECTED NOT DETECTED Final   Influenza A NOT DETECTED NOT DETECTED Final   Influenza B NOT DETECTED NOT DETECTED Final   Parainfluenza Virus 1 NOT DETECTED NOT DETECTED Final   Parainfluenza Virus 2 NOT DETECTED NOT DETECTED Final   Parainfluenza Virus 3 NOT DETECTED NOT DETECTED Final   Parainfluenza Virus 4 NOT DETECTED NOT DETECTED Final   Respiratory Syncytial Virus NOT DETECTED NOT DETECTED Final   Bordetella pertussis NOT DETECTED NOT DETECTED Final   Chlamydophila pneumoniae NOT DETECTED NOT DETECTED Final   Mycoplasma pneumoniae NOT DETECTED NOT DETECTED Final    Comment: Performed at [facility], [address] [place], [place] [address]  MRSA PCR Screening     Status: None   Collection Time: 06/12/16  9:45 PM  Result Value Ref Range Status   MRSA by PCR NEGATIVE NEGATIVE Final  Comment:        The GeneXpert MRSA Assay (FDA approved for NASAL specimens only), is one component of a comprehensive MRSA colonization surveillance program. It is not intended to diagnose MRSA infection nor to guide or monitor treatment for MRSA infections.   Culture, blood (routine x 2) Call MD if unable to obtain prior to antibiotics being given     Status: None (Preliminary result)   Collection Time: 06/12/16  9:50 PM  Result Value Ref Range Status   Specimen Description BLOOD BLOOD RIGHT FOREARM  Final   Special Requests BOTTLES DRAWN AEROBIC ONLY  Final   Culture   Final    NO GROWTH < 24 HOURS Performed at [facility], [address] [place], [place] [address]    Report Status PENDING  Incomplete     Labs: Basic Metabolic Panel:  Recent Labs Lab 06/12/16 1701 06/13/16 0520 06/14/16 0402  NA 136 137 138  K 4.0 4.3 4.4  CL 103 104 106  CO2 26 25 26   GLUCOSE 196* 252* 175*  BUN 11 8 15   CREATININE 0.60 0.58 0.58  CALCIUM 8.8* 8.4* 8.9  MG  --  2.0  --   PHOS  --  2.4*  --    Liver Function Tests:  Recent Labs Lab 06/13/16 0520 06/14/16 0402   AST 17 14*  ALT 14 15  ALKPHOS 71 69  BILITOT 0.3 <0.1*  PROT 6.9 6.4*  ALBUMIN 3.0* 3.1*   No results for input(s): LIPASE, AMYLASE in the last 168 hours. No results for input(s): AMMONIA in the last 168 hours. CBC:  Recent Labs Lab 06/12/16 1701 06/12/16 2136 06/13/16 0520 06/14/16 0402  WBC 12.6* 10.5 10.6* 20.7*  NEUTROABS  --  9.9*  --   --   HGB 13.0 12.9 12.2 12.0  HCT 38.9 38.6 36.9 37.3  MCV 84.4 84.3 84.4 85.9  PLT 262 269 242 311   Cardiac Enzymes:  Recent Labs Lab 06/12/16 2136 06/13/16 0516 06/13/16 1131  TROPONINI <0.03 <0.03 <0.03   BNP: BNP (last 3 results)  Recent Labs  06/12/16 2136  BNP 21.7    ProBNP (last 3 results) No results for input(s): PROBNP in the last 8760 hours.  CBG:  Recent Labs Lab 06/13/16 1132 06/13/16 1232 06/13/16 1641 06/13/16 2217 06/14/16 0759  GLUCAP 152* 163* 145* 243* 161*     Signed:  Meredeth Ide MD.  [facility] 06/14/2016, 10:54 AM

## 2016-06-14 NOTE — Progress Notes (Signed)
Pt ambulated to bathroom and in room independently on room air. Pt tolerated well and maintained appropriate oxygenation and HR.

## 2016-06-17 LAB — CULTURE, BLOOD (ROUTINE X 2)
CULTURE: NO GROWTH
CULTURE: NO GROWTH

## 2016-07-01 ENCOUNTER — Other Ambulatory Visit: Payer: Self-pay | Admitting: Allergy and Immunology

## 2016-07-30 ENCOUNTER — Other Ambulatory Visit: Payer: Self-pay | Admitting: Allergy and Immunology

## 2016-07-30 ENCOUNTER — Inpatient Hospital Stay (HOSPITAL_COMMUNITY)
Admission: EM | Admit: 2016-07-30 | Discharge: 2016-08-02 | DRG: 871 | Disposition: A | Discharge status: Home / Self Care | Payer: BC Managed Care – PPO | Attending: Internal Medicine | Admitting: Internal Medicine

## 2016-07-30 ENCOUNTER — Encounter (HOSPITAL_COMMUNITY): Payer: Self-pay | Admitting: Obstetrics and Gynecology

## 2016-07-30 ENCOUNTER — Emergency Department (HOSPITAL_COMMUNITY): Payer: BC Managed Care – PPO

## 2016-07-30 DIAGNOSIS — Z8249 Family history of ischemic heart disease and other diseases of the circulatory system: Secondary | ICD-10-CM | POA: Diagnosis not present

## 2016-07-30 DIAGNOSIS — R739 Hyperglycemia, unspecified: Secondary | ICD-10-CM | POA: Diagnosis present

## 2016-07-30 DIAGNOSIS — J4551 Severe persistent asthma with (acute) exacerbation: Secondary | ICD-10-CM | POA: Diagnosis present

## 2016-07-30 DIAGNOSIS — Z791 Long term (current) use of non-steroidal anti-inflammatories (NSAID): Secondary | ICD-10-CM | POA: Diagnosis not present

## 2016-07-30 DIAGNOSIS — J189 Pneumonia, unspecified organism: Secondary | ICD-10-CM | POA: Diagnosis present

## 2016-07-30 DIAGNOSIS — J9601 Acute respiratory failure with hypoxia: Secondary | ICD-10-CM | POA: Diagnosis present

## 2016-07-30 DIAGNOSIS — F17200 Nicotine dependence, unspecified, uncomplicated: Secondary | ICD-10-CM

## 2016-07-30 DIAGNOSIS — M25569 Pain in unspecified knee: Secondary | ICD-10-CM | POA: Diagnosis present

## 2016-07-30 DIAGNOSIS — I110 Hypertensive heart disease with heart failure: Secondary | ICD-10-CM | POA: Diagnosis present

## 2016-07-30 DIAGNOSIS — Z86718 Personal history of other venous thrombosis and embolism: Secondary | ICD-10-CM

## 2016-07-30 DIAGNOSIS — Z7951 Long term (current) use of inhaled steroids: Secondary | ICD-10-CM | POA: Diagnosis not present

## 2016-07-30 DIAGNOSIS — J9691 Respiratory failure, unspecified with hypoxia: Secondary | ICD-10-CM | POA: Diagnosis present

## 2016-07-30 DIAGNOSIS — Z833 Family history of diabetes mellitus: Secondary | ICD-10-CM

## 2016-07-30 DIAGNOSIS — F17210 Nicotine dependence, cigarettes, uncomplicated: Secondary | ICD-10-CM | POA: Diagnosis present

## 2016-07-30 DIAGNOSIS — Y95 Nosocomial condition: Secondary | ICD-10-CM | POA: Diagnosis present

## 2016-07-30 DIAGNOSIS — F419 Anxiety disorder, unspecified: Secondary | ICD-10-CM | POA: Diagnosis present

## 2016-07-30 DIAGNOSIS — Z79899 Other long term (current) drug therapy: Secondary | ICD-10-CM | POA: Diagnosis not present

## 2016-07-30 DIAGNOSIS — G4700 Insomnia, unspecified: Secondary | ICD-10-CM | POA: Diagnosis present

## 2016-07-30 DIAGNOSIS — K219 Gastro-esophageal reflux disease without esophagitis: Secondary | ICD-10-CM | POA: Diagnosis present

## 2016-07-30 DIAGNOSIS — A419 Sepsis, unspecified organism: Secondary | ICD-10-CM | POA: Diagnosis present

## 2016-07-30 DIAGNOSIS — J9621 Acute and chronic respiratory failure with hypoxia: Secondary | ICD-10-CM

## 2016-07-30 DIAGNOSIS — I4581 Long QT syndrome: Secondary | ICD-10-CM | POA: Diagnosis present

## 2016-07-30 DIAGNOSIS — Z6841 Body Mass Index (BMI) 40.0 and over, adult: Secondary | ICD-10-CM

## 2016-07-30 DIAGNOSIS — I5032 Chronic diastolic (congestive) heart failure: Secondary | ICD-10-CM | POA: Diagnosis present

## 2016-07-30 DIAGNOSIS — E669 Obesity, unspecified: Secondary | ICD-10-CM | POA: Diagnosis present

## 2016-07-30 DIAGNOSIS — T486X5A Adverse effect of antiasthmatics, initial encounter: Secondary | ICD-10-CM | POA: Diagnosis present

## 2016-07-30 DIAGNOSIS — Z720 Tobacco use: Secondary | ICD-10-CM | POA: Diagnosis not present

## 2016-07-30 DIAGNOSIS — F1011 Alcohol abuse, in remission: Secondary | ICD-10-CM | POA: Diagnosis present

## 2016-07-30 DIAGNOSIS — I10 Essential (primary) hypertension: Secondary | ICD-10-CM | POA: Diagnosis not present

## 2016-07-30 LAB — COMPREHENSIVE METABOLIC PANEL
ALT: 14 U/L (ref 14–54)
AST: 26 U/L (ref 15–41)
Albumin: 3.8 g/dL (ref 3.5–5.0)
Alkaline Phosphatase: 83 U/L (ref 38–126)
Anion gap: 8 (ref 5–15)
BUN: 10 mg/dL (ref 6–20)
CO2: 24 mmol/L (ref 22–32)
CREATININE: 0.69 mg/dL (ref 0.44–1.00)
Calcium: 8.9 mg/dL (ref 8.9–10.3)
Chloride: 104 mmol/L (ref 101–111)
GFR calc Af Amer: >60 mL/min (ref >60)
Glucose, Bld: 135 mg/dL — ABNORMAL HIGH (ref 65–99)
Potassium: 3.9 mmol/L (ref 3.5–5.1)
Sodium: 136 mmol/L (ref 135–145)
TOTAL PROTEIN: 7.4 g/dL (ref 6.5–8.1)
Total Bilirubin: 1 mg/dL (ref 0.3–1.2)

## 2016-07-30 LAB — CBC WITH DIFFERENTIAL/PLATELET
BASOS ABS: 0 10*3/uL (ref 0.0–0.1)
Basophils Relative: 0 %
EOS PCT: 5 %
Eosinophils Absolute: 0.8 10*3/uL — ABNORMAL HIGH (ref 0.0–0.7)
HCT: 38.8 % (ref 36.0–46.0)
Hemoglobin: 12.6 g/dL (ref 12.0–15.0)
Lymphocytes Relative: 12 %
Lymphs Abs: 1.9 10*3/uL (ref 0.7–4.0)
MCH: 26.9 pg (ref 26.0–34.0)
MCHC: 32.5 g/dL (ref 30.0–36.0)
MCV: 82.7 fL (ref 78.0–100.0)
Monocytes Absolute: 0.8 10*3/uL (ref 0.1–1.0)
Monocytes Relative: 5 %
Neutro Abs: 11.9 10*3/uL — ABNORMAL HIGH (ref 1.7–7.7)
Neutrophils Relative %: 78 %
PLATELETS: 231 10*3/uL (ref 150–400)
RBC: 4.69 MIL/uL (ref 3.87–5.11)
RDW: 14.5 % (ref 11.5–15.5)
WBC: 15.4 10*3/uL — AB (ref 4.0–10.5)

## 2016-07-30 LAB — I-STAT CG4 LACTIC ACID, ED: Lactic Acid, Venous: 1.4 mmol/L (ref 0.5–1.9)

## 2016-07-30 LAB — LACTIC ACID, PLASMA: LACTIC ACID, VENOUS: 1.5 mmol/L (ref 0.5–1.9)

## 2016-07-30 MED ORDER — METOPROLOL SUCCINATE ER 25 MG PO TB24
25.0000 mg | ORAL_TABLET | Freq: Every day | ORAL | Status: DC
  Administered 2016-07-31 – 2016-08-02 (×3): 25 mg via ORAL
  Filled 2016-07-30 (×3): qty 1

## 2016-07-30 MED ORDER — SODIUM CHLORIDE 0.9 % IV BOLUS (SEPSIS)
1000.0000 mL | Freq: Once | INTRAVENOUS | Status: AC
  Administered 2016-07-30: 1000 mL via INTRAVENOUS

## 2016-07-30 MED ORDER — VANCOMYCIN HCL IN DEXTROSE 1-5 GM/200ML-% IV SOLN
1000.0000 mg | Freq: Three times a day (TID) | INTRAVENOUS | Status: DC
  Administered 2016-07-31 – 2016-08-02 (×6): 1000 mg via INTRAVENOUS
  Filled 2016-07-30 (×4): qty 200

## 2016-07-30 MED ORDER — ACETAMINOPHEN 650 MG RE SUPP: 650.0000 mg | Freq: Four times a day (QID) | RECTAL | Status: DC | PRN

## 2016-07-30 MED ORDER — ONDANSETRON HCL 4 MG/2ML IJ SOLN: 4.0000 mg | Freq: Four times a day (QID) | INTRAMUSCULAR | Status: DC | PRN

## 2016-07-30 MED ORDER — IPRATROPIUM-ALBUTEROL 0.5-2.5 (3) MG/3ML IN SOLN
3.0000 mL | Freq: Once | RESPIRATORY_TRACT | Status: AC
  Administered 2016-07-30: 3 mL via RESPIRATORY_TRACT
  Filled 2016-07-30: qty 3

## 2016-07-30 MED ORDER — IPRATROPIUM-ALBUTEROL 0.5-2.5 (3) MG/3ML IN SOLN
3.0000 mL | RESPIRATORY_TRACT | Status: DC | PRN
  Administered 2016-07-31: 3 mL via RESPIRATORY_TRACT
  Filled 2016-07-30 (×2): qty 3

## 2016-07-30 MED ORDER — ENOXAPARIN SODIUM 40 MG/0.4ML ~~LOC~~ SOLN
40.0000 mg | Freq: Every day | SUBCUTANEOUS | Status: DC
  Administered 2016-07-31 – 2016-08-01 (×3): 40 mg via SUBCUTANEOUS
  Filled 2016-07-30 (×3): qty 0.4

## 2016-07-30 MED ORDER — METHYLPREDNISOLONE SODIUM SUCC 125 MG IJ SOLR
125.0000 mg | Freq: Once | INTRAMUSCULAR | Status: AC
  Administered 2016-07-30: 125 mg via INTRAVENOUS
  Filled 2016-07-30: qty 2

## 2016-07-30 MED ORDER — MONTELUKAST SODIUM 10 MG PO TABS
10.0000 mg | ORAL_TABLET | Freq: Every day | ORAL | Status: DC
  Administered 2016-07-31 – 2016-08-01 (×3): 10 mg via ORAL
  Filled 2016-07-30 (×3): qty 1

## 2016-07-30 MED ORDER — DEXTROSE 5 % IV SOLN
1.0000 g | Freq: Three times a day (TID) | INTRAVENOUS | Status: DC
  Administered 2016-07-31 – 2016-08-02 (×7): 1 g via INTRAVENOUS
  Filled 2016-07-30 (×8): qty 1

## 2016-07-30 MED ORDER — GABAPENTIN 300 MG PO CAPS
900.0000 mg | ORAL_CAPSULE | Freq: Two times a day (BID) | ORAL | Status: DC
  Administered 2016-07-31 – 2016-08-02 (×6): 900 mg via ORAL
  Filled 2016-07-30 (×6): qty 3

## 2016-07-30 MED ORDER — DEXTROSE 5 % IV SOLN
2.0000 g | Freq: Once | INTRAVENOUS | Status: AC
  Administered 2016-07-30: 2 g via INTRAVENOUS
  Filled 2016-07-30: qty 2

## 2016-07-30 MED ORDER — VANCOMYCIN HCL IN DEXTROSE 1-5 GM/200ML-% IV SOLN: 1000.0000 mg | Freq: Once | INTRAVENOUS | Status: DC

## 2016-07-30 MED ORDER — IPRATROPIUM-ALBUTEROL 0.5-2.5 (3) MG/3ML IN SOLN
3.0000 mL | Freq: Once | RESPIRATORY_TRACT | Status: DC
Start: 2016-07-30 — End: 2016-08-02

## 2016-07-30 MED ORDER — VITAMIN B-1 100 MG PO TABS
100.0000 mg | ORAL_TABLET | Freq: Every day | ORAL | Status: DC
  Administered 2016-07-31 – 2016-08-02 (×3): 100 mg via ORAL
  Filled 2016-07-30 (×3): qty 1

## 2016-07-30 MED ORDER — ONDANSETRON HCL 4 MG PO TABS: 4.0000 mg | ORAL_TABLET | Freq: Four times a day (QID) | ORAL | Status: DC | PRN

## 2016-07-30 MED ORDER — ALBUTEROL SULFATE (2.5 MG/3ML) 0.083% IN NEBU
5.0000 mg | INHALATION_SOLUTION | Freq: Once | RESPIRATORY_TRACT | Status: AC
  Administered 2016-07-30: 5 mg via RESPIRATORY_TRACT
  Filled 2016-07-30: qty 6

## 2016-07-30 MED ORDER — NICOTINE 21 MG/24HR TD PT24
21.0000 mg | MEDICATED_PATCH | Freq: Every day | TRANSDERMAL | Status: DC
  Administered 2016-07-31 – 2016-08-02 (×4): 21 mg via TRANSDERMAL
  Filled 2016-07-30 (×4): qty 1

## 2016-07-30 MED ORDER — MELATONIN 10 MG PO TABS: 10.0000 mg | ORAL_TABLET | Freq: Every day | ORAL | Status: DC

## 2016-07-30 MED ORDER — QUETIAPINE FUMARATE 100 MG PO TABS
100.0000 mg | ORAL_TABLET | Freq: Every day | ORAL | Status: DC
  Administered 2016-07-31 – 2016-08-01 (×3): 100 mg via ORAL
  Filled 2016-07-30 (×3): qty 1

## 2016-07-30 MED ORDER — SODIUM CHLORIDE 0.9% FLUSH
3.0000 mL | Freq: Two times a day (BID) | INTRAVENOUS | Status: DC
  Administered 2016-07-31 – 2016-08-02 (×3): 3 mL via INTRAVENOUS

## 2016-07-30 MED ORDER — IPRATROPIUM-ALBUTEROL 0.5-2.5 (3) MG/3ML IN SOLN
3.0000 mL | Freq: Four times a day (QID) | RESPIRATORY_TRACT | Status: DC
Start: 2016-07-30 — End: 2016-07-31
  Administered 2016-07-30 – 2016-07-31 (×2): 3 mL via RESPIRATORY_TRACT
  Filled 2016-07-30: qty 3

## 2016-07-30 MED ORDER — NALTREXONE HCL 50 MG PO TABS
50.0000 mg | ORAL_TABLET | Freq: Every day | ORAL | Status: DC
  Administered 2016-07-31 – 2016-08-02 (×3): 50 mg via ORAL
  Filled 2016-07-30 (×3): qty 1

## 2016-07-30 MED ORDER — SERTRALINE HCL 50 MG PO TABS
75.0000 mg | ORAL_TABLET | Freq: Every day | ORAL | Status: DC
  Administered 2016-07-31 – 2016-08-02 (×3): 75 mg via ORAL
  Filled 2016-07-30 (×3): qty 2

## 2016-07-30 MED ORDER — PANTOPRAZOLE SODIUM 40 MG PO TBEC
40.0000 mg | DELAYED_RELEASE_TABLET | Freq: Two times a day (BID) | ORAL | Status: DC
  Administered 2016-07-31 – 2016-08-02 (×6): 40 mg via ORAL
  Filled 2016-07-30 (×6): qty 1

## 2016-07-30 MED ORDER — METHYLPREDNISOLONE SODIUM SUCC 125 MG IJ SOLR
60.0000 mg | Freq: Three times a day (TID) | INTRAMUSCULAR | Status: DC
  Administered 2016-07-31 – 2016-08-02 (×7): 60 mg via INTRAVENOUS
  Filled 2016-07-30 (×7): qty 2

## 2016-07-30 MED ORDER — LEVOFLOXACIN IN D5W 750 MG/150ML IV SOLN: 750.0000 mg | Freq: Once | INTRAVENOUS | Status: DC

## 2016-07-30 MED ORDER — VANCOMYCIN HCL IN DEXTROSE 1-5 GM/200ML-% IV SOLN
1000.0000 mg | Freq: Once | INTRAVENOUS | Status: AC
  Administered 2016-07-30: 1000 mg via INTRAVENOUS
  Filled 2016-07-30: qty 200

## 2016-07-30 MED ORDER — ACETAMINOPHEN 325 MG PO TABS: 650.0000 mg | ORAL_TABLET | Freq: Four times a day (QID) | ORAL | Status: DC | PRN

## 2016-07-30 NOTE — ED Provider Notes (Signed)
[facility]-EMERGENCY DEPT Provider Note   CSN: [id number] Arrival date & time: 07/30/16  1938     History   Chief Complaint Chief Complaint  Patient presents with  . Shortness of Breath    HPI Kara [name] is a 31 y.o. female.  The history is provided by the patient.  Shortness of Breath  This is a new problem. The average episode lasts 4 days. The problem occurs continuously.Episode onset: 4 days ago. The problem has not changed since onset.Associated symptoms include cough (same time as shob) and orthopnea. Pertinent negatives include no fever (but has had night sweats), no hemoptysis, no syncope, no vomiting, no abdominal pain and no leg swelling. Precipitated by: 1 ppd smoker and any activity. She has tried nothing for the symptoms. She has had prior hospitalizations. She has had prior ED visits. She has had prior ICU admissions. Associated medical issues include pneumonia.    Past Medical History:  Diagnosis Date  . Alcohol abuse   . Asthma   . Environmental allergies   . GERD (gastroesophageal reflux disease)   . Headache   . Hypertension   . Pancreatitis   . Prolonged QT syndrome   . Syncope and collapse   . Vision abnormalities     Patient Active Problem List   Diagnosis Date Noted  . CAP (community acquired pneumonia) 06/13/2016  . Asthma exacerbation 06/12/2016  . Alcohol abuse, in remission 06/12/2016  . Sepsis (HCC) 06/12/2016  . Pancreatitis 09/09/2015  . GERD (gastroesophageal reflux disease) 09/09/2015  . Benign essential HTN 09/09/2015  . FUO (fever of unknown origin)   . Abdominal hematoma 02/10/2015  . Warfarin-induced coagulopathy (HCC) 02/10/2015  . Hypertension 02/10/2015  . Hypoalbuminemia due to protein-calorie malnutrition (HCC) 02/10/2015  . Alcoholic pancreatitis 02/10/2015  . Acute blood loss anemia   . Intra-abdominal hematoma 02/09/2015  . Pancreatitis, acute   . 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 Acute pancreatitis 01/29/2015  .  Sinus tachycardia 01/29/2015  . Leukocytosis 01/29/2015  . AKI (acute kidney injury) (HCC) 01/29/2015  . Insomnia 10/29/2014  . Polyneuropathy (HCC) 09/02/2014  . History of alcohol abuse 09/02/2014  . Edema 09/02/2014  . Neck pain 09/02/2014  . Chronic lower back pain 09/02/2014  . Numbness of foot 08/02/2014  . Hand numbness 08/02/2014  . Ataxic gait 08/02/2014  . Proximal leg weakness 08/02/2014  . Constipation   . Intractable abdominal pain 07/16/2014  . Hypothermia 07/16/2014  . Upper abdominal pain   . Abdominal pain, epigastric 07/06/2014  . Obesity (BMI 30-39.9) 07/06/2014  . Chronic diastolic heart failure (HCC) 07/06/2014  . Syncope 04/08/2014  . Prolonged Q-T interval on ECG 04/08/2014  . Hypokalemia 04/08/2014  . Elevated LFTs 04/08/2014    Past Surgical History:  Procedure Laterality Date  . ESOPHAGOGASTRODUODENOSCOPY N/A 07/17/2014   Procedure: ESOPHAGOGASTRODUODENOSCOPY (EGD);  Surgeon: Dorena Cookey, MD;  Location: [facility];  Service: Endoscopy;  Laterality: N/A;  . TYMPANOSTOMY TUBE PLACEMENT      OB History    No data available       Home Medications    Prior to Admission medications   Medication Sig Start Date End Date Taking? Authorizing Provider  albuterol (PROVENTIL HFA;VENTOLIN HFA) 108 (90 Base) MCG/ACT inhaler Inhale 2 puffs into the lungs every 6 (six) hours as needed for wheezing or shortness of breath.   Yes Historical Provider, MD  budesonide-formoterol (SYMBICORT) 160-4.5 MCG/ACT inhaler Inhale 2 puffs into the lungs 2 (two) times daily.   Yes Historical Provider, MD  Cholecalciferol (VITAMIN D PO) Take 1 tablet by mouth at bedtime.   Yes Historical Provider, MD  gabapentin (NEURONTIN) 300 MG capsule Take 900 mg by mouth 2 (two) times daily.   Yes Historical Provider, MD  ipratropium-albuterol (DUONEB) 0.5-2.5 (3) MG/3ML SOLN USE 1 VIAL PER NEBULIZER EVERY 6 HOURS  FOR 30 DAYS 07/27/16  Yes Historical Provider, MD  Melatonin 10 MG TABS Take  10 mg by mouth at bedtime.   Yes Historical Provider, MD  meloxicam (MOBIC) 15 MG tablet Take 15 mg by mouth daily.   Yes Historical Provider, MD  metoprolol succinate (TOPROL-XL) 25 MG 24 hr tablet Take 1 tablet (25 mg total) by mouth daily. *Please call and schedule a one year follow up appointment* 06/07/16  Yes Wendall Stade, MD  montelukast (SINGULAIR) 10 MG tablet TAKE 1 TABLET BY MOUTH DAILY Patient taking differently: TAKE 1 TABLET BY MOUTH once daily at bedtime 12/08/15  Yes Jessica Priest, MD  Multiple Vitamins-Minerals (MULTIVITAMIN WITH MINERALS) tablet Take 1 tablet by mouth daily.   Yes Historical Provider, MD  naltrexone (DEPADE) 50 MG tablet Take 50 mg by mouth daily.   Yes Historical Provider, MD  omeprazole (PRILOSEC) 40 MG capsule TAKE 1 CAPSULE(40 MG) BY MOUTH DAILY 07/01/16  Yes Jessica Priest, MD  QUEtiapine (SEROQUEL) 50 MG tablet Take 100 mg by mouth at bedtime.    Yes Historical Provider, MD  sertraline (ZOLOFT) 50 MG tablet Take 75 mg by mouth daily.   Yes Historical Provider, MD  thiamine 100 MG tablet Take 100 mg by mouth daily.   Yes Historical Provider, MD  EPINEPHrine (EPIPEN 2-PAK) 0.3 mg/0.3 mL IJ SOAJ injection Inject 0.3 mg into the muscle once as needed (for severe allergic reaction).    Historical Provider, MD  levofloxacin (LEVAQUIN) 750 MG tablet Take 1 tablet (750 mg total) by mouth daily. Patient not taking: Reported on 07/30/2016 06/14/16   Meredeth Ide, MD  oxyCODONE-acetaminophen (PERCOCET) 5-325 MG tablet Take 1-2 tablets by mouth every 4 (four) hours as needed. Patient not taking: Reported on 06/12/2016 01/28/16   Rolan Bucco, MD  predniSONE (DELTASONE) 10 MG tablet Prednisone 40 mg po daily x 1 day then Prednisone 30 mg po daily x 1 day then Prednisone 20 mg po daily x 1 day then Prednisone 10 mg daily x 1 day then stop... Patient not taking: Reported on 07/30/2016 06/14/16   Meredeth Ide, MD    Family History Family History  Problem Relation Age of  Onset  . Heart failure Mother   . Heart failure Father   . Diabetes Father   . Heart failure Maternal Grandfather     Social History Social History  Substance Use Topics  . Smoking status: Current Every Day Smoker    Packs/day: 1.00    Years: 5.00    Types: Cigarettes    Last attempt to quit: 04/08/2012  . Smokeless tobacco: Never Used  . Alcohol use No     Allergies   Peanuts [peanut oil]   Review of Systems Review of Systems  Constitutional: Negative for fever (but has had night sweats).  Respiratory: Positive for cough (same time as shob) and shortness of breath. Negative for hemoptysis.   Cardiovascular: Positive for orthopnea. Negative for leg swelling and syncope.  Gastrointestinal: Negative for abdominal pain and vomiting.  All other systems reviewed and are negative.    Physical Exam Updated Vital Signs BP (!) 141/74 (BP Location: Left Arm)   Pulse Marland Kitchen)  132   Temp 99.4 F (37.4 C) (Oral)   Resp (!) 22   Ht  (1.651 m)   Wt 235 lb (106.6 kg)   LMP 07/09/2016   SpO2 91%   BMI 39.11 kg/m   Physical Exam  Constitutional: She is oriented to person, place, and time. She appears well-developed and well-nourished. No distress.  HENT:  Head: Normocephalic.  Nose: Nose normal.  Eyes: Conjunctivae are normal.  Neck: Neck supple. No tracheal deviation present.  Cardiovascular: Regular rhythm and normal heart sounds.  Tachycardia present.   Pulmonary/Chest: Effort normal. No accessory muscle usage. Tachypnea noted. No respiratory distress. She has wheezes (expiratory bilateral).  Abdominal: Soft. She exhibits no distension. There is no tenderness.  Neurological: She is alert and oriented to person, place, and time.  Skin: Skin is warm and dry.  Psychiatric: She has a normal mood and affect.     ED Treatments / Results  Labs (all labs ordered are listed, but only abnormal results are displayed) Labs Reviewed  COMPREHENSIVE METABOLIC PANEL - Abnormal;  Notable for the following:       Result Value   Glucose, Bld 135 (*)    All other components within normal limits  CBC WITH DIFFERENTIAL/PLATELET - Abnormal; Notable for the following:    WBC 15.4 (*)    Neutro Abs 11.9 (*)    Eosinophils Absolute 0.8 (*)    All other components within normal limits  CULTURE, BLOOD (ROUTINE X 2)  CULTURE, BLOOD (ROUTINE X 2)  GRAM STAIN  CULTURE, EXPECTORATED SPUTUM-ASSESSMENT  CULTURE, RESPIRATORY (NON-EXPECTORATED)  PROCALCITONIN  LACTIC ACID, PLASMA  URINALYSIS, ROUTINE W REFLEX MICROSCOPIC  CREATININE, SERUM  STREP PNEUMONIAE URINARY ANTIGEN  LACTIC ACID, PLASMA  I-STAT CG4 LACTIC ACID, ED    EKG  EKG Interpretation  Date/Time:  Friday July 30 2016 19:58:35 EDT Ventricular Rate:  130 PR Interval:    QRS Duration: 89 QT Interval:  313 QTC Calculation: 461 R Axis:   86 Text Interpretation:  Sinus tachycardia Since last tracing rate faster Otherwise no significant change Confirmed by Brendolyn Stockley MD, Katianne Barre [phone]) on 07/30/2016 8:27:39 PM       Radiology Dg Chest 2 View  Result Date: 07/30/2016 CLINICAL DATA:  Dyspnea and shortness of breath. Admitted to ICU for pneumonia 2 months prior. EXAM: CHEST  2 VIEW COMPARISON:  Chest radiograph June 12, 2016 FINDINGS: Cardiomediastinal silhouette is normal. Diffuse mild interstitial prominence with faint patchy airspace opacities similar to slightly improved. No pleural effusion. No pneumothorax. Soft tissue planes included osseous structures are normal, large body habitus. IMPRESSION: Interstitial and alveolar airspace opacities concerning for residual versus recurrent bronchopneumonia. Electronically Signed   By: Awilda Metro M.D.   On: 07/30/2016 20:22    Procedures Procedures (including critical care time)  Medications Ordered in ED Medications  ceFEPIme (MAXIPIME) 2 g in dextrose 5 % 50 mL IVPB (2 g Intravenous New Bag/Given 07/30/16 2100)  vancomycin (VANCOCIN) IVPB 1000 mg/200 mL  premix (1,000 mg Intravenous New Bag/Given 07/30/16 2106)  vancomycin (VANCOCIN) IVPB 1000 mg/200 mL premix (not administered)  ipratropium-albuterol (DUONEB) 0.5-2.5 (3) MG/3ML nebulizer solution 3 mL (not administered)    Followed by  ipratropium-albuterol (DUONEB) 0.5-2.5 (3) MG/3ML nebulizer solution 3 mL (not administered)  albuterol (PROVENTIL) (2.5 MG/3ML) 0.083% nebulizer solution 5 mg (5 mg Nebulization Given 07/30/16 1959)  sodium chloride 0.9 % bolus 1,000 mL (1,000 mLs Intravenous New Bag/Given 07/30/16 2053)    And  sodium chloride 0.9 % bolus 1,000  mL (1,000 mLs Intravenous New Bag/Given 07/30/16 2044)  methylPREDNISolone sodium succinate (SOLU-MEDROL) 125 mg/2 mL injection 125 mg (125 mg Intravenous Given 07/30/16 2059)     Initial Impression / Assessment and Plan / ED Course  I have reviewed the triage vital signs and the nursing notes.  Pertinent labs & imaging results that were available during my care of the patient were reviewed by me and considered in my medical decision making (see chart for details).     31 y.o. female presents with increasing shortness of breath and hypoxemia. Has h/o recent admission for pneumonia and asthma exacerbation. Continues to smoke. Has WBC elevation, vital sign abnormalities, and interstitial opacities concerning for HCAP given recent admission. Covered with vanc/cefepime. Fluid resuscitated. Hospitalist was consulted for admission and will see the patient in the emergency department.   Final Clinical Impressions(s) / ED Diagnoses   Final diagnoses:  HCAP (healthcare-associated pneumonia)  Sepsis, due to unspecified organism (HCC)  Severe persistent asthma with exacerbation  Unable to stop smoking    New Prescriptions New Prescriptions   No medications on file     Lyndal Pulley, MD 07/31/16 [phone]

## 2016-07-30 NOTE — Progress Notes (Signed)
Pharmacy Antibiotic Note  Kara [name] is a 31 y.o. female admitted on 07/30/2016 with. pneumonia.  Pharmacy has been consulted for vancomycin and cefepime dosing.  Plan:  Cefepime 2g IV x 1, then  Vancomycin 2g total IV, then 1g q8h  Check trough at steady state  Follow up renal function & cultures, de-escalate as appropriate  Height:  (165.1 cm) Weight: 235 lb (106.6 kg) IBW/kg (Calculated) : 57  Temp (24hrs), Avg:99.4 F (37.4 C), Min:99.4 F (37.4 C), Max:99.4 F (37.4 C)   Recent Labs Lab 07/30/16 2050 07/30/16 2053  WBC 15.4*  --   LATICACIDVEN  --  1.40    CrCl cannot be calculated (Patient's most recent lab result is older than the maximum 21 days allowed.).    Allergies  Allergen Reactions  . Peanuts [Peanut Oil] Hives and Itching    Antimicrobials this admission:  4/13 Vancomycin >> 4/13 Cefepime >>  Dose adjustments this admission:    Microbiology results:  4/13 BCx: sent   Thank you for allowing pharmacy to be a part of this patient's care.  Loralee Pacas, PharmD, BCPS Pager: [phone] 07/30/2016 9:27 PM

## 2016-07-30 NOTE — ED Triage Notes (Signed)
Pt states she has been struggling with pneumonia for almost 2 months. She finally felt it was under control about 2 weeks ago and then with the weather changed she is back to being SOB and feeling like she can't do anything without "getting winded." Pt was hospitalized 2 months ago to an ICU. Pt has tried her rescue inhaler, mucinex, and nebulizer treatments to get the wheezing and difficulty breathing under control with no help.

## 2016-07-30 NOTE — H&P (Signed)
History and Physical    Kara [name] AVW:[id number] DOB: 07-Nov-1985 DOA: 07/30/2016  Referring MD/NP/PA: Dr. Clydene Pugh PCP: Duane Lope, MD  Patient coming from: Dr. office  Chief Complaint: Shortness of breath  HPI: Kara [name] is a 31 y.o. female with medical history significant of H/O alcohol abuse, alcohol pancreatitis, asthma with allergic rhinitis, HTN, s/p splenic vein thrombosis, hx of Prolonged QTC, polyneuropathy; who presents with four-day history of progressively worsening shortness of breath over the last 4 days. Last admitted to the hospital from 2/24 through 2/26 for acute respiratory failure secondary to community-acquired pneumonia.She states that she was discharged home on Levaquin and prednisone taper for which she completed medications as prescribed. Initially reported feeling better in subsequent weeks. However, with the recent weather changes patient noted changes in her breathing. Subsequently, noticed developing a cough that became productive with green thick sputum. Any activity and seem to worsen shortness of breath. Associated symptoms included night sweats and chest tightness. She also admits to smoking approximately 1 pack of cigarettes per day on average, but denies any alcohol use. Denies any relief of symptoms utilizing current home medications. She went to the doctor's office today where her oxygen saturations were noted to be in the 80s on room air. She was sent to the emergency department for further evaluation.  ED Course: Upon admission into the emergency department patient was seen to have a temperature of 99.69F, heart rates 118-132, respirations 20-23, and O2 saturations 91-96% on 2 L nasal cannula oxygen. Labs revealed WBC 15.4 and a lactic acid 1.4. Sepsis protocol was initiated and patient received approximately 4 L of fluid along with empiric antibiotics of vancomycin and cefepimewhile in the ED.   Review of Systems: As per HPI otherwise 10 point review of  systems negative.   Past Medical History:  Diagnosis Date  . Alcohol abuse   . Asthma   . Environmental allergies   . GERD (gastroesophageal reflux disease)   . Headache   . Hypertension   . Pancreatitis   . Prolonged QT syndrome   . Syncope and collapse   . Vision abnormalities     Past Surgical History:  Procedure Laterality Date  . ESOPHAGOGASTRODUODENOSCOPY N/A 07/17/2014   Procedure: ESOPHAGOGASTRODUODENOSCOPY (EGD);  Surgeon: Dorena Cookey, MD;  Location: [facility];  Service: Endoscopy;  Laterality: N/A;  . TYMPANOSTOMY TUBE PLACEMENT       reports that she has been smoking Cigarettes.  She has a 5.00 pack-year smoking history. She has never used smokeless tobacco. She reports that she does not drink alcohol or use drugs.  Allergies  Allergen Reactions  . Peanuts [Peanut Oil] Hives and Itching    Family History  Problem Relation Age of Onset  . Heart failure Mother   . Heart failure Father   . Diabetes Father   . Heart failure Maternal Grandfather     Prior to Admission medications   Medication Sig Start Date End Date Taking? Authorizing Provider  albuterol (PROVENTIL HFA;VENTOLIN HFA) 108 (90 Base) MCG/ACT inhaler Inhale 2 puffs into the lungs every 6 (six) hours as needed for wheezing or shortness of breath.   Yes Historical Provider, MD  budesonide-formoterol (SYMBICORT) 160-4.5 MCG/ACT inhaler Inhale 2 puffs into the lungs 2 (two) times daily.   Yes Historical Provider, MD  Cholecalciferol (VITAMIN D PO) Take 1 tablet by mouth at bedtime.   Yes Historical Provider, MD  gabapentin (NEURONTIN) 300 MG capsule Take 900 mg by mouth 2 (two) times daily.   Yes  Historical Provider, MD  ipratropium-albuterol (DUONEB) 0.5-2.5 (3) MG/3ML SOLN USE 1 VIAL PER NEBULIZER EVERY 6 HOURS  FOR 30 DAYS 07/27/16  Yes Historical Provider, MD  Melatonin 10 MG TABS Take 10 mg by mouth at bedtime.   Yes Historical Provider, MD  meloxicam (MOBIC) 15 MG tablet Take 15 mg by mouth daily.    Yes Historical Provider, MD  metoprolol succinate (TOPROL-XL) 25 MG 24 hr tablet Take 1 tablet (25 mg total) by mouth daily. *Please call and schedule a one year follow up appointment* 06/07/16  Yes Wendall Stade, MD  montelukast (SINGULAIR) 10 MG tablet TAKE 1 TABLET BY MOUTH DAILY Patient taking differently: TAKE 1 TABLET BY MOUTH once daily at bedtime 12/08/15  Yes Jessica Priest, MD  Multiple Vitamins-Minerals (MULTIVITAMIN WITH MINERALS) tablet Take 1 tablet by mouth daily.   Yes Historical Provider, MD  naltrexone (DEPADE) 50 MG tablet Take 50 mg by mouth daily.   Yes Historical Provider, MD  omeprazole (PRILOSEC) 40 MG capsule TAKE 1 CAPSULE(40 MG) BY MOUTH DAILY 07/01/16  Yes Jessica Priest, MD  QUEtiapine (SEROQUEL) 50 MG tablet Take 100 mg by mouth at bedtime.    Yes Historical Provider, MD  sertraline (ZOLOFT) 50 MG tablet Take 75 mg by mouth daily.   Yes Historical Provider, MD  thiamine 100 MG tablet Take 100 mg by mouth daily.   Yes Historical Provider, MD  EPINEPHrine (EPIPEN 2-PAK) 0.3 mg/0.3 mL IJ SOAJ injection Inject 0.3 mg into the muscle once as needed (for severe allergic reaction).    Historical Provider, MD  levofloxacin (LEVAQUIN) 750 MG tablet Take 1 tablet (750 mg total) by mouth daily. Patient not taking: Reported on 07/30/2016 06/14/16   Meredeth Ide, MD  oxyCODONE-acetaminophen (PERCOCET) 5-325 MG tablet Take 1-2 tablets by mouth every 4 (four) hours as needed. Patient not taking: Reported on 06/12/2016 01/28/16   Rolan Bucco, MD  predniSONE (DELTASONE) 10 MG tablet Prednisone 40 mg po daily x 1 day then Prednisone 30 mg po daily x 1 day then Prednisone 20 mg po daily x 1 day then Prednisone 10 mg daily x 1 day then stop... Patient not taking: Reported on 07/30/2016 06/14/16   Meredeth Ide, MD    Physical Exam:   Constitutional: Young female who appears to be in moderate respiratory distress. Vitals:   07/30/16 1947 07/30/16 1948 07/30/16 2122 07/30/16 2154  BP: (!)  141/74  129/73 129/84  Pulse: (!) 132  (!) 122 (!) 118  Resp: (!) 22  (!) 23 20  Temp: 99.4 F (37.4 C)     TempSrc: Oral     SpO2: 91%  96% 95%  Weight:  106.6 kg (235 lb)    Height:   (1.651 m)     Eyes: PERRL, lids and conjunctivae normal ENMT: Mucous membranes are moist. Posterior pharynx clear of any exudate or lesions. Normal dentition.  Neck: normal, supple, no masses, no thyromegaly Respiratory:Mildly tachypneic with diffuse bilateral expiratory wheezes appreciated and rhonchi. Patient talking in shorten sinuses. Cardiovascular: Tachycardic, no murmurs / rubs / gallops. No extremity edema. 2+ pedal pulses. No carotid bruits.  Abdomen: no tenderness, no masses palpated. No hepatosplenomegaly. Bowel sounds positive.  Musculoskeletal: no clubbing / cyanosis. No joint deformity upper and lower extremities. Good ROM, no contractures. Normal muscle tone.  Skin: no rashes, lesions, ulcers. No induration Neurologic: CN 2-12 grossly intact. Sensation intact, DTR normal. Strength 5/5 in all 4.  Psychiatric: Normal judgment and insight.  Alert and oriented x 3. Normal mood.     Labs on Admission: I have personally reviewed following labs and imaging studies  CBC:  Recent Labs Lab 07/30/16 2050  WBC 15.4*  NEUTROABS 11.9*  HGB 12.6  HCT 38.8  MCV 82.7  PLT 231   Basic Metabolic Panel:  Recent Labs Lab 07/30/16 2050  NA 136  K 3.9  CL 104  CO2 24  GLUCOSE 135*  BUN 10  CREATININE 0.69  CALCIUM 8.9   GFR: Estimated Creatinine Clearance: 123.5 mL/min (by C-G formula based on SCr of 0.69 mg/dL). Liver Function Tests:  Recent Labs Lab 07/30/16 2050  AST 26  ALT 14  ALKPHOS 83  BILITOT 1.0  PROT 7.4  ALBUMIN 3.8   No results for input(s): LIPASE, AMYLASE in the last 168 hours. No results for input(s): AMMONIA in the last 168 hours. Coagulation Profile: No results for input(s): INR, PROTIME in the last 168 hours. Cardiac Enzymes: No results for input(s):  CKTOTAL, CKMB, CKMBINDEX, TROPONINI in the last 168 hours. BNP (last 3 results) No results for input(s): PROBNP in the last 8760 hours. HbA1C: No results for input(s): HGBA1C in the last 72 hours. CBG: No results for input(s): GLUCAP in the last 168 hours. Lipid Profile: No results for input(s): CHOL, HDL, LDLCALC, TRIG, CHOLHDL, LDLDIRECT in the last 72 hours. Thyroid Function Tests: No results for input(s): TSH, T4TOTAL, FREET4, T3FREE, THYROIDAB in the last 72 hours. Anemia Panel: No results for input(s): VITAMINB12, FOLATE, FERRITIN, TIBC, IRON, RETICCTPCT in the last 72 hours. Urine analysis:    Component Value Date/Time   COLORURINE YELLOW 06/12/2016 2029   APPEARANCEUR HAZY (A) 06/12/2016 2029   LABSPEC 1.020 06/12/2016 2029   PHURINE 5.0 06/12/2016 2029   GLUCOSEU NEGATIVE 06/12/2016 2029   HGBUR MODERATE (A) 06/12/2016 2029   BILIRUBINUR NEGATIVE 06/12/2016 2029   KETONESUR NEGATIVE 06/12/2016 2029   PROTEINUR 30 (A) 06/12/2016 2029   UROBILINOGEN 1.0 02/16/2015 1231   NITRITE NEGATIVE 06/12/2016 2029   LEUKOCYTESUR NEGATIVE 06/12/2016 2029   Sepsis Labs: No results found for this or any previous visit (from the past 240 hour(s)).   Radiological Exams on Admission: Dg Chest 2 View  Result Date: 07/30/2016 CLINICAL DATA:  Dyspnea and shortness of breath. Admitted to ICU for pneumonia 2 months prior. EXAM: CHEST  2 VIEW COMPARISON:  Chest radiograph June 12, 2016 FINDINGS: Cardiomediastinal silhouette is normal. Diffuse mild interstitial prominence with faint patchy airspace opacities similar to slightly improved. No pleural effusion. No pneumothorax. Soft tissue planes included osseous structures are normal, large body habitus. IMPRESSION: Interstitial and alveolar airspace opacities concerning for residual versus recurrent bronchopneumonia. Electronically Signed   By: Awilda Metro M.D.   On: 07/30/2016 20:22    EKG: Independently reviewed. Sinus tachycardia 130  bpm QTC 461  Assessment/Plan Sepsis secondary to possible Healthcare associated pneumonia: Acute. Patient presents with tachycardic, tachypneic, with productive cough, and wheezing. WBC elevated at 15.4, but lactic acid reassuring 1.4. Chest x-ray showing a right sided infiltrate concerning for residual versus recurrent bronchopneumonia. Patient received a fluid bolus and empiric antibiotics. - Admit to telemetry bed - Sepsis protocol was initiated - Follow-up blood and sputum cultures - Continue empiric antibiotics of vancomycin and cefepime  Acute hypoxic respiratory failure 2/2 asthma exacerbation - Continuous pulse oximetry with nasal cannula oxygen to keep O2 saturation greater than 92% - Solu-Medrol IV every 6 hours - Duonebs scheduled and prn shortness of breath/wheezing - Brovana and Budesonide nebs bid  -  Continue Singulair  Essential hypertension - Continue metoprolol  Anxiety - Continue Zoloft  Insomnia - Continue Seroquel   H/O diastolic dysfunction grade 2:Patient appears euvolemic at this time.  - continue to monitor  Alcohol abuse, in remission - Continue naltrexone and Thiamine   Tobacco abuse: Patient reports smoking approximately 1 pack of cigarettes per day on average but is amenable to a nicotine patch while here in the hospital. - Counseled on the need of cessation of tobacco use - Nicotine patch  GERD - Pharmacy substitution of Protonix   DVT prophylaxis: Lovenox   Code Status: Full  Family Communication: No family present at bedside Disposition Plan: Likely discharge home once medically stable  Consults called: None  Admission status: Inpatient  Clydie Braun MD [facility] Pager [phone]  If 7PM-7AM, please contact night-coverage [email or web address] Password [email or web address]  07/30/2016, 10:20 PM

## 2016-07-31 DIAGNOSIS — J9601 Acute respiratory failure with hypoxia: Secondary | ICD-10-CM

## 2016-07-31 DIAGNOSIS — J189 Pneumonia, unspecified organism: Secondary | ICD-10-CM | POA: Diagnosis present

## 2016-07-31 DIAGNOSIS — Z720 Tobacco use: Secondary | ICD-10-CM | POA: Diagnosis present

## 2016-07-31 DIAGNOSIS — J9691 Respiratory failure, unspecified with hypoxia: Secondary | ICD-10-CM

## 2016-07-31 HISTORY — DX: Respiratory failure, unspecified with hypoxia: J96.91

## 2016-07-31 HISTORY — DX: Pneumonia, unspecified organism: J18.9

## 2016-07-31 LAB — BASIC METABOLIC PANEL
ANION GAP: 11 (ref 5–15)
BUN: 10 mg/dL (ref 6–20)
CHLORIDE: 107 mmol/L (ref 101–111)
CO2: 20 mmol/L — AB (ref 22–32)
Calcium: 8.6 mg/dL — ABNORMAL LOW (ref 8.9–10.3)
Creatinine, Ser: 0.6 mg/dL (ref 0.44–1.00)
GFR calc Af Amer: >60 mL/min (ref >60)
GLUCOSE: 188 mg/dL — AB (ref 65–99)
POTASSIUM: 4.2 mmol/L (ref 3.5–5.1)
Sodium: 138 mmol/L (ref 135–145)

## 2016-07-31 LAB — PHOSPHORUS: Phosphorus: 2 mg/dL — ABNORMAL LOW (ref 2.5–4.6)

## 2016-07-31 LAB — PROCALCITONIN: Procalcitonin: 0.12 ng/mL

## 2016-07-31 LAB — CBC WITH DIFFERENTIAL/PLATELET
BASOS ABS: 0 10*3/uL (ref 0.0–0.1)
Basophils Relative: 0 %
Eosinophils Absolute: 0 10*3/uL (ref 0.0–0.7)
Eosinophils Relative: 0 %
HEMATOCRIT: 36.1 % (ref 36.0–46.0)
HEMOGLOBIN: 11.4 g/dL — AB (ref 12.0–15.0)
LYMPHS ABS: 0.5 10*3/uL — AB (ref 0.7–4.0)
LYMPHS PCT: 6 %
MCH: 25.6 pg — AB (ref 26.0–34.0)
MCHC: 31.6 g/dL (ref 30.0–36.0)
MCV: 81.1 fL (ref 78.0–100.0)
Monocytes Absolute: 0.1 10*3/uL (ref 0.1–1.0)
Monocytes Relative: 1 %
NEUTROS ABS: 8.9 10*3/uL — AB (ref 1.7–7.7)
Neutrophils Relative %: 93 %
PLATELETS: 185 10*3/uL (ref 150–400)
RBC: 4.45 MIL/uL (ref 3.87–5.11)
RDW: 14.1 % (ref 11.5–15.5)
WBC: 9.6 10*3/uL (ref 4.0–10.5)

## 2016-07-31 LAB — MAGNESIUM: Magnesium: 1.9 mg/dL (ref 1.7–2.4)

## 2016-07-31 LAB — CREATININE, SERUM
Creatinine, Ser: 0.7 mg/dL (ref 0.44–1.00)
GFR calc Af Amer: >60 mL/min (ref >60)

## 2016-07-31 LAB — LACTIC ACID, PLASMA: Lactic Acid, Venous: 1.9 mmol/L (ref 0.5–1.9)

## 2016-07-31 MED ORDER — GUAIFENESIN ER 600 MG PO TB12
1200.0000 mg | ORAL_TABLET | Freq: Two times a day (BID) | ORAL | Status: DC
  Administered 2016-07-31 – 2016-08-02 (×5): 1200 mg via ORAL
  Filled 2016-07-31 (×5): qty 2

## 2016-07-31 MED ORDER — MELOXICAM 15 MG PO TABS
15.0000 mg | ORAL_TABLET | Freq: Every day | ORAL | Status: DC
  Administered 2016-08-01 – 2016-08-02 (×2): 15 mg via ORAL
  Filled 2016-07-31 (×3): qty 1

## 2016-07-31 MED ORDER — IPRATROPIUM-ALBUTEROL 0.5-2.5 (3) MG/3ML IN SOLN
3.0000 mL | Freq: Three times a day (TID) | RESPIRATORY_TRACT | Status: DC
  Administered 2016-07-31 – 2016-08-02 (×7): 3 mL via RESPIRATORY_TRACT
  Filled 2016-07-31 (×7): qty 3

## 2016-07-31 MED ORDER — SODIUM CHLORIDE 3 % IN NEBU
4.0000 mL | INHALATION_SOLUTION | Freq: Every day | RESPIRATORY_TRACT | Status: AC
Start: 2016-07-31 — End: 2016-08-02
  Administered 2016-07-31 – 2016-08-02 (×3): 4 mL via RESPIRATORY_TRACT
  Filled 2016-07-31 (×3): qty 4

## 2016-07-31 MED ORDER — SODIUM CHLORIDE 0.9 % IV SOLN
INTRAVENOUS | Status: DC
  Administered 2016-08-01 (×2): via INTRAVENOUS

## 2016-07-31 MED ORDER — CHLORHEXIDINE GLUCONATE 0.12 % MT SOLN
15.0000 mL | Freq: Two times a day (BID) | OROMUCOSAL | Status: DC
  Administered 2016-07-31 – 2016-08-02 (×5): 15 mL via OROMUCOSAL
  Filled 2016-07-31 (×6): qty 15

## 2016-07-31 NOTE — Progress Notes (Signed)
PROGRESS NOTE    Kara [name]  GNF:[id number] DOB: October 31, 1985 DOA: 07/30/2016 PCP: Duane Lope, MD   Brief Narrative:  Kara [name] is a 31 y.o. female with medical history significant of H/O alcohol abuse, alcohol pancreatitis, asthma with allergic rhinitis, HTN, s/p splenic vein thrombosis, hx of Prolonged QTC, polyneuropathy; who presents with four-day history of progressively worsening shortness of breath over the last 4 days. Last admitted to the hospital from 2/24 through 2/26 for acute respiratory failure secondary to community-acquired pneumonia.She states that she was discharged home on Levaquin and prednisone taper for which she completed medications as prescribed. Initially reported feeling better in subsequent weeks. However, with the recent weather changes patient noted changes in her breathing. Subsequently, noticed developing a cough that became productive with green thick sputum. Any activity and seem to worsen shortness of breath. Associated symptoms included night sweats and chest tightness. She also admits to smoking approximately 1 pack of cigarettes per day on average, but denies any alcohol use. Denies any relief of symptoms utilizing current home medications. She went to the doctor's office today where her oxygen saturations were noted to be in the 80s on room air. She was sent to the emergency department for further evaluation. Admitted for HCAP and Acute Respiratory Failure with Hypoxia.   Assessment & Plan:   Principal Problem:   Sepsis (HCC) Active Problems:   Chronic diastolic heart failure (HCC)   GERD (gastroesophageal reflux disease)   Benign essential HTN   Alcohol abuse, in remission   HCAP (healthcare-associated pneumonia)   Tobacco abuse   Respiratory failure with hypoxia (HCC)  Sepsis secondary to possible Healthcare associated pneumonia: Acute.  -Patient presents with tachycardic, tachypneic, with productive cough, and wheezing. WBC elevated at 15.4, but  lactic acid reassuring 1.4. Chest x-ray showing a right sided infiltrate concerning for residual versus recurrent bronchopneumonia. Patient received a fluid bolus and empiric antibiotics - Sepsis protocol was initiated; Was bolused 4 liters in ED and now on Maintenance Fluid  - Procalcitonin was 0.12 - Follow-up blood cultures (pending) and sputum cultures (showed Abundent WBC and Rare Gram + Cocci) - Check Respiratory Virus Panel  - C/w IVF with NS at 75 mL/hr - Continue Empiric antibiotics of IV Vancomycin and Cefepime -Added Hypertonic Saline Nebs, Flutter Valve, Incentive Spirometery and Guaifenesin 1200 mg po BID -WBC went from 15.4 -> 9.6 -Repeat CBC in AM -Repeat CXR in AM  Acute hypoxic respiratory failure 2/2 asthma exacerbation and PNA, improved - Continuous pulse oximetry with nasal cannula oxygen to keep O2 saturation greater than 92% - Solu-Medrol IV 60 mg every 8 hours - Duonebs scheduled q6h and prn shortness of breath/wheezing - Continue Montelukast 10 mg po qHS - Will need Walk Screen prior to D/C  Essential Hypertension and Hx of Prolonged QT - Continue Metoprolol Succinate 25 mg po Daily  Anxiety - Continue Sertraline 75 mg po Daily  Insomnia - Continue Quetiapine 100 mg po Daily qhS  H/O diastolic dysfunction grade 2: - Patient appears euvolemic at this time.  - Continue to monitor as fluids have been restarted at 75 mL/hr  Alcohol abuse, in remission - Continue Naltrexone 50 mg po Daily and Thiamine 100 mg po Daily  Tobacco abuse: - Patient reports smoking approximately 1 pack of cigarettes per day on average but is amenable to a nicotine patch while here in the hospital. - Counseled on the need of cessation of tobacco use - Nicotine patch 21 mg TD q24h  GERD - Pharmacy  substitution of Protonix 40 mg po BID  DVT prophylaxis: Enoxaparin 40 mg sq q24h Code Status: FULL CODE Family Communication: Discussed with husband at bedside Disposition  Plan: Likely Home at D/C  Consultants:   None  Procedures: None   Antimicrobials: Anti-infectives    Start     Dose/Rate Route Frequency Ordered Stop   07/31/16 1200  vancomycin (VANCOCIN) IVPB 1000 mg/200 mL premix     1,000 mg 200 mL/hr over 60 Minutes Intravenous Every 8 hours 07/30/16 2142     07/31/16 0600  ceFEPIme (MAXIPIME) 1 g in dextrose 5 % 50 mL IVPB     1 g 100 mL/hr over 30 Minutes Intravenous Every 8 hours 07/30/16 2143     07/30/16 2130  vancomycin (VANCOCIN) IVPB 1000 mg/200 mL premix     1,000 mg 200 mL/hr over 60 Minutes Intravenous  Once 07/30/16 2117     07/30/16 2045  levofloxacin (LEVAQUIN) IVPB 750 mg  Status:  Discontinued     750 mg 100 mL/hr over 90 Minutes Intravenous  Once 07/30/16 2031 07/30/16 2036   07/30/16 2045  ceFEPIme (MAXIPIME) 2 g in dextrose 5 % 50 mL IVPB     2 g 100 mL/hr over 30 Minutes Intravenous  Once 07/30/16 2038 07/30/16 2156   07/30/16 2045  vancomycin (VANCOCIN) IVPB 1000 mg/200 mL premix     1,000 mg 200 mL/hr over 60 Minutes Intravenous  Once 07/30/16 2038 07/30/16 2235     Subjective: Seen and examined and was feeling better but was coughing up thick sputum. SOB improved. No nasuea or vomiting. Denied CP  Objective: Vitals:   07/30/16 2358 07/31/16 0541 07/31/16 0756 07/31/16 0758  BP: 126/74 117/81    Pulse: (!) 115 94    Resp: 20 18    Temp: 98.5 F (36.9 C) 98 F (36.7 C)    TempSrc: Oral Oral    SpO2: 95% 99% 98% 98%  Weight: 109.9 kg (242 lb 4.6 oz)     Height:  (1.651 m)       Intake/Output Summary (Last 24 hours) at 07/31/16 1610 Last data filed at 07/31/16 0600  Gross per 24 hour  Intake             4250 ml  Output                0 ml  Net             4250 ml   Filed Weights   07/30/16 1948 07/30/16 2358  Weight: 106.6 kg (235 lb) 109.9 kg (242 lb 4.6 oz)   Examination: Physical Exam:  Constitutional: WN/WD obese Caucasian female, NAD and appears calm and comfortable sitting  bedside Eyes: Lids and conjunctivae normal, sclerae anicteric  ENMT: External Ears, Nose appear normal. Grossly normal hearing.  Neck: Appears normal, supple, no cervical masses, normal ROM, no appreciable thyromegaly, no visible JVD Respiratory: Severely dimimished to auscultation bilaterally with expiratory wheezing and some rhonchi; no rales or crackles. Normal respiratory effort and patient is not tachypenic. No accessory muscle use.  Cardiovascular: Tachycardic rate but regular rhythm, no murmurs / rubs / gallops. S1 and S2 auscultated.Trace lower extremity edema Abdomen: Soft, non-tender, non-distended. No masses palpated. No appreciable hepatosplenomegaly. Bowel sounds positive x4.  GU: Deferred. Musculoskeletal: No clubbing / cyanosis of digits/nails. No joint deformity upper and lower extremities. Skin: No rashes, lesions, ulcers on limited skin evaluation. No induration; Warm and dry.  Neurologic: CN 2-12 grossly intact with  no focal deficits. Sensation intact in all 4 Extremities. Romberg sign cerebellar reflexes not assessed.  Psychiatric: Normal judgment and insight. Alert and oriented x 3. Normal mood and appropriate affect.   Data Reviewed: I have personally reviewed following labs and imaging studies  CBC:  Recent Labs Lab 07/30/16 2050 07/31/16 0556  WBC 15.4* 9.6  NEUTROABS 11.9* 8.9*  HGB 12.6 11.4*  HCT 38.8 36.1  MCV 82.7 81.1  PLT 231 185   Basic Metabolic Panel:  Recent Labs Lab 07/30/16 2050 07/31/16 0346 07/31/16 0556  NA 136  --  138  K 3.9  --  4.2  CL 104  --  107  CO2 24  --  20*  GLUCOSE 135*  --  188*  BUN 10  --  10  CREATININE 0.69 0.70 0.60  CALCIUM 8.9  --  8.6*  MG  --   --  1.9  PHOS  --   --  2.0*   GFR: Estimated Creatinine Clearance: 125.8 mL/min (by C-G formula based on SCr of 0.6 mg/dL). Liver Function Tests:  Recent Labs Lab 07/30/16 2050  AST 26  ALT 14  ALKPHOS 83  BILITOT 1.0  PROT 7.4  ALBUMIN 3.8   No results  for input(s): LIPASE, AMYLASE in the last 168 hours. No results for input(s): AMMONIA in the last 168 hours. Coagulation Profile: No results for input(s): INR, PROTIME in the last 168 hours. Cardiac Enzymes: No results for input(s): CKTOTAL, CKMB, CKMBINDEX, TROPONINI in the last 168 hours. BNP (last 3 results) No results for input(s): PROBNP in the last 8760 hours. HbA1C: No results for input(s): HGBA1C in the last 72 hours. CBG: No results for input(s): GLUCAP in the last 168 hours. Lipid Profile: No results for input(s): CHOL, HDL, LDLCALC, TRIG, CHOLHDL, LDLDIRECT in the last 72 hours. Thyroid Function Tests: No results for input(s): TSH, T4TOTAL, FREET4, T3FREE, THYROIDAB in the last 72 hours. Anemia Panel: No results for input(s): VITAMINB12, FOLATE, FERRITIN, TIBC, IRON, RETICCTPCT in the last 72 hours. Sepsis Labs:  Recent Labs Lab 07/30/16 2053 07/30/16 2300 07/31/16 0346  PROCALCITON  --  0.12  --   LATICACIDVEN 1.40 1.5 1.9    No results found for this or any previous visit (from the past 240 hour(s)).   Radiology Studies: Dg Chest 2 View  Result Date: 07/30/2016 CLINICAL DATA:  Dyspnea and shortness of breath. Admitted to ICU for pneumonia 2 months prior. EXAM: CHEST  2 VIEW COMPARISON:  Chest radiograph June 12, 2016 FINDINGS: Cardiomediastinal silhouette is normal. Diffuse mild interstitial prominence with faint patchy airspace opacities similar to slightly improved. No pleural effusion. No pneumothorax. Soft tissue planes included osseous structures are normal, large body habitus. IMPRESSION: Interstitial and alveolar airspace opacities concerning for residual versus recurrent bronchopneumonia. Electronically Signed   By: Awilda Metro M.D.   On: 07/30/2016 20:22   Scheduled Meds: . ceFEPime (MAXIPIME) IV  1 g Intravenous Q8H  . enoxaparin (LOVENOX) injection  40 mg Subcutaneous QHS  . gabapentin  900 mg Oral BID  . ipratropium-albuterol  3 mL  Nebulization Once  . ipratropium-albuterol  3 mL Nebulization TID  . methylPREDNISolone (SOLU-MEDROL) injection  60 mg Intravenous Q8H  . metoprolol succinate  25 mg Oral Daily  . montelukast  10 mg Oral QHS  . naltrexone  50 mg Oral Daily  . nicotine  21 mg Transdermal Daily  . pantoprazole  40 mg Oral BID  . QUEtiapine  100 mg Oral QHS  .  sertraline  75 mg Oral Daily  . sodium chloride flush  3 mL Intravenous Q12H  . thiamine  100 mg Oral Daily  . vancomycin  1,000 mg Intravenous Once  . vancomycin  1,000 mg Intravenous Q8H   Continuous Infusions:   LOS: 1 day   Merlene Laughter, DO [facility] Pager [phone]  If 7PM-7AM, please contact night-coverage [email or web address] Password [email or web address] 07/31/2016, 8:22 AM

## 2016-08-01 ENCOUNTER — Inpatient Hospital Stay (HOSPITAL_COMMUNITY): Payer: BC Managed Care – PPO

## 2016-08-01 LAB — RESPIRATORY PANEL BY PCR
Adenovirus: NOT DETECTED
BORDETELLA PERTUSSIS-RVPCR: NOT DETECTED
Chlamydophila pneumoniae: NOT DETECTED
Coronavirus 229E: NOT DETECTED
Coronavirus HKU1: NOT DETECTED
Coronavirus NL63: NOT DETECTED
Coronavirus OC43: NOT DETECTED
INFLUENZA B-RVPPCR: NOT DETECTED
Influenza A: NOT DETECTED
METAPNEUMOVIRUS-RVPPCR: NOT DETECTED
Mycoplasma pneumoniae: NOT DETECTED
PARAINFLUENZA VIRUS 2-RVPPCR: NOT DETECTED
PARAINFLUENZA VIRUS 3-RVPPCR: NOT DETECTED
Parainfluenza Virus 1: NOT DETECTED
Parainfluenza Virus 4: NOT DETECTED
RESPIRATORY SYNCYTIAL VIRUS-RVPPCR: NOT DETECTED
RHINOVIRUS / ENTEROVIRUS - RVPPCR: NOT DETECTED

## 2016-08-01 LAB — COMPREHENSIVE METABOLIC PANEL
ALT: 13 U/L — ABNORMAL LOW (ref 14–54)
AST: 19 U/L (ref 15–41)
Albumin: 3.1 g/dL — ABNORMAL LOW (ref 3.5–5.0)
Alkaline Phosphatase: 71 U/L (ref 38–126)
Anion gap: 10 (ref 5–15)
BILIRUBIN TOTAL: 0.4 mg/dL (ref 0.3–1.2)
BUN: 15 mg/dL (ref 6–20)
CO2: 22 mmol/L (ref 22–32)
CREATININE: 0.65 mg/dL (ref 0.44–1.00)
Calcium: 8.6 mg/dL — ABNORMAL LOW (ref 8.9–10.3)
Chloride: 106 mmol/L (ref 101–111)
Glucose, Bld: 283 mg/dL — ABNORMAL HIGH (ref 65–99)
POTASSIUM: 3.9 mmol/L (ref 3.5–5.1)
Sodium: 138 mmol/L (ref 135–145)
TOTAL PROTEIN: 6.4 g/dL — AB (ref 6.5–8.1)

## 2016-08-01 LAB — CBC WITH DIFFERENTIAL/PLATELET
BASOS ABS: 0 10*3/uL (ref 0.0–0.1)
Basophils Relative: 0 %
EOS PCT: 0 %
Eosinophils Absolute: 0 10*3/uL (ref 0.0–0.7)
HEMATOCRIT: 35.5 % — AB (ref 36.0–46.0)
Hemoglobin: 11.3 g/dL — ABNORMAL LOW (ref 12.0–15.0)
LYMPHS ABS: 0.9 10*3/uL (ref 0.7–4.0)
LYMPHS PCT: 5 %
MCH: 26.8 pg (ref 26.0–34.0)
MCHC: 31.8 g/dL (ref 30.0–36.0)
MCV: 84.1 fL (ref 78.0–100.0)
MONO ABS: 0.4 10*3/uL (ref 0.1–1.0)
MONOS PCT: 2 %
Neutro Abs: 15.8 10*3/uL — ABNORMAL HIGH (ref 1.7–7.7)
Neutrophils Relative %: 93 %
Platelets: 230 10*3/uL (ref 150–400)
RBC: 4.22 MIL/uL (ref 3.87–5.11)
RDW: 14.4 % (ref 11.5–15.5)
WBC: 17 10*3/uL — AB (ref 4.0–10.5)

## 2016-08-01 LAB — PHOSPHORUS: PHOSPHORUS: 2.6 mg/dL (ref 2.5–4.6)

## 2016-08-01 LAB — STREP PNEUMONIAE URINARY ANTIGEN: STREP PNEUMO URINARY ANTIGEN: NEGATIVE

## 2016-08-01 LAB — GLUCOSE, CAPILLARY
Glucose-Capillary: 175 mg/dL — ABNORMAL HIGH (ref 65–99)
Glucose-Capillary: 190 mg/dL — ABNORMAL HIGH (ref 65–99)

## 2016-08-01 LAB — MAGNESIUM: MAGNESIUM: 1.7 mg/dL (ref 1.7–2.4)

## 2016-08-01 MED ORDER — INSULIN ASPART 100 UNIT/ML ~~LOC~~ SOLN
0.0000 [IU] | Freq: Three times a day (TID) | SUBCUTANEOUS | Status: DC
  Administered 2016-08-01: 2 [IU] via SUBCUTANEOUS
  Administered 2016-08-02: 1 [IU] via SUBCUTANEOUS
  Administered 2016-08-02: 2 [IU] via SUBCUTANEOUS

## 2016-08-01 NOTE — Progress Notes (Signed)
PROGRESS NOTE    Kara [name]  NFA:[id number] DOB: Mar 02, 1986 DOA: 07/30/2016 PCP: Duane Lope, MD   Brief Narrative:  Kara [name] is a 31 y.o. female with medical history significant of H/O alcohol abuse, alcohol pancreatitis, asthma with allergic rhinitis, HTN, s/p splenic vein thrombosis, hx of Prolonged QTC, polyneuropathy; who presents with four-day history of progressively worsening shortness of breath over the last 4 days. Last admitted to the hospital from 2/24 through 2/26 for acute respiratory failure secondary to community-acquired pneumonia.She states that she was discharged home on Levaquin and prednisone taper for which she completed medications as prescribed. Initially reported feeling better in subsequent weeks. However, with the recent weather changes patient noted changes in her breathing. Subsequently, noticed developing a cough that became productive with green thick sputum. Any activity and seem to worsen shortness of breath. Associated symptoms included night sweats and chest tightness. She also admits to smoking approximately 1 pack of cigarettes per day on average, but denies any alcohol use. Denies any relief of symptoms utilizing current home medications. She went to the doctor's office today where her oxygen saturations were noted to be in the 80s on room air. She was sent to the emergency department for further evaluation. Admitted for HCAP and Acute Respiratory Failure with Hypoxia.   Assessment & Plan:   Principal Problem:   Sepsis (HCC) Active Problems:   Chronic diastolic heart failure (HCC)   GERD (gastroesophageal reflux disease)   Benign essential HTN   Alcohol abuse, in remission   HCAP (healthcare-associated pneumonia)   Tobacco abuse   Respiratory failure with hypoxia (HCC)  Sepsis secondary to possible Healthcare associated pneumonia: Acute. Improving -Patient presents with tachycardic, tachypneic, with productive cough, and wheezing. WBC elevated at  15.4, but lactic acid reassuring 1.4. Chest x-ray showing a right sided infiltrate concerning for residual versus recurrent bronchopneumonia. Patient received a fluid bolus and empiric antibiotics - Sepsis protocol was initiated; Was bolused 4 liters in ED and now on Maintenance Fluid at 75 mL/hr - Procalcitonin was 0.12 - Follow-up blood cultures (pending) and sputum cultures (showed Abundent WBC and Rare Gram + Cocci) with Cx Pending - Check Respiratory Virus Panel (Pending) - C/w IVF with NS at 75 mL/hr - Continue Empiric antibiotics of IV Vancomycin and Cefepime -C/w Hypertonic Saline Nebs, Flutter Valve, Incentive Spirometery and Guaifenesin 1200 mg po BID -WBC went from 15.4 -> 9.6 -> 17.0 (Likely from Steroid Demargination -Repeat CXR this AM showed slightly increased bilateral interstitial and probable airspace opacities. -Repeat CBC in AM  Acute Hypoxic Respiratory Failure 2/2 Asthma exacerbation and PNA, improved - Continuous pulse oximetry with nasal cannula oxygen to keep O2 saturation greater than 92% - Solu-Medrol IV 60 mg every 8 hours - Duonebs scheduled q6h and prn shortness of breath/wheezing - Continue Montelukast 10 mg po qHS - Will need Walk Screen prior to D/C  Essential Hypertension and Hx of Prolonged QT - Continue Metoprolol Succinate 25 mg po Daily  Anxiety - Continue Sertraline 75 mg po Daily  Insomnia - Continue Quetiapine 100 mg po Daily qhS  H/O of Diastolic Dysfunction Grade 2 with recent ECHO normal - Recent ECHO 06/13/16 showed EF of 60-65% and Left Ventricular Diastolic Function Parameters were normal - Patient appears euvolemic at this time.  - Continue to monitor as fluids have been restarted at 75 mL/hr  Alcohol abuse, in remission - Continue Naltrexone 50 mg po Daily and Thiamine 100 mg po Daily  Tobacco abuse: - Patient reports smoking approximately  1 pack of cigarettes per day on average but is amenable to a nicotine patch while here  in the hospital. - Counseled on the need of cessation of tobacco use - C/w Nicotine patch 21 mg TD q24h  GERD - Pharmacy substitution of Protonix 40 mg po BID  Leukocytosis -WBC went from 9.6 -> 17.0 -Likely from IV Steroid Demargination -Repeat CBC in AM  Hyperglycemia -In the setting of IV Steroid Use -Check HbA1c -Will place on Sensitive Novolog SSI AC and monitor CBG's AC HS  Knee Pain -C/w Meloxicam 15 mg po Daily  DVT prophylaxis: Enoxaparin 40 mg sq q24h Code Status: FULL CODE Family Communication: No family at bedside Disposition Plan: Likely Home at D/C  Consultants:   None  Procedures: None   Antimicrobials: Anti-infectives    Start     Dose/Rate Route Frequency Ordered Stop   07/31/16 1200  vancomycin (VANCOCIN) IVPB 1000 mg/200 mL premix     1,000 mg 200 mL/hr over 60 Minutes Intravenous Every 8 hours 07/30/16 2142     07/31/16 0600  ceFEPIme (MAXIPIME) 1 g in dextrose 5 % 50 mL IVPB     1 g 100 mL/hr over 30 Minutes Intravenous Every 8 hours 07/30/16 2143     07/30/16 2130  vancomycin (VANCOCIN) IVPB 1000 mg/200 mL premix     1,000 mg 200 mL/hr over 60 Minutes Intravenous  Once 07/30/16 2117     07/30/16 2045  levofloxacin (LEVAQUIN) IVPB 750 mg  Status:  Discontinued     750 mg 100 mL/hr over 90 Minutes Intravenous  Once 07/30/16 2031 07/30/16 2036   07/30/16 2045  ceFEPIme (MAXIPIME) 2 g in dextrose 5 % 50 mL IVPB     2 g 100 mL/hr over 30 Minutes Intravenous  Once 07/30/16 2038 07/30/16 2156   07/30/16 2045  vancomycin (VANCOCIN) IVPB 1000 mg/200 mL premix     1,000 mg 200 mL/hr over 60 Minutes Intravenous  Once 07/30/16 2038 07/30/16 2235     Subjective: Seen and examined and states she was breathing better and coughing up sputum better. Still had some wheezing. No nausea or vomiting. Not wearing any more O2 via [place].   Objective: Vitals:   07/31/16 2140 08/01/16 0534 08/01/16 0737 08/01/16 0739  BP: 128/68 (!) 111/56    Pulse: 97 85      Resp: 18 16    Temp: 98.7 F (37.1 C) 97.8 F (36.6 C)    TempSrc: Oral Oral    SpO2: 97% 98% 97% 97%  Weight:      Height:        Intake/Output Summary (Last 24 hours) at 08/01/16 1049 Last data filed at 08/01/16 0600  Gross per 24 hour  Intake           4202.5 ml  Output              451 ml  Net           3751.5 ml   Filed Weights   07/30/16 1948 07/30/16 2358  Weight: 106.6 kg (235 lb) 109.9 kg (242 lb 4.6 oz)   Examination: Physical Exam:  Constitutional: WN/WD obese Caucasian female, NAD and appears calm and comfortable sitting bedside Eyes: Lids and conjunctivae normal, sclerae anicteric  ENMT: External Ears, Nose appear normal. Grossly normal hearing.  Neck: Appears normal, supple, no cervical masses, normal ROM, no appreciable thyromegaly, no visible JVD Respiratory: Dimimished to auscultation bilaterally with some expiratory wheezing and some rhonchi; no rales  or crackles. Normal respiratory effort and patient is not tachypenic. No accessory muscle use. Not wearing supplemental O2 via [place].  Cardiovascular: Tachycardic rate but regular rhythm, no murmurs / rubs / gallops. S1 and S2 auscultated.Trace lower extremity edema Abdomen: Soft, non-tender, non-distended. No masses palpated. No appreciable hepatosplenomegaly. Bowel sounds positive x4.  GU: Deferred. Musculoskeletal: No clubbing / cyanosis of digits/nails. No joint deformity upper and lower extremities. Skin: No rashes, lesions, ulcers on limited skin evaluation. No induration; Warm and dry.  Neurologic: CN 2-12 grossly intact with no focal deficits. Sensation intact in all 4 Extremities. Romberg sign cerebellar reflexes not assessed.  Psychiatric: Normal judgment and insight. Alert and oriented x 3. Normal mood and appropriate affect.   Data Reviewed: I have personally reviewed following labs and imaging studies  CBC:  Recent Labs Lab 07/30/16 2050 07/31/16 0556 08/01/16 0534  WBC 15.4* 9.6 17.0*   NEUTROABS 11.9* 8.9* 15.8*  HGB 12.6 11.4* 11.3*  HCT 38.8 36.1 35.5*  MCV 82.7 81.1 84.1  PLT 231 185 230   Basic Metabolic Panel:  Recent Labs Lab 07/30/16 2050 07/31/16 0346 07/31/16 0556 08/01/16 0534  NA 136  --  138 138  K 3.9  --  4.2 3.9  CL 104  --  107 106  CO2 24  --  20* 22  GLUCOSE 135*  --  188* 283*  BUN 10  --  10 15  CREATININE 0.69 0.70 0.60 0.65  CALCIUM 8.9  --  8.6* 8.6*  MG  --   --  1.9 1.7  PHOS  --   --  2.0* 2.6   GFR: Estimated Creatinine Clearance: 125.8 mL/min (by C-G formula based on SCr of 0.65 mg/dL). Liver Function Tests:  Recent Labs Lab 07/30/16 2050 08/01/16 0534  AST 26 19  ALT 14 13*  ALKPHOS 83 71  BILITOT 1.0 0.4  PROT 7.4 6.4*  ALBUMIN 3.8 3.1*   No results for input(s): LIPASE, AMYLASE in the last 168 hours. No results for input(s): AMMONIA in the last 168 hours. Coagulation Profile: No results for input(s): INR, PROTIME in the last 168 hours. Cardiac Enzymes: No results for input(s): CKTOTAL, CKMB, CKMBINDEX, TROPONINI in the last 168 hours. BNP (last 3 results) No results for input(s): PROBNP in the last 8760 hours. HbA1C: No results for input(s): HGBA1C in the last 72 hours. CBG: No results for input(s): GLUCAP in the last 168 hours. Lipid Profile: No results for input(s): CHOL, HDL, LDLCALC, TRIG, CHOLHDL, LDLDIRECT in the last 72 hours. Thyroid Function Tests: No results for input(s): TSH, T4TOTAL, FREET4, T3FREE, THYROIDAB in the last 72 hours. Anemia Panel: No results for input(s): VITAMINB12, FOLATE, FERRITIN, TIBC, IRON, RETICCTPCT in the last 72 hours. Sepsis Labs:  Recent Labs Lab 07/30/16 2053 07/30/16 2300 07/31/16 0346  PROCALCITON  --  0.12  --   LATICACIDVEN 1.40 1.5 1.9    Recent Results (from the past 240 hour(s))  Culture, respiratory (NON-Expectorated)     Status: None (Preliminary result)   Collection Time: 07/30/16 11:07 PM  Result Value Ref Range Status   Specimen Description  SPUTUM  Final   Special Requests NONE  Final   Gram Stain   Final    ABUNDANT WBC PRESENT, PREDOMINANTLY PMN RARE GRAM POSITIVE COCCI IN CLUSTERS    Culture   Final    CULTURE REINCUBATED FOR BETTER GROWTH Performed at [facility], [address], [place], [place] [address]    Report Status PENDING  Incomplete    Radiology Studies: Dg Chest 2 View  Result Date: 07/30/2016 CLINICAL DATA:  Dyspnea and shortness of breath. Admitted to ICU for pneumonia 2 months prior. EXAM: CHEST  2 VIEW COMPARISON:  Chest radiograph June 12, 2016 FINDINGS: Cardiomediastinal silhouette is normal. Diffuse mild interstitial prominence with faint patchy airspace opacities similar to slightly improved. No pleural effusion. No pneumothorax. Soft tissue planes included osseous structures are normal, large body habitus. IMPRESSION: Interstitial and alveolar airspace opacities concerning for residual versus recurrent bronchopneumonia. Electronically Signed   By: Awilda Metro M.D.   On: 07/30/2016 20:22   Dg Chest Port 1 View  Result Date: 08/01/2016 CLINICAL DATA:  Pneumonia. EXAM: PORTABLE CHEST 1 VIEW COMPARISON:  07/30/2016 and prior exams FINDINGS: Cardiomediastinal silhouette is unchanged. Bilateral interstitial and probable airspace opacities have slightly increased. There is no evidence of pleural effusion or pneumothorax. No other interval change noted. IMPRESSION: Slightly increased bilateral interstitial and probable airspace opacities. Electronically Signed   By: Harmon Pier M.D.   On: 08/01/2016 07:46   Scheduled Meds: . ceFEPime (MAXIPIME) IV  1 g Intravenous Q8H  . chlorhexidine  15 mL Mouth/Throat BID  . enoxaparin (LOVENOX) injection  40 mg Subcutaneous QHS  . gabapentin  900 mg Oral BID  . guaiFENesin  1,200 mg Oral BID  . ipratropium-albuterol  3 mL Nebulization Once  . ipratropium-albuterol  3 mL Nebulization TID  . meloxicam  15 mg Oral Daily  . methylPREDNISolone (SOLU-MEDROL)  injection  60 mg Intravenous Q8H  . metoprolol succinate  25 mg Oral Daily  . montelukast  10 mg Oral QHS  . naltrexone  50 mg Oral Daily  . nicotine  21 mg Transdermal Daily  . pantoprazole  40 mg Oral BID  . QUEtiapine  100 mg Oral QHS  . sertraline  75 mg Oral Daily  . sodium chloride flush  3 mL Intravenous Q12H  . sodium chloride HYPERTONIC  4 mL Nebulization Daily  . thiamine  100 mg Oral Daily  . vancomycin  1,000 mg Intravenous Once  . vancomycin  1,000 mg Intravenous Q8H   Continuous Infusions: . sodium chloride 75 mL/hr at 08/01/16 0531    LOS: 2 days   Merlene Laughter, DO [facility] Pager [phone]  If 7PM-7AM, please contact night-coverage [email or web address] Password [facility] 08/01/2016, 10:49 AM

## 2016-08-02 LAB — COMPREHENSIVE METABOLIC PANEL
ALBUMIN: 3 g/dL — AB (ref 3.5–5.0)
ALT: 14 U/L (ref 14–54)
AST: 12 U/L — ABNORMAL LOW (ref 15–41)
Alkaline Phosphatase: 64 U/L (ref 38–126)
Anion gap: 7 (ref 5–15)
BILIRUBIN TOTAL: 0.2 mg/dL — AB (ref 0.3–1.2)
BUN: 16 mg/dL (ref 6–20)
CHLORIDE: 107 mmol/L (ref 101–111)
CO2: 24 mmol/L (ref 22–32)
Calcium: 8.5 mg/dL — ABNORMAL LOW (ref 8.9–10.3)
Creatinine, Ser: 0.61 mg/dL (ref 0.44–1.00)
GFR calc Af Amer: >60 mL/min (ref >60)
Glucose, Bld: 181 mg/dL — ABNORMAL HIGH (ref 65–99)
POTASSIUM: 4.1 mmol/L (ref 3.5–5.1)
Sodium: 138 mmol/L (ref 135–145)
TOTAL PROTEIN: 6.1 g/dL — AB (ref 6.5–8.1)

## 2016-08-02 LAB — CBC WITH DIFFERENTIAL/PLATELET
BASOS ABS: 0 10*3/uL (ref 0.0–0.1)
BASOS PCT: 0 %
EOS PCT: 0 %
Eosinophils Absolute: 0 10*3/uL (ref 0.0–0.7)
HEMATOCRIT: 35.9 % — AB (ref 36.0–46.0)
Hemoglobin: 11.2 g/dL — ABNORMAL LOW (ref 12.0–15.0)
Lymphocytes Relative: 10 %
Lymphs Abs: 1.3 10*3/uL (ref 0.7–4.0)
MCH: 25.7 pg — ABNORMAL LOW (ref 26.0–34.0)
MCHC: 31.2 g/dL (ref 30.0–36.0)
MCV: 82.5 fL (ref 78.0–100.0)
MONO ABS: 0.7 10*3/uL (ref 0.1–1.0)
MONOS PCT: 5 %
NEUTROS ABS: 10.6 10*3/uL — AB (ref 1.7–7.7)
Neutrophils Relative %: 85 %
PLATELETS: 221 10*3/uL (ref 150–400)
RBC: 4.35 MIL/uL (ref 3.87–5.11)
RDW: 14.2 % (ref 11.5–15.5)
WBC: 12.6 10*3/uL — ABNORMAL HIGH (ref 4.0–10.5)

## 2016-08-02 LAB — HEMOGLOBIN A1C
Hgb A1c MFr Bld: 5.6 % (ref 4.8–5.6)
Mean Plasma Glucose: 114 mg/dL

## 2016-08-02 LAB — CULTURE, RESPIRATORY

## 2016-08-02 LAB — GLUCOSE, CAPILLARY
GLUCOSE-CAPILLARY: 134 mg/dL — AB (ref 65–99)
GLUCOSE-CAPILLARY: 177 mg/dL — AB (ref 65–99)

## 2016-08-02 LAB — CULTURE, RESPIRATORY W GRAM STAIN: Culture: NORMAL

## 2016-08-02 LAB — MAGNESIUM: MAGNESIUM: 1.8 mg/dL (ref 1.7–2.4)

## 2016-08-02 LAB — PHOSPHORUS: Phosphorus: 2.4 mg/dL — ABNORMAL LOW (ref 2.5–4.6)

## 2016-08-02 MED ORDER — NICOTINE 21 MG/24HR TD PT24: 21.0000 mg | MEDICATED_PATCH | Freq: Every day | TRANSDERMAL | 0 refills | Status: DC

## 2016-08-02 MED ORDER — LEVOFLOXACIN 750 MG PO TABS: 750.0000 mg | ORAL_TABLET | Freq: Every day | ORAL | Status: DC

## 2016-08-02 MED ORDER — GUAIFENESIN ER 600 MG PO TB12: 1200.0000 mg | ORAL_TABLET | Freq: Two times a day (BID) | ORAL | 0 refills | Status: DC

## 2016-08-02 MED ORDER — PREDNISONE 10 MG (21) PO TBPK: ORAL_TABLET | ORAL | 0 refills | Status: DC

## 2016-08-02 MED ORDER — LEVOFLOXACIN IN D5W 750 MG/150ML IV SOLN
750.0000 mg | Freq: Once | INTRAVENOUS | Status: AC
  Administered 2016-08-02: 750 mg via INTRAVENOUS
  Filled 2016-08-02: qty 150

## 2016-08-02 MED ORDER — LEVOFLOXACIN 750 MG PO TABS: 750.0000 mg | ORAL_TABLET | Freq: Every day | ORAL | 0 refills | Status: DC

## 2016-08-02 MED ORDER — METHYLPREDNISOLONE SODIUM SUCC 40 MG IJ SOLR: 40.0000 mg | Freq: Two times a day (BID) | INTRAMUSCULAR | Status: DC

## 2016-08-02 NOTE — Care Management Note (Signed)
Case Management Note  Patient Details  Name: Kara [name] MRN: [id number] Date of Birth: 05/04/1985  Subjective/Objective:                    Action/Plan:dc home.   Expected Discharge Date:   (unknown)               Expected Discharge Plan:  Home/Self Care  In-House Referral:     Discharge planning Services  CM Consult  Post Acute Care Choice:    Choice offered to:     DME Arranged:    DME Agency:     HH Arranged:    HH Agency:     Status of Service:  Completed, signed off  If discussed at [employer] of Stay Meetings, dates discussed:    Additional Comments:  Lanier Clam, RN 08/02/2016, 3:16 PM

## 2016-08-02 NOTE — Care Management Note (Signed)
Case Management Note  Patient Details  Name: Kara [name] MRN: [id number] Date of Birth: 1985/12/28  Subjective/Objective:30 y/o f admitted w/Sepsis. From home. Noted on 02-if home 02 needed can arrange w/documented progress note 02 sats-if qualifies,then need home 02 order.                     Action/Plan:d/c plan home.   Expected Discharge Date:   (unknown)               Expected Discharge Plan:  Home/Self Care  In-House Referral:     Discharge planning Services  CM Consult  Post Acute Care Choice:    Choice offered to:     DME Arranged:    DME Agency:     HH Arranged:    HH Agency:     Status of Service:  In process, will continue to follow  If discussed at Long Length of Stay Meetings, dates discussed:    Additional Comments:  Lanier Clam, RN 08/02/2016, 11:50 AM

## 2016-08-02 NOTE — Discharge Summary (Signed)
Physician Discharge Summary  [name] WJX:[id number] DOB: 1985/09/11 DOA: 07/30/2016  PCP: Duane Lope, MD  Admit date: 07/30/2016 Discharge date: 08/02/2016  Admitted From: Home Disposition:  Home  Recommendations for Outpatient Follow-up:  1. Follow up with PCP in 1-2 weeks; Has appointment 08/03/16 2. Please obtain CMP/CBC in one week 3. Repeat CXR in 4-6 weeks to ensure Pneumonia resolution 4. Please follow up on the following pending results:  Home Health: No Equipment/Devices: None  Discharge Condition: Stable CODE STATUS: FULL CODE Diet recommendation: Heart Healthy   Brief/Interim Summary: Kara [name] a 31 y.o.femalewith medical history significant of H/O alcohol abuse,alcohol pancreatitis, asthma with allergic rhinitis, HTN, s/p splenic vein thrombosis, hx of Prolonged QTC, polyneuropathy; who presents with four-day history of progressively worsening shortness of breath over the last 4 days. Last admitted to the hospital from 2/24 through 2/26 for acute respiratory failure secondary to community-acquired pneumonia.She states that she was discharged home on Levaquin and prednisone taper for which she completed medications as prescribed. Initially reported feeling better in subsequent weeks. However, with the recent weather changes patient noted changes in her breathing. Subsequently,noticed developing a cough that became productive with green thick sputum. Any activity and seem to worsen shortness of breath. Associated symptoms included night sweats andchest tightness. She also admits to smoking approximately 1 pack of cigarettes per day on average, but denies any alcohol use. Denies any relief of symptoms utilizing current home medications. She went to the doctor's office today where her oxygen saturations were noted to be in the 80s on room air. She was sent to the emergency department for further evaluation. Admitted for HCAP and Acute Respiratory Failure with Hypoxia. She  steadily improved and was weaned off of O2. Had a walk screen prior to D/C and did not require O2. Patient improved and felt tremendously better. At this time she was deemed medically stable and is to follow up with PCP tomorrow.   Discharge Diagnoses:  Principal Problem:   Sepsis (HCC) Active Problems:   Chronic diastolic heart failure (HCC)   GERD (gastroesophageal reflux disease)   Benign essential HTN   Alcohol abuse, in remission   HCAP (healthcare-associated pneumonia)   Tobacco abuse   Respiratory failure with hypoxia (HCC)  Sepsis secondary topossible Healthcare associated pneumonia: Acute. Improving -Patient presents with tachycardic,tachypneic,with productive cough,and wheezing. WBC elevated at 15.4, but lactic acid reassuring 1.4. Chest x-ray showing a right sided infiltrate concerning for residual versus recurrent bronchopneumonia. Patient received a fluid bolus and empiric antibiotics - Sepsis protocol was initiated; Was bolused 4 liters in ED and placed on Maintenance Fluid at 75 mL/hr - Procalcitonin was 0.12 - Follow-up blood cultures (NGTD at 2 days) and sputum cultures (showed Abundent WBC and Rare Gram + Cocci) with Cx Consistent with Normal Respiratory Flora - Checked Respiratory Virus Panel and Negative - IVF with NS at 75 mL/hr was given and encourage po hydration - Empiric antibiotics of IV Vancomycin and Cefepime changed to Levaquin -C/w Hypertonic Saline Nebs while hospitalized  -C/w Flutter Valve, Incentive Spirometery and Guaifenesin 1200 mg po BID as an outpatient -WBC went from 15.4 -> 9.6 -> 17.0 -> 12.6 (Likely from Steroid Demargination -Repeat CXR this AM showed slightly increased bilateral interstitial and probable airspace opacities. -Repeat CBC in AM at PCP  Acute Hypoxic Respiratory Failure 2/2Asthma exacerbation and PNA, improved - Continuous pulse oximetry with nasal cannulaoxygen to keep O2 saturation greater than 92% - Solu-Medrol IV 60  mg every 8 hours now changed to  Sterapred Taper - C/w Home Duonebs prn shortness of breath/wheezing - Continue Montelukast 10 mg po qHS - Walk Screen prior to D/C showed she required no O2  Essential Hypertension and Hx of Prolonged QT - Continue Metoprolol Succinate 25 mg po Daily  Anxiety - Continue Sertraline 75 mg po Daily  Insomnia - Continue Quetiapine 100 mg po Daily qhS  H/Oof Diastolic Dysfunction Grade 2 with recent ECHO normal - Recent ECHO 06/13/16 showed EF of 60-65% and Left Ventricular Diastolic Function Parameters were normal - Patient appears euvolemic at this time.  - Continue to monitor and follow up as an outpatient  Alcohol abuse, in remission -Continue Naltrexone 50 mg po Daily and Thiamine 100 mg po Daily  Tobacco abuse: - Patient reports smoking approximately 1 pack of cigarettes per day on average but is amenable to a nicotine patch while here in the hospital. - Counseled on theneed ofcessation of tobacco use - C/w Nicotine patch 21 mg TD q24h at D/C  GERD - Resume Home PPI  Leukocytosis -WBC went from 9.6 -> 17.0 -> 12.6 -Likely from IV Steroid Demargination -Repeat CBC in AM at PCP's office  Hyperglycemia -In the setting of IV Steroid Use -HbA1c was 5.6 -Was placed on Sensitive Novolog SSI AC and monitored CBG's AC HS -Follow up with PCP as an outpatient  Knee Pain -C/w Meloxicam 15 mg po Daily prn  Discharge Instructions  Discharge Instructions    Call MD for:  difficulty breathing, headache or visual disturbances    Complete by:  As directed    Call MD for:  extreme fatigue    Complete by:  As directed    Call MD for:  hives    Complete by:  As directed    Call MD for:  persistant dizziness or light-headedness    Complete by:  As directed    Call MD for:  persistant nausea and vomiting    Complete by:  As directed    Call MD for:  severe uncontrolled pain    Complete by:  As directed    Call MD for:  temperature  >100.4    Complete by:  As directed    Diet - low sodium heart healthy    Complete by:  As directed    Discharge instructions    Complete by:  As directed    Follow up with PCP (has an appointment 4/17) and Take all medications as prescribed. If symptoms change or worsen please return to the ED for evaluation.   Increase activity slowly    Complete by:  As directed      Allergies as of 08/02/2016      Reactions   Peanuts [peanut Oil] Hives, Itching      Medication List    STOP taking these medications   oxyCODONE-acetaminophen 5-325 MG tablet Commonly known as:  PERCOCET   predniSONE 10 MG tablet Commonly known as:  DELTASONE Replaced by:  predniSONE 10 MG (21) Tbpk tablet     TAKE these medications   albuterol 108 (90 Base) MCG/ACT inhaler Commonly known as:  PROVENTIL HFA;VENTOLIN HFA Inhale 2 puffs into the lungs every 6 (six) hours as needed for wheezing or shortness of breath.   budesonide-formoterol 160-4.5 MCG/ACT inhaler Commonly known as:  SYMBICORT Inhale 2 puffs into the lungs 2 (two) times daily.   EPIPEN 2-PAK 0.3 mg/0.3 mL Soaj injection Generic drug:  EPINEPHrine Inject 0.3 mg into the muscle once as needed (for severe allergic reaction).  gabapentin 300 MG capsule Commonly known as:  NEURONTIN Take 900 mg by mouth 2 (two) times daily.   guaiFENesin 600 MG 12 hr tablet Commonly known as:  MUCINEX Take 2 tablets (1,200 mg total) by mouth 2 (two) times daily.   ipratropium-albuterol 0.5-2.5 (3) MG/3ML Soln Commonly known as:  DUONEB USE 1 VIAL PER NEBULIZER EVERY 6 HOURS  FOR 30 DAYS   levofloxacin 750 MG tablet Commonly known as:  LEVAQUIN Take 1 tablet (750 mg total) by mouth daily. Start taking on:  08/03/2016   Melatonin 10 MG Tabs Take 10 mg by mouth at bedtime.   meloxicam 15 MG tablet Commonly known as:  MOBIC Take 15 mg by mouth daily.   metoprolol succinate 25 MG 24 hr tablet Commonly known as:  TOPROL-XL Take 1 tablet (25 mg  total) by mouth daily. *Please call and schedule a one year follow up appointment*   montelukast 10 MG tablet Commonly known as:  SINGULAIR TAKE 1 TABLET BY MOUTH DAILY What changed:  See the new instructions.   multivitamin with minerals tablet Take 1 tablet by mouth daily.   naltrexone 50 MG tablet Commonly known as:  DEPADE Take 50 mg by mouth daily.   nicotine 21 mg/24hr patch Commonly known as:  NICODERM CQ - dosed in mg/24 hours Place 1 patch (21 mg total) onto the skin daily. Start taking on:  08/03/2016   omeprazole 40 MG capsule Commonly known as:  PRILOSEC TAKE 1 CAPSULE(40 MG) BY MOUTH DAILY   predniSONE 10 MG (21) Tbpk tablet Commonly known as:  STERAPRED UNI-PAK 21 TAB Take 6 Tablets Day 1, 5 Tablets Day 2, 4 Tablets Day 3, 3 Tablets Day 4, 2 Tablets Day 5, 1 Tablet Day 6 and Stop Replaces:  predniSONE 10 MG tablet   QUEtiapine 50 MG tablet Commonly known as:  SEROQUEL Take 100 mg by mouth at bedtime.   sertraline 50 MG tablet Commonly known as:  ZOLOFT Take 75 mg by mouth daily.   thiamine 100 MG tablet Take 100 mg by mouth daily.   VITAMIN D PO Take 1 tablet by mouth at bedtime.       Allergies  Allergen Reactions  . Peanuts [Peanut Oil] Hives and Itching   Consultations:  None  Procedures/Studies: Dg Chest 2 View  Result Date: 07/30/2016 CLINICAL DATA:  Dyspnea and shortness of breath. Admitted to ICU for pneumonia 2 months prior. EXAM: CHEST  2 VIEW COMPARISON:  Chest radiograph June 12, 2016 FINDINGS: Cardiomediastinal silhouette is normal. Diffuse mild interstitial prominence with faint patchy airspace opacities similar to slightly improved. No pleural effusion. No pneumothorax. Soft tissue planes included osseous structures are normal, large body habitus. IMPRESSION: Interstitial and alveolar airspace opacities concerning for residual versus recurrent bronchopneumonia. Electronically Signed   By: Awilda Metro M.D.   On: 07/30/2016  20:22   Dg Chest Port 1 View  Result Date: 08/01/2016 CLINICAL DATA:  Pneumonia. EXAM: PORTABLE CHEST 1 VIEW COMPARISON:  07/30/2016 and prior exams FINDINGS: Cardiomediastinal silhouette is unchanged. Bilateral interstitial and probable airspace opacities have slightly increased. There is no evidence of pleural effusion or pneumothorax. No other interval change noted. IMPRESSION: Slightly increased bilateral interstitial and probable airspace opacities. Electronically Signed   By: Harmon Pier M.D.   On: 08/01/2016 07:46     Subjective: Seen and examined and was feeling better. Able to expectorate and cough up sputum and thinks it is thinner. No nausea or vomiting and ready to go home.  Discharge Exam: Vitals:   08/02/16 0329 08/02/16 1409  BP: 106/82 139/87  Pulse: 98 90  Resp: 18 20  Temp: 98 F (36.7 C) 98.3 F (36.8 C)   Vitals:   08/02/16 0329 08/02/16 0902 08/02/16 1409 08/02/16 1431  BP: 106/82  139/87   Pulse: 98  90   Resp: 18  20   Temp: 98 F (36.7 C)  98.3 F (36.8 C)   TempSrc: Oral  Oral   SpO2: 97% 98% 93% 97%  Weight:      Height:       General: Pt is alert, awake, not in acute distress Cardiovascular: RRR, S1/S2 +, no rubs, no gallops Respiratory: Diminished slightly bilaterally with some expiratory wheezing and mild rhonchi. Not tachypenic or using any accessory muscles to breathe Abdominal: Soft, NT, ND, bowel sounds + Extremities: no edema, no cyanosis  The results of significant diagnostics from this hospitalization (including imaging, microbiology, ancillary and laboratory) are listed below for reference.    Microbiology: Recent Results (from the past 240 hour(s))  Blood Culture (routine x 2)     Status: None (Preliminary result)   Collection Time: 07/30/16  8:42 PM  Result Value Ref Range Status   Specimen Description BLOOD BLOOD RIGHT FOREARM  Final   Special Requests   Final    BOTTLES DRAWN AEROBIC ONLY Blood Culture adequate volume   Culture    Final    NO GROWTH 2 DAYS Performed at [facility], [address], [place], [place] [address]    Report Status PENDING  Incomplete  Blood Culture (routine x 2)     Status: None (Preliminary result)   Collection Time: 07/30/16  8:50 PM  Result Value Ref Range Status   Specimen Description BLOOD LEFT HAND  Final   Special Requests Blood Culture adequate volume IN PEDIATRIC BOTTLE  Final   Culture   Final    NO GROWTH 2 DAYS Performed at [facility], [address] [place], [place] [address]    Report Status PENDING  Incomplete  Culture, respiratory (NON-Expectorated)     Status: None   Collection Time: 07/30/16 11:07 PM  Result Value Ref Range Status   Specimen Description SPUTUM  Final   Special Requests NONE  Final   Gram Stain   Final    ABUNDANT WBC PRESENT, PREDOMINANTLY PMN RARE GRAM POSITIVE COCCI IN CLUSTERS    Culture   Final    Consistent with normal respiratory flora. Performed at [facility], [address] [place], [place] [address]    Report Status 08/02/2016 FINAL  Final  Respiratory Panel by PCR     Status: None   Collection Time: 07/31/16  3:54 PM  Result Value Ref Range Status   Adenovirus NOT DETECTED NOT DETECTED Final   Coronavirus 229E NOT DETECTED NOT DETECTED Final   Coronavirus HKU1 NOT DETECTED NOT DETECTED Final   Coronavirus NL63 NOT DETECTED NOT DETECTED Final   Coronavirus OC43 NOT DETECTED NOT DETECTED Final   Metapneumovirus NOT DETECTED NOT DETECTED Final   Rhinovirus / Enterovirus NOT DETECTED NOT DETECTED Final   Influenza A NOT DETECTED NOT DETECTED Final   Influenza B NOT DETECTED NOT DETECTED Final   Parainfluenza Virus 1 NOT DETECTED NOT DETECTED Final   Parainfluenza Virus 2 NOT DETECTED NOT DETECTED Final   Parainfluenza Virus 3 NOT DETECTED NOT DETECTED Final   Parainfluenza Virus 4 NOT DETECTED NOT DETECTED Final   Respiratory Syncytial Virus  NOT DETECTED NOT DETECTED Final   Bordetella pertussis NOT  DETECTED NOT DETECTED Final   Chlamydophila pneumoniae NOT DETECTED NOT DETECTED Final   Mycoplasma pneumoniae NOT DETECTED NOT DETECTED Final    Comment: Performed at [facility], [address] [place], [place] [address]    Labs: BNP (last 3 results)  Recent Labs  06/12/16 2136  BNP 21.7   Basic Metabolic Panel:  Recent Labs Lab 07/30/16 2050 07/31/16 0346 07/31/16 0556 08/01/16 0534 08/02/16 0555  NA 136  --  138 138 138  K 3.9  --  4.2 3.9 4.1  CL 104  --  107 106 107  CO2 24  --  20* 22 24  GLUCOSE 135*  --  188* 283* 181*  BUN 10  --  CREATININE 0.69 0.70 0.60 0.65 0.61  CALCIUM 8.9  --  8.6* 8.6* 8.5*  MG  --   --  1.9 1.7 1.8  PHOS  --   --  2.0* 2.6 2.4*   Liver Function Tests:  Recent Labs Lab 07/30/16 2050 08/01/16 0534 08/02/16 0555  AST 26 19 12*  ALT 14 13* 14  ALKPHOS 83 71 64  BILITOT 1.0 0.4 0.2*  PROT 7.4 6.4* 6.1*  ALBUMIN 3.8 3.1* 3.0*   No results for input(s): LIPASE, AMYLASE in the last 168 hours. No results for input(s): AMMONIA in the last 168 hours. CBC:  Recent Labs Lab 07/30/16 2050 07/31/16 0556 08/01/16 0534 08/02/16 0555  WBC 15.4* 9.6 17.0* 12.6*  NEUTROABS 11.9* 8.9* 15.8* 10.6*  HGB 12.6 11.4* 11.3* 11.2*  HCT 38.8 36.1 35.5* 35.9*  MCV 82.7 81.1 84.1 82.5  PLT 231 185 230 221   Cardiac Enzymes: No results for input(s): CKTOTAL, CKMB, CKMBINDEX, TROPONINI in the last 168 hours. BNP: Invalid input(s): POCBNP CBG:  Recent Labs Lab 08/01/16 1137 08/01/16 2057 08/02/16 0726 08/02/16 1157  GLUCAP 175* 190* 134* 177*   D-Dimer No results for input(s): DDIMER in the last 72 hours. Hgb A1c  Recent Labs  08/01/16 0534  HGBA1C 5.6   Lipid Profile No results for input(s): CHOL, HDL, LDLCALC, TRIG, CHOLHDL, LDLDIRECT in the last 72 hours. Thyroid function studies No results for input(s): TSH, T4TOTAL, T3FREE, THYROIDAB in the last 72 hours.  Invalid input(s): FREET3 Anemia work up No  results for input(s): VITAMINB12, FOLATE, FERRITIN, TIBC, IRON, RETICCTPCT in the last 72 hours. Urinalysis    Component Value Date/Time   COLORURINE YELLOW 06/12/2016 2029   APPEARANCEUR HAZY (A) 06/12/2016 2029   LABSPEC 1.020 06/12/2016 2029   PHURINE 5.0 06/12/2016 2029   GLUCOSEU NEGATIVE 06/12/2016 2029   HGBUR MODERATE (A) 06/12/2016 2029   BILIRUBINUR NEGATIVE 06/12/2016 2029   KETONESUR NEGATIVE 06/12/2016 2029   PROTEINUR 30 (A) 06/12/2016 2029   UROBILINOGEN 1.0 02/16/2015 1231   NITRITE NEGATIVE 06/12/2016 2029   LEUKOCYTESUR NEGATIVE 06/12/2016 2029   Sepsis Labs Invalid input(s): PROCALCITONIN,  WBC,  LACTICIDVEN Microbiology Recent Results (from the past 240 hour(s))  Blood Culture (routine x 2)     Status: None (Preliminary result)   Collection Time: 07/30/16  8:42 PM  Result Value Ref Range Status   Specimen Description BLOOD BLOOD RIGHT FOREARM  Final   Special Requests   Final    BOTTLES DRAWN AEROBIC ONLY Blood Culture adequate volume   Culture   Final    NO GROWTH 2 DAYS Performed at [facility], [address], [place], [place] [address]  Report Status PENDING  Incomplete  Blood Culture (routine x 2)     Status: None (Preliminary result)   Collection Time: 07/30/16  8:50 PM  Result Value Ref Range Status   Specimen Description BLOOD LEFT HAND  Final   Special Requests Blood Culture adequate volume IN PEDIATRIC BOTTLE  Final   Culture   Final    NO GROWTH 2 DAYS Performed at [facility], [address] [place], [place] [address]    Report Status PENDING  Incomplete  Culture, respiratory (NON-Expectorated)     Status: None   Collection Time: 07/30/16 11:07 PM  Result Value Ref Range Status   Specimen Description SPUTUM  Final   Special Requests NONE  Final   Gram Stain   Final    ABUNDANT WBC PRESENT, PREDOMINANTLY PMN RARE GRAM POSITIVE COCCI IN CLUSTERS    Culture   Final    Consistent with normal respiratory flora. Performed  at [facility], [address] [place], [place] [address]    Report Status 08/02/2016 FINAL  Final  Respiratory Panel by PCR     Status: None   Collection Time: 07/31/16  3:54 PM  Result Value Ref Range Status   Adenovirus NOT DETECTED NOT DETECTED Final   Coronavirus 229E NOT DETECTED NOT DETECTED Final   Coronavirus HKU1 NOT DETECTED NOT DETECTED Final   Coronavirus NL63 NOT DETECTED NOT DETECTED Final   Coronavirus OC43 NOT DETECTED NOT DETECTED Final   Metapneumovirus NOT DETECTED NOT DETECTED Final   Rhinovirus / Enterovirus NOT DETECTED NOT DETECTED Final   Influenza A NOT DETECTED NOT DETECTED Final   Influenza B NOT DETECTED NOT DETECTED Final   Parainfluenza Virus 1 NOT DETECTED NOT DETECTED Final   Parainfluenza Virus 2 NOT DETECTED NOT DETECTED Final   Parainfluenza Virus 3 NOT DETECTED NOT DETECTED Final   Parainfluenza Virus 4 NOT DETECTED NOT DETECTED Final   Respiratory Syncytial Virus NOT DETECTED NOT DETECTED Final   Bordetella pertussis NOT DETECTED NOT DETECTED Final   Chlamydophila pneumoniae NOT DETECTED NOT DETECTED Final   Mycoplasma pneumoniae NOT DETECTED NOT DETECTED Final    Comment: Performed at [facility], [address], [place], [place] [address]   Time coordinating discharge: 35 minutes  SIGNED:  Merlene Laughter, DO [facility] 08/02/2016, 3:37 PM Pager [phone]  If 7PM-7AM, please contact night-coverage [email or web address] Password [email or web address]

## 2016-08-05 LAB — CULTURE, BLOOD (ROUTINE X 2)
Culture: NO GROWTH
Culture: NO GROWTH
SPECIAL REQUESTS: ADEQUATE
SPECIAL REQUESTS: ADEQUATE

## 2016-08-06 ENCOUNTER — Ambulatory Visit (INDEPENDENT_AMBULATORY_CARE_PROVIDER_SITE_OTHER): Payer: BC Managed Care – PPO | Admitting: Pulmonary Disease

## 2016-08-06 ENCOUNTER — Encounter: Payer: Self-pay | Admitting: Pulmonary Disease

## 2016-08-06 VITALS — BP 132/88 | HR 86 | Ht 65.0 in | Wt 244.8 lb

## 2016-08-06 DIAGNOSIS — J189 Pneumonia, unspecified organism: Secondary | ICD-10-CM

## 2016-08-06 DIAGNOSIS — J45901 Unspecified asthma with (acute) exacerbation: Secondary | ICD-10-CM

## 2016-08-06 LAB — NITRIC OXIDE: NITRIC OXIDE: 12

## 2016-08-06 NOTE — Patient Instructions (Signed)
Continue using your inhalers as you are doing   Return in 1-2 months. Get PFTs and CXR before visit.

## 2016-08-06 NOTE — Progress Notes (Signed)
Kara [name]    [id number]    September 29, 1985  Primary Care Physician:Alan Tenny Craw, MD  Referring Physician: [name], MD [address] [place], [place] [address]  Chief complaint:  Consult for evaluation of asthma  HPI: Kara [name] is a 31 year old with medical history of asthma, alcohol abuse, pancreatitis. She was diagnosed with asthma about 15 years ago. She follows with Dr. Lucie Leather and is maintained on symbicort, montelukast but has not had a visit there since January 2017. She has been tested for allergies before and reports sensitivity to cats, dogs, pollen, mold and multiple other allergens. She has seasonal allergies, postnasal drip and acid reflux.  She has been hospitalized at [facility] in February and April 2018 for recurrent pneumonia treated with antibiotics, steroids, nebulizers. All cultures were negative. She was negative for flu and viral infections on the RVP. She has been discharged on 4/16 and still is in a prednisone taper. She reports that her breathing is slowly getting back to normal. She still has some dyspnea with activity associated with occasional wheeze. Cough with clear mucus production. She denies any fevers, chills.  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 Exposures: No known exposures at work or at home, no mold issues Smoking history: Smokes a pack a day for past 5 years. Last alcohol use was in December 2017.  Outpatient Encounter Prescriptions as of 08/06/2016  Medication Sig  . albuterol (PROVENTIL HFA;VENTOLIN HFA) 108 (90 Base) MCG/ACT inhaler Inhale 2 puffs into the lungs every 6 (six) hours as needed for wheezing or shortness of breath.  Marland Kitchen albuterol (PROVENTIL) (2.5 MG/3ML) 0.083% nebulizer solution INHALE THE CONTENTS OF 1VIAL VIA NEBULIZER Q 4-6 H PRN  . budesonide-formoterol (SYMBICORT) 160-4.5 MCG/ACT inhaler Inhale 2 puffs into the lungs 2 (two) times daily.  . Cholecalciferol (VITAMIN D PO) Take 1  tablet by mouth at bedtime.  Marland Kitchen EPINEPHrine (EPIPEN 2-PAK) 0.3 mg/0.3 mL IJ SOAJ injection Inject 0.3 mg into the muscle once as needed (for severe allergic reaction).  . gabapentin (NEURONTIN) 300 MG capsule Take 900 mg by mouth 2 (two) times daily.  Marland Kitchen guaiFENesin (MUCINEX) 600 MG 12 hr tablet Take 2 tablets (1,200 mg total) by mouth 2 (two) times daily.  Marland Kitchen ipratropium-albuterol (DUONEB) 0.5-2.5 (3) MG/3ML SOLN USE 1 VIAL PER NEBULIZER EVERY 6 HOURS  FOR 30 DAYS  . levofloxacin (LEVAQUIN) 750 MG tablet Take 1 tablet (750 mg total) by mouth daily.  . Melatonin 10 MG TABS Take 10 mg by mouth at bedtime.  . meloxicam (MOBIC) 15 MG tablet Take 15 mg by mouth daily.  . metoprolol succinate (TOPROL-XL) 25 MG 24 hr tablet Take 1 tablet (25 mg total) by mouth daily. *Please call and schedule a one year follow up appointment*  . montelukast (SINGULAIR) 10 MG tablet TAKE 1 TABLET BY MOUTH DAILY (Patient taking differently: TAKE 1 TABLET BY MOUTH once daily at bedtime)  . Multiple Vitamins-Minerals (MULTIVITAMIN WITH MINERALS) tablet Take 1 tablet by mouth daily.  . naltrexone (DEPADE) 50 MG tablet Take 50 mg by mouth daily.  Marland Kitchen omeprazole (PRILOSEC) 40 MG capsule TAKE 1 CAPSULE(40 MG) BY MOUTH DAILY  . predniSONE (STERAPRED UNI-PAK 21 TAB) 10 MG (21) TBPK tablet Take 6 Tablets Day 1, 5 Tablets Day 2, 4 Tablets Day 3, 3 Tablets Day 4, 2 Tablets Day 5, 1 Tablet Day 6 and Stop  . QUEtiapine (SEROQUEL) 50 MG tablet Take 100 mg  by mouth at bedtime.   . sertraline (ZOLOFT) 50 MG tablet Take 75 mg by mouth daily.  Marland Kitchen thiamine 100 MG tablet Take 100 mg by mouth daily.  . [DISCONTINUED] nicotine (NICODERM CQ - DOSED IN MG/24 HOURS) 21 mg/24hr patch Place 1 patch (21 mg total) onto the skin daily.   No facility-administered encounter medications on file as of 08/06/2016.     Allergies as of 08/06/2016 - Review Complete 08/06/2016  Allergen Reaction Noted  . Peanuts [peanut oil] Hives and Itching 07/06/2014     Past Medical History:  Diagnosis Date  . Alcohol abuse   . Asthma   . Environmental allergies   . GERD (gastroesophageal reflux disease)   . Headache   . Hypertension   . Pancreatitis   . Prolonged QT syndrome   . Syncope and collapse   . Vision abnormalities     Past Surgical History:  Procedure Laterality Date  . ESOPHAGOGASTRODUODENOSCOPY N/A 07/17/2014   Procedure: ESOPHAGOGASTRODUODENOSCOPY (EGD);  Surgeon: Dorena Cookey, MD;  Location: [facility];  Service: Endoscopy;  Laterality: N/A;  . TYMPANOSTOMY TUBE PLACEMENT      Family History  Problem Relation Age of Onset  . Heart failure Mother   . Heart failure Father   . Diabetes Father   . Heart failure Maternal Grandfather     Social History   Social History  . Marital status: Married    Spouse name: [name]  . Number of children: 0  . Years of education: N/A   Occupational History  . College advisor    Social History Main Topics  . Smoking status: Current Every Day Smoker    Packs/day: 1.00    Years: 5.00    Types: Cigarettes    Last attempt to quit: 04/08/2012  . Smokeless tobacco: Never Used  . Alcohol use No  . Drug use: No  . Sexual activity: Yes    Birth control/ protection: None   Other Topics Concern  . Not on file   Social History Narrative   Married.    Review of systems: Review of Systems  Constitutional: Negative for fever and chills.  HENT: Negative.   Eyes: Negative for blurred vision.  Respiratory: as per HPI  Cardiovascular: Negative for chest pain and palpitations.  Gastrointestinal: Negative for vomiting, diarrhea, blood per rectum. Genitourinary: Negative for dysuria, urgency, frequency and hematuria.  Musculoskeletal: Negative for myalgias, back pain and joint pain.  Skin: Negative for itching and rash.  Neurological: Negative for dizziness, tremors, focal weakness, seizures and loss of consciousness.  Endo/Heme/Allergies: Negative for environmental allergies.   Psychiatric/Behavioral: Negative for depression, suicidal ideas and hallucinations.  All other systems reviewed and are negative.  Physical Exam: Blood pressure 132/88, pulse 86, height  (1.651 m), weight 244 lb 12.8 oz (111 kg), last menstrual period 07/09/2016, SpO2 96 %. Gen:      No acute distress HEENT:  EOMI, sclera anicteric Neck:     No masses; no thyromegaly Lungs:    Clear to auscultation bilaterally; normal respiratory effort CV:         Regular rate and rhythm; no murmurs Abd:      + bowel sounds; soft, non-tender; no palpable masses, no distension Ext:    No edema; adequate peripheral perfusion Skin:      Warm and dry; no rash Neuro: alert and oriented x 3 Psych: normal mood and affect  Data Reviewed: CT scan- 06/13/16- diffuse interstitial groundglass opacities, mild mediastinal adenopathy CXR 415/18- bilateral  interstitial opacities  Cleda Daub 05/13/15 FVC 3.02 (85%] FEV1 2.56 (84%) F/F 84 No obstruction  FENO 08/06/16-13  Assessment:  Moderate persistent asthma She is currently on Symbicort, Singulair, Ventolin. She still has a few more days of prednisone taper.  We will schedule her for pulmonary function tests. She'll require a CBC with differential and a IgE level but we will wait to get this done after she is off the steroids completely.  She also continues on omeprazole for her GERD symptoms. She'll use over-the-counter antihistamine and Flonase for seasonal allergies, postnasal drip.  Recent admission for recurrent pneumonias Continue to monitor symptoms. Get repeat chest x-ray at return visit to ensure clearance of bilateral infiltrates  Plan/Recommendations: - Continue Symbicort, Singulair, Ventolin - Continue PPI for GERD - Order pulmonary function tests, chest x-ray to be done on day of return visit  Return to clinic in 1-2 months.  Chilton Greathouse MD [facility] Pager [phone] 08/06/2016, 3:21 PM  CC: [name], MD

## 2016-08-26 ENCOUNTER — Inpatient Hospital Stay (HOSPITAL_COMMUNITY)
Admission: EM | Admit: 2016-08-26 | Discharge: 2016-09-01 | DRG: 193 | Disposition: A | Discharge status: Home / Self Care | Payer: BC Managed Care – PPO | Attending: Internal Medicine | Admitting: Internal Medicine

## 2016-08-26 ENCOUNTER — Encounter (HOSPITAL_COMMUNITY): Payer: Self-pay | Admitting: Emergency Medicine

## 2016-08-26 ENCOUNTER — Emergency Department (HOSPITAL_COMMUNITY): Payer: BC Managed Care – PPO

## 2016-08-26 DIAGNOSIS — I110 Hypertensive heart disease with heart failure: Secondary | ICD-10-CM | POA: Diagnosis present

## 2016-08-26 DIAGNOSIS — G629 Polyneuropathy, unspecified: Secondary | ICD-10-CM | POA: Diagnosis present

## 2016-08-26 DIAGNOSIS — Z79899 Other long term (current) drug therapy: Secondary | ICD-10-CM

## 2016-08-26 DIAGNOSIS — E871 Hypo-osmolality and hyponatremia: Secondary | ICD-10-CM | POA: Diagnosis present

## 2016-08-26 DIAGNOSIS — J189 Pneumonia, unspecified organism: Secondary | ICD-10-CM | POA: Diagnosis present

## 2016-08-26 DIAGNOSIS — I10 Essential (primary) hypertension: Secondary | ICD-10-CM | POA: Diagnosis not present

## 2016-08-26 DIAGNOSIS — Z720 Tobacco use: Secondary | ICD-10-CM | POA: Diagnosis not present

## 2016-08-26 DIAGNOSIS — J45901 Unspecified asthma with (acute) exacerbation: Secondary | ICD-10-CM | POA: Diagnosis not present

## 2016-08-26 DIAGNOSIS — F419 Anxiety disorder, unspecified: Secondary | ICD-10-CM | POA: Diagnosis present

## 2016-08-26 DIAGNOSIS — F1011 Alcohol abuse, in remission: Secondary | ICD-10-CM | POA: Diagnosis present

## 2016-08-26 DIAGNOSIS — J9621 Acute and chronic respiratory failure with hypoxia: Secondary | ICD-10-CM | POA: Diagnosis present

## 2016-08-26 DIAGNOSIS — Z6841 Body Mass Index (BMI) 40.0 and over, adult: Secondary | ICD-10-CM

## 2016-08-26 DIAGNOSIS — Y95 Nosocomial condition: Secondary | ICD-10-CM | POA: Diagnosis present

## 2016-08-26 DIAGNOSIS — Z7951 Long term (current) use of inhaled steroids: Secondary | ICD-10-CM | POA: Diagnosis not present

## 2016-08-26 DIAGNOSIS — I5032 Chronic diastolic (congestive) heart failure: Secondary | ICD-10-CM | POA: Diagnosis present

## 2016-08-26 DIAGNOSIS — J4551 Severe persistent asthma with (acute) exacerbation: Secondary | ICD-10-CM | POA: Diagnosis not present

## 2016-08-26 DIAGNOSIS — G4700 Insomnia, unspecified: Secondary | ICD-10-CM | POA: Diagnosis present

## 2016-08-26 DIAGNOSIS — Z91010 Allergy to peanuts: Secondary | ICD-10-CM

## 2016-08-26 DIAGNOSIS — R739 Hyperglycemia, unspecified: Secondary | ICD-10-CM | POA: Diagnosis present

## 2016-08-26 DIAGNOSIS — Z8249 Family history of ischemic heart disease and other diseases of the circulatory system: Secondary | ICD-10-CM | POA: Diagnosis not present

## 2016-08-26 DIAGNOSIS — J9691 Respiratory failure, unspecified with hypoxia: Secondary | ICD-10-CM | POA: Diagnosis present

## 2016-08-26 DIAGNOSIS — K219 Gastro-esophageal reflux disease without esophagitis: Secondary | ICD-10-CM | POA: Diagnosis present

## 2016-08-26 DIAGNOSIS — F17210 Nicotine dependence, cigarettes, uncomplicated: Secondary | ICD-10-CM | POA: Diagnosis present

## 2016-08-26 DIAGNOSIS — J4521 Mild intermittent asthma with (acute) exacerbation: Secondary | ICD-10-CM | POA: Diagnosis present

## 2016-08-26 DIAGNOSIS — J9601 Acute respiratory failure with hypoxia: Secondary | ICD-10-CM | POA: Diagnosis present

## 2016-08-26 DIAGNOSIS — R0602 Shortness of breath: Secondary | ICD-10-CM | POA: Diagnosis not present

## 2016-08-26 LAB — CBC WITH DIFFERENTIAL/PLATELET
BASOS PCT: 1 %
Basophils Absolute: 0.1 10*3/uL (ref 0.0–0.1)
EOS ABS: 1.4 10*3/uL — AB (ref 0.0–0.7)
EOS PCT: 10 %
HCT: 40.5 % (ref 36.0–46.0)
Hemoglobin: 12.9 g/dL (ref 12.0–15.0)
Lymphocytes Relative: 17 %
Lymphs Abs: 2.5 10*3/uL (ref 0.7–4.0)
MCH: 26.6 pg (ref 26.0–34.0)
MCHC: 31.9 g/dL (ref 30.0–36.0)
MCV: 83.5 fL (ref 78.0–100.0)
Monocytes Absolute: 0.7 10*3/uL (ref 0.1–1.0)
Monocytes Relative: 5 %
Neutro Abs: 9.7 10*3/uL — ABNORMAL HIGH (ref 1.7–7.7)
Neutrophils Relative %: 67 %
PLATELETS: 263 10*3/uL (ref 150–400)
RBC: 4.85 MIL/uL (ref 3.87–5.11)
RDW: 15.4 % (ref 11.5–15.5)
WBC: 14.4 10*3/uL — ABNORMAL HIGH (ref 4.0–10.5)

## 2016-08-26 LAB — COMPREHENSIVE METABOLIC PANEL
ALBUMIN: 4.1 g/dL (ref 3.5–5.0)
ALT: 11 U/L — AB (ref 14–54)
AST: 26 U/L (ref 15–41)
Alkaline Phosphatase: 78 U/L (ref 38–126)
Anion gap: 9 (ref 5–15)
BUN: 12 mg/dL (ref 6–20)
CHLORIDE: 102 mmol/L (ref 101–111)
CO2: 27 mmol/L (ref 22–32)
CREATININE: 0.72 mg/dL (ref 0.44–1.00)
Calcium: 8.6 mg/dL — ABNORMAL LOW (ref 8.9–10.3)
GFR calc Af Amer: >60 mL/min (ref >60)
GFR calc non Af Amer: >60 mL/min (ref >60)
GLUCOSE: 134 mg/dL — AB (ref 65–99)
Potassium: 4.4 mmol/L (ref 3.5–5.1)
SODIUM: 138 mmol/L (ref 135–145)
Total Bilirubin: 0.9 mg/dL (ref 0.3–1.2)
Total Protein: 7.7 g/dL (ref 6.5–8.1)

## 2016-08-26 LAB — D-DIMER, QUANTITATIVE: D-Dimer, Quant: 0.46 ug/mL-FEU (ref 0.00–0.50)

## 2016-08-26 MED ORDER — VITAMIN B-1 100 MG PO TABS
100.0000 mg | ORAL_TABLET | Freq: Every day | ORAL | Status: DC
  Administered 2016-08-27 – 2016-09-01 (×6): 100 mg via ORAL
  Filled 2016-08-26 (×6): qty 1

## 2016-08-26 MED ORDER — ALBUTEROL SULFATE (2.5 MG/3ML) 0.083% IN NEBU
5.0000 mg | INHALATION_SOLUTION | Freq: Once | RESPIRATORY_TRACT | Status: DC
  Filled 2016-08-26: qty 6

## 2016-08-26 MED ORDER — IPRATROPIUM BROMIDE 0.02 % IN SOLN
0.5000 mg | Freq: Once | RESPIRATORY_TRACT | Status: AC
  Administered 2016-08-26: 0.5 mg via RESPIRATORY_TRACT

## 2016-08-26 MED ORDER — IPRATROPIUM BROMIDE 0.02 % IN SOLN
0.5000 mg | Freq: Once | RESPIRATORY_TRACT | Status: AC
  Administered 2016-08-26: 0.5 mg via RESPIRATORY_TRACT
  Filled 2016-08-26: qty 2.5

## 2016-08-26 MED ORDER — DEXTROSE 5 % IV SOLN
1.0000 g | Freq: Three times a day (TID) | INTRAVENOUS | Status: DC
  Administered 2016-08-27 – 2016-09-01 (×16): 1 g via INTRAVENOUS
  Filled 2016-08-26 (×18): qty 1

## 2016-08-26 MED ORDER — IPRATROPIUM BROMIDE 0.02 % IN SOLN: 0.5000 mg | RESPIRATORY_TRACT | Status: DC

## 2016-08-26 MED ORDER — ACETAMINOPHEN 325 MG PO TABS
650.0000 mg | ORAL_TABLET | Freq: Four times a day (QID) | ORAL | Status: DC | PRN
  Administered 2016-08-29 – 2016-08-30 (×2): 650 mg via ORAL
  Filled 2016-08-26 (×2): qty 2

## 2016-08-26 MED ORDER — IPRATROPIUM BROMIDE 0.02 % IN SOLN: 1.0000 mg | Freq: Once | RESPIRATORY_TRACT | Status: DC

## 2016-08-26 MED ORDER — VANCOMYCIN HCL IN DEXTROSE 1-5 GM/200ML-% IV SOLN
1000.0000 mg | Freq: Once | INTRAVENOUS | Status: AC
  Administered 2016-08-26: 1000 mg via INTRAVENOUS
  Filled 2016-08-26: qty 200

## 2016-08-26 MED ORDER — IPRATROPIUM-ALBUTEROL 0.5-2.5 (3) MG/3ML IN SOLN
3.0000 mL | RESPIRATORY_TRACT | Status: DC
  Administered 2016-08-26 – 2016-08-28 (×12): 3 mL via RESPIRATORY_TRACT
  Filled 2016-08-26 (×12): qty 3

## 2016-08-26 MED ORDER — METOPROLOL SUCCINATE ER 25 MG PO TB24
25.0000 mg | ORAL_TABLET | Freq: Every day | ORAL | Status: DC
  Administered 2016-08-27 – 2016-09-01 (×6): 25 mg via ORAL
  Filled 2016-08-26 (×6): qty 1

## 2016-08-26 MED ORDER — PANTOPRAZOLE SODIUM 40 MG PO TBEC
40.0000 mg | DELAYED_RELEASE_TABLET | Freq: Every day | ORAL | Status: DC
  Administered 2016-08-27 – 2016-09-01 (×6): 40 mg via ORAL
  Filled 2016-08-26 (×6): qty 1

## 2016-08-26 MED ORDER — GUAIFENESIN ER 600 MG PO TB12
1200.0000 mg | ORAL_TABLET | Freq: Two times a day (BID) | ORAL | Status: DC
  Administered 2016-08-26 – 2016-09-01 (×12): 1200 mg via ORAL
  Filled 2016-08-26 (×13): qty 2

## 2016-08-26 MED ORDER — METHYLPREDNISOLONE SODIUM SUCC 40 MG IJ SOLR
40.0000 mg | Freq: Two times a day (BID) | INTRAMUSCULAR | Status: DC
  Administered 2016-08-27 – 2016-08-28 (×3): 40 mg via INTRAVENOUS
  Filled 2016-08-26 (×3): qty 1

## 2016-08-26 MED ORDER — ALBUTEROL SULFATE (2.5 MG/3ML) 0.083% IN NEBU: 2.5000 mg | INHALATION_SOLUTION | RESPIRATORY_TRACT | Status: DC

## 2016-08-26 MED ORDER — ALBUTEROL SULFATE (2.5 MG/3ML) 0.083% IN NEBU: 2.5000 mg | INHALATION_SOLUTION | RESPIRATORY_TRACT | Status: DC | PRN

## 2016-08-26 MED ORDER — MAGNESIUM SULFATE 2 GM/50ML IV SOLN
2.0000 g | Freq: Once | INTRAVENOUS | Status: AC
  Administered 2016-08-26: 2 g via INTRAVENOUS
  Filled 2016-08-26: qty 50

## 2016-08-26 MED ORDER — CEFEPIME HCL 2 G IJ SOLR
2.0000 g | Freq: Once | INTRAMUSCULAR | Status: AC
  Administered 2016-08-26: 2 g via INTRAVENOUS
  Filled 2016-08-26: qty 2

## 2016-08-26 MED ORDER — METHYLPREDNISOLONE SODIUM SUCC 125 MG IJ SOLR
125.0000 mg | Freq: Once | INTRAMUSCULAR | Status: AC
  Administered 2016-08-26: 125 mg via INTRAVENOUS
  Filled 2016-08-26: qty 2

## 2016-08-26 MED ORDER — ALBUTEROL SULFATE (2.5 MG/3ML) 0.083% IN NEBU
5.0000 mg | INHALATION_SOLUTION | Freq: Once | RESPIRATORY_TRACT | Status: AC
  Administered 2016-08-26: 5 mg via RESPIRATORY_TRACT
  Filled 2016-08-26: qty 6

## 2016-08-26 MED ORDER — NALTREXONE HCL 50 MG PO TABS
50.0000 mg | ORAL_TABLET | Freq: Every day | ORAL | Status: DC
  Administered 2016-08-27 – 2016-09-01 (×6): 50 mg via ORAL
  Filled 2016-08-26 (×6): qty 1

## 2016-08-26 MED ORDER — GABAPENTIN 300 MG PO CAPS
900.0000 mg | ORAL_CAPSULE | Freq: Two times a day (BID) | ORAL | Status: DC
  Administered 2016-08-26 – 2016-09-01 (×12): 900 mg via ORAL
  Filled 2016-08-26 (×12): qty 3

## 2016-08-26 MED ORDER — ALBUTEROL (5 MG/ML) CONTINUOUS INHALATION SOLN
10.0000 mg/h | INHALATION_SOLUTION | RESPIRATORY_TRACT | Status: DC
  Administered 2016-08-26: 10 mg/h via RESPIRATORY_TRACT

## 2016-08-26 MED ORDER — ALBUTEROL (5 MG/ML) CONTINUOUS INHALATION SOLN: 15.0000 mg/h | INHALATION_SOLUTION | RESPIRATORY_TRACT | Status: DC

## 2016-08-26 MED ORDER — SERTRALINE HCL 50 MG PO TABS
75.0000 mg | ORAL_TABLET | Freq: Every day | ORAL | Status: DC
  Administered 2016-08-27 – 2016-09-01 (×6): 75 mg via ORAL
  Filled 2016-08-26 (×5): qty 2

## 2016-08-26 MED ORDER — QUETIAPINE FUMARATE 100 MG PO TABS
100.0000 mg | ORAL_TABLET | Freq: Every day | ORAL | Status: DC
  Administered 2016-08-26 – 2016-08-31 (×6): 100 mg via ORAL
  Filled 2016-08-26 (×6): qty 1

## 2016-08-26 MED ORDER — ENOXAPARIN SODIUM 40 MG/0.4ML ~~LOC~~ SOLN
40.0000 mg | Freq: Every day | SUBCUTANEOUS | Status: DC
  Administered 2016-08-26: 40 mg via SUBCUTANEOUS
  Filled 2016-08-26: qty 0.4

## 2016-08-26 MED ORDER — MONTELUKAST SODIUM 10 MG PO TABS
10.0000 mg | ORAL_TABLET | Freq: Every day | ORAL | Status: DC
  Administered 2016-08-26 – 2016-08-31 (×6): 10 mg via ORAL
  Filled 2016-08-26 (×6): qty 1

## 2016-08-26 MED ORDER — BUDESONIDE 0.25 MG/2ML IN SUSP
0.2500 mg | Freq: Two times a day (BID) | RESPIRATORY_TRACT | Status: DC
  Administered 2016-08-26 – 2016-09-01 (×12): 0.25 mg via RESPIRATORY_TRACT
  Filled 2016-08-26 (×12): qty 2

## 2016-08-26 MED ORDER — ACETAMINOPHEN 650 MG RE SUPP: 650.0000 mg | Freq: Four times a day (QID) | RECTAL | Status: DC | PRN

## 2016-08-26 NOTE — H&P (Signed)
History and Physical    Armentha Branagan ZOX:[id number] DOB: [birth year] DOA: 08/26/2016  PCP: [name], MD  Patient coming from: Home.  Chief Complaint: Shortness of breath.  HPI: Kara [name] is a 31 y.o. female with known history of bronchial asthma, ongoing tobacco abuse presents to the ER with shortness of breath and wheezing over the last 3 days. Patient also states productive cough but denies any hemoptysis. Has been having subjective feeling of fever chills. Denies any chest pain. This is the third admission for the patient is last 2 months for shortness of breath and possible pneumonia. Patient denies any sick contacts or recent travel. Denies any mold in the house or any birds.   ED Course: In the ER patient was found to be wheezing and short of breath but able to complete sentences. Patient also was hypoxic. Chest x-ray shows persistent bilateral infiltrates which was worsening in the right side. Patient states she just recently completed antibiotics and prednisone until then patient was doing fine. Once the course of antibiotics and steroids for over patient started developing wheezing again.  Review of Systems: As per HPI, rest all negative.   Past Medical History:  Diagnosis Date  . Alcohol abuse   . Asthma   . Environmental allergies   . GERD (gastroesophageal reflux disease)   . Headache   . Hypertension   . Pancreatitis   . Prolonged QT syndrome   . Syncope and collapse   . Vision abnormalities     Past Surgical History:  Procedure Laterality Date  . ESOPHAGOGASTRODUODENOSCOPY N/A 07/17/2014   Procedure: ESOPHAGOGASTRODUODENOSCOPY (EGD);  Surgeon: Dorena Cookey, MD;  Location: [facility];  Service: Endoscopy;  Laterality: N/A;  . TYMPANOSTOMY TUBE PLACEMENT       reports that she has been smoking Cigarettes.  She has a 5.00 pack-year smoking history. She has never used smokeless tobacco. She reports that she does not drink alcohol or use drugs.  Allergies   Allergen Reactions  . Peanuts [Peanut Oil] Hives and Itching    Family History  Problem Relation Age of Onset  . Heart failure Mother   . Heart failure Father   . Diabetes Father   . Heart failure Maternal Grandfather     Prior to Admission medications   Medication Sig Start Date End Date Taking? Authorizing Provider  albuterol (PROVENTIL HFA;VENTOLIN HFA) 108 (90 Base) MCG/ACT inhaler Inhale 2 puffs into the lungs every 6 (six) hours as needed for wheezing or shortness of breath.   Yes [provider]  albuterol (PROVENTIL) (2.5 MG/3ML) 0.083% nebulizer solution INHALE THE CONTENTS OF 1VIAL VIA NEBULIZER Q 4-6 H PRN 07/21/16  Yes [provider]  budesonide-formoterol (SYMBICORT) 160-4.5 MCG/ACT inhaler Inhale 2 puffs into the lungs 2 (two) times daily.   Yes [provider]  chlorhexidine (PERIDEX) 0.12 % solution Use as directed 15 mLs in the mouth or throat 2 (two) times daily. After meals   Yes [provider]  Cholecalciferol (VITAMIN D PO) Take 1 tablet by mouth daily.    Yes [provider]  gabapentin (NEURONTIN) 300 MG capsule Take 900 mg by mouth 2 (two) times daily.   Yes [provider]  guaiFENesin (MUCINEX) 600 MG 12 hr tablet Take 2 tablets (1,200 mg total) by mouth 2 (two) times daily. 08/02/16  Yes Sheikh, Omair Latif, DO  ipratropium-albuterol (DUONEB) 0.5-2.5 (3) MG/3ML SOLN USE 1 VIAL PER NEBULIZER EVERY 6 HOURS  FOR 30 DAYS 07/27/16  Yes [provider]  Melatonin 10 MG TABS Take 10 mg by mouth at bedtime.   Yes [provider]  meloxicam (MOBIC) 15 MG tablet Take 15 mg by mouth daily.   Yes [provider]  metoprolol succinate (TOPROL-XL) 25 MG 24 hr tablet Take 1 tablet (25 mg total) by mouth daily. *Please call and schedule a one year follow up appointment* 06/07/16  Yes Wendall Stade, MD  montelukast (SINGULAIR) 10 MG tablet TAKE 1 TABLET BY MOUTH DAILY Patient taking differently: TAKE  1 TABLET BY MOUTH once daily at bedtime 12/08/15  Yes Kozlow, Alvira Philips, MD  Multiple Vitamins-Minerals (MULTIVITAMIN WITH MINERALS) tablet Take 1 tablet by mouth daily.   Yes [provider]  naltrexone (DEPADE) 50 MG tablet Take 50 mg by mouth daily.   Yes [provider]  omeprazole (PRILOSEC) 40 MG capsule TAKE 1 CAPSULE(40 MG) BY MOUTH DAILY 07/01/16  Yes Kozlow, Alvira Philips, MD  QUEtiapine (SEROQUEL) 50 MG tablet Take 100 mg by mouth at bedtime.    Yes [provider]  sertraline (ZOLOFT) 50 MG tablet Take 75 mg by mouth daily.   Yes [provider]  thiamine 100 MG tablet Take 100 mg by mouth daily.   Yes [provider]  EPINEPHrine (EPIPEN 2-PAK) 0.3 mg/0.3 mL IJ SOAJ injection Inject 0.3 mg into the muscle once as needed (for severe allergic reaction).    [provider]  levofloxacin (LEVAQUIN) 750 MG tablet Take 1 tablet (750 mg total) by mouth daily. Patient not taking: Reported on 08/26/2016 08/03/16   Marguerita Merles Latif, DO  predniSONE (STERAPRED UNI-PAK 21 TAB) 10 MG (21) TBPK tablet Take 6 Tablets Day 1, 5 Tablets Day 2, 4 Tablets Day 3, 3 Tablets Day 4, 2 Tablets Day 5, 1 Tablet Day 6 and Stop 08/02/16   Marguerita Merles [place], [place]    Physical Exam: Vitals:   08/26/16 2030 08/26/16 2100 08/26/16 2130 08/26/16 2204  BP:  127/65 (!) 107/47 121/79  Pulse: (!) 116 (!) 103 (!) 101 98  Resp: (!) 26 (!) 21 (!) 26 18  SpO2: 94% 90% 91% 93%      Constitutional: Moderately built and nourished. Vitals:   08/26/16 2030 08/26/16 2100 08/26/16 2130 08/26/16 2204  BP:  127/65 (!) 107/47 121/79  Pulse: (!) 116 (!) 103 (!) 101 98  Resp: (!) 26 (!) 21 (!) 26 18  SpO2: 94% 90% 91% 93%   Eyes: Anicteric no pallor. ENMT: No discharge from the ears eyes nose and mouth. Neck: No JVD appreciated no mass felt. Respiratory: Bilateral expiratory wheeze and no crepitations. Cardiovascular: S1 and S2 heard no murmurs appreciated. Abdomen: Soft nontender  bowel sounds present. Musculoskeletal: No edema. No joint effusion. Skin: No rash. Skin appears warm. Neurologic: Alert awake oriented to time place and person. Moves all extremities. Psychiatric: Appears normal. Normal affect.   Labs on Admission: I have personally reviewed following labs and imaging studies  CBC:  Recent Labs Lab 08/26/16 1933  WBC 14.4*  NEUTROABS 9.7*  HGB 12.9  HCT 40.5  MCV 83.5  PLT 263   Basic Metabolic Panel:  Recent Labs Lab 08/26/16 1933  NA 138  K 4.4  CL 102  CO2 27  GLUCOSE 134*  BUN 12  CREATININE 0.72  CALCIUM 8.6*   GFR: CrCl cannot be calculated (Unknown ideal weight.). Liver Function Tests:  Recent Labs Lab 08/26/16 1933  AST 26  ALT 11*  ALKPHOS 78  BILITOT 0.9  PROT  7.7  ALBUMIN 4.1   No results for input(s): LIPASE, AMYLASE in the last 168 hours. No results for input(s): AMMONIA in the last 168 hours. Coagulation Profile: No results for input(s): INR, PROTIME in the last 168 hours. Cardiac Enzymes: No results for input(s): CKTOTAL, CKMB, CKMBINDEX, TROPONINI in the last 168 hours. BNP (last 3 results) No results for input(s): PROBNP in the last 8760 hours. HbA1C: No results for input(s): HGBA1C in the last 72 hours. CBG: No results for input(s): GLUCAP in the last 168 hours. Lipid Profile: No results for input(s): CHOL, HDL, LDLCALC, TRIG, CHOLHDL, LDLDIRECT in the last 72 hours. Thyroid Function Tests: No results for input(s): TSH, T4TOTAL, FREET4, T3FREE, THYROIDAB in the last 72 hours. Anemia Panel: No results for input(s): VITAMINB12, FOLATE, FERRITIN, TIBC, IRON, RETICCTPCT in the last 72 hours. Urine analysis:    Component Value Date/Time   COLORURINE YELLOW 06/12/2016 2029   APPEARANCEUR HAZY (A) 06/12/2016 2029   LABSPEC 1.020 06/12/2016 2029   PHURINE 5.0 06/12/2016 2029   GLUCOSEU NEGATIVE 06/12/2016 2029   HGBUR MODERATE (A) 06/12/2016 2029   BILIRUBINUR NEGATIVE 06/12/2016 2029   KETONESUR  NEGATIVE 06/12/2016 2029   PROTEINUR 30 (A) 06/12/2016 2029   UROBILINOGEN 1.0 02/16/2015 1231   NITRITE NEGATIVE 06/12/2016 2029   LEUKOCYTESUR NEGATIVE 06/12/2016 2029   Sepsis Labs: @LABRCNTIP (procalcitonin:4,lacticidven:4) )No results found for this or any previous visit (from the past 240 hour(s)).   Radiological Exams on Admission: Dg Chest 2 View  Result Date: 08/26/2016 CLINICAL DATA:  Shortness of breath.  Productive cough. EXAM: CHEST  2 VIEW COMPARISON:  Most recent radiographs 08/01/2016, chest CT 06/13/2016 FINDINGS: Unchanged heart size and mediastinal contours. There are persistent bilateral heterogeneous airspace opacities in a lower lung zone predominant distribution. Assess slightly progressed at the right lung base. Possible trace right pleural effusion. No pneumothorax. No acute osseous abnormalities. IMPRESSION: Persistent bilateral heterogeneous airspace opacities, progressed at the right lung base. In conjunction with prior chest CT, findings can be seen in the setting of pulmonary hemorrhage or interstitial lung disease. Atypical infection could have a similar appearance. Electronically Signed   By: Rubye OaksMelanie  Ehinger M.D.   On: 08/26/2016 20:42    EKG: Independently reviewed. Sinus tachycardia.  Assessment/Plan Principal Problem:   Acute respiratory failure with hypoxia (HCC) Active Problems:   Benign essential HTN   Asthma exacerbation   Alcohol abuse, in remission   HCAP (healthcare-associated pneumonia)    1. Acute respiratory failure with hypoxia probably secondary to asthma and pneumonia - patient is placed on vancomycin and cefepime Solu-Medrol Pulmicort nebulizer. Check sputum cultures urine for Legionella and strep antigen. Since patient has been having recurrent symptoms over the last 2 months I have consulted pulmonologist. CT scan also discussed possibility of pulmonary hemorrhage but at this time patient denies any hemoptysis. 2. Hypertension with  history of prolonged QT on metoprolol. 3. History of alcohol abuse in remission on naltrexone and thiamine. 4. Tobacco abuse - tobacco cessation counseling requested.   DVT prophylaxis: Initially started on Lovenox but in anticipation of possible pulmonary procedure patient is placed on SCDs. Code Status: Full code.  Family Communication: Patient's husband.  Disposition Plan: Home.  Consults called: Pulmonologist.  Admission status: Inpatient.    Eduard ClosKAKRAKANDY,Alf Doyle N. MD [facility] Pager [phone].  If 7PM-7AM, please contact night-coverage [email or web address] Password [email or web address]  08/26/2016, 10:30 PM

## 2016-08-26 NOTE — ED Triage Notes (Signed)
Pt reports she has been treated for PNA in the past 3 months. Pt reports she has had SOB for the past 3 days accompanied by productive cough. Feels like PNA is returning again. SPO2 86% on RA.

## 2016-08-26 NOTE — ED Notes (Signed)
Pt still unable to give urine sample, but aware that we need one.  

## 2016-08-26 NOTE — ED Provider Notes (Signed)
[facility]-EMERGENCY DEPT Provider Note   CSN: [id number] Arrival date & time: 08/26/16  1831     History   Chief Complaint Chief Complaint  Patient presents with  . Shortness of Breath    HPI Kara [name] is a 31 y.o. female.   Shortness of Breath  This is a recurrent problem. The average episode lasts 2 days. The problem occurs continuously.The problem has not changed since onset.Associated symptoms include cough and sputum production. Pertinent negatives include no fever. She has tried nothing for the symptoms. The treatment provided mild relief. She has had prior hospitalizations. She has had prior ED visits. She has had prior ICU admissions.    Past Medical History:  Diagnosis Date  . Alcohol abuse   . Asthma   . Environmental allergies   . GERD (gastroesophageal reflux disease)   . Headache   . Hypertension   . Pancreatitis   . Prolonged QT syndrome   . Syncope and collapse   . Vision abnormalities     Patient Active Problem List   Diagnosis Date Noted  . Acute respiratory failure with hypoxia (HCC) 08/26/2016  . HCAP (healthcare-associated pneumonia) 07/31/2016  . Tobacco abuse 07/31/2016  . Respiratory failure with hypoxia (HCC) 07/31/2016  . CAP (community acquired pneumonia) 06/13/2016  . Asthma exacerbation 06/12/2016  . Alcohol abuse, in remission 06/12/2016  . Sepsis (HCC) 06/12/2016  . Pancreatitis 09/09/2015  . GERD (gastroesophageal reflux disease) 09/09/2015  . Benign essential HTN 09/09/2015  . FUO (fever of unknown origin)   . Abdominal hematoma 02/10/2015  . Warfarin-induced coagulopathy (HCC) 02/10/2015  . Hypertension 02/10/2015  . Hypoalbuminemia due to protein-calorie malnutrition (HCC) 02/10/2015  . Alcoholic pancreatitis 02/10/2015  . Acute blood loss anemia   . Intra-abdominal hematoma 02/09/2015  . Pancreatitis, acute   . 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 Acute pancreatitis 01/29/2015  . Sinus tachycardia 01/29/2015  .  Leukocytosis 01/29/2015  . AKI (acute kidney injury) (HCC) 01/29/2015  . Insomnia 10/29/2014  . Polyneuropathy 09/02/2014  . History of alcohol abuse 09/02/2014  . Edema 09/02/2014  . Neck pain 09/02/2014  . Chronic lower back pain 09/02/2014  . Numbness of foot 08/02/2014  . Hand numbness 08/02/2014  . Ataxic gait 08/02/2014  . Proximal leg weakness 08/02/2014  . Constipation   . Intractable abdominal pain 07/16/2014  . Hypothermia 07/16/2014  . Upper abdominal pain   . Abdominal pain, epigastric 07/06/2014  . Obesity (BMI 30-39.9) 07/06/2014  . Chronic diastolic heart failure (HCC) 07/06/2014  . Syncope 04/08/2014  . Prolonged Q-T interval on ECG 04/08/2014  . Hypokalemia 04/08/2014  . Elevated LFTs 04/08/2014    Past Surgical History:  Procedure Laterality Date  . ESOPHAGOGASTRODUODENOSCOPY N/A 07/17/2014   Procedure: ESOPHAGOGASTRODUODENOSCOPY (EGD);  Surgeon: Dorena Cookey, MD;  Location: [facility];  Service: Endoscopy;  Laterality: N/A;  . TYMPANOSTOMY TUBE PLACEMENT      OB History    No data available       Home Medications    Prior to Admission medications   Medication Sig Start Date End Date Taking? Authorizing Provider  albuterol (PROVENTIL HFA;VENTOLIN HFA) 108 (90 Base) MCG/ACT inhaler Inhale 2 puffs into the lungs every 6 (six) hours as needed for wheezing or shortness of breath.   Yes [provider]  albuterol (PROVENTIL) (2.5 MG/3ML) 0.083% nebulizer solution INHALE THE CONTENTS OF 1VIAL VIA NEBULIZER Q 4-6 H PRN 07/21/16  Yes [provider]  budesonide-formoterol (SYMBICORT) 160-4.5 MCG/ACT inhaler Inhale 2 puffs into the lungs  2 (two) times daily.   Yes [provider]  chlorhexidine (PERIDEX) 0.12 % solution Use as directed 15 mLs in the mouth or throat 2 (two) times daily. After meals   Yes [provider]  Cholecalciferol (VITAMIN D PO) Take 1 tablet by mouth daily.    Yes [provider]  gabapentin  (NEURONTIN) 300 MG capsule Take 900 mg by mouth 2 (two) times daily.   Yes [provider]  guaiFENesin (MUCINEX) 600 MG 12 hr tablet Take 2 tablets (1,200 mg total) by mouth 2 (two) times daily. 08/02/16  Yes Sheikh, Omair Latif, DO  ipratropium-albuterol (DUONEB) 0.5-2.5 (3) MG/3ML SOLN USE 1 VIAL PER NEBULIZER EVERY 6 HOURS  FOR 30 DAYS 07/27/16  Yes [provider]  Melatonin 10 MG TABS Take 10 mg by mouth at bedtime.   Yes [provider]  meloxicam (MOBIC) 15 MG tablet Take 15 mg by mouth daily.   Yes [provider]  metoprolol succinate (TOPROL-XL) 25 MG 24 hr tablet Take 1 tablet (25 mg total) by mouth daily. *Please call and schedule a one year follow up appointment* 06/07/16  Yes Wendall StadeNishan, Peter C, MD  montelukast (SINGULAIR) 10 MG tablet TAKE 1 TABLET BY MOUTH DAILY Patient taking differently: TAKE 1 TABLET BY MOUTH once daily at bedtime 12/08/15  Yes Kozlow, Alvira PhilipsEric J, MD  Multiple Vitamins-Minerals (MULTIVITAMIN WITH MINERALS) tablet Take 1 tablet by mouth daily.   Yes [provider]  naltrexone (DEPADE) 50 MG tablet Take 50 mg by mouth daily.   Yes [provider]  omeprazole (PRILOSEC) 40 MG capsule TAKE 1 CAPSULE(40 MG) BY MOUTH DAILY 07/01/16  Yes Kozlow, Alvira PhilipsEric J, MD  QUEtiapine (SEROQUEL) 50 MG tablet Take 100 mg by mouth at bedtime.    Yes [provider]  sertraline (ZOLOFT) 50 MG tablet Take 75 mg by mouth daily.   Yes [provider]  thiamine 100 MG tablet Take 100 mg by mouth daily.   Yes [provider]  EPINEPHrine (EPIPEN 2-PAK) 0.3 mg/0.3 mL IJ SOAJ injection Inject 0.3 mg into the muscle once as needed (for severe allergic reaction).    [provider]  levofloxacin (LEVAQUIN) 750 MG tablet Take 1 tablet (750 mg total) by mouth daily. Patient not taking: Reported on 08/26/2016 08/03/16   Marguerita MerlesSheikh, Omair Latif, DO  predniSONE (STERAPRED UNI-PAK 21 TAB) 10 MG (21) TBPK tablet Take 6 Tablets Day 1,  5 Tablets Day 2, 4 Tablets Day 3, 3 Tablets Day 4, 2 Tablets Day 5, 1 Tablet Day 6 and Stop 08/02/16   Merlene LaughterSheikh, Omair Latif, DO    Family History Family History  Problem Relation Age of Onset  . Heart failure Mother   . Heart failure Father   . Diabetes Father   . Heart failure Maternal Grandfather     Social History Social History  Substance Use Topics  . Smoking status: Current Every Day Smoker    Packs/day: 1.00    Years: 5.00    Types: Cigarettes    Last attempt to quit: 04/08/2012  . Smokeless tobacco: Never Used  . Alcohol use No     Allergies   Peanuts [peanut oil]   Review of Systems Review of Systems  Constitutional: Negative for fever.  Respiratory: Positive for cough, sputum production and shortness of breath.   All other systems reviewed and are negative.    Physical Exam Updated Vital Signs BP 135/76 (BP Location: Left Arm)   Pulse (!) 107  Temp 99.4 F (37.4 C) (Oral)   Resp 18   Ht 5\' 5"  (1.651 m)   Wt 242 lb 8.1 oz (110 kg)   LMP 07/27/2016   SpO2 94%   BMI 40.36 kg/m   Physical Exam  Constitutional: She is oriented to person, place, and time. She appears well-developed and well-nourished. She appears distressed.  HENT:  Head: Normocephalic and atraumatic.  Eyes: Conjunctivae and EOM are normal.  Neck: Normal range of motion.  Cardiovascular: Normal rate and regular rhythm.   Pulmonary/Chest: Accessory muscle usage present. No stridor. Tachypnea noted. She is in respiratory distress. She has decreased breath sounds. She has wheezes.  Abdominal: Soft. She exhibits no distension. There is no tenderness.  Neurological: She is alert and oriented to person, place, and time.  Skin: Skin is warm and dry.  Nursing note and vitals reviewed.    ED Treatments / Results  Labs (all labs ordered are listed, but only abnormal results are displayed) Labs Reviewed  CBC WITH DIFFERENTIAL/PLATELET - Abnormal; Notable for the following:       Result  Value   WBC 14.4 (*)    Neutro Abs 9.7 (*)    Eosinophils Absolute 1.4 (*)    All other components within normal limits  COMPREHENSIVE METABOLIC PANEL - Abnormal; Notable for the following:    Glucose, Bld 134 (*)    Calcium 8.6 (*)    ALT 11 (*)    All other components within normal limits  CULTURE, EXPECTORATED SPUTUM-ASSESSMENT  GRAM STAIN  D-DIMER, QUANTITATIVE (NOT AT [facility])  PREGNANCY, URINE  STREP PNEUMONIAE URINARY ANTIGEN  BASIC METABOLIC PANEL  CBC  LEGIONELLA PNEUMOPHILA SEROGP 1 UR AG    EKG  EKG Interpretation  Date/Time:  Thursday Aug 26 2016 18:48:31 EDT Ventricular Rate:  113 PR Interval:    QRS Duration: 87 QT Interval:  340 QTC Calculation: 467 R Axis:   88 Text Interpretation:  Sinus tachycardia Confirmed by [facility] MD, Barbara Cower [id number]) on 08/26/2016 11:20:27 PM       Radiology Dg Chest 2 View  Result Date: 08/26/2016 CLINICAL DATA:  Shortness of breath.  Productive cough. EXAM: CHEST  2 VIEW COMPARISON:  Most recent radiographs 08/01/2016, chest CT 06/13/2016 FINDINGS: Unchanged heart size and mediastinal contours. There are persistent bilateral heterogeneous airspace opacities in a lower lung zone predominant distribution. Assess slightly progressed at the right lung base. Possible trace right pleural effusion. No pneumothorax. No acute osseous abnormalities. IMPRESSION: Persistent bilateral heterogeneous airspace opacities, progressed at the right lung base. In conjunction with prior chest CT, findings can be seen in the setting of pulmonary hemorrhage or interstitial lung disease. Atypical infection could have a similar appearance. Electronically Signed   By: Rubye Oaks M.D.   On: 08/26/2016 20:42    Procedures Procedures (including critical care time)  CRITICAL CARE Performed by: Marily Memos Total critical care time: 35 minutes Critical care time was exclusive of separately billable procedures and treating other patients. Critical care was  necessary to treat or prevent imminent or life-threatening deterioration. Critical care was time spent personally by me on the following activities: development of treatment plan with patient and/or surrogate as well as nursing, discussions with consultants, evaluation of patient's response to treatment, examination of patient, obtaining history from patient or surrogate, ordering and performing treatments and interventions, ordering and review of laboratory studies, ordering and review of radiographic studies, pulse oximetry and re-evaluation of patient's condition.   Medications Ordered in ED Medications  guaiFENesin (MUCINEX)  12 hr tablet 1,200 mg (not administered)  pantoprazole (PROTONIX) EC tablet 40 mg (not administered)  naltrexone (DEPADE) tablet 50 mg (not administered)  sertraline (ZOLOFT) tablet 75 mg (not administered)  metoprolol succinate (TOPROL-XL) 24 hr tablet 25 mg (not administered)  QUEtiapine (SEROQUEL) tablet 100 mg (not administered)  thiamine (VITAMIN B-1) tablet 100 mg (not administered)  montelukast (SINGULAIR) tablet 10 mg (not administered)  gabapentin (NEURONTIN) capsule 900 mg (not administered)  methylPREDNISolone sodium succinate (SOLU-MEDROL) 40 mg/mL injection 40 mg (not administered)  budesonide (PULMICORT) nebulizer solution 0.25 mg (not administered)  acetaminophen (TYLENOL) tablet 650 mg (not administered)    Or  acetaminophen (TYLENOL) suppository 650 mg (not administered)  ceFEPIme (MAXIPIME) 1 g in dextrose 5 % 50 mL IVPB (not administered)  enoxaparin (LOVENOX) injection 40 mg (not administered)  albuterol (PROVENTIL) (2.5 MG/3ML) 0.083% nebulizer solution 2.5 mg (not administered)  ipratropium-albuterol (DUONEB) 0.5-2.5 (3) MG/3ML nebulizer solution 3 mL (not administered)  ipratropium (ATROVENT) nebulizer solution 0.5 mg (0.5 mg Nebulization Given 08/26/16 1851)  magnesium sulfate IVPB 2 g 50 mL (0 g Intravenous Stopped 08/26/16 2119)    methylPREDNISolone sodium succinate (SOLU-MEDROL) 125 mg/2 mL injection 125 mg (125 mg Intravenous Given 08/26/16 1939)  ipratropium (ATROVENT) nebulizer solution 0.5 mg (0.5 mg Nebulization Given 08/26/16 1926)  albuterol (PROVENTIL) (2.5 MG/3ML) 0.083% nebulizer solution 5 mg (5 mg Nebulization Given 08/26/16 1926)  vancomycin (VANCOCIN) IVPB 1000 mg/200 mL premix (1,000 mg Intravenous New Bag/Given 08/26/16 2217)  ceFEPIme (MAXIPIME) 2 g in dextrose 5 % 50 mL IVPB (0 g Intravenous Stopped 08/26/16 2216)     Initial Impression / Assessment and Plan / ED Course  I have reviewed the triage vital signs and the nursing notes.  Pertinent labs & imaging results that were available during my care of the patient were reviewed by me and considered in my medical decision making (see chart for details).     Possibly severe asthma exacerbation again as it did improve with albuterol/mag and steroids however with er pattern of recurrent pneumonias over last few months I am more worried for one of the COP or BOOP. Will plan for admission for further management and workup. vanc/cefepime given.   Final Clinical Impressions(s) / ED Diagnoses   Final diagnoses:  Acute respiratory failure with hypoxia (HCC)      Amaree Leeper, Barbara Cower, MD 08/26/16 2321

## 2016-08-26 NOTE — Progress Notes (Signed)
Placed humidity on pts. oxygen which is currently set at 3 lpm with sats at 93-94% with extension tubing placed for use to/from bathroom, Sputum cup labelled, placed in room per order, RN made aware, RT to monitor.

## 2016-08-26 NOTE — ED Notes (Signed)
Asked for urine  

## 2016-08-27 DIAGNOSIS — Z720 Tobacco use: Secondary | ICD-10-CM

## 2016-08-27 DIAGNOSIS — K219 Gastro-esophageal reflux disease without esophagitis: Secondary | ICD-10-CM

## 2016-08-27 DIAGNOSIS — J9601 Acute respiratory failure with hypoxia: Secondary | ICD-10-CM

## 2016-08-27 DIAGNOSIS — F1011 Alcohol abuse, in remission: Secondary | ICD-10-CM

## 2016-08-27 DIAGNOSIS — E871 Hypo-osmolality and hyponatremia: Secondary | ICD-10-CM

## 2016-08-27 DIAGNOSIS — J45901 Unspecified asthma with (acute) exacerbation: Secondary | ICD-10-CM

## 2016-08-27 DIAGNOSIS — I10 Essential (primary) hypertension: Secondary | ICD-10-CM

## 2016-08-27 DIAGNOSIS — R739 Hyperglycemia, unspecified: Secondary | ICD-10-CM

## 2016-08-27 HISTORY — DX: Hypo-osmolality and hyponatremia: E87.1

## 2016-08-27 LAB — GLUCOSE, CAPILLARY
GLUCOSE-CAPILLARY: 107 mg/dL — AB (ref 65–99)
Glucose-Capillary: 180 mg/dL — ABNORMAL HIGH (ref 65–99)

## 2016-08-27 LAB — CBC
HCT: 38.2 % (ref 36.0–46.0)
HEMOGLOBIN: 12 g/dL (ref 12.0–15.0)
MCH: 26.1 pg (ref 26.0–34.0)
MCHC: 31.4 g/dL (ref 30.0–36.0)
MCV: 83 fL (ref 78.0–100.0)
Platelets: 215 10*3/uL (ref 150–400)
RBC: 4.6 MIL/uL (ref 3.87–5.11)
RDW: 15.4 % (ref 11.5–15.5)
WBC: 10 10*3/uL (ref 4.0–10.5)

## 2016-08-27 LAB — BASIC METABOLIC PANEL
ANION GAP: 10 (ref 5–15)
BUN: 12 mg/dL (ref 6–20)
CALCIUM: 8.6 mg/dL — AB (ref 8.9–10.3)
CHLORIDE: 103 mmol/L (ref 101–111)
CO2: 21 mmol/L — AB (ref 22–32)
Creatinine, Ser: 0.65 mg/dL (ref 0.44–1.00)
GFR calc non Af Amer: >60 mL/min (ref >60)
Glucose, Bld: 215 mg/dL — ABNORMAL HIGH (ref 65–99)
Potassium: 4.4 mmol/L (ref 3.5–5.1)
Sodium: 134 mmol/L — ABNORMAL LOW (ref 135–145)

## 2016-08-27 LAB — PREGNANCY, URINE: Preg Test, Ur: NEGATIVE

## 2016-08-27 LAB — STREP PNEUMONIAE URINARY ANTIGEN: Strep Pneumo Urinary Antigen: NEGATIVE

## 2016-08-27 LAB — EXPECTORATED SPUTUM ASSESSMENT W REFEX TO RESP CULTURE

## 2016-08-27 LAB — EXPECTORATED SPUTUM ASSESSMENT W GRAM STAIN, RFLX TO RESP C

## 2016-08-27 MED ORDER — HEPARIN SODIUM (PORCINE) 5000 UNIT/ML IJ SOLN
5000.0000 [IU] | Freq: Three times a day (TID) | INTRAMUSCULAR | Status: DC
  Administered 2016-08-27 – 2016-09-01 (×13): 5000 [IU] via SUBCUTANEOUS
  Filled 2016-08-27 (×14): qty 1

## 2016-08-27 MED ORDER — VANCOMYCIN HCL IN DEXTROSE 1-5 GM/200ML-% IV SOLN
1000.0000 mg | Freq: Three times a day (TID) | INTRAVENOUS | Status: DC
  Administered 2016-08-27 – 2016-08-28 (×5): 1000 mg via INTRAVENOUS
  Filled 2016-08-27 (×4): qty 200

## 2016-08-27 MED ORDER — INSULIN ASPART 100 UNIT/ML ~~LOC~~ SOLN
0.0000 [IU] | Freq: Three times a day (TID) | SUBCUTANEOUS | Status: DC
  Administered 2016-08-28 (×2): 2 [IU] via SUBCUTANEOUS
  Administered 2016-08-28 – 2016-08-29 (×2): 1 [IU] via SUBCUTANEOUS
  Administered 2016-08-29: 3 [IU] via SUBCUTANEOUS
  Administered 2016-08-29: 2 [IU] via SUBCUTANEOUS
  Administered 2016-08-30: 3 [IU] via SUBCUTANEOUS
  Administered 2016-08-30: 1 [IU] via SUBCUTANEOUS
  Administered 2016-08-30: 2 [IU] via SUBCUTANEOUS
  Administered 2016-08-31: 1 [IU] via SUBCUTANEOUS
  Administered 2016-08-31: 3 [IU] via SUBCUTANEOUS
  Administered 2016-08-31: 5 [IU] via SUBCUTANEOUS
  Administered 2016-09-01: 2 [IU] via SUBCUTANEOUS
  Administered 2016-09-01: 1 [IU] via SUBCUTANEOUS

## 2016-08-27 MED ORDER — NICOTINE 14 MG/24HR TD PT24
14.0000 mg | MEDICATED_PATCH | Freq: Every day | TRANSDERMAL | Status: DC
  Administered 2016-08-27 – 2016-09-01 (×6): 14 mg via TRANSDERMAL
  Filled 2016-08-27 (×6): qty 1

## 2016-08-27 MED ORDER — SODIUM CHLORIDE 0.9 % IV SOLN
INTRAVENOUS | Status: AC
  Administered 2016-08-27: 11:00:00 via INTRAVENOUS

## 2016-08-27 NOTE — Consult Note (Addendum)
Date: 08/27/2016               Patient Name:  Kara [name] MRN: [id number]  DOB: 06/10/1985 Age / Sex: 31 y.o., female   PCP: Daisy Florooss, Charles Alan, MD         Requesting Physician: Dr. Marland McalpineSheikh, Heloise Beechammair Latif, DO    Consulting Reason:  Recurrent admissions for pneumonia      Chief Complaint: shortness or breath and wheezing  History of Present Illness: Ms. 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  Patient was recently admitted on 07/30/2016 for HCAP. She was discharged with Levaquin and prednisone which she completed 1 week ago. She reports 3 days ago she developed shortness of breath and wheezing that has progressed and prompted her to come into the ED. She was also hospitalized in 05/2016 for acute hypoxic respiratory failure secondary to asthma exacerbation and community-acquired pneumonia. Patient reports that prior to February 2018 she has not required hospitalization for her asthma and has never had pneumonia. She has followed with an asthma/allergy specialist for the past 10 years when she was diagnosed with asthma for the first time in her 1020s. She has an appointment on 4/15 with her allergy doctor.  She has recently started establishing care with [facility] last seen on 08/06/2016 and has PFTs scheduled for follow-up. Patient currently takes Singulair and Symbicort.  She uses her albuterol inhaler as needed. Prior to her hospitalization in February 2018 asthma symptoms were well controlled.  She would use her albuterol inhaler as needed up to 3 times a week. She states since discharge from the hospital she's required her inhaler every day and uses a nebulizer machine as well now.  She states that her home environment has not changed in the past several months.  She  denies humidifier, hot tub, or new pets.  She denies difficulty swallowing solids or liquids.  She is a current 1/2 pack a day smoker and has been for the past 5 years.  She states that last time she smoked a cigarette was several weeks ago due to her respiratory status.  Meds: Current Facility-Administered Medications  Medication Dose Route Frequency Provider Last Rate Last Dose  . 0.9 %  sodium chloride infusion   Intravenous Continuous Marguerita MerlesSheikh, Omair McMinnvilleLatif, DO 75 mL/hr at 08/27/16 1046    . acetaminophen (TYLENOL) tablet 650 mg  650 mg Oral Q6H PRN Eduard ClosKakrakandy, Arshad N, MD       Or  . acetaminophen (TYLENOL) suppository 650 mg  650 mg Rectal Q6H PRN Eduard ClosKakrakandy, Arshad N, MD      . albuterol (PROVENTIL) (2.5 MG/3ML) 0.083% nebulizer solution 2.5 mg  2.5 mg Nebulization Q2H PRN Eduard ClosKakrakandy, Arshad N, MD      . budesonide (PULMICORT) nebulizer solution 0.25 mg  0.25 mg Nebulization BID Eduard ClosKakrakandy, Arshad N, MD   0.25 mg at 08/27/16 0730  . ceFEPIme (MAXIPIME) 1 g in dextrose 5 % 50 mL IVPB  1 g Intravenous Q8H Eduard ClosKakrakandy, Arshad N, MD   Stopped at 08/27/16 [phone]  . gabapentin (NEURONTIN) capsule 900 mg  900 mg Oral BID Eduard ClosKakrakandy, Arshad N, MD   900 mg at 08/27/16 [id number]  . guaiFENesin (MUCINEX) 12 hr tablet 1,200 mg  1,200 mg Oral BID Eduard ClosKakrakandy, Arshad N, MD   1,200 mg at 08/27/16 [id number]  .  ipratropium-albuterol (DUONEB) 0.5-2.5 (3) MG/3ML nebulizer solution 3 mL  3 mL Nebulization Q4H Eduard Clos, MD   3 mL at 08/27/16 1121  . methylPREDNISolone sodium succinate (SOLU-MEDROL) 40 mg/mL injection 40 mg  40 mg Intravenous Q12H Eduard Clos, MD   40 mg at 08/27/16 0540  . metoprolol succinate (TOPROL-XL) 24 hr tablet 25 mg  25 mg Oral Daily Eduard Clos, MD   25 mg at 08/27/16 0917  . montelukast (SINGULAIR) tablet 10 mg  10 mg Oral QHS Eduard Clos, MD   10 mg at 08/26/16 2355  . naltrexone (DEPADE) tablet 50 mg  50 mg Oral Daily Eduard Clos, MD   50 mg at  08/27/16 0917  . nicotine (NICODERM CQ - dosed in mg/24 hours) patch 14 mg  14 mg Transdermal Daily Marguerita Merles Latif, DO   14 mg at 08/27/16 1046  . pantoprazole (PROTONIX) EC tablet 40 mg  40 mg Oral Daily Eduard Clos, MD   40 mg at 08/27/16 1610  . QUEtiapine (SEROQUEL) tablet 100 mg  100 mg Oral QHS Eduard Clos, MD   100 mg at 08/26/16 2357  . sertraline (ZOLOFT) tablet 75 mg  75 mg Oral Daily Eduard Clos, MD   75 mg at 08/27/16 9604  . thiamine (VITAMIN B-1) tablet 100 mg  100 mg Oral Daily Eduard Clos, MD   100 mg at 08/27/16 5409  . vancomycin (VANCOCIN) IVPB 1000 mg/200 mL premix  1,000 mg Intravenous [address] [place], [place]   Stopped at 08/27/16 8119    Allergies: Allergies as of 08/26/2016 - Review Complete 08/26/2016  Allergen Reaction Noted  . Peanuts [peanut oil] Hives and Itching 07/06/2014   Past Medical History:  Diagnosis Date  . Alcohol abuse   . Asthma   . Environmental allergies   . GERD (gastroesophageal reflux disease)   . Headache   . Hypertension   . Pancreatitis   . Prolonged QT syndrome   . Syncope and collapse   . Vision abnormalities    Past Surgical History:  Procedure Laterality Date  . ESOPHAGOGASTRODUODENOSCOPY N/A 07/17/2014   Procedure: ESOPHAGOGASTRODUODENOSCOPY (EGD);  Surgeon: Dorena Cookey, MD;  Location: [facility];  Service: Endoscopy;  Laterality: N/A;  . TYMPANOSTOMY TUBE PLACEMENT     Family History  Problem Relation Age of Onset  . Heart failure Mother   . Heart failure Father   . Diabetes Father   . Heart failure Maternal Grandfather    Social History   Social History  . Marital status: Married    Spouse name: [name]  . Number of children: 0  . Years of education: N/A   Occupational History  . College advisor    Social History Main Topics  . Smoking status: Current Every Day Smoker    Packs/day: 1.00    Years: 5.00    Types: Cigarettes    Last attempt to quit: 04/08/2012  .  Smokeless tobacco: Never Used  . Alcohol use No  . Drug use: No  . Sexual activity: Yes    Birth control/ protection: None   Other Topics Concern  . Not on file   Social History Narrative   Married.    Review of Systems: Review of Systems  Constitutional: Negative for chills, fever and malaise/fatigue.  HENT: Negative for sore throat.   Respiratory: Positive for cough, sputum production, shortness of breath and wheezing. Negative for hemoptysis.   Cardiovascular: Negative for chest pain,  palpitations and leg swelling.  Gastrointestinal: Negative for abdominal pain, nausea and vomiting.  Musculoskeletal: Negative for myalgias.     Physical Exam: Blood pressure 120/85, pulse 90, temperature 97.9 F (36.6 C), temperature source Oral, resp. rate 18, height 5\' 5"  (1.651 m), weight 242 lb 8.1 oz (110 kg), last menstrual period 07/27/2016, SpO2 98 %. Vitals:   08/27/16 0440 08/27/16 0730 08/27/16 1122 08/27/16 1312  BP: 115/87   120/85  Pulse: 88   90  Resp: 20   18  Temp: 98.1 F (36.7 C)   97.9 F (36.6 C)  TempSrc: Oral   Oral  SpO2: 97% 95% 98% 98%  Weight:      Height:       General: Vital signs reviewed.  Patient is well-developed and well-nourished, in no acute distress and cooperative with exam.  Head: Normocephalic and atraumatic. Eyes: EOMI, conjunctivae normal, no scleral icterus.  Neck: Supple, trachea midline, normal ROM Cardiovascular: RRR, S1 normal, S2 normal, no murmurs, gallops, or rubs. Pulmonary/Chest: Diffuse wheezing throughout all lung fields.  Scattered rhonci Abdominal: Soft, non-tender, non-distended,  no masses, organomegaly, or guarding present.  Musculoskeletal: No joint deformities, erythema, or stiffness, ROM full and nontender. Extremities: No lower extremity edema bilaterally,  pulses symmetric and intact bilaterally. No cyanosis or clubbing. Neurological: A&O x3, Strength is normal and symmetric bilaterally  Skin: Warm, dry and intact. No  rashes or erythema. Psychiatric: Normal mood and affect. speech and behavior is normal. Cognition and memory are normal.   Lab results: CBC    Component Value Date/Time   WBC 10.0 08/27/2016 0543   RBC 4.60 08/27/2016 0543   HGB 12.0 08/27/2016 0543   HCT 38.2 08/27/2016 0543   PLT 215 08/27/2016 0543   MCV 83.0 08/27/2016 0543   MCH 26.1 08/27/2016 0543   MCHC 31.4 08/27/2016 0543   RDW 15.4 08/27/2016 0543   LYMPHSABS 2.5 08/26/2016 1933   MONOABS 0.7 08/26/2016 1933   EOSABS 1.4 (H) 08/26/2016 1933   BASOSABS 0.1 08/26/2016 1933   BMET    Component Value Date/Time   NA 134 (L) 08/27/2016 0543   NA 142 09/02/2014 1507   K 4.4 08/27/2016 0543   CL 103 08/27/2016 0543   CO2 21 (L) 08/27/2016 0543   GLUCOSE 215 (H) 08/27/2016 0543   BUN 12 08/27/2016 0543   BUN 7 09/02/2014 1507   CREATININE 0.65 08/27/2016 0543   CALCIUM 8.6 (L) 08/27/2016 0543   GFRNONAA >60 08/27/2016 0543   GFRAA >60 08/27/2016 0543     Imaging results:  Dg Chest 2 View  Result Date: 08/26/2016 CLINICAL DATA:  Shortness of breath.  Productive cough. EXAM: CHEST  2 VIEW COMPARISON:  Most recent radiographs 08/01/2016, chest CT 06/13/2016 FINDINGS: Unchanged heart size and mediastinal contours. There are persistent bilateral heterogeneous airspace opacities in a lower lung zone predominant distribution. Assess slightly progressed at the right lung base. Possible trace right pleural effusion. No pneumothorax. No acute osseous abnormalities. IMPRESSION: Persistent bilateral heterogeneous airspace opacities, progressed at the right lung base. In conjunction with prior chest CT, findings can be seen in the setting of pulmonary hemorrhage or interstitial lung disease. Atypical infection could have a similar appearance. Electronically Signed   By: Rubye Oaks M.D.   On: 08/26/2016 20:42    Other results: EKG: Sinus Tachycardia  Assessment, Plan, & Recommendations by Problem: Principal Problem:    Acute respiratory failure with hypoxia (HCC) Active Problems:   Benign essential HTN   Asthma  exacerbation   Alcohol abuse, in remission   HCAP (healthcare-associated pneumonia)  Acute respiratory failure with hypoxia secondary to asthma  Patient was admitted and placed on vancomycin and cefepime, Solu-Medrol and Pulmicort. Sputum cultures and Legionella are pending.  Strep urinary antigen was negative.  On exam patient is breathing comfortably and speaking in complete sentences without difficulty. She has diffuse wheezing and rhonchi throughout all lung fields. Due to chest x-ray, concerned that patient may have hypersensitivity pneumonitis.  Other thoughts on the differential are aspiration vs autoimmune inflammatory processes.  Will do blood work inpatient and have patient follow up with [facility] after discharge for bronchoscopy.  Will need to finish prednisone taper prior to procedure.  Plan -Follow up with [facility] after discharge and after finishing prednisone taper (see in office in 7-10 days) -Hypersenitivity pneumonitis panel  -speech eval for modified barium -Serum IgE -RAST profile -Aspergillosis specific IgE -Sputum: Bacterial/fungal/AFB  Signed: Camelia Phenes, DO  08/27/2016, 1:45 PM   Attending:  I have independently seen and examined the patient with Dr. Carlos American and I agree with the findings from her note.  This is an interesting case of a 31 year old female who's been having repeated cough, wheezing and shortness of breath with an abnormal chest x-ray. This all started with pneumonia back in February. She's been seen by my partner who has evaluated her recently and has plans for a lung function testing in the next few weeks. Unfortunately she was readmitted with cough, wheezing and cough productive of sticky green mucus. She says that whenever she is treated with Levaquin and prednisone her symptoms resolve, but unfortunately she's been  unable to stay off of prednisone for any length of time over the last several weeks.  On exam: Wheezing bilaterally, normal respiratory effort speaking in full sentences no accessory muscle use Cardiovascular regular rate and rhythm no murmurs gallops or rubs Belly soft nontender nondistended Extremities no edema  Chest x-ray images independently reviewed showing patchy ground glass in the bases bilaterally  Compared this image to multiple prior CT scans and chest x-rays from this year which also show a patchy groundglass throughout.  Impression: Recurrent asthma exacerbation Recurrent pneumonia  Discussion: The differential diagnosis here is broad and is either recurrent asthma exacerbations with an eosinophilic type inflammatory process from some yet undiscovered allergen or an ABPA-like syndrome. The differential diagnosis also includes recurrent aspiration, eosinophilic pneumonia less likely pulmonary alveolar proteinosis, recurrent nonspecific interstitial pneumonitis or hypersensitivity pneumonitis.  The next best step would be to perform bronchoscopy today but unfortunately she started been treated with steroids. I agree that she needs to have lung function testing and allergy testing.  Plan: Arrange for lung function testing in the hospital next week Speech evaluation Follow-up with allergy next week Continue prednisone as you were doing See pulmonary in the office next week, after that arrange for bronchoscopy for cell count, fungal culture, potential biopsies Send hypersensitivity pneumonitis profile Send serum IgE Send Aspergillus specific IgE Check sputum for fungus, bacteria, AFB Continue Symbicort as you are doing Continue albuterol as you are doing  Thank you for this interesting consult, we will follow  Heber Jacobus, MD [facility] Pager: [phone] Cell: [phone] After 3pm or if no response, call [phone]

## 2016-08-27 NOTE — Care Management Note (Signed)
Case Management Note  Patient Details  Name: Kara [name] MRN: [id number] Date of Birth: 05/28/1985  Subjective/Objective: 31 y/o f admitted w/Acute resp failure. From home. Hx: smoking. Readmit 4/13-4/16-PNA.                   Action/Plan:d/c plan home.   Expected Discharge Date:                  Expected Discharge Plan:  Home/Self Care  In-House Referral:     Discharge planning Services  CM Consult  Post Acute Care Choice:    Choice offered to:     DME Arranged:    DME Agency:     HH Arranged:    HH Agency:     Status of Service:  In process, will continue to follow  If discussed at Long Length of Stay Meetings, dates discussed:    Additional Comments:  Lanier ClamMahabir, [name], RN 08/27/2016, 2:01 PM

## 2016-08-27 NOTE — Progress Notes (Signed)
Pharmacy Antibiotic Note  Kara [name] is a 31 y.o. female admitted on 08/26/2016 with pneumonia.  Pharmacy has been consulted for Vancomycin dosing.  Plan: Vancomycin 1gm  IV every 8 hours.  Goal trough 15-20 mcg/mL.  Daily Serum Creatinine   Height: 5\' 5"  (165.1 cm) Weight: 242 lb 8.1 oz (110 kg) IBW/kg (Calculated) : 57  Temp (24hrs), Avg:98.8 F (37.1 C), Min:98.1 F (36.7 C), Max:99.4 F (37.4 C)   Recent Labs Lab 08/26/16 1933 08/27/16 0543  WBC 14.4* 10.0  CREATININE 0.72 0.65    Estimated Creatinine Clearance: 124.6 mL/min (by C-G formula based on SCr of 0.65 mg/dL).    Allergies  Allergen Reactions  . Peanuts [Peanut Oil] Hives and Itching    Antimicrobials this admission: Vancomycin 08/26/2016 >> Cefepime 08/26/2016 >>   Dose adjustments this admission: -  Microbiology results: pending  Thank you for allowing pharmacy to be a part of this patient's care.  Kara [name], Kara [name] 08/27/2016 6:24 AM

## 2016-08-27 NOTE — Progress Notes (Signed)
PROGRESS NOTE    Kara [name]  ZOX:[id number] DOB: [birth year] DOA: 08/26/2016 PCP: [name], MD   Brief Narrative:  Kara [name] is a 31 y.o. female with known history of bronchial asthma, ongoing tobacco abuse who presented to the ER with shortness of breath and wheezing over the last 3 days. Patient also states productive cough but denies any hemoptysis. Has been having subjective feeling of fever chills. Denies any chest pain. This is the third admission for the patient is last 2 months for shortness of breath and possible pneumonia. Patient denies any sick contacts or recent travel. Denies any mold in the house or any birds. In the ER patient was found to be wheezing and short of breath but able to complete sentences. Patient also was hypoxic. Chest x-ray shows persistent bilateral infiltrates which was worsening in the right side. Patient states she just recently completed antibiotics and prednisone until then patient was doing fine. Once the course of antibiotics and steroids for over patient started developing wheezing again. She was admitted for Acute Respiratory Failure with Hypoxia along with a Recurrent PNA and Asthma Exacerbation. She was placed on IV Steroids, Nebs, and Abx and Pulmonary was consulted to evaluate for further recommendations.   Assessment & Plan:   Principal Problem:   Acute respiratory failure with hypoxia (HCC) Active Problems:   Hypertension   GERD (gastroesophageal reflux disease)   Benign essential HTN   Asthma exacerbation   Alcohol abuse, in remission   HCAP (healthcare-associated pneumonia)   Tobacco abuse   Respiratory failure with hypoxia (HCC)   Hyponatremia   Hyperglycemia  Acute Hypoxic Respiratory Failure 2/2 Right Lung HCAP and concomitant Asthma Exacerbation, improving - Was found to be hypoxic at 84% on Room Air - Continuous pulse oximetry with nasal cannulaoxygen to keep O2 saturation greater than 92% - Solu-Medrol IV 40 mg every  q12h - Duonebs scheduled q4h and Albuterol Nebs q2hprn shortness of breath/wheezing - Continue Montelukast 10 mg po qHS - Since this is Patient's 3rd Hospitalization Pulmonology was consulted  Health-Care Associated Pneumonia with likely concomitant Severe Asthma Exacerbation -Given IV Magnesium yesterday -CXR 5/10 showed Persistent bilateral heterogeneous airspace opacities, progressed at the right lung base. In conjunction with prior chest CT, findings can be seen in the setting of pulmonary hemorrhage or interstitial lung disease. Atypical infection could have a similar appearance. -Obtain SLP to evaluate for Aspiration  -Patient Denies Hemoptysis  -Check Urine Legionella and Strep Ag -Improved since yesterday. WBC went from 14.4 -> 10.0 -C/w Broad Spectrum Abx with IV Vancomycin and IV Cefepime -Nebs and Steroids as Above -C/w Budesonide 0.25 mg po BID -Appreciate Pulmonary evaluation for recurrent PNA's -Pulmonary recommending Lung Fxn testing in the Hospital Next week along with Pulmonary office visit after Prednisone Taper and arranging outpatient bronchoscopy for cell count, fungal Cx and potential Biopsies. -Recommending also sending Serum IgE, Aspergillus specific IgE, Checking Sputum for Fungus, Bacteria, AFB  Essential Hypertension and Hx of Prolonged QT - Continue Metoprolol Succinate 25 mg po Daily  Alcohol abuse, in remission -Continue Naltrexone 50 mg po Daily and Thiamine 100 mg po Daily  Hyponatremia -Patient's Na+ was 134 this AM -Will start patient on IV NS at 75 mL/hr for 12 hours -Repeat CMP in AM  Tobacco abuse: - Patient reports smoking approximately 1/2 pack of cigarettes per day on average  - Counseled on theneed ofcessation of tobacco use - C/w Nicotine patch 14 mg TD q24h  Anxiety - Continue Sertraline 75 mg  po Daily  Insomnia - Continue Quetiapine 100 mg po Daily qhS  GERD - Pharmacy substitution of Protonix 40 mg po Daily    Hyperglycemia -In the setting of IV Steroid Use -Last HbA1c was 5.6 on 08/01/16 -Will place on Sensitive Novolog SSI AC and monitor CBG's AC HS  DVT prophylaxis: SCDs for Now; Will place on Heparin 5,000 units sq q8h Code Status: FULL CODE Family Communication: No family present at bedside Disposition Plan: Pending further Workup but likely Home  Consultants:   Pulmonary   Procedures: None   Antimicrobials:  Anti-infectives    Start     Dose/Rate Route Frequency Ordered Stop   08/27/16 0630  vancomycin (VANCOCIN) IVPB 1000 mg/200 mL premix     1,000 mg 200 mL/hr over 60 Minutes Intravenous Every 8 hours 08/27/16 0624     08/27/16 0600  ceFEPIme (MAXIPIME) 1 g in dextrose 5 % 50 mL IVPB     1 g 100 mL/hr over 30 Minutes Intravenous Every 8 hours 08/26/16 2229 09/04/16 0559   08/26/16 2130  vancomycin (VANCOCIN) IVPB 1000 mg/200 mL premix     1,000 mg 200 mL/hr over 60 Minutes Intravenous  Once 08/26/16 2118 08/26/16 2317   08/26/16 2130  ceFEPIme (MAXIPIME) 2 g in dextrose 5 % 50 mL IVPB     2 g 100 mL/hr over 30 Minutes Intravenous  Once 08/26/16 2118 08/26/16 2216     Subjective: Seen and examined at bedside and she was resting and she was feeling better. Still a little SOB. No CP or Nausea. Wanted a nicotine patch.   Objective: Vitals:   08/27/16 0440 08/27/16 0730 08/27/16 1122 08/27/16 1312  BP: 115/87   120/85  Pulse: 88   90  Resp: 20   18  Temp: 98.1 F (36.7 C)   97.9 F (36.6 C)  TempSrc: Oral   Oral  SpO2: 97% 95% 98% 98%  Weight:      Height:        Intake/Output Summary (Last 24 hours) at 08/27/16 1519 Last data filed at 08/26/16 2255  Gross per 24 hour  Intake              220 ml  Output                0 ml  Net              220 ml   Filed Weights   08/26/16 2255  Weight: 110 kg (242 lb 8.1 oz)   Examination: Physical Exam:  Constitutional: WN/WD obese Caucasian female, NAD and appears calm  Eyes: Lids and conjunctivae normal,  sclerae anicteric  ENMT: External Ears, Nose appear normal. Grossly normal hearing.  Neck: Appears normal, supple, no cervical masses, normal ROM, no appreciable thyromegaly, no JVD Respiratory: Diminished to auscultation bilaterally with wheezing, no crackles or rales but had some rhonchi. Normal respiratory effort and patient is not tachypenic. No accessory muscle use. Wearing Supplemental O2 via [place] Cardiovascular: RRR, no murmurs / rubs / gallops. S1 and S2 auscultated. No extremity edema.  Abdomen: Soft, non-tender, Distended 2/2 to body habitus. No masses palpated. No appreciable hepatosplenomegaly. Bowel sounds positive x4.  GU: Deferred. Musculoskeletal: No clubbing / cyanosis of digits/nails. No joint deformity upper and lower extremities. Good ROM, no contractures.  Skin: No rashes, lesions, ulcers. No induration; Warm and dry. Has some tattoos noted, one on foot.  Neurologic: CN 2-12 grossly intact with no focal deficits. Sensation intact in all  4 Extremities. Romberg sign cerebellar reflexes not assessed.  Psychiatric: Normal judgment and insight. Alert and oriented x 3. Normal mood and appropriate affect.   Data Reviewed: I have personally reviewed following labs and imaging studies  CBC:  Recent Labs Lab 08/26/16 1933 08/27/16 0543  WBC 14.4* 10.0  NEUTROABS 9.7*  --   HGB 12.9 12.0  HCT 40.5 38.2  MCV 83.5 83.0  PLT 263 215   Basic Metabolic Panel:  Recent Labs Lab 08/26/16 1933 08/27/16 0543  NA 138 134*  K 4.4 4.4  CL 102 103  CO2 27 21*  GLUCOSE 134* 215*  BUN 12 12  CREATININE 0.72 0.65  CALCIUM 8.6* 8.6*   GFR: Estimated Creatinine Clearance: 124.6 mL/min (by C-G formula based on SCr of 0.65 mg/dL). Liver Function Tests:  Recent Labs Lab 08/26/16 1933  AST 26  ALT 11*  ALKPHOS 78  BILITOT 0.9  PROT 7.7  ALBUMIN 4.1   No results for input(s): LIPASE, AMYLASE in the last 168 hours. No results for input(s): AMMONIA in the last 168  hours. Coagulation Profile: No results for input(s): INR, PROTIME in the last 168 hours. Cardiac Enzymes: No results for input(s): CKTOTAL, CKMB, CKMBINDEX, TROPONINI in the last 168 hours. BNP (last 3 results) No results for input(s): PROBNP in the last 8760 hours. HbA1C: No results for input(s): HGBA1C in the last 72 hours. CBG: No results for input(s): GLUCAP in the last 168 hours. Lipid Profile: No results for input(s): CHOL, HDL, LDLCALC, TRIG, CHOLHDL, LDLDIRECT in the last 72 hours. Thyroid Function Tests: No results for input(s): TSH, T4TOTAL, FREET4, T3FREE, THYROIDAB in the last 72 hours. Anemia Panel: No results for input(s): VITAMINB12, FOLATE, FERRITIN, TIBC, IRON, RETICCTPCT in the last 72 hours. Sepsis Labs: No results for input(s): PROCALCITON, LATICACIDVEN in the last 168 hours.  No results found for this or any previous visit (from the past 240 hour(s)).   Radiology Studies: Dg Chest 2 View  Result Date: 08/26/2016 CLINICAL DATA:  Shortness of breath.  Productive cough. EXAM: CHEST  2 VIEW COMPARISON:  Most recent radiographs 08/01/2016, chest CT 06/13/2016 FINDINGS: Unchanged heart size and mediastinal contours. There are persistent bilateral heterogeneous airspace opacities in a lower lung zone predominant distribution. Assess slightly progressed at the right lung base. Possible trace right pleural effusion. No pneumothorax. No acute osseous abnormalities. IMPRESSION: Persistent bilateral heterogeneous airspace opacities, progressed at the right lung base. In conjunction with prior chest CT, findings can be seen in the setting of pulmonary hemorrhage or interstitial lung disease. Atypical infection could have a similar appearance. Electronically Signed   By: Rubye OaksMelanie  Ehinger M.D.   On: 08/26/2016 20:42   Scheduled Meds: . budesonide (PULMICORT) nebulizer solution  0.25 mg Nebulization BID  . gabapentin  900 mg Oral BID  . guaiFENesin  1,200 mg Oral BID  . heparin  subcutaneous  5,000 Units Subcutaneous Q8H  . insulin aspart  0-9 Units Subcutaneous TID WC  . ipratropium-albuterol  3 mL Nebulization Q4H  . methylPREDNISolone (SOLU-MEDROL) injection  40 mg Intravenous Q12H  . metoprolol succinate  25 mg Oral Daily  . montelukast  10 mg Oral QHS  . naltrexone  50 mg Oral Daily  . nicotine  14 mg Transdermal Daily  . pantoprazole  40 mg Oral Daily  . QUEtiapine  100 mg Oral QHS  . sertraline  75 mg Oral Daily  . thiamine  100 mg Oral Daily   Continuous Infusions: . sodium chloride 75 mL/hr at  08/27/16 1046  . ceFEPime (MAXIPIME) IV 1 g (08/27/16 1459)  . vancomycin Stopped (08/27/16 0802)    LOS: 1 day   Merlene Laughter, DO [facility] Pager [phone]  If 7PM-7AM, please contact night-coverage [email or web address] Password [facility] 08/27/2016, 3:19 PM

## 2016-08-28 ENCOUNTER — Inpatient Hospital Stay (HOSPITAL_COMMUNITY): Payer: BC Managed Care – PPO

## 2016-08-28 LAB — COMPREHENSIVE METABOLIC PANEL
ALK PHOS: 66 U/L (ref 38–126)
ALT: 11 U/L — AB (ref 14–54)
AST: 15 U/L (ref 15–41)
Albumin: 3.1 g/dL — ABNORMAL LOW (ref 3.5–5.0)
Anion gap: 6 (ref 5–15)
BILIRUBIN TOTAL: 0.1 mg/dL — AB (ref 0.3–1.2)
BUN: 16 mg/dL (ref 6–20)
CHLORIDE: 107 mmol/L (ref 101–111)
CO2: 25 mmol/L (ref 22–32)
CREATININE: 0.6 mg/dL (ref 0.44–1.00)
Calcium: 8.7 mg/dL — ABNORMAL LOW (ref 8.9–10.3)
GFR calc non Af Amer: >60 mL/min (ref >60)
Glucose, Bld: 201 mg/dL — ABNORMAL HIGH (ref 65–99)
Potassium: 4.1 mmol/L (ref 3.5–5.1)
SODIUM: 138 mmol/L (ref 135–145)
Total Protein: 6.3 g/dL — ABNORMAL LOW (ref 6.5–8.1)

## 2016-08-28 LAB — CBC WITH DIFFERENTIAL/PLATELET
Basophils Absolute: 0 10*3/uL (ref 0.0–0.1)
Basophils Relative: 0 %
EOS ABS: 0 10*3/uL (ref 0.0–0.7)
Eosinophils Relative: 0 %
HEMATOCRIT: 36 % (ref 36.0–46.0)
HEMOGLOBIN: 11.2 g/dL — AB (ref 12.0–15.0)
LYMPHS ABS: 1.8 10*3/uL (ref 0.7–4.0)
LYMPHS PCT: 12 %
MCH: 26.1 pg (ref 26.0–34.0)
MCHC: 31.1 g/dL (ref 30.0–36.0)
MCV: 83.9 fL (ref 78.0–100.0)
MONOS PCT: 5 %
Monocytes Absolute: 0.7 10*3/uL (ref 0.1–1.0)
NEUTROS ABS: 11.9 10*3/uL — AB (ref 1.7–7.7)
NEUTROS PCT: 83 %
Platelets: 232 10*3/uL (ref 150–400)
RBC: 4.29 MIL/uL (ref 3.87–5.11)
RDW: 15.7 % — ABNORMAL HIGH (ref 11.5–15.5)
WBC: 14.5 10*3/uL — AB (ref 4.0–10.5)

## 2016-08-28 LAB — GLUCOSE, CAPILLARY
GLUCOSE-CAPILLARY: 150 mg/dL — AB (ref 65–99)
GLUCOSE-CAPILLARY: 151 mg/dL — AB (ref 65–99)
GLUCOSE-CAPILLARY: 154 mg/dL — AB (ref 65–99)
Glucose-Capillary: 180 mg/dL — ABNORMAL HIGH (ref 65–99)

## 2016-08-28 LAB — MAGNESIUM: Magnesium: 1.7 mg/dL (ref 1.7–2.4)

## 2016-08-28 LAB — LEGIONELLA PNEUMOPHILA SEROGP 1 UR AG: L. PNEUMOPHILA SEROGP 1 UR AG: NEGATIVE

## 2016-08-28 LAB — VANCOMYCIN, TROUGH: VANCOMYCIN TR: 20 ug/mL (ref 15–20)

## 2016-08-28 LAB — PHOSPHORUS: PHOSPHORUS: 2.3 mg/dL — AB (ref 2.5–4.6)

## 2016-08-28 MED ORDER — SODIUM CHLORIDE 0.9 % IV SOLN
1250.0000 mg | Freq: Two times a day (BID) | INTRAVENOUS | Status: DC
  Administered 2016-08-29 – 2016-08-30 (×3): 1250 mg via INTRAVENOUS
  Filled 2016-08-28 (×4): qty 1250

## 2016-08-28 MED ORDER — IPRATROPIUM-ALBUTEROL 0.5-2.5 (3) MG/3ML IN SOLN
3.0000 mL | Freq: Four times a day (QID) | RESPIRATORY_TRACT | Status: DC
  Administered 2016-08-29 – 2016-09-01 (×13): 3 mL via RESPIRATORY_TRACT
  Filled 2016-08-28 (×13): qty 3

## 2016-08-28 MED ORDER — METHYLPREDNISOLONE SODIUM SUCC 125 MG IJ SOLR
60.0000 mg | Freq: Four times a day (QID) | INTRAMUSCULAR | Status: DC
  Administered 2016-08-28 – 2016-08-30 (×8): 60 mg via INTRAVENOUS
  Filled 2016-08-28 (×8): qty 2

## 2016-08-28 NOTE — Progress Notes (Signed)
PROGRESS NOTE    Kara [name]  ZOX:[id number] DOB: 04-21-1985 DOA: 08/26/2016 PCP: [name], MD   Brief Narrative:  Kara [name] is a 31 y.o. female with known history of bronchial asthma, ongoing tobacco abuse who presented to the ER with shortness of breath and wheezing over the last 3 days. Patient also states productive cough but denies any hemoptysis. Has been having subjective feeling of fever chills. Denies any chest pain. This is the third admission for the patient is last 2 months for shortness of breath and possible pneumonia. Patient denies any sick contacts or recent travel. Denies any mold in the house or any birds. In the ER patient was found to be wheezing and short of breath but able to complete sentences. Patient also was hypoxic. Chest x-ray shows persistent bilateral infiltrates which was worsening in the right side. Patient states she just recently completed antibiotics and prednisone until then patient was doing fine. Once the course of antibiotics and steroids for over patient started developing wheezing again. She was admitted for Acute Respiratory Failure with Hypoxia along with a Recurrent PNA and Asthma Exacerbation. She was placed on IV Steroids, Nebs, and Abx and Pulmonary was consulted to evaluate for further recommendations. Steroids increased as patient was wheezing more today.   Assessment & Plan:   Principal Problem:   Acute respiratory failure with hypoxia (HCC) Active Problems:   Hypertension   GERD (gastroesophageal reflux disease)   Benign essential HTN   Asthma exacerbation   Alcohol abuse, in remission   HCAP (healthcare-associated pneumonia)   Tobacco abuse   Respiratory failure with hypoxia (HCC)   Hyponatremia   Hyperglycemia  Acute Hypoxic Respiratory Failure 2/2 Right Lung HCAP and concomitant Asthma Exacerbation, improving - Was found to be hypoxic at 84% on Room Air - Continuous pulse oximetry with nasal cannulaoxygen to keep O2  saturation greater than 92%; Still wearing O2 via [place] 2 Liters - Solu-Medrol IV 40 mg every q12h increased to IV 60 mg q6h - Duonebs scheduled q4h and Albuterol Nebs q2hprn shortness of breath/wheezing - Continue Montelukast 10 mg po qHS - Since this is Patient's 3rd Hospitalization Pulmonology was consulted -Pulmonary Recc's As below  Health-Care Associated Pneumonia with likely concomitant Severe Asthma Exacerbation -Given IV Magnesium on admission -CXR 5/10 showed Persistent bilateral heterogeneous airspace opacities, progressed at the right lung base. In conjunction with prior chest CT, findings can be seen in the setting of pulmonary hemorrhage or interstitial lung disease. Atypical infection could have a similar appearance. -Obtained SLP to evaluate for Aspiration and SLP Clinician feels like it is very unlikely the patient's PNA's were caused by Aspiration from foods and liquids  -Patient Denies Hemoptysis  -Checked Urine Legionella and Strep Ag and were Negative -WBC went from 14.4 -> 10.0 -> 14.5 (Likely from Steroid Demargination) -C/w Broad Spectrum Abx with IV Vancomycin and IV Cefepime -Nebs and Steroids as Above -C/w Budesonide 0.25 mg po BID -Appreciate Pulmonary evaluation for recurrent PNA's -Pulmonary recommending Lung Fxn testing in the Hospital Next week along with Pulmonary office visit after Prednisone Taper and arranging outpatient bronchoscopy for cell count, fungal Cx and potential Biopsies. -Recommending also sending Serum IgE, Aspergillus specific IgE, Checking Sputum for Fungus, Bacteria, AFB as well as Hypersensitivy Pneumonitis Profile and RAST Profile -Sputum Gram Stain done and showed FEW WBC PRESENT,BOTH PMN AND MONONUCLEAR, FEW SQUAMOUS EPITHELIAL CELLS PRESENT, FEW GRAM POSITIVE COCCI IN PAIRS IN CLUSTERS  RARE GRAM POSITIVE RODS,RARE BUDDING YEAST SEEN and Cx Pending  Essential Hypertension and Hx of Prolonged QT - Continue Metoprolol Succinate 25 mg po  Daily  Alcohol abuse, in remission -Continue Naltrexone 50 mg po Daily and Thiamine 100 mg po Daily  Hyponatremia -Patient's Na+ was 134 and improved to 138 -Was on IV NS at 75 mL/hr for 12 hours -Repeat CMP in AM  Tobacco abuse: - Patient reports smoking approximately 1/2 pack of cigarettes per day on average  - Counseled on theneed ofcessation of tobacco use - C/w Nicotine patch 14 mg TD q24h  Anxiety - Continue Sertraline 75 mg po Daily  Insomnia - Continue Quetiapine 100 mg po Daily qhS  GERD - Pharmacy substitution of Protonix 40 mg po Daily   Hyperglycemia -In the setting of IV Steroid Use -Last HbA1c was 5.6 on 08/01/16 -Will place on Sensitive Novolog SSI AC and monitor CBG's AC HS -CBG's ranging from 150-180  DVT prophylaxis: SCDs and Heparin 5,000 units sq q8h Code Status: FULL CODE Family Communication: No family present at bedside Disposition Plan: Pending further Workup but likely Home  Consultants:   Pulmonary Dr. Landis Gandy    Procedures: None   Antimicrobials:  Anti-infectives    Start     Dose/Rate Route Frequency Ordered Stop   08/29/16 0200  vancomycin (VANCOCIN) 1,250 mg in sodium chloride 0.9 % 250 mL IVPB     1,250 mg 166.7 mL/hr over 90 Minutes Intravenous Every 12 hours 08/28/16 1424     08/27/16 0630  vancomycin (VANCOCIN) IVPB 1000 mg/200 mL premix  Status:  Discontinued     1,000 mg 200 mL/hr over 60 Minutes Intravenous Every 8 hours 08/27/16 0624 08/28/16 1424   08/27/16 0600  ceFEPIme (MAXIPIME) 1 g in dextrose 5 % 50 mL IVPB     1 g 100 mL/hr over 30 Minutes Intravenous Every 8 hours 08/26/16 2229 09/04/16 0559   08/26/16 2130  vancomycin (VANCOCIN) IVPB 1000 mg/200 mL premix     1,000 mg 200 mL/hr over 60 Minutes Intravenous  Once 08/26/16 2118 08/26/16 2317   08/26/16 2130  ceFEPIme (MAXIPIME) 2 g in dextrose 5 % 50 mL IVPB     2 g 100 mL/hr over 30 Minutes Intravenous  Once 08/26/16 2118 08/26/16 2216      Subjective: Seen and examined and felt as if she was wheezing more today. No Nausea or vomiting. States she felt ok but not tremendously better.   Objective: Vitals:   08/28/16 0935 08/28/16 0936 08/28/16 1300 08/28/16 1357  BP:   122/63   Pulse:   89   Resp:   20   Temp:   98.6 F (37 C)   TempSrc:   Oral   SpO2: 98% 98% 97% 98%  Weight:      Height:        Intake/Output Summary (Last 24 hours) at 08/28/16 1654 Last data filed at 08/28/16 1155  Gross per 24 hour  Intake              100 ml  Output                0 ml  Net              100 ml   Filed Weights   08/26/16 2255  Weight: 110 kg (242 lb 8.1 oz)   Examination: Physical Exam:  Constitutional: Morbidly obese Caucasian young female in mild Respiratory distress appears calm Eyes: Lids normal; Scleare anicteric.  ENMT: Normal appearing Ears and Nose; Mucous membranes  moist. Neck: Supple and no JVD or visible masses Respiratory: Diminished with wheezing and mild rhonchi. Patient slightly tachypenic. Not using any accessory muscles to breathe but is wearing O2 via [place].  Cardiovascular: RRR; No m/r/g; No lower extremity edema Abdomen: Soft Distended due to body habitus. Non tender and bowel sounds present.  GU: Deferred Musculoskeletal: No Cyanosis and No contractures Skin: Warm and dry with no rashes or lesions. Has some tattoos Neurologic: CN 2-12 grossly intact and has no focal deficits.  Psychiatric: Pleasant mood and affect. A and O x3. Appropriate insight and judgement.   Data Reviewed: I have personally reviewed following labs and imaging studies  CBC:  Recent Labs Lab 08/26/16 1933 08/27/16 0543 08/28/16 0621  WBC 14.4* 10.0 14.5*  NEUTROABS 9.7*  --  11.9*  HGB 12.9 12.0 11.2*  HCT 40.5 38.2 36.0  MCV 83.5 83.0 83.9  PLT 263 215 232   Basic Metabolic Panel:  Recent Labs Lab 08/26/16 1933 08/27/16 0543 08/28/16 0621  NA 138 134* 138  K 4.4 4.4 4.1  CL 102 103 107  CO2 27 21* 25   GLUCOSE 134* 215* 201*  BUN 12 12 16   CREATININE 0.72 0.65 0.60  CALCIUM 8.6* 8.6* 8.7*  MG  --   --  1.7  PHOS  --   --  2.3*   GFR: Estimated Creatinine Clearance: 124.6 mL/min (by C-G formula based on SCr of 0.6 mg/dL). Liver Function Tests:  Recent Labs Lab 08/26/16 1933 08/28/16 0621  AST 26 15  ALT 11* 11*  ALKPHOS 78 66  BILITOT 0.9 0.1*  PROT 7.7 6.3*  ALBUMIN 4.1 3.1*   No results for input(s): LIPASE, AMYLASE in the last 168 hours. No results for input(s): AMMONIA in the last 168 hours. Coagulation Profile: No results for input(s): INR, PROTIME in the last 168 hours. Cardiac Enzymes: No results for input(s): CKTOTAL, CKMB, CKMBINDEX, TROPONINI in the last 168 hours. BNP (last 3 results) No results for input(s): PROBNP in the last 8760 hours. HbA1C: No results for input(s): HGBA1C in the last 72 hours. CBG:  Recent Labs Lab 08/27/16 1759 08/27/16 2101 08/28/16 0729 08/28/16 1151 08/28/16 1642  GLUCAP 107* 180* 180* 150* 151*   Lipid Profile: No results for input(s): CHOL, HDL, LDLCALC, TRIG, CHOLHDL, LDLDIRECT in the last 72 hours. Thyroid Function Tests: No results for input(s): TSH, T4TOTAL, FREET4, T3FREE, THYROIDAB in the last 72 hours. Anemia Panel: No results for input(s): VITAMINB12, FOLATE, FERRITIN, TIBC, IRON, RETICCTPCT in the last 72 hours. Sepsis Labs: No results for input(s): PROCALCITON, LATICACIDVEN in the last 168 hours.  Recent Results (from the past 240 hour(s))  Culture, sputum-assessment     Status: None   Collection Time: 08/27/16  6:03 PM  Result Value Ref Range Status   Specimen Description SPUTUM  Final   Special Requests NONE  Final   Sputum evaluation THIS SPECIMEN IS ACCEPTABLE FOR SPUTUM CULTURE  Final   Report Status 08/27/2016 FINAL  Final  Culture, respiratory (NON-Expectorated)     Status: None (Preliminary result)   Collection Time: 08/27/16  6:03 PM  Result Value Ref Range Status   Specimen Description SPUTUM   Final   Special Requests NONE Reflexed from [id number]  Final   Gram Stain   Final    FEW WBC PRESENT,BOTH PMN AND MONONUCLEAR FEW SQUAMOUS EPITHELIAL CELLS PRESENT FEW GRAM POSITIVE COCCI IN PAIRS IN CLUSTERS RARE GRAM POSITIVE RODS RARE BUDDING YEAST SEEN    Culture   Final  CULTURE REINCUBATED FOR BETTER GROWTH Performed at [facility], [address] [place], [place] [address]    Report Status PENDING  Incomplete     Radiology Studies: Dg Chest 2 View  Result Date: 08/26/2016 CLINICAL DATA:  Shortness of breath.  Productive cough. EXAM: CHEST  2 VIEW COMPARISON:  Most recent radiographs 08/01/2016, chest CT 06/13/2016 FINDINGS: Unchanged heart size and mediastinal contours. There are persistent bilateral heterogeneous airspace opacities in a lower lung zone predominant distribution. Assess slightly progressed at the right lung base. Possible trace right pleural effusion. No pneumothorax. No acute osseous abnormalities. IMPRESSION: Persistent bilateral heterogeneous airspace opacities, progressed at the right lung base. In conjunction with prior chest CT, findings can be seen in the setting of pulmonary hemorrhage or interstitial lung disease. Atypical infection could have a similar appearance. Electronically Signed   By: Rubye OaksMelanie  Ehinger M.D.   On: 08/26/2016 20:42   Dg Swallowing Func-speech Pathology  Result Date: 08/28/2016 Objective Swallowing Evaluation: Type of Study: MBS-Modified Barium Swallow Study Patient Details Name: Karma Lewmanda Sermersheim MRN: [id number] Date of Birth: 02/28/1986 Today's Date: 08/28/2016 Time: SLP Start Time (ACUTE ONLY): 1455-SLP Stop Time (ACUTE ONLY): 1505 SLP Time Calculation (min) (ACUTE ONLY): 10 min Past Medical History: Past Medical History: Diagnosis Date . Alcohol abuse  . Asthma  . Environmental allergies  . GERD (gastroesophageal reflux disease)  . Headache  . Hypertension  . Pancreatitis  . Prolonged QT syndrome  . Syncope and collapse  . Vision  abnormalities  Past Surgical History: Past Surgical History: Procedure Laterality Date . ESOPHAGOGASTRODUODENOSCOPY N/A 07/17/2014  Procedure: ESOPHAGOGASTRODUODENOSCOPY (EGD);  Surgeon: Dorena CookeyJohn Hayes, MD;  [facility];  Service: Endoscopy;  Laterality: N/A; . TYMPANOSTOMY TUBE PLACEMENT   HPI: Patient is a 31 y.o. female with PMH: bronchial asthma, ongoing tobacco abuse, alcohol abuse, GERD, pancreatitis, HTN, headache, syncope and collapse, environmental allergies. She has been admitted two other times in the past two months for SOB and possible PNA. CXR revealed bilateral infiltrates, worsening on the right side. MD orderded MBS to r/o aspiration as the cause of patient's recurrent PNA's. Subjective: pleasant, cooperative, no complaints Assessment / Plan / Recommendation CHL IP CLINICAL IMPRESSIONS 08/28/2016 Clinical Impression Patient presents with an oropharyngeal swallow [facility]. She exhibited one instance of flash penetration with large volume sip of thin liquids from cup, but did not exhibit any other incidence of penetration or aspiration. Patient with timely swallow initiation, full clearance of all tested boluses (puree, hard solid, pill, thin liquids) with no residuals remaining in laryngeal vestibule post swallow. As per esophageal sweep, pill transited upper esophagus without difficulty. Based on this study, SLP's clinician judgement is that it would be very unlikely that patient's PNA's were caused by aspiration from foods and liquids she eats. Although no evidence of retrograde movement of liquids or solids was observed during this study, cannot r/o possibility of aspiration from reflux, but this seems unlikely. SLP Visit Diagnosis Dysphagia, pharyngeal phase (R13.13) Attention and concentration deficit following -- Frontal lobe and executive function deficit following -- Impact on safety and function No limitations   CHL IP TREATMENT RECOMMENDATION 08/28/2016 Treatment  Recommendations No treatment recommended at this time   No flowsheet data found. CHL IP DIET RECOMMENDATION 08/28/2016 SLP Diet Recommendations Regular solids;Thin liquid Liquid Administration via Cup;Straw Medication Administration Whole meds with liquid Compensations -- Postural Changes Seated upright at 90 degrees   No flowsheet data found.  CHL IP FOLLOW UP RECOMMENDATIONS 08/28/2016 Follow up Recommendations  None   No flowsheet data found.     CHL IP ORAL PHASE 08/28/2016 Oral Phase WFL Oral - Pudding Teaspoon -- Oral - Pudding Cup -- Oral - Honey Teaspoon -- Oral - Honey Cup -- Oral - Nectar Teaspoon -- Oral - Nectar Cup -- Oral - Nectar Straw -- Oral - Thin Teaspoon -- Oral - Thin Cup -- Oral - Thin Straw -- Oral - Puree -- Oral - Mech Soft -- Oral - Regular -- Oral - Multi-Consistency -- Oral - Pill -- Oral Phase - Comment --  CHL IP PHARYNGEAL PHASE 08/28/2016 Pharyngeal Phase Impaired Pharyngeal- Pudding Teaspoon -- Pharyngeal -- Pharyngeal- Pudding Cup -- Pharyngeal -- Pharyngeal- Honey Teaspoon -- Pharyngeal -- Pharyngeal- Honey Cup -- Pharyngeal -- Pharyngeal- Nectar Teaspoon -- Pharyngeal -- Pharyngeal- Nectar Cup -- Pharyngeal -- Pharyngeal- Nectar Straw -- Pharyngeal -- Pharyngeal- Thin Teaspoon -- Pharyngeal -- Pharyngeal- Thin Cup Reduced airway/laryngeal closure Pharyngeal Material enters airway, remains ABOVE vocal cords then ejected out Pharyngeal- Thin Straw -- Pharyngeal -- Pharyngeal- Puree WFL Pharyngeal -- Pharyngeal- Mechanical Soft NT Pharyngeal -- Pharyngeal- Regular WFL Pharyngeal -- Pharyngeal- Multi-consistency NT Pharyngeal -- Pharyngeal- Pill WFL Pharyngeal -- Pharyngeal Comment Only one instance of flash penetration with large sip of thin liquids  CHL IP CERVICAL ESOPHAGEAL PHASE 08/28/2016 Cervical Esophageal Phase WFL Pudding Teaspoon -- Pudding Cup -- Honey Teaspoon -- Honey Cup -- Nectar Teaspoon -- Nectar Cup -- Nectar Straw -- Thin Teaspoon -- Thin Cup -- Thin Straw -- Puree --  Mechanical Soft -- Regular -- Multi-consistency -- Pill -- Cervical Esophageal Comment -- Angela Nevin, MA, CCC-SLP 08/28/16 3:33 PM              Scheduled Meds: . budesonide (PULMICORT) nebulizer solution  0.25 mg Nebulization BID  . gabapentin  900 mg Oral BID  . guaiFENesin  1,200 mg Oral BID  . heparin subcutaneous  5,000 Units Subcutaneous Q8H  . insulin aspart  0-9 Units Subcutaneous TID WC  . ipratropium-albuterol  3 mL Nebulization Q4H  . methylPREDNISolone (SOLU-MEDROL) injection  40 mg Intravenous Q12H  . metoprolol succinate  25 mg Oral Daily  . montelukast  10 mg Oral QHS  . naltrexone  50 mg Oral Daily  . nicotine  14 mg Transdermal Daily  . pantoprazole  40 mg Oral Daily  . QUEtiapine  100 mg Oral QHS  . sertraline  75 mg Oral Daily  . thiamine  100 mg Oral Daily   Continuous Infusions: . ceFEPime (MAXIPIME) IV Stopped (08/28/16 1350)  . [START ON 08/29/2016] vancomycin      LOS: 2 days   Merlene Laughter, DO [facility] Pager [phone]  If 7PM-7AM, please contact night-coverage [email or web address] Password [facility] 08/28/2016, 4:54 PM

## 2016-08-28 NOTE — Progress Notes (Signed)
Modified Barium Swallow Progress Note  Patient Details  Name: Karma Lewmanda Nicholes MRN: [id number] Date of Birth: 10/25/1985  Today's Date: 08/28/2016  Modified Barium Swallow completed.  Full report located under Chart Review in the Imaging Section.  Brief recommendations include the following:  Clinical Impression  Patient presents with an oropharyngeal swallow that [facility]. She exhibited one instance of flash penetration with large volume sip of thin liquids from cup, but did not exhibit any other incidence of penetration or aspiration. Patient with timely swallow initiation, full clearance of all tested boluses (puree, hard solid, pill, thin liquids) with no residuals remaining in laryngeal vestibule post swallow. As per esophageal sweep, pill transited upper esophagus without difficulty. Based on this study, SLP's clinician judgement is that it would be very unlikely that patient's PNA's were caused by aspiration from foods and liquids she eats. Although no evidence of retrograde movement of liquids or solids was observed during this study, cannot r/o possibility of aspiration from reflux, but this seems unlikely.   Swallow Evaluation Recommendations       SLP Diet Recommendations: Regular solids;Thin liquid   Liquid Administration via: Cup;Straw   Medication Administration: Whole meds with liquid   Supervision: Patient able to self feed       Postural Changes: Seated upright at 90 degrees     Angela NevinJohn T. Preston, MA, CCC-SLP 08/28/16 3:35 PM

## 2016-08-28 NOTE — Progress Notes (Signed)
Pharmacy Antibiotic Note  Kara [name] is a 31 y.o. female admitted on 08/26/2016 with pneumonia.  Pharmacy has been consulted for Vancomycin dosing.  08/28/2016 VT=20 on 1gm q8h, goal trough 15-20 mcg/ml WBC 14.5 on methylpred Afebrile Scr 0.6, CrCl>13500mls/min  Plan: Decrease vancomycin to 1250mg  IV q12h Continue cefepime 1gm IV q8h per MD Daily Scr vanc trough at steady state   Height: 5\' 5"  (165.1 cm) Weight: 242 lb 8.1 oz (110 kg) IBW/kg (Calculated) : 57  Temp (24hrs), Avg:98.1 F (36.7 C), Min:97.8 F (36.6 C), Max:98.6 F (37 C)   Recent Labs Lab 08/26/16 1933 08/27/16 0543 08/28/16 0621 08/28/16 1336  WBC 14.4* 10.0 14.5*  --   CREATININE 0.72 0.65 0.60  --   VANCOTROUGH  --   --   --  20    Estimated Creatinine Clearance: 124.6 mL/min (by C-G formula based on SCr of 0.6 mg/dL).    Allergies  Allergen Reactions  . Peanuts [Peanut Oil] Hives and Itching    Antimicrobials this admission: Vancomycin 08/26/2016 >> Cefepime 08/26/2016 >>   Dose adjustments this admission: 5/12 1330 VT =20 on 1gm IV q8h  Microbiology results: pending  Thank you for allowing pharmacy to be a part of this patient's care.  Arley PhenixEllen Maresha Anastos RPh 08/28/2016, 2:22 PM Pager [phone]

## 2016-08-29 LAB — GLUCOSE, CAPILLARY
Glucose-Capillary: 173 mg/dL — ABNORMAL HIGH (ref 65–99)
Glucose-Capillary: 198 mg/dL — ABNORMAL HIGH (ref 65–99)
Glucose-Capillary: 213 mg/dL — ABNORMAL HIGH (ref 65–99)
Glucose-Capillary: 212 mg/dL — ABNORMAL HIGH (ref 65–99)

## 2016-08-29 LAB — CBC WITH DIFFERENTIAL/PLATELET
Basophils Absolute: 0 K/uL (ref 0.0–0.1)
Basophils Relative: 0 %
Eosinophils Absolute: 0 K/uL (ref 0.0–0.7)
Eosinophils Relative: 0 %
HCT: 38 % (ref 36.0–46.0)
Hemoglobin: 11.9 g/dL — ABNORMAL LOW (ref 12.0–15.0)
Lymphocytes Relative: 10 %
Lymphs Abs: 1 K/uL (ref 0.7–4.0)
MCH: 26.4 pg (ref 26.0–34.0)
MCHC: 31.3 g/dL (ref 30.0–36.0)
MCV: 84.4 fL (ref 78.0–100.0)
Monocytes Absolute: 0.3 K/uL (ref 0.1–1.0)
Monocytes Relative: 3 %
Neutro Abs: 8.5 K/uL — ABNORMAL HIGH (ref 1.7–7.7)
Neutrophils Relative %: 87 %
Platelets: 232 K/uL (ref 150–400)
RBC: 4.5 MIL/uL (ref 3.87–5.11)
RDW: 15.3 % (ref 11.5–15.5)
WBC: 9.7 K/uL (ref 4.0–10.5)

## 2016-08-29 LAB — COMPREHENSIVE METABOLIC PANEL WITH GFR
ALT: 13 U/L — ABNORMAL LOW (ref 14–54)
AST: 14 U/L — ABNORMAL LOW (ref 15–41)
Albumin: 3.4 g/dL — ABNORMAL LOW (ref 3.5–5.0)
Alkaline Phosphatase: 69 U/L (ref 38–126)
Anion gap: 8 (ref 5–15)
BUN: 18 mg/dL (ref 6–20)
CO2: 27 mmol/L (ref 22–32)
Calcium: 9.1 mg/dL (ref 8.9–10.3)
Chloride: 101 mmol/L (ref 101–111)
Creatinine, Ser: 0.68 mg/dL (ref 0.44–1.00)
GFR calc Af Amer: >60 mL/min (ref >60)
GFR calc non Af Amer: >60 mL/min (ref >60)
Glucose, Bld: 233 mg/dL — ABNORMAL HIGH (ref 65–99)
Potassium: 4.3 mmol/L (ref 3.5–5.1)
Sodium: 136 mmol/L (ref 135–145)
Total Bilirubin: 0.3 mg/dL (ref 0.3–1.2)
Total Protein: 6.7 g/dL (ref 6.5–8.1)

## 2016-08-29 LAB — PHOSPHORUS: Phosphorus: 2.7 mg/dL (ref 2.5–4.6)

## 2016-08-29 LAB — MAGNESIUM: Magnesium: 1.8 mg/dL (ref 1.7–2.4)

## 2016-08-29 NOTE — Progress Notes (Signed)
PROGRESS NOTE    Kara [name]  ZOX:[id number] DOB: [birth year] DOA: 08/26/2016 PCP: [name], MD   Brief Narrative:  Kara [name] is a 31 y.o. female with known history of bronchial asthma, ongoing tobacco abuse and other comorbids who presented to the ER with shortness of breath and wheezing over the last 3 days. Patient also states productive cough but denies any hemoptysis. Has been having subjective feeling of fever chills. Denies any chest pain. This is the third admission for the patient is last 2 months for shortness of breath and possible pneumonia. Patient denies any sick contacts or recent travel. Denies any mold in the house or any birds. In the ER patient was found to be wheezing and short of breath but able to complete sentences. Patient also was hypoxic. Chest x-ray shows persistent bilateral infiltrates which was worsening in the right side. Patient states she just recently completed antibiotics and prednisone until then patient was doing fine. Once the course of antibiotics and steroids for over patient started developing wheezing again. She was admitted for Acute Respiratory Failure with Hypoxia along with a Recurrent PNA and Asthma Exacerbation. She was placed on IV Steroids, Nebs, and Abx and Pulmonary was consulted to evaluate for further recommendations. Steroids increased as patient was wheezing yesterday and today patient is still continually wheezing. Awaiting results from Tests ordered by Pulmonary and for PFT's either tomorrow or the day after.   Assessment & Plan:   Principal Problem:   Acute respiratory failure with hypoxia (HCC) Active Problems:   Hypertension   GERD (gastroesophageal reflux disease)   Benign essential HTN   Asthma exacerbation   Alcohol abuse, in remission   HCAP (healthcare-associated pneumonia)   Tobacco abuse   Respiratory failure with hypoxia (HCC)   Hyponatremia   Hyperglycemia  Acute Hypoxic Respiratory Failure 2/2 Right Lung  HCAP and concomitant Asthma Exacerbation, improving - Was found to be hypoxic at 84% on Room Air - Continuous pulse oximetry with nasal cannulaoxygen to keep O2 saturation greater than 92%; Still wearing O2 via Caroline 2 Liters - C/w Solu-Medrol IV  60 mg q6h - Duonebs scheduled 4 Times a day and Albuterol Nebs q2hprn shortness of breath/wheezing - Continue Montelukast 10 mg po qHS - Since this is Patient's 3rd Hospitalization Pulmonology was consulted -Pulmonary Recc's As below  Health-Care Associated Pneumonia with likely concomitant Severe Asthma Exacerbation -Given IV Magnesium on admission -CXR 5/10 showed Persistent bilateral heterogeneous airspace opacities, progressed at the right lung base. In conjunction with prior chest CT, findings can be seen in the setting of pulmonary hemorrhage or interstitial lung disease. Atypical infection could have a similar appearance. -Obtained SLP to evaluate for Aspiration and SLP Clinician feels like it is very unlikely the patient's PNA's were caused by Aspiration from foods and liquids  -Patient Denies Hemoptysis  -Checked Urine Legionella and Strep Ag and were Negative -WBC went from 14.4 -> 10.0 -> 14.5 -> 9.7 -C/w Broad Spectrum Abx with IV Vancomycin and IV Cefepime -Nebs and Steroids as Above -C/w Budesonide 0.25 mg po BID -Appreciate Pulmonary evaluation for recurrent PNA's -Pulmonary recommending Lung Fxn testing [facility] this [facility] (to be done likely tomorrow) along with Pulmonary office visit after Prednisone Taper and arranging outpatient bronchoscopy for cell count, fungal Cx and potential Biopsies. -Serum IgE, Aspergillus specific IgE, Checking Sputum for Fungus, Bacteria, AFB as well as Hypersensitivy Pneumonitis Profile and RAST Profile all PENDING. -Sputum Gram Stain done and showed FEW WBC PRESENT,BOTH PMN  AND MONONUCLEAR, FEW SQUAMOUS EPITHELIAL CELLS PRESENT, FEW GRAM POSITIVE COCCI IN PAIRS IN CLUSTERS  RARE GRAM POSITIVE  RODS,RARE BUDDING YEAST SEEN and Cx Pending   Essential Hypertension and Hx of Prolonged QT - Continue Metoprolol Succinate 25 mg po Daily  Alcohol abuse, in remission -Continue Naltrexone 50 mg po Daily and Thiamine 100 mg po Daily  Hyponatremia -Patient's Na+ was 134 and improved to 136 -Was on IV NS at 75 mL/hr for 12 hours -Repeat CMP in AM  Tobacco abuse: - Patient reports smoking approximately 1/2 pack of cigarettes per day on average  - Counseled on theneed ofcessation of tobacco use - C/w Nicotine patch 14 mg TD q24h  Anxiety - Continue Sertraline 75 mg po Daily  Insomnia - Continue Quetiapine 100 mg po Daily qhS  GERD - Pharmacy substitution of Protonix 40 mg po Daily   Hyperglycemia -In the setting of IV Steroid Use -Last HbA1c was 5.6 on 08/01/16 -Will place on Sensitive Novolog SSI AC and monitor CBG's AC HS -CBG's ranging from 151-198; Was 233 on BMP  DVT prophylaxis: SCDs and Heparin 5,000 units sq q8h Code Status: FULL CODE Family Communication: No family present at bedside Disposition Plan: Pending further Workup but likely Home when stable for D/C.   Consultants:   Pulmonary   Procedures: None   Antimicrobials:  Anti-infectives    Start     Dose/Rate Route Frequency Ordered Stop   08/29/16 0200  vancomycin (VANCOCIN) 1,250 mg in sodium chloride 0.9 % 250 mL IVPB     1,250 mg 166.7 mL/hr over 90 Minutes Intravenous Every 12 hours 08/28/16 1424     08/27/16 0630  vancomycin (VANCOCIN) IVPB 1000 mg/200 mL premix  Status:  Discontinued     1,000 mg 200 mL/hr over 60 Minutes Intravenous Every 8 hours 08/27/16 0624 08/28/16 1424   08/27/16 0600  ceFEPIme (MAXIPIME) 1 g in dextrose 5 % 50 mL IVPB     1 g 100 mL/hr over 30 Minutes Intravenous Every 8 hours 08/26/16 2229 09/04/16 0559   08/26/16 2130  vancomycin (VANCOCIN) IVPB 1000 mg/200 mL premix     1,000 mg 200 mL/hr over 60 Minutes Intravenous  Once 08/26/16 2118 08/26/16 2317    08/26/16 2130  ceFEPIme (MAXIPIME) 2 g in dextrose 5 % 50 mL IVPB     2 g 100 mL/hr over 30 Minutes Intravenous  Once 08/26/16 2118 08/26/16 2216     Subjective: Seen and examined and was still wheezing significantly. States she didn't feel much different than yesterday. No nausea or vomiting.   Objective: Vitals:   08/29/16 0512 08/29/16 0907 08/29/16 0908 08/29/16 1428  BP: (!) 93/51   127/69  Pulse: 79   86  Resp: 18   18  Temp: 98.2 F (36.8 C)   98.7 F (37.1 C)  TempSrc: Oral   Oral  SpO2: 97% 98% 98% 97%  Weight:      Height:        Intake/Output Summary (Last 24 hours) at 08/29/16 1722 Last data filed at 08/29/16 1500  Gross per 24 hour  Intake             1280 ml  Output             1500 ml  Net             -220 ml   Filed Weights   08/26/16 2255  Weight: 110 kg (242 lb 8.1 oz)   Examination: Physical  Exam:  Constitutional: Obese Caucasian 30 yo female in NAD Eyes: Sclerae anicteric; Lids Normal ENMT: Grossly normal hearing. Ears and Nose appear normal. Mucous Membranes moist. Neck: Supple with no evidence of thyromegaly or JVD Respiratory:  Diminished with diffuse wheezing worse posteriorly than anteriorly. Had some Rhonchi. Patient not tachypenic but using accessory muscles to breathe.  Cardiovascular: RRR, no m/r/g; No lower extremity edema Abdomen: Soft and non-tender; Distended due to body habitus. Bowel sounds present GU: Deferred.  Musculoskeletal: No Cyanosis. Good ROM Skin: Warm and dry. Patient appeared flushed. Tattoos noted.  Neurologic: CN 2-12 grossly intact; No focal deficits. Psychiatric: Pleasant mood and affect. Alert and Oriented x3 with Normal Judgement and Insight.   Data Reviewed: I have personally reviewed following labs and imaging studies  CBC:  Recent Labs Lab 08/26/16 1933 08/27/16 0543 08/28/16 0621 08/29/16 0524  WBC 14.4* 10.0 14.5* 9.7  NEUTROABS 9.7*  --  11.9* 8.5*  HGB 12.9 12.0 11.2* 11.9*  HCT 40.5 38.2 36.0  38.0  MCV 83.5 83.0 83.9 84.4  PLT 263 215 232 232   Basic Metabolic Panel:  Recent Labs Lab 08/26/16 1933 08/27/16 0543 08/28/16 0621 08/29/16 0524  NA 138 134* 138 136  K 4.4 4.4 4.1 4.3  CL 102 103 107 101  CO2 27 21* 25 27  GLUCOSE 134* 215* 201* 233*  BUN 12 12 16 18   CREATININE 0.72 0.65 0.60 0.68  CALCIUM 8.6* 8.6* 8.7* 9.1  MG  --   --  1.7 1.8  PHOS  --   --  2.3* 2.7   GFR: Estimated Creatinine Clearance: 124.6 mL/min (by C-G formula based on SCr of 0.68 mg/dL). Liver Function Tests:  Recent Labs Lab 08/26/16 1933 08/28/16 0621 08/29/16 0524  AST 26 15 14*  ALT 11* 11* 13*  ALKPHOS 78 66 69  BILITOT 0.9 0.1* 0.3  PROT 7.7 6.3* 6.7  ALBUMIN 4.1 3.1* 3.4*   No results for input(s): LIPASE, AMYLASE in the last 168 hours. No results for input(s): AMMONIA in the last 168 hours. Coagulation Profile: No results for input(s): INR, PROTIME in the last 168 hours. Cardiac Enzymes: No results for input(s): CKTOTAL, CKMB, CKMBINDEX, TROPONINI in the last 168 hours. BNP (last 3 results) No results for input(s): PROBNP in the last 8760 hours. HbA1C: No results for input(s): HGBA1C in the last 72 hours. CBG:  Recent Labs Lab 08/28/16 1151 08/28/16 1642 08/28/16 2123 08/29/16 0721 08/29/16 1217  GLUCAP 150* 151* 154* 173* 198*   Lipid Profile: No results for input(s): CHOL, HDL, LDLCALC, TRIG, CHOLHDL, LDLDIRECT in the last 72 hours. Thyroid Function Tests: No results for input(s): TSH, T4TOTAL, FREET4, T3FREE, THYROIDAB in the last 72 hours. Anemia Panel: No results for input(s): VITAMINB12, FOLATE, FERRITIN, TIBC, IRON, RETICCTPCT in the last 72 hours. Sepsis Labs: No results for input(s): PROCALCITON, LATICACIDVEN in the last 168 hours.  Recent Results (from the past 240 hour(s))  Culture, sputum-assessment     Status: None   Collection Time: 08/27/16  6:03 PM  Result Value Ref Range Status   Specimen Description SPUTUM  Final   Special Requests  NONE  Final   Sputum evaluation THIS SPECIMEN IS ACCEPTABLE FOR SPUTUM CULTURE  Final   Report Status 08/27/2016 FINAL  Final  Culture, respiratory (NON-Expectorated)     Status: None (Preliminary result)   Collection Time: 08/27/16  6:03 PM  Result Value Ref Range Status   Specimen Description SPUTUM  Final   Special Requests NONE Reflexed  from [id number]  Final   Gram Stain   Final    FEW WBC PRESENT,BOTH PMN AND MONONUCLEAR FEW SQUAMOUS EPITHELIAL CELLS PRESENT FEW GRAM POSITIVE COCCI IN PAIRS IN CLUSTERS RARE GRAM POSITIVE RODS RARE BUDDING YEAST SEEN    Culture   Final    CULTURE REINCUBATED FOR BETTER GROWTH Performed at [facility], [address] [place], [place] [address]    Report Status PENDING  Incomplete     Radiology Studies: Dg Swallowing Func-speech Pathology  Result Date: 08/28/2016 Objective Swallowing Evaluation: Type of Study: MBS-Modified Barium Swallow Study Patient Details Name: Kara [name] MRN: [id number] Date of Birth: 1985/09/05 Today's Date: 08/28/2016 Time: SLP Start Time (ACUTE ONLY): 1455-SLP Stop Time (ACUTE ONLY): 1505 SLP Time Calculation (min) (ACUTE ONLY): 10 min Past Medical History: Past Medical History: Diagnosis Date . Alcohol abuse  . Asthma  . Environmental allergies  . GERD (gastroesophageal reflux disease)  . Headache  . Hypertension  . Pancreatitis  . Prolonged QT syndrome  . Syncope and collapse  . Vision abnormalities  Past Surgical History: Past Surgical History: Procedure Laterality Date . ESOPHAGOGASTRODUODENOSCOPY N/A 07/17/2014  Procedure: ESOPHAGOGASTRODUODENOSCOPY (EGD);  Surgeon: Dorena Cookey, MD;  Location: [facility];  Service: Endoscopy;  Laterality: N/A; . TYMPANOSTOMY TUBE PLACEMENT   HPI: Patient is a 31 y.o. female with PMH: bronchial asthma, ongoing tobacco abuse, alcohol abuse, GERD, pancreatitis, HTN, headache, syncope and collapse, environmental allergies. She has been admitted two other times in the past two months for SOB  and possible PNA. CXR revealed bilateral infiltrates, worsening on the right side. MD orderded MBS to r/o aspiration as the cause of patient's recurrent PNA's. Subjective: pleasant, cooperative, no complaints Assessment / Plan / Recommendation CHL IP CLINICAL IMPRESSIONS 08/28/2016 Clinical Impression Patient presents with an oropharyngeal swallow that is largely [facility]. She exhibited one instance of flash penetration with large volume sip of thin liquids from cup, but did not exhibit any other incidence of penetration or aspiration. Patient with timely swallow initiation, full clearance of all tested boluses (puree, hard solid, pill, thin liquids) with no residuals remaining in laryngeal vestibule post swallow. As per esophageal sweep, pill transited upper esophagus without difficulty. Based on this study, SLP's clinician judgement is that it would be very unlikely that patient's PNA's were caused by aspiration from foods and liquids she eats. Although no evidence of retrograde movement of liquids or solids was observed during this study, cannot r/o possibility of aspiration from reflux, but this seems unlikely. SLP Visit Diagnosis Dysphagia, pharyngeal phase (R13.13) Attention and concentration deficit following -- Frontal lobe and executive function deficit following -- Impact on safety and function No limitations   CHL IP TREATMENT RECOMMENDATION 08/28/2016 Treatment Recommendations No treatment recommended at this time   No flowsheet data found. CHL IP DIET RECOMMENDATION 08/28/2016 SLP Diet Recommendations Regular solids;Thin liquid Liquid Administration via Cup;Straw Medication Administration Whole meds with liquid Compensations -- Postural Changes Seated upright at 90 degrees   No flowsheet data found.  CHL IP FOLLOW UP RECOMMENDATIONS 08/28/2016 Follow up Recommendations None   No flowsheet data found.     CHL IP ORAL PHASE 08/28/2016 Oral Phase WFL Oral - Pudding Teaspoon -- Oral - Pudding Cup -- Oral - Honey  Teaspoon -- Oral - Honey Cup -- Oral - Nectar Teaspoon -- Oral - Nectar Cup -- Oral - Nectar Straw -- Oral - Thin Teaspoon -- Oral - Thin Cup -- Oral - Thin Straw -- Oral - Puree -- Oral -  Mech Soft -- Oral - Regular -- Oral - Multi-Consistency -- Oral - Pill -- Oral Phase - Comment --  CHL IP PHARYNGEAL PHASE 08/28/2016 Pharyngeal Phase Impaired Pharyngeal- Pudding Teaspoon -- Pharyngeal -- Pharyngeal- Pudding Cup -- Pharyngeal -- Pharyngeal- Honey Teaspoon -- Pharyngeal -- Pharyngeal- Honey Cup -- Pharyngeal -- Pharyngeal- Nectar Teaspoon -- Pharyngeal -- Pharyngeal- Nectar Cup -- Pharyngeal -- Pharyngeal- Nectar Straw -- Pharyngeal -- Pharyngeal- Thin Teaspoon -- Pharyngeal -- Pharyngeal- Thin Cup Reduced airway/laryngeal closure Pharyngeal Material enters airway, remains ABOVE vocal cords then ejected out Pharyngeal- Thin Straw -- Pharyngeal -- Pharyngeal- Puree WFL Pharyngeal -- Pharyngeal- Mechanical Soft NT Pharyngeal -- Pharyngeal- Regular WFL Pharyngeal -- Pharyngeal- Multi-consistency NT Pharyngeal -- Pharyngeal- Pill WFL Pharyngeal -- Pharyngeal Comment Only one instance of flash penetration with large sip of thin liquids  CHL IP CERVICAL ESOPHAGEAL PHASE 08/28/2016 Cervical Esophageal Phase WFL Pudding Teaspoon -- Pudding Cup -- Honey Teaspoon -- Honey Cup -- Nectar Teaspoon -- Nectar Cup -- Nectar Straw -- Thin Teaspoon -- Thin Cup -- Thin Straw -- Puree -- Mechanical Soft -- Regular -- Multi-consistency -- Pill -- Cervical Esophageal Comment -- Angela NevinJohn T. Preston, MA, CCC-SLP 08/28/16 3:33 PM              Scheduled Meds: . budesonide (PULMICORT) nebulizer solution  0.25 mg Nebulization BID  . gabapentin  900 mg Oral BID  . guaiFENesin  1,200 mg Oral BID  . heparin subcutaneous  5,000 Units Subcutaneous Q8H  . insulin aspart  0-9 Units Subcutaneous TID WC  . ipratropium-albuterol  3 mL Nebulization QID  . methylPREDNISolone (SOLU-MEDROL) injection  60 mg Intravenous Q6H  . metoprolol succinate  25  mg Oral Daily  . montelukast  10 mg Oral QHS  . naltrexone  50 mg Oral Daily  . nicotine  14 mg Transdermal Daily  . pantoprazole  40 mg Oral Daily  . QUEtiapine  100 mg Oral QHS  . sertraline  75 mg Oral Daily  . thiamine  100 mg Oral Daily   Continuous Infusions: . ceFEPime (MAXIPIME) IV Stopped (08/29/16 1232)  . vancomycin Stopped (08/29/16 1712)    LOS: 3 days   Merlene Laughtermair Latif Genesi Stefanko, DO [facility] Pager [phone]  If 7PM-7AM, please contact night-coverage [email or web address] Password [email or web address] 08/29/2016, 5:22 PM

## 2016-08-30 ENCOUNTER — Inpatient Hospital Stay (HOSPITAL_COMMUNITY): Payer: BC Managed Care – PPO

## 2016-08-30 DIAGNOSIS — J4551 Severe persistent asthma with (acute) exacerbation: Secondary | ICD-10-CM

## 2016-08-30 LAB — COMPREHENSIVE METABOLIC PANEL
ALT: 11 U/L — ABNORMAL LOW (ref 14–54)
AST: 15 U/L (ref 15–41)
Albumin: 3.3 g/dL — ABNORMAL LOW (ref 3.5–5.0)
Alkaline Phosphatase: 63 U/L (ref 38–126)
Anion gap: 11 (ref 5–15)
BUN: 20 mg/dL (ref 6–20)
CHLORIDE: 101 mmol/L (ref 101–111)
CO2: 25 mmol/L (ref 22–32)
Calcium: 9 mg/dL (ref 8.9–10.3)
Creatinine, Ser: 0.74 mg/dL (ref 0.44–1.00)
Glucose, Bld: 245 mg/dL — ABNORMAL HIGH (ref 65–99)
POTASSIUM: 4 mmol/L (ref 3.5–5.1)
SODIUM: 137 mmol/L (ref 135–145)
Total Bilirubin: 0.3 mg/dL (ref 0.3–1.2)
Total Protein: 6.4 g/dL — ABNORMAL LOW (ref 6.5–8.1)

## 2016-08-30 LAB — GLUCOSE, CAPILLARY
GLUCOSE-CAPILLARY: 151 mg/dL — AB (ref 65–99)
GLUCOSE-CAPILLARY: 131 mg/dL — AB (ref 65–99)
Glucose-Capillary: 214 mg/dL — ABNORMAL HIGH (ref 65–99)
Glucose-Capillary: 217 mg/dL — ABNORMAL HIGH (ref 65–99)

## 2016-08-30 LAB — CBC WITH DIFFERENTIAL/PLATELET
BASOS ABS: 0 10*3/uL (ref 0.0–0.1)
Basophils Relative: 0 %
EOS ABS: 0 10*3/uL (ref 0.0–0.7)
EOS PCT: 0 %
HCT: 38.3 % (ref 36.0–46.0)
Hemoglobin: 12.1 g/dL (ref 12.0–15.0)
LYMPHS ABS: 1 10*3/uL (ref 0.7–4.0)
LYMPHS PCT: 9 %
MCH: 26.5 pg (ref 26.0–34.0)
MCHC: 31.6 g/dL (ref 30.0–36.0)
MCV: 83.8 fL (ref 78.0–100.0)
MONO ABS: 0.6 10*3/uL (ref 0.1–1.0)
Monocytes Relative: 5 %
Neutro Abs: 9.7 10*3/uL — ABNORMAL HIGH (ref 1.7–7.7)
Neutrophils Relative %: 86 %
PLATELETS: 255 10*3/uL (ref 150–400)
RBC: 4.57 MIL/uL (ref 3.87–5.11)
RDW: 14.8 % (ref 11.5–15.5)
WBC: 11.3 10*3/uL — ABNORMAL HIGH (ref 4.0–10.5)

## 2016-08-30 LAB — PULMONARY FUNCTION TEST
FEF 25-75 Pre: 0.56 L/sec
FEF2575-%Pred-Pre: 16 %
FEV1-%Pred-Pre: 38 %
FEV1-PRE: 1.26 L
FEV1FVC-%Pred-Pre: 75 %
FEV6-%PRED-PRE: 50 %
FEV6-Pre: 1.93 L
FEV6FVC-%PRED-PRE: 99 %
FVC-%Pred-Pre: 50 %
FVC-PRE: 1.97 L
PRE FEV6/FVC RATIO: 98 %
Pre FEV1/FVC ratio: 64 %

## 2016-08-30 LAB — CULTURE, RESPIRATORY W GRAM STAIN: Culture: NORMAL

## 2016-08-30 LAB — ASPERGILLUS ANTIBODY BY IMMUNODIFF
ASPERGILLUS FLAVUS: NEGATIVE
Aspergillus fumigatus, IgG: NEGATIVE
Aspergillus niger: NEGATIVE

## 2016-08-30 LAB — MAGNESIUM: MAGNESIUM: 1.8 mg/dL (ref 1.7–2.4)

## 2016-08-30 LAB — PHOSPHORUS: PHOSPHORUS: 2.6 mg/dL (ref 2.5–4.6)

## 2016-08-30 MED ORDER — METHYLPREDNISOLONE SODIUM SUCC 125 MG IJ SOLR
60.0000 mg | Freq: Three times a day (TID) | INTRAMUSCULAR | Status: DC
  Administered 2016-08-30 – 2016-08-31 (×2): 60 mg via INTRAVENOUS
  Filled 2016-08-30 (×2): qty 2

## 2016-08-30 NOTE — Progress Notes (Signed)
31 year old smoker with recurrent attacks of Cough wheezing and shortness of breath with bilateral groundglass infiltrates. She had  admissions in February and then again in 07/2016 For pneumonia.   She feels much improved from admission, was wheezing earlier today Cough productive of green sputum  On exam-Obeseafebrile, able to lie supine in bed, decreased breath sounds bilateral, no rhonchi, S1-S2 normal, no edema  Labs Absolute eosinophil count 1400 on admission Chest x-ray showed bilateral airspace disease predominantly lower lobes  Impression- Being treated as HCAP, severe asthma exacerbation Differential diagnosis includes Eosinophilic pneumonia, hypersensitivity pneumonitis  Recommend- Decrease Solu-Medrol 60 every 8, now that bronchospasm is resolving Would slowly taper down to 40 mg prednisone-and then do an extended taper over one month. She would need an office visit when she is down to 20 mg of prednisone  Repeat chest x-ray tomorrow and can simplify antibiotics Would defer PFTs until office visit  Oretha MilchALVA,Luba Matzen V. MD

## 2016-08-30 NOTE — Progress Notes (Signed)
PROGRESS NOTE    Kara [name]  RUE:[id number] DOB: 1985-05-06 DOA: 08/26/2016 PCP: [name], MD   Brief Narrative:  Kara [name] is a 31 y.o. female with known history of bronchial asthma, ongoing tobacco abuse and other comorbids who presented to the ER with shortness of breath and wheezing over the last 3 days. Patient also states productive cough but denies any hemoptysis. Has been having subjective feeling of fever chills. Denies any chest pain. This is the third admission for the patient is last 2 months for shortness of breath and possible pneumonia. Patient denies any sick contacts or recent travel. Denies any mold in the house or any birds. In the ER patient was found to be wheezing and short of breath but able to complete sentences. Patient also was hypoxic. Chest x-ray shows persistent bilateral infiltrates which was worsening in the right side. Patient states she just recently completed antibiotics and prednisone until then patient was doing fine. Once the course of antibiotics and steroids for over patient started developing wheezing again. She was admitted for Acute Respiratory Failure with Hypoxia along with a Recurrent PNA and Asthma Exacerbation. She was placed on IV Steroids, Nebs, and Abx and Pulmonary was consulted to evaluate for further recommendations. Steroids increased as patient was wheezing yesterday and today patient is still continually wheezing. Awaiting results from Tests ordered by Pulmonary and for PFT's were ordered today. Per pulmonary would defer PFT's until office visit but were already done. Patient feels slightly better.   Assessment & Plan:   Principal Problem:   Acute respiratory failure with hypoxia (HCC) Active Problems:   Hypertension   GERD (gastroesophageal reflux disease)   Benign essential HTN   Asthma exacerbation   Alcohol abuse, in remission   HCAP (healthcare-associated pneumonia)   Tobacco abuse   Respiratory failure with hypoxia  (HCC)   Hyponatremia   Hyperglycemia  Acute Hypoxic Respiratory Failure 2/2 Right Lung HCAP and concomitant Asthma Exacerbation, improving - Was found to be hypoxic at 84% on Room Air - Continuous pulse oximetry with nasal cannulaoxygen to keep O2 saturation greater than 92%; Still wearing O2 via [place] 2 Liters - Solu-Medrol IV 60 mg q6h changed to q8h by Pulmonary - Per Pulmonary would slowly taper down to 40 mg po Prednisone and then do an extended taper over one month and states she will need an office visit when she is down to 20 mg po Prednisone - Duonebs scheduled 4 Times a day and Albuterol Nebs q2hprn shortness of breath/wheezing - Continue Montelukast 10 mg po qHS - Since this is Patient's 3rd Hospitalization Pulmonology was consulted -Pulmonary Recc's Appreciated.  Health-Care Associated Pneumonia with likely concomitant Severe Asthma Exacerbation -Given IV Magnesium on admission -CXR 5/10 showed Persistent bilateral heterogeneous airspace opacities, progressed at the right lung base. In conjunction with prior chest CT, findings can be seen in the setting of pulmonary hemorrhage or interstitial lung disease. Atypical infection could have a similar appearance. -Obtained SLP to evaluate for Aspiration and SLP Clinician feels like it is very unlikely the patient's PNA's were caused by Aspiration from foods and liquids  -Patient Denies Hemoptysis  -Checked Urine Legionella and Strep Ag and were Negative -WBC went from 14.4 -> 10.0 -> 14.5 -> 9.7 -> 11.3 -De-escalated Broad Spectrum Abx with IV Vancomycin given Cx results; C/w IV Cefepime -Nebs and Steroids as Above -C/w Budesonide 0.25 mg po BID -Appreciate Pulmonary evaluation for recurrent PNA's -Pulmonary recommended Lung Fxn testing in the Hospital and done  today; Will need Repeat PFT's as an outpatient as well.  -Pulmonary office visit after Prednisone Taper and arranging outpatient bronchoscopy for cell count, fungal Cx and  potential Biopsies. -Serum IgE, Aspergillus specific IgE, Checking Sputum for Fungus, Bacteria, AFB as well as Hypersensitivy Pneumonitis Profile and RAST Profile all PENDING. -Sputum Gram Stain done and showed FEW WBC PRESENT,BOTH PMN AND MONONUCLEAR, FEW SQUAMOUS EPITHELIAL CELLS PRESENT, FEW GRAM POSITIVE COCCI IN PAIRS IN CLUSTERS  RARE GRAM POSITIVE RODS,RARE BUDDING YEAST SEEN and Cx Consistent with Normal Respiratory Flora -Pulmonary Repeating CXR in AM  Essential Hypertension and Hx of Prolonged QT - Continue Metoprolol Succinate 25 mg po Daily  Alcohol abuse, in remission -Continue Naltrexone 50 mg po Daily and Thiamine 100 mg po Daily  Hyponatremia, improved -Patient's Na+ was 134 and improved to 137 -Was on IV NS at 75 mL/hr for 12 hours and IVF now stopped. -Repeat CMP in AM  Tobacco abuse: - Patient reports smoking approximately 1/2 pack of cigarettes per day on average  - Counseled on theneed ofcessation of tobacco use - C/w Nicotine patch 14 mg TD q24h  Anxiety - Continue Sertraline 75 mg po Daily  Insomnia - Continue Quetiapine 100 mg po Daily qhS  GERD - Pharmacy substitution of Protonix 40 mg po Daily   Hyperglycemia -In the setting of IV Steroid Use -Last HbA1c was 5.6 on 08/01/16 -Will place on Sensitive Novolog SSI AC and monitor CBG's AC HS -CBG's ranging from 151-213; Was 245 on BMP  DVT prophylaxis: SCDs and Heparin 5,000 units sq q8h Code Status: FULL CODE Family Communication: No family present at bedside Disposition Plan: Pending further Workup but likely Home when stable for D/C.   Consultants:   Pulmonary Dr. Vassie Loll   Procedures: None   Antimicrobials:  Anti-infectives    Start     Dose/Rate Route Frequency Ordered Stop   08/29/16 0200  vancomycin (VANCOCIN) 1,250 mg in sodium chloride 0.9 % 250 mL IVPB  Status:  Discontinued     1,250 mg 166.7 mL/hr over 90 Minutes Intravenous Every 12 hours 08/28/16 1424 08/30/16 1637    08/27/16 0630  vancomycin (VANCOCIN) IVPB 1000 mg/200 mL premix  Status:  Discontinued     1,000 mg 200 mL/hr over 60 Minutes Intravenous Every 8 hours 08/27/16 0624 08/28/16 1424   08/27/16 0600  ceFEPIme (MAXIPIME) 1 g in dextrose 5 % 50 mL IVPB     1 g 100 mL/hr over 30 Minutes Intravenous Every 8 hours 08/26/16 2229 09/04/16 0559   08/26/16 2130  vancomycin (VANCOCIN) IVPB 1000 mg/200 mL premix     1,000 mg 200 mL/hr over 60 Minutes Intravenous  Once 08/26/16 2118 08/26/16 2317   08/26/16 2130  ceFEPIme (MAXIPIME) 2 g in dextrose 5 % 50 mL IVPB     2 g 100 mL/hr over 30 Minutes Intravenous  Once 08/26/16 2118 08/26/16 2216     Subjective: Seen and examined and was still wheezing posteriorly. States she feels ok. No nausea or vomiting. No CP. Wanted to do bronch today.   Objective: Vitals:   08/30/16 0531 08/30/16 0750 08/30/16 1229 08/30/16 1527  BP: 114/72   131/84  Pulse: 82   80  Resp: 18   18  Temp: 98.2 F (36.8 C)   98.7 F (37.1 C)  TempSrc: Oral   Oral  SpO2: 96% 96% 96% 98%  Weight:      Height:        Intake/Output Summary (Last 24 hours)  at 08/30/16 1645 Last data filed at 08/30/16 0452  Gross per 24 hour  Intake              100 ml  Output                0 ml  Net              100 ml   Filed Weights   08/26/16 2255  Weight: 110 kg (242 lb 8.1 oz)   Examination: Physical Exam:  Constitutional: 31 yo Caucasian obese female in NAD who is pleasant to speak with.  Eyes: Sclerae anicteric, Normal lids. ENMT: Grossly normal hearing. Mucous Membranes Moist. External Ears and Nose appear normal. Neck: Supple with no evidence of JVD Respiratory:  Diminished with wheezing in the posterior lung fields. Mild rhonchi. Patient not tachypenic but wearing supplemental O2 via [place]. Cardiovascular: RRR; S1 S2; no m/r/g; No lower extremity appreciated Abdomen: Soft, NT, Distended due to body habitus. Bowel sounds present GU: Deferred Musculoskeletal: No contractures  and no cyanosis Skin: Warm and dry. No rashes or lesions on limited skin evaluation but has some tattoos.  Neurologic: CN 2-12 grossly intact with no focal deficits. Psychiatric: Awake and Alert. Oriented x3 with appropriate mood and affect.    Data Reviewed: I have personally reviewed following labs and imaging studies  CBC:  Recent Labs Lab 08/26/16 1933 08/27/16 0543 08/28/16 0621 08/29/16 0524 08/30/16 0517  WBC 14.4* 10.0 14.5* 9.7 11.3*  NEUTROABS 9.7*  --  11.9* 8.5* 9.7*  HGB 12.9 12.0 11.2* 11.9* 12.1  HCT 40.5 38.2 36.0 38.0 38.3  MCV 83.5 83.0 83.9 84.4 83.8  PLT 263 215 232 232 255   Basic Metabolic Panel:  Recent Labs Lab 08/26/16 1933 08/27/16 0543 08/28/16 0621 08/29/16 0524 08/30/16 0517  NA 138 134* 138 136 137  K 4.4 4.4 4.1 4.3 4.0  CL 102 103 107 101 101  CO2 27 21* 25 27 25   GLUCOSE 134* 215* 201* 233* 245*  BUN 12 12 16 18 20   CREATININE 0.72 0.65 0.60 0.68 0.74  CALCIUM 8.6* 8.6* 8.7* 9.1 9.0  MG  --   --  1.7 1.8 1.8  PHOS  --   --  2.3* 2.7 2.6   GFR: Estimated Creatinine Clearance: 124.6 mL/min (by C-G formula based on SCr of 0.74 mg/dL). Liver Function Tests:  Recent Labs Lab 08/26/16 1933 08/28/16 0621 08/29/16 0524 08/30/16 0517  AST 26 15 14* 15  ALT 11* 11* 13* 11*  ALKPHOS 78 66 69 63  BILITOT 0.9 0.1* 0.3 0.3  PROT 7.7 6.3* 6.7 6.4*  ALBUMIN 4.1 3.1* 3.4* 3.3*   No results for input(s): LIPASE, AMYLASE in the last 168 hours. No results for input(s): AMMONIA in the last 168 hours. Coagulation Profile: No results for input(s): INR, PROTIME in the last 168 hours. Cardiac Enzymes: No results for input(s): CKTOTAL, CKMB, CKMBINDEX, TROPONINI in the last 168 hours. BNP (last 3 results) No results for input(s): PROBNP in the last 8760 hours. HbA1C: No results for input(s): HGBA1C in the last 72 hours. CBG:  Recent Labs Lab 08/29/16 1217 08/29/16 1719 08/29/16 2124 08/30/16 0806 08/30/16 1128  GLUCAP 198* 213*  212* 151* 131*   Lipid Profile: No results for input(s): CHOL, HDL, LDLCALC, TRIG, CHOLHDL, LDLDIRECT in the last 72 hours. Thyroid Function Tests: No results for input(s): TSH, T4TOTAL, FREET4, T3FREE, THYROIDAB in the last 72 hours. Anemia Panel: No results for input(s): VITAMINB12, FOLATE, FERRITIN,  TIBC, IRON, RETICCTPCT in the last 72 hours. Sepsis Labs: No results for input(s): PROCALCITON, LATICACIDVEN in the last 168 hours.  Recent Results (from the past 240 hour(s))  Culture, sputum-assessment     Status: None   Collection Time: 08/27/16  6:03 PM  Result Value Ref Range Status   Specimen Description SPUTUM  Final   Special Requests NONE  Final   Sputum evaluation THIS SPECIMEN IS ACCEPTABLE FOR SPUTUM CULTURE  Final   Report Status 08/27/2016 FINAL  Final  Culture, respiratory (NON-Expectorated)     Status: None   Collection Time: 08/27/16  6:03 PM  Result Value Ref Range Status   Specimen Description SPUTUM  Final   Special Requests NONE Reflexed from [id number]  Final   Gram Stain   Final    FEW WBC PRESENT,BOTH PMN AND MONONUCLEAR FEW SQUAMOUS EPITHELIAL CELLS PRESENT FEW GRAM POSITIVE COCCI IN PAIRS IN CLUSTERS RARE GRAM POSITIVE RODS RARE BUDDING YEAST SEEN    Culture   Final    Consistent with normal respiratory flora. Performed at [facility], [address] [place], [place] [id number]    Report Status 08/30/2016 FINAL  Final    Radiology Studies: No results found. Scheduled Meds: . budesonide (PULMICORT) nebulizer solution  0.25 mg Nebulization BID  . gabapentin  900 mg Oral BID  . guaiFENesin  1,200 mg Oral BID  . heparin subcutaneous  5,000 Units Subcutaneous Q8H  . insulin aspart  0-9 Units Subcutaneous TID WC  . ipratropium-albuterol  3 mL Nebulization QID  . methylPREDNISolone (SOLU-MEDROL) injection  60 mg Intravenous Q8H  . metoprolol succinate  25 mg Oral Daily  . montelukast  10 mg Oral QHS  . naltrexone  50 mg Oral Daily  . nicotine  14  mg Transdermal Daily  . pantoprazole  40 mg Oral Daily  . QUEtiapine  100 mg Oral QHS  . sertraline  75 mg Oral Daily  . thiamine  100 mg Oral Daily   Continuous Infusions: . ceFEPime (MAXIPIME) IV 1 g (08/30/16 1412)    LOS: 4 days   Merlene Laughtermair Latif Shelton Soler, DO [facility] Pager [phone]  If 7PM-7AM, please contact night-coverage [email or web address] Password [email or web address] 08/30/2016, 4:45 PM

## 2016-08-31 ENCOUNTER — Inpatient Hospital Stay (HOSPITAL_COMMUNITY): Payer: BC Managed Care – PPO

## 2016-08-31 ENCOUNTER — Ambulatory Visit: Payer: BC Managed Care – PPO | Admitting: Allergy and Immunology

## 2016-08-31 LAB — CBC WITH DIFFERENTIAL/PLATELET
BASOS ABS: 0 10*3/uL (ref 0.0–0.1)
Basophils Relative: 0 %
EOS ABS: 0 10*3/uL (ref 0.0–0.7)
EOS PCT: 0 %
HCT: 40.7 % (ref 36.0–46.0)
Hemoglobin: 12.9 g/dL (ref 12.0–15.0)
LYMPHS PCT: 10 %
Lymphs Abs: 1 10*3/uL (ref 0.7–4.0)
MCH: 26.4 pg (ref 26.0–34.0)
MCHC: 31.7 g/dL (ref 30.0–36.0)
MCV: 83.4 fL (ref 78.0–100.0)
MONO ABS: 0.3 10*3/uL (ref 0.1–1.0)
Monocytes Relative: 3 %
Neutro Abs: 8.8 10*3/uL — ABNORMAL HIGH (ref 1.7–7.7)
Neutrophils Relative %: 87 %
Platelets: 231 10*3/uL (ref 150–400)
RBC: 4.88 MIL/uL (ref 3.87–5.11)
RDW: 14.8 % (ref 11.5–15.5)
WBC: 10.1 10*3/uL (ref 4.0–10.5)

## 2016-08-31 LAB — COMPREHENSIVE METABOLIC PANEL
ALT: 13 U/L — ABNORMAL LOW (ref 14–54)
ANION GAP: 10 (ref 5–15)
AST: 18 U/L (ref 15–41)
Albumin: 3.5 g/dL (ref 3.5–5.0)
Alkaline Phosphatase: 66 U/L (ref 38–126)
BILIRUBIN TOTAL: 0.3 mg/dL (ref 0.3–1.2)
BUN: 26 mg/dL — ABNORMAL HIGH (ref 6–20)
CALCIUM: 8.7 mg/dL — AB (ref 8.9–10.3)
CO2: 25 mmol/L (ref 22–32)
Chloride: 103 mmol/L (ref 101–111)
Creatinine, Ser: 0.7 mg/dL (ref 0.44–1.00)
GLUCOSE: 200 mg/dL — AB (ref 65–99)
Potassium: 4.1 mmol/L (ref 3.5–5.1)
Sodium: 138 mmol/L (ref 135–145)
TOTAL PROTEIN: 6.3 g/dL — AB (ref 6.5–8.1)

## 2016-08-31 LAB — MAGNESIUM: MAGNESIUM: 1.9 mg/dL (ref 1.7–2.4)

## 2016-08-31 LAB — SEDIMENTATION RATE: Sed Rate: 5 mm/hr (ref 0–22)

## 2016-08-31 LAB — ACID FAST SMEAR (AFB, MYCOBACTERIA): Acid Fast Smear: NEGATIVE

## 2016-08-31 LAB — GLUCOSE, CAPILLARY
GLUCOSE-CAPILLARY: 143 mg/dL — AB (ref 65–99)
Glucose-Capillary: 203 mg/dL — ABNORMAL HIGH (ref 65–99)
Glucose-Capillary: 253 mg/dL — ABNORMAL HIGH (ref 65–99)
Glucose-Capillary: 247 mg/dL — ABNORMAL HIGH (ref 65–99)

## 2016-08-31 LAB — PHOSPHORUS: Phosphorus: 3.5 mg/dL (ref 2.5–4.6)

## 2016-08-31 LAB — ACID FAST SMEAR (AFB)

## 2016-08-31 MED ORDER — METHYLPREDNISOLONE SODIUM SUCC 40 MG IJ SOLR
40.0000 mg | Freq: Two times a day (BID) | INTRAMUSCULAR | Status: DC
  Administered 2016-08-31 – 2016-09-01 (×2): 40 mg via INTRAVENOUS
  Filled 2016-08-31 (×2): qty 1

## 2016-08-31 NOTE — Progress Notes (Addendum)
31 year old smoker with recurrent attacks of Cough wheezing and shortness of breath with bilateral groundglass infiltrates. She had  admissions in February and then again in 07/2016 For pneumonia.  SUBJECTIVE: No wheezing, able to ambulate Cough remains productive of green sputum  On exam-Obese, afebrile, able to lie supine in bed, decreased breath sounds bilateral, no rhonchi, S1-S2 normal, no edema  Labs CBGs 200 range Absolute eosinophil count 1400 on admission Chest x-ray 5/15 partial clearing of BLL bilateral airspace disease  ANA neg in 2016  Impression- Being treated as HCAP, severe asthma exacerbation Differential diagnosis includes Eosinophilic pneumonia, hypersensitivity pneumonitis  Recommend- Decrease Solu-Medrol 40 every 12, now that bronchospasm is resolving Would drop to 40 mg prednisone on 5/16 -and then do an extended taper by 10 mg q week She would need an office visit when she is down to 20 mg of prednisone  Cefepime x 7ds total - if green sputum persists , can change to levaquin on discharge x 3 more days Repeat ANa for completion, hypersens panel pending  Anticipate dc 5/16  Oretha MilchALVA,Shannyn Jankowiak V. MD

## 2016-08-31 NOTE — Progress Notes (Signed)
Patient ambulated approximately 300 feet in the hallway without oxygen. The oxygen saturation was between 90-93 % room air,  The patient tolerated the ambulation well and coughed when she went back into her room.

## 2016-08-31 NOTE — Progress Notes (Signed)
PROGRESS NOTE    Kara [name]  ZOX:[id number] DOB: Sep 16, 1985 DOA: 08/26/2016 PCP: [name], MD   Brief Narrative:  Kara [name] is a 31 y.o. female with known history of bronchial asthma, ongoing tobacco abuse and other comorbids who presented to the ER with shortness of breath and wheezing over the last 3 days. Patient also states productive cough but denies any hemoptysis. Has been having subjective feeling of fever chills. Denies any chest pain. This is the third admission for the patient is last 2 months for shortness of breath and possible pneumonia. Patient denies any sick contacts or recent travel. Denies any mold in the house or any birds. In the ER patient was found to be wheezing and short of breath but able to complete sentences. Patient also was hypoxic. Chest x-ray shows persistent bilateral infiltrates which was worsening in the right side. Patient states she just recently completed antibiotics and prednisone until then patient was doing fine. Once the course of antibiotics and steroids for over patient started developing wheezing again.   She was admitted for Acute Respiratory Failure with Hypoxia along with a Recurrent PNA and Asthma Exacerbation. She was placed on IV Steroids, Nebs, and Abx and Pulmonary was consulted to evaluate for further recommendations. Steroids are being weaned down now as patient's wheezing is improving. Pulmonary recommends 7 day course of IV Cefepime and if green sputum persists changing to Levaquin on D/C for 3 more days.  Assessment & Plan:   Principal Problem:   Acute respiratory failure with hypoxia (HCC) Active Problems:   Hypertension   GERD (gastroesophageal reflux disease)   Benign essential HTN   Asthma exacerbation   Alcohol abuse, in remission   HCAP (healthcare-associated pneumonia)   Tobacco abuse   Respiratory failure with hypoxia (HCC)   Hyponatremia   Hyperglycemia  Acute Hypoxic Respiratory Failure 2/2 Right Lung HCAP  and concomitant Asthma Exacerbation, improved - Was found to be hypoxic at 84% on Room Air on Admission - Continuous pulse oximetry with nasal cannulaoxygen to keep O2 saturation greater than 92%; Not wearing O2 via [place] 2 Liters now - Solu-Medrol IV 60 mg q8h to IV 40 mg q12h by Pulmonary - Per Pulmonary would taper down to 40 mg po Prednisone on 5/16 and then do an extended taper over one month with 10 mg q week and states she will need an office visit when she is down to 20 mg po Prednisone - Duonebs scheduled 4 Times a day and Albuterol Nebs q2hprn shortness of breath/wheezing - Continue Montelukast 10 mg po qHS - Since this is Patient's 3rd Hospitalization Pulmonology was consulted -Pulmonary Recc's Appreciated. -Will need Walk Screen Prior to D/C  Health-Care Associated Pneumonia with likely concomitant Severe Asthma Exacerbation -Given IV Magnesium on admission -CXR 5/10 showed Persistent bilateral heterogeneous airspace opacities, progressed at the right lung base. In conjunction with prior chest CT, findings can be seen in the setting of pulmonary hemorrhage or interstitial lung disease. Atypical infection could have a similar appearance. -Obtained SLP to evaluate for Aspiration and SLP Clinician feels like it is very unlikely the patient's PNA's were caused by Aspiration from foods and liquids  -Patient Denies Hemoptysis  -Checked Urine Legionella and Strep Ag and were Negative -WBC went from 14.4 -> 10.0 -> 14.5 -> 9.7 -> 11.3 -> 10.1 -De-escalated Broad Spectrum Abx with IV Vancomycin given Cx results; C/w IV Cefepime for 7 days (Day 6/7) -Nebs and Steroids as Above -C/w Budesonide 0.25 mg po BID -  Appreciate Pulmonary evaluation for recurrent PNA's -Pulmonary recommended Lung Fxn testing in the Hospital and done today; Will need Repeat PFT's as an outpatient as well.  -Pulmonary office visit after Prednisone Taper and arranging outpatient bronchoscopy for cell count, fungal Cx and  potential Biopsies. -Serum IgE, Aspergillus specific IgE, Checking Sputum for Fungus, Bacteria, AFB as well as Hypersensitivity Pneumonitis Profile and RAST Profile all PENDING. -Sputum Gram Stain done and showed FEW WBC PRESENT,BOTH PMN AND MONONUCLEAR, FEW SQUAMOUS EPITHELIAL CELLS PRESENT, FEW GRAM POSITIVE COCCI IN PAIRS IN CLUSTERS  RARE GRAM POSITIVE RODS,RARE BUDDING YEAST SEEN and Cx Consistent with Normal Respiratory Flora -CXR this AM showed Partial clearing of patchy infiltrate from the bases bilaterally. A small amount of residual opacity remains in the bases. Lungs elsewhere clear. Stable cardiac silhouette -Pulmonary Recommends repeating ANA for Completion and was ordered this AM  Essential Hypertension and Hx of Prolonged QT - Continue Metoprolol Succinate 25 mg po Daily  Alcohol abuse, in remission -Continue Naltrexone 50 mg po Daily and Thiamine 100 mg po Daily  Hyponatremia, improved -Patient's Na+ was 134 and improved to 138 -Was on IV NS at 75 mL/hr for 12 hours and IVF now stopped. -Repeat CMP in AM  Tobacco abuse: - Patient reports smoking approximately 1/2 pack of cigarettes per day on average  - Counseled on theneed ofcessation of tobacco use - C/w Nicotine patch 14 mg TD q24h  Anxiety - Continue Sertraline 75 mg po Daily  Insomnia - Continue Quetiapine 100 mg po Daily qhS  GERD - Pharmacy substitution of Protonix 40 mg po Daily   Hyperglycemia -In the setting of IV Steroid Use -Last HbA1c was 5.6 on 08/01/16 -Will place on Sensitive Novolog SSI AC and monitor CBG's AC HS -CBG's ranging from 203-253; Was 200 on BMP  DVT prophylaxis: SCDs and Heparin 5,000 units sq q8h Code Status: FULL CODE Family Communication: Mother updated at bedside Disposition Plan: Likely Home in AM  Consultants:   Pulmonary Dr. Vassie Loll   Procedures: None   Antimicrobials:  Anti-infectives    Start     Dose/Rate Route Frequency Ordered Stop   08/29/16 0200   vancomycin (VANCOCIN) 1,250 mg in sodium chloride 0.9 % 250 mL IVPB  Status:  Discontinued     1,250 mg 166.7 mL/hr over 90 Minutes Intravenous Every 12 hours 08/28/16 1424 08/30/16 1637   08/27/16 0630  vancomycin (VANCOCIN) IVPB 1000 mg/200 mL premix  Status:  Discontinued     1,000 mg 200 mL/hr over 60 Minutes Intravenous Every 8 hours 08/27/16 0624 08/28/16 1424   08/27/16 0600  ceFEPIme (MAXIPIME) 1 g in dextrose 5 % 50 mL IVPB     1 g 100 mL/hr over 30 Minutes Intravenous Every 8 hours 08/26/16 2229 09/04/16 0859   08/26/16 2130  vancomycin (VANCOCIN) IVPB 1000 mg/200 mL premix     1,000 mg 200 mL/hr over 60 Minutes Intravenous  Once 08/26/16 2118 08/26/16 2317   08/26/16 2130  ceFEPIme (MAXIPIME) 2 g in dextrose 5 % 50 mL IVPB     2 g 100 mL/hr over 30 Minutes Intravenous  Once 08/26/16 2118 08/26/16 2216     Subjective: Seen and examined and was doing ok and not wearing O2 via [place]. States wheezing has improved. No nausea or vomiting. Patient denied any CP and feels better than when she came in.   Objective: Vitals:   08/30/16 1527 08/30/16 2158 08/30/16 2216 08/31/16 0504  BP: 131/84 118/72  108/69  Pulse: 80  89  74  Resp: 18 18  18   Temp: 98.7 F (37.1 C) 98.3 F (36.8 C)  98.4 F (36.9 C)  TempSrc: Oral Oral  Oral  SpO2: 98%  97% 98%  Weight:      Height:       No intake or output data in the 24 hours ending 08/31/16 0814 Filed Weights   08/26/16 2255  Weight: 110 kg (242 lb 8.1 oz)   Examination: Physical Exam:  Constitutional: 31 yo obese Caucasian female in NAD and doing better today.  Eyes: Sclerae anicteric, conjunctivae non-injected. Lids Normal ENMT: Mucous membranes moist. External Ears and Nose appear Normal and has grossly normal hearing Neck: Supple. No JVD Respiratory: Fairly clear to auscultation with mimimal wheezing. Not tachypenic or using accessory muscles to breathe. Not wearing supplemental O2 via [place]  Cardiovascular: RRR, no m/r/g. No lower  extremity edema Abdomen: Soft, NT, Distended due to body habitus. Bowel sounds present GU: Deferred Musculoskeletal: No contractures. No cyanosis Skin:  Warm and Dry; No rashes or lesions but patient has lower extremity tattoos B/L Neurologic: CN 2-12 grossly intact. No focal deficits Psychiatric: Pleasant mood and affect. Intact Judgement and Insight. A and O x3    Data Reviewed: I have personally reviewed following labs and imaging studies  CBC:  Recent Labs Lab 08/26/16 1933 08/27/16 0543 08/28/16 0621 08/29/16 0524 08/30/16 0517  WBC 14.4* 10.0 14.5* 9.7 11.3*  NEUTROABS 9.7*  --  11.9* 8.5* 9.7*  HGB 12.9 12.0 11.2* 11.9* 12.1  HCT 40.5 38.2 36.0 38.0 38.3  MCV 83.5 83.0 83.9 84.4 83.8  PLT 263 215 232 232 255   Basic Metabolic Panel:  Recent Labs Lab 08/27/16 0543 08/28/16 0621 08/29/16 0524 08/30/16 0517 08/31/16 0545  NA 134* 138 136 137 138  K 4.4 4.1 4.3 4.0 4.1  CL 103 107 101 101 103  CO2 21* 25 27 25 25   GLUCOSE 215* 201* 233* 245* 200*  BUN 12 16 18 20  26*  CREATININE 0.65 0.60 0.68 0.74 0.70  CALCIUM 8.6* 8.7* 9.1 9.0 8.7*  MG  --  1.7 1.8 1.8 1.9  PHOS  --  2.3* 2.7 2.6 3.5   GFR: Estimated Creatinine Clearance: 124.6 mL/min (by C-G formula based on SCr of 0.7 mg/dL). Liver Function Tests:  Recent Labs Lab 08/26/16 1933 08/28/16 0621 08/29/16 0524 08/30/16 0517 08/31/16 0545  AST 26 15 14* 15 18  ALT 11* 11* 13* 11* 13*  ALKPHOS 78 66 69 63 66  BILITOT 0.9 0.1* 0.3 0.3 0.3  PROT 7.7 6.3* 6.7 6.4* 6.3*  ALBUMIN 4.1 3.1* 3.4* 3.3* 3.5   No results for input(s): LIPASE, AMYLASE in the last 168 hours. No results for input(s): AMMONIA in the last 168 hours. Coagulation Profile: No results for input(s): INR, PROTIME in the last 168 hours. Cardiac Enzymes: No results for input(s): CKTOTAL, CKMB, CKMBINDEX, TROPONINI in the last 168 hours. BNP (last 3 results) No results for input(s): PROBNP in the last 8760 hours. HbA1C: No results for  input(s): HGBA1C in the last 72 hours. CBG:  Recent Labs Lab 08/30/16 0806 08/30/16 1128 08/30/16 1651 08/30/16 2156 08/31/16 0751  GLUCAP 151* 131* 214* 217* 203*   Lipid Profile: No results for input(s): CHOL, HDL, LDLCALC, TRIG, CHOLHDL, LDLDIRECT in the last 72 hours. Thyroid Function Tests: No results for input(s): TSH, T4TOTAL, FREET4, T3FREE, THYROIDAB in the last 72 hours. Anemia Panel: No results for input(s): VITAMINB12, FOLATE, FERRITIN, TIBC, IRON, RETICCTPCT in the  last 72 hours. Sepsis Labs: No results for input(s): PROCALCITON, LATICACIDVEN in the last 168 hours.  Recent Results (from the past 240 hour(s))  Culture, sputum-assessment     Status: None   Collection Time: 08/27/16  6:03 PM  Result Value Ref Range Status   Specimen Description SPUTUM  Final   Special Requests NONE  Final   Sputum evaluation THIS SPECIMEN IS ACCEPTABLE FOR SPUTUM CULTURE  Final   Report Status 08/27/2016 FINAL  Final  Culture, respiratory (NON-Expectorated)     Status: None   Collection Time: 08/27/16  6:03 PM  Result Value Ref Range Status   Specimen Description SPUTUM  Final   Special Requests NONE Reflexed from [id number]  Final   Gram Stain   Final    FEW WBC PRESENT,BOTH PMN AND MONONUCLEAR FEW SQUAMOUS EPITHELIAL CELLS PRESENT FEW GRAM POSITIVE COCCI IN PAIRS IN CLUSTERS RARE GRAM POSITIVE RODS RARE BUDDING YEAST SEEN    Culture   Final    Consistent with normal respiratory flora. Performed at [facility], [address] [place], [place] [address]    Report Status 08/30/2016 FINAL  Final    Radiology Studies: No results found. Scheduled Meds: . budesonide (PULMICORT) nebulizer solution  0.25 mg Nebulization BID  . gabapentin  900 mg Oral BID  . guaiFENesin  1,200 mg Oral BID  . heparin subcutaneous  5,000 Units Subcutaneous Q8H  . insulin aspart  0-9 Units Subcutaneous TID WC  . ipratropium-albuterol  3 mL Nebulization QID  . methylPREDNISolone (SOLU-MEDROL)  injection  60 mg Intravenous Q8H  . metoprolol succinate  25 mg Oral Daily  . montelukast  10 mg Oral QHS  . naltrexone  50 mg Oral Daily  . nicotine  14 mg Transdermal Daily  . pantoprazole  40 mg Oral Daily  . QUEtiapine  100 mg Oral QHS  . sertraline  75 mg Oral Daily  . thiamine  100 mg Oral Daily   Continuous Infusions: . ceFEPime (MAXIPIME) IV Stopped (08/31/16 0145)    LOS: 5 days   Merlene Laughter, DO [facility] Pager [phone]  If 7PM-7AM, please contact night-coverage [email or web address] Password [email or web address] 08/31/2016, 8:14 AM

## 2016-09-01 LAB — COMPREHENSIVE METABOLIC PANEL
ALT: 13 U/L — ABNORMAL LOW (ref 14–54)
ANION GAP: 8 (ref 5–15)
AST: 13 U/L — ABNORMAL LOW (ref 15–41)
Albumin: 3 g/dL — ABNORMAL LOW (ref 3.5–5.0)
Alkaline Phosphatase: 55 U/L (ref 38–126)
BUN: 27 mg/dL — ABNORMAL HIGH (ref 6–20)
CALCIUM: 8.6 mg/dL — AB (ref 8.9–10.3)
CHLORIDE: 103 mmol/L (ref 101–111)
CO2: 27 mmol/L (ref 22–32)
Creatinine, Ser: 0.71 mg/dL (ref 0.44–1.00)
GFR calc non Af Amer: >60 mL/min (ref >60)
Glucose, Bld: 139 mg/dL — ABNORMAL HIGH (ref 65–99)
Potassium: 4 mmol/L (ref 3.5–5.1)
SODIUM: 138 mmol/L (ref 135–145)
Total Bilirubin: 0.3 mg/dL (ref 0.3–1.2)
Total Protein: 6.1 g/dL — ABNORMAL LOW (ref 6.5–8.1)

## 2016-09-01 LAB — PHOSPHORUS: PHOSPHORUS: 2.9 mg/dL (ref 2.5–4.6)

## 2016-09-01 LAB — CBC WITH DIFFERENTIAL/PLATELET
Basophils Absolute: 0 10*3/uL (ref 0.0–0.1)
Basophils Relative: 0 %
EOS ABS: 0 10*3/uL (ref 0.0–0.7)
EOS PCT: 0 %
HCT: 39.1 % (ref 36.0–46.0)
Hemoglobin: 12.1 g/dL (ref 12.0–15.0)
LYMPHS ABS: 2.2 10*3/uL (ref 0.7–4.0)
Lymphocytes Relative: 18 %
MCH: 26.1 pg (ref 26.0–34.0)
MCHC: 30.9 g/dL (ref 30.0–36.0)
MCV: 84.3 fL (ref 78.0–100.0)
Monocytes Absolute: 1 10*3/uL (ref 0.1–1.0)
Monocytes Relative: 8 %
Neutro Abs: 8.9 10*3/uL — ABNORMAL HIGH (ref 1.7–7.7)
Neutrophils Relative %: 74 %
PLATELETS: 227 10*3/uL (ref 150–400)
RBC: 4.64 MIL/uL (ref 3.87–5.11)
RDW: 15 % (ref 11.5–15.5)
WBC: 12.2 10*3/uL — ABNORMAL HIGH (ref 4.0–10.5)

## 2016-09-01 LAB — GLUCOSE, CAPILLARY
GLUCOSE-CAPILLARY: 178 mg/dL — AB (ref 65–99)
Glucose-Capillary: 139 mg/dL — ABNORMAL HIGH (ref 65–99)

## 2016-09-01 LAB — RHEUMATOID FACTOR: Rhuematoid fact SerPl-aCnc: 10.1 IU/mL (ref 0.0–13.9)

## 2016-09-01 LAB — IGE: IgE (Immunoglobulin E), Serum: 902 IU/mL — ABNORMAL HIGH (ref 0–100)

## 2016-09-01 LAB — MAGNESIUM: MAGNESIUM: 1.9 mg/dL (ref 1.7–2.4)

## 2016-09-01 LAB — HYPERSENSITIVITY PNEUMONITIS
A. FUMIGATUS #1 ABS: NEGATIVE
A. Pullulans Abs: NEGATIVE
MICROPOLYSPORA FAENI IGG: NEGATIVE
Pigeon Serum Abs: NEGATIVE
Thermoact. Saccharii: NEGATIVE
Thermoactinomyces vulgaris, IgG: NEGATIVE

## 2016-09-01 LAB — ANA W/REFLEX IF POSITIVE: Anti Nuclear Antibody(ANA): NEGATIVE

## 2016-09-01 MED ORDER — PREDNISONE 20 MG PO TABS: 40.0000 mg | ORAL_TABLET | Freq: Every day | ORAL | Status: DC

## 2016-09-01 MED ORDER — MELOXICAM 15 MG PO TABS: 15.0000 mg | ORAL_TABLET | Freq: Every day | ORAL | 0 refills | Status: DC | PRN

## 2016-09-01 MED ORDER — PREDNISONE 10 MG PO TABS: ORAL_TABLET | ORAL | 0 refills | Status: DC

## 2016-09-01 MED ORDER — LEVOFLOXACIN 750 MG PO TABS: 750.0000 mg | ORAL_TABLET | Freq: Every day | ORAL | 0 refills | Status: AC

## 2016-09-01 NOTE — Progress Notes (Addendum)
31 year old smoker with recurrent attacks of Cough wheezing and shortness of breath with bilateral groundglass infiltrates. She had  admissions in February and then again in 07/2016 For pneumonia.  SUBJECTIVE: Decreased wheezing, able to ambulate C/o Cough -green sputum  On exam-Obese, getting neb,afebrile, able to lie supine in bed, decreased breath sounds bilateral, no rhonchi, S1-S2 normal, no edema  Labs CBGs 200 range Absolute eosinophil count 1400 on admission Chest x-ray 5/15 partial clearing of BLL bilateral airspace disease  ANA neg in 2016  Impression- Being treated as HCAP, severe asthma exacerbation Differential diagnosis includes Eosinophilic pneumonia, hypersensitivity pneumonitis, doubt RB-ILD  Recommend- Decrease pred 40 mg daily - suggest extended taper by 10 mg q week Office visit scheduled in one week - at that point repeat chest x-ray to decide need for bronchoscopy and consider scheduling PFTs. Note that she could perform PFTs in the hospital  Since green sputum persists , can change to levaquin on discharge x 5 more days Repeat ANA for completion, hypersens panel pending  Smoking cessation emphasized-she will trial nicotine patch, consider Nicotrol inhaler if she fails this Updated patient and mother at bedside  Oretha MilchALVA,Cipriano Millikan V. MD

## 2016-09-01 NOTE — Discharge Summary (Signed)
Discharge Summary  Kara [name] ZOX:[id number] DOB: [birth year]  PCP: [name], MD  Admit date: 08/26/2016 Discharge date: 09/01/2016  Time spent: <67mins  Recommendations for Outpatient Follow-up:  1. F/u with PMD within a week  for hospital discharge follow up, repeat cbc/bmp at follow up 2. F/u with pulmonology on 5/24 3. F/u with allergy/immunology on 5/22  Discharge Diagnoses:  Active Hospital Problems   Diagnosis Date Noted  . Acute respiratory failure with hypoxia (HCC) 08/26/2016  . Hyponatremia 08/27/2016  . Hyperglycemia 08/27/2016  . HCAP (healthcare-associated pneumonia) 07/31/2016  . Tobacco abuse 07/31/2016  . Respiratory failure with hypoxia (HCC) 07/31/2016  . Alcohol abuse, in remission 06/12/2016  . Asthma exacerbation 06/12/2016  . Benign essential HTN 09/09/2015  . GERD (gastroesophageal reflux disease) 09/09/2015  . Hypertension 02/10/2015    Resolved Hospital Problems   Diagnosis Date Noted Date Resolved  No resolved problems to display.    Discharge Condition: stable  Diet recommendation: heart healthy  Filed Weights   08/26/16 2255  Weight: 110 kg (242 lb 8.1 oz)    History of present illness:  PCP: [name], MD  Patient coming from: Home.  Chief Complaint: Shortness of breath.  HPI: Kara [name] is a 31 y.o. female with known history of bronchial asthma, ongoing tobacco abuse presents to the ER with shortness of breath and wheezing over the last 3 days. Patient also states productive cough but denies any hemoptysis. Has been having subjective feeling of fever chills. Denies any chest pain. This is the third admission for the patient is last 2 months for shortness of breath and possible pneumonia. Patient denies any sick contacts or recent travel. Denies any mold in the house or any birds.   ED Course: In the ER patient was found to be wheezing and short of breath but able to complete sentences. Patient also was  hypoxic. Chest x-ray shows persistent bilateral infiltrates which was worsening in the right side. Patient states she just recently completed antibiotics and prednisone until then patient was doing fine. Once the course of antibiotics and steroids for over patient started developing wheezing again.  Hospital Course:  Principal Problem:   Acute respiratory failure with hypoxia (HCC) Active Problems:   Hypertension   GERD (gastroesophageal reflux disease)   Benign essential HTN   Asthma exacerbation   Alcohol abuse, in remission   HCAP (healthcare-associated pneumonia)   Tobacco abuse   Respiratory failure with hypoxia (HCC)   Hyponatremia   Hyperglycemia    Acute hypoxic respiratory failure/Health-Care Associated Pneumonia with likely concomitant Severe Asthma Exacerbation -this is her third hospitalization for pneumonia this year, Was found to be hypoxic at 84% on Room Air on Admission --CXR 5/10 showed Persistent bilateral heterogeneous airspace opacities, progressed at the right lung base. In conjunction with prior chest CT, findings can be seen in the setting of pulmonary hemorrhage or interstitial lung disease. Atypical infection could have a similar appearance. -Obtained SLP to evaluate for Aspiration and SLP Clinician feels like it is very unlikely the patient's PNA's were caused by Aspiration from foods and liquids , she does has history of alcohol use, but in remission, she denies dental issues -Checked Urine Legionella and Strep Ag and were Negative, sputum with normal respiratory flora, hypersensitivity pneumonitis panel pending at discharge Repeat ana, Serum IgE, Aspergillus specific IgE, Checking Sputum for Fungus, Bacteria, AFB as well as Hypersensitivity Pneumonitis Profile and RAST Profile all PENDING. -she received Broad Spectrum Abx with IV Vancomycin and cefepime  initially, then cefepime only, she is discharged on oral levqauin for 5 days  per pulmonology recommendations  due to persistent green sputum, she is discharged on steroids taper, and continue nebs prn. --Pulmonary recommended Lung Fxn testing in the Hospital and done today; Will need Repeat PFT's as an outpatient as well and possible bronchoscopy for cell count, fungal Cx and potential Biopsies.  -her symptom has much improved, she ambulated on room air, o2 remain above 90%. No wheezing, no rhonchi , no rales at discharge, although lung sound still diminished at basis.  Mild Hyponatremia, resolved -Patient's Na+ was 134 and improved to 138 -Was on IV NS at 75 mL/hr for 12 hours and IVF now stopped.  Hyperglycemia -In the setting of IV Steroid Use -Last HbA1c was 5.6 on 08/01/16 -she received ssi in the hospital, expect improvement with taper steroids  Essential Hypertension and Hx of Prolonged QT - Continue Metoprolol Succinate 25 mg po Daily -she is followed by cardiology  Alcohol abuse, in remission -Continue Naltrexone 50 mg po Daily and Thiamine 100 mg po Daily She is followed by her psychiatrist for this.   Tobacco abuse: - Patient reports smoking approximately 1/2 pack of cigarettes per day on average  - Counseled on theneed ofcessation of tobacco use - C/w Nicotine patch 14 mg TD q24h  Anxiety - Continue Sertraline 75 mg po Daily  Insomnia - Continue Quetiapine 100 mg po Daily qhS  GERD - resume home meds, change meloxicam from daily use to prn use   Morbid obesity: Body mass index is 40.36 kg/m.   DVT prophylaxis while in the hospital: SCDs and Heparin 5,000 units sq q8h Code Status: FULL CODE Family Communication: Mother updated at bedside Disposition Plan:  Home   Consultants:   Pulmonary Dr. Vassie Loll   Procedures: None   Discharge Exam: BP 109/61 (BP Location: Left Arm)   Pulse 87   Temp 98.2 F (36.8 C) (Oral)   Resp 18   Ht 5\' 5"  (1.651 m)   Wt 110 kg (242 lb 8.1 oz)   LMP 08/06/2016   SpO2 98%   BMI 40.36 kg/m   General:  NAD Cardiovascular: RRR Respiratory: decreased breath sounds, but no wheezing, no rhonchi , no rales  Discharge Instructions You were cared for by a hospitalist during your hospital stay. If you have any questions about your discharge medications or the care you received while you were in the hospital after you are discharged, you can call the unit and asked to speak with the hospitalist on call if the hospitalist that took care of you is not available. Once you are discharged, your primary care physician will handle any further medical issues. Please note that NO REFILLS for any discharge medications will be authorized once you are discharged, as it is imperative that you return to your primary care physician (or establish a relationship with a primary care physician if you do not have one) for your aftercare needs so that they can reassess your need for medications and monitor your lab values.  Discharge Instructions    Diet - low sodium heart healthy    Complete by:  As directed    Increase activity slowly    Complete by:  As directed      Allergies as of 09/01/2016      Reactions   Peanuts [peanut Oil] Hives, Itching      Medication List    STOP taking these medications   predniSONE 10 MG (21) Tbpk  tablet Commonly known as:  STERAPRED UNI-PAK 21 TAB Replaced by:  predniSONE 10 MG tablet     TAKE these medications   albuterol 108 (90 Base) MCG/ACT inhaler Commonly known as:  PROVENTIL HFA;VENTOLIN HFA Inhale 2 puffs into the lungs every 6 (six) hours as needed for wheezing or shortness of breath.   albuterol (2.5 MG/3ML) 0.083% nebulizer solution Commonly known as:  PROVENTIL INHALE THE CONTENTS OF 1VIAL VIA NEBULIZER Q 4-6 H PRN   budesonide-formoterol 160-4.5 MCG/ACT inhaler Commonly known as:  SYMBICORT Inhale 2 puffs into the lungs 2 (two) times daily.   chlorhexidine 0.12 % solution Commonly known as:  PERIDEX Use as directed 15 mLs in the mouth or throat 2 (two)  times daily. After meals   EPIPEN 2-PAK 0.3 mg/0.3 mL Soaj injection Generic drug:  EPINEPHrine Inject 0.3 mg into the muscle once as needed (for severe allergic reaction).   gabapentin 300 MG capsule Commonly known as:  NEURONTIN Take 900 mg by mouth 2 (two) times daily.   guaiFENesin 600 MG 12 hr tablet Commonly known as:  MUCINEX Take 2 tablets (1,200 mg total) by mouth 2 (two) times daily.   ipratropium-albuterol 0.5-2.5 (3) MG/3ML Soln Commonly known as:  DUONEB USE 1 VIAL PER NEBULIZER EVERY 6 HOURS  FOR 30 DAYS   levofloxacin 750 MG tablet Commonly known as:  LEVAQUIN Take 1 tablet (750 mg total) by mouth daily.   Melatonin 10 MG Tabs Take 10 mg by mouth at bedtime.   meloxicam 15 MG tablet Commonly known as:  MOBIC Take 1 tablet (15 mg total) by mouth daily as needed for pain. What changed:  when to take this  reasons to take this   metoprolol succinate 25 MG 24 hr tablet Commonly known as:  TOPROL-XL Take 1 tablet (25 mg total) by mouth daily. *Please call and schedule a one year follow up appointment*   montelukast 10 MG tablet Commonly known as:  SINGULAIR TAKE 1 TABLET BY MOUTH DAILY What changed:  See the new instructions.   multivitamin with minerals tablet Take 1 tablet by mouth daily.   naltrexone 50 MG tablet Commonly known as:  DEPADE Take 50 mg by mouth daily.   omeprazole 40 MG capsule Commonly known as:  PRILOSEC TAKE 1 CAPSULE(40 MG) BY MOUTH DAILY   predniSONE 10 MG tablet Commonly known as:  DELTASONE Label  & dispense according to the schedule below.  4Pills PO for one week, then 3pills for one week, then 2pills for one week, then 1pill for one week. Further steroids taper per pulmonologist. Replaces:  predniSONE 10 MG (21) Tbpk tablet   QUEtiapine 50 MG tablet Commonly known as:  SEROQUEL Take 100 mg by mouth at bedtime.   sertraline 50 MG tablet Commonly known as:  ZOLOFT Take 75 mg by mouth daily.   thiamine 100 MG  tablet Take 100 mg by mouth daily.   VITAMIN D PO Take 1 tablet by mouth daily.      Allergies  Allergen Reactions  . Peanuts [Peanut Oil] Hives and Itching   Follow-up Information    Parrett, Tammy S, NP Follow up on 09/09/2016.   Specialty:  Pulmonary Disease Why:  at 330 pm  Contact information: [address] [place] [address] [phone]        Daisy Florooss, Charles Alan, MD Follow up in 1 week(s).   Specialty:  Family Medicine Why:  hospital discharge follow up, repeat cbc/bmp at follow up. Contact information: [address] [place] [address] [phone]            The results of significant diagnostics from this hospitalization (including imaging, microbiology, ancillary and laboratory) are listed below for reference.    Significant Diagnostic Studies: Dg Chest 2 View  Result Date: 08/31/2016 CLINICAL DATA:  Recent pneumonia.  Hypertension. EXAM: CHEST  2 VIEW COMPARISON:  Aug 26, 2016 FINDINGS: There remains mild patchy infiltrate in the lung bases, slightly less than on recent study. Lungs elsewhere are clear. Heart is upper normal in size with pulmonary vascularity within normal limits. No adenopathy. No bone lesions. IMPRESSION: Partial clearing of patchy infiltrate from the bases bilaterally. A small amount of residual opacity remains in the bases. Lungs elsewhere clear. Stable cardiac silhouette. Electronically Signed   By: Bretta Bang III M.D.   On: 08/31/2016 08:25   Dg Chest 2 View  Result Date: 08/26/2016 CLINICAL DATA:  Shortness of breath.  Productive cough. EXAM: CHEST  2 VIEW COMPARISON:  Most recent radiographs 08/01/2016, chest CT 06/13/2016 FINDINGS: Unchanged heart size and mediastinal contours. There are persistent bilateral heterogeneous airspace opacities in a lower lung zone predominant distribution. Assess slightly progressed at the right lung base. Possible trace right pleural effusion. No pneumothorax. No acute osseous  abnormalities. IMPRESSION: Persistent bilateral heterogeneous airspace opacities, progressed at the right lung base. In conjunction with prior chest CT, findings can be seen in the setting of pulmonary hemorrhage or interstitial lung disease. Atypical infection could have a similar appearance. Electronically Signed   By: Rubye Oaks M.D.   On: 08/26/2016 20:42   Dg Swallowing Func-speech Pathology  Result Date: 08/28/2016 Objective Swallowing Evaluation: Type of Study: MBS-Modified Barium Swallow Study Patient Details Name: Layani Foronda MRN: [id number] Date of Birth: 1985-04-22 Today's Date: 08/28/2016 Time: SLP Start Time (ACUTE ONLY): 1455-SLP Stop Time (ACUTE ONLY): 1505 SLP Time Calculation (min) (ACUTE ONLY): 10 min Past Medical History: Past Medical History: Diagnosis Date . Alcohol abuse  . Asthma  . Environmental allergies  . GERD (gastroesophageal reflux disease)  . Headache  . Hypertension  . Pancreatitis  . Prolonged QT syndrome  . Syncope and collapse  . Vision abnormalities  Past Surgical History: Past Surgical History: Procedure Laterality Date . ESOPHAGOGASTRODUODENOSCOPY N/A 07/17/2014  Procedure: ESOPHAGOGASTRODUODENOSCOPY (EGD);  Surgeon: Dorena Cookey, MD;  Location: [facility];  Service: Endoscopy;  Laterality: N/A; . TYMPANOSTOMY TUBE PLACEMENT   HPI: Patient is a 31 y.o. female with PMH: bronchial asthma, ongoing tobacco abuse, alcohol abuse, GERD, pancreatitis, HTN, headache, syncope and collapse, environmental allergies. She has been admitted two other times in the past two months for SOB and possible PNA. CXR revealed bilateral infiltrates, worsening on the right side. MD orderded MBS to r/o aspiration as the cause of patient's recurrent PNA's. Subjective: pleasant, cooperative, no complaints Assessment / Plan / Recommendation CHL IP CLINICAL IMPRESSIONS 08/28/2016 Clinical Impression Patient presents with an oropharyngeal swallow that is largely [facility]. She exhibited one instance of flash  penetration with large volume sip of thin liquids from cup, but did not exhibit any other incidence of penetration or aspiration. Patient with timely swallow initiation, full clearance of all tested boluses (puree, hard solid, pill, thin liquids) with no residuals remaining in laryngeal vestibule post swallow. As per esophageal sweep, pill transited upper esophagus without difficulty. Based on this study, SLP's clinician judgement is that it would be very unlikely that patient's PNA's were caused by aspiration from foods and liquids she eats. Although no evidence of retrograde  movement of liquids or solids was observed during this study, cannot r/o possibility of aspiration from reflux, but this seems unlikely. SLP Visit Diagnosis Dysphagia, pharyngeal phase (R13.13) Attention and concentration deficit following -- Frontal lobe and executive function deficit following -- Impact on safety and function No limitations   CHL IP TREATMENT RECOMMENDATION 08/28/2016 Treatment Recommendations No treatment recommended at this time   No flowsheet data found. CHL IP DIET RECOMMENDATION 08/28/2016 SLP Diet Recommendations Regular solids;Thin liquid Liquid Administration via Cup;Straw Medication Administration Whole meds with liquid Compensations -- Postural Changes Seated upright at 90 degrees   No flowsheet data found.  CHL IP FOLLOW UP RECOMMENDATIONS 08/28/2016 Follow up Recommendations None   No flowsheet data found.     CHL IP ORAL PHASE 08/28/2016 Oral Phase WFL Oral - Pudding Teaspoon -- Oral - Pudding Cup -- Oral - Honey Teaspoon -- Oral - Honey Cup -- Oral - Nectar Teaspoon -- Oral - Nectar Cup -- Oral - Nectar Straw -- Oral - Thin Teaspoon -- Oral - Thin Cup -- Oral - Thin Straw -- Oral - Puree -- Oral - Mech Soft -- Oral - Regular -- Oral - Multi-Consistency -- Oral - Pill -- Oral Phase - Comment --  CHL IP PHARYNGEAL PHASE 08/28/2016 Pharyngeal Phase Impaired Pharyngeal- Pudding Teaspoon -- Pharyngeal -- Pharyngeal-  Pudding Cup -- Pharyngeal -- Pharyngeal- Honey Teaspoon -- Pharyngeal -- Pharyngeal- Honey Cup -- Pharyngeal -- Pharyngeal- Nectar Teaspoon -- Pharyngeal -- Pharyngeal- Nectar Cup -- Pharyngeal -- Pharyngeal- Nectar Straw -- Pharyngeal -- Pharyngeal- Thin Teaspoon -- Pharyngeal -- Pharyngeal- Thin Cup Reduced airway/laryngeal closure Pharyngeal Material enters airway, remains ABOVE vocal cords then ejected out Pharyngeal- Thin Straw -- Pharyngeal -- Pharyngeal- Puree WFL Pharyngeal -- Pharyngeal- Mechanical Soft NT Pharyngeal -- Pharyngeal- Regular WFL Pharyngeal -- Pharyngeal- Multi-consistency NT Pharyngeal -- Pharyngeal- Pill WFL Pharyngeal -- Pharyngeal Comment Only one instance of flash penetration with large sip of thin liquids  CHL IP CERVICAL ESOPHAGEAL PHASE 08/28/2016 Cervical Esophageal Phase WFL Pudding Teaspoon -- Pudding Cup -- Honey Teaspoon -- Honey Cup -- Nectar Teaspoon -- Nectar Cup -- Nectar Straw -- Thin Teaspoon -- Thin Cup -- Thin Straw -- Puree -- Mechanical Soft -- Regular -- Multi-consistency -- Pill -- Cervical Esophageal Comment -- Angela Nevin, MA, CCC-SLP 08/28/16 3:33 PM               Microbiology: Recent Results (from the past 240 hour(s))  Culture, sputum-assessment     Status: None   Collection Time: 08/27/16  6:03 PM  Result Value Ref Range Status   Specimen Description SPUTUM  Final   Special Requests NONE  Final   Sputum evaluation THIS SPECIMEN IS ACCEPTABLE FOR SPUTUM CULTURE  Final   Report Status 08/27/2016 FINAL  Final  Culture, respiratory (NON-Expectorated)     Status: None   Collection Time: 08/27/16  6:03 PM  Result Value Ref Range Status   Specimen Description SPUTUM  Final   Special Requests NONE Reflexed from [id number]  Final   Gram Stain   Final    FEW WBC PRESENT,BOTH PMN AND MONONUCLEAR FEW SQUAMOUS EPITHELIAL CELLS PRESENT FEW GRAM POSITIVE COCCI IN PAIRS IN CLUSTERS RARE GRAM POSITIVE RODS RARE BUDDING YEAST SEEN    Culture   Final     Consistent with normal respiratory flora. Performed at [facility], [address] [place], [place] [address]    Report Status 08/30/2016 FINAL  Final  Acid Fast Smear (AFB)  Status: None   Collection Time: 08/30/16 12:00 PM  Result Value Ref Range Status   AFB Specimen Processing Concentration  Final   Acid Fast Smear Negative  Final    Comment: (NOTE) Performed At: [facility] [address] [place], [place] [id number] Mila Homer MD WU:[id number]    Source (AFB) EXPECTORATED SPUTUM  Final     Labs: Basic Metabolic Panel:  Recent Labs Lab 08/28/16 0621 08/29/16 0524 08/30/16 0517 08/31/16 0545 09/01/16 0547  NA 138 136 137 138 138  K 4.1 4.3 4.0 4.1 4.0  CL 107 101 101 103 103  CO2 25 27 25 25 27   GLUCOSE 201* 233* 245* 200* 139*  BUN 16 18 20  26* 27*  CREATININE 0.60 0.68 0.74 0.70 0.71  CALCIUM 8.7* 9.1 9.0 8.7* 8.6*  MG 1.7 1.8 1.8 1.9 1.9  PHOS 2.3* 2.7 2.6 3.5 2.9   Liver Function Tests:  Recent Labs Lab 08/28/16 0621 08/29/16 0524 08/30/16 0517 08/31/16 0545 09/01/16 0547  AST 15 14* 15 18 13*  ALT 11* 13* 11* 13* 13*  ALKPHOS 66 69 63 66 55  BILITOT 0.1* 0.3 0.3 0.3 0.3  PROT 6.3* 6.7 6.4* 6.3* 6.1*  ALBUMIN 3.1* 3.4* 3.3* 3.5 3.0*   No results for input(s): LIPASE, AMYLASE in the last 168 hours. No results for input(s): AMMONIA in the last 168 hours. CBC:  Recent Labs Lab 08/28/16 0621 08/29/16 0524 08/30/16 0517 08/31/16 0906 09/01/16 0547  WBC 14.5* 9.7 11.3* 10.1 12.2*  NEUTROABS 11.9* 8.5* 9.7* 8.8* 8.9*  HGB 11.2* 11.9* 12.1 12.9 12.1  HCT 36.0 38.0 38.3 40.7 39.1  MCV 83.9 84.4 83.8 83.4 84.3  PLT 232 232 255 231 227   Cardiac Enzymes: No results for input(s): CKTOTAL, CKMB, CKMBINDEX, TROPONINI in the last 168 hours. BNP: BNP (last 3 results)  Recent Labs  06/12/16 2136  BNP 21.7    ProBNP (last 3 results) No results for input(s): PROBNP in the last 8760 hours.  CBG:  Recent Labs Lab  08/31/16 0751 08/31/16 1134 08/31/16 1739 08/31/16 2106 09/01/16 0738  GLUCAP 203* 253* 143* 247* 178*       Signed:  Georgenia Salim MD, PhD  [facility] 09/01/2016, 12:09 PM

## 2016-09-01 NOTE — Care Management Note (Signed)
Case Management Note  Patient Details  Name: Kara [name] MRN: [id number] Date of Birth: 11/08/1985  Subjective/Objective:  D/c home no CM needs.                  Action/Plan:d/c home.   Expected Discharge Date:  09/01/16               Expected Discharge Plan:  Home/Self Care  In-House Referral:     Discharge planning Services  CM Consult  Post Acute Care Choice:    Choice offered to:     DME Arranged:    DME Agency:     HH Arranged:    HH Agency:     Status of Service:  Completed, signed off  If discussed at [employer] Length of Stay Meetings, dates discussed:    Additional Comments:  Lanier ClamMahabir, Umberto Pavek, RN 09/01/2016, 11:54 AM

## 2016-09-07 ENCOUNTER — Encounter: Payer: Self-pay | Admitting: Allergy and Immunology

## 2016-09-07 ENCOUNTER — Ambulatory Visit (INDEPENDENT_AMBULATORY_CARE_PROVIDER_SITE_OTHER): Payer: BC Managed Care – PPO | Admitting: Allergy and Immunology

## 2016-09-07 VITALS — BP 142/98 | HR 96 | Resp 20

## 2016-09-07 DIAGNOSIS — D721 Eosinophilia, unspecified: Secondary | ICD-10-CM

## 2016-09-07 DIAGNOSIS — B999 Unspecified infectious disease: Secondary | ICD-10-CM | POA: Diagnosis not present

## 2016-09-07 DIAGNOSIS — J4551 Severe persistent asthma with (acute) exacerbation: Secondary | ICD-10-CM

## 2016-09-07 DIAGNOSIS — H1010 Acute atopic conjunctivitis, unspecified eye: Secondary | ICD-10-CM | POA: Diagnosis not present

## 2016-09-07 DIAGNOSIS — J309 Allergic rhinitis, unspecified: Secondary | ICD-10-CM

## 2016-09-07 DIAGNOSIS — F17210 Nicotine dependence, cigarettes, uncomplicated: Secondary | ICD-10-CM

## 2016-09-07 DIAGNOSIS — K219 Gastro-esophageal reflux disease without esophagitis: Secondary | ICD-10-CM

## 2016-09-07 DIAGNOSIS — Z91018 Allergy to other foods: Secondary | ICD-10-CM | POA: Diagnosis not present

## 2016-09-07 NOTE — Progress Notes (Signed)
Follow-up Note  Referring Provider: [name], MD Primary Provider: [name], MD Date of Office Visit: 09/07/2016  Subjective:   Kara [name] (DOB: 08/01/1985) is a 31 y.o. female who [facility] in re-evaluation of the following:  HPI: Kara [name] presents to this clinic in evaluation of her severe asthma, recurrent infections, allergic rhinitis, reflux, tobacco use, and food allergy. I have not seen her in this clinic in almost 2 years.  What has been significant for Kara [name] over the course of the past 3 months is the development of a 3 pneumonia scenario that has required 3 hospitalizations and 3 courses of systemic steroids. Apparently she does well in the hospital with antibiotics and steroids and then soon after discontinuing her steroids she redeveloped significant issues with coughing and wheezing and shortness of breath and fever. She really has no associated systemic or constitutional symptoms to suggest other organ involvement with an inflammatory process and all of her inflammation appear to be confined to her lower respiratory tract.  During her most recent hospitalization she was discharged on a prolonged systemic steroid taper and she still has an additional 3 weeks of prednisone taper to complete. She is much better since that hospitalization yet still occasionally has the sensation of some chest tightness even while continuing on prednisone and continuing on other anti-inflammatory agents for her chest.  She does not really note any obvious environmental change that has occurred that may give rise to her respiratory tract inflammation. She is not on any new medications and she has not really started a new hobby and she does not have a new pet nor does she have exposure to birds and her home and her worksite have not really changed.  Evaluation to date has included examination for hypersensitivity pneumonitis, fungal  infection, mycobacterial infection, bacterial infection, aspiration, and autoimmune disease, which have been negative. She has not had a bronchoscopy performed during her acute illness.  She still continues to smoke a little less than 10 cigarettes per day. She did have a issue with alcohol use in the past but that has discontinued since December 2017 after a inpatient program. Her reflux is under good control while using her omeprazole. She remains away from peanut consumption based upon her previous reaction to this food product.  Allergies as of 09/07/2016      Reactions   Peanuts [peanut Oil] Hives, Itching      Medication List      albuterol 108 (90 Base) MCG/ACT inhaler Commonly known as:  PROVENTIL HFA;VENTOLIN HFA Inhale 2 puffs into the lungs every 6 (six) hours as needed for wheezing or shortness of breath.   albuterol (2.5 MG/3ML) 0.083% nebulizer solution Commonly known as:  PROVENTIL INHALE THE CONTENTS OF 1VIAL VIA NEBULIZER Q 4-6 H PRN   budesonide-formoterol 160-4.5 MCG/ACT inhaler Commonly known as:  SYMBICORT Inhale 2 puffs into the lungs 2 (two) times daily.   chlorhexidine 0.12 % solution Commonly known as:  PERIDEX Use as directed 15 mLs in the mouth or throat 2 (two) times daily. After meals   EPIPEN 2-PAK 0.3 mg/0.3 mL Soaj injection Generic drug:  EPINEPHrine Inject 0.3 mg into the muscle once as needed (for severe allergic reaction).   gabapentin 300 MG capsule Commonly known as:  NEURONTIN Take 900 mg by mouth 2 (two) times daily.   guaiFENesin 600 MG 12 hr tablet Commonly known as:  MUCINEX Take 2 tablets (1,200 mg  total) by mouth 2 (two) times daily.   ipratropium-albuterol 0.5-2.5 (3) MG/3ML Soln Commonly known as:  DUONEB USE 1 VIAL PER NEBULIZER EVERY 6 HOURS  FOR 30 DAYS   Melatonin 10 MG Tabs Take 10 mg by mouth at bedtime.   meloxicam 15 MG tablet Commonly known as:  MOBIC Take 1 tablet (15 mg total) by mouth daily as needed for  pain.   metoprolol succinate 25 MG 24 hr tablet Commonly known as:  TOPROL-XL Take 1 tablet (25 mg total) by mouth daily. *Please call and schedule a one year follow up appointment*   montelukast 10 MG tablet Commonly known as:  SINGULAIR TAKE 1 TABLET BY MOUTH DAILY   multivitamin with minerals tablet Take 1 tablet by mouth daily.   naltrexone 50 MG tablet Commonly known as:  DEPADE Take 50 mg by mouth daily.   omeprazole 40 MG capsule Commonly known as:  PRILOSEC TAKE 1 CAPSULE(40 MG) BY MOUTH DAILY   predniSONE 10 MG tablet Commonly known as:  DELTASONE Label  & dispense according to the schedule below.  4Pills PO for one week, then 3pills for one week, then 2pills for one week, then 1pill for one week. Further steroids taper per pulmonologist.   QUEtiapine 50 MG tablet Commonly known as:  SEROQUEL Take 100 mg by mouth at bedtime.   sertraline 50 MG tablet Commonly known as:  ZOLOFT Take 75 mg by mouth daily.   thiamine 100 MG tablet Take 100 mg by mouth daily.   VITAMIN D PO Take 1 tablet by mouth daily.       Past Medical History:  Diagnosis Date  . Alcohol abuse   . Asthma   . Environmental allergies   . GERD (gastroesophageal reflux disease)   . Headache   . Hypertension   . Pancreatitis   . Prolonged QT syndrome   . Syncope and collapse   . Vision abnormalities     Past Surgical History:  Procedure Laterality Date  . ESOPHAGOGASTRODUODENOSCOPY N/A 07/17/2014   Procedure: ESOPHAGOGASTRODUODENOSCOPY (EGD);  Surgeon: Dorena CookeyJohn Hayes, MD;  Location: [facility];  Service: Endoscopy;  Laterality: N/A;  . TYMPANOSTOMY TUBE PLACEMENT      Review of systems negative except as noted in HPI / PMHx or noted below:  Review of Systems  Constitutional: Negative.   HENT: Negative.   Eyes: Negative.   Respiratory: Negative.   Cardiovascular: Negative.   Gastrointestinal: Negative.   Genitourinary: Negative.   Musculoskeletal: Negative.   Skin: Negative.    Neurological: Negative.   Endo/Heme/Allergies: Negative.   Psychiatric/Behavioral: Negative.      Objective:   Vitals:   09/07/16 1720  BP: (!) 142/98  Pulse: 96  Resp: 20          Physical Exam  Constitutional: She is well-developed, well-nourished, and in no distress.  Moon face  HENT:  Head: Normocephalic.  Right Ear: External ear and ear canal normal. Tympanic membrane is scarred.  Left Ear: External ear and ear canal normal. Tympanic membrane is scarred.  Nose: Nose normal. No mucosal edema or rhinorrhea.  Mouth/Throat: Uvula is midline, oropharynx is clear and moist and mucous membranes are normal. No oropharyngeal exudate.  Eyes: Conjunctivae are normal.  Neck: Trachea normal. No tracheal tenderness present. No tracheal deviation present. No thyromegaly present.  Cardiovascular: Normal rate, regular rhythm, S1 normal, S2 normal and normal heart sounds.   No murmur heard. Pulmonary/Chest: Breath sounds normal. No stridor. No respiratory distress. She has no wheezes.  She has no rales.  Musculoskeletal: She exhibits no edema.  Lymphadenopathy:       Head (right side): No tonsillar adenopathy present.       Head (left side): No tonsillar adenopathy present.    She has no cervical adenopathy.  Neurological: She is alert. Gait normal.  Skin: No rash noted. She is not diaphoretic. No erythema. Nails show no clubbing.  Psychiatric: Mood and affect normal.    Diagnostics: Results of blood tests obtained on 08/26/2016 identified a white blood cell count of 14.4 with absolute eosinophil count of 1400, lymphocyte count 2500, hemoglobin 12.9, and platelet 263.  Results of blood tests obtained 08/27/2016 identified negative Aspergillus precipitating antibodies and negative hypersensitivity pneumonitis screen.  Results of blood tests obtained on 08/31/2016 identified negative ANA and negative RA  Results of blood tests obtained on 09/01/2016 identified a calcium of 8.6 with a  phosphorus of 2.9  Results of a chest x-ray obtained 08/31/2016 identified the following:  There remains mild patchy infiltrate in the lung bases, slightly less than on recent study. Lungs elsewhere are clear. Heart is upper normal in size with pulmonary vascularity within normal limits. No adenopathy. No bone lesions.  Results of a chest CT angio performed 13 June 2016 identified the following:  Cardiovascular: Satisfactory opacification of the pulmonary arteries to the segmental level. No evidence of pulmonary embolism. Non aneurysmal aorta. No dissection. Normal heart size. No pericardial effusion.  Mediastinum/Nodes: Mediastinal adenopathy is present. Precarinal lymph node measures 12 mm. Lymph nodes adjacent to the aortic arch measures 6 mm short axis. Trachea midline. Thyroid normal. Esophagus within normal limits. Small hilar nodes.  Lungs/Pleura: No pleural effusion or pneumothorax. Moderate bilateral ground-glass densities throughout all lobes of the lung.  Results of a head CT scan obtained 07/16/2014 identified the following:  Ventricles and sulci are appropriate for patient's age. No evidence for acute cortically based infarct intracranial hemorrhage, mass lesion or mass effect. Paranasal sinuses are unremarkable. Calvarium is intact. Orbits are unremarkable.  Spirometry was performed and demonstrated an FEV1 of 2.82 at 73 % of predicted.  The patient had an Asthma Control Test with the following results: ACT Total Score: 8.    Assessment and Plan:   1. Asthma, not well controlled, severe persistent, with acute exacerbation   2. Allergic rhinoconjunctivitis   3. Eosinophilia   4. Food allergy   5. Recurrent infections   6. Gastroesophageal reflux disease, esophagitis presence not specified   7. Tobacco smoker, less than 10 cigarettes per day   8. Hypocalcemia     1. Continue Symbicort 160 2 inhalations twice a day   2. Continue montelukast 10 mg one tablet once  a day  3. Continue omeprazole 40 mg once a day  4. Continue Ventolin HFA or similar if needed  5. Continue EpiPen if needed  6. Continue antihistamine if needed  7. Submit for Nucala administration   8. Bronchoscopy may help define eosinophilic pneumonia and possible hypersensitivity pneumonitis and other forms or inflammation but steroids may interfere with interpretation  9. Blood - ANCA w/reflex, PTH, Calcium, Phosphorus, IgA/G/M  10. Use nicotine substitutes to replace tobacco smoke exposure. Husband must smoke outdoors.  There are many etiologic agents that may be contributing to Kara [name] pulmonary scenario this spring. What is of concern is her steroid dependence to get her respiratory tract inflammation under control. Putting everything together I wonder if she does not have eosinophilic pneumonia especially given her peripheral eosinophil count at the time  of her hospitalization and the fact that she is so exquisitely sensitive and has a significant requirement for the use of systemic steroids. A bronchoscopy may be helpful during the acute phase of her presentation but with all her systemic steroid use and her slow taper prednisone I'm not sure that a bronchoscopy will help [place] much in identifying eosinophilic pneumonia at this point. She could have a form of undetected hypersensitivity pneumonitis that has eluded preliminary evaluation and a bronchoscopy may also help with that condition looking for CD8 positive T cells but her prednisone will once again skew the results of any bronchoscopy that is performed in the near future. Given her eosinophilia and her uncontrolled respiratory inflammation I think that she would benefit from nucala and we'll see if we can get that arranged sometime in the near future. I have ordered some blood tests to look for a vasculitic form of lower respiratory tract inflammation and she also appears to have persistent hypocalcemia for some reason that has been  in existence now for a while and I think that warrants checking for parathyroid function as noted above. Of course, she needs to stop smoking as not only is this hobby giving rise to irritant-induced respiratory tract inflammation but there are three diseases, namely respiratory bronchiolitis-associated ILD, desquamative interstitial pneumonia and pulmonary Langerhans cell histiocytosis, that are currently linked to smoking, and I have had a talk with her about the need to find a different hobby and replace her tobacco smoke exposure with some type of nicotine substitute during today's visit. I have not made an appointment for her to be seen in this clinic at this point and she will follow-up with pulmonology concerning further direction of her evaluation and therapy.  Kara Schimke, MD Allergy / Immunology [facility]

## 2016-09-07 NOTE — Patient Instructions (Addendum)
  1. Continue Symbicort 160 2 inhalations twice a day   2. Continue montelukast 10 mg one tablet once a day  3. Continue omeprazole 40 mg once a day  4. Continue Ventolin HFA or similar if needed  5. Continue EpiPen if needed  6. Continue antihistamine if needed  7. Submit for Nucala administration   8. Bronchoscopy may help define eosinophilic pneumonia and possible hypersensitivity pneumonitis and other forms or inflammation but steroids may interfere with interpretation  9. Blood - ANCA w/reflex, PTH, Calcium, Phosphorus, IgA/G/M  10. Use nicotine substitutes to replace tobacco smoke exposure. Husband must smoke outdoors.

## 2016-09-08 NOTE — Addendum Note (Signed)
Addended by: Kathrine HaddockWOOD, AMBER L on: 09/08/2016 03:28 PM   Modules accepted: Orders

## 2016-09-09 ENCOUNTER — Ambulatory Visit (INDEPENDENT_AMBULATORY_CARE_PROVIDER_SITE_OTHER)
Admission: RE | Admit: 2016-09-09 | Discharge: 2016-09-09 | Disposition: A | Discharge status: Home / Self Care | Payer: BC Managed Care – PPO | Source: Ambulatory Visit | Attending: Adult Health | Admitting: Adult Health

## 2016-09-09 ENCOUNTER — Encounter: Payer: Self-pay | Admitting: Adult Health

## 2016-09-09 ENCOUNTER — Ambulatory Visit (INDEPENDENT_AMBULATORY_CARE_PROVIDER_SITE_OTHER): Payer: BC Managed Care – PPO | Admitting: Adult Health

## 2016-09-09 VITALS — BP 114/80 | HR 106 | Ht 65.0 in | Wt 259.4 lb

## 2016-09-09 DIAGNOSIS — J189 Pneumonia, unspecified organism: Secondary | ICD-10-CM

## 2016-09-09 DIAGNOSIS — J4551 Severe persistent asthma with (acute) exacerbation: Secondary | ICD-10-CM | POA: Diagnosis not present

## 2016-09-09 NOTE — Assessment & Plan Note (Signed)
Recurrent asthma exacerbation, with frequent hospitalizations. Possible underlying a cephalic pneumonia as patient's eosinophils were very high. On this admission  Patient's continue with lab work through allergist. Patient will return here in 3 weeks for pulmonary function test and follow-up with pulmonary. Patient may need to undergo bronchoscopy. However, would be better to have off of all steroids fear that she will have a another exacerbation when steroids are stopped.   Plan  Patient Instructions  Continue on slow prednisone taper as directed. -once you reach 10mg  daily , remain on this dose until seen back in office  Continue on Symbicort 2 puffs Twice daily   follow up in 3 weeks with Dr. Isaiah SergeMannam with PFT .  Please contact office for sooner follow up if symptoms do not improve or worsen or seek emergency care

## 2016-09-09 NOTE — Patient Instructions (Addendum)
Continue on slow prednisone taper as directed. -once you reach 10mg  daily , remain on this dose until seen back in office  Continue on Symbicort 2 puffs Twice daily   follow up in 3 weeks with Dr. Isaiah SergeMannam with PFT .  Please contact office for sooner follow up if symptoms do not improve or worsen or seek emergency care  Smoking cessation

## 2016-09-09 NOTE — Progress Notes (Signed)
@Patient  ID: Kara [name], female    DOB: 1986-03-31, 31 y.o.   MRN: [id number]  Chief Complaint  Patient presents with  . Follow-up    PNA     Referring provider: [name], MD  HPI: 31 year old female, smoker followed for asthma   09/09/2016 post hospital follow-up. Patient returns for a post hospital follow-up. Patient was recently readmitted for severe asthma exacerbation. Healthcare associated pneumonia. Patient has had multiple hospitalizations over the last few months. Previous CT chest in February showed moderate diffuse bilateral groundglass densities. Chest x-ray on May 10 showed persistent bilateral densities. Patient was treated with IV antibiotics, steroids and nebulized bronchodilators. Patient did undergo a swallow evaluation that was negative.  Hypersensitivity pneumonitis panel was negative, IgE was elevated at 902. Aspergillus antibody was negative. ANA and rheumatoid factor were negative. Absolute eosinophil count was 1400. Patient was started on a prolonged prednisone taper at discharge, beginning at 40 mg daily for 1 week, decreasing by 10 mg weekly. He is currently on prednisone 30 mg daily. He says since discharge she is feeling much improved with decreased cough, shortness of breath and wheezing. At discharge on prednisone. Eosinophils returned to 0. Her main's on Symbicort twice daily. Patient has cut back on smoking. She is trying to use nicotine patches but is not working that great. Patient was seen by allergy Dr. Lucie Leather. This week with labs pending..   Chest x-ray today shows near resolution of bilateral infiltrates.    S currently on 30 een by Allergist this week . 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 Peanuts [Peanut Oil] Hives and Itching    Immunization History  Administered Date(s) Administered  . Influenza Split 02/02/2016  . Influenza-Unspecified 02/28/2015  . Pneumococcal Polysaccharide-23  04/09/2014    Past Medical History:  Diagnosis Date  . Alcohol abuse   . Asthma   . Environmental allergies   . GERD (gastroesophageal reflux disease)   . Headache   . Hypertension   . Pancreatitis   . Prolonged QT syndrome   . Syncope and collapse   . Vision abnormalities     Tobacco History: History  Smoking Status  . Current Every Day Smoker  . Packs/day: 1.00  . Years: 5.00  . Types: Cigarettes  . Last attempt to quit: 04/08/2012  Smokeless Tobacco  . Never Used    Comment: currently smoking 1/2ppd (09/09/16)   Ready to quit: Not Answered Counseling given: Not Answered   Outpatient Encounter Prescriptions as of 09/09/2016  Medication Sig  . albuterol (PROVENTIL HFA;VENTOLIN HFA) 108 (90 Base) MCG/ACT inhaler Inhale 2 puffs into the lungs every 6 (six) hours as needed for wheezing or shortness of breath.  Marland Kitchen albuterol (PROVENTIL) (2.5 MG/3ML) 0.083% nebulizer solution INHALE THE CONTENTS OF 1VIAL VIA NEBULIZER Q 4-6 H PRN  . budesonide-formoterol (SYMBICORT) 160-4.5 MCG/ACT inhaler Inhale 2 puffs into the lungs 2 (two) times daily.  . chlorhexidine (PERIDEX) 0.12 % solution Use as directed 15 mLs in the mouth or throat 2 (two) times daily. After meals  . Cholecalciferol (VITAMIN D PO) Take 1 tablet by mouth daily.   Marland Kitchen EPINEPHrine (EPIPEN 2-PAK) 0.3 mg/0.3 mL IJ SOAJ injection Inject 0.3 mg into the muscle once as needed (for severe allergic reaction).  . gabapentin (NEURONTIN) 300 MG capsule Take 900 mg by mouth 2 (two) times daily.  Marland Kitchen guaiFENesin (MUCINEX) 600 MG 12 hr tablet Take 2 tablets (1,200 mg total) by mouth 2 (  two) times daily.  Marland Kitchen ipratropium-albuterol (DUONEB) 0.5-2.5 (3) MG/3ML SOLN USE 1 VIAL PER NEBULIZER EVERY 6 HOURS  FOR 30 DAYS  . Melatonin 10 MG TABS Take 10 mg by mouth at bedtime.  . meloxicam (MOBIC) 15 MG tablet Take 1 tablet (15 mg total) by mouth daily as needed for pain.  . metoprolol succinate (TOPROL-XL) 25 MG 24 hr tablet Take 1 tablet (25 mg  total) by mouth daily. *Please call and schedule a one year follow up appointment*  . montelukast (SINGULAIR) 10 MG tablet TAKE 1 TABLET BY MOUTH DAILY (Patient taking differently: TAKE 1 TABLET BY MOUTH once daily at bedtime)  . Multiple Vitamins-Minerals (MULTIVITAMIN WITH MINERALS) tablet Take 1 tablet by mouth daily.  . naltrexone (DEPADE) 50 MG tablet Take 50 mg by mouth daily.  Marland Kitchen omeprazole (PRILOSEC) 40 MG capsule TAKE 1 CAPSULE(40 MG) BY MOUTH DAILY  . predniSONE (DELTASONE) 10 MG tablet Label  & dispense according to the schedule below.  4Pills PO for one week, then 3pills for one week, then 2pills for one week, then 1pill for one week. Further steroids taper per pulmonologist.  . QUEtiapine (SEROQUEL) 50 MG tablet Take 100 mg by mouth at bedtime.   . sertraline (ZOLOFT) 50 MG tablet Take 75 mg by mouth daily.  Marland Kitchen thiamine 100 MG tablet Take 100 mg by mouth daily.   No facility-administered encounter medications on file as of 09/09/2016.      Review of Systems  Constitutional:   No  weight loss, night sweats,  Fevers, chills, fatigue, or  lassitude.  HEENT:   No headaches,  Difficulty swallowing,  Tooth/dental problems, or  Sore throat,                No sneezing, itching, ear ache, + nasal congestion, post nasal drip,   CV:  No chest pain,  Orthopnea, PND, swelling in lower extremities, anasarca, dizziness, palpitations, syncope.   GI  No heartburn, indigestion, abdominal pain, nausea, vomiting, diarrhea, change in bowel habits, loss of appetite, bloody stools.   Resp:  .  No chest wall deformity  Skin: no rash or lesions.  GU: no dysuria, change in color of urine, no urgency or frequency.  No flank pain, no hematuria   MS:  No joint pain or swelling.  No decreased range of motion.  No back pain.    Physical Exam  BP 114/80 (BP Location: Left Arm, Cuff Size: Normal)   Pulse (!) 106   Ht 5\' 5"  (1.651 m)   Wt 259 lb 6.4 oz (117.7 kg)   LMP 08/31/2016   SpO2 95%    BMI 43.17 kg/m   GEN: A/Ox3; pleasant , NAD, obese    HEENT:  [place]/AT,  EACs-clear, TMs-wnl, NOSE-clear, THROAT-clear, no lesions, no postnasal drip or exudate noted.   NECK:  Supple w/ fair ROM; no JVD; normal carotid impulses w/o bruits; no thyromegaly or nodules palpated; no lymphadenopathy.    RESP  Clear  P & A; w/o, wheezes/ rales/ or rhonchi. no accessory muscle use, no dullness to percussion  CARD:  RRR, no m/r/g, no peripheral edema, pulses intact, no cyanosis or clubbing.  GI:   Soft & nt; nml bowel sounds; no organomegaly or masses detected.   Musco: Warm bil, no deformities or joint swelling noted.   Neuro: alert, no focal deficits noted.    Skin: Warm, no lesions or rashes    Lab Results:  CBC    Component Value Date/Time  WBC 12.2 (H) 09/01/2016 0547   RBC 4.64 09/01/2016 0547   HGB 12.1 09/01/2016 0547   HCT 39.1 09/01/2016 0547   PLT 227 09/01/2016 0547   MCV 84.3 09/01/2016 0547   MCH 26.1 09/01/2016 0547   MCHC 30.9 09/01/2016 0547   RDW 15.0 09/01/2016 0547   LYMPHSABS 2.2 09/01/2016 0547   MONOABS 1.0 09/01/2016 0547   EOSABS 0.0 09/01/2016 0547   BASOSABS 0.0 09/01/2016 0547    BMET    Component Value Date/Time   NA 138 09/01/2016 0547   NA 142 09/02/2014 1507   K 4.0 09/01/2016 0547   CL 103 09/01/2016 0547   CO2 27 09/01/2016 0547   GLUCOSE 139 (H) 09/01/2016 0547   BUN 27 (H) 09/01/2016 0547   BUN 7 09/02/2014 1507   CREATININE 0.71 09/01/2016 0547   CALCIUM 8.6 (L) 09/01/2016 0547   GFRNONAA >60 09/01/2016 0547   GFRAA >60 09/01/2016 0547    BNP    Component Value Date/Time   BNP 21.7 06/12/2016 2136    ProBNP No results found for: PROBNP  Imaging: Dg Chest 2 View  Result Date: 09/09/2016 CLINICAL DATA:  Cough and congestion.  Follow-up pneumonia. EXAM: CHEST  2 VIEW COMPARISON:  08/31/2016 and prior radiograph FINDINGS: Cardiomegaly and peribronchial thickening again noted. Near complete resolution of bilateral lower  lung opacities noted. There is no evidence of pleural effusion or pneumothorax. No other changes identified. IMPRESSION: Near complete resolution of bilateral lower lung opacities/pneumonia. Cardiomegaly and peribronchial thickening again noted. Electronically Signed   By: Harmon Pier M.D.   On: 09/09/2016 15:58   Dg Chest 2 View  Result Date: 08/31/2016 CLINICAL DATA:  Recent pneumonia.  Hypertension. EXAM: CHEST  2 VIEW COMPARISON:  Aug 26, 2016 FINDINGS: There remains mild patchy infiltrate in the lung bases, slightly less than on recent study. Lungs elsewhere are clear. Heart is upper normal in size with pulmonary vascularity within normal limits. No adenopathy. No bone lesions. IMPRESSION: Partial clearing of patchy infiltrate from the bases bilaterally. A small amount of residual opacity remains in the bases. Lungs elsewhere clear. Stable cardiac silhouette. Electronically Signed   By: Bretta Bang III M.D.   On: 08/31/2016 08:25   Dg Chest 2 View  Result Date: 08/26/2016 CLINICAL DATA:  Shortness of breath.  Productive cough. EXAM: CHEST  2 VIEW COMPARISON:  Most recent radiographs 08/01/2016, chest CT 06/13/2016 FINDINGS: Unchanged heart size and mediastinal contours. There are persistent bilateral heterogeneous airspace opacities in a lower lung zone predominant distribution. Assess slightly progressed at the right lung base. Possible trace right pleural effusion. No pneumothorax. No acute osseous abnormalities. IMPRESSION: Persistent bilateral heterogeneous airspace opacities, progressed at the right lung base. In conjunction with prior chest CT, findings can be seen in the setting of pulmonary hemorrhage or interstitial lung disease. Atypical infection could have a similar appearance. Electronically Signed   By: Rubye Oaks M.D.   On: 08/26/2016 20:42   Dg Swallowing Func-speech Pathology  Result Date: 08/28/2016 Objective Swallowing Evaluation: Type of Study: MBS-Modified Barium  Swallow Study Patient Details Name: Catlin Doria MRN: [id number] Date of Birth: [birth year] Today's Date: 08/28/2016 Time: SLP Start Time (ACUTE ONLY): 1455-SLP Stop Time (ACUTE ONLY): 1505 SLP Time Calculation (min) (ACUTE ONLY): 10 min Past Medical History: Past Medical History: Diagnosis Date . Alcohol abuse  . Asthma  . Environmental allergies  . GERD (gastroesophageal reflux disease)  . Headache  . Hypertension  . Pancreatitis  . Prolonged QT syndrome  .  Syncope and collapse  . Vision abnormalities  Past Surgical History: Past Surgical History: Procedure Laterality Date . ESOPHAGOGASTRODUODENOSCOPY N/A 07/17/2014  Procedure: ESOPHAGOGASTRODUODENOSCOPY (EGD);  Surgeon: Dorena CookeyJohn Hayes, MD;  [facility];  Service: Endoscopy;  Laterality: N/A; . TYMPANOSTOMY TUBE PLACEMENT   HPI: Patient is a 31 y.o. female with PMH: bronchial asthma, ongoing tobacco abuse, alcohol abuse, GERD, pancreatitis, HTN, headache, syncope and collapse, environmental allergies. She has been admitted two other times in the past two months for SOB and possible PNA. CXR revealed bilateral infiltrates, worsening on the right side. MD orderded MBS to r/o aspiration as the cause of patient's recurrent PNA's. Subjective: pleasant, cooperative, no complaints Assessment / Plan / Recommendation CHL IP CLINICAL IMPRESSIONS 08/28/2016 Clinical Impression Patient presents with an oropharyngeal swallow that is largely [facility]. She exhibited one instance of flash penetration with large volume sip of thin liquids from cup, but did not exhibit any other incidence of penetration or aspiration. Patient with timely swallow initiation, full clearance of all tested boluses (puree, hard solid, pill, thin liquids) with no residuals remaining in laryngeal vestibule post swallow. As per esophageal sweep, pill transited upper esophagus without difficulty. Based on this study, SLP's clinician judgement is that it would be very unlikely that patient's PNA's were caused by  aspiration from foods and liquids she eats. Although no evidence of retrograde movement of liquids or solids was observed during this study, cannot r/o possibility of aspiration from reflux, but this seems unlikely. SLP Visit Diagnosis Dysphagia, pharyngeal phase (R13.13) Attention and concentration deficit following -- Frontal lobe and executive function deficit following -- Impact on safety and function No limitations   CHL IP TREATMENT RECOMMENDATION 08/28/2016 Treatment Recommendations No treatment recommended at this time   No flowsheet data found. CHL IP DIET RECOMMENDATION 08/28/2016 SLP Diet Recommendations Regular solids;Thin liquid Liquid Administration via Cup;Straw Medication Administration Whole meds with liquid Compensations -- Postural Changes Seated upright at 90 degrees   No flowsheet data found.  CHL IP FOLLOW UP RECOMMENDATIONS 08/28/2016 Follow up Recommendations None   No flowsheet data found.     CHL IP ORAL PHASE 08/28/2016 Oral Phase WFL Oral - Pudding Teaspoon -- Oral - Pudding Cup -- Oral - Honey Teaspoon -- Oral - Honey Cup -- Oral - Nectar Teaspoon -- Oral - Nectar Cup -- Oral - Nectar Straw -- Oral - Thin Teaspoon -- Oral - Thin Cup -- Oral - Thin Straw -- Oral - Puree -- Oral - Mech Soft -- Oral - Regular -- Oral - Multi-Consistency -- Oral - Pill -- Oral Phase - Comment --  CHL IP PHARYNGEAL PHASE 08/28/2016 Pharyngeal Phase Impaired Pharyngeal- Pudding Teaspoon -- Pharyngeal -- Pharyngeal- Pudding Cup -- Pharyngeal -- Pharyngeal- Honey Teaspoon -- Pharyngeal -- Pharyngeal- Honey Cup -- Pharyngeal -- Pharyngeal- Nectar Teaspoon -- Pharyngeal -- Pharyngeal- Nectar Cup -- Pharyngeal -- Pharyngeal- Nectar Straw -- Pharyngeal -- Pharyngeal- Thin Teaspoon -- Pharyngeal -- Pharyngeal- Thin Cup Reduced airway/laryngeal closure Pharyngeal Material enters airway, remains ABOVE vocal cords then ejected out Pharyngeal- Thin Straw -- Pharyngeal -- Pharyngeal- Puree WFL Pharyngeal -- Pharyngeal-  Mechanical Soft NT Pharyngeal -- Pharyngeal- Regular WFL Pharyngeal -- Pharyngeal- Multi-consistency NT Pharyngeal -- Pharyngeal- Pill WFL Pharyngeal -- Pharyngeal Comment Only one instance of flash penetration with large sip of thin liquids  CHL IP CERVICAL ESOPHAGEAL PHASE 08/28/2016 Cervical Esophageal Phase WFL Pudding Teaspoon -- Pudding Cup -- Honey Teaspoon -- Honey Cup -- Nectar Teaspoon -- Nectar Cup -- Nectar Straw -- Thin Teaspoon --  Thin Cup -- Thin Straw -- Puree -- Mechanical Soft -- Regular -- Multi-consistency -- Pill -- Cervical Esophageal Comment -- Angela Nevin, MA, CCC-SLP 08/28/16 3:33 PM                Assessment & Plan:   No problem-specific Assessment & Plan notes found for this encounter.     Rubye Oaks, NP 09/09/2016

## 2016-09-15 ENCOUNTER — Telehealth: Payer: Self-pay

## 2016-09-15 ENCOUNTER — Telehealth: Payer: Self-pay | Admitting: Pulmonary Disease

## 2016-09-15 NOTE — Telephone Encounter (Signed)
Diflucan 200mg  once a day for 10 days. Dr[facility]   [facility] [place] yesterday. She stated that she was told she has a fungal infection in her lungs. Dr. Tenny Crawoss sent in a RX for Diflucan 200mg , once a day for 10 days.  She wanted to make sure that this medication would be ok.   PM, please advise. Thanks!

## 2016-09-15 NOTE — Telephone Encounter (Signed)
Spoke with the pt and notified of recs per PM and she verbalized understanding

## 2016-09-15 NOTE — Telephone Encounter (Signed)
lmtcb x1 for pt, to schedule OV on 09/29/16 @ 1:45 per PM. TP requested pt to be seen within 3 weeks. 1:45 slot will need to be opened if this time/day works for pt. Please let me know so I can consult with Tammy and Mellody DanceKeith about opening slot. Thanks.

## 2016-09-15 NOTE — Telephone Encounter (Signed)
Its fine to take it.  PM

## 2016-09-17 LAB — FUNGAL ORGANISM REFLEX

## 2016-09-17 LAB — FUNGUS CULTURE RESULT

## 2016-09-17 LAB — FUNGUS CULTURE WITH STAIN

## 2016-09-21 ENCOUNTER — Other Ambulatory Visit: Payer: Self-pay | Admitting: Pulmonary Disease

## 2016-09-21 DIAGNOSIS — R0602 Shortness of breath: Secondary | ICD-10-CM

## 2016-09-21 NOTE — Telephone Encounter (Signed)
Pt has been scheduled for PFT and OV with PM on 09/22/16. Nothing further needed.

## 2016-09-22 ENCOUNTER — Ambulatory Visit (INDEPENDENT_AMBULATORY_CARE_PROVIDER_SITE_OTHER): Payer: BC Managed Care – PPO | Admitting: Pulmonary Disease

## 2016-09-22 ENCOUNTER — Encounter: Payer: Self-pay | Admitting: Pulmonary Disease

## 2016-09-22 VITALS — BP 124/68 | HR 109 | Ht 64.0 in | Wt 260.8 lb

## 2016-09-22 DIAGNOSIS — R0602 Shortness of breath: Secondary | ICD-10-CM

## 2016-09-22 DIAGNOSIS — J45901 Unspecified asthma with (acute) exacerbation: Secondary | ICD-10-CM | POA: Diagnosis not present

## 2016-09-22 DIAGNOSIS — J849 Interstitial pulmonary disease, unspecified: Secondary | ICD-10-CM | POA: Diagnosis not present

## 2016-09-22 DIAGNOSIS — J4551 Severe persistent asthma with (acute) exacerbation: Secondary | ICD-10-CM

## 2016-09-22 LAB — IGG, IGA, IGM
IgG (Immunoglobin G), Serum: 516 mg/dL — ABNORMAL LOW (ref 700–1600)
IgM (Immunoglobulin M), Srm: 63 mg/dL (ref 26–217)
Immunoglobulin A, (IgA) QN, Serum: 388 mg/dL — ABNORMAL HIGH (ref 87–352)

## 2016-09-22 LAB — PAN-ANCA
C-ANCA: <1:20 {titer}
Myeloperoxidase Ab: <9 U/mL (ref 0.0–9.0)
P-ANCA: <1:20 {titer}

## 2016-09-22 LAB — PULMONARY FUNCTION TEST
DL/VA % PRED: 103 %
DL/VA: 5.07 ml/min/mmHg/L
DLCO cor % pred: 81 %
DLCO cor: 20.98 ml/min/mmHg
DLCO unc % pred: 87 %
DLCO unc: 22.43 ml/min/mmHg
FEF 25-75 Post: 2.84 L/sec
FEF 25-75 Pre: 1.67 L/sec
FEF2575-%CHANGE-POST: 69 %
FEF2575-%Pred-Post: 82 %
FEF2575-%Pred-Pre: 48 %
FEV1-%CHANGE-POST: 11 %
FEV1-%PRED-PRE: 64 %
FEV1-%Pred-Post: 72 %
FEV1-PRE: 2.11 L
FEV1-Post: 2.36 L
FEV1FVC-%CHANGE-POST: 6 %
FEV1FVC-%Pred-Pre: 91 %
FEV6-%Change-Post: 3 %
FEV6-%PRED-POST: 74 %
FEV6-%Pred-Pre: 71 %
FEV6-Post: 2.86 L
FEV6-Pre: 2.75 L
FEV6FVC-%PRED-PRE: 101 %
FEV6FVC-%Pred-Post: 101 %
FVC-%Change-Post: 4 %
FVC-%PRED-POST: 74 %
FVC-%PRED-PRE: 70 %
FVC-POST: 2.89 L
FVC-Pre: 2.75 L
POST FEV1/FVC RATIO: 82 %
PRE FEV6/FVC RATIO: 100 %
Post FEV6/FVC ratio: 100 %
Pre FEV1/FVC ratio: 77 %
RV % PRED: 93 %
RV: 1.38 L
TLC % pred: 84 %
TLC: 4.38 L

## 2016-09-22 LAB — PTH, INTACT AND CALCIUM
Calcium: 9.3 mg/dL (ref 8.7–10.2)
PTH: 40 pg/mL (ref 15–65)

## 2016-09-22 LAB — CALCIUM, IONIZED: Calcium, Ion: 5.3 mg/dL (ref 4.5–5.6)

## 2016-09-22 LAB — PHOSPHORUS: PHOSPHORUS: 3.4 mg/dL (ref 2.5–4.5)

## 2016-09-22 MED ORDER — NICOTINE 21 MG/24HR TD PT24: 21.0000 mg | MEDICATED_PATCH | TRANSDERMAL | 0 refills | Status: AC

## 2016-09-22 NOTE — Progress Notes (Signed)
Kara [name]    [id number]    [birth year]  Primary Care Physician:Ross, Dwyane Luo, MD  Referring Physician: Lawerance Cruel, MD [place], [place] [address]  Chief complaint:  Consult for evaluation of asthma  HPI: Kara [name] is a 31 year old with medical history of asthma, alcohol abuse, pancreatitis. She was diagnosed with asthma about 15 years ago. She follows with Dr. Neldon Mc and is maintained on symbicort, montelukast but has not had a visit there since January 2017. She has been tested for allergies before and reports sensitivity to cats, dogs, pollen, mold and multiple other allergens. She has seasonal allergies, postnasal drip and acid reflux.  She has been hospitalized at [facility] in February and April 2018 for recurrent pneumonia treated with antibiotics, steroids, nebulizers. All cultures were negative. She was negative for flu and viral infections on the RVP. She has been discharged on 4/16 and still is in a prednisone taper. She reports that her breathing is slowly getting back to normal. She still has some dyspnea with activity associated with occasional wheeze. Cough with clear mucus production. She denies any fevers, chills.  Interim History:  Readmitted in May 2018 with recurrent respiratory failure. Noted to have elevated eosinophils on admission. She was treated with antibiotics and discharged on a prolonged taper. She is still on 10 mg of prednisone which will and one week from now. She has been evaluated at the [facility] for [facility]. 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 Exposures: No known exposures at work or at home, no mold issues Smoking history: Smokes a pack a day for past 5 years. Last alcohol use was in December 2017.  Outpatient Encounter Prescriptions as of 09/22/2016  Medication Sig  . albuterol (PROVENTIL  HFA;VENTOLIN HFA) 108 (90 Base) MCG/ACT inhaler Inhale 2 puffs into the lungs every 6 (six) hours as needed for wheezing or shortness of breath.  Marland Kitchen albuterol (PROVENTIL) (2.5 MG/3ML) 0.083% nebulizer solution INHALE THE CONTENTS OF 1VIAL VIA NEBULIZER Q 4-6 H PRN  . budesonide-formoterol (SYMBICORT) 160-4.5 MCG/ACT inhaler Inhale 2 puffs into the lungs 2 (two) times daily.  . chlorhexidine (PERIDEX) 0.12 % solution Use as directed 15 mLs in the mouth or throat 2 (two) times daily. After meals  . Cholecalciferol (VITAMIN D PO) Take 1 tablet by mouth daily.   Marland Kitchen EPINEPHrine (EPIPEN 2-PAK) 0.3 mg/0.3 mL IJ SOAJ injection Inject 0.3 mg into the muscle once as needed (for severe allergic reaction).  . gabapentin (NEURONTIN) 300 MG capsule Take 900 mg by mouth 2 (two) times daily.  Marland Kitchen guaiFENesin (MUCINEX) 600 MG 12 hr tablet Take 2 tablets (1,200 mg total) by mouth 2 (two) times daily.  Marland Kitchen ipratropium-albuterol (DUONEB) 0.5-2.5 (3) MG/3ML SOLN USE 1 VIAL PER NEBULIZER EVERY 6 HOURS  FOR 30 DAYS  . Melatonin 10 MG TABS Take 10 mg by mouth at bedtime.  . meloxicam (MOBIC) 15 MG tablet Take 1 tablet (15 mg total) by mouth daily as needed for pain.  . metoprolol succinate (TOPROL-XL) 25 MG 24 hr tablet Take 1 tablet (25 mg total) by mouth daily. *Please call and schedule a one year follow up appointment*  . montelukast (SINGULAIR) 10 MG tablet TAKE 1 TABLET BY MOUTH DAILY (Patient taking differently: TAKE 1 TABLET BY MOUTH once daily at bedtime)  . Multiple Vitamins-Minerals (  MULTIVITAMIN WITH MINERALS) tablet Take 1 tablet by mouth daily.  . naltrexone (DEPADE) 50 MG tablet Take 50 mg by mouth daily.  Marland Kitchen omeprazole (PRILOSEC) 40 MG capsule TAKE 1 CAPSULE(40 MG) BY MOUTH DAILY  . predniSONE (DELTASONE) 10 MG tablet Label  & dispense according to the schedule below.  4Pills PO for one week, then 3pills for one week, then 2pills for one week, then 1pill for one week. Further steroids taper per pulmonologist.  .  QUEtiapine (SEROQUEL) 50 MG tablet Take 100 mg by mouth at bedtime.   . sertraline (ZOLOFT) 50 MG tablet Take 75 mg by mouth daily.  Marland Kitchen thiamine 100 MG tablet Take 100 mg by mouth daily.   No facility-administered encounter medications on file as of 09/22/2016.     Allergies as of 09/22/2016 - Review Complete 09/22/2016  Allergen Reaction Noted  . Peanuts [peanut oil] Hives and Itching 07/06/2014    Past Medical History:  Diagnosis Date  . Alcohol abuse   . Asthma   . Environmental allergies   . GERD (gastroesophageal reflux disease)   . Headache   . Hypertension   . Pancreatitis   . Prolonged QT syndrome   . Syncope and collapse   . Vision abnormalities     Past Surgical History:  Procedure Laterality Date  . ESOPHAGOGASTRODUODENOSCOPY N/A 07/17/2014   Procedure: ESOPHAGOGASTRODUODENOSCOPY (EGD);  Surgeon: Teena Irani, MD;  Location: [facility];  Service: Endoscopy;  Laterality: N/A;  . TYMPANOSTOMY TUBE PLACEMENT      Family History  Problem Relation Age of Onset  . Heart failure Mother   . Heart failure Father   . Diabetes Father   . Heart failure Maternal Grandfather     Social History   Social History  . Marital status: Married    Spouse name: Winferd Humphrey  . Number of children: 0  . Years of education: N/A   Occupational History  . College advisor    Social History Main Topics  . Smoking status: Current Every Day Smoker    Packs/day: 1.00    Years: 5.00    Types: Cigarettes    Last attempt to quit: 04/08/2012  . Smokeless tobacco: Never Used     Comment: currently smoking 1/2ppd (09/09/16)  . Alcohol use No  . Drug use: No  . Sexual activity: Yes    Birth control/ protection: None   Other Topics Concern  . Not on file   Social History Narrative   Married.    Review of systems: Review of Systems  Constitutional: Negative for fever and chills.  HENT: Negative.   Eyes: Negative for blurred vision.  Respiratory: as per HPI  Cardiovascular:  Negative for chest pain and palpitations.  Gastrointestinal: Negative for vomiting, diarrhea, blood per rectum. Genitourinary: Negative for dysuria, urgency, frequency and hematuria.  Musculoskeletal: Negative for myalgias, back pain and joint pain.  Skin: Negative for itching and rash.  Neurological: Negative for dizziness, tremors, focal weakness, seizures and loss of consciousness.  Endo/Heme/Allergies: Negative for environmental allergies.  Psychiatric/Behavioral: Negative for depression, suicidal ideas and hallucinations.  All other systems reviewed and are negative.  Physical Exam: Blood pressure 132/88, pulse 86, height _0  (1.651 m), weight 244 lb 12.8 oz (111 kg), last menstrual period 07/09/2016, SpO2 96 %. Gen:      No acute distress HEENT:  EOMI, sclera anicteric Neck:     No masses; no thyromegaly Lungs:    Clear to auscultation bilaterally; normal respiratory effort CV:  Regular rate and rhythm; no murmurs Abd:      + bowel sounds; soft, non-tender; no palpable masses, no distension Ext:    No edema; adequate peripheral perfusion Skin:      Warm and dry; no rash Neuro: alert and oriented x 3 Psych: normal mood and affect  Data Reviewed: CT scan- 06/13/16- diffuse interstitial groundglass opacities, mild mediastinal adenopathy CXR 08/01/16- bilateral interstitial opacities  Arlyce Harman 05/13/15 FVC 3.02 (85%] FEV1 2.56 (84%) F/F 84 No obstruction  FENO 08/06/16-13  Serologies ANA, ANCA, RA negative HSP panel 08/27/16- negative  Sp cx 5/15- Candida AFB, regular cultures are negative  IgE 08/27/16- 902  PLT 09/22/16 FVC 2.89 (74%) FEV1 2.36 (72%) F/F 82 TLC 84% DLCO 87% Minimal obstructive, small airways disease  Assessment:  Moderate persistent asthma She has had multiple admissions for respiratory failure with infiltrates. Noted to have elevated eosinophila at last admission She appears to be very sensitive to prednisone but is unable to get off  it. Workup for interstitial lung disease, hypersensitivity panel is negative. Sputum culture is growing Candida which is probably contaminant. She has already received 7 days of Diflucan for this.  Differential diagnosis are uncontrolled asthma, eosinophilic pneumonias, Churg-Strauss syndrome (ANCA is negative), hypersensitivity pneumonia. Other considerations include smoking related ILD such as RB ILD although we would not expect to have high eosinophils with them.  The plan for a HRCT and a bronchoscopy with BAL this month. Hopefully she'll be completely off prednisone by then by then. In the meantime she'll continue on Singulair, Symbicort. She may benefit from anti IL5 therapy if the bronch is unrevealing. Risk benefit of the procedure were discussed with the patient and she agreed to proceed.   Active smoker Reiterated the importance of quitting smoking completely. We'll prescribe nicotine patches to help with this. Time spent counseling 5 mins.   Plan/Recommendations: - Continue Symbicort, Ventolin - Schedule for HRCT followed by bronchoscopy. - Smoking cessation. Nicotine patches.   Return to clinic in 1-2 months.  Marshell Garfinkel MD [facility] Pager [phone] 09/22/2016, 11:48 AM  CC: Lawerance Cruel, MD

## 2016-09-22 NOTE — Progress Notes (Signed)
PFT done today. 

## 2016-09-29 ENCOUNTER — Other Ambulatory Visit: Payer: Self-pay

## 2016-09-29 ENCOUNTER — Other Ambulatory Visit: Payer: Self-pay | Admitting: Allergy and Immunology

## 2016-09-29 MED ORDER — BUDESONIDE-FORMOTEROL FUMARATE 160-4.5 MCG/ACT IN AERO: 2.0000 | INHALATION_SPRAY | Freq: Two times a day (BID) | RESPIRATORY_TRACT | 1 refills | Status: DC

## 2016-09-29 MED ORDER — ALBUTEROL SULFATE HFA 108 (90 BASE) MCG/ACT IN AERS: 2.0000 | INHALATION_SPRAY | RESPIRATORY_TRACT | 1 refills | Status: DC | PRN

## 2016-09-29 MED ORDER — ALBUTEROL SULFATE HFA 108 (90 BASE) MCG/ACT IN AERS: 2.0000 | INHALATION_SPRAY | Freq: Four times a day (QID) | RESPIRATORY_TRACT | 1 refills | Status: DC | PRN

## 2016-09-29 MED ORDER — MONTELUKAST SODIUM 10 MG PO TABS: 10.0000 mg | ORAL_TABLET | Freq: Every day | ORAL | 1 refills | Status: DC

## 2016-09-29 NOTE — Telephone Encounter (Signed)
patient needs a refill on her symbicort, singulair, proair, albuterol called into [facility] [address]

## 2016-09-29 NOTE — Telephone Encounter (Signed)
Pro-air sent to pharmacy for insurance coverage.  

## 2016-09-29 NOTE — Telephone Encounter (Signed)
Called patient and informed her that I sent in refills.

## 2016-09-29 NOTE — Addendum Note (Signed)
Addended by: Marthann SchillerLIPFORD, JENNIFER C on: 09/29/2016 11:37 AM   Modules accepted: Orders

## 2016-09-29 NOTE — Telephone Encounter (Signed)
Patient called back and stated her insurance does not cover Proventil. I sent in Proair. I called [facility] to tell them to disregard the refill for Proventil.

## 2016-10-06 ENCOUNTER — Ambulatory Visit (INDEPENDENT_AMBULATORY_CARE_PROVIDER_SITE_OTHER)
Admission: RE | Admit: 2016-10-06 | Discharge: 2016-10-06 | Disposition: A | Discharge status: Home / Self Care | Payer: BC Managed Care – PPO | Source: Ambulatory Visit | Attending: Pulmonary Disease | Admitting: Pulmonary Disease

## 2016-10-06 DIAGNOSIS — J849 Interstitial pulmonary disease, unspecified: Secondary | ICD-10-CM

## 2016-10-07 ENCOUNTER — Telehealth: Payer: Self-pay | Admitting: Pulmonary Disease

## 2016-10-07 NOTE — Telephone Encounter (Signed)
Spoke with patient. She stated that was returning Dr. Shirlee MoreMannam's call from yesterday.   Will route to PM to follow up.

## 2016-10-08 ENCOUNTER — Ambulatory Visit (HOSPITAL_COMMUNITY): Payer: BC Managed Care – PPO

## 2016-10-08 ENCOUNTER — Encounter (HOSPITAL_COMMUNITY)
Admission: RE | Disposition: A | Discharge status: Home / Self Care | Payer: Self-pay | Source: Ambulatory Visit | Attending: Pulmonary Disease

## 2016-10-08 ENCOUNTER — Ambulatory Visit (HOSPITAL_COMMUNITY)
Admission: RE | Admit: 2016-10-08 | Discharge: 2016-10-08 | Disposition: A | Discharge status: Home / Self Care | Payer: BC Managed Care – PPO | Source: Ambulatory Visit | Attending: Pulmonary Disease | Admitting: Pulmonary Disease

## 2016-10-08 ENCOUNTER — Telehealth: Payer: Self-pay | Admitting: Pulmonary Disease

## 2016-10-08 DIAGNOSIS — J82 Pulmonary eosinophilia, not elsewhere classified: Secondary | ICD-10-CM | POA: Diagnosis not present

## 2016-10-08 DIAGNOSIS — J9610 Chronic respiratory failure, unspecified whether with hypoxia or hypercapnia: Secondary | ICD-10-CM | POA: Insufficient documentation

## 2016-10-08 DIAGNOSIS — Z79899 Other long term (current) drug therapy: Secondary | ICD-10-CM | POA: Insufficient documentation

## 2016-10-08 DIAGNOSIS — R918 Other nonspecific abnormal finding of lung field: Secondary | ICD-10-CM | POA: Diagnosis not present

## 2016-10-08 DIAGNOSIS — Z9889 Other specified postprocedural states: Secondary | ICD-10-CM

## 2016-10-08 DIAGNOSIS — Z7951 Long term (current) use of inhaled steroids: Secondary | ICD-10-CM | POA: Insufficient documentation

## 2016-10-08 HISTORY — PX: VIDEO BRONCHOSCOPY: SHX5072

## 2016-10-08 LAB — BODY FLUID CELL COUNT WITH DIFFERENTIAL
EOS FL: 20 %
Lymphs, Fluid: 22 %
MONOCYTE-MACROPHAGE-SEROUS FLUID: 27 % — AB (ref 50–90)
Neutrophil Count, Fluid: 31 % — ABNORMAL HIGH (ref 0–25)
Total Nucleated Cell Count, Fluid: 485 cu mm (ref 0–1000)

## 2016-10-08 SURGERY — BRONCHOSCOPY, WITH FLUOROSCOPY
Anesthesia: Moderate Sedation | Laterality: Bilateral

## 2016-10-08 MED ORDER — AZITHROMYCIN 250 MG PO TABS: ORAL_TABLET | ORAL | 0 refills | Status: DC

## 2016-10-08 MED ORDER — SODIUM CHLORIDE 0.9 % IV SOLN
INTRAVENOUS | Status: DC
  Administered 2016-10-08: 10:00:00 via INTRAVENOUS

## 2016-10-08 MED ORDER — PHENYLEPHRINE HCL 0.25 % NA SOLN
NASAL | Status: DC | PRN
  Administered 2016-10-08: 2 via NASAL

## 2016-10-08 MED ORDER — FENTANYL CITRATE (PF) 100 MCG/2ML IJ SOLN
INTRAMUSCULAR | Status: DC | PRN
  Administered 2016-10-08 (×8): 25 ug via INTRAVENOUS

## 2016-10-08 MED ORDER — PHENYLEPHRINE HCL 0.25 % NA SOLN: 1.0000 | Freq: Four times a day (QID) | NASAL | Status: DC | PRN

## 2016-10-08 MED ORDER — MIDAZOLAM HCL 10 MG/2ML IJ SOLN
INTRAMUSCULAR | Status: DC | PRN
  Administered 2016-10-08 (×8): 1 mg via INTRAVENOUS

## 2016-10-08 MED ORDER — MIDAZOLAM HCL 5 MG/ML IJ SOLN
INTRAMUSCULAR | Status: AC
  Filled 2016-10-08: qty 2

## 2016-10-08 MED ORDER — FENTANYL CITRATE (PF) 100 MCG/2ML IJ SOLN
INTRAMUSCULAR | Status: AC
  Filled 2016-10-08: qty 4

## 2016-10-08 MED ORDER — LIDOCAINE HCL 1 % IJ SOLN
INTRAMUSCULAR | Status: DC | PRN
  Administered 2016-10-08: 6 mL via RESPIRATORY_TRACT

## 2016-10-08 MED ORDER — LIDOCAINE HCL 2 % EX GEL: 1.0000 "application " | Freq: Once | CUTANEOUS | Status: DC

## 2016-10-08 MED ORDER — LIDOCAINE HCL 2 % EX GEL
CUTANEOUS | Status: DC | PRN
  Administered 2016-10-08: 1

## 2016-10-08 MED ORDER — BUTAMBEN-TETRACAINE-BENZOCAINE 2-2-14 % EX AERO: 1.0000 | INHALATION_SPRAY | Freq: Once | CUTANEOUS | Status: DC

## 2016-10-08 NOTE — Progress Notes (Signed)
Video Bronchoscopy done  Intervention Bronchial washing done intervention Bronchial  Biopsy Procedure tolerated well

## 2016-10-08 NOTE — Op Note (Signed)
[facility] Cardiopulmonary Patient Name: Kara [name] Pocedure Date: 10/08/2016 MRN: [id number] Attending MD: Marshell Garfinkel , MD Date of Birth: [birth year] CSN: Finalized Age: 31 Admit Type: Outpatient Gender: Female Procedure:            Bronchoscopy Indications:          Bilateral recurring infiltrates Providers:            Marshell Garfinkel, MD, Andre Lefort RRT,RCP, Cherre Huger RRT, RCP Referring MD:          Medicines:            Fentanyl 200 mcg IV, Midazolam 8 mg IV Complications:        No immediate complications Estimated Blood Loss: Estimated blood loss: none. Procedure:            Pre-Anesthesia Assessment:                       - A History and Physical has been performed. Patient                        meds and allergies have been reviewed. The risks and                        benefits of the procedure and the sedation options and                        risks were discussed with the patient. All questions                        were answered and informed consent was obtained.                        Patient identification and proposed procedure were                        verified prior to the procedure by the physician in the                        procedure room. Mental Status Examination: alert and                        oriented. Airway Examination: normal oropharyngeal                        airway. Respiratory Examination: clear to auscultation.                        CV Examination: RRR, no murmurs, no S3 or S4. ASA Grade                        Assessment: II - A patient with mild systemic disease.                        After reviewing the risks and benefits, the patient was                        deemed in satisfactory condition to undergo the  procedure. The anesthesia plan was to use moderate                        sedation / analgesia (conscious sedation). Immediately                        prior to  administration of medications, the patient was                        re-assessed for adequacy to receive sedatives. The                        heart rate, respiratory rate, oxygen saturations, blood                        pressure, adequacy of pulmonary ventilation, and                        response to care were monitored throughout the                        procedure. The physical status of the patient was                        re-assessed after the procedure.                       After obtaining informed consent, the bronchoscope was                        passed under direct vision. Throughout the procedure,                        the patient's blood pressure, pulse, and oxygen                        saturations were monitored continuously. the [id number]                        [id number] scope was introduced through the left nostril                        and advanced to the tracheobronchial tree of both lungs. Scope In: 10:20:47 AM Scope Out: 10:34:29 AM Findings:      Mild purulent seceretion were seen mostly in left lung. The remainder of       the tracheobronchial tree is normal.      Bronchoalveolar lavage was performed in the LUL anterior segment (B3) of       the lung and sent for cell count, bacterial culture, viral smears &       culture, and fungal & AFB analysis and cytology. 180 mL of fluid were       instilled. 100 mL were returned. The return was mucopurulent. There were       no mucoid plugs in the return fluid. Multiple specimens were obtained       and pooled into one specimen, which was sent for analysis.      Transbronchial biopsies of an area of infiltration were performed in the       anterior segment of the left upper lobe and in the superior  lingula       segment of the left upper lobe using alligator forceps and sent for       histopathology examination. The procedure was guided by fluoroscopy.       Transbronchial biopsy technique was selected because the  sampling site       was not visible endoscopically. Two biopsy passes were performed. Two       biopsy samples were obtained. Impression:           - Difficulty getting adequate sedation.                       - Bronchoalveolar lavage was performed.                       - Transbronchial lung biopsies were performed. Moderate Sedation:      Moderate (conscious) sedation was administered by the endoscopy nurse       and supervised by the endoscopist. The following parameters were       monitored: oxygen saturation, heart rate, blood pressure, and response       to care. Total physician intraservice time was 15 minutes. It was       diffult to fully sedate the patient and she required a lot of meds Recommendation:       - Await BAL and biopsy results.                       - Follow up with bronchoscopist as previously scheduled. Procedure Code(s):    --- Professional ---                       [phone], Bronchoscopy, rigid or flexible, including                        fluoroscopic guidance, when performed; with                        transbronchial lung biopsy(s), single lobe                       [id number], Bronchoscopy, rigid or flexible, including                        fluoroscopic guidance, when performed; with bronchial                        alveolar lavage                       [id number], Moderate sedation services provided by the same                        physician or other qualified health care professional                        performing the diagnostic or therapeutic service that                        the sedation supports, requiring the presence of an                        independent trained observer to assist in the  monitoring of the patient's level of consciousness and                        physiological status; initial 15 minutes of                        intraservice time, patient age 107 years or older Diagnosis Code(s):    --- Professional ---                        R91.8, Other nonspecific abnormal finding of lung field CPT copyright 2016 American Medical Association. All rights reserved. The codes documented in this report are preliminary and upon coder review may  be revised to meet current compliance requirements. Marshell Garfinkel, MD 10/08/2016 11:02:49 AM Number of Addenda: 0

## 2016-10-08 NOTE — Discharge Instructions (Signed)
Flexible Bronchoscopy, Care After  Flexible Bronchoscopy, Care After These instructions give you information on caring for yourself after your procedure. Your doctor may also give you more specific instructions. Call your doctor if you have any problems or questions after your procedure. Follow these instructions at home:  Do not eat or drink anything for 2 hours after your procedure. If you try to eat or drink before the medicine wears off, food or drink could go into your lungs. You could also burn yourself.  After 2 hours have passed and when you can cough and gag normally, you may eat soft food and drink liquids slowly.  The day after the test, you may eat your normal diet.  You may do your normal activities.  Keep all doctor visits. Get help right away if:  You get more and more short of breath.  You get light-headed.  You feel like you are going to pass out (faint).  You have chest pain.  You have new problems that worry you.  You cough up more than a little blood.  You cough up more blood than before. This information is not intended to replace advice given to you by your health care provider. Make sure you discuss any questions you have with your health care provider. Document Released: 01/31/2009 Document Revised: 09/11/2015 Document Reviewed: 12/08/2012 Elsevier Interactive Patient Education  2017 [place].  Nothing to eat or drink until   12:30p m today  10/08/2016

## 2016-10-08 NOTE — Telephone Encounter (Signed)
Attempted to contact [facility]. I was placed on a long hold with no one coming to the line. Will try back.

## 2016-10-08 NOTE — H&P (Signed)
Pt seen in bronch suite Continuing eval for recurrent respiratory failure with lung infiltrates See clinic note from 09/22/16 for full details Patient has been off prednisone for 5 days with stable resp status High res CT 6/21 images reviewed  Blood pressure 110/66, pulse 92, temperature 98.1 F (36.7 C), temperature source Oral, resp. rate 16, last menstrual period 09/27/2016, SpO2 96 %. Gen:      No acute distress HEENT:  EOMI, sclera anicteric Neck:     No masses; no thyromegaly Lungs:    Clear to auscultation bilaterally; normal respiratory effort CV:         Regular rate and rhythm; no murmurs Abd:      + bowel sounds; soft, non-tender; no palpable masses, no distension Ext:    No edema; adequate peripheral perfusion Skin:      Warm and dry; no rash Neuro: alert and oriented x 3 Psych: normal mood and affect  Assessment/Plan: Moderate persistent asthma Resp failure with lung infiltrates Peripheral eosinophilia  Risks, benefits of procedure discussed with pt Will proceed with bronchoscopy with BAL, biopsy  Marshell Garfinkel MD [facility] Pager [phone] If no answer or after 3pm call: [phone] 10/08/2016, 10:52 AM

## 2016-10-08 NOTE — Telephone Encounter (Signed)
I spoke with the patient. Thanks PM

## 2016-10-09 LAB — ACID FAST SMEAR (AFB, MYCOBACTERIA): Acid Fast Smear: NEGATIVE

## 2016-10-10 ENCOUNTER — Other Ambulatory Visit: Payer: Self-pay | Admitting: Neurology

## 2016-10-10 ENCOUNTER — Encounter (HOSPITAL_COMMUNITY): Payer: Self-pay | Admitting: Pulmonary Disease

## 2016-10-10 LAB — CULTURE, BAL-QUANTITATIVE: SPECIAL REQUESTS: NORMAL

## 2016-10-10 LAB — CULTURE, BAL-QUANTITATIVE W GRAM STAIN: Culture: 4000 — AB

## 2016-10-11 ENCOUNTER — Ambulatory Visit (INDEPENDENT_AMBULATORY_CARE_PROVIDER_SITE_OTHER): Payer: BC Managed Care – PPO | Admitting: *Deleted

## 2016-10-11 DIAGNOSIS — J4550 Severe persistent asthma, uncomplicated: Secondary | ICD-10-CM

## 2016-10-11 LAB — PATHOLOGIST SMEAR REVIEW

## 2016-10-11 LAB — PNEUMOCYSTIS JIROVECI SMEAR BY DFA: Pneumocystis jiroveci Ag: NEGATIVE

## 2016-10-11 MED ORDER — AMOXICILLIN-POT CLAVULANATE 875-125 MG PO TABS: 1.0000 | ORAL_TABLET | Freq: Two times a day (BID) | ORAL | 0 refills | Status: DC

## 2016-10-11 MED ORDER — MEPOLIZUMAB 100 MG ~~LOC~~ SOLR
100.0000 mg | SUBCUTANEOUS | Status: DC
  Administered 2016-10-11 – 2017-05-31 (×9): 100 mg via SUBCUTANEOUS

## 2016-10-11 NOTE — Telephone Encounter (Signed)
Spoke with pt, who states she is taken zpak and 1/2 dose seroquel with no new symptoms.  Pt advised to contact our office if she develops any new or worsen symptoms. Nothing further needed.

## 2016-10-11 NOTE — Telephone Encounter (Signed)
Pt returning call to nurse and can be reached @ [phone].Caren GriffinsStanley A Dalton

## 2016-10-11 NOTE — Telephone Encounter (Signed)
I have spoke with [facility], who states pt picked up Rx on 10/08/16. Pt told pharmacist that she is typically instructed by her provider to cut seroquel in half while taken zpak.  lmtcb x1 for pt.   Will route to PM to make aware.

## 2016-10-11 NOTE — Telephone Encounter (Signed)
That is fine then to continue the z pak and use 1/2 dose seroquel.  PM

## 2016-10-11 NOTE — Telephone Encounter (Signed)
Pharmacy was calling to make PM aware that the azithromycin and the seroquel has a possible  Interaction.  PM please advise thanks

## 2016-10-11 NOTE — Telephone Encounter (Signed)
Change antibiotics to Augmentin 875 mg bid for 7 days. Thanks  PM

## 2016-10-13 ENCOUNTER — Encounter: Payer: Self-pay | Admitting: Neurology

## 2016-10-13 ENCOUNTER — Ambulatory Visit (INDEPENDENT_AMBULATORY_CARE_PROVIDER_SITE_OTHER): Payer: BC Managed Care – PPO | Admitting: Neurology

## 2016-10-13 VITALS — BP 115/76 | HR 108 | Resp 20 | Ht 64.0 in | Wt 267.0 lb

## 2016-10-13 DIAGNOSIS — G8929 Other chronic pain: Secondary | ICD-10-CM

## 2016-10-13 DIAGNOSIS — G4700 Insomnia, unspecified: Secondary | ICD-10-CM

## 2016-10-13 DIAGNOSIS — M545 Low back pain: Secondary | ICD-10-CM | POA: Diagnosis not present

## 2016-10-13 DIAGNOSIS — R200 Anesthesia of skin: Secondary | ICD-10-CM

## 2016-10-13 DIAGNOSIS — G629 Polyneuropathy, unspecified: Secondary | ICD-10-CM

## 2016-10-13 DIAGNOSIS — F1011 Alcohol abuse, in remission: Secondary | ICD-10-CM

## 2016-10-13 LAB — ACID FAST CULTURE WITH REFLEXED SENSITIVITIES (MYCOBACTERIA): Acid Fast Culture: NEGATIVE

## 2016-10-13 LAB — ACID FAST CULTURE WITH REFLEXED SENSITIVITIES

## 2016-10-13 MED ORDER — GABAPENTIN 300 MG PO CAPS: 900.0000 mg | ORAL_CAPSULE | Freq: Two times a day (BID) | ORAL | 4 refills | Status: DC

## 2016-10-13 NOTE — Progress Notes (Signed)
[facility]  PATIENT: Kara [name] DOB: [birth year]  REFERRING DOCTOR OR PCP:  Gildardo Cranker SOURCE: Patient and EMR / ED records.     _________________________________   HISTORICAL  CHIEF COMPLAINT:  Chief Complaint  Patient presents with  . Polyneuropathy    She is now taking Gabapentin 900mg  in the am, 900mg  qhs and sts. it works well.  She has tried weaning off b/c she feels dose is excessive, but experiences worsening pain when she does/fim    HISTORY OF PRESENT ILLNESS:  Kara [name] is a 31 year old woman with polyneuropathy and dysesthesias.      Polyneuropathy:   She changed her dose to 900 mg twice a day. She is tolerating that well. She does not feel sleepy during the daytime. Her dysesthetic pain is much better on this dose. She has more pain if she misses several doses.    She tries to exercise more regularly and is eating better.  Polyneuropathy history:   She has a history of alcohol abuse and began to notice difficulty with her balance and walking in March, 2016 after a severe gastroenteritis hospitalization.   She lost 40 pounds with that hospitalization.       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 There is right greater than left foraminal narrowing. There is no spinal cord abnormality noted.   MRI of the  brain was normal.     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    Other:   She gained weight while in the hospital last month as she was on steroids.    She has had pneumonia multiple times.   Eosinophils have been high prompting the use of steroids. She also has asthma.   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    LBP/neck pain:  She had more more back pain while in the hospital but its not too muc.   Initially, gabapentin helped her pain.    Alcohol history:     She is a recovering alcoholic.    She has no recent alcohol use.   Now she is sober x 6 months.      REVIEW OF SYSTEMS: Constitutional: No fevers, chills, sweats, or change in appetite.   Some insomnia Eyes: No visual changes, double vision, eye pain Ear, nose and throat: No hearing loss, ear pain, nasal congestion, sore throat Cardiovascular: No chest pain, palpitations Respiratory: No shortness of breath at rest or with exertion.   No wheezes GastrointestinaI: as above Genitourinary: No dysuria, urinary retention or frequency.  No nocturia. Musculoskeletal: No neck pain, back pain Integumentary: No rash, pruritus, skin lesions Neurological: as above Psychiatric: No depression at this time.  Some anxiety Endocrine: No palpitations, diaphoresis, change in appetite, change in weigh or increased thirst Hematologic/Lymphatic: No anemia, purpura, petechiae. Allergic/Immunologic: No itchy/runny eyes, nasal congestion, recent allergic reactions, rashes  ALLERGIES: Allergies  Allergen Reactions  . Peanuts [Peanut Oil] Hives and Itching    HOME MEDICATIONS:  Current Outpatient Prescriptions:  .  albuterol (PROAIR HFA) 108 (90 Base) MCG/ACT inhaler, Inhale 2 puffs into the lungs every 4 (four) hours as needed for wheezing or shortness of breath., Disp: 1 Inhaler, Rfl: 1 .  albuterol (PROVENTIL) (2.5 MG/3ML) 0.083% nebulizer solution, INHALE THE CONTENTS OF 1VIAL VIA NEBULIZER Q 4-6 H PRN, Disp: , Rfl: 5 .  budesonide-formoterol (SYMBICORT) 160-4.5  MCG/ACT inhaler, Inhale 2 puffs into the lungs 2 (two) times daily., Disp: 3 Inhaler, Rfl: 1 .  chlorhexidine (PERIDEX) 0.12 % solution, Use as directed 15 mLs in the mouth or throat 2 (two) times daily. After meals, Disp: , Rfl:  .  Cholecalciferol (VITAMIN D PO), Take 1 tablet by mouth daily. , Disp: , Rfl:  .  EPINEPHrine (EPIPEN 2-PAK) 0.3 mg/0.3 mL IJ SOAJ injection, Inject 0.3 mg into the muscle once as needed (for severe allergic reaction)., Disp: , Rfl:  .  gabapentin  (NEURONTIN) 300 MG capsule, Take 3 capsules (900 mg total) by mouth 2 (two) times daily., Disp: 540 capsule, Rfl: 4 .  ipratropium-albuterol (DUONEB) 0.5-2.5 (3) MG/3ML SOLN, USE 1 VIAL PER NEBULIZER EVERY 6 HOURS  FOR 30 DAYS, Disp: , Rfl: 6 .  Melatonin 10 MG TABS, Take 10 mg by mouth at bedtime., Disp: , Rfl:  .  meloxicam (MOBIC) 15 MG tablet, Take 1 tablet (15 mg total) by mouth daily as needed for pain., Disp: 30 tablet, Rfl: 0 .  metoprolol succinate (TOPROL-XL) 25 MG 24 hr tablet, Take 1 tablet (25 mg total) by mouth daily. *Please call and schedule a one year follow up appointment*, Disp: 90 tablet, Rfl: 0 .  montelukast (SINGULAIR) 10 MG tablet, Take 1 tablet (10 mg total) by mouth daily., Disp: 90 tablet, Rfl: 1 .  Multiple Vitamins-Minerals (MULTIVITAMIN WITH MINERALS) tablet, Take 1 tablet by mouth daily., Disp: , Rfl:  .  naltrexone (DEPADE) 50 MG tablet, Take 50 mg by mouth daily., Disp: , Rfl:  .  nicotine (NICODERM CQ - DOSED IN MG/24 HOURS) 21 mg/24hr patch, Place 1 patch (21 mg total) onto the skin daily., Disp: 90 patch, Rfl: 0 .  omeprazole (PRILOSEC) 40 MG capsule, TAKE 1 CAPSULE(40 MG) BY MOUTH DAILY, Disp: 30 capsule, Rfl: 0 .  QUEtiapine (SEROQUEL) 50 MG tablet, Take 100 mg by mouth at bedtime. , Disp: , Rfl:  .  sertraline (ZOLOFT) 50 MG tablet, Take 75 mg by mouth daily., Disp: , Rfl:  .  thiamine 100 MG tablet, Take 100 mg by mouth daily., Disp: , Rfl:   Current Facility-Administered Medications:  Marland Kitchen  Mepolizumab SOLR 100 mg, 100 mg, Subcutaneous, Q28 days, Kozlow, Alvira Philips, MD, 100 mg at 10/11/16 1554  PAST MEDICAL HISTORY: Past Medical History:  Diagnosis Date  . Alcohol abuse   . Asthma   . Environmental allergies   . GERD (gastroesophageal reflux disease)   . Headache   . Hypertension   . Pancreatitis   . Prolonged QT syndrome   . Syncope and collapse   . Vision abnormalities     PAST SURGICAL HISTORY: Past Surgical History:  Procedure Laterality Date    . ESOPHAGOGASTRODUODENOSCOPY N/A 07/17/2014   Procedure: ESOPHAGOGASTRODUODENOSCOPY (EGD);  Surgeon: Dorena Cookey, MD;  Location: [facility];  Service: Endoscopy;  Laterality: N/A;  . TYMPANOSTOMY TUBE PLACEMENT    . VIDEO BRONCHOSCOPY Bilateral 10/08/2016   Procedure: VIDEO BRONCHOSCOPY WITH FLUORO;  Surgeon: Chilton Greathouse, MD;  Location: [facility];  Service: Cardiopulmonary;  Laterality: Bilateral;    FAMILY HISTORY: Family History  Problem Relation Age of Onset  . Heart failure Mother   . Heart failure Father   . Diabetes Father   . Heart failure Maternal Grandfather     SOCIAL HISTORY:  Social History   Social History  . Marital status: Married    Spouse name: [name]  . Number of children: 0  . Years of education:  N/A   Occupational History  . College advisor    Social History Main Topics  . Smoking status: Current Every Day Smoker    Packs/day: 1.00    Years: 5.00    Types: Cigarettes    Last attempt to quit: 04/08/2012  . Smokeless tobacco: Never Used     Comment: currently smoking 1/2ppd (09/09/16)  . Alcohol use No  . Drug use: No  . Sexual activity: Yes    Birth control/ protection: None   Other Topics Concern  . Not on file   Social History Narrative   Married.     PHYSICAL EXAM  Vitals:   10/13/16 0823  BP: 115/76  Pulse: (!) 108  Resp: 20  Weight: 267 lb (121.1 kg)  Height: 5\' 4"  (1.626 m)    Body mass index is 45.83 kg/m.   General: The patient is well-developed and well-nourished and in no acute distress  Neck:  The neck is nontender.  Musculoskeletal/Ext:  Back is nontender. She has edema at the ankles.  Neurologic Exam  Mental status: She is alert and oriented 3. Remote and recent memory is normal. She appears to have a normal attention span  Speech is normal.  Cranial nerves: Extraocular movements are full.  . Facial strength and sensation is normal..  Trapezius and sternocleidomastoid strength is normal. No  dysarthria is noted.    Motor:  Muscle bulk is normal.   Tone is normal. Strength is  5 / 5 in all 4 extremities.   Sensory: Sensation to touch and vibration and temperature is normal in the arms, legs and feet. This is improved from her initial visit.  Gait and station: Station is normal.   Gait is normal. Tandem gait is now normal.   Reflexes: Deep tendon reflexes are symmetric and normal bilaterally.      DIAGNOSTIC DATA (LABS, IMAGING, TESTING) - I reviewed patient records, labs, notes, testing and imaging myself where available.  Lab Results  Component Value Date   WBC 12.2 (H) 09/01/2016   HGB 12.1 09/01/2016   HCT 39.1 09/01/2016   MCV 84.3 09/01/2016   PLT 227 09/01/2016      Component Value Date/Time   NA 138 09/01/2016 0547   NA 142 09/02/2014 1507   K 4.0 09/01/2016 0547   CL 103 09/01/2016 0547   CO2 27 09/01/2016 0547   GLUCOSE 139 (H) 09/01/2016 0547   BUN 27 (H) 09/01/2016 0547   BUN 7 09/02/2014 1507   CREATININE 0.71 09/01/2016 0547   CALCIUM 9.3 09/20/2016 1619   PROT 6.1 (L) 09/01/2016 0547   PROT 7.6 09/02/2014 1507   ALBUMIN 3.0 (L) 09/01/2016 0547   ALBUMIN 4.1 09/02/2014 1507   AST 13 (L) 09/01/2016 0547   ALT 13 (L) 09/01/2016 0547   ALKPHOS 55 09/01/2016 0547   BILITOT 0.3 09/01/2016 0547   BILITOT 0.7 09/02/2014 1507   GFRNONAA >60 09/01/2016 0547   GFRAA >60 09/01/2016 0547    Lab Results  Component Value Date   VITAMINB12 632 09/02/2014   Lab Results  Component Value Date   TSH 0.219 (L) 06/13/2016       ASSESSMENT AND PLAN  Polyneuropathy  Alcohol abuse, in remission  Chronic low back pain, unspecified back pain laterality, with sciatica presence unspecified  Insomnia, unspecified type  Numbness of foot    1.   Change gabapentin to 900 mg by mouth twice a day has twice-daily dosing is more convenient for her. He tolerates this  dose well and it is effective in controlling her symptoms. 2,  we discussed trying to  exercise stay active and try to lose some weight. 3.  Return to see me in one year or sooner if there are new or worsening neurologic symptoms.  Richard A. Epimenio FootSater, MD, PhD, Larene BeachFAAN    10/13/2016, 9:00 AM Certified in Neurology, Clinical Neurophysiology, Sleep Medicine, Pain Medicine and Neuroimaging  [facility] [address], [address] [place], [place] [address] [phone]

## 2016-10-14 NOTE — Progress Notes (Signed)
Patient ID: Kara [name], female   DOB: [birth year], 31 y.o.   MRN: [id number]     Cardiology Office Note   Date:  10/15/2016   ID:  Kara [name], DOB 1986/04/05, MRN [id number]  PCP:  [name], MD  Cardiologist:   Charlton Haws, MD   No chief complaint on file.     History of Present Illness: Kara [name] is a 31 y.o. female who presents for f/u  palpitations and syncope.  Seen in hospital 12/15  PMH of ETOH abuse and asthma who was brought to the hospital by her husband after having a syncopal episode after walking to the bathroom (had some nausea). She feels like she blacked out for a few seconds. No prodromal symptoms except for the sudden onset of nausea. Vomited after coming to the ED. No complaints of chest pains, shortness of breath or palpitations. The patient denies any recent medication changes, no recent similar symptoms. There is no family history of sudden death. She does report an episode of hematochezia over a week ago, and has not had any since that time. Upon initial evaluation in the ED, the patient's heart rate is 110-130 with a QTC greater than 600. Hemoglobin is normal at 13.8.  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 Sister but no sudden death.  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 She underwent IR guided embolization of the IMA branches.   Past Medical History:  Diagnosis Date  . Alcohol abuse   . Asthma   . Environmental allergies   . GERD  (gastroesophageal reflux disease)   . Headache   . Hypertension   . Pancreatitis   . Prolonged QT syndrome   . Syncope and collapse   . Vision abnormalities     Past Surgical History:  Procedure Laterality Date  . ESOPHAGOGASTRODUODENOSCOPY N/A 07/17/2014   Procedure: ESOPHAGOGASTRODUODENOSCOPY (EGD);  Surgeon: Dorena Cookey, MD;  Location: [facility];  Service: Endoscopy;  Laterality: N/A;  . TYMPANOSTOMY TUBE PLACEMENT    . VIDEO BRONCHOSCOPY Bilateral 10/08/2016   Procedure: VIDEO BRONCHOSCOPY WITH FLUORO;  Surgeon: Chilton Greathouse, MD;  Location: [facility];  Service: Cardiopulmonary;  Laterality: Bilateral;     Current Outpatient Prescriptions  Medication Sig Dispense Refill  . albuterol (PROAIR HFA) 108 (90 Base) MCG/ACT inhaler Inhale 2 puffs into the lungs every 4 (four) hours as needed for wheezing or shortness of breath. 1 Inhaler 1  . albuterol (PROVENTIL) (2.5 MG/3ML) 0.083% nebulizer solution INHALE THE CONTENTS OF 1VIAL VIA NEBULIZER Q 4-6 H PRN  5  . budesonide-formoterol (SYMBICORT) 160-4.5 MCG/ACT inhaler Inhale 2 puffs into the lungs 2 (two) times daily. 3 Inhaler 1  . chlorhexidine (PERIDEX) 0.12 % solution Use as directed 15 mLs in the mouth or throat 2 (two) times daily. After meals    . Cholecalciferol (VITAMIN D PO) Take 1 tablet by mouth daily.     Marland Kitchen EPINEPHrine (EPIPEN 2-PAK) 0.3 mg/0.3 mL IJ SOAJ injection Inject 0.3 mg into the muscle once as needed (for severe  allergic reaction).    . furosemide (LASIX) 20 MG tablet Take 20 mg by mouth as directed.    . gabapentin (NEURONTIN) 300 MG capsule Take 3 capsules (900 mg total) by mouth 2 (two) times daily. 540 capsule 4  . ipratropium-albuterol (DUONEB) 0.5-2.5 (3) MG/3ML SOLN USE 1 VIAL PER NEBULIZER EVERY 6 HOURS  FOR 30 DAYS  6  . Melatonin 10 MG TABS Take 10 mg by mouth at bedtime.    . meloxicam (MOBIC) 15 MG tablet Take 1 tablet (15 mg total) by mouth daily as needed for pain. 30 tablet 0  . metoprolol  succinate (TOPROL-XL) 25 MG 24 hr tablet Take 1 tablet (25 mg total) by mouth daily. *Please call and schedule a one year follow up appointment* 90 tablet 0  . montelukast (SINGULAIR) 10 MG tablet Take 1 tablet (10 mg total) by mouth daily. 90 tablet 1  . Multiple Vitamins-Minerals (MULTIVITAMIN WITH MINERALS) tablet Take 1 tablet by mouth daily.    . naltrexone (DEPADE) 50 MG tablet Take 50 mg by mouth daily.    . nicotine (NICODERM CQ - DOSED IN MG/24 HOURS) 21 mg/24hr patch Place 1 patch (21 mg total) onto the skin daily. 90 patch 0  . omeprazole (PRILOSEC) 40 MG capsule TAKE 1 CAPSULE(40 MG) BY MOUTH DAILY 30 capsule 0  . QUEtiapine (SEROQUEL) 50 MG tablet Take 100 mg by mouth at bedtime.     . sertraline (ZOLOFT) 50 MG tablet Take 75 mg by mouth daily.    Marland Kitchen thiamine 100 MG tablet Take 100 mg by mouth daily.     Current Facility-Administered Medications  Medication Dose Route Frequency Provider Last Rate Last Dose  . Mepolizumab SOLR 100 mg  100 mg Subcutaneous Q28 days Jessica Priest, MD   100 mg at 10/11/16 1554    Allergies:   Peanuts [peanut oil]    Social History:  The patient  reports that she has been smoking Cigarettes.  She has a 5.00 pack-year smoking history. She has never used smokeless tobacco. She reports that she does not drink alcohol or use drugs.   Family History:  The patient's family history includes Diabetes in her father; Heart failure in her father, maternal grandfather, and mother.    ROS:  Please see the history of present illness.   Otherwise, review of systems are positive for none.   All other systems are reviewed and negative.    PHYSICAL EXAM: VS:  BP 112/68   Pulse (!) 109   Ht 5\' 4"  (1.626 m)   Wt 109.1 kg (240 lb 9.6 oz)   LMP 09/27/2016   SpO2 95%   BMI 41.30 kg/m  , BMI Body mass index is 41.3 kg/m. Affect appropriate Overweight white female  HEENT: normal Neck supple with no adenopathy JVP normal no bruits no thyromegaly Lungs clear  with no wheezing and good diaphragmatic motion Heart:  S1/S2 no murmur, no rub, gallop or click PMI normal Abdomen: benighn, BS positve, no tenderness, no AAA no bruit.  No HSM or HJR Distal pulses intact with no bruits Plus one bilateral edema Neuro non-focal Skin warm and dry No muscular weakness    EKG:  12/ 21/15  ST rate 131  QT 434   12/22 /15 SR rate 92  QT 412  Normal  07/28/14    Recent Labs: 06/12/2016: B Natriuretic Peptide 21.7 06/13/2016: TSH 0.219 09/01/2016: ALT 13; BUN 27; Creatinine, Ser 0.71; Hemoglobin 12.1; Magnesium 1.9; Platelets 227; Potassium 4.0;  Sodium 138    Lipid Panel    Component Value Date/Time   CHOL 174 09/09/2015 1724   TRIG 87 09/09/2015 1724   HDL 57 09/09/2015 1724   CHOLHDL 3.1 09/09/2015 1724   VLDL 17 09/09/2015 1724   LDLCALC 100 (H) 09/09/2015 1724      Wt Readings from Last 3 Encounters:  10/15/16 109.1 kg (240 lb 9.6 oz)  10/13/16 121.1 kg (267 lb)  09/22/16 118.3 kg (260 lb 12.8 oz)      Other studies Reviewed: Additional studies/ records that were reviewed today include: Hosptial admission records, ECG;s 12/15 . ST rate 123 QT 342     ASSESSMENT AND PLAN:  1.  Syncope:  Etiology not clear. Normal ETT and Normal EF by echo  Observe  2. ETOH Abuse  Sober 6 months congratulated her  3. Asthma/pneumonia:  F/u [place] pulmonary taper steroids exam ok today  4. Reflux:  Discussed low carb diet eating meals earlier and blocks under bed 5. Edema likely from weight and steroids PRN lasix called in BMET today  6. Tachycardia:  Increase beta blocker 50 mg daily ECG normal otherwise   Current medicines are reviewed at length with the patient today.  The patient does not have concerns regarding medicines.  The following changes have been made:  no change  Labs/ tests ordered today include:     No orders of the defined types were placed in this encounter.    Disposition:   FU with me in a year     Signed, Charlton HawsPeter Sharion Grieves,  MD  10/15/2016 8:14 AM    [facility] [address], [place], [place]  [address] Phone: [phone]) [phone]; Fax: [phone]

## 2016-10-15 ENCOUNTER — Ambulatory Visit (INDEPENDENT_AMBULATORY_CARE_PROVIDER_SITE_OTHER): Payer: BC Managed Care – PPO | Admitting: Cardiovascular Disease

## 2016-10-15 VITALS — BP 112/68 | HR 109 | Ht 64.0 in | Wt 240.6 lb

## 2016-10-15 DIAGNOSIS — I5032 Chronic diastolic (congestive) heart failure: Secondary | ICD-10-CM | POA: Diagnosis not present

## 2016-10-15 DIAGNOSIS — I10 Essential (primary) hypertension: Secondary | ICD-10-CM

## 2016-10-15 DIAGNOSIS — Z79899 Other long term (current) drug therapy: Secondary | ICD-10-CM

## 2016-10-15 LAB — BASIC METABOLIC PANEL
BUN/Creatinine Ratio: 13 (ref 9–23)
BUN: 11 mg/dL (ref 6–20)
CO2: 24 mmol/L (ref 20–29)
CREATININE: 0.84 mg/dL (ref 0.57–1.00)
Calcium: 9.8 mg/dL (ref 8.7–10.2)
Chloride: 97 mmol/L (ref 96–106)
GFR calc Af Amer: 106 mL/min/{1.73_m2} (ref >59)
GFR calc non Af Amer: 92 mL/min/{1.73_m2} (ref >59)
GLUCOSE: 103 mg/dL — AB (ref 65–99)
Potassium: 4.3 mmol/L (ref 3.5–5.2)
SODIUM: 139 mmol/L (ref 134–144)

## 2016-10-15 MED ORDER — METOPROLOL SUCCINATE ER 50 MG PO TB24: 50.0000 mg | ORAL_TABLET | Freq: Every day | ORAL | 3 refills | Status: DC

## 2016-10-15 MED ORDER — FUROSEMIDE 20 MG PO TABS: 20.0000 mg | ORAL_TABLET | Freq: Every day | ORAL | 3 refills | Status: DC

## 2016-10-15 NOTE — Patient Instructions (Signed)
Medication Instructions:  Your physician has recommended you make the following change in your medication:  1-START Furosemide 20 mg by mouth daily 2-START Metoprolol 50 mg by mouth daily  Labwork: Your physician recommends that you have lab work today- BMET  Testing/Procedures: NONE  Follow-Up: Your physician wants you to follow-up in: 6 months with Dr. Eden EmmsNishan. You will receive a reminder letter in the mail two months in advance. If you don't receive a letter, please call our office to schedule the follow-up appointment.   If you need a refill on your cardiac medications before your next appointment, please call your pharmacy.

## 2016-10-18 ENCOUNTER — Other Ambulatory Visit: Payer: Self-pay | Admitting: Allergy and Immunology

## 2016-10-18 MED ORDER — OMEPRAZOLE 40 MG PO CPDR: DELAYED_RELEASE_CAPSULE | ORAL | 1 refills | Status: DC

## 2016-10-18 MED ORDER — CHLORHEXIDINE GLUCONATE 0.12 % MT SOLN: 15.0000 mL | Freq: Two times a day (BID) | OROMUCOSAL | 5 refills | Status: DC

## 2016-10-18 MED ORDER — OMEPRAZOLE 40 MG PO CPDR: DELAYED_RELEASE_CAPSULE | ORAL | 5 refills | Status: DC

## 2016-10-18 MED ORDER — CHLORHEXIDINE GLUCONATE 0.12 % MT SOLN: 15.0000 mL | Freq: Two times a day (BID) | OROMUCOSAL | 1 refills | Status: DC

## 2016-10-18 NOTE — Telephone Encounter (Signed)
patient needs a refill on OMEPRAZOLE and CHLORHEXADINE called into [facility] on [address]

## 2016-10-18 NOTE — Addendum Note (Signed)
Addended by: Mliss FritzBLACK, Azaliah Carrero I on: 10/18/2016 12:08 PM   Modules accepted: Orders

## 2016-10-18 NOTE — Telephone Encounter (Signed)
Prescriptions have been sent to patient's pharmacy.

## 2016-11-09 ENCOUNTER — Ambulatory Visit (INDEPENDENT_AMBULATORY_CARE_PROVIDER_SITE_OTHER): Payer: BC Managed Care – PPO | Admitting: *Deleted

## 2016-11-09 DIAGNOSIS — J4550 Severe persistent asthma, uncomplicated: Secondary | ICD-10-CM | POA: Diagnosis not present

## 2016-11-10 LAB — FUNGUS CULTURE RESULT

## 2016-11-10 LAB — FUNGAL ORGANISM REFLEX

## 2016-11-10 LAB — FUNGUS CULTURE WITH STAIN

## 2016-11-14 ENCOUNTER — Other Ambulatory Visit: Payer: Self-pay | Admitting: Allergy and Immunology

## 2016-11-15 ENCOUNTER — Encounter: Payer: Self-pay | Admitting: Pulmonary Disease

## 2016-11-15 ENCOUNTER — Ambulatory Visit (INDEPENDENT_AMBULATORY_CARE_PROVIDER_SITE_OTHER)
Admission: RE | Admit: 2016-11-15 | Discharge: 2016-11-15 | Disposition: A | Discharge status: Home / Self Care | Payer: BC Managed Care – PPO | Source: Ambulatory Visit | Attending: Pulmonary Disease | Admitting: Pulmonary Disease

## 2016-11-15 ENCOUNTER — Ambulatory Visit (INDEPENDENT_AMBULATORY_CARE_PROVIDER_SITE_OTHER): Payer: BC Managed Care – PPO | Admitting: Pulmonary Disease

## 2016-11-15 ENCOUNTER — Ambulatory Visit: Payer: BC Managed Care – PPO | Admitting: Neurology

## 2016-11-15 VITALS — BP 122/74 | HR 86 | Ht 64.0 in | Wt 262.6 lb

## 2016-11-15 DIAGNOSIS — J45901 Unspecified asthma with (acute) exacerbation: Secondary | ICD-10-CM

## 2016-11-15 DIAGNOSIS — R0602 Shortness of breath: Secondary | ICD-10-CM | POA: Diagnosis not present

## 2016-11-15 MED ORDER — UMECLIDINIUM BROMIDE 62.5 MCG/INH IN AEPB: 1.0000 | INHALATION_SPRAY | Freq: Every day | RESPIRATORY_TRACT | 0 refills | Status: AC

## 2016-11-15 MED ORDER — FLUTICASONE FUROATE-VILANTEROL 200-25 MCG/INH IN AEPB: 1.0000 | INHALATION_SPRAY | Freq: Every day | RESPIRATORY_TRACT | 0 refills | Status: AC

## 2016-11-15 MED ORDER — DOXYCYCLINE HYCLATE 100 MG PO TABS: 100.0000 mg | ORAL_TABLET | Freq: Two times a day (BID) | ORAL | 0 refills | Status: DC

## 2016-11-15 MED ORDER — UMECLIDINIUM BROMIDE 62.5 MCG/INH IN AEPB
1.0000 | INHALATION_SPRAY | Freq: Every day | RESPIRATORY_TRACT | 6 refills | Status: DC
Start: 2016-11-15 — End: 2017-05-18

## 2016-11-15 MED ORDER — PREDNISONE 10 MG PO TABS: ORAL_TABLET | ORAL | 0 refills | Status: DC

## 2016-11-15 MED ORDER — FLUTICASONE FUROATE-VILANTEROL 200-25 MCG/INH IN AEPB: 1.0000 | INHALATION_SPRAY | Freq: Every day | RESPIRATORY_TRACT | 6 refills | Status: DC

## 2016-11-15 NOTE — Patient Instructions (Addendum)
We'll get a chest x-ray today I will call in Doxycycline 100 mg 2 times daily for 7 days Will start prednisone starting at 40 mg. Reduce dose by 10 mg every 3 days  We will stop Symbicort and started on Breo 200 and Incruse.  Return in 1-2 months

## 2016-11-15 NOTE — Progress Notes (Signed)
Kara [name]    [id number]    1985/06/05  Primary Care Physician:Ross, Dwyane Luo, MD  Referring Physician: Lawerance Cruel, MD [place], [place] [address]  Chief complaint:  Follow up for  Asthma Eosinophilic pneumonia  HPI: Kara [name] is a 31 year old with medical history of asthma, alcohol abuse, pancreatitis. She was diagnosed with asthma about 15 years ago and has been tested for allergies before and reports sensitivity to cats, dogs, pollen, mold and multiple other allergens. She has seasonal allergies, postnasal drip and acid reflux.  She has been hospitalized at [facility] in February and April 2018 for recurrent pneumonia treated with antibiotics, steroids, nebulizers. All cultures were negative. She was negative for flu and viral infections or the RVP.  Readmitted in May 2018 with recurrent respiratory failure. Noted to have elevated eosinophils on admission. She was treated with antibiotics and discharged on a prolonged taper. She has been evaluated at the [facility] by Dr. Neldon Mc for [place].  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 Exposures: No known exposures at work or at home, no mold issues Smoking history: Smokes a pack a day for past 5 years. Last alcohol use was in December 2017.  Interim History: She underwent a bronchoscopy on 10/08/16 which showed an elevated eosinophils in the BAL. Path was negative for malignancy and flow cytometry for CD4 CD8 ratio could not be performed due to insufficient cells. She has started on new nucala and has received 2 doses so far.  She still has dyspnea with congestion, wheezing, cough. 4 days ago she had paroxysmal cough which caused her to pass out. An evaluation with EKG and echo was normal.  Outpatient Encounter Prescriptions as of 11/15/2016  Medication Sig  . albuterol (PROVENTIL) (2.5 MG/3ML) 0.083% nebulizer solution INHALE THE CONTENTS OF 1VIAL VIA NEBULIZER  Q 4-6 H PRN  . budesonide-formoterol (SYMBICORT) 160-4.5 MCG/ACT inhaler Inhale 2 puffs into the lungs 2 (two) times daily.  . chlorhexidine (PERIDEX) 0.12 % solution Use as directed 15 mLs in the mouth or throat 2 (two) times daily. After meals  . Cholecalciferol (VITAMIN D PO) Take 1 tablet by mouth daily.   Marland Kitchen EPINEPHrine (EPIPEN 2-PAK) 0.3 mg/0.3 mL IJ SOAJ injection Inject 0.3 mg into the muscle once as needed (for severe allergic reaction).  . furosemide (LASIX) 20 MG tablet Take 1 tablet (20 mg total) by mouth daily.  Marland Kitchen gabapentin (NEURONTIN) 300 MG capsule Take 3 capsules (900 mg total) by mouth 2 (two) times daily.  Marland Kitchen ipratropium-albuterol (DUONEB) 0.5-2.5 (3) MG/3ML SOLN USE 1 VIAL PER NEBULIZER EVERY 6 HOURS  FOR 30 DAYS  . Melatonin 10 MG TABS Take 10 mg by mouth at bedtime.  . meloxicam (MOBIC) 15 MG tablet Take 1 tablet (15 mg total) by mouth daily as needed for pain.  . metoprolol succinate (TOPROL-XL) 50 MG 24 hr tablet Take 1 tablet (50 mg total) by mouth daily. Take with or immediately following a meal.  . montelukast (SINGULAIR) 10 MG tablet Take 1 tablet (10 mg total) by mouth daily.  . Multiple Vitamins-Minerals (MULTIVITAMIN WITH MINERALS) tablet Take 1 tablet by mouth daily.  . naltrexone (DEPADE) 50 MG tablet Take 50 mg by mouth daily.  . nicotine (NICODERM CQ - DOSED IN MG/24 HOURS) 21 mg/24hr patch Place 1 patch (21 mg total) onto the skin daily.  Marland Kitchen omeprazole (PRILOSEC) 40 MG capsule TAKE 1 CAPSULE(40 MG) BY  MOUTH DAILY  . PROAIR HFA 108 (90 Base) MCG/ACT inhaler INHALE 2 PUFFS INTO THE LUNGS EVERY 4 HOURS AS NEEDED FOR WHEEZING OR SHORTNESS OF BREATH  . QUEtiapine (SEROQUEL) 50 MG tablet Take 100 mg by mouth at bedtime.   . sertraline (ZOLOFT) 50 MG tablet Take 75 mg by mouth daily.  Marland Kitchen thiamine 100 MG tablet Take 100 mg by mouth daily.   Facility-Administered Encounter Medications as of 11/15/2016  Medication  . Mepolizumab SOLR 100 mg    Allergies as of 11/15/2016  - Review Complete 11/15/2016  Allergen Reaction Noted  . Peanuts [peanut oil] Hives and Itching 07/06/2014    Past Medical History:  Diagnosis Date  . Alcohol abuse   . Asthma   . Environmental allergies   . GERD (gastroesophageal reflux disease)   . Headache   . Hypertension   . Pancreatitis   . Prolonged QT syndrome   . Syncope and collapse   . Vision abnormalities     Past Surgical History:  Procedure Laterality Date  . ESOPHAGOGASTRODUODENOSCOPY N/A 07/17/2014   Procedure: ESOPHAGOGASTRODUODENOSCOPY (EGD);  Surgeon: Teena Irani, MD;  Location: [facility];  Service: Endoscopy;  Laterality: N/A;  . TYMPANOSTOMY TUBE PLACEMENT    . VIDEO BRONCHOSCOPY Bilateral 10/08/2016   Procedure: VIDEO BRONCHOSCOPY WITH FLUORO;  Surgeon: Marshell Garfinkel, MD;  Location: [facility];  Service: Cardiopulmonary;  Laterality: Bilateral;    Family History  Problem Relation Age of Onset  . Heart failure Mother   . Heart failure Father   . Diabetes Father   . Heart failure Maternal Grandfather     Social History   Social History  . Marital status: Married    Spouse name: Winferd Humphrey  . Number of children: 0  . Years of education: N/A   Occupational History  . College advisor    Social History Main Topics  . Smoking status: Current Every Day Smoker    Packs/day: 0.25    Years: 5.00    Types: Cigarettes    Last attempt to quit: 04/08/2012  . Smokeless tobacco: Never Used     Comment: Pt states that she is trying to quit. Is currently wearing nicotine patch.  . Alcohol use No  . Drug use: No  . Sexual activity: Yes    Birth control/ protection: None   Other Topics Concern  . Not on file   Social History Narrative   Married.    Review of systems: Review of Systems  Constitutional: Negative for fever and chills.  HENT: Negative.   Eyes: Negative for blurred vision.  Respiratory: as per HPI  Cardiovascular: Negative for chest pain and palpitations.  Gastrointestinal:  Negative for vomiting, diarrhea, blood per rectum. Genitourinary: Negative for dysuria, urgency, frequency and hematuria.  Musculoskeletal: Negative for myalgias, back pain and joint pain.  Skin: Negative for itching and rash.  Neurological: Negative for dizziness, tremors, focal weakness, seizures and loss of consciousness.  Endo/Heme/Allergies: Negative for environmental allergies.  Psychiatric/Behavioral: Negative for depression, suicidal ideas and hallucinations.  All other systems reviewed and are negative.  Physical Exam: Blood pressure 122/74, pulse 86, height _0  (1.626 m), weight 119.1 kg (262 lb 9.6 oz), last menstrual period 10/16/2016, SpO2 91 %. Gen:      No acute distress HEENT:  EOMI, sclera anicteric Neck:     No masses; no thyromegaly Lungs:    Clear to auscultation bilaterally; normal respiratory effort CV:         Regular rate and rhythm; no  murmurs Abd:      + bowel sounds; soft, non-tender; no palpable masses, no distension Ext:    No edema; adequate peripheral perfusion Skin:      Warm and dry; no rash Neuro: alert and oriented x 3 Psych: normal mood and affect  Data Reviewed: CT scan- 06/13/16- diffuse interstitial groundglass opacities, mild mediastinal adenopathy CXR 08/01/16- bilateral interstitial opacities High res CT 10/06/16- no evidence of interstitial lung disease, improvement in groundglass opacities with mild residual opacity in the left upper lobe. Left upper lobe 0.5 centimeter groundglass opacity.  I reviewed all images personally.  FENO 08/06/16-13  Serologies ANA, ANCA, RA negative HSP panel 08/27/16- negative  Sp cx 5/15- Candida [employer], regular cultures are negative  IgE 08/27/16- 902  PFT 09/22/16 FVC 2.89 (74%) FEV1 2.36 (72%) F/F 82 TLC 84% DLCO 87% Minimal obstructive, small airways disease  Bronch 10/08/16 Micro, path is negative. Flow cytometry not done Cell count WBC 485, L 22%, Eos 20%, neuts 31%, Mon macrophage 27%  Assessment:   Severe persistent asthma Eosinophilic PNA She has had multiple admissions for respiratory failure with infiltrates. Workup for interstitial lung disease, hypersensitivity panel is negative.  Differential diagnosis are uncontrolled asthma, eosinophilic pneumonias. Her BAL shows high eosinophil count. Although the 20% in BAL does not reach the formal criteria for eosinophilic pneumonia, we can still give her the diagnosis as she got off prednisone just about 5 days before the procedure.   She has not been able to get off prednisone for a prolonged period of time and hopefully the Nucala will help by getting eosinophilic inflammation under control. Today in the office she still congested and wheezy. I'll get a chest x-ray to make sure the infiltrates are not returning. We will call in another round of doxycycline and a prednisone taper and stop Symbicort, start Breo and incruse for better control of symptoms.  Active smoker I don't belive that she has specific smoking related ILD as it would be unusual to have high eosinophils however the continued smoking is likely contributing to her symptoms. Reiterated the importance of quitting smoking completely. Time spent counseling 5 mins.   Plan/Recommendations: - Stop symbicort. Start breo and incruse - Doxy and prednisone taper - Smoking cessation. Nicotine patches.   Return to clinic in 1-2 months.  Marshell Garfinkel MD [facility] Pager [phone] 11/15/2016, 11:32 AM  CC: Lawerance Cruel, MD

## 2016-11-22 LAB — ACID FAST CULTURE WITH REFLEXED SENSITIVITIES (MYCOBACTERIA): Acid Fast Culture: NEGATIVE

## 2016-11-22 LAB — ACID FAST CULTURE WITH REFLEXED SENSITIVITIES

## 2016-11-29 ENCOUNTER — Telehealth: Payer: Self-pay | Admitting: Pulmonary Disease

## 2016-11-29 MED ORDER — PREDNISONE 20 MG PO TABS: 20.0000 mg | ORAL_TABLET | Freq: Every day | ORAL | 1 refills | Status: DC

## 2016-11-29 NOTE — Telephone Encounter (Signed)
Spoke with pt. She is aware of PM's recommendation. Rx has been sent in. Pt has a follow up with PM in September. Nothing further was needed.

## 2016-11-29 NOTE — Telephone Encounter (Signed)
Spoke with pt. States that she is not feeling well. Reports chest tightness, SOB, wheezing and coughing. Cough is producing clear mucus. Denies fever, chills, sweats. Pt finished prednisone and then her symptoms came back. She was offered an appointment but declined due to her work schedule. Pt would like to have more medication sent in.  PM - please advise. Thanks.

## 2016-11-29 NOTE — Telephone Encounter (Signed)
Start prednisone 20 mg/day. Continue at that dose till she is seen back in clinic.  Chilton GreathousePraveen Diem Dicocco MD [facility] 11/29/2016, 2:54 PM

## 2016-12-07 ENCOUNTER — Ambulatory Visit (INDEPENDENT_AMBULATORY_CARE_PROVIDER_SITE_OTHER): Payer: BC Managed Care – PPO | Admitting: *Deleted

## 2016-12-07 DIAGNOSIS — J4550 Severe persistent asthma, uncomplicated: Secondary | ICD-10-CM

## 2016-12-21 ENCOUNTER — Ambulatory Visit (INDEPENDENT_AMBULATORY_CARE_PROVIDER_SITE_OTHER): Payer: BC Managed Care – PPO | Admitting: Pulmonary Disease

## 2016-12-21 ENCOUNTER — Encounter: Payer: Self-pay | Admitting: Pulmonary Disease

## 2016-12-21 VITALS — BP 124/74 | HR 87 | Ht 64.0 in | Wt 269.2 lb

## 2016-12-21 DIAGNOSIS — J45901 Unspecified asthma with (acute) exacerbation: Secondary | ICD-10-CM

## 2016-12-21 DIAGNOSIS — Z23 Encounter for immunization: Secondary | ICD-10-CM

## 2016-12-21 MED ORDER — PREDNISONE 5 MG PO TABS: 5.0000 mg | ORAL_TABLET | Freq: Every day | ORAL | 3 refills | Status: DC

## 2016-12-21 NOTE — Progress Notes (Signed)
Kara [name]    [id number]    February 06, 1986  Primary Care Physician:Ross, Dwyane Luo, MD  Referring Physician: Lawerance Cruel, MD [address] [place], [place] [address]  Chief complaint:  Follow up for  Asthma. On Nucala since July 95AO, 1308 Eosinophilic pneumonia  HPI: Kara [name] is a 31 year old with medical history of asthma, alcohol abuse, pancreatitis. She was diagnosed with asthma about 15 years ago and has been tested for allergies before and reports sensitivity to cats, dogs, pollen, mold and multiple other allergens. She has seasonal allergies, postnasal drip and acid reflux.  She has been hospitalized at [facility] in February and April 2018 for recurrent pneumonia treated with antibiotics, steroids, nebulizers. All cultures were negative. She was negative for flu and viral infections or the RVP.  Readmitted in May 2018 with recurrent respiratory failure. Noted to have elevated eosinophils on admission. She was treated with antibiotics and discharged on a prolonged taper. She has been evaluated at the [facility] by Dr. Neldon Mc for [place].  She underwent a bronchoscopy on 10/08/16 which showed an elevated eosinophils in the BAL. Path was negative for malignancy and flow cytometry for CD4 CD8 ratio could not be performed due to insufficient cells. She has started on new nucala.  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 Exposures: No known exposures at work or at home, no mold issues Smoking history: Smokes a pack a day for past 5 years. Last alcohol use was in December 2017.  Interim History: Started on prednisone at 20 mg. She reports Improvement in symptoms while on prednisone. Denies any cough, dyspnea and is on nucala injections  Outpatient Encounter Prescriptions as of 12/21/2016  Medication Sig  . albuterol (PROVENTIL) (2.5 MG/3ML) 0.083% nebulizer solution INHALE THE CONTENTS OF 1VIAL VIA NEBULIZER Q 4-6 H PRN  .  chlorhexidine (PERIDEX) 0.12 % solution Use as directed 15 mLs in the mouth or throat 2 (two) times daily. After meals  . Cholecalciferol (VITAMIN D PO) Take 1 tablet by mouth daily.   Marland Kitchen EPINEPHrine (EPIPEN 2-PAK) 0.3 mg/0.3 mL IJ SOAJ injection Inject 0.3 mg into the muscle once as needed (for severe allergic reaction).  . fluticasone furoate-vilanterol (BREO ELLIPTA) 200-25 MCG/INH AEPB Inhale 1 puff into the lungs daily.  . furosemide (LASIX) 20 MG tablet Take 1 tablet (20 mg total) by mouth daily.  Marland Kitchen gabapentin (NEURONTIN) 300 MG capsule Take 3 capsules (900 mg total) by mouth 2 (two) times daily.  Marland Kitchen ipratropium-albuterol (DUONEB) 0.5-2.5 (3) MG/3ML SOLN USE 1 VIAL PER NEBULIZER EVERY 6 HOURS  FOR 30 DAYS  . Melatonin 10 MG TABS Take 10 mg by mouth at bedtime.  . meloxicam (MOBIC) 15 MG tablet Take 1 tablet (15 mg total) by mouth daily as needed for pain.  . metoprolol succinate (TOPROL-XL) 50 MG 24 hr tablet Take 1 tablet (50 mg total) by mouth daily. Take with or immediately following a meal.  . montelukast (SINGULAIR) 10 MG tablet Take 1 tablet (10 mg total) by mouth daily.  . Multiple Vitamins-Minerals (MULTIVITAMIN WITH MINERALS) tablet Take 1 tablet by mouth daily.  . naltrexone (DEPADE) 50 MG tablet Take 50 mg by mouth daily.  . nicotine (NICODERM CQ - DOSED IN MG/24 HOURS) 21 mg/24hr patch Place 1 patch (21 mg total) onto the skin daily.  Marland Kitchen omeprazole (PRILOSEC) 40 MG capsule TAKE 1 CAPSULE(40 MG) BY MOUTH DAILY  . predniSONE (DELTASONE) 20 MG  tablet Take 1 tablet (20 mg total) by mouth daily with breakfast.  . PROAIR HFA 108 (90 Base) MCG/ACT inhaler INHALE 2 PUFFS INTO THE LUNGS EVERY 4 HOURS AS NEEDED FOR WHEEZING OR SHORTNESS OF BREATH  . QUEtiapine (SEROQUEL) 50 MG tablet Take 100 mg by mouth at bedtime.   . sertraline (ZOLOFT) 50 MG tablet Take 75 mg by mouth daily.  Marland Kitchen thiamine 100 MG tablet Take 100 mg by mouth daily.  Marland Kitchen umeclidinium bromide (INCRUSE ELLIPTA) 62.5 MCG/INH AEPB  Inhale 1 puff into the lungs daily.  . [DISCONTINUED] doxycycline (VIBRA-TABS) 100 MG tablet Take 1 tablet (100 mg total) by mouth 2 (two) times daily.   Facility-Administered Encounter Medications as of 12/21/2016  Medication  . Mepolizumab SOLR 100 mg    Allergies as of 12/21/2016 - Review Complete 12/21/2016  Allergen Reaction Noted  . Peanuts [peanut oil] Hives and Itching 07/06/2014    Past Medical History:  Diagnosis Date  . Alcohol abuse   . Asthma   . Environmental allergies   . GERD (gastroesophageal reflux disease)   . Headache   . Hypertension   . Pancreatitis   . Prolonged QT syndrome   . Syncope and collapse   . Vision abnormalities     Past Surgical History:  Procedure Laterality Date  . ESOPHAGOGASTRODUODENOSCOPY N/A 07/17/2014   Procedure: ESOPHAGOGASTRODUODENOSCOPY (EGD);  Surgeon: Teena Irani, MD;  Location: [facility];  Service: Endoscopy;  Laterality: N/A;  . TYMPANOSTOMY TUBE PLACEMENT    . VIDEO BRONCHOSCOPY Bilateral 10/08/2016   Procedure: VIDEO BRONCHOSCOPY WITH FLUORO;  Surgeon: Marshell Garfinkel, MD;  Location: [facility];  Service: Cardiopulmonary;  Laterality: Bilateral;    Family History  Problem Relation Age of Onset  . Heart failure Mother   . Heart failure Father   . Diabetes Father   . Heart failure Maternal Grandfather     Social History   Social History  . Marital status: Married    Spouse name: Kara [name]  . Number of children: 0  . Years of education: N/A   Occupational History  . College advisor    Social History Main Topics  . Smoking status: Current Every Day Smoker    Packs/day: 0.50    Years: 5.00    Types: Cigarettes    Last attempt to quit: 04/08/2012  . Smokeless tobacco: Never Used     Comment: Pt states that she is trying to quit. Is currently wearing nicotine patch.  . Alcohol use No  . Drug use: No  . Sexual activity: Yes    Birth control/ protection: None   Other Topics Concern  . Not on file    Social History Narrative   Married.    Review of systems: Review of Systems  Constitutional: Negative for fever and chills.  HENT: Negative.   Eyes: Negative for blurred vision.  Respiratory: as per HPI  Cardiovascular: Negative for chest pain and palpitations.  Gastrointestinal: Negative for vomiting, diarrhea, blood per rectum. Genitourinary: Negative for dysuria, urgency, frequency and hematuria.  Musculoskeletal: Negative for myalgias, back pain and joint pain.  Skin: Negative for itching and rash.  Neurological: Negative for dizziness, tremors, focal weakness, seizures and loss of consciousness.  Endo/Heme/Allergies: Negative for environmental allergies.  Psychiatric/Behavioral: Negative for depression, suicidal ideas and hallucinations.  All other systems reviewed and are negative.  Physical Exam: Blood pressure 124/74, pulse 87, height _0  (1.626 m), weight 269 lb 3.2 oz (122.1 kg), SpO2 96 %. Gen:  No acute distress HEENT:  EOMI, sclera anicteric Neck:     No masses; no thyromegaly Lungs:    Scattered exp wheeze; normal respiratory effort CV:         Regular rate and rhythm; no murmurs Abd:      + bowel sounds; soft, non-tender; no palpable masses, no distension Ext:    No edema; adequate peripheral perfusion Skin:      Warm and dry; no rash Neuro: alert and oriented x 3 Psych: normal mood and affect  Data Reviewed: CT scan- 06/13/16- diffuse interstitial groundglass opacities, mild mediastinal adenopathy CXR 08/01/16- bilateral interstitial opacities High res CT 10/06/16- no evidence of interstitial lung disease, improvement in groundglass opacities with mild residual opacity in the left upper lobe. Left upper lobe 0.5 centimeter groundglass opacity.  I reviewed all images personally.  FENO 08/06/16-13  Serologies ANA, ANCA, RA negative HSP panel 08/27/16- negative  Sp cx 5/15- Candida [employer], regular cultures are negative  IgE 08/27/16- 902  PFT 09/22/16 FVC  2.89 (74%) FEV1 2.36 (72%) F/F 82 TLC 84% DLCO 87% Minimal obstructive, small airways disease  Bronch 10/08/16 Micro, path is negative. Flow cytometry not done Cell count WBC 485, L 22%, Eos 20%, neuts 31%, Mon macrophage 27%  Assessment:  Severe persistent asthma Eosinophilic PNA She has had multiple admissions for respiratory failure with infiltrates. Workup for interstitial lung disease, hypersensitivity panel is negative.  Differential diagnosis are uncontrolled asthma, eosinophilic pneumonias. Her BAL shows high eosinophil count. Although the 20% in BAL does not reach the formal criteria for eosinophilic pneumonia, we can still give her the diagnosis as she got off prednisone just about 5 days before the procedure.   She has not been able to get off prednisone for a prolonged period of time and hopefully the Nucala will help by getting eosinophilic inflammation under control. I will reduce prednsione to 15 mg today. She will require a slow taper.  Active smoker I don't belive that she has specific smoking related ILD as it would be unusual to have high eosinophils however the continued smoking is likely contributing to her symptoms. Reiterated the importance of quitting smoking completely. Time spent counseling 5 mins.   Plan/Recommendations: - Continue breo and incruse - Reduce pred to 15 mg - Continue nucala - Smoking cessation. Nicotine patches.   Return to clinic in 1-2 months.  Marshell Garfinkel MD [facility] Pager [phone] 12/21/2016, 3:51 PM  CC: Lawerance Cruel, MD

## 2016-12-21 NOTE — Addendum Note (Signed)
Addended by: Wyvonne LenzPINION, Larisha Vencill P on: 12/21/2016 04:03 PM   Modules accepted: Orders

## 2016-12-21 NOTE — Patient Instructions (Signed)
We'll reduce prednisone to 15 mg per day. Will call in prescription for 5 mg tablets of prednisone Continue inhalers and Nucala infusion Please continue to work on smoking cessation   Follow-up in 1-2 months.

## 2016-12-26 ENCOUNTER — Other Ambulatory Visit: Payer: Self-pay | Admitting: Allergy and Immunology

## 2017-01-04 ENCOUNTER — Ambulatory Visit (INDEPENDENT_AMBULATORY_CARE_PROVIDER_SITE_OTHER): Payer: BC Managed Care – PPO

## 2017-01-04 DIAGNOSIS — J4550 Severe persistent asthma, uncomplicated: Secondary | ICD-10-CM

## 2017-01-11 ENCOUNTER — Telehealth: Payer: Self-pay | Admitting: Pulmonary Disease

## 2017-01-11 NOTE — Telephone Encounter (Signed)
Called pharmacy and waited on hold for 10 minutes. Left message for pt to call back.

## 2017-01-12 NOTE — Telephone Encounter (Signed)
Pt calling Leigh back from this morning.  [phone]

## 2017-01-12 NOTE — Telephone Encounter (Signed)
lmomtcb x 2 for the pt.  

## 2017-01-13 NOTE — Telephone Encounter (Signed)
I have spoken with [facility], who wanted to confirm that pt does not take Symbicort. [facility] states that Symbicort has been prescribed previously by another provider and was on auto fill. Per last 12/21/16 OV note pt is to continue Breo and incruse. Pt confirmed that she does not take Symbicort. Nothing further needed.

## 2017-01-24 ENCOUNTER — Telehealth: Payer: Self-pay | Admitting: Pulmonary Disease

## 2017-01-24 NOTE — Telephone Encounter (Signed)
Instructions   We'll reduce prednisone to 15 mg per day. Will call in prescription for 5 mg tablets of prednisone Continue inhalers and Nucala infusion Please continue to work on smoking cessation   Follow-up in 1-2 months.      She was changed to 15 mg so I guess she takes half of a 10 mg and one 5 mg. Called [facility] to let them know why it was sent in with 2 Rx's. Nothing further is needed.

## 2017-01-28 ENCOUNTER — Other Ambulatory Visit: Payer: Self-pay | Admitting: Allergy and Immunology

## 2017-02-01 ENCOUNTER — Ambulatory Visit: Payer: Self-pay

## 2017-02-03 ENCOUNTER — Ambulatory Visit (INDEPENDENT_AMBULATORY_CARE_PROVIDER_SITE_OTHER): Payer: BC Managed Care – PPO | Admitting: *Deleted

## 2017-02-03 DIAGNOSIS — J4550 Severe persistent asthma, uncomplicated: Secondary | ICD-10-CM

## 2017-02-07 ENCOUNTER — Telehealth: Payer: Self-pay | Admitting: Pulmonary Disease

## 2017-02-07 NOTE — Telephone Encounter (Signed)
Please have her take pred 15mg  daily, confirm that this is her dose. Have her follow with dr Isaiah Sergemannam to discuss any further decrease, weaning.

## 2017-02-07 NOTE — Telephone Encounter (Signed)
Called pt and advised message from the provider. Pt understood and verbalized understanding. Nothing further is needed.    

## 2017-02-07 NOTE — Telephone Encounter (Signed)
Spoke with patient. She stated that she since she has decreased her prednisone to 10mg  daily, her symptoms have become worse. Chest tightness, productive cough with yellow phlegm and wheezing for the past week. Sometimes the cough will wake up her up at night and she will feel like she is choking.   She wants to know if she needs to increase the prednisone.   Patient wishes to use [facility] on [address]   RB, please advise since PM is not in the office today. Thanks!

## 2017-02-09 ENCOUNTER — Telehealth: Payer: Self-pay | Admitting: Pulmonary Disease

## 2017-02-09 MED ORDER — DOXYCYCLINE HYCLATE 100 MG PO TABS: 100.0000 mg | ORAL_TABLET | Freq: Two times a day (BID) | ORAL | 0 refills | Status: DC

## 2017-02-09 NOTE — Telephone Encounter (Signed)
Called and spoke with pt and she stated that she called on Monday for coughing and we advised her by the DOD to increase her prednisone to 15 mg daily.  She stated that this has not helped her at all.  She still has a cough with yellow sputum and her chest is tight and feels heavy like it does when she gets PNA.  She is wanting further recs from PM.  Please advise. Thanks  Allergies  Allergen Reactions  . Peanuts [Peanut Oil] Hives and Itching

## 2017-02-09 NOTE — Telephone Encounter (Signed)
Ok to call in z pack. Increase prednisone to 15 mg.  Get CXR before clinic visit with me.  Chilton GreathousePraveen Maecie Sevcik MD [facility] 02/09/2017, 5:04 PM

## 2017-02-09 NOTE — Telephone Encounter (Signed)
Azithromycin interacts with seroquel- possible QT prolongation.  Spoke with PM who changed zpak to doxycycline 100mg  bid X7 days. Pt aware.  rx sent to preferred pharmacy.  Nothing further needed.

## 2017-02-14 ENCOUNTER — Telehealth: Payer: Self-pay | Admitting: Pulmonary Disease

## 2017-02-14 MED ORDER — PREDNISONE 5 MG PO TABS: 5.0000 mg | ORAL_TABLET | Freq: Every day | ORAL | 3 refills | Status: DC

## 2017-02-14 MED ORDER — PREDNISONE 20 MG PO TABS: 10.0000 mg | ORAL_TABLET | Freq: Every day | ORAL | 1 refills | Status: DC

## 2017-02-23 ENCOUNTER — Encounter: Payer: Self-pay | Admitting: Pulmonary Disease

## 2017-02-23 ENCOUNTER — Ambulatory Visit (INDEPENDENT_AMBULATORY_CARE_PROVIDER_SITE_OTHER): Payer: BC Managed Care – PPO | Admitting: Pulmonary Disease

## 2017-02-23 VITALS — BP 138/78 | HR 65 | Ht 65.0 in | Wt 281.0 lb

## 2017-02-23 DIAGNOSIS — J849 Interstitial pulmonary disease, unspecified: Secondary | ICD-10-CM | POA: Diagnosis not present

## 2017-02-23 DIAGNOSIS — J4551 Severe persistent asthma with (acute) exacerbation: Secondary | ICD-10-CM | POA: Diagnosis not present

## 2017-02-23 NOTE — Patient Instructions (Addendum)
Reduce prednisone to 12.5 mg Continue with nucala injections Please work on smoking cessation. We will give prescription for calcium and vitamin D Follow-up in 1 month

## 2017-02-23 NOTE — Progress Notes (Signed)
Kara [name]    [id number]    November 22, 1985  Primary Care Physician:Ross, Dwyane Luo, MD  Referring Physician: Lawerance Cruel, MD [address] [place], [place] [address]  Chief complaint:  Follow up for  Asthma. 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 She was diagnosed with asthma about 15 years ago and has been tested for allergies before and reports sensitivity to cats, dogs, pollen, mold and multiple other allergens. She has seasonal allergies, postnasal drip and acid reflux.  She has been hospitalized at [facility] in February and April 2018 for recurrent pneumonia treated with antibiotics, steroids, nebulizers. All cultures were negative. She was negative for flu and viral infections or the RVP.  Readmitted in May 2018 with recurrent respiratory failure. Noted to have elevated eosinophils on admission. She was treated with antibiotics and discharged on a prolonged taper. She has been evaluated at the [facility] by Dr. Neldon Mc for [place].  She underwent a bronchoscopy on 10/08/16 which showed an elevated eosinophils in the BAL. Path was negative for malignancy and flow cytometry for CD4 CD8 ratio could not be performed due to insufficient cells. She has started on new nucala.  Pets: No pets Occupation: Works as a Engineer, materials at [employer]: No known exposures at work or at home, no mold issues Smoking history: Smokes a pack a day for past 5 years. Last alcohol use was in December 2017.  Interim History: Symptoms worsen and prednisone was reduced to 10 mg.  She is now back at 15 mg prednisone.  She required a course of doxycycline for bronchitis last month.  Today in office she feels improved with no new complaints She recently changed her job to [employer].  There are some minor issues with mold but this has not made her breathing  worse.  Outpatient Encounter Medications as of 02/23/2017  Medication Sig  . albuterol (PROVENTIL) (2.5 MG/3ML) 0.083% nebulizer solution INHALE THE CONTENTS OF 1VIAL VIA NEBULIZER Q 4-6 H PRN  . chlorhexidine (PERIDEX) 0.12 % solution Use as directed 15 mLs in the mouth or throat 2 (two) times daily. After meals  . Cholecalciferol (VITAMIN D PO) Take 1 tablet by mouth daily.   Marland Kitchen EPINEPHrine (EPIPEN 2-PAK) 0.3 mg/0.3 mL IJ SOAJ injection Inject 0.3 mg into the muscle once as needed (for severe allergic reaction).  . fluticasone furoate-vilanterol (BREO ELLIPTA) 200-25 MCG/INH AEPB Inhale 1 puff into the lungs daily.  . furosemide (LASIX) 20 MG tablet Take 1 tablet (20 mg total) by mouth daily.  Marland Kitchen gabapentin (NEURONTIN) 300 MG capsule Take 3 capsules (900 mg total) by mouth 2 (two) times daily.  Marland Kitchen ipratropium-albuterol (DUONEB) 0.5-2.5 (3) MG/3ML SOLN USE 1 VIAL PER NEBULIZER EVERY 6 HOURS  FOR 30 DAYS  . Melatonin 10 MG TABS Take 10 mg by mouth at bedtime.  . meloxicam (MOBIC) 15 MG tablet Take 1 tablet (15 mg total) by mouth daily as needed for pain.  . montelukast (SINGULAIR) 10 MG tablet Take 1 tablet (10 mg total) by mouth daily.  . Multiple Vitamins-Minerals (MULTIVITAMIN WITH MINERALS) tablet Take 1 tablet by mouth daily.  . naltrexone (DEPADE) 50 MG tablet Take 50 mg by mouth daily.  . nicotine (NICODERM CQ - DOSED IN MG/24 HOURS) 21 mg/24hr patch Place 1 patch (21 mg total) onto the skin daily.  Marland Kitchen omeprazole (PRILOSEC) 40  MG capsule TAKE 1 CAPSULE(40 MG) BY MOUTH DAILY  . predniSONE (DELTASONE) 20 MG tablet Take 0.5 tablets (10 mg total) by mouth daily with breakfast.  . predniSONE (DELTASONE) 5 MG tablet Take 1 tablet (5 mg total) by mouth daily with breakfast.  . PROAIR HFA 108 (90 Base) MCG/ACT inhaler INHALE 2 PUFFS INTO THE LUNGS EVERY 4 HOURS AS NEEDED FOR WHEEZING OR SHORTNESS OF BREATH  . PROAIR HFA 108 (90 Base) MCG/ACT inhaler INHALE 2 PUFFS INTO THE LUNGS EVERY 6 HOURS AS  NEEDED FOR WHEEZING OR SHORTNESS OF BREATH  . QUEtiapine (SEROQUEL) 50 MG tablet Take 100 mg by mouth at bedtime.   . sertraline (ZOLOFT) 50 MG tablet Take 75 mg by mouth daily.  Marland Kitchen thiamine 100 MG tablet Take 100 mg by mouth daily.  Marland Kitchen umeclidinium bromide (INCRUSE ELLIPTA) 62.5 MCG/INH AEPB Inhale 1 puff into the lungs daily.  . metoprolol succinate (TOPROL-XL) 50 MG 24 hr tablet Take 1 tablet (50 mg total) by mouth daily. Take with or immediately following a meal.  . [DISCONTINUED] doxycycline (VIBRA-TABS) 100 MG tablet Take 1 tablet (100 mg total) by mouth 2 (two) times daily.   Facility-Administered Encounter Medications as of 02/23/2017  Medication  . Mepolizumab SOLR 100 mg    Allergies as of 02/23/2017 - Review Complete 02/23/2017  Allergen Reaction Noted  . Peanuts [peanut oil] Hives and Itching 07/06/2014    Past Medical History:  Diagnosis Date  . Alcohol abuse   . Asthma   . Environmental allergies   . GERD (gastroesophageal reflux disease)   . Headache   . Hypertension   . Pancreatitis   . Prolonged QT syndrome   . Syncope and collapse   . Vision abnormalities     Past Surgical History:  Procedure Laterality Date  . TYMPANOSTOMY TUBE PLACEMENT      Family History  Problem Relation Age of Onset  . Heart failure Mother   . Heart failure Father   . Diabetes Father   . Heart failure Maternal Grandfather     Social History   Socioeconomic History  . Marital status: Married    Spouse name: Winferd Humphrey  . Number of children: 0  . Years of education: Not on file  . Highest education level: Not on file  Social Needs  . Financial resource strain: Not on file  . Food insecurity - worry: Not on file  . Food insecurity - inability: Not on file  . Transportation needs - medical: Not on file  . Transportation needs - non-medical: Not on file  Occupational History  . Occupation: Investment banker, corporate  Tobacco Use  . Smoking status: Current Every Day Smoker     Packs/day: 0.50    Years: 5.00    Pack years: 2.50    Types: Cigarettes    Last attempt to quit: 04/08/2012    Years since quitting: 4.8  . Smokeless tobacco: Never Used  . Tobacco comment: Pt states that she is trying to quit. Is currently wearing nicotine patch.  Substance and Sexual Activity  . Alcohol use: No    Alcohol/week: 1.8 oz    Types: 3 Shots of liquor per week  . Drug use: No  . Sexual activity: Yes    Birth control/protection: None  Other Topics Concern  . Not on file  Social History Narrative   Married.    Review of systems: Review of Systems  Constitutional: Negative for fever and chills.  HENT: Negative.   Eyes: Negative  for blurred vision.  Respiratory: as per HPI  Cardiovascular: Negative for chest pain and palpitations.  Gastrointestinal: Negative for vomiting, diarrhea, blood per rectum. Genitourinary: Negative for dysuria, urgency, frequency and hematuria.  Musculoskeletal: Negative for myalgias, back pain and joint pain.  Skin: Negative for itching and rash.  Neurological: Negative for dizziness, tremors, focal weakness, seizures and loss of consciousness.  Endo/Heme/Allergies: Negative for environmental allergies.  Psychiatric/Behavioral: Negative for depression, suicidal ideas and hallucinations.  All other systems reviewed and are negative.  Physical Exam: Blood pressure 138/78, pulse 65, height _0  (1.651 m), weight 281 lb (127.5 kg), SpO2 93 %. Gen:      No acute distress HEENT:  EOMI, sclera anicteric Neck:     No masses; no thyromegaly Lungs:    Clear to auscultation bilaterally; normal respiratory effort CV:         Regular rate and rhythm; no murmurs Abd:      + bowel sounds; soft, non-tender; no palpable masses, no distension Ext:    No edema; adequate peripheral perfusion Skin:      Warm and dry; no rash Neuro: alert and oriented x 3 Psych: normal mood and affect  Data Reviewed: CT scan- 06/13/16- diffuse interstitial groundglass  opacities, mild mediastinal adenopathy CXR 08/01/16- bilateral interstitial opacities High res CT 10/06/16- no evidence of interstitial lung disease, improvement in groundglass opacities with mild residual opacity in the left upper lobe. Left upper lobe 0.5 centimeter groundglass opacity.  Chest x-ray 11/15/16-no acute cardiopulmonary abnormality. I reviewed all images personally.  FENO 08/06/16-13  Serologies ANA, ANCA, RA negative HSP panel 08/27/16- negative  Sp cx 5/15- [employer], regular cultures are negative  IgE 08/27/16- 902  PFT 09/22/16 FVC 2.89 (74%), FEV1 2.36 (72%), F/F 82 ,TLC 84%, DLCO 87% Minimal obstructive, small airways disease  Bronch 10/08/16 Micro, path is negative. Flow cytometry not done Cell count WBC 485, L 22%, Eos 20%, neuts 31%, Mon macrophage 27%  Assessment:  Severe persistent asthma Eosinophilic PNA Workup for interstitial lung disease, hypersensitivity panel is negative.  Differential diagnosis are uncontrolled asthma, eosinophilic pneumonias. Her BAL shows high eosinophil count. Although the 20% in BAL does not reach the formal criteria for eosinophilic pneumonia, we can still give her the diagnosis as she got off prednisone just about 5 days before the procedure.   She had a setback last month with increase in prednisone back to 15 mg.  Will resume her slow taper and reduce to 12.5 mg today.  Add calcium and vitamin D supplementation She continues on nucala per Dr. Neldon Mc  Active smoker She continues to smoke half pack per day which is contributing to her symptoms Reiterated that she needs to quit as soon as possible.  Time spent counseling-5 minutes  Plan/Recommendations: - Continue breo and incruse - Reduce prednisone to 12.5 mg - Calcium, vitamin D supplementation - Continue nucala - Smoking cessation  Return to clinic in 1 month.  Marshell Garfinkel MD [facility] Pager [phone] 02/23/2017, 4:46 PM  CC: Lawerance Cruel, MD

## 2017-02-25 ENCOUNTER — Telehealth: Payer: Self-pay | Admitting: Pulmonary Disease

## 2017-02-25 NOTE — Telephone Encounter (Signed)
atc pt X2, line rang several times with no answer.  wcb.

## 2017-02-28 NOTE — Telephone Encounter (Signed)
Attempted to contact pt. No answer, no option to leave a message. Will try back.  

## 2017-03-01 NOTE — Telephone Encounter (Signed)
atc pt X3, no answer and no vm set up.  Will close encounter per triage protocol.

## 2017-03-03 ENCOUNTER — Ambulatory Visit (INDEPENDENT_AMBULATORY_CARE_PROVIDER_SITE_OTHER): Payer: BC Managed Care – PPO | Admitting: *Deleted

## 2017-03-03 DIAGNOSIS — J4550 Severe persistent asthma, uncomplicated: Secondary | ICD-10-CM | POA: Diagnosis not present

## 2017-03-11 ENCOUNTER — Other Ambulatory Visit: Payer: Self-pay | Admitting: Allergy and Immunology

## 2017-03-21 ENCOUNTER — Other Ambulatory Visit: Payer: Self-pay | Admitting: Allergy and Immunology

## 2017-03-25 ENCOUNTER — Ambulatory Visit: Payer: BC Managed Care – PPO | Admitting: Pulmonary Disease

## 2017-03-28 ENCOUNTER — Ambulatory Visit: Payer: BC Managed Care – PPO | Admitting: Pulmonary Disease

## 2017-03-31 ENCOUNTER — Ambulatory Visit (INDEPENDENT_AMBULATORY_CARE_PROVIDER_SITE_OTHER): Payer: BC Managed Care – PPO | Admitting: *Deleted

## 2017-03-31 DIAGNOSIS — J4550 Severe persistent asthma, uncomplicated: Secondary | ICD-10-CM | POA: Diagnosis not present

## 2017-03-31 NOTE — Progress Notes (Signed)
Patient ID: Kara [name], female   DOB: 08/02/1985, 31 y.o.   MRN: [id number]     Cardiology Office Note   Date:  04/06/2017   ID:  Kara [name], DOB 02/27/1986, MRN [id number]  PCP:  Daisy Florooss, Charles Alan, MD  Cardiologist:   Charlton HawsPeter Nishan, MD   No chief complaint on file.     History of Present Illness: Kara Lewmanda Massing is a 31 y.o. female who presents for f/u  palpitations and syncope.  Seen in hospital 12/15  PMH of ETOH abuse and asthma who was brought to the hospital by her husband after having a syncopal episode after walking to the bathroom (had some nausea). She feels like she blacked out for a few seconds. No prodromal symptoms except for the sudden onset of nausea. Vomited after coming to the ED. No complaints of chest pains, shortness of breath or palpitations. The patient denies any recent medication changes, no recent similar symptoms. There is no family history of sudden death. She does report an episode of hematochezia over a week ago, and has not had any since that time. Upon initial evaluation in the ED, the patient's heart rate is 110-130 with a QTC greater than 600. Hemoglobin is normal at 13.8.  She handled he alcoholism and spent inpatient time at [facility]   Has been hospitalized for ? Eosinophilic pneumonia And is on prednisone now Has caused LE edema and swelling  History of CAD in Dad and ? Sister but no sudden death.  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 She underwent IR guided embolization of the IMA branches.  [facility] in June, 22, 2018  with eosinophils Continues on low dose steriods and inhalers HR improved on Toprol 50 mg daily . She is at  [employer] working now. Still sober over a year now    Past Medical History:  Diagnosis Date  . Alcohol abuse   . Asthma   . Environmental allergies   . GERD (gastroesophageal reflux disease)   . Headache   . Hypertension   . Pancreatitis   . Prolonged QT syndrome   . Syncope and collapse   . Vision abnormalities     Past Surgical History:  Procedure Laterality Date  . ESOPHAGOGASTRODUODENOSCOPY N/A 07/17/2014   Procedure: ESOPHAGOGASTRODUODENOSCOPY (EGD);  Surgeon: Dorena CookeyJohn Hayes, MD;  [facility];  Service: Endoscopy;  Laterality: N/A;  . TYMPANOSTOMY TUBE PLACEMENT    . VIDEO BRONCHOSCOPY Bilateral 10/08/2016   Procedure: VIDEO BRONCHOSCOPY WITH FLUORO;  Surgeon: Chilton GreathouseMannam, Praveen, MD;  Location: [facility];  Service: Cardiopulmonary;  Laterality: Bilateral;     Current Outpatient Medications  Medication Sig Dispense Refill  . albuterol (PROVENTIL) (2.5 MG/3ML) 0.083% nebulizer solution INHALE THE CONTENTS OF 1VIAL VIA NEBULIZER Q 4-6 H PRN  5  . chlorhexidine (PERIDEX) 0.12 % solution Use as directed 15 mLs in the mouth or throat 2 (two) times daily. After meals 2700 mL 1  . Cholecalciferol (VITAMIN D PO) Take 1 tablet by mouth daily.     Marland Kitchen. EPINEPHrine (EPIPEN 2-PAK) 0.3 mg/0.3 mL IJ SOAJ injection Inject 0.3 mg into the muscle once as needed (for severe allergic reaction).    . fluticasone furoate-vilanterol (BREO ELLIPTA) 200-25 MCG/INH AEPB Inhale 1 puff  into the lungs daily. 60 each 6  . furosemide (LASIX) 20 MG tablet Take 1 tablet (20 mg total) by mouth daily. 90 tablet 3  . gabapentin (NEURONTIN) 300 MG capsule Take 3 capsules (900 mg total) by mouth 2 (two) times daily. 540 capsule 4  . ipratropium-albuterol (DUONEB) 0.5-2.5 (3) MG/3ML SOLN USE 1 VIAL PER NEBULIZER EVERY 6 HOURS  FOR 30 DAYS  6  . Melatonin 10 MG TABS Take 10 mg by mouth at bedtime.    . meloxicam (MOBIC) 15 MG tablet Take 1 tablet (15 mg total) by mouth daily as needed for pain. 30 tablet 0  .  montelukast (SINGULAIR) 10 MG tablet TAKE 1 TABLET(10 MG) BY MOUTH DAILY 30 tablet 0  . Multiple Vitamins-Minerals (MULTIVITAMIN WITH MINERALS) tablet Take 1 tablet by mouth daily.    . naltrexone (DEPADE) 50 MG tablet Take 50 mg by mouth daily.    . nicotine (NICODERM CQ - DOSED IN MG/24 HOURS) 21 mg/24hr patch Place 1 patch (21 mg total) onto the skin daily. 90 patch 0  . omeprazole (PRILOSEC) 40 MG capsule TAKE 1 CAPSULE(40 MG) BY MOUTH DAILY 90 capsule 1  . predniSONE (DELTASONE) 20 MG tablet Take 0.5 tablets (10 mg total) by mouth daily with breakfast. 30 tablet 1  . predniSONE (DELTASONE) 5 MG tablet Take 1 tablet (5 mg total) by mouth daily with breakfast. 30 tablet 3  . PROAIR HFA 108 (90 Base) MCG/ACT inhaler INHALE 2 PUFFS INTO THE LUNGS EVERY 6 HOURS AS NEEDED FOR WHEEZING OR SHORTNESS OF BREATH 8.5 g 0  . QUEtiapine (SEROQUEL) 50 MG tablet Take 100 mg by mouth at bedtime.     . sertraline (ZOLOFT) 50 MG tablet Take 75 mg by mouth daily.    Marland Kitchen thiamine 100 MG tablet Take 100 mg by mouth daily.    Marland Kitchen umeclidinium bromide (INCRUSE ELLIPTA) 62.5 MCG/INH AEPB Inhale 1 puff into the lungs daily. 30 each 6  . metoprolol succinate (TOPROL-XL) 50 MG 24 hr tablet Take 1 tablet (50 mg total) by mouth daily. Take with or immediately following a meal. 90 tablet 3   Current Facility-Administered Medications  Medication Dose Route Frequency Provider Last Rate Last Dose  . Mepolizumab SOLR 100 mg  100 mg Subcutaneous Q28 days Jessica Priest, MD   100 mg at 03/31/17 1821    Allergies:   Peanuts [peanut oil]    Social History:  The patient  reports that she has been smoking cigarettes.  She has a 2.50 pack-year smoking history. she has never used smokeless tobacco. She reports that she does not drink alcohol or use drugs.   Family History:  The patient's family history includes Diabetes in her father; Heart failure in her father, maternal grandfather, and mother.    ROS:  Please see the history of  present illness.   Otherwise, review of systems are positive for none.   All other systems are reviewed and negative.    PHYSICAL EXAM: VS:  BP 110/70   Pulse (!) 110   Ht 5\' 5"  (1.651 m)   Wt 287 lb 8 oz (130.4 kg)   SpO2 96%   BMI 47.84 kg/m  , BMI Body mass index is 47.84 kg/m. Affect appropriate Overweight white female  HEENT: normal Neck supple with no adenopathy JVP normal no bruits no thyromegaly Lungs clear with no wheezing and good diaphragmatic motion Heart:  S1/S2 no murmur, no rub, gallop or click PMI normal Abdomen: benighn, BS positve,  no tenderness, no AAA no bruit.  No HSM or HJR Distal pulses intact with no bruits No edema Neuro non-focal Skin warm and dry No muscular weakness     EKG:  12/ 21/15  ST rate 131  QT 434   12/22 /15 SR rate 92  QT 412  Normal  07/28/14    Recent Labs: 06/12/2016: B Natriuretic Peptide 21.7 06/13/2016: TSH 0.219 09/01/2016: ALT 13; Hemoglobin 12.1; Magnesium 1.9; Platelets 227 10/15/2016: BUN 11; Creatinine, Ser 0.84; Potassium 4.3; Sodium 139    Lipid Panel    Component Value Date/Time   CHOL 174 09/09/2015 1724   TRIG 87 09/09/2015 1724   HDL 57 09/09/2015 1724   CHOLHDL 3.1 09/09/2015 1724   VLDL 17 09/09/2015 1724   LDLCALC 100 (H) 09/09/2015 1724      Wt Readings from Last 3 Encounters:  04/06/17 287 lb 8 oz (130.4 kg)  02/23/17 281 lb (127.5 kg)  12/21/16 269 lb 3.2 oz (122.1 kg)      Other studies Reviewed: Additional studies/ records that were reviewed today include: Hosptial admission records, ECG;s 12/15 . ST rate 123 QT 342  Bronch June 2018 Echo 06/13/16    ASSESSMENT AND PLAN:  1.  Syncope:  Etiology not clear. Normal ETT 04/23/15  and Normal EF by echo 06/13/16  Observe  2.  ETOH Abuse  Sober for over a year now   congratulated her  3.  Asthma/pneumonia:  F/u [facility] Dr Isaiah SergeMannam continue low dose steroids and inhalers 4.  Reflux:  Discussed low carb diet eating meals earlier and blocks  under bed 5.  Edema likely from weight and steroids PRN lasix  6.  Tachycardia: better on higher dose Toporl related to asthma/COPD and obesity  Current medicines are reviewed at length with the patient today.  The patient does not have concerns regarding medicines.  The following changes have been made:  no change  Labs/ tests ordered today include:     No orders of the defined types were placed in this encounter.    Disposition:   FU with me in a year     Signed, Charlton HawsPeter Nishan, MD  04/06/2017 8:14 AM    [facility] [address], [place], [place]  [address] Phone: [phone]) [phone]; Fax: [phone]) [phone]

## 2017-04-01 ENCOUNTER — Encounter: Payer: Self-pay | Admitting: Cardiovascular Disease

## 2017-04-02 ENCOUNTER — Other Ambulatory Visit: Payer: Self-pay | Admitting: Allergy and Immunology

## 2017-04-06 ENCOUNTER — Encounter: Payer: Self-pay | Admitting: Cardiovascular Disease

## 2017-04-06 ENCOUNTER — Ambulatory Visit (INDEPENDENT_AMBULATORY_CARE_PROVIDER_SITE_OTHER): Payer: BC Managed Care – PPO | Admitting: Cardiovascular Disease

## 2017-04-06 VITALS — BP 110/70 | HR 110 | Ht 65.0 in | Wt 287.5 lb

## 2017-04-06 DIAGNOSIS — I10 Essential (primary) hypertension: Secondary | ICD-10-CM

## 2017-04-06 DIAGNOSIS — Z09 Encounter for follow-up examination after completed treatment for conditions other than malignant neoplasm: Secondary | ICD-10-CM

## 2017-04-06 DIAGNOSIS — I5032 Chronic diastolic (congestive) heart failure: Secondary | ICD-10-CM

## 2017-04-06 NOTE — Patient Instructions (Addendum)

## 2017-04-09 ENCOUNTER — Other Ambulatory Visit: Payer: Self-pay | Admitting: Allergy and Immunology

## 2017-04-13 ENCOUNTER — Other Ambulatory Visit: Payer: Self-pay | Admitting: Allergy and Immunology

## 2017-04-17 ENCOUNTER — Telehealth: Payer: Self-pay | Admitting: Pulmonary Disease

## 2017-04-17 MED ORDER — PREDNISONE 10 MG PO TABS: ORAL_TABLET | ORAL | 0 refills | Status: DC

## 2017-04-17 MED ORDER — DOXYCYCLINE HYCLATE 100 MG PO TABS: 100.0000 mg | ORAL_TABLET | Freq: Two times a day (BID) | ORAL | 0 refills | Status: DC

## 2017-04-17 NOTE — Telephone Encounter (Signed)
She has more dyspnea, cough with yellow sputum, wheezing.  Feels like she does before she gets pneumonia.  Hasn't checked her temperature.  Denies sinus congestion, sore throat, nausea, vomiting, diarrhea, skin rash.  Using incruse, breo, singular, 12.5 prednisone.  Using nebulizer and this helps some.  Will send the following: 1) Prednisone 10 mg pill >> 4 pills daily for 2 days, 3 pills daily for 2 days, 2 pills daily for 2 days, then resume 12.5 mg prednisone daily 2) Doxycycline 100 mg bid for 7 days

## 2017-04-21 ENCOUNTER — Telehealth: Payer: Self-pay | Admitting: Allergy and Immunology

## 2017-04-21 NOTE — Telephone Encounter (Signed)
Pt called and needs to have Omeprazole 40 mg 90 day supply. [phone].

## 2017-04-22 MED ORDER — OMEPRAZOLE 40 MG PO CPDR: DELAYED_RELEASE_CAPSULE | ORAL | 0 refills | Status: DC

## 2017-04-22 NOTE — Telephone Encounter (Signed)
I called the patient and left a detailed message for her advising that I could only send in a 30 day supply until she is seen in the office.

## 2017-04-28 ENCOUNTER — Ambulatory Visit (INDEPENDENT_AMBULATORY_CARE_PROVIDER_SITE_OTHER): Payer: BC Managed Care – PPO | Admitting: *Deleted

## 2017-04-28 DIAGNOSIS — J4550 Severe persistent asthma, uncomplicated: Secondary | ICD-10-CM

## 2017-05-06 ENCOUNTER — Ambulatory Visit: Payer: BC Managed Care – PPO | Admitting: Pulmonary Disease

## 2017-05-07 ENCOUNTER — Other Ambulatory Visit: Payer: Self-pay | Admitting: Allergy and Immunology

## 2017-05-09 NOTE — Telephone Encounter (Signed)
Courtesy refill  

## 2017-05-18 ENCOUNTER — Other Ambulatory Visit: Payer: Self-pay | Admitting: Pulmonary Disease

## 2017-05-18 MED ORDER — UMECLIDINIUM BROMIDE 62.5 MCG/INH IN AEPB: 1.0000 | INHALATION_SPRAY | Freq: Every day | RESPIRATORY_TRACT | 6 refills | Status: DC

## 2017-05-18 MED ORDER — FLUTICASONE FUROATE-VILANTEROL 200-25 MCG/INH IN AEPB: 1.0000 | INHALATION_SPRAY | Freq: Every day | RESPIRATORY_TRACT | 6 refills | Status: DC

## 2017-05-24 ENCOUNTER — Telehealth: Payer: Self-pay | Admitting: Allergy and Immunology

## 2017-05-24 MED ORDER — OMEPRAZOLE 40 MG PO CPDR: DELAYED_RELEASE_CAPSULE | ORAL | 0 refills | Status: DC

## 2017-05-24 NOTE — Telephone Encounter (Signed)
Refill sent in last refill until seen. Patient notified.

## 2017-05-24 NOTE — Telephone Encounter (Signed)
Pt called and said that she has appointment next week 05/31/2017 with dr Lucie Leatherkozlow but she is out of the omeprazole and needs a refill or some until her appointment, [facility] [phone].

## 2017-05-30 ENCOUNTER — Ambulatory Visit (INDEPENDENT_AMBULATORY_CARE_PROVIDER_SITE_OTHER): Payer: BC Managed Care – PPO | Admitting: Pulmonary Disease

## 2017-05-30 ENCOUNTER — Encounter: Payer: Self-pay | Admitting: Pulmonary Disease

## 2017-05-30 VITALS — BP 135/94 | HR 85 | Ht 65.0 in | Wt 293.8 lb

## 2017-05-30 DIAGNOSIS — J4551 Severe persistent asthma with (acute) exacerbation: Secondary | ICD-10-CM | POA: Diagnosis not present

## 2017-05-30 MED ORDER — CALCIUM CARBONATE-VITAMIN D 500-200 MG-UNIT PO TABS: 1.0000 | ORAL_TABLET | Freq: Two times a day (BID) | ORAL | 3 refills | Status: DC

## 2017-05-30 NOTE — Patient Instructions (Addendum)
We will send in a prescription for calcium and vitamin D Reduce prednisone to 10 mg.  Reduce dose by 2.5 mg every month Continue on the inhalersand nucala infusion Follow-up in 2 months.

## 2017-05-30 NOTE — Progress Notes (Signed)
Kara [name]    [id number]    07-18-1985  Primary Care Physician:Ross, Dwyane Luo, MD  Referring Physician: Lawerance Cruel, MD [address] [place], [place] [address]  Chief complaint:  Follow up for  Asthma. On Nucala since July 73XT, 0626 Eosinophilic pneumonia  HPI: Kara [name] is a 32 year old with medical history of asthma, alcohol abuse, pancreatitis. She was diagnosed with asthma about 15 years ago and has been tested for allergies before and reports sensitivity to cats, dogs, pollen, mold and multiple other allergens. She has seasonal allergies, postnasal drip and acid reflux.  She has been hospitalized at [facility] in February and April 2018 for recurrent pneumonia treated with antibiotics, steroids, nebulizers. All cultures were negative. She was negative for flu and viral infections or the RVP.  Readmitted in May 2018 with recurrent respiratory failure. Noted to have elevated eosinophils on admission. She was treated with antibiotics and discharged on a prolonged taper. She has been evaluated at the [facility] by Dr. Neldon Mc for [place].  She underwent a bronchoscopy on 10/08/16 which showed an elevated eosinophils in the BAL. Path was negative for malignancy and flow cytometry for CD4 CD8 ratio could not be performed due to insufficient cells. She has started on new nucala.  Pets: No pets Occupation: Works as a Engineer, materials at [employer]: No known exposures at work or at home, no mold issues Smoking history: Smokes a pack a day for past 5 years. Last alcohol use was in December 2017.  Interim History: Continues on nucala infusion and inhalers Breathing is stable on prednisone 12.5 mg.  She required a prednisone taper and doxy in late December 2019.  Still continues to smoke.  Outpatient Encounter Medications as of 05/30/2017  Medication Sig  . albuterol (PROVENTIL) (2.5 MG/3ML) 0.083% nebulizer solution INHALE THE CONTENTS OF 1VIAL VIA  NEBULIZER Q 4-6 H PRN  . chlorhexidine (PERIDEX) 0.12 % solution Use as directed 15 mLs in the mouth or throat 2 (two) times daily. After meals  . cyclobenzaprine (FLEXERIL) 10 MG tablet Take 10 mg by mouth at bedtime.  Marland Kitchen EPINEPHrine (EPIPEN 2-PAK) 0.3 mg/0.3 mL IJ SOAJ injection Inject 0.3 mg into the muscle once as needed (for severe allergic reaction).  . fluticasone furoate-vilanterol (BREO ELLIPTA) 200-25 MCG/INH AEPB Inhale 1 puff into the lungs daily.  . furosemide (LASIX) 20 MG tablet Take 1 tablet (20 mg total) by mouth daily.  Marland Kitchen gabapentin (NEURONTIN) 300 MG capsule Take 3 capsules (900 mg total) by mouth 2 (two) times daily.  Marland Kitchen ipratropium-albuterol (DUONEB) 0.5-2.5 (3) MG/3ML SOLN USE 1 VIAL PER NEBULIZER EVERY 6 HOURS  FOR 30 DAYS  . Melatonin 10 MG TABS Take 10 mg by mouth at bedtime.  . meloxicam (MOBIC) 15 MG tablet Take 1 tablet (15 mg total) by mouth daily as needed for pain.  . montelukast (SINGULAIR) 10 MG tablet TAKE 1 TABLET BY MOUTH DAILY( GENERIC EQUIVALENT FOR SINGULAIR)  . Multiple Vitamins-Minerals (MULTIVITAMIN WITH MINERALS) tablet Take 1 tablet by mouth daily.  . naltrexone (DEPADE) 50 MG tablet Take 50 mg by mouth daily.  . nicotine (NICODERM CQ - DOSED IN MG/24 HOURS) 21 mg/24hr patch Place 1 patch (21 mg total) onto the skin daily.  Marland Kitchen omeprazole (PRILOSEC) 40 MG capsule TAKE 1 CAPSULE(40 MG) BY MOUTH DAILY  . predniSONE (DELTASONE) 20 MG tablet Take 0.5 tablets (10 mg total) by mouth daily with breakfast.  . predniSONE (DELTASONE) 5  MG tablet Take 1 tablet (5 mg total) by mouth daily with breakfast.  . PROAIR HFA 108 (90 Base) MCG/ACT inhaler INHALE 2 PUFFS INTO THE LUNGS EVERY 6 HOURS AS NEEDED FOR WHEEZING OR SHORTNESS OF BREATH  . QUEtiapine (SEROQUEL) 50 MG tablet Take 100 mg by mouth at bedtime.   . sertraline (ZOLOFT) 50 MG tablet Take 75 mg by mouth daily.  Marland Kitchen thiamine 100 MG tablet Take 100 mg by mouth daily.  . traMADol (ULTRAM) 50 MG tablet Take 50 mg by  mouth 3 (three) times daily.  Marland Kitchen umeclidinium bromide (INCRUSE ELLIPTA) 62.5 MCG/INH AEPB Inhale 1 puff into the lungs daily.  . Cholecalciferol (VITAMIN D PO) Take 1 tablet by mouth daily.   . metoprolol succinate (TOPROL-XL) 50 MG 24 hr tablet Take 1 tablet (50 mg total) by mouth daily. Take with or immediately following a meal.  . [DISCONTINUED] doxycycline (VIBRA-TABS) 100 MG tablet Take 1 tablet (100 mg total) by mouth 2 (two) times daily. (Patient not taking: Reported on 05/30/2017)  . [DISCONTINUED] predniSONE (DELTASONE) 10 MG tablet 4 pills daily for 2 days, 3 pills daily for 2 days, 2 pills daily for 2 days (Patient not taking: Reported on 05/30/2017)   Facility-Administered Encounter Medications as of 05/30/2017  Medication  . Mepolizumab SOLR 100 mg    Allergies as of 05/30/2017 - Review Complete 05/30/2017  Allergen Reaction Noted  . Peanuts [peanut oil] Hives and Itching 07/06/2014    Past Medical History:  Diagnosis Date  . Alcohol abuse   . Asthma   . Environmental allergies   . GERD (gastroesophageal reflux disease)   . Headache   . Hypertension   . Pancreatitis   . Prolonged QT syndrome   . Syncope and collapse   . Vision abnormalities     Past Surgical History:  Procedure Laterality Date  . ESOPHAGOGASTRODUODENOSCOPY N/A 07/17/2014   Procedure: ESOPHAGOGASTRODUODENOSCOPY (EGD);  Surgeon: Teena Irani, MD;  Location: [facility];  Service: Endoscopy;  Laterality: N/A;  . TYMPANOSTOMY TUBE PLACEMENT    . VIDEO BRONCHOSCOPY Bilateral 10/08/2016   Procedure: VIDEO BRONCHOSCOPY WITH FLUORO;  Surgeon: Marshell Garfinkel, MD;  Location: [facility];  Service: Cardiopulmonary;  Laterality: Bilateral;    Family History  Problem Relation Age of Onset  . Heart failure Mother   . Heart failure Father   . Diabetes Father   . Heart failure Maternal Grandfather     Social History   Socioeconomic History  . Marital status: Married    Spouse name: Winferd Humphrey  . Number of  children: 0  . Years of education: Not on file  . Highest education level: Not on file  Social Needs  . Financial resource strain: Not on file  . Food insecurity - worry: Not on file  . Food insecurity - inability: Not on file  . Transportation needs - medical: Not on file  . Transportation needs - non-medical: Not on file  Occupational History  . Occupation: Investment banker, corporate  Tobacco Use  . Smoking status: Current Every Day Smoker    Packs/day: 0.50    Years: 5.00    Pack years: 2.50    Types: Cigarettes  . Smokeless tobacco: Never Used  . Tobacco comment: Pt states that she is trying to quit. Is currently wearing nicotine patch.  Substance and Sexual Activity  . Alcohol use: No    Alcohol/week: 1.8 oz    Types: 3 Shots of liquor per week  . Drug use: No  . Sexual  activity: Yes    Birth control/protection: None  Other Topics Concern  . Not on file  Social History Narrative   Married.    Review of systems: Review of Systems  Constitutional: Negative for fever and chills.  HENT: Negative.   Eyes: Negative for blurred vision.  Respiratory: as per HPI  Cardiovascular: Negative for chest pain and palpitations.  Gastrointestinal: Negative for vomiting, diarrhea, blood per rectum. Genitourinary: Negative for dysuria, urgency, frequency and hematuria.  Musculoskeletal: Negative for myalgias, back pain and joint pain.  Skin: Negative for itching and rash.  Neurological: Negative for dizziness, tremors, focal weakness, seizures and loss of consciousness.  Endo/Heme/Allergies: Negative for environmental allergies.  Psychiatric/Behavioral: Negative for depression, suicidal ideas and hallucinations.  All other systems reviewed and are negative.  Physical Exam: Blood pressure (!) 135/94, pulse 85, height _0  (1.651 m), weight 293 lb 12.8 oz (133.3 kg), SpO2 92 %. Gen:      No acute distress HEENT:  EOMI, sclera anicteric Neck:     No masses; no thyromegaly Lungs:    Clear to  auscultation bilaterally; normal respiratory effort CV:         Regular rate and rhythm; no murmurs Abd:      + bowel sounds; soft, non-tender; no palpable masses, no distension Ext:    No edema; adequate peripheral perfusion Skin:      Warm and dry; no rash Neuro: alert and oriented x 3 Psych: normal mood and affect  Data Reviewed: CT scan- 06/13/16- diffuse interstitial groundglass opacities, mild mediastinal adenopathy CXR 08/01/16- bilateral interstitial opacities High res CT 10/06/16- no evidence of interstitial lung disease, improvement in groundglass opacities with mild residual opacity in the left upper lobe. Left upper lobe 0.5 centimeter groundglass opacity.  Chest x-ray 11/15/16-no acute cardiopulmonary abnormality. I reviewed all images personally.  FENO 08/06/16-13  Serologies ANA, ANCA, RA negative HSP panel 08/27/16- negative  Sp cx 5/15- [employer], regular cultures are negative  IgE 08/27/16- 902  PFT 09/22/16 FVC 2.89 (74%), FEV1 2.36 (72%), F/F 82 ,TLC 84%, DLCO 87% Minimal obstructive, small airways disease  Bronch 10/08/16 Micro, path is negative. Flow cytometry not done Cell count WBC 485, L 22%, Eos 20%, neuts 31%, Mon macrophage 27%  Assessment:  Severe persistent asthma Eosinophilic PNA Workup for interstitial lung disease, hypersensitivity panel is negative.  Differential diagnosis are uncontrolled asthma, eosinophilic pneumonias. Her BAL shows high eosinophil count. Although the 20% in BAL does not reach the formal criteria for eosinophilic pneumonia, we can still give her the diagnosis as she got off prednisone just about 5 days before the procedure.   Continue slow prednisone taper and reduce to 10 mg today and reduce by 2.5 mg every month. .  Add calcium and vitamin D supplementation She continues on nucala per Dr. Neldon Mc  Active smoker She continues to smoke half pack per day which is contributing to her symptoms Reiterated that she needs to quit as  soon as possible.  Time spent counseling-5 minutes  Plan/Recommendations: - Continue breo and incruse - Reduce prednisone to 10 mg - Calcium, vitamin D supplementation - Continue nucala - Smoking cessation  Return to clinic in 2 month.  Marshell Garfinkel MD [facility] Pager [phone] 05/30/2017, 4:25 PM  CC: Lawerance Cruel, MD

## 2017-05-31 ENCOUNTER — Encounter: Payer: Self-pay | Admitting: Allergy and Immunology

## 2017-05-31 ENCOUNTER — Ambulatory Visit (INDEPENDENT_AMBULATORY_CARE_PROVIDER_SITE_OTHER): Payer: BC Managed Care – PPO | Admitting: *Deleted

## 2017-05-31 ENCOUNTER — Ambulatory Visit: Payer: BC Managed Care – PPO | Admitting: Allergy and Immunology

## 2017-05-31 VITALS — BP 140/82 | HR 84 | Resp 16

## 2017-05-31 DIAGNOSIS — F17210 Nicotine dependence, cigarettes, uncomplicated: Secondary | ICD-10-CM

## 2017-05-31 DIAGNOSIS — J3089 Other allergic rhinitis: Secondary | ICD-10-CM

## 2017-05-31 DIAGNOSIS — J4550 Severe persistent asthma, uncomplicated: Secondary | ICD-10-CM

## 2017-05-31 DIAGNOSIS — J82 Pulmonary eosinophilia, not elsewhere classified: Secondary | ICD-10-CM

## 2017-05-31 DIAGNOSIS — K219 Gastro-esophageal reflux disease without esophagitis: Secondary | ICD-10-CM | POA: Diagnosis not present

## 2017-05-31 DIAGNOSIS — J8281 Chronic eosinophilic pneumonia: Secondary | ICD-10-CM

## 2017-05-31 MED ORDER — OMEPRAZOLE 40 MG PO CPDR: DELAYED_RELEASE_CAPSULE | ORAL | 3 refills | Status: DC

## 2017-05-31 NOTE — Progress Notes (Addendum)
Follow-up Note  Referring Provider: [name], MD Primary Provider: [name], MD Date of Office Visit: 05/31/2017  Subjective:   Kara [name] (DOB: [birth year]) is a 32 y.o. female [facility] in re-evaluation of the following:  HPI: Kara [name] returns to this clinic in reevaluation of her severe steroid-dependent asthma and history of eosinophilic pneumonia and allergic rhinoconjunctivitis, reflux, food allergy, and tobacco use.  Her last visit to this clinic was 20 to May 2018 at which point in time we started her on NUCALA.  She has been on 6 months of NUCALA and is not that impressed that it has helped very much.  She still continues to have daily wheezing and coughing and using a bronchodilator and she has not been able to come off her prednisone.  Currently she is on 10 mg prednisone daily in conjunction with other inhalational anti-inflammatory agents and a leukotriene modifier.  She visits with Dr. Isaiah [name] about every 2 months or so who has been altering her medications in an attempt to taper her down off her systemic steroids.  She did require what sounds like 2 courses of antibiotics and high dose prednisone for "bronchitis".  Her reflux is going quite well.  Her nose has not really been causing her any problem.  Allergies as of 05/31/2017      Reactions   Peanuts [peanut Oil] Hives, Itching      Medication List      albuterol (2.5 MG/3ML) 0.083% nebulizer solution Commonly known as:  PROVENTIL INHALE THE CONTENTS OF 1VIAL VIA NEBULIZER Q 4-6 H PRN   PROAIR HFA 108 (90 Base) MCG/ACT inhaler Generic drug:  albuterol INHALE 2 PUFFS INTO THE LUNGS EVERY 6 HOURS AS NEEDED FOR WHEEZING OR SHORTNESS OF BREATH   calcium-vitamin D 500-200 MG-UNIT tablet Commonly known as:  OSCAL WITH D Take 1 tablet by mouth 2 (two) times daily.   chlorhexidine 0.12 % solution Commonly known as:  PERIDEX Use as directed 15 mLs  in the mouth or throat 2 (two) times daily. After meals   cyclobenzaprine 10 MG tablet Commonly known as:  FLEXERIL Take 10 mg by mouth at bedtime.   EPIPEN 2-PAK 0.3 mg/0.3 mL Soaj injection Generic drug:  EPINEPHrine Inject 0.3 mg into the muscle once as needed (for severe allergic reaction).   BREO ELLIPTA 200-25 MCG/INH Aepb Generic drug:  fluticasone furoate-vilanterol INL 1 PUFF ITL D   fluticasone furoate-vilanterol 200-25 MCG/INH Aepb Commonly known as:  BREO ELLIPTA Inhale 1 puff into the lungs daily.   furosemide 20 MG tablet Commonly known as:  LASIX Take 1 tablet (20 mg total) by mouth daily.   gabapentin 300 MG capsule Commonly known as:  NEURONTIN Take 3 capsules (900 mg total) by mouth 2 (two) times daily.   ipratropium-albuterol 0.5-2.5 (3) MG/3ML Soln Commonly known as:  DUONEB USE 1 VIAL PER NEBULIZER EVERY 6 HOURS  FOR 30 DAYS   Melatonin 10 MG Tabs Take 10 mg by mouth at bedtime.   meloxicam 15 MG tablet Commonly known as:  MOBIC Take 1 tablet (15 mg total) by mouth daily as needed for pain.   metoprolol succinate 50 MG 24 hr tablet Commonly known as:  TOPROL-XL Take 1 tablet (50 mg total) by mouth daily. Take with or immediately following a meal.   montelukast 10 MG tablet Commonly known as:  SINGULAIR TAKE 1 TABLET BY MOUTH DAILY( GENERIC EQUIVALENT FOR SINGULAIR)  multivitamin with minerals tablet Take 1 tablet by mouth daily.   naltrexone 50 MG tablet Commonly known as:  DEPADE Take 50 mg by mouth daily.   nicotine 21 mg/24hr patch Commonly known as:  NICODERM CQ - dosed in mg/24 hours Place 1 patch (21 mg total) onto the skin daily.   omeprazole 40 MG capsule Commonly known as:  PRILOSEC TAKE 1 CAPSULE(40 MG) BY MOUTH DAILY   predniSONE 10 MG tablet Commonly known as:  DELTASONE Take 10 mg by mouth daily with breakfast.   QUEtiapine 50 MG tablet Commonly known as:  SEROQUEL Take 100 mg by mouth at bedtime.   sertraline 50  MG tablet Commonly known as:  ZOLOFT Take 75 mg by mouth daily.   thiamine 100 MG tablet Take 100 mg by mouth daily.   traMADol 50 MG tablet Commonly known as:  ULTRAM Take 50 mg by mouth 3 (three) times daily.   umeclidinium bromide 62.5 MCG/INH Aepb Commonly known as:  INCRUSE ELLIPTA Inhale 1 puff into the lungs daily.   VITAMIN D PO Take 1 tablet by mouth daily.       Past Medical History:  Diagnosis Date  . Alcohol abuse   . Asthma   . Environmental allergies   . GERD (gastroesophageal reflux disease)   . Headache   . Hypertension   . Pancreatitis   . Prolonged QT syndrome   . Syncope and collapse   . Vision abnormalities     Past Surgical History:  Procedure Laterality Date  . ESOPHAGOGASTRODUODENOSCOPY N/A 07/17/2014   Procedure: ESOPHAGOGASTRODUODENOSCOPY (EGD);  Surgeon: Kara CookeyJohn Hayes, MD;  Location: [facility];  Service: Endoscopy;  Laterality: N/A;  . TYMPANOSTOMY TUBE PLACEMENT    . VIDEO BRONCHOSCOPY Bilateral 10/08/2016   Procedure: VIDEO BRONCHOSCOPY WITH FLUORO;  Surgeon: Kara GreathouseMannam, Praveen, MD;  Location: [facility];  Service: Cardiopulmonary;  Laterality: Bilateral;    Review of systems negative except as noted in HPI / PMHx or noted below:  Review of Systems  Constitutional: Negative.   HENT: Negative.   Eyes: Negative.   Respiratory: Negative.   Cardiovascular: Negative.   Gastrointestinal: Negative.   Genitourinary: Negative.   Musculoskeletal: Negative.   Skin: Negative.   Neurological: Negative.   Endo/Heme/Allergies: Negative.   Psychiatric/Behavioral: Negative.      Objective:   Vitals:   05/31/17 1745  BP: 140/82  Pulse: 84  Resp: 16          Physical Exam  Constitutional: She is well-developed, well-nourished, and in no distress.  Moon face, obese  HENT:  Head: Normocephalic.  Right Ear: Tympanic membrane, external ear and ear canal normal.  Left Ear: External ear and ear canal normal. Tympanic membrane is scarred.    Nose: Nose normal. No mucosal edema or rhinorrhea.  Mouth/Throat: Uvula is midline, oropharynx is clear and moist and mucous membranes are normal. No oropharyngeal exudate.  Eyes: Conjunctivae are normal.  Neck: Trachea normal. No tracheal tenderness present. No tracheal deviation present. No thyromegaly present.  Cardiovascular: Normal rate, regular rhythm, S1 normal, S2 normal and normal heart sounds.  No murmur heard. Pulmonary/Chest: Breath sounds normal. No stridor. No respiratory distress. She has no wheezes. She has no rales.  Musculoskeletal: She exhibits no edema.  Lymphadenopathy:       Head (right side): No tonsillar adenopathy present.       Head (left side): No tonsillar adenopathy present.    She has no cervical adenopathy.  Neurological: She is alert. Gait normal.  Skin: No rash noted. She is not diaphoretic. No erythema. Nails show no clubbing.  Psychiatric: Mood and affect normal.    Diagnostics:    Spirometry was performed and demonstrated an FEV1 of 1.83 at 56 % of predicted.  The patient had an Asthma Control Test with the following results: ACT Total Score: 16.    Results of blood tests obtained for June 2018 identified IgG 516 mg/DL, IgM 63 mg/DL, IgA 161 mg/DL, negative ANCA scan.  Assessment and Plan:   1. Steroid-dependent asthma, severe persistent, uncomplicated   2. Other allergic rhinitis   3. Gastroesophageal reflux disease, esophagitis presence not specified   4. Tobacco smoker, less than 10 cigarettes per day   5. Eosinophilic pneumonia (HCC)     1. Continue Breo 200 + Incruse  2. Continue montelukast 10 mg one tablet once a day  3. Continue omeprazole 40 mg once a day  4. Continue daily prednisone  5. Continue Ventolin HFA or similar if needed  6. Continue EpiPen if needed  7. Continue antihistamine if needed  8. Submit for dupilumab administration to replace NUCALA for a 16-month trial   9. Use nicotine substitutes to replace tobacco  smoke exposure.    10. Return to clinic in 6 months  Nikira has had a 57-month trial of NUCALA and she is not very impressed with the response that she has received with this form of therapy and in reviewing her record she does appear to have continued problems with significant inflammation of her airway even in the face of very close management by her pulmonologist.  I would like to change her biological agent to a combination anti-IL 4/anti-IL 13 antibody for the next 6 months to see if we get better control of her respiratory tract inflammation and possibly being able to taper her off systemic steroids.  We will need to follow her eosinophilic as some individuals who use dupilumab end up developing eosinophilia with organ dysfunction and given her previous history of eosinophilic pneumonia I think it would be best to check her eosinophil count every month while she utilizes this form of therapy for the first 6 months.  I also had a talk with her today about her smoking.  I have informed her that she probably will not get clearing of her lung inflammation while she continues to smoke and she will be relegated to using multiple agents in an attempt to treat this inflammation with inadequate results as long as she continues to smoke.  I have encouraged her to find a nicotine substitutes to replace her tobacco smoke exposure.  Fortunately, her husband has stopped smoking indoors and now it only appears that there is smoking outdoors at this point.  I will see her back at the end of 6 months of dupilumab administration.  Laurette Schimke, MD Allergy / Immunology [facility]

## 2017-05-31 NOTE — Patient Instructions (Addendum)
  1. Continue Breo 200 + Incruse  2. Continue montelukast 10 mg one tablet once a day  3. Continue omeprazole 40 mg once a day  4. Continue daily prednisone  5. Continue Ventolin HFA or similar if needed  6. Continue EpiPen if needed  7. Continue antihistamine if needed  8. Submit for dupilumab administration to replace NUCALA for a 7851-month trial   9. Use nicotine substitutes to replace tobacco smoke exposure.    10. Return to clinic in 6 months

## 2017-06-01 ENCOUNTER — Encounter: Payer: Self-pay | Admitting: Allergy and Immunology

## 2017-06-02 ENCOUNTER — Other Ambulatory Visit: Payer: Self-pay | Admitting: Allergy and Immunology

## 2017-06-02 ENCOUNTER — Telehealth: Payer: Self-pay | Admitting: *Deleted

## 2017-06-02 NOTE — Telephone Encounter (Signed)
Patient advised of instructions. 

## 2017-06-02 NOTE — Telephone Encounter (Signed)
L/M for patient to contact me so I can go over instructions per Dr Lucie LeatherKozlow regarding getting labs done monthly

## 2017-06-02 NOTE — Telephone Encounter (Signed)
-----   Message from Jessica PriestEric J Kozlow, MD sent at 06/02/2017  8:09 AM EST ----- Please inform Kara Folksmanda that we will need to check her CBC with differential looking at her eosinophil count while she uses dupilumab.  Thus, she will need to have a CBC with differential prior to starting her dupilumab, once it is approved, and every month while utilizing this therapy for the initial 6 months

## 2017-06-09 ENCOUNTER — Telehealth: Payer: Self-pay | Admitting: *Deleted

## 2017-06-09 DIAGNOSIS — Z5181 Encounter for therapeutic drug level monitoring: Secondary | ICD-10-CM

## 2017-06-09 DIAGNOSIS — J8281 Chronic eosinophilic pneumonia: Secondary | ICD-10-CM

## 2017-06-09 DIAGNOSIS — J82 Pulmonary eosinophilia, not elsewhere classified: Principal | ICD-10-CM

## 2017-06-09 NOTE — Telephone Encounter (Signed)
L/M for patient to advise her delivery of Dupixent today and to call [facility] and make appt to get started on injections also to get lab drawn prior to receiving injections and monthly for next 6 months as we had discussed per Dr Lucie LeatherKozlow. Faxed note to [facility] and order put in to have CBC collected

## 2017-06-10 ENCOUNTER — Other Ambulatory Visit: Payer: Self-pay | Admitting: Allergy and Immunology

## 2017-06-17 ENCOUNTER — Telehealth: Payer: Self-pay | Admitting: Pulmonary Disease

## 2017-06-17 MED ORDER — DOXYCYCLINE HYCLATE 100 MG PO TABS: 100.0000 mg | ORAL_TABLET | Freq: Two times a day (BID) | ORAL | 0 refills | Status: DC

## 2017-06-17 NOTE — Telephone Encounter (Signed)
Called pt letting her know I was sending script of doxy to her pharmacy.  Pt expressed understanding. Nothing further needed at this current time.

## 2017-06-17 NOTE — Telephone Encounter (Signed)
Ok to call in doxy 100 mg bid for 7 days. Thanks

## 2017-06-17 NOTE — Telephone Encounter (Signed)
Pt c/o chest tightness, difficulty taking in a deep breath, nonprod cough X2 days.   Denies fever, body aches, mucus production, sinus congestion. Not taking anything to help with s/s- requesting recs. Pt notes that Doxycycline has helped with these similar s/s in the past.  States she's had PNA  In the past and that it feels similar.   Pt uses [facility] on [address]   Dr Isaiah Sergemannam please advise on recs.  Thanks!

## 2017-06-17 NOTE — Telephone Encounter (Signed)
Pt wants to know if she can get doxycycline it helped her last time she had it.

## 2017-06-20 ENCOUNTER — Ambulatory Visit (INDEPENDENT_AMBULATORY_CARE_PROVIDER_SITE_OTHER): Payer: BC Managed Care – PPO | Admitting: *Deleted

## 2017-06-20 DIAGNOSIS — J4550 Severe persistent asthma, uncomplicated: Secondary | ICD-10-CM | POA: Diagnosis not present

## 2017-06-20 NOTE — Addendum Note (Signed)
Addended by: Mliss FritzBLACK, Jamilee Lafosse I on: 06/20/2017 02:13 PM   Modules accepted: Orders

## 2017-06-20 NOTE — Progress Notes (Signed)
Immunotherapy   Patient Details  Name: Kara [name] MRN: [id number] Date of Birth: 09/03/1985  06/20/2017  Kara [name] started injections for  Dupixent 600mg  today then 300 mg every 2 weeks.  Epi-Pen:Epi-Pen Available  Consent signed and patient instructions given. No problems after 30 minutes in the office.   Mariane DuvalHeather L Vernon 06/20/2017, 1:44 PM

## 2017-06-21 ENCOUNTER — Other Ambulatory Visit: Payer: Self-pay

## 2017-06-21 LAB — CBC WITH DIFFERENTIAL/PLATELET
BASOS ABS: 0.1 10*3/uL (ref 0.0–0.2)
Basos: 0 %
EOS (ABSOLUTE): 0.1 10*3/uL (ref 0.0–0.4)
Eos: 0 %
Hematocrit: 41 % (ref 34.0–46.6)
Hemoglobin: 14.1 g/dL (ref 11.1–15.9)
IMMATURE GRANULOCYTES: 1 %
Immature Grans (Abs): 0.2 10*3/uL — ABNORMAL HIGH (ref 0.0–0.1)
Lymphocytes Absolute: 2 10*3/uL (ref 0.7–3.1)
Lymphs: 12 %
MCH: 28.3 pg (ref 26.6–33.0)
MCHC: 34.4 g/dL (ref 31.5–35.7)
MCV: 82 fL (ref 79–97)
MONOS ABS: 0.8 10*3/uL (ref 0.1–0.9)
Monocytes: 5 %
NEUTROS PCT: 82 %
Neutrophils Absolute: 13.1 10*3/uL — ABNORMAL HIGH (ref 1.4–7.0)
PLATELETS: 230 10*3/uL (ref 150–379)
RBC: 4.98 x10E6/uL (ref 3.77–5.28)
RDW: 15.3 % (ref 12.3–15.4)
WBC: 16 10*3/uL — ABNORMAL HIGH (ref 3.4–10.8)

## 2017-06-28 ENCOUNTER — Ambulatory Visit: Payer: BC Managed Care – PPO

## 2017-07-04 NOTE — Telephone Encounter (Signed)
Error

## 2017-07-19 ENCOUNTER — Ambulatory Visit (HOSPITAL_BASED_OUTPATIENT_CLINIC_OR_DEPARTMENT_OTHER): Payer: BC Managed Care – PPO

## 2017-07-19 ENCOUNTER — Other Ambulatory Visit (HOSPITAL_BASED_OUTPATIENT_CLINIC_OR_DEPARTMENT_OTHER): Payer: Self-pay | Admitting: Family Medicine

## 2017-07-19 DIAGNOSIS — R2243 Localized swelling, mass and lump, lower limb, bilateral: Secondary | ICD-10-CM

## 2017-07-19 DIAGNOSIS — L539 Erythematous condition, unspecified: Secondary | ICD-10-CM

## 2017-07-19 DIAGNOSIS — M7989 Other specified soft tissue disorders: Secondary | ICD-10-CM

## 2017-07-20 ENCOUNTER — Ambulatory Visit (HOSPITAL_BASED_OUTPATIENT_CLINIC_OR_DEPARTMENT_OTHER): Payer: BC Managed Care – PPO

## 2017-07-28 ENCOUNTER — Ambulatory Visit: Payer: BC Managed Care – PPO | Admitting: Pulmonary Disease

## 2017-07-28 ENCOUNTER — Encounter: Payer: Self-pay | Admitting: Pulmonary Disease

## 2017-07-28 VITALS — BP 128/64 | HR 80 | Ht 65.0 in | Wt 301.6 lb

## 2017-07-28 DIAGNOSIS — J849 Interstitial pulmonary disease, unspecified: Secondary | ICD-10-CM

## 2017-07-28 DIAGNOSIS — J4551 Severe persistent asthma with (acute) exacerbation: Secondary | ICD-10-CM | POA: Diagnosis not present

## 2017-07-28 MED ORDER — PREDNISONE 5 MG PO TABS: ORAL_TABLET | ORAL | 1 refills | Status: DC

## 2017-07-28 NOTE — Patient Instructions (Signed)
I am glad you are doing better on the new regimen for asthma Continue using your inhalers as directed Wean down prednisone to 5 mg and then by 2.5 mg every month I will see you in clinic in 2-3 months.

## 2017-07-28 NOTE — Progress Notes (Signed)
Kara [name]    [id number]    [birth year]  Primary Care Physician:Ross, Dwyane Luo, MD  Referring Physician: Lawerance Cruel, MD [address] [place], [place] [address]  Chief complaint:  Follow up for  Asthma. 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 She was diagnosed with asthma about 15 years ago and has been tested for allergies before and reports sensitivity to cats, dogs, pollen, mold and multiple other allergens. She has seasonal allergies, postnasal drip and acid reflux.  She has been hospitalized at [facility] in February and April 2018 for recurrent pneumonia treated with antibiotics, steroids, nebulizers. All cultures were negative. She was negative for flu and viral infections or the RVP.  Readmitted in May 2018 with recurrent respiratory failure. Noted to have elevated eosinophils on admission. She was treated with antibiotics and discharged on a prolonged taper. She has been evaluated at the [place] by Dr. Neldon Mc for [place].  She underwent a bronchoscopy on 10/08/16 which showed an elevated eosinophils in the BAL. Path was negative for malignancy and flow cytometry for CD4 CD8 ratio could not be performed due to insufficient cells. She has started on new nucala.  Pets: No pets Occupation: Works as a Engineer, materials at [employer]: No known exposures at work or at home, no mold issues Smoking history: Smokes a pack a day for past 5 years. Last alcohol use was in December 2017.  Interim History: Her infusion therapy has been changed to dupixent per Dr. Neldon Mc Breathing is stable.  She continues on slow prednisone taper.  Still continues to smoke.  Outpatient Encounter Medications as of 07/28/2017  Medication Sig  . albuterol (PROVENTIL) (2.5 MG/3ML) 0.083% nebulizer solution INHALE THE CONTENTS OF 1VIAL  VIA NEBULIZER Q 4-6 H PRN  . buPROPion (WELLBUTRIN XL) 150 MG 24 hr tablet Take 150 mg by mouth daily.  . calcium-vitamin D (OSCAL WITH D) 500-200 MG-UNIT tablet Take 1 tablet by mouth 2 (two) times daily.  . chlorhexidine (PERIDEX) 0.12 % solution Use as directed 15 mLs in the mouth or throat 2 (two) times daily. After meals  . Cholecalciferol (VITAMIN D PO) Take 1 tablet by mouth daily.   Marland Kitchen doxycycline (VIBRA-TABS) 100 MG tablet Take 1 tablet (100 mg total) by mouth 2 (two) times daily.  . DUPIXENT 300 MG/2ML SOSY SHIP FIRST:INJECT 2 SYRINGES SUBCUTANEOUSLY ON DAY 1, THEN INJECT 1 SYRINGE ON DAY 15 , THEN 1 SYRINGE EVERY OTHER WEEK THEREAFTER.  Marland Kitchen EPINEPHrine (EPIPEN 2-PAK) 0.3 mg/0.3 mL IJ SOAJ injection Inject 0.3 mg into the muscle once as needed (for severe allergic reaction).  . fluticasone furoate-vilanterol (BREO ELLIPTA) 200-25 MCG/INH AEPB Inhale 1 puff into the lungs daily.  . furosemide (LASIX) 20 MG tablet Take 1 tablet (20 mg total) by mouth daily.  Marland Kitchen gabapentin (NEURONTIN) 300 MG capsule Take 3 capsules (900 mg total) by mouth 2 (two) times daily.  Marland Kitchen ipratropium-albuterol (DUONEB) 0.5-2.5 (3) MG/3ML SOLN USE 1 VIAL PER NEBULIZER EVERY 6 HOURS  FOR 30 DAYS  . Melatonin 10 MG TABS Take 10 mg by mouth at bedtime.  . montelukast (SINGULAIR) 10 MG tablet TAKE 1 TABLET BY MOUTH DAILY( OFFICE VISIT NEEDED FOR FURTHER REFILLS)  . Multiple Vitamins-Minerals (MULTIVITAMIN WITH MINERALS) tablet Take 1 tablet by mouth daily.  . naltrexone (DEPADE) 50 MG tablet Take  50 mg by mouth daily.  . nicotine (NICODERM CQ - DOSED IN MG/24 HOURS) 21 mg/24hr patch Place 1 patch (21 mg total) onto the skin daily.  Marland Kitchen omeprazole (PRILOSEC) 40 MG capsule TAKE 1 CAPSULE(40 MG) BY MOUTH DAILY  . PROAIR HFA 108 (90 Base) MCG/ACT inhaler INHALE 2 PUFFS INTO THE LUNGS EVERY 6 HOURS AS NEEDED FOR WHEEZING OR SHORTNESS OF BREATH  . QUEtiapine (SEROQUEL) 50 MG tablet Take 100 mg by mouth at bedtime.   . rosuvastatin  (CRESTOR) 10 MG tablet Take 10 mg by mouth daily.  . sertraline (ZOLOFT) 50 MG tablet Take 75 mg by mouth daily.  Marland Kitchen thiamine 100 MG tablet Take 100 mg by mouth daily.  . traMADol (ULTRAM) 50 MG tablet Take 50 mg by mouth 3 (three) times daily.  Marland Kitchen umeclidinium bromide (INCRUSE ELLIPTA) 62.5 MCG/INH AEPB Inhale 1 puff into the lungs daily.  . [DISCONTINUED] cyclobenzaprine (FLEXERIL) 10 MG tablet Take 10 mg by mouth at bedtime.  . [DISCONTINUED] fluticasone furoate-vilanterol (BREO ELLIPTA) 200-25 MCG/INH AEPB INL 1 PUFF ITL D  . [DISCONTINUED] meloxicam (MOBIC) 15 MG tablet Take 1 tablet (15 mg total) by mouth daily as needed for pain.  . metoprolol succinate (TOPROL-XL) 50 MG 24 hr tablet Take 1 tablet (50 mg total) by mouth daily. Take with or immediately following a meal.   No facility-administered encounter medications on file as of 07/28/2017.     Allergies as of 07/28/2017 - Review Complete 07/28/2017  Allergen Reaction Noted  . Peanuts [peanut oil] Hives and Itching 07/06/2014    Past Medical History:  Diagnosis Date  . Alcohol abuse   . Asthma   . Environmental allergies   . GERD (gastroesophageal reflux disease)   . Headache   . Hypertension   . Pancreatitis   . Prolonged QT syndrome   . Syncope and collapse   . Vision abnormalities     Past Surgical History:  Procedure Laterality Date  . ESOPHAGOGASTRODUODENOSCOPY N/A 07/17/2014   Procedure: ESOPHAGOGASTRODUODENOSCOPY (EGD);  Surgeon: Teena Irani, MD;  Location: [facility];  Service: Endoscopy;  Laterality: N/A;  . TYMPANOSTOMY TUBE PLACEMENT    . VIDEO BRONCHOSCOPY Bilateral 10/08/2016   Procedure: VIDEO BRONCHOSCOPY WITH FLUORO;  Surgeon: Marshell Garfinkel, MD;  Location: [facility];  Service: Cardiopulmonary;  Laterality: Bilateral;    Family History  Problem Relation Age of Onset  . Heart failure Mother   . Heart failure Father   . Diabetes Father   . Heart failure Maternal Grandfather     Social History    Socioeconomic History  . Marital status: Married    Spouse name: Winferd Humphrey  . Number of children: 0  . Years of education: Not on file  . Highest education level: Not on file  Occupational History  . Occupation: Investment banker, corporate  Social Needs  . Financial resource strain: Not on file  . Food insecurity:    Worry: Not on file    Inability: Not on file  . Transportation needs:    Medical: Not on file    Non-medical: Not on file  Tobacco Use  . Smoking status: Current Every Day Smoker    Packs/day: 0.50    Years: 5.00    Pack years: 2.50    Types: Cigarettes  . Smokeless tobacco: Never Used  . Tobacco comment: Pt states that she is trying to quit. Is currently wearing nicotine patch.  Substance and Sexual Activity  . Alcohol use: No    Alcohol/week: 1.8 oz  Types: 3 Shots of liquor per week  . Drug use: No  . Sexual activity: Yes    Birth control/protection: None  Lifestyle  . Physical activity:    Days per week: Not on file    Minutes per session: Not on file  . Stress: Not on file  Relationships  . Social connections:    Talks on phone: Not on file    Gets together: Not on file    Attends religious service: Not on file    Active member of club or organization: Not on file    Attends meetings of clubs or organizations: Not on file    Relationship status: Not on file  . Intimate partner violence:    Fear of current or ex partner: Not on file    Emotionally abused: Not on file    Physically abused: Not on file    Forced sexual activity: Not on file  Other Topics Concern  . Not on file  Social History Narrative   Married.    Review of systems: Review of Systems  Constitutional: Negative for fever and chills.  HENT: Negative.   Eyes: Negative for blurred vision.  Respiratory: as per HPI  Cardiovascular: Negative for chest pain and palpitations.  Gastrointestinal: Negative for vomiting, diarrhea, blood per rectum. Genitourinary: Negative for dysuria,  urgency, frequency and hematuria.  Musculoskeletal: Negative for myalgias, back pain and joint pain.  Skin: Negative for itching and rash.  Neurological: Negative for dizziness, tremors, focal weakness, seizures and loss of consciousness.  Endo/Heme/Allergies: Negative for environmental allergies.  Psychiatric/Behavioral: Negative for depression, suicidal ideas and hallucinations.  All other systems reviewed and are negative.  Physical Exam: Blood pressure 128/64, pulse 80, height _0  (1.651 m), weight (!) 301 lb 9.6 oz (136.8 kg), SpO2 98 %. Gen:      No acute distress HEENT:  EOMI, sclera anicteric Neck:     No masses; no thyromegaly Lungs:    Clear to auscultation bilaterally; normal respiratory effort CV:         Regular rate and rhythm; no murmurs Abd:      + bowel sounds; soft, non-tender; no palpable masses, no distension Ext:    No edema; adequate peripheral perfusion Skin:      Warm and dry; no rash Neuro: alert and oriented x 3 Psych: normal mood and affect  Data Reviewed: CT scan- 06/13/16- diffuse interstitial groundglass opacities, mild mediastinal adenopathy CXR 08/01/16- bilateral interstitial opacities High res CT 10/06/16- no evidence of interstitial lung disease, improvement in groundglass opacities with mild residual opacity in the left upper lobe. Left upper lobe 0.5 centimeter groundglass opacity.  Chest x-ray 11/15/16-no acute cardiopulmonary abnormality. I reviewed all images personally.  FENO 08/06/16-13  Serologies ANA, ANCA, RA negative HSP panel 08/27/16- negative  Sp cx 5/15- [employer], regular cultures are negative  IgE 08/27/16- 902  PFT 09/22/16 FVC 2.89 (74%), FEV1 2.36 (72%), F/F 82 ,TLC 84%, DLCO 87% Minimal obstructive, small airways disease  Bronch 10/08/16 Micro, path is negative. Flow cytometry not done Cell count WBC 485, L 22%, Eos 20%, neuts 31%, Mon macrophage 27%  Assessment:  Severe persistent asthma Eosinophilic PNA Workup for  interstitial lung disease, hypersensitivity panel is negative.  Differential diagnosis are uncontrolled asthma, eosinophilic pneumonias. Her BAL shows high eosinophil count. Although the 20% in BAL does not reach the formal criteria for eosinophilic pneumonia, we can still give her the diagnosis as she got off prednisone just about 5 days before the  procedure.   Continue on slow prednisone taper.  Reduce dose to 5 mg and then by 2.5 mg every month She was previously on nucala.  This has been changed to dupixent which appears to be working well.  Active smoker She continues to smoke half pack per day which is contributing to her symptoms Reiterated that she needs to quit as soon as possible.  Currently on Wellbutrin per her primary care  Plan/Recommendations: - Continue breo and incruse - Reduce prednisone to 5 mg and continue slow taper - Calcium, vitamin D supplementation - Continue dupixent - Smoking cessation  Return to clinic in 2 month.  Marshell Garfinkel MD [facility] Pager [phone] 07/28/2017, 4:41 PM  CC: Lawerance Cruel, MD

## 2017-08-11 NOTE — Telephone Encounter (Signed)
Prednisone ordered Nothing further needed; will sign off

## 2017-09-13 ENCOUNTER — Telehealth: Payer: Self-pay | Admitting: *Deleted

## 2017-09-13 NOTE — Telephone Encounter (Signed)
Spoke with pt's husband, Nate (on HIPPA) and asked that he have pt. call our office to r/s her 10/14/17 appt. with Dr. Epimenio Foot.  He will be out of the office for the first couple of hours that day, but should be back in around 10am.  We can move her appt. to 10 that day if she would like/fim

## 2017-09-13 NOTE — Telephone Encounter (Signed)
VM full/fim 

## 2017-09-13 NOTE — Telephone Encounter (Signed)
work# with identified vm of someone named Kara [name]; not on pt's HIPPA, so no message left/fim

## 2017-09-20 ENCOUNTER — Other Ambulatory Visit: Payer: Self-pay | Admitting: Pulmonary Disease

## 2017-09-20 ENCOUNTER — Other Ambulatory Visit: Payer: Self-pay | Admitting: Cardiovascular Disease

## 2017-09-20 MED ORDER — FLUTICASONE FUROATE-VILANTEROL 200-25 MCG/INH IN AEPB: 1.0000 | INHALATION_SPRAY | Freq: Every day | RESPIRATORY_TRACT | 6 refills | Status: DC

## 2017-09-20 NOTE — Telephone Encounter (Signed)
Received refill request for Breo 200 from [facility]. Rx has been sent to preferred pharmacy. Nothing further is needed.

## 2017-09-21 ENCOUNTER — Telehealth: Payer: Self-pay | Admitting: Pulmonary Disease

## 2017-09-21 MED ORDER — DOXYCYCLINE HYCLATE 100 MG PO TABS: 100.0000 mg | ORAL_TABLET | Freq: Two times a day (BID) | ORAL | 0 refills | Status: DC

## 2017-09-21 NOTE — Telephone Encounter (Signed)
Spoke with Kara [name], she states she is coughing with green mucus for a couple of days. She states it is getting harder to breathe and she feels she may have an infection. She is using more breathing treatments and took OTC Tylenol PM and this only helped her sleep. She is wheezing but denies any chest pain. She states Doxycycline is usually what helps and prevents her from getting worse. Dr. Isaiah SergeMannam please advise.   [facility].  Current Outpatient Medications on File Prior to Visit  Medication Sig Dispense Refill  . albuterol (PROVENTIL) (2.5 MG/3ML) 0.083% nebulizer solution INHALE THE CONTENTS OF 1VIAL VIA NEBULIZER Q 4-6 H PRN  5  . buPROPion (WELLBUTRIN XL) 150 MG 24 hr tablet Take 150 mg by mouth daily.    . calcium-vitamin D (OSCAL WITH D) 500-200 MG-UNIT tablet Take 1 tablet by mouth 2 (two) times daily. 30 tablet 3  . chlorhexidine (PERIDEX) 0.12 % solution Use as directed 15 mLs in the mouth or throat 2 (two) times daily. After meals 2700 mL 1  . Cholecalciferol (VITAMIN D PO) Take 1 tablet by mouth daily.     . DUPIXENT 300 MG/2ML SOSY SHIP FIRST:INJECT 2 SYRINGES SUBCUTANEOUSLY ON DAY 1, THEN INJECT 1 SYRINGE ON DAY 15 , THEN 1 SYRINGE EVERY OTHER WEEK THEREAFTER. 2 Syringe 10  . EPINEPHrine (EPIPEN 2-PAK) 0.3 mg/0.3 mL IJ SOAJ injection Inject 0.3 mg into the muscle once as needed (for severe allergic reaction).    . fluticasone furoate-vilanterol (BREO ELLIPTA) 200-25 MCG/INH AEPB Inhale 1 puff into the lungs daily. 60 each 6  . furosemide (LASIX) 20 MG tablet TAKE 1 TABLET(20 MG) BY MOUTH DAILY 90 tablet 1  . gabapentin (NEURONTIN) 300 MG capsule Take 3 capsules (900 mg total) by mouth 2 (two) times daily. 540 capsule 4  . ipratropium-albuterol (DUONEB) 0.5-2.5 (3) MG/3ML SOLN USE 1 VIAL PER NEBULIZER EVERY 6 HOURS  FOR 30 DAYS  6  . Melatonin 10 MG TABS Take 10 mg by mouth at bedtime.    . metoprolol succinate (TOPROL-XL) 50 MG 24 hr tablet TAKE 1 TABLET BY MOUTH DAILY WITH OR  IMMEDIATELY FOLLOWING A MEAL 90 tablet 1  . montelukast (SINGULAIR) 10 MG tablet TAKE 1 TABLET BY MOUTH DAILY( OFFICE VISIT NEEDED FOR FURTHER REFILLS) 30 tablet 5  . Multiple Vitamins-Minerals (MULTIVITAMIN WITH MINERALS) tablet Take 1 tablet by mouth daily.    . naltrexone (DEPADE) 50 MG tablet Take 50 mg by mouth daily.    . nicotine (NICODERM CQ - DOSED IN MG/24 HOURS) 21 mg/24hr patch Place 1 patch (21 mg total) onto the skin daily. 90 patch 0  . omeprazole (PRILOSEC) 40 MG capsule TAKE 1 CAPSULE(40 MG) BY MOUTH DAILY 90 capsule 3  . predniSONE (DELTASONE) 5 MG tablet 5 mg for one month then 2.5 for one month 30 tablet 1  . PROAIR HFA 108 (90 Base) MCG/ACT inhaler INHALE 2 PUFFS INTO THE LUNGS EVERY 6 HOURS AS NEEDED FOR WHEEZING OR SHORTNESS OF BREATH 8.5 g 0  . QUEtiapine (SEROQUEL) 50 MG tablet Take 100 mg by mouth at bedtime.     . rosuvastatin (CRESTOR) 10 MG tablet Take 10 mg by mouth daily.    . sertraline (ZOLOFT) 50 MG tablet Take 75 mg by mouth daily.    Marland Kitchen. thiamine 100 MG tablet Take 100 mg by mouth daily.    . traMADol (ULTRAM) 50 MG tablet Take 50 mg by mouth 3 (three) times daily.  0  .  umeclidinium bromide (INCRUSE ELLIPTA) 62.5 MCG/INH AEPB Inhale 1 puff into the lungs daily. 30 each 6   No current facility-administered medications on file prior to visit.    Allergies  Allergen Reactions  . Peanuts [Peanut Oil] Hives and Itching

## 2017-09-21 NOTE — Telephone Encounter (Signed)
OK to call in doxy 100 mg bid for 7 days

## 2017-09-21 NOTE — Telephone Encounter (Signed)
Called pt and advised message from the provider. Pt understood and verbalized understanding. Nothing further is needed.    Rx sent in. 

## 2017-10-10 ENCOUNTER — Ambulatory Visit: Payer: BC Managed Care – PPO | Admitting: Pulmonary Disease

## 2017-10-14 ENCOUNTER — Ambulatory Visit: Payer: Self-pay | Admitting: Neurology

## 2017-10-14 ENCOUNTER — Ambulatory Visit: Payer: BC Managed Care – PPO | Admitting: Neurology

## 2017-10-28 ENCOUNTER — Ambulatory Visit: Payer: BC Managed Care – PPO | Admitting: Pulmonary Disease

## 2017-10-29 ENCOUNTER — Other Ambulatory Visit: Payer: Self-pay | Admitting: Allergy and Immunology

## 2017-11-01 ENCOUNTER — Ambulatory Visit: Payer: BC Managed Care – PPO | Admitting: Pulmonary Disease

## 2017-11-04 ENCOUNTER — Ambulatory Visit: Payer: BC Managed Care – PPO | Admitting: Primary Care

## 2017-11-06 ENCOUNTER — Other Ambulatory Visit: Payer: Self-pay | Admitting: Pulmonary Disease

## 2017-11-11 ENCOUNTER — Encounter: Payer: Self-pay | Admitting: Primary Care

## 2017-11-11 ENCOUNTER — Ambulatory Visit: Payer: BC Managed Care – PPO | Admitting: Primary Care

## 2017-11-11 DIAGNOSIS — Z720 Tobacco use: Secondary | ICD-10-CM

## 2017-11-11 DIAGNOSIS — J4550 Severe persistent asthma, uncomplicated: Secondary | ICD-10-CM | POA: Insufficient documentation

## 2017-11-11 MED ORDER — DOXYCYCLINE HYCLATE 100 MG PO TABS: 100.0000 mg | ORAL_TABLET | Freq: Two times a day (BID) | ORAL | 0 refills | Status: DC

## 2017-11-11 NOTE — Patient Instructions (Signed)
FU in 3 months with Dr. Isaiah SergeMannam  Send RX for doxy, only take if needed and please let our office know if you required abx   [place] And stop smoking :))

## 2017-11-11 NOTE — Assessment & Plan Note (Signed)
-  Discussed smoking cessation at length, urged her to speak with husband about quitting

## 2017-11-11 NOTE — Assessment & Plan Note (Signed)
-  Well-controlled Breo and Incruse -Occasional exacerbations responds well to doxycycline -Rx given to take while traveling in case she gets sick -Follow up in 3 months with Dr. Isaiah SergeMannam

## 2017-11-11 NOTE — Progress Notes (Signed)
@Patient  ID: Kara [name], female    DOB: November 22, 1985, 33 y.o.   MRN: [id number]  Chief Complaint  Patient presents with  . Follow-up    asthma doing well on Breo, off prednisone    Referring provider: [name], MD  HPI: 32 year old female, current smoker.  Patient Dr. [name], last seen on July 28, 2017.  History of chronic diastolic heart failure, hypertension, asthma, respiratory failure with hypoxia, GERD, alcohol abuse, pancreatitis, acute kidney injury, obesity.  Patient called office in June with complaints of colored sputum, she received doxycycline course and improved.  11/11/2017  Patient presents today for 74-month follow-up. States that she feels good, her breathing is controlled on Breo and Incruse. Continues on Dupixent, states that this is working well for her. Traveling to [place] and asking for abx incase she gets sick.  Still smoking 6 to 8 cigarettes a day. Patient's husband smokes and is hard for her to quit with him actively smoking. Denies fever, chills, shortness of breath, wheeze, cough.    Allergies  Allergen Reactions  . Peanuts [Peanut Oil] Hives and Itching    Immunization History  Administered Date(s) Administered  . Influenza Split 02/02/2016  . Influenza,inj,Quad PF,6+ Mos 12/21/2016  . Influenza-Unspecified 02/28/2015  . Pneumococcal Polysaccharide-23 04/09/2014    Past Medical History:  Diagnosis Date  . Alcohol abuse   . Asthma   . Environmental allergies   . GERD (gastroesophageal reflux disease)   . Headache   . Hypertension   . Pancreatitis   . Prolonged QT syndrome   . Syncope and collapse   . Vision abnormalities     Tobacco History: Social History   Tobacco Use  Smoking Status Current Every Day Smoker  . Packs/day: 0.50  . Years: 5.00  . Pack years: 2.50  . Types: Cigarettes  Smokeless Tobacco Never Used  Tobacco Comment   Pt states that she is trying to quit. Is currently wearing nicotine patch.   Ready to  quit: Not Answered Counseling given: Not Answered Comment: Pt states that she is trying to quit. Is currently wearing nicotine patch.   Outpatient Medications Prior to Visit  Medication Sig Dispense Refill  . albuterol (PROVENTIL) (2.5 MG/3ML) 0.083% nebulizer solution INHALE THE CONTENTS OF 1VIAL VIA NEBULIZER Q 4-6 H PRN  5  . buPROPion (WELLBUTRIN XL) 150 MG 24 hr tablet Take 150 mg by mouth daily.    . calcium-vitamin D (OSCAL WITH D) 500-200 MG-UNIT tablet Take 1 tablet by mouth 2 (two) times daily. 30 tablet 3  . chlorhexidine (PERIDEX) 0.12 % solution Use as directed 15 mLs in the mouth or throat 2 (two) times daily. After meals 2700 mL 1  . Cholecalciferol (VITAMIN D PO) Take 1 tablet by mouth daily.     . DUPIXENT 300 MG/2ML SOSY SHIP FIRST:INJECT 2 SYRINGES SUBCUTANEOUSLY ON DAY 1, THEN INJECT 1 SYRINGE ON DAY 15 , THEN 1 SYRINGE EVERY OTHER WEEK THEREAFTER. 2 Syringe 10  . EPINEPHrine (EPIPEN 2-PAK) 0.3 mg/0.3 mL IJ SOAJ injection Inject 0.3 mg into the muscle once as needed (for severe allergic reaction).    . fluticasone furoate-vilanterol (BREO ELLIPTA) 200-25 MCG/INH AEPB Inhale 1 puff into the lungs daily. 60 each 6  . furosemide (LASIX) 20 MG tablet TAKE 1 TABLET(20 MG) BY MOUTH DAILY 90 tablet 1  . gabapentin (NEURONTIN) 300 MG capsule Take 3 capsules (900 mg total) by mouth 2 (two) times daily. 540 capsule 4  . ipratropium-albuterol (DUONEB) 0.5-2.5 (3) MG/3ML  SOLN USE 1 VIAL PER NEBULIZER EVERY 6 HOURS  FOR 30 DAYS  6  . Melatonin 10 MG TABS Take 10 mg by mouth at bedtime.    . metoprolol succinate (TOPROL-XL) 50 MG 24 hr tablet TAKE 1 TABLET BY MOUTH DAILY WITH OR IMMEDIATELY FOLLOWING A MEAL 90 tablet 1  . montelukast (SINGULAIR) 10 MG tablet TAKE 1 TABLET BY MOUTH DAILY( OFFICE VISIT NEEDED FOR FURTHER REFILLS) 30 tablet 0  . Multiple Vitamins-Minerals (MULTIVITAMIN WITH MINERALS) tablet Take 1 tablet by mouth daily.    . naltrexone (DEPADE) 50 MG tablet Take 50 mg by  mouth daily.    Marland Kitchen. omeprazole (PRILOSEC) 40 MG capsule TAKE 1 CAPSULE(40 MG) BY MOUTH DAILY 90 capsule 3  . PROAIR HFA 108 (90 Base) MCG/ACT inhaler INHALE 2 PUFFS INTO THE LUNGS EVERY 6 HOURS AS NEEDED FOR WHEEZING OR SHORTNESS OF BREATH 8.5 g 0  . QUEtiapine (SEROQUEL) 50 MG tablet Take 100 mg by mouth at bedtime.     . rosuvastatin (CRESTOR) 10 MG tablet Take 10 mg by mouth daily.    . sertraline (ZOLOFT) 50 MG tablet Take 75 mg by mouth daily.    Marland Kitchen. thiamine 100 MG tablet Take 100 mg by mouth daily.    . traMADol (ULTRAM) 50 MG tablet Take 50 mg by mouth 3 (three) times daily.  0  . umeclidinium bromide (INCRUSE ELLIPTA) 62.5 MCG/INH AEPB Inhale 1 puff into the lungs daily. 30 each 6  . doxycycline (VIBRA-TABS) 100 MG tablet Take 1 tablet (100 mg total) by mouth 2 (two) times daily. 14 tablet 0  . predniSONE (DELTASONE) 5 MG tablet 5 mg for one month then 2.5 for one month 30 tablet 1  . predniSONE (DELTASONE) 5 MG tablet TAKE 1 TABLET(5 MG) BY MOUTH DAILY WITH BREAKFAST 30 tablet 0   No facility-administered medications prior to visit.       Review of Systems  Review of Systems  Constitutional: Negative.   HENT: Negative.   Respiratory: Negative.   Cardiovascular: Negative.   Gastrointestinal: Negative.   Skin: Negative.   Allergic/Immunologic: Negative.   Neurological: Negative.   Psychiatric/Behavioral: Negative.     Physical Exam  BP 124/76 (BP Location: Right Arm, Cuff Size: Large)   Pulse 97   Ht 5\' 5"  (1.651 m)   Wt (!) 300 lb 6.4 oz (136.3 kg)   SpO2 97%   BMI 49.99 kg/m  Physical Exam  Constitutional: She is oriented to person, place, and time. She appears well-developed and well-nourished.  HENT:  Head: Normocephalic and atraumatic.  Eyes: Pupils are equal, round, and reactive to light. EOM are normal.  Neck: Normal range of motion. Neck supple.  Cardiovascular: Normal rate, regular rhythm and normal heart sounds.  No murmur heard. Pulmonary/Chest: Effort  normal and breath sounds normal. No respiratory distress. She has no wheezes.  Abdominal: Soft. Bowel sounds are normal. There is no tenderness.  Neurological: She is alert and oriented to person, place, and time.  Skin: Skin is warm and dry. No rash noted. No erythema.  Psychiatric: She has a normal mood and affect. Her behavior is normal. Judgment normal.     Lab Results:  CBC    Component Value Date/Time   WBC 16.0 (H) 06/20/2017 1418   WBC 12.2 (H) 09/01/2016 0547   RBC 4.98 06/20/2017 1418   RBC 4.64 09/01/2016 0547   HGB 14.1 06/20/2017 1418   HCT 41.0 06/20/2017 1418   PLT 230 06/20/2017 1418  MCV 82 06/20/2017 1418   MCH 28.3 06/20/2017 1418   MCH 26.1 09/01/2016 0547   MCHC 34.4 06/20/2017 1418   MCHC 30.9 09/01/2016 0547   RDW 15.3 06/20/2017 1418   LYMPHSABS 2.0 06/20/2017 1418   MONOABS 1.0 09/01/2016 0547   EOSABS 0.1 06/20/2017 1418   BASOSABS 0.1 06/20/2017 1418    BMET    Component Value Date/Time   NA 139 10/15/2016 0825   K 4.3 10/15/2016 0825   CL 97 10/15/2016 0825   CO2 24 10/15/2016 0825   GLUCOSE 103 (H) 10/15/2016 0825   GLUCOSE 139 (H) 09/01/2016 0547   BUN 11 10/15/2016 0825   CREATININE 0.84 10/15/2016 0825   CALCIUM 9.8 10/15/2016 0825   GFRNONAA 92 10/15/2016 0825   GFRAA 106 10/15/2016 0825    BNP    Component Value Date/Time   BNP 21.7 06/12/2016 2136    ProBNP No results found for: PROBNP  Imaging: No results found.   Assessment & Plan:   Severe persistent asthma -Well-controlled Breo and Incruse -Occasional exacerbations responds well to doxycycline -Rx given to take while traveling in case she gets sick -Follow up in 3 months with Dr. [name]  Tobacco abuse -Discussed smoking cessation at length, urged her to speak with husband about quitting     Glenford Bayley, NP 11/11/2017

## 2017-11-14 ENCOUNTER — Ambulatory Visit: Payer: BC Managed Care – PPO | Admitting: Neurology

## 2017-11-15 ENCOUNTER — Encounter: Payer: Self-pay | Admitting: Neurology

## 2017-11-15 ENCOUNTER — Other Ambulatory Visit: Payer: Self-pay | Admitting: Pulmonary Disease

## 2017-11-15 MED ORDER — UMECLIDINIUM BROMIDE 62.5 MCG/INH IN AEPB: 1.0000 | INHALATION_SPRAY | Freq: Every day | RESPIRATORY_TRACT | 6 refills | Status: DC

## 2017-11-15 NOTE — Telephone Encounter (Signed)
Received Rx request for Incruse from [facility]. Rx has been sent to preferred pharmacy. Nothing further is needed.

## 2017-11-24 ENCOUNTER — Other Ambulatory Visit: Payer: Self-pay | Admitting: Allergy and Immunology

## 2017-12-12 ENCOUNTER — Other Ambulatory Visit: Payer: Self-pay | Admitting: Neurology

## 2017-12-23 ENCOUNTER — Encounter: Payer: Self-pay | Admitting: Neurology

## 2017-12-23 ENCOUNTER — Ambulatory Visit: Payer: BC Managed Care – PPO | Admitting: Neurology

## 2017-12-23 VITALS — BP 113/77 | HR 91 | Ht 65.0 in | Wt 296.5 lb

## 2017-12-23 DIAGNOSIS — G629 Polyneuropathy, unspecified: Secondary | ICD-10-CM

## 2017-12-23 DIAGNOSIS — R200 Anesthesia of skin: Secondary | ICD-10-CM

## 2017-12-23 DIAGNOSIS — F1011 Alcohol abuse, in remission: Secondary | ICD-10-CM

## 2017-12-23 DIAGNOSIS — G4700 Insomnia, unspecified: Secondary | ICD-10-CM

## 2017-12-23 DIAGNOSIS — G8929 Other chronic pain: Secondary | ICD-10-CM

## 2017-12-23 DIAGNOSIS — M545 Low back pain: Secondary | ICD-10-CM | POA: Diagnosis not present

## 2017-12-23 DIAGNOSIS — R260 Ataxic gait: Secondary | ICD-10-CM | POA: Diagnosis not present

## 2017-12-23 MED ORDER — GABAPENTIN 300 MG PO CAPS: 900.0000 mg | ORAL_CAPSULE | Freq: Two times a day (BID) | ORAL | 11 refills | Status: DC

## 2017-12-23 NOTE — Progress Notes (Signed)
[facility]  PATIENT: Kara [name] DOB: [birth year]  REFERRING DOCTOR OR PCP:  Gildardo Cranker SOURCE: Patient and EMR / ED records.     _________________________________   HISTORICAL  CHIEF COMPLAINT:  Chief Complaint  Patient presents with  . Polyneuropathy    Feels she is doing well on gabapentin 900mg , BID.  Marland Kitchen RIght arm numbness    New onset of right arm numbness.  Symptoms only present at night.  . Back Pain    She had a work place injury after falling on a recently waxed floor.  She is currently in PT through her Worker's Comp benefits.     HISTORY OF PRESENT ILLNESS:   Kara [name] is a 32 y.o. woman with polyneuropathy and dysesthesias.     Update 12/23/2017: Her foot numbness is about the same in extent but the intensity is better on gabapentin.   She takes 900 mg twice a day.    She notes much less burning and tingling pain, no shooting pain.    She is sleeping better.   Once aslee she is staying asleep.   Her right arm is often asleep, usually when she wakes up.   This usually lasts about 2-3 minutes and completely resolved.      Her back pain improved but flared up recently at work after she slipped and caught herself.  She is doing better with PT.  She is sober almost 1 1/2 years now.   She is doing AA.    From 6/27/21018: Polyneuropathy:   She changed her dose to 900 mg twice a day. She is tolerating that well. She does not feel sleepy during the daytime. Her dysesthetic pain is much better on this dose. She has more pain if she misses several doses.    She tries to exercise more regularly and is eating better.  Polyneuropathy history:   She has a history of alcohol abuse and began to notice difficulty with her balance and walking in March, 2016 after a severe gastroenteritis hospitalization.   She lost 40 pounds with that hospitalization.       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 There is right greater  than left foraminal narrowing. There is no spinal cord abnormality noted.   MRI of the  brain was normal.     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    Other:   She gained weight while in the hospital last month as she was on steroids.    She has had pneumonia multiple times.   Eosinophils have been high prompting the use of steroids. She also has asthma.   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    LBP/neck pain:  She had more more back pain while in the hospital but its not too muc.   Initially, gabapentin helped her pain.    Alcohol history:     She is a recovering alcoholic.   She has no recent alcohol use.   Now she is sober x 6 months.      REVIEW OF SYSTEMS: Constitutional: No fevers, chills, sweats, or change in appetite.   Some insomnia Eyes: No visual changes, double vision, eye pain Ear, nose and throat: No hearing loss, ear pain, nasal congestion, sore throat Cardiovascular: No chest pain, palpitations Respiratory: No shortness of breath at rest or with exertion.  No wheezes GastrointestinaI: as above Genitourinary: No dysuria, urinary retention or frequency.  No nocturia. Musculoskeletal: No neck pain, back pain Integumentary: No rash, pruritus, skin lesions Neurological: as above Psychiatric: No depression at this time.  Some anxiety Endocrine: No palpitations, diaphoresis, change in appetite, change in weigh or increased thirst Hematologic/Lymphatic: No anemia, purpura, petechiae. Allergic/Immunologic: No itchy/runny eyes, nasal congestion, recent allergic reactions, rashes  ALLERGIES: Allergies  Allergen Reactions  . Peanuts [Peanut Oil] Hives and Itching    HOME MEDICATIONS:  Current Outpatient Medications:  .  albuterol (PROVENTIL) (2.5 MG/3ML) 0.083% nebulizer solution, INHALE THE CONTENTS OF 1VIAL VIA NEBULIZER Q 4-6 H PRN, Disp: , Rfl: 5 .   buPROPion (WELLBUTRIN XL) 300 MG 24 hr tablet, Take 1 tablet by mouth daily., Disp: , Rfl: 4 .  calcium-vitamin D (OSCAL WITH D) 500-200 MG-UNIT tablet, Take 1 tablet by mouth 2 (two) times daily., Disp: 30 tablet, Rfl: 3 .  chlorhexidine (PERIDEX) 0.12 % solution, Use as directed 15 mLs in the mouth or throat 2 (two) times daily. After meals, Disp: 2700 mL, Rfl: 1 .  Cholecalciferol (VITAMIN D PO), Take 1 tablet by mouth daily. , Disp: , Rfl:  .  cyclobenzaprine (FLEXERIL) 5 MG tablet, TK 1 TO 2 TS PO QHS PRF MSP OR TIGHTNESS, Disp: , Rfl: 1 .  doxycycline (VIBRA-TABS) 100 MG tablet, Take 1 tablet (100 mg total) by mouth 2 (two) times daily., Disp: 14 tablet, Rfl: 0 .  DUPIXENT 300 MG/2ML SOSY, SHIP FIRST:INJECT 2 SYRINGES SUBCUTANEOUSLY ON DAY 1, THEN INJECT 1 SYRINGE ON DAY 15 , THEN 1 SYRINGE EVERY OTHER WEEK THEREAFTER., Disp: 2 Syringe, Rfl: 10 .  EPINEPHrine (EPIPEN 2-PAK) 0.3 mg/0.3 mL IJ SOAJ injection, Inject 0.3 mg into the muscle once as needed (for severe allergic reaction)., Disp: , Rfl:  .  fluticasone furoate-vilanterol (BREO ELLIPTA) 200-25 MCG/INH AEPB, Inhale 1 puff into the lungs daily., Disp: 60 each, Rfl: 6 .  furosemide (LASIX) 20 MG tablet, TAKE 1 TABLET(20 MG) BY MOUTH DAILY, Disp: 90 tablet, Rfl: 1 .  gabapentin (NEURONTIN) 300 MG capsule, TAKE 3 CAPSULES(900 MG) BY MOUTH TWICE DAILY, Disp: 180 capsule, Rfl: 0 .  ipratropium-albuterol (DUONEB) 0.5-2.5 (3) MG/3ML SOLN, USE 1 VIAL PER NEBULIZER EVERY 6 HOURS  FOR 30 DAYS, Disp: , Rfl: 6 .  Melatonin 10 MG TABS, Take 10 mg by mouth at bedtime., Disp: , Rfl:  .  metoprolol succinate (TOPROL-XL) 50 MG 24 hr tablet, TAKE 1 TABLET BY MOUTH DAILY WITH OR IMMEDIATELY FOLLOWING A MEAL, Disp: 90 tablet, Rfl: 1 .  montelukast (SINGULAIR) 10 MG tablet, TAKE 1 TABLET BY MOUTH DAILY( OFFICE VIST NEEDED FOR FUTHER REFILLS), Disp: 30 tablet, Rfl: 0 .  Multiple Vitamins-Minerals (MULTIVITAMIN WITH MINERALS) tablet, Take 1 tablet by mouth  daily., Disp: , Rfl:  .  naltrexone (DEPADE) 50 MG tablet, Take 50 mg by mouth daily., Disp: , Rfl:  .  omeprazole (PRILOSEC) 40 MG capsule, TAKE 1 CAPSULE(40 MG) BY MOUTH DAILY, Disp: 90 capsule, Rfl: 3 .  PROAIR HFA 108 (90 Base) MCG/ACT inhaler, INHALE 2 PUFFS INTO THE LUNGS EVERY 6 HOURS AS NEEDED FOR WHEEZING OR SHORTNESS OF BREATH, Disp: 8.5 g, Rfl: 0 .  QUEtiapine (SEROQUEL) 50 MG tablet, Take 100 mg by mouth at bedtime. , Disp: , Rfl:  .  rosuvastatin (CRESTOR) 10 MG tablet, Take 10 mg by mouth daily., Disp: , Rfl:  .  sertraline (ZOLOFT) 50 MG tablet, Take 75 mg by mouth daily.,  Disp: , Rfl:  .  thiamine 100 MG tablet, Take 100 mg by mouth daily., Disp: , Rfl:  .  traMADol (ULTRAM) 50 MG tablet, Take 50 mg by mouth 3 (three) times daily., Disp: , Rfl: 0 .  umeclidinium bromide (INCRUSE ELLIPTA) 62.5 MCG/INH AEPB, Inhale 1 puff into the lungs daily., Disp: 30 each, Rfl: 6  PAST MEDICAL HISTORY: Past Medical History:  Diagnosis Date  . Alcohol abuse   . Asthma   . Environmental allergies   . GERD (gastroesophageal reflux disease)   . Headache   . Hypertension   . Pancreatitis   . Prolonged QT syndrome   . Syncope and collapse   . Vision abnormalities     PAST SURGICAL HISTORY: Past Surgical History:  Procedure Laterality Date  . ESOPHAGOGASTRODUODENOSCOPY N/A 07/17/2014   Procedure: ESOPHAGOGASTRODUODENOSCOPY (EGD);  Surgeon: Dorena Cookey, MD;  Location: [facility];  Service: Endoscopy;  Laterality: N/A;  . TYMPANOSTOMY TUBE PLACEMENT    . VIDEO BRONCHOSCOPY Bilateral 10/08/2016   Procedure: VIDEO BRONCHOSCOPY WITH FLUORO;  Surgeon: Chilton Greathouse, MD;  Location: [facility];  Service: Cardiopulmonary;  Laterality: Bilateral;    FAMILY HISTORY: Family History  Problem Relation Age of Onset  . Heart failure Mother   . Heart failure Father   . Diabetes Father   . Heart failure Maternal Grandfather     SOCIAL HISTORY:  Social History   Socioeconomic History  .  Marital status: Married    Spouse name: [name]  . Number of children: 0  . Years of education: Not on file  . Highest education level: Not on file  Occupational History  . Occupation: Engineer, production  Social Needs  . Financial resource strain: Not on file  . Food insecurity:    Worry: Not on file    Inability: Not on file  . Transportation needs:    Medical: Not on file    Non-medical: Not on file  Tobacco Use  . Smoking status: Current Every Day Smoker    Packs/day: 0.50    Years: 5.00    Pack years: 2.50    Types: Cigarettes  . Smokeless tobacco: Never Used  . Tobacco comment: Pt states that she is trying to quit. Is currently wearing nicotine patch.  Substance and Sexual Activity  . Alcohol use: No    Alcohol/week: 3.0 standard drinks    Types: 3 Shots of liquor per week  . Drug use: No  . Sexual activity: Yes    Birth control/protection: None  Lifestyle  . Physical activity:    Days per week: Not on file    Minutes per session: Not on file  . Stress: Not on file  Relationships  . Social connections:    Talks on phone: Not on file    Gets together: Not on file    Attends religious service: Not on file    Active member of club or organization: Not on file    Attends meetings of clubs or organizations: Not on file    Relationship status: Not on file  . Intimate partner violence:    Fear of current or ex partner: Not on file    Emotionally abused: Not on file    Physically abused: Not on file    Forced sexual activity: Not on file  Other Topics Concern  . Not on file  Social History Narrative   Married.     PHYSICAL EXAM  Vitals:   12/23/17 0902  BP: 113/77  Pulse: 91  Weight: 296 lb 8 oz (134.5 kg)  Height: 5\' 5"  (1.651 m)    Body mass index is 49.34 kg/m.   General: The patient is well-developed and well-nourished and in no acute distress   Musculoskeletal/Ext: She has mild lumbar paraspinal tenderness.  She has mild edema at the  ankles.  Neurologic Exam  Mental status: She is alert and oriented 3. Remote and recent memory is normal. She appears to have a normal attention span  Speech is normal.  Cranial nerves: Extraocular movements are full.  Facial strength and sensation was normal.  Trapezius strength was normal.  . No dysarthria is noted.    Motor:  Muscle bulk is normal.   Tone is normal. Strength is  5 / 5 in all 4 extremities.   Sensory: Currently, she has intact sensation to touch, temperature and vibration in the legs.  This is improved compared to her initial visit  Gait and station: Station is normal.   Gait and tandem gait are normal.  Reflexes: Deep tendon reflexes are symmetric and normal bilaterally.      DIAGNOSTIC DATA (LABS, IMAGING, TESTING) - I reviewed patient records, labs, notes, testing and imaging myself where available.  Lab Results  Component Value Date   WBC 16.0 (H) 06/20/2017   HGB 14.1 06/20/2017   HCT 41.0 06/20/2017   MCV 82 06/20/2017   PLT 230 06/20/2017      Component Value Date/Time   NA 139 10/15/2016 0825   K 4.3 10/15/2016 0825   CL 97 10/15/2016 0825   CO2 24 10/15/2016 0825   GLUCOSE 103 (H) 10/15/2016 0825   GLUCOSE 139 (H) 09/01/2016 0547   BUN 11 10/15/2016 0825   CREATININE 0.84 10/15/2016 0825   CALCIUM 9.8 10/15/2016 0825   PROT 6.1 (L) 09/01/2016 0547   PROT 7.6 09/02/2014 1507   ALBUMIN 3.0 (L) 09/01/2016 0547   ALBUMIN 4.1 09/02/2014 1507   AST 13 (L) 09/01/2016 0547   ALT 13 (L) 09/01/2016 0547   ALKPHOS 55 09/01/2016 0547   BILITOT 0.3 09/01/2016 0547   BILITOT 0.7 09/02/2014 1507   GFRNONAA 92 10/15/2016 0825   GFRAA 106 10/15/2016 0825    Lab Results  Component Value Date   VITAMINB12 632 09/02/2014   Lab Results  Component Value Date   TSH 0.219 (L) 06/13/2016       ASSESSMENT AND PLAN  Polyneuropathy  Numbness of foot  Ataxic gait  Chronic low back pain, unspecified back pain laterality, with sciatica presence  unspecified  Insomnia, unspecified type    1.   Continue gabapentin 900 mg twice a day.  Prescription was renewed. 2,   stay active and exercise as tolerated.  Try to lose weight. 3.  She will return to see me in 1 year or sooner if there are new or worsening neurologic symptoms.  Richard A. Epimenio Foot, MD, PhD, [place]    12/23/2017, 9:10 AM Certified in Neurology, Clinical Neurophysiology, Sleep Medicine, Pain Medicine and Neuroimaging  [facility] [address], [address] [place], [place] [address] [phone]

## 2018-01-05 ENCOUNTER — Other Ambulatory Visit: Payer: Self-pay | Admitting: Neurology

## 2018-01-20 ENCOUNTER — Other Ambulatory Visit: Payer: Self-pay | Admitting: Allergy and Immunology

## 2018-01-22 ENCOUNTER — Other Ambulatory Visit: Payer: Self-pay | Admitting: Pulmonary Disease

## 2018-02-19 ENCOUNTER — Other Ambulatory Visit: Payer: Self-pay | Admitting: Cardiovascular Disease

## 2018-02-21 ENCOUNTER — Other Ambulatory Visit: Payer: Self-pay | Admitting: Family Medicine

## 2018-02-21 DIAGNOSIS — R748 Abnormal levels of other serum enzymes: Secondary | ICD-10-CM

## 2018-02-22 ENCOUNTER — Other Ambulatory Visit: Payer: Self-pay | Admitting: Allergy and Immunology

## 2018-02-22 ENCOUNTER — Telehealth: Payer: Self-pay | Admitting: *Deleted

## 2018-02-22 NOTE — Telephone Encounter (Signed)
L/M for patient to contact office to make appt for renewal for Dupixent. Last seen prior to start of therapy in Feb. 2019 was due back in Aug

## 2018-03-06 ENCOUNTER — Other Ambulatory Visit: Payer: Self-pay | Admitting: Cardiovascular Disease

## 2018-03-15 ENCOUNTER — Other Ambulatory Visit: Payer: Self-pay | Admitting: Pulmonary Disease

## 2018-03-31 NOTE — Progress Notes (Deleted)
Patient ID: Kara [name], female   DOB: 28-Jul-1985, 32 y.o.   MRN: [id number]     Cardiology Office Note   Date:  03/31/2018   ID:  Kara [name], DOB 33-Aug-1987, MRN [id number]  PCP:  [name], MD  Cardiologist:   Charlton Haws, MD   No chief complaint on file.     History of Present Illness:  32 y.o. f/u palpitations and syncope. First seen in 2015. She is an alcoholic and has had inpatient Rx at [facility].   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 She underwent IR guided embolization of the IMA branches.  Seen by [facility] in June, 22, 2018  with eosinophils Continues on low dose steriods and inhalers HR improved on Toprol 50 mg daily . She is at [employer] working now. Still sober over a year now   ***   Past Medical History:  Diagnosis Date  . Alcohol abuse   . Asthma   . Environmental allergies   . GERD (gastroesophageal reflux disease)   . Headache   . Hypertension   . Pancreatitis   . Prolonged QT syndrome   . Syncope and collapse   . Vision abnormalities     Past Surgical History:  Procedure Laterality Date  . ESOPHAGOGASTRODUODENOSCOPY N/A 07/17/2014   Procedure: ESOPHAGOGASTRODUODENOSCOPY (EGD);  Surgeon: Dorena Cookey, MD;  Location: [facility];  Service: Endoscopy;  Laterality: N/A;  . TYMPANOSTOMY TUBE PLACEMENT    . VIDEO BRONCHOSCOPY Bilateral 10/08/2016   Procedure: VIDEO BRONCHOSCOPY WITH FLUORO;  Surgeon: Chilton Greathouse, MD;  Location: [facility];  Service: Cardiopulmonary;  Laterality: Bilateral;     Current Outpatient Medications  Medication Sig Dispense Refill  . albuterol (PROVENTIL) (2.5 MG/3ML) 0.083% nebulizer solution INHALE THE CONTENTS OF 1VIAL VIA  NEBULIZER Q 4-6 H PRN  5  . BREO ELLIPTA 200-25 MCG/INH AEPB INHALE 1 PUFF INTO THE LUNGS DAILY 60 each 0  . buPROPion (WELLBUTRIN XL) 300 MG 24 hr tablet Take 1 tablet by mouth daily.  4  . calcium-vitamin D (OSCAL WITH D) 500-200 MG-UNIT tablet Take 1 tablet by mouth 2 (two) times daily. 30 tablet 3  . chlorhexidine (PERIDEX) 0.12 % solution Use as directed 15 mLs in the mouth or throat 2 (two) times daily. After meals 2700 mL 1  . Cholecalciferol (VITAMIN D PO) Take 1 tablet by mouth daily.     . cyclobenzaprine (FLEXERIL) 5 MG tablet TK 1 TO 2 TS PO QHS PRF MSP OR TIGHTNESS  1  . doxycycline (VIBRA-TABS) 100 MG tablet Take 1 tablet (100 mg total) by mouth 2 (two) times daily. 14 tablet 0  . DUPIXENT 300 MG/2ML SOSY INJECT 300 MG SUBCUTANEOUSLY EVERY OTHER WEEK 2 Syringe 9  . EPINEPHrine (EPIPEN 2-PAK) 0.3 mg/0.3 mL IJ SOAJ injection Inject 0.3 mg into the muscle once as needed (for severe allergic reaction).    . furosemide (LASIX) 20 MG tablet TAKE 1 TABLET BY MOUTH DAILY 90 tablet 1  . gabapentin (NEURONTIN) 300 MG capsule Take 3 capsules (900 mg total) by mouth 2 (two) times daily. 180 capsule 11  . ipratropium-albuterol (DUONEB) 0.5-2.5 (3) MG/3ML SOLN USE 1 VIAL PER NEBULIZER EVERY 6 HOURS  FOR 30 DAYS  6  . Melatonin 10 MG TABS  Take 10 mg by mouth at bedtime.    . metoprolol succinate (TOPROL-XL) 50 MG 24 hr tablet Take 1 tablet by mouth daily with or immediately following a meal. Please keep upcoming appt with Dr. Eden EmmsNishan in December. Thank you 90 tablet 0  . montelukast (SINGULAIR) 10 MG tablet TAKE 1 TABLET BY MOUTH DAILY( OFFICE VIST NEEDED FOR FUTHER REFILLS) 30 tablet 0  . Multiple Vitamins-Minerals (MULTIVITAMIN WITH MINERALS) tablet Take 1 tablet by mouth daily.    . naltrexone (DEPADE) 50 MG tablet Take 50 mg by mouth daily.    Marland Kitchen. omeprazole (PRILOSEC) 40 MG capsule TAKE 1 CAPSULE(40 MG) BY MOUTH DAILY 90 capsule 3  . PROAIR HFA 108 (90 Base) MCG/ACT inhaler INHALE 2 PUFFS INTO  THE LUNGS EVERY 6 HOURS AS NEEDED FOR WHEEZING OR SHORTNESS OF BREATH 8.5 g 0  . QUEtiapine (SEROQUEL) 50 MG tablet Take 100 mg by mouth at bedtime.     . rosuvastatin (CRESTOR) 10 MG tablet Take 10 mg by mouth daily.    . sertraline (ZOLOFT) 50 MG tablet Take 75 mg by mouth daily.    Marland Kitchen. thiamine 100 MG tablet Take 100 mg by mouth daily.    . traMADol (ULTRAM) 50 MG tablet Take 50 mg by mouth 3 (three) times daily.  0  . umeclidinium bromide (INCRUSE ELLIPTA) 62.5 MCG/INH AEPB Inhale 1 puff into the lungs daily. 30 each 6   No current facility-administered medications for this visit.     Allergies:   Peanuts [peanut oil]    Social History:  The patient  reports that she has been smoking cigarettes. She has a 2.50 pack-year smoking history. She has never used smokeless tobacco. She reports that she does not drink alcohol or use drugs.   Family History:  The patient's family history includes Diabetes in her father; Heart failure in her father, maternal grandfather, and mother.    ROS:  Please see the history of present illness.   Otherwise, review of systems are positive for none.   All other systems are reviewed and negative.    PHYSICAL EXAM: VS:  There were no vitals taken for this visit. , BMI There is no height or weight on file to calculate BMI. Affect appropriate Overweight white female  HEENT: normal Neck supple with no adenopathy JVP normal no bruits no thyromegaly Lungs clear with no wheezing and good diaphragmatic motion Heart:  S1/S2 no murmur, no rub, gallop or click PMI normal Abdomen: benighn, BS positve, no tenderness, no AAA no bruit.  No HSM or HJR Distal pulses intact with no bruits No edema Neuro non-focal Skin warm and dry No muscular weakness     EKG:  12/ 21/15  ST rate 131  QT 434   12/22 /15 SR rate 92  QT 412  Normal  07/28/14    Recent Labs: 06/20/2017: Hemoglobin 14.1; Platelets 230    Lipid Panel    Component Value Date/Time   CHOL 174  09/09/2015 1724   TRIG 87 09/09/2015 1724   HDL 57 09/09/2015 1724   CHOLHDL 3.1 09/09/2015 1724   VLDL 17 09/09/2015 1724   LDLCALC 100 (H) 09/09/2015 1724      Wt Readings from Last 3 Encounters:  12/23/17 296 lb 8 oz (134.5 kg)  11/11/17 (!) 300 lb 6.4 oz (136.3 kg)  07/28/17 (!) 301 lb 9.6 oz (136.8 kg)      Other studies Reviewed: Additional studies/ records that were reviewed today include: Hosptial admission records,  ECG;s 12/15 . ST rate 123 QT 342  Bronch June 2018 Echo 06/13/16    ASSESSMENT AND PLAN:  1.  Syncope:  Etiology not clear. Normal ETT 04/23/15  and Normal EF by echo 06/13/16  Observe  2.  ETOH Abuse  Sober for over a year now   congratulated her  3.  Asthma/pneumonia:  F/u [place] pulmonary Dr [name] continue low dose steroids and inhalers 4.  Reflux:  Discussed low carb diet eating meals earlier and blocks under bed 5.  Edema likely from weight and steroids PRN lasix  6.  Tachycardia: better on higher dose Toporl related to asthma/COPD and obesity  Current medicines are reviewed at length with the patient today.  The patient does not have concerns regarding medicines.  The following changes have been made:  no change  Labs/ tests ordered today include:     No orders of the defined types were placed in this encounter.    Disposition:   FU with me in a year     Signed, Charlton Haws, MD  03/31/2018 9:10 AM    [facility] [address] [place], [place], [place]  [address] Phone: [phone]; Fax: [phone]

## 2018-04-03 ENCOUNTER — Ambulatory Visit: Payer: BC Managed Care – PPO | Admitting: Cardiovascular Disease

## 2018-04-10 ENCOUNTER — Other Ambulatory Visit: Payer: Self-pay | Admitting: Pulmonary Disease

## 2018-04-10 MED ORDER — FLUTICASONE FUROATE-VILANTEROL 200-25 MCG/INH IN AEPB: 1.0000 | INHALATION_SPRAY | Freq: Every day | RESPIRATORY_TRACT | 0 refills | Status: DC

## 2018-04-10 NOTE — Progress Notes (Signed)
Received request for refill of Breo. Refill sent for one month. Patient needs OV follow up. Called patient but unable to reach. Left message to give us a call back.

## 2018-04-13 NOTE — Progress Notes (Signed)
Patient ID: Kara [name], female   DOB: [birth year], 32 y.o.   MRN: [id number]     Cardiology Office Note   Date:  04/17/2018   ID:  Dellar Traber, DOB 1985/08/19, MRN [id number]  PCP:  [name], MD  Cardiologist:   Charlton Haws, MD   No chief complaint on file.     History of Present Illness:  32 y.o. f/u palpitations and syncope. First seen in 2015. She is an alcoholic and has had inpatient Rx at [facility].   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 She underwent IR guided embolization of the IMA branches.  Seen by [facility] in June, 22, 2018  with eosinophils Continues on low dose steriods and inhalers HR improved on Toprol 50 mg daily . She is at [employer] working now. Still sober for almost 2 years now      Past Medical History:  Diagnosis Date  . Alcohol abuse   . Asthma   . Environmental allergies   . GERD (gastroesophageal reflux disease)   . Headache   . Hypertension   . Pancreatitis   . Prolonged QT syndrome   . Syncope and collapse   . Vision abnormalities     Past Surgical History:  Procedure Laterality Date  . ESOPHAGOGASTRODUODENOSCOPY N/A 07/17/2014   Procedure: ESOPHAGOGASTRODUODENOSCOPY (EGD);  Surgeon: Dorena Cookey, MD;  Location: [facility];  Service: Endoscopy;  Laterality: N/A;  . TYMPANOSTOMY TUBE PLACEMENT    . VIDEO BRONCHOSCOPY Bilateral 10/08/2016   Procedure: VIDEO BRONCHOSCOPY WITH FLUORO;  Surgeon: Chilton Greathouse, MD;  Location: [facility];  Service: Cardiopulmonary;  Laterality: Bilateral;     Current Outpatient Medications  Medication Sig Dispense Refill  . albuterol (PROVENTIL) (2.5 MG/3ML) 0.083% nebulizer solution INHALE THE CONTENTS OF 1VIAL  VIA NEBULIZER Q 4-6 H PRN  5  . buPROPion (WELLBUTRIN XL) 300 MG 24 hr tablet Take 1 tablet by mouth daily.  4  . calcium-vitamin D (OSCAL WITH D) 500-200 MG-UNIT tablet Take 1 tablet by mouth 2 (two) times daily. 30 tablet 3  . chlorhexidine (PERIDEX) 0.12 % solution Use as directed 15 mLs in the mouth or throat 2 (two) times daily. After meals 2700 mL 1  . Cholecalciferol (VITAMIN D PO) Take 1 tablet by mouth daily.     . cyclobenzaprine (FLEXERIL) 5 MG tablet TK 1 TO 2 TS PO QHS PRF MSP OR TIGHTNESS  1  . DUPIXENT 300 MG/2ML SOSY INJECT 300 MG SUBCUTANEOUSLY EVERY OTHER WEEK 2 Syringe 9  . EPINEPHrine (EPIPEN 2-PAK) 0.3 mg/0.3 mL IJ SOAJ injection Inject 0.3 mg into the muscle once as needed (for severe allergic reaction).    . fluticasone furoate-vilanterol (BREO ELLIPTA) 200-25 MCG/INH AEPB Inhale 1 puff into the lungs daily. Patient needs appointment for further refills. 30 each 0  . furosemide (LASIX) 20 MG tablet TAKE 1 TABLET BY MOUTH DAILY 90 tablet 1  . gabapentin (NEURONTIN) 300 MG capsule Take 3 capsules (900 mg total) by mouth 2 (two) times daily. 180 capsule 11  . ipratropium-albuterol (DUONEB) 0.5-2.5 (3) MG/3ML SOLN USE 1 VIAL PER NEBULIZER EVERY 6 HOURS  FOR 30 DAYS  6  . Melatonin 10 MG TABS Take 10 mg by mouth at bedtime.    . metoprolol succinate (  TOPROL-XL) 50 MG 24 hr tablet Take 1 tablet by mouth daily with or immediately following a meal. Please keep upcoming appt with Dr. Eden EmmsNishan in December. Thank you 90 tablet 0  . montelukast (SINGULAIR) 10 MG tablet TAKE 1 TABLET BY MOUTH DAILY( OFFICE VIST NEEDED FOR FUTHER REFILLS) 30 tablet 0  . Multiple Vitamins-Minerals (MULTIVITAMIN WITH MINERALS) tablet Take 1 tablet by mouth daily.    . naltrexone (DEPADE) 50 MG tablet Take 50 mg by mouth daily.    Marland Kitchen. omeprazole (PRILOSEC) 40 MG capsule TAKE 1 CAPSULE(40 MG) BY MOUTH DAILY 90 capsule 3  . PROAIR HFA 108 (90 Base) MCG/ACT inhaler INHALE 2 PUFFS INTO THE LUNGS EVERY 6 HOURS AS  NEEDED FOR WHEEZING OR SHORTNESS OF BREATH 8.5 g 0  . QUEtiapine (SEROQUEL) 50 MG tablet Take 100 mg by mouth at bedtime.     . rosuvastatin (CRESTOR) 10 MG tablet Take 10 mg by mouth daily.    . sertraline (ZOLOFT) 50 MG tablet Take 75 mg by mouth daily.    Marland Kitchen. thiamine 100 MG tablet Take 100 mg by mouth daily.    . traMADol (ULTRAM) 50 MG tablet Take 50 mg by mouth 3 (three) times daily.  0  . umeclidinium bromide (INCRUSE ELLIPTA) 62.5 MCG/INH AEPB Inhale 1 puff into the lungs daily. 30 each 6   No current facility-administered medications for this visit.     Allergies:   Peanuts [peanut oil]    Social History:  The patient  reports that she has been smoking cigarettes. She has a 2.50 pack-year smoking history. She has never used smokeless tobacco. She reports that she does not drink alcohol or use drugs.   Family History:  The patient's family history includes Diabetes in her father; Heart failure in her father, maternal grandfather, and mother.    ROS:  Please see the history of present illness.   Otherwise, review of systems are positive for none.   All other systems are reviewed and negative.    PHYSICAL EXAM: VS:  BP 110/78 (BP Location: Left Arm, Patient Position: Sitting, Cuff Size: Large)   Pulse 76   Ht 5\' 5"  (1.651 m)   Wt 281 lb 12.8 oz (127.8 kg)   BMI 46.89 kg/m  , BMI Body mass index is 46.89 kg/m. Affect appropriate Obese female  HEENT: normal Neck supple with no adenopathy JVP normal no bruits no thyromegaly Lungs clear with no wheezing and good diaphragmatic motion Heart:  S1/S2 no murmur, no rub, gallop or click PMI normal Abdomen: benighn, BS positve, no tenderness, no AAA no bruit.  No HSM or HJR Distal pulses intact with no bruits No edema Neuro non-focal Skin warm and dry No muscular weakness   EKG:  04/17/18 SR rate 76 normal   Recent Labs: 06/20/2017: Hemoglobin 14.1; Platelets 230    Lipid Panel    Component Value Date/Time   CHOL 174  09/09/2015 1724   TRIG 87 09/09/2015 1724   HDL 57 09/09/2015 1724   CHOLHDL 3.1 09/09/2015 1724   VLDL 17 09/09/2015 1724   LDLCALC 100 (H) 09/09/2015 1724      Wt Readings from Last 3 Encounters:  04/17/18 281 lb 12.8 oz (127.8 kg)  12/23/17 296 lb 8 oz (134.5 kg)  11/11/17 (!) 300 lb 6.4 oz (136.3 kg)      Other studies Reviewed: Additional studies/ records that were reviewed today include: Hosptial admission records, ECG;s 12/15 . ST rate 123 QT 342  Bronch June  2018 Echo 06/13/16    ASSESSMENT AND PLAN:  1.  Syncope:  Etiology not clear. Normal ETT 04/23/15  and Normal EF by echo 06/13/16  Observe  2.  ETOH Abuse  Sober for over a year now   congratulated her  3.  Asthma/pneumonia:  F/u [facility] Dr Isaiah SergeMannam continue low dose steroids and inhalers 4.  Reflux:  Discussed low carb diet eating meals earlier and blocks under bed 5.  Edema likely from weight and steroids PRN lasix  6.  Tachycardia: better on higher dose Toporl related to asthma/COPD and obesity  Current medicines are reviewed at length with the patient today.  The patient does not have concerns regarding medicines.  The following changes have been made:  no change  Labs/ tests ordered today include:     No orders of the defined types were placed in this encounter.    Disposition:   FU with me PRN    Signed, Charlton HawsPeter Journiee Feldkamp, MD  04/17/2018 8:42 AM    [facility] [address], [place], [place]  [address] Phone: [phone]) [phone]; Fax: [phone]

## 2018-04-17 ENCOUNTER — Encounter: Payer: Self-pay | Admitting: Cardiovascular Disease

## 2018-04-17 ENCOUNTER — Ambulatory Visit: Payer: BC Managed Care – PPO | Admitting: Cardiovascular Disease

## 2018-04-17 VITALS — BP 110/78 | HR 76 | Ht 65.0 in | Wt 281.8 lb

## 2018-04-17 DIAGNOSIS — I10 Essential (primary) hypertension: Secondary | ICD-10-CM | POA: Diagnosis not present

## 2018-04-17 DIAGNOSIS — I5032 Chronic diastolic (congestive) heart failure: Secondary | ICD-10-CM

## 2018-04-17 NOTE — Patient Instructions (Signed)
Medication Instructions:   If you need a refill on your cardiac medications before your next appointment, please call your pharmacy.   Lab work:  If you have labs (blood work) drawn today and your tests are completely normal, you will receive your results only by: . MyChart Message (if you have MyChart) OR . A paper copy in the mail If you have any lab test that is abnormal or we need to change your treatment, we will call you to review the results.  Testing/Procedures: NONE ordered today.  Follow-Up: At [facility], you and your health needs are our priority.  As part of our continuing mission to provide you with exceptional heart care, we have created designated Provider Care Teams.  These Care Teams include your primary Cardiologist (physician) and Advanced Practice Providers (APPs -  Physician Assistants and Nurse Practitioners) who all work together to provide you with the care you need, when you need it. Your physician recommends that you schedule a follow-up appointment as needed with Dr. Nishan.   

## 2018-04-24 ENCOUNTER — Telehealth: Payer: Self-pay | Admitting: Pulmonary Disease

## 2018-04-24 NOTE — Telephone Encounter (Signed)
Called and spoke with pt letting her know that Arlys John stated she needed to come in for an OV with cxr prior so we can see what that showed [place]. Stated to her that he said that we would not refill doxy without the OV and cxr to be completed.  Pt expressed understanding and stated to me that she could not come in for an OV. I advised pt if she could not come in, she could either go to urgent care or to the ER. Pt expressed understanding. Nothing further needed.

## 2018-04-24 NOTE — Telephone Encounter (Signed)
Patient needs office visit.  With chest x-ray stat prior to office visit.  Will not refill doxycycline without office visit and chest x-ray completed.  [name], FNP

## 2018-04-24 NOTE — Telephone Encounter (Signed)
Called and spoke with pt who states she feels like she is getting pna again. Pt is having trouble breathing and is doing a treatment every hour to help with her breathing.  Pt did start taking pred yesterday due to having some left over from a prev Rx.  Pt states symptoms began Friday, 04/21/18.   Pt denies any fever. Pt is coughing up green mucus.  Advised pt that we needed to get her to come in for an appt but pt stated to me that she cannot come in for an appt either today or tomorrow.  Pt is requesting a refill of doxy. Arlys John, please advise recs for pt. Thanks!

## 2018-05-16 ENCOUNTER — Other Ambulatory Visit: Payer: Self-pay | Admitting: Surgery

## 2018-05-18 ENCOUNTER — Other Ambulatory Visit: Payer: Self-pay | Admitting: *Deleted

## 2018-05-18 MED ORDER — METOPROLOL SUCCINATE ER 50 MG PO TB24: ORAL_TABLET | ORAL | 3 refills | Status: DC

## 2018-05-19 ENCOUNTER — Other Ambulatory Visit: Payer: Self-pay | Admitting: Surgery

## 2018-05-19 DIAGNOSIS — K8050 Calculus of bile duct without cholangitis or cholecystitis without obstruction: Secondary | ICD-10-CM

## 2018-05-20 ENCOUNTER — Other Ambulatory Visit: Payer: Self-pay | Admitting: Allergy and Immunology

## 2018-05-22 ENCOUNTER — Other Ambulatory Visit: Payer: Self-pay | Admitting: *Deleted

## 2018-05-22 ENCOUNTER — Telehealth: Payer: Self-pay | Admitting: *Deleted

## 2018-05-22 ENCOUNTER — Other Ambulatory Visit: Payer: Self-pay | Admitting: Surgery

## 2018-05-22 DIAGNOSIS — K8050 Calculus of bile duct without cholangitis or cholecystitis without obstruction: Secondary | ICD-10-CM

## 2018-05-22 MED ORDER — MONTELUKAST SODIUM 10 MG PO TABS: 10.0000 mg | ORAL_TABLET | Freq: Every day | ORAL | 0 refills | Status: DC

## 2018-05-22 MED ORDER — OMEPRAZOLE 40 MG PO CPDR: DELAYED_RELEASE_CAPSULE | ORAL | 0 refills | Status: DC

## 2018-05-22 NOTE — Telephone Encounter (Signed)
Called patient and she confirmed that it is Omeprazole that needs to be refilled. 30 [facility]. Patient verbalized understanding.

## 2018-05-22 NOTE — Telephone Encounter (Signed)
Patient called back stating she forgot she needs metoprolol 50mg  refilled and sent to the same pharmacy as the other medication. Please advise

## 2018-05-22 NOTE — Telephone Encounter (Signed)
Patient called and scheduled an appointment for 06/20/2018 for kozlow, patient requested refills on singular until her next appointment. Please advise

## 2018-05-22 NOTE — Telephone Encounter (Signed)
Prescription refill has been sent in with notation that patient must keep the scheduled office visit or no further refills will be sent.

## 2018-05-23 ENCOUNTER — Other Ambulatory Visit: Payer: Self-pay | Admitting: Allergy and Immunology

## 2018-05-24 ENCOUNTER — Ambulatory Visit
Admission: RE | Admit: 2018-05-24 | Discharge: 2018-05-24 | Disposition: A | Discharge status: Home / Self Care | Payer: BC Managed Care – PPO | Source: Ambulatory Visit | Attending: Surgery | Admitting: Surgery

## 2018-05-24 DIAGNOSIS — K8050 Calculus of bile duct without cholangitis or cholecystitis without obstruction: Secondary | ICD-10-CM

## 2018-05-25 ENCOUNTER — Other Ambulatory Visit (HOSPITAL_COMMUNITY): Payer: Self-pay | Admitting: Surgery

## 2018-05-25 ENCOUNTER — Other Ambulatory Visit: Payer: Self-pay | Admitting: Surgery

## 2018-05-25 DIAGNOSIS — K8050 Calculus of bile duct without cholangitis or cholecystitis without obstruction: Secondary | ICD-10-CM

## 2018-06-08 ENCOUNTER — Encounter (HOSPITAL_COMMUNITY)
Admission: RE | Admit: 2018-06-08 | Discharge: 2018-06-08 | Disposition: A | Discharge status: Home / Self Care | Payer: BC Managed Care – PPO | Source: Ambulatory Visit | Attending: Surgery | Admitting: Surgery

## 2018-06-08 DIAGNOSIS — K8050 Calculus of bile duct without cholangitis or cholecystitis without obstruction: Secondary | ICD-10-CM | POA: Diagnosis present

## 2018-06-08 MED ORDER — TECHNETIUM TC 99M MEBROFENIN IV KIT
5.4000 | PACK | Freq: Once | INTRAVENOUS | Status: AC | PRN
  Administered 2018-06-08: 5.4 via INTRAVENOUS

## 2018-06-09 ENCOUNTER — Other Ambulatory Visit: Payer: Self-pay | Admitting: Surgery

## 2018-06-13 ENCOUNTER — Other Ambulatory Visit: Payer: Self-pay | Admitting: Pulmonary Disease

## 2018-06-19 NOTE — Patient Instructions (Addendum)
Kara [name]  06/19/2018   Your procedure is scheduled on:  06-21-2018   Report to [facility] Main  Entrance,  Report to admitting at  9:15 AM    Call this number if you have problems the morning of surgery [phone]       Remember: NO SOLID FOOD AFTER MIDNIGHT THE NIGHT PRIOR TO SURGERY. NOTHING BY MOUTH EXCEPT CLEAR LIQUIDS UNTIL 3 HOURS PRIOR TO SCHEULED SURGERY 8:15 AM. PLEASE FINISH ENSURE DRINK PER SURGEON ORDER 3 HOURS PRIOR TO SCHEDULED SURGERY TIME WHICH NEEDS TO BE COMPLETED AT  8:15 AM.  NOTHING BY MOUTH AFTER 8:15 AM INCLUDING WATER, CANDY, GUM, MINTS.   BRUSH YOUR TEETH MORNING OF SURGERY AND RINSE YOUR MOUTH OUT        Take these medicines the morning of surgery with A SIP OF WATER:   Bupropion,  Gabapentin,  Metoprolol,  Naltrexon (depode),    Rosuvastatin (crestor),  Sertraline (zoloft),    Breo Ellipta Inhaler,  Incruse Ellipta Inhaler,  Tramadol if needed,  ProAir inhaler if needed,  Nebulizer if needed,  BRING YOUR INHALER'S WITH YOU DAY OF SURGERY                                  You may not have any metal on your body including hair pins and              piercings  Do not wear jewelry, make-up, lotions, powders or perfumes, deodorant             Do not wear nail polish.  Do not shave  48 hours prior to surgery.        Do not bring valuables to the hospital. [facility] IS NOT             RESPONSIBLE   FOR VALUABLES.  Contacts, dentures or bridgework may not be worn into surgery.  Leave suitcase in the car. After surgery it may be brought to your room.       Patients discharged the day of surgery will not be allowed to drive home. IF YOU ARE HAVING SURGERY AND GOING HOME THE SAME DAY, YOU MUST HAVE AN ADULT TO DRIVE YOU HOME AND BE WITH YOU FOR 24 HOURS. YOU MAY GO HOME BY TAXI OR UBER OR ORTHERWISE, BUT AN ADULT MUST ACCOMPANY YOU HOME AND STAY WITH YOU FOR 24 HOURS.    Name and phone number of your driver:  HUSBAND--   FRANKI SKOCZYLAS #[phone]                _____________________________________________________________________     CLEAR LIQUID DIET   Foods Allowed                                                                     Foods Excluded  Coffee and tea, regular and decaf                             liquids that you cannot  Plain Jell-O in any flavor  see through such as: Fruit ices (not with fruit pulp)                                     milk, soups, orange juice  Iced Popsicles                                    All solid food Carbonated beverages, regular and diet                                    Cranberry, grape and apple juices Sports drinks like Gatorade Lightly seasoned clear broth or consume(fat free) Sugar, honey syrup    _____________________________________________________________________             [facility] - Preparing for Surgery Before surgery, you can play an important role.  Because skin is not sterile, your skin needs to be as free of germs as possible.  You can reduce the number of germs on your skin by washing with CHG (chlorahexidine gluconate) soap before surgery.  CHG is an antiseptic cleaner which kills germs and bonds with the skin to continue killing germs even after washing. Please DO NOT use if you have an allergy to CHG or antibacterial soaps.  If your skin becomes reddened/irritated stop using the CHG and inform your nurse when you arrive at Short Stay. Do not shave (including legs and underarms) for at least 48 hours prior to the first CHG shower.  You may shave your face/neck. Please follow these instructions carefully:  1.  Shower with CHG Soap the night before surgery and the  morning of Surgery.  2.  If you choose to wash your hair, wash your hair first as usual with your  normal  shampoo.  3.  After you shampoo, rinse your hair and body thoroughly to remove the  shampoo.                             4.  Use CHG as you would any other liquid soap.  You can apply chg directly  to the skin and wash                       Gently with a scrungie or clean washcloth.  5.  Apply the CHG Soap to your body ONLY FROM THE NECK DOWN.   Do not use on face/ open                           Wound or open sores. Avoid contact with eyes, ears mouth and genitals (private parts).                       Wash face,  Genitals (private parts) with your normal soap.             6.  Wash thoroughly, paying special attention to the area where your surgery  will be performed.  7.  Thoroughly rinse your body with warm water from the neck down.  8.  DO NOT shower/wash with your normal soap after using and rinsing off  the CHG Soap.  9.  Pat yourself dry with a clean towel.            10.  Wear clean pajamas.            11.  Place clean sheets on your bed the night of your first shower and do not  sleep with pets. Day of Surgery : Do not apply any lotions/deodorants the morning of surgery.  Please wear clean clothes to the hospital/surgery center.  FAILURE TO FOLLOW THESE INSTRUCTIONS MAY RESULT IN THE CANCELLATION OF YOUR SURGERY PATIENT SIGNATURE_________________________________  NURSE SIGNATURE__________________________________  ________________________________________________________________________

## 2018-06-20 ENCOUNTER — Encounter (HOSPITAL_COMMUNITY)
Admission: RE | Admit: 2018-06-20 | Discharge: 2018-06-20 | Disposition: A | Discharge status: Home / Self Care | Payer: BC Managed Care – PPO | Source: Ambulatory Visit | Attending: Surgery | Admitting: Surgery

## 2018-06-20 ENCOUNTER — Encounter (HOSPITAL_COMMUNITY): Payer: Self-pay

## 2018-06-20 ENCOUNTER — Other Ambulatory Visit: Payer: Self-pay

## 2018-06-20 ENCOUNTER — Telehealth: Payer: Self-pay

## 2018-06-20 ENCOUNTER — Ambulatory Visit: Payer: BC Managed Care – PPO | Admitting: Allergy and Immunology

## 2018-06-20 DIAGNOSIS — Z01812 Encounter for preprocedural laboratory examination: Secondary | ICD-10-CM | POA: Insufficient documentation

## 2018-06-20 HISTORY — DX: Severe persistent asthma, uncomplicated: J45.50

## 2018-06-20 HISTORY — DX: Personal history of other venous thrombosis and embolism: Z86.718

## 2018-06-20 HISTORY — DX: Polyneuropathy, unspecified: G62.9

## 2018-06-20 HISTORY — DX: Anemia, unspecified: D64.9

## 2018-06-20 HISTORY — DX: Unspecified diastolic (congestive) heart failure: I50.30

## 2018-06-20 HISTORY — DX: Personal history of other infectious and parasitic diseases: Z86.19

## 2018-06-20 HISTORY — DX: Personal history of other diseases of the digestive system: Z87.19

## 2018-06-20 HISTORY — DX: Allergic rhinitis, unspecified: J30.9

## 2018-06-20 HISTORY — DX: Alcohol dependence, in remission: F10.21

## 2018-06-20 HISTORY — DX: Other specified diseases of gallbladder: K82.8

## 2018-06-20 HISTORY — DX: Personal history of other specified conditions: Z87.898

## 2018-06-20 LAB — CBC
HCT: 42.6 % (ref 36.0–46.0)
Hemoglobin: 13.1 g/dL (ref 12.0–15.0)
MCH: 28.7 pg (ref 26.0–34.0)
MCHC: 30.8 g/dL (ref 30.0–36.0)
MCV: 93.2 fL (ref 80.0–100.0)
Platelets: 214 10*3/uL (ref 150–400)
RBC: 4.57 MIL/uL (ref 3.87–5.11)
RDW: 14.3 % (ref 11.5–15.5)
WBC: 7.8 10*3/uL (ref 4.0–10.5)
nRBC: 0 % (ref 0.0–0.2)

## 2018-06-20 LAB — BASIC METABOLIC PANEL
Anion gap: 8 (ref 5–15)
BUN: 6 mg/dL (ref 6–20)
CO2: 27 mmol/L (ref 22–32)
Calcium: 9.2 mg/dL (ref 8.9–10.3)
Chloride: 103 mmol/L (ref 98–111)
Creatinine, Ser: 0.75 mg/dL (ref 0.44–1.00)
GFR calc Af Amer: >60 mL/min (ref >60)
GFR calc non Af Amer: >60 mL/min (ref >60)
Glucose, Bld: 106 mg/dL — ABNORMAL HIGH (ref 70–99)
Potassium: 4.5 mmol/L (ref 3.5–5.1)
Sodium: 138 mmol/L (ref 135–145)

## 2018-06-20 MED ORDER — CHLORHEXIDINE GLUCONATE CLOTH 2 % EX PADS
6.0000 | MEDICATED_PAD | Freq: Once | CUTANEOUS | Status: DC
  Filled 2018-06-20: qty 6

## 2018-06-20 MED ORDER — OMEPRAZOLE 40 MG PO CPDR: DELAYED_RELEASE_CAPSULE | ORAL | 0 refills | Status: DC

## 2018-06-20 MED ORDER — DEXTROSE 5 % IV SOLN
3.0000 g | INTRAVENOUS | Status: AC
  Administered 2018-06-21: 3 g via INTRAVENOUS
  Filled 2018-06-20: qty 3

## 2018-06-20 MED ORDER — MONTELUKAST SODIUM 10 MG PO TABS: 10.0000 mg | ORAL_TABLET | Freq: Every day | ORAL | 0 refills | Status: DC

## 2018-06-20 NOTE — H&P (Addendum)
Kara [name]  Location: [facility] Patient #: [id number] DOB: 1985/05/29 Married / Language: English / Race: White Female   History of Present Illness  The patient is a 33 year old female who presents with abdominal pain. This is a pleasant 33 year old female who is a self-referral. She presents with a history of epigastric and right upper quadrant pain with nausea and vomiting. She has a previous history of alcohol abuse. She has been sober from his 2 years. She is at previous problems with pancreatitis. She recently had labs done in November of last year which showed slightly elevated amylase and lipase. Her liver function tests were normal. She has never had an ultrasound. She has had multiple CT scans evaluating her pancreatitis which did not show stones in the gallbladder the gallbladder was contracted. She is also recently come off of chronic steroids for pulmonary issues.   Past Surgical History  No pertinent past surgical history   Diagnostic Studies History  Colonoscopy  never Mammogram  never Pap Smear  1-5 years ago  Allergies NSAIDs   Medication History  Albuterol Sulfate HFA (108 (90 Base)MCG/ACT Aerosol Soln, Inhalation) Active. buPROPion HCl ER (XL) (450MG  Tablet ER 24HR, Oral) Active. Cyclobenzaprine HCl (5MG  Tablet, Oral) Active. Dupixent (300MG /2ML Soln Pref Syr, Subcutaneous) Active. Furosemide (20MG  Tablet, Oral) Active. Gabapentin (300MG  Capsule, Oral) Active. Metoprolol Succinate ER (50MG  Tablet ER 24HR, Oral) Active. Montelukast Sodium (10MG  Tablet, Oral) Active. Naltrexone HCl (50MG  Tablet, Oral) Active. Omeprazole (40MG  Capsule DR, Oral) Active. QUEtiapine Fumarate (100MG  Tablet, Oral) Active. Rosuvastatin Calcium (10MG  Tablet, Oral) Active. Sertraline HCl (50MG  Tablet, Oral) Active. Breo Ellipta (200-25MCG/INH Aero Pow Br Act, Inhalation) Active. Thiamine HCl (100MG  Tablet, Oral) Active. Multivitamin (Oral)  Active. Melatonin (10MG  Tablet, Oral) Active. EPINEPHrine (0.3MG /0.3ML Soln Auto-inj, Injection) Active. Oscal 500/200 D-3 (500-200MG -UNIT Tablet, Oral) Active. Medications Reconciled  Social History Alcohol use  Remotely quit alcohol use. Caffeine use  Carbonated beverages. Illicit drug use  Remotely quit drug use. Tobacco use  Current every day smoker.  Family History  Alcohol Abuse  Father. Arthritis  Father, Mother. Colon Polyps  Mother. Depression  Sister. Diabetes Mellitus  Father. Hypertension  Father. Migraine Headache  Mother.  Pregnancy / Birth History Doristine Devoid, CMA;  Contraceptive History  Oral contraceptives. Gravida  0 Irregular periods  Para  0  Other Problems Alcohol Abuse  Anxiety Disorder  Asthma  Back Pain  Depression  Gastroesophageal Reflux Disease  Pancreatitis     Review of Systems (Chemira Jones CMA;  General Present- Appetite Loss. Not Present- Chills, Fatigue, Fever, Night Sweats, Weight Gain and Weight Loss. Skin Not Present- Change in Wart/Mole, Dryness, Hives, Jaundice, New Lesions, Non-Healing Wounds, Rash and Ulcer. HEENT Present- Ringing in the Ears and Seasonal Allergies. Not Present- Earache, Hearing Loss, Hoarseness, Nose Bleed, Oral Ulcers, Sinus Pain, Sore Throat, Visual Disturbances, Wears glasses/contact lenses and Yellow Eyes. Respiratory Present- Wheezing. Not Present- Bloody sputum, Chronic Cough, Difficulty Breathing and Snoring. Breast Not Present- Breast Mass, Breast Pain, Nipple Discharge and Skin Changes. Cardiovascular Not Present- Chest Pain, Difficulty Breathing Lying Down, Leg Cramps, Palpitations, Rapid Heart Rate, Shortness of Breath and Swelling of Extremities. Gastrointestinal Present- Abdominal Pain, Bloating, Excessive gas, Gets full quickly at meals and Nausea. Not Present- Bloody Stool, Change in Bowel Habits, Chronic diarrhea, Constipation, Difficulty Swallowing, Hemorrhoids,  Indigestion, Rectal Pain and Vomiting. Female Genitourinary Not Present- Frequency, Nocturia, Painful Urination, Pelvic Pain and Urgency. Musculoskeletal Present- Back Pain. Not Present- Joint Pain, Joint Stiffness, Muscle Pain,  Muscle Weakness and Swelling of Extremities. Neurological Present- Numbness and Tingling. Not Present- Decreased Memory, Fainting, Headaches, Seizures, Tremor, Trouble walking and Weakness. Psychiatric Present- Anxiety. Not Present- Bipolar, Change in Sleep Pattern, Depression, Fearful and Frequent crying. Endocrine Not Present- Cold Intolerance, Excessive Hunger, Hair Changes, Heat Intolerance, Hot flashes and New Diabetes. Hematology Present- Easy Bruising. Not Present- Blood Thinners, Excessive bleeding, Gland problems, HIV and Persistent Infections.  Vitals  Weight: 273.4 lb Height: 65in Body Surface Area: 2.26 m Body Mass Index: 45.5 kg/m  Temp.: 98.90F(Oral)  Pulse: 101 (Regular)  BP: 126/80 (Sitting, Left Arm, Standard)    Physical Exam  General Mental Status-Alert. General Appearance-Consistent with stated age. Hydration-Well hydrated. Voice-Normal.  Head and Neck Head-normocephalic, atraumatic with no lesions or palpable masses.  Eye Eyeball - Bilateral-Extraocular movements intact. Sclera/Conjunctiva - Bilateral-No scleral icterus.  Chest and Lung Exam Chest and lung exam reveals -quiet, even and easy respiratory effort with no use of accessory muscles and on auscultation, normal breath sounds, no adventitious sounds and normal vocal resonance. Inspection Chest Wall - Normal. Back - normal.  Cardiovascular Cardiovascular examination reveals -on palpation PMI is normal in location and amplitude, no palpable S3 or S4. Normal cardiac borders., normal heart sounds, regular rate and rhythm with no murmurs, carotid auscultation reveals no bruits and normal pedal pulses bilaterally.  Abdomen Inspection Inspection of  the abdomen reveals - No Hernias. Skin - Scar - no surgical scars. Palpation/Percussion Palpation and Percussion of the abdomen reveal - Soft, Non Tender, No Rebound tenderness, No Rigidity (guarding) and No hepatosplenomegaly. Auscultation Auscultation of the abdomen reveals - Bowel sounds normal. Note: There is minimal abdominal tenderness   Neurologic - Did not examine.  Musculoskeletal - Did not examine.    Assessment & Plan   BILIARY COLIC (K80.50)  Impression: It is difficult to tell whether this is biliary colic or smoldering pancreatitis although her symptoms now are new and more consistent with biliary colic. I believe she is an ultrasound of her gallbladder to determine whether or not there are gallstones as well as evaluate the biliary system and to see whether there could be any pancreatic pseudocyst. She had a copy of her laboratory data which I reviewed. We discussed biliary disease in detail. She has been doing well and again has been off of alcohol for 2 years. She is also losing weight. I will see her back after her ultrasound is complete and we can discuss whether or not to proceed with cholecystectomy  Addendum:  Ultrasound negative for gallstones or obvious pseudocyst.  HIDA scan shows a 7% gallbladder ejection fraction consist with biliary dyskinesia and I suspect chronic cholecystitis. Laparoscopic cholecystectomy is recommended. I discussed the procedure in detail. We discussed the risks and benefits of a laparoscopic cholecystectomy and possible cholangiogram including, but not limited to bleeding, infection, injury to surrounding structures such as the intestine or liver, bile leak, retained gallstones, need to convert to an open procedure, prolonged diarrhea, blood clots such as  DVT, common bile duct injury, anesthesia risks, and possible need for additional procedures.  The likelihood of improvement in symptoms and return to the patient's normal status is good. We  discussed the typical post-operative recovery course.   06/21/3018 No change in H and P

## 2018-06-20 NOTE — Telephone Encounter (Signed)
Spoke with patient, she is aware refills have been sent in.

## 2018-06-20 NOTE — Anesthesia Preprocedure Evaluation (Addendum)
Anesthesia Evaluation  Patient identified by MRN, date of birth, ID band Patient awake    Reviewed: Allergy & Precautions, NPO status , Patient's Chart, lab work & pertinent test results  Airway Mallampati: II  TM Distance: >3 FB Neck ROM: Full    Dental no notable dental hx. (+) Dental Advisory Given   Pulmonary asthma , Current Smoker,    Pulmonary exam normal        Cardiovascular hypertension, Normal cardiovascular exam  Echo 06/13/2016 Study Conclusions  - Left ventricle: The cavity size was normal. Systolic function was normal. The estimated ejection fraction was in the range of 60% to 65%. Wall motion was normal; there were no regional wall motion abnormalities. Left ventricular diastolic function parameters were normal. - Tricuspid valve: There was trivial regurgitation. - Pulmonary arteries: Systolic pressure could not be accurately estimated.  Stress Test 04/23/2015  There was no ST segment deviation noted during stress.  No T wave inversion was noted during stress.  Normal ECG treadmill stress test.   Neuro/Psych  Headaches, PSYCHIATRIC DISORDERS    GI/Hepatic Neg liver ROS, GERD  ,  Endo/Other  Morbid obesity  Renal/GU negative Renal ROS  negative genitourinary   Musculoskeletal negative musculoskeletal ROS (+)   Abdominal   Peds negative pediatric ROS (+)  Hematology negative hematology ROS (+)   Anesthesia Other Findings   Reproductive/Obstetrics negative OB ROS                            Anesthesia Physical Anesthesia Plan  ASA: III  Anesthesia Plan: General   Post-op Pain Management:    Induction: Intravenous  PONV Risk Score and Plan: 3 and Ondansetron, Dexamethasone and Scopolamine patch - Pre-op  Airway Management Planned: Oral ETT  Additional Equipment:   Intra-op Plan:   Post-operative Plan: Extubation in OR  Informed Consent: I  have reviewed the patients History and Physical, chart, labs and discussed the procedure including the risks, benefits and alternatives for the proposed anesthesia with the patient or authorized representative who has indicated his/her understanding and acceptance.     Dental advisory given  Plan Discussed with: CRNA and Anesthesiologist  Anesthesia Plan Comments: (See PAT note 06/20/18, Jodell Cipro, PA-C)       Anesthesia Quick Evaluation

## 2018-06-20 NOTE — Progress Notes (Signed)
Anesthesia Chart Review   Case:  [id number] Date/Time:  06/21/18 1100   Procedure:  LAPAROSCOPIC CHOLECYSTECTOMY (N/A )   Anesthesia type:  General   Pre-op diagnosis:  DYSKINESIA   Location:  [facility] ROOM 02 / [facility]   Surgeon:  Abigail Miyamoto, MD      DISCUSSION: 33 yo current every day smoker (2.5 pack years) with h/o GERD, polyneuropathy, diastolic CHF, HTN, asthma, anemia, h/o alcohol abuse (s/p inpatient tx, sober since 2017), OSA w/CPAP, dyskinesia scheduled for above procedure 06/21/18 with Dr. Abigail Miyamoto.   Last seen by pulmonology on 11/11/17, seen by Ames Dura, NP.  Stable at this visit.    Last seen by cardiologist, Dr. Charlton Haws, on 04/17/18.  Stable at this visit, prn follow up recommended.   Pt can proceed with planned procedure barring acute status change.  VS: BP 114/60 (BP Location: Right Arm)   Pulse 88   Temp 36.9 C (Oral)   Resp 18   Ht 5\' 5"  (1.651 m)   Wt 123.8 kg   LMP 05/08/2018   SpO2 98%   BMI 45.43 kg/m   PROVIDERS: [name], MD is PCP   Charlton Haws, MD is Cardiologist   Chilton Greathouse, MD is Pulmonologist  LABS: Labs reviewed: Acceptable for surgery. (all labs ordered are listed, but only abnormal results are displayed)  Labs Reviewed  BASIC METABOLIC PANEL - Abnormal; Notable for the following components:      Result Value   Glucose, Bld 106 (*)    All other components within normal limits  CBC     IMAGES:   EKG: 04/17/18 Rate 76 bpm Normal sinus rhythm  Normal ECG  CV: Echo 06/13/2016 Study Conclusions  - Left ventricle: The cavity size was normal. Systolic function was   normal. The estimated ejection fraction was in the range of 60%   to 65%. Wall motion was normal; there were no regional wall   motion abnormalities. Left ventricular diastolic function   parameters were normal. - Tricuspid valve: There was trivial regurgitation. - Pulmonary arteries: Systolic pressure could not be accurately  estimated.  Stress Test 04/23/2015  There was no ST segment deviation noted during stress.  No T wave inversion was noted during stress.   Normal ECG treadmill stress test. Past Medical History:  Diagnosis Date  . Alcoholism in remission (HCC)    per pt in remission since 2017  . Anemia   . AR (allergic rhinitis)    allergist-- dr Lucie Leather  . Biliary dyskinesia   . Diastolic CHF (HCC)    followd by dr Demetrius Charity. [facility] Emms  . Environmental allergies   . GERD (gastroesophageal reflux disease)   . Headache   . History of acute pancreatitis    alcoholic pancreatitis ,  2016 and 2017  . History of sepsis 06/12/2016   with CAP  . History of syncope   . 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 hematoma due to coumadin treated with IR embolization of the IMA branches  . Hypertension   . Pancreatitis   . Polyneuropathy   . Prolonged QT syndrome    cardiologist-- dr Eden Emms (notes in epic)  . Severe persistent asthma    pulmonologist-- dr dr Belva Crome (notes in epic)    Past Surgical History:  Procedure Laterality Date  . ESOPHAGOGASTRODUODENOSCOPY N/A 07/17/2014   Procedure: ESOPHAGOGASTRODUODENOSCOPY (EGD);  Surgeon: Dorena Cookey, MD;  Location: [facility];  Service: Endoscopy;  Laterality: N/A;  . TYMPANOSTOMY TUBE PLACEMENT Bilateral 11 MONTHS OLD  . VIDEO BRONCHOSCOPY Bilateral 10/08/2016   Procedure: VIDEO BRONCHOSCOPY WITH FLUORO;  Surgeon: Chilton Greathouse, MD;  Location: [facility];  Service: Cardiopulmonary;  Laterality: Bilateral;    MEDICATIONS: . albuterol (PROVENTIL) (2.5 MG/3ML) 0.083% nebulizer solution  . buPROPion HCl ER, XL, 450 MG TB24  . calcium-vitamin D (OSCAL WITH D) 500-200 MG-UNIT tablet  . chlorhexidine (PERIDEX) 0.12 % solution  . Cholecalciferol (VITAMIN D PO)  . cyclobenzaprine (FLEXERIL) 5 MG tablet  . DUPIXENT 300 MG/2ML SOSY  . EPINEPHrine (EPIPEN 2-PAK) 0.3 mg/0.3 mL IJ SOAJ injection  .  fluticasone furoate-vilanterol (BREO ELLIPTA) 200-25 MCG/INH AEPB  . furosemide (LASIX) 20 MG tablet  . gabapentin (NEURONTIN) 300 MG capsule  . ipratropium-albuterol (DUONEB) 0.5-2.5 (3) MG/3ML SOLN  . Melatonin 10 MG TABS  . metoprolol succinate (TOPROL-XL) 50 MG 24 hr tablet  . montelukast (SINGULAIR) 10 MG tablet  . Multiple Vitamins-Minerals (MULTIVITAMIN WITH MINERALS) tablet  . naltrexone (DEPADE) 50 MG tablet  . omeprazole (PRILOSEC) 40 MG capsule  . PROAIR HFA 108 (90 Base) MCG/ACT inhaler  . QUEtiapine (SEROQUEL) 50 MG tablet  . rosuvastatin (CRESTOR) 10 MG tablet  . sertraline (ZOLOFT) 50 MG tablet  . thiamine 100 MG tablet  . traMADol (ULTRAM) 50 MG tablet  . umeclidinium bromide (INCRUSE ELLIPTA) 62.5 MCG/INH AEPB   . Chlorhexidine Gluconate Cloth 2 % PADS 6 each   And  . Chlorhexidine Gluconate Cloth 2 % PADS 6 each   . [START ON 06/21/2018] ceFAZolin (ANCEF) 3 g in dextrose 5 % 50 mL IVPB    Janey Genta [facility] Pre-Surgical Testing [phone] 06/20/18 11:33 AM

## 2018-06-20 NOTE — Progress Notes (Addendum)
Chart to anesthesia, Jodell Cipro PA , for review.    ADDENDUM:  EKG dated 04-17-2018 in epic. ETT dated 04-23-2015 in epic. ECHO dated 06-13-2016 in epic.  ADDENDUM:  Chart to Short Stay.

## 2018-06-20 NOTE — Telephone Encounter (Signed)
Patient has to r/s appt for today due to surgery . I have r/s the patient to 07/11/2018 with Dr Lucie Leather. Patient was wondering if she could get a courtesy refill on Singulair and omeprazole.  Please Advise   [facility] [address].

## 2018-06-21 ENCOUNTER — Ambulatory Visit (HOSPITAL_COMMUNITY)
Admission: RE | Admit: 2018-06-21 | Discharge: 2018-06-21 | Disposition: A | Discharge status: Home / Self Care | Payer: BC Managed Care – PPO | Attending: Surgery | Admitting: Surgery

## 2018-06-21 ENCOUNTER — Encounter (HOSPITAL_COMMUNITY)
Admission: RE | Disposition: A | Discharge status: Home / Self Care | Payer: Self-pay | Source: Home / Self Care | Attending: Surgery

## 2018-06-21 ENCOUNTER — Ambulatory Visit (HOSPITAL_COMMUNITY): Payer: BC Managed Care – PPO | Admitting: Physician Assistant

## 2018-06-21 ENCOUNTER — Encounter (HOSPITAL_COMMUNITY): Payer: Self-pay | Admitting: *Deleted

## 2018-06-21 ENCOUNTER — Ambulatory Visit (HOSPITAL_COMMUNITY): Payer: BC Managed Care – PPO | Admitting: Anesthesiology

## 2018-06-21 DIAGNOSIS — K219 Gastro-esophageal reflux disease without esophagitis: Secondary | ICD-10-CM | POA: Diagnosis not present

## 2018-06-21 DIAGNOSIS — Z79899 Other long term (current) drug therapy: Secondary | ICD-10-CM | POA: Insufficient documentation

## 2018-06-21 DIAGNOSIS — Z8249 Family history of ischemic heart disease and other diseases of the circulatory system: Secondary | ICD-10-CM | POA: Diagnosis not present

## 2018-06-21 DIAGNOSIS — Z7951 Long term (current) use of inhaled steroids: Secondary | ICD-10-CM | POA: Diagnosis not present

## 2018-06-21 DIAGNOSIS — J45909 Unspecified asthma, uncomplicated: Secondary | ICD-10-CM | POA: Insufficient documentation

## 2018-06-21 DIAGNOSIS — F17200 Nicotine dependence, unspecified, uncomplicated: Secondary | ICD-10-CM | POA: Insufficient documentation

## 2018-06-21 DIAGNOSIS — Z6841 Body Mass Index (BMI) 40.0 and over, adult: Secondary | ICD-10-CM | POA: Insufficient documentation

## 2018-06-21 DIAGNOSIS — Z886 Allergy status to analgesic agent status: Secondary | ICD-10-CM | POA: Diagnosis not present

## 2018-06-21 DIAGNOSIS — F419 Anxiety disorder, unspecified: Secondary | ICD-10-CM | POA: Insufficient documentation

## 2018-06-21 DIAGNOSIS — K8010 Calculus of gallbladder with chronic cholecystitis without obstruction: Secondary | ICD-10-CM | POA: Diagnosis not present

## 2018-06-21 DIAGNOSIS — K828 Other specified diseases of gallbladder: Secondary | ICD-10-CM | POA: Diagnosis present

## 2018-06-21 DIAGNOSIS — F329 Major depressive disorder, single episode, unspecified: Secondary | ICD-10-CM | POA: Diagnosis not present

## 2018-06-21 DIAGNOSIS — K859 Acute pancreatitis without necrosis or infection, unspecified: Secondary | ICD-10-CM | POA: Diagnosis not present

## 2018-06-21 HISTORY — PX: CHOLECYSTECTOMY: SHX55

## 2018-06-21 LAB — PREGNANCY, URINE: PREG TEST UR: NEGATIVE

## 2018-06-21 SURGERY — Surgical Case
Anesthesia: *Unknown

## 2018-06-21 SURGERY — LAPAROSCOPIC CHOLECYSTECTOMY
Anesthesia: General | Site: Abdomen

## 2018-06-21 MED ORDER — SUCCINYLCHOLINE CHLORIDE 200 MG/10ML IV SOSY
PREFILLED_SYRINGE | INTRAVENOUS | Status: AC
  Filled 2018-06-21: qty 20

## 2018-06-21 MED ORDER — PROPOFOL 10 MG/ML IV BOLUS
INTRAVENOUS | Status: AC
  Filled 2018-06-21: qty 20

## 2018-06-21 MED ORDER — FENTANYL CITRATE (PF) 250 MCG/5ML IJ SOLN
INTRAMUSCULAR | Status: AC
  Filled 2018-06-21: qty 5

## 2018-06-21 MED ORDER — HYDROMORPHONE HCL 1 MG/ML IJ SOLN
INTRAMUSCULAR | Status: AC
  Filled 2018-06-21: qty 1

## 2018-06-21 MED ORDER — LACTATED RINGERS IR SOLN
Status: DC | PRN
  Administered 2018-06-21: 1000 mL

## 2018-06-21 MED ORDER — PROMETHAZINE HCL 25 MG/ML IJ SOLN
6.2500 mg | INTRAMUSCULAR | Status: DC | PRN
  Administered 2018-06-21: 6.25 mg via INTRAVENOUS

## 2018-06-21 MED ORDER — DEXAMETHASONE SODIUM PHOSPHATE 10 MG/ML IJ SOLN
INTRAMUSCULAR | Status: DC | PRN
  Administered 2018-06-21: 10 mg via INTRAVENOUS

## 2018-06-21 MED ORDER — FENTANYL CITRATE (PF) 100 MCG/2ML IJ SOLN
INTRAMUSCULAR | Status: AC
  Filled 2018-06-21: qty 2

## 2018-06-21 MED ORDER — DIPHENHYDRAMINE HCL 50 MG/ML IJ SOLN
INTRAMUSCULAR | Status: AC
  Filled 2018-06-21: qty 1

## 2018-06-21 MED ORDER — PHENYLEPHRINE 40 MCG/ML (10ML) SYRINGE FOR IV PUSH (FOR BLOOD PRESSURE SUPPORT)
PREFILLED_SYRINGE | INTRAVENOUS | Status: AC
  Filled 2018-06-21: qty 30

## 2018-06-21 MED ORDER — SUGAMMADEX SODIUM 200 MG/2ML IV SOLN
INTRAVENOUS | Status: AC
  Filled 2018-06-21: qty 4

## 2018-06-21 MED ORDER — SUGAMMADEX SODIUM 500 MG/5ML IV SOLN
INTRAVENOUS | Status: AC
  Filled 2018-06-21: qty 5

## 2018-06-21 MED ORDER — ONDANSETRON HCL 4 MG/2ML IJ SOLN
INTRAMUSCULAR | Status: AC
  Filled 2018-06-21: qty 2

## 2018-06-21 MED ORDER — MIDAZOLAM HCL 2 MG/2ML IJ SOLN
INTRAMUSCULAR | Status: AC
  Filled 2018-06-21: qty 2

## 2018-06-21 MED ORDER — HYDROMORPHONE HCL 1 MG/ML IJ SOLN
0.2500 mg | INTRAMUSCULAR | Status: DC | PRN
  Administered 2018-06-21 (×4): 0.5 mg via INTRAVENOUS

## 2018-06-21 MED ORDER — LIDOCAINE 2% (20 MG/ML) 5 ML SYRINGE
INTRAMUSCULAR | Status: AC
  Filled 2018-06-21: qty 5

## 2018-06-21 MED ORDER — FENTANYL CITRATE (PF) 100 MCG/2ML IJ SOLN
25.0000 ug | INTRAMUSCULAR | Status: DC | PRN
  Administered 2018-06-21 (×3): 50 ug via INTRAVENOUS

## 2018-06-21 MED ORDER — GABAPENTIN 300 MG PO CAPS: 300.0000 mg | ORAL_CAPSULE | ORAL | Status: DC

## 2018-06-21 MED ORDER — DEXAMETHASONE SODIUM PHOSPHATE 10 MG/ML IJ SOLN
INTRAMUSCULAR | Status: AC
  Filled 2018-06-21: qty 2

## 2018-06-21 MED ORDER — ROCURONIUM BROMIDE 10 MG/ML (PF) SYRINGE
PREFILLED_SYRINGE | INTRAVENOUS | Status: DC | PRN
  Administered 2018-06-21: 30 mg via INTRAVENOUS

## 2018-06-21 MED ORDER — SUCCINYLCHOLINE CHLORIDE 200 MG/10ML IV SOSY
PREFILLED_SYRINGE | INTRAVENOUS | Status: DC | PRN
  Administered 2018-06-21: 160 mg via INTRAVENOUS

## 2018-06-21 MED ORDER — PROMETHAZINE HCL 25 MG/ML IJ SOLN
INTRAMUSCULAR | Status: AC
  Filled 2018-06-21: qty 1

## 2018-06-21 MED ORDER — BUPIVACAINE HCL (PF) 0.5 % IJ SOLN
INTRAMUSCULAR | Status: AC
  Filled 2018-06-21: qty 30

## 2018-06-21 MED ORDER — FENTANYL CITRATE (PF) 250 MCG/5ML IJ SOLN
INTRAMUSCULAR | Status: DC | PRN
  Administered 2018-06-21 (×2): 100 ug via INTRAVENOUS
  Administered 2018-06-21: 50 ug via INTRAVENOUS
  Administered 2018-06-21: 100 ug via INTRAVENOUS

## 2018-06-21 MED ORDER — LACTATED RINGERS IV SOLN
INTRAVENOUS | Status: DC | PRN
  Administered 2018-06-21: 11:00:00 via INTRAVENOUS

## 2018-06-21 MED ORDER — LIDOCAINE 2% (20 MG/ML) 5 ML SYRINGE
INTRAMUSCULAR | Status: AC
  Filled 2018-06-21: qty 20

## 2018-06-21 MED ORDER — KETOROLAC TROMETHAMINE 30 MG/ML IJ SOLN
INTRAMUSCULAR | Status: AC
  Filled 2018-06-21: qty 1

## 2018-06-21 MED ORDER — PROPOFOL 10 MG/ML IV BOLUS
INTRAVENOUS | Status: DC | PRN
  Administered 2018-06-21: 200 mg via INTRAVENOUS

## 2018-06-21 MED ORDER — LIDOCAINE 2% (20 MG/ML) 5 ML SYRINGE
INTRAMUSCULAR | Status: DC | PRN
  Administered 2018-06-21: 100 mg via INTRAVENOUS

## 2018-06-21 MED ORDER — CELECOXIB 200 MG PO CAPS
200.0000 mg | ORAL_CAPSULE | ORAL | Status: AC
  Administered 2018-06-21: 200 mg via ORAL
  Filled 2018-06-21: qty 1

## 2018-06-21 MED ORDER — ROCURONIUM BROMIDE 100 MG/10ML IV SOLN
INTRAVENOUS | Status: AC
  Filled 2018-06-21: qty 1

## 2018-06-21 MED ORDER — ACETAMINOPHEN 500 MG PO TABS
1000.0000 mg | ORAL_TABLET | ORAL | Status: AC
  Administered 2018-06-21: 1000 mg via ORAL
  Filled 2018-06-21: qty 2

## 2018-06-21 MED ORDER — BUPIVACAINE HCL (PF) 0.5 % IJ SOLN
INTRAMUSCULAR | Status: DC | PRN
  Administered 2018-06-21: 20 mL

## 2018-06-21 MED ORDER — LACTATED RINGERS IV SOLN
INTRAVENOUS | Status: DC
  Administered 2018-06-21: 10:00:00 via INTRAVENOUS

## 2018-06-21 MED ORDER — ROCURONIUM BROMIDE 100 MG/10ML IV SOLN
INTRAVENOUS | Status: AC
  Filled 2018-06-21: qty 2

## 2018-06-21 MED ORDER — SUGAMMADEX SODIUM 200 MG/2ML IV SOLN
INTRAVENOUS | Status: DC | PRN
  Administered 2018-06-21: 300 mg via INTRAVENOUS

## 2018-06-21 MED ORDER — 0.9 % SODIUM CHLORIDE (POUR BTL) OPTIME
TOPICAL | Status: DC | PRN
  Administered 2018-06-21: 1000 mL

## 2018-06-21 MED ORDER — KETOROLAC TROMETHAMINE 30 MG/ML IJ SOLN
30.0000 mg | Freq: Once | INTRAMUSCULAR | Status: AC
  Administered 2018-06-21: 30 mg via INTRAVENOUS

## 2018-06-21 MED ORDER — OXYCODONE HCL 5 MG PO TABS: 5.0000 mg | ORAL_TABLET | Freq: Four times a day (QID) | ORAL | 0 refills | Status: DC | PRN

## 2018-06-21 MED ORDER — ONDANSETRON HCL 4 MG/2ML IJ SOLN
INTRAMUSCULAR | Status: DC | PRN
  Administered 2018-06-21: 4 mg via INTRAVENOUS

## 2018-06-21 MED ORDER — SCOPOLAMINE 1 MG/3DAYS TD PT72
1.0000 | MEDICATED_PATCH | TRANSDERMAL | Status: DC
  Administered 2018-06-21: 1.5 mg via TRANSDERMAL
  Filled 2018-06-21: qty 1

## 2018-06-21 MED ORDER — DEXAMETHASONE SODIUM PHOSPHATE 10 MG/ML IJ SOLN
INTRAMUSCULAR | Status: AC
  Filled 2018-06-21: qty 1

## 2018-06-21 MED ORDER — EPHEDRINE 5 MG/ML INJ
INTRAVENOUS | Status: AC
  Filled 2018-06-21: qty 20

## 2018-06-21 MED ORDER — ONDANSETRON HCL 4 MG/2ML IJ SOLN
INTRAMUSCULAR | Status: AC
  Filled 2018-06-21: qty 4

## 2018-06-21 MED ORDER — MIDAZOLAM HCL 5 MG/5ML IJ SOLN
INTRAMUSCULAR | Status: DC | PRN
  Administered 2018-06-21: 2 mg via INTRAVENOUS

## 2018-06-21 SURGICAL SUPPLY — 32 items
APPLIER CLIP 5 13 M/L LIGAMAX5 (MISCELLANEOUS) ×2
CABLE HIGH FREQUENCY MONO STRZ (ELECTRODE) ×2 IMPLANT
CHLORAPREP W/TINT 26ML (MISCELLANEOUS) ×4 IMPLANT
CLIP APPLIE 5 13 M/L LIGAMAX5 (MISCELLANEOUS) ×1 IMPLANT
COVER MAYO STAND STRL (DRAPES) IMPLANT
COVER WAND RF STERILE (DRAPES) IMPLANT
DECANTER SPIKE VIAL GLASS SM (MISCELLANEOUS) ×2 IMPLANT
DERMABOND ADVANCED (GAUZE/BANDAGES/DRESSINGS) ×1
DERMABOND ADVANCED .7 DNX12 (GAUZE/BANDAGES/DRESSINGS) ×1 IMPLANT
DRAPE C-ARM 42X120 X-RAY (DRAPES) IMPLANT
ELECT REM PT RETURN 15FT ADLT (MISCELLANEOUS) ×2 IMPLANT
GLOVE BIO SURGEON STRL SZ 6.5 (GLOVE) ×2 IMPLANT
GLOVE BIOGEL PI IND STRL 6.5 (GLOVE) ×1 IMPLANT
GLOVE BIOGEL PI INDICATOR 6.5 (GLOVE) ×1
GLOVE ECLIPSE 6.5 STRL STRAW (GLOVE) ×2 IMPLANT
GLOVE SURG SIGNA 7.5 PF LTX (GLOVE) ×2 IMPLANT
GLOVE SURG SS PI 7.0 STRL IVOR (GLOVE) ×2 IMPLANT
GOWN STRL REUS W/TWL XL LVL3 (GOWN DISPOSABLE) ×6 IMPLANT
HEMOSTAT SURGICEL 4X8 (HEMOSTASIS) IMPLANT
KIT BASIN OR (CUSTOM PROCEDURE TRAY) ×2 IMPLANT
POUCH SPECIMEN RETRIEVAL 10MM (ENDOMECHANICALS) ×2 IMPLANT
SCISSORS LAP 5X35 DISP (ENDOMECHANICALS) ×2 IMPLANT
SET CHOLANGIOGRAPH MIX (MISCELLANEOUS) IMPLANT
SET IRRIG TUBING LAPAROSCOPIC (IRRIGATION / IRRIGATOR) ×2 IMPLANT
SET TUBE SMOKE EVAC HIGH FLOW (TUBING) ×2 IMPLANT
SLEEVE XCEL OPT CAN 5 100 (ENDOMECHANICALS) ×4 IMPLANT
SUT MNCRL AB 4-0 PS2 18 (SUTURE) ×2 IMPLANT
TOWEL OR 17X26 10 PK STRL BLUE (TOWEL DISPOSABLE) ×2 IMPLANT
TOWEL OR NON WOVEN STRL DISP B (DISPOSABLE) ×2 IMPLANT
TRAY LAPAROSCOPIC (CUSTOM PROCEDURE TRAY) ×2 IMPLANT
TROCAR BLADELESS OPT 5 100 (ENDOMECHANICALS) ×2 IMPLANT
TROCAR XCEL BLUNT TIP 100MML (ENDOMECHANICALS) ×2 IMPLANT

## 2018-06-21 NOTE — Anesthesia Procedure Notes (Signed)
Procedure Name: Intubation Date/Time: 06/21/2018 11:02 AM Performed by: Mitzie Na, CRNA Pre-anesthesia Checklist: Patient identified, Emergency Drugs available, Suction available, Patient being monitored and Timeout performed Patient Re-evaluated:Patient Re-evaluated prior to induction Oxygen Delivery Method: Circle system utilized Preoxygenation: Pre-oxygenation with 100% oxygen Induction Type: IV induction and Rapid sequence Ventilation: Mask ventilation without difficulty Laryngoscope Size: Mac and 4 Grade View: Grade I Tube type: Oral Tube size: 7.5 mm Number of attempts: 1 Airway Equipment and Method: Stylet Placement Confirmation: ETT inserted through vocal cords under direct vision,  positive ETCO2 and breath sounds checked- equal and bilateral Secured at: 24 cm Tube secured with: Tape Dental Injury: Teeth and Oropharynx as per pre-operative assessment

## 2018-06-21 NOTE — Op Note (Signed)

## 2018-06-21 NOTE — Discharge Instructions (Signed)
[facility] ______[facility] LAPAROSCOPIC SURGERY: POST OP INSTRUCTIONS Always review your discharge instruction sheet given to you by the facility where your surgery was performed. IF YOU HAVE DISABILITY OR FAMILY LEAVE FORMS, YOU MUST BRING THEM TO THE OFFICE FOR PROCESSING.   DO NOT GIVE THEM TO YOUR DOCTOR.  1. A prescription for pain medication may be given to you upon discharge.  Take your pain medication as prescribed, if needed.  If narcotic pain medicine is not needed, then you may take acetaminophen (Tylenol) or ibuprofen (Advil) as needed. 2. Take your usually prescribed medications unless otherwise directed. 3. If you need a refill on your pain medication, please contact your pharmacy.  They will contact our office to request authorization. Prescriptions will not be filled after 5pm or on week-ends. 4. You should follow a light diet the first few days after arrival home, such as soup and crackers, etc.  Be sure to include lots of fluids daily. 5. Most patients will experience some swelling and bruising in the area of the incisions.  Ice packs will help.  Swelling and bruising can take several days to resolve.  6. It is common to experience some constipation if taking pain medication after surgery.  Increasing fluid intake and taking a stool softener (such as Colace) will usually help or prevent this problem from occurring.  A mild laxative (Milk of Magnesia or Miralax) should be taken according to package instructions if there are no bowel movements after 48 hours. 7. Unless discharge instructions indicate otherwise, you may remove your bandages 24-48 hours after surgery, and you may shower at that time.  You may have steri-strips (small skin tapes) in place directly over the incision.  These strips should be left on the skin for 7-10 days.  If your surgeon used skin glue on the incision, you may shower in 24 hours.  The glue will flake off over the next 2-3 weeks.  Any sutures or  staples will be removed at the office during your follow-up visit. 8. ACTIVITIES:  You may resume regular (light) daily activities beginning the next day--such as daily self-care, walking, climbing stairs--gradually increasing activities as tolerated.  You may have sexual intercourse when it is comfortable.  Refrain from any heavy lifting or straining until approved by your doctor. a. You may drive when you are no longer taking prescription pain medication, you can comfortably wear a seatbelt, and you can safely maneuver your car and apply brakes. b. RETURN TO WORK:  __________________________________________________________ 9. You should see your doctor in the office for a follow-up appointment approximately 2-3 weeks after your surgery.  Make sure that you call for this appointment within a day or two after you arrive home to insure a convenient appointment time. 10. OTHER INSTRUCTIONS:NO LIFTING MORE THAN 15 POUNDS FOR 4 WEEKS 11. ICE PACK, TYLENOL ALSO FOR PAIN 12. OK TO SHOWER STARTING TOMORROW __________________________________________________________________________________________________________________________ __________________________________________________________________________________________________________________________ WHEN TO CALL YOUR DOCTOR: 1. Fever over 101.0 2. Inability to urinate 3. Continued bleeding from incision. 4. Increased pain, redness, or drainage from the incision. 5. Increasing abdominal pain  The clinic staff is available to answer your questions during regular business hours.  Please dont hesitate to call and ask to speak to one of the nurses for clinical concerns.  If you have a medical emergency, go to the nearest emergency room or call 911.  [facility] is always on call at the hospital. [address], [address], [place], [place]  [address] ? [address] [address], [place], [place]   [address] [phone] ? [phone] ? FAX  [phone] Web site: [email or web address]    General Anesthesia, Adult, Care After This sheet gives you information about how to care for yourself after your procedure. Your health care provider may also give you more specific instructions. If you have problems or questions, contact your health care provider. What can I expect after the procedure? After the procedure, the following side effects are common:  Pain or discomfort at the IV site.  Nausea.  Vomiting.  Sore throat.  Trouble concentrating.  Feeling cold or chills.  Weak or tired.  Sleepiness and fatigue.  Soreness and body aches. These side effects can affect parts of the body that were not involved in surgery. Follow these instructions at home:  For at least 24 hours after the procedure:  Have a responsible adult stay with you. It is important to have someone help care for you until you are awake and alert.  Rest as needed.  Do not: ? Participate in activities in which you could fall or become injured. ? Drive. ? Use heavy machinery. ? Drink alcohol. ? Take sleeping pills or medicines that cause drowsiness. ? Make important decisions or sign legal documents. ? Take care of children on your own. Eating and drinking  Follow any instructions from your health care provider about eating or drinking restrictions.  When you feel hungry, start by eating small amounts of foods that are soft and easy to digest (bland), such as toast. Gradually return to your regular diet.  Drink enough fluid to keep your urine pale yellow.  If you vomit, rehydrate by drinking water, juice, or clear broth. General instructions  If you have sleep apnea, surgery and certain medicines can increase your risk for breathing problems. Follow instructions from your health care provider about wearing your sleep device: ? Anytime you are sleeping, including during daytime naps. ? While taking prescription pain medicines,  sleeping medicines, or medicines that make you drowsy.  Return to your normal activities as told by your health care provider. Ask your health care provider what activities are safe for you.  Take over-the-counter and prescription medicines only as told by your health care provider.  If you smoke, do not smoke without supervision.  Keep all follow-up visits as told by your health care provider. This is important. Contact a health care provider if:  You have nausea or vomiting that does not get better with medicine.  You cannot eat or drink without vomiting.  You have pain that does not get better with medicine.  You are unable to pass urine.  You develop a skin rash.  You have a fever.  You have redness around your IV site that gets worse. Get help right away if:  You have difficulty breathing.  You have chest pain.  You have blood in your urine or stool, or you vomit blood. Summary  After the procedure, it is common to have a sore throat or nausea. It is also common to feel tired.  Have a responsible adult stay with you for the first 24 hours after general anesthesia. It is important to have someone help care for you until you are awake and alert.  When you feel hungry, start by eating small amounts of foods that are soft and easy to digest (bland), such as toast. Gradually return to your regular diet.  Drink enough fluid to keep your urine pale yellow.  Return to  your normal activities as told by your health care provider. Ask your health care provider what activities are safe for you. This information is not intended to replace advice given to you by your health care provider. Make sure you discuss any questions you have with your health care provider. Document Released: 07/12/2000 Document Revised: 11/19/2016 Document Reviewed: 11/19/2016 Elsevier Interactive Patient Education  2019 [employer].

## 2018-06-21 NOTE — Anesthesia Postprocedure Evaluation (Signed)
Anesthesia Post Note  Patient: Isabella Chelton  Procedure(s) Performed: LAPAROSCOPIC CHOLECYSTECTOMY (N/A Abdomen)     Patient location during evaluation: PACU Anesthesia Type: General Level of consciousness: awake and alert Pain management: pain level controlled Vital Signs Assessment: post-procedure vital signs reviewed and stable Respiratory status: spontaneous breathing, nonlabored ventilation, respiratory function stable and patient connected to nasal cannula oxygen Cardiovascular status: blood pressure returned to baseline and stable Postop Assessment: no apparent nausea or vomiting Anesthetic complications: no    Last Vitals:  Vitals:   06/21/18 1300 06/21/18 1331  BP: (!) 139/96 (!) 134/95  Pulse: 69 68  Resp: 16 16  Temp:  36.5 C  SpO2: 100% 97%    Last Pain:  Vitals:   06/21/18 1331  TempSrc:   PainSc: 3                  Kadeja Granada DANIEL

## 2018-06-21 NOTE — Transfer of Care (Signed)
Immediate Anesthesia Transfer of Care Note  Patient: Kara [name]  Procedure(s) Performed: LAPAROSCOPIC CHOLECYSTECTOMY (N/A Abdomen)  Patient Location: PACU  Anesthesia Type:General  Level of Consciousness: awake, alert , oriented and patient cooperative  Airway & Oxygen Therapy: Patient Spontanous Breathing and Patient connected to face mask oxygen  Post-op Assessment: Report given to RN, Post -op Vital signs reviewed and stable and Patient moving all extremities  Post vital signs: Reviewed and stable  Last Vitals:  Vitals Value Taken Time  BP 130/94 06/21/2018 11:42 AM  Temp    Pulse 76 06/21/2018 11:45 AM  Resp 19 06/21/2018 11:45 AM  SpO2 100 % 06/21/2018 11:45 AM  Vitals shown include unvalidated device data.  Last Pain:  Vitals:   06/21/18 0833  TempSrc: Oral  PainSc: 0-No pain      Patients Stated Pain Goal: 5 (06/21/18 4920)  Complications: No apparent anesthesia complications

## 2018-06-22 ENCOUNTER — Encounter (HOSPITAL_COMMUNITY): Payer: Self-pay | Admitting: Surgery

## 2018-07-11 ENCOUNTER — Ambulatory Visit: Payer: BC Managed Care – PPO | Admitting: Allergy and Immunology

## 2018-07-11 ENCOUNTER — Telehealth: Payer: Self-pay

## 2018-07-11 ENCOUNTER — Encounter: Payer: Self-pay | Admitting: Allergy and Immunology

## 2018-07-11 ENCOUNTER — Other Ambulatory Visit: Payer: Self-pay | Admitting: *Deleted

## 2018-07-11 ENCOUNTER — Other Ambulatory Visit: Payer: Self-pay

## 2018-07-11 VITALS — BP 110/86 | HR 100 | Resp 16 | Ht 65.0 in | Wt 271.0 lb

## 2018-07-11 DIAGNOSIS — J82 Pulmonary eosinophilia, not elsewhere classified: Secondary | ICD-10-CM

## 2018-07-11 DIAGNOSIS — F17210 Nicotine dependence, cigarettes, uncomplicated: Secondary | ICD-10-CM

## 2018-07-11 DIAGNOSIS — K219 Gastro-esophageal reflux disease without esophagitis: Secondary | ICD-10-CM | POA: Diagnosis not present

## 2018-07-11 DIAGNOSIS — Z91018 Allergy to other foods: Secondary | ICD-10-CM

## 2018-07-11 DIAGNOSIS — J3089 Other allergic rhinitis: Secondary | ICD-10-CM

## 2018-07-11 DIAGNOSIS — J8281 Chronic eosinophilic pneumonia: Secondary | ICD-10-CM

## 2018-07-11 DIAGNOSIS — J4550 Severe persistent asthma, uncomplicated: Secondary | ICD-10-CM | POA: Diagnosis not present

## 2018-07-11 MED ORDER — ALBUTEROL SULFATE (2.5 MG/3ML) 0.083% IN NEBU: 2.5000 mg | INHALATION_SOLUTION | RESPIRATORY_TRACT | 1 refills | Status: DC | PRN

## 2018-07-11 MED ORDER — ALBUTEROL SULFATE HFA 108 (90 BASE) MCG/ACT IN AERS: INHALATION_SPRAY | RESPIRATORY_TRACT | 1 refills | Status: DC

## 2018-07-11 MED ORDER — FLUTICASONE FUROATE-VILANTEROL 200-25 MCG/INH IN AEPB: 1.0000 | INHALATION_SPRAY | Freq: Every day | RESPIRATORY_TRACT | 1 refills | Status: DC

## 2018-07-11 MED ORDER — IPRATROPIUM-ALBUTEROL 0.5-2.5 (3) MG/3ML IN SOLN: 3.0000 mL | Freq: Four times a day (QID) | RESPIRATORY_TRACT | 1 refills | Status: DC | PRN

## 2018-07-11 MED ORDER — DUPILUMAB 300 MG/2ML ~~LOC~~ SOSY: 300.0000 mg | PREFILLED_SYRINGE | SUBCUTANEOUS | 10 refills | Status: DC

## 2018-07-11 MED ORDER — EPINEPHRINE 0.3 MG/0.3ML IJ SOAJ: 0.3000 mg | Freq: Once | INTRAMUSCULAR | 1 refills | Status: AC | PRN

## 2018-07-11 MED ORDER — MONTELUKAST SODIUM 10 MG PO TABS: 10.0000 mg | ORAL_TABLET | Freq: Every day | ORAL | 1 refills | Status: DC

## 2018-07-11 NOTE — Progress Notes (Signed)
[place] [facility] [place] [facility] [place] - [place] - [place]   Follow-up Note  Referring Provider: [name], MD Primary Provider: [name], MD Date of Office Visit: 07/11/2018  Subjective:   Kara [name] (DOB: Aug 30, 1985) is a 33 y.o. female who [facility] in re-evaluation of the following:  HPI: Icole returns to this clinic in reevaluation of her severe steroid-dependent asthma and history of eosinophilic pneumonia and allergic rhinoconjunctivitis and reflux and food allergy directed against peanut and tobacco use.  Her last visit to this clinic was 31 May 2017.  During her last visit we started her on dupilumab injections as she had failed mepolizumab injections.  Since starting these injections she has basically had elimination of all respiratory tract symptoms.  She has no issues with her chest or nose.  She does not use a short acting bronchodilator.  She has not required a systemic steroid or an antibiotic to treat any type of airway issue.  She still continues to use Breo and Incruse and montelukast.  Her reflux is under excellent control while using her omeprazole on a consistent basis.  She remains away from consuming any peanut.  She continues to smoke a half a pack of cigarettes a day.  She did obtain a flu vaccine this year.  Allergies as of 07/11/2018      Reactions   Nsaids Other (See Comments)   Upset stomach    Peanuts [peanut Oil] Hives, Itching   "ONLY TREE NUTS"      Medication List      albuterol (2.5 MG/3ML) 0.083% nebulizer solution Commonly known as:  PROVENTIL Take 2.5 mg by nebulization every 4 (four) hours as needed for wheezing or shortness of breath.   ProAir HFA 108 (90 Base) MCG/ACT inhaler Generic drug:  albuterol INHALE 2 PUFFS INTO THE LUNGS EVERY 6 HOURS AS NEEDED FOR WHEEZING OR SHORTNESS OF BREATH   buPROPion HCl ER (XL) 450 MG Tb24 Take 450 mg by mouth daily.    buPROPion 300 MG 24 hr tablet Commonly known as:  WELLBUTRIN XL   calcium-vitamin D 500-200 MG-UNIT tablet Commonly known as:  OSCAL WITH D Take 1 tablet by mouth 2 (two) times daily.   chlorhexidine 0.12 % solution Commonly known as:  PERIDEX Use as directed 15 mLs in the mouth or throat 2 (two) times daily. After meals   cyclobenzaprine 5 MG tablet Commonly known as:  FLEXERIL Take 5 mg by mouth 3 (three) times daily as needed for muscle spasms.   Dupixent 300 MG/2ML Sosy Generic drug:  Dupilumab INJECT 300 MG SUBCUTANEOUSLY EVERY OTHER WEEK   EpiPen 2-Pak 0.3 mg/0.3 mL Soaj injection Generic drug:  EPINEPHrine Inject 0.3 mg into the muscle once as needed (for severe allergic reaction).   fluticasone furoate-vilanterol 200-25 MCG/INH Aepb Commonly known as:  Breo Ellipta Inhale 1 puff into the lungs daily. Patient needs appointment for further refills.   furosemide 20 MG tablet Commonly known as:  LASIX TAKE 1 TABLET BY MOUTH DAILY   gabapentin 300 MG capsule Commonly known as:  NEURONTIN Take 3 capsules (900 mg total) by mouth 2 (two) times daily.   ipratropium-albuterol 0.5-2.5 (3) MG/3ML Soln Commonly known as:  DUONEB Inhale 3 mLs into the lungs every 6 (six) hours as needed (wheezing).   Melatonin 10 MG Tabs Take 10 mg by mouth at bedtime.   metoprolol succinate 50 MG 24 hr tablet Commonly known as:  TOPROL-XL  Take 1 tablet by mouth daily with or immediately following a meal. Please keep upcoming appt with Dr. Eden Emms in December. Thank you   montelukast 10 MG tablet Commonly known as:  SINGULAIR Take 1 tablet (10 mg total) by mouth at bedtime.   multivitamin with minerals tablet Take 1 tablet by mouth daily.   naltrexone 50 MG tablet Commonly known as:  DEPADE Take 50 mg by mouth daily.   omeprazole 40 MG capsule Commonly known as:  PRILOSEC TAKE 1 CAPSULE(40 MG) BY MOUTH DAILY   oxyCODONE 5 MG immediate release tablet Commonly known as:  Oxy  IR/ROXICODONE Take 1 tablet (5 mg total) by mouth every 6 (six) hours as needed for moderate pain or severe pain.   QUEtiapine 50 MG tablet Commonly known as:  SEROQUEL Take 100 mg by mouth at bedtime.   rosuvastatin 10 MG tablet Commonly known as:  CRESTOR Take 10 mg by mouth daily.   sertraline 50 MG tablet Commonly known as:  ZOLOFT Take 75 mg by mouth daily.   thiamine 100 MG tablet Take 100 mg by mouth daily.   traMADol 50 MG tablet Commonly known as:  ULTRAM Take 50 mg by mouth every 6 (six) hours as needed (pain).   umeclidinium bromide 62.5 MCG/INH Aepb Commonly known as:  Incruse Ellipta Inhale 1 puff into the lungs daily.   VITAMIN D PO Take 1 tablet by mouth daily.       Past Medical History:  Diagnosis Date  . Alcoholism in remission (HCC)    per pt in remission since 2017  . Anemia   . AR (allergic rhinitis)    allergist-- dr Lucie Leather  . Biliary dyskinesia   . Diastolic CHF (HCC)    followd by dr Demetrius Charity. Eden Emms  . Environmental allergies   . GERD (gastroesophageal reflux disease)   . Headache   . History of acute pancreatitis    alcoholic pancreatitis ,  2016 and 2017  . History of sepsis 06/12/2016   with CAP  . History of syncope   . 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 hematoma due to coumadin treated with IR embolization of the IMA branches  . Hypertension   . Polyneuropathy   . Prolonged QT syndrome    cardiologist-- dr Eden Emms (notes in epic)  . Severe persistent asthma    pulmonologist-- dr dr Belva Crome (notes in epic)    Past Surgical History:  Procedure Laterality Date  . CHOLECYSTECTOMY N/A 06/21/2018   Procedure: LAPAROSCOPIC CHOLECYSTECTOMY;  Surgeon: Abigail Miyamoto, MD;  Location: [facility];  Service: General;  Laterality: N/A;  . ESOPHAGOGASTRODUODENOSCOPY N/A 07/17/2014   Procedure: ESOPHAGOGASTRODUODENOSCOPY (EGD);  Surgeon: Dorena Cookey, MD;  Location: [facility];   Service: Endoscopy;  Laterality: N/A;  . TYMPANOSTOMY TUBE PLACEMENT Bilateral 11 MONTHS OLD  . VIDEO BRONCHOSCOPY Bilateral 10/08/2016   Procedure: VIDEO BRONCHOSCOPY WITH FLUORO;  Surgeon: Chilton Greathouse, MD;  Location: [facility];  Service: Cardiopulmonary;  Laterality: Bilateral;    Review of systems negative except as noted in HPI / PMHx or noted below:  Review of Systems  Constitutional: Negative.   HENT: Negative.   Eyes: Negative.   Respiratory: Negative.   Cardiovascular: Negative.   Gastrointestinal: Negative.   Genitourinary: Negative.   Musculoskeletal: Negative.   Skin: Negative.   Neurological: Negative.   Endo/Heme/Allergies: Negative.   Psychiatric/Behavioral: Negative.      Objective:   Vitals:   07/11/18 0948  BP:  110/86  Pulse: 100  Resp: 16  SpO2: 97%   Height: 5\' 5"  (165.1 cm)  Weight: 271 lb (122.9 kg)   Physical Exam Constitutional:      Appearance: She is not diaphoretic.  HENT:     Head: Normocephalic.     Right Ear: Tympanic membrane, ear canal and external ear normal.     Left Ear: Tympanic membrane, ear canal and external ear normal.     Nose: Nose normal. No mucosal edema or rhinorrhea.     Mouth/Throat:     Pharynx: Uvula midline. No oropharyngeal exudate.  Eyes:     Conjunctiva/sclera: Conjunctivae normal.  Neck:     Thyroid: No thyromegaly.     Trachea: Trachea normal. No tracheal tenderness or tracheal deviation.  Cardiovascular:     Rate and Rhythm: Normal rate and regular rhythm.     Heart sounds: Normal heart sounds, S1 normal and S2 normal. No murmur.  Pulmonary:     Effort: No respiratory distress.     Breath sounds: Normal breath sounds. No stridor. No wheezing or rales.  Lymphadenopathy:     Head:     Right side of head: No tonsillar adenopathy.     Left side of head: No tonsillar adenopathy.     Cervical: No cervical adenopathy.  Skin:    Findings: No erythema or rash.     Nails: There is no clubbing.    Neurological:     Mental Status: She is alert.     Diagnostics:    Spirometry was performed and demonstrated an FEV1 of 2.33 at 72 % of predicted.  Assessment and Plan:   1. Asthma, severe persistent, well-controlled   2. Eosinophilic pneumonia (HCC)   3. Other allergic rhinitis   4. Gastroesophageal reflux disease, esophagitis presence not specified   5. Food allergy   6. Tobacco smoker, less than 10 cigarettes per day     1. Continue Breo 200 - 1 inhalation 1 time per day  2. Continue montelukast 10 mg one tablet once a day  3. Continue omeprazole 40 mg once a day  4. Continue dupilumab injections every 2 weeks  5. Continue albuterol HFA or similar if needed  6. Continue Auvi-Q 0.3 / EpiPen if needed  7. Continue antihistamine if needed  8. Discontinue Incruse  9. Use nicotine substitutes to replace tobacco smoke exposure.   10. Blood - peanut IgE with reflex. Challenge?    11. Return to clinic in 6 months  Obviously the use of dupilumab has resulted in great control of Chenille's respiratory track eosinophilic driven inflammatory condition and we will continue her on this medication and see if we can consolidate some of her other treatment by initially discontinuing her Incruse.  She will continue on a collection of anti-inflammatory agents for airway and therapy directed against reflux and I once again encouraged her to find a different hobby other than tobacco smoking to address her anxiety.  We will work through her peanut allergy and see if she qualifies for challenge.  Laurette Schimke, MD Allergy / Immunology [facility]

## 2018-07-11 NOTE — Patient Instructions (Addendum)
  1. Continue Breo 200 - 1 inhalation 1 time per day  2. Continue montelukast 10 mg one tablet once a day  3. Continue omeprazole 40 mg once a day  4. Continue dupilumab injections every 2 weeks  5. Continue albuterol HFA or similar if needed  6. Continue Auvi-Q 0.3 / EpiPen if needed  7. Continue antihistamine if needed  8. Discontinue Incruse  9. Use nicotine substitutes to replace tobacco smoke exposure.   10. Blood - peanut IgE with reflex. Challenge?    11. Return to clinic in 6 months

## 2018-07-11 NOTE — Telephone Encounter (Signed)
Patient would like a new order for Dupixent

## 2018-07-11 NOTE — Telephone Encounter (Signed)
RX RENEWAL SENT TO [facility]

## 2018-07-12 ENCOUNTER — Encounter: Payer: Self-pay | Admitting: Allergy and Immunology

## 2018-07-13 LAB — IGE PEANUT W/COMPONENT REFLEX: Peanut, IgE: <0.1 kU/L

## 2018-07-14 ENCOUNTER — Telehealth: Payer: Self-pay | Admitting: Allergy and Immunology

## 2018-07-14 MED ORDER — OMEPRAZOLE 40 MG PO CPDR: DELAYED_RELEASE_CAPSULE | ORAL | 1 refills | Status: DC

## 2018-07-14 MED ORDER — FLUTICASONE FUROATE-VILANTEROL 200-25 MCG/INH IN AEPB: 1.0000 | INHALATION_SPRAY | Freq: Every day | RESPIRATORY_TRACT | 1 refills | Status: DC

## 2018-07-14 NOTE — Telephone Encounter (Signed)
Called pt to get clarity. Advised that omeprazole and breo can be resent but per provider note pt is to discontinue Incruse. Pt states that Dr. Lucie Leather told her to discontinue incruse but to keep it on hand and the only way she can keep it on hand is with a refill. Please advise if refill of Incruse is appropriate.

## 2018-07-14 NOTE — Telephone Encounter (Signed)
Patient was seen 07-11-2018. She said some of her prescriptions were sent in, except omeprezole, Breo Elipta and Encruse. She said she needs a 90 supply of those.

## 2018-07-17 MED ORDER — UMECLIDINIUM BROMIDE 62.5 MCG/INH IN AEPB: 1.0000 | INHALATION_SPRAY | Freq: Every day | RESPIRATORY_TRACT | Status: DC

## 2018-07-17 NOTE — Telephone Encounter (Signed)
Refill for Incruse sent

## 2018-07-17 NOTE — Addendum Note (Signed)
Addended by: Teressa Senter on: 07/17/2018 08:44 AM   Modules accepted: Orders

## 2018-07-17 NOTE — Telephone Encounter (Signed)
Please refill what she asks for, including Incruse. I think the prescription was to be on file.

## 2018-08-07 ENCOUNTER — Other Ambulatory Visit: Payer: Self-pay | Admitting: Cardiovascular Disease

## 2018-10-30 ENCOUNTER — Telehealth: Payer: Self-pay | Admitting: *Deleted

## 2018-10-30 ENCOUNTER — Telehealth: Payer: Self-pay | Admitting: Pulmonary Disease

## 2018-10-30 NOTE — Telephone Encounter (Signed)
Patient is requesting a letter for work stating she is high risk to get Covid-19 and needs letter to work from home due to this condition for her employer. Please advise

## 2018-10-30 NOTE — Telephone Encounter (Signed)
Letter has been typed up placed on Dr Bruna Potter desk for [employer]

## 2018-10-30 NOTE — Telephone Encounter (Signed)
Called and spoke to pt. Pt is requesting a letter to allow her to work from home due to the risk of contracting COVID. Pt states the letter would need to say she is a high risk due to her medical conditions (I.e asthma, history of pna, heart failure, etc). Pt states she works at [employer] and is around students all day. She is requesting this letter to be effective August 1st.   Dr. Vaughan Browner please advise. Thanks.

## 2018-10-30 NOTE — Telephone Encounter (Signed)
Please provide her letter.

## 2018-10-31 NOTE — Telephone Encounter (Signed)
I am fine with this, due for visit. Please schedule with Dr. Vaughan Browner (ok if 1-2 months out and she is stable)

## 2018-10-31 NOTE — Telephone Encounter (Signed)
Letter has been signed by Dr Neldon Mc and patient has been informed. Patient would like the letter mailed to her writer mailed letter

## 2018-10-31 NOTE — Telephone Encounter (Signed)
Called and spoke with pt letting her know that Eustaquio Maize said it was fine for [place] to write letter for her to work from home due to her medical conditions. Pt verbalized understanding. Asked if she wanted to come by office to pick it up or have it mailed and pt said she would print it off from [employer].  Also stated to pt that we needed to schedule f/u as she was due for a visit and pt verbalized understanding. Pt has been scheduled for an OV with Dr. Vaughan Browner 8/19 at 3:45. Nothing further needed.

## 2018-10-31 NOTE — Telephone Encounter (Signed)
With Dr. Vaughan Browner currently working at the hospital, sending to APP that last saw pt prior to Dr. Judy Pimple, please advise if you are okay with [place] writing the letter for pt. Thanks!

## 2018-11-02 ENCOUNTER — Telehealth: Payer: Self-pay | Admitting: Pulmonary Disease

## 2018-11-02 NOTE — Telephone Encounter (Signed)
[email or web address].Letter has been signed and placed upfront in pick up file cabinet. Will await a call back to see if patient would like the letter mailed, faxed, or will she be picking it up.

## 2018-11-02 NOTE — Telephone Encounter (Signed)
Yup, thanks brittany

## 2018-11-02 NOTE — Telephone Encounter (Signed)
Kara [name] is this okay for the letter?  To whom it may concern:  This patient is seen in our office for Chronic Asthma. In my medical opinion she is at high risk for contracting pulmonary complications if exposed to the COVID-19. I do not recommend she be placed in a position where she can be exposed to COVID-19.

## 2018-11-02 NOTE — Telephone Encounter (Signed)
Ok to write a letter stating that we recommend working from home as much as possible.

## 2018-11-02 NOTE — Telephone Encounter (Signed)
Pt called back and stated she would like the letter to be mailed to her. Letter will be placed in mail by [place]. Nothing further needed.

## 2018-11-02 NOTE — Telephone Encounter (Signed)
Checked with [place]. The letter is in the file cabinet up front. Sent [place] a message letting her know that pt would like to have letter mailed and [place] stated she would place letter in mail for pt.  Called and spoke with pt letting her know that we were going to place letter in mail at her request and pt verbalized understanding. Nothing further needed.

## 2018-12-06 ENCOUNTER — Ambulatory Visit: Payer: BC Managed Care – PPO | Admitting: Pulmonary Disease

## 2018-12-06 ENCOUNTER — Ambulatory Visit: Payer: BC Managed Care – PPO | Admitting: Acute Care

## 2018-12-06 NOTE — Progress Notes (Deleted)
History of Present Illness Kara [name] is a 33 y.o. female current every day smoker ( 1/2 PPD)  with severe  asthma and history of eosinophilic pneumonia currently well controlled on dupilumab injections ( Dr. Neldon Mc),  Memory Dance, Incruse and montelukast. She is followed by Dr. Vaughan Browner.  Synopsis Started her on dupilumab injections 05/2017 as she had failed mepolizumab injections.  Since starting these injections she has basically had elimination of all respiratory tract symptoms.  She has no issues with her chest or nose.  She does not use a short acting bronchodilator.  She has not required a systemic steroid or an antibiotic to treat any type of airway issue.  She still continues to use Breo and Incruse and montelukast.  Last OV to Pulmonary was 10/2017 for a flare with exception of calling the office requesting  a letter stating she needs to work from home during [place] pandemic.    12/06/2018  Annual Follow up Appointment Pt presents for follow up. She states she has been doing well and is compliant with her dupilumab injections  Test Results:  CBC Latest Ref Rng & Units 06/20/2018 06/20/2017 09/01/2016  WBC 4.0 - 10.5 K/uL 7.8 16.0(H) 12.2(H)  Hemoglobin 12.0 - 15.0 g/dL 13.1 14.1 12.1  Hematocrit 36.0 - 46.0 % 42.6 41.0 39.1  Platelets 150 - 400 K/uL 214 230 227    BMP Latest Ref Rng & Units 06/20/2018 10/15/2016 09/20/2016  Glucose 70 - 99 mg/dL 106(H) 103(H) -  BUN 6 - 20 mg/dL 6 11 -  Creatinine 0.44 - 1.00 mg/dL 0.75 0.84 -  BUN/Creat Ratio 9 - 23 - 13 -  Sodium 135 - 145 mmol/L 138 139 -  Potassium 3.5 - 5.1 mmol/L 4.5 4.3 -  Chloride 98 - 111 mmol/L 103 97 -  CO2 22 - 32 mmol/L 27 24 -  Calcium 8.9 - 10.3 mg/dL 9.2 9.8 9.3    BNP    Component Value Date/Time   BNP 21.7 06/12/2016 2136    ProBNP No results found for: PROBNP  PFT    Component Value Date/Time   FEV1PRE 2.11 09/22/2016 0957   FEV1POST 2.36 09/22/2016 0957   FVCPRE 2.75 09/22/2016 0957   FVCPOST 2.89  09/22/2016 0957   TLC 4.38 09/22/2016 0957   DLCOUNC 22.43 09/22/2016 0957   PREFEV1FVCRT 77 09/22/2016 0957   PSTFEV1FVCRT 82 09/22/2016 0957    No results found.   Past medical hx Past Medical History:  Diagnosis Date  . Alcoholism in remission ([place])    per pt in remission since 2017  . Anemia   . AR (allergic rhinitis)    allergist-- dr Neldon Mc  . Biliary dyskinesia   . Diastolic CHF ([place])    followd by dr Mamie Nick. Johnsie Cancel  . Environmental allergies   . GERD (gastroesophageal reflux disease)   . Headache   . History of acute pancreatitis    alcoholic pancreatitis ,  9563 and 2017  . History of sepsis 06/12/2016   with CAP  . History of syncope   . 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 hematoma due to coumadin treated with IR embolization of the IMA branches  . Hypertension   . Polyneuropathy   . Prolonged QT syndrome    cardiologist-- dr Johnsie Cancel (notes in epic)  . Severe persistent asthma    pulmonologist-- dr dr Bernita Raisin (notes in epic)     Social History   Tobacco Use  .  Smoking status: Current Every Day Smoker    Packs/day: 0.50    Years: 5.00    Pack years: 2.50    Types: Cigarettes  . Smokeless tobacco: Never Used  Substance Use Topics  . Alcohol use: Not Currently    Alcohol/week: 0.0 standard drinks    Comment: pt hx alcohol abuse , in remission since 2017  . 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 She has a 2.50 pack-year smoking history. She has never used smokeless tobacco. She reports previous alcohol use. She reports previous drug use.  Tobacco Cessation: Ready to quit: Not Answered Counseling given: Not Answered   Past surgical hx, Family hx, Social hx all reviewed.  Current Outpatient Medications on File Prior to Visit  Medication Sig  . albuterol (PROAIR HFA) 108 (90 Base) MCG/ACT inhaler  INHALE 2 PUFFS INTO THE LUNGS EVERY 6 HOURS AS NEEDED FOR WHEEZING OR SHORTNESS OF BREATH  . albuterol (PROVENTIL) (2.5 MG/3ML) 0.083% nebulizer solution Take 3 mLs (2.5 mg total) by nebulization every 4 (four) hours as needed for wheezing or shortness of breath.  Marland Kitchen. buPROPion (WELLBUTRIN XL) 300 MG 24 hr tablet   . buPROPion HCl ER, XL, 450 MG TB24 Take 450 mg by mouth daily.   . calcium-vitamin D (OSCAL WITH D) 500-200 MG-UNIT tablet Take 1 tablet by mouth 2 (two) times daily.  . chlorhexidine (PERIDEX) 0.12 % solution Use as directed 15 mLs in the mouth or throat 2 (two) times daily. After meals  . Cholecalciferol (VITAMIN D PO) Take 1 tablet by mouth daily.   . cyclobenzaprine (FLEXERIL) 5 MG tablet Take 5 mg by mouth 3 (three) times daily as needed for muscle spasms.   . Dupilumab (DUPIXENT) 300 MG/2ML SOSY Inject 300 mg into the skin every 14 (fourteen) days.  Marland Kitchen. EPINEPHrine (EPIPEN 2-PAK) 0.3 mg/0.3 mL IJ SOAJ injection Inject 0.3 mLs (0.3 mg total) into the muscle once as needed (for severe allergic reaction).  . fluticasone furoate-vilanterol (BREO ELLIPTA) 200-25 MCG/INH AEPB Inhale 1 puff into the lungs daily.  . furosemide (LASIX) 20 MG tablet TAKE 1 TABLET BY MOUTH DAILY  . gabapentin (NEURONTIN) 300 MG capsule Take 3 capsules (900 mg total) by mouth 2 (two) times daily.  Marland Kitchen. ipratropium-albuterol (DUONEB) 0.5-2.5 (3) MG/3ML SOLN Inhale 3 mLs into the lungs every 6 (six) hours as needed (wheezing).  . Melatonin 10 MG TABS Take 10 mg by mouth at bedtime.  . metoprolol succinate (TOPROL-XL) 50 MG 24 hr tablet Take 1 tablet by mouth daily with or immediately following a meal. Please keep upcoming appt with Dr. Eden EmmsNishan in December. Thank you  . montelukast (SINGULAIR) 10 MG tablet Take 1 tablet (10 mg total) by mouth at bedtime.  . Multiple Vitamins-Minerals (MULTIVITAMIN WITH MINERALS) tablet Take 1 tablet by mouth daily.  . naltrexone (DEPADE) 50 MG tablet Take 50 mg by mouth daily.  Marland Kitchen.  omeprazole (PRILOSEC) 40 MG capsule TAKE 1 CAPSULE(40 MG) BY MOUTH DAILY  . oxyCODONE (OXY IR/ROXICODONE) 5 MG immediate release tablet Take 1 tablet (5 mg total) by mouth every 6 (six) hours as needed for moderate pain or severe pain.  Marland Kitchen. QUEtiapine (SEROQUEL) 50 MG tablet Take 100 mg by mouth at bedtime.   . rosuvastatin (CRESTOR) 10 MG tablet Take 10 mg by mouth daily.  . sertraline (ZOLOFT) 50 MG tablet Take 75 mg by mouth daily.  .Marland Kitchen  thiamine 100 MG tablet Take 100 mg by mouth daily.  . traMADol (ULTRAM) 50 MG tablet Take 50 mg by mouth every 6 (six) hours as needed (pain).    Current Facility-Administered Medications on File Prior to Visit  Medication  . umeclidinium bromide (INCRUSE ELLIPTA) 62.5 MCG/INH 1 puff     Allergies  Allergen Reactions  . Nsaids Other (See Comments)    Upset stomach   . Peanuts [Peanut Oil] Hives and Itching    "ONLY TREE NUTS"    Review Of Systems:  Constitutional:   No  weight loss, night sweats,  Fevers, chills, fatigue, or  lassitude.  HEENT:   No headaches,  Difficulty swallowing,  Tooth/dental problems, or  Sore throat,                No sneezing, itching, ear ache, nasal congestion, post nasal drip,   CV:  No chest pain,  Orthopnea, PND, swelling in lower extremities, anasarca, dizziness, palpitations, syncope.   GI  No heartburn, indigestion, abdominal pain, nausea, vomiting, diarrhea, change in bowel habits, loss of appetite, bloody stools.   Resp: No shortness of breath with exertion or at rest.  No excess mucus, no productive cough,  No non-productive cough,  No coughing up of blood.  No change in color of mucus.  No wheezing.  No chest wall deformity  Skin: no rash or lesions.  GU: no dysuria, change in color of urine, no urgency or frequency.  No flank pain, no hematuria   MS:  No joint pain or swelling.  No decreased range of motion.  No back pain.  Psych:  No change in mood or affect. No depression or anxiety.  No memory loss.    Vital Signs There were no vitals taken for this visit.   Physical Exam:  General- No distress,  A&Ox3 ENT: No sinus tenderness, TM clear, pale nasal mucosa, no oral exudate,no post nasal drip, no LAN Cardiac: S1, S2, regular rate and rhythm, no murmur Chest: No wheeze/ rales/ dullness; no accessory muscle use, no nasal flaring, no sternal retractions Abd.: Soft Non-tender Ext: No clubbing cyanosis, edema Neuro:  normal strength Skin: No rashes, warm and dry Psych: normal mood and behavior   Assessment/Plan  No problem-specific Assessment & Plan notes found for this encounter.    Bevelyn NgoSarah F Groce, NP 12/06/2018  4:07 PM

## 2018-12-08 IMAGING — CR DG CHEST 2V
2 series · 2 of 2 positions shown · non-contrast
Comparison: Chest radiograph June 12, 2016

CLINICAL DATA: Dyspnea and shortness of breath. Admitted to ICU for
pneumonia 2 months prior.

EXAM:
CHEST  2 VIEW

[w chest lat]
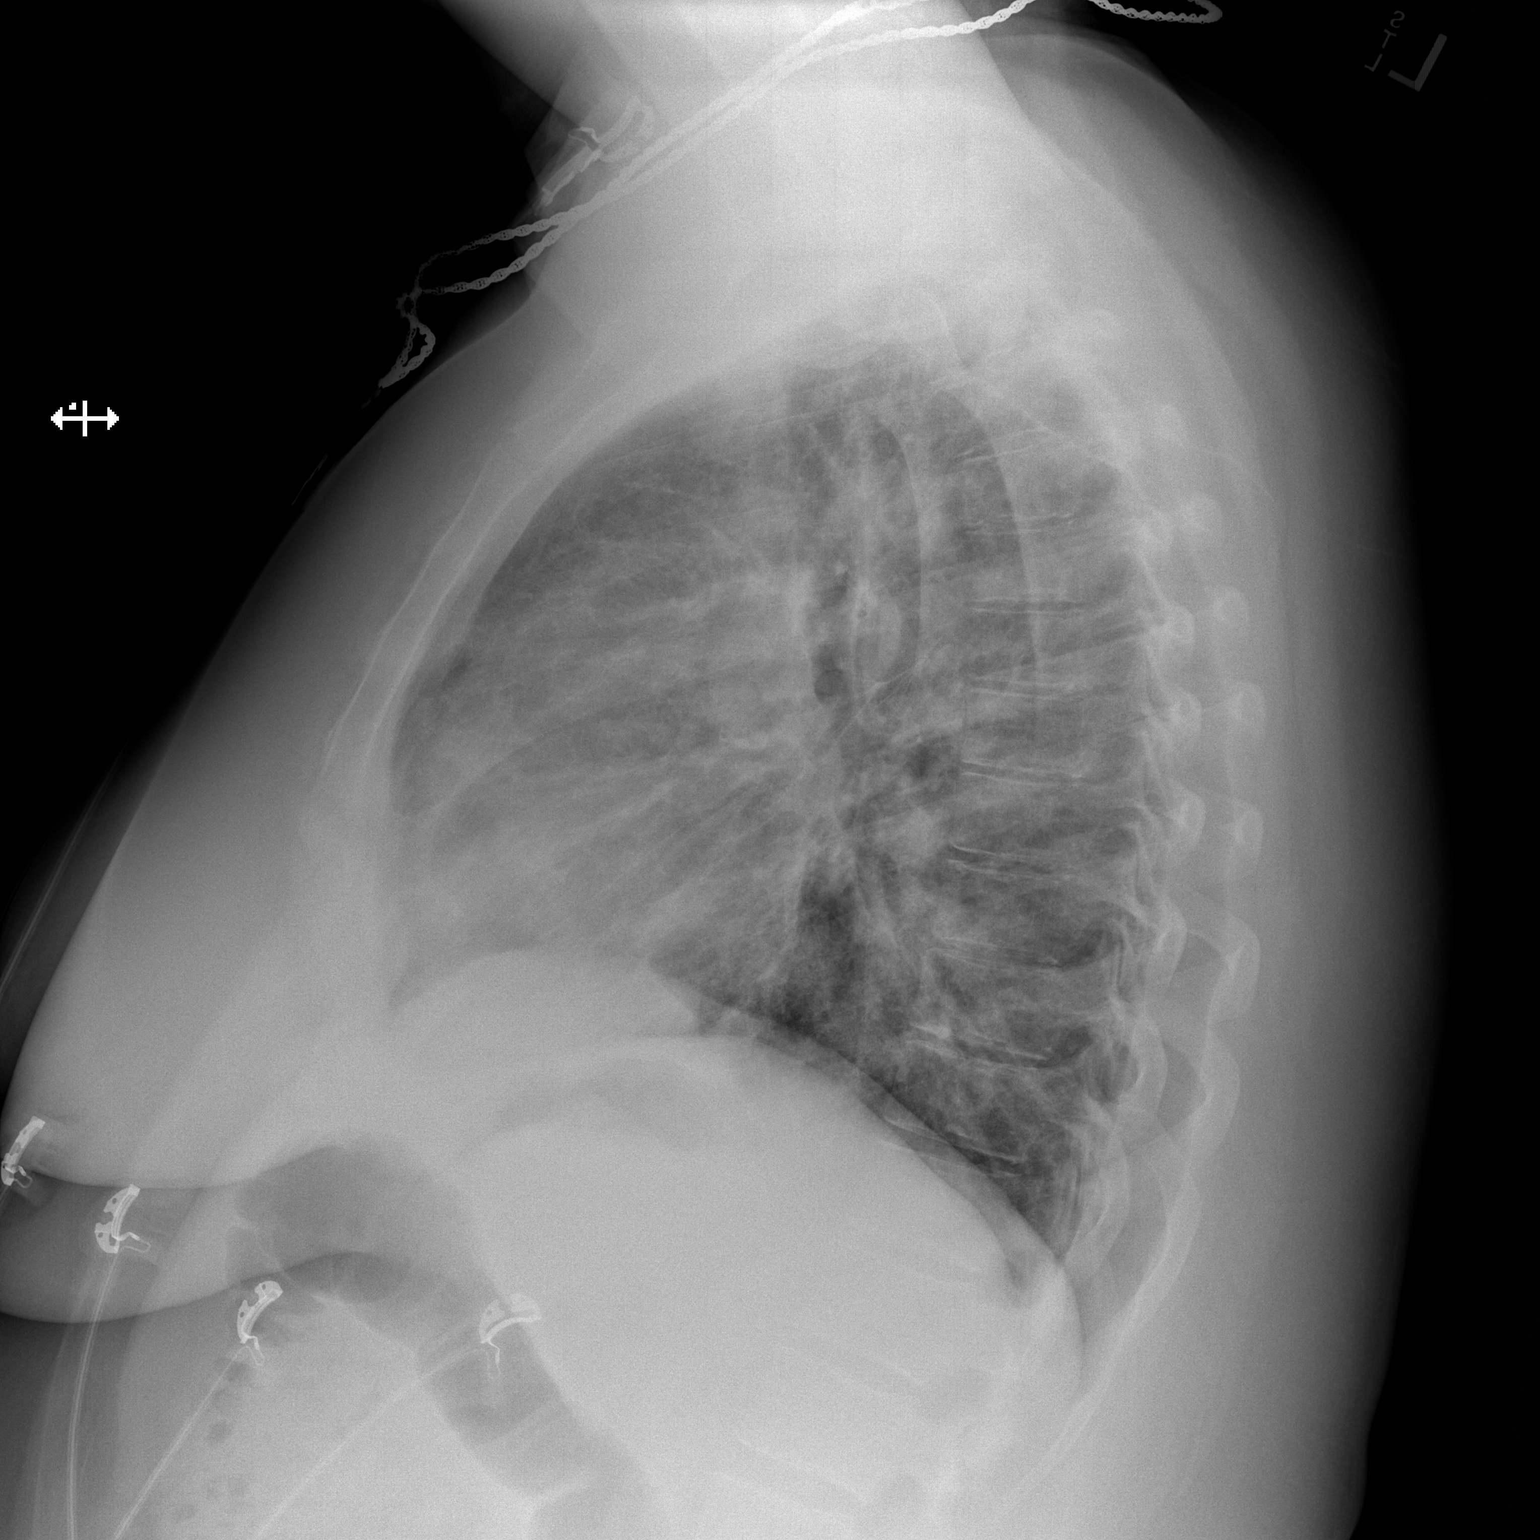

[w chest pa]
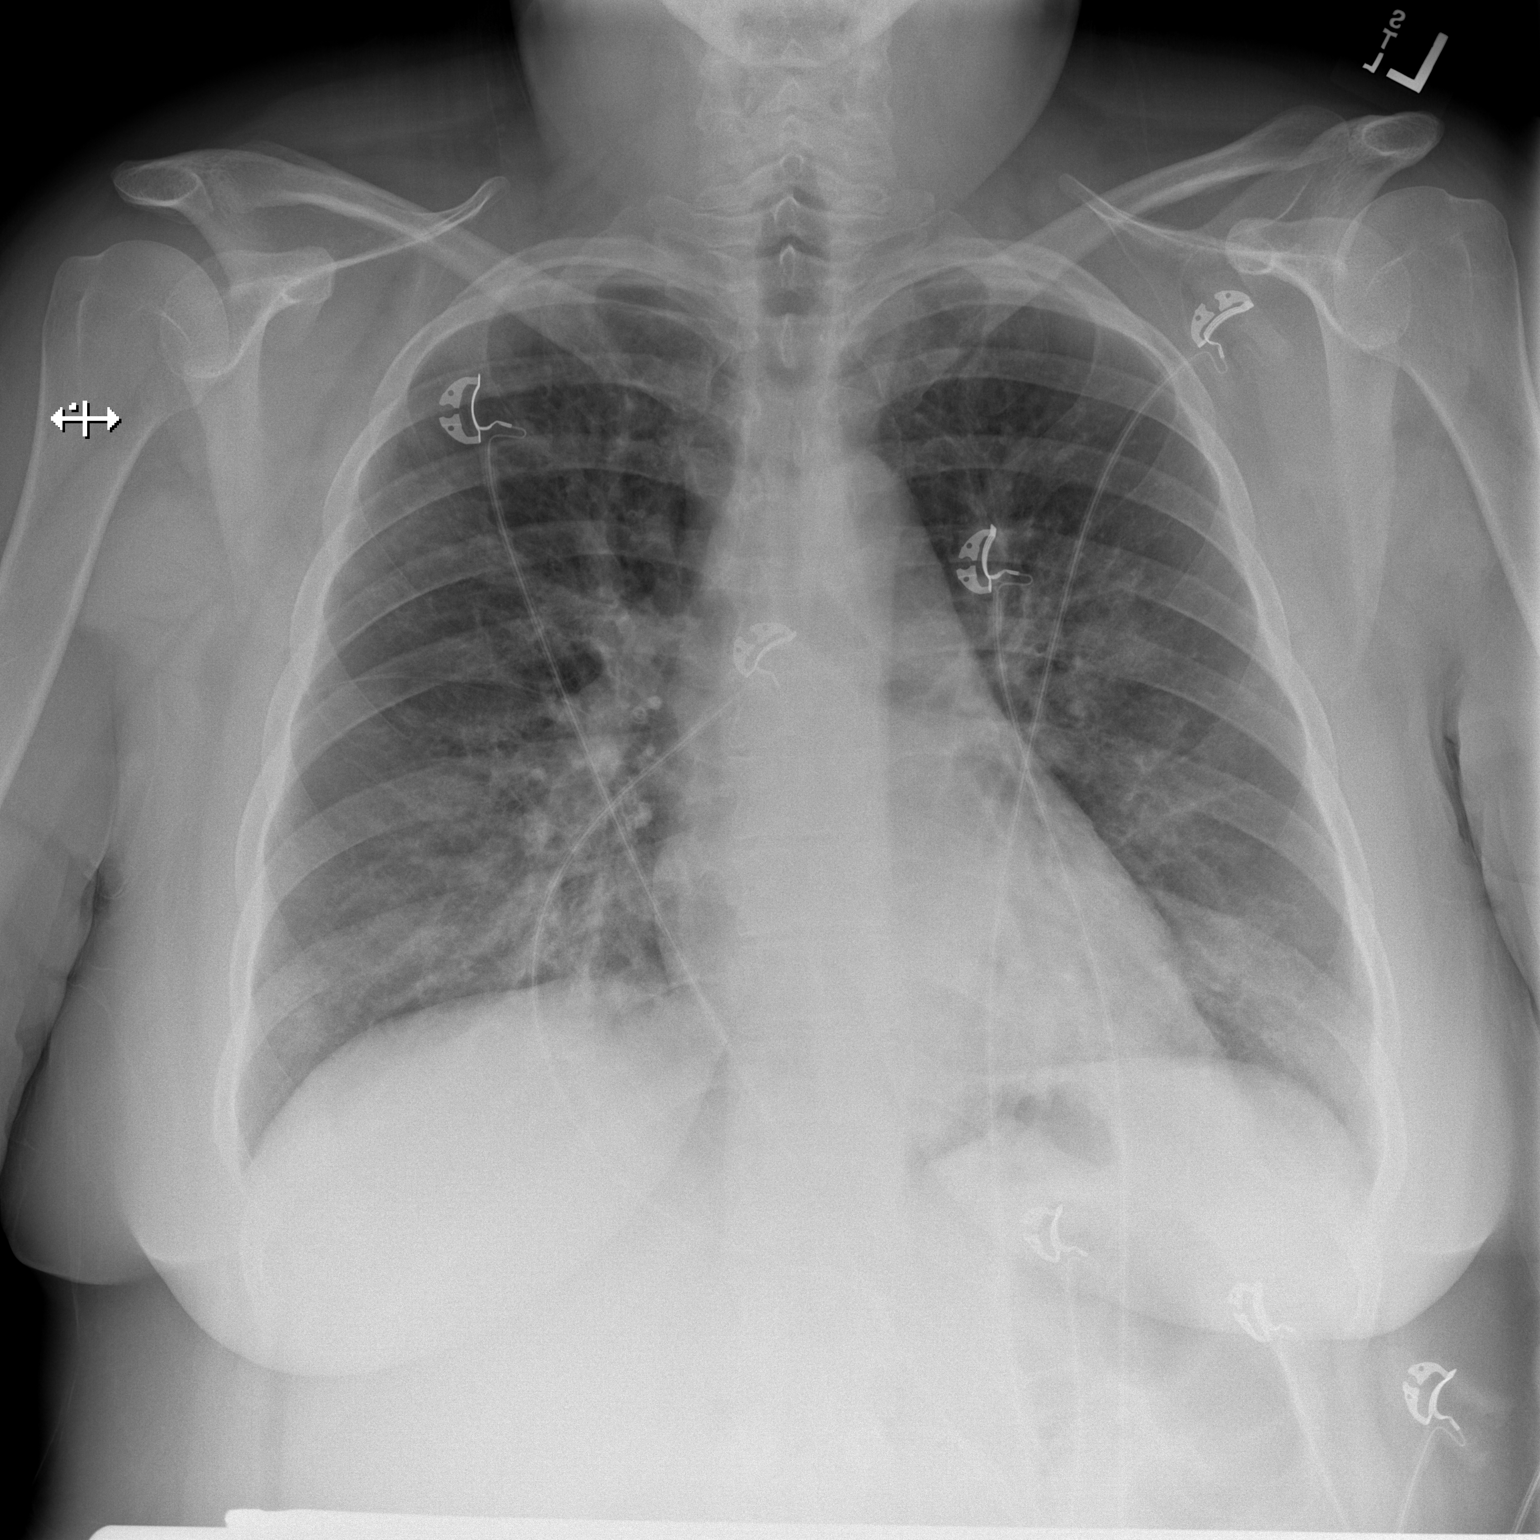

[2 of 2 positions shown; findings below may reference images not displayed]

FINDINGS: Cardiomediastinal silhouette is normal. Diffuse mild interstitial
prominence with faint patchy airspace opacities similar to slightly
improved. No pleural effusion. No pneumothorax. Soft tissue planes
included osseous structures are normal, large body habitus.
IMPRESSION: Interstitial and alveolar airspace opacities concerning for residual
versus recurrent bronchopneumonia.

## 2018-12-12 ENCOUNTER — Other Ambulatory Visit: Payer: Self-pay

## 2018-12-12 MED ORDER — OMEPRAZOLE 40 MG PO CPDR: DELAYED_RELEASE_CAPSULE | ORAL | 0 refills | Status: DC

## 2018-12-12 NOTE — Telephone Encounter (Signed)
Final courtesy refill sent until patient can be seen in office or have a telephone visit.

## 2018-12-18 ENCOUNTER — Other Ambulatory Visit: Payer: Self-pay | Admitting: Allergy and Immunology

## 2018-12-26 ENCOUNTER — Ambulatory Visit: Payer: BC Managed Care – PPO | Admitting: Neurology

## 2018-12-27 ENCOUNTER — Other Ambulatory Visit: Payer: Self-pay | Admitting: Neurology

## 2019-01-01 ENCOUNTER — Ambulatory Visit: Payer: BC Managed Care – PPO | Admitting: Neurology

## 2019-01-02 ENCOUNTER — Encounter: Payer: Self-pay | Admitting: Neurology

## 2019-01-02 ENCOUNTER — Other Ambulatory Visit: Payer: Self-pay | Admitting: Allergy and Immunology

## 2019-01-04 IMAGING — CR DG CHEST 2V
2 series · 2 of 2 positions shown · non-contrast
Comparison: Most recent radiographs 08/01/2016, chest CT 06/13/2016

CLINICAL DATA: Shortness of breath.  Productive cough.

EXAM:
CHEST  2 VIEW

[w chest pa]
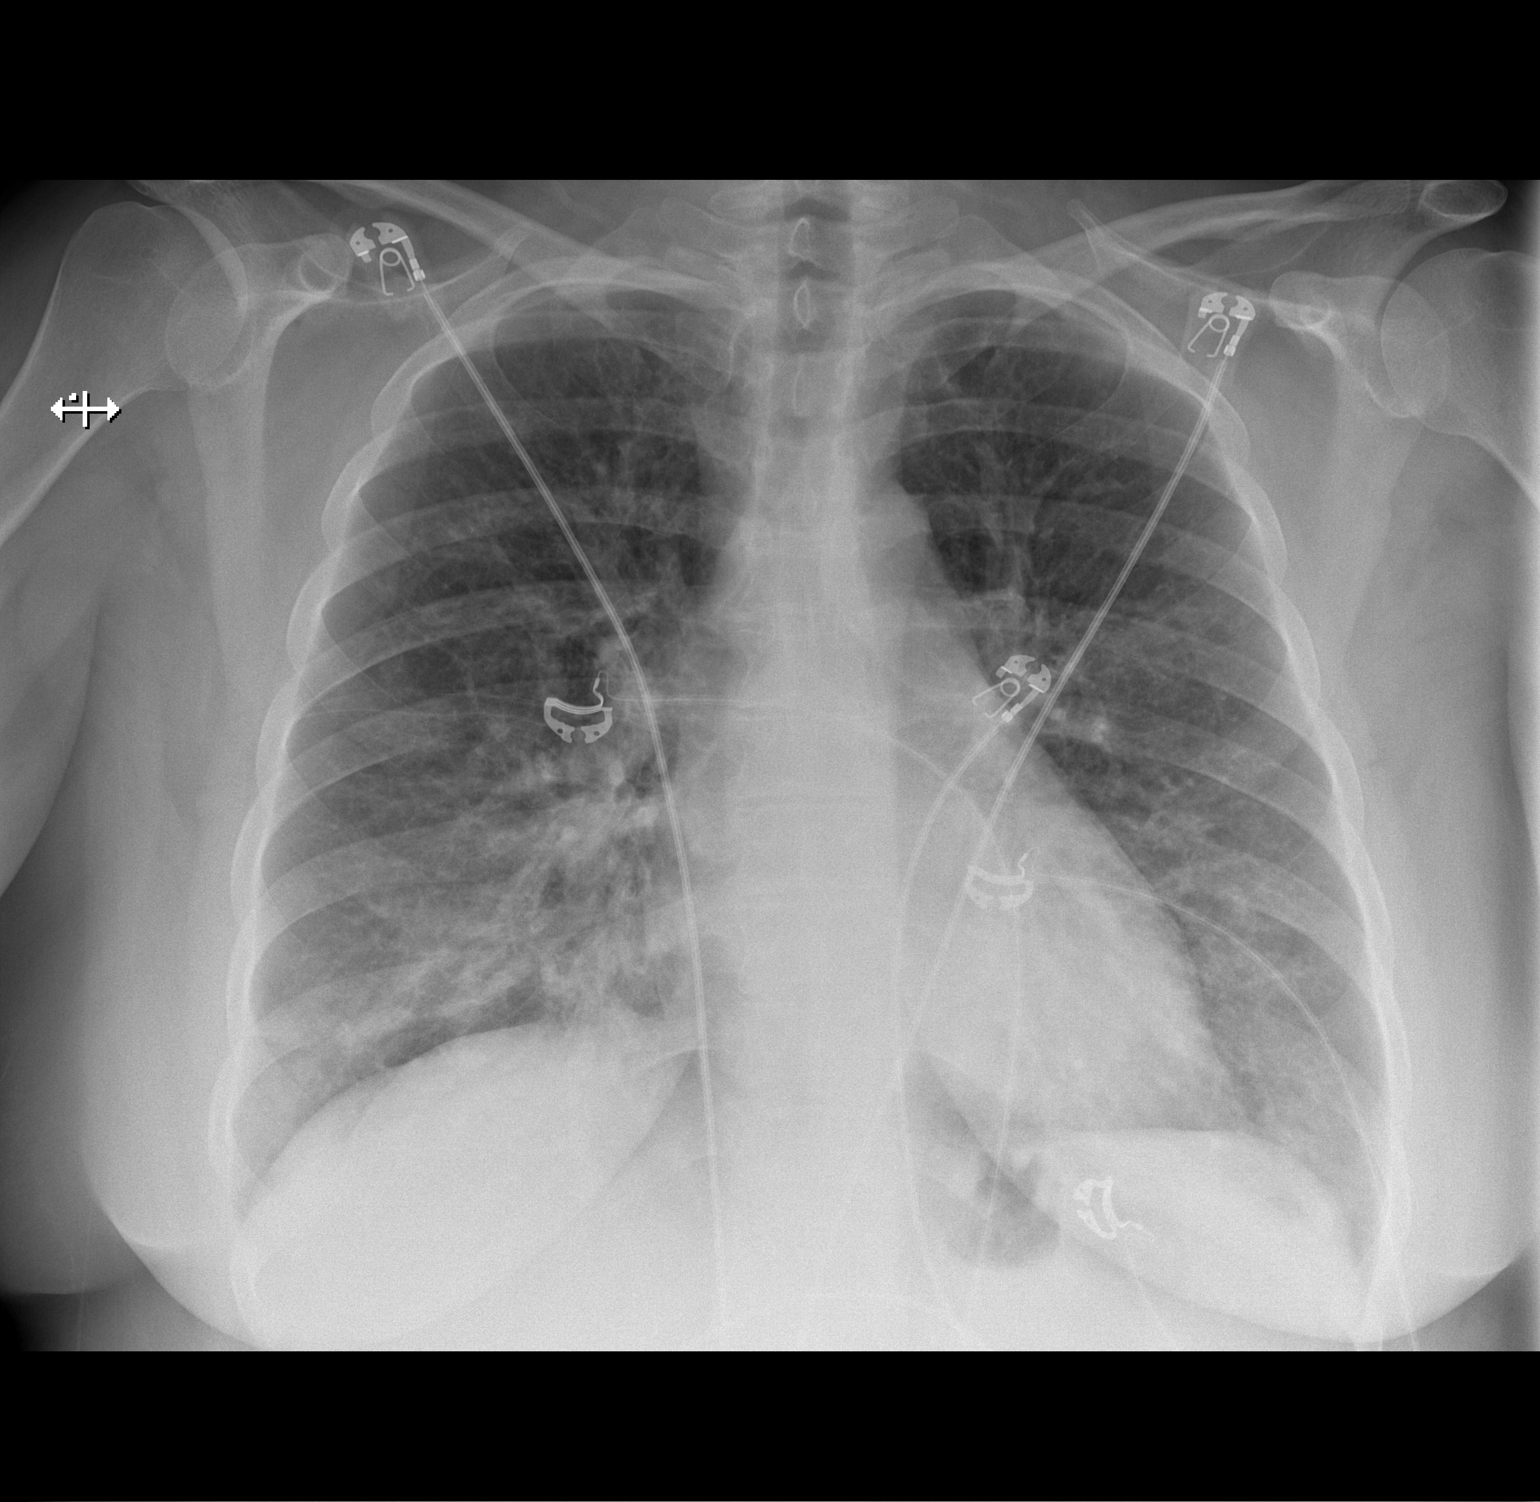

[w chest lat]
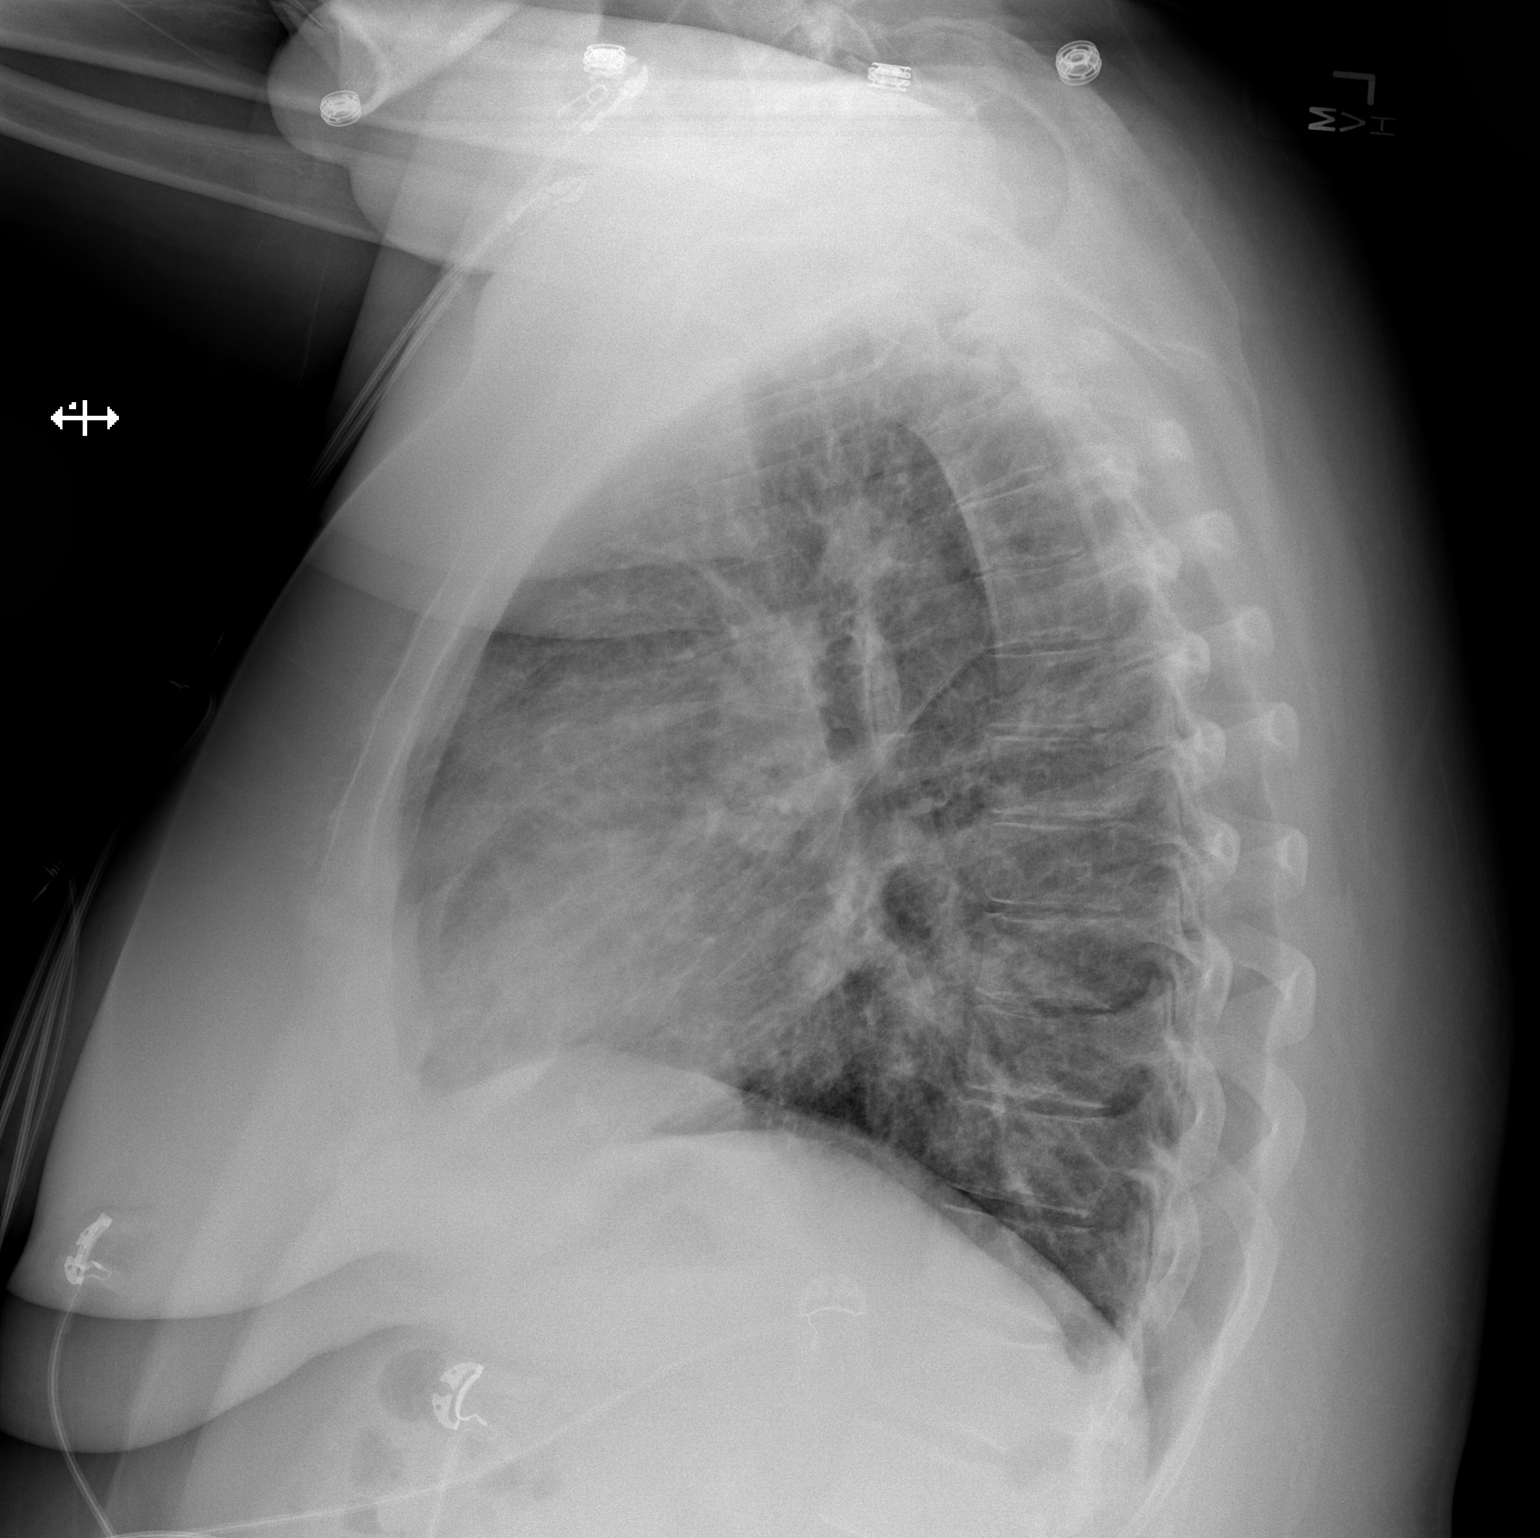

[2 of 2 positions shown; findings below may reference images not displayed]

FINDINGS: Unchanged heart size and mediastinal contours. There are persistent
bilateral heterogeneous airspace opacities in a lower lung zone
predominant distribution. Assess slightly progressed at the right
lung base. Possible trace right pleural effusion. No pneumothorax.
No acute osseous abnormalities.
IMPRESSION: Persistent bilateral heterogeneous airspace opacities, progressed at
the right lung base. In conjunction with prior chest CT, findings
can be seen in the setting of pulmonary hemorrhage or interstitial
lung disease. Atypical infection could have a similar appearance.

## 2019-01-09 ENCOUNTER — Ambulatory Visit: Payer: BC Managed Care – PPO | Admitting: Allergy and Immunology

## 2019-01-18 IMAGING — DX DG CHEST 2V
2 series · 2 of 2 positions shown · non-contrast
Comparison: 08/31/2016 and prior radiograph

CLINICAL DATA: Cough and congestion.  Follow-up pneumonia.

EXAM:
CHEST  2 VIEW

[chest pa]
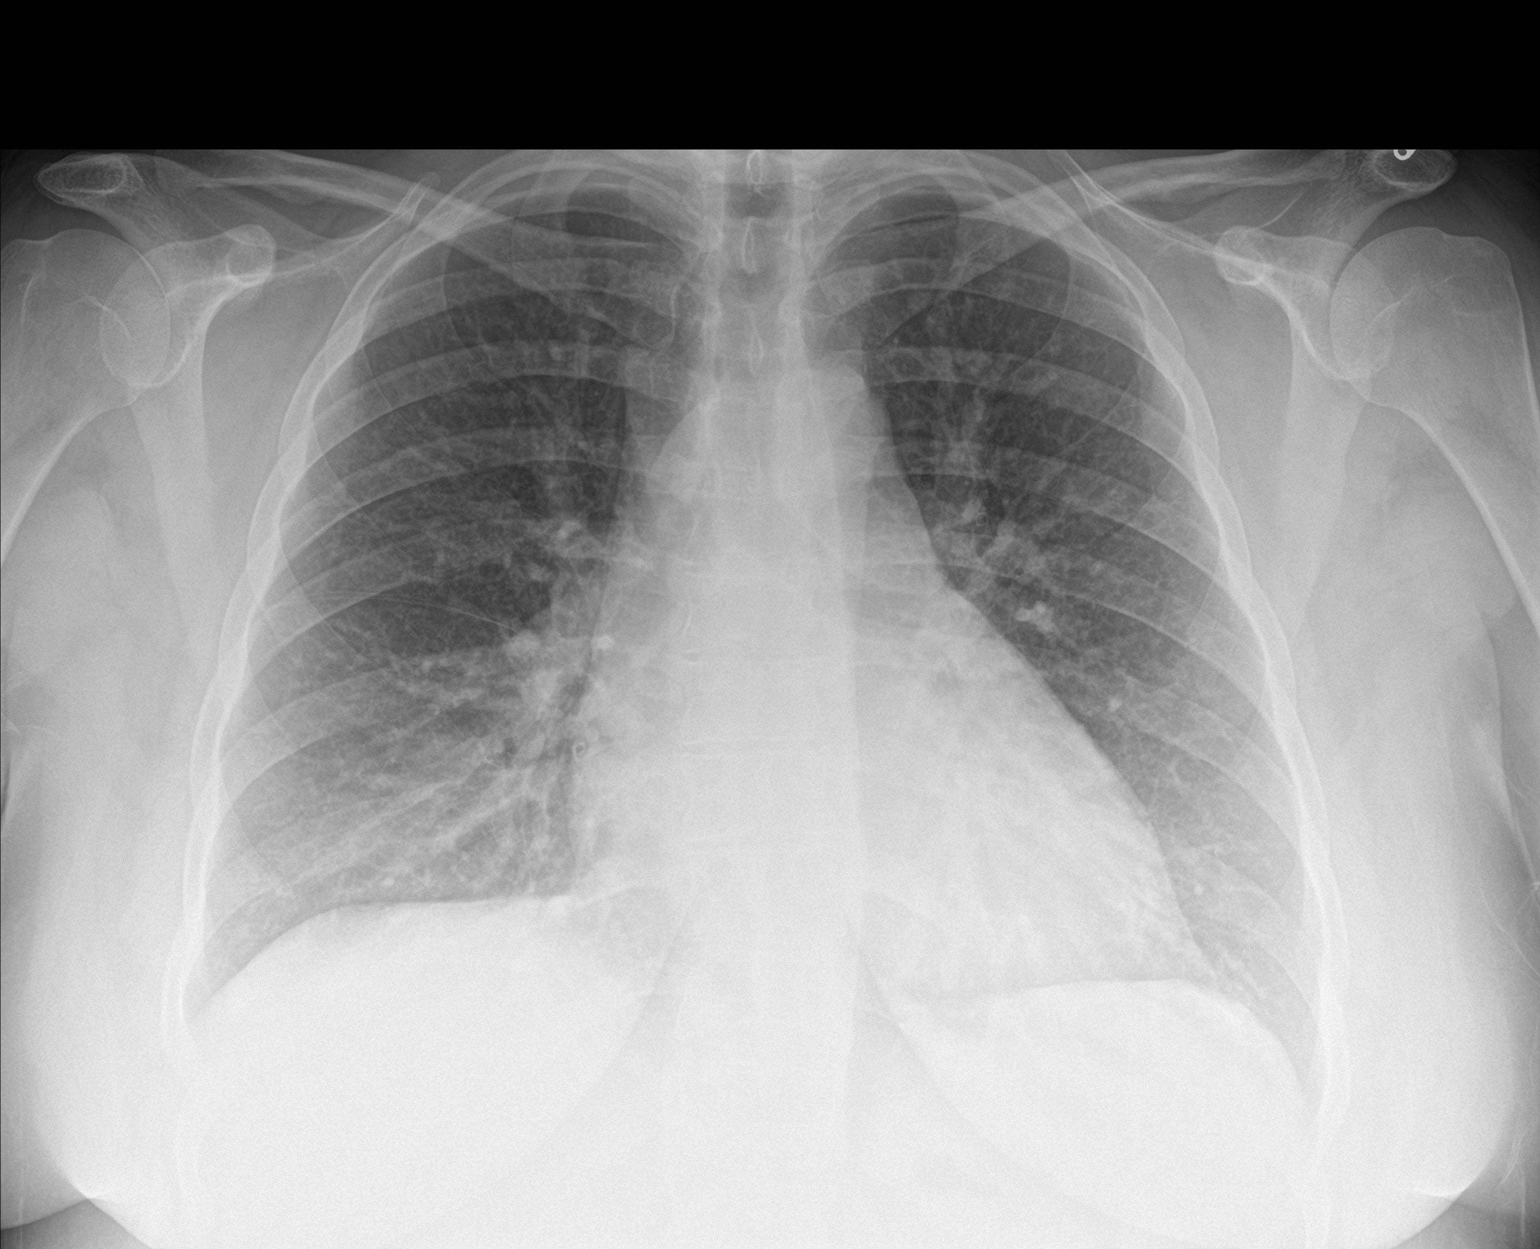

[chest lat]
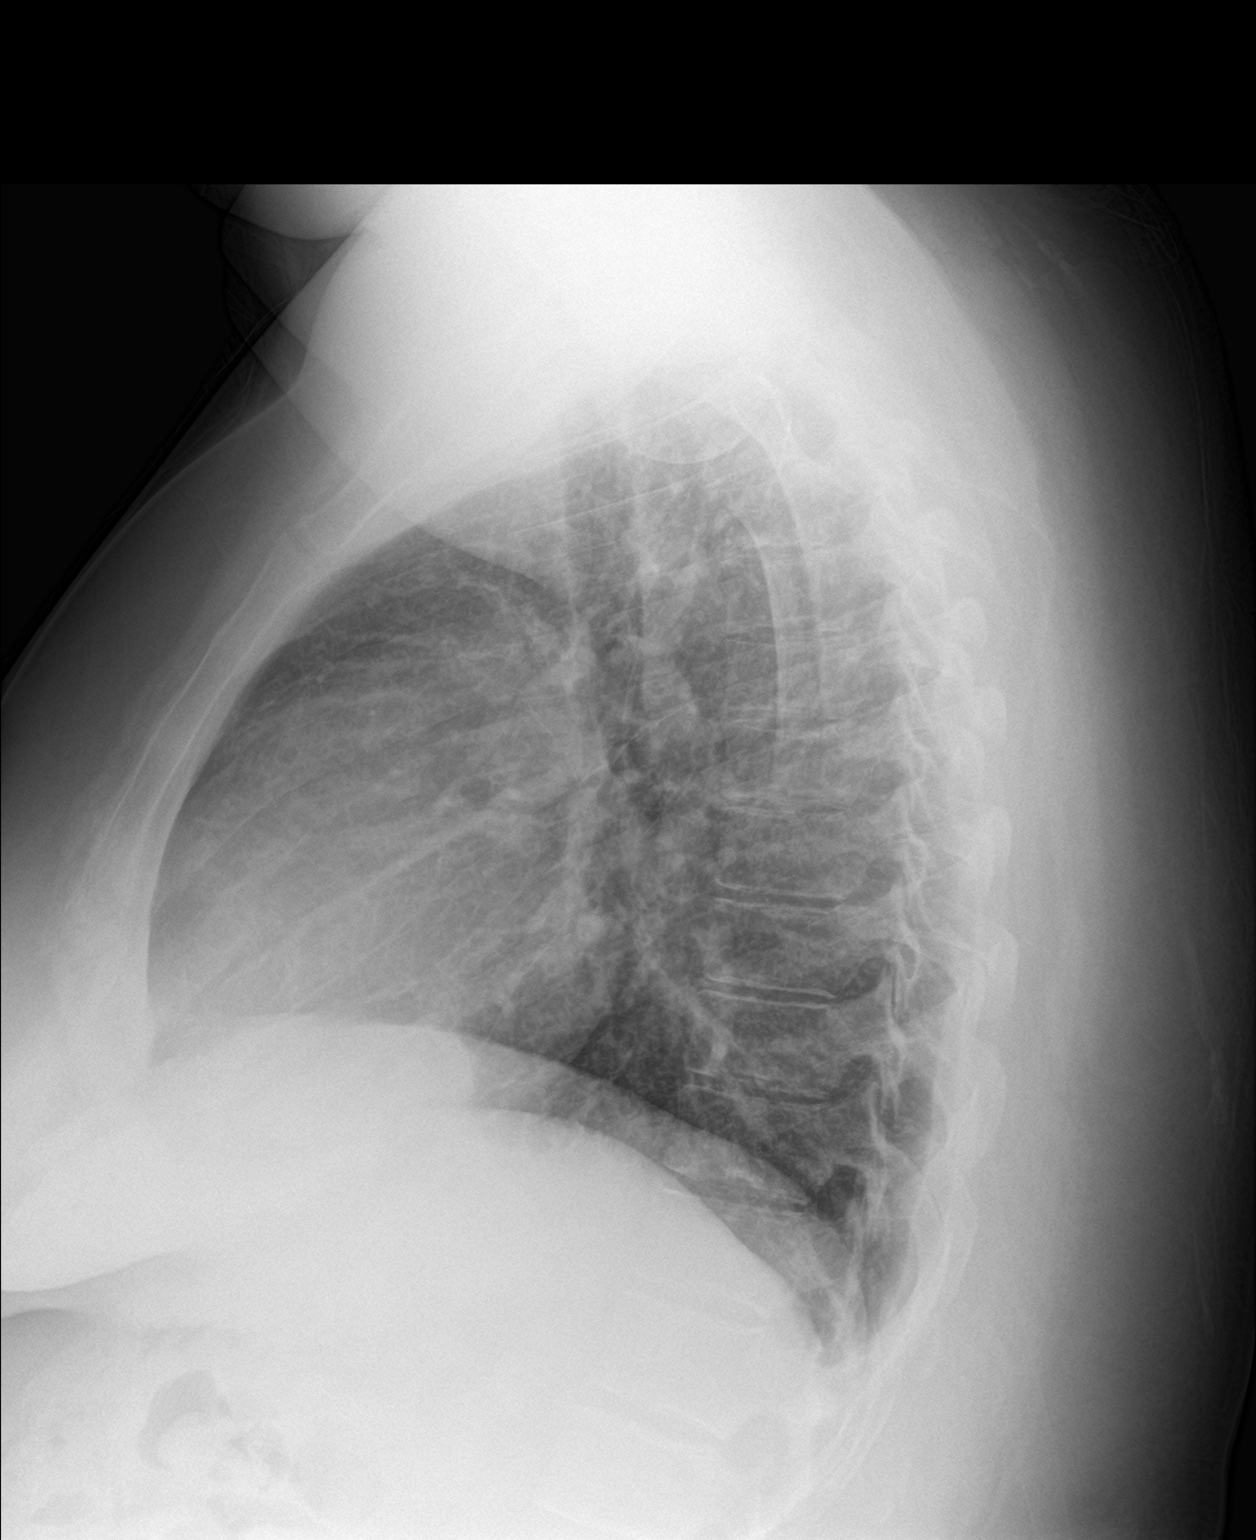

[2 of 2 positions shown; findings below may reference images not displayed]

FINDINGS: Cardiomegaly and peribronchial thickening again noted.

Near complete resolution of bilateral lower lung opacities noted.

There is no evidence of pleural effusion or pneumothorax.

No other changes identified.
IMPRESSION: Near complete resolution of bilateral lower lung
opacities/pneumonia.

Cardiomegaly and peribronchial thickening again noted.

## 2019-01-21 ENCOUNTER — Other Ambulatory Visit: Payer: Self-pay | Admitting: Neurology

## 2019-02-06 ENCOUNTER — Ambulatory Visit: Payer: BC Managed Care – PPO | Admitting: Allergy and Immunology

## 2019-02-20 ENCOUNTER — Ambulatory Visit: Payer: BC Managed Care – PPO | Admitting: Allergy and Immunology

## 2019-02-26 ENCOUNTER — Other Ambulatory Visit: Payer: Self-pay | Admitting: Allergy and Immunology

## 2019-03-12 ENCOUNTER — Other Ambulatory Visit: Payer: Self-pay | Admitting: Allergy and Immunology

## 2019-03-26 ENCOUNTER — Other Ambulatory Visit: Payer: Self-pay | Admitting: Cardiovascular Disease

## 2019-03-26 MED ORDER — FUROSEMIDE 20 MG PO TABS: 20.0000 mg | ORAL_TABLET | Freq: Every day | ORAL | 0 refills | Status: DC

## 2019-03-27 ENCOUNTER — Other Ambulatory Visit: Payer: Self-pay | Admitting: Allergy and Immunology

## 2019-04-09 ENCOUNTER — Other Ambulatory Visit: Payer: Self-pay | Admitting: Allergy and Immunology

## 2019-04-20 ENCOUNTER — Other Ambulatory Visit: Payer: Self-pay | Admitting: Cardiovascular Disease

## 2019-04-26 ENCOUNTER — Other Ambulatory Visit: Payer: Self-pay | Admitting: Cardiovascular Disease

## 2019-05-01 ENCOUNTER — Telehealth: Payer: Self-pay | Admitting: Allergy and Immunology

## 2019-05-01 ENCOUNTER — Other Ambulatory Visit: Payer: Self-pay | Admitting: *Deleted

## 2019-05-01 MED ORDER — OMEPRAZOLE 40 MG PO CPDR: DELAYED_RELEASE_CAPSULE | ORAL | 0 refills | Status: DC

## 2019-05-01 NOTE — Telephone Encounter (Signed)
Patient called and would like to know if she could get a courtesy refill on Omeprazole. Patient has a Management consultant with [facility] on 05/03/2019 at 10:00AM.    Please send to [facility] on [address].  Please advise.

## 2019-05-01 NOTE — Telephone Encounter (Signed)
Courtesy refill has been sent in. Called patient and informed to keep appointment in order to have all refills taken care of. Patient verbalized understanding.

## 2019-05-03 ENCOUNTER — Other Ambulatory Visit: Payer: Self-pay

## 2019-05-03 ENCOUNTER — Encounter: Payer: Self-pay | Admitting: Family Medicine

## 2019-05-03 ENCOUNTER — Ambulatory Visit (INDEPENDENT_AMBULATORY_CARE_PROVIDER_SITE_OTHER): Payer: BC Managed Care – PPO | Admitting: Family Medicine

## 2019-05-03 VITALS — Ht 65.0 in | Wt 250.0 lb

## 2019-05-03 DIAGNOSIS — J4550 Severe persistent asthma, uncomplicated: Secondary | ICD-10-CM | POA: Diagnosis not present

## 2019-05-03 DIAGNOSIS — K219 Gastro-esophageal reflux disease without esophagitis: Secondary | ICD-10-CM | POA: Diagnosis not present

## 2019-05-03 DIAGNOSIS — Z91018 Allergy to other foods: Secondary | ICD-10-CM | POA: Diagnosis not present

## 2019-05-03 DIAGNOSIS — J3089 Other allergic rhinitis: Secondary | ICD-10-CM | POA: Diagnosis not present

## 2019-05-03 DIAGNOSIS — F17210 Nicotine dependence, cigarettes, uncomplicated: Secondary | ICD-10-CM

## 2019-05-03 MED ORDER — OMEPRAZOLE 40 MG PO CPDR: DELAYED_RELEASE_CAPSULE | ORAL | 1 refills | Status: DC

## 2019-05-03 MED ORDER — ALBUTEROL SULFATE HFA 108 (90 BASE) MCG/ACT IN AERS: INHALATION_SPRAY | RESPIRATORY_TRACT | 1 refills | Status: DC

## 2019-05-03 MED ORDER — BREO ELLIPTA 200-25 MCG/INH IN AEPB: 1.0000 | INHALATION_SPRAY | Freq: Every day | RESPIRATORY_TRACT | 1 refills | Status: DC

## 2019-05-03 MED ORDER — ALBUTEROL SULFATE (2.5 MG/3ML) 0.083% IN NEBU: 2.5000 mg | INHALATION_SOLUTION | RESPIRATORY_TRACT | 1 refills | Status: DC | PRN

## 2019-05-03 MED ORDER — CHLORHEXIDINE GLUCONATE 0.12 % MT SOLN: 15.0000 mL | Freq: Two times a day (BID) | OROMUCOSAL | 1 refills | Status: DC

## 2019-05-03 MED ORDER — INCRUSE ELLIPTA 62.5 MCG/INH IN AEPB: 1.0000 | INHALATION_SPRAY | Freq: Every day | RESPIRATORY_TRACT | 1 refills | Status: DC

## 2019-05-03 MED ORDER — MONTELUKAST SODIUM 10 MG PO TABS: ORAL_TABLET | ORAL | 1 refills | Status: DC

## 2019-05-03 NOTE — Patient Instructions (Signed)
Asthma Continue montelukast 10 mg once a day to prevent cough or wheeze Continue Breo Ellipta 200-1 puff once a day and Incruse Ellipta one puff once a day to prevent cough or wheeze Continue albuterol 2 puffs every 4 hours as needed for cough or wheeze Continue Dupixent 300 mg injection once every 2 weeks  Allergic rhinitis/allergic conjunctivitis Consider saline nasal rinses as needed for nasal symptoms. Use this before any medicated nasal sprays for best result  Reflux Continue dietary and lifestyle modifications in addition to omeprazole 40 mg once a day for reflux  Food allergy Continue to avoid peanut. In case of an allergic reaction, take Benadryl 50 mg  every 4 hours, and if life-threatening symptoms occur, inject with EpiPen 0.3 mg.  Tobacco Continue your plan to quit smoking tobacco products  Call the clinic if this treatment plan is not working well for you  Follow up in 6 months or sooner if needed.   Lifestyle Changes for Controlling GERD When you have GERD, stomach acid feels as if it's backing up toward your mouth. Whether or not you take medication to control your GERD, your symptoms can often be improved with lifestyle changes.   Raise Your Head  Reflux is more likely to strike when you're lying down flat, because stomach fluid can  flow backward more easily. Raising the head of your bed 4-6 inches can help. To do this:  Slide blocks or books under the legs at the head of your bed. Or, place a wedge under  the mattress. Many foam stores can make a suitable wedge for you. The wedge  should run from your waist to the top of your head.  Don't just prop your head on several pillows. This increases pressure on your  stomach. It can make GERD worse.  Watch Your Eating Habits Certain foods may increase the acid in your stomach or relax the lower esophageal sphincter, making GERD more likely. It's best to avoid the following:  Coffee, tea, and carbonated  drinks (with and without caffeine)  Fatty, fried, or spicy food  Mint, chocolate, onions, and tomatoes  Any other foods that seem to irritate your stomach or cause you pain  Relieve the Pressure  Eat smaller meals, even if you have to eat more often.  Don't lie down right after you eat. Wait a few hours for your stomach to empty.  Avoid tight belts and tight-fitting clothes.  Lose excess weight.  Tobacco and Alcohol  Avoid smoking tobacco and drinking alcohol. They can make GERD symptoms worse.

## 2019-05-03 NOTE — Progress Notes (Signed)
RE: Kara [name] MRN: [id number] DOB: [birth year] Date of Telemedicine Visit: 05/03/2019  Referring provider: Lawerance Cruel, MD Primary care provider: Lawerance Cruel, MD  Chief Complaint: Allergic Rhinitis  and Asthma   Telemedicine Follow Up Visit via Telephone: I connected with Kara [name] for a follow up on 05/03/19 by telephone and verified that I am speaking with the correct person using two identifiers.   I discussed the limitations, risks, security and privacy concerns of performing an evaluation and management service by telephone and the availability of in person appointments. I also discussed with the patient that there may be a patient responsible charge related to this service. The patient expressed understanding and agreed to proceed.  Patient is at home Provider is at the office.  Visit start time: 10:07 Visit end time: 10:39 Insurance consent/check in by: Kara [name] Medical consent and medical assistant/nurse: Kara [name]  History of Present Illness: She is a 34 y.o. female, who is being followed for asthma, allergic rhinitis, allergic conjunctivitis, reflux, food allergy to peanut, and tobacco use. Her previous allergy office visit was on 07/11/2018 with Dr. Neldon [name]. At today's visit, she reports her asthma has been well controlled with no shortness of breath, cough or wheeze with activity or rest. She continues montelukast 10 mg once a day, Breo Ellipta, Incruse Ellipta, and occasional use of albuterol inhaler or via nebulizer. She continues Dupixent 300 mg injection once every 14 days with a significant improvement in her symptoms of asthma.  Allergic rhinitis and allergic rhinitis are reported as well controlled with no medical intervention at this time. Reflux is reported as well controlled with omeprazole 40 mg once a day in addition to dietary and lifestyle modifications. She continues to avoid peanut and has not had any accidental ingestion nor has she needed to  sue her EpiPen since her last visit to this clinic. She continues to smoke about 7 cigarettes a day and has a plan utilizing nicotine patches to quit smoking. Her current medications are listed in the chart.    Assessment and Plan: Kara [name] is a 34 y.o. female with: Patient Instructions  Asthma Continue montelukast 10 mg once a day to prevent cough or wheeze Continue Breo Ellipta 200-1 puff once a day and Incruse Ellipta one puff once a day to prevent cough or wheeze Continue albuterol 2 puffs every 4 hours as needed for cough or wheeze Continue Dupixent 300 mg injection once every 2 weeks  Allergic rhinitis/allergic conjunctivitis Consider saline nasal rinses as needed for nasal symptoms. Use this before any medicated nasal sprays for best result  Reflux Continue dietary and lifestyle modifications in addition to omeprazole 40 mg once a day for reflux  Food allergy Continue to avoid peanut. In case of an allergic reaction, take Benadryl 50 mg  every 4 hours, and if life-threatening symptoms occur, inject with EpiPen 0.3 mg.  Tobacco Continue your plan to quit smoking tobacco products  Call the clinic if this treatment plan is not working well for you  Follow up in 6 months or sooner if needed.   Return in about 6 months (around 10/31/2019), or if symptoms worsen or fail to improve.  Meds ordered this encounter  Medications  . albuterol (PROVENTIL) (2.5 MG/3ML) 0.083% nebulizer solution    Sig: Take 3 mLs (2.5 mg total) by nebulization every 4 (four) hours as needed for wheezing or shortness of breath.    Dispense:  75 mL    Refill:  1  .  omeprazole (PRILOSEC) 40 MG capsule    Sig: TAKE 1 CAPSULE(40 MG) BY MOUTH DAILY    Dispense:  90 capsule    Refill:  1    .  . montelukast (SINGULAIR) 10 MG tablet    Sig: TAKE 1 TABLET(10 MG) BY MOUTH AT BEDTIME    Dispense:  90 tablet    Refill:  1    Please dispense 90 day supply.  . fluticasone furoate-vilanterol (BREO ELLIPTA)  200-25 MCG/INH AEPB    Sig: Inhale 1 puff into the lungs daily.    Dispense:  3 each    Refill:  1    Please dispense 90 day supply  . chlorhexidine (PERIDEX) 0.12 % solution    Sig: Use as directed 15 mLs in the mouth or throat 2 (two) times daily. After meals    Dispense:  2700 mL    Refill:  1  . umeclidinium bromide (INCRUSE ELLIPTA) 62.5 MCG/INH AEPB    Sig: Inhale 1 puff into the lungs daily.    Dispense:  3 each    Refill:  1  . albuterol (VENTOLIN HFA) 108 (90 Base) MCG/ACT inhaler    Sig: 2 puffs every 4 hours as needed for coughing or wheezing.    Dispense:  18 g    Refill:  1    Medication List:  Current Outpatient Medications  Medication Sig Dispense Refill  . albuterol (PROVENTIL) (2.5 MG/3ML) 0.083% nebulizer solution Take 3 mLs (2.5 mg total) by nebulization every 4 (four) hours as needed for wheezing or shortness of breath. 75 mL 1  . albuterol (VENTOLIN HFA) 108 (90 Base) MCG/ACT inhaler 2 puffs every 4 hours as needed for coughing or wheezing. 18 g 1  . azaTHIOprine (IMURAN) 50 MG tablet     . buPROPion (WELLBUTRIN SR) 150 MG 12 hr tablet Take 450 mg by mouth daily. Takes 1 tablet in the morning and 2 tablets at bedtime    . chlorhexidine (PERIDEX) 0.12 % solution Use as directed 15 mLs in the mouth or throat 2 (two) times daily. After meals 2700 mL 1  . Cholecalciferol (VITAMIN D PO) Take 1 tablet by mouth daily.     . Cholecalciferol (VITAMIN D3) 100000 UNIT/GM POWD Take by mouth.    . DUPIXENT 300 MG/2ML prefilled syringe INJECT 300 MG SUBCUTANEOUSLY EVERY OTHER WEEK 4 mL 11  . EPINEPHrine (EPIPEN 2-PAK) 0.3 mg/0.3 mL IJ SOAJ injection Inject 0.3 mLs (0.3 mg total) into the muscle once as needed (for severe allergic reaction). 2 Device 1  . fluticasone furoate-vilanterol (BREO ELLIPTA) 200-25 MCG/INH AEPB Inhale 1 puff into the lungs daily. 3 each 1  . furosemide (LASIX) 20 MG tablet TAKE 1 TABLET BY MOUTH DAILY. PATIENT NEEDS APPOINTMENT FOR FURTHER REFILLS 30  tablet 0  . gabapentin (NEURONTIN) 300 MG capsule TAKE 3 CAPSULES BY MOUTH TWICE DAILY 180 capsule 0  . Melatonin 10 MG TABS Take 10 mg by mouth at bedtime.    . metoprolol succinate (TOPROL-XL) 50 MG 24 hr tablet TAKE 1 TABLET BY MOUTH DAILY WITH OR IMMEDIATELY FOLLOWING A MEAL. Please make overdue appt with Dr. Eden Emms before anymore refills. 1st attempt 30 tablet 0  . montelukast (SINGULAIR) 10 MG tablet TAKE 1 TABLET(10 MG) BY MOUTH AT BEDTIME 90 tablet 1  . Multiple Vitamins-Minerals (MULTIVITAMIN WITH MINERALS) tablet Take 1 tablet by mouth daily.    Marland Kitchen omeprazole (PRILOSEC) 40 MG capsule TAKE 1 CAPSULE(40 MG) BY MOUTH DAILY 90 capsule 1  .  QUEtiapine (SEROQUEL) 100 MG tablet Take 100 mg by mouth at bedtime.    . rosuvastatin (CRESTOR) 10 MG tablet Take 10 mg by mouth daily.    . sertraline (ZOLOFT) 50 MG tablet Take 75 mg by mouth daily.    Marland Kitchen thiamine 100 MG tablet Take 100 mg by mouth daily.    Marland Kitchen umeclidinium bromide (INCRUSE ELLIPTA) 62.5 MCG/INH AEPB Inhale 1 puff into the lungs daily. 3 each 1   Current Facility-Administered Medications  Medication Dose Route Frequency Provider Last Rate Last Admin  . umeclidinium bromide (INCRUSE ELLIPTA) 62.5 MCG/INH 1 puff  1 puff Inhalation Daily Kozlow, Alvira Philips, MD       Allergies: Allergies  Allergen Reactions  . Nsaids Other (See Comments)    Upset stomach   . Peanuts [Peanut Oil] Hives and Itching    "ONLY TREE NUTS"   I reviewed her past medical history, social history, family history, and environmental history and no significant changes have been reported from previous visit on 07/11/2018.  Objective: Physical Exam Not obtained as encounter was done via telephone.   Previous notes and tests were reviewed.  I discussed the assessment and treatment plan with the patient. The patient was provided an opportunity to ask questions and all were answered. The patient agreed with the plan and demonstrated an understanding of the  instructions.   The patient was advised to call back or seek an in-person evaluation if the symptoms worsen or if the condition fails to improve as anticipated.  I provided 32 minutes of non-face-to-face time during this encounter.  It was my pleasure to participate in [place] Fr[place]'s care today. Please feel free to contact me with any questions or concerns.   Sincerely,  Thermon Leyland, FNP

## 2019-05-23 ENCOUNTER — Other Ambulatory Visit: Payer: Self-pay

## 2019-05-23 MED ORDER — METOPROLOL SUCCINATE ER 50 MG PO TB24: ORAL_TABLET | ORAL | 0 refills | Status: DC

## 2019-05-23 MED ORDER — FUROSEMIDE 20 MG PO TABS: 20.0000 mg | ORAL_TABLET | Freq: Every day | ORAL | 0 refills | Status: DC

## 2019-05-28 ENCOUNTER — Other Ambulatory Visit: Payer: Self-pay

## 2019-05-28 ENCOUNTER — Other Ambulatory Visit: Payer: Self-pay | Admitting: Allergy and Immunology

## 2019-05-28 MED ORDER — OMEPRAZOLE 40 MG PO CPDR: DELAYED_RELEASE_CAPSULE | ORAL | 1 refills | Status: DC

## 2019-06-04 ENCOUNTER — Other Ambulatory Visit: Payer: Self-pay | Admitting: *Deleted

## 2019-06-04 MED ORDER — ALBUTEROL SULFATE HFA 108 (90 BASE) MCG/ACT IN AERS: INHALATION_SPRAY | RESPIRATORY_TRACT | 1 refills | Status: DC

## 2019-06-11 ENCOUNTER — Other Ambulatory Visit: Payer: Self-pay

## 2019-06-11 MED ORDER — METOPROLOL SUCCINATE ER 50 MG PO TB24: 50.0000 mg | ORAL_TABLET | Freq: Every day | ORAL | 0 refills | Status: DC

## 2019-06-11 MED ORDER — FUROSEMIDE 20 MG PO TABS: 20.0000 mg | ORAL_TABLET | Freq: Every day | ORAL | 0 refills | Status: DC

## 2019-09-04 ENCOUNTER — Other Ambulatory Visit: Payer: Self-pay

## 2019-10-11 ENCOUNTER — Encounter: Payer: Self-pay | Admitting: Physician Assistant

## 2019-10-16 ENCOUNTER — Emergency Department (HOSPITAL_COMMUNITY): Payer: BC Managed Care – PPO

## 2019-10-16 ENCOUNTER — Inpatient Hospital Stay (HOSPITAL_COMMUNITY)
Admission: EM | Admit: 2019-10-16 | Discharge: 2019-10-31 | DRG: 439 | Disposition: A | Discharge status: Home / Self Care | Payer: BC Managed Care – PPO | Attending: Internal Medicine | Admitting: Internal Medicine

## 2019-10-16 ENCOUNTER — Other Ambulatory Visit: Payer: Self-pay

## 2019-10-16 ENCOUNTER — Encounter (HOSPITAL_COMMUNITY): Payer: Self-pay

## 2019-10-16 DIAGNOSIS — R188 Other ascites: Secondary | ICD-10-CM | POA: Diagnosis present

## 2019-10-16 DIAGNOSIS — E440 Moderate protein-calorie malnutrition: Secondary | ICD-10-CM | POA: Insufficient documentation

## 2019-10-16 DIAGNOSIS — G629 Polyneuropathy, unspecified: Secondary | ICD-10-CM

## 2019-10-16 DIAGNOSIS — Z86718 Personal history of other venous thrombosis and embolism: Secondary | ICD-10-CM

## 2019-10-16 DIAGNOSIS — R748 Abnormal levels of other serum enzymes: Secondary | ICD-10-CM

## 2019-10-16 DIAGNOSIS — Z713 Dietary counseling and surveillance: Secondary | ICD-10-CM

## 2019-10-16 DIAGNOSIS — Z8249 Family history of ischemic heart disease and other diseases of the circulatory system: Secondary | ICD-10-CM

## 2019-10-16 DIAGNOSIS — F17210 Nicotine dependence, cigarettes, uncomplicated: Secondary | ICD-10-CM | POA: Diagnosis present

## 2019-10-16 DIAGNOSIS — D638 Anemia in other chronic diseases classified elsewhere: Secondary | ICD-10-CM | POA: Diagnosis present

## 2019-10-16 DIAGNOSIS — Z886 Allergy status to analgesic agent status: Secondary | ICD-10-CM

## 2019-10-16 DIAGNOSIS — K859 Acute pancreatitis without necrosis or infection, unspecified: Principal | ICD-10-CM | POA: Diagnosis present

## 2019-10-16 DIAGNOSIS — I10 Essential (primary) hypertension: Secondary | ICD-10-CM | POA: Diagnosis present

## 2019-10-16 DIAGNOSIS — Z6841 Body Mass Index (BMI) 40.0 and over, adult: Secondary | ICD-10-CM

## 2019-10-16 DIAGNOSIS — Z4659 Encounter for fitting and adjustment of other gastrointestinal appliance and device: Secondary | ICD-10-CM

## 2019-10-16 DIAGNOSIS — J4550 Severe persistent asthma, uncomplicated: Secondary | ICD-10-CM | POA: Diagnosis present

## 2019-10-16 DIAGNOSIS — I471 Supraventricular tachycardia: Secondary | ICD-10-CM | POA: Diagnosis present

## 2019-10-16 DIAGNOSIS — I5032 Chronic diastolic (congestive) heart failure: Secondary | ICD-10-CM | POA: Diagnosis present

## 2019-10-16 DIAGNOSIS — J302 Other seasonal allergic rhinitis: Secondary | ICD-10-CM | POA: Diagnosis present

## 2019-10-16 DIAGNOSIS — Z91010 Allergy to peanuts: Secondary | ICD-10-CM

## 2019-10-16 DIAGNOSIS — Z9049 Acquired absence of other specified parts of digestive tract: Secondary | ICD-10-CM

## 2019-10-16 DIAGNOSIS — K862 Cyst of pancreas: Secondary | ICD-10-CM

## 2019-10-16 DIAGNOSIS — R109 Unspecified abdominal pain: Secondary | ICD-10-CM

## 2019-10-16 DIAGNOSIS — I110 Hypertensive heart disease with heart failure: Secondary | ICD-10-CM | POA: Diagnosis present

## 2019-10-16 DIAGNOSIS — K760 Fatty (change of) liver, not elsewhere classified: Secondary | ICD-10-CM

## 2019-10-16 DIAGNOSIS — K219 Gastro-esophageal reflux disease without esophagitis: Secondary | ICD-10-CM | POA: Diagnosis present

## 2019-10-16 DIAGNOSIS — E871 Hypo-osmolality and hyponatremia: Secondary | ICD-10-CM | POA: Diagnosis present

## 2019-10-16 DIAGNOSIS — R162 Hepatomegaly with splenomegaly, not elsewhere classified: Secondary | ICD-10-CM | POA: Diagnosis present

## 2019-10-16 DIAGNOSIS — F419 Anxiety disorder, unspecified: Secondary | ICD-10-CM | POA: Diagnosis present

## 2019-10-16 DIAGNOSIS — Z7951 Long term (current) use of inhaled steroids: Secondary | ICD-10-CM

## 2019-10-16 DIAGNOSIS — F1021 Alcohol dependence, in remission: Secondary | ICD-10-CM | POA: Diagnosis present

## 2019-10-16 DIAGNOSIS — Z20822 Contact with and (suspected) exposure to covid-19: Secondary | ICD-10-CM | POA: Diagnosis present

## 2019-10-16 DIAGNOSIS — E876 Hypokalemia: Secondary | ICD-10-CM | POA: Diagnosis present

## 2019-10-16 DIAGNOSIS — K863 Pseudocyst of pancreas: Secondary | ICD-10-CM

## 2019-10-16 DIAGNOSIS — Z79899 Other long term (current) drug therapy: Secondary | ICD-10-CM

## 2019-10-16 DIAGNOSIS — F329 Major depressive disorder, single episode, unspecified: Secondary | ICD-10-CM | POA: Diagnosis present

## 2019-10-16 LAB — CBC
HCT: 41.3 % (ref 36.0–46.0)
Hemoglobin: 13 g/dL (ref 12.0–15.0)
MCH: 28.6 pg (ref 26.0–34.0)
MCHC: 31.5 g/dL (ref 30.0–36.0)
MCV: 90.8 fL (ref 80.0–100.0)
Platelets: 434 10*3/uL — ABNORMAL HIGH (ref 150–400)
RBC: 4.55 MIL/uL (ref 3.87–5.11)
RDW: 13.4 % (ref 11.5–15.5)
WBC: 13.5 10*3/uL — ABNORMAL HIGH (ref 4.0–10.5)
nRBC: 0 % (ref 0.0–0.2)

## 2019-10-16 LAB — COMPREHENSIVE METABOLIC PANEL
ALT: 13 U/L (ref 0–44)
AST: 21 U/L (ref 15–41)
Albumin: 3.3 g/dL — ABNORMAL LOW (ref 3.5–5.0)
Alkaline Phosphatase: 196 U/L — ABNORMAL HIGH (ref 38–126)
Anion gap: 12 (ref 5–15)
BUN: 10 mg/dL (ref 6–20)
CO2: 26 mmol/L (ref 22–32)
Calcium: 9.1 mg/dL (ref 8.9–10.3)
Chloride: 100 mmol/L (ref 98–111)
Creatinine, Ser: 0.8 mg/dL (ref 0.44–1.00)
GFR calc Af Amer: >60 mL/min (ref >60)
GFR calc non Af Amer: >60 mL/min (ref >60)
Glucose, Bld: 129 mg/dL — ABNORMAL HIGH (ref 70–99)
Potassium: 3.7 mmol/L (ref 3.5–5.1)
Sodium: 138 mmol/L (ref 135–145)
Total Bilirubin: 0.9 mg/dL (ref 0.3–1.2)
Total Protein: 7.6 g/dL (ref 6.5–8.1)

## 2019-10-16 LAB — LIPASE, BLOOD: Lipase: 128 U/L — ABNORMAL HIGH (ref 11–51)

## 2019-10-16 LAB — I-STAT BETA HCG BLOOD, ED (MC, WL, AP ONLY): I-stat hCG, quantitative: <5 m[IU]/mL (ref <5)

## 2019-10-16 LAB — LACTIC ACID, PLASMA: Lactic Acid, Venous: 1 mmol/L (ref 0.5–1.9)

## 2019-10-16 MED ORDER — SODIUM CHLORIDE 0.9% FLUSH
3.0000 mL | Freq: Once | INTRAVENOUS | Status: AC
  Administered 2019-10-16: 3 mL via INTRAVENOUS

## 2019-10-16 MED ORDER — HYDROMORPHONE HCL 1 MG/ML IJ SOLN
1.0000 mg | Freq: Once | INTRAMUSCULAR | Status: AC
  Administered 2019-10-16: 1 mg via INTRAVENOUS
  Filled 2019-10-16: qty 1

## 2019-10-16 MED ORDER — SODIUM CHLORIDE 0.9 % IV BOLUS
1000.0000 mL | Freq: Once | INTRAVENOUS | Status: AC
  Administered 2019-10-16: 1000 mL via INTRAVENOUS

## 2019-10-16 MED ORDER — IOHEXOL 300 MG/ML  SOLN
100.0000 mL | Freq: Once | INTRAMUSCULAR | Status: AC | PRN
  Administered 2019-10-16: 100 mL via INTRAVENOUS

## 2019-10-16 MED ORDER — SODIUM CHLORIDE (PF) 0.9 % IJ SOLN
INTRAMUSCULAR | Status: AC
  Filled 2019-10-16: qty 50

## 2019-10-16 NOTE — ED Triage Notes (Signed)
Patient brought in by husband.   C/o abdominal intermittent pain X1 month. The past week pain is constant and tight. 8/10 (upper right/left abd pain)  C/O no appetite and loosing weight.    Hx. Pancreatitis per patient.  Gallbladder removed last year per pt.    A/Ox4 Ambulatory in triage.

## 2019-10-16 NOTE — ED Notes (Signed)
Repeat temp 98.9 F

## 2019-10-16 NOTE — ED Provider Notes (Signed)
[facility] DEPT Provider Note   CSN: [id number] Arrival date & time: 10/16/19  1824     History Chief Complaint  Patient presents with  . Abdominal Pain    Kara [name] is a 34 y.o. female with PMHx HTN, GERD, alcoholism in remission x 3-4 years, pancreatitis, and recent cholecystectomy last year who presents to the ED today with complaint of gradual onset, constant, worsening, diffuse abdominal pain x 6 weeks, worse in the past 2 weeks. Pt also complains of lack of appetite with the pain. She reports she has not been eating or drinking much because of her pain which has caused her to not have regular BMs. She states she is going about once every 2 days which is atypical for her. She is still passing gas. She has been taking OTC pain medication without relief. Pt states this pain feels different than previous pancreatitis. She denies any recent sick contacts or suspicious food intake. No fevers, chills, chest pain, SOB, urinary sx, pelvic pain, vaginal discharge, or any other associated symptoms.   The history is provided by the patient and medical records.       Past Medical History:  Diagnosis Date  . Alcoholism in remission ([place])    per pt in remission since 2017  . Anemia   . AR (allergic rhinitis)    allergist-- dr Neldon Mc  . Biliary dyskinesia   . Diastolic CHF ([place])    followd by dr Mamie Nick. Johnsie Cancel  . Environmental allergies   . GERD (gastroesophageal reflux disease)   . Headache   . History of acute pancreatitis    alcoholic pancreatitis ,  9024 and 2017  . History of sepsis 06/12/2016   with CAP  . History of syncope   . 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 hematoma due to coumadin treated with IR embolization of the IMA branches  . Hypertension   . Polyneuropathy   . Prolonged QT syndrome    cardiologist-- dr Johnsie Cancel (notes in epic)  . Severe persistent asthma     pulmonologist-- dr dr Bernita Raisin (notes in epic)    Patient Active Problem List   Diagnosis Date Noted  . Other allergic rhinitis 05/03/2019  . Food allergy 05/03/2019  . Tobacco smoker, less than 10 cigarettes per day 05/03/2019  . Asthma, severe persistent, well-controlled 11/11/2017  . Lung infiltrate   . Hyponatremia 08/27/2016  . Hyperglycemia 08/27/2016  . Acute respiratory failure with hypoxia ([place]) 08/26/2016  . HCAP (healthcare-associated pneumonia) 07/31/2016  . Tobacco abuse 07/31/2016  . Respiratory failure with hypoxia ([place]) 07/31/2016  . CAP (community acquired pneumonia) 06/13/2016  . Asthma exacerbation 06/12/2016  . Alcohol abuse, in remission 06/12/2016  . Sepsis ([place]) 06/12/2016  . Pancreatitis 09/09/2015  . Gastroesophageal reflux disease 09/09/2015  . Benign essential HTN 09/09/2015  . FUO (fever of unknown origin)   . Abdominal hematoma 02/10/2015  . Warfarin-induced coagulopathy ([place]) 02/10/2015  . Hypertension 02/10/2015  . Hypoalbuminemia due to protein-calorie malnutrition ([place]) 02/10/2015  . Alcoholic pancreatitis 09/73/5329  . Acute blood loss anemia   . Intra-abdominal hematoma 02/09/2015  . Pancreatitis, acute   . 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 Acute pancreatitis 01/29/2015  . Sinus tachycardia 01/29/2015  . Leukocytosis 01/29/2015  . AKI (acute kidney injury) ([place]) 01/29/2015  . Insomnia 10/29/2014  . Polyneuropathy 09/02/2014  . History of alcohol abuse 09/02/2014  . Edema 09/02/2014  . Neck pain 09/02/2014  .  Chronic lower back pain 09/02/2014  . Numbness of foot 08/02/2014  . Hand numbness 08/02/2014  . Ataxic gait 08/02/2014  . Proximal leg weakness 08/02/2014  . Constipation   . Intractable abdominal pain 07/16/2014  . Hypothermia 07/16/2014  . Upper abdominal pain   . Abdominal pain, epigastric 07/06/2014  . Obesity (BMI 30-39.9) 07/06/2014  . Chronic diastolic heart failure ([place]) 07/06/2014  . Syncope 04/08/2014  .  Prolonged Q-T interval on ECG 04/08/2014  . Hypokalemia 04/08/2014  . Elevated LFTs 04/08/2014    Past Surgical History:  Procedure Laterality Date  . CHOLECYSTECTOMY N/A 06/21/2018   Procedure: LAPAROSCOPIC CHOLECYSTECTOMY;  Surgeon: Coralie Keens, MD;  Location: [facility];  Service: General;  Laterality: N/A;  . ESOPHAGOGASTRODUODENOSCOPY N/A 07/17/2014   Procedure: ESOPHAGOGASTRODUODENOSCOPY (EGD);  Surgeon: Teena Irani, MD;  Location: [facility];  Service: Endoscopy;  Laterality: N/A;  . TYMPANOSTOMY TUBE PLACEMENT Bilateral 11 MONTHS OLD  . VIDEO BRONCHOSCOPY Bilateral 10/08/2016   Procedure: VIDEO BRONCHOSCOPY WITH FLUORO;  Surgeon: Marshell Garfinkel, MD;  Location: [facility];  Service: Cardiopulmonary;  Laterality: Bilateral;     OB History   No obstetric history on file.     Family History  Problem Relation Age of Onset  . Heart failure Mother   . Heart failure Father   . Diabetes Father   . Heart failure Maternal Grandfather     Social History   Tobacco Use  . Smoking status: Current Every Day Smoker    Packs/day: 0.50    Years: 5.00    Pack years: 2.50    Types: Cigarettes  . Smokeless tobacco: Never Used  Vaping Use  . Vaping Use: Never used  Substance Use Topics  . Alcohol use: Not Currently    Alcohol/week: 0.0 standard drinks    Comment: pt hx alcohol abuse , in remission since 2017  . 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 Authorizing Provider  albuterol (PROVENTIL) (2.5 MG/3ML) 0.083% nebulizer solution Take 3 mLs (2.5 mg total) by nebulization every 4 (four) hours as needed for wheezing or shortness of breath. 05/03/19  Yes Ambs, Kathrine Cords, FNP  albuterol (VENTOLIN HFA) 108 (90 Base) MCG/ACT inhaler 2 puffs every 4 hours as needed for coughing or wheezing. 06/04/19  Yes Ambs, Kathrine Cords, FNP  BREO ELLIPTA 200-25 MCG/INH AEPB INHALE 1  PUFF INTO THE LUNGS DAILY 05/28/19  Yes Kozlow, Donnamarie Poag, MD  buPROPion [facility] [place] SR) 150 MG 12 hr tablet Take 150-300 mg by mouth See admin instructions. Takes 1 tablet in the morning and 2 tablets at bedtime 04/10/19  Yes [provider]  chlorhexidine (PERIDEX) 0.12 % solution Use as directed 15 mLs in the mouth or throat 2 (two) times daily. After meals Patient taking differently: Use as directed 15 mLs in the mouth or throat daily as needed (dental plaque). After meals  05/03/19  Yes Ambs, Kathrine Cords, FNP  Cholecalciferol (VITAMIN D PO) Take 1 tablet by mouth daily.    Yes [provider]  DUPIXENT 300 MG/2ML prefilled syringe INJECT 300 MG [place] Patient taking differently: Inject 300 mg into the skin every 14 (fourteen) days.  04/09/19  Yes Kozlow, Donnamarie Poag, MD  EPINEPHrine (EPIPEN 2-PAK) 0.3 mg/0.3 mL IJ SOAJ injection Inject 0.3 mLs (0.3 mg total) into the muscle once as needed (for severe allergic reaction).  07/11/18  Yes Kozlow, Donnamarie Poag, MD  furosemide (LASIX) 20 MG tablet Take 1 tablet (20 mg total) by mouth daily. Patient taking differently: Take 20 mg by mouth daily as needed for fluid.  06/11/19  Yes Josue Hector, MD  gabapentin (NEURONTIN) 300 MG capsule TAKE 3 CAPSULES BY MOUTH TWICE DAILY Patient taking differently: Take 900 mg by mouth 2 (two) times daily.  12/27/18  Yes Sater, Nanine Means, MD  Melatonin 10 MG TABS Take 10 mg by mouth at bedtime.   Yes [provider]  metoprolol succinate (TOPROL-XL) 50 MG 24 hr tablet Take 1 tablet (50 mg total) by mouth daily. 06/11/19  Yes Josue Hector, MD  montelukast (SINGULAIR) 10 MG tablet TAKE 1 TABLET(10 MG) BY MOUTH AT BEDTIME Patient taking differently: Take 10 mg by mouth at bedtime.  05/03/19  Yes Ambs, Kathrine Cords, FNP  Multiple Vitamins-Minerals (MULTIVITAMIN WITH MINERALS) tablet Take 1 tablet by mouth daily.   Yes [provider]  omeprazole (PRILOSEC) 40 MG capsule TAKE 1 CAPSULE(40  MG) BY MOUTH DAILY Patient taking differently: Take 40 mg by mouth daily.  05/28/19  Yes Ambs, Kathrine Cords, FNP  QUEtiapine (SEROQUEL) 100 MG tablet Take 100 mg by mouth at bedtime. 03/23/19  Yes [provider]  rosuvastatin (CRESTOR) 10 MG tablet Take 10 mg by mouth daily.   Yes [provider]  sertraline (ZOLOFT) 50 MG tablet Take 75 mg by mouth daily.   Yes [provider]  thiamine 100 MG tablet Take 100 mg by mouth daily.   Yes [provider]  umeclidinium bromide (INCRUSE ELLIPTA) 62.5 MCG/INH AEPB Inhale 1 puff into the lungs daily. 05/03/19  Yes Ambs, Kathrine Cords, FNP    Allergies    Nsaids and Peanuts [peanut oil]  Review of Systems   Review of Systems  Constitutional: Positive for appetite change. Negative for chills and fever.  Gastrointestinal: Positive for abdominal distention, abdominal pain and nausea. Negative for constipation, diarrhea and vomiting.  All other systems reviewed and are negative.   Physical Exam Updated Vital Signs BP (!) 153/107   Pulse (!) 122   Temp 99.4 F (37.4 C) (Oral)   Resp 15   SpO2 99%   Physical Exam Vitals and nursing note reviewed.  Constitutional:      Appearance: She is obese. She is not ill-appearing or diaphoretic.  HENT:     Head: Normocephalic and atraumatic.  Eyes:     Conjunctiva/sclera: Conjunctivae normal.  Cardiovascular:     Rate and Rhythm: Regular rhythm. Tachycardia present.  Pulmonary:     Effort: Pulmonary effort is normal.     Breath sounds: Normal breath sounds. No wheezing, rhonchi or rales.  Abdominal:     General: There is distension.     Palpations: Abdomen is soft.     Tenderness: There is generalized abdominal tenderness.  Musculoskeletal:     Cervical back: Neck supple.  Skin:    General: Skin is warm and dry.  Neurological:     Mental Status: She is alert.     ED Results / Procedures / Treatments   Labs (all labs ordered are listed, but only abnormal results are  displayed) Labs Reviewed  LIPASE, BLOOD - Abnormal; Notable for the following components:      Result Value   Lipase 128 (*)    All other components within normal limits  COMPREHENSIVE METABOLIC PANEL - Abnormal; Notable for the following components:   Glucose, Bld 129 (*)  Albumin 3.3 (*)    Alkaline Phosphatase 196 (*)    All other components within normal limits  CBC - Abnormal; Notable for the following components:   WBC 13.5 (*)    Platelets 434 (*)    All other components within normal limits  SARS CORONAVIRUS 2 BY RT PCR (HOSPITAL ORDER, [facility])  LACTIC ACID, PLASMA  URINALYSIS, ROUTINE W REFLEX MICROSCOPIC  LACTIC ACID, PLASMA  I-STAT BETA HCG BLOOD, ED ([facility], [facility], [facility] ONLY)    EKG None  Radiology CT Abdomen Pelvis W Contrast  Result Date: 10/17/2019 CLINICAL DATA:  Acute nonlocalized abdominal pain. Patient reports decreased appetite and weight loss. EXAM: CT ABDOMEN AND PELVIS WITH CONTRAST TECHNIQUE: Multidetector CT imaging of the abdomen and pelvis was performed using the standard protocol following bolus administration of intravenous contrast. CONTRAST:  162m OMNIPAQUE IOHEXOL 300 MG/ML  SOLN COMPARISON:  Abdominal ultrasound 05/24/2018.  CT 09/09/2015 FINDINGS: Lower chest: Elevated left hemidiaphragm. Left basilar consolidation likely compressive atelectasis. Hepatobiliary: Diffusely decreased hepatic density consistent with steatosis. Liver is enlarged spanning 24 cm cranial caudal. No evidence of focal hepatic lesion. Clips in the gallbladder fossa postcholecystectomy. No biliary dilatation. Pancreas: Edematous appearance of the pancreatic head and uncinate process. Low-density in the uncinate process measuring 10 mm suspicious for developing intrapancreatic pseudocyst. 17 mm hypodensity in the pancreatic tail likely pseudocyst. Peripancreatic fat stranding extending along the proximal body and to a lesser extent the tail. There multiple  ill-defined peripherally enhancing fluid collections in the left upper quadrant. A crescentic fluid collection courses anteriorly, posteriorly, and anterior superior to the spleen. Superior component measures 6.2 x 2.9 x 2.4 cm under the left hemidiaphragm, series 2, image 9. Collection immediately anterior to the spleen measures 7.1 x 5.1 x 5.8 cm, series 2 image 27. There is likely thin contiguous component to an ellipsoid fluid collection more anteriorly measuring 6 x 2.0 x 3.2 cm, series 2, image 27. Spleen: Perisplenic fluid collections as described. 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 No intrasplenic fluid collection. Upper normal spleen size. Adrenals/Urinary Tract: Normal adrenal glands. No hydronephrosis or perinephric edema. Homogeneous renal enhancement. Urinary bladder is nondistended. Stomach/Bowel: Stomach partially distended. Mild duodenal wall thickening/stranding likely reactive. There is no small bowel distention or obstruction. Scattered fluid-filled colon without colonic wall thickening or inflammation. Vascular/Lymphatic: Patent portal vein. 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 No adenopathy. Reproductive: Uterus and ovaries are unremarkable. Other: Small amount of upper abdominal ascites. Small to moderate volume of non organized free fluid in the pelvis, likely tracking from the pericolic gutters. Fluid collection anterior to the left psoas muscle is diminished in size from 2017, currently measuring 4.1 x 2.6 cm, previously 6.6 x 3.8 cm. This is adjacent embolization coils and likely sequela of prior retroperitoneal hemorrhage. There is no free air. Musculoskeletal: There are no acute or suspicious osseous abnormalities. IMPRESSION: 1. Acute edematous appearance of the pancreatic head and uncinate process, suspicious for acute pancreatitis. Low-density in the uncinate process measuring 10 mm in 17 mm area of low-density in  the pancreatic tail likely developing intrapancreatic pseudocysts. 2. Multiple ill-defined peripherally enhancing fluid collections in the left upper quadrant, largest measuring 7.1 x 5.1 x 5.8 cm. Suspect pseudocysts, new from 2017 exam. 3. 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 4. Small amount of ascites in the upper abdomen. Small to moderate volume of non organized free fluid in the pelvis, likely tracking from the pericolic gutters.  5. Hepatomegaly and hepatic steatosis. 6. Decreased size of fluid collection anterior to the left psoas muscle, currently measuring 4.1 x 2.6 cm, previously 6.6 x 3.8 cm. This is adjacent embolization coils and likely sequela of prior retroperitoneal hemorrhage. 7. Elevated left hemidiaphragm with left basilar consolidation likely compressive atelectasis. Electronically Signed   By: Keith Rake M.D.   On: 10/17/2019 00:19    Procedures Procedures (including critical care time)  Medications Ordered in ED Medications  sodium chloride (PF) 0.9 % injection (has no administration in time range)  sodium chloride 0.9 % bolus 1,000 mL (has no administration in time range)  sodium chloride flush (NS) 0.9 % injection 3 mL (3 mLs Intravenous Given 10/16/19 2318)  sodium chloride 0.9 % bolus 1,000 mL (0 mLs Intravenous Stopped 10/17/19 0154)  HYDROmorphone (DILAUDID) injection 1 mg (1 mg Intravenous Given 10/16/19 2317)  iohexol (OMNIPAQUE) 300 MG/ML solution 100 mL (100 mLs Intravenous Contrast Given 10/16/19 2336)  sodium chloride 0.9 % bolus 1,000 mL (1,000 mLs Intravenous New Bag/Given 10/17/19 0108)  morphine 4 MG/ML injection 4 mg (4 mg Intravenous Given 10/17/19 0108)    ED Course  I have reviewed the triage vital signs and the nursing notes.  Pertinent labs & imaging results that were available during my care of the patient were reviewed by me and considered in my medical decision making (see chart for details).    MDM  Rules/Calculators/A&P                          34 year old female who presents to the ED today with 6 weeks of persistent diffuse abdominal pain, worsening the last 2 weeks with associated nausea, vomiting.  History of pancreatitis however patient states this feels different.  Recent cholecystectomy last year.  On arrival to the ED patient is noted to be afebrile.  She is however tachycardic and tachypneic.  During reassessment the patient continues to be tachycardic however her respirations have decreased.  Temperature was rechecked as it was 99.4 on arrival, 98.5.  I am less concerned about sepsis at this time however we will continue to monitor.  She has diffuse abdominal tenderness without rebound or guarding.    Blood work was obtained while patient was in the waiting room, white blood cell count noted to be elevated at 13.5.  Plan for CT scan to rule out infection. WBC  Date Value Ref Range Status  10/16/2019 13.5 (H) 4.0 - 10.5 K/uL Final  06/20/2018 7.8 4.0 - 10.5 K/uL Final  06/20/2017 16.0 (H) 3.4 - 10.8 x10E3/uL Final  09/01/2016 12.2 (H) 4.0 - 10.5 K/uL Final  08/31/2016 10.1 4.0 - 10.5 K/uL Final   CMP with a glucose of 129.  Alk phos elevated at 196.  No other findings at this time. Lipase at 128.  hCG negative.  Lactic acid within normal limits.  CT scan with findings concerning for acute pancreatitis as well as pseudocysts.  The cysts do not look infected at this time.  Have discussed with attending physician Dr. Kathrynn Humble who recommends symptomatic control and reassessment.  Patient is already received 1 L fluid bolus as well as Dilaudid.  On reassessment she continues to complain of pain and is continued to be tachycardic in the 110s.  Will provide additional fluid bolus and additional pain medication.   Patient's pain has somewhat improved however she continues to be tachycardic in the 110s.  It does not appear patient has a history of  tachycardia.  Have discussed admission  versus outpatient symptomatic treatment with patient and she feels more comfortable being admitted at this time.  Given ongoing tachycardia I feel this is reasonable for IV hydration.  Will consult for admission.   Discussed case with [facility] who recommends maintenance fluids. Will come see patient for admission.   This note was prepared using Dragon voice recognition software and may include unintentional dictation errors due to the inherent limitations of voice recognition software.  Final Clinical Impression(s) / ED Diagnoses Final diagnoses:  Acute pancreatitis, unspecified complication status, unspecified pancreatitis type    Rx / DC Orders ED Discharge Orders    None       Eustaquio Maize, PA-C 10/17/19 2518    Varney Biles, MD 10/17/19 [phone]

## 2019-10-17 ENCOUNTER — Inpatient Hospital Stay (HOSPITAL_COMMUNITY): Payer: BC Managed Care – PPO

## 2019-10-17 ENCOUNTER — Encounter (HOSPITAL_COMMUNITY): Payer: Self-pay | Admitting: Internal Medicine

## 2019-10-17 DIAGNOSIS — I471 Supraventricular tachycardia: Secondary | ICD-10-CM | POA: Diagnosis present

## 2019-10-17 DIAGNOSIS — Z6841 Body Mass Index (BMI) 40.0 and over, adult: Secondary | ICD-10-CM | POA: Diagnosis not present

## 2019-10-17 DIAGNOSIS — K852 Alcohol induced acute pancreatitis without necrosis or infection: Secondary | ICD-10-CM

## 2019-10-17 DIAGNOSIS — R188 Other ascites: Secondary | ICD-10-CM | POA: Diagnosis present

## 2019-10-17 DIAGNOSIS — I110 Hypertensive heart disease with heart failure: Secondary | ICD-10-CM | POA: Diagnosis present

## 2019-10-17 DIAGNOSIS — K8500 Idiopathic acute pancreatitis without necrosis or infection: Secondary | ICD-10-CM | POA: Diagnosis not present

## 2019-10-17 DIAGNOSIS — K219 Gastro-esophageal reflux disease without esophagitis: Secondary | ICD-10-CM | POA: Diagnosis present

## 2019-10-17 DIAGNOSIS — D638 Anemia in other chronic diseases classified elsewhere: Secondary | ICD-10-CM | POA: Diagnosis present

## 2019-10-17 DIAGNOSIS — E440 Moderate protein-calorie malnutrition: Secondary | ICD-10-CM | POA: Diagnosis present

## 2019-10-17 DIAGNOSIS — F17210 Nicotine dependence, cigarettes, uncomplicated: Secondary | ICD-10-CM | POA: Diagnosis present

## 2019-10-17 DIAGNOSIS — K863 Pseudocyst of pancreas: Secondary | ICD-10-CM | POA: Diagnosis present

## 2019-10-17 DIAGNOSIS — I10 Essential (primary) hypertension: Secondary | ICD-10-CM

## 2019-10-17 DIAGNOSIS — K859 Acute pancreatitis without necrosis or infection, unspecified: Secondary | ICD-10-CM | POA: Diagnosis present

## 2019-10-17 DIAGNOSIS — G629 Polyneuropathy, unspecified: Secondary | ICD-10-CM

## 2019-10-17 DIAGNOSIS — J4550 Severe persistent asthma, uncomplicated: Secondary | ICD-10-CM | POA: Diagnosis present

## 2019-10-17 DIAGNOSIS — R110 Nausea: Secondary | ICD-10-CM | POA: Diagnosis not present

## 2019-10-17 DIAGNOSIS — R748 Abnormal levels of other serum enzymes: Secondary | ICD-10-CM | POA: Diagnosis not present

## 2019-10-17 DIAGNOSIS — R109 Unspecified abdominal pain: Secondary | ICD-10-CM | POA: Diagnosis not present

## 2019-10-17 DIAGNOSIS — J302 Other seasonal allergic rhinitis: Secondary | ICD-10-CM | POA: Diagnosis present

## 2019-10-17 DIAGNOSIS — F329 Major depressive disorder, single episode, unspecified: Secondary | ICD-10-CM | POA: Diagnosis present

## 2019-10-17 DIAGNOSIS — E871 Hypo-osmolality and hyponatremia: Secondary | ICD-10-CM | POA: Diagnosis present

## 2019-10-17 DIAGNOSIS — I5032 Chronic diastolic (congestive) heart failure: Secondary | ICD-10-CM | POA: Diagnosis present

## 2019-10-17 DIAGNOSIS — R162 Hepatomegaly with splenomegaly, not elsewhere classified: Secondary | ICD-10-CM | POA: Diagnosis present

## 2019-10-17 DIAGNOSIS — K760 Fatty (change of) liver, not elsewhere classified: Secondary | ICD-10-CM | POA: Diagnosis present

## 2019-10-17 DIAGNOSIS — K862 Cyst of pancreas: Secondary | ICD-10-CM | POA: Diagnosis not present

## 2019-10-17 DIAGNOSIS — F1021 Alcohol dependence, in remission: Secondary | ICD-10-CM | POA: Diagnosis present

## 2019-10-17 DIAGNOSIS — F419 Anxiety disorder, unspecified: Secondary | ICD-10-CM | POA: Diagnosis present

## 2019-10-17 DIAGNOSIS — Z20822 Contact with and (suspected) exposure to covid-19: Secondary | ICD-10-CM | POA: Diagnosis present

## 2019-10-17 LAB — URINALYSIS, MICROSCOPIC (REFLEX)

## 2019-10-17 LAB — CBC WITH DIFFERENTIAL/PLATELET
Abs Immature Granulocytes: 0.04 10*3/uL (ref 0.00–0.07)
Basophils Absolute: 0.1 10*3/uL (ref 0.0–0.1)
Basophils Relative: 1 %
Eosinophils Absolute: 0.4 10*3/uL (ref 0.0–0.5)
Eosinophils Relative: 4 %
HCT: 36.6 % (ref 36.0–46.0)
Hemoglobin: 11.4 g/dL — ABNORMAL LOW (ref 12.0–15.0)
Immature Granulocytes: 0 %
Lymphocytes Relative: 25 %
Lymphs Abs: 2.5 10*3/uL (ref 0.7–4.0)
MCH: 28.9 pg (ref 26.0–34.0)
MCHC: 31.1 g/dL (ref 30.0–36.0)
MCV: 92.9 fL (ref 80.0–100.0)
Monocytes Absolute: 0.9 10*3/uL (ref 0.1–1.0)
Monocytes Relative: 9 %
Neutro Abs: 6 10*3/uL (ref 1.7–7.7)
Neutrophils Relative %: 61 %
Platelets: 353 10*3/uL (ref 150–400)
RBC: 3.94 MIL/uL (ref 3.87–5.11)
RDW: 13.6 % (ref 11.5–15.5)
WBC: 9.9 10*3/uL (ref 4.0–10.5)
nRBC: 0 % (ref 0.0–0.2)

## 2019-10-17 LAB — HEPATIC FUNCTION PANEL
ALT: 20 U/L (ref 0–44)
AST: 82 U/L — ABNORMAL HIGH (ref 15–41)
Albumin: 2.6 g/dL — ABNORMAL LOW (ref 3.5–5.0)
Alkaline Phosphatase: 268 U/L — ABNORMAL HIGH (ref 38–126)
Bilirubin, Direct: 0.2 mg/dL (ref 0.0–0.2)
Indirect Bilirubin: 0.8 mg/dL (ref 0.3–0.9)
Total Bilirubin: 1 mg/dL (ref 0.3–1.2)
Total Protein: 6.3 g/dL — ABNORMAL LOW (ref 6.5–8.1)

## 2019-10-17 LAB — URINALYSIS, ROUTINE W REFLEX MICROSCOPIC
Glucose, UA: NEGATIVE mg/dL
Ketones, ur: NEGATIVE mg/dL
Leukocytes,Ua: NEGATIVE
Nitrite: NEGATIVE
Protein, ur: 100 mg/dL — AB
Specific Gravity, Urine: <1.005 — ABNORMAL LOW (ref 1.005–1.030)
pH: 6.5 (ref 5.0–8.0)

## 2019-10-17 LAB — BASIC METABOLIC PANEL
Anion gap: 10 (ref 5–15)
BUN: 8 mg/dL (ref 6–20)
CO2: 24 mmol/L (ref 22–32)
Calcium: 7.9 mg/dL — ABNORMAL LOW (ref 8.9–10.3)
Chloride: 102 mmol/L (ref 98–111)
Creatinine, Ser: 0.77 mg/dL (ref 0.44–1.00)
GFR calc Af Amer: >60 mL/min (ref >60)
GFR calc non Af Amer: >60 mL/min (ref >60)
Glucose, Bld: 105 mg/dL — ABNORMAL HIGH (ref 70–99)
Potassium: 3.7 mmol/L (ref 3.5–5.1)
Sodium: 136 mmol/L (ref 135–145)

## 2019-10-17 LAB — SARS CORONAVIRUS 2 BY RT PCR (HOSPITAL ORDER, PERFORMED IN ~~LOC~~ HOSPITAL LAB): SARS Coronavirus 2: NEGATIVE

## 2019-10-17 LAB — TRIGLYCERIDES: Triglycerides: 112 mg/dL (ref <150)

## 2019-10-17 LAB — HIV ANTIBODY (ROUTINE TESTING W REFLEX): HIV Screen 4th Generation wRfx: NONREACTIVE

## 2019-10-17 MED ORDER — METOPROLOL SUCCINATE ER 50 MG PO TB24
50.0000 mg | ORAL_TABLET | Freq: Every day | ORAL | Status: DC
  Administered 2019-10-18 – 2019-10-31 (×13): 50 mg via ORAL
  Filled 2019-10-17 (×15): qty 1

## 2019-10-17 MED ORDER — BUPROPION HCL ER (SR) 150 MG PO TB12: 150.0000 mg | ORAL_TABLET | ORAL | Status: DC

## 2019-10-17 MED ORDER — MELATONIN 5 MG PO TABS
10.0000 mg | ORAL_TABLET | Freq: Every day | ORAL | Status: DC
  Administered 2019-10-17 – 2019-10-30 (×14): 10 mg via ORAL
  Filled 2019-10-17 (×15): qty 2

## 2019-10-17 MED ORDER — ALBUTEROL SULFATE (2.5 MG/3ML) 0.083% IN NEBU: 2.5000 mg | INHALATION_SOLUTION | RESPIRATORY_TRACT | Status: DC | PRN

## 2019-10-17 MED ORDER — PROCHLORPERAZINE EDISYLATE 10 MG/2ML IJ SOLN
10.0000 mg | INTRAMUSCULAR | Status: AC | PRN
  Administered 2019-10-17 – 2019-10-25 (×3): 10 mg via INTRAVENOUS
  Filled 2019-10-17 (×3): qty 2

## 2019-10-17 MED ORDER — GABAPENTIN 300 MG PO CAPS
900.0000 mg | ORAL_CAPSULE | Freq: Two times a day (BID) | ORAL | Status: DC
  Administered 2019-10-17 – 2019-10-31 (×28): 900 mg via ORAL
  Filled 2019-10-17 (×28): qty 3

## 2019-10-17 MED ORDER — HYDROMORPHONE HCL 1 MG/ML IJ SOLN
1.0000 mg | INTRAMUSCULAR | Status: DC | PRN
  Administered 2019-10-17 – 2019-10-29 (×99): 1 mg via INTRAVENOUS
  Filled 2019-10-17 (×99): qty 1

## 2019-10-17 MED ORDER — QUETIAPINE FUMARATE 100 MG PO TABS
100.0000 mg | ORAL_TABLET | Freq: Every day | ORAL | Status: DC
  Administered 2019-10-17 – 2019-10-30 (×14): 100 mg via ORAL
  Filled 2019-10-17 (×14): qty 1

## 2019-10-17 MED ORDER — BUPROPION HCL ER (SR) 150 MG PO TB12
150.0000 mg | ORAL_TABLET | Freq: Every day | ORAL | Status: DC
  Administered 2019-10-18 – 2019-10-31 (×14): 150 mg via ORAL
  Filled 2019-10-17 (×14): qty 1

## 2019-10-17 MED ORDER — FLUTICASONE FUROATE-VILANTEROL 200-25 MCG/INH IN AEPB
1.0000 | INHALATION_SPRAY | Freq: Every day | RESPIRATORY_TRACT | Status: DC
  Administered 2019-10-18 – 2019-10-31 (×14): 1 via RESPIRATORY_TRACT
  Filled 2019-10-17: qty 28

## 2019-10-17 MED ORDER — BUPROPION HCL ER (SR) 150 MG PO TB12
300.0000 mg | ORAL_TABLET | Freq: Every day | ORAL | Status: DC
  Administered 2019-10-17 – 2019-10-30 (×14): 300 mg via ORAL
  Filled 2019-10-17 (×14): qty 2

## 2019-10-17 MED ORDER — UMECLIDINIUM BROMIDE 62.5 MCG/INH IN AEPB
1.0000 | INHALATION_SPRAY | Freq: Every day | RESPIRATORY_TRACT | Status: DC
  Administered 2019-10-18 – 2019-10-31 (×14): 1 via RESPIRATORY_TRACT
  Filled 2019-10-17 (×2): qty 7

## 2019-10-17 MED ORDER — METOPROLOL TARTRATE 5 MG/5ML IV SOLN
2.5000 mg | Freq: Four times a day (QID) | INTRAVENOUS | Status: DC
  Administered 2019-10-17 (×2): 2.5 mg via INTRAVENOUS
  Filled 2019-10-17 (×2): qty 5

## 2019-10-17 MED ORDER — MORPHINE SULFATE (PF) 4 MG/ML IV SOLN
4.0000 mg | Freq: Once | INTRAVENOUS | Status: AC
  Administered 2019-10-17: 4 mg via INTRAVENOUS
  Filled 2019-10-17: qty 1

## 2019-10-17 MED ORDER — HYDRALAZINE HCL 20 MG/ML IJ SOLN: 10.0000 mg | INTRAMUSCULAR | Status: DC | PRN

## 2019-10-17 MED ORDER — SODIUM CHLORIDE 0.9 % IV BOLUS
1000.0000 mL | Freq: Once | INTRAVENOUS | Status: AC
  Administered 2019-10-17: 1000 mL via INTRAVENOUS

## 2019-10-17 MED ORDER — SERTRALINE HCL 50 MG PO TABS
75.0000 mg | ORAL_TABLET | Freq: Every day | ORAL | Status: DC
  Administered 2019-10-18 – 2019-10-31 (×14): 75 mg via ORAL
  Filled 2019-10-17 (×14): qty 1

## 2019-10-17 MED ORDER — LACTATED RINGERS IV SOLN: INTRAVENOUS | Status: AC

## 2019-10-17 NOTE — H&P (Signed)
History and Physical    Kara [name] ZOX:[id number] DOB: 04/08/1986 DOA: 10/16/2019  PCP: Daisy Florooss, Charles Alan, MD  Patient coming from: Home.  Chief Complaint: Abdominal pain.  HPI: Kara [name] is a 34 y.o. female with history of hypertension, severe asthma presently controlled with previous history of alcoholism has not had any alcohol for last 3 years has had previous history of alcohol induced pancreatitis [place] has had subsequent cholecystectomy presents to the ER because of worsening abdominal pain.  Patient states she has been having abdominal pain mostly in the epigastric area over the last 2 months which is acutely worsened over the last few days.  Pain is across the abdomen with some nausea and has not moved bowels for last 2 days.  Denies any fever or chills.  Patient states the pain is slightly different from the usual pancreatitis.    ED Course: In the ER patient was tachycardic afebrile Covid test negative labs show lactic acid of 1 WBC 13.5 BUN 10 creatinine 1.8 hematocrit 41.3 calcium 9.1 lipase 128 normal LFTs CT abdomen pelvis shows features concerning for edematous changes in the pancreatic head and uncinate process with possible pseudocyst formation.  Patient started on IV fluids pain relief medication and admitted for acute pancreatitis.  Review of Systems: As per HPI, rest all negative.   Past Medical History:  Diagnosis Date  . Alcoholism in remission (HCC)    per pt in remission since 2017  . Anemia   . AR (allergic rhinitis)    allergist-- dr Lucie Leatherkozlow  . Biliary dyskinesia   . Diastolic CHF (HCC)    followd by dr Demetrius Charityp. Eden Emmsnishan  . Environmental allergies   . GERD (gastroesophageal reflux disease)   . Headache   . History of acute pancreatitis    alcoholic pancreatitis ,  2016 and 2017  . History of sepsis 06/12/2016   with CAP  . History of syncope   . 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 hematoma due to coumadin treated with IR embolization of the IMA branches  . Hypertension   . Polyneuropathy   . Prolonged QT syndrome    cardiologist-- dr Eden Emmsnishan (notes in epic)  . Severe persistent asthma    pulmonologist-- dr dr Belva Cromep. mannam (notes in epic)    Past Surgical History:  Procedure Laterality Date  . CHOLECYSTECTOMY N/A 06/21/2018   Procedure: LAPAROSCOPIC CHOLECYSTECTOMY;  Surgeon: Abigail MiyamotoBlackman, Douglas, MD;  Location: [facility];  Service: General;  Laterality: N/A;  . ESOPHAGOGASTRODUODENOSCOPY N/A 07/17/2014   Procedure: ESOPHAGOGASTRODUODENOSCOPY (EGD);  Surgeon: Dorena CookeyJohn Hayes, MD;  [facility];  Service: Endoscopy;  Laterality: N/A;  . TYMPANOSTOMY TUBE PLACEMENT Bilateral 11 MONTHS OLD  . VIDEO BRONCHOSCOPY Bilateral 10/08/2016   Procedure: VIDEO BRONCHOSCOPY WITH FLUORO;  Surgeon: Chilton GreathouseMannam, Praveen, MD;  Location: [facility];  Service: Cardiopulmonary;  Laterality: Bilateral;     reports that she has been smoking cigarettes. She has a 2.50 pack-year smoking history. She has never used smokeless tobacco. She reports previous alcohol use. She reports previous drug use.  Allergies  Allergen Reactions  . Nsaids Other (See Comments)    Upset stomach   . Peanuts [Peanut Oil] Hives and Itching    "ONLY TREE NUTS"    Family History  Problem Relation Age of Onset  . Heart failure Mother   . Heart failure Father   . Diabetes Father   . Heart failure Maternal  Grandfather     Prior to Admission medications   Medication Sig Start Date End Date Taking? Authorizing Provider  albuterol (PROVENTIL) (2.5 MG/3ML) 0.083% nebulizer solution Take 3 mLs (2.5 mg total) by nebulization every 4 (four) hours as needed for wheezing or shortness of breath. 05/03/19  Yes Ambs, Norvel Richards, FNP  albuterol (VENTOLIN HFA) 108 (90 Base) MCG/ACT inhaler 2 puffs every 4 hours as needed for coughing or wheezing. 06/04/19  Yes Ambs, Norvel Richards, FNP  BREO ELLIPTA 200-25  MCG/INH AEPB INHALE 1 PUFF INTO THE LUNGS DAILY 05/28/19  Yes Kozlow, Alvira Philips, MD  buPROPion [facility] SR) 150 MG 12 hr tablet Take 150-300 mg by mouth See admin instructions. Takes 1 tablet in the morning and 2 tablets at bedtime 04/10/19  Yes [provider]  chlorhexidine (PERIDEX) 0.12 % solution Use as directed 15 mLs in the mouth or throat 2 (two) times daily. After meals Patient taking differently: Use as directed 15 mLs in the mouth or throat daily as needed (dental plaque). After meals  05/03/19  Yes Ambs, Norvel Richards, FNP  Cholecalciferol (VITAMIN D PO) Take 1 tablet by mouth daily.    Yes [provider]  DUPIXENT 300 MG/2ML prefilled syringe INJECT 300 MG SUBCUTANEOUSLY EVERY OTHER WEEK Patient taking differently: Inject 300 mg into the skin every 14 (fourteen) days.  04/09/19  Yes Kozlow, Alvira Philips, MD  EPINEPHrine (EPIPEN 2-PAK) 0.3 mg/0.3 mL IJ SOAJ injection Inject 0.3 mLs (0.3 mg total) into the muscle once as needed (for severe allergic reaction). 07/11/18  Yes Kozlow, Alvira Philips, MD  furosemide (LASIX) 20 MG tablet Take 1 tablet (20 mg total) by mouth daily. Patient taking differently: Take 20 mg by mouth daily as needed for fluid.  06/11/19  Yes Wendall Stade, MD  gabapentin (NEURONTIN) 300 MG capsule TAKE 3 CAPSULES BY MOUTH TWICE DAILY Patient taking differently: Take 900 mg by mouth 2 (two) times daily.  12/27/18  Yes Sater, Pearletha Furl, MD  Melatonin 10 MG TABS Take 10 mg by mouth at bedtime.   Yes [provider]  metoprolol succinate (TOPROL-XL) 50 MG 24 hr tablet Take 1 tablet (50 mg total) by mouth daily. 06/11/19  Yes Wendall Stade, MD  montelukast (SINGULAIR) 10 MG tablet TAKE 1 TABLET(10 MG) BY MOUTH AT BEDTIME Patient taking differently: Take 10 mg by mouth at bedtime.  05/03/19  Yes Ambs, Norvel Richards, FNP  Multiple Vitamins-Minerals (MULTIVITAMIN WITH MINERALS) tablet Take 1 tablet by mouth daily.   Yes [provider]  omeprazole (PRILOSEC) 40 MG  capsule TAKE 1 CAPSULE(40 MG) BY MOUTH DAILY Patient taking differently: Take 40 mg by mouth daily.  05/28/19  Yes Ambs, Norvel Richards, FNP  QUEtiapine (SEROQUEL) 100 MG tablet Take 100 mg by mouth at bedtime. 03/23/19  Yes [provider]  rosuvastatin (CRESTOR) 10 MG tablet Take 10 mg by mouth daily.   Yes [provider]  sertraline (ZOLOFT) 50 MG tablet Take 75 mg by mouth daily.   Yes [provider]  thiamine 100 MG tablet Take 100 mg by mouth daily.   Yes [provider]  umeclidinium bromide (INCRUSE ELLIPTA) 62.5 MCG/INH AEPB Inhale 1 puff into the lungs daily. 05/03/19  Yes Ambs, Norvel Richards, FNP    Physical Exam: Constitutional: Moderately built and nourished. Vitals:   10/17/19 0300 10/17/19 0400 10/17/19 0500 10/17/19 0530  BP: 133/90 122/81 130/82 131/76  Pulse: (!) 111 (!) 113 (!) 109 (!) 116  Resp: 17  15 17 14   Temp:      TempSrc:      SpO2: 94% 93% 97% 98%   Eyes: Anicteric no pallor. ENMT: No discharge from the ears eyes nose or mouth. Neck: No mass felt.  No neck rigidity. Respiratory: No rhonchi or crepitations. Cardiovascular: S1-S2 heard. Abdomen: Soft mild epigastric tenderness no guarding or rigidity. Musculoskeletal: No edema. Skin: No rash. Neurologic: Alert awake oriented to time place and person.  Moves all extremities. Psychiatric: Appears normal.  Normal affect.   Labs on Admission: I have personally reviewed following labs and imaging studies  CBC: Recent Labs  Lab 10/16/19 2000  WBC 13.5*  HGB 13.0  HCT 41.3  MCV 90.8  PLT 434*   Basic Metabolic Panel: Recent Labs  Lab 10/16/19 2000  NA 138  K 3.7  CL 100  CO2 26  GLUCOSE 129*  BUN 10  CREATININE 0.80  CALCIUM 9.1   GFR: CrCl cannot be calculated (Unknown ideal weight.). Liver Function Tests: Recent Labs  Lab 10/16/19 2000  AST 21  ALT 13  ALKPHOS 196*  BILITOT 0.9  PROT 7.6  ALBUMIN 3.3*   Recent Labs  Lab 10/16/19 2000  LIPASE 128*   No  results for input(s): AMMONIA in the last 168 hours. Coagulation Profile: No results for input(s): INR, PROTIME in the last 168 hours. Cardiac Enzymes: No results for input(s): CKTOTAL, CKMB, CKMBINDEX, TROPONINI in the last 168 hours. BNP (last 3 results) No results for input(s): PROBNP in the last 8760 hours. HbA1C: No results for input(s): HGBA1C in the last 72 hours. CBG: No results for input(s): GLUCAP in the last 168 hours. Lipid Profile: No results for input(s): CHOL, HDL, LDLCALC, TRIG, CHOLHDL, LDLDIRECT in the last 72 hours. Thyroid Function Tests: No results for input(s): TSH, T4TOTAL, FREET4, T3FREE, THYROIDAB in the last 72 hours. Anemia Panel: No results for input(s): VITAMINB12, FOLATE, FERRITIN, TIBC, IRON, RETICCTPCT in the last 72 hours. Urine analysis:    Component Value Date/Time   COLORURINE YELLOW 06/12/2016 2029   APPEARANCEUR HAZY (A) 06/12/2016 2029   LABSPEC 1.020 06/12/2016 2029   PHURINE 5.0 06/12/2016 2029   GLUCOSEU NEGATIVE 06/12/2016 2029   HGBUR MODERATE (A) 06/12/2016 2029   BILIRUBINUR NEGATIVE 06/12/2016 2029   KETONESUR NEGATIVE 06/12/2016 2029   PROTEINUR 30 (A) 06/12/2016 2029   UROBILINOGEN 1.0 02/16/2015 1231   NITRITE NEGATIVE 06/12/2016 2029   LEUKOCYTESUR NEGATIVE 06/12/2016 2029   Sepsis Labs: @LABRCNTIP (procalcitonin:4,lacticidven:4) ) Recent Results (from the past 240 hour(s))  SARS Coronavirus 2 by RT PCR (hospital order, performed in [facility] hospital lab) Nasopharyngeal Nasopharyngeal Swab     Status: None   Collection Time: 10/17/19  3:38 AM   Specimen: Nasopharyngeal Swab  Result Value Ref Range Status   SARS Coronavirus 2 NEGATIVE NEGATIVE Final    Comment: (NOTE) SARS-CoV-2 target nucleic acids are NOT DETECTED.  The SARS-CoV-2 RNA is generally detectable in upper and lower respiratory specimens during the acute phase of infection. The lowest concentration of SARS-CoV-2 viral copies this assay can detect is  250 copies / mL. A negative result does not preclude SARS-CoV-2 infection and should not be used as the sole basis for treatment or other patient management decisions.  A negative result may occur with improper specimen collection / handling, submission of specimen other than nasopharyngeal swab, presence of viral mutation(s) within the areas targeted by this assay, and inadequate number of viral copies (<250 copies / mL). A negative result must be combined  with clinical observations, patient history, and epidemiological information.  Fact Sheet for Patients:   BoilerBrush.com.cy  Fact Sheet for Healthcare Providers: https://pope.com/  This test is not yet approved or  cleared by the [place] FDA and has been authorized for detection and/or diagnosis of SARS-CoV-2 by FDA under an Emergency Use Authorization (EUA).  This EUA will remain in effect (meaning this test can be used) for the duration of the COVID-19 declaration under Section 564(b)(1) of the Act, 21 U.S.C. section 360bbb-3(b)(1), unless the authorization is terminated or revoked sooner.  Performed at [facility], [address], [place], [place] [address]      Radiological Exams on Admission: CT Abdomen Pelvis W Contrast  Result Date: 10/17/2019 CLINICAL DATA:  Acute nonlocalized abdominal pain. Patient reports decreased appetite and weight loss. EXAM: CT ABDOMEN AND PELVIS WITH CONTRAST TECHNIQUE: Multidetector CT imaging of the abdomen and pelvis was performed using the standard protocol following bolus administration of intravenous contrast. CONTRAST:  OMNIPAQUE IOHEXOL 300 MG/ML  SOLN COMPARISON:  Abdominal ultrasound 05/24/2018.  CT 09/09/2015 FINDINGS: Lower chest: Elevated left hemidiaphragm. Left basilar consolidation likely compressive atelectasis. Hepatobiliary: Diffusely decreased hepatic density consistent with steatosis. Liver is  enlarged spanning 24 cm cranial caudal. No evidence of focal hepatic lesion. Clips in the gallbladder fossa postcholecystectomy. No biliary dilatation. Pancreas: Edematous appearance of the pancreatic head and uncinate process. Low-density in the uncinate process measuring 10 mm suspicious for developing intrapancreatic pseudocyst. 17 mm hypodensity in the pancreatic tail likely pseudocyst. Peripancreatic fat stranding extending along the proximal body and to a lesser extent the tail. There multiple ill-defined peripherally enhancing fluid collections in the left upper quadrant. A crescentic fluid collection courses anteriorly, posteriorly, and anterior superior to the spleen. Superior component measures 6.2 x 2.9 x 2.4 cm under the left hemidiaphragm, series 2, image 9. Collection immediately anterior to the spleen measures 7.1 x 5.1 x 5.8 cm, series 2 image 27. There is likely thin contiguous component to an ellipsoid fluid collection more anteriorly measuring 6 x 2.0 x 3.2 cm, series 2, image 27. Spleen: Perisplenic fluid collections as described. 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 No intrasplenic fluid collection. Upper normal spleen size. Adrenals/Urinary Tract: Normal adrenal glands. No hydronephrosis or perinephric edema. Homogeneous renal enhancement. Urinary bladder is nondistended. Stomach/Bowel: Stomach partially distended. Mild duodenal wall thickening/stranding likely reactive. There is no small bowel distention or obstruction. Scattered fluid-filled colon without colonic wall thickening or inflammation. Vascular/Lymphatic: Patent portal vein. 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 No adenopathy. Reproductive: Uterus and ovaries are unremarkable. Other: Small amount of upper abdominal ascites. Small to moderate volume of non organized free fluid in the pelvis, likely tracking from the pericolic gutters. Fluid collection anterior to the  left psoas muscle is diminished in size from 2017, currently measuring 4.1 x 2.6 cm, previously 6.6 x 3.8 cm. This is adjacent embolization coils and likely sequela of prior retroperitoneal hemorrhage. There is no free air. Musculoskeletal: There are no acute or suspicious osseous abnormalities. IMPRESSION: 1. Acute edematous appearance of the pancreatic head and uncinate process, suspicious for acute pancreatitis. Low-density in the uncinate process measuring 10 mm in 17 mm area of low-density in the pancreatic tail likely developing intrapancreatic pseudocysts. 2. Multiple ill-defined peripherally enhancing fluid collections in the left upper quadrant, largest measuring 7.1 x 5.1 x 5.8 cm. Suspect pseudocysts, new from 2017 exam. 3. 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 4. Small amount of  ascites in the upper abdomen. Small to moderate volume of non organized free fluid in the pelvis, likely tracking from the pericolic gutters. 5. Hepatomegaly and hepatic steatosis. 6. Decreased size of fluid collection anterior to the left psoas muscle, currently measuring 4.1 x 2.6 cm, previously 6.6 x 3.8 cm. This is adjacent embolization coils and likely sequela of prior retroperitoneal hemorrhage. 7. Elevated left hemidiaphragm with left basilar consolidation likely compressive atelectasis. Electronically Signed   By: Narda Rutherford M.D.   On: 10/17/2019 00:19     Assessment/Plan Principal Problem:   Acute pancreatitis Active Problems:   Polyneuropathy   Benign essential HTN   Asthma, severe persistent, well-controlled    1. Acute pancreatitis with possible pseudocyst formation -patient has had history of acute pancreatitis secondary to alcoholism presently has not had any alcohol for the last 3 years.  Patient also had recent cholecystectomy.  Will get MRCP keep patient n.p.o. aggressive hydration pain relief medication.  Check triglyceride with next blood  draw. 2. History of hypertension presently n.p.o. we will keep patient on scheduled dose of IV metoprolol and as needed IV hydralazine. 3. History of severe asthma presently not wheezing continue home inhalers. 4. History of polyneuropathy on gabapentin presently n.p.o. 5. History of depression presently n.p.o.  Given that patient has having acute pancreatitis will need close monitoring for any acute deterioration in inpatient status.   DVT prophylaxis: SCDs for now until we get MRCP results. Code Status: Full code. Family Communication: Discussed with patient. Disposition Plan: Home. Consults called: None. Admission status: Inpatient.   Eduard Clos MD [facility] Pager [phone].  If 7PM-7AM, please contact night-coverage [email or web address] Password [facility]  10/17/2019, 6:13 AM

## 2019-10-17 NOTE — ED Notes (Signed)
Pt in MRI.

## 2019-10-17 NOTE — Progress Notes (Signed)
Patient was seen and examined.  She was still in the emergency room when I examined her.  She was looking very comfortable talking to other providers.  Admitted early morning hours by nighttime hospitalist.  See H&P for details. In brief, she is 34 year old with history of hypertension, asthma, previously alcoholic who quit for 3 years and history of alcohol induced pancreatitis, cholecystectomy after an episode of pancreatitis last year, last exacerbation in May 2020 and none since then presents with worsening abdominal pain.  Ongoing for about 2 months but worse for 2 days. In the emergency room, tachycardic, afebrile, WBC 13.5.  Renal functions normal.  Calcium normal.  MRI of the abdomen shows multiple uncomplicated pancreatic cyst and acute inflammation on the head of the pancreas.  Lipase is 128, however patient has chronic pancreatitis.  She is also on multiple medications for her anxiety and depression. On my examination, she was inquiring about any changes I will make on her Dilaudid regimen.  She is very well experienced about pancreatitis treatment and she thinks it will take another 24 to 48 hours for her to settle down.  Plan: Fairly stabilized and fluid resuscitated.  N.p.o. with ice chips.  IV pain medication, decrease IV fluid to 100 mL/h.  Intake and output monitoring.  Mobilize. Resume all her home medications including antianxiety and antidepressants, sleep medications. Resume metoprolol, will hold off on a statin. Recheck LFT, renal function test and lipase in the morning.

## 2019-10-18 LAB — CBC WITH DIFFERENTIAL/PLATELET
Abs Immature Granulocytes: 0.02 10*3/uL (ref 0.00–0.07)
Basophils Absolute: 0.1 10*3/uL (ref 0.0–0.1)
Basophils Relative: 1 %
Eosinophils Absolute: 0.2 10*3/uL (ref 0.0–0.5)
Eosinophils Relative: 3 %
HCT: 33.7 % — ABNORMAL LOW (ref 36.0–46.0)
Hemoglobin: 10.5 g/dL — ABNORMAL LOW (ref 12.0–15.0)
Immature Granulocytes: 0 %
Lymphocytes Relative: 30 %
Lymphs Abs: 2.2 10*3/uL (ref 0.7–4.0)
MCH: 28.7 pg (ref 26.0–34.0)
MCHC: 31.2 g/dL (ref 30.0–36.0)
MCV: 92.1 fL (ref 80.0–100.0)
Monocytes Absolute: 0.6 10*3/uL (ref 0.1–1.0)
Monocytes Relative: 8 %
Neutro Abs: 4.3 10*3/uL (ref 1.7–7.7)
Neutrophils Relative %: 58 %
Platelets: 258 10*3/uL (ref 150–400)
RBC: 3.66 MIL/uL — ABNORMAL LOW (ref 3.87–5.11)
RDW: 13.3 % (ref 11.5–15.5)
WBC: 7.3 10*3/uL (ref 4.0–10.5)
nRBC: 0 % (ref 0.0–0.2)

## 2019-10-18 LAB — COMPREHENSIVE METABOLIC PANEL
ALT: 17 U/L (ref 0–44)
AST: 38 U/L (ref 15–41)
Albumin: 2.4 g/dL — ABNORMAL LOW (ref 3.5–5.0)
Alkaline Phosphatase: 244 U/L — ABNORMAL HIGH (ref 38–126)
Anion gap: 10 (ref 5–15)
BUN: 6 mg/dL (ref 6–20)
CO2: 24 mmol/L (ref 22–32)
Calcium: 8.1 mg/dL — ABNORMAL LOW (ref 8.9–10.3)
Chloride: 103 mmol/L (ref 98–111)
Creatinine, Ser: 0.61 mg/dL (ref 0.44–1.00)
GFR calc Af Amer: >60 mL/min (ref >60)
GFR calc non Af Amer: >60 mL/min (ref >60)
Glucose, Bld: 85 mg/dL (ref 70–99)
Potassium: 3.5 mmol/L (ref 3.5–5.1)
Sodium: 137 mmol/L (ref 135–145)
Total Bilirubin: 1.1 mg/dL (ref 0.3–1.2)
Total Protein: 5.7 g/dL — ABNORMAL LOW (ref 6.5–8.1)

## 2019-10-18 LAB — LIPASE, BLOOD: Lipase: 119 U/L — ABNORMAL HIGH (ref 11–51)

## 2019-10-18 LAB — PHOSPHORUS: Phosphorus: 3.3 mg/dL (ref 2.5–4.6)

## 2019-10-18 LAB — MAGNESIUM: Magnesium: 1.6 mg/dL — ABNORMAL LOW (ref 1.7–2.4)

## 2019-10-18 MED ORDER — MAGNESIUM SULFATE 2 GM/50ML IV SOLN
2.0000 g | Freq: Once | INTRAVENOUS | Status: AC
  Administered 2019-10-18: 2 g via INTRAVENOUS
  Filled 2019-10-18: qty 50

## 2019-10-18 MED ORDER — ENOXAPARIN SODIUM 60 MG/0.6ML ~~LOC~~ SOLN
50.0000 mg | SUBCUTANEOUS | Status: DC
  Administered 2019-10-18 – 2019-10-20 (×3): 50 mg via SUBCUTANEOUS
  Filled 2019-10-18 (×2): qty 0.6

## 2019-10-18 NOTE — Progress Notes (Signed)
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  Ongoing for about 2 months but worse for 2 days. In the emergency room, tachycardic, afebrile, WBC 13.5.  Renal functions normal.  Calcium normal.  MRI of the abdomen shows multiple uncomplicated pancreatic cyst and acute inflammation on the head of the pancreas.  Lipase is 128. Patient was admitted for further management.  Subjective: No fever overnight Getting iv dilaudid and iv compazine as needed for vomitign last night no nausea this am "tightness in upper abdomen this am, would like to try clears. Lipase down to 119  Assessment & Plan:  Acute pancreatitis: Clinically improving.  Lipase down.  MRCP reviewed "1. Mild inflammatory stranding about the pancreas, consistent with acute pancreatitis. 2. There is an ill-defined area of fluid signal within the pancreatic head measuring approximately 1.3 cm. This may represent a small developing acute pancreatic fluid collection. 3. 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  TG level is 112.  Lactic acid 1.2 on admission.  Leukocytosis likely from #113.5 on admission now resolved.  Polyneuropathy-stable  Benign essential HTN: BP is controlled.  History of asthma, severe persistent, well-controlled  Hypomagnesemia: Replete and recheck.  DVT prophylaxis: enoxaparin (LOVENOX) injection 40 mg  Start: 10/18/19 0830 SCDs Start: 10/17/19 5681 Code Status: full Family Communication: plan of care discussed with patient at bedside.  Status is: Inpatient  Remains inpatient appropriate because:IV treatments appropriate due to intensity of illness or inability to take PO and Inpatient level of care appropriate due to severity of illness   Dispo: The patient is from: Home Home, independent.              Anticipated d/c is to: Home              Anticipated d/c date is: 1 day              Patient currently is not medically stable to d/c.  Planning for discharge home once able to take p.o. well and no pain  Nutrition: Diet Order            Diet clear liquid Room service appropriate? Yes; Fluid consistency: Thin  Diet effective now                 Body mass index is 38.27 kg/m.  Consultants:see note  Procedures:see note Microbiology:see note  Medications: Scheduled Meds: . buPROPion  150 mg Oral Daily  . buPROPion  300 mg Oral QHS  . enoxaparin (LOVENOX) injection  40 mg Subcutaneous Q24H  . fluticasone furoate-vilanterol  1 puff Inhalation Daily  . gabapentin  900 mg Oral BID  . melatonin  10 mg Oral QHS  . metoprolol succinate  50 mg Oral Daily  . QUEtiapine  100 mg Oral QHS  . sertraline  75 mg Oral Daily  . umeclidinium bromide  1 puff Inhalation Daily   Continuous Infusions: . magnesium sulfate bolus IVPB      Antimicrobials: Anti-infectives (From admission, onward)   None  Objective: Vitals: Today's Vitals   10/18/19 0309 10/18/19 0340 10/18/19 0438 10/18/19 0553  BP:   118/85   Pulse:   (!) 109   Resp:   19   Temp:   98.4 F (36.9 C)   TempSrc:      SpO2:   96%   Weight:   104.3 kg   PainSc: 8  Asleep  8     Intake/Output Summary (Last 24 hours) at 10/18/2019 0817 Last data filed at 10/18/2019 0600 Gross per 24 hour  Intake 2668.09 ml  Output 10 ml  Net 2658.09 ml   Filed Weights   10/18/19 0438  Weight: 104.3 kg   Weight change:      Intake/Output from previous day: 06/30 0701 - 07/01 0700 In: 2668.1 [I.V.:2668.1] Out: 10 [Emesis/NG output:10] Intake/Output this shift: No intake/output data recorded.  Examination:  General exam: AAOx3,NAD, weak appearing. HEENT:Oral mucosa moist, Ear/Nose WNL grossly,dentition normal. Respiratory system: bilaterally *clear,no wheezing or crackles,no use of accessory muscle, non tender. Cardiovascular system: S1 & S2 +, regular, No JVD. Gastrointestinal system: Abdomen soft,Tender upper abdomen mild,ND, BS+. Nervous System:Alert, awake, moving extremities and grossly nonfocal Extremities: No edema, distal peripheral pulses palpable.  Skin: No rashes,no icterus. MSK: Normal muscle bulk,tone, power  Data Reviewed: I have personally reviewed following labs and imaging studies CBC: Recent Labs  Lab 10/16/19 2000 10/17/19 0628 10/18/19 0316  WBC 13.5* 9.9 7.3  NEUTROABS  --  6.0 4.3  HGB 13.0 11.4* 10.5*  HCT 41.3 36.6 33.7*  MCV 90.8 92.9 92.1  PLT 434* 353 258   Basic Metabolic Panel: Recent Labs  Lab 10/16/19 2000 10/17/19 0628 10/18/19 0316  NA 138 136 137  K 3.7 3.7 3.5  CL 100 102 103  CO2 26 24 24   GLUCOSE 129* 105* 85  BUN 10 8 6   CREATININE 0.80 0.77 0.61  CALCIUM 9.1 7.9* 8.1*  MG  --   --  1.6*  PHOS  --   --  3.3   GFR: Estimated Creatinine Clearance: 117.6 mL/min (by C-G formula based on SCr of 0.61 mg/dL). Liver Function Tests: Recent Labs  Lab 10/16/19 2000 10/17/19 0628 10/18/19 0316  AST 21 82* 38  ALT 13 20 17   ALKPHOS 196* 268* 244*  BILITOT 0.9 1.0 1.1  PROT 7.6 6.3* 5.7*  ALBUMIN 3.3* 2.6* 2.4*   Recent Labs  Lab 10/16/19 2000 10/18/19 0316  LIPASE 128* 119*   No results for input(s): AMMONIA in the last 168 hours. Coagulation Profile: No results for input(s): INR, PROTIME in the last 168 hours. Cardiac Enzymes: No results for input(s): CKTOTAL, CKMB, CKMBINDEX, TROPONINI in the last 168 hours. BNP (last 3 results) No  results for input(s): PROBNP in the last 8760 hours. HbA1C: No results for input(s): HGBA1C in the last 72 hours. CBG: No results for input(s): GLUCAP in the last 168 hours. Lipid Profile: Recent Labs    10/17/19 0628  TRIG 112   Thyroid Function Tests: No results for input(s): TSH, T4TOTAL, FREET4, T3FREE, THYROIDAB in the last 72 hours. Anemia Panel: No results for input(s): VITAMINB12, FOLATE, FERRITIN, TIBC, IRON, RETICCTPCT in the last 72 hours. Sepsis Labs: Recent Labs  Lab 10/16/19 2313  LATICACIDVEN 1.0    Recent Results (from the past 240 hour(s))  SARS Coronavirus 2 by RT PCR (hospital order, performed in [facility] hospital lab) Nasopharyngeal Nasopharyngeal Swab     Status: None   Collection Time: 10/17/19  3:38 AM   Specimen: Nasopharyngeal  Swab  Result Value Ref Range Status   SARS Coronavirus 2 NEGATIVE NEGATIVE Final    Comment: (NOTE) SARS-CoV-2 target nucleic acids are NOT DETECTED.  The SARS-CoV-2 RNA is generally detectable in upper and lower respiratory specimens during the acute phase of infection. The lowest concentration of SARS-CoV-2 viral copies this assay can detect is 250 copies / mL. A negative result does not preclude SARS-CoV-2 infection and should not be used as the sole basis for treatment or other patient management decisions.  A negative result may occur with improper specimen collection / handling, submission of specimen other than nasopharyngeal swab, presence of viral mutation(s) within the areas targeted by this assay, and inadequate number of viral copies (<250 copies / mL). A negative result must be combined with clinical observations, patient history, and epidemiological information.  Fact Sheet for Patients:   BoilerBrush.com.cy  Fact Sheet for Healthcare Providers: https://pope.com/  This test is not yet approved or  cleared by the [place] FDA and has been authorized for  detection and/or diagnosis of SARS-CoV-2 by FDA under an Emergency Use Authorization (EUA).  This EUA will remain in effect (meaning this test can be used) for the duration of the COVID-19 declaration under Section 564(b)(1) of the Act, 21 U.S.C. section 360bbb-3(b)(1), unless the authorization is terminated or revoked sooner.  Performed at [facility], [address] [place], [place] [address]       Radiology Studies: CT Abdomen Pelvis W Contrast  Result Date: 10/17/2019 CLINICAL DATA:  Acute nonlocalized abdominal pain. Patient reports decreased appetite and weight loss. EXAM: CT ABDOMEN AND PELVIS WITH CONTRAST TECHNIQUE: Multidetector CT imaging of the abdomen and pelvis was performed using the standard protocol following bolus administration of intravenous contrast. CONTRAST:  OMNIPAQUE IOHEXOL 300 MG/ML  SOLN COMPARISON:  Abdominal ultrasound 05/24/2018.  CT 09/09/2015 FINDINGS: Lower chest: Elevated left hemidiaphragm. Left basilar consolidation likely compressive atelectasis. Hepatobiliary: Diffusely decreased hepatic density consistent with steatosis. Liver is enlarged spanning 24 cm cranial caudal. No evidence of focal hepatic lesion. Clips in the gallbladder fossa postcholecystectomy. No biliary dilatation. Pancreas: Edematous appearance of the pancreatic head and uncinate process. Low-density in the uncinate process measuring 10 mm suspicious for developing intrapancreatic pseudocyst. 17 mm hypodensity in the pancreatic tail likely pseudocyst. Peripancreatic fat stranding extending along the proximal body and to a lesser extent the tail. There multiple ill-defined peripherally enhancing fluid collections in the left upper quadrant. A crescentic fluid collection courses anteriorly, posteriorly, and anterior superior to the spleen. Superior component measures 6.2 x 2.9 x 2.4 cm under the left hemidiaphragm, series 2, image 9. Collection immediately anterior to the  spleen measures 7.1 x 5.1 x 5.8 cm, series 2 image 27. There is likely thin contiguous component to an ellipsoid fluid collection more anteriorly measuring 6 x 2.0 x 3.2 cm, series 2, image 27. Spleen: Perisplenic fluid collections as described. 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 No intrasplenic fluid collection. Upper normal spleen size. Adrenals/Urinary Tract: Normal adrenal glands. No hydronephrosis or perinephric edema. Homogeneous renal enhancement. Urinary bladder is nondistended. Stomach/Bowel: Stomach partially distended. Mild duodenal wall thickening/stranding likely reactive. There is no small bowel distention or obstruction. Scattered fluid-filled colon without colonic wall thickening or inflammation. Vascular/Lymphatic: Patent portal vein. 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 No adenopathy. Reproductive: Uterus and ovaries are unremarkable. Other: Small amount of upper abdominal ascites. Small to moderate volume of non organized free fluid in the pelvis, likely tracking from  the pericolic gutters. Fluid collection anterior to the left psoas muscle is diminished in size from 2017, currently measuring 4.1 x 2.6 cm, previously 6.6 x 3.8 cm. This is adjacent embolization coils and likely sequela of prior retroperitoneal hemorrhage. There is no free air. Musculoskeletal: There are no acute or suspicious osseous abnormalities. IMPRESSION: 1. Acute edematous appearance of the pancreatic head and uncinate process, suspicious for acute pancreatitis. Low-density in the uncinate process measuring 10 mm in 17 mm area of low-density in the pancreatic tail likely developing intrapancreatic pseudocysts. 2. Multiple ill-defined peripherally enhancing fluid collections in the left upper quadrant, largest measuring 7.1 x 5.1 x 5.8 cm. Suspect pseudocysts, new from 2017 exam. 3. 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 4. Small amount of ascites in the upper abdomen. Small to moderate volume of non organized free fluid in the pelvis, likely tracking from the pericolic gutters. 5. Hepatomegaly and hepatic steatosis. 6. Decreased size of fluid collection anterior to the left psoas muscle, currently measuring 4.1 x 2.6 cm, previously 6.6 x 3.8 cm. This is adjacent embolization coils and likely sequela of prior retroperitoneal hemorrhage. 7. Elevated left hemidiaphragm with left basilar consolidation likely compressive atelectasis. Electronically Signed   By: Narda Rutherford M.D.   On: 10/17/2019 00:19   MR ABDOMEN MRCP WO CONTRAST  Result Date: 10/17/2019 CLINICAL DATA:  Pancreatitis, pancreatic pseudocysts EXAM: MRI ABDOMEN WITHOUT CONTRAST  (INCLUDING MRCP) TECHNIQUE: Multiplanar multisequence MR imaging of the abdomen was performed. Heavily T2-weighted images of the biliary and pancreatic ducts were obtained, and three-dimensional MRCP images were rendered by post processing. COMPARISON:  CT abdomen pelvis, 10/16/2019 FINDINGS: Lower chest: No acute findings. Hepatobiliary: Hepatomegaly, maximum span 23.6 cm. Hepatic steatosis. No mass or other parenchymal abnormality identified. Status post cholecystectomy. No biliary ductal dilatation. Pancreas: Mild inflammatory stranding about the pancreas. The pancreatic duct is nondilated. There is an ill-defined area of fluid signal within the pancreatic head measuring approximately 1.3 cm (series 12, image 28). Spleen:  Mild splenomegaly, maximum span 13.3 cm. Adrenals/Urinary Tract: No masses identified. No evidence of hydronephrosis. Stomach/Bowel: Visualized portions within the abdomen are unremarkable. Vascular/Lymphatic: No pathologically enlarged lymph nodes identified. No abdominal aortic aneurysm demonstrated. Other: Trace perihepatic and perisplenic ascites. There are multiple, thick-walled loculated appearing fluid collections in the left upper quadrant,  posterior to the gastric body, superior to the spleen, anterior to the stomach, and the largest anterior to the spleen measuring 7.1 x 4.4 cm (series 12, image 21). Musculoskeletal: No suspicious bone lesions identified. IMPRESSION: 1. Mild inflammatory stranding about the pancreas, consistent with acute pancreatitis. 2. There is an ill-defined area of fluid signal within the pancreatic head measuring approximately 1.3 cm. This may represent a small developing acute pancreatic fluid collection. 3. 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 The presence or absence of infection cannot be established by imaging. 4. No pancreatic or biliary ductal dilatation. 5. Trace perihepatic and perisplenic ascites. 6. Mild splenomegaly. 7. Hepatomegaly and hepatic steatosis. 8. Status post cholecystectomy. Electronically Signed   By: Lauralyn Primes M.D.   On: 10/17/2019 08:40   MR 3D Recon At Scanner  Result Date: 10/17/2019 CLINICAL DATA:  Pancreatitis, pancreatic pseudocysts EXAM: MRI ABDOMEN WITHOUT CONTRAST  (INCLUDING MRCP) TECHNIQUE: Multiplanar multisequence MR imaging of the abdomen was performed. Heavily T2-weighted images of the biliary and pancreatic ducts were obtained, and three-dimensional MRCP images were rendered by post processing. COMPARISON:  CT abdomen pelvis, 10/16/2019 FINDINGS: Lower  chest: No acute findings. Hepatobiliary: Hepatomegaly, maximum span 23.6 cm. Hepatic steatosis. No mass or other parenchymal abnormality identified. Status post cholecystectomy. No biliary ductal dilatation. Pancreas: Mild inflammatory stranding about the pancreas. The pancreatic duct is nondilated. There is an ill-defined area of fluid signal within the pancreatic head measuring approximately 1.3 cm (series 12, image 28). Spleen:  Mild splenomegaly, maximum span 13.3 cm. Adrenals/Urinary Tract: No masses identified. No evidence of hydronephrosis.  Stomach/Bowel: Visualized portions within the abdomen are unremarkable. Vascular/Lymphatic: No pathologically enlarged lymph nodes identified. No abdominal aortic aneurysm demonstrated. Other: Trace perihepatic and perisplenic ascites. There are multiple, thick-walled loculated appearing fluid collections in the left upper quadrant, posterior to the gastric body, superior to the spleen, anterior to the stomach, and the largest anterior to the spleen measuring 7.1 x 4.4 cm (series 12, image 21). Musculoskeletal: No suspicious bone lesions identified. IMPRESSION: 1. Mild inflammatory stranding about the pancreas, consistent with acute pancreatitis. 2. There is an ill-defined area of fluid signal within the pancreatic head measuring approximately 1.3 cm. This may represent a small developing acute pancreatic fluid collection. 3. 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 The presence or absence of infection cannot be established by imaging. 4. No pancreatic or biliary ductal dilatation. 5. Trace perihepatic and perisplenic ascites. 6. Mild splenomegaly. 7. Hepatomegaly and hepatic steatosis. 8. Status post cholecystectomy. Electronically Signed   By: Lauralyn PrimesAlex  Bibbey M.D.   On: 10/17/2019 08:40     LOS: 1 day   Lanae Boastamesh Nain Rudd, MD [facility]  10/18/2019, 8:17 AM

## 2019-10-19 DIAGNOSIS — K8500 Idiopathic acute pancreatitis without necrosis or infection: Secondary | ICD-10-CM

## 2019-10-19 LAB — LIPASE, BLOOD: Lipase: 88 U/L — ABNORMAL HIGH (ref 11–51)

## 2019-10-19 LAB — COMPREHENSIVE METABOLIC PANEL
ALT: 17 U/L (ref 0–44)
AST: 51 U/L — ABNORMAL HIGH (ref 15–41)
Albumin: 2.5 g/dL — ABNORMAL LOW (ref 3.5–5.0)
Alkaline Phosphatase: 373 U/L — ABNORMAL HIGH (ref 38–126)
Anion gap: 13 (ref 5–15)
BUN: 6 mg/dL (ref 6–20)
CO2: 23 mmol/L (ref 22–32)
Calcium: 8.2 mg/dL — ABNORMAL LOW (ref 8.9–10.3)
Chloride: 100 mmol/L (ref 98–111)
Creatinine, Ser: 0.64 mg/dL (ref 0.44–1.00)
GFR calc Af Amer: >60 mL/min (ref >60)
GFR calc non Af Amer: >60 mL/min (ref >60)
Glucose, Bld: 113 mg/dL — ABNORMAL HIGH (ref 70–99)
Potassium: 3.5 mmol/L (ref 3.5–5.1)
Sodium: 136 mmol/L (ref 135–145)
Total Bilirubin: 1.3 mg/dL — ABNORMAL HIGH (ref 0.3–1.2)
Total Protein: 6.3 g/dL — ABNORMAL LOW (ref 6.5–8.1)

## 2019-10-19 LAB — PROTIME-INR
INR: 1.4 — ABNORMAL HIGH (ref 0.8–1.2)
Prothrombin Time: 17 seconds — ABNORMAL HIGH (ref 11.4–15.2)

## 2019-10-19 NOTE — Progress Notes (Signed)
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  Ongoing for about 2 months but worse for 2 days. In the emergency room, tachycardic, afebrile, WBC 13.5.  Renal functions normal.  Calcium normal.  MRI of the abdomen shows multiple uncomplicated pancreatic cyst and acute inflammation on the head of the pancreas.  Lipase is 128. Patient was admitted for further management.  Subjective: Tolerating clear liquid diet agrees for full liquid diet but would like to see how she does before we cut down on her IV Dilaudid today No acute complaint.  She was working on her laptop on my arrival.  Assessment & Plan:  Acute pancreatitis, recurrent, with multiple pancreatic, peripancreatic fluid collection, forming pseudocyst: Alcohol was felt to be the etiology but patient reports abstinence for 3 years, had lap chole in March 2020.  MRCP was done showed "Mild inflammatory stranding about the pancreas, consistent with acute pancreatitis. 2. There is an ill-defined area of fluid signal within the pancreatic head measuring approximately 1.3 cm. This may represent a small developing acute pancreatic fluid collection. 3. 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  Changed to full liquid diet, consult GI, was supposed to see GI end of July.  TG level 112, continue hydration po/iv  Leukocytosis: Likely from #1.  Resolved.  No fever.   Polyneuropathy-stable  Benign essential HTN: BP is controlled.  Continue metoprolol.  Lasix is stopped  GI.  History of asthma, severe persistent, well-controlled  Hypomagnesemia: Monitor and replete.  Sertraline, Seroquel and bupropion Home regimen continued  DVT prophylaxis: SCDs Start: 10/17/19 0608 Code Status: full Family Communication: plan of care discussed with patient at bedside.  Status is: Inpatient  Remains inpatient appropriate because:IV treatments appropriate due to intensity of illness or inability to take PO and Inpatient level of care appropriate due to severity of illness   Dispo: The patient is from: Home Home, independent.              Anticipated d/c is to: Home              Anticipated d/c date is: 2 days              Patient currently is not medically stable to d/c.  Planning for discharge home once able to take p.o. well and pain is controlled.  Nutrition: Diet Order            Diet clear liquid Room service appropriate? Yes; Fluid consistency: Thin  Diet effective now                 Body mass index is 42.26 kg/m.  Consultants:see note  Procedures:see note Microbiology:see note  Medications: Scheduled Meds: . buPROPion  150 mg Oral Daily  . buPROPion  300 mg Oral QHS  . enoxaparin (LOVENOX) injection  50 mg Subcutaneous Q24H  . fluticasone furoate-vilanterol  1 puff Inhalation Daily  . gabapentin  900 mg Oral BID  . melatonin  10 mg Oral QHS  . metoprolol succinate  50 mg Oral Daily  . QUEtiapine  100 mg Oral QHS  . sertraline  75 mg  Oral Daily  . umeclidinium bromide  1 puff Inhalation Daily   Continuous Infusions:   Antimicrobials: Anti-infectives (From admission, onward)   None       Objective: Vitals: Today's Vitals   10/19/19 1012 10/19/19 1140 10/19/19 1151 10/19/19 1251  BP:      Pulse:      Resp:      Temp:      TempSrc:      SpO2:  95%    Weight:      PainSc: 0-No pain  8  0-No pain    Intake/Output Summary (Last 24 hours) at 10/19/2019 1405 Last data filed at 10/19/2019 1053 Gross per 24 hour  Intake 472 ml   Output --  Net 472 ml   Filed Weights   10/18/19 0438 10/19/19 0500  Weight: 104.3 kg 115.2 kg   Weight change: 10.9 kg   Intake/Output from previous day: No intake/output data recorded. Intake/Output this shift: Total I/O In: 472 [P.O.:472] Out: -   Examination:  General exam: AAO , NAD, weak appearing. HEENT:Oral mucosa moist, Ear/Nose WNL grossly, dentition normal. Respiratory system: bilaterally clear no wheezing or crackles,no use of accessory muscle Cardiovascular system: S1 & S2 +, No JVD,. Gastrointestinal system: Abdomen soft, mildly tender in the midabdomen, obese, nondistended  Nervous System:Alert, awake, moving extremities and grossly nonfocal Extremities: No edema, distal peripheral pulses palpable.  Skin: No rashes,no icterus. MSK: Normal muscle bulk,tone, power  Data Reviewed: I have personally reviewed following labs and imaging studies CBC: Recent Labs  Lab 10/16/19 2000 10/17/19 0628 10/18/19 0316  WBC 13.5* 9.9 7.3  NEUTROABS  --  6.0 4.3  HGB 13.0 11.4* 10.5*  HCT 41.3 36.6 33.7*  MCV 90.8 92.9 92.1  PLT 434* 353 258   Basic Metabolic Panel: Recent Labs  Lab 10/16/19 2000 10/17/19 0628 10/18/19 0316 10/19/19 0323  NA 138 136 137 136  K 3.7 3.7 3.5 3.5  CL 100 102 103 100  CO2 26 24 24 23   GLUCOSE 129* 105* 85 113*  BUN 10 8 6 6   CREATININE 0.80 0.77 0.61 0.64  CALCIUM 9.1 7.9* 8.1* 8.2*  MG  --   --  1.6*  --   PHOS  --   --  3.3  --    GFR: Estimated Creatinine Clearance: 124.4 mL/min (by C-G formula based on SCr of 0.64 mg/dL). Liver Function Tests: Recent Labs  Lab 10/16/19 2000 10/17/19 0628 10/18/19 0316 10/19/19 0323  AST 21 82* 38 51*  ALT 13 20 17 17   ALKPHOS 196* 268* 244* 373*  BILITOT 0.9 1.0 1.1 1.3*  PROT 7.6 6.3* 5.7* 6.3*  ALBUMIN 3.3* 2.6* 2.4* 2.5*   Recent Labs  Lab 10/16/19 2000 10/18/19 0316 10/19/19 0323  LIPASE 128* 119* 88*   No results for input(s): AMMONIA in the last 168  hours. Coagulation Profile: Recent Labs  Lab 10/19/19 1309  INR 1.4*   Cardiac Enzymes: No results for input(s): CKTOTAL, CKMB, CKMBINDEX, TROPONINI in the last 168 hours. BNP (last 3 results) No results for input(s): PROBNP in the last 8760 hours. HbA1C: No results for input(s): HGBA1C in the last 72 hours. CBG: No results for input(s): GLUCAP in the last 168 hours. Lipid Profile: Recent Labs    10/17/19 0628  TRIG 112   Thyroid Function Tests: No results for input(s): TSH, T4TOTAL, FREET4, T3FREE, THYROIDAB in the last 72 hours. Anemia Panel: No results for input(s): VITAMINB12, FOLATE, FERRITIN, TIBC, IRON, RETICCTPCT in the last 72  hours. Sepsis Labs: Recent Labs  Lab 10/16/19 2313  LATICACIDVEN 1.0    Recent Results (from the past 240 hour(s))  SARS Coronavirus 2 by RT PCR (hospital order, performed in [facility] hospital lab) Nasopharyngeal Nasopharyngeal Swab     Status: None   Collection Time: 10/17/19  3:38 AM   Specimen: Nasopharyngeal Swab  Result Value Ref Range Status   SARS Coronavirus 2 NEGATIVE NEGATIVE Final    Comment: (NOTE) SARS-CoV-2 target nucleic acids are NOT DETECTED.  The SARS-CoV-2 RNA is generally detectable in upper and lower respiratory specimens during the acute phase of infection. The lowest concentration of SARS-CoV-2 viral copies this assay can detect is 250 copies / mL. A negative result does not preclude SARS-CoV-2 infection and should not be used as the sole basis for treatment or other patient management decisions.  A negative result may occur with improper specimen collection / handling, submission of specimen other than nasopharyngeal swab, presence of viral mutation(s) within the areas targeted by this assay, and inadequate number of viral copies (<250 copies / mL). A negative result must be combined with clinical observations, patient history, and epidemiological information.  Fact Sheet for Patients:    BoilerBrush.com.cy  Fact Sheet for Healthcare Providers: https://pope.com/  This test is not yet approved or  cleared by the [place] FDA and has been authorized for detection and/or diagnosis of SARS-CoV-2 by FDA under an Emergency Use Authorization (EUA).  This EUA will remain in effect (meaning this test can be used) for the duration of the COVID-19 declaration under Section 564(b)(1) of the Act, 21 U.S.C. section 360bbb-3(b)(1), unless the authorization is terminated or revoked sooner.  Performed at [facility], [address] [place], [place] [address]     Radiology Studies: No results found.   LOS: 2 days   Lanae Boast, MD [facility]  10/19/2019, 2:05 PM

## 2019-10-19 NOTE — Consult Note (Signed)
Referring Provider:  [facility]         Primary Care Physician:  Kara Cruel, MD Primary Gastroenterologist: Previously Kara [name] ( has an appt with [place] on 11/17/19)            We were asked to see this patient for:  Pancreatitis.               ASSESSMENT /  PLAN    Kara [name] is a 34 y.o. female PMH significant for, but not necessarily limited to, fatty liver,  HTN, asthma , obesity , diastolic heart failure, splenic vein thrombosis, prolonged QT, alcoholism ( remission), tobacco abuse, cholecystectomy   # Recurrent pancreatitis now with associated intrapancreatic, peripancreatic and perisplenic fluid collections.  --Fluid collections suspected to be pseudocyst.  Largest measuring 7.1 x 5.1 x 5.8 cm.  Also with abdominal ascites --Etiology for recurrent pancreatitis in absence of Etoh? She could have acute on chronic pancreatitis. Takes Lasix which has been linked to pancreatitis. Her gallbladder is out. Alk phos is up but no biliary duct dilation, bilirubin is normal.  --Still mildly tachycardic but better. WBC improved. HCT at 33%. Renal function normal. Continue conservative management for now. Clear liquids okay. If not able to tolerate food she may needs post-pyloric enteral feeding  --She will need follow up imaging at some point to reassess fluid collections. They may need to be drained at some point if she continues to have pain / fullness after resolution of pancreatitis. She has lost 15 pounds due to fullness leading to inability to eat.   # Hypoalbuminemia --May be reactive vrs malnutrition in setting of severely diminished intake over last couple of months.   # Hepatomegaly  / hepatic steatosis --No evidence for cirrhosis on imaging.  --ascites present on imaging but may be related to pancreatitis. --platelets are normal.  --Albumin is low but could be reactive and also explained by poor nutrition  --will obtain an INR    # Chronic SVT , collaterals present.                                                                                                                                 # History of alcohol abuse --In remission for 3 years  # ? History of RP hemorrhage --CT shows decreased size of fluid collection anterior to the left psoas muscle currently measuring 4.1 x 2.6 cm.  There are adjacent embolization calls, felt to be sequela of prior retroperitoneal hemorrhage --Patient recalls having an intraabdominal hemorrhage after receiving heparin during an admission within [facility] around year 2013 (she thinks).  He recalls an intervention being done.  I am unable to find records of this in epic   HPI:    Chief Complaint: Abdominal pain/recurrent pancreatitis  Kara [name] is a 34 y.o. female with pmh as listed above.  Patient has a history of recurrent pancreatitis.  She is scheduled to see [place] in  our office as a new patient 11/17/2018  October 2016- admitted with alcoholic pancreatitis, evaluated by [facility] at that time.  She has not seen [facility] since 2016    May 2017 admitted for recurrent pancreatitis.   March 2020 admitted for upper abdominal pain felt to be biliary colic.  Underwent cholecystectomy.  May 2020 admitted in [place] for a few days for pancreatitis.   No episodes of pancreatitis since May 2020.     INTERVAL HISTORY:    10/16/19 - presented to the ED for evaluation of upper abdominal pain and weight loss.  Symptoms present for approximately 2 months and different than usual pancreatitis.  She complains of twisting and fullness across upper abdomen.  She has been unable to eat for nearly 2 months, living off of liquids.  The discomfort is not exacerbated by p.o. intake but she feels too full to eat .  She reports a 15 pound weight loss recently .No fevers. No bowel changes.   In the past patient thought her recurrent pancreatitis was secondary to alcohol.  When she got recurrent episodes of pancreatitis in the absence of  drinking she then thought taking out her gallbladder would help.  However she was hospitalized in [place] after cholecystectomy with another episode of pancreatitis.   In the ED she was tachycardic, afebrile.  Covid test was negative.  White count elevated around 13.  Hemoglobin normal at 13, hematocrit 41.  Renal function was normal.  Alk phos elevated at 196, liver enzymes normal, total bilirubin normal.  Lipase elevated at 128.  PREVIOUS ENDOSCOPIC EVALUATIONS / GI STUDIES :  10/16/2019 CT scan  abd/ pelvis with contrast - IMPRESSION: 1. Acute edematous appearance of the pancreatic head and uncinate process, suspicious for acute pancreatitis. Low-density in the uncinate process measuring 10 mm in 17 mm area of low-density in the pancreatic tail likely developing intrapancreatic pseudocysts. 2. Multiple ill-defined peripherally enhancing fluid collections in the left upper quadrant, largest measuring 7.1 x 5.1 x 5.8 cm. Suspect pseudocysts, new from 2017 exam. 3. 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 4. Small amount of ascites in the upper abdomen. Small to moderate volume of non organized free fluid in the pelvis, likely tracking from the pericolic gutters. 5. Hepatomegaly and hepatic steatosis. 6. Decreased size of fluid collection anterior to the left psoas muscle, currently measuring 4.1 x 2.6 cm, previously 6.6 x 3.8 cm. This is adjacent embolization coils and likely sequela of prior retroperitoneal hemorrhage. 7. Elevated left hemidiaphragm with left basilar consolidation likely compressive atelectasis.    Past Medical History:  Diagnosis Date  . Alcoholism in remission ([place])    per pt in remission since 2017  . Anemia   . AR (allergic rhinitis)    allergist-- dr Kara [name]  . Biliary dyskinesia   . Diastolic CHF ([place])    followd by dr Kara [name]. Kara [name]  . Environmental allergies   . GERD (gastroesophageal reflux disease)   . Headache     . History of acute pancreatitis    alcoholic pancreatitis ,  [age] and 2017  . History of sepsis 06/12/2016   with CAP  . History of syncope   . 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 hematoma due to coumadin treated with IR embolization of the IMA branches  . Hypertension   . Polyneuropathy   . Prolonged QT syndrome    cardiologist-- dr Kara [name] (notes in epic)  . Severe persistent  asthma    pulmonologist-- dr dr Bernita Raisin (notes in epic)    Past Surgical History:  Procedure Laterality Date  . CHOLECYSTECTOMY N/A 06/21/2018   Procedure: LAPAROSCOPIC CHOLECYSTECTOMY;  Surgeon: Coralie Keens, MD;  Location: [facility];  Service: General;  Laterality: N/A;  . ESOPHAGOGASTRODUODENOSCOPY N/A 07/17/2014   Procedure: ESOPHAGOGASTRODUODENOSCOPY (EGD);  Surgeon: Teena Irani, MD;  Location: [facility];  Service: Endoscopy;  Laterality: N/A;  . TYMPANOSTOMY TUBE PLACEMENT Bilateral 11 MONTHS OLD  . VIDEO BRONCHOSCOPY Bilateral 10/08/2016   Procedure: VIDEO BRONCHOSCOPY WITH FLUORO;  Surgeon: Marshell Garfinkel, MD;  Location: [facility];  Service: Cardiopulmonary;  Laterality: Bilateral;    Prior to Admission medications   Medication Sig Start Date End Date Taking? Authorizing Provider  albuterol (PROVENTIL) (2.5 MG/3ML) 0.083% nebulizer solution Take 3 mLs (2.5 mg total) by nebulization every 4 (four) hours as needed for wheezing or shortness of breath. 05/03/19  Yes Ambs, Kathrine Cords, FNP  albuterol (VENTOLIN HFA) 108 (90 Base) MCG/ACT inhaler 2 puffs every 4 hours as needed for coughing or wheezing. 06/04/19  Yes Ambs, Kathrine Cords, FNP  BREO ELLIPTA 200-25 MCG/INH AEPB INHALE 1 PUFF INTO THE LUNGS DAILY 05/28/19  Yes Kozlow, Donnamarie Poag, MD  buPROPion [facility] SR) 150 MG 12 hr tablet Take 150-300 mg by mouth See admin instructions. Takes 1 tablet in the morning and 2 tablets at bedtime 04/10/19  Yes [provider]  chlorhexidine  (PERIDEX) 0.12 % solution Use as directed 15 mLs in the mouth or throat 2 (two) times daily. After meals Patient taking differently: Use as directed 15 mLs in the mouth or throat daily as needed (dental plaque). After meals  05/03/19  Yes Ambs, Kathrine Cords, FNP  Cholecalciferol (VITAMIN D PO) Take 1 tablet by mouth daily.    Yes [provider]  DUPIXENT 300 MG/2ML prefilled syringe INJECT 300 MG [place] Patient taking differently: Inject 300 mg into the skin every 14 (fourteen) days.  04/09/19  Yes Kozlow, Donnamarie Poag, MD  EPINEPHrine (EPIPEN 2-PAK) 0.3 mg/0.3 mL IJ SOAJ injection Inject 0.3 mLs (0.3 mg total) into the muscle once as needed (for severe allergic reaction). 07/11/18  Yes Kozlow, Donnamarie Poag, MD  furosemide (LASIX) 20 MG tablet Take 1 tablet (20 mg total) by mouth daily. Patient taking differently: Take 20 mg by mouth daily as needed for fluid.  06/11/19  Yes Josue Hector, MD  gabapentin (NEURONTIN) 300 MG capsule TAKE 3 CAPSULES BY MOUTH TWICE DAILY Patient taking differently: Take 900 mg by mouth 2 (two) times daily.  12/27/18  Yes Sater, Nanine Means, MD  Melatonin 10 MG TABS Take 10 mg by mouth at bedtime.   Yes [provider]  metoprolol succinate (TOPROL-XL) 50 MG 24 hr tablet Take 1 tablet (50 mg total) by mouth daily. 06/11/19  Yes Josue Hector, MD  montelukast (SINGULAIR) 10 MG tablet TAKE 1 TABLET(10 MG) BY MOUTH AT BEDTIME Patient taking differently: Take 10 mg by mouth at bedtime.  05/03/19  Yes Ambs, Kathrine Cords, FNP  Multiple Vitamins-Minerals (MULTIVITAMIN WITH MINERALS) tablet Take 1 tablet by mouth daily.   Yes [provider]  omeprazole (PRILOSEC) 40 MG capsule TAKE 1 CAPSULE(40 MG) BY MOUTH DAILY Patient taking differently: Take 40 mg by mouth daily.  05/28/19  Yes Ambs, Kathrine Cords, FNP  QUEtiapine (SEROQUEL) 100 MG tablet Take 100 mg by mouth at bedtime. 03/23/19  Yes [provider]  rosuvastatin (CRESTOR) 10 MG tablet Take 10 mg  by  mouth daily.   Yes [provider]  sertraline (ZOLOFT) 50 MG tablet Take 75 mg by mouth daily.   Yes [provider]  thiamine 100 MG tablet Take 100 mg by mouth daily.   Yes [provider]  umeclidinium bromide (INCRUSE ELLIPTA) 62.5 MCG/INH AEPB Inhale 1 puff into the lungs daily. 05/03/19  Yes Ambs, Norvel Richards, FNP    Current Facility-Administered Medications  Medication Dose Route Frequency Provider Last Rate Last Admin  . albuterol (PROVENTIL) (2.5 MG/3ML) 0.083% nebulizer solution 2.5 mg  2.5 mg Nebulization Q4H PRN Eduard Clos, MD      . buPROPion [facility] SR) 12 hr tablet 150 mg  150 mg Oral Daily Dorcas Carrow, MD   150 mg at 10/19/19 0912  . buPROPion (WELLBUTRIN SR) 12 hr tablet 300 mg  300 mg Oral QHS Dorcas Carrow, MD   300 mg at 10/18/19 2108  . enoxaparin (LOVENOX) injection 50 mg  50 mg Subcutaneous Q24H Kc, Ramesh, MD   50 mg at 10/18/19 0840  . fluticasone furoate-vilanterol (BREO ELLIPTA) 200-25 MCG/INH 1 puff  1 puff Inhalation Daily Eduard Clos, MD   1 puff at 10/18/19 0841  . gabapentin (NEURONTIN) capsule 900 mg  900 mg Oral BID Dorcas Carrow, MD   900 mg at 10/19/19 0912  . hydrALAZINE (APRESOLINE) injection 10 mg  10 mg Intravenous Q4H PRN Eduard Clos, MD      . HYDROmorphone (DILAUDID) injection 1 mg  1 mg Intravenous Q2H PRN Eduard Clos, MD   1 mg at 10/19/19 0912  . melatonin tablet 10 mg  10 mg Oral QHS Dorcas Carrow, MD   10 mg at 10/18/19 2104  . metoprolol succinate (TOPROL-XL) 24 hr tablet 50 mg  50 mg Oral Daily Dorcas Carrow, MD   50 mg at 10/19/19 0912  . prochlorperazine (COMPAZINE) injection 10 mg  10 mg Intravenous Q4H PRN Marikay Alar, FNP   10 mg at 10/17/19 2123  . QUEtiapine (SEROQUEL) tablet 100 mg  100 mg Oral QHS Dorcas Carrow, MD   100 mg at 10/18/19 2103  . sertraline (ZOLOFT) tablet 75 mg  75 mg Oral Daily Dorcas Carrow, MD   75 mg at 10/19/19 0912  . umeclidinium bromide  (INCRUSE ELLIPTA) 62.5 MCG/INH 1 puff  1 puff Inhalation Daily Eduard Clos, MD   1 puff at 10/18/19 5537    Allergies as of 10/16/2019 - Review Complete 10/16/2019  Allergen Reaction Noted  . Nsaids Other (See Comments) 06/13/2018  . Peanuts [peanut oil] Hives and Itching 07/06/2014    Family History  Problem Relation Age of Onset  . Heart failure Mother   . Heart failure Father   . Diabetes Father   . Heart failure Maternal Grandfather     Social History   Socioeconomic History  . Marital status: Married    Spouse name: [name]  . Number of children: 0  . Years of education: Not on file  . Highest education level: Not on file  Occupational History  . Occupation: Engineer, production  Tobacco Use  . Smoking status: Current Every Day Smoker    Packs/day: 0.50    Years: 5.00    Pack years: 2.50    Types: Cigarettes  . Smokeless tobacco: Never Used  Vaping Use  . Vaping Use: Never used  Substance and Sexual Activity  . Alcohol use: Not Currently    Alcohol/week: 0.0 standard drinks    Comment: pt hx  alcohol abuse , in remission since 2017  . Drug use: Not Currently    Comment: "POT WHEN I WAS IN HIGH SCHOOL"  . Sexual activity: Yes    Birth control/protection: None  Other Topics Concern  . Not on file  Social History Narrative   Married.   Social Determinants of Health   Financial Resource Strain:   . Difficulty of Paying Living Expenses:   Food Insecurity:   . Worried About Charity fundraiser in the Last Year:   . Arboriculturist in the Last Year:   Transportation Needs:   . Film/video editor (Medical):   Marland Kitchen Lack of Transportation (Non-Medical):   Physical Activity:   . Days of Exercise per Week:   . Minutes of Exercise per Session:   Stress:   . Feeling of Stress :   Social Connections:   . Frequency of Communication with Friends and Family:   . Frequency of Social Gatherings with Friends and Family:   . Attends Religious Services:   .  Active Member of Clubs or Organizations:   . Attends Archivist Meetings:   Marland Kitchen Marital Status:   Intimate Partner Violence:   . Fear of Current or Ex-Partner:   . Emotionally Abused:   Marland Kitchen Physically Abused:   . Sexually Abused:     Review of Systems: All systems reviewed and negative except where noted in HPI.  Physical Exam: Vital signs in last 24 hours: Temp:  [98.3 F (36.8 C)-98.8 F (37.1 C)] 98.8 F (37.1 C) (07/02 0521) Pulse Rate:  [95-107] 107 (07/02 0521) Resp:  [16] 16 (07/02 0521) BP: (112-120)/(76-87) 112/76 (07/02 0521) SpO2:  [93 %-97 %] 93 % (07/02 0521) Weight:  [397.6 kg] 115.2 kg (07/02 0500) Last BM Date: 10/19/19 General:   Alert, well-developed,  female in NAD Psych:  Pleasant, cooperative. Normal mood and affect. Eyes:  Pupils equal, sclera clear, no icterus.   Conjunctiva pink. Ears:  Normal auditory acuity. Nose:  No deformity, discharge,  or lesions. Neck:  Supple; no masses Lungs:  Clear throughout to auscultation.   No wheezes, crackles, or rhonchi.  Heart:  Regular rate and rhythm; no murmurs, no lower extremity edema Abdomen:  Soft, non-distended, mild to moderate diffuse upper abdominal tenderness. BS active, no palp mass   Rectal:  Deferred  Msk:  Symmetrical without gross deformities. . Neurologic:  Alert and  oriented x4;  grossly normal neurologically. Skin:  Intact without significant lesions or rashes.   Intake/Output from previous day: No intake/output data recorded. Intake/Output this shift: Total I/O In: 472 [P.O.:472] Out: -   Lab Results: Recent Labs    10/16/19 2000 10/17/19 0628 10/18/19 0316  WBC 13.5* 9.9 7.3  HGB 13.0 11.4* 10.5*  HCT 41.3 36.6 33.7*  PLT 434* 353 258   BMET Recent Labs    10/17/19 0628 10/18/19 0316 10/19/19 0323  NA 136 137 136  K 3.7 3.5 3.5  CL 102 103 100  CO2 '24 24 23  '$ GLUCOSE 105* 85 113*  BUN '8 6 6  '$ CREATININE 0.77 0.61 0.64  CALCIUM 7.9* 8.1* 8.2*   LFT Recent  Labs    10/17/19 0628 10/18/19 0316 10/19/19 0323  PROT 6.3*   < > 6.3*  ALBUMIN 2.6*   < > 2.5*  AST 82*   < > 51*  ALT 20   < > 17  ALKPHOS 268*   < > 373*  BILITOT 1.0   < >  1.3*  BILIDIR 0.2  --   --   IBILI 0.8  --   --    < > = values in this interval not displayed.   PT/INR No results for input(s): LABPROT, INR in the last 72 hours. Hepatitis Panel No results for input(s): HEPBSAG, HCVAB, HEPAIGM, HEPBIGM in the last 72 hours.   . CBC Latest Ref Rng & Units 10/18/2019 10/17/2019 10/16/2019  WBC 4.0 - 10.5 K/uL 7.3 9.9 13.5(H)  Hemoglobin 12.0 - 15.0 g/dL 10.5(L) 11.4(L) 13.0  Hematocrit 36 - 46 % 33.7(L) 36.6 41.3  Platelets 150 - 400 K/uL 258 353 434(H)    . CMP Latest Ref Rng & Units 10/19/2019 10/18/2019 10/17/2019  Glucose 70 - 99 mg/dL 113(H) 85 105(H)  BUN 6 - 20 mg/dL '6 6 8  '$ Creatinine 0.44 - 1.00 mg/dL 0.64 0.61 0.77  Sodium 135 - 145 mmol/L 136 137 136  Potassium 3.5 - 5.1 mmol/L 3.5 3.5 3.7  Chloride 98 - 111 mmol/L 100 103 102  CO2 22 - 32 mmol/L '23 24 24  '$ Calcium 8.9 - 10.3 mg/dL 8.2(L) 8.1(L) 7.9(L)  Total Protein 6.5 - 8.1 g/dL 6.3(L) 5.7(L) 6.3(L)  Total Bilirubin 0.3 - 1.2 mg/dL 1.3(H) 1.1 1.0  Alkaline Phos 38 - 126 U/L 373(H) 244(H) 268(H)  AST 15 - 41 U/L 51(H) 38 82(H)  ALT 0 - 44 U/L '17 17 20   '$ Studies/Results: No results found.  Principal Problem:   Acute pancreatitis Active Problems:   Polyneuropathy   Benign essential HTN   Asthma, severe persistent, well-controlled    [name], NP-C @  10/19/2019, 11:32 AM

## 2019-10-19 NOTE — Progress Notes (Signed)
Informed MD that pt had no PO PRN pain med ordered.  MD is aware.

## 2019-10-20 DIAGNOSIS — K859 Acute pancreatitis without necrosis or infection, unspecified: Principal | ICD-10-CM

## 2019-10-20 DIAGNOSIS — K863 Pseudocyst of pancreas: Secondary | ICD-10-CM

## 2019-10-20 DIAGNOSIS — K862 Cyst of pancreas: Secondary | ICD-10-CM

## 2019-10-20 DIAGNOSIS — K760 Fatty (change of) liver, not elsewhere classified: Secondary | ICD-10-CM

## 2019-10-20 LAB — COMPREHENSIVE METABOLIC PANEL
ALT: 13 U/L (ref 0–44)
AST: 25 U/L (ref 15–41)
Albumin: 2.4 g/dL — ABNORMAL LOW (ref 3.5–5.0)
Alkaline Phosphatase: 327 U/L — ABNORMAL HIGH (ref 38–126)
Anion gap: 8 (ref 5–15)
BUN: <5 mg/dL — ABNORMAL LOW (ref 6–20)
CO2: 28 mmol/L (ref 22–32)
Calcium: 8.2 mg/dL — ABNORMAL LOW (ref 8.9–10.3)
Chloride: 101 mmol/L (ref 98–111)
Creatinine, Ser: 0.55 mg/dL (ref 0.44–1.00)
GFR calc Af Amer: >60 mL/min (ref >60)
GFR calc non Af Amer: >60 mL/min (ref >60)
Glucose, Bld: 90 mg/dL (ref 70–99)
Potassium: 3.2 mmol/L — ABNORMAL LOW (ref 3.5–5.1)
Sodium: 137 mmol/L (ref 135–145)
Total Bilirubin: 1 mg/dL (ref 0.3–1.2)
Total Protein: 6 g/dL — ABNORMAL LOW (ref 6.5–8.1)

## 2019-10-20 LAB — LIPASE, BLOOD: Lipase: 68 U/L — ABNORMAL HIGH (ref 11–51)

## 2019-10-20 LAB — MAGNESIUM: Magnesium: 1.9 mg/dL (ref 1.7–2.4)

## 2019-10-20 MED ORDER — POTASSIUM CHLORIDE CRYS ER 20 MEQ PO TBCR
40.0000 meq | EXTENDED_RELEASE_TABLET | Freq: Once | ORAL | Status: AC
  Administered 2019-10-20: 40 meq via ORAL
  Filled 2019-10-20: qty 2

## 2019-10-20 MED ORDER — ENOXAPARIN SODIUM 60 MG/0.6ML ~~LOC~~ SOLN
60.0000 mg | SUBCUTANEOUS | Status: DC
  Administered 2019-10-21 – 2019-10-31 (×11): 60 mg via SUBCUTANEOUS
  Filled 2019-10-20 (×12): qty 0.6

## 2019-10-20 NOTE — Progress Notes (Signed)
PROGRESS NOTE    Kara [name]  SJG:[id number] DOB: 03-31-1986 DOA: 10/16/2019 PCP: Lawerance Cruel, MD   Chef Complaints: Abdominal pain  Brief Narrative: 34 year old with history of hypertension, asthma, previously alcoholic who quit for 3 years and history of alcohol induced pancreatitis, cholecystectomy after an episode of pancreatitis last year, last exacerbation in May 2020 and none since then presents with worsening abdominal pain.  Ongoing for about 2 months but worse for 2 days. In the emergency room, tachycardic, afebrile, WBC 13.5.  Renal functions normal.  Calcium normal.  MRI of the abdomen shows multiple uncomplicated pancreatic cyst and acute inflammation on the head of the pancreas.  Lipase is 128. Patient was admitted for further management.  Subjective:  Tolerating clear, complains of fullness and some pain.   No nausea vomiting. No bowel movement today.  Assessment & Plan:  Acute pancreatitis, recurrent, with multiple pancreatic, peripancreatic fluid collection, forming pseudocyst the largest measuring 7.1 X5.1X 5.8 cm: Alcohol was felt to be the etiology and previous pancreatitis but patient reports abstinence for 3 years, had lap chole in March 2020.  MRCP done here, refer to the report.  GI consulted 7/2 appreciate input advancing diet to full liquid diet, patient was to continue IV pain medication for now hopefully can introduce p.o. pain meds.  Will need follow-up imaging in few weeks to reassess fluid collection, may need to be drained at some point if she continues to have pain/fullness after resolution of pancreatitis.  Lasix is stopped as a potential etiology.  Lipase down to 68, AST ALT stable, alk phos elevated 327.  Hypoalbuminemia in the setting of pancreatitis.  Encourage nutrition.  Hepatomegaly/hepatic steatosis no evidence of cirrhosis.  Follow-up with GI.  Weight loss will be beneficial.  Leukocytosis: Likely from #1.  Resolved.      Polyneuropathy-stable  Benign essential HTN: Blood pressure is controlled. Continue metoprolol.  Lasix is stopped GI.  History of asthma, severe persistent, well-controlled.  On room air.  No wheezing.  Hypomagnesemia: Magnesium improved to 1.9. Hypokalemia will replete with 40 meq KCl  Sertraline, Seroquel and bupropion Home regimen continued  Morbid obesity with BMI 44, will benefit with weight loss and healthy lifestyle.  DVT prophylaxis: SCDs Start: 10/17/19 0608 Code Status: full Family Communication: plan of care discussed with patient at bedside.  Status is: Inpatient  Remains inpatient appropriate because:IV treatments appropriate due to intensity of illness or inability to take PO and Inpatient level of care appropriate due to severity of illness   Dispo: The patient is from: [place], independent.              Anticipated d/c is to: Home              Anticipated d/c date is: 2 days              Patient currently is not medically stable to d/c.  Planning for discharge home once able to take p.o. well and pain is controlled.  Nutrition: Diet Order            Diet full liquid Room service appropriate? Yes; Fluid consistency: Thin  Diet effective now                 Body mass index is 44.32 kg/m.  Consultants: Gastroenterology Procedures:see note Microbiology:see note  Medications: Scheduled Meds: . buPROPion  150 mg Oral Daily  . buPROPion  300 mg Oral QHS  . [START ON 10/21/2019] enoxaparin (LOVENOX) injection  60  mg Subcutaneous Q24H  . fluticasone furoate-vilanterol  1 puff Inhalation Daily  . gabapentin  900 mg Oral BID  . melatonin  10 mg Oral QHS  . metoprolol succinate  50 mg Oral Daily  . potassium chloride  40 mEq Oral Once  . QUEtiapine  100 mg Oral QHS  . sertraline  75 mg Oral Daily  . umeclidinium bromide  1 puff Inhalation Daily   Continuous Infusions:   Antimicrobials: Anti-infectives (From admission, onward)   None        Objective: Vitals: Today's Vitals   10/20/19 0541 10/20/19 0750 10/20/19 0832 10/20/19 0902  BP:      Pulse:  91    Resp:  18    Temp:      TempSrc:      SpO2:  93%    Weight:      PainSc: Asleep  8  Asleep    Intake/Output Summary (Last 24 hours) at 10/20/2019 1119 Last data filed at 10/20/2019 1002 Gross per 24 hour  Intake 1314 ml  Output --  Net 1314 ml   Filed Weights   10/18/19 0438 10/19/19 0500 10/20/19 0248  Weight: 104.3 kg 115.2 kg 120.8 kg   Weight change: 5.6 kg   Intake/Output from previous day: 07/02 0701 - 07/03 0700 In: 826 [P.O.:826] Out: -  Intake/Output this shift: Total I/O In: 960 [P.O.:960] Out: -   Examination:  General exam: AAO , NAD, weak appearing. HEENT:Oral mucosa moist, Ear/Nose WNL grossly, dentition normal. Respiratory system: bilaterally clear,no wheezing or crackles,no use of accessory muscle Cardiovascular system: S1 & S2 +, No JVD,. Gastrointestinal system: Abdomen soft, obese, mild epigastric tenderness, bowel sounds present  Nervous System:Alert, awake, moving extremities and grossly nonfocal Extremities: No edema, distal peripheral pulses palpable.  Skin: No rashes,no icterus. MSK: Normal muscle bulk,tone, power  Data Reviewed: I have personally reviewed following labs and imaging studies CBC: Recent Labs  Lab 10/16/19 2000 10/17/19 0628 10/18/19 0316  WBC 13.5* 9.9 7.3  NEUTROABS  --  6.0 4.3  HGB 13.0 11.4* 10.5*  HCT 41.3 36.6 33.7*  MCV 90.8 92.9 92.1  PLT 434* 353 433   Basic Metabolic Panel: Recent Labs  Lab 10/16/19 2000 10/17/19 0628 10/18/19 0316 10/19/19 0323 10/20/19 0324  NA 138 136 137 136 137  K 3.7 3.7 3.5 3.5 3.2*  CL 100 102 103 100 101  CO2 _0 GLUCOSE 129* 105* 85 113* 90  BUN _1 <5*  CREATININE 0.80 0.77 0.61 0.64 0.55  CALCIUM 9.1 7.9* 8.1* 8.2* 8.2*  MG  --   --  1.6*  --  1.9  PHOS  --   --  3.3  --   --    GFR: Estimated Creatinine Clearance: 127.8  mL/min (by C-G formula based on SCr of 0.55 mg/dL). Liver Function Tests: Recent Labs  Lab 10/16/19 2000 10/17/19 0628 10/18/19 0316 10/19/19 0323 10/20/19 0324  AST 21 82* 38 51* 25  ALT _2 ALKPHOS 196* 268* 244* 373* 327*  BILITOT 0.9 1.0 1.1 1.3* 1.0  PROT 7.6 6.3* 5.7* 6.3* 6.0*  ALBUMIN 3.3* 2.6* 2.4* 2.5* 2.4*   Recent Labs  Lab 10/16/19 2000 10/18/19 0316 10/19/19 0323 10/20/19 0324  LIPASE 128* 119* 88* 68*   No results for input(s): AMMONIA in the last 168 hours. Coagulation Profile: Recent Labs  Lab 10/19/19 1309  INR 1.4*   Cardiac Enzymes: No results for  input(s): CKTOTAL, CKMB, CKMBINDEX, TROPONINI in the last 168 hours. BNP (last 3 results) No results for input(s): PROBNP in the last 8760 hours. HbA1C: No results for input(s): HGBA1C in the last 72 hours. CBG: No results for input(s): GLUCAP in the last 168 hours. Lipid Profile: No results for input(s): CHOL, HDL, LDLCALC, TRIG, CHOLHDL, LDLDIRECT in the last 72 hours. Thyroid Function Tests: No results for input(s): TSH, T4TOTAL, FREET4, T3FREE, THYROIDAB in the last 72 hours. Anemia Panel: No results for input(s): VITAMINB12, FOLATE, FERRITIN, TIBC, IRON, RETICCTPCT in the last 72 hours. Sepsis Labs: Recent Labs  Lab 10/16/19 2313  LATICACIDVEN 1.0    Recent Results (from the past 240 hour(s))  SARS Coronavirus 2 by RT PCR (hospital order, performed in [facility] hospital lab) Nasopharyngeal Nasopharyngeal Swab     Status: None   Collection Time: 10/17/19  3:38 AM   Specimen: Nasopharyngeal Swab  Result Value Ref Range Status   SARS Coronavirus 2 NEGATIVE NEGATIVE Final    Comment: (NOTE) SARS-CoV-2 target nucleic acids are NOT DETECTED.  The SARS-CoV-2 RNA is generally detectable in upper and lower respiratory specimens during the acute phase of infection. The lowest concentration of SARS-CoV-2 viral copies this assay can detect is 250 copies / mL. A negative result does  not preclude SARS-CoV-2 infection and should not be used as the sole basis for treatment or other patient management decisions.  A negative result may occur with improper specimen collection / handling, submission of specimen other than nasopharyngeal swab, presence of viral mutation(s) within the areas targeted by this assay, and inadequate number of viral copies (<250 copies / mL). A negative result must be combined with clinical observations, patient history, and epidemiological information.  Fact Sheet for Patients:   StrictlyIdeas.no  Fact Sheet for Healthcare Providers: BankingDealers.co.za  This test is not yet approved or  cleared by the [place] FDA and has been authorized for detection and/or diagnosis of SARS-CoV-2 by FDA under an Emergency Use Authorization (EUA).  This EUA will remain in effect (meaning this test can be used) for the duration of the COVID-19 declaration under Section 564(b)(1) of the Act, 21 U.S.C. section 360bbb-3(b)(1), unless the authorization is terminated or revoked sooner.  Performed at [facility], [place] [address], [place], [place] [address]     Radiology Studies: No results found.   LOS: 3 days   Antonieta Pert, MD [facility]  10/20/2019, 11:19 AM

## 2019-10-20 NOTE — Progress Notes (Signed)
Progress Note  CC:    Pancreatitis.       ASSESSMENT AND PLAN:   Kara [name] is a 34 y.o. female PMH significant for, but not necessarily limited to, fatty liver,  HTN, asthma , obesity , diastolic heart failure, splenic vein thrombosis, prolonged QT, alcoholism ( remission), tobacco abuse, cholecystectomy   # Recurrent pancreatitis now with multiple peripancreatic , forming pseudocysts, largest measuring 7.1 x 5.1 x 5.8 cm.  --Tolerated clears, want to try diet advancement. Will try full liquids.   If unable to tolerate she may needs post-pyloric tubel feeding  --She will need follow up imaging in a few weeks to reassess fluid collections. They may need to be drained at some point if she continues to have pain / fullness after resolution of pancreatitis. She has lost 15 pounds due to fullness leading to inability to eat.  --Etiology for recurrent pancreatitis in absence of Etoh? She could have acute on chronic pancreatitis. Takes Lasix at home which has been linked to pancreatitis. Her gallbladder is out. Alk phos is up but no biliary duct dilation, bilirubin is normal. Consider screening for genetic causes.  Continue conservative management for now.   # Hypoalbuminemia --May be reactive vrs malnutrition in setting of severely diminished intake over last couple of months.   # Hepatomegaly  / hepatic steatosis --No evidence for cirrhosis on imaging.  --mild ascites present on imaging but may be related to pancreatitis. --platelets are normal. Minor elevation in INR to 1.4 ( possibly nutritional) --Albumin is low but could be reactive and also explained by poor nutrition  --Long term she will need to lose weight to help with management of steatosis.   # Chronic SVT , collaterals present.                                                                                                                                # History of alcohol abuse --In remission for 3 years  # RLE  edema --no associated erythema nor pain to suggest.  --will defer workup to [facility],      SUBJECTIVE   Feels better today, tolerating clears. Would like to try diet advancement    OBJECTIVE:     Vital signs in last 24 hours: Temp:  [98.2 F (36.8 C)-98.4 F (36.9 C)] 98.4 F (36.9 C) (07/03 0152) Pulse Rate:  [91-98] 91 (07/03 0750) Resp:  [16-20] 18 (07/03 0750) BP: (95-120)/(65-80) 95/65 (07/03 0152) SpO2:  [93 %-95 %] 93 % (07/03 0750) Weight:  [120.8 kg] 120.8 kg (07/03 0248) Last BM Date: 10/19/19 General:   Alert, in NAD Heart:  Regular rate and rhythm, RLE edema.    Pulm: Normal respiratory effort   Abdomen:  Soft,  nontender, nondistended.  Normal bowel sounds.          Neurologic:  Alert and  oriented,  grossly normal neurologically. Psych:  Pleasant, cooperative.  Normal mood  and affect.   Intake/Output from previous day: 07/02 0701 - 07/03 0700 In: 826 [P.O.:826] Out: -  Intake/Output this shift: No intake/output data recorded.  Lab Results: Recent Labs    10/18/19 0316  WBC 7.3  HGB 10.5*  HCT 33.7*  PLT 258   BMET Recent Labs    10/18/19 0316 10/19/19 0323 10/20/19 0324  NA 137 136 137  K 3.5 3.5 3.2*  CL 103 100 101  CO2 '24 23 28  '$ GLUCOSE 85 113* 90  BUN 6 6 <5*  CREATININE 0.61 0.64 0.55  CALCIUM 8.1* 8.2* 8.2*   LFT Recent Labs    10/20/19 0324  PROT 6.0*  ALBUMIN 2.4*  AST 25  ALT 13  ALKPHOS 327*  BILITOT 1.0   PT/INR Recent Labs    10/19/19 1309  LABPROT 17.0*  INR 1.4*     Principal Problem:   Acute pancreatitis Active Problems:   Polyneuropathy   Benign essential HTN   Asthma, severe persistent, well-controlled     LOS: 3 days   [name] ,NP 10/20/2019, 8:52 AM

## 2019-10-21 LAB — LIPASE, BLOOD: Lipase: 97 U/L — ABNORMAL HIGH (ref 11–51)

## 2019-10-21 MED ORDER — OXYCODONE HCL 5 MG PO TABS
5.0000 mg | ORAL_TABLET | ORAL | Status: DC | PRN
  Administered 2019-10-21 – 2019-10-27 (×7): 5 mg via ORAL
  Filled 2019-10-21 (×9): qty 1

## 2019-10-21 NOTE — Progress Notes (Signed)
PROGRESS NOTE    Kara [name]  OMV:[id number] DOB: [birth year] DOA: 10/16/2019 PCP: Lawerance Cruel, MD   Chef Complaints: Abdominal pain  Brief Narrative: 34 year old with history of hypertension, asthma, previously alcoholic who quit for 3 years and history of alcohol induced pancreatitis, cholecystectomy after an episode of pancreatitis last year, last exacerbation in May 2020 and none since then presents with worsening abdominal pain.  Ongoing for about 2 months but worse for 2 days. In the emergency room, tachycardic, afebrile, WBC 13.5.  Renal functions normal.  Calcium normal.  MRI of the abdomen shows multiple uncomplicated pancreatic cyst and acute inflammation on the head of the pancreas.  Lipase is 128. Patient was admitted for further management.  Subjective:  Did not tolerate full liquid diet yesterday had nausea and abdominal fullness, scale back to liquid diet.  Trying to eat grit this morning. No nausea vomiting today. Denies chest pain fever chills. Assessment & Plan:  Acute pancreatitis, recurrent, with multiple pancreatic, peripancreatic fluid collection, forming pseudocyst the largest measuring 7.1 X5.1X 5.8 cm: Alcohol was felt to be the etiology and previous pancreatitis but patient reports abstinence for 3 years, had lap chole in March 2020.  MRCP done here, refer to the report.  GI consult 7/2, appreciate input, Lasix discontinued as it could be contributing, further testing per GI.  Did not tolerate clear liquid diet and is scheduled back to clear liquid diet overnight.  Trying full liquid diet this morning.  Continues to require IV pain medication for pain control.  Reports she is tolerating fluids orally, off IV fluids.  If still unable to tolerate diet well will need to put her back on IV fluids.  LFTs stable with elevated alk phos, elevated lipase 60s to 90s.  Hypoalbuminemia in the setting of pancreatitis augment nutrition as tolerated.  Hepatomegaly/hepatic  steatosis no evidence of cirrhosis.  Will need to follow-up with GI, will need weight loss.   Leukocytosis: Likely from #1.  Resolved.  Polyneuropathy-stable  Benign essential HTN: BP is controlled continue metoprolol.  Lasix discontinued due to pancreatitis.  History of asthma, severe persistent, well-controlled.  On room air.  No wheezing.  Hypomagnesemia: Magnesium improved to 1.9. Hypokalemia was repleted.  Repeat labs in a.m.    Sertraline, Seroquel and bupropion Home regimen continued  Morbid obesity with BMI 44, will benefit with weight loss and healthy lifestyle.  DVT prophylaxis: SCDs Start: 10/17/19 0608 Code Status: full Family Communication: plan of care discussed with patient at bedside.  Status is: Inpatient  Remains inpatient appropriate because:IV treatments appropriate due to intensity of illness or inability to take PO and Inpatient level of care appropriate due to severity of illness   Dispo: The patient is from: [place], independent.              Anticipated d/c is to: Home              Anticipated d/c date is: 2 days              Patient currently is not medically stable to d/c.  Planning for discharge home once able to take p.o. well and pain is controlled now having difficulty with advancing diet.  Nutrition: Diet Order            Diet full liquid Room service appropriate? Yes; Fluid consistency: Thin  Diet effective now                 Body mass index is 44.13 kg/m.  Consultants: Gastroenterology Procedures:see note Microbiology:see note  Medications: Scheduled Meds: . buPROPion  150 mg Oral Daily  . buPROPion  300 mg Oral QHS  . enoxaparin (LOVENOX) injection  60 mg Subcutaneous Q24H  . fluticasone furoate-vilanterol  1 puff Inhalation Daily  . gabapentin  900 mg Oral BID  . melatonin  10 mg Oral QHS  . metoprolol succinate  50 mg Oral Daily  . QUEtiapine  100 mg Oral QHS  . sertraline  75 mg Oral Daily  . umeclidinium bromide  1  puff Inhalation Daily   Continuous Infusions:   Antimicrobials: Anti-infectives (From admission, onward)   None       Objective: Vitals: Today's Vitals   10/21/19 0845 10/21/19 0847 10/21/19 0925 10/21/19 0955  BP:      Pulse:      Resp:      Temp:      TempSrc:      SpO2: 96% 96%    Weight:      Height:      PainSc:   7  4     Intake/Output Summary (Last 24 hours) at 10/21/2019 1300 Last data filed at 10/21/2019 0925 Gross per 24 hour  Intake 960 ml  Output --  Net 960 ml   Filed Weights   10/20/19 0248 10/21/19 0500 10/21/19 0832  Weight: 120.8 kg 120.3 kg 120.3 kg   Weight change: -0.5 kg   Intake/Output from previous day: 07/03 0701 - 07/04 0700 In: 1920 [P.O.:1920] Out: -  Intake/Output this shift: Total I/O In: 480 [P.O.:480] Out: -   Examination:  General exam: AAO X3, obese, NAD, weak appearing. HEENT:Oral mucosa moist, Ear/Nose WNL grossly, dentition normal. Respiratory system: bilaterally diminished,no wheezing or crackles,no use of accessory muscle Cardiovascular system: S1 & S2 +, No JVD,. Gastrointestinal system: Abdomen soft, obese, mildly tender in the mid abdomen,ND, BS+ Nervous System:Alert, awake, moving extremities and grossly nonfocal Extremities: No edema, distal peripheral pulses palpable.  Skin: No rashes,no icterus. MSK: Normal muscle bulk,tone, power  Data Reviewed: I have personally reviewed following labs and imaging studies CBC: Recent Labs  Lab 10/16/19 2000 10/17/19 0628 10/18/19 0316  WBC 13.5* 9.9 7.3  NEUTROABS  --  6.0 4.3  HGB 13.0 11.4* 10.5*  HCT 41.3 36.6 33.7*  MCV 90.8 92.9 92.1  PLT 434* 353 127   Basic Metabolic Panel: Recent Labs  Lab 10/16/19 2000 10/17/19 0628 10/18/19 0316 10/19/19 0323 10/20/19 0324  NA 138 136 137 136 137  K 3.7 3.7 3.5 3.5 3.2*  CL 100 102 103 100 101  CO2 _0 GLUCOSE 129* 105* 85 113* 90  BUN _1 <5*  CREATININE 0.80 0.77 0.61 0.64 0.55  CALCIUM 9.1  7.9* 8.1* 8.2* 8.2*  MG  --   --  1.6*  --  1.9  PHOS  --   --  3.3  --   --    GFR: Estimated Creatinine Clearance: 127.5 mL/min (by C-G formula based on SCr of 0.55 mg/dL). Liver Function Tests: Recent Labs  Lab 10/16/19 2000 10/17/19 0628 10/18/19 0316 10/19/19 0323 10/20/19 0324  AST 21 82* 38 51* 25  ALT _2 ALKPHOS 196* 268* 244* 373* 327*  BILITOT 0.9 1.0 1.1 1.3* 1.0  PROT 7.6 6.3* 5.7* 6.3* 6.0*  ALBUMIN 3.3* 2.6* 2.4* 2.5* 2.4*   Recent Labs  Lab 10/16/19 2000 10/18/19 0316 10/19/19 0323 10/20/19 0324 10/21/19 0421  LIPASE 128*  119* 88* 68* 97*   No results for input(s): AMMONIA in the last 168 hours. Coagulation Profile: Recent Labs  Lab 10/19/19 1309  INR 1.4*   Cardiac Enzymes: No results for input(s): CKTOTAL, CKMB, CKMBINDEX, TROPONINI in the last 168 hours. BNP (last 3 results) No results for input(s): PROBNP in the last 8760 hours. HbA1C: No results for input(s): HGBA1C in the last 72 hours. CBG: No results for input(s): GLUCAP in the last 168 hours. Lipid Profile: No results for input(s): CHOL, HDL, LDLCALC, TRIG, CHOLHDL, LDLDIRECT in the last 72 hours. Thyroid Function Tests: No results for input(s): TSH, T4TOTAL, FREET4, T3FREE, THYROIDAB in the last 72 hours. Anemia Panel: No results for input(s): VITAMINB12, FOLATE, FERRITIN, TIBC, IRON, RETICCTPCT in the last 72 hours. Sepsis Labs: Recent Labs  Lab 10/16/19 2313  LATICACIDVEN 1.0    Recent Results (from the past 240 hour(s))  SARS Coronavirus 2 by RT PCR (hospital order, performed in [facility] hospital lab) Nasopharyngeal Nasopharyngeal Swab     Status: None   Collection Time: 10/17/19  3:38 AM   Specimen: Nasopharyngeal Swab  Result Value Ref Range Status   SARS Coronavirus 2 NEGATIVE NEGATIVE Final    Comment: (NOTE) SARS-CoV-2 target nucleic acids are NOT DETECTED.  The SARS-CoV-2 RNA is generally detectable in upper and lower respiratory specimens during the  acute phase of infection. The lowest concentration of SARS-CoV-2 viral copies this assay can detect is 250 copies / mL. A negative result does not preclude SARS-CoV-2 infection and should not be used as the sole basis for treatment or other patient management decisions.  A negative result may occur with improper specimen collection / handling, submission of specimen other than nasopharyngeal swab, presence of viral mutation(s) within the areas targeted by this assay, and inadequate number of viral copies (<250 copies / mL). A negative result must be combined with clinical observations, patient history, and epidemiological information.  Fact Sheet for Patients:   StrictlyIdeas.no  Fact Sheet for Healthcare Providers: BankingDealers.co.za  This test is not yet approved or  cleared by the [place] FDA and has been authorized for detection and/or diagnosis of SARS-CoV-2 by FDA under an Emergency Use Authorization (EUA).  This EUA will remain in effect (meaning this test can be used) for the duration of the COVID-19 declaration under Section 564(b)(1) of the Act, 21 U.S.C. section 360bbb-3(b)(1), unless the authorization is terminated or revoked sooner.  Performed at [facility], [place] [address], [place], [place] [address]     Radiology Studies: No results found.   LOS: 4 days   Antonieta Pert, MD [facility]  10/21/2019, 1:00 PM

## 2019-10-22 ENCOUNTER — Inpatient Hospital Stay (HOSPITAL_COMMUNITY): Payer: BC Managed Care – PPO

## 2019-10-22 LAB — CBC
HCT: 32.3 % — ABNORMAL LOW (ref 36.0–46.0)
Hemoglobin: 10 g/dL — ABNORMAL LOW (ref 12.0–15.0)
MCH: 28.7 pg (ref 26.0–34.0)
MCHC: 31 g/dL (ref 30.0–36.0)
MCV: 92.6 fL (ref 80.0–100.0)
Platelets: 286 10*3/uL (ref 150–400)
RBC: 3.49 MIL/uL — ABNORMAL LOW (ref 3.87–5.11)
RDW: 13.5 % (ref 11.5–15.5)
WBC: 6.8 10*3/uL (ref 4.0–10.5)
nRBC: 0 % (ref 0.0–0.2)

## 2019-10-22 LAB — COMPREHENSIVE METABOLIC PANEL
ALT: 21 U/L (ref 0–44)
AST: 59 U/L — ABNORMAL HIGH (ref 15–41)
Albumin: 2.6 g/dL — ABNORMAL LOW (ref 3.5–5.0)
Alkaline Phosphatase: 475 U/L — ABNORMAL HIGH (ref 38–126)
Anion gap: 10 (ref 5–15)
BUN: <5 mg/dL — ABNORMAL LOW (ref 6–20)
CO2: 27 mmol/L (ref 22–32)
Calcium: 8.5 mg/dL — ABNORMAL LOW (ref 8.9–10.3)
Chloride: 98 mmol/L (ref 98–111)
Creatinine, Ser: 0.62 mg/dL (ref 0.44–1.00)
GFR calc Af Amer: >60 mL/min (ref >60)
GFR calc non Af Amer: >60 mL/min (ref >60)
Glucose, Bld: 94 mg/dL (ref 70–99)
Potassium: 4 mmol/L (ref 3.5–5.1)
Sodium: 135 mmol/L (ref 135–145)
Total Bilirubin: 0.8 mg/dL (ref 0.3–1.2)
Total Protein: 6.3 g/dL — ABNORMAL LOW (ref 6.5–8.1)

## 2019-10-22 LAB — LIPASE, BLOOD: Lipase: 89 U/L — ABNORMAL HIGH (ref 11–51)

## 2019-10-22 MED ORDER — PHENOL 1.4 % MT LIQD
1.0000 | OROMUCOSAL | Status: DC | PRN
  Filled 2019-10-22: qty 177

## 2019-10-22 NOTE — Progress Notes (Signed)
Progress Note  CC:   pancreatitis       ASSESSMENT AND PLAN:   Katarzyna Wolven a 34 y.o.femalePMH significant for, but not necessarily limited to, fatty liver, HTN, asthma ,obesity ,diastolic heart failure,splenic vein thrombosis, prolonged QT,alcoholism( remission), tobacco abuse,cholecystectomy  # Recurrent pancreatitis nowwith multiple peripancreatic , forming pseudocysts, largest measuring 7.1 x 5.1 x 5.8 cm. --Lipase 89. Parameters are good, WBC normal, renal function normal, afebrile, hct 32%.  --Tolerating clears. Unable to advance to full liquids without pain and nausea.. Tolerated only two bites of grits yesterday and today. No solid food in a couple of months.  Will start post-pyloric feedings today.   --She will need follow up imaging in a couple of months. If pseudocysts matured they may need to be drained if she continues to have pain / fullness after resolution of pancreatitis. She has lost 15 pounds due to inability to eat.  --Etiology for recurrent pancreatitis in absence of Etoh? She could have acute on chronic pancreatitis. Takes Lasix at home which has been linked to pancreatitis. Her gallbladder is out. Alk phos is up but probably secondary to pancreatic head edema. No biliary duct dilation, bilirubin is normal. Consider screening for genetic causes.  .   # Hypoalbuminemia. Albumin 2.6 --May be reactive vrs malnutrition in setting of severely diminished intake over last couple of months.  #Hepatomegaly/ hepaticsteatosis --No evidence for cirrhosis on imaging.  --mild ascites present on imaging but may be related to pancreatitis. --platelets are normal. Minor elevation in INR to 1.4 ( possibly nutritional) --Albumin is low but could be reactive and also explained by poor nutrition  --Long term she will need to lose weight to help with management of steatosis.   # Chronic SVT , collaterals present.     #History of alcohol abuse --In remission for 3 years    SUBJECTIVE   Couldn't tolerate pudding due to pain and nausea. . Only solids tolerated has been 2 bites of grits. Does okay with clears.     OBJECTIVE:     Vital signs in last 24 hours: Temp:  [97.9 F (36.6 C)-98.6 F (37 C)] 98.6 F (37 C) (07/05 0437) Pulse Rate:  [87-92] 92 (07/05 0437) Resp:  [18] 18 (07/05 0437) BP: (112-117)/(68-84) 112/78 (07/05 0437) SpO2:  [95 %-96 %] 95 % (07/05 0900) Weight:  [113.8 kg] 113.8 kg (07/05 0723) Last BM Date: 10/21/19 General:   Alert, in NAD Heart:  Regular rate and rhythm.  No lower extremity edema   Pulm: Normal respiratory effort   Abdomen:  Soft,  Mild diffuse tenderness, nondistended.  Normal bowel sounds.          Neurologic:  Alert and  oriented,  grossly normal neurologically. Psych:  Pleasant, cooperative.  Normal mood and affect.   Intake/Output from previous day: 07/04 0701 - 07/05 0700 In: 960 [P.O.:960] Out: -  Intake/Output this shift: No intake/output data recorded.  Lab Results: Recent Labs    10/22/19 0440  WBC 6.8  HGB 10.0*  HCT 32.3*  PLT 286   BMET Recent Labs    10/20/19 0324 10/22/19 0440  NA 137 135  K 3.2* 4.0  CL 101 98  CO2 28 27  GLUCOSE 90 94  BUN <5* <5*  CREATININE 0.55 0.62  CALCIUM 8.2* 8.5*   LFT Recent Labs    10/22/19 0440  PROT 6.3*  ALBUMIN 2.6*  AST 59*  ALT 21  ALKPHOS 475*  BILITOT 0.8  PT/INR Recent Labs    10/19/19 1309  LABPROT 17.0*  INR 1.4*    Principal Problem:   Acute pancreatitis Active Problems:   Polyneuropathy   Benign essential HTN   Asthma, severe persistent, well-controlled   Pancreatic pseudocyst/cyst   Fatty liver     LOS: 5 days   [name] ,NP 10/22/2019, 10:58 AM

## 2019-10-22 NOTE — Progress Notes (Signed)
PROGRESS NOTE    Kara [name]  JOI:[id number] DOB: 1985/09/16 DOA: 10/16/2019 PCP: Lawerance Cruel, MD   Chef Complaints: Abdominal pain  Brief Narrative: 34 year old with history of hypertension, asthma, previously alcoholic who quit for 3 years and history of alcohol induced pancreatitis, cholecystectomy after an episode of pancreatitis last year, last exacerbation in May 2020 and none since then presents with worsening abdominal pain.  Ongoing for about 2 months but worse for 2 days. In the emergency room, tachycardic, afebrile, WBC 13.5.  Renal functions normal.  Calcium normal.  MRI of the abdomen shows multiple uncomplicated pancreatic cyst and acute inflammation on the head of the pancreas.  Lipase is 128. Patient was admitted for further management.  Subjective: Seen this morning. Complains of abdominal pain fullness after eating but able to tolerate some grit and liquid diet Sting comfortably and watching phone  Assessment & Plan:  Acute pancreatitis, recurrent, with multiple pancreatic, peripancreatic fluid collection, forming pseudocyst the largest measuring 7.1 X5.1X 5.8 cm: Alcohol was felt to be the etiology and previous pancreatitis but patient reports abstinence for 3 years, had lap chole in March 2020.  MRCP done here, refer to the report.  GI consulted 7/2, appreciate input, Lasix discontinued as it could be contributing. Continue full liquid diet, IV and oral pain medication, await for further GI input for diet advancement. Patient would like GI to follow closely.LFTs stable with elevated alk phos, elevated lipase 60s to 90s.  Hypoalbuminemia in the setting of pancreatitis: Augment nutrition continue supplement.  Hepatomegaly/hepatic steatosis no evidence of cirrhosis. Will benefit with weight loss and follow-up  As outpatient.  Leukocytosis: Likely from #1.  Resolved.  Polyneuropathy-stable. Benign essential HTN: BP controlled on home metoprolol. Lasix discontinued  due to pancreatitis.   History of asthma, severe persistent: Respiratory status is stable. Doing well on room air without wheezing.  Hypomagnesemia: Resolved. Hypokalemia resolved.  Sertraline, Seroquel and bupropion Home regimen continued  Morbid obesity with BMI 44: She will benefit with weight loss and healthy lifestyle and outpatient follow-up with PCP.  DVT prophylaxis: SCDs Start: 10/17/19 0608 Code Status: full Family Communication: plan of care discussed with patient at bedside.  Status is: Inpatient  Remains inpatient appropriate because:IV treatments appropriate due to intensity of illness or inability to take PO and Inpatient level of care appropriate due to severity of illness   Dispo: The patient is from: [place], independent.              Anticipated d/c is to: Home              Anticipated d/c date is: 2 days              Patient currently is not medically stable to d/c.  Planning for discharge home once able to take p.o. well and currently remains on full liquid diet, GI following closely.   Nutrition: Diet Order            Diet full liquid Room service appropriate? Yes; Fluid consistency: Thin  Diet effective now                 Body mass index is 41.75 kg/m.  Consultants: Gastroenterology Procedures:see note Microbiology:see note  Medications: Scheduled Meds: . buPROPion  150 mg Oral Daily  . buPROPion  300 mg Oral QHS  . enoxaparin (LOVENOX) injection  60 mg Subcutaneous Q24H  . fluticasone furoate-vilanterol  1 puff Inhalation Daily  . gabapentin  900 mg Oral BID  .  melatonin  10 mg Oral QHS  . metoprolol succinate  50 mg Oral Daily  . QUEtiapine  100 mg Oral QHS  . sertraline  75 mg Oral Daily  . umeclidinium bromide  1 puff Inhalation Daily   Continuous Infusions:   Antimicrobials: Anti-infectives (From admission, onward)   None       Objective: Vitals: Today's Vitals   10/22/19 0723 10/22/19 0828 10/22/19 0859 10/22/19 0900    BP:      Pulse:      Resp:      Temp:      TempSrc:      SpO2:    95%  Weight: 113.8 kg     Height:      PainSc:  8  5      Intake/Output Summary (Last 24 hours) at 10/22/2019 1008 Last data filed at 10/21/2019 1300 Gross per 24 hour  Intake 480 ml  Output --  Net 480 ml   Filed Weights   10/21/19 0500 10/21/19 0832 10/22/19 0723  Weight: 120.3 kg 120.3 kg 113.8 kg   Weight change: 0 kg   Intake/Output from previous day: 07/04 0701 - 07/05 0700 In: 960 [P.O.:960] Out: -  Intake/Output this shift: No intake/output data recorded.  Examination:  General exam: AAOx3, NAD, weak appearing. HEENT:Oral mucosa moist, Ear/Nose WNL grossly, dentition normal. Respiratory system: bilaterally clear,no wheezing or crackles,no use of accessory muscle Cardiovascular system: S1 & S2 +, No JVD,. Gastrointestinal system: Abdomen soft,obesem full, mildly Tender,ND, BS+ Nervous System:Alert, awake, moving extremities and grossly nonfocal Extremities: No edema, distal peripheral pulses palpable.  Skin: No rashes,no icterus. MSK: Normal muscle bulk,tone, power    Data Reviewed: I have personally reviewed following labs and imaging studies CBC: Recent Labs  Lab 10/16/19 2000 10/17/19 0628 10/18/19 0316 10/22/19 0440  WBC 13.5* 9.9 7.3 6.8  NEUTROABS  --  6.0 4.3  --   HGB 13.0 11.4* 10.5* 10.0*  HCT 41.3 36.6 33.7* 32.3*  MCV 90.8 92.9 92.1 92.6  PLT 434* 353 258 916   Basic Metabolic Panel: Recent Labs  Lab 10/17/19 0628 10/18/19 0316 10/19/19 0323 10/20/19 0324 10/22/19 0440  NA 136 137 136 137 135  K 3.7 3.5 3.5 3.2* 4.0  CL 102 103 100 101 98  CO2 '24 24 23 28 27  '$ GLUCOSE 105* 85 113* 90 94  BUN '8 6 6 '$ <5* <5*  CREATININE 0.77 0.61 0.64 0.55 0.62  CALCIUM 7.9* 8.1* 8.2* 8.2* 8.5*  MG  --  1.6*  --  1.9  --   PHOS  --  3.3  --   --   --    GFR: Estimated Creatinine Clearance: 123.5 mL/min (by C-G formula based on SCr of 0.62 mg/dL). Liver Function Tests: Recent  Labs  Lab 10/17/19 0628 10/18/19 0316 10/19/19 0323 10/20/19 0324 10/22/19 0440  AST 82* 38 51* 25 59*  ALT '20 17 17 13 21  '$ ALKPHOS 268* 244* 373* 327* 475*  BILITOT 1.0 1.1 1.3* 1.0 0.8  PROT 6.3* 5.7* 6.3* 6.0* 6.3*  ALBUMIN 2.6* 2.4* 2.5* 2.4* 2.6*   Recent Labs  Lab 10/18/19 0316 10/19/19 0323 10/20/19 0324 10/21/19 0421 10/22/19 0440  LIPASE 119* 88* 68* 97* 89*   No results for input(s): AMMONIA in the last 168 hours. Coagulation Profile: Recent Labs  Lab 10/19/19 1309  INR 1.4*   Cardiac Enzymes: No results for input(s): CKTOTAL, CKMB, CKMBINDEX, TROPONINI in the last 168 hours. BNP (last 3 results) No results  for input(s): PROBNP in the last 8760 hours. HbA1C: No results for input(s): HGBA1C in the last 72 hours. CBG: No results for input(s): GLUCAP in the last 168 hours. Lipid Profile: No results for input(s): CHOL, HDL, LDLCALC, TRIG, CHOLHDL, LDLDIRECT in the last 72 hours. Thyroid Function Tests: No results for input(s): TSH, T4TOTAL, FREET4, T3FREE, THYROIDAB in the last 72 hours. Anemia Panel: No results for input(s): VITAMINB12, FOLATE, FERRITIN, TIBC, IRON, RETICCTPCT in the last 72 hours. Sepsis Labs: Recent Labs  Lab 10/16/19 2313  LATICACIDVEN 1.0    Recent Results (from the past 240 hour(s))  SARS Coronavirus 2 by RT PCR (hospital order, performed in [facility] hospital lab) Nasopharyngeal Nasopharyngeal Swab     Status: None   Collection Time: 10/17/19  3:38 AM   Specimen: Nasopharyngeal Swab  Result Value Ref Range Status   SARS Coronavirus 2 NEGATIVE NEGATIVE Final    Comment: (NOTE) SARS-CoV-2 target nucleic acids are NOT DETECTED.  The SARS-CoV-2 RNA is generally detectable in upper and lower respiratory specimens during the acute phase of infection. The lowest concentration of SARS-CoV-2 viral copies this assay can detect is 250 copies / mL. A negative result does not preclude SARS-CoV-2 infection and should not be used as the  sole basis for treatment or other patient management decisions.  A negative result may occur with improper specimen collection / handling, submission of specimen other than nasopharyngeal swab, presence of viral mutation(s) within the areas targeted by this assay, and inadequate number of viral copies (<250 copies / mL). A negative result must be combined with clinical observations, patient history, and epidemiological information.  Fact Sheet for Patients:   StrictlyIdeas.no  Fact Sheet for Healthcare Providers: BankingDealers.co.za  This test is not yet approved or  cleared by the [place] FDA and has been authorized for detection and/or diagnosis of SARS-CoV-2 by FDA under an Emergency Use Authorization (EUA).  This EUA will remain in effect (meaning this test can be used) for the duration of the COVID-19 declaration under Section 564(b)(1) of the Act, 21 U.S.C. section 360bbb-3(b)(1), unless the authorization is terminated or revoked sooner.  Performed at [facility], [place] [address] [place], [place] [address]     Radiology Studies: No results found.   LOS: 5 days   Antonieta Pert, MD [facility]  10/22/2019, 10:08 AM

## 2019-10-23 ENCOUNTER — Other Ambulatory Visit: Payer: Self-pay | Admitting: Family Medicine

## 2019-10-23 DIAGNOSIS — K863 Pseudocyst of pancreas: Secondary | ICD-10-CM

## 2019-10-23 DIAGNOSIS — E440 Moderate protein-calorie malnutrition: Secondary | ICD-10-CM | POA: Insufficient documentation

## 2019-10-23 LAB — COMPREHENSIVE METABOLIC PANEL
ALT: 16 U/L (ref 0–44)
AST: 32 U/L (ref 15–41)
Albumin: 2.6 g/dL — ABNORMAL LOW (ref 3.5–5.0)
Alkaline Phosphatase: 438 U/L — ABNORMAL HIGH (ref 38–126)
Anion gap: 11 (ref 5–15)
BUN: <5 mg/dL — ABNORMAL LOW (ref 6–20)
CO2: 27 mmol/L (ref 22–32)
Calcium: 8.7 mg/dL — ABNORMAL LOW (ref 8.9–10.3)
Chloride: 99 mmol/L (ref 98–111)
Creatinine, Ser: 0.56 mg/dL (ref 0.44–1.00)
GFR calc Af Amer: >60 mL/min (ref >60)
GFR calc non Af Amer: >60 mL/min (ref >60)
Glucose, Bld: 91 mg/dL (ref 70–99)
Potassium: 3.5 mmol/L (ref 3.5–5.1)
Sodium: 137 mmol/L (ref 135–145)
Total Bilirubin: 0.9 mg/dL (ref 0.3–1.2)
Total Protein: 6.6 g/dL (ref 6.5–8.1)

## 2019-10-23 LAB — GLUCOSE, CAPILLARY
Glucose-Capillary: 92 mg/dL (ref 70–99)
Glucose-Capillary: 71 mg/dL (ref 70–99)
Glucose-Capillary: 93 mg/dL (ref 70–99)
Glucose-Capillary: 126 mg/dL — ABNORMAL HIGH (ref 70–99)

## 2019-10-23 LAB — LIPASE, BLOOD: Lipase: 72 U/L — ABNORMAL HIGH (ref 11–51)

## 2019-10-23 MED ORDER — OSMOLITE 1.2 CAL PO LIQD
1000.0000 mL | ORAL | Status: DC
  Administered 2019-10-23 (×2): 1000 mL
  Filled 2019-10-23: qty 1000

## 2019-10-23 MED ORDER — OSMOLITE 1.5 CAL PO LIQD
1000.0000 mL | ORAL | Status: DC
  Administered 2019-10-23 – 2019-10-24 (×2): 1000 mL
  Filled 2019-10-23 (×4): qty 1000

## 2019-10-23 NOTE — Progress Notes (Signed)
10/23/2019  [id number] Paged [phone] Shanda Bumps, RD to see patient regarding tube feeds. Waiting for response.

## 2019-10-23 NOTE — Progress Notes (Signed)
10/23/2019  0900 Paged Amy with [facility]  For Dr Marquis Lunch to see if the Dobhoff placements is okay for tube feeds to begin. Waiting for response.

## 2019-10-23 NOTE — Progress Notes (Signed)
PROGRESS NOTE    Kara [name]  NGE:[id number] DOB: 01-Oct-1985 DOA: 10/16/2019 PCP: [name], MD   Chef Complaints: Abdominal pain  Brief Narrative: 34 year old with history of hypertension, asthma, previously alcoholic who quit for 3 years and history of alcohol induced pancreatitis, cholecystectomy after an episode of pancreatitis last year, last exacerbation in May 2020 and none since then presents with worsening abdominal pain.  Ongoing for about 2 months but worse for 2 days. In the emergency room, tachycardic, afebrile, WBC 13.5.  Renal functions normal.  Calcium normal.  MRI of the abdomen shows multiple uncomplicated pancreatic cyst and acute inflammation on the head of the pancreas.  Lipase is 128. Patient was admitted for further management.  Subjective: Taking liquids, ngt+ Awaiting to initiate tube feeds Still c/o abd pain and fullness needing iv dilaudid  Assessment & Plan:  Acute pancreatitis, recurrent, with multiple pancreatic, peripancreatic fluid collection, forming pseudocyst the largest measuring 7.1 X5.1X 5.8 cm: Alcohol was felt to be the etiology and previous pancreatitis but patient reports abstinence for 3 years, had lap chole in March 2020.  MRCP done here, refer to the report.  GI consulted 7/2, appreciate input, Lasix discontinued as it could be contributing. remaisn symptomatic, not tolerating diet well, NGT PER GI and RD consulted to start tube feeds.Cont pain control with  Po and iv opiates. lfts stable w elevated ALP. Lipase 60-90  Hypoalbuminemia in the setting of pancreatitis: Augment nutrition w NGT  Leukocytosis: Likely from #1.  Resolved.  Polyneuropathy-stable.  Benign essential HTN: BP controlled on home metoprolol  History of asthma, severe persistent: Respiratory status is stable. On room air and stable.  Hypomagnesemia: Resolved.  Hypokalemia resolved.  Hepatomegaly/hepatic steatosis no evidence of cirrhosis. WILL NEED OP F/U WITH  PCP/GI Morbid obesity with BMI 44: will benefit with wt loss and healthy lifestyle.  Cont home Sertraline, Seroquel and bupropion   DVT prophylaxis: SCDs Start: 10/17/19 0608 Code Status: full Family Communication: plan of care discussed with patient at bedside.  Status is: Inpatient  Remains inpatient appropriate because:IV treatments appropriate due to intensity of illness or inability to take PO and Inpatient level of care appropriate due to severity of illness  Dispo: The patient is from: Home Home, independent.              Anticipated d/c is to: Home              Anticipated d/c date is: >3 days days              Patient currently is not medically stable to d/c.Planning for discharge home once able to take p.o. and once GI signs off.  Nutrition: Diet Order            Diet full liquid Room service appropriate? Yes; Fluid consistency: Thin  Diet effective now                 Body mass index is 41.27 kg/m.  Consultants: Gastroenterology Procedures:see note Microbiology:see note  Medications: Scheduled Meds:  buPROPion  150 mg Oral Daily   buPROPion  300 mg Oral QHS   enoxaparin (LOVENOX) injection  60 mg Subcutaneous Q24H   fluticasone furoate-vilanterol  1 puff Inhalation Daily   gabapentin  900 mg Oral BID   melatonin  10 mg Oral QHS   metoprolol succinate  50 mg Oral Daily   QUEtiapine  100 mg Oral QHS   sertraline  75 mg Oral Daily   umeclidinium bromide  1 puff Inhalation Daily   Continuous Infusions:  feeding supplement (OSMOLITE 1.2 CAL) 1,000 mL (10/23/19 1120)    Antimicrobials: Anti-infectives (From admission, onward)   None       Objective: Vitals: Today's Vitals   10/23/19 0520 10/23/19 0843 10/23/19 0927 10/23/19 1135  BP: 112/89  114/88   Pulse: 90  90   Resp: 17  16   Temp: 98.6 F (37 C)  (!) 97.5 F (36.4 C)   TempSrc: Oral  Oral   SpO2: 94% 95% 96%   Weight:      Height:      PainSc:   8  8     Intake/Output  Summary (Last 24 hours) at 10/23/2019 1222 Last data filed at 10/23/2019 0940 Gross per 24 hour  Intake 0 ml  Output --  Net 0 ml   Filed Weights   10/21/19 0832 10/22/19 0723 10/23/19 0500  Weight: 120.3 kg 113.8 kg 112.5 kg   Weight change: -6.492 kg   Intake/Output from previous day: No intake/output data recorded. Intake/Output this shift: No intake/output data recorded.  Examination:  General exam: AAO x3, NAD, weak appearing. HEENT:Oral mucosa moist, Ear/Nose WNL grossly, dentition normal. Respiratory system: bilaterally clear,no wheezing or crackles,no use of accessory muscle Cardiovascular system: S1 & S2 +, No JVD,. Gastrointestinal system: Abdomen soft, diffusely Tender, voluntary guarding, ND, BS+ Nervous System:Alert, awake, moving extremities and grossly nonfocal Extremities: No edema, distal peripheral pulses palpable.  Skin: No rashes,no icterus. MSK: Normal muscle bulk,tone, power    Data Reviewed: I have personally reviewed following labs and imaging studies CBC: Recent Labs  Lab 10/16/19 2000 10/17/19 0628 10/18/19 0316 10/22/19 0440  WBC 13.5* 9.9 7.3 6.8  NEUTROABS  --  6.0 4.3  --   HGB 13.0 11.4* 10.5* 10.0*  HCT 41.3 36.6 33.7* 32.3*  MCV 90.8 92.9 92.1 92.6  PLT 434* 353 258 286   Basic Metabolic Panel: Recent Labs  Lab 10/18/19 0316 10/19/19 0323 10/20/19 0324 10/22/19 0440 10/23/19 0346  NA 137 136 137 135 137  K 3.5 3.5 3.2* 4.0 3.5  CL 103 100 101 98 99  CO2 24 23 28 27 27   GLUCOSE 85 113* 90 94 91  BUN 6 6 <5* <5* <5*  CREATININE 0.61 0.64 0.55 0.62 0.56  CALCIUM 8.1* 8.2* 8.2* 8.5* 8.7*  MG 1.6*  --  1.9  --   --   PHOS 3.3  --   --   --   --    GFR: Estimated Creatinine Clearance: 122.7 mL/min (by C-G formula based on SCr of 0.56 mg/dL). Liver Function Tests: Recent Labs  Lab 10/18/19 0316 10/19/19 0323 10/20/19 0324 10/22/19 0440 10/23/19 0346  AST 38 51* 25 59* 32  ALT 17 17 13 21 16   ALKPHOS 244* 373* 327*  475* 438*  BILITOT 1.1 1.3* 1.0 0.8 0.9  PROT 5.7* 6.3* 6.0* 6.3* 6.6  ALBUMIN 2.4* 2.5* 2.4* 2.6* 2.6*   Recent Labs  Lab 10/19/19 0323 10/20/19 0324 10/21/19 0421 10/22/19 0440 10/23/19 0346  LIPASE 88* 68* 97* 89* 72*   No results for input(s): AMMONIA in the last 168 hours. Coagulation Profile: Recent Labs  Lab 10/19/19 1309  INR 1.4*   Cardiac Enzymes: No results for input(s): CKTOTAL, CKMB, CKMBINDEX, TROPONINI in the last 168 hours. BNP (last 3 results) No results for input(s): PROBNP in the last 8760 hours. HbA1C: No results for input(s): HGBA1C in the last 72 hours. CBG: Recent Labs  Lab  10/23/19 1152  GLUCAP 92   Lipid Profile: No results for input(s): CHOL, HDL, LDLCALC, TRIG, CHOLHDL, LDLDIRECT in the last 72 hours. Thyroid Function Tests: No results for input(s): TSH, T4TOTAL, FREET4, T3FREE, THYROIDAB in the last 72 hours. Anemia Panel: No results for input(s): VITAMINB12, FOLATE, FERRITIN, TIBC, IRON, RETICCTPCT in the last 72 hours. Sepsis Labs: Recent Labs  Lab 10/16/19 2313  LATICACIDVEN 1.0    Recent Results (from the past 240 hour(s))  SARS Coronavirus 2 by RT PCR (hospital order, performed in [facility] hospital lab) Nasopharyngeal Nasopharyngeal Swab     Status: None   Collection Time: 10/17/19  3:38 AM   Specimen: Nasopharyngeal Swab  Result Value Ref Range Status   SARS Coronavirus 2 NEGATIVE NEGATIVE Final    Comment: (NOTE) SARS-CoV-2 target nucleic acids are NOT DETECTED.  The SARS-CoV-2 RNA is generally detectable in upper and lower respiratory specimens during the acute phase of infection. The lowest concentration of SARS-CoV-2 viral copies this assay can detect is 250 copies / mL. A negative result does not preclude SARS-CoV-2 infection and should not be used as the sole basis for treatment or other patient management decisions.  A negative result may occur with improper specimen collection / handling, submission of specimen  other than nasopharyngeal swab, presence of viral mutation(s) within the areas targeted by this assay, and inadequate number of viral copies (<250 copies / mL). A negative result must be combined with clinical observations, patient history, and epidemiological information.  Fact Sheet for Patients:   BoilerBrush.com.cy  Fact Sheet for Healthcare Providers: https://pope.com/  This test is not yet approved or  cleared by the [place] FDA and has been authorized for detection and/or diagnosis of SARS-CoV-2 by FDA under an Emergency Use Authorization (EUA).  This EUA will remain in effect (meaning this test can be used) for the duration of the COVID-19 declaration under Section 564(b)(1) of the Act, 21 U.S.C. section 360bbb-3(b)(1), unless the authorization is terminated or revoked sooner.  Performed at [facility], [address] [place], [place] [address]     Radiology Studies: DG Abd Portable 1V  Result Date: 10/22/2019 CLINICAL DATA:  Dobbhoff tube placement EXAM: PORTABLE ABDOMEN - 1 VIEW COMPARISON:  None. FINDINGS: Dobbhoff tube is identified with distal tip in the mid stomach. Patchy opacity of left lung base is identified. The heart size is enlarged. IMPRESSION: Dobbhoff tube is identified with distal tip in the mid stomach. Electronically Signed   By: Sherian Rein M.D.   On: 10/22/2019 14:35     LOS: 6 days   Lanae Boast, MD [facility]  10/23/2019, 12:22 PM

## 2019-10-23 NOTE — Plan of Care (Signed)
  Problem: Nutrition: Goal: Adequate nutrition will be maintained Outcome: Progressing   Problem: Elimination: Goal: Will not experience complications related to bowel motility Outcome: Progressing   Problem: Safety: Goal: Ability to remain free from injury will improve Outcome: Progressing   

## 2019-10-23 NOTE — Progress Notes (Signed)
Lab draws for HIV, Hep A and B entered due to employee blood exposure per the exposure policy.

## 2019-10-23 NOTE — Progress Notes (Signed)
Initial Nutrition Assessment  DOCUMENTATION CODES:   Obesity unspecified, Non-severe (moderate) malnutrition in context of acute illness/injury  INTERVENTION:   Monitor magnesium, potassium, and phosphorus daily for at least 3 days, MD to replete as needed, as pt is at risk for refeeding syndrome.  -Switch to Osmolite 1.5 @ 20 ml/hr, advance by 10 ml every 12 hours to goal rate of 55 ml/hr via post-pyloric NGT. -This regimen provides 1980 kcals, 82g protein and 1005 ml H2O.  -Encourage PO intakes as tolerated  NUTRITION DIAGNOSIS:   Moderate Malnutrition related to acute illness (acute pancreatitis) as evidenced by energy intake < or equal to 75% for > or equal to 1 month, mild fat depletion, mild muscle depletion.  GOAL:   Patient will meet greater than or equal to 90% of their needs  MONITOR:   PO intake, Labs, Weight trends, I & O's, TF tolerance, Diet advancement  REASON FOR ASSESSMENT:   Consult Enteral/tube feeding initiation and management  ASSESSMENT:   34 year old with history of hypertension, asthma, previously alcoholic who quit for 3 years and history of alcohol induced pancreatitis, cholecystectomy after an episode of pancreatitis last year, last exacerbation in May 2020 and none since then presents with worsening abdominal pain.  Ongoing for about 2 months but worse for 2 days.  6/29: admitted 7/5: s/p post-pyloric NGT placed  Patient reports last consuming food ~24 hours ago but has not been able to tolerate PO for weeks now. States she has been having N/V, only able to keep some clears down. Currently denies any nausea.  Patient states NGT is more comfortable now than expected. States she is currently tolerating Osmolite 1.2 @ 20 ml/hr but at the time was only just started ~20 minutes prior to RD visit. Pt is still trying to sip on clears like juice. Encouraged pt to continue to consume liquids as tolerated.  Per GI note, plan is to continue to advance diet  as tolerated and to eventually wean off tube feeds.   Admission weight: 253 lbs. Current weight: 248 lbs.  Pt reports she has lost 15 lbs over 1.5 weeks which is significant for time frame.  Pt reports weakness is arms, legs, and hands.  Medications reviewed. Labs reviewed: CBGs: 92  NUTRITION - FOCUSED PHYSICAL EXAM:    Most Recent Value  Orbital Region No depletion  Upper Arm Region Mild depletion  Thoracic and Lumbar Region Unable to assess  Buccal Region No depletion  Temple Region No depletion  Clavicle Bone Region No depletion  Clavicle and Acromion Bone Region No depletion  Scapular Bone Region No depletion  Dorsal Hand Mild depletion  Patellar Region No depletion  Anterior Thigh Region No depletion  Posterior Calf Region Mild depletion  Edema (RD Assessment) None       Diet Order:   Diet Order            Diet full liquid Room service appropriate? Yes; Fluid consistency: Thin  Diet effective now                 EDUCATION NEEDS:   Education needs have been addressed  Skin:  Skin Assessment: Reviewed RN Assessment  Last BM:  7/4  Height:   Ht Readings from Last 1 Encounters:  10/21/19 5\' 5"  (1.651 m)    Weight:   Wt Readings from Last 1 Encounters:  10/23/19 112.5 kg    BMI:  Body mass index is 41.27 kg/m.  Estimated Nutritional Needs:   Kcal:  2000-2200  Protein:  75-90g  Fluid:  2L/day  Tilda Franco, MS, RD, LDN Inpatient Clinical Dietitian Contact information available via Amion

## 2019-10-23 NOTE — Progress Notes (Signed)
Patient ID: Kara [name], female   DOB: 1985/05/06, 34 y.o.   MRN: [id number]    Progress Note   Subjective   Day # 7  CC; recurrent pancreatitis, multiple peripancreatic pseudocysts  Patient failed attempt at diet advancement, Dobbhoff placed yesterday-KUB shows tip in the mid stomach  Able to tolerate some clears, tube feedings initiated this morning. Abdominal pain and fullness about the same, no current nausea or vomiting   Lipase 73 LFTs normal with exception of alk phos 438   Objective   Vital signs in last 24 hours: Temp:  [97.5 F (36.4 C)-98.6 F (37 C)] 97.5 F (36.4 C) (07/06 0927) Pulse Rate:  [89-91] 90 (07/06 0927) Resp:  [16-17] 16 (07/06 0927) BP: (112-128)/(87-89) 114/88 (07/06 0927) SpO2:  [94 %-96 %] 96 % (07/06 0927) Weight:  [112.5 kg] 112.5 kg (07/06 0500) Last BM Date: 10/21/19 General:    white female in NAD, sitting up working on computer Heart:  Regular rate and rhythm; no murmurs Lungs: Respirations even and unlabored, lungs CTA bilaterally Abdomen: Tender diffusely across the upper abdomen, nondistended. Normal bowel sounds. Extremities:  Without edema. Neurologic:  Alert and oriented,  grossly normal neurologically. Psych:  Cooperative. Normal mood and affect.  Intake/Output from previous day: No intake/output data recorded. Intake/Output this shift: No intake/output data recorded.  Lab Results: Recent Labs    10/22/19 0440  WBC 6.8  HGB 10.0*  HCT 32.3*  PLT 286   BMET Recent Labs    10/22/19 0440 10/23/19 0346  NA 135 137  K 4.0 3.5  CL 98 99  CO2 27 27  GLUCOSE 94 91  BUN <5* <5*  CREATININE 0.62 0.56  CALCIUM 8.5* 8.7*   LFT Recent Labs    10/23/19 0346  PROT 6.6  ALBUMIN 2.6*  AST 32  ALT 16  ALKPHOS 438*  BILITOT 0.9   PT/INR No results for input(s): LABPROT, INR in the last 72 hours.  Studies/Results: DG Abd Portable 1V  Result Date: 10/22/2019 CLINICAL DATA:  Dobbhoff tube placement EXAM: PORTABLE  ABDOMEN - 1 VIEW COMPARISON:  None. FINDINGS: Dobbhoff tube is identified with distal tip in the mid stomach. Patchy opacity of left lung base is identified. The heart size is enlarged. IMPRESSION: Dobbhoff tube is identified with distal tip in the mid stomach. Electronically Signed   By: Abelardo Diesel M.D.   On: 10/22/2019 14:35       Assessment / Plan:    #6 34 year old white female with recurrent acute/subacute pancreatitis with developing pseudocysts.  Patient had been ill at home for 2 weeks prior to admission and had lost about 15 pounds. Patient has multiple prior episodes of pancreatitis per patient felt secondary to EtOH.  She stopped drinking about 3 years ago, had another episode of pancreatitis in 2020, then did okay until recently.  Etiology of this episode of  pancreatitis not entirely clear, MRCP shows no evidence of chronic pancreatitis, no evidence of pancreas divisum She is status post cholecystectomy, Triglycerides within normal limits We will check autoimmune markers   Patient failed trial of diet advancement, Dobbhoff placed yesterday and has initiated tube feeds today Continue clears as tolerated.  I think goal would be to gradually get her tolerating enough clear to full liquids and supplements over the next several days, that tube feedings can be discontinued prior to discharge. Anticipate it will be several weeks for this episode to resolve, and may take months for the pseudocysts to resolve.  Principal Problem:   Acute pancreatitis Active Problems:   Polyneuropathy   Benign essential HTN   Asthma, severe persistent, well-controlled   Pancreatic pseudocyst/cyst   Fatty liver     LOS: 6 days   Cambell Stanek PA-C 10/23/2019, 12:01 PM

## 2019-10-24 DIAGNOSIS — R110 Nausea: Secondary | ICD-10-CM

## 2019-10-24 DIAGNOSIS — E440 Moderate protein-calorie malnutrition: Secondary | ICD-10-CM

## 2019-10-24 LAB — COMPREHENSIVE METABOLIC PANEL
ALT: 14 U/L (ref 0–44)
AST: 25 U/L (ref 15–41)
Albumin: 2.9 g/dL — ABNORMAL LOW (ref 3.5–5.0)
Alkaline Phosphatase: 399 U/L — ABNORMAL HIGH (ref 38–126)
Anion gap: 11 (ref 5–15)
BUN: <5 mg/dL — ABNORMAL LOW (ref 6–20)
CO2: 27 mmol/L (ref 22–32)
Calcium: 9.3 mg/dL (ref 8.9–10.3)
Chloride: 99 mmol/L (ref 98–111)
Creatinine, Ser: 0.58 mg/dL (ref 0.44–1.00)
GFR calc Af Amer: >60 mL/min (ref >60)
GFR calc non Af Amer: >60 mL/min (ref >60)
Glucose, Bld: 128 mg/dL — ABNORMAL HIGH (ref 70–99)
Potassium: 4.1 mmol/L (ref 3.5–5.1)
Sodium: 137 mmol/L (ref 135–145)
Total Bilirubin: 0.7 mg/dL (ref 0.3–1.2)
Total Protein: 7.1 g/dL (ref 6.5–8.1)

## 2019-10-24 LAB — LIPASE, BLOOD: Lipase: 77 U/L — ABNORMAL HIGH (ref 11–51)

## 2019-10-24 LAB — GLUCOSE, CAPILLARY
Glucose-Capillary: 114 mg/dL — ABNORMAL HIGH (ref 70–99)
Glucose-Capillary: 100 mg/dL — ABNORMAL HIGH (ref 70–99)
Glucose-Capillary: 110 mg/dL — ABNORMAL HIGH (ref 70–99)
Glucose-Capillary: 105 mg/dL — ABNORMAL HIGH (ref 70–99)
Glucose-Capillary: 101 mg/dL — ABNORMAL HIGH (ref 70–99)

## 2019-10-24 LAB — FANA STAINING PATTERNS
Homogeneous Pattern: 1:80 {titer}
Speckled Pattern: 1:80 {titer}

## 2019-10-24 LAB — ANTINUCLEAR ANTIBODIES, IFA: ANA Ab, IFA: POSITIVE — AB

## 2019-10-24 LAB — IGG 4: IgG, Subclass 4: 41 mg/dL (ref 2–96)

## 2019-10-24 MED ORDER — DEXTROSE-NACL 5-0.45 % IV SOLN: INTRAVENOUS | Status: DC

## 2019-10-24 MED ORDER — METOCLOPRAMIDE HCL 5 MG/ML IJ SOLN
10.0000 mg | Freq: Three times a day (TID) | INTRAMUSCULAR | Status: DC
  Administered 2019-10-24 – 2019-10-31 (×21): 10 mg via INTRAVENOUS
  Filled 2019-10-24 (×21): qty 2

## 2019-10-24 MED ORDER — METOCLOPRAMIDE HCL 5 MG/ML IJ SOLN
10.0000 mg | Freq: Four times a day (QID) | INTRAMUSCULAR | Status: DC | PRN
  Administered 2019-10-27: 10 mg via INTRAVENOUS
  Filled 2019-10-24: qty 2

## 2019-10-24 NOTE — Progress Notes (Addendum)
Patient ID: Kara [name], female   DOB: Apr 29, 1985, 34 y.o.   MRN: [id number]    Progress Note   Subjective   day # 7  CC ; recurrent acute pancreatitis with pseudocysts  Tube feedings initiated yesterday-patient tolerated sips of clears and tube feedings throughout the day yesterday, however early in the morning hours around 3 AM with tube feedings at 40 cc/h she complained of fullness and nausea and tube feedings have been on hold since.  She has been able to sip some clears this morning nausea has resolved.  She is still having significant pain, and pain not well controlled even with Dilaudid every 2 hours.  She has oxycodone as needed but says it is not at all helpful.  Lipase 77 LFTs normal with exception of alk phos 399 CBC pending ANA/IgG4 pending   Objective   Vital signs in last 24 hours: Temp:  [97.5 F (36.4 C)-98.3 F (36.8 C)] 98.3 F (36.8 C) (07/07 0344) Pulse Rate:  [88-109] 109 (07/07 0344) Resp:  [16-20] 17 (07/07 0344) BP: (111-129)/(84-90) 129/90 (07/07 0344) SpO2:  [95 %-97 %] 95 % (07/07 0824) Weight:  [646 kg] 108 kg (07/07 0344) Last BM Date: 10/21/19 General:    white female in NAD Heart:  Regular rate and rhythm; no murmurs Lungs: Respirations even and unlabored, lungs CTA bilaterally Abdomen:  Soft, quite tender across the upper abdomen, no rebound. Normal bowel sounds. Extremities:  Without edema. Neurologic:  Alert and oriented,  grossly normal neurologically. Psych:  Cooperative. Normal mood and affect.  Intake/Output from previous day: 07/06 0701 - 07/07 0700 In: 297.5 [NG/GT:297.5] Out: -  Intake/Output this shift: No intake/output data recorded.  Lab Results: Recent Labs    10/22/19 0440  WBC 6.8  HGB 10.0*  HCT 32.3*  PLT 286   BMET Recent Labs    10/22/19 0440 10/23/19 0346 10/24/19 0333  NA 135 137 137  K 4.0 3.5 4.1  CL 98 99 99  CO2 _0 GLUCOSE 94 91 128*  BUN <5* <5* <5*  CREATININE 0.62 0.56 0.58  CALCIUM  8.5* 8.7* 9.3   LFT Recent Labs    10/24/19 0333  PROT 7.1  ALBUMIN 2.9*  AST 25  ALT 14  ALKPHOS 399*  BILITOT 0.7   PT/INR No results for input(s): LABPROT, INR in the last 72 hours.  Studies/Results: DG Abd Portable 1V  Result Date: 10/22/2019 CLINICAL DATA:  Dobbhoff tube placement EXAM: PORTABLE ABDOMEN - 1 VIEW COMPARISON:  None. FINDINGS: Dobbhoff tube is identified with distal tip in the mid stomach. Patchy opacity of left lung base is identified. The heart size is enlarged. IMPRESSION: Dobbhoff tube is identified with distal tip in the mid stomach. Electronically Signed   By: Abelardo Diesel M.D.   On: 10/22/2019 14:35       Assessment / Plan:    #19 34 year old white female with recurrent subacute pancreatitis admitted 1 week ago after being ill at home for at least 2 weeks with inability to take p.o.'s. Multiple prior episodes of pancreatitis secondary to EtOH, no EtOH use over the past 3 years.  This is her second episode of pancreatitis that has occurred off EtOH.  She has multiple developing pseudocysts. Tube feeds were initiated yesterday as she had failed attempts at diet advancement.  She tolerated low rate feedings but became nauseated and fall in the middle of the night.  Certainly the multiple pseudocysts are affecting gastric emptying.  Will restart low  rate feedings today at 20 cc an hour, and not plan to go above 30 cc/hr We will start low-dose Reglan IV If she cannot tolerate then may need to DC the Dobbhoff and send for postpyloric tube Continue sips of clears Restart IV fluids Pain management as per hospitalist, consider PCA          Principal Problem:   Acute pancreatitis Active Problems:   Polyneuropathy   Benign essential HTN   Asthma, severe persistent, well-controlled   Pancreatic pseudocyst/cyst   Fatty liver   Malnutrition of moderate degree   Pancreatic pseudocyst     LOS: 7 days   Amy EsterwoodPA-C  10/24/2019, 8:50  AM   I have discussed the case with the PA, and that is the plan I formulated. I personally interviewed and examined the patient. Patient's husband was present for my entire visit. 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  Poor oral intake as a result, low albumin.  CT scan on admission does not show clear sign of gastric outlet obstruction, and the's relatively small cyst in the pancreas would not be causing gastric outlet obstruction.  The fluid collections in the left upper quadrant should also not be doing so.  It is possible there is some decreased GI motility from subacute peripancreatic inflammation and perhaps side effect of inpatient opioid analgesics.  Markedly elevated alkaline phosphatase suggests probable extrahepatic biliary stricture from pancreatitis as well, fortunately bilirubin normal.  IgG4 normal, suggesting not autoimmune pancreatitis.  She is expected to have a slow clinical course requiring prolonged enteral tube feeds for improvement in her protein calorie malnutrition and symptoms. We have resumed her tube feeds, I have encouraged her to continue being out of bed and ambulating as much as possible, which she is committed to doing.  This will help her GI motility and gastric emptying.  If continues to have bloating, nausea and early satiety, may need to change to postpyloric feeding tube.  GI service will continue to follow this patient.  Total time 25 minutes  Nelida Meuse III Office: [phone]

## 2019-10-24 NOTE — Progress Notes (Signed)
PROGRESS NOTE    Kara [name]  OFB:[id number] DOB: [birth year] DOA: 10/16/2019 PCP: Lawerance Cruel, MD   Chef Complaints: Abdominal pain  Brief Narrative: 34 year old with history of hypertension, asthma, previously alcoholic who quit for 3 years and history of alcohol induced pancreatitis, cholecystectomy after an episode of pancreatitis last year, last exacerbation in May 2020 and none since then presents with worsening abdominal pain.  Ongoing for about 2 months but worse for 2 days. In the emergency room, tachycardic, afebrile, WBC 13.5.  Renal functions normal.  Calcium normal. MRI of the abdomen shows multiple uncomplicated pancreatic cyst and acute inflammation on the head of the pancreas.  Lipase is 128. Patient was admitted for further management.  Subjective: Patient was complaining of fullness and nausea and tube feeding have been all since 3 AM-was on tube feed 40 cc/h today. Remains on IV Dilaudid for pain/tightness on abdomen  Assessment & Plan:  Acute pancreatitis, recurrent, with multiple pancreatic, peripancreatic fluid collection, pseudocyst largest 7.1 X5.1X 5.8 cm: Alcohol was felt to be the etiology and previous pancreatitis but patient reports abstinence for 3 years, had lap chole in March 2020.  MRCP done here, refer to the report.  GI consulted 7/2 and managing following closely.Tube feeding was initiated but not tolerating rate, likely pseudocyst affecting gastric emptying, decreased to 20 cc/h and not to go above 30 cc/h as per GI, I vreglan. If still unable to tolerate may need to go for postpyloric tube.  Continue pain control with IV Dilaudid also on oral oxycodone but not eating much.  LFTs overall stable with elevated alk phos, lipase 60-90.  Hypoalbuminemia in the setting of pancreatitis: Augment nutrition with tube feeding as above.   Leukocytosis: It has resolved.  Polyneuropathy-continue home Neurontin.  Benign essential HTN: BP controlled on home  metoprolol  History of asthma, severe persistent: Respiratory status is stable. On room air and stable.  Hypomagnesemia/hypokalemia resolved.    Hepatomegaly/hepatic steatosis no evidence of cirrhosis. WILL NEED OP F/U WITH PCP/GI  Morbid obesity with BMI 44: will benefit with wt loss and healthy lifestyle.  Cont home:sertraline,seroquel and bupropion.    DVT prophylaxis: SCDs Start: 10/17/19 0608 Code Status:Full Family Communication: plan of care discussed with patient at bedside.  Status is: Inpatient  Remains inpatient appropriate because:IV treatments appropriate due to intensity of illness or inability to take PO and Inpatient level of care appropriate due to severity of illness  Dispo: The patient is from: [place], independent.              Anticipated d/c is to: Home              Anticipated d/c date is: >3 days days              Patient currently is not medically stable to d/c.  Awaiting for improvement of pancreatitis dietary/tube feed tolerance  Nutrition: Diet Order            Diet clear liquid Room service appropriate? Yes; Fluid consistency: Thin  Diet effective now                 Body mass index is 39.62 kg/m.  Consultants: Gastroenterology Procedures:see note Microbiology:see note  Medications: Scheduled Meds: . buPROPion  150 mg Oral Daily  . buPROPion  300 mg Oral QHS  . enoxaparin (LOVENOX) injection  60 mg Subcutaneous Q24H  . fluticasone furoate-vilanterol  1 puff Inhalation Daily  . gabapentin  900 mg Oral BID  .  melatonin  10 mg Oral QHS  . metoCLOPramide (REGLAN) injection  10 mg Intravenous Q8H  . metoprolol succinate  50 mg Oral Daily  . QUEtiapine  100 mg Oral QHS  . sertraline  75 mg Oral Daily  . umeclidinium bromide  1 puff Inhalation Daily   Continuous Infusions: . dextrose 5 % and 0.45% NaCl 125 mL/hr at 10/24/19 1220  . feeding supplement (OSMOLITE 1.5 CAL) 40 mL/hr at 10/23/19 1936    Antimicrobials: Anti-infectives  (From admission, onward)   None       Objective: Vitals: Today's Vitals   10/24/19 1038 10/24/19 1220 10/24/19 1250 10/24/19 1315  BP:    102/82  Pulse:    96  Resp:    19  Temp:    98.3 F (36.8 C)  TempSrc:    Oral  SpO2:    94%  Weight:      Height:      PainSc: _0 Intake/Output Summary (Last 24 hours) at 10/24/2019 1418 Last data filed at 10/24/2019 0853 Gross per 24 hour  Intake 267.5 ml  Output --  Net 267.5 ml   Filed Weights   10/22/19 0723 10/23/19 0500 10/24/19 0344  Weight: 113.8 kg 112.5 kg 108 kg   Weight change: -5.806 kg   Intake/Output from previous day: 07/06 0701 - 07/07 0700 In: 297.5 [NG/GT:297.5] Out: -  Intake/Output this shift: No intake/output data recorded.  Examination:  General exam: AAOx3 , NAD, weak appearing. HEENT:Oral mucosa moist, Ear/Nose WNL grossly, dentition normal. Respiratory system: bilaterally clear,no wheezing or crackles,no use of accessory muscle Cardiovascular system: S1 & S2 +, No JVD,. Gastrointestinal system: Abdomen soft, obese full abdomen, tightness/tender and voluntary guarding,ND,BS+. Nervous System:Alert, awake, moving extremities and grossly nonfocal Extremities: No edema, distal peripheral pulses palpable.  Skin: No rashes,no icterus. MSK: Normal muscle bulk,tone, power  Data Reviewed: I have personally reviewed following labs and imaging studies CBC: Recent Labs  Lab 10/18/19 0316 10/22/19 0440  WBC 7.3 6.8  NEUTROABS 4.3  --   HGB 10.5* 10.0*  HCT 33.7* 32.3*  MCV 92.1 92.6  PLT 258 703   Basic Metabolic Panel: Recent Labs  Lab 10/18/19 0316 10/18/19 0316 10/19/19 0323 10/20/19 0324 10/22/19 0440 10/23/19 0346 10/24/19 0333  NA 137   < > 136 137 135 137 137  K 3.5   < > 3.5 3.2* 4.0 3.5 4.1  CL 103   < > 100 101 98 99 99  CO2 24   < > _1 GLUCOSE 85   < > 113* 90 94 91 128*  BUN 6   < > 6 <5* <5* <5* <5*  CREATININE 0.61   < > 0.64 0.55 0.62 0.56 0.58   CALCIUM 8.1*   < > 8.2* 8.2* 8.5* 8.7* 9.3  MG 1.6*  --   --  1.9  --   --   --   PHOS 3.3  --   --   --   --   --   --    < > = values in this interval not displayed.   GFR: Estimated Creatinine Clearance: 119.9 mL/min (by C-G formula based on SCr of 0.58 mg/dL). Liver Function Tests: Recent Labs  Lab 10/19/19 0323 10/20/19 0324 10/22/19 0440 10/23/19 0346 10/24/19 0333  AST 51* 25 59* 32 25  ALT _2 ALKPHOS 373* 327* 475* 438* 399*  BILITOT  1.3* 1.0 0.8 0.9 0.7  PROT 6.3* 6.0* 6.3* 6.6 7.1  ALBUMIN 2.5* 2.4* 2.6* 2.6* 2.9*   Recent Labs  Lab 10/20/19 0324 10/21/19 0421 10/22/19 0440 10/23/19 0346 10/24/19 0333  LIPASE 68* 97* 89* 72* 77*   No results for input(s): AMMONIA in the last 168 hours. Coagulation Profile: Recent Labs  Lab 10/19/19 1309  INR 1.4*   Cardiac Enzymes: No results for input(s): CKTOTAL, CKMB, CKMBINDEX, TROPONINI in the last 168 hours. BNP (last 3 results) No results for input(s): PROBNP in the last 8760 hours. HbA1C: No results for input(s): HGBA1C in the last 72 hours. CBG: Recent Labs  Lab 10/23/19 1942 10/23/19 2327 10/24/19 0341 10/24/19 0823 10/24/19 1150  GLUCAP 93 126* 114* 100* 110*   Lipid Profile: No results for input(s): CHOL, HDL, LDLCALC, TRIG, CHOLHDL, LDLDIRECT in the last 72 hours. Thyroid Function Tests: No results for input(s): TSH, T4TOTAL, FREET4, T3FREE, THYROIDAB in the last 72 hours. Anemia Panel: No results for input(s): VITAMINB12, FOLATE, FERRITIN, TIBC, IRON, RETICCTPCT in the last 72 hours. Sepsis Labs: No results for input(s): PROCALCITON, LATICACIDVEN in the last 168 hours.  Recent Results (from the past 240 hour(s))  SARS Coronavirus 2 by RT PCR (hospital order, performed in [facility] hospital lab) Nasopharyngeal Nasopharyngeal Swab     Status: None   Collection Time: 10/17/19  3:38 AM   Specimen: Nasopharyngeal Swab  Result Value Ref Range Status   SARS Coronavirus 2 NEGATIVE  NEGATIVE Final    Comment: (NOTE) SARS-CoV-2 target nucleic acids are NOT DETECTED.  The SARS-CoV-2 RNA is generally detectable in upper and lower respiratory specimens during the acute phase of infection. The lowest concentration of SARS-CoV-2 viral copies this assay can detect is 250 copies / mL. A negative result does not preclude SARS-CoV-2 infection and should not be used as the sole basis for treatment or other patient management decisions.  A negative result may occur with improper specimen collection / handling, submission of specimen other than nasopharyngeal swab, presence of viral mutation(s) within the areas targeted by this assay, and inadequate number of viral copies (<250 copies / mL). A negative result must be combined with clinical observations, patient history, and epidemiological information.  Fact Sheet for Patients:   StrictlyIdeas.no  Fact Sheet for Healthcare Providers: BankingDealers.co.za  This test is not yet approved or  cleared by the [place] FDA and has been authorized for detection and/or diagnosis of SARS-CoV-2 by FDA under an Emergency Use Authorization (EUA).  This EUA will remain in effect (meaning this test can be used) for the duration of the COVID-19 declaration under Section 564(b)(1) of the Act, 21 U.S.C. section 360bbb-3(b)(1), unless the authorization is terminated or revoked sooner.  Performed at [facility], [place] [address] [place], [place] [address]     Radiology Studies: No results found.   LOS: 7 days   Antonieta Pert, MD [facility]  10/24/2019, 2:18 PM

## 2019-10-24 NOTE — Progress Notes (Signed)
Pt c/o fullness from tube feed and nausea. RN paged on call about turning down rate. On call stated to turn down rate or turn off until morning shift. RN turned off tube feed and will pass on to day shift RN.

## 2019-10-24 NOTE — Plan of Care (Signed)
  Problem: Activity: Goal: Risk for activity intolerance will decrease Outcome: Progressing   Problem: Coping: Goal: Level of anxiety will decrease Outcome: Progressing   Problem: Nutrition: Goal: Adequate nutrition will be maintained Outcome: Progressing   

## 2019-10-25 ENCOUNTER — Inpatient Hospital Stay (HOSPITAL_COMMUNITY): Payer: BC Managed Care – PPO

## 2019-10-25 ENCOUNTER — Encounter (HOSPITAL_COMMUNITY): Payer: Self-pay | Admitting: Internal Medicine

## 2019-10-25 LAB — COMPREHENSIVE METABOLIC PANEL
ALT: 12 U/L (ref 0–44)
AST: 15 U/L (ref 15–41)
Albumin: 2.5 g/dL — ABNORMAL LOW (ref 3.5–5.0)
Alkaline Phosphatase: 307 U/L — ABNORMAL HIGH (ref 38–126)
Anion gap: 9 (ref 5–15)
BUN: <5 mg/dL — ABNORMAL LOW (ref 6–20)
CO2: 28 mmol/L (ref 22–32)
Calcium: 8.7 mg/dL — ABNORMAL LOW (ref 8.9–10.3)
Chloride: 99 mmol/L (ref 98–111)
Creatinine, Ser: 0.54 mg/dL (ref 0.44–1.00)
GFR calc Af Amer: >60 mL/min (ref >60)
GFR calc non Af Amer: >60 mL/min (ref >60)
Glucose, Bld: 119 mg/dL — ABNORMAL HIGH (ref 70–99)
Potassium: 3.5 mmol/L (ref 3.5–5.1)
Sodium: 136 mmol/L (ref 135–145)
Total Bilirubin: 0.6 mg/dL (ref 0.3–1.2)
Total Protein: 6.3 g/dL — ABNORMAL LOW (ref 6.5–8.1)

## 2019-10-25 LAB — CBC
HCT: 31.5 % — ABNORMAL LOW (ref 36.0–46.0)
Hemoglobin: 9.6 g/dL — ABNORMAL LOW (ref 12.0–15.0)
MCH: 28.2 pg (ref 26.0–34.0)
MCHC: 30.5 g/dL (ref 30.0–36.0)
MCV: 92.4 fL (ref 80.0–100.0)
Platelets: 252 10*3/uL (ref 150–400)
RBC: 3.41 MIL/uL — ABNORMAL LOW (ref 3.87–5.11)
RDW: 13.7 % (ref 11.5–15.5)
WBC: 7.2 10*3/uL (ref 4.0–10.5)
nRBC: 0 % (ref 0.0–0.2)

## 2019-10-25 LAB — GLUCOSE, CAPILLARY
Glucose-Capillary: 113 mg/dL — ABNORMAL HIGH (ref 70–99)
Glucose-Capillary: 115 mg/dL — ABNORMAL HIGH (ref 70–99)
Glucose-Capillary: 121 mg/dL — ABNORMAL HIGH (ref 70–99)
Glucose-Capillary: 89 mg/dL (ref 70–99)
Glucose-Capillary: 90 mg/dL (ref 70–99)
Glucose-Capillary: 96 mg/dL (ref 70–99)

## 2019-10-25 LAB — LIPASE, BLOOD: Lipase: 67 U/L — ABNORMAL HIGH (ref 11–51)

## 2019-10-25 MED ORDER — BUTAMBEN-TETRACAINE-BENZOCAINE 2-2-14 % EX AERO
INHALATION_SPRAY | CUTANEOUS | Status: AC
  Filled 2019-10-25: qty 20

## 2019-10-25 MED ORDER — IOHEXOL 300 MG/ML  SOLN
100.0000 mL | Freq: Once | INTRAMUSCULAR | Status: AC | PRN
  Administered 2019-10-25: 100 mL via INTRAVENOUS

## 2019-10-25 MED ORDER — OSMOLITE 1.5 CAL PO LIQD
1000.0000 mL | ORAL | Status: DC
  Filled 2019-10-25 (×5): qty 1000

## 2019-10-25 MED ORDER — LIDOCAINE HCL URETHRAL/MUCOSAL 2 % EX GEL
CUTANEOUS | Status: AC
  Filled 2019-10-25: qty 30

## 2019-10-25 MED ORDER — IOHEXOL 9 MG/ML PO SOLN
ORAL | Status: AC
  Administered 2019-10-25: 500 mL
  Filled 2019-10-25: qty 1000

## 2019-10-25 MED ORDER — IOHEXOL 12 MG/ML PO SOLN
500.0000 mL | ORAL | Status: AC
  Administered 2019-10-25: 500 mL via ORAL

## 2019-10-25 NOTE — Progress Notes (Signed)
PROGRESS NOTE    Kara [name]  PYP:[id number] DOB: March 17, 1986 DOA: 10/16/2019 PCP: [name], MD   Chef Complaints: Abdominal pain  Brief Narrative: 34 year old with history of hypertension, asthma, previously alcoholic who quit for 3 years and history of alcohol induced pancreatitis, cholecystectomy after an episode of pancreatitis last year, last exacerbation in May 2020 and none since then presents with worsening abdominal pain.  Ongoing for about 2 months but worse for 2 days. In the emergency room, tachycardic, afebrile, WBC 13.5.  Renal functions normal.  Calcium normal. MRI of the abdomen shows multiple uncomplicated pancreatic cyst and acute inflammation on the head of the pancreas.  Lipase is 128. Patient was admitted for further management.  Subjective: Seen this morning she was alert awake resting comfortably not in acute distress working with her laptop. Complaint of vomiting this morning tube feeding was stopped.  Complains of abdominal fullness.  Assessment & Plan:  Acute pancreatitis, recurrent, with multiple pancreatic, peripancreatic fluid collection, pseudocyst largest 7.1 X5.1X 5.8 cm: Alcohol was felt to be the etiology and previous pancreatitis but patient reports abstinence for 3 years, had lap chole in March 2020.  MRCP done here, refer to the report.  GI consulted 7/2 and appreciate input.  IgG4 normal, LFTs lipase overall stable , albumin remains low. Tube feed was slowed 7/7, but 7/8 AM having vomiting and colitis.  Patient with ongoing symptoms. plan is to have a CT abdomen pelvis given ongoing symptoms and postpyloric enteral feeds after putting new NG tube by IR.  Remains on IV fluids, IV Dilaudid for pain control.  Calcium lipase BMP CBC remains stable.  Hypoalbuminemia in the setting of pancreatitis: Augment nutrition with tube feeding as above.   Leukocytosis: Resolved.  Polyneuropathy-continue home Neurontin.  Benign essential HTN: BP is  controlled on metoprolol.    History of asthma, severe persistent: Respiratory status is stable. On room air and stable.  Hypomagnesemia/hypokalemia resolved.    Hepatomegaly/hepatic steatosis no evidence of cirrhosis.  Will need outpatient follow-up.  Morbid obesity with BMI 44: BMI at 41.  Will benefit with weight loss healthy lifestyle.   Cont home:sertraline,seroquel and bupropion.    DVT prophylaxis: SCDs Start: 10/17/19 0608 Code Status:Full Family Communication: plan of care discussed with patient at bedside.  Status is: Inpatient  Remains inpatient appropriate because:IV treatments appropriate due to intensity of illness or inability to take PO and Inpatient level of care appropriate due to severity of illness  Dispo: The patient is from: Home Home, independent.              Anticipated d/c is to: Home              Anticipated d/c date is: >3 days days              Patient currently is not medically stable to d/c.  Awaiting for diet tolerance improvement pancreatitis.   Nutrition: Diet Order            Diet NPO time specified  Diet effective now                 Body mass index is 41.31 kg/m.  Consultants: Gastroenterology Procedures:see note Microbiology:see note  Medications: Scheduled Meds: . buPROPion  150 mg Oral Daily  . buPROPion  300 mg Oral QHS  . butamben-tetracaine-benzocaine      . enoxaparin (LOVENOX) injection  60 mg Subcutaneous Q24H  . feeding supplement (OSMOLITE 1.5 CAL)  1,000 mL Per Tube Q24H  .  fluticasone furoate-vilanterol  1 puff Inhalation Daily  . gabapentin  900 mg Oral BID  . lidocaine      . melatonin  10 mg Oral QHS  . metoCLOPramide (REGLAN) injection  10 mg Intravenous Q8H  . metoprolol succinate  50 mg Oral Daily  . QUEtiapine  100 mg Oral QHS  . sertraline  75 mg Oral Daily  . umeclidinium bromide  1 puff Inhalation Daily   Continuous Infusions: . dextrose 5 % and 0.45% NaCl 125 mL/hr at 10/25/19 3086     Antimicrobials: Anti-infectives (From admission, onward)   None       Objective: Vitals: Today's Vitals   10/25/19 0754 10/25/19 0841 10/25/19 0900 10/25/19 1101  BP:      Pulse:      Resp:      Temp:      TempSrc:      SpO2: 92%     Weight:      Height:      PainSc:  7  5  8      Intake/Output Summary (Last 24 hours) at 10/25/2019 1206 Last data filed at 10/25/2019 0300 Gross per 24 hour  Intake 2024 ml  Output --  Net 2024 ml   Filed Weights   10/23/19 0500 10/24/19 0344 10/25/19 0734  Weight: 112.5 kg 108 kg 112.6 kg   Weight change:    Intake/Output from previous day: 07/07 0701 - 07/08 0700 In: 2024 [I.V.:1828.7; NG/GT:195.3] Out: -  Intake/Output this shift: No intake/output data recorded.  Examination:  General exam: AAOx3, pleasant not in acute distress walking with a left, NGT+, NAD, weak appearing. HEENT:Oral mucosa moist, Ear/Nose WNL grossly, dentition normal. Respiratory system: bilaterally clear,no wheezing or crackles,no use of accessory muscle Cardiovascular system: S1 & S2 +, No JVD,. Gastrointestinal system: Abdomen soft, obese, voluntary guarding present, BS+ Nervous System:Alert, awake, moving extremities and grossly nonfocal Extremities: No edema, distal peripheral pulses palpable.  Skin: No rashes,no icterus. MSK: Normal muscle bulk,tone, power   Data Reviewed: I have personally reviewed following labs and imaging studies CBC: Recent Labs  Lab 10/22/19 0440 10/25/19 0300  WBC 6.8 7.2  HGB 10.0* 9.6*  HCT 32.3* 31.5*  MCV 92.6 92.4  PLT 286 252   Basic Metabolic Panel: Recent Labs  Lab 10/20/19 0324 10/22/19 0440 10/23/19 0346 10/24/19 0333 10/25/19 0300  NA 137 135 137 137 136  K 3.2* 4.0 3.5 4.1 3.5  CL 101 98 99 99 99  CO2 28 27 27 27 28   GLUCOSE 90 94 91 128* 119*  BUN <5* <5* <5* <5* <5*  CREATININE 0.55 0.62 0.56 0.58 0.54  CALCIUM 8.2* 8.5* 8.7* 9.3 8.7*  MG 1.9  --   --   --   --    GFR: Estimated  Creatinine Clearance: 122.7 mL/min (by C-G formula based on SCr of 0.54 mg/dL). Liver Function Tests: Recent Labs  Lab 10/20/19 0324 10/22/19 0440 10/23/19 0346 10/24/19 0333 10/25/19 0300  AST 25 59* 32 25 15  ALT 13 21 16 14 12   ALKPHOS 327* 475* 438* 399* 307*  BILITOT 1.0 0.8 0.9 0.7 0.6  PROT 6.0* 6.3* 6.6 7.1 6.3*  ALBUMIN 2.4* 2.6* 2.6* 2.9* 2.5*   Recent Labs  Lab 10/21/19 0421 10/22/19 0440 10/23/19 0346 10/24/19 0333 10/25/19 0300  LIPASE 97* 89* 72* 77* 67*   No results for input(s): AMMONIA in the last 168 hours. Coagulation Profile: Recent Labs  Lab 10/19/19 1309  INR 1.4*   Cardiac Enzymes:  No results for input(s): CKTOTAL, CKMB, CKMBINDEX, TROPONINI in the last 168 hours. BNP (last 3 results) No results for input(s): PROBNP in the last 8760 hours. HbA1C: No results for input(s): HGBA1C in the last 72 hours. CBG: Recent Labs  Lab 10/24/19 1605 10/24/19 1920 10/25/19 0005 10/25/19 0326 10/25/19 0732  GLUCAP 105* 101* 121* 115* 113*   Lipid Profile: No results for input(s): CHOL, HDL, LDLCALC, TRIG, CHOLHDL, LDLDIRECT in the last 72 hours. Thyroid Function Tests: No results for input(s): TSH, T4TOTAL, FREET4, T3FREE, THYROIDAB in the last 72 hours. Anemia Panel: No results for input(s): VITAMINB12, FOLATE, FERRITIN, TIBC, IRON, RETICCTPCT in the last 72 hours. Sepsis Labs: No results for input(s): PROCALCITON, LATICACIDVEN in the last 168 hours.  Recent Results (from the past 240 hour(s))  SARS Coronavirus 2 by RT PCR (hospital order, performed in [facility] hospital lab) Nasopharyngeal Nasopharyngeal Swab     Status: None   Collection Time: 10/17/19  3:38 AM   Specimen: Nasopharyngeal Swab  Result Value Ref Range Status   SARS Coronavirus 2 NEGATIVE NEGATIVE Final    Comment: (NOTE) SARS-CoV-2 target nucleic acids are NOT DETECTED.  The SARS-CoV-2 RNA is generally detectable in upper and lower respiratory specimens during the acute  phase of infection. The lowest concentration of SARS-CoV-2 viral copies this assay can detect is 250 copies / mL. A negative result does not preclude SARS-CoV-2 infection and should not be used as the sole basis for treatment or other patient management decisions.  A negative result may occur with improper specimen collection / handling, submission of specimen other than nasopharyngeal swab, presence of viral mutation(s) within the areas targeted by this assay, and inadequate number of viral copies (<250 copies / mL). A negative result must be combined with clinical observations, patient history, and epidemiological information.  Fact Sheet for Patients:   BoilerBrush.com.cy  Fact Sheet for Healthcare Providers: https://pope.com/  This test is not yet approved or  cleared by the [place] FDA and has been authorized for detection and/or diagnosis of SARS-CoV-2 by FDA under an Emergency Use Authorization (EUA).  This EUA will remain in effect (meaning this test can be used) for the duration of the COVID-19 declaration under Section 564(b)(1) of the Act, 21 U.S.C. section 360bbb-3(b)(1), unless the authorization is terminated or revoked sooner.  Performed at [facility], [address] [place], [place] [address]     Radiology Studies: No results found.   LOS: 8 days   Lanae Boast, MD [facility]  10/25/2019, 12:06 PM

## 2019-10-25 NOTE — Progress Notes (Signed)
Pt c/o of nausea and vomiting and requested tube feeding to be stopped. RN stopped tube feeding and notified MD for further orders. Compazine push administered, see MAR.

## 2019-10-25 NOTE — Progress Notes (Signed)
RN notified Meghan in Radiology of pt completion of oral contrast for CT of abd/pelvis.

## 2019-10-25 NOTE — Progress Notes (Signed)
Nutrition Follow-up  DOCUMENTATION CODES:   Obesity unspecified, Non-severe (moderate) malnutrition in context of acute illness/injury  INTERVENTION:   -Recommend tube advancement to post-pyloric  Tube feeding recommendations: -Osmolite 1.5 @ 20 ml/hr, advance by 10 ml every 24 hours to goal rate of 55 ml/hr. -This regimen provides 1980 kcals, 82g protein and 1005 ml H2O.  Monitor magnesium, potassium, and phosphorus daily for at least 3 days, MD to replete as needed, as pt is at risk for refeeding syndrome.  NUTRITION DIAGNOSIS:   Moderate Malnutrition related to acute illness (acute pancreatitis) as evidenced by energy intake < or equal to 75% for > or equal to 1 month, mild fat depletion, mild muscle depletion.  Ongoing.  GOAL:   Patient will meet greater than or equal to 90% of their needs  Not meeting, TF off.  MONITOR:   PO intake, Labs, Weight trends, I & O's, TF tolerance, Diet advancement  ASSESSMENT:   34 year old with history of hypertension, asthma, previously alcoholic who quit for 3 years and history of alcohol induced pancreatitis, cholecystectomy after an episode of pancreatitis last year, last exacerbation in May 2020 and none since then presents with worsening abdominal pain.  Ongoing for about 2 months but worse for 2 days.  Patient not tolerating Osmolite 1.5 @ 20 ml/hr. On 7/6, pt's TF had been advanced to 40 ml/hr and then stopped after pt was having N/V. GI suspected poor gastric emptying. Per chart review, GI had ordered IV Reglan yesterday but medication was not started until ~2157.  Tube may need to be advanced to post-pyloric (tip is currently in the stomach). Recommend if tube feeding resumed, advancing slower every 24 hours instead of initial order of 12 hours.  Will continue to monitor for progress and tolerance.  Admission weight: 253 lbs. Current weight: 248 lbs.  Medications:  IV Reglan, D5 infusion, Compazine Labs reviewed: CBGs:  71-92  Diet Order:   Diet Order            Diet clear liquid Room service appropriate? Yes; Fluid consistency: Thin  Diet effective now                 EDUCATION NEEDS:   Education needs have been addressed  Skin:  Skin Assessment: Reviewed RN Assessment  Last BM:  7/4  Height:   Ht Readings from Last 1 Encounters:  10/21/19 5\' 5"  (1.651 m)    Weight:   Wt Readings from Last 1 Encounters:  10/25/19 112.6 kg   BMI:  Body mass index is 41.31 kg/m.  Estimated Nutritional Needs:   Kcal:  2000-2200  Protein:  75-90g  Fluid:  2L/day  12/26/19, MS, RD, LDN Inpatient Clinical Dietitian Contact information available via Amion

## 2019-10-25 NOTE — Progress Notes (Signed)
Oncoming RN noted Panda tube placement malposition. RN consulted ICU charge RN to come assess. May require follow up with MD and radiology since was placed in radiology.

## 2019-10-25 NOTE — Progress Notes (Addendum)
RR nurse at the bedside as we both assessed pts PANDA tube in the L nares is at 15-20cm slipping thru the tube clamp. Tube was removed by Kennon Rounds text page sent to [facility] on call and also sent to [facility]. Returned call from Dr Wendall Papa GI to inform him of situation.  Pt is tearful and would like to have it replaced tomorrow & not tonight.

## 2019-10-25 NOTE — Progress Notes (Signed)
Patient ID: Kara [name], female   DOB: 04-19-1986, 34 y.o.   MRN: [id number]    Progress Note   Subjective   Day # 8  CC; recurrent acute pancreatitis with pseudocysts  Tube feeds restarted yesterday, patient with nausea and vomiting this morning and tube feeds stopped-tube feeds had been restarted late yesterday afternoon and tolerated okay until About 9 AM with Nausea and Vomiting.  Pain about the same, still fairly severe.  Lipase 67 WBC 7.2/hemoglobin 9.6 LFTs normal with exception of alk phos 307    Objective   Vital signs in last 24 hours: Temp:  [98.1 F (36.7 C)-98.7 F (37.1 C)] 98.7 F (37.1 C) (07/08 0510) Pulse Rate:  [92-106] 92 (07/08 0510) Resp:  [16-19] 18 (07/08 0510) BP: (101-123)/(76-82) 101/76 (07/08 0510) SpO2:  [92 %-94 %] 92 % (07/08 0754) Weight:  [112.6 kg] 112.6 kg (07/08 0734) Last BM Date: 10/24/19 General:    white female in NAD Heart:  Regular rate and rhythm; no murmurs Lungs: Respirations even and unlabored, lungs CTA bilaterally Abdomen:  Soft, quite tender across the upper abdomen no rebound, nondistended. Normal bowel sounds. Extremities:  Without edema. Neurologic:  Alert and oriented,  grossly normal neurologically. Psych:  Cooperative. Normal mood and affect.  Intake/Output from previous day: 07/07 0701 - 07/08 0700 In: 2024 [I.V.:1828.7; NG/GT:195.3] Out: -  Intake/Output this shift: No intake/output data recorded.  Lab Results: Recent Labs    10/25/19 0300  WBC 7.2  HGB 9.6*  HCT 31.5*  PLT 252   BMET Recent Labs    10/23/19 0346 10/24/19 0333 10/25/19 0300  NA 137 137 136  K 3.5 4.1 3.5  CL 99 99 99  CO2 '27 27 28  '$ GLUCOSE 91 128* 119*  BUN <5* <5* <5*  CREATININE 0.56 0.58 0.54  CALCIUM 8.7* 9.3 8.7*   LFT Recent Labs    10/25/19 0300  PROT 6.3*  ALBUMIN 2.5*  AST 15  ALT 12  ALKPHOS 307*  BILITOT 0.6   PT/INR No results for input(s): LABPROT, INR in the last 72 hours.  Studies/Results: No  results found.     Assessment / Plan:    #63 34 year old white female with recurrent subacute pancreatitis admitted 8 days ago after being ill at home for at least 2 weeks with an inability to take p.o.'s.  Multiple prior episodes of pancreatitis related to EtOH, has been abstinent over the past 3 years. Imaging on admit showed multiple small developing pseudocysts.  She has been unable to tolerate gastric tube feedings, even at low rate and had nausea and vomiting this morning.  Suspect significant decrease gastric motility, and/or some extrinsic inflammatory changes involving the stomach.  Will DC Dobbhoff Sent to IR for postpyloric feeding tube Schedule for CT of the abdomen and pelvis today to reassess developing pseudocysts Otherwise continue current regimen.         Principal Problem:   Acute pancreatitis Active Problems:   Polyneuropathy   Benign essential HTN   Asthma, severe persistent, well-controlled   Pancreatic pseudocyst/cyst   Fatty liver   Malnutrition of moderate degree   Pancreatic pseudocyst     LOS: 8 days   Jerimiah Wolman PA-C 10/25/2019, 9:50 AM

## 2019-10-26 DIAGNOSIS — R748 Abnormal levels of other serum enzymes: Secondary | ICD-10-CM

## 2019-10-26 LAB — COMPREHENSIVE METABOLIC PANEL
ALT: 11 U/L (ref 0–44)
AST: 15 U/L (ref 15–41)
Albumin: 2.5 g/dL — ABNORMAL LOW (ref 3.5–5.0)
Alkaline Phosphatase: 263 U/L — ABNORMAL HIGH (ref 38–126)
Anion gap: 9 (ref 5–15)
BUN: <5 mg/dL — ABNORMAL LOW (ref 6–20)
CO2: 26 mmol/L (ref 22–32)
Calcium: 8.4 mg/dL — ABNORMAL LOW (ref 8.9–10.3)
Chloride: 99 mmol/L (ref 98–111)
Creatinine, Ser: 0.61 mg/dL (ref 0.44–1.00)
GFR calc Af Amer: >60 mL/min (ref >60)
GFR calc non Af Amer: >60 mL/min (ref >60)
Glucose, Bld: 109 mg/dL — ABNORMAL HIGH (ref 70–99)
Potassium: 3.7 mmol/L (ref 3.5–5.1)
Sodium: 134 mmol/L — ABNORMAL LOW (ref 135–145)
Total Bilirubin: 1.1 mg/dL (ref 0.3–1.2)
Total Protein: 6.1 g/dL — ABNORMAL LOW (ref 6.5–8.1)

## 2019-10-26 LAB — GAMMA GT: GGT: 222 U/L — ABNORMAL HIGH (ref 7–50)

## 2019-10-26 LAB — GLUCOSE, CAPILLARY
Glucose-Capillary: 103 mg/dL — ABNORMAL HIGH (ref 70–99)
Glucose-Capillary: 105 mg/dL — ABNORMAL HIGH (ref 70–99)
Glucose-Capillary: 114 mg/dL — ABNORMAL HIGH (ref 70–99)
Glucose-Capillary: 120 mg/dL — ABNORMAL HIGH (ref 70–99)
Glucose-Capillary: 122 mg/dL — ABNORMAL HIGH (ref 70–99)
Glucose-Capillary: 123 mg/dL — ABNORMAL HIGH (ref 70–99)
Glucose-Capillary: 181 mg/dL — ABNORMAL HIGH (ref 70–99)

## 2019-10-26 LAB — CBC
HCT: 32.1 % — ABNORMAL LOW (ref 36.0–46.0)
Hemoglobin: 9.7 g/dL — ABNORMAL LOW (ref 12.0–15.0)
MCH: 28 pg (ref 26.0–34.0)
MCHC: 30.2 g/dL (ref 30.0–36.0)
MCV: 92.5 fL (ref 80.0–100.0)
Platelets: 239 10*3/uL (ref 150–400)
RBC: 3.47 MIL/uL — ABNORMAL LOW (ref 3.87–5.11)
RDW: 13.7 % (ref 11.5–15.5)
WBC: 7.7 10*3/uL (ref 4.0–10.5)
nRBC: 0 % (ref 0.0–0.2)

## 2019-10-26 LAB — LIPASE, BLOOD: Lipase: 52 U/L — ABNORMAL HIGH (ref 11–51)

## 2019-10-26 NOTE — Progress Notes (Signed)
Progress Note   Subjective  Patient states she does have an appetite to start liquids.  She is feeling a bit better.  She had IR guided NG tube placed postpylorus yesterday, however tube fell out a few hours after placement. CT scan yesterday showed improvement of pancreatitis and fluid collections.    Objective   Vital signs in last 24 hours: Temp:  [98 F (36.7 C)-98.8 F (37.1 C)] 98.8 F (37.1 C) (07/09 0405) Pulse Rate:  [99-110] 110 (07/09 0405) Resp:  [16-20] 20 (07/09 0405) BP: (104-111)/(56-84) 111/56 (07/09 0405) SpO2:  [93 %-99 %] 96 % (07/09 0811) Weight:  [113.9 kg] 113.9 kg (07/09 0500) Last BM Date: 10/25/19 General:    white female in NAD, pleasant Abdomen:  Soft, some upper abdominal tenderness to palpation.  Extremities:  Without edema. Neurologic:  Alert and oriented,  grossly normal neurologically. Psych:  Cooperative. Normal mood and affect.  Intake/Output from previous day: No intake/output data recorded. Intake/Output this shift: No intake/output data recorded.  Lab Results: Recent Labs    10/25/19 0300 10/26/19 0322  WBC 7.2 7.7  HGB 9.6* 9.7*  HCT 31.5* 32.1*  PLT 252 239   BMET Recent Labs    10/24/19 0333 10/25/19 0300 10/26/19 0322  NA 137 136 134*  K 4.1 3.5 3.7  CL 99 99 99  CO2 27 28 26   GLUCOSE 128* 119* 109*  BUN <5* <5* <5*  CREATININE 0.58 0.54 0.61  CALCIUM 9.3 8.7* 8.4*   LFT Recent Labs    10/26/19 0322  PROT 6.1*  ALBUMIN 2.5*  AST 15  ALT 11  ALKPHOS 263*  BILITOT 1.1   PT/INR No results for input(s): LABPROT, INR in the last 72 hours.  Studies/Results: CT ABDOMEN PELVIS W CONTRAST  Result Date: 10/25/2019 CLINICAL DATA:  34 year old female with acute pancreatitis, nausea vomiting. EXAM: CT ABDOMEN AND PELVIS WITH CONTRAST TECHNIQUE: Multidetector CT imaging of the abdomen and pelvis was performed using the standard protocol following bolus administration of intravenous contrast. CONTRAST:  31  OMNIPAQUE IOHEXOL 300 MG/ML  SOLN COMPARISON:  CT abdomen pelvis dated 10/16/2019. FINDINGS: Lower chest: There is a small left pleural effusion. There is partial consolidative changes of the left lung base which may represent atelectasis or infiltrate. Clinical correlation is recommended. No intra-abdominal free air. Upper abdominal edema and small ascites. Hepatobiliary: Fatty infiltration of the liver. No intrahepatic biliary ductal dilatation. Cholecystectomy. No retained calcified stone noted in the central CBD. Pancreas: Inflammatory changes of the pancreas in keeping with known acute pancreatitis. Small focal ill-defined hypodense area in the uncinate process and distal body/tail of the pancreas appears similar to prior CT. These measure up to approximately 13 mm in the distal pancreas and likely represent focal area of edema or small pseudocysts. There is overall pancreatic atrophy for the patient's age. Diffuse upper abdominal edema has progressed since the prior CT. There is however interval decrease in the size of a loculated collection or pseudocyst in the left upper abdomen now measuring approximately 3.9 x 2.7 cm (previously 7.1 x 5.1 cm. The second fluid collection seen anterior to the stomach and the prior CT is not visualized on today's exam. An additional loculated collection along the left hemidiaphragm has decreased in size now measuring 2.7 x 1.6 cm (14/2). Spleen: Top-normal spleen size. Adrenals/Urinary Tract: The adrenal glands are grossly unremarkable. There is no hydronephrosis on either side. The visualized ureters and urinary bladder appear unremarkable. Stomach/Bowel: There is inflammatory  changes and thickening of the distal transverse colon likely reactive to inflammatory changes of the pancreas. There is no bowel obstruction. The appendix is normal. Vascular/Lymphatic: The abdominal aorta and IVC are unremarkable. The SMV and main portal vein are patent. 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 No portal venous gas. There is no adenopathy. Reproductive: The uterus is grossly unremarkable. No adnexal masses. Other: Loculated fluid collection in the left mid abdomen anterior to the psoas muscle similar to prior CT. Attention on follow-up imaging recommended. Musculoskeletal: No acute or significant osseous findings. IMPRESSION: 1. Acute pancreatitis with overall interval decrease in the size of the loculated collections or pseudocyst in the left upper abdomen. 2. 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 3. Small left pleural effusion with partial consolidative changes of the left lung base which may represent atelectasis or infiltrate. Clinical correlation is recommended. 4. Fatty liver. 5. No bowel obstruction. Normal appendix. 6. Segmental inflammatory changes and thickening of the distal transverse colon likely reactive to acute pancreatitis. Electronically Signed   By: Elgie Collard M.D.   On: 10/25/2019 19:22   DG Basil Dess Tube Plc W/Fl W/Rad  Result Date: 10/25/2019 INDICATION: 34 year old female with pancreatitis. Request was made to reposition the patient's feeding tube in a post pyloric position. EXAM: RF-NASOGASTRIC TUBE PLACEMENT WITH FL AND WITH RAD CONTRAST:  No contrast was used during the examination. MEDICATIONS: Viscous lidocaine. ANESTHESIA/SEDATION: None. FLUOROSCOPY TIME:  Fluoroscopy Time: 1.4 sec Radiation Exposure Index (if provided by the fluoroscopic device): 45.10 mGy Number of Acquired Spot Images: 2 COMPLICATIONS: None immediate. PROCEDURE: The patient's existing feeding tube was removed due to poor condition of the outside coding. A new 10 [place] Dobhoff tube was lubricated with viscous lidocaine and inserted into the left nostril. Under intermittent fluoroscopic guidance, the Dobhoff tube was advanced through the stomach, through the duodenum with tip ultimately terminating over the third portion of the  duodenum. A spot fluoroscopic image was saved for documentation purposes. The tube was affixed to the patient's nose with tape. The patient tolerated the procedure well without immediate postprocedural complication. IMPRESSION: Satisfactory placement of a nasogastric tube with tip in the third portion of the duodenum. Electronically Signed   By: Emmaline Kluver M.D.   On: 10/25/2019 12:42       Assessment / Plan:   34 year old female admitted with recurrent pancreatitis 9 days ago.  She has a history of multiple prior episodes of pancreatitis related to alcohol but has not had any alcohol in recent years.  She is status post cholecystectomy with normal triglycerides, unclear cause of this recent episode.  IgG4 normal.  She denies any recent medications that would have caused this.  She is getting more of an appetite and wishes to try clear liquids today, and if she can do this would progress to full liquids as tolerated.  She has not done well with NG tube placement, fell out after few hours yesterday and she is hoping to avoid another tube placement.  Discussed with her that this will be prolonged recovery but she does appear to be getting better based on CT scan imaging and clinically today.  Discussed goals of discharge are being able to tolerate oral nutrition and pain control on oral medications, she thinks she is making some progress in regards to her pain.  She has had an elevated alkaline phosphatase during this course however imaging has not shown any clear cause for that.  MRCP  without any biliary ductal dilation, hepatic steatosis noted.  We will check GGT to clarify if this is hepatic in etiology, further work-up if needed.  We will continue to follow, call with questions.  Dr. Chales Abrahams will be covering this patient this weekend.  Ileene Patrick, MD [facility]

## 2019-10-26 NOTE — Progress Notes (Signed)
PROGRESS NOTE    Kara [name]  ZOX:[id number] DOB: 03-17-1986 DOA: 10/16/2019 PCP: [name], MD   Chef Complaints: Abdominal pain  Brief Narrative: 34 year old with history of hypertension, asthma, previously alcoholic who quit for 3 years and history of alcohol induced pancreatitis, cholecystectomy after an episode of pancreatitis last year, last exacerbation in May 2020 and none since then presents with worsening abdominal pain.  Ongoing for about 2 months but worse for 2 days. In the emergency room, tachycardic, afebrile, WBC 13.5.  Renal functions normal.  Calcium normal. MRI of the abdomen shows multiple uncomplicated pancreatic cyst and acute inflammation on the head of the pancreas.  Lipase is 128. Patient was admitted for further management.  Subjective: Seen this morning again she was resting comfortably working on her laptop this morning. IR guided NG tube fell off yesterday Patient continues to require IV Dilaudid for pain.  Assessment & Plan:  Acute pancreatitis, recurrent, with multiple pancreatic, peripancreatic fluid collection, pseudocyst largest 7.1 X5.1X 5.8 cm: Alcohol was felt to be the etiology and previous pancreatitis but patient reports abstinence for 3 years, had lap chole in March 2020.  MRCP done here, refer to the report.  GI consulted 7/2 and appreciate input.  IgG4 normal, LFTs lipase overall stable , albumin remains low. Tube feed was slowed 7/7, but 7/8 AM having vomiting and colitis.  Due to ongoing symptoms and difficulty to tolerate tube feeding repeat CT abdomen was ordered 7/8 which shows improving pancreatitis and fluid collection.  IR guided to fail of 7/8 evening.  We will continue on IV fluids pain control,  And oral and and IV pain management, encouraged to use oral so that she can be weaned off IV pain medication.  Continue on l diet as per GI .Checking GGT to rule out liver source   Hypoalbuminemia in the setting of pancreatitis: Augment  nutrition as above.  .   Leukocytosis: Resolved.  Polyneuropathy-continue home Neurontin.  Benign essential HTN: BP controlled on metoprolol.    History of asthma, severe persistent: Not wheezing,  Hypomagnesemia/hypokalemia resolved will monitor, intermittently  Hepatomegaly/hepatic steatosis no evidence of cirrhosis.  Will need outpatient follow-up.  Morbid obesity with BMI 44: BMI at 41.  Will benefit with weight loss healthy lifestyle.   Cont home:sertraline,seroquel and bupropion.    DVT prophylaxis: SCDs Start: 10/17/19 0608 Code Status:Full Family Communication: plan of care discussed with patient at bedside.  Status is: Inpatient  Remains inpatient appropriate because:IV treatments appropriate due to intensity of illness or inability to take PO and Inpatient level of care appropriate due to severity of illness  Dispo: The patient is from: Home Home, independent.              Anticipated d/c is to: Home              Anticipated d/c date is:2 DAYS              Patient currently is not medically stable to d/c.  Will plan discharge once pain is an oral regimen and tolerating diet and okay GI  Nutrition: Diet Order            Diet NPO time specified  Diet effective now                 Body mass index is 41.79 kg/m.  Consultants: Gastroenterology Procedures:see note Microbiology:see note  Medications: Scheduled Meds:  buPROPion  150 mg Oral Daily   buPROPion  300 mg Oral QHS  enoxaparin (LOVENOX) injection  60 mg Subcutaneous Q24H   feeding supplement (OSMOLITE 1.5 CAL)  1,000 mL Per Tube Q24H   fluticasone furoate-vilanterol  1 puff Inhalation Daily   gabapentin  900 mg Oral BID   melatonin  10 mg Oral QHS   metoCLOPramide (REGLAN) injection  10 mg Intravenous Q8H   metoprolol succinate  50 mg Oral Daily   QUEtiapine  100 mg Oral QHS   sertraline  75 mg Oral Daily   umeclidinium bromide  1 puff Inhalation Daily   Continuous Infusions:   dextrose 5 % and 0.45% NaCl 125 mL/hr at 10/26/19 1113    Antimicrobials: Anti-infectives (From admission, onward)   None       Objective: Vitals: Today's Vitals   10/26/19 0903 10/26/19 0931 10/26/19 1143 10/26/19 1311  BP:      Pulse:      Resp:      Temp:      TempSrc:      SpO2:      Weight:      Height:      PainSc: 7  6  6  7     No intake or output data in the 24 hours ending 10/26/19 1536 Filed Weights   10/24/19 0344 10/25/19 0734 10/26/19 0500  Weight: 108 kg 112.6 kg 113.9 kg   Weight change:    Intake/Output from previous day: No intake/output data recorded. Intake/Output this shift: No intake/output data recorded.  Examination:  General exam: AAO X3, overweight, on room air, not in acute distress, HEENT:Oral mucosa moist, Ear/Nose WNL grossly, dentition normal. Respiratory system: bilaterally clear,no wheezing or crackles,no use of accessory muscle Cardiovascular system: S1 & S2 +, No JVD,. Gastrointestinal system: Abdomen soft, obese, with voluntary guarding,BS+ Nervous System:Alert, awake, moving extremities and grossly nonfocal Extremities: No edema, distal peripheral pulses palpable.  Skin: No rashes,no icterus. MSK: Normal muscle bulk,tone, power Data Reviewed: I have personally reviewed following labs and imaging studies CBC: Recent Labs  Lab 10/22/19 0440 10/25/19 0300 10/26/19 0322  WBC 6.8 7.2 7.7  HGB 10.0* 9.6* 9.7*  HCT 32.3* 31.5* 32.1*  MCV 92.6 92.4 92.5  PLT 286 252 239   Basic Metabolic Panel: Recent Labs  Lab 10/20/19 0324 10/20/19 0324 10/22/19 0440 10/23/19 0346 10/24/19 0333 10/25/19 0300 10/26/19 0322  NA 137   < > 135 137 137 136 134*  K 3.2*   < > 4.0 3.5 4.1 3.5 3.7  CL 101   < > 98 99 99 99 99  CO2 28   < > 27 27 27 28 26   GLUCOSE 90   < > 94 91 128* 119* 109*  BUN <5*   < > <5* <5* <5* <5* <5*  CREATININE 0.55   < > 0.62 0.56 0.58 0.54 0.61  CALCIUM 8.2*   < > 8.5* 8.7* 9.3 8.7* 8.4*  MG 1.9  --   --    --   --   --   --    < > = values in this interval not displayed.   GFR: Estimated Creatinine Clearance: 123.6 mL/min (by C-G formula based on SCr of 0.61 mg/dL). Liver Function Tests: Recent Labs  Lab 10/22/19 0440 10/23/19 0346 10/24/19 0333 10/25/19 0300 10/26/19 0322  AST 59* 32 25 15 15   ALT 21 16 14 12 11   ALKPHOS 475* 438* 399* 307* 263*  BILITOT 0.8 0.9 0.7 0.6 1.1  PROT 6.3* 6.6 7.1 6.3* 6.1*  ALBUMIN 2.6* 2.6* 2.9* 2.5* 2.5*  Recent Labs  Lab 10/22/19 0440 10/23/19 0346 10/24/19 0333 10/25/19 0300 10/26/19 0322  LIPASE 89* 72* 77* 67* 52*   No results for input(s): AMMONIA in the last 168 hours. Coagulation Profile: No results for input(s): INR, PROTIME in the last 168 hours. Cardiac Enzymes: No results for input(s): CKTOTAL, CKMB, CKMBINDEX, TROPONINI in the last 168 hours. BNP (last 3 results) No results for input(s): PROBNP in the last 8760 hours. HbA1C: No results for input(s): HGBA1C in the last 72 hours. CBG: Recent Labs  Lab 10/25/19 2006 10/26/19 0021 10/26/19 0342 10/26/19 0751 10/26/19 1128  GLUCAP 90 120* 103* 105* 114*   Lipid Profile: No results for input(s): CHOL, HDL, LDLCALC, TRIG, CHOLHDL, LDLDIRECT in the last 72 hours. Thyroid Function Tests: No results for input(s): TSH, T4TOTAL, FREET4, T3FREE, THYROIDAB in the last 72 hours. Anemia Panel: No results for input(s): VITAMINB12, FOLATE, FERRITIN, TIBC, IRON, RETICCTPCT in the last 72 hours. Sepsis Labs: No results for input(s): PROCALCITON, LATICACIDVEN in the last 168 hours.  Recent Results (from the past 240 hour(s))  SARS Coronavirus 2 by RT PCR (hospital order, performed in [facility] hospital lab) Nasopharyngeal Nasopharyngeal Swab     Status: None   Collection Time: 10/17/19  3:38 AM   Specimen: Nasopharyngeal Swab  Result Value Ref Range Status   SARS Coronavirus 2 NEGATIVE NEGATIVE Final    Comment: (NOTE) SARS-CoV-2 target nucleic acids are NOT DETECTED.  The  SARS-CoV-2 RNA is generally detectable in upper and lower respiratory specimens during the acute phase of infection. The lowest concentration of SARS-CoV-2 viral copies this assay can detect is 250 copies / mL. A negative result does not preclude SARS-CoV-2 infection and should not be used as the sole basis for treatment or other patient management decisions.  A negative result may occur with improper specimen collection / handling, submission of specimen other than nasopharyngeal swab, presence of viral mutation(s) within the areas targeted by this assay, and inadequate number of viral copies (<250 copies / mL). A negative result must be combined with clinical observations, patient history, and epidemiological information.  Fact Sheet for Patients:   BoilerBrush.com.cy  Fact Sheet for Healthcare Providers: https://pope.com/  This test is not yet approved or  cleared by the [place] FDA and has been authorized for detection and/or diagnosis of SARS-CoV-2 by FDA under an Emergency Use Authorization (EUA).  This EUA will remain in effect (meaning this test can be used) for the duration of the COVID-19 declaration under Section 564(b)(1) of the Act, 21 U.S.C. section 360bbb-3(b)(1), unless the authorization is terminated or revoked sooner.  Performed at [facility], [address], [place], [place] [address]     Radiology Studies: CT ABDOMEN PELVIS W CONTRAST  Result Date: 10/25/2019 CLINICAL DATA:  34 year old female with acute pancreatitis, nausea vomiting. EXAM: CT ABDOMEN AND PELVIS WITH CONTRAST TECHNIQUE: Multidetector CT imaging of the abdomen and pelvis was performed using the standard protocol following bolus administration of intravenous contrast. CONTRAST:  OMNIPAQUE IOHEXOL 300 MG/ML  SOLN COMPARISON:  CT abdomen pelvis dated 10/16/2019. FINDINGS: Lower chest: There is a small left pleural effusion.  There is partial consolidative changes of the left lung base which may represent atelectasis or infiltrate. Clinical correlation is recommended. No intra-abdominal free air. Upper abdominal edema and small ascites. Hepatobiliary: Fatty infiltration of the liver. No intrahepatic biliary ductal dilatation. Cholecystectomy. No retained calcified stone noted in the central CBD. Pancreas: Inflammatory changes of the pancreas in keeping with known  acute pancreatitis. Small focal ill-defined hypodense area in the uncinate process and distal body/tail of the pancreas appears similar to prior CT. These measure up to approximately 13 mm in the distal pancreas and likely represent focal area of edema or small pseudocysts. There is overall pancreatic atrophy for the patient's age. Diffuse upper abdominal edema has progressed since the prior CT. There is however interval decrease in the size of a loculated collection or pseudocyst in the left upper abdomen now measuring approximately 3.9 x 2.7 cm (previously 7.1 x 5.1 cm. The second fluid collection seen anterior to the stomach and the prior CT is not visualized on today's exam. An additional loculated collection along the left hemidiaphragm has decreased in size now measuring 2.7 x 1.6 cm (14/2). Spleen: Top-normal spleen size. Adrenals/Urinary Tract: The adrenal glands are grossly unremarkable. There is no hydronephrosis on either side. The visualized ureters and urinary bladder appear unremarkable. Stomach/Bowel: There is inflammatory changes and thickening of the distal transverse colon likely reactive to inflammatory changes of the pancreas. There is no bowel obstruction. The appendix is normal. Vascular/Lymphatic: The abdominal aorta and IVC are unremarkable. The SMV and main portal vein are patent. 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 No portal venous gas. There is no adenopathy. Reproductive: The uterus is grossly unremarkable. No  adnexal masses. Other: Loculated fluid collection in the left mid abdomen anterior to the psoas muscle similar to prior CT. Attention on follow-up imaging recommended. Musculoskeletal: No acute or significant osseous findings. IMPRESSION: 1. Acute pancreatitis with overall interval decrease in the size of the loculated collections or pseudocyst in the left upper abdomen. 2. 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 3. Small left pleural effusion with partial consolidative changes of the left lung base which may represent atelectasis or infiltrate. Clinical correlation is recommended. 4. Fatty liver. 5. No bowel obstruction. Normal appendix. 6. Segmental inflammatory changes and thickening of the distal transverse colon likely reactive to acute pancreatitis. Electronically Signed   By: Elgie CollardArash  Radparvar M.D.   On: 10/25/2019 19:22   DG Basil Dessaso G Tube Plc W/Fl W/Rad  Result Date: 10/25/2019 INDICATION: 34 year old female with pancreatitis. Request was made to reposition the patient's feeding tube in a post pyloric position. EXAM: RF-NASOGASTRIC TUBE PLACEMENT WITH FL AND WITH RAD CONTRAST:  No contrast was used during the examination. MEDICATIONS: Viscous lidocaine. ANESTHESIA/SEDATION: None. FLUOROSCOPY TIME:  Fluoroscopy Time: 1.4 sec Radiation Exposure Index (if provided by the fluoroscopic device): 45.10 mGy Number of Acquired Spot Images: 2 COMPLICATIONS: None immediate. PROCEDURE: The patient's existing feeding tube was removed due to poor condition of the outside coding. A new 10 [place] Dobhoff tube was lubricated with viscous lidocaine and inserted into the left nostril. Under intermittent fluoroscopic guidance, the Dobhoff tube was advanced through the stomach, through the duodenum with tip ultimately terminating over the third portion of the duodenum. A spot fluoroscopic image was saved for documentation purposes. The tube was affixed to the patient's nose with tape. The patient  tolerated the procedure well without immediate postprocedural complication. IMPRESSION: Satisfactory placement of a nasogastric tube with tip in the third portion of the duodenum. Electronically Signed   By: Emmaline KluverNancy  Ballantyne M.D.   On: 10/25/2019 12:42     LOS: 9 days   Lanae Boastamesh Jadis Pitter, MD [facility]  10/26/2019, 3:36 PM

## 2019-10-27 LAB — COMPREHENSIVE METABOLIC PANEL
ALT: 9 U/L (ref 0–44)
AST: 12 U/L — ABNORMAL LOW (ref 15–41)
Albumin: 2.4 g/dL — ABNORMAL LOW (ref 3.5–5.0)
Alkaline Phosphatase: 233 U/L — ABNORMAL HIGH (ref 38–126)
Anion gap: 6 (ref 5–15)
BUN: <5 mg/dL — ABNORMAL LOW (ref 6–20)
CO2: 25 mmol/L (ref 22–32)
Calcium: 8.2 mg/dL — ABNORMAL LOW (ref 8.9–10.3)
Chloride: 101 mmol/L (ref 98–111)
Creatinine, Ser: 0.59 mg/dL (ref 0.44–1.00)
GFR calc Af Amer: >60 mL/min (ref >60)
GFR calc non Af Amer: >60 mL/min (ref >60)
Glucose, Bld: 123 mg/dL — ABNORMAL HIGH (ref 70–99)
Potassium: 4.2 mmol/L (ref 3.5–5.1)
Sodium: 132 mmol/L — ABNORMAL LOW (ref 135–145)
Total Bilirubin: 0.8 mg/dL (ref 0.3–1.2)
Total Protein: 6 g/dL — ABNORMAL LOW (ref 6.5–8.1)

## 2019-10-27 LAB — GLUCOSE, CAPILLARY
Glucose-Capillary: 119 mg/dL — ABNORMAL HIGH (ref 70–99)
Glucose-Capillary: 106 mg/dL — ABNORMAL HIGH (ref 70–99)
Glucose-Capillary: 115 mg/dL — ABNORMAL HIGH (ref 70–99)
Glucose-Capillary: 109 mg/dL — ABNORMAL HIGH (ref 70–99)
Glucose-Capillary: 96 mg/dL (ref 70–99)

## 2019-10-27 LAB — CBC
HCT: 30.9 % — ABNORMAL LOW (ref 36.0–46.0)
Hemoglobin: 9.4 g/dL — ABNORMAL LOW (ref 12.0–15.0)
MCH: 27.8 pg (ref 26.0–34.0)
MCHC: 30.4 g/dL (ref 30.0–36.0)
MCV: 91.4 fL (ref 80.0–100.0)
Platelets: 250 10*3/uL (ref 150–400)
RBC: 3.38 MIL/uL — ABNORMAL LOW (ref 3.87–5.11)
RDW: 13.5 % (ref 11.5–15.5)
WBC: 8.2 10*3/uL (ref 4.0–10.5)
nRBC: 0 % (ref 0.0–0.2)

## 2019-10-27 LAB — LIPASE, BLOOD: Lipase: 43 U/L (ref 11–51)

## 2019-10-27 MED ORDER — HYDROMORPHONE HCL 2 MG PO TABS
1.0000 mg | ORAL_TABLET | ORAL | Status: DC | PRN
  Administered 2019-10-27 – 2019-10-30 (×7): 1 mg via ORAL
  Filled 2019-10-27 (×7): qty 1

## 2019-10-27 NOTE — Progress Notes (Addendum)
[facility] Progress Note  CC:  Recurrent pancreatitis   Subjective: She slept well last night. No N/V. Upper abdominal pain is well controlled, not any worse. She is tolerating clear liquids.  She wishes to avoid placement of another feeding tube. She passed a brown mud like stool x 1 this morning.    Objective:   CTAP 10/25/2019: 1. Acute pancreatitis with overall interval decrease in the size of the loculated collections or pseudocyst in the left upper abdomen. 2. 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 3. Small left pleural effusion with partial consolidative changes of the left lung base which may represent atelectasis or infiltrate. Clinical correlation is recommended. 4. Fatty liver. 5. No bowel obstruction. Normal appendix. 6. Segmental inflammatory changes and thickening of the distal transverse colon likely reactive to acute pancreatitis    Vital signs in last 24 hours: Temp:  [97.9 F (36.6 C)-99.2 F (37.3 C)] 98.4 F (36.9 C) (07/10 0408) Pulse Rate:  [100-109] 100 (07/10 0408) Resp:  [16-20] 16 (07/10 0408) BP: (105-119)/(75-90) 112/79 (07/10 0408) SpO2:  [93 %-96 %] 95 % (07/10 0908) Weight:  [114.8 kg] 114.8 kg (07/10 0429) Last BM Date: 10/26/19   General:   Alert 34 year old female in NAD Heart: Tachycardic,  No murmur.  Pulm:  Breath sounds clear throughout.  Abdomen: Obese abdomen, mild tenderness throughout the upper abdomen without rebound or guarding. + BS x 4 quads.  Extremities:  Without edema. Neurologic:  Alert and  oriented x4;  grossly normal neurologically. Psych:  Alert and cooperative. Normal mood and affect.  Intake/Output from previous day: No intake/output data recorded. Intake/Output this shift: Total I/O In: 236 [P.O.:236] Out: -   Lab Results: Recent Labs    10/25/19 0300 10/26/19 0322 10/27/19 0402  WBC 7.2 7.7 8.2  HGB 9.6* 9.7* 9.4*  HCT 31.5* 32.1* 30.9*  PLT 252 239 250     BMET Recent Labs    10/25/19 0300 10/26/19 0322 10/27/19 0402  NA 136 134* 132*  K 3.5 3.7 4.2  CL 99 99 101  CO2 _0 GLUCOSE 119* 109* 123*  BUN <5* <5* <5*  CREATININE 0.54 0.61 0.59  CALCIUM 8.7* 8.4* 8.2*   LFT Recent Labs    10/27/19 0402  PROT 6.0*  ALBUMIN 2.4*  AST 12*  ALT 9  ALKPHOS 233*  BILITOT 0.8   PT/INR No results for input(s): LABPROT, INR in the last 72 hours. Hepatitis Panel No results for input(s): HEPBSAG, HCVAB, HEPAIGM, HEPBIGM in the last 72 hours.  CT ABDOMEN PELVIS W CONTRAST  Result Date: 10/25/2019 CLINICAL DATA:  34 year old female with acute pancreatitis, nausea vomiting. EXAM: CT ABDOMEN AND PELVIS WITH CONTRAST TECHNIQUE: Multidetector CT imaging of the abdomen and pelvis was performed using the standard protocol following bolus administration of intravenous contrast. CONTRAST:  110m OMNIPAQUE IOHEXOL 300 MG/ML  SOLN COMPARISON:  CT abdomen pelvis dated 10/16/2019. FINDINGS: Lower chest: There is a small left pleural effusion. There is partial consolidative changes of the left lung base which may represent atelectasis or infiltrate. Clinical correlation is recommended. No intra-abdominal free air. Upper abdominal edema and small ascites. Hepatobiliary: Fatty infiltration of the liver. No intrahepatic biliary ductal dilatation. Cholecystectomy. No retained calcified stone noted in the central CBD. Pancreas: Inflammatory changes of the pancreas in keeping with known acute pancreatitis. Small focal ill-defined hypodense area in the uncinate process and distal body/tail of the pancreas appears similar to prior CT.  These measure up to approximately 13 mm in the distal pancreas and likely represent focal area of edema or small pseudocysts. There is overall pancreatic atrophy for the patient's age. Diffuse upper abdominal edema has progressed since the prior CT. There is however interval decrease in the size of a loculated collection or pseudocyst  in the left upper abdomen now measuring approximately 3.9 x 2.7 cm (previously 7.1 x 5.1 cm. The second fluid collection seen anterior to the stomach and the prior CT is not visualized on today's exam. An additional loculated collection along the left hemidiaphragm has decreased in size now measuring 2.7 x 1.6 cm (14/2). Spleen: Top-normal spleen size. Adrenals/Urinary Tract: The adrenal glands are grossly unremarkable. There is no hydronephrosis on either side. The visualized ureters and urinary bladder appear unremarkable. Stomach/Bowel: There is inflammatory changes and thickening of the distal transverse colon likely reactive to inflammatory changes of the pancreas. There is no bowel obstruction. The appendix is normal. Vascular/Lymphatic: The abdominal aorta and IVC are unremarkable. The SMV and main portal vein are patent. 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 No portal venous gas. There is no adenopathy. Reproductive: The uterus is grossly unremarkable. No adnexal masses. Other: Loculated fluid collection in the left mid abdomen anterior to the psoas muscle similar to prior CT. Attention on follow-up imaging recommended. Musculoskeletal: No acute or significant osseous findings. IMPRESSION: 1. Acute pancreatitis with overall interval decrease in the size of the loculated collections or pseudocyst in the left upper abdomen. 2. 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 3. Small left pleural effusion with partial consolidative changes of the left lung base which may represent atelectasis or infiltrate. Clinical correlation is recommended. 4. Fatty liver. 5. No bowel obstruction. Normal appendix. 6. Segmental inflammatory changes and thickening of the distal transverse colon likely reactive to acute pancreatitis. Electronically Signed   By: Anner Crete M.D.   On: 10/25/2019 19:22   DG Loyce Dys Tube Plc W/Fl W/Rad  Result Date: 10/25/2019 INDICATION:  34 year old female with pancreatitis. Request was made to reposition the patient's feeding tube in a post pyloric position. EXAM: RF-NASOGASTRIC TUBE PLACEMENT WITH FL AND WITH RAD CONTRAST:  No contrast was used during the examination. MEDICATIONS: Viscous lidocaine. ANESTHESIA/SEDATION: None. FLUOROSCOPY TIME:  Fluoroscopy Time: 1.4 sec Radiation Exposure Index (if provided by the fluoroscopic device): 45.10 mGy Number of Acquired Spot Images: 2 COMPLICATIONS: None immediate. PROCEDURE: The patient's existing feeding tube was removed due to poor condition of the outside coding. A new 10 [place] Dobhoff tube was lubricated with viscous lidocaine and inserted into the left nostril. Under intermittent fluoroscopic guidance, the Dobhoff tube was advanced through the stomach, through the duodenum with tip ultimately terminating over the third portion of the duodenum. A spot fluoroscopic image was saved for documentation purposes. The tube was affixed to the patient's nose with tape. The patient tolerated the procedure well without immediate postprocedural complication. IMPRESSION: Satisfactory placement of a nasogastric tube with tip in the third portion of the duodenum. Electronically Signed   By: Audie Pinto M.D.   On: 10/25/2019 12:42    Assessment / Plan:  58. 34 year old female with recurrent pancreatitis. Prior episodes of pancreatitis due to alcohol use but she has not had any alcohol in years. CTAP 7/8 showed acute pancreatitis with decrease size of pseudocysts. Chronic splenic vein thrombosis with collaterals. Fatty liver. Inflammatory changes and thickening to the distal transverse colon likely reactive secondary to acute  pancreatitis. S/P NGT placement post pylorus by IR on 7/9, a few hours later the tube fell out. Lipase 43. IgG4 normal.  -Continue clear liquid diet for now. Recommend Nutrition consult.  -Pain management per the hospitalist -Reglan PRN   2. Elevated alk phos and GGT levels.  Alk phos 233. AST 43. ALT 12. T. Bili 0.8. GGT 222 on 7/9.  Abdominal MRI/MRCP 10/17/2019 without biliary duct dilatation.  -Check  Hepatic panel and AMA in am -Consider EUS to assess the distal CBD. Further recommendations per Dr. Lyndel Safe   3. Normocytic anemia, stable. No signs of active GI bleeding   4. Hyponatremia  -Repeat BMP in am        Principal Problem:   Acute pancreatitis Active Problems:   Polyneuropathy   Benign essential HTN   Asthma, severe persistent, well-controlled   Pancreatic pseudocyst/cyst   Fatty liver   Malnutrition of moderate degree   Pancreatic pseudocyst   Elevated alkaline phosphatase level     LOS: 10 days   Noralyn Pick  10/27/2019, 10:37 AM    Attending physician's note   I have taken an interval history, reviewed the chart and examined the patient. I agree with the Advanced Practitioner's note, impression and recommendations.   Idiopathic recurrent pancreatitis with decreasing size of pseudocysts on CT 7/8. (No alcohol in recent years, s/p lap chole with neg MRCP for choledicholithiasis, trauma, FH, cocaine use, meds, elevated TG). Nl IGG4.  Has underlying fatty liver (which may explain elevated GGT/alk phos)  Tolerating clear liquids very well.  Abdominal pain is much better.  Pleased with progress.  CT from 10/25/2019 reviewed.  Plan: -Advance diet to full liquid diet in a.m. -Ambulate -Watch for exocrine and endocrine pancreatic insufficiency.  Once able to tolerate solids, and if she has diarrhea, will give trial of pancreatic enzymes. -Repeat CT pancreatic protocol in 8-12 weeks to assess size and maturity of pseudocysts (earlier if still with problems).  No need for any endoscopic intervention at the present time. -Will follow along.   Carmell Austria, MD Vel[facility] Heckler Fabienne Bruns [phone].

## 2019-10-27 NOTE — Progress Notes (Signed)
PROGRESS NOTE    Kara [name]  ZHY:[id number] DOB: 07/01/1985 DOA: 10/16/2019 PCP: Lawerance Cruel, MD   Chef Complaints: Abdominal pain  Brief Narrative: 34 year old with history of hypertension, asthma, previously alcoholic who quit for 3 years and history of alcohol induced pancreatitis, cholecystectomy after an episode of pancreatitis last year, last exacerbation in May 2020 and none since then presents with worsening abdominal pain.  Ongoing for about 2 months but worse for 2 days. In the emergency room, tachycardic, afebrile, WBC 13.5.  Renal functions normal.  Calcium normal. MRI of the abdomen shows multiple uncomplicated pancreatic cyst and acute inflammation on the head of the pancreas.  Lipase is 128. Patient was admitted for further management. Patient had NG tube placed and placed on tube feeding diet however did not tolerate well after few days-NGT were changed to IR feeding tube placed distal to ligament of Treitz-but the tube came out repeat CT abdomen was ordered that showed improving fluid collection and pancreatitis and patient was placed on clear liquid diet 7/9  Subjective: Reports she slept well pain is well controlled not any worse tolerating clear liquid diet. Passing stool Agreeable to try oral pain medication we will try Dilaudid instead of oxycodone  Assessment & Plan:  Acute pancreatitis, recurrent, with multiple pancreatic, peripancreatic fluid collection, pseudocyst largest 7.1 X5.1X 5.8 cm: Alcohol was felt to be the etiology and previous pancreatitis but patient reports abstinence for 3 years, had lap chole in March 2020.  MRCP done here, refer to the report.  GI consulted 7/2 and appreciate input.  IgG4 normal, LFTs lipase overall stable , albumin remains low. Tube feed was slowed 7/7, but 7/8 AM having vomiting and colitis.  Due to ongoing symptoms and difficulty to tolerate tube feeding repeat CT abdomen was ordered 7/8 which shows improving pancreatitis and  fluid collection.  IR guided NGT fell off 7/8 evening.  Patient is back on clear liquid diet 7/9 tolerating well continue gentle IV fluids appreciate GI input.  We will introduce oral Dilaudid to wean her off IV.   Elevated alk phos and GGT level alk phos 233 AST 43 ALT 12 total bili 0.8 GGT 222 on 7/9 abdominal MRI/MRCP June/30/21 without biliary duct dilatation checking hepatic panel and AMA in a.m.  Hypoalbuminemia in the setting of pancreatitis: Augment nutrition as above.  Consult Dietitian  Leukocytosis: Resolved  Polyneuropathy-on Neurontin Benign essential HTN: Stable on metoprolol.   History of asthma, severe persistent: Not wheezing, Hypomagnesemia/hypokalemia repleted and resolved. Hepatomegaly/hepatic steatosis no evidence of cirrhosis.  Will need outpatient follow-up. Morbid obesity with BMI 44: BMI at 41.  Will benefit with weight loss healthy lifestyle.  Cont home:sertraline,seroquel and bupropion.    DVT prophylaxis: SCDs Start: 10/17/19 0608 Code Status:Full Family Communication: plan of care discussed with patient at bedside.  Status is: Inpatient  Remains inpatient appropriate because:IV treatments appropriate due to intensity of illness or inability to take PO and Inpatient level of care appropriate due to severity of illness  Dispo: The patient is from: [place], independent.              Anticipated d/c is to: Home              Anticipated d/c date is:2 DAYS              Patient currently is not medically stable to d/c.  Will plan discharge once pain is an oral regimen and tolerating diet and okay GI  Nutrition: Diet Order  Diet clear liquid Room service appropriate? Yes; Fluid consistency: Thin  Diet effective now                 Body mass index is 42.12 kg/m.  Consultants: Gastroenterology Procedures:see note Microbiology:see note  Medications: Scheduled Meds: . buPROPion  150 mg Oral Daily  . buPROPion  300 mg Oral QHS  . enoxaparin  (LOVENOX) injection  60 mg Subcutaneous Q24H  . feeding supplement (OSMOLITE 1.5 CAL)  1,000 mL Per Tube Q24H  . fluticasone furoate-vilanterol  1 puff Inhalation Daily  . gabapentin  900 mg Oral BID  . melatonin  10 mg Oral QHS  . metoCLOPramide (REGLAN) injection  10 mg Intravenous Q8H  . metoprolol succinate  50 mg Oral Daily  . QUEtiapine  100 mg Oral QHS  . sertraline  75 mg Oral Daily  . umeclidinium bromide  1 puff Inhalation Daily   Continuous Infusions: . dextrose 5 % and 0.45% NaCl 125 mL/hr at 10/26/19 1909    Antimicrobials: Anti-infectives (From admission, onward)   None       Objective: Vitals: Today's Vitals   10/27/19 0908 10/27/19 0909 10/27/19 0938 10/27/19 1306  BP:      Pulse:      Resp:      Temp:      TempSrc:      SpO2: 95%     Weight:      Height:      PainSc:  '7  4  4     '$ Intake/Output Summary (Last 24 hours) at 10/27/2019 1334 Last data filed at 10/27/2019 0900 Gross per 24 hour  Intake 236 ml  Output --  Net 236 ml   Filed Weights   10/25/19 0734 10/26/19 0500 10/27/19 0429  Weight: 112.6 kg 113.9 kg 114.8 kg   Weight change: 2.2 kg   Intake/Output from previous day: No intake/output data recorded. Intake/Output this shift: Total I/O In: 236 [P.O.:236] Out: -   Examination:  General exam: AAOx3,NAD, weak appearing. HEENT:Oral mucosa moist, Ear/Nose WNL grossly, dentition normal. Respiratory system: bilaterally clear,no wheezing or crackles,no use of accessory muscle Cardiovascular system: S1 & S2 +, No JVD,. Gastrointestinal system: Abdomen soft, obese/mildly tender,ND, BS+ Nervous System:Alert, awake, moving extremities and grossly nonfocal Extremities: No edema, distal peripheral pulses palpable.  Skin: No rashes,no icterus. MSK: Normal muscle bulk,tone, power   Data Reviewed: I have personally reviewed following labs and imaging studies CBC: Recent Labs  Lab 10/22/19 0440 10/25/19 0300 10/26/19 0322 10/27/19 0402   WBC 6.8 7.2 7.7 8.2  HGB 10.0* 9.6* 9.7* 9.4*  HCT 32.3* 31.5* 32.1* 30.9*  MCV 92.6 92.4 92.5 91.4  PLT 286 252 239 174   Basic Metabolic Panel: Recent Labs  Lab 10/23/19 0346 10/24/19 0333 10/25/19 0300 10/26/19 0322 10/27/19 0402  NA 137 137 136 134* 132*  K 3.5 4.1 3.5 3.7 4.2  CL 99 99 99 99 101  CO2 '27 27 28 26 25  '$ GLUCOSE 91 128* 119* 109* 123*  BUN <5* <5* <5* <5* <5*  CREATININE 0.56 0.58 0.54 0.61 0.59  CALCIUM 8.7* 9.3 8.7* 8.4* 8.2*   GFR: Estimated Creatinine Clearance: 124.1 mL/min (by C-G formula based on SCr of 0.59 mg/dL). Liver Function Tests: Recent Labs  Lab 10/23/19 0346 10/24/19 0333 10/25/19 0300 10/26/19 0322 10/27/19 0402  AST 32 '25 15 15 '$ 12*  ALT '16 14 12 11 9  '$ ALKPHOS 438* 399* 307* 263* 233*  BILITOT 0.9 0.7 0.6 1.1 0.8  PROT 6.6 7.1 6.3* 6.1* 6.0*  ALBUMIN 2.6* 2.9* 2.5* 2.5* 2.4*   Recent Labs  Lab 10/23/19 0346 10/24/19 0333 10/25/19 0300 10/26/19 0322 10/27/19 0402  LIPASE 72* 77* 67* 52* 43   No results for input(s): AMMONIA in the last 168 hours. Coagulation Profile: No results for input(s): INR, PROTIME in the last 168 hours. Cardiac Enzymes: No results for input(s): CKTOTAL, CKMB, CKMBINDEX, TROPONINI in the last 168 hours. BNP (last 3 results) No results for input(s): PROBNP in the last 8760 hours. HbA1C: No results for input(s): HGBA1C in the last 72 hours. CBG: Recent Labs  Lab 10/26/19 2112 10/26/19 2352 10/27/19 0409 10/27/19 0815 10/27/19 1221  GLUCAP 122* 181* 119* 106* 115*   Lipid Profile: No results for input(s): CHOL, HDL, LDLCALC, TRIG, CHOLHDL, LDLDIRECT in the last 72 hours. Thyroid Function Tests: No results for input(s): TSH, T4TOTAL, FREET4, T3FREE, THYROIDAB in the last 72 hours. Anemia Panel: No results for input(s): VITAMINB12, FOLATE, FERRITIN, TIBC, IRON, RETICCTPCT in the last 72 hours. Sepsis Labs: No results for input(s): PROCALCITON, LATICACIDVEN in the last 168 hours.  No  results found for this or any previous visit (from the past 240 hour(s)).  Radiology Studies: CT ABDOMEN PELVIS W CONTRAST  Result Date: 10/25/2019 CLINICAL DATA:  34 year old female with acute pancreatitis, nausea vomiting. EXAM: CT ABDOMEN AND PELVIS WITH CONTRAST TECHNIQUE: Multidetector CT imaging of the abdomen and pelvis was performed using the standard protocol following bolus administration of intravenous contrast. CONTRAST:  124m OMNIPAQUE IOHEXOL 300 MG/ML  SOLN COMPARISON:  CT abdomen pelvis dated 10/16/2019. FINDINGS: Lower chest: There is a small left pleural effusion. There is partial consolidative changes of the left lung base which may represent atelectasis or infiltrate. Clinical correlation is recommended. No intra-abdominal free air. Upper abdominal edema and small ascites. Hepatobiliary: Fatty infiltration of the liver. No intrahepatic biliary ductal dilatation. Cholecystectomy. No retained calcified stone noted in the central CBD. Pancreas: Inflammatory changes of the pancreas in keeping with known acute pancreatitis. Small focal ill-defined hypodense area in the uncinate process and distal body/tail of the pancreas appears similar to prior CT. These measure up to approximately 13 mm in the distal pancreas and likely represent focal area of edema or small pseudocysts. There is overall pancreatic atrophy for the patient's age. Diffuse upper abdominal edema has progressed since the prior CT. There is however interval decrease in the size of a loculated collection or pseudocyst in the left upper abdomen now measuring approximately 3.9 x 2.7 cm (previously 7.1 x 5.1 cm. The second fluid collection seen anterior to the stomach and the prior CT is not visualized on today's exam. An additional loculated collection along the left hemidiaphragm has decreased in size now measuring 2.7 x 1.6 cm (14/2). Spleen: Top-normal spleen size. Adrenals/Urinary Tract: The adrenal glands are grossly unremarkable.  There is no hydronephrosis on either side. The visualized ureters and urinary bladder appear unremarkable. Stomach/Bowel: There is inflammatory changes and thickening of the distal transverse colon likely reactive to inflammatory changes of the pancreas. There is no bowel obstruction. The appendix is normal. Vascular/Lymphatic: The abdominal aorta and IVC are unremarkable. The SMV and main portal vein are patent. 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 No portal venous gas. There is no adenopathy. Reproductive: The uterus is grossly unremarkable. No adnexal masses. Other: Loculated fluid collection in the left mid abdomen anterior to the psoas muscle similar to prior CT. Attention on follow-up imaging recommended. Musculoskeletal: No acute  or significant osseous findings. IMPRESSION: 1. Acute pancreatitis with overall interval decrease in the size of the loculated collections or pseudocyst in the left upper abdomen. 2. 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 3. Small left pleural effusion with partial consolidative changes of the left lung base which may represent atelectasis or infiltrate. Clinical correlation is recommended. 4. Fatty liver. 5. No bowel obstruction. Normal appendix. 6. Segmental inflammatory changes and thickening of the distal transverse colon likely reactive to acute pancreatitis. Electronically Signed   By: Anner Crete M.D.   On: 10/25/2019 19:22     LOS: 10 days   Antonieta Pert, MD [facility]  10/27/2019, 1:34 PM

## 2019-10-28 DIAGNOSIS — R109 Unspecified abdominal pain: Secondary | ICD-10-CM

## 2019-10-28 LAB — HEPATIC FUNCTION PANEL
ALT: 9 U/L (ref 0–44)
AST: 13 U/L — ABNORMAL LOW (ref 15–41)
Albumin: 2.5 g/dL — ABNORMAL LOW (ref 3.5–5.0)
Alkaline Phosphatase: 216 U/L — ABNORMAL HIGH (ref 38–126)
Bilirubin, Direct: 0.2 mg/dL (ref 0.0–0.2)
Indirect Bilirubin: 0.4 mg/dL (ref 0.3–0.9)
Total Bilirubin: 0.6 mg/dL (ref 0.3–1.2)
Total Protein: 6.2 g/dL — ABNORMAL LOW (ref 6.5–8.1)

## 2019-10-28 LAB — LIPASE, BLOOD: Lipase: 84 U/L — ABNORMAL HIGH (ref 11–51)

## 2019-10-28 LAB — GLUCOSE, CAPILLARY
Glucose-Capillary: 102 mg/dL — ABNORMAL HIGH (ref 70–99)
Glucose-Capillary: 106 mg/dL — ABNORMAL HIGH (ref 70–99)
Glucose-Capillary: 107 mg/dL — ABNORMAL HIGH (ref 70–99)
Glucose-Capillary: 137 mg/dL — ABNORMAL HIGH (ref 70–99)
Glucose-Capillary: 93 mg/dL (ref 70–99)
Glucose-Capillary: 96 mg/dL (ref 70–99)

## 2019-10-28 NOTE — Progress Notes (Signed)
     Progress Note    ASSESSMENT AND PLAN:   Idiopathic recurrent pancreatitis with decreasing size of pseudocysts on CT 7/8. (No alcohol in recent years, s/p lap chole with neg MRCP for choledicholithiasis, No trauma, FH, cocaine use, meds, elevated TG). Nl IGG4.  Has underlying fatty liver (which may explain elevated GGT/alk phos)  Plan: -Advance diet to heart healthy diet in AM.  If tolerates, D/C home with GI follow-up. -Advised small but frequent meals. -If any diarrhea with solid foods, trial of pancreatic enzymes at D/C -FU in 6-8 weeks. -Repeat CT pancreatic protocol in 8-12 weeks to assess size and maturity of pseudocysts (earlier if still with problems).  No need for any endoscopic intervention at the present time. -Dr. Carlean Purl taking over inpatient service from tomorrow onwards.     SUBJECTIVE  Doing much better  Tolerating full liquids very well.  No further abdominal pain.  Had little diarrhea.  Has not tried anything solid yet.    OBJECTIVE:     Vital signs in last 24 hours: Temp:  [97.8 F (36.6 C)-98.1 F (36.7 C)] 98 F (36.7 C) (07/11 1340) Pulse Rate:  [88-93] 88 (07/11 1340) Resp:  [14-16] 14 (07/11 1340) BP: (94-107)/(62-76) 94/62 (07/11 1340) SpO2:  [89 %-95 %] 94 % (07/11 1340) Weight:  [115.1 kg] 115.1 kg (07/11 0431) Last BM Date: 10/26/19 General:   Alert, well-developed female in NAD EENT:  Normal hearing, non icteric sclera, conjunctive pink.  Heart:  Regular rate and rhythm; no murmur.  No lower extremity edema   Pulm: Normal respiratory effort, lungs CTA bilaterally without wheezes or crackles. Abdomen:  Soft, nondistended, nontender.  Normal bowel sounds,.       Neurologic:  Alert and  oriented x4;  grossly normal neurologically. Psych:  Pleasant, cooperative.  Normal mood and affect.   Intake/Output from previous day: 07/10 0701 - 07/11 0700 In: 712 [P.O.:712] Out: -  Intake/Output this shift: Total I/O In: 236  [P.O.:236] Out: -   Lab Results: Recent Labs    10/26/19 0322 10/27/19 0402  WBC 7.7 8.2  HGB 9.7* 9.4*  HCT 32.1* 30.9*  PLT 239 250   BMET Recent Labs    10/26/19 0322 10/27/19 0402  NA 134* 132*  K 3.7 4.2  CL 99 101  CO2 26 25  GLUCOSE 109* 123*  BUN <5* <5*  CREATININE 0.61 0.59  CALCIUM 8.4* 8.2*   LFT Recent Labs    10/28/19 0343  PROT 6.2*  ALBUMIN 2.5*  AST 13*  ALT 9  ALKPHOS 216*  BILITOT 0.6  BILIDIR 0.2  IBILI 0.4   PT/INR No results for input(s): LABPROT, INR in the last 72 hours. Hepatitis Panel No results for input(s): HEPBSAG, HCVAB, HEPAIGM, HEPBIGM in the last 72 hours.  No results found.   Principal Problem:   Acute pancreatitis Active Problems:   Polyneuropathy   Benign essential HTN   Asthma, severe persistent, well-controlled   Pancreatic pseudocyst/cyst   Fatty liver   Malnutrition of moderate degree   Pancreatic pseudocyst   Elevated alkaline phosphatase level     LOS: 11 days     Carmell Austria, MD 10/28/2019, 5:31 PM [facility] [phone]

## 2019-10-28 NOTE — Progress Notes (Signed)
PROGRESS NOTE    Kara [name]  PZW:[id number] DOB: 19-Aug-1985 DOA: 10/16/2019 PCP: [name], MD   Chef Complaints: Abdominal pain  Brief Narrative: 34 year old with history of hypertension, asthma, previously alcoholic who quit for 3 years and history of alcohol induced pancreatitis, cholecystectomy after an episode of pancreatitis last year, last exacerbation in May 2020 and none since then presents with worsening abdominal pain.  Ongoing for about 2 months but worse for 2 days. In the emergency room, tachycardic, afebrile, WBC 13.5.  Renal functions normal.  Calcium normal. MRI of the abdomen shows multiple uncomplicated pancreatic cyst and acute inflammation on the head of the pancreas.  Lipase is 128. Patient was admitted for further management. Patient had NG tube placed and placed on tube feeding diet however did not tolerate well after few days-NGT were changed to IR feeding tube placed distal to ligament of Treitz-but the tube came out repeat CT abdomen was ordered that showed improving fluid collection and pancreatitis and patient was placed on clear liquid diet 7/9  Subjective: Asking for full liquid diet denies any nausea vomiting or diarrhea.  Still has some abdominal discomfort but better Assessment & Plan:  Acute pancreatitis, recurrent, with multiple pancreatic, peripancreatic fluid collection, pseudocyst largest 7.1 X5.1X 5.8 cm: Alcohol was felt to be the etiology and previous pancreatitis but patient reports abstinence for 3 years, had lap chole in March 2020.  MRCP done here, refer to the report.  GI consulted 7/2 and appreciate input.  IgG4 normal, LFTs lipase overall stable , albumin remains low. Tube feed was slowed 7/7, but 7/8 AM having vomiting and colitis.  Due to ongoing symptoms and difficulty to tolerate tube feeding repeat CT abdomen was ordered 7/8 which shows improving pancreatitis and fluid collection.  IR guided NGT fell off 7/8 evening.  She is  tolerating clear liquid diet will advance to full liquid diet.   Lipase 84 from 43. Autoimmune panel positive ANA Might need full autoimmune work-up.  Hypoalbuminemia in the setting of pancreatitis: Augment nutrition as above.  Consult Dietitian  Leukocytosis: Resolved  Polyneuropathy-on Neurontin Benign essential HTN: Stable on metoprolol.   History of asthma, severe persistent: Not wheezing, Hypomagnesemia/hypokalemia repleted and resolved. Hepatomegaly/hepatic steatosis no evidence of cirrhosis.  Will need outpatient follow-up. Morbid obesity with BMI 44: BMI at 41.  Will benefit with weight loss healthy lifestyle.  Cont home:sertraline,seroquel and bupropion.    DVT prophylaxis: SCDs Start: 10/17/19 0608 Code Status:Full Family Communication: plan of care discussed with patient at bedside.  Status is: Inpatient  Remains inpatient appropriate because:IV treatments appropriate due to intensity of illness or inability to take PO and Inpatient level of care appropriate due to severity of illness  Dispo: The patient is [place]: [place], independent.              Anticipated d/c is to: Home              Anticipated d/c date is:2 DAYS              Patient currently is not medically stable to d/c.  Will plan discharge once pain is an oral regimen and tolerating diet and okay GI  Nutrition: Diet Order            Diet clear liquid Room service appropriate? Yes; Fluid consistency: Thin  Diet effective now                 Body mass index is 42.23 kg/m.  Consultants: Gastroenterology  Procedures:see note Microbiology:see note  Medications: Scheduled Meds: . buPROPion  150 mg Oral Daily  . buPROPion  300 mg Oral QHS  . enoxaparin (LOVENOX) injection  60 mg Subcutaneous Q24H  . feeding supplement (OSMOLITE 1.5 CAL)  1,000 mL Per Tube Q24H  . fluticasone furoate-vilanterol  1 puff Inhalation Daily  . gabapentin  900 mg Oral BID  . melatonin  10 mg Oral QHS  . metoCLOPramide  (REGLAN) injection  10 mg Intravenous Q8H  . metoprolol succinate  50 mg Oral Daily  . QUEtiapine  100 mg Oral QHS  . sertraline  75 mg Oral Daily  . umeclidinium bromide  1 puff Inhalation Daily   Continuous Infusions: . dextrose 5 % and 0.45% NaCl 125 mL/hr at 10/26/19 1909    Antimicrobials: Anti-infectives (From admission, onward)   None       Objective: Vitals: Today's Vitals   10/28/19 0814 10/28/19 1010 10/28/19 1042 10/28/19 1235  BP:      Pulse:      Resp:      Temp:      TempSrc:      SpO2: 95%     Weight:      Height:      PainSc:  8  5  7      Intake/Output Summary (Last 24 hours) at 10/28/2019 1247 Last data filed at 10/28/2019 0900 Gross per 24 hour  Intake 476 ml  Output --  Net 476 ml   Filed Weights   10/26/19 0500 10/27/19 0429 10/28/19 0431  Weight: 113.9 kg 114.8 kg 115.1 kg   Weight change: 0.3 kg   Intake/Output from previous day: 07/10 0701 - 07/11 0700 In: 712 [P.O.:712] Out: -  Intake/Output this shift: No intake/output data recorded.  Examination:  General exam: AAOx3,NAD, weak appearing. HEENT:Oral mucosa moist, Ear/Nose WNL grossly, dentition normal. Respiratory system: bilaterally clear,no wheezing or crackles,no use of accessory muscle Cardiovascular system: S1 & S2 +, No JVD,. Gastrointestinal system: Abdomen soft, obese/mildly tender,ND, BS+ Nervous System:Alert, awake, moving extremities and grossly nonfocal Extremities: No edema, distal peripheral pulses palpable.  Skin: No rashes,no icterus. MSK: Normal muscle bulk,tone, power   Data Reviewed: I have personally reviewed following labs and imaging studies CBC: Recent Labs  Lab 10/22/19 0440 10/25/19 0300 10/26/19 0322 10/27/19 0402  WBC 6.8 7.2 7.7 8.2  HGB 10.0* 9.6* 9.7* 9.4*  HCT 32.3* 31.5* 32.1* 30.9*  MCV 92.6 92.4 92.5 91.4  PLT 286 252 239 250   Basic Metabolic Panel: Recent Labs  Lab 10/23/19 0346 10/24/19 0333 10/25/19 0300 10/26/19 0322  10/27/19 0402  NA 137 137 136 134* 132*  K 3.5 4.1 3.5 3.7 4.2  CL 99 99 99 99 101  CO2 27 27 28 26 25   GLUCOSE 91 128* 119* 109* 123*  BUN <5* <5* <5* <5* <5*  CREATININE 0.56 0.58 0.54 0.61 0.59  CALCIUM 8.7* 9.3 8.7* 8.4* 8.2*   GFR: Estimated Creatinine Clearance: 124.3 mL/min (by C-G formula based on SCr of 0.59 mg/dL). Liver Function Tests: Recent Labs  Lab 10/24/19 0333 10/25/19 0300 10/26/19 0322 10/27/19 0402 10/28/19 0343  AST 25 15 15  12* 13*  ALT 14 12 11 9 9   ALKPHOS 399* 307* 263* 233* 216*  BILITOT 0.7 0.6 1.1 0.8 0.6  PROT 7.1 6.3* 6.1* 6.0* 6.2*  ALBUMIN 2.9* 2.5* 2.5* 2.4* 2.5*   Recent Labs  Lab 10/24/19 0333 10/25/19 0300 10/26/19 0322 10/27/19 0402 10/28/19 0343  LIPASE 77* 67* 52* 43 84*  No results for input(s): AMMONIA in the last 168 hours. Coagulation Profile: No results for input(s): INR, PROTIME in the last 168 hours. Cardiac Enzymes: No results for input(s): CKTOTAL, CKMB, CKMBINDEX, TROPONINI in the last 168 hours. BNP (last 3 results) No results for input(s): PROBNP in the last 8760 hours. HbA1C: No results for input(s): HGBA1C in the last 72 hours. CBG: Recent Labs  Lab 10/27/19 2022 10/28/19 0000 10/28/19 0410 10/28/19 0802 10/28/19 1159  GLUCAP 96 107* 102* 137* 106*   Lipid Profile: No results for input(s): CHOL, HDL, LDLCALC, TRIG, CHOLHDL, LDLDIRECT in the last 72 hours. Thyroid Function Tests: No results for input(s): TSH, T4TOTAL, FREET4, T3FREE, THYROIDAB in the last 72 hours. Anemia Panel: No results for input(s): VITAMINB12, FOLATE, FERRITIN, TIBC, IRON, RETICCTPCT in the last 72 hours. Sepsis Labs: No results for input(s): PROCALCITON, LATICACIDVEN in the last 168 hours.  No results found for this or any previous visit (from the past 240 hour(s)).  Radiology Studies: No results found.   LOS: 11 days   Alwyn Ren, MD [facility]  10/28/2019, 12:47 PM

## 2019-10-29 LAB — GLUCOSE, CAPILLARY
Glucose-Capillary: 102 mg/dL — ABNORMAL HIGH (ref 70–99)
Glucose-Capillary: 106 mg/dL — ABNORMAL HIGH (ref 70–99)
Glucose-Capillary: 107 mg/dL — ABNORMAL HIGH (ref 70–99)
Glucose-Capillary: 105 mg/dL — ABNORMAL HIGH (ref 70–99)
Glucose-Capillary: 89 mg/dL (ref 70–99)
Glucose-Capillary: 106 mg/dL — ABNORMAL HIGH (ref 70–99)
Glucose-Capillary: 104 mg/dL — ABNORMAL HIGH (ref 70–99)

## 2019-10-29 LAB — LIPASE, BLOOD: Lipase: 57 U/L — ABNORMAL HIGH (ref 11–51)

## 2019-10-29 LAB — NUCLEOTIDASE, 5', BLOOD: 5-Nucleotidase: 15 IU/L — ABNORMAL HIGH (ref 0–10)

## 2019-10-29 LAB — MITOCHONDRIAL ANTIBODIES: Mitochondrial M2 Ab, IgG: <20 Units (ref 0.0–20.0)

## 2019-10-29 MED ORDER — HYDROMORPHONE HCL 1 MG/ML IJ SOLN
1.0000 mg | INTRAMUSCULAR | Status: DC | PRN
  Administered 2019-10-29 – 2019-10-31 (×11): 1 mg via INTRAVENOUS
  Filled 2019-10-29 (×11): qty 1

## 2019-10-29 MED ORDER — ENSURE ENLIVE PO LIQD
237.0000 mL | ORAL | Status: DC
  Administered 2019-10-29 – 2019-10-30 (×2): 237 mL via ORAL

## 2019-10-29 MED ORDER — ENSURE MAX PROTEIN PO LIQD
11.0000 [oz_av] | Freq: Every day | ORAL | Status: DC
  Administered 2019-10-29 – 2019-10-30 (×2): 11 [oz_av] via ORAL
  Filled 2019-10-29 (×2): qty 330

## 2019-10-29 MED ORDER — PANCRELIPASE (LIP-PROT-AMYL) 12000-38000 UNITS PO CPEP
24000.0000 [IU] | ORAL_CAPSULE | Freq: Three times a day (TID) | ORAL | Status: DC
  Administered 2019-10-29 – 2019-10-31 (×7): 24000 [IU] via ORAL
  Filled 2019-10-29 (×7): qty 2

## 2019-10-29 NOTE — Progress Notes (Addendum)
   Progress Note  CC:     pancreatitis      Attending physician's note   I have taken an interval history, reviewed the chart and examined the patient. I agree with the Advanced Practitioner's note, impression and recommendations.   Improving, tolerating liquids well Unclear etiology for recurrent pancreatitis, will need repeat imaging in 2 to 3 months and will also need to exclude pancreas divisum  Elevated alk phos likely secondary to biliary stasis due to pancreatic head inflammation She is s/p cholecystectomy, MRCP negative for choledocholithiasis  K. 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 Renal function normal. Looks much better than when I was on service last week.  --Tolerating full liquids started yesterday. Still requiring frequent Dilaudid but able to eat without significant discomfort. Continue Reglan.  --Having loose stools, ? Role for pancreatic enzymes in setting of subacute pancreatitis. Will try Creon, 2 with meals and one with snacks --Elevated alk phos, improving ( ? Secondary to pancreatic head inflammation, ? Stricture)   --Previous episodes felt to be secondary to Etoh. Etiologies of more recent episodes is unclear since abstinent from Etoh. She is s/p cholecystectomy. MRCP negative for biliary etiology.  Normal triglycerides and Ca+. Autoimmune labs negative. She was taking lasix at home and that has been linked to pancreatitis.  --Hasn't been able to tolerate beyond clears in a couple of months. Dobbhoff placed  but on xray showed termination in stomach. Enteral feedings started  anyway, she didn't tolerate due to recurrent fullness / nausea. On 10/26/19 IR placed small bowel feeding tube but it fell out and hasn't been replaced --Repeat CTscan w/ contrast on 10/25/19 done for failure to improved showed interval decrease in size of fluid collections in left upper abdomen. Segmental inflammatory changes and thickening of distal transverse colon likely reactive to pancreatitis.  -Will need repeat imaging in 8-12 weeks to assess size / maturity of fluid collections.        #Hepatomegaly/ hepaticsteatosis --No evidence for cirrhosis on imaging.  --mild ascites present on imaging but may be related to pancreatitis. --platelets are normal. Minor elevation in INR to 1.4 ( possibly nutritional) --Albumin is low but could be reactive and also explained by poor nutrition  --Long term she will need to lose weight to help with management of steatosis.     SUBJECTIVE    Feels much better than when I saw her last week. Eating slowly but able to tolerate fulls.    OBJECTIVE:     Vital signs in last 24 hours: Temp:  [98 F (36.7 C)-99.2 F (37.3 C)] 99.2 F (37.3 C) (07/12 0405) Pulse Rate:  [67-92] 67 (07/12 0405) Resp:  [14-18] 18 (07/12 0405) BP: (94-124)/(62-83) 108/76 (07/12 0405) SpO2:  [93 %-97 %] 94 % (07/12 0807) Last BM Date: 10/26/19 General:   Alert, in NAD. Sitting in bedside chair eating breakfast Heart:  Regular rate and rhythm.  No lower extremity edema   Pulm: Normal respiratory effort   Abdomen:  Soft,   Nondistended, mild diffuse tenderness.  Normal bowel sounds.         Neurologic:  Alert and  oriented,  grossly normal neurologically. Psych:  Pleasant, cooperative.    Normal mood and affect.   Intake/Output from previous day: 07/11 0701 - 07/12 0700 In: 236 [P.O.:236] Out: -  Intake/Output this shift: No intake/output data recorded.  Lab Results: Recent Labs    10/27/19 0402  WBC 8.2  HGB 9.4*  HCT 30.9*  PLT 250   BMET Recent Labs      10/27/19 0402  NA 132*  K 4.2  CL 101  CO2 25  GLUCOSE 123*  BUN <5*  CREATININE 0.59  CALCIUM 8.2*   LFT Recent Labs    10/28/19 0343  PROT 6.2*  ALBUMIN 2.5*  AST 13*  ALT 9  ALKPHOS 216*  BILITOT 0.6  BILIDIR 0.2  IBILI 0.4   P  Principal Problem:   Acute pancreatitis Active Problems:   Abdominal pain   Polyneuropathy   Benign essential HTN   Asthma, severe persistent, well-controlled   Pancreatic pseudocyst/cyst   Fatty liver   Malnutrition of moderate degree   Pancreatic pseudocyst   Elevated alkaline phosphatase level     LOS: 12 days   [name] ,NP 10/29/2019, 8:48 AM

## 2019-10-29 NOTE — Progress Notes (Signed)
PROGRESS NOTE    Kara [name]  [id number]:[id number] DOB: 07/27/1985 DOA: 10/16/2019 PCP: Lawerance Cruel, MD   Chef Complaints: Abdominal pain  Brief Narrative: 34 year old with history of hypertension, asthma, previously alcoholic who quit for 3 years and history of alcohol induced pancreatitis, cholecystectomy after an episode of pancreatitis last year, last exacerbation in May 2020 and none since then presents with worsening abdominal pain.  Ongoing for about 2 months but worse for 2 days. In the emergency room, tachycardic, afebrile, WBC 13.5.  Renal functions normal.  Calcium normal. MRI of the abdomen shows multiple uncomplicated pancreatic cyst and acute inflammation on the head of the pancreas.  Lipase is 128. Patient was admitted for further management. Patient had NG tube placed and placed on tube feeding diet however did not tolerate well after few days-NGT were changed to IR feeding tube placed distal to ligament of Treitz-but the tube came out repeat CT abdomen was ordered that showed improving fluid collection and pancreatitis and patient was placed on clear liquid diet 7/9. Patient was continued on liquid diet being advanced slowly overall improving.  Subjective: Tolerating full liquid diet would like to try heart healthy diet this afternoon Pain is controlled weaning to p.o. meds Nausea vomiting. Assessment & Plan:  Acute pancreatitis, recurrent, with multiple pancreatic, peripancreatic fluid collection, pseudocyst largest 7.1 X5.1X 5.8 cm: Alcohol was felt to be the etiology and previous pancreatitis but patient reports abstinence for 3 years, had lap chole in March 2020.  MRCP was done- refer to report.  He is following closely, work-up with IgG4 normal LFTs stable.  Attempted NGT feeding but did not tolerate after few days then IR placed tube feed past ligament of Treitz but came off late in the evening.  Repeat CT abdomen was obtained that showed improving inflammation and fluid  collection,was placed on clear liquid diet wean down on IV Dilaudid continue p.o. Dilaudid.  Overall improving advance to heart healthy diet today.  Continue scheduled Reglan, GI is initiating Creon . Repeat imaging in 8 to 12 weeks to assess fluid collections.  Elevated alk phos and GGT MRCP without biliary duct dilation, likely secondary to biliary status due to pancreatic inflammation.  Anemia likely from chronic disease hemoglobin 9 to 10 g range.  Monitor.  Hypoalbuminemia due to decreased oral intake, #1  Leukocytosis: Resolved  Polyneuropathy-on Neurontin Benign essential HTN: Blood pressure is controlled on metoprolol. History of asthma, severe persistent: Not wheezing and respiratory status is stable. Hypomagnesemia/hypokalemia: Electrolytes stable.  Hepatomegaly/hepatic steatosis no evidence of cirrhosis.  Will need outpatient follow-up. Morbid obesity with BMI 44: BMI at 41.  Will benefit with weight loss healthy lifestyle.  Cont home:sertraline,seroquel and bupropion.    DVT prophylaxis: SCDs Start: 10/17/19 0608 Code Status:Full Family Communication: plan of care discussed with patient at bedside.  Status is: Inpatient  Remains inpatient appropriate because:IV treatments appropriate due to intensity of illness or inability to take PO and Inpatient level of care appropriate due to severity of illness  Dispo: The patient is from: [place], independent.              Anticipated d/c is to: Home              Anticipated d/c date is: day              Patient currently is not medically stable to d/c.  Advancing diet to heart healthy diet and plan for discharge once okay with GI and tolerating diet.  Nutrition: Diet Order            Diet Heart Room service appropriate? Yes; Fluid consistency: Thin  Diet effective now                 Body mass index is 42.23 kg/m.  Consultants: Gastroenterology Procedures:see note Microbiology:see note  Medications: Scheduled  Meds: . buPROPion  150 mg Oral Daily  . buPROPion  300 mg Oral QHS  . enoxaparin (LOVENOX) injection  60 mg Subcutaneous Q24H  . feeding supplement (ENSURE ENLIVE)  237 mL Oral Q24H  . fluticasone furoate-vilanterol  1 puff Inhalation Daily  . gabapentin  900 mg Oral BID  . lipase/protease/amylase  24,000 Units Oral TID AC  . melatonin  10 mg Oral QHS  . metoCLOPramide (REGLAN) injection  10 mg Intravenous Q8H  . metoprolol succinate  50 mg Oral Daily  . Ensure Max Protein  11 oz Oral Daily  . QUEtiapine  100 mg Oral QHS  . sertraline  75 mg Oral Daily  . umeclidinium bromide  1 puff Inhalation Daily   Continuous Infusions: . dextrose 5 % and 0.45% NaCl 125 mL/hr at 10/29/19 1003    Antimicrobials: Anti-infectives (From admission, onward)   None       Objective: Vitals: Today's Vitals   10/29/19 0807 10/29/19 1007 10/29/19 1104 10/29/19 1157  BP:      Pulse:      Resp:      Temp:      TempSrc:      SpO2: 94%     Weight:      Height:      PainSc:  '7  5  6     '$ Intake/Output Summary (Last 24 hours) at 10/29/2019 1511 Last data filed at 10/29/2019 1300 Gross per 24 hour  Intake 480 ml  Output --  Net 480 ml   Filed Weights   10/26/19 0500 10/27/19 0429 10/28/19 0431  Weight: 113.9 kg 114.8 kg 115.1 kg   Weight change:    Intake/Output from previous day: 07/11 0701 - 07/12 0700 In: 236 [P.O.:236] Out: -  Intake/Output this shift: Total I/O In: 480 [P.O.:480] Out: -   Examination:  General exam: AAOx3, NAD, weak appearing. HEENT:Oral mucosa moist, Ear/Nose WNL grossly, dentition normal. Respiratory system: bilaterally clear,no wheezing or crackles,no use of accessory muscle Cardiovascular system: S1 & S2 +, No JVD,. Gastrointestinal system: Abdomen soft, NT,ND, BS+ Nervous System:Alert, awake, moving extremities and grossly nonfocal Extremities: No edema, distal peripheral pulses palpable.  Skin: No rashes,no icterus. MSK: Normal muscle bulk,tone,  power   Data Reviewed: I have personally reviewed following labs and imaging studies CBC: Recent Labs  Lab 10/25/19 0300 10/26/19 0322 10/27/19 0402  WBC 7.2 7.7 8.2  HGB 9.6* 9.7* 9.4*  HCT 31.5* 32.1* 30.9*  MCV 92.4 92.5 91.4  PLT 252 239 174   Basic Metabolic Panel: Recent Labs  Lab 10/23/19 0346 10/24/19 0333 10/25/19 0300 10/26/19 0322 10/27/19 0402  NA 137 137 136 134* 132*  K 3.5 4.1 3.5 3.7 4.2  CL 99 99 99 99 101  CO2 '27 27 28 26 25  '$ GLUCOSE 91 128* 119* 109* 123*  BUN <5* <5* <5* <5* <5*  CREATININE 0.56 0.58 0.54 0.61 0.59  CALCIUM 8.7* 9.3 8.7* 8.4* 8.2*   GFR: Estimated Creatinine Clearance: 124.3 mL/min (by C-G formula based on SCr of 0.59 mg/dL). Liver Function Tests: Recent Labs  Lab 10/24/19 9449 10/25/19 0300 10/26/19 0322 10/27/19 0402 10/28/19 0343  AST '25 15 15 '$ 12* 13*  ALT '14 12 11 9 9  '$ ALKPHOS 399* 307* 263* 233* 216*  BILITOT 0.7 0.6 1.1 0.8 0.6  PROT 7.1 6.3* 6.1* 6.0* 6.2*  ALBUMIN 2.9* 2.5* 2.5* 2.4* 2.5*   Recent Labs  Lab 10/25/19 0300 10/26/19 0322 10/27/19 0402 10/28/19 0343 10/29/19 0437  LIPASE 67* 52* 43 84* 57*   No results for input(s): AMMONIA in the last 168 hours. Coagulation Profile: No results for input(s): INR, PROTIME in the last 168 hours. Cardiac Enzymes: No results for input(s): CKTOTAL, CKMB, CKMBINDEX, TROPONINI in the last 168 hours. BNP (last 3 results) No results for input(s): PROBNP in the last 8760 hours. HbA1C: No results for input(s): HGBA1C in the last 72 hours. CBG: Recent Labs  Lab 10/28/19 2001 10/29/19 0007 10/29/19 0403 10/29/19 0739 10/29/19 1147  GLUCAP 96 106* 102* 107* 105*   Lipid Profile: No results for input(s): CHOL, HDL, LDLCALC, TRIG, CHOLHDL, LDLDIRECT in the last 72 hours. Thyroid Function Tests: No results for input(s): TSH, T4TOTAL, FREET4, T3FREE, THYROIDAB in the last 72 hours. Anemia Panel: No results for input(s): VITAMINB12, FOLATE, FERRITIN, TIBC, IRON,  RETICCTPCT in the last 72 hours. Sepsis Labs: No results for input(s): PROCALCITON, LATICACIDVEN in the last 168 hours.  No results found for this or any previous visit (from the past 240 hour(s)).  Radiology Studies: No results found.   LOS: 12 days   Antonieta Pert, MD [facility]  10/29/2019, 3:11 PM

## 2019-10-29 NOTE — Progress Notes (Signed)
Nutrition Follow-up  DOCUMENTATION CODES:   Obesity unspecified, Non-severe (moderate) malnutrition in context of acute illness/injury  INTERVENTION:   -Ensure Enlive po daily, each supplement provides 350 kcal and 20 grams of protein -Ensure MAX Protein po daily, each supplement provides 150 kcal and 30 grams of protein  NUTRITION DIAGNOSIS:   Moderate Malnutrition related to acute illness (acute pancreatitis) as evidenced by energy intake < or equal to 75% for > or equal to 1 month, mild fat depletion, mild muscle depletion.  GOAL:   Patient will meet greater than or equal to 90% of their needs  MONITOR:   PO intake, Labs, Weight trends, I & O's, TF tolerance, Diet advancement  REASON FOR ASSESSMENT:   Consult Assessment of nutrition requirement/status  ASSESSMENT:   34 year old with history of hypertension, asthma, previously alcoholic who quit for 3 years and history of alcohol induced pancreatitis, cholecystectomy after an episode of pancreatitis last year, last exacerbation in May 2020 and none since then presents with worsening abdominal pain.  Ongoing for about 2 months but worse for 2 days.  6/29: admitted 7/5: s/p NGT placed 7/9: tube advanced post pylorus, tube fell out        Diet advanced to clears 7/11: diet advanced to fulls 7/12: diet advanced to heart healthy  Patient reports tolerating her 2 trays of full liquids, she had some yogurt, pudding and juices. Pt is now on a heart healthy diet but has not ordered lunch yet. Pt is interested in trying some protein supplements given her extended poor PO. Will order Ensure Max and Ensure Enlive to try. Encouraged small, frequent meals throughout the day, discouraged high fat or fried foods.   Admission weight: 253 lbs. Current weight: 253 lbs.  Medications: Creon, IV Reglan, D5 infusion  Labs reviewed: CBGs: 105-107  Diet Order:   Diet Order            Diet Heart Room service appropriate? Yes; Fluid  consistency: Thin  Diet effective now                 EDUCATION NEEDS:   Education needs have been addressed  Skin:  Skin Assessment: Reviewed RN Assessment  Last BM:  7/12  Height:   Ht Readings from Last 1 Encounters:  10/21/19 5\' 5"  (1.651 m)    Weight:   Wt Readings from Last 1 Encounters:  10/28/19 115.1 kg   BMI:  Body mass index is 42.23 kg/m.  Estimated Nutritional Needs:   Kcal:  2000-2200  Protein:  75-90g  Fluid:  2L/day  12/29/19, MS, RD, LDN Inpatient Clinical Dietitian Contact information available via Amion

## 2019-10-30 ENCOUNTER — Ambulatory Visit: Payer: BC Managed Care – PPO | Admitting: Allergy and Immunology

## 2019-10-30 LAB — COMPREHENSIVE METABOLIC PANEL
ALT: 9 U/L (ref 0–44)
AST: 14 U/L — ABNORMAL LOW (ref 15–41)
Albumin: 2.4 g/dL — ABNORMAL LOW (ref 3.5–5.0)
Alkaline Phosphatase: 214 U/L — ABNORMAL HIGH (ref 38–126)
Anion gap: 11 (ref 5–15)
BUN: <5 mg/dL — ABNORMAL LOW (ref 6–20)
CO2: 27 mmol/L (ref 22–32)
Calcium: 8.5 mg/dL — ABNORMAL LOW (ref 8.9–10.3)
Chloride: 99 mmol/L (ref 98–111)
Creatinine, Ser: 0.64 mg/dL (ref 0.44–1.00)
GFR calc Af Amer: >60 mL/min (ref >60)
GFR calc non Af Amer: >60 mL/min (ref >60)
Glucose, Bld: 109 mg/dL — ABNORMAL HIGH (ref 70–99)
Potassium: 4 mmol/L (ref 3.5–5.1)
Sodium: 137 mmol/L (ref 135–145)
Total Bilirubin: 0.8 mg/dL (ref 0.3–1.2)
Total Protein: 6.3 g/dL — ABNORMAL LOW (ref 6.5–8.1)

## 2019-10-30 LAB — GLUCOSE, CAPILLARY
Glucose-Capillary: 117 mg/dL — ABNORMAL HIGH (ref 70–99)
Glucose-Capillary: 110 mg/dL — ABNORMAL HIGH (ref 70–99)
Glucose-Capillary: 109 mg/dL — ABNORMAL HIGH (ref 70–99)
Glucose-Capillary: 96 mg/dL (ref 70–99)
Glucose-Capillary: 98 mg/dL (ref 70–99)
Glucose-Capillary: 98 mg/dL (ref 70–99)

## 2019-10-30 LAB — CBC
HCT: 32.5 % — ABNORMAL LOW (ref 36.0–46.0)
Hemoglobin: 10.1 g/dL — ABNORMAL LOW (ref 12.0–15.0)
MCH: 28.4 pg (ref 26.0–34.0)
MCHC: 31.1 g/dL (ref 30.0–36.0)
MCV: 91.3 fL (ref 80.0–100.0)
Platelets: 296 10*3/uL (ref 150–400)
RBC: 3.56 MIL/uL — ABNORMAL LOW (ref 3.87–5.11)
RDW: 13.3 % (ref 11.5–15.5)
WBC: 6.7 10*3/uL (ref 4.0–10.5)
nRBC: 0 % (ref 0.0–0.2)

## 2019-10-30 LAB — LIPASE, BLOOD: Lipase: 60 U/L — ABNORMAL HIGH (ref 11–51)

## 2019-10-30 MED ORDER — OXYCODONE-ACETAMINOPHEN 5-325 MG PO TABS
1.0000 | ORAL_TABLET | ORAL | Status: DC | PRN
  Administered 2019-10-30 – 2019-10-31 (×5): 1 via ORAL
  Filled 2019-10-30 (×5): qty 1

## 2019-10-30 MED ORDER — FENTANYL CITRATE (PF) 100 MCG/2ML IJ SOLN
25.0000 ug | Freq: Once | INTRAMUSCULAR | Status: AC
  Administered 2019-10-30: 25 ug via INTRAVENOUS
  Filled 2019-10-30: qty 2

## 2019-10-30 NOTE — Progress Notes (Signed)
PROGRESS NOTE    Kara [name]  LFY:[id number] DOB: 1985-04-23 DOA: 10/16/2019 PCP: Lawerance Cruel, MD   Chef Complaints: Abdominal pain  Brief Narrative: 34 year old with history of hypertension, asthma, previously alcoholic who quit for 3 years and history of alcohol induced pancreatitis, cholecystectomy after an episode of pancreatitis last year, last exacerbation in May 2020 and none since then presents with worsening abdominal pain.  Ongoing for about 2 months but worse for 2 days. In the emergency room, tachycardic, afebrile, WBC 13.5.  Renal functions normal.  Calcium normal. MRI of the abdomen shows multiple uncomplicated pancreatic cyst and acute inflammation on the head of the pancreas.  Lipase is 128. Patient was admitted for further management. Patient had NG tube placed and placed on tube feeding diet however did not tolerate well after few days-NGT were changed to IR feeding tube placed distal to ligament of Treitz-but the tube came out repeat CT abdomen was ordered that showed improving fluid collection and pancreatitis and patient was placed on clear liquid diet 7/9. Patient was continued on liquid diet being advanced slowly overall improving.  Subjective:  Did not sleep well last night due to pain. Has been tolerating heart healthy diet. No Nausea or vomiting.  Assessment & Plan:  Acute pancreatitis, recurrent, with multiple pancreatic, peripancreatic fluid collection, pseudocyst largest 7.1 X5.1X 5.8 cm: Alcohol was felt to be the etiology and previous pancreatitis but patient reports abstinence for 3 years, had lap chole in March 2020.  MRCP negative for biliary etiology.  Seen by GI, had  further work-up with IgG4 normal LFTs stable.  Attempted NGT feeding but did not tolerate after few days then IR placed tube feed past ligament of Treitz but came off late in the evening.  Repeat CT abdomen was obtained that showed improving inflammation and fluid collection.  Patient  diet was slowly advanced at this time tolerating heart healthy diet, complaints of pain-on IV Dilaudid transitioning to oral Percocet, had extensive discussion about pain management and need for nonopiate regimen.  Explained about dependence tolerance nature of narcotic meds and other side effects. Cont reglan, Creon. She will need repeat imaging in 8 to 12 weeks to assess fluid collections.  GI following closely hopefully we can discharge her in a day or 2 once pain is controlled and okay with GI  Elevated alk phos and GGT MRCP without biliary duct dilation,likely secondary to biliary status due to pancreatic inflammation.  Anemia likely from chronic disease hemoglobin is-stable follow-up with outpatient.  Hypoalbuminemia due to decreased oral intake, #1 augment nutrition.  Leukocytosis: Resolved  Polyneuropathy-on Neurontin Benign essential HTN: BP is controlled on metoprolol.   History of asthma, severe persistent: No shortness of breath or wheezing.   Hypomagnesemia/hypokalemia: Labs stable Hepatomegaly/hepatic steatosis no evidence of cirrhosis.  Follow-up with GI as outpatient. Morbid obesity with BMI 44: BMI at 41.  She will benefit with outpatient follow-up with PCP weight loss.     Cont home:sertraline,seroquel and bupropion.    DVT prophylaxis: SCDs Start: 10/17/19 0608 Code Status:Full Family Communication: plan of care discussed with patient at bedside.  Status is: Inpatient  Remains inpatient appropriate because:IV treatments appropriate due to intensity of illness or inability to take PO and Inpatient level of care appropriate due to severity of illness remains hospitalized for ongoing pain related to pancreatitis.  Dispo: The patient is from: [place], independent.              Anticipated d/c is to: Home  Anticipated d/c date is: 1-2 days              Patient currently is not medically stable to d/c.  Planning for discharge home once pain is controlled no  issues with nausea vomiting and okay with GI hopefully soon. Reevaluate in the morning and discuss with GI  Nutrition: Diet Order            Diet Heart Room service appropriate? Yes; Fluid consistency: Thin  Diet effective now                 Body mass index is 39.99 kg/m.  Consultants: Gastroenterology Procedures:see note Microbiology:see note  Medications: Scheduled Meds: . buPROPion  150 mg Oral Daily  . buPROPion  300 mg Oral QHS  . enoxaparin (LOVENOX) injection  60 mg Subcutaneous Q24H  . feeding supplement (ENSURE ENLIVE)  237 mL Oral Q24H  . fluticasone furoate-vilanterol  1 puff Inhalation Daily  . gabapentin  900 mg Oral BID  . lipase/protease/amylase  24,000 Units Oral TID AC  . melatonin  10 mg Oral QHS  . metoCLOPramide (REGLAN) injection  10 mg Intravenous Q8H  . metoprolol succinate  50 mg Oral Daily  . Ensure Max Protein  11 oz Oral Daily  . QUEtiapine  100 mg Oral QHS  . sertraline  75 mg Oral Daily  . umeclidinium bromide  1 puff Inhalation Daily   Continuous Infusions: . dextrose 5 % and 0.45% NaCl 125 mL/hr at 10/30/19 1207    Antimicrobials: Anti-infectives (From admission, onward)   None       Objective: Vitals: Today's Vitals   10/30/19 0801 10/30/19 0958 10/30/19 1029 10/30/19 1210  BP:      Pulse:      Resp:      Temp:      TempSrc:      SpO2:      Weight:      Height:      PainSc: 7  7  Asleep 7     Intake/Output Summary (Last 24 hours) at 10/30/2019 1405 Last data filed at 10/30/2019 1206 Gross per 24 hour  Intake 472 ml  Output --  Net 472 ml   Filed Weights   10/27/19 0429 10/28/19 0431 10/30/19 0512  Weight: 114.8 kg 115.1 kg 109 kg   Weight change:    Intake/Output from previous day: 07/12 0701 - 07/13 0700 In: 480 [P.O.:480] Out: -  Intake/Output this shift: Total I/O In: 472 [P.O.:472] Out: -   Examination:  General exam: AAOx3 ,NAD, weak appearing. HEENT:Oral mucosa moist, Ear/Nose WNL grossly,  dentition normal. Respiratory system: bilaterally clear,no wheezing or crackles,no use of accessory muscle Cardiovascular system: S1 & S2 +, No JVD,. Gastrointestinal system: Abdomen soft,NT,ND, BS+ Nervous System:Alert, awake, moving extremities and grossly nonfocal Extremities: No edema, distal peripheral pulses palpable.  Skin: No rashes,no icterus. MSK: Normal muscle bulk,tone, power  Data Reviewed: I have personally reviewed following labs and imaging studies CBC: Recent Labs  Lab 10/25/19 0300 10/26/19 0322 10/27/19 0402 10/30/19 0416  WBC 7.2 7.7 8.2 6.7  HGB 9.6* 9.7* 9.4* 10.1*  HCT 31.5* 32.1* 30.9* 32.5*  MCV 92.4 92.5 91.4 91.3  PLT 252 239 250 010   Basic Metabolic Panel: Recent Labs  Lab 10/24/19 0333 10/25/19 0300 10/26/19 0322 10/27/19 0402 10/30/19 0416  NA 137 136 134* 132* 137  K 4.1 3.5 3.7 4.2 4.0  CL 99 99 99 101 99  CO2 _0 27  GLUCOSE 128* 119* 109* 123* 109*  BUN <5* <5* <5* <5* <5*  CREATININE 0.58 0.54 0.61 0.59 0.64  CALCIUM 9.3 8.7* 8.4* 8.2* 8.5*   GFR: Estimated Creatinine Clearance: 120.5 mL/min (by C-G formula based on SCr of 0.64 mg/dL). Liver Function Tests: Recent Labs  Lab 10/25/19 0300 10/26/19 0322 10/27/19 0402 10/28/19 0343 10/30/19 0416  AST 15 15 12* 13* 14*  ALT _0 ALKPHOS 307* 263* 233* 216* 214*  BILITOT 0.6 1.1 0.8 0.6 0.8  PROT 6.3* 6.1* 6.0* 6.2* 6.3*  ALBUMIN 2.5* 2.5* 2.4* 2.5* 2.4*   Recent Labs  Lab 10/26/19 0322 10/27/19 0402 10/28/19 0343 10/29/19 0437 10/30/19 0416  LIPASE 52* 43 84* 57* 60*   No results for input(s): AMMONIA in the last 168 hours. Coagulation Profile: No results for input(s): INR, PROTIME in the last 168 hours. Cardiac Enzymes: No results for input(s): CKTOTAL, CKMB, CKMBINDEX, TROPONINI in the last 168 hours. BNP (last 3 results) No results for input(s): PROBNP in the last 8760 hours. HbA1C: No results for input(s): HGBA1C in the last 72  hours. CBG: Recent Labs  Lab 10/29/19 2016 10/29/19 2326 10/30/19 0506 10/30/19 0750 10/30/19 1145  GLUCAP 106* 104* 117* 110* 109*   Lipid Profile: No results for input(s): CHOL, HDL, LDLCALC, TRIG, CHOLHDL, LDLDIRECT in the last 72 hours. Thyroid Function Tests: No results for input(s): TSH, T4TOTAL, FREET4, T3FREE, THYROIDAB in the last 72 hours. Anemia Panel: No results for input(s): VITAMINB12, FOLATE, FERRITIN, TIBC, IRON, RETICCTPCT in the last 72 hours. Sepsis Labs: No results for input(s): PROCALCITON, LATICACIDVEN in the last 168 hours.  No results found for this or any previous visit (from the past 240 hour(s)).  Radiology Studies: No results found.   LOS: 13 days   Antonieta Pert, MD [facility]  10/30/2019, 2:05 PM

## 2019-10-30 NOTE — Progress Notes (Addendum)
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  Something happened with pain meds and went 8 hours without medication. Complains of ongoing "fullness" across mid abdomen.   Peripancreatic fluid collections had actually decreased in size on repeat CT scan on 7/8. Lipase 60 today. WBC remains normal.  Renal function normal. Tolerating solids. Continue Reglan.  --Loose stools resolved after starting pancreatic enzymes yesterday.  --Elevated alk phos, stable at 214 ( ? secondary to pancreatic head inflammation, ? stricture)   --Previous episodes of pancreatitis felt to be secondary to Etoh. Etiologies of more recent episodes unclear since abstinent from Etoh. She is s/p cholecystectomy. MRCP negative for biliary etiology.  Normal triglycerides and Ca+. Autoimmune labs negative. She was taking lasix at home and that has been linked to pancreatitis.  -Will need repeat imaging in 8-12 weeks to assess size / maturity of fluid collections.  --She is interested in going home soon. Let's reevaluate in am, maybe home tomorrow.        #Hepatomegaly/ hepaticsteatosis --No evidence for cirrhosis on imaging.  --mildascites present on imaging but may be related to pancreatitis. --platelets are normal.Minor elevation in INR to 1.4 ( possibly nutritional) --Albumin is low but could be reactive and also explained by poor nutrition --Long term she will need to lose weight to help with management of steatosis.       Attending Physician Note   I have taken an interval history, reviewed the chart and examined the patient. I  agree with the Advanced Practitioner's note, impression and recommendations.   Resolving recurrent pancreatitis with multiple fluid collections / forming pseudocysts. CT AP on 7/8 showed decreasing size of fluid collections / forming pseudocysts. Etiology unclear however has history of etoh and claims abstinence for 3 years. Lasix discontinued on admission as a potential cause. Genetic causes should be considered. She is tolerating a diet and pain is controlled today. If she continues to do well consider discharge home in next day or two. Follow up CT AP in 8-12 weeks.   Kara Edward, MD [facility]     SUBJECTIVE   Crying, had a bad night with abdominal discomfort. Tolerated solids for breakfast. Asking about discharge, wants to go home.     OBJECTIVE:     Vital signs in last 24 hours: Temp:  [98.4 F (36.9 C)-98.8 F (37.1 C)] 98.8 F (37.1 C) (07/13 0512) Pulse Rate:  [91-104] 104 (07/13 0512) Resp:  [18-20] 20 (07/13 0512) BP: (110-118)/(69-103) 118/103 (07/13 0512) SpO2:  [96 %-97 %] 96 % (07/13 0512) Weight:  [203 kg] 109 kg (07/13 0512) Last BM Date: 10/29/19 General:   Alert, in NAD Heart:  Regular rate and rhythm.  No lower extremity edema   Pulm: Normal respiratory effort   Abdomen:  Soft,  nontender, nondistended.  Normal bowel sounds.          Neurologic:  Alert and  oriented,  grossly normal neurologically. Psych:  Pleasant, cooperative.  Normal mood and affect.   Intake/Output from previous day: 07/12 0701 - 07/13 0700 In: 480 [P.O.:480] Out: -  Intake/Output this shift: No intake/output data recorded.  Lab Results: Recent Labs  10/30/19 0416  WBC 6.7  HGB 10.1*  HCT 32.5*  PLT 296   BMET Recent Labs    10/30/19 0416  NA 137  K 4.0  CL 99  CO2 27  GLUCOSE 109*  BUN <5*  CREATININE 0.64  CALCIUM 8.5*   LFT Recent Labs    10/28/19 0343 10/28/19 0343 10/30/19 0416  PROT 6.2*   < > 6.3*  ALBUMIN 2.5*   < > 2.4*    AST 13*   < > 14*  ALT 9   < > 9  ALKPHOS 216*   < > 214*  BILITOT 0.6   < > 0.8  BILIDIR 0.2  --   --   IBILI 0.4  --   --    < > = values in this interval not displayed.    Principal Problem:   Acute pancreatitis Active Problems:   Abdominal pain   Polyneuropathy   Benign essential HTN   Asthma, severe persistent, well-controlled   Pancreatic pseudocyst/cyst   Fatty liver   Malnutrition of moderate degree   Pancreatic pseudocyst   Elevated alkaline phosphatase level     LOS: 13 days   Kara [name] ,NP 10/30/2019, 9:39 AM

## 2019-10-31 LAB — GLUCOSE, CAPILLARY
Glucose-Capillary: 96 mg/dL (ref 70–99)
Glucose-Capillary: 100 mg/dL — ABNORMAL HIGH (ref 70–99)

## 2019-10-31 MED ORDER — PANCRELIPASE (LIP-PROT-AMYL) 24000-76000 UNITS PO CPEP: 24000.0000 [IU] | ORAL_CAPSULE | Freq: Three times a day (TID) | ORAL | 0 refills | Status: AC

## 2019-10-31 MED ORDER — OXYCODONE-ACETAMINOPHEN 5-325 MG PO TABS: 1.0000 | ORAL_TABLET | Freq: Four times a day (QID) | ORAL | 0 refills | Status: AC | PRN

## 2019-10-31 MED ORDER — METOCLOPRAMIDE HCL 10 MG PO TABS
10.0000 mg | ORAL_TABLET | Freq: Three times a day (TID) | ORAL | 0 refills | Status: DC | PRN
Start: 2019-10-31 — End: 2019-11-12

## 2019-10-31 NOTE — Progress Notes (Signed)
Progress Note  CC:   pancreatitis       ASSESSMENT AND PLAN:   Kara [name] a 34 y.o.femalePMH significant for, but not necessarily limited to, fatty liver, HTN, asthma ,obesity ,diastolic heart failure,splenic vein thrombosis, prolonged QT,alcoholism( remission), tobacco abuse,cholecystectomy  # Subacute pancreatitis, recurrent,  with multiple forming pseudocysts / weight loss --Peripancreatic fluid collections decreased in size on repeat CT scan 7/8. No labs today but yesterday Lipase 60. WBC normal.  Renal function normal. --Loose stools resolved after starting pancreatic enzymes yesterday.  --Elevated alk phos( ? secondary to pancreatic head inflammation, ? stricture)  --Etiology of pancreatitis? Previous episodes felt to be secondary to Etoh. Etiologies of more recent episodes unclear in absence of Etoh. She is s/p cholecystectomy. MRCP negative for biliary etiology. Normal triglycerides and Ca+. Autoimmune labs negative. Lasix stopped on admission as potential cause.   -Will need repeat imaging in 8-12 weeks to assess size / maturity of fluid collections. --Looks and feels much better today. Tolerating solids. Slept last night.  --[place] for discharge from GI standpoint.  --Upon discharge needs: Low fat diet. Can continue pancreatic enzymes upon discharge if insurance will pay. If not then can take Imodium 1-2 times a day if needed. Unclear at this point if she has chronic pancreatitis but it not then shouldn't need them long term anyway. Discussed use of narcotics, encouraged very judicious use to avoid dependency. She will keep scheduled follow up with [place] the end of the month but call in interim for questions.  Marland Kitchen    #Hepatomegaly/ hepaticsteatosis --No evidence for cirrhosis on imaging.  --mildascites present on imaging but may be related to pancreatitis. --platelets are normal.Minor elevation in INR to 1.4 ( possibly nutritional) --Albumin is low but  could be reactive and also explained by poor nutrition --Long term she will need to lose weight to help with management of steatosis     SUBJECTIVE    Feels better today, ate breakfast. Slept well.    OBJECTIVE:     Vital signs in last 24 hours: Temp:  [98.1 F (36.7 C)-98.4 F (36.9 C)] 98.4 F (36.9 C) (07/13 1942) Pulse Rate:  [84-88] 84 (07/13 1942) Resp:  [17-20] 17 (07/13 1942) BP: (119-124)/(86-91) 124/91 (07/13 1942) SpO2:  [94 %-96 %] 96 % (07/14 0818) Last BM Date: 10/29/19 General:   Alert, in NAD Heart:  Regular rate and rhythm.  No lower extremity edema   Pulm: Normal respiratory effort   Abdomen:  Soft,  nontender, nondistended.  Normal bowel sounds.          Neurologic:  Alert and  oriented,  grossly normal neurologically. Psych:  Pleasant, cooperative.  Normal mood and affect.   Intake/Output from previous day: 07/13 0701 - 07/14 0700 In: 1298 [P.O.:1298] Out: -  Intake/Output this shift: No intake/output data recorded.  Lab Results: Recent Labs    10/30/19 0416  WBC 6.7  HGB 10.1*  HCT 32.5*  PLT 296   BMET Recent Labs    10/30/19 0416  NA 137  K 4.0  CL 99  CO2 27  GLUCOSE 109*  BUN <5*  CREATININE 0.64  CALCIUM 8.5*   LFT Recent Labs    10/30/19 0416  PROT 6.3*  ALBUMIN 2.4*  AST 14*  ALT 9  ALKPHOS 214*  BILITOT 0.8   PT/INR No results for input(s): LABPROT, INR in the last 72 hours. Hepatitis Panel No results for input(s): HEPBSAG, HCVAB, HEPAIGM, HEPBIGM in the last 72  hours.  No results found.   Principal Problem:   Acute pancreatitis Active Problems:   Abdominal pain   Polyneuropathy   Benign essential HTN   Asthma, severe persistent, well-controlled   Pancreatic pseudocyst/cyst   Fatty liver   Malnutrition of moderate degree   Pancreatic pseudocyst   Elevated alkaline phosphatase level     LOS: 14 days   [name] ,NP 10/31/2019, 9:53 AM

## 2019-10-31 NOTE — Plan of Care (Signed)
Pt adequate for discharge

## 2019-10-31 NOTE — Discharge Instructions (Signed)
Acute Pancreatitis    Acute pancreatitis happens when the pancreas gets swollen. The pancreas is a large gland in the body that helps to control blood sugar. It also makes enzymes that help to digest food.  This condition can last a few days and cause serious problems. The lungs, heart, and kidneys may stop working.  What are the causes?  Causes include:  · Alcohol abuse.  · Drug abuse.  · Gallstones.  · A tumor in the pancreas.  Other causes include:  · Some medicines.  · Some chemicals.  · Diabetes.  · An infection.  · Damage caused by an accident.  · The poison (venom) from a scorpion bite.  · Belly (abdominal) surgery.  · The body's defense system (immune system) attacking the pancreas (autoimmune pancreatitis).  · Genes that are passed from parent to child (inherited).  In some cases, the cause is not known.  What are the signs or symptoms?  · Pain in the upper belly that may be felt in the back. The pain may be very bad.  · Swelling of the belly.  · Feeling sick to your stomach (nauseous) and throwing up (vomiting).  · Fever.  How is this treated?  You will likely have to stay in the hospital. Treatment may include:  · Pain medicine.  · Fluid through an IV tube.  · Placing a tube in the stomach to take out the stomach contents. This may help you stop throwing up.  · Not eating for 3-4 days.  · Antibiotic medicines, if you have an infection.  · Treating any other problems that may be the cause.  · Steroid medicines, if your problem is caused by your defense system attacking your body's own tissues.  · Surgery.  Follow these instructions at home:  Eating and drinking    · Follow instructions from your doctor about what to eat and drink.  · Eat foods that do not have a lot of fat in them.  · Eat small meals often. Do not eat big meals.  · Drink enough fluid to keep your pee (urine) pale yellow.  · Do not drink alcohol if it caused your condition.  Medicines  · Take over-the-counter and prescription medicines only  as told by your doctor.  · Ask your doctor if the medicine prescribed to you:  ? Requires you to avoid driving or using heavy machinery.  ? Can cause trouble pooping (constipation). You may need to take steps to prevent or treat trouble pooping:  § Take over-the-counter or prescription medicines.  § Eat foods that are high in fiber. These include beans, whole grains, and fresh fruits and vegetables.  § Limit foods that are high in fat and sugar. These include fried or sweet foods.  General instructions  · Do not use any products that contain nicotine or tobacco, such as cigarettes, e-cigarettes, and chewing tobacco. If you need help quitting, ask your doctor.  · Get plenty of rest.  · Check your blood sugar at home as told by your doctor.  · Keep all follow-up visits as told by your doctor. This is important.  Contact a doctor if:  · You do not get better as quickly as expected.  · You have new symptoms.  · Your symptoms get worse.  · You have pain or weakness that lasts a long time.  · You keep feeling sick to your stomach.  · You get better and then you have pain again.  ·   You have a fever.  Get help right away if:  · You cannot eat or keep fluids down.  · Your pain gets very bad.  · Your skin or the white part of your eyes turns yellow.  · You have sudden swelling in your belly.  · You throw up.  · You feel dizzy or you pass out (faint).  · Your blood sugar is high (over 300 mg/dL).  Summary  · Acute pancreatitis happens when the pancreas gets swollen.  · This condition is often caused by alcohol abuse, drug abuse, or gallstones.  · You will likely have to stay in the hospital for treatment.  This information is not intended to replace advice given to you by your health care provider. Make sure you discuss any questions you have with your health care provider.  Document Revised: 01/23/2018 Document Reviewed: 01/23/2018  [employer] Patient Education © 2020 [employer]

## 2019-10-31 NOTE — Discharge Summary (Signed)
Physician Discharge Summary  Kara [name] ZOX:[id number] DOB: 12/05/1985 DOA: 10/16/2019  PCP: Lawerance Cruel, MD  Admit date: 10/16/2019 Discharge date: 10/31/2019  Admitted From: Home Disposition: Home  Recommendations for Outpatient Follow-up:  1. Follow up with PCP in 1-2 weeks 2. Follow-up with gastroenterologist as scheduled 3. Please follow up on the following pending results:  Home Health:no  Equipment/Devices: None  Discharge Condition: Stable Code Status: Full code Diet recommendation:  Diet Order            Diet - low sodium heart healthy           Diet Heart Room service appropriate? Yes; Fluid consistency: Thin  Diet effective now                  Brief/Interim Summary:  34 year old with history of hypertension, asthma, previously alcoholic who quit for 3 years and history of alcohol induced pancreatitis, cholecystectomy after an episode of pancreatitis last year, last exacerbation in May 2020 and none since then presents with worsening abdominal pain. Ongoing for about 2 months but worse for 2 days. In the emergency room, tachycardic, afebrile, WBC 13.5. Renal functions normal. Calcium normal. MRI of the abdomen shows multiple uncomplicated pancreatic cyst and acute inflammation on the head of the pancreas. Lipase is 128. Patient was admitted for further management. Patient had NG tube placed and placed on tube feeding diet however did not tolerate well after few days-NGT were changed to IR feeding tube placed distal to ligament of Treitz-but the tube came out repeat CT abdomen was ordered that showed improving fluid collection and pancreatitis and patient was placed on clear liquid diet 7/9. Patient was continued on liquid diet being advanced slowly overall improving Diet was advanced to heart healthy diet and has been tolerating for few days and medication going down from IV to p.o. Seen by GI this morning patient is very quiet no nausea vomiting minimal  abdominal pain and wants to go home today. GI is okay with her discharge today. She is being discharged with few days of oral Percocet to manage her pain but has been counseled extensively to minimize narcotic use, and use in bare minimum only for sever pain   Discharge Diagnoses:  Principal Problem:   Acute pancreatitis Active Problems:   Abdominal pain   Polyneuropathy   Benign essential HTN   Asthma, severe persistent, well-controlled   Pancreatic pseudocyst/cyst   Fatty liver   Malnutrition of moderate degree   Pancreatic pseudocyst   Elevated alkaline phosphatase level  Acute pancreatitis, recurrent, with multiple pancreatic, peripancreatic fluid collection, pseudocyst largest 7.1 X5.1X 5.8 cm: Alcohol was felt to be the etiology and previous pancreatitis but patient reports abstinence for 3 years, had lap chole in March 2020.  MRCP negative for biliary etiology.  Seen by GI, had  further work-up with IgG4 normal LFTs stable.  Attempted NGT feeding but did not tolerate after few days then IR placed tube feed past ligament of Treitz but came off late in the evening.  Repeat CT abdomen was obtained that showed improving inflammation and fluid collection.   Diet was slowly advanced at this time pain is improved tolerating diet she has been decided few days of oral Percocet, as needed Reglan, Creon.  She already has a follow-up scheduled by GI.  She was seen by GI this morning and okay for discharge.She will need repeat imaging in 8 to 12 weeks to assess fluid collections.    Elevated alk phos and  GGT MRCP without biliary duct dilation,likely secondary to biliary status due to pancreatic inflammation.  Anemia likely from chronic disease hemoglobin is-stable follow-up with outpatient.  Hypoalbuminemia due to decreased oral intake, #1 augment nutrition.  Leukocytosis: Resolved  Polyneuropathy-on Neurontin Benign essential HTN: BP is controlled on metoprolol.   History of asthma,  severe persistent: No shortness of breath or wheezing.   Hypomagnesemia/hypokalemia: Labs stable Hepatomegaly/hepatic steatosis no evidence of cirrhosis.  Follow-up with GI as outpatient. Morbid obesity with BMI 44: BMI at 41.  She will benefit with outpatient follow-up with PCP weight loss.     Cont home:sertraline,seroquel and bupropion.   Consults:  Gastroenterology  Subjective: Resting well, tolerating diet, no nausea vomiting.  Pain is controlled on oral medication. Discharge Exam: Vitals:   10/31/19 0816 10/31/19 0818  BP:    Pulse:    Resp:    Temp:    SpO2: 96% 96%   General: Pt is alert, awake, not in acute distress Cardiovascular: RRR, S1/S2 +, no rubs, no gallops Respiratory: CTA bilaterally, no wheezing, no rhonchi Abdominal: Soft, NT, ND, bowel sounds + Extremities: no edema, no cyanosis  Discharge Instructions  Discharge Instructions    Diet - low sodium heart healthy   Complete by: As directed    Low fat diet. Can continue pancreatic enzymes upon discharge if insurance will pay.   Discharge instructions   Complete by: As directed    Please call call MD or return to ER for similar or worsening recurring problem that brought you to hospital or if any fever,nausea/vomiting,abdominal pain, uncontrolled pain, chest pain,  shortness of breath or any other alarming symptoms.  Please follow-up your doctor as instructed in a week time and call the office for appointment.  Please avoid alcohol, smoking, or any other illicit substance and maintain healthy habits including taking your regular medications as prescribed.  Cont with Low fat diet. Can continue pancreatic enzymes upon discharge if insurance will pay. If not then can take Imodium 1-2 times a day if needed.  Please minimize use of narcotics due to associated risks and try nonnarcotic pain medication/tylenol etcs and follow up with gi if further pain  You were cared for by a hospitalist during your hospital  stay. If you have any questions about your discharge medications or the care you received while you were in the hospital after you are discharged, you can call the unit and ask to speak with the hospitalist on call if the hospitalist that took care of you is not available.  Once you are discharged, your primary care physician will handle any further medical issues. Please note that NO REFILLS for any discharge medications will be authorized once you are discharged, as it is imperative that you return to your primary care physician (or establish a relationship with a primary care physician if you do not have one) for your aftercare needs so that they can reassess your need for medications and monitor your lab values.   Increase activity slowly   Complete by: As directed      Allergies as of 10/31/2019      Reactions   Nsaids Other (See Comments)   Upset stomach    Peanuts [peanut Oil] Hives, Itching   "ONLY TREE NUTS"      Medication List    TAKE these medications   albuterol (2.5 MG/3ML) 0.083% nebulizer solution Commonly known as: PROVENTIL Take 3 mLs (2.5 mg total) by nebulization every 4 (four) hours as needed for wheezing  or shortness of breath.   albuterol 108 (90 Base) MCG/ACT inhaler Commonly known as: VENTOLIN HFA 2 puffs every 4 hours as needed for coughing or wheezing.   Breo Ellipta 200-25 MCG/INH Aepb Generic drug: fluticasone furoate-vilanterol INHALE 1 PUFF INTO THE LUNGS DAILY   buPROPion 150 MG 12 hr tablet Commonly known as: WELLBUTRIN SR Take 150-300 mg by mouth See admin instructions. Takes 1 tablet in the morning and 2 tablets at bedtime   chlorhexidine 0.12 % solution Commonly known as: PERIDEX Use as directed 15 mLs in the mouth or throat 2 (two) times daily. After meals What changed:   when to take this  reasons to take this   Dupixent 300 MG/2ML prefilled syringe Generic drug: dupilumab INJECT 300 MG SUBCUTANEOUSLY EVERY OTHER WEEK What changed:  See the new instructions.   EPINEPHrine 0.3 mg/0.3 mL Soaj injection Commonly known as: EpiPen 2-Pak Inject 0.3 mLs (0.3 mg total) into the muscle once as needed (for severe allergic reaction).   furosemide 20 MG tablet Commonly known as: LASIX Take 1 tablet (20 mg total) by mouth daily. What changed:   when to take this  reasons to take this   gabapentin 300 MG capsule Commonly known as: NEURONTIN TAKE 3 CAPSULES BY MOUTH TWICE DAILY   Incruse Ellipta 62.5 MCG/INH Aepb Generic drug: umeclidinium bromide Inhale 1 puff into the lungs daily.   Melatonin 10 MG Tabs Take 10 mg by mouth at bedtime.   metoCLOPramide 10 MG tablet Commonly known as: Reglan Take 1 tablet (10 mg total) by mouth every 8 (eight) hours as needed for up to 12 doses for nausea.   metoprolol succinate 50 MG 24 hr tablet Commonly known as: TOPROL-XL Take 1 tablet (50 mg total) by mouth daily.   montelukast 10 MG tablet Commonly known as: SINGULAIR TAKE 1 TABLET(10 MG) BY MOUTH AT BEDTIME What changed: See the new instructions.   multivitamin with minerals tablet Take 1 tablet by mouth daily.   omeprazole 40 MG capsule Commonly known as: PRILOSEC TAKE 1 CAPSULE(40 MG) BY MOUTH DAILY What changed:   how much to take  how to take this  when to take this  additional instructions   oxyCODONE-acetaminophen 5-325 MG tablet Commonly known as: PERCOCET/ROXICET Take 1 tablet by mouth every 6 (six) hours as needed for up to 3 days for moderate pain.   Pancrelipase (Lip-Prot-Amyl) 24000-76000 units Cpep Take 1 capsule (24,000 Units total) by mouth 3 (three) times daily before meals.   QUEtiapine 100 MG tablet Commonly known as: SEROQUEL Take 100 mg by mouth at bedtime.   rosuvastatin 10 MG tablet Commonly known as: CRESTOR Take 10 mg by mouth daily.   sertraline 50 MG tablet Commonly known as: ZOLOFT Take 75 mg by mouth daily.   thiamine 100 MG tablet Take 100 mg by mouth daily.    VITAMIN D PO Take 1 tablet by mouth daily.       Follow-up Information    Lawerance Cruel, MD Follow up in 1 week(s).   Specialty: Family Medicine Contact information: [place] [address] [phone]        Josue Hector, MD .   Specialty: Cardiology Contact information: [phone] [address] [place] [address] [phone]        Thornton Park, MD. Call.   Specialty: Gastroenterology Contact information: [place] [address] [phone]  Allergies  Allergen Reactions  . Nsaids Other (See Comments)    Upset stomach   . Peanuts [Peanut Oil] Hives and Itching    "ONLY TREE NUTS"    The results of significant diagnostics from this hospitalization (including imaging, microbiology, ancillary and laboratory) are listed below for reference.    Microbiology: No results found for this or any previous visit (from the past 240 hour(s)).  Procedures/Studies: CT ABDOMEN PELVIS W CONTRAST  Result Date: 10/25/2019 CLINICAL DATA:  34 year old female with acute pancreatitis, nausea vomiting. EXAM: CT ABDOMEN AND PELVIS WITH CONTRAST TECHNIQUE: Multidetector CT imaging of the abdomen and pelvis was performed using the standard protocol following bolus administration of intravenous contrast. CONTRAST:  12m OMNIPAQUE IOHEXOL 300 MG/ML  SOLN COMPARISON:  CT abdomen pelvis dated 10/16/2019. FINDINGS: Lower chest: There is a small left pleural effusion. There is partial consolidative changes of the left lung base which may represent atelectasis or infiltrate. Clinical correlation is recommended. No intra-abdominal free air. Upper abdominal edema and small ascites. Hepatobiliary: Fatty infiltration of the liver. No intrahepatic biliary ductal dilatation. Cholecystectomy. No retained calcified stone noted in the central CBD. Pancreas: Inflammatory changes of the pancreas in keeping with known acute pancreatitis.  Small focal ill-defined hypodense area in the uncinate process and distal body/tail of the pancreas appears similar to prior CT. These measure up to approximately 13 mm in the distal pancreas and likely represent focal area of edema or small pseudocysts. There is overall pancreatic atrophy for the patient's age. Diffuse upper abdominal edema has progressed since the prior CT. There is however interval decrease in the size of a loculated collection or pseudocyst in the left upper abdomen now measuring approximately 3.9 x 2.7 cm (previously 7.1 x 5.1 cm. The second fluid collection seen anterior to the stomach and the prior CT is not visualized on today's exam. An additional loculated collection along the left hemidiaphragm has decreased in size now measuring 2.7 x 1.6 cm (14/2). Spleen: Top-normal spleen size. Adrenals/Urinary Tract: The adrenal glands are grossly unremarkable. There is no hydronephrosis on either side. The visualized ureters and urinary bladder appear unremarkable. Stomach/Bowel: There is inflammatory changes and thickening of the distal transverse colon likely reactive to inflammatory changes of the pancreas. There is no bowel obstruction. The appendix is normal. Vascular/Lymphatic: The abdominal aorta and IVC are unremarkable. The SMV and main portal vein are patent. 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 No portal venous gas. There is no adenopathy. Reproductive: The uterus is grossly unremarkable. No adnexal masses. Other: Loculated fluid collection in the left mid abdomen anterior to the psoas muscle similar to prior CT. Attention on follow-up imaging recommended. Musculoskeletal: No acute or significant osseous findings. IMPRESSION: 1. Acute pancreatitis with overall interval decrease in the size of the loculated collections or pseudocyst in the left upper abdomen. 2. 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 3. Small left pleural  effusion with partial consolidative changes of the left lung base which may represent atelectasis or infiltrate. Clinical correlation is recommended. 4. Fatty liver. 5. No bowel obstruction. Normal appendix. 6. Segmental inflammatory changes and thickening of the distal transverse colon likely reactive to acute pancreatitis. Electronically Signed   By: AAnner CreteM.D.   On: 10/25/2019 19:22   CT Abdomen Pelvis W Contrast  Result Date: 10/17/2019 CLINICAL DATA:  Acute nonlocalized abdominal pain. Patient reports decreased appetite and weight loss. EXAM: CT ABDOMEN AND PELVIS WITH CONTRAST TECHNIQUE: Multidetector CT imaging  of the abdomen and pelvis was performed using the standard protocol following bolus administration of intravenous contrast. CONTRAST:  139m OMNIPAQUE IOHEXOL 300 MG/ML  SOLN COMPARISON:  Abdominal ultrasound 05/24/2018.  CT 09/09/2015 FINDINGS: Lower chest: Elevated left hemidiaphragm. Left basilar consolidation likely compressive atelectasis. Hepatobiliary: Diffusely decreased hepatic density consistent with steatosis. Liver is enlarged spanning 24 cm cranial caudal. No evidence of focal hepatic lesion. Clips in the gallbladder fossa postcholecystectomy. No biliary dilatation. Pancreas: Edematous appearance of the pancreatic head and uncinate process. Low-density in the uncinate process measuring 10 mm suspicious for developing intrapancreatic pseudocyst. 17 mm hypodensity in the pancreatic tail likely pseudocyst. Peripancreatic fat stranding extending along the proximal body and to a lesser extent the tail. There multiple ill-defined peripherally enhancing fluid collections in the left upper quadrant. A crescentic fluid collection courses anteriorly, posteriorly, and anterior superior to the spleen. Superior component measures 6.2 x 2.9 x 2.4 cm under the left hemidiaphragm, series 2, image 9. Collection immediately anterior to the spleen measures 7.1 x 5.1 x 5.8 cm, series 2 image  27. There is likely thin contiguous component to an ellipsoid fluid collection more anteriorly measuring 6 x 2.0 x 3.2 cm, series 2, image 27. Spleen: Perisplenic fluid collections as described. 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 No intrasplenic fluid collection. Upper normal spleen size. Adrenals/Urinary Tract: Normal adrenal glands. No hydronephrosis or perinephric edema. Homogeneous renal enhancement. Urinary bladder is nondistended. Stomach/Bowel: Stomach partially distended. Mild duodenal wall thickening/stranding likely reactive. There is no small bowel distention or obstruction. Scattered fluid-filled colon without colonic wall thickening or inflammation. Vascular/Lymphatic: Patent portal vein. 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 No adenopathy. Reproductive: Uterus and ovaries are unremarkable. Other: Small amount of upper abdominal ascites. Small to moderate volume of non organized free fluid in the pelvis, likely tracking from the pericolic gutters. Fluid collection anterior to the left psoas muscle is diminished in size from 2017, currently measuring 4.1 x 2.6 cm, previously 6.6 x 3.8 cm. This is adjacent embolization coils and likely sequela of prior retroperitoneal hemorrhage. There is no free air. Musculoskeletal: There are no acute or suspicious osseous abnormalities. IMPRESSION: 1. Acute edematous appearance of the pancreatic head and uncinate process, suspicious for acute pancreatitis. Low-density in the uncinate process measuring 10 mm in 17 mm area of low-density in the pancreatic tail likely developing intrapancreatic pseudocysts. 2. Multiple ill-defined peripherally enhancing fluid collections in the left upper quadrant, largest measuring 7.1 x 5.1 x 5.8 cm. Suspect pseudocysts, new from 2017 exam. 3. 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 4. Small amount of ascites in the upper abdomen.  Small to moderate volume of non organized free fluid in the pelvis, likely tracking from the pericolic gutters. 5. Hepatomegaly and hepatic steatosis. 6. Decreased size of fluid collection anterior to the left psoas muscle, currently measuring 4.1 x 2.6 cm, previously 6.6 x 3.8 cm. This is adjacent embolization coils and likely sequela of prior retroperitoneal hemorrhage. 7. Elevated left hemidiaphragm with left basilar consolidation likely compressive atelectasis. Electronically Signed   By: MKeith RakeM.D.   On: 10/17/2019 00:19   MR ABDOMEN MRCP WO CONTRAST  Result Date: 10/17/2019 CLINICAL DATA:  Pancreatitis, pancreatic pseudocysts EXAM: MRI ABDOMEN WITHOUT CONTRAST  (INCLUDING MRCP) TECHNIQUE: Multiplanar multisequence MR imaging of the abdomen was performed. Heavily T2-weighted images of the biliary and pancreatic ducts were obtained, and three-dimensional MRCP images were rendered by post processing. COMPARISON:  CT abdomen pelvis, 10/16/2019 FINDINGS: Lower  chest: No acute findings. Hepatobiliary: Hepatomegaly, maximum span 23.6 cm. Hepatic steatosis. No mass or other parenchymal abnormality identified. Status post cholecystectomy. No biliary ductal dilatation. Pancreas: Mild inflammatory stranding about the pancreas. The pancreatic duct is nondilated. There is an ill-defined area of fluid signal within the pancreatic head measuring approximately 1.3 cm (series 12, image 28). Spleen:  Mild splenomegaly, maximum span 13.3 cm. Adrenals/Urinary Tract: No masses identified. No evidence of hydronephrosis. Stomach/Bowel: Visualized portions within the abdomen are unremarkable. Vascular/Lymphatic: No pathologically enlarged lymph nodes identified. No abdominal aortic aneurysm demonstrated. Other: Trace perihepatic and perisplenic ascites. There are multiple, thick-walled loculated appearing fluid collections in the left upper quadrant, posterior to the gastric body, superior to the spleen, anterior to  the stomach, and the largest anterior to the spleen measuring 7.1 x 4.4 cm (series 12, image 21). Musculoskeletal: No suspicious bone lesions identified. IMPRESSION: 1. Mild inflammatory stranding about the pancreas, consistent with acute pancreatitis. 2. There is an ill-defined area of fluid signal within the pancreatic head measuring approximately 1.3 cm. This may represent a small developing acute pancreatic fluid collection. 3. 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 The presence or absence of infection cannot be established by imaging. 4. No pancreatic or biliary ductal dilatation. 5. Trace perihepatic and perisplenic ascites. 6. Mild splenomegaly. 7. Hepatomegaly and hepatic steatosis. 8. Status post cholecystectomy. Electronically Signed   By: Eddie Candle M.D.   On: 10/17/2019 08:40   MR 3D Recon At Scanner  Result Date: 10/17/2019 CLINICAL DATA:  Pancreatitis, pancreatic pseudocysts EXAM: MRI ABDOMEN WITHOUT CONTRAST  (INCLUDING MRCP) TECHNIQUE: Multiplanar multisequence MR imaging of the abdomen was performed. Heavily T2-weighted images of the biliary and pancreatic ducts were obtained, and three-dimensional MRCP images were rendered by post processing. COMPARISON:  CT abdomen pelvis, 10/16/2019 FINDINGS: Lower chest: No acute findings. Hepatobiliary: Hepatomegaly, maximum span 23.6 cm. Hepatic steatosis. No mass or other parenchymal abnormality identified. Status post cholecystectomy. No biliary ductal dilatation. Pancreas: Mild inflammatory stranding about the pancreas. The pancreatic duct is nondilated. There is an ill-defined area of fluid signal within the pancreatic head measuring approximately 1.3 cm (series 12, image 28). Spleen:  Mild splenomegaly, maximum span 13.3 cm. Adrenals/Urinary Tract: No masses identified. No evidence of hydronephrosis. Stomach/Bowel: Visualized portions within the abdomen are  unremarkable. Vascular/Lymphatic: No pathologically enlarged lymph nodes identified. No abdominal aortic aneurysm demonstrated. Other: Trace perihepatic and perisplenic ascites. There are multiple, thick-walled loculated appearing fluid collections in the left upper quadrant, posterior to the gastric body, superior to the spleen, anterior to the stomach, and the largest anterior to the spleen measuring 7.1 x 4.4 cm (series 12, image 21). Musculoskeletal: No suspicious bone lesions identified. IMPRESSION: 1. Mild inflammatory stranding about the pancreas, consistent with acute pancreatitis. 2. There is an ill-defined area of fluid signal within the pancreatic head measuring approximately 1.3 cm. This may represent a small developing acute pancreatic fluid collection. 3. 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 The presence or absence of infection cannot be established by imaging. 4. No pancreatic or biliary ductal dilatation. 5. Trace perihepatic and perisplenic ascites. 6. Mild splenomegaly. 7. Hepatomegaly and hepatic steatosis. 8. Status post cholecystectomy. Electronically Signed   By: Eddie Candle M.D.   On: 10/17/2019 08:40   DG Abd Portable 1V  Result Date: 10/22/2019 CLINICAL DATA:  Dobbhoff tube placement EXAM: PORTABLE ABDOMEN - 1 VIEW COMPARISON:  None. FINDINGS: Dobbhoff tube is  identified with distal tip in the mid stomach. Patchy opacity of left lung base is identified. The heart size is enlarged. IMPRESSION: Dobbhoff tube is identified with distal tip in the mid stomach. Electronically Signed   By: Abelardo Diesel M.D.   On: 10/22/2019 14:35   DG Loyce Dys Tube Plc W/Fl W/Rad  Result Date: 10/25/2019 INDICATION: 34 year old female with pancreatitis. Request was made to reposition the patient's feeding tube in a post pyloric position. EXAM: RF-NASOGASTRIC TUBE PLACEMENT WITH FL AND WITH RAD CONTRAST:  No contrast was used  during the examination. MEDICATIONS: Viscous lidocaine. ANESTHESIA/SEDATION: None. FLUOROSCOPY TIME:  Fluoroscopy Time: 1.4 sec Radiation Exposure Index (if provided by the fluoroscopic device): 45.10 mGy Number of Acquired Spot Images: 2 COMPLICATIONS: None immediate. PROCEDURE: The patient's existing feeding tube was removed due to poor condition of the outside coding. A new 10 [place] Dobhoff tube was lubricated with viscous lidocaine and inserted into the left nostril. Under intermittent fluoroscopic guidance, the Dobhoff tube was advanced through the stomach, through the duodenum with tip ultimately terminating over the third portion of the duodenum. A spot fluoroscopic image was saved for documentation purposes. The tube was affixed to the patient's nose with tape. The patient tolerated the procedure well without immediate postprocedural complication. IMPRESSION: Satisfactory placement of a nasogastric tube with tip in the third portion of the duodenum. Electronically Signed   By: Audie Pinto M.D.   On: 10/25/2019 12:42    Labs: BNP (last 3 results) No results for input(s): BNP in the last 8760 hours. Basic Metabolic Panel: Recent Labs  Lab 10/25/19 0300 10/26/19 0322 10/27/19 0402 10/30/19 0416  NA 136 134* 132* 137  K 3.5 3.7 4.2 4.0  CL 99 99 101 99  CO2 '28 26 25 27  '$ GLUCOSE 119* 109* 123* 109*  BUN <5* <5* <5* <5*  CREATININE 0.54 0.61 0.59 0.64  CALCIUM 8.7* 8.4* 8.2* 8.5*   Liver Function Tests: Recent Labs  Lab 10/25/19 0300 10/26/19 0322 10/27/19 0402 10/28/19 0343 10/30/19 0416  AST 15 15 12* 13* 14*  ALT '12 11 9 9 9  '$ ALKPHOS 307* 263* 233* 216* 214*  BILITOT 0.6 1.1 0.8 0.6 0.8  PROT 6.3* 6.1* 6.0* 6.2* 6.3*  ALBUMIN 2.5* 2.5* 2.4* 2.5* 2.4*   Recent Labs  Lab 10/26/19 0322 10/27/19 0402 10/28/19 0343 10/29/19 0437 10/30/19 0416  LIPASE 52* 43 84* 57* 60*   No results for input(s): AMMONIA in the last 168 hours. CBC: Recent Labs  Lab  10/25/19 0300 10/26/19 0322 10/27/19 0402 10/30/19 0416  WBC 7.2 7.7 8.2 6.7  HGB 9.6* 9.7* 9.4* 10.1*  HCT 31.5* 32.1* 30.9* 32.5*  MCV 92.4 92.5 91.4 91.3  PLT 252 239 250 296   Cardiac Enzymes: No results for input(s): CKTOTAL, CKMB, CKMBINDEX, TROPONINI in the last 168 hours. BNP: Invalid input(s): POCBNP CBG: Recent Labs  Lab 10/30/19 1612 10/30/19 1940 10/30/19 2318 10/31/19 0346 10/31/19 0724  GLUCAP 96 98 98 96 100*   D-Dimer No results for input(s): DDIMER in the last 72 hours. Hgb A1c No results for input(s): HGBA1C in the last 72 hours. Lipid Profile No results for input(s): CHOL, HDL, LDLCALC, TRIG, CHOLHDL, LDLDIRECT in the last 72 hours. Thyroid function studies No results for input(s): TSH, T4TOTAL, T3FREE, THYROIDAB in the last 72 hours.  Invalid input(s): FREET3 Anemia work up No results for input(s): VITAMINB12, FOLATE, FERRITIN, TIBC, IRON, RETICCTPCT in the last 72 hours. Urinalysis    Component Value Date/Time  COLORURINE YELLOW 10/17/2019 0628   APPEARANCEUR CLOUDY (A) 10/17/2019 0628   LABSPEC <1.005 (L) 10/17/2019 0628   PHURINE 6.5 10/17/2019 0628   GLUCOSEU NEGATIVE 10/17/2019 0628   HGBUR TRACE (A) 10/17/2019 0628   BILIRUBINUR SMALL (A) 10/17/2019 0628   KETONESUR NEGATIVE 10/17/2019 0628   PROTEINUR 100 (A) 10/17/2019 0628   UROBILINOGEN 1.0 02/16/2015 1231   NITRITE NEGATIVE 10/17/2019 0628   LEUKOCYTESUR NEGATIVE 10/17/2019 0628   Sepsis Labs Invalid input(s): PROCALCITONIN,  WBC,  LACTICIDVEN Microbiology No results found for this or any previous visit (from the past 240 hour(s)).   Time coordinating discharge: 35  minutes  SIGNED: Antonieta Pert, MD  [facility] 10/31/2019, 11:13 AM  If 7PM-7AM, please contact night-coverage [email or web address]

## 2019-10-31 NOTE — Progress Notes (Signed)
Received order to discharge patient.  Discharge instructions, follow up appointments, and medication reviewed with patient who verbalizes understanding.

## 2019-11-01 ENCOUNTER — Other Ambulatory Visit: Payer: Self-pay

## 2019-11-01 ENCOUNTER — Encounter (HOSPITAL_COMMUNITY): Payer: Self-pay

## 2019-11-01 ENCOUNTER — Emergency Department (HOSPITAL_COMMUNITY)
Admission: EM | Admit: 2019-11-01 | Discharge: 2019-11-02 | Disposition: A | Discharge status: Home / Self Care | Payer: BC Managed Care – PPO | Attending: Emergency Medicine | Admitting: Emergency Medicine

## 2019-11-01 DIAGNOSIS — R1010 Upper abdominal pain, unspecified: Secondary | ICD-10-CM | POA: Insufficient documentation

## 2019-11-01 DIAGNOSIS — R1110 Vomiting, unspecified: Secondary | ICD-10-CM | POA: Diagnosis not present

## 2019-11-01 DIAGNOSIS — Z5321 Procedure and treatment not carried out due to patient leaving prior to being seen by health care provider: Secondary | ICD-10-CM | POA: Diagnosis not present

## 2019-11-01 LAB — COMPREHENSIVE METABOLIC PANEL
ALT: 9 U/L (ref 0–44)
AST: 15 U/L (ref 15–41)
Albumin: 2.8 g/dL — ABNORMAL LOW (ref 3.5–5.0)
Alkaline Phosphatase: 210 U/L — ABNORMAL HIGH (ref 38–126)
Anion gap: 15 (ref 5–15)
BUN: 5 mg/dL — ABNORMAL LOW (ref 6–20)
CO2: 24 mmol/L (ref 22–32)
Calcium: 8.9 mg/dL (ref 8.9–10.3)
Chloride: 99 mmol/L (ref 98–111)
Creatinine, Ser: 0.55 mg/dL (ref 0.44–1.00)
GFR calc Af Amer: >60 mL/min (ref >60)
GFR calc non Af Amer: >60 mL/min (ref >60)
Glucose, Bld: 107 mg/dL — ABNORMAL HIGH (ref 70–99)
Potassium: 3.8 mmol/L (ref 3.5–5.1)
Sodium: 138 mmol/L (ref 135–145)
Total Bilirubin: 0.4 mg/dL (ref 0.3–1.2)
Total Protein: 7.4 g/dL (ref 6.5–8.1)

## 2019-11-01 LAB — CBC
HCT: 35.7 % — ABNORMAL LOW (ref 36.0–46.0)
Hemoglobin: 11.1 g/dL — ABNORMAL LOW (ref 12.0–15.0)
MCH: 27 pg (ref 26.0–34.0)
MCHC: 31.1 g/dL (ref 30.0–36.0)
MCV: 86.9 fL (ref 80.0–100.0)
Platelets: 546 10*3/uL — ABNORMAL HIGH (ref 150–400)
RBC: 4.11 MIL/uL (ref 3.87–5.11)
RDW: 13.2 % (ref 11.5–15.5)
WBC: 9 10*3/uL (ref 4.0–10.5)
nRBC: 0 % (ref 0.0–0.2)

## 2019-11-01 LAB — I-STAT BETA HCG BLOOD, ED (MC, WL, AP ONLY)
I-stat hCG, quantitative: <5 m[IU]/mL (ref <5)
I-stat hCG, quantitative: <5 m[IU]/mL (ref <5)

## 2019-11-01 LAB — LIPASE, BLOOD: Lipase: 139 U/L — ABNORMAL HIGH (ref 11–51)

## 2019-11-01 MED ORDER — SODIUM CHLORIDE 0.9% FLUSH: 3.0000 mL | Freq: Once | INTRAVENOUS | Status: DC

## 2019-11-01 NOTE — ED Triage Notes (Signed)
Pt reports upper abdominal pain. States that she was just discharged yesterday from upstairs. Reports that she was dx'd with 3 cysts on her pancreas. Endorses several episodes of vomiting at home.

## 2019-11-02 NOTE — ED Notes (Signed)
Pt returned her stickers to registration.  

## 2019-11-06 ENCOUNTER — Encounter (HOSPITAL_COMMUNITY): Payer: Self-pay

## 2019-11-06 ENCOUNTER — Emergency Department (HOSPITAL_COMMUNITY): Payer: BC Managed Care – PPO

## 2019-11-06 ENCOUNTER — Inpatient Hospital Stay (HOSPITAL_COMMUNITY)
Admission: EM | Admit: 2019-11-06 | Discharge: 2019-11-12 | DRG: 439 | Disposition: A | Discharge status: Home / Self Care | Payer: BC Managed Care – PPO | Attending: Internal Medicine | Admitting: Internal Medicine

## 2019-11-06 ENCOUNTER — Other Ambulatory Visit: Payer: Self-pay

## 2019-11-06 DIAGNOSIS — I5032 Chronic diastolic (congestive) heart failure: Secondary | ICD-10-CM | POA: Diagnosis present

## 2019-11-06 DIAGNOSIS — F1021 Alcohol dependence, in remission: Secondary | ICD-10-CM | POA: Diagnosis present

## 2019-11-06 DIAGNOSIS — Z79899 Other long term (current) drug therapy: Secondary | ICD-10-CM

## 2019-11-06 DIAGNOSIS — F1011 Alcohol abuse, in remission: Secondary | ICD-10-CM | POA: Diagnosis not present

## 2019-11-06 DIAGNOSIS — K8020 Calculus of gallbladder without cholecystitis without obstruction: Secondary | ICD-10-CM | POA: Diagnosis present

## 2019-11-06 DIAGNOSIS — Z20822 Contact with and (suspected) exposure to covid-19: Secondary | ICD-10-CM | POA: Diagnosis present

## 2019-11-06 DIAGNOSIS — K760 Fatty (change of) liver, not elsewhere classified: Secondary | ICD-10-CM | POA: Diagnosis present

## 2019-11-06 DIAGNOSIS — K219 Gastro-esophageal reflux disease without esophagitis: Secondary | ICD-10-CM | POA: Diagnosis present

## 2019-11-06 DIAGNOSIS — Z886 Allergy status to analgesic agent status: Secondary | ICD-10-CM | POA: Diagnosis not present

## 2019-11-06 DIAGNOSIS — Z91010 Allergy to peanuts: Secondary | ICD-10-CM

## 2019-11-06 DIAGNOSIS — I110 Hypertensive heart disease with heart failure: Secondary | ICD-10-CM | POA: Diagnosis present

## 2019-11-06 DIAGNOSIS — J4550 Severe persistent asthma, uncomplicated: Secondary | ICD-10-CM | POA: Diagnosis present

## 2019-11-06 DIAGNOSIS — Z8249 Family history of ischemic heart disease and other diseases of the circulatory system: Secondary | ICD-10-CM | POA: Diagnosis not present

## 2019-11-06 DIAGNOSIS — G629 Polyneuropathy, unspecified: Secondary | ICD-10-CM | POA: Diagnosis present

## 2019-11-06 DIAGNOSIS — Z86718 Personal history of other venous thrombosis and embolism: Secondary | ICD-10-CM | POA: Diagnosis not present

## 2019-11-06 DIAGNOSIS — K860 Alcohol-induced chronic pancreatitis: Secondary | ICD-10-CM | POA: Diagnosis present

## 2019-11-06 DIAGNOSIS — K861 Other chronic pancreatitis: Secondary | ICD-10-CM | POA: Diagnosis not present

## 2019-11-06 DIAGNOSIS — K859 Acute pancreatitis without necrosis or infection, unspecified: Secondary | ICD-10-CM | POA: Diagnosis not present

## 2019-11-06 DIAGNOSIS — E669 Obesity, unspecified: Secondary | ICD-10-CM | POA: Diagnosis present

## 2019-11-06 DIAGNOSIS — F17210 Nicotine dependence, cigarettes, uncomplicated: Secondary | ICD-10-CM | POA: Diagnosis present

## 2019-11-06 DIAGNOSIS — K863 Pseudocyst of pancreas: Secondary | ICD-10-CM | POA: Diagnosis present

## 2019-11-06 DIAGNOSIS — Z9049 Acquired absence of other specified parts of digestive tract: Secondary | ICD-10-CM | POA: Diagnosis not present

## 2019-11-06 DIAGNOSIS — Z833 Family history of diabetes mellitus: Secondary | ICD-10-CM | POA: Diagnosis not present

## 2019-11-06 DIAGNOSIS — Z7951 Long term (current) use of inhaled steroids: Secondary | ICD-10-CM | POA: Diagnosis not present

## 2019-11-06 DIAGNOSIS — K852 Alcohol induced acute pancreatitis without necrosis or infection: Secondary | ICD-10-CM | POA: Diagnosis present

## 2019-11-06 DIAGNOSIS — K862 Cyst of pancreas: Secondary | ICD-10-CM

## 2019-11-06 DIAGNOSIS — Z6834 Body mass index (BMI) 34.0-34.9, adult: Secondary | ICD-10-CM

## 2019-11-06 LAB — COMPREHENSIVE METABOLIC PANEL
ALT: 11 U/L (ref 0–44)
AST: 15 U/L (ref 15–41)
Albumin: 3 g/dL — ABNORMAL LOW (ref 3.5–5.0)
Alkaline Phosphatase: 177 U/L — ABNORMAL HIGH (ref 38–126)
Anion gap: 15 (ref 5–15)
BUN: <5 mg/dL — ABNORMAL LOW (ref 6–20)
CO2: 26 mmol/L (ref 22–32)
Calcium: 9.1 mg/dL (ref 8.9–10.3)
Chloride: 97 mmol/L — ABNORMAL LOW (ref 98–111)
Creatinine, Ser: 0.63 mg/dL (ref 0.44–1.00)
GFR calc Af Amer: >60 mL/min (ref >60)
GFR calc non Af Amer: >60 mL/min (ref >60)
Glucose, Bld: 130 mg/dL — ABNORMAL HIGH (ref 70–99)
Potassium: 3.8 mmol/L (ref 3.5–5.1)
Sodium: 138 mmol/L (ref 135–145)
Total Bilirubin: 1 mg/dL (ref 0.3–1.2)
Total Protein: 7.8 g/dL (ref 6.5–8.1)

## 2019-11-06 LAB — CBC
HCT: 41.1 % (ref 36.0–46.0)
Hemoglobin: 12.6 g/dL (ref 12.0–15.0)
MCH: 27.4 pg (ref 26.0–34.0)
MCHC: 30.7 g/dL (ref 30.0–36.0)
MCV: 89.3 fL (ref 80.0–100.0)
Platelets: 468 10*3/uL — ABNORMAL HIGH (ref 150–400)
RBC: 4.6 MIL/uL (ref 3.87–5.11)
RDW: 13.4 % (ref 11.5–15.5)
WBC: 11.4 10*3/uL — ABNORMAL HIGH (ref 4.0–10.5)
nRBC: 0 % (ref 0.0–0.2)

## 2019-11-06 LAB — LIPASE, BLOOD: Lipase: 89 U/L — ABNORMAL HIGH (ref 11–51)

## 2019-11-06 LAB — SARS CORONAVIRUS 2 BY RT PCR (HOSPITAL ORDER, PERFORMED IN ~~LOC~~ HOSPITAL LAB): SARS Coronavirus 2: NEGATIVE

## 2019-11-06 LAB — I-STAT BETA HCG BLOOD, ED (MC, WL, AP ONLY): I-stat hCG, quantitative: <5 m[IU]/mL (ref <5)

## 2019-11-06 MED ORDER — HYDROMORPHONE HCL 1 MG/ML IJ SOLN
1.0000 mg | Freq: Once | INTRAMUSCULAR | Status: AC
  Administered 2019-11-06: 1 mg via INTRAVENOUS
  Filled 2019-11-06: qty 1

## 2019-11-06 MED ORDER — ONDANSETRON 4 MG PO TBDP
4.0000 mg | ORAL_TABLET | Freq: Once | ORAL | Status: AC | PRN
  Administered 2019-11-06: 4 mg via ORAL
  Filled 2019-11-06: qty 1

## 2019-11-06 MED ORDER — IOHEXOL 300 MG/ML  SOLN
100.0000 mL | Freq: Once | INTRAMUSCULAR | Status: AC | PRN
  Administered 2019-11-06: 100 mL via INTRAVENOUS

## 2019-11-06 MED ORDER — SODIUM CHLORIDE 0.9 % IV BOLUS (SEPSIS)
1000.0000 mL | Freq: Once | INTRAVENOUS | Status: AC
  Administered 2019-11-06: 1000 mL via INTRAVENOUS

## 2019-11-06 MED ORDER — SODIUM CHLORIDE 0.9% FLUSH
3.0000 mL | Freq: Once | INTRAVENOUS | Status: AC
  Administered 2019-11-06: 3 mL via INTRAVENOUS

## 2019-11-06 MED ORDER — ONDANSETRON HCL 4 MG/2ML IJ SOLN
4.0000 mg | Freq: Once | INTRAMUSCULAR | Status: AC
  Administered 2019-11-06: 4 mg via INTRAVENOUS
  Filled 2019-11-06: qty 2

## 2019-11-06 NOTE — H&P (Signed)
History and Physical   Kara [name] OYD:[id number] DOB: 05/30/1985 DOA: 11/06/2019  Referring MD/NP/PA: Dr. Benjiman Core  PCP: [name], MD   Outpatient Specialists: Claudette Head, gastroenterology  Patient coming from: Home  Chief Complaint: Abdominal pain  HPI: Kara [name] is a 34 y.o. 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  Patient was treated with bowel rest pain management and nausea management.  She is to have a follow-up with gastroenterology and discharged.  Patient went home and came back today due to worsening abdominal pain.  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  Her pain is rated as 10 out of 10 epigastric region it is radiating to her back.  Associated with some nausea.  No vomiting.  Patient was seen and evaluated and found to have recurrent pancreatitis.  She will be admitted for treatment and GI evaluation for any further recommendations.  ED Course: Temperature 98.3 blood pressure 142/75 pulse 118 respirate 21 oxygen sat 93% room air.  White count 11.4 hemoglobin 12.6 and platelets 468.  Lactic acid within normal.  CT abdomen pelvis showed diffuse changes of the head of pancreas possible interstitial edema, thrombosis of the left splenic vein with multiple collaterals.  Patient being admitted with pancreatitis with pseudocyst. Review of Systems: As per HPI otherwise 10 point review of systems negative.    Past Medical History:  Diagnosis Date  . Alcoholism in remission (HCC)    per pt in remission since 2017  . Anemia   . AR (allergic rhinitis)    allergist-- dr Lucie Leather  . Biliary dyskinesia   . Diastolic CHF (HCC)    followd by dr Demetrius Charity. [facility] Emms  . Environmental allergies   . GERD (gastroesophageal reflux disease)   . Headache   . History of acute  pancreatitis    alcoholic pancreatitis ,  2016 and 2017  . History of sepsis 06/12/2016   with CAP  . History of syncope   . 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 hematoma due to coumadin treated with IR embolization of the IMA branches  . Hypertension   . Polyneuropathy   . Prolonged QT syndrome    cardiologist-- dr Eden Emms (notes in epic)  . Severe persistent asthma    pulmonologist-- dr dr Belva Crome (notes in epic)    Past Surgical History:  Procedure Laterality Date  . CHOLECYSTECTOMY N/A 06/21/2018   Procedure: LAPAROSCOPIC CHOLECYSTECTOMY;  Surgeon: Abigail Miyamoto, MD;  Location: [facility];  Service: General;  Laterality: N/A;  . ESOPHAGOGASTRODUODENOSCOPY N/A 07/17/2014   Procedure: ESOPHAGOGASTRODUODENOSCOPY (EGD);  Surgeon: Dorena Cookey, MD;  Location: [facility];  Service: Endoscopy;  Laterality: N/A;  . TYMPANOSTOMY TUBE PLACEMENT Bilateral 11 MONTHS OLD  . VIDEO BRONCHOSCOPY Bilateral 10/08/2016   Procedure: VIDEO BRONCHOSCOPY WITH FLUORO;  Surgeon: Chilton Greathouse, MD;  Location: [facility];  Service: Cardiopulmonary;  Laterality: Bilateral;     reports that she has been smoking cigarettes. She has a 2.50 pack-year smoking history. She has never used smokeless tobacco. She reports previous alcohol use. She reports previous drug use.  Allergies  Allergen Reactions  . Nsaids Other (See Comments)    Upset stomach   . Peanuts [Peanut Oil] Hives and Itching    "ONLY TREE NUTS"    Family History  Problem Relation Age  of Onset  . Heart failure Mother   . Heart failure Father   . Diabetes Father   . Heart failure Maternal Grandfather      Prior to Admission medications   Medication Sig Start Date End Date Taking? Authorizing Provider  albuterol (PROVENTIL) (2.5 MG/3ML) 0.083% nebulizer solution Take 3 mLs (2.5 mg total) by nebulization every 4 (four) hours as needed for wheezing or shortness of  breath. 05/03/19   Hetty Blend, FNP  albuterol (VENTOLIN HFA) 108 (90 Base) MCG/ACT inhaler 2 puffs every 4 hours as needed for coughing or wheezing. 06/04/19   Hetty Blend, FNP  BREO ELLIPTA 200-25 MCG/INH AEPB INHALE 1 PUFF INTO THE LUNGS DAILY 05/28/19   Lucie Leather, Alvira Philips, MD  buPROPion [facility] SR) 150 MG 12 hr tablet Take 150-300 mg by mouth See admin instructions. Takes 1 tablet in the morning and 2 tablets at bedtime 04/10/19   [provider]  chlorhexidine (PERIDEX) 0.12 % solution Use as directed 15 mLs in the mouth or throat 2 (two) times daily. After meals Patient taking differently: Use as directed 15 mLs in the mouth or throat daily as needed (dental plaque). After meals  05/03/19   Ambs, Norvel Richards, FNP  Cholecalciferol (VITAMIN D PO) Take 1 tablet by mouth daily.     [provider]  DUPIXENT 300 MG/2ML prefilled syringe INJECT 300 MG SUBCUTANEOUSLY EVERY OTHER WEEK Patient taking differently: Inject 300 mg into the skin every 14 (fourteen) days.  04/09/19   Kozlow, Alvira Philips, MD  EPINEPHrine (EPIPEN 2-PAK) 0.3 mg/0.3 mL IJ SOAJ injection Inject 0.3 mLs (0.3 mg total) into the muscle once as needed (for severe allergic reaction). 07/11/18   Kozlow, Alvira Philips, MD  furosemide (LASIX) 20 MG tablet Take 1 tablet (20 mg total) by mouth daily. Patient taking differently: Take 20 mg by mouth daily as needed for fluid.  06/11/19   Wendall Stade, MD  gabapentin (NEURONTIN) 300 MG capsule TAKE 3 CAPSULES BY MOUTH TWICE DAILY Patient taking differently: Take 900 mg by mouth 2 (two) times daily.  12/27/18   Sater, Pearletha Furl, MD  lipase/protease/amylase 24000-76000 units CPEP Take 1 capsule (24,000 Units total) by mouth 3 (three) times daily before meals. 10/31/19 11/30/19  Lanae Boast, MD  Melatonin 10 MG TABS Take 10 mg by mouth at bedtime.    [provider]  metoCLOPramide (REGLAN) 10 MG tablet Take 1 tablet (10 mg total) by mouth every 8 (eight) hours as needed for up to 12 doses for  nausea. 10/31/19   Lanae Boast, MD  metoprolol succinate (TOPROL-XL) 50 MG 24 hr tablet Take 1 tablet (50 mg total) by mouth daily. 06/11/19   Wendall Stade, MD  montelukast (SINGULAIR) 10 MG tablet TAKE 1 TABLET(10 MG) BY MOUTH AT BEDTIME 10/23/19   Ambs, Norvel Richards, FNP  Multiple Vitamins-Minerals (MULTIVITAMIN WITH MINERALS) tablet Take 1 tablet by mouth daily.    [provider]  omeprazole (PRILOSEC) 40 MG capsule TAKE 1 CAPSULE(40 MG) BY MOUTH DAILY Patient taking differently: Take 40 mg by mouth daily.  05/28/19   Hetty Blend, FNP  QUEtiapine (SEROQUEL) 100 MG tablet Take 100 mg by mouth at bedtime. 03/23/19   [provider]  rosuvastatin (CRESTOR) 10 MG tablet Take 10 mg by mouth daily.    [provider]  sertraline (ZOLOFT) 50 MG tablet Take 75 mg by mouth daily.    [provider]  thiamine 100 MG tablet Take 100  mg by mouth daily.    [provider]  umeclidinium bromide (INCRUSE ELLIPTA) 62.5 MCG/INH AEPB Inhale 1 puff into the lungs daily. 05/03/19   Hetty Blend, FNP    Physical Exam: Vitals:   11/06/19 2052 11/06/19 2106 11/06/19 2130 11/06/19 2328  BP: 120/88 114/75  (!) 141/75  Pulse: (!) 107 (!) 104 (!) 103 (!) 104  Resp: 17 15 (!) 21 20  Temp:      TempSrc:      SpO2: 99% 100% 100% 93%  Weight:      Height:          Constitutional: NAD, calm, comfortable Vitals:   11/06/19 2052 11/06/19 2106 11/06/19 2130 11/06/19 2328  BP: 120/88 114/75  (!) 141/75  Pulse: (!) 107 (!) 104 (!) 103 (!) 104  Resp: 17 15 (!) 21 20  Temp:      TempSrc:      SpO2: 99% 100% 100% 93%  Weight:      Height:       Eyes: PERRL, lids and conjunctivae normal ENMT: Mucous membranes are moist. Posterior pharynx clear of any exudate or lesions.Normal dentition.  Neck: normal, supple, no masses, no thyromegaly Respiratory: clear to auscultation bilaterally, no wheezing, no crackles. Normal respiratory effort. No accessory muscle use.  Cardiovascular:  Regular rate and rhythm, no murmurs / rubs / gallops. No extremity edema. 2+ pedal pulses. No carotid bruits.  Abdomen: Distended, diffusely tender more in the epigastric region.:, no masses palpated. No hepatosplenomegaly. Bowel sounds positive.  Musculoskeletal: no clubbing / cyanosis. No joint deformity upper and lower extremities. Good ROM, no contractures. Normal muscle tone.  Skin: no rashes, lesions, ulcers. No induration Neurologic: CN 2-12 grossly intact. Sensation intact, DTR normal. Strength 5/5 in all 4.  Psychiatric: Normal judgment and insight. Alert and oriented x 3. Normal mood.     Labs on Admission: I have personally reviewed following labs and imaging studies  CBC: Recent Labs  Lab 11/01/19 1947 11/06/19 1205  WBC 9.0 11.4*  HGB 11.1* 12.6  HCT 35.7* 41.1  MCV 86.9 89.3  PLT 546* 468*   Basic Metabolic Panel: Recent Labs  Lab 11/01/19 1947 11/06/19 1205  NA 138 138  K 3.8 3.8  CL 99 97*  CO2 24 26  GLUCOSE 107* 130*  BUN 5* <5*  CREATININE 0.55 0.63  CALCIUM 8.9 9.1   GFR: Estimated Creatinine Clearance: 111.3 mL/min (by C-G formula based on SCr of 0.63 mg/dL). Liver Function Tests: Recent Labs  Lab 11/01/19 1947 11/06/19 1205  AST 15 15  ALT 9 11  ALKPHOS 210* 177*  BILITOT 0.4 1.0  PROT 7.4 7.8  ALBUMIN 2.8* 3.0*   Recent Labs  Lab 11/01/19 1947 11/06/19 1205  LIPASE 139* 89*   No results for input(s): AMMONIA in the last 168 hours. Coagulation Profile: No results for input(s): INR, PROTIME in the last 168 hours. Cardiac Enzymes: No results for input(s): CKTOTAL, CKMB, CKMBINDEX, TROPONINI in the last 168 hours. BNP (last 3 results) No results for input(s): PROBNP in the last 8760 hours. HbA1C: No results for input(s): HGBA1C in the last 72 hours. CBG: Recent Labs  Lab 10/31/19 0346 10/31/19 0724  GLUCAP 96 100*   Lipid Profile: No results for input(s): CHOL, HDL, LDLCALC, TRIG, CHOLHDL, LDLDIRECT in the last 72  hours. Thyroid Function Tests: No results for input(s): TSH, T4TOTAL, FREET4, T3FREE, THYROIDAB in the last 72 hours. Anemia Panel: No results for input(s): VITAMINB12, FOLATE, FERRITIN, TIBC, IRON,  RETICCTPCT in the last 72 hours. Urine analysis:    Component Value Date/Time   COLORURINE YELLOW 10/17/2019 0628   APPEARANCEUR CLOUDY (A) 10/17/2019 0628   LABSPEC <1.005 (L) 10/17/2019 0628   PHURINE 6.5 10/17/2019 0628   GLUCOSEU NEGATIVE 10/17/2019 0628   HGBUR TRACE (A) 10/17/2019 0628   BILIRUBINUR SMALL (A) 10/17/2019 0628   KETONESUR NEGATIVE 10/17/2019 0628   PROTEINUR 100 (A) 10/17/2019 0628   UROBILINOGEN 1.0 02/16/2015 1231   NITRITE NEGATIVE 10/17/2019 0628   LEUKOCYTESUR NEGATIVE 10/17/2019 0628   Sepsis Labs: @LABRCNTIP (procalcitonin:4,lacticidven:4) ) Recent Results (from the past 240 hour(s))  SARS Coronavirus 2 by RT PCR (hospital order, [facility]) Nasopharyngeal Nasopharyngeal Swab     Status: None   Collection Time: 11/06/19  9:46 PM   Specimen: Nasopharyngeal Swab  Result Value Ref Range Status   SARS Coronavirus 2 NEGATIVE NEGATIVE Final    Comment: (NOTE) SARS-CoV-2 target nucleic acids are NOT DETECTED.  The SARS-CoV-2 RNA is generally detectable in upper and lower respiratory specimens during the acute phase of infection. The lowest concentration of SARS-CoV-2 viral copies this assay can detect is 250 copies / mL. A negative result does not preclude SARS-CoV-2 infection and should not be used as the sole basis for treatment or other patient management decisions.  A negative result may occur with improper specimen collection / handling, submission of specimen other than nasopharyngeal swab, presence of viral mutation(s) within the areas targeted by this assay, and inadequate number of viral copies (<250 copies / mL). A negative result must be combined with clinical observations, patient history, and epidemiological  information.  Fact Sheet for Patients:   BoilerBrush.com.cyhttps://www.fda.gov/media/136312/download  Fact Sheet for Healthcare Providers: https://pope.com/https://www.fda.gov/media/136313/download  This test is not yet approved or  cleared by the [place] FDA and has been authorized for detection and/or diagnosis of SARS-CoV-2 by FDA under an Emergency Use Authorization (EUA).  This EUA will remain in effect (meaning this test can be used) for the duration of the COVID-19 declaration under Section 564(b)(1) of the Act, 21 U.S.C. section 360bbb-3(b)(1), unless the authorization is terminated or revoked sooner.  Performed at [facility], [address] [place], [place] [address]      Radiological Exams on Admission: CT ABDOMEN PELVIS W CONTRAST  Result Date: 11/06/2019 CLINICAL DATA:  Acute pancreatitis, abdominal pain, syncope EXAM: CT ABDOMEN AND PELVIS WITH CONTRAST TECHNIQUE: Multidetector CT imaging of the abdomen and pelvis was performed using the standard protocol following bolus administration of intravenous contrast. CONTRAST:  100mL OMNIPAQUE IOHEXOL 300 MG/ML  SOLN COMPARISON:  10/25/2019 FINDINGS: Lower chest: Small left pleural effusion and associated left basilar atelectasis is again identified. The visualized heart and pericardium are unremarkable. Hepatobiliary: Cholecystectomy has been performed. Moderate hepatomegaly is again noted and appears stable. No intra or extrahepatic biliary ductal dilation. Pancreas: Peripancreatic inflammatory changes extending into the greater omentum have improved in the interval since the prior examination. Intrapancreatic pseudocyst, axial image # 30/2 has increased slightly, measuring 1.5 x 2.0 cm. This courses superiorly along the greater curvature of the stomach toward the fundus, better noted on coronal images number 76-81. There is slightly heterogeneous enhancement of the pancreatic parenchyma within the pancreatic head, however, possibly related  to interstitial edema, however, no areas of frank parenchymal necrosis are identified. The pancreatic duct is not dilated. No parenchymal calcifications are seen. A small amount of phlegmon is again seen adjacent to the fundus of the stomach at the gastroesophageal junction,  similar to that noted on prior examination there is a a subdiaphragmatic phlegmon again identified, measuring 2.5 x 3.1 cm at axial image # 11/2 grossly unchanged from prior examination. Spleen: The spleen is mildly enlarged, unchanged. 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 Adrenals/Urinary Tract: Adrenal glands and kidneys are unremarkable. Bladder is decompressed Stomach/Bowel: Large and small bowel are unremarkable. Appendix normal. No free intraperitoneal gas or fluid. Stable loculated mesenteric fluid collection within the left mid abdomen adjacent to several surgical clips possibly representing a postoperative seroma or chronic hematoma. This measures 2.4 x 4.0 cm at axial image # 54/2. Vascular/Lymphatic: No pathologic adenopathy within the abdomen and pelvis. Aside from gastroepiploic collateral secondary to thrombosis of the splenic vein, the abdominal vasculature is unremarkable. In particular, the superior mesenteric vein and portal vein are patent. Reproductive: Unremarkable Other: The rectum is unremarkable Musculoskeletal: No acute bone abnormality. IMPRESSION: Improving peripancreatic inflammatory changes. Heterogeneous enhancement within the head of the pancreas likely relates to interstitial edema without frank parenchymal necrosis clearly identified. Inflammatory collections are noted within the left upper quadrant the abdomen within the subdiaphragmatic region and perigastric regions adjacent to the gastric fundus at the diaphragmatic hiatus and along the greater curvature extending into the mid  body of the pancreas. These appear relatively stable since prior examination. Thrombosis of the splenic vein with numerous gastroepiploic collaterals, unchanged. Stable moderate hepatomegaly.  Stable mild splenomegaly. Electronically Signed   By: Helyn Numbers MD   On: 11/06/2019 21:09    Assessment/Plan Principal Problem:   Acute on chronic pancreatitis (HCC) Active Problems:   History of alcohol abuse   Pancreatitis   Gastroesophageal reflux disease   Pancreatic pseudocyst/cyst     #1 acute on chronic pancreatitis: Secondary to alcohol abuse.  Patient may need to have her pseudocyst removed.  Aspiration by IR also.  She is very tender in the abdomen.  She will avoid.  Complete bowel rest, IV Protonix and supportive care.  #2 alcohol abuse history: Patient denied any recent alcohol intake.  Will watch patient closely.  #3 CHF: Appears compensated.  Continue monitor   #4 GERD: Continue with PPIs      DVT prophylaxis: Lovenox Code Status: Full code Family Communication: No family at bedside Disposition Plan: Home Consults called: Dr. Evette Cristal, gastroenterologist Admission status: Inpatient  Severity of Illness: The appropriate patient status for this patient is INPATIENT. Inpatient status is judged to be reasonable and necessary in order to provide the required intensity of service to ensure the patient's safety. The patient's presenting symptoms, physical exam findings, and initial radiographic and laboratory data in the context of their chronic comorbidities is felt to place them at high risk for further clinical deterioration. Furthermore, it is not anticipated that the patient will be medically stable for discharge from the hospital within 2 midnights of admission. The following factors support the patient status of inpatient.   " The patient's presenting symptoms include abdominal pain nausea with vomiting. " The worrisome physical exam findings include significant epigastric  tenderness. " The initial radiographic and laboratory data are worrisome because of evidence of pancreatic pseudocyst. " The chronic co-morbidities include 205 0298.   * I certify that at the point of admission it is my clinical judgment that the patient will require inpatient hospital care spanning beyond 2 midnights from the point of admission due to high intensity of service, high risk for  further deterioration and high frequency of surveillance required.Lonia Blood MD [facility] Pager [phone]  If 7PM-7AM, please contact night-coverage [email or web address] Password [facility]  11/07/2019, 12:31 AM

## 2019-11-06 NOTE — ED Triage Notes (Signed)
Pt arrived via EMS from home CC abd pain X 10 and syncope episode today. Pt denies falls. Per EMS a/OX4 VSS, BP 160 palpated, assist to stand, ambulatory at baseline.    Hx HTN, abd cyst, gallbladder disease

## 2019-11-06 NOTE — ED Notes (Signed)
Pt aware urine sample needed 

## 2019-11-06 NOTE — ED Provider Notes (Signed)
[facility] DEPT Provider Note   CSN: [id number] Arrival date & time: 11/06/19  1101     History No chief complaint on file.   Kara [name] is a 34 y.o. female.  Patient is a 34 y/o F with PMH Recurrent pancreatitis now with multiple pancreatic, peripancreatic fluid collections, forming pseudocysts. Alcohol felt to be the etiology but she reports she has been abstinent for 3 years.  Recent admission for the same for which [facility] had been following her.  She was discharged less than 1 week ago and advised to follow-up with GI in the office with plan to drain the cyst if no improvement.. Lap chole in March 2020.  She reports 2 days after her discharge she began to have recurrent pain, nausea, vomiting and inability to keep down any liquids or solids.  Reports that she has been following her discharge instructions thoroughly without any relief.  Denies any fever        Past Medical History:  Diagnosis Date  . Alcoholism in remission ([place])    per pt in remission since 2017  . Anemia   . AR (allergic rhinitis)    allergist-- dr Neldon Mc  . Biliary dyskinesia   . Diastolic CHF ([place])    followd by dr Mamie Nick. Johnsie Cancel  . Environmental allergies   . GERD (gastroesophageal reflux disease)   . Headache   . History of acute pancreatitis    alcoholic pancreatitis ,  5176 and 2017  . History of sepsis 06/12/2016   with CAP  . History of syncope   . 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 hematoma due to coumadin treated with IR embolization of the IMA branches  . Hypertension   . Polyneuropathy   . Prolonged QT syndrome    cardiologist-- dr Johnsie Cancel (notes in epic)  . Severe persistent asthma    pulmonologist-- dr dr Bernita Raisin (notes in epic)    Patient Active Problem List   Diagnosis Date Noted  . Elevated alkaline phosphatase level   . Malnutrition of moderate degree 10/23/2019  .  Pancreatic pseudocyst   . Pancreatic pseudocyst/cyst   . Fatty liver   . Other allergic rhinitis 05/03/2019  . Food allergy 05/03/2019  . Tobacco smoker, less than 10 cigarettes per day 05/03/2019  . Asthma, severe persistent, well-controlled 11/11/2017  . Lung infiltrate   . Hyponatremia 08/27/2016  . Hyperglycemia 08/27/2016  . Acute respiratory failure with hypoxia ([place]) 08/26/2016  . HCAP (healthcare-associated pneumonia) 07/31/2016  . Tobacco abuse 07/31/2016  . Respiratory failure with hypoxia ([place]) 07/31/2016  . CAP (community acquired pneumonia) 06/13/2016  . Asthma exacerbation 06/12/2016  . Alcohol abuse, in remission 06/12/2016  . Sepsis ([place]) 06/12/2016  . Pancreatitis 09/09/2015  . Gastroesophageal reflux disease 09/09/2015  . Benign essential HTN 09/09/2015  . FUO (fever of unknown origin)   . Abdominal hematoma 02/10/2015  . Warfarin-induced coagulopathy ([place]) 02/10/2015  . Hypertension 02/10/2015  . Hypoalbuminemia due to protein-calorie malnutrition ([place]) 02/10/2015  . Alcoholic pancreatitis 16/10/3708  . Acute blood loss anemia   . Intra-abdominal hematoma 02/09/2015  . Pancreatitis, acute   . 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 Acute pancreatitis 01/29/2015  . Sinus tachycardia 01/29/2015  . Leukocytosis 01/29/2015  . AKI (acute kidney injury) ([place]) 01/29/2015  . Insomnia 10/29/2014  . Polyneuropathy 09/02/2014  . History of alcohol abuse 09/02/2014  . Edema 09/02/2014  . Neck pain 09/02/2014  . Chronic  lower back pain 09/02/2014  . Numbness of foot 08/02/2014  . Hand numbness 08/02/2014  . Ataxic gait 08/02/2014  . Proximal leg weakness 08/02/2014  . Constipation   . Abdominal pain 07/16/2014  . Hypothermia 07/16/2014  . Upper abdominal pain   . Abdominal pain, epigastric 07/06/2014  . Obesity (BMI 30-39.9) 07/06/2014  . Chronic diastolic heart failure ([place]) 07/06/2014  . Syncope 04/08/2014  . Prolonged Q-T interval on ECG 04/08/2014  .  Hypokalemia 04/08/2014  . Elevated LFTs 04/08/2014    Past Surgical History:  Procedure Laterality Date  . CHOLECYSTECTOMY N/A 06/21/2018   Procedure: LAPAROSCOPIC CHOLECYSTECTOMY;  Surgeon: Coralie Keens, MD;  Location: [facility];  Service: General;  Laterality: N/A;  . ESOPHAGOGASTRODUODENOSCOPY N/A 07/17/2014   Procedure: ESOPHAGOGASTRODUODENOSCOPY (EGD);  Surgeon: Teena Irani, MD;  Location: [facility];  Service: Endoscopy;  Laterality: N/A;  . TYMPANOSTOMY TUBE PLACEMENT Bilateral 11 MONTHS OLD  . VIDEO BRONCHOSCOPY Bilateral 10/08/2016   Procedure: VIDEO BRONCHOSCOPY WITH FLUORO;  Surgeon: Marshell Garfinkel, MD;  Location: [facility];  Service: Cardiopulmonary;  Laterality: Bilateral;     OB History   No obstetric history on file.     Family History  Problem Relation Age of Onset  . Heart failure Mother   . Heart failure Father   . Diabetes Father   . Heart failure Maternal Grandfather     Social History   Tobacco Use  . Smoking status: Current Every Day Smoker    Packs/day: 0.50    Years: 5.00    Pack years: 2.50    Types: Cigarettes  . Smokeless tobacco: Never Used  Vaping Use  . Vaping Use: Never used  Substance Use Topics  . Alcohol use: Not Currently    Alcohol/week: 0.0 standard drinks    Comment: pt hx alcohol abuse , in remission since 2017  . 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 Authorizing Provider  albuterol (PROVENTIL) (2.5 MG/3ML) 0.083% nebulizer solution Take 3 mLs (2.5 mg total) by nebulization every 4 (four) hours as needed for wheezing or shortness of breath. 05/03/19   Dara Hoyer, FNP  albuterol (VENTOLIN HFA) 108 (90 Base) MCG/ACT inhaler 2 puffs every 4 hours as needed for coughing or wheezing. 06/04/19   Dara Hoyer, FNP  BREO ELLIPTA 200-25 MCG/INH AEPB INHALE 1 PUFF INTO THE LUNGS DAILY 05/28/19   Neldon Mc, Donnamarie Poag,  MD  buPROPion [facility] SR) 150 MG 12 hr tablet Take 150-300 mg by mouth See admin instructions. Takes 1 tablet in the morning and 2 tablets at bedtime 04/10/19   [provider]  chlorhexidine (PERIDEX) 0.12 % solution Use as directed 15 mLs in the mouth or throat 2 (two) times daily. After meals Patient taking differently: Use as directed 15 mLs in the mouth or throat daily as needed (dental plaque). After meals  05/03/19   Ambs, Kathrine Cords, FNP  Cholecalciferol (VITAMIN D PO) Take 1 tablet by mouth daily.     [provider]  DUPIXENT 300 MG/2ML prefilled syringe INJECT 300 MG [place] Patient taking differently: Inject 300 mg into the skin every 14 (fourteen) days.  04/09/19   Kozlow, Donnamarie Poag, MD  EPINEPHrine (EPIPEN 2-PAK) 0.3 mg/0.3 mL IJ SOAJ injection Inject 0.3 mLs (0.3 mg total) into the muscle once as needed (for severe allergic reaction). 07/11/18  Kozlow, Donnamarie Poag, MD  furosemide (LASIX) 20 MG tablet Take 1 tablet (20 mg total) by mouth daily. Patient taking differently: Take 20 mg by mouth daily as needed for fluid.  06/11/19   Josue Hector, MD  gabapentin (NEURONTIN) 300 MG capsule TAKE 3 CAPSULES BY MOUTH TWICE DAILY Patient taking differently: Take 900 mg by mouth 2 (two) times daily.  12/27/18   Sater, Nanine Means, MD  lipase/protease/amylase 24000-76000 units CPEP Take 1 capsule (24,000 Units total) by mouth 3 (three) times daily before meals. 10/31/19 11/30/19  Antonieta Pert, MD  Melatonin 10 MG TABS Take 10 mg by mouth at bedtime.    [provider]  metoCLOPramide (REGLAN) 10 MG tablet Take 1 tablet (10 mg total) by mouth every 8 (eight) hours as needed for up to 12 doses for nausea. 10/31/19   Antonieta Pert, MD  metoprolol succinate (TOPROL-XL) 50 MG 24 hr tablet Take 1 tablet (50 mg total) by mouth daily. 06/11/19   Josue Hector, MD  montelukast (SINGULAIR) 10 MG tablet TAKE 1 TABLET(10 MG) BY MOUTH AT BEDTIME 10/23/19   Ambs, Kathrine Cords, FNP    Multiple Vitamins-Minerals (MULTIVITAMIN WITH MINERALS) tablet Take 1 tablet by mouth daily.    [provider]  omeprazole (PRILOSEC) 40 MG capsule TAKE 1 CAPSULE(40 MG) BY MOUTH DAILY Patient taking differently: Take 40 mg by mouth daily.  05/28/19   Dara Hoyer, FNP  QUEtiapine (SEROQUEL) 100 MG tablet Take 100 mg by mouth at bedtime. 03/23/19   [provider]  rosuvastatin (CRESTOR) 10 MG tablet Take 10 mg by mouth daily.    [provider]  sertraline (ZOLOFT) 50 MG tablet Take 75 mg by mouth daily.    [provider]  thiamine 100 MG tablet Take 100 mg by mouth daily.    [provider]  umeclidinium bromide (INCRUSE ELLIPTA) 62.5 MCG/INH AEPB Inhale 1 puff into the lungs daily. 05/03/19   Dara Hoyer, FNP    Allergies    Nsaids and Peanuts [peanut oil]  Review of Systems   Review of Systems  Constitutional: Positive for unexpected weight change. Negative for appetite change, chills and fever.  HENT: Negative.   Respiratory: Negative for cough and shortness of breath.   Cardiovascular: Negative for chest pain.  Gastrointestinal: Positive for abdominal pain, nausea and vomiting. Negative for blood in stool, constipation and diarrhea.  Genitourinary: Negative for dysuria.  Musculoskeletal: Negative for back pain.  Skin: Negative for rash.  Neurological: Negative for dizziness, light-headedness and headaches.    Physical Exam Updated Vital Signs BP 114/75   Pulse (!) 103   Temp 98.3 F (36.8 C) (Oral)   Resp (!) 21   Ht '5\' 5"'$  (1.651 m)   Wt 94 kg   LMP 10/07/2019 (Approximate) Comment: Pt signed preg test waiver   SpO2 100%   BMI 34.47 kg/m   Physical Exam Vitals and nursing note reviewed.  Constitutional:      General: She is not in acute distress.    Appearance: Normal appearance. She is not ill-appearing, toxic-appearing or diaphoretic.     Comments: Appears uncomfortable, tearful, in pain  HENT:     Head:  Normocephalic.  Eyes:     Conjunctiva/sclera: Conjunctivae normal.  Pulmonary:     Effort: Pulmonary effort is normal.  Skin:    General: Skin is dry.  Neurological:     Mental Status: She is alert.  Psychiatric:  Mood and Affect: Mood is anxious. Affect is tearful.     ED Results / Procedures / Treatments   Labs (all labs ordered are listed, but only abnormal results are displayed) Labs Reviewed  LIPASE, BLOOD - Abnormal; Notable for the following components:      Result Value   Lipase 89 (*)    All other components within normal limits  COMPREHENSIVE METABOLIC PANEL - Abnormal; Notable for the following components:   Chloride 97 (*)    Glucose, Bld 130 (*)    BUN <5 (*)    Albumin 3.0 (*)    Alkaline Phosphatase 177 (*)    All other components within normal limits  CBC - Abnormal; Notable for the following components:   WBC 11.4 (*)    Platelets 468 (*)    All other components within normal limits  SARS CORONAVIRUS 2 BY RT PCR (HOSPITAL ORDER, [facility])  URINALYSIS, ROUTINE W REFLEX MICROSCOPIC  I-STAT BETA HCG BLOOD, ED ([facility], [facility], [facility] ONLY)    EKG None  Radiology CT ABDOMEN PELVIS W CONTRAST  Result Date: 11/06/2019 CLINICAL DATA:  Acute pancreatitis, abdominal pain, syncope EXAM: CT ABDOMEN AND PELVIS WITH CONTRAST TECHNIQUE: Multidetector CT imaging of the abdomen and pelvis was performed using the standard protocol following bolus administration of intravenous contrast. CONTRAST:  120m OMNIPAQUE IOHEXOL 300 MG/ML  SOLN COMPARISON:  10/25/2019 FINDINGS: Lower chest: Small left pleural effusion and associated left basilar atelectasis is again identified. The visualized heart and pericardium are unremarkable. Hepatobiliary: Cholecystectomy has been performed. Moderate hepatomegaly is again noted and appears stable. No intra or extrahepatic biliary ductal dilation. Pancreas: Peripancreatic inflammatory changes extending into the greater  omentum have improved in the interval since the prior examination. Intrapancreatic pseudocyst, axial image # 30/2 has increased slightly, measuring 1.5 x 2.0 cm. This courses superiorly along the greater curvature of the stomach toward the fundus, better noted on coronal images number 76-81. There is slightly heterogeneous enhancement of the pancreatic parenchyma within the pancreatic head, however, possibly related to interstitial edema, however, no areas of frank parenchymal necrosis are identified. The pancreatic duct is not dilated. No parenchymal calcifications are seen. A small amount of phlegmon is again seen adjacent to the fundus of the stomach at the gastroesophageal junction, similar to that noted on prior examination there is a a subdiaphragmatic phlegmon again identified, measuring 2.5 x 3.1 cm at axial image # 11/2 grossly unchanged from prior examination. Spleen: The spleen is mildly enlarged, unchanged. 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 Adrenals/Urinary Tract: Adrenal glands and kidneys are unremarkable. Bladder is decompressed Stomach/Bowel: Large and small bowel are unremarkable. Appendix normal. No free intraperitoneal gas or fluid. Stable loculated mesenteric fluid collection within the left mid abdomen adjacent to several surgical clips possibly representing a postoperative seroma or chronic hematoma. This measures 2.4 x 4.0 cm at axial image # 54/2. Vascular/Lymphatic: No pathologic adenopathy within the abdomen and pelvis. Aside from gastroepiploic collateral secondary to thrombosis of the splenic vein, the abdominal vasculature is unremarkable. In particular, the superior mesenteric vein and portal vein are patent. Reproductive: Unremarkable Other: The rectum is unremarkable Musculoskeletal: No acute bone abnormality. IMPRESSION: Improving peripancreatic  inflammatory changes. Heterogeneous enhancement within the head of the pancreas likely relates to interstitial edema without frank parenchymal necrosis clearly identified. Inflammatory collections are noted within the left upper quadrant the abdomen within  the subdiaphragmatic region and perigastric regions adjacent to the gastric fundus at the diaphragmatic hiatus and along the greater curvature extending into the mid body of the pancreas. These appear relatively stable since prior examination. Thrombosis of the splenic vein with numerous gastroepiploic collaterals, unchanged. Stable moderate hepatomegaly.  Stable mild splenomegaly. Electronically Signed   By: Fidela Salisbury MD   On: 11/06/2019 21:09    Procedures Procedures (including critical care time)  Medications Ordered in ED Medications  HYDROmorphone (DILAUDID) injection 1 mg (has no administration in time range)  sodium chloride flush (NS) 0.9 % injection 3 mL (3 mLs Intravenous Given 11/06/19 2031)  ondansetron (ZOFRAN-ODT) disintegrating tablet 4 mg (4 mg Oral Given 11/06/19 1128)  HYDROmorphone (DILAUDID) injection 1 mg (1 mg Intravenous Given 11/06/19 2031)  sodium chloride 0.9 % bolus 1,000 mL (1,000 mLs Intravenous New Bag/Given 11/06/19 2032)  ondansetron (ZOFRAN) injection 4 mg (4 mg Intravenous Given 11/06/19 2030)  iohexol (OMNIPAQUE) 300 MG/ML solution 100 mL (100 mLs Intravenous Contrast Given 11/06/19 2046)    ED Course  I have reviewed the triage vital signs and the nursing notes.  Pertinent labs & imaging results that were available during my care of the patient were reviewed by me and considered in my medical decision making (see chart for details).  Clinical Course as of Nov 06 2146  Tue Nov 06, 2019  2119 Patient has history of chronic pancreatitis with recent admission with serial cysts of the pancreas presenting to the emergency department for worsening symptoms and unable to tolerate p.o. since being discharged last  Wednesday.  On exam she is afebrile but tachycardic and appears in pain.  Her lipase is mildly elevated to 89 and her white count is 11.4.  Her alk phos is elevated to 177.  Her CT scan reflects that the inflammatory changes of the pancreas are actually decreasing.  However, her pseudocyst is very slightly increasing.  Given these findings and her symptoms will consult with GI and likely admit to medicine   [KM]  2120 She has stable inflammatory changes in the left upper quadrant.  There is no frank pancreatic necrosis seen on the CT scan.  Patient reports that she is feeling somewhat better but still not able to tolerate p.o.  We will discuss this with GI.  Likely will need admission with GI consult again for possible draining of the cyst if she is no better.   [KM]  2132 Spoke with GI, Dr. Loletha Carrow who advised they will see patient in the morning. Plant to admit to medicine. Dr. Jeannie Fend to admit    [KM]    Clinical Course User Index [KM] Kristine Royal   MDM Rules/Calculators/A&P                          The patient appears reasonably stabilized for admission considering the current resources, flow, and capabilities available in the ED at this time, and I doubt any other [facility] requiring further screening and/or treatment in the ED prior to admission.  Final Clinical Impression(s) / ED Diagnoses Final diagnoses:  Acute pancreatitis, unspecified complication status, unspecified pancreatitis type  Pancreatic cyst    Rx / DC Orders ED Discharge Orders    None       Kristine Royal 11/06/19 2148    Davonna Belling, MD 11/07/19 [phone]

## 2019-11-07 DIAGNOSIS — K859 Acute pancreatitis without necrosis or infection, unspecified: Secondary | ICD-10-CM

## 2019-11-07 DIAGNOSIS — K862 Cyst of pancreas: Secondary | ICD-10-CM

## 2019-11-07 DIAGNOSIS — K861 Other chronic pancreatitis: Secondary | ICD-10-CM

## 2019-11-07 DIAGNOSIS — K219 Gastro-esophageal reflux disease without esophagitis: Secondary | ICD-10-CM

## 2019-11-07 LAB — CREATININE, SERUM
Creatinine, Ser: 0.6 mg/dL (ref 0.44–1.00)
GFR calc Af Amer: >60 mL/min (ref >60)
GFR calc non Af Amer: >60 mL/min (ref >60)

## 2019-11-07 LAB — CBC
HCT: 34.5 % — ABNORMAL LOW (ref 36.0–46.0)
Hemoglobin: 10.3 g/dL — ABNORMAL LOW (ref 12.0–15.0)
MCH: 27.2 pg (ref 26.0–34.0)
MCHC: 29.9 g/dL — ABNORMAL LOW (ref 30.0–36.0)
MCV: 91.3 fL (ref 80.0–100.0)
Platelets: 316 10*3/uL (ref 150–400)
RBC: 3.78 MIL/uL — ABNORMAL LOW (ref 3.87–5.11)
RDW: 13.3 % (ref 11.5–15.5)
WBC: 7 10*3/uL (ref 4.0–10.5)
nRBC: 0 % (ref 0.0–0.2)

## 2019-11-07 MED ORDER — HYDROMORPHONE HCL 1 MG/ML IJ SOLN
1.0000 mg | INTRAMUSCULAR | Status: DC | PRN
  Administered 2019-11-07 (×3): 1 mg via INTRAVENOUS
  Filled 2019-11-07 (×3): qty 1

## 2019-11-07 MED ORDER — ENOXAPARIN SODIUM 40 MG/0.4ML ~~LOC~~ SOLN
40.0000 mg | SUBCUTANEOUS | Status: DC
  Administered 2019-11-07 – 2019-11-12 (×6): 40 mg via SUBCUTANEOUS
  Filled 2019-11-07 (×6): qty 0.4

## 2019-11-07 MED ORDER — ADULT MULTIVITAMIN W/MINERALS CH
1.0000 | ORAL_TABLET | Freq: Every day | ORAL | Status: DC
  Administered 2019-11-07 – 2019-11-12 (×6): 1 via ORAL
  Filled 2019-11-07 (×6): qty 1

## 2019-11-07 MED ORDER — ONDANSETRON HCL 4 MG/2ML IJ SOLN: 4.0000 mg | Freq: Four times a day (QID) | INTRAMUSCULAR | Status: DC | PRN

## 2019-11-07 MED ORDER — ONDANSETRON HCL 4 MG PO TABS
4.0000 mg | ORAL_TABLET | Freq: Four times a day (QID) | ORAL | Status: DC | PRN
  Administered 2019-11-09 – 2019-11-11 (×2): 4 mg via ORAL
  Filled 2019-11-07 (×2): qty 1

## 2019-11-07 MED ORDER — PROSOURCE PLUS PO LIQD
30.0000 mL | Freq: Three times a day (TID) | ORAL | Status: DC
  Administered 2019-11-07 – 2019-11-12 (×12): 30 mL via ORAL
  Filled 2019-11-07 (×16): qty 30

## 2019-11-07 MED ORDER — HYDROMORPHONE HCL 1 MG/ML IJ SOLN
1.0000 mg | INTRAMUSCULAR | Status: DC | PRN
  Administered 2019-11-07 – 2019-11-10 (×18): 1 mg via INTRAVENOUS
  Filled 2019-11-07 (×18): qty 1

## 2019-11-07 MED ORDER — HYDROXYZINE HCL 10 MG PO TABS
10.0000 mg | ORAL_TABLET | Freq: Three times a day (TID) | ORAL | Status: DC | PRN
  Administered 2019-11-07: 10 mg via ORAL
  Filled 2019-11-07 (×2): qty 1

## 2019-11-07 MED ORDER — BOOST / RESOURCE BREEZE PO LIQD CUSTOM
1.0000 | Freq: Three times a day (TID) | ORAL | Status: DC
  Administered 2019-11-07 – 2019-11-11 (×12): 1 via ORAL
  Filled 2019-11-07: qty 1

## 2019-11-07 MED ORDER — OXYCODONE HCL 5 MG PO TABS
5.0000 mg | ORAL_TABLET | ORAL | Status: DC | PRN
  Administered 2019-11-07 – 2019-11-08 (×3): 5 mg via ORAL
  Filled 2019-11-07 (×3): qty 1

## 2019-11-07 MED ORDER — DEXTROSE IN LACTATED RINGERS 5 % IV SOLN
INTRAVENOUS | Status: DC
  Filled 2019-11-07 (×2): qty 1000

## 2019-11-07 NOTE — Progress Notes (Signed)
Initial Nutrition Assessment  DOCUMENTATION CODES:   Non-severe (moderate) malnutrition in context of acute illness/injury, Obesity unspecified  INTERVENTION:  - continue Boost Breeze TID, each supplement provides 250 kcal and 9 grams of protein. - will order 30 mL Prosource Plus TID, each supplement provides 100 kcal and 15 grams of protein. - will order 1 tablet multivitamin with minerals/day. - recommend diet advancement as medically feasible. - will monitor for need for nutrition support.   NUTRITION DIAGNOSIS:   Moderate Malnutrition related to acute illness (persistent pancreatitis) as evidenced by energy intake < 75% for > or equal to 1 month, mild fat depletion, mild muscle depletion.  GOAL:   Patient will meet greater than or equal to 90% of their needs  MONITOR:   PO intake, Supplement acceptance, Diet advancement, Labs, Weight trends  REASON FOR ASSESSMENT:   Malnutrition Screening Tool  ASSESSMENT:   34 year old female with medical history of recurrent pancreatitis, hx of EtOH abuse (sober x3 years). She was discharged 7/14 after admission for acute on chronic pancreatitis with pseudocyst formation. She was treated conservatively and was able to discharge home. Her symptoms gradually returned and she presented to the ED on 7/20.  Diet was advanced from NPO to CLD today at 1044. She has not had anything PO since that time. She was discharged on 7/14 and reports that since that time she has had very little PO. She was mainly consuming liquids. She was feeling full very quickly with minimal intake and would often feel nauseated and have increased abdominal pain with any PO intake.  She currently meets criteria for moderate malnutrition, but is at high risk of progressing to severe malnutrition given ongoing pancreatitis, weight loss, and ongoing poor intakes.   Weight yesterday was 207 lb and weight a week ago was 240 lb. This indicates 33 lb weight loss in 1 week.  Question accuracy of this. Weight on 05/03/19 was 249 lb which would indicate 42 lb weight loss (17% body weight) in the past 7 months; significant for time frame.   Per notes: - imaging does not show any worsening of pancreatitis although inflammation is persistent - ongoing small intra-pancreatic pseudocyst with slight increase in size   Labs reviewed; Cl: 97 mmol/l, BUN: <5 mg/dl, lipase: 89 u/l. Medications reviewed.  IVF; D5-LR @ 125 ml/hr (510 kcal).    NUTRITION - FOCUSED PHYSICAL EXAM:    Most Recent Value  Orbital Region No depletion  Upper Arm Region Mild depletion  Thoracic and Lumbar Region No depletion  Buccal Region Mild depletion  Temple Region No depletion  Clavicle Bone Region Mild depletion  Clavicle and Acromion Bone Region Mild depletion  Scapular Bone Region Unable to assess  Dorsal Hand No depletion  Patellar Region No depletion  Anterior Thigh Region Unable to assess  Posterior Calf Region Mild depletion  Edema (RD Assessment) Mild  [BLE]  Hair Reviewed  Eyes Reviewed  Mouth Reviewed  Skin Reviewed  Nails Reviewed       Diet Order:   Diet Order            Diet clear liquid Room service appropriate? Yes; Fluid consistency: Thin  Diet effective now                 EDUCATION NEEDS:   Not appropriate for education at this time  Skin:  Skin Assessment: Reviewed RN Assessment  Last BM:  7/21  Height:   Ht Readings from Last 1 Encounters:  11/06/19 5\' 5"  (1.651  m)    Weight:   Wt Readings from Last 1 Encounters:  11/06/19 94 kg    Estimated Nutritional Needs:  Kcal:  2250-2500 kcal Protein:  110-125 grams Fluid:  >/= 2.2 L/day     Trenton Gammon, MS, RD, LDN, CNSC Inpatient Clinical Dietitian RD pager # available in [facility]  After hours/weekend pager # available in [facility]

## 2019-11-07 NOTE — Consult Note (Addendum)
Consultation  Referring Provider:  [facility] Primary Care Physician:  [name], MD Primary Gastroenterologist:  Dr.Stark ?  Reason for Consultation:  HPI: Kara [name] is a 34 y.o. female, known to the GI service from recent admission 7/29 through 10/31/2019.  She was admitted with subacute pancreatitis after being ill at home for about 2 weeks prior to admission.  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  Patient has been abstinent over the past 3 years.  She had one episode of pancreatitis for which she was hospitalized about a year ago elsewhere. CT on 10/25/2019 showed an edematous pancreatic head and uncinate process, and small developing pseudocysts including an intrapancreatic pseudocyst.  He had multiple ill-defined peripherally enhancing fluid collections in the left upper quadrant the largest of these measured 6.2 x 2.9 x 2.4 cm under the left hemidiaphragm.  There was a collection anterior to the spleen measuring 7.1 x 5.1 x 5.8 cm.  Also noted to have a chronically occluded splenic vein with multiple intra-abdominal collaterals and hepatomegaly without cirrhosis. She had a rather prolonged course with persistent difficulty being able to tolerate p.o.'s.  She had a short trial of nasoenteric feedings into the stomach which she did not tolerate.  Eventually a postpyloric tube was placed however became dislodged within 24 hours of placement.  Thereafter we were gradually able to advance her diet and she was able to tolerate supplements and small feedings of solid food.  She says when she was discharged on 10/31/2019 she had only been given 12 tablets of oxycodone which she did take over the few following days and then did not have any more pain medication.  She says within 2 days of returning to home she was unable to eat, was nauseated and then by Monday and Tuesday of this week was having nausea and vomiting, not keeping down  any p.o.'s and complained of progressive weakness and some lightheadedness with standing. She says she has not had any increase in her abdominal pain, just has not had any improvement since discharge from the hospital.  No fever or chills that she is aware of, she has been having bowel movements. She also mentions that she is supposed to return to work next week.  She has been working remotely, but her employer is requiring return to the workplace next week.  She is in the process of filling out FMLA forms, she does not feel that she will be well enough to return to work over the next few weeks.   Repeat labs last p.m. lactate within normal limits, WBC 11.4 hemoglobin 12.6 LFTs within normal limits, lipase 89. Repeat CT of the abdomen and pelvis showed peripancreatic inflammatory changes extending into the greater omentum have improved there is an intrapancreatic pseudocyst measuring 1.5 x 2 cm slightly increased, no pancreatic necrosis, there is a small amount of phlegmon in the subdiaphragmatic area measuring 2.5 x 3 cm.    Past Medical History:  Diagnosis Date  . Alcoholism in remission (HCC)    per pt in remission since 2017  . Anemia   . AR (allergic rhinitis)    allergist-- dr Lucie Leather  . Biliary dyskinesia   . Diastolic CHF (HCC)    followd by dr Demetrius Charity. [facility]  . Environmental allergies   . GERD (gastroesophageal reflux disease)   . Headache   . History of acute pancreatitis    alcoholic pancreatitis ,  2016 and 2017  .  History of sepsis 06/12/2016   with CAP  . History of syncope   . 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 hematoma due to coumadin treated with IR embolization of the IMA branches  . Hypertension   . Polyneuropathy   . Prolonged QT syndrome    cardiologist-- dr Eden Emmsnishan (notes in epic)  . Severe persistent asthma    pulmonologist-- dr dr Belva Cromep. mannam (notes in epic)    Past Surgical History:    Procedure Laterality Date  . CHOLECYSTECTOMY N/A 06/21/2018   Procedure: LAPAROSCOPIC CHOLECYSTECTOMY;  Surgeon: Abigail MiyamotoBlackman, Douglas, MD;  Location: [facility];  Service: General;  Laterality: N/A;  . ESOPHAGOGASTRODUODENOSCOPY N/A 07/17/2014   Procedure: ESOPHAGOGASTRODUODENOSCOPY (EGD);  Surgeon: Dorena CookeyJohn Hayes, MD;  [facility];  Service: Endoscopy;  Laterality: N/A;  . TYMPANOSTOMY TUBE PLACEMENT Bilateral 11 MONTHS OLD  . VIDEO BRONCHOSCOPY Bilateral 10/08/2016   Procedure: VIDEO BRONCHOSCOPY WITH FLUORO;  Surgeon: Chilton GreathouseMannam, Praveen, MD;  Location: [facility];  Service: Cardiopulmonary;  Laterality: Bilateral;    Prior to Admission medications   Medication Sig Start Date End Date Taking? Authorizing Provider  albuterol (PROVENTIL) (2.5 MG/3ML) 0.083% nebulizer solution Take 3 mLs (2.5 mg total) by nebulization every 4 (four) hours as needed for wheezing or shortness of breath. 05/03/19  Yes Ambs, Norvel RichardsAnne M, FNP  albuterol (VENTOLIN HFA) 108 (90 Base) MCG/ACT inhaler 2 puffs every 4 hours as needed for coughing or wheezing. Patient taking differently: Inhale 2 puffs into the lungs every 6 (six) hours as needed for wheezing or shortness of breath. 2 puffs every 4 hours as needed for coughing or wheezing. 06/04/19  Yes Ambs, Norvel RichardsAnne M, FNP  BREO ELLIPTA 200-25 MCG/INH AEPB INHALE 1 PUFF INTO THE LUNGS DAILY 05/28/19  Yes Kozlow, Alvira PhilipsEric J, MD  buPROPion [facility](WELLBUTRIN SR) 150 MG 12 hr tablet Take 150-300 mg by mouth See admin instructions. Takes 1 tablet in the morning and 2 tablets at bedtime 04/10/19  Yes [provider]  chlorhexidine (PERIDEX) 0.12 % solution Use as directed 15 mLs in the mouth or throat 2 (two) times daily. After meals Patient taking differently: Use as directed 15 mLs in the mouth or throat daily as needed (dental plaque). After meals  05/03/19  Yes Ambs, Norvel RichardsAnne M, FNP  Cholecalciferol (VITAMIN D PO) Take 1 tablet by mouth daily.    Yes [provider]  DUPIXENT 300 MG/2ML  prefilled syringe INJECT 300 MG SUBCUTANEOUSLY EVERY OTHER WEEK Patient taking differently: Inject 300 mg into the skin every 14 (fourteen) days.  04/09/19  Yes Kozlow, Alvira PhilipsEric J, MD  gabapentin (NEURONTIN) 300 MG capsule TAKE 3 CAPSULES BY MOUTH TWICE DAILY Patient taking differently: Take 900 mg by mouth 2 (two) times daily.  12/27/18  Yes Sater, Pearletha Furlichard A, MD  lipase/protease/amylase 24000-76000 units CPEP Take 1 capsule (24,000 Units total) by mouth 3 (three) times daily before meals. 10/31/19 11/30/19 Yes Lanae BoastKc, Ramesh, MD  Melatonin 10 MG TABS Take 10 mg by mouth at bedtime.   Yes [provider]  metoCLOPramide (REGLAN) 10 MG tablet Take 1 tablet (10 mg total) by mouth every 8 (eight) hours as needed for up to 12 doses for nausea. 10/31/19  Yes Lanae BoastKc, Ramesh, MD  metoprolol succinate (TOPROL-XL) 50 MG 24 hr tablet Take 1 tablet (50 mg total) by mouth daily. 06/11/19  Yes Wendall StadeNishan, Peter C, MD  montelukast (SINGULAIR) 10 MG tablet TAKE 1 TABLET(10 MG) BY MOUTH AT BEDTIME Patient taking differently:  Take 10 mg by mouth at bedtime.  10/23/19  Yes Ambs, Norvel Richards, FNP  Multiple Vitamins-Minerals (MULTIVITAMIN WITH MINERALS) tablet Take 1 tablet by mouth daily.   Yes [provider]  nicotine (NICODERM CQ - DOSED IN MG/24 HOURS) 21 mg/24hr patch Place 21 mg onto the skin daily.   Yes [provider]  omeprazole (PRILOSEC) 40 MG capsule TAKE 1 CAPSULE(40 MG) BY MOUTH DAILY Patient taking differently: Take 40 mg by mouth daily.  05/28/19  Yes Ambs, Norvel Richards, FNP  oxyCODONE-acetaminophen (PERCOCET/ROXICET) 5-325 MG tablet Take 1 tablet by mouth daily as needed for moderate pain or severe pain.   Yes [provider]  QUEtiapine (SEROQUEL) 100 MG tablet Take 100 mg by mouth at bedtime. 03/23/19  Yes [provider]  rosuvastatin (CRESTOR) 10 MG tablet Take 10 mg by mouth daily.   Yes [provider]  sertraline (ZOLOFT) 50 MG tablet Take 75 mg by mouth daily.   Yes [provider]  thiamine 100 MG tablet Take 100 mg by mouth daily.   Yes [provider]  umeclidinium bromide (INCRUSE ELLIPTA) 62.5 MCG/INH AEPB Inhale 1 puff into the lungs daily. 05/03/19  Yes Ambs, Norvel Richards, FNP  EPINEPHrine (EPIPEN 2-PAK) 0.3 mg/0.3 mL IJ SOAJ injection Inject 0.3 mLs (0.3 mg total) into the muscle once as needed (for severe allergic reaction). 07/11/18   Kozlow, Alvira Philips, MD  furosemide (LASIX) 20 MG tablet Take 1 tablet (20 mg total) by mouth daily. Patient not taking: Reported on 11/06/2019 06/11/19   Wendall Stade, MD    Current Facility-Administered Medications  Medication Dose Route Frequency Provider Last Rate Last Admin  . dextrose 5 % in lactated ringers infusion   Intravenous Continuous Rometta Emery, MD 125 mL/hr at 11/07/19 0421 New Bag at 11/07/19 0421  . enoxaparin (LOVENOX) injection 40 mg  40 mg Subcutaneous Q24H Earlie Lou L, MD   40 mg at 11/07/19 0849  . HYDROmorphone (DILAUDID) injection 1 mg  1 mg Intravenous Q2H PRN Rometta Emery, MD   1 mg at 11/07/19 0849  . ondansetron (ZOFRAN) tablet 4 mg  4 mg Oral Q6H PRN Rometta Emery, MD       Or  . ondansetron (ZOFRAN) injection 4 mg  4 mg Intravenous Q6H PRN Rometta Emery, MD        Allergies as of 11/06/2019 - Review Complete 11/06/2019  Allergen Reaction Noted  . Nsaids Other (See Comments) 06/13/2018  . Peanuts [peanut oil] Hives and Itching 07/06/2014    Family History  Problem Relation Age of Onset  . Heart failure Mother   . Heart failure Father   . Diabetes Father   . Heart failure Maternal Grandfather     Social History   Socioeconomic History  . Marital status: Married    Spouse name: [name]  . Number of children: 0  . Years of education: Not on file  . Highest education level: Not on file  Occupational History  . Occupation: Engineer, production  Tobacco Use  . Smoking status: Current Every Day Smoker    Packs/day: 0.50    Years: 5.00    Pack years:  2.50    Types: Cigarettes  . Smokeless tobacco: Never Used  Vaping Use  . Vaping Use: Never used  Substance and Sexual Activity  . Alcohol use: Not Currently    Alcohol/week: 0.0 standard drinks    Comment: pt hx alcohol abuse , in remission since  2017  . Drug use: Not Currently    Comment: "POT WHEN I WAS IN HIGH SCHOOL"  . Sexual activity: Yes    Birth control/protection: None  Other Topics Concern  . Not on file  Social History Narrative   Married.   Social Determinants of Health   Financial Resource Strain:   . Difficulty of Paying Living Expenses:   Food Insecurity:   . Worried About Programme researcher, broadcasting/film/video in the Last Year:   . Barista in the Last Year:   Transportation Needs:   . Freight forwarder (Medical):   Marland Kitchen Lack of Transportation (Non-Medical):   Physical Activity:   . Days of Exercise per Week:   . Minutes of Exercise per Session:   Stress:   . Feeling of Stress :   Social Connections:   . Frequency of Communication with Friends and Family:   . Frequency of Social Gatherings with Friends and Family:   . Attends Religious Services:   . Active Member of Clubs or Organizations:   . Attends Banker Meetings:   Marland Kitchen Marital Status:   Intimate Partner Violence:   . Fear of Current or Ex-Partner:   . Emotionally Abused:   Marland Kitchen Physically Abused:   . Sexually Abused:     Review of Systems: Pertinent positive and negative review of systems were noted in the above HPI section.  All other review of systems was otherwise negative.Marland Kitchen  Physical Exam: Vital signs in last 24 hours: Temp:  [98.1 F (36.7 C)-98.3 F (36.8 C)] 98.1 F (36.7 C) (07/21 0317) Pulse Rate:  [84-118] 106 (07/21 0317) Resp:  [14-21] 17 (07/21 0317) BP: (104-141)/(75-88) 120/81 (07/21 0317) SpO2:  [93 %-100 %] 97 % (07/21 0317) Weight:  [94 kg-98.9 kg] 94 kg (07/20 1425) Last BM Date: 11/03/19 General:   Alert,  Well-developed, well-nourished, white female pleasant and  cooperative in NAD Head:  Normocephalic and atraumatic. Eyes:  Sclera clear, no icterus.   Conjunctiva pink. Ears:  Normal auditory acuity. Nose:  No deformity, discharge,  or lesions. Mouth:  No deformity or lesions.   Neck:  Supple; no masses or thyromegaly. Lungs:  Clear throughout to auscultation.   No wheezes, crackles, or rhonchi. Heart: Tacky regular rate and rhythm; no murmurs, clicks, rubs,  or gallops. Abdomen:  Soft, tender across the upper abdomen, less fulfilling than previously no guarding or rebound, bowel sounds are present.   Rectal:  Deferred  Msk:  Symmetrical without gross deformities. . Pulses:  Normal pulses noted. Extremities:  Without clubbing or edema. Neurologic:  Alert and  oriented x4;  grossly normal neurologically. Skin:  Intact without significant lesions or rashes.. Psych:  Alert and cooperative. Normal mood and affect.  Intake/Output from previous day: 07/20 0701 - 07/21 0700 In: 1200.7 [I.V.:200.7; IV Piggyback:1000] Out: -  Intake/Output this shift: Total I/O In: -  Out: 100 [Urine:100]  Lab Results: Recent Labs    11/06/19 1205 11/07/19 0606  WBC 11.4* 7.0  HGB 12.6 10.3*  HCT 41.1 34.5*  PLT 468* 316   BMET Recent Labs    11/06/19 1205 11/07/19 0606  NA 138  --   K 3.8  --   CL 97*  --   CO2 26  --   GLUCOSE 130*  --   BUN <5*  --   CREATININE 0.63 0.60  CALCIUM 9.1  --    LFT Recent Labs    11/06/19 1205  PROT 7.8  ALBUMIN 3.0*  AST 15  ALT 11  ALKPHOS 177*  BILITOT 1.0   PT/INR No results for input(s): LABPROT, INR in the last 72 hours. Hepatitis Panel No results for input(s): HEPBSAG, HCVAB, HEPAIGM, HEPBIGM in the last 72 hours.    IMPRESSION:  #69 34 year old white female with history of recurrent pancreatitis.  No imaging findings diagnostic of chronic pancreatitis.  Initial episodes of pancreatitis were secondary to EtOH abuse however by patient reports she has been abstinent over the past 3  years. Patient had very recent prolonged hospitalization with acute/subacute pancreatitis with associated phlegmon/small pseudocysts.  She had a lot of difficulty during hospitalization with oral intake but was eventually able to tolerate supplements and small frequent solid meals. Patient was discharged 1 week ago today.  She reports progressive decline since discharge from the hospital with ongoing abdominal pain which has been fairly severe.  This is been associated with poor oral intake ongoing nausea progressive weakness and intermittent vomiting.  Repeat imaging last night does not show any evidence of worsening of her pancreatitis though she does have persistent pancreatic inflammation.  The phlegmons are improved.  She has a small intra pancreatic pseudocyst which has slightly increased in size.  I think pain control has been an issue as she did not have any analgesics.  #2 Chronic Splenic vein occlusion #3 fatty liver  Plan; continue IV analgesics and as needed IV antiemetic Start clear liquid diet and resource breeze supplements between meals I suspect she will be several more weeks before resolving this episode, and may need to be out on FMLA for some period of time.  I think she will also need analgesics for pain control over the next few weeks.  Thank you we will follow with you   Anelle Parlow EsterwoodPA-C  11/07/2019, 9:04 AM

## 2019-11-07 NOTE — Progress Notes (Signed)
Patient has no urine output since last night; pt stated last voided 1400 of 11/06/2019; bladder scan showed 80ml. Provider on call made aware; waiting for further order.

## 2019-11-07 NOTE — Plan of Care (Signed)

## 2019-11-07 NOTE — Progress Notes (Signed)
PROGRESS NOTE  Kara [name] HYQ:[id number] DOB: 03/27/1986 DOA: 11/06/2019 PCP: [name], MD   LOS: 1 day   Brief Narrative / Interim history: 34 year old female with history of recurrent pancreatitis, history of EtOH use but not in the past 3 years, was just discharged on July 14 from hospital for acute on chronic pancreatitis with pseudocyst formation.  She was treated conservatively, improved and then sent home.  Her symptoms gradually returned, and patient came back to the hospital and was admitted on 7/20.  She tells me today that since her discharge she was barely able to eat anything, feels full early, and with food she is having worsening pain as well as nausea.  Subjective / 24h Interval events: Complains of ongoing abdominal pain this morning.  Assessment & Plan: Principal Problem Acute on chronic pancreatitis-secondary to EtOH use, however has not used any alcohol in the past 3 years.  CT scan on admission actually showed improved peripancreatic inflammatory changes, along with a pseudocysts which appears relatively stable since prior examination.  Due to persistent symptoms with poor p.o. intake at home and repeat hospitalization gastroenterology has been consulted.  Appreciate input.  Allow clear liquid diet  Active Problems Alcohol abuse, in remission-has not had EtOH in past 3 years.  Encouraged ongoing cessation  Chronic diastolic CHF-2D echo couple years ago fairly unremarkable.  Monitor volume status with fluids.  Scheduled Meds: . enoxaparin (LOVENOX) injection  40 mg Subcutaneous Q24H  . feeding supplement  1 Container Oral TID BM   Continuous Infusions: . dextrose 5% lactated ringers 125 mL/hr at 11/07/19 0421   PRN Meds:.HYDROmorphone (DILAUDID) injection, hydrOXYzine, ondansetron **OR** ondansetron (ZOFRAN) IV  Diet Orders (From admission, onward)    Start     Ordered   11/07/19 1044  Diet clear liquid Room service appropriate? Yes; Fluid consistency:  Thin  Diet effective now       Question Answer Comment  Room service appropriate? Yes   Fluid consistency: Thin      11/07/19 1044          DVT prophylaxis: enoxaparin (LOVENOX) injection 40 mg Start: 11/07/19 0800     Code Status: Full Code  Family Communication: no family at bedside   Status is: Inpatient  Remains inpatient appropriate because:Inpatient level of care appropriate due to severity of illness  Dispo: The patient is from: Home              Anticipated d/c is to: Home              Anticipated d/c date is: 3 days              Patient currently is not medically stable to d/c.  Consultants:  GI  Procedures:  none  Microbiology  none  Antimicrobials: none    Objective: Vitals:   11/07/19 0000 11/07/19 0100 11/07/19 0200 11/07/19 0317  BP: 120/85 125/81 124/86 120/81  Pulse: 100 98 93 (!) 106  Resp: 16 15 14 17   Temp:    98.1 F (36.7 C)  TempSrc:    Oral  SpO2: 96% 97% 96% 97%  Weight:      Height:        Intake/Output Summary (Last 24 hours) at 11/07/2019 1104 Last data filed at 11/07/2019 0804 Gross per 24 hour  Intake 1200.65 ml  Output 100 ml  Net 1100.65 ml   Filed Weights   11/06/19 1118 11/06/19 1425  Weight: 98.9 kg 94 kg    Examination:  Constitutional: NAD Eyes: no scleral icterus ENMT: Mucous membranes are moist.  Neck: normal, supple Respiratory: clear to auscultation bilaterally, no wheezing, no crackles.  Cardiovascular: Regular rate and rhythm, no murmurs / rubs / gallops. No LE edema.  Abdomen: Bowel sounds positive.  Deferred palpation due to pain and known pancreatitis Musculoskeletal: no clubbing / cyanosis.  Skin: no rashes Neurologic: non focal, ambulatory   Data Reviewed: I have independently reviewed following labs and imaging studies   CBC: Recent Labs  Lab 11/01/19 1947 11/06/19 1205 11/07/19 0606  WBC 9.0 11.4* 7.0  HGB 11.1* 12.6 10.3*  HCT 35.7* 41.1 34.5*  MCV 86.9 89.3 91.3  PLT 546* 468*  316   Basic Metabolic Panel: Recent Labs  Lab 11/01/19 1947 11/06/19 1205 11/07/19 0606  NA 138 138  --   K 3.8 3.8  --   CL 99 97*  --   CO2 24 26  --   GLUCOSE 107* 130*  --   BUN 5* <5*  --   CREATININE 0.55 0.63 0.60  CALCIUM 8.9 9.1  --    Liver Function Tests: Recent Labs  Lab 11/01/19 1947 11/06/19 1205  AST 15 15  ALT 9 11  ALKPHOS 210* 177*  BILITOT 0.4 1.0  PROT 7.4 7.8  ALBUMIN 2.8* 3.0*   Coagulation Profile: No results for input(s): INR, PROTIME in the last 168 hours. HbA1C: No results for input(s): HGBA1C in the last 72 hours. CBG: No results for input(s): GLUCAP in the last 168 hours.  Recent Results (from the past 240 hour(s))  SARS Coronavirus 2 by RT PCR (hospital order, performed in [facility] hospital lab) Nasopharyngeal Nasopharyngeal Swab     Status: None   Collection Time: 11/06/19  9:46 PM   Specimen: Nasopharyngeal Swab  Result Value Ref Range Status   SARS Coronavirus 2 NEGATIVE NEGATIVE Final    Comment: (NOTE) SARS-CoV-2 target nucleic acids are NOT DETECTED.  The SARS-CoV-2 RNA is generally detectable in upper and lower respiratory specimens during the acute phase of infection. The lowest concentration of SARS-CoV-2 viral copies this assay can detect is 250 copies / mL. A negative result does not preclude SARS-CoV-2 infection and should not be used as the sole basis for treatment or other patient management decisions.  A negative result may occur with improper specimen collection / handling, submission of specimen other than nasopharyngeal swab, presence of viral mutation(s) within the areas targeted by this assay, and inadequate number of viral copies (<250 copies / mL). A negative result must be combined with clinical observations, patient history, and epidemiological information.  Fact Sheet for Patients:   BoilerBrush.com.cy  Fact Sheet for Healthcare  Providers: https://pope.com/  This test is not yet approved or  cleared by the [place] FDA and has been authorized for detection and/or diagnosis of SARS-CoV-2 by FDA under an Emergency Use Authorization (EUA).  This EUA will remain in effect (meaning this test can be used) for the duration of the COVID-19 declaration under Section 564(b)(1) of the Act, 21 U.S.C. section 360bbb-3(b)(1), unless the authorization is terminated or revoked sooner.  Performed at [facility], [address] [place], [place] [address]      Radiology Studies: CT ABDOMEN PELVIS W CONTRAST  Result Date: 11/06/2019 CLINICAL DATA:  Acute pancreatitis, abdominal pain, syncope EXAM: CT ABDOMEN AND PELVIS WITH CONTRAST TECHNIQUE: Multidetector CT imaging of the abdomen and pelvis was performed using the standard protocol following bolus administration of intravenous contrast. CONTRAST:  OMNIPAQUE IOHEXOL 300 MG/ML  SOLN COMPARISON:  10/25/2019 FINDINGS: Lower chest: Small left pleural effusion and associated left basilar atelectasis is again identified. The visualized heart and pericardium are unremarkable. Hepatobiliary: Cholecystectomy has been performed. Moderate hepatomegaly is again noted and appears stable. No intra or extrahepatic biliary ductal dilation. Pancreas: Peripancreatic inflammatory changes extending into the greater omentum have improved in the interval since the prior examination. Intrapancreatic pseudocyst, axial image # 30/2 has increased slightly, measuring 1.5 x 2.0 cm. This courses superiorly along the greater curvature of the stomach toward the fundus, better noted on coronal images number 76-81. There is slightly heterogeneous enhancement of the pancreatic parenchyma within the pancreatic head, however, possibly related to interstitial edema, however, no areas of frank parenchymal necrosis are identified. The pancreatic duct is not dilated. No  parenchymal calcifications are seen. A small amount of phlegmon is again seen adjacent to the fundus of the stomach at the gastroesophageal junction, similar to that noted on prior examination there is a a subdiaphragmatic phlegmon again identified, measuring 2.5 x 3.1 cm at axial image # 11/2 grossly unchanged from prior examination. Spleen: The spleen is mildly enlarged, unchanged. 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 Adrenals/Urinary Tract: Adrenal glands and kidneys are unremarkable. Bladder is decompressed Stomach/Bowel: Large and small bowel are unremarkable. Appendix normal. No free intraperitoneal gas or fluid. Stable loculated mesenteric fluid collection within the left mid abdomen adjacent to several surgical clips possibly representing a postoperative seroma or chronic hematoma. This measures 2.4 x 4.0 cm at axial image # 54/2. Vascular/Lymphatic: No pathologic adenopathy within the abdomen and pelvis. Aside from gastroepiploic collateral secondary to thrombosis of the splenic vein, the abdominal vasculature is unremarkable. In particular, the superior mesenteric vein and portal vein are patent. Reproductive: Unremarkable Other: The rectum is unremarkable Musculoskeletal: No acute bone abnormality. IMPRESSION: Improving peripancreatic inflammatory changes. Heterogeneous enhancement within the head of the pancreas likely relates to interstitial edema without frank parenchymal necrosis clearly identified. Inflammatory collections are noted within the left upper quadrant the abdomen within the subdiaphragmatic region and perigastric regions adjacent to the gastric fundus at the diaphragmatic hiatus and along the greater curvature extending into the mid body of the pancreas. These appear relatively stable since prior examination. Thrombosis of the splenic vein with numerous  gastroepiploic collaterals, unchanged. Stable moderate hepatomegaly.  Stable mild splenomegaly. Electronically Signed   By: Helyn Numbers MD   On: 11/06/2019 21:09   Pamella Pert, MD, PhD [facility]  Between 7 am - 7 pm I am available, please contact me via Amion or Securechat  Between 7 pm - 7 am I am not available, please contact night coverage MD/APP via Amion

## 2019-11-08 DIAGNOSIS — K863 Pseudocyst of pancreas: Secondary | ICD-10-CM

## 2019-11-08 DIAGNOSIS — F1011 Alcohol abuse, in remission: Secondary | ICD-10-CM

## 2019-11-08 LAB — COMPREHENSIVE METABOLIC PANEL
ALT: 12 U/L (ref 0–44)
AST: 28 U/L (ref 15–41)
Albumin: 2.8 g/dL — ABNORMAL LOW (ref 3.5–5.0)
Alkaline Phosphatase: 158 U/L — ABNORMAL HIGH (ref 38–126)
Anion gap: 10 (ref 5–15)
BUN: <5 mg/dL — ABNORMAL LOW (ref 6–20)
CO2: 27 mmol/L (ref 22–32)
Calcium: 8.7 mg/dL — ABNORMAL LOW (ref 8.9–10.3)
Chloride: 101 mmol/L (ref 98–111)
Creatinine, Ser: 0.62 mg/dL (ref 0.44–1.00)
GFR calc Af Amer: >60 mL/min (ref >60)
GFR calc non Af Amer: >60 mL/min (ref >60)
Glucose, Bld: 118 mg/dL — ABNORMAL HIGH (ref 70–99)
Potassium: 3.2 mmol/L — ABNORMAL LOW (ref 3.5–5.1)
Sodium: 138 mmol/L (ref 135–145)
Total Bilirubin: 0.4 mg/dL (ref 0.3–1.2)
Total Protein: 6.9 g/dL (ref 6.5–8.1)

## 2019-11-08 LAB — CBC
HCT: 39.2 % (ref 36.0–46.0)
Hemoglobin: 11.4 g/dL — ABNORMAL LOW (ref 12.0–15.0)
MCH: 26.8 pg (ref 26.0–34.0)
MCHC: 29.1 g/dL — ABNORMAL LOW (ref 30.0–36.0)
MCV: 92.2 fL (ref 80.0–100.0)
Platelets: 347 10*3/uL (ref 150–400)
RBC: 4.25 MIL/uL (ref 3.87–5.11)
RDW: 13.2 % (ref 11.5–15.5)
WBC: 8.2 10*3/uL (ref 4.0–10.5)
nRBC: 0 % (ref 0.0–0.2)

## 2019-11-08 MED ORDER — BUPROPION HCL ER (SR) 150 MG PO TB12
150.0000 mg | ORAL_TABLET | Freq: Every day | ORAL | Status: DC
  Administered 2019-11-08 – 2019-11-12 (×5): 150 mg via ORAL
  Filled 2019-11-08 (×5): qty 1

## 2019-11-08 MED ORDER — SERTRALINE HCL 50 MG PO TABS
75.0000 mg | ORAL_TABLET | Freq: Every day | ORAL | Status: DC
  Administered 2019-11-08 – 2019-11-12 (×5): 75 mg via ORAL
  Filled 2019-11-08 (×5): qty 1

## 2019-11-08 MED ORDER — OXYCODONE HCL 5 MG PO TABS
10.0000 mg | ORAL_TABLET | ORAL | Status: DC | PRN
  Administered 2019-11-08 – 2019-11-10 (×11): 10 mg via ORAL
  Filled 2019-11-08 (×11): qty 2

## 2019-11-08 MED ORDER — METOPROLOL SUCCINATE ER 50 MG PO TB24
50.0000 mg | ORAL_TABLET | Freq: Every day | ORAL | Status: DC
  Administered 2019-11-08 – 2019-11-12 (×5): 50 mg via ORAL
  Filled 2019-11-08 (×5): qty 1

## 2019-11-08 MED ORDER — BUPROPION HCL ER (SR) 150 MG PO TB12
300.0000 mg | ORAL_TABLET | Freq: Every day | ORAL | Status: DC
  Administered 2019-11-08 – 2019-11-11 (×4): 300 mg via ORAL
  Filled 2019-11-08 (×4): qty 2

## 2019-11-08 MED ORDER — QUETIAPINE FUMARATE 50 MG PO TABS
100.0000 mg | ORAL_TABLET | Freq: Every day | ORAL | Status: DC
  Administered 2019-11-08 – 2019-11-11 (×4): 100 mg via ORAL
  Filled 2019-11-08 (×4): qty 2

## 2019-11-08 MED ORDER — POTASSIUM CHLORIDE CRYS ER 20 MEQ PO TBCR
40.0000 meq | EXTENDED_RELEASE_TABLET | Freq: Once | ORAL | Status: AC
  Administered 2019-11-08: 40 meq via ORAL
  Filled 2019-11-08: qty 2

## 2019-11-08 MED ORDER — NICOTINE 21 MG/24HR TD PT24
21.0000 mg | MEDICATED_PATCH | Freq: Every day | TRANSDERMAL | Status: DC
  Filled 2019-11-08 (×3): qty 1

## 2019-11-08 MED ORDER — MELATONIN 5 MG PO TABS
10.0000 mg | ORAL_TABLET | Freq: Every day | ORAL | Status: DC
  Administered 2019-11-08 – 2019-11-11 (×4): 10 mg via ORAL
  Filled 2019-11-08 (×4): qty 2

## 2019-11-08 MED ORDER — ROSUVASTATIN CALCIUM 10 MG PO TABS
10.0000 mg | ORAL_TABLET | Freq: Every day | ORAL | Status: DC
  Administered 2019-11-08 – 2019-11-12 (×5): 10 mg via ORAL
  Filled 2019-11-08 (×5): qty 1

## 2019-11-08 MED ORDER — GABAPENTIN 300 MG PO CAPS
900.0000 mg | ORAL_CAPSULE | Freq: Two times a day (BID) | ORAL | Status: DC
  Administered 2019-11-08 – 2019-11-12 (×9): 900 mg via ORAL
  Filled 2019-11-08 (×9): qty 3

## 2019-11-08 MED ORDER — PANCRELIPASE (LIP-PROT-AMYL) 12000-38000 UNITS PO CPEP
36000.0000 [IU] | ORAL_CAPSULE | Freq: Three times a day (TID) | ORAL | Status: DC
  Administered 2019-11-08 – 2019-11-12 (×13): 36000 [IU] via ORAL
  Filled 2019-11-08 (×13): qty 3

## 2019-11-08 NOTE — Progress Notes (Addendum)
Patient ID: Kara [name], female   DOB: July 24, 1985, 34 y.o.   MRN: [id number]    Progress Note   Subjective   Day # 2  CC; subacute pancreatitis   K+ 3.2/ creat.62  alk phos 158-  WBC 8.2, hgb 11.4  Patient is feeling a bit better, pain is under control.  She has started on oral analgesics. Denies any nausea or vomiting and has been keeping down clear liquids though does not feel ready to advance diet as yet.    Objective   Vital signs in last 24 hours: Temp:  [98.3 F (36.8 C)-98.7 F (37.1 C)] 98.7 F (37.1 C) (07/22 0639) Pulse Rate:  [97-110] 108 (07/22 0639) Resp:  [15-18] 15 (07/22 0639) BP: (110-131)/(68-93) 110/87 (07/22 0639) SpO2:  [97 %-99 %] 99 % (07/22 0639) Last BM Date: 11/07/19 General:    Young white female in NAD Heart:  Regular rate and rhythm; no murmurs Lungs: Respirations even and unlabored, lungs CTA bilaterally Abdomen:  Soft, tender across the upper abdomen, no guarding and nondistended. Normal bowel sounds. Extremities:  Without edema. Neurologic:  Alert and oriented,  grossly normal neurologically. Psych:  Cooperative. Normal mood and affect.  Intake/Output from previous day: 07/21 0701 - 07/22 0700 In: 2624.5 [P.O.:360; I.V.:2264.5] Out: 100 [Urine:100] Intake/Output this shift: No intake/output data recorded.  Lab Results: Recent Labs    11/06/19 1205 11/07/19 0606 11/08/19 0330  WBC 11.4* 7.0 8.2  HGB 12.6 10.3* 11.4*  HCT 41.1 34.5* 39.2  PLT 468* 316 347   BMET Recent Labs    11/06/19 1205 11/07/19 0606 11/08/19 0330  NA 138  --  138  K 3.8  --  3.2*  CL 97*  --  101  CO2 26  --  27  GLUCOSE 130*  --  118*  BUN <5*  --  <5*  CREATININE 0.63 0.60 0.62  CALCIUM 9.1  --  8.7*   LFT Recent Labs    11/08/19 0330  PROT 6.9  ALBUMIN 2.8*  AST 28  ALT 12  ALKPHOS 158*  BILITOT 0.4   PT/INR No results for input(s): LABPROT, INR in the last 72 hours.  Studies/Results: CT ABDOMEN PELVIS W CONTRAST  Result Date:  11/06/2019 CLINICAL DATA:  Acute pancreatitis, abdominal pain, syncope EXAM: CT ABDOMEN AND PELVIS WITH CONTRAST TECHNIQUE: Multidetector CT imaging of the abdomen and pelvis was performed using the standard protocol following bolus administration of intravenous contrast. CONTRAST:  OMNIPAQUE IOHEXOL 300 MG/ML  SOLN COMPARISON:  10/25/2019 FINDINGS: Lower chest: Small left pleural effusion and associated left basilar atelectasis is again identified. The visualized heart and pericardium are unremarkable. Hepatobiliary: Cholecystectomy has been performed. Moderate hepatomegaly is again noted and appears stable. No intra or extrahepatic biliary ductal dilation. Pancreas: Peripancreatic inflammatory changes extending into the greater omentum have improved in the interval since the prior examination. Intrapancreatic pseudocyst, axial image # 30/2 has increased slightly, measuring 1.5 x 2.0 cm. This courses superiorly along the greater curvature of the stomach toward the fundus, better noted on coronal images number 76-81. There is slightly heterogeneous enhancement of the pancreatic parenchyma within the pancreatic head, however, possibly related to interstitial edema, however, no areas of frank parenchymal necrosis are identified. The pancreatic duct is not dilated. No parenchymal calcifications are seen. A small amount of phlegmon is again seen adjacent to the fundus of the stomach at the gastroesophageal junction, similar to that noted on prior examination there is a a subdiaphragmatic phlegmon again identified, measuring  2.5 x 3.1 cm at axial image # 11/2 grossly unchanged from prior examination. Spleen: The spleen is mildly enlarged, unchanged. 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 Adrenals/Urinary Tract: Adrenal glands and kidneys are unremarkable. Bladder is decompressed  Stomach/Bowel: Large and small bowel are unremarkable. Appendix normal. No free intraperitoneal gas or fluid. Stable loculated mesenteric fluid collection within the left mid abdomen adjacent to several surgical clips possibly representing a postoperative seroma or chronic hematoma. This measures 2.4 x 4.0 cm at axial image # 54/2. Vascular/Lymphatic: No pathologic adenopathy within the abdomen and pelvis. Aside from gastroepiploic collateral secondary to thrombosis of the splenic vein, the abdominal vasculature is unremarkable. In particular, the superior mesenteric vein and portal vein are patent. Reproductive: Unremarkable Other: The rectum is unremarkable Musculoskeletal: No acute bone abnormality. IMPRESSION: Improving peripancreatic inflammatory changes. Heterogeneous enhancement within the head of the pancreas likely relates to interstitial edema without frank parenchymal necrosis clearly identified. Inflammatory collections are noted within the left upper quadrant the abdomen within the subdiaphragmatic region and perigastric regions adjacent to the gastric fundus at the diaphragmatic hiatus and along the greater curvature extending into the mid body of the pancreas. These appear relatively stable since prior examination. Thrombosis of the splenic vein with numerous gastroepiploic collaterals, unchanged. Stable moderate hepatomegaly.  Stable mild splenomegaly. Electronically Signed   By: Fidela Salisbury MD   On: 11/06/2019 21:09       Assessment / Plan:    #40 34 year old white female with a prolonged course of subacute pancreatitis readmitted after a recent 2-week stay for same. CT imaging this admission shows stable to mildly improved pancreatitis and decreasing size of phlegmon's other than the one small intra pancreatic pseudocyst.  Patient failed outpatient management due to ongoing significant pain nausea and then inability to take p.o.'s.  She has been stable since admission and symptoms  under much better control with regular pain management.  Etiology of continued episodes of pancreatitis is not entirely clear though may have a component of chronic pancreatitis from previous episode stemming from EtOH abuse. Work-up last admission to rule out other etiologies of pancreatitis was negative. She will eventually need EUS when this episode settles down.  She is status post cholecystectomy  #2  Chronic congestive heart failure #3 chronic splenic vein thrombosis #4 history of polyneuropathy #5 asthma #6 prolonged QT  Plan; continue clear liquids today with resource supplements, hydration  Appreciate Dr. Arman Filter efforts to help with pain management she will definitely need analgesics as an outpatient over the next few weeks.  Principal Problem:   Acute on chronic pancreatitis (HCC) Active Problems:   History of alcohol abuse   Pancreatitis   Gastroesophageal reflux disease   Pancreatic pseudocyst/cyst     LOS: 2 days   Amy Esterwood PA-C 11/08/2019, 8:35 AM   Attending physician's note   I have taken an interval history, reviewed the chart and examined the patient. I agree with the Advanced Practitioner's note, impression and recommendations.   34 year old female admitted with subacute pancreatitis  Overall feels better, may be able to go home soon if able to manage pain with oral meds  Continue liquid diet as tolerated and slowly advance  Continue supportive care   K. Denzil Magnuson , MD [phone]

## 2019-11-08 NOTE — Plan of Care (Signed)
  Problem: Education: Goal: Knowledge of General Education information will improve Description: Including pain rating scale, medication(s)/side effects and non-pharmacologic comfort measures Outcome: Progressing   Problem: Health Behavior/Discharge Planning: Goal: Ability to manage health-related needs will improve Outcome: Progressing   Problem: Pain Managment: Goal: General experience of comfort will improve Outcome: Progressing   

## 2019-11-08 NOTE — Progress Notes (Addendum)
PROGRESS NOTE  Kara [name] GHW:[id number] DOB: 1985/10/23 DOA: 11/06/2019 PCP: [name], MD   LOS: 2 days   Brief Narrative / Interim history: 34 year old female with history of recurrent pancreatitis, history of EtOH use but not in the past 3 years, was just discharged on July 14 from hospital for acute on chronic pancreatitis with pseudocyst formation.  She was treated conservatively, improved and then sent home.  Her symptoms gradually returned, and patient came back to the hospital and was admitted on 7/20.  She tells me today that since her discharge she was barely able to eat anything, feels full early, and with food she is having worsening pain as well as nausea.  Subjective / 24h Interval events: Ongoing abdominal pain, tells me that she doesn't get much relief. She is concerned about long term pain management control  Assessment & Plan: Principal Problem Acute on chronic pancreatitis-secondary to EtOH use, however has not used any alcohol in the past 3 years.  CT scan on admission actually showed improved peripancreatic inflammatory changes, along with a pseudocysts which appears relatively stable since prior examination.  Due to persistent symptoms with poor p.o. intake at home and repeat hospitalization gastroenterology has been consulted.  For now continue conservative management, on clear liquid diet, she is somewhat resistant to advance to full liquids as she is afraid to be sick again  -Chronic pain due to chronic pancreatitis can be an issue.  Patient tells me that she had difficulties getting pain medications once she left the hospital and this is the main issue why she returned.  I have called and discussed with Dr. Tenny Craw and given patient's history and the fact that she has not seen him for the past year he would prefer that patient's pain be managed by pain clinic, preferring Dr. Vear Clock.  I have called [facility], and I was told that only PCP or  specialist can refer to pain management, and there is a process in which her case is reviewed with the pain clinic MD and determine if it is appropriate.  I think at this point best the patient see Dr. Tenny Craw as soon as possible after discharge, he is willing to help and work with her, up until she get established with pain clinic  Active Problems Alcohol abuse, in remission-has not had EtOH in past 3 years.  Encouraged ongoing cessation  Chronic diastolic CHF-2D echo couple years ago fairly unremarkable.  Volume status is stable today  Scheduled Meds: . (feeding supplement) PROSource Plus  30 mL Oral TID BM  . buPROPion  150 mg Oral Daily  . buPROPion  300 mg Oral QHS  . enoxaparin (LOVENOX) injection  40 mg Subcutaneous Q24H  . feeding supplement  1 Container Oral TID BM  . gabapentin  900 mg Oral BID  . lipase/protease/amylase  36,000 Units Oral TID AC  . melatonin  10 mg Oral QHS  . metoprolol succinate  50 mg Oral Daily  . multivitamin with minerals  1 tablet Oral Daily  . nicotine  21 mg Transdermal Daily  . potassium chloride  40 mEq Oral Once  . QUEtiapine  100 mg Oral QHS  . rosuvastatin  10 mg Oral Daily  . sertraline  75 mg Oral Daily   Continuous Infusions: . dextrose 5% lactated ringers 125 mL/hr at 11/08/19 0426   PRN Meds:.HYDROmorphone (DILAUDID) injection, hydrOXYzine, ondansetron **OR** ondansetron (ZOFRAN) IV, oxyCODONE  Diet Orders (From admission, onward)    Start  Ordered   11/07/19 1044  Diet clear liquid Room service appropriate? Yes; Fluid consistency: Thin  Diet effective now       Question Answer Comment  Room service appropriate? Yes   Fluid consistency: Thin      11/07/19 1044          DVT prophylaxis: enoxaparin (LOVENOX) injection 40 mg Start: 11/07/19 0800     Code Status: Full Code  Family Communication: no family at bedside   Status is: Inpatient  Remains inpatient appropriate because:Inpatient level of care appropriate due to  severity of illness  Dispo: The patient is from: Home              Anticipated d/c is to: Home              Anticipated d/c date is: 3 days              Patient currently is not medically stable to d/c.  Consultants:  GI  Procedures:  none  Microbiology  none  Antimicrobials: none    Objective: Vitals:   11/07/19 0317 11/07/19 1358 11/07/19 2228 11/08/19 0639  BP: 120/81 120/68 (!) 131/93 110/87  Pulse: (!) 106 97 (!) 110 (!) 108  Resp: 17 16 18 15   Temp: 98.1 F (36.7 C) 98.3 F (36.8 C) 98.4 F (36.9 C) 98.7 F (37.1 C)  TempSrc: Oral Oral Oral Oral  SpO2: 97% 97% 98% 99%  Weight:      Height:        Intake/Output Summary (Last 24 hours) at 11/08/2019 0940 Last data filed at 11/08/2019 0600 Gross per 24 hour  Intake 2504.51 ml  Output 0 ml  Net 2504.51 ml   Filed Weights   11/06/19 1118 11/06/19 1425  Weight: 98.9 kg 94 kg    Examination:  Constitutional: NAD Eyes: No icterus ENMT: Moist mucous membranes Neck: normal, supple Respiratory: Clear bilaterally, no wheezing or crackles Cardiovascular: Regular rate and rhythm, no murmurs Abdomen: Positive bowel sounds Musculoskeletal: no clubbing / cyanosis.  Skin: No rashes Neurologic: Nonfocal, ambulatory  Data Reviewed: I have independently reviewed following labs and imaging studies   CBC: Recent Labs  Lab 11/01/19 1947 11/06/19 1205 11/07/19 0606 11/08/19 0330  WBC 9.0 11.4* 7.0 8.2  HGB 11.1* 12.6 10.3* 11.4*  HCT 35.7* 41.1 34.5* 39.2  MCV 86.9 89.3 91.3 92.2  PLT 546* 468* 316 347   Basic Metabolic Panel: Recent Labs  Lab 11/01/19 1947 11/06/19 1205 11/07/19 0606 11/08/19 0330  NA 138 138  --  138  K 3.8 3.8  --  3.2*  CL 99 97*  --  101  CO2 24 26  --  27  GLUCOSE 107* 130*  --  118*  BUN 5* <5*  --  <5*  CREATININE 0.55 0.63 0.60 0.62  CALCIUM 8.9 9.1  --  8.7*   Liver Function Tests: Recent Labs  Lab 11/01/19 1947 11/06/19 1205 11/08/19 0330  AST 15 15 28   ALT 9  11 12   ALKPHOS 210* 177* 158*  BILITOT 0.4 1.0 0.4  PROT 7.4 7.8 6.9  ALBUMIN 2.8* 3.0* 2.8*   Coagulation Profile: No results for input(s): INR, PROTIME in the last 168 hours. HbA1C: No results for input(s): HGBA1C in the last 72 hours. CBG: No results for input(s): GLUCAP in the last 168 hours.  Recent Results (from the past 240 hour(s))  SARS Coronavirus 2 by RT PCR (hospital order, performed in [facility] hospital lab) Nasopharyngeal Nasopharyngeal Swab  Status: None   Collection Time: 11/06/19  9:46 PM   Specimen: Nasopharyngeal Swab  Result Value Ref Range Status   SARS Coronavirus 2 NEGATIVE NEGATIVE Final    Comment: (NOTE) SARS-CoV-2 target nucleic acids are NOT DETECTED.  The SARS-CoV-2 RNA is generally detectable in upper and lower respiratory specimens during the acute phase of infection. The lowest concentration of SARS-CoV-2 viral copies this assay can detect is 250 copies / mL. A negative result does not preclude SARS-CoV-2 infection and should not be used as the sole basis for treatment or other patient management decisions.  A negative result may occur with improper specimen collection / handling, submission of specimen other than nasopharyngeal swab, presence of viral mutation(s) within the areas targeted by this assay, and inadequate number of viral copies (<250 copies / mL). A negative result must be combined with clinical observations, patient history, and epidemiological information.  Fact Sheet for Patients:   BoilerBrush.com.cy  Fact Sheet for Healthcare Providers: https://pope.com/  This test is not yet approved or  cleared by the [place] FDA and has been authorized for detection and/or diagnosis of SARS-CoV-2 by FDA under an Emergency Use Authorization (EUA).  This EUA will remain in effect (meaning this test can be used) for the duration of the COVID-19 declaration under Section 564(b)(1)  of the Act, 21 U.S.C. section 360bbb-3(b)(1), unless the authorization is terminated or revoked sooner.  Performed at [facility], [address] [place], [place] [address]      Radiology Studies: No results found. Pamella Pert, MD, PhD [facility]  Between 7 am - 7 pm I am available, please contact me via Amion or Securechat  Between 7 pm - 7 am I am not available, please contact night coverage MD/APP via Amion

## 2019-11-09 LAB — CBC
HCT: 35.3 % — ABNORMAL LOW (ref 36.0–46.0)
Hemoglobin: 10.3 g/dL — ABNORMAL LOW (ref 12.0–15.0)
MCH: 27 pg (ref 26.0–34.0)
MCHC: 29.2 g/dL — ABNORMAL LOW (ref 30.0–36.0)
MCV: 92.7 fL (ref 80.0–100.0)
Platelets: 300 10*3/uL (ref 150–400)
RBC: 3.81 MIL/uL — ABNORMAL LOW (ref 3.87–5.11)
RDW: 13.2 % (ref 11.5–15.5)
WBC: 7.3 10*3/uL (ref 4.0–10.5)
nRBC: 0 % (ref 0.0–0.2)

## 2019-11-09 LAB — COMPREHENSIVE METABOLIC PANEL
ALT: 9 U/L (ref 0–44)
AST: 14 U/L — ABNORMAL LOW (ref 15–41)
Albumin: 2.6 g/dL — ABNORMAL LOW (ref 3.5–5.0)
Alkaline Phosphatase: 140 U/L — ABNORMAL HIGH (ref 38–126)
Anion gap: 12 (ref 5–15)
BUN: 5 mg/dL — ABNORMAL LOW (ref 6–20)
CO2: 24 mmol/L (ref 22–32)
Calcium: 8.6 mg/dL — ABNORMAL LOW (ref 8.9–10.3)
Chloride: 102 mmol/L (ref 98–111)
Creatinine, Ser: 0.47 mg/dL (ref 0.44–1.00)
GFR calc Af Amer: >60 mL/min (ref >60)
GFR calc non Af Amer: >60 mL/min (ref >60)
Glucose, Bld: 110 mg/dL — ABNORMAL HIGH (ref 70–99)
Potassium: 3.8 mmol/L (ref 3.5–5.1)
Sodium: 138 mmol/L (ref 135–145)
Total Bilirubin: 0.5 mg/dL (ref 0.3–1.2)
Total Protein: 6.3 g/dL — ABNORMAL LOW (ref 6.5–8.1)

## 2019-11-09 NOTE — Progress Notes (Signed)
PROGRESS NOTE  Kara [name] RCV:[id number] DOB: 10-Jun-1985 DOA: 11/06/2019 PCP: [name], MD   LOS: 3 days   Brief Narrative / Interim history: 34 year old female with history of recurrent pancreatitis, history of EtOH use but not in the past 3 years, was just discharged on July 14 from hospital for acute on chronic pancreatitis with pseudocyst formation.  She was treated conservatively, improved and then sent home.  Her symptoms gradually returned, and patient came back to the hospital and was admitted on 7/20.  She tells me today that since her discharge she was barely able to eat anything, feels full early, and with food she is having worsening pain as well as nausea.  Subjective / 24h Interval events: Her abdominal pain is better this morning, she has not needed that much Dilaudid and feels like oxycodone is working.  She tells me that she is ready to try some full liquids today.  Assessment & Plan: Principal Problem Acute on chronic pancreatitis-secondary to EtOH use, however has not used any alcohol in the past 3 years.  CT scan on admission actually showed improved peripancreatic inflammatory changes, along with a pseudocysts which appears relatively stable since prior examination.  Due to persistent symptoms with poor p.o. intake at home and repeat hospitalization gastroenterology has been consulted and they are following.  For now continue conservative management, on clear liquid diet, she was resistant to advance to full liquid yesterday but willing to do so today.  If she tolerates food may be able to do soft over the weekend and go home.  -Chronic pain due to chronic pancreatitis can be an issue.  Patient tells me that she had difficulties getting pain medications once she left the hospital and this is the main issue why she returned.  I have called and discussed with Dr. Tenny Craw and given patient's history and the fact that she has not seen him for the past year he would prefer  that patient's pain be managed by pain clinic, preferring Dr. Vear Clock.  I have called [facility], and I was told that only PCP or specialist can refer to pain management, and there is a process in which her case is reviewed with the pain clinic MD and determine if it is appropriate.  Discussed again with Dr. Tenny Craw, he will start the referral process and help patient up until she sees pain clinic.  Active Problems Alcohol abuse, in remission-has not had EtOH in past 3 years.  Encouraged ongoing cessation  Chronic diastolic CHF-2D echo couple years ago fairly unremarkable.  Volume status is stable today.  Decrease fluid rate  Scheduled Meds: . (feeding supplement) PROSource Plus  30 mL Oral TID BM  . buPROPion  150 mg Oral Daily  . buPROPion  300 mg Oral QHS  . enoxaparin (LOVENOX) injection  40 mg Subcutaneous Q24H  . feeding supplement  1 Container Oral TID BM  . gabapentin  900 mg Oral BID  . lipase/protease/amylase  36,000 Units Oral TID AC  . melatonin  10 mg Oral QHS  . metoprolol succinate  50 mg Oral Daily  . multivitamin with minerals  1 tablet Oral Daily  . nicotine  21 mg Transdermal Daily  . QUEtiapine  100 mg Oral QHS  . rosuvastatin  10 mg Oral Daily  . sertraline  75 mg Oral Daily   Continuous Infusions: . dextrose 5% lactated ringers 125 mL/hr at 11/08/19 1521   PRN Meds:.HYDROmorphone (DILAUDID) injection, hydrOXYzine, ondansetron **OR** ondansetron (ZOFRAN)  IV, oxyCODONE  Diet Orders (From admission, onward)    Start     Ordered   11/09/19 0847  Diet full liquid Room service appropriate? Yes; Fluid consistency: Thin  Diet effective now       Question Answer Comment  Room service appropriate? Yes   Fluid consistency: Thin      11/09/19 0846          DVT prophylaxis: enoxaparin (LOVENOX) injection 40 mg Start: 11/07/19 0800     Code Status: Full Code  Family Communication: no family at bedside   Status is: Inpatient  Remains  inpatient appropriate because:Inpatient level of care appropriate due to severity of illness  Dispo: The patient is from: Home              Anticipated d/c is to: Home              Anticipated d/c date is: 3 days              Patient currently is not medically stable to d/c.  Consultants:  GI  Procedures:  none  Microbiology  none  Antimicrobials: none    Objective: Vitals:   11/08/19 0639 11/08/19 1816 11/08/19 2128 11/09/19 0510  BP: 110/87 122/82 118/84 106/81  Pulse: (!) 108 (!) 108 102 90  Resp: 15 17 16 14   Temp: 98.7 F (37.1 C)  97.8 F (36.6 C) (!) 97.5 F (36.4 C)  TempSrc: Oral  Oral Oral  SpO2: 99% 99% 100% 98%  Weight:      Height:        Intake/Output Summary (Last 24 hours) at 11/09/2019 1006 Last data filed at 11/09/2019 0600 Gross per 24 hour  Intake 1980 ml  Output --  Net 1980 ml   Filed Weights   11/06/19 1118 11/06/19 1425  Weight: 98.9 kg 94 kg    Examination:  Constitutional: No apparent distress Eyes: No scleral icterus ENMT: Moist mucous membranes Neck: normal, supple Respiratory: CTA biL, no wheezing, no crackles Cardiovascular: Regular rate and rhythm, no murmurs heard Abdomen: Positive bowel sounds Musculoskeletal: no clubbing / cyanosis.  Skin: No rashes appreciated Neurologic: No focal deficits  Data Reviewed: I have independently reviewed following labs and imaging studies   CBC: Recent Labs  Lab 11/06/19 1205 11/07/19 0606 11/08/19 0330 11/09/19 0308  WBC 11.4* 7.0 8.2 7.3  HGB 12.6 10.3* 11.4* 10.3*  HCT 41.1 34.5* 39.2 35.3*  MCV 89.3 91.3 92.2 92.7  PLT 468* 316 347 300   Basic Metabolic Panel: Recent Labs  Lab 11/06/19 1205 11/07/19 0606 11/08/19 0330 11/09/19 0308  NA 138  --  138 138  K 3.8  --  3.2* 3.8  CL 97*  --  101 102  CO2 26  --  27 24  GLUCOSE 130*  --  118* 110*  BUN <5*  --  <5* 5*  CREATININE 0.63 0.60 0.62 0.47  CALCIUM 9.1  --  8.7* 8.6*   Liver Function Tests: Recent Labs   Lab 11/06/19 1205 11/08/19 0330 11/09/19 0308  AST 15 28 14*  ALT 11 12 9   ALKPHOS 177* 158* 140*  BILITOT 1.0 0.4 0.5  PROT 7.8 6.9 6.3*  ALBUMIN 3.0* 2.8* 2.6*   Coagulation Profile: No results for input(s): INR, PROTIME in the last 168 hours. HbA1C: No results for input(s): HGBA1C in the last 72 hours. CBG: No results for input(s): GLUCAP in the last 168 hours.  Recent Results (from the past 240 hour(s))  SARS Coronavirus 2 by RT PCR (hospital order, performed in [facility] hospital lab) Nasopharyngeal Nasopharyngeal Swab     Status: None   Collection Time: 11/06/19  9:46 PM   Specimen: Nasopharyngeal Swab  Result Value Ref Range Status   SARS Coronavirus 2 NEGATIVE NEGATIVE Final    Comment: (NOTE) SARS-CoV-2 target nucleic acids are NOT DETECTED.  The SARS-CoV-2 RNA is generally detectable in upper and lower respiratory specimens during the acute phase of infection. The lowest concentration of SARS-CoV-2 viral copies this assay can detect is 250 copies / mL. A negative result does not preclude SARS-CoV-2 infection and should not be used as the sole basis for treatment or other patient management decisions.  A negative result may occur with improper specimen collection / handling, submission of specimen other than nasopharyngeal swab, presence of viral mutation(s) within the areas targeted by this assay, and inadequate number of viral copies (<250 copies / mL). A negative result must be combined with clinical observations, patient history, and epidemiological information.  Fact Sheet for Patients:   BoilerBrush.com.cy  Fact Sheet for Healthcare Providers: https://pope.com/  This test is not yet approved or  cleared by the [place] FDA and has been authorized for detection and/or diagnosis of SARS-CoV-2 by FDA under an Emergency Use Authorization (EUA).  This EUA will remain in effect (meaning this test can be  used) for the duration of the COVID-19 declaration under Section 564(b)(1) of the Act, 21 U.S.C. section 360bbb-3(b)(1), unless the authorization is terminated or revoked sooner.  Performed at [facility], [address], [place], [place] [address]      Radiology Studies: No results found. Pamella Pert, MD, PhD [facility]  Between 7 am - 7 pm I am available, please contact me via Amion or Securechat  Between 7 pm - 7 am I am not available, please contact night coverage MD/APP via Amion

## 2019-11-09 NOTE — Progress Notes (Addendum)
Patient ID: Kara [name], female   DOB: [birth year], 34 y.o.   MRN: [id number]    Progress Note   Subjective   Day # 3  CC ; subacute pancreatitis  Patient continues with clinical improvement, feeling better today, has been up walking.  Tolerated clear liquids without difficulty no complaints of nausea or vomiting.  Pain is currently well controlled  WBC 7.3, hemoglobin 10.3 Chemistry stable   Review of systems: Denies chest pain dyspnea or dysuria   Objective   Vital signs in last 24 hours: Temp:  [97.5 F (36.4 C)-97.8 F (36.6 C)] 97.5 F (36.4 C) (07/23 0510) Pulse Rate:  [90-108] 90 (07/23 0510) Resp:  [14-17] 14 (07/23 0510) BP: (106-122)/(81-84) 106/81 (07/23 0510) SpO2:  [98 %-100 %] 98 % (07/23 0510) Last BM Date: 11/08/19 General: Young white female in NAD Heart:  Regular rate and rhythm; no murmurs Lungs: Respirations even and unlabored, lungs CTA bilaterally Abdomen:  Soft, , mildly tender epigastrium, no guarding ,nondistended. Normal bowel sounds. Extremities:  Without edema. Neurologic:  Alert and oriented,  grossly normal neurologically. Psych:  Cooperative. Normal mood and affect.  Intake/Output from previous day: 07/22 0701 - 07/23 0700 In: 2340 [P.O.:840; I.V.:1500] Out: -  Intake/Output this shift: No intake/output data recorded.  Still has fluids running at 100 cc an hour  Lab Results: Recent Labs    11/07/19 0606 11/08/19 0330 11/09/19 0308  WBC 7.0 8.2 7.3  HGB 10.3* 11.4* 10.3*  HCT 34.5* 39.2 35.3*  PLT 316 347 300   BMET Recent Labs    11/06/19 1205 11/06/19 1205 11/07/19 0606 11/08/19 0330 11/09/19 0308  NA 138  --   --  138 138  K 3.8  --   --  3.2* 3.8  CL 97*  --   --  101 102  CO2 26  --   --  27 24  GLUCOSE 130*  --   --  118* 110*  BUN <5*  --   --  <5* 5*  CREATININE 0.63   < > 0.60 0.62 0.47  CALCIUM 9.1  --   --  8.7* 8.6*   < > = values in this interval not displayed.   LFT Recent Labs    11/09/19 0308    PROT 6.3*  ALBUMIN 2.6*  AST 14*  ALT 9  ALKPHOS 140*  BILITOT 0.5   PT/INR No results for input(s): LABPROT, INR in the last 72 hours.  Studies/Results: No results found.     Assessment / Plan:    #87 34 year old white female readmitted with prolonged course of subacute pancreatitis. Repeat imaging this admission shows stable to mildly improved pancreatitis and decreasing size of phlegmon's other than the one small intrapancreatic pseudocyst which is slightly increased in size.  Patient failed outpatient management due to ongoing significant pain, nausea, which then resulted in inability to take p.o.'s.  She is currently stable and improving daily, doing much better with regular pain management.  Etiology of the continued episodes of pancreatitis not entirely clear but suspect she does have component of chronic pancreatitis.  She has had multiple prior episodes of pancreatitis in the past related to EtOH, from which she has been abstinent over the past 3 years. Work-up was done last admission to rule out other etiologies of pancreatitis, and negative.  She is status post cholecystectomy  #2 chronic congestive heart failure 3.  Chronic splenic vein thrombosis 4.  History of polyneuropathy 5.  Asthma 6.  Prolonged QT  Plan; agree with diet advancement to full liquids today, continue resource supplements in between meals Continue gradual conversion to oral pain meds. I think patient will be ready for discharge in the next 48 hours.  She has office follow-up at our office next Friday, 11/16/2019 with Hyacinth Meeker, PA-C.  She will keep that appointment. Discussion with patient this morning again regarding her diet.  She does not necessarily need to be consuming solid food on discharge but she needs to be able to take enough full liquids and supplements to maintain good nutrition.  I think she will be able to advance to soft low-fat diet but again suggested she will want to have  several small feedings per day rather than larger meals. I think with continued support and getting her into pain management if she requires narcotics for more than the next couple of weeks, she will continue to improve. We will consider EUS once this acute episode completely subsides. Continue pancreatic enzymes on discharge  GI will be available if needed this weekend.   Principal Problem:   Acute on chronic pancreatitis (HCC) Active Problems:   History of alcohol abuse   Pancreatitis   Gastroesophageal reflux disease   Pancreatic cyst     LOS: 3 days   Amy Esterwood PA-C 11/09/2019, 10:12 AM   I have discussed the case with the PA, and that is the plan I formulated. I personally interviewed and examined the patient.  Patient stable and slowly improving.  Pain under better control, no escalating complications of the pancreatitis. Needs additional protein calorie nutrition, current dietary plan being slowly advanced.  Encouraged intake of liquid protein calorie supplements ad lib.  Greatly appreciate hospitalist assistance communicated with primary care about outpatient pain control.  I also spoke with Dr. Elvera Lennox directly, and IV fluids will be discontinued today.  Total time 25 minutes  Charlie Pitter III Office: [phone]

## 2019-11-10 LAB — BASIC METABOLIC PANEL
Anion gap: 13 (ref 5–15)
BUN: 5 mg/dL — ABNORMAL LOW (ref 6–20)
CO2: 25 mmol/L (ref 22–32)
Calcium: 8.8 mg/dL — ABNORMAL LOW (ref 8.9–10.3)
Chloride: 98 mmol/L (ref 98–111)
Creatinine, Ser: 0.58 mg/dL (ref 0.44–1.00)
GFR calc Af Amer: >60 mL/min (ref >60)
GFR calc non Af Amer: >60 mL/min (ref >60)
Glucose, Bld: 229 mg/dL — ABNORMAL HIGH (ref 70–99)
Potassium: 4.5 mmol/L (ref 3.5–5.1)
Sodium: 136 mmol/L (ref 135–145)

## 2019-11-10 LAB — CBC
HCT: 31.6 % — ABNORMAL LOW (ref 36.0–46.0)
Hemoglobin: 9.5 g/dL — ABNORMAL LOW (ref 12.0–15.0)
MCH: 26.8 pg (ref 26.0–34.0)
MCHC: 30.1 g/dL (ref 30.0–36.0)
MCV: 89.3 fL (ref 80.0–100.0)
Platelets: 308 10*3/uL (ref 150–400)
RBC: 3.54 MIL/uL — ABNORMAL LOW (ref 3.87–5.11)
RDW: 13.2 % (ref 11.5–15.5)
WBC: 7.5 10*3/uL (ref 4.0–10.5)
nRBC: 0 % (ref 0.0–0.2)

## 2019-11-10 MED ORDER — POLYETHYLENE GLYCOL 3350 17 G PO PACK
17.0000 g | PACK | Freq: Every day | ORAL | Status: DC
  Administered 2019-11-10 – 2019-11-11 (×2): 17 g via ORAL
  Filled 2019-11-10 (×2): qty 1

## 2019-11-10 MED ORDER — HYDROMORPHONE HCL 1 MG/ML IJ SOLN
0.5000 mg | INTRAMUSCULAR | Status: DC | PRN
  Administered 2019-11-10 – 2019-11-12 (×12): 0.5 mg via INTRAVENOUS
  Filled 2019-11-10 (×12): qty 0.5

## 2019-11-10 MED ORDER — SODIUM CHLORIDE 0.9 % IV SOLN: INTRAVENOUS | Status: AC

## 2019-11-10 MED ORDER — OXYCODONE HCL 5 MG PO TABS
10.0000 mg | ORAL_TABLET | ORAL | Status: DC | PRN
  Administered 2019-11-10 – 2019-11-11 (×4): 10 mg via ORAL
  Filled 2019-11-10 (×4): qty 2

## 2019-11-10 MED ORDER — SENNOSIDES-DOCUSATE SODIUM 8.6-50 MG PO TABS
1.0000 | ORAL_TABLET | Freq: Two times a day (BID) | ORAL | Status: DC
  Administered 2019-11-10 – 2019-11-11 (×4): 1 via ORAL
  Filled 2019-11-10 (×4): qty 1

## 2019-11-10 NOTE — Plan of Care (Signed)
  Problem: Education: Goal: Knowledge of General Education information will improve Description Including pain rating scale, medication(s)/side effects and non-pharmacologic comfort measures Outcome: Progressing   

## 2019-11-10 NOTE — Progress Notes (Signed)
PROGRESS NOTE  Kara [name] WVP:[id number] DOB: Nov 28, 1985 DOA: 11/06/2019 PCP: [name], MD   LOS: 4 days   Brief Narrative / Interim history: 34 year old female with history of alcohol use, recurrent pancreatitis, quit EtOH 3 years ago, was just discharged on July 14 from hospital for acute on chronic pancreatitis with pseudocyst formation.  She was treated conservatively, improved and then sent home.  Her symptoms gradually returned, and patient came back to the hospital and was admitted on 7/20.  She reported that since her discharge she was barely able to eat anything, feels full early, and with food she is having worsening pain as well as nausea.  Subjective / 24h Interval events: -Continues to report abdominal pain, mild improvement noted, ongoing nausea  Assessment & Plan:   Acute on chronic pancreatitis-secondary to EtOH use, however has not used any alcohol in the past 3 years.  CT scan on admission actually showed improved peripancreatic inflammatory changes, along with a pseudocysts which appears relatively stable since prior examination.   -Gastroenterology following  -Continue supportive care, advance liquid diet as tolerated  -Continue pancreatic supplements  -GI plans to consider EUS once acute episode has completely subsided only  Chronic pain due to chronic pancreatitis can be an issue.  Patient reported difficulties getting pain medications once she left the hospital and this is the main issue why she returned to the ED.  Dr.Gherghe called and discussed with Dr. Tenny Craw and given patient's history and the fact that she has not seen him for the past year he would prefer that patient's pain be managed by pain clinic, preferring Dr. Vear Clock.  He then called [facility] [facility], and  was told that only PCP or specialist can refer to pain management, and there is a process in which her case is reviewed with the pain clinic MD and determine if it is appropriate.   Discussed again with Dr. Tenny Craw, he will start the referral process and help patient up until she sees pain clinic.  Alcohol abuse, in remission-has not had EtOH in past 3 years.  Encouraged ongoing cessation  Chronic diastolic CHF-2D echo couple years ago fairly unremarkable.   -Volume status is stable   DVT prophylaxis: enoxaparin (LOVENOX) injection 40 mg Start: 11/07/19 0800 Code Status: Full Code Family Communication: no family at bedside   Status is: Inpatient  Remains inpatient appropriate because:Inpatient level of care appropriate due to severity of illness  Dispo: The patient is from: Home              Anticipated d/c is to: Home              Anticipated d/c date is: 1-2days               Patient currently is not medically stable to d/c.  Consultants:  GI  Procedures:  none  Scheduled Meds: . (feeding supplement) PROSource Plus  30 mL Oral TID BM  . buPROPion  150 mg Oral Daily  . buPROPion  300 mg Oral QHS  . enoxaparin (LOVENOX) injection  40 mg Subcutaneous Q24H  . feeding supplement  1 Container Oral TID BM  . gabapentin  900 mg Oral BID  . lipase/protease/amylase  36,000 Units Oral TID AC  . melatonin  10 mg Oral QHS  . metoprolol succinate  50 mg Oral Daily  . multivitamin with minerals  1 tablet Oral Daily  . nicotine  21 mg Transdermal Daily  . polyethylene glycol  17 g  Oral Daily  . QUEtiapine  100 mg Oral QHS  . rosuvastatin  10 mg Oral Daily  . senna-docusate  1 tablet Oral BID  . sertraline  75 mg Oral Daily   Continuous Infusions: . sodium chloride 50 mL/hr at 11/10/19 0947   PRN Meds:.HYDROmorphone (DILAUDID) injection, hydrOXYzine, ondansetron **OR** ondansetron (ZOFRAN) IV, oxyCODONE  Diet Orders (From admission, onward)    Start     Ordered   11/10/19 0945  DIET SOFT Room service appropriate? Yes; Fluid consistency: Thin  Diet effective now       Question Answer Comment  Room service appropriate? Yes   Fluid consistency: Thin       11/10/19 0945          Microbiology  none  Antimicrobials: none    Objective: Vitals:   11/09/19 0510 11/09/19 1331 11/09/19 2145 11/10/19 0630  BP: 106/81 95/74 122/79 109/70  Pulse: 90 89 103 90  Resp: 14 18 16 15   Temp: (!) 97.5 F (36.4 C) 98.3 F (36.8 C) 98.7 F (37.1 C) 98.7 F (37.1 C)  TempSrc: Oral   Oral  SpO2: 98% 97% 97%   Weight:      Height:        Intake/Output Summary (Last 24 hours) at 11/10/2019 1318 Last data filed at 11/10/2019 1100 Gross per 24 hour  Intake 2305.44 ml  Output --  Net 2305.44 ml   Filed Weights   11/06/19 1118 11/06/19 1425  Weight: 98.9 kg 94 kg    Examination:  Obese pleasant female sitting in bed, AAOx3, uncomfortable appearing HEENT: No JVD or icterus CVS: S1-S2, regular rate rhythm Lungs: Distant breath sounds, overall clear Abdomen is obese, soft, epigastric tenderness noted, bowel sounds diminished Extremities: No edema Skin: No rashes appreciated on exposed skin Neurologic: No focal deficits  Data Reviewed: I have independently reviewed following labs and imaging studies   CBC: Recent Labs  Lab 11/06/19 1205 11/07/19 0606 11/08/19 0330 11/09/19 0308 11/10/19 0712  WBC 11.4* 7.0 8.2 7.3 7.5  HGB 12.6 10.3* 11.4* 10.3* 9.5*  HCT 41.1 34.5* 39.2 35.3* 31.6*  MCV 89.3 91.3 92.2 92.7 89.3  PLT 468* 316 347 300 308   Basic Metabolic Panel: Recent Labs  Lab 11/06/19 1205 11/07/19 0606 11/08/19 0330 11/09/19 0308 11/10/19 0308  NA 138  --  138 138 136  K 3.8  --  3.2* 3.8 4.5  CL 97*  --  101 102 98  CO2 26  --  27 24 25   GLUCOSE 130*  --  118* 110* 229*  BUN <5*  --  <5* 5* 5*  CREATININE 0.63 0.60 0.62 0.47 0.58  CALCIUM 9.1  --  8.7* 8.6* 8.8*   Liver Function Tests: Recent Labs  Lab 11/06/19 1205 11/08/19 0330 11/09/19 0308  AST 15 28 14*  ALT 11 12 9   ALKPHOS 177* 158* 140*  BILITOT 1.0 0.4 0.5  PROT 7.8 6.9 6.3*  ALBUMIN 3.0* 2.8* 2.6*   Coagulation Profile: No results for  input(s): INR, PROTIME in the last 168 hours. HbA1C: No results for input(s): HGBA1C in the last 72 hours. CBG: No results for input(s): GLUCAP in the last 168 hours.  Recent Results (from the past 240 hour(s))  SARS Coronavirus 2 by RT PCR (hospital order, performed in [facility] hospital lab) Nasopharyngeal Nasopharyngeal Swab     Status: None   Collection Time: 11/06/19  9:46 PM   Specimen: Nasopharyngeal Swab  Result Value Ref Range Status  SARS Coronavirus 2 NEGATIVE NEGATIVE Final    Comment: (NOTE) SARS-CoV-2 target nucleic acids are NOT DETECTED.  The SARS-CoV-2 RNA is generally detectable in upper and lower respiratory specimens during the acute phase of infection. The lowest concentration of SARS-CoV-2 viral copies this assay can detect is 250 copies / mL. A negative result does not preclude SARS-CoV-2 infection and should not be used as the sole basis for treatment or other patient management decisions.  A negative result may occur with improper specimen collection / handling, submission of specimen other than nasopharyngeal swab, presence of viral mutation(s) within the areas targeted by this assay, and inadequate number of viral copies (<250 copies / mL). A negative result must be combined with clinical observations, patient history, and epidemiological information.  Fact Sheet for Patients:   BoilerBrush.com.cy  Fact Sheet for Healthcare Providers: https://pope.com/  This test is not yet approved or  cleared by the [place] FDA and has been authorized for detection and/or diagnosis of SARS-CoV-2 by FDA under an Emergency Use Authorization (EUA).  This EUA will remain in effect (meaning this test can be used) for the duration of the COVID-19 declaration under Section 564(b)(1) of the Act, 21 U.S.C. section 360bbb-3(b)(1), unless the authorization is terminated or revoked sooner.  Performed at [facility], [address] [place], [place] [address]      Radiology Studies: No results found.  Zannie Cove MD [facility]

## 2019-11-11 LAB — COMPREHENSIVE METABOLIC PANEL
ALT: 8 U/L (ref 0–44)
AST: 12 U/L — ABNORMAL LOW (ref 15–41)
Albumin: 2.5 g/dL — ABNORMAL LOW (ref 3.5–5.0)
Alkaline Phosphatase: 138 U/L — ABNORMAL HIGH (ref 38–126)
Anion gap: 10 (ref 5–15)
BUN: 6 mg/dL (ref 6–20)
CO2: 28 mmol/L (ref 22–32)
Calcium: 8.7 mg/dL — ABNORMAL LOW (ref 8.9–10.3)
Chloride: 101 mmol/L (ref 98–111)
Creatinine, Ser: 0.51 mg/dL (ref 0.44–1.00)
GFR calc Af Amer: >60 mL/min (ref >60)
GFR calc non Af Amer: >60 mL/min (ref >60)
Glucose, Bld: 105 mg/dL — ABNORMAL HIGH (ref 70–99)
Potassium: 3.7 mmol/L (ref 3.5–5.1)
Sodium: 139 mmol/L (ref 135–145)
Total Bilirubin: 0.5 mg/dL (ref 0.3–1.2)
Total Protein: 6.4 g/dL — ABNORMAL LOW (ref 6.5–8.1)

## 2019-11-11 MED ORDER — ALBUTEROL SULFATE HFA 108 (90 BASE) MCG/ACT IN AERS: 1.0000 | INHALATION_SPRAY | Freq: Once | RESPIRATORY_TRACT | Status: DC

## 2019-11-11 MED ORDER — OXYCODONE HCL 5 MG PO TABS
15.0000 mg | ORAL_TABLET | ORAL | Status: DC | PRN
  Administered 2019-11-11 – 2019-11-12 (×6): 15 mg via ORAL
  Filled 2019-11-11 (×6): qty 3

## 2019-11-11 MED ORDER — ALBUTEROL SULFATE (2.5 MG/3ML) 0.083% IN NEBU
2.5000 mg | INHALATION_SOLUTION | Freq: Once | RESPIRATORY_TRACT | Status: AC
  Administered 2019-11-11: 2.5 mg via RESPIRATORY_TRACT
  Filled 2019-11-11: qty 3

## 2019-11-11 NOTE — Progress Notes (Signed)
Patient's most recent BP: 99/69. Oral fluids encouraged for hydration. This shift, educated patient on becoming accustomed to oral pain regimen (that there will not be an IV when she is discharged). However, patient's pain has stayed at 7/10. Patient has continued to need IV Dilaudid for breakthrough pain.

## 2019-11-11 NOTE — Plan of Care (Signed)
  Problem: Pain Managment: Goal: General experience of comfort will improve Outcome: Not Progressing   

## 2019-11-11 NOTE — Progress Notes (Signed)
Pt did well overall today with soft diet though pain did increase with eating. Pt ambulated in hall several times without complication. Pt medicated for pain as needed throughout day.

## 2019-11-11 NOTE — Progress Notes (Signed)
PROGRESS NOTE  Kara [name] UXN:[id number] DOB: 19-Jan-1986 DOA: 11/06/2019 PCP: [name], MD   LOS: 5 days   Brief Narrative / Interim history: 34 year old female with history of heavy alcohol use, recurrent pancreatitis, quit EtOH 3 years ago, was just discharged on July 14 from hospital for acute on chronic pancreatitis with pseudocyst formation.  She was treated conservatively, improved and then sent home.  Her symptoms gradually returned, and patient came back to the hospital and was admitted on 7/20.  She reported that since her discharge she was barely able to eat anything, feels full early, and with food she is having worsening pain as well as nausea.  Subjective / 24h Interval events: -Extremely upset that I decreased her Dilaudid dose yesterday, reports that she continues to have moderate to severe pain, tolerating soft diet, persistent nausea, no vomiting -Per nursing staff requiring frequent administration of IV Dilaudid and oral oxycodone  Assessment & Plan:   Acute on chronic pancreatitis-secondary to EtOH use, however has not used any alcohol in the past 3 years.  CT scan on admission actually showed improved peripancreatic inflammatory changes, along with a pseudocysts which appears relatively stable since prior examination.   -Gastroenterology following  -Continue supportive care, tolerating soft diet -Pain control continues to be complicated, continues to be fixated on frequency and dose of IV and oral narcotics -Attempt to wean down IV Dilaudid, continue oxycodone dose increased to 15mg  q4PRN -Continue pancreatic supplements  -GI plans to consider EUS once acute episode has completely subsided only  Chronic pain due to chronic pancreatitis can be an issue.  Patient reported difficulties getting pain medications once she left the hospital and this is the main issue why she returned to the ED.  Dr.Gherghe called and discussed with Dr. and given patient's history  and the fact that she has not seen him for the past year he would prefer that patient's pain be managed by pain clinic, preferring Dr. Tenny Craw.  He then called [facility], and  was told that only PCP or specialist can refer to pain management, discussed again with Dr. Vear Clock, he will start the referral process and help patient up until she sees pain clinic.  Alcohol abuse, in remission-has not had EtOH in past 3 years.  Encouraged ongoing cessation  Chronic diastolic CHF-2D echo couple years ago fairly unremarkable.   -Volume status is stable  Obesity BMI 34.4  DVT prophylaxis: enoxaparin (LOVENOX)  Code Status: Full Code Family Communication: no family at bedside   Status is: Inpatient  Remains inpatient appropriate because:Inpatient level of care appropriate due to severity of illness, pain control  Dispo: The patient is from: Home              Anticipated d/c is to: Home              Anticipated d/c date is: 1-2days               Patient currently is not medically stable to d/c.  Consultants:  GI  Procedures:  none  Scheduled Meds: . (feeding supplement) PROSource Plus  30 mL Oral TID BM  . buPROPion  150 mg Oral Daily  . buPROPion  300 mg Oral QHS  . enoxaparin (LOVENOX) injection  40 mg Subcutaneous Q24H  . feeding supplement  1 Container Oral TID BM  . gabapentin  900 mg Oral BID  . lipase/protease/amylase  36,000 Units Oral TID AC  . melatonin  10 mg Oral  QHS  . metoprolol succinate  50 mg Oral Daily  . multivitamin with minerals  1 tablet Oral Daily  . nicotine  21 mg Transdermal Daily  . polyethylene glycol  17 g Oral Daily  . QUEtiapine  100 mg Oral QHS  . rosuvastatin  10 mg Oral Daily  . senna-docusate  1 tablet Oral BID  . sertraline  75 mg Oral Daily   Continuous Infusions:  PRN Meds:.HYDROmorphone (DILAUDID) injection, hydrOXYzine, ondansetron **OR** ondansetron (ZOFRAN) IV, oxyCODONE  Diet Orders (From admission, onward)    Start      Ordered   11/10/19 0945  DIET SOFT Room service appropriate? Yes; Fluid consistency: Thin  Diet effective now       Question Answer Comment  Room service appropriate? Yes   Fluid consistency: Thin      11/10/19 0945          Microbiology  none  Antimicrobials: none    Objective: Vitals:   11/10/19 2041 11/11/19 0248 11/11/19 0559 11/11/19 0634  BP: 101/75  99/69 100/69  Pulse: 90  77 93  Resp: 16  14   Temp: 98.2 F (36.8 C)  98.7 F (37.1 C)   TempSrc: Oral  Oral   SpO2: 97% 93%    Weight:      Height:        Intake/Output Summary (Last 24 hours) at 11/11/2019 1254 Last data filed at 11/11/2019 1110 Gross per 24 hour  Intake 1924.48 ml  Output --  Net 1924.48 ml   Filed Weights   11/06/19 1118 11/06/19 1425  Weight: 98.9 kg 94 kg    Examination:  Obese female sitting up in bed, uncomfortable appearing  HEENT: No JVD, no icterus CVS: S1-S2, regular rate and rhythm Lungs: Distant breath sounds otherwise clear Abdomen: Soft, obese, mild epigastric tenderness, bowel sounds diminished  Extremities: No edema Skin: No rashes appreciated on exposed skin Neurologic: No focal deficits  Data Reviewed: I have independently reviewed following labs and imaging studies   CBC: Recent Labs  Lab 11/06/19 1205 11/07/19 0606 11/08/19 0330 11/09/19 0308 11/10/19 0712  WBC 11.4* 7.0 8.2 7.3 7.5  HGB 12.6 10.3* 11.4* 10.3* 9.5*  HCT 41.1 34.5* 39.2 35.3* 31.6*  MCV 89.3 91.3 92.2 92.7 89.3  PLT 468* 316 347 300 308   Basic Metabolic Panel: Recent Labs  Lab 11/06/19 1205 11/06/19 1205 11/07/19 0606 11/08/19 0330 11/09/19 0308 11/10/19 0308 11/11/19 0337  NA 138  --   --  138 138 136 139  K 3.8  --   --  3.2* 3.8 4.5 3.7  CL 97*  --   --  101 102 98 101  CO2 26  --   --  27 24 25 28   GLUCOSE 130*  --   --  118* 110* 229* 105*  BUN <5*  --   --  <5* 5* 5* 6  CREATININE 0.63   < > 0.60 0.62 0.47 0.58 0.51  CALCIUM 9.1  --   --  8.7* 8.6* 8.8* 8.7*    < > = values in this interval not displayed.   Liver Function Tests: Recent Labs  Lab 11/06/19 1205 11/08/19 0330 11/09/19 0308 11/11/19 0337  AST 15 28 14* 12*  ALT 11 12 9 8   ALKPHOS 177* 158* 140* 138*  BILITOT 1.0 0.4 0.5 0.5  PROT 7.8 6.9 6.3* 6.4*  ALBUMIN 3.0* 2.8* 2.6* 2.5*   Coagulation Profile: No results for input(s): INR, PROTIME in the last  168 hours. HbA1C: No results for input(s): HGBA1C in the last 72 hours. CBG: No results for input(s): GLUCAP in the last 168 hours.  Recent Results (from the past 240 hour(s))  SARS Coronavirus 2 by RT PCR (hospital order, performed in [facility] hospital lab) Nasopharyngeal Nasopharyngeal Swab     Status: None   Collection Time: 11/06/19  9:46 PM   Specimen: Nasopharyngeal Swab  Result Value Ref Range Status   SARS Coronavirus 2 NEGATIVE NEGATIVE Final    Comment: (NOTE) SARS-CoV-2 target nucleic acids are NOT DETECTED.  The SARS-CoV-2 RNA is generally detectable in upper and lower respiratory specimens during the acute phase of infection. The lowest concentration of SARS-CoV-2 viral copies this assay can detect is 250 copies / mL. A negative result does not preclude SARS-CoV-2 infection and should not be used as the sole basis for treatment or other patient management decisions.  A negative result may occur with improper specimen collection / handling, submission of specimen other than nasopharyngeal swab, presence of viral mutation(s) within the areas targeted by this assay, and inadequate number of viral copies (<250 copies / mL). A negative result must be combined with clinical observations, patient history, and epidemiological information.  Fact Sheet for Patients:   BoilerBrush.com.cy  Fact Sheet for Healthcare Providers: https://pope.com/  This test is not yet approved or  cleared by the [place] FDA and has been authorized for detection and/or diagnosis of  SARS-CoV-2 by FDA under an Emergency Use Authorization (EUA).  This EUA will remain in effect (meaning this test can be used) for the duration of the COVID-19 declaration under Section 564(b)(1) of the Act, 21 U.S.C. section 360bbb-3(b)(1), unless the authorization is terminated or revoked sooner.  Performed at [facility], [address], [place], [place] [address]      Radiology Studies: No results found.  Zannie Cove MD [facility]

## 2019-11-11 NOTE — Plan of Care (Signed)
  Problem: Clinical Measurements: Goal: Respiratory complications will improve Outcome: Progressing   Problem: Clinical Measurements: Goal: Cardiovascular complication will be avoided Outcome: Progressing   Problem: Coping: Goal: Level of anxiety will decrease Outcome: Progressing   Problem: Pain Managment: Goal: General experience of comfort will improve Outcome: Progressing   Problem: Skin Integrity: Goal: Risk for impaired skin integrity will decrease Outcome: Progressing   

## 2019-11-12 LAB — COMPREHENSIVE METABOLIC PANEL
ALT: 8 U/L (ref 0–44)
AST: 15 U/L (ref 15–41)
Albumin: 2.7 g/dL — ABNORMAL LOW (ref 3.5–5.0)
Alkaline Phosphatase: 162 U/L — ABNORMAL HIGH (ref 38–126)
Anion gap: 10 (ref 5–15)
BUN: <5 mg/dL — ABNORMAL LOW (ref 6–20)
CO2: 27 mmol/L (ref 22–32)
Calcium: 8.6 mg/dL — ABNORMAL LOW (ref 8.9–10.3)
Chloride: 95 mmol/L — ABNORMAL LOW (ref 98–111)
Creatinine, Ser: 0.61 mg/dL (ref 0.44–1.00)
GFR calc Af Amer: >60 mL/min (ref >60)
GFR calc non Af Amer: >60 mL/min (ref >60)
Glucose, Bld: 105 mg/dL — ABNORMAL HIGH (ref 70–99)
Potassium: 3.4 mmol/L — ABNORMAL LOW (ref 3.5–5.1)
Sodium: 132 mmol/L — ABNORMAL LOW (ref 135–145)
Total Bilirubin: 0.3 mg/dL (ref 0.3–1.2)
Total Protein: 6.7 g/dL (ref 6.5–8.1)

## 2019-11-12 LAB — CBC
HCT: 32 % — ABNORMAL LOW (ref 36.0–46.0)
Hemoglobin: 9.4 g/dL — ABNORMAL LOW (ref 12.0–15.0)
MCH: 27.3 pg (ref 26.0–34.0)
MCHC: 29.4 g/dL — ABNORMAL LOW (ref 30.0–36.0)
MCV: 93 fL (ref 80.0–100.0)
Platelets: 356 10*3/uL (ref 150–400)
RBC: 3.44 MIL/uL — ABNORMAL LOW (ref 3.87–5.11)
RDW: 13.4 % (ref 11.5–15.5)
WBC: 6.9 10*3/uL (ref 4.0–10.5)
nRBC: 0 % (ref 0.0–0.2)

## 2019-11-12 MED ORDER — POLYETHYLENE GLYCOL 3350 17 G PO PACK: 17.0000 g | PACK | Freq: Two times a day (BID) | ORAL | Status: DC

## 2019-11-12 MED ORDER — ENSURE ENLIVE PO LIQD
237.0000 mL | Freq: Three times a day (TID) | ORAL | Status: DC
  Administered 2019-11-12: 237 mL via ORAL

## 2019-11-12 MED ORDER — POLYETHYLENE GLYCOL 3350 17 G PO PACK: 17.0000 g | PACK | Freq: Two times a day (BID) | ORAL | 1 refills | Status: DC

## 2019-11-12 MED ORDER — OXYCODONE HCL 15 MG PO TABS: 15.0000 mg | ORAL_TABLET | ORAL | 0 refills | Status: DC | PRN

## 2019-11-12 MED ORDER — ONDANSETRON 8 MG PO TBDP
8.0000 mg | ORAL_TABLET | Freq: Three times a day (TID) | ORAL | 0 refills | Status: DC | PRN
Start: 2019-11-12 — End: 2020-08-05

## 2019-11-12 NOTE — Progress Notes (Addendum)
Progress Note  CC:  Pancreatitis   ASSESSMENT AND PLAN:   # Recurrent pancreatitis with phlegmons  --Etiology has been unclear including autoimmune evaluation. Was on lasix, this has been stopped due to concern for being potential culprit. No radiologic evidence for chronic pancreatis.  --May need EUS at some point.  --recent prolonged admission for same. Returned to hospital for ongoing pain / N/V after running out of pain meds at home.  --Repeat CT scan this admission shows stable to mildly improved pancreatitis and decreasing size of phlegmons with exception of the small intrapancreatic pseudocyst which has increased slightly in size but is still only 1.5 x 2.0 cm. 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  --VSS, WBC 6.9. Feels okay with pain meds. Tolerating soft diet. Says she is supposed to be going home today. She already has a follow up scheduled with [place] on Friday. Hopefully able to manage at home in the interim.  --She will need ample amount of pain medication upon discharge to get her through until seen in office. I don't know if she has a PCP but a TCA or SSRI should be considered to help with pain control.  --Low fat diet upon discharge.    # Fatty liver disease.  --Long term will need weight loss.  --Consider outpatient Fibrosure and elastography   SUBJECTIVE    Feels okay, had [place] toast and eggs this am.    OBJECTIVE:     Vital signs in last 24 hours: Temp:  [98.2 F (36.8 C)-98.3 F (36.8 C)] 98.2 F (36.8 C) (07/26 0432) Pulse Rate:  [89-95] 95 (07/26 0432) Resp:  [16-18] 16 (07/26 0432) BP: (95-108)/(63-86) 95/63 (07/26 0432) SpO2:  [98 %-100 %] 98 % (07/26 0432) Last BM Date: 11/12/19 General:   Alert, in NAD Heart:  Regular rate and rhythm.  No lower extremity edema   Pulm: Normal respiratory effort   Abdomen:  Soft,  nontender, nondistended.  Normal bowel sounds.          Neurologic:  Alert and  oriented,  grossly normal  neurologically. Psych:  Pleasant, cooperative.  Normal mood and affect.   Intake/Output from previous day: 07/25 0701 - 07/26 0700 In: 1787.5 [P.O.:1410; I.V.:377.5] Out: -  Intake/Output this shift: Total I/O In: 20 [I.V.:20] Out: -   Lab Results: Recent Labs    11/10/19 0712 11/12/19 0435  WBC 7.5 6.9  HGB 9.5* 9.4*  HCT 31.6* 32.0*  PLT 308 356   BMET Recent Labs    11/10/19 0308 11/11/19 0337 11/12/19 0435  NA 136 139 132*  K 4.5 3.7 3.4*  CL 98 101 95*  CO2 25 28 27   GLUCOSE 229* 105* 105*  BUN 5* 6 <5*  CREATININE 0.58 0.51 0.61  CALCIUM 8.8* 8.7* 8.6*   LFT Recent Labs    11/12/19 0435  PROT 6.7  ALBUMIN 2.7*  AST 15  ALT 8  ALKPHOS 162*  BILITOT 0.3      LOS: 6 days   11/14/19 ,NP 11/12/2019, 9:29 AM    ________________________________________________________________________  11/14/2019 GI MD note:  I personally examined the patient, reviewed the data and agree with the assessment and plan described above. This is the first time I am meeting her but she looks quite well, dressed in street clothes working on her laptop.  She is tolerating soft diet (french toast and eggs this AM), ambulating in the halls. Her pain is well controlled.  Certainly safe for discharge later  today. She has an appt in our office on Friday and knows to keep that appointment.  Etiology of her pancreatitis is probably 'acute on chronic' inflammation related to previous heavy etoh abuse.  Her GB was removed over a year ago and no residual stones on MR last month.   I think repeat cross sectional imaging (best with MR) should wait 2-3 months as long as she is doing well clinically to allow acute inflammation to subside as much as possible.  Please call or page with any further questions or concerns.    Rob Bunting, MD [facility] Pager [phone]

## 2019-11-12 NOTE — Progress Notes (Signed)
Patient discharged to home w/ family. Given all belongings, instructions, work Physicist, medical. Patient requested iv dilaudid prior to d/c. Educated patient that iv medications not appropriate prior to d/c. Patient insisted that she can control her pain at home w/ po oxycodone but the dilaudid will help control pain during the drive home and stopping at pharmacy to pick up prescriptions. Patient verbalized understanding of all instructions. Declined w/c to pov, elected to ambulate.

## 2019-11-12 NOTE — Progress Notes (Signed)
Nutrition Follow-up  DOCUMENTATION CODES:   Non-severe (moderate) malnutrition in context of acute illness/injury, Obesity unspecified  INTERVENTION:   D/c Boost Breeze po TID, each supplement provides 250 kcal and 9 grams of protein  Ensure Enlive po TID, each supplement provides 350 kcal and 20 grams of protein  Continue 75ml Prosource Plus po TID, each supplement provides 100 kcal and 15 grams of protein  Continue MVI daily   NUTRITION DIAGNOSIS:   Moderate Malnutrition related to acute illness (persistent pancreatitis) as evidenced by energy intake < 75% for > or equal to 1 month, mild fat depletion, mild muscle depletion.  Ongoing.  GOAL:   Patient will meet greater than or equal to 90% of their needs  Progressing.   MONITOR:   PO intake, Supplement acceptance, Diet advancement, Labs, Weight trends  REASON FOR ASSESSMENT:   Malnutrition Screening Tool    ASSESSMENT:   34 year old female with medical history of recurrent pancreatitis, hx of EtOH abuse (sober x3 years). She was discharged 7/14 after admission for acute on chronic pancreatitis with pseudocyst formation. She was treated conservatively and was able to discharge home. Her symptoms gradually returned and she presented to the ED on 7/20.  7/21 - diet advanced to clear liquids 7/23 - diet advanced to full liquids 7/26 - diet advanced to soft  Discussed pt with RN.   Pt reporting that she is doing fairly well with tolerating the soft diet, but does endorse increased pain with eating. Pt still C/O persistent nausea, but denies vomiting.  Pt currently receiving Boost Breeze po TID and 89ml Prosource Plus TID and is consuming 100% of her supplements. Will d/c Boost Breeze and order Ensure Enlive to provide more calories and protein.   PO Intake: 100% x4 recorded meals since being advanced to soft diet  Labs: Na 132(L), K+ 3.4 (L) Medications: Creon, MVI, Miralax, Senokot-S  Diet Order:   Diet Order             DIET SOFT Room service appropriate? Yes; Fluid consistency: Thin  Diet effective now                 EDUCATION NEEDS:   Not appropriate for education at this time  Skin:  Skin Assessment: Reviewed RN Assessment  Last BM:  7/26  Height:   Ht Readings from Last 1 Encounters:  11/06/19 5\' 5"  (1.651 m)    Weight:   Wt Readings from Last 1 Encounters:  11/06/19 94 kg   BMI:  Body mass index is 34.47 kg/m.  Estimated Nutritional Needs:   Kcal:  2250-2500 kcal  Protein:  110-125 grams  Fluid:  >/= 2.2 L/day    11/08/19, MS, RD, LDN RD pager number and weekend/on-call pager number located in Amion.

## 2019-11-12 NOTE — Discharge Summary (Signed)
Physician Discharge Summary  Kara [name] WJX:[id number] DOB: 1986-03-27 DOA: 11/06/2019  PCP: [name], MD  Admit date: 11/06/2019 Discharge date: 11/12/2019  Time spent: 35 minutes  Recommendations for Outpatient Follow-up:  PCP Dr. Tenny Craw in 1 week [place] [facility] on Friday 7/30  Discharge Diagnoses:  Principal Problem:   Acute on chronic pancreatitis [facility]) Pancreatic pseudocyst History of alcohol abuse GERD Obesity Chronic diastolic CHF  Discharge Condition: Stable  Diet recommendation: Low-sodium, low-fat diet  Filed Weights   11/06/19 1118 11/06/19 1425  Weight: 98.9 kg 94 kg    History of present illness:  34 year old female with history of heavy alcohol use, recurrent pancreatitis, quit EtOH 3 years ago, was just discharged on July 14 from hospital for acute on chronic pancreatitis with pseudocyst formation.  She was treated conservatively, improved and then sent home.  Her symptoms gradually returned, and patient came back to the hospital and was admitted on 7/20.  She reported that since her discharge she was barely able to eat anything, feels full early, and with food she is having worsening pain as well as nausea.  Hospital Course:   Acute on chronic pancreatitis-secondary to EtOH use, however has not used any alcohol in the past 3 years.  CT scan on admission actually showed improved peripancreatic inflammatory changes, along with a pseudocysts which appears relatively stable since prior examination.   -Previously underwent cholecystectomy over a year ago and now residual gallstones noted in her bile ducts on MRI last month -Gastroenterology consulted -Treated with supportive care and pain control  -Pain management remained complicated, currently somewhat managed on oxycodone 15 mg every 4 hours as needed and small doses of IV Dilaudid as needed  -Chronic pain from her pseudocyst/chronic pancreatitis can be an issue, patient reported difficulties  obtaining pain medicines when she left the hospital over a week ago for same  -Dr.Gherghe called and discussed with her PCP Dr. Tenny Craw and given patient's history and the fact that she has not seen him for the past year he would prefer that patient's pain be managed by pain clinic, preferring Dr. Vear Clock. He then called [place] [facility], and  was told that only PCP or specialist can refer to pain management, discussed again with Dr. Tenny Craw, he will start the referral process and help patient with narcotics until she sees pain clinic. -She is discharged home on oxycodone 15 mg every 4 hours as needed, 60 total pills which should last her at least 10 days, along with zofran and miralax -Patient educated that she will not likely need long-term narcotics for this -FU with PCP Dr. Tenny Craw in 1 week and she also has a follow-up appointment with [facility] this Friday -Plan to consider either repeat MRI or EUS down the road  Alcohol abuse, in remission-has not had EtOH in past 3 years.  Encouraged ongoing cessation  Chronic diastolic CHF-2D echo couple years ago fairly unremarkable.   -Volume status is stable  Obesity BMI 34.4  DVT prophylaxis: enoxaparin (LOVENOX)  Code Status: Full Code Family Communication: no family at bedside   Status is: Inpatient  Remains inpatient appropriate because:Inpatient level of care appropriate due to severity of illness, pain control  Dispo: The patient is from: Home  Anticipated d/c is to: Home  Anticipated d/c date is: 1-2days   Patient currently is not medically stable to d/c.  Consultants:  GI  Procedures:   Discharge Exam: Vitals:   11/11/19 2123 11/12/19 0432  BP: 102/77 (!) 95/63  Pulse: 89 95  Resp: 18 16  Temp: 98.3 F (36.8 C) 98.2 F (36.8 C)  SpO2: 99% 98%    General: AAOx3, no distress Cardiovascular: S1S2/RRR Respiratory: CTAB  Discharge  Instructions    Allergies as of 11/12/2019      Reactions   Nsaids Other (See Comments)   Upset stomach    Peanuts [peanut Oil] Hives, Itching   "ONLY TREE NUTS"      Medication List    STOP taking these medications   furosemide 20 MG tablet Commonly known as: LASIX   metoCLOPramide 10 MG tablet Commonly known as: Reglan   oxyCODONE-acetaminophen 5-325 MG tablet Commonly known as: PERCOCET/ROXICET     TAKE these medications   albuterol (2.5 MG/3ML) 0.083% nebulizer solution Commonly known as: PROVENTIL Take 3 mLs (2.5 mg total) by nebulization every 4 (four) hours as needed for wheezing or shortness of breath. What changed: Another medication with the same name was changed. Make sure you understand how and when to take each.   albuterol 108 (90 Base) MCG/ACT inhaler Commonly known as: VENTOLIN HFA 2 puffs every 4 hours as needed for coughing or wheezing. What changed:   how much to take  how to take this  when to take this  reasons to take this   Breo Ellipta 200-25 MCG/INH Aepb Generic drug: fluticasone furoate-vilanterol INHALE 1 PUFF INTO THE LUNGS DAILY   buPROPion 150 MG 12 hr tablet Commonly known as: WELLBUTRIN SR Take 150-300 mg by mouth See admin instructions. Takes 1 tablet in the morning and 2 tablets at bedtime   chlorhexidine 0.12 % solution Commonly known as: PERIDEX Use as directed 15 mLs in the mouth or throat 2 (two) times daily. After meals What changed:   when to take this  reasons to take this   Dupixent 300 MG/2ML prefilled syringe Generic drug: dupilumab INJECT 300 MG SUBCUTANEOUSLY EVERY OTHER WEEK What changed: See the new instructions.   EPINEPHrine 0.3 mg/0.3 mL Soaj injection Commonly known as: EpiPen 2-Pak Inject 0.3 mLs (0.3 mg total) into the muscle once as needed (for severe allergic reaction).   gabapentin 300 MG capsule Commonly known as: NEURONTIN TAKE 3 CAPSULES BY MOUTH TWICE DAILY   Incruse Ellipta 62.5  MCG/INH Aepb Generic drug: umeclidinium bromide Inhale 1 puff into the lungs daily.   Melatonin 10 MG Tabs Take 10 mg by mouth at bedtime.   metoprolol succinate 50 MG 24 hr tablet Commonly known as: TOPROL-XL Take 1 tablet (50 mg total) by mouth daily.   montelukast 10 MG tablet Commonly known as: SINGULAIR TAKE 1 TABLET(10 MG) BY MOUTH AT BEDTIME What changed: See the new instructions.   multivitamin with minerals tablet Take 1 tablet by mouth daily.   nicotine 21 mg/24hr patch Commonly known as: NICODERM CQ - dosed in mg/24 hours Place 21 mg onto the skin daily.   omeprazole 40 MG capsule Commonly known as: PRILOSEC TAKE 1 CAPSULE(40 MG) BY MOUTH DAILY What changed:   how much to take  how to take this  when to take this  additional instructions   ondansetron 8 MG disintegrating tablet Commonly known as: Zofran ODT Take 1 tablet (8 mg total) by mouth every 8 (eight) hours as needed for nausea or vomiting.   oxyCODONE 15 MG immediate release tablet Commonly known as: ROXICODONE Take 1 tablet (15 mg total) by mouth every 4 (four) hours as needed for severe pain or breakthrough pain.   Pancrelipase (Lip-Prot-Amyl) 24000-76000 units Cpep  Take 1 capsule (24,000 Units total) by mouth 3 (three) times daily before meals.   polyethylene glycol 17 g packet Commonly known as: MIRALAX / GLYCOLAX Take 17 g by mouth 2 (two) times daily.   QUEtiapine 100 MG tablet Commonly known as: SEROQUEL Take 100 mg by mouth at bedtime.   rosuvastatin 10 MG tablet Commonly known as: CRESTOR Take 10 mg by mouth daily.   sertraline 50 MG tablet Commonly known as: ZOLOFT Take 75 mg by mouth daily.   thiamine 100 MG tablet Take 100 mg by mouth daily.   VITAMIN D PO Take 1 tablet by mouth daily.      Allergies  Allergen Reactions  . Nsaids Other (See Comments)    Upset stomach   . Peanuts [Peanut Oil] Hives and Itching    "ONLY TREE NUTS"    Follow-up Information     [name], MD. Schedule an appointment as soon as possible for a visit in 1 week(s).   Specialty: Family Medicine Contact information: [address] [place] [address] [phone]        Meryl Dare, MD Follow up on 11/16/2019.   Specialty: Gastroenterology Contact information: [address] [place] [address] [phone]                The results of significant diagnostics from this hospitalization (including imaging, microbiology, ancillary and laboratory) are listed below for reference.    Significant Diagnostic Studies: CT ABDOMEN PELVIS W CONTRAST  Result Date: 11/06/2019 CLINICAL DATA:  Acute pancreatitis, abdominal pain, syncope EXAM: CT ABDOMEN AND PELVIS WITH CONTRAST TECHNIQUE: Multidetector CT imaging of the abdomen and pelvis was performed using the standard protocol following bolus administration of intravenous contrast. CONTRAST:  OMNIPAQUE IOHEXOL 300 MG/ML  SOLN COMPARISON:  10/25/2019 FINDINGS: Lower chest: Small left pleural effusion and associated left basilar atelectasis is again identified. The visualized heart and pericardium are unremarkable. Hepatobiliary: Cholecystectomy has been performed. Moderate hepatomegaly is again noted and appears stable. No intra or extrahepatic biliary ductal dilation. Pancreas: Peripancreatic inflammatory changes extending into the greater omentum have improved in the interval since the prior examination. Intrapancreatic pseudocyst, axial image # 30/2 has increased slightly, measuring 1.5 x 2.0 cm. This courses superiorly along the greater curvature of the stomach toward the fundus, better noted on coronal images number 76-81. There is slightly heterogeneous enhancement of the pancreatic parenchyma within the pancreatic head, however, possibly related to interstitial edema, however, no areas of frank parenchymal necrosis are identified. The pancreatic duct is not dilated. No parenchymal  calcifications are seen. A small amount of phlegmon is again seen adjacent to the fundus of the stomach at the gastroesophageal junction, similar to that noted on prior examination there is a a subdiaphragmatic phlegmon again identified, measuring 2.5 x 3.1 cm at axial image # 11/2 grossly unchanged from prior examination. Spleen: The spleen is mildly enlarged, unchanged. 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 Adrenals/Urinary Tract: Adrenal glands and kidneys are unremarkable. Bladder is decompressed Stomach/Bowel: Large and small bowel are unremarkable. Appendix normal. No free intraperitoneal gas or fluid. Stable loculated mesenteric fluid collection within the left mid abdomen adjacent to several surgical clips possibly representing a postoperative seroma or chronic hematoma. This measures 2.4 x 4.0 cm at axial image # 54/2. Vascular/Lymphatic: No pathologic adenopathy within the abdomen and pelvis. Aside from gastroepiploic collateral secondary to thrombosis  of the splenic vein, the abdominal vasculature is unremarkable. In particular, the superior mesenteric vein and portal vein are patent. Reproductive: Unremarkable Other: The rectum is unremarkable Musculoskeletal: No acute bone abnormality. IMPRESSION: Improving peripancreatic inflammatory changes. Heterogeneous enhancement within the head of the pancreas likely relates to interstitial edema without frank parenchymal necrosis clearly identified. Inflammatory collections are noted within the left upper quadrant the abdomen within the subdiaphragmatic region and perigastric regions adjacent to the gastric fundus at the diaphragmatic hiatus and along the greater curvature extending into the mid body of the pancreas. These appear relatively stable since prior examination. 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 Stable moderate hepatomegaly.  Stable mild splenomegaly. Electronically Signed   By: Helyn NumbersAshesh  Parikh MD   On: 11/06/2019 21:09   CT ABDOMEN PELVIS W CONTRAST  Result Date: 10/25/2019 CLINICAL DATA:  34 year old female with acute pancreatitis, nausea vomiting. EXAM: CT ABDOMEN AND PELVIS WITH CONTRAST TECHNIQUE: Multidetector CT imaging of the abdomen and pelvis was performed using the standard protocol following bolus administration of intravenous contrast. CONTRAST:  100mL OMNIPAQUE IOHEXOL 300 MG/ML  SOLN COMPARISON:  CT abdomen pelvis dated 10/16/2019. FINDINGS: Lower chest: There is a small left pleural effusion. There is partial consolidative changes of the left lung base which may represent atelectasis or infiltrate. Clinical correlation is recommended. No intra-abdominal free air. Upper abdominal edema and small ascites. Hepatobiliary: Fatty infiltration of the liver. No intrahepatic biliary ductal dilatation. Cholecystectomy. No retained calcified stone noted in the central CBD. Pancreas: Inflammatory changes of the pancreas in keeping with known acute pancreatitis. Small focal ill-defined hypodense area in the uncinate process and distal body/tail of the pancreas appears similar to prior CT. These measure up to approximately 13 mm in the distal pancreas and likely represent focal area of edema or small pseudocysts. There is overall pancreatic atrophy for the patient's age. Diffuse upper abdominal edema has progressed since the prior CT. There is however interval decrease in the size of a loculated collection or pseudocyst in the left upper abdomen now measuring approximately 3.9 x 2.7 cm (previously 7.1 x 5.1 cm. The second fluid collection seen anterior to the stomach and the prior CT is not visualized on today's exam. An additional loculated collection along the left hemidiaphragm has decreased in size now measuring 2.7 x 1.6 cm (14/2). Spleen: Top-normal spleen size.  Adrenals/Urinary Tract: The adrenal glands are grossly unremarkable. There is no hydronephrosis on either side. The visualized ureters and urinary bladder appear unremarkable. Stomach/Bowel: There is inflammatory changes and thickening of the distal transverse colon likely reactive to inflammatory changes of the pancreas. There is no bowel obstruction. The appendix is normal. Vascular/Lymphatic: The abdominal aorta and IVC are unremarkable. The SMV and main portal vein are patent. 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 No portal venous gas. There is no adenopathy. Reproductive: The uterus is grossly unremarkable. No adnexal masses. Other: Loculated fluid collection in the left mid abdomen anterior to the psoas muscle similar to prior CT. Attention on follow-up imaging recommended. Musculoskeletal: No acute or significant osseous findings. IMPRESSION: 1. Acute pancreatitis with overall interval decrease in the size of the loculated collections or pseudocyst in the left upper abdomen. 2. 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 3. Small left pleural effusion with partial consolidative changes of the left lung base which may represent atelectasis or infiltrate. Clinical correlation is recommended. 4. Fatty liver. 5. No bowel obstruction. Normal appendix. 6. Segmental  inflammatory changes and thickening of the distal transverse colon likely reactive to acute pancreatitis. Electronically Signed   By: Elgie Collard M.D.   On: 10/25/2019 19:22   CT Abdomen Pelvis W Contrast  Result Date: 10/17/2019 CLINICAL DATA:  Acute nonlocalized abdominal pain. Patient reports decreased appetite and weight loss. EXAM: CT ABDOMEN AND PELVIS WITH CONTRAST TECHNIQUE: Multidetector CT imaging of the abdomen and pelvis was performed using the standard protocol following bolus administration of intravenous contrast. CONTRAST:  OMNIPAQUE IOHEXOL 300 MG/ML  SOLN  COMPARISON:  Abdominal ultrasound 05/24/2018.  CT 09/09/2015 FINDINGS: Lower chest: Elevated left hemidiaphragm. Left basilar consolidation likely compressive atelectasis. Hepatobiliary: Diffusely decreased hepatic density consistent with steatosis. Liver is enlarged spanning 24 cm cranial caudal. No evidence of focal hepatic lesion. Clips in the gallbladder fossa postcholecystectomy. No biliary dilatation. Pancreas: Edematous appearance of the pancreatic head and uncinate process. Low-density in the uncinate process measuring 10 mm suspicious for developing intrapancreatic pseudocyst. 17 mm hypodensity in the pancreatic tail likely pseudocyst. Peripancreatic fat stranding extending along the proximal body and to a lesser extent the tail. There multiple ill-defined peripherally enhancing fluid collections in the left upper quadrant. A crescentic fluid collection courses anteriorly, posteriorly, and anterior superior to the spleen. Superior component measures 6.2 x 2.9 x 2.4 cm under the left hemidiaphragm, series 2, image 9. Collection immediately anterior to the spleen measures 7.1 x 5.1 x 5.8 cm, series 2 image 27. There is likely thin contiguous component to an ellipsoid fluid collection more anteriorly measuring 6 x 2.0 x 3.2 cm, series 2, image 27. Spleen: Perisplenic fluid collections as described. 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 No intrasplenic fluid collection. Upper normal spleen size. Adrenals/Urinary Tract: Normal adrenal glands. No hydronephrosis or perinephric edema. Homogeneous renal enhancement. Urinary bladder is nondistended. Stomach/Bowel: Stomach partially distended. Mild duodenal wall thickening/stranding likely reactive. There is no small bowel distention or obstruction. Scattered fluid-filled colon without colonic wall thickening or inflammation. Vascular/Lymphatic: Patent portal vein. 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  No adenopathy. Reproductive: Uterus and ovaries are unremarkable. Other: Small amount of upper abdominal ascites. Small to moderate volume of non organized free fluid in the pelvis, likely tracking from the pericolic gutters. Fluid collection anterior to the left psoas muscle is diminished in size from 2017, currently measuring 4.1 x 2.6 cm, previously 6.6 x 3.8 cm. This is adjacent embolization coils and likely sequela of prior retroperitoneal hemorrhage. There is no free air. Musculoskeletal: There are no acute or suspicious osseous abnormalities. IMPRESSION: 1. Acute edematous appearance of the pancreatic head and uncinate process, suspicious for acute pancreatitis. Low-density in the uncinate process measuring 10 mm in 17 mm area of low-density in the pancreatic tail likely developing intrapancreatic pseudocysts. 2. Multiple ill-defined peripherally enhancing fluid collections in the left upper quadrant, largest measuring 7.1 x 5.1 x 5.8 cm. Suspect pseudocysts, new from 2017 exam. 3. 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 4. Small amount of ascites in the upper abdomen. Small to moderate volume of non organized free fluid in the pelvis, likely tracking from the pericolic gutters. 5. Hepatomegaly and hepatic steatosis. 6. Decreased size of fluid collection anterior to the left psoas muscle, currently measuring 4.1 x 2.6 cm, previously 6.6 x 3.8 cm. This is adjacent embolization coils and likely sequela of prior retroperitoneal hemorrhage. 7. Elevated left hemidiaphragm with left basilar consolidation likely compressive atelectasis. Electronically Signed   By: Narda Rutherford M.D.   On: 10/17/2019  00:19   MR ABDOMEN MRCP WO CONTRAST  Result Date: 10/17/2019 CLINICAL DATA:  Pancreatitis, pancreatic pseudocysts EXAM: MRI ABDOMEN WITHOUT CONTRAST  (INCLUDING MRCP) TECHNIQUE: Multiplanar multisequence MR imaging of the abdomen was performed. Heavily T2-weighted images of the  biliary and pancreatic ducts were obtained, and three-dimensional MRCP images were rendered by post processing. COMPARISON:  CT abdomen pelvis, 10/16/2019 FINDINGS: Lower chest: No acute findings. Hepatobiliary: Hepatomegaly, maximum span 23.6 cm. Hepatic steatosis. No mass or other parenchymal abnormality identified. Status post cholecystectomy. No biliary ductal dilatation. Pancreas: Mild inflammatory stranding about the pancreas. The pancreatic duct is nondilated. There is an ill-defined area of fluid signal within the pancreatic head measuring approximately 1.3 cm (series 12, image 28). Spleen:  Mild splenomegaly, maximum span 13.3 cm. Adrenals/Urinary Tract: No masses identified. No evidence of hydronephrosis. Stomach/Bowel: Visualized portions within the abdomen are unremarkable. Vascular/Lymphatic: No pathologically enlarged lymph nodes identified. No abdominal aortic aneurysm demonstrated. Other: Trace perihepatic and perisplenic ascites. There are multiple, thick-walled loculated appearing fluid collections in the left upper quadrant, posterior to the gastric body, superior to the spleen, anterior to the stomach, and the largest anterior to the spleen measuring 7.1 x 4.4 cm (series 12, image 21). Musculoskeletal: No suspicious bone lesions identified. IMPRESSION: 1. Mild inflammatory stranding about the pancreas, consistent with acute pancreatitis. 2. There is an ill-defined area of fluid signal within the pancreatic head measuring approximately 1.3 cm. This may represent a small developing acute pancreatic fluid collection. 3. 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 The presence or absence of infection cannot be established by imaging. 4. No pancreatic or biliary ductal dilatation. 5. Trace perihepatic and perisplenic ascites. 6. Mild splenomegaly. 7. Hepatomegaly and hepatic steatosis. 8. Status post cholecystectomy.  Electronically Signed   By: Lauralyn Primes M.D.   On: 10/17/2019 08:40   MR 3D Recon At Scanner  Result Date: 10/17/2019 CLINICAL DATA:  Pancreatitis, pancreatic pseudocysts EXAM: MRI ABDOMEN WITHOUT CONTRAST  (INCLUDING MRCP) TECHNIQUE: Multiplanar multisequence MR imaging of the abdomen was performed. Heavily T2-weighted images of the biliary and pancreatic ducts were obtained, and three-dimensional MRCP images were rendered by post processing. COMPARISON:  CT abdomen pelvis, 10/16/2019 FINDINGS: Lower chest: No acute findings. Hepatobiliary: Hepatomegaly, maximum span 23.6 cm. Hepatic steatosis. No mass or other parenchymal abnormality identified. Status post cholecystectomy. No biliary ductal dilatation. Pancreas: Mild inflammatory stranding about the pancreas. The pancreatic duct is nondilated. There is an ill-defined area of fluid signal within the pancreatic head measuring approximately 1.3 cm (series 12, image 28). Spleen:  Mild splenomegaly, maximum span 13.3 cm. Adrenals/Urinary Tract: No masses identified. No evidence of hydronephrosis. Stomach/Bowel: Visualized portions within the abdomen are unremarkable. Vascular/Lymphatic: No pathologically enlarged lymph nodes identified. No abdominal aortic aneurysm demonstrated. Other: Trace perihepatic and perisplenic ascites. There are multiple, thick-walled loculated appearing fluid collections in the left upper quadrant, posterior to the gastric body, superior to the spleen, anterior to the stomach, and the largest anterior to the spleen measuring 7.1 x 4.4 cm (series 12, image 21). Musculoskeletal: No suspicious bone lesions identified. IMPRESSION: 1. Mild inflammatory stranding about the pancreas, consistent with acute pancreatitis. 2. There is an ill-defined area of fluid signal within the pancreatic head measuring approximately 1.3 cm. This may represent a small developing acute pancreatic fluid collection. 3. Multiple, thick-walled loculated appearing  fluid collections in the left upper quadrant, likely pancreatic pseudocysts. The presence or absence of infection cannot be established by imaging. 4. No pancreatic  or biliary ductal dilatation. 5. Trace perihepatic and perisplenic ascites. 6. Mild splenomegaly. 7. Hepatomegaly and hepatic steatosis. 8. Status post cholecystectomy. Electronically Signed   By: Lauralyn Primes M.D.   On: 10/17/2019 08:40   DG Abd Portable 1V  Result Date: 10/22/2019 CLINICAL DATA:  Dobbhoff tube placement EXAM: PORTABLE ABDOMEN - 1 VIEW COMPARISON:  None. FINDINGS: Dobbhoff tube is identified with distal tip in the mid stomach. Patchy opacity of left lung base is identified. The heart size is enlarged. IMPRESSION: Dobbhoff tube is identified with distal tip in the mid stomach. Electronically Signed   By: Sherian Rein M.D.   On: 10/22/2019 14:35   DG Basil Dess Tube Plc W/Fl W/Rad  Result Date: 10/25/2019 INDICATION: 34 year old female with pancreatitis. Request was made to reposition the patient's feeding tube in a post pyloric position. EXAM: RF-NASOGASTRIC TUBE PLACEMENT WITH FL AND WITH RAD CONTRAST:  No contrast was used during the examination. MEDICATIONS: Viscous lidocaine. ANESTHESIA/SEDATION: None. FLUOROSCOPY TIME:  Fluoroscopy Time: 1.4 sec Radiation Exposure Index (if provided by the fluoroscopic device): 45.10 mGy Number of Acquired Spot Images: 2 COMPLICATIONS: None immediate. PROCEDURE: The patient's existing feeding tube was removed due to poor condition of the outside coding. A new 10 [place] Dobhoff tube was lubricated with viscous lidocaine and inserted into the left nostril. Under intermittent fluoroscopic guidance, the Dobhoff tube was advanced through the stomach, through the duodenum with tip ultimately terminating over the third portion of the duodenum. A spot fluoroscopic image was saved for documentation purposes. The tube was affixed to the patient's nose with tape. The patient tolerated the procedure well  without immediate postprocedural complication. IMPRESSION: Satisfactory placement of a nasogastric tube with tip in the third portion of the duodenum. Electronically Signed   By: Emmaline Kluver M.D.   On: 10/25/2019 12:42    Microbiology: Recent Results (from the past 240 hour(s))  SARS Coronavirus 2 by RT PCR (hospital order, performed in [facility] hospital lab) Nasopharyngeal Nasopharyngeal Swab     Status: None   Collection Time: 11/06/19  9:46 PM   Specimen: Nasopharyngeal Swab  Result Value Ref Range Status   SARS Coronavirus 2 NEGATIVE NEGATIVE Final    Comment: (NOTE) SARS-CoV-2 target nucleic acids are NOT DETECTED.  The SARS-CoV-2 RNA is generally detectable in upper and lower respiratory specimens during the acute phase of infection. The lowest concentration of SARS-CoV-2 viral copies this assay can detect is 250 copies / mL. A negative result does not preclude SARS-CoV-2 infection and should not be used as the sole basis for treatment or other patient management decisions.  A negative result may occur with improper specimen collection / handling, submission of specimen other than nasopharyngeal swab, presence of viral mutation(s) within the areas targeted by this assay, and inadequate number of viral copies (<250 copies / mL). A negative result must be combined with clinical observations, patient history, and epidemiological information.  Fact Sheet for Patients:   BoilerBrush.com.cy  Fact Sheet for Healthcare Providers: https://pope.com/  This test is not yet approved or  cleared by the [place] FDA and has been authorized for detection and/or diagnosis of SARS-CoV-2 by FDA under an Emergency Use Authorization (EUA).  This EUA will remain in effect (meaning this test can be used) for the duration of the COVID-19 declaration under Section 564(b)(1) of the Act, 21 U.S.C. section 360bbb-3(b)(1), unless the  authorization is terminated or revoked sooner.  Performed at [facility], [address] [place], [place]  [address]      Labs: Basic Metabolic Panel: Recent Labs  Lab 11/08/19 0330 11/09/19 0308 11/10/19 0308 11/11/19 0337 11/12/19 0435  NA 138 138 136 139 132*  K 3.2* 3.8 4.5 3.7 3.4*  CL 101 102 98 101 95*  CO2 27 24 25 28 27   GLUCOSE 118* 110* 229* 105* 105*  BUN <5* 5* 5* 6 <5*  CREATININE 0.62 0.47 0.58 0.51 0.61  CALCIUM 8.7* 8.6* 8.8* 8.7* 8.6*   Liver Function Tests: Recent Labs  Lab 11/06/19 1205 11/08/19 0330 11/09/19 0308 11/11/19 0337 11/12/19 0435  AST 15 28 14* 12* 15  ALT 11 12 9 8 8   ALKPHOS 177* 158* 140* 138* 162*  BILITOT 1.0 0.4 0.5 0.5 0.3  PROT 7.8 6.9 6.3* 6.4* 6.7  ALBUMIN 3.0* 2.8* 2.6* 2.5* 2.7*   Recent Labs  Lab 11/06/19 1205  LIPASE 89*   No results for input(s): AMMONIA in the last 168 hours. CBC: Recent Labs  Lab 11/07/19 0606 11/08/19 0330 11/09/19 0308 11/10/19 0712 11/12/19 0435  WBC 7.0 8.2 7.3 7.5 6.9  HGB 10.3* 11.4* 10.3* 9.5* 9.4*  HCT 34.5* 39.2 35.3* 31.6* 32.0*  MCV 91.3 92.2 92.7 89.3 93.0  PLT 316 347 300 308 356   Cardiac Enzymes: No results for input(s): CKTOTAL, CKMB, CKMBINDEX, TROPONINI in the last 168 hours. BNP: BNP (last 3 results) No results for input(s): BNP in the last 8760 hours.  ProBNP (last 3 results) No results for input(s): PROBNP in the last 8760 hours.  CBG: No results for input(s): GLUCAP in the last 168 hours.     Signed:  Zannie Cove MD.  [facility] 11/12/2019, 10:39 AM

## 2019-11-16 ENCOUNTER — Encounter: Payer: Self-pay | Admitting: Physician Assistant

## 2019-11-16 ENCOUNTER — Other Ambulatory Visit: Payer: Self-pay | Admitting: Family Medicine

## 2019-11-16 ENCOUNTER — Ambulatory Visit: Payer: BC Managed Care – PPO | Admitting: Physician Assistant

## 2019-11-16 VITALS — BP 110/70 | HR 60 | Ht 65.0 in | Wt 227.6 lb

## 2019-11-16 DIAGNOSIS — K861 Other chronic pancreatitis: Secondary | ICD-10-CM

## 2019-11-16 DIAGNOSIS — K859 Acute pancreatitis without necrosis or infection, unspecified: Secondary | ICD-10-CM

## 2019-11-16 NOTE — Progress Notes (Signed)
Chief Complaint: Acute on chronic pancreatitis, epigastric pain  HPI:    [name] is a 34 year old female, first seen by Dr. Russella DarStark, who presents to clinic today for follow-up after recent hospitalization for acute on chronic pancreatitis.    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  At that time reported that she quit alcohol 3 years ago but had just recently been discharged for pancreatitis on July 14.  At that time she had been drinking treated conservatively and improved and sent home.  At that time reported barely being able to eat anything, feeling full and having worsening pain as well as nausea.  CT scan on that admission actually showed improved peripancreatic inflammatory changes along with a pseudocyst which appeared relatively stable since prior exam.  Patient had a cholecystectomy over a year ago but had no residual gallstones in her bile ducts on MRI last month.  [facility]  She was discharged home on oxycodone 50 mg every 4 hours as needed.  Plan is to consider the repeat MRI or EUS down the road.    11/07/2019 patient consulted by her service (please see that note).  11/12/2019 was our last progress note.  It was discussed the etiology had been unclear including autoimmune evaluation.  Lasix had been stopped due to concern for being potential culprit, no radiologic evidence for chronic pancreatitis.  As discussed she may need an EUS at some point.  She is placed on a low-fat diet.  Also noted she had fatty liver disease and it was discussed to consider outpatient fibrosure and elastography.  That time is recommended repeat MRI should wait at least 2 to 3 months to allow acute inflammation to subside.    Today, the patient presents to clinic accompanied by her mother who does assist with history.  She explains that she is doing okay as long as she takes her Oxycodone.  She has been seeing her  PCP who is willing to prescribe this for her until she gets in with the pain clinic.  Tells me that currently she is trying to use this just as needed which is typically every 6 hours or so for pain.  Her mother tells me she is only able to eat 2 tablespoons of food at a time and they are trying to avoid anything with flavor and just doing bland.  Patient tells me after she eats she gets a full epigastric feeling and some discomfort, this is better she is just taken pain medicine.  Denies any further nausea or vomiting.  Overall they are just somewhat frustrated with the chronicity of this issue.  She is also doing Breeze shakes in order to supplement.  Currently having normal daily bowel movements even sometimes 3 times a day.    Denies fever, chills or symptoms that awaken her from sleep.     Past Medical History:  Diagnosis Date  . Alcoholism in remission (HCC)    per pt in remission since 2017  . Anemia   . AR (allergic rhinitis)    allergist-- dr Lucie Leatherkozlow  . Biliary dyskinesia   . Diastolic CHF (HCC)    followd by dr Demetrius Charityp. Eden Emmsnishan  . Environmental allergies   . GERD (gastroesophageal reflux disease)   . Headache   . History of acute pancreatitis    alcoholic pancreatitis ,  2016 and 2017  . History of sepsis 06/12/2016   with CAP  . History of  syncope   . 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 hematoma due to coumadin treated with IR embolization of the IMA branches  . Hypertension   . Polyneuropathy   . Prolonged QT syndrome    cardiologist-- dr Eden Emms (notes in epic)  . Severe persistent asthma    pulmonologist-- dr dr Belva Crome (notes in epic)    Past Surgical History:  Procedure Laterality Date  . CHOLECYSTECTOMY N/A 06/21/2018   Procedure: LAPAROSCOPIC CHOLECYSTECTOMY;  Surgeon: Abigail Miyamoto, MD;  Location: [facility];  Service: General;  Laterality: N/A;  . ESOPHAGOGASTRODUODENOSCOPY N/A 07/17/2014   Procedure:  ESOPHAGOGASTRODUODENOSCOPY (EGD);  Surgeon: Dorena Cookey, MD;  Location: [facility];  Service: Endoscopy;  Laterality: N/A;  . TYMPANOSTOMY TUBE PLACEMENT Bilateral 11 MONTHS OLD  . VIDEO BRONCHOSCOPY Bilateral 10/08/2016   Procedure: VIDEO BRONCHOSCOPY WITH FLUORO;  Surgeon: Chilton Greathouse, MD;  Location: [facility];  Service: Cardiopulmonary;  Laterality: Bilateral;    Current Outpatient Medications  Medication Sig Dispense Refill  . albuterol (PROVENTIL) (2.5 MG/3ML) 0.083% nebulizer solution Take 3 mLs (2.5 mg total) by nebulization every 4 (four) hours as needed for wheezing or shortness of breath. 75 mL 1  . albuterol (VENTOLIN HFA) 108 (90 Base) MCG/ACT inhaler 2 puffs every 4 hours as needed for coughing or wheezing. (Patient taking differently: Inhale 2 puffs into the lungs every 6 (six) hours as needed for wheezing or shortness of breath. 2 puffs every 4 hours as needed for coughing or wheezing.) 18 g 1  . BREO ELLIPTA 200-25 MCG/INH AEPB INHALE 1 PUFF INTO THE LUNGS DAILY 180 each 1  . buPROPion (WELLBUTRIN SR) 150 MG 12 hr tablet Take 150-300 mg by mouth See admin instructions. Takes 1 tablet in the morning and 2 tablets at bedtime    . chlorhexidine (PERIDEX) 0.12 % solution Use as directed 15 mLs in the mouth or throat 2 (two) times daily. After meals (Patient taking differently: Use as directed 15 mLs in the mouth or throat daily as needed (dental plaque). After meals ) 2700 mL 1  . Cholecalciferol (VITAMIN D PO) Take 1 tablet by mouth daily.     . DUPIXENT 300 MG/2ML prefilled syringe INJECT 300 MG SUBCUTANEOUSLY EVERY OTHER WEEK (Patient taking differently: Inject 300 mg into the skin every 14 (fourteen) days. ) 4 mL 11  . EPINEPHrine (EPIPEN 2-PAK) 0.3 mg/0.3 mL IJ SOAJ injection Inject 0.3 mLs (0.3 mg total) into the muscle once as needed (for severe allergic reaction). 2 Device 1  . gabapentin (NEURONTIN) 300 MG capsule TAKE 3 CAPSULES BY MOUTH TWICE DAILY (Patient taking  differently: Take 900 mg by mouth 2 (two) times daily. ) 180 capsule 0  . lipase/protease/amylase 24000-76000 units CPEP Take 1 capsule (24,000 Units total) by mouth 3 (three) times daily before meals. 90 capsule 0  . Melatonin 10 MG TABS Take 10 mg by mouth at bedtime.    . metoprolol succinate (TOPROL-XL) 50 MG 24 hr tablet Take 1 tablet (50 mg total) by mouth daily. 90 tablet 0  . montelukast (SINGULAIR) 10 MG tablet TAKE 1 TABLET(10 MG) BY MOUTH AT BEDTIME (Patient taking differently: Take 10 mg by mouth at bedtime. ) 30 tablet 0  . Multiple Vitamins-Minerals (MULTIVITAMIN WITH MINERALS) tablet Take 1 tablet by mouth daily.    Marland Kitchen omeprazole (PRILOSEC) 40 MG capsule TAKE 1 CAPSULE(40 MG) BY MOUTH DAILY (Patient taking differently: Take 40 mg by mouth daily. ) 90 capsule 1  .  ondansetron (ZOFRAN ODT) 8 MG disintegrating tablet Take 1 tablet (8 mg total) by mouth every 8 (eight) hours as needed for nausea or vomiting. 20 tablet 0  . oxyCODONE (ROXICODONE) 15 MG immediate release tablet Take 1 tablet (15 mg total) by mouth every 4 (four) hours as needed for severe pain or breakthrough pain. 60 tablet 0  . QUEtiapine (SEROQUEL) 100 MG tablet Take 100 mg by mouth at bedtime.    . rosuvastatin (CRESTOR) 10 MG tablet Take 10 mg by mouth daily.    . sertraline (ZOLOFT) 50 MG tablet Take 75 mg by mouth daily.    Marland Kitchen thiamine 100 MG tablet Take 100 mg by mouth daily.    Marland Kitchen umeclidinium bromide (INCRUSE ELLIPTA) 62.5 MCG/INH AEPB Inhale 1 puff into the lungs daily. 3 each 1  . spironolactone (ALDACTONE) 25 MG tablet SMARTSIG:1 Tablet(s) By Mouth 1 to 2 Times Daily     Current Facility-Administered Medications  Medication Dose Route Frequency Provider Last Rate Last Admin  . umeclidinium bromide (INCRUSE ELLIPTA) 62.5 MCG/INH 1 puff  1 puff Inhalation Daily Kozlow, Alvira Philips, MD        Allergies as of 11/16/2019 - Review Complete 11/16/2019  Allergen Reaction Noted  . Nsaids Other (See Comments) 06/13/2018    . Peanuts [peanut oil] Hives and Itching 07/06/2014    Family History  Problem Relation Age of Onset  . Heart failure Mother   . Colon polyps Mother   . Heart failure Father   . Diabetes Father   . Heart failure Maternal Grandfather   . Colon cancer Neg Hx     Social History   Socioeconomic History  . Marital status: Married    Spouse name: [name]  . Number of children: 0  . Years of education: Not on file  . Highest education level: Not on file  Occupational History  . Occupation: Engineer, production  Tobacco Use  . Smoking status: Current Every Day Smoker    Packs/day: 0.50    Years: 5.00    Pack years: 2.50    Types: Cigarettes  . Smokeless tobacco: Never Used  Vaping Use  . Vaping Use: Never used  Substance and Sexual Activity  . Alcohol use: Not Currently    Alcohol/week: 0.0 standard drinks    Comment: pt hx alcohol abuse , in remission since 2017  . Drug use: Not Currently    Comment: "POT WHEN I WAS IN HIGH SCHOOL"  . Sexual activity: Yes    Birth control/protection: None  Other Topics Concern  . Not on file  Social History Narrative   Married.   Social Determinants of Health   Financial Resource Strain:   . Difficulty of Paying Living Expenses:   Food Insecurity:   . Worried About Programme researcher, broadcasting/film/video in the Last Year:   . Barista in the Last Year:   Transportation Needs:   . Freight forwarder (Medical):   Marland Kitchen Lack of Transportation (Non-Medical):   Physical Activity:   . Days of Exercise per Week:   . Minutes of Exercise per Session:   Stress:   . Feeling of Stress :   Social Connections:   . Frequency of Communication with Friends and Family:   . Frequency of Social Gatherings with Friends and Family:   . Attends Religious Services:   . Active Member of Clubs or Organizations:   . Attends Banker Meetings:   Marland Kitchen Marital Status:   Intimate Programme researcher, broadcasting/film/video  Violence:   . Fear of Current or Ex-Partner:   . Emotionally Abused:    Marland Kitchen Physically Abused:   . Sexually Abused:     Review of Systems:    Constitutional: No weight loss, fever or chills Cardiovascular: No chest pain Respiratory: No SOB  Gastrointestinal: See HPI and otherwise negative   Physical Exam:  Vital signs: BP 110/70   Pulse 60   Ht 5\' 5"  (1.651 m)   Wt (!) 227 lb 9.6 oz (103.2 kg)   BMI 37.87 kg/m   Constitutional:   Pleasant Caucasian female appears to be in NAD, Well developed, Well nourished, alert and cooperative Respiratory: Respirations even and unlabored. Lungs clear to auscultation bilaterally.   No wheezes, crackles, or rhonchi.  Cardiovascular: Normal S1, S2. No MRG. Regular rate and rhythm. No peripheral edema, cyanosis or pallor.  Gastrointestinal:  Soft, nondistended, marked epigastric ttp, No rebound or guarding. Normal bowel sounds. No appreciable masses or hepatomegaly. Rectal:  Not performed.  Psychiatric: Demonstrates good judgement and reason without abnormal affect or behaviors.  RELEVANT LABS AND IMAGING: CBC    Component Value Date/Time   WBC 6.9 11/12/2019 0435   RBC 3.44 (L) 11/12/2019 0435   HGB 9.4 (L) 11/12/2019 0435   HGB 14.1 06/20/2017 1418   HCT 32.0 (L) 11/12/2019 0435   HCT 41.0 06/20/2017 1418   PLT 356 11/12/2019 0435   PLT 230 06/20/2017 1418   MCV 93.0 11/12/2019 0435   MCV 82 06/20/2017 1418   MCH 27.3 11/12/2019 0435   MCHC 29.4 (L) 11/12/2019 0435   RDW 13.4 11/12/2019 0435   RDW 15.3 06/20/2017 1418   LYMPHSABS 2.2 10/18/2019 0316   LYMPHSABS 2.0 06/20/2017 1418   MONOABS 0.6 10/18/2019 0316   EOSABS 0.2 10/18/2019 0316   EOSABS 0.1 06/20/2017 1418   BASOSABS 0.1 10/18/2019 0316   BASOSABS 0.1 06/20/2017 1418    CMP     Component Value Date/Time   NA 132 (L) 11/12/2019 0435   NA 139 10/15/2016 0825   K 3.4 (L) 11/12/2019 0435   CL 95 (L) 11/12/2019 0435   CO2 27 11/12/2019 0435   GLUCOSE 105 (H) 11/12/2019 0435   BUN <5 (L) 11/12/2019 0435   BUN 11 10/15/2016 0825    CREATININE 0.61 11/12/2019 0435   CALCIUM 8.6 (L) 11/12/2019 0435   PROT 6.7 11/12/2019 0435   PROT 7.6 09/02/2014 1507   ALBUMIN 2.7 (L) 11/12/2019 0435   ALBUMIN 4.1 09/02/2014 1507   AST 15 11/12/2019 0435   ALT 8 11/12/2019 0435   ALKPHOS 162 (H) 11/12/2019 0435   BILITOT 0.3 11/12/2019 0435   BILITOT 0.7 09/02/2014 1507   GFRNONAA >60 11/12/2019 0435   GFRAA >60 11/12/2019 0435    Assessment: 1.  Acute on?  Chronic pancreatitis: Patient was recently hospitalized twice for acute on possibly chronic pancreatitis (there is some discussion about whether or not she truly has chronic pancreatitis given that there are no calcifications.  She does have pseudocyst formation), recently discharged and seems to be doing okay at home on oxycodone  Plan: 1.  Per review of hospital notes it appears the plan is for repeat MRI in 2-3 months.  We will schedule this for the patient. 2.  Discussed that if she is unable to tolerate regular food then she needs to decrease back down to a clear liquid diet and continue to supplement with breeze shakes. 3.  Would recommend that she go ahead and schedule out her Oxycodone  which is being prescribed by her PCP until she gets to the pain clinic.  Recommend she take this every 6 hours that way she can hopefully circumvent any pain instead of possibly getting to a place where the pain medicine does not help and she ends up back in the hospital. 4.  Patient and her mother asked many questions, specifically about the chronicity of this.  I think this will be better told after upcoming MRI.  Patient may also benefit from EUS in the future per recommendations from Dr. Christella Hartigan, likely after MRI. 5.  Upon further review of chart patient also has fatty liver, there was some discussion about further evaluation of this in the future.  Can discuss at follow-up. 6.  Patient to follow in clinic per recommendations after MRI in 2 to 3 months.  She will call if she has problems  before then.  Hyacinth Meeker, PA-C [facility] 11/16/2019, 8:34 AM  Cc: [name], MD

## 2019-11-16 NOTE — Patient Instructions (Addendum)
If you are age 35 or older, your body mass index should be between 23-30. Your Body mass index is 37.87 kg/m. If this is out of the aforementioned range listed, please consider follow up with your Primary Care Provider.  If you are age 44 or younger, your body mass index should be between 19-25. Your Body mass index is 37.87 kg/m. If this is out of the aformentioned range listed, please consider follow up with your Primary Care Provider.   Schedule pain medication for the next 2 weeks.  Go back to clear liquid diet.   Will call you with MRI appointment.

## 2019-11-16 NOTE — Progress Notes (Signed)
Reviewed and agree with management plan.  Khali Albanese T. Heyden Jaber, MD FACG [facility]  

## 2019-12-13 ENCOUNTER — Other Ambulatory Visit: Payer: Self-pay | Admitting: Family Medicine

## 2020-01-06 ENCOUNTER — Other Ambulatory Visit: Payer: Self-pay | Admitting: Family Medicine

## 2020-01-08 ENCOUNTER — Other Ambulatory Visit: Payer: Self-pay | Admitting: Family Medicine

## 2020-01-09 ENCOUNTER — Other Ambulatory Visit: Payer: Self-pay | Admitting: *Deleted

## 2020-01-16 ENCOUNTER — Telehealth: Payer: Self-pay | Admitting: *Deleted

## 2020-01-16 DIAGNOSIS — K862 Cyst of pancreas: Secondary | ICD-10-CM

## 2020-01-16 NOTE — Telephone Encounter (Signed)
MRI scheduled at [facility] for Wednesday 01/23/20 @ 5 pm. Left message for patient to call the office.

## 2020-01-16 NOTE — Telephone Encounter (Signed)
-----   Message from Mariane Duval, [place] sent at 11/16/2019  9:03 AM EDT ----- Need MRI abd with pancreatic protocol.

## 2020-01-18 NOTE — Telephone Encounter (Signed)
Left message for patient to call office.  

## 2020-01-23 ENCOUNTER — Ambulatory Visit (HOSPITAL_COMMUNITY): Payer: BC Managed Care – PPO

## 2020-01-25 ENCOUNTER — Ambulatory Visit (HOSPITAL_COMMUNITY): Payer: BC Managed Care – PPO

## 2020-02-06 ENCOUNTER — Ambulatory Visit (HOSPITAL_COMMUNITY)
Admission: RE | Admit: 2020-02-06 | Discharge: 2020-02-06 | Disposition: A | Discharge status: Home / Self Care | Payer: BC Managed Care – PPO | Source: Ambulatory Visit | Attending: Physician Assistant | Admitting: Physician Assistant

## 2020-02-06 ENCOUNTER — Other Ambulatory Visit: Payer: Self-pay

## 2020-02-06 DIAGNOSIS — K862 Cyst of pancreas: Secondary | ICD-10-CM | POA: Insufficient documentation

## 2020-02-06 MED ORDER — GADOBUTROL 1 MMOL/ML IV SOLN
10.0000 mL | Freq: Once | INTRAVENOUS | Status: AC | PRN
  Administered 2020-02-06: 10 mL via INTRAVENOUS

## 2020-02-29 ENCOUNTER — Other Ambulatory Visit: Payer: Self-pay | Admitting: Allergy and Immunology

## 2020-03-17 ENCOUNTER — Other Ambulatory Visit: Payer: Self-pay | Admitting: Family Medicine

## 2020-03-26 ENCOUNTER — Other Ambulatory Visit: Payer: Self-pay | Admitting: Family Medicine

## 2020-05-09 ENCOUNTER — Other Ambulatory Visit: Payer: Self-pay | Admitting: Family Medicine

## 2020-05-29 ENCOUNTER — Other Ambulatory Visit: Payer: Self-pay | Admitting: Allergy and Immunology

## 2020-05-30 NOTE — Telephone Encounter (Signed)
Please advise 

## 2020-06-02 ENCOUNTER — Other Ambulatory Visit: Payer: Self-pay | Admitting: *Deleted

## 2020-06-02 NOTE — Telephone Encounter (Signed)
ERROR

## 2020-08-05 ENCOUNTER — Encounter (HOSPITAL_COMMUNITY): Payer: Self-pay

## 2020-08-05 ENCOUNTER — Emergency Department (HOSPITAL_COMMUNITY): Payer: BC Managed Care – PPO

## 2020-08-05 ENCOUNTER — Inpatient Hospital Stay (HOSPITAL_COMMUNITY)
Admission: EM | Admit: 2020-08-05 | Discharge: 2020-08-10 | DRG: 439 | Disposition: A | Discharge status: Home / Self Care | Payer: BC Managed Care – PPO | Attending: Internal Medicine | Admitting: Internal Medicine

## 2020-08-05 ENCOUNTER — Other Ambulatory Visit: Payer: Self-pay

## 2020-08-05 DIAGNOSIS — F32A Depression, unspecified: Secondary | ICD-10-CM

## 2020-08-05 DIAGNOSIS — K860 Alcohol-induced chronic pancreatitis: Secondary | ICD-10-CM

## 2020-08-05 DIAGNOSIS — R9431 Abnormal electrocardiogram [ECG] [EKG]: Secondary | ICD-10-CM

## 2020-08-05 DIAGNOSIS — I5032 Chronic diastolic (congestive) heart failure: Secondary | ICD-10-CM | POA: Diagnosis present

## 2020-08-05 DIAGNOSIS — Z6831 Body mass index (BMI) 31.0-31.9, adult: Secondary | ICD-10-CM

## 2020-08-05 DIAGNOSIS — E785 Hyperlipidemia, unspecified: Secondary | ICD-10-CM | POA: Diagnosis present

## 2020-08-05 DIAGNOSIS — K859 Acute pancreatitis without necrosis or infection, unspecified: Secondary | ICD-10-CM | POA: Diagnosis present

## 2020-08-05 DIAGNOSIS — Z886 Allergy status to analgesic agent status: Secondary | ICD-10-CM | POA: Diagnosis not present

## 2020-08-05 DIAGNOSIS — E860 Dehydration: Secondary | ICD-10-CM | POA: Diagnosis present

## 2020-08-05 DIAGNOSIS — K863 Pseudocyst of pancreas: Secondary | ICD-10-CM | POA: Diagnosis present

## 2020-08-05 DIAGNOSIS — Z79899 Other long term (current) drug therapy: Secondary | ICD-10-CM | POA: Diagnosis not present

## 2020-08-05 DIAGNOSIS — Z86718 Personal history of other venous thrombosis and embolism: Secondary | ICD-10-CM | POA: Diagnosis not present

## 2020-08-05 DIAGNOSIS — K861 Other chronic pancreatitis: Secondary | ICD-10-CM | POA: Diagnosis not present

## 2020-08-05 DIAGNOSIS — E669 Obesity, unspecified: Secondary | ICD-10-CM | POA: Diagnosis present

## 2020-08-05 DIAGNOSIS — F17210 Nicotine dependence, cigarettes, uncomplicated: Secondary | ICD-10-CM | POA: Diagnosis present

## 2020-08-05 DIAGNOSIS — F1021 Alcohol dependence, in remission: Secondary | ICD-10-CM | POA: Diagnosis present

## 2020-08-05 DIAGNOSIS — Z91018 Allergy to other foods: Secondary | ICD-10-CM | POA: Diagnosis not present

## 2020-08-05 DIAGNOSIS — J4550 Severe persistent asthma, uncomplicated: Secondary | ICD-10-CM | POA: Diagnosis present

## 2020-08-05 DIAGNOSIS — Z8249 Family history of ischemic heart disease and other diseases of the circulatory system: Secondary | ICD-10-CM | POA: Diagnosis not present

## 2020-08-05 DIAGNOSIS — Z888 Allergy status to other drugs, medicaments and biological substances status: Secondary | ICD-10-CM

## 2020-08-05 DIAGNOSIS — K760 Fatty (change of) liver, not elsewhere classified: Secondary | ICD-10-CM | POA: Diagnosis present

## 2020-08-05 DIAGNOSIS — G894 Chronic pain syndrome: Secondary | ICD-10-CM | POA: Diagnosis present

## 2020-08-05 DIAGNOSIS — Z7951 Long term (current) use of inhaled steroids: Secondary | ICD-10-CM

## 2020-08-05 DIAGNOSIS — G629 Polyneuropathy, unspecified: Secondary | ICD-10-CM | POA: Diagnosis present

## 2020-08-05 DIAGNOSIS — K219 Gastro-esophageal reflux disease without esophagitis: Secondary | ICD-10-CM | POA: Diagnosis present

## 2020-08-05 DIAGNOSIS — I110 Hypertensive heart disease with heart failure: Secondary | ICD-10-CM | POA: Diagnosis present

## 2020-08-05 DIAGNOSIS — Z20822 Contact with and (suspected) exposure to covid-19: Secondary | ICD-10-CM | POA: Diagnosis present

## 2020-08-05 DIAGNOSIS — E876 Hypokalemia: Secondary | ICD-10-CM | POA: Diagnosis present

## 2020-08-05 LAB — CBC WITH DIFFERENTIAL/PLATELET
Abs Immature Granulocytes: 0.05 10*3/uL (ref 0.00–0.07)
Basophils Absolute: 0.1 10*3/uL (ref 0.0–0.1)
Basophils Relative: 1 %
Eosinophils Absolute: 0 10*3/uL (ref 0.0–0.5)
Eosinophils Relative: 0 %
HCT: 42.2 % (ref 36.0–46.0)
Hemoglobin: 14.3 g/dL (ref 12.0–15.0)
Immature Granulocytes: 0 %
Lymphocytes Relative: 16 %
Lymphs Abs: 2.1 10*3/uL (ref 0.7–4.0)
MCH: 30.4 pg (ref 26.0–34.0)
MCHC: 33.9 g/dL (ref 30.0–36.0)
MCV: 89.8 fL (ref 80.0–100.0)
Monocytes Absolute: 0.9 10*3/uL (ref 0.1–1.0)
Monocytes Relative: 7 %
Neutro Abs: 10.1 10*3/uL — ABNORMAL HIGH (ref 1.7–7.7)
Neutrophils Relative %: 76 %
Platelets: 340 10*3/uL (ref 150–400)
RBC: 4.7 MIL/uL (ref 3.87–5.11)
RDW: 12.8 % (ref 11.5–15.5)
WBC: 13.3 10*3/uL — ABNORMAL HIGH (ref 4.0–10.5)
nRBC: 0 % (ref 0.0–0.2)

## 2020-08-05 LAB — COMPREHENSIVE METABOLIC PANEL
ALT: 14 U/L (ref 0–44)
AST: 19 U/L (ref 15–41)
Albumin: 3.2 g/dL — ABNORMAL LOW (ref 3.5–5.0)
Alkaline Phosphatase: 129 U/L — ABNORMAL HIGH (ref 38–126)
Anion gap: 10 (ref 5–15)
BUN: 8 mg/dL (ref 6–20)
CO2: 29 mmol/L (ref 22–32)
Calcium: 9.1 mg/dL (ref 8.9–10.3)
Chloride: 102 mmol/L (ref 98–111)
Creatinine, Ser: 0.68 mg/dL (ref 0.44–1.00)
GFR, Estimated: >60 mL/min (ref >60)
Glucose, Bld: 136 mg/dL — ABNORMAL HIGH (ref 70–99)
Potassium: 3.4 mmol/L — ABNORMAL LOW (ref 3.5–5.1)
Sodium: 141 mmol/L (ref 135–145)
Total Bilirubin: 1.2 mg/dL (ref 0.3–1.2)
Total Protein: 7.1 g/dL (ref 6.5–8.1)

## 2020-08-05 LAB — URINALYSIS, ROUTINE W REFLEX MICROSCOPIC
Glucose, UA: NEGATIVE mg/dL
Hgb urine dipstick: NEGATIVE
Ketones, ur: 20 mg/dL — AB
Nitrite: NEGATIVE
Protein, ur: 100 mg/dL — AB
Specific Gravity, Urine: 1.033 — ABNORMAL HIGH (ref 1.005–1.030)
pH: 6 (ref 5.0–8.0)

## 2020-08-05 LAB — LIPASE, BLOOD: Lipase: 146 U/L — ABNORMAL HIGH (ref 11–51)

## 2020-08-05 LAB — HCG, QUANTITATIVE, PREGNANCY: hCG, Beta Chain, Quant, S: <1 m[IU]/mL (ref <5)

## 2020-08-05 MED ORDER — PANCRELIPASE (LIP-PROT-AMYL) 36000-114000 UNITS PO CPEP
72000.0000 [IU] | ORAL_CAPSULE | Freq: Three times a day (TID) | ORAL | Status: DC
  Administered 2020-08-06 – 2020-08-10 (×12): 72000 [IU] via ORAL
  Filled 2020-08-05 (×2): qty 2
  Filled 2020-08-05: qty 6
  Filled 2020-08-05 (×5): qty 2
  Filled 2020-08-05: qty 6
  Filled 2020-08-05 (×4): qty 2

## 2020-08-05 MED ORDER — BUPROPION HCL ER (SR) 150 MG PO TB12
150.0000 mg | ORAL_TABLET | Freq: Every day | ORAL | Status: DC
  Administered 2020-08-06 – 2020-08-10 (×5): 150 mg via ORAL
  Filled 2020-08-05 (×5): qty 1

## 2020-08-05 MED ORDER — HYDROMORPHONE HCL 1 MG/ML IJ SOLN
1.0000 mg | INTRAMUSCULAR | Status: DC | PRN
Start: 2020-08-05 — End: 2020-08-07
  Administered 2020-08-05 – 2020-08-07 (×10): 1 mg via INTRAVENOUS
  Filled 2020-08-05 (×11): qty 1

## 2020-08-05 MED ORDER — MONTELUKAST SODIUM 10 MG PO TABS
10.0000 mg | ORAL_TABLET | Freq: Every day | ORAL | Status: DC
  Administered 2020-08-05 – 2020-08-09 (×5): 10 mg via ORAL
  Filled 2020-08-05 (×5): qty 1

## 2020-08-05 MED ORDER — MELATONIN 5 MG PO TABS
10.0000 mg | ORAL_TABLET | Freq: Every day | ORAL | Status: DC
  Administered 2020-08-05 – 2020-08-09 (×5): 10 mg via ORAL
  Filled 2020-08-05 (×5): qty 2

## 2020-08-05 MED ORDER — ENOXAPARIN SODIUM 40 MG/0.4ML ~~LOC~~ SOLN
40.0000 mg | SUBCUTANEOUS | Status: DC
  Administered 2020-08-05 – 2020-08-09 (×5): 40 mg via SUBCUTANEOUS
  Filled 2020-08-05 (×5): qty 0.4

## 2020-08-05 MED ORDER — SODIUM CHLORIDE 0.9 % IV BOLUS
1000.0000 mL | Freq: Once | INTRAVENOUS | Status: AC
  Administered 2020-08-05: 1000 mL via INTRAVENOUS

## 2020-08-05 MED ORDER — ROSUVASTATIN CALCIUM 10 MG PO TABS
10.0000 mg | ORAL_TABLET | Freq: Every day | ORAL | Status: DC
  Administered 2020-08-05 – 2020-08-10 (×6): 10 mg via ORAL
  Filled 2020-08-05 (×6): qty 1

## 2020-08-05 MED ORDER — FLUTICASONE FUROATE-VILANTEROL 200-25 MCG/INH IN AEPB
1.0000 | INHALATION_SPRAY | Freq: Every day | RESPIRATORY_TRACT | Status: DC
  Administered 2020-08-06 – 2020-08-10 (×5): 1 via RESPIRATORY_TRACT

## 2020-08-05 MED ORDER — POTASSIUM CHLORIDE CRYS ER 20 MEQ PO TBCR
40.0000 meq | EXTENDED_RELEASE_TABLET | Freq: Once | ORAL | Status: AC
  Administered 2020-08-05: 40 meq via ORAL
  Filled 2020-08-05: qty 2

## 2020-08-05 MED ORDER — GABAPENTIN 300 MG PO CAPS
900.0000 mg | ORAL_CAPSULE | Freq: Two times a day (BID) | ORAL | Status: DC
  Administered 2020-08-05 – 2020-08-10 (×10): 900 mg via ORAL
  Filled 2020-08-05 (×10): qty 3

## 2020-08-05 MED ORDER — ALBUTEROL SULFATE HFA 108 (90 BASE) MCG/ACT IN AERS
2.0000 | INHALATION_SPRAY | Freq: Four times a day (QID) | RESPIRATORY_TRACT | Status: DC | PRN
  Filled 2020-08-05: qty 6.7

## 2020-08-05 MED ORDER — UMECLIDINIUM BROMIDE 62.5 MCG/INH IN AEPB
1.0000 | INHALATION_SPRAY | Freq: Every day | RESPIRATORY_TRACT | Status: DC
  Administered 2020-08-06 – 2020-08-10 (×5): 1 via RESPIRATORY_TRACT

## 2020-08-05 MED ORDER — BUPROPION HCL ER (SR) 150 MG PO TB12
300.0000 mg | ORAL_TABLET | Freq: Every day | ORAL | Status: DC
  Administered 2020-08-05 – 2020-08-09 (×5): 300 mg via ORAL
  Filled 2020-08-05 (×5): qty 2

## 2020-08-05 MED ORDER — FLUTICASONE FUROATE-VILANTEROL 200-25 MCG/INH IN AEPB
1.0000 | INHALATION_SPRAY | Freq: Every day | RESPIRATORY_TRACT | Status: DC
  Filled 2020-08-05: qty 28

## 2020-08-05 MED ORDER — HYDROMORPHONE HCL 1 MG/ML IJ SOLN
1.0000 mg | Freq: Once | INTRAMUSCULAR | Status: AC
  Administered 2020-08-05: 1 mg via INTRAVENOUS
  Filled 2020-08-05: qty 1

## 2020-08-05 MED ORDER — IOHEXOL 300 MG/ML  SOLN
100.0000 mL | Freq: Once | INTRAMUSCULAR | Status: AC | PRN
  Administered 2020-08-05: 100 mL via INTRAVENOUS

## 2020-08-05 MED ORDER — SERTRALINE HCL 50 MG PO TABS
75.0000 mg | ORAL_TABLET | Freq: Every day | ORAL | Status: DC
  Administered 2020-08-05 – 2020-08-10 (×6): 75 mg via ORAL
  Filled 2020-08-05 (×6): qty 1

## 2020-08-05 MED ORDER — QUETIAPINE FUMARATE 100 MG PO TABS
100.0000 mg | ORAL_TABLET | Freq: Every day | ORAL | Status: DC
  Administered 2020-08-05 – 2020-08-09 (×5): 100 mg via ORAL
  Filled 2020-08-05 (×5): qty 1

## 2020-08-05 MED ORDER — UMECLIDINIUM BROMIDE 62.5 MCG/INH IN AEPB
1.0000 | INHALATION_SPRAY | Freq: Every day | RESPIRATORY_TRACT | Status: DC
  Filled 2020-08-05: qty 7

## 2020-08-05 MED ORDER — MELATONIN 10 MG PO TABS: 10.0000 mg | ORAL_TABLET | Freq: Every day | ORAL | Status: DC

## 2020-08-05 MED ORDER — PROCHLORPERAZINE EDISYLATE 10 MG/2ML IJ SOLN
10.0000 mg | Freq: Four times a day (QID) | INTRAMUSCULAR | Status: DC | PRN
  Administered 2020-08-05 – 2020-08-09 (×2): 10 mg via INTRAVENOUS
  Filled 2020-08-05 (×2): qty 2

## 2020-08-05 MED ORDER — BUPROPION HCL ER (SR) 150 MG PO TB12: 150.0000 mg | ORAL_TABLET | ORAL | Status: DC

## 2020-08-05 MED ORDER — PANTOPRAZOLE SODIUM 40 MG PO TBEC
80.0000 mg | DELAYED_RELEASE_TABLET | Freq: Every day | ORAL | Status: DC
  Administered 2020-08-05 – 2020-08-10 (×6): 80 mg via ORAL
  Filled 2020-08-05 (×6): qty 2

## 2020-08-05 MED ORDER — POLYETHYLENE GLYCOL 3350 17 G PO PACK: 17.0000 g | PACK | Freq: Every day | ORAL | Status: DC | PRN

## 2020-08-05 MED ORDER — OXYCODONE HCL 5 MG PO TABS
15.0000 mg | ORAL_TABLET | ORAL | Status: DC | PRN
Start: 2020-08-05 — End: 2020-08-05

## 2020-08-05 MED ORDER — ONDANSETRON HCL 4 MG/2ML IJ SOLN
4.0000 mg | Freq: Once | INTRAMUSCULAR | Status: AC
  Administered 2020-08-05: 4 mg via INTRAVENOUS
  Filled 2020-08-05: qty 2

## 2020-08-05 MED ORDER — OXYCODONE HCL 5 MG PO TABS
15.0000 mg | ORAL_TABLET | ORAL | Status: DC | PRN
  Administered 2020-08-06 – 2020-08-10 (×21): 15 mg via ORAL
  Filled 2020-08-05 (×22): qty 3

## 2020-08-05 MED ORDER — METOPROLOL SUCCINATE ER 50 MG PO TB24
50.0000 mg | ORAL_TABLET | Freq: Every day | ORAL | Status: DC
  Administered 2020-08-05 – 2020-08-10 (×6): 50 mg via ORAL
  Filled 2020-08-05 (×6): qty 1

## 2020-08-05 MED ORDER — LACTATED RINGERS IV SOLN: INTRAVENOUS | Status: DC

## 2020-08-05 NOTE — H&P (Signed)
History and Physical    Kara Lewmanda Toole WUJ:[id number] DOB: 01/12/1986 DOA: 08/05/2020  PCP: Daisy Florooss, Charles Alan, MD  Patient coming from: Home  Chief Complaint: Abdominal pain, nausea, vomiting  HPI: Kara [name] is a 35 y.o. 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  She states that she has flareups 2-3 times yearly.  She states that last week, she suffered from a viral GI illness, since then she has had worsening epigastric abdominal pain that radiates to the left upper quadrant.  This is consistent with her previous episodes of pancreatitis, pain is sharp in nature, associated with nausea and vomiting.  She has not been able to keep down her oral medications at home.  She denies any fevers or chills, chest pain or shortness of breath.  ED Course: Patient was found to be elevated.  CT abdomen pelvis which showed acute pancreatitis with inflammatory phlegmon, new cystic lesion in the head of the pancreas, pseudocyst.  Review of Systems: As per HPI. Otherwise, all other review of systems reviewed and are negative.   Past Medical History:  Diagnosis Date  . Alcoholism in remission (HCC)    per pt in remission since 2017  . Anemia   . AR (allergic rhinitis)    allergist-- dr Lucie Leatherkozlow  . Biliary dyskinesia   . Diastolic CHF (HCC)    followd by dr Demetrius Charityp. Eden Emmsnishan  . Environmental allergies   . GERD (gastroesophageal reflux disease)   . Headache   . History of acute pancreatitis    alcoholic pancreatitis ,  2016 and 2017  . History of sepsis 06/12/2016   with CAP  . History of syncope   . 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 hematoma due to coumadin treated with IR embolization of the IMA branches  . Hypertension   . Polyneuropathy   . Prolonged QT  syndrome    cardiologist-- dr Eden Emmsnishan (notes in epic)  . Severe persistent asthma    pulmonologist-- dr dr Belva Cromep. mannam (notes in epic)    Past Surgical History:  Procedure Laterality Date  . CHOLECYSTECTOMY N/A 06/21/2018   Procedure: LAPAROSCOPIC CHOLECYSTECTOMY;  Surgeon: Abigail MiyamotoBlackman, Douglas, MD;  Location: [facility];  Service: General;  Laterality: N/A;  . ESOPHAGOGASTRODUODENOSCOPY N/A 07/17/2014   Procedure: ESOPHAGOGASTRODUODENOSCOPY (EGD);  Surgeon: Dorena CookeyJohn Hayes, MD;  [facility];  Service: Endoscopy;  Laterality: N/A;  . TYMPANOSTOMY TUBE PLACEMENT Bilateral 11 MONTHS OLD  . VIDEO BRONCHOSCOPY Bilateral 10/08/2016   Procedure: VIDEO BRONCHOSCOPY WITH FLUORO;  Surgeon: Chilton GreathouseMannam, Praveen, MD;  Location: [facility];  Service: Cardiopulmonary;  Laterality: Bilateral;     reports that she has been smoking cigarettes. She has a 2.50 pack-year smoking history. She has never used smokeless tobacco. She reports previous alcohol use. She reports previous drug use.  Allergies  Allergen Reactions  . Acetaminophen Other (See Comments)    Gi Upset  . Nsaids Other (See Comments)    Upset stomach   . Peanuts [Peanut Oil] Hives and Itching    "ONLY TREE NUTS"  . Triamterene-Hctz Nausea Only    dizzy    Family History  Problem Relation Age of Onset  . Heart failure Mother   . Colon polyps Mother   . Heart failure Father   . Diabetes Father   . Heart failure Maternal Grandfather   .  Colon cancer Neg Hx      Prior to Admission medications   Medication Sig Start Date End Date Taking? Authorizing Provider  albuterol (PROVENTIL) (2.5 MG/3ML) 0.083% nebulizer solution Take 3 mLs (2.5 mg total) by nebulization every 4 (four) hours as needed for wheezing or shortness of breath. 05/03/19  Yes Ambs, Norvel Richards, FNP  albuterol (VENTOLIN HFA) 108 (90 Base) MCG/ACT inhaler 2 puffs every 4 hours as needed for coughing or wheezing. Patient taking differently: Inhale 2 puffs into the lungs every 6 (six)  hours as needed for wheezing or shortness of breath. 2 puffs every 4 hours as needed for coughing or wheezing. 06/04/19  Yes Ambs, Norvel Richards, FNP  BREO ELLIPTA 200-25 MCG/INH AEPB INHALE 1 PUFF INTO THE LUNGS DAILY 05/28/19  Yes Kozlow, Alvira Philips, MD  buPROPion [facility] SR) 150 MG 12 hr tablet Take 150-300 mg by mouth See admin instructions. Takes 1 tablet in the morning and 2 tablets at bedtime 04/10/19  Yes [provider]  chlorhexidine (PERIDEX) 0.12 % solution Use as directed 15 mLs in the mouth or throat 2 (two) times daily. After meals Patient taking differently: Use as directed 15 mLs in the mouth or throat daily as needed (dental plaque). After meals 05/03/19  Yes Ambs, Norvel Richards, FNP  Cholecalciferol (VITAMIN D PO) Take 1,000 Units by mouth daily.   Yes [provider]  DUPIXENT 300 MG/2ML prefilled syringe INJECT 300 MG SUBCUTANEOUSLY EVERY OTHER WEEK Patient taking differently: Inject 300 mg into the skin every 14 (fourteen) days. 06/02/20  Yes Kozlow, Alvira Philips, MD  EPINEPHrine (EPIPEN 2-PAK) 0.3 mg/0.3 mL IJ SOAJ injection Inject 0.3 mLs (0.3 mg total) into the muscle once as needed (for severe allergic reaction). 07/11/18  Yes Kozlow, Alvira Philips, MD  gabapentin (NEURONTIN) 300 MG capsule TAKE 3 CAPSULES BY MOUTH TWICE DAILY Patient taking differently: Take 900 mg by mouth 2 (two) times daily. 12/27/18  Yes Sater, Pearletha Furl, MD  lipase/protease/amylase (CREON) 36000 UNITS CPEP capsule Take 72,000 Units by mouth 3 (three) times daily with meals. 04/28/20  Yes [provider]  Melatonin 10 MG TABS Take 10 mg by mouth at bedtime.   Yes [provider]  metoprolol succinate (TOPROL-XL) 50 MG 24 hr tablet Take 1 tablet (50 mg total) by mouth daily. 06/11/19  Yes Wendall Stade, MD  montelukast (SINGULAIR) 10 MG tablet TAKE ONE TABLET BY MOUTH AT BEDTIME Patient taking differently: Take 10 mg by mouth at bedtime. 12/13/19  Yes Ambs, Norvel Richards, FNP  Multiple Vitamins-Minerals  (MULTIVITAMIN WITH MINERALS) tablet Take 1 tablet by mouth daily.   Yes [provider]  omeprazole (PRILOSEC) 40 MG capsule TAKE 1 CAPSULE(40 MG) BY MOUTH DAILY Patient taking differently: Take 40 mg by mouth daily. 01/08/20  Yes Ambs, Norvel Richards, FNP  oxyCODONE (ROXICODONE) 15 MG immediate release tablet Take 1 tablet (15 mg total) by mouth every 4 (four) hours as needed for severe pain or breakthrough pain. 11/12/19  Yes Zannie Cove, MD  QUEtiapine (SEROQUEL) 100 MG tablet Take 100 mg by mouth at bedtime. 03/23/19  Yes [provider]  rosuvastatin (CRESTOR) 10 MG tablet Take 10 mg by mouth daily.   Yes [provider]  sertraline (ZOLOFT) 50 MG tablet Take 75 mg by mouth daily.   Yes [provider]  spironolactone (ALDACTONE) 25 MG tablet Take 25 mg by mouth daily as needed (leg swelling). 11/14/19  Yes [provider]  thiamine 100 MG tablet Take  100 mg by mouth daily.   Yes [provider]  ondansetron (ZOFRAN ODT) 8 MG disintegrating tablet Take 1 tablet (8 mg total) by mouth every 8 (eight) hours as needed for nausea or vomiting. Patient not taking: No sig reported 11/12/19   Zannie Cove, MD  umeclidinium bromide (INCRUSE ELLIPTA) 62.5 MCG/INH AEPB Inhale 1 puff into the lungs daily. Patient not taking: Reported on 08/05/2020 05/03/19   Hetty Blend, FNP    Physical Exam: Vitals:   08/05/20 1330 08/05/20 1434 08/05/20 1500 08/05/20 1600  BP: 123/79 111/72 109/67 109/72  Pulse: 95 85 89 90  Resp: 18 13 13 20   Temp:      TempSrc:      SpO2: 95% 94% 99% 100%  Weight:      Height:          Constitutional: NAD, calm, comfortable Eyes: PERRL, lids and conjunctivae normal ENMT: Mucous membranes are moist. Normal dentition.  Respiratory: Clear to auscultation bilaterally, no wheezing, no crackles. Normal respiratory effort. No accessory muscle use. No conversational dyspnea  Cardiovascular: Regular rate and rhythm, no murmurs. No  extremity edema.  Abdomen: Soft, nondistended, mildly tender to palpation epigastric region. Bowel sounds positive.  Musculoskeletal: No joint deformity upper and lower extremities. No contractures. Normal muscle tone.  Skin: no rashes, lesions, ulcers on exposed skin  Neurologic: Alert and oriented, speech fluent, CN 2-12 grossly intact. No focal deficits.   Psychiatric: Normal judgment and insight. Normal mood and affect   Labs on Admission: I have personally reviewed following labs and imaging studies  CBC: Recent Labs  Lab 08/05/20 0836  WBC 13.3*  NEUTROABS 10.1*  HGB 14.3  HCT 42.2  MCV 89.8  PLT 340   Basic Metabolic Panel: Recent Labs  Lab 08/05/20 0836  NA 141  K 3.4*  CL 102  CO2 29  GLUCOSE 136*  BUN 8  CREATININE 0.68  CALCIUM 9.1   GFR: Estimated Creatinine Clearance: 106.4 mL/min (by C-G formula based on SCr of 0.68 mg/dL). Liver Function Tests: Recent Labs  Lab 08/05/20 0836  AST 19  ALT 14  ALKPHOS 129*  BILITOT 1.2  PROT 7.1  ALBUMIN 3.2*   Recent Labs  Lab 08/05/20 0836  LIPASE 146*   No results for input(s): AMMONIA in the last 168 hours. Coagulation Profile: No results for input(s): INR, PROTIME in the last 168 hours. Cardiac Enzymes: No results for input(s): CKTOTAL, CKMB, CKMBINDEX, TROPONINI in the last 168 hours. BNP (last 3 results) No results for input(s): PROBNP in the last 8760 hours. HbA1C: No results for input(s): HGBA1C in the last 72 hours. CBG: No results for input(s): GLUCAP in the last 168 hours. Lipid Profile: No results for input(s): CHOL, HDL, LDLCALC, TRIG, CHOLHDL, LDLDIRECT in the last 72 hours. Thyroid Function Tests: No results for input(s): TSH, T4TOTAL, FREET4, T3FREE, THYROIDAB in the last 72 hours. Anemia Panel: No results for input(s): VITAMINB12, FOLATE, FERRITIN, TIBC, IRON, RETICCTPCT in the last 72 hours. Urine analysis:    Component Value Date/Time   COLORURINE AMBER (A) 08/05/2020 1515    APPEARANCEUR CLOUDY (A) 08/05/2020 1515   LABSPEC 1.033 (H) 08/05/2020 1515   PHURINE 6.0 08/05/2020 1515   GLUCOSEU NEGATIVE 08/05/2020 1515   HGBUR NEGATIVE 08/05/2020 1515   BILIRUBINUR SMALL (A) 08/05/2020 1515   KETONESUR 20 (A) 08/05/2020 1515   PROTEINUR 100 (A) 08/05/2020 1515   UROBILINOGEN 1.0 02/16/2015 1231   NITRITE NEGATIVE 08/05/2020 1515   LEUKOCYTESUR TRACE (A) 08/05/2020  1515   Sepsis Labs: !!!!!!!!!!!!!!!!!!!!!!!!!!!!!!!!!!!!!!!!!!!! (procalcitonin:4,lacticidven:4) )No results found for this or any previous visit (from the past 240 hour(s)).   Radiological Exams on Admission: CT ABDOMEN PELVIS W CONTRAST  Result Date: 08/05/2020 CLINICAL DATA:  Acute abdominal pain on the left for several days EXAM: CT ABDOMEN AND PELVIS WITH CONTRAST TECHNIQUE: Multidetector CT imaging of the abdomen and pelvis was performed using the standard protocol following bolus administration of intravenous contrast. CONTRAST:  OMNIPAQUE IOHEXOL 300 MG/ML  SOLN COMPARISON:  11/06/2019, MRI from 02/06/2020 FINDINGS: Lower chest: No acute abnormality. Hepatobiliary: Fatty infiltration of the liver is noted. Gallbladder has been surgically removed. Pancreas: Pancreas is well visualized and demonstrates interval development of a large cystic lesion in the head of the pancreas which measures approximately 3.2 cm in greatest dimension. This has increased in size when compare with the prior MRI examination at which time it measured 1.7 cm in greatest dimension. The previously seen cystic lesions in the body and tail of the pancreas are not as well appreciated on this exam. Additionally some mild peripancreatic inflammatory changes and fluid are seen. This extends inferiorly along the anterior aspect of Gerota's fascia on the left. A pseudocyst is noted measuring 4 cm in dimension stable in appearance from the prior exams. Spleen: Normal in size without focal abnormality. Adrenals/Urinary Tract:  Adrenal glands are within normal limits. Kidneys demonstrate a normal enhancement pattern bilaterally. No renal calculi are seen. No obstructive changes are noted. Bladder is decompressed. Stomach/Bowel: No obstructive or inflammatory changes of the colon are seen. The appendix is not well visualized. No obstructive changes of the small bowel are noted. The stomach is within normal limits. Vascular/Lymphatic: Aortic atherosclerosis. No enlarged abdominal or pelvic lymph nodes. Reproductive: Uterus and bilateral adnexa are unremarkable. Other: Mild free fluid is noted within the pelvis related to the pancreatitis. No herniation is seen. Musculoskeletal: Degenerative changes of the lumbar spine are noted. IMPRESSION: Changes consistent with acute pancreatitis with inflammatory phlegmon extending inferiorly along the anterior aspect of the abdominal aorta and Gerota's fascia on the left. New cystic lesion in the head of the pancreas increased in size as described when compared with the prior MRI. Pseudocyst is noted along the inferior aspect of Gerota's fascia anteriorly stable in appearance from the prior CT and MRI examination. Fatty liver. Electronically Signed   By: Alcide Clever M.D.   On: 08/05/2020 16:26    EKG: Independently reviewed.  Normal sinus rhythm with QTC 519  Assessment/Plan Principal Problem:   Acute on chronic pancreatitis (HCC) Active Problems:   Prolonged QT interval   Gastroesophageal reflux disease   Asthma, severe persistent, well-controlled   Fatty liver   Pancreatic pseudocyst   Depression   Acute on chronic pancreatitis -Followed by [facility].  Chronic pancreatitis has been ongoing since 2016, initially attributed to alcohol use, is currently on oxycodone as an outpatient, managed by [facility] and considered for spinal cord stimulator.  She is status post cholecystectomy -CT abdomen pelvis changes consistent with acute pancreatitis with inflammatory phlegmon, new  cystic lesion in head of pancreas, pseudocyst stable in appearance   -Lipase elevated 146 -Supportive care, pain control, antiemetic, IV fluid -[facility] consulted to see in AM -Continue Creon  Hypokalemia -Replace, trend  QT interval prolongation -Avoid QTC prolonging agents  Asthma -Albuterol as needed, Breo, Incruse Ellipta  Depression -Continue Wellbutrin Seroquel, Zoloft  Chronic pain syndrome -Continue Neurontin, oxycodone  Hyperlipidemia -Continue Crestor    DVT prophylaxis: Lovenox  Code Status: Full code Family Communication: None at bedside  Disposition Plan: Discharge back to home setting once clinically improved  Consults called: GI, [place], for non-urgent consult in AM    Severity of Illness: The appropriate patient status for this patient is INPATIENT. Inpatient status is judged to be reasonable and necessary in order to provide the required intensity of service to ensure the patient's safety. The patient's presenting symptoms, physical exam findings, and initial radiographic and laboratory data in the context of their chronic comorbidities is felt to place them at high risk for further clinical deterioration. Furthermore, it is not anticipated that the patient will be medically stable for discharge from the hospital within 2 midnights of admission.   * I certify that at the point of admission it is my clinical judgment that the patient will require inpatient hospital care spanning beyond 2 midnights from the point of admission due to high intensity of service, high risk for further deterioration and high frequency of surveillance required.Noralee Stain, DO [facility] 08/05/2020, 4:49 PM   Available via Epic secure chat 7am-7pm After these hours, please refer to coverage provider listed on [email or web address]

## 2020-08-05 NOTE — ED Notes (Signed)
Pt stated that she cannot urinate. Has not been able to drink in several days.

## 2020-08-05 NOTE — ED Provider Notes (Signed)
[facility]-EMERGENCY DEPT Provider Note   CSN: [id number] Arrival date & time: 08/05/20  0744     History Chief Complaint  Patient presents with  . Nausea  . Emesis    Kara [name] is a 35 y.o. female.  The history is provided by the patient. No language interpreter was used.  Emesis Severity:  Severe Timing:  Constant Number of daily episodes:  Multiple Progression:  Worsening Chronicity:  New Recent urination:  Normal Relieved by:  Nothing Worsened by:  Nothing Ineffective treatments:  Antiemetics Associated symptoms: abdominal pain   Risk factors: no alcohol use and no sick contacts        Past Medical History:  Diagnosis Date  . Alcoholism in remission (HCC)    per pt in remission since 2017  . Anemia   . AR (allergic rhinitis)    allergist-- dr Lucie Leather  . Biliary dyskinesia   . Diastolic CHF (HCC)    followd by dr Demetrius Charity. Eden Emms  . Environmental allergies   . GERD (gastroesophageal reflux disease)   . Headache   . History of acute pancreatitis    alcoholic pancreatitis ,  2016 and 2017  . History of sepsis 06/12/2016   with CAP  . History of syncope   . 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 hematoma due to coumadin treated with IR embolization of the IMA branches  . Hypertension   . Polyneuropathy   . Prolonged QT syndrome    cardiologist-- dr Eden Emms (notes in epic)  . Severe persistent asthma    pulmonologist-- dr dr Belva Crome (notes in epic)    Patient Active Problem List   Diagnosis Date Noted  . Acute on chronic pancreatitis (HCC) 11/06/2019  . Elevated alkaline phosphatase level   . Malnutrition of moderate degree 10/23/2019  . Pancreatic pseudocyst   . Pancreatic cyst   . Fatty liver   . Other allergic rhinitis 05/03/2019  . Food allergy 05/03/2019  . Tobacco smoker, less than 10 cigarettes per day 05/03/2019  . Asthma, severe persistent,  well-controlled 11/11/2017  . Lung infiltrate   . Hyponatremia 08/27/2016  . Hyperglycemia 08/27/2016  . Acute respiratory failure with hypoxia (HCC) 08/26/2016  . HCAP (healthcare-associated pneumonia) 07/31/2016  . Tobacco abuse 07/31/2016  . Respiratory failure with hypoxia (HCC) 07/31/2016  . CAP (community acquired pneumonia) 06/13/2016  . Asthma exacerbation 06/12/2016  . Alcohol abuse, in remission 06/12/2016  . Sepsis (HCC) 06/12/2016  . Pancreatitis 09/09/2015  . Gastroesophageal reflux disease 09/09/2015  . Benign essential HTN 09/09/2015  . FUO (fever of unknown origin)   . Abdominal hematoma 02/10/2015  . Warfarin-induced coagulopathy (HCC) 02/10/2015  . Hypertension 02/10/2015  . Hypoalbuminemia due to protein-calorie malnutrition (HCC) 02/10/2015  . Alcoholic pancreatitis 02/10/2015  . Acute blood loss anemia   . Intra-abdominal hematoma 02/09/2015  . Pancreatitis, acute   . 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 Acute pancreatitis 01/29/2015  . Sinus tachycardia 01/29/2015  . Leukocytosis 01/29/2015  . AKI (acute kidney injury) (HCC) 01/29/2015  . Insomnia 10/29/2014  . Polyneuropathy 09/02/2014  . History of alcohol abuse 09/02/2014  . Edema 09/02/2014  . Neck pain 09/02/2014  . Chronic lower back pain 09/02/2014  . Numbness of foot 08/02/2014  . Hand numbness 08/02/2014  . Ataxic gait 08/02/2014  . Proximal leg weakness 08/02/2014  . Constipation   . Abdominal pain 07/16/2014  . Hypothermia 07/16/2014  . Upper abdominal pain   .  Abdominal pain, epigastric 07/06/2014  . Obesity (BMI 30-39.9) 07/06/2014  . Chronic diastolic heart failure (HCC) 07/06/2014  . Syncope 04/08/2014  . Prolonged Q-T interval on ECG 04/08/2014  . Hypokalemia 04/08/2014  . Elevated LFTs 04/08/2014    Past Surgical History:  Procedure Laterality Date  . CHOLECYSTECTOMY N/A 06/21/2018   Procedure: LAPAROSCOPIC CHOLECYSTECTOMY;  Surgeon: Abigail Miyamoto, MD;  Location: [facility];   Service: General;  Laterality: N/A;  . ESOPHAGOGASTRODUODENOSCOPY N/A 07/17/2014   Procedure: ESOPHAGOGASTRODUODENOSCOPY (EGD);  Surgeon: Dorena Cookey, MD;  Location: [facility];  Service: Endoscopy;  Laterality: N/A;  . TYMPANOSTOMY TUBE PLACEMENT Bilateral 11 MONTHS OLD  . VIDEO BRONCHOSCOPY Bilateral 10/08/2016   Procedure: VIDEO BRONCHOSCOPY WITH FLUORO;  Surgeon: Chilton Greathouse, MD;  Location: [facility];  Service: Cardiopulmonary;  Laterality: Bilateral;     OB History   No obstetric history on file.     Family History  Problem Relation Age of Onset  . Heart failure Mother   . Colon polyps Mother   . Heart failure Father   . Diabetes Father   . Heart failure Maternal Grandfather   . Colon cancer Neg Hx     Social History   Tobacco Use  . Smoking status: Current Every Day Smoker    Packs/day: 0.50    Years: 5.00    Pack years: 2.50    Types: Cigarettes  . Smokeless tobacco: Never Used  Vaping Use  . Vaping Use: Never used  Substance Use Topics  . Alcohol use: Not Currently    Alcohol/week: 0.0 standard drinks    Comment: pt hx alcohol abuse , in remission since 2017  . 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 Authorizing Provider  albuterol (PROVENTIL) (2.5 MG/3ML) 0.083% nebulizer solution Take 3 mLs (2.5 mg total) by nebulization every 4 (four) hours as needed for wheezing or shortness of breath. 05/03/19  Yes Ambs, Norvel Richards, FNP  albuterol (VENTOLIN HFA) 108 (90 Base) MCG/ACT inhaler 2 puffs every 4 hours as needed for coughing or wheezing. Patient taking differently: Inhale 2 puffs into the lungs every 6 (six) hours as needed for wheezing or shortness of breath. 2 puffs every 4 hours as needed for coughing or wheezing. 06/04/19  Yes Ambs, Norvel Richards, FNP  BREO ELLIPTA 200-25 MCG/INH AEPB INHALE 1 PUFF INTO THE LUNGS DAILY 05/28/19  Yes Kozlow,  Alvira Philips, MD  buPROPion [facility] SR) 150 MG 12 hr tablet Take 150-300 mg by mouth See admin instructions. Takes 1 tablet in the morning and 2 tablets at bedtime 04/10/19  Yes [provider]  chlorhexidine (PERIDEX) 0.12 % solution Use as directed 15 mLs in the mouth or throat 2 (two) times daily. After meals Patient taking differently: Use as directed 15 mLs in the mouth or throat daily as needed (dental plaque). After meals 05/03/19  Yes Ambs, Norvel Richards, FNP  Cholecalciferol (VITAMIN D PO) Take 1,000 Units by mouth daily.   Yes [provider]  DUPIXENT 300 MG/2ML prefilled syringe INJECT 300 MG SUBCUTANEOUSLY EVERY OTHER WEEK Patient taking differently: Inject 300 mg into the skin every 14 (fourteen) days. 06/02/20  Yes Kozlow, Alvira Philips, MD  EPINEPHrine (EPIPEN 2-PAK) 0.3 mg/0.3 mL IJ SOAJ injection Inject 0.3 mLs (0.3 mg total) into the muscle once as needed (for severe allergic reaction). 07/11/18  Yes Kozlow, Alvira Philips, MD  gabapentin (NEURONTIN) 300 MG capsule TAKE 3 CAPSULES BY MOUTH TWICE DAILY Patient taking differently: Take 900 mg by mouth 2 (two) times daily. 12/27/18  Yes Sater, Pearletha Furl, MD  lipase/protease/amylase (CREON) 36000 UNITS CPEP capsule Take 72,000 Units by mouth 3 (three) times daily with meals. 04/28/20  Yes [provider]  Melatonin 10 MG TABS Take 10 mg by mouth at bedtime.   Yes [provider]  metoprolol succinate (TOPROL-XL) 50 MG 24 hr tablet Take 1 tablet (50 mg total) by mouth daily. 06/11/19  Yes Wendall Stade, MD  montelukast (SINGULAIR) 10 MG tablet TAKE ONE TABLET BY MOUTH AT BEDTIME Patient taking differently: Take 10 mg by mouth at bedtime. 12/13/19  Yes Ambs, Norvel Richards, FNP  Multiple Vitamins-Minerals (MULTIVITAMIN WITH MINERALS) tablet Take 1 tablet by mouth daily.   Yes [provider]  omeprazole (PRILOSEC) 40 MG capsule TAKE 1 CAPSULE(40 MG) BY MOUTH DAILY Patient taking differently: Take 40 mg by mouth daily. 01/08/20   Yes Ambs, Norvel Richards, FNP  oxyCODONE (ROXICODONE) 15 MG immediate release tablet Take 1 tablet (15 mg total) by mouth every 4 (four) hours as needed for severe pain or breakthrough pain. 11/12/19  Yes Zannie Cove, MD  QUEtiapine (SEROQUEL) 100 MG tablet Take 100 mg by mouth at bedtime. 03/23/19  Yes [provider]  rosuvastatin (CRESTOR) 10 MG tablet Take 10 mg by mouth daily.   Yes [provider]  sertraline (ZOLOFT) 50 MG tablet Take 75 mg by mouth daily.   Yes [provider]  spironolactone (ALDACTONE) 25 MG tablet Take 25 mg by mouth daily as needed (leg swelling). 11/14/19  Yes [provider]  thiamine 100 MG tablet Take 100 mg by mouth daily.   Yes [provider]  ondansetron (ZOFRAN ODT) 8 MG disintegrating tablet Take 1 tablet (8 mg total) by mouth every 8 (eight) hours as needed for nausea or vomiting. Patient not taking: No sig reported 11/12/19   Zannie Cove, MD  umeclidinium bromide (INCRUSE ELLIPTA) 62.5 MCG/INH AEPB Inhale 1 puff into the lungs daily. Patient not taking: Reported on 08/05/2020 05/03/19   Hetty Blend, FNP    Allergies    Acetaminophen, Nsaids, Peanuts [peanut oil], and Triamterene-hctz  Review of Systems   Review of Systems  Gastrointestinal: Positive for abdominal pain and vomiting.  All other systems reviewed and are negative.   Physical Exam Updated Vital Signs BP (!) 124/96   Pulse 93   Temp 98.4 F (36.9 C) (Oral)   Resp (!) 29   Ht 5\' 5"  (1.651 m)   Wt 86.2 kg   SpO2 100%   BMI 31.62 kg/m   Physical Exam Vitals and nursing note reviewed.  Constitutional:      Appearance: She is well-developed.  HENT:     Head: Normocephalic.     Mouth/Throat:     Mouth: Mucous membranes are moist.  Eyes:     Extraocular Movements: Extraocular movements intact.     Pupils: Pupils are equal, round, and reactive to light.  Cardiovascular:     Rate and Rhythm: Normal rate.  Pulmonary:     Effort:  Pulmonary effort is normal.  Abdominal:     General: There is no distension.     Tenderness: There is abdominal tenderness. There is guarding.  Musculoskeletal:        General: Normal range of motion.     Cervical back: Normal range of motion.  Skin:    General:  Skin is warm.  Neurological:     General: No focal deficit present.     Mental Status: She is alert and oriented to person, place, and time.  Psychiatric:        Mood and Affect: Mood normal.     ED Results / Procedures / Treatments   Labs (all labs ordered are listed, but only abnormal results are displayed) Labs Reviewed  CBC WITH DIFFERENTIAL/PLATELET - Abnormal; Notable for the following components:      Result Value   WBC 13.3 (*)    Neutro Abs 10.1 (*)    All other components within normal limits  COMPREHENSIVE METABOLIC PANEL - Abnormal; Notable for the following components:   Potassium 3.4 (*)    Glucose, Bld 136 (*)    Albumin 3.2 (*)    Alkaline Phosphatase 129 (*)    All other components within normal limits  LIPASE, BLOOD - Abnormal; Notable for the following components:   Lipase 146 (*)    All other components within normal limits  URINALYSIS, ROUTINE W REFLEX MICROSCOPIC    EKG EKG Interpretation  Date/Time:  Tuesday August 05 2020 09:02:34 EDT Ventricular Rate:  89 PR Interval:  149 QRS Duration: 105 QT Interval:  426 QTC Calculation: 519 R Axis:   61 Text Interpretation: Sinus rhythm Prolonged QT interval normal axis No acute ischemia Confirmed by Pieter PartridgeWright, Anna ([id number]) on 08/05/2020 9:10:04 AM   Radiology No results found.  Procedures Procedures   Medications Ordered in ED Medications  sodium chloride 0.9 % bolus 1,000 mL (0 mLs Intravenous Stopped 08/05/20 1221)  HYDROmorphone (DILAUDID) injection 1 mg (1 mg Intravenous Given 08/05/20 0854)  ondansetron (ZOFRAN) injection 4 mg (4 mg Intravenous Given 08/05/20 0854)    ED Course  I have reviewed the triage vital signs and the nursing  notes.  Pertinent labs & imaging results that were available during my care of the patient were reviewed by me and considered in my medical decision making (see chart for details).    MDM Rules/Calculators/A&P                          MDM:  Lipase 146.  Wbc count 13.3.  PMP reviewed. Care everywhere records reviewed.  Pt has not been able to obtain urine.  Pt feels she needs to be hospitalized. Dr. Delford FieldWright in to see and examine pt  Ct abdomen ordered Final Clinical Impression(s) / ED Diagnoses Final diagnoses:  Alcohol-induced chronic pancreatitis [facility](HCC)    Rx / DC Orders ED Discharge Orders    None       Osie CheeksSofia, Jolon Degante K, PA-C 08/05/20 1506    Koleen DistanceWright, Anna G, MD 08/08/20 [phone]

## 2020-08-05 NOTE — ED Provider Notes (Signed)
Care assumed from Langston Masker. See her note for full H&P.   Per her note, "Kara [name] is a 35 y.o. female.  The history is provided by the patient. No language interpreter was used.  Emesis Severity:  Severe Timing:  Constant Number of daily episodes:  Multiple Progression:  Worsening Chronicity:  New Recent urination:  Normal Relieved by:  Nothing Worsened by:  Nothing Ineffective treatments:  Antiemetics Associated symptoms: abdominal pain   Risk factors: no alcohol use and no sick contacts  "  Physical Exam  BP 109/72 (BP Location: Left Arm)   Pulse 90   Temp 98.4 F (36.9 C) (Oral)   Resp 20   Ht 5\' 5"  (1.651 m)   Wt 86.2 kg   SpO2 100%   BMI 31.62 kg/m   Physical Exam Vitals and nursing note reviewed.  Constitutional:      General: She is not in acute distress.    Appearance: She is well-developed.  HENT:     Head: Normocephalic and atraumatic.  Eyes:     Conjunctiva/sclera: Conjunctivae normal.  Cardiovascular:     Rate and Rhythm: Normal rate.  Pulmonary:     Effort: Pulmonary effort is normal.  Musculoskeletal:        General: Normal range of motion.     Cervical back: Neck supple.  Skin:    General: Skin is warm and dry.  Neurological:     Mental Status: She is alert.       ED Course/Procedures     Procedures Results for orders placed or performed during the hospital encounter of 08/05/20  CBC with Differential/Platelet  Result Value Ref Range   WBC 13.3 (H) 4.0 - 10.5 K/uL   RBC 4.70 3.87 - 5.11 MIL/uL   Hemoglobin 14.3 12.0 - 15.0 g/dL   HCT 08/07/20 45.3 - 64.6 %   MCV 89.8 80.0 - 100.0 fL   MCH 30.4 26.0 - 34.0 pg   MCHC 33.9 30.0 - 36.0 g/dL   RDW 80.3 21.2 - 24.8 %   Platelets 340 150 - 400 K/uL   nRBC 0.0 0.0 - 0.2 %   Neutrophils Relative % 76 %   Neutro Abs 10.1 (H) 1.7 - 7.7 K/uL   Lymphocytes Relative 16 %   Lymphs Abs 2.1 0.7 - 4.0 K/uL   Monocytes Relative 7 %   Monocytes Absolute 0.9 0.1 - 1.0 K/uL   Eosinophils  Relative 0 %   Eosinophils Absolute 0.0 0.0 - 0.5 K/uL   Basophils Relative 1 %   Basophils Absolute 0.1 0.0 - 0.1 K/uL   Immature Granulocytes 0 %   Abs Immature Granulocytes 0.05 0.00 - 0.07 K/uL  Comprehensive metabolic panel  Result Value Ref Range   Sodium 141 135 - 145 mmol/L   Potassium 3.4 (L) 3.5 - 5.1 mmol/L   Chloride 102 98 - 111 mmol/L   CO2 29 22 - 32 mmol/L   Glucose, Bld 136 (H) 70 - 99 mg/dL   BUN 8 6 - 20 mg/dL   Creatinine, Ser 25.0 0.44 - 1.00 mg/dL   Calcium 9.1 8.9 - 0.37 mg/dL   Total Protein 7.1 6.5 - 8.1 g/dL   Albumin 3.2 (L) 3.5 - 5.0 g/dL   AST 19 15 - 41 U/L   ALT 14 0 - 44 U/L   Alkaline Phosphatase 129 (H) 38 - 126 U/L   Total Bilirubin 1.2 0.3 - 1.2 mg/dL   GFR, Estimated 04.8 >88 mL/min   Anion  gap 10 5 - 15  Lipase, blood  Result Value Ref Range   Lipase 146 (H) 11 - 51 U/L  Urinalysis, Routine w reflex microscopic Urine, Clean Catch  Result Value Ref Range   Color, Urine AMBER (A) YELLOW   APPearance CLOUDY (A) CLEAR   Specific Gravity, Urine 1.033 (H) 1.005 - 1.030   pH 6.0 5.0 - 8.0   Glucose, UA NEGATIVE NEGATIVE mg/dL   Hgb urine dipstick NEGATIVE NEGATIVE   Bilirubin Urine SMALL (A) NEGATIVE   Ketones, ur 20 (A) NEGATIVE mg/dL   Protein, ur 761 (A) NEGATIVE mg/dL   Nitrite NEGATIVE NEGATIVE   Leukocytes,Ua TRACE (A) NEGATIVE   RBC / HPF 0-5 0 - 5 RBC/hpf   WBC, UA 11-20 0 - 5 WBC/hpf   Bacteria, UA MANY (A) NONE SEEN   Squamous Epithelial / LPF 21-50 0 - 5   Mucus PRESENT    Hyaline Casts, UA PRESENT   hCG, quantitative, pregnancy  Result Value Ref Range   hCG, Beta Chain, Quant, S <1 <5 mIU/mL   CT ABDOMEN PELVIS W CONTRAST  Result Date: 08/05/2020 CLINICAL DATA:  Acute abdominal pain on the left for several days EXAM: CT ABDOMEN AND PELVIS WITH CONTRAST TECHNIQUE: Multidetector CT imaging of the abdomen and pelvis was performed using the standard protocol following bolus administration of intravenous contrast. CONTRAST:   OMNIPAQUE IOHEXOL 300 MG/ML  SOLN COMPARISON:  11/06/2019, MRI from 02/06/2020 FINDINGS: Lower chest: No acute abnormality. Hepatobiliary: Fatty infiltration of the liver is noted. Gallbladder has been surgically removed. Pancreas: Pancreas is well visualized and demonstrates interval development of a large cystic lesion in the head of the pancreas which measures approximately 3.2 cm in greatest dimension. This has increased in size when compare with the prior MRI examination at which time it measured 1.7 cm in greatest dimension. The previously seen cystic lesions in the body and tail of the pancreas are not as well appreciated on this exam. Additionally some mild peripancreatic inflammatory changes and fluid are seen. This extends inferiorly along the anterior aspect of Gerota's fascia on the left. A pseudocyst is noted measuring 4 cm in dimension stable in appearance from the prior exams. Spleen: Normal in size without focal abnormality. Adrenals/Urinary Tract: Adrenal glands are within normal limits. Kidneys demonstrate a normal enhancement pattern bilaterally. No renal calculi are seen. No obstructive changes are noted. Bladder is decompressed. Stomach/Bowel: No obstructive or inflammatory changes of the colon are seen. The appendix is not well visualized. No obstructive changes of the small bowel are noted. The stomach is within normal limits. Vascular/Lymphatic: Aortic atherosclerosis. No enlarged abdominal or pelvic lymph nodes. Reproductive: Uterus and bilateral adnexa are unremarkable. Other: Mild free fluid is noted within the pelvis related to the pancreatitis. No herniation is seen. Musculoskeletal: Degenerative changes of the lumbar spine are noted. IMPRESSION: Changes consistent with acute pancreatitis with inflammatory phlegmon extending inferiorly along the anterior aspect of the abdominal aorta and Gerota's fascia on the left. New cystic lesion in the head of the pancreas increased in size as  described when compared with the prior MRI. Pseudocyst is noted along the inferior aspect of Gerota's fascia anteriorly stable in appearance from the prior CT and MRI examination. Fatty liver. Electronically Signed   By: Alcide Clever M.D.   On: 08/05/2020 16:26     MDM   34 y/o f with chronic pancreatitis here with abd pain and recurrent pancreatitis  Lipase elevated, ct imaging confirms recurrent pancreatitis. Pt  with dehydration, persistent vomiting. She also has prolonged qtc so will require close monitoring with repeated antiemetics. Feels she is more appropriate for admission.   4:51 PM CONSULT with Dr. Alvino Chapel who accepts patient for admission. She recommends holding abx at this time      Rayne Du 08/05/20 1655    Bethann Berkshire, MD 08/05/20 2129

## 2020-08-05 NOTE — ED Notes (Signed)
Patient stated she is still not able to give a urine sample. She is requesting another bag of fluid and pain medicine. RN notified.

## 2020-08-05 NOTE — ED Triage Notes (Signed)
Pt c/o LUQ abdominal pain, nausea and vomiting x 2-3 days, states she has a Hx of Pancreatitis and feels like a flare.

## 2020-08-06 LAB — BASIC METABOLIC PANEL
Anion gap: 7 (ref 5–15)
BUN: 6 mg/dL (ref 6–20)
CO2: 28 mmol/L (ref 22–32)
Calcium: 8.5 mg/dL — ABNORMAL LOW (ref 8.9–10.3)
Chloride: 104 mmol/L (ref 98–111)
Creatinine, Ser: 0.64 mg/dL (ref 0.44–1.00)
GFR, Estimated: >60 mL/min (ref >60)
Glucose, Bld: 113 mg/dL — ABNORMAL HIGH (ref 70–99)
Potassium: 3.6 mmol/L (ref 3.5–5.1)
Sodium: 139 mmol/L (ref 135–145)

## 2020-08-06 LAB — SARS CORONAVIRUS 2 (TAT 6-24 HRS): SARS Coronavirus 2: NEGATIVE

## 2020-08-06 LAB — CBC
HCT: 34.4 % — ABNORMAL LOW (ref 36.0–46.0)
Hemoglobin: 11.2 g/dL — ABNORMAL LOW (ref 12.0–15.0)
MCH: 30.6 pg (ref 26.0–34.0)
MCHC: 32.6 g/dL (ref 30.0–36.0)
MCV: 94 fL (ref 80.0–100.0)
Platelets: 212 10*3/uL (ref 150–400)
RBC: 3.66 MIL/uL — ABNORMAL LOW (ref 3.87–5.11)
RDW: 13.1 % (ref 11.5–15.5)
WBC: 8.1 10*3/uL (ref 4.0–10.5)
nRBC: 0 % (ref 0.0–0.2)

## 2020-08-06 MED ORDER — POTASSIUM CHLORIDE CRYS ER 20 MEQ PO TBCR
40.0000 meq | EXTENDED_RELEASE_TABLET | Freq: Once | ORAL | Status: AC
  Administered 2020-08-06: 40 meq via ORAL
  Filled 2020-08-06: qty 2

## 2020-08-06 NOTE — Consult Note (Addendum)
Referring Provider:  [facility]         Primary Care Physician:  Daisy Florooss, Charles Alan, MD Primary Gastroenterologist:   Rayann [facility]Paul Jowell, [facility]. Previously Claudette HeadMalcolm Stark, MD            We were asked to see this patient for:   pancreatitis               ASSESSMENT / PLAN:   # 35 yo female with pancreatitis ( ?acute on chronic) complicated by a phlegmon, pseudocyst, and splenic vein thrombosis. Etiology presumably Etoh related ( alcoholism in remission for last few years). She was briefly followed by us late year, now followed by [facility]. Currently admitted with recurrent LUQ pain, nausea, vomiting and acute on chronic diarrhea.  --Negative IgG for autoimmune pancreatitis --[facility] reviewed October 2021 [place] done in [place] and felt there was possibly some stricturing in the neck of pancreas and atrophy in tail. Patient declined ERCP for further evaluation. Until last week she had actually been doing okay on a low fat diet and opioids prescribed by Pain Management. She is undergoing evaluation for spinal cord stimulator to help manage pain --Agree with clear liquids, supportive care.  --Dr. Lavon PaganiniNandigam will review CT scan findings. Pseudocyst, though enlarged is still small so doubt drainage is necessary or even an option.    # Steatosis. She eventually needs an US elastrography      HPI:                                                                                                                             Chief Complaint: abdominal pain, pancreatitis Karma Lewmanda Kornegay is a 35 y.o. female with a past medical history of obesity, alcoholism, pancreatitis, splenic vein thrombosis, hepatic steatosis, HTN, asthma, cholecystectomy   Marchelle Folksmanda was admitted in July 2021 with pancreatitis complicated by pseudocysts, presumably secondary to Etoh though lasix was a consideration. IgG4 was normal. She spent several days in the hospital unable to tolerate PO. Small bowel feeding tube placed but she  wasn't able to tolerate feedings and tube subsequently fell out.  Eventually she was able to tolerate PO. Follow up CT scan 11/06/19 showed decrease in size of peripancreatic fluid collections. She was discharged home but returned a week later failing outpatient pain control. Repeat CT scan showed stable to mildly improved pancreatitis and decreasing size of phlegmons with exception of a small intrapancreatic pseudocyst which had increased slightly in size but still only 1.5  X 2.0 cm. Discharged home on 7/26 on a soft diet. She was referred to pain management. Plan was for MRI in 2-3 months and possibly EUS at some point ( ? Chronic pancreatitis)  Patient had a hospital follow up 11/16/19. She was scheduled for 3 month follow up MRI which was done in October and suggesting ongoing pancreatitis with pseudocysts, possible duodenitis, narrow of splenic vein with gastric collaterals, hepatic steatosis, splenomegaly, perisplenic ascites. Patient wanted to establish  care with [facility] and has been followed there for the last several months.  Care Everywhere records reviewed. [facility] reviewed October's MRI and felt it suggested of some possible stricturing in the neck but also some atrophy in the tail of pancreas. ERCP was recommended, patient declined. She complained of loose stools. At some point she was started on pancreatic enzymes, [facility] increased dose, she was advised to follow low to moderate fat diet. . For pain management she was referred for a spinal cord stimulator.   Patient says she had been doing well for last few months. She was tolerating a low fat diet. Her pain was fairly well managed. She is being evaluated for spinal cord stimulator to help with the pain. Her chronic Last week she developed nausea, vomiting and diarrhea. Family members had the same symptoms so she thought she  Had a 'bug'. Symptoms improved then over the weekend she developed recurrent symptoms, this time with LUQ pain reminiscent of  pancreatis. The pain is radiating through to her back. It is worse with eating. She continues to have some nausea and frequent diarrhea ( worse than usual). She presented to [facility] ED yesterday. In ED she was hemodynamically stable. WBC 13, hct 42%. Lipase 146, liver chemistries normal, renal function normal. CT scan w/ contrast suggests acute pancreatitis with stable inflammatory phlegmon, new cystic lesion in HOP, increased in size from 1.7 cm to 3.2 cm since prior MRI.    PREVIOUS ENDOSCOPIC EVALUATIONS / PERTINENT STUDIES   Oct 2021 MRI --IMPRESSION: 1. Heterogeneous enhancement in the pancreas, with cystic lesions in the pancreatic head and tail, and indistinct pancreatic margins. This is probably from continuing pancreatitis with associated pancreatic pseudocyst, and is similar to prior. Pancreatic neoplasm is not excluded given the highly complex appearance of the pancreas. I would suggest future followup by CT rather than MRI given the better spatial resolution and reduced motion artifact that CT achieves. 2. Abnormal enhancement in the duodenal bulb and descending duodenum, probably from secondary inflammation, less likely from duodenitis. 3. Complex fluid collection below the left kidney is stable in size from the CT of 11/06/2019, probably a pseudocyst. 4. Narrowing of the splenic vein, with gastric collaterals, likely from portal venous hypertension. Gastric varices noted anteriorly. 5. Geographic and infiltrative hepatic steatosis most confluent posteriorly in the right hepatic lobe. 6. Mild splenomegaly. 7. Intervally resolved left pleural effusion. 8. 3.4 by 2.5 cm left ovarian cyst. Probable gastritis. Band of enhancement extending from the central pancreas up towards the gastric wall, probably pseudo cyst or conceivably a site of prior transgastric drainage of the pancreas, correlate with operative history. 9. Perisplenic ascites with some enhancement along the  margins, likely indicating local inflammation related to prior pancreatitis. 10. Despite efforts by the technologist and patient, motion artifact is present on today's exam and could not be eliminated. This reduces exam sensitivity and specificity.  08/06/19 CT scan w/ contrast Changes consistent with acute pancreatitis with inflammatory phlegmon extending inferiorly along the anterior aspect of the abdominal aorta and Gerota's fascia on the left.  New cystic lesion in the head of the pancreas increased in size as described when compared with the prior MRI.  Pseudocyst is noted along the inferior aspect of Gerota's fascia anteriorly stable in appearance from the prior CT and MRI examination.  Fatty liver.   Past Medical History:  Diagnosis Date  . Alcoholism in remission (HCC)    per pt in remission since 2017  . Anemia   . AR (  allergic rhinitis)    allergist-- dr Lucie Leather  . Biliary dyskinesia   . Diastolic CHF (HCC)    followd by dr Demetrius Charity. Eden Emms  . Environmental allergies   . GERD (gastroesophageal reflux disease)   . Headache   . History of acute pancreatitis    alcoholic pancreatitis ,  2016 and 2017  . History of sepsis 06/12/2016   with CAP  . History of syncope   . 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 hematoma due to coumadin treated with IR embolization of the IMA branches  . Hypertension   . Polyneuropathy   . Prolonged QT syndrome    cardiologist-- dr Eden Emms (notes in epic)  . Severe persistent asthma    pulmonologist-- dr dr Belva Crome (notes in epic)    Past Surgical History:  Procedure Laterality Date  . CHOLECYSTECTOMY N/A 06/21/2018   Procedure: LAPAROSCOPIC CHOLECYSTECTOMY;  Surgeon: Abigail Miyamoto, MD;  Location: [facility];  Service: General;  Laterality: N/A;  . ESOPHAGOGASTRODUODENOSCOPY N/A 07/17/2014   Procedure: ESOPHAGOGASTRODUODENOSCOPY (EGD);  Surgeon: Dorena Cookey, MD;   Location: [facility];  Service: Endoscopy;  Laterality: N/A;  . TYMPANOSTOMY TUBE PLACEMENT Bilateral 11 MONTHS OLD  . VIDEO BRONCHOSCOPY Bilateral 10/08/2016   Procedure: VIDEO BRONCHOSCOPY WITH FLUORO;  Surgeon: Chilton Greathouse, MD;  Location: [facility];  Service: Cardiopulmonary;  Laterality: Bilateral;    Prior to Admission medications   Medication Sig Start Date End Date Taking? Authorizing Provider  albuterol (PROVENTIL) (2.5 MG/3ML) 0.083% nebulizer solution Take 3 mLs (2.5 mg total) by nebulization every 4 (four) hours as needed for wheezing or shortness of breath. 05/03/19  Yes Ambs, Norvel Richards, FNP  albuterol (VENTOLIN HFA) 108 (90 Base) MCG/ACT inhaler 2 puffs every 4 hours as needed for coughing or wheezing. Patient taking differently: Inhale 2 puffs into the lungs every 6 (six) hours as needed for wheezing or shortness of breath. 2 puffs every 4 hours as needed for coughing or wheezing. 06/04/19  Yes Ambs, Norvel Richards, FNP  BREO ELLIPTA 200-25 MCG/INH AEPB INHALE 1 PUFF INTO THE LUNGS DAILY 05/28/19  Yes Kozlow, Alvira Philips, MD  buPROPion [facility] SR) 150 MG 12 hr tablet Take 150-300 mg by mouth See admin instructions. Takes 1 tablet in the morning and 2 tablets at bedtime 04/10/19  Yes [provider]  chlorhexidine (PERIDEX) 0.12 % solution Use as directed 15 mLs in the mouth or throat 2 (two) times daily. After meals Patient taking differently: Use as directed 15 mLs in the mouth or throat daily as needed (dental plaque). After meals 05/03/19  Yes Ambs, Norvel Richards, FNP  Cholecalciferol (VITAMIN D PO) Take 1,000 Units by mouth daily.   Yes [provider]  DUPIXENT 300 MG/2ML prefilled syringe INJECT 300 MG SUBCUTANEOUSLY EVERY OTHER WEEK Patient taking differently: Inject 300 mg into the skin every 14 (fourteen) days. 06/02/20  Yes Kozlow, Alvira Philips, MD  EPINEPHrine (EPIPEN 2-PAK) 0.3 mg/0.3 mL IJ SOAJ injection Inject 0.3 mLs (0.3 mg total) into the muscle once as needed (for severe  allergic reaction). 07/11/18  Yes Kozlow, Alvira Philips, MD  gabapentin (NEURONTIN) 300 MG capsule TAKE 3 CAPSULES BY MOUTH TWICE DAILY Patient taking differently: Take 900 mg by mouth 2 (two) times daily. 12/27/18  Yes Sater, Pearletha Furl, MD  lipase/protease/amylase (CREON) 36000 UNITS CPEP capsule Take 72,000 Units by mouth 3 (three) times daily with meals. 04/28/20  Yes [provider]  Melatonin 10 MG TABS Take  10 mg by mouth at bedtime.   Yes [provider]  metoprolol succinate (TOPROL-XL) 50 MG 24 hr tablet Take 1 tablet (50 mg total) by mouth daily. 06/11/19  Yes Wendall Stade, MD  montelukast (SINGULAIR) 10 MG tablet TAKE ONE TABLET BY MOUTH AT BEDTIME Patient taking differently: Take 10 mg by mouth at bedtime. 12/13/19  Yes Ambs, Norvel Richards, FNP  Multiple Vitamins-Minerals (MULTIVITAMIN WITH MINERALS) tablet Take 1 tablet by mouth daily.   Yes [provider]  omeprazole (PRILOSEC) 40 MG capsule TAKE 1 CAPSULE(40 MG) BY MOUTH DAILY Patient taking differently: Take 40 mg by mouth daily. 01/08/20  Yes Ambs, Norvel Richards, FNP  oxyCODONE (ROXICODONE) 15 MG immediate release tablet Take 1 tablet (15 mg total) by mouth every 4 (four) hours as needed for severe pain or breakthrough pain. 11/12/19  Yes Zannie Cove, MD  QUEtiapine (SEROQUEL) 100 MG tablet Take 100 mg by mouth at bedtime. 03/23/19  Yes [provider]  rosuvastatin (CRESTOR) 10 MG tablet Take 10 mg by mouth daily.   Yes [provider]  sertraline (ZOLOFT) 50 MG tablet Take 75 mg by mouth daily.   Yes [provider]  spironolactone (ALDACTONE) 25 MG tablet Take 25 mg by mouth daily as needed (leg swelling). 11/14/19  Yes [provider]  thiamine 100 MG tablet Take 100 mg by mouth daily.   Yes [provider]    Current Facility-Administered Medications  Medication Dose Route Frequency Provider Last Rate Last Admin  . albuterol (VENTOLIN HFA) 108 (90 Base) MCG/ACT inhaler 2 puff   2 puff Inhalation Q6H PRN Noralee Stain, DO      . buPROPion [facility] SR) 12 hr tablet 150 mg  150 mg Oral Daily Noralee Stain, DO      . buPROPion [facility] SR) 12 hr tablet 300 mg  300 mg Oral QHS Noralee Stain, DO   300 mg at 08/05/20 2233  . enoxaparin (LOVENOX) injection 40 mg  40 mg Subcutaneous Q24H Noralee Stain, DO   40 mg at 08/05/20 2232  . fluticasone furoate-vilanterol (BREO ELLIPTA) 200-25 MCG/INH 1 puff  1 puff Inhalation Daily Noralee Stain, DO   1 puff at 08/06/20 0802  . gabapentin (NEURONTIN) capsule 900 mg  900 mg Oral BID Noralee Stain, DO   900 mg at 08/05/20 2233  . HYDROmorphone (DILAUDID) injection 1 mg  1 mg Intravenous Q4H PRN Noralee Stain, DO   1 mg at 08/06/20 4098  . lactated ringers infusion   Intravenous Continuous Noralee Stain, DO 150 mL/hr at 08/06/20 0847 New Bag at 08/06/20 0847  . lipase/protease/amylase (CREON) capsule 72,000 Units  72,000 Units Oral TID WC Noralee Stain, DO   72,000 Units at 08/06/20 0840  . melatonin tablet 10 mg  10 mg Oral QHS Noralee Stain, DO   10 mg at 08/05/20 2233  . metoprolol succinate (TOPROL-XL) 24 hr tablet 50 mg  50 mg Oral Daily Noralee Stain, DO   50 mg at 08/05/20 2024  . montelukast (SINGULAIR) tablet 10 mg  10 mg Oral QHS Noralee Stain, DO   10 mg at 08/05/20 2233  . oxyCODONE (Oxy IR/ROXICODONE) immediate release tablet 15 mg  15 mg Oral Q4H PRN Noralee Stain, DO      . pantoprazole (PROTONIX) EC tablet 80 mg  80 mg Oral Daily Noralee Stain, DO   80 mg at 08/05/20 2024  . polyethylene glycol (MIRALAX / GLYCOLAX) packet 17 g  17 g Oral Daily PRN Alvino Chapel,  Victorino Dike, DO      . prochlorperazine (COMPAZINE) injection 10 mg  10 mg Intravenous Q6H PRN Noralee Stain, DO   10 mg at 08/05/20 1924  . QUEtiapine (SEROQUEL) tablet 100 mg  100 mg Oral QHS Noralee Stain, DO   100 mg at 08/05/20 2233  . rosuvastatin (CRESTOR) tablet 10 mg  10 mg Oral Daily Noralee Stain, DO   10 mg at 08/05/20 2024  . sertraline  (ZOLOFT) tablet 75 mg  75 mg Oral Daily Noralee Stain, DO   75 mg at 08/05/20 2024  . umeclidinium bromide (INCRUSE ELLIPTA) 62.5 MCG/INH 1 puff  1 puff Inhalation Daily Noralee Stain, DO   1 puff at 08/06/20 0803    Allergies as of 08/05/2020 - Review Complete 08/05/2020  Allergen Reaction Noted  . Acetaminophen Other (See Comments) 01/25/2020  . Nsaids Other (See Comments) 06/13/2018  . Peanuts [peanut oil] Hives and Itching 07/06/2014  . Triamterene-hctz Nausea Only 01/25/2020    Family History  Problem Relation Age of Onset  . Heart failure Mother   . Colon polyps Mother   . Heart failure Father   . Diabetes Father   . Heart failure Maternal Grandfather   . Colon cancer Neg Hx     Social History   Socioeconomic History  . Marital status: Married    Spouse name: [name]  . Number of children: 0  . Years of education: Not on file  . Highest education level: Not on file  Occupational History  . Occupation: Engineer, production  Tobacco Use  . Smoking status: Current Every Day Smoker    Packs/day: 0.50    Years: 5.00    Pack years: 2.50    Types: Cigarettes  . Smokeless tobacco: Never Used  Vaping Use  . Vaping Use: Never used  Substance and Sexual Activity  . Alcohol use: Not Currently    Alcohol/week: 0.0 standard drinks    Comment: pt hx alcohol abuse , in remission since 2017  . Drug use: Not Currently    Comment: "POT WHEN I WAS IN HIGH SCHOOL"  . Sexual activity: Yes    Birth control/protection: None  Other Topics Concern  . Not on file  Social History Narrative   Married.   Social Determinants of Health   Financial Resource Strain: Not on file  Food Insecurity: Not on file  Transportation Needs: Not on file  Physical Activity: Not on file  Stress: Not on file  Social Connections: Not on file  Intimate Partner Violence: Not on file    Review of Systems: All systems reviewed and negative except where noted in HPI.  OBJECTIVE:    Physical  Exam: Vital signs in last 24 hours: Temp:  [98 F (36.7 C)-98.6 F (37 C)] 98.2 F (36.8 C) (04/20 0603) Pulse Rate:  [69-95] 69 (04/20 0603) Resp:  [13-29] 18 (04/20 0603) BP: (91-128)/(62-96) 105/76 (04/20 0603) SpO2:  [94 %-100 %] 99 % (04/20 0803) Last BM Date: 08/03/20 General:   Alert  female in NAD Psych:  Pleasant, cooperative. Normal mood and affect. Eyes:  Pupils equal, sclera clear, no icterus.   Conjunctiva pink. Ears:  Normal auditory acuity. Nose:  No deformity, discharge,  or lesions. Neck:  Supple; no masses Lungs:  Clear throughout to auscultation.   No wheezes, crackles, or rhonchi.  Heart:  Regular rate and rhythm; no murmurs, no lower extremity edema Abdomen:  Soft, non-distended, mild LUQ tenderness. BS active, no palp mass   Rectal:  Deferred  Msk:  Symmetrical without gross deformities. . Neurologic:  Alert and  oriented x4;  grossly normal neurologically. Skin:  Intact without significant lesions or rashes.  Filed Weights   08/05/20 0752  Weight: 86.2 kg     Scheduled inpatient medications . buPROPion  150 mg Oral Daily  . buPROPion  300 mg Oral QHS  . enoxaparin (LOVENOX) injection  40 mg Subcutaneous Q24H  . fluticasone furoate-vilanterol  1 puff Inhalation Daily  . gabapentin  900 mg Oral BID  . lipase/protease/amylase  72,000 Units Oral TID WC  . melatonin  10 mg Oral QHS  . metoprolol succinate  50 mg Oral Daily  . montelukast  10 mg Oral QHS  . pantoprazole  80 mg Oral Daily  . QUEtiapine  100 mg Oral QHS  . rosuvastatin  10 mg Oral Daily  . sertraline  75 mg Oral Daily  . umeclidinium bromide  1 puff Inhalation Daily      Intake/Output from previous day: 04/19 0701 - 04/20 0700 In: 3650 [I.V.:1650; IV Piggyback:2000] Out: -  Intake/Output this shift: No intake/output data recorded.   Lab Results: Recent Labs    08/05/20 0836 08/06/20 0522  WBC 13.3* 8.1  HGB 14.3 11.2*  HCT 42.2 34.4*  PLT 340 212   BMET Recent Labs     08/05/20 0836 08/06/20 0522  NA 141 139  K 3.4* 3.6  CL 102 104  CO2 29 28  GLUCOSE 136* 113*  BUN 8 6  CREATININE 0.68 0.64  CALCIUM 9.1 8.5*   LFT Recent Labs    08/05/20 0836  PROT 7.1  ALBUMIN 3.2*  AST 19  ALT 14  ALKPHOS 129*  BILITOT 1.2   PT/INR No results for input(s): LABPROT, INR in the last 72 hours. Hepatitis Panel No results for input(s): HEPBSAG, HCVAB, HEPAIGM, HEPBIGM in the last 72 hours.   . CBC Latest Ref Rng & Units 08/06/2020 08/05/2020 11/12/2019  WBC 4.0 - 10.5 K/uL 8.1 13.3(H) 6.9  Hemoglobin 12.0 - 15.0 g/dL 11.2(L) 14.3 9.4(L)  Hematocrit 36.0 - 46.0 % 34.4(L) 42.2 32.0(L)  Platelets 150 - 400 K/uL 212 340 356    . CMP Latest Ref Rng & Units 08/06/2020 08/05/2020 11/12/2019  Glucose 70 - 99 mg/dL 578(I) 696(E) 952(W)  BUN 6 - 20 mg/dL 6 8 <4(X)  Creatinine 3.24 - 1.00 mg/dL 4.01 0.27 2.53  Sodium 135 - 145 mmol/L 139 141 132(L)  Potassium 3.5 - 5.1 mmol/L 3.6 3.4(L) 3.4(L)  Chloride 98 - 111 mmol/L 104 102 95(L)  CO2 22 - 32 mmol/L Calcium 8.9 - 10.3 mg/dL 6.6(Y) 9.1 4.0(H)  Total Protein 6.5 - 8.1 g/dL - 7.1 6.7  Total Bilirubin 0.3 - 1.2 mg/dL - 1.2 0.3  Alkaline Phos 38 - 126 U/L - 129(H) 162(H)  AST 15 - 41 U/L - 19 15  ALT 0 - 44 U/L - 14 8   Studies/Results: CT ABDOMEN PELVIS W CONTRAST  Result Date: 08/05/2020 CLINICAL DATA:  Acute abdominal pain on the left for several days EXAM: CT ABDOMEN AND PELVIS WITH CONTRAST TECHNIQUE: Multidetector CT imaging of the abdomen and pelvis was performed using the standard protocol following bolus administration of intravenous contrast. CONTRAST:  OMNIPAQUE IOHEXOL 300 MG/ML  SOLN COMPARISON:  11/06/2019, MRI from 02/06/2020 FINDINGS: Lower chest: No acute abnormality. Hepatobiliary: Fatty infiltration of the liver is noted. Gallbladder has been surgically removed. Pancreas: Pancreas is well visualized and demonstrates interval development of a large  cystic lesion in the head  of the pancreas which measures approximately 3.2 cm in greatest dimension. This has increased in size when compare with the prior MRI examination at which time it measured 1.7 cm in greatest dimension. The previously seen cystic lesions in the body and tail of the pancreas are not as well appreciated on this exam. Additionally some mild peripancreatic inflammatory changes and fluid are seen. This extends inferiorly along the anterior aspect of Gerota's fascia on the left. A pseudocyst is noted measuring 4 cm in dimension stable in appearance from the prior exams. Spleen: Normal in size without focal abnormality. Adrenals/Urinary Tract: Adrenal glands are within normal limits. Kidneys demonstrate a normal enhancement pattern bilaterally. No renal calculi are seen. No obstructive changes are noted. Bladder is decompressed. Stomach/Bowel: No obstructive or inflammatory changes of the colon are seen. The appendix is not well visualized. No obstructive changes of the small bowel are noted. The stomach is within normal limits. Vascular/Lymphatic: Aortic atherosclerosis. No enlarged abdominal or pelvic lymph nodes. Reproductive: Uterus and bilateral adnexa are unremarkable. Other: Mild free fluid is noted within the pelvis related to the pancreatitis. No herniation is seen. Musculoskeletal: Degenerative changes of the lumbar spine are noted. IMPRESSION: Changes consistent with acute pancreatitis with inflammatory phlegmon extending inferiorly along the anterior aspect of the abdominal aorta and Gerota's fascia on the left. New cystic lesion in the head of the pancreas increased in size as described when compared with the prior MRI. Pseudocyst is noted along the inferior aspect of Gerota's fascia anteriorly stable in appearance from the prior CT and MRI examination. Fatty liver. Electronically Signed   By: Alcide Clever M.D.   On: 08/05/2020 16:26    Principal Problem:   Acute on chronic pancreatitis (HCC) Active  Problems:   Prolonged QT interval   Gastroesophageal reflux disease   Asthma, severe persistent, well-controlled   Fatty liver   Pancreatic pseudocyst   Depression    Willette Cluster, NP-C @  08/06/2020, 9:05 AM    Attending physician's note   I have taken a history, examined the patient and reviewed the chart. I agree with the Advanced Practitioner's note, impression and recommendations.  35 year old female with recurrent pancreatitis, admitted with acute on chronic pancreatitis Clinically improving, continue supportive care Continue IV fluids and clear liquids as tolerated  Do not recommend draining the pseudocyst at this point  Will need repeat imaging in 3 to 4 weeks, patient would like to follow-up with GI at [facility].   The patient was provided an opportunity to ask questions and all were answered. The patient agreed with the plan and demonstrated an understanding of the instructions.  Iona Beard , MD [phone]

## 2020-08-06 NOTE — Progress Notes (Signed)
PROGRESS NOTE    Kara [name]  NLZ:[id number] DOB: [birth year] DOA: 08/05/2020 PCP: [name], MD     Brief Narrative:  Kara [name] is a 35 y.o. 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  She states that she has flareups 2-3 times yearly.  She states that last week, she suffered from a viral GI illness, since then she has had worsening epigastric abdominal pain that radiates to the left upper quadrant.  This is consistent with her previous episodes of pancreatitis, pain is sharp in nature, associated with nausea and vomiting.  She has not been able to keep down her oral medications at home.  She denies any fevers or chills, chest pain or shortness of breath.  ED Course: Patient was found to be elevated.  CT abdomen pelvis which showed acute pancreatitis with inflammatory phlegmon, new cystic lesion in the head of the pancreas, pseudocyst.  New events last 24 hours / Subjective: States that she is feeling much better, although did require 3 doses of IV Dilaudid since admission.  Denies any nausea or vomiting.  Some tenderness in the left upper quadrant.  Admits to having an appetite and being hungry.  Assessment & Plan:   Principal Problem:   Acute on chronic pancreatitis (HCC) Active Problems:   Prolonged QT interval   Gastroesophageal reflux disease   Asthma, severe persistent, well-controlled   Fatty liver   Pancreatic pseudocyst   Depression   Acute on chronic pancreatitis -Followed by [facility].  Chronic pancreatitis has been ongoing since 2016, initially attributed to alcohol use, is currently on oxycodone as an outpatient, managed by [facility] and considered for spinal cord stimulator.  She is status post cholecystectomy -CT abdomen pelvis changes consistent with acute pancreatitis  with inflammatory phlegmon, new cystic lesion in head of pancreas, pseudocyst stable in appearance   -Lipase elevated 146 at time of admission -Continue Creon -GI consulted -Advance to clear liquid diet today  Hypokalemia -Replace, trend  QT interval prolongation -Avoid QTC prolonging agents  Asthma -Albuterol as needed, Breo, Incruse Ellipta  Depression -Continue Wellbutrin Seroquel, Zoloft  Chronic pain syndrome -Continue Neurontin, oxycodone  Hyperlipidemia -Continue Crestor    DVT prophylaxis:  enoxaparin (LOVENOX) injection 40 mg Start: 08/05/20 2200  Code Status:     Code Status Orders  (From admission, onward)         Start     Ordered   08/05/20 1809  Full code  Continuous        08/05/20 1808        Code Status History    Date Active Date Inactive Code Status Order ID Comments User Context   11/07/2019 0349 11/12/2019 1753 Full Code [id number]  Rometta Emery, MD Inpatient   10/17/2019 0608 10/31/2019 1814 Full Code [id number]  Eduard Clos, MD ED   08/26/2016 2230 09/01/2016 1646 Full Code [id number]  Eduard Clos, MD ED   07/30/2016 2241 08/02/2016 2001 Full Code [id number]  Clydie Braun, MD ED   06/12/2016 2121 06/14/2016 1928 Full Code [id number]  Therisa Doyne, MD Inpatient   12/06/2015 2354 12/07/2015 0737 Full Code [id number]  Derwood Kaplan, MD ED   09/09/2015 1654 09/11/2015 2145 Full Code [id number]  Vassie Loll, MD Inpatient   02/10/2015 0102 02/21/2015 1939 Full Code [id number]  Ron Parker, MD Inpatient  01/29/2015 1559 02/09/2015 1425 Full Code [id number]  Marinda Elk, MD ED   07/16/2014 0411 07/19/2014 1527 Full Code [id number]  Eduard Clos, MD Inpatient   07/06/2014 0339 07/06/2014 2042 Full Code [id number]  Rolly Salter, MD Inpatient   04/08/2014 1718 04/09/2014 1952 Full Code [id number]  Rama, Maryruth Bun, MD ED   Advance Care Planning Activity     Family Communication: None at  bedside Disposition Plan:  Status is: Inpatient  Remains inpatient appropriate because:IV treatments appropriate due to intensity of illness or inability to take PO and Inpatient level of care appropriate due to severity of illness   Dispo: The patient is from: Home              Anticipated d/c is to: Home              Patient currently is not medically stable to d/c.  Advance diet to clear liquids, remains on IV fluids, GI consult pending   Difficult to place patient No      Consultants:   GI  Procedures:   None  Antimicrobials:  Anti-infectives (From admission, onward)   None        Objective: Vitals:   08/05/20 2148 08/06/20 0201 08/06/20 0603 08/06/20 0803  BP: 108/84 91/62 105/76   Pulse: 87 75 69   Resp: 18 18 18    Temp: 98.6 F (37 C) 98 F (36.7 C) 98.2 F (36.8 C)   TempSrc:      SpO2: 100% 100% 100% 99%  Weight:      Height:        Intake/Output Summary (Last 24 hours) at 08/06/2020 1035 Last data filed at 08/06/2020 1009 Gross per 24 hour  Intake 4090 ml  Output --  Net 4090 ml   Filed Weights   08/05/20 0752  Weight: 86.2 kg    Examination:  General exam: Appears calm and comfortable  Respiratory system: Clear to auscultation. Respiratory effort normal. No respiratory distress. No conversational dyspnea.  Cardiovascular system: S1 & S2 heard, RRR. No murmurs. No pedal edema. Gastrointestinal system: Abdomen is nondistended, soft and mildly tender to palpation left upper quadrant Central nervous system: Alert and oriented. No focal neurological deficits. Speech clear.  Extremities: Symmetric in appearance  Skin: No rashes, lesions or ulcers on exposed skin  Psychiatry: Judgement and insight appear normal. Mood & affect appropriate.   Data Reviewed: I have personally reviewed following labs and imaging studies  CBC: Recent Labs  Lab 08/05/20 0836 08/06/20 0522  WBC 13.3* 8.1  NEUTROABS 10.1*  --   HGB 14.3 11.2*  HCT 42.2 34.4*   MCV 89.8 94.0  PLT 340 212   Basic Metabolic Panel: Recent Labs  Lab 08/05/20 0836 08/06/20 0522  NA 141 139  K 3.4* 3.6  CL 102 104  CO2 29 28  GLUCOSE 136* 113*  BUN 8 6  CREATININE 0.68 0.64  CALCIUM 9.1 8.5*   GFR: Estimated Creatinine Clearance: 106.4 mL/min (by C-G formula based on SCr of 0.64 mg/dL). Liver Function Tests: Recent Labs  Lab 08/05/20 0836  AST 19  ALT 14  ALKPHOS 129*  BILITOT 1.2  PROT 7.1  ALBUMIN 3.2*   Recent Labs  Lab 08/05/20 0836  LIPASE 146*   No results for input(s): AMMONIA in the last 168 hours. Coagulation Profile: No results for input(s): INR, PROTIME in the last 168 hours. Cardiac Enzymes: No results for input(s): CKTOTAL, CKMB, CKMBINDEX, TROPONINI in the last 168 hours.  BNP (last 3 results) No results for input(s): PROBNP in the last 8760 hours. HbA1C: No results for input(s): HGBA1C in the last 72 hours. CBG: No results for input(s): GLUCAP in the last 168 hours. Lipid Profile: No results for input(s): CHOL, HDL, LDLCALC, TRIG, CHOLHDL, LDLDIRECT in the last 72 hours. Thyroid Function Tests: No results for input(s): TSH, T4TOTAL, FREET4, T3FREE, THYROIDAB in the last 72 hours. Anemia Panel: No results for input(s): VITAMINB12, FOLATE, FERRITIN, TIBC, IRON, RETICCTPCT in the last 72 hours. Sepsis Labs: No results for input(s): PROCALCITON, LATICACIDVEN in the last 168 hours.  Recent Results (from the past 240 hour(s))  SARS CORONAVIRUS 2 (TAT 6-24 HRS) Nasopharyngeal Nasopharyngeal Swab     Status: None   Collection Time: 08/05/20  5:00 PM   Specimen: Nasopharyngeal Swab  Result Value Ref Range Status   SARS Coronavirus 2 NEGATIVE NEGATIVE Final    Comment: (NOTE) SARS-CoV-2 target nucleic acids are NOT DETECTED.  The SARS-CoV-2 RNA is generally detectable in upper and lower respiratory specimens during the acute phase of infection. Negative results do not preclude SARS-CoV-2 infection, do not rule  out co-infections with other pathogens, and should not be used as the sole basis for treatment or other patient management decisions. Negative results must be combined with clinical observations, patient history, and epidemiological information. The expected result is Negative.  Fact Sheet for Patients: HairSlick.nohttps://www.fda.gov/media/138098/download  Fact Sheet for Healthcare Providers: quierodirigir.comhttps://www.fda.gov/media/138095/download  This test is not yet approved or cleared by the [place] FDA and  has been authorized for detection and/or diagnosis of SARS-CoV-2 by FDA under an Emergency Use Authorization (EUA). This EUA will remain  in effect (meaning this test can be used) for the duration of the COVID-19 declaration under Se ction 564(b)(1) of the Act, 21 U.S.C. section 360bbb-3(b)(1), unless the authorization is terminated or revoked sooner.  Performed at [facility], [address] [place], [place] [address]       Radiology Studies: CT ABDOMEN PELVIS W CONTRAST  Result Date: 08/05/2020 CLINICAL DATA:  Acute abdominal pain on the left for several days EXAM: CT ABDOMEN AND PELVIS WITH CONTRAST TECHNIQUE: Multidetector CT imaging of the abdomen and pelvis was performed using the standard protocol following bolus administration of intravenous contrast. CONTRAST:  100mL OMNIPAQUE IOHEXOL 300 MG/ML  SOLN COMPARISON:  11/06/2019, MRI from 02/06/2020 FINDINGS: Lower chest: No acute abnormality. Hepatobiliary: Fatty infiltration of the liver is noted. Gallbladder has been surgically removed. Pancreas: Pancreas is well visualized and demonstrates interval development of a large cystic lesion in the head of the pancreas which measures approximately 3.2 cm in greatest dimension. This has increased in size when compare with the prior MRI examination at which time it measured 1.7 cm in greatest dimension. The previously seen cystic lesions in the body and tail of the pancreas are not as well  appreciated on this exam. Additionally some mild peripancreatic inflammatory changes and fluid are seen. This extends inferiorly along the anterior aspect of Gerota's fascia on the left. A pseudocyst is noted measuring 4 cm in dimension stable in appearance from the prior exams. Spleen: Normal in size without focal abnormality. Adrenals/Urinary Tract: Adrenal glands are within normal limits. Kidneys demonstrate a normal enhancement pattern bilaterally. No renal calculi are seen. No obstructive changes are noted. Bladder is decompressed. Stomach/Bowel: No obstructive or inflammatory changes of the colon are seen. The appendix is not well visualized. No obstructive changes of the small bowel are noted. The stomach is within normal limits.  Vascular/Lymphatic: Aortic atherosclerosis. No enlarged abdominal or pelvic lymph nodes. Reproductive: Uterus and bilateral adnexa are unremarkable. Other: Mild free fluid is noted within the pelvis related to the pancreatitis. No herniation is seen. Musculoskeletal: Degenerative changes of the lumbar spine are noted. IMPRESSION: Changes consistent with acute pancreatitis with inflammatory phlegmon extending inferiorly along the anterior aspect of the abdominal aorta and Gerota's fascia on the left. New cystic lesion in the head of the pancreas increased in size as described when compared with the prior MRI. Pseudocyst is noted along the inferior aspect of Gerota's fascia anteriorly stable in appearance from the prior CT and MRI examination. Fatty liver. Electronically Signed   By: Alcide Clever M.D.   On: 08/05/2020 16:26      Scheduled Meds: . buPROPion  150 mg Oral Daily  . buPROPion  300 mg Oral QHS  . enoxaparin (LOVENOX) injection  40 mg Subcutaneous Q24H  . fluticasone furoate-vilanterol  1 puff Inhalation Daily  . gabapentin  900 mg Oral BID  . lipase/protease/amylase  72,000 Units Oral TID WC  . melatonin  10 mg Oral QHS  . metoprolol succinate  50 mg Oral Daily   . montelukast  10 mg Oral QHS  . pantoprazole  80 mg Oral Daily  . QUEtiapine  100 mg Oral QHS  . rosuvastatin  10 mg Oral Daily  . sertraline  75 mg Oral Daily  . umeclidinium bromide  1 puff Inhalation Daily   Continuous Infusions: . lactated ringers 150 mL/hr at 08/06/20 0847     LOS: 1 day      Time spent: 20 minutes   Noralee Stain, DO [facility] 08/06/2020, 10:35 AM   Available via Epic secure chat 7am-7pm After these hours, please refer to coverage provider listed on [email or web address]

## 2020-08-07 LAB — BASIC METABOLIC PANEL
Anion gap: 7 (ref 5–15)
BUN: <5 mg/dL — ABNORMAL LOW (ref 6–20)
CO2: 27 mmol/L (ref 22–32)
Calcium: 8.7 mg/dL — ABNORMAL LOW (ref 8.9–10.3)
Chloride: 106 mmol/L (ref 98–111)
Creatinine, Ser: 0.58 mg/dL (ref 0.44–1.00)
GFR, Estimated: >60 mL/min (ref >60)
Glucose, Bld: 101 mg/dL — ABNORMAL HIGH (ref 70–99)
Potassium: 4 mmol/L (ref 3.5–5.1)
Sodium: 140 mmol/L (ref 135–145)

## 2020-08-07 LAB — MAGNESIUM: Magnesium: 1.4 mg/dL — ABNORMAL LOW (ref 1.7–2.4)

## 2020-08-07 MED ORDER — ADULT MULTIVITAMIN W/MINERALS CH
1.0000 | ORAL_TABLET | Freq: Every day | ORAL | Status: DC
  Administered 2020-08-07 – 2020-08-10 (×4): 1 via ORAL
  Filled 2020-08-07 (×4): qty 1

## 2020-08-07 MED ORDER — PROSOURCE PLUS PO LIQD
30.0000 mL | Freq: Two times a day (BID) | ORAL | Status: DC
  Administered 2020-08-07 – 2020-08-10 (×4): 30 mL via ORAL
  Filled 2020-08-07 (×4): qty 30

## 2020-08-07 MED ORDER — BOOST / RESOURCE BREEZE PO LIQD CUSTOM
1.0000 | Freq: Three times a day (TID) | ORAL | Status: DC
  Administered 2020-08-07 – 2020-08-10 (×8): 1 via ORAL

## 2020-08-07 MED ORDER — HYDROMORPHONE HCL 1 MG/ML IJ SOLN
0.5000 mg | INTRAMUSCULAR | Status: DC | PRN
  Administered 2020-08-07 (×3): 1 mg via INTRAVENOUS
  Administered 2020-08-07: 0.5 mg via INTRAVENOUS
  Administered 2020-08-08 – 2020-08-10 (×13): 1 mg via INTRAVENOUS
  Filled 2020-08-07 (×18): qty 1

## 2020-08-07 MED ORDER — MAGNESIUM SULFATE 2 GM/50ML IV SOLN
2.0000 g | Freq: Once | INTRAVENOUS | Status: AC
  Administered 2020-08-07: 2 g via INTRAVENOUS
  Filled 2020-08-07: qty 50

## 2020-08-07 NOTE — Progress Notes (Signed)
Initial Nutrition Assessment  DOCUMENTATION CODES:  Non-severe (moderate) malnutrition in context of chronic illness  INTERVENTION:  Advance diet as medically able.  Add Boost Breeze po TID, each supplement provides 250 kcal and 9 grams of protein.  Add 30 ml ProSource Plus po BID, each supplement provides 100 kcal and 15 grams of protein.   Add MVI with minerals daily.  NUTRITION DIAGNOSIS:  Moderate Malnutrition related to chronic illness as evidenced by percent weight loss,mild fat depletion,mild muscle depletion,moderate muscle depletion.  GOAL:  Patient will meet greater than or equal to 90% of their needs  MONITOR:  PO intake,Supplement acceptance,Labs,Diet advancement,Weight trends,I & O's  REASON FOR ASSESSMENT:  Malnutrition Screening Tool    ASSESSMENT:  35 yo female with a PMH of chronic EtOH induced pancreatitis since 2016 (2-3 flare ups yearly), s/p cholecystectomy in 06/2018, and considered for spinal cord stimulation as outpt (followed by [facility] and [facility]) who presents with acute on chronic pancreatitis. Per MD note, last week, pt had viral GI illness and has had increased epigastric abdominal pain that radiates to L upper quadrant with N/V. Not been able to keep down anything, including oral medications, at home. 4/19 - CT scan w/ contrast showed acute pancreatitis  Per GI note, tolerates low fat diet well.  Spoke with pt at bedside. She reports feeling sick with a stomach bug about 1.5 weeks ago. She reports that this likely flared up her pancreatitis, as she was unable to keep anything down after the virus had gone. She reports that she eats a lowfat diet at home and drinks at least one [employer] daily. She also reports taking Creon with all meals at home.   She is tolerating clears and excited for fulls for dinner again, as she is feeling much better and is hungry.  Per Epic, pt has lost 37 lbs (16%) in the last 9 months, which is  significant and severe. She reports a 100 lb weight loss in a year time frame. On exam, pt has some mild/moderate depletions.  Pt requested Boost Breeze TID as she is not a fan of the other supplements. She is also open to trying ProSource Plus for extra protein. Also recommend adding MVI with minerals daily.  Medications: Creon TID with meals, Protonix, LR @ 100 ml/hr, Dilaudid, oxycodone Labs: reviewed; CBG 96-112  NUTRITION - FOCUSED PHYSICAL EXAM: Flowsheet Row Most Recent Value  Orbital Region Mild depletion  Upper Arm Region No depletion  Thoracic and Lumbar Region Mild depletion  Buccal Region Mild depletion  Temple Region Mild depletion  Clavicle Bone Region Mild depletion  Clavicle and Acromion Bone Region No depletion  Scapular Bone Region Unable to assess  Dorsal Hand No depletion  Patellar Region Mild depletion  Anterior Thigh Region Mild depletion  Posterior Calf Region Moderate depletion  Edema (RD Assessment) None  Hair Reviewed  Eyes Reviewed  [pale conjunctiva]  Mouth Reviewed  Skin Reviewed  Nails Reviewed     Diet Order:   Diet Order            DIET SOFT Room service appropriate? Yes; Fluid consistency: Thin  Diet effective now                EDUCATION NEEDS:  Education needs have been addressed  Skin:  Skin Assessment: Reviewed RN Assessment  Last BM:  08/03/20 - Type 5, small amount  Height:  Ht Readings from Last 1 Encounters:  08/05/20 5\' 5"  (1.651 m)  Weight:  Wt Readings from Last 1 Encounters:  08/05/20 86.2 kg   Ideal Body Weight:  56.8 kg  BMI:  Body mass index is 31.62 kg/m.  Estimated Nutritional Needs:  Kcal:  2000-2200 Protein:  85-100 grams Fluid:  >2 L  Vertell Limber, RD, LDN Registered Dietitian After Hours/Weekend Pager # in Amion

## 2020-08-07 NOTE — Progress Notes (Addendum)
Progress Note   Subjective  Chief Complaint: Pancreatitis  This morning, patient tells me that she feels much better than when she came in.  When she came in she was unable to hold down anything and yesterday was able to tolerate a clear liquid diet.  She is moving to full liquids to see how she does today.  Definitely improving.   Objective   Vital signs in last 24 hours: Temp:  [97.8 F (36.6 C)-98.3 F (36.8 C)] 97.8 F (36.6 C) (04/21 0523) Pulse Rate:  [76-85] 84 (04/21 0523) Resp:  [18-20] 18 (04/21 0523) BP: (115-120)/(69-87) 115/81 (04/21 0523) SpO2:  [100 %] 100 % (04/21 0826) Last BM Date: 08/03/20 General:    white female in NAD Heart:  Regular rate and rhythm; no murmurs Lungs: Respirations even and unlabored, lungs CTA bilaterally Abdomen:  Soft, mild epigastric TTP and nondistended. Normal bowel sounds. Psych:  Cooperative. Normal mood and affect.  Intake/Output from previous day: 04/20 0701 - 04/21 0700 In: 2690 [P.O.:440; I.V.:2250] Out: -   Lab Results: Recent Labs    08/05/20 0836 08/06/20 0522  WBC 13.3* 8.1  HGB 14.3 11.2*  HCT 42.2 34.4*  PLT 340 212   BMET Recent Labs    08/05/20 0836 08/06/20 0522 08/07/20 0516  NA 141 139 140  K 3.4* 3.6 4.0  CL 102 104 106  CO2 29 28 27   GLUCOSE 136* 113* 101*  BUN 8 6 <5*  CREATININE 0.68 0.64 0.58  CALCIUM 9.1 8.5* 8.7*   LFT Recent Labs    08/05/20 0836  PROT 7.1  ALBUMIN 3.2*  AST 19  ALT 14  ALKPHOS 129*  BILITOT 1.2   Studies/Results: CT ABDOMEN PELVIS W CONTRAST  Result Date: 08/05/2020 CLINICAL DATA:  Acute abdominal pain on the left for several days EXAM: CT ABDOMEN AND PELVIS WITH CONTRAST TECHNIQUE: Multidetector CT imaging of the abdomen and pelvis was performed using the standard protocol following bolus administration of intravenous contrast. CONTRAST:  08/07/2020 OMNIPAQUE IOHEXOL 300 MG/ML  SOLN COMPARISON:  11/06/2019, MRI from 02/06/2020 FINDINGS: Lower chest: No acute  abnormality. Hepatobiliary: Fatty infiltration of the liver is noted. Gallbladder has been surgically removed. Pancreas: Pancreas is well visualized and demonstrates interval development of a large cystic lesion in the head of the pancreas which measures approximately 3.2 cm in greatest dimension. This has increased in size when compare with the prior MRI examination at which time it measured 1.7 cm in greatest dimension. The previously seen cystic lesions in the body and tail of the pancreas are not as well appreciated on this exam. Additionally some mild peripancreatic inflammatory changes and fluid are seen. This extends inferiorly along the anterior aspect of Gerota's fascia on the left. A pseudocyst is noted measuring 4 cm in dimension stable in appearance from the prior exams. Spleen: Normal in size without focal abnormality. Adrenals/Urinary Tract: Adrenal glands are within normal limits. Kidneys demonstrate a normal enhancement pattern bilaterally. No renal calculi are seen. No obstructive changes are noted. Bladder is decompressed. Stomach/Bowel: No obstructive or inflammatory changes of the colon are seen. The appendix is not well visualized. No obstructive changes of the small bowel are noted. The stomach is within normal limits. Vascular/Lymphatic: Aortic atherosclerosis. No enlarged abdominal or pelvic lymph nodes. Reproductive: Uterus and bilateral adnexa are unremarkable. Other: Mild free fluid is noted within the pelvis related to the pancreatitis. No herniation is seen. Musculoskeletal: Degenerative changes of the lumbar spine are noted. IMPRESSION: Changes consistent  with acute pancreatitis with inflammatory phlegmon extending inferiorly along the anterior aspect of the abdominal aorta and Gerota's fascia on the left. New cystic lesion in the head of the pancreas increased in size as described when compared with the prior MRI. Pseudocyst is noted along the inferior aspect of Gerota's fascia  anteriorly stable in appearance from the prior CT and MRI examination. Fatty liver. Electronically Signed   By: Alcide Clever M.D.   On: 08/05/2020 16:26    Assessment / Plan:   Assessment: 1.  Pancreatitis: Complicated by phlegmon, pseudocyst and splenic vein thrombosis, etiology presumably alcohol related in the past, now acute on chronic, typically follows with [facility] now, CT reviewed by Dr. Lavon Paganini yesterday with recommendations for repeat imaging in 3 to 4 weeks likely through GI at [facility] as this is who she is following with now  Plan: 1.  Okay with full liquid diet today and see how she does 2.  Please await any further recommendations from Dr. Chales Abrahams later today  Thank you for your kind consultation, we will continue to follow along while she is hospitalized.   LOS: 2 days   Unk Lightning  08/07/2020, 9:46 AM   Attending physician's note   I have taken an interval history, reviewed the chart and examined the patient. I agree with the Advanced Practitioner's note, impression and recommendations.   Patient with recurrent pancreatitis with pseudocyst Feels much better today Tolerating p.o. without any problems  Plan: -FU at [facility] where she is established patient.  She already has FU appt. -Ambulate -Resume Creon when able to tolerate p.o. -Stop smoking.  She is willing to give it a try. -Will sign off for now. -Anticipate discharge in a.m.  Edman Circle, MD [facility] [phone]

## 2020-08-07 NOTE — Progress Notes (Signed)
PROGRESS NOTE    Bernardette [name]  KKX:[id number] DOB: May 25, 1985 DOA: 08/05/2020 PCP: [name], MD     Brief Narrative:  Kara [name] is a 35 y.o. 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  She states that she has flareups 2-3 times yearly.  She states that last week, she suffered from a viral GI illness, since then she has had worsening epigastric abdominal pain that radiates to the left upper quadrant.  This is consistent with her previous episodes of pancreatitis, pain is sharp in nature, associated with nausea and vomiting.  She has not been able to keep down her oral medications at home.  She denies any fevers or chills, chest pain or shortness of breath.  ED Course: Patient was found to be elevated.  CT abdomen pelvis which showed acute pancreatitis with inflammatory phlegmon, new cystic lesion in the head of the pancreas, pseudocyst.  New events last 24 hours / Subjective: She was able to tolerate clear liquids yesterday.  States that she is still requiring pain medication as needed.  No further nausea or vomiting.  She states that she does not feel ready to discharge yet, has had longstanding history of hospitalization for acute on chronic pancreatitis with readmissions.  Assessment & Plan:   Principal Problem:   Acute on chronic pancreatitis (HCC) Active Problems:   Prolonged QT interval   Gastroesophageal reflux disease   Asthma, severe persistent, well-controlled   Fatty liver   Pancreatic pseudocyst   Depression   Acute on chronic pancreatitis -Followed by [facility].  Chronic pancreatitis has been ongoing since 2016, initially attributed to alcohol use, is currently on oxycodone as an outpatient, managed by [facility] and considered for spinal cord stimulator.  She is status post  cholecystectomy -CT abdomen pelvis changes consistent with acute pancreatitis with inflammatory phlegmon, new cystic lesion in head of pancreas, pseudocyst stable in appearance   -Lipase elevated 146 at time of admission -Continue Creon -GI consulted, recommended repeat imaging in 3 to 4 weeks.  No recommendations for draining pseudocyst at this time. -Advance to full liquid diet today  Hypomagnesemia -Replace, trend  QT interval prolongation -Avoid QTC prolonging agents  Asthma -Albuterol as needed, Breo, Incruse Ellipta  Depression -Continue Wellbutrin Seroquel, Zoloft  Chronic pain syndrome -Continue Neurontin, oxycodone  Hyperlipidemia -Continue Crestor    DVT prophylaxis:  enoxaparin (LOVENOX) injection 40 mg Start: 08/05/20 2200  Code Status:     Code Status Orders  (From admission, onward)         Start     Ordered   08/05/20 1809  Full code  Continuous        08/05/20 1808        Code Status History    Date Active Date Inactive Code Status Order ID Comments User Context   11/07/2019 0349 11/12/2019 1753 Full Code [id number]  Rometta Emery, MD Inpatient   10/17/2019 0608 10/31/2019 1814 Full Code [id number]  Eduard Clos, MD ED   08/26/2016 2230 09/01/2016 1646 Full Code [id number]  Eduard Clos, MD ED   07/30/2016 2241 08/02/2016 2001 Full Code [id number]  Clydie Braun, MD ED   06/12/2016 2121 06/14/2016 1928 Full Code [id number]  Therisa Doyne, MD Inpatient   12/06/2015 2354 12/07/2015 0737 Full Code [id number]  Derwood Kaplan, MD ED   09/09/2015 1654  09/11/2015 2145 Full Code [id number]  Vassie LollMadera, Carlos, MD Inpatient   02/10/2015 0102 02/21/2015 1939 Full Code [id number]  Ron ParkerJenkins, Harvette C, MD Inpatient   01/29/2015 1559 02/09/2015 1425 Full Code [id number]  Marinda ElkFeliz Ortiz, Abraham, MD ED   07/16/2014 0411 07/19/2014 1527 Full Code [id number]  Eduard ClosKakrakandy, Arshad N, MD Inpatient   07/06/2014 0339 07/06/2014 2042 Full Code [id number]  Rolly SalterPatel, Pranav  M, MD Inpatient   04/08/2014 1718 04/09/2014 1952 Full Code [id number]  Rama, Maryruth Bunhristina P, MD ED   Advance Care Planning Activity     Family Communication: None at bedside Disposition Plan:  Status is: Inpatient  Remains inpatient appropriate because:Ongoing active pain requiring inpatient pain management   Dispo: The patient is from: Home              Anticipated d/c is to: Home              Patient currently is not medically stable to d/c.  Advance diet to full liquids, wean pain medication as able to tolerate   Difficult to place patient No      Consultants:   GI  Procedures:   None  Antimicrobials:  Anti-infectives (From admission, onward)   None       Objective: Vitals:   08/06/20 2026 08/07/20 0523 08/07/20 0700 08/07/20 0826  BP: 120/87 115/81    Pulse: 78 84    Resp: 18 18    Temp: 98.1 F (36.7 C) 97.8 F (36.6 C)    TempSrc:      SpO2: 100% 100% 100% 100%  Weight:      Height:        Intake/Output Summary (Last 24 hours) at 08/07/2020 1040 Last data filed at 08/07/2020 0200 Gross per 24 hour  Intake 2250 ml  Output --  Net 2250 ml   Filed Weights   08/05/20 0752  Weight: 86.2 kg   Examination: General exam: Appears calm and comfortable  Respiratory system: Clear to auscultation. Respiratory effort normal. Cardiovascular system: S1 & S2 heard, RRR. No pedal edema. Gastrointestinal system: Abdomen is nondistended, soft, Mildly tender to palpation epigastric. Normal bowel sounds heard. Central nervous system: Alert and oriented. Non focal exam. Speech clear  Extremities: Symmetric in appearance bilaterally  Skin: No rashes, lesions or ulcers on exposed skin  Psychiatry: Judgement and insight appear stable. Mood & affect appropriate.    Data Reviewed: I have personally reviewed following labs and imaging studies  CBC: Recent Labs  Lab 08/05/20 0836 08/06/20 0522  WBC 13.3* 8.1  NEUTROABS 10.1*  --   HGB 14.3 11.2*  HCT 42.2 34.4*   MCV 89.8 94.0  PLT 340 212   Basic Metabolic Panel: Recent Labs  Lab 08/05/20 0836 08/06/20 0522 08/07/20 0516  NA 141 139 140  K 3.4* 3.6 4.0  CL 102 104 106  CO2 29 28 27   GLUCOSE 136* 113* 101*  BUN 8 6 <5*  CREATININE 0.68 0.64 0.58  CALCIUM 9.1 8.5* 8.7*  MG  --   --  1.4*   GFR: Estimated Creatinine Clearance: 106.4 mL/min (by C-G formula based on SCr of 0.58 mg/dL). Liver Function Tests: Recent Labs  Lab 08/05/20 0836  AST 19  ALT 14  ALKPHOS 129*  BILITOT 1.2  PROT 7.1  ALBUMIN 3.2*   Recent Labs  Lab 08/05/20 0836  LIPASE 146*   No results for input(s): AMMONIA in the last 168 hours. Coagulation Profile: No results for input(s): INR, PROTIME in the last 168  hours. Cardiac Enzymes: No results for input(s): CKTOTAL, CKMB, CKMBINDEX, TROPONINI in the last 168 hours. BNP (last 3 results) No results for input(s): PROBNP in the last 8760 hours. HbA1C: No results for input(s): HGBA1C in the last 72 hours. CBG: No results for input(s): GLUCAP in the last 168 hours. Lipid Profile: No results for input(s): CHOL, HDL, LDLCALC, TRIG, CHOLHDL, LDLDIRECT in the last 72 hours. Thyroid Function Tests: No results for input(s): TSH, T4TOTAL, FREET4, T3FREE, THYROIDAB in the last 72 hours. Anemia Panel: No results for input(s): VITAMINB12, FOLATE, FERRITIN, TIBC, IRON, RETICCTPCT in the last 72 hours. Sepsis Labs: No results for input(s): PROCALCITON, LATICACIDVEN in the last 168 hours.  Recent Results (from the past 240 hour(s))  SARS CORONAVIRUS 2 (TAT 6-24 HRS) Nasopharyngeal Nasopharyngeal Swab     Status: None   Collection Time: 08/05/20  5:00 PM   Specimen: Nasopharyngeal Swab  Result Value Ref Range Status   SARS Coronavirus 2 NEGATIVE NEGATIVE Final    Comment: (NOTE) SARS-CoV-2 target nucleic acids are NOT DETECTED.  The SARS-CoV-2 RNA is generally detectable in upper and lower respiratory specimens during the acute phase of infection.  Negative results do not preclude SARS-CoV-2 infection, do not rule out co-infections with other pathogens, and should not be used as the sole basis for treatment or other patient management decisions. Negative results must be combined with clinical observations, patient history, and epidemiological information. The expected result is Negative.  Fact Sheet for Patients: HairSlick.no  Fact Sheet for Healthcare Providers: quierodirigir.com  This test is not yet approved or cleared by the [place] FDA and  has been authorized for detection and/or diagnosis of SARS-CoV-2 by FDA under an Emergency Use Authorization (EUA). This EUA will remain  in effect (meaning this test can be used) for the duration of the COVID-19 declaration under Se ction 564(b)(1) of the Act, 21 U.S.C. section 360bbb-3(b)(1), unless the authorization is terminated or revoked sooner.  Performed at [facility], [address], [place], [place] [address]       Radiology Studies: CT ABDOMEN PELVIS W CONTRAST  Result Date: 08/05/2020 CLINICAL DATA:  Acute abdominal pain on the left for several days EXAM: CT ABDOMEN AND PELVIS WITH CONTRAST TECHNIQUE: Multidetector CT imaging of the abdomen and pelvis was performed using the standard protocol following bolus administration of intravenous contrast. CONTRAST:  OMNIPAQUE IOHEXOL 300 MG/ML  SOLN COMPARISON:  11/06/2019, MRI from 02/06/2020 FINDINGS: Lower chest: No acute abnormality. Hepatobiliary: Fatty infiltration of the liver is noted. Gallbladder has been surgically removed. Pancreas: Pancreas is well visualized and demonstrates interval development of a large cystic lesion in the head of the pancreas which measures approximately 3.2 cm in greatest dimension. This has increased in size when compare with the prior MRI examination at which time it measured 1.7 cm in greatest dimension. The previously seen  cystic lesions in the body and tail of the pancreas are not as well appreciated on this exam. Additionally some mild peripancreatic inflammatory changes and fluid are seen. This extends inferiorly along the anterior aspect of Gerota's fascia on the left. A pseudocyst is noted measuring 4 cm in dimension stable in appearance from the prior exams. Spleen: Normal in size without focal abnormality. Adrenals/Urinary Tract: Adrenal glands are within normal limits. Kidneys demonstrate a normal enhancement pattern bilaterally. No renal calculi are seen. No obstructive changes are noted. Bladder is decompressed. Stomach/Bowel: No obstructive or inflammatory changes of the colon are seen. The appendix is not well  visualized. No obstructive changes of the small bowel are noted. The stomach is within normal limits. Vascular/Lymphatic: Aortic atherosclerosis. No enlarged abdominal or pelvic lymph nodes. Reproductive: Uterus and bilateral adnexa are unremarkable. Other: Mild free fluid is noted within the pelvis related to the pancreatitis. No herniation is seen. Musculoskeletal: Degenerative changes of the lumbar spine are noted. IMPRESSION: Changes consistent with acute pancreatitis with inflammatory phlegmon extending inferiorly along the anterior aspect of the abdominal aorta and Gerota's fascia on the left. New cystic lesion in the head of the pancreas increased in size as described when compared with the prior MRI. Pseudocyst is noted along the inferior aspect of Gerota's fascia anteriorly stable in appearance from the prior CT and MRI examination. Fatty liver. Electronically Signed   By: Alcide Clever M.D.   On: 08/05/2020 16:26      Scheduled Meds: . buPROPion  150 mg Oral Daily  . buPROPion  300 mg Oral QHS  . enoxaparin (LOVENOX) injection  40 mg Subcutaneous Q24H  . fluticasone furoate-vilanterol  1 puff Inhalation Daily  . gabapentin  900 mg Oral BID  . lipase/protease/amylase  72,000 Units Oral TID WC  .  melatonin  10 mg Oral QHS  . metoprolol succinate  50 mg Oral Daily  . montelukast  10 mg Oral QHS  . pantoprazole  80 mg Oral Daily  . QUEtiapine  100 mg Oral QHS  . rosuvastatin  10 mg Oral Daily  . sertraline  75 mg Oral Daily  . umeclidinium bromide  1 puff Inhalation Daily   Continuous Infusions: . lactated ringers 150 mL/hr at 08/07/20 0518     LOS: 2 days      Time spent: 20 minutes   Noralee Stain, DO [facility] 08/07/2020, 10:40 AM   Available via Epic secure chat 7am-7pm After these hours, please refer to coverage provider listed on [email or web address]

## 2020-08-08 DIAGNOSIS — R9431 Abnormal electrocardiogram [ECG] [EKG]: Secondary | ICD-10-CM

## 2020-08-08 DIAGNOSIS — K219 Gastro-esophageal reflux disease without esophagitis: Secondary | ICD-10-CM

## 2020-08-08 DIAGNOSIS — K760 Fatty (change of) liver, not elsewhere classified: Secondary | ICD-10-CM

## 2020-08-08 DIAGNOSIS — K863 Pseudocyst of pancreas: Secondary | ICD-10-CM

## 2020-08-08 DIAGNOSIS — F32A Depression, unspecified: Secondary | ICD-10-CM

## 2020-08-08 DIAGNOSIS — J4550 Severe persistent asthma, uncomplicated: Secondary | ICD-10-CM

## 2020-08-08 LAB — BASIC METABOLIC PANEL
Anion gap: 12 (ref 5–15)
BUN: <5 mg/dL — ABNORMAL LOW (ref 6–20)
CO2: 26 mmol/L (ref 22–32)
Calcium: 9.2 mg/dL (ref 8.9–10.3)
Chloride: 102 mmol/L (ref 98–111)
Creatinine, Ser: 0.66 mg/dL (ref 0.44–1.00)
GFR, Estimated: >60 mL/min (ref >60)
Glucose, Bld: 95 mg/dL (ref 70–99)
Potassium: 4 mmol/L (ref 3.5–5.1)
Sodium: 140 mmol/L (ref 135–145)

## 2020-08-08 LAB — CBC
HCT: 37 % (ref 36.0–46.0)
Hemoglobin: 12 g/dL (ref 12.0–15.0)
MCH: 30.5 pg (ref 26.0–34.0)
MCHC: 32.4 g/dL (ref 30.0–36.0)
MCV: 94.1 fL (ref 80.0–100.0)
Platelets: 202 10*3/uL (ref 150–400)
RBC: 3.93 MIL/uL (ref 3.87–5.11)
RDW: 12.3 % (ref 11.5–15.5)
WBC: 7 10*3/uL (ref 4.0–10.5)
nRBC: 0 % (ref 0.0–0.2)

## 2020-08-08 LAB — MAGNESIUM: Magnesium: 1.7 mg/dL (ref 1.7–2.4)

## 2020-08-08 NOTE — Progress Notes (Signed)
PROGRESS NOTE    Kara [name]  JKK:[id number] DOB: November 15, 1985 DOA: 08/05/2020 PCP: [name], MD     Brief Narrative:  Kara [name] is a 35 y.o. female with medical history significant for chronic alcohol induced pancreatitis  considered for spinal cord stimulation as outpatient (followed by [facility] specialist as well as [facility]).    Patient does have the acute episodes 2-3 times yearly and this time started with some viral illness as per the patient.  In the ED, patient was noted to have mildly elevated lipase.  CT scan of the abdomen and pelvis showed acute pancreatitis with inflammatory phlegmon, new cystic lesion in the head of the pancreas, pseudocyst.  Patient was then considered for admission to the hospital for further evaluation and treatment.   Assessment & Plan:   Principal Problem:   Acute on chronic pancreatitis (HCC) Active Problems:   Prolonged QT interval   Gastroesophageal reflux disease   Asthma, severe persistent, well-controlled   Fatty liver   Pancreatic pseudocyst   Depression  Acute on chronic pancreatitis Follows up at [facility].  Has had history of celiac plexus block.  Currently on oxycodone but recently has had a flare.  Follows up with [facility] and is being considered for a spinal cord stimulator.  CT abdomen pelvis changes consistent with acute pancreatitis with inflammatory phlegmon, new cystic lesion in head of pancreas, pseudocyst stable in appearance.  Feels elevated.  Continue Creon.  GI has been consulted   imaging in 3 to 4 weeks.  No recommendations for draining pseudocyst at this time.  Stool soft diet but will be downgraded to full liquids again since she had more pain.  Hypomagnesemia Improved after replacement.  Magnesium of 1.7 today.  QT interval prolongation We will try to avoid QT prolonging agents  Asthma Continue albuterol as needed, Breo, Incruse Ellipta  Depression Continue Wellbutrin,  Seroquel, Zoloft  Chronic pain syndrome Continue Neurontin, oxycodone  Hyperlipidemia Continue Crestor.  DVT prophylaxis:  enoxaparin (LOVENOX) injection 40 mg Start: 08/05/20 2200   Family Communication:  None  Disposition Plan:  Status is: Inpatient  Remains inpatient appropriate because:Ongoing active pain requiring inpatient pain management   Dispo: The patient is from: Home              Anticipated d/c is to: Home              Patient currently is not medically stable to d/c.  Will downgrade diet to full liquids from soft diet.   Difficult to place patient No  Consultants:   GI  Procedures:   None  Antimicrobials:  None  Subjective Today, patient was seen and examined at bedside.  Complains of mild abdominal pain.  Denies any nausea, vomiting or fever.  Objective: Vitals:   08/07/20 1934 08/08/20 0527 08/08/20 0752 08/08/20 1318  BP: (!) 135/95 (!) 139/102  (!) 119/94  Pulse: 87 84  93  Resp: 18 18  18   Temp: 98.5 F (36.9 C) 98.2 F (36.8 C)  98.6 F (37 C)  TempSrc: Oral     SpO2: 100% 99% 100%   Weight:      Height:        Intake/Output Summary (Last 24 hours) at 08/08/2020 1421 Last data filed at 08/08/2020 1300 Gross per 24 hour  Intake 2500.89 ml  Output --  Net 2500.89 ml   Filed Weights   08/05/20 0752  Weight: 86.2 kg   Physical examination:  General: Obese built, not in obvious distress HENT:   No scleral pallor or icterus noted. Oral mucosa is moist.  Chest:  Clear breath sounds.  Diminished breath sounds bilaterally. No crackles or wheezes.  CVS: S1 &S2 heard. No murmur.  Regular rate and rhythm. Abdomen: Soft, mild nonspecific tenderness of the abdomen,nondistended.  Bowel sounds are heard.   Extremities: No cyanosis, clubbing or edema.  Peripheral pulses are palpable. Psych: Alert, awake and oriented, normal mood CNS:  No cranial nerve deficits.  Power equal in all extremities.   Skin: Warm and dry.  No rashes  noted.  Data Reviewed: I have personally reviewed following labs and imaging studies  CBC: Recent Labs  Lab 08/05/20 0836 08/06/20 0522 08/08/20 0522  WBC 13.3* 8.1 7.0  NEUTROABS 10.1*  --   --   HGB 14.3 11.2* 12.0  HCT 42.2 34.4* 37.0  MCV 89.8 94.0 94.1  PLT 340 212 202   Basic Metabolic Panel: Recent Labs  Lab 08/05/20 0836 08/06/20 0522 08/07/20 0516 08/08/20 0522  NA 141 139 140 140  K 3.4* 3.6 4.0 4.0  CL 102 104 106 102  CO2 29 28 27 26   GLUCOSE 136* 113* 101* 95  BUN 8 6 <5* <5*  CREATININE 0.68 0.64 0.58 0.66  CALCIUM 9.1 8.5* 8.7* 9.2  MG  --   --  1.4* 1.7   GFR: Estimated Creatinine Clearance: 106.4 mL/min (by C-G formula based on SCr of 0.66 mg/dL). Liver Function Tests: Recent Labs  Lab 08/05/20 0836  AST 19  ALT 14  ALKPHOS 129*  BILITOT 1.2  PROT 7.1  ALBUMIN 3.2*   Recent Labs  Lab 08/05/20 0836  LIPASE 146*   No results for input(s): AMMONIA in the last 168 hours. Coagulation Profile: No results for input(s): INR, PROTIME in the last 168 hours. Cardiac Enzymes: No results for input(s): CKTOTAL, CKMB, CKMBINDEX, TROPONINI in the last 168 hours. BNP (last 3 results) No results for input(s): PROBNP in the last 8760 hours. HbA1C: No results for input(s): HGBA1C in the last 72 hours. CBG: No results for input(s): GLUCAP in the last 168 hours. Lipid Profile: No results for input(s): CHOL, HDL, LDLCALC, TRIG, CHOLHDL, LDLDIRECT in the last 72 hours. Thyroid Function Tests: No results for input(s): TSH, T4TOTAL, FREET4, T3FREE, THYROIDAB in the last 72 hours. Anemia Panel: No results for input(s): VITAMINB12, FOLATE, FERRITIN, TIBC, IRON, RETICCTPCT in the last 72 hours. Sepsis Labs: No results for input(s): PROCALCITON, LATICACIDVEN in the last 168 hours.  Recent Results (from the past 240 hour(s))  SARS CORONAVIRUS 2 (TAT 6-24 HRS) Nasopharyngeal Nasopharyngeal Swab     Status: None   Collection Time: 08/05/20  5:00 PM    Specimen: Nasopharyngeal Swab  Result Value Ref Range Status   SARS Coronavirus 2 NEGATIVE NEGATIVE Final    Comment: (NOTE) SARS-CoV-2 target nucleic acids are NOT DETECTED.  The SARS-CoV-2 RNA is generally detectable in upper and lower respiratory specimens during the acute phase of infection. Negative results do not preclude SARS-CoV-2 infection, do not rule out co-infections with other pathogens, and should not be used as the sole basis for treatment or other patient management decisions. Negative results must be combined with clinical observations, patient history, and epidemiological information. The expected result is Negative.  Fact Sheet for Patients: 08/07/20  Fact Sheet for Healthcare Providers: HairSlick.no  This test is not yet approved or cleared by the [place] FDA and  has been authorized for detection and/or diagnosis of  SARS-CoV-2 by FDA under an Emergency Use Authorization (EUA). This EUA will remain  in effect (meaning this test can be used) for the duration of the COVID-19 declaration under Se ction 564(b)(1) of the Act, 21 U.S.C. section 360bbb-3(b)(1), unless the authorization is terminated or revoked sooner.  Performed at [facility], [address], [place], [place] [address]       Radiology Studies: No results found.    Scheduled Meds: . (feeding supplement) PROSource Plus  30 mL Oral BID BM  . buPROPion  150 mg Oral Daily  . buPROPion  300 mg Oral QHS  . enoxaparin (LOVENOX) injection  40 mg Subcutaneous Q24H  . feeding supplement  1 Container Oral TID BM  . fluticasone furoate-vilanterol  1 puff Inhalation Daily  . gabapentin  900 mg Oral BID  . lipase/protease/amylase  72,000 Units Oral TID WC  . melatonin  10 mg Oral QHS  . metoprolol succinate  50 mg Oral Daily  . montelukast  10 mg Oral QHS  . multivitamin with minerals  1 tablet Oral Daily  . pantoprazole  80  mg Oral Daily  . QUEtiapine  100 mg Oral QHS  . rosuvastatin  10 mg Oral Daily  . sertraline  75 mg Oral Daily  . umeclidinium bromide  1 puff Inhalation Daily   Continuous Infusions: . lactated ringers 100 mL/hr at 08/07/20 2125     LOS: 3 days    Merit Gadsby, DO [facility] 08/08/2020, 2:21 PM

## 2020-08-09 LAB — CBC
HCT: 33.9 % — ABNORMAL LOW (ref 36.0–46.0)
Hemoglobin: 11.1 g/dL — ABNORMAL LOW (ref 12.0–15.0)
MCH: 30.7 pg (ref 26.0–34.0)
MCHC: 32.7 g/dL (ref 30.0–36.0)
MCV: 93.6 fL (ref 80.0–100.0)
Platelets: 217 10*3/uL (ref 150–400)
RBC: 3.62 MIL/uL — ABNORMAL LOW (ref 3.87–5.11)
RDW: 12.4 % (ref 11.5–15.5)
WBC: 6.5 10*3/uL (ref 4.0–10.5)
nRBC: 0 % (ref 0.0–0.2)

## 2020-08-09 LAB — PHOSPHORUS: Phosphorus: 3.9 mg/dL (ref 2.5–4.6)

## 2020-08-09 LAB — BASIC METABOLIC PANEL
Anion gap: 9 (ref 5–15)
BUN: <5 mg/dL — ABNORMAL LOW (ref 6–20)
CO2: 28 mmol/L (ref 22–32)
Calcium: 8.7 mg/dL — ABNORMAL LOW (ref 8.9–10.3)
Chloride: 99 mmol/L (ref 98–111)
Creatinine, Ser: 0.5 mg/dL (ref 0.44–1.00)
GFR, Estimated: >60 mL/min (ref >60)
Glucose, Bld: 103 mg/dL — ABNORMAL HIGH (ref 70–99)
Potassium: 3.5 mmol/L (ref 3.5–5.1)
Sodium: 136 mmol/L (ref 135–145)

## 2020-08-09 LAB — MAGNESIUM: Magnesium: 1.5 mg/dL — ABNORMAL LOW (ref 1.7–2.4)

## 2020-08-09 MED ORDER — MAGNESIUM SULFATE 2 GM/50ML IV SOLN
2.0000 g | Freq: Once | INTRAVENOUS | Status: AC
  Administered 2020-08-09: 2 g via INTRAVENOUS
  Filled 2020-08-09: qty 50

## 2020-08-09 NOTE — Progress Notes (Signed)
PROGRESS NOTE    Kara [name]  QAS:[id number] DOB: 05-04-1985 DOA: 08/05/2020 PCP: [name], MD     Brief Narrative:   Kara [name] is a 34 y.o. female with medical history significant for chronic alcohol induced pancreatitis  considered for spinal cord stimulation as outpatient (followed by [facility] specialist as well as [facility]).    Patient does have the acute episodes 2-3 times yearly and this time started with some viral illness as per the patient.  In the ED, patient was noted to have mildly elevated lipase.  CT scan of the abdomen and pelvis showed acute pancreatitis with inflammatory phlegmon, new cystic lesion in the head of the pancreas, pseudocyst.  Patient was then considered for admission to the hospital for further evaluation and treatment.   Assessment & Plan:   Principal Problem:   Acute on chronic pancreatitis (HCC) Active Problems:   Prolonged QT interval   Gastroesophageal reflux disease   Asthma, severe persistent, well-controlled   Fatty liver   Pancreatic pseudocyst   Depression  Acute on chronic pancreatitis Follows up at [facility].  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  Currently on oxycodone but recently has had a flare.  Follows up with [facility] and is being considered for a spinal cord stimulator.  CT abdomen/ pelvis changes consistent with acute pancreatitis with inflammatory phlegmon, new cystic lesion in head of pancreas, pseudocyst stable in appearance.   Continue Creon.  GI has been consulted and recommends   imaging in 3 to 4 weeks.  No recommendations for draining pseudocyst at this time.  We will try to advance to soft diet again and see how she does today.  Patient has mild abdominal pain but denies any nausea or vomiting.  Hypomagnesemia Continue to replenish magnesium sulfate.  Magnesium 1.5 today.  QT interval prolongation On presentation.  We will try to avoid QT prolonging  agents  Asthma Continue albuterol as needed, Breo, Incruse Ellipta.  Currently compensated  Depression Continue Wellbutrin, Seroquel, Zoloft  Chronic pain syndrome Continue Neurontin, oxycodone  Hyperlipidemia Continue Crestor.  DVT prophylaxis:  enoxaparin (LOVENOX) injection 40 mg Start: 08/05/20 2200   Family Communication:  None  Disposition Plan:  Status is: Inpatient  Remains inpatient appropriate because:Ongoing active pain requiring inpatient pain management, acute on chronic pancreatitis  Dispo: The patient is from: Home              Anticipated d/c is to: Home              Patient currently is not medically stable to d/c.  If tolerates diet likely disposition home by tomorrow   difficult to place patient No  Consultants:   GI  Procedures:   None  Antimicrobials:  None  Subjective Today, patient was seen and examined at bedside.  Complains of mild abdominal pain but denies any nausea vomiting or fever.  Had to be downgraded to full liquids yesterday but wants to consider back to soft diet today.  Objective: Vitals:   08/09/20 0453 08/09/20 0816 08/09/20 0818 08/09/20 1326  BP: 125/83   (!) 136/91  Pulse: 83   82  Resp: 15   15  Temp: 98.2 F (36.8 C)   97.9 F (36.6 C)  TempSrc: Oral   Oral  SpO2: 96% 96% 96% 98%  Weight:      Height:        Intake/Output Summary (Last 24 hours) at 08/09/2020 1601  Last data filed at 08/09/2020 1500 Gross per 24 hour  Intake 5486.9 ml  Output --  Net 5486.9 ml   Filed Weights   08/05/20 0752  Weight: 86.2 kg   Physical examination: General: Obese built, not in obvious distress HENT:   No scleral pallor or icterus noted. Oral mucosa is moist.  Chest:  Clear breath sounds.  Diminished breath sounds bilaterally. No crackles or wheezes.  CVS: S1 &S2 heard. No murmur.  Regular rate and rhythm. Abdomen: Soft, mild nonspecific tenderness over the epigastric region,, nondistended.  Bowel sounds are heard.    Extremities: No cyanosis, clubbing or edema.  Peripheral pulses are palpable. Psych: Alert, awake and oriented, normal mood CNS:  No cranial nerve deficits.  Power equal in all extremities.   Skin: Warm and dry.  No rashes noted.   Data Reviewed: I have personally reviewed following labs and imaging studies  CBC: Recent Labs  Lab 08/05/20 0836 08/06/20 0522 08/08/20 0522 08/09/20 0523  WBC 13.3* 8.1 7.0 6.5  NEUTROABS 10.1*  --   --   --   HGB 14.3 11.2* 12.0 11.1*  HCT 42.2 34.4* 37.0 33.9*  MCV 89.8 94.0 94.1 93.6  PLT 340 212 202 217   Basic Metabolic Panel: Recent Labs  Lab 08/05/20 0836 08/06/20 0522 08/07/20 0516 08/08/20 0522 08/09/20 0523  NA 141 139 140 140 136  K 3.4* 3.6 4.0 4.0 3.5  CL 102 104 106 102 99  CO2 29 28 27 26 28   GLUCOSE 136* 113* 101* 95 103*  BUN 8 6 <5* <5* <5*  CREATININE 0.68 0.64 0.58 0.66 0.50  CALCIUM 9.1 8.5* 8.7* 9.2 8.7*  MG  --   --  1.4* 1.7 1.5*  PHOS  --   --   --   --  3.9   GFR: Estimated Creatinine Clearance: 106.4 mL/min (by C-G formula based on SCr of 0.5 mg/dL). Liver Function Tests: Recent Labs  Lab 08/05/20 0836  AST 19  ALT 14  ALKPHOS 129*  BILITOT 1.2  PROT 7.1  ALBUMIN 3.2*   Recent Labs  Lab 08/05/20 0836  LIPASE 146*   No results for input(s): AMMONIA in the last 168 hours. Coagulation Profile: No results for input(s): INR, PROTIME in the last 168 hours. Cardiac Enzymes: No results for input(s): CKTOTAL, CKMB, CKMBINDEX, TROPONINI in the last 168 hours. BNP (last 3 results) No results for input(s): PROBNP in the last 8760 hours. HbA1C: No results for input(s): HGBA1C in the last 72 hours. CBG: No results for input(s): GLUCAP in the last 168 hours. Lipid Profile: No results for input(s): CHOL, HDL, LDLCALC, TRIG, CHOLHDL, LDLDIRECT in the last 72 hours. Thyroid Function Tests: No results for input(s): TSH, T4TOTAL, FREET4, T3FREE, THYROIDAB in the last 72 hours. Anemia Panel: No results  for input(s): VITAMINB12, FOLATE, FERRITIN, TIBC, IRON, RETICCTPCT in the last 72 hours. Sepsis Labs: No results for input(s): PROCALCITON, LATICACIDVEN in the last 168 hours.  Recent Results (from the past 240 hour(s))  SARS CORONAVIRUS 2 (TAT 6-24 HRS) Nasopharyngeal Nasopharyngeal Swab     Status: None   Collection Time: 08/05/20  5:00 PM   Specimen: Nasopharyngeal Swab  Result Value Ref Range Status   SARS Coronavirus 2 NEGATIVE NEGATIVE Final    Comment: (NOTE) SARS-CoV-2 target nucleic acids are NOT DETECTED.  The SARS-CoV-2 RNA is generally detectable in upper and lower respiratory specimens during the acute phase of infection. Negative results do not preclude SARS-CoV-2 infection, do not rule  out co-infections with other pathogens, and should not be used as the sole basis for treatment or other patient management decisions. Negative results must be combined with clinical observations, patient history, and epidemiological information. The expected result is Negative.  Fact Sheet for Patients: HairSlick.no  Fact Sheet for Healthcare Providers: quierodirigir.com  This test is not yet approved or cleared by the [place] FDA and  has been authorized for detection and/or diagnosis of SARS-CoV-2 by FDA under an Emergency Use Authorization (EUA). This EUA will remain  in effect (meaning this test can be used) for the duration of the COVID-19 declaration under Se ction 564(b)(1) of the Act, 21 U.S.C. section 360bbb-3(b)(1), unless the authorization is terminated or revoked sooner.  Performed at [facility], [address] [place], [place] [address]       Radiology Studies: No results found.    Scheduled Meds: . (feeding supplement) PROSource Plus  30 mL Oral BID BM  . buPROPion  150 mg Oral Daily  . buPROPion  300 mg Oral QHS  . enoxaparin (LOVENOX) injection  40 mg Subcutaneous Q24H  . feeding  supplement  1 Container Oral TID BM  . fluticasone furoate-vilanterol  1 puff Inhalation Daily  . gabapentin  900 mg Oral BID  . lipase/protease/amylase  72,000 Units Oral TID WC  . melatonin  10 mg Oral QHS  . metoprolol succinate  50 mg Oral Daily  . montelukast  10 mg Oral QHS  . multivitamin with minerals  1 tablet Oral Daily  . pantoprazole  80 mg Oral Daily  . QUEtiapine  100 mg Oral QHS  . rosuvastatin  10 mg Oral Daily  . sertraline  75 mg Oral Daily  . umeclidinium bromide  1 puff Inhalation Daily   Continuous Infusions: . lactated ringers 100 mL/hr at 08/09/20 0721     LOS: 4 days    Deleah Tison, DO [facility] 08/09/2020, 4:01 PM

## 2020-08-10 LAB — CBC
HCT: 33.1 % — ABNORMAL LOW (ref 36.0–46.0)
Hemoglobin: 10.7 g/dL — ABNORMAL LOW (ref 12.0–15.0)
MCH: 30.7 pg (ref 26.0–34.0)
MCHC: 32.3 g/dL (ref 30.0–36.0)
MCV: 94.8 fL (ref 80.0–100.0)
Platelets: 220 10*3/uL (ref 150–400)
RBC: 3.49 MIL/uL — ABNORMAL LOW (ref 3.87–5.11)
RDW: 12.4 % (ref 11.5–15.5)
WBC: 6.6 10*3/uL (ref 4.0–10.5)
nRBC: 0 % (ref 0.0–0.2)

## 2020-08-10 LAB — COMPREHENSIVE METABOLIC PANEL
ALT: 17 U/L (ref 0–44)
AST: 22 U/L (ref 15–41)
Albumin: 2.6 g/dL — ABNORMAL LOW (ref 3.5–5.0)
Alkaline Phosphatase: 205 U/L — ABNORMAL HIGH (ref 38–126)
Anion gap: 9 (ref 5–15)
BUN: <5 mg/dL — ABNORMAL LOW (ref 6–20)
CO2: 32 mmol/L (ref 22–32)
Calcium: 8.9 mg/dL (ref 8.9–10.3)
Chloride: 102 mmol/L (ref 98–111)
Creatinine, Ser: 0.64 mg/dL (ref 0.44–1.00)
GFR, Estimated: >60 mL/min (ref >60)
Glucose, Bld: 111 mg/dL — ABNORMAL HIGH (ref 70–99)
Potassium: 4 mmol/L (ref 3.5–5.1)
Sodium: 143 mmol/L (ref 135–145)
Total Bilirubin: 0.4 mg/dL (ref 0.3–1.2)
Total Protein: 6.1 g/dL — ABNORMAL LOW (ref 6.5–8.1)

## 2020-08-10 LAB — MAGNESIUM: Magnesium: 2.1 mg/dL (ref 1.7–2.4)

## 2020-08-10 LAB — PHOSPHORUS: Phosphorus: 4 mg/dL (ref 2.5–4.6)

## 2020-08-10 LAB — LIPASE, BLOOD: Lipase: 73 U/L — ABNORMAL HIGH (ref 11–51)

## 2020-08-10 MED ORDER — POLYETHYLENE GLYCOL 3350 17 G PO PACK: 17.0000 g | PACK | Freq: Every day | ORAL | 0 refills | Status: DC | PRN

## 2020-08-10 MED ORDER — OXYCODONE HCL 15 MG PO TABS: 15.0000 mg | ORAL_TABLET | ORAL | 0 refills | Status: AC | PRN

## 2020-08-10 NOTE — Discharge Summary (Signed)
Physician Discharge Summary  Kara [name] BLT:[id number] DOB: [birth year] DOA: 08/05/2020  PCP: [name], MD  Admit date: 08/05/2020 Discharge date: 08/10/2020  Admitted From: Home  Discharge disposition: Home  Recommendations for Outpatient Follow-Up:   . Follow up with your primary care provider in one week.  . Check CBC, BMP, magnesium in the next visit . Follow-up with the your GI physician at [facility] as scheduled by you.  Will benefit from CT scan of the abdomen in 3 to 4 weeks.  Discharge Diagnosis:   Principal Problem:   Acute on chronic pancreatitis (HCC) Active Problems:   Prolonged QT interval   Gastroesophageal reflux disease   Asthma, severe persistent, well-controlled   Fatty liver   Pancreatic pseudocyst   Depression   Discharge Condition: Improved.  Diet recommendation: Low-fat diet   wound care: None.  Code status: Full.   History of Present Illness:   Kara [name] a 35 y.o.femalewith medical history significant forchronic alcohol induced pancreatitis  considered for spinal cord stimulation as outpatient(followed by [facility] specialist as well as [facility]).  Patient does have the acute episodes 2-3 times yearly and this time started with some viral illness as per the patient.  In the ED, patient was noted to have mildly elevated lipase.  CT scan of the abdomen and pelvis showed acute pancreatitis with inflammatory phlegmon, new cystic lesion in the head of the pancreas, pseudocyst.  Patient was then considered for admission to the hospital for further evaluation and treatment.  Hospital Course:   Following conditions were addressed during hospitalization as listed below,  Acute on chronic pancreatitis Follows up at [facility].  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  Currently on oxycodone but recently has had a flare.  Follows up with [facility] and is being considered for a spinal cord  stimulator.  CT abdomen/ pelvis changes consistent with acute pancreatitis with inflammatory phlegmon,new cystic lesion in head of pancreas, pseudocyst stable in appearance.   Continue Creon.  GI has been consulted and recommends imaging in 3 to 4 weeks.  No recommendations for draining pseudocyst at this time.    Patient has tolerated oral diet.   Hypomagnesemia Replenished and improved.  Magnesium prior to discharge at 2.1.   Asthma Continue albuterol as needed, Breo, Incruse Ellipta.  Currently compensated  Depression Continue Wellbutrin, Seroquel, Zoloft  Chronic pain syndrome Continue Neurontin, oxycodone  Hyperlipidemia Continue Crestor.  Disposition.  At this time, patient is stable for disposition home with outpatient PCP and GI follow-up.  Medical Consultants:    GI  Procedures:    None Subjective:   Today, patient was seen and examined at bedside.  Complains of mild abdominal pain but feels better overall.  No nausea, vomiting or diarrhea.  Was able to tolerate p.o.  Discharge Exam:   Vitals:   08/10/20 0757 08/10/20 0758  BP:    Pulse:    Resp:    Temp:    SpO2: 100% 100%   Vitals:   08/09/20 2108 08/10/20 0539 08/10/20 0757 08/10/20 0758  BP: (!) 119/94 115/79    Pulse: 79 80    Resp: 20 20    Temp: 98 F (36.7 C) 97.7 F (36.5 C)    TempSrc: Oral Oral    SpO2: 99% 100% 100% 100%  Weight:      Height:       General: Alert awake, not in obvious distress, obese HENT: pupils equally  reacting to light,  No scleral pallor or icterus noted. Oral mucosa is moist.  Chest:  Clear breath sounds.  Diminished breath sounds bilaterally. No crackles or wheezes.  CVS: S1 &S2 heard. No murmur.  Regular rate and rhythm. Abdomen: Soft, mild nonspecific tenderness over the epigastric region, nondistended.  Bowel sounds are heard.   Extremities: No cyanosis, clubbing or edema.  Peripheral pulses are palpable. Psych: Alert, awake and oriented, normal  mood CNS:  No cranial nerve deficits.  Power equal in all extremities.   Skin: Warm and dry.  No rashes noted.  The results of significant diagnostics from this hospitalization (including imaging, microbiology, ancillary and laboratory) are listed below for reference.     Diagnostic Studies:   CT ABDOMEN PELVIS W CONTRAST  Result Date: 08/05/2020 CLINICAL DATA:  Acute abdominal pain on the left for several days EXAM: CT ABDOMEN AND PELVIS WITH CONTRAST TECHNIQUE: Multidetector CT imaging of the abdomen and pelvis was performed using the standard protocol following bolus administration of intravenous contrast. CONTRAST:  OMNIPAQUE IOHEXOL 300 MG/ML  SOLN COMPARISON:  11/06/2019, MRI from 02/06/2020 FINDINGS: Lower chest: No acute abnormality. Hepatobiliary: Fatty infiltration of the liver is noted. Gallbladder has been surgically removed. Pancreas: Pancreas is well visualized and demonstrates interval development of a large cystic lesion in the head of the pancreas which measures approximately 3.2 cm in greatest dimension. This has increased in size when compare with the prior MRI examination at which time it measured 1.7 cm in greatest dimension. The previously seen cystic lesions in the body and tail of the pancreas are not as well appreciated on this exam. Additionally some mild peripancreatic inflammatory changes and fluid are seen. This extends inferiorly along the anterior aspect of Gerota's fascia on the left. A pseudocyst is noted measuring 4 cm in dimension stable in appearance from the prior exams. Spleen: Normal in size without focal abnormality. Adrenals/Urinary Tract: Adrenal glands are within normal limits. Kidneys demonstrate a normal enhancement pattern bilaterally. No renal calculi are seen. No obstructive changes are noted. Bladder is decompressed. Stomach/Bowel: No obstructive or inflammatory changes of the colon are seen. The appendix is not well visualized. No obstructive changes  of the small bowel are noted. The stomach is within normal limits. Vascular/Lymphatic: Aortic atherosclerosis. No enlarged abdominal or pelvic lymph nodes. Reproductive: Uterus and bilateral adnexa are unremarkable. Other: Mild free fluid is noted within the pelvis related to the pancreatitis. No herniation is seen. Musculoskeletal: Degenerative changes of the lumbar spine are noted. IMPRESSION: Changes consistent with acute pancreatitis with inflammatory phlegmon extending inferiorly along the anterior aspect of the abdominal aorta and Gerota's fascia on the left. New cystic lesion in the head of the pancreas increased in size as described when compared with the prior MRI. Pseudocyst is noted along the inferior aspect of Gerota's fascia anteriorly stable in appearance from the prior CT and MRI examination. Fatty liver. Electronically Signed   By: Alcide Clever M.D.   On: 08/05/2020 16:26     Labs:   Basic Metabolic Panel: Recent Labs  Lab 08/06/20 0522 08/07/20 0516 08/08/20 0522 08/09/20 0523 08/10/20 0526  NA 139 140 140 136 143  K 3.6 4.0 4.0 3.5 4.0  CL 104 106 102 99 102  CO2 28 27 26 28  32  GLUCOSE 113* 101* 95 103* 111*  BUN 6 <5* <5* <5* <5*  CREATININE 0.64 0.58 0.66 0.50 0.64  CALCIUM 8.5* 8.7* 9.2 8.7* 8.9  MG  --  1.4* 1.7  1.5* 2.1  PHOS  --   --   --  3.9 4.0   GFR Estimated Creatinine Clearance: 106.4 mL/min (by C-G formula based on SCr of 0.64 mg/dL). Liver Function Tests: Recent Labs  Lab 08/05/20 0836 08/10/20 0526  AST 19 22  ALT 14 17  ALKPHOS 129* 205*  BILITOT 1.2 0.4  PROT 7.1 6.1*  ALBUMIN 3.2* 2.6*   Recent Labs  Lab 08/05/20 0836 08/10/20 0526  LIPASE 146* 73*   No results for input(s): AMMONIA in the last 168 hours. Coagulation profile No results for input(s): INR, PROTIME in the last 168 hours.  CBC: Recent Labs  Lab 08/05/20 0836 08/06/20 0522 08/08/20 0522 08/09/20 0523 08/10/20 0526  WBC 13.3* 8.1 7.0 6.5 6.6  NEUTROABS 10.1*  --    --   --   --   HGB 14.3 11.2* 12.0 11.1* 10.7*  HCT 42.2 34.4* 37.0 33.9* 33.1*  MCV 89.8 94.0 94.1 93.6 94.8  PLT 340 212 202 217 220   Cardiac Enzymes: No results for input(s): CKTOTAL, CKMB, CKMBINDEX, TROPONINI in the last 168 hours. BNP: Invalid input(s): POCBNP CBG: No results for input(s): GLUCAP in the last 168 hours. D-Dimer No results for input(s): DDIMER in the last 72 hours. Hgb A1c No results for input(s): HGBA1C in the last 72 hours. Lipid Profile No results for input(s): CHOL, HDL, LDLCALC, TRIG, CHOLHDL, LDLDIRECT in the last 72 hours. Thyroid function studies No results for input(s): TSH, T4TOTAL, T3FREE, THYROIDAB in the last 72 hours.  Invalid input(s): FREET3 Anemia work up No results for input(s): VITAMINB12, FOLATE, FERRITIN, TIBC, IRON, RETICCTPCT in the last 72 hours. Microbiology Recent Results (from the past 240 hour(s))  SARS CORONAVIRUS 2 (TAT 6-24 HRS) Nasopharyngeal Nasopharyngeal Swab     Status: None   Collection Time: 08/05/20  5:00 PM   Specimen: Nasopharyngeal Swab  Result Value Ref Range Status   SARS Coronavirus 2 NEGATIVE NEGATIVE Final    Comment: (NOTE) SARS-CoV-2 target nucleic acids are NOT DETECTED.  The SARS-CoV-2 RNA is generally detectable in upper and lower respiratory specimens during the acute phase of infection. Negative results do not preclude SARS-CoV-2 infection, do not rule out co-infections with other pathogens, and should not be used as the sole basis for treatment or other patient management decisions. Negative results must be combined with clinical observations, patient history, and epidemiological information. The expected result is Negative.  Fact Sheet for Patients: HairSlick.no  Fact Sheet for Healthcare Providers: quierodirigir.com  This test is not yet approved or cleared by the [place] FDA and  has been authorized for detection and/or  diagnosis of SARS-CoV-2 by FDA under an Emergency Use Authorization (EUA). This EUA will remain  in effect (meaning this test can be used) for the duration of the COVID-19 declaration under Se ction 564(b)(1) of the Act, 21 U.S.C. section 360bbb-3(b)(1), unless the authorization is terminated or revoked sooner.  Performed at [facility], [address], [place], [place] [address]      Discharge Instructions:   Discharge Instructions     Call MD for:  persistant nausea and vomiting   Complete by: As directed    Call MD for:  severe uncontrolled pain   Complete by: As directed    Diet - low sodium heart healthy   Complete by: As directed    Discharge instructions   Complete by: As directed    Follow-up with your primary care physician in 1 week.  Follow-up with  GI as scheduled by you in May.   Increase activity slowly   Complete by: As directed       Allergies as of 08/10/2020       Reactions   Acetaminophen Other (See Comments)   Gi Upset   Nsaids Other (See Comments)   Upset stomach    Peanuts [peanut Oil] Hives, Itching   "ONLY TREE NUTS"   Triamterene-hctz Nausea Only   dizzy        Medication List     TAKE these medications    albuterol (2.5 MG/3ML) 0.083% nebulizer solution Commonly known as: PROVENTIL Take 3 mLs (2.5 mg total) by nebulization every 4 (four) hours as needed for wheezing or shortness of breath. What changed: Another medication with the same name was changed. Make sure you understand how and when to take each.   albuterol 108 (90 Base) MCG/ACT inhaler Commonly known as: VENTOLIN HFA 2 puffs every 4 hours as needed for coughing or wheezing. What changed:   how much to take  how to take this  when to take this  reasons to take this   Breo Ellipta 200-25 MCG/INH Aepb Generic drug: fluticasone furoate-vilanterol INHALE 1 PUFF INTO THE LUNGS DAILY   buPROPion 150 MG 12 hr tablet Commonly known as: WELLBUTRIN SR Take  150-300 mg by mouth See admin instructions. Takes 1 tablet in the morning and 2 tablets at bedtime   chlorhexidine 0.12 % solution Commonly known as: PERIDEX Use as directed 15 mLs in the mouth or throat 2 (two) times daily. After meals What changed:   when to take this  reasons to take this   Dupixent 300 MG/2ML prefilled syringe Generic drug: dupilumab INJECT 300 MG SUBCUTANEOUSLY EVERY OTHER WEEK What changed: See the new instructions.   EPINEPHrine 0.3 mg/0.3 mL Soaj injection Commonly known as: EpiPen 2-Pak Inject 0.3 mLs (0.3 mg total) into the muscle once as needed (for severe allergic reaction).   gabapentin 300 MG capsule Commonly known as: NEURONTIN TAKE 3 CAPSULES BY MOUTH TWICE DAILY   lipase/protease/amylase 67591 UNITS Cpep capsule Commonly known as: CREON Take 72,000 Units by mouth 3 (three) times daily with meals.   Melatonin 10 MG Tabs Take 10 mg by mouth at bedtime.   metoprolol succinate 50 MG 24 hr tablet Commonly known as: TOPROL-XL Take 1 tablet (50 mg total) by mouth daily.   montelukast 10 MG tablet Commonly known as: SINGULAIR TAKE ONE TABLET BY MOUTH AT BEDTIME What changed:   how much to take  how to take this  when to take this  additional instructions   multivitamin with minerals tablet Take 1 tablet by mouth daily.   omeprazole 40 MG capsule Commonly known as: PRILOSEC TAKE 1 CAPSULE(40 MG) BY MOUTH DAILY What changed: See the new instructions.   oxyCODONE 15 MG immediate release tablet Commonly known as: ROXICODONE Take 1 tablet (15 mg total) by mouth every 4 (four) hours as needed for severe pain or breakthrough pain.   polyethylene glycol 17 g packet Commonly known as: MIRALAX / GLYCOLAX Take 17 g by mouth daily as needed for mild constipation or moderate constipation.   QUEtiapine 100 MG tablet Commonly known as: SEROQUEL Take 100 mg by mouth at bedtime.   rosuvastatin 10 MG tablet Commonly known as: CRESTOR Take  10 mg by mouth daily.   sertraline 50 MG tablet Commonly known as: ZOLOFT Take 75 mg by mouth daily.   spironolactone 25 MG tablet Commonly known as: ALDACTONE  Take 25 mg by mouth daily as needed (leg swelling).   thiamine 100 MG tablet Take 100 mg by mouth daily.   VITAMIN D PO Take 1,000 Units by mouth daily.        Follow-up Information     Daisy Florooss, Charles Alan, MD. Schedule an appointment as soon as possible for a visit in 1 week(s).   Specialty: Family Medicine Contact information: [address] [place] [address] [phone]         Wendall StadeNishan, Peter C, MD .   Specialty: Cardiology Contact information: [id number] [place] [address] [phone]                 Time coordinating discharge: 39 minutes  Signed:  Cai Flott  [facility] 08/10/2020, 8:43 AM

## 2020-08-10 NOTE — Progress Notes (Signed)
Reviewed written d/c instructions w pt and all questions answered. Pt verbalized understanding. Pt very angry/upset that she does not have an Rx for her Oxycodone pills from MD for post-d/c. Did secure chat and several phone calls to MD to clarify. Pt is apparently under a pain med "contract" whereby she cannot pick up her new Rx supply  for her Oxy until tomorrow at 2 pm. Pt is unable to say who writes her Rx for her pain pills, denies that she has a contract, demanding that she get an RX for pain pills before she leaves. CN notified, MD apparently sent in a script for #5 pain pills, to "hold her" until she can pick up her script tomorrow. D/c via w/c w all belongings in stable condition.

## 2020-12-07 ENCOUNTER — Emergency Department (HOSPITAL_COMMUNITY): Payer: BC Managed Care – PPO

## 2020-12-07 ENCOUNTER — Encounter (HOSPITAL_COMMUNITY): Payer: Self-pay

## 2020-12-07 ENCOUNTER — Emergency Department (HOSPITAL_COMMUNITY)
Admission: EM | Admit: 2020-12-07 | Discharge: 2020-12-07 | Disposition: A | Discharge status: Home / Self Care | Payer: BC Managed Care – PPO | Attending: Emergency Medicine | Admitting: Emergency Medicine

## 2020-12-07 ENCOUNTER — Other Ambulatory Visit: Payer: Self-pay

## 2020-12-07 DIAGNOSIS — I4581 Long QT syndrome: Secondary | ICD-10-CM | POA: Diagnosis not present

## 2020-12-07 DIAGNOSIS — R17 Unspecified jaundice: Secondary | ICD-10-CM

## 2020-12-07 DIAGNOSIS — Z91010 Allergy to peanuts: Secondary | ICD-10-CM | POA: Diagnosis not present

## 2020-12-07 DIAGNOSIS — R569 Unspecified convulsions: Secondary | ICD-10-CM | POA: Diagnosis present

## 2020-12-07 DIAGNOSIS — F17210 Nicotine dependence, cigarettes, uncomplicated: Secondary | ICD-10-CM | POA: Diagnosis not present

## 2020-12-07 DIAGNOSIS — I110 Hypertensive heart disease with heart failure: Secondary | ICD-10-CM | POA: Diagnosis not present

## 2020-12-07 DIAGNOSIS — E876 Hypokalemia: Secondary | ICD-10-CM

## 2020-12-07 DIAGNOSIS — I5032 Chronic diastolic (congestive) heart failure: Secondary | ICD-10-CM | POA: Diagnosis not present

## 2020-12-07 DIAGNOSIS — R9431 Abnormal electrocardiogram [ECG] [EKG]: Secondary | ICD-10-CM

## 2020-12-07 LAB — CBG MONITORING, ED: Glucose-Capillary: 133 mg/dL — ABNORMAL HIGH (ref 70–99)

## 2020-12-07 LAB — CBC WITH DIFFERENTIAL/PLATELET
Abs Immature Granulocytes: 0.01 10*3/uL (ref 0.00–0.07)
Basophils Absolute: 0.1 10*3/uL (ref 0.0–0.1)
Basophils Relative: 1 %
Eosinophils Absolute: 0 10*3/uL (ref 0.0–0.5)
Eosinophils Relative: 0 %
HCT: 36.5 % (ref 36.0–46.0)
Hemoglobin: 12.7 g/dL (ref 12.0–15.0)
Immature Granulocytes: 0 %
Lymphocytes Relative: 21 %
Lymphs Abs: 1.3 10*3/uL (ref 0.7–4.0)
MCH: 31.8 pg (ref 26.0–34.0)
MCHC: 34.8 g/dL (ref 30.0–36.0)
MCV: 91.3 fL (ref 80.0–100.0)
Monocytes Absolute: 0.5 10*3/uL (ref 0.1–1.0)
Monocytes Relative: 8 %
Neutro Abs: 4.5 10*3/uL (ref 1.7–7.7)
Neutrophils Relative %: 70 %
Platelets: 155 10*3/uL (ref 150–400)
RBC: 4 MIL/uL (ref 3.87–5.11)
RDW: 12.9 % (ref 11.5–15.5)
WBC: 6.4 10*3/uL (ref 4.0–10.5)
nRBC: 0 % (ref 0.0–0.2)

## 2020-12-07 LAB — RAPID URINE DRUG SCREEN, HOSP PERFORMED
Amphetamines: NOT DETECTED
Barbiturates: NOT DETECTED
Benzodiazepines: NOT DETECTED
Cocaine: NOT DETECTED
Opiates: NOT DETECTED
Tetrahydrocannabinol: NOT DETECTED

## 2020-12-07 LAB — COMPREHENSIVE METABOLIC PANEL
ALT: 21 U/L (ref 0–44)
AST: 34 U/L (ref 15–41)
Albumin: 3.8 g/dL (ref 3.5–5.0)
Alkaline Phosphatase: 207 U/L — ABNORMAL HIGH (ref 38–126)
Anion gap: 10 (ref 5–15)
BUN: 7 mg/dL (ref 6–20)
CO2: 26 mmol/L (ref 22–32)
Calcium: 8.8 mg/dL — ABNORMAL LOW (ref 8.9–10.3)
Chloride: 102 mmol/L (ref 98–111)
Creatinine, Ser: 0.68 mg/dL (ref 0.44–1.00)
GFR, Estimated: >60 mL/min (ref >60)
Glucose, Bld: 132 mg/dL — ABNORMAL HIGH (ref 70–99)
Potassium: 3.3 mmol/L — ABNORMAL LOW (ref 3.5–5.1)
Sodium: 138 mmol/L (ref 135–145)
Total Bilirubin: 2.1 mg/dL — ABNORMAL HIGH (ref 0.3–1.2)
Total Protein: 7 g/dL (ref 6.5–8.1)

## 2020-12-07 LAB — URINALYSIS, ROUTINE W REFLEX MICROSCOPIC
Bilirubin Urine: NEGATIVE
Glucose, UA: NEGATIVE mg/dL
Ketones, ur: 5 mg/dL — AB
Leukocytes,Ua: NEGATIVE
Nitrite: NEGATIVE
Protein, ur: 100 mg/dL — AB
Specific Gravity, Urine: 1.023 (ref 1.005–1.030)
pH: 7 (ref 5.0–8.0)

## 2020-12-07 LAB — MAGNESIUM
Magnesium: 1.2 mg/dL — ABNORMAL LOW (ref 1.7–2.4)
Magnesium: 2.2 mg/dL (ref 1.7–2.4)

## 2020-12-07 LAB — BRAIN NATRIURETIC PEPTIDE: B Natriuretic Peptide: 132.9 pg/mL — ABNORMAL HIGH (ref 0.0–100.0)

## 2020-12-07 LAB — POTASSIUM: Potassium: 3.5 mmol/L (ref 3.5–5.1)

## 2020-12-07 LAB — I-STAT BETA HCG BLOOD, ED (MC, WL, AP ONLY): I-stat hCG, quantitative: <5 m[IU]/mL (ref <5)

## 2020-12-07 LAB — LIPASE, BLOOD: Lipase: 49 U/L (ref 11–51)

## 2020-12-07 LAB — TROPONIN I (HIGH SENSITIVITY): Troponin I (High Sensitivity): <2 ng/L (ref <18)

## 2020-12-07 LAB — BILIRUBIN, DIRECT: Bilirubin, Direct: 0.5 mg/dL — ABNORMAL HIGH (ref 0.0–0.2)

## 2020-12-07 MED ORDER — MAGNESIUM OXIDE -MG SUPPLEMENT 400 (240 MG) MG PO TABS: 400.0000 mg | ORAL_TABLET | Freq: Every day | ORAL | 0 refills | Status: DC

## 2020-12-07 MED ORDER — LORAZEPAM 0.5 MG PO TABS
0.5000 mg | ORAL_TABLET | Freq: Once | ORAL | Status: AC
  Administered 2020-12-07: 0.5 mg via SUBLINGUAL
  Filled 2020-12-07: qty 1

## 2020-12-07 MED ORDER — POTASSIUM CHLORIDE CRYS ER 20 MEQ PO TBCR
40.0000 meq | EXTENDED_RELEASE_TABLET | Freq: Once | ORAL | Status: AC
  Administered 2020-12-07: 40 meq via ORAL
  Filled 2020-12-07: qty 2

## 2020-12-07 MED ORDER — MAGNESIUM SULFATE 2 GM/50ML IV SOLN
2.0000 g | Freq: Once | INTRAVENOUS | Status: AC
  Administered 2020-12-07: 2 g via INTRAVENOUS
  Filled 2020-12-07: qty 50

## 2020-12-07 MED ORDER — MAGNESIUM OXIDE -MG SUPPLEMENT 400 (240 MG) MG PO TABS
400.0000 mg | ORAL_TABLET | Freq: Once | ORAL | Status: AC
  Administered 2020-12-07: 400 mg via ORAL
  Filled 2020-12-07: qty 1

## 2020-12-07 NOTE — ED Triage Notes (Signed)
Patient BIB POV. Patient describes having staring spells beginning on Wednesday. Patient husband describes patient had a seizure while watching TV last night unknown how long seizure last. Patient has non HX of seizures.

## 2020-12-07 NOTE — Discharge Instructions (Addendum)
Your presentation today is concerning for possible seizure-like activity versus syncope.  Given your tongue biting, concern for possible seizure-like activity.  Initial work-up was revealing for hypomagnesemia and hypokalemia.  Given your given your history of prolonged QT, low magnesium & potassium today, will be important to follow-up with your PCP next week for recheck of your electrolytes and repeat EKG.  I placed a referral to neurology for follow-up and cplease refrain from driving until cleared by a neurologist.  They may wish to perform an outpatient EEG which monitors your brainwave activity.

## 2020-12-07 NOTE — ED Provider Notes (Addendum)
[facility]-EMERGENCY DEPT Provider Note   CSN: [id number] Arrival date & time: 12/07/20  4098     History Chief Complaint  Patient presents with   Seizures    Kara [name] is a 35 y.o. female.   Seizures  35 year old female with a history of alcoholism in remission, anemia, CHF, GERD, chronic pancreatitis, prolonged QT syndrome who presents to the emergency department with concern for seizures.  The patient states that her husband noted while at dinner on Wednesday evening she had a brief "staring spell."  She subsequently returned to her baseline.  No jerking activity.  Last night, she was lying in bed with her husband when all of a sudden she exhibited generalized tonic-clonic jerking and unresponsiveness.  During this time she bit the lateral aspect of her tongue on the left and her left cheek.  No urinary incontinence.  The episode lasted around 30 seconds in total per her husband.  She subsequently had a postictal state of mild confusion before returning to her baseline.  No fevers or chills.  No headaches, vision changes or neck stiffness last night.  She denies a family history of seizure activity.  She denies any prior history of seizures or seizure-like activity.  Past Medical History:  Diagnosis Date   Alcoholism in remission (HCC)    per pt in remission since 2017   Anemia    AR (allergic rhinitis)    allergist-- dr Lucie Leather   Biliary dyskinesia    Diastolic CHF (HCC)    followd by dr Demetrius Charity. [facility] Emms   Environmental allergies    GERD (gastroesophageal reflux disease)    Headache    History of acute pancreatitis    alcoholic pancreatitis ,  2016 and 2017   History of sepsis 06/12/2016   with CAP   History of syncope    History of thrombosis 01/29/2015   splenic vein thrombosis-- treated with coumadin--- readmitted for abd. hematoma due to coumadin treated with IR embolization of the IMA branches   Hypertension    Polyneuropathy    Prolonged QT  syndrome    cardiologist-- dr Eden Emms (notes in epic)   Severe persistent asthma    pulmonologist-- dr dr Belva Crome (notes in epic)    Patient Active Problem List   Diagnosis Date Noted   Depression 08/05/2020   Acute on chronic pancreatitis (HCC) 11/06/2019   Elevated alkaline phosphatase level    Malnutrition of moderate degree 10/23/2019   Pancreatic pseudocyst    Pancreatic cyst    Fatty liver    Other allergic rhinitis 05/03/2019   Food allergy 05/03/2019   Tobacco smoker, less than 10 cigarettes per day 05/03/2019   Asthma, severe persistent, well-controlled 11/11/2017   Lung infiltrate    Hyponatremia 08/27/2016   Hyperglycemia 08/27/2016   Acute respiratory failure with hypoxia (HCC) 08/26/2016   HCAP (healthcare-associated pneumonia) 07/31/2016   Tobacco abuse 07/31/2016   Respiratory failure with hypoxia (HCC) 07/31/2016   CAP (community acquired pneumonia) 06/13/2016   Asthma exacerbation 06/12/2016   Alcohol abuse, in remission 06/12/2016   Sepsis (HCC) 06/12/2016   Pancreatitis 09/09/2015   Gastroesophageal reflux disease 09/09/2015   Benign essential HTN 09/09/2015   FUO (fever of unknown origin)    Abdominal hematoma 02/10/2015   Warfarin-induced coagulopathy (HCC) 02/10/2015   Hypertension 02/10/2015   Hypoalbuminemia due to protein-calorie malnutrition (HCC) 02/10/2015   Alcoholic pancreatitis 02/10/2015   Acute blood loss anemia    Intra-abdominal hematoma 02/09/2015   Pancreatitis, acute  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   Acute pancreatitis 01/29/2015   Sinus tachycardia 01/29/2015   Leukocytosis 01/29/2015   AKI (acute kidney injury) (HCC) 01/29/2015   Insomnia 10/29/2014   Polyneuropathy 09/02/2014   History of alcohol abuse 09/02/2014   Edema 09/02/2014   Neck pain 09/02/2014   Chronic lower back pain 09/02/2014   Numbness of foot 08/02/2014   Hand numbness 08/02/2014   Ataxic gait 08/02/2014   Proximal leg weakness 08/02/2014    Constipation    Abdominal pain 07/16/2014   Hypothermia 07/16/2014   Upper abdominal pain    Abdominal pain, epigastric 07/06/2014   Obesity (BMI 30-39.9) 07/06/2014   Chronic diastolic heart failure (HCC) 07/06/2014   Syncope 04/08/2014   Prolonged QT interval 04/08/2014   Hypokalemia 04/08/2014   Elevated LFTs 04/08/2014    Past Surgical History:  Procedure Laterality Date   CHOLECYSTECTOMY N/A 06/21/2018   Procedure: LAPAROSCOPIC CHOLECYSTECTOMY;  Surgeon: Abigail MiyamotoBlackman, Douglas, MD;  Location: [facility];  Service: General;  Laterality: N/A;   ESOPHAGOGASTRODUODENOSCOPY N/A 07/17/2014   Procedure: ESOPHAGOGASTRODUODENOSCOPY (EGD);  Surgeon: Dorena CookeyJohn Hayes, MD;  [facility];  Service: Endoscopy;  Laterality: N/A;   TYMPANOSTOMY TUBE PLACEMENT Bilateral 11 MONTHS OLD   VIDEO BRONCHOSCOPY Bilateral 10/08/2016   Procedure: VIDEO BRONCHOSCOPY WITH FLUORO;  Surgeon: Chilton GreathouseMannam, Praveen, MD;  Location: [facility];  Service: Cardiopulmonary;  Laterality: Bilateral;     OB History   No obstetric history on file.     Family History  Problem Relation Age of Onset   Heart failure Mother    Colon polyps Mother    Heart failure Father    Diabetes Father    Heart failure Maternal Grandfather    Colon cancer Neg Hx     Social History   Tobacco Use   Smoking status: Every Day    Packs/day: 0.50    Years: 5.00    Pack years: 2.50    Types: Cigarettes   Smokeless tobacco: Never  Vaping Use   Vaping Use: Never used  Substance Use Topics   Alcohol use: Not Currently    Alcohol/week: 0.0 standard drinks    Comment: pt hx alcohol abuse , in remission since 2017   Drug use: Not Currently    Comment: "POT WHEN I WAS IN HIGH SCHOOL"    Home Medications Prior to Admission medications   Medication Sig Start Date End Date Taking? Authorizing Provider  magnesium oxide (MAGOX 400) 400 (240 Mg) MG tablet Take 1 tablet (400 mg total) by mouth daily. 12/07/20  Yes Ernie AvenaLawsing, Taje Littler, MD  albuterol  (PROVENTIL) (2.5 MG/3ML) 0.083% nebulizer solution Take 3 mLs (2.5 mg total) by nebulization every 4 (four) hours as needed for wheezing or shortness of breath. 05/03/19   Hetty BlendAmbs, Anne M, FNP  albuterol (VENTOLIN HFA) 108 (90 Base) MCG/ACT inhaler 2 puffs every 4 hours as needed for coughing or wheezing. Patient taking differently: Inhale 2 puffs into the lungs every 6 (six) hours as needed for wheezing or shortness of breath. 2 puffs every 4 hours as needed for coughing or wheezing. 06/04/19   Hetty BlendAmbs, Anne M, FNP  BREO ELLIPTA 200-25 MCG/INH AEPB INHALE 1 PUFF INTO THE LUNGS DAILY 05/28/19   Lucie LeatherKozlow, Alvira PhilipsEric J, MD  buPROPion [facility](WELLBUTRIN SR) 150 MG 12 hr tablet Take 150-300 mg by mouth See admin instructions. Takes 1 tablet in the morning and 2 tablets at bedtime 04/10/19   [provider]  chlorhexidine (PERIDEX) 0.12 % solution Use as directed 15 mLs in  the mouth or throat 2 (two) times daily. After meals Patient taking differently: Use as directed 15 mLs in the mouth or throat daily as needed (dental plaque). After meals 05/03/19   Ambs, Norvel Richards, FNP  Cholecalciferol (VITAMIN D PO) Take 1,000 Units by mouth daily.    [provider]  DUPIXENT 300 MG/2ML prefilled syringe INJECT 300 MG SUBCUTANEOUSLY EVERY OTHER WEEK Patient taking differently: Inject 300 mg into the skin every 14 (fourteen) days. 06/02/20   Kozlow, Alvira Philips, MD  EPINEPHrine (EPIPEN 2-PAK) 0.3 mg/0.3 mL IJ SOAJ injection Inject 0.3 mLs (0.3 mg total) into the muscle once as needed (for severe allergic reaction). 07/11/18   Kozlow, Alvira Philips, MD  gabapentin (NEURONTIN) 300 MG capsule TAKE 3 CAPSULES BY MOUTH TWICE DAILY Patient taking differently: Take 900 mg by mouth 2 (two) times daily. 12/27/18   Sater, Pearletha Furl, MD  lipase/protease/amylase (CREON) 36000 UNITS CPEP capsule Take 72,000 Units by mouth 3 (three) times daily with meals. 04/28/20   [provider]  Melatonin 10 MG TABS Take 10 mg by mouth at bedtime.    [provider]  metoprolol succinate (TOPROL-XL) 50 MG 24 hr tablet Take 1 tablet (50 mg total) by mouth daily. 06/11/19   Wendall Stade, MD  montelukast (SINGULAIR) 10 MG tablet TAKE ONE TABLET BY MOUTH AT BEDTIME Patient taking differently: Take 10 mg by mouth at bedtime. 12/13/19   Hetty Blend, FNP  Multiple Vitamins-Minerals (MULTIVITAMIN WITH MINERALS) tablet Take 1 tablet by mouth daily.    [provider]  omeprazole (PRILOSEC) 40 MG capsule TAKE 1 CAPSULE(40 MG) BY MOUTH DAILY Patient taking differently: Take 40 mg by mouth daily. 01/08/20   Ambs, Norvel Richards, FNP  polyethylene glycol (MIRALAX / GLYCOLAX) 17 g packet Take 17 g by mouth daily as needed for mild constipation or moderate constipation. 08/10/20   Pokhrel, Rebekah Chesterfield, MD  QUEtiapine (SEROQUEL) 100 MG tablet Take 100 mg by mouth at bedtime. 03/23/19   [provider]  rosuvastatin (CRESTOR) 10 MG tablet Take 10 mg by mouth daily.    [provider]  sertraline (ZOLOFT) 50 MG tablet Take 75 mg by mouth daily.    [provider]  spironolactone (ALDACTONE) 25 MG tablet Take 25 mg by mouth daily as needed (leg swelling). 11/14/19   [provider]  thiamine 100 MG tablet Take 100 mg by mouth daily.    [provider]    Allergies    Acetaminophen, Nsaids, Peanuts [peanut oil], and Triamterene-hctz  Review of Systems   Review of Systems  Constitutional:  Negative for chills and fever.  HENT:  Negative for ear pain and sore throat.   Eyes:  Negative for pain and visual disturbance.  Respiratory:  Negative for cough and shortness of breath.   Cardiovascular:  Negative for chest pain and palpitations.  Gastrointestinal:  Negative for abdominal pain and vomiting.  Genitourinary:  Negative for dysuria and hematuria.  Musculoskeletal:  Negative for arthralgias and back pain.  Skin:  Negative for color change and rash.  Neurological:  Positive for seizures. Negative for syncope.  All  other systems reviewed and are negative.  Physical Exam Updated Vital Signs BP (!) 123/108   Pulse 83   Temp 98.6 F (37 C) (Oral)   Resp 16   Ht 5\' 5"  (1.651 m)   Wt 86 kg   SpO2 99%   BMI 31.55 kg/m   Physical Exam Vitals and nursing note reviewed.  Constitutional:      General: She is not in acute distress.    Appearance: She is well-developed.  HENT:     Head: Normocephalic and atraumatic.     Mouth/Throat:     Comments: Wound along left lateral tongue and cheek Eyes:     Conjunctiva/sclera: Conjunctivae normal.  Cardiovascular:     Rate and Rhythm: Normal rate and regular rhythm.     Heart sounds: No murmur heard. Pulmonary:     Effort: Pulmonary effort is normal. No respiratory distress.     Breath sounds: Normal breath sounds.  Abdominal:     Palpations: Abdomen is soft.     Tenderness: There is no abdominal tenderness.  Musculoskeletal:     Cervical back: Neck supple.  Skin:    General: Skin is warm and dry.  Neurological:     General: No focal deficit present.     Mental Status: She is alert and oriented to person, place, and time.     GCS: GCS eye subscore is 4. GCS verbal subscore is 5. GCS motor subscore is 6.     Cranial Nerves: Cranial nerves are intact.     Sensory: Sensation is intact.     Motor: Motor function is intact.     Coordination: Coordination is intact.     Gait: Gait is intact.    ED Results / Procedures / Treatments   Labs (all labs ordered are listed, but only abnormal results are displayed) Labs Reviewed  COMPREHENSIVE METABOLIC PANEL - Abnormal; Notable for the following components:      Result Value   Potassium 3.3 (*)    Glucose, Bld 132 (*)    Calcium 8.8 (*)    Alkaline Phosphatase 207 (*)    Total Bilirubin 2.1 (*)    All other components within normal limits  MAGNESIUM - Abnormal; Notable for the following components:   Magnesium 1.2 (*)    All other components within normal limits  URINALYSIS, ROUTINE W REFLEX  MICROSCOPIC - Abnormal; Notable for the following components:   Color, Urine AMBER (*)    APPearance HAZY (*)    Hgb urine dipstick MODERATE (*)    Ketones, ur 5 (*)    Protein, ur 100 (*)    Bacteria, UA RARE (*)    All other components within normal limits  BILIRUBIN, DIRECT - Abnormal; Notable for the following components:   Bilirubin, Direct 0.5 (*)    All other components within normal limits  BRAIN NATRIURETIC PEPTIDE - Abnormal; Notable for the following components:   B Natriuretic Peptide 132.9 (*)    All other components within normal limits  CBG MONITORING, ED - Abnormal; Notable for the following components:   Glucose-Capillary 133 (*)    All other components within normal limits  CBC WITH DIFFERENTIAL/PLATELET  RAPID URINE DRUG SCREEN, HOSP PERFORMED  LIPASE, BLOOD  MAGNESIUM  POTASSIUM  I-STAT BETA HCG BLOOD, ED ([facility], [facility], [facility] ONLY)  TROPONIN I (HIGH SENSITIVITY)    EKG EKG Interpretation  Date/Time:  Sunday December 07 2020 11:12:00 EDT Ventricular Rate:  79 PR Interval:  164 QRS Duration: 99 QT Interval:  471 QTC Calculation: 540 R Axis:   76 Text Interpretation: Sinus rhythm Nonspecific T abnrm, anterolateral leads Prolonged QT interval Confirmed by Ernie Avena ([id number]) on 12/07/2020 12:49:08 PM  Radiology CT HEAD WO CONTRAST  Result Date: 12/07/2020 CLINICAL DATA:  Possible seizure. Family member reports seizure activity last night. EXAM: CT HEAD WITHOUT CONTRAST TECHNIQUE: Contiguous  axial images were obtained from the base of the skull through the vertex without intravenous contrast. COMPARISON:  07/16/2014. FINDINGS: Brain: No evidence of acute infarction, hemorrhage, hydrocephalus, extra-axial collection or mass lesion/mass effect. Vascular: No hyperdense vessel or unexpected calcification. Skull: Normal. Negative for fracture or focal lesion. Sinuses/Orbits: Globes and orbits are unremarkable. Visualized sinuses are clear. Other: None. IMPRESSION: Normal  unenhanced CT scan of the brain. Electronically Signed   By: Amie Portland M.D.   On: 12/07/2020 10:54    Procedures Procedures   Medications Ordered in ED Medications  magnesium sulfate IVPB 2 g 50 mL (0 g Intravenous Stopped 12/07/20 1206)  magnesium oxide (MAG-OX) tablet 400 mg (400 mg Oral Given 12/07/20 1052)  potassium chloride SA (KLOR-CON) CR tablet 40 mEq (40 mEq Oral Given 12/07/20 1053)  LORazepam (ATIVAN) tablet 0.5 mg (0.5 mg Sublingual Given 12/07/20 1318)    ED Course  I have reviewed the triage vital signs and the nursing notes.  Pertinent labs & imaging results that were available during my care of the patient were reviewed by me and considered in my medical decision making (see chart for details).    MDM Rules/Calculators/A&P                           35 year old female with a history of alcoholism in remission, anemia, CHF, GERD, chronic pancreatitis, prolonged QT syndrome who presents to the emergency department with concern for seizures.  The patient describes 2 separate episodes.  One a staring spell that resolved briefly after a few seconds.  The second sounds more like a generalized tonic-clonic seizure that lasted around 30 seconds while the patient was lying in bed.  She did have lateral tongue and cheek biting during the episode.  No urinary incontinence.  There was a postictal state following.  The seizure was witnessed by her husband who was lying next to her in bed.  Of note, the patient does have a history of prolonged QTC and syncopal episodes as a result of this.  She has a history of alcohol induced chronic pancreatitis and does take Zofran at home.  The patient was advised to minimize consumption of medications that could further prolong her QTC.  On chart review, the patient does have a history of hypomagnesemia as well.  Differential diagnosis includes seizure versus syncope from possible cardiac arrhythmia.  On arrival, the patient was afebrile,  hemodynamically stable, at her baseline mental status.  She has a delayed presentation to the ED as her seizure occurred last night.  Her neurologic exam was unremarkable.  Her cardiovascular exam was unremarkable.  Her lungs were clear to auscultation bilaterally.  She was overall well-appearing and at her baseline mental status, alert and oriented x3.  The patient's last echocardiogram was in 2018 which revealed an LVEF of 60 to 65%, no wall motion abnormalities, no diastolic dysfunction, no significant valvular abnormalities. She has previously been evaluated by cardiology for her prolonged Qtc and was recommended observation. Per EMR notes, no organic etiology of her QTc prolongation has been identified.  Her EKG revealed a prolonged QT interval, ventricular rate 79, QTc 540, nonspecific T wave abnormalities present, no STEMI.  Her lungs were clear to auscultation bilaterally.  A CT head was performed which revealed no evidence of acute abnormalities intracranially.  Blood glucose was normal on arrival.  Urinalysis was positive for rare bacteria, negative nitrites, leukocytes, no evidence for UTI.  UDS was negative.  A BNP was nonspecifically elevated to 133.  Troponin was normal.  The patient denies any chest pain, heart palpitations, shortness of breath.  A CMP revealed hypokalemia to 3.3 and a magnesium was checked which revealed hypomagnesemia to 1.2.  The patient's electrolytes were replenished in the emergency department IV and orally. Improved on recheck. She had no episodes of cardiac arrhythmia on telemetry.  Given the patient's prolonged QT, hypomagnesemia, she is at risk for potential development of torsades, however, the patient's lateral tongue biting and witnessed generalized tonic-clonic jerking by her husband leads me to believe that this episode was more likely a seizure.  The patient was advised no driving until cleared by a neurologist.  A referral to her neurologist was placed.  No  mental status changes or seizure activity noted in the emergency department.  The patient is overall well-appearing with no complaints at this time.  She was advised to follow-up with her PCP and cardiology regarding her prolonged QT to discuss the potential for cardiac monitoring outpatient.  She was advised to hold on QT prolonging antiemetics such as Zofran, which she takes for chronic pancreatitis. She was advised to follow-up with a neurologist for outpatient EEG monitoring.  Overall well-appearing at this time and stable for discharge home with strict return precautions.   Discharge Instructions: Your presentation today is concerning for possible seizure-like activity versus syncope.  Given your tongue biting, concern for possible seizure-like activity.  Initial work-up was revealing for hypomagnesemia and hypokalemia.  Given your given your history of prolonged QT, low magnesium & potassium today, will be important to follow-up with your PCP next week for recheck of your electrolytes and repeat EKG.  I placed a referral to neurology for follow-up and please refrain from driving until cleared by a neurologist.  They may wish to perform an outpatient EEG which monitors your brainwave activity.   Final Clinical Impression(s) / ED Diagnoses Final diagnoses:  Hypokalemia  Hypomagnesemia  Elevated bilirubin  Prolonged QT interval  Seizure-like activity (HCC)    Rx / DC Orders ED Discharge Orders          Ordered    magnesium oxide (MAGOX 400) 400 (240 Mg) MG tablet  Daily        12/07/20 1053    Ambulatory referral to Neurology       Comments: An appointment is requested in approximately: 2 weeks   12/07/20 1404               Ernie Avena, MD 12/08/20 1236

## 2021-01-20 ENCOUNTER — Other Ambulatory Visit: Payer: Self-pay

## 2021-01-20 ENCOUNTER — Emergency Department (HOSPITAL_COMMUNITY): Payer: BC Managed Care – PPO

## 2021-01-20 ENCOUNTER — Ambulatory Visit (INDEPENDENT_AMBULATORY_CARE_PROVIDER_SITE_OTHER): Payer: BC Managed Care – PPO | Admitting: Neurology

## 2021-01-20 ENCOUNTER — Encounter: Payer: Self-pay | Admitting: Neurology

## 2021-01-20 ENCOUNTER — Encounter (HOSPITAL_COMMUNITY): Payer: Self-pay

## 2021-01-20 ENCOUNTER — Inpatient Hospital Stay (HOSPITAL_COMMUNITY)
Admission: EM | Admit: 2021-01-20 | Discharge: 2021-01-22 | DRG: 101 | Disposition: A | Discharge status: Home / Self Care | Payer: BC Managed Care – PPO | Source: Ambulatory Visit | Attending: Internal Medicine | Admitting: Internal Medicine

## 2021-01-20 VITALS — BP 130/90 | HR 86 | Ht 65.0 in | Wt 199.5 lb

## 2021-01-20 DIAGNOSIS — J4550 Severe persistent asthma, uncomplicated: Secondary | ICD-10-CM | POA: Diagnosis present

## 2021-01-20 DIAGNOSIS — I10 Essential (primary) hypertension: Secondary | ICD-10-CM | POA: Diagnosis present

## 2021-01-20 DIAGNOSIS — I110 Hypertensive heart disease with heart failure: Secondary | ICD-10-CM | POA: Diagnosis present

## 2021-01-20 DIAGNOSIS — Z833 Family history of diabetes mellitus: Secondary | ICD-10-CM

## 2021-01-20 DIAGNOSIS — I5032 Chronic diastolic (congestive) heart failure: Secondary | ICD-10-CM | POA: Diagnosis present

## 2021-01-20 DIAGNOSIS — E785 Hyperlipidemia, unspecified: Secondary | ICD-10-CM | POA: Diagnosis present

## 2021-01-20 DIAGNOSIS — R270 Ataxia, unspecified: Secondary | ICD-10-CM | POA: Insufficient documentation

## 2021-01-20 DIAGNOSIS — G40409 Other generalized epilepsy and epileptic syndromes, not intractable, without status epilepticus: Secondary | ICD-10-CM

## 2021-01-20 DIAGNOSIS — F32A Depression, unspecified: Secondary | ICD-10-CM | POA: Diagnosis present

## 2021-01-20 DIAGNOSIS — Z86718 Personal history of other venous thrombosis and embolism: Secondary | ICD-10-CM

## 2021-01-20 DIAGNOSIS — G4700 Insomnia, unspecified: Secondary | ICD-10-CM | POA: Diagnosis present

## 2021-01-20 DIAGNOSIS — E876 Hypokalemia: Secondary | ICD-10-CM | POA: Diagnosis not present

## 2021-01-20 DIAGNOSIS — R569 Unspecified convulsions: Secondary | ICD-10-CM

## 2021-01-20 DIAGNOSIS — K219 Gastro-esophageal reflux disease without esophagitis: Secondary | ICD-10-CM | POA: Diagnosis present

## 2021-01-20 DIAGNOSIS — Z79899 Other long term (current) drug therapy: Secondary | ICD-10-CM

## 2021-01-20 DIAGNOSIS — G4089 Other seizures: Secondary | ICD-10-CM | POA: Diagnosis not present

## 2021-01-20 DIAGNOSIS — G629 Polyneuropathy, unspecified: Secondary | ICD-10-CM | POA: Diagnosis not present

## 2021-01-20 DIAGNOSIS — W1830XA Fall on same level, unspecified, initial encounter: Secondary | ICD-10-CM | POA: Diagnosis present

## 2021-01-20 DIAGNOSIS — R748 Abnormal levels of other serum enzymes: Secondary | ICD-10-CM | POA: Diagnosis present

## 2021-01-20 DIAGNOSIS — S20222A Contusion of left back wall of thorax, initial encounter: Secondary | ICD-10-CM | POA: Diagnosis present

## 2021-01-20 DIAGNOSIS — Z9049 Acquired absence of other specified parts of digestive tract: Secondary | ICD-10-CM

## 2021-01-20 DIAGNOSIS — K861 Other chronic pancreatitis: Secondary | ICD-10-CM | POA: Diagnosis present

## 2021-01-20 DIAGNOSIS — F17210 Nicotine dependence, cigarettes, uncomplicated: Secondary | ICD-10-CM | POA: Diagnosis present

## 2021-01-20 DIAGNOSIS — G894 Chronic pain syndrome: Secondary | ICD-10-CM | POA: Diagnosis present

## 2021-01-20 DIAGNOSIS — M6282 Rhabdomyolysis: Secondary | ICD-10-CM | POA: Diagnosis present

## 2021-01-20 DIAGNOSIS — F1021 Alcohol dependence, in remission: Secondary | ICD-10-CM | POA: Diagnosis present

## 2021-01-20 DIAGNOSIS — Z20822 Contact with and (suspected) exposure to covid-19: Secondary | ICD-10-CM | POA: Diagnosis present

## 2021-01-20 DIAGNOSIS — Z8249 Family history of ischemic heart disease and other diseases of the circulatory system: Secondary | ICD-10-CM

## 2021-01-20 HISTORY — DX: Other generalized epilepsy and epileptic syndromes, not intractable, without status epilepticus: G40.409

## 2021-01-20 LAB — CBC
HCT: 33.3 % — ABNORMAL LOW (ref 36.0–46.0)
Hemoglobin: 11.2 g/dL — ABNORMAL LOW (ref 12.0–15.0)
MCH: 31.8 pg (ref 26.0–34.0)
MCHC: 33.6 g/dL (ref 30.0–36.0)
MCV: 94.6 fL (ref 80.0–100.0)
Platelets: 156 10*3/uL (ref 150–400)
RBC: 3.52 MIL/uL — ABNORMAL LOW (ref 3.87–5.11)
RDW: 14 % (ref 11.5–15.5)
WBC: 7.6 10*3/uL (ref 4.0–10.5)
nRBC: 0 % (ref 0.0–0.2)

## 2021-01-20 LAB — I-STAT BETA HCG BLOOD, ED (MC, WL, AP ONLY): I-stat hCG, quantitative: <5 m[IU]/mL (ref <5)

## 2021-01-20 LAB — RAPID URINE DRUG SCREEN, HOSP PERFORMED
Amphetamines: NOT DETECTED
Barbiturates: NOT DETECTED
Benzodiazepines: NOT DETECTED
Cocaine: NOT DETECTED
Opiates: POSITIVE — AB
Tetrahydrocannabinol: NOT DETECTED

## 2021-01-20 LAB — COMPREHENSIVE METABOLIC PANEL
ALT: 15 U/L (ref 0–44)
AST: 38 U/L (ref 15–41)
Albumin: 3.4 g/dL — ABNORMAL LOW (ref 3.5–5.0)
Alkaline Phosphatase: 203 U/L — ABNORMAL HIGH (ref 38–126)
Anion gap: 11 (ref 5–15)
BUN: 6 mg/dL (ref 6–20)
CO2: 25 mmol/L (ref 22–32)
Calcium: 8.9 mg/dL (ref 8.9–10.3)
Chloride: 100 mmol/L (ref 98–111)
Creatinine, Ser: 0.74 mg/dL (ref 0.44–1.00)
GFR, Estimated: >60 mL/min (ref >60)
Glucose, Bld: 117 mg/dL — ABNORMAL HIGH (ref 70–99)
Potassium: 3 mmol/L — ABNORMAL LOW (ref 3.5–5.1)
Sodium: 136 mmol/L (ref 135–145)
Total Bilirubin: 1.4 mg/dL — ABNORMAL HIGH (ref 0.3–1.2)
Total Protein: 6.6 g/dL (ref 6.5–8.1)

## 2021-01-20 LAB — CK: Total CK: 1102 U/L — ABNORMAL HIGH (ref 38–234)

## 2021-01-20 MED ORDER — MELATONIN 5 MG PO TABS
10.0000 mg | ORAL_TABLET | Freq: Every day | ORAL | Status: DC
  Administered 2021-01-20 – 2021-01-21 (×2): 10 mg via ORAL
  Filled 2021-01-20 (×2): qty 2

## 2021-01-20 MED ORDER — METOPROLOL SUCCINATE ER 25 MG PO TB24
50.0000 mg | ORAL_TABLET | Freq: Every day | ORAL | Status: DC
  Administered 2021-01-21: 50 mg via ORAL
  Filled 2021-01-20: qty 2

## 2021-01-20 MED ORDER — MONTELUKAST SODIUM 10 MG PO TABS
10.0000 mg | ORAL_TABLET | Freq: Every day | ORAL | Status: DC
  Administered 2021-01-20 – 2021-01-21 (×2): 10 mg via ORAL
  Filled 2021-01-20 (×2): qty 1

## 2021-01-20 MED ORDER — ALBUTEROL SULFATE HFA 108 (90 BASE) MCG/ACT IN AERS: 2.0000 | INHALATION_SPRAY | Freq: Four times a day (QID) | RESPIRATORY_TRACT | Status: DC | PRN

## 2021-01-20 MED ORDER — GABAPENTIN 300 MG PO CAPS
900.0000 mg | ORAL_CAPSULE | Freq: Two times a day (BID) | ORAL | Status: DC
  Administered 2021-01-21 (×2): 900 mg via ORAL
  Filled 2021-01-20 (×2): qty 3

## 2021-01-20 MED ORDER — POTASSIUM CHLORIDE IN NACL 40-0.9 MEQ/L-% IV SOLN
INTRAVENOUS | Status: DC
  Filled 2021-01-20 (×2): qty 1000

## 2021-01-20 MED ORDER — POLYETHYLENE GLYCOL 3350 17 G PO PACK: 17.0000 g | PACK | Freq: Every day | ORAL | Status: DC | PRN

## 2021-01-20 MED ORDER — LORAZEPAM 2 MG/ML IJ SOLN: 4.0000 mg | INTRAMUSCULAR | Status: DC | PRN

## 2021-01-20 MED ORDER — POTASSIUM CHLORIDE CRYS ER 20 MEQ PO TBCR
40.0000 meq | EXTENDED_RELEASE_TABLET | Freq: Once | ORAL | Status: AC
  Administered 2021-01-20: 40 meq via ORAL
  Filled 2021-01-20: qty 2

## 2021-01-20 MED ORDER — OXYCODONE HCL 5 MG PO TABS
15.0000 mg | ORAL_TABLET | Freq: Four times a day (QID) | ORAL | Status: DC | PRN
  Administered 2021-01-21 – 2021-01-22 (×4): 15 mg via ORAL
  Filled 2021-01-20 (×4): qty 3

## 2021-01-20 MED ORDER — GADOBUTROL 1 MMOL/ML IV SOLN
9.0000 mL | Freq: Once | INTRAVENOUS | Status: AC | PRN
  Administered 2021-01-20: 9 mL via INTRAVENOUS

## 2021-01-20 MED ORDER — PANCRELIPASE (LIP-PROT-AMYL) 36000-114000 UNITS PO CPEP
72000.0000 [IU] | ORAL_CAPSULE | Freq: Three times a day (TID) | ORAL | Status: DC
  Administered 2021-01-21 – 2021-01-22 (×2): 72000 [IU] via ORAL
  Filled 2021-01-20 (×5): qty 2

## 2021-01-20 MED ORDER — ROSUVASTATIN CALCIUM 5 MG PO TABS
10.0000 mg | ORAL_TABLET | Freq: Every day | ORAL | Status: DC
  Administered 2021-01-21: 10 mg via ORAL
  Filled 2021-01-20: qty 2

## 2021-01-20 MED ORDER — FLUTICASONE FUROATE-VILANTEROL 200-25 MCG/INH IN AEPB
1.0000 | INHALATION_SPRAY | Freq: Every day | RESPIRATORY_TRACT | Status: DC
  Filled 2021-01-20: qty 28

## 2021-01-20 MED ORDER — ENOXAPARIN SODIUM 40 MG/0.4ML IJ SOSY
40.0000 mg | PREFILLED_SYRINGE | Freq: Every day | INTRAMUSCULAR | Status: DC
  Administered 2021-01-20 – 2021-01-21 (×2): 40 mg via SUBCUTANEOUS
  Filled 2021-01-20 (×2): qty 0.4

## 2021-01-20 NOTE — Progress Notes (Signed)
[facility]  PATIENT: Kara [name] DOB: 18-Jun-1985  REFERRING DOCTOR OR PCP:  Gildardo Cranker, MD SOURCE: patient, notes from primary care, notes from neurology 2018/2019, imaging from 2016.  _________________________________   HISTORICAL  CHIEF COMPLAINT:  Chief Complaint  Patient presents with   Consult    Rm 1, alone. ED referral for SZ. Pt states a few weeks ago while laying in bed pt appeared to have had a sx, biting down her tongue and having non memory of incident afterwards. Weeks before sz, patient would zone out. No other sz like activity since.  Pt reports couple weeks before she had problem with her dexterity and was unable to grab and tie her hair.     HISTORY OF PRESENT ILLNESS:  I had the pleasure of seeing your patient, Kara [name], at [facility] for neurologic consultation regarding a witnessed seizure 12/07/2020  She was watching TV in bed and next thing she remembers is her husband yelling "Are you ok are you ok".   He witnessed her having a generalized tonic clonic seizure lasting 30 seconds   Arms clenched up and then she started to shake.   She bit the the inside of her mouth.   She had no incontinence.  She was back to baseline fairly quickly.   Her husband told her later that she had a couple staring spells while eating a couple days earlier.   The 24 hours before the event were fairly normal.   No alcohol or drug use.  She thinks she had slept well the previous night.    She has no personal history of seizures  She used to drink daily but is sober x 4 years.   She has no FH of seizures.   Her paternal aunt had MS.      She had a 2 day episode of dexterity issues/ataxia with her left hand.   The right was fine.   She felt clumsy in the left arm but not the left leg.   There was no spasticity.   She showed me a video and she had difficulty grasping a bottle and doing fine motor tasks with the left hand.     I saw her 4 years  ago for numbness and mild sensory polyneuropathy.   Gabapentin helped the symptoms and she notes no new issues.   This issue has not worsened over the last 4 years..   She fell recently (she tripped and this was not near the time of the seizure) had a fall on asphalt and has abrasions on legs/toes.    --- While being examined, she had a seizure witnessed by me.  She had just stood up for me to assess her gait.  She took a few steps.  She then became rigid and fell backwards against the wall.  She did fall to the floor with her head hitting (slid from the wall but had did hit from about a foot up).   She then had 30 seconds of bilateral symmetric clonic activity.  She then became still and was unresponsive for about 1 minute and then was responsive to localize sound.  She began to speak about 3 to 4 minutes later.  About 10 minutes after the seizure she was able to answer detailed questions.    She does not have memory of the event.  Afterwards, she complained of a sore throat.  She did not bite her tongue.  There was no incontinence.  She  did vomit x1 about 10 minutes after the event.  She denied headache or other issue.  She did not have any numbness or weakness.  IMAGING REVIEWED CT Head 12/07/2020 was normal.     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 There is right greater than left foraminal narrowing. There is no spinal cord abnormality noted.     MRI of the  brain 07/27/2014 was normal.       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    REVIEW OF SYSTEMS: Constitutional: No fevers, chills, sweats, or change in appetite Eyes: No visual changes, double vision, eye pain Ear, nose and throat: No hearing loss, ear pain, nasal congestion, sore throat Cardiovascular: No chest pain, palpitations Respiratory:  No shortness of breath at rest or with exertion.   No wheezes GastrointestinaI: No nausea, vomiting, diarrhea, abdominal pain,  fecal incontinence Genitourinary:  No dysuria, urinary retention or frequency.  No nocturia. Musculoskeletal:  No neck pain, back pain Integumentary: No rash, pruritus, skin lesions Neurological: as above Psychiatric: No depression at this time.  No anxiety Endocrine: No palpitations, diaphoresis, change in appetite, change in weigh or increased thirst Hematologic/Lymphatic:  No anemia, purpura, petechiae. Allergic/Immunologic: No itchy/runny eyes, nasal congestion, recent allergic reactions, rashes  ALLERGIES: Allergies  Allergen Reactions   Acetaminophen Other (See Comments)    Gi Upset   Nsaids Other (See Comments)    Upset stomach    Peanuts [Peanut Oil] Hives and Itching    "ONLY TREE NUTS"   Triamterene-Hctz Nausea Only    dizzy    HOME MEDICATIONS:  Current Outpatient Medications:    albuterol (PROVENTIL) (2.5 MG/3ML) 0.083% nebulizer solution, Take 3 mLs (2.5 mg total) by nebulization every 4 (four) hours as needed for wheezing or shortness of breath., Disp: 75 mL, Rfl: 1   albuterol (VENTOLIN HFA) 108 (90 Base) MCG/ACT inhaler, 2 puffs every 4 hours as needed for coughing or wheezing. (Patient taking differently: Inhale 2 puffs into the lungs every 6 (six) hours as needed for wheezing or shortness of breath. 2 puffs every 4 hours as needed for coughing or wheezing.), Disp: 18 g, Rfl: 1   BREO ELLIPTA 200-25 MCG/INH AEPB, INHALE 1 PUFF INTO THE LUNGS DAILY, Disp: 180 each, Rfl: 1   buPROPion (WELLBUTRIN SR) 150 MG 12 hr tablet, Take 150-300 mg by mouth See admin instructions. Takes 1 tablet in the morning and 2 tablets at bedtime, Disp: , Rfl:    chlorhexidine (PERIDEX) 0.12 % solution, Use as directed 15 mLs in the mouth or throat 2 (two) times daily. After meals (Patient taking differently: Use as directed 15 mLs in the mouth or throat daily as needed (dental plaque). After meals), Disp: 2700 mL, Rfl: 1   Cholecalciferol (VITAMIN D PO), Take 1,000 Units by mouth daily., Disp:  , Rfl:    DUPIXENT 300 MG/2ML prefilled syringe, INJECT 300 MG SUBCUTANEOUSLY EVERY OTHER WEEK (Patient taking differently: Inject 300 mg into the skin every 14 (fourteen) days.), Disp: 4 mL, Rfl: 11   EPINEPHrine (EPIPEN 2-PAK) 0.3 mg/0.3 mL IJ SOAJ injection, Inject 0.3 mLs (0.3 mg total) into the muscle once as needed (for severe allergic reaction)., Disp: 2 Device, Rfl: 1   gabapentin (NEURONTIN) 300 MG capsule, TAKE 3 CAPSULES BY MOUTH TWICE DAILY (Patient taking differently: Take 900 mg by mouth 2 (two) times daily.), Disp: 180 capsule, Rfl: 0   lipase/protease/amylase (CREON) 36000 UNITS CPEP capsule, Take 72,000 Units  by mouth 3 (three) times daily with meals., Disp: , Rfl:    magnesium oxide (MAGOX 400) 400 (240 Mg) MG tablet, Take 1 tablet (400 mg total) by mouth daily., Disp: 30 tablet, Rfl: 0   Melatonin 10 MG TABS, Take 10 mg by mouth at bedtime., Disp: , Rfl:    metoprolol succinate (TOPROL-XL) 50 MG 24 hr tablet, Take 1 tablet (50 mg total) by mouth daily., Disp: 90 tablet, Rfl: 0   montelukast (SINGULAIR) 10 MG tablet, TAKE ONE TABLET BY MOUTH AT BEDTIME (Patient taking differently: Take 10 mg by mouth at bedtime.), Disp: 30 tablet, Rfl: 0   Multiple Vitamins-Minerals (MULTIVITAMIN WITH MINERALS) tablet, Take 1 tablet by mouth daily., Disp: , Rfl:    omeprazole (PRILOSEC) 40 MG capsule, TAKE 1 CAPSULE(40 MG) BY MOUTH DAILY (Patient taking differently: Take 40 mg by mouth daily.), Disp: 30 capsule, Rfl: 0   oxyCODONE (ROXICODONE) 15 MG immediate release tablet, Take 15 mg by mouth as needed for pain., Disp: , Rfl:    polyethylene glycol (MIRALAX / GLYCOLAX) 17 g packet, Take 17 g by mouth daily as needed for mild constipation or moderate constipation., Disp: 14 each, Rfl: 0   QUEtiapine (SEROQUEL) 100 MG tablet, Take 100 mg by mouth at bedtime., Disp: , Rfl:    rosuvastatin (CRESTOR) 10 MG tablet, Take 10 mg by mouth daily., Disp: , Rfl:    sertraline (ZOLOFT) 50 MG tablet, Take 75 mg  by mouth daily., Disp: , Rfl:    spironolactone (ALDACTONE) 25 MG tablet, Take 25 mg by mouth daily as needed (leg swelling)., Disp: , Rfl:    thiamine 100 MG tablet, Take 100 mg by mouth daily., Disp: , Rfl:   PAST MEDICAL HISTORY: Past Medical History:  Diagnosis Date   Alcoholism in remission (HCC)    per pt in remission since 2017   Anemia    AR (allergic rhinitis)    allergist-- dr Lucie Leather   Biliary dyskinesia    Diastolic CHF (HCC)    followd by dr Demetrius Charity. [facility]   Environmental allergies    GERD (gastroesophageal reflux disease)    Headache    History of acute pancreatitis    alcoholic pancreatitis ,  2016 and 2017   History of sepsis 06/12/2016   with CAP   History of syncope    History of thrombosis 01/29/2015   splenic vein thrombosis-- treated with coumadin--- readmitted for abd. hematoma due to coumadin treated with IR embolization of the IMA branches   Hypertension    Polyneuropathy    Prolonged QT syndrome    cardiologist-- dr Eden Emms (notes in epic)   Severe persistent asthma    pulmonologist-- dr dr Belva Crome (notes in epic)    PAST SURGICAL HISTORY: Past Surgical History:  Procedure Laterality Date   CHOLECYSTECTOMY N/A 06/21/2018   Procedure: LAPAROSCOPIC CHOLECYSTECTOMY;  Surgeon: Abigail Miyamoto, MD;  Location: [facility];  Service: General;  Laterality: N/A;   ESOPHAGOGASTRODUODENOSCOPY N/A 07/17/2014   Procedure: ESOPHAGOGASTRODUODENOSCOPY (EGD);  Surgeon: Dorena Cookey, MD;  Location: [facility];  Service: Endoscopy;  Laterality: N/A;   TYMPANOSTOMY TUBE PLACEMENT Bilateral 11 MONTHS OLD   VIDEO BRONCHOSCOPY Bilateral 10/08/2016   Procedure: VIDEO BRONCHOSCOPY WITH FLUORO;  Surgeon: Chilton Greathouse, MD;  Location: [facility];  Service: Cardiopulmonary;  Laterality: Bilateral;    FAMILY HISTORY: Family History  Problem Relation Age of Onset   Heart failure Mother    Colon polyps Mother    Heart failure Father  Diabetes Father    Heart failure Maternal  Grandfather    Colon cancer Neg Hx     SOCIAL HISTORY:  Social History   Socioeconomic History   Marital status: Married    Spouse name: [name]   Number of children: 0   Years of education: Not on file   Highest education level: Not on file  Occupational History   Occupation: Engineer, production  Tobacco Use   Smoking status: Every Day    Packs/day: 0.50    Years: 5.00    Pack years: 2.50    Types: Cigarettes   Smokeless tobacco: Never  Vaping Use   Vaping Use: Never used  Substance and Sexual Activity   Alcohol use: Not Currently    Alcohol/week: 0.0 standard drinks    Comment: pt hx alcohol abuse , in remission since 2017   Drug use: Not Currently    Comment: "POT WHEN I WAS IN HIGH SCHOOL"   Sexual activity: Yes    Birth control/protection: None  Other Topics Concern   Not on file  Social History Narrative   Married.   Social Determinants of Health   Financial Resource Strain: Not on file  Food Insecurity: Not on file  Transportation Needs: Not on file  Physical Activity: Not on file  Stress: Not on file  Social Connections: Not on file  Intimate Partner Violence: Not on file     PHYSICAL EXAM  Vitals:   01/20/21 1438 01/20/21 1530  BP: 130/88 130/90  Pulse: 86   Weight: 199 lb 8 oz (90.5 kg)   Height: 5\' 5"  (1.651 m)     Body mass index is 33.2 kg/m.   General: The patient is well-developed and well-nourished and in no acute distress  HEENT:  Head is [place]/AT.  Sclera are anicteric.  Funduscopic exam shows normal optic discs and retinal vessels.  Neck: No carotid bruits are noted.  The neck is nontender.  Cardiovascular: The heart has a regular rate and rhythm with a normal S1 and S2. There were no murmurs, gallops or rubs.    Skin: Extremities are without rash or  edema.  Musculoskeletal:  Back is nontender  Neurologic Exam  Mental status: The patient is alert and oriented x 3 at the time of the examination. The patient has apparent  normal recent and remote memory, with an apparently normal attention span and concentration ability.   Speech is normal.  Cranial nerves: Extraocular movements are full. Pupils are equal, round, and reactive to light and accomodation.  Visual fields are full.  Facial symmetry is present. There is good facial sensation to soft touch bilaterally.Facial strength is normal.  Trapezius and sternocleidomastoid strength is normal. No dysarthria is noted.  The tongue is midline, and the patient has symmetric elevation of the soft palate. No obvious hearing deficits are noted.  Motor:  Muscle bulk is normal.   Tone is normal. Strength is  5 / 5 in all 4 extremities.   Sensory: Sensory testing is intact to pinprick, soft touch and vibration sensation in arms.  Reduced vibration sensation in toes..  Coordination: Cerebellar testing reveals good finger-nose-finger and heel-to-shin bilaterally.  Gait and station: Station is normal.   Gait and tandem gait were normal.  Reflexes: Deep tendon reflexes are symmetric and normal bilaterally.   Plantar responses are flexor.    DIAGNOSTIC DATA (LABS, IMAGING, TESTING) - I reviewed patient records, labs, notes, testing and imaging myself where available.  Lab Results  Component Value  Date   WBC 6.4 12/07/2020   HGB 12.7 12/07/2020   HCT 36.5 12/07/2020   MCV 91.3 12/07/2020   PLT 155 12/07/2020      Component Value Date/Time   NA 138 12/07/2020 1003   NA 139 10/15/2016 0825   K 3.5 12/07/2020 1326   CL 102 12/07/2020 1003   CO2 26 12/07/2020 1003   GLUCOSE 132 (H) 12/07/2020 1003   BUN 7 12/07/2020 1003   BUN 11 10/15/2016 0825   CREATININE 0.68 12/07/2020 1003   CALCIUM 8.8 (L) 12/07/2020 1003   PROT 7.0 12/07/2020 1003   PROT 7.6 09/02/2014 1507   ALBUMIN 3.8 12/07/2020 1003   ALBUMIN 4.1 09/02/2014 1507   AST 34 12/07/2020 1003   ALT 21 12/07/2020 1003   ALKPHOS 207 (H) 12/07/2020 1003   BILITOT 2.1 (H) 12/07/2020 1003   BILITOT 0.7  09/02/2014 1507   GFRNONAA >60 12/07/2020 1003   GFRAA >60 11/12/2019 0435   Lab Results  Component Value Date   CHOL 174 09/09/2015   HDL 57 09/09/2015   LDLCALC 100 (H) 09/09/2015   TRIG 112 10/17/2019   CHOLHDL 3.1 09/09/2015   Lab Results  Component Value Date   HGBA1C 5.6 08/01/2016   Lab Results  Component Value Date   VITAMINB12 632 09/02/2014   Lab Results  Component Value Date   TSH 0.219 (L) 06/13/2016       ASSESSMENT AND PLAN  Generalized tonic-clonic seizure (HCC)  Polyneuropathy  Ataxia  In summary, Ms. Kimberlin is a 35 year old woman who had 1 seizure 6 weeks ago and another seizure during her examination today.  Both were generalized tonic-clonic lasting about 30 to 45 seconds.    She was standing up during the second seizure.  Although she hit her back against the wall as the seizure started, the head did fall the last foot was so to the ground.  Currently, she has a nonfocal neurologic examination (mild old reduced sensation in toes) but has nausea and vomited.  EMT was called and she will be brought to [facility]. Because of the fall and the nausea and vomiting, acute changes need to be ruled out recommend imaging of the head and neck. 2 to 3 weeks ago, she had 2 days of ataxia in the left arm that did not appear to be associated with a seizure.  Recommend MRI imaging of the brain to further evaluate.  She does have a family history of MS.  MRI of the brain was normal in 2016. At this is her second seizure she would need to be started on an antiepileptic medication such as levetiracetam.  EEG if available Recommend tox  screen, CBC/diff and CMP   She will follow-up with [place] after her hospital visit and should call if new or worsening neurologic symptoms   Albertia Carvin A. Epimenio Foot, MD, [facility] 01/20/2021, 3:39 PM Certified in Neurology, Clinical Neurophysiology, Sleep Medicine and Neuroimaging  [facility] [address], [address] [place], [place] [address] [phone]

## 2021-01-20 NOTE — ED Triage Notes (Signed)
BIB EMS from her neurologist for witnessed seizure that lasted for 45-60s. This was her second seizure, first one was three weeks ago.Pt was post ictal for with nausea, vomitng and headache. Pt is now alert and oriented. No loss of B&B.

## 2021-01-20 NOTE — ED Provider Notes (Signed)
[facility] EMERGENCY DEPARTMENT Provider Note   CSN: [id number] Arrival date & time: 01/20/21  1607     History Chief Complaint  Patient presents with   Seizures    Kara [name] is a 35 y.o. female with past medical history of AUD, severe allergies and asthma, diastolic heart failure, chronic pancreatitis, hypertension, polyneuropathy, seizure who presents for evaluation after a witnessed seizure at her neurologist office earlier today.  She reports having no memory of the event however notes that the seizure was preceded by nausea and vomiting.  On presentation she reports feeling very tired with a bilateral temporal and orbital headache.  This is different from her last seizure around 1 month ago which was not preceded by nausea and vomiting.  Her first seizure 1 month ago happened while she was relaxing in bed.  She was reported to have been staring off into space with arms clenched to her chest and shaking for around 30 seconds.  She bit her tongue and cheek at that time but did not experience incontinence.  The episode today occurred at a neurology follow-up for the original seizure when the patient was standing to walk and became rigid and fell backwards into a wall with headstrike against the wall followed by 30s of bilateral symmetric clonic activity followed by stillness and unresponsiveness for 1 minute. During this seizure, she again bit her tongue and cheek but did not have incontinence.  She denies any neurological changes such as changes in her vision, smell, taste, worsening of balance.  She does report over the last few months having an increase in frequency of headaches to around 1 time per month which is different from baseline of never having headaches.  These are sometimes severe in nature, causing difficulty with her vision.  She denies any recent life changes or abnormal stressors.  She denies recent medication changes.  She denies alcohol, illicit drug use but  does smoke 5 to 6 cigarettes/day.  She does not had any changes in p.o. intake, bowel and bladder habits have not changed recently.  She denies a history of syncope.  Of note, patient does have history of recurrent pancreatitis for which she is followed by the pain clinic for oxycodone for severe pain episodes.  She does not take this medication daily, only with severe exacerbations of pain.  There is no family history of seizures.  She does report that her dad's sister had multiple sclerosis however there is no other neurological history in the family.    Past Medical History:  Diagnosis Date   Alcoholism in remission (HCC)    per pt in remission since 2017   Anemia    AR (allergic rhinitis)    allergist-- dr Lucie Leather   Biliary dyskinesia    Diastolic CHF (HCC)    followd by dr Demetrius Charity. [facility] Emms   Environmental allergies    GERD (gastroesophageal reflux disease)    Headache    History of acute pancreatitis    alcoholic pancreatitis ,  2016 and 2017   History of sepsis 06/12/2016   with CAP   History of syncope    History of thrombosis 01/29/2015   splenic vein thrombosis-- treated with coumadin--- readmitted for abd. hematoma due to coumadin treated with IR embolization of the IMA branches   Hypertension    Polyneuropathy    Prolonged QT syndrome    cardiologist-- dr Eden Emms (notes in epic)   Severe persistent asthma    pulmonologist-- dr dr Demetrius Charity. [name] (  notes in epic)    Patient Active Problem List   Diagnosis Date Noted   Generalized tonic-clonic seizure (HCC) 01/20/2021   Ataxia 01/20/2021   Depression 08/05/2020   Acute on chronic pancreatitis (HCC) 11/06/2019   Elevated CK    Malnutrition of moderate degree 10/23/2019   Pancreatic pseudocyst    Pancreatic cyst    Fatty liver    Other allergic rhinitis 05/03/2019   Food allergy 05/03/2019   Tobacco smoker, less than 10 cigarettes per day 05/03/2019   Asthma, severe persistent, well-controlled 11/11/2017   Lung infiltrate     Hyponatremia 08/27/2016   Hyperglycemia 08/27/2016   Acute respiratory failure with hypoxia (HCC) 08/26/2016   HCAP (healthcare-associated pneumonia) 07/31/2016   Tobacco abuse 07/31/2016   Respiratory failure with hypoxia (HCC) 07/31/2016   CAP (community acquired pneumonia) 06/13/2016   Asthma exacerbation 06/12/2016   Alcohol abuse, in remission 06/12/2016   Sepsis (HCC) 06/12/2016   Chronic pancreatitis (HCC) 09/09/2015   Gastroesophageal reflux disease 09/09/2015   Benign essential HTN 09/09/2015   FUO (fever of unknown origin)    Abdominal hematoma 02/10/2015   Warfarin-induced coagulopathy (HCC) 02/10/2015   Hypertension 02/10/2015   Hypoalbuminemia due to protein-calorie malnutrition (HCC) 02/10/2015   Alcoholic pancreatitis 02/10/2015   Acute blood loss anemia    Intra-abdominal hematoma 02/09/2015   Pancreatitis, acute    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   Acute pancreatitis 01/29/2015   Sinus tachycardia 01/29/2015   Leukocytosis 01/29/2015   AKI (acute kidney injury) (HCC) 01/29/2015   Insomnia 10/29/2014   Polyneuropathy 09/02/2014   History of alcohol abuse 09/02/2014   Edema 09/02/2014   Neck pain 09/02/2014   Chronic lower back pain 09/02/2014   Numbness of foot 08/02/2014   Hand numbness 08/02/2014   Ataxic gait 08/02/2014   Proximal leg weakness 08/02/2014   Constipation    Abdominal pain 07/16/2014   Hypothermia 07/16/2014   Upper abdominal pain    Abdominal pain, epigastric 07/06/2014   Obesity (BMI 30-39.9) 07/06/2014   Chronic diastolic heart failure (HCC) 07/06/2014   Syncope 04/08/2014   Prolonged QT interval 04/08/2014   Hypokalemia 04/08/2014   Elevated LFTs 04/08/2014    Past Surgical History:  Procedure Laterality Date   CHOLECYSTECTOMY N/A 06/21/2018   Procedure: LAPAROSCOPIC CHOLECYSTECTOMY;  Surgeon: Abigail Miyamoto, MD;  Location: [facility];  Service: General;  Laterality: N/A;   ESOPHAGOGASTRODUODENOSCOPY N/A 07/17/2014    Procedure: ESOPHAGOGASTRODUODENOSCOPY (EGD);  Surgeon: Dorena Cookey, MD;  Location: [facility];  Service: Endoscopy;  Laterality: N/A;   TYMPANOSTOMY TUBE PLACEMENT Bilateral 11 MONTHS OLD   VIDEO BRONCHOSCOPY Bilateral 10/08/2016   Procedure: VIDEO BRONCHOSCOPY WITH FLUORO;  Surgeon: Chilton Greathouse, MD;  Location: [facility];  Service: Cardiopulmonary;  Laterality: Bilateral;     OB History   No obstetric history on file.     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 Authorizing Provider  albuterol (PROVENTIL) (2.5 MG/3ML) 0.083% nebulizer solution Take 3 mLs (2.5 mg total) by nebulization every 4 (four) hours as needed for wheezing or shortness of breath. 05/03/19   Hetty Blend, FNP  albuterol (VENTOLIN HFA) 108 (90 Base) MCG/ACT inhaler 2 puffs every 4 hours as needed for coughing or wheezing. Patient taking differently: Inhale 2 puffs into the lungs every 6 (six) hours as needed for wheezing or shortness of breath. 2 puffs every 4 hours as needed for coughing or wheezing. 06/04/19   Hetty Blend, FNP  BREO ELLIPTA 200-25 MCG/INH AEPB INHALE 1 PUFF INTO THE LUNGS DAILY 05/28/19   Lucie Leather, Alvira Philips, MD  buPROPion [facility] SR) 150 MG 12 hr tablet Take 150-300 mg by mouth See admin instructions. Takes 1 tablet in the  morning and 2 tablets at bedtime 04/10/19   [provider]  chlorhexidine (PERIDEX) 0.12 % solution Use as directed 15 mLs in the mouth or throat 2 (two) times daily. After meals Patient taking differently: Use as directed 15 mLs in the mouth or throat daily as needed (dental plaque). After meals 05/03/19   Ambs, Norvel Richards, FNP  Cholecalciferol (VITAMIN D PO) Take 1,000 Units by mouth daily.    [provider]  DUPIXENT 300 MG/2ML prefilled syringe INJECT 300 MG SUBCUTANEOUSLY EVERY OTHER WEEK Patient taking differently: Inject 300 mg into the skin every 14 (fourteen) days. 06/02/20   Kozlow, Alvira Philips, MD  EPINEPHrine (EPIPEN 2-PAK) 0.3 mg/0.3 mL IJ SOAJ injection Inject 0.3 mLs (0.3 mg total) into the muscle once as needed (for severe allergic reaction). 07/11/18   Kozlow, Alvira Philips, MD  gabapentin (NEURONTIN) 300 MG capsule TAKE 3 CAPSULES BY MOUTH TWICE DAILY Patient taking differently: Take 900 mg by mouth 2 (two) times daily. 12/27/18   Sater, Pearletha Furl, MD  lipase/protease/amylase (CREON) 36000 UNITS CPEP capsule Take 72,000 Units by mouth 3 (three) times daily with meals. 04/28/20   [provider]  magnesium oxide (MAGOX 400) 400 (240 Mg) MG tablet Take 1 tablet (400 mg total) by mouth daily. 12/07/20   Ernie Avena, MD  Melatonin 10 MG TABS Take 10 mg by mouth at bedtime.    [provider]  metoprolol succinate (TOPROL-XL) 50 MG 24 hr tablet Take 1 tablet (50 mg total) by mouth daily. 06/11/19   Wendall Stade, MD  montelukast (SINGULAIR) 10 MG tablet TAKE ONE TABLET BY MOUTH AT BEDTIME Patient taking differently: Take 10 mg by mouth at bedtime. 12/13/19   Hetty Blend, FNP  Multiple Vitamins-Minerals (MULTIVITAMIN WITH MINERALS) tablet Take 1 tablet by mouth daily.    [provider]  omeprazole (PRILOSEC) 40 MG capsule TAKE 1 CAPSULE(40 MG) BY MOUTH DAILY Patient taking differently: Take 40 mg by mouth daily. 01/08/20   Hetty Blend, FNP  oxyCODONE  (ROXICODONE) 15 MG immediate release tablet Take 15 mg by mouth as needed for pain. 01/06/21   [provider]  polyethylene glycol (MIRALAX / GLYCOLAX) 17 g packet Take 17 g by mouth daily as needed for mild constipation or moderate constipation. 08/10/20   Pokhrel, Rebekah Chesterfield, MD  QUEtiapine (SEROQUEL) 100 MG tablet Take 100 mg by mouth at bedtime. 03/23/19   [provider]  rosuvastatin (CRESTOR) 10 MG tablet Take 10 mg by mouth daily.    [provider]  sertraline (ZOLOFT) 50 MG tablet Take 75 mg by mouth daily.    [provider]  spironolactone (ALDACTONE) 25 MG tablet Take 25 mg by mouth daily as needed (leg swelling). 11/14/19   [provider]  thiamine 100 MG tablet Take 100 mg by mouth daily.    [provider]    Allergies    Acetaminophen, Nsaids, Peanuts [peanut oil], and Triamterene-hctz  Review of Systems   Review of Systems  Constitutional:  Positive for fatigue. Negative for appetite change and unexpected weight change.  Respiratory:  Negative for chest tightness and shortness of breath.   Cardiovascular:  Negative for chest pain.  Gastrointestinal:  Positive for diarrhea (Unchanged from baseline), nausea (Preceded seizure) and vomiting (Preceded seizure). Negative for abdominal distention and abdominal pain.  Genitourinary:  Negative for dysuria.  Musculoskeletal:  Positive for gait problem ("I am clumsy "). Negative for neck pain and neck stiffness.  Skin:  Positive for wound (Superficial abrasions from incident where patient tripped a few days ago).  Neurological:  Positive for seizures, numbness and headaches.  Psychiatric/Behavioral:  The patient is nervous/anxious.    Physical Exam Updated Vital Signs BP (!) 137/95   Pulse 93   Temp 98.1 F (36.7 C) (Oral)   Resp 15   LMP 12/29/2020 (Within Days)   SpO2 100%   Constitutional: Patient is sitting in bed comfortably.  No acute distress noted. HENT: Head is  atraumatic without bruising or abrasions.  Tender to palpation over left temporal area. Neck: Full active range of motion without pain.  No midline cervical spine tenderness. Cardio: Regular rate and rhythm.  No murmurs, rubs, gallops Pulm: Clear to auscultation bilaterally.  Normal work of breathing on room air. Abdomen: Soft, nontender, nondistended. MSK: Normal bulk and tone.  Superficial abrasion over left upper thorax from fall during seizure today. Skin: Skin is warm and dry with multiple abrasions, patient noted to be from fall earlier in the week. Neuro: Mental Status: Patient is awake, alert, oriented x4 No signs of aphasia or neglect Cranial Nerves: II: Pupils equal, round, and reactive to light.   III,IV, VI: EOMI without ptosis or diploplia.  V: Facial sensation is symmetric to light touch and  Temperature. VII: Facial movement is symmetric.  VIII: hearing is intact to voice X: Uvula elevates symmetrically XI: Shoulder shrug is symmetric. XII: tongue is midline without atrophy or fasciculations.  Motor: Equal effort thorughout, at Least 5/5 bilateral UE, 5/5 bilateral lower extremitiy  Sensory: Sensation is grossly intact and equal bilateral UEs & LEs Cerebellar: Finger-Nose intact bilaterally, heel-to-shin assessment limited by knee brace and patient pain Psych: Patient appears anxious.  ED Results / Procedures / Treatments   Labs (all labs ordered are listed, but only abnormal results are displayed) Labs Reviewed  CBC - Abnormal; Notable for the following components:      Result Value   RBC 3.52 (*)    Hemoglobin 11.2 (*)    HCT 33.3 (*)    All other components within normal limits  COMPREHENSIVE METABOLIC PANEL - Abnormal; Notable for the following components:   Potassium 3.0 (*)    Glucose, Bld 117 (*)    Albumin 3.4 (*)    Alkaline Phosphatase 203 (*)    Total Bilirubin 1.4 (*)    All other components within normal limits  CK - Abnormal; Notable for the  following components:   Total CK 1,102 (*)    All other components within normal limits  RAPID URINE DRUG SCREEN, HOSP PERFORMED  MAGNESIUM  I-STAT BETA HCG BLOOD, ED ([facility], [facility], [facility] ONLY)    EKG EKG Interpretation  Date/Time:  Tuesday January 20 2021 16:13:32 EDT Ventricular Rate:  84 PR Interval:  189 QRS Duration: 108 QT Interval:  483 QTC Calculation: 571 R Axis:   62 Text Interpretation: Sinus rhythm Low voltage, precordial leads Prolonged QT interval When compared to prior, more prolonged QTC. No STEMI Confirmed by Theda Belfast ([id number]) on 01/20/2021 4:17:48 PM  Radiology DG Chest 2 View  Result Date: 01/20/2021 CLINICAL DATA:  Fall. EXAM: CHEST - 2 VIEW COMPARISON:  Chest x-ray 11/15/2016. FINDINGS: The heart size and mediastinal contours are within normal limits. Both lungs are clear. The visualized skeletal structures are unremarkable. IMPRESSION: No active cardiopulmonary disease. Electronically Signed   By: Darliss Cheney M.D.   On: 01/20/2021 19:39   CT Head Wo Contrast  Result Date: 01/20/2021 CLINICAL DATA:  Seizure, nontraumatic (Age 95-40y); Neck trauma, uncomplicated (NEXUS/CCR neg) (Age 104-64y). EXAM: CT HEAD WITHOUT CONTRAST CT CERVICAL SPINE WITHOUT CONTRAST TECHNIQUE: Multidetector CT imaging of the head and cervical spine was performed following the standard protocol without intravenous contrast. Multiplanar CT image reconstructions of the cervical spine were also generated. COMPARISON:  Head CT 11/17/2020.  Cervical spine MRI 08/14/2014. FINDINGS: CT HEAD FINDINGS Brain: There is no evidence of an acute infarct, mass, midline shift, or extra-axial fluid collection. There is a 3 mm mildly hyperdense focus at the medial aspect of a deep sulcus in the right parietal region, indeterminate for trace subarachnoid hemorrhage or artifact/image noise. No intracranial hemorrhage is identified elsewhere. The ventricles are normal in size. Vascular: No hyperdense vessel. Skull: No  fracture or suspicious osseous lesion. Sinuses/Orbits: Visualized paranasal sinuses and mastoid air cells are clear. Unremarkable orbits. Other: Mild bilateral scalp soft tissue swelling. CT CERVICAL SPINE FINDINGS Alignment: Reversal of the normal cervical lordosis.  No listhesis. Skull base and vertebrae: No acute fracture or suspicious osseous lesion. Soft tissues and spinal canal: No prevertebral fluid or swelling. No visible canal hematoma. Disc levels:  Mild cervical spondylosis. Upper chest: Clear lung apices. Other: Moderately large region increased density in the subcutaneous fat of the left lower neck and upper back consistent with contusion. IMPRESSION: 1. Trace subarachnoid hemorrhage versus artifact in a deep right parietal sulcus without other evidence of acute intracranial injury. Correlate with pending MRI. 2. Mild scalp swelling. 3. Left lower neck and upper back contusion. 4. No acute cervical spine fracture or subluxation. Electronically Signed   By: Sebastian Ache M.D.   On: 01/20/2021 18:38   CT Cervical Spine Wo Contrast  Result Date: 01/20/2021 CLINICAL DATA:  Seizure, nontraumatic (Age 95-40y); Neck trauma, uncomplicated (NEXUS/CCR neg) (Age 85-64y). EXAM: CT HEAD WITHOUT CONTRAST CT CERVICAL SPINE WITHOUT CONTRAST TECHNIQUE: Multidetector CT imaging of the head and cervical spine was performed following the standard protocol without intravenous contrast. Multiplanar CT image reconstructions of the cervical spine were also generated. COMPARISON:  Head CT 11/17/2020.  Cervical spine MRI 08/14/2014. FINDINGS: CT HEAD FINDINGS Brain: There is no evidence of an acute infarct, mass, midline shift, or extra-axial fluid collection. There is a 3 mm mildly hyperdense focus at the medial aspect of a deep sulcus in the right parietal region, indeterminate for trace subarachnoid hemorrhage or artifact/image noise. No intracranial hemorrhage is identified elsewhere. The ventricles are normal in size.  Vascular:  No hyperdense vessel. Skull: No fracture or suspicious osseous lesion. Sinuses/Orbits: Visualized paranasal sinuses and mastoid air cells are clear. Unremarkable orbits. Other: Mild bilateral scalp soft tissue swelling. CT CERVICAL SPINE FINDINGS Alignment: Reversal of the normal cervical lordosis.  No listhesis. Skull base and vertebrae: No acute fracture or suspicious osseous lesion. Soft tissues and spinal canal: No prevertebral fluid or swelling. No visible canal hematoma. Disc levels:  Mild cervical spondylosis. Upper chest: Clear lung apices. Other: Moderately large region increased density in the subcutaneous fat of the left lower neck and upper back consistent with contusion. IMPRESSION: 1. Trace subarachnoid hemorrhage versus artifact in a deep right parietal sulcus without other evidence of acute intracranial injury. Correlate with pending MRI. 2. Mild scalp swelling. 3. Left lower neck and upper back contusion. 4. No acute cervical spine fracture or subluxation. Electronically Signed   By: Sebastian Ache M.D.   On: 01/20/2021 18:38   MR Brain W and Wo Contrast  Result Date: 01/20/2021 CLINICAL DATA:  Initial evaluation for acute seizure, fall, possible subarachnoid hemorrhage on prior CT. EXAM: MRI HEAD WITHOUT AND WITH CONTRAST TECHNIQUE: Multiplanar, multiecho pulse sequences of the brain and surrounding structures were obtained without and with intravenous contrast. CONTRAST:  9mL GADAVIST GADOBUTROL 1 MMOL/ML IV SOLN COMPARISON:  Prior CT from earlier the same day. FINDINGS: Brain: Cerebral volume within normal limits for age. No focal parenchymal signal abnormality. No abnormal foci of restricted diffusion to suggest acute or subacute ischemia or changes related to seizure. Gray-white matter differentiation maintained. No visible acute intracranial hemorrhage. Specifically, no convincing signal changes seen at the right parietal region to correspond with previously question subarachnoid  hemorrhage on prior CT. This is likely consistent with artifact. No mass lesion, midline shift or mass effect. No hydrocephalus or extra-axial fluid collection. Pituitary gland suprasellar region normal. Midline structures intact. No intrinsic temporal lobe abnormality. No abnormal enhancement. Vascular: Major intracranial vascular flow voids are maintained. Skull and upper cervical spine: Craniocervical junction normal. Bone marrow signal intensity within normal limits. Mild scalp swelling again noted. Sinuses/Orbits: Globes and orbital soft tissues demonstrate no acute finding. Paranasal sinuses are largely clear. No mastoid effusion. Inner ear structures within normal limits. Other: None. IMPRESSION: 1. No acute intracranial abnormality identified. Specifically, no convincing signal changes seen at the right parietal region to correspond with previously question subarachnoid hemorrhage on prior CT. This was likely consistent with artifact. 2. Otherwise normal brain MRI. Electronically Signed   By: Rise Mu M.D.   On: 01/20/2021 21:33   DG Shoulder Left  Result Date: 01/20/2021 CLINICAL DATA:  Fall. EXAM: LEFT SHOULDER - 2+ VIEW COMPARISON:  None. FINDINGS: There is no evidence of fracture or dislocation. There is no evidence of arthropathy or other focal bone abnormality. Soft tissues are unremarkable. IMPRESSION: Negative. Electronically Signed   By: Darliss Cheney M.D.   On: 01/20/2021 19:39    Procedures None  Medications Ordered in ED Medications  gadobutrol (GADAVIST) 1 MMOL/ML injection 9 mL (9 mLs Intravenous Contrast Given 01/20/21 2032)  potassium chloride SA (KLOR-CON) CR tablet 40 mEq (40 mEq Oral Given 01/20/21 2103)    ED Course  I have reviewed the triage vital signs and the nursing notes.  Pertinent labs & imaging results that were available during my care of the patient were reviewed by me and considered in my medical decision making (see chart for details).    MDM  Rules/Calculators/A&P  [name] presented after a witnessed seizure at her neurologist office where she was being evaluated regarding a first-time seizure around 1 month ago.  During this episode, the patient did strike her head.  On evaluation, the patient is alert and oriented x4 without focal deficits and only complaining of feeling fatigued.  The team spoke with neurology who recommended obtaining a noncontrast CT of the head as well as MRI brain with and without contrast.  They also recommended obtaining CK levels.  CT head was concerning for trace subarachnoid hemorrhage versus artifact in a deep right parietal sulcus without other evidence of acute intracranial injury, mild scalp swelling, left lower neck and upper back contusion, no acute cervical spine fracture or subluxation.  MRI follow-up showed no acute intracranial abnormality, specifically in the region of concern on CT head.  CK level did come back elevated at 1,102.  At this time, neurology was reconsulted with recommendations of admission for EEG monitoring.  Hospitalist team has been consulted for admission.  Final Clinical Impression(s) / ED Diagnoses Final diagnoses:  None    Rx / DC Orders ED Discharge Orders     None        Champ Mungo, DO 01/20/21 2225    Tegeler, Canary Brim, MD 01/21/21 0020

## 2021-01-20 NOTE — H&P (Addendum)
History and Physical    Arina Torry GGY:[id number] DOB: [birth year] DOA: 01/20/2021  PCP: [name], MD  Patient coming from: Neurologist office via EMS  I have personally briefly reviewed patient's old medical records in [facility]  Chief Complaint: Seizure  HPI: Kara [name] is a 35 y.o. female with medical history significant for asthma, HTN, HLD, chronic pancreatitis with associated chronic pain syndrome, depression, alcohol use disorder in remission since 2017, tobacco use who presented to the ED from her neurologist's office for evaluation of seizure with fall.  Patient initially seen in the ED 12/07/2020 for a brief "staring spell" without jerking movement 2 days prior.  The night before she exhibited generalized tonic-clonic jerking unresponsiveness followed by postictal state of mild confusion.  She was noted to have potassium 3.3 and magnesium 1.2.  Electrolytes were replenished.  She was well-appearing and at her baseline and she was referred to neurology for outpatient follow-up.  Patient was seen by neurology earlier today (01/20/2021) by Dr. Epimenio Foot in office.  During examination of gait, patient developed seizure-like activity as described below per Dr. Bonnita Hollow documentation:  "While being examined, she had a seizure witnessed by me.  She had just stood up for me to assess her gait.  She took a few steps.  She then became rigid and fell backwards against the wall.  She did fall to the floor with her head hitting (slid from the wall but had did hit from about a foot up).   She then had 30 seconds of bilateral symmetric clonic activity.  She then became still and was unresponsive for about 1 minute and then was responsive to localize sound.  She began to speak about 3 to 4 minutes later.  About 10 minutes after the seizure she was able to answer detailed questions.    She does not have memory of the event.  Afterwards, she complained of a sore throat.  She did not bite her  tongue.  There was no incontinence.   She did vomit x1 about 10 minutes after the event.  She denied headache or other issue.  She did not have any numbness or weakness."  Patient was sent to the ED via EMS for further evaluation.  Patient states that she has been feeling well without any issues since her initial ED visit until her seizure episode today.  She denies any other previous seizures.  She does not recall the event today but is currently back at her baseline, alert and oriented x 4, and communicating well.  She is complaining of some cramping sensation in her upper legs as well as pain in her left upper back where she developed a contusion from her fall.  She reports chronic loose stools since her prior cholecystectomy but no increased frequency.  She denies any chest pain, dyspnea, abdominal pain, dysuria.  She denies any changes in her medications other than the addition of oral magnesium on her last ED visit.  She is a current smoker of 5 cigarettes/day.  She denies any recent alcohol use.  She denies any illicit drug use.  ED Course:  Initial vitals showed BP 121/78, pulse 85, RR 22, temp 97.9 F, SPO2 98% on room air.  Labs show CK 1102, sodium 136, potassium 3.0, bicarb 25, BUN 6, creatinine 0.74, serum glucose 117, AST 38, ALT 15, alkaline phosphatase 203, total bilirubin 1.4, WBC 7.6, hemoglobin 11.2, platelets 156,000, i-STAT beta-hCG <5.0.  CT head without contrast showed changes questioning trace subarachnoid  hemorrhage versus artifact in the deep right parietal sulcus without evidence of acute intracranial injury.  Mild scalp swelling noted.  CT cervical spine without contrast showed left lower neck and upper back contusion without acute cervical spine fracture or subluxation.  Follow-up MRI brain with and without contrast was negative for acute intracranial abnormality.  No convincing changes to suggest subarachnoid hemorrhage, prior CT changes were felt consistent with  artifact.  Otherwise normal brain MRI.  He  2 view chest x-ray negative for focal consolidation, edema, effusion.  Left shoulder x-ray is negative for evidence of fracture or dislocation.  Patient was given oral K 40 mEq.  EDP consulted neurology who recommended medical admission for further seizure evaluation and management and they will follow.  The hospitalist service was consulted to admit for further evaluation and management.  Review of Systems: All systems reviewed and are negative except as documented in history of present illness above.   Past Medical History:  Diagnosis Date   Alcoholism in remission (HCC)    per pt in remission since 2017   Anemia    AR (allergic rhinitis)    allergist-- dr Lucie Leather   Biliary dyskinesia    Diastolic CHF (HCC)    followd by dr Demetrius Charity. [facility] Emms   Environmental allergies    GERD (gastroesophageal reflux disease)    Headache    History of acute pancreatitis    alcoholic pancreatitis ,  2016 and 2017   History of sepsis 06/12/2016   with CAP   History of syncope    History of thrombosis 01/29/2015   splenic vein thrombosis-- treated with coumadin--- readmitted for abd. hematoma due to coumadin treated with IR embolization of the IMA branches   Hypertension    Polyneuropathy    Prolonged QT syndrome    cardiologist-- dr Eden Emms (notes in epic)   Severe persistent asthma    pulmonologist-- dr dr Belva Crome (notes in epic)    Past Surgical History:  Procedure Laterality Date   CHOLECYSTECTOMY N/A 06/21/2018   Procedure: LAPAROSCOPIC CHOLECYSTECTOMY;  Surgeon: Abigail Miyamoto, MD;  Location: [facility];  Service: General;  Laterality: N/A;   ESOPHAGOGASTRODUODENOSCOPY N/A 07/17/2014   Procedure: ESOPHAGOGASTRODUODENOSCOPY (EGD);  Surgeon: Dorena Cookey, MD;  Location: [facility];  Service: Endoscopy;  Laterality: N/A;   TYMPANOSTOMY TUBE PLACEMENT Bilateral 11 MONTHS OLD   VIDEO BRONCHOSCOPY Bilateral 10/08/2016   Procedure: VIDEO BRONCHOSCOPY WITH  FLUORO;  Surgeon: Chilton Greathouse, MD;  Location: [facility];  Service: Cardiopulmonary;  Laterality: Bilateral;    Social History:  reports that she has been smoking cigarettes. She has a 1.25 pack-year smoking history. She has never used smokeless tobacco. She reports that she does not currently use alcohol. She reports that she does not currently use drugs.  Allergies  Allergen Reactions   Acetaminophen Other (See Comments)    Gi Upset   Nsaids Other (See Comments)    Upset stomach    Peanuts [Peanut Oil] Hives and Itching    "ONLY TREE NUTS"   Triamterene-Hctz Nausea Only    dizzy    Family History  Problem Relation Age of Onset   Heart failure Mother    Colon polyps Mother    Heart failure Father    Diabetes Father    Heart failure Maternal Grandfather    Colon cancer Neg Hx      Prior to Admission medications   Medication Sig Start Date End Date Taking? Authorizing Provider  albuterol (PROVENTIL) (2.5 MG/3ML) 0.083% nebulizer solution Take  3 mLs (2.5 mg total) by nebulization every 4 (four) hours as needed for wheezing or shortness of breath. 05/03/19   Hetty Blend, FNP  albuterol (VENTOLIN HFA) 108 (90 Base) MCG/ACT inhaler 2 puffs every 4 hours as needed for coughing or wheezing. Patient taking differently: Inhale 2 puffs into the lungs every 6 (six) hours as needed for wheezing or shortness of breath. 2 puffs every 4 hours as needed for coughing or wheezing. 06/04/19   Hetty Blend, FNP  BREO ELLIPTA 200-25 MCG/INH AEPB INHALE 1 PUFF INTO THE LUNGS DAILY 05/28/19   Lucie Leather, Alvira Philips, MD  buPROPion [facility] SR) 150 MG 12 hr tablet Take 150-300 mg by mouth See admin instructions. Takes 1 tablet in the morning and 2 tablets at bedtime 04/10/19   [provider]  chlorhexidine (PERIDEX) 0.12 % solution Use as directed 15 mLs in the mouth or throat 2 (two) times daily. After meals Patient taking differently: Use as directed 15 mLs in the mouth or throat daily as  needed (dental plaque). After meals 05/03/19   Ambs, Norvel Richards, FNP  Cholecalciferol (VITAMIN D PO) Take 1,000 Units by mouth daily.    [provider]  DUPIXENT 300 MG/2ML prefilled syringe INJECT 300 MG SUBCUTANEOUSLY EVERY OTHER WEEK Patient taking differently: Inject 300 mg into the skin every 14 (fourteen) days. 06/02/20   Kozlow, Alvira Philips, MD  EPINEPHrine (EPIPEN 2-PAK) 0.3 mg/0.3 mL IJ SOAJ injection Inject 0.3 mLs (0.3 mg total) into the muscle once as needed (for severe allergic reaction). 07/11/18   Kozlow, Alvira Philips, MD  gabapentin (NEURONTIN) 300 MG capsule TAKE 3 CAPSULES BY MOUTH TWICE DAILY Patient taking differently: Take 900 mg by mouth 2 (two) times daily. 12/27/18   Sater, Pearletha Furl, MD  lipase/protease/amylase (CREON) 36000 UNITS CPEP capsule Take 72,000 Units by mouth 3 (three) times daily with meals. 04/28/20   [provider]  magnesium oxide (MAGOX 400) 400 (240 Mg) MG tablet Take 1 tablet (400 mg total) by mouth daily. 12/07/20   Ernie Avena, MD  Melatonin 10 MG TABS Take 10 mg by mouth at bedtime.    [provider]  metoprolol succinate (TOPROL-XL) 50 MG 24 hr tablet Take 1 tablet (50 mg total) by mouth daily. 06/11/19   Wendall Stade, MD  montelukast (SINGULAIR) 10 MG tablet TAKE ONE TABLET BY MOUTH AT BEDTIME Patient taking differently: Take 10 mg by mouth at bedtime. 12/13/19   Hetty Blend, FNP  Multiple Vitamins-Minerals (MULTIVITAMIN WITH MINERALS) tablet Take 1 tablet by mouth daily.    [provider]  omeprazole (PRILOSEC) 40 MG capsule TAKE 1 CAPSULE(40 MG) BY MOUTH DAILY Patient taking differently: Take 40 mg by mouth daily. 01/08/20   Hetty Blend, FNP  oxyCODONE (ROXICODONE) 15 MG immediate release tablet Take 15 mg by mouth as needed for pain. 01/06/21   [provider]  polyethylene glycol (MIRALAX / GLYCOLAX) 17 g packet Take 17 g by mouth daily as needed for mild constipation or moderate constipation. 08/10/20   Pokhrel,  Rebekah Chesterfield, MD  QUEtiapine (SEROQUEL) 100 MG tablet Take 100 mg by mouth at bedtime. 03/23/19   [provider]  rosuvastatin (CRESTOR) 10 MG tablet Take 10 mg by mouth daily.    [provider]  sertraline (ZOLOFT) 50 MG tablet Take 75 mg by mouth daily.    [provider]  spironolactone (ALDACTONE) 25 MG tablet Take 25 mg by mouth daily as needed (leg swelling). 11/14/19  [provider]  thiamine 100 MG tablet Take 100 mg by mouth daily.    [provider]    Physical Exam: Vitals:   01/20/21 1920 01/20/21 1922 01/20/21 1930 01/20/21 2103  BP: (!) 132/99  (!) 129/92 (!) 137/95  Pulse: 88 92 89 93  Resp: (!) 26 (!) 26 19 15   Temp: 98.1 F (36.7 C)     TempSrc: Oral     SpO2: 96% 98% 97% 100%   Constitutional: Sitting up in bed, NAD, calm, comfortable Eyes: PERRL, lids and conjunctivae normal ENMT: Mucous membranes are moist. Posterior pharynx clear of any exudate or lesions, no wound to tongue or inside cheek. Normal dentition.  Neck: normal, supple, no masses. Respiratory: clear to auscultation bilaterally, no wheezing, no crackles. Normal respiratory effort. No accessory muscle use.  Cardiovascular: Regular rate and rhythm, no murmurs / rubs / gallops. No extremity edema. 2+ pedal pulses. Abdomen: no tenderness, no masses palpated. No hepatosplenomegaly. Bowel sounds positive.  Musculoskeletal: no clubbing / cyanosis. No joint deformity upper and lower extremities. Good ROM, no contractures. Normal muscle tone.  Skin: Contusion with abrasion left upper back secondary to fall in neurology clinic, abrasion with linear scratch marks over the dorsal aspect of the right foot (from pet dogs, per patient) Neurologic: CN 2-12 grossly intact. Sensation intact. Strength 5/5 in all 4.  Psychiatric: Normal judgment and insight. Alert and oriented x 3. Normal mood.   Labs on Admission: I have personally reviewed following labs and imaging  studies  CBC: Recent Labs  Lab 01/20/21 1819  WBC 7.6  HGB 11.2*  HCT 33.3*  MCV 94.6  PLT 156   Basic Metabolic Panel: Recent Labs  Lab 01/20/21 1819  NA 136  K 3.0*  CL 100  CO2 25  GLUCOSE 117*  BUN 6  CREATININE 0.74  CALCIUM 8.9   GFR: Estimated Creatinine Clearance: 108 mL/min (by C-G formula based on SCr of 0.74 mg/dL). Liver Function Tests: Recent Labs  Lab 01/20/21 1819  AST 38  ALT 15  ALKPHOS 203*  BILITOT 1.4*  PROT 6.6  ALBUMIN 3.4*   No results for input(s): LIPASE, AMYLASE in the last 168 hours. No results for input(s): AMMONIA in the last 168 hours. Coagulation Profile: No results for input(s): INR, PROTIME in the last 168 hours. Cardiac Enzymes: Recent Labs  Lab 01/20/21 1728  CKTOTAL 1,102*   BNP (last 3 results) No results for input(s): PROBNP in the last 8760 hours. HbA1C: No results for input(s): HGBA1C in the last 72 hours. CBG: No results for input(s): GLUCAP in the last 168 hours. Lipid Profile: No results for input(s): CHOL, HDL, LDLCALC, TRIG, CHOLHDL, LDLDIRECT in the last 72 hours. Thyroid Function Tests: No results for input(s): TSH, T4TOTAL, FREET4, T3FREE, THYROIDAB in the last 72 hours. Anemia Panel: No results for input(s): VITAMINB12, FOLATE, FERRITIN, TIBC, IRON, RETICCTPCT in the last 72 hours. Urine analysis:    Component Value Date/Time   COLORURINE AMBER (A) 12/07/2020 1122   APPEARANCEUR HAZY (A) 12/07/2020 1122   LABSPEC 1.023 12/07/2020 1122   PHURINE 7.0 12/07/2020 1122   GLUCOSEU NEGATIVE 12/07/2020 1122   HGBUR MODERATE (A) 12/07/2020 1122   BILIRUBINUR NEGATIVE 12/07/2020 1122   KETONESUR 5 (A) 12/07/2020 1122   PROTEINUR 100 (A) 12/07/2020 1122   UROBILINOGEN 1.0 02/16/2015 1231   NITRITE NEGATIVE 12/07/2020 1122   LEUKOCYTESUR NEGATIVE 12/07/2020 1122    Radiological Exams on Admission: DG Chest 2 View  Result Date: 01/20/2021 CLINICAL  DATA:  Fall. EXAM: CHEST - 2 VIEW COMPARISON:  Chest  x-ray 11/15/2016. FINDINGS: The heart size and mediastinal contours are within normal limits. Both lungs are clear. The visualized skeletal structures are unremarkable. IMPRESSION: No active cardiopulmonary disease. Electronically Signed   By: Darliss Cheney M.D.   On: 01/20/2021 19:39   CT Head Wo Contrast  Result Date: 01/20/2021 CLINICAL DATA:  Seizure, nontraumatic (Age 75-40y); Neck trauma, uncomplicated (NEXUS/CCR neg) (Age 37-64y). EXAM: CT HEAD WITHOUT CONTRAST CT CERVICAL SPINE WITHOUT CONTRAST TECHNIQUE: Multidetector CT imaging of the head and cervical spine was performed following the standard protocol without intravenous contrast. Multiplanar CT image reconstructions of the cervical spine were also generated. COMPARISON:  Head CT 11/17/2020.  Cervical spine MRI 08/14/2014. FINDINGS: CT HEAD FINDINGS Brain: There is no evidence of an acute infarct, mass, midline shift, or extra-axial fluid collection. There is a 3 mm mildly hyperdense focus at the medial aspect of a deep sulcus in the right parietal region, indeterminate for trace subarachnoid hemorrhage or artifact/image noise. No intracranial hemorrhage is identified elsewhere. The ventricles are normal in size. Vascular: No hyperdense vessel. Skull: No fracture or suspicious osseous lesion. Sinuses/Orbits: Visualized paranasal sinuses and mastoid air cells are clear. Unremarkable orbits. Other: Mild bilateral scalp soft tissue swelling. CT CERVICAL SPINE FINDINGS Alignment: Reversal of the normal cervical lordosis.  No listhesis. Skull base and vertebrae: No acute fracture or suspicious osseous lesion. Soft tissues and spinal canal: No prevertebral fluid or swelling. No visible canal hematoma. Disc levels:  Mild cervical spondylosis. Upper chest: Clear lung apices. Other: Moderately large region increased density in the subcutaneous fat of the left lower neck and upper back consistent with contusion. IMPRESSION: 1. Trace subarachnoid hemorrhage  versus artifact in a deep right parietal sulcus without other evidence of acute intracranial injury. Correlate with pending MRI. 2. Mild scalp swelling. 3. Left lower neck and upper back contusion. 4. No acute cervical spine fracture or subluxation. Electronically Signed   By: Sebastian Ache M.D.   On: 01/20/2021 18:38   CT Cervical Spine Wo Contrast  Result Date: 01/20/2021 CLINICAL DATA:  Seizure, nontraumatic (Age 75-40y); Neck trauma, uncomplicated (NEXUS/CCR neg) (Age 65-64y). EXAM: CT HEAD WITHOUT CONTRAST CT CERVICAL SPINE WITHOUT CONTRAST TECHNIQUE: Multidetector CT imaging of the head and cervical spine was performed following the standard protocol without intravenous contrast. Multiplanar CT image reconstructions of the cervical spine were also generated. COMPARISON:  Head CT 11/17/2020.  Cervical spine MRI 08/14/2014. FINDINGS: CT HEAD FINDINGS Brain: There is no evidence of an acute infarct, mass, midline shift, or extra-axial fluid collection. There is a 3 mm mildly hyperdense focus at the medial aspect of a deep sulcus in the right parietal region, indeterminate for trace subarachnoid hemorrhage or artifact/image noise. No intracranial hemorrhage is identified elsewhere. The ventricles are normal in size. Vascular: No hyperdense vessel. Skull: No fracture or suspicious osseous lesion. Sinuses/Orbits: Visualized paranasal sinuses and mastoid air cells are clear. Unremarkable orbits. Other: Mild bilateral scalp soft tissue swelling. CT CERVICAL SPINE FINDINGS Alignment: Reversal of the normal cervical lordosis.  No listhesis. Skull base and vertebrae: No acute fracture or suspicious osseous lesion. Soft tissues and spinal canal: No prevertebral fluid or swelling. No visible canal hematoma. Disc levels:  Mild cervical spondylosis. Upper chest: Clear lung apices. Other: Moderately large region increased density in the subcutaneous fat of the left lower neck and upper back consistent with contusion.  IMPRESSION: 1. Trace subarachnoid hemorrhage versus artifact in a deep right parietal sulcus  without other evidence of acute intracranial injury. Correlate with pending MRI. 2. Mild scalp swelling. 3. Left lower neck and upper back contusion. 4. No acute cervical spine fracture or subluxation. Electronically Signed   By: Sebastian Ache M.D.   On: 01/20/2021 18:38   MR Brain W and Wo Contrast  Result Date: 01/20/2021 CLINICAL DATA:  Initial evaluation for acute seizure, fall, possible subarachnoid hemorrhage on prior CT. EXAM: MRI HEAD WITHOUT AND WITH CONTRAST TECHNIQUE: Multiplanar, multiecho pulse sequences of the brain and surrounding structures were obtained without and with intravenous contrast. CONTRAST:  9mL GADAVIST GADOBUTROL 1 MMOL/ML IV SOLN COMPARISON:  Prior CT from earlier the same day. FINDINGS: Brain: Cerebral volume within normal limits for age. No focal parenchymal signal abnormality. No abnormal foci of restricted diffusion to suggest acute or subacute ischemia or changes related to seizure. Gray-white matter differentiation maintained. No visible acute intracranial hemorrhage. Specifically, no convincing signal changes seen at the right parietal region to correspond with previously question subarachnoid hemorrhage on prior CT. This is likely consistent with artifact. No mass lesion, midline shift or mass effect. No hydrocephalus or extra-axial fluid collection. Pituitary gland suprasellar region normal. Midline structures intact. No intrinsic temporal lobe abnormality. No abnormal enhancement. Vascular: Major intracranial vascular flow voids are maintained. Skull and upper cervical spine: Craniocervical junction normal. Bone marrow signal intensity within normal limits. Mild scalp swelling again noted. Sinuses/Orbits: Globes and orbital soft tissues demonstrate no acute finding. Paranasal sinuses are largely clear. No mastoid effusion. Inner ear structures within normal limits. Other: None.  IMPRESSION: 1. No acute intracranial abnormality identified. Specifically, no convincing signal changes seen at the right parietal region to correspond with previously question subarachnoid hemorrhage on prior CT. This was likely consistent with artifact. 2. Otherwise normal brain MRI. Electronically Signed   By: Rise Mu M.D.   On: 01/20/2021 21:33   DG Shoulder Left  Result Date: 01/20/2021 CLINICAL DATA:  Fall. EXAM: LEFT SHOULDER - 2+ VIEW COMPARISON:  None. FINDINGS: There is no evidence of fracture or dislocation. There is no evidence of arthropathy or other focal bone abnormality. Soft tissues are unremarkable. IMPRESSION: Negative. Electronically Signed   By: Darliss Cheney M.D.   On: 01/20/2021 19:39    EKG: Personally reviewed. Normal sinus rhythm without acute ischemic changes, QTC 571.  QTC is more prolonged when compared to prior.  Assessment/Plan Principal Problem:   Generalized tonic-clonic seizure (HCC) Active Problems:   Hypokalemia   Hypertension   Chronic pancreatitis (HCC)   Asthma, severe persistent, well-controlled   Elevated CK   Depression   Jozy Mcphearson is a 35 y.o. female with medical history significant for asthma, HTN, HLD, chronic pancreatitis with associated chronic pain syndrome, depression, alcohol use disorder in remission since 2017, tobacco use who is admitted for evaluation of seizure.  Generalized tonic-clonic seizure: Witnessed seizure while in neurology office, second event in the last 8 weeks.  No history of seizure prior.  MRI brain obtained and is negative for acute intracranial abnormality.  Patient is currently back to baseline at time of admission. -Neurology consulted and will see -Obtain EEG -Continue seizure precautions -IV Ativan if needed for recurrent seizure -Hold home sertraline, bupropion, Seroquel due to potential seizure lowering threshold -Continue home gabapentin  Elevated CK: Mild, secondary to seizure activity.   Started on fluids with IV NS overnight and repeat CK level in AM.  Hypokalemia: Given oral supplement in the ED.  Add potassium to IV fluids.  Check magnesium and  replete if needed.  Prolonged QTc: Chronic issue, QTC more prolonged on admission compared to prior.  Replete potassium, check magnesium.  Holding sertraline and Seroquel as above.  Try to limit QTc prolonging agents.  Asthma: Stable without any wheezing.  Continue Breo Ellipta, Singulair, and albuterol as needed.  Chronic pancreatitis with associated chronic pain syndrome: Currently stable.  Continue Creon, gabapentin, Oxy IR as needed.  Hypertension: Continue Toprol-XL.  Depression/Insomnia: Holding sertraline, bupropion, Seroquel as above.  History of alcohol use disorder: In remission since 2017.  Tobacco use: Reports smoking 5 cigarettes daily.  Smoking cessation advised.  DVT prophylaxis: Lovenox Code Status: Full code, confirmed with patient on admission Family Communication: Discussed with patient, she has discussed with family Disposition Plan: From home and likely discharge to home pending clinical progress Consults called: Neurology Level of care: Telemetry Medical Admission status:  Status is: Observation  The patient remains OBS appropriate and will d/c before 2 midnights.  Dispo: The patient is from: Home              Anticipated d/c is to: Home              Patient currently is not medically stable to d/c.   Difficult to place patient No   Darreld Mclean MD [facility]  If 7PM-7AM, please contact night-coverage [email or web address]  01/20/2021, 10:15 PM

## 2021-01-20 NOTE — ED Notes (Signed)
Seizure pads on. Suction prepped.

## 2021-01-21 ENCOUNTER — Observation Stay (HOSPITAL_COMMUNITY): Payer: BC Managed Care – PPO

## 2021-01-21 DIAGNOSIS — Z9049 Acquired absence of other specified parts of digestive tract: Secondary | ICD-10-CM | POA: Diagnosis not present

## 2021-01-21 DIAGNOSIS — R748 Abnormal levels of other serum enzymes: Secondary | ICD-10-CM | POA: Diagnosis not present

## 2021-01-21 DIAGNOSIS — I110 Hypertensive heart disease with heart failure: Secondary | ICD-10-CM | POA: Diagnosis present

## 2021-01-21 DIAGNOSIS — G4700 Insomnia, unspecified: Secondary | ICD-10-CM | POA: Diagnosis present

## 2021-01-21 DIAGNOSIS — Z20822 Contact with and (suspected) exposure to covid-19: Secondary | ICD-10-CM | POA: Diagnosis present

## 2021-01-21 DIAGNOSIS — Z86718 Personal history of other venous thrombosis and embolism: Secondary | ICD-10-CM | POA: Diagnosis not present

## 2021-01-21 DIAGNOSIS — J4550 Severe persistent asthma, uncomplicated: Secondary | ICD-10-CM | POA: Diagnosis present

## 2021-01-21 DIAGNOSIS — F32A Depression, unspecified: Secondary | ICD-10-CM | POA: Diagnosis present

## 2021-01-21 DIAGNOSIS — K861 Other chronic pancreatitis: Secondary | ICD-10-CM | POA: Diagnosis present

## 2021-01-21 DIAGNOSIS — Z833 Family history of diabetes mellitus: Secondary | ICD-10-CM | POA: Diagnosis not present

## 2021-01-21 DIAGNOSIS — S20222A Contusion of left back wall of thorax, initial encounter: Secondary | ICD-10-CM | POA: Diagnosis present

## 2021-01-21 DIAGNOSIS — K219 Gastro-esophageal reflux disease without esophagitis: Secondary | ICD-10-CM | POA: Diagnosis present

## 2021-01-21 DIAGNOSIS — R569 Unspecified convulsions: Secondary | ICD-10-CM

## 2021-01-21 DIAGNOSIS — G894 Chronic pain syndrome: Secondary | ICD-10-CM | POA: Diagnosis present

## 2021-01-21 DIAGNOSIS — E876 Hypokalemia: Secondary | ICD-10-CM | POA: Diagnosis present

## 2021-01-21 DIAGNOSIS — M6282 Rhabdomyolysis: Secondary | ICD-10-CM | POA: Diagnosis present

## 2021-01-21 DIAGNOSIS — W1830XA Fall on same level, unspecified, initial encounter: Secondary | ICD-10-CM | POA: Diagnosis present

## 2021-01-21 DIAGNOSIS — I5032 Chronic diastolic (congestive) heart failure: Secondary | ICD-10-CM | POA: Diagnosis present

## 2021-01-21 DIAGNOSIS — G40409 Other generalized epilepsy and epileptic syndromes, not intractable, without status epilepticus: Secondary | ICD-10-CM | POA: Diagnosis not present

## 2021-01-21 DIAGNOSIS — F17210 Nicotine dependence, cigarettes, uncomplicated: Secondary | ICD-10-CM | POA: Diagnosis present

## 2021-01-21 DIAGNOSIS — G4089 Other seizures: Secondary | ICD-10-CM | POA: Diagnosis present

## 2021-01-21 DIAGNOSIS — F1021 Alcohol dependence, in remission: Secondary | ICD-10-CM | POA: Diagnosis present

## 2021-01-21 DIAGNOSIS — E785 Hyperlipidemia, unspecified: Secondary | ICD-10-CM | POA: Diagnosis present

## 2021-01-21 DIAGNOSIS — Z8249 Family history of ischemic heart disease and other diseases of the circulatory system: Secondary | ICD-10-CM | POA: Diagnosis not present

## 2021-01-21 DIAGNOSIS — Z79899 Other long term (current) drug therapy: Secondary | ICD-10-CM | POA: Diagnosis not present

## 2021-01-21 LAB — COMPREHENSIVE METABOLIC PANEL
ALT: 14 U/L (ref 0–44)
AST: 29 U/L (ref 15–41)
Albumin: 2.9 g/dL — ABNORMAL LOW (ref 3.5–5.0)
Alkaline Phosphatase: 170 U/L — ABNORMAL HIGH (ref 38–126)
Anion gap: 9 (ref 5–15)
BUN: 5 mg/dL — ABNORMAL LOW (ref 6–20)
CO2: 25 mmol/L (ref 22–32)
Calcium: 8.4 mg/dL — ABNORMAL LOW (ref 8.9–10.3)
Chloride: 103 mmol/L (ref 98–111)
Creatinine, Ser: 0.68 mg/dL (ref 0.44–1.00)
GFR, Estimated: >60 mL/min (ref >60)
Glucose, Bld: 95 mg/dL (ref 70–99)
Potassium: 3.6 mmol/L (ref 3.5–5.1)
Sodium: 137 mmol/L (ref 135–145)
Total Bilirubin: 1.3 mg/dL — ABNORMAL HIGH (ref 0.3–1.2)
Total Protein: 5.6 g/dL — ABNORMAL LOW (ref 6.5–8.1)

## 2021-01-21 LAB — CBC
HCT: 31.6 % — ABNORMAL LOW (ref 36.0–46.0)
Hemoglobin: 10.8 g/dL — ABNORMAL LOW (ref 12.0–15.0)
MCH: 32 pg (ref 26.0–34.0)
MCHC: 34.2 g/dL (ref 30.0–36.0)
MCV: 93.8 fL (ref 80.0–100.0)
Platelets: 141 10*3/uL — ABNORMAL LOW (ref 150–400)
RBC: 3.37 MIL/uL — ABNORMAL LOW (ref 3.87–5.11)
RDW: 13.8 % (ref 11.5–15.5)
WBC: 5.2 10*3/uL (ref 4.0–10.5)
nRBC: 0 % (ref 0.0–0.2)

## 2021-01-21 LAB — RESP PANEL BY RT-PCR (FLU A&B, COVID) ARPGX2
Influenza A by PCR: NEGATIVE
Influenza B by PCR: NEGATIVE
SARS Coronavirus 2 by RT PCR: NEGATIVE

## 2021-01-21 LAB — MAGNESIUM
Magnesium: 1.4 mg/dL — ABNORMAL LOW (ref 1.7–2.4)
Magnesium: 1.4 mg/dL — ABNORMAL LOW (ref 1.7–2.4)

## 2021-01-21 LAB — HIV ANTIBODY (ROUTINE TESTING W REFLEX): HIV Screen 4th Generation wRfx: NONREACTIVE

## 2021-01-21 LAB — CK
Total CK: 645 U/L — ABNORMAL HIGH (ref 38–234)
Total CK: 654 U/L — ABNORMAL HIGH (ref 38–234)

## 2021-01-21 MED ORDER — LEVETIRACETAM 500 MG PO TABS
500.0000 mg | ORAL_TABLET | Freq: Two times a day (BID) | ORAL | Status: DC
  Administered 2021-01-21: 500 mg via ORAL
  Filled 2021-01-21: qty 1

## 2021-01-21 MED ORDER — SODIUM CHLORIDE 0.9 % IV SOLN: INTRAVENOUS | Status: AC

## 2021-01-21 MED ORDER — LEVETIRACETAM IN NACL 1000 MG/100ML IV SOLN
1000.0000 mg | Freq: Once | INTRAVENOUS | Status: AC
  Administered 2021-01-21: 1000 mg via INTRAVENOUS
  Filled 2021-01-21: qty 100

## 2021-01-21 MED ORDER — POTASSIUM CHLORIDE CRYS ER 20 MEQ PO TBCR
40.0000 meq | EXTENDED_RELEASE_TABLET | Freq: Two times a day (BID) | ORAL | Status: AC
  Administered 2021-01-21 (×2): 40 meq via ORAL
  Filled 2021-01-21 (×3): qty 2

## 2021-01-21 MED ORDER — MAGNESIUM OXIDE -MG SUPPLEMENT 400 (240 MG) MG PO TABS
400.0000 mg | ORAL_TABLET | Freq: Two times a day (BID) | ORAL | Status: AC
  Administered 2021-01-21 (×2): 400 mg via ORAL
  Filled 2021-01-21 (×2): qty 1

## 2021-01-21 NOTE — ED Notes (Signed)
Pt resting on stretcher, asking for additional pain medication. Pt informed when the next available dose is and states she is ok to wait. Family at bedside and waiting for room assignment. Pt denies any requests at this time. No acute changes noted. Will continue to monitor.

## 2021-01-21 NOTE — Progress Notes (Addendum)
Neurology Progress Note  Patient ID: Kara [name] is a 35 y.o. with PMHx of  has a past medical history of Alcoholism in remission (HCC), Anemia, AR (allergic rhinitis), Biliary dyskinesia, Diastolic CHF (HCC), Environmental allergies, GERD (gastroesophageal reflux disease), Headache, History of acute pancreatitis, History of sepsis (06/12/2016), History of syncope, History of thrombosis (01/29/2015), Hypertension, Polyneuropathy, Prolonged QT syndrome, and Severe persistent asthma.  Initially consulted for: Seizure  Subjective: Patient reports no further events, she does still feel a bit more sluggish than her baseline but otherwise has no new complaints  Exam: Current vital signs: BP (!) 112/95   Pulse 78   Temp 98.5 F (36.9 C) (Oral)   Resp 15   LMP 12/29/2020 (Within Days)   SpO2 97%  Vital signs in last 24 hours: Temp:  [97.9 F (36.6 C)-98.5 F (36.9 C)] 98.5 F (36.9 C) (10/05 0630) Pulse Rate:  [78-104] 78 (10/05 1100) Resp:  [11-26] 15 (10/05 1000) BP: (112-137)/(78-111) 112/95 (10/05 1000) SpO2:  [92 %-100 %] 97 % (10/05 1100) Weight:  [90.5 kg] 90.5 kg (10/04 1438)   Gen: In bed, comfortable  Resp: non-labored breathing, no grossly audible wheezing Cardiac: Perfusing extremities well  Abd: soft, nt Skin: Significant healing bruise on her back secondary to recent seizure  Neuro: MS: Awake, alert, oriented to person/place/situation/time CN: Visual fields full to confrontation, tracks examiner in all directions, face symmetric, tongue midline Motor: No pronator drift of the bilateral upper extremities Sensory: Intact to light touch in all 4 extremities Coordination: Finger-nose intact bilaterally  Pertinent Labs: CK trend 1100-> 654-> 645 Creatinine 0.68 Magnesium low at 1.4  1. No acute intracranial abnormality identified. Specifically, no convincing signal changes seen at the right parietal region to correspond with previously question subarachnoid  hemorrhage on prior CT. This was likely consistent with artifact. 2. Otherwise normal brain MRI.  EEG technically challenging but within these limits no epileptiform activity  Impression: Given multiple events consistent with seizure activity including 1 witnessed by neurologist, patient meets diagnosis for epilepsy with high risk of recurrent seizures and therefore merits antiseizure medication and driving restrictions.  CK is improving and therefore do not feel she is at significant risk of rhabdomyolysis induced kidney injury at this time  Recommendations: -Continue Keppra 500 mg twice daily -Reviewed seizure precautions with patient -No further inpatient neurological work-up, outpatient follow-up with Dr. Epimenio Foot  Please include discharge instructions below in patient's paperwork  "Standard seizure precautions: Per [facility] statutes, patients with seizures are not allowed to drive until  they have been seizure-free for six months. Use caution when using heavy equipment or power tools. Avoid working on ladders or at heights. Take showers instead of baths. Ensure the water temperature is not too high on the home water heater. Do not go swimming alone. When caring for infants or small children, sit down when holding, feeding, or changing them to minimize risk of injury to the child in the event you have a seizure.  To reduce risk of seizures, maintain good sleep hygiene avoid alcohol and illicit drug use, take all anti-seizure medications as prescribed."   Brooke Dare MD-PhD [facility] [phone]   Greater than 35 minutes were spent in care of this patient today, greater than 50% of bedside.  Same day no charge note

## 2021-01-21 NOTE — ED Notes (Signed)
Pt ambulated to the bathroom, with steady gait.

## 2021-01-21 NOTE — ED Notes (Signed)
Lunch ordered 

## 2021-01-21 NOTE — ED Notes (Signed)
Placed Breakfast orders 

## 2021-01-21 NOTE — Progress Notes (Signed)
EEG done at bedside. Noted for future reference= patient had redness with nuprep  scrub for lead application. No skin breakdown noted. Nurse and doctor informed. Results pending.

## 2021-01-21 NOTE — Progress Notes (Signed)
[facility] PROGRESS NOTE    Progress Note  Sham Alviar  QMV:[id number] DOB: [birth year] DOA: 01/20/2021 PCP: [name], MD     Brief Narrative:   Kara [name] is an 35 y.o. female past medical history of essential hypertension hyperlipidemia, chronic pancreatitis associated with chronic pain syndrome and alcohol abuse in remission since 2017 comes in to the ED with a neurologist office for evaluation of fall and seizures.  She was initially seen in the ED on 12/07/2020 for a brief staring spell without jerking movement as per patient the light before she exhibited tonic-clonic movement and was unresponsive with postictal state.  During that visit to the ED she was found to have potassium of 3.3 magnesium 1.2.  These were repleted and she was referred to the neurology as an outpatient.  Seen by the neurologist on 10/20/2020 during examination patient developed seizure-like activity and was sent to the ED. she also had another seizure witnessed by the admitting while her gait was being examined she had about 1/32 tonic-clonic activity and was unresponsive for about a minute and speaking within 4 minutes.  10 minutes after that she was able to answer detailed questions.    Significant Events: 01/20/2021 was seen by the neurologist  Significant studies: 01/20/2021 CT of the head showed trace subarachnoid hemorrhage versus artifact in the deep right parietal sulcus without evidence of acute intracranial injury.  Mild scalp swelling nose fractures or dislocation. 01/20/2021 CT of the cervical spine showed left lower and upper back contusion 01/21/2021 chest x-ray/left shoulder x-ray 01/21/2021 MRI of the brain showed no acute intracranial abnormality corresponding to the question about subarachnoid.  Antibiotics: None  Microbiology data: Blood culture:  Procedures: None  Assessment/Plan:   Generalized tonic-clonic seizure [facility]) Neurology has been consulted recommended an EEG  and seizure precaution. Ativan for seizures as needed. Started on Keppra IV she was loaded with 1000 mg now on p.o. twice daily. She did bite her tongue. Per [facility] statutes, patients with seizures are not allowed to drive until  they have been seizure-free for six months. Use caution when using heavy equipment or power tools. Avoid working on ladders or at heights. Take showers instead of baths. Ensure the water temperature is not too high on the home water heater. Do not go swimming alone. When caring for infants or small children, sit down when holding, feeding, or changing them to minimize risk of injury to the child in the event you have a seizure. Also, Maintain good sleep hygiene. Avoid alcohol  Elevated CK: Start IV fluids recheck a CK in the morning.  Chest x-ray and left shoulder x-ray showed no acute abnormalities.  Electrolyte imbalance type/hypokalemia: Discontinue IV repletion start oral repeat a potassium level in the morning. Replete magnesium orally recheck in the morning.  Prolonged QTC: Sertraline and Seroquel were held, likely due to electrolyte imbalance will replete orally recheck electrolytes in the morning.  Stable asthma: Continue inhalers.  History of chronic pancreatitis/chronic pain syndrome: Continue Creon gabapentin and [place].  Essential hypertension: Continue Toprol-XL.  Depression/insomnia: Holding sertraline for propiram and Seroquel as above. Neurology to dictate when to restart.  Tobacco abuse: Counseling.   DVT prophylaxis: lovenox Family Communication:none Status is: Observation  The patient remains OBS appropriate and will d/c before 2 midnights.  Dispo: The patient is from: Home              Anticipated d/c is to: Home  Patient currently is not medically stable to d/c.   Difficult to place patient No        Code Status:     Code Status Orders  (From admission, onward)           Start     Ordered    01/20/21 2302  Full code  Continuous        01/20/21 2305           Code Status History     Date Active Date Inactive Code Status Order ID Comments User Context   08/05/2020 1808 08/10/2020 1638 Full Code [id number]  Noralee Stain, DO Inpatient   11/07/2019 0349 11/12/2019 1753 Full Code [id number]  Rometta Emery, MD Inpatient   10/17/2019 0608 10/31/2019 1814 Full Code [id number]  Eduard Clos, MD ED   08/26/2016 2230 09/01/2016 1646 Full Code [id number]  Eduard Clos, MD ED   07/30/2016 2241 08/02/2016 2001 Full Code [id number]  Clydie Braun, MD ED   06/12/2016 2121 06/14/2016 1928 Full Code [id number]  Therisa Doyne, MD Inpatient   12/06/2015 2354 12/07/2015 0737 Full Code [id number]  Derwood Kaplan, MD ED   09/09/2015 1654 09/11/2015 2145 Full Code [id number]  Vassie Loll, MD Inpatient   02/10/2015 0102 02/21/2015 1939 Full Code [id number]  Ron Parker, MD Inpatient   01/29/2015 1559 02/09/2015 1425 Full Code [id number]  Marinda Elk, MD ED   07/16/2014 0411 07/19/2014 1527 Full Code [id number]  Eduard Clos, MD Inpatient   07/06/2014 0339 07/06/2014 2042 Full Code [id number]  Rolly Salter, MD Inpatient   04/08/2014 1718 04/09/2014 1952 Full Code [id number]  Rama, Maryruth Bun, MD ED         IV Access:   Peripheral IV   Procedures and diagnostic studies:   DG Chest 2 View  Result Date: 01/20/2021 CLINICAL DATA:  Fall. EXAM: CHEST - 2 VIEW COMPARISON:  Chest x-ray 11/15/2016. FINDINGS: The heart size and mediastinal contours are within normal limits. Both lungs are clear. The visualized skeletal structures are unremarkable. IMPRESSION: No active cardiopulmonary disease. Electronically Signed   By: Darliss Cheney M.D.   On: 01/20/2021 19:39   CT Head Wo Contrast  Result Date: 01/20/2021 CLINICAL DATA:  Seizure, nontraumatic (Age 52-40y); Neck trauma, uncomplicated (NEXUS/CCR neg) (Age 46-64y). EXAM: CT HEAD WITHOUT CONTRAST CT CERVICAL SPINE  WITHOUT CONTRAST TECHNIQUE: Multidetector CT imaging of the head and cervical spine was performed following the standard protocol without intravenous contrast. Multiplanar CT image reconstructions of the cervical spine were also generated. COMPARISON:  Head CT 11/17/2020.  Cervical spine MRI 08/14/2014. FINDINGS: CT HEAD FINDINGS Brain: There is no evidence of an acute infarct, mass, midline shift, or extra-axial fluid collection. There is a 3 mm mildly hyperdense focus at the medial aspect of a deep sulcus in the right parietal region, indeterminate for trace subarachnoid hemorrhage or artifact/image noise. No intracranial hemorrhage is identified elsewhere. The ventricles are normal in size. Vascular: No hyperdense vessel. Skull: No fracture or suspicious osseous lesion. Sinuses/Orbits: Visualized paranasal sinuses and mastoid air cells are clear. Unremarkable orbits. Other: Mild bilateral scalp soft tissue swelling. CT CERVICAL SPINE FINDINGS Alignment: Reversal of the normal cervical lordosis.  No listhesis. Skull base and vertebrae: No acute fracture or suspicious osseous lesion. Soft tissues and spinal canal: No prevertebral fluid or swelling. No visible canal hematoma. Disc levels:  Mild cervical spondylosis. Upper chest: Clear lung apices. Other: Moderately large region increased density in the subcutaneous fat of  the left lower neck and upper back consistent with contusion. IMPRESSION: 1. Trace subarachnoid hemorrhage versus artifact in a deep right parietal sulcus without other evidence of acute intracranial injury. Correlate with pending MRI. 2. Mild scalp swelling. 3. Left lower neck and upper back contusion. 4. No acute cervical spine fracture or subluxation. Electronically Signed   By: Sebastian Ache M.D.   On: 01/20/2021 18:38   CT Cervical Spine Wo Contrast  Result Date: 01/20/2021 CLINICAL DATA:  Seizure, nontraumatic (Age 35-40y); Neck trauma, uncomplicated (NEXUS/CCR neg) (Age 72-64y). EXAM: CT  HEAD WITHOUT CONTRAST CT CERVICAL SPINE WITHOUT CONTRAST TECHNIQUE: Multidetector CT imaging of the head and cervical spine was performed following the standard protocol without intravenous contrast. Multiplanar CT image reconstructions of the cervical spine were also generated. COMPARISON:  Head CT 11/17/2020.  Cervical spine MRI 08/14/2014. FINDINGS: CT HEAD FINDINGS Brain: There is no evidence of an acute infarct, mass, midline shift, or extra-axial fluid collection. There is a 3 mm mildly hyperdense focus at the medial aspect of a deep sulcus in the right parietal region, indeterminate for trace subarachnoid hemorrhage or artifact/image noise. No intracranial hemorrhage is identified elsewhere. The ventricles are normal in size. Vascular: No hyperdense vessel. Skull: No fracture or suspicious osseous lesion. Sinuses/Orbits: Visualized paranasal sinuses and mastoid air cells are clear. Unremarkable orbits. Other: Mild bilateral scalp soft tissue swelling. CT CERVICAL SPINE FINDINGS Alignment: Reversal of the normal cervical lordosis.  No listhesis. Skull base and vertebrae: No acute fracture or suspicious osseous lesion. Soft tissues and spinal canal: No prevertebral fluid or swelling. No visible canal hematoma. Disc levels:  Mild cervical spondylosis. Upper chest: Clear lung apices. Other: Moderately large region increased density in the subcutaneous fat of the left lower neck and upper back consistent with contusion. IMPRESSION: 1. Trace subarachnoid hemorrhage versus artifact in a deep right parietal sulcus without other evidence of acute intracranial injury. Correlate with pending MRI. 2. Mild scalp swelling. 3. Left lower neck and upper back contusion. 4. No acute cervical spine fracture or subluxation. Electronically Signed   By: Sebastian Ache M.D.   On: 01/20/2021 18:38   MR Brain W and Wo Contrast  Result Date: 01/20/2021 CLINICAL DATA:  Initial evaluation for acute seizure, fall, possible subarachnoid  hemorrhage on prior CT. EXAM: MRI HEAD WITHOUT AND WITH CONTRAST TECHNIQUE: Multiplanar, multiecho pulse sequences of the brain and surrounding structures were obtained without and with intravenous contrast. CONTRAST:  2mL GADAVIST GADOBUTROL 1 MMOL/ML IV SOLN COMPARISON:  Prior CT from earlier the same day. FINDINGS: Brain: Cerebral volume within normal limits for age. No focal parenchymal signal abnormality. No abnormal foci of restricted diffusion to suggest acute or subacute ischemia or changes related to seizure. Gray-white matter differentiation maintained. No visible acute intracranial hemorrhage. Specifically, no convincing signal changes seen at the right parietal region to correspond with previously question subarachnoid hemorrhage on prior CT. This is likely consistent with artifact. No mass lesion, midline shift or mass effect. No hydrocephalus or extra-axial fluid collection. Pituitary gland suprasellar region normal. Midline structures intact. No intrinsic temporal lobe abnormality. No abnormal enhancement. Vascular: Major intracranial vascular flow voids are maintained. Skull and upper cervical spine: Craniocervical junction normal. Bone marrow signal intensity within normal limits. Mild scalp swelling again noted. Sinuses/Orbits: Globes and orbital soft tissues demonstrate no acute finding. Paranasal sinuses are largely clear. No mastoid effusion. Inner ear structures within normal limits. Other: None. IMPRESSION: 1. No acute intracranial abnormality identified. Specifically, no convincing signal changes seen at  the right parietal region to correspond with previously question subarachnoid hemorrhage on prior CT. This was likely consistent with artifact. 2. Otherwise normal brain MRI. Electronically Signed   By: Rise Mu M.D.   On: 01/20/2021 21:33   DG Shoulder Left  Result Date: 01/20/2021 CLINICAL DATA:  Fall. EXAM: LEFT SHOULDER - 2+ VIEW COMPARISON:  None. FINDINGS: There is no  evidence of fracture or dislocation. There is no evidence of arthropathy or other focal bone abnormality. Soft tissues are unremarkable. IMPRESSION: Negative. Electronically Signed   By: Darliss Cheney M.D.   On: 01/20/2021 19:39     Medical Consultants:   None.   Subjective:    Cameka Rae she has no complaints this morning except for pain.  Objective:    Vitals:   01/20/21 2300 01/20/21 2330 01/21/21 0331 01/21/21 0630  BP: (!) 129/101 131/86 (!) 125/96 122/88  Pulse: 86 93 95 92  Resp: 20 18 17 15   Temp:   98.4 F (36.9 C) 98.5 F (36.9 C)  TempSrc:   Oral Oral  SpO2: 96% 100% 95% 95%   SpO2: 95 %   Intake/Output Summary (Last 24 hours) at 01/21/2021 0729 Last data filed at 01/21/2021 03/23/2021 Gross per 24 hour  Intake 1002.45 ml  Output --  Net 1002.45 ml   There were no vitals filed for this visit.  Exam: General exam: In no acute distress. Respiratory system: Good air movement and clear to auscultation. Cardiovascular system: S1 & S2 heard, RRR. No JVD. Gastrointestinal system: Abdomen is nondistended, soft and nontender.  Extremities: No pedal edema. Skin: No rashes, lesions or ulcers Psychiatry: Judgement and insight appear normal. Mood & affect appropriate.    Data Reviewed:    Labs: Basic Metabolic Panel: Recent Labs  Lab 01/20/21 1819  NA 136  K 3.0*  CL 100  CO2 25  GLUCOSE 117*  BUN 6  CREATININE 0.74  CALCIUM 8.9   GFR Estimated Creatinine Clearance: 108 mL/min (by C-G formula based on SCr of 0.74 mg/dL). Liver Function Tests: Recent Labs  Lab 01/20/21 1819  AST 38  ALT 15  ALKPHOS 203*  BILITOT 1.4*  PROT 6.6  ALBUMIN 3.4*   No results for input(s): LIPASE, AMYLASE in the last 168 hours. No results for input(s): AMMONIA in the last 168 hours. Coagulation profile No results for input(s): INR, PROTIME in the last 168 hours. COVID-19 Labs  No results for input(s): DDIMER, FERRITIN, LDH, CRP in the last 72 hours.  Lab  Results  Component Value Date   SARSCOV2NAA NEGATIVE 01/21/2021   SARSCOV2NAA NEGATIVE 08/05/2020   SARSCOV2NAA NEGATIVE 11/06/2019   SARSCOV2NAA NEGATIVE 10/17/2019    CBC: Recent Labs  Lab 01/20/21 1819  WBC 7.6  HGB 11.2*  HCT 33.3*  MCV 94.6  PLT 156   Cardiac Enzymes: Recent Labs  Lab 01/20/21 1728  CKTOTAL 1,102*   BNP (last 3 results) No results for input(s): PROBNP in the last 8760 hours. CBG: No results for input(s): GLUCAP in the last 168 hours. D-Dimer: No results for input(s): DDIMER in the last 72 hours. Hgb A1c: No results for input(s): HGBA1C in the last 72 hours. Lipid Profile: No results for input(s): CHOL, HDL, LDLCALC, TRIG, CHOLHDL, LDLDIRECT in the last 72 hours. Thyroid function studies: No results for input(s): TSH, T4TOTAL, T3FREE, THYROIDAB in the last 72 hours.  Invalid input(s): FREET3 Anemia work up: No results for input(s): VITAMINB12, FOLATE, FERRITIN, TIBC, IRON, RETICCTPCT in the last 72 hours. Sepsis Labs:  Recent Labs  Lab 01/20/21 1819  WBC 7.6   Microbiology Recent Results (from the past 240 hour(s))  Resp Panel by RT-PCR (Flu A&B, Covid) Nasopharyngeal Swab     Status: None   Collection Time: 01/21/21 12:38 AM   Specimen: Nasopharyngeal Swab; Nasopharyngeal(NP) swabs in vial transport medium  Result Value Ref Range Status   SARS Coronavirus 2 by RT PCR NEGATIVE NEGATIVE Final    Comment: (NOTE) SARS-CoV-2 target nucleic acids are NOT DETECTED.  The SARS-CoV-2 RNA is generally detectable in upper respiratory specimens during the acute phase of infection. The lowest concentration of SARS-CoV-2 viral copies this assay can detect is 138 copies/mL. A negative result does not preclude SARS-Cov-2 infection and should not be used as the sole basis for treatment or other patient management decisions. A negative result may occur with  improper specimen collection/handling, submission of specimen other than nasopharyngeal swab,  presence of viral mutation(s) within the areas targeted by this assay, and inadequate number of viral copies(<138 copies/mL). A negative result must be combined with clinical observations, patient history, and epidemiological information. The expected result is Negative.  Fact Sheet for Patients:  BloggerCourse.com  Fact Sheet for Healthcare Providers:  SeriousBroker.it  This test is no t yet approved or cleared by the [place] FDA and  has been authorized for detection and/or diagnosis of SARS-CoV-2 by FDA under an Emergency Use Authorization (EUA). This EUA will remain  in effect (meaning this test can be used) for the duration of the COVID-19 declaration under Section 564(b)(1) of the Act, 21 U.S.C.section 360bbb-3(b)(1), unless the authorization is terminated  or revoked sooner.       Influenza A by PCR NEGATIVE NEGATIVE Final   Influenza B by PCR NEGATIVE NEGATIVE Final    Comment: (NOTE) The Xpert Xpress SARS-CoV-2/FLU/RSV plus assay is intended as an aid in the diagnosis of influenza from Nasopharyngeal swab specimens and should not be used as a sole basis for treatment. Nasal washings and aspirates are unacceptable for Xpert Xpress SARS-CoV-2/FLU/RSV testing.  Fact Sheet for Patients: BloggerCourse.com  Fact Sheet for Healthcare Providers: SeriousBroker.it  This test is not yet approved or cleared by the [place] FDA and has been authorized for detection and/or diagnosis of SARS-CoV-2 by FDA under an Emergency Use Authorization (EUA). This EUA will remain in effect (meaning this test can be used) for the duration of the COVID-19 declaration under Section 564(b)(1) of the Act, 21 U.S.C. section 360bbb-3(b)(1), unless the authorization is terminated or revoked.  Performed at [facility], [address], [place], [place] [address]      Medications:     enoxaparin (LOVENOX) injection  40 mg Subcutaneous QHS   fluticasone furoate-vilanterol  1 puff Inhalation Daily   gabapentin  900 mg Oral BID   levETIRAcetam  500 mg Oral BID   lipase/protease/amylase  72,000 Units Oral TID WC   melatonin  10 mg Oral QHS   metoprolol succinate  50 mg Oral Daily   montelukast  10 mg Oral QHS   rosuvastatin  10 mg Oral Daily   Continuous Infusions:  0.9 % NaCl with KCl 40 mEq / L Stopped (01/21/21 0726)      LOS: 0 days   Marinda Elk  [facility]  01/21/2021, 7:29 AM

## 2021-01-21 NOTE — ED Notes (Signed)
Received pt, ao x 4. Complaining of left shoulder pain, 6/10 pain. No acute distress.

## 2021-01-21 NOTE — Consult Note (Signed)
NEURO HOSPITALIST CONSULT NOTE   Requestig physician: Dr. Allena Katz  Reason for Consult:Seizure-like activity  History obtained from:  Patient and Chart     HPI:                                                                                                                                          Kara [name] is an 35 y.o. female who presents to the ED after having a seizure-like spell at Dr. Bonnita Hollow office on Tuesday. She made the Neurology appointment due to a first of lifetime seizure like spell at home a few weeks ago, which had occurred while laying in bed. The spell involved shaking of upper extremities, which were flexed towards her chest/face, biting down her tongue and having no memory of the incident afterwards. Weeks before the above seizure-like spell, the patient would zone out. She also reported that a couple of weeks before she had problem with her dexterity and was unable to grab and tie her hair.   Dr. Bonnita Hollow note has been reviewed: "She was watching TV in bed and next thing she remembers is her husband yelling "Are you ok are you ok".   He witnessed her having a generalized tonic clonic seizure lasting 30 seconds   Arms clenched up and then she started to shake.   She bit the the inside of her mouth.   She had no incontinence.  She was back to baseline fairly quickly.   Her husband told her later that she had a couple staring spells while eating a couple days earlier.   The 24 hours before the event were fairly normal.   No alcohol or drug use.  She thinks she had slept well the previous night. She has no personal history of seizures  She used to drink daily but is sober x 4 years.   She has no FH of seizures.   Her paternal aunt had MS.   She had a 2 day episode of dexterity issues/ataxia with her left hand.   The right was fine.   She felt clumsy in the left arm but not the left leg.   There was no spasticity.   She showed me a video and she had difficulty grasping  a bottle and doing fine motor tasks with the left hand.   I saw her 4 years ago for numbness and mild sensory polyneuropathy.   Gabapentin helped the symptoms and she notes no new issues.   This issue has not worsened over the last 4 years.. She fell recently (she tripped and this was not near the time of the seizure) had a fall on asphalt and has abrasions on legs/toes."  During her visit to Dr. Bonnita Hollow office, she had another seizure like spell, documented in Dr.  Sater's note as follows: "While being examined, she had a seizure witnessed by me.  She had just stood up for me to assess her gait.  She took a few steps.  She then became rigid and fell backwards against the wall.  She did fall to the floor with her head hitting (slid from the wall but had did hit from about a foot up).   She then had 30 seconds of bilateral symmetric clonic activity.  She then became still and was unresponsive for about 1 minute and then was responsive to localize sound.  She began to speak about 3 to 4 minutes later.  About 10 minutes after the seizure she was able to answer detailed questions.    She does not have memory of the event.  Afterwards, she complained of a sore throat.  She did not bite her tongue.  There was no incontinence. She did vomit x1 about 10 minutes after the event.  She denied headache or other issue.  She did not have any numbness or weakness."  Past Medical History:  Diagnosis Date   Alcoholism in remission (HCC)    per pt in remission since 2017   Anemia    AR (allergic rhinitis)    allergist-- dr Lucie Leather   Biliary dyskinesia    Diastolic CHF (HCC)    followd by dr Demetrius Charity. [facility]   Environmental allergies    GERD (gastroesophageal reflux disease)    Headache    History of acute pancreatitis    alcoholic pancreatitis ,  2016 and 2017   History of sepsis 06/12/2016   with CAP   History of syncope    History of thrombosis 01/29/2015   splenic vein thrombosis-- treated with coumadin--- readmitted  for abd. hematoma due to coumadin treated with IR embolization of the IMA branches   Hypertension    Polyneuropathy    Prolonged QT syndrome    cardiologist-- dr Eden Emms (notes in epic)   Severe persistent asthma    pulmonologist-- dr dr Belva Crome (notes in epic)    Past Surgical History:  Procedure Laterality Date   CHOLECYSTECTOMY N/A 06/21/2018   Procedure: LAPAROSCOPIC CHOLECYSTECTOMY;  Surgeon: Abigail Miyamoto, MD;  Location: [facility];  Service: General;  Laterality: N/A;   ESOPHAGOGASTRODUODENOSCOPY N/A 07/17/2014   Procedure: ESOPHAGOGASTRODUODENOSCOPY (EGD);  Surgeon: Dorena Cookey, MD;  Location: [facility];  Service: Endoscopy;  Laterality: N/A;   TYMPANOSTOMY TUBE PLACEMENT Bilateral 11 MONTHS OLD   VIDEO BRONCHOSCOPY Bilateral 10/08/2016   Procedure: VIDEO BRONCHOSCOPY WITH FLUORO;  Surgeon: Chilton Greathouse, MD;  Location: [facility];  Service: Cardiopulmonary;  Laterality: Bilateral;    Family History  Problem Relation Age of Onset   Heart failure Mother    Colon polyps Mother    Heart failure Father    Diabetes Father    Heart failure Maternal Grandfather    Colon cancer Neg Hx           Social History:  reports that she has been smoking cigarettes. She has a 1.25 pack-year smoking history. She has never used smokeless tobacco. She reports that she does not currently use alcohol. She reports that she does not currently use drugs.  Allergies  Allergen Reactions   Acetaminophen Other (See Comments)    Gi Upset   Nsaids Other (See Comments)    Upset stomach    Peanuts [Peanut Oil] Hives and Itching    "ONLY TREE NUTS"   Triamterene-Hctz Nausea Only    dizzy  HOME MEDICATIONS:                                                                                                                      No current facility-administered medications on file prior to encounter.   Current Outpatient Medications on File Prior to Encounter  Medication Sig Dispense Refill    albuterol (PROVENTIL) (2.5 MG/3ML) 0.083% nebulizer solution Take 3 mLs (2.5 mg total) by nebulization every 4 (four) hours as needed for wheezing or shortness of breath. 75 mL 1   albuterol (VENTOLIN HFA) 108 (90 Base) MCG/ACT inhaler 2 puffs every 4 hours as needed for coughing or wheezing. (Patient taking differently: Inhale 2 puffs into the lungs every 6 (six) hours as needed for wheezing or shortness of breath. 2 puffs every 4 hours as needed for coughing or wheezing.) 18 g 1   BREO ELLIPTA 200-25 MCG/INH AEPB INHALE 1 PUFF INTO THE LUNGS DAILY (Patient taking differently: Inhale 1 puff into the lungs daily as needed (shortness of breath/wheezing).) 180 each 1   buPROPion (WELLBUTRIN SR) 150 MG 12 hr tablet Take 150-300 mg by mouth See admin instructions. Takes 1 tablet in the morning and 2 tablets at bedtime     chlorhexidine (PERIDEX) 0.12 % solution Use as directed 15 mLs in the mouth or throat 2 (two) times daily. After meals (Patient taking differently: Use as directed 15 mLs in the mouth or throat every other day. After meals) 2700 mL 1   Cholecalciferol (VITAMIN D PO) Take 1,000 Units by mouth daily.     DUPIXENT 300 MG/2ML prefilled syringe INJECT 300 MG SUBCUTANEOUSLY EVERY OTHER WEEK (Patient taking differently: Inject 300 mg into the skin every 14 (fourteen) days.) 4 mL 11   EPINEPHrine (EPIPEN 2-PAK) 0.3 mg/0.3 mL IJ SOAJ injection Inject 0.3 mLs (0.3 mg total) into the muscle once as needed (for severe allergic reaction). 2 Device 1   gabapentin (NEURONTIN) 300 MG capsule TAKE 3 CAPSULES BY MOUTH TWICE DAILY (Patient taking differently: Take 900 mg by mouth 2 (two) times daily.) 180 capsule 0   lipase/protease/amylase (CREON) 36000 UNITS CPEP capsule Take 72,000 Units by mouth 3 (three) times daily with meals.     magnesium oxide (MAGOX 400) 400 (240 Mg) MG tablet Take 1 tablet (400 mg total) by mouth daily. 30 tablet 0   Melatonin 10 MG TABS Take 10 mg by mouth at bedtime.      metoprolol succinate (TOPROL-XL) 50 MG 24 hr tablet Take 1 tablet (50 mg total) by mouth daily. 90 tablet 0   montelukast (SINGULAIR) 10 MG tablet TAKE ONE TABLET BY MOUTH AT BEDTIME (Patient taking differently: Take 10 mg by mouth at bedtime.) 30 tablet 0   Multiple Vitamins-Minerals (MULTIVITAMIN WITH MINERALS) tablet Take 1 tablet by mouth daily.     omeprazole (PRILOSEC) 40 MG capsule TAKE 1 CAPSULE(40 MG) BY MOUTH DAILY (Patient taking differently: Take 40 mg by mouth at bedtime.) 30 capsule 0   oxyCODONE (ROXICODONE) 15 MG  immediate release tablet Take 15 mg by mouth 4 (four) times daily as needed for pain.     polyethylene glycol (MIRALAX / GLYCOLAX) 17 g packet Take 17 g by mouth daily as needed for mild constipation or moderate constipation. 14 each 0   QUEtiapine (SEROQUEL) 100 MG tablet Take 100 mg by mouth at bedtime.     rosuvastatin (CRESTOR) 10 MG tablet Take 10 mg by mouth daily.     sertraline (ZOLOFT) 50 MG tablet Take 75 mg by mouth daily.     spironolactone (ALDACTONE) 25 MG tablet Take 25 mg by mouth daily as needed (leg swelling).     thiamine 100 MG tablet Take 100 mg by mouth daily.       ROS:                                                                                                                                       As per HPI.    Blood pressure (!) 125/96, pulse 95, temperature 98.4 F (36.9 C), temperature source Oral, resp. rate 17, last menstrual period 12/29/2020, SpO2 95 %.   General Examination:                                                                                                       Physical Exam  HEENT-  [place]/AT   Lungs- Respirations unlabored Extremities- Abrasions noted  Neurological Examination Mental Status: Awake and alert. Fully oriented. Good attention. Speech fluent without evidence of aphasia.  Able to follow all commands without difficulty. Cranial Nerves: II: Temporal visual fields intact with no extinction to DSS. PERRL.    III,IV, VI: No ptosis. EOMI. No nystagmus.  V,VII: Smile symmetric, facial temp sensation equal bilaterally VIII: hearing intact to voice IX,X: No hypophonia XI: Symmetric XII: midline tongue extension Motor: Right : Upper extremity   5/5    Left:     Upper extremity   5/5  Lower extremity   5/5     Lower extremity   5/5 Normal tone throughout; no atrophy noted Sensory: Temp and light touch intact throughout, bilaterally Deep Tendon Reflexes: 2+ and symmetric throughout Plantars: Right: downgoing   Left: downgoing Cerebellar: No ataxia with FNF bilaterally  Gait: Deferred   Lab Results: Basic Metabolic Panel: Recent Labs  Lab 01/20/21 1819  NA 136  K 3.0*  CL 100  CO2 25  GLUCOSE 117*  BUN 6  CREATININE 0.74  CALCIUM 8.9    CBC: Recent  Labs  Lab 01/20/21 1819  WBC 7.6  HGB 11.2*  HCT 33.3*  MCV 94.6  PLT 156    Cardiac Enzymes: Recent Labs  Lab 01/20/21 1728  CKTOTAL 1,102*    Lipid Panel: No results for input(s): CHOL, TRIG, HDL, CHOLHDL, VLDL, LDLCALC in the last 168 hours.  Imaging: DG Chest 2 View  Result Date: 01/20/2021 CLINICAL DATA:  Fall. EXAM: CHEST - 2 VIEW COMPARISON:  Chest x-ray 11/15/2016. FINDINGS: The heart size and mediastinal contours are within normal limits. Both lungs are clear. The visualized skeletal structures are unremarkable. IMPRESSION: No active cardiopulmonary disease. Electronically Signed   By: Darliss Cheney M.D.   On: 01/20/2021 19:39   CT Head Wo Contrast  Result Date: 01/20/2021 CLINICAL DATA:  Seizure, nontraumatic (Age 56-40y); Neck trauma, uncomplicated (NEXUS/CCR neg) (Age 62-64y). EXAM: CT HEAD WITHOUT CONTRAST CT CERVICAL SPINE WITHOUT CONTRAST TECHNIQUE: Multidetector CT imaging of the head and cervical spine was performed following the standard protocol without intravenous contrast. Multiplanar CT image reconstructions of the cervical spine were also generated. COMPARISON:  Head CT 11/17/2020.  Cervical spine MRI  08/14/2014. FINDINGS: CT HEAD FINDINGS Brain: There is no evidence of an acute infarct, mass, midline shift, or extra-axial fluid collection. There is a 3 mm mildly hyperdense focus at the medial aspect of a deep sulcus in the right parietal region, indeterminate for trace subarachnoid hemorrhage or artifact/image noise. No intracranial hemorrhage is identified elsewhere. The ventricles are normal in size. Vascular: No hyperdense vessel. Skull: No fracture or suspicious osseous lesion. Sinuses/Orbits: Visualized paranasal sinuses and mastoid air cells are clear. Unremarkable orbits. Other: Mild bilateral scalp soft tissue swelling. CT CERVICAL SPINE FINDINGS Alignment: Reversal of the normal cervical lordosis.  No listhesis. Skull base and vertebrae: No acute fracture or suspicious osseous lesion. Soft tissues and spinal canal: No prevertebral fluid or swelling. No visible canal hematoma. Disc levels:  Mild cervical spondylosis. Upper chest: Clear lung apices. Other: Moderately large region increased density in the subcutaneous fat of the left lower neck and upper back consistent with contusion. IMPRESSION: 1. Trace subarachnoid hemorrhage versus artifact in a deep right parietal sulcus without other evidence of acute intracranial injury. Correlate with pending MRI. 2. Mild scalp swelling. 3. Left lower neck and upper back contusion. 4. No acute cervical spine fracture or subluxation. Electronically Signed   By: Sebastian Ache M.D.   On: 01/20/2021 18:38   CT Cervical Spine Wo Contrast  Result Date: 01/20/2021 CLINICAL DATA:  Seizure, nontraumatic (Age 56-40y); Neck trauma, uncomplicated (NEXUS/CCR neg) (Age 71-64y). EXAM: CT HEAD WITHOUT CONTRAST CT CERVICAL SPINE WITHOUT CONTRAST TECHNIQUE: Multidetector CT imaging of the head and cervical spine was performed following the standard protocol without intravenous contrast. Multiplanar CT image reconstructions of the cervical spine were also generated. COMPARISON:   Head CT 11/17/2020.  Cervical spine MRI 08/14/2014. FINDINGS: CT HEAD FINDINGS Brain: There is no evidence of an acute infarct, mass, midline shift, or extra-axial fluid collection. There is a 3 mm mildly hyperdense focus at the medial aspect of a deep sulcus in the right parietal region, indeterminate for trace subarachnoid hemorrhage or artifact/image noise. No intracranial hemorrhage is identified elsewhere. The ventricles are normal in size. Vascular: No hyperdense vessel. Skull: No fracture or suspicious osseous lesion. Sinuses/Orbits: Visualized paranasal sinuses and mastoid air cells are clear. Unremarkable orbits. Other: Mild bilateral scalp soft tissue swelling. CT CERVICAL SPINE FINDINGS Alignment: Reversal of the normal cervical lordosis.  No listhesis. Skull base and vertebrae: No acute  fracture or suspicious osseous lesion. Soft tissues and spinal canal: No prevertebral fluid or swelling. No visible canal hematoma. Disc levels:  Mild cervical spondylosis. Upper chest: Clear lung apices. Other: Moderately large region increased density in the subcutaneous fat of the left lower neck and upper back consistent with contusion. IMPRESSION: 1. Trace subarachnoid hemorrhage versus artifact in a deep right parietal sulcus without other evidence of acute intracranial injury. Correlate with pending MRI. 2. Mild scalp swelling. 3. Left lower neck and upper back contusion. 4. No acute cervical spine fracture or subluxation. Electronically Signed   By: Sebastian Ache M.D.   On: 01/20/2021 18:38   MR Brain W and Wo Contrast  Result Date: 01/20/2021 CLINICAL DATA:  Initial evaluation for acute seizure, fall, possible subarachnoid hemorrhage on prior CT. EXAM: MRI HEAD WITHOUT AND WITH CONTRAST TECHNIQUE: Multiplanar, multiecho pulse sequences of the brain and surrounding structures were obtained without and with intravenous contrast. CONTRAST:  45mL GADAVIST GADOBUTROL 1 MMOL/ML IV SOLN COMPARISON:  Prior CT from  earlier the same day. FINDINGS: Brain: Cerebral volume within normal limits for age. No focal parenchymal signal abnormality. No abnormal foci of restricted diffusion to suggest acute or subacute ischemia or changes related to seizure. Gray-white matter differentiation maintained. No visible acute intracranial hemorrhage. Specifically, no convincing signal changes seen at the right parietal region to correspond with previously question subarachnoid hemorrhage on prior CT. This is likely consistent with artifact. No mass lesion, midline shift or mass effect. No hydrocephalus or extra-axial fluid collection. Pituitary gland suprasellar region normal. Midline structures intact. No intrinsic temporal lobe abnormality. No abnormal enhancement. Vascular: Major intracranial vascular flow voids are maintained. Skull and upper cervical spine: Craniocervical junction normal. Bone marrow signal intensity within normal limits. Mild scalp swelling again noted. Sinuses/Orbits: Globes and orbital soft tissues demonstrate no acute finding. Paranasal sinuses are largely clear. No mastoid effusion. Inner ear structures within normal limits. Other: None. IMPRESSION: 1. No acute intracranial abnormality identified. Specifically, no convincing signal changes seen at the right parietal region to correspond with previously question subarachnoid hemorrhage on prior CT. This was likely consistent with artifact. 2. Otherwise normal brain MRI. Electronically Signed   By: Rise Mu M.D.   On: 01/20/2021 21:33   DG Shoulder Left  Result Date: 01/20/2021 CLINICAL DATA:  Fall. EXAM: LEFT SHOULDER - 2+ VIEW COMPARISON:  None. FINDINGS: There is no evidence of fracture or dislocation. There is no evidence of arthropathy or other focal bone abnormality. Soft tissues are unremarkable. IMPRESSION: Negative. Electronically Signed   By: Darliss Cheney M.D.   On: 01/20/2021 19:39     Assessment: 35 year old female presenting to the ED  after a seizure-like spell during Neurology outpatient evaluation with Dr. Epimenio Foot. Had one prior episode of seizure-like activity a few weeks prior, which had been the reason for her appointment.  1. Exam is nonfocal.  2. MRI brain with and without contrast: No acute intracranial abnormality identified. Specifically, no convincing signal changes seen at the right parietal region to correspond with previously question subarachnoid hemorrhage on prior CT. This was likely consistent with artifact.  3. Na and Ca levels are normal.  4. Elevated CK secondary to seizure versus fall 5. UTOX negative for cocaine, amphetamine and THC. Positive for opiates.   Recommendations: 1. EEG (ordered).   2. Close monitoring 3. Inpatient seizure precautions 4. Outpatient seizure precautions: Per [employer] statutes, patients with seizures are not allowed to drive until  they have been  seizure-free for six months. Use caution when using heavy equipment or power tools. Avoid working on ladders or at heights. Take showers instead of baths. Ensure the water temperature is not too high on the home water heater. Do not go swimming alone. When caring for infants or small children, sit down when holding, feeding, or changing them to minimize risk of injury to the child in the event you have a seizure. Also, Maintain good sleep hygiene. Avoid alcohol. 5. IV Ativan PRN seizure recurrence.  6. Mg level (ordered) 7. Starting Keppra per Dr. Bonnita Hollow recommendation from his clinic note on Tuesday. Will load with 1000 mg IV followed by 500 mg PO BID.      Electronically signed: Dr. Caryl Pina 01/21/2021, 6:32 AM

## 2021-01-21 NOTE — Procedures (Signed)
Patient Name: Casee Knepp  MRN: [id number]  Epilepsy Attending: Charlsie Quest  Referring Physician/Provider: Dr Caryl Pina Date: 01/21/2021 Duration: 23.09 mins  Patient history: 35 year old female presenting to the ED after a seizure-like spell during Neurology outpatient evaluation with Dr. Epimenio Foot. Had one prior episode of seizure-like activity a few weeks prior, which had been the reason for her appointment.  EEG to evaluate for seizures.  Level of alertness: Awake  AEDs during EEG study: LEV, GBP  Technical aspects: This EEG study was done with scalp electrodes positioned according to the 10-20 International system of electrode placement. Electrical activity was acquired at a sampling rate of 500Hz  and reviewed with a high frequency filter of 70Hz  and a low frequency filter of 1Hz . EEG data were recorded continuously and digitally stored.   Description: The posterior dominant rhythm consists of 8 Hz activity of moderate voltage (25-35 uV) seen predominantly in posterior head regions, symmetric and reactive to eye opening and eye closing. Physiologic photic driving was not seen during photic stimulation.  Hyperventilation was not performed.     Of note, parts of study were technically difficult due to significant myogenic artifact.  IMPRESSION: This technically difficult study is within normal limits. No seizures or epileptiform discharges were seen throughout the recording.  Hailey Stormer 

## 2021-01-22 ENCOUNTER — Other Ambulatory Visit (HOSPITAL_COMMUNITY): Payer: Self-pay

## 2021-01-22 DIAGNOSIS — J4550 Severe persistent asthma, uncomplicated: Secondary | ICD-10-CM

## 2021-01-22 MED ORDER — MAGNESIUM OXIDE -MG SUPPLEMENT 400 (240 MG) MG PO TABS: 400.0000 mg | ORAL_TABLET | Freq: Two times a day (BID) | ORAL | Status: DC

## 2021-01-22 MED ORDER — POTASSIUM CHLORIDE CRYS ER 20 MEQ PO TBCR
40.0000 meq | EXTENDED_RELEASE_TABLET | Freq: Two times a day (BID) | ORAL | Status: DC
  Administered 2021-01-22: 40 meq via ORAL
  Filled 2021-01-22: qty 2

## 2021-01-22 MED ORDER — MAGNESIUM SULFATE 2 GM/50ML IV SOLN
2.0000 g | Freq: Once | INTRAVENOUS | Status: AC
  Administered 2021-01-22: 2 g via INTRAVENOUS
  Filled 2021-01-22: qty 50

## 2021-01-22 MED ORDER — LEVETIRACETAM 500 MG PO TABS
500.0000 mg | ORAL_TABLET | Freq: Two times a day (BID) | ORAL | 3 refills | Status: DC
  Filled 2021-01-22: qty 60, 30d supply, fill #0

## 2021-01-22 NOTE — Discharge Summary (Signed)
Physician Discharge Summary  Kara [name] WUJ:[id number] DOB: 01/01/1986 DOA: 01/20/2021  PCP: [name], MD  Admit date: 01/20/2021 Discharge date: 01/22/2021  Admitted From: Home Disposition:  Home  Recommendations for Outpatient Follow-up:  Follow up with Neurologist in 1-2 weeks Please obtain BMP/CBC in one week Please follow up on the following pending results: Per [facility] statutes, patients with seizures are not allowed to drive until  they have been seizure-free for six months. Use caution when using heavy equipment or power tools. Avoid working on ladders or at heights. Take showers instead of baths. Ensure the water temperature is not too high on the home water heater. Do not go swimming alone. When caring for infants or small children, sit down when holding, feeding, or changing them to minimize risk of injury to the child in the event you have a seizure.  To reduce risk of seizures, maintain good sleep hygiene avoid alcohol and illicit drug use, take all anti-seizure medications as prescribed  Home Health:no Equipment/Devices:none  Discharge Condition:Stable CODE STATUS:Full Diet recommendation: Heart Healthy  Brief/Interim Summary: 35 y.o. female past medical history of essential hypertension hyperlipidemia, chronic pancreatitis associated with chronic pain syndrome and alcohol abuse in remission since 2017 comes in to the ED with a neurologist office for evaluation of fall and seizures.  She was initially seen in the ED on 12/07/2020 for a brief staring spell without jerking movement as per patient the light before she exhibited tonic-clonic movement and was unresponsive with postictal state.  During that visit to the ED she was found to have potassium of 3.3 magnesium 1.2.  These were repleted and she was referred to the neurology as an outpatient.  Seen by the neurologist on 10/20/2020 during examination patient developed seizure-like activity and was sent to the  ED. she also had another seizure witnessed by the admitting while her gait was being examined she had about 1/32 tonic-clonic activity and was unresponsive for about a minute and speaking within 4 minutes.  10 minutes after that she was able to answer detailed questions.  Significant studies: 01/20/2021 CT of the head showed trace subarachnoid hemorrhage versus artifact in the deep right parietal sulcus without evidence of acute intracranial injury.  Mild scalp swelling nose fractures or dislocation. 01/20/2021 CT of the cervical spine showed left lower and upper back contusion 01/21/2021 chest x-ray/left shoulder x-ray 01/21/2021 MRI of the brain showed no acute intracranial abnormality corresponding to the question about subarachnoid.  Discharge Diagnoses:  Principal Problem:   Generalized tonic-clonic seizure (HCC) Active Problems:   Hypokalemia   Hypertension   Chronic pancreatitis (HCC)   Asthma, severe persistent, well-controlled   Elevated CK   Depression  Generalized tonic-clonic seizures: Neurology was consulted recommended EEG, was given Ativan in the ED for seizure loaded with Keppra and continued on Keppra twice a day. Imaging as above neurology recommended to continue Keppra and follow-up with neurology as an outpatient.  Rhabdomyolysis: In the thousands was started on IV fluids and improved.  Now resolved.  Prolonged QTC: No change made to her medication. Likely due to electrolyte imbalance.  Electrolyte imbalance hypomagnesemia/hypokalemia: These were repleted orally recheck in the morning now improved. Try to keep potassium greater than 4 magnesium greater than 2.  Stable asthma: No change made to her inhalers.  History of chronic pancreatitis/chronic pain syndrome: No change made to her medication continue Creon, gabapentin and Oxy.  Essential hypertension: Well-controlled continue Toprol.  Depression/insomnia: No change made to her medication continue  Seroquel and sertraline.  Tobacco abuse: Counseling.     Discharge Instructions  Discharge Instructions     Diet - low sodium heart healthy   Complete by: As directed    Increase activity slowly   Complete by: As directed       Allergies as of 01/22/2021       Reactions   Acetaminophen Other (See Comments)   Gi Upset   Nsaids Other (See Comments)   Upset stomach    Peanuts [peanut Oil] Hives, Itching   "ONLY TREE NUTS"   Triamterene-hctz Nausea Only   dizzy        Medication List     STOP taking these medications    chlorhexidine 0.12 % solution Commonly known as: PERIDEX   thiamine 100 MG tablet       TAKE these medications    albuterol 108 (90 Base) MCG/ACT inhaler Commonly known as: VENTOLIN HFA 2 puffs every 4 hours as needed for coughing or wheezing. What changed:  how much to take how to take this when to take this reasons to take this Another medication with the same name was removed. Continue taking this medication, and follow the directions you see here.   Breo Ellipta 200-25 MCG/INH Aepb Generic drug: fluticasone furoate-vilanterol INHALE 1 PUFF INTO THE LUNGS DAILY What changed:  when to take this reasons to take this   buPROPion 150 MG 12 hr tablet Commonly known as: WELLBUTRIN SR Take 150-300 mg by mouth See admin instructions. Takes 1 tablet in the morning and 2 tablets at bedtime   Dupixent 300 MG/2ML prefilled syringe Generic drug: dupilumab INJECT 300 MG SUBCUTANEOUSLY EVERY OTHER WEEK What changed: See the new instructions.   EPINEPHrine 0.3 mg/0.3 mL Soaj injection Commonly known as: EpiPen 2-Pak Inject 0.3 mLs (0.3 mg total) into the muscle once as needed (for severe allergic reaction).   gabapentin 300 MG capsule Commonly known as: NEURONTIN TAKE 3 CAPSULES BY MOUTH TWICE DAILY   levETIRAcetam 500 MG tablet Commonly known as: KEPPRA Take 1 tablet (500 mg total) by mouth 2 (two) times daily.    lipase/protease/amylase 10272 UNITS Cpep capsule Commonly known as: CREON Take 72,000 Units by mouth 3 (three) times daily with meals.   magnesium oxide 400 (240 Mg) MG tablet Commonly known as: MagOx 400 Take 1 tablet (400 mg total) by mouth daily.   Melatonin 10 MG Tabs Take 10 mg by mouth at bedtime.   metoprolol succinate 50 MG 24 hr tablet Commonly known as: TOPROL-XL Take 1 tablet (50 mg total) by mouth daily.   montelukast 10 MG tablet Commonly known as: SINGULAIR TAKE ONE TABLET BY MOUTH AT BEDTIME What changed:  how much to take how to take this when to take this additional instructions   multivitamin with minerals tablet Take 1 tablet by mouth daily.   omeprazole 40 MG capsule Commonly known as: PRILOSEC TAKE 1 CAPSULE(40 MG) BY MOUTH DAILY What changed: See the new instructions.   oxyCODONE 15 MG immediate release tablet Commonly known as: ROXICODONE Take 15 mg by mouth 4 (four) times daily as needed for pain.   polyethylene glycol 17 g packet Commonly known as: MIRALAX / GLYCOLAX Take 17 g by mouth daily as needed for mild constipation or moderate constipation.   QUEtiapine 100 MG tablet Commonly known as: SEROQUEL Take 100 mg by mouth at bedtime.   rosuvastatin 10 MG tablet Commonly known as: CRESTOR Take 10 mg by mouth daily.   sertraline 50 MG  tablet Commonly known as: ZOLOFT Take 75 mg by mouth daily.   spironolactone 25 MG tablet Commonly known as: ALDACTONE Take 25 mg by mouth daily as needed (leg swelling).   VITAMIN D PO Take 1,000 Units by mouth daily.        Allergies  Allergen Reactions   Acetaminophen Other (See Comments)    Gi Upset   Nsaids Other (See Comments)    Upset stomach    Peanuts [Peanut Oil] Hives and Itching    "ONLY TREE NUTS"   Triamterene-Hctz Nausea Only    dizzy    Consultations: Neurology   Procedures/Studies: DG Chest 2 View  Result Date: 01/20/2021 CLINICAL DATA:  Fall. EXAM: CHEST - 2  VIEW COMPARISON:  Chest x-ray 11/15/2016. FINDINGS: The heart size and mediastinal contours are within normal limits. Both lungs are clear. The visualized skeletal structures are unremarkable. IMPRESSION: No active cardiopulmonary disease. Electronically Signed   By: Darliss Cheney M.D.   On: 01/20/2021 19:39   CT Head Wo Contrast  Result Date: 01/20/2021 CLINICAL DATA:  Seizure, nontraumatic (Age 98-40y); Neck trauma, uncomplicated (NEXUS/CCR neg) (Age 46-64y). EXAM: CT HEAD WITHOUT CONTRAST CT CERVICAL SPINE WITHOUT CONTRAST TECHNIQUE: Multidetector CT imaging of the head and cervical spine was performed following the standard protocol without intravenous contrast. Multiplanar CT image reconstructions of the cervical spine were also generated. COMPARISON:  Head CT 11/17/2020.  Cervical spine MRI 08/14/2014. FINDINGS: CT HEAD FINDINGS Brain: There is no evidence of an acute infarct, mass, midline shift, or extra-axial fluid collection. There is a 3 mm mildly hyperdense focus at the medial aspect of a deep sulcus in the right parietal region, indeterminate for trace subarachnoid hemorrhage or artifact/image noise. No intracranial hemorrhage is identified elsewhere. The ventricles are normal in size. Vascular: No hyperdense vessel. Skull: No fracture or suspicious osseous lesion. Sinuses/Orbits: Visualized paranasal sinuses and mastoid air cells are clear. Unremarkable orbits. Other: Mild bilateral scalp soft tissue swelling. CT CERVICAL SPINE FINDINGS Alignment: Reversal of the normal cervical lordosis.  No listhesis. Skull base and vertebrae: No acute fracture or suspicious osseous lesion. Soft tissues and spinal canal: No prevertebral fluid or swelling. No visible canal hematoma. Disc levels:  Mild cervical spondylosis. Upper chest: Clear lung apices. Other: Moderately large region increased density in the subcutaneous fat of the left lower neck and upper back consistent with contusion. IMPRESSION: 1. Trace  subarachnoid hemorrhage versus artifact in a deep right parietal sulcus without other evidence of acute intracranial injury. Correlate with pending MRI. 2. Mild scalp swelling. 3. Left lower neck and upper back contusion. 4. No acute cervical spine fracture or subluxation. Electronically Signed   By: Sebastian Ache M.D.   On: 01/20/2021 18:38   CT Cervical Spine Wo Contrast  Result Date: 01/20/2021 CLINICAL DATA:  Seizure, nontraumatic (Age 98-40y); Neck trauma, uncomplicated (NEXUS/CCR neg) (Age 32-64y). EXAM: CT HEAD WITHOUT CONTRAST CT CERVICAL SPINE WITHOUT CONTRAST TECHNIQUE: Multidetector CT imaging of the head and cervical spine was performed following the standard protocol without intravenous contrast. Multiplanar CT image reconstructions of the cervical spine were also generated. COMPARISON:  Head CT 11/17/2020.  Cervical spine MRI 08/14/2014. FINDINGS: CT HEAD FINDINGS Brain: There is no evidence of an acute infarct, mass, midline shift, or extra-axial fluid collection. There is a 3 mm mildly hyperdense focus at the medial aspect of a deep sulcus in the right parietal region, indeterminate for trace subarachnoid hemorrhage or artifact/image noise. No intracranial hemorrhage is identified elsewhere. The ventricles are normal in  size. Vascular: No hyperdense vessel. Skull: No fracture or suspicious osseous lesion. Sinuses/Orbits: Visualized paranasal sinuses and mastoid air cells are clear. Unremarkable orbits. Other: Mild bilateral scalp soft tissue swelling. CT CERVICAL SPINE FINDINGS Alignment: Reversal of the normal cervical lordosis.  No listhesis. Skull base and vertebrae: No acute fracture or suspicious osseous lesion. Soft tissues and spinal canal: No prevertebral fluid or swelling. No visible canal hematoma. Disc levels:  Mild cervical spondylosis. Upper chest: Clear lung apices. Other: Moderately large region increased density in the subcutaneous fat of the left lower neck and upper back  consistent with contusion. IMPRESSION: 1. Trace subarachnoid hemorrhage versus artifact in a deep right parietal sulcus without other evidence of acute intracranial injury. Correlate with pending MRI. 2. Mild scalp swelling. 3. Left lower neck and upper back contusion. 4. No acute cervical spine fracture or subluxation. Electronically Signed   By: Sebastian Ache M.D.   On: 01/20/2021 18:38   MR Brain W and Wo Contrast  Result Date: 01/20/2021 CLINICAL DATA:  Initial evaluation for acute seizure, fall, possible subarachnoid hemorrhage on prior CT. EXAM: MRI HEAD WITHOUT AND WITH CONTRAST TECHNIQUE: Multiplanar, multiecho pulse sequences of the brain and surrounding structures were obtained without and with intravenous contrast. CONTRAST:  48mL GADAVIST GADOBUTROL 1 MMOL/ML IV SOLN COMPARISON:  Prior CT from earlier the same day. FINDINGS: Brain: Cerebral volume within normal limits for age. No focal parenchymal signal abnormality. No abnormal foci of restricted diffusion to suggest acute or subacute ischemia or changes related to seizure. Gray-white matter differentiation maintained. No visible acute intracranial hemorrhage. Specifically, no convincing signal changes seen at the right parietal region to correspond with previously question subarachnoid hemorrhage on prior CT. This is likely consistent with artifact. No mass lesion, midline shift or mass effect. No hydrocephalus or extra-axial fluid collection. Pituitary gland suprasellar region normal. Midline structures intact. No intrinsic temporal lobe abnormality. No abnormal enhancement. Vascular: Major intracranial vascular flow voids are maintained. Skull and upper cervical spine: Craniocervical junction normal. Bone marrow signal intensity within normal limits. Mild scalp swelling again noted. Sinuses/Orbits: Globes and orbital soft tissues demonstrate no acute finding. Paranasal sinuses are largely clear. No mastoid effusion. Inner ear structures within  normal limits. Other: None. IMPRESSION: 1. No acute intracranial abnormality identified. Specifically, no convincing signal changes seen at the right parietal region to correspond with previously question subarachnoid hemorrhage on prior CT. This was likely consistent with artifact. 2. Otherwise normal brain MRI. Electronically Signed   By: Rise Mu M.D.   On: 01/20/2021 21:33   DG Shoulder Left  Result Date: 01/20/2021 CLINICAL DATA:  Fall. EXAM: LEFT SHOULDER - 2+ VIEW COMPARISON:  None. FINDINGS: There is no evidence of fracture or dislocation. There is no evidence of arthropathy or other focal bone abnormality. Soft tissues are unremarkable. IMPRESSION: Negative. Electronically Signed   By: Darliss Cheney M.D.   On: 01/20/2021 19:39   EEG adult  Result Date: 01/21/2021 Charlsie Quest, MD     01/21/2021 11:11 AM Patient Name: Kara [name] MRN: [id number] Epilepsy Attending: Charlsie Quest Referring Physician/Provider: Dr Caryl Pina Date: 01/21/2021 Duration: 23.09 mins Patient history: 35 year old female presenting to the ED after a seizure-like spell during Neurology outpatient evaluation with Dr. Epimenio Foot. Had one prior episode of seizure-like activity a few weeks prior, which had been the reason for her appointment.  EEG to evaluate for seizures. Level of alertness: Awake AEDs during EEG study: LEV, GBP Technical aspects: This EEG study was  done with scalp electrodes positioned according to the 10-20 International system of electrode placement. Electrical activity was acquired at a sampling rate of 500Hz  and reviewed with a high frequency filter of 70Hz  and a low frequency filter of 1Hz . EEG data were recorded continuously and digitally stored. Description: The posterior dominant rhythm consists of 8 Hz activity of moderate voltage (25-35 uV) seen predominantly in posterior head regions, symmetric and reactive to eye opening and eye closing. Physiologic photic driving was not seen during  photic stimulation.  Hyperventilation was not performed.   Of note, parts of study were technically difficult due to significant myogenic artifact. IMPRESSION: This technically difficult study is within normal limits. No seizures or epileptiform discharges were seen throughout the recording. Priyanka   (Echo, Carotid, EGD, Colonoscopy, ERCP)    Subjective: No complaints  Discharge Exam: Vitals:   01/22/21 0500 01/22/21 0700  BP: (!) 146/92 123/81  Pulse: 90 79  Resp: 16 17  Temp:  98.2 F (36.8 C)  SpO2: 98% 97%   Vitals:   01/22/21 0200 01/22/21 0400 01/22/21 0500 01/22/21 0700  BP: (!) 132/97 133/89 (!) 146/92 123/81  Pulse: (!) 117 88 90 79  Resp:  14 16 17   Temp:    98.2 F (36.8 C)  TempSrc:    Oral  SpO2: 96% 98% 98% 97%    General: Pt is alert, awake, not in acute distress Cardiovascular: RRR, S1/S2 +, no rubs, no gallops Respiratory: CTA bilaterally, no wheezing, no rhonchi Abdominal: Soft, NT, ND, bowel sounds + Extremities: no edema, no cyanosis    The results of significant diagnostics from this hospitalization (including imaging, microbiology, ancillary and laboratory) are listed below for reference.     Microbiology: Recent Results (from the past 240 hour(s))  Resp Panel by RT-PCR (Flu A&B, Covid) Nasopharyngeal Swab     Status: None   Collection Time: 01/21/21 12:38 AM   Specimen: Nasopharyngeal Swab; Nasopharyngeal(NP) swabs in vial transport medium  Result Value Ref Range Status   SARS Coronavirus 2 by RT PCR NEGATIVE NEGATIVE Final    Comment: (NOTE) SARS-CoV-2 target nucleic acids are NOT DETECTED.  The SARS-CoV-2 RNA is generally detectable in upper respiratory specimens during the acute phase of infection. The lowest concentration of SARS-CoV-2 viral copies this assay can detect is 138 copies/mL. A negative result does not preclude SARS-Cov-2 infection and should not be used as the sole basis for treatment or other patient management  decisions. A negative result may occur with  improper specimen collection/handling, submission of specimen other than nasopharyngeal swab, presence of viral mutation(s) within the areas targeted by this assay, and inadequate number of viral copies(<138 copies/mL). A negative result must be combined with clinical observations, patient history, and epidemiological information. The expected result is Negative.  Fact Sheet for Patients:  03/24/21  Fact Sheet for Healthcare Providers:  03/24/21  This test is no t yet approved or cleared by the FDA and  has been authorized for detection and/or diagnosis of SARS-CoV-2 by FDA under an Emergency Use Authorization (EUA). This EUA will remain  in effect (meaning this test can be used) for the duration of the COVID-19 declaration under Section 564(b)(1) of the Act, 21 U.S.C.section 360bbb-3(b)(1), unless the authorization is terminated  or revoked sooner.       Influenza A by PCR NEGATIVE NEGATIVE Final   Influenza B by PCR NEGATIVE NEGATIVE Final    Comment: (NOTE) The Xpert Xpress SARS-CoV-2/FLU/RSV plus assay is intended  as an aid in the diagnosis of influenza from Nasopharyngeal swab specimens and should not be used as a sole basis for treatment. Nasal washings and aspirates are unacceptable for Xpert Xpress SARS-CoV-2/FLU/RSV testing.  Fact Sheet for Patients: BloggerCourse.com  Fact Sheet for Healthcare Providers: SeriousBroker.it  This test is not yet approved or cleared by the [place] FDA and has been authorized for detection and/or diagnosis of SARS-CoV-2 by FDA under an Emergency Use Authorization (EUA). This EUA will remain in effect (meaning this test can be used) for the duration of the COVID-19 declaration under Section 564(b)(1) of the Act, 21 U.S.C. section 360bbb-3(b)(1), unless the  authorization is terminated or revoked.  Performed at [facility], [address], [place], [place] [address]      Labs: BNP (last 3 results) Recent Labs    12/07/20 1043  BNP 132.9*   Basic Metabolic Panel: Recent Labs  Lab 01/20/21 1819 01/21/21 0732  NA 136 137  K 3.0* 3.6  CL 100 103  CO2 25 25  GLUCOSE 117* 95  BUN 6 5*  CREATININE 0.74 0.68  CALCIUM 8.9 8.4*  MG  --  1.4*  1.4*   Liver Function Tests: Recent Labs  Lab 01/20/21 1819 01/21/21 0732  AST 38 29  ALT 15 14  ALKPHOS 203* 170*  BILITOT 1.4* 1.3*  PROT 6.6 5.6*  ALBUMIN 3.4* 2.9*   No results for input(s): LIPASE, AMYLASE in the last 168 hours. No results for input(s): AMMONIA in the last 168 hours. CBC: Recent Labs  Lab 01/20/21 1819 01/21/21 0732  WBC 7.6 5.2  HGB 11.2* 10.8*  HCT 33.3* 31.6*  MCV 94.6 93.8  PLT 156 141*   Cardiac Enzymes: Recent Labs  Lab 01/20/21 1728 01/21/21 0732 01/21/21 0733  CKTOTAL 1,102* 654* 645*   BNP: Invalid input(s): POCBNP CBG: No results for input(s): GLUCAP in the last 168 hours. D-Dimer No results for input(s): DDIMER in the last 72 hours. Hgb A1c No results for input(s): HGBA1C in the last 72 hours. Lipid Profile No results for input(s): CHOL, HDL, LDLCALC, TRIG, CHOLHDL, LDLDIRECT in the last 72 hours. Thyroid function studies No results for input(s): TSH, T4TOTAL, T3FREE, THYROIDAB in the last 72 hours.  Invalid input(s): FREET3 Anemia work up No results for input(s): VITAMINB12, FOLATE, FERRITIN, TIBC, IRON, RETICCTPCT in the last 72 hours. Urinalysis    Component Value Date/Time   COLORURINE AMBER (A) 12/07/2020 1122   APPEARANCEUR HAZY (A) 12/07/2020 1122   LABSPEC 1.023 12/07/2020 1122   PHURINE 7.0 12/07/2020 1122   GLUCOSEU NEGATIVE 12/07/2020 1122   HGBUR MODERATE (A) 12/07/2020 1122   BILIRUBINUR NEGATIVE 12/07/2020 1122   KETONESUR 5 (A) 12/07/2020 1122   PROTEINUR 100 (A) 12/07/2020 1122   UROBILINOGEN 1.0  02/16/2015 1231   NITRITE NEGATIVE 12/07/2020 1122   LEUKOCYTESUR NEGATIVE 12/07/2020 1122   Sepsis Labs Invalid input(s): PROCALCITONIN,  WBC,  LACTICIDVEN Microbiology Recent Results (from the past 240 hour(s))  Resp Panel by RT-PCR (Flu A&B, Covid) Nasopharyngeal Swab     Status: None   Collection Time: 01/21/21 12:38 AM   Specimen: Nasopharyngeal Swab; Nasopharyngeal(NP) swabs in vial transport medium  Result Value Ref Range Status   SARS Coronavirus 2 by RT PCR NEGATIVE NEGATIVE Final    Comment: (NOTE) SARS-CoV-2 target nucleic acids are NOT DETECTED.  The SARS-CoV-2 RNA is generally detectable in upper respiratory specimens during the acute phase of infection. The lowest concentration of SARS-CoV-2 viral copies this assay can  detect is 138 copies/mL. A negative result does not preclude SARS-Cov-2 infection and should not be used as the sole basis for treatment or other patient management decisions. A negative result may occur with  improper specimen collection/handling, submission of specimen other than nasopharyngeal swab, presence of viral mutation(s) within the areas targeted by this assay, and inadequate number of viral copies(<138 copies/mL). A negative result must be combined with clinical observations, patient history, and epidemiological information. The expected result is Negative.  Fact Sheet for Patients:  BloggerCourse.com  Fact Sheet for Healthcare Providers:  SeriousBroker.it  This test is no t yet approved or cleared by the [place] FDA and  has been authorized for detection and/or diagnosis of SARS-CoV-2 by FDA under an Emergency Use Authorization (EUA). This EUA will remain  in effect (meaning this test can be used) for the duration of the COVID-19 declaration under Section 564(b)(1) of the Act, 21 U.S.C.section 360bbb-3(b)(1), unless the authorization is terminated  or revoked sooner.        Influenza A by PCR NEGATIVE NEGATIVE Final   Influenza B by PCR NEGATIVE NEGATIVE Final    Comment: (NOTE) The Xpert Xpress SARS-CoV-2/FLU/RSV plus assay is intended as an aid in the diagnosis of influenza from Nasopharyngeal swab specimens and should not be used as a sole basis for treatment. Nasal washings and aspirates are unacceptable for Xpert Xpress SARS-CoV-2/FLU/RSV testing.  Fact Sheet for Patients: BloggerCourse.com  Fact Sheet for Healthcare Providers: SeriousBroker.it  This test is not yet approved or cleared by the [place] FDA and has been authorized for detection and/or diagnosis of SARS-CoV-2 by FDA under an Emergency Use Authorization (EUA). This EUA will remain in effect (meaning this test can be used) for the duration of the COVID-19 declaration under Section 564(b)(1) of the Act, 21 U.S.C. section 360bbb-3(b)(1), unless the authorization is terminated or revoked.  Performed at [facility], [address], [place], [place] [address]      Time coordinating discharge: Over 30 minutes  SIGNED:   Marinda Elk, MD  [facility] 01/22/2021, 7:57 AM Pager   If 7PM-7AM, please contact night-coverage [email or web address] Password [email or web address]

## 2021-01-22 NOTE — ED Notes (Signed)
Provider at bedside. Will discharge patient.

## 2021-01-22 NOTE — ED Notes (Signed)
Provider at bedside

## 2021-01-27 ENCOUNTER — Other Ambulatory Visit: Payer: Self-pay

## 2021-01-28 ENCOUNTER — Other Ambulatory Visit: Payer: Self-pay

## 2021-01-30 ENCOUNTER — Telehealth: Payer: Self-pay | Admitting: Neurology

## 2021-01-30 NOTE — Telephone Encounter (Signed)
Pt called, need FLMA paperwork filled out for employer. Would like a call from Medical Records to discuss filling out FLMA paperwork.

## 2021-02-02 NOTE — Telephone Encounter (Signed)
Pt has called stating that before the message sent on 10-14 she has made other attempts to speak with MR re: FMLA that is needed for her job, please call pt.

## 2021-02-04 ENCOUNTER — Telehealth: Payer: Self-pay | Admitting: *Deleted

## 2021-02-04 NOTE — Telephone Encounter (Signed)
Pt called she will be emailing a FMLA form. One day this week.

## 2021-02-17 ENCOUNTER — Other Ambulatory Visit: Payer: Self-pay

## 2021-02-17 ENCOUNTER — Other Ambulatory Visit: Payer: Self-pay | Admitting: Neurology

## 2021-02-17 ENCOUNTER — Other Ambulatory Visit: Payer: Self-pay | Admitting: Family Medicine

## 2021-02-17 MED ORDER — METOPROLOL SUCCINATE ER 50 MG PO TB24: 50.0000 mg | ORAL_TABLET | Freq: Every day | ORAL | 0 refills | Status: DC

## 2021-02-17 MED ORDER — LEVETIRACETAM 750 MG PO TABS: 750.0000 mg | ORAL_TABLET | Freq: Two times a day (BID) | ORAL | 3 refills | Status: DC

## 2021-02-18 NOTE — Telephone Encounter (Signed)
Tried calling pt, mailbox full, unable to LVM. We have not rx'd rx gabapentin since 2020. I spoke w/ Dr. Epimenio Foot. If she has been taking as rx'd: 300mg  (3 caps po BID) ok with refilling. Need to confirm w/ pt first.

## 2021-03-16 NOTE — Progress Notes (Deleted)
Office Visit    Patient Name: [name] Date of Encounter: 03/16/2021  PCP:  [name], MD   [facility]  Cardiologist:  Charlton Haws, MD  Advanced Practice Provider:  No care team member to display Electrophysiologist:  None      Chief Complaint    Kara [name] is a 35 y.o. female with a hx of seizure, palpitatons, syncope, disatolic heart failure, prior alcohol use presents today for ***   Past Medical History    Past Medical History:  Diagnosis Date   Alcoholism in remission (HCC)    per pt in remission since 2017   Anemia    AR (allergic rhinitis)    allergist-- dr Lucie Leather   Biliary dyskinesia    Diastolic CHF (HCC)    followd by dr Demetrius Charity. Eden Emms   Environmental allergies    GERD (gastroesophageal reflux disease)    Headache    History of acute pancreatitis    alcoholic pancreatitis ,  2016 and 2017   History of sepsis 06/12/2016   with CAP   History of syncope    History of thrombosis 01/29/2015   splenic vein thrombosis-- treated with coumadin--- readmitted for abd. hematoma due to coumadin treated with IR embolization of the IMA branches   Hypertension    Polyneuropathy    Prolonged QT syndrome    cardiologist-- dr Eden Emms (notes in epic)   Severe persistent asthma    pulmonologist-- dr dr Belva Crome (notes in epic)   Past Surgical History:  Procedure Laterality Date   CHOLECYSTECTOMY N/A 06/21/2018   Procedure: LAPAROSCOPIC CHOLECYSTECTOMY;  Surgeon: Abigail Miyamoto, MD;  Location: [facility];  Service: General;  Laterality: N/A;   ESOPHAGOGASTRODUODENOSCOPY N/A 07/17/2014   Procedure: ESOPHAGOGASTRODUODENOSCOPY (EGD);  Surgeon: Dorena Cookey, MD;  Location: [facility];  Service: Endoscopy;  Laterality: N/A;   TYMPANOSTOMY TUBE PLACEMENT Bilateral 11 MONTHS OLD   VIDEO BRONCHOSCOPY Bilateral 10/08/2016   Procedure: VIDEO BRONCHOSCOPY WITH FLUORO;  Surgeon: Chilton Greathouse, MD;  Location: [facility];  Service: Cardiopulmonary;   Laterality: Bilateral;    Allergies  Allergies  Allergen Reactions   Acetaminophen Other (See Comments)    Gi Upset   Nsaids Other (See Comments)    Upset stomach    Peanuts [Peanut Oil] Hives and Itching    "ONLY TREE NUTS"   Triamterene-Hctz Nausea Only    dizzy    History of Present Illness    Kara [name] is a 35 y.o. female with a hx of seizure, palpitatons, syncope, disatolic heart failure, prior alcohol use last seen 03/2018 by Dr. Eden Emms.  Initially evaluated in 2015 by Dr. Eden Emms. Echo 05/2014 normal LVEF 55-60%. Evet monitor with sinus tachycardia and started on beta blocker. Admitted 02/2015 due to alcoholic pancreatitis, splenic vein thrombosis on anticoagulation. She had psudocysts and intraabdominal hemorrhage requiring IR guided embolization of the IMA branches. She had normal exercise tolerance test 04/2015. Echocardiogram 05/2016 normal LVEF 60-65%, no RWMA, normal diastolic function.   When last seen 03/2018 by Dr. Eden Emms she was nearly 2 years sober and working for [employer].   Since last seen she has been diagnosed with generalized tonic-clonic seizures and is following with neurology.   She presents today for follow up. ***  EKGs/Labs/Other Studies Reviewed:   The following studies were reviewed today:  Echo 05/2016 Left ventricle: The cavity size was normal. Systolic function was    normal. The estimated ejection fraction was in the range of 60%  to 65%. Wall motion was normal; there were no regional wall    motion abnormalities. Left ventricular diastolic function    parameters were normal.  - Tricuspid valve: There was trivial regurgitation.  - Pulmonary arteries: Systolic pressure could not be accurately    estimated.   EKG:  EKG is  ordered today.  The ekg ordered today demonstrates ***  Recent Labs: 12/07/2020: B Natriuretic Peptide 132.9 01/21/2021: ALT 14; BUN 5; Creatinine, Ser 0.68; Hemoglobin 10.8; Magnesium 1.4; Magnesium 1.4; Platelets 141;  Potassium 3.6; Sodium 137  Recent Lipid Panel    Component Value Date/Time   CHOL 174 09/09/2015 1724   TRIG 112 10/17/2019 0628   HDL 57 09/09/2015 1724   CHOLHDL 3.1 09/09/2015 1724   VLDL 17 09/09/2015 1724   LDLCALC 100 (H) 09/09/2015 1724   Home Medications   No outpatient medications have been marked as taking for the 03/17/21 encounter (Appointment) with Alver Sorrow, NP.     Review of Systems      All other systems reviewed and are otherwise negative except as noted above.  Physical Exam    VS:  There were no vitals taken for this visit. , BMI There is no height or weight on file to calculate BMI.  Wt Readings from Last 3 Encounters:  01/20/21 199 lb 8 oz (90.5 kg)  12/07/20 189 lb 9.5 oz (86 kg)  08/05/20 190 lb (86.2 kg)     GEN: Well nourished, well developed, in no acute distress. HEENT: normal. Neck: Supple, no JVD, carotid bruits, or masses. Cardiac: ***RRR, no murmurs, rubs, or gallops. No clubbing, cyanosis, edema.  ***Radials/PT 2+ and equal bilaterally.  Respiratory:  ***Respirations regular and unlabored, clear to auscultation bilaterally. GI: Soft, nontender, nondistended. MS: No deformity or atrophy. Skin: Warm and dry, no rash. Neuro:  Strength and sensation are intact. Psych: Normal affect.  Assessment & Plan    *** Syncope -   Etoh abuse -   Hyperlipidemia -   Ashtma - Follows with pulmonology.   GERD -   Seizure - Following with neurology.   Palpitations -   Disposition: Follow up {follow up:15908} with Charlton Haws, MD or APP.  Signed, Alver Sorrow, NP 03/16/2021, 3:50 PM  [facility]

## 2021-03-17 ENCOUNTER — Ambulatory Visit (HOSPITAL_BASED_OUTPATIENT_CLINIC_OR_DEPARTMENT_OTHER): Payer: BC Managed Care – PPO | Admitting: Family

## 2021-03-27 ENCOUNTER — Other Ambulatory Visit: Payer: Self-pay

## 2021-03-27 ENCOUNTER — Encounter (HOSPITAL_COMMUNITY): Payer: Self-pay | Admitting: Emergency Medicine

## 2021-03-27 ENCOUNTER — Emergency Department (HOSPITAL_COMMUNITY)
Admission: EM | Admit: 2021-03-27 | Discharge: 2021-03-27 | Disposition: A | Discharge status: Home / Self Care | Payer: BC Managed Care – PPO | Source: Home / Self Care

## 2021-03-27 ENCOUNTER — Emergency Department (HOSPITAL_COMMUNITY)
Admission: EM | Admit: 2021-03-27 | Discharge: 2021-03-27 | Disposition: A | Discharge status: Home / Self Care | Payer: BC Managed Care – PPO | Attending: Emergency Medicine | Admitting: Emergency Medicine

## 2021-03-27 DIAGNOSIS — Z5321 Procedure and treatment not carried out due to patient leaving prior to being seen by health care provider: Secondary | ICD-10-CM | POA: Insufficient documentation

## 2021-03-27 DIAGNOSIS — R569 Unspecified convulsions: Secondary | ICD-10-CM | POA: Insufficient documentation

## 2021-03-27 LAB — CBG MONITORING, ED: Glucose-Capillary: 119 mg/dL — ABNORMAL HIGH (ref 70–99)

## 2021-03-27 NOTE — ED Notes (Signed)
During the medical screening exam, the patient became irate that we did not have a room available. She requested her bracelet to be removed and said that she would be leaving.

## 2021-03-27 NOTE — ED Triage Notes (Signed)
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 Multiple self reported seizures since 1700. No postictal state noted per EMS.   20g R hand

## 2021-03-27 NOTE — ED Provider Notes (Signed)
Emergency Medicine Provider Triage Evaluation Note  Kara [name] , a 35 y.o. female  was evaluated in triage.  Pt complains of numerous seizures that were witnessed today.  Patient was recently admitted for similar at [facility].  Had MRI and CTs which were negative.  No nausea, vomiting, diarrhea, bowel incontinence, bladder incontinence.  She did bite her inner cheek.  No injuries.  She is taking Keppra and has not missed any doses.  Review of Systems  Positive:  Negative: See above   Physical Exam  BP (!) 124/93   Pulse 89   Temp 98 F (36.7 C)   Resp 16   SpO2 96%  Gen:   Awake, no distress   Resp:  Normal effort  MSK:   Moves extremities without difficulty  Other:  Cranial nerves II through XII are intact.  5/5 strength the upper and lower extremities.  Normal sensation the upper lower extremities.  Medical Decision Making  Medically screening exam initiated at 9:46 PM.  Appropriate orders placed.  Kara [name] was informed that the remainder of the evaluation will be completed by another provider, this initial triage assessment does not replace that evaluation, and the importance of remaining in the ED until their evaluation is complete.  Patient had an episode during my exam were she was staring off into space.  No obvious clonic tonic movement.  This lasted roughly 30 to 45 seconds.  She immediately returned to her baseline without any significant postictal confusion.  She did asked me "what happened?"   Kara [name] 03/27/21 2152    Linwood Dibbles, MD 03/27/21 [phone]

## 2021-03-27 NOTE — ED Notes (Addendum)
PT informed this Clinical research associate of intent to leave. PT refused to have her CBG taken. This Clinical research associate removed Pt's IV before pt left premises.

## 2021-03-27 NOTE — ED Provider Notes (Signed)
Emergency Medicine Provider Triage Evaluation Note  Kara [name] , a 35 y.o. female  was evaluated in triage.  Pt complains of seizures.  States that she has a history of seizures.  Reports that she had 8-9 seizures today.  Reports that her seizures consist of "staring into space."  Reports that she had a seizure just prior to arrival in emergency department.  Patient states that she has been taking her prescribed Keppra medication.  Review of Systems  Positive: Seizures Negative:   Physical Exam  BP 121/90 (BP Location: Left Arm)   Pulse 83   Temp 97.8 F (36.6 C) (Oral)   Resp 16   Ht 5\' 5"  (1.651 m)   Wt 90.3 kg   SpO2 100%   BMI 33.12 kg/m  Gen:   Awake, no distress   Resp:  Normal effort  MSK:   Moves extremities without difficulty  Other:    Medical Decision Making  Medically screening exam initiated at 11:03 PM.  Appropriate orders placed.  Kara [name] was informed that the remainder of the evaluation will be completed by another provider, this initial triage assessment does not replace that evaluation, and the importance of remaining in the ED until their evaluation is complete.  Per chart review patient was seen for MSE earlier this evening.  Patient left prior to being seen by provider.  Patient is requesting that she be moved back to her room immediately.  Advised that there is no beds at this time and that work-up would be initiated and she would be moved back to room as soon as possible.  Patient becomes agitated and threatens to leave.  Advised that leaving prior to full assessment by medical provider would be AGAINST MEDICAL ADVICE.     Kara [name] 03/27/21 2306    [id number], MD 03/30/21 [phone]

## 2021-03-27 NOTE — ED Triage Notes (Signed)
PT came EMS and was triaged at that time. Expressed intent to leave and has returned to be seen. Reports multiple seizures today.

## 2021-04-23 ENCOUNTER — Encounter: Payer: Self-pay | Admitting: Neurology

## 2021-04-23 ENCOUNTER — Ambulatory Visit: Payer: BC Managed Care – PPO | Admitting: Neurology

## 2021-04-23 VITALS — BP 126/86 | HR 86 | Ht 65.0 in | Wt 217.0 lb

## 2021-04-23 DIAGNOSIS — F32A Depression, unspecified: Secondary | ICD-10-CM

## 2021-04-23 DIAGNOSIS — G40409 Other generalized epilepsy and epileptic syndromes, not intractable, without status epilepticus: Secondary | ICD-10-CM | POA: Diagnosis not present

## 2021-04-23 DIAGNOSIS — G629 Polyneuropathy, unspecified: Secondary | ICD-10-CM | POA: Diagnosis not present

## 2021-04-23 MED ORDER — SERTRALINE HCL 100 MG PO TABS
100.0000 mg | ORAL_TABLET | Freq: Every day | ORAL | 3 refills | Status: DC
Start: 2021-04-23 — End: 2021-12-15

## 2021-04-23 MED ORDER — TOPIRAMATE 100 MG PO TABS: 100.0000 mg | ORAL_TABLET | Freq: Two times a day (BID) | ORAL | 3 refills | Status: DC

## 2021-04-23 MED ORDER — OXCARBAZEPINE 300 MG PO TABS: ORAL_TABLET | ORAL | 11 refills | Status: DC

## 2021-04-23 NOTE — Progress Notes (Signed)
[facility]  PATIENT: Kara [name] DOB: 06/28/1985  REFERRING DOCTOR OR PCP:  Gildardo Crankerharles Ross, MD SOURCE: patient, notes from primary care, notes from neurology 2018/2019, imaging from 2016.  _________________________________   HISTORICAL  CHIEF COMPLAINT:  Chief Complaint  Patient presents with   Follow-up    Pt with mom, rm 1. She has had seizures since being here. Last seizure she went to ER 03/27/2021. Last known seizure end of Dec. [place]. Mom states they are different kinds of seizures.     HISTORY OF PRESENT ILLNESS:  Kara LewAmanda Perkin, at [facility] for [facility] regarding seizures.  Update 04/23/2021 She is on Keppra 750 mg po bid for seizures.     She does feel more tired on it.   She had a seizure last week.  She was sitting , told her mom she couldn't focus and then started swaying and lost consciousness.     She had a couple seizures one day  She is on Wellbutrin and has been on it x 3 years.   We discussed it may lower the seizure threshold and I would like her to stop   Seizure history: In September 2022, she was watching TV in bed and next thing she remembers is her husband yelling "Are you ok are you ok".   He witnessed her having a generalized tonic clonic seizure lasting 30 seconds   Arms clenched up and then she started to shake.   She bit the the inside of her mouth.   She had no incontinence.  She was back to baseline fairly quickly.   Her husband told her later that she had a couple staring spells while eating a couple days earlier.   The 24 hours before the event were fairly normal.   No alcohol or drug use.  She thinks she had slept well the previous night.   No history of seizures prior to this event.  She used to drink daily but is sober x 4 years.   She has no FH of seizures.   Her paternal aunt had MS.      Due to the above event she was referred on 01/20/2021. While being examined, she  had a seizure witnessed by me.  She had just stood up for me to assess her gait.  She took a few steps.  She then became rigid and fell backwards against the wall.     She then had 30 seconds of bilateral symmetric clonic activity.  She then became still and was unresponsive for about 1 minute and then was responsive to localize sound.  She began to speak about 3 to 4 minutes later.  About 10 minutes after the seizure she was able to answer detailed questions.    She does not have memory of the event.   [facility]  EEG later that day was normal.  MRI of the brain was normal.  IMAGING REVIEWED CT Head 12/07/2020 was normal.     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 There is right greater than left foraminal narrowing. There is no spinal cord abnormality noted.     MRI of the  brain 07/27/2014 was normal.     MRI of the brain 01/20/2021 was normal.       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    REVIEW OF SYSTEMS: Constitutional: No fevers, chills,  sweats, or change in appetite Eyes: No visual changes, double vision, eye pain Ear, nose and throat: No hearing loss, ear pain, nasal congestion, sore throat Cardiovascular: No chest pain, palpitations Respiratory:  No shortness of breath at rest or with exertion.   No wheezes GastrointestinaI: No nausea, vomiting, diarrhea, abdominal pain, fecal incontinence Genitourinary:  No dysuria, urinary retention or frequency.  No nocturia. Musculoskeletal:  No neck pain, back pain Integumentary: No rash, pruritus, skin lesions Neurological: as above Psychiatric: She has depression Endocrine: No palpitations, diaphoresis, change in appetite, change in weigh or increased thirst Hematologic/Lymphatic:  No anemia, purpura, petechiae. Allergic/Immunologic: No itchy/runny eyes, nasal congestion, recent allergic reactions, rashes  ALLERGIES: Allergies  Allergen  Reactions   Acetaminophen Other (See Comments)    Gi Upset   Nsaids Other (See Comments)    Upset stomach    Peanuts [Peanut Oil] Hives and Itching    "ONLY TREE NUTS"   Triamterene-Hctz Nausea Only    dizzy    HOME MEDICATIONS:  Current Outpatient Medications:    albuterol (VENTOLIN HFA) 108 (90 Base) MCG/ACT inhaler, 2 puffs every 4 hours as needed for coughing or wheezing. (Patient taking differently: Inhale 2 puffs into the lungs every 6 (six) hours as needed for wheezing or shortness of breath. 2 puffs every 4 hours as needed for coughing or wheezing.), Disp: 18 g, Rfl: 1   BREO ELLIPTA 200-25 MCG/INH AEPB, INHALE 1 PUFF INTO THE LUNGS DAILY (Patient taking differently: Inhale 1 puff into the lungs daily as needed (shortness of breath/wheezing).), Disp: 180 each, Rfl: 1   Cholecalciferol (VITAMIN D PO), Take 1,000 Units by mouth daily., Disp: , Rfl:    DUPIXENT 300 MG/2ML prefilled syringe, INJECT 300 MG SUBCUTANEOUSLY EVERY OTHER WEEK (Patient taking differently: Inject 300 mg into the skin every 14 (fourteen) days.), Disp: 4 mL, Rfl: 11   EPINEPHrine (EPIPEN 2-PAK) 0.3 mg/0.3 mL IJ SOAJ injection, Inject 0.3 mLs (0.3 mg total) into the muscle once as needed (for severe allergic reaction)., Disp: 2 Device, Rfl: 1   gabapentin (NEURONTIN) 300 MG capsule, TAKE 3 CAPSULES BY MOUTH TWICE DAILY (Patient taking differently: Take 900 mg by mouth 2 (two) times daily.), Disp: 180 capsule, Rfl: 0   levETIRAcetam (KEPPRA) 750 MG tablet, Take 1 tablet (750 mg total) by mouth 2 (two) times daily., Disp: 180 tablet, Rfl: 3   lipase/protease/amylase (CREON) 36000 UNITS CPEP capsule, Take 72,000 Units by mouth 3 (three) times daily with meals., Disp: , Rfl:    magnesium oxide (MAGOX 400) 400 (240 Mg) MG tablet, Take 1 tablet (400 mg total) by mouth daily., Disp: 30 tablet, Rfl: 0   Melatonin 10 MG TABS, Take 10 mg by mouth at bedtime., Disp: , Rfl:    metoprolol succinate (TOPROL-XL) 50 MG 24 hr  tablet, Take 1 tablet (50 mg total) by mouth daily. Pt needs to keep upcoming appt in Nov for 90 day refills, Disp: 30 tablet, Rfl: 0   montelukast (SINGULAIR) 10 MG tablet, TAKE ONE TABLET BY MOUTH AT BEDTIME (Patient taking differently: Take 10 mg by mouth at bedtime.), Disp: 30 tablet, Rfl: 0   Multiple Vitamins-Minerals (MULTIVITAMIN WITH MINERALS) tablet, Take 1 tablet by mouth daily., Disp: , Rfl:    naloxone (NARCAN) nasal spray 4 mg/0.1 mL, SMARTSIG:Both Nares, Disp: , Rfl:    omeprazole (PRILOSEC) 40 MG capsule, TAKE 1 CAPSULE(40 MG) BY MOUTH DAILY (Patient taking differently: Take 40 mg by mouth at bedtime.), Disp: 30 capsule, Rfl: 0  ondansetron (ZOFRAN-ODT) 8 MG disintegrating tablet, Take 8 mg by mouth 3 (three) times daily as needed., Disp: , Rfl:    oxyCODONE (ROXICODONE) 15 MG immediate release tablet, Take 15 mg by mouth 4 (four) times daily as needed for pain., Disp: , Rfl:    polyethylene glycol (MIRALAX / GLYCOLAX) 17 g packet, Take 17 g by mouth daily as needed for mild constipation or moderate constipation., Disp: 14 each, Rfl: 0   QUEtiapine (SEROQUEL) 100 MG tablet, Take 100 mg by mouth at bedtime., Disp: , Rfl:    rosuvastatin (CRESTOR) 10 MG tablet, Take 10 mg by mouth daily., Disp: , Rfl:    spironolactone (ALDACTONE) 25 MG tablet, Take 25 mg by mouth daily as needed (leg swelling)., Disp: , Rfl:    topiramate (TOPAMAX) 100 MG tablet, Take 1 tablet (100 mg total) by mouth 2 (two) times daily., Disp: 180 tablet, Rfl: 3   sertraline (ZOLOFT) 100 MG tablet, Take 1 tablet (100 mg total) by mouth daily., Disp: 90 tablet, Rfl: 3  PAST MEDICAL HISTORY: Past Medical History:  Diagnosis Date   Alcoholism in remission (HCC)    per pt in remission since 2017   Anemia    AR (allergic rhinitis)    allergist-- dr Lucie Leather   Biliary dyskinesia    Diastolic CHF (HCC)    followd by dr Demetrius Charity. [facility]   Environmental allergies    GERD (gastroesophageal reflux disease)    Headache     History of acute pancreatitis    alcoholic pancreatitis ,  2016 and 2017   History of sepsis 06/12/2016   with CAP   History of syncope    History of thrombosis 01/29/2015   splenic vein thrombosis-- treated with coumadin--- readmitted for abd. hematoma due to coumadin treated with IR embolization of the IMA branches   Hypertension    Polyneuropathy    Prolonged QT syndrome    cardiologist-- dr Eden Emms (notes in epic)   Severe persistent asthma    pulmonologist-- dr dr Belva Crome (notes in epic)    PAST SURGICAL HISTORY: Past Surgical History:  Procedure Laterality Date   CHOLECYSTECTOMY N/A 06/21/2018   Procedure: LAPAROSCOPIC CHOLECYSTECTOMY;  Surgeon: Abigail Miyamoto, MD;  Location: [facility];  Service: General;  Laterality: N/A;   ESOPHAGOGASTRODUODENOSCOPY N/A 07/17/2014   Procedure: ESOPHAGOGASTRODUODENOSCOPY (EGD);  Surgeon: Dorena Cookey, MD;  Location: [facility];  Service: Endoscopy;  Laterality: N/A;   TYMPANOSTOMY TUBE PLACEMENT Bilateral 11 MONTHS OLD   VIDEO BRONCHOSCOPY Bilateral 10/08/2016   Procedure: VIDEO BRONCHOSCOPY WITH FLUORO;  Surgeon: Chilton Greathouse, MD;  Location: [facility];  Service: Cardiopulmonary;  Laterality: Bilateral;    FAMILY HISTORY: Family History  Problem Relation Age of Onset   Heart failure Mother    Colon polyps Mother    Heart failure Father    Diabetes Father    Heart failure Maternal Grandfather    Colon cancer Neg Hx     SOCIAL HISTORY:  Social History   Socioeconomic History   Marital status: Married    Spouse name: [name]   Number of children: 0   Years of education: Not on file   Highest education level: Not on file  Occupational History   Occupation: Engineer, production  Tobacco Use   Smoking status: Every Day    Packs/day: 0.25    Years: 5.00    Pack years: 1.25    Types: Cigarettes   Smokeless tobacco: Never  Vaping Use   Vaping Use: Never used  Substance  and Sexual Activity   Alcohol use: Not Currently     Alcohol/week: 0.0 standard drinks    Comment: pt hx alcohol abuse , in remission since 2017   Drug use: Not Currently    Comment: "POT WHEN I WAS IN HIGH SCHOOL"   Sexual activity: Yes    Birth control/protection: None  Other Topics Concern   Not on file  Social History Narrative   Married.   Social Determinants of Health   Financial Resource Strain: Not on file  Food Insecurity: Not on file  Transportation Needs: Not on file  Physical Activity: Not on file  Stress: Not on file  Social Connections: Not on file  Intimate Partner Violence: Not on file     PHYSICAL EXAM  Vitals:   04/23/21 1547  BP: 126/86  Pulse: 86  Weight: 217 lb (98.4 kg)  Height: 5\' 5"  (1.651 m)    Body mass index is 36.11 kg/m.   General: The patient is well-developed and well-nourished and in no acute distress  HEENT:  Head is [place]/AT.  Sclera are anicteric.  Skin: Extremities are without rash or  edema.  Neurologic Exam  Mental status: The patient is alert and oriented x 3 at the time of the examination. The patient has apparent normal recent and remote memory, with an apparently normal attention span and concentration ability.   Speech is normal.  Cranial nerves: Extraocular movements are full.  Facial strength and sensation was normal.. No obvious hearing deficits are noted.  Motor:  Muscle bulk is normal.   Tone is normal. Strength is  5 / 5 in all 4 extremities.   Sensory: Sensory testing is intact to pinprick, soft touch and vibration sensation in arms.  Reduced vibration sensation in toes..  Coordination: Cerebellar testing reveals good finger-nose-finger and heel-to-shin bilaterally.  Gait and station: Station is normal.   Gait and tandem gait were normal.  Reflexes: Deep tendon reflexes are symmetric and normal bilaterally.     DIAGNOSTIC DATA (LABS, IMAGING, TESTING) - I reviewed patient records, labs, notes, testing and imaging myself where available.  Lab Results   Component Value Date   WBC 5.2 01/21/2021   HGB 10.8 (L) 01/21/2021   HCT 31.6 (L) 01/21/2021   MCV 93.8 01/21/2021   PLT 141 (L) 01/21/2021      Component Value Date/Time   NA 137 01/21/2021 0732   NA 139 10/15/2016 0825   K 3.6 01/21/2021 0732   CL 103 01/21/2021 0732   CO2 25 01/21/2021 0732   GLUCOSE 95 01/21/2021 0732   BUN 5 (L) 01/21/2021 0732   BUN 11 10/15/2016 0825   CREATININE 0.68 01/21/2021 0732   CALCIUM 8.4 (L) 01/21/2021 0732   PROT 5.6 (L) 01/21/2021 0732   PROT 7.6 09/02/2014 1507   ALBUMIN 2.9 (L) 01/21/2021 0732   ALBUMIN 4.1 09/02/2014 1507   AST 29 01/21/2021 0732   ALT 14 01/21/2021 0732   ALKPHOS 170 (H) 01/21/2021 0732   BILITOT 1.3 (H) 01/21/2021 0732   BILITOT 0.7 09/02/2014 1507   GFRNONAA >60 01/21/2021 0732   GFRAA >60 11/12/2019 0435   Lab Results  Component Value Date   CHOL 174 09/09/2015   HDL 57 09/09/2015   LDLCALC 100 (H) 09/09/2015   TRIG 112 10/17/2019   CHOLHDL 3.1 09/09/2015   Lab Results  Component Value Date   HGBA1C 5.6 08/01/2016   Lab Results  Component Value Date   VITAMINB12 632 09/02/2014   Lab Results  Component Value Date   TSH 0.219 (L) 06/13/2016       ASSESSMENT AND PLAN  Generalized tonic-clonic seizure (HCC)  Depression, unspecified depression type  Polyneuropathy   We will switch from Keppra to topiramate.  If seizures persist, I would want her to see an epileptologist.   Though the spell I witnessed in October was a GTC, she now reports some staring spells --- may need inpatient EMU to determine type of seizure and if non-epileptiform events mixed in.  EEG was negative in hospital 01/20/2021.   MRI was normal.   Stop Wellbutrin.   Increase sertraline dose. she will follow-up with [place] in 6 months or call sooner call if new or worsening neurologic symptoms   Leesha Veno A. Epimenio Foot, MD, [facility] 04/23/2021, 7:50 PM Certified in Neurology, Clinical Neurophysiology, Sleep Medicine and  Neuroimaging  [facility] [address], [address] [place], [place] [address] [phone]

## 2021-10-28 ENCOUNTER — Ambulatory Visit: Payer: BC Managed Care – PPO | Admitting: Neurology

## 2021-10-28 ENCOUNTER — Encounter: Payer: Self-pay | Admitting: Neurology

## 2021-10-28 VITALS — BP 121/79 | HR 87 | Ht 65.0 in | Wt 211.0 lb

## 2021-10-28 DIAGNOSIS — G40409 Other generalized epilepsy and epileptic syndromes, not intractable, without status epilepticus: Secondary | ICD-10-CM

## 2021-10-28 DIAGNOSIS — G629 Polyneuropathy, unspecified: Secondary | ICD-10-CM

## 2021-10-28 DIAGNOSIS — F32A Depression, unspecified: Secondary | ICD-10-CM

## 2021-10-28 MED ORDER — TOPIRAMATE 100 MG PO TABS
100.0000 mg | ORAL_TABLET | Freq: Two times a day (BID) | ORAL | 3 refills | Status: DC
Start: 2021-10-28 — End: 2021-12-15

## 2021-10-28 NOTE — Progress Notes (Signed)
[facility]  PATIENT: Kara [name] DOB: January 08, 1986  REFERRING DOCTOR OR PCP:  Gildardo Cranker, MD SOURCE: patient, notes from primary care, notes from neurology 2018/2019, imaging from 2016.  _________________________________   HISTORICAL  CHIEF COMPLAINT:  Chief Complaint  Patient presents with   Follow-up    Rm 2, alone. Here to f/u for sz. Reports doing well. No recent sz like activity since las ov.     HISTORY OF PRESENT ILLNESS:  Kara [name], at [facility] for neurologic consultation regarding seizures.  Update 10/28/2021 She is on Topamax 100 mg po bid for seizures.     She feels better on TPM than LEV .  No seizures since the switch.     She sleeps well most nights. = 8 hours most nights  She was on Wellbutrin when she had some seizures but is off as it may lower the seizure threshold and I would like her to stop   Seizure history: In September 2022, she was watching TV in bed and next thing she remembers is her husband yelling "Are you ok are you ok".   He witnessed her having a generalized tonic clonic seizure lasting 30 seconds   Arms clenched up and then she started to shake.   She bit the the inside of her mouth.   She had no incontinence.  She was back to baseline fairly quickly.   Her husband told her later that she had a couple staring spells while eating a couple days earlier.   The 24 hours before the event were fairly normal.   No alcohol or drug use.  She thinks she had slept well the previous night.   No history of seizures prior to this event.  She used to drink daily but is sober x 4 years.   She has no FH of seizures.   Her paternal aunt had MS.      Due to the above event she was referred on 01/20/2021. While being examined, she had a seizure witnessed by me.  She had just stood up for me to assess her gait.  She took a few steps.  She then became rigid and fell backwards against the wall.     She then had 30 seconds of  bilateral symmetric clonic activity.  She then became still and was unresponsive for about 1 minute and then was responsive to localize sound.  She began to speak about 3 to 4 minutes later.  About 10 minutes after the seizure she was able to answer detailed questions.    She does not have memory of the event.   She was admitted to [facility].  EEG later that day was normal.  MRI of the brain was normal.  IMAGING REVIEWED CT Head 12/07/2020 was normal.     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 There is right greater than left foraminal narrowing. There is no spinal cord abnormality noted.     MRI of the  brain 07/27/2014 was normal.     MRI of the brain 01/20/2021 was normal.       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    REVIEW OF SYSTEMS: Constitutional: No fevers, chills, sweats, or change in appetite Eyes: No visual changes, double vision, eye pain Ear, nose and throat: No hearing loss, ear pain, nasal congestion, sore throat Cardiovascular: No chest pain, palpitations Respiratory:  No shortness of breath at rest  or with exertion.   No wheezes GastrointestinaI: No nausea, vomiting, diarrhea, abdominal pain, fecal incontinence Genitourinary:  No dysuria, urinary retention or frequency.  No nocturia. Musculoskeletal:  No neck pain, back pain Integumentary: No rash, pruritus, skin lesions Neurological: as above Psychiatric: She has depression Endocrine: No palpitations, diaphoresis, change in appetite, change in weigh or increased thirst Hematologic/Lymphatic:  No anemia, purpura, petechiae. Allergic/Immunologic: No itchy/runny eyes, nasal congestion, recent allergic reactions, rashes  ALLERGIES: Allergies  Allergen Reactions   Acetaminophen Other (See Comments)    Gi Upset   Nsaids Other (See Comments)    Upset stomach    Peanuts [Peanut Oil] Hives and Itching    "ONLY TREE NUTS"   Triamterene-Hctz  Nausea Only    dizzy    HOME MEDICATIONS:  Current Outpatient Medications:    albuterol (VENTOLIN HFA) 108 (90 Base) MCG/ACT inhaler, 2 puffs every 4 hours as needed for coughing or wheezing., Disp: 18 g, Rfl: 1   BREO ELLIPTA 200-25 MCG/INH AEPB, INHALE 1 PUFF INTO THE LUNGS DAILY (Patient taking differently: Inhale 1 puff into the lungs daily as needed (shortness of breath/wheezing).), Disp: 180 each, Rfl: 1   Cholecalciferol (VITAMIN D PO), Take 1,000 Units by mouth daily., Disp: , Rfl:    DUPIXENT 300 MG/2ML prefilled syringe, INJECT 300 MG SUBCUTANEOUSLY EVERY OTHER WEEK, Disp: 4 mL, Rfl: 11   EPINEPHrine (EPIPEN 2-PAK) 0.3 mg/0.3 mL IJ SOAJ injection, Inject 0.3 mLs (0.3 mg total) into the muscle once as needed (for severe allergic reaction)., Disp: 2 Device, Rfl: 1   gabapentin (NEURONTIN) 300 MG capsule, TAKE 3 CAPSULES BY MOUTH TWICE DAILY, Disp: 180 capsule, Rfl: 0   levETIRAcetam (KEPPRA) 750 MG tablet, Take 1 tablet (750 mg total) by mouth 2 (two) times daily., Disp: 180 tablet, Rfl: 3   lipase/protease/amylase (CREON) 36000 UNITS CPEP capsule, Take 72,000 Units by mouth 3 (three) times daily with meals., Disp: , Rfl:    magnesium oxide (MAGOX 400) 400 (240 Mg) MG tablet, Take 1 tablet (400 mg total) by mouth daily., Disp: 30 tablet, Rfl: 0   Melatonin 10 MG TABS, Take 10 mg by mouth at bedtime., Disp: , Rfl:    metoprolol succinate (TOPROL-XL) 50 MG 24 hr tablet, Take 1 tablet (50 mg total) by mouth daily. Pt needs to keep upcoming appt in Nov for 90 day refills, Disp: 30 tablet, Rfl: 0   montelukast (SINGULAIR) 10 MG tablet, TAKE ONE TABLET BY MOUTH AT BEDTIME, Disp: 30 tablet, Rfl: 0   Multiple Vitamins-Minerals (MULTIVITAMIN WITH MINERALS) tablet, Take 1 tablet by mouth daily., Disp: , Rfl:    naloxone (NARCAN) nasal spray 4 mg/0.1 mL, SMARTSIG:Both Nares, Disp: , Rfl:    omeprazole (PRILOSEC) 40 MG capsule, TAKE 1 CAPSULE(40 MG) BY MOUTH DAILY, Disp: 30 capsule, Rfl: 0    ondansetron (ZOFRAN-ODT) 8 MG disintegrating tablet, Take 8 mg by mouth 3 (three) times daily as needed., Disp: , Rfl:    oxyCODONE (ROXICODONE) 15 MG immediate release tablet, Take 15 mg by mouth 4 (four) times daily as needed for pain., Disp: , Rfl:    polyethylene glycol (MIRALAX / GLYCOLAX) 17 g packet, Take 17 g by mouth daily as needed for mild constipation or moderate constipation., Disp: 14 each, Rfl: 0   QUEtiapine (SEROQUEL) 100 MG tablet, Take 100 mg by mouth at bedtime., Disp: , Rfl:    rosuvastatin (CRESTOR) 10 MG tablet, Take 10 mg by mouth daily., Disp: , Rfl:    sertraline (ZOLOFT) 100  MG tablet, Take 1 tablet (100 mg total) by mouth daily., Disp: 90 tablet, Rfl: 3   spironolactone (ALDACTONE) 25 MG tablet, Take 25 mg by mouth daily as needed (leg swelling)., Disp: , Rfl:    topiramate (TOPAMAX) 100 MG tablet, Take 1 tablet (100 mg total) by mouth 2 (two) times daily., Disp: 180 tablet, Rfl: 3  PAST MEDICAL HISTORY: Past Medical History:  Diagnosis Date   Alcoholism in remission (HCC)    per pt in remission since 2017   Anemia    AR (allergic rhinitis)    allergist-- dr Lucie Leather   Biliary dyskinesia    Diastolic CHF (HCC)    followd by dr Demetrius Charity. [facility]   Environmental allergies    GERD (gastroesophageal reflux disease)    Headache    History of acute pancreatitis    alcoholic pancreatitis ,  2016 and 2017   History of sepsis 06/12/2016   with CAP   History of syncope    History of thrombosis 01/29/2015   splenic vein thrombosis-- treated with coumadin--- readmitted for abd. hematoma due to coumadin treated with IR embolization of the IMA branches   Hypertension    Polyneuropathy    Prolonged QT syndrome    cardiologist-- dr Eden Emms (notes in epic)   Severe persistent asthma    pulmonologist-- dr dr Belva Crome (notes in epic)    PAST SURGICAL HISTORY: Past Surgical History:  Procedure Laterality Date   CHOLECYSTECTOMY N/A 06/21/2018   Procedure: LAPAROSCOPIC  CHOLECYSTECTOMY;  Surgeon: Abigail Miyamoto, MD;  Location: [facility];  Service: General;  Laterality: N/A;   ESOPHAGOGASTRODUODENOSCOPY N/A 07/17/2014   Procedure: ESOPHAGOGASTRODUODENOSCOPY (EGD);  Surgeon: Dorena Cookey, MD;  Location: [facility];  Service: Endoscopy;  Laterality: N/A;   TYMPANOSTOMY TUBE PLACEMENT Bilateral 11 MONTHS OLD   VIDEO BRONCHOSCOPY Bilateral 10/08/2016   Procedure: VIDEO BRONCHOSCOPY WITH FLUORO;  Surgeon: Chilton Greathouse, MD;  Location: [facility];  Service: Cardiopulmonary;  Laterality: Bilateral;    FAMILY HISTORY: Family History  Problem Relation Age of Onset   Heart failure Mother    Colon polyps Mother    Heart failure Father    Diabetes Father    Heart failure Maternal Grandfather    Colon cancer Neg Hx     SOCIAL HISTORY:  Social History   Socioeconomic History   Marital status: Married    Spouse name: [name]   Number of children: 0   Years of education: Not on file   Highest education level: Not on file  Occupational History   Occupation: Engineer, production  Tobacco Use   Smoking status: Every Day    Packs/day: 0.25    Years: 5.00    Total pack years: 1.25    Types: Cigarettes   Smokeless tobacco: Never  Vaping Use   Vaping Use: Never used  Substance and Sexual Activity   Alcohol use: Not Currently    Alcohol/week: 0.0 standard drinks of alcohol    Comment: pt hx alcohol abuse , in remission since 2017   Drug use: Not Currently    Comment: "POT WHEN I WAS IN HIGH SCHOOL"   Sexual activity: Yes    Birth control/protection: None  Other Topics Concern   Not on file  Social History Narrative   Married.   Social Determinants of Health   Financial Resource Strain: Not on file  Food Insecurity: Not on file  Transportation Needs: Not on file  Physical Activity: Not on file  Stress: Not on file  Social Connections:  Not on file  Intimate Partner Violence: Not on file     PHYSICAL EXAM  Vitals:   10/28/21 1602  BP: 121/79   Pulse: 87  Weight: 211 lb (95.7 kg)  Height: 5\' 5"  (1.651 m)    Body mass index is 35.11 kg/m.   General: The patient is well-developed and well-nourished and in no acute distress  HEENT:  Head is [place]/AT.  Sclera are anicteric.  Skin: Extremities are without rash or  edema.  Neurologic Exam  Mental status: The patient is alert and oriented x 3 at the time of the examination. The patient has apparent normal recent and remote memory, with an apparently normal attention span and concentration ability.   Speech is normal.  Cranial nerves: Extraocular movements are full.  Facial strength and sensation was normal.. No obvious hearing deficits are noted.  Motor:  Muscle bulk is normal.   Tone is normal. Strength is  5 / 5 in all 4 extremities.   Sensory: Intact vibration in arms and legs  Coordination: Cerebellar testing reveals good finger-nose-finger and heel-to-shin bilaterally.  Gait and station: Station is normal.   Gait and tandem gait were normal.  Reflexes: Deep tendon reflexes are symmetric and normal bilaterally.     DIAGNOSTIC DATA (LABS, IMAGING, TESTING) - I reviewed patient records, labs, notes, testing and imaging myself where available.  Lab Results  Component Value Date   WBC 5.2 01/21/2021   HGB 10.8 (L) 01/21/2021   HCT 31.6 (L) 01/21/2021   MCV 93.8 01/21/2021   PLT 141 (L) 01/21/2021      Component Value Date/Time   NA 137 01/21/2021 0732   NA 139 10/15/2016 0825   K 3.6 01/21/2021 0732   CL 103 01/21/2021 0732   CO2 25 01/21/2021 0732   GLUCOSE 95 01/21/2021 0732   BUN 5 (L) 01/21/2021 0732   BUN 11 10/15/2016 0825   CREATININE 0.68 01/21/2021 0732   CALCIUM 8.4 (L) 01/21/2021 0732   PROT 5.6 (L) 01/21/2021 0732   PROT 7.6 09/02/2014 1507   ALBUMIN 2.9 (L) 01/21/2021 0732   ALBUMIN 4.1 09/02/2014 1507   AST 29 01/21/2021 0732   ALT 14 01/21/2021 0732   ALKPHOS 170 (H) 01/21/2021 0732   BILITOT 1.3 (H) 01/21/2021 0732   BILITOT 0.7  09/02/2014 1507   GFRNONAA >60 01/21/2021 0732   GFRAA >60 11/12/2019 0435   Lab Results  Component Value Date   CHOL 174 09/09/2015   HDL 57 09/09/2015   LDLCALC 100 (H) 09/09/2015   TRIG 112 10/17/2019   CHOLHDL 3.1 09/09/2015   Lab Results  Component Value Date   HGBA1C 5.6 08/01/2016   Lab Results  Component Value Date   VITAMINB12 632 09/02/2014   Lab Results  Component Value Date   TSH 0.219 (L) 06/13/2016       ASSESSMENT AND PLAN  Generalized tonic-clonic seizure (HCC)  Depression, unspecified depression type  Polyneuropathy   Continue Topamax 100 mg p.o. twice daily.  We discussed if she had a seizure I would increase the dose.06/15/2016    EEG was negative in hospital 01/20/2021.   MRI was normal.   She will remain off of Wellbutrin.  We also discussed that she can needs to make sure to avoid tramadol. she will follow-up with 03/22/2021 in 12 months or call sooner call if new or worsening neurologic symptoms   Donaciano Range A. [place], MD, [facility] 10/28/2021, 4:03 PM Certified in Neurology, Clinical Neurophysiology, Sleep Medicine and Neuroimaging  [facility] [address], [address] [place], [place] [address] [phone] 

## 2021-11-02 ENCOUNTER — Emergency Department (HOSPITAL_COMMUNITY)
Admission: EM | Admit: 2021-11-02 | Discharge: 2021-11-02 | Discharge status: Against Medical Advice | Payer: BC Managed Care – PPO | Source: Home / Self Care

## 2021-11-02 ENCOUNTER — Other Ambulatory Visit: Payer: Self-pay

## 2021-11-04 ENCOUNTER — Inpatient Hospital Stay (HOSPITAL_COMMUNITY)
Admission: EM | Admit: 2021-11-04 | Discharge: 2021-11-07 | DRG: 486 | Disposition: A | Discharge status: Home / Self Care | Payer: BC Managed Care – PPO | Attending: Internal Medicine | Admitting: Internal Medicine

## 2021-11-04 ENCOUNTER — Emergency Department (HOSPITAL_COMMUNITY): Payer: BC Managed Care – PPO

## 2021-11-04 ENCOUNTER — Encounter (HOSPITAL_COMMUNITY): Payer: Self-pay

## 2021-11-04 ENCOUNTER — Emergency Department (HOSPITAL_BASED_OUTPATIENT_CLINIC_OR_DEPARTMENT_OTHER): Payer: BC Managed Care – PPO

## 2021-11-04 ENCOUNTER — Inpatient Hospital Stay (HOSPITAL_COMMUNITY): Payer: BC Managed Care – PPO

## 2021-11-04 DIAGNOSIS — J4550 Severe persistent asthma, uncomplicated: Secondary | ICD-10-CM | POA: Diagnosis present

## 2021-11-04 DIAGNOSIS — I110 Hypertensive heart disease with heart failure: Secondary | ICD-10-CM | POA: Diagnosis present

## 2021-11-04 DIAGNOSIS — K760 Fatty (change of) liver, not elsewhere classified: Secondary | ICD-10-CM | POA: Diagnosis present

## 2021-11-04 DIAGNOSIS — A419 Sepsis, unspecified organism: Secondary | ICD-10-CM | POA: Diagnosis present

## 2021-11-04 DIAGNOSIS — Z720 Tobacco use: Secondary | ICD-10-CM | POA: Diagnosis present

## 2021-11-04 DIAGNOSIS — M71161 Other infective bursitis, right knee: Principal | ICD-10-CM

## 2021-11-04 DIAGNOSIS — R7401 Elevation of levels of liver transaminase levels: Secondary | ICD-10-CM | POA: Diagnosis present

## 2021-11-04 DIAGNOSIS — F1021 Alcohol dependence, in remission: Secondary | ICD-10-CM | POA: Diagnosis present

## 2021-11-04 DIAGNOSIS — L538 Other specified erythematous conditions: Secondary | ICD-10-CM | POA: Diagnosis not present

## 2021-11-04 DIAGNOSIS — Z9049 Acquired absence of other specified parts of digestive tract: Secondary | ICD-10-CM

## 2021-11-04 DIAGNOSIS — E785 Hyperlipidemia, unspecified: Secondary | ICD-10-CM | POA: Diagnosis present

## 2021-11-04 DIAGNOSIS — Z6834 Body mass index (BMI) 34.0-34.9, adult: Secondary | ICD-10-CM

## 2021-11-04 DIAGNOSIS — K219 Gastro-esophageal reflux disease without esophagitis: Secondary | ICD-10-CM | POA: Diagnosis present

## 2021-11-04 DIAGNOSIS — K861 Other chronic pancreatitis: Secondary | ICD-10-CM | POA: Diagnosis present

## 2021-11-04 DIAGNOSIS — R609 Edema, unspecified: Secondary | ICD-10-CM

## 2021-11-04 DIAGNOSIS — Z888 Allergy status to other drugs, medicaments and biological substances status: Secondary | ICD-10-CM | POA: Diagnosis not present

## 2021-11-04 DIAGNOSIS — I10 Essential (primary) hypertension: Secondary | ICD-10-CM | POA: Diagnosis present

## 2021-11-04 DIAGNOSIS — Y990 Civilian activity done for income or pay: Secondary | ICD-10-CM | POA: Diagnosis not present

## 2021-11-04 DIAGNOSIS — Z886 Allergy status to analgesic agent status: Secondary | ICD-10-CM

## 2021-11-04 DIAGNOSIS — E876 Hypokalemia: Secondary | ICD-10-CM | POA: Diagnosis present

## 2021-11-04 DIAGNOSIS — W19XXXA Unspecified fall, initial encounter: Secondary | ICD-10-CM | POA: Diagnosis present

## 2021-11-04 DIAGNOSIS — Z79899 Other long term (current) drug therapy: Secondary | ICD-10-CM | POA: Diagnosis not present

## 2021-11-04 DIAGNOSIS — R7989 Other specified abnormal findings of blood chemistry: Secondary | ICD-10-CM | POA: Diagnosis present

## 2021-11-04 DIAGNOSIS — G40909 Epilepsy, unspecified, not intractable, without status epilepticus: Secondary | ICD-10-CM | POA: Diagnosis present

## 2021-11-04 DIAGNOSIS — L03115 Cellulitis of right lower limb: Secondary | ICD-10-CM | POA: Diagnosis present

## 2021-11-04 DIAGNOSIS — G8929 Other chronic pain: Secondary | ICD-10-CM | POA: Diagnosis present

## 2021-11-04 DIAGNOSIS — F17210 Nicotine dependence, cigarettes, uncomplicated: Secondary | ICD-10-CM | POA: Diagnosis present

## 2021-11-04 DIAGNOSIS — M7041 Prepatellar bursitis, right knee: Secondary | ICD-10-CM | POA: Diagnosis not present

## 2021-11-04 DIAGNOSIS — Z7951 Long term (current) use of inhaled steroids: Secondary | ICD-10-CM

## 2021-11-04 DIAGNOSIS — G629 Polyneuropathy, unspecified: Secondary | ICD-10-CM | POA: Diagnosis present

## 2021-11-04 DIAGNOSIS — E669 Obesity, unspecified: Secondary | ICD-10-CM | POA: Diagnosis present

## 2021-11-04 DIAGNOSIS — J309 Allergic rhinitis, unspecified: Secondary | ICD-10-CM | POA: Diagnosis present

## 2021-11-04 DIAGNOSIS — I5032 Chronic diastolic (congestive) heart failure: Secondary | ICD-10-CM | POA: Diagnosis present

## 2021-11-04 DIAGNOSIS — Z91010 Allergy to peanuts: Secondary | ICD-10-CM

## 2021-11-04 DIAGNOSIS — S8001XA Contusion of right knee, initial encounter: Secondary | ICD-10-CM | POA: Diagnosis present

## 2021-11-04 DIAGNOSIS — Z8249 Family history of ischemic heart disease and other diseases of the circulatory system: Secondary | ICD-10-CM

## 2021-11-04 LAB — COMPREHENSIVE METABOLIC PANEL
ALT: 50 U/L — ABNORMAL HIGH (ref 0–44)
AST: 211 U/L — ABNORMAL HIGH (ref 15–41)
Albumin: 2.9 g/dL — ABNORMAL LOW (ref 3.5–5.0)
Alkaline Phosphatase: 401 U/L — ABNORMAL HIGH (ref 38–126)
Anion gap: 10 (ref 5–15)
BUN: <5 mg/dL — ABNORMAL LOW (ref 6–20)
CO2: 26 mmol/L (ref 22–32)
Calcium: 8.1 mg/dL — ABNORMAL LOW (ref 8.9–10.3)
Chloride: 101 mmol/L (ref 98–111)
Creatinine, Ser: 0.64 mg/dL (ref 0.44–1.00)
GFR, Estimated: >60 mL/min (ref >60)
Glucose, Bld: 129 mg/dL — ABNORMAL HIGH (ref 70–99)
Potassium: 2.8 mmol/L — ABNORMAL LOW (ref 3.5–5.1)
Sodium: 137 mmol/L (ref 135–145)
Total Bilirubin: 1.3 mg/dL — ABNORMAL HIGH (ref 0.3–1.2)
Total Protein: 6.7 g/dL (ref 6.5–8.1)

## 2021-11-04 LAB — CBC WITH DIFFERENTIAL/PLATELET
Abs Immature Granulocytes: 0.01 10*3/uL (ref 0.00–0.07)
Basophils Absolute: 0.1 10*3/uL (ref 0.0–0.1)
Basophils Relative: 1 %
Eosinophils Absolute: 0.1 10*3/uL (ref 0.0–0.5)
Eosinophils Relative: 2 %
HCT: 36.3 % (ref 36.0–46.0)
Hemoglobin: 12.1 g/dL (ref 12.0–15.0)
Immature Granulocytes: 0 %
Lymphocytes Relative: 25 %
Lymphs Abs: 1.4 10*3/uL (ref 0.7–4.0)
MCH: 32.4 pg (ref 26.0–34.0)
MCHC: 33.3 g/dL (ref 30.0–36.0)
MCV: 97.1 fL (ref 80.0–100.0)
Monocytes Absolute: 0.5 10*3/uL (ref 0.1–1.0)
Monocytes Relative: 9 %
Neutro Abs: 3.7 10*3/uL (ref 1.7–7.7)
Neutrophils Relative %: 63 %
Platelets: 142 10*3/uL — ABNORMAL LOW (ref 150–400)
RBC: 3.74 MIL/uL — ABNORMAL LOW (ref 3.87–5.11)
RDW: 14.7 % (ref 11.5–15.5)
WBC: 5.9 10*3/uL (ref 4.0–10.5)
nRBC: 0 % (ref 0.0–0.2)

## 2021-11-04 LAB — MAGNESIUM: Magnesium: 1.6 mg/dL — ABNORMAL LOW (ref 1.7–2.4)

## 2021-11-04 LAB — HEPATITIS PANEL, ACUTE
HCV Ab: NONREACTIVE
Hep A IgM: NONREACTIVE
Hep B C IgM: NONREACTIVE
Hepatitis B Surface Ag: NONREACTIVE

## 2021-11-04 LAB — LACTIC ACID, PLASMA
Lactic Acid, Venous: 3.5 mmol/L (ref 0.5–1.9)
Lactic Acid, Venous: 4.2 mmol/L (ref 0.5–1.9)

## 2021-11-04 LAB — SEDIMENTATION RATE: Sed Rate: 24 mm/hr — ABNORMAL HIGH (ref 0–22)

## 2021-11-04 LAB — C-REACTIVE PROTEIN: CRP: 1.1 mg/dL — ABNORMAL HIGH (ref <1.0)

## 2021-11-04 MED ORDER — FLUTICASONE FUROATE-VILANTEROL 200-25 MCG/ACT IN AEPB
1.0000 | INHALATION_SPRAY | Freq: Every day | RESPIRATORY_TRACT | Status: DC
Start: 2021-11-04 — End: 2021-11-06
  Administered 2021-11-05: 1 via RESPIRATORY_TRACT
  Filled 2021-11-04: qty 28

## 2021-11-04 MED ORDER — HYDROMORPHONE HCL 1 MG/ML IJ SOLN
0.5000 mg | Freq: Once | INTRAMUSCULAR | Status: AC | PRN
  Administered 2021-11-04: 0.5 mg via INTRAVENOUS
  Filled 2021-11-04: qty 0.5

## 2021-11-04 MED ORDER — GABAPENTIN 300 MG PO CAPS
900.0000 mg | ORAL_CAPSULE | Freq: Two times a day (BID) | ORAL | Status: DC
  Administered 2021-11-04 – 2021-11-07 (×6): 900 mg via ORAL
  Filled 2021-11-04 (×6): qty 3

## 2021-11-04 MED ORDER — VANCOMYCIN HCL 2000 MG/400ML IV SOLN
2000.0000 mg | Freq: Once | INTRAVENOUS | Status: DC
  Filled 2021-11-04: qty 400

## 2021-11-04 MED ORDER — LACTATED RINGERS IV BOLUS
500.0000 mL | Freq: Once | INTRAVENOUS | Status: DC
Start: 2021-11-04 — End: 2021-11-04

## 2021-11-04 MED ORDER — HYDROMORPHONE HCL 2 MG PO TABS
2.0000 mg | ORAL_TABLET | Freq: Four times a day (QID) | ORAL | Status: DC | PRN
  Administered 2021-11-04 – 2021-11-05 (×2): 2 mg via ORAL
  Filled 2021-11-04 (×3): qty 1

## 2021-11-04 MED ORDER — ONDANSETRON HCL 4 MG PO TABS: 4.0000 mg | ORAL_TABLET | Freq: Four times a day (QID) | ORAL | Status: DC | PRN

## 2021-11-04 MED ORDER — SODIUM CHLORIDE 0.9 % IV SOLN
2.0000 g | INTRAVENOUS | Status: AC
  Administered 2021-11-04 – 2021-11-06 (×3): 2 g via INTRAVENOUS
  Filled 2021-11-04 (×3): qty 20

## 2021-11-04 MED ORDER — POTASSIUM CHLORIDE CRYS ER 20 MEQ PO TBCR
40.0000 meq | EXTENDED_RELEASE_TABLET | Freq: Once | ORAL | Status: AC
  Administered 2021-11-04: 40 meq via ORAL
  Filled 2021-11-04: qty 2

## 2021-11-04 MED ORDER — PANCRELIPASE (LIP-PROT-AMYL) 12000-38000 UNITS PO CPEP
72000.0000 [IU] | ORAL_CAPSULE | Freq: Three times a day (TID) | ORAL | Status: DC
  Administered 2021-11-04 – 2021-11-07 (×8): 72000 [IU] via ORAL
  Filled 2021-11-04 (×2): qty 6
  Filled 2021-11-04 (×2): qty 2
  Filled 2021-11-04 (×5): qty 6

## 2021-11-04 MED ORDER — PANTOPRAZOLE SODIUM 40 MG PO TBEC
40.0000 mg | DELAYED_RELEASE_TABLET | Freq: Every day | ORAL | Status: DC
  Administered 2021-11-04 – 2021-11-06 (×3): 40 mg via ORAL
  Filled 2021-11-04 (×3): qty 1

## 2021-11-04 MED ORDER — MORPHINE SULFATE (PF) 4 MG/ML IV SOLN: 4.0000 mg | Freq: Once | INTRAVENOUS | Status: DC

## 2021-11-04 MED ORDER — VANCOMYCIN HCL 2000 MG/400ML IV SOLN
2000.0000 mg | Freq: Once | INTRAVENOUS | Status: AC
  Administered 2021-11-04: 2000 mg via INTRAVENOUS
  Filled 2021-11-04: qty 400

## 2021-11-04 MED ORDER — VANCOMYCIN HCL IN DEXTROSE 1-5 GM/200ML-% IV SOLN
1000.0000 mg | Freq: Two times a day (BID) | INTRAVENOUS | Status: DC
  Administered 2021-11-04 – 2021-11-05 (×2): 1000 mg via INTRAVENOUS
  Filled 2021-11-04 (×2): qty 200

## 2021-11-04 MED ORDER — MONTELUKAST SODIUM 10 MG PO TABS
10.0000 mg | ORAL_TABLET | Freq: Every day | ORAL | Status: DC
  Administered 2021-11-04 – 2021-11-06 (×3): 10 mg via ORAL
  Filled 2021-11-04 (×4): qty 1

## 2021-11-04 MED ORDER — HYDROMORPHONE HCL 1 MG/ML IJ SOLN
1.0000 mg | Freq: Once | INTRAMUSCULAR | Status: AC
  Administered 2021-11-04: 1 mg via INTRAVENOUS
  Filled 2021-11-04: qty 1

## 2021-11-04 MED ORDER — LACTATED RINGERS IV BOLUS
2000.0000 mL | Freq: Once | INTRAVENOUS | Status: AC
  Administered 2021-11-04: 2000 mL via INTRAVENOUS

## 2021-11-04 MED ORDER — SODIUM CHLORIDE 0.9 % IV BOLUS (SEPSIS)
1000.0000 mL | Freq: Once | INTRAVENOUS | Status: AC
  Administered 2021-11-04: 1000 mL via INTRAVENOUS

## 2021-11-04 MED ORDER — OXYCODONE HCL 5 MG PO TABS: 5.0000 mg | ORAL_TABLET | Freq: Four times a day (QID) | ORAL | Status: DC | PRN

## 2021-11-04 MED ORDER — POTASSIUM CHLORIDE 10 MEQ/100ML IV SOLN
10.0000 meq | INTRAVENOUS | Status: AC
  Administered 2021-11-04 (×4): 10 meq via INTRAVENOUS
  Filled 2021-11-04 (×4): qty 100

## 2021-11-04 MED ORDER — HYDROMORPHONE HCL 1 MG/ML IJ SOLN
0.5000 mg | Freq: Once | INTRAMUSCULAR | Status: AC
  Administered 2021-11-04: 0.5 mg via INTRAVENOUS
  Filled 2021-11-04: qty 1

## 2021-11-04 MED ORDER — TOPIRAMATE 100 MG PO TABS
100.0000 mg | ORAL_TABLET | Freq: Two times a day (BID) | ORAL | Status: DC
  Administered 2021-11-04 – 2021-11-07 (×6): 100 mg via ORAL
  Filled 2021-11-04 (×6): qty 1

## 2021-11-04 MED ORDER — SERTRALINE HCL 100 MG PO TABS
100.0000 mg | ORAL_TABLET | Freq: Every day | ORAL | Status: DC
  Administered 2021-11-05 – 2021-11-07 (×3): 100 mg via ORAL
  Filled 2021-11-04 (×2): qty 1
  Filled 2021-11-04: qty 2
  Filled 2021-11-04: qty 1

## 2021-11-04 MED ORDER — MORPHINE SULFATE (PF) 2 MG/ML IV SOLN
2.0000 mg | INTRAVENOUS | Status: DC | PRN
  Administered 2021-11-04: 2 mg via INTRAVENOUS
  Filled 2021-11-04: qty 1

## 2021-11-04 MED ORDER — MELATONIN 5 MG PO TABS
10.0000 mg | ORAL_TABLET | Freq: Every day | ORAL | Status: DC
Start: 2021-11-04 — End: 2021-11-07
  Administered 2021-11-04 – 2021-11-06 (×3): 10 mg via ORAL
  Filled 2021-11-04 (×4): qty 2

## 2021-11-04 MED ORDER — QUETIAPINE FUMARATE 50 MG PO TABS
100.0000 mg | ORAL_TABLET | Freq: Every day | ORAL | Status: DC
  Administered 2021-11-04 – 2021-11-06 (×3): 100 mg via ORAL
  Filled 2021-11-04 (×3): qty 2

## 2021-11-04 MED ORDER — ONDANSETRON HCL 4 MG/2ML IJ SOLN: 4.0000 mg | Freq: Four times a day (QID) | INTRAMUSCULAR | Status: DC | PRN

## 2021-11-04 MED ORDER — ADULT MULTIVITAMIN W/MINERALS CH
1.0000 | ORAL_TABLET | Freq: Every day | ORAL | Status: DC
  Administered 2021-11-05 – 2021-11-07 (×3): 1 via ORAL
  Filled 2021-11-04 (×3): qty 1

## 2021-11-04 MED ORDER — LIDOCAINE-EPINEPHRINE (PF) 2 %-1:200000 IJ SOLN
20.0000 mL | Freq: Once | INTRAMUSCULAR | Status: AC
  Administered 2021-11-04: 20 mL
  Filled 2021-11-04: qty 20

## 2021-11-04 NOTE — ED Notes (Signed)
Spoke with Dr. Freida Busman and he is aware patient's lactic acid is 3.5

## 2021-11-04 NOTE — ED Provider Notes (Signed)
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 female. Patient states that she fell 12 days ago and landed on her right knee. She has had worsening swelling and redness of her right lower extremity. She says that most of her swelling is in the ankle and calf, but the pain is mostly on top of her right patella. She states she was seen by Sports Medicine two days ago and was sent to the ED for presumed septic bursitis and may need to go to the OR. She said she originally went to [facility], but left without being seen due to the wait. She represented here to [facility] today due to continued symptoms. She has not taken anything for pain. She denies any fevers, chills.   HPI     Home Medications Prior to Admission medications   Medication Sig Start Date End Date Taking? Authorizing Provider  albuterol (VENTOLIN HFA) 108 (90 Base) MCG/ACT inhaler 2 puffs every 4 hours as needed for coughing or wheezing. 06/04/19   Ambs, Kathrine Cords, FNP  BREO ELLIPTA 200-25 MCG/INH AEPB INHALE 1 PUFF INTO THE LUNGS DAILY Patient taking differently: Inhale 1 puff into the lungs daily as needed (shortness of breath/wheezing). 05/28/19   Kozlow, Donnamarie Poag, MD  Cholecalciferol (VITAMIN D PO) Take 1,000 Units by mouth daily.    [provider]  DUPIXENT 300 MG/2ML prefilled syringe INJECT 300 MG SUBCUTANEOUSLY EVERY OTHER WEEK 06/02/20   Kozlow, Donnamarie Poag, MD  EPINEPHrine (EPIPEN 2-PAK) 0.3 mg/0.3 mL IJ SOAJ injection Inject 0.3 mLs (0.3 mg total) into the muscle once as needed (for severe allergic reaction). 07/11/18   Kozlow, Donnamarie Poag, MD  gabapentin (NEURONTIN) 300 MG capsule TAKE 3 CAPSULES BY MOUTH TWICE DAILY  12/27/18   Sater, Nanine Means, MD  lipase/protease/amylase (CREON) 36000 UNITS CPEP capsule Take 72,000 Units by mouth 3 (three) times daily with meals. 04/28/20   [provider]  magnesium oxide (MAGOX 400) 400 (240 Mg) MG tablet Take 1 tablet (400 mg total) by mouth daily. 12/07/20   Regan Lemming, MD  Melatonin 10 MG TABS Take 10 mg by mouth at bedtime.    [provider]  metoprolol succinate (TOPROL-XL) 50 MG 24 hr tablet Take 1 tablet (50 mg total) by mouth daily. Pt needs to keep upcoming appt in Nov for 90 day refills 02/17/21   Josue Hector, MD  montelukast (SINGULAIR) 10 MG tablet TAKE ONE TABLET BY MOUTH AT BEDTIME 12/13/19   Ambs, Kathrine Cords, FNP  Multiple Vitamins-Minerals (MULTIVITAMIN WITH MINERALS) tablet Take 1 tablet by mouth daily.    [provider]  naloxone [facility]) nasal spray 4 mg/0.1 mL SMARTSIG:Both Nares 04/22/21   [provider]  omeprazole (PRILOSEC) 40 MG capsule TAKE 1 CAPSULE(40 MG) BY MOUTH DAILY 01/08/20   Ambs, Kathrine Cords, FNP  ondansetron (ZOFRAN-ODT) 8 MG disintegrating tablet Take 8 mg by mouth 3 (three) times daily as needed. 04/09/21   [provider]  oxyCODONE (ROXICODONE) 15 MG immediate release tablet Take 15 mg by mouth 3 (three) times daily. As needed 01/06/21   [provider]  polyethylene glycol (MIRALAX / GLYCOLAX) 17 g packet Take 17 g by  mouth daily as needed for mild constipation or moderate constipation. 08/10/20   Pokhrel, Corrie Mckusick, MD  QUEtiapine (SEROQUEL) 100 MG tablet Take 100 mg by mouth at bedtime. 03/23/19   [provider]  rosuvastatin (CRESTOR) 10 MG tablet Take 10 mg by mouth daily.    [provider]  sertraline (ZOLOFT) 100 MG tablet Take 1 tablet (100 mg total) by mouth daily. 04/23/21   Sater, Nanine Means, MD  spironolactone (ALDACTONE) 25 MG tablet Take 25 mg by mouth daily as needed (leg swelling). 11/14/19   [provider]  topiramate (TOPAMAX) 100 MG tablet Take 1  tablet (100 mg total) by mouth 2 (two) times daily. 10/28/21   Sater, Nanine Means, MD      Allergies    Acetaminophen, Nsaids, Peanuts [peanut oil], and Triamterene-hctz    Review of Systems   Review of Systems  Constitutional:  Negative for chills and fever.  Cardiovascular:  Positive for leg swelling.  Musculoskeletal:  Positive for arthralgias and joint swelling.  Skin:  Positive for color change.  All other systems reviewed and are negative.   Physical Exam Updated Vital Signs BP 109/74   Pulse 88   Temp 98.6 F (37 C) (Oral)   Resp 18   SpO2 97%  Physical Exam Vitals and nursing note reviewed.  Constitutional:      General: She is not in acute distress.    Appearance: Normal appearance. She is well-developed. She is not ill-appearing, toxic-appearing or diaphoretic.  HENT:     Head: Normocephalic and atraumatic.     Nose: No nasal deformity.     Mouth/Throat:     Lips: Pink. No lesions.  Eyes:     General: Gaze aligned appropriately. No scleral icterus.       Right eye: No discharge.        Left eye: No discharge.     Conjunctiva/sclera: Conjunctivae normal.     Right eye: Right conjunctiva is not injected. No exudate or hemorrhage.    Left eye: Left conjunctiva is not injected. No exudate or hemorrhage. Pulmonary:     Effort: Pulmonary effort is normal. No respiratory distress.  Musculoskeletal:     Comments:  Right knee: tenderness and swelling overlying the patella, no notable joint effusion.  Swelling, tenderness, and erythema over the right calf, ankle, and foot.  Warm to touch.   Able to range right knee without significant difficulty.   1+ pedal pulse Sensation intact.  Skin:    General: Skin is warm and dry.  Neurological:     Mental Status: She is alert and oriented to person, place, and time.  Psychiatric:        Mood and Affect: Mood normal.        Speech: Speech normal.        Behavior: Behavior normal. Behavior is cooperative.     ED Results  / Procedures / Treatments   Labs (all labs ordered are listed, but only abnormal results are displayed) Labs Reviewed  COMPREHENSIVE METABOLIC PANEL - Abnormal; Notable for the following components:      Result Value   Potassium 2.8 (*)    Glucose, Bld 129 (*)    BUN <5 (*)    Calcium 8.1 (*)    Albumin 2.9 (*)    AST 211 (*)    ALT 50 (*)    Alkaline Phosphatase 401 (*)    Total Bilirubin 1.3 (*)    All other components within normal limits  CBC WITH DIFFERENTIAL/PLATELET - Abnormal; Notable for the following components:   RBC 3.74 (*)    Platelets 142 (*)    All other components within normal limits  LACTIC ACID, PLASMA - Abnormal; Notable for the following components:   Lactic Acid, Venous 3.5 (*)    All other components within normal limits  LACTIC ACID, PLASMA - Abnormal; Notable for the following components:   Lactic Acid, Venous 4.2 (*)    All other components within normal limits  SEDIMENTATION RATE - Abnormal; Notable for the following components:   Sed Rate 24 (*)    All other components within normal limits  CULTURE, BLOOD (ROUTINE X 2)  CULTURE, BLOOD (ROUTINE X 2)  BODY FLUID CULTURE W GRAM STAIN  C-REACTIVE PROTEIN    EKG None  Radiology VAS [place] LOWER EXTREMITY VENOUS (DVT) (7a-7p)  Result Date: 11/04/2021  Lower Venous DVT Study Patient Name:  TRINETTE VERA  Date of Exam:   11/04/2021 Medical Rec #: [id number]      Accession #:    [id number] Date of Birth: [birth year]       Patient Gender: F Patient Age:   82 years Exam Location:  [facility] Procedure:      VAS [place] LOWER EXTREMITY VENOUS (DVT) Referring Phys: Aloysius Heinle --------------------------------------------------------------------------------  Indications: Edema, Erythema, and s/p mechanical fall x1 week.  Limitations: Poor ultrasound/tissue interface. Comparison Study: No prior study Performing Technologist: Maudry Mayhew MHA, RDMS, RVT, RDCS  Examination Guidelines: A complete evaluation  includes B-mode imaging, spectral Doppler, color Doppler, and power Doppler as needed of all accessible portions of each vessel. Bilateral testing is considered an integral part of a complete examination. Limited examinations for reoccurring indications may be performed as noted. The reflux portion of the exam is performed with the patient in reverse Trendelenburg.  +---------+---------------+---------+-----------+----------+--------------+ RIGHT    CompressibilityPhasicitySpontaneityPropertiesThrombus Aging +---------+---------------+---------+-----------+----------+--------------+ CFV      Full           Yes      Yes                                 +---------+---------------+---------+-----------+----------+--------------+ SFJ      Full                                                        +---------+---------------+---------+-----------+----------+--------------+ FV Prox  Full                                                        +---------+---------------+---------+-----------+----------+--------------+ FV Mid   Full                                                        +---------+---------------+---------+-----------+----------+--------------+ FV DistalFull                                                        +---------+---------------+---------+-----------+----------+--------------+  PFV      Full                                                        +---------+---------------+---------+-----------+----------+--------------+ POP      Full           Yes      Yes                                 +---------+---------------+---------+-----------+----------+--------------+ PTV      Full                                                        +---------+---------------+---------+-----------+----------+--------------+   Right Technical Findings: Not visualized segments include peroneal veins.   +----+---------------+---------+-----------+----------+--------------+ LEFTCompressibilityPhasicitySpontaneityPropertiesThrombus Aging +----+---------------+---------+-----------+----------+--------------+ CFV Full           Yes      Yes                                 +----+---------------+---------+-----------+----------+--------------+    Summary: RIGHT: - There is no evidence of deep vein thrombosis in the lower extremity. However, portions of this examination were limited- see technologist comments above.  - No cystic structure found in the popliteal fossa. - Ultrasound characteristics of enlarged lymph nodes are noted in the groin.  LEFT: - No evidence of common femoral vein obstruction.  *See table(s) above for measurements and observations.    Preliminary    DG Knee Complete 4 Views Right  Result Date: 11/04/2021 CLINICAL DATA:  Right knee swelling EXAM: RIGHT KNEE - COMPLETE 5 VIEW COMPARISON:  None Available. FINDINGS: No fracture or dislocation. Small joint effusion. No evidence of arthropathy or other focal bone abnormality. Soft tissue swelling about the knee. IMPRESSION: 1. No acute osseous abnormality. 2. Small joint effusion and soft tissue swelling. Electronically Signed   By: Yetta Glassman M.D.   On: 11/04/2021 08:28    Procedures .Marland KitchenIncision and Drainage  Date/Time: 11/04/2021 10:48 AM  Performed by: Adolphus Birchwood, PA-C Authorized by: Adolphus Birchwood, PA-C   Consent:    Consent obtained:  Verbal   Consent given by:  Patient   Risks, benefits, and alternatives were discussed: yes     Risks discussed:  Bleeding, incomplete drainage, infection, damage to other organs and pain   Alternatives discussed:  No treatment Universal protocol:    Procedure explained and questions answered to patient or proxy's satisfaction: yes     Patient identity confirmed:  Verbally with patient Location:    Type:  Fluid collection   Location:  Lower extremity   Lower extremity  location:  Knee   Knee location:  R knee Pre-procedure details:    Skin preparation:  Chlorhexidine with alcohol and povidone-iodine Sedation:    Sedation type:  None Anesthesia:    Anesthesia method:  Local infiltration   Local anesthetic:  Lidocaine 2% WITH epi Procedure type:    Complexity:  Simple Procedure details:    Ultrasound guidance: no     Needle aspiration: yes  Needle size:  18 G   Drainage:  Bloody   Drainage amount:  Scant Post-procedure details:    Procedure completion:  Tolerated .Critical Care  Performed by: Adolphus Birchwood, PA-C Authorized by: Adolphus Birchwood, PA-C   Critical care provider statement:    Critical care time (minutes):  45   Critical care time was exclusive of:  Separately billable procedures and treating other patients   Critical care was necessary to treat or prevent imminent or life-threatening deterioration of the following conditions:  Sepsis   Critical care was time spent personally by me on the following activities:  Blood draw for specimens, development of treatment plan with patient or surrogate, discussions with consultants, discussions with primary provider, evaluation of patient's response to treatment, examination of patient, interpretation of cardiac output measurements, obtaining history from patient or surrogate, review of old charts, pulse oximetry, ordering and review of radiographic studies, ordering and review of laboratory studies, ordering and performing treatments and interventions and re-evaluation of patient's condition   Care discussed with: admitting provider      Medications Ordered in ED Medications  potassium chloride SA (KLOR-CON M) CR tablet 40 mEq (has no administration in time range)  potassium chloride 10 mEq in 100 mL IVPB (has no administration in time range)  vancomycin (VANCOREADY) IVPB 2000 mg/400 mL (has no administration in time range)  HYDROmorphone (DILAUDID) injection 1 mg (1 mg Intravenous Given  11/04/21 0802)  lactated ringers bolus 2,000 mL (2,000 mLs Intravenous New Bag/Given 11/04/21 0944)  lidocaine-EPINEPHrine (XYLOCAINE W/EPI) 2 %-1:200000 (PF) injection 20 mL (20 mLs Infiltration Given 11/04/21 1010)  HYDROmorphone (DILAUDID) injection 0.5 mg (0.5 mg Intravenous Given 11/04/21 1019)    ED Course/ Medical Decision Making/ A&P Clinical Course as of 11/04/21 1049  Wed Nov 04, 2021  0904 DVT study is negative [GL]  0922 Lactic Acid 3.5.  Potassium is 2.8.  She also has elevated LFTs with AST of 211, ALT 50, alkaline phosphatase 401.  T. bili is 1.3. [GL]  [id number] Patient with likely septic bursitis and right lower extremity cellulitis. Elevated Lactate here. Have ordered blood cultures. Started on Vancomycin. Consulting Orthopedic Surgery. Will admit to hospitalist [GL]  [phone] Dr. Erlinda Hong consulted. He will come see the patient. Recommend aspiration of bursa fluid pocket. Start abx following this.  [GL]    Clinical Course User Index [GL] Sherre Poot Adora Fridge, PA-C                           Medical Decision Making Amount and/or Complexity of Data Reviewed Labs: ordered. Radiology: ordered.  Risk Prescription drug management.    MDM  This is a 36 y.o. female who presents to the ED with right leg swelling and redness The differential of this patient includes but is not limited to septic joint, septic bursitis, nonseptic bursitis, DVT, Cellulitis, fracture  Initial Impression  Well appearing. Stable vitals. Afebrile Patient presenting with concern for septic bursitis from Sports Medicine outpatient provider, however, on  my exam, she has normal range of motion of the right knee without significant difficulty. Most of her pain, swelling, and warmth is actually on the right foot, ankle, and calf. Consistent with Cellulitis. She does have some mild swelling over the right patella with an abrasion overlying it. This presentation is not really consistent with septic joint, but could be a  septic bursitis. Ordered repeat knee x ray, DVT study, basic labs, and inflammatory markers.  I personally ordered, reviewed, and interpreted all laboratory work and imaging and agree with radiologist interpretation. Results interpreted below: CBC: no leukocytosis, anemia, plt 142 (chronic) CMP: K 2.8, Transaminitis noted with elevated AST 211, ALT 50, Alk Phos 401 Lactate 3.5 > 4.2 CRP pending ESR 24 Blood cultures pending Knee XR shows small effusion, no fracture or dislocation DVT study negative Body fluid culture from right bursa in process.   Assessment/Plan:  There is high concern for infectious process given rising lactate. Patient is not exhibiting any systemic symptoms though.  I have consulted orthopedic surgery and spoken with Dr. Erlinda Hong from orthopedic surgery who will see patient. He recommends aspiration of bursa. I have obtained a body fluid culture of the right bursa. This was mostly bloody drainage and not large amount. IV Vancomycin will be started as well as 2 L of LR.  She also has hypokalemia which we will replace.  Transaminitis also noted. This could be related to septic process versus alcohol use. She does have history of ETOH disorder, but patient states that she does not drink much anymore and has not increased intake recently. No abdominal symptoms either.   I have discussed case with Dr. Marylyn Ishihara from hospitalist who will evaluate patient for admission   Charting Requirements Additional history is obtained from:  Independent historian External Records from outside source obtained and reviewed including: recent sports medicine note, prior labs Social Determinants of Health:  Alcoholism/Drug Addiction Pertinant PMH that complicates patient's illness: n/a  Patient Care Problems that were addressed during this visit: - Cellulitis: Acute illness with systemic symptoms - Prepatellar bursitis: Acute illness with complication - Elevate Lactic Acid: Acute illness with  complication - Transaminitis: Acute illness - Hypokalemia: Acute illness This patient was maintained on a cardiac monitor/telemetry. I personally viewed and interpreted the cardiac monitor which reveals an underlying rhythm of NSR Medications given in ED: Dilaudid x 2, LR 2 L, Vancomycin, KCl Reevaluation of the patient after these medicines showed that the patient stayed the same I have reviewed home medications and made changes accordingly.  Critical Care Interventions: Sepsis. See procedure note Consultations: Orthopedic surgery, [facility] hospitalist Disposition: Admit  This is a shared visit with my attending physician, Dr. Zenia Resides.  We have discussed this patient and they have independently evaluated this patient. The plan was altered or changed as needed.  Portions of this note were generated with [employer]. Dictation errors may occur despite best attempts at proofreading.         Final Clinical Impression(s) / ED Diagnoses Final diagnoses:  Cellulitis of right lower extremity  Prepatellar bursitis of right knee  Elevated lactic acid level  Transaminitis  Hypokalemia    Rx / DC Orders ED Discharge Orders     None         Adolphus Birchwood, PA-C 11/04/21 1049    Lacretia Leigh, MD 11/04/21 1553

## 2021-11-04 NOTE — Progress Notes (Signed)
A consult was received from an ED physician for vancomycin per pharmacy dosing.  The patient's profile has been reviewed for ht/wt/allergies/indication/available labs.   A one time order has been placed for vancomycin 2 gm IV x 1 dose.    Further antibiotics/pharmacy consults should be ordered by admitting physician if indicated.                       Thank you,  Herby Abraham, Pharm.D 11/04/2021 10:45 AM

## 2021-11-04 NOTE — ED Triage Notes (Signed)
Pt arrived via POV, c/o swelling and pain to right knee/leg and foot. States mechanical fall several days ago. Denies any other injury/pain.

## 2021-11-04 NOTE — Progress Notes (Addendum)
Pharmacy Antibiotic Note  Kara [name] is a 36 y.o. female admitted on 11/04/2021 with Right knee septic bursitis & cellulitis Pharmacy has been consulted for vancomycin dosing.  Plan: Vancomycin 2 gm x 1 given in ED Vancomycin 1 gm q12; AUC 574.7, Css min 15.1 using SCr 0.8, Vd 0.5 Rocephin 2 gm IV q24 per MD F/u renal function, WBC, temp, culture data   Temp (24hrs), Avg:98.6 F (37 C), Min:98.6 F (37 C), Max:98.6 F (37 C)  Recent Labs  Lab 11/04/21 0805 11/04/21 0943  WBC 5.9  --   CREATININE 0.64  --   LATICACIDVEN 3.5* 4.2*    Estimated Creatinine Clearance: 110.2 mL/min (by C-G formula based on SCr of 0.64 mg/dL).    Allergies  Allergen Reactions   Acetaminophen Diarrhea and Nausea And Vomiting   Nsaids Diarrhea and Nausea And Vomiting   Other Hives and Itching    Reaction to tree nuts   Peanuts [Peanut Oil] Hives and Itching   Triamterene-Hctz Nausea Only and Other (See Comments)    dizzy    Antimicrobials this admission: 7/19 vanc>>  Dose adjustments this admission:  Microbiology results: 7/19 BCx2: sent 7/19 R knee I&D Cx: sent  Thank you for allowing pharmacy to be a part of this patient's care.  Herby Abraham, Pharm.D 11/04/2021 1:20 PM

## 2021-11-04 NOTE — ED Provider Notes (Signed)
I provided a substantive portion of the care of this patient.  I personally performed the entirety of the medical decision making for this encounter.   36 year old female presents with right prepatellar and right lower extremity swelling and erythema.  Patient has evidence of prepatellar septic bursitis as well as lower extremity cellulitis.  She also has a significant increase lactate 3.5.  Blood cultures obtained.  Given IV antibiotics..  Patient to be admitted      Lorre Nick, MD 11/04/21 [phone]

## 2021-11-04 NOTE — Consult Note (Signed)
Reason for Consult:Right knee septic bursitis Referring Physician: Lorre Nick Time called: 1032 Time at bedside: 1053   Kara [name] is an 36 y.o. female.  HPI: Zaley fell about two weeks ago and landed on her right knee. Aside from some superficial abrasions she did not have an external wound. The knee swelled and turned red a few days later. She was seen at [facility] and was thought to have a hematoma. She was sent to PT (I think) who thought is was infected and she was sent to the ED. Septic bursitis was diagnosed along with lower leg cellulitis and orthopedic surgery was consulted. She works as an Magazine features editor at [employer].  Past Medical History:  Diagnosis Date   Alcoholism in remission (HCC)    per pt in remission since 2017   Anemia    AR (allergic rhinitis)    allergist-- dr Lucie Leather   Biliary dyskinesia    Diastolic CHF (HCC)    followd by dr Demetrius Charity. Eden Emms   Environmental allergies    GERD (gastroesophageal reflux disease)    Headache    History of acute pancreatitis    alcoholic pancreatitis ,  2016 and 2017   History of sepsis 06/12/2016   with CAP   History of syncope    History of thrombosis 01/29/2015   splenic vein thrombosis-- treated with coumadin--- readmitted for abd. hematoma due to coumadin treated with IR embolization of the IMA branches   Hypertension    Polyneuropathy    Prolonged QT syndrome    cardiologist-- dr Eden Emms (notes in epic)   Severe persistent asthma    pulmonologist-- dr dr Belva Crome (notes in epic)    Past Surgical History:  Procedure Laterality Date   CHOLECYSTECTOMY N/A 06/21/2018   Procedure: LAPAROSCOPIC CHOLECYSTECTOMY;  Surgeon: Abigail Miyamoto, MD;  Location: [facility];  Service: General;  Laterality: N/A;   ESOPHAGOGASTRODUODENOSCOPY N/A 07/17/2014   Procedure: ESOPHAGOGASTRODUODENOSCOPY (EGD);  Surgeon: Dorena Cookey, MD;  Location: [facility];  Service: Endoscopy;  Laterality: N/A;   TYMPANOSTOMY TUBE PLACEMENT Bilateral 11 MONTHS OLD    VIDEO BRONCHOSCOPY Bilateral 10/08/2016   Procedure: VIDEO BRONCHOSCOPY WITH FLUORO;  Surgeon: Chilton Greathouse, MD;  Location: [facility];  Service: Cardiopulmonary;  Laterality: Bilateral;    Family History  Problem Relation Age of Onset   Heart failure Mother    Colon polyps Mother    Heart failure Father    Diabetes Father    Heart failure Maternal Grandfather    Colon cancer Neg Hx     Social History:  reports that she has been smoking cigarettes. She has a 1.25 pack-year smoking history. She has never used smokeless tobacco. She reports that she does not currently use alcohol. She reports that she does not currently use drugs.  Allergies:  Allergies  Allergen Reactions   Acetaminophen Diarrhea and Nausea And Vomiting   Nsaids Diarrhea and Nausea And Vomiting   Other Hives and Itching    Reaction to tree nuts   Peanuts [Peanut Oil] Hives and Itching   Triamterene-Hctz Nausea Only and Other (See Comments)    dizzy    Medications: I have reviewed the patient's current medications.  Results for orders placed or performed during the hospital encounter of 11/04/21 (from the past 48 hour(s))  Comprehensive metabolic panel     Status: Abnormal   Collection Time: 11/04/21  8:05 AM  Result Value Ref Range   Sodium 137 135 - 145 mmol/L   Potassium 2.8 (L) 3.5 - 5.1  mmol/L   Chloride 101 98 - 111 mmol/L   CO2 26 22 - 32 mmol/L   Glucose, Bld 129 (H) 70 - 99 mg/dL    Comment: Glucose reference range applies only to samples taken after fasting for at least 8 hours.   BUN <5 (L) 6 - 20 mg/dL   Creatinine, Ser 0.63 0.44 - 1.00 mg/dL   Calcium 8.1 (L) 8.9 - 10.3 mg/dL   Total Protein 6.7 6.5 - 8.1 g/dL   Albumin 2.9 (L) 3.5 - 5.0 g/dL   AST 016 (H) 15 - 41 U/L   ALT 50 (H) 0 - 44 U/L   Alkaline Phosphatase 401 (H) 38 - 126 U/L   Total Bilirubin 1.3 (H) 0.3 - 1.2 mg/dL   GFR, Estimated >01 >09 mL/min    Comment: (NOTE) Calculated using the CKD-EPI Creatinine Equation (2021)     Anion gap 10 5 - 15    Comment: Performed at [facility], [address] [place], [place] [address]  CBC with Differential     Status: Abnormal   Collection Time: 11/04/21  8:05 AM  Result Value Ref Range   WBC 5.9 4.0 - 10.5 K/uL   RBC 3.74 (L) 3.87 - 5.11 MIL/uL   Hemoglobin 12.1 12.0 - 15.0 g/dL   HCT 73.2 20.2 - 54.2 %   MCV 97.1 80.0 - 100.0 fL   MCH 32.4 26.0 - 34.0 pg   MCHC 33.3 30.0 - 36.0 g/dL   RDW 70.6 23.7 - 62.8 %   Platelets 142 (L) 150 - 400 K/uL   nRBC 0.0 0.0 - 0.2 %   Neutrophils Relative % 63 %   Neutro Abs 3.7 1.7 - 7.7 K/uL   Lymphocytes Relative 25 %   Lymphs Abs 1.4 0.7 - 4.0 K/uL   Monocytes Relative 9 %   Monocytes Absolute 0.5 0.1 - 1.0 K/uL   Eosinophils Relative 2 %   Eosinophils Absolute 0.1 0.0 - 0.5 K/uL   Basophils Relative 1 %   Basophils Absolute 0.1 0.0 - 0.1 K/uL   Immature Granulocytes 0 %   Abs Immature Granulocytes 0.01 0.00 - 0.07 K/uL    Comment: Performed at [facility], [address], [place], [place] [address]  Lactic acid, plasma     Status: Abnormal   Collection Time: 11/04/21  8:05 AM  Result Value Ref Range   Lactic Acid, Venous 3.5 (HH) 0.5 - 1.9 mmol/L    Comment: CRITICAL RESULT CALLED TO, READ BACK BY AND VERIFIED WITH BIENSANG A. RN AT [phone] ON 11/04/2021 BY MECIAL J. Performed at [facility], [address] [place], [place] [address]   Sedimentation rate     Status: Abnormal   Collection Time: 11/04/21  8:05 AM  Result Value Ref Range   Sed Rate 24 (H) 0 - 22 mm/hr    Comment: Performed at [facility], [address] [place], [place] [address]  Lactic acid, plasma     Status: Abnormal   Collection Time: 11/04/21  9:43 AM  Result Value Ref Range   Lactic Acid, Venous 4.2 (HH) 0.5 - 1.9 mmol/L    Comment: CRITICAL RESULT CALLED TO, READ BACK BY AND VERIFIED WITH MAGGIE B. RN AT 1037 ON 11/04/2021 BY MECIAL J. Performed at [facility], [address] [place], [place] [address]     VAS [place] LOWER EXTREMITY VENOUS (DVT) (7a-7p)  Result Date: 11/04/2021  Lower Venous  DVT Study Patient Name:  DARIANA GARBETT  Date of Exam:   11/04/2021 Medical Rec #: [id number]      Accession #:    [id number] Date of Birth: [birth year]       Patient Gender: F Patient Age:   68 years Exam Location:  [facility] Procedure:      VAS [place] LOWER EXTREMITY VENOUS (DVT) Referring Phys: GRACE LOEFFLER --------------------------------------------------------------------------------  Indications: Edema, Erythema, and s/p mechanical fall x1 week.  Limitations: Poor ultrasound/tissue interface. Comparison Study: No prior study Performing Technologist: Gertie Fey MHA, RDMS, RVT, RDCS  Examination Guidelines: A complete evaluation includes B-mode imaging, spectral Doppler, color Doppler, and power Doppler as needed of all accessible portions of each vessel. Bilateral testing is considered an integral part of a complete examination. Limited examinations for reoccurring indications may be performed as noted. The reflux portion of the exam is performed with the patient in reverse Trendelenburg.  +---------+---------------+---------+-----------+----------+--------------+ RIGHT    CompressibilityPhasicitySpontaneityPropertiesThrombus Aging +---------+---------------+---------+-----------+----------+--------------+ CFV      Full           Yes      Yes                                 +---------+---------------+---------+-----------+----------+--------------+ SFJ      Full                                                        +---------+---------------+---------+-----------+----------+--------------+ FV Prox  Full                                                        +---------+---------------+---------+-----------+----------+--------------+ FV Mid   Full                                                         +---------+---------------+---------+-----------+----------+--------------+ FV DistalFull                                                        +---------+---------------+---------+-----------+----------+--------------+ PFV      Full                                                        +---------+---------------+---------+-----------+----------+--------------+ POP      Full           Yes      Yes                                 +---------+---------------+---------+-----------+----------+--------------+ PTV      Full                                                        +---------+---------------+---------+-----------+----------+--------------+  Right Technical Findings: Not visualized segments include peroneal veins.  +----+---------------+---------+-----------+----------+--------------+ LEFTCompressibilityPhasicitySpontaneityPropertiesThrombus Aging +----+---------------+---------+-----------+----------+--------------+ CFV Full           Yes      Yes                                 +----+---------------+---------+-----------+----------+--------------+    Summary: RIGHT: - There is no evidence of deep vein thrombosis in the lower extremity. However, portions of this examination were limited- see technologist comments above.  - No cystic structure found in the popliteal fossa. - Ultrasound characteristics of enlarged lymph nodes are noted in the groin.  LEFT: - No evidence of common femoral vein obstruction.  *See table(s) above for measurements and observations.    Preliminary    DG Knee Complete 4 Views Right  Result Date: 11/04/2021 CLINICAL DATA:  Right knee swelling EXAM: RIGHT KNEE - COMPLETE 5 VIEW COMPARISON:  None Available. FINDINGS: No fracture or dislocation. Small joint effusion. No evidence of arthropathy or other focal bone abnormality. Soft tissue swelling about the knee. IMPRESSION: 1. No acute osseous abnormality. 2. Small joint effusion and soft  tissue swelling. Electronically Signed   By: Allegra Lai M.D.   On: 11/04/2021 08:28    Review of Systems  Constitutional:  Negative for chills, diaphoresis and fever.  HENT:  Negative for ear discharge, ear pain, hearing loss and tinnitus.   Eyes:  Negative for photophobia and pain.  Respiratory:  Negative for cough and shortness of breath.   Cardiovascular:  Negative for chest pain.  Gastrointestinal:  Negative for abdominal pain, nausea and vomiting.  Genitourinary:  Negative for dysuria, flank pain, frequency and urgency.  Musculoskeletal:  Positive for arthralgias (Right knee). Negative for back pain, myalgias and neck pain.  Neurological:  Negative for dizziness and headaches.  Hematological:  Does not bruise/bleed easily.  Psychiatric/Behavioral:  The patient is not nervous/anxious.    Blood pressure (!) 110/91, pulse 75, temperature 98.6 F (37 C), temperature source Oral, resp. rate (!) 34, SpO2 99 %. Physical Exam Constitutional:      General: She is not in acute distress.    Appearance: She is well-developed. She is not diaphoretic.  HENT:     Head: Normocephalic and atraumatic.  Eyes:     General: No scleral icterus.       Right eye: No discharge.        Left eye: No discharge.     Conjunctiva/sclera: Conjunctivae normal.  Cardiovascular:     Rate and Rhythm: Normal rate and regular rhythm.  Pulmonary:     Effort: Pulmonary effort is normal. No respiratory distress.  Musculoskeletal:     Cervical back: Normal range of motion.     Comments: RLE No traumatic wounds, ecchymosis, or rash  Knee swollen anteriorly with erythema, erythema progressed down ant lower leg  No knee or ankle effusion  Knee stable to varus/ valgus and anterior/posterior stress  Sens DPN, SPN, TN intact  Motor EHL, ext, flex, evers 5/5  DP 2+, PT 1+, No significant edema  Skin:    General: Skin is warm and dry.  Neurological:     Mental Status: She is alert.  Psychiatric:        Mood  and Affect: Mood normal.        Behavior: Behavior normal.     Assessment/Plan: Right knee septic bursitis -- Will I&D. She is getting admitted. Dr. Roda Shutters to  evaluate later today.    Freeman CaldronMichael J. Ely Spragg, PA-C Orthopedic Surgery [phone] 11/04/2021, 11:37 AM

## 2021-11-04 NOTE — H&P (Signed)
History and Physical    Patient: Kara [name] DOB: April 20, 1985 DOA: 11/04/2021 DOS: the patient was seen and examined on 11/04/2021 PCP: [name], MD  Patient coming from: Home  Chief Complaint:  Chief Complaint  Patient presents with   Leg Injury   HPI: Kara [name] is a 36 y.o. female with medical history significant of chronic pancreatitis, chronic pain, asthma, tobacco abuse, chronic diastolic HF. Presenting with right knee pain. She reports that she fell on her right knee about 2 weeks ago. She didn't see in obvious wounds, and although it was painful, she was able to tolerate normal functions with her knee initially. However, about a week ago, she noticed that the knee was getting more swollen and more painful. Her knee and right leg became erythematous. She tried her home pain meds and leg elevation to help, but it didn't. When her symptoms did not improve this morning, she decided to come to the ED for assistance. She denies any other aggravating or alleviating factors.    Review of Systems: As mentioned in the history of present illness. All other systems reviewed and are negative. Past Medical History:  Diagnosis Date   Alcoholism in remission (HCC)    per pt in remission since 2017   Anemia    AR (allergic rhinitis)    allergist-- dr Lucie Leather   Biliary dyskinesia    Diastolic CHF (HCC)    followd by dr Demetrius Charity. Eden Emms   Environmental allergies    GERD (gastroesophageal reflux disease)    Headache    History of acute pancreatitis    alcoholic pancreatitis ,  2016 and 2017   History of sepsis 06/12/2016   with CAP   History of syncope    History of thrombosis 01/29/2015   splenic vein thrombosis-- treated with coumadin--- readmitted for abd. hematoma due to coumadin treated with IR embolization of the IMA branches   Hypertension    Polyneuropathy    Prolonged QT syndrome    cardiologist-- dr Eden Emms (notes in epic)   Severe persistent asthma     pulmonologist-- dr dr Belva Crome (notes in epic)   Past Surgical History:  Procedure Laterality Date   CHOLECYSTECTOMY N/A 06/21/2018   Procedure: LAPAROSCOPIC CHOLECYSTECTOMY;  Surgeon: Abigail Miyamoto, MD;  Location: [facility];  Service: General;  Laterality: N/A;   ESOPHAGOGASTRODUODENOSCOPY N/A 07/17/2014   Procedure: ESOPHAGOGASTRODUODENOSCOPY (EGD);  Surgeon: Dorena Cookey, MD;  Location: [facility];  Service: Endoscopy;  Laterality: N/A;   TYMPANOSTOMY TUBE PLACEMENT Bilateral 11 MONTHS OLD   VIDEO BRONCHOSCOPY Bilateral 10/08/2016   Procedure: VIDEO BRONCHOSCOPY WITH FLUORO;  Surgeon: Chilton Greathouse, MD;  Location: [facility];  Service: Cardiopulmonary;  Laterality: Bilateral;   Social History:  reports that she has been smoking cigarettes. She has a 1.25 pack-year smoking history. She has never used smokeless tobacco. She reports that she does not currently use alcohol. She reports that she does not currently use drugs.  Allergies  Allergen Reactions   Acetaminophen Diarrhea and Nausea And Vomiting   Nsaids Diarrhea and Nausea And Vomiting   Other Hives and Itching    Reaction to tree nuts   Peanuts [Peanut Oil] Hives and Itching   Triamterene-Hctz Nausea Only and Other (See Comments)    dizzy    Family History  Problem Relation Age of Onset   Heart failure Mother    Colon polyps Mother    Heart failure Father    Diabetes Father    Heart failure Maternal Grandfather  Colon cancer Neg Hx     Prior to Admission medications   Medication Sig Start Date End Date Taking? Authorizing Provider  albuterol (VENTOLIN HFA) 108 (90 Base) MCG/ACT inhaler 2 puffs every 4 hours as needed for coughing or wheezing. Patient taking differently: Inhale 2 puffs into the lungs every 4 (four) hours as needed for wheezing or shortness of breath (cough). 06/04/19  Yes Ambs, Norvel Richards, FNP  BREO ELLIPTA 200-25 MCG/INH AEPB INHALE 1 PUFF INTO THE LUNGS DAILY Patient taking differently: Inhale 1 puff  into the lungs daily as needed (shortness of breath/wheezing). 05/28/19  Yes Kozlow, Alvira Philips, MD  spironolactone (ALDACTONE) 25 MG tablet Take 25 mg by mouth daily as needed (leg swelling). 11/14/19  Yes [provider]  Cholecalciferol (VITAMIN D PO) Take 1,000 Units by mouth daily.    [provider]  DUPIXENT 300 MG/2ML prefilled syringe INJECT 300 MG SUBCUTANEOUSLY EVERY OTHER WEEK 06/02/20   Kozlow, Alvira Philips, MD  EPINEPHrine (EPIPEN 2-PAK) 0.3 mg/0.3 mL IJ SOAJ injection Inject 0.3 mLs (0.3 mg total) into the muscle once as needed (for severe allergic reaction). 07/11/18   Kozlow, Alvira Philips, MD  gabapentin (NEURONTIN) 300 MG capsule TAKE 3 CAPSULES BY MOUTH TWICE DAILY 12/27/18   Sater, Pearletha Furl, MD  lipase/protease/amylase (CREON) 36000 UNITS CPEP capsule Take 72,000 Units by mouth 3 (three) times daily with meals. 04/28/20   [provider]  magnesium oxide (MAGOX 400) 400 (240 Mg) MG tablet Take 1 tablet (400 mg total) by mouth daily. Patient not taking: Reported on 11/04/2021 12/07/20   Ernie Avena, MD  Melatonin 10 MG TABS Take 10 mg by mouth at bedtime.    [provider]  metoprolol succinate (TOPROL-XL) 50 MG 24 hr tablet Take 1 tablet (50 mg total) by mouth daily. Pt needs to keep upcoming appt in Nov for 90 day refills 02/17/21   Wendall Stade, MD  montelukast (SINGULAIR) 10 MG tablet TAKE ONE TABLET BY MOUTH AT BEDTIME 12/13/19   Ambs, Norvel Richards, FNP  Multiple Vitamins-Minerals (MULTIVITAMIN WITH MINERALS) tablet Take 1 tablet by mouth daily.    [provider]  naloxone [facility]) nasal spray 4 mg/0.1 mL SMARTSIG:Both Nares 04/22/21   [provider]  omeprazole (PRILOSEC) 40 MG capsule TAKE 1 CAPSULE(40 MG) BY MOUTH DAILY 01/08/20   Ambs, Norvel Richards, FNP  ondansetron (ZOFRAN-ODT) 8 MG disintegrating tablet Take 8 mg by mouth 3 (three) times daily as needed. 04/09/21   [provider]  oxyCODONE (ROXICODONE) 15 MG immediate release tablet Take  15 mg by mouth 3 (three) times daily. As needed 01/06/21   [provider]  polyethylene glycol (MIRALAX / GLYCOLAX) 17 g packet Take 17 g by mouth daily as needed for mild constipation or moderate constipation. Patient not taking: Reported on 11/04/2021 08/10/20   Joycelyn Das, MD  QUEtiapine (SEROQUEL) 100 MG tablet Take 100 mg by mouth at bedtime. 03/23/19   [provider]  rosuvastatin (CRESTOR) 10 MG tablet Take 10 mg by mouth daily.    [provider]  sertraline (ZOLOFT) 100 MG tablet Take 1 tablet (100 mg total) by mouth daily. 04/23/21   Sater, Pearletha Furl, MD  topiramate (TOPAMAX) 100 MG tablet Take 1 tablet (100 mg total) by mouth 2 (two) times daily. 10/28/21   Asa Lente, MD    Physical Exam: Vitals:   11/04/21 1030 11/04/21 1045 11/04/21 1100 11/04/21 1103  BP: 104/66  (!) 110/91   Pulse: 82  81 75   Resp: 13 19 (!) 34   Temp:    98.6 F (37 C)  TempSrc:    Oral  SpO2: 100% 97% 99%    General: 36 y.o. female resting in bed in NAD Eyes: PERRL, normal sclera ENMT: Nares patent w/o discharge, orophaynx clear, dentition normal, ears w/o discharge/lesions/ulcers Neck: Supple, trachea midline Cardiovascular: RRR, +S1, S2, no m/g/r, equal pulses throughout Respiratory: CTABL, no w/r/r, normal WOB GI: BS+, NDNT, no masses noted, no organomegaly noted MSK: No c/c; RLE bandaging CDI; erythema and swelling of RLE Neuro: A&O x 3, no focal deficits Psyc: Appropriate interaction and affect, calm/cooperative  Data Reviewed:  Lab Results  Component Value Date   NA 137 11/04/2021   K 2.8 (L) 11/04/2021   CO2 26 11/04/2021   GLUCOSE 129 (H) 11/04/2021   BUN <5 (L) 11/04/2021   CREATININE 0.64 11/04/2021   CALCIUM 8.1 (L) 11/04/2021   GFRNONAA >60 11/04/2021   Lab Results  Component Value Date   WBC 5.9 11/04/2021   HGB 12.1 11/04/2021   HCT 36.3 11/04/2021   MCV 97.1 11/04/2021   PLT 142 (L) 11/04/2021   XR Right knee complete 1. No acute  osseous abnormality. 2. Small joint effusion and soft tissue swelling.  Assessment and Plan: Sepsis secondary to RLE cellulitis/Right knee septic bursitis     - admit to inpt, tele     - vanc, rocephin, fluids     - Orthopedics onboard, appreciate assistance; I&D'd at beside, ortho attending to see her later     - follow Bld cultures, fluid cultures  Hypokalemia     - replace k+, check Mg2+  Elevated LFTs     - check hepatitis panel     - check RUQ [place]  Hx of HTN     - hypotensive at presentation; hold home regimen  Asthma Tobacco abuse     - counsel against further use     - continue home regimen  Chronic Diastolic HF     - hold diuretic as she is receiving fluid resuscitation  Chronic pancreatitis      - continue home regimen  HLD     - Hold home regimen d/t elevated LFTs  Seizure d/o     - continue home regimen  Advance Care Planning:   Code Status: FULL  Consults: Orthopedics  Family Communication: None at bedside  Severity of Illness: The appropriate patient status for this patient is INPATIENT. Inpatient status is judged to be reasonable and necessary in order to provide the required intensity of service to ensure the patient's safety. The patient's presenting symptoms, physical exam findings, and initial radiographic and laboratory data in the context of their chronic comorbidities is felt to place them at high risk for further clinical deterioration. Furthermore, it is not anticipated that the patient will be medically stable for discharge from the hospital within 2 midnights of admission.   * I certify that at the point of admission it is my clinical judgment that the patient will require inpatient hospital care spanning beyond 2 midnights from the point of admission due to high intensity of service, high risk for further deterioration and high frequency of surveillance required.*  Author: Teddy Spike, DO 11/04/2021 11:42 AM  For on call review  [email or web address].

## 2021-11-04 NOTE — Procedures (Addendum)
Procedure: Right knee bursa I&D   Indication: Right knee septic bursitis   Surgeon: Charma Igo, PA-C, Gershon Mussel, M.D. supervisor   Assist: None   Anesthesia: 35ml 1% plain lidocaine   EBL: None   Complications: None   Findings: After risks/benefits explained patient desires to undergo procedure. Consent obtained and time out performed. The right knee was sterilely prepped and draped. A 3cm incision (later extended to 4) was carried about over bursa. Moderate coagulated hematoma encountered and evacuated. Wound explored with hemostat and digitally then irrigated with 1L NS. Wound packed with 1/4" iodoform gauze and dressed. Pt tolerated the procedure well.       Freeman Caldron, PA-C Orthopedic Surgery [phone]

## 2021-11-04 NOTE — Progress Notes (Signed)
Right lower extremity venous duplex completed. Refer to "CV Proc" under chart review to view preliminary results.  11/04/2021 9:20 AM Eula Fried., MHA, RVT, RDCS, RDMS

## 2021-11-05 ENCOUNTER — Other Ambulatory Visit: Payer: Self-pay

## 2021-11-05 DIAGNOSIS — L03115 Cellulitis of right lower limb: Secondary | ICD-10-CM | POA: Diagnosis not present

## 2021-11-05 LAB — PROTIME-INR
INR: 1.3 — ABNORMAL HIGH (ref 0.8–1.2)
Prothrombin Time: 15.9 seconds — ABNORMAL HIGH (ref 11.4–15.2)

## 2021-11-05 LAB — COMPREHENSIVE METABOLIC PANEL
ALT: 44 U/L (ref 0–44)
AST: 198 U/L — ABNORMAL HIGH (ref 15–41)
Albumin: 2.5 g/dL — ABNORMAL LOW (ref 3.5–5.0)
Alkaline Phosphatase: 355 U/L — ABNORMAL HIGH (ref 38–126)
Anion gap: 9 (ref 5–15)
BUN: <5 mg/dL — ABNORMAL LOW (ref 6–20)
CO2: 23 mmol/L (ref 22–32)
Calcium: 8.2 mg/dL — ABNORMAL LOW (ref 8.9–10.3)
Chloride: 107 mmol/L (ref 98–111)
Creatinine, Ser: 0.55 mg/dL (ref 0.44–1.00)
GFR, Estimated: >60 mL/min (ref >60)
Glucose, Bld: 117 mg/dL — ABNORMAL HIGH (ref 70–99)
Potassium: 3.5 mmol/L (ref 3.5–5.1)
Sodium: 139 mmol/L (ref 135–145)
Total Bilirubin: 2.4 mg/dL — ABNORMAL HIGH (ref 0.3–1.2)
Total Protein: 6 g/dL — ABNORMAL LOW (ref 6.5–8.1)

## 2021-11-05 LAB — MRSA NEXT GEN BY PCR, NASAL: MRSA by PCR Next Gen: NOT DETECTED

## 2021-11-05 LAB — PROCALCITONIN: Procalcitonin: 0.28 ng/mL

## 2021-11-05 LAB — CBC
HCT: 35.7 % — ABNORMAL LOW (ref 36.0–46.0)
Hemoglobin: 11.5 g/dL — ABNORMAL LOW (ref 12.0–15.0)
MCH: 31.9 pg (ref 26.0–34.0)
MCHC: 32.2 g/dL (ref 30.0–36.0)
MCV: 99.2 fL (ref 80.0–100.0)
Platelets: 91 10*3/uL — ABNORMAL LOW (ref 150–400)
RBC: 3.6 MIL/uL — ABNORMAL LOW (ref 3.87–5.11)
RDW: 14.6 % (ref 11.5–15.5)
WBC: 4.6 10*3/uL (ref 4.0–10.5)
nRBC: 0 % (ref 0.0–0.2)

## 2021-11-05 LAB — CORTISOL-AM, BLOOD: Cortisol - AM: 7.8 ug/dL (ref 6.7–22.6)

## 2021-11-05 MED ORDER — NALOXONE HCL 0.4 MG/ML IJ SOLN
0.4000 mg | INTRAMUSCULAR | Status: DC | PRN
Start: 2021-11-05 — End: 2021-11-07

## 2021-11-05 MED ORDER — MAGNESIUM SULFATE 2 GM/50ML IV SOLN
2.0000 g | Freq: Once | INTRAVENOUS | Status: AC
  Administered 2021-11-05: 2 g via INTRAVENOUS
  Filled 2021-11-05: qty 50

## 2021-11-05 MED ORDER — OXYCODONE HCL 5 MG PO TABS
15.0000 mg | ORAL_TABLET | Freq: Three times a day (TID) | ORAL | Status: DC | PRN
  Administered 2021-11-06: 10 mg via ORAL
  Filled 2021-11-05 (×2): qty 3

## 2021-11-05 MED ORDER — HYDROMORPHONE HCL 1 MG/ML IJ SOLN: 0.5000 mg | INTRAMUSCULAR | Status: DC | PRN

## 2021-11-05 MED ORDER — HYDROMORPHONE HCL 1 MG/ML IJ SOLN
0.5000 mg | Freq: Four times a day (QID) | INTRAMUSCULAR | Status: DC | PRN
  Administered 2021-11-05 – 2021-11-07 (×8): 0.5 mg via INTRAVENOUS
  Filled 2021-11-05 (×8): qty 0.5

## 2021-11-05 MED ORDER — POTASSIUM CHLORIDE 20 MEQ PO PACK
40.0000 meq | PACK | Freq: Once | ORAL | Status: AC
  Administered 2021-11-05: 40 meq via ORAL
  Filled 2021-11-05: qty 2

## 2021-11-05 MED ORDER — VANCOMYCIN HCL 750 MG/150ML IV SOLN
750.0000 mg | Freq: Two times a day (BID) | INTRAVENOUS | Status: AC
  Administered 2021-11-06 (×3): 750 mg via INTRAVENOUS
  Filled 2021-11-05 (×3): qty 150

## 2021-11-05 NOTE — Progress Notes (Signed)
  Transition of Care [facility]) Screening Note   Patient Details  Name: Kara [name] Date of Birth: [birth year]   Transition of Care (TOC) CM/SW Contact:    Amada Jupiter, LCSW Phone Number: 11/05/2021, 10:08 AM    Transition of Care Department [facility]) has reviewed patient and no TOC needs have been identified at this time. We will continue to monitor patient advancement through interdisciplinary progression rounds. If new patient transition needs arise, please place a TOC consult.

## 2021-11-05 NOTE — Progress Notes (Signed)
Patient seen today.  Feels better overall. Right leg and knee also look much better today. Wound looks good and as expected. Painless ROM of the knee. Follow up cultures.  Floor RN may pull packing Friday and begin wet to dry. Follow up as outpatient in 1 week  N. Glee Arvin, MD [facility] 2:26 PM

## 2021-11-05 NOTE — Progress Notes (Addendum)
PROGRESS NOTE    Kara [name]  ZOX:[id number] DOB: 07/09/1985 DOA: 11/04/2021 PCP: Lawerance Cruel, MD     Brief Narrative:   H/o seizures,  chronic pancreatitis, chronic pain, asthma, tobacco abuse, chronic diastolic HF. Presenting with  right knee hematoma after a  Fall at work , possible septic bursitis, s/p I&D at bedside w/ ortho PA, f/u on culture result, wound care teaching, possible d/c in 24-48hrs.   Subjective:  Reports right knee pain, wants analgesics to be adjusted No fever, does not appear septic Bp low normal   Assessment & Plan:  Principal Problem:   Cellulitis of right lower extremity Active Problems:   Hypokalemia   Elevated LFTs   Chronic diastolic heart failure (HCC)   Polyneuropathy   Chronic pancreatitis (HCC)   Benign essential HTN   Sepsis (HCC)   Tobacco abuse   Asthma, severe persistent, well-controlled   Septic prepatellar bursitis of right knee    Assessment and Plan:   Cellulitis right lower extremity/ right knee septic bursitis/infected hematoma? -Reports seen at [facility] and was given kefelex 590m tid for 7 days, she took for three days prior to coming to the hospital -right knee x ray: 1. No acute osseous abnormality. 2. Small joint effusion and soft tissue swelling -venous doppler no DVT -s/p I/D by ortho on 7/19 -MRSA screening negative, blood culture no growth, urine culture in process -she does not appear septic, but bp low normal, with significant lactic acidosis, elevated ESR CRP, abdominal lft - all could due to infection, hold beta-blocker -continue iv abx for now, f/u on culture result, continue analgesics   Hypokalemia/hypomagnesemia Replace  LFT elevation Possibly from cellulitis  Hepatitis panel negative Abdominal ultrasound showed hepatosteatosis, no other acute findings check CK Repeat LFT in the morning Report prior history of alcohol use, has been in remission for years since 2017 Hold statin for now  Chronic  Diastolic HF     - hold diuretic as she is receiving fluid resuscitation -Appear to be on spironolactone at home  HTN, bp low normal  Hold beta-blocker     HLD     - Hold home regimen d/t elevated LFTs   Chronic pancreatitis      - continue home regimen  Seizure , last was around [place]      - continue home regimen    : Body mass index is 34.61 kg/m..Marland Kitchen Meet obesity criteria  .      I have Reviewed nursing notes, Vitals, pain scores, I/o's, Lab results and  imaging results since pt's last encounter, details please see discussion above  I ordered the following labs:  Unresulted Labs (From admission, onward)     Start     Ordered   11/06/21 0500  CBC with Differential/Platelet  Tomorrow morning,   R        11/05/21 0801   11/06/21 0500  Comprehensive metabolic panel  Tomorrow morning,   R        11/05/21 0801   11/06/21 0500  Magnesium  Tomorrow morning,   R        11/05/21 0801   11/05/21 0801  MRSA Next Gen by PCR, Nasal  Once,   R        11/05/21 0800             DVT prophylaxis: SCDs Start: 11/04/21 1305   Code Status:   Code Status: Full Code  Family Communication: None at bedside Disposition:   Dispo: The  patient is from: Home              Anticipated d/c is to: Home              Anticipated d/c date is: Need clearance by Ortho, need to follow-up culture result, need to teach wound care /dressing changes  Antimicrobials:    Anti-infectives (From admission, onward)    Start     Dose/Rate Route Frequency Ordered Stop   11/05/21 2200  vancomycin (VANCOREADY) IVPB 750 mg/150 mL        750 mg 150 mL/hr over 60 Minutes Intravenous Every 12 hours 11/05/21 0959     11/04/21 2200  vancomycin (VANCOCIN) IVPB 1000 mg/200 mL premix  Status:  Discontinued        1,000 mg 200 mL/hr over 60 Minutes Intravenous Every 12 hours 11/04/21 1317 11/05/21 0959   11/04/21 1400  cefTRIAXone (ROCEPHIN) 2 g in sodium chloride 0.9 % 100 mL IVPB        2 g 200 mL/hr  over 30 Minutes Intravenous Every 24 hours 11/04/21 1308 11/11/21 1359   11/04/21 1100  vancomycin (VANCOREADY) IVPB 2000 mg/400 mL        2,000 mg 200 mL/hr over 120 Minutes Intravenous  Once 11/04/21 1041 11/04/21 1303   11/04/21 1000  vancomycin (VANCOREADY) IVPB 2000 mg/400 mL  Status:  Discontinued        2,000 mg 200 mL/hr over 120 Minutes Intravenous  Once 11/04/21 0941 11/04/21 0942          Objective: Vitals:   11/04/21 1900 11/04/21 2200 11/05/21 0212 11/05/21 0519  BP: 123/88 123/65 95/70 100/67  Pulse: 73 78 80 75  Resp: _0 Temp: 98.1 F (36.7 C) 98.6 F (37 C) 98.3 F (36.8 C) 98.4 F (36.9 C)  TempSrc: Oral Oral Oral Oral  SpO2: 99%  99% 99%  Weight:  94.3 kg    Height:  _1  (1.651 m)      Intake/Output Summary (Last 24 hours) at 11/05/2021 0803 Last data filed at 11/05/2021 0600 Gross per 24 hour  Intake 3900 ml  Output --  Net 3900 ml   Filed Weights   11/04/21 2200  Weight: 94.3 kg    Examination:  General exam: alert, awake, communicative,calm, NAD Respiratory system: Clear to auscultation. Respiratory effort normal. Cardiovascular system:  RRR.  Gastrointestinal system: Abdomen is nondistended, soft and nontender.  Normal bowel sounds heard. Central nervous system: Alert and oriented. No focal neurological deficits. Extremities:  right knee wrapped, right lower extremity erythema/edema Skin: No rashes, lesions or ulcers Psychiatry: Judgement and insight appear normal. Mood & affect appropriate.     Data Reviewed: I have personally reviewed  labs and visualized  imaging studies since the last encounter and formulate the plan        Scheduled Meds:  fluticasone furoate-vilanterol  1 puff Inhalation Daily   gabapentin  900 mg Oral BID   lipase/protease/amylase  72,000 Units Oral TID WC   melatonin  10 mg Oral QHS   montelukast  10 mg Oral QHS   multivitamin with minerals  1 tablet Oral Daily   pantoprazole  40 mg Oral QHS    potassium chloride  40 mEq Oral Once   QUEtiapine  100 mg Oral QHS   sertraline  100 mg Oral Daily   topiramate  100 mg Oral BID   Continuous Infusions:  cefTRIAXone (ROCEPHIN)  IV Stopped (11/04/21 1439)   magnesium sulfate  bolus IVPB     vancomycin 1,000 mg (11/04/21 2236)     LOS: 1 day     Florencia Reasons, MD PhD FACP [facility]  Available via Epic secure chat 7am-7pm for nonurgent issues Please page for urgent issues To page the attending provider between 7A-7P or the covering provider during after hours 7P-7A, please log into the web site [email or web address] and access using universal [place] password for that web site. If you do not have the password, please call the hospital operator.    11/05/2021, 8:03 AM

## 2021-11-05 NOTE — Progress Notes (Signed)
Pharmacy Antibiotic Note  Kara [name] is a 36 y.o. female s/p fall two weeks PTA who presented to the ED on 11/04/2021 with c/o worsening of right knee pain and swelling.  Right knee xray on 11/04/21 showed small joint effusion and soft tissue swelling.  She was diagnosed with right knee septic bursitis and underwent I&D in the ED.  She's currently on vancomycin and ceftriaxone for infection.  Today, 11/05/2021: - day #2 abx -  scr 0.55 (crcl~100) - afeb, wbc wnl - all cultures have been negative thus far - PCT 0.28   Plan: - continue ceftriaxone 2gm q24h - adjust vancomycin to 750 mg IV q12h for est AUC 418  __________________________________  Height: 5\' 5"  (165.1 cm) Weight: 94.3 kg (208 lb) IBW/kg (Calculated) : 57  Temp (24hrs), Avg:98.5 F (36.9 C), Min:98.1 F (36.7 C), Max:98.9 F (37.2 C)  Recent Labs  Lab 11/04/21 0805 11/04/21 0943 11/05/21 0331  WBC 5.9  --  4.6  CREATININE 0.64  --  0.55  LATICACIDVEN 3.5* 4.2*  --     Estimated Creatinine Clearance: 109.3 mL/min (by C-G formula based on SCr of 0.55 mg/dL).    Allergies  Allergen Reactions   Acetaminophen Diarrhea and Nausea And Vomiting   Nsaids Diarrhea and Nausea And Vomiting   Other Hives and Itching    Reaction to tree nuts   Peanuts [Peanut Oil] Hives and Itching   Triamterene-Hctz Nausea Only and Other (See Comments)    dizzy   7/19 Vanc >>> 7/19 CTX>>  7/19 BCx2:  7/19 R bursa/abscess fluid:   Thank you for allowing pharmacy to be a part of this patient's care.  8/19 11/05/2021 9:51 AM

## 2021-11-05 NOTE — Plan of Care (Signed)
  Problem: Activity: Goal: Risk for activity intolerance will decrease Outcome: Progressing   Problem: Pain Managment: Goal: General experience of comfort will improve Outcome: Progressing   

## 2021-11-06 DIAGNOSIS — L03115 Cellulitis of right lower limb: Secondary | ICD-10-CM | POA: Diagnosis not present

## 2021-11-06 LAB — CBC WITH DIFFERENTIAL/PLATELET
Abs Immature Granulocytes: 0.02 10*3/uL (ref 0.00–0.07)
Basophils Absolute: 0.1 10*3/uL (ref 0.0–0.1)
Basophils Relative: 1 %
Eosinophils Absolute: 0.2 10*3/uL (ref 0.0–0.5)
Eosinophils Relative: 4 %
HCT: 35.1 % — ABNORMAL LOW (ref 36.0–46.0)
Hemoglobin: 11.2 g/dL — ABNORMAL LOW (ref 12.0–15.0)
Immature Granulocytes: 0 %
Lymphocytes Relative: 30 %
Lymphs Abs: 1.6 10*3/uL (ref 0.7–4.0)
MCH: 31.8 pg (ref 26.0–34.0)
MCHC: 31.9 g/dL (ref 30.0–36.0)
MCV: 99.7 fL (ref 80.0–100.0)
Monocytes Absolute: 0.4 10*3/uL (ref 0.1–1.0)
Monocytes Relative: 7 %
Neutro Abs: 3.2 10*3/uL (ref 1.7–7.7)
Neutrophils Relative %: 58 %
Platelets: 100 10*3/uL — ABNORMAL LOW (ref 150–400)
RBC: 3.52 MIL/uL — ABNORMAL LOW (ref 3.87–5.11)
RDW: 14.6 % (ref 11.5–15.5)
WBC: 5.4 10*3/uL (ref 4.0–10.5)
nRBC: 0 % (ref 0.0–0.2)

## 2021-11-06 LAB — COMPREHENSIVE METABOLIC PANEL
ALT: 38 U/L (ref 0–44)
AST: 131 U/L — ABNORMAL HIGH (ref 15–41)
Albumin: 2.7 g/dL — ABNORMAL LOW (ref 3.5–5.0)
Alkaline Phosphatase: 348 U/L — ABNORMAL HIGH (ref 38–126)
Anion gap: 9 (ref 5–15)
BUN: <5 mg/dL — ABNORMAL LOW (ref 6–20)
CO2: 24 mmol/L (ref 22–32)
Calcium: 8.6 mg/dL — ABNORMAL LOW (ref 8.9–10.3)
Chloride: 106 mmol/L (ref 98–111)
Creatinine, Ser: 0.58 mg/dL (ref 0.44–1.00)
GFR, Estimated: >60 mL/min (ref >60)
Glucose, Bld: 149 mg/dL — ABNORMAL HIGH (ref 70–99)
Potassium: 4.1 mmol/L (ref 3.5–5.1)
Sodium: 139 mmol/L (ref 135–145)
Total Bilirubin: 2 mg/dL — ABNORMAL HIGH (ref 0.3–1.2)
Total Protein: 6.4 g/dL — ABNORMAL LOW (ref 6.5–8.1)

## 2021-11-06 LAB — LACTIC ACID, PLASMA: Lactic Acid, Venous: 1.8 mmol/L (ref 0.5–1.9)

## 2021-11-06 LAB — MAGNESIUM: Magnesium: 1.8 mg/dL (ref 1.7–2.4)

## 2021-11-06 LAB — CK: Total CK: 69 U/L (ref 38–234)

## 2021-11-06 MED ORDER — CEFADROXIL 500 MG PO CAPS
1000.0000 mg | ORAL_CAPSULE | Freq: Two times a day (BID) | ORAL | Status: DC
  Administered 2021-11-07: 1000 mg via ORAL
  Filled 2021-11-06: qty 2

## 2021-11-06 MED ORDER — DOXYCYCLINE HYCLATE 100 MG PO TABS
100.0000 mg | ORAL_TABLET | Freq: Two times a day (BID) | ORAL | Status: DC
  Administered 2021-11-07: 100 mg via ORAL
  Filled 2021-11-06: qty 1

## 2021-11-06 MED ORDER — FLUTICASONE FUROATE-VILANTEROL 200-25 MCG/ACT IN AEPB
1.0000 | INHALATION_SPRAY | Freq: Every day | RESPIRATORY_TRACT | Status: DC
  Administered 2021-11-06 – 2021-11-07 (×2): 1 via RESPIRATORY_TRACT
  Filled 2021-11-06: qty 28

## 2021-11-06 NOTE — Evaluation (Signed)
Physical Therapy One Time Evaluation Patient Details Name: Kara [name] MRN: [id number] DOB: Aug 23, 1985 Today's Date: 11/06/2021  History of Present Illness  Pt is a 36 year old female presenting with right knee hematoma after a fall at work, possible septic bursitis, s/p Right knee bursa I&D on 11/04/21 bedside w/ ortho PA.  PMHx: chronic pain, asthma, tobacco abuse, chronic diastolic HF, chronic pancreatitis.  Clinical Impression  Patient evaluated by Physical Therapy with no further acute PT needs identified. All education has been completed and the patient has no further questions.  Pt with antalgic gait however able to ambulate good distance and practice safe stair technique.  Pt would benefit from RW at home for pain control and promoting safe healing. See below for any follow-up Physical Therapy or equipment needs. PT is signing off. Thank you for this referral.        Recommendations for follow up therapy are one component of a multi-disciplinary discharge planning process, led by the attending physician.  Recommendations may be updated based on patient status, additional functional criteria and insurance authorization.  Follow Up Recommendations Follow physician's recommendations for discharge plan and follow up therapies      Assistance Recommended at Discharge PRN  Patient can return home with the following       Equipment Recommendations Rolling walker (2 wheels)  Recommendations for Other Services       Functional Status Assessment Patient has had a recent decline in their functional status and demonstrates the ability to make significant improvements in function in a reasonable and predictable amount of time.     Precautions / Restrictions Precautions Precautions: None Restrictions Weight Bearing Restrictions: No      Mobility  Bed Mobility Overal bed mobility: Modified Independent                  Transfers Overall transfer level: Modified independent                       Ambulation/Gait Ambulation/Gait assistance: Supervision Gait Distance (Feet): 160 Feet Assistive device: None Gait Pattern/deviations: Step-to pattern, Decreased stance time - right, Decreased weight shift to right, Antalgic       General Gait Details: pt preferred to try ambulating without assistive device and observed very antalgic gait pattern, recommended using RW upon d/c for pain control and healing and pt agreeable  Stairs Stairs: Yes Stairs assistance: Min guard Stair Management: Forwards, Step to pattern Number of Stairs: 3 General stair comments: pt does not have rails, prefers to have spouse assist so therapist was stand in for spouse (not present), pt educated on spouse placement and hand hold for assisting as well as using gait belt  Wheelchair Mobility    Modified Rankin (Stroke Patients Only)       Balance                                             Pertinent Vitals/Pain Pain Assessment Pain Assessment: Faces Faces Pain Scale: Hurts a little bit Pain Location: right knee Pain Descriptors / Indicators: Guarding, Sore Pain Intervention(s): Monitored during session, Repositioned    Home Living Family/patient expects to be discharged to:: Private residence Living Arrangements: Spouse/significant other   Type of Home: House Home Access: Stairs to enter Entrance Stairs-Rails: None Entrance Stairs-Number of Steps: 2   Home Layout: One level Home Equipment:  None      Prior Function Prior Level of Function : Independent/Modified Independent                     Hand Dominance        Extremity/Trunk Assessment        Lower Extremity Assessment Lower Extremity Assessment: RLE deficits/detail RLE Deficits / Details: swollen right knee, discussed safe use of ice upon d/c, pt with limited resting extension and flexion however able to use functionally       Communication   Communication: No  difficulties  Cognition Arousal/Alertness: Awake/alert Behavior During Therapy: WFL for tasks assessed/performed Overall Cognitive Status: Within Functional Limits for tasks assessed                                          General Comments      Exercises     Assessment/Plan    PT Assessment Patient does not need any further PT services  PT Problem List         PT Treatment Interventions      PT Goals (Current goals can be found in the Care Plan section)  Acute Rehab PT Goals PT Goal Formulation: All assessment and education complete, DC therapy    Frequency       Co-evaluation               AM-PAC PT "6 Clicks" Mobility  Outcome Measure Help needed turning from your back to your side while in a flat bed without using bedrails?: None Help needed moving from lying on your back to sitting on the side of a flat bed without using bedrails?: None Help needed moving to and from a bed to a chair (including a wheelchair)?: None Help needed standing up from a chair using your arms (e.g., wheelchair or bedside chair)?: A Little Help needed to walk in hospital room?: A Little Help needed climbing 3-5 steps with a railing? : A Little 6 Click Score: 21    End of Session Equipment Utilized During Treatment: Gait belt Activity Tolerance: Patient tolerated treatment well Patient left: in bed;with call bell/phone within reach   PT Visit Diagnosis: Difficulty in walking, not elsewhere classified (R26.2)    Time: 3710-6269 PT Time Calculation (min) (ACUTE ONLY): 11 min   Charges:   PT Evaluation $PT Eval Low Complexity: 1 Low        Kara [name] PT, DPT Physical Therapist Acute Rehabilitation Services Preferred contact method: Secure Chat Weekend Pager Only: [phone] Office: [phone]   Kara [name] [place] 11/06/2021, 12:03 PM

## 2021-11-06 NOTE — TOC Progression Note (Signed)
Transition of Care [facility]) - Progression Note    Patient Details  Name: Kara [name] MRN: [id number] Date of Birth: 1985/05/10  Transition of [facility]) CM/SW Contact  Amada Jupiter, LCSW Phone Number: 11/06/2021, 10:24 AM  Clinical Narrative:    Alerted by PT that pt needing RW - pt agreeable and has no agency preference.  Placed order with [employer] for delivery to pt's room.        Expected Discharge Plan and Services                                                 Social Determinants of Health (SDOH) Interventions    Readmission Risk Interventions    11/06/2021   10:24 AM 10/31/2019   12:40 PM  Readmission Risk Prevention Plan  Transportation Screening Complete Complete  PCP or Specialist Appt within 5-7 Days Complete   PCP or Specialist Appt within 3-5 Days  Complete  Home Care Screening Complete   Medication Review (RN CM) Complete   HRI or Home Care Consult  Not Complete  Social Work Consult for Recovery Care Planning/Counseling  Not Complete  Palliative Care Screening  Not Applicable  Medication Review [employer])  Complete

## 2021-11-06 NOTE — Progress Notes (Signed)
PROGRESS NOTE    Kara [name]  ZOX:[id number] DOB: 1985-11-17 DOA: 11/04/2021 PCP: Lawerance Cruel, MD     Brief Narrative:   H/o seizures,  chronic pancreatitis, chronic pain, asthma, tobacco abuse, chronic diastolic HF. Presenting with  right knee hematoma after a  Fall at work , possible septic bursitis, s/p I&D at bedside w/ ortho PA, f/u on culture result, wound care teaching, possible d/c in 24-48hrs.   Subjective:  Improving, less right lower extremity edema/erythema  No fever, does not appear septic Bp low normal   Assessment & Plan:  Principal Problem:   Cellulitis of right lower extremity Active Problems:   Hypokalemia   Elevated LFTs   Chronic diastolic heart failure (HCC)   Polyneuropathy   Chronic pancreatitis (HCC)   Benign essential HTN   Sepsis (HCC)   Tobacco abuse   Asthma, severe persistent, well-controlled   Septic prepatellar bursitis of right knee    Assessment and Plan:   Cellulitis right lower extremity/ right knee septic bursitis/infected hematoma? -Reports seen at [facility] and was given kefelex $RemoveBefor'500mg'RBcIoSYFEOWJ$  tid for 7 days, she took for three days prior to coming to the hospital -right knee x ray: 1. No acute osseous abnormality. 2. Small joint effusion and soft tissue swelling -venous doppler no DVT -s/p I/D by ortho on 7/19 -MRSA screening negative, blood culture no growth, wound culture in process -she does not appear septic, but bp low normal, with significant lactic acidosis, elevated ESR CRP, abdominal lft on presentation- all could due to infection, hold beta-blocker -continue iv abx for now, f/u on culture result, continue analgesics   Hypokalemia/hypomagnesemia Replaced, improved   LFT elevation, Possibly from cellulitis  Hepatitis panel negative Abdominal ultrasound showed hepatosteatosis, no other acute findings CK unremarkable Report prior history of alcohol use, has been in remission for years since 2017 Hold statin for now Trend  LFT  Chronic Diastolic HF     - hold diuretic , does not appear to be grossly volume overloaded -Appear to be on spironolactone at home  HTN, bp low normal  Hold beta-blocker     HLD     - Hold home regimen d/t elevated LFTs   Chronic pancreatitis      - continue home regimen  Seizure , last was around [place]      - continue home regimen    : Body mass index is 34.61 kg/m.Marland Kitchen  Meet obesity criteria  .      I have Reviewed nursing notes, Vitals, pain scores, I/o's, Lab results and  imaging results since pt's last encounter, details please see discussion above  I ordered the following labs:  Unresulted Labs (From admission, onward)     Start     Ordered   11/07/21 0500  CBC with Differential/Platelet  Tomorrow morning,   R        11/06/21 1540   11/07/21 0500  Comprehensive metabolic panel  Tomorrow morning,   R        11/06/21 1540   11/07/21 0500  Magnesium  Tomorrow morning,   R        11/06/21 1540             DVT prophylaxis: SCDs Start: 11/04/21 1305   Code Status:   Code Status: Full Code  Family Communication: None at bedside Disposition:   Dispo: The patient is from: Home              Anticipated d/c is to: Home  Anticipated d/c date is:  need to follow-up culture result, need to teach wound care /dressing changes, possible discharge in the morning  Antimicrobials:    Anti-infectives (From admission, onward)    Start     Dose/Rate Route Frequency Ordered Stop   11/07/21 1000  doxycycline (VIBRA-TABS) tablet 100 mg        100 mg Oral Every 12 hours 11/06/21 1350     11/07/21 1000  cefadroxil (DURICEF) capsule 1,000 mg        1,000 mg Oral 2 times daily 11/06/21 1350     11/05/21 2200  vancomycin (VANCOREADY) IVPB 750 mg/150 mL        750 mg 150 mL/hr over 60 Minutes Intravenous Every 12 hours 11/05/21 0959 11/07/21 0959   11/04/21 2200  vancomycin (VANCOCIN) IVPB 1000 mg/200 mL premix  Status:  Discontinued        1,000  mg 200 mL/hr over 60 Minutes Intravenous Every 12 hours 11/04/21 1317 11/05/21 0959   11/04/21 1400  cefTRIAXone (ROCEPHIN) 2 g in sodium chloride 0.9 % 100 mL IVPB        2 g 200 mL/hr over 30 Minutes Intravenous Every 24 hours 11/04/21 1308 11/06/21 1449   11/04/21 1100  vancomycin (VANCOREADY) IVPB 2000 mg/400 mL        2,000 mg 200 mL/hr over 120 Minutes Intravenous  Once 11/04/21 1041 11/04/21 1303   11/04/21 1000  vancomycin (VANCOREADY) IVPB 2000 mg/400 mL  Status:  Discontinued        2,000 mg 200 mL/hr over 120 Minutes Intravenous  Once 11/04/21 0941 11/04/21 0942          Objective: Vitals:   11/05/21 2018 11/06/21 0430 11/06/21 0801 11/06/21 1309  BP: 107/72 108/80  102/78  Pulse: 75 66  71  Resp: $Remo'16 18  18  'wScZv$ Temp: 97.9 F (36.6 C) 98.1 F (36.7 C)  98.1 F (36.7 C)  TempSrc: Oral Oral  Oral  SpO2: 100% 100% 100% 98%  Weight:      Height:        Intake/Output Summary (Last 24 hours) at 11/06/2021 1543 Last data filed at 11/06/2021 1300 Gross per 24 hour  Intake 490.54 ml  Output --  Net 490.54 ml   Filed Weights   11/04/21 2200  Weight: 94.3 kg    Examination:  General exam: alert, awake, communicative,calm, NAD Respiratory system: Clear to auscultation. Respiratory effort normal. Cardiovascular system:  RRR.  Gastrointestinal system: Abdomen is nondistended, soft and nontender.  Normal bowel sounds heard. Central nervous system: Alert and oriented. No focal neurological deficits. Extremities:  right knee wrapped, right lower extremity erythema/edema is improving Skin: No rashes, lesions or ulcers Psychiatry: Judgement and insight appear normal. Mood & affect appropriate.     Data Reviewed: I have personally reviewed  labs and visualized  imaging studies since the last encounter and formulate the plan        Scheduled Meds:  [START ON 11/07/2021] cefadroxil  1,000 mg Oral BID   [START ON 11/07/2021] doxycycline  100 mg Oral Q12H   fluticasone  furoate-vilanterol  1 puff Inhalation Daily   gabapentin  900 mg Oral BID   lipase/protease/amylase  72,000 Units Oral TID WC   melatonin  10 mg Oral QHS   montelukast  10 mg Oral QHS   multivitamin with minerals  1 tablet Oral Daily   pantoprazole  40 mg Oral QHS   QUEtiapine  100 mg Oral QHS   sertraline  100 mg Oral Daily   topiramate  100 mg Oral BID   Continuous Infusions:  vancomycin 750 mg (11/06/21 1024)     LOS: 2 days     Florencia Reasons, MD PhD FACP [facility]  Available via Epic secure chat 7am-7pm for nonurgent issues Please page for urgent issues To page the attending provider between 7A-7P or the covering provider during after hours 7P-7A, please log into the web site [email or web address] and access using universal  password for that web site. If you do not have the password, please call the hospital operator.    11/06/2021, 3:43 PM

## 2021-11-06 NOTE — Plan of Care (Signed)
  Problem: Safety: Goal: Ability to remain free from injury will improve Outcome: Progressing   Problem: Skin Integrity: Goal: Risk for impaired skin integrity will decrease Outcome: Progressing   

## 2021-11-07 DIAGNOSIS — L03115 Cellulitis of right lower limb: Secondary | ICD-10-CM | POA: Diagnosis not present

## 2021-11-07 LAB — BODY FLUID CULTURE W GRAM STAIN
Culture: NO GROWTH
Special Requests: NORMAL

## 2021-11-07 LAB — CBC WITH DIFFERENTIAL/PLATELET
Abs Immature Granulocytes: 0.02 10*3/uL (ref 0.00–0.07)
Basophils Absolute: 0.1 10*3/uL (ref 0.0–0.1)
Basophils Relative: 1 %
Eosinophils Absolute: 0.2 10*3/uL (ref 0.0–0.5)
Eosinophils Relative: 3 %
HCT: 36.6 % (ref 36.0–46.0)
Hemoglobin: 11.6 g/dL — ABNORMAL LOW (ref 12.0–15.0)
Immature Granulocytes: 0 %
Lymphocytes Relative: 28 %
Lymphs Abs: 1.6 10*3/uL (ref 0.7–4.0)
MCH: 32.2 pg (ref 26.0–34.0)
MCHC: 31.7 g/dL (ref 30.0–36.0)
MCV: 101.7 fL — ABNORMAL HIGH (ref 80.0–100.0)
Monocytes Absolute: 0.4 10*3/uL (ref 0.1–1.0)
Monocytes Relative: 7 %
Neutro Abs: 3.3 10*3/uL (ref 1.7–7.7)
Neutrophils Relative %: 61 %
Platelets: 118 10*3/uL — ABNORMAL LOW (ref 150–400)
RBC: 3.6 MIL/uL — ABNORMAL LOW (ref 3.87–5.11)
RDW: 14.6 % (ref 11.5–15.5)
WBC: 5.5 10*3/uL (ref 4.0–10.5)
nRBC: 0 % (ref 0.0–0.2)

## 2021-11-07 LAB — COMPREHENSIVE METABOLIC PANEL
ALT: 35 U/L (ref 0–44)
AST: 112 U/L — ABNORMAL HIGH (ref 15–41)
Albumin: 2.9 g/dL — ABNORMAL LOW (ref 3.5–5.0)
Alkaline Phosphatase: 347 U/L — ABNORMAL HIGH (ref 38–126)
Anion gap: 8 (ref 5–15)
BUN: <5 mg/dL — ABNORMAL LOW (ref 6–20)
CO2: 25 mmol/L (ref 22–32)
Calcium: 8.8 mg/dL — ABNORMAL LOW (ref 8.9–10.3)
Chloride: 109 mmol/L (ref 98–111)
Creatinine, Ser: 0.65 mg/dL (ref 0.44–1.00)
GFR, Estimated: >60 mL/min (ref >60)
Glucose, Bld: 104 mg/dL — ABNORMAL HIGH (ref 70–99)
Potassium: 4.1 mmol/L (ref 3.5–5.1)
Sodium: 142 mmol/L (ref 135–145)
Total Bilirubin: 2 mg/dL — ABNORMAL HIGH (ref 0.3–1.2)
Total Protein: 6.9 g/dL (ref 6.5–8.1)

## 2021-11-07 LAB — MAGNESIUM: Magnesium: 1.9 mg/dL (ref 1.7–2.4)

## 2021-11-07 MED ORDER — HYDROMORPHONE HCL 2 MG PO TABS: 2.0000 mg | ORAL_TABLET | Freq: Four times a day (QID) | ORAL | 0 refills | Status: AC | PRN

## 2021-11-07 MED ORDER — DOXYCYCLINE HYCLATE 100 MG PO TABS: 100.0000 mg | ORAL_TABLET | Freq: Two times a day (BID) | ORAL | 0 refills | Status: AC

## 2021-11-07 MED ORDER — CEFADROXIL 500 MG PO CAPS: 1000.0000 mg | ORAL_CAPSULE | Freq: Two times a day (BID) | ORAL | 0 refills | Status: AC

## 2021-11-07 NOTE — Plan of Care (Signed)
  Problem: Education: Goal: Knowledge of General Education information will improve Description: Including pain rating scale, medication(s)/side effects and non-pharmacologic comfort measures Outcome: Progressing   Problem: Health Behavior/Discharge Planning: Goal: Ability to manage health-related needs will improve Outcome: Progressing   Problem: Clinical Measurements: Goal: Ability to maintain clinical measurements within normal limits will improve Outcome: Progressing   Problem: Respiratory: Goal: Ability to maintain adequate ventilation will improve Outcome: Progressing

## 2021-11-07 NOTE — Discharge Summary (Signed)
Discharge Summary  Kara [name] QQV:[id number] DOB: May 15, 1985  PCP: Lawerance Cruel, MD  Admit date: 11/04/2021 Discharge date: 11/07/2021  Time spent:  81mns  Recommendations for Outpatient Follow-up:  F/u with PCP within a week  for hospital discharge follow up, repeat cbc/bmp at follow up F/u with orthopedics next week  Walker provided  Work note provided  Oral dilaudid for two days prescribed per patient's request   Discharge Diagnoses:  Active Hospital Problems   Diagnosis Date Noted   Cellulitis of right lower extremity 11/04/2021   Septic prepatellar bursitis of right knee 11/04/2021   Asthma, severe persistent, well-controlled 11/11/2017   Tobacco abuse 07/31/2016   Sepsis ([facility] 06/12/2016   Benign essential HTN 09/09/2015   Chronic pancreatitis ([facility] 09/09/2015   Polyneuropathy 09/02/2014   Chronic diastolic heart failure ([facility] 07/06/2014   Hypokalemia 04/08/2014   Elevated LFTs 04/08/2014    Resolved Hospital Problems  No resolved problems to display.    Discharge Condition: stable  Diet recommendation: heart healthy  Filed Weights   11/04/21 2200  Weight: 94.3 kg    History of present illness:  H/o seizures,  chronic pancreatitis, chronic pain, asthma, tobacco abuse, chronic diastolic HF. Presenting with  right knee hematoma after a  Fall at work , possible septic bursitis, infected hematoma, s/p I&D at bedside   Hospital Course:  Principal Problem:   Cellulitis of right lower extremity Active Problems:   Hypokalemia   Elevated LFTs   Chronic diastolic heart failure (HCC)   Polyneuropathy   Chronic pancreatitis (HCC)   Benign essential HTN   Sepsis (HCC)   Tobacco abuse   Asthma, severe persistent, well-controlled   Septic prepatellar bursitis of right knee   Assessment and Plan:  Cellulitis right lower extremity/ right knee septic bursitis/infected hematoma? -[facility] 5065[facility] for 7 days, she took for  three days prior to coming to the hospital -right knee x ray: 1. No acute osseous abnormality. 2. Small joint effusion and soft tissue swelling -venous doppler no DVT --she does not appear septic, but bp low normal, with significant lactic acidosis, elevated ESR CRP, abdominal lft on presentation- all could due to infection, home bp meds beta-blocker held in the hospital -MRSA screening negative, blood culture no growth, wound culture no growth -s/p I/D by ortho on 7/19, she received iv vanc/iv rocephin , with significant improvement in right leg cellulitis, case discussed with ID pharmacy recommend change abx to doxycycline and duricef, she is discharged on oral abx and follow up with pcp and ortho next week -Oral dilaudid for two days prescribed per patient's request    Hypokalemia/hypomagnesemia Replaced, improved  F/u with pcp   LFT elevation, Possibly from cellulitis  Hepatitis panel negative Abdominal ultrasound showed hepatosteatosis, no other acute findings CK unremarkable Report prior history of alcohol use, has been in remission for years since 2017 statin held in the hospital, lft improved, resumed at discharge  Pcp to repeat lft at hospital discharge follow up   Chronic Diastolic HF -euvolemic      - diuretic held in the hospital in the setting of acute infection -home meds  spironolactone resumed at discharge -f/u with pcp   HTN,  bp low normal in the hospital , likely due to acute infection  beta-blocker held in the hospital, resumed at discharge Patient is advised to check bp at home and bring in record for pcp to review     HLD     -  statin held in the hospital d/t elevated LFTs, resume at discharge, f/u with pcp    Chronic pancreatitis      - continue home regimen   Seizure , last was around [place]      - continue home regimen   Discharge Exam: BP 107/82 (BP Location: Left Arm)   Pulse 72   Temp 98 F (36.7 C) (Oral)   Resp 18   Ht _0  (1.651 m)    Wt 94.3 kg   SpO2 99%   BMI 34.61 kg/m   General: NAD Cardiovascular: RRR Respiratory: CTABL    Discharge Instructions     Diet - low sodium heart healthy   Complete by: As directed    Discharge wound care:   Complete by: As directed    Wet to dry dressing twice a day per orthopedic's instruction   Discharge wound care:   Complete by: As directed    Wet to dry dressing twice a day Per orthopedic instruction   Increase activity slowly   Complete by: As directed    Increase activity slowly   Complete by: As directed       Allergies as of 11/07/2021       Reactions   Acetaminophen Diarrhea, Nausea And Vomiting   Nsaids Diarrhea, Nausea And Vomiting   Other Hives, Itching   Reaction to tree nuts   Peanuts [peanut Oil] Hives, Itching   Triamterene-hctz Nausea Only, Other (See Comments)   dizzy        Medication List     STOP taking these medications    cephALEXin 500 MG capsule Commonly known as: KEFLEX   Dupixent 300 MG/2ML prefilled syringe Generic drug: dupilumab       TAKE these medications    albuterol 108 (90 Base) MCG/ACT inhaler Commonly known as: VENTOLIN HFA 2 puffs every 4 hours as needed for coughing or wheezing. What changed:  how much to take how to take this when to take this reasons to take this additional instructions   Breo Ellipta 200-25 MCG/ACT Aepb Generic drug: fluticasone furoate-vilanterol INHALE 1 PUFF INTO THE LUNGS DAILY What changed:  when to take this reasons to take this   cefadroxil 500 MG capsule Commonly known as: DURICEF Take 2 capsules (1,000 mg total) by mouth 2 (two) times daily for 7 days.   doxycycline 100 MG tablet Commonly known as: VIBRA-TABS Take 1 tablet (100 mg total) by mouth every 12 (twelve) hours for 7 days.   EPINEPHrine 0.3 mg/0.3 mL Soaj injection Commonly known as: EpiPen 2-Pak Inject 0.3 mLs (0.3 mg total) into the muscle once as needed (for severe allergic reaction).   gabapentin  300 MG capsule Commonly known as: NEURONTIN TAKE 3 CAPSULES BY MOUTH TWICE DAILY   HYDROmorphone 2 MG tablet Commonly known as: Dilaudid Take 1 tablet (2 mg total) by mouth every 6 (six) hours as needed for up to 2 days for severe pain.   lipase/protease/amylase 36000 UNITS Cpep capsule Commonly known as: CREON Take 72,000 Units by mouth 3 (three) times daily with meals.   magnesium oxide 400 (240 Mg) MG tablet Commonly known as: MagOx 400 Take 1 tablet (400 mg total) by mouth daily.   Melatonin 10 MG Tabs Take 10 mg by mouth at bedtime.   metoprolol succinate 50 MG 24 hr tablet Commonly known as: TOPROL-XL Take 1 tablet (50 mg total) by mouth daily. Pt needs to keep upcoming appt in Nov for 90 day refills What changed:  when to take this additional instructions   montelukast 10 MG tablet Commonly known as: SINGULAIR TAKE ONE TABLET BY MOUTH AT BEDTIME What changed:  how much to take how to take this when to take this additional instructions   multivitamin with minerals tablet Take 1 tablet by mouth daily.   naloxone 4 MG/0.1ML Liqd nasal spray kit Commonly known as: NARCAN Place 0.4 mg into the nose once as needed (severe allergic reaction).   omeprazole 40 MG capsule Commonly known as: PRILOSEC TAKE 1 CAPSULE(40 MG) BY MOUTH DAILY What changed: See the new instructions.   ondansetron 8 MG disintegrating tablet Commonly known as: ZOFRAN-ODT Take 8 mg by mouth 3 (three) times daily as needed for nausea or vomiting.   oxyCODONE 15 MG immediate release tablet Commonly known as: ROXICODONE Take 15 mg by mouth 3 (three) times daily as needed (chronic pancreatic pain).   polyethylene glycol 17 g packet Commonly known as: MIRALAX / GLYCOLAX Take 17 g by mouth daily as needed for mild constipation or moderate constipation.   QUEtiapine 100 MG tablet Commonly known as: SEROQUEL Take 100 mg by mouth at bedtime.   rosuvastatin 10 MG tablet Commonly known as:  CRESTOR Take 10 mg by mouth every morning.   sertraline 100 MG tablet Commonly known as: ZOLOFT Take 1 tablet (100 mg total) by mouth daily.   spironolactone 25 MG tablet Commonly known as: ALDACTONE Take 25 mg by mouth daily as needed (leg swelling).   topiramate 100 MG tablet Commonly known as: Topamax Take 1 tablet (100 mg total) by mouth 2 (two) times daily.   VITAMIN D3 PO Take 1 tablet by mouth every morning.               Durable Medical Equipment  (From admission, onward)           Start     Ordered   11/06/21 1117  For home use only DME Walker rolling  Once       Question Answer Comment  Walker: With [place]   Patient needs a walker to treat with the following condition FTT (failure to thrive) in adult      11/06/21 1116   11/06/21 1025  For home use only DME Walker rolling  Once       Question Answer Comment  Walker: With [place]   Patient needs a walker to treat with the following condition Cellulitis of right lower extremity      11/06/21 1024              Discharge Care Instructions  (From admission, onward)           Start     Ordered   11/07/21 0000  Discharge wound care:       Comments: Wet to dry dressing twice a day per orthopedic's instruction   11/07/21 0757   11/07/21 0000  Discharge wound care:       Comments: Wet to dry dressing twice a day Per orthopedic instruction   11/07/21 0915           Allergies  Allergen Reactions   Acetaminophen Diarrhea and Nausea And Vomiting   Nsaids Diarrhea and Nausea And Vomiting   Other Hives and Itching    Reaction to tree nuts   Peanuts [Peanut Oil] Hives and Itching   Triamterene-Hctz Nausea Only and Other (See Comments)    dizzy    Follow-up Information     Erlinda Hong, Naiping  M, MD Follow up in 1 week(s).   Specialty: Orthopedic Surgery Why: right knee wound check Contact information: [place] [address] [phone]         Lawerance Cruel, MD Follow up in 2 day(s).   Specialty: Family Medicine Why: hospital discharge follow up, repeat cbc/cmp/mag at follow up pcp to monitor liver function  please check your blood pressure at home 1-2 times a day, bring in record for your pcp to review Contact information: [place] [address] [phone]         Josue Hector, MD .   Specialty: Cardiology Contact information: [address] [place] [address] [phone]                  The results of significant diagnostics from this hospitalization (including imaging, microbiology, ancillary and laboratory) are listed below for reference.    Significant Diagnostic Studies: US Abdomen Limited RUQ (LIVER/GB)  Result Date: 11/04/2021 CLINICAL DATA:  Elevated LFTs. EXAM: ULTRASOUND ABDOMEN LIMITED RIGHT UPPER QUADRANT COMPARISON:  CT abdomen pelvis dated August 05, 2020. FINDINGS: Gallbladder: Surgically absent. Common bile duct: Diameter: 6 mm, normal. Liver: No focal lesion identified. Increased parenchymal echogenicity. Portal vein is patent on color Doppler imaging with normal direction of blood flow towards the liver. Other: None. IMPRESSION: 1. No acute abnormality. 2. Unchanged hepatic steatosis. Electronically Signed   By: Titus Dubin M.D.   On: 11/04/2021 14:50   VAS [place] LOWER EXTREMITY VENOUS (DVT) (7a-7p)  Result Date: 11/04/2021  Lower Venous DVT Study Patient Name:  ELZORA CULLINS  Date of Exam:   11/04/2021 Medical Rec #: [id number]      Accession #:    [id number] Date of Birth: [birth year]       Patient Gender: F Patient Age:   65 years Exam Location:  [facility] Procedure:      VAS [place] LOWER EXTREMITY VENOUS (DVT) Referring Phys: GRACE LOEFFLER --------------------------------------------------------------------------------  Indications: Edema, Erythema, and s/p mechanical fall x1 week.  Limitations: Poor ultrasound/tissue interface. Comparison Study:  No prior study Performing Technologist: Maudry Mayhew MHA, RDMS, RVT, RDCS  Examination Guidelines: A complete evaluation includes B-mode imaging, spectral Doppler, color Doppler, and power Doppler as needed of all accessible portions of each vessel. Bilateral testing is considered an integral part of a complete examination. Limited examinations for reoccurring indications may be performed as noted. The reflux portion of the exam is performed with the patient in reverse Trendelenburg.  +---------+---------------+---------+-----------+----------+--------------+ RIGHT    CompressibilityPhasicitySpontaneityPropertiesThrombus Aging +---------+---------------+---------+-----------+----------+--------------+ CFV      Full           Yes      Yes                                 +---------+---------------+---------+-----------+----------+--------------+ SFJ      Full                                                        +---------+---------------+---------+-----------+----------+--------------+ FV Prox  Full                                                        +---------+---------------+---------+-----------+----------+--------------+  FV Mid   Full                                                        +---------+---------------+---------+-----------+----------+--------------+ FV DistalFull                                                        +---------+---------------+---------+-----------+----------+--------------+ PFV      Full                                                        +---------+---------------+---------+-----------+----------+--------------+ POP      Full           Yes      Yes                                 +---------+---------------+---------+-----------+----------+--------------+ PTV      Full                                                        +---------+---------------+---------+-----------+----------+--------------+   Right  Technical Findings: Not visualized segments include peroneal veins.  +----+---------------+---------+-----------+----------+--------------+ LEFTCompressibilityPhasicitySpontaneityPropertiesThrombus Aging +----+---------------+---------+-----------+----------+--------------+ CFV Full           Yes      Yes                                 +----+---------------+---------+-----------+----------+--------------+    Summary: RIGHT: - There is no evidence of deep vein thrombosis in the lower extremity. However, portions of this examination were limited- see technologist comments above.  - No cystic structure found in the popliteal fossa. - Ultrasound characteristics of enlarged lymph nodes are noted in the groin.  LEFT: - No evidence of common femoral vein obstruction.  *See table(s) above for measurements and observations. Electronically signed by Monica Martinez MD on 11/04/2021 at 1:01:27 PM.    Final    DG Knee Complete 4 Views Right  Result Date: 11/04/2021 CLINICAL DATA:  Right knee swelling EXAM: RIGHT KNEE - COMPLETE 5 VIEW COMPARISON:  None Available. FINDINGS: No fracture or dislocation. Small joint effusion. No evidence of arthropathy or other focal bone abnormality. Soft tissue swelling about the knee. IMPRESSION: 1. No acute osseous abnormality. 2. Small joint effusion and soft tissue swelling. Electronically Signed   By: Yetta Glassman M.D.   On: 11/04/2021 08:28    Microbiology: Recent Results (from the past 240 hour(s))  Blood culture (routine x 2)     Status: None (Preliminary result)   Collection Time: 11/04/21  9:41 AM   Specimen: BLOOD  Result Value Ref Range Status   Specimen Description   Final    BLOOD RIGHT ANTECUBITAL Performed at [facility]  [address] [place], [place] [address]    Special Requests   Final    BOTTLES DRAWN AEROBIC AND ANAEROBIC Blood Culture adequate volume Performed at [facility] [address] [place], [place] [address]    Culture   Final    NO GROWTH 3 DAYS Performed at [facility], [place] [address], [place], [place] [address]    Report Status PENDING  Incomplete  Blood culture (routine x 2)     Status: None (Preliminary result)   Collection Time: 11/04/21  9:45 AM   Specimen: BLOOD LEFT HAND  Result Value Ref Range Status   Specimen Description   Final    BLOOD LEFT HAND Performed at [facility] [address] [place], [place] [address]    Special Requests   Final    BOTTLES DRAWN AEROBIC AND ANAEROBIC Blood Culture results may not be optimal due to an excessive volume of blood received in culture bottles Performed at [facility] [address] [place], [place] [address]    Culture   Final    NO GROWTH 3 DAYS Performed at [facility], [place] [address] [place], [place] [address]    Report Status PENDING  Incomplete  Body fluid culture w Gram Stain     Status: None   Collection Time: 11/04/21  9:57 AM   Specimen: Abscess; Body Fluid  Result Value Ref Range Status   Specimen Description   Final    ABSCESS KNEE Performed at [facility] [address] [place], [place] [address]    Special Requests   Final    Normal Performed at [facility], [place] [address], [place], [place] [address]    Gram Stain   Final    ABUNDANT WBC PRESENT,BOTH PMN AND MONONUCLEAR NO ORGANISMS SEEN    Culture   Final    NO GROWTH 3 DAYS Performed at [facility], [place] [address] [place], [place] [address]    Report Status 11/07/2021 FINAL  Final  MRSA Next Gen by PCR, Nasal     Status: None   Collection Time: 11/05/21 10:19 AM   Specimen: Nasal Mucosa; Nasal Swab  Result Value Ref Range Status   MRSA by PCR Next Gen NOT DETECTED NOT DETECTED Final    Comment: (NOTE) The GeneXpert MRSA Assay (FDA approved for NASAL specimens only), is one component of a comprehensive MRSA colonization  surveillance program. It is not intended to diagnose MRSA infection nor to guide or monitor treatment for MRSA infections. Test performance is not FDA approved in patients less than 67 years old. Performed at [facility], [place] [address], [place], [place] [address]      Labs: Basic Metabolic Panel: Recent Labs  Lab 11/04/21 0805 11/05/21 0331 11/06/21 0315 11/07/21 0325  NA 137 139 139 142  K 2.8* 3.5 4.1 4.1  CL 101 107 106 109  CO2 _0 GLUCOSE 129* 117* 149* 104*  BUN <5* <5* <5* <5*  CREATININE 0.64 0.55 0.58 0.65  CALCIUM 8.1* 8.2* 8.6* 8.8*  MG 1.6*  --  1.8 1.9   Liver Function Tests: Recent Labs  Lab 11/04/21 0805 11/05/21 0331 11/06/21 0315 11/07/21 0325  AST 211* 198* 131* 112*  ALT 50* 44 38 35  ALKPHOS 401* 355* 348* 347*  BILITOT 1.3* 2.4* 2.0* 2.0*  PROT 6.7 6.0* 6.4* 6.9  ALBUMIN 2.9* 2.5* 2.7* 2.9*  No results for input(s): "LIPASE", "AMYLASE" in the last 168 hours. No results for input(s): "AMMONIA" in the last 168 hours. CBC: Recent Labs  Lab 11/04/21 0805 11/05/21 0331 11/06/21 0315 11/07/21 0325  WBC 5.9 4.6 5.4 5.5  NEUTROABS 3.7  --  3.2 3.3  HGB 12.1 11.5* 11.2* 11.6*  HCT 36.3 35.7* 35.1* 36.6  MCV 97.1 99.2 99.7 101.7*  PLT 142* 91* 100* 118*   Cardiac Enzymes: Recent Labs  Lab 11/06/21 0315  CKTOTAL 69   BNP: BNP (last 3 results) Recent Labs    12/07/20 1043  BNP 132.9*    ProBNP (last 3 results) No results for input(s): "PROBNP" in the last 8760 hours.  CBG: No results for input(s): "GLUCAP" in the last 168 hours.  FURTHER DISCHARGE INSTRUCTIONS:   Get Medicines reviewed and adjusted: Please take all your medications with you for your next visit with your Primary MD   Laboratory/radiological data: Please request your Primary MD to go over all hospital tests and procedure/radiological results at the follow up, please ask your Primary MD to get all Hospital records sent to his/her  office.   In some cases, they will be blood work, cultures and biopsy results pending at the time of your discharge. Please request that your primary care M.D. goes through all the records of your hospital data and follows up on these results.   Also Note the following: If you experience worsening of your admission symptoms, develop shortness of breath, life threatening emergency, suicidal or homicidal thoughts you must seek medical attention immediately by calling 911 or calling your MD immediately  if symptoms less severe.   You must read complete instructions/literature along with all the possible adverse reactions/side effects for all the Medicines you take and that have been prescribed to you. Take any new Medicines after you have completely understood and accpet all the possible adverse reactions/side effects.    Do not drive when taking Pain medications or sleeping medications (Benzodaizepines)   Do not take more than prescribed Pain, Sleep and Anxiety Medications. It is not advisable to combine anxiety,sleep and pain medications without talking with your primary care practitioner   Special Instructions: If you have smoked or chewed Tobacco  in the last 2 yrs please stop smoking, stop any regular Alcohol  and or any Recreational drug use.   Wear Seat belts while driving.   Please note: You were cared for by a hospitalist during your hospital stay. Once you are discharged, your primary care physician will handle any further medical issues. Please note that NO REFILLS for any discharge medications will be authorized once you are discharged, as it is imperative that you return to your primary care physician (or establish a relationship with a primary care physician if you do not have one) for your post hospital discharge needs so that they can reassess your need for medications and monitor your lab values.     Signed:  Florencia Reasons MD, PhD, FACP  [facility] 11/07/2021, 9:42 AM

## 2021-11-07 NOTE — Progress Notes (Signed)
Pt stable at time of d/c instructions and education. No needs at this time. Rn will continue to monitor until pt ride arrives.

## 2021-11-07 NOTE — Plan of Care (Signed)
Pt stable at this time with no needs. Dressing change performed prior to pt d/c.

## 2021-11-09 LAB — CULTURE, BLOOD (ROUTINE X 2)
Culture: NO GROWTH
Culture: NO GROWTH
Special Requests: ADEQUATE

## 2021-11-13 ENCOUNTER — Inpatient Hospital Stay (HOSPITAL_COMMUNITY)
Admission: EM | Admit: 2021-11-13 | Discharge: 2021-11-26 | DRG: 004 | Disposition: A | Discharge status: Other Acute Inpatient Hospital | Payer: BC Managed Care – PPO | Attending: Internal Medicine | Admitting: Internal Medicine

## 2021-11-13 ENCOUNTER — Inpatient Hospital Stay (HOSPITAL_COMMUNITY): Payer: BC Managed Care – PPO

## 2021-11-13 ENCOUNTER — Other Ambulatory Visit: Payer: Self-pay

## 2021-11-13 ENCOUNTER — Emergency Department (HOSPITAL_COMMUNITY): Payer: BC Managed Care – PPO

## 2021-11-13 ENCOUNTER — Encounter (HOSPITAL_COMMUNITY): Payer: Self-pay

## 2021-11-13 DIAGNOSIS — I959 Hypotension, unspecified: Secondary | ICD-10-CM | POA: Diagnosis present

## 2021-11-13 DIAGNOSIS — I462 Cardiac arrest due to underlying cardiac condition: Secondary | ICD-10-CM | POA: Diagnosis present

## 2021-11-13 DIAGNOSIS — G931 Anoxic brain damage, not elsewhere classified: Secondary | ICD-10-CM | POA: Diagnosis present

## 2021-11-13 DIAGNOSIS — R4689 Other symptoms and signs involving appearance and behavior: Secondary | ICD-10-CM | POA: Diagnosis not present

## 2021-11-13 DIAGNOSIS — Z7951 Long term (current) use of inhaled steroids: Secondary | ICD-10-CM

## 2021-11-13 DIAGNOSIS — Z8249 Family history of ischemic heart disease and other diseases of the circulatory system: Secondary | ICD-10-CM

## 2021-11-13 DIAGNOSIS — R569 Unspecified convulsions: Secondary | ICD-10-CM

## 2021-11-13 DIAGNOSIS — R570 Cardiogenic shock: Secondary | ICD-10-CM | POA: Diagnosis not present

## 2021-11-13 DIAGNOSIS — E781 Pure hyperglyceridemia: Secondary | ICD-10-CM | POA: Diagnosis present

## 2021-11-13 DIAGNOSIS — I5032 Chronic diastolic (congestive) heart failure: Secondary | ICD-10-CM | POA: Diagnosis present

## 2021-11-13 DIAGNOSIS — E785 Hyperlipidemia, unspecified: Secondary | ICD-10-CM | POA: Diagnosis present

## 2021-11-13 DIAGNOSIS — G629 Polyneuropathy, unspecified: Secondary | ICD-10-CM | POA: Diagnosis present

## 2021-11-13 DIAGNOSIS — J45909 Unspecified asthma, uncomplicated: Secondary | ICD-10-CM | POA: Diagnosis present

## 2021-11-13 DIAGNOSIS — D696 Thrombocytopenia, unspecified: Secondary | ICD-10-CM | POA: Diagnosis present

## 2021-11-13 DIAGNOSIS — G9340 Encephalopathy, unspecified: Secondary | ICD-10-CM | POA: Diagnosis not present

## 2021-11-13 DIAGNOSIS — I469 Cardiac arrest, cause unspecified: Secondary | ICD-10-CM | POA: Diagnosis not present

## 2021-11-13 DIAGNOSIS — R9431 Abnormal electrocardiogram [ECG] [EKG]: Secondary | ICD-10-CM

## 2021-11-13 DIAGNOSIS — E8770 Fluid overload, unspecified: Secondary | ICD-10-CM | POA: Diagnosis not present

## 2021-11-13 DIAGNOSIS — N179 Acute kidney failure, unspecified: Secondary | ICD-10-CM | POA: Diagnosis not present

## 2021-11-13 DIAGNOSIS — I21A1 Myocardial infarction type 2: Secondary | ICD-10-CM | POA: Diagnosis present

## 2021-11-13 DIAGNOSIS — R4182 Altered mental status, unspecified: Secondary | ICD-10-CM

## 2021-11-13 DIAGNOSIS — K7200 Acute and subacute hepatic failure without coma: Secondary | ICD-10-CM | POA: Diagnosis present

## 2021-11-13 DIAGNOSIS — J690 Pneumonitis due to inhalation of food and vomit: Secondary | ICD-10-CM | POA: Diagnosis not present

## 2021-11-13 DIAGNOSIS — I429 Cardiomyopathy, unspecified: Secondary | ICD-10-CM | POA: Diagnosis present

## 2021-11-13 DIAGNOSIS — L03115 Cellulitis of right lower limb: Secondary | ICD-10-CM | POA: Diagnosis present

## 2021-11-13 DIAGNOSIS — N170 Acute kidney failure with tubular necrosis: Secondary | ICD-10-CM | POA: Diagnosis present

## 2021-11-13 DIAGNOSIS — E871 Hypo-osmolality and hyponatremia: Secondary | ICD-10-CM | POA: Diagnosis present

## 2021-11-13 DIAGNOSIS — Z79899 Other long term (current) drug therapy: Secondary | ICD-10-CM

## 2021-11-13 DIAGNOSIS — Z9049 Acquired absence of other specified parts of digestive tract: Secondary | ICD-10-CM

## 2021-11-13 DIAGNOSIS — R5381 Other malaise: Secondary | ICD-10-CM | POA: Diagnosis present

## 2021-11-13 DIAGNOSIS — I4581 Long QT syndrome: Secondary | ICD-10-CM | POA: Diagnosis present

## 2021-11-13 DIAGNOSIS — E876 Hypokalemia: Secondary | ICD-10-CM | POA: Diagnosis present

## 2021-11-13 DIAGNOSIS — F1010 Alcohol abuse, uncomplicated: Secondary | ICD-10-CM | POA: Diagnosis present

## 2021-11-13 DIAGNOSIS — I472 Ventricular tachycardia, unspecified: Principal | ICD-10-CM | POA: Diagnosis present

## 2021-11-13 DIAGNOSIS — G894 Chronic pain syndrome: Secondary | ICD-10-CM | POA: Diagnosis present

## 2021-11-13 DIAGNOSIS — E872 Acidosis, unspecified: Secondary | ICD-10-CM | POA: Diagnosis present

## 2021-11-13 DIAGNOSIS — Z86718 Personal history of other venous thrombosis and embolism: Secondary | ICD-10-CM

## 2021-11-13 DIAGNOSIS — R251 Tremor, unspecified: Secondary | ICD-10-CM | POA: Diagnosis not present

## 2021-11-13 DIAGNOSIS — K838 Other specified diseases of biliary tract: Secondary | ICD-10-CM | POA: Diagnosis present

## 2021-11-13 DIAGNOSIS — K8050 Calculus of bile duct without cholangitis or cholecystitis without obstruction: Secondary | ICD-10-CM | POA: Diagnosis present

## 2021-11-13 DIAGNOSIS — R609 Edema, unspecified: Secondary | ICD-10-CM | POA: Diagnosis not present

## 2021-11-13 DIAGNOSIS — G40909 Epilepsy, unspecified, not intractable, without status epilepticus: Secondary | ICD-10-CM | POA: Diagnosis present

## 2021-11-13 DIAGNOSIS — J9600 Acute respiratory failure, unspecified whether with hypoxia or hypercapnia: Secondary | ICD-10-CM | POA: Diagnosis not present

## 2021-11-13 DIAGNOSIS — K219 Gastro-esophageal reflux disease without esophagitis: Secondary | ICD-10-CM | POA: Diagnosis present

## 2021-11-13 DIAGNOSIS — I110 Hypertensive heart disease with heart failure: Secondary | ICD-10-CM | POA: Diagnosis present

## 2021-11-13 DIAGNOSIS — K860 Alcohol-induced chronic pancreatitis: Secondary | ICD-10-CM | POA: Diagnosis present

## 2021-11-13 DIAGNOSIS — R739 Hyperglycemia, unspecified: Secondary | ICD-10-CM | POA: Diagnosis present

## 2021-11-13 DIAGNOSIS — E669 Obesity, unspecified: Secondary | ICD-10-CM | POA: Diagnosis present

## 2021-11-13 DIAGNOSIS — R197 Diarrhea, unspecified: Secondary | ICD-10-CM | POA: Diagnosis not present

## 2021-11-13 DIAGNOSIS — J9601 Acute respiratory failure with hypoxia: Secondary | ICD-10-CM | POA: Diagnosis present

## 2021-11-13 DIAGNOSIS — F419 Anxiety disorder, unspecified: Secondary | ICD-10-CM | POA: Diagnosis present

## 2021-11-13 DIAGNOSIS — I4901 Ventricular fibrillation: Secondary | ICD-10-CM | POA: Diagnosis present

## 2021-11-13 DIAGNOSIS — J9621 Acute and chronic respiratory failure with hypoxia: Secondary | ICD-10-CM | POA: Diagnosis not present

## 2021-11-13 DIAGNOSIS — Z930 Tracheostomy status: Secondary | ICD-10-CM | POA: Diagnosis not present

## 2021-11-13 DIAGNOSIS — F1011 Alcohol abuse, in remission: Secondary | ICD-10-CM | POA: Diagnosis not present

## 2021-11-13 HISTORY — DX: Cardiac arrest, cause unspecified: I46.9

## 2021-11-13 LAB — CBC WITH DIFFERENTIAL/PLATELET
Abs Immature Granulocytes: 0.81 10*3/uL — ABNORMAL HIGH (ref 0.00–0.07)
Basophils Absolute: 0.3 10*3/uL — ABNORMAL HIGH (ref 0.0–0.1)
Basophils Relative: 1 %
Eosinophils Absolute: 0.1 10*3/uL (ref 0.0–0.5)
Eosinophils Relative: 0 %
HCT: 41 % (ref 36.0–46.0)
Hemoglobin: 13 g/dL (ref 12.0–15.0)
Immature Granulocytes: 3 %
Lymphocytes Relative: 29 %
Lymphs Abs: 6.9 10*3/uL — ABNORMAL HIGH (ref 0.7–4.0)
MCH: 31.9 pg (ref 26.0–34.0)
MCHC: 31.7 g/dL (ref 30.0–36.0)
MCV: 100.7 fL — ABNORMAL HIGH (ref 80.0–100.0)
Monocytes Absolute: 0.9 10*3/uL (ref 0.1–1.0)
Monocytes Relative: 4 %
Neutro Abs: 14.9 10*3/uL — ABNORMAL HIGH (ref 1.7–7.7)
Neutrophils Relative %: 63 %
Platelets: 330 10*3/uL (ref 150–400)
RBC: 4.07 MIL/uL (ref 3.87–5.11)
RDW: 15.3 % (ref 11.5–15.5)
WBC: 23.9 10*3/uL — ABNORMAL HIGH (ref 4.0–10.5)
nRBC: 0.2 % (ref 0.0–0.2)

## 2021-11-13 LAB — URINALYSIS, ROUTINE W REFLEX MICROSCOPIC
Bilirubin Urine: NEGATIVE
Glucose, UA: NEGATIVE mg/dL
Hgb urine dipstick: NEGATIVE
Ketones, ur: 5 mg/dL — AB
Leukocytes,Ua: NEGATIVE
Nitrite: NEGATIVE
Protein, ur: 100 mg/dL — AB
Specific Gravity, Urine: 1.026 (ref 1.005–1.030)
pH: 7 (ref 5.0–8.0)

## 2021-11-13 LAB — POCT I-STAT 7, (LYTES, BLD GAS, ICA,H+H)
Acid-base deficit: 6 mmol/L — ABNORMAL HIGH (ref 0.0–2.0)
Bicarbonate: 20.1 mmol/L (ref 20.0–28.0)
Calcium, Ion: 1.13 mmol/L — ABNORMAL LOW (ref 1.15–1.40)
HCT: 40 % (ref 36.0–46.0)
Hemoglobin: 13.6 g/dL (ref 12.0–15.0)
O2 Saturation: 99 %
Patient temperature: 37
Potassium: 2.7 mmol/L — CL (ref 3.5–5.1)
Sodium: 143 mmol/L (ref 135–145)
TCO2: 21 mmol/L — ABNORMAL LOW (ref 22–32)
pCO2 arterial: 40.1 mmHg (ref 32–48)
pH, Arterial: 7.309 — ABNORMAL LOW (ref 7.35–7.45)
pO2, Arterial: 154 mmHg — ABNORMAL HIGH (ref 83–108)

## 2021-11-13 LAB — I-STAT ARTERIAL BLOOD GAS, ED
Acid-base deficit: 7 mmol/L — ABNORMAL HIGH (ref 0.0–2.0)
Bicarbonate: 19.8 mmol/L — ABNORMAL LOW (ref 20.0–28.0)
Calcium, Ion: 1.11 mmol/L — ABNORMAL LOW (ref 1.15–1.40)
HCT: 38 % (ref 36.0–46.0)
Hemoglobin: 12.9 g/dL (ref 12.0–15.0)
O2 Saturation: 99 %
Patient temperature: 98.6
Potassium: 2.5 mmol/L — CL (ref 3.5–5.1)
Sodium: 144 mmol/L (ref 135–145)
TCO2: 21 mmol/L — ABNORMAL LOW (ref 22–32)
pCO2 arterial: 45.5 mmHg (ref 32–48)
pH, Arterial: 7.246 — ABNORMAL LOW (ref 7.35–7.45)
pO2, Arterial: 156 mmHg — ABNORMAL HIGH (ref 83–108)

## 2021-11-13 LAB — COMPREHENSIVE METABOLIC PANEL
ALT: 43 U/L (ref 0–44)
AST: 178 U/L — ABNORMAL HIGH (ref 15–41)
Albumin: 2.8 g/dL — ABNORMAL LOW (ref 3.5–5.0)
Alkaline Phosphatase: 321 U/L — ABNORMAL HIGH (ref 38–126)
Anion gap: 18 — ABNORMAL HIGH (ref 5–15)
BUN: 6 mg/dL (ref 6–20)
CO2: 16 mmol/L — ABNORMAL LOW (ref 22–32)
Calcium: 8.2 mg/dL — ABNORMAL LOW (ref 8.9–10.3)
Chloride: 108 mmol/L (ref 98–111)
Creatinine, Ser: 1.06 mg/dL — ABNORMAL HIGH (ref 0.44–1.00)
GFR, Estimated: >60 mL/min (ref >60)
Glucose, Bld: 290 mg/dL — ABNORMAL HIGH (ref 70–99)
Potassium: 2.7 mmol/L — CL (ref 3.5–5.1)
Sodium: 142 mmol/L (ref 135–145)
Total Bilirubin: 2.6 mg/dL — ABNORMAL HIGH (ref 0.3–1.2)
Total Protein: 6.4 g/dL — ABNORMAL LOW (ref 6.5–8.1)

## 2021-11-13 LAB — RAPID URINE DRUG SCREEN, HOSP PERFORMED
Amphetamines: NOT DETECTED
Barbiturates: NOT DETECTED
Benzodiazepines: NOT DETECTED
Cocaine: NOT DETECTED
Opiates: NOT DETECTED
Tetrahydrocannabinol: NOT DETECTED

## 2021-11-13 LAB — I-STAT CHEM 8, ED
BUN: 4 mg/dL — ABNORMAL LOW (ref 6–20)
Calcium, Ion: 1.01 mmol/L — ABNORMAL LOW (ref 1.15–1.40)
Chloride: 107 mmol/L (ref 98–111)
Creatinine, Ser: 0.7 mg/dL (ref 0.44–1.00)
Glucose, Bld: 292 mg/dL — ABNORMAL HIGH (ref 70–99)
HCT: 42 % (ref 36.0–46.0)
Hemoglobin: 14.3 g/dL (ref 12.0–15.0)
Potassium: 2.7 mmol/L — CL (ref 3.5–5.1)
Sodium: 144 mmol/L (ref 135–145)
TCO2: 16 mmol/L — ABNORMAL LOW (ref 22–32)

## 2021-11-13 LAB — SALICYLATE LEVEL: Salicylate Lvl: <7 mg/dL — ABNORMAL LOW (ref 7.0–30.0)

## 2021-11-13 LAB — I-STAT VENOUS BLOOD GAS, ED
Acid-base deficit: 7 mmol/L — ABNORMAL HIGH (ref 0.0–2.0)
Bicarbonate: 17.6 mmol/L — ABNORMAL LOW (ref 20.0–28.0)
Calcium, Ion: 0.99 mmol/L — ABNORMAL LOW (ref 1.15–1.40)
HCT: 41 % (ref 36.0–46.0)
Hemoglobin: 13.9 g/dL (ref 12.0–15.0)
O2 Saturation: 99 %
Potassium: 2.7 mmol/L — CL (ref 3.5–5.1)
Sodium: 144 mmol/L (ref 135–145)
TCO2: 19 mmol/L — ABNORMAL LOW (ref 22–32)
pCO2, Ven: 32.6 mmHg — ABNORMAL LOW (ref 44–60)
pH, Ven: 7.342 (ref 7.25–7.43)
pO2, Ven: 145 mmHg — ABNORMAL HIGH (ref 32–45)

## 2021-11-13 LAB — HEMOGLOBIN A1C
Hgb A1c MFr Bld: 4.5 % — ABNORMAL LOW (ref 4.8–5.6)
Mean Plasma Glucose: 82.45 mg/dL

## 2021-11-13 LAB — GLUCOSE, CAPILLARY: Glucose-Capillary: 258 mg/dL — ABNORMAL HIGH (ref 70–99)

## 2021-11-13 LAB — APTT: aPTT: 34 seconds (ref 24–36)

## 2021-11-13 LAB — PROTIME-INR
INR: 1.5 — ABNORMAL HIGH (ref 0.8–1.2)
Prothrombin Time: 17.9 seconds — ABNORMAL HIGH (ref 11.4–15.2)

## 2021-11-13 LAB — BRAIN NATRIURETIC PEPTIDE: B Natriuretic Peptide: 252.7 pg/mL — ABNORMAL HIGH (ref 0.0–100.0)

## 2021-11-13 LAB — AMMONIA: Ammonia: 142 umol/L — ABNORMAL HIGH (ref 9–35)

## 2021-11-13 LAB — I-STAT BETA HCG BLOOD, ED (MC, WL, AP ONLY): I-stat hCG, quantitative: <5 m[IU]/mL (ref <5)

## 2021-11-13 LAB — ACETAMINOPHEN LEVEL: Acetaminophen (Tylenol), Serum: <10 ug/mL — ABNORMAL LOW (ref 10–30)

## 2021-11-13 LAB — LACTIC ACID, PLASMA: Lactic Acid, Venous: 7.9 mmol/L (ref 0.5–1.9)

## 2021-11-13 LAB — ETHANOL: Alcohol, Ethyl (B): <10 mg/dL (ref <10)

## 2021-11-13 LAB — TSH: TSH: 11.491 u[IU]/mL — ABNORMAL HIGH (ref 0.350–4.500)

## 2021-11-13 LAB — TROPONIN I (HIGH SENSITIVITY)
Troponin I (High Sensitivity): 111 ng/L (ref <18)
Troponin I (High Sensitivity): 648 ng/L (ref <18)

## 2021-11-13 LAB — MAGNESIUM: Magnesium: 1.5 mg/dL — ABNORMAL LOW (ref 1.7–2.4)

## 2021-11-13 MED ORDER — POTASSIUM CHLORIDE 20 MEQ PO PACK
60.0000 meq | PACK | Freq: Once | ORAL | Status: AC
  Administered 2021-11-13: 60 meq
  Filled 2021-11-13: qty 3

## 2021-11-13 MED ORDER — ONDANSETRON HCL 4 MG/2ML IJ SOLN
INTRAMUSCULAR | Status: AC | PRN
  Administered 2021-11-13: 4 mg via INTRAVENOUS

## 2021-11-13 MED ORDER — ARFORMOTEROL TARTRATE 15 MCG/2ML IN NEBU
15.0000 ug | INHALATION_SOLUTION | Freq: Two times a day (BID) | RESPIRATORY_TRACT | Status: DC
  Administered 2021-11-14 – 2021-11-26 (×26): 15 ug via RESPIRATORY_TRACT
  Filled 2021-11-13 (×26): qty 2

## 2021-11-13 MED ORDER — MIDAZOLAM HCL 2 MG/2ML IJ SOLN
2.0000 mg | INTRAMUSCULAR | Status: DC | PRN
Start: 2021-11-13 — End: 2021-11-21
  Administered 2021-11-14 – 2021-11-16 (×5): 2 mg via INTRAVENOUS
  Filled 2021-11-13 (×5): qty 2

## 2021-11-13 MED ORDER — FOLIC ACID 1 MG PO TABS
1.0000 mg | ORAL_TABLET | Freq: Every day | ORAL | Status: DC
  Administered 2021-11-13 – 2021-11-26 (×14): 1 mg
  Filled 2021-11-13 (×14): qty 1

## 2021-11-13 MED ORDER — PROPOFOL 1000 MG/100ML IV EMUL
0.0000 ug/kg/min | INTRAVENOUS | Status: DC
  Administered 2021-11-13: 20 ug/kg/min via INTRAVENOUS

## 2021-11-13 MED ORDER — FENTANYL CITRATE (PF) 100 MCG/2ML IJ SOLN
INTRAMUSCULAR | Status: AC | PRN
  Administered 2021-11-13 (×2): 100 ug via INTRAVENOUS

## 2021-11-13 MED ORDER — ARFORMOTEROL TARTRATE 15 MCG/2ML IN NEBU: 15.0000 ug | INHALATION_SOLUTION | Freq: Two times a day (BID) | RESPIRATORY_TRACT | Status: DC

## 2021-11-13 MED ORDER — BUDESONIDE 0.25 MG/2ML IN SUSP
0.2500 mg | Freq: Two times a day (BID) | RESPIRATORY_TRACT | Status: DC
Start: 2021-11-13 — End: 2021-11-13

## 2021-11-13 MED ORDER — POLYETHYLENE GLYCOL 3350 17 G PO PACK: 17.0000 g | PACK | Freq: Every day | ORAL | Status: DC | PRN

## 2021-11-13 MED ORDER — MIDAZOLAM HCL 2 MG/2ML IJ SOLN
INTRAMUSCULAR | Status: AC | PRN
  Administered 2021-11-13: 5 mg via INTRAVENOUS

## 2021-11-13 MED ORDER — POLYETHYLENE GLYCOL 3350 17 G PO PACK
17.0000 g | PACK | Freq: Every day | ORAL | Status: DC
Start: 2021-11-13 — End: 2021-11-24
  Administered 2021-11-13 – 2021-11-20 (×5): 17 g
  Filled 2021-11-13 (×6): qty 1

## 2021-11-13 MED ORDER — FENTANYL 2500MCG IN NS 250ML (10MCG/ML) PREMIX INFUSION
50.0000 ug/h | INTRAVENOUS | Status: DC
  Administered 2021-11-14 – 2021-11-15 (×3): 150 ug/h via INTRAVENOUS
  Administered 2021-11-16 – 2021-11-17 (×2): 200 ug/h via INTRAVENOUS
  Administered 2021-11-17: 50 ug/h via INTRAVENOUS
  Administered 2021-11-17 – 2021-11-18 (×2): 150 ug/h via INTRAVENOUS
  Administered 2021-11-18 – 2021-11-19 (×2): 200 ug/h via INTRAVENOUS
  Filled 2021-11-13 (×9): qty 250

## 2021-11-13 MED ORDER — VANCOMYCIN HCL 10 G IV SOLR
2000.0000 mg | Freq: Once | INTRAVENOUS | Status: AC
  Administered 2021-11-13: 2000 mg via INTRAVENOUS
  Filled 2021-11-13: qty 40

## 2021-11-13 MED ORDER — FENTANYL CITRATE (PF) 100 MCG/2ML IJ SOLN
INTRAMUSCULAR | Status: AC
  Filled 2021-11-13: qty 2

## 2021-11-13 MED ORDER — ONDANSETRON HCL 4 MG/2ML IJ SOLN
INTRAMUSCULAR | Status: AC
  Filled 2021-11-13: qty 2

## 2021-11-13 MED ORDER — FENTANYL CITRATE PF 50 MCG/ML IJ SOSY: 50.0000 ug | PREFILLED_SYRINGE | Freq: Once | INTRAMUSCULAR | Status: DC

## 2021-11-13 MED ORDER — ADULT MULTIVITAMIN W/MINERALS CH
1.0000 | ORAL_TABLET | Freq: Every day | ORAL | Status: DC
  Administered 2021-11-13 – 2021-11-23 (×11): 1
  Filled 2021-11-13 (×11): qty 1

## 2021-11-13 MED ORDER — BUSPIRONE HCL 10 MG PO TABS: 30.0000 mg | ORAL_TABLET | Freq: Three times a day (TID) | ORAL | Status: DC | PRN

## 2021-11-13 MED ORDER — MIDAZOLAM HCL 2 MG/2ML IJ SOLN
2.0000 mg | INTRAMUSCULAR | Status: AC | PRN
  Administered 2021-11-15 (×3): 2 mg via INTRAVENOUS
  Filled 2021-11-13 (×3): qty 2

## 2021-11-13 MED ORDER — HEPARIN (PORCINE) 25000 UT/250ML-% IV SOLN
1000.0000 [IU]/h | INTRAVENOUS | Status: DC
  Administered 2021-11-13: 1000 [IU]/h via INTRAVENOUS
  Filled 2021-11-13: qty 250

## 2021-11-13 MED ORDER — EPINEPHRINE HCL 5 MG/250ML IV SOLN IN NS
0.5000 ug/min | INTRAVENOUS | Status: DC
  Administered 2021-11-13: 6 ug/min via INTRAVENOUS

## 2021-11-13 MED ORDER — SODIUM CHLORIDE 0.9 % IV SOLN
2.0000 g | Freq: Three times a day (TID) | INTRAVENOUS | Status: DC
  Administered 2021-11-13 – 2021-11-14 (×2): 2 g via INTRAVENOUS
  Filled 2021-11-13 (×2): qty 12.5

## 2021-11-13 MED ORDER — FENTANYL CITRATE PF 50 MCG/ML IJ SOSY
100.0000 ug | PREFILLED_SYRINGE | Freq: Once | INTRAMUSCULAR | Status: AC
  Administered 2021-11-13: 100 ug via INTRAVENOUS

## 2021-11-13 MED ORDER — PROPOFOL 1000 MG/100ML IV EMUL
INTRAVENOUS | Status: AC
  Filled 2021-11-13: qty 100

## 2021-11-13 MED ORDER — MIDAZOLAM HCL 2 MG/2ML IJ SOLN
INTRAMUSCULAR | Status: AC
  Filled 2021-11-13: qty 6

## 2021-11-13 MED ORDER — IOHEXOL 350 MG/ML SOLN
75.0000 mL | Freq: Once | INTRAVENOUS | Status: AC | PRN
  Administered 2021-11-13: 75 mL via INTRAVENOUS

## 2021-11-13 MED ORDER — MIDAZOLAM HCL 2 MG/2ML IJ SOLN
INTRAMUSCULAR | Status: AC
  Filled 2021-11-13: qty 2

## 2021-11-13 MED ORDER — FENTANYL BOLUS VIA INFUSION
50.0000 ug | INTRAVENOUS | Status: DC | PRN
  Administered 2021-11-13: 100 ug via INTRAVENOUS

## 2021-11-13 MED ORDER — FENTANYL BOLUS VIA INFUSION
50.0000 ug | INTRAVENOUS | Status: DC | PRN
  Administered 2021-11-13 – 2021-11-17 (×6): 50 ug via INTRAVENOUS
  Administered 2021-11-18 (×5): 100 ug via INTRAVENOUS
  Administered 2021-11-18 – 2021-11-19 (×4): 50 ug via INTRAVENOUS
  Administered 2021-11-19 (×2): 100 ug via INTRAVENOUS

## 2021-11-13 MED ORDER — ETOMIDATE 2 MG/ML IV SOLN
INTRAVENOUS | Status: AC | PRN
  Administered 2021-11-13: 20 mg via INTRAVENOUS

## 2021-11-13 MED ORDER — MIDAZOLAM HCL 2 MG/2ML IJ SOLN
2.0000 mg | Freq: Once | INTRAMUSCULAR | Status: AC
  Administered 2021-11-13: 2 mg via INTRAVENOUS

## 2021-11-13 MED ORDER — FENTANYL 2500MCG IN NS 250ML (10MCG/ML) PREMIX INFUSION
50.0000 ug/h | INTRAVENOUS | Status: DC
  Administered 2021-11-13: 100 ug/h via INTRAVENOUS
  Filled 2021-11-13: qty 250

## 2021-11-13 MED ORDER — THIAMINE HCL 100 MG/ML IJ SOLN
100.0000 mg | Freq: Every day | INTRAMUSCULAR | Status: DC
  Administered 2021-11-13 – 2021-11-25 (×13): 100 mg via INTRAVENOUS
  Filled 2021-11-13 (×13): qty 2

## 2021-11-13 MED ORDER — VANCOMYCIN HCL 10 G IV SOLR: 1750.0000 mg | INTRAVENOUS | Status: DC

## 2021-11-13 MED ORDER — PANTOPRAZOLE 2 MG/ML SUSPENSION
40.0000 mg | Freq: Every day | ORAL | Status: DC
Start: 2021-11-13 — End: 2021-11-26
  Administered 2021-11-13 – 2021-11-26 (×14): 40 mg
  Filled 2021-11-13 (×15): qty 20

## 2021-11-13 MED ORDER — MAGNESIUM SULFATE 2 GM/50ML IV SOLN
2.0000 g | Freq: Once | INTRAVENOUS | Status: AC
Start: 2021-11-13 — End: 2021-11-13
  Administered 2021-11-13: 2 g via INTRAVENOUS
  Filled 2021-11-13: qty 50

## 2021-11-13 MED ORDER — ACETAMINOPHEN 10 MG/ML IV SOLN: 1000.0000 mg | Freq: Four times a day (QID) | INTRAVENOUS | Status: DC | PRN

## 2021-11-13 MED ORDER — PROPOFOL 1000 MG/100ML IV EMUL
0.0000 ug/kg/min | INTRAVENOUS | Status: DC
  Administered 2021-11-14 (×5): 50 ug/kg/min via INTRAVENOUS
  Administered 2021-11-14: 40 ug/kg/min via INTRAVENOUS
  Administered 2021-11-15 (×7): 50 ug/kg/min via INTRAVENOUS
  Administered 2021-11-16: 25 ug/kg/min via INTRAVENOUS
  Administered 2021-11-16: 50 ug/kg/min via INTRAVENOUS
  Administered 2021-11-16 – 2021-11-17 (×2): 25 ug/kg/min via INTRAVENOUS
  Filled 2021-11-13 (×17): qty 100

## 2021-11-13 MED ORDER — BUDESONIDE 0.25 MG/2ML IN SUSP
0.2500 mg | Freq: Two times a day (BID) | RESPIRATORY_TRACT | Status: DC
  Administered 2021-11-14 – 2021-11-26 (×26): 0.25 mg via RESPIRATORY_TRACT
  Filled 2021-11-13 (×26): qty 2

## 2021-11-13 MED ORDER — DOCUSATE SODIUM 50 MG/5ML PO LIQD: 100.0000 mg | Freq: Two times a day (BID) | ORAL | Status: DC | PRN

## 2021-11-13 MED ORDER — DOCUSATE SODIUM 50 MG/5ML PO LIQD
100.0000 mg | Freq: Two times a day (BID) | ORAL | Status: DC
Start: 2021-11-13 — End: 2021-11-24
  Administered 2021-11-13 – 2021-11-22 (×12): 100 mg
  Filled 2021-11-13 (×13): qty 10

## 2021-11-13 MED ORDER — POTASSIUM CHLORIDE 10 MEQ/100ML IV SOLN
10.0000 meq | INTRAVENOUS | Status: AC
  Administered 2021-11-13 – 2021-11-14 (×6): 10 meq via INTRAVENOUS
  Filled 2021-11-13 (×5): qty 100

## 2021-11-13 MED ORDER — SODIUM CHLORIDE 0.9 % IV SOLN: 250.0000 mL | INTRAVENOUS | Status: DC

## 2021-11-13 MED ORDER — SODIUM CHLORIDE 0.9 % IV SOLN
INTRAVENOUS | Status: AC | PRN
  Administered 2021-11-13: 1000 mL via INTRAVENOUS

## 2021-11-13 MED ORDER — NOREPINEPHRINE 4 MG/250ML-% IV SOLN
2.0000 ug/min | INTRAVENOUS | Status: DC
  Administered 2021-11-15: 10 ug/min via INTRAVENOUS
  Administered 2021-11-15: 8 ug/min via INTRAVENOUS
  Administered 2021-11-15: 2 ug/min via INTRAVENOUS
  Administered 2021-11-16: 6 ug/min via INTRAVENOUS
  Filled 2021-11-13 (×4): qty 250

## 2021-11-13 MED ORDER — ALBUTEROL SULFATE (2.5 MG/3ML) 0.083% IN NEBU
2.5000 mg | INHALATION_SOLUTION | RESPIRATORY_TRACT | Status: DC | PRN
  Administered 2021-11-25: 2.5 mg via RESPIRATORY_TRACT
  Filled 2021-11-13: qty 3

## 2021-11-13 MED ORDER — PROPOFOL 1000 MG/100ML IV EMUL: 0.0000 ug/kg/min | INTRAVENOUS | Status: DC

## 2021-11-13 MED ORDER — INSULIN ASPART 100 UNIT/ML IJ SOLN
0.0000 [IU] | INTRAMUSCULAR | Status: DC
  Administered 2021-11-13: 5 [IU] via SUBCUTANEOUS
  Administered 2021-11-14 – 2021-11-15 (×2): 1 [IU] via SUBCUTANEOUS
  Administered 2021-11-15: 2 [IU] via SUBCUTANEOUS
  Administered 2021-11-15 – 2021-11-16 (×2): 1 [IU] via SUBCUTANEOUS
  Administered 2021-11-16: 2 [IU] via SUBCUTANEOUS
  Administered 2021-11-16: 1 [IU] via SUBCUTANEOUS
  Administered 2021-11-16 (×2): 2 [IU] via SUBCUTANEOUS
  Administered 2021-11-17 (×3): 1 [IU] via SUBCUTANEOUS
  Administered 2021-11-18: 2 [IU] via SUBCUTANEOUS
  Administered 2021-11-18 – 2021-11-22 (×13): 1 [IU] via SUBCUTANEOUS
  Administered 2021-11-22: 2 [IU] via SUBCUTANEOUS
  Administered 2021-11-22 – 2021-11-24 (×11): 1 [IU] via SUBCUTANEOUS
  Administered 2021-11-24: 2 [IU] via SUBCUTANEOUS
  Administered 2021-11-24: 1 [IU] via SUBCUTANEOUS
  Administered 2021-11-25: 2 [IU] via SUBCUTANEOUS
  Administered 2021-11-25: 1 [IU] via SUBCUTANEOUS
  Administered 2021-11-25: 2 [IU] via SUBCUTANEOUS
  Administered 2021-11-25: 1 [IU] via SUBCUTANEOUS
  Administered 2021-11-26: 3 [IU] via SUBCUTANEOUS
  Administered 2021-11-26: 2 [IU] via SUBCUTANEOUS

## 2021-11-13 NOTE — ED Notes (Signed)
X-ray called to bedside for post intubation verification.

## 2021-11-13 NOTE — ED Notes (Addendum)
Trauma Event Note  Event Summary: Assisted primary RN with intake of post CPR patient, placed 18G PIV RAC, 20G R hand, assisted in intubation of the patient. Assisted with post intubation care of the patient (og tube, sedation medications)  Last imported Vital Signs BP (!) 89/50   Pulse (!) 108   Temp (!) 95.8 F (35.4 C)   Resp (!) 23   SpO2 99%   Trending CBC Recent Labs    11/13/21 1920  HGB 14.3  13.9  HCT 42.0  41.0    Trending Coag's No results for input(s): "APTT", "INR" in the last 72 hours.  Trending BMET Recent Labs    11/13/21 1920  NA 144  144  K 2.7*  2.7*  CL 107  BUN 4*  CREATININE 0.70  GLUCOSE 292*      Kara [name]  Trauma Response RN  Please call TRN at [phone] for further assistance.

## 2021-11-13 NOTE — Consult Note (Addendum)
Cardiology Consultation:   Patient ID: Kara [name] MRN: [id number]; DOB: May 09, 1985  Admit date: 11/13/2021 Date of Consult: 11/13/2021  PCP:  Kara Floro, MD   [facility] Providers Cardiologist:  previously followed by Dr. Eden [name], last seen in 2019   Patient Profile:   Kara [name] is a 36 y.o. 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  Both of her parents have history of heart failure.  Last echocardiogram obtained on 06/13/2016 demonstrated EF 60 to 65%, normal wall motion, trivial TR.  She had a exercise tolerance test on 04/23/2015 that showed no ischemic changes.  Patient was recently admitted was right lower extremity cellulitis on 11/04/2021 and was discharged on 11/07/2021.  Limited lower extremity venous Doppler was negative for DVT.  Hospital course was complicated by hypokalemia and hypomagnesemia.  It was felt she was not septic.  She was eventually discharged on cefadroxil and doxycycline for 7 days.  At the patient's request, she was given a limited prescription of Dilaudid for 2 days.  Since discharge, patient has been recovering well at home.  Unfortunately for the past 2 days, husband says patient has been having significant nausea and vomiting and could not keep any medication or food down.  This afternoon, patient's husband went out to walk the dog, prior to leaving home, patient was in normal  condition.  Upon returning, he went straight to the bathroom to take a shower.  When he came out of the shower around 5:35 PM, he saw the patient was in agonal breathing and immediately called 911.  Fire department arrived first and noted patient was in V. tach and started CPR and shocked the patient twice.  EMS followed soon after and shocked the patient 2 additional times.  During the CPR, patient received a total of 6 rounds of epi and at least round round of amiodarone.  She was intubated and sedated and currently on epi drip.  EKG shows sinus rhythm without prolonged QTc.  Unfortunately initial blood work shows potassium was 2.5.  This is down significantly from the previous potassium of 4.1 on 11/07/2021.  Note, patient has a separate epic account, see separate account for past medical history, past surgical history, family history and social history.  Home medication has been reviewed.  Home Medications:  Prior to Admission medications   Not on File    Inpatient Medications: Scheduled Meds:  fentaNYL       fentaNYL       midazolam       ondansetron       Continuous Infusions:  propofol     epinephrine 8 mcg/min (11/13/21 1915)   fentaNYL infusion INTRAVENOUS 100 mcg/hr (11/13/21 1934)   PRN Meds: propofol, fentaNYL, fentaNYL, fentaNYL, midazolam, ondansetron  Allergies:   Not on File  Social History:   Social History   Socioeconomic History   Marital status: Married    Spouse name: Not on file   Number of children: Not on file   Years of education:  Not on file   Highest education level: Not on file  Occupational History   Not on file  Tobacco Use   Smoking status: Not on file   Smokeless tobacco: Not on file  Substance and Sexual Activity   Alcohol use: Not on file   Drug use: Not on file   Sexual activity: Not on file  Other Topics Concern   Not on file  Social History Narrative   Not on file   Social Determinants of Health   Financial Resource Strain: Not on  file  Food Insecurity: Not on file  Transportation Needs: Not on file  Physical Activity: Not on file  Stress: Not on file  Social Connections: Not on file  Intimate Partner Violence: Not on file    Family History:   History reviewed. No pertinent family history.   ROS:  Please see the history of present illness.   All other ROS reviewed and negative.     Physical Exam/Data:   Vitals:   11/13/21 1915 11/13/21 1926 11/13/21 1930 11/13/21 1946  BP: (!) 89/50  107/76   Pulse: 96 (!) 108 98   Resp: 18 (!) 23 20   Temp:  (!) 95.8 F (35.4 C) (!) 97.1 F (36.2 C)   SpO2: 98% 99% 100% 96%  Height:    5\' 7"  (1.702 m)   No intake or output data in the 24 hours ending 11/13/21 2001     No data to display           There is no height or weight on file to calculate BMI.  General: Intubated  HEENT: Intubated Neck: no JVD Vascular: No carotid bruits; Distal pulses 2+ bilaterally Cardiac:  normal S1, S2; RRR; no murmur  Lungs: Intubated Abd: soft, no hepatomegaly  Ext: no edema Musculoskeletal:  No deformities Skin: warm and dry  Neuro: Unable to assess, intubated  Psych: Unable to assess  EKG:  The EKG was personally reviewed and demonstrates: Normal sinus rhythm, no significant ST-T wave changes.  QTc 463 Telemetry:  Telemetry was personally reviewed and demonstrates: Normal sinus rhythm, no significant ventricular ectopy  Relevant CV Studies:  Echo 06/13/2016 LV EF: 60% -   65%   -------------------------------------------------------------------  Indications:      786.05 Dyspnea.   -------------------------------------------------------------------  History:   PMH:  Diastolic heart failure  Syncope.  Risk factors:  Hypertension. Obese.   -------------------------------------------------------------------  Study Conclusions   - Left ventricle: The cavity size was normal. Systolic function was    normal. The estimated ejection fraction was in the range of 60%     to 65%. Wall motion was normal; there were no regional wall    motion abnormalities. Left ventricular diastolic function    parameters were normal.  - Tricuspid valve: There was trivial regurgitation.  - Pulmonary arteries: Systolic pressure could not be accurately    estimated.   Laboratory Data:  High Sensitivity Troponin:  No results for input(s): "TROPONINIHS" in the last 720 hours.   Chemistry Recent Labs  Lab 11/13/21 1920 11/13/21 1942  NA 144  144 144  K 2.7*  2.7* 2.5*  CL 107  --   GLUCOSE 292*  --   BUN 4*  --   CREATININE 0.70  --     No results for input(s): "PROT", "ALBUMIN", "AST", "ALT", "ALKPHOS", "BILITOT" in the last 168 hours. Lipids No results for input(s): "CHOL", "TRIG", "HDL", "LABVLDL", "LDLCALC", "CHOLHDL" in the last 168 hours.  Hematology Recent  Labs  Lab 11/13/21 1919 11/13/21 1920 11/13/21 1942  WBC 23.9*  --   --   RBC 4.07  --   --   HGB 13.0 14.3  13.9 12.9  HCT 41.0 42.0  41.0 38.0  MCV 100.7*  --   --   MCH 31.9  --   --   MCHC 31.7  --   --   RDW 15.3  --   --   PLT 330  --   --    Thyroid No results for input(s): "TSH", "FREET4" in the last 168 hours.  BNPNo results for input(s): "BNP", "PROBNP" in the last 168 hours.  DDimer No results for input(s): "DDIMER" in the last 168 hours.   Radiology/Studies:  DG Chest Portable 1 View  Result Date: 11/13/2021 CLINICAL DATA:  Status post cardiac arrest EXAM: PORTABLE CHEST 1 VIEW COMPARISON:  01/20/2021 FINDINGS: Cardiac shadow is prominent accentuated by the portable technique. Endotracheal tube is noted 3 cm above the carina. Gastric catheter is noted with the tip in the stomach although the proximal side port lies in the distal esophagus and should be advanced several cm. The lungs are hypoinflated without focal confluent infiltrate. Central vascular crowding is noted related to the poor inspiratory effort. No acute bony abnormality is seen. IMPRESSION: Tubes and lines as described.  Gastric catheter should be advanced deeper into the stomach. Overall poor inspiratory effort with vascular crowding. Electronically Signed   By: Alcide Clever M.D.   On: 11/13/2021 19:23     Assessment and Plan:   VT/V-fib arrest:   -Obtain echocardiogram, MD plan to do bedside echo as well   -Low suspicion for ACS, however patient has been having significant nausea and vomiting for the past 2 days, potassium was 2.5, VT/V-fib likely triggered by electrolyte derangement.  -PCCM to manage vent and monitor for neurological recovery, correct electrolyte derangement.  Severe hypokalemia: Potassium 2.5.  Patient has a history of hypokalemia.  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  Both blood pressure medication currently on hold given shock and pressor requirement  Prolonged QT: QT interval 460s  Persistent asthma  History of seizure  History of syncope: Previously followed by cardiology service, last visit in 2019, etiology behind previous syncope is unclear.  Echocardiogram has been normal, last echo February 2018   Risk Assessment/Risk Scores:                For questions or updates, please contact [facility] Please consult [email or web address] for contact info under    Signed, Azalee Course, [place]  11/13/2021 8:01 PM   Personally seen and examined. 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  Last  normal was 11/13/21.  Arrest happened sometime between 5 pm and 530 pm today.  Husband started compressions.  Shocked in the field by [employer] and EMS.  Given Amio and intubated.  Presently BP stable on 10 EPI.  Patient is intubated and sedated  Exam  notable for tachypnea on vent- improved at second evaluation, RRR, Soft abdomen, mechanical breath sounds.  Beside Echo Performed:  LVEF 65-70% no WMA Normal LV size and thickness No aortic root dissection No AI, Tri-leaflet aortic valve No MR, no TR Hyperdynamic RV function No pericardial effusion  Labs notable for K 2.5 being repleted Mg level is pending HS Troponin 111 EKG NSR with no qtc prolongation presention Tele: No ectopy, SR  Would recommend: OSH arrest with long down time; Cooling may be reasonable - will get formal Echo in AM - Getting K and Mg repletion Mag Level ordered daily - low concern for ischemic etiology - BB held for shock - discussed with husband that positive prognostic factors (age) and the negative factors (prolonged down time) and that this would be the time we would have to wait to see recovery - will follow along and with tele  CRITICAL CARE Performed by: Kristinia Leavy A Sorrel Cassetta  Total critical care time: 60 minutes. Critical care time was exclusive of separately billable procedures and treating other patients. Critical care was necessary to treat or prevent imminent or life-threatening deterioration. Critical care was time spent personally by me on the following activities: development of treatment plan with patient and/or surrogate as well as nursing, discussions with consultants, evaluation of patient's response to treatment, examination of patient, obtaining history from patient or surrogate, ordering and performing treatments and interventions, ordering and review of laboratory studies, ordering and review of radiographic studies, pulse oximetry and re-evaluation of patient's  condition.    Signed, Riley Lam, MD [place]  [facility]  11/13/2021 9:13 PM    Riley Lam, MD FASE Cardiologist [facility]  [address] [place], [address] [place], [place] [address] [phone]  9:13 PM

## 2021-11-13 NOTE — Progress Notes (Signed)
Pharmacy Antibiotic Note  Kara [name] is a 36 y.o. female for which pharmacy has been consulted for cefepime and vancomycin dosing for sepsis.  Patient with a history of HF, splenic vein thrombosis on Coumadin, pancreatitis related to alcohol, HTN, HLD, prolonged QT, asthma, seizure, anxiety. Patient presenting as a post-CPR patient.  Recent admission to [facility] for RLE cellulitis.  SCr 0.7 WBC 23.9; LA 7.9; T 98.5 F; HR 101; RR 30  Plan: Cefepime 2g q8hr Vancomycin 2000 mg once then 1750 mg q24hr (eAUC 482) unless change in renal function Trend WBC, Fever, Renal function, & Clinical course F/u cultures, clinical course, WBC, fever De-escalate when able Levels at steady state  Height: 5\' 7"  (170.2 cm) IBW/kg (Calculated) : 61.6  Temp (24hrs), Avg:97.6 F (36.4 C), Min:95.8 F (35.4 C), Max:98.5 F (36.9 C)  Recent Labs  Lab 11/13/21 1919 11/13/21 1920  WBC 23.9*  --   CREATININE 1.06* 0.70    CrCl cannot be calculated (Unknown ideal weight.).    Not on File  Antimicrobials this admission: vancomycin 7/28 >>  cefepime 7/28 >>   Microbiology results: Pending  Thank you for allowing pharmacy to be a part of this patient's care.  8/28, PharmD, BCPS 11/13/2021 9:14 PM ED Clinical Pharmacist -  [phone]

## 2021-11-13 NOTE — Progress Notes (Addendum)
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  Both parents have history of heart failure.  Last Echo 06/13/2016 = EF 60 to 65%, normal wall motion. Stress test 04/23/2015 =  no ischemic changes.    Recently a/w right lower extremity cellulitis on 11/04/2021 -DC on 11/07/2021.  Limited lower extremity venous Doppler was negative for DVT.  Hospital course complicated by hypokalemia and hypomagnesemia.  DC home on cefadroxil and doxycycline for 7 days +  Dilaudid  Now w/ nausea and vomiting x 2 days. Husband was away briefly, returned to find patient c/ agonal breathing -> EMS found pt in V. tach and started CPR + shock x2.  Got total of 6 rounds of epi + 1 amiodarone.  She was intubated and sedated and currently on epi drip.  EKG c/ sinus rhythm without prolonged QTc.  Potassium was 2.5, marked change from recent discharge  eICU Interventions  VT/V-fib arrest: -per cardiology, do not think this is ACS -correct K+ and Mg aggressively - f/u labs tonite  -obtain Echo  -Amiodarone and Epinephrine infusion for now -as electrolytes correct, would try to wean off Epi as it may lower VT threshold - can use Levophed preferentially if needed for BP  -f/u cardiology reccs.   2. Hypotension: - from post arrest/myocardial stunning in the setting of recent volume depletion  - wean Epi, prefer use of Levophed if possible for now just on Epi so will wean as tolerated.  -Albumin x1, Can repeat  -replete Mg/K+  -peripheral access and left IO line only- can use these for now, monitor for need for central access.    Severe hypokalemia:  -Potassium 2.5.  - from recent nausea and vomiting  -replete IV and PO    3.  Respiratory Failure  -vent support, limit auoPEEP given hx of Asthma  -Propofol for sedation, Fentanyl infusion- attempt to wean to prns to assess mentation   4. Neuro/Temperature management: - targeted Temp control- will use prn Tylenol IV for now, Artic Sun if this is unable to control -wean sedation in AM to assess neuro status         [employer] N Daisee Centner 11/13/2021, 9:45 PM

## 2021-11-13 NOTE — Procedures (Signed)
Patient Name: Kara [name]  MRN: [id number]  Epilepsy Attending: Charlsie Quest  Referring Physician/Provider: Lidia Collum, PA-C Date: 11/13/2021 Duration: 22.28 mins  Patient history: 36yo F s/p cardiac arrest. EEG to evaluate for seizure.  Level of alertness: comatose  AEDs during EEG study: Propofol, Versed  Technical aspects: This EEG study was done with scalp electrodes positioned according to the 10-20 International system of electrode placement. Electrical activity was reviewed with band pass filter of 1-70Hz , sensitivity of 7 uV/mm, display speed of 62mm/sec with a 60Hz  notched filter applied as appropriate. EEG data were recorded continuously and digitally stored.  Video monitoring was available and reviewed as appropriate.  Description: EEG showed continuous generalized background suppression. Hyperventilation and photic stimulation were not performed.     Of note, study was technically difficult due to significant myogenic artifact.  ABNORMALITY - Background suppression, generalized  IMPRESSION: This technically difficult study is suggestive of profound diffuse encephalopathy, nonspecific etiology. No seizures or epileptiform discharges were seen throughout the recording.  [name] 

## 2021-11-13 NOTE — Progress Notes (Signed)
Stat  EEG complete - results pending.  

## 2021-11-13 NOTE — ED Triage Notes (Signed)
Pt BIB [facility] from home as Post CPR, Fire started CPR & she was shocked 4 times they report husband said she was alone 15-20 minutes then he found her apneic & pulseless.[facility] came on scene & shocked an additional 2 times & was given a total of 6 Epi's, 300 Amio, 800 fluid, Epi gtt  was started at 6 mc/min, 100 of Fentanyl was given & 5 of versed in 18 g Rt AC. Upon arrival to ED she had a king airway & was ST, pulse of approx. 130 bpm & BP of 106/60, GCS 3.

## 2021-11-13 NOTE — Progress Notes (Signed)
Pt transported for TRA-A to 2H07 without any complications. RN at bedside.

## 2021-11-13 NOTE — H&P (Signed)
NAME:  Kara [name], MRN:  [id number], DOB:  December 20, 1985, LOS: 0 ADMISSION DATE:  11/13/2021, CONSULTATION DATE:  7/28 REFERRING MD:  Dr. 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  DVT ultrasound negative.  Patient discharged with ABX on 11/07/2021.  During hospital course patient with low K and mag.  During the past few days per husband patient has been nauseous and not able to take her medications.  She has been taking medication for nausea.  On 7/28 in the afternoon, patient's husband states he went out and about 20 minutes later found patient down on floor with agonal respirations.  He called 911 and started CPR.  Fire dept/EMS arrived finding patient to be in VT.  Multiple shocks delivered, epi and amnio given.  About 15 minutes until ROSC was obtained by EMS arrival.  Patient intubated and was given sedation.  Patient transferred to [facility] ED.  Upon arrival to [facility] on 7/28, patient intubated and sedated.  Patient on epi drip.  EKG with no ST elevation but prolonged QTc.  Cardiology consulted.  CXR with no signs of pneumonia; ETT in good position.  Pertinent ED labs: UDS WNL, UA WNL, WBC 23.9, K2.7, glucose 290, AST 178, alk phos 321, troponin 111, ABG 7.24, 45, 156, 19.  CT head and PE chest pending.  Other labs and cultures pending.  Patient noted to be having some posturing and possible myoclonic activity.  Versed given.  EEG ordered.  PCCM consulted for ICU admission.   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 rate (!) 28, height 5\' 7"  (1.702 m), SpO2 93 %.    Vent Mode: PRVC FiO2 (%):  [70 %-100 %] 70 % Set Rate:  [20 bmp-24 bmp] 24 bmp Vt Set:  [470 mL-490 mL] 490 mL PEEP:  [5 cmH20] 5 cmH20 Plateau Pressure:  [23 cmH20] 23 cmH20  No intake or output data in the 24 hours ending 11/13/21 2006 There were no vitals filed for this visit.  Examination: General:  critically ill appearing on mech vent HEENT: MM pink/moist; ETT in place Neuro: sedated; having UE posturing w/ possible myoclonic activity; Upward gaze; Pupils equal 4 mm sluggish to light CV: s1s2, tachy 120s, no m/r/g PULM:  dim clear BS bilaterally; on mech vent PRVC GI: soft, bsx4 active  Extremities: warm/dry, no edema; Right knee wound/cellulitis with small amount of drainage and no erythema Skin: no other rashes or lesions appreciated   Resolved Hospital Problem list    Assessment & Plan:  VT/Vfib Cardiac arrest: patient unseen for about 20 minutes; found down pulseless; found to be in VT shocked multiple times, given epi and amio; ROSC 15 after EMS arrived to seen. Per husband has been having N/V taking anti-nausea meds. Not taking her home meds  that he's aware that past few days due to this. Hx of long qt syndrome Hypokalemia Shock: post arrest; sepsis vs cardiogenic P: -will admit to ICU w/ continuous telemetry monitoring -continue pressors for MAP goal >65 -trend troponin and lactate -repeat ABG -repleting K and giving mag -check Mag; replete electrolytes as needed -K goal greater than 4; mag goal greater than 2 -Avoid QT prolonging agents -repeat bmp later tonight -echo -CT head -CTA chest; b/l dvt US -send  UDS, ethanol, salicylate, acetaminophen level -EEG -check cultures: bcx2, urine culture/UA, and trach culture -will start on broad spectrum antibiotics -check procalcitonin -CXR -TTM normothermia protocol in place  Acute respiratory failure due to above Hx of asthma: On Breo-Ellipta, abuterol, and singulair P: -LTVV strategy with tidal volumes of 6-8 cc/kg ideal body weight -repeat ABG in few hours -Wean PEEP/FiO2 for SpO2 >92% -VAP bundle in place -Daily SAT and SBT -PAD protocol in place -wean sedation for RASS goal 0 to -1 -Brovana and Pulmicort twice daily -As needed albuterol for wheezing  Acute encephalopathy: Possible myoclonic activity Hx of seizure?: no AED listed on home meds -UDS pending -UA negative P: -check ct head -check EEG -prn versed for seizure activity; continue propofol/fent for sedation per PAD protocol -limit sedating meds -Check ethanol, acetaminophen, salicylate level -Check ammonia and TSH  Leukocytosis Recent cellulitis RLE: discharged 11/07/2021 on doxy x 7 days but unable to take meds due to N/V P: -Check PCT -Check CXR -broad-spectrum antibiotics -Trend WBC/fever curve  Elevated LFTs P: -check RUQ [place] -Avoid hepatotoxic agents -Trend CMP  Hyperglycemia P: -Check A1c -SSI and CBG monitoring  Hx of chronic diastolic HF: last echo 2018 EF 60-65% P: -Cardiology consulted; appreciate recs -Limit fluids; strict I/os; daily weights -Hold antihypertensives -Check echo  Hx of splenic vein thrombosis: on warfarin P: -check PT, PTT, INR -consider starting heparin pending ct head results  Hx of anxiety: On quetiapine, topiramate, sertraline History of alcohol use Chronic pain: on oxy at home? P: -Hold home psych meds -Thiamine, MVI, folate -Monitor for signs of alcohol withdrawal -hold home pain meds; sedation above  HTN/HLD P: -Hold home spironolactone/crestor for now  Hx of pancreatitis P: -consdier resuming home  creon  GERD P: -PPI  Best Practice (right click and "Reselect all SmartList Selections" daily)   Diet/type: NPO w/ meds via tube DVT prophylaxis: SCD GI prophylaxis: PPI Lines: N/A Foley:  N/A Code Status:  full code Last date of multidisciplinary goals of care discussion [7/28 updated husband on current medical condition; would like for her to remain full code.]  Labs   CBC: Recent Labs  Lab 11/13/21 1919 11/13/21 1920 11/13/21 1942  WBC 23.9*  --   --   NEUTROABS 14.9*  --   --   HGB 13.0 14.3  13.9 12.9  HCT 41.0 42.0  41.0 38.0  MCV 100.7*  --   --   PLT 330  --   --     Basic Metabolic Panel: Recent Labs  Lab 11/13/21 1920 11/13/21 1942  NA 144  144 144  K 2.7*  2.7* 2.5*  CL 107  --   GLUCOSE 292*  --   BUN 4*  --   CREATININE 0.70  --    GFR: CrCl cannot be calculated (Unknown ideal weight.). Recent Labs  Lab 11/13/21 1919  WBC 23.9*    Liver Function Tests: No results for input(s): "AST", "ALT", "ALKPHOS", "BILITOT", "PROT", "ALBUMIN" in the last 168 hours. No results for input(s): "  LIPASE", "AMYLASE" in the last 168 hours. No results for input(s): "AMMONIA" in the last 168 hours.  ABG    Component Value Date/Time   PHART 7.246 (L) 11/13/2021 1942   PCO2ART 45.5 11/13/2021 1942   PO2ART 156 (H) 11/13/2021 1942   HCO3 19.8 (L) 11/13/2021 1942   TCO2 21 (L) 11/13/2021 1942   ACIDBASEDEF 7.0 (H) 11/13/2021 1942   O2SAT 99 11/13/2021 1942     Coagulation Profile: No results for input(s): "INR", "PROTIME" in the last 168 hours.  Cardiac Enzymes: No results for input(s): "CKTOTAL", "CKMB", "CKMBINDEX", "TROPONINI" in the last 168 hours.  HbA1C: No results found for: "HGBA1C"  CBG: No results for input(s): "GLUCAP" in the last 168 hours.  Review of Systems:   Patient is encephalopathic and/or intubated. Therefore history has been obtained from chart review.    Past Medical History:  She,  has no past medical history on file.    Surgical History:     Social History:      Family History:  Her family history is not on file.   Allergies Not on File   Home Medications  Prior to Admission medications   Not on File     Critical care time: 50 minutes    JD Anselm Lis [facility] 11/13/2021, 8:06 PM  Please see [email or web address] for pager details.  From 7A-7P if no response, please call [phone]. After hours, please call ELink [phone].

## 2021-11-13 NOTE — Progress Notes (Signed)
An USGPIV (ultrasound guided PIV) has been placed for short-term vasopressor infusion. A correctly placed ivWatch must be used when administering Vasopressors. Should this treatment be needed beyond 72 hours, central line access should be obtained.  It will be the responsibility of the bedside nurse to follow best practice to prevent extravasations.   ?

## 2021-11-13 NOTE — ED Provider Notes (Signed)
[facility] EMERGENCY DEPARTMENT Provider Note   CSN: [id number] Arrival date & time: 11/13/21  1854     History  Chief Complaint  Patient presents with   Post CPR    Kara [name] is a 36 y.o. female.  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  HTN, seizures who presents emergency department via EMS post CPR s/p ROSC.  According to the husband he had gone to show her earlier today and upon his return he found the patient on the bed not breathing and pulseless.  Patient had called EMS and upon their arrival they noted her to be in ventricular tachycardia and shocked her 4 times while given CPR.  EMS shocked the patient an additional 2 times and she was given ultimately 6 of epinephrine, 300 of amiodarone and 800 cc of fluids as well as started on IV drip.  She was also given 100 mcg of fentanyl and 5 of Versed.  A King airway was placed in the field.  History is otherwise limited secondary to patient condition.     Home Medications Prior to Admission medications   Not on File      Allergies    Patient has no allergy information on record.    Review of Systems   Review of Systems  Physical Exam Updated Vital Signs BP 109/87   Pulse (!) 105   Temp 98.1 F (36.7 C)   Resp (!) 24   Ht 5\' 7"  (1.702 m)   SpO2 97%  Physical Exam Vitals and nursing note reviewed.  Constitutional:      General: She is not in acute distress.    Appearance: She is well-developed.  HENT:     Head: Atraumatic.     Comments: No clear evidence of head trauma identified    Right Ear: External ear normal.     Left Ear: External ear normal.     Nose: Nose normal.     Mouth/Throat:     Mouth: Mucous membranes are moist.     Pharynx: Oropharynx is clear.     Comments: King airway in place upon arrival Eyes:     Conjunctiva/sclera:  Conjunctivae normal.     Comments: Pupils dilated bilaterally and moderately reactive  Cardiovascular:     Rate and Rhythm: Normal rate and regular rhythm.     Heart sounds: No murmur heard. Pulmonary:     Effort: Pulmonary effort is normal. No respiratory distress.     Breath sounds: Normal breath sounds.  Abdominal:     Palpations: Abdomen is soft.     Tenderness: There is no abdominal tenderness.  Musculoskeletal:        General: No swelling.     Cervical back: Neck supple.     Right lower leg: No edema.     Left lower leg: No edema.     Comments: Wound over right knee at site of prior hematoma removal.  No active puslike drainage or surrounding erythema.  Skin:    General: Skin is warm and dry.     Capillary Refill: Capillary refill takes less than 2 seconds.     Findings: No rash.  Neurological:     Mental Status: She is alert.     GCS: GCS eye subscore is 1. GCS verbal subscore is 2. GCS motor subscore is 5.  Psychiatric:  Mood and Affect: Mood normal.     ED Results / Procedures / Treatments   Labs (all labs ordered are listed, but only abnormal results are displayed) Labs Reviewed  CBC WITH DIFFERENTIAL/PLATELET - Abnormal; Notable for the following components:      Result Value   WBC 23.9 (*)    MCV 100.7 (*)    Neutro Abs 14.9 (*)    Lymphs Abs 6.9 (*)    Basophils Absolute 0.3 (*)    Abs Immature Granulocytes 0.81 (*)    All other components within normal limits  COMPREHENSIVE METABOLIC PANEL - Abnormal; Notable for the following components:   Potassium 2.7 (*)    CO2 16 (*)    Glucose, Bld 290 (*)    Creatinine, Ser 1.06 (*)    Calcium 8.2 (*)    Total Protein 6.4 (*)    Albumin 2.8 (*)    AST 178 (*)    Alkaline Phosphatase 321 (*)    Total Bilirubin 2.6 (*)    Anion gap 18 (*)    All other components within normal limits  URINALYSIS, ROUTINE W REFLEX MICROSCOPIC - Abnormal; Notable for the following components:   Color, Urine AMBER (*)     APPearance HAZY (*)    Ketones, ur 5 (*)    Protein, ur 100 (*)    Bacteria, UA RARE (*)    All other components within normal limits  LACTIC ACID, PLASMA - Abnormal; Notable for the following components:   Lactic Acid, Venous 7.9 (*)    All other components within normal limits  MAGNESIUM - Abnormal; Notable for the following components:   Magnesium 1.5 (*)    All other components within normal limits  SALICYLATE LEVEL - Abnormal; Notable for the following components:   Salicylate Lvl <7.0 (*)    All other components within normal limits  ACETAMINOPHEN LEVEL - Abnormal; Notable for the following components:   Acetaminophen (Tylenol), Serum <10 (*)    All other components within normal limits  AMMONIA - Abnormal; Notable for the following components:   Ammonia 142 (*)    All other components within normal limits  TSH - Abnormal; Notable for the following components:   TSH 11.491 (*)    All other components within normal limits  HEMOGLOBIN A1C - Abnormal; Notable for the following components:   Hgb A1c MFr Bld 4.5 (*)    All other components within normal limits  BRAIN NATRIURETIC PEPTIDE - Abnormal; Notable for the following components:   B Natriuretic Peptide 252.7 (*)    All other components within normal limits  PROTIME-INR - Abnormal; Notable for the following components:   Prothrombin Time 17.9 (*)    INR 1.5 (*)    All other components within normal limits  GLUCOSE, CAPILLARY - Abnormal; Notable for the following components:   Glucose-Capillary 258 (*)    All other components within normal limits  I-STAT VENOUS BLOOD GAS, ED - Abnormal; Notable for the following components:   pCO2, Ven 32.6 (*)    pO2, Ven 145 (*)    Bicarbonate 17.6 (*)    TCO2 19 (*)    Acid-base deficit 7.0 (*)    Potassium 2.7 (*)    Calcium, Ion 0.99 (*)    All other components within normal limits  I-STAT CHEM 8, ED - Abnormal; Notable for the following components:   Potassium 2.7 (*)    BUN  4 (*)    Glucose, Bld 292 (*)  Calcium, Ion 1.01 (*)    TCO2 16 (*)    All other components within normal limits  I-STAT ARTERIAL BLOOD GAS, ED - Abnormal; Notable for the following components:   pH, Arterial 7.246 (*)    pO2, Arterial 156 (*)    Bicarbonate 19.8 (*)    TCO2 21 (*)    Acid-base deficit 7.0 (*)    Potassium 2.5 (*)    Calcium, Ion 1.11 (*)    All other components within normal limits  POCT I-STAT 7, (LYTES, BLD GAS, ICA,H+H) - Abnormal; Notable for the following components:   pH, Arterial 7.309 (*)    pO2, Arterial 154 (*)    TCO2 21 (*)    Acid-base deficit 6.0 (*)    Potassium 2.7 (*)    Calcium, Ion 1.13 (*)    All other components within normal limits  TROPONIN I (HIGH SENSITIVITY) - Abnormal; Notable for the following components:   Troponin I (High Sensitivity) 111 (*)    All other components within normal limits  TROPONIN I (HIGH SENSITIVITY) - Abnormal; Notable for the following components:   Troponin I (High Sensitivity) 648 (*)    All other components within normal limits  CULTURE, BLOOD (ROUTINE X 2)  CULTURE, BLOOD (ROUTINE X 2)  URINE CULTURE  CULTURE, RESPIRATORY W GRAM STAIN  MRSA NEXT GEN BY PCR, NASAL  RAPID URINE DRUG SCREEN, HOSP PERFORMED  ETHANOL  PROCALCITONIN  HIV ANTIBODY (ROUTINE TESTING W REFLEX)  APTT  BLOOD GAS, ARTERIAL  TRIGLYCERIDES  TOPIRAMATE LEVEL  PROCALCITONIN  TRIGLYCERIDES  COMPREHENSIVE METABOLIC PANEL  CBC  MAGNESIUM  BLOOD GAS, ARTERIAL  BASIC METABOLIC PANEL  LACTIC ACID, PLASMA  HEPARIN LEVEL (UNFRACTIONATED)  I-STAT BETA HCG BLOOD, ED ([facility], [facility], [facility] ONLY)    EKG EKG Interpretation  Date/Time:  Friday November 13 2021 19:03:36 EDT Ventricular Rate:  106 PR Interval:  158 QRS Duration: 104 QT Interval:  348 QTC Calculation: 463 R Axis:   62 Text Interpretation: Sinus tachycardia Repol abnrm suggests ischemia, anterolateral Confirmed by Blanchie Dessert [id number]) on 11/13/2021 7:52:57 PM  Radiology EEG  adult  Result Date: 11/13/2021 Lora Havens, MD     11/13/2021 11:04 PM Patient Name: Kara [name] MRN: [id number] Epilepsy Attending: Lora Havens Referring Physician/Provider: Mick Sell, PA-C Date: 11/13/2021 Duration: Patient history: 36yo F s/p cardiac arrest. EEG to evaluate for seizure. Level of alertness: comatose AEDs during EEG study: Propofol, Versed Technical aspects: This EEG study was done with scalp electrodes positioned according to the 10-20 International system of electrode placement. Electrical activity was reviewed with band pass filter of 1-70Hz , sensitivity of 7 uV/mm, display speed of 2mm/sec with a 60Hz  notched filter applied as appropriate. EEG data were recorded continuously and digitally stored.  Video monitoring was available and reviewed as appropriate. Description: EEG showed continuous generalized background suppression. Hyperventilation and photic stimulation were not performed.   Of note, study was technically difficult due to significant myogenic artifact. ABNORMALITY - Background suppression, generalized IMPRESSION: This technically difficult study is suggestive of profound diffuse encephalopathy, nonspecific etiology. No seizures or epileptiform discharges were seen throughout the recording. Priyanka Barbra Sarks   US Abdomen Limited RUQ (LIVER/GB)  Result Date: 11/13/2021 CLINICAL DATA:  Elevated liver enzymes. EXAM: ULTRASOUND ABDOMEN LIMITED RIGHT UPPER QUADRANT COMPARISON:  Abdominal ultrasound 11/04/2021. FINDINGS: Gallbladder: The gallbladder is contracted. No gallstones are identified. Unable to evaluate Murphy sign per sonographer. No gallbladder wall thickening. There is a 19 mm shadowing area near the  common bile duct, indeterminate. Common bile duct: Diameter: 11 mm. Liver: No focal lesion identified. Enlarged. Increase in parenchymal echogenicity. Portal vein is patent on color Doppler imaging with normal direction of blood flow towards the liver. Other:  Limited examination secondary to body habitus and patient positioning. IMPRESSION: 1. Dilated common bile duct with possible choledocholithiasis. This can be further evaluated with MRCP or ERCP. 2. The gallbladder is contracted and not well seen. 3. Echogenic enlarged liver likely related to fatty infiltration. Electronically Signed   By: Darliss Cheney M.D.   On: 11/13/2021 22:53   CT HEAD WO CONTRAST ( )  Result Date: 11/13/2021 CLINICAL DATA:  Status post cardiac arrest EXAM: CT HEAD WITHOUT CONTRAST TECHNIQUE: Contiguous axial images were obtained from the base of the skull through the vertex without intravenous contrast. RADIATION DOSE REDUCTION: This exam was performed according to the departmental dose-optimization program which includes automated exposure control, adjustment of the mA and/or kV according to patient size and/or use of iterative reconstruction technique. COMPARISON:  01/20/2021 FINDINGS: Brain: No evidence of acute infarction, hemorrhage, hydrocephalus, extra-axial collection or mass lesion/mass effect. Vascular: No hyperdense vessel or unexpected calcification. Skull: Normal. Negative for fracture or focal lesion. Sinuses/Orbits: The visualized paranasal sinuses are essentially clear. The mastoid air cells are unopacified. Other: None. IMPRESSION: No evidence of acute intracranial abnormality. Electronically Signed   By: Charline Bills M.D.   On: 11/13/2021 21:35   CT Angio Chest PE W and/or Wo Contrast  Result Date: 11/13/2021 CLINICAL DATA:  Status post cardiac arrest EXAM: CT ANGIOGRAPHY CHEST WITH CONTRAST TECHNIQUE: Multidetector CT imaging of the chest was performed using the standard protocol during bolus administration of intravenous contrast. Multiplanar CT image reconstructions and MIPs were obtained to evaluate the vascular anatomy. RADIATION DOSE REDUCTION: This exam was performed according to the departmental dose-optimization program which includes automated exposure  control, adjustment of the mA and/or kV according to patient size and/or use of iterative reconstruction technique. CONTRAST:  78mL OMNIPAQUE IOHEXOL 350 MG/ML SOLN COMPARISON:  Chest radiograph dated 11/13/2021 FINDINGS: Cardiovascular: Satisfactory opacification of the bilateral pulmonary arteries to the segmental level. No evidence of pulmonary embolism. Although not tailored for evaluation of the thoracic aorta, there is no evidence thoracic aortic aneurysm or dissection. The heart is normal in size.  No pericardial effusion. Mediastinum/Nodes: No suspicious mediastinal lymphadenopathy. Visualized thyroid is unremarkable. Lungs/Pleura: Endotracheal tube terminates 8 mm above the carina. Upper Abdomen: Patchy opacities in the posterior upper lobes, favoring aspiration over atelectasis. Additional patchy opacities in the lingula and posterior lower lobes, favoring atelectasis. No suspicious pulmonary nodules. No pleural effusion or pneumothorax. Musculoskeletal: Visualized upper abdomen is notable for severe geographic hepatic steatosis and an enteric tube terminating in the proximal gastric body. Review of the MIP images confirms the above findings. IMPRESSION: No evidence of pulmonary embolism. Multifocal atelectasis with possible upper lobe aspiration. Endotracheal tube terminates 8 mm above the carina. Endotracheal tube terminates in the proximal gastric body. Electronically Signed   By: Charline Bills M.D.   On: 11/13/2021 21:34   DG Chest Portable 1 View  Result Date: 11/13/2021 CLINICAL DATA:  Status post cardiac arrest EXAM: PORTABLE CHEST 1 VIEW COMPARISON:  01/20/2021 FINDINGS: Cardiac shadow is prominent accentuated by the portable technique. Endotracheal tube is noted 3 cm above the carina. Gastric catheter is noted with the tip in the stomach although the proximal side port lies in the distal esophagus and should be advanced several cm. The lungs are hypoinflated without focal confluent  infiltrate. Central vascular crowding is noted related to the poor inspiratory effort. No acute bony abnormality is seen. IMPRESSION: Tubes and lines as described. Gastric catheter should be advanced deeper into the stomach. Overall poor inspiratory effort with vascular crowding. Electronically Signed   By: Inez Catalina M.D.   On: 11/13/2021 19:23    Procedures Procedure Name: Intubation Date/Time: 11/14/2021 1:44 AM  Performed by: Nelta Numbers, MDPre-anesthesia Checklist: Emergency Drugs available, Suction available, Timeout performed and Patient being monitored Oxygen Delivery Method: Ambu bag Preoxygenation: Pre-oxygenation with 100% oxygen Induction Type: IV induction Ventilation: Mask ventilation without difficulty Laryngoscope Size: Glidescope and 3 Grade View: Grade I Tube size: 7.5 mm Number of attempts: 1 Airway Equipment and Method: Rigid stylet and Video-laryngoscopy Placement Confirmation: ETT inserted through vocal cords under direct vision, Positive ETCO2, CO2 detector and Breath sounds checked- equal and bilateral Secured at: 26 cm Tube secured with: ETT holder        Medications Ordered in ED Medications  fentaNYL (SUBLIMAZE) 100 MCG/2ML injection (  Canceled Entry 11/13/21 1909)  ondansetron (ZOFRAN) 4 MG/2ML injection (  Not Given 11/13/21 1925)  fentaNYL (SUBLIMAZE) 100 MCG/2ML injection (  Not Given 11/13/21 1925)  midazolam (VERSED) 2 MG/2ML injection (  Not Given 11/13/21 1926)  EPINEPHrine (ADRENALIN) 5 mg in NS 250 mL (0.02 mg/mL) premix infusion (4 mcg/min Intravenous Rate/Dose Change 11/14/21 0200)  propofol (DIPRIVAN) 1000 MG/100ML infusion (0 mcg/kg/min  Hold 11/13/21 2021)  midazolam (VERSED) 2 MG/2ML injection (0 mg  Hold 11/13/21 2020)  albuterol (PROVENTIL) (2.5 MG/3ML) 0.083% nebulizer solution 2.5 mg (has no administration in time range)  0.9 %  sodium chloride infusion (250 mLs Intravenous Not Given 11/13/21 2205)  norepinephrine (LEVOPHED) 4mg  in  262mL (0.016 mg/mL) premix infusion (has no administration in time range)  thiamine (VITAMIN B1) injection 100 mg (100 mg Intravenous Given Q000111Q Q000111Q)  folic acid (FOLVITE) tablet 1 mg (1 mg Per Tube Given 11/13/21 2233)  multivitamin with minerals tablet 1 tablet (1 tablet Per Tube Given 11/13/21 2233)  potassium chloride 10 mEq in 100 mL IVPB (10 mEq Intravenous New Bag/Given 11/14/21 0135)  insulin aspart (novoLOG) injection 0-9 Units ( Subcutaneous Not Given 11/13/21 2345)  docusate (COLACE) 50 MG/5ML liquid 100 mg (100 mg Per Tube Given 11/13/21 2232)  polyethylene glycol (MIRALAX / GLYCOLAX) packet 17 g (17 g Per Tube Given 11/13/21 2234)  fentaNYL (SUBLIMAZE) injection 50 mcg (has no administration in time range)  fentaNYL 252mcg in NS 256mL (44mcg/ml) infusion-PREMIX (150 mcg/hr Intravenous Infusion Verify 11/14/21 0000)  fentaNYL (SUBLIMAZE) bolus via infusion 50-100 mcg (50 mcg Intravenous Bolus from Bag 11/14/21 0157)  midazolam (VERSED) injection 2 mg (has no administration in time range)  midazolam (VERSED) injection 2 mg (has no administration in time range)  pantoprazole sodium (PROTONIX) 40 mg/20 mL oral suspension 40 mg (40 mg Per Tube Given 11/13/21 2233)  propofol (DIPRIVAN) 1000 MG/100ML infusion (40 mcg/kg/min  80 kg (Order-Specific) Intravenous New Bag/Given 11/14/21 0123)  ceFEPIme (MAXIPIME) 2 g in sodium chloride 0.9 % 100 mL IVPB (0 g Intravenous Stopped 11/13/21 2340)  acetaminophen (OFIRMEV) IV 1,000 mg (has no administration in time range)  vancomycin (VANCOCIN) 1,750 mg in sodium chloride 0.9 % 500 mL IVPB (has no administration in time range)  arformoterol (BROVANA) nebulizer solution 15 mcg (15 mcg Nebulization Given 11/14/21 0007)  budesonide (PULMICORT) nebulizer solution 0.25 mg (0.25 mg Nebulization Given 11/14/21 0007)  heparin ADULT infusion 100 units/mL (25000 units/21mL) (0 Units/hr Intravenous Paused 11/13/21  2358)  fentaNYL (SUBLIMAZE) injection 100 mcg (100 mcg  Intravenous Given 11/13/21 1911)  0.9 %  sodium chloride infusion (0 mLs Intravenous Stopped 11/13/21 2006)  fentaNYL (SUBLIMAZE) injection (100 mcg Intravenous Given 11/13/21 1905)  midazolam (VERSED) injection (5 mg Intravenous Given 11/13/21 1907)  ondansetron (ZOFRAN) injection (4 mg Intravenous Given 11/13/21 1859)  etomidate (AMIDATE) injection (20 mg Intravenous Given 11/13/21 1901)  midazolam (VERSED) injection 2 mg (2 mg Intravenous Given 11/13/21 2004)  midazolam (VERSED) 2 MG/2ML injection (  Given 11/13/21 2048)  potassium chloride (KLOR-CON) packet 60 mEq (60 mEq Per Tube Given 11/13/21 2234)  magnesium sulfate IVPB 2 g 50 mL (0 g Intravenous Stopped 11/13/21 2317)  vancomycin (VANCOCIN) 2,000 mg in sodium chloride 0.9 % 500 mL IVPB ( Intravenous Infusion Verify 11/14/21 0000)  iohexol (OMNIPAQUE) 350 MG/ML injection 75 mL (75 mLs Intravenous Contrast Given 11/13/21 2128)    ED Course/ Medical Decision Making/ A&P                           Medical Decision Making Problems Addressed: Cardiac arrest [facility](HCC): complicated acute illness or injury with systemic symptoms that poses a threat to life or bodily functions Hypokalemia: complicated acute illness or injury with systemic symptoms that poses a threat to life or bodily functions Lactic acidosis: complicated acute illness or injury with systemic symptoms that poses a threat to life or bodily functions  Amount and/or Complexity of Data Reviewed Independent Historian: spouse and EMS External Data Reviewed: labs and notes. Labs: ordered. Radiology: ordered. ECG/medicine tests: ordered.  Risk Prescription drug management. Decision regarding hospitalization.   Patient is a 36 year old female presents emergency department as above.  On initial presentation patient is tachycardic at 104 and her pressures are initially low in the 70s systolic.  Patient was restarted on the epinephrine drip.  Upon blood pressure stabilization the King airway  was exchanged out for an ET tube.  See procedure note above.  In the setting of the patient's VT seen initially per EMS cardiology was consulted for this patient.  Critical care was also consulted for this patient for hospital admission.  Baseline laboratory work was ordered.  Chest x-ray confirmed ET tube placement but did not show any signs of focal confluent infiltrates.  There was central vascular crowding likely related to poor inspiratory effort.  Pregnancy test negative.  Initial CHEM panel showed a potassium of 2.7.  Bicarb 16 with anion gap 18 suggestive of underlying metabolic acidosis.  Lactate elevated at 7.9 likely explaining the metabolic acidosis.  AST elevated at 178.  ALT 43.  VBG showed normal pH of 7.342 without evidence of CO2 retention.  ABG with PaO2 156.  BNP elevated at 252.  Delta troponin was 111 and 648 respectively.  Magnesium slightly low at 1.5.  Ammonia elevated at 142.  CBC with leukocytosis of 23.9 but no evidence of anemia.  Ethanol level less than 10.  In the setting of the patient's significant hypokalemia is possible that this was triggered her VT/V-fib.  Plan for admission was discussed with husband at bedside who verbalized understanding was agreeable.  Please see inpatient provider notes for additional details regarding this patient's ongoing hospital course and management.        Final Clinical Impression(s) / ED Diagnoses Final diagnoses:  Cardiac arrest (HCC)  Hypokalemia  Lactic acidosis    Rx / DC Orders ED Discharge Orders     None  Nelta Numbers, MD 11/14/21 [id number]    Blanchie Dessert, MD 11/16/21 [phone]

## 2021-11-14 ENCOUNTER — Inpatient Hospital Stay (HOSPITAL_COMMUNITY): Payer: BC Managed Care – PPO

## 2021-11-14 DIAGNOSIS — R609 Edema, unspecified: Secondary | ICD-10-CM | POA: Diagnosis not present

## 2021-11-14 DIAGNOSIS — I469 Cardiac arrest, cause unspecified: Secondary | ICD-10-CM | POA: Diagnosis not present

## 2021-11-14 LAB — BASIC METABOLIC PANEL
Anion gap: 9 (ref 5–15)
BUN: 12 mg/dL (ref 6–20)
CO2: 20 mmol/L — ABNORMAL LOW (ref 22–32)
Calcium: 8 mg/dL — ABNORMAL LOW (ref 8.9–10.3)
Chloride: 113 mmol/L — ABNORMAL HIGH (ref 98–111)
Creatinine, Ser: 1.9 mg/dL — ABNORMAL HIGH (ref 0.44–1.00)
GFR, Estimated: 34 mL/min — ABNORMAL LOW (ref >60)
Glucose, Bld: 144 mg/dL — ABNORMAL HIGH (ref 70–99)
Potassium: 3.8 mmol/L (ref 3.5–5.1)
Sodium: 142 mmol/L (ref 135–145)

## 2021-11-14 LAB — CBC
HCT: 41.8 % (ref 36.0–46.0)
Hemoglobin: 13.1 g/dL (ref 12.0–15.0)
MCH: 32.1 pg (ref 26.0–34.0)
MCHC: 31.3 g/dL (ref 30.0–36.0)
MCV: 102.5 fL — ABNORMAL HIGH (ref 80.0–100.0)
Platelets: 180 10*3/uL (ref 150–400)
RBC: 4.08 MIL/uL (ref 3.87–5.11)
RDW: 15.5 % (ref 11.5–15.5)
WBC: 21.3 10*3/uL — ABNORMAL HIGH (ref 4.0–10.5)
nRBC: 0 % (ref 0.0–0.2)

## 2021-11-14 LAB — COMPREHENSIVE METABOLIC PANEL
ALT: 45 U/L — ABNORMAL HIGH (ref 0–44)
AST: 148 U/L — ABNORMAL HIGH (ref 15–41)
Albumin: 2.8 g/dL — ABNORMAL LOW (ref 3.5–5.0)
Alkaline Phosphatase: 265 U/L — ABNORMAL HIGH (ref 38–126)
Anion gap: 13 (ref 5–15)
BUN: 9 mg/dL (ref 6–20)
CO2: 20 mmol/L — ABNORMAL LOW (ref 22–32)
Calcium: 8.1 mg/dL — ABNORMAL LOW (ref 8.9–10.3)
Chloride: 111 mmol/L (ref 98–111)
Creatinine, Ser: 1.53 mg/dL — ABNORMAL HIGH (ref 0.44–1.00)
GFR, Estimated: 45 mL/min — ABNORMAL LOW (ref >60)
Glucose, Bld: 140 mg/dL — ABNORMAL HIGH (ref 70–99)
Potassium: 3.4 mmol/L — ABNORMAL LOW (ref 3.5–5.1)
Sodium: 144 mmol/L (ref 135–145)
Total Bilirubin: 2.2 mg/dL — ABNORMAL HIGH (ref 0.3–1.2)
Total Protein: 6.3 g/dL — ABNORMAL LOW (ref 6.5–8.1)

## 2021-11-14 LAB — LACTIC ACID, PLASMA: Lactic Acid, Venous: 5.6 mmol/L (ref 0.5–1.9)

## 2021-11-14 LAB — MRSA NEXT GEN BY PCR, NASAL: MRSA by PCR Next Gen: NOT DETECTED

## 2021-11-14 LAB — PROCALCITONIN
Procalcitonin: 1.44 ng/mL
Procalcitonin: 12.34 ng/mL

## 2021-11-14 LAB — GLUCOSE, CAPILLARY
Glucose-Capillary: 101 mg/dL — ABNORMAL HIGH (ref 70–99)
Glucose-Capillary: 113 mg/dL — ABNORMAL HIGH (ref 70–99)
Glucose-Capillary: 119 mg/dL — ABNORMAL HIGH (ref 70–99)
Glucose-Capillary: 124 mg/dL — ABNORMAL HIGH (ref 70–99)
Glucose-Capillary: 96 mg/dL (ref 70–99)
Glucose-Capillary: 98 mg/dL (ref 70–99)

## 2021-11-14 LAB — T4, FREE: Free T4: 1.24 ng/dL — ABNORMAL HIGH (ref 0.61–1.12)

## 2021-11-14 LAB — ECHOCARDIOGRAM COMPLETE
Height: 67 in
S' Lateral: 3.6 cm
Weight: 3206.37 oz

## 2021-11-14 LAB — HIV ANTIBODY (ROUTINE TESTING W REFLEX): HIV Screen 4th Generation wRfx: NONREACTIVE

## 2021-11-14 LAB — MAGNESIUM: Magnesium: 2.1 mg/dL (ref 1.7–2.4)

## 2021-11-14 LAB — TRIGLYCERIDES: Triglycerides: 180 mg/dL — ABNORMAL HIGH (ref <150)

## 2021-11-14 MED ORDER — VALPROATE SODIUM 100 MG/ML IV SOLN
500.0000 mg | Freq: Three times a day (TID) | INTRAVENOUS | Status: DC
  Administered 2021-11-14 – 2021-11-15 (×4): 500 mg via INTRAVENOUS
  Filled 2021-11-14 (×6): qty 5

## 2021-11-14 MED ORDER — ACETAMINOPHEN 325 MG PO TABS
650.0000 mg | ORAL_TABLET | Freq: Four times a day (QID) | ORAL | Status: DC
Start: 2021-11-14 — End: 2021-11-19
  Administered 2021-11-14 – 2021-11-19 (×21): 650 mg
  Filled 2021-11-14 (×21): qty 2

## 2021-11-14 MED ORDER — ENOXAPARIN SODIUM 40 MG/0.4ML IJ SOSY
40.0000 mg | PREFILLED_SYRINGE | INTRAMUSCULAR | Status: DC
  Administered 2021-11-14 – 2021-11-16 (×3): 40 mg via SUBCUTANEOUS
  Filled 2021-11-14 (×3): qty 0.4

## 2021-11-14 MED ORDER — SODIUM CHLORIDE 0.9 % IV SOLN
2.0000 g | Freq: Two times a day (BID) | INTRAVENOUS | Status: DC
  Administered 2021-11-14 – 2021-11-15 (×3): 2 g via INTRAVENOUS
  Filled 2021-11-14 (×3): qty 12.5

## 2021-11-14 MED ORDER — VANCOMYCIN HCL IN DEXTROSE 1-5 GM/200ML-% IV SOLN
1000.0000 mg | INTRAVENOUS | Status: DC
  Administered 2021-11-14: 1000 mg via INTRAVENOUS
  Filled 2021-11-14: qty 200

## 2021-11-14 MED ORDER — PROSOURCE TF PO LIQD
45.0000 mL | Freq: Two times a day (BID) | ORAL | Status: DC
Start: 2021-11-14 — End: 2021-11-14
  Administered 2021-11-14: 45 mL
  Filled 2021-11-14: qty 45

## 2021-11-14 MED ORDER — LACTULOSE 10 GM/15ML PO SOLN
30.0000 g | Freq: Once | ORAL | Status: AC
  Administered 2021-11-14: 30 g
  Filled 2021-11-14: qty 45

## 2021-11-14 MED ORDER — CHLORHEXIDINE GLUCONATE CLOTH 2 % EX PADS
6.0000 | MEDICATED_PAD | Freq: Every day | CUTANEOUS | Status: DC
  Administered 2021-11-14 – 2021-11-26 (×13): 6 via TOPICAL

## 2021-11-14 MED ORDER — GABAPENTIN 300 MG PO CAPS: 600.0000 mg | ORAL_CAPSULE | Freq: Three times a day (TID) | ORAL | Status: DC

## 2021-11-14 MED ORDER — ALBUMIN HUMAN 5 % IV SOLN
25.0000 g | Freq: Once | INTRAVENOUS | Status: AC
  Administered 2021-11-14: 25 g via INTRAVENOUS
  Filled 2021-11-14: qty 500

## 2021-11-14 MED ORDER — GABAPENTIN 250 MG/5ML PO SOLN
600.0000 mg | Freq: Three times a day (TID) | ORAL | Status: DC
Start: 2021-11-14 — End: 2021-11-14
  Administered 2021-11-14: 600 mg
  Filled 2021-11-14 (×3): qty 12

## 2021-11-14 MED ORDER — TOPIRAMATE 100 MG PO TABS
100.0000 mg | ORAL_TABLET | Freq: Two times a day (BID) | ORAL | Status: DC
  Administered 2021-11-14 – 2021-11-19 (×11): 100 mg
  Filled 2021-11-14 (×12): qty 1

## 2021-11-14 MED ORDER — GABAPENTIN 250 MG/5ML PO SOLN
300.0000 mg | Freq: Three times a day (TID) | ORAL | Status: DC
  Administered 2021-11-14 – 2021-11-19 (×14): 300 mg
  Filled 2021-11-14 (×16): qty 6

## 2021-11-14 MED ORDER — VITAL HIGH PROTEIN PO LIQD
1000.0000 mL | ORAL | Status: DC
  Administered 2021-11-14: 1000 mL

## 2021-11-14 MED ORDER — VITAL AF 1.2 CAL PO LIQD
1000.0000 mL | ORAL | Status: DC
  Administered 2021-11-15 – 2021-11-17 (×3): 1000 mL
  Filled 2021-11-14: qty 1000

## 2021-11-14 MED ORDER — POTASSIUM CHLORIDE 20 MEQ PO PACK
40.0000 meq | PACK | Freq: Once | ORAL | Status: AC
  Administered 2021-11-14: 40 meq
  Filled 2021-11-14: qty 2

## 2021-11-14 NOTE — Progress Notes (Signed)
ANTICOAGULATION CONSULT NOTE - Initial Consult  Pharmacy Consult for Heparin  Indication:  elevated troponin   Not on File  Patient Measurements: Height: 5\' 7"  (170.2 cm) IBW/kg (Calculated) : 61.6  Vital Signs: Temp: 98.1 F (36.7 C) (07/29 0045) BP: 119/88 (07/29 0045) Pulse Rate: 107 (07/29 0045)  Labs: Recent Labs    11/13/21 1919 11/13/21 1920 11/13/21 1942 11/13/21 2139 11/13/21 2211 11/13/21 2254  HGB 13.0 14.3  13.9 12.9  --   --  13.6  HCT 41.0 42.0  41.0 38.0  --   --  40.0  PLT 330  --   --   --   --   --   APTT  --   --   --   --  34  --   LABPROT  --   --   --   --  17.9*  --   INR  --   --   --   --  1.5*  --   CREATININE 1.06* 0.70  --   --   --   --   TROPONINIHS 111*  --   --  648*  --   --     CrCl cannot be calculated (Unknown ideal weight.).   Medical History: History reviewed. No pertinent past medical history.    Assessment: 36 y/o F s/p cardiac arrest with ROSC, starting heparin for elevated troponin, CBC/renal function ok, PTA meds reviewed.   Goal of Therapy:  Heparin level 0.3-0.7 units/ml Monitor platelets by anticoagulation protocol: Yes   Plan:  Start heparin at 1000 units/hr 0800 heparin level Daily CBC/heparin level Monitor for bleeding  30, PharmD, BCPS Clinical Pharmacist Phone: [phone]

## 2021-11-14 NOTE — Consult Note (Signed)
Neurology Consultation Reason for Consult: Epilepsy Referring Physician: Katrinka Blazing, D  CC: Seizures  History is obtained from: Chart review  HPI: Kara [name] is a 36 y.o. female with a history of seizures managed on Topamax and gabapentin.  She was on the gabapentin for neuropathy prior to seizures and the Topamax was added afterwards.  She comes back in because of a cardiac arrest.  Is felt to be likely due to electrolyte derangement.  Since admission, there was some question of posturing, and therefore LTM EEG was connected.  On my bedside review, no ongoing seizure activity.  She continues to be sedated.   ROS:  Unable to obtain due to altered mental status.   History reviewed. No pertinent past medical history.   History reviewed. No pertinent family history.   Social History:  has no history on file for tobacco use, alcohol use, and drug use.   Exam: Current vital signs: BP (!) 87/68   Pulse 88   Temp 98.4 F (36.9 C)   Resp (!) 24   Ht 5\' 7"  (1.702 m)   Wt 90.9 kg   SpO2 100%   BMI 31.39 kg/m  Vital signs in last 24 hours: Temp:  [95.8 F (35.4 C)-99.5 F (37.5 C)] 98.4 F (36.9 C) (07/29 1830) Pulse Rate:  [82-132] 88 (07/29 1830) Resp:  [18-30] 24 (07/29 1830) BP: (80-151)/(50-101) 87/68 (07/29 1830) SpO2:  [91 %-100 %] 100 % (07/29 1830) FiO2 (%):  [40 %-70 %] 40 % (07/29 1537) Weight:  [90.9 kg] 90.9 kg (07/29 0600)   Physical Exam  Intubated, sedated  Neuro: Mental Status: Patient is sedated, but she does open eyes partially to noxious stimulation, she orients towards examiner, but does not follow commands. Cranial Nerves: II: Does not blink to threat. Pupils are equal, round, and reactive to light.   III,IV, VI: EOMI without ptosis or diploplia.  V, VII: Blinks to eyelid stimulation bilaterally Motor: Withdrawals in all four extremities  sensory: As above  Cerebellar: Does not perform   I have reviewed labs in epic and the results  pertinent to this consultation are: Creatinine 1.9, up from 0.7  I have reviewed the images obtained: CT head-negative  Impression: 36 year old female with a history of epilepsy who presents with cardiac arrest.  I would favor restarting her home antiepileptics, given her AKI, I will reduce her gabapentin from 600 3 times daily to 300 3 times daily.  I will continue her Topamax at 100 twice daily.  She has also been started on Depakote, and we can continue this for now, though would likely favor increasing her Topamax rather than continuing this long-term.  We will continue long-term EEG, and can begin weaning sedation as tolerated.  Recommendations: 1) LTM EEG 2) gabapentin 300 mg 3 times daily (reduced due to renal function) 3) continue Topamax 100 mg twice daily 4) continue valproate 500 mg 3 times daily 5) neurology will continue to follow  This patient is critically ill and at significant risk of neurological worsening, death and care requires constant monitoring of vital signs, hemodynamics,respiratory and cardiac monitoring, neurological assessment, discussion with family, other specialists and medical decision making of high complexity. I spent 45 minutes of neurocritical care time  in the care of  this patient. This was time spent independent of any time provided by nurse practitioner or PA.  30, MD [facility] [phone]  If 7pm- 7am, please page neurology on call as listed in AMION. 11/14/2021  7:22 PM

## 2021-11-14 NOTE — Progress Notes (Signed)
NAME:  Kara [name], MRN:  [id number], DOB:  12-28-1985, LOS: 1 ADMISSION DATE:  11/13/2021, CONSULTATION DATE:  7/28 REFERRING MD:  Dr. 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  DVT ultrasound negative.  Patient discharged with ABX on 11/07/2021.  During hospital course patient with low K and mag.  During the past few days per husband patient has been nauseous and not able to take her medications.  She has been taking medication for nausea.  On 7/28 in the afternoon, patient's husband states he went out and about 20 minutes later found patient down on floor with agonal respirations.  He called 911 and started CPR.  Fire dept/EMS arrived finding patient to be in VT.  Multiple shocks delivered, epi and amnio given.  About 15 minutes until ROSC was obtained by EMS arrival.  Patient intubated and was given sedation.  Patient transferred to [facility] ED.  Upon arrival to [facility] on 7/28, patient intubated and sedated.  Patient on epi drip.  EKG with no ST elevation but prolonged QTc.  Cardiology consulted.  CXR with no signs of pneumonia; ETT in good position.  Pertinent ED labs: UDS WNL, UA WNL, WBC 23.9, K2.7, glucose 290, AST 178, alk phos 321, troponin 111, ABG 7.24, 45, 156, 19.  CT head and PE chest pending.  Other labs and cultures pending.  Patient noted to be having some posturing and possible myoclonic activity.  Versed given.  EEG ordered.  PCCM consulted for ICU admission.   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 Heavily sedated. Mother at bedside.  Objective   Blood pressure 95/82, pulse (!) 110, temperature 98.8 F (37.1 C), resp. rate (!) 24, height 5\' 7"  (1.702 m), weight 90.9 kg, SpO2 100 %.    Vent Mode: PRVC FiO2 (%):  [40 %-100 %] 40 % Set Rate:  [20 bmp-24 bmp] 24 bmp Vt Set:  [470 mL-490 mL] 490 mL PEEP:  [5 cmH20] 5 cmH20 Plateau Pressure:  [20 cmH20-24 cmH20] 24 cmH20   Intake/Output Summary (Last 24 hours) at 11/14/2021 4098 Last data filed at 11/14/2021 0600 Gross per 24 hour  Intake 2083.68 ml  Output --  Net 2083.68 ml   Filed Weights   11/13/21 2200 11/14/21 0600  Weight: 90.9 kg 90.9 kg    Examination: Pale woman in NAD Lungs clear, not triggering vent Gag intact Not withdrawing or following commands but heavily sedated Ext warm, has healing wound on R knee Heart sounds tachy Roving eye movements with upward gaze  K being repleted Repeat EKG pending  Resolved Hospital Problem list    Assessment & Plan:  VT/Vfib Cardiac arrest: patient unseen for about 20 minutes; found down pulseless; found to be in VT shocked multiple times, given epi and amio; ROSC 15 after EMS arrived to seen. Per husband has been having N/V taking anti-nausea meds. Not  taking her home meds that he's aware that past few days due to this.  UDS neg, alcohol neg. Hx of long qt syndrome Hypokalemia Shock: post arrest; sepsis vs cardiogenic - Avoid fevers - Recheck EKG - Check echo, LE duplex - Replete K/Mg - Lighten sedation today, if not following commands will do full TTM protocol  Acute respiratory failure due to above Hx of asthma: On Breo-Ellipta, abuterol, and singulair, no bronchospasm on vent - Nebs as ordered  Acute encephalopathy: Possible myoclonic activity Hx  of seizure: extensive recent hx per mother - Neurology consult for VEEG and help prognosticating  Leukocytosis Recent cellulitis RLE: discharged 11/07/2021 on doxy x 7 days but unable to take meds due to N/V Aspiration PNA - Wound care consult - Vanc/cefepime for now, f/u culture data  Elevated LFTs - f/u RUQ [place] - APAP limited to < 3g daily  Hx of chronic diastolic HF: last echo 2018 EF 60-65% - Echo pending  Hx of splenic vein thrombosis: previously on warfarin complicated by abdominal hematoma requiring IR intervention, not presently on warfarin - DC heparin  Hx of anxiety: On quetiapine, topiramate, sertraline History of alcohol use Chronic pain: on oxy at home? P: -Hold home psych meds -Thiamine, MVI, folate -Monitor for signs of alcohol withdrawal -hold home pain meds; sedation above  HTN/HLD P: -Hold home spironolactone/crestor for now  Hx of pancreatitis P: -PTA creon on hold  GERD P: -PPI  Best Practice (right click and "Reselect all SmartList Selections" daily)   Diet/type: TF DVT prophylaxis: lovenox ppx GI prophylaxis: PPI Lines: IO to be removed Foley: yes Code Status:  full code Last date of multidisciplinary goals of care discussion [7/28 updated husband on current medical condition; would like for her to remain full code.]  34 min cc time Myrla Halsted MD PCCM

## 2021-11-14 NOTE — Progress Notes (Signed)
Pharmacy Antibiotic Note  Kara [name] is a 36 y.o. female for which pharmacy has been consulted for cefepime and vancomycin dosing for sepsis.  Cr worsened overnight.  Plan: Adjust cefepime to 2g q12h Adjust vancomycin to 1000mg  q24h - est AUC 488  Height: 5\' 7"  (170.2 cm) Weight: 90.9 kg (200 lb 6.4 oz) IBW/kg (Calculated) : 61.6  Temp (24hrs), Avg:98.4 F (36.9 C), Min:95.8 F (35.4 C), Max:99.1 F (37.3 C)  Recent Labs  Lab 11/13/21 1919 11/13/21 1920 11/13/21 2122 11/14/21 0418  WBC 23.9*  --   --  21.3*  CREATININE 1.06* 0.70  --  1.53*  LATICACIDVEN  --   --  7.9* 5.6*     Estimated Creatinine Clearance: 58.3 mL/min (A) (by C-G formula based on SCr of 1.53 mg/dL (H)).    Not on File  Antimicrobials this admission: Vancomycin 7/28 >>  Cefepime 7/28 >>   Microbiology results: Pending   Thank you for allowing pharmacy to be a part of this patient's care.    8/28, PharmD, BCPS, [facility] Clinical Pharmacist [phone] Please check AMION for all [facility] Pharmacy numbers 11/14/2021

## 2021-11-14 NOTE — Progress Notes (Signed)
Progress Note  Patient Name: Kara [name] Date of Encounter: 11/14/2021  [facility] Cardiologist: None   Subjective   EEG, 36 year old with VT/VF arrest.  Has a history of prolonged QT.  Earlier off of the propofol was posturing, arms up.  Had large bowel movement.  Fighting somewhat against the ventilator.  Inpatient Medications    Scheduled Meds:  acetaminophen  650 mg Per Tube Q6H   arformoterol  15 mcg Nebulization BID   budesonide (PULMICORT) nebulizer solution  0.25 mg Nebulization BID   docusate  100 mg Per Tube BID   enoxaparin (LOVENOX) injection  40 mg Subcutaneous Q24H   feeding supplement (PROSource TF)  45 mL Per Tube BID   feeding supplement (VITAL HIGH PROTEIN)  1,000 mL Per Tube Q24H   fentaNYL (SUBLIMAZE) injection  50 mcg Intravenous Once   folic acid  1 mg Per Tube Daily   insulin aspart  0-9 Units Subcutaneous Q4H   multivitamin with minerals  1 tablet Per Tube Daily   pantoprazole sodium  40 mg Per Tube Daily   polyethylene glycol  17 g Per Tube Daily   thiamine (VITAMIN B1) injection  100 mg Intravenous Daily   Continuous Infusions:  sodium chloride     ceFEPime (MAXIPIME) IV     epinephrine Stopped (11/14/21 0400)   fentaNYL infusion INTRAVENOUS 150 mcg/hr (11/14/21 0650)   norepinephrine (LEVOPHED) Adult infusion     propofol (DIPRIVAN) infusion 50 mcg/kg/min (11/14/21 0440)   valproate sodium Stopped (11/14/21 0526)   vancomycin     PRN Meds: albuterol, fentaNYL, midazolam, midazolam   Vital Signs    Vitals:   11/14/21 0700 11/14/21 0731 11/14/21 0739 11/14/21 0800  BP: 102/85 95/82 95/82  (!) 109/92  Pulse: (!) 108 (!) 108 (!) 110 (!) 115  Resp: (!) 24 (!) 24 (!) 24 (!) 24  Temp: 98.6 F (37 C)   98.8 F (37.1 C)  SpO2: 100% 100% 100% 100%  Weight:      Height:        Intake/Output Summary (Last 24 hours) at 11/14/2021 0936 Last data filed at 11/14/2021 0600 Gross per 24 hour  Intake 2083.68 ml  Output --  Net 2083.68 ml       11/14/2021    6:00 AM 11/13/2021   10:00 PM  Last 3 Weights  Weight (lbs) 200 lb 6.4 oz 200 lb 6.4 oz  Weight (kg) 90.9 kg 90.9 kg      Telemetry    Sinus tachycardia, no ventricular tachycardia- Personally Reviewed  ECG    Initial EKG shows normal sinus rhythm no QT prolongation no ST segment elevation.- Personally Reviewed  Physical Exam   Sedate in bed.  EEG monitoring stickers being placed.  Labs    High Sensitivity Troponin:   Recent Labs  Lab 11/13/21 1919 11/13/21 2139  TROPONINIHS 111* 648*     Chemistry Recent Labs  Lab 11/13/21 1919 11/13/21 1920 11/13/21 1942 11/13/21 2139 11/13/21 2254 11/14/21 0418  NA 142 144  144 144  --  143 144  K 2.7* 2.7*  2.7* 2.5*  --  2.7* 3.4*  CL 108 107  --   --   --  111  CO2 16*  --   --   --   --  20*  GLUCOSE 290* 292*  --   --   --  140*  BUN 6 4*  --   --   --  9  CREATININE 1.06* 0.70  --   --   --  1.53*  CALCIUM 8.2*  --   --   --   --  8.1*  MG  --   --   --  1.5*  --  2.1  PROT 6.4*  --   --   --   --  6.3*  ALBUMIN 2.8*  --   --   --   --  2.8*  AST 178*  --   --   --   --  148*  ALT 43  --   --   --   --  45*  ALKPHOS 321*  --   --   --   --  265*  BILITOT 2.6*  --   --   --   --  2.2*  GFRNONAA >60  --   --   --   --  45*  ANIONGAP 18*  --   --   --   --  13    Lipids  Recent Labs  Lab 11/14/21 0418  TRIG 180*    Hematology Recent Labs  Lab 11/13/21 1919 11/13/21 1920 11/13/21 1942 11/13/21 2254 11/14/21 0418  WBC 23.9*  --   --   --  21.3*  RBC 4.07  --   --   --  4.08  HGB 13.0   < > 12.9 13.6 13.1  HCT 41.0   < > 38.0 40.0 41.8  MCV 100.7*  --   --   --  102.5*  MCH 31.9  --   --   --  32.1  MCHC 31.7  --   --   --  31.3  RDW 15.3  --   --   --  15.5  PLT 330  --   --   --  180   < > = values in this interval not displayed.   Thyroid  Recent Labs  Lab 11/13/21 2139  TSH 11.491*  FREET4 1.24*    BNP Recent Labs  Lab 11/13/21 1930  BNP 252.7*    DDimer No  results for input(s): "DDIMER" in the last 168 hours.   Radiology    DG Chest Port 1 View  Result Date: 11/14/2021 CLINICAL DATA:  35 year old female with history of acute respiratory failure. EXAM: PORTABLE CHEST 1 VIEW COMPARISON:  Chest x-ray 11/13/2021. FINDINGS: An endotracheal tube is in place with tip 1.6 cm above the carina. Nasogastric tube in position with tip in the mid body of the stomach and side port just distal to the gastroesophageal junction. Lung volumes are low. Bibasilar opacities, favored to reflect areas of subsegmental atelectasis, although some airspace consolidation (particularly in the medial left lung base) is not excluded, as there appears to be some air bronchograms. No consolidative airspace disease. No pleural effusions. No pneumothorax. No pulmonary nodule or mass noted. Pulmonary vasculature and the cardiomediastinal silhouette are within normal limits. IMPRESSION: 1. Support apparatus, as above. 2. Low lung volumes with bibasilar areas of atelectasis and/or consolidation (left-greater-than-right). Electronically Signed   By: Vinnie Langton M.D.   On: 11/14/2021 06:34   EEG adult  Result Date: 11/13/2021 Lora Havens, MD     11/14/2021  6:34 AM Patient Name: Kara [name] MRN: [id number] Epilepsy Attending: Lora Havens Referring Physician/Provider: Mick Sell, PA-C Date: 11/13/2021 Duration: 22.28 mins Patient history: 36yo F s/p cardiac arrest. EEG to evaluate for seizure. Level of alertness: comatose AEDs during EEG study: Propofol, Versed Technical aspects: This EEG study was done with scalp electrodes positioned according to  the 10-20 International system of electrode placement. Electrical activity was reviewed with band pass filter of 1-70Hz , sensitivity of 7 uV/mm, display speed of 46mm/sec with a 60Hz  notched filter applied as appropriate. EEG data were recorded continuously and digitally stored.  Video monitoring was available and reviewed as  appropriate. Description: EEG showed continuous generalized background suppression. Hyperventilation and photic stimulation were not performed.   Of note, study was technically difficult due to significant myogenic artifact. ABNORMALITY - Background suppression, generalized IMPRESSION: This technically difficult study is suggestive of profound diffuse encephalopathy, nonspecific etiology. No seizures or epileptiform discharges were seen throughout the recording. Priyanka   Annabelle Harman Abdomen Limited RUQ (LIVER/GB)  Result Date: 11/13/2021 CLINICAL DATA:  Elevated liver enzymes. EXAM: ULTRASOUND ABDOMEN LIMITED RIGHT UPPER QUADRANT COMPARISON:  Abdominal ultrasound 11/04/2021. FINDINGS: Gallbladder: The gallbladder is contracted. No gallstones are identified. Unable to evaluate Murphy sign per sonographer. No gallbladder wall thickening. There is a 19 mm shadowing area near the common bile duct, indeterminate. Common bile duct: Diameter: 11 mm. Liver: No focal lesion identified. Enlarged. Increase in parenchymal echogenicity. Portal vein is patent on color Doppler imaging with normal direction of blood flow towards the liver. Other: Limited examination secondary to body habitus and patient positioning. IMPRESSION: 1. Dilated common bile duct with possible choledocholithiasis. This can be further evaluated with MRCP or ERCP. 2. The gallbladder is contracted and not well seen. 3. Echogenic enlarged liver likely related to fatty infiltration. Electronically Signed   By: 11/06/2021 M.D.   On: 11/13/2021 22:53   CT HEAD WO CONTRAST (11/15/2021)  Result Date: 11/13/2021 CLINICAL DATA:  Status post cardiac arrest EXAM: CT HEAD WITHOUT CONTRAST TECHNIQUE: Contiguous axial images were obtained from the base of the skull through the vertex without intravenous contrast. RADIATION DOSE REDUCTION: This exam was performed according to the departmental dose-optimization program which includes automated exposure control,  adjustment of the mA and/or kV according to patient size and/or use of iterative reconstruction technique. COMPARISON:  01/20/2021 FINDINGS: Brain: No evidence of acute infarction, hemorrhage, hydrocephalus, extra-axial collection or mass lesion/mass effect. Vascular: No hyperdense vessel or unexpected calcification. Skull: Normal. Negative for fracture or focal lesion. Sinuses/Orbits: The visualized paranasal sinuses are essentially clear. The mastoid air cells are unopacified. Other: None. IMPRESSION: No evidence of acute intracranial abnormality. Electronically Signed   By: 03/22/2021 M.D.   On: 11/13/2021 21:35   CT Angio Chest PE W and/or Wo Contrast  Result Date: 11/13/2021 CLINICAL DATA:  Status post cardiac arrest EXAM: CT ANGIOGRAPHY CHEST WITH CONTRAST TECHNIQUE: Multidetector CT imaging of the chest was performed using the standard protocol during bolus administration of intravenous contrast. Multiplanar CT image reconstructions and MIPs were obtained to evaluate the vascular anatomy. RADIATION DOSE REDUCTION: This exam was performed according to the departmental dose-optimization program which includes automated exposure control, adjustment of the mA and/or kV according to patient size and/or use of iterative reconstruction technique. CONTRAST:  59mL OMNIPAQUE IOHEXOL 350 MG/ML SOLN COMPARISON:  Chest radiograph dated 11/13/2021 FINDINGS: Cardiovascular: Satisfactory opacification of the bilateral pulmonary arteries to the segmental level. No evidence of pulmonary embolism. Although not tailored for evaluation of the thoracic aorta, there is no evidence thoracic aortic aneurysm or dissection. The heart is normal in size.  No pericardial effusion. Mediastinum/Nodes: No suspicious mediastinal lymphadenopathy. Visualized thyroid is unremarkable. Lungs/Pleura: Endotracheal tube terminates 8 mm above the carina. Upper Abdomen: Patchy opacities in the posterior upper lobes, favoring aspiration over  atelectasis. Additional patchy opacities in  the lingula and posterior lower lobes, favoring atelectasis. No suspicious pulmonary nodules. No pleural effusion or pneumothorax. Musculoskeletal: Visualized upper abdomen is notable for severe geographic hepatic steatosis and an enteric tube terminating in the proximal gastric body. Review of the MIP images confirms the above findings. IMPRESSION: No evidence of pulmonary embolism. Multifocal atelectasis with possible upper lobe aspiration. Endotracheal tube terminates 8 mm above the carina. Endotracheal tube terminates in the proximal gastric body. Electronically Signed   By: Charline Bills M.D.   On: 11/13/2021 21:34   DG Chest Portable 1 View  Result Date: 11/13/2021 CLINICAL DATA:  Status post cardiac arrest EXAM: PORTABLE CHEST 1 VIEW COMPARISON:  01/20/2021 FINDINGS: Cardiac shadow is prominent accentuated by the portable technique. Endotracheal tube is noted 3 cm above the carina. Gastric catheter is noted with the tip in the stomach although the proximal side port lies in the distal esophagus and should be advanced several cm. The lungs are hypoinflated without focal confluent infiltrate. Central vascular crowding is noted related to the poor inspiratory effort. No acute bony abnormality is seen. IMPRESSION: Tubes and lines as described. Gastric catheter should be advanced deeper into the stomach. Overall poor inspiratory effort with vascular crowding. Electronically Signed   By: Alcide Clever M.D.   On: 11/13/2021 19:23    Cardiac Studies   Bedside echo EF 65 to 70% with no wall motion abnormalities.  No pericardial effusion.  Patient Profile     36 y.o. female with VT/VF arrest.  5 PM 7/28.  Husband started compressions.  Shocked in the field by EMS.  Amiodarone.  Hypokalemia noted on admission.  Initial troponin 111.  Assessment & Plan    Out of hospital cardiac arrest ventricular tachycardia/fibrillation/prolonged QT -QTc 536 ms this  morning.  Prolonged.  Avoid further QT prolonging agents. - Await formal echocardiogram -Agree low concern for ischemic etiology.  Home beta-blocker being held secondary to shock.  Was having nausea and vomiting and taking antinausea medications, likely Zofran.  Alcohol negative UDS negative.  Was not taking home medications for the past few days. - Discussion yesterday with husband about prognostic factors.  Elevated troponin - Up to 600 secondary to cardiac arrest.  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  Hypokalemia - Replete  Chronic pancreatitis history - Creon on hold  Discussed with family members.  CRITICAL CARE Performed by: Donato Schultz   Total critical care time: 35 minutes  Critical care time was exclusive of separately billable procedures and treating other patients.  Critical care was necessary to treat or prevent imminent or life-threatening deterioration.  Critical care was time spent personally by me on the following activities: development of treatment plan with patient and/or surrogate as well as nursing, discussions with consultants, evaluation of patient's response to treatment, examination of patient, obtaining history from patient or surrogate, ordering and performing treatments and interventions, ordering and review of laboratory studies, ordering and review of radiographic studies, pulse oximetry and re-evaluation of patient's condition.   For questions or updates, please contact [facility] Please consult [email or web address] for contact info under        Signed, Donato Schultz, MD  11/14/2021, 9:36 AM

## 2021-11-14 NOTE — Progress Notes (Signed)
Received from ED intubated with Epi, Prop, Fentanyl drip infusing. Unresponsive.Will continue to monitor.

## 2021-11-14 NOTE — Progress Notes (Signed)
LTM EEG hooked up and running - no initial skin breakdown - push button tested - neuro notified. [facility] monitoring.  

## 2021-11-14 NOTE — Progress Notes (Signed)
  Echocardiogram 2D Echocardiogram has been performed.  Delcie Roch 11/14/2021, 12:04 PM

## 2021-11-14 NOTE — Progress Notes (Signed)
[facility] ADULT ICU REPLACEMENT PROTOCOL   The patient does apply for the [facility] Adult ICU Electrolyte Replacment Protocol based on the criteria listed below:   1.Exclusion criteria: TCTS patients, ECMO patients, and Dialysis patients 2. Is GFR >/= 30 ml/min? Yes.    Patient's GFR today is 45 3. Is SCr </= 2? Yes.   Patient's SCr is 1.53 mg/dL 4. Did SCr increase >/= 0.5 in 24 hours? No. 5.Pt's weight >40kg  Yes.   6. Abnormal electrolyte(s): K 3.4  7. Electrolytes replaced per protocol 8.  Call MD STAT for K+ </= 2.5, Phos </= 1, or Mag </= 1 Physician:    Markus Daft A 11/14/2021 6:40 AM

## 2021-11-14 NOTE — Progress Notes (Signed)
Initial Nutrition Assessment  DOCUMENTATION CODES:   Not applicable  INTERVENTION:   Tube Feeds via OG tube: Transition to Vital AF 1.2 at 40 mL/hr and advance by 10 mL q4h to a goal rate of 70 mL/hr (1680 mL/day) Provides 2016 kcal, 126 gm of protein, and 1362 mL free water daily.  Continue Multivitamin w/ minerals daily  NUTRITION DIAGNOSIS:   Inadequate oral intake related to inability to eat as evidenced by NPO status.  GOAL:   Patient will meet greater than or equal to 90% of their needs  MONITOR:   Vent status, Labs, I & O's, TF tolerance  REASON FOR ASSESSMENT:   Consult, Ventilator Assessment of nutrition requirement/status  ASSESSMENT:   36 y.o. female presented to the ED after cardiac arrest. Pt recently discharged on 7/22 from [facility] for RLE cellulitis. PMH includes HLD, GERD, CHF, EtOH abuse, HTN, and seizures. Pt admitted with V. Fib cardiac arrest, acute respiratory failure, and acute encephalopathy.   Unable to obtain any nutrition related history due to pt on the VENT and RD working remotely. RD assessed imaging of OG tube placement, side port of tube is located at the GE junction; recommend holding current TF and advancing tube, then obtaining additional x-ray. Tube was advanced and repeat imaging was obtained.  Patient is currently intubated on ventilator support MV: 16.6 L/min MAP (cuff): 79 Temp (24hrs), Avg:98.5 F (36.9 C), Min:95.8 F (35.4 C), Max:99.5 F (37.5 C) Continuous Medications: Fentanyl Propofol: 24 ml/hr (634 kcal/day)  Medications reviewed and include: Colace, Folic acid, NovoLog, MVI, Miralax, Thiamine, IV antibiotics Labs reviewed: Potassium 3.4, Creatinine 1.53, 24 hr CBG 98-258  NUTRITION - FOCUSED PHYSICAL EXAM:  Deferred to follow-up.   Diet Order:   Diet Order             Diet NPO time specified Except for: Other (See Comments)  Diet effective now                   EDUCATION NEEDS:   Not appropriate for  education at this time  Skin:  Skin Assessment: Reviewed RN Assessment  Last BM:  7/29  Height:   Ht Readings from Last 1 Encounters:  11/13/21 5\' 7"  (1.702 m)    Weight:   Wt Readings from Last 1 Encounters:  11/14/21 90.9 kg    Ideal Body Weight:  61.4 kg  BMI:  Body mass index is 31.39 kg/m.  Estimated Nutritional Needs:   Kcal:  2000-2200  Protein:  100-115 grams  Fluid:  >/= 2 L    11/16/21 RD, LDN Clinical Dietitian See [facility] for contact information.

## 2021-11-14 NOTE — Progress Notes (Signed)
VASCULAR LAB    Bilateral lower extremity venous duplex has been performed.  See CV proc for preliminary results.   Ayrabella Labombard, RVT 11/14/2021, 4:26 PM

## 2021-11-15 ENCOUNTER — Inpatient Hospital Stay (HOSPITAL_COMMUNITY): Payer: BC Managed Care – PPO

## 2021-11-15 DIAGNOSIS — I469 Cardiac arrest, cause unspecified: Secondary | ICD-10-CM | POA: Diagnosis not present

## 2021-11-15 DIAGNOSIS — R4182 Altered mental status, unspecified: Secondary | ICD-10-CM

## 2021-11-15 DIAGNOSIS — G9340 Encephalopathy, unspecified: Secondary | ICD-10-CM | POA: Diagnosis not present

## 2021-11-15 LAB — CBC
HCT: 36.8 % (ref 36.0–46.0)
Hemoglobin: 11.5 g/dL — ABNORMAL LOW (ref 12.0–15.0)
MCH: 31.9 pg (ref 26.0–34.0)
MCHC: 31.3 g/dL (ref 30.0–36.0)
MCV: 101.9 fL — ABNORMAL HIGH (ref 80.0–100.0)
Platelets: 88 K/uL — ABNORMAL LOW (ref 150–400)
RBC: 3.61 MIL/uL — ABNORMAL LOW (ref 3.87–5.11)
RDW: 15.3 % (ref 11.5–15.5)
WBC: 8.5 K/uL (ref 4.0–10.5)
nRBC: 0 % (ref 0.0–0.2)

## 2021-11-15 LAB — BASIC METABOLIC PANEL WITH GFR
Anion gap: 8 (ref 5–15)
BUN: 17 mg/dL (ref 6–20)
CO2: 21 mmol/L — ABNORMAL LOW (ref 22–32)
Calcium: 8.2 mg/dL — ABNORMAL LOW (ref 8.9–10.3)
Chloride: 111 mmol/L (ref 98–111)
Creatinine, Ser: 2.66 mg/dL — ABNORMAL HIGH (ref 0.44–1.00)
GFR, Estimated: 23 mL/min — ABNORMAL LOW
Glucose, Bld: 129 mg/dL — ABNORMAL HIGH (ref 70–99)
Potassium: 4 mmol/L (ref 3.5–5.1)
Sodium: 140 mmol/L (ref 135–145)

## 2021-11-15 LAB — GLUCOSE, CAPILLARY
Glucose-Capillary: 80 mg/dL (ref 70–99)
Glucose-Capillary: 112 mg/dL — ABNORMAL HIGH (ref 70–99)
Glucose-Capillary: 134 mg/dL — ABNORMAL HIGH (ref 70–99)
Glucose-Capillary: 134 mg/dL — ABNORMAL HIGH (ref 70–99)
Glucose-Capillary: 124 mg/dL — ABNORMAL HIGH (ref 70–99)
Glucose-Capillary: 153 mg/dL — ABNORMAL HIGH (ref 70–99)

## 2021-11-15 LAB — URINE CULTURE: Culture: NO GROWTH

## 2021-11-15 LAB — HEPATIC FUNCTION PANEL
ALT: 36 U/L (ref 0–44)
AST: 87 U/L — ABNORMAL HIGH (ref 15–41)
Albumin: 2.4 g/dL — ABNORMAL LOW (ref 3.5–5.0)
Alkaline Phosphatase: 220 U/L — ABNORMAL HIGH (ref 38–126)
Bilirubin, Direct: 1.3 mg/dL — ABNORMAL HIGH (ref 0.0–0.2)
Indirect Bilirubin: 1 mg/dL — ABNORMAL HIGH (ref 0.3–0.9)
Total Bilirubin: 2.3 mg/dL — ABNORMAL HIGH (ref 0.3–1.2)
Total Protein: 5.9 g/dL — ABNORMAL LOW (ref 6.5–8.1)

## 2021-11-15 LAB — AMMONIA: Ammonia: 53 umol/L — ABNORMAL HIGH (ref 9–35)

## 2021-11-15 LAB — PHOSPHORUS: Phosphorus: 2.6 mg/dL (ref 2.5–4.6)

## 2021-11-15 LAB — MAGNESIUM: Magnesium: 1.9 mg/dL (ref 1.7–2.4)

## 2021-11-15 LAB — T3, FREE: T3, Free: 2 pg/mL (ref 2.0–4.4)

## 2021-11-15 MED ORDER — ORAL CARE MOUTH RINSE
15.0000 mL | OROMUCOSAL | Status: DC
  Administered 2021-11-15 – 2021-11-21 (×68): 15 mL via OROMUCOSAL

## 2021-11-15 MED ORDER — POLYVINYL ALCOHOL 1.4 % OP SOLN
1.0000 [drp] | OPHTHALMIC | Status: DC | PRN
  Administered 2021-11-16: 1 [drp] via OPHTHALMIC
  Filled 2021-11-15: qty 15

## 2021-11-15 MED ORDER — MAGNESIUM SULFATE IN D5W 1-5 GM/100ML-% IV SOLN
1.0000 g | Freq: Once | INTRAVENOUS | Status: AC
  Administered 2021-11-15: 1 g via INTRAVENOUS
  Filled 2021-11-15 (×2): qty 100

## 2021-11-15 MED ORDER — LACTATED RINGERS IV SOLN: INTRAVENOUS | Status: DC

## 2021-11-15 MED ORDER — ORAL CARE MOUTH RINSE: 15.0000 mL | OROMUCOSAL | Status: DC | PRN

## 2021-11-15 NOTE — Progress Notes (Signed)
EEG maint complete.  ?

## 2021-11-15 NOTE — Progress Notes (Signed)
NAME:  Kara [name], MRN:  [id number], DOB:  06/23/1985, LOS: 2 ADMISSION DATE:  11/13/2021, CONSULTATION DATE:  7/28 REFERRING MD:  Dr. 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  DVT ultrasound negative.  Patient discharged with ABX on 11/07/2021.  During hospital course patient with low K and mag.  During the past few days per husband patient has been nauseous and not able to take her medications.  She has been taking medication for nausea.  On 7/28 in the afternoon, patient's husband states he went out and about 20 minutes later found patient down on floor with agonal respirations.  He called 911 and started CPR.  Fire dept/EMS arrived finding patient to be in VT.  Multiple shocks delivered, epi and amnio given.  About 15 minutes until ROSC was obtained by EMS arrival.  Patient intubated and was given sedation.  Patient transferred to [facility] ED.  Upon arrival to [facility] on 7/28, patient intubated and sedated.  Patient on epi drip.  EKG with no ST elevation but prolonged QTc.  Cardiology consulted.  CXR with no signs of pneumonia; ETT in good position.  Pertinent ED labs: UDS WNL, UA WNL, WBC 23.9, K2.7, glucose 290, AST 178, alk phos 321, troponin 111, ABG 7.24, 45, 156, 19.  CT head and PE chest pending.  Other labs and cultures pending.  Patient noted to be having some posturing and possible myoclonic activity.  Versed given.  EEG ordered.  PCCM consulted for ICU admission.   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 Heavily sedated. Mother at bedside.  Objective   Blood pressure 103/68, pulse 91, temperature 98.2 F (36.8 C), resp. rate (!) 24, height 5\' 7"  (1.702 m), weight 96.1 kg, SpO2 98 %.    Vent Mode: PRVC FiO2 (%):  [40 %] 40 % Set Rate:  [24 bmp] 24 bmp Vt Set:  [490 mL] 490 mL PEEP:  [5 cmH20] 5 cmH20 Plateau Pressure:  [20 cmH20-24 cmH20] 20 cmH20   Intake/Output Summary (Last 24 hours) at 11/15/2021 0925 Last data filed at 11/15/2021 0900 Gross per 24 hour  Intake 2574.88 ml  Output 133 ml  Net 2441.88 ml    Filed Weights   11/13/21 2200 11/14/21 0600 11/15/21 0500  Weight: 90.9 kg 90.9 kg 96.1 kg    Examination: No distress Heart sounds regular, ext warm Foley with dark urine Lung sounds diminished left base otherwise suprisingly clear Heavily sedated this am, was apparently nodding and tracking last night?  Cr up All cell lines down on CBC  Resolved Hospital Problem list    Hx of splenic vein thrombosis: previously on warfarin complicated by abdominal hematoma requiring IR intervention, not presently on warfarin  Shock liver- improved  Assessment & Plan:  VT/Vfib Cardiac arrest: patient unseen for about 20 minutes; found down pulseless; found to be in VT shocked multiple  times, given epi and amio; ROSC 15 after EMS arrived to seen. Per husband has been having N/V taking anti-nausea meds. Not taking her home meds that he's aware that past few days due to this.  UDS neg, alcohol neg. Hx of long qt syndrome Hypokalemia Hx seizures Post arrest encephalopathy - TTM2, lighten sedation tomorrow (48h) - Avoid QT prolonging agents - EP consult depending on neuro recovery - vEEG, appreciate neuro recs  Acute respiratory failure due to above Hx of asthma:  On Breo-Ellipta, abuterol, and singulair, no bronchospasm on vent - Nebs as ordered - Vent support, VAP prevention bundle  Leukocytosis Recent cellulitis RLE: discharged 11/07/2021 on doxy x 7 days but unable to take meds due to N/V Aspiration PNA - f/u wound care recs - Hold further vanc, continue cefepime for now, f/u culture data  Hx of chronic diastolic HF: last echo 2018 EF 60-65% Likely Type II NSTEMI - Echo down a bit - Nothing acute to do here  Hx of anxiety: On quetiapine, topiramate, sertraline History of alcohol use Chronic pain: on oxy at home? -Hold home psych meds indefinitely for now -Thiamine, MVI, folate -Monitor for signs of alcohol withdrawal -hold home pain meds; sedation above  HTN/HLD -Hold home spironolactone/crestor for now  Hx of pancreatitis -PTA creon on hold  Choledocholithiasis- LFTs improving c/w shock liver, consider further workup only if symptomatic  GERD -PPI  Post arrest acute kidney injury- due to hypotension - Avoid nephrotoxins - Check renal US - Gentle hydration - Mg repleted  Thrombocytopenia- all cell lines down, keep an eye on for now; lovenox okay to continue for now but may need to switch to heparin if Cr worsens  Consulting services: wound care, neuro, cardiology  Best Practice (right click and "Reselect all SmartList Selections" daily)   Diet/type: TF DVT prophylaxis: lovenox ppx GI prophylaxis: PPI Lines: IO to be removed Foley: yes Code Status:  full code Last date of multidisciplinary goals of care discussion [7/30 husband and mother updated at bedside.]  44 min cc time Myrla Halsted MD PCCM

## 2021-11-15 NOTE — Progress Notes (Signed)
21*  GLUCOSE 290*   < >  --   --  140* 144* 129*  BUN 6   < >  --   --  9 12 17   CREATININE 1.06*   < >  --   --  1.53* 1.90* 2.66*  CALCIUM 8.2*  --   --   --  8.1* 8.0* 8.2*  MG  --   --  1.5*  --  2.1  --  1.9  PROT 6.4*  --   --   --  6.3*  --  5.9*  ALBUMIN 2.8*  --   --   --  2.8*  --  2.4*  AST 178*  --   --   --  148*  --  87*  ALT 43  --   --   --  45*  --  36  ALKPHOS 321*  --   --   --  265*  --  220*  BILITOT 2.6*  --   --   --  2.2*  --  2.3*  GFRNONAA >60  --   --   --  45* 34* 23*  ANIONGAP 18*  --   --   --  13 9 8    < > = values in this interval not displayed.    Lipids  Recent Labs  Lab 11/14/21 0418  TRIG 180*    Hematology Recent Labs  Lab 11/13/21 1919 11/13/21 1920 11/13/21 2254 11/14/21 0418 11/15/21 0623  WBC 23.9*  --   --  21.3* 8.5  RBC 4.07  --   --  4.08 3.61*  HGB 13.0   < > 13.6 13.1 11.5*  HCT 41.0   < > 40.0 41.8 36.8  MCV 100.7*  --   --  102.5* 101.9*  MCH 31.9  --   --  32.1 31.9  MCHC 31.7  --   --  31.3  31.3  RDW 15.3  --   --  15.5 15.3  PLT 330  --   --  180 88*   < > = values in this interval not displayed.   Thyroid  Recent Labs  Lab 11/13/21 2139  TSH 11.491*  FREET4 1.24*    BNP Recent Labs  Lab 11/13/21 1930  BNP 252.7*    DDimer No results for input(s): "DDIMER" in the last 168 hours.   Radiology    VAS [place] LOWER EXTREMITY VENOUS (DVT)  Result Date: 11/14/2021  Lower Venous DVT Study Patient Name:  Kara [name]  Date of Exam:   11/14/2021 Medical Rec #: [id number]      Accession #:    [id number] Date of Birth: Nov 22, 1985       Patient Gender: F Patient Age:   36 years Exam Location:  [facility] Procedure:      VAS [place] LOWER EXTREMITY VENOUS (DVT) Referring Phys: Jenny Reichmann PAYNE --------------------------------------------------------------------------------  Indications: Edema. Other Indications: Status post cardiac arrest. Comparison Study: No prior study on file Performing Technologist: Sharion Dove RVS  Examination Guidelines: A complete evaluation includes B-mode imaging, spectral Doppler, color Doppler, and power Doppler as needed of all accessible portions of each vessel. Bilateral testing is considered an integral part of a complete examination. Limited examinations for reoccurring indications may be performed as noted. The reflux portion of the exam is performed with the patient in reverse Trendelenburg.  +---------+---------------+---------+-----------+----------+--------------+ RIGHT    CompressibilityPhasicitySpontaneityPropertiesThrombus Aging +---------+---------------+---------+-----------+----------+--------------+ CFV      Full  lobe may reflect atelectasis versus consolidation. IMPRESSION: 1. Orogastric tube tip and side hole project over the expected location of the gastric body. Relative paucity of bowel gas in the visualized abdomen. 2. Airspace opacity in the medial left lower lobe may reflect atelectasis versus pneumonia in the appropriate clinical context. Electronically Signed   By: Sherron Ales M.D.   On: 11/14/2021 14:00   ECHOCARDIOGRAM COMPLETE  Result Date: 11/14/2021    ECHOCARDIOGRAM REPORT   Patient Name:   Kara [name] Date of Exam: 11/14/2021  Medical Rec #:  [id number]     Height:       67.0 in Accession #:    [id number]    Weight:       200.4 lb Date of Birth:  [birth year]      BSA:          2.024 m Patient Age:    36 years      BP:           101/78 mmHg Patient Gender: F             HR:           98 bpm. Exam Location:  Inpatient Procedure: 2D Echo Indications:    cardiac arrest  History:        Patient has no prior history of Echocardiogram examinations.  Sonographer:    Delcie Roch RDCS Referring Phys: [phone] HAO MENG IMPRESSIONS  1. Left ventricular ejection fraction, by estimation, is 45 to 50%. The left ventricle has mildly decreased function. The left ventricle demonstrates global hypokinesis. Indeterminate diastolic filling due to E-A fusion. Tissue Doppler velocities suggest normal myocardial relaxation.  2. Right ventricular systolic function is normal. The right ventricular size is normal. There is normal pulmonary artery systolic pressure. The estimated right ventricular systolic pressure is 19.3 mmHg.  3. The mitral valve is normal in structure. No evidence of mitral valve regurgitation. No evidence of mitral stenosis.  4. The aortic valve is normal in structure. Aortic valve regurgitation is not visualized. No aortic stenosis is present.  5. The inferior vena cava is normal in size with greater than 50% respiratory variability, suggesting right atrial pressure of 3 mmHg. FINDINGS  Left Ventricle: Left ventricular ejection fraction, by estimation, is 45 to 50%. The left ventricle has mildly decreased function. The left ventricle demonstrates global hypokinesis. The left ventricular internal cavity size was normal in size. There is  no left ventricular hypertrophy. Indeterminate diastolic filling due to E-A fusion. Right Ventricle: The right ventricular size is normal. No increase in right ventricular wall thickness. Right ventricular systolic function is normal. There is normal pulmonary artery systolic pressure. The tricuspid  regurgitant velocity is 2.02 m/s, and  with an assumed right atrial pressure of 3 mmHg, the estimated right ventricular systolic pressure is 19.3 mmHg. Left Atrium: Left atrial size was normal in size. Right Atrium: Right atrial size was normal in size. Pericardium: There is no evidence of pericardial effusion. Mitral Valve: The mitral valve is normal in structure. No evidence of mitral valve regurgitation. No evidence of mitral valve stenosis. Tricuspid Valve: The tricuspid valve is normal in structure. Tricuspid valve regurgitation is trivial. No evidence of tricuspid stenosis. Aortic Valve: Normal right and left coronary ostia location. The aortic valve is normal in structure. Aortic valve regurgitation is not visualized. No aortic stenosis is present. Pulmonic Valve: The pulmonic valve was grossly normal. Pulmonic valve regurgitation is not visualized. No evidence of pulmonic stenosis. Aorta: The aortic  21*  GLUCOSE 290*   < >  --   --  140* 144* 129*  BUN 6   < >  --   --  9 12 17   CREATININE 1.06*   < >  --   --  1.53* 1.90* 2.66*  CALCIUM 8.2*  --   --   --  8.1* 8.0* 8.2*  MG  --   --  1.5*  --  2.1  --  1.9  PROT 6.4*  --   --   --  6.3*  --  5.9*  ALBUMIN 2.8*  --   --   --  2.8*  --  2.4*  AST 178*  --   --   --  148*  --  87*  ALT 43  --   --   --  45*  --  36  ALKPHOS 321*  --   --   --  265*  --  220*  BILITOT 2.6*  --   --   --  2.2*  --  2.3*  GFRNONAA >60  --   --   --  45* 34* 23*  ANIONGAP 18*  --   --   --  13 9 8    < > = values in this interval not displayed.    Lipids  Recent Labs  Lab 11/14/21 0418  TRIG 180*    Hematology Recent Labs  Lab 11/13/21 1919 11/13/21 1920 11/13/21 2254 11/14/21 0418 11/15/21 0623  WBC 23.9*  --   --  21.3* 8.5  RBC 4.07  --   --  4.08 3.61*  HGB 13.0   < > 13.6 13.1 11.5*  HCT 41.0   < > 40.0 41.8 36.8  MCV 100.7*  --   --  102.5* 101.9*  MCH 31.9  --   --  32.1 31.9  MCHC 31.7  --   --  31.3  31.3  RDW 15.3  --   --  15.5 15.3  PLT 330  --   --  180 88*   < > = values in this interval not displayed.   Thyroid  Recent Labs  Lab 11/13/21 2139  TSH 11.491*  FREET4 1.24*    BNP Recent Labs  Lab 11/13/21 1930  BNP 252.7*    DDimer No results for input(s): "DDIMER" in the last 168 hours.   Radiology    VAS [place] LOWER EXTREMITY VENOUS (DVT)  Result Date: 11/14/2021  Lower Venous DVT Study Patient Name:  Kara [name]  Date of Exam:   11/14/2021 Medical Rec #: [id number]      Accession #:    [id number] Date of Birth: Nov 22, 1985       Patient Gender: F Patient Age:   36 years Exam Location:  [facility] Procedure:      VAS [place] LOWER EXTREMITY VENOUS (DVT) Referring Phys: Jenny Reichmann PAYNE --------------------------------------------------------------------------------  Indications: Edema. Other Indications: Status post cardiac arrest. Comparison Study: No prior study on file Performing Technologist: Sharion Dove RVS  Examination Guidelines: A complete evaluation includes B-mode imaging, spectral Doppler, color Doppler, and power Doppler as needed of all accessible portions of each vessel. Bilateral testing is considered an integral part of a complete examination. Limited examinations for reoccurring indications may be performed as noted. The reflux portion of the exam is performed with the patient in reverse Trendelenburg.  +---------+---------------+---------+-----------+----------+--------------+ RIGHT    CompressibilityPhasicitySpontaneityPropertiesThrombus Aging +---------+---------------+---------+-----------+----------+--------------+ CFV      Full  lobe may reflect atelectasis versus consolidation. IMPRESSION: 1. Orogastric tube tip and side hole project over the expected location of the gastric body. Relative paucity of bowel gas in the visualized abdomen. 2. Airspace opacity in the medial left lower lobe may reflect atelectasis versus pneumonia in the appropriate clinical context. Electronically Signed   By: Sherron Ales M.D.   On: 11/14/2021 14:00   ECHOCARDIOGRAM COMPLETE  Result Date: 11/14/2021    ECHOCARDIOGRAM REPORT   Patient Name:   Kara [name] Date of Exam: 11/14/2021  Medical Rec #:  [id number]     Height:       67.0 in Accession #:    [id number]    Weight:       200.4 lb Date of Birth:  [birth year]      BSA:          2.024 m Patient Age:    36 years      BP:           101/78 mmHg Patient Gender: F             HR:           98 bpm. Exam Location:  Inpatient Procedure: 2D Echo Indications:    cardiac arrest  History:        Patient has no prior history of Echocardiogram examinations.  Sonographer:    Delcie Roch RDCS Referring Phys: [phone] HAO MENG IMPRESSIONS  1. Left ventricular ejection fraction, by estimation, is 45 to 50%. The left ventricle has mildly decreased function. The left ventricle demonstrates global hypokinesis. Indeterminate diastolic filling due to E-A fusion. Tissue Doppler velocities suggest normal myocardial relaxation.  2. Right ventricular systolic function is normal. The right ventricular size is normal. There is normal pulmonary artery systolic pressure. The estimated right ventricular systolic pressure is 19.3 mmHg.  3. The mitral valve is normal in structure. No evidence of mitral valve regurgitation. No evidence of mitral stenosis.  4. The aortic valve is normal in structure. Aortic valve regurgitation is not visualized. No aortic stenosis is present.  5. The inferior vena cava is normal in size with greater than 50% respiratory variability, suggesting right atrial pressure of 3 mmHg. FINDINGS  Left Ventricle: Left ventricular ejection fraction, by estimation, is 45 to 50%. The left ventricle has mildly decreased function. The left ventricle demonstrates global hypokinesis. The left ventricular internal cavity size was normal in size. There is  no left ventricular hypertrophy. Indeterminate diastolic filling due to E-A fusion. Right Ventricle: The right ventricular size is normal. No increase in right ventricular wall thickness. Right ventricular systolic function is normal. There is normal pulmonary artery systolic pressure. The tricuspid  regurgitant velocity is 2.02 m/s, and  with an assumed right atrial pressure of 3 mmHg, the estimated right ventricular systolic pressure is 19.3 mmHg. Left Atrium: Left atrial size was normal in size. Right Atrium: Right atrial size was normal in size. Pericardium: There is no evidence of pericardial effusion. Mitral Valve: The mitral valve is normal in structure. No evidence of mitral valve regurgitation. No evidence of mitral valve stenosis. Tricuspid Valve: The tricuspid valve is normal in structure. Tricuspid valve regurgitation is trivial. No evidence of tricuspid stenosis. Aortic Valve: Normal right and left coronary ostia location. The aortic valve is normal in structure. Aortic valve regurgitation is not visualized. No aortic stenosis is present. Pulmonic Valve: The pulmonic valve was grossly normal. Pulmonic valve regurgitation is not visualized. No evidence of pulmonic stenosis. Aorta: The aortic  lobe may reflect atelectasis versus consolidation. IMPRESSION: 1. Orogastric tube tip and side hole project over the expected location of the gastric body. Relative paucity of bowel gas in the visualized abdomen. 2. Airspace opacity in the medial left lower lobe may reflect atelectasis versus pneumonia in the appropriate clinical context. Electronically Signed   By: Sherron Ales M.D.   On: 11/14/2021 14:00   ECHOCARDIOGRAM COMPLETE  Result Date: 11/14/2021    ECHOCARDIOGRAM REPORT   Patient Name:   Kara [name] Date of Exam: 11/14/2021  Medical Rec #:  [id number]     Height:       67.0 in Accession #:    [id number]    Weight:       200.4 lb Date of Birth:  [birth year]      BSA:          2.024 m Patient Age:    36 years      BP:           101/78 mmHg Patient Gender: F             HR:           98 bpm. Exam Location:  Inpatient Procedure: 2D Echo Indications:    cardiac arrest  History:        Patient has no prior history of Echocardiogram examinations.  Sonographer:    Delcie Roch RDCS Referring Phys: [phone] HAO MENG IMPRESSIONS  1. Left ventricular ejection fraction, by estimation, is 45 to 50%. The left ventricle has mildly decreased function. The left ventricle demonstrates global hypokinesis. Indeterminate diastolic filling due to E-A fusion. Tissue Doppler velocities suggest normal myocardial relaxation.  2. Right ventricular systolic function is normal. The right ventricular size is normal. There is normal pulmonary artery systolic pressure. The estimated right ventricular systolic pressure is 19.3 mmHg.  3. The mitral valve is normal in structure. No evidence of mitral valve regurgitation. No evidence of mitral stenosis.  4. The aortic valve is normal in structure. Aortic valve regurgitation is not visualized. No aortic stenosis is present.  5. The inferior vena cava is normal in size with greater than 50% respiratory variability, suggesting right atrial pressure of 3 mmHg. FINDINGS  Left Ventricle: Left ventricular ejection fraction, by estimation, is 45 to 50%. The left ventricle has mildly decreased function. The left ventricle demonstrates global hypokinesis. The left ventricular internal cavity size was normal in size. There is  no left ventricular hypertrophy. Indeterminate diastolic filling due to E-A fusion. Right Ventricle: The right ventricular size is normal. No increase in right ventricular wall thickness. Right ventricular systolic function is normal. There is normal pulmonary artery systolic pressure. The tricuspid  regurgitant velocity is 2.02 m/s, and  with an assumed right atrial pressure of 3 mmHg, the estimated right ventricular systolic pressure is 19.3 mmHg. Left Atrium: Left atrial size was normal in size. Right Atrium: Right atrial size was normal in size. Pericardium: There is no evidence of pericardial effusion. Mitral Valve: The mitral valve is normal in structure. No evidence of mitral valve regurgitation. No evidence of mitral valve stenosis. Tricuspid Valve: The tricuspid valve is normal in structure. Tricuspid valve regurgitation is trivial. No evidence of tricuspid stenosis. Aortic Valve: Normal right and left coronary ostia location. The aortic valve is normal in structure. Aortic valve regurgitation is not visualized. No aortic stenosis is present. Pulmonic Valve: The pulmonic valve was grossly normal. Pulmonic valve regurgitation is not visualized. No evidence of pulmonic stenosis. Aorta: The aortic  lobe may reflect atelectasis versus consolidation. IMPRESSION: 1. Orogastric tube tip and side hole project over the expected location of the gastric body. Relative paucity of bowel gas in the visualized abdomen. 2. Airspace opacity in the medial left lower lobe may reflect atelectasis versus pneumonia in the appropriate clinical context. Electronically Signed   By: Sherron Ales M.D.   On: 11/14/2021 14:00   ECHOCARDIOGRAM COMPLETE  Result Date: 11/14/2021    ECHOCARDIOGRAM REPORT   Patient Name:   Kara [name] Date of Exam: 11/14/2021  Medical Rec #:  [id number]     Height:       67.0 in Accession #:    [id number]    Weight:       200.4 lb Date of Birth:  [birth year]      BSA:          2.024 m Patient Age:    36 years      BP:           101/78 mmHg Patient Gender: F             HR:           98 bpm. Exam Location:  Inpatient Procedure: 2D Echo Indications:    cardiac arrest  History:        Patient has no prior history of Echocardiogram examinations.  Sonographer:    Delcie Roch RDCS Referring Phys: [phone] HAO MENG IMPRESSIONS  1. Left ventricular ejection fraction, by estimation, is 45 to 50%. The left ventricle has mildly decreased function. The left ventricle demonstrates global hypokinesis. Indeterminate diastolic filling due to E-A fusion. Tissue Doppler velocities suggest normal myocardial relaxation.  2. Right ventricular systolic function is normal. The right ventricular size is normal. There is normal pulmonary artery systolic pressure. The estimated right ventricular systolic pressure is 19.3 mmHg.  3. The mitral valve is normal in structure. No evidence of mitral valve regurgitation. No evidence of mitral stenosis.  4. The aortic valve is normal in structure. Aortic valve regurgitation is not visualized. No aortic stenosis is present.  5. The inferior vena cava is normal in size with greater than 50% respiratory variability, suggesting right atrial pressure of 3 mmHg. FINDINGS  Left Ventricle: Left ventricular ejection fraction, by estimation, is 45 to 50%. The left ventricle has mildly decreased function. The left ventricle demonstrates global hypokinesis. The left ventricular internal cavity size was normal in size. There is  no left ventricular hypertrophy. Indeterminate diastolic filling due to E-A fusion. Right Ventricle: The right ventricular size is normal. No increase in right ventricular wall thickness. Right ventricular systolic function is normal. There is normal pulmonary artery systolic pressure. The tricuspid  regurgitant velocity is 2.02 m/s, and  with an assumed right atrial pressure of 3 mmHg, the estimated right ventricular systolic pressure is 19.3 mmHg. Left Atrium: Left atrial size was normal in size. Right Atrium: Right atrial size was normal in size. Pericardium: There is no evidence of pericardial effusion. Mitral Valve: The mitral valve is normal in structure. No evidence of mitral valve regurgitation. No evidence of mitral valve stenosis. Tricuspid Valve: The tricuspid valve is normal in structure. Tricuspid valve regurgitation is trivial. No evidence of tricuspid stenosis. Aortic Valve: Normal right and left coronary ostia location. The aortic valve is normal in structure. Aortic valve regurgitation is not visualized. No aortic stenosis is present. Pulmonic Valve: The pulmonic valve was grossly normal. Pulmonic valve regurgitation is not visualized. No evidence of pulmonic stenosis. Aorta: The aortic  21*  GLUCOSE 290*   < >  --   --  140* 144* 129*  BUN 6   < >  --   --  9 12 17   CREATININE 1.06*   < >  --   --  1.53* 1.90* 2.66*  CALCIUM 8.2*  --   --   --  8.1* 8.0* 8.2*  MG  --   --  1.5*  --  2.1  --  1.9  PROT 6.4*  --   --   --  6.3*  --  5.9*  ALBUMIN 2.8*  --   --   --  2.8*  --  2.4*  AST 178*  --   --   --  148*  --  87*  ALT 43  --   --   --  45*  --  36  ALKPHOS 321*  --   --   --  265*  --  220*  BILITOT 2.6*  --   --   --  2.2*  --  2.3*  GFRNONAA >60  --   --   --  45* 34* 23*  ANIONGAP 18*  --   --   --  13 9 8    < > = values in this interval not displayed.    Lipids  Recent Labs  Lab 11/14/21 0418  TRIG 180*    Hematology Recent Labs  Lab 11/13/21 1919 11/13/21 1920 11/13/21 2254 11/14/21 0418 11/15/21 0623  WBC 23.9*  --   --  21.3* 8.5  RBC 4.07  --   --  4.08 3.61*  HGB 13.0   < > 13.6 13.1 11.5*  HCT 41.0   < > 40.0 41.8 36.8  MCV 100.7*  --   --  102.5* 101.9*  MCH 31.9  --   --  32.1 31.9  MCHC 31.7  --   --  31.3  31.3  RDW 15.3  --   --  15.5 15.3  PLT 330  --   --  180 88*   < > = values in this interval not displayed.   Thyroid  Recent Labs  Lab 11/13/21 2139  TSH 11.491*  FREET4 1.24*    BNP Recent Labs  Lab 11/13/21 1930  BNP 252.7*    DDimer No results for input(s): "DDIMER" in the last 168 hours.   Radiology    VAS [place] LOWER EXTREMITY VENOUS (DVT)  Result Date: 11/14/2021  Lower Venous DVT Study Patient Name:  Kara [name]  Date of Exam:   11/14/2021 Medical Rec #: [id number]      Accession #:    [id number] Date of Birth: Nov 22, 1985       Patient Gender: F Patient Age:   36 years Exam Location:  [facility] Procedure:      VAS [place] LOWER EXTREMITY VENOUS (DVT) Referring Phys: Jenny Reichmann PAYNE --------------------------------------------------------------------------------  Indications: Edema. Other Indications: Status post cardiac arrest. Comparison Study: No prior study on file Performing Technologist: Sharion Dove RVS  Examination Guidelines: A complete evaluation includes B-mode imaging, spectral Doppler, color Doppler, and power Doppler as needed of all accessible portions of each vessel. Bilateral testing is considered an integral part of a complete examination. Limited examinations for reoccurring indications may be performed as noted. The reflux portion of the exam is performed with the patient in reverse Trendelenburg.  +---------+---------------+---------+-----------+----------+--------------+ RIGHT    CompressibilityPhasicitySpontaneityPropertiesThrombus Aging +---------+---------------+---------+-----------+----------+--------------+ CFV      Full  lobe may reflect atelectasis versus consolidation. IMPRESSION: 1. Orogastric tube tip and side hole project over the expected location of the gastric body. Relative paucity of bowel gas in the visualized abdomen. 2. Airspace opacity in the medial left lower lobe may reflect atelectasis versus pneumonia in the appropriate clinical context. Electronically Signed   By: Sherron Ales M.D.   On: 11/14/2021 14:00   ECHOCARDIOGRAM COMPLETE  Result Date: 11/14/2021    ECHOCARDIOGRAM REPORT   Patient Name:   Kara [name] Date of Exam: 11/14/2021  Medical Rec #:  [id number]     Height:       67.0 in Accession #:    [id number]    Weight:       200.4 lb Date of Birth:  [birth year]      BSA:          2.024 m Patient Age:    36 years      BP:           101/78 mmHg Patient Gender: F             HR:           98 bpm. Exam Location:  Inpatient Procedure: 2D Echo Indications:    cardiac arrest  History:        Patient has no prior history of Echocardiogram examinations.  Sonographer:    Delcie Roch RDCS Referring Phys: [phone] HAO MENG IMPRESSIONS  1. Left ventricular ejection fraction, by estimation, is 45 to 50%. The left ventricle has mildly decreased function. The left ventricle demonstrates global hypokinesis. Indeterminate diastolic filling due to E-A fusion. Tissue Doppler velocities suggest normal myocardial relaxation.  2. Right ventricular systolic function is normal. The right ventricular size is normal. There is normal pulmonary artery systolic pressure. The estimated right ventricular systolic pressure is 19.3 mmHg.  3. The mitral valve is normal in structure. No evidence of mitral valve regurgitation. No evidence of mitral stenosis.  4. The aortic valve is normal in structure. Aortic valve regurgitation is not visualized. No aortic stenosis is present.  5. The inferior vena cava is normal in size with greater than 50% respiratory variability, suggesting right atrial pressure of 3 mmHg. FINDINGS  Left Ventricle: Left ventricular ejection fraction, by estimation, is 45 to 50%. The left ventricle has mildly decreased function. The left ventricle demonstrates global hypokinesis. The left ventricular internal cavity size was normal in size. There is  no left ventricular hypertrophy. Indeterminate diastolic filling due to E-A fusion. Right Ventricle: The right ventricular size is normal. No increase in right ventricular wall thickness. Right ventricular systolic function is normal. There is normal pulmonary artery systolic pressure. The tricuspid  regurgitant velocity is 2.02 m/s, and  with an assumed right atrial pressure of 3 mmHg, the estimated right ventricular systolic pressure is 19.3 mmHg. Left Atrium: Left atrial size was normal in size. Right Atrium: Right atrial size was normal in size. Pericardium: There is no evidence of pericardial effusion. Mitral Valve: The mitral valve is normal in structure. No evidence of mitral valve regurgitation. No evidence of mitral valve stenosis. Tricuspid Valve: The tricuspid valve is normal in structure. Tricuspid valve regurgitation is trivial. No evidence of tricuspid stenosis. Aortic Valve: Normal right and left coronary ostia location. The aortic valve is normal in structure. Aortic valve regurgitation is not visualized. No aortic stenosis is present. Pulmonic Valve: The pulmonic valve was grossly normal. Pulmonic valve regurgitation is not visualized. No evidence of pulmonic stenosis. Aorta: The aortic  lobe may reflect atelectasis versus consolidation. IMPRESSION: 1. Orogastric tube tip and side hole project over the expected location of the gastric body. Relative paucity of bowel gas in the visualized abdomen. 2. Airspace opacity in the medial left lower lobe may reflect atelectasis versus pneumonia in the appropriate clinical context. Electronically Signed   By: Sherron Ales M.D.   On: 11/14/2021 14:00   ECHOCARDIOGRAM COMPLETE  Result Date: 11/14/2021    ECHOCARDIOGRAM REPORT   Patient Name:   Kara [name] Date of Exam: 11/14/2021  Medical Rec #:  [id number]     Height:       67.0 in Accession #:    [id number]    Weight:       200.4 lb Date of Birth:  [birth year]      BSA:          2.024 m Patient Age:    36 years      BP:           101/78 mmHg Patient Gender: F             HR:           98 bpm. Exam Location:  Inpatient Procedure: 2D Echo Indications:    cardiac arrest  History:        Patient has no prior history of Echocardiogram examinations.  Sonographer:    Delcie Roch RDCS Referring Phys: [phone] HAO MENG IMPRESSIONS  1. Left ventricular ejection fraction, by estimation, is 45 to 50%. The left ventricle has mildly decreased function. The left ventricle demonstrates global hypokinesis. Indeterminate diastolic filling due to E-A fusion. Tissue Doppler velocities suggest normal myocardial relaxation.  2. Right ventricular systolic function is normal. The right ventricular size is normal. There is normal pulmonary artery systolic pressure. The estimated right ventricular systolic pressure is 19.3 mmHg.  3. The mitral valve is normal in structure. No evidence of mitral valve regurgitation. No evidence of mitral stenosis.  4. The aortic valve is normal in structure. Aortic valve regurgitation is not visualized. No aortic stenosis is present.  5. The inferior vena cava is normal in size with greater than 50% respiratory variability, suggesting right atrial pressure of 3 mmHg. FINDINGS  Left Ventricle: Left ventricular ejection fraction, by estimation, is 45 to 50%. The left ventricle has mildly decreased function. The left ventricle demonstrates global hypokinesis. The left ventricular internal cavity size was normal in size. There is  no left ventricular hypertrophy. Indeterminate diastolic filling due to E-A fusion. Right Ventricle: The right ventricular size is normal. No increase in right ventricular wall thickness. Right ventricular systolic function is normal. There is normal pulmonary artery systolic pressure. The tricuspid  regurgitant velocity is 2.02 m/s, and  with an assumed right atrial pressure of 3 mmHg, the estimated right ventricular systolic pressure is 19.3 mmHg. Left Atrium: Left atrial size was normal in size. Right Atrium: Right atrial size was normal in size. Pericardium: There is no evidence of pericardial effusion. Mitral Valve: The mitral valve is normal in structure. No evidence of mitral valve regurgitation. No evidence of mitral valve stenosis. Tricuspid Valve: The tricuspid valve is normal in structure. Tricuspid valve regurgitation is trivial. No evidence of tricuspid stenosis. Aortic Valve: Normal right and left coronary ostia location. The aortic valve is normal in structure. Aortic valve regurgitation is not visualized. No aortic stenosis is present. Pulmonic Valve: The pulmonic valve was grossly normal. Pulmonic valve regurgitation is not visualized. No evidence of pulmonic stenosis. Aorta: The aortic

## 2021-11-15 NOTE — Progress Notes (Addendum)
Opened her eyes spontaneously, exhibiting upward gaze and roving eye movements but does attempt to focus when spoken to. Nods or shakes head appropriately to questions. Trying to sit up and pulls arms toward chest. VSS. UOP improving with MAP above 70. Reoriented and reassurance given to both patient and family.

## 2021-11-15 NOTE — Progress Notes (Signed)
Subjective: Per nursing, was answering questions with nods/shakes overnight, but had to be resedated for TTM  Spoke with mother, last seizure was in December.  Exam: Vitals:   11/15/21 1000 11/15/21 1006  BP: 106/72   Pulse: 100 90  Resp: 12 (!) 24  Temp: 98.2 F (36.8 C) 98.2 F (36.8 C)  SpO2: 98% 97%   Gen: In bed, NAD Resp: non-labored breathing, no acute distress Abd: soft, nt  Neuro: MS: Sedated, plan to lighten sedation tomorrow CN: Pupils reactive bilaterally, doll's eye intact, corneals intact Motor: Withdraws x4 Sensory: As above   Pertinent Labs: Creatinine 2.66  Impression: 36 year old female with a history of seizures who presents with cardiac arrest, presumably due to prolonged QTc.  There was a question of a single seizure, but that was in the setting of missed doses.  She is now back on her home dose of Topamax, and a renal dose of gabapentin.  I will ask pharmacy to continue assistance with dosing of gabapentin as she fluctuates.  Recommendations: 1) continue Topamax 100 mg twice daily 2) continue gabapentin(equivalent to 900 mg twice daily with normal renal function, pharmacy consult to adjust) 3) continue LTM EEG through tomorrow when sedation can be weaned.  Ritta Slot, MD [facility] [phone]  If 7pm- 7am, please page neurology on call as listed in AMION.

## 2021-11-15 NOTE — Procedures (Signed)
Patient Name: Kara [name]  MRN: [id number]  Epilepsy Attending: Charlsie Quest  Referring Physician/Provider: Rejeana Brock, MD  Duration: 11/14/2021 7357 to 11/15/2021 8978   Patient history: 36yo F s/p cardiac arrest. EEG to evaluate for seizure.   Level of alertness: awake, asleep   AEDs during EEG study: Propofol, TPM, GBP, VPA   Technical aspects: This EEG study was done with scalp electrodes positioned according to the 10-20 International system of electrode placement. Electrical activity was reviewed with band pass filter of 1-70Hz , sensitivity of 7 uV/mm, display speed of 65mm/sec with a 60Hz  notched filter applied as appropriate. EEG data were recorded continuously and digitally stored.  Video monitoring was available and reviewed as appropriate.   Description: During awake state, no clear posterior dominant rhythm was seen. EEG showed continuous generalized 3-5Hz  theta-delta slowing. Sleep was characterized by vertex waves, sleep spindles (12-14hz ), maximal fronto-central region. Hyperventilation and photic stimulation were not performed.      ABNORMALITY - Continuous slow, generalized   IMPRESSION: This study is suggestive of moderate to severe diffuse encephalopathy, nonspecific etiology. No seizures or epileptiform discharges were seen throughout the recording.   [name] 

## 2021-11-15 NOTE — Progress Notes (Addendum)
Called Elink  pt continues to breath stack and have desynchronous breathing on vent x 2 hours. Maxed on current sedation, Prop 50 mcg and Fentanyl 200 mcg. Administered versed 2 mg at 2219 and 2247. Repositioned pt in bed. Decompressed stomach, pt had 250 mL of tube feed. Respiratory at bedside patient continued to be desynchronous on different vent settings. No seizure activity seen, pupils are 2 equal midline and sluggish. Vital signs stable. Waiting for orders.  Oliver Barre, RN

## 2021-11-16 ENCOUNTER — Inpatient Hospital Stay (HOSPITAL_COMMUNITY): Payer: BC Managed Care – PPO

## 2021-11-16 DIAGNOSIS — J9601 Acute respiratory failure with hypoxia: Secondary | ICD-10-CM

## 2021-11-16 DIAGNOSIS — E872 Acidosis, unspecified: Secondary | ICD-10-CM | POA: Diagnosis not present

## 2021-11-16 DIAGNOSIS — E876 Hypokalemia: Secondary | ICD-10-CM | POA: Diagnosis not present

## 2021-11-16 DIAGNOSIS — I469 Cardiac arrest, cause unspecified: Secondary | ICD-10-CM | POA: Diagnosis not present

## 2021-11-16 DIAGNOSIS — G9340 Encephalopathy, unspecified: Secondary | ICD-10-CM | POA: Diagnosis not present

## 2021-11-16 LAB — HEPATIC FUNCTION PANEL
ALT: 36 U/L (ref 0–44)
AST: 98 U/L — ABNORMAL HIGH (ref 15–41)
Albumin: 2.4 g/dL — ABNORMAL LOW (ref 3.5–5.0)
Alkaline Phosphatase: 258 U/L — ABNORMAL HIGH (ref 38–126)
Bilirubin, Direct: 1.8 mg/dL — ABNORMAL HIGH (ref 0.0–0.2)
Indirect Bilirubin: 1.6 mg/dL — ABNORMAL HIGH (ref 0.3–0.9)
Total Bilirubin: 3.4 mg/dL — ABNORMAL HIGH (ref 0.3–1.2)
Total Protein: 6.1 g/dL — ABNORMAL LOW (ref 6.5–8.1)

## 2021-11-16 LAB — BASIC METABOLIC PANEL
Anion gap: 10 (ref 5–15)
BUN: 21 mg/dL — ABNORMAL HIGH (ref 6–20)
CO2: 17 mmol/L — ABNORMAL LOW (ref 22–32)
Calcium: 8.6 mg/dL — ABNORMAL LOW (ref 8.9–10.3)
Chloride: 108 mmol/L (ref 98–111)
Creatinine, Ser: 3.36 mg/dL — ABNORMAL HIGH (ref 0.44–1.00)
GFR, Estimated: 17 mL/min — ABNORMAL LOW (ref >60)
Glucose, Bld: 117 mg/dL — ABNORMAL HIGH (ref 70–99)
Potassium: 3.7 mmol/L (ref 3.5–5.1)
Sodium: 135 mmol/L (ref 135–145)

## 2021-11-16 LAB — GLUCOSE, CAPILLARY
Glucose-Capillary: 122 mg/dL — ABNORMAL HIGH (ref 70–99)
Glucose-Capillary: 142 mg/dL — ABNORMAL HIGH (ref 70–99)
Glucose-Capillary: 166 mg/dL — ABNORMAL HIGH (ref 70–99)
Glucose-Capillary: 166 mg/dL — ABNORMAL HIGH (ref 70–99)
Glucose-Capillary: 196 mg/dL — ABNORMAL HIGH (ref 70–99)
Glucose-Capillary: 121 mg/dL — ABNORMAL HIGH (ref 70–99)
Glucose-Capillary: 125 mg/dL — ABNORMAL HIGH (ref 70–99)

## 2021-11-16 LAB — POCT I-STAT 7, (LYTES, BLD GAS, ICA,H+H)
Acid-base deficit: 8 mmol/L — ABNORMAL HIGH (ref 0.0–2.0)
Acid-base deficit: 8 mmol/L — ABNORMAL HIGH (ref 0.0–2.0)
Bicarbonate: 18.3 mmol/L — ABNORMAL LOW (ref 20.0–28.0)
Bicarbonate: 18.2 mmol/L — ABNORMAL LOW (ref 20.0–28.0)
Calcium, Ion: 1.28 mmol/L (ref 1.15–1.40)
Calcium, Ion: 1.31 mmol/L (ref 1.15–1.40)
HCT: 35 % — ABNORMAL LOW (ref 36.0–46.0)
HCT: 32 % — ABNORMAL LOW (ref 36.0–46.0)
Hemoglobin: 11.9 g/dL — ABNORMAL LOW (ref 12.0–15.0)
Hemoglobin: 10.9 g/dL — ABNORMAL LOW (ref 12.0–15.0)
O2 Saturation: 98 %
O2 Saturation: 99 %
Patient temperature: 37.4
Patient temperature: 36.7
Potassium: 3.7 mmol/L (ref 3.5–5.1)
Potassium: 4.8 mmol/L (ref 3.5–5.1)
Sodium: 137 mmol/L (ref 135–145)
Sodium: 135 mmol/L (ref 135–145)
TCO2: 20 mmol/L — ABNORMAL LOW (ref 22–32)
TCO2: 19 mmol/L — ABNORMAL LOW (ref 22–32)
pCO2 arterial: 41.9 mmHg (ref 32–48)
pCO2 arterial: 38.7 mmHg (ref 32–48)
pH, Arterial: 7.251 — ABNORMAL LOW (ref 7.35–7.45)
pH, Arterial: 7.278 — ABNORMAL LOW (ref 7.35–7.45)
pO2, Arterial: 131 mmHg — ABNORMAL HIGH (ref 83–108)
pO2, Arterial: 138 mmHg — ABNORMAL HIGH (ref 83–108)

## 2021-11-16 LAB — CBC
HCT: 36.5 % (ref 36.0–46.0)
Hemoglobin: 11.2 g/dL — ABNORMAL LOW (ref 12.0–15.0)
MCH: 31.7 pg (ref 26.0–34.0)
MCHC: 30.7 g/dL (ref 30.0–36.0)
MCV: 103.4 fL — ABNORMAL HIGH (ref 80.0–100.0)
Platelets: 127 10*3/uL — ABNORMAL LOW (ref 150–400)
RBC: 3.53 MIL/uL — ABNORMAL LOW (ref 3.87–5.11)
RDW: 15.4 % (ref 11.5–15.5)
WBC: 11.5 10*3/uL — ABNORMAL HIGH (ref 4.0–10.5)
nRBC: 0 % (ref 0.0–0.2)

## 2021-11-16 LAB — LACTIC ACID, PLASMA
Lactic Acid, Venous: 1.2 mmol/L (ref 0.5–1.9)
Lactic Acid, Venous: 1.1 mmol/L (ref 0.5–1.9)

## 2021-11-16 LAB — CK: Total CK: 62 U/L (ref 38–234)

## 2021-11-16 LAB — MAGNESIUM: Magnesium: 2.1 mg/dL (ref 1.7–2.4)

## 2021-11-16 LAB — PHOSPHORUS: Phosphorus: 2.9 mg/dL (ref 2.5–4.6)

## 2021-11-16 MED ORDER — PNEUMOCOCCAL 20-VAL CONJ VACC 0.5 ML IM SUSY: 0.5000 mL | PREFILLED_SYRINGE | INTRAMUSCULAR | Status: DC | PRN

## 2021-11-16 MED ORDER — LACTATED RINGERS IV SOLN: INTRAVENOUS | Status: DC

## 2021-11-16 MED ORDER — POTASSIUM CHLORIDE 20 MEQ PO PACK
40.0000 meq | PACK | Freq: Once | ORAL | Status: AC
  Administered 2021-11-16: 40 meq via ORAL
  Filled 2021-11-16: qty 2

## 2021-11-16 MED ORDER — HEPARIN SODIUM (PORCINE) 5000 UNIT/ML IJ SOLN
5000.0000 [IU] | Freq: Three times a day (TID) | INTRAMUSCULAR | Status: DC
  Administered 2021-11-17 – 2021-11-26 (×29): 5000 [IU] via SUBCUTANEOUS
  Filled 2021-11-16 (×30): qty 1

## 2021-11-16 MED ORDER — SODIUM CHLORIDE 0.9 % IV SOLN
2.0000 g | INTRAVENOUS | Status: AC
  Administered 2021-11-16 – 2021-11-17 (×2): 2 g via INTRAVENOUS
  Filled 2021-11-16 (×2): qty 20

## 2021-11-16 NOTE — Progress Notes (Addendum)
Continued dyssynchronous breathing, but not as often. Continue current regimen per MD. Returned 250 mL of tube feed to patient and patient developed an increase in dyssynchronous breathing. Turned tube feed off and notified Elink. Oliver Barre, RN

## 2021-11-16 NOTE — Progress Notes (Signed)
Neurology Progress Note  Subjective: Per nursing, concern for seizure-like activity after 07:00 this morning with sedation paused with upward gaze. No seizure identified on EEG.  Family updated at bedside. CCM present during assessment   Exam: Vitals:   11/16/21 1200 11/16/21 1243  BP: 122/85   Pulse: (!) 107   Resp: (!) 24   Temp: 98.2 F (36.8 C)   SpO2: 96% 96%   Gen: Intubated on sedation in the ICU. On propofol and Fentanyl gtts. Propofol paused during assessment.  Resp: Oral ETT in place, right mouth. Cough initiated with inline suctioning. Moderate amount of clear oral secretions present with suctioning.  Abd: soft, non-tender, non-distended  Neuro: MS: Sedated on propofol @ 10 mcg/kg/min, paused for exam. Spontaneously opens eyes with sedation paused, nods appropriately. Follows commands.  CN: Pupils reactive bilaterally/sluggish, corneals intact, cough and gag reflexes intact. Will track examiner in the left visual field, does not look fully rightward. Blinks to threat throughout.  Head appears midline.  Motor: Withdraws bilateral lower extremities. No movement of bilateral upper extremities but she does grimace with application of noxious stimuli. Patient wiggles toes on bilateral lower extremities with minimal withdraw of BLE with noxious stimuli.  Sensory: As above   Pertinent Labs: Creatinine 2.66 Lab Results  Component Value Date   WBC 11.5 (H) 11/16/2021   HGB 10.9 (L) 11/16/2021   HCT 32.0 (L) 11/16/2021   MCV 103.4 (H) 11/16/2021   PLT 127 (L) 11/16/2021   CMP     Component Value Date/Time   NA 135 11/16/2021 1107   K 4.8 11/16/2021 1107   CL 108 11/16/2021 0010   CO2 17 (L) 11/16/2021 0010   GLUCOSE 117 (H) 11/16/2021 0010   BUN 21 (H) 11/16/2021 0010   CREATININE 3.36 (H) 11/16/2021 0010   CALCIUM 8.6 (L) 11/16/2021 0010   PROT 6.1 (L) 11/16/2021 0010   ALBUMIN 2.4 (L) 11/16/2021 0010   AST 98 (H) 11/16/2021 0010   ALT 36 11/16/2021 0010    ALKPHOS 258 (H) 11/16/2021 0010   BILITOT 3.4 (H) 11/16/2021 0010   GFRNONAA 17 (L) 11/16/2021 0010   Impression: 36 year old female with a history of seizures who presented s/p cardiac arrest, presumably due to prolonged QTc. She was resumed on her home dose of Topamax, and a renal dose of gabapentin. There was initial concern for possible missed medication doses but family reported today that she has not missed any medications. Pharmacy on board to continue assistance with dosing of gabapentin as she fluctuates.  Recommendations: 1) continue Topamax 100 mg twice daily 2) continue gabapentin(equivalent to 900 mg twice daily with normal renal function, pharmacy consult to adjust) 3) continue LTM EEG as patient is weaned off of sedation  4) MRI brain wo after EEG d/c'd -- Lanae Boast, AGACNP-BC [facility] [phone]  If 7pm- 7am, please page neurology on call as listed in [facility]  Neurology Attending Attestation   I examined the patient and discussed plan with Ms. Toberman NP. Above note has been edited by me to reflect my findings and recommendations.   This patient is critically ill and at significant risk of neurological worsening, death and care requires constant monitoring of vital signs, hemodynamics,respiratory and cardiac monitoring, neurological assessment, discussion with family, other specialists and medical decision making of high complexity. I spent 45 minutes of neurocritical care time  in the care of  this patient. This was time spent independent of any time provided by nurse practitioner or PA.   [employer]  Selina Cooley, MD [facility] [phone]   If 7pm- 7am, please page neurology on call as listed in AMION.

## 2021-11-16 NOTE — Progress Notes (Signed)
Called to bedside, patient vomited. No tube feeds in secretions when suctioned so doubt aspiration  Will hold TF x 4 hours and have nursing recheck residual then . If < 100 resume TF at 30 cc per hour and increase by 10 cc every 4 hours until goal.  Will check KUB to ensure no illeus Lactic Acid is 1.2 Pt is 9 L positive. She was nauseated and vomiting at home PTA, so suspect she was very dry on admission.  I have asked nirsing to check CBG in 2 hours and if patient is hypoglycemia to call Dr. Merrily Pew to add dextrose to IVF.  CXR is ordered for am.   Kara Ngo, MSN, AGACNP-BC [facility] Pulmonary/Critical Care Medicine See Amion for personal pager PCCM on call pager [phone]  11/16/2021 3:30 PM

## 2021-11-16 NOTE — Progress Notes (Signed)
LTM maint complete - no skin breakdown under Fp1 Fp2 Hair net adjusted Atrium monitored, Event button test confirmed by Atrium.

## 2021-11-16 NOTE — Progress Notes (Signed)
Tube feeding residual rechecked per order. Residual <  100. Tube feed restarted at 30 cc/hr per order. Will continue to monitor.

## 2021-11-16 NOTE — Consult Note (Signed)
WOC Nurse Consult Note: Reason for Consult:right knee; left breast, multiple Wound type: Right knee: surgical; s/p I&D  Left breast; inframmamory; linear; fissure like; 100% clean and pink; 0.1cm x 2.5cm x 0.1cm  Left breast; scabbed at areola; 1cm x 1cm; clean Sternum: scattered partial thickness skin loss; trauma from defibrillator pad; area aprox 4cm x 4cm; pink  Pressure Injury POA: NA Measurement: see above  Wound bed:see above  Drainage (amount, consistency, odor) serous fluid from the right knee wound Periwound: intact  Silver hydrofiber wick to the right knee wound, top with foam. Change every other day Xeroform to the chest/sternum/breast wounds, top with foam, change every other day.   Discussed POC with patient and bedside nurse.  Re consult if needed, will not follow at this time. Thanks  Kimyah Frein [employer], RN,CWOCN, CNS, CWON-AP [phone])

## 2021-11-16 NOTE — Progress Notes (Signed)
87* 98*  --   ALT 45*  --  36 36  --   ALKPHOS 265*  --  220* 258*  --   BILITOT 2.2*  --  2.3* 3.4*  --   GFRNONAA 45* 34* 23* 17*  --   ANIONGAP 13 9 8 10   --      Hematology Recent Labs  Lab 11/14/21 0418 11/15/21 0623 11/16/21 0010 11/16/21 0339  WBC 21.3* 8.5 11.5*  --   RBC 4.08 3.61* 3.53*  --   HGB 13.1 11.5* 11.2* 11.9*  HCT 41.8 36.8 36.5 35.0*  MCV 102.5* 101.9* 103.4*  --   MCH 32.1 31.9 31.7  --   MCHC 31.3 31.3 30.7  --   RDW 15.5 15.3 15.4  --   PLT 180 88* 127*  --     Cardiac EnzymesNo results for input(s): "TROPONINI" in the last 168 hours. No results for input(s): "TROPIPOC" in the last 168 hours.   BNP Recent Labs  Lab 11/13/21 1930  BNP 252.7*     DDimer No results for input(s): "DDIMER" in the last 168 hours.   Radiology    DG CHEST PORT 1 VIEW  Result Date: 11/16/2021 CLINICAL DATA:  Intubation. EXAM: PORTABLE CHEST 1 VIEW COMPARISON:  Chest radiograph dated 11/14/2021. FINDINGS: Endotracheal tube  with tip approximately 1 cm above the carina. Enteric tube extends below the diaphragm with side-port in the proximal stomach and tip beyond the inferior margin of the image. Cardiomegaly with vascular congestion. No focal consolidation, pleural effusion, pneumothorax. No acute osseous pathology. IMPRESSION: 1. Endotracheal tube with tip approximately 1 cm above the carina. 2. Cardiomegaly with vascular congestion. Electronically Signed   By: Anner Crete M.D.   On: 11/16/2021 00:27   US RENAL  Result Date: 11/15/2021 CLINICAL DATA:  Acute renal insufficiency EXAM: RENAL / URINARY TRACT ULTRASOUND COMPLETE COMPARISON:  None Available. FINDINGS: Right Kidney: Renal measurements: 12.4 x 5.0 x 5.4 cm = volume: 174 mL. Limited evaluation without obvious abnormality. Left Kidney: Renal measurements: 12.7 x 5.1 x 5.3 cm = volume: 177 mL. Limited evaluation without obvious abnormality. Bladder: Not visualized Other: Moderate ascites. IMPRESSION: The study is limited but the kidneys are grossly unremarkable. The bladder was not visualized. Moderate ascites. Electronically Signed   By: Dorise Bullion III M.D.   On: 11/15/2021 11:49   VAS [place] LOWER EXTREMITY VENOUS (DVT)  Result Date: 11/15/2021  Lower Venous DVT Study Patient Name:  Kara [name]  Date of Exam:   11/14/2021 Medical Rec #: [id number]      Accession #:    [id number] Date of Birth: [birth year]       Patient Gender: F Patient Age:   36 years Exam Location:  [facility] Procedure:      VAS [place] LOWER EXTREMITY VENOUS (DVT) Referring Phys: Jenny Reichmann PAYNE --------------------------------------------------------------------------------  Indications: Edema. Other Indications: Status post cardiac arrest. Comparison Study: No prior study on file Performing Technologist: Sharion Dove RVS  Examination Guidelines: A complete evaluation includes B-mode imaging, spectral Doppler, color Doppler, and power Doppler as needed of all accessible portions of each  vessel. Bilateral testing is considered an integral part of a complete examination. Limited examinations for reoccurring indications may be performed as noted. The reflux portion of the exam is performed with the patient in reverse Trendelenburg.  +---------+---------------+---------+-----------+----------+--------------+ RIGHT    CompressibilityPhasicitySpontaneityPropertiesThrombus Aging +---------+---------------+---------+-----------+----------+--------------+ CFV      Full           Yes  87* 98*  --   ALT 45*  --  36 36  --   ALKPHOS 265*  --  220* 258*  --   BILITOT 2.2*  --  2.3* 3.4*  --   GFRNONAA 45* 34* 23* 17*  --   ANIONGAP 13 9 8 10   --      Hematology Recent Labs  Lab 11/14/21 0418 11/15/21 0623 11/16/21 0010 11/16/21 0339  WBC 21.3* 8.5 11.5*  --   RBC 4.08 3.61* 3.53*  --   HGB 13.1 11.5* 11.2* 11.9*  HCT 41.8 36.8 36.5 35.0*  MCV 102.5* 101.9* 103.4*  --   MCH 32.1 31.9 31.7  --   MCHC 31.3 31.3 30.7  --   RDW 15.5 15.3 15.4  --   PLT 180 88* 127*  --     Cardiac EnzymesNo results for input(s): "TROPONINI" in the last 168 hours. No results for input(s): "TROPIPOC" in the last 168 hours.   BNP Recent Labs  Lab 11/13/21 1930  BNP 252.7*     DDimer No results for input(s): "DDIMER" in the last 168 hours.   Radiology    DG CHEST PORT 1 VIEW  Result Date: 11/16/2021 CLINICAL DATA:  Intubation. EXAM: PORTABLE CHEST 1 VIEW COMPARISON:  Chest radiograph dated 11/14/2021. FINDINGS: Endotracheal tube  with tip approximately 1 cm above the carina. Enteric tube extends below the diaphragm with side-port in the proximal stomach and tip beyond the inferior margin of the image. Cardiomegaly with vascular congestion. No focal consolidation, pleural effusion, pneumothorax. No acute osseous pathology. IMPRESSION: 1. Endotracheal tube with tip approximately 1 cm above the carina. 2. Cardiomegaly with vascular congestion. Electronically Signed   By: Anner Crete M.D.   On: 11/16/2021 00:27   US RENAL  Result Date: 11/15/2021 CLINICAL DATA:  Acute renal insufficiency EXAM: RENAL / URINARY TRACT ULTRASOUND COMPLETE COMPARISON:  None Available. FINDINGS: Right Kidney: Renal measurements: 12.4 x 5.0 x 5.4 cm = volume: 174 mL. Limited evaluation without obvious abnormality. Left Kidney: Renal measurements: 12.7 x 5.1 x 5.3 cm = volume: 177 mL. Limited evaluation without obvious abnormality. Bladder: Not visualized Other: Moderate ascites. IMPRESSION: The study is limited but the kidneys are grossly unremarkable. The bladder was not visualized. Moderate ascites. Electronically Signed   By: Dorise Bullion III M.D.   On: 11/15/2021 11:49   VAS [place] LOWER EXTREMITY VENOUS (DVT)  Result Date: 11/15/2021  Lower Venous DVT Study Patient Name:  Kara [name]  Date of Exam:   11/14/2021 Medical Rec #: [id number]      Accession #:    [id number] Date of Birth: [birth year]       Patient Gender: F Patient Age:   36 years Exam Location:  [facility] Procedure:      VAS [place] LOWER EXTREMITY VENOUS (DVT) Referring Phys: Jenny Reichmann PAYNE --------------------------------------------------------------------------------  Indications: Edema. Other Indications: Status post cardiac arrest. Comparison Study: No prior study on file Performing Technologist: Sharion Dove RVS  Examination Guidelines: A complete evaluation includes B-mode imaging, spectral Doppler, color Doppler, and power Doppler as needed of all accessible portions of each  vessel. Bilateral testing is considered an integral part of a complete examination. Limited examinations for reoccurring indications may be performed as noted. The reflux portion of the exam is performed with the patient in reverse Trendelenburg.  +---------+---------------+---------+-----------+----------+--------------+ RIGHT    CompressibilityPhasicitySpontaneityPropertiesThrombus Aging +---------+---------------+---------+-----------+----------+--------------+ CFV      Full           Yes  87* 98*  --   ALT 45*  --  36 36  --   ALKPHOS 265*  --  220* 258*  --   BILITOT 2.2*  --  2.3* 3.4*  --   GFRNONAA 45* 34* 23* 17*  --   ANIONGAP 13 9 8 10   --      Hematology Recent Labs  Lab 11/14/21 0418 11/15/21 0623 11/16/21 0010 11/16/21 0339  WBC 21.3* 8.5 11.5*  --   RBC 4.08 3.61* 3.53*  --   HGB 13.1 11.5* 11.2* 11.9*  HCT 41.8 36.8 36.5 35.0*  MCV 102.5* 101.9* 103.4*  --   MCH 32.1 31.9 31.7  --   MCHC 31.3 31.3 30.7  --   RDW 15.5 15.3 15.4  --   PLT 180 88* 127*  --     Cardiac EnzymesNo results for input(s): "TROPONINI" in the last 168 hours. No results for input(s): "TROPIPOC" in the last 168 hours.   BNP Recent Labs  Lab 11/13/21 1930  BNP 252.7*     DDimer No results for input(s): "DDIMER" in the last 168 hours.   Radiology    DG CHEST PORT 1 VIEW  Result Date: 11/16/2021 CLINICAL DATA:  Intubation. EXAM: PORTABLE CHEST 1 VIEW COMPARISON:  Chest radiograph dated 11/14/2021. FINDINGS: Endotracheal tube  with tip approximately 1 cm above the carina. Enteric tube extends below the diaphragm with side-port in the proximal stomach and tip beyond the inferior margin of the image. Cardiomegaly with vascular congestion. No focal consolidation, pleural effusion, pneumothorax. No acute osseous pathology. IMPRESSION: 1. Endotracheal tube with tip approximately 1 cm above the carina. 2. Cardiomegaly with vascular congestion. Electronically Signed   By: Anner Crete M.D.   On: 11/16/2021 00:27   US RENAL  Result Date: 11/15/2021 CLINICAL DATA:  Acute renal insufficiency EXAM: RENAL / URINARY TRACT ULTRASOUND COMPLETE COMPARISON:  None Available. FINDINGS: Right Kidney: Renal measurements: 12.4 x 5.0 x 5.4 cm = volume: 174 mL. Limited evaluation without obvious abnormality. Left Kidney: Renal measurements: 12.7 x 5.1 x 5.3 cm = volume: 177 mL. Limited evaluation without obvious abnormality. Bladder: Not visualized Other: Moderate ascites. IMPRESSION: The study is limited but the kidneys are grossly unremarkable. The bladder was not visualized. Moderate ascites. Electronically Signed   By: Dorise Bullion III M.D.   On: 11/15/2021 11:49   VAS [place] LOWER EXTREMITY VENOUS (DVT)  Result Date: 11/15/2021  Lower Venous DVT Study Patient Name:  Kara [name]  Date of Exam:   11/14/2021 Medical Rec #: [id number]      Accession #:    [id number] Date of Birth: [birth year]       Patient Gender: F Patient Age:   36 years Exam Location:  [facility] Procedure:      VAS [place] LOWER EXTREMITY VENOUS (DVT) Referring Phys: Jenny Reichmann PAYNE --------------------------------------------------------------------------------  Indications: Edema. Other Indications: Status post cardiac arrest. Comparison Study: No prior study on file Performing Technologist: Sharion Dove RVS  Examination Guidelines: A complete evaluation includes B-mode imaging, spectral Doppler, color Doppler, and power Doppler as needed of all accessible portions of each  vessel. Bilateral testing is considered an integral part of a complete examination. Limited examinations for reoccurring indications may be performed as noted. The reflux portion of the exam is performed with the patient in reverse Trendelenburg.  +---------+---------------+---------+-----------+----------+--------------+ RIGHT    CompressibilityPhasicitySpontaneityPropertiesThrombus Aging +---------+---------------+---------+-----------+----------+--------------+ CFV      Full           Yes  a 60Hz  notched filter applied as appropriate. EEG data were recorded continuously and digitally stored.  Video monitoring was available and reviewed as appropriate.  Description: During awake state, no clear posterior dominant rhythm was seen. EEG showed continuous generalized 3-5Hz  theta-delta slowing. Sleep was characterized by vertex waves, sleep spindles (12-14hz ), maximal fronto-central region. Hyperventilation and photic stimulation were not performed.    ABNORMALITY - Continuous slow, generalized  IMPRESSION: This study is suggestive of moderate to severe diffuse encephalopathy, nonspecific etiology. No seizures or epileptiform discharges were seen throughout the recording.  Lora Havens    DG Abd Portable 1V  Result Date: 11/14/2021 CLINICAL DATA:  OG tube placement EXAM: PORTABLE ABDOMEN - 1 VIEW COMPARISON:  Abdominal radiograph 10/22/2019. FINDINGS: Orogastric tube tip and side hole projects over the expected location of the gastric body. Relative paucity of bowel gas in the visualized abdomen. Surgical clips overlie the upper right abdomen. Airspace opacity in the medial left lower lobe may reflect atelectasis versus consolidation. IMPRESSION: 1. Orogastric tube tip and side hole project over the expected location of the gastric body. Relative paucity of bowel gas in  the visualized abdomen. 2. Airspace opacity in the medial left lower lobe may reflect atelectasis versus pneumonia in the appropriate clinical context. Electronically Signed   By: Ileana Roup M.D.   On: 11/14/2021 14:00   ECHOCARDIOGRAM COMPLETE  Result Date: 11/14/2021    ECHOCARDIOGRAM REPORT   Patient Name:   Kara [name] Date of Exam: 11/14/2021 Medical Rec #:  [id number]     Height:       67.0 in Accession #:    [id number]    Weight:       200.4 lb Date of Birth:  May 17, 1985      BSA:          2.024 m Patient Age:    36 years      BP:           101/78 mmHg Patient Gender: F             HR:           98 bpm. Exam Location:  Inpatient Procedure: 2D Echo Indications:    cardiac arrest  History:        Patient has no prior history of Echocardiogram examinations.  Sonographer:    Johny Chess RDCS Referring Phys: [phone] [place]  1. Left ventricular ejection fraction, by estimation, is 45 to 50%. The left ventricle has mildly decreased function. The left ventricle demonstrates global hypokinesis. Indeterminate diastolic filling due to E-A fusion. Tissue Doppler velocities suggest normal myocardial relaxation.  2. Right ventricular systolic function is normal. The right ventricular size is normal. There is normal pulmonary artery systolic pressure. The estimated right ventricular systolic pressure is 123456 mmHg.  3. The mitral valve is normal in structure. No evidence of mitral valve regurgitation. No evidence of mitral stenosis.  4. The aortic valve is normal in structure. Aortic valve regurgitation is not visualized. No aortic stenosis is present.  5. The inferior vena cava is normal in size with greater than 50% respiratory variability, suggesting right atrial pressure of 3 mmHg. FINDINGS  Left Ventricle: Left ventricular ejection fraction, by estimation, is 45 to 50%. The left ventricle has mildly decreased function. The left ventricle demonstrates global hypokinesis. The left ventricular  internal cavity size was normal in size. There is  no left ventricular hypertrophy. Indeterminate diastolic filling due to E-A fusion. Right Ventricle: The right ventricular  87* 98*  --   ALT 45*  --  36 36  --   ALKPHOS 265*  --  220* 258*  --   BILITOT 2.2*  --  2.3* 3.4*  --   GFRNONAA 45* 34* 23* 17*  --   ANIONGAP 13 9 8 10   --      Hematology Recent Labs  Lab 11/14/21 0418 11/15/21 0623 11/16/21 0010 11/16/21 0339  WBC 21.3* 8.5 11.5*  --   RBC 4.08 3.61* 3.53*  --   HGB 13.1 11.5* 11.2* 11.9*  HCT 41.8 36.8 36.5 35.0*  MCV 102.5* 101.9* 103.4*  --   MCH 32.1 31.9 31.7  --   MCHC 31.3 31.3 30.7  --   RDW 15.5 15.3 15.4  --   PLT 180 88* 127*  --     Cardiac EnzymesNo results for input(s): "TROPONINI" in the last 168 hours. No results for input(s): "TROPIPOC" in the last 168 hours.   BNP Recent Labs  Lab 11/13/21 1930  BNP 252.7*     DDimer No results for input(s): "DDIMER" in the last 168 hours.   Radiology    DG CHEST PORT 1 VIEW  Result Date: 11/16/2021 CLINICAL DATA:  Intubation. EXAM: PORTABLE CHEST 1 VIEW COMPARISON:  Chest radiograph dated 11/14/2021. FINDINGS: Endotracheal tube  with tip approximately 1 cm above the carina. Enteric tube extends below the diaphragm with side-port in the proximal stomach and tip beyond the inferior margin of the image. Cardiomegaly with vascular congestion. No focal consolidation, pleural effusion, pneumothorax. No acute osseous pathology. IMPRESSION: 1. Endotracheal tube with tip approximately 1 cm above the carina. 2. Cardiomegaly with vascular congestion. Electronically Signed   By: Anner Crete M.D.   On: 11/16/2021 00:27   US RENAL  Result Date: 11/15/2021 CLINICAL DATA:  Acute renal insufficiency EXAM: RENAL / URINARY TRACT ULTRASOUND COMPLETE COMPARISON:  None Available. FINDINGS: Right Kidney: Renal measurements: 12.4 x 5.0 x 5.4 cm = volume: 174 mL. Limited evaluation without obvious abnormality. Left Kidney: Renal measurements: 12.7 x 5.1 x 5.3 cm = volume: 177 mL. Limited evaluation without obvious abnormality. Bladder: Not visualized Other: Moderate ascites. IMPRESSION: The study is limited but the kidneys are grossly unremarkable. The bladder was not visualized. Moderate ascites. Electronically Signed   By: Dorise Bullion III M.D.   On: 11/15/2021 11:49   VAS [place] LOWER EXTREMITY VENOUS (DVT)  Result Date: 11/15/2021  Lower Venous DVT Study Patient Name:  Kara [name]  Date of Exam:   11/14/2021 Medical Rec #: [id number]      Accession #:    [id number] Date of Birth: [birth year]       Patient Gender: F Patient Age:   36 years Exam Location:  [facility] Procedure:      VAS [place] LOWER EXTREMITY VENOUS (DVT) Referring Phys: Jenny Reichmann PAYNE --------------------------------------------------------------------------------  Indications: Edema. Other Indications: Status post cardiac arrest. Comparison Study: No prior study on file Performing Technologist: Sharion Dove RVS  Examination Guidelines: A complete evaluation includes B-mode imaging, spectral Doppler, color Doppler, and power Doppler as needed of all accessible portions of each  vessel. Bilateral testing is considered an integral part of a complete examination. Limited examinations for reoccurring indications may be performed as noted. The reflux portion of the exam is performed with the patient in reverse Trendelenburg.  +---------+---------------+---------+-----------+----------+--------------+ RIGHT    CompressibilityPhasicitySpontaneityPropertiesThrombus Aging +---------+---------------+---------+-----------+----------+--------------+ CFV      Full           Yes  a 60Hz  notched filter applied as appropriate. EEG data were recorded continuously and digitally stored.  Video monitoring was available and reviewed as appropriate.  Description: During awake state, no clear posterior dominant rhythm was seen. EEG showed continuous generalized 3-5Hz  theta-delta slowing. Sleep was characterized by vertex waves, sleep spindles (12-14hz ), maximal fronto-central region. Hyperventilation and photic stimulation were not performed.    ABNORMALITY - Continuous slow, generalized  IMPRESSION: This study is suggestive of moderate to severe diffuse encephalopathy, nonspecific etiology. No seizures or epileptiform discharges were seen throughout the recording.  Lora Havens    DG Abd Portable 1V  Result Date: 11/14/2021 CLINICAL DATA:  OG tube placement EXAM: PORTABLE ABDOMEN - 1 VIEW COMPARISON:  Abdominal radiograph 10/22/2019. FINDINGS: Orogastric tube tip and side hole projects over the expected location of the gastric body. Relative paucity of bowel gas in the visualized abdomen. Surgical clips overlie the upper right abdomen. Airspace opacity in the medial left lower lobe may reflect atelectasis versus consolidation. IMPRESSION: 1. Orogastric tube tip and side hole project over the expected location of the gastric body. Relative paucity of bowel gas in  the visualized abdomen. 2. Airspace opacity in the medial left lower lobe may reflect atelectasis versus pneumonia in the appropriate clinical context. Electronically Signed   By: Ileana Roup M.D.   On: 11/14/2021 14:00   ECHOCARDIOGRAM COMPLETE  Result Date: 11/14/2021    ECHOCARDIOGRAM REPORT   Patient Name:   Kara [name] Date of Exam: 11/14/2021 Medical Rec #:  [id number]     Height:       67.0 in Accession #:    [id number]    Weight:       200.4 lb Date of Birth:  May 17, 1985      BSA:          2.024 m Patient Age:    36 years      BP:           101/78 mmHg Patient Gender: F             HR:           98 bpm. Exam Location:  Inpatient Procedure: 2D Echo Indications:    cardiac arrest  History:        Patient has no prior history of Echocardiogram examinations.  Sonographer:    Johny Chess RDCS Referring Phys: [phone] [place]  1. Left ventricular ejection fraction, by estimation, is 45 to 50%. The left ventricle has mildly decreased function. The left ventricle demonstrates global hypokinesis. Indeterminate diastolic filling due to E-A fusion. Tissue Doppler velocities suggest normal myocardial relaxation.  2. Right ventricular systolic function is normal. The right ventricular size is normal. There is normal pulmonary artery systolic pressure. The estimated right ventricular systolic pressure is 123456 mmHg.  3. The mitral valve is normal in structure. No evidence of mitral valve regurgitation. No evidence of mitral stenosis.  4. The aortic valve is normal in structure. Aortic valve regurgitation is not visualized. No aortic stenosis is present.  5. The inferior vena cava is normal in size with greater than 50% respiratory variability, suggesting right atrial pressure of 3 mmHg. FINDINGS  Left Ventricle: Left ventricular ejection fraction, by estimation, is 45 to 50%. The left ventricle has mildly decreased function. The left ventricle demonstrates global hypokinesis. The left ventricular  internal cavity size was normal in size. There is  no left ventricular hypertrophy. Indeterminate diastolic filling due to E-A fusion. Right Ventricle: The right ventricular  a 60Hz  notched filter applied as appropriate. EEG data were recorded continuously and digitally stored.  Video monitoring was available and reviewed as appropriate.  Description: During awake state, no clear posterior dominant rhythm was seen. EEG showed continuous generalized 3-5Hz  theta-delta slowing. Sleep was characterized by vertex waves, sleep spindles (12-14hz ), maximal fronto-central region. Hyperventilation and photic stimulation were not performed.    ABNORMALITY - Continuous slow, generalized  IMPRESSION: This study is suggestive of moderate to severe diffuse encephalopathy, nonspecific etiology. No seizures or epileptiform discharges were seen throughout the recording.  Lora Havens    DG Abd Portable 1V  Result Date: 11/14/2021 CLINICAL DATA:  OG tube placement EXAM: PORTABLE ABDOMEN - 1 VIEW COMPARISON:  Abdominal radiograph 10/22/2019. FINDINGS: Orogastric tube tip and side hole projects over the expected location of the gastric body. Relative paucity of bowel gas in the visualized abdomen. Surgical clips overlie the upper right abdomen. Airspace opacity in the medial left lower lobe may reflect atelectasis versus consolidation. IMPRESSION: 1. Orogastric tube tip and side hole project over the expected location of the gastric body. Relative paucity of bowel gas in  the visualized abdomen. 2. Airspace opacity in the medial left lower lobe may reflect atelectasis versus pneumonia in the appropriate clinical context. Electronically Signed   By: Ileana Roup M.D.   On: 11/14/2021 14:00   ECHOCARDIOGRAM COMPLETE  Result Date: 11/14/2021    ECHOCARDIOGRAM REPORT   Patient Name:   Kara [name] Date of Exam: 11/14/2021 Medical Rec #:  [id number]     Height:       67.0 in Accession #:    [id number]    Weight:       200.4 lb Date of Birth:  May 17, 1985      BSA:          2.024 m Patient Age:    36 years      BP:           101/78 mmHg Patient Gender: F             HR:           98 bpm. Exam Location:  Inpatient Procedure: 2D Echo Indications:    cardiac arrest  History:        Patient has no prior history of Echocardiogram examinations.  Sonographer:    Johny Chess RDCS Referring Phys: [phone] [place]  1. Left ventricular ejection fraction, by estimation, is 45 to 50%. The left ventricle has mildly decreased function. The left ventricle demonstrates global hypokinesis. Indeterminate diastolic filling due to E-A fusion. Tissue Doppler velocities suggest normal myocardial relaxation.  2. Right ventricular systolic function is normal. The right ventricular size is normal. There is normal pulmonary artery systolic pressure. The estimated right ventricular systolic pressure is 123456 mmHg.  3. The mitral valve is normal in structure. No evidence of mitral valve regurgitation. No evidence of mitral stenosis.  4. The aortic valve is normal in structure. Aortic valve regurgitation is not visualized. No aortic stenosis is present.  5. The inferior vena cava is normal in size with greater than 50% respiratory variability, suggesting right atrial pressure of 3 mmHg. FINDINGS  Left Ventricle: Left ventricular ejection fraction, by estimation, is 45 to 50%. The left ventricle has mildly decreased function. The left ventricle demonstrates global hypokinesis. The left ventricular  internal cavity size was normal in size. There is  no left ventricular hypertrophy. Indeterminate diastolic filling due to E-A fusion. Right Ventricle: The right ventricular

## 2021-11-16 NOTE — Procedures (Addendum)
Patient Name: Kara [name]  MRN: [id number]  Epilepsy Attending: Charlsie Quest  Referring Physician/Provider: Rejeana Brock, MD  Duration: 11/15/2021 7741 to 11/16/2021 2878   Patient history: 36yo F s/p cardiac arrest. EEG to evaluate for seizure.   Level of alertness: awake, asleep   AEDs during EEG study: Propofol, TPM, GBP   Technical aspects: This EEG study was done with scalp electrodes positioned according to the 10-20 International system of electrode placement. Electrical activity was reviewed with band pass filter of 1-70Hz , sensitivity of 7 uV/mm, display speed of 73mm/sec with a 60Hz  notched filter applied as appropriate. EEG data were recorded continuously and digitally stored.  Video monitoring was available and reviewed as appropriate.   Description: During awake state, no clear posterior dominant rhythm was seen. EEG showed continuous generalized 3-5Hz  theta-delta slowing. Sleep was characterized by vertex waves, sleep spindles (12-14hz ), maximal fronto-central region. Hyperventilation and photic stimulation were not performed.     Event button was pressed on 11/16/2021 at 0711 for upward gaze deviation.  Concomitant EEG before, during and after the event did not show any EEG changes suggest seizure.   ABNORMALITY - Continuous slow, generalized   IMPRESSION: This study is suggestive of moderate to severe diffuse encephalopathy, nonspecific etiology. No seizures or epileptiform discharges were seen throughout the recording.  One event was recorded as described above without concomitant EEG change.  This was most likely not an epileptic event.   Araceli Arango 11/18/2021

## 2021-11-16 NOTE — Progress Notes (Signed)
Dr Katrinka Blazing notified patient with ongoing/return of dyssynchronous and breath stacking. Rate returned from 26 to 24. ETT pulled back from 24 cm to 22 cm by respiratory therapist per MD verbal order. Slight cuff leak present per RT, MD aware. Respiratory pattern improved. RT to get ABG in 30-60 minutes. Vital signs stable. Oliver Barre, RN

## 2021-11-16 NOTE — Progress Notes (Signed)
NAME:  Kara [name], MRN:  [id number], DOB:  Feb 15, 1986, LOS: 3 ADMISSION DATE:  11/13/2021, CONSULTATION DATE:  7/28 REFERRING MD:  Dr. 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  DVT ultrasound negative.  Patient discharged with ABX on 11/07/2021.  During hospital course patient with low K and mag.  During the past few days per husband patient has been nauseous and not able to take her medications.  She has been taking medication for nausea.  On 7/28 in the afternoon, patient's husband states he went out and about 20 minutes later found patient down on floor with agonal respirations.  He called 911 and started CPR.  Fire dept/EMS arrived finding patient to be in VT.  Multiple shocks delivered, epi and amnio given.  About 15 minutes until ROSC was obtained by EMS arrival.  Patient intubated and was given sedation.  Patient transferred to [facility] ED.  Upon arrival to [facility] on 7/28, patient intubated and sedated.  Patient on epi drip.  EKG with no ST elevation but prolonged QTc.  Cardiology consulted.  CXR with no signs of pneumonia; ETT in good position.  Pertinent ED labs: UDS WNL, UA WNL, WBC 23.9, K2.7, glucose 290, AST 178, alk phos 321, troponin 111, ABG 7.24, 45, 156, 19.  CT head and PE chest pending.  Other labs and cultures pending.  Patient noted to be having some posturing and possible myoclonic activity.  Versed given.  EEG ordered.  PCCM consulted for ICU admission.   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  Versed 2 mg push given and restarted propofol and fentanyl Vent changes by nursing overnight ? Rate decreased  ABG this am 7.25/41.9/ 131/20/ 18.3  Objective   Blood pressure 131/74, pulse (!) 124, temperature 98.2 F (36.8 C), resp. rate (!) 25, height 5\' 7"  (1.702 m), weight 93.3 kg, SpO2 97 %.    Vent Mode: PRVC FiO2 (%):  [40 %] 40 % Set Rate:  [24 bmp-26 bmp] 24 bmp Vt Set:  [490 mL] 490 mL PEEP:  [5 cmH20] 5 cmH20 Plateau Pressure:  [18 cmH20-26 cmH20] 26 cmH20   Intake/Output Summary (Last 24 hours) at 11/16/2021 0845 Last data filed at 11/16/2021 0800 Gross per 24 hour  Intake 5061.14 ml  Output 1185 ml  Net 3876.14 ml   Filed Weights   11/14/21 0600 11/15/21 0500 11/16/21 0400  Weight: 90.9 kg 96.1 kg 93.3 kg    Examination: Sedated and intubated , VS stable S1, S2, RRR, Tachy per tele Foley with clear amber urine Bilateral chest excursion, Lung sounds diminished left base . Clear otherwise Heavily sedated 2/2 seizure activity this am and initiation of fentanyl and propofol Multiple wounds , right knee, right chest, Left breast , head  T max  99.3 Mag 2.1, Phos 2.9 K of 3.7, Na 135, Cl 105, CO2 17, BUN 21 Creatinine 3.36  Albumin 2.4, Total Protein 6.1, AST, 98, ALT 36, Alk phos 258, Total Bili 1.8, Indirect Bili 1.6 WBC 11.5, HGB 11.2, Platelets 127   Resolved Hospital Problem list    Hx of splenic vein thrombosis: previously on warfarin complicated by abdominal hematoma requiring IR intervention, not presently on warfarin  Shock liver- improved  Assessment & Plan:  VT/Vfib Cardiac arrest: patient unseen for about 20 minutes; found down  pulseless; found to be in VT shocked multiple times, given epi and amio; ROSC 15 after EMS arrived to seen. Per husband has been having N/V taking anti-nausea meds. Not taking her home meds that husband was aware of that past few days due to this.  UDS neg, alcohol neg. Hx of long qt syndrome Hypokalemia Hx seizures Post arrest encephalopathy New seizure activity 7/31 at 7 am  - TTM2,  - Avoid QT prolonging agents - EP consult depending on neuro recovery - vEEG, appreciate neuro recs - Re-sedated 7/31 am after new seizure activity, will push back plans to attempt to wean/ extubate - Will monitor bedside QTc - EKG prn  Maintain K > 4, Mag > 2  Acute respiratory failure due to above Hx of asthma: On Breo-Ellipta, abuterol, and singulair, no bronchospasm on vent - Nebs as ordered - Vent support, VAP prevention bundle - CPAP/ PS as able once sedation is reduced  Leukocytosis Recent cellulitis RLE: discharged 11/07/2021 on doxy x 7 days but unable to take meds due to N/V Aspiration PNA - f/u wound care recs - Hold further vanc,  f/u culture data - Changed cefepime to rocephin  due to additional seizure activity on 7/31 - Sputum for culture 7/31  Hx of chronic diastolic HF: last echo 2018 EF 60-65% Likely Type II NSTEMI - Echo down a bit - Nothing acute to do here  Hx of anxiety: On quetiapine, topiramate, sertraline History of alcohol use Chronic pain: on oxy at home? -Hold home psych meds indefinitely for now -Thiamine, MVI, folate -Monitor for signs of alcohol withdrawal -hold home pain meds; sedation above - QTc monitoring as above  HTN/HLD -Hold home spironolactone/crestor for now  Hx of pancreatitis -PTA creon on hold CMET in am 8/1  Choledocholithiasis-  Total Bili , Alk phos , LFT's elevated  GERD -PPI - Consider Abdominal US  Post arrest acute kidney injury- due to hypotension - Avoid nephrotoxins - Check renal US - Gentle hydration - Mg  repleted  Thrombocytopenia-  Will stop Lovenox as worsening renal function  Start SQ heparin   Nutrition TF stopped for several hours overnight  - Continue to monitor - Thiamine and Folate Consulting services: wound care, neuro, cardiology  Best Practice (right click and "Reselect all SmartList Selections" daily)   Diet/type: TF DVT prophylaxis: lovenox ppx GI prophylaxis: PPI Lines: IO to be removed Foley: yes Code Status:  full code Last date of multidisciplinary goals of care discussion [7/30 husband and mother updated at bedside.]  45 min cc time Bevelyn Ngo, MSN, AGACNP-BC [facility] Pulmonary/Critical Care Medicine See Amion for personal pager PCCM on call pager [phone]  11/16/2021 9:56 AM

## 2021-11-16 NOTE — Progress Notes (Signed)
RT retracted ETT from 24cm at the lip to 22cm at the lip per MD verbal order.

## 2021-11-16 NOTE — Progress Notes (Signed)
Patient developed seizure activity, eyes deviated upwards, non-reactive to pain, twitching of legs. Button hit on EEG for marker. Versed 2 mg push given and restarted propofol and fentanyl. Day shift nurse [place], RN present and witnessed event. Vitals stable. Oliver Barre, RN

## 2021-11-17 ENCOUNTER — Inpatient Hospital Stay (HOSPITAL_COMMUNITY): Payer: BC Managed Care – PPO

## 2021-11-17 DIAGNOSIS — N179 Acute kidney failure, unspecified: Secondary | ICD-10-CM

## 2021-11-17 DIAGNOSIS — R251 Tremor, unspecified: Secondary | ICD-10-CM

## 2021-11-17 DIAGNOSIS — I469 Cardiac arrest, cause unspecified: Secondary | ICD-10-CM | POA: Diagnosis not present

## 2021-11-17 DIAGNOSIS — G9340 Encephalopathy, unspecified: Secondary | ICD-10-CM | POA: Diagnosis not present

## 2021-11-17 DIAGNOSIS — J9600 Acute respiratory failure, unspecified whether with hypoxia or hypercapnia: Secondary | ICD-10-CM | POA: Diagnosis not present

## 2021-11-17 DIAGNOSIS — R569 Unspecified convulsions: Secondary | ICD-10-CM

## 2021-11-17 DIAGNOSIS — R9431 Abnormal electrocardiogram [ECG] [EKG]: Secondary | ICD-10-CM | POA: Diagnosis not present

## 2021-11-17 DIAGNOSIS — R4689 Other symptoms and signs involving appearance and behavior: Secondary | ICD-10-CM

## 2021-11-17 LAB — COMPREHENSIVE METABOLIC PANEL
ALT: 36 U/L (ref 0–44)
AST: 83 U/L — ABNORMAL HIGH (ref 15–41)
Albumin: 2.2 g/dL — ABNORMAL LOW (ref 3.5–5.0)
Alkaline Phosphatase: 288 U/L — ABNORMAL HIGH (ref 38–126)
Anion gap: 10 (ref 5–15)
BUN: 25 mg/dL — ABNORMAL HIGH (ref 6–20)
CO2: 17 mmol/L — ABNORMAL LOW (ref 22–32)
Calcium: 9 mg/dL (ref 8.9–10.3)
Chloride: 108 mmol/L (ref 98–111)
Creatinine, Ser: 3.4 mg/dL — ABNORMAL HIGH (ref 0.44–1.00)
GFR, Estimated: 17 mL/min — ABNORMAL LOW (ref >60)
Glucose, Bld: 124 mg/dL — ABNORMAL HIGH (ref 70–99)
Potassium: 4.6 mmol/L (ref 3.5–5.1)
Sodium: 135 mmol/L (ref 135–145)
Total Bilirubin: 3.7 mg/dL — ABNORMAL HIGH (ref 0.3–1.2)
Total Protein: 5.9 g/dL — ABNORMAL LOW (ref 6.5–8.1)

## 2021-11-17 LAB — CBC WITH DIFFERENTIAL/PLATELET
Abs Immature Granulocytes: 0.15 10*3/uL — ABNORMAL HIGH (ref 0.00–0.07)
Basophils Absolute: 0.1 10*3/uL (ref 0.0–0.1)
Basophils Relative: 1 %
Eosinophils Absolute: 0.1 10*3/uL (ref 0.0–0.5)
Eosinophils Relative: 1 %
HCT: 35.3 % — ABNORMAL LOW (ref 36.0–46.0)
Hemoglobin: 11.6 g/dL — ABNORMAL LOW (ref 12.0–15.0)
Immature Granulocytes: 1 %
Lymphocytes Relative: 11 %
Lymphs Abs: 1.2 10*3/uL (ref 0.7–4.0)
MCH: 32.4 pg (ref 26.0–34.0)
MCHC: 32.9 g/dL (ref 30.0–36.0)
MCV: 98.6 fL (ref 80.0–100.0)
Monocytes Absolute: 0.9 10*3/uL (ref 0.1–1.0)
Monocytes Relative: 8 %
Neutro Abs: 8.5 10*3/uL — ABNORMAL HIGH (ref 1.7–7.7)
Neutrophils Relative %: 78 %
Platelets: 79 10*3/uL — ABNORMAL LOW (ref 150–400)
RBC: 3.58 MIL/uL — ABNORMAL LOW (ref 3.87–5.11)
RDW: 14.8 % (ref 11.5–15.5)
WBC: 11 10*3/uL — ABNORMAL HIGH (ref 4.0–10.5)
nRBC: 0 % (ref 0.0–0.2)

## 2021-11-17 LAB — POCT I-STAT 7, (LYTES, BLD GAS, ICA,H+H)
Acid-base deficit: 7 mmol/L — ABNORMAL HIGH (ref 0.0–2.0)
Bicarbonate: 17.7 mmol/L — ABNORMAL LOW (ref 20.0–28.0)
Calcium, Ion: 1.29 mmol/L (ref 1.15–1.40)
HCT: 34 % — ABNORMAL LOW (ref 36.0–46.0)
Hemoglobin: 11.6 g/dL — ABNORMAL LOW (ref 12.0–15.0)
O2 Saturation: 99 %
Potassium: 3.9 mmol/L (ref 3.5–5.1)
Sodium: 137 mmol/L (ref 135–145)
TCO2: 19 mmol/L — ABNORMAL LOW (ref 22–32)
pCO2 arterial: 32.6 mmHg (ref 32–48)
pH, Arterial: 7.344 — ABNORMAL LOW (ref 7.35–7.45)
pO2, Arterial: 126 mmHg — ABNORMAL HIGH (ref 83–108)

## 2021-11-17 LAB — PHOSPHORUS: Phosphorus: 3.1 mg/dL (ref 2.5–4.6)

## 2021-11-17 LAB — GLUCOSE, CAPILLARY
Glucose-Capillary: 107 mg/dL — ABNORMAL HIGH (ref 70–99)
Glucose-Capillary: 115 mg/dL — ABNORMAL HIGH (ref 70–99)
Glucose-Capillary: 117 mg/dL — ABNORMAL HIGH (ref 70–99)
Glucose-Capillary: 125 mg/dL — ABNORMAL HIGH (ref 70–99)
Glucose-Capillary: 129 mg/dL — ABNORMAL HIGH (ref 70–99)
Glucose-Capillary: 132 mg/dL — ABNORMAL HIGH (ref 70–99)
Glucose-Capillary: 95 mg/dL (ref 70–99)

## 2021-11-17 LAB — MAGNESIUM: Magnesium: 1.9 mg/dL (ref 1.7–2.4)

## 2021-11-17 LAB — LIPASE, BLOOD: Lipase: 67 U/L — ABNORMAL HIGH (ref 11–51)

## 2021-11-17 LAB — TRIGLYCERIDES: Triglycerides: 348 mg/dL — ABNORMAL HIGH (ref <150)

## 2021-11-17 LAB — TOPIRAMATE LEVEL: Topiramate Lvl: <1.5 ug/mL — ABNORMAL LOW (ref 2.0–25.0)

## 2021-11-17 MED ORDER — VITAL HIGH PROTEIN PO LIQD: 1000.0000 mL | ORAL | Status: DC

## 2021-11-17 MED ORDER — MAGNESIUM SULFATE 2 GM/50ML IV SOLN
2.0000 g | Freq: Once | INTRAVENOUS | Status: AC
  Administered 2021-11-17: 2 g via INTRAVENOUS
  Filled 2021-11-17: qty 50

## 2021-11-17 MED ORDER — METOCLOPRAMIDE HCL 5 MG/ML IJ SOLN
10.0000 mg | Freq: Once | INTRAMUSCULAR | Status: AC
  Administered 2021-11-17: 10 mg via INTRAVENOUS
  Filled 2021-11-17: qty 2

## 2021-11-17 MED ORDER — OXYCODONE HCL 5 MG PO TABS
10.0000 mg | ORAL_TABLET | Freq: Three times a day (TID) | ORAL | Status: DC
  Administered 2021-11-17 – 2021-11-23 (×17): 10 mg
  Filled 2021-11-17 (×17): qty 2

## 2021-11-17 NOTE — TOC Initial Note (Signed)
Transition of Care [facility]) - Initial/Assessment Note    Patient Details  Name: Kara [name] MRN: [id number] Date of Birth: 21-Apr-1985  Transition of Care [facility]) CM/SW Contact:    Gala Lewandowsky, RN Phone Number: 11/17/2021, 3:38 PM  Clinical Narrative: Patient presented due to cardiac arrest-intubated and sedated.  Unable to complete the Risk for Readmission Assessment. Patient was discussed in rounds and patient continues on Fentanyl and Levophed gtt. Case Manager will continue to follow for transition of care needs.                Barriers to Discharge: Continued Medical Work up  Expected Discharge Plan and Services     Discharge Planning Services: CM Consult   Living arrangements for the past 2 months: Single Family Home  Prior Living Arrangements/Services Living arrangements for the past 2 months: Single Family Home Lives with:: Spouse Patient language and need for interpreter reviewed:: Yes        Need for Family Participation in Patient Care: Yes (Comment) Care giver support system in place?: Yes (comment)   Criminal Activity/Legal Involvement Pertinent to Current Situation/Hospitalization: No - Comment as needed  Activities of Daily Living Home Assistive Devices/Equipment: None ADL Screening (condition at time of admission) Patient's cognitive ability adequate to safely complete daily activities?: Yes Is the patient deaf or have difficulty hearing?: No Does the patient have difficulty seeing, even when wearing glasses/contacts?: No Does the patient have difficulty concentrating, remembering, or making decisions?: No Patient able to express need for assistance with ADLs?: No Does the patient have difficulty dressing or bathing?: Yes Independently performs ADLs?: No Communication: Dependent Is this a change from baseline?: Change from baseline, expected to last >3 days Dressing (OT): Dependent Is this a change from baseline?: Change from baseline, expected to  last >3 days Grooming: Dependent Is this a change from baseline?: Change from baseline, expected to last >3 days Feeding: Dependent Is this a change from baseline?: Change from baseline, expected to last >3 days Bathing: Dependent Is this a change from baseline?: Change from baseline, expected to last >3 days Toileting: Dependent Is this a change from baseline?: Change from baseline, expected to last >3days In/Out Bed: Dependent Is this a change from baseline?: Change from baseline, expected to last >3 days Walks in Home: Independent Does the patient have difficulty walking or climbing stairs?: No Weakness of Legs: Both Weakness of Arms/Hands: Both  Permission Sought/Granted Permission sought to share information with : Case Manager, Family Supports  Alcohol / Substance Use: Not Applicable Psych Involvement: No (comment)  Admission diagnosis:  Cardiac arrest [facility]) [I46.9] Patient Active Problem List   Diagnosis Date Noted   AKI (acute kidney injury) (HCC)    Seizure (HCC)    Altered mental status    Cardiac arrest (HCC) 11/13/2021   Acute respiratory failure (HCC)    Prolonged Q-T interval on ECG    Hypokalemia    Acute encephalopathy    PCP:  [name], MD Pharmacy:   [facility] [phone] [place], [place] - [address] AT [facility] [address] [facility] [address] [place] [address] Phone: [phone] Fax: [phone]   Readmission Risk Interventions     No data to display

## 2021-11-17 NOTE — Progress Notes (Signed)
Progress Note  Patient Name: Kara [name] Date of Encounter: 11/17/2021  Primary Cardiologist:   None   Subjective   Vomited yesterday.  Intubated and sedated.    Inpatient Medications    Scheduled Meds:  acetaminophen  650 mg Per Tube Q6H   arformoterol  15 mcg Nebulization BID   budesonide (PULMICORT) nebulizer solution  0.25 mg Nebulization BID   Chlorhexidine Gluconate Cloth  6 each Topical Daily   docusate  100 mg Per Tube BID   fentaNYL (SUBLIMAZE) injection  50 mcg Intravenous Once   folic acid  1 mg Per Tube Daily   gabapentin  300 mg Per Tube Q8H   heparin injection (subcutaneous)  5,000 Units Subcutaneous Q8H   insulin aspart  0-9 Units Subcutaneous Q4H   multivitamin with minerals  1 tablet Per Tube Daily   mouth rinse  15 mL Mouth Rinse Q2H   pantoprazole sodium  40 mg Per Tube Daily   polyethylene glycol  17 g Per Tube Daily   thiamine (VITAMIN B1) injection  100 mg Intravenous Daily   topiramate  100 mg Per Tube BID   Continuous Infusions:  sodium chloride     cefTRIAXone (ROCEPHIN)  IV Stopped (11/16/21 1119)   feeding supplement (VITAL AF 1.2 CAL) Stopped (11/17/21 0100)   fentaNYL infusion INTRAVENOUS 50 mcg/hr (11/17/21 0603)   lactated ringers 75 mL/hr at 11/17/21 0600   norepinephrine (LEVOPHED) Adult infusion Stopped (11/16/21 0654)   propofol (DIPRIVAN) infusion 15 mcg/kg/min (11/17/21 0600)   PRN Meds: albuterol, fentaNYL, midazolam, mouth rinse, pneumococcal 20-valent conjugate vaccine, polyvinyl alcohol   Vital Signs    Vitals:   11/17/21 0314 11/17/21 0400 11/17/21 0500 11/17/21 0600  BP:  (!) 132/92 (!) 121/91 (!) 126/92  Pulse:  93 97 92  Resp:  (!) 28 (!) 28 (!) 28  Temp:  99 F (37.2 C) 98.8 F (37.1 C) 98.6 F (37 C)  TempSrc:      SpO2: 100% 100% 100% 100%  Weight:      Height:        Intake/Output Summary (Last 24 hours) at 11/17/2021 0748 Last data filed at 11/17/2021 0600 Gross per 24 hour  Intake 3314.21 ml  Output  1685 ml  Net 1629.21 ml   Filed Weights   11/14/21 0600 11/15/21 0500 11/16/21 0400  Weight: 90.9 kg 96.1 kg 93.3 kg    Telemetry    NSR, ST - Personally Reviewed  ECG    NSR, rate 112, axis WNL, QTC still prolonged but improved compared to previous.  - Personally Reviewed  Physical Exam   GEN: No  acute distress.   Neck: No  JVD Cardiac: RRR, no murmurs, rubs, or gallops.  Respiratory:   Transmitted upper airway wheezing GI: Soft, nontender, non-distended, normal bowel sounds  MS:  Mild diffuse edema; No deformity. Neuro:   Unable to assess  Psych: Unable to assess   Labs    Chemistry Recent Labs  Lab 11/15/21 0028 11/16/21 0010 11/16/21 0339 11/16/21 1107 11/17/21 0413  NA 140 135 137 135 135  K 4.0 3.7 3.7 4.8 4.6  CL 111 108  --   --  108  CO2 21* 17*  --   --  17*  GLUCOSE 129* 117*  --   --  124*  BUN 17 21*  --   --  25*  CREATININE 2.66* 3.36*  --   --  3.40*  CALCIUM 8.2* 8.6*  --   --  9.0  PROT 5.9* 6.1*  --   --  5.9*  ALBUMIN 2.4* 2.4*  --   --  2.2*  AST 87* 98*  --   --  83*  ALT 36 36  --   --  36  ALKPHOS 220* 258*  --   --  288*  BILITOT 2.3* 3.4*  --   --  3.7*  GFRNONAA 23* 17*  --   --  17*  ANIONGAP 8 10  --   --  10     Hematology Recent Labs  Lab 11/15/21 0623 11/16/21 0010 11/16/21 0339 11/16/21 1107 11/17/21 0413  WBC 8.5 11.5*  --   --  11.0*  RBC 3.61* 3.53*  --   --  3.58*  HGB 11.5* 11.2* 11.9* 10.9* 11.6*  HCT 36.8 36.5 35.0* 32.0* 35.3*  MCV 101.9* 103.4*  --   --  98.6  MCH 31.9 31.7  --   --  32.4  MCHC 31.3 30.7  --   --  32.9  RDW 15.3 15.4  --   --  14.8  PLT 88* 127*  --   --  79*    Cardiac EnzymesNo results for input(s): "TROPONINI" in the last 168 hours. No results for input(s): "TROPIPOC" in the last 168 hours.   BNP Recent Labs  Lab 11/13/21 1930  BNP 252.7*     DDimer No results for input(s): "DDIMER" in the last 168 hours.   Radiology    DG Abd Portable 1V  Result Date:  11/16/2021 CLINICAL DATA:  Post CPR for cardiac arrest EXAM: PORTABLE ABDOMEN - 1 VIEW COMPARISON:  Abdominal x-ray 11/14/2021 FINDINGS: Enteric tube tip is in the body of the stomach. There are surgical clips in the abdomen similar to the prior study. Rectal catheter in place. The bowel gas pattern is normal. No radio-opaque calculi or other significant radiographic abnormality are seen. IMPRESSION: 1. Nonobstructive bowel gas pattern. 2. Enteric tube tip is in the mid stomach. Electronically Signed   By: Darliss Cheney M.D.   On: 11/16/2021 16:06   DG CHEST PORT 1 VIEW  Result Date: 11/16/2021 CLINICAL DATA:  36 year old female presenting for evaluation of ETT positioning. EXAM: PORTABLE CHEST 1 VIEW COMPARISON:  November 16, 2021 FINDINGS: Endotracheal tube has been retracted approximately 4.9 cm now above the carina, tip between clavicular heads. EKG leads project over the chest. Gastric tube courses through in off the field of the radiograph into the stomach, incompletely evaluated. Cardiomediastinal contours and hilar structures are stable. No lobar consolidation. Subtle LEFT basilar airspace disease. No visible pneumothorax. No acute skeletal process on limited assessment. IMPRESSION: 1. Endotracheal tube has been retracted approximately 4.9 cm above the carina, tip between the clavicular heads. 2. Subtle LEFT basilar airspace disease, increased from the prior study in this patient with reported possible aspiration on previous CT. Electronically Signed   By: Donzetta Kohut M.D.   On: 11/16/2021 09:42   DG CHEST PORT 1 VIEW  Result Date: 11/16/2021 CLINICAL DATA:  Intubation. EXAM: PORTABLE CHEST 1 VIEW COMPARISON:  Chest radiograph dated 11/14/2021. FINDINGS: Endotracheal tube with tip approximately 1 cm above the carina. Enteric tube extends below the diaphragm with side-port in the proximal stomach and tip beyond the inferior margin of the image. Cardiomegaly with vascular congestion. No focal  consolidation, pleural effusion, pneumothorax. No acute osseous pathology. IMPRESSION: 1. Endotracheal tube with tip approximately 1 cm above the carina. 2. Cardiomegaly with vascular congestion. Electronically Signed   By:  Anner Crete M.D.   On: 11/16/2021 00:27   US RENAL  Result Date: 11/15/2021 CLINICAL DATA:  Acute renal insufficiency EXAM: RENAL / URINARY TRACT ULTRASOUND COMPLETE COMPARISON:  None Available. FINDINGS: Right Kidney: Renal measurements: 12.4 x 5.0 x 5.4 cm = volume: 174 mL. Limited evaluation without obvious abnormality. Left Kidney: Renal measurements: 12.7 x 5.1 x 5.3 cm = volume: 177 mL. Limited evaluation without obvious abnormality. Bladder: Not visualized Other: Moderate ascites. IMPRESSION: The study is limited but the kidneys are grossly unremarkable. The bladder was not visualized. Moderate ascites. Electronically Signed   By: Dorise Bullion III M.D.   On: 11/15/2021 11:49   Overnight EEG with video  Result Date: 11/15/2021 Lora Havens, MD     11/15/2021  9:25 AM Patient Name: Rania Cottom MRN: [id number] Epilepsy Attending: Lora Havens Referring Physician/Provider: Greta Doom, MD Duration: 11/14/2021 [id number] to 11/15/2021 [id number]  Patient history: 36yo F s/p cardiac arrest. EEG to evaluate for seizure.  Level of alertness: awake, asleep  AEDs during EEG study: Propofol, TPM, GBP, VPA  Technical aspects: This EEG study was done with scalp electrodes positioned according to the 10-20 International system of electrode placement. Electrical activity was reviewed with band pass filter of 1-70Hz , sensitivity of 7 uV/mm, display speed of 65mm/sec with a 60Hz  notched filter applied as appropriate. EEG data were recorded continuously and digitally stored.  Video monitoring was available and reviewed as appropriate.  Description: During awake state, no clear posterior dominant rhythm was seen. EEG showed continuous generalized 3-5Hz  theta-delta slowing. Sleep was  characterized by vertex waves, sleep spindles (12-14hz ), maximal fronto-central region. Hyperventilation and photic stimulation were not performed.    ABNORMALITY - Continuous slow, generalized  IMPRESSION: This study is suggestive of moderate to severe diffuse encephalopathy, nonspecific etiology. No seizures or epileptiform discharges were seen throughout the recording.  Lora Havens     Cardiac Studies   Echo:  see above.   Patient Profile     36 y.o. female with VT/VF arrest.  5 PM 7/28.  Husband started compressions.  Shocked in the field by EMS.  Amiodarone.  Hypokalemia noted on admission.  Initial troponin 111 subsequent 648.    Assessment & Plan    Cardiac arrest:  VT/VFib.   Plan EP evaluation before discharge.  Possibly related to QT prolongation.   Cardiomyopathy:   Mildly reduced will follow and likely ischemia work up pending recovery.  However, likely non ischemic etiology.   AKI:  Creat unchanged.  Non oliguric.  Suspect ATN with slow recovery.   QT prolonged:  QT still prolonged but improved.  Avoid QT prolonging drugs.  Supplemented electrolytes.      For questions or updates, please contact [place] Please consult [email or web address] for contact info under Cardiology/STEMI.   Signed, Minus Breeding, MD  11/17/2021, 7:48 AM

## 2021-11-17 NOTE — Progress Notes (Signed)
LTM maint complete - redness noted on forehead area. Electrodes were moved to prevent any further breakdown. Electrode site FP1 FP2 F8 F7

## 2021-11-17 NOTE — Progress Notes (Signed)
NAME:  Kara [name], MRN:  [id number], DOB:  09-21-1985, LOS: 4 ADMISSION DATE:  11/13/2021, CONSULTATION DATE:  7/28 REFERRING MD:  Dr. 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  DVT ultrasound negative.  Patient discharged with ABX on 11/07/2021.  During hospital course patient with low K and mag.  During the past few days per husband patient has been nauseous and not able to take her medications.  She has been taking medication for nausea.  On 7/28 in the afternoon, patient's husband states he went out and about 20 minutes later found patient down on floor with agonal respirations.  He called 911 and started CPR.  Fire dept/EMS arrived finding patient to be in VT.  Multiple shocks delivered, epi and amnio given.  About 15 minutes until ROSC was obtained by EMS arrival.  Patient intubated and was given sedation.  Patient transferred to [facility] ED.  Upon arrival to [facility] on 7/28, patient intubated and sedated.  Patient on epi drip.  EKG with no ST elevation but prolonged QTc.  Cardiology consulted.  CXR with no signs of pneumonia; ETT in good position.  Pertinent ED labs: UDS WNL, UA WNL, WBC 23.9, K2.7, glucose 290, AST 178, alk phos 321, troponin 111, ABG 7.24, 45, 156, 19.  CT head and PE chest pending.  Other labs and cultures pending.  Patient noted to be having some posturing and possible myoclonic activity.  Versed given.  EEG ordered.  PCCM consulted for ICU admission.   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 Tube feed stopped  +10L this admission, x2 emesis overnight, x1 stool, 1700 ML UOP, +1.5L past 24 hours  Unable to obtain subjective evaluation due to patient status  Drips: propofol increased from 14 to , fentanyl increased from 50 to Remains off levophed  EEG ongoing  Objective   Blood pressure (!) 126/92, pulse 92, temperature 98.6 F (37 C), resp. rate (!) 28, height 5\' 7"  (1.702 m), weight 93.3 kg, SpO2 100 %.    Vent Mode: PRVC FiO2 (%):  [40 %] 40 % Set Rate:  [24 bmp-28 bmp] 28 bmp Vt Set:  [490 mL] 490 mL PEEP:  [5 cmH20] 5 cmH20 Plateau Pressure:  [20 cmH20-26 cmH20] 20 cmH20   Intake/Output Summary (Last 24 hours) at 11/17/2021 0700 Last data filed at 11/17/2021 0600 Gross per 24 hour  Intake 3314.21 ml  Output 1785 ml  Net 1529.21 ml   Filed Weights   11/14/21 0600 11/15/21 0500 11/16/21 0400  Weight: 90.9 kg 96.1 kg 93.3 kg    Examination: General: In bed, NAD, appears comfortable, HEENT: MM pink/moist, anicteric, atraumatic Neuro: sedated, PERRL 3mm, RASS -4 CV: S1S2, NSR, no m/r/g appreciated PULM:  air movement in all lobes, trachea midline, chest expansion symmetric, thin clear secretions GI: soft, bsx4 hypoactive, rounded, unable  to assess tenderness Extremities: cool/dry, trace pretibial edema, capillary refill less than 3 seconds  Skin:  no rashes or lesions noted  Labs/imaging Tracheal aspirate pending BC NGTD day 3 BG 121-196 Creat 2.66>3.36>3.4, BUN 17>21>25 AST 98>83 Alk phos 220>258>288 Bili 2.3>3.4>3.7 Wbc 11.5>11.0 Hgb 10.9>11.6 Plt 180>88>127>79 Mg 1.9 KUB: Non obstructive bowel gas pattern per radiology CXR: ETT in stable position, no pneumo, no new infiltrate  noted   Resolved Hospital Problem list   Hx of splenic vein thrombosis: previously on warfarin complicated by abdominal hematoma requiring IR intervention, not presently on warfarin   Assessment & Plan:  VT/Vfib Cardiac arrest: patient unseen for about 20 minutes; found down pulseless; found to be in VT shocked multiple times, given epi and amio; ROSC 15 after EMS arrived to seen. Per husband has been having N/V taking anti-nausea meds. Not taking her home meds that husband was aware of that past few days due to this.  UDS neg, alcohol neg. Hx of long qt syndrome Hypomagnesia  -Continue normothermia protocol -Avoid QT prolonging agents -Goal potassium greater than 4, magnesium greater than 2. Replete Mag. -Monitor bedside Qtc -EKG. -We will consider EP consult depending on neuro recovery. -appreciate cardiology assistance  Hx seizures Post arrest encephalopathy New seizure activity 7/31 at 7 am  -EEG ongoing, appreciate neuro assistance -Continue Topamax 100 mg twice daily -Continue gabapentin per pharmacy -Obtain MRI brain without after EEG discontinued -Discussed with bedside RN regarding weaning sedation  Acute respiratory failure due cardiac arrest Hx of asthma: On Breo-Ellipta, abuterol, and singulair, no bronchospasm on vent Patient underwent spontaneous breathing trial early a.m. of 8/1.  Became very dyssynchronous per bedside nurse with increased peak pressures.  Peak pressures per RT as high as 45 on PRVC.  Propofol increased from 14-40 mcg, fentanyl from 50-200 mcg.  HME appeared saturated on exam.  Changed> peak pressures 25-26.  Patient remains dyssynchronous.  May be under treating pain with history of pain medication use. -LTVV strategy with tidal volumes of 4-8 cc/kg ideal body weight -Goal plateau pressures less than 30 and driving pressures less than 15 -Wean PEEP/FiO2 for SpO2 92-98% -VAP bundle -Daily SAT and SBT -PAD bundle with Propofol gtt and fentanyl  gtt -RASS goal 0 to -1 -Follow intermittent CXR and ABG PRN -Continue Pulmicort and Brovana scheduled, with as needed albuterol -Discussed with bedside nurse on continue to wean sedation.  Will discuss with pharmacy today regarding home narcotic requirement and adjustment to regimen while inpatient.  May add some oxy per tube.  Leukocytosis Recent cellulitis RLE: discharged 11/07/2021 on doxy x 7 days but unable to take meds due to N/V Aspiration PNA Wbc 11.5>11.0, TMAX 99.3 -Wound care per wound care recommendations -Continue Rocephin -Follow-up blood culture and tracheal aspirate  Hx of chronic diastolic HF: last echo 2018 EF 60-65% Likely Type II NSTEMI -Continuous telemetry  Hx of anxiety: On quetiapine, topiramate, sertraline History of alcohol use Chronic pain: on oxy at home? -Continue thiamine, multivitamin, folate -On propofol, monitor for signs of alcohol withdrawal -On fentanyl drip, may add some per tube oxy -QTc monitoring as above -Continue holding home psych meds  HTN/HLD -Continue holding home spironolactone/Crestor  Hx of pancreatitis Recurrent emesis Choledocholithiasis as seen on [place] on ABD on 7/28 Shock liver: Total Bili , Alk phos , LFT's elevated, AST 98>83, Alk phos 220>258>288, Bili 2.3>3.4>3.7 GERD -Check lipase -Resume trickle tube feeding afternoon -Will discuss with Dr. Merrily Pew regarding obtaining additional imaging -Continue PPI -home Creon on hold  Post arrest acute kidney injury- due to hypotension Creat 2.66>3.36>3.4, BUN 17>21>25, UOP doubled in past 24 hours, hoping creatine is lagging -Ensure renal perfusion. Goal MAP 65 or greater. -Avoid neprotoxic drugs as possible. -Strict I&O's -Follow up AM creatinine -continue foley -Continue hydration with LR at 75  Thrombocytopenia Plt 180>88>127>79 -Monitor on am CBC  Nutrition Tube feeds stopped since 1 AM 8/1 -appreciate RD assistance  Consulting services: wound care, neuro,  cardiology  Best Practice (right click and "Reselect all SmartList Selections" daily)   Diet/type: TF DVT prophylaxis: Heparin GI prophylaxis: PPI Lines: n/A Foley: yes Code Status:  full code Last date of multidisciplinary goals of care discussion [8/1 husband and mother updated at bedside.]  Critical care time: 46 minutes  Gershon Mussel., MSN, APRN, AGACNP-BC [facility]  11/17/2021 , 7:00 AM  Please see [email or web address] for pager details  If no response, please call [phone] After hours, please call Elink at [phone]

## 2021-11-17 NOTE — Progress Notes (Signed)
LTM maint complete - no skin breakdown Fp1 Fp2  Serviced P3 [facility] monitored, Event button test confirmed by [facility].

## 2021-11-17 NOTE — Procedures (Signed)
Patient Name: Kara [name]  MRN: [id number]  Epilepsy Attending: Charlsie Quest  Referring Physician/Provider: Rejeana Brock, MD  Duration: 11/16/2021 5631 to 11/17/2021 4970   Patient history: 36yo F s/p cardiac arrest. EEG to evaluate for seizure.   Level of alertness: lethargic/sedated   AEDs during EEG study: Propofol, TPM, GBP   Technical aspects: This EEG study was done with scalp electrodes positioned according to the 10-20 International system of electrode placement. Electrical activity was reviewed with band pass filter of 1-70Hz , sensitivity of 7 uV/mm, display speed of 57mm/sec with a 60Hz  notched filter applied as appropriate. EEG data were recorded continuously and digitally stored.  Video monitoring was available and reviewed as appropriate.   Description: EEG showed continuous generalized 3-5Hz  theta-delta slowing admixed with 12 to 14 Hz intermittent generalized beta activity hyperventilation and photic stimulation were not performed.      ABNORMALITY - Continuous slow, generalized   IMPRESSION: This study is suggestive of moderate to severe diffuse encephalopathy, nonspecific etiology. No seizures or epileptiform discharges were seen throughout the recording.  Tirza Senteno 

## 2021-11-17 NOTE — Progress Notes (Signed)
Neurology Progress Note  Subjective: Significantly increased on her propofol and fentanyl gtts today compared to yesterday. Per bedside RN, patient with vent dyssynchrony.  Prop @ 40 Fentanyl @ 200   Exam: Vitals:   11/17/21 0600 11/17/21 0803  BP: (!) 126/92   Pulse: 92 (!) 105  Resp: (!) 28 (!) 30  Temp: 98.6 F (37 C)   SpO2: 100% 100%   Gen: Intubated on sedation in the ICU. On propofol and Fentanyl gtts. Propofol paused during assessment x15 min.  Resp: Oral ETT in place, midline mouth. Cough initiated with inline suctioning. Moderate amount of clear oral secretions present with oral suctioning.  Abd: soft, non-tender, non-distended  Neuro: MS: Sedated on propofol @ 40 mcg/kg/min, paused for exam, Fentanyl @ 200. No eye opening on exam today, does not follow commands or nod to communicate possibly related to increased sedation medication.   CN: Pupils reactive bilaterally/sluggish, corneals intact, cough and gag reflexes intact. Does not spontaneously open eyes, does not track examiner, doll's eyes intact Blinks to threat throughout.  Head appears midline.  Motor: Minimal withdraw of bilateral lower extremities. No movement of bilateral upper extremities but she does grimace with application of noxious stimuli throughout. She does not wiggle toes to command today.  Sensory: As above Gait: Deferred for patient safety  Pertinent Labs: Creatinine 2.66 Lab Results  Component Value Date   WBC 11.0 (H) 11/17/2021   HGB 11.6 (L) 11/17/2021   HCT 35.3 (L) 11/17/2021   MCV 98.6 11/17/2021   PLT 79 (L) 11/17/2021   CMP     Component Value Date/Time   NA 135 11/17/2021 0413   K 4.6 11/17/2021 0413   CL 108 11/17/2021 0413   CO2 17 (L) 11/17/2021 0413   GLUCOSE 124 (H) 11/17/2021 0413   BUN 25 (H) 11/17/2021 0413   CREATININE 3.40 (H) 11/17/2021 0413   CALCIUM 9.0 11/17/2021 0413   PROT 5.9 (L) 11/17/2021 0413   ALBUMIN 2.2 (L) 11/17/2021 0413   AST 83 (H) 11/17/2021 0413    ALT 36 11/17/2021 0413   ALKPHOS 288 (H) 11/17/2021 0413   BILITOT 3.7 (H) 11/17/2021 0413   GFRNONAA 17 (L) 11/17/2021 0413   Imaging:  EEG overnight 7/31 - 8/1:  "This study is suggestive of moderate to severe diffuse encephalopathy, nonspecific etiology. No seizures or epileptiform discharges were seen throughout the recording."  Impression: 36 year old female with a history of seizures who presented s/p cardiac arrest, presumably due to prolonged QTc. She was resumed on her home dose of Topamax, and a renal dose of gabapentin. There was initial concern for possible missed medication doses but family reported today that she has not missed any medications. Pharmacy on board to continue assistance with dosing of gabapentin as she fluctuates. EEG monitoring since admission without evidence of seizures or epileptiform discharges. Will continue to monitor as sedation is weaned.   Recommendations: 1) continue Topamax 100 mg twice daily 2) continue gabapentin (equivalent to 900 mg twice daily with normal renal function, pharmacy consult to adjust) 3) continue LTM EEG as patient is weaned off of sedation as appropriate 4) MRI brain wo after EEG d/c'd 5) discussed with CCM -- Lanae Boast, AGACNP-BC [facility] [phone]  If 7pm- 7am, please page neurology on call as listed in [facility]  Neurology Attending Attestation   I examined the patient and discussed plan with Ms. Toberman NP. Above note has been edited by me to reflect my findings and recommendations. Patient had an event yesterday ~  0700 (7/31) involving looking up and tremoring. EEG independently reviewed by me which showed no ictal correlate. Semiology by video also not c/w epileptic etiology. Suspicion for sz is low but we will continue LTM until sedation is weaned given her hx epilepsy. Continue to wean sedation as able.  This patient is critically ill and at significant risk of neurological worsening, death and care  requires constant monitoring of vital signs, hemodynamics,respiratory and cardiac monitoring, neurological assessment, discussion with family, other specialists and medical decision making of high complexity. I spent 45 minutes of neurocritical care time  in the care of  this patient. This was time spent independent of any time provided by nurse practitioner or PA.  Bing Neighbors, MD [facility] [phone]  If 7pm- 7am, please page neurology on call as listed in AMION.

## 2021-11-17 NOTE — Progress Notes (Signed)
Patient vomited this shift. Residual checked 60 cc. Suctioned patient's mouth and airway. No tube feed found in ETT tube. Tube feeds off. Elink Notified Will continue to monitor.

## 2021-11-18 ENCOUNTER — Inpatient Hospital Stay (HOSPITAL_COMMUNITY): Payer: BC Managed Care – PPO

## 2021-11-18 DIAGNOSIS — N179 Acute kidney failure, unspecified: Secondary | ICD-10-CM | POA: Diagnosis not present

## 2021-11-18 DIAGNOSIS — G9340 Encephalopathy, unspecified: Secondary | ICD-10-CM | POA: Diagnosis not present

## 2021-11-18 DIAGNOSIS — I469 Cardiac arrest, cause unspecified: Secondary | ICD-10-CM | POA: Diagnosis not present

## 2021-11-18 DIAGNOSIS — J9601 Acute respiratory failure with hypoxia: Secondary | ICD-10-CM | POA: Diagnosis not present

## 2021-11-18 LAB — BASIC METABOLIC PANEL
Anion gap: 13 (ref 5–15)
Anion gap: 13 (ref 5–15)
BUN: 31 mg/dL — ABNORMAL HIGH (ref 6–20)
BUN: 39 mg/dL — ABNORMAL HIGH (ref 6–20)
CO2: 16 mmol/L — ABNORMAL LOW (ref 22–32)
CO2: 15 mmol/L — ABNORMAL LOW (ref 22–32)
Calcium: 8.9 mg/dL (ref 8.9–10.3)
Calcium: 9.1 mg/dL (ref 8.9–10.3)
Chloride: 105 mmol/L (ref 98–111)
Chloride: 106 mmol/L (ref 98–111)
Creatinine, Ser: 2.92 mg/dL — ABNORMAL HIGH (ref 0.44–1.00)
Creatinine, Ser: 2.98 mg/dL — ABNORMAL HIGH (ref 0.44–1.00)
GFR, Estimated: 21 mL/min — ABNORMAL LOW (ref >60)
GFR, Estimated: 20 mL/min — ABNORMAL LOW (ref >60)
Glucose, Bld: 136 mg/dL — ABNORMAL HIGH (ref 70–99)
Glucose, Bld: 108 mg/dL — ABNORMAL HIGH (ref 70–99)
Potassium: 3.5 mmol/L (ref 3.5–5.1)
Potassium: 3.3 mmol/L — ABNORMAL LOW (ref 3.5–5.1)
Sodium: 134 mmol/L — ABNORMAL LOW (ref 135–145)
Sodium: 134 mmol/L — ABNORMAL LOW (ref 135–145)

## 2021-11-18 LAB — CULTURE, BLOOD (ROUTINE X 2)
Culture: NO GROWTH
Culture: NO GROWTH
Special Requests: ADEQUATE
Special Requests: ADEQUATE

## 2021-11-18 LAB — CULTURE, RESPIRATORY W GRAM STAIN: Culture: NORMAL

## 2021-11-18 LAB — CBC
HCT: 33.9 % — ABNORMAL LOW (ref 36.0–46.0)
Hemoglobin: 11.1 g/dL — ABNORMAL LOW (ref 12.0–15.0)
MCH: 31.7 pg (ref 26.0–34.0)
MCHC: 32.7 g/dL (ref 30.0–36.0)
MCV: 96.9 fL (ref 80.0–100.0)
Platelets: 102 10*3/uL — ABNORMAL LOW (ref 150–400)
RBC: 3.5 MIL/uL — ABNORMAL LOW (ref 3.87–5.11)
RDW: 15.3 % (ref 11.5–15.5)
WBC: 9.3 10*3/uL (ref 4.0–10.5)
nRBC: 0 % (ref 0.0–0.2)

## 2021-11-18 LAB — HEPATIC FUNCTION PANEL
ALT: 35 U/L (ref 0–44)
AST: 98 U/L — ABNORMAL HIGH (ref 15–41)
Albumin: 2.2 g/dL — ABNORMAL LOW (ref 3.5–5.0)
Alkaline Phosphatase: 280 U/L — ABNORMAL HIGH (ref 38–126)
Bilirubin, Direct: 1.7 mg/dL — ABNORMAL HIGH (ref 0.0–0.2)
Indirect Bilirubin: 1 mg/dL — ABNORMAL HIGH (ref 0.3–0.9)
Total Bilirubin: 2.7 mg/dL — ABNORMAL HIGH (ref 0.3–1.2)
Total Protein: 5.9 g/dL — ABNORMAL LOW (ref 6.5–8.1)

## 2021-11-18 LAB — GLUCOSE, CAPILLARY
Glucose-Capillary: 109 mg/dL — ABNORMAL HIGH (ref 70–99)
Glucose-Capillary: 124 mg/dL — ABNORMAL HIGH (ref 70–99)
Glucose-Capillary: 127 mg/dL — ABNORMAL HIGH (ref 70–99)
Glucose-Capillary: 129 mg/dL — ABNORMAL HIGH (ref 70–99)
Glucose-Capillary: 156 mg/dL — ABNORMAL HIGH (ref 70–99)
Glucose-Capillary: 97 mg/dL (ref 70–99)

## 2021-11-18 LAB — MAGNESIUM
Magnesium: 2.3 mg/dL (ref 1.7–2.4)
Magnesium: 2.3 mg/dL (ref 1.7–2.4)

## 2021-11-18 LAB — PHOSPHORUS: Phosphorus: 2.8 mg/dL (ref 2.5–4.6)

## 2021-11-18 MED ORDER — BACITRACIN-NEOMYCIN-POLYMYXIN OINTMENT TUBE
TOPICAL_OINTMENT | CUTANEOUS | Status: DC | PRN
  Filled 2021-11-18: qty 14

## 2021-11-18 MED ORDER — FUROSEMIDE 10 MG/ML IJ SOLN
40.0000 mg | Freq: Once | INTRAMUSCULAR | Status: AC
  Administered 2021-11-18: 40 mg via INTRAVENOUS
  Filled 2021-11-18: qty 4

## 2021-11-18 MED ORDER — POTASSIUM CHLORIDE 20 MEQ PO PACK
40.0000 meq | PACK | Freq: Once | ORAL | Status: AC
Start: 2021-11-18 — End: 2021-11-18
  Administered 2021-11-18: 40 meq via ORAL
  Filled 2021-11-18: qty 2

## 2021-11-18 NOTE — Progress Notes (Signed)
Discussed with Lorin Picket, NP that patient is gagging a lot against ETT. NP gave order to restart fentanyl since patient is going to MRI and EEG is being discontinued.

## 2021-11-18 NOTE — Progress Notes (Signed)
NAME:  Kara [name], MRN:  [id number], DOB:  [birth year], LOS: 5 ADMISSION DATE:  11/13/2021, CONSULTATION DATE:  7/28 REFERRING MD:  Dr. 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  DVT ultrasound negative.  Patient discharged with ABX on 11/07/2021.  During hospital course patient with low K and mag.  During the past few days per husband patient has been nauseous and not able to take her medications.  She has been taking medication for nausea.  On 7/28 in the afternoon, patient's husband states he went out and about 20 minutes later found patient down on floor with agonal respirations.  He called 911 and started CPR.  Fire dept/EMS arrived finding patient to be in VT.  Multiple shocks delivered, epi and amnio given.  About 15 minutes until ROSC was obtained by EMS arrival.  Patient intubated and was given sedation.  Patient transferred to [facility] ED.  Upon arrival to [facility] on 7/28, patient intubated and sedated.  Patient on epi drip.  EKG with no ST elevation but prolonged QTc.  Cardiology consulted.  CXR with no signs of pneumonia; ETT in good position.  Pertinent ED labs: UDS WNL, UA WNL, WBC 23.9, K2.7, glucose 290, AST 178, alk phos 321, troponin 111, ABG 7.24, 45, 156, 19.  CT head and PE chest pending.  Other labs and cultures pending.  Patient noted to be having some posturing and possible myoclonic activity.  Versed given.  EEG ordered.  PCCM consulted for ICU admission.   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 rate 15, height 5\' 7"  (1.702 m), weight 98 kg, SpO2 97 %.    Vent Mode: PRVC FiO2 (%):  [40 %] 40 % Set Rate:  [28 bmp] 28 bmp Vt Set:  [490 mL] 490 mL PEEP:  [5 cmH20] 5 cmH20 Plateau Pressure:  [20 cmH20-22 cmH20] 21 cmH20   Intake/Output Summary (Last 24 hours) at 11/18/2021 9629 Last data filed at 11/18/2021 0800 Gross per 24 hour  Intake 3116.86 ml  Output 916 ml  Net 2200.86 ml   Filed Weights   11/15/21 0500 11/16/21 0400 11/18/21 0400  Weight: 96.1 kg 93.3 kg 98 kg    Examination: General: In bed, NAD, appears comfortable HEENT: MM pink/moist, anicteric, atraumatic Neuro: RASS 0, PERRL 3mm, GCS 11t, wiggles toes to command CV: S1S2, ST, no m/r/g appreciated PULM:  air movement in all lobes, trachea midline, chest expansion symmetric GI: soft, bsx4 active, non-tender   Extremities: warm/dry, no pretibial edema, capillary refill less than 3 seconds  Skin:  no rashes or lesions noted  Labs/imaging Labs/Imaging: WBC 11.0 > 9.3 Hemoglobin 11.6 > 11.1 Platelets 127 > 79 > 102 Sodium 137-134 CO2 17 > 16, anion gap 13 BUN 25 > 31 Magnesium 2.3 AST 98 > 83 > 98 Alk phos 288 > 280 Bilirubin 3.7 > 2.7 Lipase 67   KUB: Increasing gaseous distention of small bowel and right colon, nonspecific, per radiology EEG: per Dr. Melynda Ripple- "This study is suggestive of moderate to severe diffuse encephalopathy, nonspecific etiology. No seizures or epileptiform discharges were seen throughout the  recording."   Resolved Hospital Problem list   Hx of splenic vein thrombosis: previously on warfarin complicated by abdominal hematoma requiring IR intervention, not presently on warfarin   Assessment & Plan:  VT/Vfib Cardiac arrest: patient unseen for about 20 minutes; found down pulseless; found to be in VT shocked multiple times, given epi and amio; ROSC 15 after EMS arrived to seen. Per husband has been having N/V taking anti-nausea meds. Not taking her home meds that husband was aware of that past few days due to this.  UDS neg, alcohol neg. Hx of long qt syndrome Hypokalemia -Continue normothermia protocol -Avoid QT prolonging agents -Goal potassium greater than 4, magnesium greater than 2.  Potassium repleted -Monitor bedside Qtc -Continue bedside telemetry -Appreciate cardiology assistance -Consider EP consult depending on progression  Hx seizures Post arrest encephalopathy New seizure activity 7/31 at 7 am  Patient has been off propofol for greater than 24 hours -EEG ongoing, appreciate her assistance -Continue Topamax 100 mg twice daily -Continue gabapentin for pharmacy -We will obtain MRI brain without contrast after EEG discontinued  Acute respiratory failure with hypoxia due cardiac arrest Hx of asthma: On Breo-Ellipta, abuterol, and singulair, no bronchospasm on vent Patient tolerating SBT's. -LTVV strategy with tidal volumes of 4-8 cc/kg ideal body weight -Goal plateau pressures less than 30 and driving pressures less than 15 -Wean PEEP/FiO2 for SpO2 92-98% -VAP bundle -Daily SAT and SBT -PAD bundle with fentanyl gtt and per tube oxycodone -RASS goal 0 to -1 -Follow intermittent CXR and ABG PRN -Continue scheduled Pulmicort and Brovana, as needed albuterol  Leukocytosis-improving Recent cellulitis RLE: discharged 11/07/2021 on doxy x 7 days but unable to take meds due to N/V Aspiration PNA WBC 11.0 > 9.3.  Rocephin completed 8/1.  Tmax 99 -Wound care per  protocol -Monitor fever/WBC curve -Continue to monitor cultures  Hx of chronic diastolic HF: last echo 2018 EF 60-65% Likely Type II NSTEMI -Continue telemetry  Hx of anxiety: On quetiapine, topiramate, sertraline History of alcohol use Chronic pain: on oxy at home? -Continue thiamine, multivitamin, folate -Propofol off, monitor for signs of alcohol withdrawal -Continue per tube oxy scheduled 10 mg 3 times daily -QTc monitoring as above  HTN/HLD -Continue holding home spironolactone/Crestor  Hx of pancreatitis Recurrent emesis Choledocholithiasis as seen on [place] on ABD on 7/28 Shock liver: Total Bili , Alk phos , LFT's elevated,- improving GERD AST 98 > 83 > 98, Alk phos 288 > 280, Bilirubin 3.7 > 2.7, Lipase 67, last bowel movement on 8/1.  Recurrent emesis.  X1 dose of Reglan.  Suspect emesis secondary to intolerance of endotracheal intubation and inability to give Creon.  KUB 8/1 concerning for gaseous distention.  Enema given. -Continue tube feeds -Continue PPI -Home Creon on hold -Continue miralax and docusate  NAGMA ? Secondary to GI loss, frequent emesis. CO2 17 > 16, anion gap 13. -monitor, supportive care  Post arrest acute kidney injury- due to hypotension/ischemic ATN  UOP -Ensure renal  perfusion. Goal MAP 65 or greater. -Avoid neprotoxic drugs as possible. -Strict I&O's -Follow up AM creatinine -continue foley -lasix 40mg  IV X1  Thrombocytopenia Platelets 127 > 79 > 102, suspect secondary to critical illness -Monitor on a.m. CBC  Nutrition -Continue tube feeds -Place core track  Consulting services: wound care, neuro, cardiology  Best Practice (right click and "Reselect all SmartList Selections" daily)   Diet/type: TF DVT prophylaxis: Heparin GI prophylaxis: PPI Lines: n/A Foley: yes Code Status:  full code Last date of multidisciplinary goals of care discussion [8/2 husband and mother updated at bedside.]  Critical care time: 66  minutes  Gershon Mussel., MSN, APRN, AGACNP-BC [facility]  11/18/2021 , 9:23 AM  Please see [email or web address] for pager details  If no response, please call [phone] After hours, please call Elink at [phone]

## 2021-11-18 NOTE — Progress Notes (Signed)
Nutrition Follow-up  DOCUMENTATION CODES:  Not applicable  INTERVENTION:  If pt remains intubated, recommend restarting TF: Vital AF 1.2 at 63mL/hr (1.68L/d) Start at 40 and advance by 34mL/h q4h to goal of 70 mL/hr  Provides 2016 kcal, 126 gm of protein, and 1362 mL free water daily.  If diet advanced, recommend adding 72,000 units of creon per meal. Consider adding Viokace via tube if TF are restarted to aid in tolerance.  NUTRITION DIAGNOSIS:  Inadequate oral intake related to inability to eat as evidenced by NPO status. - remains applicable  GOAL:  Patient will meet greater than or equal to 90% of their needs - tube in place, TF to be restarted  MONITOR:   Vent status, Labs, I & O's, TF tolerance  REASON FOR ASSESSMENT:   Consult, Ventilator Assessment of nutrition requirement/status  ASSESSMENT:  36 y.o. female presented to the ED after cardiac arrest. Pt recently discharged on 7/22 from [facility] for RLE cellulitis. PMH includes HLD, GERD, CHF, EtOH abuse, HTN, and seizures. Pt admitted with V. Fib cardiac arrest, acute respiratory failure, and acute encephalopathy.  7/28 - intubated  Patient is currently intubated on ventilator support.   Family in room at the time of assessment. States that at baseline appetite is not great. Only consumes a small amount of food at a time and either complains of fullness or abdominal discomfort. Family notes chronic pancreatitis and that pt is on enzymes outpatient. Reviewed home medications and see that 72,000 units of creon are ordered to be taken with each meal. Family reports stable weight recently.   Propofol previously used as sedation was discontinued yesterday. Pt currently on fentanyl.   TF currently on hold. Discussed with RN. Pt has vomited several times including this AM so feeds were held and were also being held for MRI. Attending hoping to extubate this afternoon pending MRI results. Will leave TF recommendations in case they  are needed. If diet is advanced, will need to add creon with meals. Could also consider adding pancreatic enzymes to be giving in conjunction with the tube feed (Viokace).  MV: 11.4 L/min Temp (24hrs), Avg:98.8 F (37.1 C), Min:98.1 F (36.7 C), Max:99.3 F (37.4 C)   Intake/Output Summary (Last 24 hours) at 11/18/2021 1446 Last data filed at 11/18/2021 1400 Gross per 24 hour  Intake 2757.26 ml  Output 1576 ml  Net 1181.26 ml  Net IO Since Admission: 12,085.87 mL [11/18/21 1446]  Nutritionally Relevant Medications: Scheduled Meds:  docusate  100 mg Per Tube BID   VITAL HIGH PROTEIN  1,000 mL Per Tube Q24H   folic acid  1 mg Per Tube Daily   insulin aspart  0-9 Units Subcutaneous Q4H   multivitamin with minerals  1 tablet Per Tube Daily   pantoprazole sodium  40 mg Per Tube Daily   polyethylene glycol  17 g Per Tube Daily   thiamine (VITAMIN B1)  100 mg Intravenous Daily   Continuous Infusions:  feeding supplement (VITAL AF 1.2 CAL) 50 mL/hr at 11/18/21 0800   fentaNYL infusion INTRAVENOUS 100 mcg/hr (11/18/21 0800)   Labs Reviewed: Na 134 BUN 31, creatinine 2.92  Lab Results  Component Value Date   ALT 35 11/18/2021   AST 98 (H) 11/18/2021   ALKPHOS 280 (H) 11/18/2021   BILITOT 2.7 (H) 11/18/2021    NUTRITION - FOCUSED PHYSICAL EXAM: Flowsheet Row Most Recent Value  Orbital Region No depletion  Upper Arm Region No depletion  Thoracic and Lumbar Region No depletion  Buccal Region No depletion  Temple Region No depletion  Clavicle Bone Region No depletion  Clavicle and Acromion Bone Region No depletion  Scapular Bone Region No depletion  Dorsal Hand No depletion  Patellar Region No depletion  Anterior Thigh Region No depletion  Posterior Calf Region No depletion  Edema (RD Assessment) Moderate  [generalized]  Hair Reviewed  Eyes Unable to assess  Mouth Unable to assess  Skin Reviewed  Nails Reviewed    Diet Order:   Diet Order             Diet NPO time  specified Except for: Other (See Comments)  Diet effective now                   EDUCATION NEEDS:   Not appropriate for education at this time  Skin:  Skin Assessment: Reviewed RN Assessment  Last BM:  8/1 - type 7  Height:   Ht Readings from Last 1 Encounters:  11/13/21 5\' 7"  (1.702 m)    Weight:   Wt Readings from Last 1 Encounters:  11/18/21 98 kg    Ideal Body Weight:  61.4 kg  BMI:  Body mass index is 33.84 kg/m.  Estimated Nutritional Needs:   Kcal:  2000-2200  Protein:  100-115 grams  Fluid:  >/= 2 L    01/18/22, RD, LDN Clinical Dietitian RD pager # available in AMION  After hours/weekend pager # available in [facility]

## 2021-11-18 NOTE — Progress Notes (Signed)
Cortrak Tube Team Note:  Consult received to place a Cortrak feeding tube. Spoke with RN who reports plan to hold Cortrak placement today. Cortrak can be placed on Friday if needed.    Romelle Starcher MS, RDN, LDN, CNSC Registered Dietitian 3 Clinical Nutrition RD Pager and On-Call Pager Number Located in [place]

## 2021-11-18 NOTE — Progress Notes (Signed)
Scott, NP came by and instructed RN to stop fentanyl and to give IV potassium down NG tube and give lasix.

## 2021-11-18 NOTE — Progress Notes (Addendum)
Neurology Progress Note  Subjective: Her mom and husband are at the bedside. She is on Fentanyl @ . Sedation paused for exam. LTM discontinued today. No seizures identified. She does not open her eyes, does not follow commands. Family endorses that she has followed commands for them earlier. MRI brain to be obtained  Exam: Vitals:   11/18/21 1130 11/18/21 1200  BP:  (!) 115/104  Pulse: (!) 105 (!) 107  Resp: 12 15  Temp: 99.1 F (37.3 C) 99.1 F (37.3 C)  SpO2: 98% 100%   Gen: Intubated on sedation in the ICU. On Fentanyl gtt. Fentanyl paused for exam.  Resp: Oral ETT in place, midline mouth. Cough initiated with inline suctioning. Moderate amount of clear oral secretions present with oral suctioning.  Abd: soft, non-tender, non-distended  Neuro: MS: Sedated on Fentanyl @ , Paused for exam. No eye opening on exam, does not follow commands CN: Pupils reactive bilaterally/sluggish, corneals intact, cough and gag reflexes intact. Does not spontaneously open eyes, does not track examiner, doll's eyes intact Blinks to threat throughout.  Head appears midline.  Motor: withdraws from noxious stimuli, grimaces and squeezes eyes shut Sensory: As above Gait: Deferred for patient safety  Pertinent Labs: Creatinine 2.66 Lab Results  Component Value Date   WBC 9.3 11/18/2021   HGB 11.1 (L) 11/18/2021   HCT 33.9 (L) 11/18/2021   MCV 96.9 11/18/2021   PLT 102 (L) 11/18/2021   CMP     Component Value Date/Time   NA 134 (L) 11/18/2021 0538   K 3.5 11/18/2021 0538   CL 105 11/18/2021 0538   CO2 16 (L) 11/18/2021 0538   GLUCOSE 136 (H) 11/18/2021 0538   BUN 31 (H) 11/18/2021 0538   CREATININE 2.92 (H) 11/18/2021 0538   CALCIUM 8.9 11/18/2021 0538   PROT 5.9 (L) 11/18/2021 0538   ALBUMIN 2.2 (L) 11/18/2021 0538   AST 98 (H) 11/18/2021 0538   ALT 35 11/18/2021 0538   ALKPHOS 280 (H) 11/18/2021 0538   BILITOT 2.7 (H) 11/18/2021 0538   GFRNONAA 21 (L) 11/18/2021 0538    Imaging:  EEG overnight 7/31 - 8/1:  "This study is suggestive of moderate to severe diffuse encephalopathy, nonspecific etiology. No seizures or epileptiform discharges were seen throughout the recording."  EEG 8/1-8/2: This study is suggestive of moderate to severe diffuse encephalopathy, nonspecific etiology. No seizures or epileptiform discharges were seen throughout the recording  Impression: 36 year old female with a history of seizures who presented s/p cardiac arrest, presumably due to prolonged QTc. She was resumed on her home dose of Topamax, and a renal dose of gabapentin. There was initial concern for possible missed medication doses but family reported today that she has not missed any medications. Pharmacy on board to continue assistance with dosing of gabapentin as she fluctuates. EEG monitoring since admission without evidence of seizures or epileptiform discharges. Will continue to monitor as sedation is weaned.   Recommendations: 1) continue Topamax 100 mg twice daily 2) continue gabapentin (equivalent to 900 mg twice daily with normal renal function, pharmacy consult to adjust) 3) MRI brain wo 4) Neurology will continue to follow   Gevena Mart DNP, ACNPC-AG   Neurology Attending Attestation   I examined the patient and discussed plan with Ms. Artis Flock NP. Above note has been edited by me to reflect my findings and recommendations. Patient will undergo MRI brain this afternoon and we will f/u with family tomorrow to discuss the results.   Bing Neighbors, MD [facility] [phone]  If 7pm- 7am, please page neurology on call as listed in AMION.

## 2021-11-18 NOTE — Procedures (Addendum)
Patient Name: Brelyn Woehl  MRN: [id number]  Epilepsy Attending: Charlsie Quest  Referring Physician/Provider: Rejeana Brock, MD  Duration: 11/17/2021 7253 to 11/18/2021 1035   Patient history: 36yo F s/p cardiac arrest. EEG to evaluate for seizure.   Level of alertness: lethargic/sedated   AEDs during EEG study: TPM, GBP   Technical aspects: This EEG study was done with scalp electrodes positioned according to the 10-20 International system of electrode placement. Electrical activity was reviewed with band pass filter of 1-70Hz , sensitivity of 7 uV/mm, display speed of 38mm/sec with a 60Hz  notched filter applied as appropriate. EEG data were recorded continuously and digitally stored.  Video monitoring was available and reviewed as appropriate.   Description: EEG showed near continuous generalized 3-5Hz  theta-delta slowing admixed with 1 to 2 seconds of generalized EEG attenuation. Hyperventilation and photic stimulation were not performed.      ABNORMALITY - Continuous slow, generalized   IMPRESSION: This study is suggestive of moderate to severe diffuse encephalopathy, nonspecific etiology. No seizures or epileptiform discharges were seen throughout the recording.   Hawk Mones 

## 2021-11-18 NOTE — Progress Notes (Signed)
LTM EEG discontinued by MB - old and previously reported light skin breakdown at FP1, FP2, F7, and F8 after unhook noted. These electrode areas were previously re-located slightly to heal and appeared to be healing and scabbed over. No other skin breakdown issues noted.

## 2021-11-18 NOTE — Progress Notes (Signed)
Pt transported to and from MRI on the ventilator without complications. ?

## 2021-11-18 NOTE — Progress Notes (Signed)
Discussed with Lorin Picket, NP that patient gagged on ETT and then had emesis that was tube feeds therefore tube feeds stopped. Also discussed with NP that fentanyl was weaned down by night shift RN prior to shift change. NP stated to continue the fentanyl at current dose of 100 mcg/H during vent wean since patient has been gagging against tube. No further orders.

## 2021-11-19 DIAGNOSIS — G40909 Epilepsy, unspecified, not intractable, without status epilepticus: Secondary | ICD-10-CM

## 2021-11-19 DIAGNOSIS — G9340 Encephalopathy, unspecified: Secondary | ICD-10-CM | POA: Diagnosis not present

## 2021-11-19 DIAGNOSIS — N179 Acute kidney failure, unspecified: Secondary | ICD-10-CM | POA: Diagnosis not present

## 2021-11-19 DIAGNOSIS — J9601 Acute respiratory failure with hypoxia: Secondary | ICD-10-CM | POA: Diagnosis not present

## 2021-11-19 DIAGNOSIS — I469 Cardiac arrest, cause unspecified: Secondary | ICD-10-CM | POA: Diagnosis not present

## 2021-11-19 LAB — HEPATIC FUNCTION PANEL
ALT: 35 U/L (ref 0–44)
AST: 97 U/L — ABNORMAL HIGH (ref 15–41)
Albumin: 2.2 g/dL — ABNORMAL LOW (ref 3.5–5.0)
Alkaline Phosphatase: 323 U/L — ABNORMAL HIGH (ref 38–126)
Bilirubin, Direct: 1.4 mg/dL — ABNORMAL HIGH (ref 0.0–0.2)
Indirect Bilirubin: 1.2 mg/dL — ABNORMAL HIGH (ref 0.3–0.9)
Total Bilirubin: 2.6 mg/dL — ABNORMAL HIGH (ref 0.3–1.2)
Total Protein: 6.1 g/dL — ABNORMAL LOW (ref 6.5–8.1)

## 2021-11-19 LAB — BASIC METABOLIC PANEL
Anion gap: 12 (ref 5–15)
BUN: 40 mg/dL — ABNORMAL HIGH (ref 6–20)
CO2: 17 mmol/L — ABNORMAL LOW (ref 22–32)
Calcium: 9.2 mg/dL (ref 8.9–10.3)
Chloride: 107 mmol/L (ref 98–111)
Creatinine, Ser: 2.98 mg/dL — ABNORMAL HIGH (ref 0.44–1.00)
GFR, Estimated: 20 mL/min — ABNORMAL LOW (ref >60)
Glucose, Bld: 126 mg/dL — ABNORMAL HIGH (ref 70–99)
Potassium: 3.9 mmol/L (ref 3.5–5.1)
Sodium: 136 mmol/L (ref 135–145)

## 2021-11-19 LAB — CBC
HCT: 32.6 % — ABNORMAL LOW (ref 36.0–46.0)
Hemoglobin: 10.5 g/dL — ABNORMAL LOW (ref 12.0–15.0)
MCH: 31.7 pg (ref 26.0–34.0)
MCHC: 32.2 g/dL (ref 30.0–36.0)
MCV: 98.5 fL (ref 80.0–100.0)
Platelets: 116 10*3/uL — ABNORMAL LOW (ref 150–400)
RBC: 3.31 MIL/uL — ABNORMAL LOW (ref 3.87–5.11)
RDW: 15.4 % (ref 11.5–15.5)
WBC: 8.2 10*3/uL (ref 4.0–10.5)
nRBC: 0 % (ref 0.0–0.2)

## 2021-11-19 LAB — GLUCOSE, CAPILLARY
Glucose-Capillary: 128 mg/dL — ABNORMAL HIGH (ref 70–99)
Glucose-Capillary: 122 mg/dL — ABNORMAL HIGH (ref 70–99)
Glucose-Capillary: 121 mg/dL — ABNORMAL HIGH (ref 70–99)
Glucose-Capillary: 133 mg/dL — ABNORMAL HIGH (ref 70–99)
Glucose-Capillary: 106 mg/dL — ABNORMAL HIGH (ref 70–99)

## 2021-11-19 LAB — PHOSPHORUS: Phosphorus: 3.5 mg/dL (ref 2.5–4.6)

## 2021-11-19 LAB — MAGNESIUM: Magnesium: 2.3 mg/dL (ref 1.7–2.4)

## 2021-11-19 MED ORDER — HYDROMORPHONE HCL 1 MG/ML IJ SOLN
INTRAMUSCULAR | Status: AC
  Administered 2021-11-20: 0.5 mg
  Filled 2021-11-19: qty 0.5

## 2021-11-19 MED ORDER — LACOSAMIDE 200 MG/20ML IV SOLN
100.0000 mg | Freq: Two times a day (BID) | INTRAVENOUS | Status: DC
  Administered 2021-11-19 – 2021-11-26 (×14): 100 mg via INTRAVENOUS
  Filled 2021-11-19 (×16): qty 10

## 2021-11-19 MED ORDER — HYDROMORPHONE HCL 1 MG/ML IJ SOLN
0.2500 mg | Freq: Once | INTRAMUSCULAR | Status: AC
  Administered 2021-11-19: 0.25 mg via INTRAVENOUS

## 2021-11-19 MED ORDER — LACOSAMIDE 200 MG/20ML IV SOLN
200.0000 mg | Freq: Once | INTRAVENOUS | Status: AC
Start: 2021-11-19 — End: 2021-11-19
  Administered 2021-11-19: 200 mg via INTRAVENOUS
  Filled 2021-11-19: qty 20

## 2021-11-19 MED ORDER — ACETAMINOPHEN 650 MG RE SUPP
650.0000 mg | Freq: Three times a day (TID) | RECTAL | Status: DC | PRN
Start: 2021-11-19 — End: 2021-11-20
  Administered 2021-11-19 – 2021-11-20 (×2): 650 mg via RECTAL
  Filled 2021-11-19 (×2): qty 1

## 2021-11-19 MED ORDER — POTASSIUM CHLORIDE 20 MEQ PO PACK
40.0000 meq | PACK | Freq: Once | ORAL | Status: AC
  Administered 2021-11-19: 40 meq
  Filled 2021-11-19: qty 2

## 2021-11-19 MED ORDER — HYDROMORPHONE HCL 1 MG/ML IJ SOLN
0.2500 mg | Freq: Four times a day (QID) | INTRAMUSCULAR | Status: DC | PRN
  Administered 2021-11-19: 0.25 mg via INTRAVENOUS
  Filled 2021-11-19 (×2): qty 0.5

## 2021-11-19 MED ORDER — DEXAMETHASONE SODIUM PHOSPHATE 4 MG/ML IJ SOLN
4.0000 mg | Freq: Four times a day (QID) | INTRAMUSCULAR | Status: AC
Start: 2021-11-19 — End: 2021-11-20
  Administered 2021-11-19 – 2021-11-20 (×4): 4 mg via INTRAVENOUS
  Filled 2021-11-19 (×4): qty 1

## 2021-11-19 MED ORDER — HYDROMORPHONE HCL 1 MG/ML IJ SOLN
0.2500 mg | INTRAMUSCULAR | Status: DC | PRN
  Administered 2021-11-19 – 2021-11-21 (×6): 0.25 mg via INTRAVENOUS
  Filled 2021-11-19 (×6): qty 0.5

## 2021-11-19 NOTE — Progress Notes (Signed)
SLP Cancellation Note  Patient Details Name: Kara [name] MRN: [id number] DOB: [birth year]   Cancelled treatment:       Reason Eval/Treat Not Completed: Patient not medically ready. SLP spoke with patient's RN who reported that patient is not currently able to participate and isn't following commands well enough. SLP will f/u next date for patient readiness.   Angela Nevin, MA, CCC-SLP Speech Therapy

## 2021-11-19 NOTE — Progress Notes (Signed)
eLink Physician-Brief Progress Note Patient Name: Kara [name] DOB: May 28, 1985 MRN: [id number]   Date of Service  11/19/2021  HPI/Events of Note  K+ 3.3, Cr 2.98  eICU Interventions  KCL 40 meq via enteral tube x 1 ordered.        Thomasene Lot Nicolaos Mitrano 11/19/2021, 12:54 AM

## 2021-11-19 NOTE — Progress Notes (Signed)
   11/19/21 1515  Clinical Encounter Type  Visited With Family  Visit Type Critical Care;Spiritual support;Initial  Referral From Physician (Dr. Bing Neighbors, MD)  Consult/Referral To Chaplain Benetta Spar)  Recommendations Family Support  Spiritual Encounters  Spiritual Needs Emotional;Grief support;Prayer  Stress Factors  Family Stress Factors Exhausted;Health changes;Loss   Spiritual Care Consultation Request by Dr. Selina Cooley for family support.  This Chaplain had opportunity to meet with Marisabel's mother, Steward Drone and Amands's husband, [name]. Family has been dealing with many recent losses - Death of [name]'s husband, her mother, and most recently, her grandson from fentanyl overdose just two weeks ago.   I was able to provide space for Steward Drone to reflect and share her sadness and grief. We discussed her health and support system as well. Unfortunately, neither Steward Drone, nor Harrold Donath have family or friends close by.....  Harrold Donath and Steward Drone seem to be finding comfort and support from one another...  I have made them aware of Spiritual Care support available 24x7, and will continue follow up with them. [address] [place], Aletha Halim., [phone]

## 2021-11-19 NOTE — Progress Notes (Signed)
eLink Physician-Brief Progress Note Patient Name: Kara [name] DOB: 08/27/1985 MRN: [id number]   Date of Service  11/19/2021  HPI/Events of Note  Nursing reports bouts of severe agitation. Apparently on chronic Oxycodone  IR at home. Element of Opiod withdrawal? She was given Dilaudid 0.25 mg IV at about 5 PM with improvement.  eICU Interventions  Plan: Increase Dilaudid dose to 0.25 mg Q 4 hours PRN pain.     Intervention Category Major Interventions: Delirium, psychosis, severe agitation - evaluation and management  Kara [name] 11/19/2021, 10:08 PM

## 2021-11-19 NOTE — Procedures (Signed)
Extubation Procedure Note  Patient Details:   Name: Kara [name] DOB: 1986/01/07 MRN: [id number]   Airway Documentation:    Vent end date: 11/19/21 Vent end time: 0845   Evaluation  O2 sats: stable throughout Complications: No apparent complications Patient did tolerate procedure well. Bilateral Breath Sounds: Rhonchi, Diminished   Yes  Patient was extubated to a 4L [place] without any complications, dyspnea or stridor noted. Positive cuff leak prior to extubation.   Kara [name], Margaretmary Dys 11/19/2021, 8:45 AM

## 2021-11-19 NOTE — Progress Notes (Signed)
Notified Gweneth Fritter, NP that patient is very agitated trying to sit up in bed, restless and moaning out, appears in pain per assessment and patient's mother. NP came to bedside and saw patient and placed order for PRN pain medication. Patient repositioned and peri/foley care performed.

## 2021-11-19 NOTE — Progress Notes (Signed)
Extubated to 4 L nasal cannula by Morrie Sheldon, RT. 93 % o2 sat at this time. Patient not following commands. Resting comfortably with even non-labored respirations. Patient's mother and husband at bedside.

## 2021-11-19 NOTE — Progress Notes (Signed)
Agitation decreased after giving dilaudid. Patient resting in bed. Heart rate down to 1 teens and oxygen sat mid to upper 90's.

## 2021-11-19 NOTE — Progress Notes (Signed)
Notified Gweneth Fritter, NP that patient is agitated again, writhing in bed trying to get up and appears in pain, tachycardic rate 140's. NP came to bedside and stated he would order a one time dose of dilaudid since it did help when given around 1605. Patient still not following commands.

## 2021-11-19 NOTE — Progress Notes (Signed)
NAME:  Kara [name], MRN:  [id number], DOB:  24-Dec-1985, LOS: 6 ADMISSION DATE:  11/13/2021, CONSULTATION DATE:  7/28 REFERRING MD:  Dr. 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  DVT ultrasound negative.  Patient discharged with ABX on 11/07/2021.  During hospital course patient with low K and mag.  During the past few days per husband patient has been nauseous and not able to take her medications.  She has been taking medication for nausea.  On 7/28 in the afternoon, patient's husband states he went out and about 20 minutes later found patient down on floor with agonal respirations.  He called 911 and started CPR.  Fire dept/EMS arrived finding patient to be in VT.  Multiple shocks delivered, epi and amnio given.  About 15 minutes until ROSC was obtained by EMS arrival.  Patient intubated and was given sedation.  Patient transferred to [facility] ED.  Upon arrival to [facility] on 7/28, patient intubated and sedated.  Patient on epi drip.  EKG with no ST elevation but prolonged QTc.  Cardiology consulted.  CXR with no signs of pneumonia; ETT in good position.  Pertinent ED labs: UDS WNL, UA WNL, WBC 23.9, K2.7, glucose 290, AST 178, alk phos 321, troponin 111, ABG 7.24, 45, 156, 19.  CT head and PE chest pending.  Other labs and cultures pending.  Patient noted to be having some posturing and possible myoclonic activity.  Versed given.  EEG ordered.  PCCM consulted for ICU admission.   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 MRI  Interim History / Subjective:  Tmax 99.5  No acute events overnight.   X4 stool, 1.5 L of urine output, 1 episode of emesis  100 fentanyl   Unable to obtain subjective evaluation due to patient status   Objective   Blood pressure 118/81, pulse 91, temperature 98.6 F (37 C), resp. rate (!) 28, height 5' 7" (1.702 m), weight 89.6 kg, SpO2 98 %.    Vent Mode: PRVC FiO2 (%):  [40 %] 40 % Set Rate:  [28 bmp] 28 bmp Vt Set:  [490 mL] 490 mL PEEP:  [5 cmH20] 5 cmH20 Pressure Support:  [5 cmH20] 5 cmH20 Plateau Pressure:  [21 cmH20] 21 cmH20   Intake/Output Summary (Last 24 hours) at 11/19/2021 3536 Last data filed at 11/19/2021 0600 Gross per 24 hour  Intake 1703.01 ml  Output 1530 ml  Net 173.01 ml   Filed Weights   11/16/21 0400 11/18/21 0400 11/19/21 0334  Weight: 93.3 kg 98 kg 89.6 kg    Examination: General: In bed, NAD, appears comfortable HEENT: MM pink/moist, anicteric, atraumatic Neuro: RASS -1, PERRL 15m, GCS 11t, wiggles toes to command CV: S1S2, ST, no m/r/g appreciated PULM:  air movement in all lobes, trachea midline, chest expansion symmetric GI: soft, bsx4 active, non-tender   Extremities: warm/dry, no pretibial edema, capillary refill less than 3  seconds  Skin:  no rashes or lesions noted  Labs/imaging Resp culture: NG final BC NGTD day 5 final BG 97-156 PLT 127>79>102>116 HGB 11.1>10.5 K 3.3>3.9 CO2 16>15>17 BUN 39>40, Creat 2.98 AST 98>97, ALK phos 280>323, bili 2.7>2.6  MRI: Per radiology: Restricted diffusion in the bilateral posterior frontal, parietal, and superior occipital cortex, concerning for cytotoxic edema from hypoxic ischemic injury. EEG: No seizures or epileptiform discharges.  Resolved Hospital Problem list   Hx of splenic vein thrombosis:  previously on warfarin complicated by abdominal hematoma requiring IR intervention, not presently on warfarin   Assessment & Plan:  VT/Vfib Cardiac arrest: patient unseen for about 20 minutes; found down pulseless; found to be in VT shocked multiple times, given epi and amio; ROSC 15 after EMS arrived to seen. Per husband has been having N/V taking anti-nausea meds. Not taking her home meds that husband was aware of that past few days due to this.  UDS neg, alcohol neg. Hx of long qt syndrome Hypokalemia -Continue normothermia protocol -Avoid QT prolonging agents -Goal potassium greater than 4, magnesium greater than 2. 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  MRI concerning for restricted diffusion in the bilateral posterior frontal, parietal, and superior occipital cortex, concerning for cytotoxic edema from hypoxic ischemic injury. -Continue Topamax 100 mg twice daily -Continue gabapentin pharmacy -Appreciate neurology assistance  Acute respiratory failure with hypoxia due cardiac arrest Hx of asthma: On Breo-Ellipta, abuterol, and singulair, no bronchospasm on vent Patient tolerating SBT's. -LTVV strategy with tidal volumes of 4-8 cc/kg ideal body weight -Goal plateau pressures less than 30 and driving pressures less than 15 -Wean PEEP/FiO2 for SpO2 92-98% -VAP bundle -Daily SAT and SBT.  Weaned on pressure support on 8/2 for most of day.  Hope to extubate today -PAD bundle with fentanyl gtt and per tube oxycodone. Hold fentanyl gtt for extubation. -RASS goal 0 to -1 -Follow intermittent CXR and ABG PRN -Continue scheduled Pulmicort and Brovana, as needed albuterol  Leukocytosis-improving Recent cellulitis RLE: discharged 11/07/2021 on doxy x 7 days but unable to take meds due to N/V Aspiration PNA WBC  11.0 > 9.3> 8.2.  Rocephin completed 8/1.  Tmax 99.5.  No growth on all cultures. O2 requirement remains at 40% -Monitor fever/WBC curve  Hx of chronic diastolic HF: last echo 2018 EF 60-65% Likely Type II NSTEMI -Continue telemetry  Hx of anxiety: On quetiapine, topiramate, sertraline History of alcohol use Chronic pain: on oxy at home -Continue thiamine multivitamin folate -Continue to monitor for signs of alcohol withdrawal -Continue prior to oxy scheduled 10 mg 3 times daily -QTc monitoring as above  HTN/HLD -Continue holding home spironolactone/Crestor  Hx of pancreatitis Recurrent emesis Choledocholithiasis as seen on [place] on ABD on 7/28 Shock liver: Total Bili , Alk phos , LFT's elevated,- improving GERD Lipase 67.  Recurrent emesis.  Now having multiple bowel movements status post enema.  Suspect emesis secondary to intolerance of endotracheal intubation and inability to give Creon. AST 98>97, ALK phos 280>323, bili 2.7>2.6 -Continue tube feeds -Continue PPI -Creon on hold -Continue MiraLAX and docusate  NAGMA CO2 16>15>17. X4 BM, 1 emesis -monitor, supportive care  Post arrest acute kidney injury- due to hypotension/ischemic ATN 1.5 L of urine output status post 40 of Lasix.   3.0 > 2.92 > 2.8>2.8 -Ensure renal perfusion. Goal MAP 65 or greater. -Avoid  neprotoxic drugs as possible. -Strict I&O's -Follow up AM creatinine -Holding on further diuresis today with plateau of creatinine   Thrombocytopenia Platelets 127 > 79 > 102> 116, suspect secondary to critical illness -Monitor on a.m. CBC  Nutrition -Continue tube feeds  Consulting services: wound care, neuro, cardiology  Best Practice (right click and "Reselect all SmartList Selections" daily)   Diet/type: TF DVT prophylaxis: Heparin GI prophylaxis: PPI Lines: n/A Foley: yes Code Status:  full code Last date of multidisciplinary goals of care discussion [8/3 husband and mother updated at  bedside.]  Critical care time: 21 minutes  Gershon Mussel., MSN, APRN, AGACNP-BC [facility]  11/19/2021 , 6:32 AM  Please see [email or web address] for pager details  If no response, please call [phone] After hours, please call Elink at [phone]

## 2021-11-19 NOTE — Progress Notes (Signed)
Neurology Progress Note  Subjective: Her mom and husband are at the bedside. Patient was extubated today, remains v lethargic, will stir in response to sternal rub but remains nonverbal. Will not follow commands for me today, although family said she did for them earlier.   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 Subcortical nuclei are spared. Personally reviewed.  Exam: Vitals:   11/19/21 1704 11/19/21 1745  BP: 125/83   Pulse: (!) 121   Resp: (!) 26 (!) 33  Temp: 98.6 F (37 C)   SpO2: 98%    Gen: Extubated today, somnolent, does not answer orientation questions or follow commands Resp: mild-to-mod stridor CV: RRR  Neuro: MS: Extubated today, somnolent, does not answer orientation questions or follow commands CN: PERRL, (+) corneals, oculocephalics, cough, gag, does not track examiner or respond to loud voice: Speech: no intelligible speech Motor: withdraws in response to noxious stimuli BUE, grimaces in response to noxious stimuli BLE Reflexes: 1+ symm throughout, toes not tested 2/2 boots  Pertinent Labs: Creatinine 2.66 Lab Results  Component Value Date   WBC 8.2 11/19/2021   HGB 10.5 (L) 11/19/2021   HCT 32.6 (L) 11/19/2021   MCV 98.5 11/19/2021   PLT 116 (L) 11/19/2021   CMP     Component Value Date/Time   NA 136 11/19/2021 0547   K 3.9 11/19/2021 0547   CL 107 11/19/2021 0547   CO2 17 (L) 11/19/2021 0547   GLUCOSE 126 (H) 11/19/2021 0547   BUN 40 (H) 11/19/2021 0547   CREATININE 2.98 (H) 11/19/2021 0547   CALCIUM 9.2 11/19/2021 0547   PROT 6.1 (L) 11/19/2021 0547   ALBUMIN 2.2 (L) 11/19/2021 0547   AST 97 (H) 11/19/2021 0547   ALT 35 11/19/2021 0547   ALKPHOS 323 (H) 11/19/2021 0547   BILITOT 2.6 (H) 11/19/2021 0547   GFRNONAA 20 (L) 11/19/2021 0547   Imaging:  EEG overnight 7/31 - 8/1:  "This study is suggestive  of moderate to severe diffuse encephalopathy, nonspecific etiology. No seizures or epileptiform discharges were seen throughout the recording."  EEG 8/1-8/2: This study is suggestive of moderate to severe diffuse encephalopathy, nonspecific etiology. No seizures or epileptiform discharges were seen throughout the recording  Impression: 36 year old female with a history of seizures who presented s/p cardiac arrest, presumably due to prolonged QTc. She was resumed on her home dose of Topamax, and a renal dose of gabapentin. There was initial concern for possible missed medication doses but family reported today that she has not missed any medications. Pharmacy on board to continue assistance with dosing of gabapentin as she fluctuates. EEG monitoring 7/31-8/2 without evidence of seizures or epileptiform discharges. She was extubated 8/3 although mental status remains poor. She did not interact with me at all this afternoon, although per family she seemed to engage a few times earlier (smiled twice when they told her a story).   I had a very long conversation with mother and husband at bedside.  I explained to them that her MRI did show evidence of hypoxic ischemic injury and that in all likelihood [place] would not return to her former full cognitive and neurologic function.  Unfortunately at this point we cannot say how much she will recover, nor how long will take.  Her youth is in her favor.  We will begin rehabilitation as soon as she is able to participate.  Dr. Merrily Pew and I  both counseled the family that it will be months before we really have an idea of what her recovery and functional outcome will look like.  Family understandably very distraught, particularly her mother who does not live locally and has little support system other than the patient's husband who is also going through his own grieving process.  With her permission I did place a consult to the chaplain for spiritual comfort and assistance  with coping mechanisms.  She is currently unable to take p.o. safely.  She is on gabapentin for neuropathy which has sent antiseizure qualities although is not a powerful AED.  We have held this today in hopes that this will improve her mental status.  She is unable to take topiramate as there is no IV equivalent.  She has not tolerated Keppra in the past.  Therefore we we will hold the topiramate and load with Vimpat and continue 100 mg twice daily.  If she tolerates this well with no breakthrough seizures or side effects then we will plan to continue this going forward and not restart the topiramate.  Recommendations: 1) hold gabapentin to avoid sedation 2) d/c topiramate 3) Load vimpat 200mg  IV once f/b 100mg  IV q 12 hrs 4) Consult to spiritual care / Chaplain 4) Neurology will continue to follow   This patient is critically ill and at significant risk of neurological worsening, death and care requires constant monitoring of vital signs, hemodynamics,respiratory and cardiac monitoring, neurological assessment, discussion with family, other specialists and medical decision making of high complexity. I spent 75 minutes of neurocritical care time  in the care of  this patient. This was time spent independent of any time provided by nurse practitioner or PA.  , MD [facility] [phone]  If 7pm- 7am, please page neurology on call as listed in AMION.

## 2021-11-20 ENCOUNTER — Encounter (HOSPITAL_COMMUNITY): Payer: Self-pay | Admitting: Pulmonary Disease

## 2021-11-20 ENCOUNTER — Inpatient Hospital Stay (HOSPITAL_COMMUNITY): Payer: BC Managed Care – PPO

## 2021-11-20 DIAGNOSIS — I469 Cardiac arrest, cause unspecified: Secondary | ICD-10-CM | POA: Diagnosis not present

## 2021-11-20 DIAGNOSIS — G9340 Encephalopathy, unspecified: Secondary | ICD-10-CM | POA: Diagnosis not present

## 2021-11-20 DIAGNOSIS — R9431 Abnormal electrocardiogram [ECG] [EKG]: Secondary | ICD-10-CM | POA: Diagnosis not present

## 2021-11-20 DIAGNOSIS — R569 Unspecified convulsions: Secondary | ICD-10-CM | POA: Diagnosis not present

## 2021-11-20 DIAGNOSIS — E872 Acidosis, unspecified: Secondary | ICD-10-CM | POA: Diagnosis not present

## 2021-11-20 DIAGNOSIS — E876 Hypokalemia: Secondary | ICD-10-CM | POA: Diagnosis not present

## 2021-11-20 DIAGNOSIS — G931 Anoxic brain damage, not elsewhere classified: Secondary | ICD-10-CM

## 2021-11-20 LAB — BASIC METABOLIC PANEL
Anion gap: 20 — ABNORMAL HIGH (ref 5–15)
BUN: 47 mg/dL — ABNORMAL HIGH (ref 6–20)
CO2: 11 mmol/L — ABNORMAL LOW (ref 22–32)
Calcium: 9.5 mg/dL (ref 8.9–10.3)
Chloride: 108 mmol/L (ref 98–111)
Creatinine, Ser: 3.48 mg/dL — ABNORMAL HIGH (ref 0.44–1.00)
GFR, Estimated: 17 mL/min — ABNORMAL LOW (ref >60)
Glucose, Bld: 124 mg/dL — ABNORMAL HIGH (ref 70–99)
Potassium: 3.6 mmol/L (ref 3.5–5.1)
Sodium: 139 mmol/L (ref 135–145)

## 2021-11-20 LAB — GLUCOSE, CAPILLARY
Glucose-Capillary: 104 mg/dL — ABNORMAL HIGH (ref 70–99)
Glucose-Capillary: 106 mg/dL — ABNORMAL HIGH (ref 70–99)
Glucose-Capillary: 118 mg/dL — ABNORMAL HIGH (ref 70–99)
Glucose-Capillary: 124 mg/dL — ABNORMAL HIGH (ref 70–99)
Glucose-Capillary: 124 mg/dL — ABNORMAL HIGH (ref 70–99)
Glucose-Capillary: 128 mg/dL — ABNORMAL HIGH (ref 70–99)
Glucose-Capillary: 90 mg/dL (ref 70–99)

## 2021-11-20 LAB — MAGNESIUM: Magnesium: 2.4 mg/dL (ref 1.7–2.4)

## 2021-11-20 LAB — HEPATIC FUNCTION PANEL
ALT: 38 U/L (ref 0–44)
AST: 91 U/L — ABNORMAL HIGH (ref 15–41)
Albumin: 2.4 g/dL — ABNORMAL LOW (ref 3.5–5.0)
Alkaline Phosphatase: 397 U/L — ABNORMAL HIGH (ref 38–126)
Bilirubin, Direct: 1.3 mg/dL — ABNORMAL HIGH (ref 0.0–0.2)
Indirect Bilirubin: 1.8 mg/dL — ABNORMAL HIGH (ref 0.3–0.9)
Total Bilirubin: 3.1 mg/dL — ABNORMAL HIGH (ref 0.3–1.2)
Total Protein: 6.5 g/dL (ref 6.5–8.1)

## 2021-11-20 LAB — CBC
HCT: 33.9 % — ABNORMAL LOW (ref 36.0–46.0)
Hemoglobin: 10.8 g/dL — ABNORMAL LOW (ref 12.0–15.0)
MCH: 32.4 pg (ref 26.0–34.0)
MCHC: 31.9 g/dL (ref 30.0–36.0)
MCV: 101.8 fL — ABNORMAL HIGH (ref 80.0–100.0)
Platelets: 143 10*3/uL — ABNORMAL LOW (ref 150–400)
RBC: 3.33 MIL/uL — ABNORMAL LOW (ref 3.87–5.11)
RDW: 15.3 % (ref 11.5–15.5)
WBC: 12.8 10*3/uL — ABNORMAL HIGH (ref 4.0–10.5)
nRBC: 0 % (ref 0.0–0.2)

## 2021-11-20 LAB — PHOSPHORUS: Phosphorus: 4.4 mg/dL (ref 2.5–4.6)

## 2021-11-20 MED ORDER — ACETAMINOPHEN 160 MG/5ML PO SOLN
650.0000 mg | Freq: Three times a day (TID) | ORAL | Status: DC | PRN
  Administered 2021-11-21 – 2021-11-23 (×7): 650 mg
  Filled 2021-11-20 (×6): qty 20.3

## 2021-11-20 MED ORDER — VITAL 1.5 CAL PO LIQD
1000.0000 mL | ORAL | Status: DC
  Administered 2021-11-20: 1000 mL

## 2021-11-20 MED ORDER — LACTATED RINGERS IV SOLN
INTRAVENOUS | Status: DC
Start: 2021-11-20 — End: 2021-11-22

## 2021-11-20 MED ORDER — DEXMEDETOMIDINE HCL IN NACL 400 MCG/100ML IV SOLN
0.0000 ug/kg/h | INTRAVENOUS | Status: DC
  Administered 2021-11-20: 0.4 ug/kg/h via INTRAVENOUS
  Administered 2021-11-20: 0.5 ug/kg/h via INTRAVENOUS
  Administered 2021-11-21 (×2): 0.6 ug/kg/h via INTRAVENOUS
  Filled 2021-11-20 (×5): qty 100

## 2021-11-20 MED ORDER — GABAPENTIN 250 MG/5ML PO SOLN
300.0000 mg | Freq: Two times a day (BID) | ORAL | Status: DC
  Administered 2021-11-20 – 2021-11-23 (×6): 300 mg
  Filled 2021-11-20 (×7): qty 6

## 2021-11-20 MED ORDER — NOREPINEPHRINE 4 MG/250ML-% IV SOLN
INTRAVENOUS | Status: AC
  Filled 2021-11-20: qty 250

## 2021-11-20 NOTE — Progress Notes (Addendum)
Nutrition Follow-up  DOCUMENTATION CODES:   Not applicable  INTERVENTION:   Tube Feeding via Cortrak: Vital 1.5 at 60 ml/hr Begin TF at 20 ml/hr given previous episodes of vomiting, TF intolerance. Titrate by 10 mL q 8 hours until goal rate of 60 ml/hr Pro-Source TF 45 mL BID This provides 119 g of protein, 2240 kcals and 1094 mL of free water  No PERT therapy at this time as Creon not recommended to be administered via small bore feeding tube due to risk of clogging. Recommend restarting Creon as soon as pt able to take meds by mouth if on TF and/or oral diet  Monitor closely for signs of malabsorption (given hx of chronic pancreatitis requiring PERT) which include steatorrhea (pale, oily, foul smelling poop), worsening diarrhea-especially large volumes of stool, abdominal distention/discomfort, N/V.   If pt continues to have difficulty tolerating EN over the next 24-48 hours, recommend initiation of TPN.    NUTRITION DIAGNOSIS:   Inadequate oral intake related to inability to eat as evidenced by NPO status.  Being addressed via TF   GOAL:   Patient will meet greater than or equal to 90% of their needs  Progressing  MONITOR:   Vent status, Labs, I & O's, TF tolerance  REASON FOR ASSESSMENT:   Consult, Ventilator Assessment of nutrition requirement/status  ASSESSMENT:   36 y.o. female presented to the ED after cardiac arrest. Pt recently discharged on 7/22 from [facility] for RLE cellulitis. PMH includes HLD, GERD, CHF, EtOH abuse, HTN, and seizures. Pt admitted with V. Fib cardiac arrest, acute respiratory failure, and acute encephalopathy.  8/03 Extubated 8/04 Cortrak placed  Pt on precedex gtt.Pt agitated at times. Noted per neurology, MRI did show evidence of hypoxic ischemic injury but degree of neurological recovery is unknown at this time.   NPO, Cortrak placed. Pt with difficulty tolerating TF while on vent with multiple episodes of emesis.   SLP following but pt  not appropriate for diet advancement at this time  Pt with hx of long QT syndrome, reglan is not option, if pt continues with nausea/vomiting post TF reinitiation  Labs: BUN 47, Creatinine 3.48 Meds: folic acid, ss novolog, colace, MVI with Minerals, thiamine   Diet Order:   Diet Order             Diet NPO time specified Except for: Other (See Comments)  Diet effective now                   EDUCATION NEEDS:   Not appropriate for education at this time  Skin:  Skin Assessment: Reviewed RN Assessment  Last BM:  8/4  Height:   Ht Readings from Last 1 Encounters:  11/13/21 5\' 7"  (1.702 m)     Weight:   Wt Readings from Last 1 Encounters:  11/20/21 96.6 kg    Ideal Body Weight:  61.4 kg  BMI:  Body mass index is 33.35 kg/m.  Estimated Nutritional Needs:   Kcal:  2000-2200  Protein:  100-115 grams  Fluid:  >/= 2 L    01/20/22 MS, RDN, LDN, CNSC Registered Dietitian 3 Clinical Nutrition RD Pager and On-Call Pager Number Located in [place]

## 2021-11-20 NOTE — Procedures (Signed)
Cortrak  Person Inserting Tube:  Dalilah Curlin L, RD Tube Type:  Cortrak - 43 inches Tube Size:  10 Tube Location:  Left nare Secured by: Bridle Technique Used to Measure Tube Placement:  Marking at nare/corner of mouth Cortrak Secured At:  60 cm   Cortrak Tube Team Note:  Consult received to place a Cortrak feeding tube.   X-ray is required, abdominal x-ray has been ordered by the Cortrak team. Please confirm tube placement before using the Cortrak tube.   If the tube becomes dislodged please keep the tube and contact the Cortrak team at [email or web address] ([email or web address]) for replacement.  If after hours and replacement cannot be delayed, place a NG tube and confirm placement with an abdominal x-ray.    Kara [name] RD, LDN Clinical Dietitian See AMiON for contact information.    

## 2021-11-20 NOTE — Evaluation (Signed)
Physical Therapy Evaluation Patient Details Name: Kara [name] MRN: [id number] DOB: 09/29/1985 Today's Date: 11/20/2021  History of Present Illness  Pt is a 36 y.o. female admitted 11/13/21 with v-fib/VT cardiac arrest, some posturing and possible myoclonic activity; UDS (-), EEG (-). XBM:[id number] diffusion in bil posterior frontal, parietal, and superior occipital cortex, concerning for cytotoxic edema from hypoxic ischemic injury. ETT 7/28-8/3. PMH includes HF, HLD, seizure, ETOH use, asthma, anxiety, chronic pain, long QT syndrome.  Clinical Impression  Pt with eyes closed throughout session, no command following noted and minimal response visually when eye lids forcefully opened. Pt able to move RUE to respond to noxious stimuli but not consistent with all noxious stimuli. Pt with engagement of core to assist with rolling and rise to sitting from supine. Pt appears more comfortable and calm with sitting EOB and in full chair position in bed. Pt with decreased mobility, cognition, balance and function who will benefit from acute therapy to maximize mobility, safety and function to decrease burden of care.   4L at 94% HR 102 BP 91/54       Recommendations for follow up therapy are one component of a multi-disciplinary discharge planning process, led by the attending physician.  Recommendations may be updated based on patient status, additional functional criteria and insurance authorization.  Follow Up Recommendations Skilled nursing-short term rehab (<3 hours/day) Can patient physically be transported by private vehicle: No    Assistance Recommended at Discharge Frequent or constant Supervision/Assistance  Patient can return home with the following  Two people to help with walking and/or transfers;Two people to help with bathing/dressing/bathroom;Assistance with cooking/housework;Assist for transportation;Direct supervision/assist for medications management    Equipment Recommendations  Wheelchair (measurements PT);Hospital bed  Recommendations for Other Services       Functional Status Assessment Patient has had a recent decline in their functional status and/or demonstrates limited ability to make significant improvements in function in a reasonable and predictable amount of time     Precautions / Restrictions Precautions Precautions: Fall Precaution Comments: Watch HR and O2 sats, Cortrak. Restrictions Weight Bearing Restrictions: No      Mobility  Bed Mobility Overal bed mobility: Needs Assistance Bed Mobility: Rolling, Supine to Sit, Sit to Supine Rolling: Mod assist   Supine to sit: Max assist, +2 for physical assistance Sit to supine: Total assist, +2 for physical assistance   General bed mobility comments: Patient not alert or necessarily following commands, but was able to use therapist initiated momentum to assist with rolling bil side to side. Pt engaging core to assist with transition form supine to sit. Return to supine pt resisting and trying to return to sitting. Sat again then required total +2 to return to supine and slide toward HOB. EOB grossly 10 min with min assist for balance    Transfers                   General transfer comment: not yet ready to attempt    Ambulation/Gait                  Stairs            Wheelchair Mobility    Modified Rankin (Stroke Patients Only)       Balance Overall balance assessment: Needs assistance Sitting-balance support: Feet unsupported Sitting balance-Leahy Scale: Poor Sitting balance - Comments: min assist 10 min EOB Postural control: Posterior lean, Right lateral lean  Pertinent Vitals/Pain Pain Assessment Pain Assessment: CPOT Facial Expression: Tense Body Movements: Protection Muscle Tension: Relaxed Compliance with ventilator (intubated pts.): N/A Vocalization (extubated pts.): Talking in normal tone or no  sound CPOT Total: 2 Pain Intervention(s): Repositioned    Home Living Family/patient expects to be discharged to:: Private residence Living Arrangements: Spouse/significant other Available Help at Discharge: Family;Available 24 hours/day Type of Home: House Home Access: Stairs to enter   [employer] of Steps: 1   Home Layout: Two level;Able to live on main level with bedroom/bathroom Home Equipment: Rolling Walker (2 wheels) Additional Comments: was as [facility] for knee hematoma Wed-Sat 7/12-7/15, works as a Associate Professor at [employer]    Prior Function Prior Level of Function : Independent/Modified Independent;Working/employed;Driving                     Hand Dominance   Dominant Hand: Right    Extremity/Trunk Assessment   Upper Extremity Assessment Upper Extremity Assessment: Difficult to assess due to impaired cognition RUE Deficits / Details: ocassional nonpurposeful movement noted - did squeeze finger of OT times one LUE Deficits / Details: ocassional nonpurposeful movement noted    Lower Extremity Assessment Lower Extremity Assessment: Difficult to assess due to impaired cognition;RLE deficits/detail;LLE deficits/detail RLE Deficits / Details: pt with automatic movement of bil LE with pt achieving at least 90/90 hip and knee flexion for sitting but unable to follow commands for movement, keeping plantar flexion with sitting LLE Deficits / Details: pt with automatic movement of bil LE with pt achieving at least 90/90 hip and knee flexion for sitting but unable to follow commands for movement, keeping plantar flexion with sitting    Cervical / Trunk Assessment Cervical / Trunk Assessment: Other exceptions Cervical / Trunk Exceptions: rounded shoulders  Communication   Communication: Expressive difficulties  Cognition Arousal/Alertness: Lethargic Behavior During Therapy: Flat affect, Restless Overall Cognitive Status: Impaired/Different from baseline Area of  Impairment: Attention                               General Comments: pt lethargic and unable to open eyes to command or stimulation. pt moving RUE toward noxious stimuli of sternal rub and not resiting mobility. No direct command following        General Comments      Exercises     Assessment/Plan    PT Assessment Patient needs continued PT services  PT Problem List Decreased strength;Decreased mobility;Decreased safety awareness;Decreased activity tolerance;Decreased cognition;Decreased balance;Decreased knowledge of use of DME;Decreased coordination       PT Treatment Interventions DME instruction;Therapeutic exercise;Balance training;Functional mobility training;Cognitive remediation;Therapeutic activities;Patient/family education;Neuromuscular re-education    PT Goals (Current goals can be found in the Care Plan section)  Acute Rehab PT Goals Patient Stated Goal: participate in moving, return to work PT Goal Formulation: With family Time For Goal Achievement: 12/04/21 Potential to Achieve Goals: Fair    Frequency Min 3X/week     Co-evaluation PT/OT/SLP Co-Evaluation/Treatment: Yes Reason for Co-Treatment: Complexity of the patient's impairments (multi-system involvement);For patient/therapist safety PT goals addressed during session: Mobility/safety with mobility         AM-PAC PT "6 Clicks" Mobility  Outcome Measure Help needed turning from your back to your side while in a flat bed without using bedrails?: A Lot Help needed moving from lying on your back to sitting on the side of a flat bed without using bedrails?: A Lot Help needed moving to  and from a bed to a chair (including a wheelchair)?: Total Help needed standing up from a chair using your arms (e.g., wheelchair or bedside chair)?: Total Help needed to walk in hospital room?: Total Help needed climbing 3-5 steps with a railing? : Total 6 Click Score: 8    End of Session   Activity  Tolerance: Patient tolerated treatment well Patient left: in bed;with call bell/phone within reach;with bed alarm set;with family/visitor present Nurse Communication: Mobility status PT Visit Diagnosis: Other abnormalities of gait and mobility (R26.89);Muscle weakness (generalized) (M62.81)    Time: 1610-9604 PT Time Calculation (min) (ACUTE ONLY): 23 min   Charges:   PT Evaluation $PT Eval Moderate Complexity: 1 Mod          Halley Kincer P, PT Acute Rehabilitation Services Office: [phone]   Enedina Finner Ledonna Dormer 11/20/2021, 11:40 AM

## 2021-11-20 NOTE — Progress Notes (Signed)
   11/20/21 1345  Clinical Encounter Type  Visited With Family (Mother:)  Visit Type Follow-up;Critical Care  Referral From Physician  Consult/Referral To Chaplain Melvenia Beam)  Recommendations Family Support  Spiritual Encounters  Spiritual Needs Emotional;Grief support   Met with patient's mother, [name], at patient's bedside. Chaplain provided [name] opportunity for reflective conversation and outlet for emotion. [name] was encouraged about [name]'s P.T. progress and swallow test this morning. [address] [place], [place] [phone]

## 2021-11-20 NOTE — Progress Notes (Signed)
NAME:  Justin Brownfield, MRN:  [id number], DOB:  [birth year], LOS: 7 ADMISSION DATE:  11/13/2021, CONSULTATION DATE:  7/28 REFERRING MD:  Dr. 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  DVT ultrasound negative.  Patient discharged with ABX on 11/07/2021.  During hospital course patient with low K and mag.  During the past few days per husband patient has been nauseous and not able to take her medications.  She has been taking medication for nausea.  On 7/28 in the afternoon, patient's husband states he went out and about 20 minutes later found patient down on floor with agonal respirations.  He called 911 and started CPR.  Fire dept/EMS arrived finding patient to be in VT.  Multiple shocks delivered, epi and amnio given.  About 15 minutes until ROSC was obtained by EMS arrival.  Patient intubated and was given sedation.  Patient transferred to [facility] ED.  Upon arrival to [facility] on 7/28, patient intubated and sedated.  Patient on epi drip.  EKG with no ST elevation but prolonged QTc.  Cardiology consulted.  CXR with no signs of pneumonia; ETT in good position.  Pertinent ED labs: UDS WNL, UA WNL, WBC 23.9, K2.7, glucose 290, AST 178, alk phos 321, troponin 111, ABG 7.24, 45, 156, 19.  CT head and PE chest pending.  Other labs and cultures pending.  Patient noted to be having some posturing and possible myoclonic activity.  Versed given.  EEG ordered.  PCCM consulted for ICU admission.   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 MRI  Interim History / Subjective:  Patient was successfully extubated yesterday  Remain afebrile Remain agitated and restless in the bed   Objective   Blood pressure (!) 91/54, pulse 92, temperature 99 F (37.2 C), resp. rate (!) 23, height 5\' 7"  (1.702 m), weight 96.6 kg, SpO2 96 %.        Intake/Output Summary (Last 24 hours) at 11/20/2021 1016 Last data filed at 11/20/2021 0100 Gross per 24 hour  Intake 80.04 ml  Output 695 ml  Net -614.96 ml   Filed Weights   11/18/21 0400 11/19/21 0334 11/20/21 0118  Weight: 98 kg 89.6 kg 96.6 kg    Examination: Physical exam: General: Acutely ill-appearing female, lying on the bed HEENT: [place]/AT, eyes anicteric.  moist mucus membranes Neuro: Restless, agitated, opens eyes with vocal stimuli, following simple commands in bilateral lower extremities, no response to movement in bilateral upper extremities Chest: Coarse breath sounds, no wheezes or rhonchi Heart: Regular rate and rhythm, no murmurs or gallops Abdomen: Soft, nontender, nondistended, bowel sounds present Skin: No rash   Resolved Hospital Problem list   Hx of splenic vein thrombosis: previously on warfarin complicated by abdominal hematoma requiring IR intervention, not presently on warfarin Hyponatremia   Assessment & Plan:  VT/Vfib Cardiac arrest: patient unseen for about 20 minutes; found down pulseless; found to be  in VT shocked multiple times, given epi and amio; ROSC 15 after EMS arrived to seen. Per husband has been having N/V taking anti-nausea meds. Not taking her home meds that husband was aware of that past few days due to this.  UDS neg, alcohol neg. 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 No active seizures on repeat EEG MRI confirmed hypoxic/anoxic injury Appreciate neurology follow-up Patient was loaded with Vimpat, continue Vimpat twice daily Topamax and gabapentin is on hold  Postarrest encephalopathy versus opiate withdrawal Patient is restless and grimacing in the bed She had loose bowel movements She takes oxycodone at home for chronic pancreatitis Currently we do not have oral access, started on Dilaudid Once oral access is established, will restart oxycodone  Acute respiratory failure with hypoxia Postextubation stridor Hx of asthma: On Breo-Ellipta, abuterol, and singulair, no bronchospasm on vent Patient was extubated successfully yesterday Remains on nasal cannula oxygen Continue to titrate with O2 sat goal 92% Received steroid for 4 doses with improvement in Continue scheduled Pulmicort and Brovana, as needed albuterol  Right lower extremity cellulitis Aspiration pneumonia Completed treatment with ceftriaxone  Chronic diastolic HF: last echo 2018 EF 60-65% Likely Type II NSTEMI Monitor intake and output  Call use disorder Chronic pain: Oxycodone at home Watch for signs of withdrawal I think patient is withdrawing from opiate We will resume oxycodone once oral access established  HTN/HLD -Continue holding home spironolactone/Crestor  AKI due to ischemic ATN Serum creatinine continue to trend up Started on IV fluid Monitor intake and output Avoid nephrotoxic agents  Shock liver LFTs are stable  Thrombocytopenia Platelet count continue to trend up   Best Practice (right click and "Reselect all SmartList Selections" daily)   Diet/type: Cortrak today DVT prophylaxis: Heparin GI prophylaxis: PPI Lines: n/A Foley: yes Code Status:  full code Last date of multidisciplinary goals of care discussion [8/4 husband and mother updated at bedside.]   Total  critical care time: 35 minutes  Performed by: Cheri Fowler   Critical care time was exclusive of separately billable procedures and treating other patients.   Critical care was necessary to treat or prevent imminent or life-threatening deterioration.   Critical care was time spent personally by me on the following activities: development of treatment plan with patient and/or surrogate as well as nursing, discussions with consultants, evaluation of patient's response to treatment, examination of patient, obtaining history from patient or surrogate, ordering and performing treatments and interventions, ordering and review of laboratory studies, ordering and review of radiographic studies, pulse oximetry and re-evaluation of patient's condition.   Cheri Fowler, MD [place] [facility] See Amion for pager If no response to pager, please call [phone] until 7pm After 7pm, Please call E-link [phone]

## 2021-11-20 NOTE — Progress Notes (Signed)
Neurology Progress Note  Subjective: Mother is at bedside today. She has spoken to the Chaplain twice, which she felt was comforting. Patient looks more comfortable today, stridor resolved. Stirs in response to sternal rub, will localize to pain BUE and wiggle toes bilat on command (though inconsistently).   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 Subcortical nuclei are spared. Personally reviewed.  Exam: Vitals:   11/20/21 1700 11/20/21 1800  BP: 113/86 (!) 111/57  Pulse: (!) 110 85  Resp: (!) 22 (!) 22  Temp: 98.6 F (37 C) 98.4 F (36.9 C)  SpO2: 100% 99%   Gen: lethargic but more arousable to milder physical stimuli Resp: normal WOB CV: RRR  Neuro: MS: lethargic but more arousable to milder physical stimuli, does not answer orientation questions, follows commands to wiggle toes but not other commands. Speech: no attempts to speak CN: PERRL, (+) corneals, will not open eyes on command but when examiner opens them she appears to attend, (+) cough and gag Motor: withdraws in response to noxious stimuli BUE, wiggles toes bilat on command Reflexes: 1+ symm throughout, toes not tested 2/2 boots  Pertinent Labs: Creatinine 2.66 Lab Results  Component Value Date   WBC 12.8 (H) 11/20/2021   HGB 10.8 (L) 11/20/2021   HCT 33.9 (L) 11/20/2021   MCV 101.8 (H) 11/20/2021   PLT 143 (L) 11/20/2021   CMP     Component Value Date/Time   NA 139 11/20/2021 0634   K 3.6 11/20/2021 0634   CL 108 11/20/2021 0634   CO2 11 (L) 11/20/2021 0634   GLUCOSE 124 (H) 11/20/2021 0634   BUN 47 (H) 11/20/2021 0634   CREATININE 3.48 (H) 11/20/2021 0634   CALCIUM 9.5 11/20/2021 0634   PROT 6.5 11/20/2021 0634   ALBUMIN 2.4 (L) 11/20/2021 0634   AST 91 (H) 11/20/2021 0634   ALT 38 11/20/2021 0634   ALKPHOS 397 (H) 11/20/2021 0634   BILITOT 3.1 (H) 11/20/2021 0634    GFRNONAA 17 (L) 11/20/2021 4765   Imaging:  EEG overnight 7/31 - 8/1:  "This study is suggestive of moderate to severe diffuse encephalopathy, nonspecific etiology. No seizures or epileptiform discharges were seen throughout the recording."  EEG 8/1-8/2: This study is suggestive of moderate to severe diffuse encephalopathy, nonspecific etiology. No seizures or epileptiform discharges were seen throughout the recording  Impression: 36 year old female with a history of seizures who presented s/p cardiac arrest, presumably due to prolonged QTc. She was resumed on her home dose of Topamax, and a renal dose of gabapentin. There was initial concern for possible missed medication doses but family reported today that she has not missed any medications. Pharmacy on board to continue assistance with dosing of gabapentin as she fluctuates. EEG monitoring 7/31-8/2 without evidence of seizures or epileptiform discharges. She was extubated 8/3 although mental status remains poor. She did not interact with me at all this afternoon, although per family she seemed to engage a few times earlier (smiled twice when they told her a story).   I had a very long conversation with mother and husband at bedside on 8/3.  I explained to them that her MRI did show evidence of hypoxic ischemic injury and that in all likelihood [place] would not return to her former full cognitive and neurologic function.  Unfortunately at this point we cannot say how much she will recover, nor how long will take.  Her youth is in her favor.  We will begin rehabilitation as soon as she is able to participate.  Dr. Merrily Pew and I both counseled the family that it will be months before we really have an idea of what her recovery and functional outcome will look like.  Family understandably very distraught, particularly her mother who does not live locally and has little support system other than the patient's husband who is also going through his own  grieving process.  The chaplain has visited mother twice since yesterday which she found very comforting.   Patient is currently unable to take p.o. safely.  She was on gabapentin for neuropathy which has sent antiseizure qualities although is not a powerful AED.  We have held this in hopes that this will improve her mental status.  She is unable to take topiramate as there is no IV equivalent.  She has not tolerated Keppra in the past.  Therefore we have transitioned her off topiramate onto vimpat. If she tolerates this well with no breakthrough seizures or side effects then we will plan to continue this going forward and not restart the topiramate.  Recommendations: - Continue vimpat 100mg  IV q 12 hrs - Continue to hold gabapentin - Topiramate d/c'd - Will continue to follow  This patient is critically ill and at significant risk of neurological worsening, death and care requires constant monitoring of vital signs, hemodynamics,respiratory and cardiac monitoring, neurological assessment, discussion with family, other specialists and medical decision making of high complexity. I spent 75 minutes of neurocritical care time  in the care of  this patient. This was time spent independent of any time provided by nurse practitioner or PA.  , MD [facility] [phone]  If 7pm- 7am, please page neurology on call as listed in AMION.

## 2021-11-20 NOTE — Evaluation (Signed)
Occupational Therapy Evaluation Patient Details Name: Kara [name] MRN: [id number] DOB: February 10, 1986 Today's Date: 11/20/2021   History of Present Illness Pt is a 36 y.o. female admitted 11/13/21 with v-fib/VT cardiac arrest, some posturing and possible myoclonic activity; UDS (-), EEG (-). NFA:[id number] diffusion in bil posterior frontal, parietal, and superior occipital cortex, concerning for cytotoxic edema from hypoxic ischemic injury. ETT 7/28-8/3. PMH includes HF, HLD, seizure, ETOH use, asthma, anxiety, chronic pain, long QT syndrome.   Clinical Impression   Patient admitted for the diagnosis above.  PTA she lives at home with her spouse, works full time, and needed no assist with any aspect of ADL,iADL or mobility.  Deficits impacting independence are listed below.  OT will continue efforts in the acute setting with SNF recommended for post acute rehab prior to returning home depending on progress.        Recommendations for follow up therapy are one component of a multi-disciplinary discharge planning process, led by the attending physician.  Recommendations may be updated based on patient status, additional functional criteria and insurance authorization.   Follow Up Recommendations  Skilled nursing-short term rehab (<3 hours/day)    Assistance Recommended at Discharge Frequent or constant Supervision/Assistance  Patient can return home with the following Two people to help with walking and/or transfers;Two people to help with bathing/dressing/bathroom;Direct supervision/assist for medications management;Help with stairs or ramp for entrance;Assist for transportation;Assistance with cooking/housework    Functional Status Assessment  Patient has had a recent decline in their functional status and demonstrates the ability to make significant improvements in function in a reasonable and predictable amount of time.  Equipment Recommendations  Wheelchair (measurements OT);Wheelchair cushion  (measurements OT)    Recommendations for Other Services       Precautions / Restrictions Precautions Precautions: Fall Precaution Comments: Watch HR and O2 sats, Cortrak. Restrictions Weight Bearing Restrictions: No      Mobility Bed Mobility Overal bed mobility: Needs Assistance Bed Mobility: Rolling, Supine to Sit, Sit to Supine Rolling: Mod assist   Supine to sit: Max assist, +2 for physical assistance Sit to supine: Total assist, +2 for physical assistance   General bed mobility comments: Patient not alert or necessarily following commands, but was able to use therapist initiated momentum to assist with rolling side to side. Patient Response: Restless  Transfers                   General transfer comment: Not safe      Balance Overall balance assessment: Needs assistance Sitting-balance support: Feet unsupported Sitting balance-Leahy Scale: Poor Sitting balance - Comments: Patient with heavy leans at times, and only needing Min A at times to maintain unsupported sitting. Postural control: Posterior lean, Right lateral lean, Left lateral lean                                 ADL either performed or assessed with clinical judgement   ADL                                         General ADL Comments: Dep with ADL at this time bedlevel     Vision   Vision Assessment?: Vision impaired- to be further tested in functional context Additional Comments: opening eyes on occasion, inconsistent with eye contact     Perception Perception Perception: Not  tested   Praxis Praxis Praxis: Not tested    Pertinent Vitals/Pain Pain Assessment Pain Assessment: PAINAD Breathing: occasional labored breathing, short period of hyperventilation Negative Vocalization: occasional moan/groan, low speech, negative/disapproving quality Facial Expression: smiling or inexpressive Body Language: tense, distressed pacing, fidgeting Consolability: no  need to console PAINAD Score: 3 Facial Expression: Tense Pain Intervention(s): Monitored during session     Hand Dominance Right   Extremity/Trunk Assessment Upper Extremity Assessment RUE Deficits / Details: ocassional nonpurposeful movement noted - did squeeze finger of OT times one LUE Deficits / Details: ocassional nonpurposeful movement noted   Lower Extremity Assessment Lower Extremity Assessment: Defer to PT evaluation   Cervical / Trunk Assessment Cervical / Trunk Assessment: Normal   Communication Communication Communication: Expressive difficulties   Cognition Arousal/Alertness: Lethargic Behavior During Therapy: Restless Overall Cognitive Status: Impaired/Different from baseline Area of Impairment: Rancho level               Rancho Levels of Cognitive Functioning [place]igos Scales of Cognitive Functioning: Generalized Response                 Ranch[place] Los Amigos Scales of Cognitive Functioning: Generalized Response   General Comments   HR to 101 with sitting    Exercises     Shoulder Instructions      Home Living Family/patient expects to be discharged to:: Private residence Living Arrangements: Spouse/significant other Available Help at Discharge: Family;Available 24 hours/day Type of Home: House Home Access: Stairs to enter [employer] of Steps: 1   Home Layout: Two level;Able to live on main level with bedroom/bathroom     Bathroom Shower/Tub: Walk-in shower;Tub/shower unit   Bathroom Toilet: Standard     Home Equipment: Agricultural consultant (2 wheels)   Additional Comments: was as [facility] for knee hematoma Wed-Sat 7/12-7/15, works as a Associate Professor at [employer]      Prior Functioning/Environment Prior Level of Function : Independent/Modified Independent;Working/employed;Driving                        OT Problem List: Decreased strength;Decreased activity tolerance;Impaired balance (sitting and/or  standing);Decreased safety awareness;Decreased cognition      OT Treatment/Interventions: Self-care/ADL training;Patient/family education;Therapeutic exercise;Balance training;Therapeutic activities;Cognitive remediation/compensation;DME and/or AE instruction    OT Goals(Current goals can be found in the care plan section) Acute Rehab OT Goals OT Goal Formulation: Patient unable to participate in goal setting Time For Goal Achievement: 12/04/21 Potential to Achieve Goals: Good ADL Goals Pt Will Perform Grooming: with min assist;sitting Additional ADL Goal #1: Patient will follow one step commands 50% of the time with Min VC's as needed. Additional ADL Goal #2: Patient will perform bed mobility with Mod A of one in prep for ADL tasks.  OT Frequency: Min 2X/week    Co-evaluation              AM-PAC OT "6 Clicks" Daily Activity     Outcome Measure Help from another person eating meals?: Total Help from another person taking care of personal grooming?: Total Help from another person toileting, which includes using toliet, bedpan, or urinal?: Total Help from another person bathing (including washing, rinsing, drying)?: Total Help from another person to put on and taking off regular upper body clothing?: Total Help from another person to put on and taking off regular lower body clothing?: Total 6 Click Score: 6   End of Session Equipment Utilized During Treatment: Oxygen Nurse Communication: Mobility status  Activity Tolerance: Treatment  limited secondary to medical complications (Comment) Patient left: in bed;with call bell/phone within reach;with family/visitor present  OT Visit Diagnosis: Unsteadiness on feet (R26.81);Muscle weakness (generalized) (M62.81);Other symptoms and signs involving cognitive function                Time: 1884-1660 OT Time Calculation (min): 24 min Charges:  OT General Charges $OT Visit: 1 Visit OT Evaluation $OT Eval Moderate Complexity: 1  Mod  11/20/2021  RP, OTR/L  Acute Rehabilitation Services  Office:  [phone]   [name] 11/20/2021, 11:22 AM

## 2021-11-20 NOTE — TOC Initial Note (Addendum)
Transition of Care [facility]) - Initial/Assessment Note    Patient Details  Name: Kara [name] MRN: [id number] Date of Birth: [birth year]  Transition of Care [facility]) CM/SW Contact:    Delilah Shan, LCSWA Phone Number: 11/20/2021, 3:04 PM  Clinical Narrative:                  CSW received consult for possible SNF placement at time of discharge. Due to patients orientation CSW spoke with patients spouse [name] regarding PT recommendation of SNF placement at time of discharge.  Patients spouse expressed understanding of PT recommendation and is agreeable to SNF placement for patient at time of discharge. Patients spouse gave CSW permission to fax out initial referral near the [place] area. CSW discussed insurance authorization process with patients spouse.Patients spouse reports patient has not received the COVID vaccines.  CSW following to fax out initial referral when appropriate. Patient currently has a cortrak.No further questions reported at this time. CSW to continue to follow and assist with discharge planning needs.   Expected Discharge Plan: Skilled Nursing Facility Barriers to Discharge: Continued Medical Work up   Patient Goals and CMS Choice   CMS Medicare.gov Compare Post Acute Care list provided to:: Patient Represenative (must comment) (patients spouse) Choice offered to / list presented to : Spouse  Expected Discharge Plan and Services Expected Discharge Plan: Skilled Nursing Facility In-house Referral: Clinical Social Work Discharge Planning Services: CM Consult   Living arrangements for the past 2 months: Single Family Home                                      Prior Living Arrangements/Services Living arrangements for the past 2 months: Single Family Home Lives with:: Spouse Patient language and need for interpreter reviewed:: Yes Do you feel safe going back to the place where you live?: No   SNF  Need for Family Participation in Patient Care: Yes  (Comment) Care giver support system in place?: Yes (comment)   Criminal Activity/Legal Involvement Pertinent to Current Situation/Hospitalization: No - Comment as needed  Activities of Daily Living Home Assistive Devices/Equipment: None ADL Screening (condition at time of admission) Patient's cognitive ability adequate to safely complete daily activities?: Yes Is the patient deaf or have difficulty hearing?: No Does the patient have difficulty seeing, even when wearing glasses/contacts?: No Does the patient have difficulty concentrating, remembering, or making decisions?: No Patient able to express need for assistance with ADLs?: No Does the patient have difficulty dressing or bathing?: Yes Independently performs ADLs?: No Communication: Dependent Is this a change from baseline?: Change from baseline, expected to last >3 days Dressing (OT): Dependent Is this a change from baseline?: Change from baseline, expected to last >3 days Grooming: Dependent Is this a change from baseline?: Change from baseline, expected to last >3 days Feeding: Dependent Is this a change from baseline?: Change from baseline, expected to last >3 days Bathing: Dependent Is this a change from baseline?: Change from baseline, expected to last >3 days Toileting: Dependent Is this a change from baseline?: Change from baseline, expected to last >3days In/Out Bed: Dependent Is this a change from baseline?: Change from baseline, expected to last >3 days Walks in Home: Independent Does the patient have difficulty walking or climbing stairs?: No Weakness of Legs: Both Weakness of Arms/Hands: Both  Permission Sought/Granted Permission sought to share information with : Case Manager, Family Supports, Oceanographer granted  to share information with : Yes, Verbal Permission Granted  Share Information with NAME: [name]  Permission granted to share info w AGENCY: SNF  Permission granted  to share info w Relationship: spouse  Permission granted to share info w Contact Information: [name] [phone]  Emotional Assessment       Orientation: :  (not following commands) Alcohol / Substance Use: Not Applicable Psych Involvement: No (comment)  Admission diagnosis:  Cardiac arrest [facility]) [I46.9] Patient Active Problem List   Diagnosis Date Noted   AKI (acute kidney injury) (HCC)    Seizure (HCC)    Altered mental status    Cardiac arrest (HCC) 11/13/2021   Acute respiratory failure (HCC)    Prolonged Q-T interval on ECG    Hypokalemia    Acute encephalopathy    PCP:  [name], MD Pharmacy:   [id number] - [place], [place] - [address] AT [facility] [address] [facility] [address] [place] [address] Phone: [phone] Fax: [phone]     Social Determinants of Health (SDOH) Interventions    Readmission Risk Interventions     No data to display

## 2021-11-20 NOTE — Evaluation (Signed)
Clinical/Bedside Swallow Evaluation Patient Details  Name: Kara [name] MRN: [id number] Date of Birth: 1985-08-11  Today's Date: 11/20/2021 Time: SLP Start Time (ACUTE ONLY): 0865 SLP Stop Time (ACUTE ONLY): 0955 SLP Time Calculation (min) (ACUTE ONLY): 12 min  Past Medical History: History reviewed. No pertinent past medical history. Past Surgical History: The histories are not reviewed yet. Please review them in the "History" navigator section and refresh this SmartLink. HPI:  Patient is a 36 year old female who presented to [facility] ED on 7/28 s/p cardiac arrest. Fire dept/EMS arrived finding patient to be in [place].  Multiple shocks delivered, epi and amnio given.  About 15 minutes until ROSC was obtained by EMS arrival.  MRI 8/2 "showed restricted diffusion in bilat posterior frontal, parietal, and superior occipital cortex c/w cytotoxic edema from hypoxic ischemic injury. 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    Assessment / Plan / Recommendation  Clinical Impression  Pt presents with a mild oral dysphagia and clinical indicators of pharyngeal dysphagia s/p cardiac arrest with CPR, hypoxic brain injury, and 4 day intubation.  Pt exhibited immediate response to thermal stimulation to lips.  Pt roused, closed lips tightly, and withdrew, shaking head side to side and attempting to move away.  SLP provided oral care prior to administration of PO trials.  Pt allowed oral care but was noted to bite on swab and suction.  With small amounts of water by spoon there was R anterior spillage.  Pt expectorated at least portions of 2 of 5 trials.  Pharyngeal swallow response was palpable on 4 trials. There was immediate cough on one trial.  Provided education to family (husband, mother) regarding role of SLP and current early treatment plan.  SLP will follow to  determine readiness for instrumental swallow assessment and/or PO diet initiation.    Recommend pt remain NPO with alternate means of nutrition, hydration, and medication.   SLP Visit Diagnosis: Dysphagia, unspecified (R13.10)    Aspiration Risk  Mild aspiration risk    Diet Recommendation NPO;Alternative means - temporary   Medication Administration: Via alternative means    Other  Recommendations      Recommendations for follow up therapy are one component of a multi-disciplinary discharge planning process, led by the attending physician.  Recommendations may be updated based on patient status, additional functional criteria and insurance authorization.  Follow up Recommendations Skilled nursing-short term rehab (<3 hours/day)      Assistance Recommended at Discharge Frequent or constant Supervision/Assistance  Functional Status Assessment Patient has had a recent decline in their functional status and demonstrates the ability to make significant improvements in function in a reasonable and predictable amount of time.  Frequency and Duration min 2x/week  2 weeks       Prognosis Prognosis for Safe Diet Advancement: Fair Barriers to Reach Goals: Severity of deficits      Swallow Study   General Date of Onset: 11/13/21 HPI: Patient is a 36 year old female who presented to [facility] ED on 7/28 s/p cardiac arrest. Fire dept/EMS arrived finding patient to be in [place].  Multiple shocks delivered, epi and amnio given.  About 15 minutes until ROSC was obtained by EMS arrival.  MRI 8/2 "showed restricted diffusion in bilat posterior frontal, parietal, and superior occipital cortex c/w cytotoxic edema from hypoxic ischemic injury. 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 Type of Study: Bedside Swallow Evaluation Previous Swallow Assessment:  none Diet Prior to this Study: NPO Temperature Spikes Noted: No History of Recent Intubation: Yes Length of Intubations (days): 7 days Date extubated: 11/19/21 Behavior/Cognition: Doesn't follow directions Oral Cavity Assessment: Dried secretions Oral Care Completed by SLP: Yes Oral Cavity - Dentition: Adequate natural dentition Self-Feeding Abilities: Total assist Patient Positioning: Upright in bed Baseline Vocal Quality: Not observed Volitional Cough: Cognitively unable to elicit Volitional Swallow: Unable to elicit    Oral/Motor/Sensory Function Overall Oral Motor/Sensory Function: Within functional limits (Unable to assess 2/2 cognition) Facial Symmetry: Within Functional Limits   Ice Chips Other Comments: Withdrawal to thermal stimulation to lips   Thin Liquid Thin Liquid: Impaired Presentation: Spoon Oral Phase Functional Implications: Right anterior spillage (expectoration) Pharyngeal  Phase Impairments: Cough - Immediate    Nectar Thick Nectar Thick Liquid: Not tested   Honey Thick Honey Thick Liquid: Not tested   Puree Puree: Not tested   Solid     Solid: Not tested      Kerrie Pleasure, MA, CCC-SLP Acute Rehabilitation Services Office: [phone] 11/20/2021,10:26 AM

## 2021-11-21 ENCOUNTER — Inpatient Hospital Stay (HOSPITAL_COMMUNITY): Payer: BC Managed Care – PPO

## 2021-11-21 DIAGNOSIS — E876 Hypokalemia: Secondary | ICD-10-CM | POA: Diagnosis not present

## 2021-11-21 DIAGNOSIS — E872 Acidosis, unspecified: Secondary | ICD-10-CM | POA: Diagnosis not present

## 2021-11-21 DIAGNOSIS — R9431 Abnormal electrocardiogram [ECG] [EKG]: Secondary | ICD-10-CM | POA: Diagnosis not present

## 2021-11-21 DIAGNOSIS — G9340 Encephalopathy, unspecified: Secondary | ICD-10-CM | POA: Diagnosis not present

## 2021-11-21 DIAGNOSIS — R569 Unspecified convulsions: Secondary | ICD-10-CM | POA: Diagnosis not present

## 2021-11-21 DIAGNOSIS — I469 Cardiac arrest, cause unspecified: Secondary | ICD-10-CM | POA: Diagnosis not present

## 2021-11-21 LAB — POCT I-STAT 7, (LYTES, BLD GAS, ICA,H+H)
Acid-base deficit: 14 mmol/L — ABNORMAL HIGH (ref 0.0–2.0)
Acid-base deficit: 15 mmol/L — ABNORMAL HIGH (ref 0.0–2.0)
Bicarbonate: 10.8 mmol/L — ABNORMAL LOW (ref 20.0–28.0)
Bicarbonate: 13.3 mmol/L — ABNORMAL LOW (ref 20.0–28.0)
Calcium, Ion: 1.34 mmol/L (ref 1.15–1.40)
Calcium, Ion: 1.37 mmol/L (ref 1.15–1.40)
HCT: 31 % — ABNORMAL LOW (ref 36.0–46.0)
HCT: 31 % — ABNORMAL LOW (ref 36.0–46.0)
Hemoglobin: 10.5 g/dL — ABNORMAL LOW (ref 12.0–15.0)
Hemoglobin: 10.5 g/dL — ABNORMAL LOW (ref 12.0–15.0)
O2 Saturation: 94 %
O2 Saturation: 98 %
Patient temperature: 37.4
Patient temperature: 37.2
Potassium: 3.8 mmol/L (ref 3.5–5.1)
Potassium: 3.6 mmol/L (ref 3.5–5.1)
Sodium: 145 mmol/L (ref 135–145)
Sodium: 146 mmol/L — ABNORMAL HIGH (ref 135–145)
TCO2: 11 mmol/L — ABNORMAL LOW (ref 22–32)
TCO2: 14 mmol/L — ABNORMAL LOW (ref 22–32)
pCO2 arterial: 21.6 mmHg — ABNORMAL LOW (ref 32–48)
pCO2 arterial: 40 mmHg (ref 32–48)
pH, Arterial: 7.307 — ABNORMAL LOW (ref 7.35–7.45)
pH, Arterial: 7.13 — CL (ref 7.35–7.45)
pO2, Arterial: 75 mmHg — ABNORMAL LOW (ref 83–108)
pO2, Arterial: 134 mmHg — ABNORMAL HIGH (ref 83–108)

## 2021-11-21 LAB — BASIC METABOLIC PANEL
Anion gap: 16 — ABNORMAL HIGH (ref 5–15)
Anion gap: 14 (ref 5–15)
BUN: 47 mg/dL — ABNORMAL HIGH (ref 6–20)
BUN: 43 mg/dL — ABNORMAL HIGH (ref 6–20)
CO2: 13 mmol/L — ABNORMAL LOW (ref 22–32)
CO2: 14 mmol/L — ABNORMAL LOW (ref 22–32)
Calcium: 9 mg/dL (ref 8.9–10.3)
Calcium: 9.2 mg/dL (ref 8.9–10.3)
Chloride: 112 mmol/L — ABNORMAL HIGH (ref 98–111)
Chloride: 116 mmol/L — ABNORMAL HIGH (ref 98–111)
Creatinine, Ser: 3.21 mg/dL — ABNORMAL HIGH (ref 0.44–1.00)
Creatinine, Ser: 3.01 mg/dL — ABNORMAL HIGH (ref 0.44–1.00)
GFR, Estimated: 18 mL/min — ABNORMAL LOW (ref >60)
GFR, Estimated: 20 mL/min — ABNORMAL LOW (ref >60)
Glucose, Bld: 122 mg/dL — ABNORMAL HIGH (ref 70–99)
Glucose, Bld: 110 mg/dL — ABNORMAL HIGH (ref 70–99)
Potassium: 2.9 mmol/L — ABNORMAL LOW (ref 3.5–5.1)
Potassium: 3.8 mmol/L (ref 3.5–5.1)
Sodium: 141 mmol/L (ref 135–145)
Sodium: 144 mmol/L (ref 135–145)

## 2021-11-21 LAB — GLUCOSE, CAPILLARY
Glucose-Capillary: 117 mg/dL — ABNORMAL HIGH (ref 70–99)
Glucose-Capillary: 112 mg/dL — ABNORMAL HIGH (ref 70–99)
Glucose-Capillary: 123 mg/dL — ABNORMAL HIGH (ref 70–99)
Glucose-Capillary: 100 mg/dL — ABNORMAL HIGH (ref 70–99)
Glucose-Capillary: 118 mg/dL — ABNORMAL HIGH (ref 70–99)
Glucose-Capillary: 137 mg/dL — ABNORMAL HIGH (ref 70–99)

## 2021-11-21 LAB — CBC
HCT: 28.6 % — ABNORMAL LOW (ref 36.0–46.0)
Hemoglobin: 9.2 g/dL — ABNORMAL LOW (ref 12.0–15.0)
MCH: 32.5 pg (ref 26.0–34.0)
MCHC: 32.2 g/dL (ref 30.0–36.0)
MCV: 101.1 fL — ABNORMAL HIGH (ref 80.0–100.0)
Platelets: 160 10*3/uL (ref 150–400)
RBC: 2.83 MIL/uL — ABNORMAL LOW (ref 3.87–5.11)
RDW: 15.5 % (ref 11.5–15.5)
WBC: 8.3 10*3/uL (ref 4.0–10.5)
nRBC: 0 % (ref 0.0–0.2)

## 2021-11-21 LAB — MAGNESIUM: Magnesium: 2.2 mg/dL (ref 1.7–2.4)

## 2021-11-21 MED ORDER — MIDAZOLAM HCL 2 MG/2ML IJ SOLN
INTRAMUSCULAR | Status: AC
  Filled 2021-11-21: qty 2

## 2021-11-21 MED ORDER — FENTANYL CITRATE PF 50 MCG/ML IJ SOSY
50.0000 ug | PREFILLED_SYRINGE | INTRAMUSCULAR | Status: AC | PRN
  Administered 2021-11-22 (×2): 50 ug via INTRAVENOUS
  Filled 2021-11-21 (×3): qty 1

## 2021-11-21 MED ORDER — ROCURONIUM BROMIDE 10 MG/ML (PF) SYRINGE
PREFILLED_SYRINGE | INTRAVENOUS | Status: AC
  Administered 2021-11-21: 100 mg via INTRAVENOUS
  Filled 2021-11-21: qty 10

## 2021-11-21 MED ORDER — MIDAZOLAM HCL 2 MG/2ML IJ SOLN
2.0000 mg | INTRAMUSCULAR | Status: DC | PRN
  Administered 2021-11-21 – 2021-11-22 (×5): 2 mg via INTRAVENOUS
  Filled 2021-11-21 (×5): qty 2

## 2021-11-21 MED ORDER — HYDROMORPHONE HCL 1 MG/ML IJ SOLN
0.2500 mg | INTRAMUSCULAR | Status: DC | PRN
  Administered 2021-11-21: 0.25 mg via INTRAVENOUS

## 2021-11-21 MED ORDER — OLANZAPINE 10 MG IM SOLR
10.0000 mg | Freq: Once | INTRAMUSCULAR | Status: AC | PRN
Start: 2021-11-21 — End: 2021-11-21
  Administered 2021-11-21: 10 mg via INTRAMUSCULAR
  Filled 2021-11-21: qty 10

## 2021-11-21 MED ORDER — ACETAMINOPHEN 160 MG/5ML PO SOLN
ORAL | Status: AC
  Filled 2021-11-21: qty 20.3

## 2021-11-21 MED ORDER — PHENYLEPHRINE 80 MCG/ML (10ML) SYRINGE FOR IV PUSH (FOR BLOOD PRESSURE SUPPORT)
PREFILLED_SYRINGE | INTRAVENOUS | Status: AC
  Filled 2021-11-21: qty 10

## 2021-11-21 MED ORDER — SODIUM BICARBONATE 8.4 % IV SOLN
50.0000 meq | Freq: Once | INTRAVENOUS | Status: AC
  Administered 2021-11-22: 50 meq via INTRAVENOUS
  Filled 2021-11-21: qty 50

## 2021-11-21 MED ORDER — DEXMEDETOMIDINE HCL IN NACL 400 MCG/100ML IV SOLN
0.4000 ug/kg/h | INTRAVENOUS | Status: DC
  Administered 2021-11-21: 1.2 ug/kg/h via INTRAVENOUS
  Administered 2021-11-22: 0.7 ug/kg/h via INTRAVENOUS
  Administered 2021-11-22 (×3): 1.2 ug/kg/h via INTRAVENOUS
  Administered 2021-11-22: 1 ug/kg/h via INTRAVENOUS
  Administered 2021-11-23: 0.9 ug/kg/h via INTRAVENOUS
  Administered 2021-11-23: 0.6 ug/kg/h via INTRAVENOUS
  Administered 2021-11-23: 0.8 ug/kg/h via INTRAVENOUS
  Administered 2021-11-24 (×2): 0.7 ug/kg/h via INTRAVENOUS
  Filled 2021-11-21 (×10): qty 100

## 2021-11-21 MED ORDER — ETOMIDATE 2 MG/ML IV SOLN: 20.0000 mg | Freq: Once | INTRAVENOUS | Status: AC

## 2021-11-21 MED ORDER — HYDROMORPHONE HCL 1 MG/ML IJ SOLN
INTRAMUSCULAR | Status: AC
  Filled 2021-11-21: qty 0.5

## 2021-11-21 MED ORDER — SUCCINYLCHOLINE CHLORIDE 200 MG/10ML IV SOSY
PREFILLED_SYRINGE | INTRAVENOUS | Status: AC
  Filled 2021-11-21: qty 10

## 2021-11-21 MED ORDER — POTASSIUM CHLORIDE 10 MEQ/100ML IV SOLN
10.0000 meq | INTRAVENOUS | Status: AC
  Administered 2021-11-21 (×4): 10 meq via INTRAVENOUS
  Filled 2021-11-21 (×4): qty 100

## 2021-11-21 MED ORDER — ROCURONIUM BROMIDE 10 MG/ML (PF) SYRINGE: 100.0000 mg | PREFILLED_SYRINGE | Freq: Once | INTRAVENOUS | Status: AC

## 2021-11-21 MED ORDER — FENTANYL CITRATE PF 50 MCG/ML IJ SOSY
50.0000 ug | PREFILLED_SYRINGE | INTRAMUSCULAR | Status: DC | PRN
  Administered 2021-11-21: 50 ug via INTRAVENOUS
  Filled 2021-11-21: qty 1

## 2021-11-21 MED ORDER — POTASSIUM CHLORIDE 20 MEQ PO PACK
40.0000 meq | PACK | Freq: Once | ORAL | Status: AC
  Administered 2021-11-21: 40 meq via ORAL
  Filled 2021-11-21: qty 2

## 2021-11-21 MED ORDER — HYDROMORPHONE HCL 1 MG/ML IJ SOLN: 0.2500 mg | Freq: Once | INTRAMUSCULAR | Status: DC

## 2021-11-21 MED ORDER — FUROSEMIDE 10 MG/ML IJ SOLN
120.0000 mg | Freq: Once | INTRAVENOUS | Status: AC
  Administered 2021-11-21: 120 mg via INTRAVENOUS
  Filled 2021-11-21: qty 10

## 2021-11-21 MED ORDER — KETAMINE HCL 50 MG/5ML IJ SOSY
PREFILLED_SYRINGE | INTRAMUSCULAR | Status: AC
  Filled 2021-11-21: qty 5

## 2021-11-21 MED ORDER — ETOMIDATE 2 MG/ML IV SOLN
INTRAVENOUS | Status: AC
  Administered 2021-11-21: 20 mg via INTRAVENOUS
  Filled 2021-11-21: qty 10

## 2021-11-21 MED ORDER — FENTANYL CITRATE PF 50 MCG/ML IJ SOSY: 100.0000 ug | PREFILLED_SYRINGE | Freq: Once | INTRAMUSCULAR | Status: AC

## 2021-11-21 MED ORDER — STERILE WATER FOR INJECTION IJ SOLN
INTRAMUSCULAR | Status: AC
  Filled 2021-11-21: qty 10

## 2021-11-21 MED ORDER — FENTANYL CITRATE PF 50 MCG/ML IJ SOSY
PREFILLED_SYRINGE | INTRAMUSCULAR | Status: AC
  Administered 2021-11-21: 100 ug via INTRAVENOUS
  Filled 2021-11-21: qty 2

## 2021-11-21 MED ORDER — ORAL CARE MOUTH RINSE
15.0000 mL | OROMUCOSAL | Status: DC | PRN
Start: 2021-11-21 — End: 2021-11-26

## 2021-11-21 MED ORDER — SODIUM BICARBONATE 650 MG PO TABS
1300.0000 mg | ORAL_TABLET | Freq: Three times a day (TID) | ORAL | Status: DC
  Administered 2021-11-21 – 2021-11-25 (×15): 1300 mg
  Filled 2021-11-21 (×15): qty 2

## 2021-11-21 MED ORDER — ETOMIDATE 2 MG/ML IV SOLN
INTRAVENOUS | Status: AC
  Filled 2021-11-21: qty 20

## 2021-11-21 MED ORDER — SODIUM BICARBONATE 8.4 % IV SOLN
INTRAVENOUS | Status: AC
  Filled 2021-11-21 (×2): qty 1000

## 2021-11-21 NOTE — Procedures (Signed)
Intubation Procedure Note  Kara [name]  [id number]  [birth year]  Date:11/21/21  Time:10:32 PM   Provider Performing:Scott Greggory Stallion, NP, supervised by Charlott Holler    Procedure: Intubation (31500)  Indication(s) Respiratory Failure  Consent Risks of the procedure as well as the alternatives and risks of each were explained to the patient and/or caregiver.  Consent for the procedure was obtained and is signed in the bedside chart   Anesthesia Etomidate, Fentanyl, and Rocuronium   Time Out Verified patient identification, verified procedure, site/side was marked, verified correct patient position, special equipment/implants available, medications/allergies/relevant history reviewed, required imaging and test results available.   Sterile Technique Usual hand hygeine, masks, and gloves were used   Procedure Description Patient positioned in bed supine.  Sedation given as noted above.  Patient was intubated with endotracheal tube using Glidescope.  View was Grade 1 full glottis .  Number of attempts was 1.  Colorimetric CO2 detector was consistent with tracheal placement.   Complications/Tolerance None; patient tolerated the procedure well. Chest X-ray is ordered to verify placement.   EBL Minimal   Specimen(s) None

## 2021-11-21 NOTE — Progress Notes (Signed)
RT attempted bipap with pt. But pt. Did not tolerate it.

## 2021-11-21 NOTE — Progress Notes (Signed)
eLink Physician-Brief Progress Note Patient Name: Kara [name] DOB: 09-15-1985 MRN: [id number]   Date of Service  11/21/2021  HPI/Events of Note  Patient with agitated delirium. She was on PRN low dose Dilaudid which seemed to suppress the delirium but it was discontinued earlier today due to drowsiness.Marland Kitchen  eICU Interventions  Will increase her Precedex ceiling to 1.2 and give her one dose of Dilaudid 0.25 mg iv.        Migdalia Dk 11/21/2021, 8:47 PM

## 2021-11-21 NOTE — Progress Notes (Signed)
eLink Physician-Brief Progress Note Patient Name: Kara [name] DOB: 06-23-1985 MRN: [id number]   Date of Service  11/21/2021  HPI/Events of Note  Patient with sub-optimal sedation on the ventilator.  eICU Interventions  Fentanyl changed to 50 mcg Q 1 hour PRN, and PRN indication for Versed expanded to include sedation.        Thomasene Lot Donya Tomaro 11/21/2021, 11:15 PM

## 2021-11-21 NOTE — Progress Notes (Signed)
NAME:  Kara [name], MRN:  [id number], DOB:  Aug 22, 1985, LOS: 8 ADMISSION DATE:  11/13/2021, CONSULTATION DATE:  7/28 REFERRING MD:  Dr. 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  DVT ultrasound negative.  Patient discharged with ABX on 11/07/2021.  During hospital course patient with low K and mag.  During the past few days per husband patient has been nauseous and not able to take her medications.  She has been taking medication for nausea.  On 7/28 in the afternoon, patient's husband states he went out and about 20 minutes later found patient down on floor with agonal respirations.  He called 911 and started CPR.  Fire dept/EMS arrived finding patient to be in VT.  Multiple shocks delivered, epi and amnio given.  About 15 minutes until ROSC was obtained by EMS arrival.  Patient intubated and was given sedation.  Patient transferred to [facility] ED.  Upon arrival to [facility] on 7/28, patient intubated and sedated.  Patient on epi drip.  EKG with no ST elevation but prolonged QTc.  Cardiology consulted.  CXR with no signs of pneumonia; ETT in good position.  Pertinent ED labs: UDS WNL, UA WNL, WBC 23.9, K2.7, glucose 290, AST 178, alk phos 321, troponin 111, ABG 7.24, 45, 156, 19.  CT head and PE chest pending.  Other labs and cultures pending.  Patient noted to be having some posturing and possible myoclonic activity.  Versed given.  EEG ordered.  PCCM consulted for ICU admission.   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 MRI  Interim History / Subjective:  Patient remains encephalopathic Moving all 4 extremities As soon as tube feed was started yesterday, patient started with loose watery diarrhea   Objective   Blood pressure 126/89, pulse 100, temperature 99.3 F (37.4 C), resp. rate (!) 27, height 5\' 7"  (1.702 m), weight 94.6 kg, SpO2 99 %.        Intake/Output Summary (Last 24 hours) at 11/21/2021 1314 Last data filed at 11/21/2021 1300 Gross per 24 hour  Intake 3816.33 ml  Output 885 ml  Net 2931.33 ml   Filed Weights   11/19/21 0334 11/20/21 0118 11/21/21 0500  Weight: 89.6 kg 96.6 kg 94.6 kg    Examination: Physical exam: General: Acutely ill-appearing female, lying on the bed HEENT: [place]/AT, eyes anicteric.  moist mucus membranes Neuro: Eyes closed, does not open, grimacing in pain, moving all 4 extremities, intermittently responding to voice Chest: Coarse breath sounds, no wheezes or rhonchi Heart: Tachycardic, regular rhythm, no murmurs or gallops Abdomen: Soft, nontender, nondistended, bowel sounds present Skin: No rash   Resolved Hospital Problem list   Hx of splenic vein thrombosis: previously on warfarin complicated by abdominal hematoma requiring IR intervention, not presently on warfarin Hyponatremia Postextubation stridor  Assessment & Plan:  VT/Vfib Cardiac arrest: patient unseen for about 20 minutes; found down pulseless; found to be in  VT shocked multiple times, given epi and amio; ROSC 15 after EMS arrived to seen. Per husband has been having N/V taking anti-nausea meds. Not taking her home meds that husband was aware of that past few days due to this.  UDS neg, alcohol neg. 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 MRI brain confirmed hypoxic/anoxic injury Appreciate neurology follow-up Patient was loaded with Vimpat, continue Vimpat twice daily Gabapentin was resumed Continue to hold Topamax  Postarrest encephalopathy versus opiate withdrawal Patient is restless and grimacing in the bed She had loose bowel movements She takes oxycodone at home for chronic pancreatitis Oxycodone was resumed Dilaudid has stopped  Acute respiratory failure with hypoxia Hx of asthma: On Breo-Ellipta, abuterol, and singulair, no bronchospasm on vent Remains on 4 L oxygen Continue to titrate with O2 sat goal 92% Continue scheduled Pulmicort and Brovana, as needed albuterol  Right lower extremity cellulitis Aspiration pneumonia Completed treatment with ceftriaxone  Chronic diastolic HF: last echo 2018 EF 60-65% Likely Type II NSTEMI Monitor intake and output  Chronic pain: Oxycodone at home Oxycodone was resumed  HTN/HLD Continue holding home spironolactone/Crestor  AKI due to ischemic ATN Serum creatinine started improving, down from 3.4-3.2 Continue gentle IV fluid hydration Monitor intake and output Avoid nephrotoxic agents  Shock liver LFTs are stable  Thrombocytopenia Platelet count continue to trend up.  Now it is at 160   Best Practice (right click and "Reselect all SmartList Selections" daily)   Diet/type: Probably patient will be started on TPN today DVT prophylaxis: Heparin GI prophylaxis: PPI Lines: n/A Foley: yes Code Status:  full code Last date of multidisciplinary goals of care discussion [8/5 husband and mother updated at bedside.]   Total critical care time: 32 minutes  Performed by: Cheri Fowler   Critical care time was exclusive of separately billable procedures and treating other patients.   Critical care was necessary to treat or prevent imminent or life-threatening  deterioration.   Critical care was time spent personally by me on the following activities: development of treatment plan with patient and/or surrogate as well as nursing, discussions with consultants, evaluation of patient's response to treatment, examination of patient, obtaining history from patient or surrogate, ordering and performing treatments and interventions, ordering and review of laboratory studies, ordering and review of radiographic studies, pulse oximetry and re-evaluation of patient's condition.   Cheri Fowler, MD [place] [facility] See Amion for pager If no response to pager, please call [phone] until 7pm After 7pm, Please call E-link [phone]

## 2021-11-21 NOTE — Progress Notes (Signed)
eLink Physician-Brief Progress Note Patient Name: Kara [name] DOB: 03-17-1986 MRN: [id number]   Date of Service  11/21/2021  HPI/Events of Note  Patient with metabolic acidosis. Needs review of CXR post-intubation.  eICU Interventions  One amp of Bicarb iv bolus followed by Bicarb gtt at 50 ml / hour ordered, respiratory rate on the ventilator increased to 20 to optimize respiratory compensation. CXR reviewed and shows pulmonary edema, Lasix ordered.        Thomasene Lot [name] 11/21/2021, 11:44 PM

## 2021-11-21 NOTE — Progress Notes (Signed)
CCM progress note:  Asked to see patient for respiratory distress and agitated delirium. Patient is on 15L high flow with tachypnia and tachycardia. Chest xray shows acute pulmonary edema. She is 14L positive. Tried conservative measures including anti-psychotics, precedex, bipap. Decision made to intubate. Discussed with spouse on the phone who agreed. IVF stopped. Lasix ordered. Maintain LTVV 6 cc/kg. Repeat ABG one hour. Prn fentanyl ordered  Discussed with bedside care team.   Additional CC time 45 minutes irrespective of procedures for this critically ill patient with encephalopathy and respiratory failure.   Durel Salts, MD Pulmonary and Critical Care Medicine [facility] 11/21/2021 10:37 PM Pager: see AMION  If no response to pager, please call critical care on call (see AMION) until 7pm After 7:00 pm call Elink

## 2021-11-21 NOTE — Progress Notes (Signed)
eLink Physician-Brief Progress Note Patient Name: Kara [name] DOB: 1985/08/20 MRN: [id number]   Date of Service  11/21/2021  HPI/Events of Note  Persistent agitated hyper delirium with desaturation. Patient did not tolerate attempt at placing BIPAP on her.  eICU Interventions  CXR, ABG, Zyprexa  10 mg iv x  1, PCCM ground crew asked to evaluate patient for possible intubation.         Migdalia Dk 11/21/2021, 9:42 PM

## 2021-11-21 NOTE — Progress Notes (Signed)
Speech Language Pathology Treatment: Dysphagia  Patient Details Name: Kara [name] MRN: [id number] DOB: 11/21/1985 Today's Date: 11/21/2021 Time: 0950-1000 SLP Time Calculation (min) (ACUTE ONLY): 10 min  Assessment / Plan / Recommendation Clinical Impression  SLP followed up for PO trials. A cognitive based dysphagia persists presently. SLP provided diligent oral care. With maximal multimodal cueing, pt exhibited some facial expressions to touch though did not open eyes. Mother reports recent pain medicine administered possibly contributing to pts reduced mentation and that pt struggles with chronic pain with her pancreatitis. Pt with some reflexive mastication during single small ice chip trial however no palpable swallow appreciated this date. No further PO trials given. Continued to provide education to pts mother regarding SLP role in dysphagia intervention. Will continue to follow.     HPI HPI: Patient is a 36 year old female who presented to [facility] ED on 7/28 s/p cardiac arrest. Fire dept/EMS arrived finding patient to be in [place].  Multiple shocks delivered, epi and amnio given.  About 15 minutes until ROSC was obtained by EMS arrival.  MRI 8/2 "showed restricted diffusion in bilat posterior frontal, parietal, and superior occipital cortex c/w cytotoxic edema from hypoxic ischemic injury. 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      SLP Plan  Continue with current plan of care      Recommendations for follow up therapy are one component of a multi-disciplinary discharge planning process, led by the attending physician.  Recommendations may be updated based on patient status, additional functional criteria and insurance authorization.    Recommendations  Diet recommendations: NPO;Other(comment) (ice chips if alert enough with skilled  caregvier) Medication Administration: Via alternative means                Follow Up Recommendations: Skilled nursing-short term rehab (<3 hours/day) Assistance recommended at discharge: Frequent or constant Supervision/Assistance SLP Visit Diagnosis: Dysphagia, unspecified (R13.10) Plan: Continue with current plan of care           Izaih Kataoka H. MA, CCC-SLP Acute Rehabilitation Services    11/21/2021, 10:00 AM

## 2021-11-21 NOTE — Progress Notes (Signed)
Neurology Progress Note  Subjective: Patient much more alert today, somewhat interactive and responds appropriately to some verbal and physical stimuli (looked at her husband and called him "babe" her usual nickname for her, asked the intensivist to stop pinching her during neurologic assessment because it hurts).  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 Subcortical nuclei are spared. Personally reviewed.  Exam: Vitals:   11/21/21 1500 11/21/21 1600  BP: 110/67 101/64  Pulse: (!) 110 88  Resp: (!) 25 (!) 25  Temp: 99.5 F (37.5 C) 99.3 F (37.4 C)  SpO2: 98% 99%   Gen: asleep but arousable, remains drowsy, more interactive, follows simple commands in all extremities today Resp: normal WOB CV: RRR  Neuro: MS: asleep but arousable, remains drowsy, more interactive, follows simple commands in all extremities today, oriented to first name only Speech: mild dysarthria CN: PERRL, (+) corneals, oculocephalics, face symmetric at rest, hearing intact to voice Motor: some movement against gravity BUE and wiggles toes BLE all on command (symmetric) Sensory: SILT Reflexes: 1+ symm throughout, toes not tested 2/2 boots  Pertinent Labs: Creatinine 2.66 Lab Results  Component Value Date   WBC 8.3 11/21/2021   HGB 9.2 (L) 11/21/2021   HCT 28.6 (L) 11/21/2021   MCV 101.1 (H) 11/21/2021   PLT 160 11/21/2021   CMP     Component Value Date/Time   NA 144 11/21/2021 1513   K 3.8 11/21/2021 1513   CL 116 (H) 11/21/2021 1513   CO2 14 (L) 11/21/2021 1513   GLUCOSE 110 (H) 11/21/2021 1513   BUN 43 (H) 11/21/2021 1513   CREATININE 3.01 (H) 11/21/2021 1513   CALCIUM 9.2 11/21/2021 1513   PROT 6.5 11/20/2021 0634   ALBUMIN 2.4 (L) 11/20/2021 0634   AST 91 (H) 11/20/2021 0634   ALT 38 11/20/2021 0634   ALKPHOS 397 (H) 11/20/2021 0634   BILITOT 3.1 (H)  11/20/2021 0634   GFRNONAA 20 (L) 11/21/2021 1513   Imaging:  EEG overnight 7/31 - 8/1:  "This study is suggestive of moderate to severe diffuse encephalopathy, nonspecific etiology. No seizures or epileptiform discharges were seen throughout the recording."  EEG 8/1-8/2: This study is suggestive of moderate to severe diffuse encephalopathy, nonspecific etiology. No seizures or epileptiform discharges were seen throughout the recording  Impression: 36 year old female with a history of seizures who presented s/p cardiac arrest, presumably due to prolonged QTc. She was previously on topiramate for seizures and gabapentin for neuropathic pain, neither of which were felt to contribute to her cardiac arrest.   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 Subcortical nuclei are spared. Personally reviewed.  cEEG x48 hrs showed moderate DS no epileptiform abnl  Patient was extubated 2 days ago and is doing remarkably well. She is still drowsy but moves all extremities on command and interacts appropriately with family at times. I have had numerous conversations with patient's mother and husband that her MRI did show evidence of hypoxic ischemic injury and that in all likelihood Lanina would not return to her former full cognitive and neurologic function.  At this point it is too early to tell exactly how much she will recover and what deficits may remain. It will be months before that is clarified, and I would expect she might see improvement for up to 2 yrs.  Her youth is in her favor and she has recovered remarkably well in a short period of time particularly given the clear e/o anoxic injury on her MRI. She still has significant deficits but the amount of recovery she has displayed this early is a favorable sign. She should start aggressive PT/OT/SLP as soon as she is able to  participate.   Patient was transitioned from vimpat to topiramate when she was unable to take po and has done well on vimpat. We will leave her on this regimen since topiramate can cause cognitive impairment that would potentially mask her recovery and hamper her progress in rehab. She is on low dose gabapentin which per family has been essential for comfort in the past, and we will continue that since she seems to be waking up more every day.  Recommendations: - Continue vimpat 100mg  IV q 12 hrs - Continue home dose gabapentin, consider decreasing dose if patient remains lethargic - Topiramate d/c'd, will not restart - Aggressive PT/OT/SLP as soon as patient is able to participate  No further neurologic workup indicated at this time. Neurology will not continue to actively follow, but please re-engage if new neurologic concerns arise.  This patient is critically ill and at significant risk of neurological worsening, death and care requires constant monitoring of vital signs, hemodynamics,respiratory and cardiac monitoring, neurological assessment, discussion with family, other specialists and medical decision making of high complexity. I spent 75 minutes of neurocritical care time  in the care of  this patient. This was time spent independent of any time provided by nurse practitioner or PA.  , MD [facility] [phone]  If 7pm- 7am, please page neurology on call as listed in AMION.

## 2021-11-22 ENCOUNTER — Inpatient Hospital Stay: Payer: Self-pay

## 2021-11-22 ENCOUNTER — Inpatient Hospital Stay (HOSPITAL_COMMUNITY): Payer: BC Managed Care – PPO

## 2021-11-22 DIAGNOSIS — J9601 Acute respiratory failure with hypoxia: Secondary | ICD-10-CM | POA: Diagnosis not present

## 2021-11-22 DIAGNOSIS — I469 Cardiac arrest, cause unspecified: Secondary | ICD-10-CM | POA: Diagnosis not present

## 2021-11-22 DIAGNOSIS — E876 Hypokalemia: Secondary | ICD-10-CM | POA: Diagnosis not present

## 2021-11-22 DIAGNOSIS — E872 Acidosis, unspecified: Secondary | ICD-10-CM | POA: Diagnosis not present

## 2021-11-22 DIAGNOSIS — G9340 Encephalopathy, unspecified: Secondary | ICD-10-CM | POA: Diagnosis not present

## 2021-11-22 LAB — POCT I-STAT 7, (LYTES, BLD GAS, ICA,H+H)
Acid-base deficit: 7 mmol/L — ABNORMAL HIGH (ref 0.0–2.0)
Bicarbonate: 17.7 mmol/L — ABNORMAL LOW (ref 20.0–28.0)
Calcium, Ion: 1.3 mmol/L (ref 1.15–1.40)
HCT: 28 % — ABNORMAL LOW (ref 36.0–46.0)
Hemoglobin: 9.5 g/dL — ABNORMAL LOW (ref 12.0–15.0)
O2 Saturation: 100 %
Patient temperature: 36.9
Potassium: 3.3 mmol/L — ABNORMAL LOW (ref 3.5–5.1)
Sodium: 146 mmol/L — ABNORMAL HIGH (ref 135–145)
TCO2: 19 mmol/L — ABNORMAL LOW (ref 22–32)
pCO2 arterial: 32.3 mmHg (ref 32–48)
pH, Arterial: 7.348 — ABNORMAL LOW (ref 7.35–7.45)
pO2, Arterial: 230 mmHg — ABNORMAL HIGH (ref 83–108)

## 2021-11-22 LAB — BASIC METABOLIC PANEL
Anion gap: 15 (ref 5–15)
BUN: 42 mg/dL — ABNORMAL HIGH (ref 6–20)
CO2: 17 mmol/L — ABNORMAL LOW (ref 22–32)
Calcium: 8.8 mg/dL — ABNORMAL LOW (ref 8.9–10.3)
Chloride: 111 mmol/L (ref 98–111)
Creatinine, Ser: 2.77 mg/dL — ABNORMAL HIGH (ref 0.44–1.00)
GFR, Estimated: 22 mL/min — ABNORMAL LOW (ref >60)
Glucose, Bld: 128 mg/dL — ABNORMAL HIGH (ref 70–99)
Potassium: 3.2 mmol/L — ABNORMAL LOW (ref 3.5–5.1)
Sodium: 143 mmol/L (ref 135–145)

## 2021-11-22 LAB — CBC
HCT: 29.6 % — ABNORMAL LOW (ref 36.0–46.0)
Hemoglobin: 9.3 g/dL — ABNORMAL LOW (ref 12.0–15.0)
MCH: 31.8 pg (ref 26.0–34.0)
MCHC: 31.4 g/dL (ref 30.0–36.0)
MCV: 101.4 fL — ABNORMAL HIGH (ref 80.0–100.0)
Platelets: 192 10*3/uL (ref 150–400)
RBC: 2.92 MIL/uL — ABNORMAL LOW (ref 3.87–5.11)
RDW: 15.4 % (ref 11.5–15.5)
WBC: 9.3 10*3/uL (ref 4.0–10.5)
nRBC: 0 % (ref 0.0–0.2)

## 2021-11-22 LAB — GLUCOSE, CAPILLARY
Glucose-Capillary: 130 mg/dL — ABNORMAL HIGH (ref 70–99)
Glucose-Capillary: 119 mg/dL — ABNORMAL HIGH (ref 70–99)
Glucose-Capillary: 138 mg/dL — ABNORMAL HIGH (ref 70–99)
Glucose-Capillary: 152 mg/dL — ABNORMAL HIGH (ref 70–99)
Glucose-Capillary: 138 mg/dL — ABNORMAL HIGH (ref 70–99)
Glucose-Capillary: 148 mg/dL — ABNORMAL HIGH (ref 70–99)

## 2021-11-22 LAB — POTASSIUM: Potassium: 3.5 mmol/L (ref 3.5–5.1)

## 2021-11-22 LAB — MAGNESIUM
Magnesium: 1.7 mg/dL (ref 1.7–2.4)
Magnesium: 2.5 mg/dL — ABNORMAL HIGH (ref 1.7–2.4)

## 2021-11-22 MED ORDER — ORAL CARE MOUTH RINSE
15.0000 mL | OROMUCOSAL | Status: DC
  Administered 2021-11-22 – 2021-11-26 (×49): 15 mL via OROMUCOSAL

## 2021-11-22 MED ORDER — FENTANYL CITRATE PF 50 MCG/ML IJ SOSY
25.0000 ug | PREFILLED_SYRINGE | INTRAMUSCULAR | Status: DC | PRN
  Administered 2021-11-22 – 2021-11-23 (×2): 25 ug via INTRAVENOUS
  Filled 2021-11-22 (×2): qty 1

## 2021-11-22 MED ORDER — CLONAZEPAM 1 MG PO TABS
1.0000 mg | ORAL_TABLET | Freq: Two times a day (BID) | ORAL | Status: DC
  Administered 2021-11-22 – 2021-11-23 (×3): 1 mg via ORAL
  Filled 2021-11-22 (×3): qty 1

## 2021-11-22 MED ORDER — MIDAZOLAM HCL 2 MG/2ML IJ SOLN: 5.0000 mg | Freq: Once | INTRAMUSCULAR | Status: AC

## 2021-11-22 MED ORDER — MAGNESIUM SULFATE 2 GM/50ML IV SOLN
2.0000 g | Freq: Once | INTRAVENOUS | Status: AC
  Administered 2021-11-22: 2 g via INTRAVENOUS
  Filled 2021-11-22: qty 50

## 2021-11-22 MED ORDER — SODIUM CHLORIDE 0.9% FLUSH
10.0000 mL | Freq: Two times a day (BID) | INTRAVENOUS | Status: DC
  Administered 2021-11-22: 20 mL
  Administered 2021-11-23 – 2021-11-26 (×7): 10 mL

## 2021-11-22 MED ORDER — MAGNESIUM SULFATE 2 GM/50ML IV SOLN
2.0000 g | Freq: Once | INTRAVENOUS | Status: AC
Start: 2021-11-22 — End: 2021-11-22
  Administered 2021-11-22: 2 g via INTRAVENOUS
  Filled 2021-11-22: qty 50

## 2021-11-22 MED ORDER — FUROSEMIDE 10 MG/ML IJ SOLN
40.0000 mg | Freq: Once | INTRAMUSCULAR | Status: AC
  Administered 2021-11-22: 40 mg via INTRAVENOUS
  Filled 2021-11-22: qty 4

## 2021-11-22 MED ORDER — POTASSIUM CHLORIDE 10 MEQ/100ML IV SOLN
10.0000 meq | INTRAVENOUS | Status: AC
  Administered 2021-11-22 (×4): 10 meq via INTRAVENOUS
  Filled 2021-11-22 (×4): qty 100

## 2021-11-22 MED ORDER — SODIUM CHLORIDE 0.9% FLUSH: 10.0000 mL | INTRAVENOUS | Status: DC | PRN

## 2021-11-22 MED ORDER — LIDOCAINE-EPINEPHRINE (PF) 2 %-1:200000 IJ SOLN
INTRAMUSCULAR | Status: AC
  Filled 2021-11-22: qty 20

## 2021-11-22 MED ORDER — ROCURONIUM BROMIDE 50 MG/5ML IV SOLN
100.0000 mg | Freq: Once | INTRAVENOUS | Status: AC
Start: 2021-11-22 — End: 2021-11-22
  Filled 2021-11-22: qty 10

## 2021-11-22 MED ORDER — ROCURONIUM BROMIDE 10 MG/ML (PF) SYRINGE
PREFILLED_SYRINGE | INTRAVENOUS | Status: AC
  Administered 2021-11-22: 100 mg via INTRAVENOUS
  Filled 2021-11-22: qty 10

## 2021-11-22 MED ORDER — FENTANYL CITRATE PF 50 MCG/ML IJ SOSY: 200.0000 ug | PREFILLED_SYRINGE | Freq: Once | INTRAMUSCULAR | Status: AC

## 2021-11-22 MED ORDER — MIDAZOLAM HCL 2 MG/2ML IJ SOLN
INTRAMUSCULAR | Status: AC
  Administered 2021-11-22: 5 mg via INTRAVENOUS
  Filled 2021-11-22: qty 6

## 2021-11-22 MED ORDER — ETOMIDATE 2 MG/ML IV SOLN
20.0000 mg | Freq: Once | INTRAVENOUS | Status: AC
Start: 2021-11-22 — End: 2021-11-22

## 2021-11-22 MED ORDER — NOREPINEPHRINE 4 MG/250ML-% IV SOLN
2.0000 ug/min | INTRAVENOUS | Status: DC
  Administered 2021-11-22: 2 ug/min via INTRAVENOUS
  Filled 2021-11-22: qty 250

## 2021-11-22 MED ORDER — ETOMIDATE 2 MG/ML IV SOLN
INTRAVENOUS | Status: AC
  Administered 2021-11-22: 20 mg via INTRAVENOUS
  Filled 2021-11-22: qty 10

## 2021-11-22 MED ORDER — POTASSIUM CHLORIDE 10 MEQ/50ML IV SOLN
10.0000 meq | INTRAVENOUS | Status: AC
  Administered 2021-11-22 (×4): 10 meq via INTRAVENOUS
  Filled 2021-11-22 (×4): qty 50

## 2021-11-22 MED ORDER — SODIUM CHLORIDE 0.9 % IV SOLN
250.0000 mL | INTRAVENOUS | Status: DC
  Administered 2021-11-22: 10 mL via INTRAVENOUS
  Administered 2021-11-22: 250 mL via INTRAVENOUS

## 2021-11-22 MED ORDER — POTASSIUM CHLORIDE 20 MEQ PO PACK
40.0000 meq | PACK | Freq: Two times a day (BID) | ORAL | Status: DC
  Administered 2021-11-22: 40 meq
  Filled 2021-11-22: qty 2

## 2021-11-22 MED ORDER — FENTANYL CITRATE PF 50 MCG/ML IJ SOSY
PREFILLED_SYRINGE | INTRAMUSCULAR | Status: AC
  Administered 2021-11-22: 200 ug via INTRAVENOUS
  Filled 2021-11-22: qty 4

## 2021-11-22 NOTE — IPAL (Signed)
Interdisciplinary Goals of Care Family Meeting   Date carried out:: 11/22/2021  Location of the meeting: Bedside  Member's involved: Physician, Bedside Registered Nurse, and Family Member or next of kin  Durable Power of Attorney or acting medical decision maker: Kara [name]  Discussion: We discussed goals of care for Kara [name] .    The Clinical status was relayed to Kara [name] and Kara [name] at bedside in detail.   Updated and notified of patients medical condition.     Patient remains unresponsive and will not open eyes to command.   Unfortunately she required intubation because of altered mental status and increased work of breathing, she will need long-term therapy before we know what will be Kara baseline considering she has anoxic brain injury.  Kara [name] and Kara [name] understand that it will be a long process, they would like to proceed with tracheostomy  Code status: Full Code  Disposition: Continue current acute care    Family are satisfied with Plan of action and management. All questions answered   Kara Fowler MD [facility] See Amion for pager If no response to pager, please call [phone] until 7pm After 7pm, Please call E-link [phone]

## 2021-11-22 NOTE — Progress Notes (Signed)
An USGPIV (ultrasound guided PIV) has been placed for short-term vasopressor infusion. A correctly placed ivWatch must be used when administering Vasopressors. Should this treatment be needed beyond 72 hours, central line access should be obtained.  It will be the responsibility of the bedside nurse to follow best practice to prevent extravasations.   ?

## 2021-11-22 NOTE — Progress Notes (Signed)
Peripherally Inserted Central Catheter Placement  The IV Nurse has discussed with the patient and/or persons authorized to consent for the patient, the purpose of this procedure and the potential benefits and risks involved with this procedure.  The benefits include less needle sticks, lab draws from the catheter, and the patient may be discharged home with the catheter. Risks include, but not limited to, infection, bleeding, blood clot (thrombus formation), and puncture of an artery; nerve damage and irregular heartbeat and possibility to perform a PICC exchange if needed/ordered by physician.  Alternatives to this procedure were also discussed.  Bard Power PICC patient education guide, fact sheet on infection prevention and patient information card has been provided to patient /or left at bedside.    PICC Placement Documentation  PICC Triple Lumen 11/22/21 Right Brachial 38 cm 2 cm (Active)  Indication for Insertion or Continuance of Line Vasoactive infusions;Administration of hyperosmolar/irritating solutions (i.e. TPN, Vancomycin, etc.);Prolonged intravenous therapies;Limited venous access - need for IV therapy >5 days (PICC only) 11/22/21 1258  Exposed Catheter (cm) 2 cm 11/22/21 1258  Site Assessment Clean, Dry, Intact 11/22/21 1258  Lumen #1 Status Flushed;Saline locked;Blood return noted 11/22/21 1258  Lumen #2 Status Flushed;Saline locked;Blood return noted 11/22/21 1258  Lumen #3 Status Flushed;Saline locked;Blood return noted 11/22/21 1258  Dressing Type Transparent;Securing device 11/22/21 1258  Dressing Status Antimicrobial disc in place;Clean, Dry, Intact 11/22/21 1258  Safety Lock Not Applicable 11/22/21 1258  Line Care Connections checked and tightened 11/22/21 1258  Line Adjustment (NICU/IV Team Only) No 11/22/21 1258  Dressing Intervention New dressing 11/22/21 1258  Dressing Change Due 11/29/21 11/22/21 1258       Elliot Dally 11/22/2021, 12:59 PM

## 2021-11-22 NOTE — Procedures (Signed)
Diagnostic Bronchoscopy  Lewis Grivas  [id number]  February 03, 1986  Date:11/22/21  Time:2:45 PM   Provider Performing:Teegan Guinther   Procedure: Diagnostic Bronchoscopy (35573)  Indication(s) Assist with direct visualization of tracheostomy placement  Consent Risks of the procedure as well as the alternatives and risks of each were explained to the patient and/or caregiver.  Consent for the procedure was obtained.   Anesthesia See separate tracheostomy note   Time Out Verified patient identification, verified procedure, site/side was marked, verified correct patient position, special equipment/implants available, medications/allergies/relevant history reviewed, required imaging and test results available.   Sterile Technique Usual hand hygiene, masks, gowns, and gloves were used   Procedure Description Bronchoscope advanced through endotracheal tube and into airway.  After suctioning out tracheal secretions, bronchoscope used to provide direct visualization of tracheostomy placement.      Complications/Tolerance None; patient tolerated the procedure well.   EBL None  Specimen(s) None

## 2021-11-22 NOTE — Progress Notes (Signed)
NAME:  Kara [name], MRN:  [id number], DOB:  1985/08/31, LOS: 9 ADMISSION DATE:  11/13/2021, CONSULTATION DATE:  7/28 REFERRING MD:  Dr. 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  DVT ultrasound negative.  Patient discharged with ABX on 11/07/2021.  During hospital course patient with low K and mag.  During the past few days per husband patient has been nauseous and not able to take her medications.  She has been taking medication for nausea.  On 7/28 in the afternoon, patient's husband states he went out and about 20 minutes later found patient down on floor with agonal respirations.  He called 911 and started CPR.  Fire dept/EMS arrived finding patient to be in VT.  Multiple shocks delivered, epi and amnio given.  About 15 minutes until ROSC was obtained by EMS arrival.  Patient intubated and was given sedation.  Patient transferred to [facility] ED.  Upon arrival to [facility] on 7/28, patient intubated and sedated.  Patient on epi drip.  EKG with no ST elevation but prolonged QTc.  Cardiology consulted.  CXR with no signs of pneumonia; ETT in good position.  Pertinent ED labs: UDS WNL, UA WNL, WBC 23.9, K2.7, glucose 290, AST 178, alk phos 321, troponin 111, ABG 7.24, 45, 156, 19.  CT head and PE chest pending.  Other labs and cultures pending.  Patient noted to be having some posturing and possible myoclonic activity.  Versed given.  EEG ordered.  PCCM consulted for ICU admission.   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 MRI  Interim History / Subjective:  Overnight patient became agitated and restless, she received Dilaudid and Versed without much help Then she started desatting, was placed on high flow nasal cannula without much improvement, trial of BiPAP was given then ultimately she was intubated X-ray chest was suggestive of diffuse pulmonary edema, she received Lasix with improvement   Objective   Blood pressure 106/71, pulse 78, temperature 99 F (37.2 C), resp. rate 19, height 5\' 7"  (1.702 m), weight 101.4 kg, SpO2 100 %.    Vent Mode: PRVC FiO2 (%):  [30 %-60 %] 30 % Set Rate:  [18 bmp-20 bmp] 20 bmp Vt Set:  [490 mL] 490 mL PEEP:  [5 cmH20] 5 cmH20 Plateau Pressure:  [16 cmH20-22 cmH20] 16 cmH20   Intake/Output Summary (Last 24 hours) at 11/22/2021 0944 Last data filed at 11/22/2021 0900 Gross per 24 hour  Intake 2750.98 ml  Output 3245 ml  Net -494.02 ml   Filed Weights   11/20/21 0118 11/21/21 0500 11/22/21 0400  Weight: 96.6 kg 94.6 kg 101.4 kg    Examination:   Physical exam: General: Crtitically ill-appearing obese female, orally intubated HEENT: [place]/AT, eyes anicteric.  ETT and OGT in place Neuro: Sedated, not following commands.  Eyes are closed.  Pupils 3 mm bilateral reactive to light Chest: Bilateral basal crackles, no wheezes or  rhonchi Heart: Regular rate and rhythm, no murmurs or gallops Abdomen: Soft, nontender, nondistended, bowel sounds present Skin: No rash   Resolved Hospital Problem list   Hx of splenic vein thrombosis: previously on warfarin complicated by abdominal hematoma requiring IR intervention, not presently on warfarin Hyponatremia Postextubation stridor Aspiration pneumonia Right lower leg cellulitis Thrombocytopenia due to acute illness  Assessment & Plan:   VT/Vfib Cardiac arrest: patient unseen for about 20 minutes; found down pulseless; found to be in VT shocked multiple times, given epi and amio; ROSC 15 after EMS arrival. Per husband has been having N/V taking anti-nausea meds. Not taking her home meds that husband was aware of that past few days due to this.  UDS neg, alcohol neg. Hx of long qt syndrome Hypokalemia Continue supportive care Avoid QT prolonging meds Continue aggressive electrolyte supplement, this morning serum potassium was 3.2 Supplement and repeat in the morning  Seizure disorder Anoxic brain injury secondary to cardiac arrest MRI brain confirmed hypoxic/anoxic injury Appreciate neurology follow-up Patient was loaded with Vimpat, continue Vimpat twice daily Continue gabapentin Continue to hold Topamax  Postarrest encephalopathy versus opiate withdrawal Patient is restless and and agitated She had loose bowel movements She takes oxycodone at home for chronic pancreatitis Oxycodone was resumed  Acute respiratory failure with hypoxia Pulmonary edema Hx of asthma: On Breo-Ellipta, abuterol, and singulair, no bronchospasm on vent She was reintubated overnight due to volume overload from AKI After discussion with patient's family, we will proceed with tracheostomy Continue to titrate with O2 sat goal 92% Continue scheduled Pulmicort and Brovana, as needed albuterol She received Lasix 120 mg x 1 last night, will give her 40 mg this morning  Right lower extremity cellulitis Aspiration pneumonia Completed treatment with ceftriaxone  Chronic pain: Oxycodone at home Oxycodone was resumed  HTN/HLD Continue holding home spironolactone/Crestor  AKI due to ischemic ATN Serum creatinine started improving, down from 3.4-2.7 Stop IV fluid Monitor intake and output Avoid nephrotoxic agents  Shock liver LFTs are stable  Best Practice (right click and "Reselect all SmartList Selections" daily)   Diet/type:  Probably patient will be started on TPN today DVT prophylaxis: Heparin GI prophylaxis: PPI Lines: n/A Foley: yes Code Status:  full code Last date of multidisciplinary goals of care discussion [8/5 husband and mother updated at bedside.]   Total critical care time: 36 minutes  Performed by: Cheri Fowler   Critical care time was exclusive of separately billable procedures and treating other patients.   Critical care was necessary to treat or prevent imminent or life-threatening deterioration.   Critical care was time spent personally by me on the following activities: development of treatment plan with patient and/or surrogate as well as nursing, discussions with consultants, evaluation of patient's response to treatment, examination of patient, obtaining history from patient or surrogate, ordering and performing treatments and interventions, ordering and review of laboratory studies, ordering and review of radiographic studies, pulse oximetry and re-evaluation of patient's condition.   Cheri Fowler, MD [facility] See Amion for pager If no response to pager, please call [phone] until 7pm After 7pm, Please call E-link [phone]

## 2021-11-22 NOTE — Progress Notes (Signed)
PHARMACY - TOTAL PARENTERAL NUTRITION CONSULT NOTE   Indication: Tube feed intolerance   Patient Measurements: Height: 5\' 7"  (170.2 cm) Weight: 101.4 kg (223 lb 8.7 oz) IBW/kg (Calculated) : 61.6   Body mass index is 35.01 kg/m. Usual Weight: 200 lbs per chart   Assessment:  36 yo W with with VT/Vfib cardiac arrest s/p about 20 minutes of CPR before ROSC with anoxic brain injury. Per family, patient had  chronic NVD and labile food tolerance prior to admission (she could tolerate some foods but others would send her to the bathroom for 1 hour or cause vomiting). Unclear if food intolerance is due to chronic pancreatitis or other cause. Since admission, tried vital high protein, vital AF 1.2 cal, and vital 1.5 cal at different rates but patient continued to have vomiting and diarrhea despite low rates. Now with rectal tube. Tube feeds discontinued 8/4. Pharmacy consulted for TPN.   Glucose / Insulin: BG < 180, A1C 4.5, received 5 units SSI/24 hrs Electrolytes: K 3.5 (s/p K 40 IV + 40 PT BID), Mg 2.5 (s/p Mg 4g), CO2 17> bicarb gtt Renal: Scr 2.77 (BL 1); s/p Furosemide IV  Hepatic:  Intake / Output; MIVF: off LR @ 100. UOP 0.9 ml/kg/hr. FMS 10/4, net + 13.3 L  GI Imaging: 7/28 KUB: Dilated common bile duct with possible choledocholithiasis 7/29 KUB: Relative paucity of bowel gas in the visualized abdomen. 7/31 KUB: Nonobstructive bowel gas pattern. 8/1 KUB: Increasing gaseous distention of small bowel and right colon, nonspecific GI Surgeries / Procedures: none  Central access: 8/6  TPN start date: 8/7   Nutritional Goals: Goal TPN rate is 80 mL/hr (provides 102 g (53 g/L) of protein, 298 g dextrose (15.5%), 63 g lipids (33 g/L) and 2053 kcals per day  RD Assessment: Estimated Needs Total Energy Estimated Needs: 2000-2200 Total Protein Estimated Needs: 100-115 grams Total Fluid Estimated Needs: >/= 2 L  Current Nutrition:  NPO  Plan:  Will start TPN on 8/7 since consult  was received after TPN order due time on 8/6    10/6, PharmD, BCPS, [facility] Clinical Pharmacist  Please check AMION for all [facility] Pharmacy phone numbers After 10:00 PM, call Main Pharmacy [phone]

## 2021-11-22 NOTE — Progress Notes (Signed)
eLink Physician-Brief Progress Note Patient Name: Kara [name] DOB: 08-09-1985 MRN: [id number]   Date of Service  11/22/2021  HPI/Events of Note  Soft blood pressure.   eICU Interventions  Peripheral Norepinephrine gtt ordered, ABG check to follow up on metabolic acidosis.        Thomasene Lot [name] 11/22/2021, 4:28 AM

## 2021-11-22 NOTE — Progress Notes (Addendum)
eLink Physician-Brief Progress Note Patient Name: Kara [name] DOB: 10-09-1985 MRN: [id number]   Date of Service  11/22/2021  HPI/Events of Note  Discussed with Kara Ha, RN.  Camera eval done for agitation, pain and s/p new trach status and bronch from today.    eICU Interventions  - fentanyl 25 mcg prn for pain and agitation. -Prolonged qtc from admit only. K level low. Cr > 2. Making urine, on diuresis, Mag 2.5. do not have replacements.  Kcl 10 meq q1 hr x 4. DC ed scheduled K dur. Follow labs in AM - watch for worsening hypotension while on sedation.       Intervention Category Intermediate Interventions: Pain - evaluation and management;Electrolyte abnormality - evaluation and management Minor Interventions: Agitation / anxiety - evaluation and management  Kara [name] 11/22/2021, 7:50 PM  5:33 AM K 3.6, Cr > 2  Kcl replacement ordered. Goal to keep > 4, has qtc prolongation. Mag level > 2.

## 2021-11-22 NOTE — Progress Notes (Signed)
Nutrition Note  RD received page from RN.  Per RN, pt has not been able to tolerate tube feedings over the weekend. Having N/V/diarrhea and had rectal tube placed.  At most was able to have feeds infused at 20 ml/hr for 2 hours and then had to stop.  Pt expected to have trach today.   Patient is currently intubated on ventilator support MV: 9.9 L/min Temp (24hrs), Avg:98.6 F (37 C), Min:98.1 F (36.7 C), Max:99.7 F (37.6 C)   Pt not tolerating EN, recommended TPN be initiated. MD agreed to have PICC placed and TPN initiation.  Estimated needs from RD note 8/4: Kcal:  2000-2200 Protein:  100-115 grams Fluid:  >/= 2 L  Pt will likely be at refeeding syndrome risk  If other nutrition issues arise, please consult RD. Will continue to monitor.  Tilda Franco, MS, RD, LDN Inpatient Clinical Dietitian Contact information available via Amion

## 2021-11-22 NOTE — Procedures (Signed)
Percutaneous Tracheostomy Procedure Note   Nettye Flegal  [id number]  [birth year]  Date:11/22/21  Time:2:39 PM   Provider Performing:Jaquisha Frech  Procedure: Percutaneous Tracheostomy with Bronchoscopic Guidance (41962)  Indication(s) Acute respiratory failure  Consent Risks of the procedure as well as the alternatives and risks of each were explained to the patient and/or caregiver.  Consent for the procedure was obtained.  Anesthesia Etomidate, Versed, Fentanyl, Vecuronium   Time Out Verified patient identification, verified procedure, site/side was marked, verified correct patient position, special equipment/implants available, medications/allergies/relevant history reviewed, required imaging and test results available.   Sterile Technique Maximal sterile technique including sterile barrier drape, hand hygiene, sterile gown, sterile gloves, mask, hair covering.    Procedure Description Appropriate anatomy identified by palpation.  Patient's neck prepped and draped in sterile fashion.  1% lidocaine with epinephrine was used to anesthetize skin overlying neck.  1.5cm incision made and blunt dissection performed until tracheal rings could be easily palpated.   Then a size 6 Shiley tracheostomy was placed under bronchoscopic visualization using usual Seldinger technique and serial dilation.   Bronchoscope confirmed placement above the carina.  Tracheostomy was sutured in place with adhesive pad to protect skin under pressure.    Patient connected to ventilator.   Complications/Tolerance None; patient tolerated the procedure well. Chest X-ray is ordered to confirm no post-procedural complication.   EBL Minimal   Specimen(s) None

## 2021-11-22 NOTE — Procedures (Signed)
Bedside Tracheostomy Insertion Procedure Note   Patient Details:   Name: Kara [name] DOB: May 17, 1985 MRN: [id number]  Procedure: Tracheostomy  Pre Procedure Assessment: ET Tube Size: ET Tube secured at lip (cm): Bite block in place: No Breath Sounds: Rhonch  Post Procedure Assessment: BP (!) 86/56   Pulse 87   Temp (!) 100.9 F (38.3 C)   Resp 20   Ht 5\' 7"  (1.702 m)   Wt 101.4 kg   SpO2 100%   BMI 35.01 kg/m  O2 sats: stable throughout Complications: No apparent complications Patient did tolerate procedure well Tracheostomy Brand:Shiley Tracheostomy Style:Cuffed Tracheostomy Size:  6.0 Tracheostomy Secured Tracheostomy Placement Confirmation:Trach cuff visualized and in place and Chest X ray ordered for placement    KLK:[id number] 11/22/2021, 2:43 PM

## 2021-11-23 DIAGNOSIS — I469 Cardiac arrest, cause unspecified: Secondary | ICD-10-CM | POA: Diagnosis not present

## 2021-11-23 LAB — COMPREHENSIVE METABOLIC PANEL
ALT: 30 U/L (ref 0–44)
AST: 47 U/L — ABNORMAL HIGH (ref 15–41)
Albumin: 2 g/dL — ABNORMAL LOW (ref 3.5–5.0)
Alkaline Phosphatase: 322 U/L — ABNORMAL HIGH (ref 38–126)
Anion gap: 6 (ref 5–15)
BUN: 36 mg/dL — ABNORMAL HIGH (ref 6–20)
CO2: 22 mmol/L (ref 22–32)
Calcium: 8.3 mg/dL — ABNORMAL LOW (ref 8.9–10.3)
Chloride: 117 mmol/L — ABNORMAL HIGH (ref 98–111)
Creatinine, Ser: 2.22 mg/dL — ABNORMAL HIGH (ref 0.44–1.00)
GFR, Estimated: 29 mL/min — ABNORMAL LOW (ref >60)
Glucose, Bld: 123 mg/dL — ABNORMAL HIGH (ref 70–99)
Potassium: 3.6 mmol/L (ref 3.5–5.1)
Sodium: 145 mmol/L (ref 135–145)
Total Bilirubin: 1.8 mg/dL — ABNORMAL HIGH (ref 0.3–1.2)
Total Protein: 5.7 g/dL — ABNORMAL LOW (ref 6.5–8.1)

## 2021-11-23 LAB — CBC
HCT: 28.1 % — ABNORMAL LOW (ref 36.0–46.0)
Hemoglobin: 9.1 g/dL — ABNORMAL LOW (ref 12.0–15.0)
MCH: 32.2 pg (ref 26.0–34.0)
MCHC: 32.4 g/dL (ref 30.0–36.0)
MCV: 99.3 fL (ref 80.0–100.0)
Platelets: 216 10*3/uL (ref 150–400)
RBC: 2.83 MIL/uL — ABNORMAL LOW (ref 3.87–5.11)
RDW: 15.3 % (ref 11.5–15.5)
WBC: 8.4 10*3/uL (ref 4.0–10.5)
nRBC: 0 % (ref 0.0–0.2)

## 2021-11-23 LAB — GLUCOSE, CAPILLARY
Glucose-Capillary: 119 mg/dL — ABNORMAL HIGH (ref 70–99)
Glucose-Capillary: 127 mg/dL — ABNORMAL HIGH (ref 70–99)
Glucose-Capillary: 115 mg/dL — ABNORMAL HIGH (ref 70–99)
Glucose-Capillary: 131 mg/dL — ABNORMAL HIGH (ref 70–99)
Glucose-Capillary: 132 mg/dL — ABNORMAL HIGH (ref 70–99)

## 2021-11-23 LAB — TRIGLYCERIDES: Triglycerides: 231 mg/dL — ABNORMAL HIGH (ref <150)

## 2021-11-23 LAB — PHOSPHORUS: Phosphorus: 2.7 mg/dL (ref 2.5–4.6)

## 2021-11-23 LAB — MAGNESIUM: Magnesium: 2.4 mg/dL (ref 1.7–2.4)

## 2021-11-23 MED ORDER — SERTRALINE HCL 25 MG PO TABS
50.0000 mg | ORAL_TABLET | Freq: Every day | ORAL | Status: DC
  Administered 2021-11-23 – 2021-11-26 (×4): 50 mg
  Filled 2021-11-23 (×4): qty 2

## 2021-11-23 MED ORDER — TRAVASOL 10 % IV SOLN
INTRAVENOUS | Status: AC
  Filled 2021-11-23: qty 508.8

## 2021-11-23 MED ORDER — GABAPENTIN 250 MG/5ML PO SOLN
300.0000 mg | Freq: Three times a day (TID) | ORAL | Status: DC
  Administered 2021-11-23 – 2021-11-26 (×9): 300 mg
  Filled 2021-11-23 (×12): qty 6

## 2021-11-23 MED ORDER — OXYCODONE HCL 5 MG PO TABS
10.0000 mg | ORAL_TABLET | Freq: Four times a day (QID) | ORAL | Status: DC
  Administered 2021-11-23 – 2021-11-26 (×12): 10 mg
  Filled 2021-11-23 (×12): qty 2

## 2021-11-23 MED ORDER — FUROSEMIDE 10 MG/ML IJ SOLN
40.0000 mg | Freq: Once | INTRAMUSCULAR | Status: AC
Start: 2021-11-23 — End: 2021-11-23
  Administered 2021-11-23: 40 mg via INTRAVENOUS
  Filled 2021-11-23: qty 4

## 2021-11-23 MED ORDER — POTASSIUM CHLORIDE 10 MEQ/50ML IV SOLN
10.0000 meq | INTRAVENOUS | Status: AC
  Administered 2021-11-23 (×2): 10 meq via INTRAVENOUS
  Filled 2021-11-23 (×2): qty 50

## 2021-11-23 MED ORDER — POTASSIUM CHLORIDE 10 MEQ/100ML IV SOLN: 10.0000 meq | INTRAVENOUS | Status: DC

## 2021-11-23 NOTE — Progress Notes (Addendum)
PHARMACY - TOTAL PARENTERAL NUTRITION CONSULT NOTE   Indication: Tube feed intolerance   Patient Measurements: Height: 5\' 7"  (170.2 cm) Weight: 94.1 kg (207 lb 7.3 oz) IBW/kg (Calculated) : 61.6   Body mass index is 32.49 kg/m. Usual Weight: 200 lbs per chart   Assessment:  36 yo W with with VT/Vfib cardiac arrest s/p about 20 minutes of CPR before ROSC with anoxic brain injury. Per family, patient had  chronic NVD and labile food tolerance prior to admission (she could tolerate some foods but others would send her to the bathroom for 1 hour or cause vomiting). Unclear if food intolerance is due to chronic pancreatitis or other cause. Since admission, tried vital high protein, vital AF 1.2 cal, and vital 1.5 cal at different rates but patient continued to have vomiting and diarrhea despite low rates. Now with rectal tube. Tube feeds discontinued 8/4. Pharmacy consulted for TPN.   Glucose / Insulin: BG < 180, A1C 4.5, received 5 units SSI/24 hrs Electrolytes: K 3.6, Mg 2.4, CO2 22 Renal: Scr 2.22 (BL 1)  Hepatic: T. Bili 1.8, other WNL, Albumin 2, triglycerides 231 Intake / Output; MIVF: UOP 0.8 ml/kg/hr. FMS 10/4, net + 13.3 L  GI Imaging: 7/28 KUB: Dilated common bile duct with possible choledocholithiasis 7/29 KUB: Relative paucity of bowel gas in the visualized abdomen. 7/31 KUB: Nonobstructive bowel gas pattern. 8/1 KUB: Increasing gaseous distention of small bowel and right colon, nonspecific GI Surgeries / Procedures: none  Central access: 8/6  TPN start date: 8/7   Nutritional Goals: Goal TPN rate is 80 mL/hr (provides 102 g (53 g/L) of protein, 298 g dextrose (15.5%), 63 g lipids (33 g/L) and 2053 kcals per day  RD Assessment: Estimated Needs Total Energy Estimated Needs: 2000-2200 Total Protein Estimated Needs: 100-115 grams Total Fluid Estimated Needs: >/= 2 L  Current Nutrition:  NPO  Plan:  Will start TPN on 8/7 at 40 ml/hr which will provide about 50% of  needs Electrolytes in TPN: Na 50 mEq/L, K 30 mEq/L, Ca 5 mEq/L, Mg 3 mEq/L, Phos 7 mmol/L, 10/7 ratio 1:2 Add standard MVI and trace elements to TPN  Continue sensitive SSI frequency q4hrs Standard TPN labs on Mon and Thurs and daily during initiation  Mon, PharmD, Jeanella Cara Clinical Pharmacist Please see AMION for all Pharmacists' Contact Phone Numbers 11/23/2021, 12:51 PM

## 2021-11-23 NOTE — Progress Notes (Addendum)
Speech Language Pathology Treatment: Dysphagia  Patient Details Name: Kara [name] MRN: [id number] DOB: May 14, 1985 Today's Date: 11/23/2021 Time: 5732-2025 SLP Time Calculation (min) (ACUTE ONLY): 17 min  Assessment / Plan / Recommendation Clinical Impression  Pt now has trach as of 8/7 and seen for swallow tx during PMV assessment. She was adequately awake for ice chips and tsp water however kept eyes closed, somewhat lethargic and easily distracted with confusion. Oral hygiene followed by ice chips accepted from spoon, masticated and transited for swallow. Anterior leakage with teaspoon sips thin water and immediate cough although coughing prior due to tracheal mucous- cough following water was not consistent. Pt will need instrumental assessment prior to initiation of food/liquids but is not quite ready. Her alertness, attention needs to improve and remain consistent. She can continue ice chips and sips water with staff. Would wait until she is more alert and less confused before letting family give ice/water.    HPI HPI: Patient is a 36 year old female who presented to [facility] ED on 7/28 s/p cardiac arrest. Fire dept/EMS arrived finding patient to be in VT.  Multiple shocks delivered, epi and amnio given.  About 15 minutes until ROSC was obtained by EMS arrival.  MRI 8/2 "showed restricted diffusion in bilat posterior frontal, parietal, and superior occipital cortex c/w cytotoxic edema from hypoxic ischemic injury. Subcortical nuclei are spared."  Since BSE on 8/4, pt required reintubation and received trach 8/6. 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      SLP Plan  Continue with current plan of care      Recommendations for follow up therapy are one component of a multi-disciplinary discharge planning process, led by the attending physician.  Recommendations may be updated based  on patient status, additional functional criteria and insurance authorization.    Recommendations  Diet recommendations: Other(comment) (ice chips, tsp sips water) Liquids provided via: Teaspoon Medication Administration: Via alternative means Compensations: Slow rate;Small sips/bites Postural Changes and/or Swallow Maneuvers: Seated upright 90 degrees                Oral Care Recommendations: Oral care QID Follow Up Recommendations: Skilled nursing-short term rehab (<3 hours/day) Assistance recommended at discharge: Frequent or constant Supervision/Assistance SLP Visit Diagnosis: Dysphagia, unspecified (R13.10) Plan: Continue with current plan of care           Royce Macadamia  11/23/2021, 11:48 AM

## 2021-11-23 NOTE — Progress Notes (Signed)
PCCM INTERVAL PROGRESS NOTE  Worsening agitation off Precedex. Trying to climb out of bed. RN had to restart dex. Long QT syndrome complicates adding additional agents specifically antipsychotics.   Will increase frequency of oxycodone to QID Increase frequency of gabapentin to TID  Restart home sertraline at reduced dose (50mg ). Minimal risk of raising QTC which we are monitoring continuously. Currently 504.    , AGACNP-BC [facility]  See Amion for personal pager PCCM on call pager [phone] until 7pm. Please call Elink 7p-7a. [phone]  11/23/2021 3:30 PM

## 2021-11-23 NOTE — Progress Notes (Addendum)
NAME:  Kara [name], MRN:  [id number], DOB:  10-Mar-1986, LOS: 10 ADMISSION DATE:  11/13/2021, CONSULTATION DATE:  7/28 REFERRING MD:  Dr. 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  DVT ultrasound negative.  Patient discharged with ABX on 11/07/2021.  During hospital course patient with low K and mag.  During the past few days per husband patient has been nauseous and not able to take her medications.  She has been taking medication for nausea.  On 7/28 in the afternoon, patient's husband states he went out and about 20 minutes later found patient down on floor with agonal respirations.  He called 911 and started CPR.  Fire dept/EMS arrived finding patient to be in VT.  Multiple shocks delivered, epi and amnio given.  About 15 minutes until ROSC was obtained by EMS arrival.  Patient intubated and was given sedation.  Patient transferred to [facility] ED.  Upon arrival to [facility] on 7/28, patient intubated and sedated.  Patient on epi drip.  EKG with no ST elevation but prolonged QTc.  Cardiology consulted.  CXR with no signs of pneumonia; ETT in good position.  Pertinent ED labs: UDS WNL, UA WNL, WBC 23.9, K2.7, glucose 290, AST 178, alk phos 321, troponin 111, ABG 7.24, 45, 156, 19.  CT head and PE chest pending.  Other labs and cultures pending.  Patient noted to be having some posturing and possible myoclonic activity.  Versed given.  EEG ordered.  PCCM consulted for ICU admission.   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 MRI 8/3 extubated 8/5 reintubated 8/6 trach 8/7 doing well with PMSV. Sips and chips started.   Interim History / Subjective:  On trach collar this morning and tolerating well. Some ongoing agitation and delirium.   Objective   Blood pressure 121/89, pulse 96, temperature 100 F (37.8 C), resp. rate (!) 26, height 5\' 7"  (1.702 m), weight 94.1 kg, SpO2 98 %.    Vent Mode: PRVC FiO2 (%):  [30 %-100 %] 50 % Set Rate:  [20 bmp] 20 bmp Vt Set:  [490 mL] 490 mL PEEP:  [5 cmH20] 5 cmH20 Pressure Support:  [8 cmH20] 8 cmH20 Plateau Pressure:  [12 cmH20-18 cmH20] 12 cmH20   Intake/Output Summary (Last 24 hours) at 11/23/2021 1016 Last data filed at 11/23/2021 1000 Gross per 24 hour  Intake 2097.13 ml  Output 2365 ml  Net -267.87 ml    Filed Weights   11/21/21 0500 11/22/21 0400 11/23/21 0437  Weight: 94.6 kg 101.4 kg 94.1 kg    Examination:  General: young adult female on trach collar.  HEENT: Trach in place. Dressing clean. Minimal secretions.  Neuro: Awake, alert. Ongoing agitation and delirium. Grabbing at things.  Chest: Scattered crackles.  Heart: RRR, no MRG. No sig edema.  Abdomen: Soft, NT, ND Skin: No rash  Labs K 3.6, Creat 2.2, Albumin 2, T bili 1.8,  triglyceride 231 WBC 8.4, Hgb 9.1   Resolved Hospital Problem list   Hx of splenic vein thrombosis: previously on warfarin complicated by abdominal hematoma requiring IR intervention, not presently on warfarin Hyponatremia Postextubation stridor Aspiration pneumonia Right lower leg cellulitis Thrombocytopenia due to acute illness  Assessment & Plan:  VT/Vfib Cardiac arrest: patient unseen for about 20 minutes; found down pulseless; found to be in VT shocked multiple times, given epi and amio; ROSC 15 after EMS arrival. Per  husband has been having N/V taking anti-nausea meds. Not taking her home meds that husband was aware of that past few days due to this.  UDS neg, alcohol neg. Hx of long qt syndrome Hypokalemia Continue supportive care Avoid QT prolonging meds Continue aggressive electrolyte supplement, this morning serum potassium was 3.6 Supplement and repeat in the morning  Postarrest encephalopathy: MRI with some evidence of anoxic injury. Patient is restless and and agitated - Precedex weaned off this morning - clonazepam, gabapentin - SSRI may cause QT prolongation, VPA can worsen pancreatitis, so will hold off for now.  - Looks like she is on 2mg  dilaudid q4 hours at home. Next step would be in to increase enteral opioids.  - Consider high dose thiamine, but has been on 100mg  daily since admission.   Seizure disorder Appreciate neurology work up Patient was loaded with Vimpat, continue Vimpat twice daily Continue gabapentin Continue to hold Topamax  Acute respiratory failure with hypoxia: trach placed 8/6 Pulmonary edema Hx of asthma: On Breo-Ellipta, abuterol, and singulair - wean FiO2 for sat goal > 90% - SLP for PMSV and swallow eval - Continue scheduled Pulmicort and Brovana, as needed albuterol - Continue diuresis IV lasix 40 mg  Right lower extremity cellulitis Aspiration pneumonia - Completed treatment with ceftriaxone  Chronic pain: Oxycodone at home - Oxycodone per tube  HTN/HLD - Continue holding home spironolactone/Crestor  AKI due to ischemic ATN - Monitor intake and output - Avoid nephrotoxic agents  Nutrition: enteral nutrition limited due to pancreatitis - starting TPN today  Shock liver - LFTs are stable  Best Practice (right click and "Reselect all SmartList Selections" daily)   Diet/type: Probably patient will be started on TPN today DVT prophylaxis: Heparin GI prophylaxis: PPI Lines: n/A Foley: yes Code Status:  full code Last date of  multidisciplinary goals of care discussion [8/5 husband and mother updated at bedside.]  Critical care time 40 minutes  Joneen Roach, AGACNP-BC [facility]  See Amion for personal pager PCCM on call pager [phone] until 7pm. Please call Elink 7p-7a. [phone]  11/23/2021 10:40 AM

## 2021-11-23 NOTE — Progress Notes (Signed)
OT Cancellation Note  Patient Details Name: Kara [name] MRN: [id number] DOB: 09-21-1985   Cancelled Treatment:    Reason Eval/Treat Not Completed: Other (comment) (Per RN, pt restless and just now sleeping and resting. Will hold and return as schedule allows.)  Early Steel M Jona Erkkila Jaritza Duignan MSOT, OTR/L Acute Rehab Office: [phone] 11/23/2021, 4:08 PM

## 2021-11-23 NOTE — TOC Progression Note (Signed)
Transition of Care [facility]) - Progression Note    Patient Details  Name: Taneil Lazarus MRN: [id number] Date of Birth: Jul 18, 1985  Transition of Care [facility]) CM/SW Contact  Delilah Shan, LCSWA Phone Number: 11/23/2021, 4:49 PM  Clinical Narrative:     Patient currently has cortrak, trached,and TPN. TOC will continue to follow and assist with patients dc planning needs.  Expected Discharge Plan: Skilled Nursing Facility Barriers to Discharge: Continued Medical Work up  Expected Discharge Plan and Services Expected Discharge Plan: Skilled Nursing Facility In-house Referral: Clinical Social Work Discharge Planning Services: CM Consult   Living arrangements for the past 2 months: Single Family Home                                       Social Determinants of Health (SDOH) Interventions    Readmission Risk Interventions     No data to display

## 2021-11-23 NOTE — Progress Notes (Signed)
Nutrition Follow-up  DOCUMENTATION CODES:   Not applicable  INTERVENTION:   TPN starting 8/7, advance to goal to meeting nutrition needs  Recommend close monitoring of potassium, magnesium, and phosphorus as pt is at high risk for refeeding syndrome given prolonged inadequate nutrition. (Pt is already has thiamine ordered via tube)   NUTRITION DIAGNOSIS:   Inadequate oral intake related to inability to eat as evidenced by NPO status.  Progressing with initiation of TPN   GOAL:   Patient will meet greater than or equal to 90% of their needs  Progressing  MONITOR:   Vent status, I & O's  REASON FOR ASSESSMENT:   Consult, Ventilator Assessment of nutrition requirement/status  ASSESSMENT:   36 y.o. female presented to the ED after cardiac arrest. Pt recently discharged on 7/[facility] from [facility] for RLE cellulitis. PMH includes HLD, GERD, CHF, EtOH abuse, HTN, and seizures. Pt admitted with V. Fib cardiac arrest, acute respiratory failure, and acute encephalopathy.  PTA noted pt taking anti-nausea medications for N/V. Per previous nutrition assessment pt only consumes small amt of food at a time and c/o early satiety and abd discomfort. Pt has hx of chronic pancreatitis and is on 72,000 units of creon with each meal.  Pt has been unable to tolerate TF even at low rates without vomiting as she is not able to receive her creon regimen via cortrak tube. TPN to start this PM via PICC.   8/03 Extubated 8/04 Cortrak placed; tip in the distal stomach; TF discontinued  8/05 failed bipap; intubated 8/06 PICC placed; s/p trach and diagnostic bronchoscopy    TPN goal rate is 80 ml/hr and will provide 2053 kcal and 102 grams of protein  Labs: Total Bilirubin: 1.8, TG: 231 CBG's: (NPO, pre-TPN) 119-152 (K 3.6, PO4 2.7 Mg 2.4)   Meds: colace (held this am), folic acid, SSI, MVI with minerals, protonix, miralax (held this am), Nabicarb TID, thiamine Precedex   UOP: 2285 ml   Diet Order:    Diet Order             Diet NPO time specified  Diet effective now                   EDUCATION NEEDS:   Not appropriate for education at this time  Skin:  Skin Assessment:  (non-pressure wound R knee; sternal laceration from AED)  Last BM:  900 ml --> 700 ml per 24 hours via rectal tube  Height:   Ht Readings from Last 1 Encounters:  11/13/21 5\' 7"  (1.702 m)     Weight:   Wt Readings from Last 1 Encounters:  11/23/21 94.1 kg    Ideal Body Weight:  61.4 kg  BMI:  Body mass index is 32.49 kg/m.  Estimated Nutritional Needs:   Kcal:  2000-2200  Protein:  100-115 grams  Fluid:  >/= 2 L   Eryanna Regal P., RD, LDN, CNSC See AMiON for contact information

## 2021-11-23 NOTE — Progress Notes (Signed)
Pt placed back on full ventilator support for night rest. 

## 2021-11-23 NOTE — Progress Notes (Signed)
Physical Therapy Treatment Patient Details Name: Kara [name] MRN: [id number] DOB: August 10, 1985 Today's Date: 11/23/2021   History of Present Illness Pt is a 36 y.o. female admitted 11/13/21 with v-fib/VT cardiac arrest, some posturing and possible myoclonic activity; UDS (-), EEG (-). VOZ:[id number] diffusion in bil posterior frontal, parietal, and superior occipital cortex, concerning for cytotoxic edema from hypoxic ischemic injury. ETT 7/28-8/3. Reintubated 8/5 and trach with bronch 8/6.PMHx:HF, HLD, seizure, ETOH use, asthma, anxiety, chronic pain, long QT syndrome.    PT Comments    Pt with improved cognition able to follow commands grossly 30% of the time with automatic statements and asking for mom throughout session. Pt with very limited eye opening and difficult to determine visual capacity at this time. Pt with assist with rolling, sitting and interaction with automatic movement of swinging and kicking legs EOB. Mom present throughout session and educated for continued progression to commands following and function.   Pt on trach collar throughout session initially 40% with desaturation to 76% with rolling and coughing, increased to 60% FiO2 during mobility with HOB elevated to maintain 94% and returned to 40% end of session at 95% SPO2 HR 92 BP 101/70    Recommendations for follow up therapy are one component of a multi-disciplinary discharge planning process, led by the attending physician.  Recommendations may be updated based on patient status, additional functional criteria and insurance authorization.  Follow Up Recommendations  Skilled nursing-short term rehab (<3 hours/day) Can patient physically be transported by private vehicle: No   Assistance Recommended at Discharge Frequent or constant Supervision/Assistance  Patient can return home with the following Two people to help with walking and/or transfers;Two people to help with bathing/dressing/bathroom;Assistance with  cooking/housework;Assist for transportation;Direct supervision/assist for medications management   Equipment Recommendations  Wheelchair (measurements PT);Hospital bed    Recommendations for Other Services       Precautions / Restrictions Precautions Precautions: Fall Precaution Comments: Watch HR and O2 sats, Cortrak, trach, flexiseal Restrictions Weight Bearing Restrictions: No     Mobility  Bed Mobility Overal bed mobility: Needs Assistance Bed Mobility: Rolling, Sidelying to Sit Rolling: Mod assist, +2 for safety/equipment   Supine to sit: Max assist, +2 for physical assistance, HOB elevated Sit to supine: Max assist, +2 for safety/equipment, +2 for physical assistance   General bed mobility comments: pt mod assist with hand over hand assist and cues to reach for rail and bend knee to roll with physical assist of pad to complete bil rolling and for pericare. pivot supine to sit with HOB 40 degrees with max +2 assist. Return to supine with max +2 assist and total +2 for slide to [facility]. Initial max assist for sitting balance with progression to min assist tendency for posterior left lean    Transfers                   General transfer comment: not yet ready to attempt    Ambulation/Gait                   Stairs             Wheelchair Mobility    Modified Rankin (Stroke Patients Only)       Balance Overall balance assessment: Needs assistance   Sitting balance-Leahy Scale: Poor Sitting balance - Comments: EOB grossly 6 min with max -min assist progression Postural control: Posterior lean, Left lateral lean  Cognition Arousal/Alertness: Awake/alert Behavior During Therapy: Flat affect, Restless Overall Cognitive Status: Impaired/Different from baseline Area of Impairment: Attention               Rancho Levels of Cognitive Functioning [place]igos Scales of Cognitive Functioning:  Confused, Appropriate   Current Attention Level: Sustained           General Comments: pt able to follow command to squeeze hand and reach for rail on commands, attempting to mouth words asking for mom and stating "yes ma'am"   Rancho Mirant Scales of Cognitive Functioning: Confused, Appropriate    Exercises      General Comments        Pertinent Vitals/Pain Pain Assessment Pain Assessment: Faces Pain Score: 4  Pain Location: sacrum with pericare Pain Descriptors / Indicators: Aching, Guarding Pain Intervention(s): Limited activity within patient's tolerance, Repositioned    Home Living                          Prior Function            PT Goals (current goals can now be found in the care plan section) Progress towards PT goals: Progressing toward goals    Frequency    Min 3X/week      PT Plan Current plan remains appropriate    Co-evaluation              AM-PAC PT "6 Clicks" Mobility   Outcome Measure  Help needed turning from your back to your side while in a flat bed without using bedrails?: A Lot Help needed moving from lying on your back to sitting on the side of a flat bed without using bedrails?: Total Help needed moving to and from a bed to a chair (including a wheelchair)?: Total Help needed standing up from a chair using your arms (e.g., wheelchair or bedside chair)?: Total Help needed to walk in hospital room?: Total Help needed climbing 3-5 steps with a railing? : Total 6 Click Score: 7    End of Session Equipment Utilized During Treatment: Oxygen Activity Tolerance: Patient tolerated treatment well Patient left: in bed;with call bell/phone within reach;with family/visitor present;with nursing/sitter in room Nurse Communication: Mobility status PT Visit Diagnosis: Other abnormalities of gait and mobility (R26.89);Muscle weakness (generalized) (M62.81)     Time: 8295-6213 PT Time Calculation (min) (ACUTE ONLY): 27  min  Charges:  $Therapeutic Exercise: 8-22 mins $Therapeutic Activity: 8-22 mins                     Merryl Hacker, PT Acute Rehabilitation Services Office: [phone]    Perkins Molina B Daimon Kean 11/23/2021, 10:00 AM

## 2021-11-24 ENCOUNTER — Telehealth (HOSPITAL_COMMUNITY): Payer: Self-pay | Admitting: Pharmacy Technician

## 2021-11-24 ENCOUNTER — Other Ambulatory Visit (HOSPITAL_COMMUNITY): Payer: Self-pay

## 2021-11-24 DIAGNOSIS — I469 Cardiac arrest, cause unspecified: Secondary | ICD-10-CM | POA: Diagnosis not present

## 2021-11-24 LAB — BASIC METABOLIC PANEL
Anion gap: 9 (ref 5–15)
Anion gap: 7 (ref 5–15)
BUN: 31 mg/dL — ABNORMAL HIGH (ref 6–20)
BUN: 25 mg/dL — ABNORMAL HIGH (ref 6–20)
CO2: 22 mmol/L (ref 22–32)
CO2: 24 mmol/L (ref 22–32)
Calcium: 8.2 mg/dL — ABNORMAL LOW (ref 8.9–10.3)
Calcium: 8.5 mg/dL — ABNORMAL LOW (ref 8.9–10.3)
Chloride: 119 mmol/L — ABNORMAL HIGH (ref 98–111)
Chloride: 118 mmol/L — ABNORMAL HIGH (ref 98–111)
Creatinine, Ser: 1.66 mg/dL — ABNORMAL HIGH (ref 0.44–1.00)
Creatinine, Ser: 1.48 mg/dL — ABNORMAL HIGH (ref 0.44–1.00)
GFR, Estimated: 41 mL/min — ABNORMAL LOW (ref >60)
GFR, Estimated: 46 mL/min — ABNORMAL LOW (ref >60)
Glucose, Bld: 150 mg/dL — ABNORMAL HIGH (ref 70–99)
Glucose, Bld: 170 mg/dL — ABNORMAL HIGH (ref 70–99)
Potassium: 3.1 mmol/L — ABNORMAL LOW (ref 3.5–5.1)
Potassium: 3.6 mmol/L (ref 3.5–5.1)
Sodium: 150 mmol/L — ABNORMAL HIGH (ref 135–145)
Sodium: 149 mmol/L — ABNORMAL HIGH (ref 135–145)

## 2021-11-24 LAB — CBC
HCT: 28.6 % — ABNORMAL LOW (ref 36.0–46.0)
Hemoglobin: 9.1 g/dL — ABNORMAL LOW (ref 12.0–15.0)
MCH: 32.2 pg (ref 26.0–34.0)
MCHC: 31.8 g/dL (ref 30.0–36.0)
MCV: 101.1 fL — ABNORMAL HIGH (ref 80.0–100.0)
Platelets: 221 10*3/uL (ref 150–400)
RBC: 2.83 MIL/uL — ABNORMAL LOW (ref 3.87–5.11)
RDW: 15.7 % — ABNORMAL HIGH (ref 11.5–15.5)
WBC: 8.7 10*3/uL (ref 4.0–10.5)
nRBC: 0 % (ref 0.0–0.2)

## 2021-11-24 LAB — GLUCOSE, CAPILLARY
Glucose-Capillary: 125 mg/dL — ABNORMAL HIGH (ref 70–99)
Glucose-Capillary: 136 mg/dL — ABNORMAL HIGH (ref 70–99)
Glucose-Capillary: 145 mg/dL — ABNORMAL HIGH (ref 70–99)
Glucose-Capillary: 146 mg/dL — ABNORMAL HIGH (ref 70–99)
Glucose-Capillary: 148 mg/dL — ABNORMAL HIGH (ref 70–99)
Glucose-Capillary: 149 mg/dL — ABNORMAL HIGH (ref 70–99)
Glucose-Capillary: 153 mg/dL — ABNORMAL HIGH (ref 70–99)

## 2021-11-24 LAB — MAGNESIUM: Magnesium: 1.6 mg/dL — ABNORMAL LOW (ref 1.7–2.4)

## 2021-11-24 LAB — PHOSPHORUS: Phosphorus: 2.8 mg/dL (ref 2.5–4.6)

## 2021-11-24 MED ORDER — MELATONIN 3 MG PO TABS: 3.0000 mg | ORAL_TABLET | Freq: Every day | ORAL | Status: DC

## 2021-11-24 MED ORDER — CLONAZEPAM 0.5 MG PO TABS
2.0000 mg | ORAL_TABLET | Freq: Two times a day (BID) | ORAL | Status: DC
  Administered 2021-11-24 – 2021-11-26 (×5): 2 mg
  Filled 2021-11-24 (×2): qty 2
  Filled 2021-11-24: qty 4
  Filled 2021-11-24: qty 2
  Filled 2021-11-24: qty 4

## 2021-11-24 MED ORDER — MAGNESIUM SULFATE 2 GM/50ML IV SOLN
2.0000 g | Freq: Once | INTRAVENOUS | Status: AC
  Administered 2021-11-24: 2 g via INTRAVENOUS
  Filled 2021-11-24: qty 50

## 2021-11-24 MED ORDER — POTASSIUM CHLORIDE 10 MEQ/50ML IV SOLN
10.0000 meq | INTRAVENOUS | Status: AC
  Administered 2021-11-24 (×2): 10 meq via INTRAVENOUS
  Filled 2021-11-24 (×2): qty 50

## 2021-11-24 MED ORDER — MELATONIN 3 MG PO TABS
3.0000 mg | ORAL_TABLET | Freq: Every day | ORAL | Status: DC
  Administered 2021-11-24 – 2021-11-25 (×2): 3 mg
  Filled 2021-11-24 (×2): qty 1

## 2021-11-24 MED ORDER — LORAZEPAM 2 MG/ML IJ SOLN
2.0000 mg | Freq: Four times a day (QID) | INTRAMUSCULAR | Status: DC | PRN
  Administered 2021-11-24 – 2021-11-25 (×2): 2 mg via INTRAVENOUS
  Filled 2021-11-24 (×3): qty 1

## 2021-11-24 MED ORDER — TRAVASOL 10 % IV SOLN
INTRAVENOUS | Status: AC
  Filled 2021-11-24: qty 763.2

## 2021-11-24 NOTE — Progress Notes (Signed)
NAME:  Kara [name], MRN:  [id number], DOB:  1985/06/28, LOS: 11 ADMISSION DATE:  11/13/2021, CONSULTATION DATE:  7/28 REFERRING MD:  Dr. 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  DVT ultrasound negative.  Patient discharged with ABX on 11/07/2021.  During hospital course patient with low K and mag.  During the past few days per husband patient has been nauseous and not able to take her medications.  She has been taking medication for nausea.  On 7/28 in the afternoon, patient's husband states he went out and about 20 minutes later found patient down on floor with agonal respirations.  He called 911 and started CPR.  Fire dept/EMS arrived finding patient to be in VT.  Multiple shocks delivered, epi and amnio given.  About 15 minutes until ROSC was obtained by EMS arrival.  Patient intubated and was given sedation.  Patient transferred to [facility] ED.  Upon arrival to [facility] on 7/28, patient intubated and sedated.  Patient on epi drip.  EKG with no ST elevation but prolonged QTc.  Cardiology consulted.  CXR with no signs of pneumonia; ETT in good position.  Pertinent ED labs: UDS WNL, UA WNL, WBC 23.9, K2.7, glucose 290, AST 178, alk phos 321, troponin 111, ABG 7.24, 45, 156, 19.  CT head and PE chest pending.  Other labs and cultures pending.  Patient noted to be having some posturing and possible myoclonic activity.  Versed given.  EEG ordered.  PCCM consulted for ICU admission.   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 MRI 8/3 extubated 8/5 reintubated 8/6 trach 8/7 doing well with PMSV. Sips and chips started.   Interim History / Subjective:  Was weaned off precedex yesterday but needed to go back on. On vent overnight for rest. Back on trach collar this morning and doing well.    Objective   Blood pressure 103/76, pulse 68, temperature 99.9 F (37.7 C), resp. rate 19, height 5\' 7"  (1.702 m), weight 94.1 kg, SpO2 100 %.    Vent Mode: PRVC FiO2 (%):  [35 %] 35 % Set Rate:  [20 bmp] 20 bmp Vt Set:  [490 mL] 490 mL PEEP:  [5 cmH20] 5 cmH20 Plateau Pressure:  [16 cmH20-18 cmH20] 18 cmH20   Intake/Output Summary (Last 24 hours) at 11/24/2021 0800 Last data filed at 11/24/2021 0700 Gross per 24 hour  Intake 1032.16 ml  Output 2515 ml  Net -1482.84 ml    Filed Weights   11/21/21 0500 11/22/21 0400 11/23/21 0437  Weight: 94.6 kg 101.4 kg 94.1 kg    Examination:  General: Young adult female on trach collar. Mildly restless HEENT: Trach in place. PMSV.  Neuro: Alert, oriented. Restless.  Chest: Scattered crackles.   Heart: RRR, no MRG Abdomen: Soft, NT, ND Skin: Grossly intact  Labs K 3.1, Creat 1.6, mag 1.6 WBC 8.7, Hgb 9.1   Resolved Hospital Problem list  Hx of splenic vein thrombosis: previously on warfarin complicated by abdominal hematoma requiring IR intervention, not presently on warfarin. Hyponatremia Postextubation stridor Aspiration pneumonia Right lower leg cellulitis Thrombocytopenia due to acute illness  Assessment & Plan:  VT/Vfib Cardiac arrest: patient unseen for about 20 minutes; found down pulseless; found to be in VT shocked multiple times, given epi and amio; ROSC 15 after EMS arrival. Per husband has been having N/V taking anti-nausea meds. Not taking her home meds that husband  was aware of that past few days due to this.  UDS neg, alcohol neg. Hx of long qt syndrome Hypokalemia - Continue supportive care - Avoid QT prolonging meds - Continue aggressive electrolyte supplement, this morning serum potassium was 3.1 - Supplement and repeat in the morning  Postarrest encephalopathy: MRI with some evidence of anoxic injury. Patient is restless and and agitated - Back on precedex, goal is to get this off in the next day or two - clonazepam, gabapentin, oxycodone increased 8/7 - Sertraline with close watch of QT (448 today) - Current dose of oxycodone is higher than her home dilaudid dosing.  - Consider high dose thiamine, but has been on 100mg  daily since admission.   Seizure disorder - Patient was loaded with Vimpat, continue Vimpat twice daily - Continue to hold Topamax  Acute respiratory failure with hypoxia: trach placed 8/6 Pulmonary edema Hx of asthma: On Breo-Ellipta, abuterol, and singulair - wean FiO2 for sat goal > 90% - SLP for PMSV and swallow eval - Continue scheduled Pulmicort and Brovana, as needed albuterol - Hold lasix today  Right lower extremity cellulitis Aspiration pneumonia - Completed treatment with ceftriaxone  Chronic pain: Oxycodone at home - Oxycodone per tube as above  HTN/HLD - Continue holding home spironolactone/Crestor  AKI due to ischemic ATN - Monitor intake and output - Avoid nephrotoxic agents - DC foley  Nutrition: enteral nutrition limited due to pancreatitis - starting TPN today - check lipase - ? Timing of EN trial   Best Practice (right click and "Reselect all SmartList Selections" daily)   Diet/type: TPN DVT prophylaxis: Heparin GI prophylaxis: PPI Lines: PICC Foley: yes - out today Code Status:  full code Last date of multidisciplinary goals of care discussion [8/5 husband and mother updated at bedside.]  Critical care time 36 minutes  Joneen Roach, AGACNP-BC [facility]  See Amion for personal pager PCCM on call pager [phone] until 7pm. Please call Elink 7p-7a. [phone]  11/24/2021 8:00 AM

## 2021-11-24 NOTE — Progress Notes (Signed)
Unable to replace potassium and magnesium per protocol d/t TPN containing electrolytes

## 2021-11-24 NOTE — Progress Notes (Signed)
Brief Nutrition Follow-up:  TPN initiated yesterday, increasing to 75% goal rate today  Noted some electrolyte abnormalities including hypernatremia, hypokalemia. CBGs acceptable  Discussed nutrition poc with Dr. Katrinka Blazing and plan to attempt to advance Cortrak to post-pyloric region. Given chronic pancreatitis and inability to administer PERT per tube, recommend attempting placement beyond the LOT into Jejunum. Once tube advanced, plan to start TF at trickle rate and monitor.   Continue TPN   Romelle Starcher MS, RDN, LDN, CNSC Registered Dietitian 3 Clinical Nutrition RD Pager and On-Call Pager Number Located in [place]

## 2021-11-24 NOTE — TOC Progression Note (Signed)
Transition of Care [facility]) - Progression Note    Patient Details  Name: Derionna Salvador MRN: [id number] Date of Birth: 06-16-1985  Transition of Care [facility]) CM/SW Contact  Graves-Bigelow, Lamar Laundry, RN Phone Number: 11/24/2021, 3:35 PM  Clinical Narrative: Case Manager discussed the patient in progression rounds today. Case Manager spoke with provider this am and they are agreeable to have Case Manager speak with family regarding LTAC. Case Manager was able to speak with the patients mother and she and the spouse are agreeable to LTAC at [facility]. Case Manager did make a referral with [facility] and Liaison Noreene Larsson will submit clinicals to insurance in the morning. Case Manager will continue to follow for additional transition of care needs as the patient progresses.    Expected Discharge Plan: Long Term Acute Care (LTAC) Barriers to Discharge: Continued Medical Work up  Expected Discharge Plan and Services Expected Discharge Plan: Long Term Acute Care (LTAC) In-house Referral: Clinical Social Work Discharge Planning Services: CM Consult Post Acute Care Choice: Long Term Acute Care (LTAC) Living arrangements for the past 2 months: Single Family Home                   Readmission Risk Interventions     No data to display

## 2021-11-24 NOTE — Progress Notes (Signed)
PHARMACY - TOTAL PARENTERAL NUTRITION CONSULT NOTE   Indication: Tube feed intolerance   Patient Measurements: Height: 5\' 7"  (170.2 cm) Weight: 94.1 kg (207 lb 7.3 oz) IBW/kg (Calculated) : 61.6   Body mass index is 32.49 kg/m. Usual Weight: 200 lbs per chart   Assessment:  36 yo W with with VT/Vfib cardiac arrest s/p about 20 minutes of CPR before ROSC with anoxic brain injury. Per family, patient had  chronic NVD and labile food tolerance prior to admission (she could tolerate some foods but others would send her to the bathroom for 1 hour or cause vomiting). Unclear if food intolerance is due to chronic pancreatitis or other cause. Since admission, tried vital high protein, vital AF 1.2 cal, and vital 1.5 cal at different rates but patient continued to have vomiting and diarrhea despite low rates. Now with rectal tube. Tube feeds discontinued 8/4. Pharmacy consulted for TPN.   Glucose / Insulin: BG < 180, A1C 4.5, received 5 units SSI/24 hrs Electrolytes: Na 150, K 3.1, Phos 2.8, Mg 1.6 CO2 22 Renal: Scr 2.22>1.66 (BL 1)  Hepatic: T. Bili 1.8, other WNL, Albumin 2, triglycerides 231 Intake / Output; MIVF: UOP 0.9 ml/kg/hr. FMS 10/4, net + 13.3 L  GI Imaging: 7/28 KUB: Dilated common bile duct with possible choledocholithiasis 7/29 KUB: Relative paucity of bowel gas in the visualized abdomen. 7/31 KUB: Nonobstructive bowel gas pattern. 8/1 KUB: Increasing gaseous distention of small bowel and right colon, nonspecific GI Surgeries / Procedures: none  Central access: 8/6  TPN start date: 8/7   Nutritional Goals: Goal TPN rate is 80 mL/hr (provides 102 g (53 g/L) of protein, 298 g dextrose (15.5%), 63 g lipids (33 g/L) and 2053 kcals per day  RD Assessment: Estimated Needs Total Energy Estimated Needs: 2000-2200 Total Protein Estimated Needs: 100-115 grams Total Fluid Estimated Needs: >/= 2 L  Current Nutrition:  NPO  Plan:  Increase TPN on 8/7 at 60 ml/hr which will  provide about 75% of needs Electrolytes in TPN: Na 0 mEq/L, K 35 mEq/L, Ca 5 mEq/L, Mg 5 mEq/L, Phos 5 mmol/L, Max acetate  Add standard MVI and trace elements to TPN  Continue sensitive SSI frequency q4hrs Kcl 10 meq x 4 Mag 2 gm x 1 Standard TPN labs on Mon and Thurs and daily during initiation, recheck triglycerides  Tue, PharmD, [facility] Clinical Pharmacist Please see AMION for all Pharmacists' Contact Phone Numbers 11/24/2021, 10:29 AM

## 2021-11-24 NOTE — TOC Benefit Eligibility Note (Signed)
Patient Product/process development scientist completed.    The patient is currently admitted and upon discharge could be taking lacosamide (Vimpat) 100 mg tablets.  The current 30 day co-pay is $8.21.   The patient is insured through [employer]    Roland Earl, CPhT Pharmacy Patient Advocate Specialist [facility] Patient Advocate Team Direct Number: [phone]  Fax: [phone]

## 2021-11-24 NOTE — Progress Notes (Signed)
   11/24/21 1530  Clinical Encounter Type  Visited With Patient and family together  Visit Type Follow-up;Critical Care  Referral From Nurse  Consult/Referral To Chaplain Benetta Spar)  Spiritual Encounters  Spiritual Needs Emotional  Stress Factors  Family Stress Factors Exhausted;Loss   Had pleasure of meeting with Ms. Kara [name] and Kara [name] as patient was sitting up in chair. Chaplain provided reflective listening as patient shared emotion regarding the recent loss of Kara nephew. Kara [name] feels that she is making significant progress and is grateful to be off ventilator and to have been extubated. Chaplain will continue follow patient and family - providing spiritual support as requested. [address] [place], Aletha Halim., [phone]

## 2021-11-24 NOTE — Progress Notes (Signed)
Physical Therapy Treatment Patient Details Name: Kara [name] MRN: [id number] DOB: April 12, 1986 Today's Date: 11/24/2021   History of Present Illness Pt is a 36 y.o. female admitted 11/13/21 with v-fib/VT cardiac arrest, some posturing and possible myoclonic activity; UDS (-), EEG (-). QIO:[id number] diffusion in bil posterior frontal, parietal, and superior occipital cortex, concerning for cytotoxic edema from hypoxic ischemic injury. ETT 7/28-8/3. Reintubated 8/5 and trach with bronch 8/6.PMHx:HF, HLD, seizure, ETOH use, asthma, anxiety, chronic pain, long QT syndrome.    PT Comments    Pt with improved verbalization with use of PMSV this session but maintains eyes closed. With eye lids held open pt with partial appearance of gaze fixation but unable to verbalize anything sight related. Pt restless with intermittent command following and able to rise from supine at 90 degree angle on bed repeatedly but unable to consistently maintain engagement for mobility or balance. Mom present throughout session. Pt fatigued end of session and no longer verbalizing. Pt on 35% fIO2 trach collar and RA during session at 95% SPO2 maintained.    Recommendations for follow up therapy are one component of a multi-disciplinary discharge planning process, led by the attending physician.  Recommendations may be updated based on patient status, additional functional criteria and insurance authorization.  Follow Up Recommendations  Skilled nursing-short term rehab (<3 hours/day) Can patient physically be transported by private vehicle: No   Assistance Recommended at Discharge Frequent or constant Supervision/Assistance  Patient can return home with the following Two people to help with walking and/or transfers;Two people to help with bathing/dressing/bathroom;Assistance with cooking/housework;Assist for transportation;Direct supervision/assist for medications management   Equipment Recommendations  Wheelchair  (measurements PT);Hospital bed    Recommendations for Other Services       Precautions / Restrictions Precautions Precautions: Fall Precaution Comments: Watch HR and O2 sats, Cortrak, trach, flexiseal     Mobility  Bed Mobility Overal bed mobility: Needs Assistance Bed Mobility: Supine to Sit, Sit to Supine     Supine to sit: Max assist, +2 for safety/equipment, HOB elevated     General bed mobility comments: initial transition with spinning and rise from supine to sitting with pad and HOB 30 degrees with max +2 assist. Once EOB pt with strong posterior lean and laid herself down into supine with feet over bed x 4 with min assist to rise from supine when pt initiating. Return to supine with total +2 as pt fatigued and not following commands. Total +2 to slide toward [facility]    Transfers Overall transfer level: Needs assistance   Transfers: Sit to/from Stand Sit to Stand: Max assist, +2 physical assistance           General transfer comment: pt initiated sit to stand x 1 from EOB with bil knees blocked and pt pulling on therapist with pt able to get weight in bil feet and initiate rise with partial sacral clearance but couldn't achieve full standing, unable to replicate with cues and assist    Ambulation/Gait               General Gait Details: not yet able   Stairs             Wheelchair Mobility    Modified Rankin (Stroke Patients Only)       Balance Overall balance assessment: Needs assistance Sitting-balance support: Feet unsupported, Single extremity supported, Bilateral upper extremity supported, No upper extremity supported Sitting balance-Leahy Scale: Zero Sitting balance - Comments: EOB grossly 15 min with variation from max to  minguard assist for sitting. Pt impulsive and restless Postural control: Posterior lean                                  Cognition Arousal/Alertness: Awake/alert Behavior During Therapy: Flat affect,  Restless Overall Cognitive Status: Impaired/Different from baseline Area of Impairment: Attention, Orientation, Memory, Following commands, Safety/judgement               Rancho Levels of Cognitive Functioning [place]ant Scales of Cognitive Functioning: Confused, Inappropriate Non-Agitated Orientation Level: Disoriented to, Situation, Place Current Attention Level: Focused Memory: Decreased short-term memory Following Commands: Follows one step commands inconsistently Safety/Judgement: Decreased awareness of deficits, Decreased awareness of safety     General Comments: pt talking with PMSV throughout session and oriented to day and mom in room. Pt maintaining eyes closed throughout session with spontaneous movement and assist with limited command following grossly 10 % of session   [email or web address] Scales of Cognitive Functioning: Confused, Inappropriate Non-Agitated    Exercises      General Comments        Pertinent Vitals/Pain Pain Assessment Pain Assessment: No/denies pain    Home Living                          Prior Function            PT Goals (current goals can now be found in the care plan section) Progress towards PT goals: Progressing toward goals    Frequency    Min 3X/week      PT Plan Current plan remains appropriate    Co-evaluation              AM-PAC PT "6 Clicks" Mobility   Outcome Measure  Help needed turning from your back to your side while in a flat bed without using bedrails?: A Lot Help needed moving from lying on your back to sitting on the side of a flat bed without using bedrails?: Total Help needed moving to and from a bed to a chair (including a wheelchair)?: Total Help needed standing up from a chair using your arms (e.g., wheelchair or bedside chair)?: Total Help needed to walk in hospital room?: Total Help needed climbing 3-5 steps with a railing? : Total 6 Click Score: 7    End of Session  Equipment Utilized During Treatment: Oxygen Activity Tolerance: Patient tolerated treatment well Patient left: in bed;with call bell/phone within reach;with family/visitor present;with nursing/sitter in room;with bed alarm set Nurse Communication: Mobility status;Need for lift equipment PT Visit Diagnosis: Other abnormalities of gait and mobility (R26.89);Muscle weakness (generalized) (M62.81)     Time: 2130-8657 PT Time Calculation (min) (ACUTE ONLY): 27 min  Charges:  $Therapeutic Activity: 23-37 mins                     Merryl Hacker, PT Acute Rehabilitation Services Office: [phone]    Jeremaih Klima B Donnald Tabar 11/24/2021, 12:12 PM

## 2021-11-24 NOTE — Progress Notes (Signed)
SLP Cancellation Note  Patient Details Name: Kara [name] MRN: [id number] DOB: Aug 28, 1985   Cancelled treatment:       Reason Eval/Treat Not Completed: Patient at procedure or test/unavailable   Estuardo Frisbee, Riley Nearing 11/24/2021, 12:30 PM

## 2021-11-24 NOTE — Progress Notes (Signed)
eLink Physician-Brief Progress Note Patient Name: Kara [name] DOB: 1985-05-10 MRN: [id number]   Date of Service  11/24/2021  HPI/Events of Note  K, mag are low. Noted creatinine. Gets electrolytes in TPN as well   eICU Interventions  20 meq kcl x 1 2 gram mag x 1 Would recheck levels once this is done to find if more needed      Intervention Category Major Interventions: Acute renal failure - evaluation and management  Oretha Milch 11/24/2021, 6:13 AM

## 2021-11-24 NOTE — Progress Notes (Signed)
PHARMACY - TOTAL PARENTERAL NUTRITION CONSULT NOTE   Indication: Tube feed intolerance   Patient Measurements: Height: 5\' 7"  (170.2 cm) Weight: 94.1 kg (207 lb 7.3 oz) IBW/kg (Calculated) : 61.6   Body mass index is 32.49 kg/m. Usual Weight: 200 lbs per chart   Assessment:  36 yo W with with VT/Vfib cardiac arrest s/p about 20 minutes of CPR before ROSC with anoxic brain injury. Per family, patient had  chronic NVD and labile food tolerance prior to admission (she could tolerate some foods but others would send her to the bathroom for 1 hour or cause vomiting). Unclear if food intolerance is due to chronic pancreatitis or other cause. Since admission, tried vital high protein, vital AF 1.2 cal, and vital 1.5 cal at different rates but patient continued to have vomiting and diarrhea despite low rates. Now with rectal tube. Tube feeds discontinued 8/4. Pharmacy consulted for TPN.   Glucose / Insulin: BG < 180, A1C 4.5, received 5 units SSI/24 hrs Electrolytes: Na 150, K 3.1, Phos 2.8, Mg 1.6 CO2 22 Renal: Scr 2.22>1.66 (BL 1)  Hepatic: T. Bili 1.8, other WNL, Albumin 2, triglycerides 231 Intake / Output; MIVF: UOP 0.9 ml/kg/hr. FMS 10/4, net + 13.3 L  GI Imaging: 7/28 KUB: Dilated common bile duct with possible choledocholithiasis 7/29 KUB: Relative paucity of bowel gas in the visualized abdomen. 7/31 KUB: Nonobstructive bowel gas pattern. 8/1 KUB: Increasing gaseous distention of small bowel and right colon, nonspecific GI Surgeries / Procedures: none  Central access: 8/6  TPN start date: 8/7   Nutritional Goals: Goal TPN rate is 80 mL/hr (provides 102 g (53 g/L) of protein, 298 g dextrose (15.5%), 63 g lipids (33 g/L) and 2053 kcals per day  RD Assessment: Estimated Needs Total Energy Estimated Needs: 2000-2200 Total Protein Estimated Needs: 100-115 grams Total Fluid Estimated Needs: >/= 2 L  Current Nutrition:  NPO  Plan:  Increase TPN on 8/7 at 60 ml/hr which will  provide about 75% of needs Electrolytes in TPN: Na 0 mEq/L, K 35 mEq/L, Ca 5 mEq/L, Mg 5 mEq/L, Phos 5 mmol/L, Max acetate  Add standard MVI and trace elements to TPN  Continue sensitive SSI frequency q4hrs Kcl 10 meq x 4 Mag 2 gm x 1 Standard TPN labs on Mon and Thurs and daily during initiation, recheck triglycerides  Sat, PharmD, [facility] Clinical Pharmacist Please see AMION for all Pharmacists' Contact Phone Numbers 11/24/2021, 10:14 AM

## 2021-11-24 NOTE — Telephone Encounter (Signed)
Pharmacy Patient Advocate Encounter  Insurance verification completed.    The patient is insured through [employer]   The patient is currently admitted and ran test claims for the following: lacosamide (Vimpat) 100 mg tablets.  Copays and coinsurance results were relayed to Inpatient clinical team.

## 2021-11-25 ENCOUNTER — Inpatient Hospital Stay (HOSPITAL_COMMUNITY): Payer: BC Managed Care – PPO

## 2021-11-25 DIAGNOSIS — I469 Cardiac arrest, cause unspecified: Secondary | ICD-10-CM | POA: Diagnosis not present

## 2021-11-25 LAB — BASIC METABOLIC PANEL
Anion gap: 5 (ref 5–15)
BUN: 22 mg/dL — ABNORMAL HIGH (ref 6–20)
CO2: 24 mmol/L (ref 22–32)
Calcium: 8.3 mg/dL — ABNORMAL LOW (ref 8.9–10.3)
Chloride: 120 mmol/L — ABNORMAL HIGH (ref 98–111)
Creatinine, Ser: 1.31 mg/dL — ABNORMAL HIGH (ref 0.44–1.00)
GFR, Estimated: 54 mL/min — ABNORMAL LOW (ref >60)
Glucose, Bld: 183 mg/dL — ABNORMAL HIGH (ref 70–99)
Potassium: 3.6 mmol/L (ref 3.5–5.1)
Sodium: 149 mmol/L — ABNORMAL HIGH (ref 135–145)

## 2021-11-25 LAB — GLUCOSE, CAPILLARY
Glucose-Capillary: 159 mg/dL — ABNORMAL HIGH (ref 70–99)
Glucose-Capillary: 143 mg/dL — ABNORMAL HIGH (ref 70–99)
Glucose-Capillary: 124 mg/dL — ABNORMAL HIGH (ref 70–99)
Glucose-Capillary: 113 mg/dL — ABNORMAL HIGH (ref 70–99)
Glucose-Capillary: 161 mg/dL — ABNORMAL HIGH (ref 70–99)

## 2021-11-25 LAB — CBC
HCT: 29 % — ABNORMAL LOW (ref 36.0–46.0)
Hemoglobin: 8.9 g/dL — ABNORMAL LOW (ref 12.0–15.0)
MCH: 31.9 pg (ref 26.0–34.0)
MCHC: 30.7 g/dL (ref 30.0–36.0)
MCV: 103.9 fL — ABNORMAL HIGH (ref 80.0–100.0)
Platelets: 221 10*3/uL (ref 150–400)
RBC: 2.79 MIL/uL — ABNORMAL LOW (ref 3.87–5.11)
RDW: 15.4 % (ref 11.5–15.5)
WBC: 11.2 10*3/uL — ABNORMAL HIGH (ref 4.0–10.5)
nRBC: 0 % (ref 0.0–0.2)

## 2021-11-25 LAB — TRIGLYCERIDES: Triglycerides: 207 mg/dL — ABNORMAL HIGH (ref <150)

## 2021-11-25 LAB — LIPASE, BLOOD: Lipase: 39 U/L (ref 11–51)

## 2021-11-25 LAB — PHOSPHORUS: Phosphorus: 2 mg/dL — ABNORMAL LOW (ref 2.5–4.6)

## 2021-11-25 LAB — MAGNESIUM: Magnesium: 1.6 mg/dL — ABNORMAL LOW (ref 1.7–2.4)

## 2021-11-25 MED ORDER — TRAVASOL 10 % IV SOLN
INTRAVENOUS | Status: DC
  Filled 2021-11-25: qty 1017.6

## 2021-11-25 MED ORDER — POTASSIUM PHOSPHATES 15 MMOLE/5ML IV SOLN
15.0000 mmol | Freq: Once | INTRAVENOUS | Status: AC
  Administered 2021-11-25: 15 mmol via INTRAVENOUS
  Filled 2021-11-25: qty 5

## 2021-11-25 MED ORDER — VITAL HIGH PROTEIN PO LIQD
1000.0000 mL | ORAL | Status: DC
Start: 2021-11-25 — End: 2021-11-25

## 2021-11-25 MED ORDER — POTASSIUM CHLORIDE 10 MEQ/50ML IV SOLN
10.0000 meq | INTRAVENOUS | Status: AC
  Administered 2021-11-25 (×2): 10 meq via INTRAVENOUS
  Filled 2021-11-25 (×2): qty 50

## 2021-11-25 MED ORDER — POTASSIUM CHLORIDE 20 MEQ PO PACK
20.0000 meq | PACK | Freq: Once | ORAL | Status: AC
Start: 2021-11-25 — End: 2021-11-25
  Administered 2021-11-25: 20 meq
  Filled 2021-11-25: qty 1

## 2021-11-25 MED ORDER — LORAZEPAM 2 MG/ML IJ SOLN
2.0000 mg | INTRAMUSCULAR | Status: AC | PRN
  Administered 2021-11-25 (×2): 2 mg via INTRAVENOUS
  Filled 2021-11-25 (×2): qty 1

## 2021-11-25 MED ORDER — LORAZEPAM 2 MG/ML IJ SOLN
4.0000 mg | Freq: Once | INTRAMUSCULAR | Status: DC
Start: 2021-11-25 — End: 2021-11-25

## 2021-11-25 MED ORDER — FUROSEMIDE 10 MG/ML IJ SOLN
40.0000 mg | Freq: Once | INTRAMUSCULAR | Status: AC
  Administered 2021-11-25: 40 mg via INTRAVENOUS
  Filled 2021-11-25: qty 4

## 2021-11-25 MED ORDER — METOLAZONE 5 MG PO TABS
5.0000 mg | ORAL_TABLET | Freq: Once | ORAL | Status: AC
Start: 2021-11-25 — End: 2021-11-25
  Administered 2021-11-25: 5 mg via ORAL
  Filled 2021-11-25: qty 1

## 2021-11-25 MED ORDER — MAGNESIUM SULFATE 2 GM/50ML IV SOLN
2.0000 g | Freq: Once | INTRAVENOUS | Status: AC
  Administered 2021-11-25: 2 g via INTRAVENOUS
  Filled 2021-11-25: qty 50

## 2021-11-25 MED ORDER — VITAL AF 1.2 CAL PO LIQD
1000.0000 mL | ORAL | Status: DC
  Administered 2021-11-25 – 2021-11-26 (×2): 1000 mL
  Filled 2021-11-25 (×2): qty 1000

## 2021-11-25 MED ORDER — MAGNESIUM SULFATE 4 GM/100ML IV SOLN
4.0000 g | Freq: Once | INTRAVENOUS | Status: AC
Start: 2021-11-25 — End: 2021-11-25
  Administered 2021-11-25: 4 g via INTRAVENOUS
  Filled 2021-11-25: qty 100

## 2021-11-25 NOTE — Progress Notes (Signed)
Speech Language Pathology Treatment: Dysphagia;Passy Muir Speaking valve  Patient Details Name: Kara [name] MRN: [id number] DOB: 1985/05/26 Today's Date: 11/25/2021 Time: 0932-3557 SLP Time Calculation (min) (ACUTE ONLY): 30 min  Assessment / Plan / Recommendation Clinical Impression  Patient seen by SLP for skilled session focused on PMV use and PO trials. Her mother was present during session and education ongoing  related to patient's progress. Patient tolerated PMV in place for total of 25 minutes without significant change in RR or SpO2. Patient was able to phonate and speak at phrase level and voice was clear. She required a moderate amount of verbal and tactile cues from SLP and her mother as she was lethargic and had eyes closed when not attending. She tolerated small ice chips one at a time via spoon with moderate amount of verbal and tactile cues for her to attend and actively participate in taking ice chip into mouth when presented to lips and then masticating it. Suspect swallow initiation delay but no change in vitals or voice and no coughing or throat clearing observed. SLP explained to mother that primary difficulty at this time in terms of PMV toleration and ability to safely take in PO's is her lethargy/inconsistent alertness and that objective swallow test cannot be completed until she is more consistently alert. Mom verbalized understanding of this. SLP will continue to follow patient for PMV use/toleration and PO trials.     HPI HPI: Patient is a 36 year old female who presented to [facility] ED on 7/28 s/p cardiac arrest. Fire dept/EMS arrived finding patient to be in [place].  Multiple shocks delivered, epi and amnio given.  About 15 minutes until ROSC was obtained by EMS arrival.  MRI 8/2 "showed restricted diffusion in bilat posterior frontal, parietal, and superior occipital cortex c/w cytotoxic edema from hypoxic ischemic injury. Subcortical nuclei are spared."  Since BSE on 8/4, pt required  reintubation and received trach 8/6. 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      SLP Plan  Continue with current plan of care      Recommendations for follow up therapy are one component of a multi-disciplinary discharge planning process, led by the attending physician.  Recommendations may be updated based on patient status, additional functional criteria and insurance authorization.    Recommendations  Diet recommendations: NPO (PRN ice chips, water sips with PMV in place and only with nursing or SLP) Liquids provided via: Teaspoon Medication Administration: Via alternative means Supervision: Staff to assist with self feeding Compensations: Slow rate;Small sips/bites Postural Changes and/or Swallow Maneuvers: Seated upright 90 degrees      Patient may use Passy-Muir Speech Valve: During all therapies with supervision PMSV Supervision: Full MD: Please consider changing trach tube to : Cuffless         Oral Care Recommendations: Oral care QID Follow Up Recommendations: Acute inpatient rehab (3hours/day) Assistance recommended at discharge: Frequent or constant Supervision/Assistance SLP Visit Diagnosis: Aphonia (R49.1);Dysphagia, unspecified (R13.10) Plan: Continue with current plan of care          Angela Nevin, MA, CCC-SLP Speech Therapy

## 2021-11-25 NOTE — Progress Notes (Signed)
PCCM Update: Called to the bedside by eLink Team due to agitation and tachypnea.   Patient appeared less agitated at time of evaluation. She was oriented to person and city but not exact place or time. She followed simple commands. She would vary between tachypnea with some increased work of breathing and then settle in to a more comfortable pattern of respirations. She has slid down in the bed and is hunched over. Vitals were within normal limits.  She is due for her night time klonipin and gabapentin. We will add IV benadryl for sedation effects.   She is to be repositioned in the bed.  We will give 5mg  metolazone and 40mg  IV lasix for diuresis as she is 13L positive for the stay. Potassium and magnesium also ordered.  If she does not respond to the above treatment, she will likely need to be placed back on precedex and return to the ICU.  , MD [facility] Office: [phone]   See Amion for personal pager PCCM on call pager [phone] until 7pm. Please call Elink 7p-7a. [phone]

## 2021-11-25 NOTE — Progress Notes (Signed)
eLink Physician-Brief Progress Note Patient Name: Kara [name] DOB: [birth year] MRN: [id number]   Date of Service  11/25/2021  HPI/Events of Note  Nursing reports that the patient is very agitated and has increased WOB. Sat = 100% and RR = 44 at this time. RT concerned that patient may need to go back on mechanical ventilator and be transferred back to ICU. No camera in room for eLink assessment.   eICU Interventions  Plan: Will request that PCCM ground team evaluate the patient at bedside.      Intervention Category Major Interventions: Other:  Lenell Antu 11/25/2021, 9:23 PM

## 2021-11-25 NOTE — Progress Notes (Signed)
Pt resting comfortably on ATC 8L/35% at this time, with no apparent distress.

## 2021-11-25 NOTE — Progress Notes (Signed)
Nutrition Follow-up  DOCUMENTATION CODES:   Not applicable  INTERVENTION:   Continue TPN to meet 100% of nutritional needs until EN tolerance established and able to titrate feedings to goal. Given chronic pancreatitis (unable to take PERT currently), high risk for malabsorption and intolerance  Tube Feeding via Cortrak (Jejunal placement): Initiate Vital AF 1.2 at 20 ml/hr Goal rate: Vital AF 1.2 at 70 ml/hr Goal provides 126 g of protein, 2016 kcals, 1361 mL of free water  Monitor closely for signs of malabsorption (given hx of chronic pancreatitis requiring PERT) which include steatorrhea (pale, oily, foul smelling poop), worsening diarrhea-especially large volumes of stool, abdominal distention/discomfort, N/V  Recommend addition of free water via Cortrak tube secondary to free water deficit, hypernatremia  Recommend restarting Creon as soon as pt is able to safely take meds by mouth  NUTRITION DIAGNOSIS:   Inadequate oral intake related to inability to eat as evidenced by NPO status.  Being addressed via nutrition support  GOAL:   Patient will meet greater than or equal to 90% of their needs  Being addressed via   MONITOR:   Vent status, I & O's  REASON FOR ASSESSMENT:   Consult, Ventilator Assessment of nutrition requirement/status  ASSESSMENT:   36 y.o. female presented to the ED after cardiac arrest. Pt recently discharged on 7/22 from [facility] for RLE cellulitis. PMH includes HLD, GERD, CHF, EtOH abuse, HTN, and seizures. Pt admitted with V. Fib cardiac arrest, acute respiratory failure, and acute encephalopathy.  7/28 Intubated 8/03 Extubated 8/04 Cortrak placed (gastric) 8/07 TPN initiated 8/09 Cortrak tube tip advanced beyond LOT into Jejunum, TPN advanced to goal  Tolerating TC TPN increasing to goal rate of 80 ml/hr today  SLP following, remains NPO but able to have sips with PMV. Mental status remains biggest barrier to evaluating swallow at this  time  Current wt 94.1 kg; admit weight 90.9 kg  Stool amount via rectal tube 325 mL in 24 hours. Need to monitor stool output post re-initiation of TF  No new skin breakdown, pressure injuries noted per R skin assessment  Labs: phosphorus 2.0 (L), sodium 149 (H), potassium wdl, magnesium 1.6 (L) Meds: ss novolog, folic acid, thiamine, potassium phosphate, KCl, sodium bicarb   Diet Order:   Diet Order             Diet NPO time specified  Diet effective now                   EDUCATION NEEDS:   Not appropriate for education at this time  Skin:  Skin Assessment:  (non-pressure wound R knee; sternal laceration from AED)  Last BM:  340 mL in 24 hours (before TF reinitiation today)  Height:   Ht Readings from Last 1 Encounters:  11/13/21 5\' 7"  (1.702 m)    Weight:   Wt Readings from Last 1 Encounters:  11/23/21 94.1 kg    Ideal Body Weight:  61.4 kg  BMI:  Body mass index is 32.49 kg/m.  Estimated Nutritional Needs:   Kcal:  2000-2200  Protein:  100-115 grams  Fluid:  >/= 2 L   01/23/22 MS, RDN, LDN, CNSC Registered Dietitian 3 Clinical Nutrition RD Pager and On-Call Pager Number Located in [place]

## 2021-11-25 NOTE — Progress Notes (Signed)
NAME:  Kara [name], MRN:  [id number], DOB:  10/25/1985, LOS: 12 ADMISSION DATE:  11/13/2021, CONSULTATION DATE:  7/28 REFERRING MD:  Dr. 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  DVT ultrasound negative.  Patient discharged with ABX on 11/07/2021.  During hospital course patient with low K and mag.  During the past few days per husband patient has been nauseous and not able to take her medications.  She has been taking medication for nausea.  On 7/28 in the afternoon, patient's husband states he went out and about 20 minutes later found patient down on floor with agonal respirations.  He called 911 and started CPR.  Fire dept/EMS arrived finding patient to be in VT.  Multiple shocks delivered, epi and amnio given.  About 15 minutes until ROSC was obtained by EMS arrival.  Patient intubated and was given sedation.  Patient transferred to [facility] ED.  Upon arrival to [facility] on 7/28, patient intubated and sedated.  Patient on epi drip.  EKG with no ST elevation but prolonged QTc.  Cardiology consulted.  CXR with no signs of pneumonia; ETT in good position.  Pertinent ED labs: UDS WNL, UA WNL, WBC 23.9, K2.7, glucose 290, AST 178, alk phos 321, troponin 111, ABG 7.24, 45, 156, 19.  CT head and PE chest pending.  Other labs and cultures pending.  Patient noted to be having some posturing and possible myoclonic activity.  Versed given.  EEG ordered.  PCCM consulted for ICU admission.   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 MRI 8/3 extubated 8/5 reintubated 8/6 trach 8/7 doing well with PMSV. Sips and chips started.  8/9 tolerated trach collar for two days, mental status improved throughout the day and asking about dogs and work per mom  Interim History / Subjective:  No overnight events, off precedex, more oriented yesterday afternoon per mom, sleepy this morning   Objective   Blood pressure 132/82, pulse 82, temperature 98.5 F (36.9 C), temperature source Axillary, resp. rate (!) 22, height 5\' 7"  (1.702 m), weight 94.1 kg, SpO2 100 %.    FiO2 (%):  [28 %-35 %] 28 %   Intake/Output Summary (Last 24 hours) at 11/25/2021 0901 Last data filed at 11/25/2021 0600 Gross per 24 hour  Intake 1415.39 ml  Output 790 ml  Net 625.39 ml    Filed Weights   11/21/21 0500 11/22/21 0400 11/23/21 0437  Weight: 94.6 kg 101.4 kg 94.1 kg    Examination:  General: Young adult female on trach collar. Mildly restless HEENT: Trach in place. PMSV.  Neuro: Alert, oriented. Restless.  Chest: Scattered crackles.   Heart: RRR, no MRG Abdomen: Soft, NT, ND Skin: Grossly intact  Labs Creat 1.3, mag 1.6 Chl 120 Na 149  Resolved Hospital Problem list   Hx of splenic vein thrombosis: previously on warfarin complicated by abdominal hematoma requiring IR intervention, not presently on  warfarin. Hyponatremia Postextubation stridor Aspiration pneumonia Right lower leg cellulitis Thrombocytopenia due to acute illness  Assessment & Plan:   VT/Vfib Cardiac arrest with suspect long qt syndrome with subsequent Postarrest encephalopathy patient unseen for about 20 minutes; found down pulseless; found to be in VT shocked multiple times, given epi and amio; ROSC 15 after EMS arrival. Per husband has been having N/V taking anti-nausea meds. Not  taking her home meds that husband was aware of that past few days due to this.  UDS neg, alcohol neg. -MRI with some evidence of anoxic injury. Patient is restless and and agitated -required trach 8/6 - clonazepam, gabapentin, oxycodone, now off Precedex - Sertraline with close watch of QT   Hypokalemia Hypomagnesemia - Continue supportive care and replete as needed - Avoid QT prolonging meds - Continue aggressive electrolyte supplement    Seizure disorder - Patient was loaded with Vimpat, continue Vimpat twice daily - Continue to hold Topamax  Acute respiratory failure with hypoxia:  trach placed 8/6 Pulmonary edema Hx of asthma:  On Breo-Ellipta, abuterol, and singulair - wean FiO2 for sat goal > 90%, on trach collar 5L and 28% today -using PMV and working with speech, not cleared to swallow - Continue scheduled Pulmicort and Brovana, as needed albuterol   Right lower extremity cellulitis Aspiration pneumonia - Completed treatment with ceftriaxone  Chronic pain: Oxycodone at home - Oxycodone per tube as above  HTN/HLD - Continue holding home spironolactone/Crestor  AKI due to ischemic ATN - Monitor intake and output - Avoid nephrotoxic agents - DC foley  Nutrition:  enteral nutrition limited due to pancreatitis, issues with tolerating TF - starting TPN 8/8 -Cortrak advanced post-pylorus and will attempt trickle feeds -speech saw today, remains NPO but able to have sips with PMV   Best Practice (right click and "Reselect all SmartList Selections" daily)   Diet/type: TPN DVT prophylaxis: Heparin GI prophylaxis: PPI Lines: PICC Foley: no Code Status:  full code Last date of multidisciplinary goals of care discussion [8/9 mother updated at bedside.]    Critical care time 36 minutes  Darcella Gasman Bernabe Dorce, PA-C [facility] See Amion for pager If no response to pager , please call [phone] until 7pm After 7:00 pm call Elink   [phone]

## 2021-11-25 NOTE — Progress Notes (Signed)
Cortrak Tube Team Note:  Order received to advance cortrak tube into the small bowel past the ligament of treitz. Advanced existing tube to 100cm and updated LDA documentation.  X-ray is required, abdominal x-ray has been ordered by the Cortrak team. Please confirm tube placement before using the Cortrak tube.   If the tube becomes dislodged please keep the tube and contact the Cortrak team at [email or web address] (password [email or web address]) for replacement.  If after hours and replacement cannot be delayed, place a NG tube and confirm placement with an abdominal x-ray.    Greig Castilla, RD, LDN Clinical Dietitian RD pager # available in AMION  After hours/weekend pager # available in [facility]

## 2021-11-25 NOTE — Progress Notes (Signed)
Occupational Therapy Treatment Patient Details Name: Kara [name] MRN: [id number] DOB: 03-Apr-1986 Today's Date: 11/25/2021   History of present illness Pt is a 36 y.o. female admitted 11/13/21 with v-fib/VT cardiac arrest, some posturing and possible myoclonic activity; UDS (-), EEG (-). YQM:VHQIONGEXB diffusion in bil posterior frontal, parietal, and superior occipital cortex, concerning for cytotoxic edema from hypoxic ischemic injury. ETT 7/28-8/3. Reintubated 8/5 and trach with bronch 8/6.PMHx:HF, HLD, seizure, ETOH use, asthma, anxiety, chronic pain, long QT syndrome.   OT comments  Patient very lethargic this session, had just competed bedlevel wash with RN, and SLP with PMV trial.  Patient with occasional opening of eyes during PROM, and grimacing noted with repositioning in bed.  Left in semi-chair position.  OT will continue efforts in the acute setting.    Recommendations for follow up therapy are one component of a multi-disciplinary discharge planning process, led by the attending physician.  Recommendations may be updated based on patient status, additional functional criteria and insurance authorization.    Follow Up Recommendations  Skilled nursing-short term rehab (<3 hours/day)    Assistance Recommended at Discharge Frequent or constant Supervision/Assistance  Patient can return home with the following  Two people to help with walking and/or transfers;Two people to help with bathing/dressing/bathroom;Direct supervision/assist for medications management;Help with stairs or ramp for entrance;Assist for transportation;Assistance with Charity fundraiser (measurements OT);Wheelchair cushion (measurements OT)    Recommendations for Other Services      Precautions / Restrictions Precautions Precautions: Fall Precaution Comments: Watch HR and O2 sats, Cortrak, trach, flexiseal Restrictions Weight Bearing Restrictions: No       Mobility  Bed Mobility   Bed Mobility: Rolling Rolling: Mod assist, Max assist              Transfers                         Balance                                           ADL either performed or assessed with clinical judgement   ADL                                         General ADL Comments: Dep with ADL at this time bedlevel    Extremity/Trunk Assessment Upper Extremity Assessment RUE Deficits / Details: ocassional nonpurposeful movement noted LUE Deficits / Details: ocassional nonpurposeful movement noted   Lower Extremity Assessment Lower Extremity Assessment: Defer to PT evaluation                          Cognition Arousal/Alertness: Lethargic Behavior During Therapy: Flat affect, Restless Overall Cognitive Status: Difficult to assess                                          Exercises General Exercises - Upper Extremity Shoulder Flexion: PROM, Supine Shoulder Extension: PROM, Supine Shoulder ABduction: PROM, Supine Shoulder ADduction: PROM, Supine General Exercises - Lower Extremity Heel Slides: PROM, Supine Hip ABduction/ADduction: PROM, Supine           General  Comments      Pertinent Vitals/ Pain       Pain Assessment Faces Pain Scale: Hurts a little bit Pain Location: generalized with LE HEP Pain Descriptors / Indicators: Grimacing Pain Intervention(s): Monitored during session                                                          Frequency  Min 2X/week        Progress Toward Goals  OT Goals(current goals can now be found in the care plan section)  Progress towards OT goals: Not progressing toward goals - comment (decreased LOA)  Acute Rehab OT Goals OT Goal Formulation: Patient unable to participate in goal setting Time For Goal Achievement: 12/04/21 Potential to Achieve Goals: Fair  Plan Discharge plan remains appropriate     Co-evaluation                 AM-PAC OT "6 Clicks" Daily Activity     Outcome Measure   Help from another person eating meals?: Total Help from another person taking care of personal grooming?: Total Help from another person toileting, which includes using toliet, bedpan, or urinal?: Total Help from another person bathing (including washing, rinsing, drying)?: Total Help from another person to put on and taking off regular upper body clothing?: Total Help from another person to put on and taking off regular lower body clothing?: Total 6 Click Score: 6    End of Session Equipment Utilized During Treatment: Oxygen  OT Visit Diagnosis: Unsteadiness on feet (R26.81);Muscle weakness (generalized) (M62.81);Other symptoms and signs involving cognitive function   Activity Tolerance Patient limited by lethargy   [name];with call bell/phone within reach;with family/visitor present   Nurse Communication Other (comment)        Time: 1610-9604 OT Time Calculation (min): 18 min  Charges: OT General Charges $OT Visit: 1 Visit OT Treatments $Therapeutic Activity: 8-22 mins  11/25/2021  RP, OTR/L  Acute Rehabilitation Services  Office:  [phone]   [name] 11/25/2021, 10:56 AM

## 2021-11-25 NOTE — Progress Notes (Signed)
Passy-Muir Speaking Valve Assessment    11/23/21 1053  SLP Visit Information  SLP Received On 11/23/21  Pain Assessment  Pain Assessment Faces  Faces Pain Scale 2  Facial Expression 1  Body Movements 1  Muscle Tension 1  Pain Location back?  Pain Descriptors / Indicators Discomfort  Pain Intervention(s) Monitored during session  General Information  HPI Patient is a 36 year old female who presented to [facility] ED on 7/28 s/p cardiac arrest. Fire dept/EMS arrived finding patient to be in [place].  Multiple shocks delivered, epi and amnio given.  About 15 minutes until ROSC was obtained by EMS arrival.  MRI 8/2 "showed restricted diffusion in bilat posterior frontal, parietal, and superior occipital cortex c/w cytotoxic edema from hypoxic ischemic injury. Subcortical nuclei are spared."  Since BSE on 8/4, pt required reintubation and received trach 8/6. 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  Cuff Deflation Trial  Tolerated Cuff Deflation  (deflated on arrival)  Behavior Restless;Confused (awake)  PMSV Trial  PMSV was placed for  (intervals of 3-4 min up to 15 min)  Able to redirect subglottic air through upper airway Yes  Able to Attain Phonation Yes  Voice Quality Normal  Able to Expectorate Secretions Yes  Level of Secretion Expectoration with PMSV Tracheal  Breath Support for Phonation Mildly decreased  Intelligibility Intelligibility reduced  Word 75-100% accurate  Phrase 75-100% accurate  Sentence 50-74% accurate  Respirations During Trial  (22-26)  SpO2 During Trial 97 %  Pulse During Trial  (95-99)  Behavior Confused (awake)  SLP - End of Session  [name];with family/visitor present  Nurse Communication PMSV use/precautions;Treatment plan  Assessment  Clinical Impression Statement (ACUTE ONLY) Pt's cuff was deflated on arrival and she was awake,  intermittently confused, restless and distracted throughout assessment as her sedation is being weaned down. PMV was initially coughed off then as session progressed valve remained on trach hub without indications of back pressure. She spoke at sentence level with approximately 75% accuracy with normal quality of voice and intensity. RR 22-26, HR 95-99 and SpO2 97%. She was able to converse with RN and mom at bedside. Recommend pt wear PMV with staff, full supervision, ensure cuff is deflated.  SLP Visit Diagnosis Aphonia (R49.1)  SLP Recommendation/Assessment Patient needs continued Speech Lanaguage Pathology Services  Problem List  (aphonia)  PMSV - Therapy Diagnosis Aphonia  Plan  Speech Therapy Frequency (ACUTE ONLY) min 2x/week  Duration 2 weeks  Treatment/Interventions SLP instruction and feedback;Compensatory strategies;Patient/family education  Potential to Achieve Goals (ACUTE ONLY) Good  SLP Recommendations  Patient may use Passy-Muir Speech Valve During all therapies with supervision  PMSV Supervision Full  MD: Please consider changing trach tube to  Cuffless  Recommendations for Other Services Rehab consult  Follow Up Recommendations Acute inpatient rehab (3hours/day)  Assistance recommended at discharge Frequent or constant Supervision/Assistance  Functional Status Assessment Patient has had a recent decline in their functional status and demonstrates the ability to make significant improvements in function in a reasonable and predictable amount of time.  Individuals Consulted  Consulted and Agree with Results and Recommendations Patient;Family member/caregiver  Family Member Consulted  mom  SLP Time Calculation  SLP Start Time (ACUTE ONLY) 0945  SLP Stop Time (ACUTE ONLY) 1002  SLP Time Calculation (min) (ACUTE ONLY) 17 min  SLP Evaluations  $ SLP Speech Visit 1 Visit  SLP Evaluations  $ SLP  Eval Voice Prosthetic Device Procedure  $$ Passy Muir Speaking Valve yes   Kara [name] Kara [name].Ed Architect [phone]

## 2021-11-25 NOTE — Progress Notes (Addendum)
PHARMACY - TOTAL PARENTERAL NUTRITION CONSULT NOTE   Indication: Tube feed intolerance   Patient Measurements: Height: 5\' 7"  (170.2 cm) Weight: 94.1 kg (207 lb 7.3 oz) IBW/kg (Calculated) : 61.6   Body mass index is 32.49 kg/m. Usual Weight: 200 lbs per chart   Assessment:  36 yo W with with VT/Vfib cardiac arrest s/p about 20 minutes of CPR before ROSC with anoxic brain injury. Per family, patient had  chronic NVD and labile food tolerance prior to admission (she could tolerate some foods but others would send her to the bathroom for 1 hour or cause vomiting). Unclear if food intolerance is due to chronic pancreatitis or other cause. Since admission, tried vital high protein, vital AF 1.2 cal, and vital 1.5 cal at different rates but patient continued to have vomiting and diarrhea despite low rates. Now with rectal tube. Tube feeds discontinued 8/4. Pharmacy consulted for TPN.   Glucose / Insulin: BG < 180, A1C 4.5, received 5 units SSI/24 hrs Electrolytes: Na 149, K 3.6, Phos 2, Mg 1.6 CO2 22 Renal: Scr 2.22>1.66>1.31 (BL 1)  Hepatic: T. Bili 1.8, other WNL, Albumin 2, triglycerides 231 Intake / Output; MIVF: UOP 0.9 ml/kg/hr. FMS 10/4, net + 13.3 L  GI Imaging: 7/28 KUB: Dilated common bile duct with possible choledocholithiasis 7/29 KUB: Relative paucity of bowel gas in the visualized abdomen. 7/31 KUB: Nonobstructive bowel gas pattern. 8/1 KUB: Increasing gaseous distention of small bowel and right colon, nonspecific GI Surgeries / Procedures: none  Central access: 8/6  TPN start date: 8/7   Nutritional Goals: Goal TPN rate is 80 mL/hr (provides 102 g (53 g/L) of protein, 298 g dextrose (15.5%), 63 g lipids (33 g/L) and 2053 kcals per day  RD Assessment: Estimated Needs Total Energy Estimated Needs: 2000-2200 Total Protein Estimated Needs: 100-115 grams Total Fluid Estimated Needs: >/= 2 L  Current Nutrition:  NPO  Plan:  Increase TPN on 8/9 to goal rate of 80 ml/hr  which will provide about 100% of needs Electrolytes in TPN: Na 0 mEq/L, K 45 mEq/L, Ca 5 mEq/L, Mg 7 mEq/L, Phos 7 mmol/L, Max acetate  Add standard MVI and trace elements to TPN  Continue sensitive SSI frequency q4hrs Kcl 10 meq x 2 Mag 4 gm x 1 Kphos 15 mmol x 1 Standard TPN labs on Mon and Thurs and daily during initiation, recheck triglycerides  Thu, PharmD, [facility] Clinical Pharmacist Please see AMION for all Pharmacists' Contact Phone Numbers 11/25/2021, 9:34 AM

## 2021-11-26 ENCOUNTER — Ambulatory Visit: Admit: 2021-11-26 | Payer: BC Managed Care – PPO | Source: Other Acute Inpatient Hospital

## 2021-11-26 ENCOUNTER — Institutional Professional Consult (permissible substitution)
Admit: 2021-11-26 | Discharge: 2021-12-09 | Disposition: A | Discharge status: Rehabilitation Facility | Payer: BC Managed Care – PPO | Source: Other Acute Inpatient Hospital

## 2021-11-26 ENCOUNTER — Other Ambulatory Visit (HOSPITAL_COMMUNITY): Payer: Self-pay

## 2021-11-26 DIAGNOSIS — N179 Acute kidney failure, unspecified: Secondary | ICD-10-CM | POA: Diagnosis not present

## 2021-11-26 DIAGNOSIS — G9340 Encephalopathy, unspecified: Secondary | ICD-10-CM | POA: Diagnosis not present

## 2021-11-26 DIAGNOSIS — E876 Hypokalemia: Secondary | ICD-10-CM | POA: Diagnosis not present

## 2021-11-26 DIAGNOSIS — I469 Cardiac arrest, cause unspecified: Secondary | ICD-10-CM | POA: Diagnosis not present

## 2021-11-26 DIAGNOSIS — J9601 Acute respiratory failure with hypoxia: Secondary | ICD-10-CM | POA: Diagnosis not present

## 2021-11-26 DIAGNOSIS — G40909 Epilepsy, unspecified, not intractable, without status epilepticus: Secondary | ICD-10-CM

## 2021-11-26 DIAGNOSIS — J9621 Acute and chronic respiratory failure with hypoxia: Secondary | ICD-10-CM

## 2021-11-26 DIAGNOSIS — F1011 Alcohol abuse, in remission: Secondary | ICD-10-CM | POA: Diagnosis present

## 2021-11-26 DIAGNOSIS — G931 Anoxic brain damage, not elsewhere classified: Secondary | ICD-10-CM

## 2021-11-26 DIAGNOSIS — Z930 Tracheostomy status: Secondary | ICD-10-CM

## 2021-11-26 DIAGNOSIS — Z8674 Personal history of sudden cardiac arrest: Secondary | ICD-10-CM

## 2021-11-26 LAB — PHOSPHORUS: Phosphorus: 2.5 mg/dL (ref 2.5–4.6)

## 2021-11-26 LAB — COMPREHENSIVE METABOLIC PANEL
ALT: 23 U/L (ref 0–44)
AST: 41 U/L (ref 15–41)
Albumin: 2.3 g/dL — ABNORMAL LOW (ref 3.5–5.0)
Alkaline Phosphatase: 303 U/L — ABNORMAL HIGH (ref 38–126)
Anion gap: 6 (ref 5–15)
BUN: 18 mg/dL (ref 6–20)
CO2: 25 mmol/L (ref 22–32)
Calcium: 8.6 mg/dL — ABNORMAL LOW (ref 8.9–10.3)
Chloride: 114 mmol/L — ABNORMAL HIGH (ref 98–111)
Creatinine, Ser: 1.01 mg/dL — ABNORMAL HIGH (ref 0.44–1.00)
GFR, Estimated: >60 mL/min (ref >60)
Glucose, Bld: 219 mg/dL — ABNORMAL HIGH (ref 70–99)
Potassium: 3.9 mmol/L (ref 3.5–5.1)
Sodium: 145 mmol/L (ref 135–145)
Total Bilirubin: 1.2 mg/dL (ref 0.3–1.2)
Total Protein: 6.7 g/dL (ref 6.5–8.1)

## 2021-11-26 LAB — GLUCOSE, CAPILLARY
Glucose-Capillary: 159 mg/dL — ABNORMAL HIGH (ref 70–99)
Glucose-Capillary: 203 mg/dL — ABNORMAL HIGH (ref 70–99)
Glucose-Capillary: 216 mg/dL — ABNORMAL HIGH (ref 70–99)
Glucose-Capillary: 219 mg/dL — ABNORMAL HIGH (ref 70–99)
Glucose-Capillary: 255 mg/dL — ABNORMAL HIGH (ref 70–99)

## 2021-11-26 LAB — MAGNESIUM: Magnesium: 2.1 mg/dL (ref 1.7–2.4)

## 2021-11-26 LAB — TRIGLYCERIDES: Triglycerides: 200 mg/dL — ABNORMAL HIGH (ref <150)

## 2021-11-26 LAB — C DIFFICILE QUICK SCREEN W PCR REFLEX
C Diff antigen: NEGATIVE
C Diff interpretation: NOT DETECTED
C Diff toxin: NEGATIVE

## 2021-11-26 MED ORDER — PSYLLIUM 95 % PO PACK
1.0000 | PACK | Freq: Every day | ORAL | Status: DC
  Administered 2021-11-26: 1 via ORAL
  Filled 2021-11-26: qty 1

## 2021-11-26 MED ORDER — CHLORHEXIDINE GLUCONATE CLOTH 2 % EX PADS: 6.0000 | MEDICATED_PAD | Freq: Every day | CUTANEOUS | Status: DC

## 2021-11-26 MED ORDER — HEPARIN SODIUM (PORCINE) 5000 UNIT/ML IJ SOLN: 5000.0000 [IU] | Freq: Three times a day (TID) | INTRAMUSCULAR | Status: DC

## 2021-11-26 MED ORDER — CLONAZEPAM 2 MG PO TABS: 2.0000 mg | ORAL_TABLET | Freq: Two times a day (BID) | ORAL | 0 refills | Status: DC

## 2021-11-26 MED ORDER — ALBUTEROL SULFATE (2.5 MG/3ML) 0.083% IN NEBU
2.5000 mg | INHALATION_SOLUTION | RESPIRATORY_TRACT | 12 refills | Status: DC | PRN
Start: 2021-11-26 — End: 2021-12-15

## 2021-11-26 MED ORDER — ARFORMOTEROL TARTRATE 15 MCG/2ML IN NEBU: 15.0000 ug | INHALATION_SOLUTION | Freq: Two times a day (BID) | RESPIRATORY_TRACT | Status: DC

## 2021-11-26 MED ORDER — INSULIN ASPART 100 UNIT/ML IJ SOLN: 0.0000 [IU] | INTRAMUSCULAR | 11 refills | Status: DC

## 2021-11-26 MED ORDER — THIAMINE MONONITRATE 100 MG PO TABS
100.0000 mg | ORAL_TABLET | Freq: Every day | ORAL | Status: DC
  Administered 2021-11-26: 100 mg
  Filled 2021-11-26 (×2): qty 1

## 2021-11-26 MED ORDER — SODIUM CHLORIDE 0.9% FLUSH: 10.0000 mL | Freq: Two times a day (BID) | INTRAVENOUS | Status: DC

## 2021-11-26 MED ORDER — TRAVASOL 10 % IV SOLN
INTRAVENOUS | Status: DC
  Filled 2021-11-26: qty 1017.6

## 2021-11-26 MED ORDER — ACETAMINOPHEN 160 MG/5ML PO SOLN
650.0000 mg | Freq: Three times a day (TID) | ORAL | 0 refills | Status: DC | PRN
Start: 2021-11-26 — End: 2021-12-15

## 2021-11-26 MED ORDER — INSULIN ASPART 100 UNIT/ML IJ SOLN
0.0000 [IU] | Freq: Once | INTRAMUSCULAR | Status: AC
  Administered 2021-11-26: 5 [IU] via SUBCUTANEOUS

## 2021-11-26 MED ORDER — POTASSIUM CHLORIDE 20 MEQ PO PACK
40.0000 meq | PACK | Freq: Two times a day (BID) | ORAL | Status: DC
  Administered 2021-11-26: 40 meq via ORAL
  Filled 2021-11-26: qty 2

## 2021-11-26 MED ORDER — PANTOPRAZOLE SODIUM 40 MG PO PACK: 40.0000 mg | PACK | Freq: Every day | ORAL | Status: DC

## 2021-11-26 MED ORDER — LORAZEPAM 2 MG/ML IJ SOLN
2.0000 mg | Freq: Four times a day (QID) | INTRAMUSCULAR | 0 refills | Status: DC | PRN
Start: 2021-11-26 — End: 2021-12-15

## 2021-11-26 MED ORDER — PSYLLIUM 95 % PO PACK: 1.0000 | PACK | Freq: Every day | ORAL | Status: DC

## 2021-11-26 MED ORDER — VITAMIN B-1 100 MG PO TABS: 100.0000 mg | ORAL_TABLET | Freq: Every day | ORAL | Status: DC

## 2021-11-26 MED ORDER — INSULIN ASPART 100 UNIT/ML IJ SOLN
0.0000 [IU] | INTRAMUSCULAR | Status: DC
  Administered 2021-11-26: 5 [IU] via SUBCUTANEOUS

## 2021-11-26 MED ORDER — FOLIC ACID 1 MG PO TABS: 1.0000 mg | ORAL_TABLET | Freq: Every day | ORAL | Status: DC

## 2021-11-26 MED ORDER — VITAL AF 1.2 CAL PO LIQD: 1000.0000 mL | ORAL | Status: DC

## 2021-11-26 MED ORDER — SODIUM CHLORIDE 0.9% FLUSH: 10.0000 mL | INTRAVENOUS | Status: DC | PRN

## 2021-11-26 MED ORDER — BUDESONIDE 0.25 MG/2ML IN SUSP: 0.2500 mg | Freq: Two times a day (BID) | RESPIRATORY_TRACT | 12 refills | Status: AC

## 2021-11-26 MED ORDER — SODIUM CHLORIDE 0.9 % IV SOLN: 100.0000 mg | Freq: Two times a day (BID) | INTRAVENOUS | Status: DC

## 2021-11-26 MED ORDER — POTASSIUM CHLORIDE 20 MEQ PO PACK: 40.0000 meq | PACK | Freq: Two times a day (BID) | ORAL | Status: DC

## 2021-11-26 MED ORDER — POLYVINYL ALCOHOL 1.4 % OP SOLN: 1.0000 [drp] | OPHTHALMIC | 0 refills | Status: AC | PRN

## 2021-11-26 NOTE — TOC Transition Note (Signed)
Transition of Care [facility]) - CM/SW Discharge Note   Patient Details  Name: Kara [name] MRN: [id number] Date of Birth: 1985/12/19  [facility]) CM/SW Contact:  Gala Lewandowsky, RN Phone Number: 11/26/2021, 2:56 PM   Clinical Narrative: Insurance approved LTAC- bed is available today and patient will transition to [facility]. No further needs identified by Case Manager at this time.     Final next level of care: Long Term Acute Care (LTAC) Barriers to Discharge: No Barriers Identified   Patient Goals and CMS Choice   CMS Medicare.gov Compare Post Acute Care list provided to:: Patient Represenative (must comment) (patients spouse) Choice offered to / list presented to : Parent  Discharge Plan and Services In-house Referral: Clinical Social Work Discharge Planning Services: CM Consult Post Acute Care Choice: Long Term Acute Care (LTAC)              Readmission Risk Interventions     No data to display

## 2021-11-26 NOTE — Discharge Summary (Signed)
Physician Discharge Summary  Kara [name] FGH:[id number] DOB: 1985/07/15 DOA: 11/13/2021  PCP: Lawerance Cruel, MD  Admit date: 11/13/2021 Discharge date: 11/26/2021  Admitted From: Home Disposition:  LTACH  Recommendations for Outpatient Follow-up:  Will need to be further evaluated for swallowing a needed PEG tube in the future.  Home Health:No Equipment/Devices:none  Discharge Condition:Stable CODE STATUS:Full Diet recommendation: Heart Healthy  Brief/Interim Summary: 36 y.o. 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 MRI 8/3 extubated 8/5 reintubated 8/6 trach 8/7 doing well with PMSV. Sips and chips started.  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 UDS negative alcohol negative. MRI show evidence of anoxic brain injury. Patient is status post trach on 11/22/2021. Currently on clonazepam gabapentin oxycodone, she did need Precedex intermittently which is now has been weaned off. She is continue on  sertraline. Physical therapy evaluated the patient recommended inpatient rehab. Speech evaluated the patient recommended patient to be n.p.o. as she is high risk for aspiration. Core track was placed about 5 days prior to this dictation. Had a discussion with the family about PEG tube placement this will have to be readdressed at [facility] facility.  Postarrest encephalopathy: Neurology was consulted recommended gabapentin and clonazepam. Currently on Vimpat for seizure precautions, if the patient becomes lethargic we can probably decrease her gabapentin as tolerated. Her home dose of Topamax was discontinued.   Electrolyte imbalance hypokalemia/hypomagnesemia: With concerns of prolonged QTc.  Keep the potassium greater than 4 magnesium greater than 2.  Continue supplementation per core track.  Seizure disorder: No evidence of seizure in house continue Vimpat. Topamax was discontinued.  Acute respiratory failure with hypoxia due to V-fib VT arrest: Intubated on admission, trach placed on 11/22/2021. Continue inhalers.  Right lower extremity cellulitis/aspiration pneumonia: She completed her antibiotic course in-house.  Chronic pain syndrome: Continue oxycodone.  Essential hypertension: Currently on no antihypertensive medication.  ATN: Avoid nephrotoxic drugs likely due to prerenal azotemia in the setting of cardiac arrest she was fluid resuscitated her creatinine returned to baseline.  Nutrition: Started on TPN on 11/24/2021. Core track placed on 11/21/2021 advance postpyloric due to her history of chronic pancreatitis. She is currently tolerating trickle feeds at 20 cc an hour continue to advance as tolerated.  Shock liver: Now resolved.  Discharge Instructions  Discharge Instructions     Diet - low sodium heart healthy   Complete by: As directed    Discharge wound care:   Complete by: As directed    As per wound care instructions   Increase activity slowly   Complete  Physician Discharge Summary  Kara [name] FGH:[id number] DOB: 1985/07/15 DOA: 11/13/2021  PCP: Lawerance Cruel, MD  Admit date: 11/13/2021 Discharge date: 11/26/2021  Admitted From: Home Disposition:  LTACH  Recommendations for Outpatient Follow-up:  Will need to be further evaluated for swallowing a needed PEG tube in the future.  Home Health:No Equipment/Devices:none  Discharge Condition:Stable CODE STATUS:Full Diet recommendation: Heart Healthy  Brief/Interim Summary: 36 y.o. 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 MRI 8/3 extubated 8/5 reintubated 8/6 trach 8/7 doing well with PMSV. Sips and chips started.  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 UDS negative alcohol negative. MRI show evidence of anoxic brain injury. Patient is status post trach on 11/22/2021. Currently on clonazepam gabapentin oxycodone, she did need Precedex intermittently which is now has been weaned off. She is continue on  sertraline. Physical therapy evaluated the patient recommended inpatient rehab. Speech evaluated the patient recommended patient to be n.p.o. as she is high risk for aspiration. Core track was placed about 5 days prior to this dictation. Had a discussion with the family about PEG tube placement this will have to be readdressed at [facility] facility.  Postarrest encephalopathy: Neurology was consulted recommended gabapentin and clonazepam. Currently on Vimpat for seizure precautions, if the patient becomes lethargic we can probably decrease her gabapentin as tolerated. Her home dose of Topamax was discontinued.   Electrolyte imbalance hypokalemia/hypomagnesemia: With concerns of prolonged QTc.  Keep the potassium greater than 4 magnesium greater than 2.  Continue supplementation per core track.  Seizure disorder: No evidence of seizure in house continue Vimpat. Topamax was discontinued.  Acute respiratory failure with hypoxia due to V-fib VT arrest: Intubated on admission, trach placed on 11/22/2021. Continue inhalers.  Right lower extremity cellulitis/aspiration pneumonia: She completed her antibiotic course in-house.  Chronic pain syndrome: Continue oxycodone.  Essential hypertension: Currently on no antihypertensive medication.  ATN: Avoid nephrotoxic drugs likely due to prerenal azotemia in the setting of cardiac arrest she was fluid resuscitated her creatinine returned to baseline.  Nutrition: Started on TPN on 11/24/2021. Core track placed on 11/21/2021 advance postpyloric due to her history of chronic pancreatitis. She is currently tolerating trickle feeds at 20 cc an hour continue to advance as tolerated.  Shock liver: Now resolved.  Discharge Instructions  Discharge Instructions     Diet - low sodium heart healthy   Complete by: As directed    Discharge wound care:   Complete by: As directed    As per wound care instructions   Increase activity slowly   Complete  Physician Discharge Summary  Kara [name] FGH:[id number] DOB: 1985/07/15 DOA: 11/13/2021  PCP: Lawerance Cruel, MD  Admit date: 11/13/2021 Discharge date: 11/26/2021  Admitted From: Home Disposition:  LTACH  Recommendations for Outpatient Follow-up:  Will need to be further evaluated for swallowing a needed PEG tube in the future.  Home Health:No Equipment/Devices:none  Discharge Condition:Stable CODE STATUS:Full Diet recommendation: Heart Healthy  Brief/Interim Summary: 36 y.o. 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 MRI 8/3 extubated 8/5 reintubated 8/6 trach 8/7 doing well with PMSV. Sips and chips started.  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 UDS negative alcohol negative. MRI show evidence of anoxic brain injury. Patient is status post trach on 11/22/2021. Currently on clonazepam gabapentin oxycodone, she did need Precedex intermittently which is now has been weaned off. She is continue on  sertraline. Physical therapy evaluated the patient recommended inpatient rehab. Speech evaluated the patient recommended patient to be n.p.o. as she is high risk for aspiration. Core track was placed about 5 days prior to this dictation. Had a discussion with the family about PEG tube placement this will have to be readdressed at [facility] facility.  Postarrest encephalopathy: Neurology was consulted recommended gabapentin and clonazepam. Currently on Vimpat for seizure precautions, if the patient becomes lethargic we can probably decrease her gabapentin as tolerated. Her home dose of Topamax was discontinued.   Electrolyte imbalance hypokalemia/hypomagnesemia: With concerns of prolonged QTc.  Keep the potassium greater than 4 magnesium greater than 2.  Continue supplementation per core track.  Seizure disorder: No evidence of seizure in house continue Vimpat. Topamax was discontinued.  Acute respiratory failure with hypoxia due to V-fib VT arrest: Intubated on admission, trach placed on 11/22/2021. Continue inhalers.  Right lower extremity cellulitis/aspiration pneumonia: She completed her antibiotic course in-house.  Chronic pain syndrome: Continue oxycodone.  Essential hypertension: Currently on no antihypertensive medication.  ATN: Avoid nephrotoxic drugs likely due to prerenal azotemia in the setting of cardiac arrest she was fluid resuscitated her creatinine returned to baseline.  Nutrition: Started on TPN on 11/24/2021. Core track placed on 11/21/2021 advance postpyloric due to her history of chronic pancreatitis. She is currently tolerating trickle feeds at 20 cc an hour continue to advance as tolerated.  Shock liver: Now resolved.  Discharge Instructions  Discharge Instructions     Diet - low sodium heart healthy   Complete by: As directed    Discharge wound care:   Complete by: As directed    As per wound care instructions   Increase activity slowly   Complete  above the carina. Enteric tube extends below the diaphragm with side-port in the proximal stomach and tip beyond the inferior margin of the image. Cardiomegaly with vascular congestion. No focal consolidation, pleural effusion, pneumothorax. No acute osseous pathology. IMPRESSION: 1. Endotracheal tube with tip approximately 1 cm above the carina. 2. Cardiomegaly with vascular congestion. Electronically Signed   By: Anner Crete M.D.   On: 11/16/2021 00:27   US RENAL  Result Date: 11/15/2021 CLINICAL DATA:  Acute renal insufficiency EXAM: RENAL / URINARY TRACT ULTRASOUND COMPLETE COMPARISON:  None Available. FINDINGS: Right Kidney: Renal measurements: 12.4 x 5.0 x 5.4 cm = volume: 174 mL. Limited evaluation without obvious abnormality. Left Kidney: Renal measurements: 12.7 x 5.1 x 5.3 cm = volume: 177 mL. Limited evaluation without obvious abnormality. Bladder: Not visualized Other: Moderate ascites. IMPRESSION: The study is limited but the kidneys are grossly unremarkable. The bladder was not visualized. Moderate ascites. Electronically Signed   By: Dorise Bullion III M.D.   On: 11/15/2021 11:49   VAS [place] LOWER EXTREMITY VENOUS (DVT)  Result Date: 11/15/2021  Lower Venous DVT Study Patient Name:  Kara [name]  Date of Exam:   11/14/2021 Medical Rec #: [id number]      Accession #:     [id number] Date of Birth: 03-16-1986       Patient Gender: F Patient Age:   36 years Exam Location:  [facility] Procedure:      VAS [place] LOWER EXTREMITY VENOUS (DVT) Referring Phys: Jenny Reichmann PAYNE --------------------------------------------------------------------------------  Indications: Edema. Other Indications: Status post cardiac arrest. Comparison Study: No prior study on file Performing Technologist: Sharion Dove RVS  Examination Guidelines: A complete evaluation includes B-mode imaging, spectral Doppler, color Doppler, and power Doppler as needed of all accessible portions of each vessel. Bilateral testing is considered an integral part of a complete examination. Limited examinations for reoccurring indications may be performed as noted. The reflux portion of the exam is performed with the patient in reverse Trendelenburg.  +---------+---------------+---------+-----------+----------+--------------+ RIGHT    CompressibilityPhasicitySpontaneityPropertiesThrombus Aging +---------+---------------+---------+-----------+----------+--------------+ CFV      Full           Yes      Yes                                 +---------+---------------+---------+-----------+----------+--------------+ SFJ      Full                                                        +---------+---------------+---------+-----------+----------+--------------+ FV Prox  Full                                                        +---------+---------------+---------+-----------+----------+--------------+ FV Mid   Full                                                        +---------+---------------+---------+-----------+----------+--------------+ FV DistalFull                                                        +---------+---------------+---------+-----------+----------+--------------+  Physician Discharge Summary  Kara [name] FGH:[id number] DOB: 1985/07/15 DOA: 11/13/2021  PCP: Lawerance Cruel, MD  Admit date: 11/13/2021 Discharge date: 11/26/2021  Admitted From: Home Disposition:  LTACH  Recommendations for Outpatient Follow-up:  Will need to be further evaluated for swallowing a needed PEG tube in the future.  Home Health:No Equipment/Devices:none  Discharge Condition:Stable CODE STATUS:Full Diet recommendation: Heart Healthy  Brief/Interim Summary: 36 y.o. 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 MRI 8/3 extubated 8/5 reintubated 8/6 trach 8/7 doing well with PMSV. Sips and chips started.  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 UDS negative alcohol negative. MRI show evidence of anoxic brain injury. Patient is status post trach on 11/22/2021. Currently on clonazepam gabapentin oxycodone, she did need Precedex intermittently which is now has been weaned off. She is continue on  sertraline. Physical therapy evaluated the patient recommended inpatient rehab. Speech evaluated the patient recommended patient to be n.p.o. as she is high risk for aspiration. Core track was placed about 5 days prior to this dictation. Had a discussion with the family about PEG tube placement this will have to be readdressed at [facility] facility.  Postarrest encephalopathy: Neurology was consulted recommended gabapentin and clonazepam. Currently on Vimpat for seizure precautions, if the patient becomes lethargic we can probably decrease her gabapentin as tolerated. Her home dose of Topamax was discontinued.   Electrolyte imbalance hypokalemia/hypomagnesemia: With concerns of prolonged QTc.  Keep the potassium greater than 4 magnesium greater than 2.  Continue supplementation per core track.  Seizure disorder: No evidence of seizure in house continue Vimpat. Topamax was discontinued.  Acute respiratory failure with hypoxia due to V-fib VT arrest: Intubated on admission, trach placed on 11/22/2021. Continue inhalers.  Right lower extremity cellulitis/aspiration pneumonia: She completed her antibiotic course in-house.  Chronic pain syndrome: Continue oxycodone.  Essential hypertension: Currently on no antihypertensive medication.  ATN: Avoid nephrotoxic drugs likely due to prerenal azotemia in the setting of cardiac arrest she was fluid resuscitated her creatinine returned to baseline.  Nutrition: Started on TPN on 11/24/2021. Core track placed on 11/21/2021 advance postpyloric due to her history of chronic pancreatitis. She is currently tolerating trickle feeds at 20 cc an hour continue to advance as tolerated.  Shock liver: Now resolved.  Discharge Instructions  Discharge Instructions     Diet - low sodium heart healthy   Complete by: As directed    Discharge wound care:   Complete by: As directed    As per wound care instructions   Increase activity slowly   Complete  Physician Discharge Summary  Kara [name] FGH:[id number] DOB: 1985/07/15 DOA: 11/13/2021  PCP: Lawerance Cruel, MD  Admit date: 11/13/2021 Discharge date: 11/26/2021  Admitted From: Home Disposition:  LTACH  Recommendations for Outpatient Follow-up:  Will need to be further evaluated for swallowing a needed PEG tube in the future.  Home Health:No Equipment/Devices:none  Discharge Condition:Stable CODE STATUS:Full Diet recommendation: Heart Healthy  Brief/Interim Summary: 36 y.o. 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 MRI 8/3 extubated 8/5 reintubated 8/6 trach 8/7 doing well with PMSV. Sips and chips started.  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 UDS negative alcohol negative. MRI show evidence of anoxic brain injury. Patient is status post trach on 11/22/2021. Currently on clonazepam gabapentin oxycodone, she did need Precedex intermittently which is now has been weaned off. She is continue on  sertraline. Physical therapy evaluated the patient recommended inpatient rehab. Speech evaluated the patient recommended patient to be n.p.o. as she is high risk for aspiration. Core track was placed about 5 days prior to this dictation. Had a discussion with the family about PEG tube placement this will have to be readdressed at [facility] facility.  Postarrest encephalopathy: Neurology was consulted recommended gabapentin and clonazepam. Currently on Vimpat for seizure precautions, if the patient becomes lethargic we can probably decrease her gabapentin as tolerated. Her home dose of Topamax was discontinued.   Electrolyte imbalance hypokalemia/hypomagnesemia: With concerns of prolonged QTc.  Keep the potassium greater than 4 magnesium greater than 2.  Continue supplementation per core track.  Seizure disorder: No evidence of seizure in house continue Vimpat. Topamax was discontinued.  Acute respiratory failure with hypoxia due to V-fib VT arrest: Intubated on admission, trach placed on 11/22/2021. Continue inhalers.  Right lower extremity cellulitis/aspiration pneumonia: She completed her antibiotic course in-house.  Chronic pain syndrome: Continue oxycodone.  Essential hypertension: Currently on no antihypertensive medication.  ATN: Avoid nephrotoxic drugs likely due to prerenal azotemia in the setting of cardiac arrest she was fluid resuscitated her creatinine returned to baseline.  Nutrition: Started on TPN on 11/24/2021. Core track placed on 11/21/2021 advance postpyloric due to her history of chronic pancreatitis. She is currently tolerating trickle feeds at 20 cc an hour continue to advance as tolerated.  Shock liver: Now resolved.  Discharge Instructions  Discharge Instructions     Diet - low sodium heart healthy   Complete by: As directed    Discharge wound care:   Complete by: As directed    As per wound care instructions   Increase activity slowly   Complete  ECHOCARDIOGRAM REPORT   Patient Name:   ALLYIAH GARTNER Date of Exam: 11/14/2021 Medical Rec #:  [id number]     Height:       67.0 in Accession #:    [id number]    Weight:       200.4 lb Date of Birth:  November 18, 1985      BSA:          2.024 m Patient Age:    59 years      BP:           101/78 mmHg Patient Gender: F             HR:           98 bpm. Exam Location:  Inpatient Procedure: 2D Echo Indications:    cardiac arrest  History:        Patient has no prior history of Echocardiogram examinations.  Sonographer:    Johny Chess RDCS Referring Phys: [phone] [place]  1. Left ventricular ejection fraction, by estimation, is 45 to 50%. The left ventricle has mildly decreased function. The left ventricle demonstrates global hypokinesis. Indeterminate diastolic filling due to E-A fusion. Tissue Doppler velocities suggest normal myocardial relaxation.  2. Right ventricular systolic function is normal. The right ventricular size is normal. There is normal pulmonary artery systolic  pressure. The estimated right ventricular systolic pressure is 48.0 mmHg.  3. The mitral valve is normal in structure. No evidence of mitral valve regurgitation. No evidence of mitral stenosis.  4. The aortic valve is normal in structure. Aortic valve regurgitation is not visualized. No aortic stenosis is present.  5. The inferior vena cava is normal in size with greater than 50% respiratory variability, suggesting right atrial pressure of 3 mmHg. FINDINGS  Left Ventricle: Left ventricular ejection fraction, by estimation, is 45 to 50%. The left ventricle has mildly decreased function. The left ventricle demonstrates global hypokinesis. The left ventricular internal cavity size was normal in size. There is  no left ventricular hypertrophy. Indeterminate diastolic filling due to E-A fusion. Right Ventricle: The right ventricular size is normal. No increase in right ventricular wall thickness. Right ventricular systolic function is normal. There is normal pulmonary artery systolic pressure. The tricuspid regurgitant velocity is 2.02 m/s, and  with an assumed right atrial pressure of 3 mmHg, the estimated right ventricular systolic pressure is 16.5 mmHg. Left Atrium: Left atrial size was normal in size. Right Atrium: Right atrial size was normal in size. Pericardium: There is no evidence of pericardial effusion. Mitral Valve: The mitral valve is normal in structure. No evidence of mitral valve regurgitation. No evidence of mitral valve stenosis. Tricuspid Valve: The tricuspid valve is normal in structure. Tricuspid valve regurgitation is trivial. No evidence of tricuspid stenosis. Aortic Valve: Normal right and left coronary ostia location. The aortic valve is normal in structure. Aortic valve regurgitation is not visualized. No aortic stenosis is present. Pulmonic Valve: The pulmonic valve was grossly normal. Pulmonic valve regurgitation is not visualized. No evidence of pulmonic stenosis. Aorta: The aortic root and  ascending aorta are structurally normal, with no evidence of dilitation. Venous: The inferior vena cava is normal in size with greater than 50% respiratory variability, suggesting right atrial pressure of 3 mmHg. IAS/Shunts: No atrial level shunt detected by color flow Doppler.  LEFT VENTRICLE PLAX 2D LVIDd:         4.90 cm   Diastology LVIDs:         3.60 cm  above the carina. Enteric tube extends below the diaphragm with side-port in the proximal stomach and tip beyond the inferior margin of the image. Cardiomegaly with vascular congestion. No focal consolidation, pleural effusion, pneumothorax. No acute osseous pathology. IMPRESSION: 1. Endotracheal tube with tip approximately 1 cm above the carina. 2. Cardiomegaly with vascular congestion. Electronically Signed   By: Anner Crete M.D.   On: 11/16/2021 00:27   US RENAL  Result Date: 11/15/2021 CLINICAL DATA:  Acute renal insufficiency EXAM: RENAL / URINARY TRACT ULTRASOUND COMPLETE COMPARISON:  None Available. FINDINGS: Right Kidney: Renal measurements: 12.4 x 5.0 x 5.4 cm = volume: 174 mL. Limited evaluation without obvious abnormality. Left Kidney: Renal measurements: 12.7 x 5.1 x 5.3 cm = volume: 177 mL. Limited evaluation without obvious abnormality. Bladder: Not visualized Other: Moderate ascites. IMPRESSION: The study is limited but the kidneys are grossly unremarkable. The bladder was not visualized. Moderate ascites. Electronically Signed   By: Dorise Bullion III M.D.   On: 11/15/2021 11:49   VAS [place] LOWER EXTREMITY VENOUS (DVT)  Result Date: 11/15/2021  Lower Venous DVT Study Patient Name:  Kara [name]  Date of Exam:   11/14/2021 Medical Rec #: [id number]      Accession #:     [id number] Date of Birth: 03-16-1986       Patient Gender: F Patient Age:   36 years Exam Location:  [facility] Procedure:      VAS [place] LOWER EXTREMITY VENOUS (DVT) Referring Phys: Jenny Reichmann PAYNE --------------------------------------------------------------------------------  Indications: Edema. Other Indications: Status post cardiac arrest. Comparison Study: No prior study on file Performing Technologist: Sharion Dove RVS  Examination Guidelines: A complete evaluation includes B-mode imaging, spectral Doppler, color Doppler, and power Doppler as needed of all accessible portions of each vessel. Bilateral testing is considered an integral part of a complete examination. Limited examinations for reoccurring indications may be performed as noted. The reflux portion of the exam is performed with the patient in reverse Trendelenburg.  +---------+---------------+---------+-----------+----------+--------------+ RIGHT    CompressibilityPhasicitySpontaneityPropertiesThrombus Aging +---------+---------------+---------+-----------+----------+--------------+ CFV      Full           Yes      Yes                                 +---------+---------------+---------+-----------+----------+--------------+ SFJ      Full                                                        +---------+---------------+---------+-----------+----------+--------------+ FV Prox  Full                                                        +---------+---------------+---------+-----------+----------+--------------+ FV Mid   Full                                                        +---------+---------------+---------+-----------+----------+--------------+ FV DistalFull                                                        +---------+---------------+---------+-----------+----------+--------------+  above the carina. Enteric tube extends below the diaphragm with side-port in the proximal stomach and tip beyond the inferior margin of the image. Cardiomegaly with vascular congestion. No focal consolidation, pleural effusion, pneumothorax. No acute osseous pathology. IMPRESSION: 1. Endotracheal tube with tip approximately 1 cm above the carina. 2. Cardiomegaly with vascular congestion. Electronically Signed   By: Anner Crete M.D.   On: 11/16/2021 00:27   US RENAL  Result Date: 11/15/2021 CLINICAL DATA:  Acute renal insufficiency EXAM: RENAL / URINARY TRACT ULTRASOUND COMPLETE COMPARISON:  None Available. FINDINGS: Right Kidney: Renal measurements: 12.4 x 5.0 x 5.4 cm = volume: 174 mL. Limited evaluation without obvious abnormality. Left Kidney: Renal measurements: 12.7 x 5.1 x 5.3 cm = volume: 177 mL. Limited evaluation without obvious abnormality. Bladder: Not visualized Other: Moderate ascites. IMPRESSION: The study is limited but the kidneys are grossly unremarkable. The bladder was not visualized. Moderate ascites. Electronically Signed   By: Dorise Bullion III M.D.   On: 11/15/2021 11:49   VAS [place] LOWER EXTREMITY VENOUS (DVT)  Result Date: 11/15/2021  Lower Venous DVT Study Patient Name:  Kara [name]  Date of Exam:   11/14/2021 Medical Rec #: [id number]      Accession #:     [id number] Date of Birth: 03-16-1986       Patient Gender: F Patient Age:   36 years Exam Location:  [facility] Procedure:      VAS [place] LOWER EXTREMITY VENOUS (DVT) Referring Phys: Jenny Reichmann PAYNE --------------------------------------------------------------------------------  Indications: Edema. Other Indications: Status post cardiac arrest. Comparison Study: No prior study on file Performing Technologist: Sharion Dove RVS  Examination Guidelines: A complete evaluation includes B-mode imaging, spectral Doppler, color Doppler, and power Doppler as needed of all accessible portions of each vessel. Bilateral testing is considered an integral part of a complete examination. Limited examinations for reoccurring indications may be performed as noted. The reflux portion of the exam is performed with the patient in reverse Trendelenburg.  +---------+---------------+---------+-----------+----------+--------------+ RIGHT    CompressibilityPhasicitySpontaneityPropertiesThrombus Aging +---------+---------------+---------+-----------+----------+--------------+ CFV      Full           Yes      Yes                                 +---------+---------------+---------+-----------+----------+--------------+ SFJ      Full                                                        +---------+---------------+---------+-----------+----------+--------------+ FV Prox  Full                                                        +---------+---------------+---------+-----------+----------+--------------+ FV Mid   Full                                                        +---------+---------------+---------+-----------+----------+--------------+ FV DistalFull                                                        +---------+---------------+---------+-----------+----------+--------------+  above the carina. Enteric tube extends below the diaphragm with side-port in the proximal stomach and tip beyond the inferior margin of the image. Cardiomegaly with vascular congestion. No focal consolidation, pleural effusion, pneumothorax. No acute osseous pathology. IMPRESSION: 1. Endotracheal tube with tip approximately 1 cm above the carina. 2. Cardiomegaly with vascular congestion. Electronically Signed   By: Anner Crete M.D.   On: 11/16/2021 00:27   US RENAL  Result Date: 11/15/2021 CLINICAL DATA:  Acute renal insufficiency EXAM: RENAL / URINARY TRACT ULTRASOUND COMPLETE COMPARISON:  None Available. FINDINGS: Right Kidney: Renal measurements: 12.4 x 5.0 x 5.4 cm = volume: 174 mL. Limited evaluation without obvious abnormality. Left Kidney: Renal measurements: 12.7 x 5.1 x 5.3 cm = volume: 177 mL. Limited evaluation without obvious abnormality. Bladder: Not visualized Other: Moderate ascites. IMPRESSION: The study is limited but the kidneys are grossly unremarkable. The bladder was not visualized. Moderate ascites. Electronically Signed   By: Dorise Bullion III M.D.   On: 11/15/2021 11:49   VAS [place] LOWER EXTREMITY VENOUS (DVT)  Result Date: 11/15/2021  Lower Venous DVT Study Patient Name:  Kara [name]  Date of Exam:   11/14/2021 Medical Rec #: [id number]      Accession #:     [id number] Date of Birth: 03-16-1986       Patient Gender: F Patient Age:   36 years Exam Location:  [facility] Procedure:      VAS [place] LOWER EXTREMITY VENOUS (DVT) Referring Phys: Jenny Reichmann PAYNE --------------------------------------------------------------------------------  Indications: Edema. Other Indications: Status post cardiac arrest. Comparison Study: No prior study on file Performing Technologist: Sharion Dove RVS  Examination Guidelines: A complete evaluation includes B-mode imaging, spectral Doppler, color Doppler, and power Doppler as needed of all accessible portions of each vessel. Bilateral testing is considered an integral part of a complete examination. Limited examinations for reoccurring indications may be performed as noted. The reflux portion of the exam is performed with the patient in reverse Trendelenburg.  +---------+---------------+---------+-----------+----------+--------------+ RIGHT    CompressibilityPhasicitySpontaneityPropertiesThrombus Aging +---------+---------------+---------+-----------+----------+--------------+ CFV      Full           Yes      Yes                                 +---------+---------------+---------+-----------+----------+--------------+ SFJ      Full                                                        +---------+---------------+---------+-----------+----------+--------------+ FV Prox  Full                                                        +---------+---------------+---------+-----------+----------+--------------+ FV Mid   Full                                                        +---------+---------------+---------+-----------+----------+--------------+ FV DistalFull                                                        +---------+---------------+---------+-----------+----------+--------------+  Physician Discharge Summary  Kara [name] FGH:[id number] DOB: 1985/07/15 DOA: 11/13/2021  PCP: Lawerance Cruel, MD  Admit date: 11/13/2021 Discharge date: 11/26/2021  Admitted From: Home Disposition:  LTACH  Recommendations for Outpatient Follow-up:  Will need to be further evaluated for swallowing a needed PEG tube in the future.  Home Health:No Equipment/Devices:none  Discharge Condition:Stable CODE STATUS:Full Diet recommendation: Heart Healthy  Brief/Interim Summary: 36 y.o. 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 MRI 8/3 extubated 8/5 reintubated 8/6 trach 8/7 doing well with PMSV. Sips and chips started.  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 UDS negative alcohol negative. MRI show evidence of anoxic brain injury. Patient is status post trach on 11/22/2021. Currently on clonazepam gabapentin oxycodone, she did need Precedex intermittently which is now has been weaned off. She is continue on  sertraline. Physical therapy evaluated the patient recommended inpatient rehab. Speech evaluated the patient recommended patient to be n.p.o. as she is high risk for aspiration. Core track was placed about 5 days prior to this dictation. Had a discussion with the family about PEG tube placement this will have to be readdressed at [facility] facility.  Postarrest encephalopathy: Neurology was consulted recommended gabapentin and clonazepam. Currently on Vimpat for seizure precautions, if the patient becomes lethargic we can probably decrease her gabapentin as tolerated. Her home dose of Topamax was discontinued.   Electrolyte imbalance hypokalemia/hypomagnesemia: With concerns of prolonged QTc.  Keep the potassium greater than 4 magnesium greater than 2.  Continue supplementation per core track.  Seizure disorder: No evidence of seizure in house continue Vimpat. Topamax was discontinued.  Acute respiratory failure with hypoxia due to V-fib VT arrest: Intubated on admission, trach placed on 11/22/2021. Continue inhalers.  Right lower extremity cellulitis/aspiration pneumonia: She completed her antibiotic course in-house.  Chronic pain syndrome: Continue oxycodone.  Essential hypertension: Currently on no antihypertensive medication.  ATN: Avoid nephrotoxic drugs likely due to prerenal azotemia in the setting of cardiac arrest she was fluid resuscitated her creatinine returned to baseline.  Nutrition: Started on TPN on 11/24/2021. Core track placed on 11/21/2021 advance postpyloric due to her history of chronic pancreatitis. She is currently tolerating trickle feeds at 20 cc an hour continue to advance as tolerated.  Shock liver: Now resolved.  Discharge Instructions  Discharge Instructions     Diet - low sodium heart healthy   Complete by: As directed    Discharge wound care:   Complete by: As directed    As per wound care instructions   Increase activity slowly   Complete  Physician Discharge Summary  Kara [name] FGH:[id number] DOB: 1985/07/15 DOA: 11/13/2021  PCP: Lawerance Cruel, MD  Admit date: 11/13/2021 Discharge date: 11/26/2021  Admitted From: Home Disposition:  LTACH  Recommendations for Outpatient Follow-up:  Will need to be further evaluated for swallowing a needed PEG tube in the future.  Home Health:No Equipment/Devices:none  Discharge Condition:Stable CODE STATUS:Full Diet recommendation: Heart Healthy  Brief/Interim Summary: 36 y.o. 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 MRI 8/3 extubated 8/5 reintubated 8/6 trach 8/7 doing well with PMSV. Sips and chips started.  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 UDS negative alcohol negative. MRI show evidence of anoxic brain injury. Patient is status post trach on 11/22/2021. Currently on clonazepam gabapentin oxycodone, she did need Precedex intermittently which is now has been weaned off. She is continue on  sertraline. Physical therapy evaluated the patient recommended inpatient rehab. Speech evaluated the patient recommended patient to be n.p.o. as she is high risk for aspiration. Core track was placed about 5 days prior to this dictation. Had a discussion with the family about PEG tube placement this will have to be readdressed at [facility] facility.  Postarrest encephalopathy: Neurology was consulted recommended gabapentin and clonazepam. Currently on Vimpat for seizure precautions, if the patient becomes lethargic we can probably decrease her gabapentin as tolerated. Her home dose of Topamax was discontinued.   Electrolyte imbalance hypokalemia/hypomagnesemia: With concerns of prolonged QTc.  Keep the potassium greater than 4 magnesium greater than 2.  Continue supplementation per core track.  Seizure disorder: No evidence of seizure in house continue Vimpat. Topamax was discontinued.  Acute respiratory failure with hypoxia due to V-fib VT arrest: Intubated on admission, trach placed on 11/22/2021. Continue inhalers.  Right lower extremity cellulitis/aspiration pneumonia: She completed her antibiotic course in-house.  Chronic pain syndrome: Continue oxycodone.  Essential hypertension: Currently on no antihypertensive medication.  ATN: Avoid nephrotoxic drugs likely due to prerenal azotemia in the setting of cardiac arrest she was fluid resuscitated her creatinine returned to baseline.  Nutrition: Started on TPN on 11/24/2021. Core track placed on 11/21/2021 advance postpyloric due to her history of chronic pancreatitis. She is currently tolerating trickle feeds at 20 cc an hour continue to advance as tolerated.  Shock liver: Now resolved.  Discharge Instructions  Discharge Instructions     Diet - low sodium heart healthy   Complete by: As directed    Discharge wound care:   Complete by: As directed    As per wound care instructions   Increase activity slowly   Complete  above the carina. Enteric tube extends below the diaphragm with side-port in the proximal stomach and tip beyond the inferior margin of the image. Cardiomegaly with vascular congestion. No focal consolidation, pleural effusion, pneumothorax. No acute osseous pathology. IMPRESSION: 1. Endotracheal tube with tip approximately 1 cm above the carina. 2. Cardiomegaly with vascular congestion. Electronically Signed   By: Anner Crete M.D.   On: 11/16/2021 00:27   US RENAL  Result Date: 11/15/2021 CLINICAL DATA:  Acute renal insufficiency EXAM: RENAL / URINARY TRACT ULTRASOUND COMPLETE COMPARISON:  None Available. FINDINGS: Right Kidney: Renal measurements: 12.4 x 5.0 x 5.4 cm = volume: 174 mL. Limited evaluation without obvious abnormality. Left Kidney: Renal measurements: 12.7 x 5.1 x 5.3 cm = volume: 177 mL. Limited evaluation without obvious abnormality. Bladder: Not visualized Other: Moderate ascites. IMPRESSION: The study is limited but the kidneys are grossly unremarkable. The bladder was not visualized. Moderate ascites. Electronically Signed   By: Dorise Bullion III M.D.   On: 11/15/2021 11:49   VAS [place] LOWER EXTREMITY VENOUS (DVT)  Result Date: 11/15/2021  Lower Venous DVT Study Patient Name:  Kara [name]  Date of Exam:   11/14/2021 Medical Rec #: [id number]      Accession #:     [id number] Date of Birth: 03-16-1986       Patient Gender: F Patient Age:   36 years Exam Location:  [facility] Procedure:      VAS [place] LOWER EXTREMITY VENOUS (DVT) Referring Phys: Jenny Reichmann PAYNE --------------------------------------------------------------------------------  Indications: Edema. Other Indications: Status post cardiac arrest. Comparison Study: No prior study on file Performing Technologist: Sharion Dove RVS  Examination Guidelines: A complete evaluation includes B-mode imaging, spectral Doppler, color Doppler, and power Doppler as needed of all accessible portions of each vessel. Bilateral testing is considered an integral part of a complete examination. Limited examinations for reoccurring indications may be performed as noted. The reflux portion of the exam is performed with the patient in reverse Trendelenburg.  +---------+---------------+---------+-----------+----------+--------------+ RIGHT    CompressibilityPhasicitySpontaneityPropertiesThrombus Aging +---------+---------------+---------+-----------+----------+--------------+ CFV      Full           Yes      Yes                                 +---------+---------------+---------+-----------+----------+--------------+ SFJ      Full                                                        +---------+---------------+---------+-----------+----------+--------------+ FV Prox  Full                                                        +---------+---------------+---------+-----------+----------+--------------+ FV Mid   Full                                                        +---------+---------------+---------+-----------+----------+--------------+ FV DistalFull                                                        +---------+---------------+---------+-----------+----------+--------------+  above the carina. Enteric tube extends below the diaphragm with side-port in the proximal stomach and tip beyond the inferior margin of the image. Cardiomegaly with vascular congestion. No focal consolidation, pleural effusion, pneumothorax. No acute osseous pathology. IMPRESSION: 1. Endotracheal tube with tip approximately 1 cm above the carina. 2. Cardiomegaly with vascular congestion. Electronically Signed   By: Anner Crete M.D.   On: 11/16/2021 00:27   US RENAL  Result Date: 11/15/2021 CLINICAL DATA:  Acute renal insufficiency EXAM: RENAL / URINARY TRACT ULTRASOUND COMPLETE COMPARISON:  None Available. FINDINGS: Right Kidney: Renal measurements: 12.4 x 5.0 x 5.4 cm = volume: 174 mL. Limited evaluation without obvious abnormality. Left Kidney: Renal measurements: 12.7 x 5.1 x 5.3 cm = volume: 177 mL. Limited evaluation without obvious abnormality. Bladder: Not visualized Other: Moderate ascites. IMPRESSION: The study is limited but the kidneys are grossly unremarkable. The bladder was not visualized. Moderate ascites. Electronically Signed   By: Dorise Bullion III M.D.   On: 11/15/2021 11:49   VAS [place] LOWER EXTREMITY VENOUS (DVT)  Result Date: 11/15/2021  Lower Venous DVT Study Patient Name:  Kara [name]  Date of Exam:   11/14/2021 Medical Rec #: [id number]      Accession #:     [id number] Date of Birth: 03-16-1986       Patient Gender: F Patient Age:   36 years Exam Location:  [facility] Procedure:      VAS [place] LOWER EXTREMITY VENOUS (DVT) Referring Phys: Jenny Reichmann PAYNE --------------------------------------------------------------------------------  Indications: Edema. Other Indications: Status post cardiac arrest. Comparison Study: No prior study on file Performing Technologist: Sharion Dove RVS  Examination Guidelines: A complete evaluation includes B-mode imaging, spectral Doppler, color Doppler, and power Doppler as needed of all accessible portions of each vessel. Bilateral testing is considered an integral part of a complete examination. Limited examinations for reoccurring indications may be performed as noted. The reflux portion of the exam is performed with the patient in reverse Trendelenburg.  +---------+---------------+---------+-----------+----------+--------------+ RIGHT    CompressibilityPhasicitySpontaneityPropertiesThrombus Aging +---------+---------------+---------+-----------+----------+--------------+ CFV      Full           Yes      Yes                                 +---------+---------------+---------+-----------+----------+--------------+ SFJ      Full                                                        +---------+---------------+---------+-----------+----------+--------------+ FV Prox  Full                                                        +---------+---------------+---------+-----------+----------+--------------+ FV Mid   Full                                                        +---------+---------------+---------+-----------+----------+--------------+ FV DistalFull                                                        +---------+---------------+---------+-----------+----------+--------------+

## 2021-11-26 NOTE — Progress Notes (Addendum)
Pt very agitated pulling at lines and coretrak. Verbal for soft restraints for bilateral wrists. Order placed and restraints applied.

## 2021-11-26 NOTE — Progress Notes (Signed)
[facility] PROGRESS NOTE    Progress Note  Kara [name]  ZHY:[id number] DOB: [birth year] DOA: 11/13/2021 PCP: Kara Floro, MD     Brief Narrative:   Kara [name] is an 36 y.o. 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 MRI 8/3 extubated 8/5 reintubated 8/6 trach 8/7 doing well with PMSV. Sips and chips started.  8/9 tolerated trach collar for two days, mental status improved throughout the day and asking about dogs and work per mom   Assessment/Plan:   VT/VT arrest suspect prolonged QTc syndrome: Patient was found unresponsive about 20 minutes about 15 minutes to ROSC. UDS negative alcohol negative. MRI with some evidence of anoxic brain injury. Status post trach on 11/22/2021. On clonazepam, gabapentin and oxycodone now off Precedex. Also on sertraline, monitor QTc. Hopefully can qualify for CIR Speech evaluated the patient recommended NPO. Core track placed about 5 days ago I discussed with family PEG tube placement.  Postarrest encephalopathy: Neurology was consulted recommended and gabapentin, clonazepam. Using Vimpat for seizure precaution, they recommend to decrease gabapentin if patient remains lethargic. Stop Topamax and further PT OT evaluation. MRI does show some evidence of anoxic injury.  Electrolyte imbalance hypokalemia hypomagnesemia: With concern of prolonged QTc, check the potassium greater than 4 magnesium greater than 2. Give potassium per  tube  Seizure disorder: Continue Vimpat no evidence of seizure. Continue to hold Topamax.  Acute respiratory failure with hypoxia due to VT/VT arrest: Intubated on admission trach placed on 11/22/2021. Continue Breo albuterol singular Continue Pulmicort  Brovana as needed.  Right lower extremity cellulitis/aspiration pneumonia: Complete antibiotic course in-house.  Chronic pain: Continue oxycodone.  Essential hypertension: Continue to hold antihypertensive medication.  ATN: Avoid nephrotoxic drugs likely due to prerenal azotemia. She has been fluid resuscitated her creatinine is returning to baseline.  Nutrition Started on TPN on 11/24/2021. Core track advance postpyloric currently on trickle feeds.  Shock liver: Now resolved.   DVT prophylaxis: lovenox Family Communication:none Status is: Inpatient Remains inpatient appropriate because: Prolonged hospital stay due to VT arrest and postarrest encephalopathy    Code Status:     Code Status Orders  (From admission, onward)           Start     Ordered   11/13/21 2106  Full code  Continuous        11/13/21 2107           Code Status History     This patient has a current code status but no historical code status.         IV Access:   Peripheral IV   Procedures and diagnostic studies:   DG Abd Portable 1V  Result Date: 11/25/2021 CLINICAL DATA:  Feeding tube placement EXAM: PORTABLE ABDOMEN - 1 VIEW COMPARISON:  11/20/2021 FINDINGS: Tip of feeding tube is seen in the proximal course of the jejunum. Surgical clips are seen in right upper quadrant. Bowel gas pattern is unremarkable. IMPRESSION: Tip of feeding tube is seen in the course of proximal jejunum. Electronically Signed  By: Ernie Avena M.D.   On: 11/25/2021 12:40   DG Chest Port 1 View  Result Date: 11/25/2021 CLINICAL DATA:  Respiratory failure. EXAM: PORTABLE CHEST 1 VIEW COMPARISON:  11/22/2021 FINDINGS: The tracheostomy tube,  right PICC line and feeding tube are in good position, unchanged. Persistent and slight worsening diffuse interstitial and airspace process. No pleural effusions or pneumothorax. IMPRESSION: Stable support apparatus. Persistent and slight worsening diffuse interstitial and airspace process. Electronically Signed   By: Rudie Meyer M.D.   On: 11/25/2021 08:21     Medical Consultants:   None.   Subjective:    Kara [name] patient relates she is in no pain  Objective:    Vitals:   11/25/21 2317 11/25/21 2321 11/26/21 0318 11/26/21 0352  BP: 133/88   (!) 132/100  Pulse: 86 86 80 90  Resp: 20 20 20 20   Temp: 98.8 F (37.1 C)   99.1 F (37.3 C)  TempSrc:    Axillary  SpO2:  100% 100% 100%  Weight:      Height:       SpO2: 100 % O2 Flow Rate (L/min): 5 L/min FiO2 (%): 28 %   Intake/Output Summary (Last 24 hours) at 11/26/2021 0712 Last data filed at 11/26/2021 0355 Gross per 24 hour  Intake 1258.34 ml  Output 3590 ml  Net -2331.66 ml   Filed Weights   11/21/21 0500 11/22/21 0400 11/23/21 0437  Weight: 94.6 kg 101.4 kg 94.1 kg    Exam: General exam: In no acute distress. Respiratory system: Good air movement and clear to auscultation. Cardiovascular system: S1 & S2 heard, RRR. No JVD. Gastrointestinal system: Abdomen is nondistended, soft and nontender.  Extremities: No pedal edema. Skin: No rashes, lesions or ulcers Psychiatry: Judgement and insight appear normal. Mood & affect appropriate.    Data Reviewed:    Labs: Basic Metabolic Panel: Recent Labs  Lab 11/20/21 0634 11/21/21 0634 11/22/21 1627 11/23/21 0311 11/24/21 0415 11/24/21 1653 11/25/21 0503 11/26/21 0448  NA 139   < >  --  145 150* 149* 149* 145  K 3.6   < > 3.5 3.6 3.1* 3.6 3.6 3.9  CL 108   < >  --  117* 119* 118* 120* 114*  CO2 11*   < >  --  22 22 24 24 25   GLUCOSE 124*   < >  --  123* 150* 170* 183* 219*  BUN 47*   < >  --  36* 31* 25* 22* 18  CREATININE 3.48*   < >  --  2.22*  1.66* 1.48* 1.31* 1.01*  CALCIUM 9.5   < >  --  8.3* 8.2* 8.5* 8.3* 8.6*  MG 2.4   < > 2.5* 2.4 1.6*  --  1.6* 2.1  PHOS 4.4  --   --  2.7 2.8  --  2.0* 2.5   < > = values in this interval not displayed.   GFR Estimated Creatinine Clearance: 89.8 mL/min (A) (by C-G formula based on SCr of 1.01 mg/dL (H)). Liver Function Tests: Recent Labs  Lab 11/20/21 0634 11/23/21 0311 11/26/21 0448  AST 91* 47* 41  ALT 38 30 23  ALKPHOS 397* 322* 303*  BILITOT 3.1* 1.8* 1.2  PROT 6.5 5.7* 6.7  ALBUMIN 2.4* 2.0* 2.3*   Recent Labs  Lab 11/25/21 0503  LIPASE 39   No results for input(s): "AMMONIA" in the last 168 hours. Coagulation profile No results for input(s): "INR", "PROTIME" in the last 168  hours. COVID-19 Labs  No results for input(s): "DDIMER", "FERRITIN", "LDH", "CRP" in the last 72 hours.  No results found for: "SARSCOV2NAA"  CBC: Recent Labs  Lab 11/21/21 0634 11/21/21 2136 11/22/21 0356 11/22/21 0617 11/23/21 0311 11/24/21 0415 11/25/21 0503  WBC 8.3  --   --  9.3 8.4 8.7 11.2*  HGB 9.2*   < > 9.5* 9.3* 9.1* 9.1* 8.9*  HCT 28.6*   < > 28.0* 29.6* 28.1* 28.6* 29.0*  MCV 101.1*  --   --  101.4* 99.3 101.1* 103.9*  PLT 160  --   --  192 216 221 221   < > = values in this interval not displayed.   Cardiac Enzymes: No results for input(s): "CKTOTAL", "CKMB", "CKMBINDEX", "TROPONINI" in the last 168 hours. BNP (last 3 results) No results for input(s): "PROBNP" in the last 8760 hours. CBG: Recent Labs  Lab 11/25/21 1158 11/25/21 1636 11/25/21 2108 11/26/21 0018 11/26/21 0357  GLUCAP 124* 113* 161* 159* 203*   D-Dimer: No results for input(s): "DDIMER" in the last 72 hours. Hgb A1c: No results for input(s): "HGBA1C" in the last 72 hours. Lipid Profile: Recent Labs    11/25/21 0504 11/26/21 0448  TRIG 207* 200*   Thyroid function studies: No results for input(s): "TSH", "T4TOTAL", "T3FREE", "THYROIDAB" in the last 72 hours.  Invalid input(s):  "FREET3" Anemia work up: No results for input(s): "VITAMINB12", "FOLATE", "FERRITIN", "TIBC", "IRON", "RETICCTPCT" in the last 72 hours. Sepsis Labs: Recent Labs  Lab 11/22/21 0617 11/23/21 0311 11/24/21 0415 11/25/21 0503  WBC 9.3 8.4 8.7 11.2*   Microbiology Recent Results (from the past 240 hour(s))  Culture, Respiratory w Gram Stain     Status: None   Collection Time: 11/16/21 11:11 AM   Specimen: Tracheal Aspirate; Respiratory  Result Value Ref Range Status   Specimen Description TRACHEAL ASPIRATE  Final   Special Requests NONE  Final   Gram Stain   Final    ABUNDANT WBC PRESENT, PREDOMINANTLY PMN RARE GRAM NEGATIVE RODS    Culture   Final    RARE Normal respiratory flora-no Staph aureus or Pseudomonas seen Performed at [facility], [address], [place], [place] [address]    Report Status 11/18/2021 FINAL  Final  Culture, blood (Routine X 2) w Reflex to ID Panel     Status: None (Preliminary result)   Collection Time: 11/24/21  2:29 AM   Specimen: BLOOD LEFT HAND  Result Value Ref Range Status   Specimen Description BLOOD LEFT HAND  Final   Special Requests AEROBIC BOTTLE ONLY Blood Culture adequate volume  Final   Culture   Final    NO GROWTH 1 DAY Performed at [facility], [address], [place], [place] [address]    Report Status PENDING  Incomplete  Culture, blood (Routine X 2) w Reflex to ID Panel     Status: None (Preliminary result)   Collection Time: 11/24/21  2:37 AM   Specimen: BLOOD LEFT HAND  Result Value Ref Range Status   Specimen Description BLOOD LEFT HAND  Final   Special Requests AEROBIC BOTTLE ONLY Blood Culture adequate volume  Final   Culture   Final    NO GROWTH 1 DAY Performed at [facility], [address] [place], [place] [address]    Report Status PENDING  Incomplete     Medications:    arformoterol  15 mcg Nebulization BID   budesonide (PULMICORT) nebulizer solution  0.25 mg  Nebulization BID    Chlorhexidine Gluconate Cloth  6 each Topical Daily   clonazePAM  2 mg Per Tube BID   feeding supplement (VITAL AF 1.2 CAL)  1,000 mL Per Tube Q24H   folic acid  1 mg Per Tube Daily   gabapentin  300 mg Per Tube Q8H   heparin injection (subcutaneous)  5,000 Units Subcutaneous Q8H   insulin aspart  0-9 Units Subcutaneous Q4H   melatonin  3 mg Per Tube QHS   mouth rinse  15 mL Mouth Rinse Q2H   oxyCODONE  10 mg Per Tube Q6H   pantoprazole sodium  40 mg Per Tube Daily   sertraline  50 mg Per Tube Daily   sodium bicarbonate  1,300 mg Per Tube TID   sodium chloride flush  10-40 mL Intracatheter Q12H   thiamine (VITAMIN B1) injection  100 mg Intravenous Daily   Continuous Infusions:  lacosamide (VIMPAT) IV 100 mg (11/25/21 2223)   TPN ADULT (ION) 80 mL/hr at 11/25/21 1739      LOS: 13 days   Marinda Elk  [facility]  11/26/2021, 7:12 AM

## 2021-11-26 NOTE — Consult Note (Signed)
ELECTROPHYSIOLOGY CONSULT NOTE    Patient ID: Kara [name] MRN: [id number], DOB/AGE: 06-04-1985 36 y.o.  Admit date: 11/13/2021 Date of Consult: 11/26/2021  Primary Physician: Lawerance Cruel, MD Primary Cardiologist: None  Electrophysiologist:  New  Referring Provider: Dr. Aileen Fass  Patient Profile: Kara [name] is a 36 y.o. 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 Prolonged QT at the request of Dr. 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  Both of her parents have history of heart failure.  Last echocardiogram obtained on 06/13/2016 demonstrated EF 60 to 65%, normal wall motion, trivial TR.    Patient was recently admitted was right lower extremity cellulitis on 11/04/2021 and was discharged on 11/07/2021.  Limited lower extremity venous Doppler was negative for DVT.  Hospital course was complicated by hypokalemia and hypomagnesemia.  It was felt she was not septic.  She was eventually discharged on cefadroxil and doxycycline for 7 days.  At the patient's request, she was given a limited prescription of Dilaudid for 2 days.    Since discharge, patient reported recovering well at home.  Unfortunately for the 2 days leading up to her admission, husband says patient was having significant nausea and vomiting and could not keep any medication or food down. The day of admission, patient's husband went out to walk the dog, prior to leaving home, patient was in  normal condition.  Upon returning, he went straight to the bathroom to take a shower.  When he came out of the shower around 5:35 PM, he saw the patient was in agonal breathing and immediately called 911.  Fire department arrived first and noted patient was in V. tach and started CPR and shocked the patient twice.  EMS followed soon after and shocked the patient 2 additional times.  During the CPR, patient received a total of 6 rounds of epi and at least round round of amiodarone.  She was intubated and sedated and currently on epi drip.  EKG on arrival sinus rhythm without prolonged QTc. Initial blood work significant for potassium was 2.5.  Decreased significantly from the previous potassium of 4.1 on 11/07/2021.  She had a significantly complicated hospital admission.  Significant events include:  8/2 EEG stopped. MRI 8/3 extubated 8/5 reintubated 8/6 trach 8/7 doing well with PMSV. Sips and chips started.  8/9 tolerated trach collar for two days, mental status improved throughout the day and asking about dogs and work per mom 8/10 Increased agitation overnight.  Approved for LTAC transfer  Cardiology had been following along and pt remained stable without further arrhyhtmia. They followed at a distance pending her recovery.   EKG today shows QTc of ~ 500 ms.  Potassium3.9 (08/10 0448) Magnesium  2.1 (08/10 0448) Creatinine, ser  1.01* (08/10 0448) PLT  221 (08/09 0503) HGB  8.9* (08/09 0503) WBC 11.2* (08/09 0503)  EP asked to weigh in prior to discharge.   ROS: Patient is encephalopathic and/or intubated. History has been obtained from chart review.   Note, patient has a separate epic account,  1 VIEW COMPARISON:  November 16, 2021 FINDINGS: Endotracheal tube has been retracted approximately 4.9 cm now above the carina, tip between clavicular heads. EKG leads project over the chest. Gastric tube courses through in off the field of the radiograph into the stomach, incompletely evaluated. Cardiomediastinal contours and hilar structures are stable. No lobar consolidation.  Subtle LEFT basilar airspace disease. No visible pneumothorax. No acute skeletal process on limited assessment. IMPRESSION: 1. Endotracheal tube has been retracted approximately 4.9 cm above the carina, tip between the clavicular heads. 2. Subtle LEFT basilar airspace disease, increased from the prior study in this patient with reported possible aspiration on previous CT. Electronically Signed   By: Donzetta Kohut M.D.   On: 11/16/2021 09:42   DG CHEST PORT 1 VIEW  Result Date: 11/16/2021 CLINICAL DATA:  Intubation. EXAM: PORTABLE CHEST 1 VIEW COMPARISON:  Chest radiograph dated 11/14/2021. FINDINGS: Endotracheal tube with tip approximately 1 cm above the carina. Enteric tube extends below the diaphragm with side-port in the proximal stomach and tip beyond the inferior margin of the image. Cardiomegaly with vascular congestion. No focal consolidation, pleural effusion, pneumothorax. No acute osseous pathology. IMPRESSION: 1. Endotracheal tube with tip approximately 1 cm above the carina. 2. Cardiomegaly with vascular congestion. Electronically Signed   By: Elgie Collard M.D.   On: 11/16/2021 00:27   US RENAL  Result Date: 11/15/2021 CLINICAL DATA:  Acute renal insufficiency EXAM: RENAL / URINARY TRACT ULTRASOUND COMPLETE COMPARISON:  None Available. FINDINGS: Right Kidney: Renal measurements: 12.4 x 5.0 x 5.4 cm = volume: 174 mL. Limited evaluation without obvious abnormality. Left Kidney: Renal measurements: 12.7 x 5.1 x 5.3 cm = volume: 177 mL. Limited evaluation without obvious abnormality. Bladder: Not visualized Other: Moderate ascites. IMPRESSION: The study is limited but the kidneys are grossly unremarkable. The bladder was not visualized. Moderate ascites. Electronically Signed   By: Gerome Sam III M.D.   On: 11/15/2021 11:49   VAS [place] LOWER EXTREMITY VENOUS (DVT)  Result Date: 11/15/2021  Lower Venous DVT Study Patient Name:  Kara [name]  Date of Exam:   11/14/2021 Medical Rec #:  [id number]      Accession #:    [id number] Date of Birth: 1986/01/06       Patient Gender: F Patient Age:   89 years Exam Location:  [facility] Procedure:      VAS [place] LOWER EXTREMITY VENOUS (DVT) Referring Phys: Jonny Ruiz PAYNE --------------------------------------------------------------------------------  Indications: Edema. Other Indications: Status post cardiac arrest. Comparison Study: No prior study on file Performing Technologist: Sherren Kerns RVS  Examination Guidelines: A complete evaluation includes B-mode imaging, spectral Doppler, color Doppler, and power Doppler as needed of all accessible portions of each vessel. Bilateral testing is considered an integral part of a complete examination. Limited examinations for reoccurring indications may be performed as noted. The reflux portion of the exam is performed with the patient in reverse Trendelenburg.  +---------+---------------+---------+-----------+----------+--------------+ RIGHT    CompressibilityPhasicitySpontaneityPropertiesThrombus Aging +---------+---------------+---------+-----------+----------+--------------+ CFV      Full           Yes      Yes                                 +---------+---------------+---------+-----------+----------+--------------+ SFJ      Full                                                        +---------+---------------+---------+-----------+----------+--------------+  1 VIEW COMPARISON:  November 16, 2021 FINDINGS: Endotracheal tube has been retracted approximately 4.9 cm now above the carina, tip between clavicular heads. EKG leads project over the chest. Gastric tube courses through in off the field of the radiograph into the stomach, incompletely evaluated. Cardiomediastinal contours and hilar structures are stable. No lobar consolidation.  Subtle LEFT basilar airspace disease. No visible pneumothorax. No acute skeletal process on limited assessment. IMPRESSION: 1. Endotracheal tube has been retracted approximately 4.9 cm above the carina, tip between the clavicular heads. 2. Subtle LEFT basilar airspace disease, increased from the prior study in this patient with reported possible aspiration on previous CT. Electronically Signed   By: Donzetta Kohut M.D.   On: 11/16/2021 09:42   DG CHEST PORT 1 VIEW  Result Date: 11/16/2021 CLINICAL DATA:  Intubation. EXAM: PORTABLE CHEST 1 VIEW COMPARISON:  Chest radiograph dated 11/14/2021. FINDINGS: Endotracheal tube with tip approximately 1 cm above the carina. Enteric tube extends below the diaphragm with side-port in the proximal stomach and tip beyond the inferior margin of the image. Cardiomegaly with vascular congestion. No focal consolidation, pleural effusion, pneumothorax. No acute osseous pathology. IMPRESSION: 1. Endotracheal tube with tip approximately 1 cm above the carina. 2. Cardiomegaly with vascular congestion. Electronically Signed   By: Elgie Collard M.D.   On: 11/16/2021 00:27   US RENAL  Result Date: 11/15/2021 CLINICAL DATA:  Acute renal insufficiency EXAM: RENAL / URINARY TRACT ULTRASOUND COMPLETE COMPARISON:  None Available. FINDINGS: Right Kidney: Renal measurements: 12.4 x 5.0 x 5.4 cm = volume: 174 mL. Limited evaluation without obvious abnormality. Left Kidney: Renal measurements: 12.7 x 5.1 x 5.3 cm = volume: 177 mL. Limited evaluation without obvious abnormality. Bladder: Not visualized Other: Moderate ascites. IMPRESSION: The study is limited but the kidneys are grossly unremarkable. The bladder was not visualized. Moderate ascites. Electronically Signed   By: Gerome Sam III M.D.   On: 11/15/2021 11:49   VAS [place] LOWER EXTREMITY VENOUS (DVT)  Result Date: 11/15/2021  Lower Venous DVT Study Patient Name:  Kara [name]  Date of Exam:   11/14/2021 Medical Rec #:  [id number]      Accession #:    [id number] Date of Birth: 1986/01/06       Patient Gender: F Patient Age:   89 years Exam Location:  [facility] Procedure:      VAS [place] LOWER EXTREMITY VENOUS (DVT) Referring Phys: Jonny Ruiz PAYNE --------------------------------------------------------------------------------  Indications: Edema. Other Indications: Status post cardiac arrest. Comparison Study: No prior study on file Performing Technologist: Sherren Kerns RVS  Examination Guidelines: A complete evaluation includes B-mode imaging, spectral Doppler, color Doppler, and power Doppler as needed of all accessible portions of each vessel. Bilateral testing is considered an integral part of a complete examination. Limited examinations for reoccurring indications may be performed as noted. The reflux portion of the exam is performed with the patient in reverse Trendelenburg.  +---------+---------------+---------+-----------+----------+--------------+ RIGHT    CompressibilityPhasicitySpontaneityPropertiesThrombus Aging +---------+---------------+---------+-----------+----------+--------------+ CFV      Full           Yes      Yes                                 +---------+---------------+---------+-----------+----------+--------------+ SFJ      Full                                                        +---------+---------------+---------+-----------+----------+--------------+  1 VIEW COMPARISON:  November 16, 2021 FINDINGS: Endotracheal tube has been retracted approximately 4.9 cm now above the carina, tip between clavicular heads. EKG leads project over the chest. Gastric tube courses through in off the field of the radiograph into the stomach, incompletely evaluated. Cardiomediastinal contours and hilar structures are stable. No lobar consolidation.  Subtle LEFT basilar airspace disease. No visible pneumothorax. No acute skeletal process on limited assessment. IMPRESSION: 1. Endotracheal tube has been retracted approximately 4.9 cm above the carina, tip between the clavicular heads. 2. Subtle LEFT basilar airspace disease, increased from the prior study in this patient with reported possible aspiration on previous CT. Electronically Signed   By: Donzetta Kohut M.D.   On: 11/16/2021 09:42   DG CHEST PORT 1 VIEW  Result Date: 11/16/2021 CLINICAL DATA:  Intubation. EXAM: PORTABLE CHEST 1 VIEW COMPARISON:  Chest radiograph dated 11/14/2021. FINDINGS: Endotracheal tube with tip approximately 1 cm above the carina. Enteric tube extends below the diaphragm with side-port in the proximal stomach and tip beyond the inferior margin of the image. Cardiomegaly with vascular congestion. No focal consolidation, pleural effusion, pneumothorax. No acute osseous pathology. IMPRESSION: 1. Endotracheal tube with tip approximately 1 cm above the carina. 2. Cardiomegaly with vascular congestion. Electronically Signed   By: Elgie Collard M.D.   On: 11/16/2021 00:27   US RENAL  Result Date: 11/15/2021 CLINICAL DATA:  Acute renal insufficiency EXAM: RENAL / URINARY TRACT ULTRASOUND COMPLETE COMPARISON:  None Available. FINDINGS: Right Kidney: Renal measurements: 12.4 x 5.0 x 5.4 cm = volume: 174 mL. Limited evaluation without obvious abnormality. Left Kidney: Renal measurements: 12.7 x 5.1 x 5.3 cm = volume: 177 mL. Limited evaluation without obvious abnormality. Bladder: Not visualized Other: Moderate ascites. IMPRESSION: The study is limited but the kidneys are grossly unremarkable. The bladder was not visualized. Moderate ascites. Electronically Signed   By: Gerome Sam III M.D.   On: 11/15/2021 11:49   VAS [place] LOWER EXTREMITY VENOUS (DVT)  Result Date: 11/15/2021  Lower Venous DVT Study Patient Name:  Kara [name]  Date of Exam:   11/14/2021 Medical Rec #:  [id number]      Accession #:    [id number] Date of Birth: 1986/01/06       Patient Gender: F Patient Age:   89 years Exam Location:  [facility] Procedure:      VAS [place] LOWER EXTREMITY VENOUS (DVT) Referring Phys: Jonny Ruiz PAYNE --------------------------------------------------------------------------------  Indications: Edema. Other Indications: Status post cardiac arrest. Comparison Study: No prior study on file Performing Technologist: Sherren Kerns RVS  Examination Guidelines: A complete evaluation includes B-mode imaging, spectral Doppler, color Doppler, and power Doppler as needed of all accessible portions of each vessel. Bilateral testing is considered an integral part of a complete examination. Limited examinations for reoccurring indications may be performed as noted. The reflux portion of the exam is performed with the patient in reverse Trendelenburg.  +---------+---------------+---------+-----------+----------+--------------+ RIGHT    CompressibilityPhasicitySpontaneityPropertiesThrombus Aging +---------+---------------+---------+-----------+----------+--------------+ CFV      Full           Yes      Yes                                 +---------+---------------+---------+-----------+----------+--------------+ SFJ      Full                                                        +---------+---------------+---------+-----------+----------+--------------+  ELECTROPHYSIOLOGY CONSULT NOTE    Patient ID: Kara [name] MRN: [id number], DOB/AGE: 06-04-1985 36 y.o.  Admit date: 11/13/2021 Date of Consult: 11/26/2021  Primary Physician: Lawerance Cruel, MD Primary Cardiologist: None  Electrophysiologist:  New  Referring Provider: Dr. Aileen Fass  Patient Profile: Kara [name] is a 36 y.o. 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 Prolonged QT at the request of Dr. 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  Both of her parents have history of heart failure.  Last echocardiogram obtained on 06/13/2016 demonstrated EF 60 to 65%, normal wall motion, trivial TR.    Patient was recently admitted was right lower extremity cellulitis on 11/04/2021 and was discharged on 11/07/2021.  Limited lower extremity venous Doppler was negative for DVT.  Hospital course was complicated by hypokalemia and hypomagnesemia.  It was felt she was not septic.  She was eventually discharged on cefadroxil and doxycycline for 7 days.  At the patient's request, she was given a limited prescription of Dilaudid for 2 days.    Since discharge, patient reported recovering well at home.  Unfortunately for the 2 days leading up to her admission, husband says patient was having significant nausea and vomiting and could not keep any medication or food down. The day of admission, patient's husband went out to walk the dog, prior to leaving home, patient was in  normal condition.  Upon returning, he went straight to the bathroom to take a shower.  When he came out of the shower around 5:35 PM, he saw the patient was in agonal breathing and immediately called 911.  Fire department arrived first and noted patient was in V. tach and started CPR and shocked the patient twice.  EMS followed soon after and shocked the patient 2 additional times.  During the CPR, patient received a total of 6 rounds of epi and at least round round of amiodarone.  She was intubated and sedated and currently on epi drip.  EKG on arrival sinus rhythm without prolonged QTc. Initial blood work significant for potassium was 2.5.  Decreased significantly from the previous potassium of 4.1 on 11/07/2021.  She had a significantly complicated hospital admission.  Significant events include:  8/2 EEG stopped. MRI 8/3 extubated 8/5 reintubated 8/6 trach 8/7 doing well with PMSV. Sips and chips started.  8/9 tolerated trach collar for two days, mental status improved throughout the day and asking about dogs and work per mom 8/10 Increased agitation overnight.  Approved for LTAC transfer  Cardiology had been following along and pt remained stable without further arrhyhtmia. They followed at a distance pending her recovery.   EKG today shows QTc of ~ 500 ms.  Potassium3.9 (08/10 0448) Magnesium  2.1 (08/10 0448) Creatinine, ser  1.01* (08/10 0448) PLT  221 (08/09 0503) HGB  8.9* (08/09 0503) WBC 11.2* (08/09 0503)  EP asked to weigh in prior to discharge.   ROS: Patient is encephalopathic and/or intubated. History has been obtained from chart review.   Note, patient has a separate epic account,  not performed.    ABNORMALITY - Continuous slow, generalized  IMPRESSION: This study is suggestive of moderate to severe diffuse encephalopathy, nonspecific etiology. No seizures or epileptiform  discharges were seen throughout the recording.  Lora Havens    DG Abd Portable 1V  Result Date: 11/14/2021 CLINICAL DATA:  OG tube placement EXAM: PORTABLE ABDOMEN - 1 VIEW COMPARISON:  Abdominal radiograph 10/22/2019. FINDINGS: Orogastric tube tip and side hole projects over the expected location of the gastric body. Relative paucity of bowel gas in the visualized abdomen. Surgical clips overlie the upper right abdomen. Airspace opacity in the medial left lower lobe may reflect atelectasis versus consolidation. IMPRESSION: 1. Orogastric tube tip and side hole project over the expected location of the gastric body. Relative paucity of bowel gas in the visualized abdomen. 2. Airspace opacity in the medial left lower lobe may reflect atelectasis versus pneumonia in the appropriate clinical context. Electronically Signed   By: Ileana Roup M.D.   On: 11/14/2021 14:00   ECHOCARDIOGRAM COMPLETE  Result Date: 11/14/2021    ECHOCARDIOGRAM REPORT   Patient Name:   Kara [name] Date of Exam: 11/14/2021 Medical Rec #:  [id number]     Height:       67.0 in Accession #:    [id number]    Weight:       200.4 lb Date of Birth:  15-Oct-1985      BSA:          2.024 m Patient Age:    23 years      BP:           101/78 mmHg Patient Gender: F             HR:           98 bpm. Exam Location:  Inpatient Procedure: 2D Echo Indications:    cardiac arrest  History:        Patient has no prior history of Echocardiogram examinations.  Sonographer:    Johny Chess RDCS Referring Phys: [phone] [place]  1. Left ventricular ejection fraction, by estimation, is 45 to 50%. The left ventricle has mildly decreased function. The left ventricle demonstrates global hypokinesis. Indeterminate diastolic filling due to E-A fusion. Tissue Doppler velocities suggest normal myocardial relaxation.  2. Right ventricular systolic function is normal. The right ventricular size is normal. There is normal pulmonary artery systolic  pressure. The estimated right ventricular systolic pressure is 123456 mmHg.  3. The mitral valve is normal in structure. No evidence of mitral valve regurgitation. No evidence of mitral stenosis.  4. The aortic valve is normal in structure. Aortic valve regurgitation is not visualized. No aortic stenosis is present.  5. The inferior vena cava is normal in size with greater than 50% respiratory variability, suggesting right atrial pressure of 3 mmHg. FINDINGS  Left Ventricle: Left ventricular ejection fraction, by estimation, is 45 to 50%. The left ventricle has mildly decreased function. The left ventricle demonstrates global hypokinesis. The left ventricular internal cavity size was normal in size. There is  no left ventricular hypertrophy. Indeterminate diastolic filling due to E-A fusion. Right Ventricle: The right ventricular size is normal. No increase in right ventricular wall thickness. Right ventricular systolic function is normal. There is normal pulmonary artery systolic pressure. The tricuspid regurgitant velocity is 2.02 m/s, and  with an assumed right atrial pressure of 3 mmHg, the estimated right ventricular systolic pressure is 123456 mmHg. Left Atrium: Left atrial size was normal in size. Right  not performed.    ABNORMALITY - Continuous slow, generalized  IMPRESSION: This study is suggestive of moderate to severe diffuse encephalopathy, nonspecific etiology. No seizures or epileptiform  discharges were seen throughout the recording.  Lora Havens    DG Abd Portable 1V  Result Date: 11/14/2021 CLINICAL DATA:  OG tube placement EXAM: PORTABLE ABDOMEN - 1 VIEW COMPARISON:  Abdominal radiograph 10/22/2019. FINDINGS: Orogastric tube tip and side hole projects over the expected location of the gastric body. Relative paucity of bowel gas in the visualized abdomen. Surgical clips overlie the upper right abdomen. Airspace opacity in the medial left lower lobe may reflect atelectasis versus consolidation. IMPRESSION: 1. Orogastric tube tip and side hole project over the expected location of the gastric body. Relative paucity of bowel gas in the visualized abdomen. 2. Airspace opacity in the medial left lower lobe may reflect atelectasis versus pneumonia in the appropriate clinical context. Electronically Signed   By: Ileana Roup M.D.   On: 11/14/2021 14:00   ECHOCARDIOGRAM COMPLETE  Result Date: 11/14/2021    ECHOCARDIOGRAM REPORT   Patient Name:   Kara [name] Date of Exam: 11/14/2021 Medical Rec #:  [id number]     Height:       67.0 in Accession #:    [id number]    Weight:       200.4 lb Date of Birth:  15-Oct-1985      BSA:          2.024 m Patient Age:    23 years      BP:           101/78 mmHg Patient Gender: F             HR:           98 bpm. Exam Location:  Inpatient Procedure: 2D Echo Indications:    cardiac arrest  History:        Patient has no prior history of Echocardiogram examinations.  Sonographer:    Johny Chess RDCS Referring Phys: [phone] [place]  1. Left ventricular ejection fraction, by estimation, is 45 to 50%. The left ventricle has mildly decreased function. The left ventricle demonstrates global hypokinesis. Indeterminate diastolic filling due to E-A fusion. Tissue Doppler velocities suggest normal myocardial relaxation.  2. Right ventricular systolic function is normal. The right ventricular size is normal. There is normal pulmonary artery systolic  pressure. The estimated right ventricular systolic pressure is 123456 mmHg.  3. The mitral valve is normal in structure. No evidence of mitral valve regurgitation. No evidence of mitral stenosis.  4. The aortic valve is normal in structure. Aortic valve regurgitation is not visualized. No aortic stenosis is present.  5. The inferior vena cava is normal in size with greater than 50% respiratory variability, suggesting right atrial pressure of 3 mmHg. FINDINGS  Left Ventricle: Left ventricular ejection fraction, by estimation, is 45 to 50%. The left ventricle has mildly decreased function. The left ventricle demonstrates global hypokinesis. The left ventricular internal cavity size was normal in size. There is  no left ventricular hypertrophy. Indeterminate diastolic filling due to E-A fusion. Right Ventricle: The right ventricular size is normal. No increase in right ventricular wall thickness. Right ventricular systolic function is normal. There is normal pulmonary artery systolic pressure. The tricuspid regurgitant velocity is 2.02 m/s, and  with an assumed right atrial pressure of 3 mmHg, the estimated right ventricular systolic pressure is 123456 mmHg. Left Atrium: Left atrial size was normal in size. Right  not performed.    ABNORMALITY - Continuous slow, generalized  IMPRESSION: This study is suggestive of moderate to severe diffuse encephalopathy, nonspecific etiology. No seizures or epileptiform  discharges were seen throughout the recording.  Lora Havens    DG Abd Portable 1V  Result Date: 11/14/2021 CLINICAL DATA:  OG tube placement EXAM: PORTABLE ABDOMEN - 1 VIEW COMPARISON:  Abdominal radiograph 10/22/2019. FINDINGS: Orogastric tube tip and side hole projects over the expected location of the gastric body. Relative paucity of bowel gas in the visualized abdomen. Surgical clips overlie the upper right abdomen. Airspace opacity in the medial left lower lobe may reflect atelectasis versus consolidation. IMPRESSION: 1. Orogastric tube tip and side hole project over the expected location of the gastric body. Relative paucity of bowel gas in the visualized abdomen. 2. Airspace opacity in the medial left lower lobe may reflect atelectasis versus pneumonia in the appropriate clinical context. Electronically Signed   By: Ileana Roup M.D.   On: 11/14/2021 14:00   ECHOCARDIOGRAM COMPLETE  Result Date: 11/14/2021    ECHOCARDIOGRAM REPORT   Patient Name:   Kara [name] Date of Exam: 11/14/2021 Medical Rec #:  [id number]     Height:       67.0 in Accession #:    [id number]    Weight:       200.4 lb Date of Birth:  15-Oct-1985      BSA:          2.024 m Patient Age:    23 years      BP:           101/78 mmHg Patient Gender: F             HR:           98 bpm. Exam Location:  Inpatient Procedure: 2D Echo Indications:    cardiac arrest  History:        Patient has no prior history of Echocardiogram examinations.  Sonographer:    Johny Chess RDCS Referring Phys: [phone] [place]  1. Left ventricular ejection fraction, by estimation, is 45 to 50%. The left ventricle has mildly decreased function. The left ventricle demonstrates global hypokinesis. Indeterminate diastolic filling due to E-A fusion. Tissue Doppler velocities suggest normal myocardial relaxation.  2. Right ventricular systolic function is normal. The right ventricular size is normal. There is normal pulmonary artery systolic  pressure. The estimated right ventricular systolic pressure is 123456 mmHg.  3. The mitral valve is normal in structure. No evidence of mitral valve regurgitation. No evidence of mitral stenosis.  4. The aortic valve is normal in structure. Aortic valve regurgitation is not visualized. No aortic stenosis is present.  5. The inferior vena cava is normal in size with greater than 50% respiratory variability, suggesting right atrial pressure of 3 mmHg. FINDINGS  Left Ventricle: Left ventricular ejection fraction, by estimation, is 45 to 50%. The left ventricle has mildly decreased function. The left ventricle demonstrates global hypokinesis. The left ventricular internal cavity size was normal in size. There is  no left ventricular hypertrophy. Indeterminate diastolic filling due to E-A fusion. Right Ventricle: The right ventricular size is normal. No increase in right ventricular wall thickness. Right ventricular systolic function is normal. There is normal pulmonary artery systolic pressure. The tricuspid regurgitant velocity is 2.02 m/s, and  with an assumed right atrial pressure of 3 mmHg, the estimated right ventricular systolic pressure is 123456 mmHg. Left Atrium: Left atrial size was normal in size. Right  not performed.    ABNORMALITY - Continuous slow, generalized  IMPRESSION: This study is suggestive of moderate to severe diffuse encephalopathy, nonspecific etiology. No seizures or epileptiform  discharges were seen throughout the recording.  Lora Havens    DG Abd Portable 1V  Result Date: 11/14/2021 CLINICAL DATA:  OG tube placement EXAM: PORTABLE ABDOMEN - 1 VIEW COMPARISON:  Abdominal radiograph 10/22/2019. FINDINGS: Orogastric tube tip and side hole projects over the expected location of the gastric body. Relative paucity of bowel gas in the visualized abdomen. Surgical clips overlie the upper right abdomen. Airspace opacity in the medial left lower lobe may reflect atelectasis versus consolidation. IMPRESSION: 1. Orogastric tube tip and side hole project over the expected location of the gastric body. Relative paucity of bowel gas in the visualized abdomen. 2. Airspace opacity in the medial left lower lobe may reflect atelectasis versus pneumonia in the appropriate clinical context. Electronically Signed   By: Ileana Roup M.D.   On: 11/14/2021 14:00   ECHOCARDIOGRAM COMPLETE  Result Date: 11/14/2021    ECHOCARDIOGRAM REPORT   Patient Name:   Kara [name] Date of Exam: 11/14/2021 Medical Rec #:  [id number]     Height:       67.0 in Accession #:    [id number]    Weight:       200.4 lb Date of Birth:  15-Oct-1985      BSA:          2.024 m Patient Age:    23 years      BP:           101/78 mmHg Patient Gender: F             HR:           98 bpm. Exam Location:  Inpatient Procedure: 2D Echo Indications:    cardiac arrest  History:        Patient has no prior history of Echocardiogram examinations.  Sonographer:    Johny Chess RDCS Referring Phys: [phone] [place]  1. Left ventricular ejection fraction, by estimation, is 45 to 50%. The left ventricle has mildly decreased function. The left ventricle demonstrates global hypokinesis. Indeterminate diastolic filling due to E-A fusion. Tissue Doppler velocities suggest normal myocardial relaxation.  2. Right ventricular systolic function is normal. The right ventricular size is normal. There is normal pulmonary artery systolic  pressure. The estimated right ventricular systolic pressure is 123456 mmHg.  3. The mitral valve is normal in structure. No evidence of mitral valve regurgitation. No evidence of mitral stenosis.  4. The aortic valve is normal in structure. Aortic valve regurgitation is not visualized. No aortic stenosis is present.  5. The inferior vena cava is normal in size with greater than 50% respiratory variability, suggesting right atrial pressure of 3 mmHg. FINDINGS  Left Ventricle: Left ventricular ejection fraction, by estimation, is 45 to 50%. The left ventricle has mildly decreased function. The left ventricle demonstrates global hypokinesis. The left ventricular internal cavity size was normal in size. There is  no left ventricular hypertrophy. Indeterminate diastolic filling due to E-A fusion. Right Ventricle: The right ventricular size is normal. No increase in right ventricular wall thickness. Right ventricular systolic function is normal. There is normal pulmonary artery systolic pressure. The tricuspid regurgitant velocity is 2.02 m/s, and  with an assumed right atrial pressure of 3 mmHg, the estimated right ventricular systolic pressure is 123456 mmHg. Left Atrium: Left atrial size was normal in size. Right  1 VIEW COMPARISON:  November 16, 2021 FINDINGS: Endotracheal tube has been retracted approximately 4.9 cm now above the carina, tip between clavicular heads. EKG leads project over the chest. Gastric tube courses through in off the field of the radiograph into the stomach, incompletely evaluated. Cardiomediastinal contours and hilar structures are stable. No lobar consolidation.  Subtle LEFT basilar airspace disease. No visible pneumothorax. No acute skeletal process on limited assessment. IMPRESSION: 1. Endotracheal tube has been retracted approximately 4.9 cm above the carina, tip between the clavicular heads. 2. Subtle LEFT basilar airspace disease, increased from the prior study in this patient with reported possible aspiration on previous CT. Electronically Signed   By: Donzetta Kohut M.D.   On: 11/16/2021 09:42   DG CHEST PORT 1 VIEW  Result Date: 11/16/2021 CLINICAL DATA:  Intubation. EXAM: PORTABLE CHEST 1 VIEW COMPARISON:  Chest radiograph dated 11/14/2021. FINDINGS: Endotracheal tube with tip approximately 1 cm above the carina. Enteric tube extends below the diaphragm with side-port in the proximal stomach and tip beyond the inferior margin of the image. Cardiomegaly with vascular congestion. No focal consolidation, pleural effusion, pneumothorax. No acute osseous pathology. IMPRESSION: 1. Endotracheal tube with tip approximately 1 cm above the carina. 2. Cardiomegaly with vascular congestion. Electronically Signed   By: Elgie Collard M.D.   On: 11/16/2021 00:27   US RENAL  Result Date: 11/15/2021 CLINICAL DATA:  Acute renal insufficiency EXAM: RENAL / URINARY TRACT ULTRASOUND COMPLETE COMPARISON:  None Available. FINDINGS: Right Kidney: Renal measurements: 12.4 x 5.0 x 5.4 cm = volume: 174 mL. Limited evaluation without obvious abnormality. Left Kidney: Renal measurements: 12.7 x 5.1 x 5.3 cm = volume: 177 mL. Limited evaluation without obvious abnormality. Bladder: Not visualized Other: Moderate ascites. IMPRESSION: The study is limited but the kidneys are grossly unremarkable. The bladder was not visualized. Moderate ascites. Electronically Signed   By: Gerome Sam III M.D.   On: 11/15/2021 11:49   VAS [place] LOWER EXTREMITY VENOUS (DVT)  Result Date: 11/15/2021  Lower Venous DVT Study Patient Name:  Kara [name]  Date of Exam:   11/14/2021 Medical Rec #:  [id number]      Accession #:    [id number] Date of Birth: 1986/01/06       Patient Gender: F Patient Age:   89 years Exam Location:  [facility] Procedure:      VAS [place] LOWER EXTREMITY VENOUS (DVT) Referring Phys: Jonny Ruiz PAYNE --------------------------------------------------------------------------------  Indications: Edema. Other Indications: Status post cardiac arrest. Comparison Study: No prior study on file Performing Technologist: Sherren Kerns RVS  Examination Guidelines: A complete evaluation includes B-mode imaging, spectral Doppler, color Doppler, and power Doppler as needed of all accessible portions of each vessel. Bilateral testing is considered an integral part of a complete examination. Limited examinations for reoccurring indications may be performed as noted. The reflux portion of the exam is performed with the patient in reverse Trendelenburg.  +---------+---------------+---------+-----------+----------+--------------+ RIGHT    CompressibilityPhasicitySpontaneityPropertiesThrombus Aging +---------+---------------+---------+-----------+----------+--------------+ CFV      Full           Yes      Yes                                 +---------+---------------+---------+-----------+----------+--------------+ SFJ      Full                                                        +---------+---------------+---------+-----------+----------+--------------+  not performed.    ABNORMALITY - Continuous slow, generalized  IMPRESSION: This study is suggestive of moderate to severe diffuse encephalopathy, nonspecific etiology. No seizures or epileptiform  discharges were seen throughout the recording.  Lora Havens    DG Abd Portable 1V  Result Date: 11/14/2021 CLINICAL DATA:  OG tube placement EXAM: PORTABLE ABDOMEN - 1 VIEW COMPARISON:  Abdominal radiograph 10/22/2019. FINDINGS: Orogastric tube tip and side hole projects over the expected location of the gastric body. Relative paucity of bowel gas in the visualized abdomen. Surgical clips overlie the upper right abdomen. Airspace opacity in the medial left lower lobe may reflect atelectasis versus consolidation. IMPRESSION: 1. Orogastric tube tip and side hole project over the expected location of the gastric body. Relative paucity of bowel gas in the visualized abdomen. 2. Airspace opacity in the medial left lower lobe may reflect atelectasis versus pneumonia in the appropriate clinical context. Electronically Signed   By: Ileana Roup M.D.   On: 11/14/2021 14:00   ECHOCARDIOGRAM COMPLETE  Result Date: 11/14/2021    ECHOCARDIOGRAM REPORT   Patient Name:   Kara [name] Date of Exam: 11/14/2021 Medical Rec #:  [id number]     Height:       67.0 in Accession #:    [id number]    Weight:       200.4 lb Date of Birth:  15-Oct-1985      BSA:          2.024 m Patient Age:    23 years      BP:           101/78 mmHg Patient Gender: F             HR:           98 bpm. Exam Location:  Inpatient Procedure: 2D Echo Indications:    cardiac arrest  History:        Patient has no prior history of Echocardiogram examinations.  Sonographer:    Johny Chess RDCS Referring Phys: [phone] [place]  1. Left ventricular ejection fraction, by estimation, is 45 to 50%. The left ventricle has mildly decreased function. The left ventricle demonstrates global hypokinesis. Indeterminate diastolic filling due to E-A fusion. Tissue Doppler velocities suggest normal myocardial relaxation.  2. Right ventricular systolic function is normal. The right ventricular size is normal. There is normal pulmonary artery systolic  pressure. The estimated right ventricular systolic pressure is 123456 mmHg.  3. The mitral valve is normal in structure. No evidence of mitral valve regurgitation. No evidence of mitral stenosis.  4. The aortic valve is normal in structure. Aortic valve regurgitation is not visualized. No aortic stenosis is present.  5. The inferior vena cava is normal in size with greater than 50% respiratory variability, suggesting right atrial pressure of 3 mmHg. FINDINGS  Left Ventricle: Left ventricular ejection fraction, by estimation, is 45 to 50%. The left ventricle has mildly decreased function. The left ventricle demonstrates global hypokinesis. The left ventricular internal cavity size was normal in size. There is  no left ventricular hypertrophy. Indeterminate diastolic filling due to E-A fusion. Right Ventricle: The right ventricular size is normal. No increase in right ventricular wall thickness. Right ventricular systolic function is normal. There is normal pulmonary artery systolic pressure. The tricuspid regurgitant velocity is 2.02 m/s, and  with an assumed right atrial pressure of 3 mmHg, the estimated right ventricular systolic pressure is 123456 mmHg. Left Atrium: Left atrial size was normal in size. Right  1 VIEW COMPARISON:  November 16, 2021 FINDINGS: Endotracheal tube has been retracted approximately 4.9 cm now above the carina, tip between clavicular heads. EKG leads project over the chest. Gastric tube courses through in off the field of the radiograph into the stomach, incompletely evaluated. Cardiomediastinal contours and hilar structures are stable. No lobar consolidation.  Subtle LEFT basilar airspace disease. No visible pneumothorax. No acute skeletal process on limited assessment. IMPRESSION: 1. Endotracheal tube has been retracted approximately 4.9 cm above the carina, tip between the clavicular heads. 2. Subtle LEFT basilar airspace disease, increased from the prior study in this patient with reported possible aspiration on previous CT. Electronically Signed   By: Donzetta Kohut M.D.   On: 11/16/2021 09:42   DG CHEST PORT 1 VIEW  Result Date: 11/16/2021 CLINICAL DATA:  Intubation. EXAM: PORTABLE CHEST 1 VIEW COMPARISON:  Chest radiograph dated 11/14/2021. FINDINGS: Endotracheal tube with tip approximately 1 cm above the carina. Enteric tube extends below the diaphragm with side-port in the proximal stomach and tip beyond the inferior margin of the image. Cardiomegaly with vascular congestion. No focal consolidation, pleural effusion, pneumothorax. No acute osseous pathology. IMPRESSION: 1. Endotracheal tube with tip approximately 1 cm above the carina. 2. Cardiomegaly with vascular congestion. Electronically Signed   By: Elgie Collard M.D.   On: 11/16/2021 00:27   US RENAL  Result Date: 11/15/2021 CLINICAL DATA:  Acute renal insufficiency EXAM: RENAL / URINARY TRACT ULTRASOUND COMPLETE COMPARISON:  None Available. FINDINGS: Right Kidney: Renal measurements: 12.4 x 5.0 x 5.4 cm = volume: 174 mL. Limited evaluation without obvious abnormality. Left Kidney: Renal measurements: 12.7 x 5.1 x 5.3 cm = volume: 177 mL. Limited evaluation without obvious abnormality. Bladder: Not visualized Other: Moderate ascites. IMPRESSION: The study is limited but the kidneys are grossly unremarkable. The bladder was not visualized. Moderate ascites. Electronically Signed   By: Gerome Sam III M.D.   On: 11/15/2021 11:49   VAS [place] LOWER EXTREMITY VENOUS (DVT)  Result Date: 11/15/2021  Lower Venous DVT Study Patient Name:  Kara [name]  Date of Exam:   11/14/2021 Medical Rec #:  [id number]      Accession #:    [id number] Date of Birth: 1986/01/06       Patient Gender: F Patient Age:   89 years Exam Location:  [facility] Procedure:      VAS [place] LOWER EXTREMITY VENOUS (DVT) Referring Phys: Jonny Ruiz PAYNE --------------------------------------------------------------------------------  Indications: Edema. Other Indications: Status post cardiac arrest. Comparison Study: No prior study on file Performing Technologist: Sherren Kerns RVS  Examination Guidelines: A complete evaluation includes B-mode imaging, spectral Doppler, color Doppler, and power Doppler as needed of all accessible portions of each vessel. Bilateral testing is considered an integral part of a complete examination. Limited examinations for reoccurring indications may be performed as noted. The reflux portion of the exam is performed with the patient in reverse Trendelenburg.  +---------+---------------+---------+-----------+----------+--------------+ RIGHT    CompressibilityPhasicitySpontaneityPropertiesThrombus Aging +---------+---------------+---------+-----------+----------+--------------+ CFV      Full           Yes      Yes                                 +---------+---------------+---------+-----------+----------+--------------+ SFJ      Full                                                        +---------+---------------+---------+-----------+----------+--------------+

## 2021-11-26 NOTE — Progress Notes (Signed)
PHARMACY - TOTAL PARENTERAL NUTRITION CONSULT NOTE   Indication: Tube feed intolerance   Patient Measurements: Height: 5\' 7"  (170.2 cm) Weight: 94.1 kg (207 lb 7.3 oz) IBW/kg (Calculated) : 61.6   Body mass index is 32.49 kg/m. Usual Weight: 200 lbs per chart   Assessment:  36 yo W with with VT/Vfib cardiac arrest s/p about 20 minutes of CPR before ROSC with anoxic brain injury. Per family, patient had  chronic NVD and labile food tolerance prior to admission (she could tolerate some foods but others would send her to the bathroom for 1 hour or cause vomiting). Unclear if food intolerance is due to chronic pancreatitis or other cause. Since admission, tried vital high protein, vital AF 1.2 cal, and vital 1.5 cal at different rates but patient continued to have vomiting and diarrhea despite low rates. Now with rectal tube. Tube feeds discontinued 8/4. Pharmacy consulted for TPN.   Glucose / Insulin: BG 200s, A1C 4.5, received 9 units SSI/24 hrs Electrolytes: Na 145, K 3.9, Phos 2.5, Mg 2.1 CO2 25 Renal: Scr 2.22 > 1.01 (BL ~1)  Hepatic: T. Bili 1.2, other WNL, Albumin 2.3, triglycerides 231 > 200 Intake / Output; MIVF: UOP 0.5 ml/kg/hr. FMS 10/4  GI Imaging: 7/28 KUB: Dilated common bile duct with possible choledocholithiasis 7/29 KUB: Relative paucity of bowel gas in the visualized abdomen. 7/31 KUB: Nonobstructive bowel gas pattern. 8/1 KUB: Increasing gaseous distention of small bowel and right colon, nonspecific GI Surgeries / Procedures: none  Central access: 8/6  TPN start date: 8/7   Nutritional Goals: Goal TPN rate is 80 mL/hr (provides 102 g (53 g/L) of protein, 298 g dextrose (15.5%), 63 g lipids (33 g/L) and 2053 kcals per day  RD Assessment: Estimated Needs Total Energy Estimated Needs: 2000-2200 Total Protein Estimated Needs: 100-115 grams Total Fluid Estimated Needs: >/= 2 L  Current Nutrition:  NPO  Plan:  Increase TPN on 8/9 to goal rate of 80 ml/hr which  will provide about 100% of needs Electrolytes in TPN: Na 0 mEq/L, K 45 mEq/L, Ca 5 mEq/L, Mg 7 mEq/L, Phos 7 mmol/L, Cl 120 > 114, Max acetate  Add standard MVI and trace elements to TPN  Increase to moderate SSI frequency q4hrs  Standard TPN labs on Mon/Thurs and daily during initiation, recheck TGs   Thank you for allowing pharmacy to be a part of this patient's care.  10/9, PharmD Clinical Pharmacist

## 2021-11-26 NOTE — Progress Notes (Signed)
Verbal order received for safety sitter, pt has been agitated, pulling lines, tubes, family will not be staying at bedside. Will continue to monitor

## 2021-11-26 NOTE — Discharge Instructions (Signed)
Consider using [employer] to identify QT prolonging agent.

## 2021-11-27 DIAGNOSIS — G40909 Epilepsy, unspecified, not intractable, without status epilepticus: Secondary | ICD-10-CM

## 2021-11-27 DIAGNOSIS — F1011 Alcohol abuse, in remission: Secondary | ICD-10-CM | POA: Diagnosis not present

## 2021-11-27 DIAGNOSIS — I469 Cardiac arrest, cause unspecified: Secondary | ICD-10-CM | POA: Diagnosis not present

## 2021-11-27 DIAGNOSIS — J9621 Acute and chronic respiratory failure with hypoxia: Secondary | ICD-10-CM

## 2021-11-27 DIAGNOSIS — G931 Anoxic brain damage, not elsewhere classified: Secondary | ICD-10-CM | POA: Diagnosis not present

## 2021-11-27 DIAGNOSIS — Z930 Tracheostomy status: Secondary | ICD-10-CM

## 2021-11-27 DIAGNOSIS — Z8674 Personal history of sudden cardiac arrest: Secondary | ICD-10-CM

## 2021-11-27 LAB — CBC WITH DIFFERENTIAL/PLATELET
Abs Immature Granulocytes: 0.27 10*3/uL — ABNORMAL HIGH (ref 0.00–0.07)
Basophils Absolute: 0.2 10*3/uL — ABNORMAL HIGH (ref 0.0–0.1)
Basophils Relative: 1 %
Eosinophils Absolute: 0.6 10*3/uL — ABNORMAL HIGH (ref 0.0–0.5)
Eosinophils Relative: 3 %
HCT: 28.8 % — ABNORMAL LOW (ref 36.0–46.0)
Hemoglobin: 9.1 g/dL — ABNORMAL LOW (ref 12.0–15.0)
Immature Granulocytes: 2 %
Lymphocytes Relative: 15 %
Lymphs Abs: 2.6 10*3/uL (ref 0.7–4.0)
MCH: 32.2 pg (ref 26.0–34.0)
MCHC: 31.6 g/dL (ref 30.0–36.0)
MCV: 101.8 fL — ABNORMAL HIGH (ref 80.0–100.0)
Monocytes Absolute: 0.9 10*3/uL (ref 0.1–1.0)
Monocytes Relative: 5 %
Neutro Abs: 12.4 10*3/uL — ABNORMAL HIGH (ref 1.7–7.7)
Neutrophils Relative %: 74 %
Platelets: 217 10*3/uL (ref 150–400)
RBC: 2.83 MIL/uL — ABNORMAL LOW (ref 3.87–5.11)
RDW: 14.6 % (ref 11.5–15.5)
WBC: 16.9 10*3/uL — ABNORMAL HIGH (ref 4.0–10.5)
nRBC: 0.1 % (ref 0.0–0.2)

## 2021-11-27 LAB — COMPREHENSIVE METABOLIC PANEL
ALT: 22 U/L (ref 0–44)
AST: 44 U/L — ABNORMAL HIGH (ref 15–41)
Albumin: 2.2 g/dL — ABNORMAL LOW (ref 3.5–5.0)
Alkaline Phosphatase: 278 U/L — ABNORMAL HIGH (ref 38–126)
Anion gap: 9 (ref 5–15)
BUN: 14 mg/dL (ref 6–20)
CO2: 23 mmol/L (ref 22–32)
Calcium: 8.3 mg/dL — ABNORMAL LOW (ref 8.9–10.3)
Chloride: 107 mmol/L (ref 98–111)
Creatinine, Ser: 0.87 mg/dL (ref 0.44–1.00)
GFR, Estimated: >60 mL/min (ref >60)
Glucose, Bld: 143 mg/dL — ABNORMAL HIGH (ref 70–99)
Potassium: 3.7 mmol/L (ref 3.5–5.1)
Sodium: 139 mmol/L (ref 135–145)
Total Bilirubin: 1.3 mg/dL — ABNORMAL HIGH (ref 0.3–1.2)
Total Protein: 6.1 g/dL — ABNORMAL LOW (ref 6.5–8.1)

## 2021-11-27 LAB — HEMOGLOBIN A1C
Hgb A1c MFr Bld: 4.5 % — ABNORMAL LOW (ref 4.8–5.6)
Mean Plasma Glucose: 82.45 mg/dL

## 2021-11-27 LAB — TSH: TSH: 13.439 u[IU]/mL — ABNORMAL HIGH (ref 0.350–4.500)

## 2021-11-27 NOTE — Consult Note (Signed)
Pulmonary Critical Care Medicine [facility] [place]  PULMONARY SERVICE  Date of Service: 11/27/2021  PULMONARY CRITICAL CARE CONSULT   Kara [name]  IHK:[id number]  DOB: Oct 26, 1985   DOA: 11/26/2021  Referring Physician: Luna Kitchens, MD  HPI: Kara [name] is a 36 y.o. female seen for follow up of Acute on Chronic Respiratory Failure.  Patient has multiple medical problems including history of alcohol abuse diastolic dysfunction GERD headaches sepsis syncope came into the hospital with right-sided knee pain apparently had taken a fall 2 weeks prior to admission.  There were not any obvious wounds at that time.  Patient however had noted a progression and worsening of the swelling of her knee.  She was admitted for a total of 3 days and then discharged home.  The patient was subsequently found down and full arrest.  She was brought back to the hospital admitted to the ICU.  Subsequently she had a tracheostomy done.  She is now transferred to our facility for further management and weaning.  Review of Systems:  ROS performed and is unremarkable other than noted above.  Past Medical History:  Diagnosis Date   Alcoholism in remission (HCC)    per pt in remission since 2017   Anemia    AR (allergic rhinitis)    allergist-- dr Lucie Leather   Biliary dyskinesia    Diastolic CHF (HCC)    followd by dr Demetrius Charity. [facility]   Environmental allergies    GERD (gastroesophageal reflux disease)    Headache    History of acute pancreatitis    alcoholic pancreatitis ,  2016 and 2017   History of sepsis 06/12/2016   with CAP   History of syncope    History of thrombosis 01/29/2015   splenic vein thrombosis-- treated with coumadin--- readmitted for abd. hematoma due to coumadin treated with IR embolization of the IMA branches   Hypertension    Polyneuropathy    Prolonged QT syndrome    cardiologist-- dr Eden Emms (notes in epic)   Severe persistent asthma    pulmonologist-- dr dr Belva Crome (notes  in epic)    Past Surgical History:  Procedure Laterality Date   CHOLECYSTECTOMY N/A 06/21/2018   Procedure: LAPAROSCOPIC CHOLECYSTECTOMY;  Surgeon: Abigail Miyamoto, MD;  Location: [facility];  Service: General;  Laterality: N/A;   ESOPHAGOGASTRODUODENOSCOPY N/A 07/17/2014   Procedure: ESOPHAGOGASTRODUODENOSCOPY (EGD);  Surgeon: Dorena Cookey, MD;  Location: [facility];  Service: Endoscopy;  Laterality: N/A;   TYMPANOSTOMY TUBE PLACEMENT Bilateral 11 MONTHS OLD   VIDEO BRONCHOSCOPY Bilateral 10/08/2016   Procedure: VIDEO BRONCHOSCOPY WITH FLUORO;  Surgeon: Chilton Greathouse, MD;  Location: [facility];  Service: Cardiopulmonary;  Laterality: Bilateral;    Social History:    reports that she has been smoking cigarettes. She has a 1.25 pack-year smoking history. She has never used smokeless tobacco. She reports that she does not currently use alcohol. She reports that she does not currently use drugs.  Family History: Non-Contributory to the present illness  Allergies  Allergen Reactions   Acetaminophen Diarrhea and Nausea And Vomiting   Nsaids Diarrhea and Nausea And Vomiting   Other Hives and Itching    Reaction to tree nuts   Peanuts [Peanut Oil] Hives and Itching   Triamterene-Hctz Nausea Only and Other (See Comments)    dizzy    Medications: Reviewed on Rounds  Physical Exam:  Vitals: Temperature is 94.5 pulse 100 respiratory 30 blood pressure is 134/90 saturations 95%  Ventilator Settings currently off the ventilator on T  collar FiO2 35%  General: Comfortable at this time Eyes: Grossly normal lids, irises & conjunctiva ENT: grossly tongue is normal Neck: no obvious mass Cardiovascular: S1-S2 normal no gallop or rub Respiratory: Scattered coarse rhonchi Abdomen: Obese and soft Skin: no rash seen on limited exam Musculoskeletal: not rigid Psychiatric:unable to assess Neurologic: no seizure no involuntary movements         Labs on Admission:  Basic Metabolic  Panel: Recent Labs  Lab 11/27/21 0457  NA 139  K 3.7  CL 107  CO2 23  GLUCOSE 143*  BUN 14  CREATININE 0.87  CALCIUM 8.3*    No results for input(s): "PHART", "PCO2ART", "PO2ART", "HCO3", "O2SAT" in the last 168 hours.  Liver Function Tests: Recent Labs  Lab 11/27/21 0457  AST 44*  ALT 22  ALKPHOS 278*  BILITOT 1.3*  PROT 6.1*  ALBUMIN 2.2*   No results for input(s): "LIPASE", "AMYLASE" in the last 168 hours. No results for input(s): "AMMONIA" in the last 168 hours.  CBC: Recent Labs  Lab 11/27/21 0457  WBC 16.9*  NEUTROABS 12.4*  HGB 9.1*  HCT 28.8*  MCV 101.8*  PLT 217    Cardiac Enzymes: No results for input(s): "CKTOTAL", "CKMB", "CKMBINDEX", "TROPONINI" in the last 168 hours.  BNP (last 3 results) Recent Labs    12/07/20 1043  BNP 132.9*    ProBNP (last 3 results) No results for input(s): "PROBNP" in the last 8760 hours.   Radiological Exams on Admission: DG Abd 1 View  Result Date: 11/26/2021 CLINICAL DATA:  NG tube placement EXAM: ABDOMEN - 1 VIEW COMPARISON:  11/25/2021 FINDINGS: Feeding tube is in place within the proximal jejunum. Nonobstructive bowel gas pattern. IMPRESSION: Feeding tube tip in the proximal jejunum. Electronically Signed   By: Charlett Nose M.D.   On: 11/26/2021 18:01   DG Chest Port 1 View  Result Date: 11/26/2021 CLINICAL DATA:  Respiratory distress, NG tube placement EXAM: PORTABLE CHEST 1 VIEW COMPARISON:  11/25/2021 FINDINGS: Right PICC line and tracheostomy remain in place, unchanged. Feeding tube is seen entering the stomach. Bilateral airspace disease, most pronounced in the upper lobes. Heart is borderline in size. No effusions or acute bony abnormality. IMPRESSION: Stable bilateral airspace disease. Stable support apparatus Electronically Signed   By: Charlett Nose M.D.   On: 11/26/2021 18:00    Assessment/Plan Active Problems:   Alcohol abuse, in remission   Acute on chronic respiratory failure with hypoxia  (HCC)   Cardiac arrest (HCC)   Seizure disorder (HCC)   Chronic anoxic encephalopathy (HCC)   Tracheostomy status (HCC)   Acute on chronic respiratory failure hypoxia the patient at this time is on T collar.  She had a tracheostomy done according to the mother on Sunday and she appears to be doing okay with it we will go ahead and have respiratory therapy start with PMV and perhaps consider capping trials. Cardiac arrest rhythm is stable at this time we will continue to monitor closely. Seizure patient apparently had a seizure may be related to hypoxia we will continue to monitor Anoxic encephalopathy we will need to continue to follow along closely.  She is awake but unsure about her overall mental state Tracheostomy status currently will remain in place  I have personally seen and evaluated the patient, evaluated laboratory and imaging results, formulated the assessment and plan and placed orders. The Patient requires high complexity decision making with multiple systems involvement.  Case was discussed on Rounds with the Respiratory Therapy Director  and the Respiratory staff Time Spent 62minutes  Allyne Gee, MD [facility] Pulmonary Critical Care Medicine Sleep Medicine

## 2021-11-28 LAB — MAGNESIUM
Magnesium: 1 mg/dL — ABNORMAL LOW (ref 1.7–2.4)
Magnesium: 1.8 mg/dL (ref 1.7–2.4)

## 2021-11-28 LAB — PHOSPHORUS: Phosphorus: 2.7 mg/dL (ref 2.5–4.6)

## 2021-11-28 LAB — TRIGLYCERIDES: Triglycerides: 251 mg/dL — ABNORMAL HIGH (ref <150)

## 2021-11-29 DIAGNOSIS — G931 Anoxic brain damage, not elsewhere classified: Secondary | ICD-10-CM | POA: Diagnosis not present

## 2021-11-29 DIAGNOSIS — F1011 Alcohol abuse, in remission: Secondary | ICD-10-CM | POA: Diagnosis not present

## 2021-11-29 DIAGNOSIS — J9621 Acute and chronic respiratory failure with hypoxia: Secondary | ICD-10-CM | POA: Diagnosis not present

## 2021-11-29 DIAGNOSIS — I469 Cardiac arrest, cause unspecified: Secondary | ICD-10-CM | POA: Diagnosis not present

## 2021-11-29 LAB — URINALYSIS, ROUTINE W REFLEX MICROSCOPIC
Bilirubin Urine: NEGATIVE
Glucose, UA: NEGATIVE mg/dL
Ketones, ur: NEGATIVE mg/dL
Leukocytes,Ua: NEGATIVE
Nitrite: NEGATIVE
Protein, ur: 30 mg/dL — AB
Specific Gravity, Urine: 1.011 (ref 1.005–1.030)
pH: 6 (ref 5.0–8.0)

## 2021-11-29 LAB — BASIC METABOLIC PANEL
Anion gap: 9 (ref 5–15)
BUN: 8 mg/dL (ref 6–20)
CO2: 23 mmol/L (ref 22–32)
Calcium: 8 mg/dL — ABNORMAL LOW (ref 8.9–10.3)
Chloride: 106 mmol/L (ref 98–111)
Creatinine, Ser: 0.77 mg/dL (ref 0.44–1.00)
GFR, Estimated: >60 mL/min (ref >60)
Glucose, Bld: 163 mg/dL — ABNORMAL HIGH (ref 70–99)
Potassium: 3.5 mmol/L (ref 3.5–5.1)
Sodium: 138 mmol/L (ref 135–145)

## 2021-11-29 LAB — CBC
HCT: 29.6 % — ABNORMAL LOW (ref 36.0–46.0)
Hemoglobin: 9.5 g/dL — ABNORMAL LOW (ref 12.0–15.0)
MCH: 31.7 pg (ref 26.0–34.0)
MCHC: 32.1 g/dL (ref 30.0–36.0)
MCV: 98.7 fL (ref 80.0–100.0)
Platelets: 188 10*3/uL (ref 150–400)
RBC: 3 MIL/uL — ABNORMAL LOW (ref 3.87–5.11)
RDW: 13.9 % (ref 11.5–15.5)
WBC: 16.4 10*3/uL — ABNORMAL HIGH (ref 4.0–10.5)
nRBC: 0 % (ref 0.0–0.2)

## 2021-11-29 LAB — CULTURE, BLOOD (ROUTINE X 2)
Culture: NO GROWTH
Culture: NO GROWTH
Special Requests: ADEQUATE
Special Requests: ADEQUATE

## 2021-11-29 LAB — MAGNESIUM
Magnesium: 1.7 mg/dL (ref 1.7–2.4)
Magnesium: 1.8 mg/dL (ref 1.7–2.4)

## 2021-11-29 NOTE — Progress Notes (Cosign Needed)
Pulmonary Critical Care Medicine [facility] [place]   PULMONARY CRITICAL CARE SERVICE  PROGRESS NOTE     Geraline Halberstadt  RWE:[id number]  DOB: 1986/03/24   DOA: 11/26/2021  Referring Physician: Luna Kitchens, MD  HPI: Kara [name] is a 36 y.o. female being followed for ventilator/airway/oxygen weaning Acute on Chronic Respiratory Failure.  Patient seen lying in bed, husband and mother at bedside.  Currently on ATC 28%.  Bilateral wrist restraints in place.  Trach sutures remain in place.  Continues to have a large amount of secretions present.  No acute distress, no acute overnight events.  Medications: Reviewed on Rounds  Physical Exam:  Vitals: Temp 100.1, pulse 96, respirations 27, BP 160/67, SPO2 99%  Ventilator Settings ATC 28%  General: Comfortable at this time Neck: supple Cardiovascular: no malignant arrhythmias Respiratory: Bilaterally coarse Skin: no rash seen on limited exam Musculoskeletal: No gross abnormality Psychiatric:unable to assess Neurologic:no involuntary movements         Lab Data:   Basic Metabolic Panel: Recent Labs  Lab 11/27/21 0457 11/28/21 0401 11/28/21 2057 11/29/21 0308  NA 139  --   --  138  K 3.7  --   --  3.5  CL 107  --   --  106  CO2 23  --   --  23  GLUCOSE 143*  --   --  163*  BUN 14  --   --  8  CREATININE 0.87  --   --  0.77  CALCIUM 8.3*  --   --  8.0*  MG  --  1.0* 1.8 1.7  PHOS  --  2.7  --   --     ABG: No results for input(s): "PHART", "PCO2ART", "PO2ART", "HCO3", "O2SAT" in the last 168 hours.  Liver Function Tests: Recent Labs  Lab 11/27/21 0457  AST 44*  ALT 22  ALKPHOS 278*  BILITOT 1.3*  PROT 6.1*  ALBUMIN 2.2*   No results for input(s): "LIPASE", "AMYLASE" in the last 168 hours. No results for input(s): "AMMONIA" in the last 168 hours.  CBC: Recent Labs  Lab 11/27/21 0457 11/29/21 0308  WBC 16.9* 16.4*  NEUTROABS 12.4*  --   HGB 9.1* 9.5*  HCT 28.8* 29.6*  MCV 101.8* 98.7   PLT 217 188    Cardiac Enzymes: No results for input(s): "CKTOTAL", "CKMB", "CKMBINDEX", "TROPONINI" in the last 168 hours.  BNP (last 3 results) Recent Labs    12/07/20 1043  BNP 132.9*    ProBNP (last 3 results) No results for input(s): "PROBNP" in the last 8760 hours.  Radiological Exams: No results found.  Assessment/Plan Active Problems:   Alcohol abuse, in remission   Acute on chronic respiratory failure with hypoxia (HCC)   Cardiac arrest (HCC)   Seizure disorder (HCC)   Chronic anoxic encephalopathy (HCC)   Tracheostomy status (HCC)   Acute on chronic respiratory failure hypoxia-currently on ATC 28%.  Tolerating PMV trials well.  Does continue to have a lot amount of secretions.  Continue with pulmonary toilet. Cardiac arrest-rhythm stable at this time.  Continue with telemetry. Seizure patient-no current seizures.  Continue with medical management.  Continue with seizure precautions. Anoxic encephalopathy-patient is awake and interactive.  Unable to fully evaluate how much she comprehends.  She does remain impulsive and currently in bilateral wrist restraints.  Continue every shift neurochecks. Tracheostomy-remains stable in place.  Trach sutures remain in place, sutures can be removed Wednesday.  Continue with trach care per protocol.  I have personally seen and evaluated the patient, evaluated laboratory and imaging results, formulated the assessment and plan and placed orders. The Patient requires high complexity decision making with multiple systems involvement.  Rounds were done with the Respiratory Therapy Director and Staff therapists and discussed with nursing staff also.  Allyne Gee, MD [facility] Pulmonary Critical Care Medicine Sleep Medicine

## 2021-11-30 ENCOUNTER — Other Ambulatory Visit (HOSPITAL_COMMUNITY): Payer: Self-pay

## 2021-11-30 DIAGNOSIS — G931 Anoxic brain damage, not elsewhere classified: Secondary | ICD-10-CM | POA: Diagnosis not present

## 2021-11-30 DIAGNOSIS — I469 Cardiac arrest, cause unspecified: Secondary | ICD-10-CM | POA: Diagnosis not present

## 2021-11-30 DIAGNOSIS — J9621 Acute and chronic respiratory failure with hypoxia: Secondary | ICD-10-CM | POA: Diagnosis not present

## 2021-11-30 DIAGNOSIS — F1011 Alcohol abuse, in remission: Secondary | ICD-10-CM | POA: Diagnosis not present

## 2021-11-30 LAB — URINE CULTURE: Culture: NO GROWTH

## 2021-11-30 LAB — MAGNESIUM: Magnesium: 1.6 mg/dL — ABNORMAL LOW (ref 1.7–2.4)

## 2021-11-30 NOTE — Progress Notes (Cosign Needed)
Pulmonary Critical Care Medicine [facility] [place]   PULMONARY CRITICAL CARE SERVICE  PROGRESS NOTE     Kara [name]  IRC:[id number]  DOB: 29-Sep-1985   DOA: 11/26/2021  Referring Physician: Luna Kitchens, MD  HPI: Kara [name] is a 36 y.o. female being followed for ventilator/airway/oxygen weaning Acute on Chronic Respiratory Failure.  Patient seen lying in bed, mother at bedside.  Currently on ATC 28% with PMV in place.  Trach sutures remain in place.  No acute distress, no acute overnight events.  Medications: Reviewed on Rounds  Physical Exam:  Vitals: Temp 100.7, pulse 103, respirations 36, BP 133/77, SPO2 97% Ventilator Settings ATC 28% with PMV in place  General: Comfortable at this time Neck: supple Cardiovascular: no malignant arrhythmias Respiratory: Bilaterally coarse Skin: no rash seen on limited exam Musculoskeletal: No gross abnormality Psychiatric:unable to assess Neurologic:no involuntary movements         Lab Data:   Basic Metabolic Panel: Recent Labs  Lab 11/27/21 0457 11/28/21 0401 11/28/21 2057 11/29/21 0308 11/29/21 1834  NA 139  --   --  138  --   K 3.7  --   --  3.5  --   CL 107  --   --  106  --   CO2 23  --   --  23  --   GLUCOSE 143*  --   --  163*  --   BUN 14  --   --  8  --   CREATININE 0.87  --   --  0.77  --   CALCIUM 8.3*  --   --  8.0*  --   MG  --  1.0* 1.8 1.7 1.8  PHOS  --  2.7  --   --   --     ABG: No results for input(s): "PHART", "PCO2ART", "PO2ART", "HCO3", "O2SAT" in the last 168 hours.  Liver Function Tests: Recent Labs  Lab 11/27/21 0457  AST 44*  ALT 22  ALKPHOS 278*  BILITOT 1.3*  PROT 6.1*  ALBUMIN 2.2*   No results for input(s): "LIPASE", "AMYLASE" in the last 168 hours. No results for input(s): "AMMONIA" in the last 168 hours.  CBC: Recent Labs  Lab 11/27/21 0457 11/29/21 0308  WBC 16.9* 16.4*  NEUTROABS 12.4*  --   HGB 9.1* 9.5*  HCT 28.8* 29.6*  MCV 101.8* 98.7  PLT 217  188    Cardiac Enzymes: No results for input(s): "CKTOTAL", "CKMB", "CKMBINDEX", "TROPONINI" in the last 168 hours.  BNP (last 3 results) Recent Labs    12/07/20 1043  BNP 132.9*    ProBNP (last 3 results) No results for input(s): "PROBNP" in the last 8760 hours.  Radiological Exams: DG CHEST PORT 1 VIEW  Result Date: 11/30/2021 CLINICAL DATA:  36 year old female with bilateral dependent consolidation last month following cardiac arrest. EXAM: PORTABLE CHEST 1 VIEW COMPARISON:  Portable chest 11/26/2021 and earlier. FINDINGS: Portable AP semi upright view at 0629 hours. Stable tracheostomy, right PICC line, visible enteric feeding tube. Continued somewhat low lung volumes. Left perihilar air bronchograms, scattered patchy bilateral pulmonary opacity sparing the right lung base. Ventilation has mildly improved since 11/26/2021. No pneumothorax or pleural effusion. No pulmonary edema suspected. Stable cardiac size and mediastinal contours. No acute osseous abnormality identified. Paucity of bowel gas in the upper abdomen. IMPRESSION: 1. Stable lines and tubes. 2. Patchy bilateral pulmonary opacity compatible with pneumonia has mildly improved since 11/26/2021. 3. No new cardiopulmonary abnormality identified. Electronically Signed  By: Odessa Fleming M.D.   On: 11/30/2021 07:19    Assessment/Plan Active Problems:   Alcohol abuse, in remission   Acute on chronic respiratory failure with hypoxia (HCC)   Cardiac arrest (HCC)   Seizure disorder (HCC)   Chronic anoxic encephalopathy (HCC)   Tracheostomy status (HCC)  Acute on chronic respiratory failure hypoxia-currently on ATC 28%.  Tolerating PMV trials well.  Does continue to have a lot amount of secretions.  Continue with pulmonary toilet. Cardiac arrest-rhythm stable at this time.  Continue with telemetry. Seizure patient-no current seizures.  Continue with medical management.  Continue with seizure precautions. Anoxic encephalopathy-patient  is awake and interactive.  Unable to fully evaluate how much she comprehends.  She does remain impulsive and currently in bilateral wrist restraints.  Continue every shift neurochecks. Tracheostomy-remains stable in place.  Trach sutures remain in place, sutures can be removed Wednesday.  Continue with trach care per protocol.   I have personally seen and evaluated the patient, evaluated laboratory and imaging results, formulated the assessment and plan and placed orders. The Patient requires high complexity decision making with multiple systems involvement.  Rounds were done with the Respiratory Therapy Director and Staff therapists and discussed with nursing staff also.  Yevonne Pax, MD [facility] Pulmonary Critical Care Medicine Sleep Medicine

## 2021-12-01 ENCOUNTER — Other Ambulatory Visit (HOSPITAL_COMMUNITY): Payer: Self-pay

## 2021-12-01 LAB — PHOSPHORUS: Phosphorus: 3.2 mg/dL (ref 2.5–4.6)

## 2021-12-01 LAB — MAGNESIUM: Magnesium: 1.4 mg/dL — ABNORMAL LOW (ref 1.7–2.4)

## 2021-12-01 LAB — HCG, QUANTITATIVE, PREGNANCY: hCG, Beta Chain, Quant, S: 2 m[IU]/mL (ref <5)

## 2021-12-01 MED ORDER — IOHEXOL 9 MG/ML PO SOLN: 500.0000 mL | ORAL | Status: AC

## 2021-12-01 MED ORDER — IOHEXOL 300 MG/ML  SOLN
100.0000 mL | Freq: Once | INTRAMUSCULAR | Status: AC | PRN
Start: 2021-12-01 — End: 2021-12-01
  Administered 2021-12-01: 100 mL via INTRAVENOUS

## 2021-12-01 NOTE — Progress Notes (Addendum)
Pulmonary Critical Care Medicine [facility] [place]   PULMONARY CRITICAL CARE SERVICE  PROGRESS NOTE     Erionna Strum  NOB:[id number]  DOB: 08-20-1985   DOA: 11/26/2021  Referring Physician: Luna Kitchens, MD  HPI: Kara [name] is a 36 y.o. female being followed for ventilator/airway/oxygen weaning Acute on Chronic Respiratory Failure.  Patient seen lying in bed, mother at bedside.  Currently on ATC 28% PMV in place.  Was able to wear PMV all day yesterday without issue.  Trach sutures remain in place, will be removed tomorrow.  Remains in bilateral wrist restraints this morning.  No acute distress, no acute overnight events.  Medications: Reviewed on Rounds  Physical Exam:  Vitals: Temp 100.2, pulse 102, respirations 29, BP 126/80, SPO2 99%  Ventilator Settings ATC 28% with PMV in place  General: Comfortable at this time Neck: supple Cardiovascular: no malignant arrhythmias Respiratory: Bilaterally diminished Skin: no rash seen on limited exam Musculoskeletal: No gross abnormality Psychiatric:unable to assess Neurologic:no involuntary movements         Lab Data:   Basic Metabolic Panel: Recent Labs  Lab 11/27/21 0457 11/28/21 0401 11/28/21 0401 11/28/21 2057 11/29/21 0308 11/29/21 1834 11/30/21 0817 12/01/21 0534  NA 139  --   --   --  138  --   --   --   K 3.7  --   --   --  3.5  --   --   --   CL 107  --   --   --  106  --   --   --   CO2 23  --   --   --  23  --   --   --   GLUCOSE 143*  --   --   --  163*  --   --   --   BUN 14  --   --   --  8  --   --   --   CREATININE 0.87  --   --   --  0.77  --   --   --   CALCIUM 8.3*  --   --   --  8.0*  --   --   --   MG  --  1.0*   < > 1.8 1.7 1.8 1.6* 1.4*  PHOS  --  2.7  --   --   --   --   --  3.2   < > = values in this interval not displayed.    ABG: No results for input(s): "PHART", "PCO2ART", "PO2ART", "HCO3", "O2SAT" in the last 168 hours.  Liver Function Tests: Recent Labs  Lab  11/27/21 0457  AST 44*  ALT 22  ALKPHOS 278*  BILITOT 1.3*  PROT 6.1*  ALBUMIN 2.2*   No results for input(s): "LIPASE", "AMYLASE" in the last 168 hours. No results for input(s): "AMMONIA" in the last 168 hours.  CBC: Recent Labs  Lab 11/27/21 0457 11/29/21 0308  WBC 16.9* 16.4*  NEUTROABS 12.4*  --   HGB 9.1* 9.5*  HCT 28.8* 29.6*  MCV 101.8* 98.7  PLT 217 188    Cardiac Enzymes: No results for input(s): "CKTOTAL", "CKMB", "CKMBINDEX", "TROPONINI" in the last 168 hours.  BNP (last 3 results) Recent Labs    12/07/20 1043  BNP 132.9*    ProBNP (last 3 results) No results for input(s): "PROBNP" in the last 8760 hours.  Radiological Exams: DG CHEST PORT 1 VIEW  Result Date: 11/30/2021 CLINICAL  DATA:  [id number] EXAM: PORTABLE CHEST 1 VIEW COMPARISON:  11/30/2021 FINDINGS: Tracheostomy tube and feeding tube unchanged. RIGHT central venous line unchanged. Stable cardiac silhouette. There is diffuse bilateral airspace disease. More dense airspace disease in the lingula. No pleural fluid. No pneumothorax. IMPRESSION: Fine diffuse airspace disease with differential including pulmonary edema versus multifocal pneumonia. Density in the lingula favors pneumonia. Electronically Signed   By: Genevive Bi M.D.   On: 11/30/2021 14:57   DG CHEST PORT 1 VIEW  Result Date: 11/30/2021 CLINICAL DATA:  36 year old female with bilateral dependent consolidation last month following cardiac arrest. EXAM: PORTABLE CHEST 1 VIEW COMPARISON:  Portable chest 11/26/2021 and earlier. FINDINGS: Portable AP semi upright view at 0629 hours. Stable tracheostomy, right PICC line, visible enteric feeding tube. Continued somewhat low lung volumes. Left perihilar air bronchograms, scattered patchy bilateral pulmonary opacity sparing the right lung base. Ventilation has mildly improved since 11/26/2021. No pneumothorax or pleural effusion. No pulmonary edema suspected. Stable cardiac size and  mediastinal contours. No acute osseous abnormality identified. Paucity of bowel gas in the upper abdomen. IMPRESSION: 1. Stable lines and tubes. 2. Patchy bilateral pulmonary opacity compatible with pneumonia has mildly improved since 11/26/2021. 3. No new cardiopulmonary abnormality identified. Electronically Signed   By: Odessa Fleming M.D.   On: 11/30/2021 07:19    Assessment/Plan Active Problems:   Alcohol abuse, in remission   Acute on chronic respiratory failure with hypoxia (HCC)   Cardiac arrest (HCC)   Seizure disorder (HCC)   Chronic anoxic encephalopathy (HCC)   Tracheostomy status (HCC)  Acute on chronic respiratory failure hypoxia-currently on ATC 28%.  Tolerating PMV trials well.  Does continue to have a lot amount of secretions.  Continue with pulmonary toilet. Cardiac arrest-rhythm stable at this time.  Continue with telemetry. Seizure patient-no current seizures.  Continue with medical management.  Continue with seizure precautions. Anoxic encephalopathy-patient is awake and interactive.  Unable to fully evaluate how much she comprehends.  She does remain impulsive and currently in bilateral wrist restraints.  Continue every shift neurochecks. Tracheostomy-remains stable in place.  Trach sutures remain in place, sutures can be removed Wednesday.  Continue with trach care per protocol.   I have personally seen and evaluated the patient, evaluated laboratory and imaging results, formulated the assessment and plan and placed orders. The Patient requires high complexity decision making with multiple systems involvement.  Rounds were done with the Respiratory Therapy Director and Staff therapists and discussed with nursing staff also.  Yevonne Pax, MD [facility] Pulmonary Critical Care Medicine Sleep Medicine

## 2021-12-02 LAB — CBC
HCT: 26.6 % — ABNORMAL LOW (ref 36.0–46.0)
Hemoglobin: 8.5 g/dL — ABNORMAL LOW (ref 12.0–15.0)
MCH: 31.8 pg (ref 26.0–34.0)
MCHC: 32 g/dL (ref 30.0–36.0)
MCV: 99.6 fL (ref 80.0–100.0)
Platelets: 167 10*3/uL (ref 150–400)
RBC: 2.67 MIL/uL — ABNORMAL LOW (ref 3.87–5.11)
RDW: 13.5 % (ref 11.5–15.5)
WBC: 11.5 10*3/uL — ABNORMAL HIGH (ref 4.0–10.5)
nRBC: 0 % (ref 0.0–0.2)

## 2021-12-02 LAB — COMPREHENSIVE METABOLIC PANEL
ALT: 14 U/L (ref 0–44)
AST: 37 U/L (ref 15–41)
Albumin: 2.2 g/dL — ABNORMAL LOW (ref 3.5–5.0)
Alkaline Phosphatase: 224 U/L — ABNORMAL HIGH (ref 38–126)
Anion gap: 9 (ref 5–15)
BUN: 9 mg/dL (ref 6–20)
CO2: 22 mmol/L (ref 22–32)
Calcium: 8.3 mg/dL — ABNORMAL LOW (ref 8.9–10.3)
Chloride: 104 mmol/L (ref 98–111)
Creatinine, Ser: 0.71 mg/dL (ref 0.44–1.00)
GFR, Estimated: >60 mL/min (ref >60)
Glucose, Bld: 244 mg/dL — ABNORMAL HIGH (ref 70–99)
Potassium: 3.8 mmol/L (ref 3.5–5.1)
Sodium: 135 mmol/L (ref 135–145)
Total Bilirubin: 1 mg/dL (ref 0.3–1.2)
Total Protein: 6.5 g/dL (ref 6.5–8.1)

## 2021-12-02 LAB — CULTURE, RESPIRATORY W GRAM STAIN

## 2021-12-02 LAB — MAGNESIUM: Magnesium: 1.6 mg/dL — ABNORMAL LOW (ref 1.7–2.4)

## 2021-12-02 NOTE — Progress Notes (Addendum)
Pulmonary Critical Care Medicine [facility] [place]   PULMONARY CRITICAL CARE SERVICE  PROGRESS NOTE     Kara [name]  ION:[id number]  DOB: 09-10-1985   DOA: 11/26/2021  Referring Physician: Luna Kitchens, MD  HPI: Kara [name] is a 36 y.o. female being followed for ventilator/airway/oxygen weaning Acute on Chronic Respiratory Failure.  Patient seen lying in bed, mother at bedside.  Currently remains on ATC 28% with PMV in place.  We have placed an order for trach suture removal today. Order placed for Acapella and incentive spirometer and start capping trials as tolerated today.  No acute distress, no acute overnight events.  Medications: Reviewed on Rounds  Physical Exam:  Vitals: -  Ventilator Settings ATC 28% with PMV in place  General: Comfortable at this time Neck: supple Cardiovascular: no malignant arrhythmias Respiratory: Bilaterally diminished Skin: no rash seen on limited exam Musculoskeletal: No gross abnormality Psychiatric:unable to assess Neurologic:no involuntary movements         Lab Data:   Basic Metabolic Panel: Recent Labs  Lab 11/27/21 0457 11/28/21 0401 11/28/21 2057 11/29/21 0308 11/29/21 1834 11/30/21 0817 12/01/21 0534 12/02/21 0656  NA 139  --   --  138  --   --   --  135  K 3.7  --   --  3.5  --   --   --  3.8  CL 107  --   --  106  --   --   --  104  CO2 23  --   --  23  --   --   --  22  GLUCOSE 143*  --   --  163*  --   --   --  244*  BUN 14  --   --  8  --   --   --  9  CREATININE 0.87  --   --  0.77  --   --   --  0.71  CALCIUM 8.3*  --   --  8.0*  --   --   --  8.3*  MG  --  1.0*   < > 1.7 1.8 1.6* 1.4* 1.6*  PHOS  --  2.7  --   --   --   --  3.2  --    < > = values in this interval not displayed.    ABG: No results for input(s): "PHART", "PCO2ART", "PO2ART", "HCO3", "O2SAT" in the last 168 hours.  Liver Function Tests: Recent Labs  Lab 11/27/21 0457 12/02/21 0656  AST 44* 37  ALT 22 14  ALKPHOS  278* 224*  BILITOT 1.3* 1.0  PROT 6.1* 6.5  ALBUMIN 2.2* 2.2*   No results for input(s): "LIPASE", "AMYLASE" in the last 168 hours. No results for input(s): "AMMONIA" in the last 168 hours.  CBC: Recent Labs  Lab 11/27/21 0457 11/29/21 0308 12/02/21 0656  WBC 16.9* 16.4* 11.5*  NEUTROABS 12.4*  --   --   HGB 9.1* 9.5* 8.5*  HCT 28.8* 29.6* 26.6*  MCV 101.8* 98.7 99.6  PLT 217 188 167    Cardiac Enzymes: No results for input(s): "CKTOTAL", "CKMB", "CKMBINDEX", "TROPONINI" in the last 168 hours.  BNP (last 3 results) Recent Labs    12/07/20 1043  BNP 132.9*    ProBNP (last 3 results) No results for input(s): "PROBNP" in the last 8760 hours.  Radiological Exams: CT ABDOMEN W CONTRAST  Result Date: 12/01/2021 CLINICAL DATA:  Suspected pneumonia.  Pancreatitis. EXAM: CT CHEST, ABDOMEN  WITH CONTRAST TECHNIQUE: Multidetector CT imaging of the chest, abdomen and pelvis was performed following the standard protocol during bolus administration of intravenous contrast. RADIATION DOSE REDUCTION: This exam was performed according to the departmental dose-optimization program which includes automated exposure control, adjustment of the mA and/or kV according to patient size and/or use of iterative reconstruction technique. CONTRAST:  OMNIPAQUE IOHEXOL 300 MG/ML  SOLN COMPARISON:  CT abdomen and pelvis 08/05/2020. CT of the chest 10/06/2016. CT of the chest 11/13/2021. FINDINGS: CT CHEST FINDINGS Cardiovascular: Heart is mildly enlarged. Aorta is normal in size. There is no pericardial effusion. Right-sided central venous catheter tip ends in the distal SVC. Mediastinum/Nodes: Enteric tube is seen throughout the esophagus. The esophagus is nondilated. Visualized thyroid gland within normal limits. Tracheostomy in place in the upper trachea. There are enlarged right hilar lymph nodes measuring up to 12 mm short axis. Enlarged left hilar lymph nodes measuring up to 16 mm short axis.  Enlarged AP window lymph nodes measure 12 mm short axis. There are nonenlarged prevascular and paratracheal lymph nodes. Nonenlarged subcarinal lymph nodes are present as well. Lungs/Pleura: There are multifocal patchy ground-glass and patchy airspace opacities throughout both lungs. Some areas are slightly nodular in configuration. Airspace opacities are most significant in the bilateral upper lobes and left lower lobe. There is some dependent atelectasis in the bilateral lower lobes. Trace pleural effusions are present. There is no evidence for pneumothorax. Trachea and central airways are patent. Musculoskeletal: There are acute nondisplaced anterior right third through seventh rib fractures. There are acute nondisplaced left second through seventh rib fractures. Healed inferior sternal fracture present. CT ABDOMEN PELVIS FINDINGS Hepatobiliary: Liver is enlarged, unchanged. No focal liver lesions are seen. Gallbladder surgically absent. There is no biliary ductal dilatation. Pancreas: Atrophic. No acute findings. No evidence for cyst or abscess formation. No ductal dilatation. Spleen: Moderately enlarged, increased from prior. Adrenals/Urinary Tract: Kidneys and adrenal glands are within normal limits. Stomach/Bowel: Visualized bowel loops are nondilated. Enteric tube tip is in jejunal loops. No free air. Vascular/Lymphatic: Varices are seen throughout the upper abdomen. These are similar to prior. Visualized aorta and IVC are normal in size. No enlarged lymph nodes are seen. Other: Small amount of free fluid in the abdomen. No abdominal wall hernia. Mild body wall edema. Musculoskeletal: No acute or significant osseous findings. IMPRESSION: 1. Bilateral multifocal ground-glass and airspace opacities throughout both lungs most significant in the upper lobes and left lower lobe compatible with multifocal pneumonia. Superimposed edema or hemorrhage not excluded. 2. Bilateral hilar and mediastinal lymphadenopathy.  3. Trace bilateral pleural effusions. 4. Multiple acute anterior rib fractures. 5. Mild cardiomegaly. 6. Hepatosplenomegaly and varices are again seen in the upper abdomen. The spleen has increased in size. 7. Enteric tube tip is in the jejunum. 8. Small volume ascites and body wall edema. 9. Pancreas is within normal limits. Electronically Signed   By: Darliss Cheney M.D.   On: 12/01/2021 23:56   CT CHEST W CONTRAST  Result Date: 12/01/2021 CLINICAL DATA:  Suspected pneumonia.  Pancreatitis. EXAM: CT CHEST, ABDOMEN WITH CONTRAST TECHNIQUE: Multidetector CT imaging of the chest, abdomen and pelvis was performed following the standard protocol during bolus administration of intravenous contrast. RADIATION DOSE REDUCTION: This exam was performed according to the departmental dose-optimization program which includes automated exposure control, adjustment of the mA and/or kV according to patient size and/or use of iterative reconstruction technique. CONTRAST:  OMNIPAQUE IOHEXOL 300 MG/ML  SOLN COMPARISON:  CT abdomen and pelvis  08/05/2020. CT of the chest 10/06/2016. CT of the chest 11/13/2021. FINDINGS: CT CHEST FINDINGS Cardiovascular: Heart is mildly enlarged. Aorta is normal in size. There is no pericardial effusion. Right-sided central venous catheter tip ends in the distal SVC. Mediastinum/Nodes: Enteric tube is seen throughout the esophagus. The esophagus is nondilated. Visualized thyroid gland within normal limits. Tracheostomy in place in the upper trachea. There are enlarged right hilar lymph nodes measuring up to 12 mm short axis. Enlarged left hilar lymph nodes measuring up to 16 mm short axis. Enlarged AP window lymph nodes measure 12 mm short axis. There are nonenlarged prevascular and paratracheal lymph nodes. Nonenlarged subcarinal lymph nodes are present as well. Lungs/Pleura: There are multifocal patchy ground-glass and patchy airspace opacities throughout both lungs. Some areas are slightly  nodular in configuration. Airspace opacities are most significant in the bilateral upper lobes and left lower lobe. There is some dependent atelectasis in the bilateral lower lobes. Trace pleural effusions are present. There is no evidence for pneumothorax. Trachea and central airways are patent. Musculoskeletal: There are acute nondisplaced anterior right third through seventh rib fractures. There are acute nondisplaced left second through seventh rib fractures. Healed inferior sternal fracture present. CT ABDOMEN PELVIS FINDINGS Hepatobiliary: Liver is enlarged, unchanged. No focal liver lesions are seen. Gallbladder surgically absent. There is no biliary ductal dilatation. Pancreas: Atrophic. No acute findings. No evidence for cyst or abscess formation. No ductal dilatation. Spleen: Moderately enlarged, increased from prior. Adrenals/Urinary Tract: Kidneys and adrenal glands are within normal limits. Stomach/Bowel: Visualized bowel loops are nondilated. Enteric tube tip is in jejunal loops. No free air. Vascular/Lymphatic: Varices are seen throughout the upper abdomen. These are similar to prior. Visualized aorta and IVC are normal in size. No enlarged lymph nodes are seen. Other: Small amount of free fluid in the abdomen. No abdominal wall hernia. Mild body wall edema. Musculoskeletal: No acute or significant osseous findings. IMPRESSION: 1. Bilateral multifocal ground-glass and airspace opacities throughout both lungs most significant in the upper lobes and left lower lobe compatible with multifocal pneumonia. Superimposed edema or hemorrhage not excluded. 2. Bilateral hilar and mediastinal lymphadenopathy. 3. Trace bilateral pleural effusions. 4. Multiple acute anterior rib fractures. 5. Mild cardiomegaly. 6. Hepatosplenomegaly and varices are again seen in the upper abdomen. The spleen has increased in size. 7. Enteric tube tip is in the jejunum. 8. Small volume ascites and body wall edema. 9. Pancreas is  within normal limits. Electronically Signed   By: Darliss Cheney M.D.   On: 12/01/2021 23:56    Assessment/Plan Active Problems:   Alcohol abuse, in remission   Acute on chronic respiratory failure with hypoxia (HCC)   Cardiac arrest (HCC)   Seizure disorder (HCC)   Chronic anoxic encephalopathy (HCC)   Tracheostomy status (HCC)  Acute on chronic respiratory failure hypoxia-currently on ATC 28%.  Tolerating PMV trials well.  Does continue to have a lot amount of secretions.  Acapella and incentive spirometry ordered today.  We will move forward with capping trials as tolerated.  Continue with pulmonary toilet. Cardiac arrest-rhythm stable at this time.  Continue with telemetry. Seizure patient-no current seizures.  Continue with medical management.  Continue with seizure precautions. Anoxic encephalopathy-patient is awake and interactive.  Unable to fully evaluate how much she comprehends.  She does remain impulsive and currently in bilateral wrist restraints.  Continue every shift neurochecks. Tracheostomy-remains stable in place.  Trach sutures ordered to be removed today.  Continue with trach care per protocol.  I have personally seen and evaluated the patient, evaluated laboratory and imaging results, formulated the assessment and plan and placed orders. The Patient requires high complexity decision making with multiple systems involvement.  Rounds were done with the Respiratory Therapy Director and Staff therapists and discussed with nursing staff also.  Allyne Gee, MD [facility] Pulmonary Critical Care Medicine Sleep Medicine

## 2021-12-03 ENCOUNTER — Other Ambulatory Visit (HOSPITAL_COMMUNITY): Payer: Self-pay

## 2021-12-03 NOTE — Progress Notes (Signed)
Pulmonary Critical Care Medicine [facility] [place]   PULMONARY CRITICAL CARE SERVICE  PROGRESS NOTE     Annie Saephan  TOI:[id number]  DOB: Nov 17, 1985   DOA: 11/26/2021  Referring Physician: Luna Kitchens, MD  HPI: Kara [name] is a 36 y.o. female being followed for ventilator/airway/oxygen weaning Acute on Chronic Respiratory Failure.  Patient is on 2 L capping doing very well  Medications: Reviewed on Rounds  Physical Exam:  Vitals: Temperature 97.2 pulse 86 respiratory rate 30 blood pressure is 122/71 saturations 98%  Ventilator Settings capping on 2 L of oxygen  General: Comfortable at this time Neck: supple Cardiovascular: no malignant arrhythmias Respiratory: Scattered rhonchi very coarse breath sounds Skin: no rash seen on limited exam Musculoskeletal: No gross abnormality Psychiatric:unable to assess Neurologic:no involuntary movements         Lab Data:   Basic Metabolic Panel: Recent Labs  Lab 11/27/21 0457 11/28/21 0401 11/28/21 2057 11/29/21 0308 11/29/21 1834 11/30/21 0817 12/01/21 0534 12/02/21 0656  NA 139  --   --  138  --   --   --  135  K 3.7  --   --  3.5  --   --   --  3.8  CL 107  --   --  106  --   --   --  104  CO2 23  --   --  23  --   --   --  22  GLUCOSE 143*  --   --  163*  --   --   --  244*  BUN 14  --   --  8  --   --   --  9  CREATININE 0.87  --   --  0.77  --   --   --  0.71  CALCIUM 8.3*  --   --  8.0*  --   --   --  8.3*  MG  --  1.0*   < > 1.7 1.8 1.6* 1.4* 1.6*  PHOS  --  2.7  --   --   --   --  3.2  --    < > = values in this interval not displayed.    ABG: No results for input(s): "PHART", "PCO2ART", "PO2ART", "HCO3", "O2SAT" in the last 168 hours.  Liver Function Tests: Recent Labs  Lab 11/27/21 0457 12/02/21 0656  AST 44* 37  ALT 22 14  ALKPHOS 278* 224*  BILITOT 1.3* 1.0  PROT 6.1* 6.5  ALBUMIN 2.2* 2.2*   No results for input(s): "LIPASE", "AMYLASE" in the last 168 hours. No results  for input(s): "AMMONIA" in the last 168 hours.  CBC: Recent Labs  Lab 11/27/21 0457 11/29/21 0308 12/02/21 0656  WBC 16.9* 16.4* 11.5*  NEUTROABS 12.4*  --   --   HGB 9.1* 9.5* 8.5*  HCT 28.8* 29.6* 26.6*  MCV 101.8* 98.7 99.6  PLT 217 188 167    Cardiac Enzymes: No results for input(s): "CKTOTAL", "CKMB", "CKMBINDEX", "TROPONINI" in the last 168 hours.  BNP (last 3 results) Recent Labs    12/07/20 1043  BNP 132.9*    ProBNP (last 3 results) No results for input(s): "PROBNP" in the last 8760 hours.  Radiological Exams: CT ABDOMEN W CONTRAST  Result Date: 12/01/2021 CLINICAL DATA:  Suspected pneumonia.  Pancreatitis. EXAM: CT CHEST, ABDOMEN WITH CONTRAST TECHNIQUE: Multidetector CT imaging of the chest, abdomen and pelvis was performed following the standard protocol during bolus administration of intravenous contrast. RADIATION DOSE  REDUCTION: This exam was performed according to the departmental dose-optimization program which includes automated exposure control, adjustment of the mA and/or kV according to patient size and/or use of iterative reconstruction technique. CONTRAST:  OMNIPAQUE IOHEXOL 300 MG/ML  SOLN COMPARISON:  CT abdomen and pelvis 08/05/2020. CT of the chest 10/06/2016. CT of the chest 11/13/2021. FINDINGS: CT CHEST FINDINGS Cardiovascular: Heart is mildly enlarged. Aorta is normal in size. There is no pericardial effusion. Right-sided central venous catheter tip ends in the distal SVC. Mediastinum/Nodes: Enteric tube is seen throughout the esophagus. The esophagus is nondilated. Visualized thyroid gland within normal limits. Tracheostomy in place in the upper trachea. There are enlarged right hilar lymph nodes measuring up to 12 mm short axis. Enlarged left hilar lymph nodes measuring up to 16 mm short axis. Enlarged AP window lymph nodes measure 12 mm short axis. There are nonenlarged prevascular and paratracheal lymph nodes. Nonenlarged subcarinal lymph nodes  are present as well. Lungs/Pleura: There are multifocal patchy ground-glass and patchy airspace opacities throughout both lungs. Some areas are slightly nodular in configuration. Airspace opacities are most significant in the bilateral upper lobes and left lower lobe. There is some dependent atelectasis in the bilateral lower lobes. Trace pleural effusions are present. There is no evidence for pneumothorax. Trachea and central airways are patent. Musculoskeletal: There are acute nondisplaced anterior right third through seventh rib fractures. There are acute nondisplaced left second through seventh rib fractures. Healed inferior sternal fracture present. CT ABDOMEN PELVIS FINDINGS Hepatobiliary: Liver is enlarged, unchanged. No focal liver lesions are seen. Gallbladder surgically absent. There is no biliary ductal dilatation. Pancreas: Atrophic. No acute findings. No evidence for cyst or abscess formation. No ductal dilatation. Spleen: Moderately enlarged, increased from prior. Adrenals/Urinary Tract: Kidneys and adrenal glands are within normal limits. Stomach/Bowel: Visualized bowel loops are nondilated. Enteric tube tip is in jejunal loops. No free air. Vascular/Lymphatic: Varices are seen throughout the upper abdomen. These are similar to prior. Visualized aorta and IVC are normal in size. No enlarged lymph nodes are seen. Other: Small amount of free fluid in the abdomen. No abdominal wall hernia. Mild body wall edema. Musculoskeletal: No acute or significant osseous findings. IMPRESSION: 1. Bilateral multifocal ground-glass and airspace opacities throughout both lungs most significant in the upper lobes and left lower lobe compatible with multifocal pneumonia. Superimposed edema or hemorrhage not excluded. 2. Bilateral hilar and mediastinal lymphadenopathy. 3. Trace bilateral pleural effusions. 4. Multiple acute anterior rib fractures. 5. Mild cardiomegaly. 6. Hepatosplenomegaly and varices are again seen in  the upper abdomen. The spleen has increased in size. 7. Enteric tube tip is in the jejunum. 8. Small volume ascites and body wall edema. 9. Pancreas is within normal limits. Electronically Signed   By: Darliss Cheney M.D.   On: 12/01/2021 23:56   CT CHEST W CONTRAST  Result Date: 12/01/2021 CLINICAL DATA:  Suspected pneumonia.  Pancreatitis. EXAM: CT CHEST, ABDOMEN WITH CONTRAST TECHNIQUE: Multidetector CT imaging of the chest, abdomen and pelvis was performed following the standard protocol during bolus administration of intravenous contrast. RADIATION DOSE REDUCTION: This exam was performed according to the departmental dose-optimization program which includes automated exposure control, adjustment of the mA and/or kV according to patient size and/or use of iterative reconstruction technique. CONTRAST:  OMNIPAQUE IOHEXOL 300 MG/ML  SOLN COMPARISON:  CT abdomen and pelvis 08/05/2020. CT of the chest 10/06/2016. CT of the chest 11/13/2021. FINDINGS: CT CHEST FINDINGS Cardiovascular: Heart is mildly enlarged. Aorta is normal in size. There  is no pericardial effusion. Right-sided central venous catheter tip ends in the distal SVC. Mediastinum/Nodes: Enteric tube is seen throughout the esophagus. The esophagus is nondilated. Visualized thyroid gland within normal limits. Tracheostomy in place in the upper trachea. There are enlarged right hilar lymph nodes measuring up to 12 mm short axis. Enlarged left hilar lymph nodes measuring up to 16 mm short axis. Enlarged AP window lymph nodes measure 12 mm short axis. There are nonenlarged prevascular and paratracheal lymph nodes. Nonenlarged subcarinal lymph nodes are present as well. Lungs/Pleura: There are multifocal patchy ground-glass and patchy airspace opacities throughout both lungs. Some areas are slightly nodular in configuration. Airspace opacities are most significant in the bilateral upper lobes and left lower lobe. There is some dependent atelectasis in  the bilateral lower lobes. Trace pleural effusions are present. There is no evidence for pneumothorax. Trachea and central airways are patent. Musculoskeletal: There are acute nondisplaced anterior right third through seventh rib fractures. There are acute nondisplaced left second through seventh rib fractures. Healed inferior sternal fracture present. CT ABDOMEN PELVIS FINDINGS Hepatobiliary: Liver is enlarged, unchanged. No focal liver lesions are seen. Gallbladder surgically absent. There is no biliary ductal dilatation. Pancreas: Atrophic. No acute findings. No evidence for cyst or abscess formation. No ductal dilatation. Spleen: Moderately enlarged, increased from prior. Adrenals/Urinary Tract: Kidneys and adrenal glands are within normal limits. Stomach/Bowel: Visualized bowel loops are nondilated. Enteric tube tip is in jejunal loops. No free air. Vascular/Lymphatic: Varices are seen throughout the upper abdomen. These are similar to prior. Visualized aorta and IVC are normal in size. No enlarged lymph nodes are seen. Other: Small amount of free fluid in the abdomen. No abdominal wall hernia. Mild body wall edema. Musculoskeletal: No acute or significant osseous findings. IMPRESSION: 1. Bilateral multifocal ground-glass and airspace opacities throughout both lungs most significant in the upper lobes and left lower lobe compatible with multifocal pneumonia. Superimposed edema or hemorrhage not excluded. 2. Bilateral hilar and mediastinal lymphadenopathy. 3. Trace bilateral pleural effusions. 4. Multiple acute anterior rib fractures. 5. Mild cardiomegaly. 6. Hepatosplenomegaly and varices are again seen in the upper abdomen. The spleen has increased in size. 7. Enteric tube tip is in the jejunum. 8. Small volume ascites and body wall edema. 9. Pancreas is within normal limits. Electronically Signed   By: Darliss Cheney M.D.   On: 12/01/2021 23:56    Assessment/Plan Active Problems:   Alcohol abuse, in  remission   Acute on chronic respiratory failure with hypoxia (HCC)   Cardiac arrest (HCC)   Seizure disorder (HCC)   Chronic anoxic encephalopathy (HCC)   Tracheostomy status (HCC)   Acute on chronic respiratory failure with hypoxia we will continue to proceed with capping trials patient has been tolerating quite well Cardiac arrest rhythm is stable we will continue to monitor closely Seizure disorder no sign of active seizures at this time Anoxic encephalopathy chronic no change Tracheostomy remains in place continue with supportive care   I have personally seen and evaluated the patient, evaluated laboratory and imaging results, formulated the assessment and plan and placed orders. The Patient requires high complexity decision making with multiple systems involvement.  Rounds were done with the Respiratory Therapy Director and Staff therapists and discussed with nursing staff also.  Yevonne Pax, MD [facility] Pulmonary Critical Care Medicine Sleep Medicine

## 2021-12-04 LAB — VANCOMYCIN, TROUGH: Vancomycin Tr: 28 ug/mL (ref 15–20)

## 2021-12-04 LAB — CULTURE, BLOOD (ROUTINE X 2)
Culture: NO GROWTH
Culture: NO GROWTH
Special Requests: ADEQUATE
Special Requests: ADEQUATE

## 2021-12-04 LAB — MAGNESIUM: Magnesium: 1.8 mg/dL (ref 1.7–2.4)

## 2021-12-04 LAB — PHOSPHORUS: Phosphorus: 4.5 mg/dL (ref 2.5–4.6)

## 2021-12-04 NOTE — Progress Notes (Signed)
Pulmonary Critical Care Medicine [facility] [place]   PULMONARY CRITICAL CARE SERVICE  PROGRESS NOTE     Kayli Beal  ACZ:[id number]  DOB: [birth year]   DOA: 11/26/2021  Referring Physician: Luna Kitchens, MD  HPI: Kyonna Frier is a 36 y.o. female being followed for ventilator/airway/oxygen weaning Acute on Chronic Respiratory Failure. ***  Medications: Reviewed on Rounds  Physical Exam:  Vitals: ***  Ventilator Settings ***  General: Comfortable at this time Neck: supple Cardiovascular: no malignant arrhythmias Respiratory: *** Skin: no rash seen on limited exam Musculoskeletal: No gross abnormality Psychiatric:unable to assess Neurologic:no involuntary movements         Lab Data:   Basic Metabolic Panel: Recent Labs  Lab 11/28/21 0401 11/28/21 2057 11/29/21 0308 11/29/21 1834 11/30/21 0817 12/01/21 0534 12/02/21 0656 12/04/21 0613  NA  --   --  138  --   --   --  135  --   K  --   --  3.5  --   --   --  3.8  --   CL  --   --  106  --   --   --  104  --   CO2  --   --  23  --   --   --  22  --   GLUCOSE  --   --  163*  --   --   --  244*  --   BUN  --   --  8  --   --   --  9  --   CREATININE  --   --  0.77  --   --   --  0.71  --   CALCIUM  --   --  8.0*  --   --   --  8.3*  --   MG 1.0*   < > 1.7 1.8 1.6* 1.4* 1.6* 1.8  PHOS 2.7  --   --   --   --  3.2  --  4.5   < > = values in this interval not displayed.    ABG: No results for input(s): "PHART", "PCO2ART", "PO2ART", "HCO3", "O2SAT" in the last 168 hours.  Liver Function Tests: Recent Labs  Lab 12/02/21 0656  AST 37  ALT 14  ALKPHOS 224*  BILITOT 1.0  PROT 6.5  ALBUMIN 2.2*   No results for input(s): "LIPASE", "AMYLASE" in the last 168 hours. No results for input(s): "AMMONIA" in the last 168 hours.  CBC: Recent Labs  Lab 11/29/21 0308 12/02/21 0656  WBC 16.4* 11.5*  HGB 9.5* 8.5*  HCT 29.6* 26.6*  MCV 98.7 99.6  PLT 188 167    Cardiac Enzymes: No results  for input(s): "CKTOTAL", "CKMB", "CKMBINDEX", "TROPONINI" in the last 168 hours.  BNP (last 3 results) Recent Labs    12/07/20 1043  BNP 132.9*    ProBNP (last 3 results) No results for input(s): "PROBNP" in the last 8760 hours.  Radiological Exams: No results found.  Assessment/Plan Active Problems:   Alcohol abuse, in remission   Acute on chronic respiratory failure with hypoxia (HCC)   Cardiac arrest (HCC)   Seizure disorder (HCC)   Chronic anoxic encephalopathy (HCC)   Tracheostomy status (HCC)   *** *** *** *** ***   I have personally seen and evaluated the patient, evaluated laboratory and imaging results, formulated the assessment and plan and placed orders. The Patient requires high complexity decision making with multiple systems involvement.  Rounds were  done with the Respiratory Therapy Director and Staff therapists and discussed with nursing staff also.  Allyne Gee, MD [facility] Pulmonary Critical Care Medicine Sleep Medicine

## 2021-12-05 LAB — CBC
HCT: 28.4 % — ABNORMAL LOW (ref 36.0–46.0)
Hemoglobin: 9.5 g/dL — ABNORMAL LOW (ref 12.0–15.0)
MCH: 32.4 pg (ref 26.0–34.0)
MCHC: 33.5 g/dL (ref 30.0–36.0)
MCV: 96.9 fL (ref 80.0–100.0)
Platelets: 238 10*3/uL (ref 150–400)
RBC: 2.93 MIL/uL — ABNORMAL LOW (ref 3.87–5.11)
RDW: 13.7 % (ref 11.5–15.5)
WBC: 13.3 10*3/uL — ABNORMAL HIGH (ref 4.0–10.5)
nRBC: 0 % (ref 0.0–0.2)

## 2021-12-05 LAB — TRIGLYCERIDES: Triglycerides: 112 mg/dL (ref <150)

## 2021-12-05 LAB — VANCOMYCIN, TROUGH: Vancomycin Tr: 8 ug/mL — ABNORMAL LOW (ref 15–20)

## 2021-12-06 ENCOUNTER — Other Ambulatory Visit (HOSPITAL_COMMUNITY): Payer: Self-pay

## 2021-12-06 LAB — VANCOMYCIN, TROUGH: Vancomycin Tr: 32 ug/mL (ref 15–20)

## 2021-12-06 NOTE — Progress Notes (Signed)
Pulmonary Critical Care Medicine [facility] [place]   PULMONARY CRITICAL CARE SERVICE  PROGRESS NOTE     Kara [name]  ZGY:[id number]  DOB: [birth year]   DOA: 11/26/2021  Referring Physician: Luna Kitchens, MD  HPI: Kara [name] is a 36 y.o. female being followed for ventilator/airway/oxygen weaning Acute on Chronic Respiratory Failure.  Patient is resting comfortably without distress has been capping ready for decannulation  Medications: Reviewed on Rounds  Physical Exam:  Vitals: Temperature is 97.0 pulse 76 respiratory 23 blood pressure is 110/72 saturations 95%  Ventilator Settings capping on room air  General: Comfortable at this time Neck: supple Cardiovascular: no malignant arrhythmias Respiratory: No rhonchi or rales are noted Skin: no rash seen on limited exam Musculoskeletal: No gross abnormality Psychiatric:unable to assess Neurologic:no involuntary movements         Lab Data:   Basic Metabolic Panel: Recent Labs  Lab 11/29/21 1834 11/30/21 0817 12/01/21 0534 12/02/21 0656 12/04/21 0613  NA  --   --   --  135  --   K  --   --   --  3.8  --   CL  --   --   --  104  --   CO2  --   --   --  22  --   GLUCOSE  --   --   --  244*  --   BUN  --   --   --  9  --   CREATININE  --   --   --  0.71  --   CALCIUM  --   --   --  8.3*  --   MG 1.8 1.6* 1.4* 1.6* 1.8  PHOS  --   --  3.2  --  4.5    ABG: No results for input(s): "PHART", "PCO2ART", "PO2ART", "HCO3", "O2SAT" in the last 168 hours.  Liver Function Tests: Recent Labs  Lab 12/02/21 0656  AST 37  ALT 14  ALKPHOS 224*  BILITOT 1.0  PROT 6.5  ALBUMIN 2.2*   No results for input(s): "LIPASE", "AMYLASE" in the last 168 hours. No results for input(s): "AMMONIA" in the last 168 hours.  CBC: Recent Labs  Lab 12/02/21 0656 12/05/21 0308  WBC 11.5* 13.3*  HGB 8.5* 9.5*  HCT 26.6* 28.4*  MCV 99.6 96.9  PLT 167 238    Cardiac Enzymes: No results for input(s): "CKTOTAL",  "CKMB", "CKMBINDEX", "TROPONINI" in the last 168 hours.  BNP (last 3 results) Recent Labs    12/07/20 1043  BNP 132.9*    ProBNP (last 3 results) No results for input(s): "PROBNP" in the last 8760 hours.  Radiological Exams: No results found.  Assessment/Plan Active Problems:   Alcohol abuse, in remission   Acute on chronic respiratory failure with hypoxia (HCC)   Cardiac arrest (HCC)   Seizure disorder (HCC)   Chronic anoxic encephalopathy (HCC)   Tracheostomy status (HCC)   Acute on chronic respiratory failure hypoxia proceed to decannulation Alcohol abuse supportive care Cardiac arrest rhythm is stable at this time Chronic encephalopathy no change Tracheostomy will be removed   I have personally seen and evaluated the patient, evaluated laboratory and imaging results, formulated the assessment and plan and placed orders. The Patient requires high complexity decision making with multiple systems involvement.  Rounds were done with the Respiratory Therapy Director and Staff therapists and discussed with nursing staff also.  Yevonne Pax, MD [facility] Pulmonary Critical Care Medicine Sleep Medicine

## 2021-12-06 NOTE — Progress Notes (Addendum)
Pulmonary Critical Care Medicine [facility] [place]   PULMONARY CRITICAL CARE SERVICE  PROGRESS NOTE     Kara [name]  [id number]:[id number]  DOB: [birth year]   DOA: 11/26/2021  Referring Physician: Luna Kitchens, MD  HPI: Kara [name] is a 36 y.o. female being followed for ventilator/airway/oxygen weaning Acute on Chronic Respiratory Failure.  Patient seen lying in bed, currently capped and on room air.  Has been capped since August 16 at 11:10 AM.  Patient continues to have a moderate cough, no secretions present.  Anticipated for decannulation on Monday.  Medications: Reviewed on Rounds  Physical Exam:  Vitals: Temp 97.1, pulse 88, respirations 22, BP 124/74, SPO2 94%  Ventilator Settings capped and on room air  General: Comfortable at this time Neck: supple Cardiovascular: no malignant arrhythmias Respiratory: Bilaterally diminished Skin: no rash seen on limited exam Musculoskeletal: No gross abnormality Psychiatric:unable to assess Neurologic:no involuntary movements         Lab Data:   Basic Metabolic Panel: Recent Labs  Lab 11/29/21 1834 11/30/21 0817 12/01/21 0534 12/02/21 0656 12/04/21 0613  NA  --   --   --  135  --   K  --   --   --  3.8  --   CL  --   --   --  104  --   CO2  --   --   --  22  --   GLUCOSE  --   --   --  244*  --   BUN  --   --   --  9  --   CREATININE  --   --   --  0.71  --   CALCIUM  --   --   --  8.3*  --   MG 1.8 1.6* 1.4* 1.6* 1.8  PHOS  --   --  3.2  --  4.5    ABG: No results for input(s): "PHART", "PCO2ART", "PO2ART", "HCO3", "O2SAT" in the last 168 hours.  Liver Function Tests: Recent Labs  Lab 12/02/21 0656  AST 37  ALT 14  ALKPHOS 224*  BILITOT 1.0  PROT 6.5  ALBUMIN 2.2*   No results for input(s): "LIPASE", "AMYLASE" in the last 168 hours. No results for input(s): "AMMONIA" in the last 168 hours.  CBC: Recent Labs  Lab 12/02/21 0656 12/05/21 0308  WBC 11.5* 13.3*  HGB 8.5* 9.5*  HCT 26.6*  28.4*  MCV 99.6 96.9  PLT 167 238    Cardiac Enzymes: No results for input(s): "CKTOTAL", "CKMB", "CKMBINDEX", "TROPONINI" in the last 168 hours.  BNP (last 3 results) Recent Labs    12/07/20 1043  BNP 132.9*    ProBNP (last 3 results) No results for input(s): "PROBNP" in the last 8760 hours.  Radiological Exams: No results found.  Assessment/Plan Active Problems:   Alcohol abuse, in remission   Acute on chronic respiratory failure with hypoxia (HCC)   Cardiac arrest (HCC)   Seizure disorder (HCC)   Chronic anoxic encephalopathy (HCC)   Tracheostomy status (HCC)  Acute on chronic respiratory failure hypoxia-currently capped and on room air since August 16.  Has a strong cough with no sputum.  Continue with pulmonary toilet. Cardiac arrest-rhythm stable at this time.  Continue with telemetry. Seizure patient-no current seizures.  Continue with medical management.  Continue with seizure precautions. Anoxic encephalopathy-patient is awake and interactive.  Unable to fully evaluate how much she comprehends.  She does remain impulsive and currently in bilateral wrist restraints.  Continue every shift neurochecks. Tracheostomy-remains stable in place.  Anticipation of decannulation on Monday.  Continue with trach care per protocol.   I have personally seen and evaluated the patient, evaluated laboratory and imaging results, formulated the assessment and plan and placed orders. The Patient requires high complexity decision making with multiple systems involvement.  Rounds were done with the Respiratory Therapy Director and Staff therapists and discussed with nursing staff also.  Yevonne Pax, MD [facility] Pulmonary Critical Care Medicine Sleep Medicine

## 2021-12-07 ENCOUNTER — Other Ambulatory Visit (HOSPITAL_COMMUNITY): Payer: Self-pay

## 2021-12-07 LAB — CBC
HCT: 31 % — ABNORMAL LOW (ref 36.0–46.0)
Hemoglobin: 10 g/dL — ABNORMAL LOW (ref 12.0–15.0)
MCH: 31.4 pg (ref 26.0–34.0)
MCHC: 32.3 g/dL (ref 30.0–36.0)
MCV: 97.5 fL (ref 80.0–100.0)
Platelets: 207 10*3/uL (ref 150–400)
RBC: 3.18 MIL/uL — ABNORMAL LOW (ref 3.87–5.11)
RDW: 13.4 % (ref 11.5–15.5)
WBC: 8.2 10*3/uL (ref 4.0–10.5)
nRBC: 0 % (ref 0.0–0.2)

## 2021-12-07 LAB — MAGNESIUM: Magnesium: 1.4 mg/dL — ABNORMAL LOW (ref 1.7–2.4)

## 2021-12-07 LAB — ECHOCARDIOGRAM COMPLETE
Area-P 1/2: 4.4 cm2
Calc EF: 60 %
S' Lateral: 3.3 cm
Single Plane A2C EF: 56.4 %
Single Plane A4C EF: 62.1 %

## 2021-12-07 LAB — PHOSPHORUS: Phosphorus: 4.5 mg/dL (ref 2.5–4.6)

## 2021-12-07 NOTE — Progress Notes (Signed)
  Echocardiogram 2D Echocardiogram has been performed.  Janalyn Harder 12/07/2021, 10:04 AM

## 2021-12-09 ENCOUNTER — Encounter (HOSPITAL_COMMUNITY): Payer: Self-pay | Admitting: Physical Medicine and Rehabilitation

## 2021-12-09 ENCOUNTER — Other Ambulatory Visit: Payer: Self-pay

## 2021-12-09 ENCOUNTER — Inpatient Hospital Stay (HOSPITAL_COMMUNITY)
Admission: RE | Admit: 2021-12-09 | Discharge: 2021-12-15 | DRG: 945 | Disposition: A | Discharge status: Home / Self Care | Payer: BC Managed Care – PPO | Source: Other Acute Inpatient Hospital | Attending: Physical Medicine and Rehabilitation | Admitting: Physical Medicine and Rehabilitation

## 2021-12-09 ENCOUNTER — Encounter (HOSPITAL_COMMUNITY): Payer: Self-pay

## 2021-12-09 DIAGNOSIS — I5032 Chronic diastolic (congestive) heart failure: Secondary | ICD-10-CM | POA: Diagnosis present

## 2021-12-09 DIAGNOSIS — F419 Anxiety disorder, unspecified: Secondary | ICD-10-CM | POA: Diagnosis present

## 2021-12-09 DIAGNOSIS — F17210 Nicotine dependence, cigarettes, uncomplicated: Secondary | ICD-10-CM | POA: Diagnosis present

## 2021-12-09 DIAGNOSIS — K760 Fatty (change of) liver, not elsewhere classified: Secondary | ICD-10-CM | POA: Diagnosis present

## 2021-12-09 DIAGNOSIS — G931 Anoxic brain damage, not elsewhere classified: Secondary | ICD-10-CM | POA: Diagnosis present

## 2021-12-09 DIAGNOSIS — Z8249 Family history of ischemic heart disease and other diseases of the circulatory system: Secondary | ICD-10-CM

## 2021-12-09 DIAGNOSIS — J4550 Severe persistent asthma, uncomplicated: Secondary | ICD-10-CM | POA: Diagnosis present

## 2021-12-09 DIAGNOSIS — G629 Polyneuropathy, unspecified: Secondary | ICD-10-CM | POA: Diagnosis present

## 2021-12-09 DIAGNOSIS — K529 Noninfective gastroenteritis and colitis, unspecified: Secondary | ICD-10-CM

## 2021-12-09 DIAGNOSIS — G8929 Other chronic pain: Secondary | ICD-10-CM | POA: Diagnosis present

## 2021-12-09 DIAGNOSIS — D62 Acute posthemorrhagic anemia: Secondary | ICD-10-CM | POA: Diagnosis not present

## 2021-12-09 DIAGNOSIS — E871 Hypo-osmolality and hyponatremia: Secondary | ICD-10-CM | POA: Diagnosis present

## 2021-12-09 DIAGNOSIS — F1021 Alcohol dependence, in remission: Secondary | ICD-10-CM | POA: Diagnosis present

## 2021-12-09 DIAGNOSIS — L509 Urticaria, unspecified: Secondary | ICD-10-CM

## 2021-12-09 DIAGNOSIS — Z86718 Personal history of other venous thrombosis and embolism: Secondary | ICD-10-CM | POA: Diagnosis not present

## 2021-12-09 DIAGNOSIS — I472 Ventricular tachycardia, unspecified: Secondary | ICD-10-CM | POA: Diagnosis not present

## 2021-12-09 DIAGNOSIS — J8283 Eosinophilic asthma: Secondary | ICD-10-CM | POA: Diagnosis present

## 2021-12-09 DIAGNOSIS — T380X5A Adverse effect of glucocorticoids and synthetic analogues, initial encounter: Secondary | ICD-10-CM | POA: Diagnosis present

## 2021-12-09 DIAGNOSIS — S2249XA Multiple fractures of ribs, unspecified side, initial encounter for closed fracture: Secondary | ICD-10-CM

## 2021-12-09 DIAGNOSIS — Z833 Family history of diabetes mellitus: Secondary | ICD-10-CM

## 2021-12-09 DIAGNOSIS — R9431 Abnormal electrocardiogram [ECG] [EKG]: Secondary | ICD-10-CM | POA: Diagnosis not present

## 2021-12-09 DIAGNOSIS — R739 Hyperglycemia, unspecified: Secondary | ICD-10-CM | POA: Diagnosis present

## 2021-12-09 DIAGNOSIS — E440 Moderate protein-calorie malnutrition: Secondary | ICD-10-CM | POA: Diagnosis present

## 2021-12-09 DIAGNOSIS — I110 Hypertensive heart disease with heart failure: Secondary | ICD-10-CM | POA: Diagnosis present

## 2021-12-09 DIAGNOSIS — R5381 Other malaise: Secondary | ICD-10-CM | POA: Diagnosis present

## 2021-12-09 DIAGNOSIS — E876 Hypokalemia: Secondary | ICD-10-CM | POA: Diagnosis not present

## 2021-12-09 DIAGNOSIS — F1011 Alcohol abuse, in remission: Secondary | ICD-10-CM | POA: Diagnosis present

## 2021-12-09 DIAGNOSIS — G40909 Epilepsy, unspecified, not intractable, without status epilepticus: Secondary | ICD-10-CM | POA: Diagnosis present

## 2021-12-09 DIAGNOSIS — K861 Other chronic pancreatitis: Secondary | ICD-10-CM | POA: Diagnosis present

## 2021-12-09 DIAGNOSIS — Z79899 Other long term (current) drug therapy: Secondary | ICD-10-CM

## 2021-12-09 DIAGNOSIS — F32A Depression, unspecified: Secondary | ICD-10-CM | POA: Diagnosis present

## 2021-12-09 DIAGNOSIS — R1010 Upper abdominal pain, unspecified: Secondary | ICD-10-CM | POA: Diagnosis present

## 2021-12-09 LAB — BASIC METABOLIC PANEL
Anion gap: 11 (ref 5–15)
BUN: 8 mg/dL (ref 6–20)
CO2: 21 mmol/L — ABNORMAL LOW (ref 22–32)
Calcium: 8.9 mg/dL (ref 8.9–10.3)
Chloride: 100 mmol/L (ref 98–111)
Creatinine, Ser: 0.75 mg/dL (ref 0.44–1.00)
GFR, Estimated: >60 mL/min (ref >60)
Glucose, Bld: 447 mg/dL — ABNORMAL HIGH (ref 70–99)
Potassium: 5 mmol/L (ref 3.5–5.1)
Sodium: 132 mmol/L — ABNORMAL LOW (ref 135–145)

## 2021-12-09 LAB — MAGNESIUM: Magnesium: 1.4 mg/dL — ABNORMAL LOW (ref 1.7–2.4)

## 2021-12-09 MED ORDER — PROCHLORPERAZINE 25 MG RE SUPP: 12.5000 mg | Freq: Four times a day (QID) | RECTAL | Status: DC | PRN

## 2021-12-09 MED ORDER — MELATONIN 5 MG PO TABS
5.0000 mg | ORAL_TABLET | Freq: Every day | ORAL | Status: DC
  Administered 2021-12-09 – 2021-12-14 (×6): 5 mg via ORAL
  Filled 2021-12-09 (×6): qty 1

## 2021-12-09 MED ORDER — PREDNISONE 5 MG (21) PO TBPK
5.0000 mg | ORAL_TABLET | ORAL | Status: AC
  Administered 2021-12-10: 5 mg via ORAL

## 2021-12-09 MED ORDER — MONTELUKAST SODIUM 10 MG PO TABS
10.0000 mg | ORAL_TABLET | Freq: Every day | ORAL | Status: DC
  Administered 2021-12-09 – 2021-12-14 (×6): 10 mg via ORAL
  Filled 2021-12-09 (×6): qty 1

## 2021-12-09 MED ORDER — SACCHAROMYCES BOULARDII 250 MG PO CAPS
250.0000 mg | ORAL_CAPSULE | Freq: Two times a day (BID) | ORAL | Status: DC
  Administered 2021-12-09 – 2021-12-15 (×12): 250 mg via ORAL
  Filled 2021-12-09 (×12): qty 1

## 2021-12-09 MED ORDER — BISACODYL 10 MG RE SUPP: 10.0000 mg | Freq: Every day | RECTAL | Status: DC | PRN

## 2021-12-09 MED ORDER — SERTRALINE HCL 100 MG PO TABS
100.0000 mg | ORAL_TABLET | Freq: Every day | ORAL | Status: DC
  Administered 2021-12-10 – 2021-12-15 (×6): 100 mg via ORAL
  Filled 2021-12-09 (×6): qty 1

## 2021-12-09 MED ORDER — ALUM & MAG HYDROXIDE-SIMETH 200-200-20 MG/5ML PO SUSP: 30.0000 mL | ORAL | Status: DC | PRN

## 2021-12-09 MED ORDER — PANTOPRAZOLE SODIUM 40 MG PO TBEC
40.0000 mg | DELAYED_RELEASE_TABLET | Freq: Every day | ORAL | Status: DC
  Administered 2021-12-09 – 2021-12-15 (×7): 40 mg via ORAL
  Filled 2021-12-09 (×7): qty 1

## 2021-12-09 MED ORDER — ATORVASTATIN CALCIUM 10 MG PO TABS
10.0000 mg | ORAL_TABLET | Freq: Every day | ORAL | Status: DC
  Administered 2021-12-09 – 2021-12-14 (×6): 10 mg via ORAL
  Filled 2021-12-09 (×6): qty 1

## 2021-12-09 MED ORDER — LIDOCAINE 5 % EX PTCH
1.0000 | MEDICATED_PATCH | CUTANEOUS | Status: DC
Start: 2021-12-09 — End: 2021-12-15
  Administered 2021-12-09 – 2021-12-14 (×6): 1 via TRANSDERMAL
  Filled 2021-12-09 (×6): qty 1

## 2021-12-09 MED ORDER — CETIRIZINE HCL 10 MG PO TABS
10.0000 mg | ORAL_TABLET | Freq: Every day | ORAL | Status: DC
  Administered 2021-12-10 – 2021-12-15 (×6): 10 mg via ORAL
  Filled 2021-12-09 (×6): qty 1

## 2021-12-09 MED ORDER — CALCIUM POLYCARBOPHIL 625 MG PO TABS
625.0000 mg | ORAL_TABLET | Freq: Two times a day (BID) | ORAL | Status: DC
  Administered 2021-12-09 – 2021-12-15 (×12): 625 mg via ORAL
  Filled 2021-12-09 (×12): qty 1

## 2021-12-09 MED ORDER — FOLIC ACID 1 MG PO TABS
1.0000 mg | ORAL_TABLET | Freq: Every day | ORAL | Status: DC
  Administered 2021-12-10 – 2021-12-15 (×6): 1 mg via ORAL
  Filled 2021-12-09 (×6): qty 1

## 2021-12-09 MED ORDER — GUAIFENESIN-DM 100-10 MG/5ML PO SYRP: 5.0000 mL | ORAL_SOLUTION | Freq: Four times a day (QID) | ORAL | Status: DC | PRN

## 2021-12-09 MED ORDER — THIAMINE MONONITRATE 100 MG PO TABS
100.0000 mg | ORAL_TABLET | Freq: Every day | ORAL | Status: DC
  Administered 2021-12-10 – 2021-12-15 (×6): 100 mg via ORAL
  Filled 2021-12-09 (×12): qty 1

## 2021-12-09 MED ORDER — POLYETHYLENE GLYCOL 3350 17 G PO PACK
17.0000 g | PACK | Freq: Every day | ORAL | Status: DC | PRN
  Administered 2021-12-13: 17 g via ORAL
  Filled 2021-12-09: qty 1

## 2021-12-09 MED ORDER — PREDNISONE 5 MG (21) PO TBPK
5.0000 mg | ORAL_TABLET | Freq: Four times a day (QID) | ORAL | Status: AC
  Administered 2021-12-12 – 2021-12-15 (×10): 5 mg via ORAL

## 2021-12-09 MED ORDER — PREDNISONE 5 MG (21) PO TBPK
10.0000 mg | ORAL_TABLET | Freq: Every evening | ORAL | Status: AC
  Administered 2021-12-10: 10 mg via ORAL

## 2021-12-09 MED ORDER — METOPROLOL TARTRATE 25 MG PO TABS
25.0000 mg | ORAL_TABLET | Freq: Two times a day (BID) | ORAL | Status: DC
Start: 2021-12-09 — End: 2021-12-15
  Administered 2021-12-09 – 2021-12-15 (×12): 25 mg via ORAL
  Filled 2021-12-09 (×12): qty 1

## 2021-12-09 MED ORDER — CLONAZEPAM 0.5 MG PO TABS
0.5000 mg | ORAL_TABLET | Freq: Two times a day (BID) | ORAL | Status: DC | PRN
  Administered 2021-12-09 – 2021-12-15 (×11): 0.5 mg via ORAL
  Filled 2021-12-09 (×11): qty 1

## 2021-12-09 MED ORDER — PROCHLORPERAZINE EDISYLATE 10 MG/2ML IJ SOLN: 5.0000 mg | Freq: Four times a day (QID) | INTRAMUSCULAR | Status: DC | PRN

## 2021-12-09 MED ORDER — NON FORMULARY: 80.0000 mg | Freq: Two times a day (BID) | Status: DC

## 2021-12-09 MED ORDER — DIPHENHYDRAMINE HCL 12.5 MG/5ML PO ELIX
12.5000 mg | ORAL_SOLUTION | Freq: Four times a day (QID) | ORAL | Status: DC | PRN
  Administered 2021-12-09 – 2021-12-14 (×6): 25 mg via ORAL
  Filled 2021-12-09 (×6): qty 10

## 2021-12-09 MED ORDER — GABAPENTIN 300 MG PO CAPS
900.0000 mg | ORAL_CAPSULE | Freq: Two times a day (BID) | ORAL | Status: DC
  Administered 2021-12-09 – 2021-12-15 (×12): 900 mg via ORAL
  Filled 2021-12-09 (×12): qty 3

## 2021-12-09 MED ORDER — PREDNISONE 5 MG (21) PO TBPK
10.0000 mg | ORAL_TABLET | Freq: Every morning | ORAL | Status: AC
  Administered 2021-12-10: 10 mg via ORAL
  Filled 2021-12-09 (×2): qty 21

## 2021-12-09 MED ORDER — LACOSAMIDE 50 MG PO TABS
100.0000 mg | ORAL_TABLET | Freq: Two times a day (BID) | ORAL | Status: DC
  Administered 2021-12-09 – 2021-12-15 (×12): 100 mg via ORAL
  Filled 2021-12-09 (×12): qty 2

## 2021-12-09 MED ORDER — FLEET ENEMA 7-19 GM/118ML RE ENEM: 1.0000 | ENEMA | Freq: Once | RECTAL | Status: DC | PRN

## 2021-12-09 MED ORDER — BUDESONIDE 0.25 MG/2ML IN SUSP
0.2500 mg | Freq: Two times a day (BID) | RESPIRATORY_TRACT | Status: DC
  Administered 2021-12-10 – 2021-12-15 (×7): 0.25 mg via RESPIRATORY_TRACT
  Filled 2021-12-09 (×12): qty 2

## 2021-12-09 MED ORDER — TRAZODONE HCL 50 MG PO TABS
25.0000 mg | ORAL_TABLET | Freq: Every evening | ORAL | Status: DC | PRN
  Administered 2021-12-09: 50 mg via ORAL
  Filled 2021-12-09: qty 1

## 2021-12-09 MED ORDER — OXYCODONE HCL 5 MG PO TABS
5.0000 mg | ORAL_TABLET | Freq: Three times a day (TID) | ORAL | Status: DC | PRN
  Administered 2021-12-09 – 2021-12-15 (×14): 5 mg via ORAL
  Filled 2021-12-09 (×14): qty 1

## 2021-12-09 MED ORDER — PROCHLORPERAZINE MALEATE 5 MG PO TABS: 5.0000 mg | ORAL_TABLET | Freq: Four times a day (QID) | ORAL | Status: DC | PRN

## 2021-12-09 MED ORDER — PREDNISONE 5 MG (21) PO TBPK
5.0000 mg | ORAL_TABLET | Freq: Three times a day (TID) | ORAL | Status: AC
  Administered 2021-12-11 (×3): 5 mg via ORAL

## 2021-12-09 MED ORDER — ENOXAPARIN SODIUM 40 MG/0.4ML IJ SOSY
40.0000 mg | PREFILLED_SYRINGE | INTRAMUSCULAR | Status: DC
  Administered 2021-12-09 – 2021-12-14 (×6): 40 mg via SUBCUTANEOUS
  Filled 2021-12-09 (×6): qty 0.4

## 2021-12-09 MED ORDER — MAGNESIUM OXIDE -MG SUPPLEMENT 400 (240 MG) MG PO TABS
400.0000 mg | ORAL_TABLET | Freq: Every day | ORAL | Status: DC
  Administered 2021-12-09 – 2021-12-10 (×2): 400 mg via ORAL
  Filled 2021-12-09 (×2): qty 1

## 2021-12-09 MED ORDER — PREDNISONE 5 MG (21) PO TBPK
10.0000 mg | ORAL_TABLET | Freq: Every evening | ORAL | Status: AC
  Administered 2021-12-11: 10 mg via ORAL

## 2021-12-09 NOTE — PMR Pre-admission (Signed)
PMR Admission Coordinator Pre-Admission Assessment   Patient: Kara [name] is an 36 y.o., female MRN: [id number] DOB: 06/27/1985 Height: 5' 7" (1.702 m) Weight: 93.9 kg   Insurance Information HMO:     PPO: yes     PCP:      IPA:      80/20:      OTHER:  PRIMARY: BCBS      Policy#: [id number]      Subscriber: Pt CM Name: Jen       Phone#: [phone]  Fax: [phone]      Pt. Approved 8/23-9/5 with update due 9/5 Pre-Cert#: [id number]       Employer:  Benefits:  Phone #:      Name:  Eff Date: 04/19/2021 - still active Deductible: $1,500 ($1,500 met) OOP Max: $5,900 ($5,900 met) CIR: 70% coverage, 30% co-insurance SNF: 70% coverage, 30% co-insurance; with a limit of 100 days/cal yr (100 remaining) Outpatient:  $0 or $72 co-pay per visit pending provider type; limited by medical necessity Home Health:  70% coverage, 30% co-insurance; limited by medical necessity DME: 70% coverage, 30% co-insurance Providers: in network  SECONDARY:       Policy#:       Phone#:    Financial Counselor:       Phone#:    The "Data Collection Information Summary" for patients in Inpatient Rehabilitation Facilities with attached "Privacy Act Statement-Health Care Records" was provided and verbally reviewed with: N/A   Emergency Contact Information Contact Information       Name Relation Home Work Mobile    Shurtz,Nathaniel Spouse [phone]   [phone]    Robbins,[name] Mother [phone]   [phone]           Current Medical History  Patient Admitting Diagnosis: Respiratory failure, CVA History of Present Illness: Pt. Is a 36 y.o. 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 EMS was started on  the field found to be in VT's multiple shocks epi and amnio about 15 minutes to ROSC, she was intubated upon arrival to [place], patient was started on epi drip cardiology was consulted. MRI of the brain showed No acute intracranial abnormality. PT. Was intubated 8/3-8/5, went into respiratory failure 8/5 and was reintubated. Pt. Underwent tracheostomy 11/22/21  Pt. Received TPN and tube feeds via coretrak during admission and was discharged to [facility] 11/26/21. While At [facility], TPN was discontinued, coretrak was removed (Pt. Resumed regular PO diet), and Pt. Was decannulated to room air 12/07/21.  Referral was sent to [place] [facility] for further physical medicine and rehabilitation.        Significant Events: 7/28 admitted to [facility] cardiac arrest 8/2 EEG stopped. MRI 8/3 extubated 8/5 reintubated 8/6 trach 8/7 doing well with PMSV. Sips and chips started.  8/9 tolerated trach collar for two days, mental status improved throughout the day and asking about dogs and work per mom   Patient's medical record from [facility] has been reviewed by the rehabilitation admission coordinator and physician.   Past Medical History         Past Medical History:  Diagnosis Date   Alcoholism in remission (HCC)      per pt in remission since 2017   Anemia       AR (allergic rhinitis)      allergist-- dr kozlow   Biliary dyskinesia     Diastolic CHF (HCC)      followd by dr p. nishan   Environmental allergies     GERD (gastroesophageal reflux disease)     Headache     History of acute pancreatitis      alcoholic pancreatitis ,  2016 and 2017   History of sepsis 06/12/2016    with CAP   History of syncope     History of thrombosis 01/29/2015    splenic vein thrombosis-- treated with coumadin--- readmitted for abd. hematoma due to coumadin treated with IR embolization of the IMA branches   Hypertension     Polyneuropathy     Prolonged QT syndrome      cardiologist-- dr  nishan (notes in epic)   Severe persistent asthma      pulmonologist-- dr dr p. mannam (notes in epic)      Has the patient had major surgery during 100 days prior to admission? Yes   Family History   family history includes Colon polyps in her mother; Diabetes in her father; Heart failure in her father, maternal grandfather, and mother.   Current Medications No current facility-administered medications for this encounter.   Patients Current Diet: Diet regular   Precautions / Restrictions Precautions: Fall     Has the patient had 2 or more falls or a fall with injury in the past year? Yes   Prior Activity Level Community (5-7x/wk): Active in the community PTA     Prior Functional Level Self Care: Did the patient need help bathing, dressing, using the toilet or eating? Independent   Indoor Mobility: Did the patient need assistance with walking from room to room (with or without device)? Independent   Stairs: Did the patient need assistance with internal or external stairs (with or without device)? Independent   Functional Cognition: Did the patient need help planning regular tasks such as shopping or remembering to take medications? Independent   Patient Information Are you of Hispanic, Latino/a,or Spanish origin?: A. No, not of Hispanic, Latino/a, or Spanish origin What is your race?: A. White Do you need or want an interpreter to communicate with a doctor or health care staff?: 0. No   Patient's Response To:  Health Literacy and Transportation Is the patient able to respond to health literacy and transportation needs?: Yes Health Literacy - How often do you need to have someone help you when you read instructions, pamphlets, or other written material from your doctor or pharmacy?: Never In the past 12 months, has lack of transportation kept you from medical appointments or from getting medications?: No In the past 12 months, has lack of transportation kept you from  meetings, work, or from getting things needed for daily living?: No   Home Assistive Devices / Equipment   Prior Device Use: Indicate devices/aids used by the patient prior to current illness, exacerbation or injury? None of the above     Prior Functional Level Current Functional Level  Bed Mobility   Independent Min assist    Transfers   Independent   Min assist    Mobility - Walk/Wheelchair   Independent   Other    Upper Body Dressing   Independent   Min assist    Lower Body Dressing   Independent   Mod assist    Grooming   Independent   Mod Independent    Eating/Drinking   Independent     Mod Independent    Toilet Transfer   Independent   Mod assist    Bladder Continence    Continent   contient    Bowel Management   Continent   continent    Stair Climbing   Independent   Other    Communication     mod I    Memory     mod I      Special Needs/ Care Considerations Skin stoma site, sacral wound, Bowel management continent, and Bladder management continent   Previous Home Environment (from acute therapy documentation) Living Arrangements: Spouse/significant other; Other (Comment)  Lives With: Spouse Available Help at Discharge: Available 24 hours/day; Family Type of Home: House Home Layout: One level Home Access: Stairs to enter Entrance Stairs-Rails: None Entrance Stairs-Number of Steps: 2 Bathroom Shower/Tub: Walk-in shower Bathroom Toilet: Handicapped height Bathroom Accessibility: Yes How Accessible: Accessible via walker; Accessible via wheelchair Home Care Services: No     Discharge Living Setting Plans for Discharge Living Setting: Patient's home; Other (Comment) Type of Home at Discharge: House Discharge Home Layout: One level Discharge Home Access: Stairs to enter Entrance Stairs-Rails: None Entrance Stairs-Number of Steps: 2 Discharge Bathroom Shower/Tub: Tub/shower unit Discharge Bathroom Toilet: Standard Discharge Bathroom  Accessibility: Yes How Accessible: Accessible via walker Does the patient have any problems obtaining your medications?: No     Social/Family/Support Systems Patient Roles: Parent; Spouse Contact Information: [phone] Anticipated Caregiver: [name] Rombbins (mother) Ability/Limitations of Caregiver: 24/7 min A Caregiver Availability: 24/7 Discharge Plan Discussed with Primary Caregiver: Yes Is Caregiver In Agreement with Plan?: Yes Does Caregiver/Family have Issues with Lodging/Transportation while Pt is in Rehab?: No     Goals Patient/Family Goal for Rehab: PT/OT/SLP Supervision Expected length of stay: 18-21 days Pt/Family Agrees to Admission and willing to participate: Yes Program Orientation Provided & Reviewed with Pt/Caregiver Including Roles  & Responsibilities: Yes     Decrease burden of Care through IP rehab admission: Not anticipated   Possible need for SNF placement upon discharge: not anticipated    Patient Condition: I have reviewed medical records from [facility] , spoken with CM, and patient. I met with patient at the bedside for inpatient rehabilitation assessment.  Patient will benefit from ongoing PT, OT, and SLP, can actively participate in 3 hours of therapy a day 5 days of the week, and can make measurable gains during the admission.  Patient will also benefit from the coordinated team approach during an Inpatient Acute Rehabilitation admission.  The patient will receive intensive therapy as well as Rehabilitation physician, nursing, social worker, and care management interventions.  Due to safety, skin/wound care, disease management, medication administration, pain management, and patient education the patient requires 24 hour a day rehabilitation nursing.  The patient is currently Min-mod A with mobility and basic ADLs.  Discharge setting and therapy post discharge at home with home health is anticipated.  Patient has agreed to participate in the  Acute Inpatient Rehabilitation Program and will admit today.   Preadmission Screen Completed By:  Laura B Staley, 12/09/2021 8:01 AM ______________________________________________________________________   Discussed status with Dr. Montrey Buist  on 12/09/21 at 930 and received approval for admission today.   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   Yes Co-Morbidities requiring supervision/potential complications: anemia, CHF, GERD, HTN, Polyneuropathy, Asthma Due to bladder management, bowel management, safety, skin/wound care, disease management, medication administration, pain management, and patient education, does the patient require 24 hr/day rehab nursing? Yes Does the patient require coordinated care of a physician, rehab nurse, PT, OT, and SLP to address physical and functional deficits in the context of the above medical diagnosis(es)? Yes Addressing deficits in the following areas: balance, endurance, locomotion, strength, transferring, bowel/bladder control, bathing, dressing, feeding, grooming, toileting, cognition, speech, language, and psychosocial support Can the patient actively participate in an intensive therapy program of at least 3 hrs of therapy 5 days a week? Yes The potential for patient to make measurable gains while on inpatient rehab is good Anticipated functional outcomes upon discharge from inpatient rehab: supervision PT, supervision OT, supervision SLP Estimated rehab length of stay to reach the above functional goals is: 18-21 Anticipated discharge destination: Home 10. Overall Rehab/Functional Prognosis: good   MD Signature Geno Sydnor          

## 2021-12-09 NOTE — Progress Notes (Signed)
PMR Admission Coordinator Pre-Admission Assessment   Patient: Kara [name] is an 36 y.o., female MRN: [id number] DOB: 1986/04/11 Height: _0  (1.702 m) Weight: 93.9 kg   Insurance Information HMO:     PPO: yes     PCP:      IPA:      80/20:      OTHER:  PRIMARY: BCBS      Policy#: [id number]      Subscriber: Pt CM Name: Delsa Sale       Phone#: [phone]  Fax: [phone]      Pt. Approved 8/23-9/5 with update due 9/5 Pre-Cert#: [id number]       Employer:  Benefits:  Phone #:      Name:  Irene Shipper Date: 04/19/2021 - still active Deductible: $1,500 ($1,500 met) OOP Max: $5,900 ($5,900 met) CIR: 70% coverage, 30% co-insurance SNF: 70% coverage, 30% co-insurance; with a limit of 100 days/cal yr (100 remaining) Outpatient:  $0 or $72 co-pay per visit pending provider type; limited by medical necessity Home Health:  70% coverage, 30% co-insurance; limited by medical necessity DME: 70% coverage, 30% co-insurance Providers: in network  SECONDARY:       Policy#:       Phone#:    Development worker, community:       Phone#:    The Engineer, petroleum" for patients in Inpatient Rehabilitation Facilities with attached "Privacy Act Susan Moore Records" was provided and verbally reviewed with: N/A   Emergency Contact Information Contact Information       Name Relation Home Work Mobile    [place] Spouse [phone]   [phone]    [place] Mother [phone]   [phone]           Current Medical History  Patient Admitting Diagnosis: Respiratory failure, CVA History of Present Illness: Pt. Is a 36 y.o. 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 EMS was started on  the field found to be in VT's multiple shocks epi and amnio about 15 minutes to ROSC, she was intubated upon arrival to [facility], patient was started on epi drip cardiology was consulted. MRI of the brain showed No acute intracranial abnormality. PT. Was intubated 8/3-8/5, went into respiratory failure 8/5 and was reintubated. Pt. Underwent tracheostomy 11/22/21  Pt. Received TPN and tube feeds via coretrak during admission and was discharged to [facility] 11/26/21. While At [facility], TPN was discontinued, coretrak was removed (Pt. Resumed regular PO diet), and Pt. Was decannulated to room air 12/07/21.  Referral was sent to [place] for further physical medicine and rehabilitation.        Significant Events: 7/28 admitted to [facility] cardiac arrest 8/2 EEG stopped. MRI 8/3 extubated 8/5 reintubated 8/6 trach 8/7 doing well with PMSV. Sips and chips started.  8/9 tolerated trach collar for two days, mental status improved throughout the day and asking about dogs and work per mom   Patient's medical record from [facility] has been reviewed by the rehabilitation admission coordinator and physician.   Past Medical History         Past Medical History:  Diagnosis Date   Alcoholism in remission ([place])      per pt in remission since 2017   Anemia  AR (allergic rhinitis)      allergist-- dr Neldon Mc   Biliary dyskinesia     Diastolic CHF ([place])      followd by dr Mamie Nick. Johnsie Cancel   Environmental allergies     GERD (gastroesophageal reflux disease)     Headache     History of acute pancreatitis      alcoholic pancreatitis ,  3532 and 2017   History of sepsis 06/12/2016    with CAP   History of syncope     History of thrombosis 01/29/2015    splenic vein thrombosis-- treated with coumadin--- readmitted for abd. hematoma due to coumadin treated with IR embolization of the IMA branches   Hypertension     Polyneuropathy     Prolonged QT syndrome      cardiologist-- dr  Johnsie Cancel (notes in epic)   Severe persistent asthma      pulmonologist-- dr dr Bernita Raisin (notes in epic)      Has the patient had major surgery during 100 days prior to admission? Yes   Family History   family history includes Colon polyps in her mother; Diabetes in her father; Heart failure in her father, maternal grandfather, and mother.   Current Medications No current facility-administered medications for this encounter.   Patients Current Diet: Diet regular   Precautions / Restrictions Precautions: Fall     Has the patient had 2 or more falls or a fall with injury in the past year? Yes   Prior Activity Level Community (5-7x/wk): Active in the community PTA     Prior Functional Level Self Care: Did the patient need help bathing, dressing, using the toilet or eating? Independent   Indoor Mobility: Did the patient need assistance with walking from room to room (with or without device)? Independent   Stairs: Did the patient need assistance with internal or external stairs (with or without device)? Independent   Functional Cognition: Did the patient need help planning regular tasks such as shopping or remembering to take medications? Independent   Patient Information Are you of Hispanic, Latino/a,or Spanish origin?: A. No, not of Hispanic, Latino/a, or Spanish origin What is your race?: A. White Do you need or want an interpreter to communicate with a doctor or health care staff?: 0. No   Patient's Response To:  Health Literacy and Transportation Is the patient able to respond to health literacy and transportation needs?: Yes Health Literacy - How often do you need to have someone help you when you read instructions, pamphlets, or other written material from your doctor or pharmacy?: Never In the past 12 months, has lack of transportation kept you from medical appointments or from getting medications?: No In the past 12 months, has lack of transportation kept you from  meetings, work, or from getting things needed for daily living?: No   Home Assistive Devices / Equipment   Prior Device Use: Indicate devices/aids used by the patient prior to current illness, exacerbation or injury? None of the above     Prior Functional Level Current Functional Level  Bed Mobility   Independent Min assist    Transfers   Independent   Min assist    Mobility - Walk/Wheelchair   Independent   Other    Upper Body Dressing   Independent   Min assist    Lower Body Dressing   Independent   Mod assist    Grooming   Independent   Mod Independent    Eating/Drinking   Independent  Mod Independent    Scientist, physiological   Mod assist    Bladder Continence    Continent   contient    Bowel Management   Continent   continent    Stair Climbing   Independent   Other    Communication     mod I    Memory     mod I      Special Needs/ Care Considerations Skin stoma site, sacral wound, Bowel management continent, and Bladder management continent   Previous Home Environment (from acute therapy documentation) Living Arrangements: Spouse/significant other; Other (Comment)  Lives With: Spouse Available Help at Discharge: Available 24 hours/day; Family Type of Home: House Home Layout: One level Home Access: Stairs to enter Entrance Stairs-Rails: None Entrance Stairs-Number of Steps: 2 Bathroom Shower/Tub: Multimedia programmer: Handicapped height Bathroom Accessibility: Yes How Accessible: Accessible via walker; Accessible via wheelchair Home Care Services: No     Discharge Living Setting Plans for Discharge Living Setting: Patient's home; Other (Comment) Type of Home at Discharge: House Discharge Home Layout: One level Discharge Home Access: Stairs to enter Entrance Stairs-Rails: None Entrance Stairs-Number of Steps: 2 Discharge Bathroom Shower/Tub: Tub/shower unit Discharge Bathroom Toilet: Standard Discharge Bathroom  Accessibility: Yes How Accessible: Accessible via walker Does the patient have any problems obtaining your medications?: No     Social/Family/Support Systems Patient Roles: Parent; Spouse Contact Information: [phone] Anticipated Caregiver: Clovia Cuff (mother) Ability/Limitations of Caregiver: 24/7 min A Caregiver Availability: 24/7 Discharge Plan Discussed with Primary Caregiver: Yes Is Caregiver In Agreement with Plan?: Yes Does Caregiver/Family have Issues with Lodging/Transportation while Pt is in Rehab?: No     Goals Patient/Family Goal for Rehab: PT/OT/SLP Supervision Expected length of stay: 18-21 days Pt/Family Agrees to Admission and willing to participate: Yes Program Orientation Provided & Reviewed with Pt/Caregiver Including Roles  & Responsibilities: Yes     Decrease burden of Care through IP rehab admission: Not anticipated   Possible need for SNF placement upon discharge: not anticipated    Patient Condition: I have reviewed medical records from [facility] , spoken with CM, and patient. I met with patient at the bedside for inpatient rehabilitation assessment.  Patient will benefit from ongoing PT, OT, and SLP, can actively participate in 3 hours of therapy a day 5 days of the week, and can make measurable gains during the admission.  Patient will also benefit from the coordinated team approach during an Inpatient Acute Rehabilitation admission.  The patient will receive intensive therapy as well as Rehabilitation physician, nursing, social worker, and care management interventions.  Due to safety, skin/wound care, disease management, medication administration, pain management, and patient education the patient requires 24 hour a day rehabilitation nursing.  The patient is currently Min-mod A with mobility and basic ADLs.  Discharge setting and therapy post discharge at home with home health is anticipated.  Patient has agreed to participate in the  Acute Inpatient Rehabilitation Program and will admit today.   Preadmission Screen Completed By:  Genella Mech, 12/09/2021 8:01 AM ______________________________________________________________________   Discussed status with Dr. Curlene Dolphin  on 12/09/21 at 45 and received approval for admission today.   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  Yes Co-Morbidities requiring supervision/potential complications: anemia, CHF, GERD, HTN, Polyneuropathy, Asthma Due to bladder management, bowel management, safety, skin/wound care, disease management, medication administration, pain management, and patient education, does the patient require 24 hr/day rehab nursing? Yes Does the patient require coordinated care of a physician, rehab nurse, PT, OT, and SLP to address physical and functional deficits in the context of the above medical diagnosis(es)? Yes Addressing deficits in the following areas: balance, endurance, locomotion, strength, transferring, bowel/bladder control, bathing, dressing, feeding, grooming, toileting, cognition, speech, language, and psychosocial support Can the patient actively participate in an intensive therapy program of at least 3 hrs of therapy 5 days a week? Yes The potential for patient to make measurable gains while on inpatient rehab is good Anticipated functional outcomes upon discharge from inpatient rehab: supervision PT, supervision OT, supervision SLP Estimated rehab length of stay to reach the above functional goals is: 18-21 Anticipated discharge destination: Home 10. Overall Rehab/Functional Prognosis: good   MD Signature Jennye Boroughs

## 2021-12-09 NOTE — PMR Pre-admission (Shared)
PMR Admission Coordinator Pre-Admission Assessment   Patient: Kara [name] is an 36 y.o., female MRN: [id number] DOB: 02/09/1986 Height: 5' 7" (1.702 m) Weight: 93.9 kg   Insurance Information HMO:     PPO: yes     PCP:      IPA:      80/20:      OTHER:  PRIMARY: BCBS      Policy#: [id number]      Subscriber: Pt CM Name: Jen       Phone#: [phone]  Fax: [phone]      Pt. Approved 8/23-9/5 with update due 9/5 Pre-Cert#: [id number]       Employer:  Benefits:  Phone #:      Name:  Eff Date: 04/19/2021 - still active Deductible: $1,500 ($1,500 met) OOP Max: $5,900 ($5,900 met) CIR: 70% coverage, 30% co-insurance SNF: 70% coverage, 30% co-insurance; with a limit of 100 days/cal yr (100 remaining) Outpatient:  $0 or $72 co-pay per visit pending provider type; limited by medical necessity Home Health:  70% coverage, 30% co-insurance; limited by medical necessity DME: 70% coverage, 30% co-insurance Providers: in network  SECONDARY:       Policy#:       Phone#:    Financial Counselor:       Phone#:    The "Data Collection Information Summary" for patients in Inpatient Rehabilitation Facilities with attached "Privacy Act Statement-Health Care Records" was provided and verbally reviewed with: N/A   Emergency Contact Information Contact Information       Name Relation Home Work Mobile    [place],Nathaniel Spouse [phone]   [phone]    [place],[name] Mother [phone]   [phone]           Current Medical History  Patient Admitting Diagnosis: Respiratory failure, CVA History of Present Illness: Pt. Is a 36 y.o. 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 EMS was started on  the field found to be in VT's multiple shocks epi and amnio about 15 minutes to ROSC, she was intubated upon arrival to [facility], patient was started on epi drip cardiology was consulted. MRI of the brain showed No acute intracranial abnormality. PT. Was intubated 8/3-8/5, went into respiratory failure 8/5 and was reintubated. Pt. Underwent tracheostomy 11/22/21  Pt. Received TPN and tube feeds via coretrak during admission and was discharged to [facility] 11/26/21. While At [facility], TPN was discontinued, coretrak was removed (Pt. Resumed regular PO diet), and Pt. Was decannulated to room air 12/07/21.  Referral was sent to [place] [facility] for further physical medicine and rehabilitation.        Significant Events: 7/28 admitted to [facility] cardiac arrest 8/2 EEG stopped. MRI 8/3 extubated 8/5 reintubated 8/6 trach 8/7 doing well with PMSV. Sips and chips started.  8/9 tolerated trach collar for two days, mental status improved throughout the day and asking about dogs and work per mom   Patient's medical record from [facility] has been reviewed by the rehabilitation admission coordinator and physician.   Past Medical History         Past Medical History:  Diagnosis Date   Alcoholism in remission ([facility])      per pt in remission since 2017   Anemia       AR (allergic rhinitis)      allergist-- dr Neldon Mc   Biliary dyskinesia     Diastolic CHF ([place])      followd by dr Mamie Nick. Johnsie Cancel   Environmental allergies     GERD (gastroesophageal reflux disease)     Headache     History of acute pancreatitis      alcoholic pancreatitis ,  3532 and 2017   History of sepsis 06/12/2016    with CAP   History of syncope     History of thrombosis 01/29/2015    splenic vein thrombosis-- treated with coumadin--- readmitted for abd. hematoma due to coumadin treated with IR embolization of the IMA branches   Hypertension     Polyneuropathy     Prolonged QT syndrome      cardiologist-- dr  Johnsie Cancel (notes in epic)   Severe persistent asthma      pulmonologist-- dr dr Bernita Raisin (notes in epic)      Has the patient had major surgery during 100 days prior to admission? Yes   Family History   family history includes Colon polyps in her mother; Diabetes in her father; Heart failure in her father, maternal grandfather, and mother.   Current Medications No current facility-administered medications for this encounter.   Patients Current Diet: Diet regular   Precautions / Restrictions Precautions: Fall     Has the patient had 2 or more falls or a fall with injury in the past year? Yes   Prior Activity Level Community (5-7x/wk): Active in the community PTA     Prior Functional Level Self Care: Did the patient need help bathing, dressing, using the toilet or eating? Independent   Indoor Mobility: Did the patient need assistance with walking from room to room (with or without device)? Independent   Stairs: Did the patient need assistance with internal or external stairs (with or without device)? Independent   Functional Cognition: Did the patient need help planning regular tasks such as shopping or remembering to take medications? Independent   Patient Information Are you of Hispanic, Latino/a,or Spanish origin?: A. No, not of Hispanic, Latino/a, or Spanish origin What is your race?: A. White Do you need or want an interpreter to communicate with a doctor or health care staff?: 0. No   Patient's Response To:  Health Literacy and Transportation Is the patient able to respond to health literacy and transportation needs?: Yes Health Literacy - How often do you need to have someone help you when you read instructions, pamphlets, or other written material from your doctor or pharmacy?: Never In the past 12 months, has lack of transportation kept you from medical appointments or from getting medications?: No In the past 12 months, has lack of transportation kept you from  meetings, work, or from getting things needed for daily living?: No   Home Assistive Devices / Equipment   Prior Device Use: Indicate devices/aids used by the patient prior to current illness, exacerbation or injury? None of the above     Prior Functional Level Current Functional Level  Bed Mobility   Independent Min assist    Transfers   Independent   Min assist    Mobility - Walk/Wheelchair   Independent   Other    Upper Body Dressing   Independent   Min assist    Lower Body Dressing   Independent   Mod assist    Grooming   Independent   Mod Independent    Eating/Drinking   Independent  Mod Independent    Scientist, physiological   Mod assist    Bladder Continence    Continent   contient    Bowel Management   Continent   continent    Stair Climbing   Independent   Other    Communication     mod I    Memory    mod I      Special Needs/ Care Considerations Skin stoma site, sacral wound, Bowel management continent, and Bladder management continent   Previous Home Environment (from acute therapy documentation) Living Arrangements: Spouse/significant other; Other (Comment)  Lives With: Spouse Available Help at Discharge: Available 24 hours/day; Family Type of Home: House Home Layout: One level Home Access: Stairs to enter Entrance Stairs-Rails: None Entrance Stairs-Number of Steps: 2 Bathroom Shower/Tub: Multimedia programmer: Handicapped height Bathroom Accessibility: Yes How Accessible: Accessible via walker; Accessible via wheelchair Home Care Services: No     Discharge Living Setting Plans for Discharge Living Setting: Patient's home; Other (Comment) Type of Home at Discharge: House Discharge Home Layout: One level Discharge Home Access: Stairs to enter Entrance Stairs-Rails: None Entrance Stairs-Number of Steps: 2 Discharge Bathroom Shower/Tub: Tub/shower unit Discharge Bathroom Toilet: Standard Discharge Bathroom  Accessibility: Yes How Accessible: Accessible via walker Does the patient have any problems obtaining your medications?: No     Social/Family/Support Systems Patient Roles: Parent; Spouse Contact Information: [phone] Anticipated Caregiver: Clovia Cuff (mother) Ability/Limitations of Caregiver: 24/7 min A Caregiver Availability: 24/7 Discharge Plan Discussed with Primary Caregiver: Yes Is Caregiver In Agreement with Plan?: Yes Does Caregiver/Family have Issues with Lodging/Transportation while Pt is in Rehab?: No     Goals Patient/Family Goal for Rehab: PT/OT/SLP Supervision Expected length of stay: 18-21 days Pt/Family Agrees to Admission and willing to participate: Yes Program Orientation Provided & Reviewed with Pt/Caregiver Including Roles  & Responsibilities: Yes     Decrease burden of Care through IP rehab admission: Not anticipated   Possible need for SNF placement upon discharge: not anticipated    Patient Condition: I have reviewed medical records from [facility] , spoken with CM, and patient. I met with patient at the bedside for inpatient rehabilitation assessment.  Patient will benefit from ongoing PT, OT, and SLP, can actively participate in 3 hours of therapy a day 5 days of the week, and can make measurable gains during the admission.  Patient will also benefit from the coordinated team approach during an Inpatient Acute Rehabilitation admission.  The patient will receive intensive therapy as well as Rehabilitation physician, nursing, social worker, and care management interventions.  Due to safety, skin/wound care, disease management, medication administration, pain management, and patient education the patient requires 24 hour a day rehabilitation nursing.  The patient is currently Min-mod A with mobility and basic ADLs.  Discharge setting and therapy post discharge at home with home health is anticipated.  Patient has agreed to participate in the  Acute Inpatient Rehabilitation Program and will admit today.   Preadmission Screen Completed By:  Genella Mech, 12/09/2021 8:01 AM ______________________________________________________________________   Discussed status with Dr. Curlene Dolphin  on 12/09/21 at 36 and received approval for admission today.   Admission Coordinator:  Genella Mech, CCC-SLP, time 962 /Date 12/09/21    Assessment/Plan: Diagnosis: Does the need for close, 24 hr/day Medical supervision in concert with the patient's rehab needs make it unreasonable for this patient to be served in a less intensive setting? {yes_no_potentially:3041433} Co-Morbidities requiring supervision/potential complications: *** Due  to {due JQ:9643838}, does the patient require 24 hr/day rehab nursing? {yes_no_potentially:3041433} Does the patient require coordinated care of a physician, rehab nurse, PT, OT, and SLP to address physical and functional deficits in the context of the above medical diagnosis(es)? {yes_no_potentially:3041433} Addressing deficits in the following areas: {deficits:3041436} Can the patient actively participate in an intensive therapy program of at least 3 hrs of therapy 5 days a week? {yes_no_potentially:3041433} The potential for patient to make measurable gains while on inpatient rehab is {potential:3041437} Anticipated functional outcomes upon discharge from inpatient rehab: {functional outcomes:304600100} PT, {functional outcomes:304600100} OT, {functional outcomes:304600100} SLP Estimated rehab length of stay to reach the above functional goals is: *** Anticipated discharge destination: {anticipated dc setting:21604} 10. Overall Rehab/Functional Prognosis: {potential:3041437}   MD Signature ***

## 2021-12-09 NOTE — H&P (Signed)
Physical Medicine and Rehabilitation Admission H&P     CC: Functional deficits due to ABI.     HPI: Kara [name] is a 36 year old female with history of HTN, diastolic CHF, severe asthma, alcohol abuse-quit 2017, pancreatitis on creon, seizure disorder, sensory polyneuropathy, recent septic bursitis right knee;  who was originally admitted on 11/04/21 after found pulseless and apneic with suspected downtime of about 20-30 minutes. She was found by her husband who started CPR. She was found to be in VT and treated with ACLS protocol with ROSC. EKG showed ST changes with elevated troponin felt to be due to cardiac arrest. Cardiology felt that  cardiac arrest was secondary to be due to VT/VF in setting of QT prolongation and electrolyte abnormality due to severe hypokalemia.  She reports she was also recently started on Seroquel at the time. 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 Patient with decreased LOC and care team discussed poor overall prognosis due to severe anoxic BI. Family elected full scope of care. She was extubated without difficulty but had issues with agitation requiring sedation. She was reintubated due to respiratory decline, required tracheostomy 08/09 by Dr. Tacy Learn with ATC trials. Cortak placed for nutritional support 7/23.  She continued to have bouts of agitation alternating with lethargy question due to withdrawal from narcotics. She was evaluated by Dr. Sophronia Simas who felt that she was not a candidate for intervention and to follow up in 2-3 months.    She was transferred to [facility] on 11/26/21 for vent wean and medical management.  She tolerated vent wean and was decannulated by 08/20. She developed leucocytosis with  cough and was was started on Vanc, Flagyl and cefepime for PNA but developed rash therefore this was d/c after 5 day course of treatment.  Cefepime later de-escalated to Rocephin.  Respiratory status has been stable and swallow function has improved. She is tolerating regular diet with meds crushed in puree. Anxiety managed with Klonopin and chest wall pain being treated with oxycodone.   2D echo done for work up of pain 08/21 revealing EF 60-65% and no wall abnormality.  Hypomagnesemia continues to be an issue with Mg-1.4 today which was treated with IV magnesium. Rash has resolved with dose of solumedrol but she continues to have itching which is managed with prn benadryl and solumedrol.  Mentation has greatly improved and she is making great gains with therapy.  She has chronic pain due to pancreatitis followed by [facility] as an outpatient. She says she takes oxycodone 15mg  for this pain as an outpatient. She also has chronic peripheral polyneuroapthy managed with gabapentin.  She continues to be limited by cognitive deficits, ataxic gait, weakness and chest wall pain. CIR recommended due to functional decline.      Review of Systems  Constitutional:  Negative for chills and fever.  HENT:  Negative for hearing loss and tinnitus.  No HA Eyes:  Negative for blurred vision and double vision.  Respiratory:  Negative for cough and shortness of breath.   Cardiovascular:  Negative for leg swelling.  Gastrointestinal:  Positive for abdominal pain (chronic abdominal pain), diarrhea (chronic) and heartburn. Negative for nausea.  Genitourinary:  Negative for dysuria and urgency.  Musculoskeletal:  Negative for back pain and myalgias.  Skin:  Positive  for itching. Negative for rash.  Neurological:  Positive for sensory change (tingling, sharp pains BUE/BLE) and weakness. Negative for dizziness and headaches.  Psychiatric/Behavioral:  Positive for memory loss. Negative for depression. The patient  is not nervous/anxious and does not have insomnia.             Past Medical History:  Diagnosis Date   Alcoholism in remission ([place])      per pt in remission since 2017   Anemia     AR (allergic rhinitis)      allergist-- dr Neldon Mc   Biliary dyskinesia     Diastolic CHF ([place])      followd by dr Mamie Nick. Johnsie Cancel   Environmental allergies     GERD (gastroesophageal reflux disease)     Headache     History of acute pancreatitis      alcoholic pancreatitis ,  [id number] and 2017   History of sepsis 06/12/2016    with CAP   History of syncope     History of thrombosis 01/29/2015    splenic vein thrombosis-- treated with coumadin--- readmitted for abd. hematoma due to coumadin treated with IR embolization of the IMA branches   Hypertension     Polyneuropathy     Prolonged QT syndrome      cardiologist-- dr Johnsie Cancel (notes in epic)   Severe persistent asthma      pulmonologist-- dr dr Bernita Raisin (notes in epic)           Past Surgical History:  Procedure Laterality Date   CHOLECYSTECTOMY N/A 06/21/2018    Procedure: LAPAROSCOPIC CHOLECYSTECTOMY;  Surgeon: Coralie Keens, MD;  Location: [facility];  Service: General;  Laterality: N/A;   ESOPHAGOGASTRODUODENOSCOPY N/A 07/17/2014    Procedure: ESOPHAGOGASTRODUODENOSCOPY (EGD);  Surgeon: Teena Irani, MD;  Location: [facility];  Service: Endoscopy;  Laterality: N/A;   TYMPANOSTOMY TUBE PLACEMENT Bilateral 11 MONTHS OLD   VIDEO BRONCHOSCOPY Bilateral 10/08/2016    Procedure: VIDEO BRONCHOSCOPY WITH FLUORO;  Surgeon: Marshell Garfinkel, MD;  Location: [facility];  Service: Cardiopulmonary;  Laterality: Bilateral;           Family History  Problem Relation Age of Onset   Heart failure Mother     Colon polyps Mother     Heart failure Father     Diabetes Father     Heart failure Maternal Grandfather     Colon cancer Neg Hx        Social History: Married. Works as an Teacher, adult education at [employer]. She reports that she has been smoking  cigarettes--about 1/2 PPD. She has never used smokeless tobacco. She reports that she does not currently use alcohol. She reports that she does not currently use drugs.          Allergies  Allergen Reactions   Acetaminophen Diarrhea and Nausea And Vomiting   Nsaids Diarrhea and Nausea And Vomiting   Other Hives and Itching      Reaction to tree nuts   Peanuts [Peanut Oil] Hives and Itching   Triamterene-Hctz Nausea Only and Other (See Comments)      dizzy            Medications Prior to Admission  Medication Sig Dispense Refill   albuterol (VENTOLIN HFA) 108 (90 Base) MCG/ACT inhaler 2 puffs every 4 hours as needed for coughing or wheezing. (Patient taking differently: Inhale 2 puffs into the lungs every 4 (four) hours as needed for wheezing or shortness of breath (cough).) 18 g 1  BREO ELLIPTA 200-25 MCG/INH AEPB INHALE 1 PUFF INTO THE LUNGS DAILY (Patient taking differently: Inhale 1 puff into the lungs daily as needed (shortness of breath/wheezing).) 180 each 1   Cholecalciferol (VITAMIN D3 PO) Take 1 tablet by mouth every morning.       EPINEPHrine (EPIPEN 2-PAK) 0.3 mg/0.3 mL IJ SOAJ injection Inject 0.3 mLs (0.3 mg total) into the muscle once as needed (for severe allergic reaction). 2 Device 1   gabapentin (NEURONTIN) 300 MG capsule TAKE 3 CAPSULES BY MOUTH TWICE DAILY (Patient taking differently: Take 900 mg by mouth 2 (two) times daily.) 180 capsule 0   lipase/protease/amylase (CREON) 36000 UNITS CPEP capsule Take 72,000 Units by mouth 3 (three) times daily with meals.       magnesium oxide (MAGOX 400) 400 (240 Mg) MG tablet Take 1 tablet (400 mg total) by mouth daily. (Patient not taking: Reported on 11/04/2021) 30 tablet 0   Melatonin 10 MG TABS Take 10 mg by mouth at bedtime.       metoprolol succinate (TOPROL-XL) 50 MG 24 hr tablet Take 1 tablet (50 mg total) by mouth daily. Pt needs to keep upcoming appt in Nov for 90 day refills (Patient taking differently: Take 50 mg by  mouth every morning.) 30 tablet 0   montelukast (SINGULAIR) 10 MG tablet TAKE ONE TABLET BY MOUTH AT BEDTIME (Patient taking differently: Take 10 mg by mouth at bedtime.) 30 tablet 0   Multiple Vitamins-Minerals (MULTIVITAMIN WITH MINERALS) tablet Take 1 tablet by mouth daily.       naloxone (NARCAN) nasal spray 4 mg/0.1 mL Place 0.4 mg into the nose once as needed (severe allergic reaction).       omeprazole (PRILOSEC) 40 MG capsule TAKE 1 CAPSULE(40 MG) BY MOUTH DAILY (Patient taking differently: Take 40 mg by mouth at bedtime.) 30 capsule 0   ondansetron (ZOFRAN-ODT) 8 MG disintegrating tablet Take 8 mg by mouth 3 (three) times daily as needed for nausea or vomiting.       oxyCODONE (ROXICODONE) 15 MG immediate release tablet Take 15 mg by mouth 3 (three) times daily as needed (chronic pancreatic pain).       polyethylene glycol (MIRALAX / GLYCOLAX) 17 g packet Take 17 g by mouth daily as needed for mild constipation or moderate constipation. (Patient not taking: Reported on 11/04/2021) 14 each 0   QUEtiapine (SEROQUEL) 100 MG tablet Take 100 mg by mouth at bedtime.       rosuvastatin (CRESTOR) 10 MG tablet Take 10 mg by mouth every morning.       sertraline (ZOLOFT) 100 MG tablet Take 1 tablet (100 mg total) by mouth daily. 90 tablet 3   spironolactone (ALDACTONE) 25 MG tablet Take 25 mg by mouth daily as needed (leg swelling).       topiramate (TOPAMAX) 100 MG tablet Take 1 tablet (100 mg total) by mouth 2 (two) times daily. 180 tablet 3      Home: Home Living Living Arrangements: Spouse/significant other, Other (Comment) Available Help at Discharge: Available 24 hours/day, Family Type of Home: House Home Access: Stairs to enter [employer] of Steps: 2 Entrance Stairs-Rails: None Home Layout: One level Bathroom Shower/Tub: Health visitor: Handicapped height Bathroom Accessibility: Yes  Lives With: Spouse   Functional History: Independent without AD.     Functional Status:  Mobility:   ADL:   Cognition: Height 5\' 7"  (1.702 m), weight 93.9 kg. Physical Exam Vitals and nursing note reviewed.   General: Alert  and oriented x 4, No apparent distress HEENT: Head is normocephalic, atraumatic, PERRLA, EOMI, sclera anicteric, oral mucosa pink and moist, prior trach site healing well Neck: Supple without JVD or lymphadenopathy Heart: Reg rate and rhythm. No murmurs rubs or gallops Chest: CTA bilaterally without wheezes, rales, or rhonchi; no distress, chest wall tenderness Abdomen: Soft, non-tender, non-distended, bowel sounds positive. Extremities: No clubbing, cyanosis, or edema. Pulses are 2+ Psych: Pt's affect is appropriate. Pt is cooperative Skin: warm and dry, healing I&D R infrapatella incision Neuro:  Alert and oriented x4, follows 2 step commands,makes eye contact with examiners,  can name president, not able to state her age correctly, unable to count down by 7s after 93, short and long term memory mildly abnormal, speech normal, sensation intact to LT in all 4 extremities  FTN slightly decreased on Left Strength 5/5 in b/l UE and b/l LE Musculoskeletal:  No abnormal tone or ROM noted Claw toes b/l LE digits 2-4      Lab Results Last 48 Hours  No results found for this or any previous visit (from the past 48 hour(s)).   Imaging Results (Last 48 hours)  No results found.         Height 5\' 7"  (1.702 m), weight 93.9 kg.   Medical Problem List and Plan: 1. Functional deficits secondary to anoxic brain injury after cardiac arrest             -patient may shower             -ELOS/Goals: 13-17 days -Admit to CIR 2.  Antithrombotics: -DVT/anticoagulation:  Pharmaceutical: Lovenox             -antiplatelet therapy: N/A 3. Pain Management: Oxycodone 5mg  prn.  As outpatient she follows with [facility] on oxycodone. --Will add lidocaine patches to chest wall 4. Mood/Behavior/Sleep: LCSW to follow for evaluation and  support.  Continue Zoloft             --slept better last 2 nights after ambien d/c. Monitor for now             -antipsychotic agents: N/A 5. Neuropsych/cognition: This patient is capable of making decisions on his own behalf. 6. Skin/Wound Care: Routine pressure relief  measures.  7. Fluids/Electrolytes/Nutrition: Monitor I/O. Check CMET in am.  8. VT arrest/QT prolongation: Continue Metoprolol BID.  Monitor K+ and Mg. 9. Hypomagnesemia: Recheck labs in am. MG 1.4 8/23 --Will add daily supplement.  10. Seizure d/o (GTC/starring spells/[facility]): Stable on Lacosamide 100mg  bid.  11. Depression:  Mood stable on increased dose Zoloft.  12. Acute blood loss anemia: H/H has varied from 8-10 range. Last HGB 10 8/21. Recheck in am             --monitor for signs of bleeding. 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              --continue Singulair and Budesonide BID.  14. 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 Right septic bursitis: Has completed treatment.   17. Hives  -Start prednisone dose pack and allegra     I have personally performed a face to face diagnostic evaluation of this patient and formulated the key components of the plan.  Additionally, I have personally reviewed laboratory data, imaging studies, as  well as relevant notes and concur with the physician assistant's documentation above.  The patient's status has not changed from the original H&P.  Any changes in documentation from the acute care chart have been noted above.  Fanny Dance, MD, [place] [facility]    Jacquelynn Cree, PA-C 12/09/2021

## 2021-12-09 NOTE — Progress Notes (Signed)
Inpatient Rehabilitation Admission Medication Review by a Pharmacist  A complete drug regimen review was completed for this patient to identify any potential clinically significant medication issues.  High Risk Drug Classes Is patient taking? Indication by Medication  Antipsychotic Yes Compazine PO/IM - N/V  Anticoagulant Yes Lovenox - DVT px  Antibiotic No   Opioid Yes Oxycodone - pain  Antiplatelet No   Hypoglycemics/insulin No   Vasoactive Medication Yes Metoprolol - HTN  Chemotherapy No   Other Yes Atorvastatin - HLD Prednisone/cetirizine/singulair - allergic reaction      Type of Medication Issue Identified Description of Issue Recommendation(s)  Drug Interaction(s) (clinically significant)     Duplicate Therapy     Allergy     No Medication Administration End Date     Incorrect Dose     Additional Drug Therapy Needed     Significant med changes from prior encounter (inform family/care partners about these prior to discharge).    Other       Clinically significant medication issues were identified that warrant physician communication and completion of prescribed/recommended actions by midnight of the next day:  No  Name of provider notified for urgent issues identified:   Provider Method of Notification:     Pharmacist comments:   Time spent performing this drug regimen review (minutes):  20   Ulyses Southward, PharmD, [place], AAHIVP, CPP Infectious Disease Pharmacist 12/09/2021 5:49 PM

## 2021-12-09 NOTE — Progress Notes (Signed)
Patient arrieved via [facility] from [employer] accompanied by husband and mother. Patient is A&O x 4 and able to make her needs known. Patient has several scratches scatted over her arms, legs, and ABD. Sternum has reddened area from AED pad. Right knee old incision from I&D, knee discolored. Feet warm to touch, positive pedal pulses, cap refill less than 3 seconds. Patient C/O her skin being itchy. PRN Benadryl given as ordered, hypoallergenic sheets applied to bed, gown changed. Family at bedside.

## 2021-12-10 DIAGNOSIS — G931 Anoxic brain damage, not elsewhere classified: Secondary | ICD-10-CM | POA: Diagnosis not present

## 2021-12-10 LAB — COMPREHENSIVE METABOLIC PANEL
ALT: 17 U/L (ref 0–44)
AST: 53 U/L — ABNORMAL HIGH (ref 15–41)
Albumin: 2.9 g/dL — ABNORMAL LOW (ref 3.5–5.0)
Alkaline Phosphatase: 221 U/L — ABNORMAL HIGH (ref 38–126)
Anion gap: 11 (ref 5–15)
BUN: 9 mg/dL (ref 6–20)
CO2: 24 mmol/L (ref 22–32)
Calcium: 9.2 mg/dL (ref 8.9–10.3)
Chloride: 103 mmol/L (ref 98–111)
Creatinine, Ser: 0.68 mg/dL (ref 0.44–1.00)
GFR, Estimated: >60 mL/min (ref >60)
Glucose, Bld: 152 mg/dL — ABNORMAL HIGH (ref 70–99)
Potassium: 3.6 mmol/L (ref 3.5–5.1)
Sodium: 138 mmol/L (ref 135–145)
Total Bilirubin: 0.9 mg/dL (ref 0.3–1.2)
Total Protein: 6.9 g/dL (ref 6.5–8.1)

## 2021-12-10 LAB — CBC WITH DIFFERENTIAL/PLATELET
Abs Immature Granulocytes: 0.07 10*3/uL (ref 0.00–0.07)
Basophils Absolute: 0.1 10*3/uL (ref 0.0–0.1)
Basophils Relative: 2 %
Eosinophils Absolute: 0.2 10*3/uL (ref 0.0–0.5)
Eosinophils Relative: 3 %
HCT: 29.3 % — ABNORMAL LOW (ref 36.0–46.0)
Hemoglobin: 9.8 g/dL — ABNORMAL LOW (ref 12.0–15.0)
Immature Granulocytes: 1 %
Lymphocytes Relative: 33 %
Lymphs Abs: 2.5 10*3/uL (ref 0.7–4.0)
MCH: 31.9 pg (ref 26.0–34.0)
MCHC: 33.4 g/dL (ref 30.0–36.0)
MCV: 95.4 fL (ref 80.0–100.0)
Monocytes Absolute: 0.4 10*3/uL (ref 0.1–1.0)
Monocytes Relative: 6 %
Neutro Abs: 4.3 10*3/uL (ref 1.7–7.7)
Neutrophils Relative %: 55 %
Platelets: 171 10*3/uL (ref 150–400)
RBC: 3.07 MIL/uL — ABNORMAL LOW (ref 3.87–5.11)
RDW: 13.5 % (ref 11.5–15.5)
WBC: 7.7 10*3/uL (ref 4.0–10.5)
nRBC: 0 % (ref 0.0–0.2)

## 2021-12-10 LAB — MAGNESIUM: Magnesium: 1.6 mg/dL — ABNORMAL LOW (ref 1.7–2.4)

## 2021-12-10 MED ORDER — TRAZODONE HCL 50 MG PO TABS
75.0000 mg | ORAL_TABLET | Freq: Every day | ORAL | Status: DC
  Administered 2021-12-10 – 2021-12-14 (×5): 75 mg via ORAL
  Filled 2021-12-10 (×5): qty 2

## 2021-12-10 MED ORDER — MAGNESIUM OXIDE -MG SUPPLEMENT 400 (240 MG) MG PO TABS
800.0000 mg | ORAL_TABLET | Freq: Every day | ORAL | Status: DC
  Filled 2021-12-10: qty 2

## 2021-12-10 NOTE — Progress Notes (Signed)
Inpatient Rehabilitation Care Coordinator Assessment and Plan Patient Details  Name: Kara [name] MRN: [id number] Date of Birth: 11-Nov-1985  Today's Date: 12/10/2021  Hospital Problems: Principal Problem:   Anoxic brain injury [facility])  Past Medical History:  Past Medical History:  Diagnosis Date   Alcoholism in remission ([facility])    per pt in remission since 2017   Anemia    AR (allergic rhinitis)    allergist-- dr Lucie Leather   Biliary dyskinesia    Diastolic CHF ([facility])    followd by dr Demetrius Charity. Eden Emms   Environmental allergies    GERD (gastroesophageal reflux disease)    Headache    History of acute pancreatitis    alcoholic pancreatitis ,  2016 and 2017   History of sepsis 06/12/2016   with CAP   History of syncope    History of thrombosis 01/29/2015   splenic vein thrombosis-- treated with coumadin--- readmitted for abd. hematoma due to coumadin treated with IR embolization of the IMA branches   Hypertension    Polyneuropathy    Prolonged QT syndrome    cardiologist-- dr Eden Emms (notes in epic)   Severe persistent asthma    pulmonologist-- dr dr Belva Crome (notes in epic)   Past Surgical History:  Past Surgical History:  Procedure Laterality Date   CHOLECYSTECTOMY N/A 06/21/2018   Procedure: LAPAROSCOPIC CHOLECYSTECTOMY;  Surgeon: Abigail Miyamoto, MD;  Location: [facility];  Service: General;  Laterality: N/A;   ESOPHAGOGASTRODUODENOSCOPY N/A 07/17/2014   Procedure: ESOPHAGOGASTRODUODENOSCOPY (EGD);  Surgeon: Dorena Cookey, MD;  Location: [facility];  Service: Endoscopy;  Laterality: N/A;   TYMPANOSTOMY TUBE PLACEMENT Bilateral 11 MONTHS OLD   VIDEO BRONCHOSCOPY Bilateral 10/08/2016   Procedure: VIDEO BRONCHOSCOPY WITH FLUORO;  Surgeon: Chilton Greathouse, MD;  Location: [facility];  Service: Cardiopulmonary;  Laterality: Bilateral;   Social History:  reports that she has never smoked. She has never used smokeless tobacco. She reports that she does not currently use alcohol. She reports  that she does not currently use drugs.  Family / Support Systems Marital Status: Married Patient Roles: Spouse, Parent, Other (Comment) (employee) Spouse/Significant Other: Hulan Saas [phone] Other Supports: Lorenza Evangelist [phone] Anticipated Caregiver: Steward Drone and husband Ability/Limitations of Caregiver: Husband works but Mom can be there if needed while husband is working Medical laboratory scientific officer: 24/7 Family Dynamics: Close knit with family and friends, she feels she has good supports and is grateful to have all of them  Social History Preferred language: English Religion: None     Abuse/Neglect Abuse/Neglect Assessment Can Be Completed: Yes Physical Abuse: Denies Verbal Abuse: Denies Sexual Abuse: Denies Exploitation of patient/patient's resources: Denies Self-Neglect: Denies  Patient response to: Social Isolation - How often do you feel lonely or isolated from those around you?: Never  Emotional Status Pt's affect, behavior and adjustment status: Pt is motivated to do well and feels she has make good progress in the past few days and is hopeful this will continue. She has always been independent and wants to get back to this level. Recent Psychosocial Issues: other health issues recent hospitalization prior to admission for this Psychiatric History: History of anxiety takes medications for this and finds it helpful. May benefit from seeing neuro-psych while here due to all she has been through Substance Abuse History: History of ETOH 2017 no other issues  Patient / Family Perceptions, Expectations & Goals Pt/Family understanding of illness & functional limitations: Pt and MOm can explain her reason for hospitalization and talk with the MD's daily when rounding. Both feel she is  on the road to recvoer and is doing remarkably well considering what she has been through. Premorbid pt/family roles/activities: Wife, daughter, dog mom, employee, friend Anticipated changes in  roles/activities/participation: resume Pt/family expectations/goals: Pt states: " I have no memory of the evnets but have been told how bad I was. Glad to be doing better and hopeful can get to independent level again."  Mom states: " She is doing well and am amazed how well she is doing now."  [employer]: None Premorbid Home Care/DME Agencies: None Transportation available at discharge: husband and Mom-unitl pt can be cleared to drive again Is the patient able to respond to transportation needs?: Yes In the past 12 months, has lack of transportation kept you from medical appointments or from getting medications?: No In the past 12 months, has lack of transportation kept you from meetings, work, or from getting things needed for daily living?: No Resource referrals recommended: Neuropsychology  Discharge Planning Living Arrangements: Spouse/significant other Support Systems: Spouse/significant other, Parent, Other relatives, Friends/neighbors Type of Residence: Private residence Insurance Resources: Media planner (specify) Chief Executive Officer) Surveyor, quantity Resources: Employment, Garment/textile technologist Screen Referred: No Living Expenses: Banker Management: Patient, Spouse Does the patient have any problems obtaining your medications?: No Home Management: Both she and husband Patient/Family Preliminary Plans: Return home with husband who does work but pt's MOm can be there with her if 24/7 care is needed at DC. Will await therapy evaluations and work on discharge needs Care Coordinator Barriers to Discharge: Insurance for SNF coverage Care Coordinator Anticipated Follow Up Needs: HH/OP  Clinical Impression Pleasant female who is motivated to recover form her health scare and get back home. Her husband and Mom are involved and supportive and will be assisting at discharge. Will await therapy evaluations and work on discharge needs. May benefit from neuro-psych  while here  [name] 12/10/2021, 9:18 AM

## 2021-12-10 NOTE — Evaluation (Signed)
Speech Language Pathology Assessment and Plan  Patient Details  Name: Kara [name] MRN: [id number] Date of Birth: [birth year]  SLP Diagnosis: Cognitive Impairments  Rehab Potential: Excellent ELOS: 7-10 days   Today's Date: 12/10/2021 SLP Individual Time: 0903-1000 SLP Individual Time Calculation (min): 73 min  Hospital Problem: Principal Problem:   Anoxic brain injury [facility])  Past Medical History:  Past Medical History:  Diagnosis Date   Alcoholism in remission ([place])    per pt in remission since 2017   Anemia    AR (allergic rhinitis)    allergist-- dr Neldon Mc   Biliary dyskinesia    Diastolic CHF ([place])    followd by dr Mamie Nick. Johnsie Cancel   Environmental allergies    GERD (gastroesophageal reflux disease)    Headache    History of acute pancreatitis    alcoholic pancreatitis ,  8144 and 2017   History of sepsis 06/12/2016   with CAP   History of syncope    History of thrombosis 01/29/2015   splenic vein thrombosis-- treated with coumadin--- readmitted for abd. hematoma due to coumadin treated with IR embolization of the IMA branches   Hypertension    Polyneuropathy    Prolonged QT syndrome    cardiologist-- dr Johnsie Cancel (notes in epic)   Severe persistent asthma    pulmonologist-- dr dr Bernita Raisin (notes in epic)   Past Surgical History:  Past Surgical History:  Procedure Laterality Date   CHOLECYSTECTOMY N/A 06/21/2018   Procedure: LAPAROSCOPIC CHOLECYSTECTOMY;  Surgeon: Coralie Keens, MD;  Location: [facility];  Service: General;  Laterality: N/A;   ESOPHAGOGASTRODUODENOSCOPY N/A 07/17/2014   Procedure: ESOPHAGOGASTRODUODENOSCOPY (EGD);  Surgeon: Teena Irani, MD;  Location: [facility];  Service: Endoscopy;  Laterality: N/A;   TYMPANOSTOMY TUBE PLACEMENT Bilateral 11 MONTHS OLD   VIDEO BRONCHOSCOPY Bilateral 10/08/2016   Procedure: VIDEO BRONCHOSCOPY WITH FLUORO;  Surgeon: Marshell Garfinkel, MD;  Location: [place];  Service: Cardiopulmonary;  Laterality: Bilateral;     Assessment / Plan / Recommendation Clinical Impression Kara [name] is a 36 year old female with history of HTN, diastolic CHF, severe asthma, alcohol abuse-quit 2017, pancreatitis on creon, seizure disorder, sensory polyneuropathy, recent septic bursitis right knee;  who was originally admitted on 11/04/21 after found pulseless and apneic with suspected downtime of about 20-30 minutes. Cardiology felt that  cardiac arrest was secondary to be due to VT/VF in setting of QT prolongation and electrolyte abnormality due to severe hypokalemia.  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 She was reintubated due to respiratory decline, required tracheostomy 08/09 by Dr. Tacy Learn with ATC trials. Cortak placed for nutritional support 7/23.  She continued to have bouts of agitation alternating with lethargy question due to withdrawal from narcotics. She tolerated vent wean and was decannulated by 08/20. Mentation has greatly improved and she is making great gains with therapy. She continues to be limited by cognitive deficits, ataxic gait, weakness and chest wall pain. CIR recommended due to functional decline.   Pt presents with mild cognitive-linguistic deficits as characterized by decreased complex problem solving, processing, and short-term memory. Orientation, attention, basic problem solving, and insight/awareness appeared [facility] and perceived as strengths. Pt reports having faint memory from ICU but has overall mental status has significantly improved within the last week per pt/mother. Expressive/receptive language skills appear [facility] for  all tasks assessed. Speech is intelligible at the conversational level without concern for dysarthria.  Voice perceived as normal quality and intensity. Pt reports tolerating a regular diet with thin liquids without difficulty. [facility] Mental Status Examination was completed to evaluate the pt's cognitive-linguistic skills. Pt achieved a score of 21/30 which is below the normal limits of 27 or more out of 30 and is suggestive of a mild impairment. SLP services are recommended to address mild cognitive-linguistic deficits to maximize functional independence, especially while considering pt's high prior level of functioning and level of independence. Pt/mom verbalized understanding and agreement.    Skilled Therapeutic Interventions          Pt participated in [place] [employer] Status Examination (SLUMS) as well as further non-standardized assessments of cognitive-linguistic, speech, and language function. Please see above.    SLP Assessment  Patient will need skilled [facility] during CIR admission    Recommendations  Patient destination: Home Follow up Recommendations: Outpatient SLP Equipment Recommended: None recommended by SLP    SLP Frequency 3 to 5 out of 7 days   SLP Duration  SLP Intensity  SLP Treatment/Interventions 7-10 days  Minumum of 1-2 x/day, 30 to 90 minutes  Cognitive remediation/compensation;Patient/family education;Therapeutic Activities;Functional tasks;Internal/external aids    Pain Pain Assessment Pain Scale: 0-10 Pain Score: 4  Faces Pain Scale: Hurts a little bit Pain Type: Acute pain Pain Location: Chest PAINAD (Pain Assessment in Advanced Dementia) Breathing: normal Negative Vocalization: none Body Language: relaxed Consolability: no need to console  Prior Functioning Cognitive/Linguistic Baseline: Within functional limits Type of Home: House  Lives With: Spouse Available Help at Discharge: Available 24 hours/day;Family Education: Master's x2 Vocation: Full time employment  SLP  Evaluation Cognition Overall Cognitive Status: Impaired/Different from baseline Arousal/Alertness: Awake/alert Orientation Level: Oriented X4 Year: 2023 Month: August Day of Week: Correct Memory: Impaired Memory Impairment: Decreased recall of new information Awareness: Appears intact Problem Solving: Impaired Problem Solving Impairment: Verbal complex Safety/Judgment: Appears intact  Comprehension Auditory Comprehension Overall Auditory Comprehension: Appears within functional limits for tasks assessed Expression Expression Primary Mode of Expression: Verbal Verbal Expression Overall Verbal Expression: Appears within functional limits for tasks assessed Oral Motor Oral Motor/Sensory Function Overall Oral Motor/Sensory Function: Within functional limits  Care Tool Care Tool Cognition Ability to hear (with hearing aid or hearing appliances if normally used Ability to hear (with hearing aid or hearing appliances if normally used): 0. Adequate - no difficulty in normal conservation, social interaction, listening to TV   Expression of Ideas and Wants Expression of Ideas and Wants: 4. Without difficulty (complex and basic) - expresses complex messages without difficulty and with speech that is clear and easy to understand   Understanding Verbal and Non-Verbal Content Understanding Verbal and Non-Verbal Content: 4. Understands (complex and basic) - clear comprehension without cues or repetitions  Memory/Recall Ability Memory/Recall Ability : That he or she is in a hospital/hospital unit;Current season   Short Term Goals: Week 1: SLP Short Term Goal 1 (Week 1): STG=LTG due to ELOS  Refer to Care Plan for Long Term Goals  Recommendations for other services: Neuropsych  Discharge Criteria: Patient will be discharged from SLP if patient refuses treatment 3 consecutive times without medical reason, if treatment goals not met, if there is a change in medical status, if patient makes no  progress towards goals or if patient is discharged from hospital.  The above assessment, treatment plan, treatment alternatives and goals were discussed and  mutually agreed upon: by patient and by family  Patty Sermons 12/10/2021, 4:36 PM

## 2021-12-10 NOTE — Evaluation (Addendum)
Occupational Therapy Assessment and Plan  Patient Details  Name: Kara [name] MRN: [id number] Date of Birth: 1986-01-28  OT Diagnosis: abnormal posture, cognitive deficits, and muscle weakness (generalized) Rehab Potential:   ELOS:     Today's Date: 12/11/2021 OT Individual Time:  1300-1415 75 min        Hospital Problem: Principal Problem:   Anoxic brain injury [facility])   Past Medical History:  Past Medical History:  Diagnosis Date   Alcoholism in remission ([name])    per pt in remission since 2017   Anemia    AR (allergic rhinitis)    allergist-- dr Kara [name]   Biliary dyskinesia    Diastolic CHF ([place])    followd by dr Kara Nick. [name]   Environmental allergies    GERD (gastroesophageal reflux disease)    Headache    History of acute pancreatitis    alcoholic pancreatitis ,  2297 and 2017   History of sepsis 06/12/2016   with CAP   History of syncope    History of thrombosis 01/29/2015   splenic vein thrombosis-- treated with coumadin--- readmitted for abd. hematoma due to coumadin treated with IR embolization of the IMA branches   Hypertension    Polyneuropathy    Prolonged QT syndrome    cardiologist-- dr Kara [name] (notes in epic)   Severe persistent asthma    pulmonologist-- dr dr Bernita Raisin (notes in epic)   Past Surgical History:  Past Surgical History:  Procedure Laterality Date   CHOLECYSTECTOMY N/A 06/21/2018   Procedure: LAPAROSCOPIC CHOLECYSTECTOMY;  Surgeon: Kara Keens, MD;  Location: [facility];  Service: General;  Laterality: N/A;   ESOPHAGOGASTRODUODENOSCOPY N/A 07/17/2014   Procedure: ESOPHAGOGASTRODUODENOSCOPY (EGD);  Surgeon: Kara Irani, MD;  Location: [name];  Service: Endoscopy;  Laterality: N/A;   TYMPANOSTOMY TUBE PLACEMENT Bilateral 11 MONTHS OLD   VIDEO BRONCHOSCOPY Bilateral 10/08/2016   Procedure: VIDEO BRONCHOSCOPY WITH FLUORO;  Surgeon: Kara Garfinkel, MD;  Location: [name];  Service: Cardiopulmonary;  Laterality: Bilateral;    Assessment  & Plan Clinical Impression:  Patient is a 36 y.o. year old female with history of HTN, diastolic CHF, severe asthma, alcohol abuse-quit 2017, pancreatitis on creon, seizure disorder, sensory polyneuropathy, recent septic bursitis right knee;  who was originally admitted on 11/04/21 after found pulseless and apneic with suspected downtime of about 20-30 minutes. She was found by her husband who started CPR. She was found to be in VT and treated with ACLS protocol with ROSC. EKG showed ST changes with elevated troponin felt to be due to cardiac arrest. Cardiology felt that  cardiac arrest was secondary to be due to VT/VF in setting of QT prolongation and electrolyte abnormality due to severe hypokalemia.  She reports she was also recently started on Kara [name] at the time. 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 Patient with decreased LOC and care team discussed poor overall prognosis due to severe anoxic BI. Family elected full scope of care. She was extubated without difficulty but had issues with agitation requiring sedation. She was reintubated due to respiratory decline, required tracheostomy 08/09 by Dr. Tacy [name] with Kara [name] trials. Cortak placed for nutritional support 7/23.  She continued to have bouts of agitation alternating with lethargy question due to withdrawal from narcotics. She was evaluated by Dr. Sophronia [name] who felt that she was not a  candidate for intervention and to follow up in 2-3 months.     She was transferred to [name] on 11/26/21 for vent wean and medical management.  She tolerated vent wean and was decannulated by 08/20. She developed leucocytosis with cough and was was started on Kara [name], Kara [name] and Kara [name] for PNA but developed rash  therefore this was d/c after 5 day course of treatment.  Kara [name] later de-escalated to Kara [name].  Respiratory status has been stable and swallow function has improved. She is tolerating regular diet with meds crushed in puree. Anxiety managed with Kara [name] and chest wall pain being treated with oxycodone.   2D echo done for work up of pain 08/21 revealing EF 60-65% and no wall abnormality.  Hypomagnesemia continues to be an issue with Mg-1.4 today which was treated with IV magnesium. Rash has resolved with dose of solumedrol but she continues to have itching which is managed with prn benadryl and solumedrol.  Mentation has greatly improved and she is making great gains with therapy.  She has chronic pain due to pancreatitis followed by Kara [name] as an outpatient. She says she takes oxycodone $RemoveBeforeD'15mg'YhlSMGgVtexkjg$  for this pain as an outpatient. She also has chronic peripheral polyneuroapthy managed with gabapentin.  She continues to be limited by cognitive deficits, ataxic gait, weakness and chest wall pain. CIR recommended due to functional decline. Patient transferred to CIR on 12/09/2021 .   Patient currently requires min with basic self-care skills and IADL secondary to muscle weakness, decreased cardiorespiratoy endurance, decreased coordination and decreased motor planning, decreased attention to right, decreased memory and delayed processing, and decreased standing balance and decreased balance strategies.  Prior to hospitalization, patient could complete all aspects of ADL's and IADL's and working FT- pt did not drive due to sz with independent .  Patient will benefit from skilled intervention to decrease level of assist with basic self-care skills, increase independence with basic self-care skills, and increase level of independence with iADL prior to discharge home with care partner.  Anticipate patient will require 24 hour supervision and follow up outpatient.  OT Evaluation Precautions/Restrictions   Precautions Precautions: None Precaution Comments: Watch HR and O2 sats, Cortrak, trach, flexiseal Restrictions Weight Bearing Restrictions: No General Chart Reviewed: Yes Family/Caregiver Present: Yes Vital Signs Therapy Vitals Temp: 97.9 F (36.6 C) Temp Source: Oral Pulse Rate: 64 Resp: 18 BP: 104/63 Patient Position (if appropriate): Lying Oxygen Therapy SpO2: 94 % O2 Device: Room Air Pain Pain Assessment Pain Scale: 0-10 Pain Score: 0-No pain Home Living/Prior Functioning Home Living Family/patient expects to be discharged to:: Private residence Living Arrangements: Spouse/significant other Available Help at Discharge: Available 24 hours/day, Family Type of Home: House Home Access: Stairs to enter Technical brewer of Steps: 2 Entrance Stairs-Rails: None Home Layout: Two level, Able to live on main level with bedroom/bathroom Alternate Level Stairs-Number of Steps: 10 Alternate Level Stairs-Rails: Right Bathroom Shower/Tub: Multimedia programmer: Standard Bathroom Accessibility: Yes Additional Comments: was as [facility] for knee hematoma Wed-Sat 7/12-7/15, works as a Systems analyst at [employer] With: Spouse IADL History Homemaking Responsibilities: Yes Meal Prep Responsibility: Therapist, occupational Responsibility: Primary Cleaning Responsibility: Primary Current License: No Education: Warden/ranger Occupation: Full time employment Type of Occupation: [employer] Leisure and Hobbies: flute, dogs Prior Function Level of Independence: Independent with gait, Independent with homemaking with ambulation, Independent with transfers, Independent with basic ADLs  Able to Take Stairs?: Yes Driving: No Vocation: Full time employment Leisure: Hobbies-yes (Comment) Vision Baseline Vision/History: 0 No visual  deficits Ability to See in Adequate Light: 0 Adequate Vision Assessment?: No apparent visual deficits Perception  Perception: Impaired Inattention/Neglect:   (R sided inattention is mild) Praxis Praxis: Intact Cognition Cognition Overall Cognitive Status: Impaired/Different from baseline Arousal/Alertness: Awake/alert Orientation Level: Person;Place;Situation Person: Oriented Place: Oriented Situation: Oriented Memory: Impaired Memory Impairment: Decreased recall of new information Attention: Alternating;Divided Divided Attention: Impaired Divided Attention Impairment: Functional complex;Verbal complex Awareness: Impaired Awareness Impairment: Anticipatory impairment Problem Solving: Impaired Problem Solving Impairment: Verbal complex Safety/Judgment: Appears intact Brief Interview for Mental Status (BIMS) Repetition of Three Words (First Attempt): 3 Temporal Orientation: Year: Correct Temporal Orientation: Month: Accurate within 5 days Temporal Orientation: Day: Correct Recall: "Sock": Yes, no cue required Recall: "Blue": Yes, no cue required Recall: "Bed": Yes, no cue required BIMS Summary Score: 15 Sensation Sensation Peripheral sensation comments: diminshed sensastion left L5 Light Touch Impaired Details: Impaired LLE Hot/Cold: Appears Intact Proprioception: Appears Intact Stereognosis: Appears Intact Coordination Gross Motor Movements are Fluid and Coordinated: Yes Fine Motor Movements are Fluid and Coordinated: Yes Finger Nose Finger Test: intact Heel Shin Test: intact 9 Hole Peg Test: 23 sec; R 26 sec L Motor  Motor Motor: Within Functional Limits Motor - Skilled Clinical Observations: mild LE weakness especially in quads  Trunk/Postural Assessment  Cervical Assessment Cervical Assessment: Within Functional Limits Thoracic Assessment Thoracic Assessment: Within Functional Limits Lumbar Assessment Lumbar Assessment: Within Functional Limits Postural Control Postural Control: Within Functional Limits  Balance Balance Balance Assessed: Yes Static Sitting Balance Static Sitting - Balance Support: Feet  supported Static Sitting - Level of Assistance: 5: Stand by assistance Dynamic Sitting Balance Dynamic Sitting - Balance Support: During functional activity;Feet supported Static Standing Balance Static Standing - Balance Support: During functional activity;No upper extremity supported Static Standing - Level of Assistance: 5: Stand by assistance Dynamic Standing Balance Dynamic Standing - Balance Support: During functional activity;No upper extremity supported Dynamic Standing - Level of Assistance: 5: Stand by assistance Dynamic Standing - Balance Activities: Lateral lean/weight shifting;Reaching across midline;Reaching for objects Dynamic Standing - Comments: dereased SLS Extremity/Trunk Assessment RUE Assessment RUE Assessment: Within Functional Limits LUE Assessment LUE Assessment: Within Functional Limits  Care Tool Care Tool Self Care   12/10/21 2226  CareTool - Eating  Eating Assist Level Set up assist  CareTool - Oral Care  Oral Care Assist Level Set up assist  CareTool - Bathing  Body parts bathed by patient Right arm;Chest;Left arm;Abdomen;Front perineal area;Buttocks;Right upper leg;Left upper leg;Right lower leg;Left lower leg;Face  Assist Level Minimal Assistance - Patient > 75%  CareTool- Upper Body Dressing (including orthotics  What is the patient wearing? Pull over shirt  Assist Level Contact Guard/Touching assist  CareTool - Lower Body Dressing (excluding footwear)  What is the patient wearing? Pants  Assist for lower body dressing Minimal Assistance - Patient > 75%  CareTool - Putting on/Taking off footwear  What is the patient wearing? Socks;Shoes  Assist for footwear Moderate Assistance - Patient 50 - 74%     12/10/21 2227  CareTool - Toileting  Assist for toileting Minimal Assistance - Patient > 75%  CareTool - Bed Mobility  Roll left and right assist level Supervision/Verbal cueing  Sit to lying assist level Supervision/Verbal cueing  Lying to  sitting on side of bed assist level: the ability to move from lying on the back to sitting on the side of the bed with no back support. Supervision/Verbal cueing  CareTool - Sit to stand transfer  Sit to stand assist level Supervision/Verbal cueing  CareTool - Chair/bed transfer  Chair/bed transfer assist level Supervision/Verbal cueing  CareTool - Toilet Transfers  Assist Level Contact Guard/Touching assist  CareTool - Psychologist, sport and exercise transfer assist level Supervision/Verbal cueing  CareTool - Locomotion: Ambulation  Assist level Contact Guard/Touching assist  Assistive device No Device  Max distance >300 feet    Care Tool Cognition   12/10/21 2228  CareTool - Hearing  Ability to hear (with hearing aid or hearing appliances if normally used) 0. Adequate - no difficulty in normal conservation, social interaction, listening to TV  CareTool - Expression  Expression of Ideas and Wants 4. Without difficulty (complex and basic) - expresses complex messages without difficulty and with speech that is clear and easy to understand  CareTool - Comprehension  Understanding Verbal and Non-Verbal Content 4. Understands (complex and basic) - clear comprehension without cues or repetitions  CareTool - Memory  Memory/Recall Ability  That he or she is in a hospital/hospital unit;Current season  CareTool - Signs and Symptoms of Delirium (from CAM)  Is there evidence of an acute change in mental status from the patient's baseline? 0 No  Inattention 0 Behavior not present  Disorganized thinking 0 Behavior not present  Altered level of consciousness  0 Behavior not present  Positive CAM assessment intervention/preventative measures Universal precautions (preventative) measures initiated      12/10/21 2231  OT - End of Session  Activity Tolerance Tolerates 10 - 20 min activity with multiple rests  Endurance Deficit Yes  Endurance Deficit Description intermittent rests required between activity   OT Assessment  Rehab Potential (ACUTE ONLY) Excellent  OT Patient demonstrates impairments in the following area(s) Balance;Cognition;Endurance;Safety;Motor;Perception  OT Basic ADL's Functional Problem(s) Grooming;Bathing;Dressing;Toileting  OT Advanced ADL's Functional Problem(s) Simple Meal Preparation;Light Housekeeping;Laundry  OT Transfers Functional Problem(s) Toilet;Tub/Shower  OT Additional Impairment(s) None  OT Plan  OT Intensity Minimum of 1-2 x/day, 45 to 90 minutes  OT Frequency 5 out of 7 days  OT Duration/Estimated Length of Stay 5-7 days  OT Treatment/Interventions Balance/vestibular training;Cognitive remediation/compensation;Community reintegration;Discharge planning;Disease mangement/prevention;DME/adaptive equipment instruction;Functional mobility training;Neuromuscular re-education;Patient/family education;Psychosocial support;Self Care/advanced ADL retraining;Therapeutic Activities;Therapeutic Exercise;UE/LE Strength taining/ROM;UE/LE Coordination activities;Visual/perceptual remediation/compensation  OT Self Feeding Anticipated Outcome(s) indep  OT Basic Self-Care Anticipated Outcome(s) mod I  OT Toileting Anticipated Outcome(s) mod I  OT Bathroom Transfers Anticipated Outcome(s) mod I  OT Recommendation  Patient destination Home  Follow Up Recommendations Outpatient OT;24 hour supervision/assistance  Equipment Recommended None recommended by OT  Equipment Details family to obtain reacher and shower stool  Individuals Consulted  Consulted and Agree with Results and Recommendations Family member/caregiver  Family Member Consulted husband and mother   Refer to Care Plan for Long Term Goals  SHORT TERM GOAL WEEK 1 OT Short Term Goal 1 (Week 1): Pt will be indep with all self care incl showers with DME/AE OT Short Term Goal 2 (Week 1): Pt wil be indep to set up ADL routine wiht LRAD OT Short Term Goal 3 (Week 1): Pt will be indep for functioanl cognitive tasks such  as map reading, grocery list planning OT Short Term Goal 4 (Week 1): Pt will be indep with simple meal prep, light laundry and item retrieval in apartment space OT Short Term Goal 5 (Week 1): Pt will require S for in and out of tub as pt prefers tub bath  Recommendations for other services: Neuropsych   Skilled Therapeutic Intervention ADL ADL Eating: Independent Grooming: Supervision/safety Where Assessed-Grooming: Standing at sink Upper Body Bathing: Contact guard  Where Assessed-Upper Body Bathing: Sitting at sink;Standing at sink Lower Body Bathing: Contact guard Where Assessed-Lower Body Bathing: Sitting at sink;Standing at sink Upper Body Dressing: Supervision/safety Where Assessed-Upper Body Dressing: Sitting at sink Lower Body Dressing: Contact guard Where Assessed-Lower Body Dressing: Sitting at sink;Standing at sink;Chair Toileting: Contact guard Where Assessed-Toileting: Glass blower/designer: Lawyer: Energy manager: Medical sales representative Method: Librarian, academic: Energy manager: Curator Method: Heritage manager: Grab bars ADL Comments: Increased time and effort, cues for R sided awareness and slower processing, limited activity tolerance and high level balance. Mobility  Bed Mobility Bed Mobility: Rolling Right;Rolling Left;Supine to Sit;Sit to Supine;Sitting - Scoot to [employer] of Bed Rolling Right: Supervision/verbal cueing Rolling Left: Supervision/Verbal cueing Supine to Sit: Supervision/Verbal cueing Sitting - Scoot to Edge of Bed: Supervision/Verbal cueing Sit to Supine: Supervision/Verbal cueing Transfers Sit to Stand: Supervision/Verbal cueing Stand to Sit: Supervision/Verbal cueing Treatment/Interventions: Pt seen for full initial OT evaluation and training session this am. Pt in bed upon OT arrival. OT  introduced role of therapy and purpose of session. Pt's husband and mother bedside for education and history.  Pt  open to all presented assessment and training this visit. Pt requested toileting and was able to move from bed to EOB with CGA and amb with CGA without AD to toilet. OT provided set up to complete peri hygiene and required close S.  OT assisted and assessed ADL's, mobility, vision, sensation. cognition/lang, G/FMC, strength and balance throughout session. See above for levels. Pt with some delayed processing, mild STM deficits, R inattention with negotiating obstacles and doorways and decreased balance and activity tolerance. Pt will benefit from skilled OT services at CIR to maximize function and safety with recommendation to return home with mod I for BADL's and S for higher level activity with outpt OT services upon d/c home. Pt left at end of session with family in recliner with LE's elevated and chair alarm set, tray table and nurse call bell within reach.   Discharge Criteria: Patient will be discharged from OT if patient refuses treatment 3 consecutive times without medical reason, if treatment goals not met, if there is a change in medical status, if patient makes no progress towards goals or if patient is discharged from hospital.  The above assessment, treatment plan, treatment alternatives and goals were discussed and mutually agreed upon: by patient and by family  Barnabas Lister 12/11/2021, 8:18 AM

## 2021-12-10 NOTE — Progress Notes (Signed)
Inpatient Rehabilitation Center Individual Statement of Services  Patient Name:  Kara [name]  Date:  12/10/2021  Welcome to the Inpatient Rehabilitation Center.  Our goal is to provide you with an individualized program based on your diagnosis and situation, designed to meet your specific needs.  With this comprehensive rehabilitation program, you will be expected to participate in at least 3 hours of rehabilitation therapies Monday-Friday, with modified therapy programming on the weekends.  Your rehabilitation program will include the following services:  Physical Therapy (PT), Occupational Therapy (OT), Speech Therapy (ST), 24 hour per day rehabilitation nursing, Therapeutic Recreaction (TR), Neuropsychology, Care Coordinator, Rehabilitation Medicine, Nutrition Services, and Pharmacy Services  Weekly team conferences will be held on Tuesday to discuss your progress.  Your Inpatient Rehabilitation Care Coordinator will talk with you frequently to get your input and to update you on team discussions.  Team conferences with you and your family in attendance may also be held.  Expected length of stay: 5-7 days  Overall anticipated outcome: independent-supervision level  Depending on your progress and recovery, your program may change. Your Inpatient Rehabilitation Care Coordinator will coordinate services and will keep you informed of any changes. Your Inpatient Rehabilitation Care Coordinator's name and contact numbers are listed  below.  The following services may also be recommended but are not provided by the Inpatient Rehabilitation Center:  Driving Evaluations Home Health Rehabiltiation Services Outpatient Rehabilitation Services Vocational Rehabilitation   Arrangements will be made to provide these services after discharge if needed.  Arrangements include referral to agencies that provide these services.  Your insurance has been verified to be:  [employer] Your primary doctor is:   Gildardo Cranker  Pertinent information will be shared with your doctor and your insurance company.  Inpatient Rehabilitation Care Coordinator:  Kara [name], [facility]ara [name] [phone] or [name]  Information discussed with and copy given to patient by: Kara [name], 12/10/2021, 9:20 AM

## 2021-12-10 NOTE — Progress Notes (Signed)
Physical Therapy Assessment and Plan  Patient Details  Name: Kara [name] MRN: [id number] Date of Birth: 1985/08/26  PT Diagnosis: Abnormality of gait, Impaired sensation, Low back pain, and Muscle weakness Rehab Potential: Excellent ELOS: 3-5 days   Today's Date: 12/10/2021 PT Individual Time: 1047-1208 PT Individual Time Calculation (min): 81 min    Hospital Problem: Principal Problem:   Anoxic brain injury [facility])   Past Medical History:  Past Medical History:  Diagnosis Date   Alcoholism in remission ([place])    per pt in remission since 2017   Anemia    AR (allergic rhinitis)    allergist-- dr Neldon Mc   Biliary dyskinesia    Diastolic CHF ([place])    followd by dr Mamie Nick. Johnsie Cancel   Environmental allergies    GERD (gastroesophageal reflux disease)    Headache    History of acute pancreatitis    alcoholic pancreatitis ,  2202 and 2017   History of sepsis 06/12/2016   with CAP   History of syncope    History of thrombosis 01/29/2015   splenic vein thrombosis-- treated with coumadin--- readmitted for abd. hematoma due to coumadin treated with IR embolization of the IMA branches   Hypertension    Polyneuropathy    Prolonged QT syndrome    cardiologist-- dr Johnsie Cancel (notes in epic)   Severe persistent asthma    pulmonologist-- dr dr Bernita Raisin (notes in epic)   Past Surgical History:  Past Surgical History:  Procedure Laterality Date   CHOLECYSTECTOMY N/A 06/21/2018   Procedure: LAPAROSCOPIC CHOLECYSTECTOMY;  Surgeon: Coralie Keens, MD;  Location: [facility];  Service: General;  Laterality: N/A;   ESOPHAGOGASTRODUODENOSCOPY N/A 07/17/2014   Procedure: ESOPHAGOGASTRODUODENOSCOPY (EGD);  Surgeon: Teena Irani, MD;  Location: [facility];  Service: Endoscopy;  Laterality: N/A;   TYMPANOSTOMY TUBE PLACEMENT Bilateral 11 MONTHS OLD   VIDEO BRONCHOSCOPY Bilateral 10/08/2016   Procedure: VIDEO BRONCHOSCOPY WITH FLUORO;  Surgeon: Marshell Garfinkel, MD;  Location: [facility];  Service:  Cardiopulmonary;  Laterality: Bilateral;    Assessment & Plan Clinical Impression: Patient is a 36 y.o. year old female with history of HTN, diastolic CHF, severe asthma, alcohol abuse-quit 2017, pancreatitis on creon, seizure disorder, sensory polyneuropathy, recent septic bursitis right knee;  who was originally admitted on 11/04/21 after found pulseless and apneic with suspected downtime of about 20-30 minutes. She was found by her husband who started CPR. She was found to be in VT and treated with ACLS protocol with ROSC. EKG showed ST changes with elevated troponin felt to be due to cardiac arrest. Cardiology felt that  cardiac arrest was secondary to be due to VT/VF in setting of QT prolongation and electrolyte abnormality due to severe hypokalemia.  She reports she was also recently started on Seroquel at the time. 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 Patient with decreased LOC and care team discussed poor overall prognosis due to severe anoxic BI. Family elected full scope of care. She was extubated without difficulty but had issues with agitation requiring sedation. She was reintubated due to respiratory decline, required tracheostomy 08/09 by Dr. Tacy Learn with ATC trials. Cortak placed for nutritional support 7/23.  She continued to have bouts of agitation alternating with lethargy question due to withdrawal from narcotics. She was evaluated by Dr. Sophronia Simas who felt that she was not  a candidate for intervention and to follow up in 2-3 months.     She was transferred to [facility] on 11/26/21 for vent wean and medical management.  She tolerated vent wean and was decannulated by 08/20. She developed leucocytosis with cough and was was started on  Vanc, Flagyl and cefepime for PNA but developed rash therefore this was d/c after 5 day course of treatment.  Cefepime later de-escalated to Rocephin.  Respiratory status has been stable and swallow function has improved. She is tolerating regular diet with meds crushed in puree. Anxiety managed with Klonopin and chest wall pain being treated with oxycodone.   2D echo done for work up of pain 08/21 revealing EF 60-65% and no wall abnormality.  Hypomagnesemia continues to be an issue with Mg-1.4 today which was treated with IV magnesium. Rash has resolved with dose of solumedrol but she continues to have itching which is managed with prn benadryl and solumedrol.  Mentation has greatly improved and she is making great gains with therapy.  She has chronic pain due to pancreatitis followed by [facility] as an outpatient. She says she takes oxycodone $RemoveBeforeD'15mg'[id number]$  for this pain as an outpatient. She also has chronic peripheral polyneuroapthy managed with gabapentin.  She continues to be limited by cognitive deficits, ataxic gait, weakness and chest wall pain. CIR recommended due to functional decline. Patient transferred to CIR on 12/09/2021 .   Patient currently requires supervision with mobility secondary to muscle weakness, decreased coordination, and decreased balance strategies.  Prior to hospitalization, patient was independent  with mobility and lived with Spouse in a House home.  Home access is 2Stairs to enter.  Patient will benefit from skilled PT intervention to maximize safe functional mobility, minimize fall risk, and decrease caregiver burden for planned discharge home with 24 hour supervision.  Anticipate patient will benefit from follow up OP at discharge.  PT - End of Session Activity Tolerance: Tolerates 30+ min activity with multiple rests Endurance Deficit: No PT Assessment Rehab Potential (ACUTE/IP ONLY): Excellent PT Barriers to Discharge: Inaccessible home environment PT Barriers to  Discharge Comments: house, spouse, 2 level with 2 steps to enter with no rails and 10 steps with right rail to access 2nd level. able to live on main level PT Patient demonstrates impairments in the following area(s): Sensory;Motor;Balance PT Transfers Functional Problem(s): Bed Mobility;Bed to Chair;Car PT Locomotion Functional Problem(s): Ambulation PT Plan PT Intensity: Minimum of 1-2 x/day ,45 to 90 minutes PT Frequency: 5 out of 7 days PT Duration Estimated Length of Stay: 3-5 days PT Treatment/Interventions: Ambulation/gait training;Discharge planning;Functional mobility training;Psychosocial support;Pain management;Therapeutic Activities;UE/LE Strength taining/ROM;Balance/vestibular training;Community reintegration;Disease management/prevention;Neuromuscular re-education;Patient/family education;Stair training;Therapeutic Exercise;UE/LE Coordination activities PT Transfers Anticipated Outcome(s): Independent PT Locomotion Anticipated Outcome(s): Independent PT Recommendation Recommendations for Other Services: Other (comment) (TBD) Follow Up Recommendations: Outpatient PT Patient destination: Home Equipment Recommended: None recommended by PT Equipment Details: None   PT Evaluation Precautions/Restrictions Precautions Precautions: None Restrictions Weight Bearing Restrictions: No  Pain Interference Pain Interference Pain Effect on Sleep: 2. Occasionally Pain Interference with Therapy Activities: 4. Almost constantly Pain Interference with Day-to-Day Activities: 2. Occasionally Home Living/Prior Functioning Home Living Living Arrangements: Spouse/significant other Available Help at Discharge: Available 24 hours/day;Family Type of Home: House Home Access: Stairs to enter [employer] of Steps: 2 Entrance Stairs-Rails: None Home Layout: Two level;Able to live on main level with bedroom/bathroom Alternate Level Stairs-Number of Steps: 10 Alternate Level  Stairs-Rails: Right Bathroom Shower/Tub: Multimedia programmer: Standard Bathroom Accessibility: Yes  Lives With: Spouse Prior Function Level of Independence: Independent with gait;Independent with homemaking with ambulation;Independent with transfers;Independent with basic ADLs  Able to Take Stairs?: Yes Driving: No (did not drive due to seizures) Vocation: Full time employment Leisure: Hobbies-yes (Comment) (playing with dogs, classical music) Cognition Overall Cognitive Status: Within Functional Limits for tasks assessed Arousal/Alertness: Awake/alert Orientation Level: Oriented X4 Year: 2023 Month: August Day of Week: Correct Memory: Impaired Memory Impairment: Decreased long term memory Awareness: Appears intact Problem Solving: Appears intact Safety/Judgment: Appears intact Sensation Sensation Light Touch: Impaired Detail Peripheral sensation comments: diminshed sensastion left L5 Light Touch Impaired Details: Impaired LLE Proprioception: Appears Intact Coordination Gross Motor Movements are Fluid and Coordinated: Yes Fine Motor Movements are Fluid and Coordinated: Yes Finger Nose Finger Test: intact Heel Shin Test: intact Motor  Motor Motor: Within Functional Limits Motor - Skilled Clinical Observations: mild LE weakness   Trunk/Postural Assessment  Cervical Assessment Cervical Assessment: Within Functional Limits Thoracic Assessment Thoracic Assessment: Within Functional Limits Lumbar Assessment Lumbar Assessment: Within Functional Limits Postural Control Postural Control: Within Functional Limits  Balance Balance Balance Assessed: Yes Standardized Balance Assessment Standardized Balance Assessment: Berg Balance Test Berg Balance Test Sit to Stand: Able to stand without using hands and stabilize independently Standing Unsupported: Able to stand safely 2 minutes Sitting with Back Unsupported but Feet Supported on Floor or Stool: Able to sit  safely and securely 2 minutes Stand to Sit: Sits safely with minimal use of hands Transfers: Able to transfer safely, minor use of hands Standing Unsupported with Eyes Closed: Able to stand 10 seconds with supervision Standing Ubsupported with Feet Together: Able to place feet together independently and stand for 1 minute with supervision From Standing, Reach Forward with Outstretched Arm: Can reach confidently >25 cm (10") From Standing Position, Pick up Object from Floor: Able to pick up shoe, needs supervision From Standing Position, Turn to Look Behind Over each Shoulder: Looks behind from both sides and weight shifts well Turn 360 Degrees: Able to turn 360 degrees safely in 4 seconds or less Standing Unsupported, Alternately Place Feet on Step/Stool: Able to stand independently and safely and complete 8 steps in 20 seconds Standing Unsupported, One Foot in Front: Able to place foot tandem independently and hold 30 seconds Standing on One Leg: Able to lift leg independently and hold 5-10 seconds Total Score: 52 Static Sitting Balance Static Sitting - Balance Support: Feet supported Static Sitting - Level of Assistance: 5: Stand by assistance (supervision) Dynamic Sitting Balance Dynamic Sitting - Balance Support: During functional activity;Feet supported (supervision) Static Standing Balance Static Standing - Balance Support: During functional activity;No upper extremity supported Static Standing - Level of Assistance: 5: Stand by assistance (supervision) Dynamic Standing Balance Dynamic Standing - Balance Support: During functional activity;No upper extremity supported Dynamic Standing - Level of Assistance: 5: Stand by assistance (supervision) Dynamic Standing - Balance Activities: Lateral lean/weight shifting;Reaching across midline;Reaching for objects Extremity Assessment  RLE Assessment RLE Assessment: Exceptions to [facility] General Strength Comments: grossly 5/5 except hip flexion  3+ LLE Assessment LLE Assessment: Within Functional Limits General Strength Comments: grossly 5/5  Care Tool Care Tool Bed Mobility Roll left and right activity   Roll left and right assist level: Supervision/Verbal cueing    Sit to lying activity   Sit to lying assist level: Supervision/Verbal cueing    Lying to sitting on side of bed activity   Lying to sitting on side of bed assist level: the ability to move from lying on the  back to sitting on the side of the bed with no back support.: Supervision/Verbal cueing     Care Tool Transfers Sit to stand transfer   Sit to stand assist level: Supervision/Verbal cueing    Chair/bed transfer   Chair/bed transfer assist level: Supervision/Verbal cueing     Toilet transfer        Car transfer   Car transfer assist level: Supervision/Verbal cueing      Care Tool Locomotion Ambulation   Assist level: Contact Guard/Touching assist Assistive device: No Device Max distance: >300 feet  Walk 10 feet activity   Assist level: Contact Guard/Touching assist Assistive device: No Device   Walk 50 feet with 2 turns activity   Assist level: Contact Guard/Touching assist Assistive device: No Device  Walk 150 feet activity   Assist level: Contact Guard/Touching assist Assistive device: No Device  Walk 10 feet on uneven surfaces activity   Assist level: Contact Guard/Touching assist Assistive device: Other (comment) (None)  Stairs   Assist level: Contact Guard/Touching assist Stairs assistive device: 2 hand rails Max number of stairs: 12 (6 inches)  Walk up/down 1 step activity   Walk up/down 1 step (curb) assist level: Contact Guard/Touching assist Walk up/down 1 step or curb assistive device: 2 hand rails  Walk up/down 4 steps activity   Walk up/down 4 steps assist level: Contact Guard/Touching assist Walk up/down 4 steps assistive device: 2 hand rails  Walk up/down 12 steps activity   Walk up/down 12 steps assist level: Contact  Guard/Touching assist Walk up/down 12 steps assistive device: 2 hand rails  Pick up small objects from floor   Pick up small object from the floor assist level: Supervision/Verbal cueing Pick up small object from the floor assistive device: none  Wheelchair Is the patient using a wheelchair?: Yes Type of Wheelchair: Manual   Wheelchair assist level: Supervision/Verbal cueing Max wheelchair distance: 200 feet  Wheel 50 feet with 2 turns activity   Assist Level: Supervision/Verbal cueing  Wheel 150 feet activity   Assist Level: Supervision/Verbal cueing    Refer to Care Plan for Long Term Goals  SHORT TERM GOAL WEEK 1 PT Short Term Goal 1 (Week 1): STG=LTG due to ELOS  Recommendations for other services: None   Skilled Therapeutic Intervention  Pt received seated edge of bed with mother present and agreeable to PT session. Pt reports 6/10 chest and rib pain, pre-medicated. Pt declined ice or heat for pain relief.  PT educated pt and family regarding CIR policies and procedures. PT assessed pain interference, sensation, strength, balance, and coordination. Pt (S) with sit to stand and bed to w/c transfer. Pt propelled w/c to main gym and ambulated total of 300 ft with no AD and CGA for safety. Pt presents with narrowed based of support and decreased trunk rotation and able to self-correct with verbal cueing. Pt navigated 12  steps (6 inches) with 2 HR's  with reciprocal pattern and required CGA for safety. Pt ambulated to ortho gym and required (S) with car transfer to simulated truck height and CGA with ramp negotiation.  Pt (S) for verbal cueing with supine<>sit and rolling at mat table. Pt ambulated to dayroom CGA with verbal cues to wide base of support. Pt transported to main gym for time management and energy conservation by w/c. Pt participated in [place] balance assessment. Patient demonstrates increased fall risk as noted by score of  52 /56 on Berg Balance Scale.  (<36= high risk for  falls, close to 100%;  37-45 significant >80%; 46-51 moderate >50%; 52-55 lower >25%). Pt transported to room and left semi-reclined in bed with spouse and mother present with bed alarm on and all needs in reach. Safety plan updated.   Mobility Bed Mobility Bed Mobility: Rolling Right;Rolling Left;Supine to Sit;Sit to Supine;Sitting - Scoot to [employer] of Bed Rolling Right: Supervision/verbal cueing Rolling Left: Supervision/Verbal cueing Supine to Sit: Supervision/Verbal cueing Sitting - Scoot to Edge of Bed: Supervision/Verbal cueing Sit to Supine: Supervision/Verbal cueing Transfers Transfers: Sit to Stand;Stand to Sit;Stand Pivot Transfers Sit to Stand: Supervision/Verbal cueing Stand to Sit: Supervision/Verbal cueing Stand Pivot Transfers: Supervision/Verbal cueing Stand Pivot Transfer Details: Verbal cues for sequencing Transfer (Assistive device): None Locomotion  Gait Ambulation: Yes Gait Assistance: Contact Guard/Touching assist Gait Distance (Feet): 300 Feet (feet) Assistive device: None Gait Gait: Yes Gait Pattern: Impaired Gait Pattern: Narrow base of support Gait velocity: decreased Stairs / Additional Locomotion Stairs: Yes Stairs Assistance: Contact Guard/Touching assist Stair Management Technique: Two rails Number of Stairs: 12 Height of Stairs: 6 (inches) Ramp: Contact Guard/touching assist Curb: Nurse, mental health Mobility: Yes Wheelchair Assistance: Chartered loss adjuster: Both upper extremities Wheelchair Parts Management: Supervision/cueing Distance: 200 feet   Discharge Criteria: Patient will be discharged from PT if patient refuses treatment 3 consecutive times without medical reason, if treatment goals not met, if there is a change in medical status, if patient makes no progress towards goals or if patient is discharged from hospital.  The above assessment, treatment plan, treatment  alternatives and goals were discussed and mutually agreed upon: by patient and by family  Tanja Port PT, DPT  12/10/2021, 12:41 PM

## 2021-12-10 NOTE — Progress Notes (Signed)
PROGRESS NOTE   Subjective/Complaints:  Pt reports slept well once fell asleep, but usually likes to go to sleep earlier- took awhile with trazodone, but did get to sleep and stayed asleep.  LBM this AM. Loose- usually goes 3-4x/day due to pancreatic insufficiency/on Creon.    Itching a little better with hypoallergenic sheets and steroid she received.    Mg still dropping- had IV Mg yesterday evening.   ROS:  Pt denies SOB, abd pain, CP, N/V/C/D, and vision changes   Objective:   No results found. Recent Labs    12/10/21 0502  WBC 7.7  HGB 9.8*  HCT 29.3*  PLT 171   Recent Labs    12/09/21 0920 12/10/21 0502  NA 132* 138  K 5.0 3.6  CL 100 103  CO2 21* 24  GLUCOSE 447* 152*  BUN 8 9  CREATININE 0.75 0.68  CALCIUM 8.9 9.2    Intake/Output Summary (Last 24 hours) at 12/10/2021 1024 Last data filed at 12/10/2021 0847 Gross per 24 hour  Intake 580 ml  Output --  Net 580 ml        Physical Exam: Vital Signs Blood pressure 113/76, pulse 82, temperature 98.1 F (36.7 C), temperature source Oral, resp. rate 18, height 5\' 5"  (1.651 m), weight 93.9 kg, SpO2 96 %.   General: awake, alert, appropriate, initially standing with RW at sink; mother in room; asked pt to sit on chair that I brought her- she did; NAD HENT: conjugate gaze; oropharynx moist-wearing headphones CV: regular rate; no JVD Pulmonary: CTA B/L; no W/R/R- good air movement GI: soft, NT, ND, (+)BS Psychiatric: appropriate- full affect Neurological: alert- trace delayed responses, but overall very interactive and oriented Skin: warm and dry, healing I&D R infrapatella incision Neuro:  Alert and oriented x4, follows 2 step commands,makes eye contact with examiners,  can name president, not able to state her age correctly, unable to count down by 7s after 93, short and long term memory mildly abnormal, speech normal, sensation intact to LT in  all 4 extremities  FTN slightly decreased on Left Strength 5/5 in b/l UE and b/l LE Musculoskeletal:  No abnormal tone or ROM noted Claw toes b/l LE digits 2-4  Assessment/Plan: 1. Functional deficits which require 3+ hours per day of interdisciplinary therapy in a comprehensive inpatient rehab setting. Physiatrist is providing close team supervision and 24 hour management of active medical problems listed below. Physiatrist and rehab team continue to assess barriers to discharge/monitor patient progress toward functional and medical goals  Care Tool:  Bathing              Bathing assist       Upper Body Dressing/Undressing Upper body dressing        Upper body assist      Lower Body Dressing/Undressing Lower body dressing            Lower body assist       Toileting Toileting Toileting Activity did not occur (Clothing management and hygiene only): N/A (no void or bm)  Toileting assist       Transfers Chair/bed transfer  Transfers assist     Chair/bed transfer assist  level: Moderate Assistance - Patient 50 - 74%     Locomotion Ambulation   Ambulation assist              Walk 10 feet activity   Assist           Walk 50 feet activity   Assist           Walk 150 feet activity   Assist           Walk 10 feet on uneven surface  activity   Assist           Wheelchair     Assist               Wheelchair 50 feet with 2 turns activity    Assist            Wheelchair 150 feet activity     Assist          Blood pressure 113/76, pulse 82, temperature 98.1 F (36.7 C), temperature source Oral, resp. rate 18, height 5\' 5"  (1.651 m), weight 93.9 kg, SpO2 96 %.  1. Functional deficits secondary to anoxic brain injury after cardiac arrest             -patient may shower             -ELOS/Goals: 13-17 days -First day of evaluations- Con't CIR- PT, OT and SLP- pt wants grounds pass- will not  allow til therapy oK's- also advised pt to not get OOB without staff 2.  Antithrombotics: -DVT/anticoagulation:  Pharmaceutical: Lovenox             -antiplatelet therapy: N/A 3. Pain Management: Oxycodone 5mg  prn.  As outpatient she follows with [facility] on oxycodone. --Will add lidocaine patches to chest wall 4. Mood/Behavior/Sleep: LCSW to follow for evaluation and support.  Continue Zoloft             --slept better last 2 nights after ambien d/c.  Monitor for now 8/24- will change trazodone to 75 mg QHS and give at 8pm             -antipsychotic agents: N/A 5. Neuropsych/cognition: This patient is capable of making decisions on his own behalf. 6. Skin/Wound Care: Routine pressure relief  measures.  7. Fluids/Electrolytes/Nutrition: Monitor I/O. Check CMET in am.  8. VT arrest/QT prolongation: Continue Metoprolol BID.  Monitor K+ and Mg. 9. Hypomagnesemia: Recheck labs in am. MG 1.4 8/23 --Will add daily supplement.  10. Seizure d/o (GTC/starring spells/Dr. 9/24): Stable on Lacosamide 100mg  bid.  11. Depression:  Mood stable on increased dose Zoloft.  12. Acute blood loss anemia: H/H has varied from 8-10 range. Last HGB 10 8/21. Recheck in am             --monitor for signs of bleeding. 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              --continue Singulair and Budesonide BID.  14. 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 Right septic bursitis: Has completed treatment.   17. Hives            -Start prednisone dose pack and allegra  18. Transaminitis  8/24- AST 53- will monitor for now- with pancreatic insuff.  19. Hypomagnesemia  8/24- will increase  PO Mg to 800 mg daily- and will recheck in AM- might need 800 mg BID- also recheck BMP since K+ borderline low-    I spent a total of 39   minutes  on total care today- >50% coordination of care- due to prolonged d/w pt, nursing about pt being up; and grounds pass- and pharmacy about changing all night time meds to 8pm   LOS: 1 days A FACE TO FACE EVALUATION WAS PERFORMED  Claud Gowan 12/10/2021, 10:24 AM

## 2021-12-10 NOTE — Progress Notes (Signed)
Inpatient Rehabilitation  Patient information reviewed and entered into eRehab system by Renatta Shrieves Iyari Hagner, OTR/L, Rehab Quality Coordinator.   Information including medical coding, functional ability and quality indicators will be reviewed and updated through discharge.   

## 2021-12-10 NOTE — Plan of Care (Signed)
Problem: RH Balance Goal: LTG Patient will maintain dynamic standing with ADLs (OT) Description: LTG:  Patient will maintain dynamic standing balance with assist during activities of daily living (OT)  Flowsheets (Taken 12/10/2021 2244) LTG: Pt will maintain dynamic standing balance during ADLs with: Independent   Problem: Sit to Stand Goal: LTG:  Patient will perform sit to stand in prep for activites of daily living with assistance level (OT) Description: LTG:  Patient will perform sit to stand in prep for activites of daily living with assistance level (OT) Flowsheets (Taken 12/10/2021 2244) LTG: PT will perform sit to stand in prep for activites of daily living with assistance level: Independent   Problem: RH Grooming Goal: LTG Patient will perform grooming w/assist,cues/equip (OT) Description: LTG: Patient will perform grooming with assist, with/without cues using equipment (OT) Flowsheets (Taken 12/10/2021 2244) LTG: Pt will perform grooming with assistance level of: Independent   Problem: RH Bathing Goal: LTG Patient will bathe all body parts with assist levels (OT) Description: LTG: Patient will bathe all body parts with assist levels (OT) Flowsheets (Taken 12/10/2021 2244) LTG: Pt will perform bathing with assistance level/cueing: Independent with assistive device    Problem: RH Dressing Goal: LTG Patient will perform upper body dressing (OT) Description: LTG Patient will perform upper body dressing with assist, with/without cues (OT). Flowsheets (Taken 12/10/2021 2244) LTG: Pt will perform upper body dressing with assistance level of: Independent Goal: LTG Patient will perform lower body dressing w/assist (OT) Description: LTG: Patient will perform lower body dressing with assist, with/without cues in positioning using equipment (OT) Flowsheets (Taken 12/10/2021 2244) LTG: Pt will perform lower body dressing with assistance level of: Independent with assistive device    Problem: RH Toileting Goal: LTG Patient will perform toileting task (3/3 steps) with assistance level (OT) Description: LTG: Patient will perform toileting task (3/3 steps) with assistance level (OT)  Flowsheets (Taken 12/10/2021 2244) LTG: Pt will perform toileting task (3/3 steps) with assistance level: Independent with assistive device   Problem: RH Functional Use of Upper Extremity Goal: LTG Patient will use RT/LT upper extremity as a (OT) Description: LTG: Patient will use right/left upper extremity as a stabilizer/gross assist/diminished/nondominant/dominant level with assist, with/without cues during functional activity (OT) Flowsheets (Taken 12/10/2021 2244) LTG: Use of upper extremity in functional activities:  RUE as dominant level  LUE as nondominant level LTG: Pt will use upper extremity in functional activity with assistance level of: Independent   Problem: RH Simple Meal Prep Goal: LTG Patient will perform simple meal prep w/assist (OT) Description: LTG: Patient will perform simple meal prep with assistance, with/without cues (OT). Flowsheets (Taken 12/10/2021 2244) LTG: Pt will perform simple meal prep with assistance level of: Supervision/Verbal cueing   Problem: RH Laundry Goal: LTG Patient will perform laundry w/assist, cues (OT) Description: LTG: Patient will perform laundry with assistance, with/without cues (OT). Flowsheets (Taken 12/10/2021 2244) LTG: Pt will perform laundry with assistance level of: Supervision/Verbal cueing   Problem: RH Light Housekeeping Goal: LTG Patient will perform light housekeeping w/assist (OT) Description: LTG: Patient will perform light housekeeping with assistance, with/without cues (OT). Flowsheets (Taken 12/10/2021 2244) LTG: Pt will perform light housekeeping with assistance level of: Supervision/Verbal cueing   Problem: RH Toilet Transfers Goal: LTG Patient will perform toilet transfers w/assist (OT) Description: LTG: Patient  will perform toilet transfers with assist, with/without cues using equipment (OT) Flowsheets (Taken 12/10/2021 2244) LTG: Pt will perform toilet transfers with assistance level of: Supervision/Verbal cueing   Problem: RH Tub/Shower Transfers  Goal: LTG Patient will perform tub/shower transfers w/assist (OT) Description: LTG: Patient will perform tub/shower transfers with assist, with/without cues using equipment (OT) Flowsheets (Taken 12/10/2021 2244) LTG: Pt will perform tub/shower stall transfers with assistance level of: Supervision/Verbal cueing

## 2021-12-10 NOTE — Plan of Care (Signed)
  Problem: RH Problem Solving Goal: LTG Patient will demonstrate problem solving for (SLP) Description: LTG:  Patient will demonstrate problem solving for basic/complex daily situations with cues  (SLP) Flowsheets (Taken 12/10/2021 1637) LTG: Patient will demonstrate problem solving for (SLP): Complex daily situations LTG Patient will demonstrate problem solving for: Supervision   Problem: RH Memory Goal: LTG Patient will use memory compensatory aids to (SLP) Description: LTG:  Patient will use memory compensatory aids to recall biographical/new, daily complex information with cues (SLP) Flowsheets (Taken 12/10/2021 1637) LTG: Patient will use memory compensatory aids to (SLP): Modified Independent

## 2021-12-11 DIAGNOSIS — G931 Anoxic brain damage, not elsewhere classified: Secondary | ICD-10-CM | POA: Diagnosis not present

## 2021-12-11 LAB — BASIC METABOLIC PANEL
Anion gap: 6 (ref 5–15)
BUN: 7 mg/dL (ref 6–20)
CO2: 26 mmol/L (ref 22–32)
Calcium: 8.9 mg/dL (ref 8.9–10.3)
Chloride: 100 mmol/L (ref 98–111)
Creatinine, Ser: 0.66 mg/dL (ref 0.44–1.00)
GFR, Estimated: >60 mL/min (ref >60)
Glucose, Bld: 425 mg/dL — ABNORMAL HIGH (ref 70–99)
Potassium: 4.5 mmol/L (ref 3.5–5.1)
Sodium: 132 mmol/L — ABNORMAL LOW (ref 135–145)

## 2021-12-11 LAB — MAGNESIUM: Magnesium: 1.5 mg/dL — ABNORMAL LOW (ref 1.7–2.4)

## 2021-12-11 MED ORDER — MAGNESIUM SULFATE 2 GM/50ML IV SOLN
2.0000 g | Freq: Once | INTRAVENOUS | Status: AC
  Administered 2021-12-11: 2 g via INTRAVENOUS
  Filled 2021-12-11: qty 50

## 2021-12-11 MED ORDER — SODIUM CHLORIDE 0.9 % IV SOLN: INTRAVENOUS | Status: DC | PRN

## 2021-12-11 MED ORDER — MAGNESIUM OXIDE -MG SUPPLEMENT 400 (240 MG) MG PO TABS
800.0000 mg | ORAL_TABLET | Freq: Two times a day (BID) | ORAL | Status: DC
  Administered 2021-12-11 – 2021-12-15 (×8): 800 mg via ORAL
  Filled 2021-12-11 (×8): qty 2

## 2021-12-11 MED ORDER — MAGNESIUM OXIDE -MG SUPPLEMENT 400 (240 MG) MG PO TABS: 800.0000 mg | ORAL_TABLET | Freq: Two times a day (BID) | ORAL | Status: DC

## 2021-12-11 NOTE — Progress Notes (Signed)
Speech Language Pathology Daily Session Note  Patient Details  Name: Kara [name] MRN: [id number] Date of Birth: [birth year]  Today's Date: 12/11/2021 SLP Individual Time: 1003-1105 SLP Individual Time Calculation (min): 62 min  Short Term Goals: Week 1: SLP Short Term Goal 1 (Week 1): STG=LTG due to ELOS  Skilled Therapeutic Interventions: Skilled ST treatment focused on cognitive goals. Pt was received upright in chair on arrival and accompanied by her spouse and mother. Pt ambulated to speech therapy room with CGA and was able to dual task by engaging in conversation without difficulty. SLP facilitated functional problem solving and math calculations with ALFA "solving daily math problems" subtest with 9/10 and able to self correct error x1 with sup A verbal cue. SLP facilitated complex problem solving through scheduling and deductive reasoning tasks with sup A verbal cues fading to mod I. Pt completed all tasks in a timely manner. Pt engaged in discussion with appropriate anticipation of needs at discharge. Pt ambulated back to room with CGA. SLP provided education to pt/spouse on cognitively engaging tasks at discharge. Pt/mom expressed interest in counseling services at discharge to support coping and processing recent events. Plan to provide a list of resources per request. Patient was left in chair with alarm activated and immediate needs within reach at end of session. Continue per current plan of care.      Pain   None/denied  Therapy/Group: Individual Therapy  [name] 12/11/2021, 12:15 PM

## 2021-12-11 NOTE — Progress Notes (Signed)
Patient ID: Kara [name], female   DOB: May 02, 1985, 36 y.o.   MRN: [id number]  Team feels pt will reach her goals by Tuesday 8/29 and pt and family very pleased with this. Will work toward discharge Tuesday

## 2021-12-11 NOTE — Progress Notes (Signed)
Physical Therapy Session Note  Patient Details  Name: Kara [name] MRN: [id number] Date of Birth: [birth year]  Today's Date: 12/11/2021 PT Individual Time: 1314-3888 PT Individual Time Calculation (min): 40 min   Short Term Goals: Week 1:  PT Short Term Goal 1 (Week 1): STG=LTG due to ELOS   Skilled Therapeutic Interventions/Progress Updates:   Pt received supine in bed and agreeable to PT. Supine>sit transfer without assist or cues from PT. Sit<>stand transfers to and from bed with supervision assist. Gait training with no AD through rehab unit with supervision assist 2 x 225 to day room. One near LOB but able to self correct without assist . Dynamic balance training on Wii fit balance board performed table tilt x 2, penguin slide x 2 and bubble run x 2 with CGA/min assist from PT for ankle strategy and improved COM control. Pt returned to room and performed ambulatory transfer to bed with no AD and supervision assist. Sit>supine completed with without assist, and left supine in bed with call bell in reach and all needs met.        Therapy Documentation Precautions:  Precautions Precautions: None Precaution Comments: Watch HR and O2 sats, Cortrak, trach, flexiseal Restrictions Weight Bearing Restrictions: No    Pain: denies    Therapy/Group: Individual Therapy  Lorie Phenix 12/11/2021, 1:24 PM

## 2021-12-11 NOTE — Progress Notes (Signed)
Physical Therapy Session Note  Patient Details  Name: Kara [name] MRN: [id number] Date of Birth: 30-Nov-1985  Today's Date: 12/11/2021 PT Individual Time: 0805-0833 PT Individual Time Calculation (min): 28 min   Short Term Goals: Week 1:  PT Short Term Goal 1 (Week 1): STG=LTG due to ELOS  Skilled Therapeutic Interventions/Progress Updates: Pt presented in bed agreeable to therapy. Pt denies pain during session. Performed bed mobility with supervision and donned socks/shoes with set up. Pt ambulated to rehab gym with supervision and without AD and participated in lego block task (level 3) then performed another level 3 while standing on Airex. Pt also participated in use of rebounder while standing on Airex and naming states for duel task activity. Pt able to recall ~20 states however requiring slightly more increased time towards end of recall task. Pt then ambulated to day room and participated in lift size connect four while engaging in meaningful conversation. Once game completed pt assisting in picking up pieces and cleaning pieces with set up assist. Pt ambulated back to room and returned to EOB at end of session. Pt left with bed alarm on, call bell within reach and needs met.      Therapy Documentation Precautions:  Precautions Precautions: None Precaution Comments: Watch HR and O2 sats, Cortrak, trach, flexiseal Restrictions Weight Bearing Restrictions: No General:   Vital Signs:   Pain:   Mobility:   Locomotion :    Trunk/Postural Assessment :    Balance:   Exercises:   Other Treatments:      Therapy/Group: Individual Therapy  Vincient Vanaman 12/11/2021, 12:40 PM

## 2021-12-11 NOTE — IPOC Note (Signed)
Overall Plan of Care [facility]) Patient Details Name: Kara [name] MRN: [id number] DOB: 11-04-1985  Admitting Diagnosis: Anoxic brain injury [facility])  Hospital Problems: Principal Problem:   Anoxic brain injury (HCC)     Functional Problem List: Nursing Nutrition, Pain, Safety, Endurance, Sensory, Medication Management, Skin Integrity, Bladder, Bowel  PT Sensory, Motor, Balance  OT Balance, Cognition, Endurance, Safety, Motor, Perception  SLP Cognition  TR         Basic ADL's: OT Grooming, Bathing, Dressing, Toileting     Advanced  ADL's: OT Simple Meal Preparation, Light Housekeeping, Laundry     Transfers: PT Bed Mobility, Bed to Chair, Customer service manager, Tub/Shower     Locomotion: PT Ambulation     Additional Impairments: OT None  SLP Social Cognition   Problem Solving, Memory  TR      Anticipated Outcomes Item Anticipated Outcome  Self Feeding indep  Swallowing      Basic self-care  mod I  Toileting  mod I   Bathroom Transfers mod I  Bowel/Bladder  to be continent  Hydrographic surveyor     Cognition  sup A  Pain  less than 3  Safety/Judgment  to remain fall free while in rehab   Therapy Plan: PT Intensity: Minimum of 1-2 x/day ,45 to 90 minutes PT Frequency: 5 out of 7 days PT Duration Estimated Length of Stay: 3-5 days OT Intensity: Minimum of 1-2 x/day, 45 to 90 minutes OT Frequency: 5 out of 7 days OT Duration/Estimated Length of Stay: 5-7 days SLP Intensity: Minumum of 1-2 x/day, 30 to 90 minutes SLP Frequency: 3 to 5 out of 7 days SLP Duration/Estimated Length of Stay: 7-10 days   Team Interventions: Nursing Interventions Patient/Family Education, Disease Management/Prevention, Skin Care/Wound Management, Discharge Planning, Bladder Management, Pain Management, Bowel Management, Medication Management  PT interventions Ambulation/gait training, Discharge planning, Functional mobility training,  Psychosocial support, Pain management, Therapeutic Activities, UE/LE Strength taining/ROM, Warden/ranger, Community reintegration, Disease management/prevention, Neuromuscular re-education, Equities trader education, Museum/gallery curator, Therapeutic Exercise, UE/LE Coordination activities  OT Interventions Warden/ranger, Cognitive remediation/compensation, Firefighter, Discharge planning, Disease mangement/prevention, Fish farm manager, Functional mobility training, Neuromuscular re-education, Patient/family education, Psychosocial support, Self Care/advanced ADL retraining, Therapeutic Activities, Therapeutic Exercise, UE/LE Strength taining/ROM, UE/LE Coordination activities, Visual/perceptual remediation/compensation  SLP Interventions Cognitive remediation/compensation, Patient/family education, Therapeutic Activities, Functional tasks, Internal/external aids  TR Interventions    SW/CM Interventions Discharge Planning, Psychosocial Support, Patient/Family Education   Barriers to Discharge MD  Medical stability, Home enviroment access/loayout, Lack of/limited family support, and Weight  Nursing Decreased caregiver support, Home environment access/layout, Trach, Wound Care, Weight    PT Inaccessible home environment house, spouse, 2 level with 2 steps to enter with no rails and 10 steps with right rail to access 2nd level. able to live on main level  OT      SLP      SW Insurance for SNF coverage     Team Discharge Planning: Destination: PT-Home ,OT- Home , SLP-Home Projected Follow-up: PT-Outpatient PT, OT-  Outpatient OT, 24 hour supervision/assistance, SLP-Outpatient SLP Projected Equipment Needs: PT-None recommended by PT, OT- None recommended by OT, SLP-None recommended by SLP Equipment Details: PT-None, OT-family to obtain reacher and shower stool Patient/family involved in discharge planning: PT- Patient, Family member/caregiver,   OT-Family Adult nurse, SLP-Patient, Family member/caregiver  MD ELOS: 5-7 days Medical Rehab Prognosis:  Excellent Assessment: The patient has been admitted for CIR therapies with the diagnosis of anoxic brain  injury. The team will be addressing functional mobility, strength, stamina, balance, safety, adaptive techniques and equipment, self-care, bowel and bladder mgt, patient and caregiver education, cognition/memory. Goals have been set at mod I to supervision. Anticipated discharge destination is home.        See Team Conference Notes for weekly updates to the plan of care

## 2021-12-11 NOTE — Progress Notes (Signed)
Occupational Therapy Session Note  Patient Details  Name: Kara [name] MRN: [id number] Date of Birth: 04/19/1986  Today's Date: 12/11/2021 OT Individual Time: 0900-1000 OT Individual Time Calculation (min): 60 min    Short Term Goals: Week 1:  OT Short Term Goal 1 (Week 1): Pt will be indep with all self care incl showers with DME/AE OT Short Term Goal 2 (Week 1): Pt wil be indep to set up ADL routine wiht LRAD OT Short Term Goal 3 (Week 1): Pt will be indep for functioanl cognitive tasks such as map reading, grocery list planning OT Short Term Goal 4 (Week 1): Pt will be indep with simple meal prep, light laundry and item retrieval in apartment space OT Short Term Goal 5 (Week 1): Pt will require S for in and out of tub as pt prefers tub bath  Skilled Therapeutic Interventions/Progress Updates:  Pt seen for full AM self care re-training this am including shower. Husband and mom present to discuss goals for the weekend for higher level IADL's and discharge for Tuesday with agreement. Pt moved bed to EOB with S, amb to bathroom without AD close S. Full hair washing, UB and LB bathing shower level with S. Sink side seated and standing level UB/LB dressing and grooming with indep. Min cues for continued awareness for pacing and energy conservation. Tennis shoes with increased time and effort but I also. Left pt recliner level with chair alarm active, call button and needs within reach.   Therapy Documentation Precautions:  Precautions Precautions: None Precaution Comments: Watch HR and O2 sats, Cortrak, trach, flexiseal Restrictions Weight Bearing Restrictions: No    Therapy/Group: Individual Therapy  Vicenta Dunning 12/11/2021, 12:46 PM

## 2021-12-11 NOTE — Progress Notes (Signed)
PROGRESS NOTE   Subjective/Complaints:  Mg level low at 1.5 Pt has no issues Slept MUCH better with trazodone increase and changing meds to 8pm last night.   ROS:  Pt denies SOB, abd pain, CP, N/V/C/D, and vision changes    Objective:   No results found. Recent Labs    12/10/21 0502  WBC 7.7  HGB 9.8*  HCT 29.3*  PLT 171   Recent Labs    12/10/21 0502 12/11/21 0625  NA 138 132*  K 3.6 4.5  CL 103 100  CO2 24 26  GLUCOSE 152* 425*  BUN 9 7  CREATININE 0.68 0.66  CALCIUM 9.2 8.9    Intake/Output Summary (Last 24 hours) at 12/11/2021 5277 Last data filed at 12/11/2021 0813 Gross per 24 hour  Intake 1330 ml  Output --  Net 1330 ml        Physical Exam: Vital Signs Blood pressure 104/63, pulse 64, temperature 97.9 F (36.6 C), temperature source Oral, resp. rate 18, height 5\' 5"  (1.651 m), weight 93.9 kg, SpO2 94 %.    General: supine in bed; woke form sleep;  NAD HENT: conjugate gaze; oropharynx moist CV: regular rate; no JVD Pulmonary: CTA B/L; no W/R/R- good air movement GI: soft, NT, ND, (+)BS Psychiatric: appropriate- very sleepy- woke from sleep Neurological: sleepy- speech fluent Skin: warm and dry, healing I&D R infrapatella incision Neuro:  Alert and oriented x4, follows 2 step commands,makes eye contact with examiners,  can name president, not able to state her age correctly, unable to count down by 7s after 93, short and long term memory mildly abnormal, speech normal, sensation intact to LT in all 4 extremities  FTN slightly decreased on Left Strength 5/5 in b/l UE and b/l LE Musculoskeletal:  No abnormal tone or ROM noted Claw toes b/l LE digits 2-4  Assessment/Plan: 1. Functional deficits which require 3+ hours per day of interdisciplinary therapy in a comprehensive inpatient rehab setting. Physiatrist is providing close team supervision and 24 hour management of active medical  problems listed below. Physiatrist and rehab team continue to assess barriers to discharge/monitor patient progress toward functional and medical goals  Care Tool:  Bathing    Body parts bathed by patient: Right arm, Chest, Left arm, Abdomen, Front perineal area, Buttocks, Right upper leg, Left upper leg, Right lower leg, Left lower leg, Face         Bathing assist Assist Level: Minimal Assistance - Patient > 75%     Upper Body Dressing/Undressing Upper body dressing   What is the patient wearing?: Pull over shirt    Upper body assist Assist Level: Contact Guard/Touching assist    Lower Body Dressing/Undressing Lower body dressing      What is the patient wearing?: Pants     Lower body assist Assist for lower body dressing: Minimal Assistance - Patient > 75%     Toileting Toileting Toileting Activity did not occur (Clothing management and hygiene only): N/A (no void or bm)  Toileting assist Assist for toileting: Minimal Assistance - Patient > 75%     Transfers Chair/bed transfer  Transfers assist     Chair/bed transfer assist level: Supervision/Verbal  cueing     Locomotion Ambulation   Ambulation assist      Assist level: Contact Guard/Touching assist Assistive device: No Device Max distance: >300 feet   Walk 10 feet activity   Assist     Assist level: Contact Guard/Touching assist Assistive device: No Device   Walk 50 feet activity   Assist    Assist level: Contact Guard/Touching assist Assistive device: No Device    Walk 150 feet activity   Assist    Assist level: Contact Guard/Touching assist Assistive device: No Device    Walk 10 feet on uneven surface  activity   Assist     Assist level: Contact Guard/Touching assist Assistive device: Other (comment) (None)   Wheelchair     Assist Is the patient using a wheelchair?: Yes Type of Wheelchair: Manual    Wheelchair assist level: Supervision/Verbal cueing Max  wheelchair distance: 200 feet    Wheelchair 50 feet with 2 turns activity    Assist        Assist Level: Supervision/Verbal cueing   Wheelchair 150 feet activity     Assist      Assist Level: Supervision/Verbal cueing   Blood pressure 104/63, pulse 64, temperature 97.9 F (36.6 C), temperature source Oral, resp. rate 18, height 5\' 5"  (1.651 m), weight 93.9 kg, SpO2 94 %.  1. Functional deficits secondary to anoxic brain injury after cardiac arrest             -patient may shower             -ELOS/Goals: 13-17 days -Set d/c for next Tuesday with therapy  Con't CIR- PT and OT and SLP- IPOC today 2.  Antithrombotics: -DVT/anticoagulation:  Pharmaceutical: Lovenox             -antiplatelet therapy: N/A 3. Pain Management: Oxycodone 5mg  prn.  As outpatient she follows with [facility] on oxycodone. --Will add lidocaine patches to chest wall 4. Mood/Behavior/Sleep: LCSW to follow for evaluation and support.  Continue Zoloft             --slept better last 2 nights after ambien d/c.  Monitor for now 8/24- will change trazodone to 75 mg QHS and give at 8pm  8/25- went much better- con't regimen             -antipsychotic agents: N/A 5. Neuropsych/cognition: This patient is capable of making decisions on his own behalf. 6. Skin/Wound Care: Routine pressure relief  measures.  7. Fluids/Electrolytes/Nutrition: Monitor I/O. Check CMET in am.  8. VT arrest/QT prolongation: Continue Metoprolol BID.  Monitor K+ and Mg. 9. Hypomagnesemia: Recheck labs in am. MG 1.4 8/23 --Will add daily supplement.  10. Seizure d/o (GTC/starring spells/Dr. 9/25): Stable on Lacosamide 100mg  bid.  11. Depression:  Mood stable on increased dose Zoloft.  12. Acute blood loss anemia: H/H has varied from 8-10 range. Last HGB 10 8/21. Recheck in am             --monitor for signs of bleeding.  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               --continue Singulair and Budesonide BID.  14. 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 Right septic bursitis: Has completed treatment.  17. Hives            -Start prednisone dose pack and allegra  18. Transaminitis  8/24- AST 53- will monitor for now- with pancreatic insuff.  19. Hypomagnesemia  8/24- will increase PO Mg to 800 mg daily- and will recheck in AM- might need 800 mg BID- also recheck BMP since K+ borderline low-   8/25- Mg down to 1.5- will increase Mg to 800 mg BID and recheck Monday- K+ 4.5- will also give IV Mg 1 more time for now after therapy 2 grams.    I spent a total of 35   minutes on total care today- >50% coordination of care- due to  ipoc and d/w with nursing about sleep   LOS: 2 days A FACE TO FACE EVALUATION WAS PERFORMED  Samarra Ridgely 12/11/2021, 8:21 AM

## 2021-12-12 LAB — GLUCOSE, CAPILLARY: Glucose-Capillary: 248 mg/dL — ABNORMAL HIGH (ref 70–99)

## 2021-12-12 NOTE — Progress Notes (Signed)
Speech Language Pathology Daily Session Note  Patient Details  Name: Kara [name] MRN: [id number] Date of Birth: November 25, 1985  Today's Date: 12/12/2021 SLP Individual Time: 1400-1500 SLP Individual Time Calculation (min): 60 min  Short Term Goals: Week 1: SLP Short Term Goal 1 (Week 1): STG=LTG due to ELOS  Skilled Therapeutic Interventions: Skilled ST treatment focused on cognitive goals. Pt was received upright in bed and agreeable to ambulate to speech therapy room with CGA. SLP and pt completing medication management task to increase awareness of current medication regime. Pt wrote down information including name of medication, dose, frequency, and purpose on medication chart provided by SLP. Pt was able to locate pertinent information on med chart with independence, and recalled name/purpose of medications with 100% accuracy with mod I for use of chart as external aid. Pt then loaded Pt then loaded medications into a twice daily pill box with mod I and 100% accuracy. Pt demonstrated appropriate problem solving skills to hypothetical problem solving scenarios and appropriate anticipatory awareness. Pt demonstrating excellent progress toward goals. SLP provided pt with cognitive activities to work on in room this weekend, as well as list of online therapy/counseling services providers per pt's request. Also provided with an article containing information on considerations for selecting an online therapy provider. Patient was left in bed with alarm activated and immediate needs within reach at end of session. Continue per current plan of care.      Pain Pain Assessment Pain Scale: 0-10 Pain Score: 0-No pain  Therapy/Group: Individual Therapy  [name] 12/12/2021, 5:39 PM

## 2021-12-12 NOTE — Progress Notes (Signed)
Physical Therapy Session Note  Patient Details  Name: Kara [name] MRN: [id number] Date of Birth: 03/29/1986  Today's Date: 12/12/2021 PT Individual Time: 1100-1155 PT Individual Time Calculation (min): 55 min   Short Term Goals: Week 1:  PT Short Term Goal 1 (Week 1): STG=LTG due to ELOS   Skilled Therapeutic Interventions/Progress Updates:   Pt received sitting in EOB and agreeable to PT.   Gait training through hall with supervision assist from PT for safety 2 x 250ft and 150ft x2. Pt instructed in dynamic balance training while engaged in fgross movement of Wii bowling performed 1 frame sitting, 3 frames on level surface. 2 frames with partial Bil feet on red wedge and 4 frames with BLE entirely on red wedge. Mod assist initially to prevent posterior LOB from PT progressing to supervision assist standign on wedge. Stair management training performed forward x 8 BLE and x 8 BLE laterally with supervision assist and BUE support on rail. Nustep BLE endurance training x 9 minutes with cues for proper speed to prevent excessive fatigue. Performed level 5-7. Pt returned to room and performed ambulatory  transfer to bed with supervision assist. Sit>supine completed with without assist, and left supine in bed with call bell in reach and all needs met.          Therapy Documentation Precautions:  Precautions Precautions: None Precaution Comments: Watch HR and O2 sats, Cortrak, trach, flexiseal Restrictions Weight Bearing Restrictions: No  Vital Signs: Therapy Vitals Temp: 98.6 F (37 C) Pulse Rate: 79 Resp: 17 BP: 116/77 Patient Position (if appropriate): Sitting Oxygen Therapy SpO2: 100 % O2 Device: Room Air Pain:  denies    Therapy/Group: Individual Therapy   E  12/12/2021, 3:32 PM  

## 2021-12-12 NOTE — Progress Notes (Signed)
Speech Language Pathology Discharge Summary  Patient Details  Name: Daley Mooradian MRN: [id number] Date of Birth: 1986-02-01  Date of Discharge from SLP service:December 14, 2021  {chl ip rehab slp time calculations:304100500}  Skilled Therapeutic Interventions:  ***   Patient has met 2 of 2 long term goals.  Patient to discharge at overall Modified Independent level.  Reasons goals not met: All goals met   Clinical Impression/Discharge Summary: Patient has made excellent gains and has met 2 of 2 long-term goals this admission. Patient is currently completing functional and complex cognitive tasks with mod I in regards to complex problem solving, executive functions, and memory goals. Patient and family education is complete and patient to discharge at overall mod I level. Patient's care partner is independent to provide the necessary physical and cognitive assistance at discharge. Patient would benefit from continued SLP services in outpatient setting to maximize cognitive function and functional independence.   Care Partner:  Caregiver Able to Provide Assistance: Yes  Type of Caregiver Assistance: Cognitive;Physical  Recommendation:  Outpatient SLP  Rationale for SLP Follow Up: Maximize cognitive function and independence   Equipment: None   Reasons for discharge: Discharged from hospital;Treatment goals met   Patient/Family Agrees with Progress Made and Goals Achieved: Yes    Patty Sermons 12/12/2021, 5:47 PM

## 2021-12-12 NOTE — Progress Notes (Signed)
Occupational Therapy Session Note  Patient Details  Name: Kara [name] MRN: [id number] Date of Birth: [birth year]  Today's Date: 12/12/2021 OT Individual Time: 5465-6812 OT Individual Time Calculation (min): 79 min    Short Term Goals: Week 1:  OT Short Term Goal 1 (Week 1): Pt will be indep with all self care incl showers with DME/AE OT Short Term Goal 2 (Week 1): Pt wil be indep to set up ADL routine wiht LRAD OT Short Term Goal 3 (Week 1): Pt will be indep for functioanl cognitive tasks such as map reading, grocery list planning OT Short Term Goal 4 (Week 1): Pt will be indep with simple meal prep, light laundry and item retrieval in apartment space OT Short Term Goal 5 (Week 1): Pt will require S for in and out of tub as pt prefers tub bath  Skilled Therapeutic Interventions/Progress Updates:  Pt awake seated at EOB with family present in room upon OT arrival to the room. Pt reports, "My stomach feels a little off. We went downstairs for breakfast." Pt in agreement for OT session. Pt's spouse plans to bring up pt's flute that pt plays as a leisure activity/hobby to trial with OT to see if pt has difficulties to improve independence with leisure tasks.   Therapy Documentation Precautions:  Precautions Precautions: None Precaution Comments: Watch HR and O2 sats, Cortrak, trach, flexiseal Restrictions Weight Bearing Restrictions: No Vital Signs: Please see "Flowsheet" for most recent vitals charted by nursing staff.  Pain: Pain Assessment Pain Scale: 0-10 Pain Score: 0-No pain  ADL: Pt declines need to perform ADL's at this time.   Functional Mobility: Pt participates in functional ambulation in order to improve endurance and independence with mobility. Pt able to ambulate from room > speech office (>150') to perform IADL task > ADL apartment (~751') > room (~50') with no AD and CGA - close supervision for safety. Pt requires increased time due to slowed cadence. Encouraged pt to  utilize music with faster beat in airpods when performing ambulation or educated pt on how the use of a metronome can assist with pacing. Pt verbalizes understanding.   IADL's (Typing for Work): Pt participates in typing task to improve fine motor coordination and visual perceptual skills needed for eventual return to work. Pt reported that typing is one of pt's main concerns therefore OT chose to address this concern and problem solve with pt. Pt able to participate in typing program on pt's personal laptop to simulate working tasks. Pt able to complete typing program for 3 trails with the following results: -Trial 1 (1 minute): 31 wpm x 96% accuracy = 30 wpm  -Trail 2 (2 minutes): 39 wpm x 98% accuracy = 38 wpm -Trial 3 (5 minutes): 47 wpm x 97% accuracy = 47 wpm  Encouraged pt to utilize this program throughout the day and improve times that pt types as pt is able to do so without s/s of visual fatigue, headache, etc. Pt verbalizes understanding and bookmarks this website to practice on personal laptop. 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 OT educated pt on the various ways that pt can downgrade and grade typing activities up to assist with pt's tolerance and skills needed for return to work. OT provides education on various tools such as blue light glasses, blue light filter on  computer, or putting reminders on the computer to assist with pt's tolerance to visual input or perform visual skills needed for typing tasks. Educated pt and spouse on how with a brain injury, pt can experience photosensitivity and increased straining/fatigue with blue light input. Educated on the importance of limiting blue light ~1 hour before bedtime to assist with proper sleep hygiene needed for healing of the brain. Encouraged pt to utilize  sunglasses if needed outside due to potential photosensitivity. Pt verbalizes understanding.   IADLs (Kitchen Management): Pt performs object retrieval in the kitchen to retrieve items from low cabinet and place in a higher cabinet to improve independence with IADLs within the home environment. Pt able to retrieve 3 plates from a low cabinet and place in a higher cabinet with close supervision for safety with standing balance. OT provided education on safety concerns with performing higher level cooking such as using the stove, emptying pots of hot water, or cutting foods. Encouraged pt to have spouse complete these tasks until pt's balance and fine motor coordination improves. Pt and spouse verbalize understanding.   Pt returned to EOB of session. Pt left resting comfortably at EOB with personal belongings and call light within reach, bed in low position, 3 bed rails up, family present in room, and comfort needs attended to.   Therapy/Group: Individual Therapy  Lavera Guise 12/12/2021, 5:16 PM

## 2021-12-13 DIAGNOSIS — K861 Other chronic pancreatitis: Secondary | ICD-10-CM | POA: Diagnosis not present

## 2021-12-13 DIAGNOSIS — R9431 Abnormal electrocardiogram [ECG] [EKG]: Secondary | ICD-10-CM | POA: Diagnosis not present

## 2021-12-13 DIAGNOSIS — G931 Anoxic brain damage, not elsewhere classified: Secondary | ICD-10-CM | POA: Diagnosis not present

## 2021-12-13 DIAGNOSIS — E876 Hypokalemia: Secondary | ICD-10-CM

## 2021-12-13 LAB — GLUCOSE, CAPILLARY
Glucose-Capillary: 435 mg/dL — ABNORMAL HIGH (ref 70–99)
Glucose-Capillary: 343 mg/dL — ABNORMAL HIGH (ref 70–99)
Glucose-Capillary: 137 mg/dL — ABNORMAL HIGH (ref 70–99)

## 2021-12-13 LAB — BASIC METABOLIC PANEL
Anion gap: 11 (ref 5–15)
BUN: 12 mg/dL (ref 6–20)
CO2: 26 mmol/L (ref 22–32)
Calcium: 9.6 mg/dL (ref 8.9–10.3)
Chloride: 91 mmol/L — ABNORMAL LOW (ref 98–111)
Creatinine, Ser: 0.94 mg/dL (ref 0.44–1.00)
GFR, Estimated: >60 mL/min (ref >60)
Glucose, Bld: 512 mg/dL (ref 70–99)
Potassium: 4.5 mmol/L (ref 3.5–5.1)
Sodium: 128 mmol/L — ABNORMAL LOW (ref 135–145)

## 2021-12-13 LAB — MAGNESIUM: Magnesium: 1.4 mg/dL — ABNORMAL LOW (ref 1.7–2.4)

## 2021-12-13 MED ORDER — INSULIN ASPART 100 UNIT/ML IJ SOLN
0.0000 [IU] | Freq: Three times a day (TID) | INTRAMUSCULAR | Status: DC
  Administered 2021-12-13: 11 [IU] via SUBCUTANEOUS
  Administered 2021-12-14 (×2): 3 [IU] via SUBCUTANEOUS
  Administered 2021-12-14: 15 [IU] via SUBCUTANEOUS
  Administered 2021-12-15: 5 [IU] via SUBCUTANEOUS

## 2021-12-13 MED ORDER — INSULIN ASPART 100 UNIT/ML IJ SOLN
0.0000 [IU] | Freq: Every day | INTRAMUSCULAR | Status: DC
  Administered 2021-12-14: 2 [IU] via SUBCUTANEOUS

## 2021-12-13 MED ORDER — SODIUM CHLORIDE 0.9 % IV SOLN: INTRAVENOUS | Status: DC

## 2021-12-13 MED ORDER — INSULIN ASPART 100 UNIT/ML IJ SOLN
10.0000 [IU] | Freq: Once | INTRAMUSCULAR | Status: AC
  Administered 2021-12-13: 10 [IU] via SUBCUTANEOUS

## 2021-12-13 MED ORDER — MAGNESIUM SULFATE 4 GM/100ML IV SOLN
4.0000 g | Freq: Once | INTRAVENOUS | Status: AC
Start: 2021-12-13 — End: 2021-12-13
  Administered 2021-12-13: 4 g via INTRAVENOUS
  Filled 2021-12-13: qty 100

## 2021-12-13 NOTE — Progress Notes (Signed)
Received call from lab on pts glucose reading. Dr Lanier Clam notified.

## 2021-12-13 NOTE — Progress Notes (Addendum)
PROGRESS NOTE   Subjective/Complaints:  Pt is anxious about Mg level. No additional concerns. Reports good day with therapy.   ROS:  Pt denies SOB, abd pain, CP, N/V/C/D, cough, and vision changes    Objective:   No results found. No results for input(s): "WBC", "HGB", "HCT", "PLT" in the last 72 hours.  Recent Labs    12/11/21 0625  NA 132*  K 4.5  CL 100  CO2 26  GLUCOSE 425*  BUN 7  CREATININE 0.66  CALCIUM 8.9     Intake/Output Summary (Last 24 hours) at 12/13/2021 1046 Last data filed at 12/13/2021 0809 Gross per 24 hour  Intake 1200 ml  Output --  Net 1200 ml         Physical Exam: Vital Signs Blood pressure 104/76, pulse 61, temperature 98.3 F (36.8 C), temperature source Oral, resp. rate 14, height 5\' 5"  (1.651 m), weight 93.9 kg, SpO2 97 %.    General: supine in bed; woke form sleep;  NAD HENT: conjugate gaze; oropharynx moist CV: regular rate; no JVD Pulmonary: CTA B/L; no W/R/R- good air movement GI: soft, NT, ND, (+)BS Psychiatric: appropriate- a little anxious Neurological: alert and awake- speech fluent, Skin: warm and dry, healing I&D R infrapatella incision Neuro:  Alert and oriented x4, follows 2 step commands,makes eye contact with examiners,  can name president, not able to state her age correctly, unable to count down by 7s after 93, short and long term memory mildly abnormal, speech normal, sensation intact to LT in all 4 extremities  FTN slightly decreased on Left Strength 5/5 in b/l UE and b/l LE Musculoskeletal:  No abnormal tone or ROM noted Claw toes b/l LE digits 2-4  Assessment/Plan: 1. Functional deficits which require 3+ hours per day of interdisciplinary therapy in a comprehensive inpatient rehab setting. Physiatrist is providing close team supervision and 24 hour management of active medical problems listed below. Physiatrist and rehab team continue to assess  barriers to discharge/monitor patient progress toward functional and medical goals  Care Tool:  Bathing    Body parts bathed by patient: Right arm, Chest, Left arm, Abdomen, Front perineal area, Buttocks, Right upper leg, Left upper leg, Right lower leg, Left lower leg, Face         Bathing assist Assist Level: Supervision/Verbal cueing     Upper Body Dressing/Undressing Upper body dressing   What is the patient wearing?: Pull over shirt    Upper body assist Assist Level: Independent    Lower Body Dressing/Undressing Lower body dressing      What is the patient wearing?: Pants     Lower body assist Assist for lower body dressing: Independent     Toileting Toileting Toileting Activity did not occur (Clothing management and hygiene only): N/A (no void or bm)  Toileting assist Assist for toileting: Supervision/Verbal cueing     Transfers Chair/bed transfer  Transfers assist     Chair/bed transfer assist level: Supervision/Verbal cueing     Locomotion Ambulation   Ambulation assist      Assist level: Contact Guard/Touching assist Assistive device: No Device Max distance: >300 feet   Walk 10 feet activity  Assist     Assist level: Contact Guard/Touching assist Assistive device: No Device   Walk 50 feet activity   Assist    Assist level: Contact Guard/Touching assist Assistive device: No Device    Walk 150 feet activity   Assist    Assist level: Contact Guard/Touching assist Assistive device: No Device    Walk 10 feet on uneven surface  activity   Assist     Assist level: Contact Guard/Touching assist Assistive device: Other (comment) (None)   Wheelchair     Assist Is the patient using a wheelchair?: Yes Type of Wheelchair: Manual    Wheelchair assist level: Supervision/Verbal cueing Max wheelchair distance: 200 feet    Wheelchair 50 feet with 2 turns activity    Assist        Assist Level:  Supervision/Verbal cueing   Wheelchair 150 feet activity     Assist      Assist Level: Supervision/Verbal cueing   Blood pressure 104/76, pulse 61, temperature 98.3 F (36.8 C), temperature source Oral, resp. rate 14, height 5\' 5"  (1.651 m), weight 93.9 kg, SpO2 97 %.  1. 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  Antithrombotics: -DVT/anticoagulation:  Pharmaceutical: Lovenox             -antiplatelet therapy: N/A 3. Pain Management: Oxycodone 5mg  prn.  As outpatient she follows with [facility] on oxycodone. --Will add lidocaine patches to chest wall 4. Mood/Behavior/Sleep: LCSW to follow for evaluation and support.  Continue Zoloft             --slept better last 2 nights after ambien d/c.  Monitor for now 8/24- will change trazodone to 75 mg QHS and give at 8pm  8/25- went much better- con't regimen             -antipsychotic agents: N/A 5. Neuropsych/cognition: This patient is capable of making decisions on his own behalf. 6. Skin/Wound Care: Routine pressure relief  measures.  7. Fluids/Electrolytes/Nutrition: Monitor I/O. Check CMET in am.  8. VT arrest/QT prolongation: Continue Metoprolol BID.  Monitor K+ and Mg. 9. Hypomagnesemia: Recheck labs in am. MG 1.4 8/23 --Will add daily supplement.  -Recheck MG and K+ today 10. Seizure d/o (GTC/starring spells/Dr. 9/25): Stable on Lacosamide 100mg  bid.  11. Depression:  Mood stable on increased dose Zoloft.  12. Acute blood loss anemia: H/H has varied from 8-10 range. Last HGB 10 8/21. Recheck in am             --monitor for signs of bleeding.  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              --continue Singulair and Budesonide BID.  14. 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 Right septic bursitis: Has completed treatment.   17. Hives            -Start prednisone dose pack and allegra  18. Transaminitis  8/24- AST 53-  will monitor for now- with pancreatic insuff.  19. Hypomagnesemia  8/24- will increase PO Mg to 800 mg daily- and will recheck in AM- might need 800 mg BID- also recheck BMP since K+ borderline low-   8/25- Mg down to 1.5- will increase Mg to 800 mg BID and recheck Monday- K+ 4.5- will also give IV Mg 1 more time for now after therapy 2 grams.   -8/27 labs planned for tomorrow however pt very anxious about MG, will check BMET and MG today -MG still low 1.4 will give 4 gram IV- discussed with pharmacy 20 hyperglycemia, likely related to prednisone taper  -glucose >500, will give 10u novolog and start correctional insulin 21. Mild azotemia and hyponatremia (asymptomatic)  -Will start IVF, likely caused by fluids loss due to hyperglycemia  LOS: 4 days A FACE TO FACE EVALUATION WAS PERFORMED  Fanny Dance 12/13/2021, 10:46 AM

## 2021-12-13 NOTE — Progress Notes (Signed)
Physical Therapy Session Note  Patient Details  Name: Kara [name] MRN: [id number] Date of Birth: Jul 13, 1985  Today's Date: 12/13/2021 PT Individual Time: 0800-0858 PT Individual Time Calculation (min): 58 min   Short Term Goals: Week 1:  PT Short Term Goal 1 (Week 1): STG=LTG due to ELOS  Skilled Therapeutic Interventions/Progress Updates:  Pt received in her room, in sitting. She denied pain.   Advanced gait training, no AD, with pt transporting PT's laptop in a bag held in her R hand, x 300' ox 2.  Surfaces included: level tile, outdoor slightly sloping paved areas, in/out of elevator.  Slight LOB on unlevel areas, with 1 mild LOB recovered independently.    Discussed pt's hx of falls, mostly at work, due to peripheral neuropathies bil LEs (sensory and motor).  Pt reported she usually falls by tripping,  about 5x/year.  PT discussed pt's choice of shoes. PT recommending avoiding high heels, loose sandals, etc; pt voiced understanding and agreement.   Pt's bil heel cords are quite tight, approximately  -10 ankle DF, and bil ankle strength DF 4/5.  PT instructed pt in self stretching bil heel cords, in sitting using a footstool and a strap, as well as runner's stretching in standing.  Pt completed standing stretches x 3 min x 1 bil LEs.   Given external perturbations in standing, from all directions, pt demonstrated delayed and inadequate ankle and hip strategies, in A-P directions.   In Therapy Gym, pt ascended/descended 4 6" high steps x 3 , R hand rail, CG/close supervision. She exhibited poor eccentric control bil ankle DF with feet slapping treads of steps when she descended.   At end of session, pt resting sitting on bed, with needs at hand, and alarm set.   She was awaiting family to visit.        Therapy Documentation Precautions:  Precautions Precautions: None Precaution Comments: Watch HR and O2 sats, Cortrak, trach, flexiseal Restrictions Weight Bearing Restrictions:  No      Therapy/Group: Individual Therapy  Kristopher Attwood 12/13/2021, 1:16 PM

## 2021-12-14 DIAGNOSIS — G931 Anoxic brain damage, not elsewhere classified: Secondary | ICD-10-CM | POA: Diagnosis not present

## 2021-12-14 LAB — GLUCOSE, CAPILLARY
Glucose-Capillary: 428 mg/dL — ABNORMAL HIGH (ref 70–99)
Glucose-Capillary: 393 mg/dL — ABNORMAL HIGH (ref 70–99)
Glucose-Capillary: 151 mg/dL — ABNORMAL HIGH (ref 70–99)
Glucose-Capillary: 190 mg/dL — ABNORMAL HIGH (ref 70–99)
Glucose-Capillary: 249 mg/dL — ABNORMAL HIGH (ref 70–99)

## 2021-12-14 LAB — CBC
HCT: 33.7 % — ABNORMAL LOW (ref 36.0–46.0)
Hemoglobin: 11.2 g/dL — ABNORMAL LOW (ref 12.0–15.0)
MCH: 30.9 pg (ref 26.0–34.0)
MCHC: 33.2 g/dL (ref 30.0–36.0)
MCV: 93.1 fL (ref 80.0–100.0)
Platelets: 143 10*3/uL — ABNORMAL LOW (ref 150–400)
RBC: 3.62 MIL/uL — ABNORMAL LOW (ref 3.87–5.11)
RDW: 12.9 % (ref 11.5–15.5)
WBC: 9.5 10*3/uL (ref 4.0–10.5)
nRBC: 0 % (ref 0.0–0.2)

## 2021-12-14 LAB — BASIC METABOLIC PANEL
Anion gap: 11 (ref 5–15)
BUN: 14 mg/dL (ref 6–20)
CO2: 25 mmol/L (ref 22–32)
Calcium: 9.3 mg/dL (ref 8.9–10.3)
Chloride: 97 mmol/L — ABNORMAL LOW (ref 98–111)
Creatinine, Ser: 0.73 mg/dL (ref 0.44–1.00)
GFR, Estimated: >60 mL/min (ref >60)
Glucose, Bld: 406 mg/dL — ABNORMAL HIGH (ref 70–99)
Potassium: 4.4 mmol/L (ref 3.5–5.1)
Sodium: 133 mmol/L — ABNORMAL LOW (ref 135–145)

## 2021-12-14 LAB — MAGNESIUM: Magnesium: 1.6 mg/dL — ABNORMAL LOW (ref 1.7–2.4)

## 2021-12-14 MED ORDER — METFORMIN HCL 500 MG PO TABS
500.0000 mg | ORAL_TABLET | Freq: Two times a day (BID) | ORAL | Status: DC
  Administered 2021-12-14 – 2021-12-15 (×3): 500 mg via ORAL
  Filled 2021-12-14 (×3): qty 1

## 2021-12-14 MED ORDER — SACCHAROMYCES BOULARDII 250 MG PO CAPS: 250.0000 mg | ORAL_CAPSULE | Freq: Two times a day (BID) | ORAL | Status: DC

## 2021-12-14 NOTE — Progress Notes (Signed)
PROGRESS NOTE   Subjective/Complaints:  Pt reports d/c tomorrow still planned.  Concerned about mg level- got IV Mg again last night.   And got 50cc hour IVFs overnight-  Sometime taking pain meds before therapy from rib fx's.  Pain ~ 6/10 when gets bad.   LBM yesterday.  Weaning prednisone- CBG's in high 300s-400s- pt said not eating much sugar/snacks.    ROS:  Pt denies SOB, abd pain, CP, N/V/C/D, and vision changes     Objective:   No results found. Recent Labs    12/14/21 0658  WBC 9.5  HGB 11.2*  HCT 33.7*  PLT 143*   Recent Labs    12/13/21 1248 12/14/21 0658  NA 128* 133*  K 4.5 4.4  CL 91* 97*  CO2 26 25  GLUCOSE 512* 406*  BUN 12 14  CREATININE 0.94 0.73  CALCIUM 9.6 9.3    Intake/Output Summary (Last 24 hours) at 12/14/2021 0938 Last data filed at 12/14/2021 0733 Gross per 24 hour  Intake 1314 ml  Output --  Net 1314 ml        Physical Exam: Vital Signs Blood pressure 118/81, pulse 63, temperature (!) 97.5 F (36.4 C), temperature source Oral, resp. rate 18, height 5\' 5"  (1.651 m), weight 93.9 kg, SpO2 98 %.     General: awake, alert, appropriate, sitting up in bed; putting on makeup; NAD HENT: conjugate gaze; oropharynx moist CV: regular rate; no JVD Pulmonary: CTA B/L; no W/R/R- good air movement GI: soft, NT, ND, (+)BS Psychiatric: appropriate- interactive Neurological: Ox3 very trace delayed responses, but much better Skin: warm and dry, healing I&D R infrapatella incision Neuro:  Alert and oriented x4, follows 2 step commands,makes eye contact with examiners,  can name president, not able to state her age correctly, unable to count down by 7s after 93, short and long term memory mildly abnormal, speech normal, sensation intact to LT in all 4 extremities  FTN slightly decreased on Left Strength 5/5 in b/l UE and b/l LE Musculoskeletal:  No abnormal tone or ROM noted Claw  toes b/l LE digits 2-4  Assessment/Plan: 1. Functional deficits which require 3+ hours per day of interdisciplinary therapy in a comprehensive inpatient rehab setting. Physiatrist is providing close team supervision and 24 hour management of active medical problems listed below. Physiatrist and rehab team continue to assess barriers to discharge/monitor patient progress toward functional and medical goals  Care Tool:  Bathing    Body parts bathed by patient: Right arm, Chest, Left arm, Abdomen, Front perineal area, Buttocks, Right upper leg, Left upper leg, Right lower leg, Left lower leg, Face         Bathing assist Assist Level: Supervision/Verbal cueing     Upper Body Dressing/Undressing Upper body dressing   What is the patient wearing?: Pull over shirt    Upper body assist Assist Level: Independent    Lower Body Dressing/Undressing Lower body dressing      What is the patient wearing?: Pants     Lower body assist Assist for lower body dressing: Independent     Toileting Toileting Toileting Activity did not occur (Clothing management and hygiene only): N/A (no  void or bm)  Toileting assist Assist for toileting: Supervision/Verbal cueing     Transfers Chair/bed transfer  Transfers assist     Chair/bed transfer assist level: Supervision/Verbal cueing     Locomotion Ambulation   Ambulation assist      Assist level: Contact Guard/Touching assist Assistive device: No Device Max distance: >300 feet   Walk 10 feet activity   Assist     Assist level: Contact Guard/Touching assist Assistive device: No Device   Walk 50 feet activity   Assist    Assist level: Contact Guard/Touching assist Assistive device: No Device    Walk 150 feet activity   Assist    Assist level: Contact Guard/Touching assist Assistive device: No Device    Walk 10 feet on uneven surface  activity   Assist     Assist level: Contact Guard/Touching  assist Assistive device: Other (comment) (None)   Wheelchair     Assist Is the patient using a wheelchair?: Yes Type of Wheelchair: Manual    Wheelchair assist level: Supervision/Verbal cueing Max wheelchair distance: 200 feet    Wheelchair 50 feet with 2 turns activity    Assist        Assist Level: Supervision/Verbal cueing   Wheelchair 150 feet activity     Assist      Assist Level: Supervision/Verbal cueing   Blood pressure 118/81, pulse 63, temperature (!) 97.5 F (36.4 C), temperature source Oral, resp. rate 18, height 5\' 5"  (1.651 m), weight 93.9 kg, SpO2 98 %.  1. 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  Antithrombotics: -DVT/anticoagulation:  Pharmaceutical: Lovenox             -antiplatelet therapy: N/A 3. Pain Management: Oxycodone 5mg  prn.  As outpatient she follows with [facility] on oxycodone. --Will add lidocaine patches to chest wall 8/28- pain controlled takes 1-2x/day- con't regimen 4. Mood/Behavior/Sleep: LCSW to follow for evaluation and support.  Continue Zoloft             --slept better last 2 nights after ambien d/c.  Monitor for now 8/24- will change trazodone to 75 mg QHS and give at 8pm  8/25- went much better- con't regimen             -antipsychotic agents: N/A 5. Neuropsych/cognition: This patient is capable of making decisions on his own behalf. 6. Skin/Wound Care: Routine pressure relief  measures.  7. Fluids/Electrolytes/Nutrition: Monitor I/O. Check CMET in am.  8. VT arrest/QT prolongation: Continue Metoprolol BID.  Monitor K+ and Mg. 9. Hypomagnesemia: Recheck labs in am. MG 1.4 8/23 --Will add daily supplement.  -Recheck MG and K+ today 10. Seizure d/o (GTC/starring  spells/Dr. 9/25): Stable on Lacosamide 100mg  bid.  11. Depression:  Mood stable on increased dose Zoloft.  12. Acute blood loss anemia: H/H has varied from 8-10 range. Last HGB 10 8/21. Recheck in am             --monitor for signs of bleeding.  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              --  continue Singulair and Budesonide BID.  14. Chronic pancreatitis: has chronic diarrhea/chronic pain (working on spinal cord stimulator)             --continue creon with meals   LBM 8/27 15. Poly neuropathy: Continue gabapentin 900 mg BID 16. Right septic bursitis: Has completed treatment.   17. Hives            -Start prednisone dose pack and allegra  18. Transaminitis  8/24- AST 53- will monitor for now- with pancreatic insuff.  19. Hypomagnesemia  8/24- will increase PO Mg to 800 mg daily- and will recheck in AM- might need 800 mg BID- also recheck BMP since K+ borderline low-   8/25- Mg down to 1.5- will increase Mg to 800 mg BID and recheck Monday- K+ 4.5- will also give IV Mg 1 more time for now after therapy 2 grams.   -8/27 labs planned for tomorrow however pt very anxious about MG, will check BMET and MG today -MG still low 1.4 will give 4 gram IV- discussed with pharmacy 20 hyperglycemia, likely related to prednisone taper  -glucose >500, will give 10u novolog and start correctional insulin  8/28- Due to prednisone- is being weaned- will start Metformin 500 mg BID to help- explained to pt not clear if will need after off prednisone- will need to d/w PCP.  21. Mild azotemia and hyponatremia (asymptomatic)  -Will start IVF, likely caused by fluids loss due to hyperglycemia  8/28- Na up to 133- will stop IVFs   I spent a total of 41   minutes on total care today- >50% coordination of care- due to  d/w nursing about CBG's-   LOS: 5 days A FACE TO FACE EVALUATION WAS PERFORMED  Lyfe Reihl 12/14/2021, 9:38 AM

## 2021-12-14 NOTE — Progress Notes (Signed)
Inpatient Rehabilitation Discharge Medication Review by a Pharmacist  A complete drug regimen review was completed for this patient to identify any potential clinically significant medication issues.  High Risk Drug Classes Is patient taking? Indication by Medication  Antipsychotic No   Anticoagulant No   Antibiotic No   Opioid Yes Oxycodone - prn pain  Antiplatelet No   Hypoglycemics/insulin Yes Metformin - DM  Vasoactive Medication Yes Metoprolol - BP  Chemotherapy No   Other Yes Atorvastatin - HLD Budesonide, montelukast - asthma, allergies Cetirizine,prednisone - hives Clonazepam - prn anxiety Sertraline - anxiety Gabapentin - neuropathy Lacosamide - seizure ppx Pantoprazole - GERD Trazodone - sleep     Type of Medication Issue Identified Description of Issue Recommendation(s)  Drug Interaction(s) (clinically significant)     Duplicate Therapy     Allergy     No Medication Administration End Date     Incorrect Dose     Additional Drug Therapy Needed     Significant med changes from prior encounter (inform family/care partners about these prior to discharge).    Other       Clinically significant medication issues were identified that warrant physician communication and completion of prescribed/recommended actions by midnight of the next day:  No  Pharmacist comments: None  Time spent performing this drug regimen review (minutes):  30 minutes   Thank you Okey Regal, PharmD

## 2021-12-14 NOTE — Progress Notes (Signed)
Physical Therapy Discharge Summary  Patient Details  Name: Kara [name] MRN: [id number] Date of Birth: 10-Jun-1985  Date of Discharge from PT service:December 14, 2021  Today's Date: 12/14/2021 PT Individual Time: 1047-1208 PT Individual Time Calculation (min): 81 min    Patient has met 8 of 8 long term goals due to improved activity tolerance, improved balance, increased strength, increased range of motion, improved attention, improved awareness, and improved coordination.  Patient to discharge at an ambulatory level Independent.       Skilled Treatment Session:   Pt received seated edge of bed and agreeable to PT session. Pt functionally independent with mobility tasks and was made independent in the room. PT assessed pain interference, sensation, coordination, strength and balance in preparation for patient's discharge. Pt (I) with gait and ambulated >1000 feet on level and unlevel surfaces including mulch and sidewalks. Pt demonstrate safe technique and independence with car transfer and stair negotiation x 4 with no rails to simulate home entry to prepare for discharge. Pt appropriately recalled directions to 1st floor atrium and ambulated without an AD throughout hospital on outside on hospital grounds on various surfaces and inclines. Session with emphasis on high level balance/coordination training with gait. Pt performed high knees, skipping, lateral side steps/shuffle and hopping (single leg and double leg support during gait. Pt required verbal cues for sequencing and coordinating tasks. Pt also safely demonstrated backward walking and walking with vertical and horizontal head turns. Pt performed LE dynamic balance activities with agility ladder while performing cognitive tasks (animal recall and mental math). Pt with decreased speed with addition of cognitive dual tasks. Pt left seated edge of bed with all needs in reach.   Reasons goals not met: N/A   Recommendation:  Patient will  benefit from ongoing skilled PT services in outpatient setting to continue to advance safe functional mobility, address ongoing impairments in high level gait and balance activities, and minimize fall risk.  Equipment: No equipment provided  Reasons for discharge: treatment goals met and discharge from hospital  Patient/family agrees with progress made and goals achieved: Yes  PT Discharge  Pain Interference Pain Interference Pain Effect on Sleep: 1. Rarely or not at all Pain Interference with Therapy Activities: 3. Frequently Pain Interference with Day-to-Day Activities: 1. Rarely or not at all Cognition Overall Cognitive Status: Impaired/Different from baseline Arousal/Alertness: Awake/alert Orientation Level: Oriented X4 Attention: Alternating;Divided Divided Attention: Impaired Divided Attention Impairment: Functional complex;Verbal complex Memory: Impaired Memory Impairment: Decreased recall of new information Awareness: Appears intact Awareness Impairment: Anticipatory impairment Problem Solving: Impaired Problem Solving Impairment: Verbal complex;Functional complex Safety/Judgment: Appears intact Sensation Sensation Light Touch: Impaired Detail Peripheral sensation comments: diminshed sensation left LE Light Touch Impaired Details: Impaired LLE Proprioception: Appears Intact Coordination Gross Motor Movements are Fluid and Coordinated: Yes Fine Motor Movements are Fluid and Coordinated: Yes Finger Nose Finger Test: intact Heel Shin Test: intact Motor  Motor Motor: Within Functional Limits Motor - Skilled Clinical Observations: mild right hip/knee weakness  Mobility Bed Mobility Bed Mobility: Rolling Right;Rolling Left;Sit to Supine;Supine to Sit Rolling Right: Independent Rolling Left: Independent Supine to Sit: Independent Sitting - Scoot to Edge of Bed: Independent Sit to Supine: Independent Transfers Transfers: Sit to [facility] Sit to  Stand: Independent Stand to Sit: Independent Stand Pivot Transfers: Independent Transfer (Assistive device): None Locomotion  Gait Ambulation: Yes Gait Assistance: Independent Gait Distance (Feet): 1000 Feet (feet) Assistive device: None Gait Gait: Yes Gait Pattern: Narrow base of support Gait velocity: decreased Stairs /  Additional Locomotion Stairs: Yes Stairs Assistance: Independent Stair Management Technique: No rails Number of Stairs: 12 Height of Stairs: 6 (inches) Ramp: Independent Curb: Independent Pick up small object from the floor assist level: Independent Pick up small object from the floor assistive device: none Wheelchair Mobility Wheelchair Mobility: No  Trunk/Postural Assessment  Cervical Assessment Cervical Assessment: Within Functional Limits Thoracic Assessment Thoracic Assessment: Within Functional Limits Lumbar Assessment Lumbar Assessment: Within Functional Limits Postural Control Postural Control: Within Functional Limits  Balance Balance Balance Assessed: Yes Static Sitting Balance Static Sitting - Balance Support: Feet supported Static Sitting - Level of Assistance: 7: Independent Dynamic Sitting Balance Dynamic Sitting - Balance Support: During functional activity;Feet supported Dynamic Sitting - Level of Assistance: 7: Independent Static Standing Balance Static Standing - Balance Support: During functional activity;No upper extremity supported Static Standing - Level of Assistance: 7: Independent Dynamic Standing Balance Dynamic Standing - Balance Support: During functional activity;No upper extremity supported Dynamic Standing - Level of Assistance: 7: Independent Dynamic Standing - Balance Activities: Reaching for weighted objects;Forward lean/weight shifting;Reaching across midline;Lateral lean/weight shifting;Reaching for objects Extremity Assessment  RLE Assessment RLE Assessment: Exceptions to [facility] General Strength Comments:  grossly 5/5 except hip flexion 4/5 LLE Assessment LLE Assessment: Within Functional Limits General Strength Comments: grossly 5/5   Tanja Port PT, DPT  12/14/2021, 4:42 PM

## 2021-12-14 NOTE — Progress Notes (Signed)
Patient ID: Kara [name], female   DOB: 12-Nov-1985, 35 y.o.   MRN: [id number]  Met with pt and Mom to see if any concerns regarding discharge tomorrow. Both are pleased with how well she has done and feel prepared for discharge. No equipment needs and made aware have made referral to [facility] for OP Rehab, they will be calling either husband or Om to schedule appointments. Both feel ready for discharge tomorrow.

## 2021-12-14 NOTE — Patient Care Conference (Cosign Needed)
Inpatient RehabilitationTeam Conference and Plan of Care Update Date: 12/14/2021   Time: 12:58 PM    Patient Name: Kara [name]      Medical Record Number: [id number]  Date of Birth: 11-04-1985 Sex: Female         Room/Bed: 4M12C/4M12C-01 Payor Info: Payor: [employer] / Plan: [facility] PPO / Product Type: *No Product type* /    Admit Date/Time:  12/09/2021  4:15 PM  Primary Diagnosis:  Anoxic brain injury [facility])  Hospital Problems: Principal Problem:   Anoxic brain injury [facility])    Expected Discharge Date: Expected Discharge Date: 12/15/21  Team Members Present: Physician leading conference: Dr. Genice Rouge Social Worker Present: Cecile Sheerer, LCSWA Nurse Present: Other (comment) Vedia Pereyra, RN) PT Present: Grier Rocher, PT OT Present: Valetta Fuller, OT SLP Present: Eilene Ghazi, SLP     Current Status/Progress Goal Weekly Team Focus  Bowel/Bladder   Continent x 2  to remain continent  toilet every 4 hours and prn   Swallow/Nutrition/ Hydration             ADL's   close S shower, LB drssing, S UB self care, close S transfer shower and toilet  mod I ADL's/ S IADL's  Caregiver training, high level A/IADL's, cog, R attention   Mobility   Independent bed, transfers, gait +1000 ft, steps with no rails  Independent  dual tasking, high level gait and balance, pt/family education   Communication             Safety/Cognition/ Behavioral Observations  mod I-to-sup A  mod I-to-sup A  complex problem solving, memory, education   Pain   Chronic pain 6/10 that resolves with PRN pain medications  to be less than 3/10 with prn medications  assess every shift, before therapy and PRN   Skin   CDI  to remain CDI  assess every shift and PRN     Discharge Planning:  Home with husband and MOm to provide 24/7 care while husband works.Pt is doing well  and progressing quickly. Will set up OP   Team Discussion: Patient is continent x 2, skin is CDI, pain  is managed with prn medications.  Patient is independent/ close supv for therapy. Patient on target to meet rehab goals: yes, ambulating 1000 ft with therapy  *See Care Plan and progress notes for long and short-term goals.   Revisions to Treatment Plan:  N/A  Teaching Needs: Safety, medications, gait/transfer training, etc  Current Barriers to Discharge: Decreased caregiver support  Possible Resolutions to Barriers: Family education, medication education, Out patient therapy     Medical Summary               I attest that I was present, lead the team conference, and concur with the assessment and plan of the team.   Jearld Adjutant 12/14/2021, 12:58 PM

## 2021-12-14 NOTE — Progress Notes (Signed)
MD Stridelman notified of am CBG results. Lab verification order already placed. Order to give 15units now and recheck CBG in 1 hour. Patient up eating breakfast. Will report off to oncoming shift.

## 2021-12-14 NOTE — Plan of Care (Signed)
  Problem: RH Balance Goal: LTG Patient will maintain dynamic standing with ADLs (OT) Description: LTG:  Patient will maintain dynamic standing balance with assist during activities of daily living (OT)  Outcome: Completed/Met   Problem: Sit to Stand Goal: LTG:  Patient will perform sit to stand in prep for activites of daily living with assistance level (OT) Description: LTG:  Patient will perform sit to stand in prep for activites of daily living with assistance level (OT) Outcome: Completed/Met   Problem: RH Grooming Goal: LTG Patient will perform grooming w/assist,cues/equip (OT) Description: LTG: Patient will perform grooming with assist, with/without cues using equipment (OT) Outcome: Completed/Met   Problem: RH Bathing Goal: LTG Patient will bathe all body parts with assist levels (OT) Description: LTG: Patient will bathe all body parts with assist levels (OT) Outcome: Completed/Met   Problem: RH Dressing Goal: LTG Patient will perform upper body dressing (OT) Description: LTG Patient will perform upper body dressing with assist, with/without cues (OT). Outcome: Completed/Met Goal: LTG Patient will perform lower body dressing w/assist (OT) Description: LTG: Patient will perform lower body dressing with assist, with/without cues in positioning using equipment (OT) Outcome: Completed/Met   Problem: RH Toileting Goal: LTG Patient will perform toileting task (3/3 steps) with assistance level (OT) Description: LTG: Patient will perform toileting task (3/3 steps) with assistance level (OT)  Outcome: Completed/Met   Problem: RH Functional Use of Upper Extremity Goal: LTG Patient will use RT/LT upper extremity as a (OT) Description: LTG: Patient will use right/left upper extremity as a stabilizer/gross assist/diminished/nondominant/dominant level with assist, with/without cues during functional activity (OT) Outcome: Completed/Met   Problem: RH Simple Meal Prep Goal: LTG Patient  will perform simple meal prep w/assist (OT) Description: LTG: Patient will perform simple meal prep with assistance, with/without cues (OT). Outcome: Completed/Met   Problem: RH Laundry Goal: LTG Patient will perform laundry w/assist, cues (OT) Description: LTG: Patient will perform laundry with assistance, with/without cues (OT). Outcome: Completed/Met   Problem: RH Light Housekeeping Goal: LTG Patient will perform light housekeeping w/assist (OT) Description: LTG: Patient will perform light housekeeping with assistance, with/without cues (OT). Outcome: Completed/Met   Problem: RH Toilet Transfers Goal: LTG Patient will perform toilet transfers w/assist (OT) Description: LTG: Patient will perform toilet transfers with assist, with/without cues using equipment (OT) Outcome: Completed/Met   Problem: RH Tub/Shower Transfers Goal: LTG Patient will perform tub/shower transfers w/assist (OT) Description: LTG: Patient will perform tub/shower transfers with assist, with/without cues using equipment (OT) Outcome: Completed/Met

## 2021-12-14 NOTE — Progress Notes (Signed)
Inpatient Rehabilitation Care Coordinator Discharge Note   Patient Details  Name: Kara [name] MRN: [id number] Date of Birth: 12/29/1985   Discharge location: HOME WITH HUSBAND AND PT'S MOM TO STAY WITH SHORT TIME TO PROIVIDE 24/7 SUPERVISION  Length of Stay: 6 DAYS  Discharge activity level: INDEPENDENT/SUPERVISION LEVEL  Home/community participation: ACTIVE  Patient response YM:EBRAXE Literacy - How often do you need to have someone help you when you read instructions, pamphlets, or other written material from your doctor or pharmacy?: Never  Patient response NM:MHWKGS Isolation - How often do you feel lonely or isolated from those around you?: Never  Services provided included: MD, RD, PT, OT, SLP, RN, CM, TR, Pharmacy, Neuropsych, SW  Financial Services:  Field seismologist Utilized: [employer]  Choices offered to/list presented to: PT AND MOM  Follow-up services arranged:  Outpatient    Outpatient Servicies: [facility] -PT OT SP WILL CALL EITHER HUSBAND OR MOM TO SCHEDULE FOLLOW UP APPOINTMENTS NO EQUIPMENT NEEDS.      Patient response to transportation need: Is the patient able to respond to transportation needs?: Yes In the past 12 months, has lack of transportation kept you from medical appointments or from getting medications?: No In the past 12 months, has lack of transportation kept you from meetings, work, or from getting things needed for daily living?: No    Comments (or additional information): MOM AND HUSBAND HAVE BEN HERE DAILY AND OBSERVED IN THERAPIES. ALL FEEL PREPARED FOR DISCHARGE HOME AND GRATEFUL PT IS DOING AS WELL AS SHE IS.   Patient/Family verbalized understanding of follow-up arrangements:  Yes  Individual responsible for coordination of the follow-up plan: Kara [name]-HUSBAND [phone] OR Kara [name]-MOM [phone]  Confirmed correct DME delivered: [name] 12/14/2021    Kara [name], [name]

## 2021-12-14 NOTE — Progress Notes (Signed)
Occupational Therapy Discharge Summary  Patient Details  Name: Kara [name] MRN: [id number] Date of Birth: February 07, 1986  Date of Discharge from OT service:December 14, 2021  Today's Date: 12/14/2021 OT Individual Time: 1300-1415 OT Individual Time Calculation (min): 75 min    Patient has met 10 of 10 long term goals due to improved activity tolerance, improved balance, postural control, ability to compensate for deficits, functional use of  RIGHT upper, RIGHT lower, LEFT upper, and LEFT lower extremity, improved attention, improved awareness, and improved coordination.  Patient to discharge at overall Modified Independent level for BADL's and S IADL's. Patient's care partner is independent to provide the necessary physical and cognitive assistance at discharge.    Reasons goals not met: n/a   Recommendation:  Patient will benefit from ongoing skilled OT services in outpatient setting to continue to advance functional skills in the area of BADL, iADL, Vocation, and Reduce care partner burden.  Equipment: No equipment provided  Reasons for discharge: treatment goals met  Patient/family agrees with progress made and goals achieved: Yes  OT Discharge Precautions/Restrictions  Precautions Precautions: None Precaution Comments: falls Restrictions Weight Bearing Restrictions: No General   Vital Signs Therapy Vitals Temp: 97.9 F (36.6 C) Pulse Rate: 80 Resp: 18 BP: 120/83 Patient Position (if appropriate): Lying Oxygen Therapy SpO2: 100 % O2 Device: Room Air Pain Pain Assessment Pain Scale: 0-10 Pain Score: 0-No pain ADL ADL Eating: Independent Where Assessed-Eating: Chair Grooming: Independent Where Assessed-Grooming: Standing at sink Upper Body Bathing: Modified independent Where Assessed-Upper Body Bathing: Sitting at sink, Standing at sink, Shower Lower Body Bathing: Modified independent Where Assessed-Lower Body Bathing: Sitting at sink, Standing at sink,  Administrator, sports Dressing: Independent Where Assessed-Upper Body Dressing: Sitting at sink Lower Body Dressing: Independent Where Assessed-Lower Body Dressing: Sitting at sink, Standing at sink, Chair Toileting: Independent Where Assessed-Toileting: Glass blower/designer: Programmer, applications Method: Counselling psychologist: Ambulance person Transfer: Modified independent Clinical cytogeneticist Method: Librarian, academic: Energy manager: Modified independent Social research officer, government Method: Heritage manager: Grab bars ADL Comments: Progrssed to mod I for BADL's without amb device. S BIADL's Vision Baseline Vision/History: 0 No visual deficits Vision Assessment?: No apparent visual deficits Perception  Perception: Within Functional Limits Praxis Praxis: Intact Cognition Cognition Overall Cognitive Status: Impaired/Different from baseline Arousal/Alertness: Awake/alert Orientation Level: Person;Place;Situation Place: Oriented Situation: Oriented Memory: Impaired Memory Impairment: Decreased recall of new information Attention: Alternating;Divided Divided Attention: Impaired Divided Attention Impairment: Functional complex;Verbal complex Awareness: Appears intact Awareness Impairment: Anticipatory impairment Problem Solving: Impaired Problem Solving Impairment: Verbal complex;Functional complex Safety/Judgment: Appears intact Brief Interview for Mental Status (BIMS) Repetition of Three Words (First Attempt): 3 Temporal Orientation: Year: Correct Temporal Orientation: Month: Accurate within 5 days Temporal Orientation: Day: Correct Recall: "Sock": Yes, no cue required Recall: "Blue": Yes, no cue required Recall: "Bed": Yes, no cue required BIMS Summary Score: 15 Sensation Sensation Light Touch: Appears Intact Hot/Cold: Appears Intact Proprioception: Appears Intact Stereognosis: Appears  Intact Coordination Gross Motor Movements are Fluid and Coordinated: Yes Fine Motor Movements are Fluid and Coordinated: Yes Finger Nose Finger Test: intact Heel Shin Test: intact 9 Hole Peg Test: 19 sec R, 22 sec L Motor  Motor Motor: Within Functional Limits Motor - Skilled Clinical Observations: mild right hip/knee weakness Motor - Discharge Observations: mild right hip/knee weakness Mobility  Bed Mobility Bed Mobility: Rolling Right;Rolling Left;Sit to Supine;Supine to Sit Rolling Right: Independent Rolling Left: Independent Supine to Sit: Independent Sitting - Scoot  to Edge of Bed: Independent Sit to Supine: Independent Transfers Sit to Stand: Independent Stand to Sit: Independent  Trunk/Postural Assessment  Cervical Assessment Cervical Assessment: Within Functional Limits Thoracic Assessment Thoracic Assessment: Within Functional Limits Lumbar Assessment Lumbar Assessment: Within Functional Limits Postural Control Postural Control: Within Functional Limits  Balance Balance Balance Assessed: Yes Static Sitting Balance Static Sitting - Balance Support: Feet supported Static Sitting - Level of Assistance: 7: Independent Dynamic Sitting Balance Dynamic Sitting - Balance Support: During functional activity;Feet supported Dynamic Sitting - Level of Assistance: 7: Independent Static Standing Balance Static Standing - Balance Support: During functional activity;No upper extremity supported Static Standing - Level of Assistance: 7: Independent Dynamic Standing Balance Dynamic Standing - Balance Support: During functional activity;No upper extremity supported Dynamic Standing - Level of Assistance: 7: Independent Dynamic Standing - Balance Activities: Reaching for weighted objects;Forward lean/weight shifting;Reaching across midline;Lateral lean/weight shifting;Reaching for objects Dynamic Standing - Comments: continues to have S needs for SLS Extremity/Trunk  Assessment RUE Assessment RUE Assessment: Within Functional Limits LUE Assessment LUE Assessment: Within Functional Limits Pt seen for final skilled OT session. Focussed on all discharge status re-assessment of ADL's including bathing, dressing, toileting and grooming with transfer safety. Pt is now performing at a mod I level.  Mom present for most of session for Family Education this session and prior during full shower and self care training with + teach back and understanding. Pt was able to then amb without AD ~150 ft x 2 to access the demo apartment therapy space for tub transfer bench training as well as accessing closet rack with hangers standing on Airex foam with S otherwise mod I. Once back in room, pt transferred with mod I to EOB and is now mod I in room and able to demonstrate understanding of pulmonary/activity tolerance, UE strength training with tbands, medium tputty, and foam grasp for UE HEP independently. No further OT needs at CIR as all goals met with rec for outpatient OT. Left pt with call button and needs in place.  Barnabas Lister 12/14/2021, 10:54 PM

## 2021-12-15 DIAGNOSIS — G931 Anoxic brain damage, not elsewhere classified: Secondary | ICD-10-CM | POA: Diagnosis not present

## 2021-12-15 DIAGNOSIS — K529 Noninfective gastroenteritis and colitis, unspecified: Secondary | ICD-10-CM

## 2021-12-15 DIAGNOSIS — S2249XA Multiple fractures of ribs, unspecified side, initial encounter for closed fracture: Secondary | ICD-10-CM

## 2021-12-15 HISTORY — DX: Hypomagnesemia: E83.42

## 2021-12-15 LAB — GLUCOSE, CAPILLARY: Glucose-Capillary: 226 mg/dL — ABNORMAL HIGH (ref 70–99)

## 2021-12-15 MED ORDER — BLOOD GLUCOSE MONITOR KIT: PACK | 0 refills | Status: AC

## 2021-12-15 MED ORDER — VITAMIN B-1 100 MG PO TABS: 100.0000 mg | ORAL_TABLET | Freq: Every day | ORAL | 0 refills | Status: AC

## 2021-12-15 MED ORDER — CETIRIZINE HCL 10 MG PO TABS: 10.0000 mg | ORAL_TABLET | Freq: Every day | ORAL | 0 refills | Status: DC

## 2021-12-15 MED ORDER — METFORMIN HCL 500 MG PO TABS: 500.0000 mg | ORAL_TABLET | Freq: Two times a day (BID) | ORAL | 0 refills | Status: AC

## 2021-12-15 MED ORDER — LIDOCAINE 5 % EX PTCH
1.0000 | MEDICATED_PATCH | CUTANEOUS | 0 refills | Status: AC
Start: 2021-12-15 — End: ?

## 2021-12-15 MED ORDER — FOLIC ACID 1 MG PO TABS: 1.0000 mg | ORAL_TABLET | Freq: Every day | ORAL | 0 refills | Status: DC

## 2021-12-15 MED ORDER — OXYCODONE HCL 15 MG PO TABS: 7.5000 mg | ORAL_TABLET | Freq: Three times a day (TID) | ORAL | 0 refills | Status: AC | PRN

## 2021-12-15 MED ORDER — SACCHAROMYCES BOULARDII 250 MG PO CAPS: 250.0000 mg | ORAL_CAPSULE | Freq: Two times a day (BID) | ORAL | 0 refills | Status: AC

## 2021-12-15 MED ORDER — GABAPENTIN 300 MG PO CAPS: 900.0000 mg | ORAL_CAPSULE | Freq: Two times a day (BID) | ORAL | 0 refills | Status: DC

## 2021-12-15 MED ORDER — TRAZODONE HCL 150 MG PO TABS: 75.0000 mg | ORAL_TABLET | Freq: Every day | ORAL | 0 refills | Status: AC

## 2021-12-15 MED ORDER — MAGNESIUM OXIDE -MG SUPPLEMENT 400 (240 MG) MG PO TABS: 800.0000 mg | ORAL_TABLET | Freq: Two times a day (BID) | ORAL | 0 refills | Status: DC

## 2021-12-15 MED ORDER — SERTRALINE HCL 100 MG PO TABS: 100.0000 mg | ORAL_TABLET | Freq: Every day | ORAL | 0 refills | Status: AC

## 2021-12-15 MED ORDER — ATORVASTATIN CALCIUM 10 MG PO TABS: 10.0000 mg | ORAL_TABLET | Freq: Every day | ORAL | 0 refills | Status: DC

## 2021-12-15 MED ORDER — ZENPEP 20000-63000 UNITS PO CPEP: 2.0000 | ORAL_CAPSULE | Freq: Three times a day (TID) | ORAL | 0 refills | Status: AC

## 2021-12-15 MED ORDER — OXYCODONE HCL 5 MG PO TABS: 5.0000 mg | ORAL_TABLET | Freq: Four times a day (QID) | ORAL | 0 refills | Status: DC | PRN

## 2021-12-15 MED ORDER — CALCIUM POLYCARBOPHIL 625 MG PO TABS: 625.0000 mg | ORAL_TABLET | Freq: Two times a day (BID) | ORAL | 0 refills | Status: AC

## 2021-12-15 MED ORDER — CLONAZEPAM 0.5 MG PO TABS: 0.5000 mg | ORAL_TABLET | Freq: Two times a day (BID) | ORAL | 0 refills | Status: DC | PRN

## 2021-12-15 MED ORDER — METOPROLOL TARTRATE 25 MG PO TABS: 25.0000 mg | ORAL_TABLET | Freq: Two times a day (BID) | ORAL | 0 refills | Status: DC

## 2021-12-15 NOTE — Discharge Instructions (Addendum)
Inpatient Rehab Discharge Instructions  Kara [name] Discharge date and time: 12/15/21   Activities/Precautions/ Functional Status: Activity: no lifting, driving, or strenuous exercise for till cleared by MD. No driving for 6 months Diet: diabetic diet Wound Care: none needed   Functional status:  ___ No restrictions     ___ Walk up steps independently ___ 24/7 supervision/assistance   ___ Walk up steps with assistance _X__ Intermittent supervision/assistance  ___ Bathe/dress independently ___ Walk with walker     ___ Bathe/dress with assistance ___ Walk Independently    ___ Shower independently ___ Walk with assistance    _X__ Shower with assistance _X__ No alcohol     ___ Return to work/school ________   Special Instructions:    COMMUNITY REFERRALS UPON DISCHARGE:   Outpatient: PT    OT    SP             Agency:[facility] [address] [place] [address]   [phone]              Appointment Date/Time:WILL CONTACT HUSBAND OR MOM TO SET UP APPOINTMENTS  Medical Equipment/Items Ordered:NONE NEEDED                                                 Agency/Supplier:NA    My questions have been answered and I understand these instructions. I will adhere to these goals and the provided educational materials after my discharge from the hospital.  Patient/Caregiver Signature _______________________________ Date __________  Clinician Signature _______________________________________ Date __________  Please bring this form and your medication list with you to all your follow-up doctor's appointments.

## 2021-12-15 NOTE — Discharge Summary (Signed)
Physician Discharge Summary  Patient ID: Kara [name] MRN: [id number] DOB/AGE: 1986-02-24 36 y.o.  Admit date: 12/09/2021 Discharge date: 12/15/2021  Discharge Diagnoses:  Principal Problem:   Anoxic brain injury [facility]) Active Problems:   Upper abdominal pain   Polyneuropathy   Alcohol abuse, in remission   Fatty liver   Malnutrition of moderate degree   Seizure disorder (HCC)   Hypomagnesemia   Chronic diarrhea   Fracture of multiple ribs   Discharged Condition: stable  Significant Diagnostic Studies: N/A   Labs:  Basic Metabolic Panel: Recent Labs  Lab 12/09/21 0920 12/10/21 0502 12/11/21 0625 12/13/21 1248 12/14/21 0658  NA 132* 138 132* 128* 133*  K 5.0 3.6 4.5 4.5 4.4  CL 100 103 100 91* 97*  CO2 21* _0 GLUCOSE 447* 152* 425* 512* 406*  BUN _1 CREATININE 0.75 0.68 0.66 0.94 0.73  CALCIUM 8.9 9.2 8.9 9.6 9.3  MG 1.4* 1.6* 1.5* 1.4* 1.6*    CBC:    Latest Ref Rng & Units 12/14/2021    6:58 AM 12/10/2021    5:02 AM 12/07/2021    9:44 AM  CBC  WBC 4.0 - 10.5 K/uL 9.5  7.7  8.2   Hemoglobin 12.0 - 15.0 g/dL 11.2  9.8  10.0   Hematocrit 36.0 - 46.0 % 33.7  29.3  31.0   Platelets 150 - 400 K/uL 143  171  207      CBG: Recent Labs  Lab 12/14/21 0728 12/14/21 1222 12/14/21 1615 12/14/21 2055 12/15/21 0601  GLUCAP 393* 151* 190* 249* 226*    Brief HPI:   Kara [name] is a 36 y.o. female with history of diastolic CHF, severe asthma, ETOH abuse-quit 2017, pancreatitis with chronic diarrhea, seizure disorder, septic bursitis right knee s/p I and D who developed N/V post procedure,  was found pulseless and apneic with downtown of about 20-30 minutes on 11/04/2021.  She was found to be in VT and cardia arrest felt to be due to prolonged QT in setting of recent addition of Seroquel as well as hypokalemia/hypomagnesemia. She was obtunded and found to have anoxic BI, difficulty with vent wean requiring tracheostomy as well as cortak for  nutritional support.   She was transferred to [facility] for management. She tolerated vent wean and was treated with broad spectrum antibiotics for PNA. Her mentation gradually improved and was started on regular diet. Antibiotics d/c due to rash and prn IV solumedrol used for rash. Oxycodone resumed for chronic abdominal pain and peripheral polyneuropathy. Intermittent hypomagnesemia continued to an issue with recurrent drop in Mg-1.4 and was treated with IV mag prior to discharge.  She has been working with PT/OT and continues to be limited by cognitive deficits, ataxic gait, weakness and chest wall pain from rib fx/CPR. CIR recommended due to functional decline.    Hospital Course: [name] was admitted to rehab 12/09/2021 for inpatient therapies to consist of PT, ST and OT at least three hours five days a week. Past admission physiatrist, therapy team and rehab RN have worked together to provide customized collaborative inpatient rehab. Her blood pressures were monitored on TID basis and has been controlled. She has been seizure free during her stay. Pain has been managed with oxycodone 5 mg tid on average. Her respiratory status has been stable and has shown steady progress with activity during her stay. Follow up CBC showed steady improvement in H/H.   Check of electrolytes showed mild azotemia  which was treated with IVF for hydration and has improved. Mild hyponatremia felt to be due to IVF which was discontinued. She had recurrent hypomagnesemia requiring IV supplemention X a few days. Oral supplement was added and titrated to 800  mg bid with recs to repeat labs in a week. She has also been educated on increasing intake of high Mg foods  Her CBG checks were discontinued at admission as BS and Hgb A1C - 4.5.  She was started on steroid taper for her rash and her BS started trending up with FBS showing BS up to 500. She was started on SSI and metformin added for better control. She has completed her  taper at discharge and was advised to continue monitoring BS ac/hs. Anticipate that BS will normalize off steroids and was advised to hold metformin for BS less than 80 and follow up with PCP for further input.  Trazodone was added to help with sleep wake disruption. She has made great gains and is modified independent in supervised setting. She will continue to receive follow up outpatient PT, OT and ST at [facility] outpatient rehab at [facility] after discharge.    Rehab course: During patient's stay in rehab weekly team conferences were held to monitor patient's progress, set goals and discuss barriers to discharge. At admission, patient required min assist with basic ADL tasks and supervision with mobility. Mild cognitive linguistic deficits noted with SLUMS score of 21/30.  She  has had improvement in activity tolerance, balance, postural control as well as ability to compensate for deficits.  She is able to complete ADL tasks at modified independent level and needs supervision with BIADLs.  She is modified independent for transfers and to ambulate > 1000' on even and uneven surfaces safely without AD.  She is able to complete functional and complex cognitive tasks at modified independent level. Family education has been completed.   Disposition: Home.   Diet: Limit sweets/starches.   Special Instructions: No driving for at least 6 months Recommend repeat BMET in 5-7 days to monitor Potassium and Magnesium level 3. Monitor BS ac/hs and decrease metformin to 250 mg bid if BS are around 100. Discontinue metformin if diarrhea occurs or worsens and if BS are below 100.   Discharge Instructions     Ambulatory referral to Physical Medicine Rehab   Complete by: As directed    Hospital follow up      Allergies as of 12/15/2021       Reactions   Maxzide [hydrochlorothiazide W-triamterene] Nausea Only, Other (See Comments)   Dizziness    Nsaids Diarrhea, Nausea And Vomiting   Other Hives, Itching,  Other (See Comments)   TREE NUT - itching, hives   Peanut Butter Flavor Hives, Itching   Peanuts [peanut Oil] Hives, Itching   Tylenol [acetaminophen] Diarrhea, Nausea And Vomiting, Other (See Comments)   Elevated LFT   Wellbutrin [bupropion] Other (See Comments)   Lowered seizure threshold        Medication List     STOP taking these medications    acetaminophen 160 MG/5ML solution Commonly known as: TYLENOL   acetaminophen 325 MG tablet Commonly known as: TYLENOL   albuterol (2.5 MG/3ML) 0.083% nebulizer solution Commonly known as: PROVENTIL   albuterol 108 (90 Base) MCG/ACT inhaler Commonly known as: VENTOLIN HFA   arformoterol 15 MCG/2ML Nebu Commonly known as: BROVANA   Breo Ellipta 200-25 MCG/ACT Aepb Generic drug: fluticasone furoate-vilanterol   Chlorhexidine Gluconate Cloth 2 % Pads   dextrose 50 %  solution   feeding supplement (VITAL AF 1.2 CAL) Liqd   GlucaGen HypoKit 1 MG Solr injection Generic drug: glucagon   heparin 5000 UNIT/ML injection   HYDROcodone-acetaminophen 5-325 MG tablet Commonly known as: NORCO/VICODIN   insulin aspart 100 UNIT/ML injection Commonly known as: novoLOG   insulin lispro 100 UNIT/ML injection Commonly known as: HUMALOG   ipratropium-albuterol 0.5-2.5 (3) MG/3ML Soln Commonly known as: DUONEB   lacosamide 100 mg in sodium chloride 0.9 % 25 mL   LORazepam 2 MG/ML injection Commonly known as: ATIVAN   MAGNESIUM SULFATE IN D5W IV   methylPREDNISolone sodium succinate 40 mg/mL injection Commonly known as: SOLU-MEDROL   metoprolol succinate 50 MG 24 hr tablet Commonly known as: TOPROL-XL   pantoprazole 2 mg/mL suspension Commonly known as: PROTONIX   pantoprazole sodium 40 mg Commonly known as: PROTONIX   potassium chloride 10 MEQ/100ML   potassium chloride 20 MEQ packet Commonly known as: KLOR-CON   potassium chloride SA 20 MEQ tablet Commonly known as: KLOR-CON M   psyllium 95 % Pack Commonly  known as: HYDROCIL/METAMUCIL   sodium chloride flush 0.9 % Soln Commonly known as: NS   sodium polystyrene 15 GM/60ML suspension Commonly known as: KAYEXALATE   topiramate 100 MG tablet Commonly known as: Topamax   zolpidem 5 MG tablet Commonly known as: AMBIEN       TAKE these medications    atorvastatin 10 MG tablet Commonly known as: LIPITOR Take 1 tablet (10 mg total) by mouth at bedtime.   blood glucose meter kit and supplies Kit Dispense based on patient and insurance preference. Use up to four times daily as directed.   budesonide 0.25 MG/2ML nebulizer solution Commonly known as: PULMICORT Take 2 mLs (0.25 mg total) by nebulization 2 (two) times daily. What changed: Another medication with the same name was removed. Continue taking this medication, and follow the directions you see here.   cetirizine 10 MG tablet Commonly known as: ZYRTEC Take 1 tablet (10 mg total) by mouth daily. Start taking on: December 16, 2021   clonazePAM 0.5 MG tablet Commonly known as: KLONOPIN Take 1 tablet (0.5 mg total) by mouth 2 (two) times daily as needed (anxiety). What changed:  medication strength how much to take how to take this when to take this reasons to take this Another medication with the same name was removed. Continue taking this medication, and follow the directions you see here.   Digestive Advantage Caps Take 1 capsule by mouth daily.   diphenhydrAMINE 25 MG tablet Commonly known as: BENADRYL Take 25 mg by mouth every 6 (six) hours as needed for itching.   EPINEPHrine 0.3 mg/0.3 mL Soaj injection Commonly known as: EpiPen 2-Pak Inject 0.3 mLs (0.3 mg total) into the muscle once as needed (for severe allergic reaction).   fiber Pack packet Take 1 packet by mouth in the morning, at noon, and at bedtime.   folic acid 1 MG tablet Commonly known as: FOLVITE Take 1 tablet (1 mg total) by mouth daily.   gabapentin 300 MG capsule Commonly known as:  NEURONTIN Take 3 capsules (900 mg total) by mouth 2 (two) times daily.   Lacosamide 100 MG Tabs Take 100 mg by mouth 2 (two) times daily.   lidocaine 5 % Commonly known as: LIDODERM Place 1 patch onto the skin daily. Remove & Discard patch within 12 hours or as directed by MD Notes to patient: Apply at  8 pm and remove at 8 am daily.  magnesium oxide 400 (240 Mg) MG tablet Commonly known as: MagOx 400 Take 2 tablets (800 mg total) by mouth 2 (two) times daily. What changed:  how much to take when to take this   melatonin 3 MG Tabs tablet Take 3 mg by mouth at bedtime.   metFORMIN 500 MG tablet Commonly known as: GLUCOPHAGE Take 1 tablet (500 mg total) by mouth 2 (two) times daily with a meal. Notes to patient: Stop taking this as your blood sugars start going below 100. Check blood sugars before meals and a bedtime for now. Drink at least 1-2 glasses of extra water if blood sugars are higher than 150.   metoprolol tartrate 25 MG tablet Commonly known as: LOPRESSOR Take 1 tablet (25 mg total) by mouth 2 (two) times daily.   montelukast 10 MG tablet Commonly known as: SINGULAIR TAKE ONE TABLET BY MOUTH AT BEDTIME What changed:  how much to take how to take this when to take this additional instructions   omeprazole 40 MG capsule Commonly known as: PRILOSEC TAKE 1 CAPSULE(40 MG) BY MOUTH DAILY   oxyCODONE 15 MG immediate release tablet Commonly known as: ROXICODONE Take 0.5 tablets (7.5 mg total) by mouth every 8 (eight) hours as needed for pain. This is an increase from  5 mg to 7.5 mg three times a day as needed. Ninety pills were filled on 10/21/21 prior to your admission on 07/19. Now that you are taking less you should have almost a month supply. What changed:  medication strength how much to take when to take this reasons to take this additional instructions   polycarbophil 625 MG tablet Commonly known as: FIBERCON Take 1 tablet (625 mg total) by mouth 2  (two) times daily.   polyvinyl alcohol 1.4 % ophthalmic solution Commonly known as: LIQUIFILM TEARS Place 1 drop into both eyes as needed for dry eyes.   saccharomyces boulardii 250 MG capsule Commonly known as: FLORASTOR Take 1 capsule (250 mg total) by mouth 2 (two) times daily.   sertraline 100 MG tablet Commonly known as: ZOLOFT Take 1 tablet (100 mg total) by mouth daily. What changed: Another medication with the same name was removed. Continue taking this medication, and follow the directions you see here.   thiamine 100 MG tablet Commonly known as: Vitamin B-1 Take 1 tablet (100 mg total) by mouth daily. What changed: how to take this   traZODone 150 MG tablet Commonly known as: DESYREL Take 0.5 tablets (75 mg total) by mouth at bedtime.   Zenpep 20000-63000 units Cpep Generic drug: Pancrelipase (Lip-Prot-Amyl) Take 2 capsules by mouth 3 (three) times daily.        Follow-up Information     Lawerance Cruel, MD Follow up.   Specialty: Family Medicine Why: Call in 1-2 days for post hospital follow up Contact information: [place] [address] [phone]         Courtney Heys, MD Follow up.   Specialty: Physical Medicine and Rehabilitation Why: office will call you with follow up appointment Contact information: [address] [place] [address] [phone]         Josue Hector, MD Follow up.   Specialty: Cardiology Why: Call in 1-2 days for post hospital follow up Contact information: [address] [place] [address] [phone]         Britt Bottom, MD Follow up.   Specialty: Neurology Why: Call in 1-2 days for  post hospital follow up Contact information: [place] [address] [phone]         [facility] Follow up.   Why: Call in 1-2 days for post hospital follow up and refills on pain medications Contact information: [place] [address] [phone]                 Signed: Bary Leriche 12/15/2021, 11:42 AM

## 2021-12-15 NOTE — Progress Notes (Signed)
PROGRESS NOTE   Subjective/Complaints:  Pt reports ready to go home- is mod I in room Discussed therapy- suggest going to [facility] initially and then when doesn't need SLP/OT, can switch to [facility] for aquatic therapy.    ROS:  Pt denies SOB, abd pain, CP, N/V/C/D, and vision changes     Objective:   No results found. Recent Labs    12/14/21 0658  WBC 9.5  HGB 11.2*  HCT 33.7*  PLT 143*   Recent Labs    12/13/21 1248 12/14/21 0658  NA 128* 133*  K 4.5 4.4  CL 91* 97*  CO2 26 25  GLUCOSE 512* 406*  BUN 12 14  CREATININE 0.94 0.73  CALCIUM 9.6 9.3   No intake or output data in the 24 hours ending 12/15/21 0830       Physical Exam: Vital Signs Blood pressure 111/83, pulse (!) 57, temperature (!) 97.5 F (36.4 C), temperature source Oral, resp. rate 18, height 5\' 5"  (1.651 m), weight 93.9 kg, SpO2 97 %.      General: awake, alert, appropriate, laying in bed; NAD HENT: conjugate gaze; oropharynx moist CV: regular rate; no JVD Pulmonary: CTA B/L; no W/R/R- good air movement GI: soft, NT, ND, (+)BS Psychiatric: appropriate Neurological: Ox3- trace delayed responses- but much better Skin: warm and dry, healing I&D R infrapatella incision Neuro:  Alert and oriented x4, follows 2 step commands,makes eye contact with examiners,  can name president, not able to state her age correctly, unable to count down by 7s after 93, short and long term memory mildly abnormal, speech normal, sensation intact to LT in all 4 extremities  FTN slightly decreased on Left Strength 5/5 in b/l UE and b/l LE Musculoskeletal:  No abnormal tone or ROM noted Claw toes b/l LE digits 2-4  Assessment/Plan: 1. Functional deficits which require 3+ hours per day of interdisciplinary therapy in a comprehensive inpatient rehab setting. Physiatrist is providing close team supervision and 24 hour management of active medical  problems listed below. Physiatrist and rehab team continue to assess barriers to discharge/monitor patient progress toward functional and medical goals  Care Tool:  Bathing    Body parts bathed by patient: Right arm, Chest, Left arm, Abdomen, Front perineal area, Buttocks, Right upper leg, Left upper leg, Right lower leg, Left lower leg, Face         Bathing assist Assist Level: Independent with assistive device     Upper Body Dressing/Undressing Upper body dressing   What is the patient wearing?: Pull over shirt    Upper body assist Assist Level: Independent    Lower Body Dressing/Undressing Lower body dressing      What is the patient wearing?: Pants     Lower body assist Assist for lower body dressing: Independent     Toileting Toileting Toileting Activity did not occur (Clothing management and hygiene only): N/A (no void or bm)  Toileting assist Assist for toileting: Independent     Transfers Chair/bed transfer  Transfers assist     Chair/bed transfer assist level: Independent     Locomotion Ambulation   Ambulation assist      Assist level: Independent Assistive device:  No Device Max distance: >1000 feet   Walk 10 feet activity   Assist     Assist level: Independent Assistive device: No Device   Walk 50 feet activity   Assist    Assist level: Independent Assistive device: No Device    Walk 150 feet activity   Assist    Assist level: Independent Assistive device: No Device    Walk 10 feet on uneven surface  activity   Assist     Assist level: Independent Assistive device: Other (comment) (no device)   Wheelchair     Assist Is the patient using a wheelchair?: No Type of Wheelchair: Manual Wheelchair activity did not occur: N/A  Wheelchair assist level: Supervision/Verbal cueing Max wheelchair distance: 200 feet    Wheelchair 50 feet with 2 turns activity    Assist    Wheelchair 50 feet with 2 turns  activity did not occur: N/A   Assist Level: Supervision/Verbal cueing   Wheelchair 150 feet activity     Assist  Wheelchair 150 feet activity did not occur: N/A   Assist Level: Supervision/Verbal cueing   Blood pressure 111/83, pulse (!) 57, temperature (!) 97.5 F (36.4 C), temperature source Oral, resp. rate 18, height 5\' 5"  (1.651 m), weight 93.9 kg, SpO2 97 %.  1. 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  Antithrombotics: -DVT/anticoagulation:  Pharmaceutical: Lovenox             -antiplatelet therapy: N/A 3. Pain Management: Oxycodone 5mg  prn.  As outpatient she follows with [facility] on oxycodone. --Will add lidocaine patches to chest wall 8/28- pain controlled takes 1-2x/day- con't regimen 8/29- taking pain meds ~1-2x/day- will give meds/goes to [facility] for pain meds 4. Mood/Behavior/Sleep: LCSW to follow for evaluation and support.  Continue Zoloft             --slept better last 2 nights after ambien d/c.  Monitor for now 8/24- will change trazodone to 75 mg QHS and give at 8pm  8/25- went much better- con't regimen             -antipsychotic agents: N/A 5. Neuropsych/cognition: This patient is capable of making decisions on his own behalf. 6. Skin/Wound Care: Routine pressure relief  measures.  7. Fluids/Electrolytes/Nutrition: Monitor I/O. Check CMET in am.  8. VT arrest/QT prolongation: Continue Metoprolol BID.  Monitor K+ and Mg. 9. Hypomagnesemia: Recheck labs in am. MG 1.4 8/23 --Will add daily supplement.  -Recheck MG and K+ today 10. Seizure d/o (GTC/starring spells/Dr. 9/25): Stable on Lacosamide 100mg  bid.  11. Depression:  Mood stable on increased dose Zoloft.  12. Acute blood loss anemia: H/H has varied from 8-10 range. Last  HGB 10 8/21. Recheck in am             --monitor for signs of bleeding.  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              --continue Singulair and Budesonide BID.  14. Chronic pancreatitis: has chronic diarrhea/chronic pain (working on spinal cord stimulator)             --  continue creon with meals   LBM 8/27 15. Poly neuropathy: Continue gabapentin 900 mg BID 16. Right septic bursitis: Has completed treatment.   17. Hives            -Start prednisone dose pack and allegra  18. Transaminitis  8/24- AST 53- will monitor for now- with pancreatic insuff.  19. Hypomagnesemia  8/24- will increase PO Mg to 800 mg daily- and will recheck in AM- might need 800 mg BID- also recheck BMP since K+ borderline low-   8/25- Mg down to 1.5- will increase Mg to 800 mg BID and recheck Monday- K+ 4.5- will also give IV Mg 1 more time for now after therapy 2 grams.   -8/27 labs planned for tomorrow however pt very anxious about MG, will check BMET and MG today -MG still low 1.4 will give 4 gram IV- discussed with pharmacy 20 hyperglycemia, likely related to prednisone taper  -glucose >500, will give 10u novolog and start correctional insulin  8/28- Due to prednisone- is being weaned- will start Metformin 500 mg BID to help- explained to pt not clear if will need after off prednisone- will need to d/w PCP.   8/29- CBG's doing better on metformin- likely won't need off prednisone 21. Mild azotemia and hyponatremia (asymptomatic)  -Will start IVF, likely caused by fluids loss due to hyperglycemia  8/28- Na up to 133- will stop IVFs  8/29- advised pt to go home and take Mg and get labs checked for Mg and BMP within the week since can drop Mg.    LOS: 6 days A FACE TO FACE EVALUATION WAS PERFORMED  Kara [name] 12/15/2021, 8:30 AM

## 2021-12-15 NOTE — Progress Notes (Signed)
INPATIENT REHABILITATION DISCHARGE NOTE   Discharge instructions by: Elita Quick PA  Verbalized understanding:yes  Skin care/Wound care healing?none  Pain:none  IV's:none  Tubes/Drains:none  O2:none  Safety instructions:done  Patient belongings: done Discharged [id number]  Discharged via: Wheelchair accompanied by NT Notes:

## 2021-12-16 ENCOUNTER — Telehealth: Payer: Self-pay | Admitting: Neurology

## 2021-12-16 MED ORDER — LACOSAMIDE 100 MG PO TABS: 100.0000 mg | ORAL_TABLET | Freq: Two times a day (BID) | ORAL | 5 refills | Status: AC

## 2021-12-16 NOTE — Telephone Encounter (Signed)
Called the patient and it appears per documentation they  stopped her topiramate that Dr Epimenio Foot had her on and started her on lacosamide 100 mg BID. They advised her to resume at dc but didn't send her with a script. Advised I would bring this to Dr Bonnita Hollow attention and have him forward a prescription in for her.  Dr Epimenio Foot agrees to this plan.

## 2021-12-16 NOTE — Telephone Encounter (Signed)
Pt was in hospital for a month and was released yesterday and is needing to discuss with the RN or Provider her seizure medications that she was on. Pt states that the hospital took her off of them due to them contributing to her heart problems. Please advise.

## 2021-12-17 NOTE — Progress Notes (Signed)
Office Visit    Patient Name: [name] Date of Encounter: 12/18/2021  Primary Care Provider:  Lawerance Cruel, MD Primary Cardiologist:  Jenkins Rouge, MD Primary Electrophysiologist: None  Chief Complaint    Kara [name] is a 36 y.o. 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  Past Medical History    Past Medical History:  Diagnosis Date   Alcoholism in remission ([place])    per pt in remission since 2017   Anemia    AR (allergic rhinitis)    allergist-- dr Neldon Mc   Biliary dyskinesia    Diastolic CHF ([place])    followd by dr Mamie Nick. Johnsie Cancel   Environmental allergies    GERD (gastroesophageal reflux disease)    Headache    History of acute pancreatitis    alcoholic pancreatitis ,  7416 and 2017   History of sepsis 06/12/2016   with CAP   History of syncope    History of thrombosis 01/29/2015   splenic vein thrombosis-- treated with coumadin--- readmitted for abd. hematoma due to coumadin treated with IR embolization of the IMA branches   Hypertension    Polyneuropathy    Prolonged QT syndrome    cardiologist-- dr Johnsie Cancel (notes in epic)   Severe persistent asthma    pulmonologist-- dr dr Bernita Raisin (notes in epic)   Past Surgical History:  Procedure Laterality Date   CHOLECYSTECTOMY N/A 06/21/2018   Procedure: LAPAROSCOPIC CHOLECYSTECTOMY;  Surgeon: Coralie Keens, MD;  Location: [facility];  Service: General;  Laterality: N/A;   ESOPHAGOGASTRODUODENOSCOPY N/A 07/17/2014   Procedure: ESOPHAGOGASTRODUODENOSCOPY (EGD);  Surgeon: Teena Irani, MD;  Location: [facility];  Service: Endoscopy;  Laterality: N/A;   TYMPANOSTOMY TUBE PLACEMENT Bilateral 11 MONTHS OLD   VIDEO BRONCHOSCOPY Bilateral 10/08/2016   Procedure: VIDEO BRONCHOSCOPY WITH FLUORO;  Surgeon: Marshell Garfinkel, MD;  Location: [facility];   Service: Cardiopulmonary;  Laterality: Bilateral;    Allergies  Allergies  Allergen Reactions   Maxzide [Hydrochlorothiazide W-Triamterene] Nausea Only and Other (See Comments)    Dizziness    Nsaids Diarrhea and Nausea And Vomiting   Other Hives, Itching and Other (See Comments)    TREE NUT - itching, hives   Peanut Butter Flavor Hives and Itching   Peanuts [Peanut Oil] Hives and Itching   Tylenol [Acetaminophen] Diarrhea, Nausea And Vomiting and Other (See Comments)    Elevated LFT    Wellbutrin [Bupropion] Other (See Comments)    Lowered seizure threshold    History of Present Illness    Kara [name] is a 36 year old female with the above-mentioned past medical history who presents today for hospital follow-up of recent VF arrest.  Kara [name] was originally seen by our practice in 2016 for complaint of palpitations and syncope.  Patient had witnessed syncopal episode with nausea and vomiting.  She has a past medical history significant for EtOH abuse.  Patient's heart rate on initial arrival was 110-130 bpm and QTc was greater than 600.  2D echo was completed that showed EF of 55-60% and grade 2 DD.  She had a 30-day event monitor was also placed that showed sinus rhythm and sinus tach with no significant arrhythmias.  She was started on beta-blockers.  It was thought that prolonged QTc was due to electrolyte abnormalities.  Repeat 2D echo in 2018 with  EF 60 to 65%, normal wall motion, trivial  TR.   She was lost to follow-up until 10/2021 when she presented to the hospital following VF arrest with DCCV in the field by EMS.  She was recently admitted due to right lower extremity cellulitis with negative venous Dopplers for DVT.  Her course was complicated by hypokalemia and hypomagnesemia.  She was discharged with antibiotics with limited Dilaudid prescription.  On 12/09/21 she developed agonal breathing following a shower at home. Her husband called 911 when EMS arrived they began CPR  with DCCV x4 and 6 rounds of epi. Patient was intubated and sedated on epi drip.  2D echo completed showing EF of 60-65%, no RWMA, valvular abnormalities.  EP was consulted and patient deemed not invasive candidate for EP procedures.  Patient remained stable during hospitalization without further arrhythmias and prolonged QTc improved with electrolyte treatment.   Kara [name] presents today with her mother for posthospital follow-up.  Since last being seen in the office patient reports she has been feeling of little bit better.  She has not had any palpitations but does endorse chest discomfort that is related to multiple broken ribs from CPR.  Her blood pressure today was well controlled at 118/78 and heart rate was 94.  She is recently started PT and OT and has tolerated well.  She does endorse some fatigue but had recently finished a session with PT OT prior to her appointment.  Patient denies cardiac related chest pain, palpitations, dyspnea, PND, orthopnea, nausea, vomiting, dizziness, syncope, edema, weight gain, or early satiety.   Home Medications    Current Outpatient Medications  Medication Sig Dispense Refill   atorvastatin (LIPITOR) 10 MG tablet Take 1 tablet (10 mg total) by mouth at bedtime. 30 tablet 0   blood glucose meter kit and supplies KIT Dispense based on patient and insurance preference. Use up to four times daily as directed. 1 each 0   budesonide (PULMICORT) 0.25 MG/2ML nebulizer solution Take 2 mLs (0.25 mg total) by nebulization 2 (two) times daily. 60 mL 12   cetirizine (ZYRTEC) 10 MG tablet Take 1 tablet (10 mg total) by mouth daily. 30 tablet 0   clonazePAM (KLONOPIN) 0.5 MG tablet Take 1 tablet (0.5 mg total) by mouth 2 (two) times daily as needed (anxiety). 30 tablet 0   diphenhydrAMINE (BENADRYL) 25 MG tablet Take 25 mg by mouth every 6 (six) hours as needed for itching.     EPINEPHrine (EPIPEN 2-PAK) 0.3 mg/0.3 mL IJ SOAJ injection Inject 0.3 mLs (0.3 mg total) into  the muscle once as needed (for severe allergic reaction). 2 Device 1   fiber (NUTRISOURCE FIBER) PACK packet Take 1 packet by mouth in the morning, at noon, and at bedtime.     folic acid (FOLVITE) 1 MG tablet Take 1 tablet (1 mg total) by mouth daily. 30 tablet 0   gabapentin (NEURONTIN) 300 MG capsule Take 3 capsules (900 mg total) by mouth 2 (two) times daily. 180 capsule 0   Lacosamide 100 MG TABS Take 1 tablet (100 mg total) by mouth 2 (two) times daily. 60 tablet 5   lidocaine (LIDODERM) 5 % Place 1 patch onto the skin daily. Remove & Discard patch within 12 hours or as directed by MD 30 patch 0   magnesium oxide (MAGOX 400) 400 (240 Mg) MG tablet Take 2 tablets (800 mg total) by mouth 2 (two) times daily. 120 tablet 0   melatonin 3 MG TABS tablet Take 3 mg by mouth at bedtime.     metFORMIN (  GLUCOPHAGE) 500 MG tablet Take 1 tablet (500 mg total) by mouth 2 (two) times daily with a meal. 60 tablet 0   metoprolol tartrate (LOPRESSOR) 25 MG tablet Take 1 tablet (25 mg total) by mouth 2 (two) times daily. 60 tablet 0   montelukast (SINGULAIR) 10 MG tablet TAKE ONE TABLET BY MOUTH AT BEDTIME (Patient taking differently: Take 10 mg by mouth at bedtime.) 30 tablet 0   naloxone (NARCAN) nasal spray 4 mg/0.1 mL SMARTSIG:Both Nares     omeprazole (PRILOSEC) 40 MG capsule TAKE 1 CAPSULE(40 MG) BY MOUTH DAILY 30 capsule 0   oxyCODONE (ROXICODONE) 15 MG immediate release tablet Take 0.5 tablets (7.5 mg total) by mouth every 8 (eight) hours as needed for pain. This is an increase from  5 mg to 7.5 mg three times a day as needed. Ninety pills were filled on 10/21/21 prior to your admission on 07/19. Now that you are taking less you should have almost a month supply. 30 tablet 0   Pancrelipase, Lip-Prot-Amyl, (ZENPEP) 20000-63000 units CPEP Take 2 capsules by mouth 3 (three) times daily. 180 capsule 0   polycarbophil (FIBERCON) 625 MG tablet Take 1 tablet (625 mg total) by mouth 2 (two) times daily. 60 tablet  0   polyvinyl alcohol (LIQUIFILM TEARS) 1.4 % ophthalmic solution Place 1 drop into both eyes as needed for dry eyes. 15 mL 0   Probiotic Product (DIGESTIVE ADVANTAGE) CAPS Take 1 capsule by mouth daily.     saccharomyces boulardii (FLORASTOR) 250 MG capsule Take 1 capsule (250 mg total) by mouth 2 (two) times daily. 60 capsule 0   sertraline (ZOLOFT) 100 MG tablet Take 1 tablet (100 mg total) by mouth daily. 30 tablet 0   thiamine (VITAMIN B-1) 100 MG tablet Take 1 tablet (100 mg total) by mouth daily. 30 tablet 0   traZODone (DESYREL) 150 MG tablet Take 0.5 tablets (75 mg total) by mouth at bedtime. 30 tablet 0   No current facility-administered medications for this visit.     Review of Systems  Please see the history of present illness.    (+) Tiredness (+) Chest pain from fractured ribs  All other systems reviewed and are otherwise negative except as noted above.  Physical Exam    Wt Readings from Last 3 Encounters:  12/18/21 189 lb (85.7 kg)  12/09/21 207 lb (93.9 kg)  12/09/21 207 lb (93.9 kg)   VS: Vitals:   12/18/21 1326  BP: 118/78  Pulse: 94  SpO2: 97%  ,Body mass index is 31.45 kg/m.  Constitutional:      Appearance: Healthy appearance. Not in distress.  Neck:     Vascular: JVD normal.  Pulmonary:     Effort: Pulmonary effort is normal.     Breath sounds: No wheezing. No rales. Diminished in the bases Cardiovascular:     Normal rate. Regular rhythm. Normal S1. Normal S2.      Murmurs: There is no murmur.  Edema:    Peripheral edema absent.  Abdominal:     Palpations: Abdomen is soft non tender. There is no hepatomegaly.  Skin:    General: Skin is warm and dry.  Neurological:     General: No focal deficit present.     Mental Status: Alert and oriented to person, place and time.     Cranial Nerves: Cranial nerves are intact.  EKG/LABS/Other Studies Reviewed    ECG personally reviewed by me today -sinus rhythm with rate of 99 bpm and right  axis  deviation with QT interval of 467   Lab Results  Component Value Date   WBC 9.5 12/14/2021   HGB 11.2 (L) 12/14/2021   HCT 33.7 (L) 12/14/2021   MCV 93.1 12/14/2021   PLT 143 (L) 12/14/2021   Lab Results  Component Value Date   CREATININE 0.73 12/14/2021   BUN 14 12/14/2021   NA 133 (L) 12/14/2021   K 4.4 12/14/2021   CL 97 (L) 12/14/2021   CO2 25 12/14/2021   Lab Results  Component Value Date   ALT 17 12/10/2021   AST 53 (H) 12/10/2021   GGT 222 (H) 10/26/2019   ALKPHOS 221 (H) 12/10/2021   BILITOT 0.9 12/10/2021   Lab Results  Component Value Date   CHOL 174 09/09/2015   HDL 57 09/09/2015   LDLCALC 100 (H) 09/09/2015   TRIG 112 12/05/2021   CHOLHDL 3.1 09/09/2015    Lab Results  Component Value Date   HGBA1C 4.5 (L) 11/27/2021    Assessment & Plan    1.  VT VF arrest/prolonged QT: -Patient recently hospitalized and treated following VF arrest -Today patient states that she is doing well with the exception of fractured ribs from CPR. -She denies any dizziness or palpitations -EKG completed with QT of 467 which is slightly above normal of 460  -She has started PT OT and is currently tolerating well  2.  HFpEF: -2D echo completed showing EF of 60-65%, no RWMA, valvular abnormalities.  -Low sodium diet, fluid restriction <2L, and daily weights encouraged. Educated to contact our office for weight gain of 2 lbs overnight or 5 lbs in one week.   3.  Hypertension: -Patient's blood pressure today was well controlled at 118/78 -She was instructed to continue to check her blood pressures at home  4.  Hypomagnesia: -Last magnesium was slightly below normal at 1.6 -Continue magnesium oxalate daily  5.  DM type II: -Patient states blood sugars are slowly normalizing are partly elevated due steroids -Continue current treatment plan per PCP  Disposition: Follow-up with Jenkins Rouge, MD or APP in 1 months    Medication Adjustments/Labs and Tests Ordered: Current  medicines are reviewed at length with the patient today.  Concerns regarding medicines are outlined above.   Signed, Mable Fill, Marissa Nestle, NP 12/18/2021, 2:10 PM [place]

## 2021-12-18 ENCOUNTER — Ambulatory Visit: Payer: BC Managed Care – PPO | Attending: Physical Medicine and Rehabilitation | Admitting: Occupational Therapy

## 2021-12-18 ENCOUNTER — Other Ambulatory Visit: Payer: Self-pay

## 2021-12-18 ENCOUNTER — Encounter: Payer: Self-pay | Admitting: Nurse Practitioner

## 2021-12-18 ENCOUNTER — Ambulatory Visit: Payer: BC Managed Care – PPO | Admitting: Physical Therapy

## 2021-12-18 ENCOUNTER — Ambulatory Visit: Payer: BC Managed Care – PPO | Attending: Nurse Practitioner | Admitting: Nurse Practitioner

## 2021-12-18 ENCOUNTER — Ambulatory Visit: Payer: BC Managed Care – PPO

## 2021-12-18 ENCOUNTER — Encounter: Payer: Self-pay | Admitting: Physical Therapy

## 2021-12-18 VITALS — BP 118/78 | HR 94 | Ht 65.0 in | Wt 189.0 lb

## 2021-12-18 DIAGNOSIS — R278 Other lack of coordination: Secondary | ICD-10-CM | POA: Insufficient documentation

## 2021-12-18 DIAGNOSIS — R41841 Cognitive communication deficit: Secondary | ICD-10-CM

## 2021-12-18 DIAGNOSIS — R2689 Other abnormalities of gait and mobility: Secondary | ICD-10-CM | POA: Diagnosis present

## 2021-12-18 DIAGNOSIS — I5032 Chronic diastolic (congestive) heart failure: Secondary | ICD-10-CM | POA: Diagnosis not present

## 2021-12-18 DIAGNOSIS — I10 Essential (primary) hypertension: Secondary | ICD-10-CM

## 2021-12-18 DIAGNOSIS — R2681 Unsteadiness on feet: Secondary | ICD-10-CM | POA: Insufficient documentation

## 2021-12-18 DIAGNOSIS — I469 Cardiac arrest, cause unspecified: Secondary | ICD-10-CM

## 2021-12-18 DIAGNOSIS — M6281 Muscle weakness (generalized): Secondary | ICD-10-CM | POA: Diagnosis present

## 2021-12-18 DIAGNOSIS — R41840 Attention and concentration deficit: Secondary | ICD-10-CM | POA: Diagnosis present

## 2021-12-18 NOTE — Therapy (Signed)
OUTPATIENT OCCUPATIONAL THERAPY NEURO EVALUATION  Patient Name: Kara [name] MRN: [id number] DOB:November 26, 1985, 36 y.o., female Today's Date: 12/18/2021  PCP: [name], MD REFERRING PROVIDER: Genice Rouge, MD    OT End of Session - 12/18/21 [phone]     Visit Number 1    Number of Visits 9    Date for OT Re-Evaluation 02/12/22    Authorization Type [employer]    OT Start Time [id number]    OT Stop Time 0935    OT Time Calculation (min) 39 min    Activity Tolerance Patient tolerated treatment well    Behavior During Therapy [facility] for tasks assessed/performed             Past Medical History:  Diagnosis Date   Alcoholism in remission (HCC)    per pt in remission since 2017   Anemia    AR (allergic rhinitis)    allergist-- dr Lucie Leather   Biliary dyskinesia    Diastolic CHF (HCC)    followd by dr Demetrius Charity. [facility] Emms   Environmental allergies    GERD (gastroesophageal reflux disease)    Headache    History of acute pancreatitis    alcoholic pancreatitis ,  2016 and 2017   History of sepsis 06/12/2016   with CAP   History of syncope    History of thrombosis 01/29/2015   splenic vein thrombosis-- treated with coumadin--- readmitted for abd. hematoma due to coumadin treated with IR embolization of the IMA branches   Hypertension    Polyneuropathy    Prolonged QT syndrome    cardiologist-- dr Eden Emms (notes in epic)   Severe persistent asthma    pulmonologist-- dr dr Belva Crome (notes in epic)   Past Surgical History:  Procedure Laterality Date   CHOLECYSTECTOMY N/A 06/21/2018   Procedure: LAPAROSCOPIC CHOLECYSTECTOMY;  Surgeon: Abigail Miyamoto, MD;  Location: [facility];  Service: General;  Laterality: N/A;   ESOPHAGOGASTRODUODENOSCOPY N/A 07/17/2014   Procedure: ESOPHAGOGASTRODUODENOSCOPY (EGD);  Surgeon: Dorena Cookey, MD;  Location: [facility];  Service: Endoscopy;  Laterality: N/A;   TYMPANOSTOMY TUBE PLACEMENT Bilateral 11 MONTHS OLD   VIDEO BRONCHOSCOPY Bilateral 10/08/2016   Procedure:  VIDEO BRONCHOSCOPY WITH FLUORO;  Surgeon: Chilton Greathouse, MD;  Location: [facility];  Service: Cardiopulmonary;  Laterality: Bilateral;   Patient Active Problem List   Diagnosis Date Noted   Hypomagnesemia 12/15/2021   Chronic diarrhea 12/15/2021   Fracture of multiple ribs 12/15/2021   Anoxic brain injury (HCC) 12/09/2021   Cardiac arrest (HCC) 11/27/2021   Seizure disorder (HCC) 11/27/2021   Chronic anoxic encephalopathy (HCC) 11/27/2021   Tracheostomy status (HCC) 11/27/2021   AKI (acute kidney injury) (HCC)    Seizure (HCC)    Altered mental status    Cardiac arrest (HCC) 11/13/2021   Acute respiratory failure (HCC)    Prolonged Q-T interval on ECG    Hypokalemia    Acute encephalopathy    Cellulitis of right lower extremity 11/04/2021   Septic prepatellar bursitis of right knee 11/04/2021   Generalized tonic-clonic seizure (HCC) 01/20/2021   Ataxia 01/20/2021   Depression 08/05/2020   Acute on chronic pancreatitis (HCC) 11/06/2019   Elevated CK    Malnutrition of moderate degree 10/23/2019   Pancreatic pseudocyst    Pancreatic cyst    Fatty liver    Other allergic rhinitis 05/03/2019   Food allergy 05/03/2019   Tobacco smoker, less than 10 cigarettes per day 05/03/2019   Asthma, severe persistent, well-controlled 11/11/2017   Lung infiltrate    Hyponatremia  08/27/2016   Hyperglycemia 08/27/2016   Acute on chronic respiratory failure with hypoxia (HCC) 08/26/2016   HCAP (healthcare-associated pneumonia) 07/31/2016   Tobacco abuse 07/31/2016   Respiratory failure with hypoxia (HCC) 07/31/2016   CAP (community acquired pneumonia) 06/13/2016   Asthma exacerbation 06/12/2016   Alcohol abuse, in remission 06/12/2016   Sepsis (HCC) 06/12/2016   Chronic pancreatitis (HCC) 09/09/2015   Gastroesophageal reflux disease 09/09/2015   Benign essential HTN 09/09/2015   FUO (fever of unknown origin)    Abdominal hematoma 02/10/2015   Warfarin-induced coagulopathy (HCC)  02/10/2015   Hypertension 02/10/2015   Hypoalbuminemia due to protein-calorie malnutrition (HCC) 02/10/2015   Alcoholic pancreatitis 02/10/2015   Acute blood loss anemia    Intra-abdominal hematoma 02/09/2015   Pancreatitis, acute    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   Acute pancreatitis 01/29/2015   Sinus tachycardia 01/29/2015   Leukocytosis 01/29/2015   AKI (acute kidney injury) (HCC) 01/29/2015   Insomnia 10/29/2014   Polyneuropathy 09/02/2014   History of alcohol abuse 09/02/2014   Edema 09/02/2014   Neck pain 09/02/2014   Chronic lower back pain 09/02/2014   Numbness of foot 08/02/2014   Hand numbness 08/02/2014   Ataxic gait 08/02/2014   Proximal leg weakness 08/02/2014   Constipation    Abdominal pain 07/16/2014   Hypothermia 07/16/2014   Upper abdominal pain    Abdominal pain, epigastric 07/06/2014   Obesity (BMI 30-39.9) 07/06/2014   Chronic diastolic heart failure (HCC) 07/06/2014   Syncope 04/08/2014   Prolonged QT interval 04/08/2014   Hypokalemia 04/08/2014   Elevated LFTs 04/08/2014    ONSET DATE: 11/13/21  REFERRING DIAG:  G93.1 (ICD-10-CM) - Anoxic brain damage, not elsewhere classified  G40.909 (ICD-10-CM) - Epilepsy, unspecified, not intractable, without status epilepticus    THERAPY DIAG:  Other lack of coordination  Muscle weakness (generalized)  Attention and concentration deficit  Rationale for Evaluation and Treatment Rehabilitation  SUBJECTIVE:   SUBJECTIVE STATEMENT: 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  Pt spent time in ICU, transferred to [facility], then was in IP Rehab for a few days.  Pt has not returned to work yet, she is concerned about her typing, multi-tasking, time management, need for increased time for processing, and activity tolerance without stress.  Pt accompanied by: self and family member  (mother)  PERTINENT HISTORY:  HF, HLD, seizure, ETOH use, asthma, anxiety, chronic pain, long QT syndrome.   PRECAUTIONS: Fall  WEIGHT BEARING RESTRICTIONS No  PAIN:  Are you having pain? No  FALLS: Has patient fallen in last 6 months? Yes. Number of falls 4  LIVING ENVIRONMENT: Lives with: lives with their spouse and mother is staying during the week while husband works Lives in: [employer] Stairs: Yes: Internal: full flight to upstairs, but full bath and bedroom on main floor - so no need to go up steps; and External: 1 step at back door, front door has 6 steps; uses back door primarily Has following equipment at home: Environmental consultant - 2 wheeled, Crutches, Grab bars, and shower stool for [employer]  PLOF: Independent, Independent with basic ADLs, Independent with household mobility without device, Independent with community mobility without device, and 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, multi-tasking, computer skills, writing letters of recommendations, phone calls  PATIENT GOALS to improve typing speed and accuracy, multi-tasking without increased stress  OBJECTIVE:   HAND DOMINANCE: Right  ADLs:  Overall ADLs: Mod I, mother is in the home in case of emergency during day and spouse at night  IADLs: Shopping: utilized grocery cart for energy conservation Light housekeeping: putting away laundry Meal Prep: husband does most of the cooking/grilling so he will shop with her  Community mobility: has gone out of the house for errands and out to eat, current not driving for ~ 6 months and was not driving before due to seizures  Medication management: mother is currently assisting due to new meds Financial management: pt was primary, however family has been doing since hospitalization  Handwriting: 100% legible  MOBILITY STATUS: Independent  POSTURE COMMENTS:  No Significant postural limitations  ACTIVITY TOLERANCE: Activity tolerance: WFL for  tasks assessed on eval; does report decreased endurance  UPPER EXTREMITY ROM: WFL Bilaterally   UPPER EXTREMITY MMT:     MMT Right eval Left eval  Shoulder flexion 5/5 4+/5  Shoulder abduction    Shoulder adduction    Shoulder extension    Shoulder internal rotation    Shoulder external rotation    Middle trapezius    Lower trapezius    Elbow flexion 5/5 4+/5  Elbow extension 5/5 4+/5  Wrist flexion    Wrist extension    Wrist ulnar deviation    Wrist radial deviation    Wrist pronation    Wrist supination    (Blank rows = not tested)  HAND FUNCTION: Grip strength: Right: 52 lbs; Left: 41 lbs  COORDINATION: 9 Hole Peg test: Right: 23.91 sec; Left: 24.57 sec  SENSATION: WFL  COGNITION: Overall cognitive status: Within functional limits for tasks assessed and Pt reports feeling that she needs increased time for processing especially with multi-tasking  VISION: Subjective report: "not really"  "some days it feels hazy but then it clears up" Baseline vision:  lasix surgery Visual history:  lasix surgery  TODAY'S TREATMENT:  Educated on beginning to increase engagement in IADLs with use of mother as supervision and to assist if activity tolerance and processing speed impact ability to complete.  Discussed modifications to allow for increased participation in aspects of tasks.   PATIENT EDUCATION: Education details: Educated on role and purpose of OT as well as potential interventions and goals for therapy based on initial evaluation findings. Person educated: Patient and Parent Education method: Explanation Education comprehension: verbalized understanding and needs further education   HOME EXERCISE PROGRAM: TBD    GOALS: Goals reviewed with patient? No  SHORT TERM GOALS: Target date: 01/15/22  Pt will be independent with [facility] HEP to increase coordination as needed for ADLs and vocation tasks. Baseline: Goal status: INITIAL  2.  Pt will verbalize  understanding of energy conservation strategies and be able to verbalize 2 strategies that she is incorporating into IADLs to increase activity tolerance. Baseline:  Goal status: INITIAL  3.  Pt will demonstrate improved typing speed by 5 WPM while maintaining >95% accuracy to demonstrate increased coordination as needed for RTW. Baseline: (5 minutes): 47 wpm x 97% accuracy = 47 wpm on 12/12/21 Goal status: INITIAL   LONG TERM GOALS: Target date: 02/12/22  Pt will demonstrate improved fine motor coordination for ADLs as evidenced by decreasing 9 hole peg test score for BUE by 5 secs Baseline: Right: 23.91 sec; Left: 24.57 sec Goal status: INITIAL  2.  Pt will demonstrate improved typing speed by 10 WPM while maintaining >95% accuracy to demonstrate increased coordination as needed for RTW. Baseline:  Goal status: INITIAL  3.  Pt will  complete table top scanning/dual tasking activity at Mod I level. Baseline:  Goal status: INITIAL  4.  Pt will demonstrate and/or verbalize ability to sequence moderately challenging IADL/vocation task (banking, balancing check book/leger, taking phone messages) at Mod I level demonstrating good alternating attention. Baseline:  Goal status: INITIAL  5.  Pt will verbalize understanding of RTW recommendations and recognize when able to return to work functions. Baseline:  Goal status: INITIAL   ASSESSMENT:  CLINICAL IMPRESSION: Patient is a 36 y.o. female who was seen today for occupational therapy evaluation for [facility] impairments, executive functioning, and processing impairments s/p anoxic BI after V-fib/VT cardiac arrest. Pt currently lives with husband, mother currently present during the day while husband is working in a 2 story home with bedroom and full bath on the main floor.  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 PMHx includes HF, HLD, seizure, ETOH use, asthma, anxiety, chronic pain, long QT  syndrome. Pt will benefit from skilled occupational therapy services to address strength and coordination, pain management, balance, endurance, cognition, safety awareness, introduction of compensatory strategies/AE prn, and implementation of an HEP to improve participation and safety during IADLs and vocation tasks.   PERFORMANCE DEFICITS in functional skills including IADLs, coordination, strength, FMC, balance, body mechanics, endurance, cardiopulmonary status limiting function, and UE functional use, cognitive skills including attention, energy/drive, and sequencing, and psychosocial skills including environmental adaptation and routines and behaviors.   IMPAIRMENTS are limiting patient from IADLs, rest and sleep, and work.   COMORBIDITIES may have co-morbidities  that affects occupational performance. Patient will benefit from skilled OT to address above impairments and improve overall function.  MODIFICATION OR ASSISTANCE TO COMPLETE EVALUATION: Min-Moderate modification of tasks or assist with assess necessary to complete an evaluation.  OT OCCUPATIONAL PROFILE AND HISTORY: Detailed assessment: Review of records and additional review of physical, cognitive, psychosocial history related to current functional performance.  CLINICAL DECISION MAKING: Moderate - several treatment options, min-mod task modification necessary  REHAB POTENTIAL: Good  EVALUATION COMPLEXITY: Low    PLAN: OT FREQUENCY: 1x/week  OT DURATION: 8 weeks  PLANNED INTERVENTIONS: self care/ADL training, therapeutic exercise, therapeutic activity, balance training, functional mobility training, moist heat, cryotherapy, patient/family education, cognitive remediation/compensation, psychosocial skills training, energy conservation, coping strategies training, and DME and/or AE instructions  RECOMMENDED OTHER SERVICES: N/A  CONSULTED AND AGREED WITH PLAN OF CARE: Patient and family member/caregiver  PLAN FOR NEXT  SESSION: begin typing program, complete pill box assessment, dual tasking, executive functioning tasks   Hilmar Moldovan, OTR/L 12/18/2021, 9:53 AM

## 2021-12-18 NOTE — Therapy (Signed)
OUTPATIENT PHYSICAL THERAPY NEURO EVALUATION   Patient Name: Kara [name] MRN: [id number] DOB:November 03, 1985, 36 y.o., female Today's Date: 12/18/2021   PCP: [name], MD REFERRING PROVIDER: Genice Rouge, MD   PT End of Session - 12/18/21 [phone]     Visit Number 1    Number of Visits 13    Date for PT Re-Evaluation 01/29/22    Authorization Type BC[employer]    PT Start Time 0935    PT Stop Time 1018    PT Time Calculation (min) 43 min    Equipment Utilized During Treatment Gait belt    Activity Tolerance Patient tolerated treatment well    Behavior During Therapy [facility] for tasks assessed/performed             Past Medical History:  Diagnosis Date   Alcoholism in remission (HCC)    per pt in remission since 2017   Anemia    AR (allergic rhinitis)    allergist-- dr Lucie Leather   Biliary dyskinesia    Diastolic CHF (HCC)    followd by dr Demetrius Charity. [facility] Emms   Environmental allergies    GERD (gastroesophageal reflux disease)    Headache    History of acute pancreatitis    alcoholic pancreatitis ,  2016 and 2017   History of sepsis 06/12/2016   with CAP   History of syncope    History of thrombosis 01/29/2015   splenic vein thrombosis-- treated with coumadin--- readmitted for abd. hematoma due to coumadin treated with IR embolization of the IMA branches   Hypertension    Polyneuropathy    Prolonged QT syndrome    cardiologist-- dr Eden Emms (notes in epic)   Severe persistent asthma    pulmonologist-- dr dr Belva Crome (notes in epic)   Past Surgical History:  Procedure Laterality Date   CHOLECYSTECTOMY N/A 06/21/2018   Procedure: LAPAROSCOPIC CHOLECYSTECTOMY;  Surgeon: Abigail Miyamoto, MD;  Location: [facility];  Service: General;  Laterality: N/A;   ESOPHAGOGASTRODUODENOSCOPY N/A 07/17/2014   Procedure: ESOPHAGOGASTRODUODENOSCOPY (EGD);  Surgeon: Dorena Cookey, MD;  Location: [facility];  Service: Endoscopy;  Laterality: N/A;   TYMPANOSTOMY TUBE PLACEMENT Bilateral 11 MONTHS OLD   VIDEO  BRONCHOSCOPY Bilateral 10/08/2016   Procedure: VIDEO BRONCHOSCOPY WITH FLUORO;  Surgeon: Chilton Greathouse, MD;  Location: [facility];  Service: Cardiopulmonary;  Laterality: Bilateral;   Patient Active Problem List   Diagnosis Date Noted   Hypomagnesemia 12/15/2021   Chronic diarrhea 12/15/2021   Fracture of multiple ribs 12/15/2021   Anoxic brain injury (HCC) 12/09/2021   Cardiac arrest (HCC) 11/27/2021   Seizure disorder (HCC) 11/27/2021   Chronic anoxic encephalopathy (HCC) 11/27/2021   Tracheostomy status (HCC) 11/27/2021   AKI (acute kidney injury) (HCC)    Seizure (HCC)    Altered mental status    Cardiac arrest (HCC) 11/13/2021   Acute respiratory failure (HCC)    Prolonged Q-T interval on ECG    Hypokalemia    Acute encephalopathy    Cellulitis of right lower extremity 11/04/2021   Septic prepatellar bursitis of right knee 11/04/2021   Generalized tonic-clonic seizure (HCC) 01/20/2021   Ataxia 01/20/2021   Depression 08/05/2020   Acute on chronic pancreatitis (HCC) 11/06/2019   Elevated CK    Malnutrition of moderate degree 10/23/2019   Pancreatic pseudocyst    Pancreatic cyst    Fatty liver    Other allergic rhinitis 05/03/2019   Food allergy 05/03/2019   Tobacco smoker, less than 10 cigarettes per day 05/03/2019   Asthma, severe persistent,  well-controlled 11/11/2017   Lung infiltrate    Hyponatremia 08/27/2016   Hyperglycemia 08/27/2016   Acute on chronic respiratory failure with hypoxia (HCC) 08/26/2016   HCAP (healthcare-associated pneumonia) 07/31/2016   Tobacco abuse 07/31/2016   Respiratory failure with hypoxia (HCC) 07/31/2016   CAP (community acquired pneumonia) 06/13/2016   Asthma exacerbation 06/12/2016   Alcohol abuse, in remission 06/12/2016   Sepsis (HCC) 06/12/2016   Chronic pancreatitis (HCC) 09/09/2015   Gastroesophageal reflux disease 09/09/2015   Benign essential HTN 09/09/2015   FUO (fever of unknown origin)    Abdominal hematoma  02/10/2015   Warfarin-induced coagulopathy (HCC) 02/10/2015   Hypertension 02/10/2015   Hypoalbuminemia due to protein-calorie malnutrition (HCC) 02/10/2015   Alcoholic pancreatitis 02/10/2015   Acute blood loss anemia    Intra-abdominal hematoma 02/09/2015   Pancreatitis, acute    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   Acute pancreatitis 01/29/2015   Sinus tachycardia 01/29/2015   Leukocytosis 01/29/2015   AKI (acute kidney injury) (HCC) 01/29/2015   Insomnia 10/29/2014   Polyneuropathy 09/02/2014   History of alcohol abuse 09/02/2014   Edema 09/02/2014   Neck pain 09/02/2014   Chronic lower back pain 09/02/2014   Numbness of foot 08/02/2014   Hand numbness 08/02/2014   Ataxic gait 08/02/2014   Proximal leg weakness 08/02/2014   Constipation    Abdominal pain 07/16/2014   Hypothermia 07/16/2014   Upper abdominal pain    Abdominal pain, epigastric 07/06/2014   Obesity (BMI 30-39.9) 07/06/2014   Chronic diastolic heart failure (HCC) 07/06/2014   Syncope 04/08/2014   Prolonged QT interval 04/08/2014   Hypokalemia 04/08/2014   Elevated LFTs 04/08/2014    ONSET DATE: 11/13/2021  REFERRING DIAG:  G93.1 (ICD-10-CM) - Anoxic brain damage, not elsewhere classified  G40.909 (ICD-10-CM) - Epilepsy, unspecified, not intractable, without status epilepticus    THERAPY DIAG:  Other abnormalities of gait and mobility  Unsteadiness on feet  Muscle weakness (generalized)  Rationale for Evaluation and Treatment Rehabilitation  SUBJECTIVE:                                                                                                                                                                                              SUBJECTIVE STATEMENT: Feel like I'm moving well. R side is a little weaker than the L.  Had hematoma drained from R knee prior to the cardiac event.  Mom and pt reports steps and curbs are more difficult.  Have a gravel driveway and grass surfaces have gone  "okay", always have someone with me. Pt accompanied by: self and family member (Mom)  PERTINENT HISTORY: Pt is a 36  y.o. female admitted 11/13/21 with v-fib/VT cardiac arrest, pt noted to have some posturing and possible myoclonic activity; UDS negative. EEG without seizure activity. MRI concerning for restricted diffusion in the bilateral posterior frontal, parietal, and superior occipital cortex, concerning for cytotoxic edema from hypoxic ischemic injury. ETT 7/28-8/3. PMH includes HF, HLD, seizure, ETOH use, asthma, anxiety, chronic pain, long QT syndrome.  She participated in inpatient rehab 12/09/21-12/15/21.  PAIN:  Are you having pain? No  PRECAUTIONS: Fall and Other: broken ribs from CPR  FALLS: Has patient fallen in last 6 months? Yes. 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, and Leisure: enjoys her 6 dogs  PATIENT GOALS Walking, leg strength, and balance is my main concern  OBJECTIVE:   DIAGNOSTIC FINDINGS: See above  COGNITION: Overall cognitive status: Within functional limits for tasks assessed   SENSATION: Light touch: WFL  POSTURE: rounded shoulders, forward head, and posterior pelvic tilt  LOWER EXTREMITY ROM:   WFL except ankle dorsiflexion:  see below  Active  Right Eval Left Eval  Hip flexion    Hip extension    Hip abduction    Hip adduction    Hip internal rotation    Hip external rotation    Knee flexion    Knee extension    Ankle dorsiflexion Active:  -4 from neutral; passive + 10 Active:  -4, passive +4  Ankle plantarflexion    Ankle inversion    Ankle eversion      (Blank rows = not tested)  LOWER EXTREMITY MMT:    MMT Right Eval Left Eval  Hip flexion 3+ 3+  Hip extension    Hip abduction 4 4  Hip adduction 4 4  Hip internal rotation    Hip external rotation    Knee flexion 4 4  Knee extension 4 4  Ankle dorsiflexion 3- 3-  Ankle plantarflexion    Ankle inversion    Ankle eversion    (Blank rows = not tested)   TRANSFERS: Assistive device utilized: None  Sit to stand: SBA Stand to sit: SBA  STAIRS:  Level of Assistance: SBA  Stair Negotiation Technique: Alternating Pattern  with Bilateral Rails  Number of Stairs: 3   Height of Stairs: 4'-6"  Comments: Decreased foot clearance with descending steps; leads with front part of foot(decreased heelstrike on step)  GAIT: Gait pattern: step through pattern, decreased step length- Right, decreased step length- Left, decreased ankle dorsiflexion- Right, decreased ankle dorsiflexion- Left, Right steppage, Left steppage, and narrow BOS Distance walked: 40 ft x 6 reps Assistive device utilized: None Level of assistance: SBA Comments:  Mild steppage gait on level, indoor surfaces due to decreased ankle dorsiflexion strength  FUNCTIONAL TESTs:  5 times sit to stand: 15.29 sec no UE support, wide BOS Timed up and go (TUG): 11.44 sec  M-CTSIB  Condition 1: Firm Surface, EO 30 Sec, Normal Sway  Condition 2: Firm Surface, EC 30 Sec, Mild Sway  Condition 3: Foam Surface, EO 30 Sec, Mild Sway  Condition 4: Foam Surface, EC 30 Sec, Mild Sway    SLS:  R and L:  10 seconds; Trendelenburg noted on  RLE Tandem:  30 seconds each foot position; Trendelenburg with RLE in posterior position. Gait velocity:  10.22 sec in 20 ft:  1.96 ft/sec FGA:  23/30 (Scores <22/30 indicate increased fall risk)  [facility] PT Assessment - 12/18/21 0001       Functional Gait  Assessment   Gait assessed  Yes    Gait Level Surface Walks 20 ft, slow speed, abnormal gait pattern, evidence for imbalance or deviates 10-15 in  outside of the 12 in walkway width. Requires more than 7 sec to ambulate 20 ft.   10.22   Change in Gait Speed Able to change speed, demonstrates mild gait deviations, deviates 6-10 in outside of the 12 in walkway width, or no gait deviations, unable to achieve a major change in velocity, or uses a change in velocity, or uses an assistive device.    Gait with Horizontal Head Turns Performs head turns smoothly with no change in gait. Deviates no more than 6 in outside 12 in walkway width    Gait with Vertical Head Turns Performs head turns with no change in gait. Deviates no more than 6 in outside 12 in walkway width.    Gait and Pivot Turn Pivot turns safely within 3 sec and stops quickly with no loss of balance.    Step Over Obstacle Is able to step over one shoe box (4.5 in total height) without changing gait speed. No evidence of imbalance.    Gait with Narrow Base of Support Is able to ambulate for 10 steps heel to toe with no staggering.    Gait with Eyes Closed Walks 20 ft, slow speed, abnormal gait pattern, evidence for imbalance, deviates 10-15 in outside 12 in walkway width. Requires more than 9 sec to ambulate 20 ft.   9.22   Ambulating Backwards Walks 20 ft, no assistive devices, good speed, no evidence for imbalance, normal gait   11.63   Steps Alternating feet, must use rail.    Total Score 23    FGA comment: Scores <22/30 indicate increased fall risk             TODAY'S TREATMENT:  Initiated HEP-see below   PATIENT EDUCATION: Education details: PT eval results, POC, initiated HEP Person educated: Patient and Parent Education method: Explanation, Demonstration, and Handouts Education comprehension: verbalized understanding and returned demonstration   HOME EXERCISE PROGRAM: Access Code: [email or web address] URL: [email or web address] Date: 12/18/2021 Prepared by: [facility] - Outpatient  Rehab - [facility]  Exercises - Seated Heel Slide  - 1-2 x daily - 5 x  weekly - 1 sets - 5 reps - 10 sec hold - Standing Gastroc Stretch on Step  - 1-2 x daily - 7 x weekly - 1 sets - 3 reps    GOALS: Goals reviewed with patient? Yes  SHORT TERM GOALS: Target date: 01/15/2022  Pt will be independent with HEP for improved strength, balance, gait. Baseline: Goal status: INITIAL  2.  Pt will improve 5x sit<>stand to less than or equal to 12.5 sec to demonstrate improved functional strength and transfer efficiency. Baseline: 15.29 sec with wide BOS Goal status: INITIAL  3.  Pt will verbalize understanding of fall prevention in home environment.  Baseline:  Goal status: INITIAL  LONG TERM GOALS: Target date: 01/29/2022  Pt will be independent with HEP for improved strength, balance, transfers, and gait. Baseline:  Goal status: INITIAL  2.  Pt will improve 5x sit<>stand to less than or equal to 10 sec to demonstrate improved  functional strength and transfer efficiency. Baseline: 15.29 sec Goal status: INITIAL  3.  Pt will improve FGA score to at least 26/30 to decrease fall risk. Baseline: 23/30 Goal status: INITIAL  4.  Pt will improve gait velocity to at least 2.62 ft/sec for improved gait efficiency and safety. Baseline: 1.96 ft/sec Goal status: INITIAL  ASSESSMENT:  CLINICAL IMPRESSION: Patient is a 36 y.o. female who was seen today for physical therapy evaluation and treatment following discharge from hospital for anoxic brain injury. She was hospitalized on 11/13/21 and went into inpatient rehab from 8/23-8/29/23.  She was discharged home with recommendations for OPPT.  Pt presents to OPPT with decreased lower extremity strength, decreased functional strength for transfers, decreased balance, decreased flexibility, abnormal gait pattern.  She is at increased risk of falls per 5x sit<>stand score and she demonstrates limited community ambulator status per gait velocity score.  Prior to hospitalization, she was independent and working.  She will  benefit from skilled PT to address the above goals for improved functional mobility, decreased fall risk, and return to independent PLOF.  OBJECTIVE IMPAIRMENTS Abnormal gait, decreased balance, decreased mobility, difficulty walking, decreased ROM, decreased strength, and postural dysfunction.   ACTIVITY LIMITATIONS standing, stairs, transfers, and locomotion level  PARTICIPATION LIMITATIONS: shopping, community activity, and occupation  PERSONAL FACTORS 3+ comorbidities: see above problem list and PMH  are also affecting patient's functional outcome.   REHAB POTENTIAL: Good  CLINICAL DECISION MAKING: Evolving/moderate complexity  EVALUATION COMPLEXITY: Moderate  PLAN: PT FREQUENCY: 2x/week  PT DURATION: 6 weeks plus eval  PLANNED INTERVENTIONS: Therapeutic exercises, Therapeutic activity, Neuromuscular re-education, Balance training, Gait training, Patient/Family education, Self Care, Stair training, Orthotic/Fit training, and Manual therapy  PLAN FOR NEXT SESSION: Review initial HEP; continue to add to HEP for hip and ankle strengthening; work on ankle flexibility; compliant surface balance   Ayjah Show W., PT 12/18/2021, 10:48 AM  [facility] [address], [address] [place], [place] [address] Phone # [phone] Fax # [phone]

## 2021-12-18 NOTE — Therapy (Unsigned)
OUTPATIENT SPEECH LANGUAGE PATHOLOGY EVALUATION   Patient Name: Kara [name] MRN: [id number] DOB:May 25, 1985, 36 y.o., female Today's Date: 12/21/2021  PCP: [name], MD REFERRING PROVIDER: Genice Rouge, MD    End of Session - 12/21/21 1032     Visit Number 1    Number of Visits 6    Date for SLP Re-Evaluation 01/22/22    SLP Start Time 0803    SLP Stop Time  0845    SLP Time Calculation (min) 42 min    Activity Tolerance Patient tolerated treatment well             Past Medical History:  Diagnosis Date   Alcoholism in remission (HCC)    per pt in remission since 2017   Anemia    AR (allergic rhinitis)    allergist-- dr Lucie Leather   Biliary dyskinesia    Diastolic CHF (HCC)    followd by dr Demetrius Charity. [facility]   Environmental allergies    GERD (gastroesophageal reflux disease)    Headache    History of acute pancreatitis    alcoholic pancreatitis ,  2016 and 2017   History of sepsis 06/12/2016   with CAP   History of syncope    History of thrombosis 01/29/2015   splenic vein thrombosis-- treated with coumadin--- readmitted for abd. hematoma due to coumadin treated with IR embolization of the IMA branches   Hypertension    Polyneuropathy    Prolonged QT syndrome    cardiologist-- dr Eden Emms (notes in epic)   Severe persistent asthma    pulmonologist-- dr dr Belva Crome (notes in epic)   Past Surgical History:  Procedure Laterality Date   CHOLECYSTECTOMY N/A 06/21/2018   Procedure: LAPAROSCOPIC CHOLECYSTECTOMY;  Surgeon: Abigail Miyamoto, MD;  Location: [facility];  Service: General;  Laterality: N/A;   ESOPHAGOGASTRODUODENOSCOPY N/A 07/17/2014   Procedure: ESOPHAGOGASTRODUODENOSCOPY (EGD);  Surgeon: Dorena Cookey, MD;  Location: [facility];  Service: Endoscopy;  Laterality: N/A;   TYMPANOSTOMY TUBE PLACEMENT Bilateral 11 MONTHS OLD   VIDEO BRONCHOSCOPY Bilateral 10/08/2016   Procedure: VIDEO BRONCHOSCOPY WITH FLUORO;  Surgeon: Chilton Greathouse, MD;  Location: [facility];   Service: Cardiopulmonary;  Laterality: Bilateral;   Patient Active Problem List   Diagnosis Date Noted   Hypomagnesemia 12/15/2021   Chronic diarrhea 12/15/2021   Fracture of multiple ribs 12/15/2021   Anoxic brain injury (HCC) 12/09/2021   Cardiac arrest (HCC) 11/27/2021   Seizure disorder (HCC) 11/27/2021   Chronic anoxic encephalopathy (HCC) 11/27/2021   Tracheostomy status (HCC) 11/27/2021   AKI (acute kidney injury) (HCC)    Seizure (HCC)    Altered mental status    Cardiac arrest (HCC) 11/13/2021   Acute respiratory failure (HCC)    Prolonged Q-T interval on ECG    Hypokalemia    Acute encephalopathy    Cellulitis of right lower extremity 11/04/2021   Septic prepatellar bursitis of right knee 11/04/2021   Generalized tonic-clonic seizure (HCC) 01/20/2021   Ataxia 01/20/2021   Depression 08/05/2020   Acute on chronic pancreatitis (HCC) 11/06/2019   Elevated CK    Malnutrition of moderate degree 10/23/2019   Pancreatic pseudocyst    Pancreatic cyst    Fatty liver    Other allergic rhinitis 05/03/2019   Food allergy 05/03/2019   Tobacco smoker, less than 10 cigarettes per day 05/03/2019   Asthma, severe persistent, well-controlled 11/11/2017   Lung infiltrate    Hyponatremia 08/27/2016   Hyperglycemia 08/27/2016   Acute on chronic respiratory failure with hypoxia (HCC)  08/26/2016   HCAP (healthcare-associated pneumonia) 07/31/2016   Tobacco abuse 07/31/2016   Respiratory failure with hypoxia (HCC) 07/31/2016   CAP (community acquired pneumonia) 06/13/2016   Asthma exacerbation 06/12/2016   Alcohol abuse, in remission 06/12/2016   Sepsis (HCC) 06/12/2016   Chronic pancreatitis (HCC) 09/09/2015   Gastroesophageal reflux disease 09/09/2015   Benign essential HTN 09/09/2015   FUO (fever of unknown origin)    Abdominal hematoma 02/10/2015   Warfarin-induced coagulopathy (HCC) 02/10/2015   Hypertension 02/10/2015   Hypoalbuminemia due to protein-calorie malnutrition  (HCC) 02/10/2015   Alcoholic pancreatitis 02/10/2015   Acute blood loss anemia    Intra-abdominal hematoma 02/09/2015   Pancreatitis, acute    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   Acute pancreatitis 01/29/2015   Sinus tachycardia 01/29/2015   Leukocytosis 01/29/2015   AKI (acute kidney injury) (HCC) 01/29/2015   Insomnia 10/29/2014   Polyneuropathy 09/02/2014   History of alcohol abuse 09/02/2014   Edema 09/02/2014   Neck pain 09/02/2014   Chronic lower back pain 09/02/2014   Numbness of foot 08/02/2014   Hand numbness 08/02/2014   Ataxic gait 08/02/2014   Proximal leg weakness 08/02/2014   Constipation    Abdominal pain 07/16/2014   Hypothermia 07/16/2014   Upper abdominal pain    Abdominal pain, epigastric 07/06/2014   Obesity (BMI 30-39.9) 07/06/2014   Chronic diastolic heart failure (HCC) 07/06/2014   Syncope 04/08/2014   Prolonged QT interval 04/08/2014   Hypokalemia 04/08/2014   Elevated LFTs 04/08/2014    ONSET DATE: 11/04/21   REFERRING DIAG: G93.1 (ICD-10-CM) - Anoxic brain damage, not elsewhere classified G40.909 (ICD-10-CM) - Epilepsy, unspecified, not intractable, without status epilepticus   THERAPY DIAG:  Cognitive communication deficit  Rationale for Evaluation and Treatment Rehabilitation  SUBJECTIVE:   SUBJECTIVE STATEMENT: "It's my first time here.  Pt accompanied by:  mother  PERTINENT HISTORY: Kara [name] is a 36 year old female with history of HTN, diastolic CHF, severe asthma, alcohol abuse-quit 2017, pancreatitis on creon, seizure disorder, sensory polyneuropathy, recent septic bursitis right knee 11/04/21.  On 11/13/21, admitted after found pulseless and apneic with suspected downtime of about 20-30 minutes. Cardiology felt that  cardiac arrest was secondary to be due to VT/VF in setting of QT prolongation and electrolyte abnormality due to severe hypokalemia.  EEG showed diffuse profound encephalopathy and MRI brain done revealing "restricted  diffusion in the bilateral posterior frontal,parietal, and superior occipital cortex, concerning for cytotoxic edema from hypoxic ischemic injury." She was extubated without difficulty but had issues with agitation requiring sedation. She was reintubated due to respiratory decline, required tracheostomy 08/09 by Dr. Merrily Pew with ATC trials. Cortak placed for nutritional support 7/23.  She continued to have bouts of agitation alternating with lethargy question due to withdrawal from narcotics. She tolerated vent wean and was decannulated by 08/20. Mentation has greatly improved and she is making great gains with therapy. She continues to be limited by cognitive deficits, ataxic gait, weakness and chest wall pain. CIR recommended due to functional decline. Pt was seen by ST on CIR and d/c report 12/14/21 states "Skilled ST services focused on education and cognitive skills. Pt's husband and mother were present and engaged in education. SLP facilitated reassessment if cognitive skills administering SLUMS, scoring 27/30 (n=>27) with mild deficits in recall/divergent naming. Pt also completed subsections of formal cognitive assessment CLQT, pt scored WFL on attention, memory and executive function, with memory/executive function being towards the mid-lower end of normal. All questions were answered to satisfaction. Pt  was left in room with family, call bell within reach and bed alarm set. SLP recommends to continue skilled services."  PAIN:  Are you having pain? Yes: NPRS scale: 2/10 Pain location: ribs Pain description: sore Aggravating factors: movement - shart Relieving factors: not moving   FALLS: Has patient fallen in last 6 months?  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.   PATIENT GOALS  Improve memory, wants to type again  OBJECTIVE:   DIAGNOSTIC FINDINGS: MRI without contrast from 11-18-21 revealed:  IMPRESSION: 1. Restricted diffusion in the bilateral posterior frontal, parietal, and superior occipital cortex, concerning for cytotoxic edema from hypoxic ischemic injury. 2.  No seizure etiology identified.  COGNITION: Overall cognitive status: Pt was given subtests of CLQT 6 days ago, so that assessment was inappropriate for this evaluation.  Jinny scored WNL on [employer] Learning Test (HVLT) and portions of the Self Administered Gerocognitive Evaluation (SAGE). 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. 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 Functional deficits: see above. Pt reports currently not using any additional memory strategies than she would have used prior to hospitalization.  COGNITIVE COMMUNICATION Following directions: Follows multi-step commands consistently  Auditory comprehension: WFL Verbal expression: Impaired: in light of possible executive function deficit Functional communication: Impaired: in light of executive function deficit   ORAL MOTOR EXAMINATION Overall status: WFL  STANDARDIZED ASSESSMENTS: HVLT - RECALL: 8/12 (above WNL), 8/12 (WNL), 11/12 (high WNL). RECOGNITION: 11/12 (WNL)   TODAY'S TREATMENT:  SLP discussed results, and rationale for pt's frequency of x1/week x4 weeks to allow SLP to assist with compensations. SLP discussed Constant Therapy with pt/mother via handout.   PATIENT EDUCATION: Education details: see above in "today's  treatment" Person educated: Patient and Parent Education method: Explanation and Handouts Education comprehension: verbalized understanding and needs further education   GOALS: Goals reviewed with patient? Yes  SHORT TERM GOALS: Target date:  N/A     LONG TERM GOALS: Target date: 01/22/2022  (Remove Blue Hyperlink)  Pt will successfully utilize compensations for cognitive linguistic deficits successfully in 3 sessions or between 3 sessions Baseline:  Goal status: INITIAL  2.  Pt will appropriately take "brain breaks" to manage cognitive/mental fatigue in or between 3 sessions Baseline:  Goal status: INITIAL   ASSESSMENT:  CLINICAL IMPRESSION: Patient is a 36 y.o. female who was seen today for assessment of cognitive communication skills following an anoxic episode. Today, pt's skills were assessed as WNL, however pt's higher level cognitive communication skills are suspected as WFL/WNL. Pt will be seen in therapy to address compensatory measures she will need as she returns to more of her daily life activities prior to debilitating event.  OBJECTIVE IMPAIRMENTS include attention, memory, executive functioning, expressive language, and receptive language. These impairments are limiting patient from return to work, Clinical cytogeneticist finances, household responsibilities, ADLs/IADLs, and effectively communicating at home and in community. Factors affecting potential to achieve goals  and functional outcome are  none .Marland Kitchen Patient will benefit from skilled SLP services to address above impairments and improve overall function.  REHAB POTENTIAL: Excellent  PLAN: SLP FREQUENCY: 1x/week  SLP DURATION: other: 5 weeks  PLANNED INTERVENTIONS: Environmental controls, Cueing hierachy, Cognitive reorganization, Internal/external aids, Functional tasks, SLP instruction and feedback, Compensatory strategies, and Patient/family education    [facility], CCC-SLP 12/21/2021, 10:33 AM

## 2021-12-18 NOTE — Patient Instructions (Signed)
   To hone your cognitive skills you may want to download Constant Therapy to your tablet (android or apple) or your phone (android or apple).  The icon is a black "CT" with a pink/magenta underline. It will ask you what you need help with - I would suggest indicating memory, planning, organization, and attention. The entire app is free for 14 days, after that they will ask for a $30/month subscription. You can use PARTNER15 for 15% off the subscription rate.  Some patients pay for the subscription for one month, following the two week free period, and find it helpful.  I would recommend Sudoku puzzles over crosswords. I would recommend crosswords if you were having difficulty finding words but you are not having trouble with that.  We will work on, primarily, compensations for your thinking  you find challenging.

## 2021-12-18 NOTE — Patient Instructions (Addendum)
Medication Instructions:  Your physician recommends that you continue on your current medications as directed. Please refer to the Current Medication list given to you today.  *If you need a refill on your cardiac medications before your next appointment, please call your pharmacy*   Follow-Up: At [facility], you and your health needs are our priority.  As part of our continuing mission to provide you with exceptional heart care, we have created designated Provider Care Teams.  These Care Teams include your primary Cardiologist (physician) and Advanced Practice Providers (APPs -  Physician Assistants and Nurse Practitioners) who all work together to provide you with the care you need, when you need it.  We recommend signing up for the patient portal called "MyChart".  Sign up information is provided on this After Visit Summary.  MyChart is used to connect with patients for Virtual Visits (Telemedicine).  Patients are able to view lab/test results, encounter notes, upcoming appointments, etc.  Non-urgent messages can be sent to your provider as well.   To learn more about what you can do with MyChart, go to [email or web address].    Your next appointment:   01/26/2022   The format for your next appointment:   In Person  Provider:   Jari Favre, PA-C

## 2021-12-22 NOTE — Therapy (Signed)
OUTPATIENT PHYSICAL THERAPY NEURO TREATMENT   Patient Name: Kara [name] MRN: [id number] DOB:[birth year], 36 y.o., female Today's Date: 12/23/2021   PCP: [name], MD REFERRING PROVIDER: Genice Rouge, MD   PT End of Session - 12/23/21 1658     Visit Number 2    Number of Visits 13    Date for PT Re-Evaluation 01/29/22    Authorization Type BCBS    PT Start Time 1615    PT Stop Time 1658    PT Time Calculation (min) 43 min    Equipment Utilized During Treatment Gait belt    Activity Tolerance Patient tolerated treatment well    Behavior During Therapy [facility] for tasks assessed/performed              Past Medical History:  Diagnosis Date   Alcoholism in remission (HCC)    per pt in remission since 2017   Anemia    AR (allergic rhinitis)    allergist-- dr Lucie Leather   Biliary dyskinesia    Diastolic CHF (HCC)    followd by dr Demetrius Charity. [facility]ms   Environmental allergies    GERD (gastroesophageal reflux disease)    Headache    History of acute pancreatitis    alcoholic pancreatitis ,  2016 and 2017   History of sepsis 06/12/2016   with CAP   History of syncope    History of thrombosis 01/29/2015   splenic vein thrombosis-- treated with coumadin--- readmitted for abd. hematoma due to coumadin treated with IR embolization of the IMA branches   Hypertension    Polyneuropathy    Prolonged QT syndrome    cardiologist-- dr Eden Emms (notes in epic)   Severe persistent asthma    pulmonologist-- dr dr Belva Crome (notes in epic)   Past Surgical History:  Procedure Laterality Date   CHOLECYSTECTOMY N/A 06/21/2018   Procedure: LAPAROSCOPIC CHOLECYSTECTOMY;  Surgeon: Abigail Miyamoto, MD;  Location: [facility];  Service: General;  Laterality: N/A;   ESOPHAGOGASTRODUODENOSCOPY N/A 07/17/2014   Procedure: ESOPHAGOGASTRODUODENOSCOPY (EGD);  Surgeon: Dorena Cookey, MD;  Location: [facility];  Service: Endoscopy;  Laterality: N/A;   TYMPANOSTOMY TUBE PLACEMENT Bilateral 11 MONTHS OLD   VIDEO  BRONCHOSCOPY Bilateral 10/08/2016   Procedure: VIDEO BRONCHOSCOPY WITH FLUORO;  Surgeon: Chilton Greathouse, MD;  Location: [facility];  Service: Cardiopulmonary;  Laterality: Bilateral;   Patient Active Problem List   Diagnosis Date Noted   Hypomagnesemia 12/15/2021   Chronic diarrhea 12/15/2021   Fracture of multiple ribs 12/15/2021   Anoxic brain injury (HCC) 12/09/2021   Cardiac arrest (HCC) 11/27/2021   Seizure disorder (HCC) 11/27/2021   Chronic anoxic encephalopathy (HCC) 11/27/2021   Tracheostomy status (HCC) 11/27/2021   AKI (acute kidney injury) (HCC)    Seizure (HCC)    Altered mental status    Cardiac arrest (HCC) 11/13/2021   Acute respiratory failure (HCC)    Prolonged Q-T interval on ECG    Hypokalemia    Acute encephalopathy    Cellulitis of right lower extremity 11/04/2021   Septic prepatellar bursitis of right knee 11/04/2021   Generalized tonic-clonic seizure (HCC) 01/20/2021   Ataxia 01/20/2021   Depression 08/05/2020   Acute on chronic pancreatitis (HCC) 11/06/2019   Elevated CK    Malnutrition of moderate degree 10/23/2019   Pancreatic pseudocyst    Pancreatic cyst    Fatty liver    Other allergic rhinitis 05/03/2019   Food allergy 05/03/2019   Tobacco smoker, less than 10 cigarettes per day 05/03/2019   Asthma, severe  persistent, well-controlled 11/11/2017   Lung infiltrate    Hyponatremia 08/27/2016   Hyperglycemia 08/27/2016   Acute on chronic respiratory failure with hypoxia (HCC) 08/26/2016   HCAP (healthcare-associated pneumonia) 07/31/2016   Tobacco abuse 07/31/2016   Respiratory failure with hypoxia (HCC) 07/31/2016   CAP (community acquired pneumonia) 06/13/2016   Asthma exacerbation 06/12/2016   Alcohol abuse, in remission 06/12/2016   Sepsis (HCC) 06/12/2016   Chronic pancreatitis (HCC) 09/09/2015   Gastroesophageal reflux disease 09/09/2015   Benign essential HTN 09/09/2015   FUO (fever of unknown origin)    Abdominal hematoma  02/10/2015   Warfarin-induced coagulopathy (HCC) 02/10/2015   Hypertension 02/10/2015   Hypoalbuminemia due to protein-calorie malnutrition (HCC) 02/10/2015   Alcoholic pancreatitis 02/10/2015   Acute blood loss anemia    Intra-abdominal hematoma 02/09/2015   Pancreatitis, acute    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   Acute pancreatitis 01/29/2015   Sinus tachycardia 01/29/2015   Leukocytosis 01/29/2015   AKI (acute kidney injury) (HCC) 01/29/2015   Insomnia 10/29/2014   Polyneuropathy 09/02/2014   History of alcohol abuse 09/02/2014   Edema 09/02/2014   Neck pain 09/02/2014   Chronic lower back pain 09/02/2014   Numbness of foot 08/02/2014   Hand numbness 08/02/2014   Ataxic gait 08/02/2014   Proximal leg weakness 08/02/2014   Constipation    Abdominal pain 07/16/2014   Hypothermia 07/16/2014   Upper abdominal pain    Abdominal pain, epigastric 07/06/2014   Obesity (BMI 30-39.9) 07/06/2014   Chronic diastolic heart failure (HCC) 07/06/2014   Syncope 04/08/2014   Prolonged QT interval 04/08/2014   Hypokalemia 04/08/2014   Elevated LFTs 04/08/2014    ONSET DATE: 11/13/2021  REFERRING DIAG:  G93.1 (ICD-10-CM) - Anoxic brain damage, not elsewhere classified  G40.909 (ICD-10-CM) - Epilepsy, unspecified, not intractable, without status epilepticus    THERAPY DIAG:  Other abnormalities of gait and mobility  Unsteadiness on feet  Muscle weakness (generalized)  Rationale for Evaluation and Treatment Rehabilitation  SUBJECTIVE:                                                                                                                                                                                              SUBJECTIVE STATEMENT: Has been doing the stretches. Still wobbly on curbs and has not done stairs.  Pt accompanied by: self and family member (Mom)  PERTINENT HISTORY: Pt is a 36 y.o. female admitted 11/13/21 with v-fib/VT cardiac arrest, pt noted to have some  posturing and possible myoclonic activity; UDS negative. EEG without seizure activity. MRI concerning for restricted diffusion in the bilateral posterior frontal, parietal, and superior  occipital cortex, concerning for cytotoxic edema from hypoxic ischemic injury. ETT 7/28-8/3. PMH includes HF, HLD, seizure, ETOH use, asthma, anxiety, chronic pain, long QT syndrome.  She participated in inpatient rehab 12/09/21-12/15/21.  PAIN:  Are you having pain? No  PRECAUTIONS: Fall and Other: broken ribs from CPR  PATIENT GOALS Walking, leg strength, and balance is my main concern  OBJECTIVE:     TODAY'S TREATMENT: 12/23/21 Activity Comments  seated heel slide 5x each   standing gastroc stretch on step Cues to shift hips anteriorly for increased stretch   gait train with foot up brace on R  110ft Good improvement in R heel-toe pattern and pt reports improved ankle stability   Sidestepping with yellow TB at forefoot in II bars  2x4; c/o increased difficulty to R side   Resisted march with yellow loop 2x20 Good form   heel/toe raise 10x  Difficulty performing DF on R>L LE  R ankle AAROM DF + controlled eccentric DF with use of SPC 8x Good effort and control      HOME EXERCISE PROGRAM Last updated: 12/23/21 Access Code: [email or web address] URL: [email or web address] Date: 12/23/2021 Prepared by: [facility] - Outpatient  Rehab - [facility]  Exercises - Seated Heel Slide  - 1-2 x daily - 5 x weekly - 1 sets - 5 reps - 10 sec hold - Standing Gastroc Stretch on Step  - 1-2 x daily - 7 x weekly - 1 sets - 3 reps - Supine Calf Stretch with Strap  - 1 x daily - 5 x weekly - 2 sets - 10 reps   PATIENT EDUCATION: Education details: edu and handout on foot up brace; HEP update Person educated: Patient Education method: Explanation, Demonstration, Tactile cues, Verbal cues, and Handouts Education comprehension: verbalized understanding and returned demonstration    Below measures were  taken at time of initial evaluation unless otherwise specified:   DIAGNOSTIC FINDINGS: See above  COGNITION: Overall cognitive status: Within functional limits for tasks assessed   SENSATION: Light touch: WFL  POSTURE: rounded shoulders, forward head, and posterior pelvic tilt  LOWER EXTREMITY ROM:   WFL except ankle dorsiflexion:  see below  Active  Right Eval Left Eval  Hip flexion    Hip extension    Hip abduction    Hip adduction    Hip internal rotation    Hip external rotation    Knee flexion    Knee extension    Ankle dorsiflexion Active:  -4 from neutral; passive + 10 Active:  -4, passive +4  Ankle plantarflexion    Ankle inversion    Ankle eversion     (Blank rows = not tested)  LOWER EXTREMITY MMT:    MMT Right Eval Left Eval  Hip flexion 3+ 3+  Hip extension    Hip abduction 4 4  Hip adduction 4 4  Hip internal rotation    Hip external rotation    Knee flexion 4 4  Knee extension 4 4  Ankle dorsiflexion 3- 3-  Ankle plantarflexion    Ankle inversion    Ankle eversion    (Blank rows = not tested)   TRANSFERS: Assistive device utilized: None  Sit to stand: SBA Stand to sit: SBA  STAIRS:  Level of Assistance: SBA  Stair Negotiation Technique: Alternating Pattern  with Bilateral Rails  Number of Stairs: 3   Height of Stairs: 4'-6"  Comments: Decreased foot clearance with descending steps; leads with front part of foot(decreased heelstrike on step)  GAIT:  Gait pattern: step through pattern, decreased step length- Right, decreased step length- Left, decreased ankle dorsiflexion- Right, decreased ankle dorsiflexion- Left, Right steppage, Left steppage, and narrow BOS Distance walked: 40 ft x 6 reps Assistive device utilized: None Level of assistance: SBA Comments:  Mild steppage gait on level, indoor surfaces due to decreased ankle dorsiflexion strength  FUNCTIONAL TESTs:  5 times sit to stand: 15.29 sec no UE support, wide BOS Timed up and  go (TUG): 11.44 sec  M-CTSIB  Condition 1: Firm Surface, EO 30 Sec, Normal Sway  Condition 2: Firm Surface, EC 30 Sec, Mild Sway  Condition 3: Foam Surface, EO 30 Sec, Mild Sway  Condition 4: Foam Surface, EC 30 Sec, Mild Sway    SLS:  R and L:  10 seconds; Trendelenburg noted on RLE Tandem:  30 seconds each foot position; Trendelenburg with RLE in posterior position. Gait velocity:  10.22 sec in 20 ft:  1.96 ft/sec FGA:  23/30 (Scores <22/30 indicate increased fall risk)    TODAY'S TREATMENT:  Initiated HEP-see below   PATIENT EDUCATION: Education details: PT eval results, POC, initiated HEP Person educated: Patient and Parent Education method: Explanation, Demonstration, and Handouts Education comprehension: verbalized understanding and returned demonstration   HOME EXERCISE PROGRAM: Access Code: [email or web address] URL: [email or web address] Date: 12/18/2021 Prepared by: [facility] - Outpatient  Rehab - [facility]  Exercises - Seated Heel Slide  - 1-2 x daily - 5 x weekly - 1 sets - 5 reps - 10 sec hold - Standing Gastroc Stretch on Step  - 1-2 x daily - 7 x weekly - 1 sets - 3 reps    GOALS: Goals reviewed with patient? Yes  SHORT TERM GOALS: Target date: 01/15/2022  Pt will be independent with HEP for improved strength, balance, gait. Baseline: Goal status: IN PROGRESS  2.  Pt will improve 5x sit<>stand to less than or equal to 12.5 sec to demonstrate improved functional strength and transfer efficiency. Baseline: 15.29 sec with wide BOS Goal status: IN PROGRESS  3.  Pt will verbalize understanding of fall prevention in home environment.  Baseline:  Goal status: IN PROGRESS  LONG TERM GOALS: Target date: 01/29/2022  Pt will be independent with HEP for improved strength, balance, transfers, and gait. Baseline:  Goal status: IN PROGRESS  2.  Pt will improve 5x sit<>stand to less than or equal to 10 sec to demonstrate improved functional strength  and transfer efficiency. Baseline: 15.29 sec Goal status: IN PROGRESS  3.  Pt will improve FGA score to at least 26/30 to decrease fall risk. Baseline: 23/30 Goal status: IN PROGRESS  4.  Pt will improve gait velocity to at least 2.62 ft/sec for improved gait efficiency and safety. Baseline: 1.96 ft/sec Goal status: IN PROGRESS  ASSESSMENT:  CLINICAL IMPRESSION:  Patient arrived to session without complaints. Initiated use of foot up brace to assist with normalizing gait pattern. Patient did demonstrate improved heel-toe pattern with use of brace. Patient demonstrated good effort with resisted hip strengthening activities but reported increased R>L LE fatigue. Difficulty with active ankle dorsiflexion evident in standing, thus worked on sitting AAROM with improved success. Patient reported understanding of all edu provided and without complaints at end of session.    OBJECTIVE IMPAIRMENTS Abnormal gait, decreased balance, decreased mobility, difficulty walking, decreased ROM, decreased strength, and postural dysfunction.   ACTIVITY LIMITATIONS standing, stairs, transfers, and locomotion level  PARTICIPATION LIMITATIONS: shopping, community activity, and occupation  PERSONAL FACTORS 3+ comorbidities: see above problem list  and PMH  are also affecting patient's functional outcome.   REHAB POTENTIAL: Good  CLINICAL DECISION MAKING: Evolving/moderate complexity  EVALUATION COMPLEXITY: Moderate  PLAN: PT FREQUENCY: 2x/week  PT DURATION: 6 weeks plus eval  PLANNED INTERVENTIONS: Therapeutic exercises, Therapeutic activity, Neuromuscular re-education, Balance training, Gait training, Patient/Family education, Self Care, Stair training, Orthotic/Fit training, and Manual therapy  PLAN FOR NEXT SESSION: continue to add to HEP for hip and ankle strengthening; work on ankle flexibility; compliant surface balance    Anette GuarneriYevgeniya Nkosi Cortright, PT, DPT 12/23/21 5:02 PM  [place]  [facility] [address], [address] [place], [place] [address] Phone # [phone] Fax # [phone]

## 2021-12-23 ENCOUNTER — Ambulatory Visit: Payer: BC Managed Care – PPO | Admitting: Physical Therapy

## 2021-12-23 ENCOUNTER — Encounter: Payer: Self-pay | Admitting: Physical Therapy

## 2021-12-23 ENCOUNTER — Ambulatory Visit: Payer: BC Managed Care – PPO

## 2021-12-23 DIAGNOSIS — R41841 Cognitive communication deficit: Secondary | ICD-10-CM

## 2021-12-23 DIAGNOSIS — R2689 Other abnormalities of gait and mobility: Secondary | ICD-10-CM

## 2021-12-23 DIAGNOSIS — R278 Other lack of coordination: Secondary | ICD-10-CM | POA: Diagnosis not present

## 2021-12-23 DIAGNOSIS — R2681 Unsteadiness on feet: Secondary | ICD-10-CM

## 2021-12-23 DIAGNOSIS — M6281 Muscle weakness (generalized): Secondary | ICD-10-CM

## 2021-12-23 NOTE — Therapy (Addendum)
OUTPATIENT SPEECH LANGUAGE PATHOLOGY TREATMENT NOTE   Patient Name: Kara [name] MRN: [id number] DOB:04-07-1986, 36 y.o., female Today's Date: 12/23/2021  PCP: [name], MD REFERRING PROVIDER: Genice Rouge, MD    End of Session - 12/23/21 1622     Visit Number 2    Number of Visits 6    Date for SLP Re-Evaluation 01/22/22    SLP Start Time 1532    SLP Stop Time  1604    SLP Time Calculation (min) 32 min    Activity Tolerance Patient tolerated treatment well              Past Medical History:  Diagnosis Date   Alcoholism in remission (HCC)    per pt in remission since 2017   Anemia    AR (allergic rhinitis)    allergist-- dr Lucie Leather   Biliary dyskinesia    Diastolic CHF (HCC)    followd by dr Demetrius Charity. [facility]   Environmental allergies    GERD (gastroesophageal reflux disease)    Headache    History of acute pancreatitis    alcoholic pancreatitis ,  2016 and 2017   History of sepsis 06/12/2016   with CAP   History of syncope    History of thrombosis 01/29/2015   splenic vein thrombosis-- treated with coumadin--- readmitted for abd. hematoma due to coumadin treated with IR embolization of the IMA branches   Hypertension    Polyneuropathy    Prolonged QT syndrome    cardiologist-- dr Eden Emms (notes in epic)   Severe persistent asthma    pulmonologist-- dr dr Belva Crome (notes in epic)   Past Surgical History:  Procedure Laterality Date   CHOLECYSTECTOMY N/A 06/21/2018   Procedure: LAPAROSCOPIC CHOLECYSTECTOMY;  Surgeon: Abigail Miyamoto, MD;  Location: [facility];  Service: General;  Laterality: N/A;   ESOPHAGOGASTRODUODENOSCOPY N/A 07/17/2014   Procedure: ESOPHAGOGASTRODUODENOSCOPY (EGD);  Surgeon: Dorena Cookey, MD;  Location: [facility];  Service: Endoscopy;  Laterality: N/A;   TYMPANOSTOMY TUBE PLACEMENT Bilateral 11 MONTHS OLD   VIDEO BRONCHOSCOPY Bilateral 10/08/2016   Procedure: VIDEO BRONCHOSCOPY WITH FLUORO;  Surgeon: Chilton Greathouse, MD;  Location: [facility];  Service: Cardiopulmonary;  Laterality: Bilateral;   Patient Active Problem List   Diagnosis Date Noted   Hypomagnesemia 12/15/2021   Chronic diarrhea 12/15/2021   Fracture of multiple ribs 12/15/2021   Anoxic brain injury (HCC) 12/09/2021   Cardiac arrest (HCC) 11/27/2021   Seizure disorder (HCC) 11/27/2021   Chronic anoxic encephalopathy (HCC) 11/27/2021   Tracheostomy status (HCC) 11/27/2021   AKI (acute kidney injury) (HCC)    Seizure (HCC)    Altered mental status    Cardiac arrest (HCC) 11/13/2021   Acute respiratory failure (HCC)    Prolonged Q-T interval on ECG    Hypokalemia    Acute encephalopathy    Cellulitis of right lower extremity 11/04/2021   Septic prepatellar bursitis of right knee 11/04/2021   Generalized tonic-clonic seizure (HCC) 01/20/2021   Ataxia 01/20/2021   Depression 08/05/2020   Acute on chronic pancreatitis (HCC) 11/06/2019   Elevated CK    Malnutrition of moderate degree 10/23/2019   Pancreatic pseudocyst    Pancreatic cyst    Fatty liver    Other allergic rhinitis 05/03/2019   Food allergy 05/03/2019   Tobacco smoker, less than 10 cigarettes per day 05/03/2019   Asthma, severe persistent, well-controlled 11/11/2017   Lung infiltrate    Hyponatremia 08/27/2016   Hyperglycemia 08/27/2016   Acute on chronic respiratory failure with  hypoxia (HCC) 08/26/2016   HCAP (healthcare-associated pneumonia) 07/31/2016   Tobacco abuse 07/31/2016   Respiratory failure with hypoxia (HCC) 07/31/2016   CAP (community acquired pneumonia) 06/13/2016   Asthma exacerbation 06/12/2016   Alcohol abuse, in remission 06/12/2016   Sepsis (HCC) 06/12/2016   Chronic pancreatitis (HCC) 09/09/2015   Gastroesophageal reflux disease 09/09/2015   Benign essential HTN 09/09/2015   FUO (fever of unknown origin)    Abdominal hematoma 02/10/2015   Warfarin-induced coagulopathy (HCC) 02/10/2015   Hypertension 02/10/2015   Hypoalbuminemia due to protein-calorie  malnutrition (HCC) 02/10/2015   Alcoholic pancreatitis 02/10/2015   Acute blood loss anemia    Intra-abdominal hematoma 02/09/2015   Pancreatitis, acute    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   Acute pancreatitis 01/29/2015   Sinus tachycardia 01/29/2015   Leukocytosis 01/29/2015   AKI (acute kidney injury) (HCC) 01/29/2015   Insomnia 10/29/2014   Polyneuropathy 09/02/2014   History of alcohol abuse 09/02/2014   Edema 09/02/2014   Neck pain 09/02/2014   Chronic lower back pain 09/02/2014   Numbness of foot 08/02/2014   Hand numbness 08/02/2014   Ataxic gait 08/02/2014   Proximal leg weakness 08/02/2014   Constipation    Abdominal pain 07/16/2014   Hypothermia 07/16/2014   Upper abdominal pain    Abdominal pain, epigastric 07/06/2014   Obesity (BMI 30-39.9) 07/06/2014   Chronic diastolic heart failure (HCC) 07/06/2014   Syncope 04/08/2014   Prolonged QT interval 04/08/2014   Hypokalemia 04/08/2014   Elevated LFTs 04/08/2014    ONSET DATE: 11/04/21   REFERRING DIAG: G93.1 (ICD-10-CM) - Anoxic brain damage, not elsewhere classified G40.909 (ICD-10-CM) - Epilepsy, unspecified, not intractable, without status epilepticus   THERAPY DIAG:  Cognitive communication deficit  Rationale for Evaluation and Treatment Rehabilitation  SUBJECTIVE:   SUBJECTIVE STATEMENT: "Things have went pretty well since the last time."  She took the wrong set of meds once, due to dim lighting (blue side and purple side of medbox) Pt accompanied by: self  PERTINENT HISTORY: Kara [name] is a 36 year old female with history of HTN, diastolic CHF, severe asthma, alcohol abuse-quit 2017, pancreatitis on creon, seizure disorder, sensory polyneuropathy, recent septic bursitis right knee 11/04/21.  On 11/13/21, admitted after found pulseless and apneic with suspected downtime of about 20-30 minutes. Cardiology felt that  cardiac arrest was secondary to be due to VT/VF in setting of QT prolongation and  electrolyte abnormality due to severe hypokalemia.  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 She was reintubated due to respiratory decline, required tracheostomy 08/09 by Dr. Merrily Pew with ATC trials. Cortak placed for nutritional support 7/23.  She continued to have bouts of agitation alternating with lethargy question due to withdrawal from narcotics. She tolerated vent wean and was decannulated by 08/20. Mentation has greatly improved and she is making great gains with therapy. She continues to be limited by cognitive deficits, ataxic gait, weakness and chest wall pain. CIR recommended due to functional decline. Pt was seen by ST on CIR and d/c report 12/14/21 states "Skilled ST services focused on education and cognitive skills. Pt's husband and mother were present and engaged in education. SLP facilitated reassessment if cognitive skills administering SLUMS, scoring 27/30 (n=>27) with mild deficits in recall/divergent naming. Pt also completed subsections of formal cognitive assessment CLQT, pt scored  WFL on attention, memory and executive function, with memory/executive function being towards the mid-lower end of normal. All questions were answered to satisfaction. Pt was left in room with family, call bell within reach and bed alarm set. SLP recommends to continue skilled services."  PAIN:  Are you having pain? No   PATIENT GOALS  Improve memory, wants to type again  OBJECTIVE:   DIAGNOSTIC FINDINGS: MRI without contrast from 11-18-21 revealed:  IMPRESSION: 1. Restricted diffusion in the bilateral posterior frontal, parietal, and superior occipital cortex, concerning for cytotoxic edema from hypoxic ischemic injury. 2.  No  seizure etiology identified.   TODAY'S TREATMENT:  12/23/21: Pt reported she and husband worked through his books over the weekend and pt found herself getting faster at [employer] as the time progressed. By the end of the time Alayjah did not require any assistance from her husband. She rec'd notifications that people got their payments as directed - no errors. Husband is managing his own schedule at this time due to the incr'd medical appointments on pt's schedule. Pt is thinking of doing this permanently as he now has more autonomy with scheduling.  Pt went through the mail when she was in the hospital - paid unpaid bills. There is a stack that needs filing and she will do that before next session.  Medication - pt took PM meds in AM on Monday due to so early in morning, pt did not differentiate dark blue and dark purple shading on med box. She is going to use a paint pen to differentiate AM from PM more distinctly. She will do this before next session.  Since Friday 12-18-21 pt is putting all appointments into a calendar (paper) which she is using to track daily appointments. No errors to date.   12/18/21: SLP discussed results, and rationale for pt's frequency of x1/week x4 weeks to allow SLP to assist with compensations. SLP discussed Constant Therapy with pt/mother via handout.   PATIENT EDUCATION: Education details: see above in "today's treatment" Person educated: Patient and Parent Education method: Explanation and Handouts Education comprehension: verbalized understanding and needs further education   GOALS: Goals reviewed with patient? Yes  SHORT TERM GOALS: Target date:  N/A     LONG TERM GOALS: Target date: 01/22/2022    Pt will successfully utilize compensations for cognitive linguistic deficits in 3 sessions or between 3 sessions Baseline: 12-23-21 Goal status: Ongoing  2.  Pt will appropriately take "brain breaks" to manage cognitive/mental fatigue in or between 3  sessions Baseline: 12-23-21 Goal status: Ongoing   ASSESSMENT:  CLINICAL IMPRESSION: Patient is a 36 y.o. female who was seen today for treatment focusing on compensations for cognitive communication skills. These skills were assessed as WNL, however pt's higher level cognitive communication skills are suspected as WFL/WNL. Kanyia is beginning to use successful compensatory measures around the home, and has taken brain breaks accordingly (sorting mail that arrived when she was in hospital). She agreed that if everything remains good next session we may decr frequency to once every two weeks.  OBJECTIVE IMPAIRMENTS include attention, memory, executive functioning, expressive language, and receptive language. These impairments are limiting patient from return to work, Clinical cytogeneticist finances, household responsibilities, ADLs/IADLs, and effectively communicating at home and in community. Factors affecting potential to achieve goals and functional outcome are  none .Marland Kitchen Patient will benefit from skilled SLP services to address above impairments and improve overall function.  REHAB POTENTIAL: Excellent  PLAN: SLP FREQUENCY: 1x/week  SLP DURATION: other: 5  weeks  PLANNED INTERVENTIONS: Environmental controls, Cueing hierachy, Cognitive reorganization, Internal/external aids, Functional tasks, SLP instruction and feedback, Compensatory strategies, and Patient/family education    [facility], CCC-SLP 12/23/2021, 4:22 PM

## 2021-12-23 NOTE — Therapy (Signed)
OUTPATIENT PHYSICAL THERAPY NEURO TREATMENT   Patient Name: Kara [name] MRN: [id number] DOB:[birth year], 36 y.o., female Today's Date: 12/24/2021   PCP: Lawerance Cruel, MD REFERRING PROVIDER: Courtney Heys, MD   PT End of Session - 12/24/21 [phone]     Visit Number 3    Number of Visits 13    Date for PT Re-Evaluation 01/29/22    Authorization Type BCBS    PT Start Time 0848    PT Stop Time 0926    PT Time Calculation (min) 38 min    Equipment Utilized During Treatment Gait belt    Activity Tolerance Patient tolerated treatment well    Behavior During Therapy [facility] for tasks assessed/performed              Past Medical History:  Diagnosis Date   Alcoholism in remission ([place])    per pt in remission since 2017   Anemia    AR (allergic rhinitis)    allergist-- dr Neldon Mc   Biliary dyskinesia    Diastolic CHF ([place])    followd by dr Mamie Nick. Johnsie Cancel   Environmental allergies    GERD (gastroesophageal reflux disease)    Headache    History of acute pancreatitis    alcoholic pancreatitis ,  4536 and 2017   History of sepsis 06/12/2016   with CAP   History of syncope    History of thrombosis 01/29/2015   splenic vein thrombosis-- treated with coumadin--- readmitted for abd. hematoma due to coumadin treated with IR embolization of the IMA branches   Hypertension    Polyneuropathy    Prolonged QT syndrome    cardiologist-- dr Johnsie Cancel (notes in epic)   Severe persistent asthma    pulmonologist-- dr dr Bernita Raisin (notes in epic)   Past Surgical History:  Procedure Laterality Date   CHOLECYSTECTOMY N/A 06/21/2018   Procedure: LAPAROSCOPIC CHOLECYSTECTOMY;  Surgeon: Coralie Keens, MD;  Location: [facility];  Service: General;  Laterality: N/A;   ESOPHAGOGASTRODUODENOSCOPY N/A 07/17/2014   Procedure: ESOPHAGOGASTRODUODENOSCOPY (EGD);  Surgeon: Teena Irani, MD;  Location: [facility];  Service: Endoscopy;  Laterality: N/A;   TYMPANOSTOMY TUBE PLACEMENT Bilateral 11 MONTHS OLD   VIDEO  BRONCHOSCOPY Bilateral 10/08/2016   Procedure: VIDEO BRONCHOSCOPY WITH FLUORO;  Surgeon: Marshell Garfinkel, MD;  Location: [facility];  Service: Cardiopulmonary;  Laterality: Bilateral;   Patient Active Problem List   Diagnosis Date Noted   Hypomagnesemia 12/15/2021   Chronic diarrhea 12/15/2021   Fracture of multiple ribs 12/15/2021   Anoxic brain injury ([place]) 12/09/2021   Cardiac arrest ([place]) 11/27/2021   Seizure disorder ([place]) 11/27/2021   Chronic anoxic encephalopathy ([place]) 11/27/2021   Tracheostomy status ([place]) 11/27/2021   AKI (acute kidney injury) ([place])    Seizure ([place])    Altered mental status    Cardiac arrest ([place]) 11/13/2021   Acute respiratory failure ([facility])    Prolonged Q-T interval on ECG    Hypokalemia    Acute encephalopathy    Cellulitis of right lower extremity 11/04/2021   Septic prepatellar bursitis of right knee 11/04/2021   Generalized tonic-clonic seizure ([place]) 01/20/2021   Ataxia 01/20/2021   Depression 08/05/2020   Acute on chronic pancreatitis ([place]) 11/06/2019   Elevated CK    Malnutrition of moderate degree 10/23/2019   Pancreatic pseudocyst    Pancreatic cyst    Fatty liver    Other allergic rhinitis 05/03/2019   Food allergy 05/03/2019   Tobacco smoker, less than 10 cigarettes per day 05/03/2019   Asthma, severe  persistent, well-controlled 11/11/2017   Lung infiltrate    Hyponatremia 08/27/2016   Hyperglycemia 08/27/2016   Acute on chronic respiratory failure with hypoxia ([place]) 08/26/2016   HCAP (healthcare-associated pneumonia) 07/31/2016   Tobacco abuse 07/31/2016   Respiratory failure with hypoxia ([place]) 07/31/2016   CAP (community acquired pneumonia) 06/13/2016   Asthma exacerbation 06/12/2016   Alcohol abuse, in remission 06/12/2016   Sepsis ([place]) 06/12/2016   Chronic pancreatitis ([place]) 09/09/2015   Gastroesophageal reflux disease 09/09/2015   Benign essential HTN 09/09/2015   FUO (fever of unknown origin)    Abdominal hematoma  02/10/2015   Warfarin-induced coagulopathy ([place]) 02/10/2015   Hypertension 02/10/2015   Hypoalbuminemia due to protein-calorie malnutrition ([place]) 93/73/4287   Alcoholic pancreatitis 68/02/5725   Acute blood loss anemia    Intra-abdominal hematoma 02/09/2015   Pancreatitis, acute    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   Acute pancreatitis 01/29/2015   Sinus tachycardia 01/29/2015   Leukocytosis 01/29/2015   AKI (acute kidney injury) ([place]) 01/29/2015   Insomnia 10/29/2014   Polyneuropathy 09/02/2014   History of alcohol abuse 09/02/2014   Edema 09/02/2014   Neck pain 09/02/2014   Chronic lower back pain 09/02/2014   Numbness of foot 08/02/2014   Hand numbness 08/02/2014   Ataxic gait 08/02/2014   Proximal leg weakness 08/02/2014   Constipation    Abdominal pain 07/16/2014   Hypothermia 07/16/2014   Upper abdominal pain    Abdominal pain, epigastric 07/06/2014   Obesity (BMI 30-39.9) 07/06/2014   Chronic diastolic heart failure ([place]) 07/06/2014   Syncope 04/08/2014   Prolonged QT interval 04/08/2014   Hypokalemia 04/08/2014   Elevated LFTs 04/08/2014    ONSET DATE: 11/13/2021  REFERRING DIAG:  G93.1 (ICD-10-CM) - Anoxic brain damage, not elsewhere classified  G40.909 (ICD-10-CM) - Epilepsy, unspecified, not intractable, without status epilepticus    THERAPY DIAG:  Muscle weakness (generalized)  Unsteadiness on feet  Other abnormalities of gait and mobility  Rationale for Evaluation and Treatment Rehabilitation  SUBJECTIVE:                                                                                                                                                                                              SUBJECTIVE STATEMENT: Had a little bit of LBP yesterday since the last workout.  Pt accompanied by: self and family member (Mom)  PERTINENT HISTORY: Pt is a 36 y.o. female admitted 11/13/21 with v-fib/VT cardiac arrest, pt noted to have some posturing and  possible myoclonic activity; UDS negative. EEG without seizure activity. MRI concerning for restricted diffusion in the bilateral posterior frontal, parietal, and superior occipital cortex, concerning  for cytotoxic edema from hypoxic ischemic injury. ETT 7/28-8/3. PMH includes HF, HLD, seizure, ETOH use, asthma, anxiety, chronic pain, long QT syndrome.  She participated in inpatient rehab 12/09/21-12/15/21.  PAIN:  Are you having pain? Yes: NPRS scale: 2/10 Pain location: LB Pain description: sore Aggravating factors: working out Relieving factors: rest  PRECAUTIONS: Fall and Other: broken ribs from CPR  PATIENT GOALS Walking, leg strength, and balance is my main concern  OBJECTIVE:     TODAY'S TREATMENT: 12/721 Activity Comments  Nustep L5 x 6 min Ues/LEs Maintaining ~60 SPM  sitting B toe raise 10x Limited R>L  AAROM R ankle DF with strap 10x  Good control  R gastroc stretch with strap 2x30"    L/R step up/back on 6" step 5x each 1 UE support   L/R step up/back on 2" step 5x each No UE support  L/R step up/back on 6" step 5x each Good stability, limited eccentric control B  SLS 30" each More ankle instability on R  Ant/pos monster walk with yellow TB 2x65f CGA d/t fear of falling     HOME EXERCISE PROGRAM Last updated: 12/24/21 Access Code: [email or web address] URL: [email or web address] Date: 12/24/2021 [facility]: [facility]  Exercises - Seated Heel Slide  - 1-2 x daily - 5 x weekly - 1 sets - 5 reps - 10 sec hold - Standing Gastroc Stretch on Step  - 1-2 x daily - 7 x weekly - 1 sets - 3 reps - Supine Calf Stretch with Strap  - 1 x daily - 5 x weekly - 2 sets - 10 reps - Seated Ankle Dorsiflexion AROM  - 1 x daily - 5 x weekly - 2 sets - 10 reps - Standing Single Leg Stance with Counter Support  - 1 x daily - 5 x weekly - 2 sets - 30 sec hold - Forward Step Up  - 1 x daily - 5 x weekly - 2 sets - 10 reps   PATIENT  EDUCATION: Education details: HEP update Person educated: Patient Education method: Explanation, Demonstration, Tactile cues, Verbal cues, and Handouts Education comprehension: verbalized understanding and returned demonstration     Below measures were taken at time of initial evaluation unless otherwise specified:   DIAGNOSTIC FINDINGS: See above  COGNITION: Overall cognitive status: Within functional limits for tasks assessed   SENSATION: Light touch: WFL  POSTURE: rounded shoulders, forward head, and posterior pelvic tilt  LOWER EXTREMITY ROM:   WFL except ankle dorsiflexion:  see below  Active  Right Eval Left Eval  Hip flexion    Hip extension    Hip abduction    Hip adduction    Hip internal rotation    Hip external rotation    Knee flexion    Knee extension    Ankle dorsiflexion Active:  -4 from neutral; passive + 10 Active:  -4, passive +4  Ankle plantarflexion    Ankle inversion    Ankle eversion     (Blank rows = not tested)  LOWER EXTREMITY MMT:    MMT Right Eval Left Eval  Hip flexion 3+ 3+  Hip extension    Hip abduction 4 4  Hip adduction 4 4  Hip internal rotation    Hip external rotation    Knee flexion 4 4  Knee extension 4 4  Ankle dorsiflexion 3- 3-  Ankle plantarflexion    Ankle inversion    Ankle eversion    (Blank rows =  not tested)   TRANSFERS: Assistive device utilized: None  Sit to stand: SBA Stand to sit: SBA  STAIRS:  Level of Assistance: SBA  Stair Negotiation Technique: Alternating Pattern  with Bilateral Rails  Number of Stairs: 3   Height of Stairs: 4'-6"  Comments: Decreased foot clearance with descending steps; leads with front part of foot(decreased heelstrike on step)  GAIT: Gait pattern: step through pattern, decreased step length- Right, decreased step length- Left, decreased ankle dorsiflexion- Right, decreased ankle dorsiflexion- Left, Right steppage, Left steppage, and narrow BOS Distance walked: 40 ft  x 6 reps Assistive device utilized: None Level of assistance: SBA Comments:  Mild steppage gait on level, indoor surfaces due to decreased ankle dorsiflexion strength  FUNCTIONAL TESTs:  5 times sit to stand: 15.29 sec no UE support, wide BOS Timed up and go (TUG): 11.44 sec  M-CTSIB  Condition 1: Firm Surface, EO 30 Sec, Normal Sway  Condition 2: Firm Surface, EC 30 Sec, Mild Sway  Condition 3: Foam Surface, EO 30 Sec, Mild Sway  Condition 4: Foam Surface, EC 30 Sec, Mild Sway    SLS:  R and L:  10 seconds; Trendelenburg noted on RLE Tandem:  30 seconds each foot position; Trendelenburg with RLE in posterior position. Gait velocity:  10.22 sec in 20 ft:  1.96 ft/sec FGA:  23/30 (Scores <22/30 indicate increased fall risk)    TODAY'S TREATMENT:  Initiated HEP-see below   PATIENT EDUCATION: Education details: PT eval results, POC, initiated HEP Person educated: Patient and Parent Education method: Explanation, Demonstration, and Handouts Education comprehension: verbalized understanding and returned demonstration   HOME EXERCISE PROGRAM: Access Code: [email or web address] URL: [email or web address] Date: 12/18/2021 Prepared by: [facility]  Exercises - Seated Heel Slide  - 1-2 x daily - 5 x weekly - 1 sets - 5 reps - 10 sec hold - Standing Gastroc Stretch on Step  - 1-2 x daily - 7 x weekly - 1 sets - 3 reps    GOALS: Goals reviewed with patient? Yes  SHORT TERM GOALS: Target date: 01/15/2022  Pt will be independent with HEP for improved strength, balance, gait. Baseline: Goal status: MET  2.  Pt will improve 5x sit<>stand to less than or equal to 12.5 sec to demonstrate improved functional strength and transfer efficiency. Baseline: 15.29 sec with wide BOS Goal status: IN PROGRESS  3.  Pt will verbalize understanding of fall prevention in home environment.  Baseline:  Goal status: IN PROGRESS  LONG TERM GOALS: Target  date: 01/29/2022  Pt will be independent with HEP for improved strength, balance, transfers, and gait. Baseline:  Goal status: IN PROGRESS  2.  Pt will improve 5x sit<>stand to less than or equal to 10 sec to demonstrate improved functional strength and transfer efficiency. Baseline: 15.29 sec Goal status: IN PROGRESS  3.  Pt will improve FGA score to at least 26/30 to decrease fall risk. Baseline: 23/30 Goal status: IN PROGRESS  4.  Pt will improve gait velocity to at least 2.62 ft/sec for improved gait efficiency and safety. Baseline: 1.96 ft/sec Goal status: IN PROGRESS  ASSESSMENT:  CLINICAL IMPRESSION: Patient arrived to session with report of some soreness in her LB after last session which is mild currently. Worked on ankle dorsiflexion strengthening with good care and effort on patient's part. Worked on step ups while weaning UE support to simulate home environment. Patient able to perform with good stability but with limited eccentric control B  d/t weakness. Patient reported some hesitation with backwards stepping hip strengthening; required CGA for improved confidence. Patient reported understanding of all edu provided and without complaints at end of session.    OBJECTIVE IMPAIRMENTS Abnormal gait, decreased balance, decreased mobility, difficulty walking, decreased ROM, decreased strength, and postural dysfunction.   ACTIVITY LIMITATIONS standing, stairs, transfers, and locomotion level  PARTICIPATION LIMITATIONS: shopping, community activity, and occupation  PERSONAL FACTORS 3+ comorbidities: see above problem list and PMH  are also affecting patient's functional outcome.   REHAB POTENTIAL: Good  CLINICAL DECISION MAKING: Evolving/moderate complexity  EVALUATION COMPLEXITY: Moderate  PLAN: PT FREQUENCY: 2x/week  PT DURATION: 6 weeks plus eval  PLANNED INTERVENTIONS: Therapeutic exercises, Therapeutic activity, Neuromuscular re-education, Balance training, Gait  training, Patient/Family education, Self Care, Stair training, Orthotic/Fit training, and Manual therapy  PLAN FOR NEXT SESSION: stairs and curbs; continue to add to HEP for hip and ankle strengthening; work on ankle flexibility; compliant surface balance   Janene Harvey, PT, DPT 12/24/21 9:29 AM  [facility] at [facility] [address], [place], [place] [address] Phone # [phone] Fax # [phone]

## 2021-12-24 ENCOUNTER — Encounter: Payer: Self-pay | Admitting: Physical Therapy

## 2021-12-24 ENCOUNTER — Ambulatory Visit: Payer: BC Managed Care – PPO | Admitting: Physical Therapy

## 2021-12-24 ENCOUNTER — Ambulatory Visit: Payer: BC Managed Care – PPO | Admitting: Occupational Therapy

## 2021-12-24 DIAGNOSIS — R2681 Unsteadiness on feet: Secondary | ICD-10-CM

## 2021-12-24 DIAGNOSIS — M6281 Muscle weakness (generalized): Secondary | ICD-10-CM

## 2021-12-24 DIAGNOSIS — R2689 Other abnormalities of gait and mobility: Secondary | ICD-10-CM

## 2021-12-24 DIAGNOSIS — R278 Other lack of coordination: Secondary | ICD-10-CM | POA: Diagnosis not present

## 2021-12-24 DIAGNOSIS — R41840 Attention and concentration deficit: Secondary | ICD-10-CM

## 2021-12-24 NOTE — Therapy (Signed)
OUTPATIENT OCCUPATIONAL THERAPY Treatment Note  Patient Name: Kara [name] MRN: [id number] DOB:09-09-1985, 36 y.o., female Today's Date: 12/24/2021  PCP: [name], MD REFERRING PROVIDER: Genice Rouge, MD    OT End of Session - 12/24/21 [id number]     Visit Number 2    Number of Visits 9    Date for OT Re-Evaluation 02/12/22    Authorization Type [employer]    OT Start Time 0804    OT Stop Time 0848    OT Time Calculation (min) 44 min    Activity Tolerance Patient tolerated treatment well    Behavior During Therapy [facility] for tasks assessed/performed              Past Medical History:  Diagnosis Date   Alcoholism in remission (HCC)    per pt in remission since 2017   Anemia    AR (allergic rhinitis)    allergist-- dr Lucie Leather   Biliary dyskinesia    Diastolic CHF (HCC)    followd by dr Demetrius Charity. [facility] Emms   Environmental allergies    GERD (gastroesophageal reflux disease)    Headache    History of acute pancreatitis    alcoholic pancreatitis ,  2016 and 2017   History of sepsis 06/12/2016   with CAP   History of syncope    History of thrombosis 01/29/2015   splenic vein thrombosis-- treated with coumadin--- readmitted for abd. hematoma due to coumadin treated with IR embolization of the IMA branches   Hypertension    Polyneuropathy    Prolonged QT syndrome    cardiologist-- dr Eden Emms (notes in epic)   Severe persistent asthma    pulmonologist-- dr dr Belva Crome (notes in epic)   Past Surgical History:  Procedure Laterality Date   CHOLECYSTECTOMY N/A 06/21/2018   Procedure: LAPAROSCOPIC CHOLECYSTECTOMY;  Surgeon: Abigail Miyamoto, MD;  Location: [facility];  Service: General;  Laterality: N/A;   ESOPHAGOGASTRODUODENOSCOPY N/A 07/17/2014   Procedure: ESOPHAGOGASTRODUODENOSCOPY (EGD);  Surgeon: Dorena Cookey, MD;  Location: [facility];  Service: Endoscopy;  Laterality: N/A;   TYMPANOSTOMY TUBE PLACEMENT Bilateral 11 MONTHS OLD   VIDEO BRONCHOSCOPY Bilateral 10/08/2016   Procedure:  VIDEO BRONCHOSCOPY WITH FLUORO;  Surgeon: Chilton Greathouse, MD;  Location: [facility];  Service: Cardiopulmonary;  Laterality: Bilateral;   Patient Active Problem List   Diagnosis Date Noted   Hypomagnesemia 12/15/2021   Chronic diarrhea 12/15/2021   Fracture of multiple ribs 12/15/2021   Anoxic brain injury (HCC) 12/09/2021   Cardiac arrest (HCC) 11/27/2021   Seizure disorder (HCC) 11/27/2021   Chronic anoxic encephalopathy (HCC) 11/27/2021   Tracheostomy status (HCC) 11/27/2021   AKI (acute kidney injury) (HCC)    Seizure (HCC)    Altered mental status    Cardiac arrest (HCC) 11/13/2021   Acute respiratory failure (HCC)    Prolonged Q-T interval on ECG    Hypokalemia    Acute encephalopathy    Cellulitis of right lower extremity 11/04/2021   Septic prepatellar bursitis of right knee 11/04/2021   Generalized tonic-clonic seizure (HCC) 01/20/2021   Ataxia 01/20/2021   Depression 08/05/2020   Acute on chronic pancreatitis (HCC) 11/06/2019   Elevated CK    Malnutrition of moderate degree 10/23/2019   Pancreatic pseudocyst    Pancreatic cyst    Fatty liver    Other allergic rhinitis 05/03/2019   Food allergy 05/03/2019   Tobacco smoker, less than 10 cigarettes per day 05/03/2019   Asthma, severe persistent, well-controlled 11/11/2017   Lung infiltrate  Hyponatremia 08/27/2016   Hyperglycemia 08/27/2016   Acute on chronic respiratory failure with hypoxia (HCC) 08/26/2016   HCAP (healthcare-associated pneumonia) 07/31/2016   Tobacco abuse 07/31/2016   Respiratory failure with hypoxia (HCC) 07/31/2016   CAP (community acquired pneumonia) 06/13/2016   Asthma exacerbation 06/12/2016   Alcohol abuse, in remission 06/12/2016   Sepsis (HCC) 06/12/2016   Chronic pancreatitis (HCC) 09/09/2015   Gastroesophageal reflux disease 09/09/2015   Benign essential HTN 09/09/2015   FUO (fever of unknown origin)    Abdominal hematoma 02/10/2015   Warfarin-induced coagulopathy (HCC)  02/10/2015   Hypertension 02/10/2015   Hypoalbuminemia due to protein-calorie malnutrition (HCC) 02/10/2015   Alcoholic pancreatitis 02/10/2015   Acute blood loss anemia    Intra-abdominal hematoma 02/09/2015   Pancreatitis, acute    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   Acute pancreatitis 01/29/2015   Sinus tachycardia 01/29/2015   Leukocytosis 01/29/2015   AKI (acute kidney injury) (HCC) 01/29/2015   Insomnia 10/29/2014   Polyneuropathy 09/02/2014   History of alcohol abuse 09/02/2014   Edema 09/02/2014   Neck pain 09/02/2014   Chronic lower back pain 09/02/2014   Numbness of foot 08/02/2014   Hand numbness 08/02/2014   Ataxic gait 08/02/2014   Proximal leg weakness 08/02/2014   Constipation    Abdominal pain 07/16/2014   Hypothermia 07/16/2014   Upper abdominal pain    Abdominal pain, epigastric 07/06/2014   Obesity (BMI 30-39.9) 07/06/2014   Chronic diastolic heart failure (HCC) 07/06/2014   Syncope 04/08/2014   Prolonged QT interval 04/08/2014   Hypokalemia 04/08/2014   Elevated LFTs 04/08/2014    ONSET DATE: 11/13/21  REFERRING DIAG:  G93.1 (ICD-10-CM) - Anoxic brain damage, not elsewhere classified  G40.909 (ICD-10-CM) - Epilepsy, unspecified, not intractable, without status epilepticus    THERAPY DIAG:  Other lack of coordination  Attention and concentration deficit  Muscle weakness (generalized)  Unsteadiness on feet  Rationale for Evaluation and Treatment Rehabilitation  SUBJECTIVE:   SUBJECTIVE STATEMENT: Pt reports mild back pain this AM, attributing it to referred pain from her ribs or post activity with PT yesterday.    Pt accompanied by: self and family member (mother)  PERTINENT HISTORY:  HF, HLD, seizure, ETOH use, asthma, anxiety, chronic pain, long QT syndrome.   PRECAUTIONS: Fall  WEIGHT BEARING RESTRICTIONS No  PAIN:  Are you having pain? Yes: NPRS scale: 2/10 Pain location: back Pain description: soreness Aggravating factors:  certain movements Relieving factors: rest  FALLS: Has patient fallen in last 6 months? Yes. Number of falls 4  LIVING ENVIRONMENT: Lives with: lives with their spouse and mother is staying during the week while husband works Lives in: [employer] Stairs: Yes: Internal: full flight to upstairs, but full bath and bedroom on main floor - so no need to go up steps; and External: 1 step at back door, front door has 6 steps; uses back door primarily Has following equipment at home: Environmental consultant - 2 wheeled, Crutches, Grab bars, and shower stool for [employer]  PLOF: Independent, Independent with basic ADLs, Independent with household mobility without device, Independent with community mobility without device, and 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, multi-tasking, computer skills, writing letters of recommendations, phone calls  PATIENT GOALS to improve typing speed and accuracy, multi-tasking without increased stress  OBJECTIVE:   HAND DOMINANCE: Right  ADLs: Overall ADLs: Mod I, mother is in the home in case of emergency during day and spouse at night  IADLs: Shopping: utilized grocery cart for energy conservation Light  housekeeping: putting away laundry Meal Prep: husband does most of the cooking/grilling so he will shop with her  Community mobility: has gone out of the house for errands and out to eat, current not driving for ~ 6 months and was not driving before due to seizures  Medication management: mother is currently assisting due to new meds Financial management: pt was primary, however family has been doing since hospitalization  Handwriting: 100% legible  MOBILITY STATUS: Independent  POSTURE COMMENTS:  No Significant postural limitations  ACTIVITY TOLERANCE: Activity tolerance: WFL for tasks assessed on eval; does report decreased endurance  UPPER EXTREMITY ROM: WFL Bilaterally   UPPER EXTREMITY MMT:     MMT Right eval Left eval   Shoulder flexion 5/5 4+/5  Shoulder abduction    Shoulder adduction    Shoulder extension    Shoulder internal rotation    Shoulder external rotation    Middle trapezius    Lower trapezius    Elbow flexion 5/5 4+/5  Elbow extension 5/5 4+/5  Wrist flexion    Wrist extension    Wrist ulnar deviation    Wrist radial deviation    Wrist pronation    Wrist supination    (Blank rows = not tested)  HAND FUNCTION: Grip strength: Right: 52 lbs; Left: 41 lbs  COORDINATION: 9 Hole Peg test: Right: 23.91 sec; Left: 24.57 sec  COGNITION: Overall cognitive status: Within functional limits for tasks assessed and Pt reports feeling that she needs increased time for processing especially with multi-tasking --------------------------------------------------------------------------------------------------------------------------------------------------------   TODAY'S TREATMENT:  Pill box assessment: Completed in 4:19.28 with no errors.  Pt demonstrating good sequencing and problem solving when filling pill box.   Dual tasking/coordination: small peg board pattern replication with use of key to identify correlating colors, challenging alternating attention, and B hand coordination and dexterity. Pt demonstrating good manipulation of pegs in-hand to place in peg board, increased attention to LUE due to mild impairments of coordination on L.  Pt scanning in linear fashion with good sequencing and awareness.  Pt demonstrating good attention to task when therapist challenging with increased alternating attention.  Educated on functional carryover of task implications with systematic process to minimize onset of errors. Coordination: small peg board pattern replication with alternating LUE and RUE.  Challenged pt to complete in-hand manipulation and translation with placing pegs into palm and translating to finger tips from palm for additional challenge.  Pt demonstrating mild increased difficulty on L,  but only to the extent of non-dominant per pt report.  Educated on functional in-hand manipulation and fine motor control tasks, provided with handout.  PATIENT EDUCATION: Education details: Educated on functional carryover of dual tasking and coordination tasks. Person educated: Patient Education method: Explanation, Demonstration, and Handouts Education comprehension: verbalized understanding and needs further education   HOME EXERCISE PROGRAM: In-hand manipulation and fine motor control handouts from [employer]    GOALS: Goals reviewed with patient? Yes  SHORT TERM GOALS: Target date: 01/15/22  Pt will be independent with [facility] HEP to increase coordination as needed for ADLs and vocation tasks. Baseline: Goal status: IN PROGRESS  2.  Pt will verbalize understanding of energy conservation strategies and be able to verbalize 2 strategies that she is incorporating into IADLs to increase activity tolerance. Baseline:  Goal status: IN PROGRESS  3.  Pt will demonstrate improved typing speed by 5 WPM while maintaining >95% accuracy to demonstrate increased coordination as needed for RTW. Baseline: (5 minutes): 47 wpm x 97% accuracy = 47  wpm on 12/12/21 Goal status: IN PROGRESS   LONG TERM GOALS: Target date: 02/12/22  Pt will demonstrate improved fine motor coordination for ADLs as evidenced by decreasing 9 hole peg test score for BUE by 5 secs Baseline: Right: 23.91 sec; Left: 24.57 sec Goal status: IN PROGRESS  2.  Pt will demonstrate improved typing speed by 10 WPM while maintaining >95% accuracy to demonstrate increased coordination as needed for RTW. Baseline:  Goal status: IN PROGRESS  3.  Pt will complete table top scanning/dual tasking activity at Mod I level. Baseline:  Goal status: IN PROGRESS  4.  Pt will demonstrate and/or verbalize ability to sequence moderately challenging IADL/vocation task (banking, balancing check book/leger, taking phone messages) at Mod I level  demonstrating good alternating attention. Baseline:  Goal status: IN PROGRESS  5.  Pt will verbalize understanding of RTW recommendations and recognize when able to return to work functions. Baseline:  Goal status: IN PROGRESS   ASSESSMENT:  CLINICAL IMPRESSION: Pt seen for first treatment session s/p initial evaluation.  Therapist reviewed established goals with pt, pt in agreement. Pt reports understanding of functional carryover of alternating attention and dual tasking to ADLs, IADLs, and work tasks.  Pt demonstrating good alternating attention this session and good sequencing during table top tasks to increase organization and reduce errors.    PERFORMANCE DEFICITS in functional skills including IADLs, coordination, strength, FMC, balance, body mechanics, endurance, cardiopulmonary status limiting function, and UE functional use, cognitive skills including attention, energy/drive, and sequencing, and psychosocial skills including environmental adaptation and routines and behaviors.   IMPAIRMENTS are limiting patient from IADLs, rest and sleep, and work.   COMORBIDITIES may have co-morbidities  that affects occupational performance. Patient will benefit from skilled OT to address above impairments and improve overall function.  MODIFICATION OR ASSISTANCE TO COMPLETE EVALUATION: Min-Moderate modification of tasks or assist with assess necessary to complete an evaluation.  OT OCCUPATIONAL PROFILE AND HISTORY: Detailed assessment: Review of records and additional review of physical, cognitive, psychosocial history related to current functional performance.  CLINICAL DECISION MAKING: Moderate - several treatment options, min-mod task modification necessary  REHAB POTENTIAL: Good  EVALUATION COMPLEXITY: Low    PLAN: OT FREQUENCY: 1x/week  OT DURATION: 8 weeks  PLANNED INTERVENTIONS: self care/ADL training, therapeutic exercise, therapeutic activity, balance training, functional  mobility training, moist heat, cryotherapy, patient/family education, cognitive remediation/compensation, psychosocial skills training, energy conservation, coping strategies training, and DME and/or AE instructions  RECOMMENDED OTHER SERVICES: N/A  CONSULTED AND AGREED WITH PLAN OF CARE: Patient and family member/caregiver  PLAN FOR NEXT SESSION: begin typing program, dual tasking, executive functioning tasks (to include balancing check book/ledger)   Kristol Almanzar, OTR/L 12/24/2021, 9:02 AM

## 2021-12-28 ENCOUNTER — Other Ambulatory Visit: Payer: Self-pay | Admitting: Physical Medicine and Rehabilitation

## 2021-12-28 ENCOUNTER — Encounter: Payer: Self-pay | Admitting: Neurology

## 2021-12-28 ENCOUNTER — Ambulatory Visit (INDEPENDENT_AMBULATORY_CARE_PROVIDER_SITE_OTHER): Payer: BC Managed Care – PPO | Admitting: Neurology

## 2021-12-28 VITALS — BP 145/93 | HR 105 | Ht 65.0 in | Wt 189.0 lb

## 2021-12-28 DIAGNOSIS — R270 Ataxia, unspecified: Secondary | ICD-10-CM | POA: Diagnosis not present

## 2021-12-28 DIAGNOSIS — G9340 Encephalopathy, unspecified: Secondary | ICD-10-CM | POA: Diagnosis not present

## 2021-12-28 DIAGNOSIS — F32A Depression, unspecified: Secondary | ICD-10-CM

## 2021-12-28 DIAGNOSIS — I4581 Long QT syndrome: Secondary | ICD-10-CM | POA: Insufficient documentation

## 2021-12-28 DIAGNOSIS — G40409 Other generalized epilepsy and epileptic syndromes, not intractable, without status epilepticus: Secondary | ICD-10-CM

## 2021-12-28 DIAGNOSIS — G931 Anoxic brain damage, not elsewhere classified: Secondary | ICD-10-CM

## 2021-12-28 NOTE — Progress Notes (Signed)
[facility]  PATIENT: Kara [name] DOB: 08/14/1985  REFERRING DOCTOR OR PCP:  Lona Kettle, MD SOURCE: patient, notes from primary care, notes from neurology 2018/2019, imaging from 2016.  _________________________________   HISTORICAL  CHIEF COMPLAINT:  Chief Complaint  Patient presents with   Follow-up    RM 16. Last seen 10/28/21. Recently at [facility] for hospitalization. Found med interaction. Has some anxiety. Doing ST/PT/OT right now. Going through rehab at [facility].     HISTORY OF PRESENT ILLNESS:  Kara [name], at [facility] for neurologic consultation regarding seizures.  Update 12/28/2021 She had a cardiac arrest 11/13/2021 felt to be due to long QT.  Also had low K and Mg at the time.  She needed electroconversion and was hospitalized a total of 1 month  MRI showed restricted diffusion concerning for cytotoxic edema 11/18/2021 but she has recovered a lot.   She still has trouble on stairs.  Her right leg is a little weaker and mild reduced coordination of right hand.   She has mild reduced memory and has increased anxiety since the incident.   She was intubated and has a trach scar now.     At the time she was also on Quetiapine qHS for insomnia and anxiety and Topamax 100 mg po bid for seizures.    However as she was found to have long QT, she was changed to Vimpat $RemoveB'100mg'FAUnnjvN$  po bid and low dose trazodone for insomnia.    With trazodone, she sleeps well most nights. = 8 hours most nights.     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       Seizure history: In September 2022, she was watching TV in bed and next thing she remembers is her husband yelling "Are you ok are you ok".   He witnessed her having a generalized tonic clonic seizure lasting 30 seconds   Arms clenched up  and then she started to shake.   She bit the the inside of her mouth.   She had no incontinence.  She was back to baseline fairly quickly.   Her husband told her later that she had a couple staring spells while eating a couple days earlier.   The 24 hours before the event were fairly normal.   No alcohol or drug use.  She thinks she had slept well the previous night.   No history of seizures prior to this event.  She used to drink daily but is sober x 4 years.   She has no FH of seizures.   Her paternal aunt had MS.      Due to the above event she was referred on 01/20/2021. While being examined, she had a seizure witnessed by me.  She had just stood up for me to assess her gait.  She took a few steps.  She then became rigid and fell backwards against the wall.     She then had 30 seconds of bilateral symmetric clonic activity.  She then became still and was unresponsive for about 1 minute and then was responsive to localize sound.  She began to speak about 3 to 4 minutes later.  About 10 minutes after the seizure she was able to answer detailed questions.    She does not have memory of  the event.   She was admitted to [facility].  EEG later that day was normal.  MRI of the brain was normal.  IMAGING REVIEWED CT Head 12/07/2020 was normal.     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 There is right greater than left foraminal narrowing. There is no spinal cord abnormality noted.     MRI of the  brain 07/27/2014 was normal.     MRI of the brain 01/20/2021 was normal.       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    REVIEW OF SYSTEMS: Constitutional: No fevers, chills, sweats, or change in appetite Eyes: No visual changes, double vision, eye pain Ear, nose and throat: No hearing loss, ear pain, nasal congestion, sore throat Cardiovascular: No chest pain, palpitations Respiratory:  No shortness of breath at rest or with exertion.    No wheezes GastrointestinaI: No nausea, vomiting, diarrhea, abdominal pain, fecal incontinence Genitourinary:  No dysuria, urinary retention or frequency.  No nocturia. Musculoskeletal:  No neck pain, back pain Integumentary: No rash, pruritus, skin lesions Neurological: as above Psychiatric: She has depression Endocrine: No palpitations, diaphoresis, change in appetite, change in weigh or increased thirst Hematologic/Lymphatic:  No anemia, purpura, petechiae. Allergic/Immunologic: No itchy/runny eyes, nasal congestion, recent allergic reactions, rashes  ALLERGIES: Allergies  Allergen Reactions   Maxzide [Hydrochlorothiazide W-Triamterene] Nausea Only and Other (See Comments)    Dizziness    Nsaids Diarrhea and Nausea And Vomiting   Other Hives, Itching and Other (See Comments)    TREE NUT - itching, hives   Peanut Butter Flavor Hives and Itching   Peanuts [Peanut Oil] Hives and Itching   Tylenol [Acetaminophen] Diarrhea, Nausea And Vomiting and Other (See Comments)    Elevated LFT    Wellbutrin [Bupropion] Other (See Comments)    Lowered seizure threshold    HOME MEDICATIONS:  Current Outpatient Medications:    atorvastatin (LIPITOR) 10 MG tablet, Take 1 tablet (10 mg total) by mouth at bedtime., Disp: 30 tablet, Rfl: 0   blood glucose meter kit and supplies KIT, Dispense based on patient and insurance preference. Use up to four times daily as directed., Disp: 1 each, Rfl: 0   budesonide (PULMICORT) 0.25 MG/2ML nebulizer solution, Take 2 mLs (0.25 mg total) by nebulization 2 (two) times daily., Disp: 60 mL, Rfl: 12   cetirizine (ZYRTEC) 10 MG tablet, Take 1 tablet (10 mg total) by mouth daily., Disp: 30 tablet, Rfl: 0   clonazePAM (KLONOPIN) 0.5 MG tablet, Take 1 tablet (0.5 mg total) by mouth 2 (two) times daily as needed (anxiety)., Disp: 30 tablet, Rfl: 0   diphenhydrAMINE (BENADRYL) 25 MG tablet, Take 25 mg by mouth every 6 (six) hours as needed for itching., Disp: , Rfl:     EPINEPHrine (EPIPEN 2-PAK) 0.3 mg/0.3 mL IJ SOAJ injection, Inject 0.3 mLs (0.3 mg total) into the muscle once as needed (for severe allergic reaction)., Disp: 2 Device, Rfl: 1   folic acid (FOLVITE) 1 MG tablet, Take 1 tablet (1 mg total) by mouth daily., Disp: 30 tablet, Rfl: 0   gabapentin (NEURONTIN) 300 MG capsule, Take 3 capsules (900 mg total) by mouth 2 (two) times daily., Disp: 180 capsule, Rfl: 0   Lacosamide 100 MG TABS, Take 1 tablet (100 mg total) by mouth 2 (two) times daily., Disp: 60 tablet, Rfl: 5   lidocaine (LIDODERM) 5 %, Place 1 patch onto the skin daily. Remove & Discard patch  within 12 hours or as directed by MD, Disp: 30 patch, Rfl: 0   magnesium oxide (MAGOX 400) 400 (240 Mg) MG tablet, Take 2 tablets (800 mg total) by mouth 2 (two) times daily., Disp: 120 tablet, Rfl: 0   melatonin 3 MG TABS tablet, Take 3 mg by mouth at bedtime., Disp: , Rfl:    metFORMIN (GLUCOPHAGE) 500 MG tablet, Take 1 tablet (500 mg total) by mouth 2 (two) times daily with a meal., Disp: 60 tablet, Rfl: 0   metoprolol tartrate (LOPRESSOR) 25 MG tablet, Take 1 tablet (25 mg total) by mouth 2 (two) times daily., Disp: 60 tablet, Rfl: 0   montelukast (SINGULAIR) 10 MG tablet, TAKE ONE TABLET BY MOUTH AT BEDTIME (Patient taking differently: Take 10 mg by mouth at bedtime.), Disp: 30 tablet, Rfl: 0   naloxone (NARCAN) nasal spray 4 mg/0.1 mL, SMARTSIG:Both Nares, Disp: , Rfl:    omeprazole (PRILOSEC) 40 MG capsule, TAKE 1 CAPSULE(40 MG) BY MOUTH DAILY, Disp: 30 capsule, Rfl: 0   oxyCODONE (ROXICODONE) 15 MG immediate release tablet, Take 0.5 tablets (7.5 mg total) by mouth every 8 (eight) hours as needed for pain. This is an increase from  5 mg to 7.5 mg three times a day as needed. Ninety pills were filled on 10/21/21 prior to your admission on 07/19. Now that you are taking less you should have almost a month supply. (Patient taking differently: Take 10 mg by mouth every 8 (eight) hours as needed for pain.  This is an increase from  5 mg to 7.5 mg three times a day as needed. Ninety pills were filled on 10/21/21 prior to your admission on 07/19. Now that you are taking less you should have almost a month supply.), Disp: 30 tablet, Rfl: 0   Pancrelipase, Lip-Prot-Amyl, (ZENPEP) 20000-63000 units CPEP, Take 2 capsules by mouth 3 (three) times daily., Disp: 180 capsule, Rfl: 0   polycarbophil (FIBERCON) 625 MG tablet, Take 1 tablet (625 mg total) by mouth 2 (two) times daily., Disp: 60 tablet, Rfl: 0   polyvinyl alcohol (LIQUIFILM TEARS) 1.4 % ophthalmic solution, Place 1 drop into both eyes as needed for dry eyes., Disp: 15 mL, Rfl: 0   Probiotic Product (DIGESTIVE ADVANTAGE) CAPS, Take 1 capsule by mouth daily., Disp: , Rfl:    saccharomyces boulardii (FLORASTOR) 250 MG capsule, Take 1 capsule (250 mg total) by mouth 2 (two) times daily., Disp: 60 capsule, Rfl: 0   sertraline (ZOLOFT) 100 MG tablet, Take 1 tablet (100 mg total) by mouth daily., Disp: 30 tablet, Rfl: 0   thiamine (VITAMIN B-1) 100 MG tablet, Take 1 tablet (100 mg total) by mouth daily., Disp: 30 tablet, Rfl: 0   traZODone (DESYREL) 150 MG tablet, Take 0.5 tablets (75 mg total) by mouth at bedtime., Disp: 30 tablet, Rfl: 0  PAST MEDICAL HISTORY: Past Medical History:  Diagnosis Date   Alcoholism in remission ([place])    per pt in remission since 2017   Anemia    AR (allergic rhinitis)    allergist-- dr Neldon Mc   Biliary dyskinesia    Diastolic CHF ([place])    followd by dr Mamie Nick. Johnsie Cancel   Environmental allergies    GERD (gastroesophageal reflux disease)    Headache    History of acute pancreatitis    alcoholic pancreatitis ,  5520 and 2017   History of sepsis 06/12/2016   with CAP   History of syncope    History of thrombosis 01/29/2015   splenic vein  thrombosis-- treated with coumadin--- readmitted for abd. hematoma due to coumadin treated with IR embolization of the IMA branches   Hypertension    Polyneuropathy    Prolonged QT  syndrome    cardiologist-- dr Johnsie Cancel (notes in epic)   Severe persistent asthma    pulmonologist-- dr dr Bernita Raisin (notes in epic)    PAST SURGICAL HISTORY: Past Surgical History:  Procedure Laterality Date   CHOLECYSTECTOMY N/A 06/21/2018   Procedure: LAPAROSCOPIC CHOLECYSTECTOMY;  Surgeon: Coralie Keens, MD;  Location: [facility];  Service: General;  Laterality: N/A;   ESOPHAGOGASTRODUODENOSCOPY N/A 07/17/2014   Procedure: ESOPHAGOGASTRODUODENOSCOPY (EGD);  Surgeon: Teena Irani, MD;  Location: [facility];  Service: Endoscopy;  Laterality: N/A;   TYMPANOSTOMY TUBE PLACEMENT Bilateral 11 MONTHS OLD   VIDEO BRONCHOSCOPY Bilateral 10/08/2016   Procedure: VIDEO BRONCHOSCOPY WITH FLUORO;  Surgeon: Marshell Garfinkel, MD;  Location: [place];  Service: Cardiopulmonary;  Laterality: Bilateral;    FAMILY HISTORY: Family History  Problem Relation Age of Onset   Heart failure Mother    Colon polyps Mother    Heart failure Father    Diabetes Father    Heart failure Maternal Grandfather    Colon cancer Neg Hx     SOCIAL HISTORY:  Social History   Socioeconomic History   Marital status: Married    Spouse name: Winferd Humphrey   Number of children: 0   Years of education: Not on file   Highest education level: Not on file  Occupational History   Occupation: Investment banker, corporate  Tobacco Use   Smoking status: Never   Smokeless tobacco: Never  Vaping Use   Vaping Use: Unknown  Substance and Sexual Activity   Alcohol use: Not Currently    Comment: pt hx alcohol abuse , in remission since 2017   Drug use: Not Currently    Comment: "POT WHEN I WAS IN HIGH SCHOOL"   Sexual activity: Yes    Birth control/protection: None  Other Topics Concern   Not on file  Social History Narrative   ** Merged History Encounter **       Married.   Social Determinants of Health   Financial Resource Strain: Not on file  Food Insecurity: Not on file  Transportation Needs: Not on file  Physical Activity:  Not on file  Stress: Not on file  Social Connections: Not on file  Intimate Partner Violence: Not on file     PHYSICAL EXAM  Vitals:   12/28/21 1305  BP: (!) 145/93  Pulse: (!) 105  Weight: 189 lb (85.7 kg)  Height: $Remove'5\' 5"'DuVHSZu$  (1.651 m)    Body mass index is 31.45 kg/m.   General: The patient is well-developed and well-nourished and in no acute distress  HEENT:  Head is [place]/AT.  Sclera are anicteric.  Skin: Extremities are without rash or  edema.  Neurologic Exam  Mental status: The patient is alert and oriented x 3 at the time of the examination. The patient has apparent normal recent and remote memory, with an apparently normal attention span and concentration ability.   Speech is normal.  Cranial nerves: Extraocular movements are full.  Facial strength and sensation was normal.. No obvious hearing deficits are noted.  Motor:  Muscle bulk is normal.   Tone is normal. Strength is  5 / 5 in all 4 extremities.   Sensory: Intact vibration in arms and legs  Coordination: Cerebellar testing reveals good finger-nose-finger and heel-to-shin bilaterally.  Gait and station: Station is normal.   Gait  is normal.  Tandem gait is wide.  Reflexes: Deep tendon reflexes are symmetric and normal bilaterally.     DIAGNOSTIC DATA (LABS, IMAGING, TESTING) - I reviewed patient records, labs, notes, testing and imaging myself where available.  Lab Results  Component Value Date   WBC 9.5 12/14/2021   HGB 11.2 (L) 12/14/2021   HCT 33.7 (L) 12/14/2021   MCV 93.1 12/14/2021   PLT 143 (L) 12/14/2021      Component Value Date/Time   NA 133 (L) 12/14/2021 0658   NA 139 10/15/2016 0825   K 4.4 12/14/2021 0658   CL 97 (L) 12/14/2021 0658   CO2 25 12/14/2021 0658   GLUCOSE 406 (H) 12/14/2021 0658   BUN 14 12/14/2021 0658   BUN 11 10/15/2016 0825   CREATININE 0.73 12/14/2021 0658   CALCIUM 9.3 12/14/2021 0658   PROT 6.9 12/10/2021 0502   PROT 7.6 09/02/2014 1507   ALBUMIN 2.9 (L)  12/10/2021 0502   ALBUMIN 4.1 09/02/2014 1507   AST 53 (H) 12/10/2021 0502   ALT 17 12/10/2021 0502   ALKPHOS 221 (H) 12/10/2021 0502   BILITOT 0.9 12/10/2021 0502   BILITOT 0.7 09/02/2014 1507   GFRNONAA >60 12/14/2021 0658   GFRAA >60 11/12/2019 0435   Lab Results  Component Value Date   CHOL 174 09/09/2015   HDL 57 09/09/2015   LDLCALC 100 (H) 09/09/2015   TRIG 112 12/05/2021   CHOLHDL 3.1 09/09/2015   Lab Results  Component Value Date   HGBA1C 4.5 (L) 11/27/2021   Lab Results  Component Value Date   WPVXYIAX65 537 09/02/2014   Lab Results  Component Value Date   TSH 13.439 (H) 11/27/2021       ASSESSMENT AND PLAN  Encephalopathy - Plan: MR BRAIN WO CONTRAST  Generalized tonic-clonic seizure ([place])  Depression, unspecified depression type  Ataxia  Anoxic brain injury ([place])  Long Q-T syndrome   Continue Vimpat 100 mg p.o. twice daily.  Repeat MRI around time of next visit as questionable DWI abnormalities  on 11/18/2021 a couple days after anoxic event.  Due to this abnormality, I will want to reimage an MRI in another 4 or 5 months and see her afterwards. Remain off Wellbutrin and quetiapine.   Will also stay off topiramate.     Trazodone is low dose - we discussed trying to stop it and see if insomnia does ok as it may prolong QT.   she will follow-up with [place] in 5 months or call sooner call if new or worsening neurologic symptoms  45-minute office visit with the majority of the time spent face-to-face for history and physical, discussion/counseling and decision-making.  Additional time with record review (recent 1 month long hospitalization with multiple studies) and documentation.    Dain Laseter A. Felecia Shelling, MD, [facility] 4/82/7078, 6:75 PM Certified in Neurology, Clinical Neurophysiology, Sleep Medicine and Neuroimaging  [facility] [address], [place], [place] [address] [phone]

## 2021-12-29 ENCOUNTER — Encounter: Payer: Self-pay | Admitting: Rehabilitative and Restorative Service Providers"

## 2021-12-29 ENCOUNTER — Ambulatory Visit: Payer: BC Managed Care – PPO

## 2021-12-29 ENCOUNTER — Ambulatory Visit: Payer: BC Managed Care – PPO | Admitting: Rehabilitative and Restorative Service Providers"

## 2021-12-29 ENCOUNTER — Ambulatory Visit: Payer: BC Managed Care – PPO | Admitting: Occupational Therapy

## 2021-12-29 DIAGNOSIS — R2681 Unsteadiness on feet: Secondary | ICD-10-CM

## 2021-12-29 DIAGNOSIS — R278 Other lack of coordination: Secondary | ICD-10-CM | POA: Diagnosis not present

## 2021-12-29 DIAGNOSIS — M6281 Muscle weakness (generalized): Secondary | ICD-10-CM

## 2021-12-29 DIAGNOSIS — R2689 Other abnormalities of gait and mobility: Secondary | ICD-10-CM

## 2021-12-29 DIAGNOSIS — R41840 Attention and concentration deficit: Secondary | ICD-10-CM

## 2021-12-29 NOTE — Therapy (Signed)
OUTPATIENT PHYSICAL THERAPY NEURO TREATMENT   Patient Name: Kara [name] MRN: [id number] DOB:April 02, 1986, 36 y.o., female Today's Date: 12/29/2021   PCP: Lawerance Cruel, MD REFERRING PROVIDER: Courtney Heys, MD   PT End of Session - 12/29/21 1141     Visit Number 4    Number of Visits 13    Date for PT Re-Evaluation 01/29/22    Authorization Type BCBS    PT Start Time 1950    PT Stop Time 1228    PT Time Calculation (min) 44 min    Equipment Utilized During Treatment Gait belt    Activity Tolerance Patient tolerated treatment well    Behavior During Therapy WFL for tasks assessed/performed              Past Medical History:  Diagnosis Date   Alcoholism in remission ([place])    per pt in remission since 2017   Anemia    AR (allergic rhinitis)    allergist-- dr Neldon Mc   Biliary dyskinesia    Diastolic CHF ([place])    followd by dr Mamie Nick. Johnsie Cancel   Environmental allergies    GERD (gastroesophageal reflux disease)    Headache    History of acute pancreatitis    alcoholic pancreatitis ,  9326 and 2017   History of sepsis 06/12/2016   with CAP   History of syncope    History of thrombosis 01/29/2015   splenic vein thrombosis-- treated with coumadin--- readmitted for abd. hematoma due to coumadin treated with IR embolization of the IMA branches   Hypertension    Polyneuropathy    Prolonged QT syndrome    cardiologist-- dr Johnsie Cancel (notes in epic)   Severe persistent asthma    pulmonologist-- dr dr Bernita Raisin (notes in epic)   Past Surgical History:  Procedure Laterality Date   CHOLECYSTECTOMY N/A 06/21/2018   Procedure: LAPAROSCOPIC CHOLECYSTECTOMY;  Surgeon: Coralie Keens, MD;  Location: [facility];  Service: General;  Laterality: N/A;   ESOPHAGOGASTRODUODENOSCOPY N/A 07/17/2014   Procedure: ESOPHAGOGASTRODUODENOSCOPY (EGD);  Surgeon: Teena Irani, MD;  Location: [facility];  Service: Endoscopy;  Laterality: N/A;   TYMPANOSTOMY TUBE PLACEMENT Bilateral 11 MONTHS OLD    VIDEO BRONCHOSCOPY Bilateral 10/08/2016   Procedure: VIDEO BRONCHOSCOPY WITH FLUORO;  Surgeon: Marshell Garfinkel, MD;  Location: [facility];  Service: Cardiopulmonary;  Laterality: Bilateral;   Patient Active Problem List   Diagnosis Date Noted   Long Q-T syndrome 12/28/2021   Hypomagnesemia 12/15/2021   Chronic diarrhea 12/15/2021   Fracture of multiple ribs 12/15/2021   Anoxic brain injury ([place]) 12/09/2021   Cardiac arrest ([place]) 11/27/2021   Seizure disorder ([place]) 11/27/2021   Chronic anoxic encephalopathy ([place]) 11/27/2021   Tracheostomy status ([place]) 11/27/2021   AKI (acute kidney injury) ([place])    Seizure ([place])    Altered mental status    Cardiac arrest ([place]) 11/13/2021   Acute respiratory failure ([facility])    Prolonged Q-T interval on ECG    Hypokalemia    Encephalopathy    Cellulitis of right lower extremity 11/04/2021   Septic prepatellar bursitis of right knee 11/04/2021   Generalized tonic-clonic seizure ([place]) 01/20/2021   Ataxia 01/20/2021   Depression 08/05/2020   Acute on chronic pancreatitis ([place]) 11/06/2019   Elevated CK    Malnutrition of moderate degree 10/23/2019   Pancreatic pseudocyst    Pancreatic cyst    Fatty liver    Other allergic rhinitis 05/03/2019   Food allergy 05/03/2019   Tobacco smoker, less than 10 cigarettes per day  05/03/2019   Asthma, severe persistent, well-controlled 11/11/2017   Lung infiltrate    Hyponatremia 08/27/2016   Hyperglycemia 08/27/2016   Acute on chronic respiratory failure with hypoxia ([place]) 08/26/2016   HCAP (healthcare-associated pneumonia) 07/31/2016   Tobacco abuse 07/31/2016   Respiratory failure with hypoxia ([place]) 07/31/2016   CAP (community acquired pneumonia) 06/13/2016   Asthma exacerbation 06/12/2016   Alcohol abuse, in remission 06/12/2016   Sepsis ([place]) 06/12/2016   Chronic pancreatitis ([place]) 09/09/2015   Gastroesophageal reflux disease 09/09/2015   Benign essential HTN 09/09/2015   FUO (fever of unknown  origin)    Abdominal hematoma 02/10/2015   Warfarin-induced coagulopathy ([place]) 02/10/2015   Hypertension 02/10/2015   Hypoalbuminemia due to protein-calorie malnutrition ([place]) 62/56/3893   Alcoholic pancreatitis 73/42/8768   Acute blood loss anemia    Intra-abdominal hematoma 02/09/2015   Pancreatitis, acute    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   Acute pancreatitis 01/29/2015   Sinus tachycardia 01/29/2015   Leukocytosis 01/29/2015   AKI (acute kidney injury) ([place]) 01/29/2015   Insomnia 10/29/2014   Polyneuropathy 09/02/2014   History of alcohol abuse 09/02/2014   Edema 09/02/2014   Neck pain 09/02/2014   Chronic lower back pain 09/02/2014   Numbness of foot 08/02/2014   Hand numbness 08/02/2014   Ataxic gait 08/02/2014   Proximal leg weakness 08/02/2014   Constipation    Abdominal pain 07/16/2014   Hypothermia 07/16/2014   Upper abdominal pain    Abdominal pain, epigastric 07/06/2014   Obesity (BMI 30-39.9) 07/06/2014   Chronic diastolic heart failure ([place]) 07/06/2014   Syncope 04/08/2014   Prolonged QT interval 04/08/2014   Hypokalemia 04/08/2014   Elevated LFTs 04/08/2014    ONSET DATE: 11/13/2021  REFERRING DIAG:  G93.1 (ICD-10-CM) - Anoxic brain damage, not elsewhere classified  G40.909 (ICD-10-CM) - Epilepsy, unspecified, not intractable, without status epilepticus    THERAPY DIAG:  Muscle weakness (generalized)  Unsteadiness on feet  Other abnormalities of gait and mobility  Rationale for Evaluation and Treatment Rehabilitation  SUBJECTIVE:                                                                                                                                                                                              SUBJECTIVE STATEMENT: The patient reports her back is still a little sore today-- she attributes it to prolonged positioning for sleep-- different positions since ribs are fractured. She is doing HEP that were added on 9/7. Pt  accompanied by: self and family member (Mom)  PERTINENT HISTORY: Pt is a 36 y.o. female admitted 11/13/21 with v-fib/VT cardiac arrest, pt noted to have some  posturing and possible myoclonic activity; UDS negative. EEG without seizure activity. MRI concerning for restricted diffusion in the bilateral posterior frontal, parietal, and superior occipital cortex, concerning for cytotoxic edema from hypoxic ischemic injury. ETT 7/28-8/3. PMH includes HF, HLD, seizure, ETOH use, asthma, anxiety, chronic pain, long QT syndrome.  She participated in inpatient rehab 12/09/21-12/15/21.  PAIN:  Are you having pain? Yes: NPRS scale: 2/10 Pain location: LB Pain description: sore Aggravating factors: working out, positioning Relieving factors: rest  PRECAUTIONS: Fall and Other: broken ribs from CPR  PATIENT GOALS Walking, leg strength, and balance is my main concern  OBJECTIVE:  TREATMENT 12/29/21: THEREX: Nu-step warm up x 5 minutes UEs/LEs level 5 Standing with one foot on 12" surface rocking anterior/posteriorly Step ups R and L side to 6" step with one UE x 10 reps into unilateral standing Seated Ankle DF A/ROM x 10 reps Supine trunk rocking with arms in T position x 5 reps P/ROM hamstring stretch supine AAROM hamstring stretch with str Hooklying bent knee fallouts x 10 reps  Towel stretch (perpendicular to spine) at lumbar region adding hooklying bent knee fallouts x 5 reps Supine marching x 10 reps Standing shoulder ab/adduction horizontal plane at 90 x 8 reps for postural cues  NMR: Standing balance board ant/posterior and lateral directions dec'ing UE support, adding head motion and alternating UE reaching overhead Sidestepping squat x 8 reps R and L  GAIT: Gait with cues bilateral heelstrike at initial contact, with focus on knee extension Gait with backwards walking with CGA Tandem gait with CGA Gait with marching with alternating UEs with CGA  HOME EXERCISE PROGRAM Addended on  12/29/21 Access Code: [email or web address] URL: [email or web address] Date: 12/29/2021 Prepared by: Rudell Cobb  Exercises - Seated Heel Slide  - 1-2 x daily - 5 x weekly - 1 sets - 5 reps - 10 sec hold - Standing Gastroc Stretch on Step  - 1-2 x daily - 7 x weekly - 1 sets - 3 reps - Supine Calf Stretch with Strap  - 1 x daily - 5 x weekly - 2 sets - 10 reps - Seated Ankle Dorsiflexion AROM  - 1 x daily - 5 x weekly - 2 sets - 10 reps - Standing Single Leg Stance with Counter Support  - 1 x daily - 5 x weekly - 2 sets - 30 sec hold - Forward Step Up  - 1 x daily - 5 x weekly - 2 sets - 10 reps - Hooklying Hamstring Stretch with Strap  - 2 x daily - 7 x weekly - 1 sets - 2 reps - 30 seconds hold  PATIENT EDUCATION: Education details: HEP update Person educated: Patient Education method: Explanation, Demonstration, Tactile cues, Verbal cues, and Handouts Education comprehension: verbalized understanding and returned demonstration ____________________________________________________________________________________  TREATMENT: 12/721 Activity Comments  Nustep L5 x 6 min Ues/LEs Maintaining ~60 SPM  sitting B toe raise 10x Limited R>L  AAROM R ankle DF with strap 10x  Good control  R gastroc stretch with strap 2x30"    L/R step up/back on 6" step 5x each 1 UE support   L/R step up/back on 2" step 5x each No UE support  L/R step up/back on 6" step 5x each Good stability, limited eccentric control B  SLS 30" each More ankle instability on R  Ant/pos monster walk with yellow TB 2x11f CGA d/t fear of falling     HOME EXERCISE PROGRAM Last updated: 12/24/21 Access Code: [email or web address] URL: [email or web address] Date: 12/24/2021 Prepared  by: [facility]  Exercises - Seated Heel Slide  - 1-2 x daily - 5 x weekly - 1 sets - 5 reps - 10 sec hold - Standing Gastroc Stretch on Step  - 1-2 x daily - 7 x weekly - 1 sets - 3 reps -  Supine Calf Stretch with Strap  - 1 x daily - 5 x weekly - 2 sets - 10 reps - Seated Ankle Dorsiflexion AROM  - 1 x daily - 5 x weekly - 2 sets - 10 reps - Standing Single Leg Stance with Counter Support  - 1 x daily - 5 x weekly - 2 sets - 30 sec hold - Forward Step Up  - 1 x daily - 5 x weekly - 2 sets - 10 reps  PATIENT EDUCATION: Education details: HEP update Person educated: Patient Education method: Explanation, Demonstration, Tactile cues, Verbal cues, and Handouts Education comprehension: verbalized understanding and returned demonstration   Below measures were taken at time of initial evaluation unless otherwise specified:   DIAGNOSTIC FINDINGS: See above  COGNITION: Overall cognitive status: Within functional limits for tasks assessed   SENSATION: Light touch: WFL  POSTURE: rounded shoulders, forward head, and posterior pelvic tilt  LOWER EXTREMITY ROM:   WFL except ankle dorsiflexion:  see below  Active  Right Eval Left Eval  Hip flexion    Hip extension    Hip abduction    Hip adduction    Hip internal rotation    Hip external rotation    Knee flexion    Knee extension    Ankle dorsiflexion Active:  -4 from neutral; passive + 10 Active:  -4, passive +4  Ankle plantarflexion    Ankle inversion    Ankle eversion     (Blank rows = not tested)  LOWER EXTREMITY MMT:    MMT Right Eval Left Eval  Hip flexion 3+ 3+  Hip extension    Hip abduction 4 4  Hip adduction 4 4  Hip internal rotation    Hip external rotation    Knee flexion 4 4  Knee extension 4 4  Ankle dorsiflexion 3- 3-  Ankle plantarflexion    Ankle inversion    Ankle eversion    (Blank rows = not tested)   TRANSFERS: Assistive device utilized: None  Sit to stand: SBA Stand to sit: SBA  STAIRS:  Level of Assistance: SBA  Stair Negotiation Technique: Alternating Pattern  with Bilateral Rails  Number of Stairs: 3   Height of Stairs: 4'-6"  Comments: Decreased foot clearance with  descending steps; leads with front part of foot(decreased heelstrike on step)  GAIT: Gait pattern: step through pattern, decreased step length- Right, decreased step length- Left, decreased ankle dorsiflexion- Right, decreased ankle dorsiflexion- Left, Right steppage, Left steppage, and narrow BOS Distance walked: 40 ft x 6 reps Assistive device utilized: None Level of assistance: SBA Comments:  Mild steppage gait on level, indoor surfaces due to decreased ankle dorsiflexion strength  FUNCTIONAL TESTs:  5 times sit to stand: 15.29 sec no UE support, wide BOS Timed up and go (TUG): 11.44 sec  M-CTSIB  Condition 1: Firm Surface, EO 30 Sec, Normal Sway  Condition 2: Firm Surface, EC 30 Sec, Mild Sway  Condition 3: Foam Surface, EO 30 Sec, Mild Sway  Condition 4: Foam Surface, EC 30 Sec, Mild Sway    SLS:  R and L:  10 seconds; Trendelenburg noted on RLE Tandem:  30  seconds each foot position; Trendelenburg with RLE in posterior position. Gait velocity:  10.22 sec in 20 ft:  1.96 ft/sec FGA:  23/30 (Scores <22/30 indicate increased fall risk)     GOALS: Goals reviewed with patient? Yes  SHORT TERM GOALS: Target date: 01/15/2022  Pt will be independent with HEP for improved strength, balance, gait. Baseline: Goal status: MET  2.  Pt will improve 5x sit<>stand to less than or equal to 12.5 sec to demonstrate improved functional strength and transfer efficiency. Baseline: 15.29 sec with wide BOS Goal status: IN PROGRESS  3.  Pt will verbalize understanding of fall prevention in home environment.  Baseline:  Goal status: IN PROGRESS  LONG TERM GOALS: Target date: 01/29/2022  Pt will be independent with HEP for improved strength, balance, transfers, and gait. Baseline:  Goal status: IN PROGRESS  2.  Pt will improve 5x sit<>stand to less than or equal to 10 sec to demonstrate improved functional strength and transfer efficiency. Baseline: 15.29 sec Goal status: IN  PROGRESS  3.  Pt will improve FGA score to at least 26/30 to decrease fall risk. Baseline: 23/30 Goal status: IN PROGRESS  4.  Pt will improve gait velocity to at least 2.62 ft/sec for improved gait efficiency and safety. Baseline: 1.96 ft/sec Goal status: IN PROGRESS  ASSESSMENT:  CLINICAL IMPRESSION: The patient continues with some soreness in low back.  PT performed some gentle supine there ex to begin to help reduce stiffness and improve flexibility. Patient progressed in session with gait mechanics showing improved heel strike and trunk motion with gait.  PT continuing to progress balance and standing strengthening.  Will continue to work to STGs/LTGs to patient tolerance.  OBJECTIVE IMPAIRMENTS Abnormal gait, decreased balance, decreased mobility, difficulty walking, decreased ROM, decreased strength, and postural dysfunction.   ACTIVITY LIMITATIONS standing, stairs, transfers, and locomotion level  PARTICIPATION LIMITATIONS: shopping, community activity, and occupation  PERSONAL FACTORS 3+ comorbidities: see above problem list and PMH  are also affecting patient's functional outcome.   REHAB POTENTIAL: Good  CLINICAL DECISION MAKING: Evolving/moderate complexity  EVALUATION COMPLEXITY: Moderate  PLAN: PT FREQUENCY: 2x/week  PT DURATION: 6 weeks plus eval  PLANNED INTERVENTIONS: Therapeutic exercises, Therapeutic activity, Neuromuscular re-education, Balance training, Gait training, Patient/Family education, Self Care, Stair training, Orthotic/Fit training, and Manual therapy  PLAN FOR NEXT SESSION: stairs and curbs; continue to add to HEP for hip and ankle strengthening; work on ankle flexibility; compliant surface balance   Javarie Crisp, PT 12/29/21   [facility] at [facility] [address] [place], [place] [address] Phone # [phone] Fax # [phone]

## 2021-12-29 NOTE — Patient Instructions (Signed)
Typing Websites:  Typing.com [email or web address] [email or web address].[email or web address] TypingTest.com   Mouse Accuracy:  [email or web address].com  

## 2021-12-29 NOTE — Therapy (Signed)
OUTPATIENT OCCUPATIONAL THERAPY Treatment Note  Patient Name: Kara [name] MRN: [id number] DOB:[birth year], 36 y.o., female Today's Date: 12/29/2021  PCP: [name], MD REFERRING PROVIDER: Genice Rouge, MD    OT End of Session - 12/29/21 1138     Visit Number 3    Number of Visits 9    Date for OT Re-Evaluation 02/12/22    Authorization Type [employer]    OT Start Time 1103    OT Stop Time 1145    OT Time Calculation (min) 42 min    Activity Tolerance Patient tolerated treatment well    Behavior During Therapy [facility] for tasks assessed/performed               Past Medical History:  Diagnosis Date   Alcoholism in remission (HCC)    per pt in remission since 2017   Anemia    AR (allergic rhinitis)    allergist-- dr Lucie Leather   Biliary dyskinesia    Diastolic CHF (HCC)    followd by dr Demetrius Charity. [facility]ms   Environmental allergies    GERD (gastroesophageal reflux disease)    Headache    History of acute pancreatitis    alcoholic pancreatitis ,  2016 and 2017   History of sepsis 06/12/2016   with CAP   History of syncope    History of thrombosis 01/29/2015   splenic vein thrombosis-- treated with coumadin--- readmitted for abd. hematoma due to coumadin treated with IR embolization of the IMA branches   Hypertension    Polyneuropathy    Prolonged QT syndrome    cardiologist-- dr Eden Emms (notes in epic)   Severe persistent asthma    pulmonologist-- dr dr Belva Crome (notes in epic)   Past Surgical History:  Procedure Laterality Date   CHOLECYSTECTOMY N/A 06/21/2018   Procedure: LAPAROSCOPIC CHOLECYSTECTOMY;  Surgeon: Abigail Miyamoto, MD;  Location: [facility];  Service: General;  Laterality: N/A;   ESOPHAGOGASTRODUODENOSCOPY N/A 07/17/2014   Procedure: ESOPHAGOGASTRODUODENOSCOPY (EGD);  Surgeon: Dorena Cookey, MD;  Location: [facility];  Service: Endoscopy;  Laterality: N/A;   TYMPANOSTOMY TUBE PLACEMENT Bilateral 11 MONTHS OLD   VIDEO BRONCHOSCOPY Bilateral 10/08/2016    Procedure: VIDEO BRONCHOSCOPY WITH FLUORO;  Surgeon: Chilton Greathouse, MD;  Location: [facility];  Service: Cardiopulmonary;  Laterality: Bilateral;   Patient Active Problem List   Diagnosis Date Noted   Long Q-T syndrome 12/28/2021   Hypomagnesemia 12/15/2021   Chronic diarrhea 12/15/2021   Fracture of multiple ribs 12/15/2021   Anoxic brain injury (HCC) 12/09/2021   Cardiac arrest (HCC) 11/27/2021   Seizure disorder (HCC) 11/27/2021   Chronic anoxic encephalopathy (HCC) 11/27/2021   Tracheostomy status (HCC) 11/27/2021   AKI (acute kidney injury) (HCC)    Seizure (HCC)    Altered mental status    Cardiac arrest (HCC) 11/13/2021   Acute respiratory failure (HCC)    Prolonged Q-T interval on ECG    Hypokalemia    Encephalopathy    Cellulitis of right lower extremity 11/04/2021   Septic prepatellar bursitis of right knee 11/04/2021   Generalized tonic-clonic seizure (HCC) 01/20/2021   Ataxia 01/20/2021   Depression 08/05/2020   Acute on chronic pancreatitis (HCC) 11/06/2019   Elevated CK    Malnutrition of moderate degree 10/23/2019   Pancreatic pseudocyst    Pancreatic cyst    Fatty liver    Other allergic rhinitis 05/03/2019   Food allergy 05/03/2019   Tobacco smoker, less than 10 cigarettes per day 05/03/2019   Asthma, severe persistent, well-controlled 11/11/2017  Lung infiltrate    Hyponatremia 08/27/2016   Hyperglycemia 08/27/2016   Acute on chronic respiratory failure with hypoxia (HCC) 08/26/2016   HCAP (healthcare-associated pneumonia) 07/31/2016   Tobacco abuse 07/31/2016   Respiratory failure with hypoxia (HCC) 07/31/2016   CAP (community acquired pneumonia) 06/13/2016   Asthma exacerbation 06/12/2016   Alcohol abuse, in remission 06/12/2016   Sepsis (HCC) 06/12/2016   Chronic pancreatitis (HCC) 09/09/2015   Gastroesophageal reflux disease 09/09/2015   Benign essential HTN 09/09/2015   FUO (fever of unknown origin)    Abdominal hematoma 02/10/2015    Warfarin-induced coagulopathy (HCC) 02/10/2015   Hypertension 02/10/2015   Hypoalbuminemia due to protein-calorie malnutrition (HCC) 02/10/2015   Alcoholic pancreatitis 02/10/2015   Acute blood loss anemia    Intra-abdominal hematoma 02/09/2015   Pancreatitis, acute    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   Acute pancreatitis 01/29/2015   Sinus tachycardia 01/29/2015   Leukocytosis 01/29/2015   AKI (acute kidney injury) (HCC) 01/29/2015   Insomnia 10/29/2014   Polyneuropathy 09/02/2014   History of alcohol abuse 09/02/2014   Edema 09/02/2014   Neck pain 09/02/2014   Chronic lower back pain 09/02/2014   Numbness of foot 08/02/2014   Hand numbness 08/02/2014   Ataxic gait 08/02/2014   Proximal leg weakness 08/02/2014   Constipation    Abdominal pain 07/16/2014   Hypothermia 07/16/2014   Upper abdominal pain    Abdominal pain, epigastric 07/06/2014   Obesity (BMI 30-39.9) 07/06/2014   Chronic diastolic heart failure (HCC) 07/06/2014   Syncope 04/08/2014   Prolonged QT interval 04/08/2014   Hypokalemia 04/08/2014   Elevated LFTs 04/08/2014    ONSET DATE: 11/13/21  REFERRING DIAG:  G93.1 (ICD-10-CM) - Anoxic brain damage, not elsewhere classified  G40.909 (ICD-10-CM) - Epilepsy, unspecified, not intractable, without status epilepticus    THERAPY DIAG:  Other lack of coordination  Attention and concentration deficit  Muscle weakness (generalized)  Rationale for Evaluation and Treatment Rehabilitation  SUBJECTIVE:   SUBJECTIVE STATEMENT: Pt reports feeling a little run down today.  Pt accompanied by: self and family member (mother)  PERTINENT HISTORY:  HF, HLD, seizure, ETOH use, asthma, anxiety, chronic pain, long QT syndrome.   PRECAUTIONS: Fall  WEIGHT BEARING RESTRICTIONS No  PAIN:  Are you having pain? Yes: NPRS scale: 2/10 Pain location: back Pain description: soreness Aggravating factors: certain movements Relieving factors: rest  FALLS: Has patient  fallen in last 6 months? Yes. Number of falls 4  LIVING ENVIRONMENT: Lives with: lives with their spouse and mother is staying during the week while husband works Lives in: [employer] Stairs: Yes: Internal: full flight to upstairs, but full bath and bedroom on main floor - so no need to go up steps; and External: 1 step at back door, front door has 6 steps; uses back door primarily Has following equipment at home: Environmental consultant - 2 wheeled, Crutches, Grab bars, and shower stool for [employer]  PLOF: Independent, Independent with basic ADLs, Independent with household mobility without device, Independent with community mobility without device, and 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, multi-tasking, computer skills, writing letters of recommendations, phone calls  PATIENT GOALS to improve typing speed and accuracy, multi-tasking without increased stress  OBJECTIVE:   HAND DOMINANCE: Right  ADLs: Overall ADLs: Mod I, mother is in the home in case of emergency during day and spouse at night  IADLs: Shopping: utilized grocery cart for energy conservation Light housekeeping: putting away laundry Meal Prep: husband does most of the cooking/grilling so he  will shop with her  Community mobility: has gone out of the house for errands and out to eat, current not driving for ~ 6 months and was not driving before due to seizures  Medication management: mother is currently assisting due to new meds Financial management: pt was primary, however family has been doing since hospitalization  Handwriting: 100% legible  MOBILITY STATUS: Independent  POSTURE COMMENTS:  No Significant postural limitations  ACTIVITY TOLERANCE: Activity tolerance: WFL for tasks assessed on eval; does report decreased endurance  UPPER EXTREMITY ROM: WFL Bilaterally   UPPER EXTREMITY MMT:     MMT Right eval Left eval  Shoulder flexion 5/5 4+/5  Shoulder abduction    Shoulder  adduction    Shoulder extension    Shoulder internal rotation    Shoulder external rotation    Middle trapezius    Lower trapezius    Elbow flexion 5/5 4+/5  Elbow extension 5/5 4+/5  Wrist flexion    Wrist extension    Wrist ulnar deviation    Wrist radial deviation    Wrist pronation    Wrist supination    (Blank rows = not tested)  HAND FUNCTION: Grip strength: Right: 52 lbs; Left: 41 lbs  COORDINATION: 9 Hole Peg test: Right: 23.91 sec; Left: 24.57 sec  COGNITION: Overall cognitive status: Within functional limits for tasks assessed and Pt reports feeling that she needs increased time for processing especially with multi-tasking --------------------------------------------------------------------------------------------------------------------------------------------------------   TODAY'S TREATMENT:  Typing activity: with focus on coordination, speed, and accuracy.  Pt completed 1 min medium test on [email or web address] with 51 WPM and 100% accuracy.  Completed 3 min medium test with 53 WPM and 99% accuracy.  Increased challenge to completing "blind test" typing where pt typing but unable to see errors.  Pt completing 55 WPM in 1 min with 98% accuracy.  Attempted typing while therapist read passage, pt requiring increased time and pauses to increase accuracy.  Pt reports making more errors and omitting aspects of spoken passage.  Mouse accuracy: clicked 50% of stimulus, successful clicking 24/32 targets yielding 76% accuracy.  1.1 second reaction time with successful clicks.  Discussed functional carryover with precision clicking when searching course catalogue for work.  PATIENT EDUCATION: Education details: Educated on functional carryover of dual tasking and typing with various challenges to facilitate work requirements/instances. Person educated: Patient Education method: Explanation, Demonstration, and Handouts Education comprehension: verbalized understanding and needs further  education   HOME EXERCISE PROGRAM: In-hand manipulation and fine motor control handouts from [employer] websites    GOALS: Goals reviewed with patient? Yes  SHORT TERM GOALS: Target date: 01/15/22  Pt will be independent with [facility] HEP to increase coordination as needed for ADLs and vocation tasks. Baseline: Goal status: IN PROGRESS  2.  Pt will verbalize understanding of energy conservation strategies and be able to verbalize 2 strategies that she is incorporating into IADLs to increase activity tolerance. Baseline:  Goal status: IN PROGRESS  3.  Pt will demonstrate improved typing speed by 5 WPM while maintaining >95% accuracy to demonstrate increased coordination as needed for RTW. Baseline: (5 minutes): 47 wpm x 97% accuracy = 47 wpm on 12/12/21 Goal status: IN PROGRESS   LONG TERM GOALS: Target date: 02/12/22  Pt will demonstrate improved fine motor coordination for ADLs as evidenced by decreasing 9 hole peg test score for BUE by 5 secs Baseline: Right: 23.91 sec; Left: 24.57 sec Goal status: IN PROGRESS  2.  Pt will demonstrate improved typing speed  by 10 WPM while maintaining >95% accuracy to demonstrate increased coordination as needed for RTW. Baseline:  Goal status: IN PROGRESS  3.  Pt will complete table top scanning/dual tasking activity at Mod I level. Baseline:  Goal status: IN PROGRESS  4.  Pt will demonstrate and/or verbalize ability to sequence moderately challenging IADL/vocation task (banking, balancing check book/leger, taking phone messages) at Mod I level demonstrating good alternating attention. Baseline:  Goal status: IN PROGRESS  5.  Pt will verbalize understanding of RTW recommendations and recognize when able to return to work functions. Baseline:  Goal status: IN PROGRESS   ASSESSMENT:  CLINICAL IMPRESSION: Treatment session with focus on dual tasking and coordination with typing.  Pt completing typing tasks with mild improvements in  speed and with great accuracy, responding well to increased challenge. Pt reports understanding of functional carryover of alternating attention and dual tasking as needed for work tasks.    PERFORMANCE DEFICITS in functional skills including IADLs, coordination, strength, FMC, balance, body mechanics, endurance, cardiopulmonary status limiting function, and UE functional use, cognitive skills including attention, energy/drive, and sequencing, and psychosocial skills including environmental adaptation and routines and behaviors.   IMPAIRMENTS are limiting patient from IADLs, rest and sleep, and work.   COMORBIDITIES may have co-morbidities  that affects occupational performance. Patient will benefit from skilled OT to address above impairments and improve overall function.  MODIFICATION OR ASSISTANCE TO COMPLETE EVALUATION: Min-Moderate modification of tasks or assist with assess necessary to complete an evaluation.  OT OCCUPATIONAL PROFILE AND HISTORY: Detailed assessment: Review of records and additional review of physical, cognitive, psychosocial history related to current functional performance.  CLINICAL DECISION MAKING: Moderate - several treatment options, min-mod task modification necessary  REHAB POTENTIAL: Good  EVALUATION COMPLEXITY: Low    PLAN: OT FREQUENCY: 1x/week  OT DURATION: 8 weeks  PLANNED INTERVENTIONS: self care/ADL training, therapeutic exercise, therapeutic activity, balance training, functional mobility training, moist heat, cryotherapy, patient/family education, cognitive remediation/compensation, psychosocial skills training, energy conservation, coping strategies training, and DME and/or AE instructions  RECOMMENDED OTHER SERVICES: N/A  CONSULTED AND AGREED WITH PLAN OF CARE: Patient and family member/caregiver  PLAN FOR NEXT SESSION: begin typing program, dual tasking, executive functioning tasks (to include balancing check book/ledger)   Rosalio Loud,  OTR/L 12/29/2021, 11:38 AM

## 2021-12-31 ENCOUNTER — Ambulatory Visit: Payer: BC Managed Care – PPO

## 2021-12-31 DIAGNOSIS — R41841 Cognitive communication deficit: Secondary | ICD-10-CM

## 2021-12-31 DIAGNOSIS — M6281 Muscle weakness (generalized): Secondary | ICD-10-CM

## 2021-12-31 DIAGNOSIS — R2681 Unsteadiness on feet: Secondary | ICD-10-CM

## 2021-12-31 DIAGNOSIS — R2689 Other abnormalities of gait and mobility: Secondary | ICD-10-CM

## 2021-12-31 DIAGNOSIS — R278 Other lack of coordination: Secondary | ICD-10-CM | POA: Diagnosis not present

## 2021-12-31 NOTE — Therapy (Signed)
OUTPATIENT PHYSICAL THERAPY NEURO TREATMENT   Patient Name: Kara [name] MRN: [id number] DOB:02-25-1986, 36 y.o., female Today's Date: 12/31/2021   PCP: Lawerance Cruel, MD REFERRING PROVIDER: Courtney Heys, MD   PT End of Session - 12/31/21 1023     Visit Number 5    Number of Visits 13    Date for PT Re-Evaluation 01/29/22    Authorization Type BCBS    PT Start Time 1015    PT Stop Time 1100    PT Time Calculation (min) 45 min    Equipment Utilized During Treatment Gait belt    Activity Tolerance Patient tolerated treatment well    Behavior During Therapy WFL for tasks assessed/performed              Past Medical History:  Diagnosis Date   Alcoholism in remission ([place])    per pt in remission since 2017   Anemia    AR (allergic rhinitis)    allergist-- dr Neldon Mc   Biliary dyskinesia    Diastolic CHF ([place])    followd by dr Mamie Nick. Johnsie Cancel   Environmental allergies    GERD (gastroesophageal reflux disease)    Headache    History of acute pancreatitis    alcoholic pancreatitis ,  6203 and 2017   History of sepsis 06/12/2016   with CAP   History of syncope    History of thrombosis 01/29/2015   splenic vein thrombosis-- treated with coumadin--- readmitted for abd. hematoma due to coumadin treated with IR embolization of the IMA branches   Hypertension    Polyneuropathy    Prolonged QT syndrome    cardiologist-- dr Johnsie Cancel (notes in epic)   Severe persistent asthma    pulmonologist-- dr dr Bernita Raisin (notes in epic)   Past Surgical History:  Procedure Laterality Date   CHOLECYSTECTOMY N/A 06/21/2018   Procedure: LAPAROSCOPIC CHOLECYSTECTOMY;  Surgeon: Coralie Keens, MD;  Location: [facility];  Service: General;  Laterality: N/A;   ESOPHAGOGASTRODUODENOSCOPY N/A 07/17/2014   Procedure: ESOPHAGOGASTRODUODENOSCOPY (EGD);  Surgeon: Teena Irani, MD;  Location: [facility];  Service: Endoscopy;  Laterality: N/A;   TYMPANOSTOMY TUBE PLACEMENT Bilateral 11 MONTHS OLD    VIDEO BRONCHOSCOPY Bilateral 10/08/2016   Procedure: VIDEO BRONCHOSCOPY WITH FLUORO;  Surgeon: Marshell Garfinkel, MD;  Location: [facility];  Service: Cardiopulmonary;  Laterality: Bilateral;   Patient Active Problem List   Diagnosis Date Noted   Long Q-T syndrome 12/28/2021   Hypomagnesemia 12/15/2021   Chronic diarrhea 12/15/2021   Fracture of multiple ribs 12/15/2021   Anoxic brain injury ([place]) 12/09/2021   Cardiac arrest ([place]) 11/27/2021   Seizure disorder ([place]) 11/27/2021   Chronic anoxic encephalopathy ([place]) 11/27/2021   Tracheostomy status ([place]) 11/27/2021   AKI (acute kidney injury) ([place])    Seizure ([place])    Altered mental status    Cardiac arrest ([place]) 11/13/2021   Acute respiratory failure (H[facility])    Prolonged Q-T interval on ECG    Hypokalemia    Encephalopathy    Cellulitis of right lower extremity 11/04/2021   Septic prepatellar bursitis of right knee 11/04/2021   Generalized tonic-clonic seizure ([place]) 01/20/2021   Ataxia 01/20/2021   Depression 08/05/2020   Acute on chronic pancreatitis ([place]) 11/06/2019   Elevated CK    Malnutrition of moderate degree 10/23/2019   Pancreatic pseudocyst    Pancreatic cyst    Fatty liver    Other allergic rhinitis 05/03/2019   Food allergy 05/03/2019   Tobacco smoker, less than 10 cigarettes per day  05/03/2019   Asthma, severe persistent, well-controlled 11/11/2017   Lung infiltrate    Hyponatremia 08/27/2016   Hyperglycemia 08/27/2016   Acute on chronic respiratory failure with hypoxia ([place]) 08/26/2016   HCAP (healthcare-associated pneumonia) 07/31/2016   Tobacco abuse 07/31/2016   Respiratory failure with hypoxia ([place]) 07/31/2016   CAP (community acquired pneumonia) 06/13/2016   Asthma exacerbation 06/12/2016   Alcohol abuse, in remission 06/12/2016   Sepsis ([place]) 06/12/2016   Chronic pancreatitis ([place]) 09/09/2015   Gastroesophageal reflux disease 09/09/2015   Benign essential HTN 09/09/2015   FUO (fever of unknown  origin)    Abdominal hematoma 02/10/2015   Warfarin-induced coagulopathy ([place]) 02/10/2015   Hypertension 02/10/2015   Hypoalbuminemia due to protein-calorie malnutrition ([place]) 45/40/9811   Alcoholic pancreatitis 91/47/8295   Acute blood loss anemia    Intra-abdominal hematoma 02/09/2015   Pancreatitis, acute    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   Acute pancreatitis 01/29/2015   Sinus tachycardia 01/29/2015   Leukocytosis 01/29/2015   AKI (acute kidney injury) ([place]) 01/29/2015   Insomnia 10/29/2014   Polyneuropathy 09/02/2014   History of alcohol abuse 09/02/2014   Edema 09/02/2014   Neck pain 09/02/2014   Chronic lower back pain 09/02/2014   Numbness of foot 08/02/2014   Hand numbness 08/02/2014   Ataxic gait 08/02/2014   Proximal leg weakness 08/02/2014   Constipation    Abdominal pain 07/16/2014   Hypothermia 07/16/2014   Upper abdominal pain    Abdominal pain, epigastric 07/06/2014   Obesity (BMI 30-39.9) 07/06/2014   Chronic diastolic heart failure ([place]) 07/06/2014   Syncope 04/08/2014   Prolonged QT interval 04/08/2014   Hypokalemia 04/08/2014   Elevated LFTs 04/08/2014    ONSET DATE: 11/13/2021  REFERRING DIAG:  G93.1 (ICD-10-CM) - Anoxic brain damage, not elsewhere classified  G40.909 (ICD-10-CM) - Epilepsy, unspecified, not intractable, without status epilepticus    THERAPY DIAG:  Muscle weakness (generalized)  Unsteadiness on feet  Other abnormalities of gait and mobility  Rationale for Evaluation and Treatment Rehabilitation  SUBJECTIVE:                                                                                                                                                                                              SUBJECTIVE STATEMENT: Feeling pretty good, no new issues. Stamina is getting better as there is less fatigue with activities at home.   Pt accompanied by: self and family member (Mom)  PERTINENT HISTORY: Pt is a 36 y.o. female  admitted 11/13/21 with v-fib/VT cardiac arrest, pt noted to have some posturing and possible myoclonic activity; UDS negative. EEG without seizure activity. MRI concerning  for restricted diffusion in the bilateral posterior frontal, parietal, and superior occipital cortex, concerning for cytotoxic edema from hypoxic ischemic injury. ETT 7/28-8/3. PMH includes HF, HLD, seizure, ETOH use, asthma, anxiety, chronic pain, long QT syndrome.  She participated in inpatient rehab 12/09/21-12/15/21.  PAIN:  Are you having pain? Yes: NPRS scale: 2/10 Pain location: LB Pain description: sore Aggravating factors: working out, positioning Relieving factors: rest  PRECAUTIONS: Fall and Other: broken ribs from CPR  PATIENT GOALS Walking, leg strength, and balance is my main concern  OBJECTIVE:   TODAY'S TREATMENT: 12/31/21 Activity Comments  NU-step resistance intervals x 8 min 1:1 heavy:light  6" stair lift-offs 1x10 1x10 unilat 4# hold 1x10 weighted tray hold  Stair ambulation holding tray with 3# 4 laps with CGA for safety and notes difficulty in descending. Cues for ankle DF vs PF to accommodate   Walking outside  Carrying 4# dumbell, head turns, overhead db hold, walking on grass, forward/backward, walking VOR cancellation swing dumbell side-side  Standing gastroc stretch 3x45 sec on edge of step       TREATMENT 12/29/21: THEREX: Nu-step warm up x 5 minutes UEs/LEs level 5 Standing with one foot on 12" surface rocking anterior/posteriorly Step ups R and L side to 6" step with one UE x 10 reps into unilateral standing Seated Ankle DF A/ROM x 10 reps Supine trunk rocking with arms in T position x 5 reps P/ROM hamstring stretch supine AAROM hamstring stretch with str Hooklying bent knee fallouts x 10 reps  Towel stretch (perpendicular to spine) at lumbar region adding hooklying bent knee fallouts x 5 reps Supine marching x 10 reps Standing shoulder ab/adduction horizontal plane at 90 x 8 reps for  postural cues  NMR: Standing balance board ant/posterior and lateral directions dec'ing UE support, adding head motion and alternating UE reaching overhead Sidestepping squat x 8 reps R and L  GAIT: Gait with cues bilateral heelstrike at initial contact, with focus on knee extension Gait with backwards walking with CGA Tandem gait with CGA Gait with marching with alternating UEs with CGA  HOME EXERCISE PROGRAM Addended on 12/29/21 Access Code: [email or web address] URL: [email or web address] Date: 12/29/2021 Prepared by: Margretta Ditty  Exercises - Seated Heel Slide  - 1-2 x daily - 5 x weekly - 1 sets - 5 reps - 10 sec hold - Standing Gastroc Stretch on Step  - 1-2 x daily - 7 x weekly - 1 sets - 3 reps - Supine Calf Stretch with Strap  - 1 x daily - 5 x weekly - 2 sets - 10 reps - Seated Ankle Dorsiflexion AROM  - 1 x daily - 5 x weekly - 2 sets - 10 reps - Standing Single Leg Stance with Counter Support  - 1 x daily - 5 x weekly - 2 sets - 30 sec hold - Forward Step Up  - 1 x daily - 5 x weekly - 2 sets - 10 reps - Hooklying Hamstring Stretch with Strap  - 2 x daily - 7 x weekly - 1 sets - 2 reps - 30 seconds hold  PATIENT EDUCATION: Education details: HEP update Person educated: Patient Education method: Explanation, Demonstration, Tactile cues, Verbal cues, and Handouts Education comprehension: verbalized understanding and returned demonstration ____________________________________________________________________________________  TREATMENT: 12/721 Activity Comments  Nustep L5 x 6 min Ues/LEs Maintaining ~60 SPM  sitting B toe raise 10x Limited R>L  AAROM R ankle DF with strap 10x  Good control  R gastroc stretch with strap 2x30"  L/R step up/back on 6" step 5x each 1 UE support   L/R step up/back on 2" step 5x each No UE support  L/R step up/back on 6" step 5x each Good stability, limited eccentric control B  SLS 30" each More ankle instability on R  Ant/pos  monster walk with yellow TB 2x78ft CGA d/t fear of falling     HOME EXERCISE PROGRAM Last updated: 12/24/21 Access Code: [email or web address] URL: [email or web address] Date: 12/24/2021 Prepared by: [facility]  Exercises - Seated Heel Slide  - 1-2 x daily - 5 x weekly - 1 sets - 5 reps - 10 sec hold - Standing Gastroc Stretch on Step  - 1-2 x daily - 7 x weekly - 1 sets - 3 reps - Supine Calf Stretch with Strap  - 1 x daily - 5 x weekly - 2 sets - 10 reps - Seated Ankle Dorsiflexion AROM  - 1 x daily - 5 x weekly - 2 sets - 10 reps - Standing Single Leg Stance with Counter Support  - 1 x daily - 5 x weekly - 2 sets - 30 sec hold - Forward Step Up  - 1 x daily - 5 x weekly - 2 sets - 10 reps  PATIENT EDUCATION: Education details: HEP update Person educated: Patient Education method: Explanation, Demonstration, Tactile cues, Verbal cues, and Handouts Education comprehension: verbalized understanding and returned demonstration   Below measures were taken at time of initial evaluation unless otherwise specified:   DIAGNOSTIC FINDINGS: See above  COGNITION: Overall cognitive status: Within functional limits for tasks assessed   SENSATION: Light touch: WFL  POSTURE: rounded shoulders, forward head, and posterior pelvic tilt  LOWER EXTREMITY ROM:   WFL except ankle dorsiflexion:  see below  Active  Right Eval Left Eval  Hip flexion    Hip extension    Hip abduction    Hip adduction    Hip internal rotation    Hip external rotation    Knee flexion    Knee extension    Ankle dorsiflexion Active:  -4 from neutral; passive + 10 Active:  -4, passive +4  Ankle plantarflexion    Ankle inversion    Ankle eversion     (Blank rows = not tested)  LOWER EXTREMITY MMT:    MMT Right Eval Left Eval  Hip flexion 3+ 3+  Hip extension    Hip abduction 4 4  Hip adduction 4 4  Hip internal rotation    Hip external rotation    Knee  flexion 4 4  Knee extension 4 4  Ankle dorsiflexion 3- 3-  Ankle plantarflexion    Ankle inversion    Ankle eversion    (Blank rows = not tested)   TRANSFERS: Assistive device utilized: None  Sit to stand: SBA Stand to sit: SBA  STAIRS:  Level of Assistance: SBA  Stair Negotiation Technique: Alternating Pattern  with Bilateral Rails  Number of Stairs: 3   Height of Stairs: 4'-6"  Comments: Decreased foot clearance with descending steps; leads with front part of foot(decreased heelstrike on step)  GAIT: Gait pattern: step through pattern, decreased step length- Right, decreased step length- Left, decreased ankle dorsiflexion- Right, decreased ankle dorsiflexion- Left, Right steppage, Left steppage, and narrow BOS Distance walked: 40 ft x 6 reps Assistive device utilized: None Level of assistance: SBA Comments:  Mild steppage gait on level, indoor surfaces due to decreased ankle dorsiflexion strength  FUNCTIONAL TESTs:  5 times sit to stand: 15.29 sec no UE support, wide BOS Timed up and go (TUG): 11.44 sec  M-CTSIB  Condition 1: Firm Surface, EO 30 Sec, Normal Sway  Condition 2: Firm Surface, EC 30 Sec, Mild Sway  Condition 3: Foam Surface, EO 30 Sec, Mild Sway  Condition 4: Foam Surface, EC 30 Sec, Mild Sway    SLS:  R and L:  10 seconds; Trendelenburg noted on RLE Tandem:  30 seconds each foot position; Trendelenburg with RLE in posterior position. Gait velocity:  10.22 sec in 20 ft:  1.96 ft/sec FGA:  23/30 (Scores <22/30 indicate increased fall risk)     GOALS: Goals reviewed with patient? Yes  SHORT TERM GOALS: Target date: 01/15/2022  Pt will be independent with HEP for improved strength, balance, gait. Baseline: Goal status: MET  2.  Pt will improve 5x sit<>stand to less than or equal to 12.5 sec to demonstrate improved functional strength and transfer efficiency. Baseline: 15.29 sec with wide BOS Goal status: IN PROGRESS  3.  Pt will verbalize  understanding of fall prevention in home environment.  Baseline:  Goal status: IN PROGRESS  LONG TERM GOALS: Target date: 01/29/2022  Pt will be independent with HEP for improved strength, balance, transfers, and gait. Baseline:  Goal status: IN PROGRESS  2.  Pt will improve 5x sit<>stand to less than or equal to 10 sec to demonstrate improved functional strength and transfer efficiency. Baseline: 15.29 sec Goal status: IN PROGRESS  3.  Pt will improve FGA score to at least 26/30 to decrease fall risk. Baseline: 23/30 Goal status: IN PROGRESS  4.  Pt will improve gait velocity to at least 2.62 ft/sec for improved gait efficiency and safety. Baseline: 1.96 ft/sec Goal status: IN PROGRESS  ASSESSMENT:  CLINICAL IMPRESSION: Reports decrease in soreness and feeling improved activity tolerance at home as reports decrease in fatigue with ADL and housekeeping tasks. Treatment focus on improving single limb support and safety with stair ambulation with emphasis on carrying items in both hands to recreate demand of carrying laundry basket to her 2nd floor and demo good tolerance and some instances of unsteadiness with descending w/ lack of DF control requiring step-to pattern approx 50% of trials. Dynamic gait activities performed outside for busy environment with emphasis on performing head turns and negotiating obstacles as well as performing unilat carrying under various conditions.  Able to walk on flat ground/grassy surfaces with independence and no LOB experienced.  Continued sessions to progress HEP and facilitate return to normal activity level  OBJECTIVE IMPAIRMENTS Abnormal gait, decreased balance, decreased mobility, difficulty walking, decreased ROM, decreased strength, and postural dysfunction.   ACTIVITY LIMITATIONS standing, stairs, transfers, and locomotion level  PARTICIPATION LIMITATIONS: shopping, community activity, and occupation  PERSONAL FACTORS 3+ comorbidities: see  above problem list and PMH  are also affecting patient's functional outcome.   REHAB POTENTIAL: Good  CLINICAL DECISION MAKING: Evolving/moderate complexity  EVALUATION COMPLEXITY: Moderate  PLAN: PT FREQUENCY: 2x/week  PT DURATION: 6 weeks plus eval  PLANNED INTERVENTIONS: Therapeutic exercises, Therapeutic activity, Neuromuscular re-education, Balance training, Gait training, Patient/Family education, Self Care, Stair training, Orthotic/Fit training, and Manual therapy  PLAN FOR NEXT SESSION: stairs and curbs; continue to add to HEP for hip and ankle strengthening; work on ankle flexibility; compliant surface balance   11:19 AM, 12/31/21 M. Sherlyn Lees, PT, DPT Physical Therapist- [place] Office Number: [phone]   [place] [facility] [place], [place], [place]  [place] Phone # [phone] Fax # [phone]

## 2021-12-31 NOTE — Therapy (Signed)
OUTPATIENT SPEECH LANGUAGE PATHOLOGY TREATMENT NOTE   Patient Name: Graclyn Lawther MRN: [id number] DOB:1986-01-23, 36 y.o., female Today's Date: 01/01/2022  PCP: [name], MD REFERRING PROVIDER: Genice Rouge, MD    End of Session - 01/01/22 0115     Visit Number 3    Number of Visits 6    Date for SLP Re-Evaluation 01/22/22    SLP Start Time 0934    SLP Stop Time  1015    SLP Time Calculation (min) 41 min    Activity Tolerance Patient tolerated treatment well               Past Medical History:  Diagnosis Date   Alcoholism in remission (HCC)    per pt in remission since 2017   Anemia    AR (allergic rhinitis)    allergist-- dr Lucie Leather   Biliary dyskinesia    Diastolic CHF (HCC)    followd by dr Demetrius Charity. [facility]   Environmental allergies    GERD (gastroesophageal reflux disease)    Headache    History of acute pancreatitis    alcoholic pancreatitis ,  2016 and 2017   History of sepsis 06/12/2016   with CAP   History of syncope    History of thrombosis 01/29/2015   splenic vein thrombosis-- treated with coumadin--- readmitted for abd. hematoma due to coumadin treated with IR embolization of the IMA branches   Hypertension    Polyneuropathy    Prolonged QT syndrome    cardiologist-- dr Eden Emms (notes in epic)   Severe persistent asthma    pulmonologist-- dr dr Belva Crome (notes in epic)   Past Surgical History:  Procedure Laterality Date   CHOLECYSTECTOMY N/A 06/21/2018   Procedure: LAPAROSCOPIC CHOLECYSTECTOMY;  Surgeon: Abigail Miyamoto, MD;  Location: [facility];  Service: General;  Laterality: N/A;   ESOPHAGOGASTRODUODENOSCOPY N/A 07/17/2014   Procedure: ESOPHAGOGASTRODUODENOSCOPY (EGD);  Surgeon: Dorena Cookey, MD;  Location: [facility];  Service: Endoscopy;  Laterality: N/A;   TYMPANOSTOMY TUBE PLACEMENT Bilateral 11 MONTHS OLD   VIDEO BRONCHOSCOPY Bilateral 10/08/2016   Procedure: VIDEO BRONCHOSCOPY WITH FLUORO;  Surgeon: Chilton Greathouse, MD;  Location: [facility];  Service: Cardiopulmonary;  Laterality: Bilateral;   Patient Active Problem List   Diagnosis Date Noted   Long Q-T syndrome 12/28/2021   Hypomagnesemia 12/15/2021   Chronic diarrhea 12/15/2021   Fracture of multiple ribs 12/15/2021   Anoxic brain injury (HCC) 12/09/2021   Cardiac arrest (HCC) 11/27/2021   Seizure disorder (HCC) 11/27/2021   Chronic anoxic encephalopathy (HCC) 11/27/2021   Tracheostomy status (HCC) 11/27/2021   AKI (acute kidney injury) (HCC)    Seizure (HCC)    Altered mental status    Cardiac arrest (HCC) 11/13/2021   Acute respiratory failure (HCC)    Prolonged Q-T interval on ECG    Hypokalemia    Encephalopathy    Cellulitis of right lower extremity 11/04/2021   Septic prepatellar bursitis of right knee 11/04/2021   Generalized tonic-clonic seizure (HCC) 01/20/2021   Ataxia 01/20/2021   Depression 08/05/2020   Acute on chronic pancreatitis (HCC) 11/06/2019   Elevated CK    Malnutrition of moderate degree 10/23/2019   Pancreatic pseudocyst    Pancreatic cyst    Fatty liver    Other allergic rhinitis 05/03/2019   Food allergy 05/03/2019   Tobacco smoker, less than 10 cigarettes per day 05/03/2019   Asthma, severe persistent, well-controlled 11/11/2017   Lung infiltrate    Hyponatremia 08/27/2016   Hyperglycemia 08/27/2016  Acute on chronic respiratory failure with hypoxia (HCC) 08/26/2016   HCAP (healthcare-associated pneumonia) 07/31/2016   Tobacco abuse 07/31/2016   Respiratory failure with hypoxia (HCC) 07/31/2016   CAP (community acquired pneumonia) 06/13/2016   Asthma exacerbation 06/12/2016   Alcohol abuse, in remission 06/12/2016   Sepsis (HCC) 06/12/2016   Chronic pancreatitis (HCC) 09/09/2015   Gastroesophageal reflux disease 09/09/2015   Benign essential HTN 09/09/2015   FUO (fever of unknown origin)    Abdominal hematoma 02/10/2015   Warfarin-induced coagulopathy (HCC) 02/10/2015   Hypertension 02/10/2015    Hypoalbuminemia due to protein-calorie malnutrition (HCC) 02/10/2015   Alcoholic pancreatitis 02/10/2015   Acute blood loss anemia    Intra-abdominal hematoma 02/09/2015   Pancreatitis, acute    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   Acute pancreatitis 01/29/2015   Sinus tachycardia 01/29/2015   Leukocytosis 01/29/2015   AKI (acute kidney injury) (HCC) 01/29/2015   Insomnia 10/29/2014   Polyneuropathy 09/02/2014   History of alcohol abuse 09/02/2014   Edema 09/02/2014   Neck pain 09/02/2014   Chronic lower back pain 09/02/2014   Numbness of foot 08/02/2014   Hand numbness 08/02/2014   Ataxic gait 08/02/2014   Proximal leg weakness 08/02/2014   Constipation    Abdominal pain 07/16/2014   Hypothermia 07/16/2014   Upper abdominal pain    Abdominal pain, epigastric 07/06/2014   Obesity (BMI 30-39.9) 07/06/2014   Chronic diastolic heart failure (HCC) 07/06/2014   Syncope 04/08/2014   Prolonged QT interval 04/08/2014   Hypokalemia 04/08/2014   Elevated LFTs 04/08/2014    ONSET DATE: 11/04/21   REFERRING DIAG: G93.1 (ICD-10-CM) - Anoxic brain damage, not elsewhere classified G40.909 (ICD-10-CM) - Epilepsy, unspecified, not intractable, without status epilepticus   THERAPY DIAG:  Cognitive communication deficit  Rationale for Evaluation and Treatment Rehabilitation  SUBJECTIVE:   SUBJECTIVE STATEMENT: "Things with the medications are a lot better."  Pt accompanied by: self  PERTINENT HISTORY: Manuelita Moxon is a 36 year old female with history of HTN, diastolic CHF, severe asthma, alcohol abuse-quit 2017, pancreatitis on creon, seizure disorder, sensory polyneuropathy, recent septic bursitis right knee 11/04/21.  On 11/13/21, admitted after found pulseless and apneic with suspected downtime of about 20-30 minutes. Cardiology felt that  cardiac arrest was secondary to be due to VT/VF in setting of QT prolongation and electrolyte abnormality due to severe hypokalemia.  EEG showed  diffuse profound encephalopathy and MRI brain done revealing "restricted diffusion in the bilateral posterior frontal,parietal, and superior occipital cortex, concerning for cytotoxic edema from hypoxic ischemic injury." She was extubated without difficulty but had issues with agitation requiring sedation. She was reintubated due to respiratory decline, required tracheostomy 08/09 by Dr. Merrily Pew with ATC trials. Cortak placed for nutritional support 7/23.  She continued to have bouts of agitation alternating with lethargy question due to withdrawal from narcotics. She tolerated vent wean and was decannulated by 08/20. Mentation has greatly improved and she is making great gains with therapy. She continues to be limited by cognitive deficits, ataxic gait, weakness and chest wall pain. CIR recommended due to functional decline. Pt was seen by ST on CIR and d/c report 12/14/21 states "Skilled ST services focused on education and cognitive skills. Pt's husband and mother were present and engaged in education. SLP facilitated reassessment if cognitive skills administering SLUMS, scoring 27/30 (n=>27) with mild deficits in recall/divergent naming. Pt also completed subsections of formal cognitive assessment CLQT, pt scored WFL on attention, memory and executive function, with memory/executive function being towards the mid-lower  end of normal. All questions were answered to satisfaction. Pt was left in room with family, call bell within reach and bed alarm set. SLP recommends to continue skilled services."  PAIN:  Are you having pain? No   PATIENT GOALS  Improve memory, wants to type again  OBJECTIVE:   DIAGNOSTIC FINDINGS: MRI without contrast from 11-18-21 revealed:  IMPRESSION: 1. Restricted diffusion in the bilateral posterior frontal, parietal, and superior occipital cortex, concerning for cytotoxic edema from hypoxic ischemic injury. 2.  No seizure etiology identified.   TODAY'S TREATMENT:  12/31/21:  Keyshawna tells SLP she is taking brain breaks mostly for when (or if) she gets frustrated or physical fatigue, and has done this for rare mental fatigue as well. Pt recalled that she had a second "to-do"/homework for today with filing the stack of mail. Pt cannot get upstairs where the file cabinet is; She has not done it yet but will do so as soon as she is able to go upstairs.  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 She would like it to continue for that reason.  Taisia has been using a pillbox, and made folders for the different benefit/insurance plans she has accessed through state health plan. She is keeping notes on who she talks to and what the content of the conversations are. Lastly she is continuing to use the calendar for managing appointments (paper calendar and in her phone).  Pt made dinner twice this week, and reportedly went without errors and appropriate timing. Pt mentioned she thinks her processing speed is still a bit slower than prior to hospitalization. SLP told pt to listen to 10-15 minute Ted Talk or a podcast, take notes, and then tell mom or other family member about it later on that day. Pt agreed to do this.   12/23/21: Pt reported she and husband worked through his books over the weekend and pt found herself getting faster at [place] as the time progressed. By the end of the time Raley did not require any assistance from her husband. She rec'd notifications that people got their payments as directed - no errors. Husband is managing his own schedule at this time due to the incr'd medical appointments on pt's schedule. Pt is thinking of doing this permanently as he now has more autonomy with scheduling.  Pt went through the mail when she was in the hospital - paid unpaid bills. There is a stack that needs filing and she will do that before next session.  Medication - pt took PM meds in AM on Monday  due to so early in morning, pt did not differentiate dark blue and dark purple shading on med box. She is going to use a paint pen to differentiate AM from PM more distinctly. She will do this before next session.  Since Friday 12-18-21 pt is putting all appointments into a calendar (paper) which she is using to track daily appointments. No errors to date.   12/18/21: SLP discussed results, and rationale for pt's frequency of x1/week x4 weeks to allow SLP to assist with compensations. SLP discussed Constant Therapy with pt/mother via handout.   PATIENT EDUCATION: Education details: see above in "today's treatment" Person educated: Patient and Parent Education method: Explanation and Handouts Education comprehension: verbalized understanding and needs further education   GOALS: Goals reviewed with patient? Yes  SHORT TERM GOALS: Target date:  N/A     LONG TERM GOALS: Target  date: 01/22/2022    Pt will successfully utilize compensations for cognitive linguistic deficits in 3 sessions or between 3 sessions Baseline: 12-23-21, 12-31-21 Goal status: Ongoing  2.  Pt will appropriately take "brain breaks" to manage cognitive/mental fatigue in or between 3 sessions Baseline: 12-23-21, 12-30-21 Goal status: Ongoing   ASSESSMENT:  CLINICAL IMPRESSION: Patient is a 36 y.o. female who was seen today for treatment focusing on compensations for cognitive communication skills. These skills were assessed as WNL, however pt's higher level cognitive communication skills are suspected as WFL/WNL. Anshi continues to use successful compensatory measures around the home, and has expanded taking brain breaks for when she is frustrated or physically fatigued. We will keep frequency as it is currently.  OBJECTIVE IMPAIRMENTS include attention, memory, executive functioning, expressive language, and receptive language. These impairments are limiting patient from return to work, Clinical cytogeneticist finances, household  responsibilities, ADLs/IADLs, and effectively communicating at home and in community. Factors affecting potential to achieve goals and functional outcome are  none .Marland Kitchen Patient will benefit from skilled SLP services to address above impairments and improve overall function.  REHAB POTENTIAL: Excellent  PLAN: SLP FREQUENCY: 1x/week  SLP DURATION: other: 5 weeks  PLANNED INTERVENTIONS: Environmental controls, Cueing hierachy, Cognitive reorganization, Internal/external aids, Functional tasks, SLP instruction and feedback, Compensatory strategies, and Patient/family education    [facility], CCC-SLP 01/01/2022, 1:15 AM

## 2022-01-05 ENCOUNTER — Ambulatory Visit: Payer: BC Managed Care – PPO

## 2022-01-05 DIAGNOSIS — R2681 Unsteadiness on feet: Secondary | ICD-10-CM

## 2022-01-05 DIAGNOSIS — R278 Other lack of coordination: Secondary | ICD-10-CM | POA: Diagnosis not present

## 2022-01-05 DIAGNOSIS — R2689 Other abnormalities of gait and mobility: Secondary | ICD-10-CM

## 2022-01-05 DIAGNOSIS — M6281 Muscle weakness (generalized): Secondary | ICD-10-CM

## 2022-01-05 NOTE — Therapy (Signed)
OUTPATIENT PHYSICAL THERAPY NEURO TREATMENT   Patient Name: Kara [name] MRN: [id number] DOB:Mar 31, 1986, 36 y.o., female Today's Date: 01/05/2022   PCP: Lawerance Cruel, MD REFERRING PROVIDER: Courtney Heys, MD   PT End of Session - 01/05/22 0932     Visit Number 6    Number of Visits 13    Date for PT Re-Evaluation 01/29/22    Authorization Type BCBS    PT Start Time 0930    PT Stop Time 2831    PT Time Calculation (min) 45 min    Equipment Utilized During Treatment Gait belt    Activity Tolerance Patient tolerated treatment well    Behavior During Therapy [facility] for tasks assessed/performed              Past Medical History:  Diagnosis Date   Alcoholism in remission ([place])    per pt in remission since 2017   Anemia    AR (allergic rhinitis)    allergist-- dr Neldon Mc   Biliary dyskinesia    Diastolic CHF ([place])    followd by dr Mamie Nick. Johnsie Cancel   Environmental allergies    GERD (gastroesophageal reflux disease)    Headache    History of acute pancreatitis    alcoholic pancreatitis ,  5176 and 2017   History of sepsis 06/12/2016   with CAP   History of syncope    History of thrombosis 01/29/2015   splenic vein thrombosis-- treated with coumadin--- readmitted for abd. hematoma due to coumadin treated with IR embolization of the IMA branches   Hypertension    Polyneuropathy    Prolonged QT syndrome    cardiologist-- dr Johnsie Cancel (notes in epic)   Severe persistent asthma    pulmonologist-- dr dr Bernita Raisin (notes in epic)   Past Surgical History:  Procedure Laterality Date   CHOLECYSTECTOMY N/A 06/21/2018   Procedure: LAPAROSCOPIC CHOLECYSTECTOMY;  Surgeon: Coralie Keens, MD;  Location: [facility];  Service: General;  Laterality: N/A;   ESOPHAGOGASTRODUODENOSCOPY N/A 07/17/2014   Procedure: ESOPHAGOGASTRODUODENOSCOPY (EGD);  Surgeon: Teena Irani, MD;  Location: [facility];  Service: Endoscopy;  Laterality: N/A;   TYMPANOSTOMY TUBE PLACEMENT Bilateral 11 MONTHS OLD    VIDEO BRONCHOSCOPY Bilateral 10/08/2016   Procedure: VIDEO BRONCHOSCOPY WITH FLUORO;  Surgeon: Marshell Garfinkel, MD;  Location: [facility];  Service: Cardiopulmonary;  Laterality: Bilateral;   Patient Active Problem List   Diagnosis Date Noted   Long Q-T syndrome 12/28/2021   Hypomagnesemia 12/15/2021   Chronic diarrhea 12/15/2021   Fracture of multiple ribs 12/15/2021   Anoxic brain injury ([place]) 12/09/2021   Cardiac arrest ([place]) 11/27/2021   Seizure disorder ([place]) 11/27/2021   Chronic anoxic encephalopathy ([place]) 11/27/2021   Tracheostomy status ([place]) 11/27/2021   AKI (acute kidney injury) ([place])    Seizure ([place])    Altered mental status    Cardiac arrest ([place]) 11/13/2021   Acute respiratory failure ([facility])    Prolonged Q-T interval on ECG    Hypokalemia    Encephalopathy    Cellulitis of right lower extremity 11/04/2021   Septic prepatellar bursitis of right knee 11/04/2021   Generalized tonic-clonic seizure ([place]) 01/20/2021   Ataxia 01/20/2021   Depression 08/05/2020   Acute on chronic pancreatitis ([place]) 11/06/2019   Elevated CK    Malnutrition of moderate degree 10/23/2019   Pancreatic pseudocyst    Pancreatic cyst    Fatty liver    Other allergic rhinitis 05/03/2019   Food allergy 05/03/2019   Tobacco smoker, less than 10 cigarettes per day  05/03/2019   Asthma, severe persistent, well-controlled 11/11/2017   Lung infiltrate    Hyponatremia 08/27/2016   Hyperglycemia 08/27/2016   Acute on chronic respiratory failure with hypoxia ([place]) 08/26/2016   HCAP (healthcare-associated pneumonia) 07/31/2016   Tobacco abuse 07/31/2016   Respiratory failure with hypoxia ([place]) 07/31/2016   CAP (community acquired pneumonia) 06/13/2016   Asthma exacerbation 06/12/2016   Alcohol abuse, in remission 06/12/2016   Sepsis ([place]) 06/12/2016   Chronic pancreatitis ([place]) 09/09/2015   Gastroesophageal reflux disease 09/09/2015   Benign essential HTN 09/09/2015   FUO (fever of unknown  origin)    Abdominal hematoma 02/10/2015   Warfarin-induced coagulopathy ([place]) 02/10/2015   Hypertension 02/10/2015   Hypoalbuminemia due to protein-calorie malnutrition ([place]) 12/45/8099   Alcoholic pancreatitis 83/38/2505   Acute blood loss anemia    Intra-abdominal hematoma 02/09/2015   Pancreatitis, acute    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   Acute pancreatitis 01/29/2015   Sinus tachycardia 01/29/2015   Leukocytosis 01/29/2015   AKI (acute kidney injury) ([place]) 01/29/2015   Insomnia 10/29/2014   Polyneuropathy 09/02/2014   History of alcohol abuse 09/02/2014   Edema 09/02/2014   Neck pain 09/02/2014   Chronic lower back pain 09/02/2014   Numbness of foot 08/02/2014   Hand numbness 08/02/2014   Ataxic gait 08/02/2014   Proximal leg weakness 08/02/2014   Constipation    Abdominal pain 07/16/2014   Hypothermia 07/16/2014   Upper abdominal pain    Abdominal pain, epigastric 07/06/2014   Obesity (BMI 30-39.9) 07/06/2014   Chronic diastolic heart failure ([place]) 07/06/2014   Syncope 04/08/2014   Prolonged QT interval 04/08/2014   Hypokalemia 04/08/2014   Elevated LFTs 04/08/2014    ONSET DATE: 11/13/2021  REFERRING DIAG:  G93.1 (ICD-10-CM) - Anoxic brain damage, not elsewhere classified  G40.909 (ICD-10-CM) - Epilepsy, unspecified, not intractable, without status epilepticus    THERAPY DIAG:  Muscle weakness (generalized)  Unsteadiness on feet  Other abnormalities of gait and mobility  Rationale for Evaluation and Treatment Rehabilitation  SUBJECTIVE:                                                                                                                                                                                              SUBJECTIVE STATEMENT: Back is a little sore from mopping yesterday   Pt accompanied by: self and family member (Mom)  PERTINENT HISTORY: Pt is a 36 y.o. female admitted 11/13/21 with v-fib/VT cardiac arrest, pt noted to have  some posturing and possible myoclonic activity; UDS negative. EEG without seizure activity. MRI concerning for restricted diffusion in the bilateral posterior frontal, parietal, and superior  occipital cortex, concerning for cytotoxic edema from hypoxic ischemic injury. ETT 7/28-8/3. PMH includes HF, HLD, seizure, ETOH use, asthma, anxiety, chronic pain, long QT syndrome.  She participated in inpatient rehab 12/09/21-12/15/21.  PAIN:  Are you having pain? Yes: NPRS scale: 2/10 Pain location: LB Pain description: sore Aggravating factors: working out, positioning Relieving factors: rest  PRECAUTIONS: Fall and Other: broken ribs from CPR  PATIENT GOALS Walking, leg strength, and balance is my main concern  OBJECTIVE:   TODAY'S TREATMENT: 01/05/22 Activity Comments  Sit-stand w/ PF 3x10   Sit-stand w/ alt stair tap 3x10 2.5# ankle  Foot on step -EO/EC 2x10 sec -EO head turns 5x 2 trials  Agility ladder drills For BLE alt coordination w/ 2.5#          TODAY'S TREATMENT: 12/31/21 Activity Comments  NU-step resistance intervals x 8 min 1:1 heavy:light  6" stair lift-offs 1x10 1x10 unilat 4# hold 1x10 weighted tray hold  Stair ambulation holding tray with 3# 4 laps with CGA for safety and notes difficulty in descending. Cues for ankle DF vs PF to accommodate   Walking outside  Carrying 4# dumbell, head turns, overhead db hold, walking on grass, forward/backward, walking VOR cancellation swing dumbell side-side  Standing gastroc stretch 3x45 sec on edge of step         HOME EXERCISE PROGRAM Addended on 12/29/21 Access Code: [email or web address] URL: [email or web address] Date: 12/29/2021 Prepared by: Rudell Cobb  Exercises - Seated Heel Slide  - 1-2 x daily - 5 x weekly - 1 sets - 5 reps - 10 sec hold - Standing Gastroc Stretch on Step  - 1-2 x daily - 7 x weekly - 1 sets - 3 reps - Supine Calf Stretch with Strap  - 1 x daily - 5 x weekly - 2 sets - 10 reps - Seated Ankle  Dorsiflexion AROM  - 1 x daily - 5 x weekly - 2 sets - 10 reps - Standing Single Leg Stance with Counter Support  - 1 x daily - 5 x weekly - 2 sets - 30 sec hold - Forward Step Up  - 1 x daily - 5 x weekly - 2 sets - 10 reps - Hooklying Hamstring Stretch with Strap  - 2 x daily - 7 x weekly - 1 sets - 2 reps - 30 seconds hold  PATIENT EDUCATION: Education details: HEP update Person educated: Patient Education method: Explanation, Demonstration, Tactile cues, Verbal cues, and Handouts Education comprehension: verbalized understanding and returned demonstration _________________________________________________________________________   HOME EXERCISE PROGRAM Last updated: 12/24/21 Access Code: [email or web address] URL: [email or web address] Date: 12/24/2021 Prepared by: [facility]  Exercises - Seated Heel Slide  - 1-2 x daily - 5 x weekly - 1 sets - 5 reps - 10 sec hold - Standing Gastroc Stretch on Step  - 1-2 x daily - 7 x weekly - 1 sets - 3 reps - Supine Calf Stretch with Strap  - 1 x daily - 5 x weekly - 2 sets - 10 reps - Seated Ankle Dorsiflexion AROM  - 1 x daily - 5 x weekly - 2 sets - 10 reps - Standing Single Leg Stance with Counter Support  - 1 x daily - 5 x weekly - 2 sets - 30 sec hold - Forward Step Up  - 1 x daily - 5 x weekly - 2 sets - 10 reps  PATIENT EDUCATION: Education details: HEP update Person educated: Patient Education method: Explanation, Demonstration,  Tactile cues, Verbal cues, and Handouts Education comprehension: verbalized understanding and returned demonstration   Below measures were taken at time of initial evaluation unless otherwise specified:   DIAGNOSTIC FINDINGS: See above  COGNITION: Overall cognitive status: Within functional limits for tasks assessed   SENSATION: Light touch: WFL  POSTURE: rounded shoulders, forward head, and posterior pelvic tilt  LOWER EXTREMITY ROM:   WFL except ankle  dorsiflexion:  see below  Active  Right Eval Left Eval  Hip flexion    Hip extension    Hip abduction    Hip adduction    Hip internal rotation    Hip external rotation    Knee flexion    Knee extension    Ankle dorsiflexion Active:  -4 from neutral; passive + 10 Active:  -4, passive +4  Ankle plantarflexion    Ankle inversion    Ankle eversion     (Blank rows = not tested)  LOWER EXTREMITY MMT:    MMT Right Eval Left Eval  Hip flexion 3+ 3+  Hip extension    Hip abduction 4 4  Hip adduction 4 4  Hip internal rotation    Hip external rotation    Knee flexion 4 4  Knee extension 4 4  Ankle dorsiflexion 3- 3-  Ankle plantarflexion    Ankle inversion    Ankle eversion    (Blank rows = not tested)   TRANSFERS: Assistive device utilized: None  Sit to stand: SBA Stand to sit: SBA  STAIRS:  Level of Assistance: SBA  Stair Negotiation Technique: Alternating Pattern  with Bilateral Rails  Number of Stairs: 3   Height of Stairs: 4'-6"  Comments: Decreased foot clearance with descending steps; leads with front part of foot(decreased heelstrike on step)  GAIT: Gait pattern: step through pattern, decreased step length- Right, decreased step length- Left, decreased ankle dorsiflexion- Right, decreased ankle dorsiflexion- Left, Right steppage, Left steppage, and narrow BOS Distance walked: 40 ft x 6 reps Assistive device utilized: None Level of assistance: SBA Comments:  Mild steppage gait on level, indoor surfaces due to decreased ankle dorsiflexion strength  FUNCTIONAL TESTs:  5 times sit to stand: 15.29 sec no UE support, wide BOS Timed up and go (TUG): 11.44 sec  M-CTSIB  Condition 1: Firm Surface, EO 30 Sec, Normal Sway  Condition 2: Firm Surface, EC 30 Sec, Mild Sway  Condition 3: Foam Surface, EO 30 Sec, Mild Sway  Condition 4: Foam Surface, EC 30 Sec, Mild Sway    SLS:  R and L:  10 seconds; Trendelenburg noted on RLE Tandem:  30 seconds each foot  position; Trendelenburg with RLE in posterior position. Gait velocity:  10.22 sec in 20 ft:  1.96 ft/sec FGA:  23/30 (Scores <22/30 indicate increased fall risk)     GOALS: Goals reviewed with patient? Yes  SHORT TERM GOALS: Target date: 01/15/2022  Pt will be independent with HEP for improved strength, balance, gait. Baseline: Goal status: MET  2.  Pt will improve 5x sit<>stand to less than or equal to 12.5 sec to demonstrate improved functional strength and transfer efficiency. Baseline: 15.29 sec with wide BOS Goal status: IN PROGRESS  3.  Pt will verbalize understanding of fall prevention in home environment.  Baseline:  Goal status: IN PROGRESS  LONG TERM GOALS: Target date: 01/29/2022  Pt will be independent with HEP for improved strength, balance, transfers, and gait. Baseline:  Goal status: IN PROGRESS  2.  Pt will improve 5x sit<>stand to less  than or equal to 10 sec to demonstrate improved functional strength and transfer efficiency. Baseline: 15.29 sec Goal status: IN PROGRESS  3.  Pt will improve FGA score to at least 26/30 to decrease fall risk. Baseline: 23/30 Goal status: IN PROGRESS  4.  Pt will improve gait velocity to at least 2.62 ft/sec for improved gait efficiency and safety. Baseline: 1.96 ft/sec Goal status: IN PROGRESS  ASSESSMENT:  CLINICAL IMPRESSION: Emphasis on single leg balance and alternating BLE coordination w/ greatest deficits with single leg support and performing head movements w/ increased postural sway. Good performance to alternating LE movements and emphasis on agility drills with BLE. Deterioration of performance during motor multi-tasking and dual-tasking drill. Continued sessions to advance strength, balance, and coordination to facilitate return to typical work duties and community-level performance   OBJECTIVE IMPAIRMENTS Abnormal gait, decreased balance, decreased mobility, difficulty walking, decreased ROM, decreased  strength, and postural dysfunction.   ACTIVITY LIMITATIONS standing, stairs, transfers, and locomotion level  PARTICIPATION LIMITATIONS: shopping, community activity, and occupation  PERSONAL FACTORS 3+ comorbidities: see above problem list and PMH  are also affecting patient's functional outcome.   REHAB POTENTIAL: Good  CLINICAL DECISION MAKING: Evolving/moderate complexity  EVALUATION COMPLEXITY: Moderate  PLAN: PT FREQUENCY: 2x/week  PT DURATION: 6 weeks plus eval  PLANNED INTERVENTIONS: Therapeutic exercises, Therapeutic activity, Neuromuscular re-education, Balance training, Gait training, Patient/Family education, Self Care, Stair training, Orthotic/Fit training, and Manual therapy  PLAN FOR NEXT SESSION: stairs and curbs; continue to add to HEP for hip and ankle strengthening; work on ankle flexibility; compliant surface balance   9:32 AM, 01/05/22 M. Sherlyn Lees, PT, DPT Physical Therapist- [place] Office Number: [phone]   [place] [facility] [address], [place], [place] [address] Phone # [phone] Fax # [phone]

## 2022-01-06 ENCOUNTER — Other Ambulatory Visit: Payer: Self-pay | Admitting: Physical Medicine and Rehabilitation

## 2022-01-07 ENCOUNTER — Ambulatory Visit: Payer: BC Managed Care – PPO | Admitting: Physical Therapy

## 2022-01-07 ENCOUNTER — Ambulatory Visit: Payer: BC Managed Care – PPO | Admitting: Occupational Therapy

## 2022-01-07 VITALS — BP 99/70 | HR 75

## 2022-01-07 DIAGNOSIS — R278 Other lack of coordination: Secondary | ICD-10-CM

## 2022-01-07 DIAGNOSIS — R2681 Unsteadiness on feet: Secondary | ICD-10-CM

## 2022-01-07 DIAGNOSIS — R41840 Attention and concentration deficit: Secondary | ICD-10-CM

## 2022-01-07 DIAGNOSIS — M6281 Muscle weakness (generalized): Secondary | ICD-10-CM

## 2022-01-07 NOTE — Therapy (Signed)
OUTPATIENT OCCUPATIONAL THERAPY Treatment Note  Patient Name: Kara [name] MRN: [id number] DOB:01/13/1986, 36 y.o., female Today's Date: 01/07/2022  PCP: Daisy Florooss, Charles Alan, MD REFERRING PROVIDER: Genice RougeLovorn, Megan, MD        Past Medical History:  Diagnosis Date   Alcoholism in remission [facility](HCC)    per pt in remission since 2017   Anemia    AR (allergic rhinitis)    allergist-- dr Lucie Leatherkozlow   Biliary dyskinesia    Diastolic CHF (HCC)    followd by dr Demetrius Charityp. Eden Emmsnishan   Environmental allergies    GERD (gastroesophageal reflux disease)    Headache    History of acute pancreatitis    alcoholic pancreatitis ,  2016 and 2017   History of sepsis 06/12/2016   with CAP   History of syncope    History of thrombosis 01/29/2015   splenic vein thrombosis-- treated with coumadin--- readmitted for abd. hematoma due to coumadin treated with IR embolization of the IMA branches   Hypertension    Polyneuropathy    Prolonged QT syndrome    cardiologist-- dr Eden Emmsnishan (notes in epic)   Severe persistent asthma    pulmonologist-- dr dr Belva Cromep. mannam (notes in epic)   Past Surgical History:  Procedure Laterality Date   CHOLECYSTECTOMY N/A 06/21/2018   Procedure: LAPAROSCOPIC CHOLECYSTECTOMY;  Surgeon: Abigail MiyamotoBlackman, Douglas, MD;  Location: [facility];  Service: General;  Laterality: N/A;   ESOPHAGOGASTRODUODENOSCOPY N/A 07/17/2014   Procedure: ESOPHAGOGASTRODUODENOSCOPY (EGD);  Surgeon: Dorena CookeyJohn Hayes, MD;  [facility];  Service: Endoscopy;  Laterality: N/A;   TYMPANOSTOMY TUBE PLACEMENT Bilateral 11 MONTHS OLD   VIDEO BRONCHOSCOPY Bilateral 10/08/2016   Procedure: VIDEO BRONCHOSCOPY WITH FLUORO;  Surgeon: Chilton GreathouseMannam, Praveen, MD;  Location: [facility];  Service: Cardiopulmonary;  Laterality: Bilateral;   Patient Active Problem List   Diagnosis Date Noted   Long Q-T syndrome 12/28/2021   Hypomagnesemia 12/15/2021   Chronic diarrhea 12/15/2021   Fracture of multiple ribs 12/15/2021   Anoxic brain injury (HCC)  12/09/2021   Cardiac arrest (HCC) 11/27/2021   Seizure disorder (HCC) 11/27/2021   Chronic anoxic encephalopathy (HCC) 11/27/2021   Tracheostomy status (HCC) 11/27/2021   AKI (acute kidney injury) (HCC)    Seizure (HCC)    Altered mental status    Cardiac arrest (HCC) 11/13/2021   Acute respiratory failure (HCC)    Prolonged Q-T interval on ECG    Hypokalemia    Encephalopathy    Cellulitis of right lower extremity 11/04/2021   Septic prepatellar bursitis of right knee 11/04/2021   Generalized tonic-clonic seizure (HCC) 01/20/2021   Ataxia 01/20/2021   Depression 08/05/2020   Acute on chronic pancreatitis (HCC) 11/06/2019   Elevated CK    Malnutrition of moderate degree 10/23/2019   Pancreatic pseudocyst    Pancreatic cyst    Fatty liver    Other allergic rhinitis 05/03/2019   Food allergy 05/03/2019   Tobacco smoker, less than 10 cigarettes per day 05/03/2019   Asthma, severe persistent, well-controlled 11/11/2017   Lung infiltrate    Hyponatremia 08/27/2016   Hyperglycemia 08/27/2016   Acute on chronic respiratory failure with hypoxia (HCC) 08/26/2016   HCAP (healthcare-associated pneumonia) 07/31/2016   Tobacco abuse 07/31/2016   Respiratory failure with hypoxia (HCC) 07/31/2016   CAP (community acquired pneumonia) 06/13/2016   Asthma exacerbation 06/12/2016   Alcohol abuse, in remission 06/12/2016   Sepsis (HCC) 06/12/2016   Chronic pancreatitis (HCC) 09/09/2015   Gastroesophageal reflux disease 09/09/2015   Benign essential HTN 09/09/2015   FUO (  fever of unknown origin)    Abdominal hematoma 02/10/2015   Warfarin-induced coagulopathy (HCC) 02/10/2015   Hypertension 02/10/2015   Hypoalbuminemia due to protein-calorie malnutrition (HCC) 02/10/2015   Alcoholic pancreatitis 02/10/2015   Acute blood loss anemia    Intra-abdominal hematoma 02/09/2015   Pancreatitis, acute    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   Acute pancreatitis 01/29/2015   Sinus tachycardia  01/29/2015   Leukocytosis 01/29/2015   AKI (acute kidney injury) (HCC) 01/29/2015   Insomnia 10/29/2014   Polyneuropathy 09/02/2014   History of alcohol abuse 09/02/2014   Edema 09/02/2014   Neck pain 09/02/2014   Chronic lower back pain 09/02/2014   Numbness of foot 08/02/2014   Hand numbness 08/02/2014   Ataxic gait 08/02/2014   Proximal leg weakness 08/02/2014   Constipation    Abdominal pain 07/16/2014   Hypothermia 07/16/2014   Upper abdominal pain    Abdominal pain, epigastric 07/06/2014   Obesity (BMI 30-39.9) 07/06/2014   Chronic diastolic heart failure (HCC) 07/06/2014   Syncope 04/08/2014   Prolonged QT interval 04/08/2014   Hypokalemia 04/08/2014   Elevated LFTs 04/08/2014    ONSET DATE: 11/13/21  REFERRING DIAG:  G93.1 (ICD-10-CM) - Anoxic brain damage, not elsewhere classified  G40.909 (ICD-10-CM) - Epilepsy, unspecified, not intractable, without status epilepticus    THERAPY DIAG:  Other lack of coordination  Attention and concentration deficit  Muscle weakness (generalized)  Unsteadiness on feet  Rationale for Evaluation and Treatment Rehabilitation  SUBJECTIVE:   SUBJECTIVE STATEMENT: Pt reports completing some of the typing programs and reports that things seem to be improving.  Pt reports constant fatigue and notes increased numbness and mild shaking in LUE today.  Pt accompanied by: self and family member (mother)  PERTINENT HISTORY:  HF, HLD, seizure, ETOH use, asthma, anxiety, chronic pain, long QT syndrome.   PRECAUTIONS: Fall  WEIGHT BEARING RESTRICTIONS No  PAIN:  Are you having pain? No  FALLS: Has patient fallen in last 6 months? Yes. Number of falls 4  LIVING ENVIRONMENT: Lives with: lives with their spouse and mother is staying during the week while husband works Lives in: [employer] Stairs: Yes: Internal: full flight to upstairs, but full bath and bedroom on main floor - so no need to go up steps; and External: 1 step  at back door, front door has 6 steps; uses back door primarily Has following equipment at home: Environmental consultant - 2 wheeled, Crutches, Grab bars, and shower stool for [employer]  PLOF: Independent, Independent with basic ADLs, Independent with household mobility without device, Independent with community mobility without device, and 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, multi-tasking, computer skills, writing letters of recommendations, phone calls  PATIENT GOALS to improve typing speed and accuracy, multi-tasking without increased stress  OBJECTIVE:   HAND DOMINANCE: Right  ADLs: Overall ADLs: Mod I, mother is in the home in case of emergency during day and spouse at night  IADLs: Shopping: utilized grocery cart for energy conservation Light housekeeping: putting away laundry Meal Prep: husband does most of the cooking/grilling so he will shop with her  Community mobility: has gone out of the house for errands and out to eat, current not driving for ~ 6 months and was not driving before due to seizures  Medication management: mother is currently assisting due to new meds Financial management: pt was primary, however family has been doing since hospitalization  Handwriting: 100% legible  MOBILITY STATUS: Independent  POSTURE COMMENTS:  No Significant postural limitations  ACTIVITY  TOLERANCE: Activity tolerance: WFL for tasks assessed on eval; does report decreased endurance  UPPER EXTREMITY ROM: WFL Bilaterally   UPPER EXTREMITY MMT:     MMT Right eval Left eval  Shoulder flexion 5/5 4+/5  Shoulder abduction    Shoulder adduction    Shoulder extension    Shoulder internal rotation    Shoulder external rotation    Middle trapezius    Lower trapezius    Elbow flexion 5/5 4+/5  Elbow extension 5/5 4+/5  Wrist flexion    Wrist extension    Wrist ulnar deviation    Wrist radial deviation    Wrist pronation    Wrist supination    (Blank rows =  not tested)  HAND FUNCTION: Grip strength: Right: 52 lbs; Left: 41 lbs  COORDINATION: 9 Hole Peg test: Right: 23.91 sec; Left: 24.57 sec 01/07/22: Right: 20.06 sec; Left: 24.26 sec  COGNITION: Overall cognitive status: Within functional limits for tasks assessed and Pt reports feeling that she needs increased time for processing especially with multi-tasking --------------------------------------------------------------------------------------------------------------------------------------------------------   TODAY'S TREATMENT:  IADL: Pt reports engaging in some cooking activities incorporating reaching for items in overhead cabinets, carrying heavy dishes and food items, etc. Engaging in crafting activity incorporating threading, tying, and cutting fabric.   9 Hole peg test: Pt demonstrating decreased coordination on L completing in 26.5 and 22.03 sec and reporting decreased control and sensation in L hand. Coordination: Grooved Pegs with LUE for increased coordination. Pt placed pegs with one at a time and removed with one at a time progressing to completing with in-hand manipulation and translation. Pt completed with min difficulty when completing one at a time with mild increased difficulty when attempting to translate from palm to fingertips and 0 drops. Pt reports increased numbness in L hand this session and decreased coordination.  BP assessed - WNL, however on low side for pt.  Vitals:   01/07/22 0909 01/07/22 0920  BP: 97/68 99/70  Pulse: 76 75       12/29/21 Typing activity: with focus on coordination, speed, and accuracy.  Pt completed 1 min medium test on [email or web address] with 51 WPM and 100% accuracy.  Completed 3 min medium test with 53 WPM and 99% accuracy.  Increased challenge to completing "blind test" typing where pt typing but unable to see errors.  Pt completing 55 WPM in 1 min with 98% accuracy.  Attempted typing while therapist read passage, pt requiring increased  time and pauses to increase accuracy.  Pt reports making more errors and omitting aspects of spoken passage.  Mouse accuracy: clicked 27% of stimulus, successful clicking 78/24 targets yielding 76% accuracy.  1.1 second reaction time with successful clicks.  Discussed functional carryover with precision clicking when searching course catalogue for work.  PATIENT EDUCATION: Education details: Educated on functional carryover of dual tasking and typing with various challenges to facilitate work requirements/instances. Person educated: Patient Education method: Explanation, Demonstration, and Handouts Education comprehension: verbalized understanding and needs further education   HOME EXERCISE PROGRAM: In-hand manipulation and fine motor control handouts from [employer] websites    GOALS: Goals reviewed with patient? Yes  SHORT TERM GOALS: Target date: 01/15/22  Pt will be independent with [facility] to increase coordination as needed for ADLs and vocation tasks. Baseline: Goal status: IN PROGRESS  2.  Pt will verbalize understanding of energy conservation strategies and be able to verbalize 2 strategies that she is incorporating into IADLs to increase activity tolerance. Baseline:  Goal status: IN  PROGRESS  3.  Pt will demonstrate improved typing speed by 5 WPM while maintaining >95% accuracy to demonstrate increased coordination as needed for RTW. Baseline: (5 minutes): 47 wpm x 97% accuracy = 47 wpm on 12/12/21 Goal status: IN PROGRESS   LONG TERM GOALS: Target date: 02/12/22  Pt will demonstrate improved fine motor coordination for ADLs as evidenced by decreasing 9 hole peg test score for BUE by 5 secs Baseline: Right: 23.91 sec; Left: 24.57 sec Goal status: IN PROGRESS  2.  Pt will demonstrate improved typing speed by 10 WPM while maintaining >95% accuracy to demonstrate increased coordination as needed for RTW. Baseline:  Goal status: IN PROGRESS  3.  Pt will complete  table top scanning/dual tasking activity at Mod I level. Baseline:  Goal status: IN PROGRESS  4.  Pt will demonstrate and/or verbalize ability to sequence moderately challenging IADL/vocation task (banking, balancing check book/leger, taking phone messages) at Mod I level demonstrating good alternating attention. Baseline:  Goal status: IN PROGRESS  5.  Pt will verbalize understanding of RTW recommendations and recognize when able to return to work functions. Baseline:  Goal status: IN PROGRESS   ASSESSMENT:  CLINICAL IMPRESSION: Treatment session with focus on coordination and increased participation in ADLs/IADLs at home.  Pt completing coordination tasks with reports of decreased coordination and sensation in LUE.  Pt  tearful and reports not feeling well, reporting run down last week and reports still not feeling quite like herself.  Pt reports that she has not been sleeping well.  BP assessed (as above) pt with low but WNL BP.  OT provided pt with water and rest then rechecked BP.  OT discussed due to prolonged symptoms and h/o multiple medical issues recommendation to contact PCP to assess as needed.  Pt requesting to terminate remainder of session today to f/u with PCP and rest.  PERFORMANCE DEFICITS in functional skills including IADLs, coordination, strength, FMC, balance, body mechanics, endurance, cardiopulmonary status limiting function, and UE functional use, cognitive skills including attention, energy/drive, and sequencing, and psychosocial skills including environmental adaptation and routines and behaviors.   IMPAIRMENTS are limiting patient from IADLs, rest and sleep, and work.   COMORBIDITIES may have co-morbidities  that affects occupational performance. Patient will benefit from skilled OT to address above impairments and improve overall function.  MODIFICATION OR ASSISTANCE TO COMPLETE EVALUATION: Min-Moderate modification of tasks or assist with assess necessary to  complete an evaluation.  OT OCCUPATIONAL PROFILE AND HISTORY: Detailed assessment: Review of records and additional review of physical, cognitive, psychosocial history related to current functional performance.  CLINICAL DECISION MAKING: Moderate - several treatment options, min-mod task modification necessary  REHAB POTENTIAL: Good  EVALUATION COMPLEXITY: Low    PLAN: OT FREQUENCY: 1x/week  OT DURATION: 8 weeks  PLANNED INTERVENTIONS: self care/ADL training, therapeutic exercise, therapeutic activity, balance training, functional mobility training, moist heat, cryotherapy, patient/family education, cognitive remediation/compensation, psychosocial skills training, energy conservation, coping strategies training, and DME and/or AE instructions  RECOMMENDED OTHER SERVICES: N/A  CONSULTED AND AGREED WITH PLAN OF CARE: Patient and family member/caregiver  PLAN FOR NEXT SESSION: Complete 5 min timed typing test, dual tasking, executive functioning tasks (to include balancing check book/ledger)   Colon Rueth, OTR/L 01/07/2022, 9:40 AM

## 2022-01-11 ENCOUNTER — Other Ambulatory Visit: Payer: Self-pay | Admitting: Physical Medicine and Rehabilitation

## 2022-01-12 ENCOUNTER — Ambulatory Visit: Payer: BC Managed Care – PPO | Admitting: Physical Therapy

## 2022-01-12 ENCOUNTER — Ambulatory Visit: Payer: BC Managed Care – PPO

## 2022-01-12 ENCOUNTER — Encounter: Payer: Self-pay | Admitting: Physical Therapy

## 2022-01-12 DIAGNOSIS — R278 Other lack of coordination: Secondary | ICD-10-CM | POA: Diagnosis not present

## 2022-01-12 DIAGNOSIS — R2689 Other abnormalities of gait and mobility: Secondary | ICD-10-CM

## 2022-01-12 DIAGNOSIS — M6281 Muscle weakness (generalized): Secondary | ICD-10-CM

## 2022-01-12 DIAGNOSIS — R41841 Cognitive communication deficit: Secondary | ICD-10-CM

## 2022-01-12 DIAGNOSIS — R2681 Unsteadiness on feet: Secondary | ICD-10-CM

## 2022-01-12 NOTE — Therapy (Signed)
OUTPATIENT PHYSICAL THERAPY NEURO TREATMENT   Patient Name: Kara [name] MRN: [id number] DOB:01-14-1986, 36 y.o., female Today's Date: 01/12/2022   PCP: Lawerance Cruel, MD REFERRING PROVIDER: Courtney Heys, MD   PT End of Session - 01/12/22 0932     Visit Number 7    Number of Visits 13    Date for PT Re-Evaluation 01/29/22    Authorization Type BCBS    PT Start Time 0934    PT Stop Time 1015    PT Time Calculation (min) 41 min    Equipment Utilized During Treatment --    Activity Tolerance Patient tolerated treatment well    Behavior During Therapy [facility] for tasks assessed/performed               Past Medical History:  Diagnosis Date   Alcoholism in remission ([place])    per pt in remission since 2017   Anemia    AR (allergic rhinitis)    allergist-- dr Neldon Mc   Biliary dyskinesia    Diastolic CHF ([place])    followd by dr Mamie Nick. Johnsie Cancel   Environmental allergies    GERD (gastroesophageal reflux disease)    Headache    History of acute pancreatitis    alcoholic pancreatitis ,  0175 and 2017   History of sepsis 06/12/2016   with CAP   History of syncope    History of thrombosis 01/29/2015   splenic vein thrombosis-- treated with coumadin--- readmitted for abd. hematoma due to coumadin treated with IR embolization of the IMA branches   Hypertension    Polyneuropathy    Prolonged QT syndrome    cardiologist-- dr Johnsie Cancel (notes in epic)   Severe persistent asthma    pulmonologist-- dr dr Bernita Raisin (notes in epic)   Past Surgical History:  Procedure Laterality Date   CHOLECYSTECTOMY N/A 06/21/2018   Procedure: LAPAROSCOPIC CHOLECYSTECTOMY;  Surgeon: Coralie Keens, MD;  Location: [facility];  Service: General;  Laterality: N/A;   ESOPHAGOGASTRODUODENOSCOPY N/A 07/17/2014   Procedure: ESOPHAGOGASTRODUODENOSCOPY (EGD);  Surgeon: Teena Irani, MD;  Location: [facility];  Service: Endoscopy;  Laterality: N/A;   TYMPANOSTOMY TUBE PLACEMENT Bilateral 11 MONTHS OLD   VIDEO  BRONCHOSCOPY Bilateral 10/08/2016   Procedure: VIDEO BRONCHOSCOPY WITH FLUORO;  Surgeon: Marshell Garfinkel, MD;  Location: [facility];  Service: Cardiopulmonary;  Laterality: Bilateral;   Patient Active Problem List   Diagnosis Date Noted   Long Q-T syndrome 12/28/2021   Hypomagnesemia 12/15/2021   Chronic diarrhea 12/15/2021   Fracture of multiple ribs 12/15/2021   Anoxic brain injury ([place] ) 12/09/2021   Cardiac arrest ([place]) 11/27/2021   Seizure disorder ([place]) 11/27/2021   Chronic anoxic encephalopathy ([place]) 11/27/2021   Tracheostomy status ([place]) 11/27/2021   AKI (acute kidney injury) ([place])    Seizure ([place])    Altered mental status    Cardiac arrest ([place]) 11/13/2021   Acute respiratory failure ([facility])    Prolonged Q-T interval on ECG    Hypokalemia    Encephalopathy    Cellulitis of right lower extremity 11/04/2021   Septic prepatellar bursitis of right knee 11/04/2021   Generalized tonic-clonic seizure ([place]) 01/20/2021   Ataxia 01/20/2021   Depression 08/05/2020   Acute on chronic pancreatitis ([place]) 11/06/2019   Elevated CK    Malnutrition of moderate degree 10/23/2019   Pancreatic pseudocyst    Pancreatic cyst    Fatty liver    Other allergic rhinitis 05/03/2019   Food allergy 05/03/2019   Tobacco smoker, less than 10 cigarettes per day  05/03/2019   Asthma, severe persistent, well-controlled 11/11/2017   Lung infiltrate    Hyponatremia 08/27/2016   Hyperglycemia 08/27/2016   Acute on chronic respiratory failure with hypoxia ([place]) 08/26/2016   HCAP (healthcare-associated pneumonia) 07/31/2016   Tobacco abuse 07/31/2016   Respiratory failure with hypoxia ([place]) 07/31/2016   CAP (community acquired pneumonia) 06/13/2016   Asthma exacerbation 06/12/2016   Alcohol abuse, in remission 06/12/2016   Sepsis ([place]) 06/12/2016   Chronic pancreatitis ([place]) 09/09/2015   Gastroesophageal reflux disease 09/09/2015   Benign essential HTN 09/09/2015   FUO (fever of unknown origin)     Abdominal hematoma 02/10/2015   Warfarin-induced coagulopathy ([place]) 02/10/2015   Hypertension 02/10/2015   Hypoalbuminemia due to protein-calorie malnutrition ([place]) 56/38/7564   Alcoholic pancreatitis 33/29/5188   Acute blood loss anemia    Intra-abdominal hematoma 02/09/2015   Pancreatitis, acute    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   Acute pancreatitis 01/29/2015   Sinus tachycardia 01/29/2015   Leukocytosis 01/29/2015   AKI (acute kidney injury) ([place]) 01/29/2015   Insomnia 10/29/2014   Polyneuropathy 09/02/2014   History of alcohol abuse 09/02/2014   Edema 09/02/2014   Neck pain 09/02/2014   Chronic lower back pain 09/02/2014   Numbness of foot 08/02/2014   Hand numbness 08/02/2014   Ataxic gait 08/02/2014   Proximal leg weakness 08/02/2014   Constipation    Abdominal pain 07/16/2014   Hypothermia 07/16/2014   Upper abdominal pain    Abdominal pain, epigastric 07/06/2014   Obesity (BMI 30-39.9) 07/06/2014   Chronic diastolic heart failure ([place]) 07/06/2014   Syncope 04/08/2014   Prolonged QT interval 04/08/2014   Hypokalemia 04/08/2014   Elevated LFTs 04/08/2014    ONSET DATE: 11/13/2021  REFERRING DIAG:  G93.1 (ICD-10-CM) - Anoxic brain damage, not elsewhere classified  G40.909 (ICD-10-CM) - Epilepsy, unspecified, not intractable, without status epilepticus    THERAPY DIAG:  Unsteadiness on feet  Other abnormalities of gait and mobility  Muscle weakness (generalized)  Rationale for Evaluation and Treatment Rehabilitation  SUBJECTIVE:                                                                                                                                                                                              SUBJECTIVE STATEMENT: Wasn't feeling well last week, with my BP low.  Was an "off"-day, but feel better.  Did the flight of steps yesterday at home-used the rail, and I was able to go up and down with step through pattern with husband's  supervision.  Proud of myself!  Pt accompanied by: self and family member (Mom)  PERTINENT HISTORY: Pt is a  36 y.o. female admitted 11/13/21 with v-fib/VT cardiac arrest, pt noted to have some posturing and possible myoclonic activity; UDS negative. EEG without seizure activity. MRI concerning for restricted diffusion in the bilateral posterior frontal, parietal, and superior occipital cortex, concerning for cytotoxic edema from hypoxic ischemic injury. ETT 7/28-8/3. PMH includes HF, HLD, seizure, ETOH use, asthma, anxiety, chronic pain, long QT syndrome.  She participated in inpatient rehab 12/09/21-12/15/21.  PAIN:  No   PRECAUTIONS: Fall and Other: broken ribs from CPR  PATIENT GOALS Walking, leg strength, and balance is my main concern  OBJECTIVE:    TODAY'S TREATMENT: 01/12/2022 Activity Comments  Vitals:  101/67; HR 75   NuStep, Level 4>5, 4 extremities x 6 minutes for lower extremity strengthening Keeps pace >90 SPM  Gait 300 ft in gym with turns, no LOB, independently Improved heelstrike with gait  5x sit<>stand 11.65 sec Improved from 15.29 sec  Standing wide BOS ant/posterior weightshift through hips for gastroc, hamstring, hip flexor stretch, 5 x 15 sec   Minisquats to up on toes, 2 x 10 reps at counter Cues for keeping heel on ground with squats  Alt heel raises x 10 reps   Monster walk, no resistance at counter, forward/back x2 reps    Access Code: [email or web address] URL: [email or web address] Date: 01/12/2022-most recent updates Prepared by: [facility]  Exercises - Seated Heel Slide  - 1-2 x daily - 5 x weekly - 1 sets - 5 reps - 10 sec hold - Standing Gastroc Stretch on Step  - 1-2 x daily - 7 x weekly - 1 sets - 3 reps - Supine Calf Stretch with Strap  - 1 x daily - 5 x weekly - 2 sets - 10 reps - Seated Ankle Dorsiflexion AROM  - 1 x daily - 5 x weekly - 2 sets - 10 reps - Standing Single Leg Stance with Counter Support  - 1  x daily - 5 x weekly - 2 sets - 30 sec hold - Forward Step Up  - 1 x daily - 5 x weekly - 2 sets - 10 reps - Hooklying Hamstring Stretch with Strap  - 2 x daily - 7 x weekly - 1 sets - 2 reps - 30 seconds hold - Standing Lumbar Spine Flexion Stretch Counter  - 2 x daily - 7 x weekly - 1 sets - 5-10 reps - 15 sec hold  PATIENT EDUCATION: Education details: Fall prevention education provided, HEP update Person educated: Patient Education method: Explanation, Demonstration, and Handouts Education comprehension: verbalized understanding        _________________________________________________________________________   Below measures were taken at time of initial evaluation unless otherwise specified:   DIAGNOSTIC FINDINGS: See above  COGNITION: Overall cognitive status: Within functional limits for tasks assessed   SENSATION: Light touch: WFL  POSTURE: rounded shoulders, forward head, and posterior pelvic tilt  LOWER EXTREMITY ROM:   WFL except ankle dorsiflexion:  see below  Active  Right Eval Left Eval  Hip flexion    Hip extension    Hip abduction    Hip adduction    Hip internal rotation    Hip external rotation    Knee flexion    Knee extension    Ankle dorsiflexion Active:  -4 from neutral; passive + 10 Active:  -4, passive +4  Ankle plantarflexion    Ankle inversion    Ankle eversion     (Blank rows = not tested)  LOWER EXTREMITY MMT:  MMT Right Eval Left Eval  Hip flexion 3+ 3+  Hip extension    Hip abduction 4 4  Hip adduction 4 4  Hip internal rotation    Hip external rotation    Knee flexion 4 4  Knee extension 4 4  Ankle dorsiflexion 3- 3-  Ankle plantarflexion    Ankle inversion    Ankle eversion    (Blank rows = not tested)   TRANSFERS: Assistive device utilized: None  Sit to stand: SBA Stand to sit: SBA  STAIRS:  Level of Assistance: SBA  Stair Negotiation Technique: Alternating Pattern  with Bilateral Rails  Number of  Stairs: 3   Height of Stairs: 4'-6"  Comments: Decreased foot clearance with descending steps; leads with front part of foot(decreased heelstrike on step)  GAIT: Gait pattern: step through pattern, decreased step length- Right, decreased step length- Left, decreased ankle dorsiflexion- Right, decreased ankle dorsiflexion- Left, Right steppage, Left steppage, and narrow BOS Distance walked: 40 ft x 6 reps Assistive device utilized: None Level of assistance: SBA Comments:  Mild steppage gait on level, indoor surfaces due to decreased ankle dorsiflexion strength  FUNCTIONAL TESTs:  5 times sit to stand: 15.29 sec no UE support, wide BOS Timed up and go (TUG): 11.44 sec  M-CTSIB  Condition 1: Firm Surface, EO 30 Sec, Normal Sway  Condition 2: Firm Surface, EC 30 Sec, Mild Sway  Condition 3: Foam Surface, EO 30 Sec, Mild Sway  Condition 4: Foam Surface, EC 30 Sec, Mild Sway    SLS:  R and L:  10 seconds; Trendelenburg noted on RLE Tandem:  30 seconds each foot position; Trendelenburg with RLE in posterior position. Gait velocity:  10.22 sec in 20 ft:  1.96 ft/sec FGA:  23/30 (Scores <22/30 indicate increased fall risk)     GOALS: Goals reviewed with patient? Yes  SHORT TERM GOALS: Target date: 01/15/2022  Pt will be independent with HEP for improved strength, balance, gait. Baseline: Goal status: MET  2.  Pt will improve 5x sit<>stand to less than or equal to 12.5 sec to demonstrate improved functional strength and transfer efficiency. Baseline: 15.29 sec with wide BOS> 11.65 sec 01/12/2022 Goal status: GOAL MET  3.  Pt will verbalize understanding of fall prevention in home environment.  Baseline:  Goal status: GOAL MET  LONG TERM GOALS: Target date: 01/29/2022  Pt will be independent with HEP for improved strength, balance, transfers, and gait. Baseline:  Goal status: IN PROGRESS  2.  Pt will improve 5x sit<>stand to less than or equal to 10 sec to demonstrate improved  functional strength and transfer efficiency. Baseline: 15.29 sec Goal status: IN PROGRESS  3.  Pt will improve FGA score to at least 26/30 to decrease fall risk. Baseline: 23/30 Goal status: IN PROGRESS  4.  Pt will improve gait velocity to at least 2.62 ft/sec for improved gait efficiency and safety. Baseline: 1.96 ft/sec Goal status: IN PROGRESS  ASSESSMENT:  CLINICAL IMPRESSION: Assessed STGs this visit, with pt meeting 3 of 3 LTGs.  She is demonstrating improved functional strength with improved FTSTS test from >15 sec to 11.65 sec.  Continued to work on lower extremity strength, with pt demonstrating fatigue, but improving with repetition.  She will continue to benefit from skilled PT towards LTGs for improved functional mobility and balance for return to independent PLOF.  OBJECTIVE IMPAIRMENTS Abnormal gait, decreased balance, decreased mobility, difficulty walking, decreased ROM, decreased strength, and postural dysfunction.   ACTIVITY LIMITATIONS standing, stairs,  transfers, and locomotion level  PARTICIPATION LIMITATIONS: shopping, community activity, and occupation  PERSONAL FACTORS 3+ comorbidities: see above problem list and PMH  are also affecting patient's functional outcome.   REHAB POTENTIAL: Good  CLINICAL DECISION MAKING: Evolving/moderate complexity  EVALUATION COMPLEXITY: Moderate  PLAN: PT FREQUENCY: 2x/week  PT DURATION: 6 weeks plus eval  PLANNED INTERVENTIONS: Therapeutic exercises, Therapeutic activity, Neuromuscular re-education, Balance training, Gait training, Patient/Family education, Self Care, Stair training, Orthotic/Fit training, and Manual therapy  PLAN FOR NEXT SESSION: Progress HEP for balance; stairs and curbs; continue to add to HEP for hip and ankle strengthening; work on ankle flexibility; compliant surface balance   Mady Haagensen, PT 01/12/22 12:10 PM Phone: [phone] Fax: [place] at  [facility] [address] [place], [place] [address] Phone # [phone] Fax # [phone]

## 2022-01-12 NOTE — Therapy (Signed)
OUTPATIENT SPEECH LANGUAGE PATHOLOGY TREATMENT NOTE/DISCHARGE SUMMARY   Patient Name: Kara [name] MRN: [id number] DOB:Sep 09, 1985, 36 y.o., female Today's Date: 01/12/2022  PCP: Lawerance Cruel, MD REFERRING PROVIDER: Courtney Heys, MD    End of Session - 01/12/22 2354     Visit Number 4    Number of Visits 6    Date for SLP Re-Evaluation 01/22/22    SLP Start Time 37    SLP Stop Time  1042    SLP Time Calculation (min) 26 min    Activity Tolerance Patient tolerated treatment well                Past Medical History:  Diagnosis Date   Alcoholism in remission ([place])    per pt in remission since 2017   Anemia    AR (allergic rhinitis)    allergist-- dr Neldon Mc   Biliary dyskinesia    Diastolic CHF ([place])    followd by dr Mamie Nick. Johnsie Cancel   Environmental allergies    GERD (gastroesophageal reflux disease)    Headache    History of acute pancreatitis    alcoholic pancreatitis ,  6440 and 2017   History of sepsis 06/12/2016   with CAP   History of syncope    History of thrombosis 01/29/2015   splenic vein thrombosis-- treated with coumadin--- readmitted for abd. hematoma due to coumadin treated with IR embolization of the IMA branches   Hypertension    Polyneuropathy    Prolonged QT syndrome    cardiologist-- dr Johnsie Cancel (notes in epic)   Severe persistent asthma    pulmonologist-- dr dr Bernita Raisin (notes in epic)   Past Surgical History:  Procedure Laterality Date   CHOLECYSTECTOMY N/A 06/21/2018   Procedure: LAPAROSCOPIC CHOLECYSTECTOMY;  Surgeon: Coralie Keens, MD;  Location: [facility];  Service: General;  Laterality: N/A;   ESOPHAGOGASTRODUODENOSCOPY N/A 07/17/2014   Procedure: ESOPHAGOGASTRODUODENOSCOPY (EGD);  Surgeon: Teena Irani, MD;  Location: [facility];  Service: Endoscopy;  Laterality: N/A;   TYMPANOSTOMY TUBE PLACEMENT Bilateral 11 MONTHS OLD   VIDEO BRONCHOSCOPY Bilateral 10/08/2016   Procedure: VIDEO BRONCHOSCOPY WITH FLUORO;  Surgeon: Marshell Garfinkel,  MD;  Location: [facility];  Service: Cardiopulmonary;  Laterality: Bilateral;   Patient Active Problem List   Diagnosis Date Noted   Long Q-T syndrome 12/28/2021   Hypomagnesemia 12/15/2021   Chronic diarrhea 12/15/2021   Fracture of multiple ribs 12/15/2021   Anoxic brain injury ([place]) 12/09/2021   Cardiac arrest ([place]) 11/27/2021   Seizure disorder ([place]) 11/27/2021   Chronic anoxic encephalopathy ([place]) 11/27/2021   Tracheostomy status ([place]) 11/27/2021   AKI (acute kidney injury) ([place])    Seizure ([place])    Altered mental status    Cardiac arrest ([place]) 11/13/2021   Acute respiratory failure ([facility])    Prolonged Q-T interval on ECG    Hypokalemia    Encephalopathy    Cellulitis of right lower extremity 11/04/2021   Septic prepatellar bursitis of right knee 11/04/2021   Generalized tonic-clonic seizure ([place]) 01/20/2021   Ataxia 01/20/2021   Depression 08/05/2020   Acute on chronic pancreatitis ([place]) 11/06/2019   Elevated CK    Malnutrition of moderate degree 10/23/2019   Pancreatic pseudocyst    Pancreatic cyst    Fatty liver    Other allergic rhinitis 05/03/2019   Food allergy 05/03/2019   Tobacco smoker, less than 10 cigarettes per day 05/03/2019   Asthma, severe persistent, well-controlled 11/11/2017   Lung infiltrate    Hyponatremia 08/27/2016   Hyperglycemia 08/27/2016  Acute on chronic respiratory failure with hypoxia ([place]) 08/26/2016   HCAP (healthcare-associated pneumonia) 07/31/2016   Tobacco abuse 07/31/2016   Respiratory failure with hypoxia ([place]) 07/31/2016   CAP (community acquired pneumonia) 06/13/2016   Asthma exacerbation 06/12/2016   Alcohol abuse, in remission 06/12/2016   Sepsis ([place]) 06/12/2016   Chronic pancreatitis ([place]) 09/09/2015   Gastroesophageal reflux disease 09/09/2015   Benign essential HTN 09/09/2015   FUO (fever of unknown origin)    Abdominal hematoma 02/10/2015   Warfarin-induced coagulopathy ([place]) 02/10/2015   Hypertension 02/10/2015    Hypoalbuminemia due to protein-calorie malnutrition ([place]) 48/04/6551   Alcoholic pancreatitis 74/82/7078   Acute blood loss anemia    Intra-abdominal hematoma 02/09/2015   Pancreatitis, acute    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   Acute pancreatitis 01/29/2015   Sinus tachycardia 01/29/2015   Leukocytosis 01/29/2015   AKI (acute kidney injury) ([place]) 01/29/2015   Insomnia 10/29/2014   Polyneuropathy 09/02/2014   History of alcohol abuse 09/02/2014   Edema 09/02/2014   Neck pain 09/02/2014   Chronic lower back pain 09/02/2014   Numbness of foot 08/02/2014   Hand numbness 08/02/2014   Ataxic gait 08/02/2014   Proximal leg weakness 08/02/2014   Constipation    Abdominal pain 07/16/2014   Hypothermia 07/16/2014   Upper abdominal pain    Abdominal pain, epigastric 07/06/2014   Obesity (BMI 30-39.9) 07/06/2014   Chronic diastolic heart failure ([place]) 07/06/2014   Syncope 04/08/2014   Prolonged QT interval 04/08/2014   Hypokalemia 04/08/2014   Elevated LFTs 04/08/2014    SPEECH THERAPY DISCHARGE SUMMARY  Visits from Start of Care: 4  Current functional level related to goals / functional outcomes: See below.    Remaining deficits: Minimal/mild memory deficits which pt demonstrates/reports successful compensation.   Education / Equipment: Compensations for cognitive linguistics, return to work.   Patient agrees to discharge. Patient goals were met. Patient is being discharged due to meeting the stated rehab goals..     ONSET DATE: 11/04/21   REFERRING DIAG: G93.1 (ICD-10-CM) - Anoxic brain damage, not elsewhere classified G40.909 (ICD-10-CM) - Epilepsy, unspecified, not intractable, without status epilepticus   THERAPY DIAG:  Cognitive communication deficit  Rationale for Evaluation and Treatment Rehabilitation  SUBJECTIVE:   SUBJECTIVE STATEMENT: "I'm feeling more confident - for sure."  Pt accompanied by: self  PERTINENT HISTORY: Kara [name] is a 36 year  old female with history of HTN, diastolic CHF, severe asthma, alcohol abuse-quit 2017, pancreatitis on creon, seizure disorder, sensory polyneuropathy, recent septic bursitis right knee 11/04/21.  On 11/13/21, admitted after found pulseless and apneic with suspected downtime of about 20-30 minutes. Cardiology felt that  cardiac arrest was secondary to be due to VT/VF in setting of QT prolongation and electrolyte abnormality due to severe hypokalemia.  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 She was reintubated due to respiratory decline, required tracheostomy 08/09 by Dr. Tacy Learn with ATC trials. Cortak placed for nutritional support 7/23.  She continued to have bouts of agitation alternating with lethargy question due to withdrawal from narcotics. She tolerated vent wean and was decannulated by 08/20. Mentation has greatly improved and she is making great gains with therapy. She continues to be limited by cognitive deficits, ataxic gait, weakness and chest wall pain. CIR recommended due to functional decline. Pt  was seen by ST on CIR and d/c report 12/14/21 states "Skilled ST services focused on education and cognitive skills. Pt's husband and mother were present and engaged in education. SLP facilitated reassessment if cognitive skills administering SLUMS, scoring 27/30 (n=>27) with mild deficits in recall/divergent naming. Pt also completed subsections of formal cognitive assessment CLQT, pt scored WFL on attention, memory and executive function, with memory/executive function being towards the mid-lower end of normal. All questions were answered to satisfaction. Pt was left in room with family, call bell within reach and bed alarm set. SLP  recommends to continue skilled services."  PAIN:  Are you having pain? No   PATIENT GOALS  Improve memory, wants to type again  OBJECTIVE:   DIAGNOSTIC FINDINGS: MRI without contrast from 11-18-21 revealed:  IMPRESSION: 1. Restricted diffusion in the bilateral posterior frontal, parietal, and superior occipital cortex, concerning for cytotoxic edema from hypoxic ischemic injury. 2.  No seizure etiology identified.   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   12/31/21: Estill Bamberg tells SLP she is taking brain breaks mostly for when (or if) she gets frustrated or physical fatigue, and has done this for rare mental fatigue as well. Pt recalled that she had a second "to-do"/homework for today with filing the stack of mail. Pt cannot get upstairs where the file cabinet is; She has not done it yet but will do so as soon as she is able to go upstairs.  Husband continues to schedule his appointments and Kyana continues to think this is a better arrangement for his business. She would like it to continue for that reason.  Elliette has been using a pillbox, and made folders for the different benefit/insurance plans she has accessed through state health plan. She is keeping notes on who she talks to and what the content of the conversations are. Lastly she is continuing to use the calendar for managing appointments (paper calendar and in her phone).  Pt made dinner twice this week, and reportedly went without errors and appropriate timing. Pt mentioned she thinks her processing speed is still a bit slower than  prior to hospitalization. SLP told pt to listen to 10-15 minute Ted Talk or a podcast, take notes, and then tell mom or other family member about it later on that day. Pt agreed to do this.   12/23/21: Pt reported she and husband worked through his books over the weekend and pt found herself getting faster at [place] as the time progressed. By the end of the time Cyla did not require any assistance from her husband. She rec'd notifications that people got their payments as directed - no errors. Husband is managing his own schedule at this time due to the incr'd medical appointments on pt's schedule. Pt is thinking of doing this permanently as he now has more autonomy with scheduling.  Pt went through the mail when she was in the hospital - paid unpaid bills. There is a stack that needs filing and she will do that before next session.  Medication - pt took PM meds in AM on Monday due to so early in morning, pt did not differentiate dark blue and  dark purple shading on med box. She is going to use a paint pen to differentiate AM from PM more distinctly. She will do this before next session.  Since Friday 12-18-21 pt is putting all appointments into a calendar (paper) which she is using to track daily appointments. No errors to date.   12/18/21: SLP discussed results, and rationale for pt's frequency of x1/week x4 weeks to allow SLP to assist with compensations. SLP discussed [facility] Therapy with pt/mother via handout.   PATIENT EDUCATION: Education details: see above in "today's treatment" Person educated: Patient and Parent Education method: Explanation and Handouts Education comprehension: verbalized understanding and needs further education   GOALS: Goals reviewed with patient? Yes  SHORT TERM GOALS: Target date:  N/A     LONG TERM GOALS: Target date: 01/22/2022    Pt will successfully utilize compensations for cognitive linguistic deficits in 3 sessions or between 3 sessions Baseline:  12-23-21, 12-31-21 Goal status: Met  2.  Pt will appropriately take "brain breaks" to manage cognitive/mental fatigue in or between 3 sessions Baseline: 12-23-21, 12-30-21 Goal status: Met   ASSESSMENT:  CLINICAL IMPRESSION: Patient is a 36 y.o. female who was seen today for treatment focusing on compensations for cognitive communication skills. These skills were assessed as WNL, however pt's higher level cognitive communication skills are suspected as WFL/WNL. Prudence continues to use successful compensatory measures around the home, and has expanded taking brain breaks for when she is frustrated or physically fatigued. We will keep frequency as it is currently.  OBJECTIVE IMPAIRMENTS include attention, memory, executive functioning, expressive language, and receptive language. These impairments are limiting patient from return to work, Armed forces technical officer finances, household responsibilities, ADLs/IADLs, and effectively communicating at home and in community. Factors affecting potential to achieve goals and functional outcome are  none .Marland Kitchen Patient will benefit from skilled SLP services to address above impairments and improve overall function.  REHAB POTENTIAL: Excellent  PLAN: SLP FREQUENCY: 1x/week  SLP DURATION: other: 5 weeks  PLANNED INTERVENTIONS: Environmental controls, Cueing hierachy, Cognitive reorganization, Internal/external aids, Functional tasks, SLP instruction and feedback, Compensatory strategies, and Patient/family education    [facility], [place] 01/12/2022, 11:55 PM

## 2022-01-14 ENCOUNTER — Ambulatory Visit: Payer: BC Managed Care – PPO | Admitting: Occupational Therapy

## 2022-01-14 ENCOUNTER — Encounter: Payer: Self-pay | Admitting: Physical Therapy

## 2022-01-14 ENCOUNTER — Ambulatory Visit: Payer: BC Managed Care – PPO | Admitting: Physical Therapy

## 2022-01-14 DIAGNOSIS — R41840 Attention and concentration deficit: Secondary | ICD-10-CM

## 2022-01-14 DIAGNOSIS — R278 Other lack of coordination: Secondary | ICD-10-CM | POA: Diagnosis not present

## 2022-01-14 DIAGNOSIS — F419 Anxiety disorder, unspecified: Secondary | ICD-10-CM

## 2022-01-14 DIAGNOSIS — R2681 Unsteadiness on feet: Secondary | ICD-10-CM

## 2022-01-14 DIAGNOSIS — G931 Anoxic brain damage, not elsewhere classified: Secondary | ICD-10-CM

## 2022-01-14 DIAGNOSIS — M6281 Muscle weakness (generalized): Secondary | ICD-10-CM

## 2022-01-14 DIAGNOSIS — R2689 Other abnormalities of gait and mobility: Secondary | ICD-10-CM

## 2022-01-14 NOTE — Therapy (Signed)
OUTPATIENT OCCUPATIONAL THERAPY Treatment Note  Patient Name: Kara [name] MRN: [id number] DOB:07-23-1985, 36 y.o., female Today's Date: 01/14/2022  PCP: Lawerance Cruel, MD REFERRING PROVIDER: Courtney Heys, MD    OT End of Session - 01/14/22 0945     Visit Number 5    Number of Visits 9    Date for OT Re-Evaluation 02/12/22    Authorization Type BC[employer]    OT Start Time 0933    OT Stop Time 2902    OT Time Calculation (min) 42 min    Activity Tolerance Patient tolerated treatment well    Behavior During Therapy [facility] for tasks assessed/performed                Past Medical History:  Diagnosis Date   Alcoholism in remission ([place])    per pt in remission since 2017   Anemia    AR (allergic rhinitis)    allergist-- dr Neldon Mc   Biliary dyskinesia    Diastolic CHF ([place])    followd by dr Mamie Nick. Johnsie Cancel   Environmental allergies    GERD (gastroesophageal reflux disease)    Headache    History of acute pancreatitis    alcoholic pancreatitis ,  1115 and 2017   History of sepsis 06/12/2016   with CAP   History of syncope    History of thrombosis 01/29/2015   splenic vein thrombosis-- treated with coumadin--- readmitted for abd. hematoma due to coumadin treated with IR embolization of the IMA branches   Hypertension    Polyneuropathy    Prolonged QT syndrome    cardiologist-- dr Johnsie Cancel (notes in epic)   Severe persistent asthma    pulmonologist-- dr dr Bernita Raisin (notes in epic)   Past Surgical History:  Procedure Laterality Date   CHOLECYSTECTOMY N/A 06/21/2018   Procedure: LAPAROSCOPIC CHOLECYSTECTOMY;  Surgeon: Coralie Keens, MD;  Location: [facility];  Service: General;  Laterality: N/A;   ESOPHAGOGASTRODUODENOSCOPY N/A 07/17/2014   Procedure: ESOPHAGOGASTRODUODENOSCOPY (EGD);  Surgeon: Teena Irani, MD;  Location: [facility];  Service: Endoscopy;  Laterality: N/A;   TYMPANOSTOMY TUBE PLACEMENT Bilateral 11 MONTHS OLD   VIDEO BRONCHOSCOPY Bilateral 10/08/2016    Procedure: VIDEO BRONCHOSCOPY WITH FLUORO;  Surgeon: Marshell Garfinkel, MD;  Location: [place];  Service: Cardiopulmonary;  Laterality: Bilateral;   Patient Active Problem List   Diagnosis Date Noted   Long Q-T syndrome 12/28/2021   Hypomagnesemia 12/15/2021   Chronic diarrhea 12/15/2021   Fracture of multiple ribs 12/15/2021   Anoxic brain injury ([place]) 12/09/2021   Cardiac arrest ([place]) 11/27/2021   Seizure disorder ([place]) 11/27/2021   Chronic anoxic encephalopathy ([place]) 11/27/2021   Tracheostomy status ([place]) 11/27/2021   AKI (acute kidney injury) ([place])    Seizure ([place])    Altered mental status    Cardiac arrest ([place]) 11/13/2021   Acute respiratory failure ([facility])    Prolonged Q-T interval on ECG    Hypokalemia    Encephalopathy    Cellulitis of right lower extremity 11/04/2021   Septic prepatellar bursitis of right knee 11/04/2021   Generalized tonic-clonic seizure ([place]) 01/20/2021   Ataxia 01/20/2021   Depression 08/05/2020   Acute on chronic pancreatitis ([place]) 11/06/2019   Elevated CK    Malnutrition of moderate degree 10/23/2019   Pancreatic pseudocyst    Pancreatic cyst    Fatty liver    Other allergic rhinitis 05/03/2019   Food allergy 05/03/2019   Tobacco smoker, less than 10 cigarettes per day 05/03/2019   Asthma, severe persistent, well-controlled 11/11/2017  Lung infiltrate    Hyponatremia 08/27/2016   Hyperglycemia 08/27/2016   Acute on chronic respiratory failure with hypoxia ([place]) 08/26/2016   HCAP (healthcare-associated pneumonia) 07/31/2016   Tobacco abuse 07/31/2016   Respiratory failure with hypoxia ([place]) 07/31/2016   CAP (community acquired pneumonia) 06/13/2016   Asthma exacerbation 06/12/2016   Alcohol abuse, in remission 06/12/2016   Sepsis ([place]) 06/12/2016   Chronic pancreatitis (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   Acute pancreatitis 01/29/2015   Sinus tachycardia 01/29/2015   Leukocytosis 01/29/2015   AKI (acute kidney injury) ([place]) 01/29/2015   Insomnia 10/29/2014   Polyneuropathy 09/02/2014   History of alcohol abuse 09/02/2014   Edema 09/02/2014   Neck pain 09/02/2014   Chronic lower back pain 09/02/2014   Numbness of foot 08/02/2014   Hand numbness 08/02/2014   Ataxic gait 08/02/2014   Proximal leg weakness 08/02/2014   Constipation    Abdominal pain 07/16/2014   Hypothermia 07/16/2014   Upper abdominal pain    Abdominal pain, epigastric 07/06/2014   Obesity (BMI 30-39.9) 07/06/2014   Chronic diastolic heart failure ([place]) 07/06/2014   Syncope 04/08/2014   Prolonged QT interval 04/08/2014   Hypokalemia 04/08/2014   Elevated LFTs 04/08/2014    ONSET DATE: 11/13/21  REFERRING DIAG:  G93.1 (ICD-10-CM) - Anoxic brain damage, not elsewhere classified  G40.909 (ICD-10-CM) - Epilepsy, unspecified, not intractable, without status epilepticus    THERAPY DIAG:  Other lack of coordination  Attention and concentration deficit  Muscle weakness (generalized)  Unsteadiness on feet  Rationale for Evaluation and Treatment Rehabilitation  SUBJECTIVE:   SUBJECTIVE STATEMENT: Pt reports feeling much better today and reporting discharged from Speech Therapy.  Pt accompanied by: self and family member (mother)  PERTINENT HISTORY:  HF, HLD, seizure, ETOH use, asthma, anxiety, chronic pain, long QT syndrome.   PRECAUTIONS: Fall  WEIGHT BEARING RESTRICTIONS No  PAIN:  Are you having pain? No  FALLS: Has patient fallen in last 6 months? Yes. Number of falls 4  LIVING  ENVIRONMENT: Lives with: lives with their spouse and mother is staying during the week while husband works Lives in: [employer] Stairs: Yes: Internal: full flight to upstairs, but full bath and bedroom on main floor - so no need to go up steps; and External: 1 step at back door, front door has 6 steps; uses back door primarily Has following equipment at home: Environmental consultant - 2 wheeled, Crutches, Grab bars, and shower stool for [employer]  PLOF: Independent, Independent with basic ADLs, Independent with household mobility without device, Independent with community mobility without device, and 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, multi-tasking, computer skills, writing letters of recommendations, phone calls  PATIENT GOALS to improve typing speed and accuracy, multi-tasking without increased stress  OBJECTIVE:   HAND DOMINANCE: Right  ADLs: Overall ADLs: Mod I, mother is in the home in case of emergency during day and spouse at night  IADLs: Shopping: utilized grocery cart for energy conservation Light housekeeping: putting away laundry Meal Prep: husband does most of the cooking/grilling so he will shop with her  Community mobility: has  gone out of the house for errands and out to eat, current not driving for ~ 6 months and was not driving before due to seizures  Medication management: mother is currently assisting due to new meds Financial management: pt was primary, however family has been doing since hospitalization  Handwriting: 100% legible  MOBILITY STATUS: Independent  POSTURE COMMENTS:  No Significant postural limitations  ACTIVITY TOLERANCE: Activity tolerance: WFL for tasks assessed on eval; does report decreased endurance  UPPER EXTREMITY ROM: WFL Bilaterally   UPPER EXTREMITY MMT:     MMT Right eval Left eval  Shoulder flexion 5/5 4+/5  Shoulder abduction    Shoulder adduction    Shoulder extension    Shoulder internal rotation     Shoulder external rotation    Middle trapezius    Lower trapezius    Elbow flexion 5/5 4+/5  Elbow extension 5/5 4+/5  Wrist flexion    Wrist extension    Wrist ulnar deviation    Wrist radial deviation    Wrist pronation    Wrist supination    (Blank rows = not tested)  HAND FUNCTION: Grip strength: Right: 52 lbs; Left: 41 lbs  COORDINATION: 9 Hole Peg test: Right: 23.91 sec; Left: 24.57 sec 01/07/22: Right: 20.06 sec; Left: 24.26 sec  COGNITION: Overall cognitive status: Within functional limits for tasks assessed and Pt reports feeling that she needs increased time for processing especially with multi-tasking --------------------------------------------------------------------------------------------------------------------------------------------------------   TODAY'S TREATMENT:  Typing:  Completed 5 min of practice typing following by timed 5 min typing program.  Pt demonstrating improved ease and speed with typing, however program shut down prior to completion of typing test therefore unable to get results.   Categorization: provided with list of items and directed to place in categories according to where she would purchase items.  Task facilitating handwriting, dual tasking, and thinking skills of organization and sequencing. Discussed ability to purchase majority at a large store such as [employer], but able to organize into section of the store. OT then challenged pt to estimate how much money she would spend at each store. Pt able to complete grocery portion, stating "no idea" on how much some of the other items would cost. Planning/Organizing: engaged in complex planning/problem solving prompt requiring pt to organize items into systematic schedule to complete all in time allotted.  Discussed pacing and prioritizing activities to ensure enough energy to complete tasks.  Discussed use of online ordering, curb side pickup, and use of digital resources to assist as  needed.    01/07/22 IADL: Pt reports engaging in some cooking activities incorporating reaching for items in overhead cabinets, carrying heavy dishes and food items, etc. Engaging in crafting activity incorporating threading, tying, and cutting fabric.   9 Hole peg test: Pt demonstrating decreased coordination on L completing in 26.5 and 22.03 sec and reporting decreased control and sensation in L hand. Coordination: Grooved Pegs with LUE for increased coordination. Pt placed pegs with one at a time and removed with one at a time progressing to completing with in-hand manipulation and translation. Pt completed with min difficulty when completing one at a time with mild increased difficulty when attempting to translate from palm to fingertips and 0 drops. Pt reports increased numbness in L hand this session and decreased coordination.  BP assessed - WNL, however on low side for pt.     12/29/21 Typing activity: with focus on coordination, speed, and accuracy.  Pt completed 1 min medium test on [email or web address] with  51 WPM and 100% accuracy.  Completed 3 min medium test with 53 WPM and 99% accuracy.  Increased challenge to completing "blind test" typing where pt typing but unable to see errors.  Pt completing 55 WPM in 1 min with 98% accuracy.  Attempted typing while therapist read passage, pt requiring increased time and pauses to increase accuracy.  Pt reports making more errors and omitting aspects of spoken passage.  Mouse accuracy: clicked 63% of stimulus, successful clicking 81/77 targets yielding 76% accuracy.  1.1 second reaction time with successful clicks.  Discussed functional carryover with precision clicking when searching course catalogue for work.  PATIENT EDUCATION: Education details: Educated on functional carryover of categorization and planning activity while reiterating energy conservation strategies. Person educated: Patient Education method: Explanation, Demonstration, and  Handouts Education comprehension: verbalized understanding and needs further education   HOME EXERCISE PROGRAM: In-hand manipulation and fine motor control handouts from [place] [employer] websites    GOALS: Goals reviewed with patient? Yes  SHORT TERM GOALS: Target date: 01/15/22  Pt will be independent with [facility] to increase coordination as needed for ADLs and vocation tasks. Baseline: Goal status: MET - 01/14/22  2.  Pt will verbalize understanding of energy conservation strategies and be able to verbalize 2 strategies that she is incorporating into IADLs to increase activity tolerance. Baseline:  Goal status: MET - 01/14/22  3.  Pt will demonstrate improved typing speed by 5 WPM while maintaining >95% accuracy to demonstrate increased coordination as needed for RTW. Baseline: (5 minutes): 47 wpm x 97% accuracy = 47 wpm on 12/12/21 Goal status: NOT MET - unable to assess on 9/28 due to program shutting down during typing task   LONG TERM GOALS: Target date: 02/12/22  Pt will demonstrate improved fine motor coordination for ADLs as evidenced by decreasing 9 hole peg test score for BUE by 5 secs Baseline: Right: 23.91 sec; Left: 24.57 sec Goal status: IN PROGRESS  2.  Pt will demonstrate improved typing speed by 10 WPM while maintaining >95% accuracy to demonstrate increased coordination as needed for RTW. Baseline:  Goal status: IN PROGRESS  3.  Pt will complete table top scanning/dual tasking activity at Mod I level. Baseline:  Goal status: IN PROGRESS  4.  Pt will demonstrate and/or verbalize ability to sequence moderately challenging IADL/vocation task (banking, balancing check book/leger, taking phone messages) at Mod I level demonstrating good alternating attention. Baseline:  Goal status: IN PROGRESS  5.  Pt will verbalize understanding of RTW recommendations and recognize when able to return to work functions. Baseline:  Goal status: IN  PROGRESS   ASSESSMENT:  CLINICAL IMPRESSION: Treatment session with focus on coordination and cognitive dual tasking as needed for increased engagement in ADLs/IADLs and return to work.  Pt demonstrating improved coordination with typing, but unable to assess time due to program shutting down mid assessment.  Pt requiring min increased time with categorization and planning tasks, appreciative of adaptive strategies and recommendation of use of technology for assist as needed.    PERFORMANCE DEFICITS in functional skills including IADLs, coordination, strength, FMC, balance, body mechanics, endurance, cardiopulmonary status limiting function, and UE functional use, cognitive skills including attention, energy/drive, and sequencing, and psychosocial skills including environmental adaptation and routines and behaviors.   IMPAIRMENTS are limiting patient from IADLs, rest and sleep, and work.   COMORBIDITIES may have co-morbidities  that affects occupational performance. Patient will benefit from skilled OT to address above impairments and improve overall function.  MODIFICATION OR  ASSISTANCE TO COMPLETE EVALUATION: Min-Moderate modification of tasks or assist with assess necessary to complete an evaluation.  OT OCCUPATIONAL PROFILE AND HISTORY: Detailed assessment: Review of records and additional review of physical, cognitive, psychosocial history related to current functional performance.  CLINICAL DECISION MAKING: Moderate - several treatment options, min-mod task modification necessary  REHAB POTENTIAL: Good  EVALUATION COMPLEXITY: Low    PLAN: OT FREQUENCY: 1x/week  OT DURATION: 8 weeks  PLANNED INTERVENTIONS: self care/ADL training, therapeutic exercise, therapeutic activity, balance training, functional mobility training, moist heat, cryotherapy, patient/family education, cognitive remediation/compensation, psychosocial skills training, energy conservation, coping strategies  training, and DME and/or AE instructions  RECOMMENDED OTHER SERVICES: N/A  CONSULTED AND AGREED WITH PLAN OF CARE: Patient and family member/caregiver  PLAN FOR NEXT SESSION: Complete 5 min timed typing test, dual tasking, executive functioning tasks (to include balancing check book/ledger)   Hadden Steig, [place], OTR/L 01/14/2022, 9:45 AM

## 2022-01-14 NOTE — Therapy (Signed)
OUTPATIENT PHYSICAL THERAPY NEURO TREATMENT   Patient Name: Kara [name] MRN: [id number] DOB:15-Jan-1986, 36 y.o., female Today's Date: 01/14/2022   PCP: Lawerance Cruel, MD REFERRING PROVIDER: Courtney Heys, MD   PT End of Session - 01/14/22 1008     Visit Number 8    Number of Visits 13    Date for PT Re-Evaluation 01/29/22    Authorization Type BCBS    PT Start Time 1018    PT Stop Time 1059    PT Time Calculation (min) 41 min    Equipment Utilized During Treatment Gait belt    Activity Tolerance Patient tolerated treatment well    Behavior During Therapy WFL for tasks assessed/performed                Past Medical History:  Diagnosis Date   Alcoholism in remission ([place])    per pt in remission since 2017   Anemia    AR (allergic rhinitis)    allergist-- dr Neldon Mc   Biliary dyskinesia    Diastolic CHF ([place])    followd by dr Mamie Nick. Johnsie Cancel   Environmental allergies    GERD (gastroesophageal reflux disease)    Headache    History of acute pancreatitis    alcoholic pancreatitis ,  4098 and 2017   History of sepsis 06/12/2016   with CAP   History of syncope    History of thrombosis 01/29/2015   splenic vein thrombosis-- treated with coumadin--- readmitted for abd. hematoma due to coumadin treated with IR embolization of the IMA branches   Hypertension    Polyneuropathy    Prolonged QT syndrome    cardiologist-- dr Johnsie Cancel (notes in epic)   Severe persistent asthma    pulmonologist-- dr dr Bernita Raisin (notes in epic)   Past Surgical History:  Procedure Laterality Date   CHOLECYSTECTOMY N/A 06/21/2018   Procedure: LAPAROSCOPIC CHOLECYSTECTOMY;  Surgeon: Coralie Keens, MD;  Location: [facility];  Service: General;  Laterality: N/A;   ESOPHAGOGASTRODUODENOSCOPY N/A 07/17/2014   Procedure: ESOPHAGOGASTRODUODENOSCOPY (EGD);  Surgeon: Teena Irani, MD;  Location: [facility];  Service: Endoscopy;  Laterality: N/A;   TYMPANOSTOMY TUBE PLACEMENT Bilateral 11 MONTHS OLD    VIDEO BRONCHOSCOPY Bilateral 10/08/2016   Procedure: VIDEO BRONCHOSCOPY WITH FLUORO;  Surgeon: Marshell Garfinkel, MD;  Location: [facility];  Service: Cardiopulmonary;  Laterality: Bilateral;   Patient Active Problem List   Diagnosis Date Noted   Long Q-T syndrome 12/28/2021   Hypomagnesemia 12/15/2021   Chronic diarrhea 12/15/2021   Fracture of multiple ribs 12/15/2021   Anoxic brain injury ([place]) 12/09/2021   Cardiac arrest ([place]) 11/27/2021   Seizure disorder ([place]) 11/27/2021   Chronic anoxic encephalopathy ([place]) 11/27/2021   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/27/2021   AKI (acute kidney injury) ([place])    Seizure ([place])    Altered mental status    Cardiac arrest ([place]) 11/13/2021   Acute respiratory failure ([facility])    Prolonged Q-T interval on ECG    Hypokalemia    Encephalopathy    Cellulitis of right lower extremity 11/04/2021   Septic prepatellar bursitis of right knee 11/04/2021   Generalized tonic-clonic seizure ([place]) 01/20/2021   Ataxia 01/20/2021   Depression 08/05/2020   Acute on chronic pancreatitis ([place]) 11/06/2019   Elevated CK    Malnutrition of moderate degree 10/23/2019   Pancreatic pseudocyst    Pancreatic cyst    Fatty liver    Other allergic rhinitis 05/03/2019   Food allergy 05/03/2019   Tobacco smoker, less than 10 cigarettes  per day 05/03/2019   Asthma, severe persistent, well-controlled 11/11/2017   Lung infiltrate    Hyponatremia 08/27/2016   Hyperglycemia 08/27/2016   Acute on chronic respiratory failure with hypoxia ([place]) 08/26/2016   HCAP (healthcare-associated pneumonia) 07/31/2016   Tobacco abuse 07/31/2016   Respiratory failure with hypoxia ([place]) 07/31/2016   CAP (community acquired pneumonia) 06/13/2016   Asthma exacerbation 06/12/2016   Alcohol abuse, in remission 06/12/2016   Sepsis ([place]) 06/12/2016   Chronic pancreatitis ([place]) 09/09/2015   Gastroesophageal reflux disease 09/09/2015   Benign essential HTN 09/09/2015   FUO (fever of unknown  origin)    Abdominal hematoma 02/10/2015   Warfarin-induced coagulopathy ([place]) 02/10/2015   Hypertension 02/10/2015   Hypoalbuminemia due to protein-calorie malnutrition ([place]) 51/05/5850   Alcoholic pancreatitis 77/82/4235   Acute blood loss anemia    Intra-abdominal hematoma 02/09/2015   Pancreatitis, acute    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   Acute pancreatitis 01/29/2015   Sinus tachycardia 01/29/2015   Leukocytosis 01/29/2015   AKI (acute kidney injury) ([place]) 01/29/2015   Insomnia 10/29/2014   Polyneuropathy 09/02/2014   History of alcohol abuse 09/02/2014   Edema 09/02/2014   Neck pain 09/02/2014   Chronic lower back pain 09/02/2014   Numbness of foot 08/02/2014   Hand numbness 08/02/2014   Ataxic gait 08/02/2014   Proximal leg weakness 08/02/2014   Constipation    Abdominal pain 07/16/2014   Hypothermia 07/16/2014   Upper abdominal pain    Abdominal pain, epigastric 07/06/2014   Obesity (BMI 30-39.9) 07/06/2014   Chronic diastolic heart failure ([place]) 07/06/2014   Syncope 04/08/2014   Prolonged QT interval 04/08/2014   Hypokalemia 04/08/2014   Elevated LFTs 04/08/2014    ONSET DATE: 11/13/2021  REFERRING DIAG:  G93.1 (ICD-10-CM) - Anoxic brain damage, not elsewhere classified  G40.909 (ICD-10-CM) - Epilepsy, unspecified, not intractable, without status epilepticus    THERAPY DIAG:  Unsteadiness on feet  Other abnormalities of gait and mobility  Muscle weakness (generalized)  Rationale for Evaluation and Treatment Rehabilitation  SUBJECTIVE:                                                                                                                                                                                              SUBJECTIVE STATEMENT: Nothing new today.  Continuing to do the steps with rail and step through pattern.  Pt accompanied by: self and family member (Mom)  PERTINENT HISTORY: Pt is a 36 y.o. female admitted 11/13/21 with v-fib/VT  cardiac arrest, pt noted to have some posturing and possible myoclonic activity; UDS negative. EEG without seizure activity. MRI concerning for restricted diffusion  in the bilateral posterior frontal, parietal, and superior occipital cortex, concerning for cytotoxic edema from hypoxic ischemic injury. ETT 7/28-8/3. PMH includes HF, HLD, seizure, ETOH use, asthma, anxiety, chronic pain, long QT syndrome.  She participated in inpatient rehab 12/09/21-12/15/21.  PAIN:  No   PRECAUTIONS: Fall and Other: broken ribs from CPR  PATIENT GOALS Walking, leg strength, and balance is my main concern  OBJECTIVE:   TODAY'S TREATMENT: 01/14/2022 Activity Comments  Sit<>stand 10 reps then 2 x 5 reps standing on foam, from mat surface (2nd set with UE support from mat) Increased difficulty with standing on foam  Alt step taps to 4", then 8" step, 10 reps  Intermittent UE support  Single limb step ups to 6" step, 10 reps   Forward step down, initially from 6" step x 3 reps; then performed x 5 reps each leg from 4" step Difficulty return back up to step at 6"; better able to performa t 4" step  Standing wide BOS ant/posterior weightshift through hips for gastroc, hamstring, hip flexor stretch, 5 x 15 sec Review of HEP from last visit, pt return demo understanding  Sidestepping along counter, 3 reps no resistance, 3 reps with yellow band   Resisted monster walk forward/back at counter with yellow band   TAndem gait forward/back>marching forward/back>tandem march forward/back, 2-3 reps UE support  On Airex:  feet apart/together, 5 reps head turns/nods, then EC 30 sec; Marching in place Intermittent UE support   Access Code: [email or web address] URL: [email or web address] Date: 01/14/2022-most recent update Prepared by: [facility]  Exercises - Seated Heel Slide  - 1-2 x daily - 5 x weekly - 1 sets - 5 reps - 10 sec hold - Standing Gastroc Stretch on Step  - 1-2 x daily -  7 x weekly - 1 sets - 3 reps - Supine Calf Stretch with Strap  - 1 x daily - 5 x weekly - 2 sets - 10 reps - Seated Ankle Dorsiflexion AROM  - 1 x daily - 5 x weekly - 2 sets - 10 reps - Standing Single Leg Stance with Counter Support  - 1 x daily - 5 x weekly - 2 sets - 30 sec hold - Forward Step Up  - 1 x daily - 5 x weekly - 2 sets - 10 reps - Hooklying Hamstring Stretch with Strap  - 2 x daily - 7 x weekly - 1 sets - 2 reps - 30 seconds hold - Standing Lumbar Spine Flexion Stretch Counter  - 2 x daily - 7 x weekly - 1 sets - 5-10 reps - 15 sec hold - Side Stepping with Resistance at Ankles and Counter Support  - 1 x daily - 5 x weekly - 1 sets - 3-5 reps - Forward and Backward Monster Walk with Resistance at Ankles and Counter Support  - 1 x daily - 5 x weekly - 1 sets - 3-5 reps     PATIENT EDUCATION: Education details: HEP additions Person educated: Patient Education method: Consulting civil engineer, Demonstration, and Handouts Education comprehension: verbalized understanding and returned demonstration        _________________________________________________________________________   Below measures were taken at time of initial evaluation unless otherwise specified:   DIAGNOSTIC FINDINGS: See above  COGNITION: Overall cognitive status: Within functional limits for tasks assessed   SENSATION: Light touch: WFL  POSTURE: rounded shoulders, forward head, and posterior pelvic tilt  LOWER EXTREMITY ROM:   WFL except ankle dorsiflexion:  see  below  Active  Right Eval Left Eval  Hip flexion    Hip extension    Hip abduction    Hip adduction    Hip internal rotation    Hip external rotation    Knee flexion    Knee extension    Ankle dorsiflexion Active:  -4 from neutral; passive + 10 Active:  -4, passive +4  Ankle plantarflexion    Ankle inversion    Ankle eversion     (Blank rows = not tested)  LOWER EXTREMITY MMT:    MMT Right Eval Left Eval  Hip flexion 3+ 3+  Hip  extension    Hip abduction 4 4  Hip adduction 4 4  Hip internal rotation    Hip external rotation    Knee flexion 4 4  Knee extension 4 4  Ankle dorsiflexion 3- 3-  Ankle plantarflexion    Ankle inversion    Ankle eversion    (Blank rows = not tested)   TRANSFERS: Assistive device utilized: None  Sit to stand: SBA Stand to sit: SBA  STAIRS:  Level of Assistance: SBA  Stair Negotiation Technique: Alternating Pattern  with Bilateral Rails  Number of Stairs: 3   Height of Stairs: 4'-6"  Comments: Decreased foot clearance with descending steps; leads with front part of foot(decreased heelstrike on step)  GAIT: Gait pattern: step through pattern, decreased step length- Right, decreased step length- Left, decreased ankle dorsiflexion- Right, decreased ankle dorsiflexion- Left, Right steppage, Left steppage, and narrow BOS Distance walked: 40 ft x 6 reps Assistive device utilized: None Level of assistance: SBA Comments:  Mild steppage gait on level, indoor surfaces due to decreased ankle dorsiflexion strength  FUNCTIONAL TESTs:  5 times sit to stand: 15.29 sec no UE support, wide BOS Timed up and go (TUG): 11.44 sec  M-CTSIB  Condition 1: Firm Surface, EO 30 Sec, Normal Sway  Condition 2: Firm Surface, EC 30 Sec, Mild Sway  Condition 3: Foam Surface, EO 30 Sec, Mild Sway  Condition 4: Foam Surface, EC 30 Sec, Mild Sway    SLS:  R and L:  10 seconds; Trendelenburg noted on RLE Tandem:  30 seconds each foot position; Trendelenburg with RLE in posterior position. Gait velocity:  10.22 sec in 20 ft:  1.96 ft/sec FGA:  23/30 (Scores <22/30 indicate increased fall risk)     GOALS: Goals reviewed with patient? Yes  SHORT TERM GOALS: Target date: 01/15/2022  Pt will be independent with HEP for improved strength, balance, gait. Baseline: Goal status: MET  2.  Pt will improve 5x sit<>stand to less than or equal to 12.5 sec to demonstrate improved functional strength and  transfer efficiency. Baseline: 15.29 sec with wide BOS> 11.65 sec 01/12/2022 Goal status: GOAL MET  3.  Pt will verbalize understanding of fall prevention in home environment.  Baseline:  Goal status: GOAL MET  LONG TERM GOALS: Target date: 01/29/2022  Pt will be independent with HEP for improved strength, balance, transfers, and gait. Baseline:  Goal status: IN PROGRESS  2.  Pt will improve 5x sit<>stand to less than or equal to 10 sec to demonstrate improved functional strength and transfer efficiency. Baseline: 15.29 sec Goal status: IN PROGRESS  3.  Pt will improve FGA score to at least 26/30 to decrease fall risk. Baseline: 23/30 Goal status: IN PROGRESS  4.  Pt will improve gait velocity to at least 2.62 ft/sec for improved gait efficiency and safety. Baseline: 1.96 ft/sec Goal status: IN PROGRESS  ASSESSMENT:  CLINICAL IMPRESSION: Focused skilled PT session today on functional strength, static balance on compliant surface, and dynamic balance activities.  Lower extremities fatigue easily with step downs from 6" step, more easily able to do from 4" step.  With compliant surface balance work, she utilizes UE support for improved stability.  She will continue to benefit from skilled PT towards goals for improved functional mobility and decreased fall risk.    OBJECTIVE IMPAIRMENTS Abnormal gait, decreased balance, decreased mobility, difficulty walking, decreased ROM, decreased strength, and postural dysfunction.   ACTIVITY LIMITATIONS standing, stairs, transfers, and locomotion level  PARTICIPATION LIMITATIONS: shopping, community activity, and occupation  PERSONAL FACTORS 3+ comorbidities: see above problem list and PMH  are also affecting patient's functional outcome.   REHAB POTENTIAL: Good  CLINICAL DECISION MAKING: Evolving/moderate complexity  EVALUATION COMPLEXITY: Moderate  PLAN: PT FREQUENCY: 2x/week  PT DURATION: 6 weeks plus eval  PLANNED  INTERVENTIONS: Therapeutic exercises, Therapeutic activity, Neuromuscular re-education, Balance training, Gait training, Patient/Family education, Self Care, Stair training, Orthotic/Fit training, and Manual therapy  PLAN FOR NEXT SESSION: Review updates to HEP; continue to work on  balance; stairs and curbs; work on ankle flexibility; compliant surface balance   Mady Haagensen, PT 01/14/22 11:04 AM Phone: [phone] Fax: [place] at [facility] [address] [place], [place] [address] Phone # [phone] Fax # [phone]

## 2022-01-18 ENCOUNTER — Encounter: Payer: Self-pay | Admitting: Physical Medicine and Rehabilitation

## 2022-01-18 ENCOUNTER — Encounter
Payer: BC Managed Care – PPO | Attending: Physical Medicine and Rehabilitation | Admitting: Physical Medicine and Rehabilitation

## 2022-01-18 VITALS — BP 123/85 | HR 79 | Ht 65.0 in | Wt 193.6 lb

## 2022-01-18 DIAGNOSIS — R600 Localized edema: Secondary | ICD-10-CM | POA: Insufficient documentation

## 2022-01-18 DIAGNOSIS — M21372 Foot drop, left foot: Secondary | ICD-10-CM | POA: Insufficient documentation

## 2022-01-18 DIAGNOSIS — I469 Cardiac arrest, cause unspecified: Secondary | ICD-10-CM | POA: Diagnosis present

## 2022-01-18 DIAGNOSIS — F411 Generalized anxiety disorder: Secondary | ICD-10-CM | POA: Diagnosis present

## 2022-01-18 DIAGNOSIS — G931 Anoxic brain damage, not elsewhere classified: Secondary | ICD-10-CM | POA: Insufficient documentation

## 2022-01-18 DIAGNOSIS — I4581 Long QT syndrome: Secondary | ICD-10-CM | POA: Insufficient documentation

## 2022-01-18 MED ORDER — BUSPIRONE HCL 5 MG PO TABS: 5.0000 mg | ORAL_TABLET | Freq: Three times a day (TID) | ORAL | 0 refills | Status: AC | PRN
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
## 2022-01-18 NOTE — Patient Instructions (Addendum)
Pt is a 36 yr old female with hx of chronic pain followed by [facility]/oxycodone, VT arrest/QT prolongation, depression, polyneuropathy on gabapentin and R septic bursitis- here for f/u on anoxic brain injury from Vfib arrest in August 2023.  Also having anxiety/PTSD when goes to  doctor's appointments   Anxiety- Can try Buspar/Buspirone- for appointments- 5 mg-10 mg at once- up to 3x/day as needed for anxiety  2.  If Buspar/buspirone doesn't work, then anxiety is a symptom, not a cause- then suggest Prazosin - start at 1-2 mg- max dose is 2-4 mg nightly (side effect of that is lower BP- usually works well if done at night)  instead for sleep- can also slow down formation of chronic PTSD.  Which doesn't cause problems with Qtc prolongation.    3.  Compression socks needed for LE swelling- but also speak to Cardiology about it- wear to knees B/L- take off at night.     4. Still seen at [facility] for pain meds.   5. F/U as needed- call if no one feels comfortable doing Prazosin/Buspar not work OR L foot /ankle doesn't get better  6. O[place]ur foot up brace???

## 2022-01-19 ENCOUNTER — Encounter: Payer: Self-pay | Admitting: Rehabilitative and Restorative Service Providers"

## 2022-01-19 ENCOUNTER — Ambulatory Visit: Payer: BC Managed Care – PPO

## 2022-01-19 ENCOUNTER — Ambulatory Visit: Payer: BC Managed Care – PPO | Admitting: Rehabilitative and Restorative Service Providers"

## 2022-01-19 ENCOUNTER — Ambulatory Visit: Payer: BC Managed Care – PPO | Attending: Physical Medicine and Rehabilitation | Admitting: Occupational Therapy

## 2022-01-19 DIAGNOSIS — R2681 Unsteadiness on feet: Secondary | ICD-10-CM

## 2022-01-19 DIAGNOSIS — R41840 Attention and concentration deficit: Secondary | ICD-10-CM | POA: Diagnosis present

## 2022-01-19 DIAGNOSIS — M6281 Muscle weakness (generalized): Secondary | ICD-10-CM | POA: Insufficient documentation

## 2022-01-19 DIAGNOSIS — R278 Other lack of coordination: Secondary | ICD-10-CM | POA: Diagnosis present

## 2022-01-19 DIAGNOSIS — R2689 Other abnormalities of gait and mobility: Secondary | ICD-10-CM | POA: Diagnosis present

## 2022-01-19 NOTE — Therapy (Signed)
OUTPATIENT PHYSICAL THERAPY NEURO TREATMENT   Patient Name: Kara [name] MRN: [id number] DOB:02-18-1986, 36 y.o., female Today's Date: 01/19/2022   PCP: [name], MD REFERRING PROVIDER: Genice Rouge, MD   PT End of Session - 01/19/22 1315     Visit Number 9    Number of Visits 13    Date for PT Re-Evaluation 01/29/22    Authorization Type BC[employer]    PT Start Time 1318    PT Stop Time 1400    PT Time Calculation (min) 42 min    Equipment Utilized During Treatment Gait belt    Activity Tolerance Patient tolerated treatment well    Behavior During Therapy WFL for tasks assessed/performed                Past Medical History:  Diagnosis Date   Alcoholism in remission (HCC)    per pt in remission since 2017   Anemia    AR (allergic rhinitis)    allergist-- dr Lucie Leather   Biliary dyskinesia    Diastolic CHF (HCC)    followd by dr Demetrius Charity. [facility]   Environmental allergies    GERD (gastroesophageal reflux disease)    Headache    History of acute pancreatitis    alcoholic pancreatitis ,  2016 and 2017   History of sepsis 06/12/2016   with CAP   History of syncope    History of thrombosis 01/29/2015   splenic vein thrombosis-- treated with coumadin--- readmitted for abd. hematoma due to coumadin treated with IR embolization of the IMA branches   Hypertension    Polyneuropathy    Prolonged QT syndrome    cardiologist-- dr Eden Emms (notes in epic)   Severe persistent asthma    pulmonologist-- dr dr Belva Crome (notes in epic)   Past Surgical History:  Procedure Laterality Date   CHOLECYSTECTOMY N/A 06/21/2018   Procedure: LAPAROSCOPIC CHOLECYSTECTOMY;  Surgeon: Abigail Miyamoto, MD;  Location: [facility];  Service: General;  Laterality: N/A;   ESOPHAGOGASTRODUODENOSCOPY N/A 07/17/2014   Procedure: ESOPHAGOGASTRODUODENOSCOPY (EGD);  Surgeon: Dorena Cookey, MD;  Location: [facility];  Service: Endoscopy;  Laterality: N/A;   TYMPANOSTOMY TUBE PLACEMENT Bilateral 11 MONTHS OLD    VIDEO BRONCHOSCOPY Bilateral 10/08/2016   Procedure: VIDEO BRONCHOSCOPY WITH FLUORO;  Surgeon: Chilton Greathouse, MD;  Location: [facility];  Service: Cardiopulmonary;  Laterality: Bilateral;   Patient Active Problem List   Diagnosis Date Noted   Foot drop, left 01/18/2022   Long Q-T syndrome 12/28/2021   Hypomagnesemia 12/15/2021   Chronic diarrhea 12/15/2021   Fracture of multiple ribs 12/15/2021   Anoxic brain injury (HCC) 12/09/2021   Cardiac arrest (HCC) 11/27/2021   Seizure disorder (HCC) 11/27/2021   Chronic anoxic encephalopathy (HCC) 11/27/2021   Tracheostomy status (HCC) 11/27/2021   AKI (acute kidney injury) (HCC)    Seizure (HCC)    Altered mental status    Cardiac arrest (HCC) 11/13/2021   Acute respiratory failure (HCC)    Prolonged Q-T interval on ECG    Hypokalemia    Encephalopathy    Cellulitis of right lower extremity 11/04/2021   Septic prepatellar bursitis of right knee 11/04/2021   Generalized tonic-clonic seizure (HCC) 01/20/2021   Ataxia 01/20/2021   Depression 08/05/2020   Acute on chronic pancreatitis (HCC) 11/06/2019   Elevated CK    Malnutrition of moderate degree 10/23/2019   Pancreatic pseudocyst    Pancreatic cyst    Fatty liver    Other allergic rhinitis 05/03/2019   Food allergy 05/03/2019  Tobacco smoker, less than 10 cigarettes per day 05/03/2019   Asthma, severe persistent, well-controlled 11/11/2017   Lung infiltrate    Hyponatremia 08/27/2016   Hyperglycemia 08/27/2016   Acute on chronic respiratory failure with hypoxia ([place]) 08/26/2016   HCAP (healthcare-associated pneumonia) 07/31/2016   Tobacco abuse 07/31/2016   Respiratory failure with hypoxia ([place]) 07/31/2016   CAP (community acquired pneumonia) 06/13/2016   Asthma exacerbation 06/12/2016   Alcohol abuse, in remission 06/12/2016   Sepsis ([place]) 06/12/2016   Chronic pancreatitis ([place]) 09/09/2015   Gastroesophageal reflux disease 09/09/2015   Benign essential HTN 09/09/2015    FUO (fever of unknown origin)    Abdominal hematoma 02/10/2015   Warfarin-induced coagulopathy ([place]) 02/10/2015   Hypertension 02/10/2015   Hypoalbuminemia due to protein-calorie malnutrition ([place]) 86/76/1950   Alcoholic pancreatitis 93/26/7124   Acute blood loss anemia    Intra-abdominal hematoma 02/09/2015   Pancreatitis, acute    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   Acute pancreatitis 01/29/2015   Sinus tachycardia 01/29/2015   Leukocytosis 01/29/2015   AKI (acute kidney injury) ([place]) 01/29/2015   Insomnia 10/29/2014   Polyneuropathy 09/02/2014   History of alcohol abuse 09/02/2014   Edema 09/02/2014   Neck pain 09/02/2014   Chronic lower back pain 09/02/2014   Numbness of foot 08/02/2014   Hand numbness 08/02/2014   Ataxic gait 08/02/2014   Proximal leg weakness 08/02/2014   Constipation    Abdominal pain 07/16/2014   Hypothermia 07/16/2014   Upper abdominal pain    Abdominal pain, epigastric 07/06/2014   Obesity (BMI 30-39.9) 07/06/2014   Chronic diastolic heart failure ([place]) 07/06/2014   Syncope 04/08/2014   Prolonged QT interval 04/08/2014   Hypokalemia 04/08/2014   Elevated LFTs 04/08/2014    ONSET DATE: 11/13/2021  REFERRING DIAG:  G93.1 (ICD-10-CM) - Anoxic brain damage, not elsewhere classified  G40.909 (ICD-10-CM) - Epilepsy, unspecified, not intractable, without status epilepticus    THERAPY DIAG:  Muscle weakness (generalized)  Unsteadiness on feet  Other abnormalities of gait and mobility  Rationale for Evaluation and Treatment Rehabilitation  SUBJECTIVE:                                                                                                                                                                                              SUBJECTIVE STATEMENT: The patient saw her MD for post hospital visit with some med changes.  She notes patient still has weakness in legs and hips and inquires about extending PT beyond mid October.  Pt  accompanied by: self and family member (Mom)  PERTINENT HISTORY: Pt is a 36 y.o. female admitted 11/13/21 with v-fib/VT  cardiac arrest, pt noted to have some posturing and possible myoclonic activity; UDS negative. EEG without seizure activity. MRI concerning for restricted diffusion in the bilateral posterior frontal, parietal, and superior occipital cortex, concerning for cytotoxic edema from hypoxic ischemic injury. ETT 7/28-8/3. PMH includes HF, HLD, seizure, ETOH use, asthma, anxiety, chronic pain, long QT syndrome.  She participated in inpatient rehab 12/09/21-12/15/21.  PAIN:  No   PRECAUTIONS: Fall and Other: broken ribs from CPR  PATIENT GOALS Walking, leg strength, and balance is my main concern  OBJECTIVE:   TREATMENT 01/19/22: Activity Comments  THEREX 4" step ups x 10 reps R and L sides  No UE support Feels > weakness on the L side   Lateral step ups x 10 reps R and L   Dec'ing UE support  Quadriped gastroc stretch R and L sides   Supine trunk rotation  5 reps R and L sides  Supine bridge with marching 10 reps x 2 sets   Split lunge R And L  10 reps near UE support   Anterior Tibialis strengthening  Standing on 2" block with toes off x 10 reps with UE support   NMR Compliant surface standing With ball toss and horizontal reaching tasks   With SBA support  Tandem standing near support with intermittent Ue Support    Gait: Community gait with curbs, grassy inclines, without loss of balance Required supervision without loss of balance    PATIENT EDUCATION: Education details: None today Person educated:  Education method:  Education comprehension:   Access Code: [email or web address] URL: [email or web address] Date: 01/14/2022-most recent update Prepared by: [facility] - [facility]  Exercises - Seated Heel Slide  - 1-2 x daily - 5 x weekly - 1 sets - 5 reps - 10 sec hold - Standing Gastroc Stretch on Step  - 1-2 x daily - 7 x  weekly - 1 sets - 3 reps - Supine Calf Stretch with Strap  - 1 x daily - 5 x weekly - 2 sets - 10 reps - Seated Ankle Dorsiflexion AROM  - 1 x daily - 5 x weekly - 2 sets - 10 reps - Standing Single Leg Stance with Counter Support  - 1 x daily - 5 x weekly - 2 sets - 30 sec hold - Forward Step Up  - 1 x daily - 5 x weekly - 2 sets - 10 reps - Hooklying Hamstring Stretch with Strap  - 2 x daily - 7 x weekly - 1 sets - 2 reps - 30 seconds hold - Standing Lumbar Spine Flexion Stretch Counter  - 2 x daily - 7 x weekly - 1 sets - 5-10 reps - 15 sec hold - Side Stepping with Resistance at Ankles and Counter Support  - 1 x daily - 5 x weekly - 1 sets - 3-5 reps - Forward and Backward Monster Walk with Resistance at Ankles and Counter Support  - 1 x daily - 5 x weekly - 1 sets - 3-5 reps    _________________________________________________________________________   Below measures were taken at time of initial evaluation unless otherwise specified:  LOWER EXTREMITY ROM:   WFL except ankle dorsiflexion:  see below  Active  Right Eval Left Eval  Hip flexion    Hip extension    Hip abduction    Hip adduction    Hip internal rotation    Hip external rotation    Knee flexion    Knee extension  Ankle dorsiflexion Active:  -4 from neutral; passive + 10 Active:  -4, passive +4  Ankle plantarflexion    Ankle inversion    Ankle eversion     (Blank rows = not tested)  LOWER EXTREMITY MMT:    MMT Right Eval Left Eval  Hip flexion 3+ 3+  Hip extension    Hip abduction 4 4  Hip adduction 4 4  Hip internal rotation    Hip external rotation    Knee flexion 4 4  Knee extension 4 4  Ankle dorsiflexion 3- 3-  Ankle plantarflexion    Ankle inversion    Ankle eversion    (Blank rows = not tested)   FUNCTIONAL TESTs:  5 times sit to stand: 15.29 sec no UE support, wide BOS Timed up and go (TUG): 11.44 sec  M-CTSIB  Condition 1: Firm Surface, EO 30 Sec, Normal Sway  Condition 2: Firm  Surface, EC 30 Sec, Mild Sway  Condition 3: Foam Surface, EO 30 Sec, Mild Sway  Condition 4: Foam Surface, EC 30 Sec, Mild Sway    SLS:  R and L:  10 seconds; Trendelenburg noted on RLE Tandem:  30 seconds each foot position; Trendelenburg with RLE in posterior position. Gait velocity:  10.22 sec in 20 ft:  1.96 ft/sec FGA:  23/30 (Scores <22/30 indicate increased fall risk)  GOALS: Goals reviewed with patient? Yes  SHORT TERM GOALS: Target date: 01/15/2022  Pt will be independent with HEP for improved strength, balance, gait. Baseline: Goal status: MET  2.  Pt will improve 5x sit<>stand to less than or equal to 12.5 sec to demonstrate improved functional strength and transfer efficiency. Baseline: 15.29 sec with wide BOS> 11.65 sec 01/12/2022 Goal status: GOAL MET  3.  Pt will verbalize understanding of fall prevention in home environment.  Baseline:  Goal status: GOAL MET  LONG TERM GOALS: Target date: 01/29/2022  Pt will be independent with HEP for improved strength, balance, transfers, and gait. Baseline:  Goal status: IN PROGRESS  2.  Pt will improve 5x sit<>stand to less than or equal to 10 sec to demonstrate improved functional strength and transfer efficiency. Baseline: 15.29 sec Goal status: IN PROGRESS  3.  Pt will improve FGA score to at least 26/30 to decrease fall risk. Baseline: 23/30 Goal status: IN PROGRESS  4.  Pt will improve gait velocity to at least 2.62 ft/sec for improved gait efficiency and safety. Baseline: 1.96 ft/sec Goal status: IN PROGRESS  ASSESSMENT:  CLINICAL IMPRESSION: The patient is progressing with strength and balance in clinic.  She does well with curb, and unlevel surface negotiation.  PT to continue to progress to LTGs.  Patient inquired about extending beyond mid October due to continued strength, balance, and endurance needs.  We will assess as we get closer to LTG date.   OBJECTIVE IMPAIRMENTS Abnormal gait, decreased balance,  decreased mobility, difficulty walking, decreased ROM, decreased strength, and postural dysfunction.   REHAB POTENTIAL: Good  PLAN: PT FREQUENCY: 2x/week  PT DURATION: 6 weeks plus eval  PLANNED INTERVENTIONS: Therapeutic exercises, Therapeutic activity, Neuromuscular re-education, Balance training, Gait training, Patient/Family education, Self Care, Stair training, Orthotic/Fit training, and Manual therapy  PLAN FOR NEXT SESSION: Review updates to HEP; continue to work on  balance; stairs and curbs; work on ankle flexibility; compliant surface balance.  Anticipate a renewal mid month to extend plan of care.   [place], [place] 01/19/22  [facility] at [facility] [address], [address]  [place], [place] [address] Phone # [phone] Fax # [phone]

## 2022-01-19 NOTE — Therapy (Signed)
OUTPATIENT OCCUPATIONAL THERAPY Treatment Note  Patient Name: Kara [name] MRN: [id number] DOB:May 20, 1985, 36 y.o., female Today's Date: 01/19/2022  PCP: Lawerance Cruel, MD REFERRING PROVIDER: Courtney Heys, MD    OT End of Session - 01/19/22 0900     Visit Number 6    Number of Visits 9    Date for OT Re-Evaluation 02/12/22    Authorization Type BC[employer]    OT Start Time 0848    OT Stop Time 0930    OT Time Calculation (min) 42 min    Activity Tolerance Patient tolerated treatment well    Behavior During Therapy [facility] for tasks assessed/performed                 Past Medical History:  Diagnosis Date   Alcoholism in remission ([place])    per pt in remission since 2017   Anemia    AR (allergic rhinitis)    allergist-- dr Neldon Mc   Biliary dyskinesia    Diastolic CHF ([place])    followd by dr Mamie Nick. Johnsie Cancel   Environmental allergies    GERD (gastroesophageal reflux disease)    Headache    History of acute pancreatitis    alcoholic pancreatitis ,  7829 and 2017   History of sepsis 06/12/2016   with CAP   History of syncope    History of thrombosis 01/29/2015   splenic vein thrombosis-- treated with coumadin--- readmitted for abd. hematoma due to coumadin treated with IR embolization of the IMA branches   Hypertension    Polyneuropathy    Prolonged QT syndrome    cardiologist-- dr Johnsie Cancel (notes in epic)   Severe persistent asthma    pulmonologist-- dr dr Bernita Raisin (notes in epic)   Past Surgical History:  Procedure Laterality Date   CHOLECYSTECTOMY N/A 06/21/2018   Procedure: LAPAROSCOPIC CHOLECYSTECTOMY;  Surgeon: Coralie Keens, MD;  Location: [facility];  Service: General;  Laterality: N/A;   ESOPHAGOGASTRODUODENOSCOPY N/A 07/17/2014   Procedure: ESOPHAGOGASTRODUODENOSCOPY (EGD);  Surgeon: Teena Irani, MD;  Location: [facility];  Service: Endoscopy;  Laterality: N/A;   TYMPANOSTOMY TUBE PLACEMENT Bilateral 11 MONTHS OLD   VIDEO BRONCHOSCOPY Bilateral 10/08/2016    Procedure: VIDEO BRONCHOSCOPY WITH FLUORO;  Surgeon: Marshell Garfinkel, MD;  Location: [place];  Service: Cardiopulmonary;  Laterality: Bilateral;   Patient Active Problem List   Diagnosis Date Noted   Foot drop, left 01/18/2022   Long Q-T syndrome 12/28/2021   Hypomagnesemia 12/15/2021   Chronic diarrhea 12/15/2021   Fracture of multiple ribs 12/15/2021   Anoxic brain injury ([place]) 12/09/2021   Cardiac arrest ([place]) 11/27/2021   Seizure disorder ([place]) 11/27/2021   Chronic anoxic encephalopathy ([place]) 11/27/2021   Tracheostomy status ([place]) 11/27/2021   AKI (acute kidney injury) ([place])    Seizure ([place])    Altered mental status    Cardiac arrest ([place]) 11/13/2021   Acute respiratory failure ([facility])    Prolonged Q-T interval on ECG    Hypokalemia    Encephalopathy    Cellulitis of right lower extremity 11/04/2021   Septic prepatellar bursitis of right knee 11/04/2021   Generalized tonic-clonic seizure ([place]) 01/20/2021   Ataxia 01/20/2021   Depression 08/05/2020   Acute on chronic pancreatitis ([place]) 11/06/2019   Elevated CK    Malnutrition of moderate degree 10/23/2019   Pancreatic pseudocyst    Pancreatic cyst    Fatty liver    Other allergic rhinitis 05/03/2019   Food allergy 05/03/2019   Tobacco smoker, less than 10 cigarettes per day 05/03/2019  Asthma, severe persistent, well-controlled 11/11/2017   Lung infiltrate    Hyponatremia 08/27/2016   Hyperglycemia 08/27/2016   Acute on chronic respiratory failure with hypoxia ([place]) 08/26/2016   HCAP (healthcare-associated pneumonia) 07/31/2016   Tobacco abuse 07/31/2016   Respiratory failure with hypoxia ([place]) 07/31/2016   CAP (community acquired pneumonia) 06/13/2016   Asthma exacerbation 06/12/2016   Alcohol abuse, in remission 06/12/2016   Sepsis ([place]) 06/12/2016   Chronic pancreatitis ([place]) 09/09/2015   Gastroesophageal reflux disease 09/09/2015   Benign essential HTN 09/09/2015   FUO (fever of unknown origin)     Abdominal hematoma 02/10/2015   Warfarin-induced coagulopathy ([place]) 02/10/2015   Hypertension 02/10/2015   Hypoalbuminemia due to protein-calorie malnutrition ([place]) 35/00/9381   Alcoholic pancreatitis 82/99/3716   Acute blood loss anemia    Intra-abdominal hematoma 02/09/2015   Pancreatitis, acute    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   Acute pancreatitis 01/29/2015   Sinus tachycardia 01/29/2015   Leukocytosis 01/29/2015   AKI (acute kidney injury) ([place]) 01/29/2015   Insomnia 10/29/2014   Polyneuropathy 09/02/2014   History of alcohol abuse 09/02/2014   Edema 09/02/2014   Neck pain 09/02/2014   Chronic lower back pain 09/02/2014   Numbness of foot 08/02/2014   Hand numbness 08/02/2014   Ataxic gait 08/02/2014   Proximal leg weakness 08/02/2014   Constipation    Abdominal pain 07/16/2014   Hypothermia 07/16/2014   Upper abdominal pain    Abdominal pain, epigastric 07/06/2014   Obesity (BMI 30-39.9) 07/06/2014   Chronic diastolic heart failure ([place]) 07/06/2014   Syncope 04/08/2014   Prolonged QT interval 04/08/2014   Hypokalemia 04/08/2014   Elevated LFTs 04/08/2014    ONSET DATE: 11/13/21  REFERRING DIAG:  G93.1 (ICD-10-CM) - Anoxic brain damage, not elsewhere classified  G40.909 (ICD-10-CM) - Epilepsy, unspecified, not intractable, without status epilepticus    THERAPY DIAG:  Other lack of coordination  Attention and concentration deficit  Muscle weakness (generalized)  Unsteadiness on feet  Rationale for Evaluation and Treatment Rehabilitation  SUBJECTIVE:   SUBJECTIVE STATEMENT: Pt reports having a follow up with MD who is planning to change her anxiety meds as she feels that she gets "worked up" when coming to therapy appointments and anything more medical in nature.  Pt accompanied by: self and family member (mother)  PERTINENT HISTORY:  HF, HLD, seizure, ETOH use, asthma, anxiety, chronic pain, long QT syndrome.   PRECAUTIONS: Fall  WEIGHT BEARING  RESTRICTIONS No  PAIN:  Are you having pain? No  FALLS: Has patient fallen in last 6 months? Yes. Number of falls 4  LIVING ENVIRONMENT: Lives with: lives with their spouse and mother is staying during the week while husband works Lives in: [employer] Stairs: Yes: Internal: full flight to upstairs, but full bath and bedroom on main floor - so no need to go up steps; and External: 1 step at back door, front door has 6 steps; uses back door primarily Has following equipment at home: Environmental consultant - 2 wheeled, Crutches, Grab bars, and shower stool for [employer]  PLOF: Independent, Independent with basic ADLs, Independent with household mobility without device, Independent with community mobility without device, and 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, multi-tasking, computer skills, writing letters of recommendations, phone calls  PATIENT GOALS to improve typing speed and accuracy, multi-tasking without increased stress  OBJECTIVE:   HAND DOMINANCE: Right  ADLs: Overall ADLs: Mod I, mother is in the home in case of emergency during day and spouse at night  IADLs:  Shopping: utilized grocery cart for energy conservation Light housekeeping: putting away laundry Meal Prep: husband does most of the cooking/grilling so he will shop with her  Community mobility: has gone out of the house for errands and out to eat, current not driving for ~ 6 months and was not driving before due to seizures  Medication management: mother is currently assisting due to new meds Financial management: pt was primary, however family has been doing since hospitalization  Handwriting: 100% legible  MOBILITY STATUS: Independent  POSTURE COMMENTS:  No Significant postural limitations  ACTIVITY TOLERANCE: Activity tolerance: WFL for tasks assessed on eval; does report decreased endurance  UPPER EXTREMITY ROM: WFL Bilaterally   UPPER EXTREMITY MMT:     MMT Right eval  Left eval  Shoulder flexion 5/5 4+/5  Shoulder abduction    Shoulder adduction    Shoulder extension    Shoulder internal rotation    Shoulder external rotation    Middle trapezius    Lower trapezius    Elbow flexion 5/5 4+/5  Elbow extension 5/5 4+/5  Wrist flexion    Wrist extension    Wrist ulnar deviation    Wrist radial deviation    Wrist pronation    Wrist supination    (Blank rows = not tested)  HAND FUNCTION: Grip strength: Right: 52 lbs; Left: 41 lbs  COORDINATION: 9 Hole Peg test: Right: 23.91 sec; Left: 24.57 sec 01/07/22: Right: 20.06 sec; Left: 24.26 sec  COGNITION: Overall cognitive status: Within functional limits for tasks assessed and Pt reports feeling that she needs increased time for processing especially with multi-tasking --------------------------------------------------------------------------------------------------------------------------------------------------------   TODAY'S TREATMENT: 01/19/22 Typing: Completed 5 min timed typing test: 58 WPM, 98% accuracy yielding 57 WPM.  Pt demonstrating disappointment in score.  Discussed implications of autocorrect and time spent correcting simple errors such as punctuation that may be corrected automatically depending on typing program used.  Completed 3 min timed test with challenge to not fix errors to assess time.  Pt completed 71 WPM, 89% accuracy yielding 63 WPM.  Pt pleased with increased speed but still voicing frustration and inability to ignore the errors.  Engaged in discussion of use of predictive text, autocorrect, as well as various timeliness depending on nature of task.  Completed 2 min timed test on "professional" setting with significant improvements.  Pt completing 74 WPM, 95% accuracy yielding 70 WPM.  Reiterated various types of typing requirements and routines with job and need to modify expectations.   01/12/22 Typing:  Completed 5 min of practice typing following by timed 5 min typing  program.  Pt demonstrating improved ease and speed with typing, however program shut down prior to completion of typing test therefore unable to get results.   Categorization: provided with list of items and directed to place in categories according to where she would purchase items.  Task facilitating handwriting, dual tasking, and thinking skills of organization and sequencing. Discussed ability to purchase majority at a large store such as [employer], but able to organize into section of the store. OT then challenged pt to estimate how much money she would spend at each store. Pt able to complete grocery portion, stating "no idea" on how much some of the other items would cost. Planning/Organizing: engaged in complex planning/problem solving prompt requiring pt to organize items into systematic schedule to complete all in time allotted.  Discussed pacing and prioritizing activities to ensure enough energy to complete tasks.  Discussed use of online ordering, curb side pickup,   and use of digital resources to assist as needed.    01/07/22 IADL: Pt reports engaging in some cooking activities incorporating reaching for items in overhead cabinets, carrying heavy dishes and food items, etc. Engaging in crafting activity incorporating threading, tying, and cutting fabric.   9 Hole peg test: Pt demonstrating decreased coordination on L completing in 26.5 and 22.03 sec and reporting decreased control and sensation in L hand. Coordination: Grooved Pegs with LUE for increased coordination. Pt placed pegs with one at a time and removed with one at a time progressing to completing with in-hand manipulation and translation. Pt completed with min difficulty when completing one at a time with mild increased difficulty when attempting to translate from palm to fingertips and 0 drops. Pt reports increased numbness in L hand this session and decreased coordination.  BP assessed - WNL, however on low side for pt.   PATIENT  EDUCATION: Education details: Educated on functional carryover of categorization and planning activity while reiterating energy conservation strategies. Person educated: Patient Education method: Explanation, Demonstration, and Handouts Education comprehension: verbalized understanding and needs further education   HOME EXERCISE PROGRAM: In-hand manipulation and fine motor control handouts from [employer] websites    GOALS: Goals reviewed with patient? Yes  SHORT TERM GOALS: Target date: 01/15/22  Pt will be independent with [facility] HEP to increase coordination as needed for ADLs and vocation tasks. Baseline: Goal status: MET - 01/14/22  2.  Pt will verbalize understanding of energy conservation strategies and be able to verbalize 2 strategies that she is incorporating into IADLs to increase activity tolerance. Baseline:  Goal status: MET - 01/14/22  3.  Pt will demonstrate improved typing speed by 5 WPM while maintaining >95% accuracy to demonstrate increased coordination as needed for RTW. Baseline: (5 minutes): 47 wpm x 97% accuracy = 47 wpm on 12/12/21 Goal status: NOT MET - unable to assess on 9/28 due to program shutting down during typing task   LONG TERM GOALS: Target date: 02/12/22  Pt will demonstrate improved fine motor coordination for ADLs as evidenced by decreasing 9 hole peg test score for BUE by 5 secs Baseline: Right: 23.91 sec; Left: 24.57 sec Goal status: IN PROGRESS  2.  Pt will demonstrate improved typing speed by 10 WPM while maintaining >95% accuracy to demonstrate increased coordination as needed for RTW. Baseline:  Goal status: IN PROGRESS  3.  Pt will complete table top scanning/dual tasking activity at Mod I level. Baseline:  Goal status: IN PROGRESS  4.  Pt will demonstrate and/or verbalize ability to sequence moderately challenging IADL/vocation task (banking, balancing check book/leger, taking phone messages) at Mod I level demonstrating good  alternating attention. Baseline:  Goal status: IN PROGRESS  5.  Pt will verbalize understanding of RTW recommendations and recognize when able to return to work functions. Baseline:  Goal status: IN PROGRESS   ASSESSMENT:  CLINICAL IMPRESSION: Treatment session with focus on coordination with typing and cognitive dual tasking as needed for increased engagement in return to work tasks.  Pt demonstrating significant improvements in coordination with typing, with focus on speed and allowing programs to aid in accuracy/autocorrect or cues to "come back later" to clean it up as needed.  Pt expressing feelings of anxiety impacting her participation in therapy sessions and thoughts on medical visits and return to work.  PERFORMANCE DEFICITS in functional skills including IADLs, coordination, strength, FMC, balance, body mechanics, endurance, cardiopulmonary status limiting function, and UE functional use, cognitive skills including attention,  energy/drive, and sequencing, and psychosocial skills including environmental adaptation and routines and behaviors.   IMPAIRMENTS are limiting patient from IADLs, rest and sleep, and work.   COMORBIDITIES may have co-morbidities  that affects occupational performance. Patient will benefit from skilled OT to address above impairments and improve overall function.  MODIFICATION OR ASSISTANCE TO COMPLETE EVALUATION: Min-Moderate modification of tasks or assist with assess necessary to complete an evaluation.  OT OCCUPATIONAL PROFILE AND HISTORY: Detailed assessment: Review of records and additional review of physical, cognitive, psychosocial history related to current functional performance.  CLINICAL DECISION MAKING: Moderate - several treatment options, min-mod task modification necessary  REHAB POTENTIAL: Good  EVALUATION COMPLEXITY: Low    PLAN: OT FREQUENCY: 1x/week  OT DURATION: 8 weeks  PLANNED INTERVENTIONS: self care/ADL training,  therapeutic exercise, therapeutic activity, balance training, functional mobility training, moist heat, cryotherapy, patient/family education, cognitive remediation/compensation, psychosocial skills training, energy conservation, coping strategies training, and DME and/or AE instructions  RECOMMENDED OTHER SERVICES: N/A  CONSULTED AND AGREED WITH PLAN OF CARE: Patient and family member/caregiver  PLAN FOR NEXT SESSION: Complete typing activity with simulated phone call, dual tasking, executive functioning tasks (to include balancing check book/ledger)   Gaurav Baldree, [place], OTR/L 01/19/2022, 9:01 AM

## 2022-01-20 ENCOUNTER — Ambulatory Visit: Payer: BC Managed Care – PPO

## 2022-01-25 NOTE — Progress Notes (Unsigned)
Office Visit    Patient Name: Kara [name] Date of Encounter: 01/25/2022  PCP:  Kara Floro, MD   [facility]  Cardiologist:  Kara Haws, MD  Advanced Practice Provider:  No care team member to display Electrophysiologist:  None   HPI    Kara [name] is a 36 y.o. 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   She was originally seen by our practice in 2016 for compliant of palpitations and syncope. Patient had witnessed syncopal episode with nausea and vomiting. She has a past medical history significant for EtOH abuse. Her HR on arrival was 110-130 bpm and Otc was greater than 600. 2D Echocardiogram was completed which showed EF 55-60% and grade 2DD. She had a 30-day event monitor was also placed that showed sinus rhythm and sinus tach with no significant arrhythmias. She was started on beta-blockers. It was thought to prolong Qtc was due to electrolyte abnormalities. Repeat 2D echo in 2018 with EF 60-65%, normal wall motion, trivial TR.   She was lost to follow-up until 10/2021 when she presented to the hospital following VF arrest with DCCV in the field  by EMS. She was recently admitted due to right lower extremity cellulitis with negative doppler for DVT. Her course was complicated by hypokalemia and hypomagnesemia. She was discharged with antibiotics with limited Dilaudid prescription. On 12/09/21 she was agonal breathing following a shower at home. Her husband called 911 when EMS arrived they began CPR with DCCV x 4 and 6 round of epi. 2D echo completed showing EF of 60-65%, no RWMA, valvular abnormalities. EP was consulted and patient deemed not invasive candidate for EP procedures. Patient remained stable during hospitalization without further arrhythmias and prolonged Qtc improved with electrolyte treatment.    She presents today for follow-up ****    Past Medical History    Past Medical History:  Diagnosis Date   Alcoholism in remission (HCC)    per pt in remission since 2017   Anemia    AR (allergic rhinitis)    allergist-- dr Kara [name]   Biliary dyskinesia    Diastolic CHF (HCC)    followd by dr Kara [name]. Kara [name]   Environmental allergies    GERD (gastroesophageal reflux disease)    Headache    History of acute pancreatitis    alcoholic pancreatitis ,  2016 and 2017   History of sepsis 06/12/2016   with CAP   History of syncope    History of thrombosis 01/29/2015   splenic vein thrombosis-- treated with coumadin--- readmitted for abd. hematoma due to coumadin treated with IR embolization of the IMA branches   Hypertension    Polyneuropathy    Prolonged QT syndrome    cardiologist-- dr Kara [name] (notes in epic)   Severe persistent asthma    pulmonologist-- dr dr Kara [name] (notes in epic)   Past Surgical History:  Procedure Laterality Date   CHOLECYSTECTOMY N/A 06/21/2018   Procedure: LAPAROSCOPIC CHOLECYSTECTOMY;  Surgeon: Kara Miyamoto, MD;  Location: [facility];  Service: General;  Laterality: N/A;   ESOPHAGOGASTRODUODENOSCOPY N/A 07/17/2014   Procedure: ESOPHAGOGASTRODUODENOSCOPY (EGD);  Surgeon: Kara Cookey, MD;  Location: [facility];  Service: Endoscopy;  Laterality: N/A;   TYMPANOSTOMY TUBE PLACEMENT Bilateral 11 MONTHS OLD   VIDEO BRONCHOSCOPY Bilateral 10/08/2016   Procedure: VIDEO BRONCHOSCOPY WITH FLUORO;  Surgeon: Kara Greathouse, MD;  Location: [facility];  Service: Cardiopulmonary;  Laterality: Bilateral;    Allergies  Allergies  Allergen Reactions   Maxzide [Hydrochlorothiazide W-Triamterene] Nausea Only and Other (See Comments)    Dizziness    Nsaids Diarrhea and Nausea And Vomiting   Other Hives, Itching and Other (See Comments)    TREE NUT - itching, hives   Peanut Butter Flavor Hives and Itching   Peanuts [Peanut Oil] Hives and Itching   Tylenol [Acetaminophen]  Diarrhea, Nausea And Vomiting and Other (See Comments)    Elevated LFT    Wellbutrin [Bupropion] Other (See Comments)    Lowered seizure threshold     EKGs/Labs/Other Studies Reviewed:   The following studies were reviewed today:  12/07/21 echo   IMPRESSIONS     1. Left ventricular ejection fraction, by estimation, is 60 to 65%. The  left ventricle has normal function. The left ventricle has no regional  wall motion abnormalities. Left ventricular diastolic parameters were  normal.   2. Right ventricular systolic function is normal. The right ventricular  size is normal.   3. Left atrial size was mildly dilated.   4. The mitral valve is normal in structure. Mild mitral valve  regurgitation.   5. The aortic valve is tricuspid. Aortic valve regurgitation is not  visualized.   6. The inferior vena cava is normal in size with greater than 50%  respiratory variability, suggesting right atrial pressure of 3 mmHg.   FINDINGS   Left Ventricle: Left ventricular ejection fraction, by estimation, is 60  to 65%. The left ventricle has normal function. The left ventricle has no  regional wall motion abnormalities. The left ventricular internal cavity  size was normal in size. There is   no left ventricular hypertrophy. Left ventricular diastolic parameters  were normal.   Right Ventricle: The right ventricular size is normal. No increase in  right ventricular wall thickness. Right ventricular systolic function is  normal.   Left Atrium: Left atrial size was mildly dilated.   Right Atrium: Right atrial size was normal in size.   Pericardium: There is no evidence of pericardial effusion.   Mitral Valve: The mitral valve is normal in structure. Mild mitral valve  regurgitation.   Tricuspid Valve: The tricuspid valve is normal in structure. Tricuspid  valve regurgitation is trivial.   Aortic Valve: The aortic valve is tricuspid. Aortic valve regurgitation is  not visualized.    Pulmonic Valve: The pulmonic valve was normal in structure. Pulmonic valve  regurgitation is not visualized.   Aorta: The aortic root is normal in size and structure.   Venous: The inferior vena cava is normal in size with greater than 50%  respiratory variability, suggesting right atrial pressure of 3 mmHg.   IAS/Shunts: The atrial septum is grossly normal.   EKG:  EKG is *** ordered today.  The ekg ordered today demonstrates ***  Recent Labs: 11/13/2021: B Natriuretic Peptide 252.7 11/27/2021: TSH 13.439 12/10/2021: ALT 17 12/14/2021: BUN 14; Creatinine, Ser 0.73; Hemoglobin 11.2; Magnesium 1.6; Platelets 143; Potassium 4.4; Sodium 133  Recent Lipid Panel    Component Value Date/Time   CHOL 174 09/09/2015 1724   TRIG 112 12/05/2021 0308   HDL 57 09/09/2015 1724   CHOLHDL 3.1 09/09/2015 1724   VLDL 17 09/09/2015 1724   LDLCALC 100 (H) 09/09/2015 1724    Risk Assessment/Calculations:  {Does this patient have ATRIAL FIBRILLATION?:[id number]}  Home Medications   No outpatient medications have been marked as taking for the 01/26/22 encounter (Appointment) with Elgie Collard,  PA-C.     Review of Systems   ***   All other systems reviewed and are otherwise negative except as noted above.  Physical Exam    VS:  There were no vitals taken for this visit. , BMI There is no height or weight on file to calculate BMI.  Wt Readings from Last 3 Encounters:  01/18/22 193 lb 9.6 oz (87.8 kg)  12/28/21 189 lb (85.7 kg)  12/18/21 189 lb (85.7 kg)     GEN: Well nourished, well developed, in no acute distress. HEENT: normal. Neck: Supple, no JVD, carotid bruits, or masses. Cardiac: ***RRR, no murmurs, rubs, or gallops. No clubbing, cyanosis, edema.  ***Radials/PT 2+ and equal bilaterally.  Respiratory:  ***Respirations regular and unlabored, clear to auscultation bilaterally. GI: Soft, nontender, nondistended. MS: No deformity or atrophy. Skin: Warm and dry, no rash. Neuro:   Strength and sensation are intact. Psych: Normal affect.  Assessment & Plan    VF/VT arrest HFpEF Hypertension Hypomagnesium DM type II  No BP recorded.  {Refresh Note OR Click here to enter BP  :1}***      Disposition: Follow up {follow up:15908} with Jenkins Rouge, MD or APP.  Signed, Elgie Collard, PA-C 01/25/2022, 4:56 PM [facility]

## 2022-01-26 ENCOUNTER — Ambulatory Visit: Payer: BC Managed Care – PPO | Admitting: Occupational Therapy

## 2022-01-26 ENCOUNTER — Encounter: Payer: Self-pay | Admitting: Physician Assistant

## 2022-01-26 ENCOUNTER — Ambulatory Visit: Payer: BC Managed Care – PPO

## 2022-01-26 ENCOUNTER — Other Ambulatory Visit: Payer: Self-pay | Admitting: *Deleted

## 2022-01-26 ENCOUNTER — Ambulatory Visit: Payer: BC Managed Care – PPO | Attending: Physician Assistant | Admitting: Physician Assistant

## 2022-01-26 DIAGNOSIS — M6281 Muscle weakness (generalized): Secondary | ICD-10-CM

## 2022-01-26 DIAGNOSIS — I5032 Chronic diastolic (congestive) heart failure: Secondary | ICD-10-CM

## 2022-01-26 DIAGNOSIS — I10 Essential (primary) hypertension: Secondary | ICD-10-CM

## 2022-01-26 DIAGNOSIS — R2689 Other abnormalities of gait and mobility: Secondary | ICD-10-CM

## 2022-01-26 DIAGNOSIS — I469 Cardiac arrest, cause unspecified: Secondary | ICD-10-CM | POA: Diagnosis not present

## 2022-01-26 DIAGNOSIS — R2681 Unsteadiness on feet: Secondary | ICD-10-CM

## 2022-01-26 DIAGNOSIS — R41840 Attention and concentration deficit: Secondary | ICD-10-CM

## 2022-01-26 DIAGNOSIS — R278 Other lack of coordination: Secondary | ICD-10-CM

## 2022-01-26 MED ORDER — ATORVASTATIN CALCIUM 10 MG PO TABS: ORAL_TABLET | ORAL | 3 refills | Status: AC

## 2022-01-26 MED ORDER — METOPROLOL TARTRATE 25 MG PO TABS: ORAL_TABLET | ORAL | 3 refills | Status: AC

## 2022-01-26 NOTE — Patient Instructions (Signed)
Medication Instructions:  Your physician recommends that you continue on your current medications as directed. Please refer to the Current Medication list given to you today.  *If you need a refill on your cardiac medications before your next appointment, please call your pharmacy*   Lab Work: None ordered If you have labs (blood work) drawn today and your tests are completely normal, you will receive your results only by: [place] (if you have MyChart) OR A paper copy in the mail If you have any lab test that is abnormal or we need to change your treatment, we will call you to review the results.   Follow-Up: At [facility], you and your health needs are our priority.  As part of our continuing mission to provide you with exceptional heart care, we have created designated Provider Care Teams.  These Care Teams include your primary Cardiologist (physician) and Advanced Practice Providers (APPs -  Physician Assistants and Nurse Practitioners) who all work together to provide you with the care you need, when you need it.   Your next appointment:   3 month(s)  The format for your next appointment:   In Person  Provider:   Jenkins Rouge, MD   Other Instructions Check your blood pressure at home daily for 2 weeks and send [place] the readings through mychart or call us.  Important Information About Sugar

## 2022-01-26 NOTE — Therapy (Signed)
OUTPATIENT PHYSICAL THERAPY NEURO TREATMENT   Patient Name: Kara [name] MRN: [id number] DOB:Aug 27, 1985, 36 y.o., female Today's Date: 01/26/2022   PCP: Lawerance Cruel, MD REFERRING PROVIDER: Courtney Heys, MD   PT End of Session - 01/26/22 [phone]     Visit Number 10    Number of Visits 13    Date for PT Re-Evaluation 01/29/22    Authorization Type BCBS    PT Start Time 0805    PT Stop Time 0845    PT Time Calculation (min) 40 min    Equipment Utilized During Treatment Gait belt    Activity Tolerance Patient tolerated treatment well    Behavior During Therapy [facility] for tasks assessed/performed                Past Medical History:  Diagnosis Date   Alcoholism in remission ([place])    per pt in remission since 2017   Anemia    AR (allergic rhinitis)    allergist-- dr Neldon Mc   Biliary dyskinesia    Diastolic CHF ([place])    followd by dr Mamie Nick. Johnsie Cancel   Environmental allergies    GERD (gastroesophageal reflux disease)    Headache    History of acute pancreatitis    alcoholic pancreatitis ,  6553 and 2017   History of sepsis 06/12/2016   with CAP   History of syncope    History of thrombosis 01/29/2015   splenic vein thrombosis-- treated with coumadin--- readmitted for abd. hematoma due to coumadin treated with IR embolization of the IMA branches   Hypertension    Polyneuropathy    Prolonged QT syndrome    cardiologist-- dr Johnsie Cancel (notes in epic)   Severe persistent asthma    pulmonologist-- dr dr Bernita Raisin (notes in epic)   Past Surgical History:  Procedure Laterality Date   CHOLECYSTECTOMY N/A 06/21/2018   Procedure: LAPAROSCOPIC CHOLECYSTECTOMY;  Surgeon: Coralie Keens, MD;  Location: [facility];  Service: General;  Laterality: N/A;   ESOPHAGOGASTRODUODENOSCOPY N/A 07/17/2014   Procedure: ESOPHAGOGASTRODUODENOSCOPY (EGD);  Surgeon: Teena Irani, MD;  Location: [facility];  Service: Endoscopy;  Laterality: N/A;   TYMPANOSTOMY TUBE PLACEMENT Bilateral 11 MONTHS OLD    VIDEO BRONCHOSCOPY Bilateral 10/08/2016   Procedure: VIDEO BRONCHOSCOPY WITH FLUORO;  Surgeon: Marshell Garfinkel, MD;  Location: [facility];  Service: Cardiopulmonary;  Laterality: Bilateral;   Patient Active Problem List   Diagnosis Date Noted   Foot drop, left 01/18/2022   Long Q-T syndrome 12/28/2021   Hypomagnesemia 12/15/2021   Chronic diarrhea 12/15/2021   Fracture of multiple ribs 12/15/2021   Anoxic brain injury ([place]) 12/09/2021   Cardiac arrest ([place]) 11/27/2021   Seizure disorder ([place]) 11/27/2021   Chronic anoxic encephalopathy ([place]) 11/27/2021   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   AKI (acute kidney injury) ([place])    Seizure ([place])    Altered mental status    Cardiac arrest ([place]) 11/13/2021   Acute respiratory failure (HCC)    Prolonged Q-T interval on ECG    Hypokalemia    Encephalopathy    Cellulitis of right lower extremity 11/04/2021   Septic prepatellar bursitis of right knee 11/04/2021   Generalized tonic-clonic seizure ([place]) 01/20/2021   Ataxia 01/20/2021   Depression 08/05/2020   Acute on chronic pancreatitis ([place]) 11/06/2019   Elevated CK    Malnutrition of moderate degree 10/23/2019   Pancreatic pseudocyst    Pancreatic cyst    Fatty liver    Other allergic rhinitis 05/03/2019   Food allergy 05/03/2019  Tobacco smoker, less than 10 cigarettes per day 05/03/2019   Asthma, severe persistent, well-controlled 11/11/2017   Lung infiltrate    Hyponatremia 08/27/2016   Hyperglycemia 08/27/2016   Acute on chronic respiratory failure with hypoxia ([place]) 08/26/2016   HCAP (healthcare-associated pneumonia) 07/31/2016   Tobacco abuse 07/31/2016   Respiratory failure with hypoxia ([place]) 07/31/2016   CAP (community acquired pneumonia) 06/13/2016   Asthma exacerbation 06/12/2016   Alcohol abuse, in remission 06/12/2016   Sepsis ([place]) 06/12/2016   Chronic pancreatitis ([place]) 09/09/2015   Gastroesophageal reflux disease 09/09/2015   Benign essential HTN  09/09/2015   FUO (fever of unknown origin)    Abdominal hematoma 02/10/2015   Warfarin-induced coagulopathy ([place]) 02/10/2015   Hypertension 02/10/2015   Hypoalbuminemia due to protein-calorie malnutrition ([place]) 25/63/8937   Alcoholic pancreatitis 34/28/7681   Acute blood loss anemia    Intra-abdominal hematoma 02/09/2015   Pancreatitis, acute    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   Acute pancreatitis 01/29/2015   Sinus tachycardia 01/29/2015   Leukocytosis 01/29/2015   AKI (acute kidney injury) ([place]) 01/29/2015   Insomnia 10/29/2014   Polyneuropathy 09/02/2014   History of alcohol abuse 09/02/2014   Edema 09/02/2014   Neck pain 09/02/2014   Chronic lower back pain 09/02/2014   Numbness of foot 08/02/2014   Hand numbness 08/02/2014   Ataxic gait 08/02/2014   Proximal leg weakness 08/02/2014   Constipation    Abdominal pain 07/16/2014   Hypothermia 07/16/2014   Upper abdominal pain    Abdominal pain, epigastric 07/06/2014   Obesity (BMI 30-39.9) 07/06/2014   Chronic diastolic heart failure ([place]) 07/06/2014   Syncope 04/08/2014   Prolonged QT interval 04/08/2014   Hypokalemia 04/08/2014   Elevated LFTs 04/08/2014    ONSET DATE: 11/13/2021  REFERRING DIAG:  G93.1 (ICD-10-CM) - Anoxic brain damage, not elsewhere classified  G40.909 (ICD-10-CM) - Epilepsy, unspecified, not intractable, without status epilepticus    THERAPY DIAG:  Muscle weakness (generalized)  Unsteadiness on feet  Other abnormalities of gait and mobility  Rationale for Evaluation and Treatment Rehabilitation  SUBJECTIVE:                                                                                                                                                                                              SUBJECTIVE STATEMENT: Feeling pretty good  Pt accompanied by: self and family member (Mom)  PERTINENT HISTORY: Pt is a 36 y.o. female admitted 11/13/21 with v-fib/VT cardiac arrest, pt noted  to have some posturing and possible myoclonic activity; UDS negative. EEG without seizure activity. MRI concerning for restricted diffusion in the bilateral posterior frontal, parietal,  and superior occipital cortex, concerning for cytotoxic edema from hypoxic ischemic injury. ETT 7/28-8/3. PMH includes HF, HLD, seizure, ETOH use, asthma, anxiety, chronic pain, long QT syndrome.  She participated in inpatient rehab 12/09/21-12/15/21.  PAIN:  No   PRECAUTIONS: Fall and Other: broken ribs from CPR  PATIENT GOALS Walking, leg strength, and balance is my main concern  OBJECTIVE:   TODAY'S TREATMENT: 01/26/22 Activity Comments  LE AROM 1x10   5xSTS (2 trials) 1) 10.28 sec 2) 9.56 sec  Gait speed 3.28 ft/sec  FGA 28/30  HEP review Independent with initial strength and flexibility  Resisted march 3x10   Invisible jump rope 2x15 sec at counter     TREATMENT 01/19/22: Activity Comments  THEREX 4" step ups x 10 reps R and L sides  No UE support Feels > weakness on the L side   Lateral step ups x 10 reps R and L   Dec'ing UE support  Quadriped gastroc stretch R and L sides   Supine trunk rotation  5 reps R and L sides  Supine bridge with marching 10 reps x 2 sets   Split lunge R And L  10 reps near UE support   Anterior Tibialis strengthening  Standing on 2" block with toes off x 10 reps with UE support   NMR Compliant surface standing With ball toss and horizontal reaching tasks   With SBA support  Tandem standing near support with intermittent Ue Support    Gait: Community gait with curbs, grassy inclines, without loss of balance Required supervision without loss of balance    PATIENT EDUCATION: Education details: None today Person educated:  Education method:  Education comprehension:   Access Code: [email or web address] URL: [email or web address] Date: 01/14/2022-most recent update Prepared by: [facility]  Exercises -  Seated Heel Slide  - 1-2 x daily - 5 x weekly - 1 sets - 5 reps - 10 sec hold - Standing Gastroc Stretch on Step  - 1-2 x daily - 7 x weekly - 1 sets - 3 reps - Supine Calf Stretch with Strap  - 1 x daily - 5 x weekly - 2 sets - 10 reps - Seated Ankle Dorsiflexion AROM  - 1 x daily - 5 x weekly - 2 sets - 10 reps - Standing Single Leg Stance with Counter Support  - 1 x daily - 5 x weekly - 2 sets - 30 sec hold - Forward Step Up  - 1 x daily - 5 x weekly - 2 sets - 10 reps - Hooklying Hamstring Stretch with Strap  - 2 x daily - 7 x weekly - 1 sets - 2 reps - 30 seconds hold - Standing Lumbar Spine Flexion Stretch Counter  - 2 x daily - 7 x weekly - 1 sets - 5-10 reps - 15 sec hold - Side Stepping with Resistance at Ankles and Counter Support  - 1 x daily - 5 x weekly - 1 sets - 3-5 reps - Forward and Backward Monster Walk with Resistance at Ankles and Counter Support  - 1 x daily - 5 x weekly - 1 sets - 3-5 reps - Marching with Resistance  - 1 x daily - 7 x weekly - 3 sets - 10 reps - Jumping Rope  - 1 x daily - 7 x weekly - 3 sets - 15-30 hold   _________________________________________________________________________   Below measures were taken at time of initial evaluation unless otherwise specified:  LOWER EXTREMITY ROM:   WFL except ankle dorsiflexion:  see below  Active  Right Eval Left Eval  Hip flexion    Hip extension    Hip abduction    Hip adduction    Hip internal rotation    Hip external rotation    Knee flexion    Knee extension    Ankle dorsiflexion Active:  -4 from neutral; passive + 10 Active:  -4, passive +4  Ankle plantarflexion    Ankle inversion    Ankle eversion     (Blank rows = not tested)  LOWER EXTREMITY MMT:    MMT Right Eval Left Eval  Hip flexion 3+ 3+  Hip extension    Hip abduction 4 4  Hip adduction 4 4  Hip internal rotation    Hip external rotation    Knee flexion 4 4  Knee extension 4 4  Ankle dorsiflexion 3- 3-  Ankle  plantarflexion    Ankle inversion    Ankle eversion    (Blank rows = not tested)   FUNCTIONAL TESTs:  5 times sit to stand: 15.29 sec no UE support, wide BOS Timed up and go (TUG): 11.44 sec  M-CTSIB  Condition 1: Firm Surface, EO 30 Sec, Normal Sway  Condition 2: Firm Surface, EC 30 Sec, Mild Sway  Condition 3: Foam Surface, EO 30 Sec, Mild Sway  Condition 4: Foam Surface, EC 30 Sec, Mild Sway    SLS:  R and L:  10 seconds; Trendelenburg noted on RLE Tandem:  30 seconds each foot position; Trendelenburg with RLE in posterior position. Gait velocity:  10.22 sec in 20 ft:  1.96 ft/sec FGA:  23/30 (Scores <22/30 indicate increased fall risk)  GOALS: Goals reviewed with patient? Yes  SHORT TERM GOALS: Target date: 01/15/2022  Pt will be independent with HEP for improved strength, balance, gait. Baseline: Goal status: MET  2.  Pt will improve 5x sit<>stand to less than or equal to 12.5 sec to demonstrate improved functional strength and transfer efficiency. Baseline: 15.29 sec with wide BOS> 11.65 sec 01/12/2022 Goal status: GOAL MET  3.  Pt will verbalize understanding of fall prevention in home environment.  Baseline:  Goal status: GOAL MET  LONG TERM GOALS: Target date: 01/29/2022  Pt will be independent with HEP for improved strength, balance, transfers, and gait. Baseline:  Goal status: IN PROGRESS  2.  Pt will improve 5x sit<>stand to less than or equal to 10 sec to demonstrate improved functional strength and transfer efficiency. Baseline: 15.29 sec; (01/26/22) 9.56 sec Goal status: MET  3.  Pt will improve FGA score to at least 26/30 to decrease fall risk. Baseline: 23/30; (01/26/22) 28/30 Goal status: MET  4.  Pt will improve gait velocity to at least 2.62 ft/sec for improved gait efficiency and safety. Baseline: 1.96 ft/sec; (01/26/22) 3.28 ft/sec Goal status: MET  ASSESSMENT:  CLINICAL IMPRESSION: Doing well and able to meet objective tests and measures  w/ obvious improvements in LE strength and ambulation capabilities per 5xSTS time and FGA score. Pt reports main apprehension at present is feeling of left ankle weakness/instability when descending stairs.  Initiated additional ankle DF strength via resisted march and plyometric/ballistic movements to improve ankle stability, power, and resiliency. Will re-assess at next session and likely D/C to HEP given goals met and independent in current HEP   OBJECTIVE IMPAIRMENTS Abnormal gait, decreased balance, decreased mobility, difficulty walking, decreased ROM, decreased strength, and postural dysfunction.   REHAB POTENTIAL: Good  PLAN: PT FREQUENCY: 2x/week  PT DURATION: 6 weeks plus eval  PLANNED INTERVENTIONS: Therapeutic exercises, Therapeutic activity, Neuromuscular re-education, Balance training, Gait training, Patient/Family education, Self Care, Stair training, Orthotic/Fit training, and Manual therapy  PLAN FOR NEXT SESSION: Review updates to HEP; continue to work on  balance; stairs and curbs; work on ankle flexibility; compliant surface balance.  Anticipate a renewal mid month to extend plan of care.   8:47 AM, 01/26/22 M. Sherlyn Lees, PT, DPT Physical Therapist- [place] Office Number: [phone]   [facility] [address], [place], [place] [address] Phone # [phone] Fax # [phone]

## 2022-01-26 NOTE — Therapy (Signed)
OUTPATIENT OCCUPATIONAL THERAPY Treatment Note  Patient Name: Pennye Beeghly MRN: [id number] DOB:07/16/1985, 36 y.o., female Today's Date: 01/26/2022  PCP: Lawerance Cruel, MD REFERRING PROVIDER: Courtney Heys, MD    OT End of Session - 01/26/22 [phone]     Visit Number 7    Number of Visits 9    Date for OT Re-Evaluation 02/12/22    Authorization Type BC[employer]    OT Start Time 0850    OT Stop Time 0932    OT Time Calculation (min) 42 min    Activity Tolerance Patient tolerated treatment well    Behavior During Therapy [facility] for tasks assessed/performed                  Past Medical History:  Diagnosis Date   Alcoholism in remission ([place])    per pt in remission since 2017   Anemia    AR (allergic rhinitis)    allergist-- dr Neldon Mc   Biliary dyskinesia    Diastolic CHF ([place])    followd by dr Mamie Nick. Johnsie Cancel   Environmental allergies    GERD (gastroesophageal reflux disease)    Headache    History of acute pancreatitis    alcoholic pancreatitis ,  3903 and 2017   History of sepsis 06/12/2016   with CAP   History of syncope    History of thrombosis 01/29/2015   splenic vein thrombosis-- treated with coumadin--- readmitted for abd. hematoma due to coumadin treated with IR embolization of the IMA branches   Hypertension    Polyneuropathy    Prolonged QT syndrome    cardiologist-- dr Johnsie Cancel (notes in epic)   Severe persistent asthma    pulmonologist-- dr dr Bernita Raisin (notes in epic)   Past Surgical History:  Procedure Laterality Date   CHOLECYSTECTOMY N/A 06/21/2018   Procedure: LAPAROSCOPIC CHOLECYSTECTOMY;  Surgeon: Coralie Keens, MD;  Location: [facility];  Service: General;  Laterality: N/A;   ESOPHAGOGASTRODUODENOSCOPY N/A 07/17/2014   Procedure: ESOPHAGOGASTRODUODENOSCOPY (EGD);  Surgeon: Teena Irani, MD;  Location: [facility];  Service: Endoscopy;  Laterality: N/A;   TYMPANOSTOMY TUBE PLACEMENT Bilateral 11 MONTHS OLD   VIDEO BRONCHOSCOPY Bilateral 10/08/2016    Procedure: VIDEO BRONCHOSCOPY WITH FLUORO;  Surgeon: Marshell Garfinkel, MD;  Location: [facility];  Service: Cardiopulmonary;  Laterality: Bilateral;   Patient Active Problem List   Diagnosis Date Noted   Foot drop, left 01/18/2022   Long Q-T syndrome 12/28/2021   Hypomagnesemia 12/15/2021   Chronic diarrhea 12/15/2021   Fracture of multiple ribs 12/15/2021   Anoxic brain injury ([place]) 12/09/2021   Cardiac arrest ([place]) 11/27/2021   Seizure disorder ([place]) 11/27/2021   Chronic anoxic encephalopathy ([place]) 11/27/2021   Tracheostomy status ([place]) 11/27/2021   AKI (acute kidney injury) ([place])    Seizure ([place])    Altered mental status    Cardiac arrest ([place]) 11/13/2021   Acute respiratory failure ([facility])    Prolonged Q-T interval on ECG    Hypokalemia    Encephalopathy    Cellulitis of right lower extremity 11/04/2021   Septic prepatellar bursitis of right knee 11/04/2021   Generalized tonic-clonic seizure ([place]) 01/20/2021   Ataxia 01/20/2021   Depression 08/05/2020   Acute on chronic pancreatitis ([place]) 11/06/2019   Elevated CK    Malnutrition of moderate degree 10/23/2019   Pancreatic pseudocyst    Pancreatic cyst    Fatty liver    Other allergic rhinitis 05/03/2019   Food allergy 05/03/2019   Tobacco smoker, less than 10 cigarettes per day  05/03/2019   Asthma, severe persistent, well-controlled 11/11/2017   Lung infiltrate    Hyponatremia 08/27/2016   Hyperglycemia 08/27/2016   Acute on chronic respiratory failure with hypoxia ([place]) 08/26/2016   HCAP (healthcare-associated pneumonia) 07/31/2016   Tobacco abuse 07/31/2016   Respiratory failure with hypoxia ([place]) 07/31/2016   CAP (community acquired pneumonia) 06/13/2016   Asthma exacerbation 06/12/2016   Alcohol abuse, in remission 06/12/2016   Sepsis ([place]) 06/12/2016   Chronic pancreatitis ([place]) 09/09/2015   Gastroesophageal reflux disease 09/09/2015   Benign essential HTN 09/09/2015   FUO (fever of unknown origin)     Abdominal hematoma 02/10/2015   Warfarin-induced coagulopathy ([place]) 02/10/2015   Hypertension 02/10/2015   Hypoalbuminemia due to protein-calorie malnutrition ([place]) 04/11/4974   Alcoholic pancreatitis 30/08/1100   Acute blood loss anemia    Intra-abdominal hematoma 02/09/2015   Pancreatitis, acute    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   Acute pancreatitis 01/29/2015   Sinus tachycardia 01/29/2015   Leukocytosis 01/29/2015   AKI (acute kidney injury) ([place]) 01/29/2015   Insomnia 10/29/2014   Polyneuropathy 09/02/2014   History of alcohol abuse 09/02/2014   Edema 09/02/2014   Neck pain 09/02/2014   Chronic lower back pain 09/02/2014   Numbness of foot 08/02/2014   Hand numbness 08/02/2014   Ataxic gait 08/02/2014   Proximal leg weakness 08/02/2014   Constipation    Abdominal pain 07/16/2014   Hypothermia 07/16/2014   Upper abdominal pain    Abdominal pain, epigastric 07/06/2014   Obesity (BMI 30-39.9) 07/06/2014   Chronic diastolic heart failure ([place]) 07/06/2014   Syncope 04/08/2014   Prolonged QT interval 04/08/2014   Hypokalemia 04/08/2014   Elevated LFTs 04/08/2014    ONSET DATE: 11/13/21  REFERRING DIAG:  G93.1 (ICD-10-CM) - Anoxic brain damage, not elsewhere classified  G40.909 (ICD-10-CM) - Epilepsy, unspecified, not intractable, without status epilepticus    THERAPY DIAG:  Other lack of coordination  Attention and concentration deficit  Muscle weakness (generalized)  Unsteadiness on feet  Rationale for Evaluation and Treatment Rehabilitation  SUBJECTIVE:   SUBJECTIVE STATEMENT: Pt reports that she is "Feeling great!" And that her new anxiety medication is allowing for her to "mellow out" slowly in preparation for her medical appointments which tend to be a large source of anxiety.  Pt wants to work most on typing, multi-tasking, etc.    Pt accompanied by: self and family member (mother)  PERTINENT HISTORY:  HF, HLD, seizure, ETOH use, asthma, anxiety,  chronic pain, long QT syndrome.   PRECAUTIONS: Fall  WEIGHT BEARING RESTRICTIONS No  PAIN:  Are you having pain? No  FALLS: Has patient fallen in last 6 months? Yes. Number of falls 4  LIVING ENVIRONMENT: Lives with: lives with their spouse and mother is staying during the week while husband works Lives in: [employer] Stairs: Yes: Internal: full flight to upstairs, but full bath and bedroom on main floor - so no need to go up steps; and External: 1 step at back door, front door has 6 steps; uses back door primarily Has following equipment at home: Environmental consultant - 2 wheeled, Crutches, Grab bars, and shower stool for [employer]  PLOF: Independent, Independent with basic ADLs, Independent with household mobility without device, Independent with community mobility without device, and 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, multi-tasking, computer skills, writing letters of recommendations, phone calls  PATIENT GOALS to improve typing speed and accuracy, multi-tasking without increased stress  OBJECTIVE:   HAND DOMINANCE: Right  ADLs: Overall ADLs: Mod I, mother  is in the home in case of emergency during day and spouse at night  IADLs: Shopping: utilized grocery cart for energy conservation Light housekeeping: putting away laundry Meal Prep: husband does most of the cooking/grilling so he will shop with her  Community mobility: has gone out of the house for errands and out to eat, current not driving for ~ 6 months and was not driving before due to seizures  Medication management: mother is currently assisting due to new meds Financial management: pt was primary, however family has been doing since hospitalization  Handwriting: 100% legible  MOBILITY STATUS: Independent  POSTURE COMMENTS:  No Significant postural limitations  ACTIVITY TOLERANCE: Activity tolerance: WFL for tasks assessed on eval; does report decreased endurance  UPPER EXTREMITY  ROM: WFL Bilaterally   UPPER EXTREMITY MMT:     MMT Right eval Left eval  Shoulder flexion 5/5 4+/5  Shoulder abduction    Shoulder adduction    Shoulder extension    Shoulder internal rotation    Shoulder external rotation    Middle trapezius    Lower trapezius    Elbow flexion 5/5 4+/5  Elbow extension 5/5 4+/5  Wrist flexion    Wrist extension    Wrist ulnar deviation    Wrist radial deviation    Wrist pronation    Wrist supination    (Blank rows = not tested)  HAND FUNCTION: Grip strength: Right: 52 lbs; Left: 41 lbs  COORDINATION: 9 Hole Peg test: Right: 23.91 sec; Left: 24.57 sec 01/07/22: Right: 20.06 sec; Left: 24.26 sec  COGNITION: Overall cognitive status: Within functional limits for tasks assessed and Pt reports feeling that she needs increased time for processing especially with multi-tasking --------------------------------------------------------------------------------------------------------------------------------------------------------   TODAY'S TREATMENT:  01/26/22 Discussed strategies to decrease anxiety with work tasks, such as closing the door, leaving a note on the door, using a dry erase board, posting a "loose schedule" to allow students to know of her availability. Discussed neuropsychologist and possible benefits as pt continues to express anxiety about medical issues as well as return to work.  OT to reach out to referring MD in regards to appropriateness of neuropsychologist referral and for referral. Typing: engaged in typing task while talk radio is going on in the background to simulate work environment.  Pt completed 63 WPM, 98% accuracy yielding 62 WPM.  Pt recognizing decreased speed with increased distractions. Initiated discussion of return to work modifications with "hard stop" in schedule or building in breaks with plan to further address during next session.   01/19/22 Typing: Completed 5 min timed typing test: 58 WPM, 98% accuracy  yielding 57 WPM.  Pt demonstrating disappointment in score.  Discussed implications of autocorrect and time spent correcting simple errors such as punctuation that may be corrected automatically depending on typing program used.  Completed 3 min timed test with challenge to not fix errors to assess time.  Pt completed 58 WPM, 89% accuracy yielding 63 WPM.  Pt pleased with increased speed but still voicing frustration and inability to ignore the errors.  Engaged in discussion of use of predictive text, autocorrect, as well as various timeliness depending on nature of task.  Completed 2 min timed test on "professional" setting with significant improvements.  Pt completing 8 WPM, 95% accuracy yielding 70 WPM.  Reiterated various types of typing requirements and routines with job and need to modify expectations.   01/12/22 Typing:  Completed 5 min of practice typing following by timed 5 min typing program.  Pt  demonstrating improved ease and speed with typing, however program shut down prior to completion of typing test therefore unable to get results.   Categorization: provided with list of items and directed to place in categories according to where she would purchase items.  Task facilitating handwriting, dual tasking, and thinking skills of organization and sequencing. Discussed ability to purchase majority at a large store such as [place], but able to organize into section of the store. OT then challenged pt to estimate how much money she would spend at each store. Pt able to complete grocery portion, stating "no idea" on how much some of the other items would cost. Planning/Organizing: engaged in complex planning/problem solving prompt requiring pt to organize items into systematic schedule to complete all in time allotted.  Discussed pacing and prioritizing activities to ensure enough energy to complete tasks.  Discussed use of online ordering, curb side pickup, and use of digital resources to assist as  needed.   PATIENT EDUCATION: Education details: Educated on functional carryover of categorization and planning activity while reiterating energy conservation strategies. Person educated: Patient Education method: Explanation, Demonstration, and Handouts Education comprehension: verbalized understanding and needs further education   HOME EXERCISE PROGRAM: In-hand manipulation and fine motor control handouts from [employer] websites    GOALS: Goals reviewed with patient? Yes  SHORT TERM GOALS: Target date: 01/15/22  Pt will be independent with  [facility] HEP to increase coordination as needed for ADLs and vocation tasks. Baseline: Goal status: MET - 01/14/22  2.  Pt will verbalize understanding of energy conservation strategies and be able to verbalize 2 strategies that she is incorporating into IADLs to increase activity tolerance. Baseline:  Goal status: MET - 01/14/22  3.  Pt will demonstrate improved typing speed by 5 WPM while maintaining >95% accuracy to demonstrate increased coordination as needed for RTW. Baseline: (5 minutes): 47 wpm x 97% accuracy = 47 wpm on 12/12/21 Goal status: NOT MET - unable to assess on 9/28 due to program shutting down during typing task   LONG TERM GOALS: Target date: 02/12/22  Pt will demonstrate improved fine motor coordination for ADLs as evidenced by decreasing 9 hole peg test score for BUE by 5 secs Baseline: Right: 23.91 sec; Left: 24.57 sec Goal status: IN PROGRESS  2.  Pt will demonstrate improved typing speed by 10 WPM while maintaining >95% accuracy to demonstrate increased coordination as needed for RTW. Baseline:  Goal status: IN PROGRESS  3.  Pt will complete table top scanning/dual tasking activity at Mod I level. Baseline:  Goal status: IN PROGRESS  4.  Pt will demonstrate and/or verbalize ability to sequence moderately challenging IADL/vocation task (banking, balancing check book/leger, taking phone messages) at Mod I level  demonstrating good alternating attention. Baseline:  Goal status: IN PROGRESS  5.  Pt will verbalize understanding of RTW recommendations and recognize when able to return to work functions. Baseline:  Goal status: IN PROGRESS   ASSESSMENT:  CLINICAL IMPRESSION: Treatment session with focus on coordination with typing and cognitive dual tasking as needed for increased engagement in return to work tasks. Lengthy discussion about next steps in regards to upcoming d/c of therapy services, pursuing neuropsychology referral, and beginning process of investigating return to work and/or need for accommodations.    PERFORMANCE DEFICITS in functional skills including IADLs, coordination, strength, FMC, balance, body mechanics, endurance, cardiopulmonary status limiting function, and UE functional use, cognitive skills including attention, energy/drive, and sequencing, and psychosocial skills including environmental adaptation and routines and  behaviors.   IMPAIRMENTS are limiting patient from IADLs, rest and sleep, and work.   COMORBIDITIES may have co-morbidities  that affects occupational performance. Patient will benefit from skilled OT to address above impairments and improve overall function.  MODIFICATION OR ASSISTANCE TO COMPLETE EVALUATION: Min-Moderate modification of tasks or assist with assess necessary to complete an evaluation.  OT OCCUPATIONAL PROFILE AND HISTORY: Detailed assessment: Review of records and additional review of physical, cognitive, psychosocial history related to current functional performance.  CLINICAL DECISION MAKING: Moderate - several treatment options, min-mod task modification necessary  REHAB POTENTIAL: Good  EVALUATION COMPLEXITY: Low    PLAN: OT FREQUENCY: 1x/week  OT DURATION: 8 weeks  PLANNED INTERVENTIONS: self care/ADL training, therapeutic exercise, therapeutic activity, balance training, functional mobility training, moist heat, cryotherapy,  patient/family education, cognitive remediation/compensation, psychosocial skills training, energy conservation, coping strategies training, and DME and/or AE instructions  RECOMMENDED OTHER SERVICES: N/A  CONSULTED AND AGREED WITH PLAN OF CARE: Patient and family member/caregiver  PLAN FOR NEXT SESSION: Complete typing activity with simulated phone call, dual tasking, executive functioning tasks (to include balancing check book/ledger).  Discuss return to work accommodations.     Simonne Come, OTR/L 01/26/2022, 11:53 AM

## 2022-01-28 ENCOUNTER — Ambulatory Visit: Payer: BC Managed Care – PPO | Admitting: Physical Therapy

## 2022-02-01 NOTE — Telephone Encounter (Signed)
-----   Message from Kerrie Buffalo, OT sent at 01/26/2022 12:45 PM EDT ----- Regarding: Neuropsych referral Hi Dr. Dagoberto Ligas,  Kara [name] is being treated by occupational therapy post Anoxic BI and stay on IP Rehab.  We have been talking about her anxiety and all of her concerns in return to increased participation with IADLs and eventual return to work. Today she expressed increased awareness of multiple things that have come throughout her visits with OT/PT/SLP and Neuropsych came up.  I really think this patient would benefit from neuropsychologist evaluation with Dr. Sima Matas and she is in agreement.    If you agree, please place an order as appropriate.  Thank you, Simonne Come, MS, OTR/L    [place] [address] [place], [place]  [address] Phone:  [phone] Fax:  [phone]

## 2022-02-01 NOTE — Therapy (Signed)
OUTPATIENT PHYSICAL THERAPY NEURO RE-CERT   Patient Name: Kara [name] MRN: [id number] DOB:[birth year], 36 y.o., female Today's Date: 02/02/2022   PCP: Lawerance Cruel, MD REFERRING PROVIDER: Courtney Heys, MD   PT End of Session - 02/02/22 1056     Visit Number 11    Number of Visits 19    Date for PT Re-Evaluation 03/02/22    Authorization Type BCBS    PT Start Time 1015    PT Stop Time 1055    PT Time Calculation (min) 40 min    Activity Tolerance Patient tolerated treatment well    Behavior During Therapy [facility] for tasks assessed/performed                 Past Medical History:  Diagnosis Date   Alcoholism in remission ([place])    per pt in remission since 2017   Anemia    AR (allergic rhinitis)    allergist-- dr Neldon Mc   Biliary dyskinesia    Diastolic CHF ([place])    followd by dr Mamie Nick. Johnsie Cancel   Environmental allergies    GERD (gastroesophageal reflux disease)    Headache    History of acute pancreatitis    alcoholic pancreatitis ,  4580 and 2017   History of sepsis 06/12/2016   with CAP   History of syncope    History of thrombosis 01/29/2015   splenic vein thrombosis-- treated with coumadin--- readmitted for abd. hematoma due to coumadin treated with IR embolization of the IMA branches   Hypertension    Polyneuropathy    Prolonged QT syndrome    cardiologist-- dr Johnsie Cancel (notes in epic)   Severe persistent asthma    pulmonologist-- dr dr Bernita Raisin (notes in epic)   Past Surgical History:  Procedure Laterality Date   CHOLECYSTECTOMY N/A 06/21/2018   Procedure: LAPAROSCOPIC CHOLECYSTECTOMY;  Surgeon: Coralie Keens, MD;  Location: [facility];  Service: General;  Laterality: N/A;   ESOPHAGOGASTRODUODENOSCOPY N/A 07/17/2014   Procedure: ESOPHAGOGASTRODUODENOSCOPY (EGD);  Surgeon: Teena Irani, MD;  Location: [facility];  Service: Endoscopy;  Laterality: N/A;   TYMPANOSTOMY TUBE PLACEMENT Bilateral 11 MONTHS OLD   VIDEO BRONCHOSCOPY Bilateral 10/08/2016    Procedure: VIDEO BRONCHOSCOPY WITH FLUORO;  Surgeon: Marshell Garfinkel, MD;  Location: [facility];  Service: Cardiopulmonary;  Laterality: Bilateral;   Patient Active Problem List   Diagnosis Date Noted   Foot drop, left 01/18/2022   Long Q-T syndrome 12/28/2021   Hypomagnesemia 12/15/2021   Chronic diarrhea 12/15/2021   Fracture of multiple ribs 12/15/2021   Anoxic brain injury ([place]) 12/09/2021   Cardiac arrest ([place]) 11/27/2021   Seizure disorder ([place]) 11/27/2021   Chronic anoxic encephalopathy ([place]) 11/27/2021   Tracheostomy status ([place]) 11/27/2021   AKI (acute kidney injury) ([place])    Seizure ([place])    Altered mental status    Cardiac arrest ([place]) 11/13/2021   Acute respiratory failure ([facility])    Prolonged Q-T interval on ECG    Hypokalemia    Encephalopathy    Cellulitis of right lower extremity 11/04/2021   Septic prepatellar bursitis of right knee 11/04/2021   Generalized tonic-clonic seizure ([place]) 01/20/2021   Ataxia 01/20/2021   Depression 08/05/2020   Acute on chronic pancreatitis ([place]) 11/06/2019   Elevated CK    Malnutrition of moderate degree 10/23/2019   Pancreatic pseudocyst    Pancreatic cyst    Fatty liver    Other allergic rhinitis 05/03/2019   Food allergy 05/03/2019   Tobacco smoker, less than 10 cigarettes per day  05/03/2019   Asthma, severe persistent, well-controlled 11/11/2017   Lung infiltrate    Hyponatremia 08/27/2016   Hyperglycemia 08/27/2016   Acute on chronic respiratory failure with hypoxia ([place]) 08/26/2016   HCAP (healthcare-associated pneumonia) 07/31/2016   Tobacco abuse 07/31/2016   Respiratory failure with hypoxia ([place]) 07/31/2016   CAP (community acquired pneumonia) 06/13/2016   Asthma exacerbation 06/12/2016   Alcohol abuse, in remission 06/12/2016   Sepsis ([place]) 06/12/2016   Chronic pancreatitis ([place]) 09/09/2015   Gastroesophageal reflux disease 09/09/2015   Benign essential HTN 09/09/2015   FUO (fever of unknown origin)     Abdominal hematoma 02/10/2015   Warfarin-induced coagulopathy ([place]) 02/10/2015   Hypertension 02/10/2015   Hypoalbuminemia due to protein-calorie malnutrition ([place]) 82/50/5397   Alcoholic pancreatitis 67/34/1937   Acute blood loss anemia    Intra-abdominal hematoma 02/09/2015   Pancreatitis, acute    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   Acute pancreatitis 01/29/2015   Sinus tachycardia 01/29/2015   Leukocytosis 01/29/2015   AKI (acute kidney injury) ([place]) 01/29/2015   Insomnia 10/29/2014   Polyneuropathy 09/02/2014   History of alcohol abuse 09/02/2014   Edema 09/02/2014   Neck pain 09/02/2014   Chronic lower back pain 09/02/2014   Numbness of foot 08/02/2014   Hand numbness 08/02/2014   Ataxic gait 08/02/2014   Proximal leg weakness 08/02/2014   Constipation    Abdominal pain 07/16/2014   Hypothermia 07/16/2014   Upper abdominal pain    Abdominal pain, epigastric 07/06/2014   Obesity (BMI 30-39.9) 07/06/2014   Chronic diastolic heart failure ([place]) 07/06/2014   Syncope 04/08/2014   Prolonged QT interval 04/08/2014   Hypokalemia 04/08/2014   Elevated LFTs 04/08/2014    ONSET DATE: 11/13/2021  REFERRING DIAG:  G93.1 (ICD-10-CM) - Anoxic brain damage, not elsewhere classified  G40.909 (ICD-10-CM) - Epilepsy, unspecified, not intractable, without status epilepticus    THERAPY DIAG:  Muscle weakness (generalized)  Unsteadiness on feet  Other abnormalities of gait and mobility  Rationale for Evaluation and Treatment Rehabilitation  SUBJECTIVE:                                                                                                                                                                                              SUBJECTIVE STATEMENT: Feeling like she is still not able to access her 2nd story at home. Finds going down the stairs is harder. Feels like anxiety is contributing to her problem with stairs. Also reports that last week she rolled her ankle  when stepping on the gravel driveway but navigating curbs has gotten easier.   Pt accompanied by: self and family member (Mom)  PERTINENT HISTORY: Pt is a 36 y.o. female admitted 11/13/21 with v-fib/VT cardiac arrest, pt noted to have some posturing and possible myoclonic activity; UDS negative. EEG without seizure activity. MRI concerning for restricted diffusion in the bilateral posterior frontal, parietal, and superior occipital cortex, concerning for cytotoxic edema from hypoxic ischemic injury. ETT 7/28-8/3. PMH includes HF, HLD, seizure, ETOH use, asthma, anxiety, chronic pain, long QT syndrome.  She participated in inpatient rehab 12/09/21-12/15/21.  PAIN:  No; reports mild "tenderness in R ankle"  PRECAUTIONS: Fall and Other: broken ribs from CPR  PATIENT GOALS Walking, leg strength, and balance is my main concern  OBJECTIVE:    TODAY'S TREATMENT: 02/02/22 Activity Comments  Nustep L5 x 7 min Ues/Les  Good speed   Sitting ankle DF AROM, with yellow TB also Educated and demo'd different ways to perform with TB  SLS on foam 30" each Tendency to fall into inversion B  Ankle eversion MMT L 4, R 4+  Ankle eversion green TB 10x each LE Cueing to avoid hip compensation           HOME EXERCISE PROGRAM Last updated: 02/02/22 Access Code: [email or web address] URL: [email or web address] Date: 02/02/2022 Prepared by: [facility]  Exercises - Standing Gastroc Stretch on Step  - 1-2 x daily - 7 x weekly - 3 reps - 30 sec hold - Seated Ankle Dorsiflexion with Anchored Resistance  - 1 x daily - 5 x weekly - 2 sets - 10 reps - Forward Step Up  - 1 x daily - 5 x weekly - 2 sets - 10 reps - Side Stepping with Resistance at Ankles and Counter Support  - 1 x daily - 5 x weekly - 1 sets - 3-5 reps - Forward and Backward Monster Walk with Resistance at Ankles and Counter Support  - 1 x daily - 5 x weekly - 1 sets - 3-5 reps - Jumping Rope  - 1 x daily - 7  x weekly - 3 sets - 15-30 hold - Seated Ankle Eversion with Resistance  - 1 x daily - 5 x weekly - 2 sets - 10 reps - Single Leg Stance on Foam Pad  - 1 x daily - 5 x weekly - 2-3 sets - 30 sec hold   PATIENT EDUCATION: Education details: HEP review and consolidation, discussion on remaining goals and limitations, POC Person educated: Patient Education method: Explanation, Demonstration, Tactile cues, Verbal cues, and Handouts Education comprehension: verbalized understanding and returned demonstration     _________________________________________________________________________   Below measures were taken at time of initial evaluation unless otherwise specified:  LOWER EXTREMITY ROM:   WFL except ankle dorsiflexion:  see below  Active  Right Eval Left Eval  Hip flexion    Hip extension    Hip abduction    Hip adduction    Hip internal rotation    Hip external rotation    Knee flexion    Knee extension    Ankle dorsiflexion Active:  -4 from neutral; passive + 10 Active:  -4, passive +4  Ankle plantarflexion    Ankle inversion    Ankle eversion     (Blank rows = not tested)  LOWER EXTREMITY MMT:    MMT Right Eval Left Eval  Hip flexion 3+ 3+  Hip extension    Hip abduction 4 4  Hip adduction 4 4  Hip internal rotation    Hip external rotation    Knee flexion 4 4  Knee extension 4 4  Ankle dorsiflexion 3- 3-  Ankle plantarflexion    Ankle inversion    Ankle eversion    (Blank rows = not tested)   FUNCTIONAL TESTs:  5 times sit to stand: 15.29 sec no UE support, wide BOS Timed up and go (TUG): 11.44 sec  M-CTSIB  Condition 1: Firm Surface, EO 30 Sec, Normal Sway  Condition 2: Firm Surface, EC 30 Sec, Mild Sway  Condition 3: Foam Surface, EO 30 Sec, Mild Sway  Condition 4: Foam Surface, EC 30 Sec, Mild Sway    SLS:  R and L:  10 seconds; Trendelenburg noted on RLE Tandem:  30 seconds each foot position; Trendelenburg with RLE in posterior  position. Gait velocity:  10.22 sec in 20 ft:  1.96 ft/sec FGA:  23/30 (Scores <22/30 indicate increased fall risk)  GOALS: Goals reviewed with patient? Yes  SHORT TERM GOALS: Target date: 01/15/2022  Pt will be independent with HEP for improved strength, balance, gait. Baseline: Goal status: MET  2.  Pt will improve 5x sit<>stand to less than or equal to 12.5 sec to demonstrate improved functional strength and transfer efficiency. Baseline: 15.29 sec with wide BOS> 11.65 sec 01/12/2022 Goal status: GOAL MET  3.  Pt will verbalize understanding of fall prevention in home environment.  Baseline:  Goal status: GOAL MET  LONG TERM GOALS: Target date: 03/02/2022  Pt will be independent with HEP for improved strength, balance, transfers, and gait. Baseline:  Goal status: IN PROGRESS  2.  Pt will improve 5x sit<>stand to less than or equal to 10 sec to demonstrate improved functional strength and transfer efficiency. Baseline: 15.29 sec; (01/26/22) 9.56 sec Goal status: MET  3.  Pt will improve FGA score to at least 26/30 to decrease fall risk. Baseline: 23/30; (01/26/22) 28/30 Goal status: MET  4.  Pt will improve gait velocity to at least 2.62 ft/sec for improved gait efficiency and safety. Baseline: 1.96 ft/sec; (01/26/22) 3.28 ft/sec Goal status: MET  5. Patient to reports ability to navigate stairs with alternating reciprocal pattern while holding an object to gain access to laundry room with good stability and confidence.  Baseline: reports hesitation and occasional step-to pattern  Goal status: INITIAL  6.  Pt will demonstrate good stability and confidence when ambulating on outdoor surfaces while maintaining conversation.  Baseline: reports hesitancy  Goal status: INITIAL    ASSESSMENT:  CLINICAL IMPRESSION: Patient arrived to session with report of wanting to continue with PT d/t remaining trouble accessing her home d/t difficulty with stairs and walking on uneven  surfaces. Reports that she recently rolled the R ankle when exiting a truck onto a gravel driveway. Patient has met all current goals, thus updated goals today to reflect patient's remaining impairments. Reviewed HEP and consolidated/updated for max benefit. Patient reported understanding. Patient is progressing well towards goals and would benefit from additional skilled PT services 2x/week for 4 weeks to address newest goals.   OBJECTIVE IMPAIRMENTS Abnormal gait, decreased balance, decreased mobility, difficulty walking, decreased ROM, decreased strength, and postural dysfunction.   REHAB POTENTIAL: Good  PLAN: PT FREQUENCY: 2x/week  PT DURATION: 4 weeks   PLANNED INTERVENTIONS: Therapeutic exercises, Therapeutic activity, Neuromuscular re-education, Balance training, Gait training, Patient/Family education, Self Care, Stair training, Orthotic/Fit training, and Manual therapy  PLAN FOR NEXT SESSION: balance on uneven surfaces, stairs and curbs; work on resisted ankle strengthening    Janene Harvey, PT, DPT 02/02/22 10:59 AM  [facility]  Neuro [address], [place], [place] [address] Phone # [phone] Fax # [phone]

## 2022-02-02 ENCOUNTER — Ambulatory Visit: Payer: BC Managed Care – PPO | Attending: Physical Medicine and Rehabilitation | Admitting: Occupational Therapy

## 2022-02-02 ENCOUNTER — Encounter: Payer: Self-pay | Admitting: Physical Therapy

## 2022-02-02 ENCOUNTER — Ambulatory Visit: Payer: BC Managed Care – PPO | Admitting: Physical Therapy

## 2022-02-02 DIAGNOSIS — R2681 Unsteadiness on feet: Secondary | ICD-10-CM

## 2022-02-02 DIAGNOSIS — R41840 Attention and concentration deficit: Secondary | ICD-10-CM | POA: Diagnosis present

## 2022-02-02 DIAGNOSIS — R278 Other lack of coordination: Secondary | ICD-10-CM

## 2022-02-02 DIAGNOSIS — M6281 Muscle weakness (generalized): Secondary | ICD-10-CM

## 2022-02-02 DIAGNOSIS — R2689 Other abnormalities of gait and mobility: Secondary | ICD-10-CM

## 2022-02-02 NOTE — Therapy (Signed)
OUTPATIENT OCCUPATIONAL THERAPY Treatment Note  Patient Name: Kara [name] MRN: [id number] DOB:08-31-1985, 36 y.o., female Today's Date: 02/02/2022  PCP: Lawerance Cruel, MD REFERRING PROVIDER: Courtney Heys, MD    OT End of Session - 02/02/22 1205     Visit Number 8    Number of Visits 9    Date for OT Re-Evaluation 02/12/22    Authorization Type BC[employer]    OT Start Time 1104    OT Stop Time 1148    OT Time Calculation (min) 44 min    Activity Tolerance Patient tolerated treatment well    Behavior During Therapy [facility] for tasks assessed/performed                   Past Medical History:  Diagnosis Date   Alcoholism in remission ([place])    per pt in remission since 2017   Anemia    AR (allergic rhinitis)    allergist-- dr Neldon Mc   Biliary dyskinesia    Diastolic CHF ([place])    followd by dr Mamie Nick. Johnsie Cancel   Environmental allergies    GERD (gastroesophageal reflux disease)    Headache    History of acute pancreatitis    alcoholic pancreatitis ,  2563 and 2017   History of sepsis 06/12/2016   with CAP   History of syncope    History of thrombosis 01/29/2015   splenic vein thrombosis-- treated with coumadin--- readmitted for abd. hematoma due to coumadin treated with IR embolization of the IMA branches   Hypertension    Polyneuropathy    Prolonged QT syndrome    cardiologist-- dr Johnsie Cancel (notes in epic)   Severe persistent asthma    pulmonologist-- dr dr Bernita Raisin (notes in epic)   Past Surgical History:  Procedure Laterality Date   CHOLECYSTECTOMY N/A 06/21/2018   Procedure: LAPAROSCOPIC CHOLECYSTECTOMY;  Surgeon: Coralie Keens, MD;  Location: [facility];  Service: General;  Laterality: N/A;   ESOPHAGOGASTRODUODENOSCOPY N/A 07/17/2014   Procedure: ESOPHAGOGASTRODUODENOSCOPY (EGD);  Surgeon: Teena Irani, MD;  Location: [facility];  Service: Endoscopy;  Laterality: N/A;   TYMPANOSTOMY TUBE PLACEMENT Bilateral 11 MONTHS OLD   VIDEO BRONCHOSCOPY Bilateral 10/08/2016    Procedure: VIDEO BRONCHOSCOPY WITH FLUORO;  Surgeon: Marshell Garfinkel, MD;  Location: [facility];  Service: Cardiopulmonary;  Laterality: Bilateral;   Patient Active Problem List   Diagnosis Date Noted   Foot drop, left 01/18/2022   Long Q-T syndrome 12/28/2021   Hypomagnesemia 12/15/2021   Chronic diarrhea 12/15/2021   Fracture of multiple ribs 12/15/2021   Anoxic brain injury ([place]) 12/09/2021   Cardiac arrest ([place]) 11/27/2021   Seizure disorder ([place]) 11/27/2021   Chronic anoxic encephalopathy ([place]) 11/27/2021   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) 01/20/2021   Ataxia 01/20/2021   Depression 08/05/2020   Acute on chronic pancreatitis ([place]) 11/06/2019   Elevated CK    Malnutrition of moderate degree 10/23/2019   Pancreatic pseudocyst    Pancreatic cyst    Fatty liver    Other allergic rhinitis 05/03/2019   Food allergy 05/03/2019   Tobacco smoker, less than 10 cigarettes per  day 05/03/2019   Asthma, severe persistent, well-controlled 11/11/2017   Lung infiltrate    Hyponatremia 08/27/2016   Hyperglycemia 08/27/2016   Acute on chronic respiratory failure with hypoxia ([place]) 08/26/2016   HCAP (healthcare-associated pneumonia) 07/31/2016   Tobacco abuse 07/31/2016   Respiratory failure with hypoxia ([place]) 07/31/2016   CAP (community acquired pneumonia) 06/13/2016   Asthma exacerbation 06/12/2016   Alcohol abuse, in remission 06/12/2016   Sepsis ([place]) 06/12/2016   Chronic pancreatitis ([place]) 09/09/2015   Gastroesophageal reflux disease 09/09/2015   Benign essential HTN 09/09/2015   FUO (fever of unknown origin)     Abdominal hematoma 02/10/2015   Warfarin-induced coagulopathy ([place]) 02/10/2015   Hypertension 02/10/2015   Hypoalbuminemia due to protein-calorie malnutrition ([place]) 40/98/1191   Alcoholic pancreatitis 47/82/9562   Acute blood loss anemia    Intra-abdominal hematoma 02/09/2015   Pancreatitis, acute    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   Acute pancreatitis 01/29/2015   Sinus tachycardia 01/29/2015   Leukocytosis 01/29/2015   AKI (acute kidney injury) ([place]) 01/29/2015   Insomnia 10/29/2014   Polyneuropathy 09/02/2014   History of alcohol abuse 09/02/2014   Edema 09/02/2014   Neck pain 09/02/2014   Chronic lower back pain 09/02/2014   Numbness of foot 08/02/2014   Hand numbness 08/02/2014   Ataxic gait 08/02/2014   Proximal leg weakness 08/02/2014   Constipation    Abdominal pain 07/16/2014   Hypothermia 07/16/2014   Upper abdominal pain    Abdominal pain, epigastric 07/06/2014   Obesity (BMI 30-39.9) 07/06/2014   Chronic diastolic heart failure ([place]) 07/06/2014   Syncope 04/08/2014   Prolonged QT interval 04/08/2014   Hypokalemia 04/08/2014   Elevated LFTs 04/08/2014    ONSET DATE: 11/13/21  REFERRING DIAG:  G93.1 (ICD-10-CM) - Anoxic brain damage, not elsewhere classified  G40.909 (ICD-10-CM) - Epilepsy, unspecified, not intractable, without status epilepticus    THERAPY DIAG:  Other lack of coordination  Attention and concentration deficit  Unsteadiness on feet  Other abnormalities of gait and mobility  Rationale for Evaluation and Treatment Rehabilitation  SUBJECTIVE:   SUBJECTIVE STATEMENT: Pt reports that she had to go to the [employer] store to purchase a new laptop, but noticed increased anxiety while in the busy store environment.   Pt accompanied by: self and family member (mother)  PERTINENT HISTORY:  HF, HLD, seizure, ETOH use, asthma, anxiety, chronic pain, long QT syndrome.   PRECAUTIONS: Fall  WEIGHT BEARING RESTRICTIONS No  PAIN:  Are you  having pain? No  FALLS: Has patient fallen in last 6 months? Yes. Number of falls 4  LIVING ENVIRONMENT: Lives with: lives with their spouse and mother is staying during the week while husband works Lives in: [employer] Stairs: Yes: Internal: full flight to upstairs, but full bath and bedroom on main floor - so no need to go up steps; and External: 1 step at back door, front door has 6 steps; uses back door primarily Has following equipment at home: Environmental consultant - 2 wheeled, Crutches, Grab bars, and shower stool for [employer]  PLOF: Independent, Independent with basic ADLs, Independent with household mobility without device, Independent with community mobility without device, and 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, multi-tasking, computer skills, writing letters of recommendations, phone calls  PATIENT GOALS to improve typing speed and accuracy, multi-tasking without increased stress  OBJECTIVE:   HAND DOMINANCE: Right  ADLs: Overall ADLs: Mod I, mother is in the home in case of emergency during day and spouse at night  IADLs: Shopping:  utilized grocery cart for energy conservation Light housekeeping: putting away laundry Meal Prep: husband does most of the cooking/grilling so he will shop with her  Community mobility: has gone out of the house for errands and out to eat, current not driving for ~ 6 months and was not driving before due to seizures  Medication management: mother is currently assisting due to new meds Financial management: pt was primary, however family has been doing since hospitalization  Handwriting: 100% legible  MOBILITY STATUS: Independent  POSTURE COMMENTS:  No Significant postural limitations  ACTIVITY TOLERANCE: Activity tolerance: WFL for tasks assessed on eval; does report decreased endurance  UPPER EXTREMITY ROM: WFL Bilaterally   UPPER EXTREMITY MMT:     MMT Right eval Left eval  Shoulder flexion 5/5 4+/5   Shoulder abduction    Shoulder adduction    Shoulder extension    Shoulder internal rotation    Shoulder external rotation    Middle trapezius    Lower trapezius    Elbow flexion 5/5 4+/5  Elbow extension 5/5 4+/5  Wrist flexion    Wrist extension    Wrist ulnar deviation    Wrist radial deviation    Wrist pronation    Wrist supination    (Blank rows = not tested)  HAND FUNCTION: Grip strength: Right: 52 lbs; Left: 41 lbs  COORDINATION: 9 Hole Peg test: Right: 23.91 sec; Left: 24.57 sec 01/07/22: Right: 20.06 sec; Left: 24.26 sec  COGNITION: Overall cognitive status: Within functional limits for tasks assessed and Pt reports feeling that she needs increased time for processing especially with multi-tasking --------------------------------------------------------------------------------------------------------------------------------------------------------   TODAY'S TREATMENT:  02/02/22 Engaged in computer/website navigation to identify if particular therapy provider offers teleheath visits.  Pt requiring use of dual tasking to attend to task while tuning out distractions in moderately distracting environment.  Pt able to locate necessary information with increased time. Engaged in typing up of recommendations for self (in format used for work) based on discussion with focus on locating appropriate psychiatry clinic and meeting both mental health and physical needs due to decreased transportation resources.  Pt demonstrating good fluidity with typing, even changing format and font for improved readability.    01/26/22 Discussed strategies to decrease anxiety with work tasks, such as closing the door, leaving a note on the door, using a dry erase board, posting a "loose schedule" to allow students to know of her availability. Discussed neuropsychologist and possible benefits as pt continues to express anxiety about medical issues as well as return to work.  OT to reach out to  referring MD in regards to appropriateness of neuropsychologist referral and for referral. Typing: engaged in typing task while talk radio is going on in the background to simulate work environment.  Pt completed 63 WPM, 98% accuracy yielding 62 WPM.  Pt recognizing decreased speed with increased distractions. Initiated discussion of return to work modifications with "hard stop" in schedule or building in breaks with plan to further address during next session.   01/19/22 Typing: Completed 5 min timed typing test: 58 WPM, 98% accuracy yielding 57 WPM.  Pt demonstrating disappointment in score.  Discussed implications of autocorrect and time spent correcting simple errors such as punctuation that may be corrected automatically depending on typing program used.  Completed 3 min timed test with challenge to not fix errors to assess time.  Pt completed 71 WPM, 89% accuracy yielding 63 WPM.  Pt pleased with increased speed but still voicing frustration and inability to ignore  the errors.  Engaged in discussion of use of predictive text, autocorrect, as well as various timeliness depending on nature of task.  Completed 2 min timed test on "professional" setting with significant improvements.  Pt completing 60 WPM, 95% accuracy yielding 70 WPM.  Reiterated various types of typing requirements and routines with job and need to modify expectations.    PATIENT EDUCATION: Education details: Educated on functional carryover of categorization and planning activity while reiterating energy conservation strategies. Person educated: Patient Education method: Explanation, Demonstration, and Handouts Education comprehension: verbalized understanding and needs further education   HOME EXERCISE PROGRAM: In-hand manipulation and fine motor control handouts from [employer] websites    GOALS: Goals reviewed with patient? Yes  SHORT TERM GOALS: Target date: 01/15/22  Pt will be independent with [facility] to  increase coordination as needed for ADLs and vocation tasks. Baseline: Goal status: MET - 01/14/22  2.  Pt will verbalize understanding of energy conservation strategies and be able to verbalize 2 strategies that she is incorporating into IADLs to increase activity tolerance. Baseline:  Goal status: MET - 01/14/22  3.  Pt will demonstrate improved typing speed by 5 WPM while maintaining >95% accuracy to demonstrate increased coordination as needed for RTW. Baseline: (5 minutes): 47 wpm x 97% accuracy = 47 wpm on 12/12/21 Goal status: NOT MET - unable to assess on 9/28 due to program shutting down during typing task   LONG TERM GOALS: Target date: 02/12/22  Pt will demonstrate improved fine motor coordination for ADLs as evidenced by decreasing 9 hole peg test score for BUE by 5 secs Baseline: Right: 23.91 sec; Left: 24.57 sec Goal status: IN PROGRESS  2.  Pt will demonstrate improved typing speed by 10 WPM while maintaining >95% accuracy to demonstrate increased coordination as needed for RTW. Baseline:  Goal status: IN PROGRESS  3.  Pt will complete table top scanning/dual tasking activity at Mod I level. Baseline:  Goal status: IN PROGRESS  4.  Pt will demonstrate and/or verbalize ability to sequence moderately challenging IADL/vocation task (banking, balancing check book/leger, taking phone messages) at Mod I level demonstrating good alternating attention. Baseline:  Goal status: IN PROGRESS  5.  Pt will verbalize understanding of RTW recommendations and recognize when able to return to work functions. Baseline:  Goal status: IN PROGRESS   ASSESSMENT:  CLINICAL IMPRESSION: Treatment session with focus on coordination with typing and cognitive dual tasking as needed for increased engagement in return to work tasks. Lengthy discussion about next steps in regards to recommendations for neuropsychology vs psychiatry.  Pt demonstrating improved coordination and ability to engage in  dual tasking with minimal typing errors.  PERFORMANCE DEFICITS in functional skills including IADLs, coordination, strength, FMC, balance, body mechanics, endurance, cardiopulmonary status limiting function, and UE functional use, cognitive skills including attention, energy/drive, and sequencing, and psychosocial skills including environmental adaptation and routines and behaviors.   IMPAIRMENTS are limiting patient from IADLs, rest and sleep, and work.   COMORBIDITIES may have co-morbidities  that affects occupational performance. Patient will benefit from skilled OT to address above impairments and improve overall function.  MODIFICATION OR ASSISTANCE TO COMPLETE EVALUATION: Min-Moderate modification of tasks or assist with assess necessary to complete an evaluation.  OT OCCUPATIONAL PROFILE AND HISTORY: Detailed assessment: Review of records and additional review of physical, cognitive, psychosocial history related to current functional performance.  CLINICAL DECISION MAKING: Moderate - several treatment options, min-mod task modification necessary  REHAB POTENTIAL: Good  EVALUATION COMPLEXITY:  Low    PLAN: OT FREQUENCY: 1x/week  OT DURATION: 8 weeks  PLANNED INTERVENTIONS: self care/ADL training, therapeutic exercise, therapeutic activity, balance training, functional mobility training, moist heat, cryotherapy, patient/family education, cognitive remediation/compensation, psychosocial skills training, energy conservation, coping strategies training, and DME and/or AE instructions  RECOMMENDED OTHER SERVICES: N/A  CONSULTED AND AGREED WITH PLAN OF CARE: Patient and family member/caregiver  PLAN FOR NEXT SESSION: Complete typing activity with simulated phone call, dual tasking, executive functioning tasks (to include balancing check book/ledger).  Discuss return to work accommodations.     Simonne Come, OTR/L 02/02/2022, 12:06 PM

## 2022-02-04 ENCOUNTER — Ambulatory Visit: Payer: BC Managed Care – PPO

## 2022-02-04 DIAGNOSIS — R278 Other lack of coordination: Secondary | ICD-10-CM | POA: Diagnosis not present

## 2022-02-04 DIAGNOSIS — M6281 Muscle weakness (generalized): Secondary | ICD-10-CM

## 2022-02-04 DIAGNOSIS — R2681 Unsteadiness on feet: Secondary | ICD-10-CM

## 2022-02-04 DIAGNOSIS — R2689 Other abnormalities of gait and mobility: Secondary | ICD-10-CM

## 2022-02-04 NOTE — Therapy (Signed)
OUTPATIENT PHYSICAL THERAPY NEURO TREATMENT   Patient Name: Kara [name] MRN: [id number] DOB:[birth year], 36 y.o., female Today's Date: 02/04/2022   PCP: Lawerance Cruel, MD REFERRING PROVIDER: Courtney Heys, MD   PT End of Session - 02/04/22 0848     Visit Number 12    Number of Visits 19    Date for PT Re-Evaluation 03/02/22    Authorization Type BCBS    PT Start Time 0845    PT Stop Time 0930    PT Time Calculation (min) 45 min    Activity Tolerance Patient tolerated treatment well    Behavior During Therapy [facility] for tasks assessed/performed                 Past Medical History:  Diagnosis Date   Alcoholism in remission ([place])    per pt in remission since 2017   Anemia    AR (allergic rhinitis)    allergist-- dr Neldon Mc   Biliary dyskinesia    Diastolic CHF ([place])    followd by dr Mamie Nick. Johnsie Cancel   Environmental allergies    GERD (gastroesophageal reflux disease)    Headache    History of acute pancreatitis    alcoholic pancreatitis ,  5462 and 2017   History of sepsis 06/12/2016   with CAP   History of syncope    History of thrombosis 01/29/2015   splenic vein thrombosis-- treated with coumadin--- readmitted for abd. hematoma due to coumadin treated with IR embolization of the IMA branches   Hypertension    Polyneuropathy    Prolonged QT syndrome    cardiologist-- dr Johnsie Cancel (notes in epic)   Severe persistent asthma    pulmonologist-- dr dr Bernita Raisin (notes in epic)   Past Surgical History:  Procedure Laterality Date   CHOLECYSTECTOMY N/A 06/21/2018   Procedure: LAPAROSCOPIC CHOLECYSTECTOMY;  Surgeon: Coralie Keens, MD;  Location: [facility];  Service: General;  Laterality: N/A;   ESOPHAGOGASTRODUODENOSCOPY N/A 07/17/2014   Procedure: ESOPHAGOGASTRODUODENOSCOPY (EGD);  Surgeon: Teena Irani, MD;  Location: [facility];  Service: Endoscopy;  Laterality: N/A;   TYMPANOSTOMY TUBE PLACEMENT Bilateral 11 MONTHS OLD   VIDEO BRONCHOSCOPY Bilateral 10/08/2016    Procedure: VIDEO BRONCHOSCOPY WITH FLUORO;  Surgeon: Marshell Garfinkel, MD;  Location: [facility];  Service: Cardiopulmonary;  Laterality: Bilateral;   Patient Active Problem List   Diagnosis Date Noted   Foot drop, left 01/18/2022   Long Q-T syndrome 12/28/2021   Hypomagnesemia 12/15/2021   Chronic diarrhea 12/15/2021   Fracture of multiple ribs 12/15/2021   Anoxic brain injury ([place]) 12/09/2021   Cardiac arrest ([place]) 11/27/2021   Seizure disorder ([place]) 11/27/2021   Chronic anoxic encephalopathy ([place]) 11/27/2021   Tracheostomy status ([place]) 11/27/2021   AKI (acute kidney injury) ([place])    Seizure ([place])    Altered mental status    Cardiac arrest ([place]) 11/13/2021   Acute respiratory failure (H[facility])    Prolonged Q-T interval on ECG    Hypokalemia    Encephalopathy    Cellulitis of right lower extremity 11/04/2021   Septic prepatellar bursitis of right knee 11/04/2021   Generalized tonic-clonic seizure ([place]) 01/20/2021   Ataxia 01/20/2021   Depression 08/05/2020   Acute on chronic pancreatitis ([place]) 11/06/2019   Elevated CK    Malnutrition of moderate degree 10/23/2019   Pancreatic pseudocyst    Pancreatic cyst    Fatty liver    Other allergic rhinitis 05/03/2019   Food allergy 05/03/2019   Tobacco smoker, less than 10 cigarettes per day  05/03/2019   Asthma, severe persistent, well-controlled 11/11/2017   Lung infiltrate    Hyponatremia 08/27/2016   Hyperglycemia 08/27/2016   Acute on chronic respiratory failure with hypoxia ([place]) 08/26/2016   HCAP (healthcare-associated pneumonia) 07/31/2016   Tobacco abuse 07/31/2016   Respiratory failure with hypoxia ([place]) 07/31/2016   CAP (community acquired pneumonia) 06/13/2016   Asthma exacerbation 06/12/2016   Alcohol abuse, in remission 06/12/2016   Sepsis ([place]) 06/12/2016   Chronic pancreatitis ([place]) 09/09/2015   Gastroesophageal reflux disease 09/09/2015   Benign essential HTN 09/09/2015   FUO (fever of unknown origin)     Abdominal hematoma 02/10/2015   Warfarin-induced coagulopathy ([place]) 02/10/2015   Hypertension 02/10/2015   Hypoalbuminemia due to protein-calorie malnutrition ([place]) 02/02/5101   Alcoholic pancreatitis 58/52/7782   Acute blood loss anemia    Intra-abdominal hematoma 02/09/2015   Pancreatitis, acute    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   Acute pancreatitis 01/29/2015   Sinus tachycardia 01/29/2015   Leukocytosis 01/29/2015   AKI (acute kidney injury) ([place]) 01/29/2015   Insomnia 10/29/2014   Polyneuropathy 09/02/2014   History of alcohol abuse 09/02/2014   Edema 09/02/2014   Neck pain 09/02/2014   Chronic lower back pain 09/02/2014   Numbness of foot 08/02/2014   Hand numbness 08/02/2014   Ataxic gait 08/02/2014   Proximal leg weakness 08/02/2014   Constipation    Abdominal pain 07/16/2014   Hypothermia 07/16/2014   Upper abdominal pain    Abdominal pain, epigastric 07/06/2014   Obesity (BMI 30-39.9) 07/06/2014   Chronic diastolic heart failure ([place]) 07/06/2014   Syncope 04/08/2014   Prolonged QT interval 04/08/2014   Hypokalemia 04/08/2014   Elevated LFTs 04/08/2014    ONSET DATE: 11/13/2021  REFERRING DIAG:  G93.1 (ICD-10-CM) - Anoxic brain damage, not elsewhere classified  G40.909 (ICD-10-CM) - Epilepsy, unspecified, not intractable, without status epilepticus    THERAPY DIAG:  Muscle weakness (generalized)  Unsteadiness on feet  Other abnormalities of gait and mobility  Rationale for Evaluation and Treatment Rehabilitation  SUBJECTIVE:                                                                                                                                                                                              SUBJECTIVE STATEMENT: Things are ok. Blood pressure has been good.   Pt accompanied by: self and family member (Mom)  PERTINENT HISTORY: Pt is a 36 y.o. female admitted 11/13/21 with v-fib/VT cardiac arrest, pt noted to have some posturing  and possible myoclonic activity; UDS negative. EEG without seizure activity. MRI concerning for restricted diffusion in the bilateral posterior frontal, parietal, and superior  occipital cortex, concerning for cytotoxic edema from hypoxic ischemic injury. ETT 7/28-8/3. PMH includes HF, HLD, seizure, ETOH use, asthma, anxiety, chronic pain, long QT syndrome.  She participated in inpatient rehab 12/09/21-12/15/21.  PAIN:  No; reports mild "tenderness in R ankle"  PRECAUTIONS: Fall and Other: broken ribs from CPR  PATIENT GOALS Walking, leg strength, and balance is my main concern  OBJECTIVE:   TODAY'S TREATMENT: 02/04/22 Activity Comments  Standing heel raise 1x10 Neutral, ER, IR  Step downs 2x10 4" step   Coordination drills -clockwise/anti-clockwise toe taps on dyna disc -foot elevated on rocker+dyna and ball toss -semi-tandem on foam and dyna disc x 15 sec -feet together on foam, EC--3x15 sec multiple LOB -standing on foam feet together postural perturbations  Resisted walking 15# 5x backward to forward 15# 5x forward to backward -repeated with 20#           TODAY'S TREATMENT: 02/02/22 Activity Comments  Nustep L5 x 7 min Ues/Les  Good speed   Sitting ankle DF AROM, with yellow TB also Educated and demo'd different ways to perform with TB  SLS on foam 30" each Tendency to fall into inversion B  Ankle eversion MMT L 4, R 4+  Ankle eversion green TB 10x each LE Cueing to avoid hip compensation           HOME EXERCISE PROGRAM Last updated: 02/02/22 Access Code: [email or web address] URL: [email or web address] Date: 02/02/2022 Prepared by: [facility]  Exercises - Standing Gastroc Stretch on Step  - 1-2 x daily - 7 x weekly - 3 reps - 30 sec hold - Seated Ankle Dorsiflexion with Anchored Resistance  - 1 x daily - 5 x weekly - 2 sets - 10 reps - Forward Step Up  - 1 x daily - 5 x weekly - 2 sets - 10 reps - Side Stepping with Resistance  at Ankles and Counter Support  - 1 x daily - 5 x weekly - 1 sets - 3-5 reps - Forward and Backward Monster Walk with Resistance at Ankles and Counter Support  - 1 x daily - 5 x weekly - 1 sets - 3-5 reps - Jumping Rope  - 1 x daily - 7 x weekly - 3 sets - 15-30 hold - Seated Ankle Eversion with Resistance  - 1 x daily - 5 x weekly - 2 sets - 10 reps - Single Leg Stance on Foam Pad  - 1 x daily - 5 x weekly - 2-3 sets - 30 sec hold   PATIENT EDUCATION: Education details: HEP review and consolidation, discussion on remaining goals and limitations, POC Person educated: Patient Education method: Explanation, Demonstration, Tactile cues, Verbal cues, and Handouts Education comprehension: verbalized understanding and returned demonstration     _________________________________________________________________________   Below measures were taken at time of initial evaluation unless otherwise specified:  LOWER EXTREMITY ROM:   WFL except ankle dorsiflexion:  see below  Active  Right Eval Left Eval  Hip flexion    Hip extension    Hip abduction    Hip adduction    Hip internal rotation    Hip external rotation    Knee flexion    Knee extension    Ankle dorsiflexion Active:  -4 from neutral; passive + 10 Active:  -4, passive +4  Ankle plantarflexion    Ankle inversion    Ankle eversion     (Blank rows = not tested)  LOWER EXTREMITY MMT:    MMT  Right Eval Left Eval  Hip flexion 3+ 3+  Hip extension    Hip abduction 4 4  Hip adduction 4 4  Hip internal rotation    Hip external rotation    Knee flexion 4 4  Knee extension 4 4  Ankle dorsiflexion 3- 3-  Ankle plantarflexion    Ankle inversion    Ankle eversion    (Blank rows = not tested)   FUNCTIONAL TESTs:  5 times sit to stand: 15.29 sec no UE support, wide BOS Timed up and go (TUG): 11.44 sec  M-CTSIB  Condition 1: Firm Surface, EO 30 Sec, Normal Sway  Condition 2: Firm Surface, EC 30 Sec, Mild Sway  Condition  3: Foam Surface, EO 30 Sec, Mild Sway  Condition 4: Foam Surface, EC 30 Sec, Mild Sway    SLS:  R and L:  10 seconds; Trendelenburg noted on RLE Tandem:  30 seconds each foot position; Trendelenburg with RLE in posterior position. Gait velocity:  10.22 sec in 20 ft:  1.96 ft/sec FGA:  23/30 (Scores <22/30 indicate increased fall risk)  GOALS: Goals reviewed with patient? Yes  SHORT TERM GOALS: Target date: 01/15/2022  Pt will be independent with HEP for improved strength, balance, gait. Baseline: Goal status: MET  2.  Pt will improve 5x sit<>stand to less than or equal to 12.5 sec to demonstrate improved functional strength and transfer efficiency. Baseline: 15.29 sec with wide BOS> 11.65 sec 01/12/2022 Goal status: GOAL MET  3.  Pt will verbalize understanding of fall prevention in home environment.  Baseline:  Goal status: GOAL MET  LONG TERM GOALS: Target date: 03/02/2022  Pt will be independent with HEP for improved strength, balance, transfers, and gait. Baseline:  Goal status: IN PROGRESS  2.  Pt will improve 5x sit<>stand to less than or equal to 10 sec to demonstrate improved functional strength and transfer efficiency. Baseline: 15.29 sec; (01/26/22) 9.56 sec Goal status: MET  3.  Pt will improve FGA score to at least 26/30 to decrease fall risk. Baseline: 23/30; (01/26/22) 28/30 Goal status: MET  4.  Pt will improve gait velocity to at least 2.62 ft/sec for improved gait efficiency and safety. Baseline: 1.96 ft/sec; (01/26/22) 3.28 ft/sec Goal status: MET  5. Patient to reports ability to navigate stairs with alternating reciprocal pattern while holding an object to gain access to laundry room with good stability and confidence.  Baseline: reports hesitation and occasional step-to pattern  Goal status: INITIAL  6.  Pt will demonstrate good stability and confidence when ambulating on outdoor surfaces while maintaining conversation.  Baseline: reports hesitancy   Goal status: INITIAL    ASSESSMENT:  CLINICAL IMPRESSION: Emphasis on improving ankle strategy and stabilization with uneven surfaces and ability to correct LOB via righting reactions and withstanding external forces e.g. postural perturbations and resisted walking drills to enhance amplitude of hip/stepping strategy. Demo tendency for retro-LOB with uneven surfaces + postural perturbations with present but delayed stepping strategy. Continued sessions to refine HEP and balance capabilities  OBJECTIVE IMPAIRMENTS Abnormal gait, decreased balance, decreased mobility, difficulty walking, decreased ROM, decreased strength, and postural dysfunction.   REHAB POTENTIAL: Good  PLAN: PT FREQUENCY: 2x/week  PT DURATION: 4 weeks   PLANNED INTERVENTIONS: Therapeutic exercises, Therapeutic activity, Neuromuscular re-education, Balance training, Gait training, Patient/Family education, Self Care, Stair training, Orthotic/Fit training, and Manual therapy  PLAN FOR NEXT SESSION: balance on uneven surfaces, stairs and curbs; work on resisted ankle strengthening   9:28 AM, 02/04/22 M. Claiborne Billings  Freddie Apley, PT, DPT Physical Therapist- [place] Office Number: [place] at [facility] [address] [place], [place] [address] Phone # [phone] Fax # [phone]

## 2022-02-09 ENCOUNTER — Ambulatory Visit: Payer: BC Managed Care – PPO | Admitting: Occupational Therapy

## 2022-02-09 ENCOUNTER — Ambulatory Visit: Payer: BC Managed Care – PPO | Admitting: Physical Therapy

## 2022-02-09 NOTE — Therapy (Incomplete)
OUTPATIENT PHYSICAL THERAPY NEURO TREATMENT   Patient Name: Kara [name] MRN: [id number] DOB:1985/07/15, 36 y.o., female Today's Date: 02/09/2022   PCP: Lawerance Cruel, MD REFERRING PROVIDER: Courtney Heys, MD         Past Medical History:  Diagnosis Date   Alcoholism in remission [facility])    per pt in remission since 2017   Anemia    AR (allergic rhinitis)    allergist-- dr Neldon Mc   Biliary dyskinesia    Diastolic CHF ([place])    followd by dr Mamie Nick. Johnsie Cancel   Environmental allergies    GERD (gastroesophageal reflux disease)    Headache    History of acute pancreatitis    alcoholic pancreatitis ,  7564 and 2017   History of sepsis 06/12/2016   with CAP   History of syncope    History of thrombosis 01/29/2015   splenic vein thrombosis-- treated with coumadin--- readmitted for abd. hematoma due to coumadin treated with IR embolization of the IMA branches   Hypertension    Polyneuropathy    Prolonged QT syndrome    cardiologist-- dr Johnsie Cancel (notes in epic)   Severe persistent asthma    pulmonologist-- dr dr Bernita Raisin (notes in epic)   Past Surgical History:  Procedure Laterality Date   CHOLECYSTECTOMY N/A 06/21/2018   Procedure: LAPAROSCOPIC CHOLECYSTECTOMY;  Surgeon: Coralie Keens, MD;  Location: [facility];  Service: General;  Laterality: N/A;   ESOPHAGOGASTRODUODENOSCOPY N/A 07/17/2014   Procedure: ESOPHAGOGASTRODUODENOSCOPY (EGD);  Surgeon: Teena Irani, MD;  Location: [facility];  Service: Endoscopy;  Laterality: N/A;   TYMPANOSTOMY TUBE PLACEMENT Bilateral 11 MONTHS OLD   VIDEO BRONCHOSCOPY Bilateral 10/08/2016   Procedure: VIDEO BRONCHOSCOPY WITH FLUORO;  Surgeon: Marshell Garfinkel, MD;  Location: [place];  Service: Cardiopulmonary;  Laterality: Bilateral;   Patient Active Problem List   Diagnosis Date Noted   Foot drop, left 01/18/2022   Long Q-T syndrome 12/28/2021   Hypomagnesemia 12/15/2021   Chronic diarrhea 12/15/2021   Fracture of multiple ribs  12/15/2021   Anoxic brain injury ([place]) 12/09/2021   Cardiac arrest ([place]) 11/27/2021   Seizure disorder ([place]) 11/27/2021   Chronic anoxic encephalopathy ([place]) 11/27/2021   Tracheostomy status ([place]) 11/27/2021   AKI (acute kidney injury) ([place])    Seizure ([place])    Altered mental status    Cardiac arrest ([place]) 11/13/2021   Acute respiratory failure ([facility])    Prolonged Q-T interval on ECG    Hypokalemia    Encephalopathy    Cellulitis of right lower extremity 11/04/2021   Septic prepatellar bursitis of right knee 11/04/2021   Generalized tonic-clonic seizure ([place]) 01/20/2021   Ataxia 01/20/2021   Depression 08/05/2020   Acute on chronic pancreatitis ([place]) 11/06/2019   Elevated CK    Malnutrition of moderate degree 10/23/2019   Pancreatic pseudocyst    Pancreatic cyst    Fatty liver    Other allergic rhinitis 05/03/2019   Food allergy 05/03/2019   Tobacco smoker, less than 10 cigarettes per day 05/03/2019   Asthma, severe persistent, well-controlled 11/11/2017   Lung infiltrate    Hyponatremia 08/27/2016   Hyperglycemia 08/27/2016   Acute on chronic respiratory failure with hypoxia ([place]) 08/26/2016   HCAP (healthcare-associated pneumonia) 07/31/2016   Tobacco abuse 07/31/2016   Respiratory failure with hypoxia ([place]) 07/31/2016   CAP (community acquired pneumonia) 06/13/2016   Asthma exacerbation 06/12/2016   Alcohol abuse, in remission 06/12/2016   Sepsis ([place]) 06/12/2016   Chronic pancreatitis ([place]) 09/09/2015   Gastroesophageal reflux disease 09/09/2015  Benign essential HTN 09/09/2015   FUO (fever of unknown origin)    Abdominal hematoma 02/10/2015   Warfarin-induced coagulopathy ([place]) 02/10/2015   Hypertension 02/10/2015   Hypoalbuminemia due to protein-calorie malnutrition ([place]) 11/57/2620   Alcoholic pancreatitis 35/59/7416   Acute blood loss anemia    Intra-abdominal hematoma 02/09/2015   Pancreatitis, acute    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   Acute  pancreatitis 01/29/2015   Sinus tachycardia 01/29/2015   Leukocytosis 01/29/2015   AKI (acute kidney injury) ([place]) 01/29/2015   Insomnia 10/29/2014   Polyneuropathy 09/02/2014   History of alcohol abuse 09/02/2014   Edema 09/02/2014   Neck pain 09/02/2014   Chronic lower back pain 09/02/2014   Numbness of foot 08/02/2014   Hand numbness 08/02/2014   Ataxic gait 08/02/2014   Proximal leg weakness 08/02/2014   Constipation    Abdominal pain 07/16/2014   Hypothermia 07/16/2014   Upper abdominal pain    Abdominal pain, epigastric 07/06/2014   Obesity (BMI 30-39.9) 07/06/2014   Chronic diastolic heart failure ([place]) 07/06/2014   Syncope 04/08/2014   Prolonged QT interval 04/08/2014   Hypokalemia 04/08/2014   Elevated LFTs 04/08/2014    ONSET DATE: 11/13/2021  REFERRING DIAG:  G93.1 (ICD-10-CM) - Anoxic brain damage, not elsewhere classified  G40.909 (ICD-10-CM) - Epilepsy, unspecified, not intractable, without status epilepticus    THERAPY DIAG:  No diagnosis found.  Rationale for Evaluation and Treatment Rehabilitation  SUBJECTIVE:                                                                                                                                                                                              SUBJECTIVE STATEMENT: ***Things are ok. Blood pressure has been good.   Pt accompanied by: self and family member (Mom)  PERTINENT HISTORY: Pt is a 36 y.o. female admitted 11/13/21 with v-fib/VT cardiac arrest, pt noted to have some posturing and possible myoclonic activity; UDS negative. EEG without seizure activity. MRI concerning for restricted diffusion in the bilateral posterior frontal, parietal, and superior occipital cortex, concerning for cytotoxic edema from hypoxic ischemic injury. ETT 7/28-8/3. PMH includes HF, HLD, seizure, ETOH use, asthma, anxiety, chronic pain, long QT syndrome.  She participated in inpatient rehab 12/09/21-12/15/21.  PAIN:  No;  reports mild "tenderness in R ankle"  PRECAUTIONS: Fall and Other: broken ribs from CPR  PATIENT GOALS Walking, leg strength, and balance is my main concern  OBJECTIVE:    TODAY'S TREATMENT: 02/09/2022 Activity Comments                      TODAY'S TREATMENT: 02/04/22 Activity Comments  Standing heel raise 1x10 Neutral, ER, IR  Step downs 2x10 4" step   Coordination drills -clockwise/anti-clockwise toe taps on dyna disc -foot elevated on rocker+dyna and ball toss -semi-tandem on foam and dyna disc x 15 sec -feet together on foam, EC--3x15 sec multiple LOB -standing on foam feet together postural perturbations  Resisted walking 15# 5x backward to forward 15# 5x forward to backward -repeated with 20#           TODAY'S TREATMENT: 02/02/22 Activity Comments  Nustep L5 x 7 min Ues/Les  Good speed   Sitting ankle DF AROM, with yellow TB also Educated and demo'd different ways to perform with TB  SLS on foam 30" each Tendency to fall into inversion B  Ankle eversion MMT L 4, R 4+  Ankle eversion green TB 10x each LE Cueing to avoid hip compensation           HOME EXERCISE PROGRAM Last updated: 02/02/22 Access Code: [email or web address] URL: [email or web address] Date: 02/02/2022 Prepared by: [facility]  Exercises - Standing Gastroc Stretch on Step  - 1-2 x daily - 7 x weekly - 3 reps - 30 sec hold - Seated Ankle Dorsiflexion with Anchored Resistance  - 1 x daily - 5 x weekly - 2 sets - 10 reps - Forward Step Up  - 1 x daily - 5 x weekly - 2 sets - 10 reps - Side Stepping with Resistance at Ankles and Counter Support  - 1 x daily - 5 x weekly - 1 sets - 3-5 reps - Forward and Backward Monster Walk with Resistance at Ankles and Counter Support  - 1 x daily - 5 x weekly - 1 sets - 3-5 reps - Jumping Rope  - 1 x daily - 7 x weekly - 3 sets - 15-30 hold - Seated Ankle Eversion with Resistance  - 1 x daily - 5 x weekly - 2 sets -  10 reps - Single Leg Stance on Foam Pad  - 1 x daily - 5 x weekly - 2-3 sets - 30 sec hold   PATIENT EDUCATION: Education details: HEP review and consolidation, discussion on remaining goals and limitations, POC Person educated: Patient Education method: Explanation, Demonstration, Tactile cues, Verbal cues, and Handouts Education comprehension: verbalized understanding and returned demonstration     _________________________________________________________________________   Below measures were taken at time of initial evaluation unless otherwise specified:  LOWER EXTREMITY ROM:   WFL except ankle dorsiflexion:  see below  Active  Right Eval Left Eval  Hip flexion    Hip extension    Hip abduction    Hip adduction    Hip internal rotation    Hip external rotation    Knee flexion    Knee extension    Ankle dorsiflexion Active:  -4 from neutral; passive + 10 Active:  -4, passive +4  Ankle plantarflexion    Ankle inversion    Ankle eversion     (Blank rows = not tested)  LOWER EXTREMITY MMT:    MMT Right Eval Left Eval  Hip flexion 3+ 3+  Hip extension    Hip abduction 4 4  Hip adduction 4 4  Hip internal rotation    Hip external rotation    Knee flexion 4 4  Knee extension 4 4  Ankle dorsiflexion 3- 3-  Ankle plantarflexion    Ankle inversion    Ankle eversion    (Blank rows = not tested)  FUNCTIONAL TESTs:  5 times sit to stand: 15.29 sec no UE support, wide BOS Timed up and go (TUG): 11.44 sec  M-CTSIB  Condition 1: Firm Surface, EO 30 Sec, Normal Sway  Condition 2: Firm Surface, EC 30 Sec, Mild Sway  Condition 3: Foam Surface, EO 30 Sec, Mild Sway  Condition 4: Foam Surface, EC 30 Sec, Mild Sway    SLS:  R and L:  10 seconds; Trendelenburg noted on RLE Tandem:  30 seconds each foot position; Trendelenburg with RLE in posterior position. Gait velocity:  10.22 sec in 20 ft:  1.96 ft/sec FGA:  23/30 (Scores <22/30 indicate increased fall  risk)  GOALS: Goals reviewed with patient? Yes  SHORT TERM GOALS: Target date: 01/15/2022  Pt will be independent with HEP for improved strength, balance, gait. Baseline: Goal status: MET  2.  Pt will improve 5x sit<>stand to less than or equal to 12.5 sec to demonstrate improved functional strength and transfer efficiency. Baseline: 15.29 sec with wide BOS> 11.65 sec 01/12/2022 Goal status: GOAL MET  3.  Pt will verbalize understanding of fall prevention in home environment.  Baseline:  Goal status: GOAL MET  LONG TERM GOALS: Target date: 03/02/2022  Pt will be independent with HEP for improved strength, balance, transfers, and gait. Baseline:  Goal status: IN PROGRESS  2.  Pt will improve 5x sit<>stand to less than or equal to 10 sec to demonstrate improved functional strength and transfer efficiency. Baseline: 15.29 sec; (01/26/22) 9.56 sec Goal status: MET  3.  Pt will improve FGA score to at least 26/30 to decrease fall risk. Baseline: 23/30; (01/26/22) 28/30 Goal status: MET  4.  Pt will improve gait velocity to at least 2.62 ft/sec for improved gait efficiency and safety. Baseline: 1.96 ft/sec; (01/26/22) 3.28 ft/sec Goal status: MET  5. Patient to reports ability to navigate stairs with alternating reciprocal pattern while holding an object to gain access to laundry room with good stability and confidence.  Baseline: reports hesitation and occasional step-to pattern  Goal status: INITIAL  6.  Pt will demonstrate good stability and confidence when ambulating on outdoor surfaces while maintaining conversation.  Baseline: reports hesitancy  Goal status: INITIAL    ASSESSMENT:  CLINICAL IMPRESSION: ***Emphasis on improving ankle strategy and stabilization with uneven surfaces and ability to correct LOB via righting reactions and withstanding external forces e.g. postural perturbations and resisted walking drills to enhance amplitude of hip/stepping strategy. Demo  tendency for retro-LOB with uneven surfaces + postural perturbations with present but delayed stepping strategy. Continued sessions to refine HEP and balance capabilities  OBJECTIVE IMPAIRMENTS Abnormal gait, decreased balance, decreased mobility, difficulty walking, decreased ROM, decreased strength, and postural dysfunction.   REHAB POTENTIAL: Good  PLAN: PT FREQUENCY: 2x/week  PT DURATION: 4 weeks   PLANNED INTERVENTIONS: Therapeutic exercises, Therapeutic activity, Neuromuscular re-education, Balance training, Gait training, Patient/Family education, Self Care, Stair training, Orthotic/Fit training, and Manual therapy  PLAN FOR NEXT SESSION: ***balance on uneven surfaces, stairs and curbs; work on resisted ankle strengthening   Mady Haagensen, PT 02/09/22 7:50 AM Phone: [phone] Fax: [facility] [address] [place], [place] [address] Phone # [phone] Fax # [phone]

## 2022-02-11 ENCOUNTER — Ambulatory Visit: Payer: BC Managed Care – PPO

## 2022-02-11 ENCOUNTER — Ambulatory Visit: Payer: BC Managed Care – PPO | Admitting: Occupational Therapy

## 2022-02-11 DIAGNOSIS — R278 Other lack of coordination: Secondary | ICD-10-CM

## 2022-02-11 DIAGNOSIS — R2681 Unsteadiness on feet: Secondary | ICD-10-CM

## 2022-02-11 DIAGNOSIS — M6281 Muscle weakness (generalized): Secondary | ICD-10-CM

## 2022-02-11 DIAGNOSIS — R41840 Attention and concentration deficit: Secondary | ICD-10-CM

## 2022-02-11 NOTE — Therapy (Signed)
OUTPATIENT OCCUPATIONAL THERAPY Treatment Note & Discharge summary  Patient Name: Kara [name] MRN: [id number] DOB:08-01-1985, 36 y.o., female Today's Date: 02/11/2022  PCP: Lawerance Cruel, MD REFERRING PROVIDER: Courtney Heys, MD   OCCUPATIONAL THERAPY DISCHARGE SUMMARY  Visits from Start of Care: 9  Current functional level related to goals / functional outcomes: Pt has resumed IADL tasks such as meal prep and completing laundry tasks without assistance.  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  Pt has demonstrated improvements in processing speed and coordination as needed for typing and return to work functions.     Remaining deficits: Decreased endurance, fluctuations in coordination based on multifactorial issues   Education / Equipment: Educated on coordination exercises, modifications and adaptive strategies for return to work   Patient agrees to discharge. Patient goals were met. Patient is being discharged due to meeting the stated rehab goals..      OT End of Session - 02/11/22 1136     Visit Number 9    Number of Visits 9    Date for OT Re-Evaluation 02/12/22    Authorization Type [employer]    OT Start Time 0932    OT Stop Time 1022    OT Time Calculation (min) 50 min    Activity Tolerance Patient tolerated treatment well    Behavior During Therapy WFL for tasks assessed/performed                    Past Medical History:  Diagnosis Date   Alcoholism in remission ([place])    per pt in remission since 2017   Anemia    AR (allergic rhinitis)    allergist-- dr Neldon Mc   Biliary dyskinesia    Diastolic CHF ([place])    followd by dr Mamie Nick. Johnsie Cancel   Environmental allergies    GERD (gastroesophageal reflux disease)    Headache    History of acute pancreatitis    alcoholic pancreatitis ,  5361 and 2017   History of sepsis 06/12/2016   with CAP    History of syncope    History of thrombosis 01/29/2015   splenic vein thrombosis-- treated with coumadin--- readmitted for abd. hematoma due to coumadin treated with IR embolization of the IMA branches   Hypertension    Polyneuropathy    Prolonged QT syndrome    cardiologist-- dr Johnsie Cancel (notes in epic)   Severe persistent asthma    pulmonologist-- dr dr Bernita Raisin (notes in epic)   Past Surgical History:  Procedure Laterality Date   CHOLECYSTECTOMY N/A 06/21/2018   Procedure: LAPAROSCOPIC CHOLECYSTECTOMY;  Surgeon: Coralie Keens, MD;  Location: [facility];  Service: General;  Laterality: N/A;   ESOPHAGOGASTRODUODENOSCOPY N/A 07/17/2014   Procedure: ESOPHAGOGASTRODUODENOSCOPY (EGD);  Surgeon: Teena Irani, MD;  Location: [facility];  Service: Endoscopy;  Laterality: N/A;   TYMPANOSTOMY TUBE PLACEMENT Bilateral 11 MONTHS OLD   VIDEO BRONCHOSCOPY Bilateral 10/08/2016   Procedure: VIDEO BRONCHOSCOPY WITH FLUORO;  Surgeon: Marshell Garfinkel, MD;  Location: [facility];  Service: Cardiopulmonary;  Laterality: Bilateral;   Patient Active Problem List   Diagnosis Date Noted   Foot drop, left 01/18/2022   Long Q-T syndrome 12/28/2021   Hypomagnesemia 12/15/2021   Chronic diarrhea 12/15/2021   Fracture of multiple ribs 12/15/2021   Anoxic brain injury ([place]) 12/09/2021   Cardiac arrest ([place]) 11/27/2021   Seizure disorder ([place]) 11/27/2021   Chronic anoxic encephalopathy ([place]) 11/27/2021  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/2021   AKI (acute kidney injury) ([place])    Seizure ([place])    Altered mental status    Cardiac arrest ([place]) 11/13/2021   Acute respiratory failure ([facility])    Prolonged Q-T interval on ECG    Hypokalemia    Encephalopathy    Cellulitis of right lower extremity 11/04/2021   Septic prepatellar bursitis of right knee 11/04/2021   Generalized tonic-clonic seizure ([place]) 01/20/2021   Ataxia 01/20/2021   Depression 08/05/2020   Acute on chronic pancreatitis ([place]) 11/06/2019   Elevated  CK    Malnutrition of moderate degree 10/23/2019   Pancreatic pseudocyst    Pancreatic cyst    Fatty liver    Other allergic rhinitis 05/03/2019   Food allergy 05/03/2019   Tobacco smoker, less than 10 cigarettes per day 05/03/2019   Asthma, severe persistent, well-controlled 11/11/2017   Lung infiltrate    Hyponatremia 08/27/2016   Hyperglycemia 08/27/2016   Acute on chronic respiratory failure with hypoxia ([place]) 08/26/2016   HCAP (healthcare-associated pneumonia) 07/31/2016   Tobacco abuse 07/31/2016   Respiratory failure with hypoxia ([place]) 07/31/2016   CAP (community acquired pneumonia) 06/13/2016   Asthma exacerbation 06/12/2016   Alcohol abuse, in remission 06/12/2016   Sepsis ([place]) 06/12/2016   Chronic pancreatitis ([place]) 09/09/2015   Gastroesophageal reflux disease 09/09/2015   Benign essential HTN 09/09/2015   FUO (fever of unknown origin)    Abdominal hematoma 02/10/2015   Warfarin-induced coagulopathy ([place]) 02/10/2015   Hypertension 02/10/2015   Hypoalbuminemia due to protein-calorie malnutrition ([place]) 44/96/7591   Alcoholic pancreatitis 63/84/6659   Acute blood loss anemia    Intra-abdominal hematoma 02/09/2015   Pancreatitis, acute    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   Acute pancreatitis 01/29/2015   Sinus tachycardia 01/29/2015   Leukocytosis 01/29/2015   AKI (acute kidney injury) ([place]) 01/29/2015   Insomnia 10/29/2014   Polyneuropathy 09/02/2014   History of alcohol abuse 09/02/2014   Edema 09/02/2014   Neck pain 09/02/2014   Chronic lower back pain 09/02/2014   Numbness of foot 08/02/2014   Hand numbness 08/02/2014   Ataxic gait 08/02/2014   Proximal leg weakness 08/02/2014   Constipation    Abdominal pain 07/16/2014   Hypothermia 07/16/2014   Upper abdominal pain    Abdominal pain, epigastric 07/06/2014   Obesity (BMI 30-39.9) 07/06/2014   Chronic diastolic heart failure ([place]) 07/06/2014   Syncope 04/08/2014   Prolonged QT interval 04/08/2014    Hypokalemia 04/08/2014   Elevated LFTs 04/08/2014    ONSET DATE: 11/13/21  REFERRING DIAG:  G93.1 (ICD-10-CM) - Anoxic brain damage, not elsewhere classified  G40.909 (ICD-10-CM) - Epilepsy, unspecified, not intractable, without status epilepticus    THERAPY DIAG:  Muscle weakness (generalized)  Other lack of coordination  Attention and concentration deficit  Unsteadiness on feet  Rationale for Evaluation and Treatment Rehabilitation  SUBJECTIVE:   SUBJECTIVE STATEMENT: Pt reports that she spoke with her boss who seems open to providing accommodations and discussion about providing part time work options.   Pt accompanied by: self and family member (mother)  PERTINENT HISTORY:  HF, HLD, seizure, ETOH use, asthma, anxiety, chronic pain, long QT syndrome.   PRECAUTIONS: Fall  WEIGHT BEARING RESTRICTIONS No  PAIN:  Are you having pain? No  FALLS: Has patient fallen in last 6 months? Yes. Number of falls 4  LIVING ENVIRONMENT: Lives with: lives with their spouse and mother is staying during the week while husband works Lives in: House/apartment Stairs: Yes: Internal: full flight  to upstairs, but full bath and bedroom on main floor - so no need to go up steps; and External: 1 step at back door, front door has 6 steps; uses back door primarily Has following equipment at home: Walker - 2 wheeled, Crutches, Grab bars, and shower stool for jacuzzi tub  PLOF: Independent, Independent with basic ADLs, Independent with household mobility without device, Independent with community mobility without device, and 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, multi-tasking, computer skills, writing letters of recommendations, phone calls  PATIENT GOALS to improve typing speed and accuracy, multi-tasking without increased stress  OBJECTIVE:   HAND DOMINANCE: Right  ADLs: Overall ADLs: Mod I, mother is in the home in case of emergency during day and spouse  at night  IADLs: Shopping: utilized grocery cart for energy conservation Light housekeeping: putting away laundry Meal Prep: husband does most of the cooking/grilling so he will shop with her  Community mobility: has gone out of the house for errands and out to eat, current not driving for ~ 6 months and was not driving before due to seizures  Medication management: mother is currently assisting due to new meds Financial management: pt was primary, however family has been doing since hospitalization  Handwriting: 100% legible  MOBILITY STATUS: Independent  POSTURE COMMENTS:  No Significant postural limitations  ACTIVITY TOLERANCE: Activity tolerance: WFL for tasks assessed on eval; does report decreased endurance  UPPER EXTREMITY ROM: WFL Bilaterally   UPPER EXTREMITY MMT:     MMT Right eval Left eval  Shoulder flexion 5/5 4+/5  Shoulder abduction    Shoulder adduction    Shoulder extension    Shoulder internal rotation    Shoulder external rotation    Middle trapezius    Lower trapezius    Elbow flexion 5/5 4+/5  Elbow extension 5/5 4+/5  Wrist flexion    Wrist extension    Wrist ulnar deviation    Wrist radial deviation    Wrist pronation    Wrist supination    (Blank rows = not tested)  HAND FUNCTION: Grip strength: Right: 52 lbs; Left: 41 lbs  COORDINATION: 9 Hole Peg test: Right: 23.91 sec; Left: 24.57 sec 01/07/22: Right: 20.06 sec; Left: 24.26 sec  COGNITION: Overall cognitive status: Within functional limits for tasks assessed and Pt reports feeling that she needs increased time for processing especially with multi-tasking --------------------------------------------------------------------------------------------------------------------------------------------------------   TODAY'S TREATMENT:  02/11/22 RTW: pt spoke with boss who is open to providing modifications to work day and hopes to return in January.  Provided with return to work assessment  from [employer] and discussed key areas that may still be impacting pt in regards to return to work. 9 hole peg test: R: 20 and L: 23 IADL: pt reports returning to completing home making tasks such as laundry, cooking a few times/week with significant improvements.  Reports "least of my worries".  Pt assists with husband's book keeping and has been able to create invoices with husband checking behind for any errors - pt has not made any errors.  Pt reports that she has been able to take some tasks off of her plate that actually make more sense for husband to complete. Typing:  engaged in typing task while talk radio is going on in the background to simulate work environment.  Pt completed 56 WPM, 98% accuracy yielding 55 WPM.  Pt tearful, however OT reminding pt of typical work environment and assessing when needing to close the door to allow for  increased focus.  Pt completed typing task again without talk radio/additional noise and completed in 58 WPM, 98% accuracy, yielding 57 WPM.  Pt tearful about performance with typing this session.  OT reiterated fluctuations in performance and utilizing strategies to decrease distractions and increase performance as needed.      02/02/22 Engaged in computer/website navigation to identify if particular therapy provider offers teleheath visits.  Pt requiring use of dual tasking to attend to task while tuning out distractions in moderately distracting environment.  Pt able to locate necessary information with increased time. Engaged in typing up of recommendations for self (in format used for work) based on discussion with focus on locating appropriate psychiatry clinic and meeting both mental health and physical needs due to decreased transportation resources.  Pt demonstrating good fluidity with typing, even changing format and font for improved readability.    01/26/22 Discussed strategies to decrease anxiety with work tasks, such as closing  the door, leaving a note on the door, using a dry erase board, posting a "loose schedule" to allow students to know of her availability. Discussed neuropsychologist and possible benefits as pt continues to express anxiety about medical issues as well as return to work.  OT to reach out to referring MD in regards to appropriateness of neuropsychologist referral and for referral. Typing: engaged in typing task while talk radio is going on in the background to simulate work environment.  Pt completed 63 WPM, 98% accuracy yielding 62 WPM.  Pt recognizing decreased speed with increased distractions. Initiated discussion of return to work modifications with "hard stop" in schedule or building in breaks with plan to further address during next session.     PATIENT EDUCATION: Education details: Educated on functional carryover of categorization and planning activity while reiterating energy conservation strategies. Person educated: Patient Education method: Explanation, Demonstration, and Handouts Education comprehension: verbalized understanding and needs further education   HOME EXERCISE PROGRAM: In-hand manipulation and fine motor control handouts from [employer] websites    GOALS: Goals reviewed with patient? Yes  SHORT TERM GOALS: Target date: 01/15/22  Pt will be independent with [facility] to increase coordination as needed for ADLs and vocation tasks. Baseline: Goal status: MET - 01/14/22  2.  Pt will verbalize understanding of energy conservation strategies and be able to verbalize 2 strategies that she is incorporating into IADLs to increase activity tolerance. Baseline:  Goal status: MET - 01/14/22  3.  Pt will demonstrate improved typing speed by 5 WPM while maintaining >95% accuracy to demonstrate increased coordination as needed for RTW. Baseline: (5 minutes): 47 wpm x 97% accuracy = 47 wpm on 12/12/21 Goal status: NOT MET - unable to assess on 9/28 due to program shutting  down during typing task   LONG TERM GOALS: Target date: 02/12/22  Pt will demonstrate improved fine motor coordination for ADLs as evidenced by decreasing 9 hole peg test score for BUE by 5 secs Baseline: Right: 23.91 sec; Left: 24.57 sec Goal status: NOT MET - goal was set a bit lofty, pt showed improvements from eval and returning within 1 sec to time from hospital R: 20 and L: 23 on 02/11/22  2.  Pt will demonstrate improved typing speed by 10 WPM while maintaining >95% accuracy to demonstrate increased coordination as needed for RTW. Baseline:  Goal status: MET -  63 WPM, 98% accuracy yielding 62 WPM on 01/26/22  3.  Pt will complete table top scanning/dual tasking activity at Mod I level. Baseline:  Goal status: MET- 02/11/22  4.  Pt will demonstrate and/or verbalize ability to sequence moderately challenging IADL/vocation task (banking, balancing check book/leger, taking phone messages) at Mod I level demonstrating good alternating attention. Baseline:  Goal status: MET - 02/11/22  5.  Pt will verbalize understanding of RTW recommendations and recognize when able to return to work functions. Baseline:  Goal status: MET - 02/11/22   ASSESSMENT:  CLINICAL IMPRESSION: Treatment session with focus on coordination with typing and cognitive dual tasking as needed for increased engagement in return to work tasks. Lengthy discussion about next steps in regards to return to work and modified work day as well as adaptive strategies to aid pt with return to work when appropriate.  Pt pleased with progress and having spoken with boss who is receptive to modifications upon return to work.  Pt demonstrating improved coordination and ability to engage in dual tasking with minimal typing errors. Pt continues to report good days and bad days which impact her coordination as well as processing speed.  Pt reports understanding of recommendations to d/c from OT at this time, and reports understanding  of recommendations to seek referral if needs arise in the future.    PERFORMANCE DEFICITS in functional skills including IADLs, coordination, strength, FMC, balance, body mechanics, endurance, cardiopulmonary status limiting function, and UE functional use, cognitive skills including attention, energy/drive, and sequencing, and psychosocial skills including environmental adaptation and routines and behaviors.   IMPAIRMENTS are limiting patient from IADLs, rest and sleep, and work.   COMORBIDITIES may have co-morbidities  that affects occupational performance. Patient will benefit from skilled OT to address above impairments and improve overall function.  MODIFICATION OR ASSISTANCE TO COMPLETE EVALUATION: Min-Moderate modification of tasks or assist with assess necessary to complete an evaluation.  OT OCCUPATIONAL PROFILE AND HISTORY: Detailed assessment: Review of records and additional review of physical, cognitive, psychosocial history related to current functional performance.  CLINICAL DECISION MAKING: Moderate - several treatment options, min-mod task modification necessary  REHAB POTENTIAL: Good  EVALUATION COMPLEXITY: Low    PLAN: OT FREQUENCY: 1x/week  OT DURATION: 8 weeks  PLANNED INTERVENTIONS: self care/ADL training, therapeutic exercise, therapeutic activity, balance training, functional mobility training, moist heat, cryotherapy, patient/family education, cognitive remediation/compensation, psychosocial skills training, energy conservation, coping strategies training, and DME and/or AE instructions  RECOMMENDED OTHER SERVICES: N/A  CONSULTED AND AGREED WITH PLAN OF CARE: Patient and family member/caregiver  PLAN FOR NEXT SESSION: D/C from [place], Judson Roch, OTR/L 02/11/2022, 11:39 AM

## 2022-02-15 ENCOUNTER — Ambulatory Visit: Payer: BC Managed Care – PPO

## 2022-02-15 ENCOUNTER — Ambulatory Visit: Payer: BC Managed Care – PPO | Admitting: Occupational Therapy

## 2022-02-15 DIAGNOSIS — R278 Other lack of coordination: Secondary | ICD-10-CM | POA: Diagnosis not present

## 2022-02-15 DIAGNOSIS — R2681 Unsteadiness on feet: Secondary | ICD-10-CM

## 2022-02-15 DIAGNOSIS — M6281 Muscle weakness (generalized): Secondary | ICD-10-CM

## 2022-02-15 DIAGNOSIS — R2689 Other abnormalities of gait and mobility: Secondary | ICD-10-CM

## 2022-02-15 NOTE — Therapy (Signed)
OUTPATIENT PHYSICAL THERAPY NEURO TREATMENT   Patient Name: Kara [name] MRN: [id number] DOB:1985-09-27, 36 y.o., female Today's Date: 02/15/2022   PCP: Lawerance Cruel, MD REFERRING PROVIDER: Courtney Heys, MD   PT End of Session - 02/15/22 [phone]     Visit Number 13    Number of Visits 19    Date for PT Re-Evaluation 03/02/22    Authorization Type BCBS    PT Start Time 0840    PT Stop Time 0925    PT Time Calculation (min) 45 min    Activity Tolerance Patient tolerated treatment well    Behavior During Therapy [facility] for tasks assessed/performed                 Past Medical History:  Diagnosis Date   Alcoholism in remission ([place])    per pt in remission since 2017   Anemia    AR (allergic rhinitis)    allergist-- dr Neldon Mc   Biliary dyskinesia    Diastolic CHF ([place])    followd by dr Mamie Nick. Johnsie Cancel   Environmental allergies    GERD (gastroesophageal reflux disease)    Headache    History of acute pancreatitis    alcoholic pancreatitis ,  8101 and 2017   History of sepsis 06/12/2016   with CAP   History of syncope    History of thrombosis 01/29/2015   splenic vein thrombosis-- treated with coumadin--- readmitted for abd. hematoma due to coumadin treated with IR embolization of the IMA branches   Hypertension    Polyneuropathy    Prolonged QT syndrome    cardiologist-- dr Johnsie Cancel (notes in epic)   Severe persistent asthma    pulmonologist-- dr dr Bernita Raisin (notes in epic)   Past Surgical History:  Procedure Laterality Date   CHOLECYSTECTOMY N/A 06/21/2018   Procedure: LAPAROSCOPIC CHOLECYSTECTOMY;  Surgeon: Coralie Keens, MD;  Location: [facility];  Service: General;  Laterality: N/A;   ESOPHAGOGASTRODUODENOSCOPY N/A 07/17/2014   Procedure: ESOPHAGOGASTRODUODENOSCOPY (EGD);  Surgeon: Teena Irani, MD;  Location: [facility];  Service: Endoscopy;  Laterality: N/A;   TYMPANOSTOMY TUBE PLACEMENT Bilateral 11 MONTHS OLD   VIDEO BRONCHOSCOPY Bilateral 10/08/2016    Procedure: VIDEO BRONCHOSCOPY WITH FLUORO;  Surgeon: Marshell Garfinkel, MD;  Location: [facility];  Service: Cardiopulmonary;  Laterality: Bilateral;   Patient Active Problem List   Diagnosis Date Noted   Foot drop, left 01/18/2022   Long Q-T syndrome 12/28/2021   Hypomagnesemia 12/15/2021   Chronic diarrhea 12/15/2021   Fracture of multiple ribs 12/15/2021   Anoxic brain injury ([place]) 12/09/2021   Cardiac arrest ([place]) 11/27/2021   Seizure disorder ([place]) 11/27/2021   Chronic anoxic encephalopathy ([place]) 11/27/2021   Tracheostomy status ([place]) 11/27/2021   AKI (acute kidney injury) ([place])    Seizure ([place])    Altered mental status    Cardiac arrest ([place]) 11/13/2021   Acute respiratory failure (H[facility])    Prolonged Q-T interval on ECG    Hypokalemia    Encephalopathy    Cellulitis of right lower extremity 11/04/2021   Septic prepatellar bursitis of right knee 11/04/2021   Generalized tonic-clonic seizure ([place]) 01/20/2021   Ataxia 01/20/2021   Depression 08/05/2020   Acute on chronic pancreatitis ([place]) 11/06/2019   Elevated CK    Malnutrition of moderate degree 10/23/2019   Pancreatic pseudocyst    Pancreatic cyst    Fatty liver    Other allergic rhinitis 05/03/2019   Food allergy 05/03/2019   Tobacco smoker, less than 10 cigarettes per day  05/03/2019   Asthma, severe persistent, well-controlled 11/11/2017   Lung infiltrate    Hyponatremia 08/27/2016   Hyperglycemia 08/27/2016   Acute on chronic respiratory failure with hypoxia ([place]) 08/26/2016   HCAP (healthcare-associated pneumonia) 07/31/2016   Tobacco abuse 07/31/2016   Respiratory failure with hypoxia ([place]) 07/31/2016   CAP (community acquired pneumonia) 06/13/2016   Asthma exacerbation 06/12/2016   Alcohol abuse, in remission 06/12/2016   Sepsis ([place]) 06/12/2016   Chronic pancreatitis ([place]) 09/09/2015   Gastroesophageal reflux disease 09/09/2015   Benign essential HTN 09/09/2015   FUO (fever of unknown origin)     Abdominal hematoma 02/10/2015   Warfarin-induced coagulopathy ([place]) 02/10/2015   Hypertension 02/10/2015   Hypoalbuminemia due to protein-calorie malnutrition ([place]) 76/16/0737   Alcoholic pancreatitis 10/62/6948   Acute blood loss anemia    Intra-abdominal hematoma 02/09/2015   Pancreatitis, acute    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   Acute pancreatitis 01/29/2015   Sinus tachycardia 01/29/2015   Leukocytosis 01/29/2015   AKI (acute kidney injury) ([place]) 01/29/2015   Insomnia 10/29/2014   Polyneuropathy 09/02/2014   History of alcohol abuse 09/02/2014   Edema 09/02/2014   Neck pain 09/02/2014   Chronic lower back pain 09/02/2014   Numbness of foot 08/02/2014   Hand numbness 08/02/2014   Ataxic gait 08/02/2014   Proximal leg weakness 08/02/2014   Constipation    Abdominal pain 07/16/2014   Hypothermia 07/16/2014   Upper abdominal pain    Abdominal pain, epigastric 07/06/2014   Obesity (BMI 30-39.9) 07/06/2014   Chronic diastolic heart failure ([place]) 07/06/2014   Syncope 04/08/2014   Prolonged QT interval 04/08/2014   Hypokalemia 04/08/2014   Elevated LFTs 04/08/2014    ONSET DATE: 11/13/2021  REFERRING DIAG:  G93.1 (ICD-10-CM) - Anoxic brain damage, not elsewhere classified  G40.909 (ICD-10-CM) - Epilepsy, unspecified, not intractable, without status epilepticus    THERAPY DIAG:  Muscle weakness (generalized)  Unsteadiness on feet  Other abnormalities of gait and mobility  Rationale for Evaluation and Treatment Rehabilitation  SUBJECTIVE:                                                                                                                                                                                              SUBJECTIVE STATEMENT: Feeling a shaky this AM    Pt accompanied by: self and family member (Mom)  PERTINENT HISTORY: Pt is a 36 y.o. female admitted 11/13/21 with v-fib/VT cardiac arrest, pt noted to have some posturing and possible  myoclonic activity; UDS negative. EEG without seizure activity. MRI concerning for restricted diffusion in the bilateral posterior frontal, parietal, and superior occipital cortex,  concerning for cytotoxic edema from hypoxic ischemic injury. ETT 7/28-8/3. PMH includes HF, HLD, seizure, ETOH use, asthma, anxiety, chronic pain, long QT syndrome.  She participated in inpatient rehab 12/09/21-12/15/21.  PAIN:  No; reports mild "tenderness in R ankle"  PRECAUTIONS: Fall and Other: broken ribs from CPR  PATIENT GOALS Walking, leg strength, and balance is my main concern  OBJECTIVE:  BP at start: 104/79 mmHg, 87 bpm, 97% BP mid-session after exertion: 114/73, 92 bpm  TODAY'S TREATMENT: 02/15/22 Activity Comments  Vitals assessment   Walk on tip toes 85 ft  Standing heel raise 1x10 Neutral, ER, IR  Walking outside -over various surfaces, curbs, ramps, maintaining conversation  Walking carrying box Level ground 2x 85 ft, up/down 15 steps with single HR and reciprocal pattern  Pt education -Regarding POC details and discussion of barriers and difficulty in mobility and ADL -education regarding performance and benefits of low-impact cardiovascular exercise for endurance  Balance HEP review -on foam and pt brief teach-back of different static and dynamic movements    TODAY'S TREATMENT: 02/04/22 Activity Comments  Standing heel raise 1x10 Neutral, ER, IR  Step downs 2x10 4" step   Coordination drills -clockwise/anti-clockwise toe taps on dyna disc -foot elevated on rocker+dyna and ball toss -semi-tandem on foam and dyna disc x 15 sec -feet together on foam, EC--3x15 sec multiple LOB -standing on foam feet together postural perturbations  Resisted walking 15# 5x backward to forward 15# 5x forward to backward -repeated with 20#              HOME EXERCISE PROGRAM Last updated: 02/02/22 Access Code: [email or web address] URL: [email or web address] Date: 02/02/2022 Prepared by: [facility]  Exercises - Standing Gastroc Stretch on Step  - 1-2 x daily - 7 x weekly - 3 reps - 30 sec hold - Seated Ankle Dorsiflexion with Anchored Resistance  - 1 x daily - 5 x weekly - 2 sets - 10 reps - Forward Step Up  - 1 x daily - 5 x weekly - 2 sets - 10 reps - Side Stepping with Resistance at Ankles and Counter Support  - 1 x daily - 5 x weekly - 1 sets - 3-5 reps - Forward and Backward Monster Walk with Resistance at Ankles and Counter Support  - 1 x daily - 5 x weekly - 1 sets - 3-5 reps - Jumping Rope  - 1 x daily - 7 x weekly - 3 sets - 15-30 hold - Seated Ankle Eversion with Resistance  - 1 x daily - 5 x weekly - 2 sets - 10 reps - Single Leg Stance on Foam Pad  - 1 x daily - 5 x weekly - 2-3 sets - 30 sec hold   PATIENT EDUCATION: Education details: HEP review and consolidation, discussion on remaining goals and limitations, POC Person educated: Patient Education method: Explanation, Demonstration, Tactile cues, Verbal cues, and Handouts Education comprehension: verbalized understanding and returned demonstration     _________________________________________________________________________   Below measures were taken at time of initial evaluation unless otherwise specified:  LOWER EXTREMITY ROM:   WFL except ankle dorsiflexion:  see below  Active  Right Eval Left Eval  Hip flexion    Hip extension    Hip abduction    Hip adduction    Hip internal rotation    Hip external rotation    Knee flexion    Knee extension    Ankle dorsiflexion Active:  -4 from neutral; passive +  10 Active:  -4, passive +4  Ankle plantarflexion    Ankle inversion    Ankle eversion     (Blank rows = not tested)  LOWER EXTREMITY MMT:    MMT Right Eval Left Eval  Hip flexion 3+ 3+  Hip extension    Hip abduction 4 4  Hip adduction 4 4  Hip internal rotation    Hip external rotation    Knee flexion 4 4  Knee extension 4 4  Ankle dorsiflexion  3- 3-  Ankle plantarflexion    Ankle inversion    Ankle eversion    (Blank rows = not tested)   FUNCTIONAL TESTs:  5 times sit to stand: 15.29 sec no UE support, wide BOS Timed up and go (TUG): 11.44 sec  M-CTSIB  Condition 1: Firm Surface, EO 30 Sec, Normal Sway  Condition 2: Firm Surface, EC 30 Sec, Mild Sway  Condition 3: Foam Surface, EO 30 Sec, Mild Sway  Condition 4: Foam Surface, EC 30 Sec, Mild Sway    SLS:  R and L:  10 seconds; Trendelenburg noted on RLE Tandem:  30 seconds each foot position; Trendelenburg with RLE in posterior position. Gait velocity:  10.22 sec in 20 ft:  1.96 ft/sec FGA:  23/30 (Scores <22/30 indicate increased fall risk)  GOALS: Goals reviewed with patient? Yes  SHORT TERM GOALS: Target date: 01/15/2022  Pt will be independent with HEP for improved strength, balance, gait. Baseline: Goal status: MET  2.  Pt will improve 5x sit<>stand to less than or equal to 12.5 sec to demonstrate improved functional strength and transfer efficiency. Baseline: 15.29 sec with wide BOS> 11.65 sec 01/12/2022 Goal status: GOAL MET  3.  Pt will verbalize understanding of fall prevention in home environment.  Baseline:  Goal status: GOAL MET  LONG TERM GOALS: Target date: 03/02/2022  Pt will be independent with HEP for improved strength, balance, transfers, and gait. Baseline:  Goal status: IN PROGRESS  2.  Pt will improve 5x sit<>stand to less than or equal to 10 sec to demonstrate improved functional strength and transfer efficiency. Baseline: 15.29 sec; (01/26/22) 9.56 sec Goal status: MET  3.  Pt will improve FGA score to at least 26/30 to decrease fall risk. Baseline: 23/30; (01/26/22) 28/30 Goal status: MET  4.  Pt will improve gait velocity to at least 2.62 ft/sec for improved gait efficiency and safety. Baseline: 1.96 ft/sec; (01/26/22) 3.28 ft/sec Goal status: MET  5. Patient to reports ability to navigate stairs with alternating reciprocal  pattern while holding an object to gain access to laundry room with good stability and confidence.  Baseline: reports hesitation and occasional step-to pattern  Goal status: MET  6.  Pt will demonstrate good stability and confidence when ambulating on outdoor surfaces while maintaining conversation.  Baseline: reports hesitancy  Goal status: on-going    ASSESSMENT:  CLINICAL IMPRESSION: Patient able to demo improved ability to navigate steps with reciprocal pattern whilst carrying a box to simulate object/laundry basket and able to do so with reciprocal pattern and single HR. Pt reports a history of drop-foot which she endorses from diagnosis of polyneuropathy, no instances of foot drop noted during gait trials and no catching foot/toe with curb/stair negotiation. Pt able to perform teach-back of HEP balance activities using foam surfaces.  Therapist advised additional activities of head movements and eyes closed condition to improve balance reactions and reduce risk for falls. Patient appears to be quite nervous/anxious regarding D/C from therapy services despite  meeting majority of goals and notes her balance disturbance often comes after an episode of anxiety or crowded room conditions such as in her small kitchen at home.  Discussed at length with patient regarding progress and need for a long-term plan that can be maintained with consistency at D/C for max benefit and carryover.  OBJECTIVE IMPAIRMENTS Abnormal gait, decreased balance, decreased mobility, difficulty walking, decreased ROM, decreased strength, and postural dysfunction.   REHAB POTENTIAL: Good  PLAN: PT FREQUENCY: 2x/week  PT DURATION: 4 weeks   PLANNED INTERVENTIONS: Therapeutic exercises, Therapeutic activity, Neuromuscular re-education, Balance training, Gait training, Patient/Family education, Self Care, Stair training, Orthotic/Fit training, and Manual therapy  PLAN FOR NEXT SESSION: balance on uneven surfaces,  stairs and curbs; work on resisted ankle strengthening   8:37 AM, 02/15/22 M. Sherlyn Lees, PT, DPT Physical Therapist- [place] Office Number: [phone]    [place] [facility] [address] [place], [place] [address] Phone # [phone] Fax # [phone]

## 2022-02-16 ENCOUNTER — Ambulatory Visit: Payer: BC Managed Care – PPO | Admitting: Physical Therapy

## 2022-02-16 ENCOUNTER — Encounter: Payer: BC Managed Care – PPO | Admitting: Occupational Therapy

## 2022-02-16 ENCOUNTER — Ambulatory Visit: Payer: BC Managed Care – PPO

## 2022-02-16 DIAGNOSIS — M6281 Muscle weakness (generalized): Secondary | ICD-10-CM

## 2022-02-16 DIAGNOSIS — R2681 Unsteadiness on feet: Secondary | ICD-10-CM

## 2022-02-16 DIAGNOSIS — R278 Other lack of coordination: Secondary | ICD-10-CM | POA: Diagnosis not present

## 2022-02-16 DIAGNOSIS — R2689 Other abnormalities of gait and mobility: Secondary | ICD-10-CM

## 2022-02-16 NOTE — Therapy (Signed)
OUTPATIENT PHYSICAL THERAPY NEURO TREATMENT   Patient Name: Kara [name] MRN: [id number] DOB:[birth year], 36 y.o., female Today's Date: 02/16/2022   PCP: Lawerance Cruel, MD REFERRING PROVIDER: Courtney Heys, MD   PT End of Session - 02/16/22 1403     Visit Number 14    Number of Visits 19    Date for PT Re-Evaluation 03/02/22    Authorization Type BCBS    PT Start Time 1400    PT Stop Time 4235    PT Time Calculation (min) 45 min    Activity Tolerance Patient tolerated treatment well    Behavior During Therapy [facility] for tasks assessed/performed                 Past Medical History:  Diagnosis Date   Alcoholism in remission ([place])    per pt in remission since 2017   Anemia    AR (allergic rhinitis)    allergist-- dr Neldon Mc   Biliary dyskinesia    Diastolic CHF ([place])    followd by dr Mamie Nick. Johnsie Cancel   Environmental allergies    GERD (gastroesophageal reflux disease)    Headache    History of acute pancreatitis    alcoholic pancreatitis ,  3614 and 2017   History of sepsis 06/12/2016   with CAP   History of syncope    History of thrombosis 01/29/2015   splenic vein thrombosis-- treated with coumadin--- readmitted for abd. hematoma due to coumadin treated with IR embolization of the IMA branches   Hypertension    Polyneuropathy    Prolonged QT syndrome    cardiologist-- dr Johnsie Cancel (notes in epic)   Severe persistent asthma    pulmonologist-- dr dr Bernita Raisin (notes in epic)   Past Surgical History:  Procedure Laterality Date   CHOLECYSTECTOMY N/A 06/21/2018   Procedure: LAPAROSCOPIC CHOLECYSTECTOMY;  Surgeon: Coralie Keens, MD;  Location: [facility];  Service: General;  Laterality: N/A;   ESOPHAGOGASTRODUODENOSCOPY N/A 07/17/2014   Procedure: ESOPHAGOGASTRODUODENOSCOPY (EGD);  Surgeon: Teena Irani, MD;  Location: [facility];  Service: Endoscopy;  Laterality: N/A;   TYMPANOSTOMY TUBE PLACEMENT Bilateral 11 MONTHS OLD   VIDEO BRONCHOSCOPY Bilateral 10/08/2016    Procedure: VIDEO BRONCHOSCOPY WITH FLUORO;  Surgeon: Marshell Garfinkel, MD;  Location: [place];  Service: Cardiopulmonary;  Laterality: Bilateral;   Patient Active Problem List   Diagnosis Date Noted   Foot drop, left 01/18/2022   Long Q-T syndrome 12/28/2021   Hypomagnesemia 12/15/2021   Chronic diarrhea 12/15/2021   Fracture of multiple ribs 12/15/2021   Anoxic brain injury ([place]) 12/09/2021   Cardiac arrest ([place]) 11/27/2021   Seizure disorder ([place]) 11/27/2021   Chronic anoxic encephalopathy ([place]) 11/27/2021   Tracheostomy status ([place]) 11/27/2021   AKI (acute kidney injury) ([place])    Seizure ([place])    Altered mental status    Cardiac arrest ([place]) 11/13/2021   Acute respiratory failure ([facility])    Prolonged Q-T interval on ECG    Hypokalemia    Encephalopathy    Cellulitis of right lower extremity 11/04/2021   Septic prepatellar bursitis of right knee 11/04/2021   Generalized tonic-clonic seizure ([place]) 01/20/2021   Ataxia 01/20/2021   Depression 08/05/2020   Acute on chronic pancreatitis ([place]) 11/06/2019   Elevated CK    Malnutrition of moderate degree 10/23/2019   Pancreatic pseudocyst    Pancreatic cyst    Fatty liver    Other allergic rhinitis 05/03/2019   Food allergy 05/03/2019   Tobacco smoker, less than 10 cigarettes per day  05/03/2019   Asthma, severe persistent, well-controlled 11/11/2017   Lung infiltrate    Hyponatremia 08/27/2016   Hyperglycemia 08/27/2016   Acute on chronic respiratory failure with hypoxia ([place]) 08/26/2016   HCAP (healthcare-associated pneumonia) 07/31/2016   Tobacco abuse 07/31/2016   Respiratory failure with hypoxia ([place]) 07/31/2016   CAP (community acquired pneumonia) 06/13/2016   Asthma exacerbation 06/12/2016   Alcohol abuse, in remission 06/12/2016   Sepsis ([place]) 06/12/2016   Chronic pancreatitis ([place]) 09/09/2015   Gastroesophageal reflux disease 09/09/2015   Benign essential HTN 09/09/2015   FUO (fever of unknown origin)     Abdominal hematoma 02/10/2015   Warfarin-induced coagulopathy ([place]) 02/10/2015   Hypertension 02/10/2015   Hypoalbuminemia due to protein-calorie malnutrition ([place]) 56/31/4970   Alcoholic pancreatitis 26/37/8588   Acute blood loss anemia    Intra-abdominal hematoma 02/09/2015   Pancreatitis, acute    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   Acute pancreatitis 01/29/2015   Sinus tachycardia 01/29/2015   Leukocytosis 01/29/2015   AKI (acute kidney injury) ([place]) 01/29/2015   Insomnia 10/29/2014   Polyneuropathy 09/02/2014   History of alcohol abuse 09/02/2014   Edema 09/02/2014   Neck pain 09/02/2014   Chronic lower back pain 09/02/2014   Numbness of foot 08/02/2014   Hand numbness 08/02/2014   Ataxic gait 08/02/2014   Proximal leg weakness 08/02/2014   Constipation    Abdominal pain 07/16/2014   Hypothermia 07/16/2014   Upper abdominal pain    Abdominal pain, epigastric 07/06/2014   Obesity (BMI 30-39.9) 07/06/2014   Chronic diastolic heart failure ([place]) 07/06/2014   Syncope 04/08/2014   Prolonged QT interval 04/08/2014   Hypokalemia 04/08/2014   Elevated LFTs 04/08/2014    ONSET DATE: 11/13/2021  REFERRING DIAG:  G93.1 (ICD-10-CM) - Anoxic brain damage, not elsewhere classified  G40.909 (ICD-10-CM) - Epilepsy, unspecified, not intractable, without status epilepticus    THERAPY DIAG:  Muscle weakness (generalized)  Unsteadiness on feet  Other abnormalities of gait and mobility  Rationale for Evaluation and Treatment Rehabilitation  SUBJECTIVE:                                                                                                                                                                                              SUBJECTIVE STATEMENT: Feeling much better today   Pt accompanied by: self and family member (Mom)  PERTINENT HISTORY: Pt is a 36 y.o. female admitted 11/13/21 with v-fib/VT cardiac arrest, pt noted to have some posturing and possible  myoclonic activity; UDS negative. EEG without seizure activity. MRI concerning for restricted diffusion in the bilateral posterior frontal, parietal, and superior occipital cortex, concerning for  cytotoxic edema from hypoxic ischemic injury. ETT 7/28-8/3. PMH includes HF, HLD, seizure, ETOH use, asthma, anxiety, chronic pain, long QT syndrome.  She participated in inpatient rehab 12/09/21-12/15/21.  PAIN:  No; reports mild "tenderness in R ankle"  PRECAUTIONS: Fall and Other: broken ribs from CPR  PATIENT GOALS Walking, leg strength, and balance is my main concern  OBJECTIVE:   TODAY'S TREATMENT: 02/16/22 Activity Comments  Walk on tip toes 85 ft  Standing heel raise 1x10 Neutral, ER, IR  March with opposite knee touch, tandem walk, retrowalk 4x20 ft  Standing on foam -throw/catch playground ball 2x20 -eyes closed 1x30 sec -eyes closed head turns 5x  Treadmill training X 10 min varying speed for increased step length        TODAY'S TREATMENT: 02/15/22 Activity Comments  Vitals assessment   Walk on tip toes 85 ft  Standing heel raise 1x10 Neutral, ER, IR  Walking outside -over various surfaces, curbs, ramps, maintaining conversation  Walking carrying box Level ground 2x 85 ft, up/down 15 steps with single HR and reciprocal pattern  Pt education -Regarding POC details and discussion of barriers and difficulty in mobility and ADL -education regarding performance and benefits of low-impact cardiovascular exercise for endurance  Balance HEP review -on foam and pt brief teach-back of different static and dynamic movements        HOME EXERCISE PROGRAM Last updated: 02/02/22 Access Code: [email or web address] URL: [email or web address] Date: 02/02/2022 Prepared by: [facility]  Exercises - Standing Gastroc Stretch on Step  - 1-2 x daily - 7 x weekly - 3 reps - 30 sec hold - Seated Ankle Dorsiflexion with Anchored Resistance  - 1 x daily - 5 x  weekly - 2 sets - 10 reps - Forward Step Up  - 1 x daily - 5 x weekly - 2 sets - 10 reps - Side Stepping with Resistance at Ankles and Counter Support  - 1 x daily - 5 x weekly - 1 sets - 3-5 reps - Forward and Backward Monster Walk with Resistance at Ankles and Counter Support  - 1 x daily - 5 x weekly - 1 sets - 3-5 reps - Jumping Rope  - 1 x daily - 7 x weekly - 3 sets - 15-30 hold - Seated Ankle Eversion with Resistance  - 1 x daily - 5 x weekly - 2 sets - 10 reps - Single Leg Stance on Foam Pad  - 1 x daily - 5 x weekly - 2-3 sets - 30 sec hold   PATIENT EDUCATION: Education details: HEP review and consolidation, discussion on remaining goals and limitations, POC Person educated: Patient Education method: Explanation, Demonstration, Tactile cues, Verbal cues, and Handouts Education comprehension: verbalized understanding and returned demonstration     _________________________________________________________________________   Below measures were taken at time of initial evaluation unless otherwise specified:  LOWER EXTREMITY ROM:   WFL except ankle dorsiflexion:  see below  Active  Right Eval Left Eval  Hip flexion    Hip extension    Hip abduction    Hip adduction    Hip internal rotation    Hip external rotation    Knee flexion    Knee extension    Ankle dorsiflexion Active:  -4 from neutral; passive + 10 Active:  -4, passive +4  Ankle plantarflexion    Ankle inversion    Ankle eversion     (Blank rows = not tested)  LOWER EXTREMITY MMT:  MMT Right Eval Left Eval  Hip flexion 3+ 3+  Hip extension    Hip abduction 4 4  Hip adduction 4 4  Hip internal rotation    Hip external rotation    Knee flexion 4 4  Knee extension 4 4  Ankle dorsiflexion 3- 3-  Ankle plantarflexion    Ankle inversion    Ankle eversion    (Blank rows = not tested)   FUNCTIONAL TESTs:  5 times sit to stand: 15.29 sec no UE support, wide BOS Timed up and go (TUG): 11.44  sec  M-CTSIB  Condition 1: Firm Surface, EO 30 Sec, Normal Sway  Condition 2: Firm Surface, EC 30 Sec, Mild Sway  Condition 3: Foam Surface, EO 30 Sec, Mild Sway  Condition 4: Foam Surface, EC 30 Sec, Mild Sway    SLS:  R and L:  10 seconds; Trendelenburg noted on RLE Tandem:  30 seconds each foot position; Trendelenburg with RLE in posterior position. Gait velocity:  10.22 sec in 20 ft:  1.96 ft/sec FGA:  23/30 (Scores <22/30 indicate increased fall risk)  GOALS: Goals reviewed with patient? Yes  SHORT TERM GOALS: Target date: 01/15/2022  Pt will be independent with HEP for improved strength, balance, gait. Baseline: Goal status: MET  2.  Pt will improve 5x sit<>stand to less than or equal to 12.5 sec to demonstrate improved functional strength and transfer efficiency. Baseline: 15.29 sec with wide BOS> 11.65 sec 01/12/2022 Goal status: GOAL MET  3.  Pt will verbalize understanding of fall prevention in home environment.  Baseline:  Goal status: GOAL MET  LONG TERM GOALS: Target date: 03/02/2022  Pt will be independent with HEP for improved strength, balance, transfers, and gait. Baseline:  Goal status: IN PROGRESS  2.  Pt will improve 5x sit<>stand to less than or equal to 10 sec to demonstrate improved functional strength and transfer efficiency. Baseline: 15.29 sec; (01/26/22) 9.56 sec Goal status: MET  3.  Pt will improve FGA score to at least 26/30 to decrease fall risk. Baseline: 23/30; (01/26/22) 28/30 Goal status: MET  4.  Pt will improve gait velocity to at least 2.62 ft/sec for improved gait efficiency and safety. Baseline: 1.96 ft/sec; (01/26/22) 3.28 ft/sec Goal status: MET  5. Patient to reports ability to navigate stairs with alternating reciprocal pattern while holding an object to gain access to laundry room with good stability and confidence.  Baseline: reports hesitation and occasional step-to pattern  Goal status: MET  6.  Pt will demonstrate good  stability and confidence when ambulating on outdoor surfaces while maintaining conversation.  Baseline: reports hesitancy  Goal status: MET   ASSESSMENT:  CLINICAL IMPRESSION: Pt notes feeling improved symptoms today.  No sway noted standing on foam and performing dynamic activities and/or eyes closed. No LOB experienced throughout session during high level balance activities, difficulty in coordinating jumping/hopping attempts but pt voices concerns of fear as a limitation. Initiated with treadmill training for improved endurance and consistent in stride length.  Overall pt has done quite well and discussion with patient regarding barriers to D/C and pt notes that she feels a fluctuating performance but this is primarily attributed to anxiety and difficulty in integrating this recent experience into her life's context.  Discussed benefits of mental health counseling and these therapeutic modalities to address these deficits.  Discussed holding remaining sessions and encouraged pt to trial independent HEP and use of calendering strategy to improve discipline and consistency.  OBJECTIVE IMPAIRMENTS Abnormal gait, decreased  balance, decreased mobility, difficulty walking, decreased ROM, decreased strength, and postural dysfunction.   REHAB POTENTIAL: Good  PLAN: PT FREQUENCY: 2x/week  PT DURATION: 4 weeks   PLANNED INTERVENTIONS: Therapeutic exercises, Therapeutic activity, Neuromuscular re-education, Balance training, Gait training, Patient/Family education, Self Care, Stair training, Orthotic/Fit training, and Manual therapy  PLAN FOR NEXT SESSION: hold visits and D/C pending patient response to independent performance  2:03 PM, 02/16/22 M. Sherlyn Lees, PT, DPT Physical Therapist- [place] Office Number: [phone]    Manuel Garcia at [facility] [address] [place], [place] [address] Phone # [phone] Fax # [phone]

## 2022-02-18 ENCOUNTER — Ambulatory Visit: Payer: BC Managed Care – PPO

## 2022-02-23 ENCOUNTER — Ambulatory Visit: Payer: BC Managed Care – PPO

## 2022-02-25 ENCOUNTER — Ambulatory Visit: Payer: BC Managed Care – PPO

## 2022-03-01 ENCOUNTER — Ambulatory Visit: Payer: BC Managed Care – PPO | Admitting: Cardiovascular Disease

## 2022-03-16 ENCOUNTER — Other Ambulatory Visit: Payer: Self-pay | Admitting: Physical Medicine and Rehabilitation

## 2022-03-26 ENCOUNTER — Inpatient Hospital Stay (HOSPITAL_COMMUNITY)
Admission: EM | Admit: 2022-03-26 | Discharge: 2022-04-19 | DRG: 432 | Disposition: E | Payer: BC Managed Care – PPO | Attending: Internal Medicine | Admitting: Internal Medicine

## 2022-03-26 ENCOUNTER — Emergency Department (HOSPITAL_COMMUNITY): Payer: BC Managed Care – PPO

## 2022-03-26 ENCOUNTER — Other Ambulatory Visit: Payer: Self-pay

## 2022-03-26 ENCOUNTER — Encounter (HOSPITAL_COMMUNITY): Payer: Self-pay

## 2022-03-26 DIAGNOSIS — K7011 Alcoholic hepatitis with ascites: Secondary | ICD-10-CM

## 2022-03-26 DIAGNOSIS — Z83719 Family history of colon polyps, unspecified: Secondary | ICD-10-CM

## 2022-03-26 DIAGNOSIS — R1011 Right upper quadrant pain: Secondary | ICD-10-CM

## 2022-03-26 DIAGNOSIS — Z930 Tracheostomy status: Secondary | ICD-10-CM

## 2022-03-26 DIAGNOSIS — R7989 Other specified abnormal findings of blood chemistry: Secondary | ICD-10-CM | POA: Diagnosis present

## 2022-03-26 DIAGNOSIS — F10988 Alcohol use, unspecified with other alcohol-induced disorder: Secondary | ICD-10-CM | POA: Diagnosis not present

## 2022-03-26 DIAGNOSIS — K766 Portal hypertension: Secondary | ICD-10-CM | POA: Diagnosis present

## 2022-03-26 DIAGNOSIS — K7031 Alcoholic cirrhosis of liver with ascites: Secondary | ICD-10-CM

## 2022-03-26 DIAGNOSIS — I959 Hypotension, unspecified: Secondary | ICD-10-CM | POA: Diagnosis not present

## 2022-03-26 DIAGNOSIS — K7290 Hepatic failure, unspecified without coma: Secondary | ICD-10-CM

## 2022-03-26 DIAGNOSIS — K861 Other chronic pancreatitis: Secondary | ICD-10-CM | POA: Diagnosis present

## 2022-03-26 DIAGNOSIS — Z79899 Other long term (current) drug therapy: Secondary | ICD-10-CM

## 2022-03-26 DIAGNOSIS — R791 Abnormal coagulation profile: Secondary | ICD-10-CM | POA: Diagnosis not present

## 2022-03-26 DIAGNOSIS — Z515 Encounter for palliative care: Secondary | ICD-10-CM

## 2022-03-26 DIAGNOSIS — R109 Unspecified abdominal pain: Secondary | ICD-10-CM | POA: Diagnosis present

## 2022-03-26 DIAGNOSIS — K860 Alcohol-induced chronic pancreatitis: Secondary | ICD-10-CM | POA: Diagnosis not present

## 2022-03-26 DIAGNOSIS — M545 Low back pain, unspecified: Secondary | ICD-10-CM | POA: Diagnosis present

## 2022-03-26 DIAGNOSIS — K7682 Hepatic encephalopathy: Secondary | ICD-10-CM

## 2022-03-26 DIAGNOSIS — R17 Unspecified jaundice: Secondary | ICD-10-CM | POA: Diagnosis not present

## 2022-03-26 DIAGNOSIS — E872 Acidosis, unspecified: Secondary | ICD-10-CM | POA: Diagnosis present

## 2022-03-26 DIAGNOSIS — Z833 Family history of diabetes mellitus: Secondary | ICD-10-CM

## 2022-03-26 DIAGNOSIS — K769 Liver disease, unspecified: Secondary | ICD-10-CM | POA: Diagnosis not present

## 2022-03-26 DIAGNOSIS — E669 Obesity, unspecified: Secondary | ICD-10-CM | POA: Diagnosis present

## 2022-03-26 DIAGNOSIS — Z6835 Body mass index (BMI) 35.0-35.9, adult: Secondary | ICD-10-CM

## 2022-03-26 DIAGNOSIS — E870 Hyperosmolality and hypernatremia: Secondary | ICD-10-CM | POA: Diagnosis present

## 2022-03-26 DIAGNOSIS — G931 Anoxic brain damage, not elsewhere classified: Secondary | ICD-10-CM | POA: Diagnosis present

## 2022-03-26 DIAGNOSIS — R188 Other ascites: Secondary | ICD-10-CM | POA: Diagnosis not present

## 2022-03-26 DIAGNOSIS — J189 Pneumonia, unspecified organism: Secondary | ICD-10-CM | POA: Diagnosis not present

## 2022-03-26 DIAGNOSIS — G629 Polyneuropathy, unspecified: Secondary | ICD-10-CM | POA: Diagnosis present

## 2022-03-26 DIAGNOSIS — K219 Gastro-esophageal reflux disease without esophagitis: Secondary | ICD-10-CM | POA: Diagnosis present

## 2022-03-26 DIAGNOSIS — R6510 Systemic inflammatory response syndrome (SIRS) of non-infectious origin without acute organ dysfunction: Secondary | ICD-10-CM | POA: Diagnosis present

## 2022-03-26 DIAGNOSIS — J9601 Acute respiratory failure with hypoxia: Secondary | ICD-10-CM | POA: Diagnosis not present

## 2022-03-26 DIAGNOSIS — Z66 Do not resuscitate: Secondary | ICD-10-CM | POA: Diagnosis not present

## 2022-03-26 DIAGNOSIS — J4550 Severe persistent asthma, uncomplicated: Secondary | ICD-10-CM | POA: Diagnosis present

## 2022-03-26 DIAGNOSIS — R000 Tachycardia, unspecified: Secondary | ICD-10-CM | POA: Diagnosis present

## 2022-03-26 DIAGNOSIS — Z8674 Personal history of sudden cardiac arrest: Secondary | ICD-10-CM | POA: Diagnosis not present

## 2022-03-26 DIAGNOSIS — K625 Hemorrhage of anus and rectum: Secondary | ICD-10-CM

## 2022-03-26 DIAGNOSIS — K7040 Alcoholic hepatic failure without coma: Secondary | ICD-10-CM | POA: Diagnosis not present

## 2022-03-26 DIAGNOSIS — G8929 Other chronic pain: Secondary | ICD-10-CM | POA: Diagnosis present

## 2022-03-26 DIAGNOSIS — K7460 Unspecified cirrhosis of liver: Secondary | ICD-10-CM

## 2022-03-26 DIAGNOSIS — K922 Gastrointestinal hemorrhage, unspecified: Secondary | ICD-10-CM | POA: Diagnosis present

## 2022-03-26 DIAGNOSIS — Z91010 Allergy to peanuts: Secondary | ICD-10-CM

## 2022-03-26 DIAGNOSIS — Z7984 Long term (current) use of oral hypoglycemic drugs: Secondary | ICD-10-CM

## 2022-03-26 DIAGNOSIS — Z7951 Long term (current) use of inhaled steroids: Secondary | ICD-10-CM

## 2022-03-26 DIAGNOSIS — I5032 Chronic diastolic (congestive) heart failure: Secondary | ICD-10-CM | POA: Diagnosis present

## 2022-03-26 DIAGNOSIS — D684 Acquired coagulation factor deficiency: Secondary | ICD-10-CM | POA: Diagnosis present

## 2022-03-26 DIAGNOSIS — J80 Acute respiratory distress syndrome: Secondary | ICD-10-CM | POA: Diagnosis present

## 2022-03-26 DIAGNOSIS — Z886 Allergy status to analgesic agent status: Secondary | ICD-10-CM

## 2022-03-26 DIAGNOSIS — F32A Depression, unspecified: Secondary | ICD-10-CM | POA: Diagnosis present

## 2022-03-26 DIAGNOSIS — I82890 Acute embolism and thrombosis of other specified veins: Secondary | ICD-10-CM | POA: Diagnosis present

## 2022-03-26 DIAGNOSIS — N179 Acute kidney failure, unspecified: Secondary | ICD-10-CM | POA: Diagnosis present

## 2022-03-26 DIAGNOSIS — G40909 Epilepsy, unspecified, not intractable, without status epilepticus: Secondary | ICD-10-CM

## 2022-03-26 DIAGNOSIS — K7469 Other cirrhosis of liver: Secondary | ICD-10-CM | POA: Diagnosis not present

## 2022-03-26 DIAGNOSIS — F419 Anxiety disorder, unspecified: Secondary | ICD-10-CM | POA: Diagnosis present

## 2022-03-26 DIAGNOSIS — Z86718 Personal history of other venous thrombosis and embolism: Secondary | ICD-10-CM

## 2022-03-26 DIAGNOSIS — T501X5A Adverse effect of loop [high-ceiling] diuretics, initial encounter: Secondary | ICD-10-CM | POA: Diagnosis not present

## 2022-03-26 DIAGNOSIS — Z7189 Other specified counseling: Secondary | ICD-10-CM | POA: Diagnosis not present

## 2022-03-26 DIAGNOSIS — D696 Thrombocytopenia, unspecified: Secondary | ICD-10-CM | POA: Diagnosis not present

## 2022-03-26 DIAGNOSIS — Z91018 Allergy to other foods: Secondary | ICD-10-CM

## 2022-03-26 DIAGNOSIS — I110 Hypertensive heart disease with heart failure: Secondary | ICD-10-CM | POA: Diagnosis present

## 2022-03-26 DIAGNOSIS — F1021 Alcohol dependence, in remission: Secondary | ICD-10-CM | POA: Diagnosis present

## 2022-03-26 DIAGNOSIS — Z1152 Encounter for screening for COVID-19: Secondary | ICD-10-CM | POA: Diagnosis not present

## 2022-03-26 DIAGNOSIS — K852 Alcohol induced acute pancreatitis without necrosis or infection: Secondary | ICD-10-CM | POA: Diagnosis present

## 2022-03-26 DIAGNOSIS — R161 Splenomegaly, not elsewhere classified: Secondary | ICD-10-CM | POA: Diagnosis not present

## 2022-03-26 DIAGNOSIS — E876 Hypokalemia: Secondary | ICD-10-CM | POA: Diagnosis not present

## 2022-03-26 DIAGNOSIS — F1010 Alcohol abuse, uncomplicated: Secondary | ICD-10-CM | POA: Diagnosis not present

## 2022-03-26 DIAGNOSIS — Z8249 Family history of ischemic heart disease and other diseases of the circulatory system: Secondary | ICD-10-CM

## 2022-03-26 DIAGNOSIS — Z888 Allergy status to other drugs, medicaments and biological substances status: Secondary | ICD-10-CM

## 2022-03-26 DIAGNOSIS — D6959 Other secondary thrombocytopenia: Secondary | ICD-10-CM | POA: Diagnosis present

## 2022-03-26 HISTORY — DX: Fever, unspecified: R50.9

## 2022-03-26 LAB — COMPREHENSIVE METABOLIC PANEL
ALT: 39 U/L (ref 0–44)
AST: 115 U/L — ABNORMAL HIGH (ref 15–41)
Albumin: 1.6 g/dL — ABNORMAL LOW (ref 3.5–5.0)
Alkaline Phosphatase: 369 U/L — ABNORMAL HIGH (ref 38–126)
Anion gap: 13 (ref 5–15)
BUN: 8 mg/dL (ref 6–20)
CO2: 21 mmol/L — ABNORMAL LOW (ref 22–32)
Calcium: 8 mg/dL — ABNORMAL LOW (ref 8.9–10.3)
Chloride: 101 mmol/L (ref 98–111)
Creatinine, Ser: 0.96 mg/dL (ref 0.44–1.00)
GFR, Estimated: >60 mL/min (ref >60)
Glucose, Bld: 119 mg/dL — ABNORMAL HIGH (ref 70–99)
Potassium: 3.5 mmol/L (ref 3.5–5.1)
Sodium: 135 mmol/L (ref 135–145)
Total Bilirubin: 26.1 mg/dL (ref 0.3–1.2)
Total Protein: 6.2 g/dL — ABNORMAL LOW (ref 6.5–8.1)

## 2022-03-26 LAB — I-STAT BETA HCG BLOOD, ED (MC, WL, AP ONLY): I-stat hCG, quantitative: <5 m[IU]/mL (ref <5)

## 2022-03-26 LAB — CBC WITH DIFFERENTIAL/PLATELET
Abs Immature Granulocytes: 0.29 10*3/uL — ABNORMAL HIGH (ref 0.00–0.07)
Basophils Absolute: 0 10*3/uL (ref 0.0–0.1)
Basophils Relative: 0 %
Eosinophils Absolute: 0 10*3/uL (ref 0.0–0.5)
Eosinophils Relative: 0 %
HCT: 34.9 % — ABNORMAL LOW (ref 36.0–46.0)
Hemoglobin: 12.1 g/dL (ref 12.0–15.0)
Immature Granulocytes: 2 %
Lymphocytes Relative: 8 %
Lymphs Abs: 1.4 10*3/uL (ref 0.7–4.0)
MCH: 32.1 pg (ref 26.0–34.0)
MCHC: 34.7 g/dL (ref 30.0–36.0)
MCV: 92.6 fL (ref 80.0–100.0)
Monocytes Absolute: 1.9 10*3/uL — ABNORMAL HIGH (ref 0.1–1.0)
Monocytes Relative: 10 %
Neutro Abs: 14.9 10*3/uL — ABNORMAL HIGH (ref 1.7–7.7)
Neutrophils Relative %: 80 %
Platelets: 171 10*3/uL (ref 150–400)
RBC: 3.77 MIL/uL — ABNORMAL LOW (ref 3.87–5.11)
RDW: 21.3 % — ABNORMAL HIGH (ref 11.5–15.5)
WBC: 18.6 10*3/uL — ABNORMAL HIGH (ref 4.0–10.5)
nRBC: 0 % (ref 0.0–0.2)

## 2022-03-26 LAB — PROTIME-INR
INR: 3 — ABNORMAL HIGH (ref 0.8–1.2)
Prothrombin Time: 31 seconds — ABNORMAL HIGH (ref 11.4–15.2)

## 2022-03-26 LAB — AMMONIA: Ammonia: 67 umol/L — ABNORMAL HIGH (ref 9–35)

## 2022-03-26 LAB — LIPASE, BLOOD: Lipase: 62 U/L — ABNORMAL HIGH (ref 11–51)

## 2022-03-26 LAB — LACTIC ACID, PLASMA: Lactic Acid, Venous: 1.3 mmol/L (ref 0.5–1.9)

## 2022-03-26 MED ORDER — METOPROLOL TARTRATE 12.5 MG HALF TABLET
12.5000 mg | ORAL_TABLET | Freq: Two times a day (BID) | ORAL | Status: DC
  Administered 2022-03-26 – 2022-03-29 (×4): 12.5 mg via ORAL
  Filled 2022-03-26 (×5): qty 1

## 2022-03-26 MED ORDER — SODIUM CHLORIDE 0.9% FLUSH
3.0000 mL | Freq: Two times a day (BID) | INTRAVENOUS | Status: DC
  Administered 2022-03-26 – 2022-04-01 (×9): 3 mL via INTRAVENOUS

## 2022-03-26 MED ORDER — PANCRELIPASE (LIP-PROT-AMYL) 20000-63000 UNITS PO CPEP: 2.0000 | ORAL_CAPSULE | Freq: Three times a day (TID) | ORAL | Status: DC

## 2022-03-26 MED ORDER — SODIUM CHLORIDE 0.9 % IV BOLUS
500.0000 mL | Freq: Once | INTRAVENOUS | Status: AC
  Administered 2022-03-26: 500 mL via INTRAVENOUS

## 2022-03-26 MED ORDER — ALBUTEROL SULFATE (2.5 MG/3ML) 0.083% IN NEBU: 2.5000 mg | INHALATION_SOLUTION | Freq: Four times a day (QID) | RESPIRATORY_TRACT | Status: DC | PRN

## 2022-03-26 MED ORDER — GABAPENTIN 300 MG PO CAPS
900.0000 mg | ORAL_CAPSULE | Freq: Two times a day (BID) | ORAL | Status: DC
  Administered 2022-03-26 – 2022-03-30 (×9): 900 mg via ORAL
  Filled 2022-03-26 (×9): qty 3

## 2022-03-26 MED ORDER — LIDOCAINE 5 % EX PTCH: 1.0000 | MEDICATED_PATCH | Freq: Every day | CUTANEOUS | Status: DC | PRN

## 2022-03-26 MED ORDER — POLYVINYL ALCOHOL 1.4 % OP SOLN
1.0000 [drp] | Freq: Every day | OPHTHALMIC | Status: DC | PRN
  Filled 2022-03-26: qty 15

## 2022-03-26 MED ORDER — OXYCODONE HCL 5 MG PO TABS
5.0000 mg | ORAL_TABLET | Freq: Once | ORAL | Status: AC
  Administered 2022-03-26: 5 mg via ORAL
  Filled 2022-03-26: qty 1

## 2022-03-26 MED ORDER — METRONIDAZOLE 500 MG/100ML IV SOLN
500.0000 mg | Freq: Two times a day (BID) | INTRAVENOUS | Status: DC
  Administered 2022-03-26 – 2022-03-28 (×4): 500 mg via INTRAVENOUS
  Filled 2022-03-26 (×4): qty 100

## 2022-03-26 MED ORDER — ALBUTEROL SULFATE HFA 108 (90 BASE) MCG/ACT IN AERS: 2.0000 | INHALATION_SPRAY | Freq: Four times a day (QID) | RESPIRATORY_TRACT | Status: DC | PRN

## 2022-03-26 MED ORDER — LACOSAMIDE 50 MG PO TABS
100.0000 mg | ORAL_TABLET | Freq: Two times a day (BID) | ORAL | Status: DC
  Administered 2022-03-26 – 2022-03-30 (×9): 100 mg via ORAL
  Filled 2022-03-26 (×9): qty 2

## 2022-03-26 MED ORDER — BUDESONIDE 0.25 MG/2ML IN SUSP
0.2500 mg | Freq: Two times a day (BID) | RESPIRATORY_TRACT | Status: DC
  Administered 2022-03-26 – 2022-03-31 (×9): 0.25 mg via RESPIRATORY_TRACT
  Filled 2022-03-26 (×11): qty 2

## 2022-03-26 MED ORDER — HYDROMORPHONE HCL 1 MG/ML IJ SOLN
1.0000 mg | INTRAMUSCULAR | Status: DC | PRN
  Administered 2022-03-26 – 2022-03-31 (×18): 1 mg via INTRAVENOUS
  Filled 2022-03-26 (×18): qty 1

## 2022-03-26 MED ORDER — OXYCODONE HCL 5 MG PO TABS
7.5000 mg | ORAL_TABLET | Freq: Three times a day (TID) | ORAL | Status: DC | PRN
  Administered 2022-03-27 – 2022-03-28 (×4): 7.5 mg via ORAL
  Filled 2022-03-26 (×4): qty 2

## 2022-03-26 MED ORDER — IOHEXOL 350 MG/ML SOLN
75.0000 mL | Freq: Once | INTRAVENOUS | Status: AC | PRN
  Administered 2022-03-26: 75 mL via INTRAVENOUS

## 2022-03-26 MED ORDER — THIAMINE MONONITRATE 100 MG PO TABS
100.0000 mg | ORAL_TABLET | Freq: Every day | ORAL | Status: DC
  Administered 2022-03-26 – 2022-03-30 (×5): 100 mg via ORAL
  Filled 2022-03-26 (×5): qty 1

## 2022-03-26 MED ORDER — POLYETHYLENE GLYCOL 3350 17 G PO PACK: 17.0000 g | PACK | Freq: Every day | ORAL | Status: DC | PRN

## 2022-03-26 MED ORDER — PANCRELIPASE (LIP-PROT-AMYL) 12000-38000 UNITS PO CPEP
24000.0000 [IU] | ORAL_CAPSULE | Freq: Three times a day (TID) | ORAL | Status: DC
  Administered 2022-03-27 – 2022-03-29 (×6): 24000 [IU] via ORAL
  Filled 2022-03-26 (×15): qty 2

## 2022-03-26 MED ORDER — SODIUM CHLORIDE 0.9 % IV SOLN
2.0000 g | Freq: Every day | INTRAVENOUS | Status: DC
  Administered 2022-03-26 – 2022-03-30 (×5): 2 g via INTRAVENOUS
  Filled 2022-03-26 (×5): qty 20

## 2022-03-26 MED ORDER — HYDROXYZINE HCL 10 MG PO TABS
10.0000 mg | ORAL_TABLET | Freq: Four times a day (QID) | ORAL | Status: DC | PRN
  Administered 2022-03-26 – 2022-03-28 (×2): 10 mg via ORAL
  Filled 2022-03-26 (×2): qty 1

## 2022-03-26 MED ORDER — HYDROMORPHONE HCL 1 MG/ML IJ SOLN
1.0000 mg | Freq: Once | INTRAMUSCULAR | Status: AC
  Administered 2022-03-26: 1 mg via INTRAVENOUS
  Filled 2022-03-26: qty 1

## 2022-03-26 MED ORDER — SODIUM CHLORIDE 0.9 % IV SOLN: INTRAVENOUS | Status: AC

## 2022-03-26 MED ORDER — METOCLOPRAMIDE HCL 5 MG/ML IJ SOLN: 5.0000 mg | Freq: Four times a day (QID) | INTRAMUSCULAR | Status: DC | PRN

## 2022-03-26 MED ORDER — MAGNESIUM OXIDE -MG SUPPLEMENT 400 (240 MG) MG PO TABS
400.0000 mg | ORAL_TABLET | Freq: Every day | ORAL | Status: DC
  Administered 2022-03-26 – 2022-03-30 (×5): 400 mg via ORAL
  Filled 2022-03-26 (×5): qty 1

## 2022-03-26 MED ORDER — METOCLOPRAMIDE HCL 5 MG/ML IJ SOLN
5.0000 mg | Freq: Once | INTRAMUSCULAR | Status: AC
  Administered 2022-03-26: 5 mg via INTRAVENOUS
  Filled 2022-03-26: qty 2

## 2022-03-26 NOTE — ED Notes (Signed)
Taken to Progressive unit at this time with this RN in NAD.

## 2022-03-26 NOTE — ED Provider Triage Note (Signed)
Emergency Medicine Provider Triage Evaluation Note  Kara [name] , a 36 y.o. female  was evaluated in triage.  Pt complains of right upper quadrant abdominal pain for the last 1 week.  Patient reports that pain is not persistent since this time.  The patient is also experiencing nausea, vomiting, diarrhea, fevers.  Patient with complex medical history.  The patient also endorses drinking history, states that she quit drinking 6 years ago, denies any history of liver failure.  The patient is extremely jaundiced on examination with scleral icterus.  Patient wearing oxygen, denies wearing oxygen at baseline.  The patient reports that she takes the treatments for her asthma.  Review of Systems  Positive:  Negative:   Physical Exam  BP 111/86 (BP Location: Right Arm)   Pulse (!) 115   Temp 98.9 F (37.2 C) (Oral)   Resp (!) 44   SpO2 99%  Gen:   Awake, no distress   Resp:  Normal effort  MSK:   Moves extremities without difficulty  Other:  Clear lung sounds.  Right upper quadrant pain.  Abdomen distended.  Medical Decision Making  Medically screening exam initiated at 2:05 PM.  Appropriate orders placed.  Breelle Hollywood was informed that the remainder of the evaluation will be completed by another provider, this initial triage assessment does not replace that evaluation, and the importance of remaining in the ED until their evaluation is complete.     Al Decant, PA-C 03/24/2022 1406

## 2022-03-26 NOTE — ED Provider Notes (Signed)
[place] EMERGENCY DEPARTMENT Provider Note   CSN: [id number] Arrival date & time: 04/14/2022  1331     History {Add pertinent medical, surgical, social history, OB history to HPI:1} Chief Complaint  Patient presents with   Abdominal Pain    Kara [name] is a 36 y.o. female with PMH significant for CHF, splenic vein thrombosis, chronic pancreatitis, cardiac arrest in 11/2021, presenting with concerns for abdominal pain, nausea, vomiting, and yellow discoloration of skin that started earlier this week. She reports that she began feeling poorly last week. She started with cough and congestion. She dates her husband noted that she started to look yellow earlier this week.  She states she had pain in her belly (gesturing to her right upper quadrant) since this week as well.  She endorses vomiting and diarrhea.  She states that she has not been able to make it to the bathroom before having a bowel movement today.  She denies blood in her vomit or stool.  She notes that she has had a rash on her chest.  She is unsure if she has had fevers, however she does endorse feeling hot and having chills.  She has a history of prior cholecystectomy.  She reports that she often has pain for pancreatitis, however states this feels different.  She denies dysuria, frequency, urgency, vaginal discharge, vaginal bleeding, vaginal pain.  She feels certain she is not pregnant.  She is open to hCG test.       Home Medications Prior to Admission medications   Medication Sig Start Date End Date Taking? Authorizing Provider  albuterol (VENTOLIN HFA) 108 (90 Base) MCG/ACT inhaler Inhale 2 puffs into the lungs every 6 (six) hours as needed for shortness of breath. 03/02/22  Yes [provider]  atorvastatin (LIPITOR) 10 MG tablet TAKE 1 TABLET(10 MG) BY MOUTH AT BEDTIME Patient taking differently: Take 10 mg by mouth every evening. 01/26/22  Yes Conte, Tessa N, PA-C  budesonide (PULMICORT)  0.25 MG/2ML nebulizer solution Take 2 mLs (0.25 mg total) by nebulization 2 (two) times daily. 11/26/21  Yes Charlynne Cousins, MD  busPIRone (BUSPAR) 5 MG tablet Take 1-2 tablets (5-10 mg total) by mouth 3 (three) times daily as needed (anxiety). For anxiety- take 1-2 tabs as needed- 01/18/22  Yes Lovorn, Jinny Blossom, MD  cetirizine (ZYRTEC) 10 MG tablet TAKE 1 TABLET(10 MG) BY MOUTH DAILY Patient taking differently: Take 10 mg by mouth daily. 01/07/22  Yes Lovorn, Jinny Blossom, MD  diphenhydrAMINE (BENADRYL) 25 MG tablet Take 25 mg by mouth every 6 (six) hours as needed for itching.   Yes [provider]  folic acid (FOLVITE) 1 MG tablet TAKE 1 TABLET(1 MG) BY MOUTH DAILY Patient taking differently: Take 1 mg by mouth daily. 01/11/22  Yes Lovorn, Jinny Blossom, MD  gabapentin (NEURONTIN) 300 MG capsule TAKE 3 CAPSULES(900 MG) BY MOUTH TWICE DAILY Patient taking differently: Take 900 mg by mouth 2 (two) times daily. 01/11/22  Yes Lovorn, Jinny Blossom, MD  Lacosamide 100 MG TABS Take 1 tablet (100 mg total) by mouth 2 (two) times daily. 12/16/21  Yes Sater, Nanine Means, MD  lidocaine (LIDODERM) 5 % Place 1 patch onto the skin daily. Remove & Discard patch within 12 hours or as directed by MD Patient taking differently: Place 1 patch onto the skin daily as needed (for pain). 12/15/21  Yes Love, Ivan Anchors, PA-C  magnesium oxide (MAG-OX) 400 (240 Mg) MG tablet TAKE 2 TABLETS(800 MG) BY MOUTH TWICE DAILY Patient taking differently:  Take 400 mg by mouth daily. 03/16/22  Yes Lovorn, Megan, MD  melatonin 3 MG TABS tablet Take 3 mg by mouth at bedtime.   Yes [provider]  metFORMIN (GLUCOPHAGE) 500 MG tablet Take 1 tablet (500 mg total) by mouth 2 (two) times daily with a meal. 12/15/21  Yes Love, Ivan Anchors, PA-C  metoprolol tartrate (LOPRESSOR) 25 MG tablet TAKE 1 TABLET(25 MG) BY MOUTH TWICE DAILY Patient taking differently: Take 12.5 mg by mouth 2 (two) times daily. 01/26/22  Yes Conte, Tessa N, PA-C  montelukast  (SINGULAIR) 10 MG tablet TAKE ONE TABLET BY MOUTH AT BEDTIME Patient taking differently: Take 10 mg by mouth at bedtime. 12/13/19  Yes Ambs, Kathrine Cords, FNP  naloxone [facility]) nasal spray 4 mg/0.1 mL SMARTSIG:Both Nares 12/17/21  Yes [provider]  omeprazole (PRILOSEC) 40 MG capsule TAKE 1 CAPSULE(40 MG) BY MOUTH DAILY Patient taking differently: Take 40 mg by mouth daily. 01/08/20  Yes Ambs, Kathrine Cords, FNP  oxyCODONE (ROXICODONE) 15 MG immediate release tablet Take 0.5 tablets (7.5 mg total) by mouth every 8 (eight) hours as needed for pain. This is an increase from  5 mg to 7.5 mg three times a day as needed. Ninety pills were filled on 10/21/21 prior to your admission on 07/19. Now that you are taking less you should have almost a month supply. Patient taking differently: Take 10 mg by mouth every 8 (eight) hours as needed for pain. This is an increase from  5 mg to 7.5 mg three times a day as needed. Ninety pills were filled on 10/21/21 prior to your admission on 07/19. Now that you are taking less you should have almost a month supply. 12/15/21  Yes Love, Ivan Anchors, PA-C  Pancrelipase, Lip-Prot-Amyl, (ZENPEP) 20000-63000 units CPEP Take 2 capsules by mouth 3 (three) times daily. 12/15/21  Yes Love, Ivan Anchors, PA-C  polycarbophil (FIBERCON) 625 MG tablet Take 1 tablet (625 mg total) by mouth 2 (two) times daily. 12/15/21  Yes Love, Ivan Anchors, PA-C  polyvinyl alcohol (LIQUIFILM TEARS) 1.4 % ophthalmic solution Place 1 drop into both eyes as needed for dry eyes. Patient taking differently: Place 1 drop into both eyes daily as needed for dry eyes. 11/26/21  Yes Charlynne Cousins, MD  Probiotic Product (DIGESTIVE ADVANTAGE) CAPS Take 1 capsule by mouth daily.   Yes [provider]  saccharomyces boulardii (FLORASTOR) 250 MG capsule Take 1 capsule (250 mg total) by mouth 2 (two) times daily. 12/15/21  Yes Love, Ivan Anchors, PA-C  sertraline (ZOLOFT) 100 MG tablet Take 1 tablet (100 mg total) by mouth  daily. 12/15/21  Yes Love, Ivan Anchors, PA-C  thiamine (VITAMIN B-1) 100 MG tablet Take 1 tablet (100 mg total) by mouth daily. 12/15/21  Yes Love, Ivan Anchors, PA-C  traZODone (DESYREL) 150 MG tablet Take 0.5 tablets (75 mg total) by mouth at bedtime. 12/15/21  Yes Love, Ivan Anchors, PA-C  blood glucose meter kit and supplies KIT Dispense based on patient and insurance preference. Use up to four times daily as directed. 12/15/21   Love, Ivan Anchors, PA-C  EPINEPHrine (EPIPEN 2-PAK) 0.3 mg/0.3 mL IJ SOAJ injection Inject 0.3 mLs (0.3 mg total) into the muscle once as needed (for severe allergic reaction). 07/11/18   Kozlow, Donnamarie Poag, MD      Allergies    Maxzide [hydrochlorothiazide w-triamterene], Nsaids, Other, Peanut butter flavor, Peanuts [peanut oil], Tylenol [acetaminophen], and Wellbutrin [bupropion]    Review of Systems   Review  of Systems  Gastrointestinal:  Positive for abdominal pain.    Physical Exam Updated Vital Signs BP 103/72   Pulse (!) 114   Temp 98.9 F (37.2 C) (Oral)   Resp (!) 24   SpO2 100%  Physical Exam Constitutional:      Appearance: She is ill-appearing.     Comments: Uncomfortable appearing, intermittently tearful  HENT:     Head: Normocephalic and atraumatic.  Eyes:     General: Scleral icterus present.     Extraocular Movements: Extraocular movements intact.     Pupils: Pupils are equal, round, and reactive to light.  Cardiovascular:     Rate and Rhythm: Regular rhythm. Tachycardia present.     Heart sounds: Normal heart sounds.  Pulmonary:     Breath sounds: No wheezing or rales.     Comments: On nasal cannula Abdominal:     Palpations: There is hepatomegaly. There is no fluid wave.     Tenderness: There is abdominal tenderness in the right upper quadrant and epigastric area. There is no rebound.  Skin:    Coloration: Skin is jaundiced.     Comments: Scattered nonblanching papules on chest and shoulders, scattered lesions consistent with spider angioma on  chest and shoulders  Neurological:     Mental Status: She is alert and oriented to person, place, and time.  Psychiatric:        Mood and Affect: Mood is anxious.     ED Results / Procedures / Treatments   Labs (all labs ordered are listed, but only abnormal results are displayed) Labs Reviewed  CBC WITH DIFFERENTIAL/PLATELET - Abnormal; Notable for the following components:      Result Value   WBC 18.6 (*)    RBC 3.77 (*)    HCT 34.9 (*)    RDW 21.3 (*)    Neutro Abs 14.9 (*)    Monocytes Absolute 1.9 (*)    Abs Immature Granulocytes 0.29 (*)    All other components within normal limits  CULTURE, BLOOD (ROUTINE X 2)  CULTURE, BLOOD (ROUTINE X 2)  LACTIC ACID, PLASMA  COMPREHENSIVE METABOLIC PANEL  LIPASE, BLOOD  URINALYSIS, ROUTINE W REFLEX MICROSCOPIC  LACTIC ACID, PLASMA  PROTIME-INR  AMMONIA  I-STAT BETA HCG BLOOD, ED ([facility], [facility], [facility] ONLY)    EKG None  Radiology No results found.  Procedures Procedures  {Document cardiac monitor, telemetry assessment procedure when appropriate:1}  Medications Ordered in ED Medications  metoCLOPramide (REGLAN) injection 5 mg (has no administration in time range)  oxyCODONE (Oxy IR/ROXICODONE) immediate release tablet 5 mg (5 mg Oral Given 03/21/2022 1507)    ED Course/ Medical Decision Making/ A&P                           Medical Decision Making Amount and/or Complexity of Data Reviewed External Data Reviewed: labs and ECG. Labs: ordered. Decision-making details documented in ED Course. Radiology: ordered and independent interpretation performed. Decision-making details documented in ED Course. ECG/medicine tests: ordered and independent interpretation performed. Decision-making details documented in ED Course.  Risk Prescription drug management.   Patient presents as above.  Differential diagnosis includes but is not limited to hepatic failure, acute hepatitis, choledocholithiasis, hepatic failure, chronic  pancreatitis.  Workup initiated in triage prior to my evaluation.  On my evaluation, additional labs ordered.  Patient given pain medication.  I personally reviewed the EKG.  The EKG showed sinus tachycardia, QTc 430 ms.  No ST elevations,  no ST depressions.  T wave inversion in lead V1, aVR.  Patient's lab are notable for leukocytosis 18.6, elevated neutrophils at 14.9, hemoglobin within normal limits, platelets within normal limits.  hCG negative.  Lipase slightly elevated at 62.  Patient's CMP is notable for severely elevated total bilirubin at 26.1.  Alk phos also elevated at 369.  Patient's AST is 115 and ALT is 39.  Her creatinine and BUN are within normal limits.  Sodium, potassium, chloride within normal limits.  Bicarb slightly low at 21.  Lactic acid within normal limits.  Patient's ammonia slightly elevated at 67.  Her PT and INR are elevated.  INR 3.0.  On chart review, patient previously was diagnosed Coumadin, I do not think she is taking this any longer.  I confirmed with the patient that she does not***take Coumadin any longer.  Patient's CT abdomen pelvis showed some fat stranding around the pancreas which was increased compared to prior.  Patient's lipase is overall not consistent with acute pancreatitis.  She also has a low-attenuation lesion on the right lobe of her liver.  I discussed the patient's findings with gastroenterology.  While it is feasible that patient may have some obstructive pathology causing her symptoms, it is significantly more likely that she has intrinsic liver pathology such as alcoholic hepatitis, acute liver injury, or something else of this nature.  MRCP would be relatively low yield at this time.  Gastroenterology will continue to follow this patient.  Direct bilirubin pending. I discussed the patient with gastroenterology.  {Document critical care time when appropriate:1} {Document review of labs and clinical decision tools ie heart score, Chads2Vasc2  etc:1}  {Document your independent review of radiology images, and any outside records:1} {Document your discussion with family members, caretakers, and with consultants:1} {Document social determinants of health affecting pt's care:1} {Document your decision making why or why not admission, treatments were needed:1} Final Clinical Impression(s) / ED Diagnoses Final diagnoses:  None    Rx / DC Orders ED Discharge Orders     None

## 2022-03-26 NOTE — ED Triage Notes (Addendum)
Patient with history of a cardiac arrest in August this year and liver failure BIB [facility] from home with complaint of RUQ abdominal pain, emesis, and diarrhea for the last two days. Patient is alert, severely jaundiced, oriented,a and in n apparent distress at this time. Received LR and fentanyl from EMS PTA.  EMS Vitals RR 30-50 breaths per minute HR 120 BP 116/7

## 2022-03-26 NOTE — H&P (Signed)
History and Physical   [name] VPX:[id number] DOB: [birth year] DOA: 04/07/2022  PCP: Lawerance Cruel, MD   Patient coming from: Home  Chief Complaint: Right upper quadrant pain, vomiting, diarrhea  HPI: Kara [name] is a 36 y.o. female with medical history significant of chronic pancreatitis, pancreatic pseudocyst, splenic vein thrombosis, intra-abdominal hematoma, warfarin induced coagulopathy, seizures, tachycardia, tracheostomy, hypertension, GERD, depression, QT prolongation, neuropathy, obesity, diastolic CHF, asthma, chronic pain, cardiac arrest and anoxic brain injury, ataxia presenting with right upper quadrant pain, emesis, diarrhea.  Patient reports noticing some changes to her skin with a yellow discoloration starting earlier this week.  She also reports some preceding fatigue in the week prior when visiting family during Thanksgiving but denies any significant fevers however she states is difficult to tell because she had some sensation of alternating warm and cold. For the past couple days she has had the above symptoms of right upper quadrant pain, emesis, diarrhea.  She states the pain is different from her typical chronic pancreatitis pain.  She is status post cholecystectomy.  She denies fevers, chills, chest pain, shortness of breath.  ED Course: Vital signs in the ED significant for heart rate in the 100s to 120s.  Respiratory rate in the 20s.  Blood pressure in the 462V to 035K systolic.  Lab workup included CMP with bicarb 21, glucose 118, calcium 8.0, protein 6.2, albumin 1.6, AST 115, alk phos 369, T. bili 26.1.  CBC with leukocytosis to 18.6.  PT 31 and INR 3.  Lipase 62.  Lactic acid normal.  Ammonia level 67.  Urinalysis pending.  D bili pending.  Blood cultures pending.  CT abdomen pelvis showed mild pancreatic fat stranding increased from previous possibly representing acute on chronic pancreatitis versus mesenteric edema.  Portal hypertensive changes noted.  New  liver lesion possibly a cyst noted with recommendation for MRI follow-up.  Patient received oxycodone, Dilaudid, Reglan, 500 cc of IV fluid in the ED.  GI consulted and will continue to follow concern for intra hepatic liver pathology such as acute hepatitis or acute liver injury or other.  Feel mass effect is less likely and minimal benefit from MRCP at this time.  Review of Systems: As per HPI otherwise all other systems reviewed and are negative.  Past Medical History:  Diagnosis Date   Alcoholism in remission ([place])    per pt in remission since 2017   Anemia    AR (allergic rhinitis)    allergist-- dr Neldon Mc   Biliary dyskinesia    CAP (community acquired pneumonia) 06/13/2016   Cardiac arrest ([place]) 09/38/1829   Diastolic CHF ([place])    followd by dr Edmonia James   Environmental allergies    FUO (fever of unknown origin)    Generalized tonic-clonic seizure ([place]) 01/20/2021   GERD (gastroesophageal reflux disease)    HCAP (healthcare-associated pneumonia) 07/31/2016   Headache    History of acute pancreatitis    alcoholic pancreatitis ,  9371 and 2017   History of sepsis 06/12/2016   with CAP   History of syncope    History of thrombosis 01/29/2015   splenic vein thrombosis-- treated with coumadin--- readmitted for abd. hematoma due to coumadin treated with IR embolization of the IMA branches   Hypertension    Hypokalemia 04/08/2014   Hypomagnesemia 12/15/2021   Hyponatremia 08/27/2016   Hypothermia 07/16/2014   Polyneuropathy    Prolonged QT syndrome    cardiologist-- dr Johnsie Cancel (notes in epic)   Respiratory failure with hypoxia ([place])  07/31/2016   Sepsis ([place]) 06/12/2016   Severe persistent asthma    pulmonologist-- dr dr Bernita Raisin (notes in epic)    Past Surgical History:  Procedure Laterality Date   CHOLECYSTECTOMY N/A 06/21/2018   Procedure: LAPAROSCOPIC CHOLECYSTECTOMY;  Surgeon: Coralie Keens, MD;  Location: [facility];  Service: General;  Laterality: N/A;    ESOPHAGOGASTRODUODENOSCOPY N/A 07/17/2014   Procedure: ESOPHAGOGASTRODUODENOSCOPY (EGD);  Surgeon: Teena Irani, MD;  Location: [facility];  Service: Endoscopy;  Laterality: N/A;   TYMPANOSTOMY TUBE PLACEMENT Bilateral 11 MONTHS OLD   VIDEO BRONCHOSCOPY Bilateral 10/08/2016   Procedure: VIDEO BRONCHOSCOPY WITH FLUORO;  Surgeon: Marshell Garfinkel, MD;  Location: [facility];  Service: Cardiopulmonary;  Laterality: Bilateral;    Social History  reports that she has never smoked. She has never used smokeless tobacco. She reports that she does not currently use alcohol. She reports that she does not currently use drugs.  Allergies  Allergen Reactions   Maxzide [Hydrochlorothiazide W-Triamterene] Nausea Only and Other (See Comments)    Dizziness    Nsaids Diarrhea and Nausea And Vomiting   Other Hives, Itching and Other (See Comments)    TREE NUT - itching, hives   Peanut Butter Flavor Hives and Itching   Peanuts [Peanut Oil] Hives and Itching   Tylenol [Acetaminophen] Diarrhea, Nausea And Vomiting and Other (See Comments)    Elevated LFT    Wellbutrin [Bupropion] Other (See Comments)    Lowered seizure threshold    Family History  Problem Relation Age of Onset   Heart failure Mother    Colon polyps Mother    Heart failure Father    Diabetes Father    Heart failure Maternal Grandfather    Colon cancer Neg Hx   Reviewed on admission  Prior to Admission medications   Medication Sig Start Date End Date Taking? Authorizing Provider  albuterol (VENTOLIN HFA) 108 (90 Base) MCG/ACT inhaler Inhale 2 puffs into the lungs every 6 (six) hours as needed for shortness of breath. 03/02/22  Yes [provider]  atorvastatin (LIPITOR) 10 MG tablet TAKE 1 TABLET(10 MG) BY MOUTH AT BEDTIME Patient taking differently: Take 10 mg by mouth every evening. 01/26/22  Yes Conte, Tessa N, PA-C  budesonide (PULMICORT) 0.25 MG/2ML nebulizer solution Take 2 mLs (0.25 mg total) by nebulization 2 (two)  times daily. 11/26/21  Yes Charlynne Cousins, MD  busPIRone (BUSPAR) 5 MG tablet Take 1-2 tablets (5-10 mg total) by mouth 3 (three) times daily as needed (anxiety). For anxiety- take 1-2 tabs as needed- 01/18/22  Yes Lovorn, Jinny Blossom, MD  cetirizine (ZYRTEC) 10 MG tablet TAKE 1 TABLET(10 MG) BY MOUTH DAILY Patient taking differently: Take 10 mg by mouth daily. 01/07/22  Yes Lovorn, Jinny Blossom, MD  diphenhydrAMINE (BENADRYL) 25 MG tablet Take 25 mg by mouth every 6 (six) hours as needed for itching.   Yes [provider]  folic acid (FOLVITE) 1 MG tablet TAKE 1 TABLET(1 MG) BY MOUTH DAILY Patient taking differently: Take 1 mg by mouth daily. 01/11/22  Yes Lovorn, Jinny Blossom, MD  gabapentin (NEURONTIN) 300 MG capsule TAKE 3 CAPSULES(900 MG) BY MOUTH TWICE DAILY Patient taking differently: Take 900 mg by mouth 2 (two) times daily. 01/11/22  Yes Lovorn, Jinny Blossom, MD  Lacosamide 100 MG TABS Take 1 tablet (100 mg total) by mouth 2 (two) times daily. 12/16/21  Yes Sater, Nanine Means, MD  lidocaine (LIDODERM) 5 % Place 1 patch onto the skin daily. Remove & Discard patch within 12 hours or as directed  by MD Patient taking differently: Place 1 patch onto the skin daily as needed (for pain). 12/15/21  Yes Love, Ivan Anchors, PA-C  magnesium oxide (MAG-OX) 400 (240 Mg) MG tablet TAKE 2 TABLETS(800 MG) BY MOUTH TWICE DAILY Patient taking differently: Take 400 mg by mouth daily. 03/16/22  Yes Lovorn, Megan, MD  melatonin 3 MG TABS tablet Take 3 mg by mouth at bedtime.   Yes [provider]  metFORMIN (GLUCOPHAGE) 500 MG tablet Take 1 tablet (500 mg total) by mouth 2 (two) times daily with a meal. 12/15/21  Yes Love, Ivan Anchors, PA-C  metoprolol tartrate (LOPRESSOR) 25 MG tablet TAKE 1 TABLET(25 MG) BY MOUTH TWICE DAILY Patient taking differently: Take 12.5 mg by mouth 2 (two) times daily. 01/26/22  Yes Conte, Tessa N, PA-C  montelukast (SINGULAIR) 10 MG tablet TAKE ONE TABLET BY MOUTH AT BEDTIME Patient taking  differently: Take 10 mg by mouth at bedtime. 12/13/19  Yes Ambs, Kathrine Cords, FNP  naloxone [facility]) nasal spray 4 mg/0.1 mL SMARTSIG:Both Nares 12/17/21  Yes [provider]  omeprazole (PRILOSEC) 40 MG capsule TAKE 1 CAPSULE(40 MG) BY MOUTH DAILY Patient taking differently: Take 40 mg by mouth daily. 01/08/20  Yes Ambs, Kathrine Cords, FNP  oxyCODONE (ROXICODONE) 15 MG immediate release tablet Take 0.5 tablets (7.5 mg total) by mouth every 8 (eight) hours as needed for pain. This is an increase from  5 mg to 7.5 mg three times a day as needed. Ninety pills were filled on 10/21/21 prior to your admission on 07/19. Now that you are taking less you should have almost a month supply. Patient taking differently: Take 10 mg by mouth every 8 (eight) hours as needed for pain. This is an increase from  5 mg to 7.5 mg three times a day as needed. Ninety pills were filled on 10/21/21 prior to your admission on 07/19. Now that you are taking less you should have almost a month supply. 12/15/21  Yes Love, Ivan Anchors, PA-C  Pancrelipase, Lip-Prot-Amyl, (ZENPEP) 20000-63000 units CPEP Take 2 capsules by mouth 3 (three) times daily. 12/15/21  Yes Love, Ivan Anchors, PA-C  polycarbophil (FIBERCON) 625 MG tablet Take 1 tablet (625 mg total) by mouth 2 (two) times daily. 12/15/21  Yes Love, Ivan Anchors, PA-C  polyvinyl alcohol (LIQUIFILM TEARS) 1.4 % ophthalmic solution Place 1 drop into both eyes as needed for dry eyes. Patient taking differently: Place 1 drop into both eyes daily as needed for dry eyes. 11/26/21  Yes Charlynne Cousins, MD  Probiotic Product (DIGESTIVE ADVANTAGE) CAPS Take 1 capsule by mouth daily.   Yes [provider]  saccharomyces boulardii (FLORASTOR) 250 MG capsule Take 1 capsule (250 mg total) by mouth 2 (two) times daily. 12/15/21  Yes Love, Ivan Anchors, PA-C  sertraline (ZOLOFT) 100 MG tablet Take 1 tablet (100 mg total) by mouth daily. 12/15/21  Yes Love, Ivan Anchors, PA-C  thiamine (VITAMIN B-1) 100 MG  tablet Take 1 tablet (100 mg total) by mouth daily. 12/15/21  Yes Love, Ivan Anchors, PA-C  traZODone (DESYREL) 150 MG tablet Take 0.5 tablets (75 mg total) by mouth at bedtime. 12/15/21  Yes Love, Ivan Anchors, PA-C  blood glucose meter kit and supplies KIT Dispense based on patient and insurance preference. Use up to four times daily as directed. 12/15/21   Love, Ivan Anchors, PA-C  EPINEPHrine (EPIPEN 2-PAK) 0.3 mg/0.3 mL IJ SOAJ injection Inject 0.3 mLs (0.3 mg total) into the muscle once as needed (for severe  allergic reaction). 07/11/18   Jiles Prows, MD    Physical Exam: Vitals:   04/05/2022 1800 03/31/2022 1815 03/25/2022 1830 04/06/2022 1845  BP: 113/71 106/64 114/74 117/79  Pulse: (!) 121 (!) 114 (!) 121 (!) 117  Resp: (!) 22 (!) 37 (!) 31 (!) 30  Temp:      TempSrc:      SpO2: 97% 100% 99% 98%  Weight:      Height:        Physical Exam Constitutional:      General: She is not in acute distress.    Appearance: Normal appearance.  HENT:     Head: Normocephalic and atraumatic.     Mouth/Throat:     Mouth: Mucous membranes are moist.     Pharynx: Oropharynx is clear.  Eyes:     General: Scleral icterus present.     Extraocular Movements: Extraocular movements intact.     Pupils: Pupils are equal, round, and reactive to light.  Cardiovascular:     Rate and Rhythm: Regular rhythm. Tachycardia present.     Pulses: Normal pulses.     Heart sounds: Normal heart sounds.  Pulmonary:     Effort: Pulmonary effort is normal. No respiratory distress.     Breath sounds: Normal breath sounds.  Abdominal:     General: Bowel sounds are normal. There is no distension.     Palpations: Abdomen is soft.     Tenderness: There is abdominal tenderness.  Musculoskeletal:        General: No swelling or deformity.  Skin:    General: Skin is warm and dry.     Coloration: Skin is jaundiced.  Neurological:     General: No focal deficit present.     Mental Status: Mental status is at baseline.    Labs on  Admission: I have personally reviewed following labs and imaging studies  CBC: Recent Labs  Lab 03/23/2022 1441  WBC 18.6*  NEUTROABS 14.9*  HGB 12.1  HCT 34.9*  MCV 92.6  PLT 448    Basic Metabolic Panel: Recent Labs  Lab 03/25/2022 1441  NA 135  K 3.5  CL 101  CO2 21*  GLUCOSE 119*  BUN 8  CREATININE 0.96  CALCIUM 8.0*    GFR: Estimated Creatinine Clearance: 87 mL/min (by C-G formula based on SCr of 0.96 mg/dL).  Liver Function Tests: Recent Labs  Lab 04/11/2022 1441  AST 115*  ALT 39  ALKPHOS 369*  BILITOT 26.1*  PROT 6.2*  ALBUMIN 1.6*    Urine analysis:    Component Value Date/Time   COLORURINE YELLOW 11/29/2021 1545   APPEARANCEUR HAZY (A) 11/29/2021 1545   LABSPEC 1.011 11/29/2021 1545   PHURINE 6.0 11/29/2021 1545   GLUCOSEU NEGATIVE 11/29/2021 1545   HGBUR SMALL (A) 11/29/2021 1545   BILIRUBINUR NEGATIVE 11/29/2021 1545   KETONESUR NEGATIVE 11/29/2021 1545   PROTEINUR 30 (A) 11/29/2021 1545   UROBILINOGEN 1.0 02/16/2015 1231   NITRITE NEGATIVE 11/29/2021 1545   LEUKOCYTESUR NEGATIVE 11/29/2021 1545    Radiological Exams on Admission: CT ABDOMEN PELVIS W CONTRAST  Result Date: 04/05/2022 CLINICAL DATA:  Right lower quadrant abdominal pain EXAM: CT ABDOMEN AND PELVIS WITH CONTRAST TECHNIQUE: Multidetector CT imaging of the abdomen and pelvis was performed using the standard protocol following bolus administration of intravenous contrast. RADIATION DOSE REDUCTION: This exam was performed according to the departmental dose-optimization program which includes automated exposure control, adjustment of the mA and/or kV according to patient size and/or use  of iterative reconstruction technique. CONTRAST:  76m OMNIPAQUE IOHEXOL 350 MG/ML SOLN COMPARISON:  CT abdomen dated December 01, 2021 FINDINGS: Lower chest: Right lower lobe atelectasis.  Small hiatal hernia. Hepatobiliary: Heterogeneous enhancement of the liver parenchyma, similar to prior exam and likely  due to hepatic steatosis. New low-attenuation lesion of the right lobe of the liver measuring 8 mm on series 3, image 44. Gallbladder is surgically absent. Pancreas: No pancreatic duct dilation. Mild peripancreatic fat stranding, increased when compared with the prior exam. Spleen: Unchanged splenomegaly Adrenals/Urinary Tract: Bilateral adrenal glands are unremarkable. No hydronephrosis or nephrolithiasis. Bladder is unremarkable. Stomach/Bowel: Stomach is within normal limits. No evidence of bowel wall thickening, distention, or inflammatory changes. Vascular/Lymphatic: No significant vascular findings are present. No enlarged abdominal or pelvic lymph nodes. Reproductive: Uterus and bilateral adnexa are unremarkable. Other: Small collection located posterior to the wall of the ascending colon measuring 3.5 x 2.1 cm on series 3 image 57, unchanged when compared with prior exam and likely sequela of prior retroperitoneal hematoma. Musculoskeletal: No acute or significant osseous findings. IMPRESSION: 1. Mild peripancreatic fat stranding, increased when compared with the prior exam, findings may be due to acute pancreatitis but could also be related to mesenteric edema. Correlate with lipase. 2. Sequela of portal hypertension including splenomegaly and increased large volume abdominal ascites. 3. New low-attenuation lesion of the right lobe of the liver measuring 8 mm, may be due to a simple cyst, however since finding is new when compared with prior exam further evaluation is recommended with contrast-enhanced liver MRI. This can be performed non emergently. Electronically Signed   By: LYetta GlassmanM.D.   On: 04/18/2022 17:17    EKG: Independently reviewed.  Sinus tachycardia 121 bpm.  QTc okay at 430.  Low voltage multiple leads.  Compared to previous rate faster.  Assessment/Plan Active Problems:   LFT elevation   Chronic diastolic heart failure (HCC)   Chronic lower back pain   Sinus tachycardia    Splenic vein thrombosis   Chronic pancreatitis (HCC)   Gastroesophageal reflux disease   Asthma, severe persistent, well-controlled   Depression   History of cardiac arrest   Seizure disorder (HCC)   [place]   [place]oxic brain injury ([place]   Jaundice   Jaundice LFT elevation Acute liver disease > Patient presenting with right upper quadrant pain, vomiting, diarrhea. > Workup in the ED found AST 115, ALP 369, T. bili 26.  Also notable for elevated PT and INR at 31 and 3 respectively.  Leukocytosis at 18.6. > CT abdomen pelvis showed mild pancreatic fat stranding increased in previous but could be consistent with acute on chronic pancreatitis versus mesenteric edema.  Also noted to have changes of portal hypertension and a new liver lesion that could represent a cyst with recommendation for MRI for follow-up. > GI consulted and will continue to follow concern for intra hepatic liver pathology such as acute hepatitis or acute liver injury or other.  Feel mass effect is less likely and minimal benefit from MRCP at this time. > Given large differential we will hold many home medications unless strongly indicated due to multiple medications that can cause drug-induced liver injury.  Given LFT abnormalities abdominal pain and leukocytosis consideration given to ascending cholangitis however she has not had fever and did not have changes on CT. ?Could cyst like structure represent abscess? > There may also be a component of acute on chronic pancreatitis so we will stick with clear liquid diet and  sips with meds for now and continue with pain control as needed. - Monitor on progressive unit - Appreciate GI recommendations - Trend LFTs - Trend fever curve and WBC - As needed pain control medication - Ceftriaxone and flagyl  - IV fluids - Clear liquid diet  Chronic pancreatitis > History of pancreatic pseudocyst, splenic vein thrombosis, intra-abdominal hematoma. - Continue  pancrelipase - Treating for possible component of acute on chronic as above  Seizure disorder - Continue home gabapentin and lacosamide  Tachycardia > History of sinus tachycardia, most recently in the 90s to 100s.  Currently in the 110s to 120s in the setting of pain as above. - Continue home metoprolol - As needed pain control - Continue to monitor   Hypertension - Continue home metoprolol  GERD - Holding PPI while we evaluate etiology of liver injury/disease  Anxiety Depression - Holding home BuSpar, Zoloft, trazodone for now while investigating etiology of acute liver disease -Trial of as needed hydroxyzine  QT prolongation > History of this but QTc currently normal on EKG in ED. - Will repeat EKG in the morning considering she is taking some Reglan  Neuropathy - Continue home gabapentin as above  Obesity - Noted  Diastolic CHF  > Last echo in August of this year with EF 10-17%, normal diastolic function and normal RV function.  Asthma - Continue home Pulmicort and albuterol - Holding home Singulair  Chronic pain - Continue home oxycodone - As needed Dilaudid for breakthrough pain   Cardiac arrest Anoxic brain injury Ataxia > History of cardiac arrest in July with evidence of anoxic brain injury on imaging at that time as well some ataxia, history of trach at that time as well.  Significant improvement during hospitalization and subsequent inpatient rehab stay.  Continues to follow with physical therapy. - Noted  DVT prophylaxis: SCDs (elevated PT/INR)  Code Status:   Full  Family Communication:  None on admission.  She states her husband was here earlier and is aware of her admission. Disposition Plan:   Patient is from:  Home  Anticipated DC to:  Pending clinical course  Anticipated DC date:  Pending clinical course  Anticipated DC barriers: None  Consults called:  GI  Admission status:  Inpatient, Progressive   Severity of Illness: The appropriate  patient status for this patient is INPATIENT. Inpatient status is judged to be reasonable and necessary in order to provide the required intensity of service to ensure the patient's safety. The patient's presenting symptoms, physical exam findings, and initial radiographic and laboratory data in the context of their chronic comorbidities is felt to place them at high risk for further clinical deterioration. Furthermore, it is not anticipated that the patient will be medically stable for discharge from the hospital within 2 midnights of admission.   * I certify that at the point of admission it is my clinical judgment that the patient will require inpatient hospital care spanning beyond 2 midnights from the point of admission due to high intensity of service, high risk for further deterioration and high frequency of surveillance required.Marcelyn Bruins MD [facility]  How to contact the [facility] Attending or Consulting provider [place] or covering provider during after hours [place], for this patient?   Check the care team in [facility] and look for a) attending/consulting [facility] provider listed and b) the [facility] team listed Log into [email or web address] and use [place] universal password to access. If you do not have the  password, please contact the hospital operator. Locate the [facility] provider you are looking for under [facility] and page to a number that you can be directly reached. If you still have difficulty reaching the provider, please page the [facility] (Director on Call) for the Hospitalists listed on amion for assistance.  04/12/2022, 7:32 PM

## 2022-03-27 ENCOUNTER — Inpatient Hospital Stay (HOSPITAL_COMMUNITY): Payer: BC Managed Care – PPO

## 2022-03-27 ENCOUNTER — Encounter (HOSPITAL_COMMUNITY): Payer: Self-pay | Admitting: Internal Medicine

## 2022-03-27 DIAGNOSIS — K7031 Alcoholic cirrhosis of liver with ascites: Secondary | ICD-10-CM | POA: Diagnosis not present

## 2022-03-27 DIAGNOSIS — F10988 Alcohol use, unspecified with other alcohol-induced disorder: Secondary | ICD-10-CM

## 2022-03-27 DIAGNOSIS — R791 Abnormal coagulation profile: Secondary | ICD-10-CM | POA: Diagnosis not present

## 2022-03-27 DIAGNOSIS — G931 Anoxic brain damage, not elsewhere classified: Secondary | ICD-10-CM

## 2022-03-27 DIAGNOSIS — R1011 Right upper quadrant pain: Secondary | ICD-10-CM

## 2022-03-27 DIAGNOSIS — K7469 Other cirrhosis of liver: Secondary | ICD-10-CM

## 2022-03-27 DIAGNOSIS — R161 Splenomegaly, not elsewhere classified: Secondary | ICD-10-CM

## 2022-03-27 DIAGNOSIS — K7290 Hepatic failure, unspecified without coma: Secondary | ICD-10-CM | POA: Diagnosis not present

## 2022-03-27 DIAGNOSIS — R188 Other ascites: Secondary | ICD-10-CM

## 2022-03-27 DIAGNOSIS — R000 Tachycardia, unspecified: Secondary | ICD-10-CM

## 2022-03-27 DIAGNOSIS — R17 Unspecified jaundice: Secondary | ICD-10-CM | POA: Diagnosis not present

## 2022-03-27 DIAGNOSIS — K860 Alcohol-induced chronic pancreatitis: Secondary | ICD-10-CM

## 2022-03-27 LAB — GASTROINTESTINAL PANEL BY PCR, STOOL (REPLACES STOOL CULTURE)

## 2022-03-27 LAB — COMPREHENSIVE METABOLIC PANEL
ALT: 31 U/L (ref 0–44)
AST: 84 U/L — ABNORMAL HIGH (ref 15–41)
Albumin: <1.5 g/dL — ABNORMAL LOW (ref 3.5–5.0)
Alkaline Phosphatase: 316 U/L — ABNORMAL HIGH (ref 38–126)
Anion gap: 16 — ABNORMAL HIGH (ref 5–15)
BUN: 8 mg/dL (ref 6–20)
CO2: 20 mmol/L — ABNORMAL LOW (ref 22–32)
Calcium: 7.7 mg/dL — ABNORMAL LOW (ref 8.9–10.3)
Chloride: 100 mmol/L (ref 98–111)
Creatinine, Ser: 1.3 mg/dL — ABNORMAL HIGH (ref 0.44–1.00)
GFR, Estimated: 54 mL/min — ABNORMAL LOW (ref >60)
Glucose, Bld: 108 mg/dL — ABNORMAL HIGH (ref 70–99)
Potassium: 2.7 mmol/L — CL (ref 3.5–5.1)
Sodium: 136 mmol/L (ref 135–145)
Total Bilirubin: 25.3 mg/dL (ref 0.3–1.2)
Total Protein: 5.9 g/dL — ABNORMAL LOW (ref 6.5–8.1)

## 2022-03-27 LAB — BODY FLUID CELL COUNT WITH DIFFERENTIAL
Eos, Fluid: 0 %
Lymphs, Fluid: 51 %
Monocyte-Macrophage-Serous Fluid: 43 % — ABNORMAL LOW (ref 50–90)
Neutrophil Count, Fluid: 6 % (ref 0–25)
Total Nucleated Cell Count, Fluid: 41 cu mm (ref 0–1000)

## 2022-03-27 LAB — URINALYSIS, ROUTINE W REFLEX MICROSCOPIC
Glucose, UA: NEGATIVE mg/dL
Ketones, ur: NEGATIVE mg/dL
Leukocytes,Ua: NEGATIVE
Nitrite: NEGATIVE
Protein, ur: 100 mg/dL — AB
Specific Gravity, Urine: >1.046 — ABNORMAL HIGH (ref 1.005–1.030)
pH: 5 (ref 5.0–8.0)

## 2022-03-27 LAB — PROTEIN, PLEURAL OR PERITONEAL FLUID: Total protein, fluid: <3 g/dL

## 2022-03-27 LAB — HEPATITIS PANEL, ACUTE
HCV Ab: NONREACTIVE
Hep A IgM: NONREACTIVE
Hep B C IgM: NONREACTIVE
Hepatitis B Surface Ag: NONREACTIVE

## 2022-03-27 LAB — GRAM STAIN: Gram Stain: NONE SEEN

## 2022-03-27 LAB — C DIFFICILE QUICK SCREEN W PCR REFLEX
C Diff antigen: NEGATIVE
C Diff interpretation: NOT DETECTED
C Diff toxin: NEGATIVE

## 2022-03-27 LAB — CBC
HCT: 33.9 % — ABNORMAL LOW (ref 36.0–46.0)
Hemoglobin: 11.9 g/dL — ABNORMAL LOW (ref 12.0–15.0)
MCH: 32.2 pg (ref 26.0–34.0)
MCHC: 35.1 g/dL (ref 30.0–36.0)
MCV: 91.9 fL (ref 80.0–100.0)
Platelets: 166 10*3/uL (ref 150–400)
RBC: 3.69 MIL/uL — ABNORMAL LOW (ref 3.87–5.11)
RDW: 21.5 % — ABNORMAL HIGH (ref 11.5–15.5)
WBC: 24.2 10*3/uL — ABNORMAL HIGH (ref 4.0–10.5)
nRBC: 0 % (ref 0.0–0.2)

## 2022-03-27 LAB — MAGNESIUM: Magnesium: 2.1 mg/dL (ref 1.7–2.4)

## 2022-03-27 LAB — SODIUM, URINE, RANDOM: Sodium, Ur: 26 mmol/L

## 2022-03-27 LAB — LACTATE DEHYDROGENASE, PLEURAL OR PERITONEAL FLUID: LD, Fluid: 70 U/L — ABNORMAL HIGH (ref 3–23)

## 2022-03-27 LAB — ETHANOL: Alcohol, Ethyl (B): <10 mg/dL (ref <10)

## 2022-03-27 LAB — BILIRUBIN, DIRECT: Bilirubin, Direct: 15.5 mg/dL — ABNORMAL HIGH (ref 0.0–0.2)

## 2022-03-27 LAB — MRSA NEXT GEN BY PCR, NASAL: MRSA by PCR Next Gen: NOT DETECTED

## 2022-03-27 MED ORDER — LIDOCAINE HCL (PF) 1 % IJ SOLN
INTRAMUSCULAR | Status: AC
  Filled 2022-03-27: qty 30

## 2022-03-27 MED ORDER — SODIUM CHLORIDE 0.9 % IV SOLN: INTRAVENOUS | Status: AC

## 2022-03-27 MED ORDER — ALBUMIN HUMAN 25 % IV SOLN
50.0000 g | Freq: Once | INTRAVENOUS | Status: AC
  Administered 2022-03-27: 50 g via INTRAVENOUS
  Filled 2022-03-27: qty 200

## 2022-03-27 MED ORDER — LACTULOSE 10 GM/15ML PO SOLN
20.0000 g | Freq: Three times a day (TID) | ORAL | Status: DC
  Administered 2022-03-27 – 2022-03-28 (×4): 20 g via ORAL
  Filled 2022-03-27 (×4): qty 30

## 2022-03-27 MED ORDER — POTASSIUM CHLORIDE 20 MEQ PO PACK
40.0000 meq | PACK | ORAL | Status: AC
  Administered 2022-03-27 (×2): 40 meq via ORAL
  Filled 2022-03-27 (×2): qty 2

## 2022-03-27 NOTE — Progress Notes (Signed)
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  She is also noticed that she is becoming more jaundiced in the last week.  She reports occasional subjective fever and chills at home.  Significant events: 12/8-admit to the hospital  Significant imaging / results / micro data: 12/8-CT abdomen pelvis with mild peripancreatic fat stranding, portal hypertension, splenomegaly, large volume abdominal ascites and new low-attenuation lesion of the right lobe of the liver measuring 8 mm  Subjective / 24h Interval events: Feels sleepy this morning, no nausea today.  No diarrhea this morning.  Continues to have abdominal distention and discomfort  Assesement and Plan: Active Problems:   LFT elevation   Chronic diastolic heart failure (HCC)   Chronic lower back pain   Sinus tachycardia   Splenic vein thrombosis   Chronic pancreatitis (HCC)   Gastroesophageal reflux disease   Asthma, severe persistent, well-controlled   Depression   History of cardiac arrest   Seizure disorder (HCC)   Tracheostomy status (HCC)   Anoxic brain injury ([place])   Jaundice  Principal problem Acute liver injury -looks more cholestatic in nature, AST and ALT are minimally elevated but alkaline phosphatase is 369 and total bilirubin is 26.  She is visibly jaundiced.  There is also evidence of coagulopathy with INR of 3, and she has a significant elevation of WBC. -No clear-cut  culprits, she is not using Tylenol at home, no herbal nutritional supplements or significant amounts of green tea.  She reiterates that she has been abstaining from EtOH for the past several years. -Gastroenterology consulted, appreciate input -Obtain right upper quadrant ultrasound and liver vasculature Dopplers. -There is clear evidence of liver decompensation with coagulopathy, elevated ammonia.  Hold on administering vitamin K until liver Dopplers are done.  Start lactulose  Active problems SIRS-she is tachycardic, tachypneic, has a significantly elevated WBC.  She was placed on empiric antibiotics with ceftriaxone and metronidazole, continue.  Acute kidney injury-she has been intermittently hypotensive.  Creatinine this morning is 1.3.  She has undetectably low albumin, give 50 g now, continue IV fluids.  Check urine sodium.  CT scan of the abdomen pelvis did not mention any significant renal pathology  Mild hepatic encephalopathy-patient's, sleeping on my evaluation, she is alert and oriented x 4 but clearly has a degree of mild encephalopathy.  Start lactulose  Acute on chronic pancreatitis -CT scan on admission showed mild peripancreatic stranding, lipase is elevated.  Continue IV fluids  Seizure disorder-continue home medications  Essential hypertension-she is on low-dose metoprolol  GERD - Holding PPI while we evaluate etiology of liver injury/disease   Anxiety, depression - Holding home BuSpar, Zoloft, trazodone for now while investigating etiology of acute liver injury   QT prolongation - History of this but QTc currently normal on EKG in ED.   Neuropathy- Continue home gabapentin as above   Obesity, class I-BMI 33   Diastolic CHF - Last echo in August of this year with EF 27-25%, normal diastolic function and normal RV function.  Asthma - Continue home Pulmicort and albuterol  Chronic pain - Continue home oxycodone   History of cardiac arrest August 2023, mild anoxic  brain injury, ataxia - History of cardiac arrest in July with evidence of anoxic brain injury on imaging at that time as well some ataxia, history of trach at that time as well, now decannulated.     Scheduled Meds:  budesonide  0.25 mg Nebulization BID   gabapentin  900 mg Oral BID   lacosamide  100 mg Oral BID   lactulose  20 g Oral TID   lipase/protease/amylase  24,000 Units Oral TID WC   magnesium oxide  400 mg Oral Daily   metoprolol tartrate  12.5 mg Oral BID   potassium chloride  40 mEq Oral Q3H   sodium chloride flush  3 mL Intravenous Q12H   thiamine  100 mg Oral Daily   Continuous Infusions:  sodium chloride     albumin human     cefTRIAXone (ROCEPHIN)  IV Stopped (04/02/2022 2008)   metronidazole Stopped (03/19/2022 2122)   PRN Meds:.albuterol, HYDROmorphone (DILAUDID) injection, hydrOXYzine, lidocaine, metoCLOPramide (REGLAN) injection, oxyCODONE, polyethylene glycol, polyvinyl alcohol  Current Outpatient Medications  Medication Instructions   albuterol (VENTOLIN HFA) 108 (90 Base) MCG/ACT inhaler 2 puffs, Inhalation, Every 6 hours PRN   atorvastatin (LIPITOR) 10 MG tablet TAKE 1 TABLET(10 MG) BY MOUTH AT BEDTIME   blood glucose meter kit and supplies KIT Dispense based on patient and insurance preference. Use up to four times daily as directed.   budesonide (PULMICORT) 0.25 mg, Nebulization, 2 times daily   busPIRone (BUSPAR) 5-10 mg, Oral, 3 times daily PRN, For anxiety- take 1-2 tabs as needed-   cetirizine (ZYRTEC) 10 MG tablet TAKE 1 TABLET(10 MG) BY MOUTH DAILY   diphenhydrAMINE (BENADRYL) 25 mg, Oral, Every 6 hours PRN   EPINEPHrine (EPIPEN 2-PAK) 0.3 mg, Intramuscular, Once PRN   folic acid (FOLVITE) 1 MG tablet TAKE 1 TABLET(1 MG) BY MOUTH DAILY   gabapentin (NEURONTIN) 300 MG capsule TAKE 3 CAPSULES(900 MG) BY MOUTH TWICE DAILY   Lacosamide 100 mg, Oral, 2 times daily   lidocaine (LIDODERM) 5 % 1 patch, Transdermal, Every 24 hours, Remove & Discard patch within  12 hours or as directed by MD   magnesium oxide (MAG-OX) 400 (240 Mg) MG tablet TAKE 2 TABLETS(800 MG) BY MOUTH TWICE DAILY   melatonin 3 mg, Oral, Daily at bedtime   metFORMIN (GLUCOPHAGE) 500 mg, Oral, 2 times daily with meals   metoprolol tartrate (LOPRESSOR) 25 MG tablet TAKE 1 TABLET(25 MG) BY MOUTH TWICE DAILY   montelukast (SINGULAIR) 10 MG tablet TAKE ONE TABLET BY MOUTH AT BEDTIME   naloxone (NARCAN) nasal spray 4 mg/0.1 mL SMARTSIG:Both Nares   omeprazole (PRILOSEC) 40 MG capsule TAKE 1 CAPSULE(40 MG) BY MOUTH DAILY   oxyCODONE (ROXICODONE) 7.5 mg, Oral, Every 8 hours PRN, This is an increase from  5 mg to 7.5 mg three times a day as needed. Ninety pills were filled on 10/21/21 prior to your admission on 07/19. Now that you are taking less you should have almost a month supply.   Pancrelipase, Lip-Prot-Amyl, (ZENPEP) 20000-63000 units CPEP 2 capsules, Oral, 3 times daily   polycarbophil (FIBERCON) 625 mg, Oral, 2 times daily   polyvinyl alcohol (LIQUIFILM TEARS) 1.4 % ophthalmic solution 1 drop, Both Eyes, As needed   Probiotic Product (DIGESTIVE ADVANTAGE) CAPS 1 capsule, Oral, Daily   saccharomyces boulardii (FLORASTOR) 250 mg, Oral, 2 times daily   sertraline (  ZOLOFT) 100 mg, Oral, Daily   thiamine (VITAMIN B-1) 100 mg, Oral, Daily   traZODone (DESYREL) 75 mg, Oral, Daily at bedtime    Diet Orders (From admission, onward)     Start     Ordered   03/27/2022 1932  Diet clear liquid Room service appropriate? Yes; Fluid consistency: Thin  Diet effective now       Question Answer Comment  Room service appropriate? Yes   Fluid consistency: Thin      03/25/2022 1931            DVT prophylaxis: SCDs Start: 03/21/2022 1855   Lab Results  Component Value Date   PLT 166 03/27/2022      Code Status: Full Code  Family Communication: no family at bedside   Status is: Inpatient  Remains inpatient appropriate because: Severity of illness  Level of care:  Progressive  Consultants:  Gastroenterology  Objective: Vitals:   04/12/2022 2345 03/27/22 0347 03/27/22 0500 03/27/22 0737  BP: 98/67 (!) 107/59 (!) 90/57 (!) 99/56  Pulse: (!) 110 (!) 117 (!) 116 (!) 117  Resp: 20 (!) 29 20 (!) 23  Temp: 99.1 F (37.3 C) 99.1 F (37.3 C)  99.3 F (37.4 C)  TempSrc: Oral Oral  Oral  SpO2: 95% 92% 92% 91%  Weight: 91.8 kg 91.8 kg    Height: _0  (1.651 m)       Intake/Output Summary (Last 24 hours) at 03/27/2022 1610 Last data filed at 03/27/2022 9604 Gross per 24 hour  Intake 700 ml  Output 50 ml  Net 650 ml   Wt Readings from Last 3 Encounters:  03/27/22 91.8 kg  01/26/22 88.5 kg  01/18/22 87.8 kg    Examination:  Constitutional: NAD Eyes: Scleral icterus present ENMT: Mucous membranes are moist.  Neck: normal, supple Respiratory: Diminished breath sounds bilaterally, no wheezing heard Cardiovascular: Regular rate and rhythm, no murmurs / rubs / gallops.  Abdomen: Distended, mild tenderness to palpation throughout, no guarding or rebound Musculoskeletal: no clubbing / cyanosis.  Skin: no rashes Neurologic: non focal   Data Reviewed: I have independently reviewed following labs and imaging studies   CBC Recent Labs  Lab 03/28/2022 1441 03/27/22 0643  WBC 18.6* 24.2*  HGB 12.1 11.9*  HCT 34.9* 33.9*  PLT 171 166  MCV 92.6 91.9  MCH 32.1 32.2  MCHC 34.7 35.1  RDW 21.3* 21.5*  LYMPHSABS 1.4  --   MONOABS 1.9*  --   EOSABS 0.0  --   BASOSABS 0.0  --     Recent Labs  Lab 03/22/2022 1441 04/17/2022 1552 03/27/22 0643  NA 135  --  136  K 3.5  --  2.7*  CL 101  --  100  CO2 21*  --  20*  GLUCOSE 119*  --  108*  BUN 8  --  8  CREATININE 0.96  --  1.30*  CALCIUM 8.0*  --  7.7*  AST 115*  --  84*  ALT 39  --  31  ALKPHOS 369*  --  316*  BILITOT 26.1*  --  25.3*  ALBUMIN 1.6*  --  <1.5*  LATICACIDVEN 1.3  --   --   INR  --  3.0*  --   AMMONIA  --  67*  --      ------------------------------------------------------------------------------------------------------------------ No results for input(s): "CHOL", "HDL", "LDLCALC", "TRIG", "CHOLHDL", "LDLDIRECT" in the last 72 hours.  Lab Results  Component Value Date   HGBA1C 4.5 (L) 11/27/2021   ------------------------------------------------------------------------------------------------------------------  No results for input(s): "TSH", "T4TOTAL", "T3FREE", "THYROIDAB" in the last 72 hours.  Invalid input(s): "FREET3"  Cardiac Enzymes No results for input(s): "CKMB", "TROPONINI", "MYOGLOBIN" in the last 168 hours.  Invalid input(s): "CK" ------------------------------------------------------------------------------------------------------------------    Component Value Date/Time   BNP 252.7 (H) 11/13/2021 1930    CBG: No results for input(s): "GLUCAP" in the last 168 hours.  Recent Results (from the past 240 hour(s))  MRSA Next Gen by PCR, Nasal     Status: None   Collection Time: 04/16/2022 11:54 PM   Specimen: Nasal Mucosa; Nasal Swab  Result Value Ref Range Status   MRSA by PCR Next Gen NOT DETECTED NOT DETECTED Final    Comment: (NOTE) The GeneXpert MRSA Assay (FDA approved for NASAL specimens only), is one component of a comprehensive MRSA colonization surveillance program. It is not intended to diagnose MRSA infection nor to guide or monitor treatment for MRSA infections. Test performance is not FDA approved in patients less than 64 years old. Performed at [facility], [place] [address] [place], [place] [address]      Radiology Studies: CT ABDOMEN PELVIS W CONTRAST  Result Date: 03/25/2022 CLINICAL DATA:  Right lower quadrant abdominal pain EXAM: CT ABDOMEN AND PELVIS WITH CONTRAST TECHNIQUE: Multidetector CT imaging of the abdomen and pelvis was performed using the standard protocol following bolus administration of intravenous contrast. RADIATION DOSE REDUCTION:  This exam was performed according to the departmental dose-optimization program which includes automated exposure control, adjustment of the mA and/or kV according to patient size and/or use of iterative reconstruction technique. CONTRAST:  81m OMNIPAQUE IOHEXOL 350 MG/ML SOLN COMPARISON:  CT abdomen dated December 01, 2021 FINDINGS: Lower chest: Right lower lobe atelectasis.  Small hiatal hernia. Hepatobiliary: Heterogeneous enhancement of the liver parenchyma, similar to prior exam and likely due to hepatic steatosis. New low-attenuation lesion of the right lobe of the liver measuring 8 mm on series 3, image 44. Gallbladder is surgically absent. Pancreas: No pancreatic duct dilation. Mild peripancreatic fat stranding, increased when compared with the prior exam. Spleen: Unchanged splenomegaly Adrenals/Urinary Tract: Bilateral adrenal glands are unremarkable. No hydronephrosis or nephrolithiasis. Bladder is unremarkable. Stomach/Bowel: Stomach is within normal limits. No evidence of bowel wall thickening, distention, or inflammatory changes. Vascular/Lymphatic: No significant vascular findings are present. No enlarged abdominal or pelvic lymph nodes. Reproductive: Uterus and bilateral adnexa are unremarkable. Other: Small collection located posterior to the wall of the ascending colon measuring 3.5 x 2.1 cm on series 3 image 57, unchanged when compared with prior exam and likely sequela of prior retroperitoneal hematoma. Musculoskeletal: No acute or significant osseous findings. IMPRESSION: 1. Mild peripancreatic fat stranding, increased when compared with the prior exam, findings may be due to acute pancreatitis but could also be related to mesenteric edema. Correlate with lipase. 2. Sequela of portal hypertension including splenomegaly and increased large volume abdominal ascites. 3. New low-attenuation lesion of the right lobe of the liver measuring 8 mm, may be due to a simple cyst, however since finding is new  when compared with prior exam further evaluation is recommended with contrast-enhanced liver MRI. This can be performed non emergently. Electronically Signed   By: LYetta GlassmanM.D.   On: 03/24/2022 17:17     CMarzetta Board MD, PhD [facility]  Between 7 am - 7 pm I am available, please contact me via Amion (for emergencies) or Securechat (non urgent messages)  Between 7 pm - 7 am I am not available, please contact night  coverage MD/APP via Amion

## 2022-03-27 NOTE — Progress Notes (Signed)
   03/27/22 0347  Assess: MEWS Score  Temp 99.1 F (37.3 C)  BP (!) 107/59  MAP (mmHg) 71  Pulse Rate (!) 117  ECG Heart Rate (!) 117  Resp (!) 29  Level of Consciousness Alert  SpO2 92 %  O2 Device Nasal Cannula  O2 Flow Rate (L/min) 2 L/min  Assess: MEWS Score  MEWS Temp 0  MEWS Systolic 0  MEWS Pulse 2  MEWS RR 2  MEWS LOC 0  MEWS Score 4  MEWS Score Color Red  Assess: if the MEWS score is Yellow or Red  Were vital signs taken at a resting state? Yes  Focused Assessment No change from prior assessment  Does the patient meet 2 or more of the SIRS criteria? No  MEWS guidelines implemented *See Row Information* Yes  Treat  Pain Scale 0-10  Pain Score 0  Take Vital Signs  Increase Vital Sign Frequency  Red: Q 1hr X 4 then Q 4hr X 4, if remains red, continue Q 4hrs  Escalate  MEWS: Escalate Red: discuss with charge nurse/RN and provider, consider discussing with RRT  Notify: Charge Nurse/RN  Name of Charge Nurse/RN Notified Angie  Date Charge Nurse/RN Notified 03/27/22  Time Charge Nurse/RN Notified 0541  Document  Progress note created (see row info) Yes  Assess: SIRS CRITERIA  SIRS Temperature  0  SIRS Pulse 1  SIRS Respirations  1  SIRS WBC 1  SIRS Score Sum  3

## 2022-03-27 NOTE — Plan of Care (Signed)
  Problem: Education: Goal: Knowledge of General Education information will improve Description: Including pain rating scale, medication(s)/side effects and non-pharmacologic comfort measures Outcome: Progressing   Problem: Clinical Measurements: Goal: Ability to maintain clinical measurements within normal limits will improve Outcome: Progressing Goal: Will remain free from infection Outcome: Progressing Goal: Respiratory complications will improve Outcome: Progressing Goal: Cardiovascular complication will be avoided Outcome: Progressing   Problem: Coping: Goal: Level of anxiety will decrease Outcome: Progressing   

## 2022-03-27 NOTE — Consult Note (Addendum)
Referring Provider: EDP Primary Care Physician:  Lawerance Cruel, MD Primary Gastroenterologist:  [facility], but has not been seen since 2022  Reason for Consultation:  Cirrhosis  HPI: Kara [name] is a 36 y.o. female with medical history significant of chronic pancreatitis, pancreatic pseudocyst, splenic vein thrombosis, intra-abdominal hematoma, warfarin induced coagulopathy, seizures, tachycardia, tracheostomy, hypertension, GERD, depression, QT prolongation, neuropathy, obesity, diastolic CHF, asthma, chronic pain, cardiac arrest and anoxic brain injury in August 2023, ataxia presenting with right upper quadrant pain, emesis, diarrhea as well as progressive jaundice over the past 2 weeks or so.  Her INR is 3.  White blood cell count 24.2 K. Creatinine up to 1.3.  Sodium normal at 136, potassium 2.7.  Albumin less than 1.5, AST 84, ALT 31, alk phos 316, total bili 25.3.  Total bili was normal few months ago.  She continues to adamantly deny any alcohol use for the past 6 years.  CT scan of the abdomen and pelvis with contrast showed the following:  IMPRESSION: 1. Mild peripancreatic fat stranding, increased when compared with the prior exam, findings may be due to acute pancreatitis but could also be related to mesenteric edema. Correlate with lipase. 2. Sequela of portal hypertension including splenomegaly and increased large volume abdominal ascites. 3. New low-attenuation lesion of the right lobe of the liver measuring 8 mm, may be due to a simple cyst, however since finding is new when compared with prior exam further evaluation is recommended with contrast-enhanced liver MRI. This can be performed non emergently.  Ultrasound with dopplers ordered and paracentesis with fluid studies.   Past Medical History:  Diagnosis Date   Alcoholism in remission ([place])    per pt in remission since 2017   Anemia    AR (allergic rhinitis)    allergist-- dr Neldon Mc   Biliary dyskinesia     CAP (community acquired pneumonia) 06/13/2016   Cardiac arrest ([place]) 33/29/5188   Diastolic CHF ([place])    followd by dr Edmonia James   Environmental allergies    FUO (fever of unknown origin)    Generalized tonic-clonic seizure ([place]) 01/20/2021   GERD (gastroesophageal reflux disease)    HCAP (healthcare-associated pneumonia) 07/31/2016   Headache    History of acute pancreatitis    alcoholic pancreatitis ,  4166 and 2017   History of sepsis 06/12/2016   with CAP   History of syncope    History of thrombosis 01/29/2015   splenic vein thrombosis-- treated with coumadin--- readmitted for abd. hematoma due to coumadin treated with IR embolization of the IMA branches   Hypertension    Hypokalemia 04/08/2014   Hypomagnesemia 12/15/2021   Hyponatremia 08/27/2016   Hypothermia 07/16/2014   Polyneuropathy    Prolonged QT syndrome    cardiologist-- dr Johnsie Cancel (notes in epic)   Respiratory failure with hypoxia ([place]) 07/31/2016   Sepsis ([place]) 06/12/2016   Severe persistent asthma    pulmonologist-- dr dr Bernita Raisin (notes in epic)    Past Surgical History:  Procedure Laterality Date   CHOLECYSTECTOMY N/A 06/21/2018   Procedure: LAPAROSCOPIC CHOLECYSTECTOMY;  Surgeon: Coralie Keens, MD;  Location: [facility];  Service: General;  Laterality: N/A;   ESOPHAGOGASTRODUODENOSCOPY N/A 07/17/2014   Procedure: ESOPHAGOGASTRODUODENOSCOPY (EGD);  Surgeon: Teena Irani, MD;  Location: [facility];  Service: Endoscopy;  Laterality: N/A;   TYMPANOSTOMY TUBE PLACEMENT Bilateral 11 MONTHS OLD   VIDEO BRONCHOSCOPY Bilateral 10/08/2016   Procedure: VIDEO BRONCHOSCOPY WITH FLUORO;  Surgeon: Marshell Garfinkel, MD;  Location: [facility];  Service: Cardiopulmonary;  Laterality: Bilateral;    Prior to Admission medications   Medication Sig Start Date End Date Taking? Authorizing Provider  albuterol (VENTOLIN HFA) 108 (90 Base) MCG/ACT inhaler Inhale 2 puffs into the lungs every 6 (six) hours as needed for shortness  of breath. 03/02/22  Yes [provider]  atorvastatin (LIPITOR) 10 MG tablet TAKE 1 TABLET(10 MG) BY MOUTH AT BEDTIME Patient taking differently: Take 10 mg by mouth every evening. 01/26/22  Yes Conte, Tessa N, PA-C  budesonide (PULMICORT) 0.25 MG/2ML nebulizer solution Take 2 mLs (0.25 mg total) by nebulization 2 (two) times daily. 11/26/21  Yes Charlynne Cousins, MD  busPIRone (BUSPAR) 5 MG tablet Take 1-2 tablets (5-10 mg total) by mouth 3 (three) times daily as needed (anxiety). For anxiety- take 1-2 tabs as needed- 01/18/22  Yes Lovorn, Jinny Blossom, MD  cetirizine (ZYRTEC) 10 MG tablet TAKE 1 TABLET(10 MG) BY MOUTH DAILY Patient taking differently: Take 10 mg by mouth daily. 01/07/22  Yes Lovorn, Jinny Blossom, MD  diphenhydrAMINE (BENADRYL) 25 MG tablet Take 25 mg by mouth every 6 (six) hours as needed for itching.   Yes [provider]  folic acid (FOLVITE) 1 MG tablet TAKE 1 TABLET(1 MG) BY MOUTH DAILY Patient taking differently: Take 1 mg by mouth daily. 01/11/22  Yes Lovorn, Jinny Blossom, MD  gabapentin (NEURONTIN) 300 MG capsule TAKE 3 CAPSULES(900 MG) BY MOUTH TWICE DAILY Patient taking differently: Take 900 mg by mouth 2 (two) times daily. 01/11/22  Yes Lovorn, Jinny Blossom, MD  Lacosamide 100 MG TABS Take 1 tablet (100 mg total) by mouth 2 (two) times daily. 12/16/21  Yes Sater, Nanine Means, MD  lidocaine (LIDODERM) 5 % Place 1 patch onto the skin daily. Remove & Discard patch within 12 hours or as directed by MD Patient taking differently: Place 1 patch onto the skin daily as needed (for pain). 12/15/21  Yes Love, Ivan Anchors, PA-C  magnesium oxide (MAG-OX) 400 (240 Mg) MG tablet TAKE 2 TABLETS(800 MG) BY MOUTH TWICE DAILY Patient taking differently: Take 400 mg by mouth daily. 03/16/22  Yes Lovorn, Megan, MD  melatonin 3 MG TABS tablet Take 3 mg by mouth at bedtime.   Yes [provider]  metFORMIN (GLUCOPHAGE) 500 MG tablet Take 1 tablet (500 mg total) by mouth 2 (two) times daily with a  meal. 12/15/21  Yes Love, Ivan Anchors, PA-C  metoprolol tartrate (LOPRESSOR) 25 MG tablet TAKE 1 TABLET(25 MG) BY MOUTH TWICE DAILY Patient taking differently: Take 12.5 mg by mouth 2 (two) times daily. 01/26/22  Yes Conte, Tessa N, PA-C  montelukast (SINGULAIR) 10 MG tablet TAKE ONE TABLET BY MOUTH AT BEDTIME Patient taking differently: Take 10 mg by mouth at bedtime. 12/13/19  Yes Ambs, Kathrine Cords, FNP  naloxone [facility]) nasal spray 4 mg/0.1 mL SMARTSIG:Both Nares 12/17/21  Yes [provider]  omeprazole (PRILOSEC) 40 MG capsule TAKE 1 CAPSULE(40 MG) BY MOUTH DAILY Patient taking differently: Take 40 mg by mouth daily. 01/08/20  Yes Ambs, Kathrine Cords, FNP  oxyCODONE (ROXICODONE) 15 MG immediate release tablet Take 0.5 tablets (7.5 mg total) by mouth every 8 (eight) hours as needed for pain. This is an increase from  5 mg to 7.5 mg three times a day as needed. Ninety pills were filled on 10/21/21 prior to your admission on 07/19. Now that you are taking less you should have almost a month supply. Patient taking differently: Take 10 mg by mouth every 8 (eight) hours as needed for pain.  This is an increase from  5 mg to 7.5 mg three times a day as needed. Ninety pills were filled on 10/21/21 prior to your admission on 07/19. Now that you are taking less you should have almost a month supply. 12/15/21  Yes Love, Ivan Anchors, PA-C  Pancrelipase, Lip-Prot-Amyl, (ZENPEP) 20000-63000 units CPEP Take 2 capsules by mouth 3 (three) times daily. 12/15/21  Yes Love, Ivan Anchors, PA-C  polycarbophil (FIBERCON) 625 MG tablet Take 1 tablet (625 mg total) by mouth 2 (two) times daily. 12/15/21  Yes Love, Ivan Anchors, PA-C  polyvinyl alcohol (LIQUIFILM TEARS) 1.4 % ophthalmic solution Place 1 drop into both eyes as needed for dry eyes. Patient taking differently: Place 1 drop into both eyes daily as needed for dry eyes. 11/26/21  Yes Charlynne Cousins, MD  Probiotic Product (DIGESTIVE ADVANTAGE) CAPS Take 1 capsule by mouth daily.    Yes [provider]  saccharomyces boulardii (FLORASTOR) 250 MG capsule Take 1 capsule (250 mg total) by mouth 2 (two) times daily. 12/15/21  Yes Love, Ivan Anchors, PA-C  sertraline (ZOLOFT) 100 MG tablet Take 1 tablet (100 mg total) by mouth daily. 12/15/21  Yes Love, Ivan Anchors, PA-C  thiamine (VITAMIN B-1) 100 MG tablet Take 1 tablet (100 mg total) by mouth daily. 12/15/21  Yes Love, Ivan Anchors, PA-C  traZODone (DESYREL) 150 MG tablet Take 0.5 tablets (75 mg total) by mouth at bedtime. 12/15/21  Yes Love, Ivan Anchors, PA-C  blood glucose meter kit and supplies KIT Dispense based on patient and insurance preference. Use up to four times daily as directed. 12/15/21   Love, Ivan Anchors, PA-C  EPINEPHrine (EPIPEN 2-PAK) 0.3 mg/0.3 mL IJ SOAJ injection Inject 0.3 mLs (0.3 mg total) into the muscle once as needed (for severe allergic reaction). 07/11/18   Kozlow, Donnamarie Poag, MD    Current Facility-Administered Medications  Medication Dose Route Frequency Provider Last Rate Last Admin   0.9 %  sodium chloride infusion   Intravenous Continuous Gherghe, Vella Redhead, MD       albumin human 25 % solution 50 g  50 g Intravenous Once Caren Griffins, MD       albuterol (PROVENTIL) (2.5 MG/3ML) 0.083% nebulizer solution 2.5 mg  2.5 mg Nebulization Q6H PRN Marcelyn Bruins, MD       budesonide (PULMICORT) nebulizer solution 0.25 mg  0.25 mg Nebulization BID Marcelyn Bruins, MD   0.25 mg at 03/25/2022 2152   cefTRIAXone (ROCEPHIN) 2 g in sodium chloride 0.9 % 100 mL IVPB  2 g Intravenous Daily Marcelyn Bruins, MD   Stopped at 03/25/2022 2008   gabapentin (NEURONTIN) capsule 900 mg  900 mg Oral BID Marcelyn Bruins, MD   900 mg at 03/31/2022 2153   HYDROmorphone (DILAUDID) injection 1 mg  1 mg Intravenous Q4H PRN Marcelyn Bruins, MD   1 mg at 03/27/22 8185   hydrOXYzine (ATARAX) tablet 10 mg  10 mg Oral Q6H PRN Marcelyn Bruins, MD   10 mg at 04/13/2022 2011   lacosamide (VIMPAT) tablet 100 mg  100 mg Oral BID  Marcelyn Bruins, MD   100 mg at 03/22/2022 2152   lactulose (CHRONULAC) 10 GM/15ML solution 20 g  20 g Oral TID Caren Griffins, MD       lidocaine (LIDODERM) 5 % 1 patch  1 patch Transdermal Daily PRN Marcelyn Bruins, MD       lidocaine (PF) (XYLOCAINE) 1 % injection  lipase/protease/amylase (CREON) capsule 24,000 Units  24,000 Units Oral TID WC Bell, Lorin C, RPH       magnesium oxide (MAG-OX) tablet 400 mg  400 mg Oral Daily Marcelyn Bruins, MD   400 mg at 04/14/2022 2001   metoCLOPramide (REGLAN) injection 5 mg  5 mg Intravenous Q6H PRN Marcelyn Bruins, MD       metoprolol tartrate (LOPRESSOR) tablet 12.5 mg  12.5 mg Oral BID Marcelyn Bruins, MD   12.5 mg at 04/11/2022 2153   metroNIDAZOLE (FLAGYL) IVPB 500 mg  500 mg Intravenous Q12H Marcelyn Bruins, MD   Stopped at 04/04/2022 2122   oxyCODONE (Oxy IR/ROXICODONE) immediate release tablet 7.5 mg  7.5 mg Oral Q8H PRN Marcelyn Bruins, MD   7.5 mg at 03/27/22 0145   polyethylene glycol (MIRALAX / GLYCOLAX) packet 17 g  17 g Oral Daily PRN Marcelyn Bruins, MD       polyvinyl alcohol (LIQUIFILM TEARS) 1.4 % ophthalmic solution 1 drop  1 drop Both Eyes Daily PRN Marcelyn Bruins, MD       potassium chloride (KLOR-CON) packet 40 mEq  40 mEq Oral Q3H Caren Griffins, MD       sodium chloride flush (NS) 0.9 % injection 3 mL  3 mL Intravenous Q12H Marcelyn Bruins, MD   3 mL at 04/14/2022 2237   thiamine (VITAMIN B1) tablet 100 mg  100 mg Oral Daily Marcelyn Bruins, MD   100 mg at 04/14/2022 2001    Allergies as of 03/20/2022 - Review Complete 03/29/2022  Allergen Reaction Noted   Maxzide [hydrochlorothiazide w-triamterene] Nausea Only and Other (See Comments)    Nsaids Diarrhea and Nausea And Vomiting 11/09/2021   Other Hives, Itching, and Other (See Comments) 11/04/2021   Peanut butter flavor Hives and Itching 11/09/2021   Peanuts [peanut oil] Hives and Itching 07/06/2014   Tylenol [acetaminophen]  Diarrhea, Nausea And Vomiting, and Other (See Comments) 11/09/2021   Wellbutrin [bupropion] Other (See Comments) 12/09/2021    Family History  Problem Relation Age of Onset   Heart failure Mother    Colon polyps Mother    Heart failure Father    Diabetes Father    Heart failure Maternal Grandfather    Colon cancer Neg Hx     Social History   Socioeconomic History   Marital status: Married    Spouse name: Winferd Humphrey   Number of children: 0   Years of education: Not on file   Highest education level: Not on file  Occupational History   Occupation: Investment banker, corporate  Tobacco Use   Smoking status: Never   Smokeless tobacco: Never  Vaping Use   Vaping Use: Unknown  Substance and Sexual Activity   Alcohol use: Not Currently    Comment: pt hx alcohol abuse , in remission since 2017   Drug use: Not Currently    Comment: "POT WHEN I WAS IN HIGH SCHOOL"   Sexual activity: Yes    Birth control/protection: None  Other Topics Concern   Not on file  Social History Narrative   ** Merged History Encounter **       Married.   Social Determinants of Health   Financial Resource Strain: Not on file  Food Insecurity: Not on file  Transportation Needs: Not on file  Physical Activity: Not on file  Stress: Not on file  Social Connections: Not on file  Intimate Partner Violence: Not on file    Review of Systems:  ROS is O/W negative except as mentioned in HPI.  Physical Exam: Vital signs in last 24 hours: Temp:  [98.9 F (37.2 C)-99.3 F (37.4 C)] 99.3 F (37.4 C) (12/09 0737) Pulse Rate:  [110-136] 117 (12/09 0737) Resp:  [18-44] 23 (12/09 0737) BP: (90-121)/(52-90) 102/61 (12/09 1053) SpO2:  [91 %-100 %] 91 % (12/09 0737) Weight:  [86.2 kg-91.8 kg] 91.8 kg (12/09 0347) Last BM Date : 03/27/22 General:  Chronically ill-appearing, in NAD.  Deep jaundice noted. Head:  Normocephalic and atraumatic. Eyes:  Scleral icterus. Ears:  Normal auditory acuity. Mouth:  No deformity  or lesions.   Lungs:  Clear throughout to auscultation.  No wheezes, crackles, or rhonchi.  Heart:  Tachy but regular rhythm. Abdomen:  Soft, distended with ascites fluid.  BS present.  Mild diffuse TTP. Msk:  Symmetrical without gross deformities. Pulses:  Normal pulses noted. Extremities:  Without clubbing or edema. Neurologic:  Sleepy but oriented x 4;  grossly normal neurologically. Skin:  Intact without significant lesions or rashes.  Intake/Output from previous day: 12/08 0701 - 12/09 0700 In: 700 [IV Piggyback:700] Out: 12 [Urine:50]  Lab Results: Recent Labs    03/28/2022 1441 03/27/22 0643  WBC 18.6* 24.2*  HGB 12.1 11.9*  HCT 34.9* 33.9*  PLT 171 166   BMET Recent Labs    04/14/2022 1441 03/27/22 0643  NA 135 136  K 3.5 2.7*  CL 101 100  CO2 21* 20*  GLUCOSE 119* 108*  BUN 8 8  CREATININE 0.96 1.30*  CALCIUM 8.0* 7.7*   LFT Recent Labs    03/27/22 0643  PROT 5.9*  ALBUMIN <1.5*  AST 84*  ALT 31  ALKPHOS 316*  BILITOT 25.3*  BILIDIR 15.5*   PT/INR Recent Labs    04/13/2022 1552  LABPROT 31.0*  INR 3.0*   Studies/Results: CT ABDOMEN PELVIS W CONTRAST  Result Date: 03/25/2022 CLINICAL DATA:  Right lower quadrant abdominal pain EXAM: CT ABDOMEN AND PELVIS WITH CONTRAST TECHNIQUE: Multidetector CT imaging of the abdomen and pelvis was performed using the standard protocol following bolus administration of intravenous contrast. RADIATION DOSE REDUCTION: This exam was performed according to the departmental dose-optimization program which includes automated exposure control, adjustment of the mA and/or kV according to patient size and/or use of iterative reconstruction technique. CONTRAST:  88m OMNIPAQUE IOHEXOL 350 MG/ML SOLN COMPARISON:  CT abdomen dated December 01, 2021 FINDINGS: Lower chest: Right lower lobe atelectasis.  Small hiatal hernia. Hepatobiliary: Heterogeneous enhancement of the liver parenchyma, similar to prior exam and likely due to hepatic  steatosis. New low-attenuation lesion of the right lobe of the liver measuring 8 mm on series 3, image 44. Gallbladder is surgically absent. Pancreas: No pancreatic duct dilation. Mild peripancreatic fat stranding, increased when compared with the prior exam. Spleen: Unchanged splenomegaly Adrenals/Urinary Tract: Bilateral adrenal glands are unremarkable. No hydronephrosis or nephrolithiasis. Bladder is unremarkable. Stomach/Bowel: Stomach is within normal limits. No evidence of bowel wall thickening, distention, or inflammatory changes. Vascular/Lymphatic: No significant vascular findings are present. No enlarged abdominal or pelvic lymph nodes. Reproductive: Uterus and bilateral adnexa are unremarkable. Other: Small collection located posterior to the wall of the ascending colon measuring 3.5 x 2.1 cm on series 3 image 57, unchanged when compared with prior exam and likely sequela of prior retroperitoneal hematoma. Musculoskeletal: No acute or significant osseous findings. IMPRESSION: 1. Mild peripancreatic fat stranding, increased when compared with the prior exam, findings may be due to acute pancreatitis but could also be related to mesenteric  edema. Correlate with lipase. 2. Sequela of portal hypertension including splenomegaly and increased large volume abdominal ascites. 3. New low-attenuation lesion of the right lobe of the liver measuring 8 mm, may be due to a simple cyst, however since finding is new when compared with prior exam further evaluation is recommended with contrast-enhanced liver MRI. This can be performed non emergently. Electronically Signed   By: Yetta Glassman M.D.   On: 04/18/2022 17:17    IMPRESSION:  *Liver failure in the setting of chronic liver disease/cirrhosis: She has splenomegaly and ascites.  Paracentesis has been ordered along with fluid studies.  ? Cause of this rapid decline.  ? Infection.  Hepatitis panel is pending.  Labs for other causes of chronic liver disease had  been performed in the past within the past couple years and were negative. *Hypokalemia with potassium 2.7 *Leukocytosis up to 24.2 K *Coagulopathy with INR 3.0 *AKI:  Checking a urine sodium. *Chronic pancreatitis on pancreatic enzymes at home. 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  PLAN: -Agree with paracentesis with fluid studies for cell count to rule out SBP. -Agree with empiric coverage with ceftriaxone and Flagyl for now. -Agree with abdominal ultrasound with Dopplers. -Will check a PETH ETOH level and an acute ETOH level.  MELD 3.0: 38 at 03/27/2022  6:43 AM MELD-Na: 33 at 03/27/2022  6:43 AM Calculated from: Serum Creatinine: 1.30 mg/dL at 03/27/2022  6:43 AM Serum Sodium: 136 mmol/L at 03/27/2022  6:43 AM Total Bilirubin: 25.3 mg/dL at 03/27/2022  6:43 AM Serum Albumin: 1.6 g/dL at 04/12/2022  2:41 PM INR(ratio): 3.0 at 04/05/2022  3:52 PM Age at listing (hypothetical): 36 years Sex: Female at 03/27/2022  6:43 AM    Kara [name]  03/27/2022, 11:13 AM    Attending physician's note  I have taken a history, reviewed the chart and examined the patient. I performed a substantive portion of this encounter, including complete performance of at least one of the key components, in conjunction with the APP. I agree with the APP's note, impression and recommendations.    36 year old female with history of alcohol abuse, abstinent for past 6 years with chronic pancreatitis, splenic vein thrombosis, cirrhosis with worsening liver decompensation  MELD 3.0: 38 at 03/27/2022  6:43 AM MELD-Na: 33 at 03/27/2022  6:43 AM  Patient denies ongoing alcohol use Significantly elevated bilirubin and INR concerning for worsening liver failure  She had flu/COVID 2 to 3 weeks ago and feels lethargic since then, had dark urine since then  Diagnostic paracentesis to exclude SBP Significant leukocytosis, on ceftriaxone and Flagyl Upper quadrant ultrasound with Dopplers  to exclude portal vein thrombus  Check EtOH and PETH EtOH level  Even if this is acute alcohol related hepatitis and decompensation of cirrhosis, will need to to avoid steroids until we exclude any infection  Continue supportive care  Slowly advance diet as tolerated   The patient was provided an opportunity to ask questions and all were answered. The patient agreed with the plan and demonstrated an understanding of the instructions.  Kara [name] , MD [phone]

## 2022-03-27 NOTE — Progress Notes (Signed)
   04-11-22 2345  Assess: MEWS Score  Temp 99.1 F (37.3 C)  BP 98/67  MAP (mmHg) 78  Pulse Rate (!) 110  ECG Heart Rate (!) 112  Resp 20  Level of Consciousness Alert  SpO2 95 %  O2 Device Nasal Cannula  O2 Flow Rate (L/min) 2 L/min  Assess: MEWS Score  MEWS Temp 0  MEWS Systolic 1  MEWS Pulse 2  MEWS RR 0  MEWS LOC 0  MEWS Score 3  MEWS Score Color Yellow  Assess: if the MEWS score is Yellow or Red  Were vital signs taken at a resting state? Yes  Focused Assessment No change from prior assessment  Does the patient meet 2 or more of the SIRS criteria? No  MEWS guidelines implemented *See Row Information* Yes  Treat  MEWS Interventions Escalated (See documentation below)  Pain Scale 0-10  Pain Score 8  Pain Type Acute pain;Chronic pain  Pain Location Abdomen  Pain Orientation Right;Upper  Nausea relieved by Antiemetic  Take Vital Signs  Increase Vital Sign Frequency  Yellow: Q 2hr X 2 then Q 4hr X 2, if remains yellow, continue Q 4hrs  Escalate  MEWS: Escalate Yellow: discuss with charge nurse/RN and consider discussing with provider and RRT  Notify: Charge Nurse/RN  Name of Charge Nurse/RN Notified Angie  Date Charge Nurse/RN Notified 03/27/22  Time Charge Nurse/RN Notified 0022  Document  Patient Outcome Stabilized after interventions  Progress note created (see row info) Yes  Assess: SIRS CRITERIA  SIRS Temperature  0  SIRS Pulse 1  SIRS Respirations  0  SIRS WBC 1  SIRS Score Sum  2

## 2022-03-28 ENCOUNTER — Inpatient Hospital Stay (HOSPITAL_COMMUNITY): Payer: BC Managed Care – PPO

## 2022-03-28 DIAGNOSIS — G931 Anoxic brain damage, not elsewhere classified: Secondary | ICD-10-CM | POA: Diagnosis not present

## 2022-03-28 DIAGNOSIS — F1010 Alcohol abuse, uncomplicated: Secondary | ICD-10-CM

## 2022-03-28 DIAGNOSIS — Z8674 Personal history of sudden cardiac arrest: Secondary | ICD-10-CM

## 2022-03-28 DIAGNOSIS — K860 Alcohol-induced chronic pancreatitis: Secondary | ICD-10-CM

## 2022-03-28 DIAGNOSIS — K7031 Alcoholic cirrhosis of liver with ascites: Secondary | ICD-10-CM

## 2022-03-28 DIAGNOSIS — K7290 Hepatic failure, unspecified without coma: Secondary | ICD-10-CM | POA: Diagnosis not present

## 2022-03-28 DIAGNOSIS — K7460 Unspecified cirrhosis of liver: Secondary | ICD-10-CM

## 2022-03-28 DIAGNOSIS — R1011 Right upper quadrant pain: Secondary | ICD-10-CM | POA: Diagnosis not present

## 2022-03-28 DIAGNOSIS — Z7189 Other specified counseling: Secondary | ICD-10-CM

## 2022-03-28 DIAGNOSIS — R17 Unspecified jaundice: Secondary | ICD-10-CM | POA: Diagnosis not present

## 2022-03-28 DIAGNOSIS — K852 Alcohol induced acute pancreatitis without necrosis or infection: Secondary | ICD-10-CM

## 2022-03-28 DIAGNOSIS — Z515 Encounter for palliative care: Secondary | ICD-10-CM

## 2022-03-28 DIAGNOSIS — R7989 Other specified abnormal findings of blood chemistry: Secondary | ICD-10-CM | POA: Diagnosis not present

## 2022-03-28 DIAGNOSIS — K7682 Hepatic encephalopathy: Secondary | ICD-10-CM

## 2022-03-28 LAB — CBC
HCT: 31.3 % — ABNORMAL LOW (ref 36.0–46.0)
Hemoglobin: 10.7 g/dL — ABNORMAL LOW (ref 12.0–15.0)
MCH: 32.4 pg (ref 26.0–34.0)
MCHC: 34.2 g/dL (ref 30.0–36.0)
MCV: 94.8 fL (ref 80.0–100.0)
Platelets: 136 10*3/uL — ABNORMAL LOW (ref 150–400)
RBC: 3.3 MIL/uL — ABNORMAL LOW (ref 3.87–5.11)
RDW: 21.4 % — ABNORMAL HIGH (ref 11.5–15.5)
WBC: 21.5 10*3/uL — ABNORMAL HIGH (ref 4.0–10.5)
nRBC: 0 % (ref 0.0–0.2)

## 2022-03-28 LAB — RESPIRATORY PANEL BY PCR

## 2022-03-28 LAB — COMPREHENSIVE METABOLIC PANEL
ALT: 28 U/L (ref 0–44)
AST: 65 U/L — ABNORMAL HIGH (ref 15–41)
Albumin: 1.9 g/dL — ABNORMAL LOW (ref 3.5–5.0)
Alkaline Phosphatase: 274 U/L — ABNORMAL HIGH (ref 38–126)
Anion gap: 11 (ref 5–15)
BUN: 11 mg/dL (ref 6–20)
CO2: 20 mmol/L — ABNORMAL LOW (ref 22–32)
Calcium: 7.6 mg/dL — ABNORMAL LOW (ref 8.9–10.3)
Chloride: 106 mmol/L (ref 98–111)
Creatinine, Ser: 1.55 mg/dL — ABNORMAL HIGH (ref 0.44–1.00)
GFR, Estimated: 44 mL/min — ABNORMAL LOW (ref >60)
Glucose, Bld: 119 mg/dL — ABNORMAL HIGH (ref 70–99)
Potassium: 2.8 mmol/L — ABNORMAL LOW (ref 3.5–5.1)
Sodium: 137 mmol/L (ref 135–145)
Total Bilirubin: 28.3 mg/dL (ref 0.3–1.2)
Total Protein: 6 g/dL — ABNORMAL LOW (ref 6.5–8.1)

## 2022-03-28 LAB — SARS CORONAVIRUS 2 BY RT PCR: SARS Coronavirus 2 by RT PCR: NEGATIVE

## 2022-03-28 LAB — HIV ANTIBODY (ROUTINE TESTING W REFLEX): HIV Screen 4th Generation wRfx: NONREACTIVE

## 2022-03-28 LAB — MAGNESIUM: Magnesium: 2.1 mg/dL (ref 1.7–2.4)

## 2022-03-28 MED ORDER — ALBUMIN HUMAN 25 % IV SOLN
50.0000 g | Freq: Once | INTRAVENOUS | Status: AC
  Administered 2022-03-28: 12.5 g via INTRAVENOUS
  Filled 2022-03-28: qty 200

## 2022-03-28 MED ORDER — SODIUM CHLORIDE 0.9 % IV SOLN
100.0000 mg | Freq: Two times a day (BID) | INTRAVENOUS | Status: DC
  Administered 2022-03-28: 100 mg via INTRAVENOUS
  Filled 2022-03-28 (×2): qty 100

## 2022-03-28 MED ORDER — FUROSEMIDE 10 MG/ML IJ SOLN
40.0000 mg | Freq: Once | INTRAMUSCULAR | Status: AC
  Administered 2022-03-28: 40 mg via INTRAVENOUS
  Filled 2022-03-28: qty 4

## 2022-03-28 MED ORDER — MIDODRINE HCL 5 MG PO TABS
5.0000 mg | ORAL_TABLET | Freq: Three times a day (TID) | ORAL | Status: DC
  Administered 2022-03-28 – 2022-03-29 (×6): 5 mg via ORAL
  Filled 2022-03-28 (×6): qty 1

## 2022-03-28 MED ORDER — PREDNISOLONE 5 MG PO TABS
40.0000 mg | ORAL_TABLET | Freq: Every day | ORAL | Status: DC
  Administered 2022-03-28 – 2022-03-30 (×3): 40 mg via ORAL
  Filled 2022-03-28 (×4): qty 8

## 2022-03-28 MED ORDER — POTASSIUM CHLORIDE 20 MEQ PO PACK
40.0000 meq | PACK | ORAL | Status: AC
  Administered 2022-03-28 (×2): 40 meq via ORAL
  Filled 2022-03-28 (×2): qty 2

## 2022-03-28 MED ORDER — POTASSIUM CHLORIDE CRYS ER 20 MEQ PO TBCR
40.0000 meq | EXTENDED_RELEASE_TABLET | Freq: Once | ORAL | Status: AC
  Administered 2022-03-28: 40 meq via ORAL
  Filled 2022-03-28: qty 2

## 2022-03-28 MED ORDER — RIFAXIMIN 550 MG PO TABS
550.0000 mg | ORAL_TABLET | Freq: Three times a day (TID) | ORAL | Status: DC
  Administered 2022-03-28 – 2022-03-30 (×8): 550 mg via ORAL
  Filled 2022-03-28 (×13): qty 1

## 2022-03-28 MED ORDER — ORAL CARE MOUTH RINSE: 15.0000 mL | OROMUCOSAL | Status: DC | PRN

## 2022-03-28 MED ORDER — LACTULOSE 10 GM/15ML PO SOLN
30.0000 g | Freq: Three times a day (TID) | ORAL | Status: DC
  Administered 2022-03-28 – 2022-03-30 (×8): 30 g via ORAL
  Filled 2022-03-28 (×8): qty 45

## 2022-03-28 NOTE — Progress Notes (Signed)
MD notified of patient stool appearing to have blood in it. Orders placed for CBC in the morning.

## 2022-03-28 NOTE — Progress Notes (Signed)
Patient is having trouble maintaining her oxygen levels despite oxygen titration measures.  At 0730 she was on 3 liters by [place] and maintaining 88%. Titrated up 5 which did not seem to make much of a difference. Switched out Sharon for HFNC and bumped up to 7 Liters, now saturating 86-88%. Patient began to decline in o2 again, consistently in 85-87%. HFNC turned to 12. Patient does not appear to be in respiratory distress. Lungs are clear and dim.   RT called to bedside and rapid called to bedside. MD paged and came to bedside. Patient is now maintaining 91-92% on 12 L HFNC. CXR ordered and lasix given.  Continue to monitor.

## 2022-03-28 NOTE — Progress Notes (Signed)
Patient is jaundice. Patient has become more jaundice throughout this shift and this seemed to happen quite suddenly. This was reported to Dr. Elvera Lennox in person as he was rounding on the patient in the unit.

## 2022-03-28 NOTE — Progress Notes (Signed)
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  She is also noticed that she is becoming more jaundiced in the last week.  She reports occasional subjective fever and chills at home.  Significant events: 12/8-admit to the hospital  Significant imaging / results / micro data: 12/8-CT abdomen pelvis with mild peripancreatic fat stranding, portal hypertension, splenomegaly, large volume abdominal ascites and new low-attenuation lesion of the right lobe of the liver measuring 8 mm 12/9-right upper quadrant Doppler ultrasound with hepatic vasculature appearing patent, and hepatic parenchyma reflecting acute hepatitis  Subjective / 24h Interval events: States that she feels a little bit stronger.  No nausea.  Feels more alert.  Assesement and Plan: Active Problems:   LFT elevation   Chronic diastolic heart failure (HCC)   Chronic lower back pain   Sinus tachycardia   Splenic vein thrombosis   Chronic pancreatitis (HCC)   Gastroesophageal reflux disease   Asthma, severe persistent, well-controlled   Depression   History of cardiac arrest   Seizure disorder (HCC)   Tracheostomy status (HCC)   Anoxic brain injury ([place])   Jaundice  Principal problem Acute liver injury -looks more cholestatic in nature, AST and ALT are minimally elevated but alkaline phosphatase is 369 and total bilirubin was 26 on admission.  She is visibly jaundiced.  There is  also evidence of coagulopathy with INR of 3, and she has a significant elevation of WBC. -No clear-cut culprits, she is not using Tylenol at home, no herbal nutritional supplements or significant amounts of green tea.  She reiterates that she has been abstaining from EtOH for the past several years. -Gastroenterology consulted, appreciate input -Liver Dopplers without any significant thrombosis.  Liver parenchyma appears acute hepatitis but causative agent is not clear.  For completeness obtain COVID, respiratory virus panel, EBV, CMV -There is clear evidence of liver decompensation with coagulopathy, elevated ammonia.  Administer vitamin K today, repeat albumin, continue lactulose  Active problems SIRS-she is tachycardic, tachypneic, has a significantly elevated WBC.  She was placed on empiric antibiotics with ceftriaxone and metronidazole, continue antibiotics while monitoring cultures  Acute kidney injury-she has been intermittently hypotensive.  Creatinine this morning is 1.55.  She has undetectably low albumin, repeat albumin.  Placed on midodrine.  Urine sodium greater than 10 so unlikely hepatorenal  Mild hepatic encephalopathy-patient's, sleeping on my evaluation, she is alert and oriented x 4 but clearly has a degree of mild encephalopathy.  Continue lactulose.  Still with 1 beat asterixis this morning but clinically appears more alert  Acute on chronic pancreatitis -CT scan on admission showed mild peripancreatic stranding, lipase is elevated.  Continue IV fluids  Seizure disorder-continue home medications  Essential hypertension-she is on low-dose metoprolol  GERD - Holding PPI while we evaluate etiology of liver injury/disease   Anxiety, depression - Holding home BuSpar, Zoloft, trazodone for now while investigating etiology of acute liver injury   QT prolongation - History of this but QTc currently normal on EKG in ED.   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   Asthma - Continue home Pulmicort and albuterol  Chronic pain - Continue home oxycodone   History of cardiac arrest August 2023, mild anoxic brain injury, ataxia - History of cardiac arrest in July with evidence of anoxic brain injury on imaging at that time as well some ataxia, history of trach at that time as well, now decannulated.     Scheduled Meds:  budesonide  0.25 mg Nebulization BID   gabapentin  900 mg Oral BID   lacosamide  100 mg Oral BID   lactulose  20 g Oral TID   lipase/protease/amylase  24,000 Units Oral TID WC   magnesium oxide  400 mg Oral Daily   metoprolol tartrate  12.5 mg Oral BID   midodrine  5 mg Oral TID WC   potassium chloride  40 mEq Oral Once   sodium chloride flush  3 mL Intravenous Q12H   thiamine  100 mg Oral Daily   Continuous Infusions:  cefTRIAXone (ROCEPHIN)  IV Stopped (03/27/22 2134)   metronidazole Stopped (03/27/22 2159)   PRN Meds:.albuterol, HYDROmorphone (DILAUDID) injection, hydrOXYzine, lidocaine, metoCLOPramide (REGLAN) injection, mouth rinse, oxyCODONE, polyethylene glycol, polyvinyl alcohol  Current Outpatient Medications  Medication Instructions   albuterol (VENTOLIN HFA) 108 (90 Base) MCG/ACT inhaler 2 puffs, Inhalation, Every 6 hours PRN   atorvastatin (LIPITOR) 10 MG tablet TAKE 1 TABLET(10 MG) BY MOUTH AT BEDTIME   blood glucose meter kit and supplies KIT Dispense based on patient and insurance preference. Use up to four times daily as directed.   budesonide (PULMICORT) 0.25 mg, Nebulization, 2 times daily   busPIRone (BUSPAR) 5-10 mg, Oral, 3 times daily PRN, For anxiety- take 1-2 tabs as needed-   cetirizine (ZYRTEC) 10 MG tablet TAKE 1 TABLET(10 MG) BY MOUTH DAILY   diphenhydrAMINE (BENADRYL) 25 mg, Oral, Every 6 hours PRN   EPINEPHrine (EPIPEN 2-PAK) 0.3 mg, Intramuscular, Once PRN   folic  acid (FOLVITE) 1 MG tablet TAKE 1 TABLET(1 MG) BY MOUTH DAILY   gabapentin (NEURONTIN) 300 MG capsule TAKE 3 CAPSULES(900 MG) BY MOUTH TWICE DAILY   Lacosamide 100 mg, Oral, 2 times daily   lidocaine (LIDODERM) 5 % 1 patch, Transdermal, Every 24 hours, Remove & Discard patch within 12 hours or as directed by MD   magnesium oxide (MAG-OX) 400 (240 Mg) MG tablet TAKE 2 TABLETS(800 MG) BY MOUTH TWICE DAILY   melatonin 3 mg, Oral, Daily at bedtime   metFORMIN (GLUCOPHAGE) 500 mg, Oral, 2 times daily with meals   metoprolol tartrate (LOPRESSOR) 25 MG tablet TAKE 1 TABLET(25 MG) BY MOUTH TWICE DAILY   montelukast (SINGULAIR) 10 MG tablet TAKE ONE TABLET BY MOUTH AT BEDTIME   naloxone (NARCAN) nasal spray 4 mg/0.1 mL SMARTSIG:Both Nares   omeprazole (PRILOSEC) 40 MG capsule TAKE 1 CAPSULE(40 MG) BY MOUTH DAILY   oxyCODONE (ROXICODONE) 7.5 mg, Oral, Every 8 hours PRN, This is an increase from  5 mg to 7.5 mg three times a day as needed. Ninety pills were filled on 10/21/21 prior to your admission on 07/19. Now that you are taking less you should have almost a month supply.   Pancrelipase, Lip-Prot-Amyl, (ZENPEP) 20000-63000 units CPEP 2 capsules, Oral, 3 times daily   polycarbophil (FIBERCON) 625 mg, Oral, 2 times daily   polyvinyl alcohol (LIQUIFILM TEARS) 1.4 %  ophthalmic solution 1 drop, Both Eyes, As needed   Probiotic Product (DIGESTIVE ADVANTAGE) CAPS 1 capsule, Oral, Daily   saccharomyces boulardii (FLORASTOR) 250 mg, Oral, 2 times daily   sertraline (ZOLOFT) 100 mg, Oral, Daily   thiamine (VITAMIN B-1) 100 mg, Oral, Daily   traZODone (DESYREL) 75 mg, Oral, Daily at bedtime    Diet Orders (From admission, onward)     Start     Ordered   03/25/2022 1932  Diet clear liquid Room service appropriate? Yes; Fluid consistency: Thin  Diet effective now       Question Answer Comment  Room service appropriate? Yes   Fluid consistency: Thin      03/22/2022 1931            DVT prophylaxis: Place  and maintain sequential compression device Start: 03/27/22 1532 SCDs Start: 03/24/2022 1855   Lab Results  Component Value Date   PLT 136 (L) 03/28/2022      Code Status: Full Code  Family Communication: Husband at bedside  Status is: Inpatient  Remains inpatient appropriate because: Severity of illness  Level of care: Progressive  Consultants:  Gastroenterology  Objective: Vitals:   03/28/22 0600 03/28/22 0700 03/28/22 0759 03/28/22 0908  BP: 103/60     Pulse: (!) 103     Resp: 20     Temp:  99.3 F (37.4 C) 99.3 F (37.4 C)   TempSrc:  Oral Oral   SpO2: 91% (!) 88%  93%  Weight:      Height:        Intake/Output Summary (Last 24 hours) at 03/28/2022 1937 Last data filed at 03/28/2022 0913 Gross per 24 hour  Intake 3156.37 ml  Output 177 ml  Net 2979.37 ml    Wt Readings from Last 3 Encounters:  03/28/22 94.8 kg  01/26/22 88.5 kg  01/18/22 87.8 kg    Examination:  Constitutional: NAD, visibly icteric Eyes: lids and conjunctivae normal, + scleral icterus ENMT: mmm Neck: normal, supple Respiratory: clear to auscultation bilaterally, no wheezing, no crackles. Normal respiratory effort.  Cardiovascular: Regular rate and rhythm, no murmurs / rubs / gallops. No LE edema. Abdomen: soft, no distention, no tenderness. Bowel sounds positive.  Skin: no rashes Neurologic: no focal deficits, equal strength, 1 beat asterixis present   Data Reviewed: I have independently reviewed following labs and imaging studies   CBC Recent Labs  Lab 03/25/2022 1441 03/27/22 0643 03/28/22 0026  WBC 18.6* 24.2* 21.5*  HGB 12.1 11.9* 10.7*  HCT 34.9* 33.9* 31.3*  PLT 171 166 136*  MCV 92.6 91.9 94.8  MCH 32.1 32.2 32.4  MCHC 34.7 35.1 34.2  RDW 21.3* 21.5* 21.4*  LYMPHSABS 1.4  --   --   MONOABS 1.9*  --   --   EOSABS 0.0  --   --   BASOSABS 0.0  --   --      Recent Labs  Lab 04/10/2022 1441 03/28/2022 1552 03/27/22 0643 03/28/22 0026  NA 135  --  136 137  K  3.5  --  2.7* 2.8*  CL 101  --  100 106  CO2 21*  --  20* 20*  GLUCOSE 119*  --  108* 119*  BUN 8  --  8 11  CREATININE 0.96  --  1.30* 1.55*  CALCIUM 8.0*  --  7.7* 7.6*  AST 115*  --  84* 65*  ALT 39  --  31 28  ALKPHOS 369*  --  316* 274*  BILITOT 26.1*  --  25.3* 28.3*  ALBUMIN 1.6*  --  <1.5* 1.9*  MG  --   --  2.1 2.1  LATICACIDVEN 1.3  --   --   --   INR  --  3.0*  --   --   AMMONIA  --  67*  --   --      ------------------------------------------------------------------------------------------------------------------ No results for input(s): "CHOL", "HDL", "LDLCALC", "TRIG", "CHOLHDL", "LDLDIRECT" in the last 72 hours.  Lab Results  Component Value Date   HGBA1C 4.5 (L) 11/27/2021   ------------------------------------------------------------------------------------------------------------------ No results for input(s): "TSH", "T4TOTAL", "T3FREE", "THYROIDAB" in the last 72 hours.  Invalid input(s): "FREET3"  Cardiac Enzymes No results for input(s): "CKMB", "TROPONINI", "MYOGLOBIN" in the last 168 hours.  Invalid input(s): "CK" ------------------------------------------------------------------------------------------------------------------    Component Value Date/Time   BNP 252.7 (H) 11/13/2021 1930    CBG: No results for input(s): "GLUCAP" in the last 168 hours.  Recent Results (from the past 240 hour(s))  Blood culture (routine x 2)     Status: None (Preliminary result)   Collection Time: 04/06/2022  2:41 PM   Specimen: BLOOD  Result Value Ref Range Status   Specimen Description BLOOD SITE NOT SPECIFIED  Final   Special Requests   Final    BOTTLES DRAWN AEROBIC AND ANAEROBIC Blood Culture results may not be optimal due to an inadequate volume of blood received in culture bottles   Culture   Final    NO GROWTH < 24 HOURS Performed at [facility], [address] [place], [place] [address]    Report Status PENDING  Incomplete  MRSA Next Gen by  PCR, Nasal     Status: None   Collection Time: 04/08/2022 11:54 PM   Specimen: Nasal Mucosa; Nasal Swab  Result Value Ref Range Status   MRSA by PCR Next Gen NOT DETECTED NOT DETECTED Final    Comment: (NOTE) The GeneXpert MRSA Assay (FDA approved for NASAL specimens only), is one component of a comprehensive MRSA colonization surveillance program. It is not intended to diagnose MRSA infection nor to guide or monitor treatment for MRSA infections. Test performance is not FDA approved in patients less than 33 years old. Performed at [facility], [place] [address], [place], [place] [address]   C Difficile Quick Screen w PCR reflex     Status: None   Collection Time: 03/27/22  7:23 AM   Specimen: STOOL  Result Value Ref Range Status   C Diff antigen NEGATIVE NEGATIVE Final   C Diff toxin NEGATIVE NEGATIVE Final   C Diff interpretation No C. difficile detected.  Final    Comment: Performed at [facility], [place] [address], [place], [place] [address]  Gastrointestinal Panel by PCR , Stool     Status: None   Collection Time: 03/27/22  7:23 AM   Specimen: STOOL  Result Value Ref Range Status   Campylobacter species NOT DETECTED NOT DETECTED Final   Plesimonas shigelloides NOT DETECTED NOT DETECTED Final   Salmonella species NOT DETECTED NOT DETECTED Final   Yersinia enterocolitica NOT DETECTED NOT DETECTED Final   Vibrio species NOT DETECTED NOT DETECTED Final   Vibrio cholerae NOT DETECTED NOT DETECTED Final   Enteroaggregative E coli (EAEC) NOT DETECTED NOT DETECTED Final   Enteropathogenic E coli (EPEC) NOT DETECTED NOT DETECTED Final   Enterotoxigenic E coli (ETEC) NOT DETECTED NOT DETECTED Final   Shiga like toxin producing E coli (STEC) NOT DETECTED NOT  DETECTED Final   Shigella/Enteroinvasive E coli (EIEC) NOT DETECTED NOT DETECTED Final   Cryptosporidium NOT DETECTED NOT DETECTED Final   Cyclospora cayetanensis NOT DETECTED NOT DETECTED Final   Entamoeba histolytica NOT  DETECTED NOT DETECTED Final   Giardia lamblia NOT DETECTED NOT DETECTED Final   Adenovirus F40/41 NOT DETECTED NOT DETECTED Final   Astrovirus NOT DETECTED NOT DETECTED Final   Norovirus GI/GII NOT DETECTED NOT DETECTED Final   Rotavirus A NOT DETECTED NOT DETECTED Final   Sapovirus (I, II, IV, and V) NOT DETECTED NOT DETECTED Final    Comment: Performed at [facility], [address] [place], [place] [address]  Gram stain     Status: None   Collection Time: 03/27/22  9:28 AM   Specimen: Peritoneal Washings  Result Value Ref Range Status   Specimen Description PERITONEAL  Final   Special Requests NONE  Final   Gram Stain   Final    NO ORGANISMS SEEN NO WBC SEEN Performed at [facility], [place] [address] [place], [place] [address]    Report Status 03/27/2022 FINAL  Final     Radiology Studies: US ABDOMEN LIMITED WITH LIVER DOPPLER  Result Date: 03/27/2022 CLINICAL DATA:  Acute liver injury EXAM: DUPLEX ULTRASOUND OF LIVER TECHNIQUE: Color and duplex Doppler ultrasound was performed to evaluate the hepatic in-flow and out-flow vessels. COMPARISON:  CT abdomen and pelvis dated 04/02/2022 FINDINGS: Liver: Heterogeneous echogenic parenchyma. Normal hepatic contour without nodularity. No focal lesion, mass or intrahepatic biliary ductal dilatation. Main Portal Vein size: 1.7 cm Portal Vein Velocities Main Prox:  12.4 cm/sec Main Mid: 13.6 cm/sec Main Dist:  12.2 cm/sec Right: 12.9 cm/sec Left: 5.7 cm/sec, apparently reversed direction Hepatic Vein Velocities Right:  79.5 cm/sec Middle:  47.9 cm/sec Left:  5.7 cm/sec IVC: Present and patent with normal respiratory phasicity. Hepatic Artery Velocity:  60.6 cm/sec Splenic Vein Velocity:  23.5 cm/sec Spleen: 15.3 cm x 11.8 cm x 5.3 cm with a total volume of 518.6 cm^3 (411 cm^3 is upper limit normal) Portal Vein Occlusion/Thrombus: No Splenic Vein Occlusion/Thrombus: No Ascites: Small volume ascites. Varices: None IMPRESSION: 1.  Technically challenging examination for evaluation of the portal venous flow, however hepatic vasculature appears patent with appropriate direction of flow with the exception of apparent reversal of flow in the left portal vein, suggestive of portal hypertension. 2. Heterogeneously echogenic hepatic parenchyma likely reflects acute hepatitis. 3. Small volume ascites. 4. Splenomegaly. Electronically Signed   By: Darrin Nipper M.D.   On: 03/27/2022 11:39   US Paracentesis  Result Date: 03/27/2022 INDICATION: Cirrhosis with ascites. Request for diagnostic and therapeutic paracentesis. EXAM: ULTRASOUND GUIDED RIGHT LOWER QUADRANT PARACENTESIS MEDICATIONS: 1% plain lidocaine, 5 mL COMPLICATIONS: None immediate. PROCEDURE: Informed written consent was obtained from the patient after a discussion of the risks, benefits and alternatives to treatment. A timeout was performed prior to the initiation of the procedure. Initial ultrasound scanning demonstrates a moderate amount of ascites within the right lower abdominal quadrant. The right lower abdomen was prepped and draped in the usual sterile fashion. 1% lidocaine was used for local anesthesia. Following this, a 19 gauge, 7-cm, Yueh catheter was introduced. An ultrasound image was saved for documentation purposes. The paracentesis was performed. The catheter was removed and a dressing was applied. The patient tolerated the procedure well without immediate post procedural complication. FINDINGS: A total of approximately 1810 mL of clear yellow fluid was removed. Samples were sent to the laboratory as requested by the  clinical team. IMPRESSION: Successful ultrasound-guided paracentesis yielding 1.8 liters of peritoneal fluid. Read by: Ascencion Dike PA-C PLAN: If the patient eventually requires >/=2 paracenteses in a 30 day period, candidacy for formal evaluation by the [facility] will be assessed. Electronically Signed   By:  Ruthann Cancer M.D.   On: 03/27/2022 11:26     Marzetta Board, MD, PhD [facility]  Between 7 am - 7 pm I am available, please contact me via Amion (for emergencies) or Securechat (non urgent messages)  Between 7 pm - 7 am I am not available, please contact night coverage MD/APP via Amion

## 2022-03-28 NOTE — Consult Note (Signed)
Palliative Care Consult Note                                  Date: 03/28/2022   Patient Name: Kara [name]  DOB: 09/08/1985  MRN: [id number]  Age / Sex: 36 y.o., female  PCP: Lawerance Cruel, MD Referring Physician: Caren Griffins, MD  Reason for Consultation: Establishing goals of care  HPI/Patient Profile: 36 y.o. 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 She presented to [facility] ED on 03/21/2022 with abdominal pain, nausea, vomiting, and diarrhea. She was admitted to [facility] service with jaundice, LFT elevation, and acute liver disease. Palliative Medicine was consulted for goals of care.   Past Medical History:  Diagnosis Date   Alcoholism in remission ([place])    per pt in remission since 2017   Anemia    AR (allergic rhinitis)    allergist-- dr Neldon Mc   Biliary dyskinesia    CAP (community acquired pneumonia) 06/13/2016   Cardiac arrest ([place]) 09/64/3838   Diastolic CHF ([employer])    followd by dr Edmonia James   Environmental allergies    FUO (fever of unknown origin)    Generalized tonic-clonic seizure ([place]) 01/20/2021   GERD (gastroesophageal reflux disease)    HCAP (healthcare-associated pneumonia) 07/31/2016   Headache    History of acute pancreatitis    alcoholic pancreatitis ,  1840 and 2017   History of sepsis 06/12/2016   with CAP   History of syncope    History of thrombosis 01/29/2015   splenic vein thrombosis-- treated with coumadin--- readmitted for abd. hematoma due to coumadin treated with IR embolization of the IMA branches   Hypertension    Hypokalemia 04/08/2014   Hypomagnesemia 12/15/2021   Hyponatremia 08/27/2016   Hypothermia 07/16/2014    Polyneuropathy    Prolonged QT syndrome    cardiologist-- dr Johnsie Cancel (notes in epic)   Respiratory failure with hypoxia ([place]) 07/31/2016   Sepsis ([place]) 06/12/2016   Severe persistent asthma    pulmonologist-- dr dr Bernita Raisin (notes in epic)    Subjective:   I have reviewed medical records including progress notes, labs and imaging, and assessed the patient at bedside. She is lethargic with mumbled speech and not able to participate in meaningful conversation. Currently on 12L oxygen. Frequent jerking/twitching noted.   I met with her husband Winferd Humphrey to discuss diagnosis, prognosis, and goals of care. I introduced Palliative Medicine as specialized medical care for people living with serious illness.   A brief life review was discussed. Rachna and Winferd Humphrey have been married for 17 years. They have 6 dashund dogs that they love greatly. 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 She has not been able to work since her cardiac arrest in July, and has been on short-term disability.  Winferd Humphrey reports that Areil has dealt with significant fatigue since the cardiac arrest and subsequent prolonged period of recovery and rehab.   We discussed Kielyn's current clinical status. I shared my concern regarding her worsening mental status. Natural disease trajectory of advanced liver disease was discussed.  I spent time reviewing pertinent labs with Winferd Humphrey. Provided education on MELD score, and discussed that Lenetta's score is very elevated at 39.  Winferd Humphrey expresses understanding of the seriousness of Azharia's current medical situation. He is appropriately emotional and tearful.   Winferd Humphrey shares that Ranita's sister and mother are traveling from [place], arriving by tomorrow morning. He reports that the sister has been asking about transplant options.   Briefly discussed that a person needs to be sober for at least 6 months to be a candidate for liver transplant and that  [facility] admitted to the GI physician that she unfortunately had been using alcohol.   We did discuss code status. For now, Winferd Humphrey wants Shilo to remain full code status to allow opportunity for her mother and sister to visit.   Review of Systems  Unable to perform ROS   Objective:   Primary Diagnoses: Present on Admission:  Sinus tachycardia  Splenic vein thrombosis  Gastroesophageal reflux disease  Depression  Chronic pancreatitis (HCC)  Chronic diastolic heart failure (HCC)  Chronic lower back pain  Asthma, severe persistent, well-controlled  Anoxic brain injury ([place])  LFT elevation  Alcohol induced acute pancreatitis without necrosis or infection   Physical Exam Vitals reviewed.  Constitutional:      General: She is not in acute distress.    Appearance: She is ill-appearing.  Pulmonary:     Effort: Pulmonary effort is normal.  Skin:    Coloration: Skin is jaundiced.  Neurological:     Mental Status: She is lethargic.    Vital Signs:  BP (!) 89/65   Pulse 97   Temp 98.4 F (36.9 C) (Oral)   Resp 19   Ht _0  (1.651 m)   Wt 94.8 kg   LMP  (LMP Unknown)   SpO2 92%   BMI 34.78 kg/m   Palliative Assessment/Data: PPS 20%     Assessment & Plan:   SUMMARY OF RECOMMENDATIONS   Full code for now Continue full scope interventions Patient's mother and sister are arriving tomorrow Plan for follow-up with family tomorrow  Primary Decision Maker: Spouse    Discussed with: Dr. Cruzita Lederer   Thank you for allowing [place] to participate in the care of [name]  MDM - [place]   Signed by: Elie Confer, NP Palliative Medicine Team  Team Phone # [phone]  For individual providers, please see AMION

## 2022-03-28 NOTE — Progress Notes (Addendum)
[facility] Progress Note  CC:  Cirrhosis, liver failure  Subjective:  Had a temp of 100.6.  Admitted to Dr. Lavon Paganini after a lot of probing that she had been drinking "mixed drinks" but would not elaborate.  Having abdominal pain.  Objective:  Vital signs in last 24 hours: Temp:  [98.2 F (36.8 C)-100.6 F (38.1 C)] 99.3 F (37.4 C) (12/10 0759) Pulse Rate:  [99-204] 103 (12/10 0600) Resp:  [18-36] 20 (12/10 0600) BP: (87-106)/(40-76) 103/60 (12/10 0600) SpO2:  [87 %-98 %] 93 % (12/10 0908) Weight:  [94.8 kg] 94.8 kg (12/10 0200) Last BM Date : 03/27/22 General:  Alert, chronically ill-appearing, in NAD; deep jaundice noted. Heart:  Tachy but regular; no murmurs Pulm:  CTAB.  No W/R/R. Abdomen:  Distended.  BS present.  Diffuse TTP.   Extremities:  Without edema. Neurologic:  Asterixis noted.  Intake/Output from previous day: 12/09 0701 - 12/10 0700 In: 3096.4 [P.O.:780; I.V.:2111.6; IV Piggyback:204.8] Out: 177 [Urine:175; Stool:2]  Lab Results: Recent Labs    03/24/2022 1441 03/27/22 0643 03/28/22 0026  WBC 18.6* 24.2* 21.5*  HGB 12.1 11.9* 10.7*  HCT 34.9* 33.9* 31.3*  PLT 171 166 136*   BMET Recent Labs    03/22/2022 1441 03/27/22 0643 03/28/22 0026  NA 135 136 137  K 3.5 2.7* 2.8*  CL 101 100 106  CO2 21* 20* 20*  GLUCOSE 119* 108* 119*  BUN 8 8 11   CREATININE 0.96 1.30* 1.55*  CALCIUM 8.0* 7.7* 7.6*   LFT Recent Labs    03/27/22 0643 03/28/22 0026  PROT 5.9* 6.0*  ALBUMIN <1.5* 1.9*  AST 84* 65*  ALT 31 28  ALKPHOS 316* 274*  BILITOT 25.3* 28.3*  BILIDIR 15.5*  --    PT/INR Recent Labs    03/28/2022 1552  LABPROT 31.0*  INR 3.0*   Hepatitis Panel Recent Labs    03/27/22 0643  HEPBSAG NON REACTIVE  HCVAB NON REACTIVE  HEPAIGM NON REACTIVE  HEPBIGM NON REACTIVE    14/09/23 ABDOMEN LIMITED WITH LIVER DOPPLER  Result Date: 03/27/2022 CLINICAL DATA:  Acute liver injury EXAM: DUPLEX ULTRASOUND OF LIVER TECHNIQUE: Color  and duplex Doppler ultrasound was performed to evaluate the hepatic in-flow and out-flow vessels. COMPARISON:  CT abdomen and pelvis dated 04/05/2022 FINDINGS: Liver: Heterogeneous echogenic parenchyma. Normal hepatic contour without nodularity. No focal lesion, mass or intrahepatic biliary ductal dilatation. Main Portal Vein size: 1.7 cm Portal Vein Velocities Main Prox:  12.4 cm/sec Main Mid: 13.6 cm/sec Main Dist:  12.2 cm/sec Right: 12.9 cm/sec Left: 5.7 cm/sec, apparently reversed direction Hepatic Vein Velocities Right:  79.5 cm/sec Middle:  47.9 cm/sec Left:  5.7 cm/sec IVC: Present and patent with normal respiratory phasicity. Hepatic Artery Velocity:  60.6 cm/sec Splenic Vein Velocity:  23.5 cm/sec Spleen: 15.3 cm x 11.8 cm x 5.3 cm with a total volume of 518.6 cm^3 (411 cm^3 is upper limit normal) Portal Vein Occlusion/Thrombus: No Splenic Vein Occlusion/Thrombus: No Ascites: Small volume ascites. Varices: None IMPRESSION: 1. Technically challenging examination for evaluation of the portal venous flow, however hepatic vasculature appears patent with appropriate direction of flow with the exception of apparent reversal of flow in the left portal vein, suggestive of portal hypertension. 2. Heterogeneously echogenic hepatic parenchyma likely reflects acute hepatitis. 3. Small volume ascites. 4. Splenomegaly. Electronically Signed   By: 14/11/2021 M.D.   On: 03/27/2022 11:39   14/12/2021 Paracentesis  Result Date: 03/27/2022 INDICATION: Cirrhosis with ascites. Request for diagnostic and  therapeutic paracentesis. EXAM: ULTRASOUND GUIDED RIGHT LOWER QUADRANT PARACENTESIS MEDICATIONS: 1% plain lidocaine, 5 mL COMPLICATIONS: None immediate. PROCEDURE: Informed written consent was obtained from the patient after a discussion of the risks, benefits and alternatives to treatment. A timeout was performed prior to the initiation of the procedure. Initial ultrasound scanning demonstrates a moderate amount of ascites within  the right lower abdominal quadrant. The right lower abdomen was prepped and draped in the usual sterile fashion. 1% lidocaine was used for local anesthesia. Following this, a 19 gauge, 7-cm, Yueh catheter was introduced. An ultrasound image was saved for documentation purposes. The paracentesis was performed. The catheter was removed and a dressing was applied. The patient tolerated the procedure well without immediate post procedural complication. FINDINGS: A total of approximately 1810 mL of clear yellow fluid was removed. Samples were sent to the laboratory as requested by the clinical team. IMPRESSION: Successful ultrasound-guided paracentesis yielding 1.8 liters of peritoneal fluid. Read by: Ascencion Dike PA-C PLAN: If the patient eventually requires >/=2 paracenteses in a 30 day period, candidacy for formal evaluation by the [facility] will be assessed. Electronically Signed   By: Ruthann Cancer M.D.   On: 03/27/2022 11:26   CT ABDOMEN PELVIS W CONTRAST  Result Date: 03/23/2022 CLINICAL DATA:  Right lower quadrant abdominal pain EXAM: CT ABDOMEN AND PELVIS WITH CONTRAST TECHNIQUE: Multidetector CT imaging of the abdomen and pelvis was performed using the standard protocol following bolus administration of intravenous contrast. RADIATION DOSE REDUCTION: This exam was performed according to the departmental dose-optimization program which includes automated exposure control, adjustment of the mA and/or kV according to patient size and/or use of iterative reconstruction technique. CONTRAST:  37mL OMNIPAQUE IOHEXOL 350 MG/ML SOLN COMPARISON:  CT abdomen dated December 01, 2021 FINDINGS: Lower chest: Right lower lobe atelectasis.  Small hiatal hernia. Hepatobiliary: Heterogeneous enhancement of the liver parenchyma, similar to prior exam and likely due to hepatic steatosis. New low-attenuation lesion of the right lobe of the liver measuring 8 mm on series 3,  image 44. Gallbladder is surgically absent. Pancreas: No pancreatic duct dilation. Mild peripancreatic fat stranding, increased when compared with the prior exam. Spleen: Unchanged splenomegaly Adrenals/Urinary Tract: Bilateral adrenal glands are unremarkable. No hydronephrosis or nephrolithiasis. Bladder is unremarkable. Stomach/Bowel: Stomach is within normal limits. No evidence of bowel wall thickening, distention, or inflammatory changes. Vascular/Lymphatic: No significant vascular findings are present. No enlarged abdominal or pelvic lymph nodes. Reproductive: Uterus and bilateral adnexa are unremarkable. Other: Small collection located posterior to the wall of the ascending colon measuring 3.5 x 2.1 cm on series 3 image 57, unchanged when compared with prior exam and likely sequela of prior retroperitoneal hematoma. Musculoskeletal: No acute or significant osseous findings. IMPRESSION: 1. Mild peripancreatic fat stranding, increased when compared with the prior exam, findings may be due to acute pancreatitis but could also be related to mesenteric edema. Correlate with lipase. 2. Sequela of portal hypertension including splenomegaly and increased large volume abdominal ascites. 3. New low-attenuation lesion of the right lobe of the liver measuring 8 mm, may be due to a simple cyst, however since finding is new when compared with prior exam further evaluation is recommended with contrast-enhanced liver MRI. This can be performed non emergently. Electronically Signed   By: Yetta Glassman M.D.   On: 03/31/2022 17:17    Assessment / Plan: *Liver failure in the setting of chronic liver disease/cirrhosis: She has splenomegaly and ascites.  Paracentesis with 1.8 Liters removed, negative for  SBP.  Hepatitis panel is negative.  Labs for other causes of chronic liver disease had been performed in the past within the past couple years and were negative.  She had flu/COVID 2 to 3 weeks ago.  Ultrasound with doppler  shows vascular patency.  She also admitted to drinking "mixed drinks" so that is the cause for her decompensation. *Hypokalemia with potassium 2.8 *Leukocytosis up to 21.5 K *Coagulopathy with INR 3.0 *AKI:  Cr higher today at 1.55.  Urine sodium is 26. *Acute on chronic pancreatitis:  On pancreatic enzymes at home. 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  -Agree with empiric coverage with ceftriaxone and Flagyl for now.  -Supportive care. -Will start prednisolone 40 mg daily for acute ETOH hepatitis with DF of close to 110 to see if that helps. -Lactulose titrated to 3 soft BMs daily.  Is on 20 grams TID currently. -Poor prognosis.   MELD 3.0: 39 at 03/28/2022 12:26 AM MELD-Na: 36 at 03/28/2022 12:26 AM Calculated from: Serum Creatinine: 1.55 mg/dL at 03/28/2022 12:26 AM Serum Sodium: 137 mmol/L at 03/28/2022 12:26 AM Total Bilirubin: 28.3 mg/dL at 03/28/2022 12:26 AM Serum Albumin: 1.9 g/dL at 03/28/2022 12:26 AM INR(ratio): 3.0 at 04/16/2022  3:52 PM Age at listing (hypothetical): 36 years Sex: Female at 03/28/2022 12:26 AM    LOS: 2 days   Laban Emperor. Zehr  03/28/2022, 9:11 AM   Attending physician's note   I have taken a history, reviewed the chart and examined the patient. I performed a substantive portion of this encounter, including complete performance of at least one of the key components, in conjunction with the APP. I agree with the APP's note, impression and recommendations.    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 Decompensated with jaundice, hepatic encephalopathy and ascites MELD 3.0: 39 at 03/28/2022 12:26 AM MELD-Na: 36 at 03/28/2022 12:26 AM Discriminant function score >32 Will empirically start on prednisone 40 mg daily for acute alcohol induced hepatitis No clear source of infection  Right upper  quadrant ultrasound with Doppler negative for portal vein thrombus No SBP No evidence of active GI bleed  Titrate lactulose to 3 soft bowel movements daily  Low-grade fever and leukocytosis likely in the setting of acute pancreatitis Alcohol induced acute on chronic pancreatitis  Palliative care consult to discuss goals of care  Overall prognosis is extremely poor   The patient and husband at bedside was provided an opportunity to ask questions and all were answered. They agreed with the plan and demonstrated an understanding of the instructions.   Damaris Hippo , MD [phone]

## 2022-03-28 NOTE — Progress Notes (Signed)
Called to pt room due to Spo2 dropping to 88% and increased O2 demand.  Pt SPO2 waveform was not consistent due to hand movement.  When using my pulse ox SPO2 on 12L was 92% consistently.  Pt is sleepy but arouses and answers questions appropriately. Has received dilauded, neurontin, atarax and Oxycidone within last 3 hours.  Pt on 12L [place], mouth breathing.  Husband states pt has not been in respiratory distress.  MD at bedside.  Will continue to monitor pt for increased O2 demands.  CXR to be ordered by MD

## 2022-03-28 NOTE — Significant Event (Signed)
Rapid Response Event Note   Reason for Call :  spO2 88% with increased O2 needs throughout shift.   Initial Focused Assessment:  On arrival, pt resting in bed. Lethargic but arousable, spO2 91-92% on 12L [place]. Lungs clear/diminished throughout with shallow breaths noted. Pt denies being SOB, Abd distended/taut.   Interventions:  Changed spO2 sensor CXR Md to bedside  Plan of Care:  Continue to monitor pt status. Room to go up on O2 if needed. May need to change to face mask if pt continues to breathe thru mouth while asleep. Awaiting palliative consult.   Call with any changes or concerns.    Event Summary:   MD Notified: PTA. Dr Elvera Lennox to bedside Call Time: 1238 Arrival Time: 1241 End Time: 1315  Mordecai Rasmussen, RN

## 2022-03-29 ENCOUNTER — Inpatient Hospital Stay (HOSPITAL_COMMUNITY): Payer: BC Managed Care – PPO

## 2022-03-29 DIAGNOSIS — R17 Unspecified jaundice: Secondary | ICD-10-CM | POA: Diagnosis not present

## 2022-03-29 DIAGNOSIS — R7989 Other specified abnormal findings of blood chemistry: Secondary | ICD-10-CM | POA: Diagnosis not present

## 2022-03-29 DIAGNOSIS — R1011 Right upper quadrant pain: Secondary | ICD-10-CM | POA: Diagnosis not present

## 2022-03-29 DIAGNOSIS — K7682 Hepatic encephalopathy: Secondary | ICD-10-CM | POA: Diagnosis not present

## 2022-03-29 DIAGNOSIS — G931 Anoxic brain damage, not elsewhere classified: Secondary | ICD-10-CM | POA: Diagnosis not present

## 2022-03-29 DIAGNOSIS — F10988 Alcohol use, unspecified with other alcohol-induced disorder: Secondary | ICD-10-CM

## 2022-03-29 DIAGNOSIS — K7031 Alcoholic cirrhosis of liver with ascites: Secondary | ICD-10-CM | POA: Diagnosis not present

## 2022-03-29 DIAGNOSIS — K7011 Alcoholic hepatitis with ascites: Secondary | ICD-10-CM

## 2022-03-29 LAB — CBC WITH DIFFERENTIAL/PLATELET
Abs Immature Granulocytes: 0 10*3/uL (ref 0.00–0.07)
Basophils Absolute: 0 10*3/uL (ref 0.0–0.1)
Basophils Relative: 0 %
Eosinophils Absolute: 0 10*3/uL (ref 0.0–0.5)
Eosinophils Relative: 0 %
HCT: 30.6 % — ABNORMAL LOW (ref 36.0–46.0)
Hemoglobin: 10.4 g/dL — ABNORMAL LOW (ref 12.0–15.0)
Lymphocytes Relative: 2 %
Lymphs Abs: 0.4 10*3/uL — ABNORMAL LOW (ref 0.7–4.0)
MCH: 32.4 pg (ref 26.0–34.0)
MCHC: 34 g/dL (ref 30.0–36.0)
MCV: 95.3 fL (ref 80.0–100.0)
Monocytes Absolute: 0.9 10*3/uL (ref 0.1–1.0)
Monocytes Relative: 4 %
Neutro Abs: 20.3 10*3/uL — ABNORMAL HIGH (ref 1.7–7.7)
Neutrophils Relative %: 94 %
Platelets: 141 10*3/uL — ABNORMAL LOW (ref 150–400)
RBC: 3.21 MIL/uL — ABNORMAL LOW (ref 3.87–5.11)
RDW: 21.2 % — ABNORMAL HIGH (ref 11.5–15.5)
WBC: 21.6 10*3/uL — ABNORMAL HIGH (ref 4.0–10.5)
nRBC: 0 % (ref 0.0–0.2)
nRBC: 0 /100 WBC

## 2022-03-29 LAB — COMPREHENSIVE METABOLIC PANEL
ALT: 24 U/L (ref 0–44)
AST: 56 U/L — ABNORMAL HIGH (ref 15–41)
Albumin: 2 g/dL — ABNORMAL LOW (ref 3.5–5.0)
Alkaline Phosphatase: 243 U/L — ABNORMAL HIGH (ref 38–126)
Anion gap: 13 (ref 5–15)
BUN: 10 mg/dL (ref 6–20)
CO2: 21 mmol/L — ABNORMAL LOW (ref 22–32)
Calcium: 7.9 mg/dL — ABNORMAL LOW (ref 8.9–10.3)
Chloride: 107 mmol/L (ref 98–111)
Creatinine, Ser: 1.27 mg/dL — ABNORMAL HIGH (ref 0.44–1.00)
GFR, Estimated: 56 mL/min — ABNORMAL LOW (ref >60)
Glucose, Bld: 152 mg/dL — ABNORMAL HIGH (ref 70–99)
Potassium: 2.6 mmol/L — CL (ref 3.5–5.1)
Sodium: 141 mmol/L (ref 135–145)
Total Bilirubin: 26.8 mg/dL (ref 0.3–1.2)
Total Protein: 6.1 g/dL — ABNORMAL LOW (ref 6.5–8.1)

## 2022-03-29 LAB — PROTIME-INR
INR: 4.2 (ref 0.8–1.2)
Prothrombin Time: 39.9 seconds — ABNORMAL HIGH (ref 11.4–15.2)

## 2022-03-29 LAB — POTASSIUM: Potassium: 3 mmol/L — ABNORMAL LOW (ref 3.5–5.1)

## 2022-03-29 LAB — CMV IGM: CMV IgM: <30 AU/mL (ref 0.0–29.9)

## 2022-03-29 MED ORDER — PHYTONADIONE 5 MG PO TABS
10.0000 mg | ORAL_TABLET | Freq: Once | ORAL | Status: AC
  Administered 2022-03-29: 10 mg via ORAL
  Filled 2022-03-29: qty 2

## 2022-03-29 MED ORDER — POTASSIUM CHLORIDE CRYS ER 20 MEQ PO TBCR
40.0000 meq | EXTENDED_RELEASE_TABLET | ORAL | Status: AC
  Administered 2022-03-29 (×2): 40 meq via ORAL
  Filled 2022-03-29 (×2): qty 2

## 2022-03-29 MED ORDER — FUROSEMIDE 10 MG/ML IJ SOLN
60.0000 mg | Freq: Once | INTRAMUSCULAR | Status: AC
  Administered 2022-03-29: 60 mg via INTRAVENOUS
  Filled 2022-03-29: qty 6

## 2022-03-29 MED ORDER — POTASSIUM CHLORIDE CRYS ER 20 MEQ PO TBCR
40.0000 meq | EXTENDED_RELEASE_TABLET | ORAL | Status: AC
  Administered 2022-03-29 (×3): 40 meq via ORAL
  Filled 2022-03-29 (×3): qty 2

## 2022-03-29 MED ORDER — ALBUMIN HUMAN 25 % IV SOLN
25.0000 g | Freq: Once | INTRAVENOUS | Status: AC
  Administered 2022-03-29: 25 g via INTRAVENOUS
  Filled 2022-03-29: qty 100

## 2022-03-29 MED ORDER — POTASSIUM CHLORIDE 10 MEQ/100ML IV SOLN
10.0000 meq | INTRAVENOUS | Status: AC
  Administered 2022-03-29 (×3): 10 meq via INTRAVENOUS
  Filled 2022-03-29 (×3): qty 100

## 2022-03-29 NOTE — Progress Notes (Addendum)
Date and time results received: 03/29/22 0235   Test: INR  Critical Value: 4.2  Name of Provider Notified: Dr. Imogene Burn  Orders Received? Or Actions Taken?: 10mg  PO vitamin K

## 2022-03-29 NOTE — Progress Notes (Signed)
Patient unable to maintain oxygen saturations above 88% on 15L HFNC. RT notified. Suggested to try NRB.  NRB mask placed on patient and saturations improved to 95%.

## 2022-03-29 NOTE — Progress Notes (Signed)
Progress Note   Subjective  Patient can describe where she is, but is confused to some questions. She is hypoxic, possible ARDS, dyspnic. She is eating / drinking per family and taking medications PO. Mental status fluctuates, some times seems more clear than otherwise. Palliative care has seen.    Objective   Vital signs in last 24 hours: Temp:  [98.4 F (36.9 C)-99.4 F (37.4 C)] 99.4 F (37.4 C) (12/11 0744) Pulse Rate:  [93-108] 108 (12/11 0744) Resp:  [12-27] 17 (12/11 0744) BP: (89-107)/(48-75) 95/71 (12/11 0744) SpO2:  [88 %-94 %] 91 % (12/11 0903) Weight:  [95.7 kg] 95.7 kg (12/11 0312) Last BM Date : 03/28/22 General:    white female in NAD, severely jaundiced. Lungs: Respirations labored Abdomen:  Soft, nontender  Extremities:  Without edema. Neurologic:  can answer some questions but is altered    Intake/Output from previous day: 12/10 0701 - 12/11 0700 In: 460 [P.O.:360; IV Piggyback:100] Out: 475 [Urine:475] Intake/Output this shift: No intake/output data recorded.  Lab Results: Recent Labs    03/27/22 0643 03/28/22 0026 03/29/22 0034  WBC 24.2* 21.5* 21.6*  HGB 11.9* 10.7* 10.4*  HCT 33.9* 31.3* 30.6*  PLT 166 136* 141*   BMET Recent Labs    03/27/22 0643 03/28/22 0026 03/29/22 0034  NA 136 137 141  K 2.7* 2.8* 2.6*  CL 100 106 107  CO2 20* 20* 21*  GLUCOSE 108* 119* 152*  BUN 8 11 10   CREATININE 1.30* 1.55* 1.27*  CALCIUM 7.7* 7.6* 7.9*   LFT Recent Labs    03/27/22 0643 03/28/22 0026 03/29/22 0034  PROT 5.9*   < > 6.1*  ALBUMIN <1.5*   < > 2.0*  AST 84*   < > 56*  ALT 31   < > 24  ALKPHOS 316*   < > 243*  BILITOT 25.3*   < > 26.8*  BILIDIR 15.5*  --   --    < > = values in this interval not displayed.   PT/INR Recent Labs    04/12/2022 1552 03/29/22 0034  LABPROT 31.0* 39.9*  INR 3.0* 4.2*    Studies/Results: DG CHEST PORT 1 VIEW  Result Date: 03/29/2022 CLINICAL DATA:  36 year old female with hypoxemia EXAM:  PORTABLE CHEST 1 VIEW COMPARISON:  03/28/2022 FINDINGS: Cardiomediastinal silhouette likely unchanged, with the heart borders partially obscured by overlying lung/pleural disease. Low lung volumes persist with asymmetric elevation the right hemidiaphragm. Bilateral mixed interstitial and airspace disease persists, relatively unchanged from the prior. No pneumothorax.  No large pleural effusion IMPRESSION: Similar appearance of bilateral interstitial and airspace disease, with persisting cardiomegaly Electronically Signed   By: 14/01/2022 D.O.   On: 03/29/2022 08:10   DG CHEST PORT 1 VIEW  Result Date: 03/28/2022 CLINICAL DATA:  Hypoxemia. EXAM: PORTABLE CHEST 1 VIEW COMPARISON:  12/07/2021 FINDINGS: Low volume film. The cardio pericardial silhouette is enlarged. Patchy bilateral airspace disease evident. No substantial pleural effusion. The visualized bony structures of the thorax are unremarkable. Telemetry leads overlie the chest. IMPRESSION: Low volume film with patchy bilateral airspace disease. Imaging features suggest multifocal pneumonia. Electronically Signed   By: 12/09/2021 M.D.   On: 03/28/2022 13:21    MELD 3.0: 40 at 03/29/2022 12:34 AM MELD-Na: 37 at 03/29/2022 12:34 AM Calculated from: Serum Creatinine: 1.27 mg/dL at 14/02/2022 74/25/9563 AM Serum Sodium: 141 mmol/L (Using max of 137 mmol/L) at 03/29/2022 12:34 AM Total Bilirubin: 26.8 mg/dL at 14/02/2022 43/32/9518 AM Serum Albumin:  2.0 g/dL at 03/29/2022 12:34 AM INR(ratio): 4.2 at 03/29/2022 12:34 AM Age at listing (hypothetical): 51 years Sex: Female at 03/29/2022 12:34 AM     Assessment / Plan:    36 year old female with multiple medical problems, history of alcohol abuse, admitted with jaundice /liver failure with ascites, coagulopathy and profound hyperbilirubinemia.  AST greater than ALT elevation with leukocytosis,, no evidence of SBP on paracentesis.  She admitted to alcohol use during this admission, family was unaware of  this. She has evidence of portal hypertension on imaging. She has been started on prednisolone yesterday for suspected alcoholic hepatitis with liver failure.  She is not a transplant candidate.  She has had some hypoxia and difficulty breathing recently, ARDS suspected.  INR trending up.  Bilirubin slightly down today.  She has had a workup with imaging and lab work thus far.  She is critically ill, high risk for mortality during this hospitalization.  I spoke with the patient's family at length today about severity of her illness and prognosis.  She has been seen by palliative care.  They want to give more time with supportive care.  She was given some vitamin K this morning, rifaximin has been added to lactulose for encephalopathy, and continuing prednisolone 40 mg daily.  Will continue to trend labs daily.  She is tolerating p.o. and her medications at this point in time, if this stops her encephalopathy worsens may need intubation to protect her airway.  We can check a few additional labs such as HCVRNA, other autoimmune labs, ceruloplasmin etc. although I think would be unlikely to be cause other than alcohol.  If she can tolerate an MRI of her liver at some point can consider that pending her course to rule out [facility] given lesion noted on CT scan.  PLAN: - continue prednisolone for now - continue lactulose / rifaximin - continue vitamin K 10mg  / daily and trend INR - encourage PO intake if she tolerates it. If mental status worsens will need to make NPO and consider intubation - can add a few additional labs to ensure no other cause ongoing - HCV RNA, IgG, ANA, SMA, ceruloplasmin, await other infectious workup that is pending - MRI liver pending her course, if she can tolerate it - antibiotics / management of respiratory status per primary service, ARDS?  Extremely poor prognosis, high risk for mortality, appreciate palliative care involvement.  Jolly Mango, MD [facility]  Gastroenterology

## 2022-03-29 NOTE — Progress Notes (Signed)
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  She is also noticed that she is becoming more jaundiced in the last week.  She reports occasional subjective fever and chills at home.  Significant events: 12/8-admit to the hospital  Significant imaging / results / micro data: 12/8-CT abdomen pelvis with mild peripancreatic fat stranding, portal hypertension, splenomegaly, large volume abdominal ascites and new low-attenuation lesion of the right lobe of the liver measuring 8 mm 12/9-right upper quadrant Doppler ultrasound with hepatic vasculature appearing patent, and hepatic parenchyma reflecting acute hepatitis  Subjective / 24h Interval events: She is more confused today, wakes up and denies complaints but falls back asleep quickly  Assesement and Plan: Active Problems:   LFT elevation   Chronic diastolic heart failure (HCC)   Chronic lower back pain   Sinus tachycardia   Splenic vein thrombosis   Alcohol induced acute pancreatitis without necrosis or infection   Chronic pancreatitis (HCC)   Gastroesophageal reflux disease   Asthma, severe persistent, well-controlled   Depression   History of cardiac arrest   Seizure disorder (HCC)   Tracheostomy status (HCC)   Anoxic brain injury ([place])   Jaundice   Hyperbilirubinemia   Decompensated hepatic cirrhosis (HCC)   Alcoholic cirrhosis of liver with ascites (HCC)   Encephalopathy,  hepatic ([place])  Principal problem Acute liver injury due to ongoing EtOH use-looks more cholestatic in nature, AST and ALT are minimally elevated but alkaline phosphatase is 369 and total bilirubin was 26 on admission.  She is visibly jaundiced.  There is also evidence of coagulopathy and leukocytosis.  Initially she denied any EtOH use for the past several years but later on admitted to the GI doctor that she has been drinking. -Gastroenterology consulted, appreciate input -Liver Dopplers without any significant thrombosis.  Liver parenchyma appears acute hepatitis likely due to alcohol.   -Was started on prednisolone yesterday by GI.  She has a very poor prognosis even if she recovers from this acute episode  Active problems SIRS-she is tachycardic, tachypneic, has a significantly elevated WBC.  She was placed on empiric antibiotics, continue.  There was concern of some maroon stools last night, keep on ceftriaxone  Acute hypoxic respiratory failure due to presumed early ARDS-she has been having worsening respiratory status yesterday, up to nonrebreather this morning.  Chest x-ray with multifocal interstitial and airspace disease, cardiomegaly, suspect developing ARDS.  Acute kidney injury-she has been intermittently hypotensive.  Midodrine was added, received albumin and Lasix, creatinine improving this morning.  Urine sodium does not suggest hepatorenal  Hepatic encephalopathy-continues to have asterixis and confusion, slightly worse.  Continue lactulose, added rifaximin yesterday  Acute on chronic pancreatitis -CT scan on admission showed mild peripancreatic stranding, lipase is elevated.  No longer on IV fluids due to fluid overload  Seizure disorder-continue home medications  Essential hypertension-she is on low-dose metoprolol  GERD - Holding PPI while we evaluate etiology of liver injury/disease   Anxiety, depression - Holding home BuSpar, Zoloft, trazodone for now while  investigating etiology of acute liver injury   QT prolongation - History of this but QTc currently normal on EKG in ED.   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   Asthma - Continue home Pulmicort and albuterol  Chronic pain - Continue home oxycodone   History of cardiac arrest August 2023, mild anoxic brain injury, ataxia - History of cardiac arrest in July with evidence of anoxic brain injury on imaging at that time as well some ataxia, history of trach at that time as well, now decannulated.   Goals of care-Long discussion on couple occasions with the patient's family, including the husband, mother, sister.  She is hospitalized with liver failure likely in the setting of ongoing EtOH use, and has an extremely high MELD score.  She is critically ill and there is a good chance that she may not make it through this hospitalization.  They understand that she is not a transplant candidate due to ongoing EtOH use.  Palliative care also consulted.  I recommend that patient be DNR/DNI as in the setting of a cardiac arrest ROSC would not resolve the liver problems, and being on a vent is unlikely to bring any benefit either.  They will talk about it   Scheduled Meds:  budesonide  0.25 mg Nebulization BID   gabapentin  900 mg Oral BID   lacosamide  100 mg Oral BID   lactulose  30 g Oral TID   lipase/protease/amylase  24,000 Units Oral TID WC   magnesium oxide  400 mg Oral Daily   metoprolol tartrate  12.5 mg Oral BID   midodrine  5 mg Oral TID WC   potassium chloride  40 mEq Oral Q3H   prednisoLONE  40 mg Oral Daily   rifaximin  550 mg Oral TID   sodium chloride flush  3 mL Intravenous Q12H   thiamine  100 mg Oral Daily   Continuous Infusions:  cefTRIAXone (ROCEPHIN)  IV 2 g (03/28/22 2156)   potassium chloride 10 mEq (03/29/22 1125)   PRN  Meds:.albuterol, HYDROmorphone (DILAUDID) injection, hydrOXYzine, lidocaine, metoCLOPramide (REGLAN) injection, mouth rinse, oxyCODONE, polyethylene glycol, polyvinyl alcohol  Current Outpatient Medications  Medication Instructions   albuterol (VENTOLIN HFA) 108 (90 Base) MCG/ACT inhaler 2 puffs, Inhalation, Every 6 hours PRN   atorvastatin (LIPITOR) 10 MG tablet TAKE 1 TABLET(10 MG) BY MOUTH AT BEDTIME   blood glucose meter kit and supplies KIT Dispense based on patient and insurance preference. Use up to four times daily as directed.   budesonide (PULMICORT) 0.25 mg, Nebulization, 2 times daily   busPIRone (BUSPAR) 5-10 mg, Oral, 3 times daily PRN, For anxiety- take 1-2 tabs as needed-   cetirizine (ZYRTEC) 10 MG tablet TAKE 1 TABLET(10 MG) BY MOUTH DAILY   diphenhydrAMINE (BENADRYL) 25 mg, Oral, Every 6 hours PRN   EPINEPHrine (EPIPEN 2-PAK) 0.3 mg, Intramuscular, Once PRN   folic acid (FOLVITE) 1 MG tablet TAKE 1 TABLET(1 MG) BY MOUTH DAILY   gabapentin (NEURONTIN) 300 MG capsule TAKE 3 CAPSULES(900 MG) BY MOUTH TWICE DAILY   Lacosamide 100 mg, Oral, 2 times daily   lidocaine (LIDODERM) 5 % 1 patch, Transdermal, Every 24 hours, Remove & Discard patch within 12 hours or as directed by MD   magnesium oxide (MAG-OX) 400 (240 Mg) MG tablet TAKE 2 TABLETS(800 MG) BY MOUTH TWICE DAILY  melatonin 3 mg, Oral, Daily at bedtime   metFORMIN (GLUCOPHAGE) 500 mg, Oral, 2 times daily with meals   metoprolol tartrate (LOPRESSOR) 25 MG tablet TAKE 1 TABLET(25 MG) BY MOUTH TWICE DAILY   montelukast (SINGULAIR) 10 MG tablet TAKE ONE TABLET BY MOUTH AT BEDTIME   naloxone (NARCAN) nasal spray 4 mg/0.1 mL SMARTSIG:Both Nares   omeprazole (PRILOSEC) 40 MG capsule TAKE 1 CAPSULE(40 MG) BY MOUTH DAILY   oxyCODONE (ROXICODONE) 7.5 mg, Oral, Every 8 hours PRN, This is an increase from  5 mg to 7.5 mg three times a day as needed. Ninety pills were filled on 10/21/21 prior to your admission on 07/19. Now that you  are taking less you should have almost a month supply.   Pancrelipase, Lip-Prot-Amyl, (ZENPEP) 20000-63000 units CPEP 2 capsules, Oral, 3 times daily   polycarbophil (FIBERCON) 625 mg, Oral, 2 times daily   polyvinyl alcohol (LIQUIFILM TEARS) 1.4 % ophthalmic solution 1 drop, Both Eyes, As needed   Probiotic Product (DIGESTIVE ADVANTAGE) CAPS 1 capsule, Oral, Daily   saccharomyces boulardii (FLORASTOR) 250 mg, Oral, 2 times daily   sertraline (ZOLOFT) 100 mg, Oral, Daily   thiamine (VITAMIN B-1) 100 mg, Oral, Daily   traZODone (DESYREL) 75 mg, Oral, Daily at bedtime    Diet Orders (From admission, onward)     Start     Ordered   04/06/2022 1932  Diet clear liquid Room service appropriate? Yes; Fluid consistency: Thin  Diet effective now       Question Answer Comment  Room service appropriate? Yes   Fluid consistency: Thin      04/03/2022 1931            DVT prophylaxis: Place and maintain sequential compression device Start: 03/27/22 1532 SCDs Start: 04/17/2022 1855   Lab Results  Component Value Date   PLT 141 (L) 03/29/2022      Code Status: Full Code  Family Communication: Husband, mother, sister at bedside  Status is: Inpatient  Remains inpatient appropriate because: Severity of illness  Level of care: Progressive  Consultants:  Gastroenterology  Objective: Vitals:   03/29/22 0609 03/29/22 0700 03/29/22 0744 03/29/22 0903  BP:  95/71 95/71   Pulse:   (!) 108   Resp:  12 17   Temp:   99.4 F (37.4 C)   TempSrc:   Axillary   SpO2: 94%  90% 91%  Weight:      Height:        Intake/Output Summary (Last 24 hours) at 03/29/2022 1152 Last data filed at 03/29/2022 0000 Gross per 24 hour  Intake 340 ml  Output 475 ml  Net -135 ml    Wt Readings from Last 3 Encounters:  03/29/22 95.7 kg  01/26/22 88.5 kg  01/18/22 87.8 kg    Examination:  Constitutional: NAD Eyes: HENT icterus ENMT: mmm Neck: normal, supple Respiratory: clear to auscultation  bilaterally, no wheezing, no crackles. Normal respiratory effort.  Cardiovascular: Regular rate and rhythm, no murmurs / rubs / gallops. No LE edema. Abdomen: soft, no distention, no tenderness. Bowel sounds positive.  Skin: no rashes, jaundiced skin Neurologic: no focal deficits, asterixis present   Data Reviewed: I have independently reviewed following labs and imaging studies   CBC Recent Labs  Lab 03/20/2022 1441 03/27/22 0643 03/28/22 0026 03/29/22 0034  WBC 18.6* 24.2* 21.5* 21.6*  HGB 12.1 11.9* 10.7* 10.4*  HCT 34.9* 33.9* 31.3* 30.6*  PLT 171 166 136* 141*  MCV 92.6 91.9 94.8 95.3  MCH 32.1 32.2 32.4 32.4  MCHC 34.7 35.1 34.2 34.0  RDW 21.3* 21.5* 21.4* 21.2*  LYMPHSABS 1.4  --   --  0.4*  MONOABS 1.9*  --   --  0.9  EOSABS 0.0  --   --  0.0  BASOSABS 0.0  --   --  0.0     Recent Labs  Lab 03/25/2022 1441 04/11/2022 1552 03/27/22 0643 03/28/22 0026 03/29/22 0034  NA 135  --  136 137 141  K 3.5  --  2.7* 2.8* 2.6*  CL 101  --  100 106 107  CO2 21*  --  20* 20* 21*  GLUCOSE 119*  --  108* 119* 152*  BUN 8  --  _0 CREATININE 0.96  --  1.30* 1.55* 1.27*  CALCIUM 8.0*  --  7.7* 7.6* 7.9*  AST 115*  --  84* 65* 56*  ALT 39  --  _1 ALKPHOS 369*  --  316* 274* 243*  BILITOT 26.1*  --  25.3* 28.3* 26.8*  ALBUMIN 1.6*  --  <1.5* 1.9* 2.0*  MG  --   --  2.1 2.1  --   LATICACIDVEN 1.3  --   --   --   --   INR  --  3.0*  --   --  4.2*  AMMONIA  --  67*  --   --   --      ------------------------------------------------------------------------------------------------------------------ No results for input(s): "CHOL", "HDL", "LDLCALC", "TRIG", "CHOLHDL", "LDLDIRECT" in the last 72 hours.  Lab Results  Component Value Date   HGBA1C 4.5 (L) 11/27/2021   ------------------------------------------------------------------------------------------------------------------ No results for input(s): "TSH", "T4TOTAL", "T3FREE", "THYROIDAB" in the last 72  hours.  Invalid input(s): "FREET3"  Cardiac Enzymes No results for input(s): "CKMB", "TROPONINI", "MYOGLOBIN" in the last 168 hours.  Invalid input(s): "CK" ------------------------------------------------------------------------------------------------------------------    Component Value Date/Time   BNP 252.7 (H) 11/13/2021 1930    CBG: No results for input(s): "GLUCAP" in the last 168 hours.  Recent Results (from the past 240 hour(s))  Blood culture (routine x 2)     Status: None (Preliminary result)   Collection Time: 04/16/2022  2:41 PM   Specimen: BLOOD  Result Value Ref Range Status   Specimen Description BLOOD SITE NOT SPECIFIED  Final   Special Requests   Final    BOTTLES DRAWN AEROBIC AND ANAEROBIC Blood Culture results may not be optimal due to an inadequate volume of blood received in culture bottles   Culture   Final    NO GROWTH 3 DAYS Performed at [facility], [place] [address] [place], [place] [address]    Report Status PENDING  Incomplete  MRSA Next Gen by PCR, Nasal     Status: None   Collection Time: 03/20/2022 11:54 PM   Specimen: Nasal Mucosa; Nasal Swab  Result Value Ref Range Status   MRSA by PCR Next Gen NOT DETECTED NOT DETECTED Final    Comment: (NOTE) The GeneXpert MRSA Assay (FDA approved for NASAL specimens only), is one component of a comprehensive MRSA colonization surveillance program. It is not intended to diagnose MRSA infection nor to guide or monitor treatment for MRSA infections. Test performance is not FDA approved in patients less than 59 years old. Performed at [facility], [place] [address] [place], [place] [address]   C Difficile Quick Screen w PCR reflex     Status: None   Collection Time: 03/27/22  7:23 AM   Specimen: STOOL  Result Value Ref Range Status   C Diff antigen NEGATIVE NEGATIVE Final   C Diff toxin NEGATIVE NEGATIVE Final   C Diff interpretation No C. difficile detected.  Final    Comment: Performed at  [facility], [place] [address], [place], [place] [address]  Gastrointestinal Panel by PCR , Stool     Status: None   Collection Time: 03/27/22  7:23 AM   Specimen: STOOL  Result Value Ref Range Status   Campylobacter species NOT DETECTED NOT DETECTED Final   Plesimonas shigelloides NOT DETECTED NOT DETECTED Final   Salmonella species NOT DETECTED NOT DETECTED Final   Yersinia enterocolitica NOT DETECTED NOT DETECTED Final   Vibrio species NOT DETECTED NOT DETECTED Final   Vibrio cholerae NOT DETECTED NOT DETECTED Final   Enteroaggregative E coli (EAEC) NOT DETECTED NOT DETECTED Final   Enteropathogenic E coli (EPEC) NOT DETECTED NOT DETECTED Final   Enterotoxigenic E coli (ETEC) NOT DETECTED NOT DETECTED Final   Shiga like toxin producing E coli (STEC) NOT DETECTED NOT DETECTED Final   Shigella/Enteroinvasive E coli (EIEC) NOT DETECTED NOT DETECTED Final   Cryptosporidium NOT DETECTED NOT DETECTED Final   Cyclospora cayetanensis NOT DETECTED NOT DETECTED Final   Entamoeba histolytica NOT DETECTED NOT DETECTED Final   Giardia lamblia NOT DETECTED NOT DETECTED Final   Adenovirus F40/41 NOT DETECTED NOT DETECTED Final   Astrovirus NOT DETECTED NOT DETECTED Final   Norovirus GI/GII NOT DETECTED NOT DETECTED Final   Rotavirus A NOT DETECTED NOT DETECTED Final   Sapovirus (I, II, IV, and V) NOT DETECTED NOT DETECTED Final    Comment: Performed at [facility], [place] [place], [place] [address]  Culture, body fluid w Gram Stain-bottle     Status: None (Preliminary result)   Collection Time: 03/27/22  9:28 AM   Specimen: Peritoneal Washings  Result Value Ref Range Status   Specimen Description PERITONEAL  Final   Special Requests NONE  Final   Culture   Final    NO GROWTH 2 DAYS Performed at [facility], [address] [place], [place] [address]    Report Status PENDING  Incomplete  Gram stain     Status: None   Collection Time: 03/27/22  9:28 AM    Specimen: Peritoneal Washings  Result Value Ref Range Status   Specimen Description PERITONEAL  Final   Special Requests NONE  Final   Gram Stain   Final    NO ORGANISMS SEEN NO WBC SEEN Performed at [facility], [place] [address] [place], [place] [address]    Report Status 03/27/2022 FINAL  Final  SARS Coronavirus 2 by RT PCR (hospital order, performed in [facility] hospital lab) *cepheid single result test* Anterior Nasal Swab     Status: None   Collection Time: 03/28/22 10:57 AM   Specimen: Anterior Nasal Swab  Result Value Ref Range Status   SARS Coronavirus 2 by RT PCR NEGATIVE NEGATIVE Final    Comment: (NOTE) SARS-CoV-2 target nucleic acids are NOT DETECTED.  The SARS-CoV-2 RNA is generally detectable in upper and lower respiratory specimens during the acute phase of infection. The lowest concentration of SARS-CoV-2 viral copies this assay can detect is 250 copies / mL. A negative result does not preclude SARS-CoV-2 infection and should not be used as the sole basis for treatment or other patient management decisions.  A negative result may occur with improper specimen  collection / handling, submission of specimen other than nasopharyngeal swab, presence of viral mutation(s) within the areas targeted by this assay, and inadequate number of viral copies (<250 copies / mL). A negative result must be combined with clinical observations, patient history, and epidemiological information.  Fact Sheet for Patients:   https://www.patel.info/  Fact Sheet for Healthcare Providers: https://hall.com/  This test is not yet approved or  cleared by the [place] FDA and has been authorized for detection and/or diagnosis of SARS-CoV-2 by FDA under an Emergency Use Authorization (EUA).  This EUA will remain in effect (meaning this test can be used) for the duration of the COVID-19 declaration under Section 564(b)(1) of the Act, 21  U.S.C. section 360bbb-3(b)(1), unless the authorization is terminated or revoked sooner.  Performed at [facility], [place] [address] [place], [place] [address]   Respiratory (~20 pathogens) panel by PCR     Status: None   Collection Time: 03/28/22 10:57 AM   Specimen: Nasopharyngeal Swab; Respiratory  Result Value Ref Range Status   Adenovirus NOT DETECTED NOT DETECTED Final   Coronavirus 229E NOT DETECTED NOT DETECTED Final    Comment: (NOTE) The Coronavirus on the Respiratory Panel, DOES NOT test for the novel  Coronavirus (2019 nCoV)    Coronavirus HKU1 NOT DETECTED NOT DETECTED Final   Coronavirus NL63 NOT DETECTED NOT DETECTED Final   Coronavirus OC43 NOT DETECTED NOT DETECTED Final   Metapneumovirus NOT DETECTED NOT DETECTED Final   Rhinovirus / Enterovirus NOT DETECTED NOT DETECTED Final   Influenza A NOT DETECTED NOT DETECTED Final   Influenza B NOT DETECTED NOT DETECTED Final   Parainfluenza Virus 1 NOT DETECTED NOT DETECTED Final   Parainfluenza Virus 2 NOT DETECTED NOT DETECTED Final   Parainfluenza Virus 3 NOT DETECTED NOT DETECTED Final   Parainfluenza Virus 4 NOT DETECTED NOT DETECTED Final   Respiratory Syncytial Virus NOT DETECTED NOT DETECTED Final   Bordetella pertussis NOT DETECTED NOT DETECTED Final   Bordetella Parapertussis NOT DETECTED NOT DETECTED Final   Chlamydophila pneumoniae NOT DETECTED NOT DETECTED Final   Mycoplasma pneumoniae NOT DETECTED NOT DETECTED Final    Comment: Performed at [facility], [place]. [address] [place], [place] [address]     Radiology Studies: DG CHEST PORT 1 VIEW  Result Date: 03/29/2022 CLINICAL DATA:  36 year old female with hypoxemia EXAM: PORTABLE CHEST 1 VIEW COMPARISON:  03/28/2022 FINDINGS: Cardiomediastinal silhouette likely unchanged, with the heart borders partially obscured by overlying lung/pleural disease. Low lung volumes persist with asymmetric elevation the right hemidiaphragm. Bilateral mixed  interstitial and airspace disease persists, relatively unchanged from the prior. No pneumothorax.  No large pleural effusion IMPRESSION: Similar appearance of bilateral interstitial and airspace disease, with persisting cardiomegaly Electronically Signed   By: Corrie Mckusick D.O.   On: 03/29/2022 08:10   DG CHEST PORT 1 VIEW  Result Date: 03/28/2022 CLINICAL DATA:  Hypoxemia. EXAM: PORTABLE CHEST 1 VIEW COMPARISON:  12/07/2021 FINDINGS: Low volume film. The cardio pericardial silhouette is enlarged. Patchy bilateral airspace disease evident. No substantial pleural effusion. The visualized bony structures of the thorax are unremarkable. Telemetry leads overlie the chest. IMPRESSION: Low volume film with patchy bilateral airspace disease. Imaging features suggest multifocal pneumonia. Electronically Signed   By: Misty Stanley M.D.   On: 03/28/2022 13:21     Marzetta Board, MD, PhD [facility]  Between 7 am - 7 pm I am available, please contact me via Amion (for emergencies) or Securechat (non urgent  messages)  Between 7 pm - 7 am I am not available, please contact night coverage MD/APP via Amion

## 2022-03-29 NOTE — Plan of Care (Signed)
  Problem: Coping: Goal: Level of anxiety will decrease Outcome: Progressing   Problem: Pain Managment: Goal: General experience of comfort will improve Outcome: Progressing   Problem: Safety: Goal: Ability to remain free from injury will improve Outcome: Progressing   

## 2022-03-29 NOTE — Progress Notes (Signed)
Palliative Medicine Progress Note   Patient Name: Kara [name]       Date: 03/29/2022 DOB: 09-15-1985  Age: 36 y.o. MRN#: [id number] Attending Physician: Caren Griffins, MD Primary Care Physician: Lawerance Cruel, MD Admit Date: 04/16/2022    HPI/Patient Profile: 36 y.o. 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 She presented to [facility] ED on 04/10/2022 with abdominal pain, nausea, vomiting, and diarrhea. She was admitted to [facility] service with jaundice, LFT elevation, and acute liver disease. Palliative Medicine was consulted for goals of care.   Subjective: Chart reviewed and patient assessed at bedside. She is able to tell me her name and that she is in the hospital, but otherwise is confused. Currently on 13L oxygen.   Family Meeting: I met with husband, sister, and mother in the [facility] consult room. They verbalize understanding that she is seriously ill with advanced liver failure. They understand she may not survive hospitalization. We discussed that she is not a candidate for transplant at this time due to her acute condition as well as would need to abstain from alcohol for 6 months.   The difference between full scope medical intervention and comfort care was considered.  Encouraged family to consider at what point they would want to stop full scope medical interventions, keeping in mind the concept of quality of life. Family shares that Kara [name] has had "a tough time" lately and has been "sick for a long time". They do not feel she has  good quality of life these past few months since the cardiac arrest.   At this time family is cautiously hopeful for improvement. They are clear that they would like to continue current full scope medical interventions with watchful waiting. They will make decisions as needed pending Kara [name]'s clinical course.   We did discuss code status. Family all agrees that DNR/DNI status would be appropriate, but have concerns about placing the purple bracelet and feel this would cause distress to Kara [name]. They do not want her to receive aggressive interventions such as CPR, intubation, or defibrillation in the event of cardiopulmonary arrest. They also feel that DNR/DNI would  likely be consistent with Kara [name]'s wishes. However, they do not want DNR order placed until they feel comfortable with the bracelet.  Plan for further discussion tomorrow.    Objective:  Physical Exam Vitals reviewed.  Constitutional:      General: She is awake. She is not in acute distress.    Appearance: She is ill-appearing.  Pulmonary:     Effort: Pulmonary effort is normal.  Skin:    Coloration: Skin is jaundiced.  Neurological:     Mental Status: She is confused.            Vital Signs: BP 97/68 (BP Location: Left Arm)   Pulse (!) 108   Temp 99.4 F (37.4 C) (Axillary)   Resp (!) 21   Ht _0  (1.651 m)   Wt 95.7 kg   LMP  (LMP Unknown)   SpO2 92%   BMI 35.11 kg/m  SpO2: SpO2: 92 % O2 Device: O2 Device: High Flow Nasal Cannula O2 Flow Rate: O2 Flow Rate (L/min): 13 L/min      Palliative Medicine Assessment & Plan   Assessment: Active Problems:   LFT elevation   Chronic diastolic heart failure (HCC)   Chronic lower back pain   Sinus tachycardia   Splenic vein thrombosis   Alcohol induced acute pancreatitis without necrosis or infection   Chronic pancreatitis (HCC)   Gastroesophageal reflux disease   Asthma, severe persistent, well-controlled   Depression   History of cardiac arrest   Seizure  disorder (HCC)   Tracheostomy status (HCC)   Anoxic brain injury (Kara [name])   Jaundice   Hyperbilirubinemia   Decompensated hepatic cirrhosis (HCC)   Alcoholic cirrhosis of liver with ascites (HCC)   Encephalopathy, hepatic (HCC)   Alcoholic hepatitis with ascites    Recommendations/Plan: Family agrees with DNR/DNI status however they have concerns about placing purple bracelet - they have concerns this will distress Kara [name] and wish to discuss further tomorrow Continue full scope care for now Family is cautiously hopeful for improvement but understand the severity of patient's illness PMT will follow up tomorrow  Prognosis:  High risk for mortality   Care plan was discussed with Dr. Cruzita Lederer (via secure chat)   Thank you for allowing the Palliative Medicine Team to assist in the care of this patient.   Greater than 50%  of this time was spent counseling and coordinating care related to the above assessment and plan.  Total time: 48 minutes   Lavena Bullion, NP Palliative Medicine   Please contact Palliative Medicine Team phone at [phone] for questions and concerns.  For individual provider, see AMION.

## 2022-03-29 NOTE — Plan of Care (Signed)
  Problem: Education: Goal: Knowledge of General Education information will improve Description: Including pain rating scale, medication(s)/side effects and non-pharmacologic comfort measures Outcome: Progressing   Problem: Health Behavior/Discharge Planning: Goal: Ability to manage health-related needs will improve Outcome: Progressing   Problem: Clinical Measurements: Goal: Ability to maintain clinical measurements within normal limits will improve Outcome: Progressing Goal: Will remain free from infection Outcome: Progressing   Problem: Activity: Goal: Risk for activity intolerance will decrease Outcome: Progressing   Problem: Nutrition: Goal: Adequate nutrition will be maintained Outcome: Progressing   Problem: Coping: Goal: Level of anxiety will decrease Outcome: Progressing   Problem: Skin Integrity: Goal: Risk for impaired skin integrity will decrease Outcome: Progressing

## 2022-03-30 DIAGNOSIS — K7682 Hepatic encephalopathy: Secondary | ICD-10-CM

## 2022-03-30 DIAGNOSIS — Z8674 Personal history of sudden cardiac arrest: Secondary | ICD-10-CM | POA: Diagnosis not present

## 2022-03-30 DIAGNOSIS — K7011 Alcoholic hepatitis with ascites: Secondary | ICD-10-CM

## 2022-03-30 DIAGNOSIS — R17 Unspecified jaundice: Secondary | ICD-10-CM | POA: Diagnosis not present

## 2022-03-30 DIAGNOSIS — K7031 Alcoholic cirrhosis of liver with ascites: Secondary | ICD-10-CM | POA: Diagnosis not present

## 2022-03-30 LAB — HCV RNA QUANT: HCV Quantitative: NOT DETECTED IU/mL (ref >50)

## 2022-03-30 LAB — CBC
HCT: 30.2 % — ABNORMAL LOW (ref 36.0–46.0)
Hemoglobin: 10.6 g/dL — ABNORMAL LOW (ref 12.0–15.0)
MCH: 32.7 pg (ref 26.0–34.0)
MCHC: 35.1 g/dL (ref 30.0–36.0)
MCV: 93.2 fL (ref 80.0–100.0)
Platelets: 170 10*3/uL (ref 150–400)
RBC: 3.24 MIL/uL — ABNORMAL LOW (ref 3.87–5.11)
RDW: 20.8 % — ABNORMAL HIGH (ref 11.5–15.5)
WBC: 23.3 10*3/uL — ABNORMAL HIGH (ref 4.0–10.5)
nRBC: 0 % (ref 0.0–0.2)

## 2022-03-30 LAB — COMPREHENSIVE METABOLIC PANEL
ALT: 23 U/L (ref 0–44)
AST: 60 U/L — ABNORMAL HIGH (ref 15–41)
Albumin: 2.1 g/dL — ABNORMAL LOW (ref 3.5–5.0)
Alkaline Phosphatase: 234 U/L — ABNORMAL HIGH (ref 38–126)
Anion gap: 9 (ref 5–15)
BUN: 14 mg/dL (ref 6–20)
CO2: 22 mmol/L (ref 22–32)
Calcium: 8.4 mg/dL — ABNORMAL LOW (ref 8.9–10.3)
Chloride: 115 mmol/L — ABNORMAL HIGH (ref 98–111)
Creatinine, Ser: 1.01 mg/dL — ABNORMAL HIGH (ref 0.44–1.00)
GFR, Estimated: >60 mL/min (ref >60)
Glucose, Bld: 170 mg/dL — ABNORMAL HIGH (ref 70–99)
Potassium: 3.3 mmol/L — ABNORMAL LOW (ref 3.5–5.1)
Sodium: 146 mmol/L — ABNORMAL HIGH (ref 135–145)
Total Bilirubin: 28.1 mg/dL (ref 0.3–1.2)
Total Protein: 5.9 g/dL — ABNORMAL LOW (ref 6.5–8.1)

## 2022-03-30 LAB — MAGNESIUM: Magnesium: 2.2 mg/dL (ref 1.7–2.4)

## 2022-03-30 LAB — CERULOPLASMIN: Ceruloplasmin: 15.9 mg/dL — ABNORMAL LOW (ref 19.0–39.0)

## 2022-03-30 LAB — PATHOLOGIST SMEAR REVIEW

## 2022-03-30 LAB — IGG: IgG (Immunoglobin G), Serum: 960 mg/dL (ref 586–1602)

## 2022-03-30 LAB — PROTIME-INR
INR: 4.1 (ref 0.8–1.2)
Prothrombin Time: 39.5 seconds — ABNORMAL HIGH (ref 11.4–15.2)

## 2022-03-30 LAB — ANTI-SMOOTH MUSCLE ANTIBODY, IGG: F-Actin IgG: 5 Units (ref 0–19)

## 2022-03-30 LAB — MITOCHONDRIAL ANTIBODIES: Mitochondrial M2 Ab, IgG: <20 Units (ref 0.0–20.0)

## 2022-03-30 LAB — EPSTEIN-BARR VIRUS VCA, IGM: EBV VCA IgM: <36 U/mL (ref 0.0–35.9)

## 2022-03-30 LAB — FIBRINOGEN: Fibrinogen: 264 mg/dL (ref 210–475)

## 2022-03-30 LAB — ANA W/REFLEX IF POSITIVE: Anti Nuclear Antibody (ANA): NEGATIVE

## 2022-03-30 MED ORDER — VANCOMYCIN HCL 1500 MG/300ML IV SOLN
1500.0000 mg | INTRAVENOUS | Status: DC
  Filled 2022-03-30: qty 300

## 2022-03-30 MED ORDER — OXYCODONE HCL 5 MG PO TABS
2.5000 mg | ORAL_TABLET | Freq: Once | ORAL | Status: AC
  Administered 2022-03-30: 2.5 mg via ORAL
  Filled 2022-03-30: qty 1

## 2022-03-30 MED ORDER — VITAMIN K1 10 MG/ML IJ SOLN
10.0000 mg | Freq: Once | INTRAVENOUS | Status: AC
  Administered 2022-03-30: 10 mg via INTRAVENOUS
  Filled 2022-03-30: qty 1

## 2022-03-30 MED ORDER — PANTOPRAZOLE SODIUM 40 MG IV SOLR
40.0000 mg | Freq: Two times a day (BID) | INTRAVENOUS | Status: DC
  Administered 2022-03-30 – 2022-03-31 (×3): 40 mg via INTRAVENOUS
  Filled 2022-03-30 (×3): qty 10

## 2022-03-30 MED ORDER — POTASSIUM CHLORIDE CRYS ER 20 MEQ PO TBCR
40.0000 meq | EXTENDED_RELEASE_TABLET | ORAL | Status: AC
  Administered 2022-03-30 (×2): 40 meq via ORAL
  Filled 2022-03-30 (×2): qty 2

## 2022-03-30 MED ORDER — MIDODRINE HCL 5 MG PO TABS
10.0000 mg | ORAL_TABLET | Freq: Three times a day (TID) | ORAL | Status: DC
  Administered 2022-03-30 – 2022-03-31 (×4): 10 mg via ORAL
  Filled 2022-03-30 (×4): qty 2

## 2022-03-30 MED ORDER — VANCOMYCIN HCL 2000 MG/400ML IV SOLN
2000.0000 mg | INTRAVENOUS | Status: AC
  Administered 2022-03-30: 2000 mg via INTRAVENOUS
  Filled 2022-03-30: qty 400

## 2022-03-30 MED ORDER — ALBUMIN HUMAN 25 % IV SOLN
25.0000 g | Freq: Once | INTRAVENOUS | Status: AC
  Administered 2022-03-30: 25 g via INTRAVENOUS
  Filled 2022-03-30: qty 100

## 2022-03-30 NOTE — Progress Notes (Signed)
Progress Note   Subjective  Mental status better today - she is responding appropriate to some questioning.  Has had some rectal bleeding - diarrhea from lactulose with dark maroon blood tinge. Hgb stable, BUN normal.    Objective   Vital signs in last 24 hours: Temp:  [98.3 F (36.8 C)-99.3 F (37.4 C)] 99.3 F (37.4 C) (12/12 1106) Pulse Rate:  [92-110] 110 (12/12 1300) Resp:  [23-43] 32 (12/12 1300) BP: (91-99)/(50-67) 98/52 (12/12 1300) SpO2:  [91 %-98 %] 94 % (12/12 1300) Last BM Date : 03/29/22 General:    white female, jaundiced Abdomen:  Soft, nontender nondistended.  Psych:  Cooperative. Normal mood and affect. More alert today, answering questions.  Intake/Output from previous day: 12/11 0701 - 12/12 0700 In: 486.6 [IV Piggyback:486.6] Out: 1550 [Urine:1550] Intake/Output this shift: Total I/O In: 60 [P.O.:60] Out: 200 [Urine:200]  Lab Results: Recent Labs    03/28/22 0026 03/29/22 0034 03/30/22 0024  WBC 21.5* 21.6* 23.3*  HGB 10.7* 10.4* 10.6*  HCT 31.3* 30.6* 30.2*  PLT 136* 141* 170   BMET Recent Labs    03/28/22 0026 03/29/22 0034 03/29/22 1518 03/30/22 0024  NA 137 141  --  146*  K 2.8* 2.6* 3.0* 3.3*  CL 106 107  --  115*  CO2 20* 21*  --  22  GLUCOSE 119* 152*  --  170*  BUN 11 10  --  14  CREATININE 1.55* 1.27*  --  1.01*  CALCIUM 7.6* 7.9*  --  8.4*   LFT Recent Labs    03/30/22 0024  PROT 5.9*  ALBUMIN 2.1*  AST 60*  ALT 23  ALKPHOS 234*  BILITOT 28.1*   PT/INR Recent Labs    03/29/22 0034 03/30/22 0024  LABPROT 39.9* 39.5*  INR 4.2* 4.1*    Studies/Results: DG CHEST PORT 1 VIEW  Result Date: 03/29/2022 CLINICAL DATA:  36 year old female with hypoxemia EXAM: PORTABLE CHEST 1 VIEW COMPARISON:  03/28/2022 FINDINGS: Cardiomediastinal silhouette likely unchanged, with the heart borders partially obscured by overlying lung/pleural disease. Low lung volumes persist with asymmetric elevation the right  hemidiaphragm. Bilateral mixed interstitial and airspace disease persists, relatively unchanged from the prior. No pneumothorax.  No large pleural effusion IMPRESSION: Similar appearance of bilateral interstitial and airspace disease, with persisting cardiomegaly Electronically Signed   By: Gilmer Mor D.O.   On: 03/29/2022 08:10       Assessment / Plan:    36 year old female with multiple medical problems, history of alcohol abuse, admitted with jaundice /liver failure with ascites, coagulopathy and profound hyperbilirubinemia, AST > ALT elevation with leukocytosis, no evidence of SBP on paracentesis.  She admitted to alcohol use during this admission.  Presumed diagnosis is liver failure from alcoholic hepatitis with underlying cirrhosis. Day #3 prednisolone 40mg  / day. She is not a transplant candidate.  She has had some hypoxia and difficulty breathing recently, ARDS suspected, on oxygen. Bilirubinemia about the same with INR stable. On lactulose and Rifaximin, encephalopathy is better today. Renal function normal / stable. Overnight some rectal bleeding but BUN and Hgb stable, do not think she is having an upper GI bleed. She is not a good candidate for colonoscopy, will monitor for now. Placed empirically on protonix especially while on steroids. If significant bleeding will need to consider CTA.   Spoke again with patient's family - prognosis remains poor, at risk for mortality during this admission and they understand this. Continue supportive care for now, will see  if prednisolone helps her. She is eating and mental status improving which is good.   Otherwise, labs starting to come back. No evidence of autoimmune hepatitis. Ceruloplasmin is slightly low although this is nonspecific. Can send 24 hour urine copper but will take several days to come back. Can see if optho can look in her eyes for evidence of Wilson's, again this would be rare but would change her management if she had this. Some  infectious labs remain pending.   Once she can lie flat and tolerate an MRI of her liver at some point can consider that pending her course to rule out [facility] given lesion noted on CT scan.   PLAN: - continue prednisolone for now - continue lactulose / rifaximin - continue vitamin K 10mg  / daily and trend INR - encourage PO intake - await pending serologies - 24 hour urine copper, eye exam per optho if they are available - continue PPI - MRI liver pending her course, if she can tolerate it - antibiotics / management of respiratory status per primary service - monitor rectal bleeding, she is quite coagulopathic, poor candidate for colonoscopy. If she has significant bleeding then CTA - appreciate palliative care involvement, family discussing DNR / DNI status  Jolly Mango, MD [facility]

## 2022-03-30 NOTE — Progress Notes (Signed)
Date and time results received: 03/30/22 0110   Test: INR Critical Value: 4.1  Name of Provider Notified: Dr. Imogene Burn  Orders Received? Or Actions Taken?:

## 2022-03-30 NOTE — Plan of Care (Signed)
  Problem: Pain Managment: Goal: General experience of comfort will improve Outcome: Progressing   Problem: Safety: Goal: Ability to remain free from injury will improve Outcome: Progressing   

## 2022-03-30 NOTE — Progress Notes (Signed)
Palliative Medicine Progress Note   Patient Name: Kara [name]       Date: 03/30/2022 DOB: 1985/12/11  Age: 36 y.o. MRN#: [id number] Attending Physician: Caren Griffins, MD Primary Care Physician: Lawerance Cruel, MD Admit Date: 03/19/2022   HPI/Patient Profile: 36 y.o. 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 She presented to [facility] ED on 04/02/2022 with abdominal pain, nausea, vomiting, and diarrhea. She was admitted to [facility] service with jaundice, LFT elevation, and acute liver disease. Palliative Medicine was consulted for goals of care in the setting of severe liver failure.     Subjective: Chart reviewed. Kidney function improved (creatinine 1.01). Total bilirubin remains elevated (28.1). She had several large episodes of diarrhea overnight as well as rectal bleeding noted. Per GI, she is not a good candidate for colonoscopy.   Bedside visit. Patient is currently sleeping and recently received dilaudid. Per family (and confirmed by progress notes), mental status improved today. She remains on oxygen at 13 L.   Discussed that even though Kara [name]'s mental status is improved, it is unclear whether she has insight into her condition or ability to make complex medical decisions. With regard to code status, family agrees DNR/DNI is appropriate. However, they do not want purple DNR bracelet placed as they feel this will cause distress to Kara [name]. Discussed option to cover DNR letters with tape - family agrees.   Family expresses having a  "sliver of hope" and they are cautiously optimistic. However, they understand risk for mortality remains high. Offered ongoing palliative support.     Objective:  Physical Exam Vitals reviewed.  Constitutional:      General: She is sleeping. She is not in acute distress.    Appearance: She is ill-appearing.  Cardiovascular:     Rate and Rhythm: Tachycardia present.  Pulmonary:     Effort: Pulmonary effort is normal.  Skin:    Coloration: Skin is jaundiced.             Vital Signs: BP (!) 99/57 (BP Location: Left Arm)   Pulse (!) 107   Temp 99.3 F (37.4 C) (Axillary)  Resp (!) 31   Ht 5\' 5"  (1.651 m)   Wt 95.7 kg   LMP  (LMP Unknown)   SpO2 95%   BMI 35.11 kg/m  SpO2: SpO2: 95 % O2 Device: O2 Device: High Flow Nasal Cannula   Palliative Medicine Assessment & Plan   Assessment: Active Problems:   LFT elevation   Chronic diastolic heart failure (HCC)   Chronic lower back pain   Sinus tachycardia   Splenic vein thrombosis   Alcohol induced acute pancreatitis without necrosis or infection   Chronic pancreatitis (HCC)   Gastroesophageal reflux disease   Asthma, severe persistent, well-controlled   Depression   History of cardiac arrest   Seizure disorder (HCC)   Tracheostomy status (HCC)   Anoxic brain injury (HCC)   Jaundice   Hyperbilirubinemia   Decompensated hepatic cirrhosis (HCC)   Alcoholic cirrhosis of liver with ascites (HCC)   Encephalopathy, hepatic (HCC)   Alcoholic hepatitis with ascites    Recommendations/Plan: Code status changed to DNR/DNI Place purple bracelet with "DNR" covered with tape - I have discussed with PMT Medical Director Dr. Continue supportive care for now - family is cautiously optimistic but understand risk of mortality os high This NP will follow-up when I return to service on 12/14, call [phone] for urgent needs  Prognosis:  Very guarded    Thank you for allowing the Palliative Medicine Team to  assist in the care of this patient.   MDM - High   [phone], NP   Please contact Palliative Medicine Team phone at [phone] for questions and concerns.  For individual providers, please see AMION.

## 2022-03-30 NOTE — Progress Notes (Signed)
Pharmacy Antibiotic Note  Kara [name] is a 36 y.o. female admitted on 03/29/2022 with  fever, possible PNA .  Pharmacy has been consulted for Vancomycin dosing. Pt also on ceftriaxone (Day #5) for intra-abd process.  Plan: Rocephin 2gm IV q24h (per MD) Vancomycin 2000mg  IV now then  1500 mg IV Q 24 hrs. Goal AUC 400-550. Expected AUC: 510, SCr used: 1.01, Vd coeff 0.5 Will f/u renal function, micro data, and pt's clinical condition Vanc levels prn   Height: 5\' 5"  (165.1 cm) Weight: 95.7 kg (210 lb 15.7 oz) IBW/kg (Calculated) : 57  Temp (24hrs), Avg:99 F (37.2 C), Min:98.3 F (36.8 C), Max:100.5 F (38.1 C)  Recent Labs  Lab 03/21/2022 1441 03/27/22 0643 03/28/22 0026 03/29/22 0034 03/30/22 0024  WBC 18.6* 24.2* 21.5* 21.6* 23.3*  CREATININE 0.96 1.30* 1.55* 1.27* 1.01*  LATICACIDVEN 1.3  --   --   --   --     Estimated Creatinine Clearance: 87.3 mL/min (A) (by C-G formula based on SCr of 1.01 mg/dL (H)).    Allergies  Allergen Reactions   Maxzide [Hydrochlorothiazide W-Triamterene] Nausea Only and Other (See Comments)    Dizziness    Nsaids Diarrhea and Nausea And Vomiting   Other Hives, Itching and Other (See Comments)    TREE NUT - itching, hives   Peanut Butter Flavor Hives and Itching   Peanuts [Peanut Oil] Hives and Itching   Tylenol [Acetaminophen] Diarrhea, Nausea And Vomiting and Other (See Comments)    Elevated LFT    Wellbutrin [Bupropion] Other (See Comments)    Lowered seizure threshold    Antimicrobials this admission: Vancomycin 12/12>> Ceftriaxone 12/8>> Metronidazole 12/8 >> 12/10 Doxycycline 12/10 x1  Microbiology results: Resp PCR 12/10: neg  Peritoneal cx 12/9: no orgs C diff 12/9: neg GI panel 12/9: neg  Bcx 12/8: ngtd   Thank you for allowing pharmacy to be a part of this patient's care.  14/9 03/30/2022 4:46 PM

## 2022-03-30 NOTE — Consult Note (Signed)
Reason for consult:  rule out Kayser-Fleischer rings  HPI: Kara [name] is an 36 y.o. female.  She is is in liver failure.  GI called for consult to rule out Kayser-Fleisher ring. Patient is in bed.  Not answering questions. Per her family she has not had any visual change prior to illness.  Past Ocular History:  Per family:  LASIK in [place].  She has been treated for Dry Eye Syndrome at [facility]. Past Medical History:  Diagnosis Date   Alcoholism in remission (HCC)    per pt in remission since 2017   Anemia    AR (allergic rhinitis)    allergist-- dr Lucie Leather   Biliary dyskinesia    CAP (community acquired pneumonia) 06/13/2016   Cardiac arrest (HCC) 11/13/2021   Diastolic CHF (HCC)    followd by dr Burna Forts   Environmental allergies    FUO (fever of unknown origin)    Generalized tonic-clonic seizure (HCC) 01/20/2021   GERD (gastroesophageal reflux disease)    HCAP (healthcare-associated pneumonia) 07/31/2016   Headache    History of acute pancreatitis    alcoholic pancreatitis ,  2016 and 2017   History of sepsis 06/12/2016   with CAP   History of syncope    History of thrombosis 01/29/2015   splenic vein thrombosis-- treated with coumadin--- readmitted for abd. hematoma due to coumadin treated with IR embolization of the IMA branches   Hypertension    Hypokalemia 04/08/2014   Hypomagnesemia 12/15/2021   Hyponatremia 08/27/2016   Hypothermia 07/16/2014   Polyneuropathy    Prolonged QT syndrome    cardiologist-- dr Eden Emms (notes in epic)   Respiratory failure with hypoxia (HCC) 07/31/2016   Sepsis (HCC) 06/12/2016   Severe persistent asthma    pulmonologist-- dr dr Belva Crome (notes in epic)   Past Surgical History:  Procedure Laterality Date   CHOLECYSTECTOMY N/A 06/21/2018   Procedure: LAPAROSCOPIC CHOLECYSTECTOMY;  Surgeon: Abigail Miyamoto, MD;  Location: [facility];  Service: General;  Laterality: N/A;   ESOPHAGOGASTRODUODENOSCOPY N/A 07/17/2014   Procedure:  ESOPHAGOGASTRODUODENOSCOPY (EGD);  Surgeon: Dorena Cookey, MD;  Location: [facility];  Service: Endoscopy;  Laterality: N/A;   TYMPANOSTOMY TUBE PLACEMENT Bilateral 11 MONTHS OLD   VIDEO BRONCHOSCOPY Bilateral 10/08/2016   Procedure: VIDEO BRONCHOSCOPY WITH FLUORO;  Surgeon: Chilton Greathouse, MD;  Location: [facility];  Service: Cardiopulmonary;  Laterality: Bilateral;   Family History  Problem Relation Age of Onset   Heart failure Mother    Colon polyps Mother    Heart failure Father    Diabetes Father    Heart failure Maternal Grandfather    Colon cancer Neg Hx    Current Facility-Administered Medications  Medication Dose Route Frequency Provider Last Rate Last Admin   albuterol (PROVENTIL) (2.5 MG/3ML) 0.083% nebulizer solution 2.5 mg  2.5 mg Nebulization Q6H PRN Synetta Fail, MD       budesonide (PULMICORT) nebulizer solution 0.25 mg  0.25 mg Nebulization BID Synetta Fail, MD   0.25 mg at 03/30/22 0739   cefTRIAXone (ROCEPHIN) 2 g in sodium chloride 0.9 % 100 mL IVPB  2 g Intravenous Daily Synetta Fail, MD 200 mL/hr at 03/29/22 2141 2 g at 03/29/22 2141   gabapentin (NEURONTIN) capsule 900 mg  900 mg Oral BID Synetta Fail, MD   900 mg at 03/30/22 1014   HYDROmorphone (DILAUDID) injection 1 mg  1 mg Intravenous Q4H PRN Synetta Fail, MD   1 mg at 03/30/22 1854  hydrOXYzine (ATARAX) tablet 10 mg  10 mg Oral Q6H PRN Marcelyn Bruins, MD   10 mg at 03/28/22 1214   lacosamide (VIMPAT) tablet 100 mg  100 mg Oral BID Marcelyn Bruins, MD   100 mg at 03/30/22 1015   lactulose (CHRONULAC) 10 GM/15ML solution 30 g  30 g Oral TID Caren Griffins, MD   30 g at 03/30/22 1729   lidocaine (LIDODERM) 5 % 1 patch  1 patch Transdermal Daily PRN Marcelyn Bruins, MD       lipase/protease/amylase (CREON) capsule 24,000 Units  24,000 Units Oral TID WC Bell, Lorin C, RPH   24,000 Units at 03/29/22 1708   magnesium oxide (MAG-OX) tablet 400 mg  400 mg Oral Daily  Marcelyn Bruins, MD   400 mg at 03/30/22 1015   metoCLOPramide (REGLAN) injection 5 mg  5 mg Intravenous Q6H PRN Marcelyn Bruins, MD       midodrine (PROAMATINE) tablet 10 mg  10 mg Oral TID WC Caren Griffins, MD   10 mg at 03/30/22 1728   Oral care mouth rinse  15 mL Mouth Rinse PRN Caren Griffins, MD       oxyCODONE (Oxy IR/ROXICODONE) immediate release tablet 7.5 mg  7.5 mg Oral Q8H PRN Marcelyn Bruins, MD   7.5 mg at 03/28/22 1052   pantoprazole (PROTONIX) injection 40 mg  40 mg Intravenous Q12H Caren Griffins, MD   40 mg at 03/30/22 1021   polyethylene glycol (MIRALAX / GLYCOLAX) packet 17 g  17 g Oral Daily PRN Marcelyn Bruins, MD       polyvinyl alcohol (LIQUIFILM TEARS) 1.4 % ophthalmic solution 1 drop  1 drop Both Eyes Daily PRN Marcelyn Bruins, MD       prednisoLONE tablet 40 mg  40 mg Oral Daily Zehr, Jessica D, PA-C   40 mg at 03/30/22 1015   rifaximin (XIFAXAN) tablet 550 mg  550 mg Oral TID Caren Griffins, MD   550 mg at 03/30/22 1728   sodium chloride flush (NS) 0.9 % injection 3 mL  3 mL Intravenous Q12H Marcelyn Bruins, MD   3 mL at 03/30/22 1350   thiamine (VITAMIN B1) tablet 100 mg  100 mg Oral Daily Marcelyn Bruins, MD   100 mg at 03/30/22 1015   [START ON 03/31/2022] vancomycin (VANCOREADY) IVPB 1500 mg/300 mL  1,500 mg Intravenous Q24H Franky Macho, RPH       vancomycin (VANCOREADY) IVPB 2000 mg/400 mL  2,000 mg Intravenous NOW Franky Macho, RPH 200 mL/hr at 03/30/22 1810 2,000 mg at 03/30/22 1810   Allergies  Allergen Reactions   Maxzide [Hydrochlorothiazide W-Triamterene] Nausea Only and Other (See Comments)    Dizziness    Nsaids Diarrhea and Nausea And Vomiting   Other Hives, Itching and Other (See Comments)    TREE NUT - itching, hives   Peanut Butter Flavor Hives and Itching   Peanuts [Peanut Oil] Hives and Itching   Tylenol [Acetaminophen] Diarrhea, Nausea And Vomiting and Other (See Comments)    Elevated LFT     Wellbutrin [Bupropion] Other (See Comments)    Lowered seizure threshold   Social History   Socioeconomic History   Marital status: Married    Spouse name: Winferd Humphrey   Number of children: 0   Years of education: Not on file   Highest education level: Not on file  Occupational History   Occupation: Investment banker, corporate  Tobacco  Use   Smoking status: Never   Smokeless tobacco: Never  Vaping Use   Vaping Use: Unknown  Substance and Sexual Activity   Alcohol use: Not Currently    Comment: pt hx alcohol abuse , in remission since 2017   Drug use: Not Currently    Comment: "POT WHEN I WAS IN HIGH SCHOOL"   Sexual activity: Yes    Birth control/protection: None  Other Topics Concern   Not on file  Social History Narrative   ** Merged History Encounter **       Married.   Social Determinants of Health   Financial Resource Strain: Not on file  Food Insecurity: Not on file  Transportation Needs: Not on file  Physical Activity: Not on file  Stress: Not on file  Social Connections: Not on file  Intimate Partner Violence: Not on file    Review of systems:  Not able.  Patient not answering questions  Physical Exam:  Blood pressure (!) 90/59, pulse (!) 103, temperature (!) 100.5 F (38.1 C), temperature source Axillary, resp. rate 19, height 5\' 5"  (1.651 m), weight 95.7 kg, SpO2 93 %.   VA cc (OTC rdrs): unable.  Patient not responding to commands or questions  Pupils:   OD round, reactive to light, no APD            OS round, reactive to light, no APD  IOP :  soft to palpation OU  CVF: unable.  Not responding to commands  Motility:  grossly full during exam, but not following commands  Balance/alignment:  Unable.  Will not fixate on light  Bedside examination:                                 OD                                       External/adnexa: Jaundice of skin                                      Lids/lashes:        Normal                                       Conjunctiva       Icterus of Sclera        Cornea:              Clear.  No Kayser Fleisher ring                  AC:                     Deep, quiet                                Iris:                     Normal        Lens:                  Poor view through small pupil.  No obvious cataract                                    OS                                       External/adnexa: Jaundice                                    Lids/lashes:        Normal                                      Conjunctiva        Scleral Icterus       Cornea:              Clear.  No Kayser-Fleischer RIng                  AC:                     Deep, quiet                                Iris:                     Normal        Lens:                  Poor View through small Pupil.  No obvious cataract     Labs/studies: Results for orders placed or performed during the hospital encounter of 04/02/2022 (from the past 48 hour(s))  CBC with Differential/Platelet     Status: Abnormal   Collection Time: 03/29/22 12:34 AM  Result Value Ref Range   WBC 21.6 (H) 4.0 - 10.5 K/uL   RBC 3.21 (L) 3.87 - 5.11 MIL/uL   Hemoglobin 10.4 (L) 12.0 - 15.0 g/dL   HCT 30.6 (L) 36.0 - 46.0 %   MCV 95.3 80.0 - 100.0 fL   MCH 32.4 26.0 - 34.0 pg   MCHC 34.0 30.0 - 36.0 g/dL   RDW 21.2 (H) 11.5 - 15.5 %   Platelets 141 (L) 150 - 400 K/uL   nRBC 0.0 0.0 - 0.2 %   Neutrophils Relative % 94 %   Neutro Abs 20.3 (H) 1.7 - 7.7 K/uL   Lymphocytes Relative 2 %   Lymphs Abs 0.4 (L) 0.7 - 4.0 K/uL   Monocytes Relative 4 %   Monocytes Absolute 0.9 0.1 - 1.0 K/uL   Eosinophils Relative 0 %   Eosinophils Absolute 0.0 0.0 - 0.5 K/uL   Basophils Relative 0 %   Basophils Absolute 0.0 0.0 - 0.1 K/uL   nRBC 0 0 /100 WBC   Abs Immature Granulocytes 0.00 0.00 - 0.07 K/uL   Polychromasia PRESENT    Target Cells PRESENT     Comment: Performed at [facility], [address], [place], [place] [address]  Protime-INR     Status:  Abnormal   Collection Time: 03/29/22 12:34 AM  Result Value Ref Range   Prothrombin Time  39.9 (H) 11.4 - 15.2 seconds   INR 4.2 (HH) 0.8 - 1.2    Comment: REPEATED TO VERIFY CRITICAL RESULT CALLED TO, READ BACK BY AND VERIFIED WITH: S SETH,RN 03/29/2022 0234 WILDERK (NOTE) INR goal varies based on device and disease states. Performed at [facility], [place] [address] [place], [place] [address]   Comprehensive metabolic panel     Status: Abnormal   Collection Time: 03/29/22 12:34 AM  Result Value Ref Range   Sodium 141 135 - 145 mmol/L   Potassium 2.6 (LL) 3.5 - 5.1 mmol/L    Comment: CRITICAL RESULT CALLED TO, READ BACK BY AND VERIFIED WITH CRESHENDA MCKINNEY RN.@[id number]  ON 12.11.23 BY TCALDWELL MT.   Chloride 107 98 - 111 mmol/L   CO2 21 (L) 22 - 32 mmol/L   Glucose, Bld 152 (H) 70 - 99 mg/dL    Comment: Glucose reference range applies only to samples taken after fasting for at least 8 hours.   BUN 10 6 - 20 mg/dL   Creatinine, Ser 1.27 (H) 0.44 - 1.00 mg/dL    Comment: ICTERUS AT THIS LEVEL MAY AFFECT RESULT   Calcium 7.9 (L) 8.9 - 10.3 mg/dL   Total Protein 6.1 (L) 6.5 - 8.1 g/dL   Albumin 2.0 (L) 3.5 - 5.0 g/dL   AST 56 (H) 15 - 41 U/L   ALT 24 0 - 44 U/L   Alkaline Phosphatase 243 (H) 38 - 126 U/L   Total Bilirubin 26.8 (HH) 0.3 - 1.2 mg/dL    Comment: CRITICAL VALUE NOTED. VALUE IS CONSISTENT WITH PREVIOUSLY REPORTED/CALLED VALUE   GFR, Estimated 56 (L) >60 mL/min    Comment: (NOTE) Calculated using the CKD-EPI Creatinine Equation (2021)    Anion gap 13 5 - 15    Comment: Performed at [facility], [place] [address] [place], [place] [address]  IgG     Status: None   Collection Time: 03/29/22  3:18 PM  Result Value Ref Range   IgG (Immunoglobin G), Serum 960 586 - 1,602 mg/dL    Comment: (NOTE) Performed At: [facility] [place], [place] [id number] Rush Farmer MD [id number]   Anti-smooth muscle antibody, IgG     Status: None    Collection Time: 03/29/22  3:18 PM  Result Value Ref Range   F-Actin IgG 5 0 - 19 Units    Comment: (NOTE)                 Negative                     0 - 19                 Weak positive               20 - 30                 Moderate to strong positive     >30 Actin Antibodies are found in 52-85% of patients with autoimmune hepatitis or chronic active hepatitis and in 22% of patients with primary biliary cirrhosis. Performed At: [facility] [place], [place] [id number] Rush Farmer MD [id number]   ANA w/Reflex if Positive     Status: None   Collection Time: 03/29/22  3:18 PM  Result Value Ref Range   Anti Nuclear Antibody (ANA) Negative Negative    Comment: (NOTE) Performed At: [facility] [place], [place]  [id number] Jolene Schimke MD [id number]   Mitochondrial antibodies     Status: None   Collection Time: 03/29/22  3:18 PM  Result Value Ref Range   Mitochondrial M2 Ab, IgG <20.0 0.0 - 20.0 Units    Comment: (NOTE)                                Negative    0.0 - 20.0                                Equivocal  20.1 - 24.9                                Positive         >24.9 Mitochondrial (M2) Antibodies are found in 90-96% of patients with primary biliary cirrhosis. Performed At: [facility] [place] [facility] [address] [place], [place] [id number] Jolene Schimke MD [id number]   Ceruloplasmin     Status: Abnormal   Collection Time: 03/29/22  3:18 PM  Result Value Ref Range   Ceruloplasmin 15.9 (L) 19.0 - 39.0 mg/dL    Comment: (NOTE) Performed At: [facility] [address] [place], [place] [id number] Jolene Schimke MD [id number]   Potassium     Status: Abnormal   Collection Time: 03/29/22  3:18 PM  Result Value Ref Range   Potassium 3.0 (L) 3.5 - 5.1 mmol/L    Comment: Performed at [facility], [address] [place], [place] [address]  Comprehensive metabolic panel     Status: Abnormal    Collection Time: 03/30/22 12:24 AM  Result Value Ref Range   Sodium 146 (H) 135 - 145 mmol/L   Potassium 3.3 (L) 3.5 - 5.1 mmol/L   Chloride 115 (H) 98 - 111 mmol/L   CO2 22 22 - 32 mmol/L   Glucose, Bld 170 (H) 70 - 99 mg/dL    Comment: Glucose reference range applies only to samples taken after fasting for at least 8 hours.   BUN 14 6 - 20 mg/dL   Creatinine, Ser 0.10 (H) 0.44 - 1.00 mg/dL    Comment: ICTERUS AT THIS LEVEL MAY AFFECT RESULT   Calcium 8.4 (L) 8.9 - 10.3 mg/dL   Total Protein 5.9 (L) 6.5 - 8.1 g/dL   Albumin 2.1 (L) 3.5 - 5.0 g/dL   AST 60 (H) 15 - 41 U/L   ALT 23 0 - 44 U/L   Alkaline Phosphatase 234 (H) 38 - 126 U/L   Total Bilirubin 28.1 (HH) 0.3 - 1.2 mg/dL    Comment: CRITICAL VALUE NOTED. VALUE IS CONSISTENT WITH PREVIOUSLY REPORTED/CALLED VALUE   GFR, Estimated >60 >60 mL/min    Comment: (NOTE) Calculated using the CKD-EPI Creatinine Equation (2021)    Anion gap 9 5 - 15    Comment: Performed at [facility], [address] [place], [place] [address]  CBC     Status: Abnormal   Collection Time: 03/30/22 12:24 AM  Result Value Ref Range   WBC 23.3 (H) 4.0 - 10.5 K/uL   RBC 3.24 (L) 3.87 - 5.11 MIL/uL   Hemoglobin 10.6 (L) 12.0 - 15.0 g/dL   HCT 97.5 (L) 88.3 - 25.4 %   MCV 93.2 80.0 - 100.0 fL   MCH 32.7 26.0 - 34.0 pg  MCHC 35.1 30.0 - 36.0 g/dL   RDW 16.1 (H) 09.6 - 04.5 %   Platelets 170 150 - 400 K/uL   nRBC 0.0 0.0 - 0.2 %    Comment: Performed at [facility], [address] [place], [place] [address]  Magnesium     Status: None   Collection Time: 03/30/22 12:24 AM  Result Value Ref Range   Magnesium 2.2 1.7 - 2.4 mg/dL    Comment: Performed at [facility], [address] [place], [place] [address]  Protime-INR     Status: Abnormal   Collection Time: 03/30/22 12:24 AM  Result Value Ref Range   Prothrombin Time 39.5 (H) 11.4 - 15.2 seconds   INR 4.1 (HH) 0.8 - 1.2    Comment: REPEATED TO VERIFY CRITICAL RESULT CALLED  TO, READ BACK BY AND VERIFIED WITH: Lance Morin, RN at 0100 on 03/30/22 by H. Howard. (NOTE) INR goal varies based on device and disease states. Performed at [facility], [address] [place], [place] [address]   Fibrinogen     Status: None   Collection Time: 03/30/22 12:24 AM  Result Value Ref Range   Fibrinogen 264 210 - 475 mg/dL    Comment: (NOTE) Fibrinogen results may be underestimated in patients receiving thrombolytic therapy. Performed at [facility], [address] [place], [place] [address]    DG CHEST PORT 1 VIEW  Result Date: 03/29/2022 CLINICAL DATA:  36 year old female with hypoxemia EXAM: PORTABLE CHEST 1 VIEW COMPARISON:  03/28/2022 FINDINGS: Cardiomediastinal silhouette likely unchanged, with the heart borders partially obscured by overlying lung/pleural disease. Low lung volumes persist with asymmetric elevation the right hemidiaphragm. Bilateral mixed interstitial and airspace disease persists, relatively unchanged from the prior. No pneumothorax.  No large pleural effusion IMPRESSION: Similar appearance of bilateral interstitial and airspace disease, with persisting cardiomegaly Electronically Signed   By: Gilmer Mor D.O.   On: 03/29/2022 08:10                             Assessment and Plan: Scleral Icterus and jaundice consistent with hepatic disease.  No Kayser-Fleischer Rings of corneas.  Patient can follow-up with her usual ophthalmologist [facility]) as planned.   Ashleigh Arya L 03/30/2022, 7:47 PM  [facility] [phone]

## 2022-03-30 NOTE — Progress Notes (Signed)
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  She is also noticed that she is becoming more jaundiced in the last week.  She reports occasional subjective fever and chills at home.  Significant events: 12/8-admit to the hospital  Significant imaging / results / micro data: 12/8-CT abdomen pelvis with mild peripancreatic fat stranding, portal hypertension, splenomegaly, large volume abdominal ascites and new low-attenuation lesion of the right lobe of the liver measuring 8 mm 12/9-right upper quadrant Doppler ultrasound with hepatic vasculature appearing patent, and hepatic parenchyma reflecting acute hepatitis  Subjective / 24h Interval events: Significant diarrhea overnight, but much more awake this morning, alert and oriented x 4.  Husband, mother, sister at bedside  Assesement and Plan: Active Problems:   LFT elevation   Chronic diastolic heart failure (HCC)   Chronic lower back pain   Sinus tachycardia   Splenic vein thrombosis   Alcohol induced acute pancreatitis without necrosis or infection   Chronic pancreatitis (HCC)   Gastroesophageal reflux disease   Asthma, severe persistent, well-controlled   Depression   History of cardiac arrest   Seizure disorder (HCC)   Tracheostomy status (HCC)   Anoxic brain injury ([place])   Jaundice   Hyperbilirubinemia   Decompensated hepatic cirrhosis (HCC)   Alcoholic cirrhosis of liver with  ascites (HCC)   Encephalopathy, hepatic (HCC)   Alcoholic hepatitis with ascites  Principal problem Acute liver injury due to ongoing EtOH use-looks more cholestatic in nature, AST and ALT are minimally elevated but alkaline phosphatase is 369 and total bilirubin was 26 on admission.  She is visibly jaundiced.  There is also evidence of coagulopathy and leukocytosis.  Initially she denied any EtOH use for the past several years but later on admitted to the GI doctor that she has been drinking. -Gastroenterology consulted, appreciate input -Liver Dopplers without any significant thrombosis.  Liver parenchyma appears acute hepatitis likely due to alcohol.   -Was started on prednisolone on 12/10 by GI.  She has a very poor prognosis even if she recovers from this acute episode  Active problems SIRS-she is tachycardic, tachypneic, has a significantly elevated WBC.  She was placed on empiric antibiotics, continue.  There is some concern about GI bleed, continue ceftriaxone  Acute hypoxic respiratory failure due to presumed early ARDS-she has been having worsening respiratory status over the past couple of days, up to nonrebreather on Monday.  Chest x-ray with multifocal interstitial and airspace disease, cardiomegaly, suspect developing ARDS.  Has been diuresed over the last few days, now slightly hypernatremic,  Acute kidney  hold Lasix todayinjury-she has been intermittently hypotensive.  Midodrine was added, received albumin and Lasix, creatinine now recovering.  Increase midodrine this morning due to soft blood pressures.  Give albumin again.  Hold Lasix  Hepatic encephalopathy-continue aggressive lactulose as well as rifaximin.  Encephalopathy seems to be much better this morning with her significant diarrhea overnight  Coagulopathy-due to liver disease, vitamin K today  Thrombocytopenia-due to liver disease, platelets improving  Hypokalemia-continue to replete  Hypernatremia-due to  Lasix  GI bleed-patient with maroon stools the other day, more red today.  Hemoglobin is stable.  Discussed with gastroenterology.  Repeat vitamin K today  Acute on chronic pancreatitis -CT scan on admission showed mild peripancreatic stranding, lipase is elevated.  No longer on IV fluids due to fluid overload  Seizure disorder-continue home medications  Essential hypertension-she is on low-dose metoprolol, hold today due to hypotension  GERD - Holding PPI while we evaluate etiology of liver injury/disease   Anxiety, depression - Holding home BuSpar, Zoloft, trazodone for now in the setting of acute liver injury   QT prolongation - History of this but QTc currently normal on EKG in ED.   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   Asthma - Continue home Pulmicort and albuterol  Chronic pain - Continue home oxycodone   History of cardiac arrest August 2023, mild anoxic brain injury, ataxia - History of cardiac arrest in July with evidence of anoxic brain injury on imaging at that time as well some ataxia, history of trach at that time as well, now decannulated.   Goals of care-Long discussion on couple occasions with the patient's family, including the husband, mother, sister.  She is hospitalized with liver failure likely in the setting of ongoing EtOH use, and has an extremely high MELD score.  She is critically ill and there is a good chance that she may not make it through this hospitalization.  They understand that she is not a transplant candidate due to ongoing EtOH use.  Palliative care also consulted.  I recommend that patient be DNR/DNI as in the setting of a cardiac arrest ROSC would not resolve the liver problems, and being on a vent is unlikely to bring any benefit either.  They will talk about it but still undecided today   Scheduled Meds:   budesonide  0.25 mg Nebulization BID   gabapentin  900 mg Oral BID   lacosamide  100 mg Oral BID   lactulose  30 g Oral TID   lipase/protease/amylase  24,000 Units Oral TID WC   magnesium oxide  400 mg Oral Daily   midodrine  10 mg Oral TID WC   pantoprazole (PROTONIX) IV  40 mg Intravenous Q12H   potassium chloride  40 mEq Oral Q3H   prednisoLONE  40 mg Oral Daily   rifaximin  550 mg Oral TID   sodium chloride flush  3 mL Intravenous Q12H   thiamine  100 mg Oral Daily   Continuous Infusions:  albumin human     cefTRIAXone (ROCEPHIN)  IV 2 g (03/29/22 2141)   phytonadione (VITAMIN K) 10 mg in dextrose 5 % 50 mL IVPB 10 mg (03/30/22 0830)   PRN Meds:.albuterol, HYDROmorphone (DILAUDID) injection, hydrOXYzine, lidocaine, metoCLOPramide (REGLAN) injection, mouth rinse, oxyCODONE, polyethylene glycol, polyvinyl alcohol  Current Outpatient Medications  Medication Instructions   albuterol (VENTOLIN HFA) 108 (90 Base) MCG/ACT inhaler 2 puffs, Inhalation, Every 6 hours PRN   atorvastatin (LIPITOR) 10 MG tablet TAKE 1 TABLET(10 MG) BY MOUTH AT BEDTIME   blood glucose meter kit and supplies KIT Dispense based on patient and insurance preference. Use up to four times daily as directed.   budesonide (PULMICORT) 0.25 mg, Nebulization, 2 times daily   busPIRone (BUSPAR) 5-10 mg, Oral, 3 times daily PRN, For anxiety- take 1-2 tabs as needed-  cetirizine (ZYRTEC) 10 MG tablet TAKE 1 TABLET(10 MG) BY MOUTH DAILY   diphenhydrAMINE (BENADRYL) 25 mg, Oral, Every 6 hours PRN   EPINEPHrine (EPIPEN 2-PAK) 0.3 mg, Intramuscular, Once PRN   folic acid (FOLVITE) 1 MG tablet TAKE 1 TABLET(1 MG) BY MOUTH DAILY   gabapentin (NEURONTIN) 300 MG capsule TAKE 3 CAPSULES(900 MG) BY MOUTH TWICE DAILY   Lacosamide 100 mg, Oral, 2 times daily   lidocaine (LIDODERM) 5 % 1 patch, Transdermal, Every 24 hours, Remove & Discard patch within 12 hours or as directed by MD   magnesium oxide (MAG-OX) 400 (240 Mg) MG tablet  TAKE 2 TABLETS(800 MG) BY MOUTH TWICE DAILY   melatonin 3 mg, Oral, Daily at bedtime   metFORMIN (GLUCOPHAGE) 500 mg, Oral, 2 times daily with meals   metoprolol tartrate (LOPRESSOR) 25 MG tablet TAKE 1 TABLET(25 MG) BY MOUTH TWICE DAILY   montelukast (SINGULAIR) 10 MG tablet TAKE ONE TABLET BY MOUTH AT BEDTIME   naloxone (NARCAN) nasal spray 4 mg/0.1 mL SMARTSIG:Both Nares   omeprazole (PRILOSEC) 40 MG capsule TAKE 1 CAPSULE(40 MG) BY MOUTH DAILY   oxyCODONE (ROXICODONE) 7.5 mg, Oral, Every 8 hours PRN, This is an increase from  5 mg to 7.5 mg three times a day as needed. Ninety pills were filled on 10/21/21 prior to your admission on 07/19. Now that you are taking less you should have almost a month supply.   Pancrelipase, Lip-Prot-Amyl, (ZENPEP) 20000-63000 units CPEP 2 capsules, Oral, 3 times daily   polycarbophil (FIBERCON) 625 mg, Oral, 2 times daily   polyvinyl alcohol (LIQUIFILM TEARS) 1.4 % ophthalmic solution 1 drop, Both Eyes, As needed   Probiotic Product (DIGESTIVE ADVANTAGE) CAPS 1 capsule, Oral, Daily   saccharomyces boulardii (FLORASTOR) 250 mg, Oral, 2 times daily   sertraline (ZOLOFT) 100 mg, Oral, Daily   thiamine (VITAMIN B-1) 100 mg, Oral, Daily   traZODone (DESYREL) 75 mg, Oral, Daily at bedtime    Diet Orders (From admission, onward)     Start     Ordered   04/07/2022 1932  Diet clear liquid Room service appropriate? Yes; Fluid consistency: Thin  Diet effective now       Question Answer Comment  Room service appropriate? Yes   Fluid consistency: Thin      03/31/2022 1931            DVT prophylaxis: Place and maintain sequential compression device Start: 03/27/22 1532 SCDs Start: 03/31/2022 1855   Lab Results  Component Value Date   PLT 170 03/30/2022      Code Status: Full Code  Family Communication: Husband, mother, sister at bedside  Status is: Inpatient  Remains inpatient appropriate because: Severity of illness  Level of care:  Progressive  Consultants:  Gastroenterology  Objective: Vitals:   03/30/22 0331 03/30/22 0526 03/30/22 0740 03/30/22 0800  BP: 94/67 (!) _0  Pulse: 100 (!) 102 (!) 102 (!) 105  Resp: (!) 40 (!) 36 (!) 42 (!) 43  Temp:  98.4 F (36.9 C)  98.3 F (36.8 C)  TempSrc:  Oral  Oral  SpO2: 91% 96% 98% 97%  Weight:      Height:        Intake/Output Summary (Last 24 hours) at 03/30/2022 0919 Last data filed at 03/30/2022 0200 Gross per 24 hour  Intake 486.6 ml  Output 1150 ml  Net -663.4 ml    Wt Readings from Last 3 Encounters:  03/29/22 95.7 kg  01/26/22 88.5 kg  01/18/22 87.8 kg    Examination:  Constitutional: NAD Eyes: Icterus present ENMT: mmm Neck: normal, supple Respiratory: clear to auscultation bilaterally, no wheezing, no crackles. Normal respiratory effort.  Cardiovascular: Regular rate and rhythm, no murmurs / rubs / gallops. No LE edema. Abdomen: soft, no distention, no tenderness. Bowel sounds positive.  Skin: no rashes, visibly jaundiced Neurologic: no focal deficits, equal strength   Data Reviewed: I have independently reviewed following labs and imaging studies   CBC Recent Labs  Lab 04/08/2022 1441 03/27/22 0643 03/28/22 0026 03/29/22 0034 03/30/22 0024  WBC 18.6* 24.2* 21.5* 21.6* 23.3*  HGB 12.1 11.9* 10.7* 10.4* 10.6*  HCT 34.9* 33.9* 31.3* 30.6* 30.2*  PLT 171 166 136* 141* 170  MCV 92.6 91.9 94.8 95.3 93.2  MCH 32.1 32.2 32.4 32.4 32.7  MCHC 34.7 35.1 34.2 34.0 35.1  RDW 21.3* 21.5* 21.4* 21.2* 20.8*  LYMPHSABS 1.4  --   --  0.4*  --   MONOABS 1.9*  --   --  0.9  --   EOSABS 0.0  --   --  0.0  --   BASOSABS 0.0  --   --  0.0  --      Recent Labs  Lab 03/22/2022 1441 03/27/2022 1552 03/27/22 0643 03/28/22 0026 03/29/22 0034 03/29/22 1518 03/30/22 0024  NA 135  --  136 137 141  --  146*  K 3.5  --  2.7* 2.8* 2.6* 3.0* 3.3*  CL 101  --  100 106 107  --  115*  CO2 21*  --  20* 20* 21*  --  22  GLUCOSE 119*  --   108* 119* 152*  --  170*  BUN 8  --  _0 --  14  CREATININE 0.96  --  1.30* 1.55* 1.27*  --  1.01*  CALCIUM 8.0*  --  7.7* 7.6* 7.9*  --  8.4*  AST 115*  --  84* 65* 56*  --  60*  ALT 39  --  _1 --  23  ALKPHOS 369*  --  316* 274* 243*  --  234*  BILITOT 26.1*  --  25.3* 28.3* 26.8*  --  28.1*  ALBUMIN 1.6*  --  <1.5* 1.9* 2.0*  --  2.1*  MG  --   --  2.1 2.1  --   --  2.2  LATICACIDVEN 1.3  --   --   --   --   --   --   INR  --  3.0*  --   --  4.2*  --  4.1*  AMMONIA  --  67*  --   --   --   --   --      ------------------------------------------------------------------------------------------------------------------ No results for input(s): "CHOL", "HDL", "LDLCALC", "TRIG", "CHOLHDL", "LDLDIRECT" in the last 72 hours.  Lab Results  Component Value Date   HGBA1C 4.5 (L) 11/27/2021   ------------------------------------------------------------------------------------------------------------------ No results for input(s): "TSH", "T4TOTAL", "T3FREE", "THYROIDAB" in the last 72 hours.  Invalid input(s): "FREET3"  Cardiac Enzymes No results for input(s): "CKMB", "TROPONINI", "MYOGLOBIN" in the last 168 hours.  Invalid input(s): "CK" ------------------------------------------------------------------------------------------------------------------    Component Value Date/Time   BNP 252.7 (H) 11/13/2021 1930    CBG: No results for input(s): "GLUCAP" in the last 168 hours.  Recent Results (from the past 240 hour(s))  Blood culture (routine x 2)     Status: None (Preliminary result)   Collection Time: 03/20/2022  2:41 PM  Specimen: BLOOD  Result Value Ref Range Status   Specimen Description BLOOD SITE NOT SPECIFIED  Final   Special Requests   Final    BOTTLES DRAWN AEROBIC AND ANAEROBIC Blood Culture results may not be optimal due to an inadequate volume of blood received in culture bottles   Culture   Final    NO GROWTH 3 DAYS Performed at [facility], [place] [address], [place], [place] [address]    Report Status PENDING  Incomplete  MRSA Next Gen by PCR, Nasal     Status: None   Collection Time: 03/23/2022 11:54 PM   Specimen: Nasal Mucosa; Nasal Swab  Result Value Ref Range Status   MRSA by PCR Next Gen NOT DETECTED NOT DETECTED Final    Comment: (NOTE) The GeneXpert MRSA Assay (FDA approved for NASAL specimens only), is one component of a comprehensive MRSA colonization surveillance program. It is not intended to diagnose MRSA infection nor to guide or monitor treatment for MRSA infections. Test performance is not FDA approved in patients less than 55 years old. Performed at [facility], [place] [address], [place], [place] [address]   C Difficile Quick Screen w PCR reflex     Status: None   Collection Time: 03/27/22  7:23 AM   Specimen: STOOL  Result Value Ref Range Status   C Diff antigen NEGATIVE NEGATIVE Final   C Diff toxin NEGATIVE NEGATIVE Final   C Diff interpretation No C. difficile detected.  Final    Comment: Performed at [facility], [place] [address] [place], [place] [address]  Gastrointestinal Panel by PCR , Stool     Status: None   Collection Time: 03/27/22  7:23 AM   Specimen: STOOL  Result Value Ref Range Status   Campylobacter species NOT DETECTED NOT DETECTED Final   Plesimonas shigelloides NOT DETECTED NOT DETECTED Final   Salmonella species NOT DETECTED NOT DETECTED Final   Yersinia enterocolitica NOT DETECTED NOT DETECTED Final   Vibrio species NOT DETECTED NOT DETECTED Final   Vibrio cholerae NOT DETECTED NOT DETECTED Final   Enteroaggregative E coli (EAEC) NOT DETECTED NOT DETECTED Final   Enteropathogenic E coli (EPEC) NOT DETECTED NOT DETECTED Final   Enterotoxigenic E coli (ETEC) NOT DETECTED NOT DETECTED Final   Shiga like toxin producing E coli (STEC) NOT DETECTED NOT DETECTED Final   Shigella/Enteroinvasive E coli (EIEC) NOT DETECTED NOT DETECTED Final   Cryptosporidium NOT DETECTED NOT  DETECTED Final   Cyclospora cayetanensis NOT DETECTED NOT DETECTED Final   Entamoeba histolytica NOT DETECTED NOT DETECTED Final   Giardia lamblia NOT DETECTED NOT DETECTED Final   Adenovirus F40/41 NOT DETECTED NOT DETECTED Final   Astrovirus NOT DETECTED NOT DETECTED Final   Norovirus GI/GII NOT DETECTED NOT DETECTED Final   Rotavirus A NOT DETECTED NOT DETECTED Final   Sapovirus (I, II, IV, and V) NOT DETECTED NOT DETECTED Final    Comment: Performed at [facility], [address] [place], [place] [address]  Culture, body fluid w Gram Stain-bottle     Status: None (Preliminary result)   Collection Time: 03/27/22  9:28 AM   Specimen: Peritoneal Washings  Result Value Ref Range Status   Specimen Description PERITONEAL  Final   Special Requests NONE  Final   Culture   Final    NO GROWTH 2 DAYS Performed at [facility], [address], [place], [place] [address]    Report Status PENDING  Incomplete  Gram stain     Status: None   Collection Time: 03/27/22  9:28 AM   Specimen: Peritoneal Washings  Result Value Ref Range Status   Specimen Description PERITONEAL  Final   Special Requests NONE  Final   Gram Stain   Final    NO ORGANISMS SEEN NO WBC SEEN Performed at [facility], [address], [place], [place] [address]    Report Status 03/27/2022 FINAL  Final  SARS Coronavirus 2 by RT PCR (hospital order, performed in [facility] hospital lab) *cepheid single result test* Anterior Nasal Swab     Status: None   Collection Time: 03/28/22 10:57 AM   Specimen: Anterior Nasal Swab  Result Value Ref Range Status   SARS Coronavirus 2 by RT PCR NEGATIVE NEGATIVE Final    Comment: (NOTE) SARS-CoV-2 target nucleic acids are NOT DETECTED.  The SARS-CoV-2 RNA is generally detectable in upper and lower respiratory specimens during the acute phase of infection. The lowest concentration of SARS-CoV-2 viral copies this assay can detect is 250 copies / mL. A negative  result does not preclude SARS-CoV-2 infection and should not be used as the sole basis for treatment or other patient management decisions.  A negative result may occur with improper specimen collection / handling, submission of specimen other than nasopharyngeal swab, presence of viral mutation(s) within the areas targeted by this assay, and inadequate number of viral copies (<250 copies / mL). A negative result must be combined with clinical observations, patient history, and epidemiological information.  Fact Sheet for Patients:   https://www.patel.info/  Fact Sheet for Healthcare Providers: https://hall.com/  This test is not yet approved or  cleared by the [place] FDA and has been authorized for detection and/or diagnosis of SARS-CoV-2 by FDA under an Emergency Use Authorization (EUA).  This EUA will remain in effect (meaning this test can be used) for the duration of the COVID-19 declaration under Section 564(b)(1) of the Act, 21 U.S.C. section 360bbb-3(b)(1), unless the authorization is terminated or revoked sooner.  Performed at [facility], [place] [address] [place], [place] [address]   Respiratory (~20 pathogens) panel by PCR     Status: None   Collection Time: 03/28/22 10:57 AM   Specimen: Nasopharyngeal Swab; Respiratory  Result Value Ref Range Status   Adenovirus NOT DETECTED NOT DETECTED Final   Coronavirus 229E NOT DETECTED NOT DETECTED Final    Comment: (NOTE) The Coronavirus on the Respiratory Panel, DOES NOT test for the novel  Coronavirus (2019 nCoV)    Coronavirus HKU1 NOT DETECTED NOT DETECTED Final   Coronavirus NL63 NOT DETECTED NOT DETECTED Final   Coronavirus OC43 NOT DETECTED NOT DETECTED Final   Metapneumovirus NOT DETECTED NOT DETECTED Final   Rhinovirus / Enterovirus NOT DETECTED NOT DETECTED Final   Influenza A NOT DETECTED NOT DETECTED Final   Influenza B NOT DETECTED NOT DETECTED Final    Parainfluenza Virus 1 NOT DETECTED NOT DETECTED Final   Parainfluenza Virus 2 NOT DETECTED NOT DETECTED Final   Parainfluenza Virus 3 NOT DETECTED NOT DETECTED Final   Parainfluenza Virus 4 NOT DETECTED NOT DETECTED Final   Respiratory Syncytial Virus NOT DETECTED NOT DETECTED Final   Bordetella pertussis NOT DETECTED NOT DETECTED Final   Bordetella Parapertussis NOT DETECTED NOT DETECTED Final   Chlamydophila pneumoniae NOT DETECTED NOT DETECTED Final   Mycoplasma pneumoniae NOT DETECTED NOT DETECTED Final    Comment: Performed at [facility], [place] [place]  [address], [place], [place] [address]     Radiology Studies: No results found.   Marzetta Board, MD, PhD [facility]  Between 7 am - 7 pm I am available, please contact me via Amion (for emergencies) or Securechat (non urgent messages)  Between 7 pm - 7 am I am not available, please contact night coverage MD/APP via Amion

## 2022-03-31 ENCOUNTER — Inpatient Hospital Stay (HOSPITAL_COMMUNITY): Payer: BC Managed Care – PPO

## 2022-03-31 DIAGNOSIS — K625 Hemorrhage of anus and rectum: Secondary | ICD-10-CM | POA: Diagnosis not present

## 2022-03-31 DIAGNOSIS — R000 Tachycardia, unspecified: Secondary | ICD-10-CM | POA: Diagnosis not present

## 2022-03-31 DIAGNOSIS — D696 Thrombocytopenia, unspecified: Secondary | ICD-10-CM

## 2022-03-31 DIAGNOSIS — J9601 Acute respiratory failure with hypoxia: Secondary | ICD-10-CM

## 2022-03-31 DIAGNOSIS — K7460 Unspecified cirrhosis of liver: Secondary | ICD-10-CM | POA: Diagnosis not present

## 2022-03-31 DIAGNOSIS — R109 Unspecified abdominal pain: Secondary | ICD-10-CM | POA: Diagnosis not present

## 2022-03-31 DIAGNOSIS — R17 Unspecified jaundice: Secondary | ICD-10-CM | POA: Diagnosis not present

## 2022-03-31 DIAGNOSIS — R1011 Right upper quadrant pain: Secondary | ICD-10-CM

## 2022-03-31 DIAGNOSIS — K7011 Alcoholic hepatitis with ascites: Secondary | ICD-10-CM | POA: Diagnosis not present

## 2022-03-31 LAB — CBC
HCT: 29.5 % — ABNORMAL LOW (ref 36.0–46.0)
Hemoglobin: 9.8 g/dL — ABNORMAL LOW (ref 12.0–15.0)
MCH: 31.8 pg (ref 26.0–34.0)
MCHC: 33.2 g/dL (ref 30.0–36.0)
MCV: 95.8 fL (ref 80.0–100.0)
Platelets: 175 10*3/uL (ref 150–400)
RBC: 3.08 MIL/uL — ABNORMAL LOW (ref 3.87–5.11)
RDW: 20.6 % — ABNORMAL HIGH (ref 11.5–15.5)
WBC: 20.4 10*3/uL — ABNORMAL HIGH (ref 4.0–10.5)
nRBC: 0 % (ref 0.0–0.2)

## 2022-03-31 LAB — COMPREHENSIVE METABOLIC PANEL
ALT: 23 U/L (ref 0–44)
AST: 69 U/L — ABNORMAL HIGH (ref 15–41)
Albumin: 2 g/dL — ABNORMAL LOW (ref 3.5–5.0)
Alkaline Phosphatase: 217 U/L — ABNORMAL HIGH (ref 38–126)
Anion gap: 9 (ref 5–15)
BUN: 12 mg/dL (ref 6–20)
CO2: 19 mmol/L — ABNORMAL LOW (ref 22–32)
Calcium: 8.5 mg/dL — ABNORMAL LOW (ref 8.9–10.3)
Chloride: 120 mmol/L — ABNORMAL HIGH (ref 98–111)
Creatinine, Ser: 0.83 mg/dL (ref 0.44–1.00)
GFR, Estimated: >60 mL/min (ref >60)
Glucose, Bld: 213 mg/dL — ABNORMAL HIGH (ref 70–99)
Potassium: 2.9 mmol/L — ABNORMAL LOW (ref 3.5–5.1)
Sodium: 148 mmol/L — ABNORMAL HIGH (ref 135–145)
Total Bilirubin: 26.9 mg/dL (ref 0.3–1.2)
Total Protein: 5.7 g/dL — ABNORMAL LOW (ref 6.5–8.1)

## 2022-03-31 LAB — MAGNESIUM: Magnesium: 2.2 mg/dL (ref 1.7–2.4)

## 2022-03-31 LAB — BRAIN NATRIURETIC PEPTIDE: B Natriuretic Peptide: 285.7 pg/mL — ABNORMAL HIGH (ref 0.0–100.0)

## 2022-03-31 LAB — PROCALCITONIN: Procalcitonin: 0.87 ng/mL

## 2022-03-31 LAB — CULTURE, BLOOD (ROUTINE X 2): Culture: NO GROWTH

## 2022-03-31 LAB — PROTIME-INR
INR: 2.2 — ABNORMAL HIGH (ref 0.8–1.2)
Prothrombin Time: 24.5 seconds — ABNORMAL HIGH (ref 11.4–15.2)

## 2022-03-31 LAB — POTASSIUM: Potassium: 3 mmol/L — ABNORMAL LOW (ref 3.5–5.1)

## 2022-03-31 MED ORDER — DIPHENHYDRAMINE HCL 50 MG/ML IJ SOLN: 25.0000 mg | INTRAMUSCULAR | Status: DC | PRN

## 2022-03-31 MED ORDER — HYDROMORPHONE HCL-NACL 50-0.9 MG/50ML-% IV SOLN
0.0000 mg/h | INTRAVENOUS | Status: DC
  Administered 2022-03-31 – 2022-04-01 (×2): 1 mg/h via INTRAVENOUS
  Filled 2022-03-31 (×3): qty 50

## 2022-03-31 MED ORDER — SODIUM CHLORIDE 0.9 % IV SOLN
400.0000 mg | INTRAVENOUS | Status: DC | PRN
  Filled 2022-03-31: qty 100
  Filled 2022-03-31: qty 4
  Filled 2022-03-31: qty 100

## 2022-03-31 MED ORDER — GLYCOPYRROLATE 0.2 MG/ML IJ SOLN: 0.2000 mg | INTRAMUSCULAR | Status: DC | PRN

## 2022-03-31 MED ORDER — IBUPROFEN 100 MG/5ML PO SUSP
400.0000 mg | Freq: Four times a day (QID) | ORAL | Status: DC | PRN
  Filled 2022-03-31: qty 20

## 2022-03-31 MED ORDER — HYDROMORPHONE BOLUS VIA INFUSION
1.0000 mg | INTRAVENOUS | Status: DC | PRN
  Administered 2022-03-31: 1 mg via INTRAVENOUS

## 2022-03-31 MED ORDER — GLYCOPYRROLATE 1 MG PO TABS: 1.0000 mg | ORAL_TABLET | ORAL | Status: DC | PRN

## 2022-03-31 MED ORDER — HALOPERIDOL LACTATE 5 MG/ML IJ SOLN: 2.5000 mg | INTRAMUSCULAR | Status: DC | PRN

## 2022-03-31 MED ORDER — POTASSIUM CHLORIDE 10 MEQ/100ML IV SOLN
10.0000 meq | INTRAVENOUS | Status: DC
  Administered 2022-03-31 (×2): 10 meq via INTRAVENOUS
  Filled 2022-03-31: qty 100

## 2022-03-31 MED ORDER — POLYVINYL ALCOHOL 1.4 % OP SOLN: 1.0000 [drp] | Freq: Four times a day (QID) | OPHTHALMIC | Status: DC | PRN

## 2022-03-31 MED ORDER — LORAZEPAM 2 MG/ML IJ SOLN
2.0000 mg | INTRAMUSCULAR | Status: DC | PRN
  Administered 2022-03-31 (×2): 2 mg via INTRAVENOUS
  Filled 2022-03-31 (×2): qty 1

## 2022-03-31 MED ORDER — SODIUM CHLORIDE 0.9 % IV SOLN
400.0000 mg | Freq: Once | INTRAVENOUS | Status: DC
  Administered 2022-03-31: 400 mg via INTRAVENOUS

## 2022-03-31 MED ORDER — PHYTONADIONE 5 MG PO TABS
10.0000 mg | ORAL_TABLET | Freq: Once | ORAL | Status: DC
  Filled 2022-03-31: qty 2

## 2022-03-31 MED ORDER — FUROSEMIDE 10 MG/ML IJ SOLN
20.0000 mg | Freq: Two times a day (BID) | INTRAMUSCULAR | Status: DC
  Administered 2022-03-31: 20 mg via INTRAVENOUS
  Filled 2022-03-31: qty 2

## 2022-03-31 NOTE — Plan of Care (Signed)
  Problem: Clinical Measurements: Goal: Cardiovascular complication will be avoided Outcome: Progressing   Problem: Coping: Goal: Level of anxiety will decrease Outcome: Progressing   Problem: Elimination: Goal: Will not experience complications related to bowel motility Outcome: Progressing Goal: Will not experience complications related to urinary retention Outcome: Progressing   Problem: Pain Managment: Goal: General experience of comfort will improve Outcome: Progressing   

## 2022-03-31 NOTE — Progress Notes (Signed)
Seen and examined at bedside. 4 family members present, including her husband and her mother. Multiple Rts and RN present when I arrived.   BP 90/76   Pulse (!) 118   Temp 99.5 F (37.5 C) (Oral)   Resp (!) 39   Ht 5\' 5"  (1.651 m)   Wt 95.7 kg   LMP  (LMP Unknown)   SpO2 92%   BMI 35.11 kg/m  Ill appearing Very tachypneic, rate in upper 30s, on HHFNC with saturations in low 90s. Rhales bilaterally Tachycardic, reg rhythm, apical murmur Abd soft, NT Very jaundiced, spider angiomas on cheeks Thin bloody fluid in rectal tube Confused, waking up only briefly to stimulation, falls back asleep. Moving some, but not following commands.   CXR personally reviewed-diffuse bilateral opacities WBC 20.4 H/H 9.8/29.5 Bilirubin 26.9 Bicarb 19 BUN 12 Creatinine 0.3  Updated family at bedside.  Prognosis here is grim with severe comorbidities despite her young age.  She has worsened throughout the hospital stay so far.  She is at the point where she would require transfer to the ICU and intubation vs focusing more on her comfort.  Chronic comorbidities I am not sure if invasive mechanical ventilation is going to overall change her trajectory.  Family will discuss.  RN updated.  Full consult note to follow.  03-04-1993, DO 03/31/22 12:08 PM [facility]

## 2022-03-31 NOTE — Plan of Care (Signed)
  Problem: Clinical Measurements: Goal: Ability to maintain clinical measurements within normal limits will improve Outcome: Progressing Goal: Diagnostic test results will improve Outcome: Progressing Goal: Respiratory complications will improve Outcome: Progressing Goal: Cardiovascular complication will be avoided Outcome: Progressing   Problem: Activity: Goal: Risk for activity intolerance will decrease Outcome: Progressing   Problem: Nutrition: Goal: Adequate nutrition will be maintained Outcome: Progressing   Problem: Coping: Goal: Level of anxiety will decrease Outcome: Progressing   Problem: Elimination: Goal: Will not experience complications related to bowel motility Outcome: Progressing Goal: Will not experience complications related to urinary retention Outcome: Progressing   Problem: Pain Managment: Goal: General experience of comfort will improve Outcome: Progressing

## 2022-03-31 NOTE — IPAL (Signed)
  Interdisciplinary Goals of Care Family Meeting   Date carried out:: 03/31/2022  Location of the meeting: Bedside  Member's involved: Physician, Bedside Registered Nurse, and Family Member or next of kin  Durable Power of Attorney or acting medical decision maker: husband    Discussion: We discussed goals of care for [employer] .  I met with 4 of Ms. Mikaelian' family members including her husband. They understand she is in MOF and has severe respiratory failure. With the severity of her respiratory failure she is at the point of needing medications to relieve air hunger or intubation & MV. I relayed to her family my concerns that she is not likely to survive critical illness with her severe underlying liver disease and overall decline since admission. Her family has discussed and understand that mechanical ventilation is not likely to benefit her. They want to focus on keeping her comfortable. They would like the chaplain to be involved in her care. Drs. Rai and Armbruster aware and agree with the plan. Comfort orders placed, d/w RN.  Code status: Full DNR  Disposition: In-patient comfort care   Time spent for the meeting: 30 min.  Julian Hy 03/31/2022, 1:20 PM

## 2022-03-31 NOTE — Progress Notes (Signed)
Pt remains unresponsive to verbal stimuli.  Family including husband and mother at bedside.  Dilaudid IV gtt continues at 1mg /hr.  Temp is 103 axillary.  IV ibuprofen administered.  Goals of care is comfort for patient.  Discussed patient needs with family.  Family continues to be in full agreement for comfort care.  Family requested pt receive a dose of ativan due to patient moaning and seemingly uncomfortable.   Ativan 2mg  IV given. Will continue to support patient and family.  Emotional support given.

## 2022-03-31 NOTE — Progress Notes (Signed)
BP 90/76   Pulse (!) 120   Temp (!) 100.9 F (38.3 C) (Axillary)   Resp (!) 46   Ht 5\' 5"  (1.651 m)   Wt 95.7 kg   LMP  (LMP Unknown)   SpO2 91%   BMI 35.11 kg/m  Breathing comfortably on HHF Sleeping Husband at bedside.   , DO 03/31/22 3:31 PM [facility]

## 2022-03-31 NOTE — Progress Notes (Signed)
Progress Note   Subjective  Patient having abdominal pain in lower abdomen today, given dilauded for pain and not as alert. Family concerned about this change for her. Also having some rectal bleeding as well. INR has looked better today, and bili slightly lower. Eye exam yesterday showed no evidence of Wilson's   Objective   Vital signs in last 24 hours: Temp:  [98.1 F (36.7 C)-100.5 F (38.1 C)] 98.1 F (36.7 C) (12/13 0700) Pulse Rate:  [93-116] 116 (12/13 0800) Resp:  [19-45] 38 (12/13 0800) BP: (90-116)/(52-78) 116/78 (12/13 0800) SpO2:  [88 %-96 %] 88 % (12/13 0800) Last BM Date : 03/30/22 General:    white female jaundiced, complaining of pain, not responding to questioning Abdomen:  Soft, diffusely tender.   Intake/Output from previous day: 12/12 0701 - 12/13 0700 In: 220 [P.O.:120; IV Piggyback:100] Out: 1835 [Urine:1000; Stool:835] Intake/Output this shift: Total I/O In: 60 [P.O.:60] Out: -   Lab Results: Recent Labs    03/29/22 0034 03/30/22 0024 03/31/22 0022  WBC 21.6* 23.3* 20.4*  HGB 10.4* 10.6* 9.8*  HCT 30.6* 30.2* 29.5*  PLT 141* 170 175   BMET Recent Labs    03/29/22 0034 03/29/22 1518 03/30/22 0024 03/31/22 0022  NA 141  --  146* 148*  K 2.6* 3.0* 3.3* 2.9*  CL 107  --  115* 120*  CO2 21*  --  22 19*  GLUCOSE 152*  --  170* 213*  BUN 10  --  14 12  CREATININE 1.27*  --  1.01* 0.83  CALCIUM 7.9*  --  8.4* 8.5*   LFT Recent Labs    03/31/22 0022  PROT 5.7*  ALBUMIN 2.0*  AST 69*  ALT 23  ALKPHOS 217*  BILITOT 26.9*   PT/INR Recent Labs    03/30/22 0024 03/31/22 0759  LABPROT 39.5* 24.5*  INR 4.1* 2.2*    Studies/Results: DG CHEST PORT 1 VIEW  Result Date: 03/31/2022 CLINICAL DATA:  . dyspnea EXAM: PORTABLE CHEST 1 VIEW COMPARISON:  03/19/2022 FINDINGS: Bilateral patchy airspace disease. Small left pleural effusion. No right pleural effusion. No pneumothorax. Stable cardiomediastinal silhouette. No aggressive  osseous lesion. IMPRESSION: 1. Bilateral patchy airspace disease concerning for multilobar pneumonia. Electronically Signed   By: Kathreen Devoid M.D.   On: 03/31/2022 09:17       Assessment / Plan:    36 year old female with multiple medical problems, history of alcohol abuse, chronic pancreatitis, cardiac arrest with prolonged QT this past August, admitted with jaundice /liver failure with ascites, coagulopathy and profound hyperbilirubinemia, AST > ALT elevation with leukocytosis, no evidence of SBP on paracentesis.  She has admitted to alcohol use during this admission.   Presumed diagnosis is liver failure from alcoholic hepatitis with underlying cirrhosis. Day #4 prednisolone 40mg  / day. She is not a transplant candidate. Course complicated by hypoxia and difficulty breathing recently, ARDS suspected, on oxygen.  She has also had some rectal bleeding in recent days although hemoglobin had been relatively stable.  She is a poor colonoscopy candidate, INR had been greater than 4.  On prednisolone, rifaximin and lactulose, yesterday her mental status was improved.  Her INR is improved today, bili slightly lower, renal function stable which is good.  However, she is in a fair amount of abdominal pain which is new, and ongoing rectal bleeding.  Given Dilaudid which has made her encephalopathy worse and now not responding as well as she was before.  Discussed options with the family,  I think CTA is reasonable to evaluate her rectal bleeding and abdominal pain given her renal function is normal.  They wish to proceed with this.  If negative can consider another paracentesis to rule out SBP in the setting of steroids.  She remains a poor colonoscopy candidate, I do not think she can drink bowel prep, could consider flex sig with enema if bleeding persists and CTA negative.  Prognosis remains quite poor, at risk for mortality during this admission and family understands this.   PLAN: - CTA this AM -  continue prednisolone for now - continue lactulose / rifaximin - continue vitamin K 10mg  / day and trend INR - await pending serologies - antibiotics per primary service - patient now DNR/DNI per discussion with palliative care, appreciate their input in this difficult situation  Call with questions.  , MD [facility]

## 2022-03-31 NOTE — Consult Note (Signed)
NAME:  Kara [name], MRN:  [id number], DOB:  [birth year], LOS: 5 ADMISSION DATE:  04/02/2022, CONSULTATION DATE:  12/13 REFERRING MD:  Tana Coast, CHIEF COMPLAINT:  pneumonia    History of Present Illness:  36 year old female admitted 12/8 w/ abd pain, jaundice w/ presumptive working dx of acute liver failure from ETOH related hepatitis superimposed on underlying cirrhosis. SHe has been treated w/ prednisolone 78m/d.,paracentesis was negative for evidence of SBP. Course c/b rectal bleeding, worsening mental status following dilaudid and progressive respiratory failure. PCCM asked to evaluate   Pertinent  Medical History  Prior ETOH abuse, sober x 6 yrs, chronic pancreatitis, prior splenic vein thrombus, not on AC due to intra-abd hematoma, prior trach (decannulated)  Cardiac arrest and subsequent anoxic brain injury-8/23 Cirrhosis Neuropathy   Significant Hospital Events: Including procedures, antibiotic start and stop dates in addition to other pertinent events   12 /8 admitted 12/13 PCCM consult for worsening hypoxia  Interim History / Subjective:    Objective   Blood pressure 90/76, pulse (Abnormal) 118, temperature 99.5 F (37.5 C), temperature source Oral, resp. rate (Abnormal) 39, height _0  (1.651 m), weight 95.7 kg, SpO2 92 %.    FiO2 (%):  [80 %-100 %] 80 %   Intake/Output Summary (Last 24 hours) at 03/31/2022 1203 Last data filed at 03/31/2022 0800 Gross per 24 hour  Intake 280 ml  Output 1635 ml  Net -1355 ml   Filed Weights   03/27/22 0347 03/28/22 0200 03/29/22 04235 Weight: 91.8 kg 94.8 kg 95.7 kg    Examination: General: ill appearing woman lying in bed in NAD HENT: [place]/AT, eyes anicteric Lungs: rhales bilaterally, tachypneic, no rhonchi Cardiovascular: S1S2, tachycardic, reg rhyth, Abdomen: distended, soft Extremities: minimal muscle mass, mild edema Neuro: confused, arouses to stimulation but falls back asleep quickly, moving non purposefully but not  following commands  Resolved Hospital Problem list     Assessment & Plan:  Acute respiratory failure with hypoxia; most likely ARDS from progressive pneumonia bilaterally MOF Cirrhosis, acutely decompensated Acute alcoholic hepatitis History of anoxic brain injury H/o ETOH abuse, ETOH-cirrhosis AKI  Coagulopathy 2/2 cirrhosis Thrombocytopenia 2/2 cirrhosis Hepatic encephalopathy, likely complicated by septic encephalopathy Likely aspiration 2/2 encephalopathy Qtc prolongation H/o HTN Depression & anxiety Chronic HFpEF Hypernatremia NAGMA AKI, improving Anemia, chronic  -MOF and progression to critical illness this admission portends a poor prognosis. She unfortunately has progressed to the point that she would require intubation for severity of respiratory failure for full supporitve care. Her family and I both agree that this is not likely to benefit her and I do not think she is likely to survive this hospital stay.  See separate ipal note for details of discussion with family. Comfort care ordered placed. D/w Drs. Rai and Armbruster who both agree that her prognosis is guarded. -Main symptoms being addressed currently are air hunger and anxiety with agitation. Discussed orders with RN.  PCCM will be available as needed. Please call with questions.    Best Practice (right click and "Reselect all SmartList Selections" daily)   Per primary.  Labs   CBC: Recent Labs  Lab 04/04/2022 1441 03/27/22 0643 03/28/22 0026 03/29/22 0034 03/30/22 0024 03/31/22 0022  WBC 18.6* 24.2* 21.5* 21.6* 23.3* 20.4*  NEUTROABS 14.9*  --   --  20.3*  --   --   HGB 12.1 11.9* 10.7* 10.4* 10.6* 9.8*  HCT 34.9* 33.9* 31.3* 30.6* 30.2* 29.5*  MCV 92.6 91.9 94.8 95.3 93.2 95.8  PLT 171  166 136* 141* 170 628    Basic Metabolic Panel: Recent Labs  Lab 03/27/22 0643 03/28/22 0026 03/29/22 0034 03/29/22 1518 03/30/22 0024 03/31/22 0022  NA 136 137 141  --  146* 148*  K 2.7* 2.8* 2.6*  3.0* 3.3* 2.9*  CL 100 106 107  --  115* 120*  CO2 20* 20* 21*  --  22 19*  GLUCOSE 108* 119* 152*  --  170* 213*  BUN _0 --  14 12  CREATININE 1.30* 1.55* 1.27*  --  1.01* 0.83  CALCIUM 7.7* 7.6* 7.9*  --  8.4* 8.5*  MG 2.1 2.1  --   --  2.2 2.2   GFR: Estimated Creatinine Clearance: 106.2 mL/min (by C-G formula based on SCr of 0.83 mg/dL). Recent Labs  Lab 04/11/2022 1441 03/27/22 0643 03/28/22 0026 03/29/22 0034 03/30/22 0024 03/31/22 0022 03/31/22 0759  PROCALCITON  --   --   --   --   --   --  0.87  WBC 18.6*   < > 21.5* 21.6* 23.3* 20.4*  --   LATICACIDVEN 1.3  --   --   --   --   --   --    < > = values in this interval not displayed.    Liver Function Tests: Recent Labs  Lab 03/27/22 0643 03/28/22 0026 03/29/22 0034 03/30/22 0024 03/31/22 0022  AST 84* 65* 56* 60* 69*  ALT _1 ALKPHOS 316* 274* 243* 234* 217*  BILITOT 25.3* 28.3* 26.8* 28.1* 26.9*  PROT 5.9* 6.0* 6.1* 5.9* 5.7*  ALBUMIN <1.5* 1.9* 2.0* 2.1* 2.0*   Recent Labs  Lab 04/05/2022 1441  LIPASE 62*   Recent Labs  Lab 03/31/2022 1552  AMMONIA 67*    ABG    Component Value Date/Time   PHART 7.348 (L) 11/22/2021 0356   PCO2ART 32.3 11/22/2021 0356   PO2ART 230 (H) 11/22/2021 0356   HCO3 17.7 (L) 11/22/2021 0356   TCO2 19 (L) 11/22/2021 0356   ACIDBASEDEF 7.0 (H) 11/22/2021 0356   O2SAT 100 11/22/2021 0356     Coagulation Profile: Recent Labs  Lab 04/03/2022 1552 03/29/22 0034 03/30/22 0024 03/31/22 0759  INR 3.0* 4.2* 4.1* 2.2*    Cardiac Enzymes: No results for input(s): "CKTOTAL", "CKMB", "CKMBINDEX", "TROPONINI" in the last 168 hours.  HbA1C: Hgb A1c MFr Bld  Date/Time Value Ref Range Status  11/27/2021 04:57 AM 4.5 (L) 4.8 - 5.6 % Final    Comment:    (NOTE) Pre diabetes:          5.7%-6.4%  Diabetes:              >6.4%  Glycemic control for   <7.0% adults with diabetes   11/13/2021 07:30 PM 4.5 (L) 4.8 - 5.6 % Final    Comment:    (NOTE) Pre  diabetes:          5.7%-6.4%  Diabetes:              >6.4%  Glycemic control for   <7.0% adults with diabetes     CBG: No results for input(s): "GLUCAP" in the last 168 hours.  Review of Systems:   Unable to be obtained due to mental status.  Past Medical History:  She,  has a past medical history of Alcoholism in remission ([place]), Anemia, AR (allergic rhinitis), Biliary dyskinesia, CAP (community acquired pneumonia) (06/13/2016), Cardiac arrest ([place]) (63/81/7711), Diastolic CHF ([place]), Environmental allergies, FUO (fever of  unknown origin), Generalized tonic-clonic seizure ([place]) (01/20/2021), GERD (gastroesophageal reflux disease), HCAP (healthcare-associated pneumonia) (07/31/2016), Headache, History of acute pancreatitis, History of sepsis (06/12/2016), History of syncope, History of thrombosis (01/29/2015), Hypertension, Hypokalemia (04/08/2014), Hypomagnesemia (12/15/2021), Hyponatremia (08/27/2016), Hypothermia (07/16/2014), Polyneuropathy, Prolonged QT syndrome, Respiratory failure with hypoxia ([place]) (07/31/2016), Sepsis ([place]) (06/12/2016), and Severe persistent asthma.   Surgical History:   Past Surgical History:  Procedure Laterality Date   CHOLECYSTECTOMY N/A 06/21/2018   Procedure: LAPAROSCOPIC CHOLECYSTECTOMY;  Surgeon: Coralie Keens, MD;  Location: [facility];  Service: General;  Laterality: N/A;   ESOPHAGOGASTRODUODENOSCOPY N/A 07/17/2014   Procedure: ESOPHAGOGASTRODUODENOSCOPY (EGD);  Surgeon: Teena Irani, MD;  Location: [facility];  Service: Endoscopy;  Laterality: N/A;   TYMPANOSTOMY TUBE PLACEMENT Bilateral 11 MONTHS OLD   VIDEO BRONCHOSCOPY Bilateral 10/08/2016   Procedure: VIDEO BRONCHOSCOPY WITH FLUORO;  Surgeon: Marshell Garfinkel, MD;  Location: [facility];  Service: Cardiopulmonary;  Laterality: Bilateral;     Social History:   reports that she has never smoked. She has never used smokeless tobacco. She reports that she does not currently use alcohol. She reports  that she does not currently use drugs.   Family History:  Her family history includes Colon polyps in her mother; Diabetes in her father; Heart failure in her father, maternal grandfather, and mother. There is no history of Colon cancer.   Allergies Allergies  Allergen Reactions   Maxzide [Hydrochlorothiazide W-Triamterene] Nausea Only and Other (See Comments)    Dizziness    Nsaids Diarrhea and Nausea And Vomiting   Other Hives, Itching and Other (See Comments)    TREE NUT - itching, hives   Peanut Butter Flavor Hives and Itching   Peanuts [Peanut Oil] Hives and Itching   Tylenol [Acetaminophen] Diarrhea, Nausea And Vomiting and Other (See Comments)    Elevated LFT    Wellbutrin [Bupropion] Other (See Comments)    Lowered seizure threshold     Home Medications  Prior to Admission medications   Medication Sig Start Date End Date Taking? Authorizing Provider  albuterol (VENTOLIN HFA) 108 (90 Base) MCG/ACT inhaler Inhale 2 puffs into the lungs every 6 (six) hours as needed for shortness of breath. 03/02/22  Yes [provider]  atorvastatin (LIPITOR) 10 MG tablet TAKE 1 TABLET(10 MG) BY MOUTH AT BEDTIME Patient taking differently: Take 10 mg by mouth every evening. 01/26/22  Yes Conte, Tessa N, PA-C  budesonide (PULMICORT) 0.25 MG/2ML nebulizer solution Take 2 mLs (0.25 mg total) by nebulization 2 (two) times daily. 11/26/21  Yes Charlynne Cousins, MD  busPIRone (BUSPAR) 5 MG tablet Take 1-2 tablets (5-10 mg total) by mouth 3 (three) times daily as needed (anxiety). For anxiety- take 1-2 tabs as needed- 01/18/22  Yes Lovorn, Jinny Blossom, MD  cetirizine (ZYRTEC) 10 MG tablet TAKE 1 TABLET(10 MG) BY MOUTH DAILY Patient taking differently: Take 10 mg by mouth daily. 01/07/22  Yes Lovorn, Jinny Blossom, MD  diphenhydrAMINE (BENADRYL) 25 MG tablet Take 25 mg by mouth every 6 (six) hours as needed for itching.   Yes [provider]  folic acid (FOLVITE) 1 MG tablet TAKE 1 TABLET(1 MG) BY  MOUTH DAILY Patient taking differently: Take 1 mg by mouth daily. 01/11/22  Yes Lovorn, Jinny Blossom, MD  gabapentin (NEURONTIN) 300 MG capsule TAKE 3 CAPSULES(900 MG) BY MOUTH TWICE DAILY Patient taking differently: Take 900 mg by mouth 2 (two) times daily. 01/11/22  Yes Lovorn, Jinny Blossom, MD  Lacosamide 100 MG TABS Take 1 tablet (100 mg total) by mouth 2 (two) times  daily. 12/16/21  Yes Sater, Nanine Means, MD  lidocaine (LIDODERM) 5 % Place 1 patch onto the skin daily. Remove & Discard patch within 12 hours or as directed by MD Patient taking differently: Place 1 patch onto the skin daily as needed (for pain). 12/15/21  Yes Love, Ivan Anchors, PA-C  magnesium oxide (MAG-OX) 400 (240 Mg) MG tablet TAKE 2 TABLETS(800 MG) BY MOUTH TWICE DAILY Patient taking differently: Take 400 mg by mouth daily. 03/16/22  Yes Lovorn, Megan, MD  melatonin 3 MG TABS tablet Take 3 mg by mouth at bedtime.   Yes [provider]  metFORMIN (GLUCOPHAGE) 500 MG tablet Take 1 tablet (500 mg total) by mouth 2 (two) times daily with a meal. 12/15/21  Yes Love, Ivan Anchors, PA-C  metoprolol tartrate (LOPRESSOR) 25 MG tablet TAKE 1 TABLET(25 MG) BY MOUTH TWICE DAILY Patient taking differently: Take 12.5 mg by mouth 2 (two) times daily. 01/26/22  Yes Conte, Tessa N, PA-C  montelukast (SINGULAIR) 10 MG tablet TAKE ONE TABLET BY MOUTH AT BEDTIME Patient taking differently: Take 10 mg by mouth at bedtime. 12/13/19  Yes Ambs, Kathrine Cords, FNP  naloxone [facility]) nasal spray 4 mg/0.1 mL SMARTSIG:Both Nares 12/17/21  Yes [provider]  omeprazole (PRILOSEC) 40 MG capsule TAKE 1 CAPSULE(40 MG) BY MOUTH DAILY Patient taking differently: Take 40 mg by mouth daily. 01/08/20  Yes Ambs, Kathrine Cords, FNP  oxyCODONE (ROXICODONE) 15 MG immediate release tablet Take 0.5 tablets (7.5 mg total) by mouth every 8 (eight) hours as needed for pain. This is an increase from  5 mg to 7.5 mg three times a day as needed. Ninety pills were filled on 10/21/21 prior to your  admission on 07/19. Now that you are taking less you should have almost a month supply. Patient taking differently: Take 10 mg by mouth every 8 (eight) hours as needed for pain. This is an increase from  5 mg to 7.5 mg three times a day as needed. Ninety pills were filled on 10/21/21 prior to your admission on 07/19. Now that you are taking less you should have almost a month supply. 12/15/21  Yes Love, Ivan Anchors, PA-C  Pancrelipase, Lip-Prot-Amyl, (ZENPEP) 20000-63000 units CPEP Take 2 capsules by mouth 3 (three) times daily. 12/15/21  Yes Love, Ivan Anchors, PA-C  polycarbophil (FIBERCON) 625 MG tablet Take 1 tablet (625 mg total) by mouth 2 (two) times daily. 12/15/21  Yes Love, Ivan Anchors, PA-C  polyvinyl alcohol (LIQUIFILM TEARS) 1.4 % ophthalmic solution Place 1 drop into both eyes as needed for dry eyes. Patient taking differently: Place 1 drop into both eyes daily as needed for dry eyes. 11/26/21  Yes Charlynne Cousins, MD  Probiotic Product (DIGESTIVE ADVANTAGE) CAPS Take 1 capsule by mouth daily.   Yes [provider]  saccharomyces boulardii (FLORASTOR) 250 MG capsule Take 1 capsule (250 mg total) by mouth 2 (two) times daily. 12/15/21  Yes Love, Ivan Anchors, PA-C  sertraline (ZOLOFT) 100 MG tablet Take 1 tablet (100 mg total) by mouth daily. 12/15/21  Yes Love, Ivan Anchors, PA-C  thiamine (VITAMIN B-1) 100 MG tablet Take 1 tablet (100 mg total) by mouth daily. 12/15/21  Yes Love, Ivan Anchors, PA-C  traZODone (DESYREL) 150 MG tablet Take 0.5 tablets (75 mg total) by mouth at bedtime. 12/15/21  Yes Love, Ivan Anchors, PA-C  blood glucose meter kit and supplies KIT Dispense based on patient and insurance preference. Use up to four times daily as directed. 12/15/21  Love, Pamela S, PA-C  EPINEPHrine (EPIPEN 2-PAK) 0.3 mg/0.3 mL IJ SOAJ injection Inject 0.3 mLs (0.3 mg total) into the muscle once as needed (for severe allergic reaction). 07/11/18   Kozlow, Donnamarie Poag, MD     Critical care time: 65 min.      Julian Hy, DO 03/31/22 5:45 PM [facility]  For contact information, see Amion. If no response to pager, please call PCCM consult pager. After hours, 7PM- 7AM, please call [place].

## 2022-03-31 NOTE — Progress Notes (Signed)
Ok to leave flexiseal out per Dr Clark/critical care, unless incontinence becomes bothersome/uncomfortable

## 2022-03-31 NOTE — Progress Notes (Signed)
[facility]                                                                              Kara [name], is a 36 y.o. 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 She is also noticed that she is becoming more jaundiced in the last week. She reports occasional subjective fever and chills at home.    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  Not a transplant candidate due to ongoing alcohol use.  Hospitalization complicated with acute respiratory failure with hypoxia, suspected ARDS, rectal bleeding -GI consulted, on prednisone, rifaximin, lactulose, vitamin K, appreciate recommendations. -Liver Doppler without significant thrombosis. -Started on prednisolone on 12/10, poor prognosis -Abdominal pain today, rectal bleeding, GI recommending CTA.  If negative, consider paracentesis to rule out SBP.  Poor colonoscopy candidate.  Active problems Acute respiratory failure with hypoxia, ?  Suspected ARDS -Currently on O2 14 L, respiratory status tenuous suspected ARDS -Procalcitonin 0.8, WBCs trending down to 20.4, on steroids, BNP 285.7 -Placed on Lasix 20 mg IV twice daily -Chest x-ray today showed bilateral patchy airspace  disease concerning for multilobar pneumonia, currently on Rocephin 2 g IV daily and IV vancomycin -Will consult pulmonology.    SIRS -Meets SIRS criteria with tachycardiac hypoxic, tachypneic, leukocytosis -Currently on IV ceftriaxone and vancomycin   Acute kidney injury -Creatinine currently stable, Lasix was held on 12/12 -Continue midodrine, received albumin on 12/12   Hepatic encephalopathy -Continue lactulose, rifaximin -Oriented and able to respond to commands   Coagulopathy, thrombocytopenia -Due to liver disease -Platelet counts improving  Hypokalemia -Potassium 2.9.  Magnesium stable.   -Will replace IV   Hypernatremia -Continue to monitor  GI bleed -Hemoglobin 9.8, however having abdominal pain and rectal bleeding, GI following -Plan for CTA abdomen   History of seizure disorder -Continue home meds   Essential hypertension -Metoprolol held -BP currently stable, on midodrine  Anxiety, depression -BuSpar, Zoloft, trazodone currently held in the setting of acute liver injury   QT prolongation - History of this but QTc currently normal on EKG in ED.   Chronic diastolic CHF -2D echo 10/2021 showed EF of 60 to 65%, normal diastolic dysfunction, normal RV function -Continue low-dose Lasix 20 mg IV twice daily  Asthma -Continue albuterol, Pulmicort    Chronic pain - Continue home oxycodone   History of cardiac arrest August 2023, mild anoxic brain injury,  ataxia  - History of cardiac arrest in July with evidence of anoxic brain injury on imaging at that time as well some ataxia, trach at that time, now decannulated.    Goals of care -Discussed with patient's husband, mother, sister and sister-in-law extensively at the bedside today.  Very poor prognosis, liver failure with jaundice, extremely high MELD score, ongoing alcohol use, worsening respiratory status, GI bleed, multifocal pneumonia,?  ARDS.  Continue DNR/DNI.  Obesity Estimated body mass index is  35.11 kg/m as calculated from the following:   Height as of this encounter: 5\' 5"  (1.651 m).   Weight as of this encounter: 95.7 kg.  Code Status: DNR/DNI DVT Prophylaxis:  Place and maintain sequential compression device Start: 03/27/22 1532 SCDs Start: 03/24/2022 1855   Level of Care: Level of care: Progressive Family Communication: Updated patient's husband, mother, sister and sister-in-law at the bedside   Disposition Plan:      Remains inpatient appropriate:     Procedures:    Consultants:   Gastroenterology Palliative medicine  Antimicrobials:   Anti-infectives (From admission, onward)    Start     Dose/Rate Route Frequency Ordered Stop   03/31/22 1800  vancomycin (VANCOREADY) IVPB 1500 mg/300 mL        1,500 mg 150 mL/hr over 120 Minutes Intravenous Every 24 hours 03/30/22 1658     03/30/22 1745  vancomycin (VANCOREADY) IVPB 2000 mg/400 mL        2,000 mg 200 mL/hr over 120 Minutes Intravenous NOW 03/30/22 1658 03/30/22 2010   03/28/22 1600  rifaximin (XIFAXAN) tablet 550 mg        550 mg Oral 3 times daily 03/28/22 1514     03/28/22 1030  doxycycline (VIBRAMYCIN) 100 mg in sodium chloride 0.9 % 250 mL IVPB  Status:  Discontinued        100 mg 125 mL/hr over 120 Minutes Intravenous Every 12 hours 03/28/22 0944 03/28/22 1527   04/08/2022 2000  cefTRIAXone (ROCEPHIN) 2 g in sodium chloride 0.9 % 100 mL IVPB        2 g 200 mL/hr over 30 Minutes Intravenous Daily 03/31/2022 1935     04/07/2022 2000  metroNIDAZOLE (FLAGYL) IVPB 500 mg  Status:  Discontinued        500 mg 100 mL/hr over 60 Minutes Intravenous Every 12 hours 04/04/2022 1935 03/28/22 1527          Medications  budesonide  0.25 mg Nebulization BID   furosemide  20 mg Intravenous BID   gabapentin  900 mg Oral BID   lacosamide  100 mg Oral BID   lactulose  30 g Oral TID   lipase/protease/amylase  24,000 Units Oral TID WC   magnesium oxide  400 mg Oral Daily   midodrine  10 mg Oral TID WC   pantoprazole  (PROTONIX) IV  40 mg Intravenous Q12H   prednisoLONE  40 mg Oral Daily   rifaximin  550 mg Oral TID   sodium chloride flush  3 mL Intravenous Q12H   thiamine  100 mg Oral Daily      Subjective:   [name] was seen and examined today.  Awake and able to respond to some questions, appears to be in pain.  Lower abdominal pain, no fevers or chills.  Worsening respiratory status, on 14 L O2, rectal bleeding+.  Multiple family members at the bedside.  No fevers  Objective:   Vitals:   03/31/22 0900 03/31/22 1000 03/31/22 1058 03/31/22 1103  BP: 105/64 109/61    Pulse: (!) 109 (!) 112 (!) 117 (!) 118  Resp: (!) 38 (!) 25 (!) 34 (!) 39  Temp:      TempSrc:      SpO2: 90% 90% (!) 88% (!) 87%  Weight:      Height:        Intake/Output Summary (Last 24 hours) at 03/31/2022 1113 Last data filed at 03/31/2022 0800 Gross per 24 hour  Intake 280 ml  Output 1635 ml  Net -1355 ml     Wt Readings from Last 3 Encounters:  03/29/22 95.7 kg  01/26/22 88.5 kg  01/18/22 87.8 kg     Exam General: At the time of my examination, was able to respond to questions, jaundiced, complaining of pain Cardiovascular: S1 S2 auscultated,  RRR Respiratory: Diminished breath sounds at the bases, no wheezing Gastrointestinal: Diffusely tender, soft, ascites + Ext: no pedal edema bilaterally Neuro: moving extremities spontaneously Skin: Scleral icterus noted, jaundiced with petechiae Psych: somewhat somnolent but able to respond to some questions and commands    Data Reviewed:  I have personally reviewed following labs    CBC Lab Results  Component Value Date   WBC 20.4 (H) 03/31/2022   RBC 3.08 (L) 03/31/2022   HGB 9.8 (L) 03/31/2022   HCT 29.5 (L) 03/31/2022   MCV 95.8 03/31/2022   MCH 31.8 03/31/2022   PLT 175 03/31/2022   MCHC 33.2 03/31/2022   RDW 20.6 (H) 03/31/2022   LYMPHSABS 0.4 (L) 03/29/2022   MONOABS 0.9 03/29/2022   EOSABS 0.0 03/29/2022   BASOSABS 0.0 03/29/2022      Last metabolic panel Lab Results  Component Value Date   NA 148 (H) 03/31/2022   K 2.9 (L) 03/31/2022   CL 120 (H) 03/31/2022   CO2 19 (L) 03/31/2022   BUN 12 03/31/2022   CREATININE 0.83 03/31/2022   GLUCOSE 213 (H) 03/31/2022   GFRNONAA >60 03/31/2022   GFRAA >60 11/12/2019   CALCIUM 8.5 (L) 03/31/2022   PHOS 4.5 12/07/2021   PROT 5.7 (L) 03/31/2022   ALBUMIN 2.0 (L) 03/31/2022   LABGLOB 3.8 09/02/2014   LABGLOB 3.5 09/02/2014   AGRATIO 1.2 09/02/2014   BILITOT 26.9 (HH) 03/31/2022   ALKPHOS 217 (H) 03/31/2022   AST 69 (H) 03/31/2022   ALT 23 03/31/2022   ANIONGAP 9 03/31/2022    CBG (last 3)  No results for input(s): "GLUCAP" in the last 72 hours.    Coagulation Profile: Recent Labs  Lab 03/25/2022 1552 03/29/22 0034 03/30/22 0024 03/31/22 0759  INR 3.0* 4.2* 4.1* 2.2*     Radiology Studies: I have personally reviewed the imaging studies  DG CHEST PORT 1 VIEW  Result Date: 03/31/2022 CLINICAL DATA:  . dyspnea EXAM: PORTABLE CHEST 1 VIEW COMPARISON:  03/19/2022 FINDINGS: Bilateral patchy airspace disease. Small left pleural effusion. No right pleural effusion. No pneumothorax. Stable cardiomediastinal silhouette. No aggressive osseous lesion. IMPRESSION: 1. Bilateral patchy airspace disease concerning for multilobar pneumonia. Electronically Signed   By: Kathreen Devoid M.D.   On: 03/31/2022 09:17       Rosine Solecki M.D. [facility] 03/31/2022, 11:13 AM  Available via Epic secure chat 7am-7pm After 7 pm, please refer to night coverage provider listed on amion.

## 2022-04-01 DIAGNOSIS — K7290 Hepatic failure, unspecified without coma: Secondary | ICD-10-CM

## 2022-04-01 DIAGNOSIS — K852 Alcohol induced acute pancreatitis without necrosis or infection: Secondary | ICD-10-CM

## 2022-04-01 LAB — CULTURE, BODY FLUID W GRAM STAIN -BOTTLE: Culture: NO GROWTH

## 2022-04-01 LAB — LEPTOSPIRA AB SCREEN

## 2022-04-01 MED ORDER — SODIUM CHLORIDE 0.9 % IV SOLN
400.0000 mg | INTRAVENOUS | Status: DC | PRN
  Administered 2022-04-01: 400 mg via INTRAVENOUS
  Filled 2022-04-01: qty 100

## 2022-04-01 MED ORDER — SODIUM CHLORIDE 0.9 % IV SOLN
400.0000 mg | INTRAVENOUS | Status: DC | PRN
  Filled 2022-04-01: qty 4

## 2022-04-02 ENCOUNTER — Telehealth: Payer: Self-pay | Admitting: Cardiovascular Disease

## 2022-04-02 NOTE — Telephone Encounter (Signed)
Mother wanted to make dr Eden Emms aware that the patient has pass away

## 2022-04-02 NOTE — Telephone Encounter (Signed)
Will forward to Dr. Eden Emms to make him aware.

## 2022-04-19 NOTE — Progress Notes (Signed)
Wasted 43 ml of Dilaudid IV in stericycle.  Witness Rocco Pauls, RN.

## 2022-04-19 NOTE — Progress Notes (Signed)
This chaplain responded to PMT NP-Julia referral for EOL spiritual care.   The Pt. is unresponsive and comfortable surrounded by family in the setting of bedside storytelling. The chaplain understands the Pt. is loved for her uniqueness and perfectionism in her many gifts and talents.  The family gathered around the Pt. and joined the chaplain in the reading of scripture.  The chaplain invited the family to participate in F/U spiritual care as needed.  Chaplain Stephanie Acre [phone]

## 2022-04-19 NOTE — Progress Notes (Signed)
[facility]                                                                              Kara [name], is a 37 y.o. 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 She is also noticed that she is becoming more jaundiced in the last week. She reports occasional subjective fever and chills at home.    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  Not a transplant candidate due to ongoing alcohol use.  Hospitalization complicated with acute respiratory failure with hypoxia, suspected ARDS, rectal bleeding -GI consulted, was placed on prednisolone, rifaximin, lactulose, vitamin K -Liver Doppler without significant thrombosis. -Started on prednisolone on 12/10, poor prognosis -Comfort care status  Active problems Acute respiratory failure with hypoxia with ARDS -respiratory status tenuous suspected ARDS -Patient was placed on Lasix 20 mg IV twice daily -Chest x-ray on 12/13 showed bilateral patchy airspace disease concerning for multilobar pneumonia, on antibiotics, respiratory status continued to decline.  Pulmonology was consulted.  After extensive discussion with the patient's family, proceed with comfort care per their  wishes.   -Started on Dilaudid drip, titrate to comfort    SIRS -Met SIRS criteria with tachycardiac hypoxic, tachypneic, leukocytosis on admission    Acute kidney injury -Creatinine currently stable, Lasix was held on 12/12 -Was placed on midodrine, received albumin on 12/12 -Comfort care status   Hepatic encephalopathy -Patient was placed on lactulose, rifaximin, currently comfort care   Coagulopathy, thrombocytopenia -Due to liver disease   Hypokalemia -Was replaced IV   Hypernatremia   GI bleed -Hemoglobin 9.8, however having abdominal pain and rectal bleeding, GI was consulted and had recommended CTA abdomen however given declining MOF and respiratory failure, comfort care goals were pursued with patient's family's wishes.   History of seizure disorder, asthma, hypertension -Continue comfort care goals  Anxiety, depression Comfort care status   QT prolongation - History of this but QTc normal on EKG in ED.   Chronic diastolic CHF -2D echo 09/3843 showed EF of 60 to 36%, normal diastolic dysfunction, normal RV function -Patient was placed on low-dose Lasix IV for diuresis and volume overload/ARDS    Chronic pain - Continue home oxycodone   History of cardiac arrest August 2023, mild anoxic brain injury, ataxia  - History of cardiac arrest in July with evidence of anoxic brain injury on imaging at that time as well  some ataxia, trach at that time, now decannulated.    Goals of care -Extensive discussion with patient's family on 12/13 by myself and CCM, Dr. Carlis Abbott, now comfort care  Obesity Estimated body mass index is 35.11 kg/m as calculated from the following:   Height as of this encounter: _0  (1.651 m).   Weight as of this encounter: 95.7 kg.  Code Status: DNR/DNI DVT Prophylaxis:  SCDs Start: 04/04/2022 1855   Level of Care: Level of care: Palliative Care Family Communication: Updated and provided support to patient's family members including  mother, husband, sister and sister-in-law at the bedside   Disposition Plan:      Remains inpatient appropriate: Transfer to palliative floor   Procedures:    Consultants:   Gastroenterology Palliative medicine  Antimicrobials:   Anti-infectives (From admission, onward)    Start     Dose/Rate Route Frequency Ordered Stop   03/31/22 1800  vancomycin (VANCOREADY) IVPB 1500 mg/300 mL  Status:  Discontinued        1,500 mg 150 mL/hr over 120 Minutes Intravenous Every 24 hours 03/30/22 1658 03/31/22 1418   03/30/22 1745  vancomycin (VANCOREADY) IVPB 2000 mg/400 mL        2,000 mg 200 mL/hr over 120 Minutes Intravenous NOW 03/30/22 1658 03/30/22 2010   03/28/22 1600  rifaximin (XIFAXAN) tablet 550 mg  Status:  Discontinued        550 mg Oral 3 times daily 03/28/22 1514 10-Apr-2022 0812   03/28/22 1030  doxycycline (VIBRAMYCIN) 100 mg in sodium chloride 0.9 % 250 mL IVPB  Status:  Discontinued        100 mg 125 mL/hr over 120 Minutes Intravenous Every 12 hours 03/28/22 0944 03/28/22 1527   04/17/2022 2000  cefTRIAXone (ROCEPHIN) 2 g in sodium chloride 0.9 % 100 mL IVPB  Status:  Discontinued        2 g 200 mL/hr over 30 Minutes Intravenous Daily 03/20/2022 1935 03/31/22 1327   03/29/2022 2000  metroNIDAZOLE (FLAGYL) IVPB 500 mg  Status:  Discontinued        500 mg 100 mL/hr over 60 Minutes Intravenous Every 12 hours 04/17/2022 1935 03/28/22 1527          Medications  furosemide  20 mg Intravenous BID   lactulose  30 g Oral TID   pantoprazole (PROTONIX) IV  40 mg Intravenous Q12H   sodium chloride flush  3 mL Intravenous Q12H      Subjective:   Kara [name] was seen and examined today.  Unresponsive, on Dilaudid drip.  Spiking fevers, tachycardia, shallow breathing, hypoxia.  Family at the bedside   Objective:   Vitals:   03/31/22 2100 03/31/22 2300 April 10, 2022 0200 10-Apr-2022 0429  BP:      Pulse: (!) 119 (!) 113  (!) 119  Resp: (!) _1 Temp:  (!) 103.1 F (39.5 C) (!)  103.1 F (39.5 C)   TempSrc:  Axillary Axillary   SpO2: (!) 88% (!) 88%  (!) 85%  Weight:      Height:        Intake/Output Summary (Last 24 hours) at 2022-04-10 1218 Last data filed at 04-10-2022 0626 Gross per 24 hour  Intake 216.06 ml  Output --  Net 216.06 ml     Wt Readings from Last 3 Encounters:  03/29/22 95.7 kg  01/26/22 88.5 kg  01/18/22 87.8 kg   Physical Exam General: Unresponsive Cardiovascular: S1 S2 clear, RRR.tachy,  Tachycardia Respiratory: Scattered rhonchi  bilaterally Gastrointestinal:  Ext: no pedal edema bilaterally Neuro: unresponsive  Skin: jaundice  Psych: unresponsive    Data Reviewed:  I have personally reviewed following labs    CBC Lab Results  Component Value Date   WBC 20.4 (H) 03/31/2022   RBC 3.08 (L) 03/31/2022   HGB 9.8 (L) 03/31/2022   HCT 29.5 (L) 03/31/2022   MCV 95.8 03/31/2022   MCH 31.8 03/31/2022   PLT 175 03/31/2022   MCHC 33.2 03/31/2022   RDW 20.6 (H) 03/31/2022   LYMPHSABS 0.4 (L) 03/29/2022   MONOABS 0.9 03/29/2022   EOSABS 0.0 03/29/2022   BASOSABS 0.0 18/55/0158     Last metabolic panel Lab Results  Component Value Date   NA 148 (H) 03/31/2022   K 3.0 (L) 03/31/2022   CL 120 (H) 03/31/2022   CO2 19 (L) 03/31/2022   BUN 12 03/31/2022   CREATININE 0.83 03/31/2022   GLUCOSE 213 (H) 03/31/2022   GFRNONAA >60 03/31/2022   GFRAA >60 11/12/2019   CALCIUM 8.5 (L) 03/31/2022   PHOS 4.5 12/07/2021   PROT 5.7 (L) 03/31/2022   ALBUMIN 2.0 (L) 03/31/2022   LABGLOB 3.8 09/02/2014   LABGLOB 3.5 09/02/2014   AGRATIO 1.2 09/02/2014   BILITOT 26.9 (HH) 03/31/2022   ALKPHOS 217 (H) 03/31/2022   AST 69 (H) 03/31/2022   ALT 23 03/31/2022   ANIONGAP 9 03/31/2022    CBG (last 3)  No results for input(s): "GLUCAP" in the last 72 hours.    Coagulation Profile: Recent Labs  Lab 03/31/2022 1552 03/29/22 0034 03/30/22 0024 03/31/22 0759  INR 3.0* 4.2* 4.1* 2.2*     Radiology Studies: I have personally  reviewed the imaging studies  DG CHEST PORT 1 VIEW  Result Date: 03/31/2022 CLINICAL DATA:  . dyspnea EXAM: PORTABLE CHEST 1 VIEW COMPARISON:  03/19/2022 FINDINGS: Bilateral patchy airspace disease. Small left pleural effusion. No right pleural effusion. No pneumothorax. Stable cardiomediastinal silhouette. No aggressive osseous lesion. IMPRESSION: 1. Bilateral patchy airspace disease concerning for multilobar pneumonia. Electronically Signed   By: Kathreen Devoid M.D.   On: 03/31/2022 09:17       Anya Murphey M.D. [facility] 2022/04/15, 12:18 PM  Available via Epic secure chat 7am-7pm After 7 pm, please refer to night coverage provider listed on amion.

## 2022-04-19 NOTE — Progress Notes (Signed)
Palliative Medicine Progress Note   Patient Name: Kara [name]       Date: April 03, 2022 DOB: 01-Mar-1986  Age: 37 y.o. MRN#: [id number] Attending Physician: Cathren Harsh, MD Primary Care Physician: [name], MD Admit Date: 03/19/2022    HPI/Patient Profile: 37 y.o. 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 She presented to [facility] ED on 04/10/2022 with abdominal pain, nausea, vomiting, and diarrhea. She was admitted to [facility] service with jaundice, LFT elevation, and acute liver disease. Palliative Medicine was consulted for goals of care in the setting of severe liver failure.  Subjective: Chart reviewed. Patient had an acute decline in respiratory status yesterday and was transitioned to comfort care.   Bedside visit. Patient appears comfortable. She is on dilaudid drip at 1 mg/hr. She is unresponsive to voice and light touch. No non-verbal signs of pain or discomfort noted. Respirations are even and unlabored. No excessive respiratory secretions noted. Multiple family members are present at bedside. Education and counseling provided on expectations at EOL. Emotional support provided.   Patient remains on HFNC at 30 L. We discussed weaning oxygen as tolerated so as not to prolong the dying process. Family understands and is agreeable.     Objective:  Physical Exam Vitals reviewed.  Constitutional:      Appearance: She is ill-appearing.  Pulmonary:     Effort: No respiratory distress.  Skin:    Coloration: Skin is jaundiced.   Neurological:     Mental Status: She is unresponsive.             Vital Signs: BP (!) 91/51 (BP Location: Left Arm)   Pulse (!) 120   Temp (!) 103.1 F (39.5 C) (Axillary)   Resp 19   Ht 5\' 5"  (1.651 m)   Wt 95.7 kg   LMP  (LMP Unknown)   SpO2 (!) 79%   BMI 35.11 kg/m  SpO2: SpO2: (!) 79 % O2 Device: O2 Device: High Flow Nasal Cannula O2 Flow Rate: O2 Flow Rate (L/min): 30 L/min      Palliative Assessment/Data: PPS 10%     Palliative Medicine Assessment &  Plan   Assessment: Active Problems:   LFT elevation   Chronic diastolic heart failure (HCC)   Abdominal pain   Chronic lower back pain   Sinus tachycardia   Splenic vein thrombosis   Alcohol induced acute pancreatitis without necrosis or infection   Chronic pancreatitis (HCC)   Gastroesophageal reflux disease   Asthma, severe persistent, well-controlled   Depression   History of cardiac arrest   Seizure disorder (HCC)   Tracheostomy status (HCC)   Anoxic brain injury ([place])   Jaundice   Hyperbilirubinemia   Decompensated hepatic cirrhosis (HCC)   Alcoholic cirrhosis of liver with ascites (HCC)   Encephalopathy, hepatic (HCC)   Alcoholic hepatitis with ascites   Rectal bleeding    Recommendations/Plan: Continue full comfort measures Continue dilaudid infusion Wean oxygen down as tolerated Anticipate hospital death  Code Status: DNR/DNI   Prognosis:  Hours - Days    Thank you for allowing the Palliative Medicine Team to assist in the care of this patient.   MDM - High   Lavena Bullion, NP   Please contact Palliative Medicine Team phone at [phone] for questions and concerns.  For individual providers, please see AMION.

## 2022-04-19 NOTE — Progress Notes (Signed)
Patient with no heart beat, respirations, signs of life.  Time of death 08-06-1906, confirmed by this RN and Hendricks Milo, RN.  Family at bedside.  Emotional support given to family.  [facility] called, spoke with Barbette Or, referral number [phone].  Pt not suitable for organ, tissue, or eye donation.  Ok to release to funeral home.  Belongings returned to family.  Dr. Joneen Roach notified.

## 2022-04-19 NOTE — Progress Notes (Signed)
Pt removed from [facility] and placed on room air with family at bedside. Pt appears comfortable at this time.

## 2022-04-19 NOTE — Death Summary Note (Addendum)
DEATH SUMMARY   Patient Details  Name: Kara [name] MRN: [id number] DOB: [birth year] YOM:AYOK, Dwyane Luo, MD Admission/Discharge Information   Admit Date:  04/12/2022  Date of Death: Date of Death: Apr 07, 2022  Time of Death: Time of Death: 08-Jun-1906  Length of Stay: 6   Principle Cause of death: Acute Liver failure   Hospital Diagnoses:   Acute liver failure with decompensated hepatic cirrhosis  Alcoholic cirrhosis of liver with ascites (HCC)   Encephalopathy, hepatic (HCC)   Alcoholic hepatitis with ascites  Rectal bleeding  Hyperbilirubinemia with jaundice   Chronic diastolic heart failure ([place])  Acute respiratory failure with hypoxia with ARDS  SIRS  History of cardiac arrest   Seizure disorder ([place])   Tracheostomy status ([place])   Anoxic brain injury ([place])  Asthma  Acute kidney injury       Hospital Course: Patient was a 37 year old female with history of remote EtOH use, sober for the past 6 years, chronic pancreatitis, 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  She also noticed that she was becoming more jaundiced in the last  week prior to admission, reported occasionally subjective fevers at home.      Assessment and Plan:  Acute liver failure with decompensated hepatic cirrhosis due to ongoing EtOH use -Presumed diagnosis is liver failure from alcoholic hepatitis, underlying cirrhosis.  Not a transplant candidate due to ongoing alcohol use.  Hospitalization complicated with acute respiratory failure with hypoxia, suspected ARDS, rectal bleeding -GI was consulted, was placed on prednisolone, rifaximin, lactulose, vitamin K -Liver Doppler without significant  thrombosis. -Started on prednisolone on 12/10, poor prognosis -Discussions were held with family by gastroenterology, pulmonary critical care, palliative medicine and comfort care status was pursued per her wishes.     Acute respiratory failure with hypoxia with ARDS -respiratory status was tenuous, suspected ARDS -Patient was placed on Lasix 20 mg IV twice daily -Chest x-ray on 12/13 showed bilateral patchy airspace disease concerning for multilobar pneumonia, on antibiotics, respiratory status continued to decline.  Pulmonology was consulted.  After extensive discussion with the patient's family, proceed with comfort care per their wishes.   -Patient was started on comfort measures     SIRS -Met SIRS criteria with tachycardiac hypoxic, tachypneic, leukocytosis on admission     Acute kidney injury -Creatinine currently stable, Lasix was held on 12/12 -Was placed on midodrine, received albumin on 12/12 -Comfort care status   Hepatic encephalopathy -Patient was placed on lactulose, rifaximin, currently comfort care   Coagulopathy, thrombocytopenia -Due to liver disease     Hypokalemia -Was replaced IV   Hypernatremia    GI bleed -Hemoglobin 9.8, however having abdominal pain and rectal bleeding, GI was consulted and had recommended CTA abdomen however given declining MOF and respiratory failure, comfort care goals were pursued with patient's family's wishes.    History of seizure disorder, asthma, hypertension -Continue comfort care goals   Anxiety, depression Comfort care status   QT prolongation - History of this but QTc normal on EKG in ED.   Chronic diastolic CHF -2D echo 08/9975 showed EF of 60 to 41%, normal diastolic dysfunction, normal RV function -Patient was placed on low-dose Lasix IV for diuresis and volume overload/ARDS   History of cardiac arrest August 2023, mild anoxic brain  injury, ataxia  - History of cardiac arrest in July with evidence of anoxic  brain injury on imaging at that time as well some ataxia, trach at that time, now decannulated.    Goals of care -Extensive discussion with patient's family on 12/13 by myself and CCM, Dr. Carlis Abbott, placed on comfort care status per her wishes   Obesity Estimated body mass index is 35.11 kg/m as calculated from the following:   Height as of this encounter: _0  (1.651 m).   Weight as of this encounter: 95.7 kg.  Patient passed on 2022/04/15 at 7:08PM       Consultations: GI, palliative medicine, critical care  The results of significant diagnostics from this hospitalization (including imaging, microbiology, ancillary and laboratory) are listed below for reference.   Significant Diagnostic Studies: DG CHEST PORT 1 VIEW  Result Date: 03/31/2022 CLINICAL DATA:  . dyspnea EXAM: PORTABLE CHEST 1 VIEW COMPARISON:  03/19/2022 FINDINGS: Bilateral patchy airspace disease. Small left pleural effusion. No right pleural effusion. No pneumothorax. Stable cardiomediastinal silhouette. No aggressive osseous lesion. IMPRESSION: 1. Bilateral patchy airspace disease concerning for multilobar pneumonia. Electronically Signed   By: Kathreen Devoid M.D.   On: 03/31/2022 09:17   DG CHEST PORT 1 VIEW  Result Date: 03/29/2022 CLINICAL DATA:  37 year old female with hypoxemia EXAM: PORTABLE CHEST 1 VIEW COMPARISON:  03/28/2022 FINDINGS: Cardiomediastinal silhouette likely unchanged, with the heart borders partially obscured by overlying lung/pleural disease. Low lung volumes persist with asymmetric elevation the right hemidiaphragm. Bilateral mixed interstitial and airspace disease persists, relatively unchanged from the prior. No pneumothorax.  No large pleural effusion IMPRESSION: Similar appearance of bilateral interstitial and airspace disease, with persisting cardiomegaly Electronically Signed   By: Corrie Mckusick D.O.   On: 03/29/2022 08:10   DG CHEST PORT 1 VIEW  Result Date: 03/28/2022 CLINICAL DATA:   Hypoxemia. EXAM: PORTABLE CHEST 1 VIEW COMPARISON:  12/07/2021 FINDINGS: Low volume film. The cardio pericardial silhouette is enlarged. Patchy bilateral airspace disease evident. No substantial pleural effusion. The visualized bony structures of the thorax are unremarkable. Telemetry leads overlie the chest. IMPRESSION: Low volume film with patchy bilateral airspace disease. Imaging features suggest multifocal pneumonia. Electronically Signed   By: Misty Stanley M.D.   On: 03/28/2022 13:21   US ABDOMEN LIMITED WITH LIVER DOPPLER  Result Date: 03/27/2022 CLINICAL DATA:  Acute liver injury EXAM: DUPLEX ULTRASOUND OF LIVER TECHNIQUE: Color and duplex Doppler ultrasound was performed to evaluate the hepatic in-flow and out-flow vessels. COMPARISON:  CT abdomen and pelvis dated 03/29/2022 FINDINGS: Liver: Heterogeneous echogenic parenchyma. Normal hepatic contour without nodularity. No focal lesion, mass or intrahepatic biliary ductal dilatation. Main Portal Vein size: 1.7 cm Portal Vein Velocities Main Prox:  12.4 cm/sec Main Mid: 13.6 cm/sec Main Dist:  12.2 cm/sec Right: 12.9 cm/sec Left: 5.7 cm/sec, apparently reversed direction Hepatic Vein Velocities Right:  79.5 cm/sec Middle:  47.9 cm/sec Left:  5.7 cm/sec IVC: Present and patent with normal respiratory phasicity. Hepatic Artery Velocity:  60.6 cm/sec Splenic Vein Velocity:  23.5 cm/sec Spleen: 15.3 cm x 11.8 cm x 5.3 cm with a total volume of 518.6 cm^3 (411 cm^3 is upper limit normal) Portal Vein Occlusion/Thrombus: No Splenic Vein Occlusion/Thrombus: No Ascites: Small volume ascites. Varices: None IMPRESSION: 1. Technically challenging examination for evaluation of the portal venous flow, however hepatic vasculature appears patent with appropriate direction of flow with the exception of apparent reversal of flow in the left portal vein, suggestive of portal hypertension. 2. Heterogeneously echogenic hepatic parenchyma  likely reflects acute hepatitis. 3.  Small volume ascites. 4. Splenomegaly. Electronically Signed   By: Darrin Nipper M.D.   On: 03/27/2022 11:39   US Paracentesis  Result Date: 03/27/2022 INDICATION: Cirrhosis with ascites. Request for diagnostic and therapeutic paracentesis. EXAM: ULTRASOUND GUIDED RIGHT LOWER QUADRANT PARACENTESIS MEDICATIONS: 1% plain lidocaine, 5 mL COMPLICATIONS: None immediate. PROCEDURE: Informed written consent was obtained from the patient after a discussion of the risks, benefits and alternatives to treatment. A timeout was performed prior to the initiation of the procedure. Initial ultrasound scanning demonstrates a moderate amount of ascites within the right lower abdominal quadrant. The right lower abdomen was prepped and draped in the usual sterile fashion. 1% lidocaine was used for local anesthesia. Following this, a 19 gauge, 7-cm, Yueh catheter was introduced. An ultrasound image was saved for documentation purposes. The paracentesis was performed. The catheter was removed and a dressing was applied. The patient tolerated the procedure well without immediate post procedural complication. FINDINGS: A total of approximately 1810 mL of clear yellow fluid was removed. Samples were sent to the laboratory as requested by the clinical team. IMPRESSION: Successful ultrasound-guided paracentesis yielding 1.8 liters of peritoneal fluid. Read by: Ascencion Dike PA-C PLAN: If the patient eventually requires >/=2 paracenteses in a 30 day period, candidacy for formal evaluation by the [facility] will be assessed. Electronically Signed   By: Ruthann Cancer M.D.   On: 03/27/2022 11:26   CT ABDOMEN PELVIS W CONTRAST  Result Date: 04/03/2022 CLINICAL DATA:  Right lower quadrant abdominal pain EXAM: CT ABDOMEN AND PELVIS WITH CONTRAST TECHNIQUE: Multidetector CT imaging of the abdomen and pelvis was performed using the standard protocol following bolus administration of intravenous  contrast. RADIATION DOSE REDUCTION: This exam was performed according to the departmental dose-optimization program which includes automated exposure control, adjustment of the mA and/or kV according to patient size and/or use of iterative reconstruction technique. CONTRAST:  33m OMNIPAQUE IOHEXOL 350 MG/ML SOLN COMPARISON:  CT abdomen dated December 01, 2021 FINDINGS: Lower chest: Right lower lobe atelectasis.  Small hiatal hernia. Hepatobiliary: Heterogeneous enhancement of the liver parenchyma, similar to prior exam and likely due to hepatic steatosis. New low-attenuation lesion of the right lobe of the liver measuring 8 mm on series 3, image 44. Gallbladder is surgically absent. Pancreas: No pancreatic duct dilation. Mild peripancreatic fat stranding, increased when compared with the prior exam. Spleen: Unchanged splenomegaly Adrenals/Urinary Tract: Bilateral adrenal glands are unremarkable. No hydronephrosis or nephrolithiasis. Bladder is unremarkable. Stomach/Bowel: Stomach is within normal limits. No evidence of bowel wall thickening, distention, or inflammatory changes. Vascular/Lymphatic: No significant vascular findings are present. No enlarged abdominal or pelvic lymph nodes. Reproductive: Uterus and bilateral adnexa are unremarkable. Other: Small collection located posterior to the wall of the ascending colon measuring 3.5 x 2.1 cm on series 3 image 57, unchanged when compared with prior exam and likely sequela of prior retroperitoneal hematoma. Musculoskeletal: No acute or significant osseous findings. IMPRESSION: 1. Mild peripancreatic fat stranding, increased when compared with the prior exam, findings may be due to acute pancreatitis but could also be related to mesenteric edema. Correlate with lipase. 2. Sequela of portal hypertension including splenomegaly and increased large volume abdominal ascites. 3. New low-attenuation lesion of the right lobe of the liver measuring 8 mm, may be due to a simple  cyst, however since finding is new when compared with prior exam further evaluation is recommended with contrast-enhanced liver MRI. This can be performed non emergently. Electronically  Signed   By: Yetta Glassman M.D.   On: 03/27/2022 17:17    Microbiology: Recent Results (from the past 240 hour(s))  Blood culture (routine x 2)     Status: None   Collection Time: 04/16/2022  2:41 PM   Specimen: BLOOD  Result Value Ref Range Status   Specimen Description BLOOD SITE NOT SPECIFIED  Final   Special Requests   Final    BOTTLES DRAWN AEROBIC AND ANAEROBIC Blood Culture results may not be optimal due to an inadequate volume of blood received in culture bottles   Culture   Final    NO GROWTH 5 DAYS Performed at [facility], [place] [address] [place], [place] [address]    Report Status 03/31/2022 FINAL  Final  MRSA Next Gen by PCR, Nasal     Status: None   Collection Time: 03/20/2022 11:54 PM   Specimen: Nasal Mucosa; Nasal Swab  Result Value Ref Range Status   MRSA by PCR Next Gen NOT DETECTED NOT DETECTED Final    Comment: (NOTE) The GeneXpert MRSA Assay (FDA approved for NASAL specimens only), is one component of a comprehensive MRSA colonization surveillance program. It is not intended to diagnose MRSA infection nor to guide or monitor treatment for MRSA infections. Test performance is not FDA approved in patients less than 42 years old. Performed at [facility], [place] [address] [place], [place] [address]   C Difficile Quick Screen w PCR reflex     Status: None   Collection Time: 03/27/22  7:23 AM   Specimen: STOOL  Result Value Ref Range Status   C Diff antigen NEGATIVE NEGATIVE Final   C Diff toxin NEGATIVE NEGATIVE Final   C Diff interpretation No C. difficile detected.  Final    Comment: Performed at [facility], [place] [address], [place], [place] [address]  Gastrointestinal Panel by PCR , Stool     Status: None   Collection Time: 03/27/22  7:23 AM   Specimen:  STOOL  Result Value Ref Range Status   Campylobacter species NOT DETECTED NOT DETECTED Final   Plesimonas shigelloides NOT DETECTED NOT DETECTED Final   Salmonella species NOT DETECTED NOT DETECTED Final   Yersinia enterocolitica NOT DETECTED NOT DETECTED Final   Vibrio species NOT DETECTED NOT DETECTED Final   Vibrio cholerae NOT DETECTED NOT DETECTED Final   Enteroaggregative E coli (EAEC) NOT DETECTED NOT DETECTED Final   Enteropathogenic E coli (EPEC) NOT DETECTED NOT DETECTED Final   Enterotoxigenic E coli (ETEC) NOT DETECTED NOT DETECTED Final   Shiga like toxin producing E coli (STEC) NOT DETECTED NOT DETECTED Final   Shigella/Enteroinvasive E coli (EIEC) NOT DETECTED NOT DETECTED Final   Cryptosporidium NOT DETECTED NOT DETECTED Final   Cyclospora cayetanensis NOT DETECTED NOT DETECTED Final   Entamoeba histolytica NOT DETECTED NOT DETECTED Final   Giardia lamblia NOT DETECTED NOT DETECTED Final   Adenovirus F40/41 NOT DETECTED NOT DETECTED Final   Astrovirus NOT DETECTED NOT DETECTED Final   Norovirus GI/GII NOT DETECTED NOT DETECTED Final   Rotavirus A NOT DETECTED NOT DETECTED Final   Sapovirus (I, II, IV, and V) NOT DETECTED NOT DETECTED Final    Comment: Performed at [facility], [place] [address] [place], [place] [address]  Culture, body fluid w Gram Stain-bottle     Status: None   Collection Time: 03/27/22  9:28 AM   Specimen: Peritoneal Washings  Result Value Ref Range Status   Specimen  Description PERITONEAL  Final   Special Requests NONE  Final   Culture   Final    NO GROWTH 5 DAYS Performed at [place] [address] [place], [place] [address]    Report Status 2022-04-05 FINAL  Final  Gram stain     Status: None   Collection Time: 03/27/22  9:28 AM   Specimen: Peritoneal Washings  Result Value Ref Range Status   Specimen Description PERITONEAL  Final   Special Requests NONE  Final   Gram Stain   Final    NO ORGANISMS SEEN NO WBC  SEEN Performed at [facility], [place] [address] [place], [place] [address]    Report Status 03/27/2022 FINAL  Final  SARS Coronavirus 2 by RT PCR (hospital order, performed in [facility] hospital lab) *cepheid single result test* Anterior Nasal Swab     Status: None   Collection Time: 03/28/22 10:57 AM   Specimen: Anterior Nasal Swab  Result Value Ref Range Status   SARS Coronavirus 2 by RT PCR NEGATIVE NEGATIVE Final    Comment: (NOTE) SARS-CoV-2 target nucleic acids are NOT DETECTED.  The SARS-CoV-2 RNA is generally detectable in upper and lower respiratory specimens during the acute phase of infection. The lowest concentration of SARS-CoV-2 viral copies this assay can detect is 250 copies / mL. A negative result does not preclude SARS-CoV-2 infection and should not be used as the sole basis for treatment or other patient management decisions.  A negative result may occur with improper specimen collection / handling, submission of specimen other than nasopharyngeal swab, presence of viral mutation(s) within the areas targeted by this assay, and inadequate number of viral copies (<250 copies / mL). A negative result must be combined with clinical observations, patient history, and epidemiological information.  Fact Sheet for Patients:   https://www.patel.info/  Fact Sheet for Healthcare Providers: https://hall.com/  This test is not yet approved or  cleared by the [place] FDA and has been authorized for detection and/or diagnosis of SARS-CoV-2 by FDA under an Emergency Use Authorization (EUA).  This EUA will remain in effect (meaning this test can be used) for the duration of the COVID-19 declaration under Section 564(b)(1) of the Act, 21 U.S.C. section 360bbb-3(b)(1), unless the authorization is terminated or revoked sooner.  Performed at [facility], [place] [address] [place], [place] [address]   Respiratory (~20  pathogens) panel by PCR     Status: None   Collection Time: 03/28/22 10:57 AM   Specimen: Nasopharyngeal Swab; Respiratory  Result Value Ref Range Status   Adenovirus NOT DETECTED NOT DETECTED Final   Coronavirus 229E NOT DETECTED NOT DETECTED Final    Comment: (NOTE) The Coronavirus on the Respiratory Panel, DOES NOT test for the novel  Coronavirus (2019 nCoV)    Coronavirus HKU1 NOT DETECTED NOT DETECTED Final   Coronavirus NL63 NOT DETECTED NOT DETECTED Final   Coronavirus OC43 NOT DETECTED NOT DETECTED Final   Metapneumovirus NOT DETECTED NOT DETECTED Final   Rhinovirus / Enterovirus NOT DETECTED NOT DETECTED Final   Influenza A NOT DETECTED NOT DETECTED Final   Influenza B NOT DETECTED NOT DETECTED Final   Parainfluenza Virus 1 NOT DETECTED NOT DETECTED Final   Parainfluenza Virus 2 NOT DETECTED NOT DETECTED Final   Parainfluenza Virus 3 NOT DETECTED NOT DETECTED Final   Parainfluenza Virus 4 NOT DETECTED NOT DETECTED Final   Respiratory Syncytial Virus NOT DETECTED NOT DETECTED Final   Bordetella pertussis  NOT DETECTED NOT DETECTED Final   Bordetella Parapertussis NOT DETECTED NOT DETECTED Final   Chlamydophila pneumoniae NOT DETECTED NOT DETECTED Final   Mycoplasma pneumoniae NOT DETECTED NOT DETECTED Final    Comment: Performed at [facility], [place] [address], [place], [place] [address]     Signed: Estill Cotta, MD

## 2022-04-19 DEATH — deceased

## 2022-05-03 ENCOUNTER — Ambulatory Visit: Payer: BC Managed Care – PPO | Admitting: Cardiovascular Disease

## 2022-06-09 ENCOUNTER — Ambulatory Visit: Payer: BC Managed Care – PPO | Admitting: Neurology

## 2022-08-11 ENCOUNTER — Ambulatory Visit: Payer: BC Managed Care – PPO | Admitting: Psychology

## 2022-11-02 ENCOUNTER — Ambulatory Visit: Payer: BC Managed Care – PPO | Admitting: Neurology
